# Patient Record
Sex: Male | Born: 1945 | Race: Black or African American | Hispanic: No | Marital: Married | Smoking: Never smoker
Health system: Southern US, Community
[De-identification: ages and names within clinical notes are randomized; demographics above are authoritative.]

## PROBLEM LIST (undated history)

## (undated) DIAGNOSIS — I890 Lymphedema, not elsewhere classified: Secondary | ICD-10-CM

## (undated) DIAGNOSIS — N189 Chronic kidney disease, unspecified: Secondary | ICD-10-CM

## (undated) DIAGNOSIS — R252 Cramp and spasm: Secondary | ICD-10-CM

## (undated) DIAGNOSIS — I82409 Acute embolism and thrombosis of unspecified deep veins of unspecified lower extremity: Secondary | ICD-10-CM

## (undated) DIAGNOSIS — G479 Sleep disorder, unspecified: Secondary | ICD-10-CM

## (undated) DIAGNOSIS — I8390 Asymptomatic varicose veins of unspecified lower extremity: Secondary | ICD-10-CM

## (undated) DIAGNOSIS — M7989 Other specified soft tissue disorders: Secondary | ICD-10-CM

## (undated) DIAGNOSIS — D649 Anemia, unspecified: Secondary | ICD-10-CM

## (undated) DIAGNOSIS — I10 Essential (primary) hypertension: Secondary | ICD-10-CM

## (undated) DIAGNOSIS — Z973 Presence of spectacles and contact lenses: Secondary | ICD-10-CM

## (undated) DIAGNOSIS — C61 Malignant neoplasm of prostate: Secondary | ICD-10-CM

## (undated) DIAGNOSIS — M1990 Unspecified osteoarthritis, unspecified site: Secondary | ICD-10-CM

## (undated) HISTORY — PX: VEIN SURGERY: SHX48

## (undated) HISTORY — PX: NASAL SINUS SURGERY: SHX719

## (undated) HISTORY — PX: PROSTATE BIOPSY: SHX241

---

## 2009-06-06 ENCOUNTER — Ambulatory Visit: Payer: Self-pay | Admitting: Vascular Surgery

## 2009-08-07 ENCOUNTER — Ambulatory Visit: Payer: Self-pay | Admitting: Vascular Surgery

## 2010-01-07 ENCOUNTER — Ambulatory Visit: Payer: Self-pay | Admitting: Vascular Surgery

## 2010-01-07 ENCOUNTER — Encounter (INDEPENDENT_AMBULATORY_CARE_PROVIDER_SITE_OTHER): Payer: Self-pay | Admitting: Emergency Medicine

## 2010-01-07 ENCOUNTER — Emergency Department (HOSPITAL_COMMUNITY): Admission: EM | Admit: 2010-01-07 | Discharge: 2010-01-07 | Payer: Self-pay | Admitting: Emergency Medicine

## 2010-03-14 ENCOUNTER — Ambulatory Visit: Payer: Self-pay | Admitting: Surgery

## 2010-05-19 HISTORY — PX: HERNIA REPAIR: SHX51

## 2010-08-02 LAB — BASIC METABOLIC PANEL
CO2: 29 mEq/L (ref 19–32)
Calcium: 9.6 mg/dL (ref 8.4–10.5)
Creatinine, Ser: 1.31 mg/dL (ref 0.4–1.5)
GFR calc Af Amer: >60 mL/min (ref >60)

## 2010-08-02 LAB — DIFFERENTIAL
Basophils Relative: 0 % (ref 0–1)
Eosinophils Absolute: 0 10*3/uL (ref 0.0–0.7)
Lymphs Abs: 1.6 10*3/uL (ref 0.7–4.0)
Neutrophils Relative %: 72 % (ref 43–77)

## 2010-08-02 LAB — PROTIME-INR
INR: 2.62 — ABNORMAL HIGH (ref 0.00–1.49)
Prothrombin Time: 28.1 seconds — ABNORMAL HIGH (ref 11.6–15.2)

## 2010-08-02 LAB — CBC
MCH: 29.6 pg (ref 26.0–34.0)
Platelets: 138 10*3/uL — ABNORMAL LOW (ref 150–400)
RBC: 4.32 MIL/uL (ref 4.22–5.81)

## 2010-10-01 NOTE — Procedures (Signed)
DUPLEX DEEP VENOUS EXAM - LOWER EXTREMITY   INDICATION:  Right lower extremity pain and swelling with known history  of DVT.   HISTORY:  Edema:  Right lower extremity intermittently.  Trauma/Surgery:  Not recently, history of right varicose vein stripping,  per patient.  Pain:  Right lower extremity aching intermittently.  PE:  No.  Previous DVT:  Right popliteal vein DVT on 06/06/2009.  Anticoagulants:  Coumadin.  Other:   DUPLEX EXAM:                CFV   SFV   PopV  PTV    GSV                R  L  R  L  R  L  R   L  R   L  Thrombosis    o  o  o     +     o      N/A  Spontaneous   +  +  +     +     +      N/A  Phasic        +  +  +     +     +      N/A  Augmentation  +  +  +     p     +      N/A  Compressible  +  +  +     p     +      N/A  Competent     +  +  +     0     +      N/A   Legend:  + - yes  o - no  p - partial  D - decreased   IMPRESSION:  1. 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  2. All other imaged deep veins appear patent.   Called the office of Dr. Bascom Levels; however, was closed for lunch, so  preliminary report was faxed.    _____________________________  Quita Skye Hart [place], M.D.   AS/MEDQ  D:  08/07/2009  T:  08/08/2009  Job:  [id number]

## 2010-10-01 NOTE — Procedures (Signed)
DUPLEX DEEP VENOUS EXAM - LOWER EXTREMITY   INDICATION:  Right lower extremity pain and edema with history of deep  venous thrombosis.   HISTORY:  Edema:  Right lower extremity.  Trauma/Surgery:  Right lower extremity vein stripping 20 years ago.  Pain:  Yes.  PE:  No.  Previous DVT:  Right popliteal vein DVT in January, 2011.  Anticoagulants:  On Coumadin.  Other:   DUPLEX EXAM:                CFV   SFV   PopV  PTV    GSV                R  L  R  L  R  L  R   L  R  L  Thrombosis    o  o  o     P     o  Spontaneous   +  +  +     +     +  Phasic        +  +  +     D     +  Augmentation  +  +  +     D     +  Compressible  +  +  +     D     +  Competent     +  +  +   Legend:  + - yes  o - no  p - partial  D - decreased   IMPRESSION:  Persistent partial obstruction of the right popliteal vein.  Echogenic thrombus within the lumen suggests chronic, rather than acute,  thrombosis.    _____________________________  V. Charlena Cross, MD   RS/MEDQ  D:  03/14/2010  T:  03/14/2010  Job:  [id number]

## 2010-10-01 NOTE — Procedures (Signed)
DUPLEX DEEP VENOUS EXAM - LOWER EXTREMITY   INDICATION:  Right lower extremity pain and swelling.   HISTORY:  Edema:  Yes.  Trauma/Surgery:  Not current.  Pain:  Pain.  PE:  No.  Previous DVT:  Yes.  Anticoagulants:  Yes.  Other:   DUPLEX EXAM:                CFV   SFV   PopV  PTV    GSV                R  L  R  L  R  L  R   L  R   L  Thrombosis    0  0  0     +     0      NA  Spontaneous   +  +  +     0     +      NA  Phasic        +  +  +     P     +      NA  Augmentation  +  +  +     P     +      NA  Compressible  +  +  +     0     +      NA  Competent     +  +  +     0     +      NA   Legend:  + - yes  o - no  p - partial  D - decreased   IMPRESSION:  Distal popliteal remains uncompressible, with partial flow  and augmentation.  All other veins are compressible, patent, spontaneous  with augmentation.    _____________________________  Janetta Hora Fields, MD   CJ/MEDQ  D:  06/06/2009  T:  06/06/2009  Job:  [id number]

## 2011-02-03 ENCOUNTER — Other Ambulatory Visit (HOSPITAL_COMMUNITY): Payer: Self-pay | Admitting: Urology

## 2011-02-03 DIAGNOSIS — R972 Elevated prostate specific antigen [PSA]: Secondary | ICD-10-CM

## 2011-02-11 ENCOUNTER — Ambulatory Visit (HOSPITAL_COMMUNITY)
Admission: RE | Admit: 2011-02-11 | Discharge: 2011-02-11 | Disposition: A | Discharge status: Home / Self Care | Payer: Medicare Other | Source: Ambulatory Visit | Attending: Urology | Admitting: Urology

## 2011-02-11 DIAGNOSIS — R972 Elevated prostate specific antigen [PSA]: Secondary | ICD-10-CM | POA: Insufficient documentation

## 2011-02-11 DIAGNOSIS — N400 Enlarged prostate without lower urinary tract symptoms: Secondary | ICD-10-CM | POA: Insufficient documentation

## 2011-02-11 LAB — CREATININE, SERUM: GFR calc non Af Amer: >60 mL/min (ref >60)

## 2011-02-11 MED ORDER — GADOBENATE DIMEGLUMINE 529 MG/ML IV SOLN
20.0000 mL | Freq: Once | INTRAVENOUS | Status: AC | PRN
  Administered 2011-02-11: 20 mL via INTRAVENOUS

## 2012-11-10 ENCOUNTER — Encounter (INDEPENDENT_AMBULATORY_CARE_PROVIDER_SITE_OTHER): Payer: Medicare Other

## 2012-11-10 DIAGNOSIS — Z86718 Personal history of other venous thrombosis and embolism: Secondary | ICD-10-CM

## 2012-11-10 DIAGNOSIS — I87009 Postthrombotic syndrome without complications of unspecified extremity: Secondary | ICD-10-CM

## 2012-11-10 DIAGNOSIS — M79609 Pain in unspecified limb: Secondary | ICD-10-CM

## 2012-12-31 ENCOUNTER — Institutional Professional Consult (permissible substitution): Payer: BC Managed Care – PPO | Admitting: Internal Medicine

## 2013-03-04 ENCOUNTER — Encounter: Payer: Self-pay | Admitting: Internal Medicine

## 2013-03-07 ENCOUNTER — Encounter: Payer: Medicare Other | Admitting: Internal Medicine

## 2013-03-07 ENCOUNTER — Encounter: Payer: Self-pay | Admitting: Internal Medicine

## 2013-03-07 NOTE — Progress Notes (Deleted)
Error

## 2013-03-18 NOTE — Progress Notes (Signed)
This encounter was created in error - please disregard.

## 2013-09-16 ENCOUNTER — Other Ambulatory Visit (HOSPITAL_COMMUNITY): Payer: Self-pay | Admitting: Urology

## 2013-09-16 DIAGNOSIS — C61 Malignant neoplasm of prostate: Secondary | ICD-10-CM

## 2013-09-27 ENCOUNTER — Encounter (HOSPITAL_COMMUNITY)
Admission: RE | Admit: 2013-09-27 | Discharge: 2013-09-27 | Disposition: A | Discharge status: Home / Self Care | Payer: Medicare Other | Source: Ambulatory Visit | Attending: Urology | Admitting: Urology

## 2013-09-27 DIAGNOSIS — C61 Malignant neoplasm of prostate: Secondary | ICD-10-CM | POA: Insufficient documentation

## 2013-09-27 MED ORDER — TECHNETIUM TC 99M MEDRONATE IV KIT
25.0000 | PACK | Freq: Once | INTRAVENOUS | Status: AC | PRN
  Administered 2013-09-27: 25 via INTRAVENOUS

## 2013-09-27 MED ORDER — FLUDEOXYGLUCOSE F - 18 (FDG) INJECTION: 25.0000 | Freq: Once | INTRAVENOUS | Status: DC | PRN

## 2013-11-29 ENCOUNTER — Other Ambulatory Visit: Payer: Self-pay | Admitting: Urology

## 2013-12-19 ENCOUNTER — Encounter (HOSPITAL_COMMUNITY): Payer: Self-pay | Admitting: Pharmacy Technician

## 2013-12-21 ENCOUNTER — Other Ambulatory Visit (HOSPITAL_COMMUNITY): Payer: Self-pay | Admitting: *Deleted

## 2013-12-21 ENCOUNTER — Encounter (HOSPITAL_COMMUNITY)
Admission: RE | Admit: 2013-12-21 | Discharge: 2013-12-21 | Disposition: A | Discharge status: Home / Self Care | Payer: Medicare Other | Source: Ambulatory Visit | Attending: Urology | Admitting: Urology

## 2013-12-21 ENCOUNTER — Ambulatory Visit (HOSPITAL_COMMUNITY)
Admission: RE | Admit: 2013-12-21 | Discharge: 2013-12-21 | Disposition: A | Discharge status: Home / Self Care | Payer: Medicare Other | Source: Ambulatory Visit | Attending: Anesthesiology | Admitting: Anesthesiology

## 2013-12-21 ENCOUNTER — Encounter (HOSPITAL_COMMUNITY): Payer: Self-pay

## 2013-12-21 ENCOUNTER — Encounter (INDEPENDENT_AMBULATORY_CARE_PROVIDER_SITE_OTHER): Payer: Self-pay

## 2013-12-21 DIAGNOSIS — I10 Essential (primary) hypertension: Secondary | ICD-10-CM | POA: Diagnosis not present

## 2013-12-21 DIAGNOSIS — Z01812 Encounter for preprocedural laboratory examination: Secondary | ICD-10-CM | POA: Insufficient documentation

## 2013-12-21 DIAGNOSIS — Z01818 Encounter for other preprocedural examination: Secondary | ICD-10-CM | POA: Diagnosis not present

## 2013-12-21 DIAGNOSIS — Z8546 Personal history of malignant neoplasm of prostate: Secondary | ICD-10-CM | POA: Insufficient documentation

## 2013-12-21 DIAGNOSIS — J9819 Other pulmonary collapse: Secondary | ICD-10-CM | POA: Insufficient documentation

## 2013-12-21 HISTORY — DX: Malignant neoplasm of prostate: C61

## 2013-12-21 HISTORY — DX: Other specified soft tissue disorders: M79.89

## 2013-12-21 HISTORY — DX: Sleep disorder, unspecified: G47.9

## 2013-12-21 HISTORY — DX: Acute embolism and thrombosis of unspecified deep veins of unspecified lower extremity: I82.409

## 2013-12-21 HISTORY — DX: Essential (primary) hypertension: I10

## 2013-12-21 HISTORY — DX: Cramp and spasm: R25.2

## 2013-12-21 HISTORY — DX: Asymptomatic varicose veins of unspecified lower extremity: I83.90

## 2013-12-21 LAB — APTT: APTT: 35 s (ref 24–37)

## 2013-12-21 LAB — CBC
HCT: 38.2 % — ABNORMAL LOW (ref 39.0–52.0)
Hemoglobin: 12.2 g/dL — ABNORMAL LOW (ref 13.0–17.0)
MCH: 28.4 pg (ref 26.0–34.0)
MCHC: 31.9 g/dL (ref 30.0–36.0)
MCV: 89 fL (ref 78.0–100.0)
PLATELETS: 125 10*3/uL — AB (ref 150–400)
RBC: 4.29 MIL/uL (ref 4.22–5.81)
RDW: 15.2 % (ref 11.5–15.5)
WBC: 5.9 10*3/uL (ref 4.0–10.5)

## 2013-12-21 LAB — BASIC METABOLIC PANEL
ANION GAP: 10 (ref 5–15)
BUN: 23 mg/dL (ref 6–23)
CALCIUM: 9.8 mg/dL (ref 8.4–10.5)
CO2: 26 mEq/L (ref 19–32)
CREATININE: 1.2 mg/dL (ref 0.50–1.35)
Chloride: 103 mEq/L (ref 96–112)
GFR, EST AFRICAN AMERICAN: 69 mL/min — AB (ref >90)
GFR, EST NON AFRICAN AMERICAN: 60 mL/min — AB (ref >90)
Glucose, Bld: 97 mg/dL (ref 70–99)
Potassium: 4.6 mEq/L (ref 3.7–5.3)
SODIUM: 139 meq/L (ref 137–147)

## 2013-12-21 LAB — PROTIME-INR
INR: 2.58 — AB (ref 0.00–1.49)
PROTHROMBIN TIME: 27.7 s — AB (ref 11.6–15.2)

## 2013-12-21 NOTE — Progress Notes (Signed)
12/21/13 1322  OBSTRUCTIVE SLEEP APNEA  Have you ever been diagnosed with sleep apnea through a sleep study? No  Do you snore loudly (loud enough to be heard through closed doors)?  1  Do you often feel tired, fatigued, or sleepy during the daytime? 1  Has anyone observed you stop breathing during your sleep? 0  Do you have, or are you being treated for high blood pressure? 1  BMI more than 35 kg/m2? 0  Age over 70 years old? 1  Neck circumference greater than 40 cm/16 inches? 1  Gender: 1  Obstructive Sleep Apnea Score 6  Score 4 or greater  Results sent to PCP

## 2013-12-21 NOTE — Patient Instructions (Addendum)
Yair Dusza  12/21/2013                           YOUR PROCEDURE IS SCHEDULED ON: 12/30/13               ENTER THRU [facility] ENTRANCE AND                          FOLLOW  SIGNS TO SHORT STAY CENTER                 ARRIVE AT SHORT STAY AT: 5:30 AM               CALL THIS NUMBER IF ANY PROBLEMS THE DAY OF SURGERY :               [phone]                                REMEMBER:   Do not eat food or drink liquids AFTER MIDNIGHT                  Take these medicines the morning of surgery with               A SIPS OF WATER : NONE        Do not wear jewelry, make-up   Do not wear lotions, powders, or perfumes.   Do not shave legs or underarms 12 hrs. before surgery (men may shave face)  Do not bring valuables to the hospital.  Contacts, dentures or bridgework may not be worn into surgery.  Leave suitcase in the car. After surgery it may be brought to your room.  For patients admitted to the hospital more than one night, checkout time is            11:00 AM                                                      ________________________________________________________________________                                                                        [place]  Before surgery, you can play an important role.  Because skin is not sterile, your skin needs to be as free of germs as possible.  You can reduce the number of germs on your skin by washing with CHG (chlorahexidine gluconate) soap before surgery.  CHG is an antiseptic cleaner which kills germs and bonds with the skin to continue killing germs even after washing. Please DO NOT use if you have an allergy to CHG or antibacterial soaps.  If your skin becomes reddened/irritated stop using the CHG and inform your nurse when you arrive at Short Stay. Do not shave (including legs and underarms) for at least 48 hours prior to the first CHG shower.  You may shave your  face. Please follow these instructions  carefully:   1.  Shower with CHG Soap the night before surgery and the  morning of Surgery.   2.  If you choose to wash your hair, wash your hair first as usual with your  normal  Shampoo.   3.  After you shampoo, rinse your hair and body thoroughly to remove the  shampoo.                                         4.  Use CHG as you would any other liquid soap.  You can apply chg directly  to the skin and wash . Gently wash with scrungie or clean wascloth    5.  Apply the CHG Soap to your body ONLY FROM THE NECK DOWN.   Do not use on open                           Wound or open sores. Avoid contact with eyes, ears mouth and genitals (private parts).                        Genitals (private parts) with your normal soap.              6.  Wash thoroughly, paying special attention to the area where your surgery  will be performed.   7.  Thoroughly rinse your body with warm water from the neck down.   8.  DO NOT shower/wash with your normal soap after using and rinsing off  the CHG Soap .                9.  Pat yourself dry with a clean towel.             10.  Wear clean pajamas.             11.  Place clean sheets on your bed the night of your first shower and do not  sleep with pets.  Day of Surgery : Do not apply any lotions/deodorants the morning of surgery.  Please wear clean clothes to the hospital/surgery center.  FAILURE TO FOLLOW THESE INSTRUCTIONS MAY RESULT IN THE CANCELLATION OF YOUR SURGERY    PATIENT SIGNATURE_________________________________  ______________________________________________________________________    WHAT IS A BLOOD TRANSFUSION? Blood Transfusion Information  A transfusion is the replacement of blood or some of its parts. Blood is made up of multiple cells which provide different functions.  Red blood cells carry oxygen and are used for blood loss replacement.  White blood cells fight against  infection.  Platelets control bleeding.  Plasma helps clot blood.  Other blood products are available for specialized needs, such as hemophilia or other clotting disorders. BEFORE THE TRANSFUSION  Who gives blood for transfusions?   Healthy volunteers who are fully evaluated to make sure their blood is safe. This is blood bank blood. Transfusion therapy is the safest it has ever been in the practice of medicine. Before blood is taken from a donor, a complete history is taken to make sure that person has no history of diseases nor engages in risky social behavior (examples are intravenous drug use or sexual activity with multiple partners). The donor's travel history is screened to minimize risk of transmitting infections, such as malaria. The donated blood is tested for signs of infectious diseases, such as HIV  and hepatitis. The blood is then tested to be sure it is compatible with you in order to minimize the chance of a transfusion reaction. If you or a relative donates blood, this is often done in anticipation of surgery and is not appropriate for emergency situations. It takes many days to process the donated blood. RISKS AND COMPLICATIONS Although transfusion therapy is very safe and saves many lives, the main dangers of transfusion include:   Getting an infectious disease.  Developing a transfusion reaction. This is an allergic reaction to something in the blood you were given. Every precaution is taken to prevent this. The decision to have a blood transfusion has been considered carefully by your caregiver before blood is given. Blood is not given unless the benefits outweigh the risks. AFTER THE TRANSFUSION  Right after receiving a blood transfusion, you will usually feel much better and more energetic. This is especially true if your red blood cells have gotten low (anemic). The transfusion raises the level of the red blood cells which carry oxygen, and this usually causes an energy  increase.  The nurse administering the transfusion will monitor you carefully for complications. HOME CARE INSTRUCTIONS  No special instructions are needed after a transfusion. You may find your energy is better. Speak with your caregiver about any limitations on activity for underlying diseases you may have. SEEK MEDICAL CARE IF:   Your condition is not improving after your transfusion.  You develop redness or irritation at the intravenous (IV) site. SEEK IMMEDIATE MEDICAL CARE IF:  Any of the following symptoms occur over the next 12 hours:  Shaking chills.  You have a temperature by mouth above 102 F (38.9 C), not controlled by medicine.  Chest, back, or muscle pain.  People around you feel you are not acting correctly or are confused.  Shortness of breath or difficulty breathing.  Dizziness and fainting.  You get a rash or develop hives.  You have a decrease in urine output.  Your urine turns a dark color or changes to pink, red, or brown. Any of the following symptoms occur over the next 10 days:  You have a temperature by mouth above 102 F (38.9 C), not controlled by medicine.  Shortness of breath.  Weakness after normal activity.  The white part of the eye turns yellow (jaundice).  You have a decrease in the amount of urine or are urinating less often.  Your urine turns a dark color or changes to pink, red, or brown. Document Released: 05/02/2000 Document Revised: 07/28/2011 Document Reviewed: 12/20/2007 [facility] Patient Information 2014 [place], [place].  _______________________________________________________________________

## 2013-12-22 NOTE — Progress Notes (Signed)
CBC faxed to Dr. Tresa Moore

## 2013-12-29 NOTE — Anesthesia Preprocedure Evaluation (Signed)
Anesthesia Evaluation  Patient identified by MRN, date of birth, ID band Patient awake    Reviewed: Allergy & Precautions, H&P , NPO status , Patient's Chart, lab work & pertinent test results  Airway Mallampati: II TM Distance: >3 FB Neck ROM: Full    Dental no notable dental hx.    Pulmonary neg pulmonary ROS,  breath sounds clear to auscultation  Pulmonary exam normal       Cardiovascular hypertension, Pt. on medications Rhythm:Regular Rate:Normal     Neuro/Psych negative neurological ROS  negative psych ROS   GI/Hepatic negative GI ROS, Neg liver ROS,   Endo/Other  negative endocrine ROS  Renal/GU negative Renal ROS     Musculoskeletal negative musculoskeletal ROS (+)   Abdominal   Peds  Hematology negative hematology ROS (+)   Anesthesia Other Findings   Reproductive/Obstetrics                           Anesthesia Physical Anesthesia Plan  ASA: II  Anesthesia Plan: General   Post-op Pain Management:    Induction: Intravenous  Airway Management Planned: Oral ETT  Additional Equipment:   Intra-op Plan:   Post-operative Plan: Extubation in OR  Informed Consent: I have reviewed the patients History and Physical, chart, labs and discussed the procedure including the risks, benefits and alternatives for the proposed anesthesia with the patient or authorized representative who has indicated his/her understanding and acceptance.   Dental advisory given  Plan Discussed with: CRNA  Anesthesia Plan Comments:         Anesthesia Quick Evaluation

## 2013-12-30 ENCOUNTER — Inpatient Hospital Stay (HOSPITAL_COMMUNITY): Payer: Medicare Other | Admitting: Anesthesiology

## 2013-12-30 ENCOUNTER — Encounter (HOSPITAL_COMMUNITY): Payer: Self-pay | Admitting: Anesthesiology

## 2013-12-30 ENCOUNTER — Encounter (HOSPITAL_COMMUNITY)
Admission: RE | Disposition: A | Discharge status: Home / Self Care | Payer: Self-pay | Source: Ambulatory Visit | Attending: Urology

## 2013-12-30 ENCOUNTER — Encounter (HOSPITAL_COMMUNITY): Payer: Medicare Other | Admitting: Anesthesiology

## 2013-12-30 ENCOUNTER — Inpatient Hospital Stay (HOSPITAL_COMMUNITY)
Admission: RE | Admit: 2013-12-30 | Discharge: 2014-01-02 | DRG: 707 | Disposition: A | Discharge status: Home / Self Care | Payer: Medicare Other | Source: Ambulatory Visit | Attending: Urology | Admitting: Urology

## 2013-12-30 DIAGNOSIS — D62 Acute posthemorrhagic anemia: Secondary | ICD-10-CM | POA: Diagnosis not present

## 2013-12-30 DIAGNOSIS — Z86718 Personal history of other venous thrombosis and embolism: Secondary | ICD-10-CM | POA: Diagnosis not present

## 2013-12-30 DIAGNOSIS — D696 Thrombocytopenia, unspecified: Secondary | ICD-10-CM | POA: Diagnosis present

## 2013-12-30 DIAGNOSIS — M62838 Other muscle spasm: Secondary | ICD-10-CM | POA: Diagnosis not present

## 2013-12-30 DIAGNOSIS — R609 Edema, unspecified: Secondary | ICD-10-CM | POA: Diagnosis present

## 2013-12-30 DIAGNOSIS — C61 Malignant neoplasm of prostate: Secondary | ICD-10-CM | POA: Diagnosis present

## 2013-12-30 DIAGNOSIS — K4090 Unilateral inguinal hernia, without obstruction or gangrene, not specified as recurrent: Secondary | ICD-10-CM | POA: Diagnosis present

## 2013-12-30 DIAGNOSIS — E860 Dehydration: Secondary | ICD-10-CM | POA: Diagnosis present

## 2013-12-30 DIAGNOSIS — N179 Acute kidney failure, unspecified: Secondary | ICD-10-CM | POA: Diagnosis not present

## 2013-12-30 DIAGNOSIS — I10 Essential (primary) hypertension: Secondary | ICD-10-CM | POA: Diagnosis present

## 2013-12-30 HISTORY — PX: LYMPHADENECTOMY: SHX5960

## 2013-12-30 HISTORY — PX: ROBOT ASSISTED LAPAROSCOPIC RADICAL PROSTATECTOMY: SHX5141

## 2013-12-30 LAB — HEMOGLOBIN AND HEMATOCRIT, BLOOD
HCT: 35.8 % — ABNORMAL LOW (ref 39.0–52.0)
HCT: 30 % — ABNORMAL LOW (ref 39.0–52.0)
HEMOGLOBIN: 11.3 g/dL — AB (ref 13.0–17.0)
Hemoglobin: 9.7 g/dL — ABNORMAL LOW (ref 13.0–17.0)

## 2013-12-30 LAB — BASIC METABOLIC PANEL
ANION GAP: 11 (ref 5–15)
BUN: 21 mg/dL (ref 6–23)
CALCIUM: 9 mg/dL (ref 8.4–10.5)
CO2: 25 meq/L (ref 19–32)
Chloride: 103 mEq/L (ref 96–112)
Creatinine, Ser: 1.26 mg/dL (ref 0.50–1.35)
GFR calc Af Amer: 65 mL/min — ABNORMAL LOW (ref >90)
GFR, EST NON AFRICAN AMERICAN: 57 mL/min — AB (ref >90)
Glucose, Bld: 145 mg/dL — ABNORMAL HIGH (ref 70–99)
Potassium: 4.2 mEq/L (ref 3.7–5.3)
SODIUM: 139 meq/L (ref 137–147)

## 2013-12-30 LAB — TYPE AND SCREEN
ABO/RH(D): A POS
Antibody Screen: NEGATIVE

## 2013-12-30 LAB — PROTIME-INR
INR: 1.05 (ref 0.00–1.49)
Prothrombin Time: 13.7 seconds (ref 11.6–15.2)

## 2013-12-30 LAB — ABO/RH: ABO/RH(D): A POS

## 2013-12-30 SURGERY — ROBOTIC ASSISTED LAPAROSCOPIC RADICAL PROSTATECTOMY
Anesthesia: General

## 2013-12-30 MED ORDER — CEFAZOLIN SODIUM-DEXTROSE 2-3 GM-% IV SOLR
INTRAVENOUS | Status: AC
  Filled 2013-12-30: qty 50

## 2013-12-30 MED ORDER — NEOSTIGMINE METHYLSULFATE 10 MG/10ML IV SOLN
INTRAVENOUS | Status: AC
  Filled 2013-12-30: qty 1

## 2013-12-30 MED ORDER — DIPHENHYDRAMINE HCL 50 MG/ML IJ SOLN: 12.5000 mg | Freq: Four times a day (QID) | INTRAMUSCULAR | Status: DC | PRN

## 2013-12-30 MED ORDER — SODIUM CHLORIDE 0.9 % IJ SOLN
3.0000 mL | Freq: Two times a day (BID) | INTRAMUSCULAR | Status: DC
  Administered 2013-12-31 – 2014-01-01 (×3): 3 mL via INTRAVENOUS

## 2013-12-30 MED ORDER — PROPOFOL 10 MG/ML IV BOLUS
INTRAVENOUS | Status: AC
  Filled 2013-12-30: qty 20

## 2013-12-30 MED ORDER — MORPHINE SULFATE 2 MG/ML IJ SOLN: 2.0000 mg | INTRAMUSCULAR | Status: DC | PRN

## 2013-12-30 MED ORDER — SUFENTANIL CITRATE 50 MCG/ML IV SOLN
INTRAVENOUS | Status: DC | PRN
  Administered 2013-12-30: 10 ug via INTRAVENOUS
  Administered 2013-12-30: 20 ug via INTRAVENOUS
  Administered 2013-12-30 (×2): 10 ug via INTRAVENOUS

## 2013-12-30 MED ORDER — MEPERIDINE HCL 50 MG/ML IJ SOLN: 6.2500 mg | INTRAMUSCULAR | Status: DC | PRN

## 2013-12-30 MED ORDER — DEXAMETHASONE SODIUM PHOSPHATE 10 MG/ML IJ SOLN
INTRAMUSCULAR | Status: DC | PRN
  Administered 2013-12-30: 10 mg via INTRAVENOUS

## 2013-12-30 MED ORDER — PROPOFOL 10 MG/ML IV BOLUS
INTRAVENOUS | Status: DC | PRN
  Administered 2013-12-30: 200 mg via INTRAVENOUS

## 2013-12-30 MED ORDER — SODIUM CHLORIDE 0.9 % IJ SOLN
INTRAMUSCULAR | Status: AC
  Filled 2013-12-30: qty 10

## 2013-12-30 MED ORDER — OXYBUTYNIN CHLORIDE 5 MG PO TABS
5.0000 mg | ORAL_TABLET | Freq: Three times a day (TID) | ORAL | Status: DC | PRN
  Filled 2013-12-30: qty 1

## 2013-12-30 MED ORDER — ONDANSETRON HCL 4 MG/2ML IJ SOLN
INTRAMUSCULAR | Status: AC
  Filled 2013-12-30: qty 4

## 2013-12-30 MED ORDER — ACETAMINOPHEN 325 MG PO TABS: 650.0000 mg | ORAL_TABLET | ORAL | Status: DC | PRN

## 2013-12-30 MED ORDER — MIDAZOLAM HCL 5 MG/5ML IJ SOLN
INTRAMUSCULAR | Status: DC | PRN
  Administered 2013-12-30: 2 mg via INTRAVENOUS

## 2013-12-30 MED ORDER — SUFENTANIL CITRATE 50 MCG/ML IV SOLN
INTRAVENOUS | Status: AC
  Filled 2013-12-30: qty 1

## 2013-12-30 MED ORDER — HYDROMORPHONE HCL PF 1 MG/ML IJ SOLN
INTRAMUSCULAR | Status: DC | PRN
  Administered 2013-12-30 (×5): .4 mg via INTRAVENOUS

## 2013-12-30 MED ORDER — SODIUM CHLORIDE 0.9 % IJ SOLN: 3.0000 mL | INTRAMUSCULAR | Status: DC | PRN

## 2013-12-30 MED ORDER — GLYCOPYRROLATE 0.2 MG/ML IJ SOLN
INTRAMUSCULAR | Status: DC | PRN
  Administered 2013-12-30: .6 mg via INTRAVENOUS

## 2013-12-30 MED ORDER — CEFAZOLIN SODIUM-DEXTROSE 2-3 GM-% IV SOLR
2.0000 g | INTRAVENOUS | Status: AC
  Administered 2013-12-30: 2 g via INTRAVENOUS

## 2013-12-30 MED ORDER — KETOROLAC TROMETHAMINE 15 MG/ML IJ SOLN
15.0000 mg | Freq: Four times a day (QID) | INTRAMUSCULAR | Status: DC
  Administered 2013-12-30 – 2013-12-31 (×3): 15 mg via INTRAVENOUS
  Filled 2013-12-30 (×5): qty 1

## 2013-12-30 MED ORDER — BUPIVACAINE LIPOSOME 1.3 % IJ SUSP
INTRAMUSCULAR | Status: DC | PRN
  Administered 2013-12-30: 20 mL

## 2013-12-30 MED ORDER — GLYCOPYRROLATE 0.2 MG/ML IJ SOLN
INTRAMUSCULAR | Status: AC
  Filled 2013-12-30: qty 3

## 2013-12-30 MED ORDER — CISATRACURIUM BESYLATE 20 MG/10ML IV SOLN
INTRAVENOUS | Status: AC
  Filled 2013-12-30: qty 10

## 2013-12-30 MED ORDER — HYDROMORPHONE HCL PF 1 MG/ML IJ SOLN: 0.2500 mg | INTRAMUSCULAR | Status: DC | PRN

## 2013-12-30 MED ORDER — CISATRACURIUM BESYLATE (PF) 10 MG/5ML IV SOLN
INTRAVENOUS | Status: DC | PRN
  Administered 2013-12-30: 6 mg via INTRAVENOUS
  Administered 2013-12-30: 2 mg via INTRAVENOUS
  Administered 2013-12-30: 6 mg via INTRAVENOUS
  Administered 2013-12-30: 14 mg via INTRAVENOUS

## 2013-12-30 MED ORDER — DEXTROSE 5 % IV SOLN
2.0000 mg | INTRAVENOUS | Status: DC | PRN
  Administered 2013-12-30: 2 mg via TOPICAL

## 2013-12-30 MED ORDER — ONDANSETRON HCL 4 MG/2ML IJ SOLN
INTRAMUSCULAR | Status: DC | PRN
  Administered 2013-12-30: 4 mg via INTRAVENOUS

## 2013-12-30 MED ORDER — OXYCODONE HCL 5 MG PO TABS
5.0000 mg | ORAL_TABLET | Freq: Once | ORAL | Status: DC | PRN
Start: 2013-12-30 — End: 2013-12-30

## 2013-12-30 MED ORDER — CEFAZOLIN SODIUM-DEXTROSE 2-3 GM-% IV SOLR
2.0000 g | Freq: Three times a day (TID) | INTRAVENOUS | Status: AC
  Administered 2013-12-30 (×2): 2 g via INTRAVENOUS
  Filled 2013-12-30 (×2): qty 50

## 2013-12-30 MED ORDER — KCL IN DEXTROSE-NACL 20-5-0.45 MEQ/L-%-% IV SOLN
INTRAVENOUS | Status: DC
  Administered 2013-12-30: 17:00:00 via INTRAVENOUS
  Administered 2013-12-30: 75 mL/h via INTRAVENOUS
  Administered 2013-12-30 (×2): via INTRAVENOUS
  Filled 2013-12-30 (×4): qty 1000

## 2013-12-30 MED ORDER — SODIUM CHLORIDE 0.9 % IV SOLN: 250.0000 mL | INTRAVENOUS | Status: DC | PRN

## 2013-12-30 MED ORDER — BUPIVACAINE LIPOSOME 1.3 % IJ SUSP
20.0000 mL | Freq: Once | INTRAMUSCULAR | Status: DC
  Filled 2013-12-30: qty 20

## 2013-12-30 MED ORDER — ONDANSETRON HCL 4 MG/2ML IJ SOLN: 4.0000 mg | INTRAMUSCULAR | Status: DC | PRN

## 2013-12-30 MED ORDER — LIDOCAINE HCL (CARDIAC) 20 MG/ML IV SOLN
INTRAVENOUS | Status: AC
  Filled 2013-12-30: qty 5

## 2013-12-30 MED ORDER — DIPHENHYDRAMINE HCL 12.5 MG/5ML PO ELIX: 12.5000 mg | ORAL_SOLUTION | Freq: Four times a day (QID) | ORAL | Status: DC | PRN

## 2013-12-30 MED ORDER — CETYLPYRIDINIUM CHLORIDE 0.05 % MT LIQD
7.0000 mL | Freq: Two times a day (BID) | OROMUCOSAL | Status: DC
  Administered 2013-12-30 – 2014-01-01 (×4): 7 mL via OROMUCOSAL

## 2013-12-30 MED ORDER — NEOSTIGMINE METHYLSULFATE 10 MG/10ML IV SOLN
INTRAVENOUS | Status: DC | PRN
  Administered 2013-12-30: 4 mg via INTRAVENOUS

## 2013-12-30 MED ORDER — SENNOSIDES-DOCUSATE SODIUM 8.6-50 MG PO TABS
2.0000 | ORAL_TABLET | Freq: Every day | ORAL | Status: DC
  Administered 2013-12-30 – 2014-01-01 (×3): 2 via ORAL
  Filled 2013-12-30 (×5): qty 2

## 2013-12-30 MED ORDER — HYDROMORPHONE HCL PF 2 MG/ML IJ SOLN
INTRAMUSCULAR | Status: AC
  Filled 2013-12-30: qty 1

## 2013-12-30 MED ORDER — PROMETHAZINE HCL 25 MG/ML IJ SOLN: 6.2500 mg | INTRAMUSCULAR | Status: DC | PRN

## 2013-12-30 MED ORDER — SODIUM CHLORIDE 0.9 % IJ SOLN
INTRAMUSCULAR | Status: AC
  Filled 2013-12-30: qty 20

## 2013-12-30 MED ORDER — LACTATED RINGERS IV SOLN
INTRAVENOUS | Status: DC | PRN
  Administered 2013-12-30 (×3): via INTRAVENOUS

## 2013-12-30 MED ORDER — OXYCODONE HCL 5 MG/5ML PO SOLN
5.0000 mg | Freq: Once | ORAL | Status: DC | PRN
  Filled 2013-12-30: qty 5

## 2013-12-30 MED ORDER — SUCCINYLCHOLINE CHLORIDE 20 MG/ML IJ SOLN
INTRAMUSCULAR | Status: DC | PRN
  Administered 2013-12-30: 100 mg via INTRAVENOUS

## 2013-12-30 MED ORDER — EPHEDRINE SULFATE 50 MG/ML IJ SOLN
INTRAMUSCULAR | Status: DC | PRN
  Administered 2013-12-30 (×2): 5 mg via INTRAVENOUS
  Administered 2013-12-30: 10 mg via INTRAVENOUS

## 2013-12-30 MED ORDER — SODIUM CHLORIDE 0.9 % IV BOLUS (SEPSIS)
1000.0000 mL | Freq: Once | INTRAVENOUS | Status: AC
  Administered 2013-12-30: 1000 mL via INTRAVENOUS

## 2013-12-30 MED ORDER — HYDROCODONE-ACETAMINOPHEN 5-325 MG PO TABS
1.0000 | ORAL_TABLET | ORAL | Status: DC | PRN
  Administered 2013-12-30 – 2013-12-31 (×2): 2 via ORAL
  Filled 2013-12-30 (×2): qty 2

## 2013-12-30 MED ORDER — LIDOCAINE HCL (CARDIAC) 20 MG/ML IV SOLN
INTRAVENOUS | Status: DC | PRN
  Administered 2013-12-30: 100 mg via INTRAVENOUS

## 2013-12-30 MED ORDER — MIDAZOLAM HCL 2 MG/2ML IJ SOLN
INTRAMUSCULAR | Status: AC
  Filled 2013-12-30: qty 2

## 2013-12-30 MED ORDER — LACTATED RINGERS IR SOLN
Status: DC | PRN
  Administered 2013-12-30: 100 mL

## 2013-12-30 MED ORDER — KCL IN DEXTROSE-NACL 20-5-0.45 MEQ/L-%-% IV SOLN
INTRAVENOUS | Status: AC
  Filled 2013-12-30: qty 1000

## 2013-12-30 SURGICAL SUPPLY — 62 items
BANDAGE ESMARK 6X9 LF (GAUZE/BANDAGES/DRESSINGS) ×2 IMPLANT
BNDG ESMARK 6X9 LF (GAUZE/BANDAGES/DRESSINGS) ×4
CABLE HIGH FREQUENCY MONO STRZ (ELECTRODE) ×4 IMPLANT
CANISTER SUCTION 2500CC (MISCELLANEOUS) IMPLANT
CATH FOLEY 2WAY SLVR 18FR 30CC (CATHETERS) IMPLANT
CATH ROBINSON RED A/P 16FR (CATHETERS) IMPLANT
CATH TIEMANN FOLEY 18FR 5CC (CATHETERS) ×8 IMPLANT
CHLORAPREP W/TINT 26ML (MISCELLANEOUS) ×4 IMPLANT
CLIP LIGATING HEM O LOK PURPLE (MISCELLANEOUS) ×16 IMPLANT
CLIP LIGATING HEMO LOK XL GOLD (MISCELLANEOUS) IMPLANT
CLOTH BEACON ORANGE TIMEOUT ST (SAFETY) ×4 IMPLANT
COVER MAYO STAND STRL (DRAPES) IMPLANT
COVER SURGICAL LIGHT HANDLE (MISCELLANEOUS) ×4 IMPLANT
COVER TIP SHEARS 8 DVNC (MISCELLANEOUS) ×2 IMPLANT
COVER TIP SHEARS 8MM DA VINCI (MISCELLANEOUS) ×2
CUTTER ECHEON FLEX ENDO 45 340 (ENDOMECHANICALS) ×4 IMPLANT
DECANTER SPIKE VIAL GLASS SM (MISCELLANEOUS) ×4 IMPLANT
DERMABOND ADVANCED (GAUZE/BANDAGES/DRESSINGS) ×2
DERMABOND ADVANCED .7 DNX12 (GAUZE/BANDAGES/DRESSINGS) ×2 IMPLANT
DRAPE ARM DVNC X/XI (DISPOSABLE) IMPLANT
DRAPE COLUMN DVNC XI (DISPOSABLE) IMPLANT
DRAPE DA VINCI XI ARM (DISPOSABLE)
DRAPE DA VINCI XI COLUMN (DISPOSABLE)
DRAPE LAPAROSCOPIC ABDOMINAL (DRAPES) IMPLANT
DRSG TEGADERM 4X4.75 (GAUZE/BANDAGES/DRESSINGS) ×4 IMPLANT
DRSG TEGADERM 6X8 (GAUZE/BANDAGES/DRESSINGS) IMPLANT
ELECT REM PT RETURN 9FT ADLT (ELECTROSURGICAL) ×4
ELECTRODE REM PT RTRN 9FT ADLT (ELECTROSURGICAL) ×2 IMPLANT
GAUZE SPONGE 2X2 8PLY STRL LF (GAUZE/BANDAGES/DRESSINGS) IMPLANT
GLOVE BIO SURGEON STRL SZ 6.5 (GLOVE) ×3 IMPLANT
GLOVE BIO SURGEONS STRL SZ 6.5 (GLOVE) ×1
GLOVE BIOGEL M STRL SZ7.5 (GLOVE) ×16 IMPLANT
GLOVE BIOGEL PI IND STRL 7.5 (GLOVE) IMPLANT
GLOVE BIOGEL PI INDICATOR 7.5 (GLOVE)
GOWN STRL REUS W/TWL LRG LVL4 (GOWN DISPOSABLE) ×20 IMPLANT
HOLDER FOLEY CATH W/STRAP (MISCELLANEOUS) ×4 IMPLANT
IV LACTATED RINGERS 1000ML (IV SOLUTION) ×4 IMPLANT
KIT ACCESSORY DA VINCI DISP (KITS) ×2
KIT ACCESSORY DVNC DISP (KITS) ×2 IMPLANT
KIT PROCEDURE DA VINCI SI (MISCELLANEOUS) ×2
KIT PROCEDURE DVNC SI (MISCELLANEOUS) ×2 IMPLANT
MANIFOLD NEPTUNE II (INSTRUMENTS) ×4 IMPLANT
NEEDLE INSUFFLATION 14GA 120MM (NEEDLE) ×4 IMPLANT
NEEDLE SPNL 22GX7 SPINOC (NEEDLE) IMPLANT
PACK ROBOT UROLOGY CUSTOM (CUSTOM PROCEDURE TRAY) ×4 IMPLANT
RELOAD GREEN ECHELON 45 (STAPLE) ×4 IMPLANT
SEAL CANN UNIV 5-8 DVNC XI (MISCELLANEOUS) IMPLANT
SEAL XI 5MM-8MM UNIVERSAL (MISCELLANEOUS)
SET TUBE IRRIG SUCTION NO TIP (IRRIGATION / IRRIGATOR) ×4 IMPLANT
SOLUTION ELECTROLUBE (MISCELLANEOUS) ×4 IMPLANT
SPONGE GAUZE 2X2 STER 10/PKG (GAUZE/BANDAGES/DRESSINGS)
SPONGE LAP 4X18 X RAY DECT (DISPOSABLE) ×4 IMPLANT
SUT ETHILON 3 0 PS 1 (SUTURE) ×4 IMPLANT
SUT MNCRL AB 4-0 PS2 18 (SUTURE) ×8 IMPLANT
SUT PDS AB 1 CT1 27 (SUTURE) ×8 IMPLANT
SUT VLOC BARB 180 ABS3/0GR12 (SUTURE) ×12
SUTURE VLOC BRB 180 ABS3/0GR12 (SUTURE) ×6 IMPLANT
SYRINGE 1CC 27X.5 TB SAFETYGLD (MISCELLANEOUS) ×4 IMPLANT
TOWEL OR 17X26 10 PK STRL BLUE (TOWEL DISPOSABLE) ×4 IMPLANT
TOWEL OR NON WOVEN STRL DISP B (DISPOSABLE) ×4 IMPLANT
TROCAR 12M 150ML BLUNT (TROCAR) ×4 IMPLANT
WATER STERILE IRR 1500ML POUR (IV SOLUTION) ×8 IMPLANT

## 2013-12-30 NOTE — Op Note (Signed)
[name], SMELTZ NO.:  [id number]  MEDICAL RECORD NO.:  [id number]  LOCATION:  4193                         FACILITY:  [facility]  PHYSICIAN:  Alexis Frock, MD     DATE OF BIRTH:  09-Sep-1945  DATE OF PROCEDURE: 12/30/2013 DATE OF DISCHARGE:                              OPERATIVE REPORT   ASSISTANT SURGEON:  Charlton Haws, M.D.  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  PROCEDURE:  Robotic-assisted laparoscopic radical prostatectomy with bilateral pelvic lymphadenectomy.  ESTIMATED BLOOD LOSS:  100 mL.  COMPLICATIONS:  None.  SPECIMENS: 1. Radical prostatectomy. 2. Periprostatic fat. 3. Right external iliac lymph nodes. 4. Right obturator lymph nodes. 5. Left external iliac lymph nodes. 6. Left obturator lymph nodes.  FINDINGS:  Nonvisualization of sentinel drainage with ICG administration.  INDICATION:  [name] is a pleasant 68 year old gentleman who was found on workup of elevated PSA to have high-risk prostate cancer, PSA of 20.  He underwent staging imaging with CT and bone scan that was unrevealing for locally advanced or distant disease.  Options were discussed for definitive management, and he wished to proceed with prostatectomy with minimally invasive assistance.  Informed consent was obtained and placed in the medical record.  PROCEDURE IN DETAIL:  The patient being [name], was verified. Procedure being robotic radical prostatectomy was confirmed.  Procedure was carried out.  Time-out was performed.  Intravenous antibiotics were administered.  General endotracheal anesthesia was introduced.  The patient was placed into steep Trendelenburg position implying approximately 20 degrees incline after he was further fashioned on the operative table using a pink pad on his back to reduce the risk of sliding on the table.  His arms were appropriately padded as well as his lateral knees and sequential compression devices were  verified functioning and sterile field was created prepping and draping the patient's infraxiphoid abdomen using chlorhexidine gluconate x3 in his penis and perineum using Betadine.  A high-flow low-pressure pneumoperitoneum was obtained using Veress technique in the infraumbilical midline having passed the aspiration and drop test.  A 12- mm robotic camera port was placed in the same location.  Laparoscopic examination of the peritoneal cavity revealed no dense adhesions and no visceral injury.  Additional ports were then placed as follows; right paramedian 8-mm robotic port, right far lateral 12-mm assistant port, left paramedian 8-mm robotic port, left far lateral 8-mm robotic port, also a 5-mm paramedian assistant port.  Robot was docked and passed through electronic checks.  Initial attention was directed at lateral dissection.  Incision was made lateral to the left medial umbilical ligament from the midline towards the area of the internal ring and coursing along the iliac vessels and the lateral bladder was carefully swept away from the pelvic sidewall.  The vas deferens was found and ligated, and also uses a bucket-handle to aid in this dissection.  This was performed down at the endopelvic fascia on the left side.  A mirror image dissection was performed on the right side, sweeping the right bladder away from the pelvic sidewall and ligating the right vas deferens.  The area of the endopelvic fascia, anterior chest was then taken down using cautery scissors.  This exposed  the anterior base of the prostate, which was then defatted.  The fatty tissues aside labeled periprostatic fat.  The endopelvic fascia was then carefully swept away from the lateral aspect of the prostate from the base to apex orientation, this exposed the dorsal venous complex bilaterally, which was controlled using endovascular stapler, resultant an excellent hemostatic control, dorsal venous complex without  membranous urethral injury.  Next, using the percutaneously placed spinal needle with robotic direct visualization, 0.2 mL of Indocyanine green dye were instilled into each lobe of the prostate until it was expected to be the peripheral zone.  Great care was taken to avoid spillage and this did not occur.  Attention was then directed to the pelvic lymphadenectomy. First on the left side, all fiber fatty tissue and confines of the left external iliac artery and vein pelvic sidewall were carefully mobilized. Lymphostasis was achieved with cold clips.  This was set aside and labeled left external iliac lymph nodes.  These were none, where I sent similarly fiber fatty tissue and confines of the left external iliac vein, obturator nerve, and pelvic sidewall were carefully mobilized. Lymphostasis was achieved with cold clips.  This was set aside, labeled left obturator lymph nodes.  The left hemipelvis was then again inspected under near infrared fluorescence and no obvious lymphatic channels or sentinel nodes were seen.  Next, a mirror image lymphadenectomy was performed on the right side.  Again, another right external iliac lymph node packet and the right arterial lymph node packets, which were sent separately.  Lymphostasis was achieved with cold clips.  Additionally, there were no evidence of hyperfluorescent lymph nodes or obvious lymphatic channels on the right.  Attention was directed to bladder neck dissection and then bladder neck was identified by moving the Foley catheter back and forth, and dissection was proceeded in the anterior-posterior direction, carefully separating the bladder neck from the base of the prostate keeping what appeared to be a rim of the circular bladder neck fibers with the size of each aspect of the dissection.  Posterior dissection was performed by entering the plane of Denonvilliers, dissecting inferiorly and posteriorly. Bilateral vas deferens were  encountered, dissected for distance approximately 4 cm ligated, and placed on gentle superior traction. Next, the seminal vesicles were dissected towards their tips, also placed on gentle superior traction.  The plane of Denonvilliers was further developed in a base-to-apex orientation.  This exposed the vascular pedicles bilaterally.  Given the patient's high-risk disease, purposeful wide dissection was performed of each pedicle using the sequential clipping technique in a base-to-apex orientation.  Next, final antegrade dissection was performed thus separating the membranous urethra, and final posterior attachments and the radical prostatectomy specimen was placed into an EndoCatch bag for later retrieval, and the pelvis was carefully swept clean of any blood, and distal rectal exam was performed with indicator glove and no evidence of rectal violation was found.  Next, posterior dissection was performed using V-Loc suture, reapproximating the posterior urethral plate to the posterior bladder neck and tension-free apposition.  The ureteral orifices were visualized and well away from the area of an anastomosis.  Next, formal urethral to bladder anastomosis was performed using double-armed V-Loc suture from the 6 o'clock to 12 o'clock position and also performing anterior reconstruction by anchoring the stage to the previous puboprostatic ligaments.  A new Foley catheter was easily placed and irrigated quantitatively.  All sponge and needle counts were correct.  The previous right lateral most assistant port site was closed at  the level of fascia using Carter-Thomason suture passer.  Robot was then undocked. Specimen was retrieved by extending the previous camera port site for total distance of approximately 3 cm removing the radical prostatectomy specimen and setting it aside for permanent pathology.  The site was closed at the level of the fascia using figure-of-eight PDS x3 followed by  reapproximation of Scarpa's with running Vicryl.  All incisions sites were infiltrated with dilute lipolyzed Marcaine and closed at the level of the skin using subcuticular Monocryl followed by Dermabond.  Procedure was then terminated.  The patient tolerated the procedure well.  There were no immediate periprocedural complications.  The patient was taken to the postanesthesia care unit in stable condition.          ______________________________ Alexis Frock, MD     TM/MEDQ  D:  12/30/2013  T:  12/30/2013  Job:  [id number]

## 2013-12-30 NOTE — H&P (Signed)
[name] is an 68 y.o. male.    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 Bone scan negative. CT without locally advanced disease. Recent TRUS 39mL and no median lobe. No baseline LUTS.  Does have baseline ED and preserved sexual function not big priority.   PMH sig for left DVT after vein stripping and remains on coumadin, Rt inguinal hernia repair, HTN.  His PCP is Dixie Dials MD.  Today [name] is seen to proceed with robotic prostatectomy as initial therapy for his high risk prostate cancer.   Past Medical History  Diagnosis Date  . Hypertension   . Leg cramps   . Varicose veins     both legs  . DVT (deep venous thrombosis)     rt leg 3 yrs ago  . Swelling of lower extremity     RT LEG  . Prostate cancer   . Difficulty sleeping     Past Surgical History  Procedure Laterality Date  . Nasal sinus surgery    . Vein surgery      RT LEG  . Hernia repair  2012    INGUINAL    No family history on file. Social History:  reports that he has never smoked. He does not have any smokeless tobacco history on file. He reports that he does not drink alcohol or use illicit drugs.  Allergies: No Known Allergies  No prescriptions prior to admission    No results found for this or any previous visit (from the past 48 hour(s)). No results found.  Review of Systems  Constitutional: Negative.  Negative for fever and chills.  HENT: Negative.   Eyes: Negative.   Respiratory: Negative.   Cardiovascular: Negative.   Gastrointestinal: Negative.  Negative for vomiting.  Genitourinary: Negative.   Musculoskeletal: Negative.   Skin: Negative.   Neurological: Negative.   Endo/Heme/Allergies: Negative.   Psychiatric/Behavioral: Negative.     There were no vitals taken for this visit. Physical Exam  Constitutional: He is oriented to person, place, and time. He  appears well-developed.  HENT:  Head: Normocephalic and atraumatic.  Eyes: EOM are normal. Pupils are equal, round, and reactive to light.  Neck: Normal range of motion. Neck supple.  Cardiovascular: Normal rate and regular rhythm.   Respiratory: Effort normal.  GI: Soft. Bowel sounds are normal.  Genitourinary:  No CVAT  Musculoskeletal: Normal range of motion.  Neurological: He is alert and oriented to person, place, and time.  Skin: Skin is warm and dry.  Psychiatric: He has a normal mood and affect. His behavior is normal. Judgment and thought content normal.     Assessment/Plan  1 - High Risk Prostate Cancer -  We rediscussed prostatectomy and specifically robotic prostatectomy with bilateral pelvic lymphadenectomy being the technique that I most commonly perform. I showed the patient on their abdomen the approximately 6 small incision (trocar) sites as well as presumed extraction sites with robotic approach as well as possible open incision sites should open conversion be necessary. We rediscussed peri-operative risks including bleeding, infection, deep vein thrombosis, pulmonary embolism, compartment syndrome, neuropathy / neuropraxia, heart attack, stroke, death, as well as long-term risks such as non-cure / need for additional therapy. We specifically readdressed that the procedure would compromise urinary control leading to stress incontinence which typically resolves with time and pelvic rehabilitation (Kegel's, etc..), but can sometimes be permanent and  require additional therapy including surgery. We also specifically readdressed sexual sequellae including significant erectile dysfunction which typically partially resolves with time but can also be permanent and require additional therapy including surgery.   We rediscussed the typical hospital course including usual 1-2 night hospitalization, discharge with foley catheter in place usually for 1-2 weeks before voiding trial as well  as usually 2 week recovery until able to perform most non-strenuous activity and 6 weeks until able to return to most jobs and more strenuous activity such as exercise.    [name] 12/30/2013, 5:34 AM

## 2013-12-30 NOTE — Transfer of Care (Signed)
Immediate Anesthesia Transfer of Care Note  Patient: [name]  Procedure(s) Performed: Procedure(s): ROBOTIC ASSISTED LAPAROSCOPIC RADICAL PROSTATECTOMY WITH INDOCYANINE GREEN DYE (N/A) PELVIC LYMPH NODE DISSECTION (Bilateral)  Patient Location: PACU  Anesthesia Type:General  Level of Consciousness: awake  Airway & Oxygen Therapy: Patient Spontanous Breathing and Patient connected to face mask oxygen  Post-op Assessment: Report given to PACU RN and Post -op Vital signs reviewed and stable  Post vital signs: Reviewed and stable  Complications: No apparent anesthesia complications

## 2013-12-30 NOTE — Discharge Instructions (Signed)

## 2013-12-30 NOTE — Progress Notes (Signed)
Hgb. And Hct. And B Met drawn by lab. 

## 2013-12-30 NOTE — Brief Op Note (Signed)
12/30/2013  11:15 AM  PATIENT:  [name]  68 y.o. male  PRE-OPERATIVE DIAGNOSIS:  HIGH RISK PROSTATE CANCER  POST-OPERATIVE DIAGNOSIS:  high risk prostate cancer  PROCEDURE:  Procedure(s): ROBOTIC ASSISTED LAPAROSCOPIC RADICAL PROSTATECTOMY WITH INDOCYANINE GREEN DYE (N/A) PELVIC LYMPH NODE DISSECTION (Bilateral)  SURGEON:  Surgeon(s) and Role:    * Alexis Frock, MD - Primary  PHYSICIAN ASSISTANT:   ASSISTANTS: Charlton Haws MD   ANESTHESIA:   local and general  EBL:  Total I/O In: 2000 [I.V.:2000] Out: 100 [Blood:100]  BLOOD ADMINISTERED:none  DRAINS: 1 - foley to straight drain , 2 - JP to bulb suction  LOCAL MEDICATIONS USED:  MARCAINE     SPECIMEN:  Source of Specimen:  1 - prostate, 2- periprostatic fat, 3- bilateral pelvic lymph nodes  DISPOSITION OF SPECIMEN:  PATHOLOGY  COUNTS:  YES  TOURNIQUET:  * No tourniquets in log *  DICTATION: .Other Dictation: Dictation Number [id number]  PLAN OF CARE: Admit to inpatient   PATIENT DISPOSITION:  PACU - hemodynamically stable.   Delay start of Pharmacological VTE agent (>24hrs) due to surgical blood loss or risk of bleeding: yes

## 2013-12-30 NOTE — Progress Notes (Signed)
Hgb. And Hct. And B Met results noted.

## 2013-12-30 NOTE — Anesthesia Postprocedure Evaluation (Signed)
Anesthesia Post Note  Patient: [name]  Procedure(s) Performed: Procedure(s) (LRB): ROBOTIC ASSISTED LAPAROSCOPIC RADICAL PROSTATECTOMY WITH INDOCYANINE GREEN DYE (N/A) PELVIC LYMPH NODE DISSECTION (Bilateral)  Anesthesia type: General  Patient location: PACU  Post pain: Pain level controlled  Post assessment: Post-op Vital signs reviewed  Last Vitals: BP 140/67  Pulse 82  Temp(Src) 36.4 C (Oral)  Resp 14  Ht 6\' 2"  (1.88 m)  Wt 259 lb (117.482 kg)  BMI 33.24 kg/m2  SpO2 98%  Post vital signs: Reviewed  Level of consciousness: sedated  Complications: No apparent anesthesia complications.

## 2013-12-30 NOTE — Anesthesia Procedure Notes (Signed)
Date/Time: 12/30/2013 7:47 AM Performed by: Danley Danker L Patient Re-evaluated:Patient Re-evaluated prior to inductionOxygen Delivery Method: Circle system utilized Preoxygenation: Pre-oxygenation with 100% oxygen Intubation Type: IV induction Ventilation: Mask ventilation without difficulty and Oral airway inserted - appropriate to patient size Laryngoscope Size: Miller and 3 Grade View: Grade I Tube type: Oral Tube size: 8.0 mm Number of attempts: 1 Airway Equipment and Method: Stylet Placement Confirmation: ETT inserted through vocal cords under direct vision,  positive ETCO2 and breath sounds checked- equal and bilateral Secured at: 22 cm Tube secured with: Tape Dental Injury: Teeth and Oropharynx as per pre-operative assessment

## 2013-12-31 LAB — BASIC METABOLIC PANEL
ANION GAP: 13 (ref 5–15)
ANION GAP: 9 (ref 5–15)
BUN: 26 mg/dL — ABNORMAL HIGH (ref 6–23)
BUN: 29 mg/dL — ABNORMAL HIGH (ref 6–23)
CALCIUM: 8.2 mg/dL — AB (ref 8.4–10.5)
CHLORIDE: 100 meq/L (ref 96–112)
CO2: 21 meq/L (ref 19–32)
CO2: 24 mEq/L (ref 19–32)
CREATININE: 1.8 mg/dL — AB (ref 0.50–1.35)
Calcium: 8.4 mg/dL (ref 8.4–10.5)
Chloride: 101 mEq/L (ref 96–112)
Creatinine, Ser: 2.26 mg/dL — ABNORMAL HIGH (ref 0.50–1.35)
GFR calc Af Amer: 43 mL/min — ABNORMAL LOW (ref >90)
GFR calc Af Amer: 32 mL/min — ABNORMAL LOW (ref >90)
GFR calc non Af Amer: 37 mL/min — ABNORMAL LOW (ref >90)
GFR calc non Af Amer: 28 mL/min — ABNORMAL LOW (ref >90)
GLUCOSE: 118 mg/dL — AB (ref 70–99)
GLUCOSE: 114 mg/dL — AB (ref 70–99)
Potassium: 5.2 mEq/L (ref 3.7–5.3)
Potassium: 5.5 mEq/L — ABNORMAL HIGH (ref 3.7–5.3)
SODIUM: 133 meq/L — AB (ref 137–147)
Sodium: 135 mEq/L — ABNORMAL LOW (ref 137–147)

## 2013-12-31 LAB — HEMOGLOBIN AND HEMATOCRIT, BLOOD
HEMATOCRIT: 26.7 % — AB (ref 39.0–52.0)
Hemoglobin: 8.8 g/dL — ABNORMAL LOW (ref 13.0–17.0)

## 2013-12-31 LAB — CBC
HCT: 27.5 % — ABNORMAL LOW (ref 39.0–52.0)
Hemoglobin: 9.1 g/dL — ABNORMAL LOW (ref 13.0–17.0)
MCH: 28.7 pg (ref 26.0–34.0)
MCHC: 33.1 g/dL (ref 30.0–36.0)
MCV: 86.8 fL (ref 78.0–100.0)
PLATELETS: 97 10*3/uL — AB (ref 150–400)
RBC: 3.17 MIL/uL — AB (ref 4.22–5.81)
RDW: 15.3 % (ref 11.5–15.5)
WBC: 10.1 10*3/uL (ref 4.0–10.5)

## 2013-12-31 LAB — CREATININE, FLUID (PLEURAL, PERITONEAL, JP DRAINAGE): Creat, Fluid: 2.2 mg/dL

## 2013-12-31 MED ORDER — DEXTROSE-NACL 5-0.45 % IV SOLN
INTRAVENOUS | Status: DC
  Administered 2013-12-31: 11:00:00 via INTRAVENOUS

## 2013-12-31 NOTE — Progress Notes (Signed)
RN noticed [name] was draining fast. For 4 hours RN recorded 500 ml of blood through the JP. Patient V/S is stable. RN informed M.D.  He ordered H/H and call him back when the results are ready. RN informed about the results. No new orders. Will continue to monitor.

## 2013-12-31 NOTE — Progress Notes (Signed)
1 Day Post-Op Subjective: Yesterday with R flank pain that he interpreted as a muscle spasm, that improved overnight. Tolerating clears without nausea. Ambulating. Pain well controlled.  JP output high overnight. Some dizziness with ambulation, so hgb checked: 9.7 from 11.3. JP output has improved since MN.  Objective: Vital signs in last 24 hours: Temp:  [97.3 F (36.3 C)-97.8 F (36.6 C)] 97.8 F (36.6 C) (08/15 0553) Pulse Rate:  [76-91] 85 (08/15 0553) Resp:  [12-20] 18 (08/15 0553) BP: (115-163)/(55-78) 125/55 mmHg (08/15 0553) SpO2:  [96 %-100 %] 96 % (08/15 0553)  Intake/Output from previous day: 08/14 0701 - 08/15 0700 In: 5493.8 [P.O.:600; I.V.:3843.8; IV Piggyback:1050] Out: 2145 [Urine:975; Drains:1070; Blood:100] Intake/Output this shift: Total I/O In: -  Out: 40 [Drains:40]  Physical Exam:  General: Alert and oriented CV: RRR Lungs: Clear Abdomen: Soft, ND Incisions: CDI. JP: serosang. Foley: thin dark yellow. Ext: NT, No erythema  Lab Results:  Recent Labs  12/30/13 1140 12/30/13 2246 12/31/13 0456  HGB 11.3* 9.7* 9.1*  HCT 35.8* 30.0* 27.5*   BMET  Recent Labs  12/30/13 1140 12/31/13 0456  NA 139 135*  K 4.2 5.2  CL 103 101  CO2 25 21  GLUCOSE 145* 118*  BUN 21 26*  CREATININE 1.26 1.80*  CALCIUM 9.0 8.2*     Studies/Results: No results found.  Assessment/Plan: 17yM POD#1 from RALP w bPLND for PCa. Now with decreasing hgb, increasing cre, and high but improving JP output. Will check JP for creatinine. PM labs. D/c toradol. Reg diet. MIVF. medlock when taking good PO.  Discussed with [name].   LOS: 1 day   [name] 12/31/2013, 9:00 AM   I have performed a history + physical examination on the patient and agree with [name] assesment and plan. Additional findings as per below:  S: 1 - High Risk Prostate Cancer - s/p robotic radical prostatectomy with bilateral pelvic lymphadenectomy 12/30/13. Path pending.    2- Acute Renal Failure - Baseline Cr <1.5. Noted to have Ct to 1.88 8/15 AM labs. On angiotensin receptor blocker at baseline, did receive peri-operative ketorolac x several.   3 - Acute Blood Loss Anemia - Baseling Hgb >13. Noted to have Hgb 9's 8/15. No orthostaisis.   O: Cr 1.8, Hgb 9's SNTND Port sites c/d/i. JP with dark serosanguinous efflux Stable Rt>Lt Le edema that is chronic and unchanged from pre-op  A/P:  1 - High Risk Prostate Cancer - remain in house.   2- Acute Renal Failure - Likely pre-renal / intrisic renal. Recheck BMP later today, Check JP Cr to verify no urine leak. Change to K free fluids.   3 - Acute Blood Loss Anemia - Likely equilibration peri-operateiv.e Recheck H/H today and daily, consider transfusion blood +plts if symptomatic or hgb drops below 7.

## 2014-01-01 LAB — CBC
HCT: 24.7 % — ABNORMAL LOW (ref 39.0–52.0)
Hemoglobin: 7.9 g/dL — ABNORMAL LOW (ref 13.0–17.0)
MCH: 28.2 pg (ref 26.0–34.0)
MCHC: 32 g/dL (ref 30.0–36.0)
MCV: 88.2 fL (ref 78.0–100.0)
PLATELETS: 88 10*3/uL — AB (ref 150–400)
RBC: 2.8 MIL/uL — ABNORMAL LOW (ref 4.22–5.81)
RDW: 15.5 % (ref 11.5–15.5)
WBC: 8.4 10*3/uL (ref 4.0–10.5)

## 2014-01-01 LAB — BASIC METABOLIC PANEL
ANION GAP: 9 (ref 5–15)
BUN: 33 mg/dL — ABNORMAL HIGH (ref 6–23)
CHLORIDE: 100 meq/L (ref 96–112)
CO2: 24 mEq/L (ref 19–32)
Calcium: 8.3 mg/dL — ABNORMAL LOW (ref 8.4–10.5)
Creatinine, Ser: 1.88 mg/dL — ABNORMAL HIGH (ref 0.50–1.35)
GFR calc Af Amer: 40 mL/min — ABNORMAL LOW (ref >90)
GFR calc non Af Amer: 35 mL/min — ABNORMAL LOW (ref >90)
Glucose, Bld: 117 mg/dL — ABNORMAL HIGH (ref 70–99)
POTASSIUM: 4.8 meq/L (ref 3.7–5.3)
Sodium: 133 mEq/L — ABNORMAL LOW (ref 137–147)

## 2014-01-01 LAB — HEMOGLOBIN AND HEMATOCRIT, BLOOD
HEMATOCRIT: 27.1 % — AB (ref 39.0–52.0)
HEMOGLOBIN: 8.8 g/dL — AB (ref 13.0–17.0)

## 2014-01-01 NOTE — Progress Notes (Signed)
2 Days Post-Op Subjective: Tolerating regular diet without nausea. Ambulating. Pain well controlled (one PRN vicodin /24hrs. Passing flatus. No bowel movement. Tolerating foley.  JP output continues to be high (cre = serum on POD#1). No dizziness/lightheadedness/chest pain/SOB with ambulation.  Objective: Vital signs in last 24 hours: Temp:  [98.3 F (36.8 C)-99.5 F (37.5 C)] 98.9 F (37.2 C) (08/16 0522) Pulse Rate:  [95-107] 95 (08/16 0522) Resp:  [20] 20 (08/16 0522) BP: (116-134)/(54-62) 129/62 mmHg (08/16 0522) SpO2:  [96 %-100 %] 100 % (08/16 0522)  Intake/Output from previous day: 08/15 0701 - 08/16 0700 In: 1330 [P.O.:1320; I.V.:10] Out: 25 [Urine:2770; Drains:950] Intake/Output this shift: Total I/O In: -  Out: 40 [Urine:1000; Drains:20]  Physical Exam:  General: Alert and oriented CV: RRR Lungs: Clear Abdomen: Soft, ND Incisions: CDI. JP: serosang. Foley: clear yellow Ext: NT, No erythema  Lab Results:  Recent Labs  12/31/13 0456 12/31/13 1232 01/01/14 0513  HGB 9.1* 8.8* 7.9*  HCT 27.5* 26.7* 24.7*   BMET  Recent Labs  12/31/13 1232 01/01/14 0513  NA 133* 133*  K 5.5* 4.8  CL 100 100  CO2 24 24  GLUCOSE 114* 117*  BUN 29* 33*  CREATININE 2.26* 1.88*  CALCIUM 8.4 8.3*     Studies/Results: No results found.  Assessment/Plan: 53yM POD#2 from RALP w bPLND for PCa.   Acute blood loss anemia - Hgb 7.9 from 13. Complicated by thrombocytopenia. No overt symptoms, but HR increasing. Will take JP off suction. Recheck hgb at noon. If develops symptoms or hgb <7, will transfuse 2U PRBCs and platelets.  Acute kidney injury - Peak cre 2.2 (baseline 1.2). Now resolving. Likely due to toradol plus pre-operative dehydration. Expect full recovery.  Prophylaxis - ambulating, SCDs. Holding pharmacologic anticoagulation due to bleeding.  Discussed with Dr. Tresa Moore.   LOS: 2 days   Margo Aye 01/01/2014, 8:47 AM   I have performed a  history + physical examination on the patient and agree with Dr. Johnnye Sima assesment and plan. Additional findings as per below:   S:  1 - High Risk Prostate Cancer - s/p robotic radical prostatectomy with bilateral pelvic lymphadenectomy 12/30/13. Path pending.   2- Acute Renal Failure - Baseline Cr <1.5. Noted to have Ct to 1.88 8/15 AM labs. JP Cr same as serum. Cr peak 8/15, now trending down holding ARB and NSAIDS.   3 - Acute Blood Loss Anemia - Baseling Hgb >13. Noted to have Hgb 9's 8/15. No orthostaisis. Recheck this AM lower again (7.9)  O:  Cr 1.8, Hgb 7.9 --> recheck 8.8 (stable)  JP output trending down. SNTND  Port sites c/d/i.  JP with much lighter serosanguinous efflux  Stable Rt>Lt Le edema that is chronic and unchanged from pre-op   A/P:  1 - High Risk Prostate Cancer - remain in house. Ambulating well and pain controlled.   2- Acute Renal Failure - Likely pre-renal / intrisic renal. Cr now improving with holding ARB / NSAIDS.   3 - Acute Blood Loss Anemia - Recheck Hgb today actually stable from yesterday.  4 - Likely DC tomorrow AM after recked Cr and Hgb.

## 2014-01-01 NOTE — Progress Notes (Signed)
JP drain site is leaking & dressing was soaked wet. Reinforced dressing. Will cont to monitor.

## 2014-01-01 NOTE — Progress Notes (Signed)
Dressing to JP site  Very saturated  With noted  Mod amount of sanguinous drainage leaking through uncovered area.JP intact with noted 20 ml,19ml,20 ml output q 1 hr interval. Patient vital signs stable,however very much concern about his total blood loss. Attempted to call on call Dr Louis Meckel but no response ter received at this time.

## 2014-01-02 ENCOUNTER — Encounter (HOSPITAL_COMMUNITY): Payer: Self-pay | Admitting: Urology

## 2014-01-02 LAB — CBC
HCT: 24 % — ABNORMAL LOW (ref 39.0–52.0)
Hemoglobin: 7.9 g/dL — ABNORMAL LOW (ref 13.0–17.0)
MCH: 28.5 pg (ref 26.0–34.0)
MCHC: 32.9 g/dL (ref 30.0–36.0)
MCV: 86.6 fL (ref 78.0–100.0)
PLATELETS: 101 10*3/uL — AB (ref 150–400)
RBC: 2.77 MIL/uL — AB (ref 4.22–5.81)
RDW: 15.5 % (ref 11.5–15.5)
WBC: 9.8 10*3/uL (ref 4.0–10.5)

## 2014-01-02 LAB — BASIC METABOLIC PANEL
ANION GAP: 9 (ref 5–15)
BUN: 29 mg/dL — ABNORMAL HIGH (ref 6–23)
CALCIUM: 8.8 mg/dL (ref 8.4–10.5)
CO2: 24 meq/L (ref 19–32)
Chloride: 102 mEq/L (ref 96–112)
Creatinine, Ser: 1.62 mg/dL — ABNORMAL HIGH (ref 0.50–1.35)
GFR calc Af Amer: 48 mL/min — ABNORMAL LOW (ref >90)
GFR calc non Af Amer: 42 mL/min — ABNORMAL LOW (ref >90)
Glucose, Bld: 142 mg/dL — ABNORMAL HIGH (ref 70–99)
Potassium: 4.6 mEq/L (ref 3.7–5.3)
SODIUM: 135 meq/L — AB (ref 137–147)

## 2014-01-02 MED ORDER — CEPHALEXIN 500 MG PO CAPS: 500.0000 mg | ORAL_CAPSULE | Freq: Two times a day (BID) | ORAL | Status: DC

## 2014-01-02 MED ORDER — OXYCODONE-ACETAMINOPHEN 5-325 MG PO TABS: 1.0000 | ORAL_TABLET | Freq: Four times a day (QID) | ORAL | Status: DC | PRN

## 2014-01-02 MED ORDER — SENNOSIDES-DOCUSATE SODIUM 8.6-50 MG PO TABS: 1.0000 | ORAL_TABLET | Freq: Two times a day (BID) | ORAL | Status: DC

## 2014-01-02 NOTE — Progress Notes (Signed)
3 Days Post-Op  Subjective:  1 - High Risk Prostate Cancer - s/p robotic radical prostatectomy with bilateral pelvic lymphadenectomy 12/30/13. Path pending.   2- Acute Renal Failure - Baseline Cr <1.5. Noted to have Ct to 1.88 8/15 AM labs. JP Cr same as serum. Cr peak 8/15, now trending down holding ARB and NSAIDS.   3 - Acute Blood Loss Anemia - Baseling Hgb >13. Noted to have Hgb 9's 8/15. No orthostaisis. Recheck this AM lower again (7.9)   Objective: Vital signs in last 24 hours: Temp:  [98.1 F (36.7 C)-99.3 F (37.4 C)] 99 F (37.2 C) (08/17 0545) Pulse Rate:  [95-108] 95 (08/17 0545) Resp:  [18] 18 (08/17 0614) BP: (114-117)/(55-56) 116/56 mmHg (08/17 0545) SpO2:  [97 %-100 %] 97 % (08/17 0614) Last BM Date: 12/29/13  Intake/Output from previous day: 08/16 0701 - 08/17 0700 In: 840 [P.O.:840] Out: 4295 [Urine:3925; Drains:370] Intake/Output this shift:    See DC summary  Lab Results:   Recent Labs  01/01/14 0513 01/01/14 1246 01/02/14 0416  WBC 8.4  --  9.8  HGB 7.9* 8.8* 7.9*  HCT 24.7* 27.1* 24.0*  PLT 88*  --  101*   BMET  Recent Labs  01/01/14 0513 01/02/14 0416  NA 133* 135*  K 4.8 4.6  CL 100 102  CO2 24 24  GLUCOSE 117* 142*  BUN 33* 29*  CREATININE 1.88* 1.62*  CALCIUM 8.3* 8.8   PT/INR No results found for this basename: LABPROT, INR,  in the last 72 hours ABG No results found for this basename: PHART, PCO2, PO2, HCO3,  in the last 72 hours  Studies/Results: No results found.  Anti-infectives: Anti-infectives   Start     Dose/Rate Route Frequency Ordered Stop   12/30/13 1500  ceFAZolin (ANCEF) IVPB 2 g/50 mL premix     2 g 100 mL/hr over 30 Minutes Intravenous Every 8 hours 12/30/13 1329 12/30/13 2230   12/30/13 0615  ceFAZolin (ANCEF) IVPB 2 g/50 mL premix     2 g 100 mL/hr over 30 Minutes Intravenous 30 min pre-op 12/30/13 0615 12/30/13 0750      Assessment/Plan:  This note was changed to DC summary.  [facility],  Sheritha Louis 01/02/2014

## 2014-01-02 NOTE — Discharge Summary (Signed)
Physician Discharge Summary  Patient ID: [name] MRN: [id number] DOB/AGE: 06-19-1945 68 y.o.  Admit date: 12/30/2013 Discharge date: 01/02/2014  Admission Diagnoses:High Risk Prostate Cancer  Discharge Diagnoses:  Active Problems:   Prostate cancer   Discharged Condition: good  Hospital Course:    1 - High Risk Prostate Cancer - s/p robotic radical prostatectomy with bilateral pelvic lymphadenectomy 12/30/13. Path pending. JP removed 8/17 as output trending down, and proven not urine by Cr analysis.   2- Acute Renal Failure - Baseline Cr <1.5. Noted to have Ct to 1.88 8/15 AM labs. JP Cr same as serum. Cr peak 8/15 at 2.2, now trending down holding ARB and NSAIDS. Hold ARB at discharge.   3 - Acute Blood Loss Anemia - Baseling Hgb >13. Noted to have Hgb 9's 8/15. No orthostaisis. Recheck x several Hgb stable 8-9 range. Hold anticoagulation at discharge.   By 8/17, the day of discharge, pt ambulatory, pain controlled, tollerating regular diet, and felt to be adequate for discharge.   Consults: None  Significant Diagnostic Studies: labs: Hgb 7.9 (stable), Cr 1.62 (improving), pathology - pending  Treatments: surgery:  robotic radical prostatectomy with bilateral pelvic lymphadenectomy 12/30/13.  Discharge Exam: Blood pressure 116/56, pulse 95, temperature 99 F (37.2 C), temperature source Oral, resp. rate 18, height 6\' 2"  (1.88 m), weight 117.482 kg (259 lb), SpO2 97.00%. General appearance: alert, cooperative, appears stated age and wife at bedside Eyes: PERRL Ears: Nl external exam Nose: Nares normal. Septum midline. Mucosa normal. No drainage or sinus tenderness. Throat: lips, mucosa, and tongue normal; teeth and gums normal Neck: supple, symmetrical, trachea midline Back: symmetric, no curvature. ROM normal. No CVA tenderness. Resp: non-labored on room air Chest wall: no tenderness Cardio: Nl rate GI: soft, non-tender; bowel sounds normal; no masses,  no  organomegaly Male genitalia: normal, foley c/d/i with clear yellow urine Extremities: extremities normal, atraumatic, no cyanosis or edema Pulses: 2+ and symmetric Skin: Skin color, texture, turgor normal. No rashes or lesions Lymph nodes: Cervical, supraclavicular, and axillary nodes normal. Neurologic: Grossly normal Wound: Recnet port sites / extraction sites c/d/i. JP with serous fluid, removed and dry dressing applied.   Disposition: Final discharge disposition not confirmed     Medication List    ASK your doctor about these medications       losartan 100 MG tablet  Commonly known as:  COZAAR  Take 100 mg by mouth every morning.     warfarin 7.5 MG tablet  Commonly known as:  COUMADIN  Take 7.5 mg by mouth every morning.         SignedAlexis Frock 01/02/2014, 7:39 AM

## 2014-01-10 ENCOUNTER — Encounter (HOSPITAL_COMMUNITY): Payer: Self-pay | Admitting: Emergency Medicine

## 2014-01-10 ENCOUNTER — Inpatient Hospital Stay (HOSPITAL_COMMUNITY)
Admission: EM | Admit: 2014-01-10 | Discharge: 2014-01-14 | DRG: 690 | Disposition: A | Discharge status: Home / Self Care | Payer: Medicare Other | Attending: Cardiology | Admitting: Cardiology

## 2014-01-10 ENCOUNTER — Emergency Department (HOSPITAL_COMMUNITY): Payer: Medicare Other

## 2014-01-10 DIAGNOSIS — N12 Tubulo-interstitial nephritis, not specified as acute or chronic: Secondary | ICD-10-CM | POA: Diagnosis present

## 2014-01-10 DIAGNOSIS — D62 Acute posthemorrhagic anemia: Secondary | ICD-10-CM | POA: Diagnosis present

## 2014-01-10 DIAGNOSIS — I129 Hypertensive chronic kidney disease with stage 1 through stage 4 chronic kidney disease, or unspecified chronic kidney disease: Secondary | ICD-10-CM | POA: Diagnosis present

## 2014-01-10 DIAGNOSIS — E440 Moderate protein-calorie malnutrition: Secondary | ICD-10-CM | POA: Insufficient documentation

## 2014-01-10 DIAGNOSIS — I82509 Chronic embolism and thrombosis of unspecified deep veins of unspecified lower extremity: Secondary | ICD-10-CM | POA: Diagnosis present

## 2014-01-10 DIAGNOSIS — Z9079 Acquired absence of other genital organ(s): Secondary | ICD-10-CM

## 2014-01-10 DIAGNOSIS — E8809 Other disorders of plasma-protein metabolism, not elsewhere classified: Secondary | ICD-10-CM | POA: Diagnosis present

## 2014-01-10 DIAGNOSIS — N179 Acute kidney failure, unspecified: Secondary | ICD-10-CM | POA: Diagnosis present

## 2014-01-10 DIAGNOSIS — Z8546 Personal history of malignant neoplasm of prostate: Secondary | ICD-10-CM | POA: Diagnosis not present

## 2014-01-10 DIAGNOSIS — R112 Nausea with vomiting, unspecified: Secondary | ICD-10-CM | POA: Diagnosis present

## 2014-01-10 DIAGNOSIS — N183 Chronic kidney disease, stage 3 unspecified: Secondary | ICD-10-CM | POA: Diagnosis present

## 2014-01-10 DIAGNOSIS — N10 Acute tubulo-interstitial nephritis: Secondary | ICD-10-CM | POA: Diagnosis present

## 2014-01-10 DIAGNOSIS — I959 Hypotension, unspecified: Secondary | ICD-10-CM | POA: Diagnosis present

## 2014-01-10 DIAGNOSIS — E739 Lactose intolerance, unspecified: Secondary | ICD-10-CM | POA: Diagnosis present

## 2014-01-10 DIAGNOSIS — Z79899 Other long term (current) drug therapy: Secondary | ICD-10-CM

## 2014-01-10 DIAGNOSIS — N393 Stress incontinence (female) (male): Secondary | ICD-10-CM | POA: Diagnosis present

## 2014-01-10 DIAGNOSIS — R5381 Other malaise: Secondary | ICD-10-CM | POA: Diagnosis present

## 2014-01-10 DIAGNOSIS — B9689 Other specified bacterial agents as the cause of diseases classified elsewhere: Secondary | ICD-10-CM | POA: Diagnosis present

## 2014-01-10 DIAGNOSIS — R5383 Other fatigue: Secondary | ICD-10-CM | POA: Diagnosis present

## 2014-01-10 DIAGNOSIS — Z7901 Long term (current) use of anticoagulants: Secondary | ICD-10-CM

## 2014-01-10 DIAGNOSIS — N1330 Unspecified hydronephrosis: Secondary | ICD-10-CM | POA: Diagnosis present

## 2014-01-10 DIAGNOSIS — C61 Malignant neoplasm of prostate: Secondary | ICD-10-CM | POA: Diagnosis present

## 2014-01-10 LAB — COMPREHENSIVE METABOLIC PANEL
ALBUMIN: 2.9 g/dL — AB (ref 3.5–5.2)
ALT: 59 U/L — ABNORMAL HIGH (ref 0–53)
AST: 57 U/L — AB (ref 0–37)
Alkaline Phosphatase: 83 U/L (ref 39–117)
Anion gap: 18 — ABNORMAL HIGH (ref 5–15)
BUN: 57 mg/dL — ABNORMAL HIGH (ref 6–23)
CALCIUM: 9.3 mg/dL (ref 8.4–10.5)
CO2: 20 meq/L (ref 19–32)
CREATININE: 2.42 mg/dL — AB (ref 0.50–1.35)
Chloride: 96 mEq/L (ref 96–112)
GFR calc Af Amer: 30 mL/min — ABNORMAL LOW (ref >90)
GFR, EST NON AFRICAN AMERICAN: 26 mL/min — AB (ref >90)
Glucose, Bld: 131 mg/dL — ABNORMAL HIGH (ref 70–99)
Potassium: 4.3 mEq/L (ref 3.7–5.3)
Sodium: 134 mEq/L — ABNORMAL LOW (ref 137–147)
Total Bilirubin: 0.6 mg/dL (ref 0.3–1.2)
Total Protein: 7.1 g/dL (ref 6.0–8.3)

## 2014-01-10 LAB — CBC WITH DIFFERENTIAL/PLATELET
BASOS ABS: 0 10*3/uL (ref 0.0–0.1)
BASOS PCT: 0 % (ref 0–1)
EOS PCT: 1 % (ref 0–5)
Eosinophils Absolute: 0.1 10*3/uL (ref 0.0–0.7)
HEMATOCRIT: 23.6 % — AB (ref 39.0–52.0)
Hemoglobin: 8 g/dL — ABNORMAL LOW (ref 13.0–17.0)
Lymphocytes Relative: 11 % — ABNORMAL LOW (ref 12–46)
Lymphs Abs: 0.8 10*3/uL (ref 0.7–4.0)
MCH: 28.7 pg (ref 26.0–34.0)
MCHC: 33.9 g/dL (ref 30.0–36.0)
MCV: 84.6 fL (ref 78.0–100.0)
MONO ABS: 0.5 10*3/uL (ref 0.1–1.0)
Monocytes Relative: 7 % (ref 3–12)
Neutro Abs: 6.4 10*3/uL (ref 1.7–7.7)
Neutrophils Relative %: 81 % — ABNORMAL HIGH (ref 43–77)
Platelets: 252 10*3/uL (ref 150–400)
RBC: 2.79 MIL/uL — ABNORMAL LOW (ref 4.22–5.81)
RDW: 14.6 % (ref 11.5–15.5)
WBC: 7.9 10*3/uL (ref 4.0–10.5)

## 2014-01-10 LAB — URINALYSIS, ROUTINE W REFLEX MICROSCOPIC
Bilirubin Urine: NEGATIVE
GLUCOSE, UA: NEGATIVE mg/dL
Ketones, ur: NEGATIVE mg/dL
Nitrite: POSITIVE — AB
Protein, ur: 100 mg/dL — AB
Specific Gravity, Urine: 1.019 (ref 1.005–1.030)
Urobilinogen, UA: 0.2 mg/dL (ref 0.0–1.0)
pH: 5.5 (ref 5.0–8.0)

## 2014-01-10 LAB — URINE MICROSCOPIC-ADD ON

## 2014-01-10 LAB — LIPASE, BLOOD: Lipase: 32 U/L (ref 11–59)

## 2014-01-10 LAB — APTT: aPTT: 23 seconds — ABNORMAL LOW (ref 24–37)

## 2014-01-10 LAB — I-STAT CG4 LACTIC ACID, ED: Lactic Acid, Venous: 1.16 mmol/L (ref 0.5–2.2)

## 2014-01-10 LAB — PROTIME-INR
INR: 1.18 (ref 0.00–1.49)
Prothrombin Time: 15 seconds (ref 11.6–15.2)

## 2014-01-10 LAB — TROPONIN I

## 2014-01-10 MED ORDER — ONDANSETRON HCL 4 MG/2ML IJ SOLN
4.0000 mg | Freq: Once | INTRAMUSCULAR | Status: AC
  Administered 2014-01-10: 4 mg via INTRAVENOUS
  Filled 2014-01-10: qty 2

## 2014-01-10 MED ORDER — WARFARIN - PHARMACIST DOSING INPATIENT: Freq: Every day | Status: DC

## 2014-01-10 MED ORDER — PANTOPRAZOLE SODIUM 40 MG PO TBEC
40.0000 mg | DELAYED_RELEASE_TABLET | Freq: Every day | ORAL | Status: DC
  Administered 2014-01-11 – 2014-01-14 (×4): 40 mg via ORAL
  Filled 2014-01-10 (×4): qty 1

## 2014-01-10 MED ORDER — HEPARIN BOLUS VIA INFUSION
2500.0000 [IU] | Freq: Once | INTRAVENOUS | Status: AC
  Administered 2014-01-11: 2500 [IU] via INTRAVENOUS
  Filled 2014-01-10: qty 2500

## 2014-01-10 MED ORDER — SODIUM CHLORIDE 0.9 % IV SOLN
INTRAVENOUS | Status: AC
  Administered 2014-01-10: 16:00:00 via INTRAVENOUS

## 2014-01-10 MED ORDER — DEXTROSE 5 % IV SOLN
1.0000 g | Freq: Once | INTRAVENOUS | Status: AC
  Administered 2014-01-10: 1 g via INTRAVENOUS
  Filled 2014-01-10: qty 10

## 2014-01-10 MED ORDER — ONDANSETRON HCL 4 MG/2ML IJ SOLN: 4.0000 mg | Freq: Three times a day (TID) | INTRAMUSCULAR | Status: AC | PRN

## 2014-01-10 MED ORDER — HEPARIN (PORCINE) IN NACL 100-0.45 UNIT/ML-% IJ SOLN
1650.0000 [IU]/h | INTRAMUSCULAR | Status: DC
  Administered 2014-01-11 (×2): 1650 [IU]/h via INTRAVENOUS
  Filled 2014-01-10 (×2): qty 250

## 2014-01-10 MED ORDER — FERROUS SULFATE 325 (65 FE) MG PO TABS
325.0000 mg | ORAL_TABLET | Freq: Three times a day (TID) | ORAL | Status: DC
  Administered 2014-01-11 – 2014-01-14 (×12): 325 mg via ORAL
  Filled 2014-01-10 (×14): qty 1

## 2014-01-10 MED ORDER — IOHEXOL 300 MG/ML  SOLN
50.0000 mL | Freq: Once | INTRAMUSCULAR | Status: AC | PRN
  Administered 2014-01-10: 50 mL via ORAL

## 2014-01-10 MED ORDER — SENNOSIDES-DOCUSATE SODIUM 8.6-50 MG PO TABS
1.0000 | ORAL_TABLET | Freq: Two times a day (BID) | ORAL | Status: DC
  Administered 2014-01-11 – 2014-01-14 (×5): 1 via ORAL
  Filled 2014-01-10 (×9): qty 1

## 2014-01-10 MED ORDER — DEXTROSE 5 % IV SOLN
1.0000 g | INTRAVENOUS | Status: DC
  Administered 2014-01-11 – 2014-01-13 (×3): 1 g via INTRAVENOUS
  Filled 2014-01-10 (×4): qty 10

## 2014-01-10 MED ORDER — SODIUM CHLORIDE 0.9 % IV SOLN
INTRAVENOUS | Status: DC
  Administered 2014-01-11 – 2014-01-13 (×3): via INTRAVENOUS
  Administered 2014-01-13: 1000 mL via INTRAVENOUS

## 2014-01-10 MED ORDER — SODIUM CHLORIDE 0.9 % IV BOLUS (SEPSIS)
500.0000 mL | Freq: Once | INTRAVENOUS | Status: AC
  Administered 2014-01-10: 500 mL via INTRAVENOUS

## 2014-01-10 MED ORDER — WARFARIN SODIUM 7.5 MG PO TABS
7.5000 mg | ORAL_TABLET | Freq: Once | ORAL | Status: AC
  Administered 2014-01-10: 7.5 mg via ORAL
  Filled 2014-01-10: qty 1

## 2014-01-10 NOTE — Progress Notes (Signed)
ANTICOAGULATION CONSULT NOTE - Initial Consult  Pharmacy Consult for Heparin/Warfarin Indication: VTE prophylaxis  No Known Allergies  Patient Measurements: Height: 6\' 2"  (188 cm) Weight: 244 lb 0.8 oz (110.7 kg) IBW/kg (Calculated) : 82.2 Heparin Dosing Weight: 105 kg  Vital Signs: BP: 131/70 mmHg (08/25 1400) Pulse Rate: 93 (08/25 1400)  Labs:  Recent Labs  01/10/14 0916  HGB 8.0*  HCT 23.6*  PLT 252  CREATININE 2.42*  TROPONINI <0.30    Estimated Creatinine Clearance: 38.1 ml/min (by C-G formula based on Cr of 2.42).   Medical History: Past Medical History  Diagnosis Date  . Hypertension   . Leg cramps   . Varicose veins     both legs  . DVT (deep venous thrombosis)     rt leg 3 yrs ago  . Swelling of lower extremity     RT LEG  . Prostate cancer   . Difficulty sleeping     Medications:  Prescriptions prior to admission  Medication Sig Dispense Refill  . cephALEXin (KEFLEX) 500 MG capsule Take 1 capsule (500 mg total) by mouth 2 (two) times daily. X 3 days. Start day before next urology appointment.  6 capsule  0  . losartan (COZAAR) 100 MG tablet Take 100 mg by mouth daily.      Marland Kitchen oxyCODONE-acetaminophen (ROXICET) 5-325 MG per tablet Take 1-2 tablets by mouth every 6 (six) hours as needed for moderate pain or severe pain. Post-operatively  30 tablet  0  . senna-docusate (SENOKOT-S) 8.6-50 MG per tablet Take 1 tablet by mouth 2 (two) times daily. While taking pain meds to prevent constipation  30 tablet  0  . warfarin (COUMADIN) 7.5 MG tablet Take 7.5 mg by mouth daily.       Scheduled:  . sodium chloride   Intravenous STAT  . [START ON 01/11/2014] cefTRIAXone (ROCEPHIN)  IV  1 g Intravenous Q24H  . ferrous sulfate  325 mg Oral TID WC  . heparin  2,500 Units Intravenous Once  . [START ON 01/11/2014] pantoprazole  40 mg Oral Daily  . senna-docusate  1 tablet Oral BID  . warfarin  7.5 mg Oral Once  . [START ON 01/11/2014] Warfarin - Pharmacist Dosing  Inpatient   Does not apply q1800   Infusions:  . sodium chloride 75 mL/hr at 01/10/14 1845  . heparin     Anti-infectives   Start     Dose/Rate Route Frequency Ordered Stop   01/11/14 1600  cefTRIAXone (ROCEPHIN) 1 g in dextrose 5 % 50 mL IVPB     1 g 100 mL/hr over 30 Minutes Intravenous Every 24 hours 01/10/14 1525     01/10/14 1500  cefTRIAXone (ROCEPHIN) 1 g in dextrose 5 % 50 mL IVPB     1 g 100 mL/hr over 30 Minutes Intravenous  Once 01/10/14 1457 01/10/14 1530      Assessment: 67yoM with T3 prostate cancer, s/p radical prostatectomy w/ bilateral pelvic lymphadenectomy 8/14, admitted today for nausea/emesis and AKI.  On warfarin at home d/t Hx DVT; held prior to procedure but never resumed d/t post-op anemia/blood loss.  Given clearance to resume anticoagulation per urology; requested bridge to warfarin with heparin per cardiology.   No recent INR/aPTT  Plt wnl  Hgb still low, but appears to be stable  Goal of Therapy:  INR 2-3 Heparin level 0.3-0.7 units/ml Monitor platelets by anticoagulation protocol: Yes   Plan:   Will resume home warfarin dosing  Warfarin 7.5 mg po x 1  tonight  Give 2500 units bolus x 1  Start heparin infusion at 1650 units/hr  Check anti-Xa level in 6 hours and daily while on heparin  Daily CBC while on heparin; follow hemoglobin closely  Daily INR  Reuel Boom, PharmD [phone] 01/10/2014 10:11 PM

## 2014-01-10 NOTE — ED Notes (Signed)
Pt alert, arrives from home, c/o nausea, emesis, onset was two days ago, recent prostate sx, presents with in dwelling foley catheter, resp even unlabored, skin pwd

## 2014-01-10 NOTE — H&P (Signed)
[name] is an 68 y.o. male.   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 Patient had CT of the abdomen done in the ED which showed mild left hydronephrosis with questionable evidence of acute pyelonephritis and also was noted to have elevated creatinine to 2.42. Patient had indwelling Foley catheter since his surgery which was removed earlier today. Urine cultures and blood cultures have been drawn and patient was started on IV Rocephin. Patient denies any dysuria denies any fever but complains of chills. Denies any bright red blood per rectum.  Past Medical History  Diagnosis Date  . Hypertension   . Leg cramps   . Varicose veins     both legs  . DVT (deep venous thrombosis)     rt leg 3 yrs ago  . Swelling of lower extremity     RT LEG  . Prostate cancer   . Difficulty sleeping     Past Surgical History  Procedure Laterality Date  . Nasal sinus surgery    . Vein surgery      RT LEG  . Hernia repair  2012    INGUINAL  . Robot assisted laparoscopic radical prostatectomy N/A 12/30/2013    Procedure: ROBOTIC ASSISTED LAPAROSCOPIC RADICAL PROSTATECTOMY WITH INDOCYANINE GREEN DYE;  Surgeon: Alexis Frock, MD;  Location: [facility];  Service: Urology;  Laterality: N/A;  .  Lymphadenectomy Bilateral 12/30/2013    Procedure: PELVIC LYMPH NODE DISSECTION;  Surgeon: Alexis Frock, MD;  Location: [facility];  Service: Urology;  Laterality: Bilateral;    No family history on file. Social History:  reports that he has never smoked. He does not have any smokeless tobacco history on file. He reports that he does not drink alcohol or use illicit drugs.  Allergies: No Known Allergies  Medications Prior to Admission  Medication Sig Dispense Refill  . cephALEXin (KEFLEX) 500 MG capsule Take 1 capsule (500 mg total) by mouth 2 (two) times daily. X 3 days. Start day before next urology appointment.  6 capsule  0  . losartan (COZAAR) 100 MG tablet Take 100 mg by mouth daily.      Marland Kitchen oxyCODONE-acetaminophen (ROXICET) 5-325 MG per tablet Take 1-2 tablets by mouth every 6 (six) hours as needed for moderate pain or severe pain. Post-operatively  30 tablet  0  . senna-docusate (SENOKOT-S) 8.6-50 MG per tablet Take 1 tablet by mouth 2 (two) times daily. While taking pain meds to prevent constipation  30 tablet  0  . warfarin (COUMADIN) 7.5 MG tablet Take 7.5 mg by mouth daily.        Results for orders placed during the hospital encounter of 01/10/14 (from the past 48 hour(s))  CBC WITH DIFFERENTIAL     Status: Abnormal   Collection Time    01/10/14  9:16 AM      Result Value Ref Range   WBC 7.9  4.0 - 10.5 K/uL  RBC 2.79 (*) 4.22 - 5.81 MIL/uL   Hemoglobin 8.0 (*) 13.0 - 17.0 g/dL   HCT 23.6 (*) 39.0 - 52.0 %   MCV 84.6  78.0 - 100.0 fL   MCH 28.7  26.0 - 34.0 pg   MCHC 33.9  30.0 - 36.0 g/dL   RDW 14.6  11.5 - 15.5 %   Platelets 252  150 - 400 K/uL   Neutrophils Relative % 81 (*) 43 - 77 %   Neutro Abs 6.4  1.7 - 7.7 K/uL   Lymphocytes Relative 11 (*) 12 - 46 %   Lymphs Abs 0.8  0.7 - 4.0 K/uL   Monocytes Relative 7  3 - 12 %   Monocytes Absolute 0.5  0.1 - 1.0 K/uL   Eosinophils Relative 1  0 - 5 %   Eosinophils Absolute 0.1  0.0 - 0.7 K/uL   Basophils Relative 0  0 -  1 %   Basophils Absolute 0.0  0.0 - 0.1 K/uL  COMPREHENSIVE METABOLIC PANEL     Status: Abnormal   Collection Time    01/10/14  9:16 AM      Result Value Ref Range   Sodium 134 (*) 137 - 147 mEq/L   Potassium 4.3  3.7 - 5.3 mEq/L   Chloride 96  96 - 112 mEq/L   CO2 20  19 - 32 mEq/L   Glucose, Bld 131 (*) 70 - 99 mg/dL   BUN 57 (*) 6 - 23 mg/dL   Creatinine, Ser 2.42 (*) 0.50 - 1.35 mg/dL   Calcium 9.3  8.4 - 10.5 mg/dL   Total Protein 7.1  6.0 - 8.3 g/dL   Albumin 2.9 (*) 3.5 - 5.2 g/dL   AST 57 (*) 0 - 37 U/L   ALT 59 (*) 0 - 53 U/L   Alkaline Phosphatase 83  39 - 117 U/L   Total Bilirubin 0.6  0.3 - 1.2 mg/dL   GFR calc non Af Amer 26 (*) >90 mL/min   GFR calc Af Amer 30 (*) >90 mL/min   Comment: (NOTE)     The eGFR has been calculated using the CKD EPI equation.     This calculation has not been validated in all clinical situations.     eGFR's persistently <90 mL/min signify possible Chronic Kidney     Disease.   Anion gap 18 (*) 5 - 15  TROPONIN I     Status: None   Collection Time    01/10/14  9:16 AM      Result Value Ref Range   Troponin I <0.30  <0.30 ng/mL   Comment:            Due to the release kinetics of cTnI,     a negative result within the first hours     of the onset of symptoms does not rule out     myocardial infarction with certainty.     If myocardial infarction is still suspected,     repeat the test at appropriate intervals.  LIPASE, BLOOD     Status: None   Collection Time    01/10/14  9:16 AM      Result Value Ref Range   Lipase 32  11 - 59 U/L  I-STAT CG4 LACTIC ACID, ED     Status: None   Collection Time    01/10/14  9:29 AM      Result Value Ref Range   Lactic Acid, Venous 1.16  0.5 -  2.2 mmol/L  URINALYSIS, ROUTINE W REFLEX MICROSCOPIC     Status: Abnormal   Collection Time    01/10/14 10:09 AM      Result Value Ref Range   Color, Urine AMBER (*) YELLOW   Comment: BIOCHEMICALS MAY BE AFFECTED BY COLOR   APPearance CLOUDY (*) CLEAR    Specific Gravity, Urine 1.019  1.005 - 1.030   pH 5.5  5.0 - 8.0   Glucose, UA NEGATIVE  NEGATIVE mg/dL   Hgb urine dipstick LARGE (*) NEGATIVE   Bilirubin Urine NEGATIVE  NEGATIVE   Ketones, ur NEGATIVE  NEGATIVE mg/dL   Protein, ur 100 (*) NEGATIVE mg/dL   Urobilinogen, UA 0.2  0.0 - 1.0 mg/dL   Nitrite POSITIVE (*) NEGATIVE   Leukocytes, UA MODERATE (*) NEGATIVE  URINE MICROSCOPIC-ADD ON     Status: Abnormal   Collection Time    01/10/14 10:09 AM      Result Value Ref Range   Squamous Epithelial / LPF FEW (*) RARE   WBC, UA 21-50  <3 WBC/hpf   RBC / HPF 21-50  <3 RBC/hpf   Bacteria, UA MANY (*) RARE   Urine-Other MUCOUS PRESENT     Ct Abdomen Pelvis Wo Contrast  01/10/2014   CLINICAL DATA:  Abdominal pain and vomiting; recent hernia repair and prostatectomy  EXAM: CT ABDOMEN AND PELVIS WITHOUT CONTRAST  TECHNIQUE: Multidetector CT imaging of the abdomen and pelvis was performed following the standard protocol without IV contrast. Oral contrast was administered.  COMPARISON:  None.  FINDINGS: There is mild bibasilar atelectatic change.  No focal liver lesions are identified on this noncontrast enhanced study. Gallbladder wall is not thickened. There may be sludge in the gallbladder. There is no biliary duct dilatation.  Spleen, pancreas, and adrenals appear normal.  There is a 6 x 4 mm calculus in the periphery of the mid right kidney. There is a tiny nonobstructing calculus in the upper pole the right kidney. There is a 1 mm calculus in the mid to lower pole right kidney. There is no right renal mass or hydronephrosis. There is no ureteral calculus on the right.  On the left, there is a 1 mm nonobstructing calculus in the medial upper to midpole region. There is slight hydronephrosis on the left. There is no left renal mass. There is no ureteral calculus on the left.  In the pelvis, the urinary bladder is decompressed with a Foley catheter. Prostate is absent. There is fat in the left  inguinal ring. There is scarring in the right inguinal ring.  There is soft tissue stranding in the right abdominal wall, most likely due to postoperative change. There is no abdominal wall abscess or well-defined hematoma. There may be some liquefying hematoma in the abdominal wall on the right.  There are a few tiny foci of free intraperitoneal air which may be of postoperative etiology. There is no portal venous air. There is no bowel obstruction.  There is no appreciable ascites. There is no abscess or adenopathy in the abdomen or pelvis. Appendix appears normal.  There is no appreciable abdominal aortic aneurysm. There is slight anterolisthesis of L4 on L5, felt to be due to underlying spondylosis. No blastic or lytic bone lesions are identified.  IMPRESSION: Areas of postoperative change. Minimal free intraperitoneal air may be residua of the recent prostatectomy. If patient has clinical symptoms persist, repeat study to assess for resolution of this minimal free intraperitoneal air would certainly be warranted. This patient should  be watched closely clinically given this finding and clinical symptoms.  Mild hydronephrosis on the left without obstructing lesion seen. This finding could indicate recent calculus passage. This finding also could be indicative of a degree of pyelonephritis on the left.  No bowel obstruction.  No abscess.  Appendix region appears normal.  Possible sludge in gallbladder. Gallbladder wall not thickened. No pericholecystic fluid or obvious gallstones by CT.   Electronically Signed   By: Lowella Grip M.D.   On: 01/10/2014 13:12    Review of Systems  Constitutional: Positive for chills and malaise/fatigue.  Eyes: Negative for blurred vision and double vision.  Respiratory: Negative for sputum production, shortness of breath and wheezing.   Cardiovascular: Positive for leg swelling. Negative for chest pain, palpitations and orthopnea.  Gastrointestinal: Positive for nausea,  vomiting and diarrhea. Negative for blood in stool and melena.  Genitourinary: Negative for dysuria and flank pain.  Neurological: Negative for dizziness and headaches.    Blood pressure 131/70, pulse 93, temperature 98.4 F (36.9 C), temperature source Oral, resp. rate 21, height 6' 2"  (1.88 m), weight 110.7 kg (244 lb 0.8 oz), SpO2 100.00%. Physical Exam  Constitutional: He is oriented to person, place, and time. He appears well-developed and well-nourished.  HENT:  Head: Normocephalic and atraumatic.  Eyes: Conjunctivae are normal. Pupils are equal, round, and reactive to light. Left eye exhibits no discharge. No scleral icterus.  Neck: Normal range of motion. Neck supple. No JVD present. No thyromegaly present.  Cardiovascular: Normal rate and regular rhythm.  Exam reveals no friction rub.   No murmur heard. Respiratory: Effort normal and breath sounds normal. No respiratory distress. He has no wheezes. He has no rales.  GI: Soft. Bowel sounds are normal. He exhibits no distension. There is no tenderness. There is no rebound.  Surgical incisions healing well no evidence of erythema or discharge  Musculoskeletal:  Chronic swelling right leg more than the left leg with dermatosis  Neurological: He is alert and oriented to person, place, and time.     Assessment/Plan Possible left pyelonephritis with mild left hydronephrosis status post recent robotic radical prostatectomy with bilateral pelvic lymphadenectomy Hypertension Acute renal failure probably secondary to prerenal azotemia/ARB use Postop anemia Chronic DVT right leg History of varicose veins stripping in the past Plan Continue IV Rocephin Check urine and blood cultures Restart heparin and Coumadin if okay with urology Continue IV hydration as per orders Check labs in a.m. Rosalva Neary N 01/10/2014, 5:59 PM

## 2014-01-10 NOTE — ED Notes (Signed)
Patient transported to CT 

## 2014-01-10 NOTE — ED Provider Notes (Signed)
CSN: [id number]     Arrival date & time 01/10/14  0804 History   First MD Initiated Contact with Patient 01/10/14 [id number]     Chief Complaint  Patient presents with  . Nausea  . Emesis     (Consider location/radiation/quality/duration/timing/severity/associated sxs/prior Treatment) HPI Comments: Patient presents to the ER for evaluation of nausea, vomiting for the last 2 days. Patient does report he had diarrhea as well, has not had any constipation. Patient denies abdominal pain associated with the symptoms. He has not had fever. Patient underwent radical prostatectomy on August 14 by Doctor Tresa Moore. He was referred to the emergency department by Doctor [facility] for further evaluation of these symptoms.  Patient is a 68 y.o. male presenting with vomiting.  Emesis Associated symptoms: diarrhea     Past Medical History  Diagnosis Date  . Hypertension   . Leg cramps   . Varicose veins     both legs  . DVT (deep venous thrombosis)     rt leg 3 yrs ago  . Swelling of lower extremity     RT LEG  . Prostate cancer   . Difficulty sleeping    Past Surgical History  Procedure Laterality Date  . Nasal sinus surgery    . Vein surgery      RT LEG  . Hernia repair  2012    INGUINAL  . Robot assisted laparoscopic radical prostatectomy N/A 12/30/2013    Procedure: ROBOTIC ASSISTED LAPAROSCOPIC RADICAL PROSTATECTOMY WITH INDOCYANINE GREEN DYE;  Surgeon: Alexis Frock, MD;  Location: [facility];  Service: Urology;  Laterality: N/A;  . Lymphadenectomy Bilateral 12/30/2013    Procedure: PELVIC LYMPH NODE DISSECTION;  Surgeon: Alexis Frock, MD;  Location: [facility];  Service: Urology;  Laterality: Bilateral;   No family history on file. History  Substance Use Topics  . Smoking status: Never Smoker   . Smokeless tobacco: Not on file  . Alcohol Use: No    Review of Systems  Gastrointestinal: Positive for nausea, vomiting and diarrhea.  All other systems reviewed and are negative.     Allergies   Review of patient's allergies indicates no known allergies.  Home Medications   Prior to Admission medications   Medication Sig Start Date End Date Taking? Authorizing Provider  cephALEXin (KEFLEX) 500 MG capsule Take 1 capsule (500 mg total) by mouth 2 (two) times daily. X 3 days. Start day before next urology appointment. 01/02/14  Yes Alexis Frock, MD  losartan (COZAAR) 100 MG tablet Take 100 mg by mouth daily.   Yes Historical Provider, MD  oxyCODONE-acetaminophen (ROXICET) 5-325 MG per tablet Take 1-2 tablets by mouth every 6 (six) hours as needed for moderate pain or severe pain. Post-operatively 01/02/14  Yes Alexis Frock, MD  senna-docusate (SENOKOT-S) 8.6-50 MG per tablet Take 1 tablet by mouth 2 (two) times daily. While taking pain meds to prevent constipation 01/02/14  Yes Alexis Frock, MD  warfarin (COUMADIN) 7.5 MG tablet Take 7.5 mg by mouth daily.   Yes Historical Provider, MD   BP 131/70  Pulse 93  Temp(Src) 98.4 F (36.9 C) (Oral)  Resp 21  Wt 259 lb (117.482 kg)  SpO2 100% Physical Exam  Constitutional: He is oriented to person, place, and time. He appears well-developed and well-nourished. No distress.  HENT:  Head: Normocephalic and atraumatic.  Right Ear: Hearing normal.  Left Ear: Hearing normal.  Nose: Nose normal.  Mouth/Throat: Oropharynx is clear and moist and mucous membranes are normal.  Eyes: Conjunctivae and EOM  are normal. Pupils are equal, round, and reactive to light.  Neck: Normal range of motion. Neck supple.  Cardiovascular: Regular rhythm, S1 normal and S2 normal.  Exam reveals no gallop and no friction rub.   No murmur heard. Pulmonary/Chest: Effort normal and breath sounds normal. No respiratory distress. He exhibits no tenderness.  Abdominal: Normal appearance and bowel sounds are normal. He exhibits distension (Slightly). There is no hepatosplenomegaly. There is no tenderness. There is no rebound, no guarding, no tenderness at McBurney's  point and negative Murphy's sign. No hernia.  Musculoskeletal: Normal range of motion.  Neurological: He is alert and oriented to person, place, and time. He has normal strength. No cranial nerve deficit or sensory deficit. Coordination normal. GCS eye subscore is 4. GCS verbal subscore is 5. GCS motor subscore is 6.  Skin: Skin is warm, dry and intact. No rash noted. No cyanosis.  Psychiatric: He has a normal mood and affect. His speech is normal and behavior is normal. Thought content normal.    ED Course  Procedures (including critical care time) Labs Review Labs Reviewed  CBC WITH DIFFERENTIAL - Abnormal; Notable for the following:    RBC 2.79 (*)    Hemoglobin 8.0 (*)    HCT 23.6 (*)    Neutrophils Relative % 81 (*)    Lymphocytes Relative 11 (*)    All other components within normal limits  COMPREHENSIVE METABOLIC PANEL - Abnormal; Notable for the following:    Sodium 134 (*)    Glucose, Bld 131 (*)    BUN 57 (*)    Creatinine, Ser 2.42 (*)    Albumin 2.9 (*)    AST 57 (*)    ALT 59 (*)    GFR calc non Af Amer 26 (*)    GFR calc Af Amer 30 (*)    Anion gap 18 (*)    All other components within normal limits  URINALYSIS, ROUTINE W REFLEX MICROSCOPIC - Abnormal; Notable for the following:    Color, Urine AMBER (*)    APPearance CLOUDY (*)    Hgb urine dipstick LARGE (*)    Protein, ur 100 (*)    Nitrite POSITIVE (*)    Leukocytes, UA MODERATE (*)    All other components within normal limits  URINE MICROSCOPIC-ADD ON - Abnormal; Notable for the following:    Squamous Epithelial / LPF FEW (*)    Bacteria, UA MANY (*)    All other components within normal limits  URINE CULTURE  CULTURE, BLOOD (ROUTINE X 2)  CULTURE, BLOOD (ROUTINE X 2)  TROPONIN I  LIPASE, BLOOD  I-STAT CG4 LACTIC ACID, ED    Imaging Review Ct Abdomen Pelvis Wo Contrast  01/10/2014   CLINICAL DATA:  Abdominal pain and vomiting; recent hernia repair and prostatectomy  EXAM: CT ABDOMEN AND PELVIS  WITHOUT CONTRAST  TECHNIQUE: Multidetector CT imaging of the abdomen and pelvis was performed following the standard protocol without IV contrast. Oral contrast was administered.  COMPARISON:  None.  FINDINGS: There is mild bibasilar atelectatic change.  No focal liver lesions are identified on this noncontrast enhanced study. Gallbladder wall is not thickened. There may be sludge in the gallbladder. There is no biliary duct dilatation.  Spleen, pancreas, and adrenals appear normal.  There is a 6 x 4 mm calculus in the periphery of the mid right kidney. There is a tiny nonobstructing calculus in the upper pole the right kidney. There is a 1 mm calculus in the mid to  lower pole right kidney. There is no right renal mass or hydronephrosis. There is no ureteral calculus on the right.  On the left, there is a 1 mm nonobstructing calculus in the medial upper to midpole region. There is slight hydronephrosis on the left. There is no left renal mass. There is no ureteral calculus on the left.  In the pelvis, the urinary bladder is decompressed with a Foley catheter. Prostate is absent. There is fat in the left inguinal ring. There is scarring in the right inguinal ring.  There is soft tissue stranding in the right abdominal wall, most likely due to postoperative change. There is no abdominal wall abscess or well-defined hematoma. There may be some liquefying hematoma in the abdominal wall on the right.  There are a few tiny foci of free intraperitoneal air which may be of postoperative etiology. There is no portal venous air. There is no bowel obstruction.  There is no appreciable ascites. There is no abscess or adenopathy in the abdomen or pelvis. Appendix appears normal.  There is no appreciable abdominal aortic aneurysm. There is slight anterolisthesis of L4 on L5, felt to be due to underlying spondylosis. No blastic or lytic bone lesions are identified.  IMPRESSION: Areas of postoperative change. Minimal free  intraperitoneal air may be residua of the recent prostatectomy. If patient has clinical symptoms persist, repeat study to assess for resolution of this minimal free intraperitoneal air would certainly be warranted. This patient should be watched closely clinically given this finding and clinical symptoms.  Mild hydronephrosis on the left without obstructing lesion seen. This finding could indicate recent calculus passage. This finding also could be indicative of a degree of pyelonephritis on the left.  No bowel obstruction.  No abscess.  Appendix region appears normal.  Possible sludge in gallbladder. Gallbladder wall not thickened. No pericholecystic fluid or obvious gallstones by CT.   Electronically Signed   By: Lowella Grip M.D.   On: 01/10/2014 13:12     EKG Interpretation   Date/Time:  Tuesday January 10 2014 09:19:51 EDT Ventricular Rate:  85 PR Interval:  133 QRS Duration: 118 QT Interval:  362 QTC Calculation: 430 R Axis:   15 Text Interpretation:  Sinus rhythm Nonspecific intraventricular conduction  delay Baseline wander in lead(s) V3 No previous tracing Confirmed by  Shakeel Disney  MD, [place] [phone]) on 01/10/2014 9:33:08 AM      MDM   Final diagnoses:  None   acute kidney injury  Pyelonephritis  Patient presents to the ER for evaluation of nausea, vomiting. Symptoms have been present for 2 days. Patient is status post radical prostatectomy on August 14. His abdominal exam was benign upon examination. He is afebrile with stable vital signs. Blood work did not show any significant leukocytosis. He does have elevated BUN and creatinine above his baseline, acute on chronic renal failure. This might be secondary to the nausea and vomiting he has been experiencing for 2 days. No electrolyte imbalance. Urinalysis is grossly abnormal consistent with infection.  CT scan was performed to further evaluate postsurgical complications. The patient has evidence of mild left hydronephrosis  without an obstructing lesion. This is concerning for possible pyelonephritis on his urinalysis. Patient treated with Rocephin, blood and urine cultures pending. Discussed with Doctor Gaynelle Arabian, covering for urology. He will consult on the patient. Patient to be admitted to medicine for further management.    Orpah Greek, MD 01/10/14 [id number]

## 2014-01-10 NOTE — Progress Notes (Signed)
Utilization Review completed.  Ryker Pherigo RN CM  

## 2014-01-10 NOTE — Consult Note (Signed)
Reason for Consult: Malaise, Renal Insufficiency, Nausea after prostatectomy  Referring Physician: M. Harwani MD  [name] is an 68 y.o. male.   HPI:   1 - High Risk Prostate Cancer - s/p robotic radical prostatectomy with bilateral pelvic lymphadenectomy 12/30/13 for pT3N0Mx adenocarcinoma.   2- Acute Renal Failure, Mild Left Hydronephrosis - Baseline Cr <1.5. Had some rise in hospital post-op with JP Cr same as serum, felt to likley represent pre-renal etiology as on recent ARB and 1.6 prior to DC 8/17. ER labs this admission with Cr 2.42 and some mild left hydro. He admits to very poor PO intake past 2 days.   3 - Nausea, Malaise  - Pt with 3 days of progressive nausea with emesis, greatly worsened after taking ABX on empty stomach yesterday. CT from ER 8/25 without obvious bowel obstruction / injury.  Today [name] is seen on consultation for above. He is being admitted to hospitalist service after recent surgery on 8/17. He was due to have office visit today for catheter removal but presnted to ER with progressive malaise and nausea as per above.    Past Medical History  Diagnosis Date  . Hypertension   . Leg cramps   . Varicose veins     both legs  . DVT (deep venous thrombosis)     rt leg 3 yrs ago  . Swelling of lower extremity     RT LEG  . Prostate cancer   . Difficulty sleeping     Past Surgical History  Procedure Laterality Date  . Nasal sinus surgery    . Vein surgery      RT LEG  . Hernia repair  2012    INGUINAL  . Robot assisted laparoscopic radical prostatectomy N/A 12/30/2013    Procedure: ROBOTIC ASSISTED LAPAROSCOPIC RADICAL PROSTATECTOMY WITH INDOCYANINE GREEN DYE;  Surgeon: Alexis Frock, MD;  Location: [facility];  Service: Urology;  Laterality: N/A;  . Lymphadenectomy Bilateral 12/30/2013    Procedure: PELVIC LYMPH NODE DISSECTION;  Surgeon: Alexis Frock, MD;  Location: [facility];  Service: Urology;  Laterality: Bilateral;    No family history on  file.  Social History:  reports that he has never smoked. He does not have any smokeless tobacco history on file. He reports that he does not drink alcohol or use illicit drugs.  Allergies: No Known Allergies  Medications: I have reviewed the patient's current medications.  Results for orders placed during the hospital encounter of 01/10/14 (from the past 48 hour(s))  CBC WITH DIFFERENTIAL     Status: Abnormal   Collection Time    01/10/14  9:16 AM      Result Value Ref Range   WBC 7.9  4.0 - 10.5 K/uL   RBC 2.79 (*) 4.22 - 5.81 MIL/uL   Hemoglobin 8.0 (*) 13.0 - 17.0 g/dL   HCT 23.6 (*) 39.0 - 52.0 %   MCV 84.6  78.0 - 100.0 fL   MCH 28.7  26.0 - 34.0 pg   MCHC 33.9  30.0 - 36.0 g/dL   RDW 14.6  11.5 - 15.5 %   Platelets 252  150 - 400 K/uL   Neutrophils Relative % 81 (*) 43 - 77 %   Neutro Abs 6.4  1.7 - 7.7 K/uL   Lymphocytes Relative 11 (*) 12 - 46 %   Lymphs Abs 0.8  0.7 - 4.0 K/uL   Monocytes Relative 7  3 - 12 %   Monocytes Absolute 0.5  0.1 - 1.0  K/uL   Eosinophils Relative 1  0 - 5 %   Eosinophils Absolute 0.1  0.0 - 0.7 K/uL   Basophils Relative 0  0 - 1 %   Basophils Absolute 0.0  0.0 - 0.1 K/uL  COMPREHENSIVE METABOLIC PANEL     Status: Abnormal   Collection Time    01/10/14  9:16 AM      Result Value Ref Range   Sodium 134 (*) 137 - 147 mEq/L   Potassium 4.3  3.7 - 5.3 mEq/L   Chloride 96  96 - 112 mEq/L   CO2 20  19 - 32 mEq/L   Glucose, Bld 131 (*) 70 - 99 mg/dL   BUN 57 (*) 6 - 23 mg/dL   Creatinine, Ser 2.42 (*) 0.50 - 1.35 mg/dL   Calcium 9.3  8.4 - 10.5 mg/dL   Total Protein 7.1  6.0 - 8.3 g/dL   Albumin 2.9 (*) 3.5 - 5.2 g/dL   AST 57 (*) 0 - 37 U/L   ALT 59 (*) 0 - 53 U/L   Alkaline Phosphatase 83  39 - 117 U/L   Total Bilirubin 0.6  0.3 - 1.2 mg/dL   GFR calc non Af Amer 26 (*) >90 mL/min   GFR calc Af Amer 30 (*) >90 mL/min   Comment: (NOTE)     The eGFR has been calculated using the CKD EPI equation.     This calculation has not been  validated in all clinical situations.     eGFR's persistently <90 mL/min signify possible Chronic Kidney     Disease.   Anion gap 18 (*) 5 - 15  TROPONIN I     Status: None   Collection Time    01/10/14  9:16 AM      Result Value Ref Range   Troponin I <0.30  <0.30 ng/mL   Comment:            Due to the release kinetics of cTnI,     a negative result within the first hours     of the onset of symptoms does not rule out     myocardial infarction with certainty.     If myocardial infarction is still suspected,     repeat the test at appropriate intervals.  LIPASE, BLOOD     Status: None   Collection Time    01/10/14  9:16 AM      Result Value Ref Range   Lipase 32  11 - 59 U/L  I-STAT CG4 LACTIC ACID, ED     Status: None   Collection Time    01/10/14  9:29 AM      Result Value Ref Range   Lactic Acid, Venous 1.16  0.5 - 2.2 mmol/L  URINALYSIS, ROUTINE W REFLEX MICROSCOPIC     Status: Abnormal   Collection Time    01/10/14 10:09 AM      Result Value Ref Range   Color, Urine AMBER (*) YELLOW   Comment: BIOCHEMICALS MAY BE AFFECTED BY COLOR   APPearance CLOUDY (*) CLEAR   Specific Gravity, Urine 1.019  1.005 - 1.030   pH 5.5  5.0 - 8.0   Glucose, UA NEGATIVE  NEGATIVE mg/dL   Hgb urine dipstick LARGE (*) NEGATIVE   Bilirubin Urine NEGATIVE  NEGATIVE   Ketones, ur NEGATIVE  NEGATIVE mg/dL   Protein, ur 100 (*) NEGATIVE mg/dL   Urobilinogen, UA 0.2  0.0 - 1.0 mg/dL   Nitrite POSITIVE (*) NEGATIVE  Leukocytes, UA MODERATE (*) NEGATIVE  URINE MICROSCOPIC-ADD ON     Status: Abnormal   Collection Time    01/10/14 10:09 AM      Result Value Ref Range   Squamous Epithelial / LPF FEW (*) RARE   WBC, UA 21-50  <3 WBC/hpf   RBC / HPF 21-50  <3 RBC/hpf   Bacteria, UA MANY (*) RARE   Urine-Other MUCOUS PRESENT      Ct Abdomen Pelvis Wo Contrast  01/10/2014   CLINICAL DATA:  Abdominal pain and vomiting; recent hernia repair and prostatectomy  EXAM: CT ABDOMEN AND PELVIS WITHOUT  CONTRAST  TECHNIQUE: Multidetector CT imaging of the abdomen and pelvis was performed following the standard protocol without IV contrast. Oral contrast was administered.  COMPARISON:  None.  FINDINGS: There is mild bibasilar atelectatic change.  No focal liver lesions are identified on this noncontrast enhanced study. Gallbladder wall is not thickened. There may be sludge in the gallbladder. There is no biliary duct dilatation.  Spleen, pancreas, and adrenals appear normal.  There is a 6 x 4 mm calculus in the periphery of the mid right kidney. There is a tiny nonobstructing calculus in the upper pole the right kidney. There is a 1 mm calculus in the mid to lower pole right kidney. There is no right renal mass or hydronephrosis. There is no ureteral calculus on the right.  On the left, there is a 1 mm nonobstructing calculus in the medial upper to midpole region. There is slight hydronephrosis on the left. There is no left renal mass. There is no ureteral calculus on the left.  In the pelvis, the urinary bladder is decompressed with a Foley catheter. Prostate is absent. There is fat in the left inguinal ring. There is scarring in the right inguinal ring.  There is soft tissue stranding in the right abdominal wall, most likely due to postoperative change. There is no abdominal wall abscess or well-defined hematoma. There may be some liquefying hematoma in the abdominal wall on the right.  There are a few tiny foci of free intraperitoneal air which may be of postoperative etiology. There is no portal venous air. There is no bowel obstruction.  There is no appreciable ascites. There is no abscess or adenopathy in the abdomen or pelvis. Appendix appears normal.  There is no appreciable abdominal aortic aneurysm. There is slight anterolisthesis of L4 on L5, felt to be due to underlying spondylosis. No blastic or lytic bone lesions are identified.  IMPRESSION: Areas of postoperative change. Minimal free intraperitoneal  air may be residua of the recent prostatectomy. If patient has clinical symptoms persist, repeat study to assess for resolution of this minimal free intraperitoneal air would certainly be warranted. This patient should be watched closely clinically given this finding and clinical symptoms.  Mild hydronephrosis on the left without obstructing lesion seen. This finding could indicate recent calculus passage. This finding also could be indicative of a degree of pyelonephritis on the left.  No bowel obstruction.  No abscess.  Appendix region appears normal.  Possible sludge in gallbladder. Gallbladder wall not thickened. No pericholecystic fluid or obvious gallstones by CT.   Electronically Signed   By: Lowella Grip M.D.   On: 01/10/2014 13:12    Review of Systems  Constitutional: Positive for malaise/fatigue. Negative for fever and chills.  HENT: Negative.   Eyes: Negative.   Respiratory: Negative.   Cardiovascular: Negative.   Gastrointestinal: Positive for nausea and vomiting. Negative for constipation and  blood in stool.  Genitourinary: Negative.  Negative for hematuria and flank pain.  Musculoskeletal: Negative.   Skin: Negative.   Neurological: Negative.   Endo/Heme/Allergies: Negative.   Psychiatric/Behavioral: Negative.    Blood pressure 131/70, pulse 93, temperature 98.4 F (36.9 C), temperature source Oral, resp. rate 21, weight 117.482 kg (259 lb), SpO2 100.00%. Physical Exam  Constitutional: He appears well-developed and well-nourished.  HENT:  Head: Normocephalic.  Eyes: Pupils are equal, round, and reactive to light.  Neck: Normal range of motion. Neck supple.  Cardiovascular: Normal rate.   Respiratory: Effort normal.  GI: Soft. Bowel sounds are normal.  Recent incision sites c/d/i. No focal TTP.  Musculoskeletal: Normal range of motion.  Neurological: He is alert.  Skin: Skin is warm and dry.  Psychiatric: He has a normal mood and affect. His behavior is normal.  Judgment and thought content normal.    Assessment/Plan:  1 - High Risk Prostate Cancer - T3 disease, will likely require additional therapy in 3-12mo after full surgical recovery. No furhter work-up or treatment this admission.  2- Acute Renal Failure, Mild Left Hydronephrosis - DDX pre-renal in setting of poor PO intake plus likely some partial obstruction due to foley balloon v. Ureteral edema near surgical anastamosis. Favor DC foley (balloon appears to be in direct apposition to UO and due for removal today anyway), hydration, and serial lab measures. Should this not improve, we may consider endoscopic evaluation or even neph tube.  3 - Nausea, Malaise  - DDX viral syndrome, azotemia, pyelonephritis. Low suspicion for bowel obstruction / injury given favorable imaging and lack of localizing symptoms and preserved BM's. Certainly agree with empiric pyelo treatment.   4 - Will follow, please call me directly with questions anytime.  [name] 01/10/2014, 3:38 PM

## 2014-01-11 LAB — BASIC METABOLIC PANEL
Anion gap: 14 (ref 5–15)
BUN: 52 mg/dL — AB (ref 6–23)
CHLORIDE: 102 meq/L (ref 96–112)
CO2: 20 mEq/L (ref 19–32)
Calcium: 8.7 mg/dL (ref 8.4–10.5)
Creatinine, Ser: 2.29 mg/dL — ABNORMAL HIGH (ref 0.50–1.35)
GFR calc Af Amer: 32 mL/min — ABNORMAL LOW (ref >90)
GFR calc non Af Amer: 27 mL/min — ABNORMAL LOW (ref >90)
Glucose, Bld: 128 mg/dL — ABNORMAL HIGH (ref 70–99)
POTASSIUM: 4 meq/L (ref 3.7–5.3)
Sodium: 136 mEq/L — ABNORMAL LOW (ref 137–147)

## 2014-01-11 LAB — CBC
HCT: 20.3 % — ABNORMAL LOW (ref 39.0–52.0)
HEMOGLOBIN: 6.8 g/dL — AB (ref 13.0–17.0)
MCH: 28.5 pg (ref 26.0–34.0)
MCHC: 33.5 g/dL (ref 30.0–36.0)
MCV: 84.9 fL (ref 78.0–100.0)
Platelets: 207 10*3/uL (ref 150–400)
RBC: 2.39 MIL/uL — AB (ref 4.22–5.81)
RDW: 14.6 % (ref 11.5–15.5)
WBC: 7.6 10*3/uL (ref 4.0–10.5)

## 2014-01-11 LAB — PROTIME-INR
INR: 1.24 (ref 0.00–1.49)
PROTHROMBIN TIME: 15.6 s — AB (ref 11.6–15.2)

## 2014-01-11 LAB — HEPARIN LEVEL (UNFRACTIONATED)
HEPARIN UNFRACTIONATED: 0.57 [IU]/mL (ref 0.30–0.70)
HEPARIN UNFRACTIONATED: 0.83 [IU]/mL — AB (ref 0.30–0.70)
HEPARIN UNFRACTIONATED: 0.83 [IU]/mL — AB (ref 0.30–0.70)

## 2014-01-11 LAB — OCCULT BLOOD X 1 CARD TO LAB, STOOL: FECAL OCCULT BLD: NEGATIVE

## 2014-01-11 LAB — PREPARE RBC (CROSSMATCH)

## 2014-01-11 MED ORDER — HEPARIN (PORCINE) IN NACL 100-0.45 UNIT/ML-% IJ SOLN
1150.0000 [IU]/h | INTRAMUSCULAR | Status: DC
Start: 2014-01-11 — End: 2014-01-13
  Administered 2014-01-12 – 2014-01-13 (×2): 1150 [IU]/h via INTRAVENOUS
  Filled 2014-01-11 (×4): qty 250

## 2014-01-11 MED ORDER — SODIUM CHLORIDE 0.9 % IV SOLN: Freq: Once | INTRAVENOUS | Status: DC

## 2014-01-11 MED ORDER — WARFARIN SODIUM 7.5 MG PO TABS
7.5000 mg | ORAL_TABLET | Freq: Once | ORAL | Status: AC
  Administered 2014-01-11: 7.5 mg via ORAL
  Filled 2014-01-11: qty 1

## 2014-01-11 MED ORDER — HEPARIN (PORCINE) IN NACL 100-0.45 UNIT/ML-% IJ SOLN
1450.0000 [IU]/h | INTRAMUSCULAR | Status: DC
  Filled 2014-01-11: qty 250

## 2014-01-11 MED ORDER — ENSURE COMPLETE PO LIQD
237.0000 mL | ORAL | Status: DC
  Administered 2014-01-11 – 2014-01-14 (×3): 237 mL via ORAL

## 2014-01-11 NOTE — Progress Notes (Signed)
Pt. Was administered one unit of RBC's. Pt. Tolerated procedure well. Will continue to monitor.

## 2014-01-11 NOTE — Progress Notes (Signed)
ANTICOAGULATION CONSULT NOTE - Follow up  Pharmacy Consult for Heparin/Warfarin Indication: VTE prophylaxis  No Known Allergies  Patient Measurements: Height: 6\' 2"  (188 cm) Weight: 244 lb 0.8 oz (110.7 kg) IBW/kg (Calculated) : 82.2 Heparin Dosing Weight: 105 kg  Vital Signs: Temp: 97.4 F (36.3 C) (08/26 1318) Temp src: Oral (08/26 1318) BP: 136/68 mmHg (08/26 1318) Pulse Rate: 86 (08/26 1318)  Labs:  Recent Labs  01/10/14 0916 01/10/14 2340 01/11/14 0419 01/11/14 0820 01/11/14 1410 01/11/14 2140  HGB 8.0*  --  6.8*  --   --   --   HCT 23.6*  --  20.3*  --   --   --   PLT 252  --  207  --   --   --   APTT  --  23*  --   --   --   --   LABPROT  --  15.0  --  15.6*  --   --   INR  --  1.18  --  1.24  --   --   HEPARINUNFRC  --   --   --  0.57 0.83* 0.83*  CREATININE 2.42*  --  2.29*  --   --   --   TROPONINI <0.30  --   --   --   --   --     Estimated Creatinine Clearance: 40.3 ml/min (by C-G formula based on Cr of 2.29).   Medical History: Past Medical History  Diagnosis Date  . Hypertension   . Leg cramps   . Varicose veins     both legs  . DVT (deep venous thrombosis)     rt leg 3 yrs ago  . Swelling of lower extremity     RT LEG  . Prostate cancer   . Difficulty sleeping     Assessment: 67yoM with T3 prostate cancer, s/p radical prostatectomy w/ bilateral pelvic lymphadenectomy 8/14, admitted 8/25 for nausea/emesis and AKI.  On warfarin at home d/t Hx DVT in 2012; held prior to procedure 8/14 but never resumed d/t post-op anemia/blood loss.  Given clearance to resume anticoagulation per urology on this admission; requested bridge to warfarin with heparin per cardiology.   Plt wnl, slight decrease since admission  Hgb decreased to 6.8 this AM, was 8.0 on admission, patient has received 1 unit of PRBC today without issue  Patient noted some blood-tinged sputum this AM, no other complaints of bleeding since  Per discussion with cardiologist this  AM, ok to continue heparin gtt and warfarin dosing Stool is being tested for occult blood  Stool was tested for occult blood, found to be negative  SCr has improved since admission, remains above baseline, CrCl 40 ml/hr  HL= 0.83 x 2 even after 200 unit/hr decrease earlier today ??  RN confirmed with lab drawn from opposite side as heparin drip  No IV infusion problems or complaints of bleeding reported  Goal of Therapy:  INR 2-3 Heparin level 0.3-0.7 units/ml Monitor platelets by anticoagulation protocol: Yes   Plan:   Decrease heparin drip to 1150 units/hr  Check anti-Xa level in 6 hours and daily while on heparin  Daily CBC while on heparin; follow CBC closely   [facility]: [phone] 01/11/2014 10:29 PM

## 2014-01-11 NOTE — Progress Notes (Signed)
INITIAL NUTRITION ASSESSMENT  DOCUMENTATION CODES Per approved criteria  -Non-severe (moderate) malnutrition in the context of acute illness or injury  Pt meets criteria for moderate MALNUTRITION in the context of acute illness as evidenced by PO intake < 75% for > 7 days, 4% body weight loss in 2 weeks.   INTERVENTION: -Recommend vanilla Ensure Complete once daily -Reviewed nutrition therapy for nausea/vomiting -Will continue to monitor  NUTRITION DIAGNOSIS: Inadequate oral intake related to nausea/early satiety/taste changes as evidenced by PO intake < 75% for two weeks, 10-15 lb weight loss.   Goal: Pt to meet >/= 90% of their estimated nutrition needs    Monitor:  Total protein/energy intake, labs, weights, GI profile  Reason for Assessment: MST  68 y.o. 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 Diet recall indicated pt consuming soups, and bland soft foods (mashed potatoes, rice) as pt with large amount of nausea and vomiting. -Endorsed an unintentional wt loss of 10-15 lbs in past 2 weeks, some of  which pt attributed to fluids (4% body weight loss, significant for time frame) -Reported feelings of abd bloating, loose stools, and some taste changes that have also inhibited PO intake.  -Current PO intake 50%, nausea is  improved with medication -Was willing to trial Ensure Complete once daily until appetite returns to baseline as pt's wife reported pt with excellent appetite prior to procedure  Height: Ht Readings from Last 1 Encounters:  01/10/14 6\' 2"  (1.88 m)    Weight: Wt Readings from Last 1 Encounters:  01/10/14 244 lb 0.8 oz (110.7 kg)    Ideal Body Weight: 190 lbs  % Ideal Body Weight: 128%  Wt Readings from Last 10 Encounters:  01/10/14 244 lb 0.8 oz (110.7 kg)  12/30/13 259 lb (117.482 kg)  12/30/13 259 lb (117.482 kg)  12/21/13 259 lb (117.482 kg)  03/07/13 253 lb (114.76 kg)    Usual Body Weight: 255-260 lbs  % Usual Body Weight: 96%  BMI:  Body mass index is 31.32 kg/(m^2).  Estimated Nutritional Needs: Kcal: 2831-5176 Protein: 100-110 gram Fluid: per MD  Skin: +2 RLE and +2 LLE edemas  Diet Order: Cardiac  EDUCATION NEEDS: -Education needs addressed   Intake/Output Summary (Last 24 hours) at 01/11/14 1344 Last data filed at 01/11/14 1318  Gross per 24 hour  Intake 2091.67 ml  Output    275 ml  Net 1816.67 ml    Last BM: 8/26   Labs:   Recent Labs Lab 01/10/14 0916 01/11/14 0419  NA 134* 136*  K 4.3 4.0  CL 96 102  CO2 20 20  BUN 57* 52*  CREATININE 2.42* 2.29*  CALCIUM 9.3 8.7  GLUCOSE 131* 128*    CBG (last 3)  No results found for this basename: GLUCAP,  in the last 72 hours  Scheduled Meds: . sodium chloride   Intravenous Once  . cefTRIAXone (ROCEPHIN)  IV  1 g  Intravenous Q24H  . feeding supplement (ENSURE COMPLETE)  237 mL Oral Q24H  . ferrous sulfate  325 mg Oral TID WC  . pantoprazole  40 mg Oral Daily  . senna-docusate  1 tablet Oral BID  . warfarin  7.5 mg Oral ONCE-1800  . Warfarin - Pharmacist Dosing Inpatient   Does not apply q1800    Continuous Infusions: . sodium chloride 75 mL/hr at 01/10/14 1845  . heparin 1,650 Units/hr (01/11/14 1309)    Past Medical History  Diagnosis Date  . Hypertension   . Leg cramps   .  Varicose veins     both legs  . DVT (deep venous thrombosis)     rt leg 3 yrs ago  . Swelling of lower extremity     RT LEG  . Prostate cancer   . Difficulty sleeping     Past Surgical History  Procedure Laterality Date  . Nasal sinus surgery    . Vein surgery      RT LEG  . Hernia repair  2012    INGUINAL  . Robot assisted laparoscopic radical prostatectomy N/A 12/30/2013    Procedure: ROBOTIC ASSISTED LAPAROSCOPIC RADICAL PROSTATECTOMY WITH INDOCYANINE GREEN DYE;  Surgeon: Alexis Frock, MD;  Location: [facility];  Service: Urology;  Laterality: N/A;  . Lymphadenectomy Bilateral 12/30/2013    Procedure: PELVIC LYMPH NODE DISSECTION;  Surgeon: Alexis Frock, MD;  Location: [facility];  Service: Urology;  Laterality: Bilateral;    [place] LDN Clinical Dietitian [phone]

## 2014-01-11 NOTE — Progress Notes (Signed)
CRITICAL VALUE ALERT  Critical value received: HGB 6.8  Date of notification:  01/11/14  Time of notification: 0450  Critical value read back:Yes Nurse who received alert:  Azzie Glatter, RN  MD notified (1st page): Harwani  Time of first page:  0450  MD notified (2nd page):n/a  Time of second page:n/a  Responding MD:  Terrence Dupont  Time MD responded:  0500; orders received.

## 2014-01-11 NOTE — Progress Notes (Signed)
Dr. Terrence Dupont asked RN to contact urologist and verify that it was okay to restart heparin and coumadin.  On call urologist was called and MD stated that it was okay to start heparin and coumadin at this time.

## 2014-01-11 NOTE — Progress Notes (Signed)
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 Foley removed 5/25.   2- Acute Renal Failure, Mild Left Hydronephrosis - Baseline Cr <1.5. Had some rise in hospital post-op with JP Cr same as serum, felt to likley represent pre-renal etiology as on recent ARB and 1.6 prior to DC 8/17. ER labs this admission with Cr 2.42 and some mild left hydro. He admits to very poor PO intake past 2 days prior to admission. Denies flank pain.   3 - Nausea, Malaise - Pt with 3 days of progressive nausea with emesis, greatly worsened after taking ABX on empty stomach before admission. CT from ER 8/25 without obvious bowel obstruction / injury. UA with pyuria, bacteruria.  Today [name] is feeling much improved. Keeping PO diet down, no nausea / emesis today. He did receive some blood for slowly progressive anemia and began bridge back to anticoagulation given his h/o DVT. It is his 48th anniversary today.    Objective: Vital signs in last 24 hours: Temp:  [97.4 F (36.3 C)-97.9 F (36.6 C)] 97.4 F (36.3 C) (08/26 1318) Pulse Rate:  [76-95] 86 (08/26 1318) Resp:  [20] 20 (08/26 1318) BP: (117-138)/(52-68) 136/68 mmHg (08/26 1318) SpO2:  [99 %-100 %] 100 % (08/26 1318) Last BM Date: 01/11/14  Intake/Output from previous day: 08/25 0701 - 08/26 0700 In: 1516.7 [I.V.:1516.7] Out: 100 [Urine:100] Intake/Output this shift: Total I/O In: 575 [P.O.:240; Blood:335] Out: 175 [Urine:175]  General appearance: alert, cooperative and appears stated age Nose: Nares normal. Septum midline. Mucosa normal. No drainage or sinus tenderness. Throat: lips, mucosa, and tongue normal; teeth and gums normal Neck: supple, symmetrical, trachea midline Back: symmetric, no curvature. ROM normal. No CVA tenderness. Resp: non-labored on room air Cardio: Nl rate GI: soft, non-tender; bowel sounds normal; no masses,  no  organomegaly Male genitalia: normal, in adult continence briefs.  Extremities: extremities normal, atraumatic, no cyanosis or edema Pulses: 2+ and symmetric Skin: Skin color, texture, turgor normal. No rashes or lesions Lymph nodes: Cervical, supraclavicular, and axillary nodes normal. Neurologic: Grossly normal Incision/Wound: Recent surgical sites c/d/i. No hernias or point tenderness.  Lab Results:   Recent Labs  01/10/14 0916 01/11/14 0419  WBC 7.9 7.6  HGB 8.0* 6.8*  HCT 23.6* 20.3*  PLT 252 207   BMET  Recent Labs  01/10/14 0916 01/11/14 0419  NA 134* 136*  K 4.3 4.0  CL 96 102  CO2 20 20  GLUCOSE 131* 128*  BUN 57* 52*  CREATININE 2.42* 2.29*  CALCIUM 9.3 8.7   PT/INR  Recent Labs  01/10/14 2340 01/11/14 0820  LABPROT 15.0 15.6*  INR 1.18 1.24   ABG No results found for this basename: PHART, PCO2, PO2, HCO3,  in the last 72 hours  Studies/Results: Ct Abdomen Pelvis Wo Contrast  01/10/2014   CLINICAL DATA:  Abdominal pain and vomiting; recent hernia repair and prostatectomy  EXAM: CT ABDOMEN AND PELVIS WITHOUT CONTRAST  TECHNIQUE: Multidetector CT imaging of the abdomen and pelvis was performed following the standard protocol without IV contrast. Oral contrast was administered.  COMPARISON:  None.  FINDINGS: There is mild bibasilar atelectatic change.  No focal liver lesions are identified on this noncontrast enhanced study. Gallbladder wall is not thickened. There may be sludge in the gallbladder. There is no biliary duct dilatation.  Spleen, pancreas, and adrenals appear normal.  There is a 6 x 4 mm calculus in the periphery of  the mid right kidney. There is a tiny nonobstructing calculus in the upper pole the right kidney. There is a 1 mm calculus in the mid to lower pole right kidney. There is no right renal mass or hydronephrosis. There is no ureteral calculus on the right.  On the left, there is a 1 mm nonobstructing calculus in the medial upper to midpole  region. There is slight hydronephrosis on the left. There is no left renal mass. There is no ureteral calculus on the left.  In the pelvis, the urinary bladder is decompressed with a Foley catheter. Prostate is absent. There is fat in the left inguinal ring. There is scarring in the right inguinal ring.  There is soft tissue stranding in the right abdominal wall, most likely due to postoperative change. There is no abdominal wall abscess or well-defined hematoma. There may be some liquefying hematoma in the abdominal wall on the right.  There are a few tiny foci of free intraperitoneal air which may be of postoperative etiology. There is no portal venous air. There is no bowel obstruction.  There is no appreciable ascites. There is no abscess or adenopathy in the abdomen or pelvis. Appendix appears normal.  There is no appreciable abdominal aortic aneurysm. There is slight anterolisthesis of L4 on L5, felt to be due to underlying spondylosis. No blastic or lytic bone lesions are identified.  IMPRESSION: Areas of postoperative change. Minimal free intraperitoneal air may be residua of the recent prostatectomy. If patient has clinical symptoms persist, repeat study to assess for resolution of this minimal free intraperitoneal air would certainly be warranted. This patient should be watched closely clinically given this finding and clinical symptoms.  Mild hydronephrosis on the left without obstructing lesion seen. This finding could indicate recent calculus passage. This finding also could be indicative of a degree of pyelonephritis on the left.  No bowel obstruction.  No abscess.  Appendix region appears normal.  Possible sludge in gallbladder. Gallbladder wall not thickened. No pericholecystic fluid or obvious gallstones by CT.   Electronically Signed   By: Lowella Grip M.D.   On: 01/10/2014 13:12    Anti-infectives: Anti-infectives   Start     Dose/Rate Route Frequency Ordered Stop   01/11/14 1600   cefTRIAXone (ROCEPHIN) 1 g in dextrose 5 % 50 mL IVPB     1 g 100 mL/hr over 30 Minutes Intravenous Every 24 hours 01/10/14 1525     01/10/14 1500  cefTRIAXone (ROCEPHIN) 1 g in dextrose 5 % 50 mL IVPB     1 g 100 mL/hr over 30 Minutes Intravenous  Once 01/10/14 1457 01/10/14 1530      Assessment/Plan:  1 - High Risk Prostate Cancer - no further intervention this admission. Keep foley out. He is experiencing stress incontinence as expected at this timepoint.   2- Acute Renal Failure, Mild Left Hydronephrosis - DDX pre renal and/or left ureteral edema / balloon obstruction. Agree with hydration and serial labs.   3 - Nausea, Malaise - improved with hydration and empiric ABX for possible pyelonephritis.  Greatly appreciate hospitalist input and care.  Will follow.   [facility], Brianda Beitler 01/11/2014

## 2014-01-11 NOTE — Progress Notes (Signed)
Subjective:  Patient denies any abdominal pain nausea vomiting or diarrhea. Denies any urinary complaints states feels better denies any bright red blood per rectum or melena denies hematuria. States noted streaks of blood with coughing. No obvious hemoptysis. Dropped hemoglobin to 6.8 probably secondary to hydration. Will be receiving 1 unit of packed RBC renal function slowly improving we'll continue a slow hydration  Objective:  Vital Signs in the last 24 hours: Temp:  [97.6 F (36.4 C)-97.9 F (36.6 C)] 97.9 F (36.6 C) (08/26 0705) Pulse Rate:  [82-93] 87 (08/26 0705) Resp:  [17-23] 20 (08/26 0705) BP: (123-159)/(52-74) 123/52 mmHg (08/26 0705) SpO2:  [98 %-100 %] 100 % (08/26 0705) Weight:  [110.7 kg (244 lb 0.8 oz)] 110.7 kg (244 lb 0.8 oz) (08/25 1602)  Intake/Output from previous day: 08/25 0701 - 08/26 0700 In: 1516.7 [I.V.:1516.7] Out: 100 [Urine:100] Intake/Output from this shift:    Physical Exam: Neck: no adenopathy, no carotid bruit, no JVD and supple, symmetrical, trachea midline Lungs: clear to auscultation bilaterally Heart: regular rate and rhythm, S1, S2 normal, no murmur, click, rub or gallop Abdomen: soft, non-tender; bowel sounds normal; no masses,  no organomegaly Extremities: No clubbing cyanosis 1+ edema noted right more than left  Lab Results:  Recent Labs  01/10/14 0916 01/11/14 0419  WBC 7.9 7.6  HGB 8.0* 6.8*  PLT 252 207    Recent Labs  01/10/14 0916 01/11/14 0419  NA 134* 136*  K 4.3 4.0  CL 96 102  CO2 20 20  GLUCOSE 131* 128*  BUN 57* 52*  CREATININE 2.42* 2.29*    Recent Labs  01/10/14 0916  TROPONINI <0.30   Hepatic Function Panel  Recent Labs  01/10/14 0916  PROT 7.1  ALBUMIN 2.9*  AST 57*  ALT 59*  ALKPHOS 83  BILITOT 0.6   No results found for this basename: CHOL,  in the last 72 hours No results found for this basename: PROTIME,  in the last 72 hours  Imaging: Imaging results have been reviewed and Ct  Abdomen Pelvis Wo Contrast  01/10/2014   CLINICAL DATA:  Abdominal pain and vomiting; recent hernia repair and prostatectomy  EXAM: CT ABDOMEN AND PELVIS WITHOUT CONTRAST  TECHNIQUE: Multidetector CT imaging of the abdomen and pelvis was performed following the standard protocol without IV contrast. Oral contrast was administered.  COMPARISON:  None.  FINDINGS: There is mild bibasilar atelectatic change.  No focal liver lesions are identified on this noncontrast enhanced study. Gallbladder wall is not thickened. There may be sludge in the gallbladder. There is no biliary duct dilatation.  Spleen, pancreas, and adrenals appear normal.  There is a 6 x 4 mm calculus in the periphery of the mid right kidney. There is a tiny nonobstructing calculus in the upper pole the right kidney. There is a 1 mm calculus in the mid to lower pole right kidney. There is no right renal mass or hydronephrosis. There is no ureteral calculus on the right.  On the left, there is a 1 mm nonobstructing calculus in the medial upper to midpole region. There is slight hydronephrosis on the left. There is no left renal mass. There is no ureteral calculus on the left.  In the pelvis, the urinary bladder is decompressed with a Foley catheter. Prostate is absent. There is fat in the left inguinal ring. There is scarring in the right inguinal ring.  There is soft tissue stranding in the right abdominal wall, most likely due to postoperative change. There  is no abdominal wall abscess or well-defined hematoma. There may be some liquefying hematoma in the abdominal wall on the right.  There are a few tiny foci of free intraperitoneal air which may be of postoperative etiology. There is no portal venous air. There is no bowel obstruction.  There is no appreciable ascites. There is no abscess or adenopathy in the abdomen or pelvis. Appendix appears normal.  There is no appreciable abdominal aortic aneurysm. There is slight anterolisthesis of L4 on L5,  felt to be due to underlying spondylosis. No blastic or lytic bone lesions are identified.  IMPRESSION: Areas of postoperative change. Minimal free intraperitoneal air may be residua of the recent prostatectomy. If patient has clinical symptoms persist, repeat study to assess for resolution of this minimal free intraperitoneal air would certainly be warranted. This patient should be watched closely clinically given this finding and clinical symptoms.  Mild hydronephrosis on the left without obstructing lesion seen. This finding could indicate recent calculus passage. This finding also could be indicative of a degree of pyelonephritis on the left.  No bowel obstruction.  No abscess.  Appendix region appears normal.  Possible sludge in gallbladder. Gallbladder wall not thickened. No pericholecystic fluid or obvious gallstones by CT.   Electronically Signed   By: Lowella Grip M.D.   On: 01/10/2014 13:12    Cardiac Studies:  Assessment/Plan:  Possible left pyelonephritis with mild left hydronephrosis status post recent robotic radical prostatectomy with bilateral pelvic lymphadenectomy  Hypertension  Acute renal failure probably secondary to prerenal azotemia/ARB use  Postop anemia  Chronic DVT right leg  History of varicose veins stripping in the past  Plan Continue present management Transfuse one unit of packed RBCs Check labs in a.m.  LOS: 1 day    Jaquavius Hudler N 01/11/2014, 8:45 AM

## 2014-01-11 NOTE — Progress Notes (Signed)
ANTICOAGULATION CONSULT NOTE - Follow up  Pharmacy Consult for Heparin/Warfarin Indication: VTE prophylaxis  No Known Allergies  Patient Measurements: Height: 6\' 2"  (188 cm) Weight: 244 lb 0.8 oz (110.7 kg) IBW/kg (Calculated) : 82.2 Heparin Dosing Weight: 105 kg  Vital Signs: Temp: 97.4 F (36.3 C) (08/26 1318) Temp src: Oral (08/26 1318) BP: 136/68 mmHg (08/26 1318) Pulse Rate: 86 (08/26 1318)  Labs:  Recent Labs  01/10/14 0916 01/10/14 2340 01/11/14 0419 01/11/14 0820 01/11/14 1410  HGB 8.0*  --  6.8*  --   --   HCT 23.6*  --  20.3*  --   --   PLT 252  --  207  --   --   APTT  --  23*  --   --   --   LABPROT  --  15.0  --  15.6*  --   INR  --  1.18  --  1.24  --   HEPARINUNFRC  --   --   --  0.57 0.83*  CREATININE 2.42*  --  2.29*  --   --   TROPONINI <0.30  --   --   --   --     Estimated Creatinine Clearance: 40.3 ml/min (by C-G formula based on Cr of 2.29).   Medical History: Past Medical History  Diagnosis Date  . Hypertension   . Leg cramps   . Varicose veins     both legs  . DVT (deep venous thrombosis)     rt leg 3 yrs ago  . Swelling of lower extremity     RT LEG  . Prostate cancer   . Difficulty sleeping     Assessment: 67yoM with T3 prostate cancer, s/p radical prostatectomy w/ bilateral pelvic lymphadenectomy 8/14, admitted 8/25 for nausea/emesis and AKI.  On warfarin at home d/t Hx DVT in 2012; held prior to procedure 8/14 but never resumed d/t post-op anemia/blood loss.  Given clearance to resume anticoagulation per urology on this admission; requested bridge to warfarin with heparin per cardiology.   Heparin level recheck above therapeutic range at 0.83 on 1650 units/hr  Plt wnl, slight decrease since admission  Hgb decreased to 6.8 this AM, was 8.0 on admission, patient has received 1 unit of PRBC today without issue  Patient noted some blood-tinged sputum this AM, no other complaints of bleeding since  Per discussion with  cardiologist this AM, ok to continue heparin gtt and warfarin dosing Stool is being tested for occult blood  Stool was tested for occult blood, found to be negative  SCr has improved since admission, remains above baseline, CrCl 40 ml/hr  Goal of Therapy:  INR 2-3 Heparin level 0.3-0.7 units/ml Monitor platelets by anticoagulation protocol: Yes   Plan:   Decrease heparin gtt to 1450 units/hr  Check anti-Xa level in 6 hours and daily while on heparin  Daily CBC while on heparin; follow CBC closely  Thank you for the consult.  Currie Paris, PharmD, BCPS Pager: [phone] Pharmacy: [phone] 01/11/2014 2:41 PM

## 2014-01-11 NOTE — Progress Notes (Signed)
ANTICOAGULATION CONSULT NOTE - Follow up  Pharmacy Consult for Heparin/Warfarin Indication: VTE prophylaxis  No Known Allergies  Patient Measurements: Height: 6\' 2"  (188 cm) Weight: 244 lb 0.8 oz (110.7 kg) IBW/kg (Calculated) : 82.2 Heparin Dosing Weight: 105 kg  Vital Signs: Temp: 97.9 F (36.6 C) (08/26 0705) Temp src: Oral (08/26 0705) BP: 123/52 mmHg (08/26 0705) Pulse Rate: 87 (08/26 0705)  Labs:  Recent Labs  01/10/14 0916 01/10/14 2340 01/11/14 0419  HGB 8.0*  --  6.8*  HCT 23.6*  --  20.3*  PLT 252  --  207  APTT  --  23*  --   LABPROT  --  15.0  --   INR  --  1.18  --   CREATININE 2.42*  --  2.29*  TROPONINI <0.30  --   --     Estimated Creatinine Clearance: 40.3 ml/min (by C-G formula based on Cr of 2.29).   Medical History: Past Medical History  Diagnosis Date  . Hypertension   . Leg cramps   . Varicose veins     both legs  . DVT (deep venous thrombosis)     rt leg 3 yrs ago  . Swelling of lower extremity     RT LEG  . Prostate cancer   . Difficulty sleeping     Assessment: 67yoM with T3 prostate cancer, s/p radical prostatectomy w/ bilateral pelvic lymphadenectomy 8/14, admitted 8/25 for nausea/emesis and AKI.  On warfarin at home d/t Hx DVT in 2012; held prior to procedure 8/14 but never resumed d/t post-op anemia/blood loss.  Given clearance to resume anticoagulation per urology on this admission; requested bridge to warfarin with heparin per cardiology.   Home warfarin dose 7.5mg  daily, last dose 8/9  INR on admission below therapeutic range at 1.18  This is expected as patient had not yet resumed warfarin dosing  INR today 1.24 after 7.5mg  warfarin given last night  1st check of heparin level this AM within therapeutic range at 0.57 on 1650 units/hr  Plt wnl, slight decrease since admission  Hgb decreased to 6.8 this AM, was 8.0 on admission, 1 unit of PRBC has been ordered  Patient noted some blood-tinged sputum this AM  Per  discussion with cardiologist this AM, ok to continue heparin gtt and warfarin dosing  Stool is being tested for occult blood  SCr has improved since admission, remains above baseline, CrCl 40 ml/hr  Goal of Therapy:  INR 2-3 Heparin level 0.3-0.7 units/ml Monitor platelets by anticoagulation protocol: Yes   Plan:   Continue heparin gtt at 1650 units/hr  Warfarin 7.5mg  PO x 1 tonight  Check anti-Xa level in 6 hours and daily while on heparin  Daily CBC while on heparin; follow CBC closely  Daily INR  Thank you for the consult.  Currie Paris, PharmD, BCPS Pager: [phone] Pharmacy: [phone] 01/11/2014 8:52 AM

## 2014-01-12 ENCOUNTER — Inpatient Hospital Stay (HOSPITAL_COMMUNITY): Payer: Medicare Other

## 2014-01-12 DIAGNOSIS — E440 Moderate protein-calorie malnutrition: Secondary | ICD-10-CM | POA: Insufficient documentation

## 2014-01-12 LAB — CBC
HCT: 25.4 % — ABNORMAL LOW (ref 39.0–52.0)
Hemoglobin: 8.3 g/dL — ABNORMAL LOW (ref 13.0–17.0)
MCH: 28.3 pg (ref 26.0–34.0)
MCHC: 32.7 g/dL (ref 30.0–36.0)
MCV: 86.7 fL (ref 78.0–100.0)
PLATELETS: 242 10*3/uL (ref 150–400)
RBC: 2.93 MIL/uL — ABNORMAL LOW (ref 4.22–5.81)
RDW: 14.7 % (ref 11.5–15.5)
WBC: 8 10*3/uL (ref 4.0–10.5)

## 2014-01-12 LAB — BASIC METABOLIC PANEL
Anion gap: 14 (ref 5–15)
BUN: 43 mg/dL — ABNORMAL HIGH (ref 6–23)
CO2: 19 mEq/L (ref 19–32)
Calcium: 8.9 mg/dL (ref 8.4–10.5)
Chloride: 101 mEq/L (ref 96–112)
Creatinine, Ser: 2.22 mg/dL — ABNORMAL HIGH (ref 0.50–1.35)
GFR calc Af Amer: 33 mL/min — ABNORMAL LOW (ref >90)
GFR calc non Af Amer: 28 mL/min — ABNORMAL LOW (ref >90)
Glucose, Bld: 113 mg/dL — ABNORMAL HIGH (ref 70–99)
Potassium: 4.1 mEq/L (ref 3.7–5.3)
Sodium: 134 mEq/L — ABNORMAL LOW (ref 137–147)

## 2014-01-12 LAB — URINE CULTURE: Colony Count: >=100000

## 2014-01-12 LAB — HEPARIN LEVEL (UNFRACTIONATED): Heparin Unfractionated: 0.63 IU/mL (ref 0.30–0.70)

## 2014-01-12 LAB — PROTIME-INR
INR: 1.16 (ref 0.00–1.49)
Prothrombin Time: 14.8 seconds (ref 11.6–15.2)

## 2014-01-12 LAB — TYPE AND SCREEN
ABO/RH(D): A POS
Antibody Screen: NEGATIVE
Unit division: 0

## 2014-01-12 MED ORDER — WARFARIN SODIUM 10 MG PO TABS
10.0000 mg | ORAL_TABLET | Freq: Once | ORAL | Status: AC
  Administered 2014-01-12: 10 mg via ORAL
  Filled 2014-01-12: qty 1

## 2014-01-12 NOTE — Progress Notes (Signed)
Subjective:  Patient denies any chest pain or shortness of breath complaints of incontinence of urine. Also complains of leg swelling. Denies any fever or chills. No further hemoptysis. Urine culture positive for gram-negative rods Final cultures still pending. Blood cultures so far negative  Objective:  Vital Signs in the last 24 hours: Temp:  [97.1 F (36.2 C)-97.9 F (36.6 C)] 97.9 F (36.6 C) (08/27 0545) Pulse Rate:  [76-91] 85 (08/27 0545) Resp:  [20] 20 (08/27 0545) BP: (117-138)/(60-68) 134/66 mmHg (08/27 0545) SpO2:  [100 %] 100 % (08/27 0545) Weight:  [111.086 kg (244 lb 14.4 oz)] 111.086 kg (244 lb 14.4 oz) (08/27 0545)  Intake/Output from previous day: 08/26 0701 - 08/27 0700 In: 2853.8 [P.O.:240; I.V.:2228.8; Blood:335; IV Piggyback:50] Out: 350 [Urine:350] Intake/Output from this shift:    Physical Exam: Neck: no adenopathy, no carotid bruit, no JVD and supple, symmetrical, trachea midline Lungs: clear to auscultation bilaterally Heart: regular rate and rhythm, S1, S2 normal, no murmur, click, rub or gallop Abdomen: soft, non-tender; bowel sounds normal; no masses,  no organomegaly Extremities: no clubbing cyanosis 2+ edema right more than left noted  Lab Results:  Recent Labs  01/11/14 0419 01/12/14 0632  WBC 7.6 8.0  HGB 6.8* 8.3*  PLT 207 242    Recent Labs  01/11/14 0419 01/12/14 0632  NA 136* 134*  K 4.0 4.1  CL 102 101  CO2 20 19  GLUCOSE 128* 113*  BUN 52* 43*  CREATININE 2.29* 2.22*    Recent Labs  01/10/14 0916  TROPONINI <0.30   Hepatic Function Panel  Recent Labs  01/10/14 0916  PROT 7.1  ALBUMIN 2.9*  AST 57*  ALT 59*  ALKPHOS 83  BILITOT 0.6   No results found for this basename: CHOL,  in the last 72 hours No results found for this basename: PROTIME,  in the last 72 hours  Imaging: Imaging results have been reviewed and Ct Abdomen Pelvis Wo Contrast  01/10/2014   CLINICAL DATA:  Abdominal pain and vomiting; recent  hernia repair and prostatectomy  EXAM: CT ABDOMEN AND PELVIS WITHOUT CONTRAST  TECHNIQUE: Multidetector CT imaging of the abdomen and pelvis was performed following the standard protocol without IV contrast. Oral contrast was administered.  COMPARISON:  None.  FINDINGS: There is mild bibasilar atelectatic change.  No focal liver lesions are identified on this noncontrast enhanced study. Gallbladder wall is not thickened. There may be sludge in the gallbladder. There is no biliary duct dilatation.  Spleen, pancreas, and adrenals appear normal.  There is a 6 x 4 mm calculus in the periphery of the mid right kidney. There is a tiny nonobstructing calculus in the upper pole the right kidney. There is a 1 mm calculus in the mid to lower pole right kidney. There is no right renal mass or hydronephrosis. There is no ureteral calculus on the right.  On the left, there is a 1 mm nonobstructing calculus in the medial upper to midpole region. There is slight hydronephrosis on the left. There is no left renal mass. There is no ureteral calculus on the left.  In the pelvis, the urinary bladder is decompressed with a Foley catheter. Prostate is absent. There is fat in the left inguinal ring. There is scarring in the right inguinal ring.  There is soft tissue stranding in the right abdominal wall, most likely due to postoperative change. There is no abdominal wall abscess or well-defined hematoma. There may be some liquefying hematoma in the abdominal  wall on the right.  There are a few tiny foci of free intraperitoneal air which may be of postoperative etiology. There is no portal venous air. There is no bowel obstruction.  There is no appreciable ascites. There is no abscess or adenopathy in the abdomen or pelvis. Appendix appears normal.  There is no appreciable abdominal aortic aneurysm. There is slight anterolisthesis of L4 on L5, felt to be due to underlying spondylosis. No blastic or lytic bone lesions are identified.   IMPRESSION: Areas of postoperative change. Minimal free intraperitoneal air may be residua of the recent prostatectomy. If patient has clinical symptoms persist, repeat study to assess for resolution of this minimal free intraperitoneal air would certainly be warranted. This patient should be watched closely clinically given this finding and clinical symptoms.  Mild hydronephrosis on the left without obstructing lesion seen. This finding could indicate recent calculus passage. This finding also could be indicative of a degree of pyelonephritis on the left.  No bowel obstruction.  No abscess.  Appendix region appears normal.  Possible sludge in gallbladder. Gallbladder wall not thickened. No pericholecystic fluid or obvious gallstones by CT.   Electronically Signed   By: Lowella Grip M.D.   On: 01/10/2014 13:12    Cardiac Studies:  Assessment/Plan:  Possible left pyelonephritis with mild left hydronephrosis status post recent robotic radical prostatectomy with bilateral pelvic lymphadenectomy  Hypertension  Acute renal failure probably secondary to prerenal azotemia/ARB use  Postop anemia  Chronic DVT right leg  History of varicose veins stripping in the past  Plan Reduce her fluids as per orders Elevated legs 30 while in bed TED stockings Check cultures OT PT consult Ambulate as tolerated  LOS: 2 days    Roque Schill N 01/12/2014, 10:15 AM

## 2014-01-12 NOTE — Progress Notes (Addendum)
ANTICOAGULATION CONSULT NOTE - Follow up  Pharmacy Consult for Heparin/Warfarin Indication: chronic Warfarin for hx DVT  No Known Allergies  Patient Measurements: Height: 6\' 2"  (188 cm) Weight: 244 lb 14.4 oz (111.086 kg) IBW/kg (Calculated) : 82.2 Heparin Dosing Weight: 105 kg  Vital Signs: Temp: 97.9 F (36.6 C) (08/27 0545) Temp src: Oral (08/27 0545) BP: 134/66 mmHg (08/27 0545) Pulse Rate: 85 (08/27 0545)  Labs:  Recent Labs  01/10/14 0916 01/10/14 2340 01/11/14 0419  01/11/14 0820 01/11/14 1410 01/11/14 2140 01/12/14 0632  HGB 8.0*  --  6.8*  --   --   --   --  8.3*  HCT 23.6*  --  20.3*  --   --   --   --  25.4*  PLT 252  --  207  --   --   --   --  242  APTT  --  23*  --   --   --   --   --   --   LABPROT  --  15.0  --   --  15.6*  --   --  14.8  INR  --  1.18  --   --  1.24  --   --  1.16  HEPARINUNFRC  --   --   --   < > 0.57 0.83* 0.83* 0.63  CREATININE 2.42*  --  2.29*  --   --   --   --  2.22*  TROPONINI <0.30  --   --   --   --   --   --   --   < > = values in this interval not displayed.  Estimated Creatinine Clearance: 41.7 ml/min (by C-G formula based on Cr of 2.22).  Assessment: 67yoM with T3 prostate cancer, s/p radical prostatectomy w/ bilateral pelvic lymphadenectomy 8/14, admitted 8/25 for nausea/emesis and AKI.  On warfarin 7.5mg  daily at home d/t Hx DVT in 2012; held prior to procedure 8/14 but never resumed d/t post-op anemia/blood loss. Given clearance to resume anticoagulation per urology on this admission; requested bridge to warfarin with heparin per cardiology.  Heparin begun 8/25 with 2500 units bolus, infusion at 1650 units/hr. Subsequent Heparin levels: 0.83 units/ml -> rate decrease to 1450 units/hr, again 0.83-> rate decrease to 1150 units/hr  Heparin level this am = 0.63, within goal range  Warfarin doses from 8/25: 7.5mg , 7.5mg   INR today = 1.16  One unit PRBC 8/26:  CBC today low, stable 8.3/25.4, Plt 242  No signs of  bleed per patient this am  Goal of Therapy:  INR 2-3 Heparin level 0.3-0.7 units/ml Monitor platelets by anticoagulation protocol: Yes   Plan:   Continue heparin drip at 1150 units/hr  Check Heparin level daily while on heparin  Daily CBC while on heparin; follow CBC closely  Warfarin 10mg  today at 1200  Minda Ditto PharmD Pager [phone] 01/12/2014, 8:16 AM   .

## 2014-01-12 NOTE — Progress Notes (Signed)
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 Foley removed 5/25.   2- Acute Renal Failure, Mild Left Hydronephrosis - Baseline Cr <1.5. Had some rise in hospital post-op with JP Cr same as serum, felt to likley represent pre-renal etiology as on recent ARB and 1.6 prior to DC 8/17. ER labs this admission with Cr 2.42 and some mild left hydro. He admits to very poor PO intake past 2 days prior to admission. Denies flank pain.   3 - Nausea, Malaise - Pt with 3 days of progressive nausea with emesis on admit. UCX positive with serratia and now resolved with ABX therapy.   Today [name] is stable. Continues to tollerate reg diet. No flank pain or hematuria. Stress incontinence as expected.   Objective: Vital signs in last 24 hours: Temp:  [97.1 F (36.2 C)-97.9 F (36.6 C)] 97.8 F (36.6 C) (08/27 1400) Pulse Rate:  [85-91] 86 (08/27 1400) Resp:  [20] 20 (08/27 1400) BP: (130-134)/(60-66) 130/60 mmHg (08/27 1400) SpO2:  [100 %] 100 % (08/27 1400) Weight:  [111.086 kg (244 lb 14.4 oz)] 111.086 kg (244 lb 14.4 oz) (08/27 0545) Last BM Date: 01/12/14  Intake/Output from previous day: 08/26 0701 - 08/27 0700 In: 2853.8 [P.O.:240; I.V.:2228.8; Blood:335; IV Piggyback:50] Out: 350 [Urine:350] Intake/Output this shift: Total I/O In: 610.3 [I.V.:610.3] Out: -   General appearance: alert, cooperative, appears stated age and wife also at bedside Eyes: negative Nose: Nares normal. Septum midline. Mucosa normal. No drainage or sinus tenderness. Throat: lips, mucosa, and tongue normal; teeth and gums normal Neck: supple, symmetrical, trachea midline Back: symmetric, no curvature. ROM normal. No CVA tenderness. Resp: non-labored on room air. Cardio: Nl rate. GI: soft, non-tender; bowel sounds normal; no masses,  no organomegaly Male genitalia: normal Extremities: extremities normal,  atraumatic, no cyanosis or edema Pulses: 2+ and symmetric Skin: Skin color, texture, turgor normal. No rashes or lesions Lymph nodes: Cervical, supraclavicular, and axillary nodes normal. Neurologic: Grossly normal Incision/Wound: Recent incision sites c/d/i. NO drainage or hernias.   Lab Results:   Recent Labs  01/11/14 0419 01/12/14 0632  WBC 7.6 8.0  HGB 6.8* 8.3*  HCT 20.3* 25.4*  PLT 207 242   BMET  Recent Labs  01/11/14 0419 01/12/14 0632  NA 136* 134*  K 4.0 4.1  CL 102 101  CO2 20 19  GLUCOSE 128* 113*  BUN 52* 43*  CREATININE 2.29* 2.22*  CALCIUM 8.7 8.9   PT/INR  Recent Labs  01/11/14 0820 01/12/14 0632  LABPROT 15.6* 14.8  INR 1.24 1.16   ABG No results found for this basename: PHART, PCO2, PO2, HCO3,  in the last 72 hours  Studies/Results: No results found.  Anti-infectives: Anti-infectives   Start     Dose/Rate Route Frequency Ordered Stop   01/11/14 1600  cefTRIAXone (ROCEPHIN) 1 g in dextrose 5 % 50 mL IVPB     1 g 100 mL/hr over 30 Minutes Intravenous Every 24 hours 01/10/14 1525     01/10/14 1500  cefTRIAXone (ROCEPHIN) 1 g in dextrose 5 % 50 mL IVPB     1 g 100 mL/hr over 30 Minutes Intravenous  Once 01/10/14 1457 01/10/14 1600      Assessment/Plan:  1 - High Risk Prostate Cancer - no further intervention this admission. Keep foley out. He is experiencing stress incontinence as expected at this timepoint.   2- Acute Renal Failure, Mild Left Hydronephrosis -  DDX pre renal and/or left ureteral edema / balloon obstruction. Agree with hydration and serial labs. Will RX renal US to verify no progressive hydro, as long as only mild and w/o flank pain, favor no drastic intervention as he is being bridged to anticoagulated status as well.   3 - Nausea, Malaise -essentially resolved and likely due to early pyelo. Fortunately CX's pan-sensitive, should be able to continue non-nephrotoxic ABX as outpatient to complete course.  Greatly  appreciate hospitalist input and care.  Will follow.  [facility], Ceaser Ebeling 01/12/2014

## 2014-01-12 NOTE — Progress Notes (Signed)
OT Cancellation Note  Patient Details Name: [name] MRN: [id number] DOB: February 14, 1946   Cancelled Treatment:    Reason Eval/Treat Not Completed: OT screened, no needs identified, will sign off.  Pt is up in room and feels independent with ADLs, bathroom transfers.  Will sign off.   Kinan Safley 01/12/2014, 3:54 PM Lesle Chris, OTR/L [phone] 01/12/2014

## 2014-01-12 NOTE — Evaluation (Signed)
Physical Therapy Evaluation Patient Details Name: [name] MRN: [id number] DOB: Oct 14, 1945 Today's Date: 01/12/2014   History of Present Illness  68 yo male admitted with pyelonephritis, low hemoglobin. s/p radical prostatectomy 12/30/13. Hx of chronic R LE DVT, HTN  Clinical Impression  On eval, pt was Min guard assist for mobility-able to ambulate ~450 feet without an assistive device. Climbed 4 steps with use of one rail. Pt actually reported that he had already walked in hallway earlier. Do not anticipate any PT needs at this time. Therapist does recommend daily ambulation, 2-3x/day (if possible) with nursing supervision. 1x eval. Wil sign off.     Follow Up Recommendations No PT follow up;Supervision for mobility/OOB    Equipment Recommendations  None recommended by PT    Recommendations for Other Services       Precautions / Restrictions Precautions Precautions: Fall Precaution Comments: wears flip flops Restrictions Weight Bearing Restrictions: No      Mobility  Bed Mobility                  Transfers Overall transfer level: Modified independent                  Ambulation/Gait Ambulation/Gait assistance: Min guard Ambulation Distance (Feet): 450 Feet Assistive device: None Gait Pattern/deviations: Step-through pattern;Decreased stride length     General Gait Details: 1 misstep-likely due to flip flop shoe. Otherwise, no LOB. Tolerated activity well.   Stairs Stairs: Yes Stairs assistance: Min guard Stair Management: One rail Right;Step to pattern;Alternating pattern;Forwards Number of Stairs: 4 General stair comments: only able to practice 4 steps due to IV line. However, pt perfomed well. VCs safety.   Wheelchair Mobility    Modified Rankin (Stroke Patients Only)       Balance                                             Pertinent Vitals/Pain Pain Assessment: No/denies pain    Home Living Family/patient  expects to be discharged to:: Private residence Living Arrangements: Spouse/significant other   Type of Home: House Home Access: Stairs to enter   [employer] of Steps: 12 through basement. Level entry through front Home Layout: Two level Home Equipment: Cane - single point      Prior Function Level of Independence: Independent               Hand Dominance        Extremity/Trunk Assessment   Upper Extremity Assessment: Defer to OT evaluation           Lower Extremity Assessment: Generalized weakness      Cervical / Trunk Assessment: Normal  Communication   Communication: No difficulties  Cognition Arousal/Alertness: Awake/alert Behavior During Therapy: WFL for tasks assessed/performed Overall Cognitive Status: Within Functional Limits for tasks assessed                      General Comments      Exercises        Assessment/Plan    PT Assessment Patent does not need any further PT services (recommend daily mobility/ambulation with nursing)  PT Diagnosis     PT Problem List    PT Treatment Interventions     PT Goals (Current goals can be found in the Care Plan section) Acute Rehab PT Goals Patient Stated Goal: home soon  PT Goal Formulation: No goals set, d/c therapy    Frequency     Barriers to discharge        Co-evaluation               End of Session   Activity Tolerance: Patient tolerated treatment well Patient left: in chair;with call bell/phone within reach           Time: 3845-3646 PT Time Calculation (min): 10 min   Charges:   PT Evaluation $Initial PT Evaluation Tier I: 1 Procedure PT Treatments $Gait Training: 8-22 mins   PT G Codes:          Weston Anna, MPT Pager: [phone]

## 2014-01-12 NOTE — Clinical Documentation Improvement (Signed)
Presents with Pyelonephritis, ARF, Anemia - postop anemia documented.   Patient's H&H has dropped from 8.0 / 23.6 to 6.8 / 20.3 since admission  Blood transfusion given  Post transfusion H&H = 8.3 / 25.4  Please further specify the type of anemia you feel the patient has and document findings in next progress note and discharge summary.  Acute Blood Loss Anemia Acute Post Op Blood Loss Anemia - type of anemia needs to be documented Acute on chronic blood loss anemia Other Condition  Thank You, Zoila Shutter ,RN Clinical Documentation Specialist:  [place] [employer]

## 2014-01-13 LAB — COMPREHENSIVE METABOLIC PANEL
ALT: 37 U/L (ref 0–53)
AST: 25 U/L (ref 0–37)
Albumin: 2.7 g/dL — ABNORMAL LOW (ref 3.5–5.2)
Alkaline Phosphatase: 70 U/L (ref 39–117)
Anion gap: 13 (ref 5–15)
BUN: 38 mg/dL — ABNORMAL HIGH (ref 6–23)
CO2: 20 meq/L (ref 19–32)
Calcium: 8.8 mg/dL (ref 8.4–10.5)
Chloride: 104 mEq/L (ref 96–112)
Creatinine, Ser: 2.07 mg/dL — ABNORMAL HIGH (ref 0.50–1.35)
GFR, EST AFRICAN AMERICAN: 36 mL/min — AB (ref >90)
GFR, EST NON AFRICAN AMERICAN: 31 mL/min — AB (ref >90)
Glucose, Bld: 121 mg/dL — ABNORMAL HIGH (ref 70–99)
POTASSIUM: 4.2 meq/L (ref 3.7–5.3)
Sodium: 137 mEq/L (ref 137–147)
Total Bilirubin: 0.2 mg/dL — ABNORMAL LOW (ref 0.3–1.2)
Total Protein: 6.3 g/dL (ref 6.0–8.3)

## 2014-01-13 LAB — CBC
HEMATOCRIT: 24 % — AB (ref 39.0–52.0)
HEMOGLOBIN: 7.9 g/dL — AB (ref 13.0–17.0)
MCH: 28.4 pg (ref 26.0–34.0)
MCHC: 32.9 g/dL (ref 30.0–36.0)
MCV: 86.3 fL (ref 78.0–100.0)
Platelets: 238 10*3/uL (ref 150–400)
RBC: 2.78 MIL/uL — ABNORMAL LOW (ref 4.22–5.81)
RDW: 14.7 % (ref 11.5–15.5)
WBC: 8.1 10*3/uL (ref 4.0–10.5)

## 2014-01-13 LAB — HEPARIN LEVEL (UNFRACTIONATED): Heparin Unfractionated: 0.5 IU/mL (ref 0.30–0.70)

## 2014-01-13 LAB — PROTIME-INR
INR: 1.24 (ref 0.00–1.49)
Prothrombin Time: 15.6 seconds — ABNORMAL HIGH (ref 11.6–15.2)

## 2014-01-13 MED ORDER — WARFARIN SODIUM 10 MG PO TABS
10.0000 mg | ORAL_TABLET | Freq: Once | ORAL | Status: AC
  Administered 2014-01-13: 10 mg via ORAL
  Filled 2014-01-13: qty 1

## 2014-01-13 MED ORDER — FUROSEMIDE 10 MG/ML IJ SOLN
40.0000 mg | Freq: Once | INTRAMUSCULAR | Status: AC
  Administered 2014-01-13: 40 mg via INTRAVENOUS
  Filled 2014-01-13 (×2): qty 4

## 2014-01-13 MED ORDER — ALBUMIN HUMAN 25 % IV SOLN
50.0000 g | Freq: Once | INTRAVENOUS | Status: AC
  Administered 2014-01-13: 50 g via INTRAVENOUS
  Filled 2014-01-13: qty 200

## 2014-01-13 NOTE — Care Management Note (Signed)
CARE MANAGEMENT NOTE 01/13/2014  Patient:  [name],[name]   Account Number:  [id number]  Date Initiated:  01/13/2014  Documentation initiated by:  Leafy Kindle  Subjective/Objective Assessment:   68 yo admitted wit pyelonephritis     Action/Plan:   from home with wife   Anticipated DC Date:  01/16/2014   Anticipated DC Plan:  [place]  CM consult      Choice offered to / List presented to:             Status of service:  In process, will continue to follow Medicare Important Message given?   (If response is "NO", the following Medicare IM given date fields will be blank) Date Medicare IM given:   Medicare IM given by:   Date Additional Medicare IM given:   Additional Medicare IM given by:    Discharge Disposition:    Per UR Regulation:  Reviewed for med. necessity/level of care/duration of stay  If discussed at [place] of Stay Meetings, dates discussed:    Comments:  01/13/14 Marney Doctor RN,[name],NCM Pt with hgb of 7.9, to receive albumin today.  Still on IV heparin.  CM will continue to follow for DC needs.

## 2014-01-13 NOTE — Progress Notes (Signed)
Subjective:  Denies chest pain or shortness of breath still marked swelling over his legs renal function improving slowly. Noted to have hypoalbuminemia. INR remained subtherapeutic  Objective:  Vital Signs in the last 24 hours: Temp:  [97.8 F (36.6 C)-98.3 F (36.8 C)] 98.3 F (36.8 C) (08/28 0509) Pulse Rate:  [78-86] 78 (08/28 0509) Resp:  [18-20] 18 (08/28 0509) BP: (125-130)/(54-68) 126/68 mmHg (08/28 0509) SpO2:  [100 %] 100 % (08/28 0509)  Intake/Output from previous day: 08/27 0701 - 08/28 0700 In: 710.3 [P.O.:100; I.V.:610.3] Out: -  Intake/Output from this shift:    Physical Exam: Neck: no adenopathy, no carotid bruit, no JVD and supple, symmetrical, trachea midline Lungs: clear to auscultation bilaterally Heart: regular rate and rhythm, S1, S2 normal and Soft systolic murmur noted Abdomen: soft, non-tender; bowel sounds normal; no masses,  no organomegaly Extremities: No clubbing cyanosis 2+ edema right more than left noted  Lab Results:  Recent Labs  01/12/14 0632 01/13/14 0407  WBC 8.0 8.1  HGB 8.3* 7.9*  PLT 242 238    Recent Labs  01/12/14 0632 01/13/14 0407  NA 134* 137  K 4.1 4.2  CL 101 104  CO2 19 20  GLUCOSE 113* 121*  BUN 43* 38*  CREATININE 2.22* 2.07*    Recent Labs  01/10/14 0916  TROPONINI <0.30   Hepatic Function Panel  Recent Labs  01/13/14 0407  PROT 6.3  ALBUMIN 2.7*  AST 25  ALT 37  ALKPHOS 70  BILITOT 0.2*   No results found for this basename: CHOL,  in the last 72 hours No results found for this basename: PROTIME,  in the last 72 hours  Imaging: Imaging results have been reviewed and US Renal  01/12/2014   CLINICAL DATA:  Recent hydronephrosis on left  EXAM: RENAL ULTRASOUND  COMPARISON:  January 10, 2014 CT abdomen and pelvis  FINDINGS: Right Kidney:  Length: 11.9 cm. Echogenicity and renal cortical thickness are within normal limits. No perinephric fluid or hydronephrosis visualized. There is a simple cyst in  the lower pole right kidney region measuring 1.7 x 1.5 x 1.4 cm. No other renal mass seen. There is no sonographically demonstrable calculus or ureterectasis.  Left Kidney:  Length: 11.1 cm. Echogenicity and renal cortical thickness are within normal limits. No mass, perinephric fluid, or hydronephrosis visualized. No sonographically demonstrable calculus or ureterectasis.  The urinary bladder is empty and cannot be assessed.  IMPRESSION: There has been resolution of previously noted left-sided hydronephrosis. There is a small renal cyst on the right. Study otherwise within normal limits.   Electronically Signed   By: Lowella Grip M.D.   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

## 2014-01-13 NOTE — Progress Notes (Signed)
Pt's urine is bloody, Dr Terrence Dupont called-ordered to stopped the heparin and hold the coumadin until urine has no blood in it. Give it at 1800 if no blood in urine. Call Dr. Tresa Moore if urine continues to be bloody. There was only the one time bloody urine. Urine has been light yellow the rest of the afternoon.Wife at bedside. Ambulating to the bathroom and tolerating well.

## 2014-01-13 NOTE — Progress Notes (Signed)
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 Foley removed 5/25.   2- Acute Renal Failure, Mild Left Hydronephrosis - Baseline Cr <1.5. Had some rise in hospital post-op with JP Cr same as serum, felt to likley represent pre-renal etiology as on recent ARB and 1.6 prior to DC 8/17. ER labs this admission with Cr 2.42 and some mild left hydro. He admits to very poor PO intake past 2 days prior to admission. Denies flank pain. F/U US 8/27 with complete resolution of hydro and Cr slowly trending down.   3 - Nausea, Malaise - Pt with 3 days of progressive nausea with emesis on admit. UCX positive with serratia and now resolved with ABX therapy.   Today Noel is without complaints. No pain, ambulating.   Objective: Vital signs in last 24 hours: Temp:  [97.8 F (36.6 C)-98.3 F (36.8 C)] 98.3 F (36.8 C) (08/28 0509) Pulse Rate:  [78-86] 78 (08/28 0509) Resp:  [18-20] 18 (08/28 0509) BP: (125-130)/(54-68) 126/68 mmHg (08/28 0509) SpO2:  [100 %] 100 % (08/28 0509) Last BM Date: 01/12/14  Intake/Output from previous day: 08/27 0701 - 08/28 0700 In: 710.3 [P.O.:100; I.V.:610.3] Out: -  Intake/Output this shift: Total I/O In: 100 [P.O.:100] Out: -   General appearance: alert, cooperative and appears stated age Head: Normocephalic, without obvious abnormality, atraumatic Throat: lips, mucosa, and tongue normal; teeth and gums normal Neck: supple, symmetrical, trachea midline Back: symmetric, no curvature. ROM normal. No CVA tenderness. Resp: non-labored on room air Chest wall: no tenderness Cardio: Nl rate GI: soft, non-tender; bowel sounds normal; no masses,  no organomegaly Male genitalia: normal, moderate stress incontinence as expected.  Extremities: stable R>L LE edema Pulses: 2+ and symmetric Skin: Skin color, texture, turgor normal. No rashes or lesions Neurologic:  Grossly normal Incision/Wound: Recent prior incision sites c/d/i.   Lab Results:   Recent Labs  01/12/14 0632 01/13/14 0407  WBC 8.0 8.1  HGB 8.3* 7.9*  HCT 25.4* 24.0*  PLT 242 238   BMET  Recent Labs  01/12/14 0632 01/13/14 0407  NA 134* 137  K 4.1 4.2  CL 101 104  CO2 19 20  GLUCOSE 113* 121*  BUN 43* 38*  CREATININE 2.22* 2.07*  CALCIUM 8.9 8.8   PT/INR  Recent Labs  01/12/14 0632 01/13/14 0407  LABPROT 14.8 15.6*  INR 1.16 1.24   ABG No results found for this basename: PHART, PCO2, PO2, HCO3,  in the last 72 hours  Studies/Results: US Renal  01/12/2014   CLINICAL DATA:  Recent hydronephrosis on left  EXAM: RENAL ULTRASOUND  COMPARISON:  January 10, 2014 CT abdomen and pelvis  FINDINGS: Right Kidney:  Length: 11.9 cm. Echogenicity and renal cortical thickness are within normal limits. No perinephric fluid or hydronephrosis visualized. There is a simple cyst in the lower pole right kidney region measuring 1.7 x 1.5 x 1.4 cm. No other renal mass seen. There is no sonographically demonstrable calculus or ureterectasis.  Left Kidney:  Length: 11.1 cm. Echogenicity and renal cortical thickness are within normal limits. No mass, perinephric fluid, or hydronephrosis visualized. No sonographically demonstrable calculus or ureterectasis.  The urinary bladder is empty and cannot be assessed.  IMPRESSION: There has been resolution of previously noted left-sided hydronephrosis. There is a small renal cyst on the right. Study otherwise within normal limits.   Electronically Signed   By: Lowella Grip M.D.   On: 01/12/2014  21:52    Anti-infectives: Anti-infectives   Start     Dose/Rate Route Frequency Ordered Stop   01/11/14 1600  cefTRIAXone (ROCEPHIN) 1 g in dextrose 5 % 50 mL IVPB     1 g 100 mL/hr over 30 Minutes Intravenous Every 24 hours 01/10/14 1525     01/10/14 1500  cefTRIAXone (ROCEPHIN) 1 g in dextrose 5 % 50 mL IVPB     1 g 100 mL/hr over 30 Minutes  Intravenous  Once 01/10/14 1457 01/10/14 1600      Assessment/Plan:  1 - High Risk Prostate Cancer - no further intervention this admission. Keep foley out. He is experiencing stress incontinence as expected at this timepoint.   2- Acute Renal Failure, Mild Left Hydronephrosis - DDX pre renal and/or left ureteral edema / balloon obstruction. Radiographic hydro now resolved. Observe.   3 - Nausea, Malaise -essentially resolved and likely due to early pyelo. Fortunately CX's pan-sensitive, should be able to continue non-nephrotoxic ABX as outpatient to complete course.  Greatly appreciate hospitalist input and care. OK for DC from GU perspective at any point. Has Urol f/u arranged.   Call with questions.  [facility], Shakiya Mcneary 01/13/2014

## 2014-01-13 NOTE — Progress Notes (Signed)
ANTICOAGULATION CONSULT NOTE - Follow up  Pharmacy Consult for Heparin/Warfarin Indication: chronic Warfarin for hx DVT  No Known Allergies  Patient Measurements: Height: 6\' 2"  (188 cm) Weight: 244 lb 14.4 oz (111.086 kg) IBW/kg (Calculated) : 82.2 Heparin Dosing Weight: 105 kg  Vital Signs: Temp: 98.3 F (36.8 C) (08/28 0509) Temp src: Oral (08/28 0509) BP: 126/68 mmHg (08/28 0509) Pulse Rate: 78 (08/28 0509)  Labs:  Recent Labs  01/10/14 0916  01/10/14 2340 01/11/14 0419 01/11/14 0820  01/11/14 2140 01/12/14 0632 01/13/14 0407  HGB 8.0*  --   --  6.8*  --   --   --  8.3* 7.9*  HCT 23.6*  --   --  20.3*  --   --   --  25.4* 24.0*  PLT 252  --   --  207  --   --   --  242 238  APTT  --   --  23*  --   --   --   --   --   --   LABPROT  --   < > 15.0  --  15.6*  --   --  14.8 15.6*  INR  --   < > 1.18  --  1.24  --   --  1.16 1.24  HEPARINUNFRC  --   --   --   --  0.57  < > 0.83* 0.63 0.50  CREATININE 2.42*  --   --  2.29*  --   --   --  2.22* 2.07*  TROPONINI <0.30  --   --   --   --   --   --   --   --   < > = values in this interval not displayed.  Estimated Creatinine Clearance: 44.7 ml/min (by C-G formula based on Cr of 2.07).  Assessment: 67yoM with T3 prostate cancer, s/p radical prostatectomy w/ bilateral pelvic lymphadenectomy 8/14, admitted 8/25 for nausea/emesis and AKI.  On warfarin 7.5mg  daily at home d/t Hx DVT in 2012; held prior to procedure 8/14 but never resumed d/t post-op anemia/blood loss. Given clearance to resume anticoagulation per urology on this admission; requested bridge to warfarin with heparin per cardiology.  Heparin begun 8/25 with 2500 units bolus, infusion at 1650 units/hr. Subsequent Heparin levels: 0.83 units/ml -> rate decrease to 1450 units/hr, again 0.83-> rate decrease to 1150 units/hr  Heparin level this am = 0.50, within goal range on 1150 units/hr  Warfarin doses 8/25-27: 7.5mg , 7.5mg , 10mg   INR today = 1.24  One unit  PRBC 8/26:  Hgb/Hct today low, decreased again 7.9/24.0, Plt 280 (stable)  FOBT (-) on 8/26  No signs of bleed per patient this am  SCr improved to 2.07, CrCl ~45 ml/min  Goal of Therapy:  INR 2-3 Heparin level 0.3-0.7 units/ml Monitor platelets by anticoagulation protocol: Yes   Plan:   Continue heparin drip at 1150 units/hr  Check Heparin level daily while on heparin  Daily CBC while on heparin; follow CBC closely  Warfarin 10mg  today at 1200  Thank you for the consult.  Currie Paris, PharmD, BCPS Pager: [phone] Pharmacy: [phone] 01/13/2014 8:11 AM

## 2014-01-14 LAB — CBC
HCT: 25.5 % — ABNORMAL LOW (ref 39.0–52.0)
Hemoglobin: 8.2 g/dL — ABNORMAL LOW (ref 13.0–17.0)
MCH: 28.2 pg (ref 26.0–34.0)
MCHC: 32.2 g/dL (ref 30.0–36.0)
MCV: 87.6 fL (ref 78.0–100.0)
PLATELETS: 235 10*3/uL (ref 150–400)
RBC: 2.91 MIL/uL — ABNORMAL LOW (ref 4.22–5.81)
RDW: 14.7 % (ref 11.5–15.5)
WBC: 6.8 10*3/uL (ref 4.0–10.5)

## 2014-01-14 LAB — COMPREHENSIVE METABOLIC PANEL
ALT: 35 U/L (ref 0–53)
AST: 29 U/L (ref 0–37)
Albumin: 3.3 g/dL — ABNORMAL LOW (ref 3.5–5.2)
Alkaline Phosphatase: 64 U/L (ref 39–117)
Anion gap: 14 (ref 5–15)
BUN: 38 mg/dL — ABNORMAL HIGH (ref 6–23)
CALCIUM: 9.5 mg/dL (ref 8.4–10.5)
CO2: 22 mEq/L (ref 19–32)
Chloride: 105 mEq/L (ref 96–112)
Creatinine, Ser: 2.14 mg/dL — ABNORMAL HIGH (ref 0.50–1.35)
GFR calc non Af Amer: 30 mL/min — ABNORMAL LOW (ref >90)
GFR, EST AFRICAN AMERICAN: 35 mL/min — AB (ref >90)
Glucose, Bld: 107 mg/dL — ABNORMAL HIGH (ref 70–99)
Potassium: 4.3 mEq/L (ref 3.7–5.3)
SODIUM: 141 meq/L (ref 137–147)
TOTAL PROTEIN: 6.8 g/dL (ref 6.0–8.3)
Total Bilirubin: 0.2 mg/dL — ABNORMAL LOW (ref 0.3–1.2)

## 2014-01-14 LAB — HEPARIN LEVEL (UNFRACTIONATED)

## 2014-01-14 LAB — PROTIME-INR
INR: 1.18 (ref 0.00–1.49)
PROTHROMBIN TIME: 15 s (ref 11.6–15.2)

## 2014-01-14 MED ORDER — WARFARIN SODIUM 7.5 MG PO TABS
15.0000 mg | ORAL_TABLET | Freq: Once | ORAL | Status: AC
  Administered 2014-01-14: 15 mg via ORAL
  Filled 2014-01-14: qty 2

## 2014-01-14 MED ORDER — FERROUS SULFATE 325 (65 FE) MG PO TABS: 325.0000 mg | ORAL_TABLET | Freq: Three times a day (TID) | ORAL | Status: DC

## 2014-01-14 MED ORDER — CIPROFLOXACIN HCL 250 MG PO TABS: 250.0000 mg | ORAL_TABLET | Freq: Two times a day (BID) | ORAL | Status: DC

## 2014-01-14 NOTE — Discharge Summary (Signed)
  Discharge summary dictated on 01/14/2014 dictation number is [phone]

## 2014-01-14 NOTE — Discharge Summary (Signed)
NAME:  [name], [name]              ACCOUNT NO.:  [id number]  MEDICAL RECORD NO.:  [id number]  LOCATION:  7                         FACILITY:  [facility]  PHYSICIAN:  Tyronne Blann N. Terrence Dupont, M.D. DATE OF BIRTH:  1945-07-09  DATE OF ADMISSION:  01/10/2014 DATE OF DISCHARGE:  01/14/2014                              DISCHARGE SUMMARY   ADMITTING DIAGNOSES: 1. Possible left pyelonephritis with mild left hydronephrosis status     post recent robotic radical prostatectomy with bilateral pelvic     lymphadenectomy. 2. Hypertension. 3. Glucose intolerance. 4. Acute renal failure probably secondary to prerenal     azotemia/angiotensin II receptor blockers use/hypotension. 5. Postop acute blood loss anemia. 6. Chronic deep venous thrombosis, right leg. 7. History of varicose vein stripping in the past.  FINAL DIAGNOSES: 1. Status post left pyelonephritis with mild left hydronephrosis,     resolved status post recent robotic radical prostatectomy with     bilateral pelvic lymphadenectomy. 2. Resolving Serratia marcescens. 3. Urinary tract infection. 4. Hypertension. 5. Glucose intolerance. 6. Acute on chronic renal insufficiency, stage III, improved. 7. Acute blood loss, postop anemia, stable. 8. Chronic deep venous thrombosis, right leg. 9. Status post hematuria, resolved. 10.History of varicose vein stripping right leg in the past.  MEDICATIONS:  Discharge home medications are: 1. Ciprofloxacin 250 mg 1 tablet twice daily. 2. Ferrous sulfate 325 mg 3 times daily. 3. Senokot 1-2 tablets by mouth as before for constipation. 4. Warfarin 7.5 mg 1 tablet daily. 5. The patient has been advised to stop losartan, Keflex, and     oxycodone.  DIET:  Low-salt, low-cholesterol, 1800 calories ADA diet.  The patient has been advised to monitor blood pressure and blood sugar daily.  The patient also advised to hold off 2 blood pressure medications for now as his blood pressure remains in normal  range.  CONDITION ON DISCHARGE:  Stable.  FOLLOWUP:  With Dr. Doylene Canard, early next week.  BRIEF HISTORY AND HOSPITAL COURSE:  Mr. [name] is 68 year old male with past medical history significant for hypertension, history of adenocarcinoma of prostate status post robotic radical prostatectomy with bilateral pelvic lymphadenectomy on December 30, 2013, acute blood loss postop anemia, history of varicose vein stripping in the past, history of chronic DVT of the right leg on chronic Coumadin therapy, chronic kidney disease.  He came to the ER complaining of vague abdominal bloating associated with nausea, vomiting, and diarrhea for last 3 days associated with poor appetite.  The patient had CT of the abdomen done in the ED which showed mild left hydronephrosis with questionable evidence of acute pyelonephritis and was also noted to have elevated creatinine to 2.42.  Patient had indwelling Foley catheter since surgery which has been removed earlier today.  Urine cultures and blood cultures have been drawn.  Patient was started on IV Rocephin. Patient denies any dysuria, denies any fever, but complains of chills. Denies any bright red blood per rectum or in the urine.  PAST MEDICAL HISTORY:  As above.  PHYSICAL EXAMINATION:  GENERAL:  He was alert, awake, oriented x3. VITAL SIGNS:  Blood pressure was 131/70, pulse 93.  He was afebrile. EYES:  Conjunctiva was pink. NECK:  Supple.  No JVD.  No bruit. LUNGS:  Clear to auscultation without rhonchi or rales. CARDIOVASCULAR:  S1, S2 was normal.  There was no murmur or gallop. ABDOMEN:  Soft.  Bowel sounds were present.  Surgical incisions were healing well.  There was no erythema or discharge. EXTREMITIES:  There was chronic swelling of the right leg more than the left leg with dermatosis. NEUROLOGIC:  Grossly intact.  LABORATORY DATA:  His labs, sodium was 134, potassium 4.3, BUN 57, creatinine 2.42, glucose was 131, hemoglobin was 8,  hematocrit 23.6, white count of 7.9.  Repeat hemoglobin was 6.8, hematocrit 20.3, white count of 7.6 with left shift.  His PT was 15, INR 1.18.  His last labs today, sodium is 141, potassium 4.3, BUN 38, creatinine 2.14, which is trending down.  Glucose is 107.  Hemoglobin has been stable 8.2, hematocrit 25.5, white count of 6.8.  Blood cultures have been negative. Urine culture was positive for Serratia marcescens which was sensitive to ciprofloxacin and resistant to cefazolin.  BRIEF HOSPITAL COURSE:  The patient was admitted to regular floor and pan cultures were obtained and was started on IV Rocephin with improvement in his symptoms.  The patient also received 1 unit of packed RBCs during the hospital stay.  The patient was started on IV heparin and Coumadin.  Patient remained afebrile during the hospital stay. Repeat ultrasound of the abdomen showed resolution of left hydronephrosis.  The patient had episode of hematuria yesterday requiring discontinuation of heparin.  The patient is eager to go home. His hemoglobin has been stable.  There is no further evidence of hematuria or bleeding.  His hemoglobin has been stable.  His renal function is slowly improving.  The patient will be discharged home on above medications and will be followed up by Dr. Doylene Canard, early next week.  Discussed with patient regarding restarting the heparin and keeping him in the hospital until his INR is in therapeutic range, but is eager to go home.  The patient will follow up with Dr. Tresa Moore as scheduled and see Dr. Doylene Canard, early next week.     Allegra Lai. Terrence Dupont, M.D.     MNH/MEDQ  D:  01/14/2014  T:  01/14/2014  Job:  [id number]  cc:   [name], M.D. Fax: [phone]

## 2014-01-14 NOTE — Progress Notes (Signed)
Patient discharged home, all discharge medications and instructions reviewed and questions answered. Patient to be assisted to vehicle by wheelchair.  

## 2014-01-14 NOTE — Discharge Instructions (Signed)
Pyelonephritis, Adult °Pyelonephritis is a kidney infection. In general, there are 2 main types of pyelonephritis: °· Infections that come on quickly without any warning (acute pyelonephritis). °· Infections that persist for a long period of time (chronic pyelonephritis). °CAUSES  °Two main causes of pyelonephritis are: °· Bacteria traveling from the bladder to the kidney. This is a problem especially in pregnant women. The urine in the bladder can become filled with bacteria from multiple causes, including: °¨ Inflammation of the prostate gland (prostatitis). °¨ Sexual intercourse in females. °¨ Bladder infection (cystitis). °· Bacteria traveling from the bloodstream to the tissue part of the kidney. °Problems that may increase your risk of getting a kidney infection include: °· Diabetes. °· Kidney stones or bladder stones. °· Cancer. °· Catheters placed in the bladder. °· Other abnormalities of the kidney or ureter. °SYMPTOMS  °· Abdominal pain. °· Pain in the side or flank area. °· Fever. °· Chills. °· Upset stomach. °· Blood in the urine (dark urine). °· Frequent urination. °· Strong or persistent urge to urinate. °· Burning or stinging when urinating. °DIAGNOSIS  °Your caregiver may diagnose your kidney infection based on your symptoms. A urine sample may also be taken. °TREATMENT  °In general, treatment depends on how severe the infection is.  °· If the infection is mild and caught early, your caregiver may treat you with oral antibiotics and send you home. °· If the infection is more severe, the bacteria may have gotten into the bloodstream. This will require intravenous (IV) antibiotics and a hospital stay. Symptoms may include: °¨ High fever. °¨ Severe flank pain. °¨ Shaking chills. °· Even after a hospital stay, your caregiver may require you to be on oral antibiotics for a period of time. °· Other treatments may be required depending upon the cause of the infection. °HOME CARE INSTRUCTIONS  °· Take your  antibiotics as directed. Finish them even if you start to feel better. °· Make an appointment to have your urine checked to make sure the infection is gone. °· Drink enough fluids to keep your urine clear or pale yellow. °· Take medicines for the bladder if you have urgency and frequency of urination as directed by your caregiver. °SEEK IMMEDIATE MEDICAL CARE IF:  °· You have a fever or persistent symptoms for more than 2-3 days. °· You have a fever and your symptoms suddenly get worse. °· You are unable to take your antibiotics or fluids. °· You develop shaking chills. °· You experience extreme weakness or fainting. °· There is no improvement after 2 days of treatment. °MAKE SURE YOU: °· Understand these instructions. °· Will watch your condition. °· Will get help right away if you are not doing well or get worse. °Document Released: 05/05/2005 Document Revised: 11/04/2011 Document Reviewed: 10/09/2010 °ExitCare® Patient Information ©2015 [employer]. This information is not intended to replace advice given to you by your health care provider. Make sure you discuss any questions you have with your health care provider. ° °

## 2014-01-14 NOTE — Progress Notes (Signed)
Subjective: Patient reports feeling well with no specific complaints this morning.  Objective: Vital signs in last 24 hours: Temp:  [98.2 F (36.8 C)] 98.2 F (36.8 C) (08/29 0503) Pulse Rate:  [76-90] 78 (08/29 0503) Resp:  [16-18] 18 (08/29 0503) BP: (128-144)/(52-62) 144/52 mmHg (08/29 0503) SpO2:  [100 %] 100 % (08/29 0503)  Intake/Output from previous day: 08/28 0701 - 08/29 0700 In: 2473.7 [P.O.:1160; I.V.:1013.7; IV Piggyback:300] Out: 1680 [Urine:1680] Intake/Output this shift: Total I/O In: 240 [P.O.:240] Out: 300 [Urine:300] Lab Results:  Recent Labs  01/12/14 0632 01/13/14 0407 01/14/14 0443  HGB 8.3* 7.9* 8.2*  HCT 25.4* 24.0* 25.5*   BMET  Recent Labs  01/13/14 0407 01/14/14 0443  NA 137 141  K 4.2 4.3  CL 104 105  CO2 20 22  GLUCOSE 121* 107*  BUN 38* 38*  CREATININE 2.07* 2.14*  CALCIUM 8.8 9.5    Recent Labs  01/12/14 0632 01/13/14 0407 01/14/14 0443  INR 1.16 1.24 1.18   No results found for this basename: LABURIN,  in the last 72 hours Results for orders placed during the hospital encounter of 01/10/14  URINE CULTURE     Status: None   Collection Time    01/10/14 10:09 AM      Result Value Ref Range Status   Specimen Description URINE, CLEAN CATCH   Final   Special Requests NONE   Final   Culture  Setup Time     Final   Value: 01/10/2014 12:55     Performed at [place]     Final   Value: >=100,000 COLONIES/ML     Performed at [facility]   Culture     Final   Value: SERRATIA MARCESCENS     Performed at [facility]   Report Status 01/12/2014 FINAL   Final   Organism ID, Bacteria SERRATIA MARCESCENS   Final  CULTURE, BLOOD (ROUTINE X 2)     Status: None   Collection Time    01/10/14  3:12 PM      Result Value Ref Range Status   Specimen Description BLOOD RIGHT HAND   Final   Special Requests BOTTLES DRAWN AEROBIC AND ANAEROBIC 3 ML   Final   Culture  Setup Time     Final   Value: 01/10/2014 18:55     Performed at [facility]   Culture     Final   Value:        BLOOD CULTURE RECEIVED NO GROWTH TO DATE CULTURE WILL BE HELD FOR 5 DAYS BEFORE ISSUING A FINAL NEGATIVE REPORT     Performed at [facility]   Report Status PENDING   Incomplete  CULTURE, BLOOD (ROUTINE X 2)     Status: None   Collection Time    01/10/14  3:13 PM      Result Value Ref Range Status   Specimen Description BLOOD RIGHT ANTECUBITAL   Final   Special Requests BOTTLES DRAWN AEROBIC ONLY 4 ML   Final   Culture  Setup Time     Final   Value: 01/10/2014 18:56     Performed at [facility]   Culture     Final   Value:        BLOOD CULTURE RECEIVED NO GROWTH TO DATE CULTURE WILL BE HELD FOR 5 DAYS BEFORE ISSUING A FINAL NEGATIVE REPORT     Performed at [facility]   Report Status  PENDING   Incomplete    Studies/Results: US Renal  01/12/2014   CLINICAL DATA:  Recent hydronephrosis on left  EXAM: RENAL ULTRASOUND  COMPARISON:  January 10, 2014 CT abdomen and pelvis  FINDINGS: Right Kidney:  Length: 11.9 cm. Echogenicity and renal cortical thickness are within normal limits. No perinephric fluid or hydronephrosis visualized. There is a simple cyst in the lower pole right kidney region measuring 1.7 x 1.5 x 1.4 cm. No other renal mass seen. There is no sonographically demonstrable calculus or ureterectasis.  Left Kidney:  Length: 11.1 cm. Echogenicity and renal cortical thickness are within normal limits. No mass, perinephric fluid, or hydronephrosis visualized. No sonographically demonstrable calculus or ureterectasis.  The urinary bladder is empty and cannot be assessed.  IMPRESSION: There has been resolution of previously noted left-sided hydronephrosis. There is a small renal cyst on the right. Study otherwise within normal limits.   Electronically Signed   By: Lowella Grip M.D.   On: 01/12/2014 21:52    Assessment/Plan: He seemed to be doing very well  overall. His white blood cell count has returned to normal and he has no further fever. He does have a low hemoglobin however that has remained stable. It would appear he is ready for discharge from a urologic standpoint with followup as previously arranged.  No new recommendations.   LOS: 4 days   Cezar Misiaszek C 01/14/2014, 6:58 AM

## 2014-01-14 NOTE — Progress Notes (Signed)
ANTICOAGULATION CONSULT NOTE - Follow up  Pharmacy Consult for Heparin/Warfarin Indication: chronic Warfarin for hx DVT  No Known Allergies  Patient Measurements: Height: 6\' 2"  (188 cm) Weight: 244 lb 14.4 oz (111.086 kg) IBW/kg (Calculated) : 82.2 Heparin Dosing Weight: 105 kg  Vital Signs: Temp: 98.2 F (36.8 C) (08/29 0503) Temp src: Oral (08/29 0503) BP: 144/52 mmHg (08/29 0503) Pulse Rate: 78 (08/29 0503)  Labs:  Recent Labs  01/12/14 2800 01/13/14 0407 01/14/14 0443  HGB 8.3* 7.9* 8.2*  HCT 25.4* 24.0* 25.5*  PLT 242 238 235  LABPROT 14.8 15.6* 15.0  INR 1.16 1.24 1.18  HEPARINUNFRC 0.63 0.50 <0.10*  CREATININE 2.22* 2.07* 2.14*    Estimated Creatinine Clearance: 43.2 ml/min (by C-G formula based on Cr of 2.14).  Assessment: 67yoM with T3 prostate cancer, s/p radical prostatectomy w/ bilateral pelvic lymphadenectomy 8/14, admitted 8/25 for nausea/emesis and AKI.  On warfarin 7.5mg  daily at home d/t Hx DVT in 2012; held prior to procedure 8/14 but never resumed d/t post-op anemia/blood loss. Given clearance to resume anticoagulation per urology on this admission; requested bridge to warfarin with heparin per cardiology.  Heparin was turned off last night per verbal order from Dr. Terrence Dupont due to bloody urine reported by RN.  Warfarin doses 8/25-28: 7.5mg , 7.5mg , 10mg , 10mg   INR today = 1.18, decreased surprising despite giving doses higher than reported home regimen  One unit PRBC 8/26:  Hgb, platelets low but stable.  FOBT (-) on 8/26  Per RN, no further blood in urine since last night per RN.  Per RN, plan is to monitor off of heparin today and can resume warfarin at 1800 tonight if no further blood in urine.    SCr remaining elevated, CrCl ~43 ml/min  Goal of Therapy:  INR 2-3 Heparin level 0.3-0.7 units/ml Monitor platelets by anticoagulation protocol: Yes   Plan:   Warfarin 15mg  today at 1800 if no further bleeding in urine.  Plan to resume  heparin bridge while INR subtherapeutic?  Will f/u evaluation and recommendations from MD.  Hershal Coria, PharmD, BCPS Pager: [phone] 01/14/2014 7:15 AM

## 2014-01-16 LAB — CULTURE, BLOOD (ROUTINE X 2)
CULTURE: NO GROWTH
Culture: NO GROWTH

## 2014-10-20 ENCOUNTER — Encounter: Payer: Self-pay | Admitting: Radiation Oncology

## 2014-10-20 NOTE — Progress Notes (Signed)
GU Location of Tumor / Histology: High risk prostate ca s/p robotic prostatectomy done 12/30/2013 with POSITIVE bladder neck and apical margins due to smooth muscle invasion  If Prostate Cancer, Gleason Score is (4 + 3) and PSA is (0.04) on 09/2014  [name] returned May 2016 with a rising rising from 0.01 in January 2016 to 0.04 09/2014   Biopsies of prostate revealed:    Past/Anticipated interventions by urology, if any: prostatectomy and referral to radiation oncology  Past/Anticipated interventions by medical oncology, if any: no  Weight changes, if any: no  Bowel/Bladder complaints, if any: stress urinary incontinence   Nausea/Vomiting, if any: no  Pain issues, if any:  no  SAFETY ISSUES:  Prior radiation? no  Pacemaker/ICD? no  Possible current pregnancy? no  Is the patient on methotrexate? no  Current Complaints / other details:  69 year old male. Initial PSA was 26.96. Bone scan negative. NKDA. FBX:UXYB Ether Griffins, MD. Married with  1 son and 3 daughters. Prostate volume was 21.35 cc.

## 2014-10-23 ENCOUNTER — Ambulatory Visit
Admission: RE | Admit: 2014-10-23 | Discharge: 2014-10-23 | Disposition: A | Discharge status: Home / Self Care | Payer: Medicare Other | Source: Ambulatory Visit | Attending: Radiation Oncology | Admitting: Radiation Oncology

## 2014-10-23 ENCOUNTER — Encounter: Payer: Self-pay | Admitting: Radiation Oncology

## 2014-10-23 VITALS — BP 132/54 | HR 84 | Resp 16 | Ht 74.0 in | Wt 260.6 lb

## 2014-10-23 DIAGNOSIS — C61 Malignant neoplasm of prostate: Secondary | ICD-10-CM | POA: Insufficient documentation

## 2014-10-23 DIAGNOSIS — Z86718 Personal history of other venous thrombosis and embolism: Secondary | ICD-10-CM | POA: Insufficient documentation

## 2014-10-23 NOTE — Progress Notes (Signed)
See progress note under physician encounter. 

## 2014-10-23 NOTE — Progress Notes (Signed)
Radiation Oncology         [phone]) [phone] ________________________________  Initial outpatient Consultation  Name: [name]  MRN: [id number]  Date: 10/23/2014  DOB: July 21, 1945  ER:DEYCXKG,YJEH S, MD  Alexis Frock, MD   REFERRING PHYSICIAN: Alexis Frock, MD  DIAGNOSIS: 69 year old gentleman with detectable rising PSA of 0.04. 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    ICD-9-CM ICD-10-CM   1. Prostate cancer [place] ILLNESS::[name] is a 69 y.o. male with a history of multiple negative prostate biopsies for elevated PSA.  He had an elevated PSA of 9.02 prompting a prostate biopsy done by Dr.Nesi on 12/31/06. Sextant biopsies at the time showed only benign tissue. The patient returned with a marked elevation of his PSA at 29.96 prompting another prostate biopsy on 09/12/13. At that time 8 out of 12 core biopsies were positive showing Gleason's score of 3+3 bilaterally. Patient proceeded to radical prostatectomy with Dr.Manny on 12/30/13. The 5 sampled lymph nodes were all negative. The patient was found to have adenocarcinoma involving both lobes with a Gleason's score of 4+3. Margins were broadly positive at the left and right apex as well as the left and right bladder neck margins. Seminal vesicles were negative. The patient was staged as pT3aN0 disease. His PSA was initially undectectable in October 2015. On 06/08/14 his PSA was found to be 0.01. Follow up PSA on 10/09/14 was somewhat higher at 0.04. The patient has kindly been referred for discussion of possible prostatic fossa radiotherapy.    PREVIOUS RADIATION THERAPY: No  PAST MEDICAL HISTORY:  has a past medical history of Hypertension; Leg cramps; Varicose veins; DVT (deep venous thrombosis); Swelling of lower extremity; Prostate cancer; and Difficulty sleeping.    PAST SURGICAL HISTORY: Past Surgical History   Procedure Laterality Date  . Nasal sinus surgery    . Vein surgery      RT LEG  . Hernia repair  2012    INGUINAL  . Robot assisted laparoscopic radical prostatectomy N/A 12/30/2013    Procedure: ROBOTIC ASSISTED LAPAROSCOPIC RADICAL PROSTATECTOMY WITH INDOCYANINE GREEN DYE;  Surgeon: Alexis Frock, MD;  Location: [facility];  Service: Urology;  Laterality: N/A;  . Lymphadenectomy Bilateral 12/30/2013    Procedure: PELVIC LYMPH NODE DISSECTION;  Surgeon: Alexis Frock, MD;  Location: [facility];  Service: Urology;  Laterality: Bilateral;    FAMILY HISTORY: family history is not on file.  SOCIAL HISTORY:  reports that he has never smoked. He has never used smokeless tobacco. He reports that he does not drink alcohol or use illicit drugs.  ALLERGIES: Review of patient's allergies indicates no known allergies.  MEDICATIONS:  Current Outpatient Prescriptions  Medication Sig Dispense Refill  . amLODipine (NORVASC) 5 MG tablet Take 5 mg by mouth daily.    . ferrous sulfate 325 (65 FE) MG tablet Take 1 tablet (325 mg total) by mouth 3 (three) times daily with meals. 90 tablet 3  . warfarin (COUMADIN) 7.5 MG tablet Take 7.5 mg by mouth daily.    Marland Kitchen ketorolac (ACULAR) 0.5 % ophthalmic solution   0   No current facility-administered medications for this encounter.    REVIEW OF SYSTEMS:  A 15 point review of systems is documented in the electronic medical record. This was obtained by the nursing staff. However, I reviewed this with the patient to discuss relevant findings and make appropriate changes.  Pertinent items are  noted in HPI.   Patient continence is now very good following physical therapy and pelvic floor exercises.  Denies dysuria or hematuria. Denies weight loss or night sweats. Reports stress incontinence occurs less frequently. Denies bone pain. Denies urgency or frequency.   PHYSICAL EXAM:  height is 6\' 2"  (1.88 m) and weight is 260 lb 9.6 oz (118.207 kg). His blood pressure is 132/54  and his pulse is 84. His respiration is 16.   The patient's head is normocephalic and atraumatic. Extremities are free of cyanosis clubbing and edema The patient is neurologically grossly intact. Speech is fluent articulate Gait is normal The patient's mood and affect are appropriate  KPS = 90  100 - Normal; no complaints; no evidence of disease. 90   - Able to carry on normal activity; minor signs or symptoms of disease. 80   - Normal activity with effort; some signs or symptoms of disease. 40   - Cares for self; unable to carry on normal activity or to do active work. 60   - Requires occasional assistance, but is able to care for most of his personal needs. 50   - Requires considerable assistance and frequent medical care. 69   - Disabled; requires special care and assistance. 51   - Severely disabled; hospital admission is indicated although death not imminent. 79   - Very sick; hospital admission necessary; active supportive treatment necessary. 10   - Moribund; fatal processes progressing rapidly. 0     - Dead  Karnofsky DA, Abelmann [place], Craver LS and Burchenal [facility] [phone]) The use of the nitrogen mustards in the palliative treatment of carcinoma: with particular reference to bronchogenic carcinoma Cancer 1 [phone]  LABORATORY DATA:  Lab Results  Component Value Date   WBC 6.8 01/14/2014   HGB 8.2* 01/14/2014   HCT 25.5* 01/14/2014   MCV 87.6 01/14/2014   PLT 235 01/14/2014   Lab Results  Component Value Date   NA 141 01/14/2014   K 4.3 01/14/2014   CL 105 01/14/2014   CO2 22 01/14/2014   Lab Results  Component Value Date   ALT 35 01/14/2014   AST 29 01/14/2014   ALKPHOS 64 01/14/2014   BILITOT 0.2* 01/14/2014     RADIOGRAPHY:  I personally reviewed imaging found in this patient's PACS timeline including staging bone scan, CT abd/pelvis and prostate MRI    IMPRESSION: The patient is a 69 year old gentleman s/p prostatectomy with adverse pathology features and a  detectable rising PSA of 0.04. He would likely benefit from radiotherapy at this time given his high risk pathology features and detectable PSA.  PLAN:Today, I talked to the patient and family about the findings and work-up thus far.  We discussed the natural history of post prostectomy radiotherapy and general treatment, highlighting the role of radiotherapy in the management.  We discussed the available radiation techniques, and focused on the details of logistics and delivery.  We reviewed the anticipated acute and late sequelae associated with radiation in this setting.  The patient was encouraged to ask questions that I answered to the best of my ability.  I filled out a patient counseling form during our discussion including treatment diagrams.  We retained a copy for our records. The patient would like to proceed with radiation and will be scheduled for CT simulation.  CT simulation is scheduled for this Thursday at 10 am.  I spent 60 minutes minutes face to face with the patient and more than 50% of  that time was spent in counseling and/or coordination of care.     ------------------------------------------------  Sheral Apley. [name], M.D.   This document serves as a record of services personally performed by Tyler Pita, MD. It was created on his behalf by Derek Mound, a trained medical scribe. The creation of this record is based on the scribe's personal observations and the provider's statements to them. This document has been checked and approved by the attending provider.

## 2014-10-23 NOTE — Progress Notes (Signed)
Denies dysuria or hematuria. Denies weight loss or night sweats. Reports stress incontinence occurs less frequently. Denies bone pain. Denies urgency or frequency.

## 2014-10-26 ENCOUNTER — Ambulatory Visit
Admission: RE | Admit: 2014-10-26 | Discharge: 2014-10-26 | Disposition: A | Discharge status: Home / Self Care | Payer: Medicare Other | Source: Ambulatory Visit | Attending: Radiation Oncology | Admitting: Radiation Oncology

## 2014-10-26 DIAGNOSIS — C61 Malignant neoplasm of prostate: Secondary | ICD-10-CM | POA: Diagnosis not present

## 2014-10-26 NOTE — Progress Notes (Signed)
  Radiation Oncology         [phone] ________________________________  Name: [name] MRN: [id number]  Date: 10/26/2014  DOB: 24-Dec-1945  SIMULATION AND TREATMENT PLANNING NOTE    ICD-9-CM ICD-10-CM   1. Prostate cancer 185 C61     DIAGNOSIS:  69 year old gentleman with detectable rising PSA of 0.04. 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  NARRATIVE:  The patient was brought to the [facility].  Identity was confirmed.  All relevant records and images related to the planned course of therapy were reviewed.  The patient freely provided informed written consent to proceed with treatment after reviewing the details related to the planned course of therapy. The consent form was witnessed and verified by the simulation staff.  Then, the patient was set-up in a stable reproducible supine position for radiation therapy.  A vacuum lock pillow device was custom fabricated to position his legs in a reproducible immobilized position.  Then, I performed a urethrogram under sterile conditions to identify the prostatic apex.  CT images were obtained.  Surface markings were placed.  The CT images were loaded into the planning software.  Then the prostate target and avoidance structures including the rectum, bladder, bowel and hips were contoured.  Treatment planning then occurred.  The radiation prescription was entered and confirmed.  A total of one complex treatment devices were fabricated. I have requested : Intensity Modulated Radiotherapy (IMRT) is medically necessary for this case for the following reason:  Rectal sparing.Marland Kitchen  PLAN:  The patient will receive 68.4 Gy in 38 fractions.  ________________________________  Sheral Apley [name], M.D.

## 2014-10-26 NOTE — Progress Notes (Signed)
Patient brought medications to be added to list.Confirmed all and added losartan to list.

## 2014-10-26 NOTE — Addendum Note (Signed)
Encounter addended by: Norm Salt, RN on: 10/26/2014 11:17 AM<BR>     Documentation filed: Notes Section, Medications

## 2014-11-02 DIAGNOSIS — C61 Malignant neoplasm of prostate: Secondary | ICD-10-CM | POA: Diagnosis not present

## 2014-11-03 DIAGNOSIS — C61 Malignant neoplasm of prostate: Secondary | ICD-10-CM | POA: Diagnosis not present

## 2014-11-06 ENCOUNTER — Ambulatory Visit: Admission: RE | Admit: 2014-11-06 | Payer: Medicare Other | Source: Ambulatory Visit | Admitting: Radiation Oncology

## 2014-11-06 ENCOUNTER — Ambulatory Visit
Admission: RE | Admit: 2014-11-06 | Discharge: 2014-11-06 | Disposition: A | Discharge status: Home / Self Care | Payer: Medicare Other | Source: Ambulatory Visit | Attending: Radiation Oncology | Admitting: Radiation Oncology

## 2014-11-07 ENCOUNTER — Ambulatory Visit
Admission: RE | Admit: 2014-11-07 | Discharge: 2014-11-07 | Disposition: A | Discharge status: Home / Self Care | Payer: Medicare Other | Source: Ambulatory Visit | Attending: Radiation Oncology | Admitting: Radiation Oncology

## 2014-11-07 DIAGNOSIS — C61 Malignant neoplasm of prostate: Secondary | ICD-10-CM | POA: Diagnosis not present

## 2014-11-08 ENCOUNTER — Ambulatory Visit
Admission: RE | Admit: 2014-11-08 | Discharge: 2014-11-08 | Disposition: A | Discharge status: Home / Self Care | Payer: Medicare Other | Source: Ambulatory Visit | Attending: Radiation Oncology | Admitting: Radiation Oncology

## 2014-11-08 DIAGNOSIS — C61 Malignant neoplasm of prostate: Secondary | ICD-10-CM | POA: Diagnosis not present

## 2014-11-09 ENCOUNTER — Ambulatory Visit
Admission: RE | Admit: 2014-11-09 | Discharge: 2014-11-09 | Disposition: A | Discharge status: Home / Self Care | Payer: Medicare Other | Source: Ambulatory Visit | Attending: Radiation Oncology | Admitting: Radiation Oncology

## 2014-11-09 ENCOUNTER — Encounter: Payer: Self-pay | Admitting: Medical Oncology

## 2014-11-09 ENCOUNTER — Encounter: Payer: Self-pay | Admitting: Radiation Oncology

## 2014-11-09 VITALS — BP 138/59 | HR 79 | Temp 98.1°F | Resp 16 | Wt 260.0 lb

## 2014-11-09 DIAGNOSIS — C61 Malignant neoplasm of prostate: Secondary | ICD-10-CM

## 2014-11-09 NOTE — Progress Notes (Signed)
Oncology Nurse Navigator Documentation  Oncology Nurse Navigator Flowsheets 11/09/2014  Navigator Encounter Type Treatment  Patient Visit Type Radonc-I met Mr. Samad today, introduced myself and explained my role. I gave him my business card. He states he is doing well so far. He has just started his treatments. I asked him to call me with any questions or concerns. He voiced understanding.  Time Spent with Patient 15

## 2014-11-09 NOTE — Progress Notes (Signed)
Weight and vitals stable. Denies pain. Reports he is down to wearing one pad per day. Reports he only leaks when he sits and bends down to tie his shoes. Describes a strong steady urine stream. No difficulty emptying. Nocturia x 1. Denies dysuria or hematuria. Denies diarrhea.   BP 138/59 mmHg  Pulse 79  Temp(Src) 98.1 F (36.7 C) (Oral)  Resp 16  Wt 260 lb (117.935 kg)  SpO2 100% Wt Readings from Last 3 Encounters:  11/09/14 260 lb (117.935 kg)  10/23/14 260 lb 9.6 oz (118.207 kg)  01/12/14 244 lb 14.4 oz (111.086 kg)

## 2014-11-09 NOTE — Progress Notes (Signed)
Oriented patient to staff and routine of the clinic. Provided patient with RADIATION THERAPY AND YOU handbook then, reviewed pertinent information. Educated patient reference potential side effects and management such as, fatigue, skin changes, diarrhea, and urinary bladder changes. Encouraged patient to contact this RN with future needs and provided patient with business card. Patient verbalized understanding of all reviewed.

## 2014-11-09 NOTE — Progress Notes (Signed)
  Radiation Oncology         [phone]   Name: [name] MRN: [id number]   Date: 11/09/2014  DOB: Dec 26, 1945   Weekly Radiation Therapy Management    ICD-9-CM ICD-10-CM   1. Prostate cancer 185 C61     Current Dose: 5.4 Gy  Planned Dose:  68.4 Gy  Narrative The patient presents for routine under treatment assessment. Denies pain. Reports he is down to wearing one pad per day. Reports he only leaks when he sits and bends down to tie his shoes. Describes a strong steady urine stream. No difficulty emptying. Nocturia x 1. Denies dysuria or hematuria. Denies diarrhea.  The patient is without complaint. Set-up films were reviewed. The chart was checked.  Physical Findings  weight is 260 lb (117.935 kg). His oral temperature is 98.1 F (36.7 C). His blood pressure is 138/59 and his pulse is 79. His respiration is 16 and oxygen saturation is 100%. . Weight essentially stable.  No significant changes.  Impression The patient is tolerating radiation.  Plan Continue treatment as planned.    This document serves as a record of services personally performed by Tyler Pita, MD. It was created on his behalf by Darcus Austin, a trained medical scribe. The creation of this record is based on the scribe's personal observations and the provider's statements to them. This document has been checked and approved by the attending provider.      Sheral Apley [name], M.D.

## 2014-11-10 ENCOUNTER — Ambulatory Visit
Admission: RE | Admit: 2014-11-10 | Discharge: 2014-11-10 | Disposition: A | Discharge status: Home / Self Care | Payer: Medicare Other | Source: Ambulatory Visit | Attending: Radiation Oncology | Admitting: Radiation Oncology

## 2014-11-10 DIAGNOSIS — C61 Malignant neoplasm of prostate: Secondary | ICD-10-CM | POA: Diagnosis not present

## 2014-11-13 ENCOUNTER — Ambulatory Visit
Admission: RE | Admit: 2014-11-13 | Discharge: 2014-11-13 | Disposition: A | Discharge status: Home / Self Care | Payer: Medicare Other | Source: Ambulatory Visit | Attending: Radiation Oncology | Admitting: Radiation Oncology

## 2014-11-13 DIAGNOSIS — C61 Malignant neoplasm of prostate: Secondary | ICD-10-CM | POA: Diagnosis not present

## 2014-11-14 ENCOUNTER — Ambulatory Visit
Admission: RE | Admit: 2014-11-14 | Discharge: 2014-11-14 | Disposition: A | Discharge status: Home / Self Care | Payer: Medicare Other | Source: Ambulatory Visit | Attending: Radiation Oncology | Admitting: Radiation Oncology

## 2014-11-14 DIAGNOSIS — C61 Malignant neoplasm of prostate: Secondary | ICD-10-CM | POA: Diagnosis not present

## 2014-11-15 ENCOUNTER — Ambulatory Visit
Admission: RE | Admit: 2014-11-15 | Discharge: 2014-11-15 | Disposition: A | Discharge status: Home / Self Care | Payer: Medicare Other | Source: Ambulatory Visit | Attending: Radiation Oncology | Admitting: Radiation Oncology

## 2014-11-15 DIAGNOSIS — C61 Malignant neoplasm of prostate: Secondary | ICD-10-CM | POA: Diagnosis not present

## 2014-11-16 ENCOUNTER — Ambulatory Visit
Admission: RE | Admit: 2014-11-16 | Discharge: 2014-11-16 | Disposition: A | Discharge status: Home / Self Care | Payer: Medicare Other | Source: Ambulatory Visit | Attending: Radiation Oncology | Admitting: Radiation Oncology

## 2014-11-16 ENCOUNTER — Encounter: Payer: Self-pay | Admitting: Radiation Oncology

## 2014-11-16 VITALS — BP 153/61 | HR 68 | Resp 16 | Wt 260.2 lb

## 2014-11-16 DIAGNOSIS — C61 Malignant neoplasm of prostate: Secondary | ICD-10-CM | POA: Diagnosis not present

## 2014-11-16 NOTE — Progress Notes (Signed)
  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  Current Dose: 14.4 Gy  Planned Dose:  68.4 Gy  Narrative The patient presents for routine under treatment assessment. Oriented patient to staff and routine of the clinic. Provided patient with RADIATION THERAPY AND YOU handbook then, reviewed pertinent information. Educated patient reference potential side effects and management such as, fatigue, skin changes, diarrhea, and urinary bladder changes. Encouraged patient to contact this RN with future needs and provided patient with business card. Patient verbalized understanding of all reviewed.   Physical Findings  weight is 260 lb 3.2 oz (118.026 kg). His blood pressure is 153/61 and his pulse is 68. His respiration is 16. .  Weight is currently essentially stable with no significant changes in overall health noted. The patient is alert and oriented X3.  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 The patient is tolerating radiation.  Plan The patient was advised to continue treatment as planned. The patient was made aware of his appointment with radiation oncology next week.     This document serves as a record of services personally performed by Tyler Pita, MD. It was created on his behalf by Lenn Cal, a trained medical scribe. The creation of this record is based on the scribe's personal observations and the provider's statements to them. This document has been checked and approved by the attending  provider.   ____________________________________________________________________________________________________________________________  Sheral Apley. [name], M.D.

## 2014-11-16 NOTE — Progress Notes (Addendum)
Weight and vitals stable. Denies pain. Denies nocturia. Denies dysuria or hematuria. Denies diarrhea. Denies fatigue. Reports occasional leakage associated with caffeine intake. Describes a strong steady urine stream.  BP 153/61 mmHg  Pulse 68  Resp 16  Wt 260 lb 3.2 oz (118.026 kg) Wt Readings from Last 3 Encounters:  11/16/14 260 lb 3.2 oz (118.026 kg)  11/09/14 260 lb (117.935 kg)  10/23/14 260 lb 9.6 oz (118.207 kg)

## 2014-11-17 ENCOUNTER — Ambulatory Visit
Admission: RE | Admit: 2014-11-17 | Discharge: 2014-11-17 | Disposition: A | Discharge status: Home / Self Care | Payer: Medicare Other | Source: Ambulatory Visit | Attending: Radiation Oncology | Admitting: Radiation Oncology

## 2014-11-17 DIAGNOSIS — C61 Malignant neoplasm of prostate: Secondary | ICD-10-CM | POA: Diagnosis not present

## 2014-11-21 ENCOUNTER — Ambulatory Visit
Admission: RE | Admit: 2014-11-21 | Discharge: 2014-11-21 | Disposition: A | Discharge status: Home / Self Care | Payer: Medicare Other | Source: Ambulatory Visit | Attending: Radiation Oncology | Admitting: Radiation Oncology

## 2014-11-21 DIAGNOSIS — C61 Malignant neoplasm of prostate: Secondary | ICD-10-CM | POA: Diagnosis not present

## 2014-11-22 ENCOUNTER — Ambulatory Visit
Admission: RE | Admit: 2014-11-22 | Discharge: 2014-11-22 | Disposition: A | Discharge status: Home / Self Care | Payer: Medicare Other | Source: Ambulatory Visit | Attending: Radiation Oncology | Admitting: Radiation Oncology

## 2014-11-22 DIAGNOSIS — C61 Malignant neoplasm of prostate: Secondary | ICD-10-CM | POA: Diagnosis not present

## 2014-11-23 ENCOUNTER — Ambulatory Visit
Admission: RE | Admit: 2014-11-23 | Discharge: 2014-11-23 | Disposition: A | Discharge status: Home / Self Care | Payer: Medicare Other | Source: Ambulatory Visit | Attending: Radiation Oncology | Admitting: Radiation Oncology

## 2014-11-23 ENCOUNTER — Encounter: Payer: Self-pay | Admitting: Radiation Oncology

## 2014-11-23 VITALS — BP 153/66 | HR 77 | Resp 16 | Wt 261.5 lb

## 2014-11-23 DIAGNOSIS — C61 Malignant neoplasm of prostate: Secondary | ICD-10-CM

## 2014-11-23 NOTE — Progress Notes (Signed)
  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    ICD-9-CM ICD-10-CM   1. Prostate cancer 185 C61      Current Dose: 21.6 Gy  Planned Dose:  68.4 Gy  Narrative The patient presents for routine under treatment assessment. His weight and vitals are currently stable. He denies pain, nocturia, dysuria, hematuria or diarrhea. "I'm feeling great. I don't even feel like I'm taking it," Leisner responded when asking about how he felt about his treatment. He did mention there is some minor bladder leakage and having to use the bathroom "pretty often".   Physical Findings  weight is 261 lb 8 oz (118.616 kg). His blood pressure is 153/66 and his pulse is 77. His respiration is 16. .  Weight is currently essentially stable with no significant changes in overall health noted. The patient is alert and oriented x3.  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 The patient is tolerating radiation.  Plan The patient was advised to continue treatment as planned. Garverick was also informed that there are several options in medications if his bladder issues continue to be a concern or worsen. The patient was made aware of his appointment with radiation oncology next week.    This document serves as a record of services personally performed by Tyler Pita, MD. It was created on his behalf by Lenn Cal, a trained medical scribe. The creation of this record is based on the scribe's personal observations and the  provider's statements to them. This document has been checked and approved by the attending provider.   ____________________________________________________________________________________________________________________________  Sheral Apley. [name], M.D.

## 2014-11-23 NOTE — Progress Notes (Addendum)
Weight and vitals stable. Denies pain. Denies nocturia. Denies dysuria or hematuria. Denies diarrhea. Reports constipation in the morning but, none during the day. Reports occasional leakage associated with caffeine intake. Describes a strong steady urine stream.   BP 153/66 mmHg  Pulse 77  Resp 16  Wt 261 lb 8 oz (118.616 kg) Wt Readings from Last 3 Encounters:  11/23/14 261 lb 8 oz (118.616 kg)  11/16/14 260 lb 3.2 oz (118.026 kg)  11/09/14 260 lb (117.935 kg)

## 2014-11-24 ENCOUNTER — Ambulatory Visit
Admission: RE | Admit: 2014-11-24 | Discharge: 2014-11-24 | Disposition: A | Discharge status: Home / Self Care | Payer: Medicare Other | Source: Ambulatory Visit | Attending: Radiation Oncology | Admitting: Radiation Oncology

## 2014-11-24 DIAGNOSIS — C61 Malignant neoplasm of prostate: Secondary | ICD-10-CM | POA: Diagnosis not present

## 2014-11-27 ENCOUNTER — Ambulatory Visit
Admission: RE | Admit: 2014-11-27 | Discharge: 2014-11-27 | Disposition: A | Discharge status: Home / Self Care | Payer: Medicare Other | Source: Ambulatory Visit | Attending: Radiation Oncology | Admitting: Radiation Oncology

## 2014-11-27 DIAGNOSIS — C61 Malignant neoplasm of prostate: Secondary | ICD-10-CM | POA: Diagnosis not present

## 2014-11-28 ENCOUNTER — Ambulatory Visit
Admission: RE | Admit: 2014-11-28 | Discharge: 2014-11-28 | Disposition: A | Discharge status: Home / Self Care | Payer: Medicare Other | Source: Ambulatory Visit | Attending: Radiation Oncology | Admitting: Radiation Oncology

## 2014-11-28 DIAGNOSIS — C61 Malignant neoplasm of prostate: Secondary | ICD-10-CM | POA: Diagnosis not present

## 2014-11-29 ENCOUNTER — Ambulatory Visit
Admission: RE | Admit: 2014-11-29 | Discharge: 2014-11-29 | Disposition: A | Discharge status: Home / Self Care | Payer: Medicare Other | Source: Ambulatory Visit | Attending: Radiation Oncology | Admitting: Radiation Oncology

## 2014-11-29 DIAGNOSIS — C61 Malignant neoplasm of prostate: Secondary | ICD-10-CM | POA: Diagnosis not present

## 2014-11-30 ENCOUNTER — Ambulatory Visit
Admission: RE | Admit: 2014-11-30 | Discharge: 2014-11-30 | Disposition: A | Discharge status: Home / Self Care | Payer: Medicare Other | Source: Ambulatory Visit | Attending: Radiation Oncology | Admitting: Radiation Oncology

## 2014-11-30 DIAGNOSIS — C61 Malignant neoplasm of prostate: Secondary | ICD-10-CM | POA: Diagnosis not present

## 2014-12-01 ENCOUNTER — Ambulatory Visit
Admission: RE | Admit: 2014-12-01 | Discharge: 2014-12-01 | Disposition: A | Discharge status: Home / Self Care | Payer: Medicare Other | Source: Ambulatory Visit | Attending: Radiation Oncology | Admitting: Radiation Oncology

## 2014-12-01 VITALS — BP 154/66 | HR 75 | Resp 16 | Wt 263.4 lb

## 2014-12-01 DIAGNOSIS — C61 Malignant neoplasm of prostate: Secondary | ICD-10-CM

## 2014-12-01 NOTE — Progress Notes (Signed)
  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    ICD-9-CM ICD-10-CM   1. Prostate cancer 185 C61      Current Dose: 21.6 Gy  Planned Dose:  68.4 Gy  Narrative The patient presents for routine under treatment assessment. Denies pain. Reports urinary frequency and urgency began this week. Denies dysuria or hematuria. Denies diarrhea. Describes a strong steady urine stream Denies diarrhea. Denies fatigue.   Physical Findings  weight is 263 lb 6.4 oz (119.477 kg). His blood pressure is 154/66 and his pulse is 75. His respiration is 16. .  Weight is currently essentially stable with no significant changes in overall health noted.   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 The patient is tolerating radiation.  Plan The patient was advised to continue treatment as planned. [name] was also informed that there are several options in medications if his bladder issues continue to be a concern or worsen. The patient was made aware of his appointment with radiation oncology next week.    This document serves as a record of services personally performed by Tyler Pita, MD. It was created on his behalf by Lenn Cal, a trained medical scribe. The creation of this record is based on the scribe's personal observations and the provider's statements to them. This document has been checked and approved by the attending provider.    ____________________________________________________________________________________________________________________________  [name]. [name], M.D.

## 2014-12-01 NOTE — Progress Notes (Signed)
Weight and vitals stable. Denies pain. Reports urinary frequency and urgency began this week. Denies dysuria or hematuria. Denies diarrhea. Describes a strong steady urine stream Denies diarrhea. Denies fatigue.  BP 154/66 mmHg  Pulse 75  Resp 16  Wt 263 lb 6.4 oz (119.477 kg) Wt Readings from Last 3 Encounters:  12/01/14 263 lb 6.4 oz (119.477 kg)  11/23/14 261 lb 8 oz (118.616 kg)  11/16/14 260 lb 3.2 oz (118.026 kg)

## 2014-12-04 ENCOUNTER — Ambulatory Visit
Admission: RE | Admit: 2014-12-04 | Discharge: 2014-12-04 | Disposition: A | Discharge status: Home / Self Care | Payer: Medicare Other | Source: Ambulatory Visit | Attending: Radiation Oncology | Admitting: Radiation Oncology

## 2014-12-04 DIAGNOSIS — C61 Malignant neoplasm of prostate: Secondary | ICD-10-CM | POA: Diagnosis not present

## 2014-12-05 ENCOUNTER — Ambulatory Visit
Admission: RE | Admit: 2014-12-05 | Discharge: 2014-12-05 | Disposition: A | Discharge status: Home / Self Care | Payer: Medicare Other | Source: Ambulatory Visit | Attending: Radiation Oncology | Admitting: Radiation Oncology

## 2014-12-05 DIAGNOSIS — C61 Malignant neoplasm of prostate: Secondary | ICD-10-CM | POA: Diagnosis not present

## 2014-12-06 ENCOUNTER — Ambulatory Visit
Admission: RE | Admit: 2014-12-06 | Discharge: 2014-12-06 | Disposition: A | Discharge status: Home / Self Care | Payer: Medicare Other | Source: Ambulatory Visit | Attending: Radiation Oncology | Admitting: Radiation Oncology

## 2014-12-06 DIAGNOSIS — C61 Malignant neoplasm of prostate: Secondary | ICD-10-CM | POA: Diagnosis not present

## 2014-12-07 ENCOUNTER — Ambulatory Visit
Admission: RE | Admit: 2014-12-07 | Discharge: 2014-12-07 | Disposition: A | Discharge status: Home / Self Care | Payer: Medicare Other | Source: Ambulatory Visit | Attending: Radiation Oncology | Admitting: Radiation Oncology

## 2014-12-07 DIAGNOSIS — C61 Malignant neoplasm of prostate: Secondary | ICD-10-CM | POA: Diagnosis not present

## 2014-12-08 ENCOUNTER — Ambulatory Visit
Admission: RE | Admit: 2014-12-08 | Discharge: 2014-12-08 | Disposition: A | Discharge status: Home / Self Care | Payer: Medicare Other | Source: Ambulatory Visit | Attending: Radiation Oncology | Admitting: Radiation Oncology

## 2014-12-08 ENCOUNTER — Encounter: Payer: Self-pay | Admitting: Radiation Oncology

## 2014-12-08 VITALS — BP 142/67 | HR 83 | Temp 98.6°F | Resp 20 | Wt 261.8 lb

## 2014-12-08 DIAGNOSIS — C61 Malignant neoplasm of prostate: Secondary | ICD-10-CM

## 2014-12-08 NOTE — Progress Notes (Signed)
   Weekly Management Note:  Outpatient    ICD-9-CM ICD-10-CM   1. Prostate cancer 185 C61      Current Dose:  41.4 Gy  Projected Dose: 68.4 Gy  Narrative:  The patient presents for routine under treatment assessment.  CBCT/MVCT images/Port film x-rays were reviewed.  The chart was checked. Pt reports having slight dysuria, frequency, and urgency.  Denies nocturia, nausea, and hematuria.  Reports a good appetite, drinks plenty water, and has regular bowel movements. Wears depends (All symptoms stable)  Physical Findings:  Wt Readings from Last 3 Encounters:  12/08/14 261 lb 12.8 oz (118.752 kg)  12/01/14 263 lb 6.4 oz (119.477 kg)  11/23/14 261 lb 8 oz (118.616 kg)    weight is 261 lb 12.8 oz (118.752 kg). His oral temperature is 98.6 F (37 C). His blood pressure is 142/67 and his pulse is 83. His respiration is 20.  Alert and oriented times three with no distress. Pt is ambulatory.  CBC    Component Value Date/Time   WBC 6.8 01/14/2014 0443   RBC 2.91* 01/14/2014 0443   HGB 8.2* 01/14/2014 0443   HCT 25.5* 01/14/2014 0443   PLT 235 01/14/2014 0443   MCV 87.6 01/14/2014 0443   MCH 28.2 01/14/2014 0443   MCHC 32.2 01/14/2014 0443   RDW 14.7 01/14/2014 0443   LYMPHSABS 0.8 01/10/2014 0916   MONOABS 0.5 01/10/2014 0916   EOSABS 0.1 01/10/2014 0916   BASOSABS 0.0 01/10/2014 0916     CMP     Component Value Date/Time   NA 141 01/14/2014 0443   K 4.3 01/14/2014 0443   CL 105 01/14/2014 0443   CO2 22 01/14/2014 0443   GLUCOSE 107* 01/14/2014 0443   BUN 38* 01/14/2014 0443   CREATININE 2.14* 01/14/2014 0443   CALCIUM 9.5 01/14/2014 0443   PROT 6.8 01/14/2014 0443   ALBUMIN 3.3* 01/14/2014 0443   AST 29 01/14/2014 0443   ALT 35 01/14/2014 0443   ALKPHOS 64 01/14/2014 0443   BILITOT 0.2* 01/14/2014 0443   GFRNONAA 30* 01/14/2014 0443   GFRAA 35* 01/14/2014 0443     Impression:  The patient is tolerating radiotherapy.   Plan:  Continue radiotherapy as  planned.  This document serves as a record of services personally performed by Eppie Gibson, MD. It was created on her behalf by Darcus Austin, a trained medical scribe. The creation of this record is based on the scribe's personal observations and the provider's statements to them. This document has been checked and approved by the attending provider.     -----------------------------------  Eppie Gibson, MD

## 2014-12-08 NOTE — Progress Notes (Signed)
Weekly rad tx prostate, has slight dysuria, frequency,urgency, no nocturia, appetite good, drinks plenty water, regular bowel movements, no nausea, no hematuria, wears depends BP 142/67 mmHg  Pulse 83  Temp(Src) 98.6 F (37 C) (Oral)  Resp 20  Wt 261 lb 12.8 oz (118.752 kg)  Wt Readings from Last 3 Encounters:  12/08/14 261 lb 12.8 oz (118.752 kg)  12/01/14 263 lb 6.4 oz (119.477 kg)  11/23/14 261 lb 8 oz (118.616 kg)   9:35 AM

## 2014-12-11 ENCOUNTER — Ambulatory Visit
Admission: RE | Admit: 2014-12-11 | Discharge: 2014-12-11 | Disposition: A | Discharge status: Home / Self Care | Payer: Medicare Other | Source: Ambulatory Visit | Attending: Radiation Oncology | Admitting: Radiation Oncology

## 2014-12-11 DIAGNOSIS — C61 Malignant neoplasm of prostate: Secondary | ICD-10-CM | POA: Diagnosis not present

## 2014-12-12 ENCOUNTER — Ambulatory Visit
Admission: RE | Admit: 2014-12-12 | Discharge: 2014-12-12 | Disposition: A | Discharge status: Home / Self Care | Payer: Medicare Other | Source: Ambulatory Visit | Attending: Radiation Oncology | Admitting: Radiation Oncology

## 2014-12-12 DIAGNOSIS — C61 Malignant neoplasm of prostate: Secondary | ICD-10-CM | POA: Diagnosis not present

## 2014-12-13 ENCOUNTER — Ambulatory Visit
Admission: RE | Admit: 2014-12-13 | Discharge: 2014-12-13 | Disposition: A | Discharge status: Home / Self Care | Payer: Medicare Other | Source: Ambulatory Visit | Attending: Radiation Oncology | Admitting: Radiation Oncology

## 2014-12-13 DIAGNOSIS — C61 Malignant neoplasm of prostate: Secondary | ICD-10-CM | POA: Diagnosis not present

## 2014-12-14 ENCOUNTER — Ambulatory Visit
Admission: RE | Admit: 2014-12-14 | Discharge: 2014-12-14 | Disposition: A | Discharge status: Home / Self Care | Payer: Medicare Other | Source: Ambulatory Visit | Attending: Radiation Oncology | Admitting: Radiation Oncology

## 2014-12-14 ENCOUNTER — Encounter: Payer: Self-pay | Admitting: Radiation Oncology

## 2014-12-14 VITALS — BP 143/75 | HR 82 | Temp 97.5°F | Resp 20 | Wt 263.8 lb

## 2014-12-14 DIAGNOSIS — C61 Malignant neoplasm of prostate: Secondary | ICD-10-CM | POA: Diagnosis not present

## 2014-12-14 NOTE — Progress Notes (Signed)
Weekly rad txs 27/38,    No hematuria,  Good strong stream, no nocturia, has urgency and frequency after rad tx for every 15 min for about  6 times, then bladder is normal, appetite good, regular bowel movements, good energy ,exercises No pain BP 143/75 mmHg  Pulse 82  Temp(Src) 97.5 F (36.4 C) (Oral)  Resp 20  Wt 263 lb 12.8 oz (119.659 kg)  Wt Readings from Last 3 Encounters:  12/14/14 263 lb 12.8 oz (119.659 kg)  12/08/14 261 lb 12.8 oz (118.752 kg)  12/01/14 263 lb 6.4 oz (119.477 kg)

## 2014-12-14 NOTE — Progress Notes (Signed)
  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   Current Dose: 48.6 Gy  Planned Dose:  68.4 Gy  Narrative The patient presents for routine under treatment assessment. He has completed 27 out of 38 treatments to date. He currently denies symptoms including hematuria, nocturia, pain, or change in appetite. He reports a strong stream, good energy, and regular bowel movements. He has began exercising. Symptoms associated with his bladder have improved, however, he has urgency and frequency after rad tx for every 15 min for about  6 times. Otherwise he reports that his bladder is normal. He was not accompanied by anyone at today's visit but projects a healthy mental disposition.  Physical Findings  weight is 263 lb 12.8 oz (119.659 kg). His oral temperature is 97.5 F (36.4 C). His blood pressure is 143/75 and his pulse is 82. His respiration is 20. Marland Kitchen  His weight is currently stable with no significant changes in overall health to be noted.   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 The patient is tolerating radiation well.  Plan The patient was advised to continue treatment as planned. He was also informed that there are several options in medications if his bladder issues continue to be a concern or worsen. Any questions vocalized by the patient were fully addressed. The patient was made aware of his appointment with radiation oncology next week. If he develops any further questions or concerns, he has been  encouraged to contact Dr. [name], MD.    This document serves as a record of services personally performed by Tyler Pita, MD. It was created on his behalf by Lenn Cal, a trained medical scribe. The creation of this record is based on the scribe's personal observations and the provider's statements to them. This document has been checked and approved by the attending provider.   ____________________________________________________________________________________________________________________________  Sheral Apley. [name], M.D.

## 2014-12-15 ENCOUNTER — Ambulatory Visit
Admission: RE | Admit: 2014-12-15 | Discharge: 2014-12-15 | Disposition: A | Discharge status: Home / Self Care | Payer: Medicare Other | Source: Ambulatory Visit | Attending: Radiation Oncology | Admitting: Radiation Oncology

## 2014-12-15 DIAGNOSIS — C61 Malignant neoplasm of prostate: Secondary | ICD-10-CM | POA: Diagnosis not present

## 2014-12-18 ENCOUNTER — Ambulatory Visit
Admission: RE | Admit: 2014-12-18 | Discharge: 2014-12-18 | Disposition: A | Discharge status: Home / Self Care | Payer: Medicare Other | Source: Ambulatory Visit | Attending: Radiation Oncology | Admitting: Radiation Oncology

## 2014-12-18 DIAGNOSIS — C61 Malignant neoplasm of prostate: Secondary | ICD-10-CM | POA: Diagnosis not present

## 2014-12-19 ENCOUNTER — Ambulatory Visit
Admission: RE | Admit: 2014-12-19 | Discharge: 2014-12-19 | Disposition: A | Discharge status: Home / Self Care | Payer: Medicare Other | Source: Ambulatory Visit | Attending: Radiation Oncology | Admitting: Radiation Oncology

## 2014-12-19 DIAGNOSIS — C61 Malignant neoplasm of prostate: Secondary | ICD-10-CM | POA: Diagnosis not present

## 2014-12-20 ENCOUNTER — Ambulatory Visit
Admission: RE | Admit: 2014-12-20 | Discharge: 2014-12-20 | Disposition: A | Discharge status: Home / Self Care | Payer: Medicare Other | Source: Ambulatory Visit | Attending: Radiation Oncology | Admitting: Radiation Oncology

## 2014-12-20 DIAGNOSIS — C61 Malignant neoplasm of prostate: Secondary | ICD-10-CM | POA: Diagnosis not present

## 2014-12-21 ENCOUNTER — Ambulatory Visit
Admission: RE | Admit: 2014-12-21 | Discharge: 2014-12-21 | Disposition: A | Discharge status: Home / Self Care | Payer: Medicare Other | Source: Ambulatory Visit | Attending: Radiation Oncology | Admitting: Radiation Oncology

## 2014-12-21 DIAGNOSIS — C61 Malignant neoplasm of prostate: Secondary | ICD-10-CM | POA: Diagnosis not present

## 2014-12-22 ENCOUNTER — Ambulatory Visit
Admission: RE | Admit: 2014-12-22 | Discharge: 2014-12-22 | Disposition: A | Discharge status: Home / Self Care | Payer: Medicare Other | Source: Ambulatory Visit | Attending: Radiation Oncology | Admitting: Radiation Oncology

## 2014-12-22 ENCOUNTER — Encounter: Payer: Self-pay | Admitting: Radiation Oncology

## 2014-12-22 VITALS — BP 155/67 | HR 77 | Resp 16 | Wt 264.7 lb

## 2014-12-22 DIAGNOSIS — C61 Malignant neoplasm of prostate: Secondary | ICD-10-CM | POA: Diagnosis not present

## 2014-12-22 NOTE — Progress Notes (Signed)
Weight and vitals stable. Denies pain. Denies nocturia, dysuria or hematuria. Denies diarrhea or fatigue.  BP 155/67 mmHg  Pulse 77  Resp 16  Wt 264 lb 11.2 oz (120.067 kg) Wt Readings from Last 3 Encounters:  12/22/14 264 lb 11.2 oz (120.067 kg)  12/14/14 263 lb 12.8 oz (119.659 kg)  12/08/14 261 lb 12.8 oz (118.752 kg)

## 2014-12-22 NOTE — Progress Notes (Signed)
   Department of Radiation Oncology  Phone:  [phone] Fax:        [phone]  Weekly Treatment Note    Name: [name] Date: 12/22/2014 MRN: [id number] DOB: 03/20/1946   Current dose: 59.4 Gy  Current fraction: 33   MEDICATIONS: Current Outpatient Prescriptions  Medication Sig Dispense Refill  . amLODipine (NORVASC) 5 MG tablet Take 5 mg by mouth daily.    . ferrous sulfate 325 (65 FE) MG tablet Take 1 tablet (325 mg total) by mouth 3 (three) times daily with meals. 90 tablet 3  . ketorolac (ACULAR) 0.5 % ophthalmic solution Finished.eyes have cleared up.  0  . losartan (COZAAR) 100 MG tablet Take 100 mg by mouth daily.    Marland Kitchen warfarin (COUMADIN) 7.5 MG tablet Take 7.5 mg by mouth daily.     No current facility-administered medications for this encounter.     ALLERGIES: Review of patient's allergies indicates no known allergies.   LABORATORY DATA:  Lab Results  Component Value Date   WBC 6.8 01/14/2014   HGB 8.2* 01/14/2014   HCT 25.5* 01/14/2014   MCV 87.6 01/14/2014   PLT 235 01/14/2014   Lab Results  Component Value Date   NA 141 01/14/2014   K 4.3 01/14/2014   CL 105 01/14/2014   CO2 22 01/14/2014   Lab Results  Component Value Date   ALT 35 01/14/2014   AST 29 01/14/2014   ALKPHOS 64 01/14/2014   BILITOT 0.2* 01/14/2014     NARRATIVE: [name] was seen today for weekly treatment management. The chart was checked and the patient's films were reviewed.  Weight and vitals stable. Denies pain. Denies nocturia, dysuria or hematuria. Denies diarrhea or fatigue.  Reports frequent urination, especially immediately after treatment.  Reports sleeping well.   PHYSICAL EXAMINATION: weight is 264 lb 11.2 oz (120.067 kg). His blood pressure is 155/67 and his pulse is 77. His respiration is 16.        ASSESSMENT: The patient is doing satisfactorily with treatment.  PLAN: We will continue with the patient's radiation treatment as planned.       ------------------------------------------------  Jodelle Gross, MD, PhD  This document serves as a record of services personally performed by Kyung Rudd, MD. It was created on his behalf by Derek Mound, a trained medical scribe. The creation of this record is based on the scribe's personal observations and the provider's statements to them. This document has been checked and approved by the attending provider.

## 2014-12-25 ENCOUNTER — Ambulatory Visit
Admission: RE | Admit: 2014-12-25 | Discharge: 2014-12-25 | Disposition: A | Discharge status: Home / Self Care | Payer: Medicare Other | Source: Ambulatory Visit | Attending: Radiation Oncology | Admitting: Radiation Oncology

## 2014-12-25 DIAGNOSIS — C61 Malignant neoplasm of prostate: Secondary | ICD-10-CM | POA: Diagnosis not present

## 2014-12-26 ENCOUNTER — Ambulatory Visit
Admission: RE | Admit: 2014-12-26 | Discharge: 2014-12-26 | Disposition: A | Discharge status: Home / Self Care | Payer: Medicare Other | Source: Ambulatory Visit | Attending: Radiation Oncology | Admitting: Radiation Oncology

## 2014-12-26 DIAGNOSIS — C61 Malignant neoplasm of prostate: Secondary | ICD-10-CM | POA: Diagnosis not present

## 2014-12-27 ENCOUNTER — Ambulatory Visit
Admission: RE | Admit: 2014-12-27 | Discharge: 2014-12-27 | Disposition: A | Discharge status: Home / Self Care | Payer: Medicare Other | Source: Ambulatory Visit | Attending: Radiation Oncology | Admitting: Radiation Oncology

## 2014-12-27 DIAGNOSIS — C61 Malignant neoplasm of prostate: Secondary | ICD-10-CM | POA: Diagnosis not present

## 2014-12-28 ENCOUNTER — Encounter: Payer: Self-pay | Admitting: Radiation Oncology

## 2014-12-28 ENCOUNTER — Ambulatory Visit
Admission: RE | Admit: 2014-12-28 | Discharge: 2014-12-28 | Disposition: A | Discharge status: Home / Self Care | Payer: Medicare Other | Source: Ambulatory Visit | Attending: Radiation Oncology | Admitting: Radiation Oncology

## 2014-12-28 ENCOUNTER — Ambulatory Visit: Payer: Medicare Other

## 2014-12-28 VITALS — BP 149/66 | HR 74 | Temp 98.0°F | Ht 74.0 in | Wt 266.3 lb

## 2014-12-28 DIAGNOSIS — C61 Malignant neoplasm of prostate: Secondary | ICD-10-CM

## 2014-12-28 NOTE — Progress Notes (Signed)
  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   Current Dose: 66.6 Gy  Planned Dose:  68.4 Gy  Narrative The patient presents for routine under treatment assessment. Penultimate txt. PAIN:  He is currently in no pain, but intermittent stinging at the end of urine stream.  URINARY:  Pt reports urinary urgency and pain with urination intermittent stinging as stated above. Pt states he urinates 1 - 2 times per night.  BOWEL:  Pt reports a soft bowel movement everyday/everyother day.  OTHER:  Denies any fatigue   Physical Findings  height is 6\' 2"  (1.88 m) and weight is 266 lb 4.8 oz (120.793 kg). His temperature is 98 F (36.7 C). His blood pressure is 149/66 and his pulse is 74. Marland Kitchen  His weight is currently stable with no significant changes in overall health to be noted.   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 The patient is tolerating radiation well.  Plan The patient was advised to continue treatment as planned. If he develops any further questions or concerns, he has been encouraged to contact Dr. [name], MD.   This document serves as a record of services personally performed by Tyler Pita, MD. It was created on his behalf by Darcus Austin, a trained medical scribe. The creation of this record is based on the scribe's personal observations and the provider's statements to them. This document has been checked and approved by the attending provider.      ____________________________________________________________________________________________________________________________  Sheral Apley. [name], M.D.

## 2014-12-28 NOTE — Progress Notes (Signed)
PAIN: He is currently in no pain. intermittent stinging at the end of urine stream over N/A. URINARY: Pt reports urinary urgency and pain with urination intermittent stinging as stated above Pt states they urinate 1 - 2 times per night.  BOWEL: Pt reports None a soft bowel movement everyday/everyother day. OTHER: Pt complains of Denies any fatigue  BP 149/66 mmHg  Pulse 74  Temp(Src) 98 F (36.7 C)  Ht 6\' 2"  (1.88 m)  Wt 266 lb 4.8 oz (120.793 kg)  BMI 34.18 kg/m2 Wt Readings from Last 3 Encounters:  12/28/14 266 lb 4.8 oz (120.793 kg)  12/22/14 264 lb 11.2 oz (120.067 kg)  12/14/14 263 lb 12.8 oz (119.659 kg)

## 2014-12-29 ENCOUNTER — Ambulatory Visit
Admission: RE | Admit: 2014-12-29 | Discharge: 2014-12-29 | Disposition: A | Discharge status: Home / Self Care | Payer: Medicare Other | Source: Ambulatory Visit | Attending: Radiation Oncology | Admitting: Radiation Oncology

## 2014-12-29 ENCOUNTER — Encounter: Payer: Self-pay | Admitting: Radiation Oncology

## 2014-12-29 DIAGNOSIS — C61 Malignant neoplasm of prostate: Secondary | ICD-10-CM | POA: Diagnosis not present

## 2015-01-14 NOTE — Progress Notes (Signed)
  Radiation Oncology         [phone] ________________________________  Name: [name] MRN: [id number]  Date: 12/29/2014  DOB: [birth year]  End of Treatment Note  Diagnosis:   69 year old gentleman with detectable rising PSA of 0.04. 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     Indication for treatment:  Curative, Adjuvant Prostatic Fossa Radiotherapy       Radiation treatment dates:   11/07/2014-12/29/2014  Site/dose:   The prostatic fossa was treated to 68.4 Gy in 38 fractions of 1.8 Gy  Beams/energy:   The prostatic fossa was treated using helical intensity modulated radiotherapy delivering 6 megavolt photons. Image guidance was performed with megavoltage CT studies prior to each fraction. He was immobilized with a body fix lower extremity mold.  Narrative: The patient tolerated radiation treatment relatively well.   He is had o pain, but intermittent stinging at the end of urine stream. Pt reported urinary urgency and pain with urination intermittent stinging as stated above. Pt states he urinates 1 - 2 times per night. Pt reported a soft bowel movement everyday/everyother day. Denied any fatigue   Plan: The patient has completed radiation treatment. He will return to radiation oncology clinic for routine followup in one month. I advised him to call or return sooner if he has any questions or concerns related to his recovery or treatment. ________________________________  Sheral Apley. [name], M.D.

## 2015-02-01 ENCOUNTER — Encounter: Payer: Self-pay | Admitting: Radiation Oncology

## 2015-02-01 ENCOUNTER — Ambulatory Visit
Admission: RE | Admit: 2015-02-01 | Discharge: 2015-02-01 | Disposition: A | Discharge status: Home / Self Care | Payer: Medicare Other | Source: Ambulatory Visit | Attending: Radiation Oncology | Admitting: Radiation Oncology

## 2015-02-01 VITALS — BP 142/62 | HR 76 | Resp 16 | Wt 253.7 lb

## 2015-02-01 DIAGNOSIS — C61 Malignant neoplasm of prostate: Secondary | ICD-10-CM

## 2015-02-01 NOTE — Progress Notes (Signed)
Vitals stable. 13 lb weight loss noted.  Denies pain. Reports dysuria has resolved. Denies hematuria. Reports urinary urgency has resolved. Reports nocturia x 1. Denies diarrhea. Reports his energy level has returned to pretreatment state. 01/04/2015 PSA 0.04.  BP 142/62 mmHg  Pulse 76  Resp 16  Wt 253 lb 11.2 oz (115.078 kg) Wt Readings from Last 3 Encounters:  02/01/15 253 lb 11.2 oz (115.078 kg)  12/28/14 266 lb 4.8 oz (120.793 kg)  12/22/14 264 lb 11.2 oz (120.067 kg)

## 2015-02-01 NOTE — Progress Notes (Signed)
Radiation Oncology         ([phone]) [phone] ________________________________  Name: [name] MRN: [id number]  Date: 02/01/2015  DOB: 1945-06-24  Follow-Up Visit Note  CC: [name], MD  Alexis Frock, MD  Diagnosis: [name] is a 69 year old gentleman presenting to clinic in regards to his detectable rising PSA of 0.04. 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    ICD-9-CM ICD-10-CM   1. Prostate cancer 185 C61     Interval Since Last Radiation:  1  months 3 days [11/07/2014-12/29/2014] at 38 fractions   Narrative:  The patient returns today for routine follow-up appointment with radiation oncology. The patient's vitals stable are currently stable. He has experienced a 13 pound weight loss.  He denies symptoms of pain, diarrhea, or hematuria. His symptom of dysuria, fatigue, urinary urgency has resolved. The patient reports nocturia x 1. His most recent PSA, taken on 01/04/2015 was measured to be 0.04. In regards to rectum or bladder issues he stated,"I don't have any stinging. I sleep good at night. It don't wake me up. It's getting back to how it's supposed to be. I have a little small leakage when I sneeze a little bit, or when I sit down, trying to put my socks on. Everything just been great" The patient speaks with slight dialect. He projected a healthy mental status at today's radiation oncology appointment and was not accompanied by family for today's appointment. He did express concern over his $50,000+ bill for his treatment. The patient also asked why he did not receive seeds? This questions was addressed.               ALLERGIES:  has No Known Allergies.  Meds: Current Outpatient Prescriptions  Medication Sig Dispense Refill  . amLODipine (NORVASC) 5 MG tablet Take 5 mg by mouth daily.    . ferrous sulfate 325 (65 FE) MG tablet Take 1 tablet (325 mg total) by mouth 3 (three) times  daily with meals. 90 tablet 3  . ketorolac (ACULAR) 0.5 % ophthalmic solution Finished.eyes have cleared up.  0  . losartan (COZAAR) 100 MG tablet Take 100 mg by mouth daily.    Marland Kitchen warfarin (COUMADIN) 7.5 MG tablet Take 7.5 mg by mouth daily.     No current facility-administered medications for this encounter.    Physical Findings: The patient is in no acute distress and is alert and oriented x3.  weight is 253 lb 11.2 oz (115.078 kg). His blood pressure is 142/62 and his pulse is 76. His respiration is 16.  The patient has no significant changes in the status of his overall health to be noted at this time. His weight loss of 13 pounds is to be monitored.  Lab Findings: Lab Results  Component Value Date   WBC 6.8 01/14/2014   HGB 8.2* 01/14/2014   HCT 25.5* 01/14/2014   PLT 235 01/14/2014    Lab Results  Component Value Date   NA 141 01/14/2014   K 4.3 01/14/2014   CO2 22 01/14/2014   GLUCOSE 107* 01/14/2014   BUN 38* 01/14/2014   CREATININE 2.14* 01/14/2014   BILITOT 0.2* 01/14/2014   ALKPHOS 64 01/14/2014   AST 29 01/14/2014   ALT 35 01/14/2014   PROT 6.8 01/14/2014   ALBUMIN 3.3* 01/14/2014   CALCIUM 9.5 01/14/2014   ANIONGAP 14 01/14/2014    Radiographic Findings: No results found.  Impression: [name] is a 69  year old gentleman presenting to clinic in regards to his detectable rising PSA of 0.04. 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 The patient is recovering well from the effects of radiation. He understands the results of his most recent PSA lab results. He understands the importance of monitoring his PSA levels in regards to the management of his disease, moving forward. The patient understands that he can access his appointment information via [facility]. The patient understands that he is follow-up with radiation oncology strictly on an as needed basis.   Plan: The  patient has been educated on the purpose and importance of monitoring PSA levels in regards to the management of his disease. He understands the risks of extreme weight loss and has been advised on healthy methods of weight management. The patient is aware of his follow-up appointment with radiation oncology to take place on an as needed basis. All vocalized questions and concerns have been addressed. If the patient develops any further questions or concerns in regards to his treatment and recovery, he has been encouraged to contact Dr. [name], MD.  He will continue to follow-up with urology for ongoing PSA determinations.  I will look forward to following his response through their correspondence, and be happy to participate in care if clinically indicated.  I talked to the patient about what to expect in the future, including his risk for erectile dysfunction and rectal bleeding.  I encouraged him to call or return to the office if he has any question about his previous radiation or possible radiation effects.  He was comfortable with this plan.   This document serves as a record of services personally performed by Tyler Pita, MD. It was created on his behalf by Lenn Cal, a trained medical scribe. The creation of this record is based on the scribe's personal observations and the provider's statements to them. This document has been checked and approved by the attending provider. _____________________________________  Sheral Apley. [name], M.D.

## 2016-06-17 IMAGING — CT CT ABD-PELV W/O CM
1 of 2 series · 14 of 32 positions shown, 18 images · non-contrast
Comparison: None.

CLINICAL DATA: Abdominal pain and vomiting; recent hernia repair
and prostatectomy

EXAM:
CT ABDOMEN AND PELVIS WITHOUT CONTRAST
TECHNIQUE: Multidetector CT imaging of the abdomen and pelvis was performed
following the standard protocol without IV contrast. Oral contrast
was administered.

[Series 2: abd/pel w/o · axial · non-contrast · 0.96mm/px · z∈[-356,+99]mm · 14 of 101 slices shown, 18 images]
[im 5/101  soft-tissue]
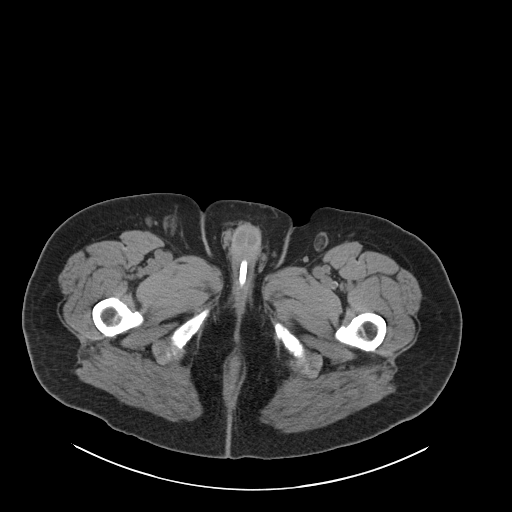
[im 5/101  bone]
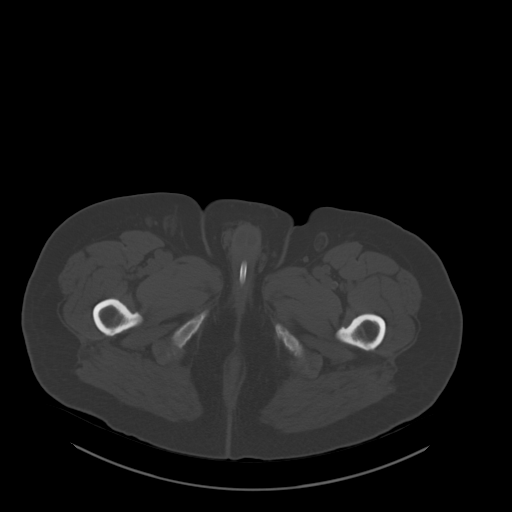
[im 13/101  soft-tissue]
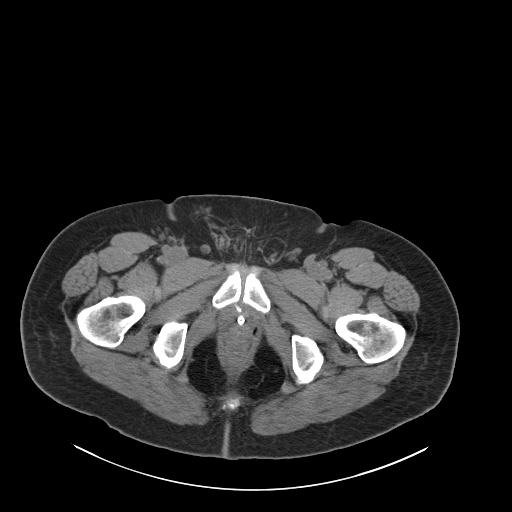
[im 21/101  soft-tissue]
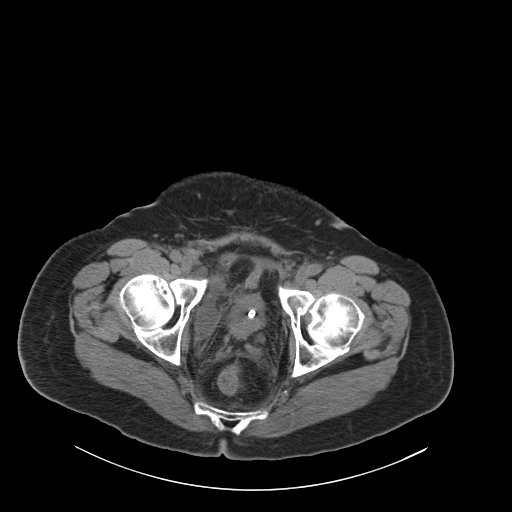
[im 30/101  soft-tissue]
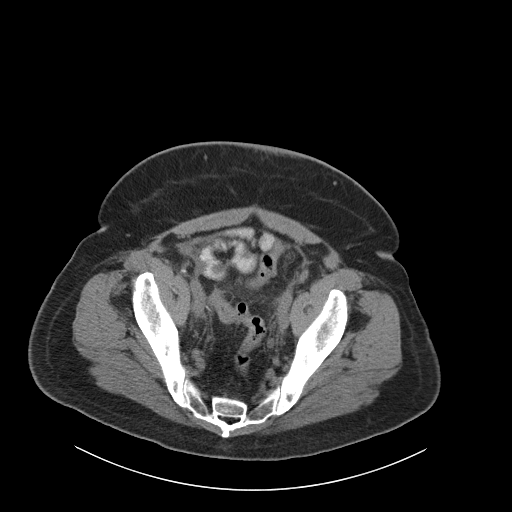
[im 38/101  soft-tissue]
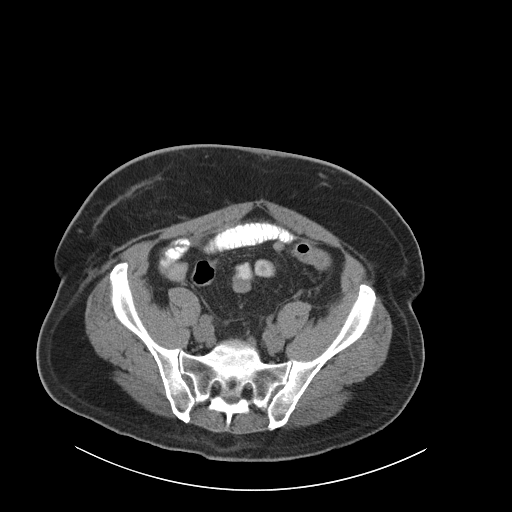
[im 46/101  soft-tissue]
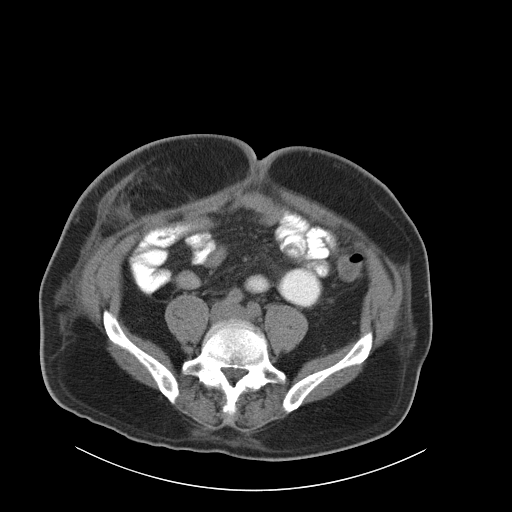
[im 55/101  soft-tissue]
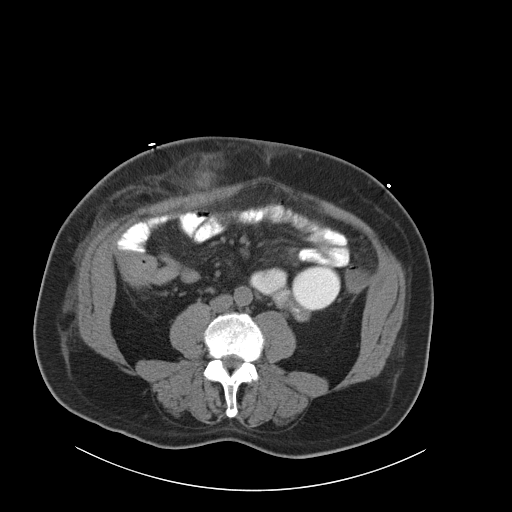
[im 63/101  soft-tissue]
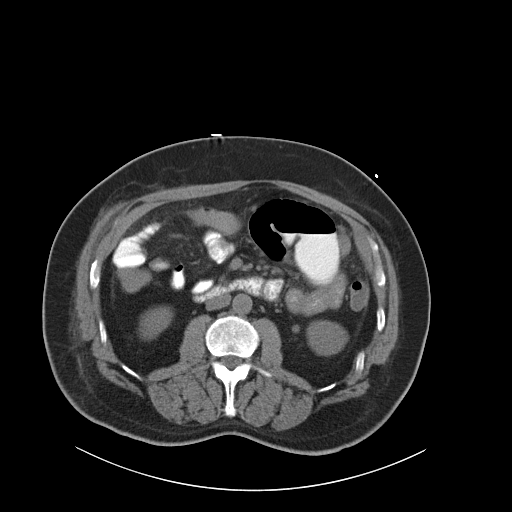
[im 71/101  soft-tissue]
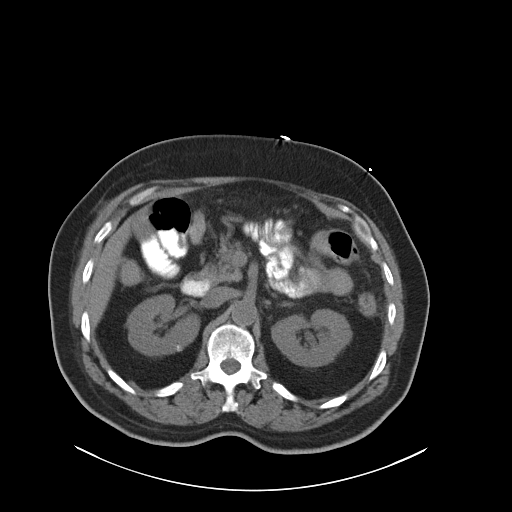
[im 71/101  bone]
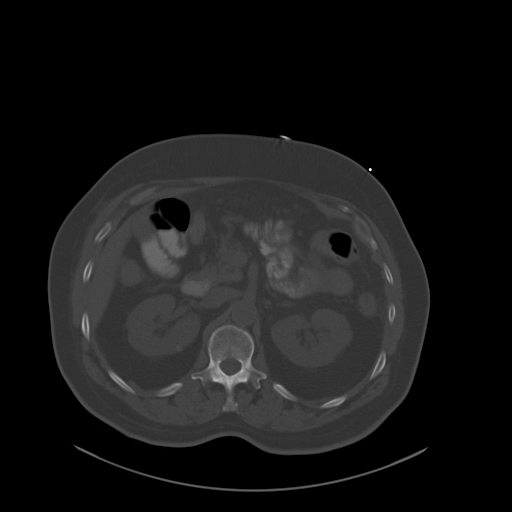
[im 80/101  soft-tissue]
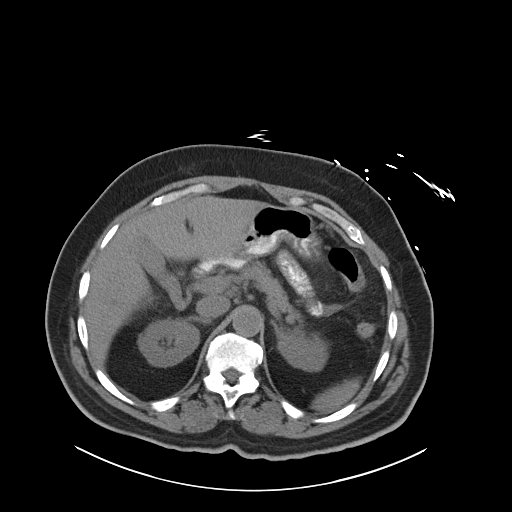
[im 84/101  lung]
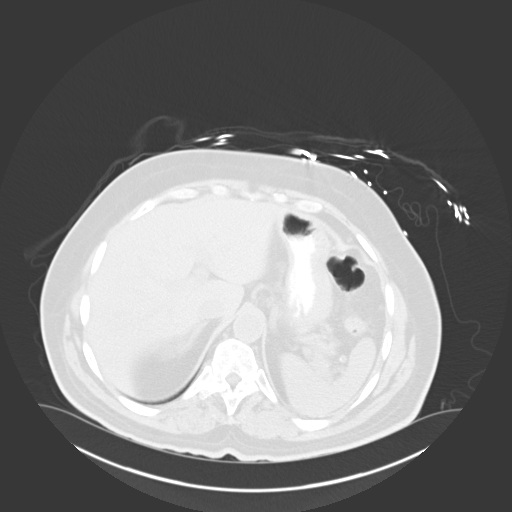
[im 88/101  soft-tissue]
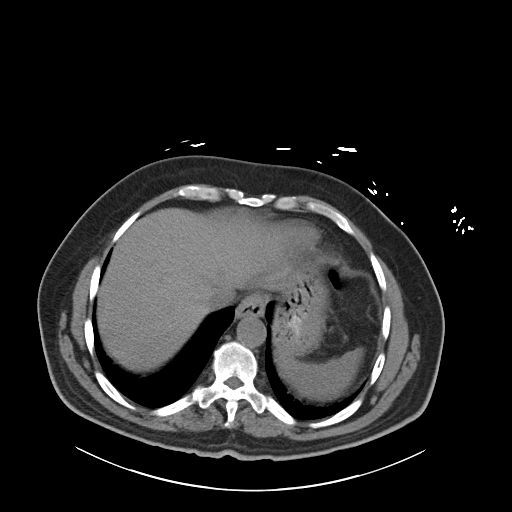
[im 88/101  lung]
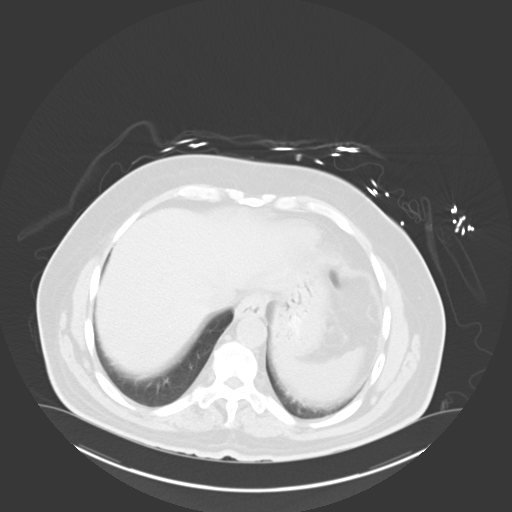
[im 92/101  lung]
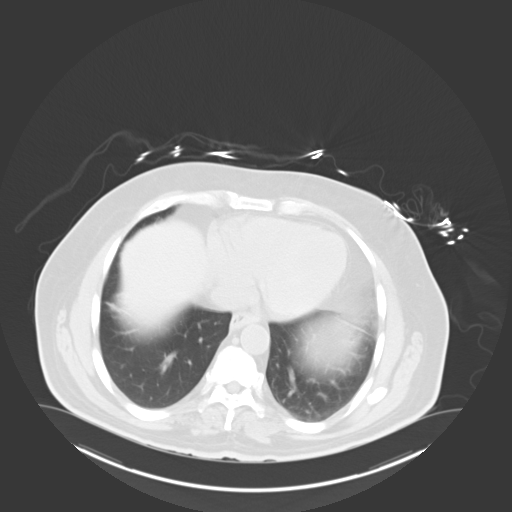
[im 96/101  soft-tissue]
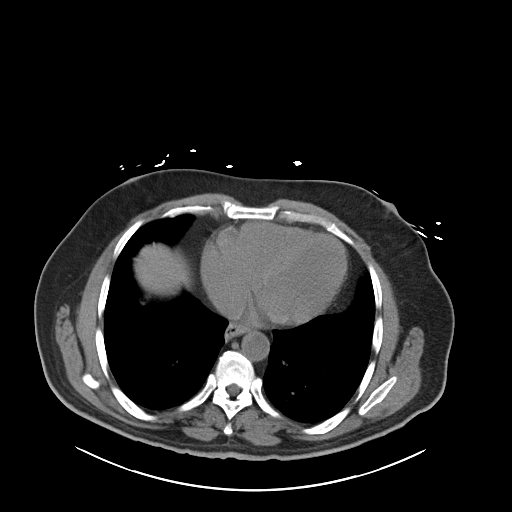
[im 96/101  lung]
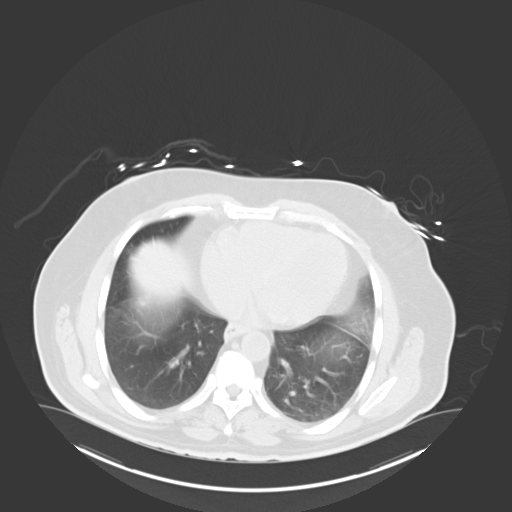

[14 of 32 positions shown; findings below may reference images not displayed]

FINDINGS: There is mild bibasilar atelectatic change.

No focal liver lesions are identified on this noncontrast enhanced
study. Gallbladder wall is not thickened. There may be sludge in the
gallbladder. There is no biliary duct dilatation.

Spleen, pancreas, and adrenals appear normal.

There is a 6 x 4 mm calculus in the periphery of the mid right
kidney. There is a tiny nonobstructing calculus in the upper pole
the right kidney. There is a 1 mm calculus in the mid to lower pole
right kidney. There is no right renal mass or hydronephrosis. There
is no ureteral calculus on the right.

On the left, there is a 1 mm nonobstructing calculus in the medial
upper to midpole region. There is slight hydronephrosis on the left.
There is no left renal mass. There is no ureteral calculus on the
left.

In the pelvis, the urinary bladder is decompressed with a Foley
catheter. Prostate is absent. There is fat in the left inguinal
ring. There is scarring in the right inguinal ring.

There is soft tissue stranding in the right abdominal wall, most
likely due to postoperative change. There is no abdominal wall
abscess or well-defined hematoma. There may be some liquefying
hematoma in the abdominal wall on the right.

There are a few tiny foci of free intraperitoneal air which may be
of postoperative etiology. There is no portal venous air. There is
no bowel obstruction.

There is no appreciable ascites. There is no abscess or adenopathy
in the abdomen or pelvis. Appendix appears normal.

There is no appreciable abdominal aortic aneurysm. There is slight
anterolisthesis of L4 on L5, felt to be due to underlying
spondylosis. No blastic or lytic bone lesions are identified.
IMPRESSION: Areas of postoperative change. Minimal free intraperitoneal air may
be residua of the recent prostatectomy. If patient has clinical
symptoms persist, repeat study to assess for resolution of this
minimal free intraperitoneal air would certainly be warranted. This
patient should be watched closely clinically given this finding and
clinical symptoms.

Mild hydronephrosis on the left without obstructing lesion seen.
This finding could indicate recent calculus passage. This finding
also could be indicative of a degree of pyelonephritis on the left.

No bowel obstruction.  No abscess.  Appendix region appears normal.

Possible sludge in gallbladder. Gallbladder wall not thickened. No
pericholecystic fluid or obvious gallstones by CT.

## 2016-09-03 ENCOUNTER — Telehealth: Payer: Self-pay | Admitting: Surgery

## 2016-09-03 NOTE — Telephone Encounter (Signed)
Patient walked in today requesting an appointment. The patient was seen in 2011 by Dr. Trula Slade. The patient states that he is having circulation issues in his legs and his PCP wants to hospitalize him. The patient is trying not to be hospitalized and would like a second opinion. To help the patient out, I called the patient's PCP, Dr. Doylene Canard, and requested a referral to our office. I told the patient that once their office sends a referral, we'll be happy to see him.

## 2016-09-15 ENCOUNTER — Telehealth (HOSPITAL_COMMUNITY): Payer: Self-pay | Admitting: Surgery

## 2016-09-15 NOTE — Telephone Encounter (Signed)
[name] called to check the status of his referral.  I had not received anything so I called Dr. Doylene Canard for an update.  His office said they are working on the request and will fax this to my attention ASAP.   I called [name] to update him, he said he had an infection in his legs and he is trying to avoid the hospital since he has an outstanding hospital bill.

## 2016-09-26 ENCOUNTER — Other Ambulatory Visit: Payer: Self-pay | Admitting: *Deleted

## 2016-09-26 DIAGNOSIS — R609 Edema, unspecified: Secondary | ICD-10-CM

## 2016-10-08 ENCOUNTER — Encounter: Payer: Self-pay | Admitting: Vascular Surgery

## 2016-10-21 ENCOUNTER — Ambulatory Visit (HOSPITAL_COMMUNITY)
Admission: RE | Admit: 2016-10-21 | Discharge: 2016-10-21 | Disposition: A | Discharge status: Home / Self Care | Payer: Medicare Other | Source: Ambulatory Visit | Attending: Vascular Surgery | Admitting: Vascular Surgery

## 2016-10-21 ENCOUNTER — Encounter: Payer: Self-pay | Admitting: Vascular Surgery

## 2016-10-21 ENCOUNTER — Ambulatory Visit (INDEPENDENT_AMBULATORY_CARE_PROVIDER_SITE_OTHER): Payer: Medicare Other | Admitting: Vascular Surgery

## 2016-10-21 VITALS — BP 143/72 | HR 60 | Temp 96.8°F | Resp 16 | Ht 74.0 in | Wt 252.0 lb

## 2016-10-21 DIAGNOSIS — R609 Edema, unspecified: Secondary | ICD-10-CM

## 2016-10-21 DIAGNOSIS — I83899 Varicose veins of unspecified lower extremities with other complications: Secondary | ICD-10-CM

## 2016-10-21 NOTE — Progress Notes (Signed)
[facility]  Patient name: [name] MRN: [id number] DOB: 1946-05-11 Sex: male  REASON FOR VISIT: lower extremity swelling, requesting physician: Dr. Doylene Canard  HPI: [name] is a 71 y.o. male, who presents with long history of lower extremity swelling. The patient previously underwent saphenous vein stripping on the right leg 40 years ago for a venous stasis ulcer. He has not had reoccurrence of ulceration in that leg, but still has some mild swelling. The patient denies any significant swelling in his left leg until a few years ago after prostate surgery. He states that all the IV fluids he received caused his left leg to swelling. He denies any ulceration to the left leg. He has been wearing bilateral compression stockings for 40 years. He says his legs are much thinner in the morning after he wakes up.  He is on coumadin for history of right leg DVT. He has also been placed on lasix for lower extremity edema. The patient states that this causes his hands to be numb.   The patient is worried about developing infection and wounds of his left leg. His past medical history includes hypertension that is moderately controlled and obesity.   Past Medical History:  Diagnosis Date  . Difficulty sleeping   . DVT (deep venous thrombosis) (HCC)    rt leg 3 yrs ago  . Hypertension   . Leg cramps   . Prostate cancer ([facility])   . Swelling of lower extremity    RT LEG  . Varicose veins    both legs    History reviewed. No pertinent family history.  SOCIAL HISTORY: Social History   Social History  . Marital status: Married    Spouse name: N/A  . Number of children: N/A  . Years of education: N/A   Occupational History  . Not on file.   Social History Main Topics  . Smoking status: Never Smoker  . Smokeless tobacco: Never Used  . Alcohol use No  . Drug use: No  . Sexual activity: No   Other Topics Concern  . Not on file   Social History  Narrative  . No narrative on file    No Known Allergies  MEDICATIONS:  Current Outpatient Prescriptions  Medication Sig Dispense Refill  . amLODipine (NORVASC) 5 MG tablet Take 5 mg by mouth daily.    . ferrous sulfate 325 (65 FE) MG tablet Take 1 tablet (325 mg total) by mouth 3 (three) times daily with meals. 90 tablet 3  . ketorolac (ACULAR) 0.5 % ophthalmic solution Finished.eyes have cleared up.  0  . losartan (COZAAR) 100 MG tablet Take 100 mg by mouth daily.    Marland Kitchen warfarin (COUMADIN) 7.5 MG tablet Take 7.5 mg by mouth daily.     No current facility-administered medications for this visit.     REVIEW OF SYSTEMS:  [X]  denotes positive finding, [ ]  denotes negative finding Cardiac  Comments:  Chest pain or chest pressure:    Shortness of breath upon exertion:    Short of breath when lying flat:    Irregular heart rhythm:        Vascular    Pain in calf, thigh, or hip brought on by ambulation:    Pain in feet at night that wakes you up from your sleep:     Blood clot in your veins:    Leg swelling:  x       Pulmonary    Oxygen at home:  Productive cough:     Wheezing:         Neurologic    Sudden weakness in arms or legs:     Sudden numbness in arms or legs:     Sudden onset of difficulty speaking or slurred speech:    Temporary loss of vision in one eye:     Problems with dizziness:         Gastrointestinal    Blood in stool:     Vomited blood:         Genitourinary    Burning when urinating:     Blood in urine:        Psychiatric    Major depression:         Hematologic    Bleeding problems:    Problems with blood clotting too easily:        Skin    Rashes or ulcers: x       Constitutional    Fever or chills:      PHYSICAL EXAM: Vitals:   10/21/16 1239 10/21/16 1240  BP: (!) 155/77 (!) 143/72  Pulse: 60   Resp: 16   Temp: (!) 96.8 F (36 C)   SpO2: 98%   Weight: 252 lb (114.3 kg)   Height: 6\' 2"  (1.88 m)     GENERAL: The patient is  a well-nourished male, in no acute distress. The vital signs are documented above. HEENT: normocephalic, atraumatic, no abnormalities noted.  CARDIAC: There is a regular rate and rhythm. No carotid bruits.  VASCULAR: 2+ dorsalis pedis pulses bilaterally. 3+ pitting edema left leg. 2+ pitting edema right. Hemosiderin staining bilateral lower extremities. No open wounds seen. Large tributary veins seen on sonosite exam in left thigh.  PULMONARY: There is good air exchange bilaterally without wheezing or rales. MUSCULOSKELETAL: There are no major deformities or cyanosis. NEUROLOGIC: No focal weakness or paresthesias are detected. SKIN: There are no ulcers or rashes noted. PSYCHIATRIC: The patient has a normal affect.  DATA:  Lower extremity venous reflux evaluation 10/21/16  No evidence of DVT of left lower extremity. 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  Reflux noted in left left great saphenous vein at mid and distal thigh. Left GSV is 0.38 cm at knee, 0.58 cm at proximal thigh and 0.67 at [facility]  MEDICAL ISSUES: Varicose veins with complications  The patient has chronic leg swelling of both lower extremities, left worse than right. Has history of right great saphenous vein stripping for right leg ulcer. Has been wearing compression stockings for the past 40 years with minimal improvement in swelling.. Is at high risk for development of venous stasis ulcers of the left leg. Discussed laser ablation of left great saphenous vein to help improve swelling in left leg. Discussed that his swelling will not be completely resolved given deep venous reflux, but may potentially improve some of his swelling. He is agreeable to this. No options for right leg given history of vein stripping. Will need to continue compression stockings on right. He will need to stop his coumadin 4 days prior to his procedure.    Virgina Jock, PA-C [facility]   I  have examined the patient, reviewed and agree with above. Progressive swelling in his left leg. He has warn thigh graduated compression garments for years continues to have difficulty with his usual activities despite this. From explain reasons this is worse since his prostate surgery. I did reimage his left great  saphenous vein with SonoSite ultrasound dystocia dilatation throughout its course with reflux. Discussed options with patient and his wife present. I do feel he can get improvement in his overall left leg venous hypertension with removal of the saphenous vein with laser. Explained the procedure lasting just over an hour as an outpatient under local anesthesia in our office. He wishes to proceed as soon as possible.  Curt Jews, MD 10/21/2016 2:08 PM

## 2016-10-22 ENCOUNTER — Other Ambulatory Visit: Payer: Self-pay | Admitting: *Deleted

## 2016-10-22 DIAGNOSIS — I83812 Varicose veins of left lower extremities with pain: Secondary | ICD-10-CM

## 2016-12-05 ENCOUNTER — Encounter: Payer: Self-pay | Admitting: Vascular Surgery

## 2016-12-08 ENCOUNTER — Encounter: Payer: Self-pay | Admitting: Vascular Surgery

## 2016-12-11 ENCOUNTER — Ambulatory Visit (INDEPENDENT_AMBULATORY_CARE_PROVIDER_SITE_OTHER): Payer: Medicare Other | Admitting: Vascular Surgery

## 2016-12-11 ENCOUNTER — Encounter: Payer: Self-pay | Admitting: Vascular Surgery

## 2016-12-11 VITALS — BP 162/67 | HR 92 | Temp 98.0°F | Resp 16 | Ht 74.0 in | Wt 256.0 lb

## 2016-12-11 DIAGNOSIS — I83899 Varicose veins of unspecified lower extremities with other complications: Secondary | ICD-10-CM | POA: Diagnosis not present

## 2016-12-11 DIAGNOSIS — I868 Varicose veins of other specified sites: Secondary | ICD-10-CM

## 2016-12-11 NOTE — Progress Notes (Signed)
     Laser Ablation Procedure    Date: 12/11/2016   [name] DOB:Dec 04, 1945  Consent signed: Yes    Surgeon:  Dr. Sherren Mocha Ahria Slappey  Procedure: Laser Ablation: left Greater Saphenous Vein  BP (!) 162/67   Pulse 92   Temp 98 F (36.7 C)   Resp 16   Ht 6\' 2"  (1.88 m)   Wt 256 lb (116.1 kg)   SpO2 98%   BMI 32.87 kg/m   Tumescent Anesthesia: 475 cc 0.9% NaCl with 50 cc Lidocaine HCL with 1% Epi and 15 cc 8.4% NaHCO3  Local Anesthesia: 4 cc Lidocaine HCL and NaHCO3 (ratio 2:1)  15 watts continuous mode        Total energy: 3430   Total time: 3:47    Patient tolerated procedure well  Notes:   Description of Procedure:  After marking the course of the secondary varicosities, the patient was placed on the operating table in the supine position, and the left leg was prepped and draped in sterile fashion.   Local anesthetic was administered and under ultrasound guidance the saphenous vein was accessed with a micro needle and guide wire; then the mirco puncture sheath was placed.  A guide wire was inserted saphenofemoral junction , followed by a 5 french sheath.  The position of the sheath and then the laser fiber below the junction was confirmed using the ultrasound.  Tumescent anesthesia was administered along the course of the saphenous vein using ultrasound guidance. The patient was placed in Trendelenburg position and protective laser glasses were placed on patient and staff, and the laser was fired at 15 watts continuous mode advancing 1-43mm/second for a total of 3430 joules.    Steri strips were applied to the stab wounds and ABD pads and thigh high compression stockings were applied.  Ace wrap bandages were applied over the phlebectomy sites and at the top of the saphenofemoral junction. Blood loss was less than 15 cc.  The patient ambulated out of the operating room having tolerated the procedure well.  Uneventful ablation from proximal calf to just below saphenofemoral  junction. Follow-up in one week

## 2016-12-18 ENCOUNTER — Ambulatory Visit (INDEPENDENT_AMBULATORY_CARE_PROVIDER_SITE_OTHER): Payer: Medicare Other | Admitting: Vascular Surgery

## 2016-12-18 ENCOUNTER — Encounter: Payer: Self-pay | Admitting: Vascular Surgery

## 2016-12-18 ENCOUNTER — Ambulatory Visit: Payer: Medicare Other | Admitting: Vascular Surgery

## 2016-12-18 ENCOUNTER — Ambulatory Visit (HOSPITAL_COMMUNITY)
Admission: RE | Admit: 2016-12-18 | Discharge: 2016-12-18 | Disposition: A | Discharge status: Home / Self Care | Payer: Medicare Other | Source: Ambulatory Visit | Attending: Vascular Surgery | Admitting: Vascular Surgery

## 2016-12-18 VITALS — BP 141/68 | HR 68 | Temp 97.1°F | Resp 16 | Ht 74.0 in | Wt 250.0 lb

## 2016-12-18 DIAGNOSIS — I83812 Varicose veins of left lower extremities with pain: Secondary | ICD-10-CM | POA: Diagnosis not present

## 2016-12-18 DIAGNOSIS — I83899 Varicose veins of unspecified lower extremities with other complications: Secondary | ICD-10-CM | POA: Diagnosis not present

## 2016-12-18 NOTE — Progress Notes (Signed)
    Vascular and Vein Specialist of [facility]  Patient name: [name] MRN: [id number] DOB: 1945/10/31 Sex: male  REASON FOR VISIT: One-week follow-up laser ablation left great saphenous vein  HPI: [name] is a 71 y.o. male here today for follow-up. Underwent uneventful laser ablation of his left saphenous vein last week. Has been compliant with his compression initially but now is discontinued this since it was irritating his thigh. He does wear bilateral knee-high compression chronically.  Past Medical History:  Diagnosis Date  . Difficulty sleeping   . DVT (deep venous thrombosis) (HCC)    rt leg 3 yrs ago  . Hypertension   . Leg cramps   . Prostate cancer ([place])   . Swelling of lower extremity    RT LEG  . Varicose veins    both legs    History reviewed. No pertinent family history.  SOCIAL HISTORY: Social History  Substance Use Topics  . Smoking status: Never Smoker  . Smokeless tobacco: Never Used  . Alcohol use No    No Known Allergies  Current Outpatient Prescriptions  Medication Sig Dispense Refill  . amLODipine (NORVASC) 5 MG tablet Take 5 mg by mouth daily.    . ferrous sulfate 325 (65 FE) MG tablet Take 1 tablet (325 mg total) by mouth 3 (three) times daily with meals. 90 tablet 3  . ketorolac (ACULAR) 0.5 % ophthalmic solution Finished.eyes have cleared up.  0  . losartan (COZAAR) 100 MG tablet Take 100 mg by mouth daily.    Marland Kitchen warfarin (COUMADIN) 7.5 MG tablet Take 7.5 mg by mouth daily.     No current facility-administered medications for this visit.     REVIEW OF SYSTEMS:  [X]  denotes positive finding, [ ]  denotes negative finding Cardiac  Comments:  Chest pain or chest pressure:    Shortness of breath upon exertion:    Short of breath when lying flat:    Irregular heart rhythm:        Vascular    Pain in calf, thigh, or hip brought on by ambulation:    Pain in feet at night that wakes you up from  your sleep:     Blood clot in your veins:    Leg swelling:  x         PHYSICAL EXAM: Vitals:   12/18/16 1012  BP: (!) 141/68  Pulse: 68  Resp: 16  Temp: (!) 97.1 F (36.2 C)  SpO2: 100%  Weight: 250 lb (113.4 kg)  Height: 6\' 2"  (1.88 m)    GENERAL: The patient is a well-nourished male, in no acute distress. The vital signs are documented above. CARDIOVASCULAR: Palpable radial pulses. Easily palpable thrombosed left great saphenous vein from knee towards groin. PULMONARY: There is good air exchange  MUSCULOSKELETAL: There are no major deformities or cyanosis. NEUROLOGIC: No focal weakness or paresthesias are detected. SKIN: There are no ulcers or rashes noted. PSYCHIATRIC: The patient has a normal affect.  DATA:  Duplex shows closure of his great saphenous vein from the knee to 3 cm below the saphenofemoral junction and no DVT  MEDICAL ISSUES: Stable postop 1 week from laser ablation great saphenous vein. We'll continue to wear his knee-high compression bilaterally. Does have chronic swelling bilaterally and explained that the superficial component has been treated. Will see [place] on an as-needed basis   [name] Posner, MD [facility] of [facility] Tel [phone] Pager [phone]

## 2017-07-14 ENCOUNTER — Other Ambulatory Visit: Payer: Self-pay

## 2017-07-14 DIAGNOSIS — I872 Venous insufficiency (chronic) (peripheral): Secondary | ICD-10-CM

## 2017-08-17 ENCOUNTER — Ambulatory Visit (HOSPITAL_COMMUNITY)
Admission: RE | Admit: 2017-08-17 | Discharge: 2017-08-17 | Disposition: A | Discharge status: Home / Self Care | Payer: Medicare Other | Source: Ambulatory Visit | Attending: Surgery | Admitting: Surgery

## 2017-08-17 DIAGNOSIS — I872 Venous insufficiency (chronic) (peripheral): Secondary | ICD-10-CM | POA: Diagnosis present

## 2017-08-18 ENCOUNTER — Encounter: Payer: Self-pay | Admitting: Vascular Surgery

## 2017-08-18 ENCOUNTER — Ambulatory Visit (INDEPENDENT_AMBULATORY_CARE_PROVIDER_SITE_OTHER): Payer: Medicare Other | Admitting: Vascular Surgery

## 2017-08-18 VITALS — BP 158/73 | HR 107 | Temp 98.0°F | Resp 18 | Ht 74.0 in | Wt 251.0 lb

## 2017-08-18 DIAGNOSIS — I890 Lymphedema, not elsewhere classified: Secondary | ICD-10-CM | POA: Diagnosis not present

## 2017-08-18 NOTE — Progress Notes (Signed)
HISTORY AND PHYSICAL     CC:  Leg swelling Requesting Provider:  Dixie Dials, MD  HPI: This is a [name].  He has a hx of saphenous vein stripping in the right leg more than 40 years ago.  He also has a hx of DVT in the right leg and is on coumadin.  He has a hx of robot assisted prostatectomy with bilateral pelvic lymphadenectomy in 2015 by Dr. Tresa Moore and his leg swelling worsened after that.  Dr. Donnetta Hutching saw the pt last year and he had laser ablation of the left great saphenous vein in July 2018.  He states that he still has leg swelling on the left but is better.  He does wear compression daily.  He has not worn the compression in a couple of days and has more swelling today.  He states that sometimes he feels bad and it improves in a couple of days.   He states he saw his PCP and was placed on medicine for gout in the right leg, which did not help.  He is on lasix.  He is on an ARB for blood pressure management.   Past Medical History:  Diagnosis Date  . Difficulty sleeping   . DVT (deep venous thrombosis) (HCC)    rt leg 3 yrs ago  . Hypertension   . Leg cramps   . Prostate cancer ([place])   . Swelling of lower extremity    RT LEG  . Varicose veins    both legs    Past Surgical History:  Procedure Laterality Date  . HERNIA REPAIR  2012   INGUINAL  . LYMPHADENECTOMY Bilateral 12/30/2013   Procedure: PELVIC LYMPH NODE DISSECTION;  Surgeon: Alexis Frock, MD;  Location: [facility];  Service: Urology;  Laterality: Bilateral;  . NASAL SINUS SURGERY    . ROBOT ASSISTED LAPAROSCOPIC RADICAL PROSTATECTOMY N/A 12/30/2013   Procedure: ROBOTIC ASSISTED LAPAROSCOPIC RADICAL PROSTATECTOMY WITH INDOCYANINE GREEN DYE;  Surgeon: Alexis Frock, MD;  Location: [facility];  Service: Urology;  Laterality: N/A;  . VEIN SURGERY     RT LEG    No Known Allergies  Current Outpatient Medications  Medication Sig Dispense Refill  .  allopurinol (ZYLOPRIM) 100 MG tablet Take 100 mg by mouth daily.    . colchicine 0.6 MG tablet Take 0.6 mg by mouth daily.    . furosemide (LASIX) 40 MG tablet Take 40 mg by mouth.    . losartan (COZAAR) 100 MG tablet Take 100 mg by mouth daily.    Marland Kitchen warfarin (COUMADIN) 7.5 MG tablet Take 7.5 mg by mouth daily.    Marland Kitchen amLODipine (NORVASC) 5 MG tablet Take 5 mg by mouth daily.    . ferrous sulfate 325 (65 FE) MG tablet Take 1 tablet (325 mg total) by mouth 3 (three) times daily with meals. (Patient not taking: Reported on 08/18/2017) 90 tablet 3  . ketorolac (ACULAR) 0.5 % ophthalmic solution Finished.eyes have cleared up.  0   No current facility-administered medications for this visit.     Family Hx:  No cardiac hx or AAA hx in parents prior to the age of 50  Social History   Socioeconomic History  . Marital status: Married    Spouse name: Not on file  . Number of children: Not on file  . Years of education: Not on file  . Highest education level: Not on file  Occupational History  . Not on file  Social Needs  . Financial resource strain: Not on file  . Food insecurity:    Worry: Not on file    Inability: Not on file  . Transportation needs:    Medical: Not on file    Non-medical: Not on file  Tobacco Use  . Smoking status: Never Smoker  . Smokeless tobacco: Never Used  Substance and Sexual Activity  . Alcohol use: No  . Drug use: No  . Sexual activity: Never  Lifestyle  . Physical activity:    Days per week: Not on file    Minutes per session: Not on file  . Stress: Not on file  Relationships  . Social connections:    Talks on phone: Not on file    Gets together: Not on file    Attends religious service: Not on file    Active member of club or organization: Not on file    Attends meetings of clubs or organizations: Not on file    Relationship status: Not on file  . Intimate partner violence:    Fear of current or ex partner: Not on file    Emotionally abused: Not  on file    Physically abused: Not on file    Forced sexual activity: Not on file  Other Topics Concern  . Not on file  Social History Narrative  . Not on file     REVIEW OF SYSTEMS:   [X]  denotes positive finding, [ ]  denotes negative finding Cardiac  Comments:  Chest pain or chest pressure:    Shortness of breath upon exertion:    Short of breath when lying flat:    Irregular heart rhythm:        Vascular    Pain in calf, thigh, or hip brought on by ambulation: x   Pain in feet at night that wakes you up from your sleep:     Blood clot in your veins:    Leg swelling:  x       Pulmonary    Oxygen at home:    Productive cough:     Wheezing:         Neurologic    Sudden weakness in arms or legs:     Sudden numbness in arms or legs:     Sudden onset of difficulty speaking or slurred speech:    Temporary loss of vision in one eye:     Problems with dizziness:         Gastrointestinal    Blood in stool:     Vomited blood:         Genitourinary    Burning when urinating:     Blood in urine:        Psychiatric    Major depression:         Hematologic    Bleeding problems:    Problems with blood clotting too easily:        Skin    Rashes or ulcers: x       Constitutional    Fever or chills: x     PHYSICAL EXAMINATION:  Vitals:   08/18/17 1507 08/18/17 1512  BP: (!) 169/79 (!) 158/73  Pulse: (!) 107   Resp: 18   Temp: 98 F (36.7 C)   SpO2: 99%    Vitals:   08/18/17 1507  Weight: 251 lb (113.9 kg)  Height: 6\' 2"  (1.88 m)   Body mass index is 32.23  kg/m.  General:  WDWN in NAD; vital signs documented above Gait: Not observed HENT: WNL, normocephalic Pulmonary: normal non-labored breathing , without Rales, rhonchi,  wheezing Cardiac: regular HR, without  Murmurs without carotid bruits Skin: without rashes Vascular Exam/Pulses:  Right Left  Radial 2+ (normal) 2+ (normal)  Ulnar 2+ (normal) 2+ (normal)   Extremities:  Bilateral feet are  warm. Right leg   Left leg    Musculoskeletal: no muscle wasting or atrophy  Neurologic: A&O X 3;  No focal weakness or paresthesias are detected Psychiatric:  The pt has Normal affect.   Non-Invasive Vascular Imaging:   Venous duplex 08/17/17 Reviewed by Dr. Donnetta Hutching  Pt meds includes: Statin:  No. Beta Blocker:  No. Aspirin:  No. ACEI:  No. ARB:  Yes.   CCB use:  No Other Antiplatelet/Anticoagulant:  Yes Coumadin   ASSESSMENT/PLAN:: 72 y.o. male with chronic leg swelling with hx of robotic assisted prostatectomy and lymphadnectomy in 2015   -pt continues to have BLE leg swelling that is consistent with lymphedema given his hx of lymph node dissection with his prostatectomy.  He is at risk for lymphangitis, which Dr. Donnetta Hutching discussed with the pt and his wife.  Should he start to feel ill and his leg starts to become red, he will need to be evaluated for this.  He has hx of right GSV stripping > 40 years ago and ablation of his left GSV last year.  He will need to continue compression and elevation when possible.    Leontine Locket, PA-C [facility] [phone]  Clinic MD:  Pt seen and examined with Dr. Donnetta Hutching  I have examined the patient, reviewed and agree with above.  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  Does not have any evidence of venous hypertension that is correctable.  He is having swelling into his ankles foot and also onto his toes with classic appearance of lymphedema.  I discussed this at length with the patient and his wife present.  Explained the only treatment options are elevation and compression.  Suspect that this is related to his lymphadenectomy required from his radical prostatectomy in 2015. Curt Jews, MD 08/18/2017 4:28 PM

## 2018-10-21 ENCOUNTER — Other Ambulatory Visit: Payer: Self-pay | Admitting: Cardiovascular Disease

## 2018-10-21 ENCOUNTER — Ambulatory Visit
Admission: RE | Admit: 2018-10-21 | Discharge: 2018-10-21 | Disposition: A | Discharge status: Home / Self Care | Payer: Medicare Other | Source: Ambulatory Visit | Attending: Cardiovascular Disease | Admitting: Cardiovascular Disease

## 2018-10-21 DIAGNOSIS — R079 Chest pain, unspecified: Secondary | ICD-10-CM

## 2018-10-21 DIAGNOSIS — M549 Dorsalgia, unspecified: Secondary | ICD-10-CM

## 2018-12-27 ENCOUNTER — Other Ambulatory Visit: Payer: Self-pay | Admitting: Cardiovascular Disease

## 2019-03-08 ENCOUNTER — Ambulatory Visit (INDEPENDENT_AMBULATORY_CARE_PROVIDER_SITE_OTHER): Payer: Medicare Other | Admitting: Podiatry

## 2019-03-08 ENCOUNTER — Other Ambulatory Visit: Payer: Self-pay

## 2019-03-08 ENCOUNTER — Encounter: Payer: Self-pay | Admitting: Podiatry

## 2019-03-08 VITALS — BP 181/89 | HR 81 | Resp 16

## 2019-03-08 DIAGNOSIS — I872 Venous insufficiency (chronic) (peripheral): Secondary | ICD-10-CM | POA: Diagnosis not present

## 2019-03-08 DIAGNOSIS — Z7901 Long term (current) use of anticoagulants: Secondary | ICD-10-CM

## 2019-03-08 DIAGNOSIS — L84 Corns and callosities: Secondary | ICD-10-CM | POA: Diagnosis not present

## 2019-03-08 DIAGNOSIS — B351 Tinea unguium: Secondary | ICD-10-CM

## 2019-03-08 DIAGNOSIS — M7989 Other specified soft tissue disorders: Secondary | ICD-10-CM

## 2019-03-08 MED ORDER — AMMONIUM LACTATE 12 % EX CREA: TOPICAL_CREAM | CUTANEOUS | 0 refills | Status: DC | PRN

## 2019-03-08 MED ORDER — TRIAMCINOLONE ACETONIDE 0.1 % EX CREA: 1.0000 "application " | TOPICAL_CREAM | Freq: Two times a day (BID) | CUTANEOUS | 0 refills | Status: DC

## 2019-03-08 NOTE — Progress Notes (Signed)
Subjective:   Patient ID: [name], male   DOB: 73 y.o.   MRN: [id number]   HPI 73 year old male presents the office today for concerns of calluses to his right foot as well as her chronic swelling to his legs.  He does wear compression socks these new ones.  He states he will occasionally ulcer on both legs on the right side as well as the left.  Have an elevator accident 49s.  Compression socks to help.  He has been using AMD ointment on his toes.  He used to use hydrocortisone cream to his legs which was helpful.  He is asking for steroid medication use for his legs.  Also asking for his nails be trimmed nails are thick and elongated.   Review of Systems  All other systems reviewed and are negative.  Past Medical History:  Diagnosis Date  . Difficulty sleeping   . DVT (deep venous thrombosis) (HCC)    rt leg 3 yrs ago  . Hypertension   . Leg cramps   . Prostate cancer ([place])   . Swelling of lower extremity    RT LEG  . Varicose veins    both legs    Past Surgical History:  Procedure Laterality Date  . HERNIA REPAIR  2012   INGUINAL  . LYMPHADENECTOMY Bilateral 12/30/2013   Procedure: PELVIC LYMPH NODE DISSECTION;  Surgeon: Alexis Frock, MD;  Location: [facility];  Service: Urology;  Laterality: Bilateral;  . NASAL SINUS SURGERY    . ROBOT ASSISTED LAPAROSCOPIC RADICAL PROSTATECTOMY N/A 12/30/2013   Procedure: ROBOTIC ASSISTED LAPAROSCOPIC RADICAL PROSTATECTOMY WITH INDOCYANINE GREEN DYE;  Surgeon: Alexis Frock, MD;  Location: [facility];  Service: Urology;  Laterality: N/A;  . VEIN SURGERY     RT LEG     Current Outpatient Medications:  .  metoprolol tartrate (LOPRESSOR) 25 MG tablet, Take by mouth., Disp: , Rfl:  .  OLMESARTAN MEDOXOMIL PO, Take by mouth., Disp: , Rfl:  .  allopurinol (ZYLOPRIM) 100 MG tablet, Take 100 mg by mouth daily., Disp: , Rfl:  .  amLODipine (NORVASC) 5 MG tablet, Take 5 mg by mouth daily., Disp: , Rfl:  .  ammonium lactate (AMLACTIN) 12 %  cream, Apply topically as needed for dry skin., Disp: 385 g, Rfl: 0 .  colchicine 0.6 MG tablet, Take 0.6 mg by mouth daily., Disp: , Rfl:  .  ferrous sulfate 325 (65 FE) MG tablet, Take 1 tablet (325 mg total) by mouth 3 (three) times daily with meals. (Patient not taking: Reported on 08/18/2017), Disp: 90 tablet, Rfl: 3 .  furosemide (LASIX) 40 MG tablet, Take 40 mg by mouth., Disp: , Rfl:  .  hydrALAZINE (APRESOLINE) 25 MG tablet, TAKE 1 TABLET BY MOUTH TWICE A DAY FOR HEART, Disp: , Rfl:  .  losartan (COZAAR) 100 MG tablet, Take 100 mg by mouth daily., Disp: , Rfl:  .  torsemide (DEMADEX) 20 MG tablet, Take 20 mg by mouth daily., Disp: , Rfl:  .  traMADol (ULTRAM) 50 MG tablet, TAKE 1 TABLET BY MOUTH TWICE A DAY FOR PAIN, Disp: , Rfl:  .  triamcinolone cream (KENALOG) 0.1 %, Apply 1 application topically 2 (two) times daily., Disp: 30 g, Rfl: 0 .  warfarin (COUMADIN) 7.5 MG tablet, Take 7.5 mg by mouth daily., Disp: , Rfl:   Allergies  Allergen Reactions  . Cephalexin     patient only took one pill and stated that it made him vomit  . Mometasone  patient states the cream made sores on his leg         Objective:  Physical Exam  General: AAO x3, NAD  Dermatological: Nails are hypertrophic, dystrophic, discolored with yellow-brown discoloration.  Hyperkeratotic lesion right distal third toe, right medial first MPJ.  No underlying ulceration or any signs of infection noted.  Small wound present on the medial aspect of the left lower leg.  Identifies no probing, undermining.  Stasis dermatitis present of the right leg worse than left  Vascular: DP pulses 2/4, PT pulses decreased with there is chronic swelling.  Lymphedema changes present to bilateral lower extremities.  Neruologic: Grossly intact via light touch bilateral.  Musculoskeletal: Hammertoes present.  Gait: Nonantalgic.      Assessment:   Preulcerative calluses, onychomycosis currently on Coumadin; bilateral lower  extremity swelling with lymphedema changes     Plan:   -Treatment options discussed including all alternatives, risks, and complications -Etiology of symptoms were discussed -Debride the nails x10 as well as hyperkeratotic lesions x2 without any complications or bleeding -Prescribed triamcinolone cream for the stasis dermatitis -Prescribed AmLactin for dry skin.  Do not apply interdigitally.  Use antibiotic ointment on the wound as well as compression.  Information was given for new compression socks.  Encouraged elevation.  Return in about 9 weeks (around 05/10/2019).  Trula Slade DPM

## 2019-05-10 ENCOUNTER — Other Ambulatory Visit: Payer: Self-pay

## 2019-05-10 ENCOUNTER — Ambulatory Visit (INDEPENDENT_AMBULATORY_CARE_PROVIDER_SITE_OTHER): Payer: Medicare Other | Admitting: Podiatry

## 2019-05-10 DIAGNOSIS — Z7901 Long term (current) use of anticoagulants: Secondary | ICD-10-CM

## 2019-05-10 DIAGNOSIS — B351 Tinea unguium: Secondary | ICD-10-CM

## 2019-05-10 DIAGNOSIS — L84 Corns and callosities: Secondary | ICD-10-CM | POA: Diagnosis not present

## 2019-05-10 MED ORDER — CICLOPIROX 8 % EX SOLN: Freq: Every day | CUTANEOUS | 4 refills | Status: DC

## 2019-05-11 ENCOUNTER — Telehealth: Payer: Self-pay | Admitting: *Deleted

## 2019-05-11 DIAGNOSIS — M7989 Other specified soft tissue disorders: Secondary | ICD-10-CM

## 2019-05-11 DIAGNOSIS — I872 Venous insufficiency (chronic) (peripheral): Secondary | ICD-10-CM

## 2019-05-11 NOTE — Telephone Encounter (Signed)
Faxed referral to [facility] - Lymphedema with available clinicals and demographics.

## 2019-05-11 NOTE — Telephone Encounter (Signed)
-----   Message from Trula Slade, DPM sent at 05/10/2019  5:35 PM EST ----- Can you please put in a referral for lymphedema clinic? Thanks.

## 2019-05-19 NOTE — Progress Notes (Signed)
Subjective: 73 y.o. returns the office today for painful, elongated, thickened toenails which he cannot trim himself. Denies any redness or drainage around the nails.  He also has calluses to his feet a lot to have trimmed.  He also gets still quite a bit of swelling to both of his legs he is asking if there is any further treatment that we can do.  Denies any acute changes since last appointment and no new complaints today. Denies any systemic complaints such as fevers, chills, nausea, vomiting.   PCP: Dixie Dials, MD  Objective: AAO 3, NAD DP/PT pulses palpable, CRT less than 3 seconds Nails hypertrophic, dystrophic, elongated, brittle, discolored 10. There is tenderness overlying the nails 1-5 bilaterally. There is no surrounding erythema or drainage along the nail sites. Hyperkeratotic lesions present to the distal aspects of the third toes as well as the right first MPJ medially.  No underlying ulceration drainage or any signs of infection. Chronic swelling, lymphedema changes present with skin changes consistent with this.  No skin breakdown.  No erythema or warmth or any signs of infection. No open lesions or pre-ulcerative lesions are identified. No other areas of tenderness bilateral lower extremities. No overlying edema, erythema, increased warmth. No pain with calf compression, swelling, warmth, erythema.  Assessment: Patient presents with symptomatic onychomycosis, hyperkeratotic lesions, lymphedema  Plan: -Treatment options including alternatives, risks, complications were discussed -Nails sharply debrided 10 without complication/bleeding. -Hyperkeratotic lesion sharp debrided any complications or bleeding. -He was asked for other treatment options for the swelling to his legs.  Will consider order a consult for lymphedema clinic to see if there is any therapy that they can help [place] with. -Discussed daily foot inspection. If there are any changes, to call the office  immediately.  -Follow-up in 3 months or sooner if any problems are to arise. In the meantime, encouraged to call the office with any questions, concerns, changes symptoms.  Celesta Gentile, DPM

## 2019-05-30 ENCOUNTER — Telehealth (HOSPITAL_COMMUNITY): Payer: Self-pay | Admitting: Physical Therapy

## 2019-05-30 NOTE — Telephone Encounter (Signed)
S/w with caregiver- she advised to call back and l/m to cx 05/31/2019 and give new scheduled date of 06/14/2019 to start Lymphedema eval and tx.

## 2019-05-31 ENCOUNTER — Ambulatory Visit (HOSPITAL_COMMUNITY): Payer: Medicare Other | Admitting: Physical Therapy

## 2019-06-14 ENCOUNTER — Ambulatory Visit (HOSPITAL_COMMUNITY): Payer: Medicare Other | Admitting: Physical Therapy

## 2019-06-21 ENCOUNTER — Other Ambulatory Visit: Payer: Self-pay

## 2019-06-21 ENCOUNTER — Encounter (HOSPITAL_COMMUNITY): Payer: Self-pay | Admitting: Physical Therapy

## 2019-06-21 ENCOUNTER — Ambulatory Visit (HOSPITAL_COMMUNITY): Payer: Medicare Other | Attending: Podiatry | Admitting: Physical Therapy

## 2019-06-21 DIAGNOSIS — S81801A Unspecified open wound, right lower leg, initial encounter: Secondary | ICD-10-CM | POA: Insufficient documentation

## 2019-06-21 DIAGNOSIS — I890 Lymphedema, not elsewhere classified: Secondary | ICD-10-CM

## 2019-06-21 DIAGNOSIS — R262 Difficulty in walking, not elsewhere classified: Secondary | ICD-10-CM | POA: Diagnosis present

## 2019-06-21 NOTE — Therapy (Signed)
[place], [name], [address] Phone: [phone]   Fax:  [phone]  Physical Therapy Evaluation  Patient Details  Name: [name] MRN: [id number] Date of Birth: Oct 05, 1945 Referring Provider (PT): Celesta Gentile   Encounter Date: 06/21/2019  PT End of Session - 06/21/19 1315    Visit Number  1    Number of Visits  18    Date for PT Re-Evaluation  08/02/19    Authorization Type  BC[employer] medicate cert good until 0000000    Authorization - Visit Number  1    Authorization - Number of Visits  18    PT Start Time  0850    PT Stop Time  1000    PT Time Calculation (min)  70 min    Activity Tolerance  Patient tolerated treatment well    Behavior During Therapy  [facility] for tasks assessed/performed       Past Medical History:  Diagnosis Date  . Difficulty sleeping   . DVT (deep venous thrombosis) (HCC)    rt leg 3 yrs ago  . Hypertension   . Leg cramps   . Prostate cancer ([place])   . Swelling of lower extremity    RT LEG  . Varicose veins    both legs    Past Surgical History:  Procedure Laterality Date  . HERNIA REPAIR  2012   INGUINAL  . LYMPHADENECTOMY Bilateral 12/30/2013   Procedure: PELVIC LYMPH NODE DISSECTION;  Surgeon: Alexis Frock, MD;  Location: [facility];  Service: Urology;  Laterality: Bilateral;  . NASAL SINUS SURGERY    . ROBOT ASSISTED LAPAROSCOPIC RADICAL PROSTATECTOMY N/A 12/30/2013   Procedure: ROBOTIC ASSISTED LAPAROSCOPIC RADICAL PROSTATECTOMY WITH INDOCYANINE GREEN DYE;  Surgeon: Alexis Frock, MD;  Location: [facility];  Service: Urology;  Laterality: N/A;  . VEIN SURGERY     RT LEG    There were no vitals filed for this visit.   Subjective Assessment - 06/21/19 0850    Subjective  Mr. Dyck states that her has had chronic venous insufficiency for years.  About 20 years ago he noted increased swelling in both of his legs.  He has had to be on antibiotics approximately 4 times in the  past two years.  He currently  has a wound  on his RT LE.  He has been placed on diuretics for the swelling in his legs  but it does not help.  He states that it is difficult for him to walk because his legs are so heavy.  He has finally been referred to skilled physical therapy for his lymphedema.    Pertinent History  bulge disc in his back,  hx of DVT in RT LE, HTN, prostate cancer, CVI, lymphedema    Limitations  Walking;House hold activities    How long can you sit comfortably?  no problem    How long can you walk comfortably?  5 min    Patient Stated Goals  To get the fluid out of his legs    Currently in Pain?  No/denies         [facility] PT Assessment - 06/21/19 0001      Assessment   Medical Diagnosis  B LE lymphedema    Referring Provider (PT)  Celesta Gentile    Onset Date/Surgical Date  05/20/09    Next MD Visit  08/01/2019    Prior Therapy  none      Precautions   Precautions  --  cellulitis      Restrictions   Weight Bearing Restrictions  No      Balance Screen   Has the patient fallen in the past 6 months  No    Has the patient had a decrease in activity level because of a fear of falling?   Yes    Is the patient reluctant to leave their home because of a fear of falling?   No      Home Film/video editor residence    Home Access  Stairs to enter    Entrance Stairs-Number of Steps  3    [place]  Two level    Alternate Level Stairs-Number of Steps  10    Additional Comments  holds onto bannister and goes slowly       Cognition   Overall Cognitive Status  Within Functional Limits for tasks assessed      Observation/Other Assessments   Other Surveys   --   Life impact 52       LYMPHEDEMA/ONCOLOGY QUESTIONNAIRE - 06/21/19 0907      What other symptoms do you have   Are you Having Heaviness or Tightness  Yes    Are you having Pain  No    Are you having pitting edema  Yes    Is it Hard or Difficult finding clothes that fit  Yes     Do you have infections  Yes    Stemmer Sign  Yes      Lymphedema Stage   Stage  STAGE 2 SPONTANEOUSLY IRREVERSIBLE      Lymphedema Assessments   Lymphedema Assessments  Lower extremities      Right Lower Extremity Lymphedema   20 cm Proximal to Suprapatella  69.8 cm    10 cm Proximal to Suprapatella  67 cm    At Midpatella/Popliteal Crease  58 cm    30 cm Proximal to Floor at Lateral Plantar Foot  51.8 cm    20 cm Proximal to Floor at Lateral Plantar Foot  45.5 1    10  cm Proximal to Floor at Lateral Malleoli  38.8 cm    Circumference of ankle/heel  41.8 cm.    5 cm Proximal to 1st MTP Joint  28.3 cm    Across MTP Joint  28.3 cm    Around Proximal Great Toe  13.7 cm      Left Lower Extremity Lymphedema   20 cm Proximal to Suprapatella  64 cm    10 cm Proximal to Suprapatella  58.5 cm    At Midpatella/Popliteal Crease  52.7 cm    30 cm Proximal to Floor at Lateral Plantar Foot  50.4 cm    20 cm Proximal to Floor at Lateral Plantar Foot  46.3 cm    10 cm Proximal to Floor at Lateral Malleoli  39 cm    Circumference of ankle/heel  41.3 cm.    5 cm Proximal to 1st MTP Joint  27.8 cm    Across MTP Joint  29.3 cm    Around Proximal Great Toe  11.1 cm             Objective measurements completed on examination: See above findings.      [place] Adult PT Treatment/Exercise - 06/21/19 0001      Exercises   Exercises  --   ankle pump, LAQ, ab/adduction, marching + diaphragmic breath            PT Education -  06/21/19 1308    Education Details  LEexercises to promote lymphatic circulation, What lymphedema is, what treatment consists of and how it is controlled.    Person(s) Educated  Patient    Methods  Explanation    Comprehension  Verbalized understanding       PT Short Term Goals - 06/21/19 1330      PT SHORT TERM GOAL #1   Title  Pt to be I in HEP to improve lymphatic circulation to assist in decreasing volumes.    Time  3    Period  Weeks    Status   New    Target Date  07/12/19      PT SHORT TERM GOAL #2   Title  Pt to have lost 5 cm volume from RT LE , 3 from Lt to allow pt to be able to fit in more of his shoes.    Time  3    Period  Weeks        PT Long Term Goals - 06/21/19 1332      PT LONG TERM GOAL #1   Title  Pt Wound on Rt LE to be healed    Time  6    Period  Weeks    Status  New    Target Date  08/02/19      PT LONG TERM GOAL #2   Title  PT to have lost 8 cm volume from RT LE, 4 from Lt to allow pt  to be able to walk for 20 minutes at a time without difficulty.    Time  6    Period  Weeks    Status  New      PT LONG TERM GOAL #3   Title  PT to have and be using a compression pump; order faxed to MD    Time  6    Period  Weeks    Status  New             Plan - 06/21/19 1318    Clinical Impression Statement  Mr. Mcclymont is a 74 yo male who has a hx of chronic venous statsis, he states that he has noticed that his legs have been having increased swelling for the past ten years.  He has had multiple wounds, approximately 4 in the past two years requiring antibiotics and currently has one now.  He has been placed on diuretics for his swelling with no improvement. He currently has been referred to skilled physical therapy for lymphedema treatment.  Evaluation demonstrates significant induration of the Lt LE and marked induration of the Rt LE.   The Rt LE also has a wound on the medial aspect.  Mr. Carrino does wear knee high compression on a daily basis but does not have a compression pump.  Mr Vardeman will benefit from skilled physical therapy to decrease the volume in his Legs to improve his mobility and allow him to be able to don his shoes, educate him on the maintenance phase of lymphedema and improve his skin integrity.    Personal Factors and Comorbidities  Time since onset of injury/illness/exacerbation    Examination-Activity Limitations  Bathing;Carry;Dressing;Lift;Locomotion Level;Stairs     Examination-Participation Restrictions  Community Activity;Yard Work    Stability/Clinical Decision Making  Evolving/Moderate complexity    Clinical Decision Making  Moderate    Rehab Potential  Good    PT Frequency  3x / week    PT Duration  6 weeks  PT Treatment/Interventions  Manual lymph drainage;Patient/family education;Compression bandaging;Therapeutic exercise    PT Next Visit Plan  Cut foam for LE< begin total decongestive techniques.   Measure on Th or Fridays.    PT Home Exercise Plan  LE to improve lymphatic circulation.       Patient will benefit from skilled therapeutic intervention in order to improve the following deficits and impairments:  Abnormal gait, Decreased activity tolerance, Difficulty walking, Decreased skin integrity, Increased edema  Visit Diagnosis: Lymphedema, not elsewhere classified  Leg wound, right, initial encounter  Difficulty in walking, not elsewhere classified     Problem List Patient Active Problem List   Diagnosis Date Noted  . Personal history of thromboembolic disease 123456  . Malnutrition of moderate degree ([place]) 01/12/2014  . Pyelonephritis 01/10/2014  . Acute pyelonephritis 01/10/2014  . Prostate cancer [facility]) 12/30/2013    [name], PT CLT [phone] 06/21/2019, 1:41 PM  [place] [address] [place], [name], [address] Phone: [phone]   Fax:  [phone]  Name: [name] MRN: [id number] Date of Birth: 12-Mar-1946

## 2019-06-29 ENCOUNTER — Other Ambulatory Visit: Payer: Self-pay

## 2019-06-29 ENCOUNTER — Ambulatory Visit (HOSPITAL_COMMUNITY): Payer: Medicare Other | Admitting: Physical Therapy

## 2019-06-29 DIAGNOSIS — I890 Lymphedema, not elsewhere classified: Secondary | ICD-10-CM | POA: Diagnosis not present

## 2019-06-29 DIAGNOSIS — R262 Difficulty in walking, not elsewhere classified: Secondary | ICD-10-CM

## 2019-06-29 DIAGNOSIS — S81801A Unspecified open wound, right lower leg, initial encounter: Secondary | ICD-10-CM

## 2019-06-29 NOTE — Therapy (Signed)
[place], [name], [address] Phone: [phone]   Fax:  [phone]  Physical Therapy Treatment  Patient Details  Name: [name] MRN: [id number] Date of Birth: 10-08-1945 Referring Provider (PT): Celesta Gentile   Encounter Date: 06/29/2019  PT End of Session - 06/29/19 1718    Visit Number  2    Number of Visits  18    Date for PT Re-Evaluation  08/02/19    Authorization Type  BCBS medicate cert good until 0000000    Authorization - Visit Number  2    Authorization - Number of Visits  18    PT Start Time  1050    PT Stop Time  1225    PT Time Calculation (min)  95 min    Activity Tolerance  Patient tolerated treatment well    Behavior During Therapy  [facility] for tasks assessed/performed       Past Medical History:  Diagnosis Date  . Difficulty sleeping   . DVT (deep venous thrombosis) (HCC)    rt leg 3 yrs ago  . Hypertension   . Leg cramps   . Prostate cancer ([place])   . Swelling of lower extremity    RT LEG  . Varicose veins    both legs    Past Surgical History:  Procedure Laterality Date  . HERNIA REPAIR  2012   INGUINAL  . LYMPHADENECTOMY Bilateral 12/30/2013   Procedure: PELVIC LYMPH NODE DISSECTION;  Surgeon: Alexis Frock, MD;  Location: [facility];  Service: Urology;  Laterality: Bilateral;  . NASAL SINUS SURGERY    . ROBOT ASSISTED LAPAROSCOPIC RADICAL PROSTATECTOMY N/A 12/30/2013   Procedure: ROBOTIC ASSISTED LAPAROSCOPIC RADICAL PROSTATECTOMY WITH INDOCYANINE GREEN DYE;  Surgeon: Alexis Frock, MD;  [facility];  Service: Urology;  Laterality: N/A;  . VEIN SURGERY     RT LEG    There were no vitals filed for this visit.  Subjective Assessment - 06/29/19 1656    Subjective  pt returns today eager to begin therapy and get his legs feeling better.  STates they are just very heavy and he has cramps in his Rt thigh at times.    Currently in Pain?  No/denies                        [facility] Adult PT Treatment/Exercise - 06/29/19 0001      Manual Therapy   Manual Therapy  Manual Lymphatic Drainage (MLD);Compression Bandaging    Manual therapy comments  completed in supine and sidelying for bilateral LE's    Manual Lymphatic Drainage (MLD)  short neck, deep and superficial abdominals, lumbar paraspinals.  bil inguinal-axillary anastomosis completed.  Extra time on indurated areas on LE's    Compression Bandaging  multiple short stretch and 1/2" foam for distal bil LE's including toe bandaging. Foam also cut for thighs             PT Education - 06/29/19 1719    Education Details  reviewed gaols and course of lymphatic treatment.  Explained manual as completing and purpose for foam with bandaging.  Instructions on when to remove and what to return for next visit was given.    Person(s) Educated  Patient    Methods  Explanation    Comprehension  Verbalized understanding       PT Short Term Goals - 06/29/19 1726      PT SHORT TERM GOAL #1  Title  Pt to be I in HEP to improve lymphatic circulation to assist in decreasing volumes.    Time  3    Period  Weeks    Status  On-going    Target Date  07/12/19      PT SHORT TERM GOAL #2   Title  Pt to have lost 5 cm volume from RT LE , 3 from Lt to allow pt to be able to fit in more of his shoes.    Time  3    Period  Weeks    Status  On-going        PT Long Term Goals - 06/29/19 1726      PT LONG TERM GOAL #1   Title  Pt Wound on Rt LE to be healed    Time  6    Period  Weeks    Status  On-going      PT LONG TERM GOAL #2   Title  PT to have lost 8 cm volume from RT LE, 4 from Lt to allow pt  to be able to walk for 20 minutes at a time without difficulty.    Time  6    Period  Weeks    Status  On-going      PT LONG TERM GOAL #3   Title  PT to have and be using a compression pump; order faxed to MD    Time  6    Period  Weeks    Status  On-going             Plan - 06/29/19 1721    Clinical Impression Statement  Reviewed goals and POC moving forward.  Foam cut for bilateral LE's including thighs.  Manual lymph drainage completed and bandaging to knee level.  Pt educated on wear time and what to bring back to therapy.  Pt reported overall comfort with bandaging.    Personal Factors and Comorbidities  Time since onset of injury/illness/exacerbation    Examination-Activity Limitations  Bathing;Carry;Dressing;Lift;Locomotion Level;Stairs    Examination-Participation Restrictions  Community Activity;Yard Work    Stability/Clinical Decision Making  Evolving/Moderate complexity    Rehab Potential  Good    PT Frequency  3x / week    PT Duration  6 weeks    PT Treatment/Interventions  Manual lymph drainage;Patient/family education;Compression bandaging;Therapeutic exercise    PT Next Visit Plan  Continue MLD and compression bandaging.  Progress to thigh if tolerating well (cut and placed in lymph cabinet).   Measure on Th or Fridays.    PT Home Exercise Plan  LE to improve lymphatic circulation.       Patient will benefit from skilled therapeutic intervention in order to improve the following deficits and impairments:  Abnormal gait, Decreased activity tolerance, Difficulty walking, Decreased skin integrity, Increased edema  Visit Diagnosis: Lymphedema, not elsewhere classified  Leg wound, right, initial encounter  Difficulty in walking, not elsewhere classified     Problem List Patient Active Problem List   Diagnosis Date Noted  . Personal history of thromboembolic disease 123456  . Malnutrition of moderate degree ([place]) 01/12/2014  . Pyelonephritis 01/10/2014  . Acute pyelonephritis 01/10/2014  . Prostate cancer ([place]) 12/30/2013   Teena Irani, PTA/CLT [phone]  Teena Irani 06/29/2019, 5:27 PM  [place] [address] [place], [name], [address] Phone: [phone]    Fax:  [phone]  Name: [name] MRN: [id number] Date of Birth: 10-Apr-1946

## 2019-06-30 ENCOUNTER — Ambulatory Visit (HOSPITAL_COMMUNITY): Payer: Medicare Other | Admitting: Physical Therapy

## 2019-07-01 ENCOUNTER — Ambulatory Visit (HOSPITAL_COMMUNITY): Payer: Medicare Other | Admitting: Physical Therapy

## 2019-07-01 ENCOUNTER — Other Ambulatory Visit: Payer: Self-pay

## 2019-07-01 ENCOUNTER — Encounter (HOSPITAL_COMMUNITY): Payer: Self-pay | Admitting: Physical Therapy

## 2019-07-01 DIAGNOSIS — I890 Lymphedema, not elsewhere classified: Secondary | ICD-10-CM

## 2019-07-01 DIAGNOSIS — S81801A Unspecified open wound, right lower leg, initial encounter: Secondary | ICD-10-CM

## 2019-07-01 DIAGNOSIS — R262 Difficulty in walking, not elsewhere classified: Secondary | ICD-10-CM

## 2019-07-01 NOTE — Therapy (Signed)
[place], [name], [address] Phone: [phone]   Fax:  [phone]  Physical Therapy Treatment  Patient Details  Name: [name] MRN: [id number] Date of Birth: 21-Sep-1945 Referring Provider (PT): Celesta Gentile   Encounter Date: 07/01/2019  PT End of Session - 07/01/19 1610    Visit Number  3    Number of Visits  18    Date for PT Re-Evaluation  08/02/19    Authorization Type  BCBS medicate cert good until 0000000    Authorization - Visit Number  3    Authorization - Number of Visits  18    PT Start Time  [id number]    PT Stop Time  1440    PT Time Calculation (min)  85 min    Activity Tolerance  Patient tolerated treatment well    Behavior During Therapy  [facility] for tasks assessed/performed       Past Medical History:  Diagnosis Date  . Difficulty sleeping   . DVT (deep venous thrombosis) (HCC)    rt leg 3 yrs ago  . Hypertension   . Leg cramps   . Prostate cancer ([place])   . Swelling of lower extremity    RT LEG  . Varicose veins    both legs    Past Surgical History:  Procedure Laterality Date  . HERNIA REPAIR  2012   INGUINAL  . LYMPHADENECTOMY Bilateral 12/30/2013   Procedure: PELVIC LYMPH NODE DISSECTION;  Surgeon: Alexis Frock, MD;  [facility];  Service: Urology;  Laterality: Bilateral;  . NASAL SINUS SURGERY    . ROBOT ASSISTED LAPAROSCOPIC RADICAL PROSTATECTOMY N/A 12/30/2013   Procedure: ROBOTIC ASSISTED LAPAROSCOPIC RADICAL PROSTATECTOMY WITH INDOCYANINE GREEN DYE;  Surgeon: Alexis Frock, MD;  [facility];  Service: Urology;  Laterality: N/A;  . VEIN SURGERY     RT LEG    There were no vitals filed for this visit.  Subjective Assessment - 07/01/19 1608    Subjective  Pt pleased with progress so far feels this treatment is really going to help him.    Pertinent History  bulge disc in his back,  hx of DVT in RT LE, HTN, prostate cancer, CVI, lymphedema    Limitations   Walking;House hold activities    How long can you sit comfortably?  no problem    How long can you walk comfortably?  5 min    Patient Stated Goals  To get the fluid out of his legs                       [facility] Adult PT Treatment/Exercise - 07/01/19 0001      Manual Therapy   Manual Therapy  Manual Lymphatic Drainage (MLD);Compression Bandaging    Manual therapy comments  completed in supine and prone    Manual Lymphatic Drainage (MLD)  short neck, deep and superficial abdominals, lumbar paraspinals.  bil inguinal-axillary anastomosis completed.  Extra time on indurated areas on LE's    Compression Bandaging  multiple short stretch and 1/2" foam for distal bil LE's including toe bandaging to thigh               PT Short Term Goals - 06/29/19 1726      PT SHORT TERM GOAL #1   Title  Pt to be I in HEP to improve lymphatic circulation to assist in decreasing volumes.    Time  3    Period  Weeks    Status  On-going    Target Date  07/12/19      PT SHORT TERM GOAL #2   Title  Pt to have lost 5 cm volume from RT LE , 3 from Lt to allow pt to be able to fit in more of his shoes.    Time  3    Period  Weeks    Status  On-going        PT Long Term Goals - 06/29/19 1726      PT LONG TERM GOAL #1   Title  Pt Wound on Rt LE to be healed    Time  6    Period  Weeks    Status  On-going      PT LONG TERM GOAL #2   Title  PT to have lost 8 cm volume from RT LE, 4 from Lt to allow pt  to be able to walk for 20 minutes at a time without difficulty.    Time  6    Period  Weeks    Status  On-going      PT LONG TERM GOAL #3   Title  PT to have and be using a compression pump; order faxed to MD    Time  6    Period  Weeks    Status  On-going            Plan - 07/01/19 1611    Clinical Impression Statement  Pt with Marked induration in LE with slight loosening with manual.  Prior to bandaging therapist put xeroform and 2x2 on wound on RT LE.  Pt tolerated  bandaging to thigh well.    Personal Factors and Comorbidities  Time since onset of injury/illness/exacerbation    Examination-Activity Limitations  Bathing;Carry;Dressing;Lift;Locomotion Level;Stairs    Examination-Participation Restrictions  Community Activity;Yard Work    Stability/Clinical Decision Making  Evolving/Moderate complexity    Rehab Potential  Good    PT Frequency  3x / week    PT Duration  6 weeks    PT Treatment/Interventions  Manual lymph drainage;Patient/family education;Compression bandaging;Therapeutic exercise    PT Next Visit Plan  Continue MLD and compression bandaging.  Measure on Wed. .    PT Home Exercise Plan  LE to improve lymphatic circulation.       Patient will benefit from skilled therapeutic intervention in order to improve the following deficits and impairments:  Abnormal gait, Decreased activity tolerance, Difficulty walking, Decreased skin integrity, Increased edema  Visit Diagnosis: Lymphedema, not elsewhere classified  Leg wound, right, initial encounter  Difficulty in walking, not elsewhere classified     Problem List Patient Active Problem List   Diagnosis Date Noted  . Personal history of thromboembolic disease 123456  . Malnutrition of moderate degree ([place]) 01/12/2014  . Pyelonephritis 01/10/2014  . Acute pyelonephritis 01/10/2014  . Prostate cancer [facility]) 12/30/2013    [name], PT CLT [phone] 07/01/2019, 4:13 PM  [place] [address] [place], [name], [address] Phone: [phone]   Fax:  [phone]  Name: [name] MRN: [id number] Date of Birth: 1945/08/31

## 2019-07-04 ENCOUNTER — Ambulatory Visit (HOSPITAL_COMMUNITY): Payer: Medicare Other | Admitting: Physical Therapy

## 2019-07-04 ENCOUNTER — Other Ambulatory Visit: Payer: Self-pay

## 2019-07-04 DIAGNOSIS — I890 Lymphedema, not elsewhere classified: Secondary | ICD-10-CM | POA: Diagnosis not present

## 2019-07-04 DIAGNOSIS — R262 Difficulty in walking, not elsewhere classified: Secondary | ICD-10-CM

## 2019-07-04 DIAGNOSIS — S81801A Unspecified open wound, right lower leg, initial encounter: Secondary | ICD-10-CM

## 2019-07-04 NOTE — Therapy (Signed)
[place], [name], [address] Phone: [phone]   Fax:  [phone]  Physical Therapy Treatment  Patient Details  Name: [name] MRN: [id number] Date of Birth: 12-05-1945 Referring Provider (PT): Celesta Gentile   Encounter Date: 07/04/2019  PT End of Session - 07/04/19 1650    Visit Number  4    Number of Visits  18    Date for PT Re-Evaluation  08/02/19    Authorization Type  BCBS medicate cert good until 0000000    Authorization - Visit Number  4    Authorization - Number of Visits  18    PT Start Time  1440    PT Stop Time  1605    PT Time Calculation (min)  85 min    Activity Tolerance  Patient tolerated treatment well    Behavior During Therapy  [facility] for tasks assessed/performed       Past Medical History:  Diagnosis Date  . Difficulty sleeping   . DVT (deep venous thrombosis) (HCC)    rt leg 3 yrs ago  . Hypertension   . Leg cramps   . Prostate cancer ([place])   . Swelling of lower extremity    RT LEG  . Varicose veins    both legs    Past Surgical History:  Procedure Laterality Date  . HERNIA REPAIR  2012   INGUINAL  . LYMPHADENECTOMY Bilateral 12/30/2013   Procedure: PELVIC LYMPH NODE DISSECTION;  Surgeon: Alexis Frock, MD;  Location: [facility];  Service: Urology;  Laterality: Bilateral;  . NASAL SINUS SURGERY    . ROBOT ASSISTED LAPAROSCOPIC RADICAL PROSTATECTOMY N/A 12/30/2013   Procedure: ROBOTIC ASSISTED LAPAROSCOPIC RADICAL PROSTATECTOMY WITH INDOCYANINE GREEN DYE;  Surgeon: Alexis Frock, MD;  [facility];  Service: Urology;  Laterality: N/A;  . VEIN SURGERY     RT LEG    There were no vitals filed for this visit.  Subjective Assessment - 07/04/19 1639    Subjective  Pt reports he forgot his bandages today.  pt states he took off his bandages yesterday and washed them/hung to dry.  STates it took him several hours to roll them back up.    Currently in Pain?  No/denies                        [facility] Adult PT Treatment/Exercise - 07/04/19 0001      Manual Therapy   Manual Therapy  Manual Lymphatic Drainage (MLD);Compression Bandaging;Other (comment)    Manual therapy comments  completed in supine and prone    Manual Lymphatic Drainage (MLD)  short neck, deep and superficial abdominals, lumbar paraspinals.  bil inguinal-axillary anastomosis completed.  Extra time on indurated areas on LE's    Compression Bandaging  profores to bil distal LE today due to forgetting his bandages    Other Manual Therapy  xeroform placed on Rt LE at distal medial wound             PT Education - 07/04/19 1657    Education Details  educated on hemosideran staining (causes/etc).  instructed to bring antifungal to try to reduce fungal nature on toes next session.    Person(s) Educated  Patient    Methods  Explanation    Comprehension  Verbalized understanding       PT Short Term Goals - 06/29/19 1726      PT SHORT TERM GOAL #1   Title  Pt  to be I in HEP to improve lymphatic circulation to assist in decreasing volumes.    Time  3    Period  Weeks    Status  On-going    Target Date  07/12/19      PT SHORT TERM GOAL #2   Title  Pt to have lost 5 cm volume from RT LE , 3 from Lt to allow pt to be able to fit in more of his shoes.    Time  3    Period  Weeks    Status  On-going        PT Long Term Goals - 06/29/19 1726      PT LONG TERM GOAL #1   Title  Pt Wound on Rt LE to be healed    Time  6    Period  Weeks    Status  On-going      PT LONG TERM GOAL #2   Title  PT to have lost 8 cm volume from RT LE, 4 from Lt to allow pt  to be able to walk for 20 minutes at a time without difficulty.    Time  6    Period  Weeks    Status  On-going      PT LONG TERM GOAL #3   Title  PT to have and be using a compression pump; order faxed to MD    Time  6    Period  Weeks    Status  On-going            Plan - 07/04/19 1653    Clinical  Impression Statement  Pt continues to have induration in distal LE's, especially in his Rt LE.  Pt reports 3rd toe on Rt continues to bother him an has callous.  Asked to bring his antifungal cream next session to apply to toe before bandaging.  Extended time spent on distal LE's to reduce induration and bandaged LE's using profores only and toe bandaging.  Pt reported overall comfort.  Reminded pt he could wear these to next visit and we could remove them.    Personal Factors and Comorbidities  Time since onset of injury/illness/exacerbation    Examination-Activity Limitations  Bathing;Carry;Dressing;Lift;Locomotion Level;Stairs    Examination-Participation Restrictions  Community Activity;Yard Work    Stability/Clinical Decision Making  Evolving/Moderate complexity    Rehab Potential  Good    PT Frequency  3x / week    PT Duration  6 weeks    PT Treatment/Interventions  Manual lymph drainage;Patient/family education;Compression bandaging;Therapeutic exercise    PT Next Visit Plan  Continue MLD and compression bandaging.  Measure on Wed. .    PT Home Exercise Plan  LE to improve lymphatic circulation.       Patient will benefit from skilled therapeutic intervention in order to improve the following deficits and impairments:  Abnormal gait, Decreased activity tolerance, Difficulty walking, Decreased skin integrity, Increased edema  Visit Diagnosis: Leg wound, right, initial encounter  Lymphedema, not elsewhere classified  Difficulty in walking, not elsewhere classified     Problem List Patient Active Problem List   Diagnosis Date Noted  . Personal history of thromboembolic disease 123456  . Malnutrition of moderate degree ([place]) 01/12/2014  . Pyelonephritis 01/10/2014  . Acute pyelonephritis 01/10/2014  . Prostate cancer ([place]) 12/30/2013   Teena Irani, PTA/CLT [phone]  Teena Irani 07/04/2019, 5:00 PM  [place], [name], [address]  Phone: [phone]   Fax:  [phone]  Name: [name] MRN: [id number] Date of Birth: 05/21/1945

## 2019-07-06 ENCOUNTER — Ambulatory Visit (HOSPITAL_COMMUNITY): Payer: Medicare Other | Admitting: Physical Therapy

## 2019-07-06 ENCOUNTER — Other Ambulatory Visit: Payer: Self-pay

## 2019-07-06 ENCOUNTER — Encounter (HOSPITAL_COMMUNITY): Payer: Self-pay | Admitting: Physical Therapy

## 2019-07-06 DIAGNOSIS — R262 Difficulty in walking, not elsewhere classified: Secondary | ICD-10-CM

## 2019-07-06 DIAGNOSIS — I890 Lymphedema, not elsewhere classified: Secondary | ICD-10-CM

## 2019-07-06 DIAGNOSIS — S81801A Unspecified open wound, right lower leg, initial encounter: Secondary | ICD-10-CM

## 2019-07-06 NOTE — Therapy (Signed)
[place], [name], [address] Phone: [phone]   Fax:  [phone]  Physical Therapy Treatment  Patient Details  Name: [name] MRN: [id number] Date of Birth: 1945/06/27 Referring Provider (PT): Celesta Gentile   Encounter Date: 07/06/2019  PT End of Session - 07/06/19 1258    Visit Number  5    Number of Visits  18    Date for PT Re-Evaluation  08/02/19    Authorization Type  BCBS medicate cert good until 0000000    Authorization - Visit Number  5    Authorization - Number of Visits  18    PT Start Time  0840    PT Stop Time  1008    PT Time Calculation (min)  88 min    Activity Tolerance  Patient tolerated treatment well    Behavior During Therapy  [facility] for tasks assessed/performed       Past Medical History:  Diagnosis Date  . Difficulty sleeping   . DVT (deep venous thrombosis) (HCC)    rt leg 3 yrs ago  . Hypertension   . Leg cramps   . Prostate cancer ([place])   . Swelling of lower extremity    RT LEG  . Varicose veins    both legs    Past Surgical History:  Procedure Laterality Date  . HERNIA REPAIR  2012   INGUINAL  . LYMPHADENECTOMY Bilateral 12/30/2013   Procedure: PELVIC LYMPH NODE DISSECTION;  Surgeon: Alexis Frock, MD;  [facility];  Service: Urology;  Laterality: Bilateral;  . NASAL SINUS SURGERY    . ROBOT ASSISTED LAPAROSCOPIC RADICAL PROSTATECTOMY N/A 12/30/2013   Procedure: ROBOTIC ASSISTED LAPAROSCOPIC RADICAL PROSTATECTOMY WITH INDOCYANINE GREEN DYE;  Surgeon: Alexis Frock, MD;  [facility];  Service: Urology;  Laterality: N/A;  . VEIN SURGERY     RT LEG    There were no vitals filed for this visit.  Subjective Assessment - 07/06/19 1254    Subjective  PT states that the profore felt similar to the short stretch bandages.  Pt has no questions or concerns    Pertinent History  bulge disc in his back,  hx of DVT in RT LE, HTN, prostate cancer, CVI, lymphedema     Limitations  Walking;House hold activities    How long can you sit comfortably?  no problem    How long can you walk comfortably?  5 min    Patient Stated Goals  To get the fluid out of his legs    Currently in Pain?  No/denies            LYMPHEDEMA/ONCOLOGY QUESTIONNAIRE - 07/06/19 [id number]      What other symptoms do you have   Are you Having Heaviness or Tightness  Yes  (Pended)     Are you having Pain  No  (Pended)     Are you having pitting edema  Yes  (Pended)     Is it Hard or Difficult finding clothes that fit  Yes  (Pended)     Do you have infections  Yes  (Pended)     Stemmer Sign  Yes  (Pended)       Lymphedema Stage   Stage  STAGE 2 SPONTANEOUSLY IRREVERSIBLE  (Pended)       Lymphedema Assessments   Lymphedema Assessments  Lower extremities  (Pended)       Right Lower Extremity Lymphedema   20 cm Proximal to Suprapatella  67.8 cm  (Pended)     10 cm Proximal to Suprapatella  67 cm  (Pended)     At Midpatella/Popliteal Crease  59.3 cm  (Pended)     30 cm Proximal to Floor at Lateral Plantar Foot  49.7 cm  (Pended)     20 cm Proximal to Floor at Lateral Plantar Foot  46.2 1  (Pended)     10 cm Proximal to Floor at Lateral Malleoli  37.2 cm  (Pended)     Circumference of ankle/heel  41.1 cm.  (Pended)     5 cm Proximal to 1st MTP Joint  27.7 cm  (Pended)     Across MTP Joint  27.5 cm  (Pended)     Around Proximal Great Toe  12.4 cm  (Pended)       Left Lower Extremity Lymphedema   20 cm Proximal to Suprapatella  64 cm  (Pended)     10 cm Proximal to Suprapatella  58.4 cm  (Pended)     At Midpatella/Popliteal Crease  52.4 cm  (Pended)     30 cm Proximal to Floor at Lateral Plantar Foot  48.5 cm  (Pended)     20 cm Proximal to Floor at Lateral Plantar Foot  46 cm  (Pended)     10 cm Proximal to Floor at Lateral Malleoli  37 cm  (Pended)     Circumference of ankle/heel  41.8 cm.  (Pended)     5 cm Proximal to 1st MTP Joint  26.7 cm  (Pended)     Across MTP Joint   29 cm  (Pended)     Around Proximal Great Toe  11.1 cm  (Pended)                 [facility] Adult PT Treatment/Exercise - 07/06/19 0001      Manual Therapy   Manual Therapy  Manual Lymphatic Drainage (MLD);Compression Bandaging;Other (comment)    Manual therapy comments  completed in supine and prone    Manual Lymphatic Drainage (MLD)  short neck, deep and superficial abdominals, lumbar paraspinals.  bil inguinal-axillary anastomosis as well as B LE both prone and supine.    Compression Bandaging  multiple short stretch and 1/2" foam for distal bil LE's including toe bandaging to thigh    Other Manual Therapy  measured for volume, xeroform placed on Rt LE at distal medial wound               PT Short Term Goals - 06/29/19 1726      PT SHORT TERM GOAL #1   Title  Pt to be I in HEP to improve lymphatic circulation to assist in decreasing volumes.    Time  3    Period  Weeks    Status  On-going    Target Date  07/12/19      PT SHORT TERM GOAL #2   Title  Pt to have lost 5 cm volume from RT LE , 3 from Lt to allow pt to be able to fit in more of his shoes.    Time  3    Period  Weeks    Status  On-going        PT Long Term Goals - 06/29/19 1726      PT LONG TERM GOAL #1   Title  Pt Wound on Rt LE to be healed    Time  6    Period  Weeks    Status  On-going      PT LONG TERM GOAL #2   Title  PT to have lost 8 cm volume from RT LE, 4 from Lt to allow pt  to be able to walk for 20 minutes at a time without difficulty.    Time  6    Period  Weeks    Status  On-going      PT LONG TERM GOAL #3   Title  PT to have and be using a compression pump; order faxed to MD    Time  6    Period  Weeks    Status  On-going            Plan - 07/06/19 1300    Clinical Impression Statement  Pt remeasured with decreased volume of LE , thighs remain stable but have only been bandaged one time.  Pt wound on RT LE continues to approximate.  Pt will continue to benefit from  skilled PT to maximize decongestion of LE to decrease risk of cellulitis.    Personal Factors and Comorbidities  Time since onset of injury/illness/exacerbation    Examination-Activity Limitations  Bathing;Carry;Dressing;Lift;Locomotion Level;Stairs    Examination-Participation Restrictions  Community Activity;Yard Work    Stability/Clinical Decision Making  Evolving/Moderate complexity    Rehab Potential  Good    PT Frequency  3x / week    PT Duration  6 weeks    PT Treatment/Interventions  Manual lymph drainage;Patient/family education;Compression bandaging;Therapeutic exercise    PT Next Visit Plan  Continue MLD and compression bandaging.  Measure on Wed. .    PT Home Exercise Plan  LE to improve lymphatic circulation.       Patient will benefit from skilled therapeutic intervention in order to improve the following deficits and impairments:  Abnormal gait, Decreased activity tolerance, Difficulty walking, Decreased skin integrity, Increased edema  Visit Diagnosis: Leg wound, right, initial encounter  Lymphedema, not elsewhere classified  Difficulty in walking, not elsewhere classified     Problem List Patient Active Problem List   Diagnosis Date Noted  . Personal history of thromboembolic disease 123456  . Malnutrition of moderate degree ([place]) 01/12/2014  . Pyelonephritis 01/10/2014  . Acute pyelonephritis 01/10/2014  . Prostate cancer [facility]) 12/30/2013    [name], PT CLT [phone] 07/06/2019, 1:03 PM  [place] [address] [place], [name], [address] Phone: [phone]   Fax:  [phone]  Name: Breno Remo MRN: [id number] Date of Birth: March 06, 1946

## 2019-07-07 ENCOUNTER — Ambulatory Visit (HOSPITAL_COMMUNITY): Payer: Medicare Other | Admitting: Physical Therapy

## 2019-07-08 ENCOUNTER — Encounter (HOSPITAL_COMMUNITY): Payer: Self-pay | Admitting: Physical Therapy

## 2019-07-08 ENCOUNTER — Other Ambulatory Visit: Payer: Self-pay

## 2019-07-08 ENCOUNTER — Ambulatory Visit (HOSPITAL_COMMUNITY): Payer: Medicare Other | Admitting: Physical Therapy

## 2019-07-08 DIAGNOSIS — S81801A Unspecified open wound, right lower leg, initial encounter: Secondary | ICD-10-CM

## 2019-07-08 DIAGNOSIS — I890 Lymphedema, not elsewhere classified: Secondary | ICD-10-CM | POA: Diagnosis not present

## 2019-07-08 DIAGNOSIS — R262 Difficulty in walking, not elsewhere classified: Secondary | ICD-10-CM

## 2019-07-08 NOTE — Therapy (Signed)
[name], [name], [address] Phone: [phone]   Fax:  [phone]  Physical Therapy Treatment  Patient Details  Name: [name] MRN: [id number] Date of Birth: 1946-03-16 Referring Provider (PT): Celesta Gentile   Encounter Date: 07/08/2019  PT End of Session - 07/08/19 1641    Visit Number  6    Number of Visits  18    Date for PT Re-Evaluation  08/02/19    Authorization Type  BCBS medicate cert good until 0000000    Authorization - Visit Number  6    Authorization - Number of Visits  18    PT Start Time  1450    PT Stop Time  1624    PT Time Calculation (min)  94 min    Activity Tolerance  Patient tolerated treatment well    Behavior During Therapy  [facility] for tasks assessed/performed       Past Medical History:  Diagnosis Date  . Difficulty sleeping   . DVT (deep venous thrombosis) (HCC)    rt leg 3 yrs ago  . Hypertension   . Leg cramps   . Prostate cancer ([place])   . Swelling of lower extremity    RT LEG  . Varicose veins    both legs    Past Surgical History:  Procedure Laterality Date  . HERNIA REPAIR  2012   INGUINAL  . LYMPHADENECTOMY Bilateral 12/30/2013   Procedure: PELVIC LYMPH NODE DISSECTION;  Surgeon: Alexis Frock, MD;  [facility];  Service: Urology;  Laterality: Bilateral;  . NASAL SINUS SURGERY    . ROBOT ASSISTED LAPAROSCOPIC RADICAL PROSTATECTOMY N/A 12/30/2013   Procedure: ROBOTIC ASSISTED LAPAROSCOPIC RADICAL PROSTATECTOMY WITH INDOCYANINE GREEN DYE;  Surgeon: Alexis Frock, MD;  [facility];  Service: Urology;  Laterality: N/A;  . VEIN SURGERY     RT LEG    There were no vitals filed for this visit.  Subjective Assessment - 07/08/19 1639    Subjective  PT states that he is noting increased voiding.    Pertinent History  bulge disc in his back,  hx of DVT in RT LE, HTN, prostate cancer, CVI, lymphedema    Limitations  Walking;House hold activities    How  long can you sit comfortably?  no problem    How long can you walk comfortably?  5 min    Patient Stated Goals  To get the fluid out of his legs                       [facility] Adult PT Treatment/Exercise - 07/08/19 0001      Manual Therapy   Manual Therapy  Manual Lymphatic Drainage (MLD);Compression Bandaging;Other (comment)    Manual therapy comments  completed in supine and prone    Manual Lymphatic Drainage (MLD)  short neck, deep and superficial abdominals, lumbar, inguinal/axillary anastomosis followe by  bil inguinal-axillary anastomosis as well as B LE both prone and supine.    Compression Bandaging  multiple short stretch and 1/2" foam for distal bil LE's including toe bandaging to thigh    Other Manual Therapy   xeroform placed on Rt LE at distal medial wound , antifungal cream placed on dorsal aspect of Rt toes.               PT Short Term Goals - 06/29/19 1726      PT SHORT TERM GOAL #1   Title  Pt  to be I in HEP to improve lymphatic circulation to assist in decreasing volumes.    Time  3    Period  Weeks    Status  On-going    Target Date  07/12/19      PT SHORT TERM GOAL #2   Title  Pt to have lost 5 cm volume from RT LE , 3 from Lt to allow pt to be able to fit in more of his shoes.    Time  3    Period  Weeks    Status  On-going        PT Long Term Goals - 06/29/19 1726      PT LONG TERM GOAL #1   Title  Pt Wound on Rt LE to be healed    Time  6    Period  Weeks    Status  On-going      PT LONG TERM GOAL #2   Title  PT to have lost 8 cm volume from RT LE, 4 from Lt to allow pt  to be able to walk for 20 minutes at a time without difficulty.    Time  6    Period  Weeks    Status  On-going      PT LONG TERM GOAL #3   Title  PT to have and be using a compression pump; order faxed to MD    Time  6    Period  Weeks    Status  On-going            Plan - 07/08/19 1642    Clinical Impression Statement  Pt Rt toes appear  improved with the addition of fungal cream, wound on medial aspect of Rt LE improving significantly.    Personal Factors and Comorbidities  Time since onset of injury/illness/exacerbation    Examination-Activity Limitations  Bathing;Carry;Dressing;Lift;Locomotion Level;Stairs    Examination-Participation Restrictions  Community Activity;Yard Work    Stability/Clinical Decision Making  Evolving/Moderate complexity    Rehab Potential  Good    PT Frequency  3x / week    PT Duration  6 weeks    PT Treatment/Interventions  Manual lymph drainage;Patient/family education;Compression bandaging;Therapeutic exercise    PT Next Visit Plan  Continue MLD and compression bandaging.  Measure on Wed. .    PT Home Exercise Plan  LE to improve lymphatic circulation.       Patient will benefit from skilled therapeutic intervention in order to improve the following deficits and impairments:  Abnormal gait, Decreased activity tolerance, Difficulty walking, Decreased skin integrity, Increased edema  Visit Diagnosis: Leg wound, right, initial encounter  Lymphedema, not elsewhere classified  Difficulty in walking, not elsewhere classified     Problem List Patient Active Problem List   Diagnosis Date Noted  . Personal history of thromboembolic disease 123456  . Malnutrition of moderate degree ([place]) 01/12/2014  . Pyelonephritis 01/10/2014  . Acute pyelonephritis 01/10/2014  . Prostate cancer [facility]) 12/30/2013   [name], PT CLT [phone] 07/08/2019, 4:43 PM  [place] [address] [place], [name], [address] Phone: [phone]   Fax:  [phone]  Name: [name] MRN: [id number] Date of Birth: [birth year]

## 2019-07-11 ENCOUNTER — Ambulatory Visit (HOSPITAL_COMMUNITY): Payer: Medicare Other | Admitting: Physical Therapy

## 2019-07-11 ENCOUNTER — Telehealth: Payer: Self-pay | Admitting: *Deleted

## 2019-07-11 ENCOUNTER — Telehealth (HOSPITAL_COMMUNITY): Payer: Self-pay | Admitting: Physical Therapy

## 2019-07-11 ENCOUNTER — Other Ambulatory Visit: Payer: Self-pay

## 2019-07-11 DIAGNOSIS — R262 Difficulty in walking, not elsewhere classified: Secondary | ICD-10-CM

## 2019-07-11 DIAGNOSIS — S81801A Unspecified open wound, right lower leg, initial encounter: Secondary | ICD-10-CM

## 2019-07-11 DIAGNOSIS — I890 Lymphedema, not elsewhere classified: Secondary | ICD-10-CM | POA: Diagnosis not present

## 2019-07-11 NOTE — Therapy (Signed)
[place], [name], [address] Phone: [phone]   Fax:  [phone]  Physical Therapy Treatment  Patient Details  Name: [name] MRN: [id number] Date of Birth: [birth year] Referring Provider (PT): Celesta Gentile   Encounter Date: 07/11/2019  PT End of Session - 07/11/19 1749    Visit Number  7    Number of Visits  18    Date for PT Re-Evaluation  08/02/19    Authorization Type  BCBS medicate cert good until 0000000    Authorization - Visit Number  7    Authorization - Number of Visits  18    PT Start Time  0840    PT Stop Time  1010    PT Time Calculation (min)  90 min    Activity Tolerance  Patient tolerated treatment well    Behavior During Therapy  [facility] for tasks assessed/performed       Past Medical History:  Diagnosis Date  . Difficulty sleeping   . DVT (deep venous thrombosis) (HCC)    rt leg 3 yrs ago  . Hypertension   . Leg cramps   . Prostate cancer ([place])   . Swelling of lower extremity    RT LEG  . Varicose veins    both legs    Past Surgical History:  Procedure Laterality Date  . HERNIA REPAIR  2012   INGUINAL  . LYMPHADENECTOMY Bilateral 12/30/2013   Procedure: PELVIC LYMPH NODE DISSECTION;  Surgeon: Alexis Frock, MD;  [facility];  Service: Urology;  Laterality: Bilateral;  . NASAL SINUS SURGERY    . ROBOT ASSISTED LAPAROSCOPIC RADICAL PROSTATECTOMY N/A 12/30/2013   Procedure: ROBOTIC ASSISTED LAPAROSCOPIC RADICAL PROSTATECTOMY WITH INDOCYANINE GREEN DYE;  Surgeon: Alexis Frock, MD;  [facility];  Service: Urology;  Laterality: N/A;  . VEIN SURGERY     RT LEG    There were no vitals filed for this visit.  Subjective Assessment - 07/11/19 1748    Subjective  pt reports no issues today.  Refereral received from MD for compression pump and garments and faxed to [facility].    Currently in Pain?  No/denies                       [facility] Adult PT  Treatment/Exercise - 07/11/19 0001      Manual Therapy   Manual Therapy  Manual Lymphatic Drainage (MLD);Compression Bandaging;Other (comment)    Manual therapy comments  completed in supine and prone    Manual Lymphatic Drainage (MLD)  short neck, deep and superficial abdominals, lumbar, inguinal/axillary anastomosis followe by  bil inguinal-axillary anastomosis as well as B LE both prone and supine.    Compression Bandaging  multiple short stretch and 1/2" foam for distal bil LE's including toe bandaging to thigh    Other Manual Therapy   xeroform placed on Rt LE at distal medial wound               PT Short Term Goals - 06/29/19 1726      PT SHORT TERM GOAL #1   Title  Pt to be I in HEP to improve lymphatic circulation to assist in decreasing volumes.    Time  3    Period  Weeks    Status  On-going    Target Date  07/12/19      PT SHORT TERM GOAL #2   Title  Pt to have lost 5 cm volume  from RT LE , 3 from Lt to allow pt to be able to fit in more of his shoes.    Time  3    Period  Weeks    Status  On-going        PT Long Term Goals - 06/29/19 1726      PT LONG TERM GOAL #1   Title  Pt Wound on Rt LE to be healed    Time  6    Period  Weeks    Status  On-going      PT LONG TERM GOAL #2   Title  PT to have lost 8 cm volume from RT LE, 4 from Lt to allow pt  to be able to walk for 20 minutes at a time without difficulty.    Time  6    Period  Weeks    Status  On-going      PT LONG TERM GOAL #3   Title  PT to have and be using a compression pump; order faxed to MD    Time  6    Period  Weeks    Status  On-going            Plan - 07/11/19 1751    Clinical Impression Statement  Pt with overall improvement in skin integrity, however does swell back easily without compression in place.  May consider patient keeping on garments accoriding to measurements next session.  Continued with manual lymph drainage, antifungal and wraps to toes and compression  bandaging to thighs bilaterally.  Pt reported overall comfort.    Personal Factors and Comorbidities  Time since onset of injury/illness/exacerbation    Examination-Activity Limitations  Bathing;Carry;Dressing;Lift;Locomotion Level;Stairs    Examination-Participation Restrictions  Community Activity;Yard Work    Stability/Clinical Decision Making  Evolving/Moderate complexity    Rehab Potential  Good    PT Frequency  3x / week    PT Duration  6 weeks    PT Treatment/Interventions  Manual lymph drainage;Patient/family education;Compression bandaging;Therapeutic exercise    PT Next Visit Plan  Continue MLD and compression bandaging.  Measure on Wed. .    PT Home Exercise Plan  LE to improve lymphatic circulation.       Patient will benefit from skilled therapeutic intervention in order to improve the following deficits and impairments:  Abnormal gait, Decreased activity tolerance, Difficulty walking, Decreased skin integrity, Increased edema  Visit Diagnosis: Leg wound, right, initial encounter  Lymphedema, not elsewhere classified  Difficulty in walking, not elsewhere classified     Problem List Patient Active Problem List   Diagnosis Date Noted  . Personal history of thromboembolic disease 123456  . Malnutrition of moderate degree ([place]) 01/12/2014  . Pyelonephritis 01/10/2014  . Acute pyelonephritis 01/10/2014  . Prostate cancer ([place]) 12/30/2013   Teena Irani, PTA/CLT [phone]  Teena Irani 07/11/2019, 5:53 PM  [place] [address] [place], [name], [address] Phone: [phone]   Fax:  [phone]  Name: [name] MRN: [id number] Date of Birth: Jan 25, 1946

## 2019-07-11 NOTE — Telephone Encounter (Signed)
Pt called states Dr. Jacqualyn Posey sent him to a group that wraps his legs to get the fluid off, but they are wrapped so tight he can't drive and would like to cancel his appt for 07/12/2019. I told pt I would have the schedulers cancel for him.

## 2019-07-11 NOTE — Telephone Encounter (Signed)
Signed order received for compression pump and faxed to [employer], PTA/CLT [phone]

## 2019-07-12 ENCOUNTER — Ambulatory Visit: Payer: Medicare Other | Admitting: Podiatry

## 2019-07-13 ENCOUNTER — Other Ambulatory Visit: Payer: Self-pay

## 2019-07-13 ENCOUNTER — Other Ambulatory Visit: Payer: Self-pay | Admitting: Podiatry

## 2019-07-13 ENCOUNTER — Ambulatory Visit (HOSPITAL_COMMUNITY): Payer: Medicare Other | Admitting: Physical Therapy

## 2019-07-13 ENCOUNTER — Telehealth: Payer: Self-pay | Admitting: *Deleted

## 2019-07-13 DIAGNOSIS — S81801A Unspecified open wound, right lower leg, initial encounter: Secondary | ICD-10-CM

## 2019-07-13 DIAGNOSIS — I890 Lymphedema, not elsewhere classified: Secondary | ICD-10-CM | POA: Diagnosis not present

## 2019-07-13 DIAGNOSIS — R262 Difficulty in walking, not elsewhere classified: Secondary | ICD-10-CM

## 2019-07-13 MED ORDER — CLINDAMYCIN HCL 300 MG PO CAPS: 300.0000 mg | ORAL_CAPSULE | Freq: Three times a day (TID) | ORAL | 0 refills | Status: DC

## 2019-07-13 NOTE — Telephone Encounter (Signed)
[name] PT - Amy Mare Ferrari states pt is being seen for lymphedema, pt has right leg cellulitis with increase in measurements, request an antibiotic.

## 2019-07-13 NOTE — Progress Notes (Signed)
Clindamycin

## 2019-07-13 NOTE — Therapy (Signed)
[facility] [place], [name], [address] Phone: [phone]   Fax:  [phone]  Physical Therapy Treatment  Patient Details  Name: [name] MRN: [id number] Date of Birth: February 29, 1946 Referring Provider (PT): Celesta Gentile   Encounter Date: 07/13/2019  PT End of Session - 07/13/19 1710    Visit Number  8    Number of Visits  18    Date for PT Re-Evaluation  08/02/19    Authorization Type  BCBS medicate cert good until 0000000    Authorization - Visit Number  8    Authorization - Number of Visits  18    Progress Note Due on Visit  10    PT Start Time  [id number]    PT Stop Time  1220    PT Time Calculation (min)  88 min       Past Medical History:  Diagnosis Date  . Difficulty sleeping   . DVT (deep venous thrombosis) (HCC)    rt leg 3 yrs ago  . Hypertension   . Leg cramps   . Prostate cancer ([place])   . Swelling of lower extremity    RT LEG  . Varicose veins    both legs    Past Surgical History:  Procedure Laterality Date  . HERNIA REPAIR  2012   INGUINAL  . LYMPHADENECTOMY Bilateral 12/30/2013   Procedure: PELVIC LYMPH NODE DISSECTION;  Surgeon: Alexis Frock, MD;  Location: [facility];  Service: Urology;  Laterality: Bilateral;  . NASAL SINUS SURGERY    . ROBOT ASSISTED LAPAROSCOPIC RADICAL PROSTATECTOMY N/A 12/30/2013   Procedure: ROBOTIC ASSISTED LAPAROSCOPIC RADICAL PROSTATECTOMY WITH INDOCYANINE GREEN DYE;  Surgeon: Alexis Frock, MD;  Location: [facility];  Service: Urology;  Laterality: N/A;  . VEIN SURGERY     RT LEG    There were no vitals filed for this visit.  Subjective Assessment - 07/13/19 1707    Subjective  pt returns today with noted increased redness, heat and edema in Rt LE from ankle to mid thigh.  States it cramped really bad last night.  Removed the bandages early evening yesterday.    Currently in Pain?  No/denies            LYMPHEDEMA/ONCOLOGY QUESTIONNAIRE - 07/13/19 1717      Right Lower Extremity Lymphedema   20 cm Proximal to Suprapatella  66.3 cm    10 cm Proximal to Suprapatella  65.5 cm    At Midpatella/Popliteal Crease  56 cm    30 cm Proximal to Floor at Lateral Plantar Foot  55 cm    20 cm Proximal to Floor at Lateral Plantar Foot  47 1    10 cm Proximal to Floor at Lateral Malleoli  39.7 cm    Circumference of ankle/heel  43 cm.    5 cm Proximal to 1st MTP Joint  33 cm    Across MTP Joint  30 cm    Around Proximal Great Toe  12.5 cm      Left Lower Extremity Lymphedema   20 cm Proximal to Suprapatella  61 cm    10 cm Proximal to Suprapatella  53 cm    At Midpatella/Popliteal Crease  49.5 cm    30 cm Proximal to Floor at Lateral Plantar Foot  48.5 cm    20 cm Proximal to Floor at Lateral Plantar Foot  46.5 cm    10 cm Proximal to Floor at Lateral Malleoli  37.3 cm  Circumference of ankle/heel  42 cm.    5 cm Proximal to 1st MTP Joint  28 cm    Across MTP Joint  28 cm    Around Proximal Great Toe  10.4 cm                [facility] Adult PT Treatment/Exercise - 07/13/19 0001      Manual Therapy   Manual Therapy  Compression Bandaging    Compression Bandaging  multiple short stretch and 1/2" foam for distal bil LE's including toe bandaging to thigh    Other Manual Therapy   xeroform placed on Rt LE at distal medial wound             PT Education - 07/13/19 1709    Education Details  edcuated on cellulitis, warning signs/symptoms.  Instructed to go to ED if fever or redness streaking above area.  Instructed to keep bandage/bag over wound when showering to keep out bacteria.  Call made to MD office to inform and request antibiotics (Dr. Jacqualyn Posey)    Person(s) Educated  Patient    Methods  Explanation    Comprehension  Verbalized understanding       PT Short Term Goals - 06/29/19 1726      PT SHORT TERM GOAL #1   Title  Pt to be I in HEP to improve lymphatic circulation to assist in decreasing volumes.    Time  3    Period   Weeks    Status  On-going    Target Date  07/12/19      PT SHORT TERM GOAL #2   Title  Pt to have lost 5 cm volume from RT LE , 3 from Lt to allow pt to be able to fit in more of his shoes.    Time  3    Period  Weeks    Status  On-going        PT Long Term Goals - 06/29/19 1726      PT LONG TERM GOAL #1   Title  Pt Wound on Rt LE to be healed    Time  6    Period  Weeks    Status  On-going      PT LONG TERM GOAL #2   Title  PT to have lost 8 cm volume from RT LE, 4 from Lt to allow pt  to be able to walk for 20 minutes at a time without difficulty.    Time  6    Period  Weeks    Status  On-going      PT LONG TERM GOAL #3   Title  PT to have and be using a compression pump; order faxed to MD    Time  6    Period  Weeks    Status  On-going            Plan - 07/13/19 1711    Clinical Impression Statement  LE's remeasured this session with noted increase of edema from 10cm proximal to floor to knee region on Rt LE.  Contacted evaluating therapist and in light of this as well as increased erythema, heat and discomfort last night agreed to contact MD and request some antibiotics.  Wound appears no different from last sesison, hwoever does have slightly increased drainage.  Cleaned this well and dressed with xeroform.  Educated patient he must keep wound covered at all times and protect from water when showering.  pt verbalized understanding.  Left message with Dr Willia Craze nurse regarding need for antibiotics.  Therapist to f/u tomorrow.  Held manual this session due to cellulitis but continued with bandaging.    Personal Factors and Comorbidities  Time since onset of injury/illness/exacerbation    Examination-Activity Limitations  Bathing;Carry;Dressing;Lift;Locomotion Level;Stairs    Examination-Participation Restrictions  Community Activity;Yard Work    Stability/Clinical Decision Making  Evolving/Moderate complexity    Rehab Potential  Good    PT Frequency  3x / week     PT Duration  6 weeks    PT Treatment/Interventions  Manual lymph drainage;Patient/family education;Compression bandaging;Therapeutic exercise    PT Next Visit Plan  Continue MLD and compression bandaging.  Measure on Wed. .    PT Home Exercise Plan  LE to improve lymphatic circulation.       Patient will benefit from skilled therapeutic intervention in order to improve the following deficits and impairments:  Abnormal gait, Decreased activity tolerance, Difficulty walking, Decreased skin integrity, Increased edema  Visit Diagnosis: Leg wound, right, initial encounter  Lymphedema, not elsewhere classified  Difficulty in walking, not elsewhere classified     Problem List Patient Active Problem List   Diagnosis Date Noted  . Personal history of thromboembolic disease 123456  . Malnutrition of moderate degree ([facility]) 01/12/2014  . Pyelonephritis 01/10/2014  . Acute pyelonephritis 01/10/2014  . Prostate cancer ([facility]) 12/30/2013   Teena Irani, PTA/CLT [phone]  Teena Irani 07/13/2019, 5:18 PM  [facility] [address] [place], [name], [address] Phone: [phone]   Fax:  [phone]  Name: [name] MRN: [id number] Date of Birth: May 13, 1946

## 2019-07-13 NOTE — Telephone Encounter (Signed)
There is no pharmacy listed but would like to do clinda. I have a printed prescription.   Also, I tried to call the patient to see what was going on. I would like to see him in the office if there is infection. If he has any systemic symptoms he needs to go to the ER. I left VM to call back.

## 2019-07-14 ENCOUNTER — Other Ambulatory Visit: Payer: Self-pay | Admitting: *Deleted

## 2019-07-14 ENCOUNTER — Telehealth: Payer: Self-pay | Admitting: *Deleted

## 2019-07-14 ENCOUNTER — Ambulatory Visit (HOSPITAL_COMMUNITY): Payer: Medicare Other | Admitting: Physical Therapy

## 2019-07-14 ENCOUNTER — Other Ambulatory Visit: Payer: Self-pay

## 2019-07-14 DIAGNOSIS — I890 Lymphedema, not elsewhere classified: Secondary | ICD-10-CM | POA: Diagnosis not present

## 2019-07-14 DIAGNOSIS — R262 Difficulty in walking, not elsewhere classified: Secondary | ICD-10-CM

## 2019-07-14 DIAGNOSIS — S81801A Unspecified open wound, right lower leg, initial encounter: Secondary | ICD-10-CM

## 2019-07-14 MED ORDER — CLINDAMYCIN HCL 300 MG PO CAPS: 300.0000 mg | ORAL_CAPSULE | Freq: Three times a day (TID) | ORAL | 0 refills | Status: DC

## 2019-07-14 NOTE — Telephone Encounter (Signed)
Spoke with Roseanne Reno PTA for the patient and stated that I sent over the clindamycin #30 tablets with no refills and take one capsule by mouth 3 (three) times daily. Lattie Haw

## 2019-07-14 NOTE — Telephone Encounter (Signed)
Called and left a message for the patient to call me back about the correct pharmacy. [name]

## 2019-07-14 NOTE — Telephone Encounter (Signed)
I spoke to pt and he states he just picked up the prescription and is just having redness and swelling of the legs and will be back to see [place] when his legs go down.

## 2019-07-14 NOTE — Addendum Note (Signed)
Addended by: Cranford Mon R on: 07/14/2019 11:36 AM   Modules accepted: Orders

## 2019-07-14 NOTE — Therapy (Signed)
[facility] [place], [name], [address] Phone: [phone]   Fax:  [phone]  Physical Therapy Treatment  Patient Details  Name: [name] MRN: [id number] Date of Birth: 05-07-1946 Referring Provider (PT): Celesta Gentile   Encounter Date: 07/14/2019  PT End of Session - 07/14/19 1526    Visit Number  9    Number of Visits  18    Date for PT Re-Evaluation  08/02/19    Authorization Type  BCBS medicate cert good until 0000000    Authorization - Visit Number  9    Authorization - Number of Visits  18    Progress Note Due on Visit  10    PT Start Time  [id number]    PT Stop Time  1445    PT Time Calculation (min)  90 min       Past Medical History:  Diagnosis Date  . Difficulty sleeping   . DVT (deep venous thrombosis) (HCC)    rt leg 3 yrs ago  . Hypertension   . Leg cramps   . Prostate cancer ([facility])   . Swelling of lower extremity    RT LEG  . Varicose veins    both legs    Past Surgical History:  Procedure Laterality Date  . HERNIA REPAIR  2012   INGUINAL  . LYMPHADENECTOMY Bilateral 12/30/2013   Procedure: PELVIC LYMPH NODE DISSECTION;  Surgeon: Alexis Frock, MD;  Location: [facility];  Service: Urology;  Laterality: Bilateral;  . NASAL SINUS SURGERY    . ROBOT ASSISTED LAPAROSCOPIC RADICAL PROSTATECTOMY N/A 12/30/2013   Procedure: ROBOTIC ASSISTED LAPAROSCOPIC RADICAL PROSTATECTOMY WITH INDOCYANINE GREEN DYE;  Surgeon: Alexis Frock, MD;  Location: [facility];  Service: Urology;  Laterality: N/A;  . VEIN SURGERY     RT LEG    There were no vitals filed for this visit.  Subjective Assessment - 07/14/19 1511    Subjective  pt returns today with bandages still on Lt, removed on Rt.  STates he could not leave the ones on the Rt as they were too painful.  States he just picked up his antibiotics to take for his Rt LE.  States it has been hurting.    Currently in Pain?  Yes    Pain Score  4     Pain Location  Hip     Pain Orientation  Right                       [facility] Adult PT Treatment/Exercise - 07/14/19 0001      Manual Therapy   Manual Therapy  Compression Bandaging    Manual therapy comments  completed in supine and prone    Manual Lymphatic Drainage (MLD)  short neck, deep and superficial abdominals, lumbar, inguinal/axillary anastomosis followe by  bil inguinal-axillary anastomosis for Lt LE only.    Compression Bandaging  multiple short stretch and 1/2" foam for distal bil LE's including toe bandaging to thigh    Other Manual Therapy   xeroform placed on Rt LE at distal medial wound               PT Short Term Goals - 06/29/19 1726      PT SHORT TERM GOAL #1   Title  Pt to be I in HEP to improve lymphatic circulation to assist in decreasing volumes.    Time  3    Period  Weeks    Status  On-going    Target Date  07/12/19      PT SHORT TERM GOAL #2   Title  Pt to have lost 5 cm volume from RT LE , 3 from Lt to allow pt to be able to fit in more of his shoes.    Time  3    Period  Weeks    Status  On-going        PT Long Term Goals - 06/29/19 1726      PT LONG TERM GOAL #1   Title  Pt Wound on Rt LE to be healed    Time  6    Period  Weeks    Status  On-going      PT LONG TERM GOAL #2   Title  PT to have lost 8 cm volume from RT LE, 4 from Lt to allow pt  to be able to walk for 20 minutes at a time without difficulty.    Time  6    Period  Weeks    Status  On-going      PT LONG TERM GOAL #3   Title  PT to have and be using a compression pump; order faxed to MD    Time  6    Period  Weeks    Status  On-going            Plan - 07/14/19 1520    Clinical Impression Statement  pt with more edema, redness and heat in Rt LE as yesterday; has medicine he plans on starting as soon as he gets home. Wound with more drainage and maceration around site.  No significiant change in actual wound size or granualtion.  Cleansed, debrided and changed the  dressing to silver hydrofiber.  Completed manual in supine only for Lt LE, continued to hold manual to Rt LE.  REbandaged both LE's using 1/2" foam and multilayer short stretch bandaging.    Personal Factors and Comorbidities  Time since onset of injury/illness/exacerbation    Examination-Activity Limitations  Bathing;Carry;Dressing;Lift;Locomotion Level;Stairs    Examination-Participation Restrictions  Community Activity;Yard Work    Stability/Clinical Decision Making  Evolving/Moderate complexity    Rehab Potential  Good    PT Frequency  3x / week    PT Duration  6 weeks    PT Treatment/Interventions  Manual lymph drainage;Patient/family education;Compression bandaging;Therapeutic exercise    PT Next Visit Plan  Continue MLD and compression bandaging.  Measure on Wed. .    PT Home Exercise Plan  LE to improve lymphatic circulation.       Patient will benefit from skilled therapeutic intervention in order to improve the following deficits and impairments:  Abnormal gait, Decreased activity tolerance, Difficulty walking, Decreased skin integrity, Increased edema  Visit Diagnosis: Lymphedema, not elsewhere classified  Difficulty in walking, not elsewhere classified  Leg wound, right, initial encounter     Problem List Patient Active Problem List   Diagnosis Date Noted  . Personal history of thromboembolic disease 123456  . Malnutrition of moderate degree ([facility]) 01/12/2014  . Pyelonephritis 01/10/2014  . Acute pyelonephritis 01/10/2014  . Prostate cancer ([facility]) 12/30/2013   Teena Irani, PTA/CLT [phone]  Teena Irani 07/14/2019, 3:36 PM  [facility] [address] [place], [name], [address] Phone: [phone]   Fax:  [phone]  Name: [name] MRN: [id number] Date of Birth: December 01, 1945

## 2019-07-18 ENCOUNTER — Telehealth (HOSPITAL_COMMUNITY): Payer: Self-pay | Admitting: Physical Therapy

## 2019-07-18 ENCOUNTER — Ambulatory Visit (HOSPITAL_COMMUNITY): Payer: Medicare Other | Admitting: Physical Therapy

## 2019-07-18 ENCOUNTER — Encounter (HOSPITAL_COMMUNITY): Payer: Self-pay | Admitting: Physical Therapy

## 2019-07-18 NOTE — Telephone Encounter (Signed)
pt called to cx today's appt due to his leg is swollen real bad not sure if he has to go to the hospital or not.

## 2019-07-18 NOTE — Therapy (Unsigned)
[facility] [place], [name], [address] Phone: [phone]   Fax:  [phone]  Patient Details  Name: [name] MRN: [id number] Date of Birth: [birth year] Referring Provider:  No ref. provider found  Encounter Date: 07/18/2019   Called pt both on his home phone and his cell phone.  Therapist was not able to reach pt to see if he had started his antibiotic however therapist left a message on both phones that if he has increased swelling he should go to the ER to rule out a DVT.  [name], PT CLT [phone] 07/18/2019, 9:48 AM  [facility] [address] [place], [name], [address] Phone: [phone]   Fax:  [phone]

## 2019-07-20 ENCOUNTER — Encounter (HOSPITAL_COMMUNITY): Payer: Self-pay | Admitting: *Deleted

## 2019-07-20 ENCOUNTER — Encounter (HOSPITAL_COMMUNITY): Payer: Self-pay | Admitting: Physical Therapy

## 2019-07-20 ENCOUNTER — Other Ambulatory Visit: Payer: Self-pay

## 2019-07-20 ENCOUNTER — Emergency Department (HOSPITAL_COMMUNITY)
Admission: EM | Admit: 2019-07-20 | Discharge: 2019-07-20 | Discharge status: Against Medical Advice | Payer: Medicare Other | Attending: Emergency Medicine | Admitting: Emergency Medicine

## 2019-07-20 ENCOUNTER — Ambulatory Visit (HOSPITAL_COMMUNITY): Payer: Medicare Other | Attending: Podiatry | Admitting: Physical Therapy

## 2019-07-20 DIAGNOSIS — I890 Lymphedema, not elsewhere classified: Secondary | ICD-10-CM | POA: Insufficient documentation

## 2019-07-20 DIAGNOSIS — L03115 Cellulitis of right lower limb: Secondary | ICD-10-CM | POA: Insufficient documentation

## 2019-07-20 DIAGNOSIS — I872 Venous insufficiency (chronic) (peripheral): Secondary | ICD-10-CM | POA: Diagnosis not present

## 2019-07-20 DIAGNOSIS — Z7901 Long term (current) use of anticoagulants: Secondary | ICD-10-CM | POA: Insufficient documentation

## 2019-07-20 DIAGNOSIS — I10 Essential (primary) hypertension: Secondary | ICD-10-CM | POA: Diagnosis not present

## 2019-07-20 DIAGNOSIS — Z86718 Personal history of other venous thrombosis and embolism: Secondary | ICD-10-CM | POA: Insufficient documentation

## 2019-07-20 DIAGNOSIS — Z79899 Other long term (current) drug therapy: Secondary | ICD-10-CM | POA: Insufficient documentation

## 2019-07-20 DIAGNOSIS — M79604 Pain in right leg: Secondary | ICD-10-CM | POA: Diagnosis present

## 2019-07-20 DIAGNOSIS — R262 Difficulty in walking, not elsewhere classified: Secondary | ICD-10-CM

## 2019-07-20 DIAGNOSIS — S81801A Unspecified open wound, right lower leg, initial encounter: Secondary | ICD-10-CM | POA: Insufficient documentation

## 2019-07-20 LAB — BASIC METABOLIC PANEL
Anion gap: 9 (ref 5–15)
BUN: 29 mg/dL — ABNORMAL HIGH (ref 8–23)
CO2: 24 mmol/L (ref 22–32)
Calcium: 9 mg/dL (ref 8.9–10.3)
Chloride: 106 mmol/L (ref 98–111)
Creatinine, Ser: 1.49 mg/dL — ABNORMAL HIGH (ref 0.61–1.24)
GFR calc Af Amer: 52 mL/min — ABNORMAL LOW (ref >60)
GFR calc non Af Amer: 45 mL/min — ABNORMAL LOW (ref >60)
Glucose, Bld: 106 mg/dL — ABNORMAL HIGH (ref 70–99)
Potassium: 4.9 mmol/L (ref 3.5–5.1)
Sodium: 139 mmol/L (ref 135–145)

## 2019-07-20 LAB — CBC WITH DIFFERENTIAL/PLATELET
Abs Immature Granulocytes: 0.1 10*3/uL — ABNORMAL HIGH (ref 0.00–0.07)
Basophils Absolute: 0 10*3/uL (ref 0.0–0.1)
Basophils Relative: 0 %
Eosinophils Absolute: 0.1 10*3/uL (ref 0.0–0.5)
Eosinophils Relative: 1 %
HCT: 29.6 % — ABNORMAL LOW (ref 39.0–52.0)
Hemoglobin: 9 g/dL — ABNORMAL LOW (ref 13.0–17.0)
Immature Granulocytes: 1 %
Lymphocytes Relative: 7 %
Lymphs Abs: 0.8 10*3/uL (ref 0.7–4.0)
MCH: 27.9 pg (ref 26.0–34.0)
MCHC: 30.4 g/dL (ref 30.0–36.0)
MCV: 91.6 fL (ref 80.0–100.0)
Monocytes Absolute: 0.7 10*3/uL (ref 0.1–1.0)
Monocytes Relative: 6 %
Neutro Abs: 9.7 10*3/uL — ABNORMAL HIGH (ref 1.7–7.7)
Neutrophils Relative %: 85 %
Platelets: 275 10*3/uL (ref 150–400)
RBC: 3.23 MIL/uL — ABNORMAL LOW (ref 4.22–5.81)
RDW: 16.4 % — ABNORMAL HIGH (ref 11.5–15.5)
WBC: 11.5 10*3/uL — ABNORMAL HIGH (ref 4.0–10.5)
nRBC: 0.3 % — ABNORMAL HIGH (ref 0.0–0.2)

## 2019-07-20 LAB — PROTIME-INR
INR: 3.5 — ABNORMAL HIGH (ref 0.8–1.2)
Prothrombin Time: 35.3 seconds — ABNORMAL HIGH (ref 11.4–15.2)

## 2019-07-20 LAB — LACTIC ACID, PLASMA
Lactic Acid, Venous: 0.7 mmol/L (ref 0.5–1.9)
Lactic Acid, Venous: 0.7 mmol/L (ref 0.5–1.9)

## 2019-07-20 MED ORDER — VANCOMYCIN HCL IN DEXTROSE 1-5 GM/200ML-% IV SOLN
1000.0000 mg | Freq: Once | INTRAVENOUS | Status: AC
  Administered 2019-07-20: 21:00:00 1000 mg via INTRAVENOUS
  Filled 2019-07-20: qty 200

## 2019-07-20 NOTE — ED Triage Notes (Signed)
Pt c/o increased pain and swelling to right leg, pt states that the Physical therapist sent pt to er for further evaluation,

## 2019-07-20 NOTE — ED Provider Notes (Signed)
[facility] Provider Note   CSN: [id number] Arrival date & time: 07/20/19  1424     History Chief Complaint  Patient presents with  . Leg Pain    [name] is a 74 y.o. male with a history as outlined below, most significant for bilateral varicose veins with varicose vein stripping years ago, with resultant right lower extremity lymphedema.  He has recently been seen for increased pain and swelling in his right leg and was placed on clindamycin on 2/25  but the antibiotic has not improved his symptoms.  He was seen by his physical therapist at his outpatient rehab appointment today who promptly sent him here due to concerns of expanding cellulitis.  He denies fevers or chills, he does endorse warmth and swelling which is tense to his right upper thigh, somewhat worse than his baseline swelling.  He also has a history of DVT, on Coumadin and reports has been stable and therapeutic, gets his INR checked routinely.    The history is provided by the patient and medical records.       Past Medical History:  Diagnosis Date  . Difficulty sleeping   . DVT (deep venous thrombosis) (HCC)    rt leg 3 yrs ago  . Hypertension   . Leg cramps   . Prostate cancer ([facility])   . Swelling of lower extremity    RT LEG  . Varicose veins    both legs    Patient Active Problem List   Diagnosis Date Noted  . Personal history of thromboembolic disease 123456  . Malnutrition of moderate degree ([facility]) 01/12/2014  . Pyelonephritis 01/10/2014  . Acute pyelonephritis 01/10/2014  . Prostate cancer ([facility]) 12/30/2013    Past Surgical History:  Procedure Laterality Date  . HERNIA REPAIR  2012   INGUINAL  . LYMPHADENECTOMY Bilateral 12/30/2013   Procedure: PELVIC LYMPH NODE DISSECTION;  Surgeon: Alexis Frock, MD;  Location: [facility];  Service: Urology;  Laterality: Bilateral;  . NASAL SINUS SURGERY    . ROBOT ASSISTED LAPAROSCOPIC RADICAL PROSTATECTOMY N/A 12/30/2013   Procedure: ROBOTIC ASSISTED LAPAROSCOPIC RADICAL PROSTATECTOMY WITH INDOCYANINE GREEN DYE;  Surgeon: Alexis Frock, MD;  Location: [facility];  Service: Urology;  Laterality: N/A;  . VEIN SURGERY     RT LEG       No family history on file.  Social History   Tobacco Use  . Smoking status: Never Smoker  . Smokeless tobacco: Never Used  Substance Use Topics  . Alcohol use: No  . Drug use: No    Home Medications Prior to Admission medications   Medication Sig Start Date End Date Taking? Authorizing Provider  allopurinol (ZYLOPRIM) 100 MG tablet Take 100 mg by mouth daily.    [provider]  amLODipine (NORVASC) 5 MG tablet Take 5 mg by mouth daily.    [provider]  ammonium lactate (AMLACTIN) 12 % cream Apply topically as needed for dry skin. 03/08/19   Trula Slade, DPM  ciclopirox (PENLAC) 8 % solution Apply topically at bedtime. Apply over nail and surrounding skin. Apply daily over previous coat. After seven (7) days, may remove with alcohol and continue cycle. 05/10/19   Trula Slade, DPM  clindamycin (CLEOCIN) 300 MG capsule Take 1 capsule (300 mg total) by mouth 3 (three) times daily. 07/14/19   Trula Slade, DPM  colchicine 0.6 MG tablet Take 0.6 mg by mouth daily.    [provider]  ferrous sulfate 325 (65 FE)  MG tablet Take 1 tablet (325 mg total) by mouth 3 (three) times daily with meals. 01/14/14   Charolette Forward, MD  furosemide (LASIX) 40 MG tablet Take 40 mg by mouth.    [provider]  hydrALAZINE (APRESOLINE) 25 MG tablet TAKE 1 TABLET BY MOUTH TWICE A DAY FOR HEART 02/28/19   [provider]  losartan (COZAAR) 100 MG tablet Take 100 mg by mouth daily.    [provider]  metoprolol tartrate (LOPRESSOR) 25 MG tablet Take by mouth. 02/18/17   [provider]  OLMESARTAN MEDOXOMIL PO Take by mouth. 08/09/18   [provider]  torsemide (DEMADEX) 20 MG tablet Take 20 mg by mouth daily.  02/03/19   [provider]  traMADol (ULTRAM) 50 MG tablet TAKE 1 TABLET BY MOUTH TWICE A DAY FOR PAIN 11/08/18   [provider]  triamcinolone cream (KENALOG) 0.1 % Apply 1 application topically 2 (two) times daily. 03/08/19   Trula Slade, DPM  valACYclovir (VALTREX) 500 MG tablet Take 500 mg by mouth daily. 06/10/19   [provider]  warfarin (COUMADIN) 7.5 MG tablet Take 7.5 mg by mouth daily.    [provider]    Allergies    Cephalexin and Mometasone  Review of Systems   Review of Systems  Constitutional: Negative for chills and fever.  HENT: Negative for congestion and sore throat.   Eyes: Negative.   Respiratory: Negative for chest tightness and shortness of breath.   Cardiovascular: Positive for leg swelling. Negative for chest pain.  Gastrointestinal: Negative for abdominal pain and nausea.  Genitourinary: Negative.   Musculoskeletal: Negative for arthralgias, joint swelling and neck pain.  Skin: Positive for color change. Negative for rash and wound.  Neurological: Negative for dizziness, weakness, light-headedness, numbness and headaches.  Psychiatric/Behavioral: Negative.     Physical Exam Updated Vital Signs BP (!) 166/75   Pulse 88   Temp 97.7 F (36.5 C) (Oral)   Resp 18   Ht 6\' 2"  (1.88 m)   Wt 122.5 kg   SpO2 100%   BMI 34.67 kg/m   Physical Exam Constitutional:      Appearance: He is well-developed.  HENT:     Head: Atraumatic.  Cardiovascular:     Comments: Pulses equal bilaterally Musculoskeletal:        General: Swelling and tenderness present.     Cervical back: Normal range of motion.     Comments: Bilateral lower extremity edema with the right leg being profoundly more edematous than the left.  There is brawny induration to his upper thigh associated with increased warmth and erythema.  His skin is dry, there are no wounds, no weeping.  Dorsalis pedis pulse is palpable.  Skin:    General: Skin is warm  and dry.  Neurological:     Mental Status: He is alert.     Sensory: No sensory deficit.     Deep Tendon Reflexes: Reflexes normal.     ED Results / Procedures / Treatments   Labs (all labs ordered are listed, but only abnormal results are displayed) Labs Reviewed  CBC WITH DIFFERENTIAL/PLATELET - Abnormal; Notable for the following components:      Result Value   WBC 11.5 (*)    RBC 3.23 (*)    Hemoglobin 9.0 (*)    HCT 29.6 (*)    RDW 16.4 (*)    nRBC 0.3 (*)    Neutro Abs 9.7 (*)    Abs Immature Granulocytes  0.10 (*)    All other components within normal limits  BASIC METABOLIC PANEL - Abnormal; Notable for the following components:   Glucose, Bld 106 (*)    BUN 29 (*)    Creatinine, Ser 1.49 (*)    GFR calc non Af Amer 45 (*)    GFR calc Af Amer 52 (*)    All other components within normal limits  PROTIME-INR - Abnormal; Notable for the following components:   Prothrombin Time 35.3 (*)    INR 3.5 (*)    All other components within normal limits  LACTIC ACID, PLASMA  LACTIC ACID, PLASMA    EKG None  Radiology No results found.  Procedures Procedures (including critical care time)  Medications Ordered in ED Medications  vancomycin (VANCOCIN) IVPB 1000 mg/200 mL premix (0 mg Intravenous Stopped 07/20/19 2230)    ED Course  I have reviewed the triage vital signs and the nursing notes.  Pertinent labs & imaging results that were available during my care of the patient were reviewed by me and considered in my medical decision making (see chart for details).    MDM Rules/Calculators/A&P                      Pt with chronic venous insufficiency bilateral lower extremities with severe right leg lymphadema, now with increased redness and warmth suggesting cellulitis. He has taken a 5 day course of clindamycin prescribed by his podiatrist which has not helped his sx.  He was strongly encouraged that he requires admission for IV abx for this infection. Also  discussed dvt study, although he is slightly supratherapeutic with his coumadin, INR 3.5 so dvt is less likely.    Pt refuses admission.  He was willing to receive IV dose of abx here, but prefers to see his pcp tomorrow in [place] for his opinion prior to getting admitted. Discussed possible worsening sx, worse infection, sepsis, loss of limb or life.  He understands the importance of f/u tomorrow.  He was signed out ama.  Pt was seen by Dr Roderic Palau during this visit.  Final Clinical Impression(s) / ED Diagnoses Final diagnoses:  Cellulitis of right lower extremity    Rx / DC Orders ED Discharge Orders    None       Landis Martins 07/21/19 [id number]    Milton Ferguson, MD 07/22/19 1156

## 2019-07-20 NOTE — Discharge Instructions (Addendum)
As discussed you are leaving against our medical advice as we feel you should be admitted to the hospital for additional IV antibiotics.  Return here if you change your mind about being admitted.  Do make sure you contact Dr. Doylene Canard tomorrow to discuss this problem and our recommendation that you be admitted to the hospital.

## 2019-07-20 NOTE — Therapy (Signed)
[place], [name], [address] Phone: [phone]   Fax:  [phone]  Physical Therapy Treatment  Patient Details  Name: [name] MRN: [id number] Date of Birth: 15-Apr-1946 Referring Provider (PT): Celesta Gentile   Encounter Date: 07/20/2019  PT End of Session - 07/20/19 1413    Visit Number  10    Number of Visits  18    Date for PT Re-Evaluation  08/02/19    Authorization Type  [employer] medicate cert good until 0000000    Authorization - Visit Number  10    Authorization - Number of Visits  18    Progress Note Due on Visit  11    PT Start Time  1330    PT Stop Time  1345    PT Time Calculation (min)  15 min       Past Medical History:  Diagnosis Date  . Difficulty sleeping   . DVT (deep venous thrombosis) (HCC)    rt leg 3 yrs ago  . Hypertension   . Leg cramps   . Prostate cancer ([place])   . Swelling of lower extremity    RT LEG  . Varicose veins    both legs    Past Surgical History:  Procedure Laterality Date  . HERNIA REPAIR  2012   INGUINAL  . LYMPHADENECTOMY Bilateral 12/30/2013   Procedure: PELVIC LYMPH NODE DISSECTION;  Surgeon: Alexis Frock, MD;  Location: [facility];  Service: Urology;  Laterality: Bilateral;  . NASAL SINUS SURGERY    . ROBOT ASSISTED LAPAROSCOPIC RADICAL PROSTATECTOMY N/A 12/30/2013   Procedure: ROBOTIC ASSISTED LAPAROSCOPIC RADICAL PROSTATECTOMY WITH INDOCYANINE GREEN DYE;  Surgeon: Alexis Frock, MD;  Location: [facility];  Service: Urology;  Laterality: N/A;  . VEIN SURGERY     RT LEG    There were no vitals filed for this visit.  Subjective Assessment - 07/20/19 1411    Subjective  PT states that he tried to go to the ER in [place] and they said to make a MD appointment.  PT comes to department today stating, "My right leg is messed up".  Pt started oral antibiotics but they have not done anything.  Pt states that he is not eating or drinking much.    Currently in  Pain?  Yes    Pain Score  6     Pain Location  Leg    Pain Orientation  Left    Pain Descriptors / Indicators  Aching;Throbbing    Pain Type  Acute pain    Pain Onset  In the past 7 days    Pain Frequency  Constant    Aggravating Factors   walking    Pain Relieving Factors  elevating            LYMPHEDEMA/ONCOLOGY QUESTIONNAIRE - 07/20/19 1335      Right Lower Extremity Lymphedema   20 cm Proximal to Suprapatella  71 cm   was 66.3   10 cm Proximal to Suprapatella  67.4 cm   was 65.5   At Midpatella/Popliteal Crease  60.5 cm   was 56   30 cm Proximal to Floor at Lateral Plantar Foot  54 cm   was 55   20 cm Proximal to Floor at Lateral Plantar Foot  48.3 1   was at smallest on2/2 45.5 ; 2/24 47   10 cm Proximal to Floor at Lateral Malleoli  39.3 cm   on 2/17 was 37.2; on 2/24  was 39.7   Circumference of ankle/heel  45.3 cm.   on 2/17 41.1; on 2/24 was 43   5 cm Proximal to 1st MTP Joint  33 cm   on 2/17 was 27.7; 2/24 was 33   Across MTP Joint  31.3 cm   2/17 was 27.5; 2/24 was 30      Left Lower Extremity Lymphedema   20 cm Proximal to Suprapatella  61.9 cm   was 61   10 cm Proximal to Suprapatella  56.8 cm   was 53   At Midpatella/Popliteal Crease  48.8 cm   was 49.5   30 cm Proximal to Floor at Lateral Plantar Foot  48.8 cm   was 48.5   20 cm Proximal to Floor at Lateral Plantar Foot  47 cm   was 46.5   10 cm Proximal to Floor at Lateral Malleoli  39.4 cm   was 37.3   Circumference of ankle/heel  44 cm.   was 42   5 cm Proximal to 1st MTP Joint  28.8 cm   was 28   Across MTP Joint  28 cm   was 28                         PT Short Term Goals - 06/29/19 1726      PT SHORT TERM GOAL #1   Title  Pt to be I in HEP to improve lymphatic circulation to assist in decreasing volumes.    Time  3    Period  Weeks    Status  On-going    Target Date  07/12/19      PT SHORT TERM GOAL #2   Title  Pt to have lost 5 cm volume from RT LE , 3  from Lt to allow pt to be able to fit in more of his shoes.    Time  3    Period  Weeks    Status  On-going        PT Long Term Goals - 06/29/19 1726      PT LONG TERM GOAL #1   Title  Pt Wound on Rt LE to be healed    Time  6    Period  Weeks    Status  On-going      PT LONG TERM GOAL #2   Title  PT to have lost 8 cm volume from RT LE, 4 from Lt to allow pt  to be able to walk for 20 minutes at a time without difficulty.    Time  6    Period  Weeks    Status  On-going      PT LONG TERM GOAL #3   Title  PT to have and be using a compression pump; order faxed to MD    Time  6    Period  Weeks    Status  On-going            Plan - 07/20/19 1415    Clinical Impression Statement  Therapist emphasized the importance of the pt going to the Emergency room.  Therapist explained that his leg is swollen hot and red all signs of cellulitis which is a serious condition.  Pt agrees to go to [facility] ER.    Personal Factors and Comorbidities  Time since onset of injury/illness/exacerbation    Examination-Activity Limitations  Bathing;Carry;Dressing;Lift;Locomotion Level;Stairs    Examination-Participation Restrictions  Community Activity;Yard Work    Stability/Clinical  Decision Making  Evolving/Moderate complexity    Rehab Potential  Good    PT Frequency  3x / week    PT Duration  6 weeks    PT Treatment/Interventions  Manual lymph drainage;Patient/family education;Compression bandaging;Therapeutic exercise    PT Next Visit Plan  Today was suppose to be 10th visit reassessment however pt has not had bandages on due to pain and most likely has cellulitis.  We have sent the pt to the ER and we will formally reassess next visit.    PT Home Exercise Plan  LE to improve lymphatic circulation.       Patient will benefit from skilled therapeutic intervention in order to improve the following deficits and impairments:  Abnormal gait, Decreased activity tolerance, Difficulty walking, Decreased  skin integrity, Increased edema  Visit Diagnosis: Lymphedema, not elsewhere classified  Difficulty in walking, not elsewhere classified  Leg wound, right, initial encounter     Problem List Patient Active Problem List   Diagnosis Date Noted  . Personal history of thromboembolic disease 123456  . Malnutrition of moderate degree ([place]) 01/12/2014  . Pyelonephritis 01/10/2014  . Acute pyelonephritis 01/10/2014  . Prostate cancer [facility]) 12/30/2013    [name], PT CLT [phone] 07/20/2019, 2:19 PM  [facility] [address] [place], [name], [address] Phone: [phone]   Fax:  [phone]  Name: Shirley Dudoit MRN: [id number] Date of Birth: [birth year]

## 2019-07-21 ENCOUNTER — Ambulatory Visit (HOSPITAL_COMMUNITY): Payer: Medicare Other | Admitting: Physical Therapy

## 2019-07-25 ENCOUNTER — Telehealth (HOSPITAL_COMMUNITY): Payer: Self-pay | Admitting: Physical Therapy

## 2019-07-25 ENCOUNTER — Ambulatory Visit (HOSPITAL_COMMUNITY): Payer: Medicare Other | Admitting: Physical Therapy

## 2019-07-25 NOTE — Telephone Encounter (Signed)
Pt did not show for appointment today.  Called and spoke to patient regarding severity of cellulitis and need for antibiotics to improve.  States his leg is getting better following the IV antibiotic he received in the ED last week and the use of the pills and elevation.  Recommended pt to follow up with MD to ensure it is OKay to return to therapy.    Teena Irani, PTA/CLT [phone]

## 2019-07-26 ENCOUNTER — Telehealth (HOSPITAL_COMMUNITY): Payer: Self-pay | Admitting: Physical Therapy

## 2019-07-26 NOTE — Telephone Encounter (Signed)
pt cancelled appt for tomorrow stating he needs to go to the ER first

## 2019-07-27 ENCOUNTER — Encounter (HOSPITAL_COMMUNITY): Payer: Self-pay

## 2019-07-27 ENCOUNTER — Ambulatory Visit (HOSPITAL_COMMUNITY): Payer: Medicare Other | Admitting: Physical Therapy

## 2019-07-27 ENCOUNTER — Emergency Department (HOSPITAL_COMMUNITY)
Admission: EM | Admit: 2019-07-27 | Discharge: 2019-07-27 | Disposition: A | Discharge status: Home / Self Care | Payer: Medicare Other | Attending: Emergency Medicine | Admitting: Emergency Medicine

## 2019-07-27 ENCOUNTER — Other Ambulatory Visit: Payer: Self-pay

## 2019-07-27 DIAGNOSIS — M7989 Other specified soft tissue disorders: Secondary | ICD-10-CM

## 2019-07-27 DIAGNOSIS — R791 Abnormal coagulation profile: Secondary | ICD-10-CM | POA: Diagnosis not present

## 2019-07-27 DIAGNOSIS — Z79899 Other long term (current) drug therapy: Secondary | ICD-10-CM | POA: Diagnosis not present

## 2019-07-27 DIAGNOSIS — L03115 Cellulitis of right lower limb: Secondary | ICD-10-CM | POA: Insufficient documentation

## 2019-07-27 DIAGNOSIS — I10 Essential (primary) hypertension: Secondary | ICD-10-CM | POA: Diagnosis not present

## 2019-07-27 DIAGNOSIS — R2241 Localized swelling, mass and lump, right lower limb: Secondary | ICD-10-CM | POA: Diagnosis present

## 2019-07-27 LAB — BASIC METABOLIC PANEL
Anion gap: 7 (ref 5–15)
BUN: 29 mg/dL — ABNORMAL HIGH (ref 8–23)
CO2: 23 mmol/L (ref 22–32)
Calcium: 8.9 mg/dL (ref 8.9–10.3)
Chloride: 110 mmol/L (ref 98–111)
Creatinine, Ser: 1.45 mg/dL — ABNORMAL HIGH (ref 0.61–1.24)
GFR calc Af Amer: 54 mL/min — ABNORMAL LOW (ref >60)
GFR calc non Af Amer: 47 mL/min — ABNORMAL LOW (ref >60)
Glucose, Bld: 94 mg/dL (ref 70–99)
Potassium: 4.7 mmol/L (ref 3.5–5.1)
Sodium: 140 mmol/L (ref 135–145)

## 2019-07-27 LAB — PROTIME-INR
INR: 4.2 (ref 0.8–1.2)
Prothrombin Time: 40.5 seconds — ABNORMAL HIGH (ref 11.4–15.2)

## 2019-07-27 LAB — CBC
HCT: 29.4 % — ABNORMAL LOW (ref 39.0–52.0)
Hemoglobin: 8.8 g/dL — ABNORMAL LOW (ref 13.0–17.0)
MCH: 28.2 pg (ref 26.0–34.0)
MCHC: 29.9 g/dL — ABNORMAL LOW (ref 30.0–36.0)
MCV: 94.2 fL (ref 80.0–100.0)
Platelets: 361 10*3/uL (ref 150–400)
RBC: 3.12 MIL/uL — ABNORMAL LOW (ref 4.22–5.81)
RDW: 16.1 % — ABNORMAL HIGH (ref 11.5–15.5)
WBC: 6.1 10*3/uL (ref 4.0–10.5)
nRBC: 0 % (ref 0.0–0.2)

## 2019-07-27 LAB — BRAIN NATRIURETIC PEPTIDE: B Natriuretic Peptide: 42 pg/mL (ref 0.0–100.0)

## 2019-07-27 NOTE — ED Triage Notes (Signed)
Pt presents to ED with increased pain and swelling to right leg.

## 2019-07-27 NOTE — Discharge Instructions (Signed)
Please call your primary care doctor to get a follow-up appointment regarding your leg swelling, redness as well as your elevated Coumadin level.  Please discuss further doses of your Coumadin level with them.  You should call them first thing tomorrow morning to discuss.  If you are not able to easily get a hold of them in a timely fashion, then I would recommend taking 1/2 tablet tomorrow and resuming regular doses afterwards.  You should have your legs rechecked as well as your Coumadin levels rechecked closely with your primary care doctor.  Ideally should be seen on Friday or on Monday.  You may return to physical therapy.  Return to ER if you feel your swelling, redness worsens or you develop a fever.

## 2019-07-27 NOTE — ED Notes (Signed)
Date and time results received: 07/27/19 1913   Test: INR Critical Value: 4.2  Name of Provider Notified: Roslynn Amble, MD

## 2019-07-27 NOTE — ED Notes (Signed)
ED Provider at bedside. 

## 2019-07-28 NOTE — ED Provider Notes (Signed)
[facility] EMERGENCY DEPARTMENT Provider Note   CSN: [id number] Arrival date & time: 07/27/19  [id number]     History Chief Complaint  Patient presents with  . Leg Swelling    [name] is a 74 y.o. male.  Presents to ER with chief complaint leg infection.  Patient is chronic leg swelling in his right leg.  States that he has had no change in his right leg swelling.  Diagnosed with a skin infection and was started on clindamycin.  Patient reports that he feels the area of redness on his leg has significantly improved.  There is no worsening of the swelling, he has no associated pain.  He reports that his physical therapist told him he may need IV antibiotics and needed his infection treated prior to resuming therapy.  Past medical history of DVT on Coumadin.  Completed chart review ER visit on March 3, concern for cellulitis started on IV clindamycin and recommended admission but patient wanted to pursue outpatient therapy, given clindamycin p.o.  Physical therapy assistant telephone note from March 8 where recommended patient follow-up with his primary doctor to ensure he is okay to return to clinic.  HPI     Past Medical History:  Diagnosis Date  . Difficulty sleeping   . DVT (deep venous thrombosis) (HCC)    rt leg 3 yrs ago  . Hypertension   . Leg cramps   . Prostate cancer ([place])   . Swelling of lower extremity    RT LEG  . Varicose veins    both legs    Patient Active Problem List   Diagnosis Date Noted  . Personal history of thromboembolic disease 123456  . Malnutrition of moderate degree ([place]) 01/12/2014  . Pyelonephritis 01/10/2014  . Acute pyelonephritis 01/10/2014  . Prostate cancer ([place]) 12/30/2013    Past Surgical History:  Procedure Laterality Date  . HERNIA REPAIR  2012   INGUINAL  . LYMPHADENECTOMY Bilateral 12/30/2013   Procedure: PELVIC LYMPH NODE DISSECTION;  Surgeon: Alexis Frock, MD;  Location: [facility];  Service: Urology;  Laterality:  Bilateral;  . NASAL SINUS SURGERY    . ROBOT ASSISTED LAPAROSCOPIC RADICAL PROSTATECTOMY N/A 12/30/2013   Procedure: ROBOTIC ASSISTED LAPAROSCOPIC RADICAL PROSTATECTOMY WITH INDOCYANINE GREEN DYE;  Surgeon: Alexis Frock, MD;  Location: [facility];  Service: Urology;  Laterality: N/A;  . VEIN SURGERY     RT LEG       No family history on file.  Social History   Tobacco Use  . Smoking status: Never Smoker  . Smokeless tobacco: Never Used  Substance Use Topics  . Alcohol use: No  . Drug use: No    Home Medications Prior to Admission medications   Medication Sig Start Date End Date Taking? Authorizing Provider  allopurinol (ZYLOPRIM) 100 MG tablet Take 100 mg by mouth daily.    [provider]  amLODipine (NORVASC) 5 MG tablet Take 5 mg by mouth daily.    [provider]  ammonium lactate (AMLACTIN) 12 % cream Apply topically as needed for dry skin. 03/08/19   Trula Slade, DPM  ciclopirox (PENLAC) 8 % solution Apply topically at bedtime. Apply over nail and surrounding skin. Apply daily over previous coat. After seven (7) days, may remove with alcohol and continue cycle. 05/10/19   Trula Slade, DPM  clindamycin (CLEOCIN) 300 MG capsule Take 1 capsule (300 mg total) by mouth 3 (three) times daily. 07/14/19   Trula Slade, DPM  colchicine 0.6 MG tablet  Take 0.6 mg by mouth daily.    [provider]  ferrous sulfate 325 (65 FE) MG tablet Take 1 tablet (325 mg total) by mouth 3 (three) times daily with meals. 01/14/14   Charolette Forward, MD  furosemide (LASIX) 40 MG tablet Take 40 mg by mouth.    [provider]  hydrALAZINE (APRESOLINE) 25 MG tablet TAKE 1 TABLET BY MOUTH TWICE A DAY FOR HEART 02/28/19   [provider]  losartan (COZAAR) 100 MG tablet Take 100 mg by mouth daily.    [provider]  metoprolol tartrate (LOPRESSOR) 25 MG tablet Take by mouth. 02/18/17   [provider]  OLMESARTAN MEDOXOMIL PO Take  by mouth. 08/09/18   [provider]  torsemide (DEMADEX) 20 MG tablet Take 20 mg by mouth daily. 02/03/19   [provider]  traMADol (ULTRAM) 50 MG tablet TAKE 1 TABLET BY MOUTH TWICE A DAY FOR PAIN 11/08/18   [provider]  triamcinolone cream (KENALOG) 0.1 % Apply 1 application topically 2 (two) times daily. 03/08/19   Trula Slade, DPM  valACYclovir (VALTREX) 500 MG tablet Take 500 mg by mouth daily. 06/10/19   [provider]  warfarin (COUMADIN) 7.5 MG tablet Take 7.5 mg by mouth daily.    [provider]    Allergies    Cephalexin and Mometasone  Review of Systems   Review of Systems  Constitutional: Negative for chills and fever.  HENT: Negative for ear pain and sore throat.   Eyes: Negative for pain and visual disturbance.  Respiratory: Negative for cough and shortness of breath.   Cardiovascular: Negative for chest pain and palpitations.  Gastrointestinal: Negative for abdominal pain and vomiting.  Genitourinary: Negative for dysuria and hematuria.  Musculoskeletal: Negative for arthralgias and back pain.  Skin: Negative for color change and rash.  Neurological: Negative for seizures and syncope.  All other systems reviewed and are negative.   Physical Exam Updated Vital Signs BP (!) 175/84 (BP Location: Left Arm)   Pulse 94   Temp (!) 97.5 F (36.4 C) (Oral)   Resp 16   Ht 6\' 2"  (1.88 m)   Wt 113.4 kg   SpO2 100%   BMI 32.10 kg/m   Physical Exam Vitals and nursing note reviewed.  Constitutional:      Appearance: He is well-developed.  HENT:     Head: Normocephalic and atraumatic.  Eyes:     Conjunctiva/sclera: Conjunctivae normal.  Cardiovascular:     Rate and Rhythm: Normal rate and regular rhythm.     Heart sounds: No murmur.  Pulmonary:     Effort: Pulmonary effort is normal. No respiratory distress.     Breath sounds: Normal breath sounds.  Abdominal:     Palpations: Abdomen is soft.      Tenderness: There is no abdominal tenderness.  Musculoskeletal:     Cervical back: Neck supple.     Comments: RLE: profound chronic lower extremity edema that extends from foot to upper leg, no significant overlying erythema appreciated, no warmth to touch, no pain with ROM  Skin:    General: Skin is warm and dry.     Capillary Refill: Capillary refill takes less than 2 seconds.  Neurological:     General: No focal deficit present.     Mental Status: He is alert and oriented to person, place, and time.     ED Results / Procedures / Treatments   Labs (all labs ordered are listed, but  only abnormal results are displayed) Labs Reviewed  PROTIME-INR - Abnormal; Notable for the following components:      Result Value   Prothrombin Time 40.5 (*)    INR 4.2 (*)    All other components within normal limits  CBC - Abnormal; Notable for the following components:   RBC 3.12 (*)    Hemoglobin 8.8 (*)    HCT 29.4 (*)    MCHC 29.9 (*)    RDW 16.1 (*)    All other components within normal limits  BASIC METABOLIC PANEL - Abnormal; Notable for the following components:   BUN 29 (*)    Creatinine, Ser 1.45 (*)    GFR calc non Af Amer 47 (*)    GFR calc Af Amer 54 (*)    All other components within normal limits  BRAIN NATRIURETIC PEPTIDE    EKG None  Radiology No results found.  Procedures Procedures (including critical care time)  Medications Ordered in ED Medications - No data to display  ED Course  I have reviewed the triage vital signs and the nursing notes.  Pertinent labs & imaging results that were available during my care of the patient were reviewed by me and considered in my medical decision making (see chart for details).    MDM Rules/Calculators/A&P                      74 year old male who presented to ER for reevaluation of his right lower leg cellulitis.  Per patient the area of redness has significantly improved since starting the clindamycin.  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  Check INR level as it was mildly elevated last time and it continues to be elevated today.  He is afebrile with normal vital signs and no leukocytosis.  Recommended the patient continue his course of clindamycin as previously prescribed.  Regarding his elevated INR, instructed patient to call his primary doctor or Coumadin clinic tomorrow morning to discuss further dosing, if he is unable to reach them in a prompt fashion then recommended patient to take half tablet tomorrow.  Reviewed return precautions with patient should he have any worsening of swelling or erythema or fever. Given clinical improvement, believe patient can continue out pt therapy. Believe patient is appropriate to continue his regular PT/OT services.     After the discussed management above, the patient was determined to be safe for discharge.  The patient was in agreement with this plan and all questions regarding their care were answered.  ED return precautions were discussed and the patient will return to the ED with any significant worsening of condition.   Final Clinical Impression(s) / ED Diagnoses Final diagnoses:  Leg swelling  Cellulitis of right lower extremity  Supratherapeutic INR    Rx / DC Orders ED Discharge Orders    None       Lucrezia Starch, MD 07/28/19 1524

## 2019-07-29 ENCOUNTER — Encounter (HOSPITAL_COMMUNITY): Payer: Self-pay | Admitting: Physical Therapy

## 2019-07-29 ENCOUNTER — Ambulatory Visit (HOSPITAL_COMMUNITY): Payer: Medicare Other | Admitting: Physical Therapy

## 2019-07-29 ENCOUNTER — Other Ambulatory Visit: Payer: Self-pay

## 2019-07-29 DIAGNOSIS — R262 Difficulty in walking, not elsewhere classified: Secondary | ICD-10-CM | POA: Diagnosis present

## 2019-07-29 DIAGNOSIS — I890 Lymphedema, not elsewhere classified: Secondary | ICD-10-CM

## 2019-07-29 DIAGNOSIS — S81801A Unspecified open wound, right lower leg, initial encounter: Secondary | ICD-10-CM

## 2019-07-29 NOTE — Therapy (Signed)
[facility] [address] [place], [name], [address] Phone: [phone]   Fax:  [phone]  Physical Therapy Treatment  Patient Details  Name: [name] MRN: [id number] Date of Birth: October 25, 1945 Referring Provider (PT): Celesta Gentile  Progress Note Reporting Period 06/21/2019  to 08/29/2019  See note below for Objective Data and Assessment of Progress/Goals.      Encounter Date: 07/29/2019  PT End of Session - 07/29/19 1616    Visit Number  11    Number of Visits  18    Date for PT Re-Evaluation  08/02/19    Authorization Type  [employer] medicate cert good until 0000000    Progress Note Due on Visit  18    PT Start Time  1315    PT Stop Time  1445    PT Time Calculation (min)  90 min    Activity Tolerance  Patient tolerated treatment well    Behavior During Therapy  W[facility] for tasks assessed/performed       Past Medical History:  Diagnosis Date  . Difficulty sleeping   . DVT (deep venous thrombosis) (HCC)    rt leg 3 yrs ago  . Hypertension   . Leg cramps   . Prostate cancer ([place])   . Swelling of lower extremity    RT LEG  . Varicose veins    both legs    Past Surgical History:  Procedure Laterality Date  . HERNIA REPAIR  2012   INGUINAL  . LYMPHADENECTOMY Bilateral 12/30/2013   Procedure: PELVIC LYMPH NODE DISSECTION;  Surgeon: Alexis Frock, MD;  Location: [facility];  Service: Urology;  Laterality: Bilateral;  . NASAL SINUS SURGERY    . ROBOT ASSISTED LAPAROSCOPIC RADICAL PROSTATECTOMY N/A 12/30/2013   Procedure: ROBOTIC ASSISTED LAPAROSCOPIC RADICAL PROSTATECTOMY WITH INDOCYANINE GREEN DYE;  Surgeon: Alexis Frock, MD;  Location: [facility];  Service: Urology;  Laterality: N/A;  . VEIN SURGERY     RT LEG    There were no vitals filed for this visit.  Subjective Assessment - 07/29/19 1611    Subjective  Pt states that he went to the ER, they wanted to keep him but he did not want his wife to worry and drive in the dark  therefore he just had one bout of IV antibiotic and went home.  He went back for a recheck and was prepared to be admittied, even brought his toothbrush but they said that he did not have to be admitted to just keep taking the prescription antibiotics that he had been given.  Pt states that MD stated that he could resume lymphedema treatment.    Pertinent History  bulge disc in his back,  hx of DVT in RT LE, HTN, prostate cancer, CVI, lymphedema    Limitations  Walking;House hold activities    How long can you sit comfortably?  no problem    How long can you walk comfortably?  5 min    Patient Stated Goals  To get the fluid out of his legs    Pain Onset  In the past 7 days            LYMPHEDEMA/ONCOLOGY QUESTIONNAIRE - 07/29/19 1614      Right Lower Extremity Lymphedema   30 cm Proximal to Suprapatella  67.5 cm    20 cm Proximal to Suprapatella  70.8 cm   was 69.8   10 cm Proximal to Suprapatella  63.7 cm   was67   At Midpatella/Popliteal  Crease  56.7 cm   58   30 cm Proximal to Floor at Lateral Plantar Foot  53.4 cm   51.8   20 cm Proximal to Floor at Lateral Plantar Foot  45.4 1   45.5   10 cm Proximal to Floor at Lateral Malleoli  37 cm   38.8   Circumference of ankle/heel  43 cm.   41.8   5 cm Proximal to 1st MTP Joint  29.8 cm   28.3   Across MTP Joint  28.3 cm   28.3   Around Proximal Great Toe  13 cm   13.7     Left Lower Extremity Lymphedema   20 cm Proximal to Suprapatella  61 cm   was 64   10 cm Proximal to Suprapatella  55 cm   58.5   At Midpatella/Popliteal Crease  48.6 cm   52.7   30 cm Proximal to Floor at Lateral Plantar Foot  46.8 cm   50.4   20 cm Proximal to Floor at Lateral Plantar Foot  42.8 cm   46.3   10 cm Proximal to Floor at Lateral Malleoli  38.8 cm   39   Circumference of ankle/heel  42 cm.   41.3   5 cm Proximal to 1st MTP Joint  26.7 cm   27.8   Across MTP Joint  28 cm   29.3               [facility] Adult PT  Treatment/Exercise - 07/29/19 0001      Manual Therapy   Manual Therapy  Compression Bandaging    Manual therapy comments  completed in supine and prone    Manual Lymphatic Drainage (MLD)  short neck, deep and superficial abdominals, lumbar, inguinal/axillary anastomosis followe by  bil inguinal-axillary anastomosis for Lt LE only.    Compression Bandaging  multiple short stretch and 1/2" foam for distal bil LE's including toe bandaging to thigh on Lt, to knee only on RT due to thigh on RT still being uncomfortable.     Other Manual Therapy  silverhydrofiber placed on wound on medial aspect of Rt LE 1x1 cm prior to bandaging                PT Short Term Goals - 07/29/19 1625      PT SHORT TERM GOAL #1   Title  Pt to be I in HEP to improve lymphatic circulation to assist in decreasing volumes.    Time  3    Period  Weeks    Status  Achieved    Target Date  07/12/19      PT SHORT TERM GOAL #2   Title  Pt to have lost 5 cm volume from RT LE , 3 from Lt to allow pt to be able to fit in more of his shoes.    Time  3    Period  Weeks    Status  On-going        PT Long Term Goals - 07/29/19 1626      PT LONG TERM GOAL #1   Title  Pt Wound on Rt LE to be healed    Time  6    Period  Weeks    Status  On-going      PT LONG TERM GOAL #2   Title  PT to have lost 8 cm volume from RT LE, 4 from Lt to allow pt  to be able to walk for 20  minutes at a time without difficulty.    Time  6    Period  Weeks    Status  On-going      PT LONG TERM GOAL #3   Title  PT to have and be using a compression pump; order faxed to MD    Time  6    Period  Weeks    Status  On-going            Plan - 07/29/19 1617    Clinical Impression Statement  Pt returns he has not had formal therapy since 07/14/2019 due to having cellulitis in his RT LE.  Pt has missed a week of therapy.  He was remeasured, his measurements overall are lower than when he first started, however they are above there  lowest recorded measurements.  Pt will continut to benefit from skilled Physical therapy services.  Pt comes to department stating that his leg is still swollen but feels much better since antibiotics, no heat, or pain and redness has decreased significantly.   Pt Edema is less than on 07/20/2019 with therapist measured LE's and then sent pt to ER.    Personal Factors and Comorbidities  Time since onset of injury/illness/exacerbation    Examination-Activity Limitations  Bathing;Carry;Dressing;Lift;Locomotion Level;Stairs    Examination-Participation Restrictions  Community Activity;Yard Work    Stability/Clinical Decision Making  Evolving/Moderate complexity    Rehab Potential  Good    PT Frequency  3x / week    PT Duration  8 weeks    PT Treatment/Interventions  Manual lymph drainage;Patient/family education;Compression bandaging;Therapeutic exercise    PT Next Visit Plan  continue with total decongestive technique.    PT Home Exercise Plan  LE to improve lymphatic circulation.       Patient will benefit from skilled therapeutic intervention in order to improve the following deficits and impairments:  Abnormal gait, Decreased activity tolerance, Difficulty walking, Decreased skin integrity, Increased edema  Visit Diagnosis: Difficulty in walking, not elsewhere classified  Leg wound, right, initial encounter  Lymphedema, not elsewhere classified     Problem List Patient Active Problem List   Diagnosis Date Noted  . Personal history of thromboembolic disease 123456  . Malnutrition of moderate degree ([place]) 01/12/2014  . Pyelonephritis 01/10/2014  . Acute pyelonephritis 01/10/2014  . Prostate cancer [facility]) 12/30/2013  [name], PT CLT [phone]  07/29/2019, 4:28 PM  [facility] [address] [place], [name], [address] Phone: [phone]   Fax:  [phone]  Name: [name] MRN: [id number] Date of Birth:  Mar 21, 1946

## 2019-08-01 ENCOUNTER — Ambulatory Visit (HOSPITAL_COMMUNITY): Payer: Medicare Other | Admitting: Physical Therapy

## 2019-08-01 ENCOUNTER — Other Ambulatory Visit: Payer: Self-pay

## 2019-08-01 DIAGNOSIS — R262 Difficulty in walking, not elsewhere classified: Secondary | ICD-10-CM

## 2019-08-01 DIAGNOSIS — S81801A Unspecified open wound, right lower leg, initial encounter: Secondary | ICD-10-CM

## 2019-08-01 DIAGNOSIS — I890 Lymphedema, not elsewhere classified: Secondary | ICD-10-CM

## 2019-08-01 NOTE — Therapy (Signed)
[facility] [place], [name], [address] Phone: [phone]   Fax:  [phone]  Physical Therapy Treatment  Patient Details  Name: [name] MRN: [id number] Date of Birth: 1945-10-23 Referring Provider (PT): Celesta Gentile   Encounter Date: 08/01/2019  PT End of Session - 08/01/19 1613    Visit Number  12    Number of Visits  18    Date for PT Re-Evaluation  08/02/19    Authorization Type  [employer] medicate cert good until 0000000    Progress Note Due on Visit  18    PT Start Time  1315    PT Stop Time  1445    PT Time Calculation (min)  90 min    Activity Tolerance  Patient tolerated treatment well    Behavior During Therapy  [facility] for tasks assessed/performed       Past Medical History:  Diagnosis Date  . Difficulty sleeping   . DVT (deep venous thrombosis) (HCC)    rt leg 3 yrs ago  . Hypertension   . Leg cramps   . Prostate cancer ([place])   . Swelling of lower extremity    RT LEG  . Varicose veins    both legs    Past Surgical History:  Procedure Laterality Date  . HERNIA REPAIR  2012   INGUINAL  . LYMPHADENECTOMY Bilateral 12/30/2013   Procedure: PELVIC LYMPH NODE DISSECTION;  Surgeon: Alexis Frock, MD;  [facility];  Service: Urology;  Laterality: Bilateral;  . NASAL SINUS SURGERY    . ROBOT ASSISTED LAPAROSCOPIC RADICAL PROSTATECTOMY N/A 12/30/2013   Procedure: ROBOTIC ASSISTED LAPAROSCOPIC RADICAL PROSTATECTOMY WITH INDOCYANINE GREEN DYE;  Surgeon: Alexis Frock, MD;  [facility];  Service: Urology;  Laterality: N/A;  . VEIN SURGERY     RT LEG    There were no vitals filed for this visit.  Subjective Assessment - 08/01/19 1610    Subjective  PT states that he has a couple of days left of his antibiotic and his Rt leg is still bothering him.  He is going to see if his primary MD will give him another refill to make sure that all of the infection is gone.    Pertinent History  bulge disc in  his back,  hx of DVT in RT LE, HTN, prostate cancer, CVI, lymphedema    Limitations  Walking;House hold activities    How long can you sit comfortably?  no problem    How long can you walk comfortably?  5 min    Patient Stated Goals  To get the fluid out of his legs    Pain Onset  In the past 7 days                       [facility] Adult PT Treatment/Exercise - 08/01/19 0001      Manual Therapy   Manual Therapy  Manual Lymphatic Drainage (MLD);Compression Bandaging    Manual therapy comments  completed in supine and prone    Manual Lymphatic Drainage (MLD)  short neck, deep and superficial abdominals, lumbar, inguinal/axillary anastomosis followe by  bil inguinal-axillary anastomosis for Lt LE only.    Compression Bandaging  multiple short stretch and 1/2" foam for distal bil LE's including toe bandaging to thigh on Lt, Rt to thigh but only isoband and two short stretch used on thigh to see pt tolerance  PT Short Term Goals - 07/29/19 1625      PT SHORT TERM GOAL #1   Title  Pt to be I in HEP to improve lymphatic circulation to assist in decreasing volumes.    Time  3    Period  Weeks    Status  Achieved    Target Date  07/12/19      PT SHORT TERM GOAL #2   Title  Pt to have lost 5 cm volume from RT LE , 3 from Lt to allow pt to be able to fit in more of his shoes.    Time  3    Period  Weeks    Status  On-going        PT Long Term Goals - 07/29/19 1626      PT LONG TERM GOAL #1   Title  Pt Wound on Rt LE to be healed    Time  6    Period  Weeks    Status  On-going      PT LONG TERM GOAL #2   Title  PT to have lost 8 cm volume from RT LE, 4 from Lt to allow pt  to be able to walk for 20 minutes at a time without difficulty.    Time  6    Period  Weeks    Status  On-going      PT LONG TERM GOAL #3   Title  PT to have and be using a compression pump; order faxed to MD    Time  6    Period  Weeks    Status  On-going             Plan - 08/01/19 1614    Clinical Impression Statement  PT Lt LE has decreased induration this session.  Rt LE remains indurated and still has slight redness, no pain and improved since prior to the antibiotic.  Therapist only used isoband, knee and one thigh short stretch this session to see how pt tolerates compression on his thigh.    Personal Factors and Comorbidities  Time since onset of injury/illness/exacerbation    Examination-Activity Limitations  Bathing;Carry;Dressing;Lift;Locomotion Level;Stairs    Examination-Participation Restrictions  Community Activity;Yard Work    Stability/Clinical Decision Making  Evolving/Moderate complexity    Rehab Potential  Good    PT Frequency  3x / week    PT Duration  8 weeks    PT Treatment/Interventions  Manual lymph drainage;Patient/family education;Compression bandaging;Therapeutic exercise    PT Next Visit Plan  Recert, if pt still has redness emphasis the importance of obtaining more antibiotics.    PT Home Exercise Plan  LE to improve lymphatic circulation.       Patient will benefit from skilled therapeutic intervention in order to improve the following deficits and impairments:  Abnormal gait, Decreased activity tolerance, Difficulty walking, Decreased skin integrity, Increased edema  Visit Diagnosis: Difficulty in walking, not elsewhere classified  Leg wound, right, initial encounter  Lymphedema, not elsewhere classified     Problem List Patient Active Problem List   Diagnosis Date Noted  . Personal history of thromboembolic disease 123456  . Malnutrition of moderate degree ([place]) 01/12/2014  . Pyelonephritis 01/10/2014  . Acute pyelonephritis 01/10/2014  . Prostate cancer [facility]) 12/30/2013    [name], PT CLT [phone] 08/01/2019, 4:19 PM  [facility] [address] [place], [name], [address] Phone: [phone]   Fax:  [phone]  Name: [name]  [name] MRN: [id number] Date of Birth: February 19, 1946

## 2019-08-03 ENCOUNTER — Ambulatory Visit (HOSPITAL_COMMUNITY): Payer: Medicare Other | Admitting: Physical Therapy

## 2019-08-03 ENCOUNTER — Encounter (HOSPITAL_COMMUNITY): Payer: Self-pay | Admitting: Physical Therapy

## 2019-08-03 ENCOUNTER — Other Ambulatory Visit: Payer: Self-pay

## 2019-08-03 DIAGNOSIS — I890 Lymphedema, not elsewhere classified: Secondary | ICD-10-CM

## 2019-08-03 NOTE — Therapy (Signed)
[name], [name], [address] Phone: [phone]   Fax:  [phone]  Physical Therapy Treatment  Patient Details  Name: [name] MRN: [id number] Date of Birth: Oct 20, 1945 Referring Provider (PT): Celesta Gentile   Encounter Date: 08/03/2019  PT End of Session - 08/03/19 1510    Visit Number  13    Number of Visits  25    Date for PT Re-Evaluation  08/02/19    Authorization Type  [employer] medicate cert good until 0000000    Authorization - Visit Number  12    Authorization - Number of Visits  12    Progress Note Due on Visit  20    PT Start Time  1320    PT Stop Time  1445    PT Time Calculation (min)  85 min    Activity Tolerance  Patient tolerated treatment well    Behavior During Therapy  [facility] for tasks assessed/performed       Past Medical History:  Diagnosis Date  . Difficulty sleeping   . DVT (deep venous thrombosis) (HCC)    rt leg 3 yrs ago  . Hypertension   . Leg cramps   . Prostate cancer ([place])   . Swelling of lower extremity    RT LEG  . Varicose veins    both legs    Past Surgical History:  Procedure Laterality Date  . HERNIA REPAIR  2012   INGUINAL  . LYMPHADENECTOMY Bilateral 12/30/2013   Procedure: PELVIC LYMPH NODE DISSECTION;  Surgeon: Alexis Frock, MD;  [facility]: [facility];  Service: Urology;  Laterality: Bilateral;  . NASAL SINUS SURGERY    . ROBOT ASSISTED LAPAROSCOPIC RADICAL PROSTATECTOMY N/A 12/30/2013   Procedure: ROBOTIC ASSISTED LAPAROSCOPIC RADICAL PROSTATECTOMY WITH INDOCYANINE GREEN DYE;  Surgeon: Alexis Frock, MD;  [facility];  Service: Urology;  Laterality: N/A;  . VEIN SURGERY     RT LEG    There were no vitals filed for this visit.  Subjective Assessment - 08/03/19 1508    Subjective  PT request not to bandage Rt thigh states that it bothers him too much.  Pt has not made MD appointment yet, therapist encouraged pt to do so.    Pertinent History  bulge  disc in his back,  hx of DVT in RT LE, HTN, prostate cancer, CVI, lymphedema    Limitations  Walking;House hold activities    How long can you sit comfortably?  no problem    How long can you walk comfortably?  5 min    Patient Stated Goals  To get the fluid out of his legs    Pain Onset  In the past 7 days         [facility] PT Assessment - 08/03/19 0001      Assessment   Medical Diagnosis  B LE lymphedema    Referring Provider (PT)  Celesta Gentile    Onset Date/Surgical Date  05/20/09    Next MD Visit  08/01/2019    Prior Therapy  none      Precautions   Precautions  --   cellulitis      Restrictions   Weight Bearing Restrictions  No      [place] residence    Home Access  Stairs to enter    Entrance Stairs-Number of Steps  3    [place]  Two level    Alternate Level Stairs-Number of  Steps  10    Additional Comments  holds onto bannister and goes slowly       Cognition   Overall Cognitive Status  Within Functional Limits for tasks assessed      Observation/Other Assessments   Other Surveys   --   Life impact 47                  [facility] Adult PT Treatment/Exercise - 08/03/19 0001      Manual Therapy   Manual Therapy  Compression Bandaging    Manual therapy comments  completed in supine and prone    Manual Lymphatic Drainage (MLD)  short neck, deep and superficial abdominals, lumbar, inguinal/axillary anastomosis followe by  bil inguinal-axillary anastomosis B    Compression Bandaging  multiple short stretch and 1/2" foam for distal bil LE's including toe bandaging to thigh on Lt, to knee only on RT due to thigh on RT still being uncomfortable.     Other Manual Therapy  silverhydrofiber placed on wound on medial aspect of Rt LE 1x1 cm prior to bandaging                PT Short Term Goals - 07/29/19 1625      PT SHORT TERM GOAL #1   Title  Pt to be I in HEP to improve lymphatic circulation to assist in  decreasing volumes.    Time  3    Period  Weeks    Status  Achieved    Target Date  07/12/19      PT SHORT TERM GOAL #2   Title  Pt to have lost 5 cm volume from RT LE , 3 from Lt to allow pt to be able to fit in more of his shoes.    Time  3    Period  Weeks    Status  On-going        PT Long Term Goals - 07/29/19 1626      PT LONG TERM GOAL #1   Title  Pt Wound on Rt LE to be healed    Time  6    Period  Weeks    Status  On-going      PT LONG TERM GOAL #2   Title  PT to have lost 8 cm volume from RT LE, 4 from Lt to allow pt  to be able to walk for 20 minutes at a time without difficulty.    Time  6    Period  Weeks    Status  On-going      PT LONG TERM GOAL #3   Title  PT to have and be using a compression pump; order faxed to MD    Time  6    Period  Weeks    Status  On-going            Plan - 08/03/19 1515    Clinical Impression Statement  This is the patient's 12 visit and last approved by insurance.  Pt treatment was disrupted by a bout of cellulitis in his Rt LE.  PT is on oral antibiotics and the cellulitis is slowly clearing up, however this disrupted the pt's lymphedema treatments.  He was measured Friday as this was the first ime he had returned since his cellulitis, both LE were down in respect to initial eval but were up compared to the smallest volumes recorded.  PT has not maximized decongestion and will continue to benefit from skilled PT to  maximize decreased in volume load.  Pt has recieved his compression pump and therapist measured him today for thigh high compression garments as pt only has knee high.    Personal Factors and Comorbidities  Time since onset of injury/illness/exacerbation    Examination-Activity Limitations  Bathing;Carry;Dressing;Lift;Locomotion Level;Stairs    Examination-Participation Restrictions  Community Activity;Yard Work    Stability/Clinical Decision Making  Evolving/Moderate complexity    Rehab Potential  Good    PT  Frequency  3x / week    PT Duration  12 weeks    PT Treatment/Interventions  Manual lymph drainage;Patient/family education;Compression bandaging;Therapeutic exercise    PT Next Visit Plan  continue decongestive therapy for four more weeks.    PT Home Exercise Plan  LE to improve lymphatic circulation.    Consulted and Agree with Plan of Care  Patient       Patient will benefit from skilled therapeutic intervention in order to improve the following deficits and impairments:  Abnormal gait, Decreased activity tolerance, Difficulty walking, Decreased skin integrity, Increased edema  Visit Diagnosis: Lymphedema, not elsewhere classified - Plan: PT plan of care cert/re-cert     Problem List Patient Active Problem List   Diagnosis Date Noted  . Personal history of thromboembolic disease 123456  . Malnutrition of moderate degree ([place]) 01/12/2014  . Pyelonephritis 01/10/2014  . Acute pyelonephritis 01/10/2014  . Prostate cancer [facility]) 12/30/2013  [name], PT CLT [phone] 08/03/2019, 3:24 PM  [place] [address] [place], [name], [address] Phone: [phone]   Fax:  [phone]  Name: [name] MRN: [id number] Date of Birth: 1945-06-18

## 2019-08-05 ENCOUNTER — Other Ambulatory Visit: Payer: Self-pay

## 2019-08-05 ENCOUNTER — Ambulatory Visit (HOSPITAL_COMMUNITY): Payer: Medicare Other | Admitting: Physical Therapy

## 2019-08-05 DIAGNOSIS — I890 Lymphedema, not elsewhere classified: Secondary | ICD-10-CM | POA: Diagnosis not present

## 2019-08-05 DIAGNOSIS — R262 Difficulty in walking, not elsewhere classified: Secondary | ICD-10-CM

## 2019-08-05 DIAGNOSIS — S81801A Unspecified open wound, right lower leg, initial encounter: Secondary | ICD-10-CM

## 2019-08-05 NOTE — Therapy (Signed)
[place], [name], [address] Phone: [phone]   Fax:  [phone]  Physical Therapy Treatment  Patient Details  Name: [name] MRN: [id number] Date of Birth: [birth year] Referring Provider (PT): Celesta Gentile   Encounter Date: 08/05/2019  PT End of Session - 08/05/19 1307    Visit Number  14    Number of Visits  25    Date for PT Re-Evaluation  08/02/19    Authorization Type  BC[employer] medicate cert good until 0000000    Authorization - Visit Number  13   authorization put in already   Authorization - Number of Visits  12    Progress Note Due on Visit  20    PT Start Time  1050    PT Stop Time  1220    PT Time Calculation (min)  90 min    Activity Tolerance  Patient tolerated treatment well    Behavior During Therapy  [facility] for tasks assessed/performed       Past Medical History:  Diagnosis Date  . Difficulty sleeping   . DVT (deep venous thrombosis) (HCC)    rt leg 3 yrs ago  . Hypertension   . Leg cramps   . Prostate cancer ([place])   . Swelling of lower extremity    RT LEG  . Varicose veins    both legs    Past Surgical History:  Procedure Laterality Date  . HERNIA REPAIR  2012   INGUINAL  . LYMPHADENECTOMY Bilateral 12/30/2013   Procedure: PELVIC LYMPH NODE DISSECTION;  Surgeon: Alexis Frock, MD;  Location: [facility];  Service: Urology;  Laterality: Bilateral;  . NASAL SINUS SURGERY    . ROBOT ASSISTED LAPAROSCOPIC RADICAL PROSTATECTOMY N/A 12/30/2013   Procedure: ROBOTIC ASSISTED LAPAROSCOPIC RADICAL PROSTATECTOMY WITH INDOCYANINE GREEN DYE;  Surgeon: Alexis Frock, MD;  Location: [facility];  Service: Urology;  Laterality: N/A;  . VEIN SURGERY     RT LEG    There were no vitals filed for this visit.  Subjective Assessment - 08/05/19 1258    Subjective  PT states that this is his last visit states that he needs to go to other MD's about his leg as well and can not afford all of them. Therapist  explained that volumes are continuing to decrease as he should continue treatment until he is no longer reducing    Pertinent History  bulge disc in his back,  hx of DVT in RT LE, HTN, prostate cancer, CVI, lymphedema    Limitations  Walking;House hold activities    How long can you sit comfortably?  no problem    How long can you walk comfortably?  5 min    Patient Stated Goals  To get the fluid out of his legs    Currently in Pain?  No/denies            LYMPHEDEMA/ONCOLOGY QUESTIONNAIRE - 08/05/19 1054      Right Lower Extremity Lymphedema   30 cm Proximal to Suprapatella  66 cm  (Pended)     20 cm Proximal to Suprapatella  67.8 cm  (Pended)     10 cm Proximal to Suprapatella  63 cm  (Pended)     At Midpatella/Popliteal Crease  56.5 cm  (Pended)     30 cm Proximal to Floor at Lateral Plantar Foot  52.8 cm  (Pended)     20 cm Proximal to Floor at Lateral Plantar Foot  44.9 1  (  Pended)     10 cm Proximal to Floor at Lateral Malleoli  37 cm  (Pended)     Circumference of ankle/heel  43.3 cm.  (Pended)     5 cm Proximal to 1st MTP Joint  29 cm  (Pended)     Across MTP Joint  28.3 cm  (Pended)     Around Proximal Great Toe  12.7 cm  (Pended)       Left Lower Extremity Lymphedema   20 cm Proximal to Suprapatella  61 cm  (Pended)     10 cm Proximal to Suprapatella  54.4 cm  (Pended)     At Midpatella/Popliteal Crease  47 cm  (Pended)     30 cm Proximal to Floor at Lateral Plantar Foot  45.3 cm  (Pended)     20 cm Proximal to Floor at Lateral Plantar Foot  42.5 cm  (Pended)     10 cm Proximal to Floor at Lateral Malleoli  38.7 cm  (Pended)     Circumference of ankle/heel  42 cm.  (Pended)     5 cm Proximal to 1st MTP Joint  26.8 cm  (Pended)     Across MTP Joint  27.8 cm  (Pended)                 [facility] Adult PT Treatment/Exercise - 08/05/19 0001      Manual Therapy   Manual Therapy  Compression Bandaging    Manual therapy comments  completed in supine and prone     Manual Lymphatic Drainage (MLD)  short neck, deep and superficial abdominals, lumbar, inguinal/axillary anastomosis followe by  bil inguinal-axillary anastomosis B    Compression Bandaging  multiple short stretch and 1/2" foam for distal bil LE's including toe bandaging to thigh on Lt, to knee only on RT due to thigh on RT still being uncomfortable.     Other Manual Therapy  silverhydrofiber placed on wound on medial aspect of Rt LE 1x1 cm prior to bandaging                PT Short Term Goals - 07/29/19 1625      PT SHORT TERM GOAL #1   Title  Pt to be I in HEP to improve lymphatic circulation to assist in decreasing volumes.    Time  3    Period  Weeks    Status  Achieved    Target Date  07/12/19      PT SHORT TERM GOAL #2   Title  Pt to have lost 5 cm volume from RT LE , 3 from Lt to allow pt to be able to fit in more of his shoes.    Time  3    Period  Weeks    Status  On-going        PT Long Term Goals - 07/29/19 1626      PT LONG TERM GOAL #1   Title  Pt Wound on Rt LE to be healed    Time  6    Period  Weeks    Status  On-going      PT LONG TERM GOAL #2   Title  PT to have lost 8 cm volume from RT LE, 4 from Lt to allow pt  to be able to walk for 20 minutes at a time without difficulty.    Time  6    Period  Weeks    Status  On-going  PT LONG TERM GOAL #3   Title  PT to have and be using a compression pump; order faxed to MD    Time  6    Period  Weeks    Status  On-going            Plan - 08/05/19 1302    Clinical Impression Statement  PT remeasured all volumes have continued to decrease from last week.  PT will continue to benefit from decongestive therapy techniques.  Chip bag placed along posterior aspect of Rt LE along with foam to attempt to decrease decongestion.    Personal Factors and Comorbidities  Time since onset of injury/illness/exacerbation    Examination-Activity Limitations  Bathing;Carry;Dressing;Lift;Locomotion Level;Stairs     Examination-Participation Restrictions  Community Activity;Yard Work    Stability/Clinical Decision Making  Evolving/Moderate complexity    Rehab Potential  Good    PT Frequency  3x / week    PT Duration  12 weeks    PT Treatment/Interventions  Manual lymph drainage;Patient/family education;Compression bandaging;Therapeutic exercise    PT Next Visit Plan  continue decongestive therapy for four more weeks.    PT Home Exercise Plan  LE to improve lymphatic circulation.    Consulted and Agree with Plan of Care  Patient       Patient will benefit from skilled therapeutic intervention in order to improve the following deficits and impairments:  Abnormal gait, Decreased activity tolerance, Difficulty walking, Decreased skin integrity, Increased edema  Visit Diagnosis: Lymphedema, not elsewhere classified  Difficulty in walking, not elsewhere classified  Leg wound, right, initial encounter     Problem List Patient Active Problem List   Diagnosis Date Noted  . Personal history of thromboembolic disease 123456  . Malnutrition of moderate degree ([place]) 01/12/2014  . Pyelonephritis 01/10/2014  . Acute pyelonephritis 01/10/2014  . Prostate cancer [facility]) 12/30/2013    [name], PT CLT [phone] 08/05/2019, 1:08 PM  [place] [address] [place], [name], [address] Phone: [phone]   Fax:  [phone]  Name: [name] MRN: [id number] Date of Birth: 1946/02/18

## 2019-08-10 ENCOUNTER — Ambulatory Visit (HOSPITAL_COMMUNITY): Payer: Medicare Other

## 2019-08-10 ENCOUNTER — Telehealth (HOSPITAL_COMMUNITY): Payer: Self-pay

## 2019-08-10 NOTE — Telephone Encounter (Signed)
pt called to cx today's visit due to he has a cold

## 2019-08-12 ENCOUNTER — Ambulatory Visit (HOSPITAL_COMMUNITY): Payer: Medicare Other | Admitting: Physical Therapy

## 2019-08-12 ENCOUNTER — Telehealth (HOSPITAL_COMMUNITY): Payer: Self-pay | Admitting: Physical Therapy

## 2019-08-12 NOTE — Telephone Encounter (Signed)
pt called to cx today's appt due to he is not feeling well 

## 2019-08-15 ENCOUNTER — Telehealth (HOSPITAL_COMMUNITY): Payer: Self-pay

## 2019-08-15 ENCOUNTER — Telehealth (HOSPITAL_COMMUNITY): Payer: Self-pay | Admitting: Physical Therapy

## 2019-08-15 ENCOUNTER — Ambulatory Visit (HOSPITAL_COMMUNITY): Payer: Medicare Other | Admitting: Physical Therapy

## 2019-08-15 NOTE — Telephone Encounter (Signed)
called to cancel both appts due to he is still sick

## 2019-08-17 ENCOUNTER — Ambulatory Visit (HOSPITAL_COMMUNITY): Payer: Medicare Other

## 2019-08-23 ENCOUNTER — Other Ambulatory Visit: Payer: Self-pay

## 2019-08-23 ENCOUNTER — Ambulatory Visit (HOSPITAL_COMMUNITY): Payer: Medicare Other | Attending: Podiatry | Admitting: Physical Therapy

## 2019-08-23 DIAGNOSIS — S81801A Unspecified open wound, right lower leg, initial encounter: Secondary | ICD-10-CM | POA: Insufficient documentation

## 2019-08-23 DIAGNOSIS — I890 Lymphedema, not elsewhere classified: Secondary | ICD-10-CM | POA: Diagnosis not present

## 2019-08-23 DIAGNOSIS — R262 Difficulty in walking, not elsewhere classified: Secondary | ICD-10-CM | POA: Diagnosis present

## 2019-08-23 NOTE — Therapy (Addendum)
[place], [name], [address] Phone: [phone]   Fax:  [phone]  Physical Therapy Treatment  Patient Details  Name: [name] MRN: [id number] Date of Birth: January 03, 1946 Referring Provider (PT): Celesta Gentile   Encounter Date: 08/23/2019  PT End of Session - 08/23/19 1350    Visit Number  15    Number of Visits  25    Date for PT Re-Evaluation  08/02/19    Authorization Type  [employer] medicate cert good until [id number]    Authorization - Visit Number  14   authorization put in already   Authorization - Number of Visits  24    Progress Note Due on Visit  20    PT Start Time  1007    PT Stop Time  1133    PT Time Calculation (min)  86 min    Activity Tolerance  Patient tolerated treatment well    Behavior During Therapy  [facility] for tasks assessed/performed       Past Medical History:  Diagnosis Date  . Difficulty sleeping   . DVT (deep venous thrombosis) (HCC)    rt leg 3 yrs ago  . Hypertension   . Leg cramps   . Prostate cancer ([place])   . Swelling of lower extremity    RT LEG  . Varicose veins    both legs    Past Surgical History:  Procedure Laterality Date  . HERNIA REPAIR  2012   INGUINAL  . LYMPHADENECTOMY Bilateral 12/30/2013   Procedure: PELVIC LYMPH NODE DISSECTION;  Surgeon: Alexis Frock, MD;  Location: [facility];  Service: Urology;  Laterality: Bilateral;  . NASAL SINUS SURGERY    . ROBOT ASSISTED LAPAROSCOPIC RADICAL PROSTATECTOMY N/A 12/30/2013   Procedure: ROBOTIC ASSISTED LAPAROSCOPIC RADICAL PROSTATECTOMY WITH INDOCYANINE GREEN DYE;  Surgeon: Alexis Frock, MD;  [facility];  Service: Urology;  Laterality: N/A;  . VEIN SURGERY     RT LEG    There were no vitals filed for this visit.  Subjective Assessment - 08/23/19 1342    Subjective  Pt states that he has been ill with a sinus infection.  He has not been to therapy since 3/19 .  Pt has not used his pump he states that he  thought the therapist told him not to use his pump until after therapy.  Therapist reinstructed that his is to use the pump after he has taken compression bandaging off.    Pertinent History  bulge disc in his back,  hx of DVT in RT LE, HTN, prostate cancer, CVI, lymphedema    Limitations  Walking;House hold activities    How long can you sit comfortably?  no problem    How long can you walk comfortably?  5 min    Patient Stated Goals  To get the fluid out of his legs         [facility] PT Assessment - 08/23/19 0001      Assessment   Medical Diagnosis  B LE lymphedema    Referring Provider (PT)  Celesta Gentile    Onset Date/Surgical Date  05/20/09    Next MD Visit  08/01/2019    Prior Therapy  none      Precautions   Precautions  --   cellulitis      Restrictions   Weight Bearing Restrictions  No      Home Environment   Living Environment  Private residence    Home Access  Stairs to enter    [employer] of Steps  3    Home Layout  Two level    Alternate Level Stairs-Number of Steps  10    Additional Comments  holds onto bannister and goes slowly       Cognition   Overall Cognitive Status  Within Functional Limits for tasks assessed      Observation/Other Assessments   Other Surveys   --   Life impact 52       LYMPHEDEMA/ONCOLOGY QUESTIONNAIRE - 08/23/19 1008      Right Lower Extremity Lymphedema   30 cm Proximal to Suprapatella  64.5 cm   was 69.8   20 cm Proximal to Suprapatella  65 cm   was 67   10 cm Proximal to Suprapatella  62.8 cm   was 58   At Midpatella/Popliteal Crease  56 cm   was 51.8   30 cm Proximal to Floor at Lateral Plantar Foot  52.4 cm   was 45.5   20 cm Proximal to Floor at Lateral Plantar Foot  44.7 1   38.8   10 cm Proximal to Floor at Lateral Malleoli  39 cm   41.8   Circumference of ankle/heel  44.6 cm.   38.8   5 cm Proximal to 1st MTP Joint  30 cm   41.8   Across MTP Joint  27.8 cm   28.3   Around Proximal Great Toe  13  cm   was 13.7     Left Lower Extremity Lymphedema   20 cm Proximal to Suprapatella  60 cm   was 64   10 cm Proximal to Suprapatella  53.8 cm   58.5   At Midpatella/Popliteal Crease  47.5 cm   52.7   30 cm Proximal to Floor at Lateral Plantar Foot  47 cm   50.4   20 cm Proximal to Floor at Lateral Plantar Foot  45.5 cm   46.3   10 cm Proximal to Floor at Lateral Malleoli  37 cm   39   Circumference of ankle/heel  41.5 cm.   41.3   5 cm Proximal to 1st MTP Joint  26.5 cm   27.8   Across MTP Joint  27.7 cm   29.3               [facility] Adult PT Treatment/Exercise - 08/23/19 0001      Manual Therapy   Manual Therapy  Compression Bandaging    Manual therapy comments  completed in supine and prone    Manual Lymphatic Drainage (MLD)  short neck, deep and superficial abdominals, lumbar, inguinal/axillary anastomosis followe by  bil inguinal-axillary anastomosis B    Compression Bandaging  multiple short stretch and 1/2" foam for distal bil LE's including toe bandaging to thigh on Bilateral     Other Manual Therapy  silverhydrofiber placed on wound on medial aspect of Rt LE 1x1 cm prior to bandaging                PT Short Term Goals - 07/29/19 1625      PT SHORT TERM GOAL #1   Title  Pt to be I in HEP to improve lymphatic circulation to assist in decreasing volumes.    Time  3    Period  Weeks    Status  Achieved    Target Date  07/12/19      PT SHORT TERM GOAL #2   Title  Pt to have lost  5 cm volume from RT LE , 3 from Lt to allow pt to be able to fit in more of his shoes.    Time  3    Period  Weeks    Status  On-going        PT Long Term Goals - 08/23/19 1353      PT LONG TERM GOAL #1   Title  Pt Wound on Rt LE to be healed    Time  10    Period  Weeks    Status  On-going      PT LONG TERM GOAL #2   Title  PT to have lost 8 cm volume from RT LE, 4 from Lt to allow pt  to be able to walk for 20 minutes at a time without difficulty.    Time  10     Period  Weeks    Status  On-going      PT LONG TERM GOAL #3   Title  PT to have and be using a compression pump; order faxed to MD    Time  6    Period  Weeks    Status  On-going            Plan - 08/23/19 1351    Clinical Impression Statement  Pt has been ill with a sinus infection.  Pt remeasured today with noted induration and increased wound size on his RT LE, also noted increased redness overall.  Lt LE is continuing to improve.  PT will continue to benefit from total decongestive techniques to achieve maximimal decongestion.    Personal Factors and Comorbidities  Time since onset of injury/illness/exacerbation    Examination-Activity Limitations  Bathing;Carry;Dressing;Lift;Locomotion Level;Stairs    Examination-Participation Restrictions  Community Activity;Yard Work    Stability/Clinical Decision Making  Evolving/Moderate complexity    Rehab Potential  Good    PT Frequency  3x / week    PT Duration  12 weeks    PT Treatment/Interventions  Manual lymph drainage;Patient/family education;Compression bandaging;Therapeutic exercise    PT Next Visit Plan  continue decongestive therapy emphasize to pt to use pump when he takes his bandages off.    PT Home Exercise Plan  LE to improve lymphatic circulation.    Consulted and Agree with Plan of Care  Patient       Patient will benefit from skilled therapeutic intervention in order to improve the following deficits and impairments:  Abnormal gait, Decreased activity tolerance, Difficulty walking, Decreased skin integrity, Increased edema  Visit Diagnosis: Lymphedema, not elsewhere classified  Difficulty in walking, not elsewhere classified  Leg wound, right, initial encounter     Problem List Patient Active Problem List   Diagnosis Date Noted  . Personal history of thromboembolic disease 123456  . Malnutrition of moderate degree ([place]) 01/12/2014  . Pyelonephritis 01/10/2014  . Acute pyelonephritis 01/10/2014  .  Prostate cancer ([place]) 12/30/2013    Daneil Beem,CINDY 08/23/2019, 1:57 PM  [facility] [address] [place], [name], [address] Phone: [phone]   Fax:  [phone]  Name: Joaquin Linares MRN: [id number] Date of Birth: 02-28-1946

## 2019-08-25 ENCOUNTER — Other Ambulatory Visit: Payer: Self-pay

## 2019-08-25 ENCOUNTER — Ambulatory Visit (HOSPITAL_COMMUNITY): Payer: Medicare Other | Admitting: Physical Therapy

## 2019-08-25 DIAGNOSIS — S81801A Unspecified open wound, right lower leg, initial encounter: Secondary | ICD-10-CM

## 2019-08-25 DIAGNOSIS — I890 Lymphedema, not elsewhere classified: Secondary | ICD-10-CM

## 2019-08-25 DIAGNOSIS — R262 Difficulty in walking, not elsewhere classified: Secondary | ICD-10-CM

## 2019-08-25 NOTE — Therapy (Signed)
[place], [name], [address] Phone: [phone]   Fax:  [phone]  Physical Therapy Treatment  Patient Details  Name: [name] MRN: [id number] Date of Birth: 12/23/1945 Referring Provider (PT): Celesta Gentile   Encounter Date: 08/25/2019  PT End of Session - 08/25/19 1228    Visit Number  16    Number of Visits  25    Date for PT Re-Evaluation  08/02/19    Authorization Type  [employer]BS medicate cert good until [id number]    Authorization - Visit Number  15   authorization put in already   Authorization - Number of Visits  24    Progress Note Due on Visit  20    PT Start Time  1000    PT Stop Time  1130    PT Time Calculation (min)  90 min    Activity Tolerance  Patient tolerated treatment well    Behavior During Therapy  [facility] for tasks assessed/performed       Past Medical History:  Diagnosis Date  . Difficulty sleeping   . DVT (deep venous thrombosis) (HCC)    rt leg 3 yrs ago  . Hypertension   . Leg cramps   . Prostate cancer ([place])   . Swelling of lower extremity    RT LEG  . Varicose veins    both legs    Past Surgical History:  Procedure Laterality Date  . HERNIA REPAIR  2012   INGUINAL  . LYMPHADENECTOMY Bilateral 12/30/2013   Procedure: PELVIC LYMPH NODE DISSECTION;  Surgeon: Alexis Frock, MD;  [facility];  Service: Urology;  Laterality: Bilateral;  . NASAL SINUS SURGERY    . ROBOT ASSISTED LAPAROSCOPIC RADICAL PROSTATECTOMY N/A 12/30/2013   Procedure: ROBOTIC ASSISTED LAPAROSCOPIC RADICAL PROSTATECTOMY WITH INDOCYANINE GREEN DYE;  Surgeon: Alexis Frock, MD;  [facility];  Service: Urology;  Laterality: N/A;  . VEIN SURGERY     RT LEG    There were no vitals filed for this visit.  Subjective Assessment - 08/25/19 1227    Subjective  PT states that he had to take the compression off of his RT thigh due to increased pain.    Pertinent History  bulge disc in his back,  hx of  DVT in RT LE, HTN, prostate cancer, CVI, lymphedema    Limitations  Walking;House hold activities    How long can you sit comfortably?  no problem    How long can you walk comfortably?  5 min    Patient Stated Goals  To get the fluid out of his legs                       [facility] Adult PT Treatment/Exercise - 08/25/19 0001      Manual Therapy   Manual Therapy  Compression Bandaging    Manual therapy comments  completed in supine and prone    Manual Lymphatic Drainage (MLD)  short neck, deep and superficial abdominals, lumbar, inguinal/axillary anastomosis followe by  bil inguinal-axillary anastomosis B    Compression Bandaging  multiple short stretch and 1/2" foam for distal bil LE's including toe bandaging to thigh on Lt, to knee only on RT due to thigh on RT still being uncomfortable.     Other Manual Therapy  silverhydrofiber placed on wound on medial aspect of Rt LE 1x1 cm prior to bandaging  PT Short Term Goals - 07/29/19 1625      PT SHORT TERM GOAL #1   Title  Pt to be I in HEP to improve lymphatic circulation to assist in decreasing volumes.    Time  3    Period  Weeks    Status  Achieved    Target Date  07/12/19      PT SHORT TERM GOAL #2   Title  Pt to have lost 5 cm volume from RT LE , 3 from Lt to allow pt to be able to fit in more of his shoes.    Time  3    Period  Weeks    Status  On-going        PT Long Term Goals - 08/23/19 1353      PT LONG TERM GOAL #1   Title  Pt Wound on Rt LE to be healed    Time  10    Period  Weeks    Status  On-going      PT LONG TERM GOAL #2   Title  PT to have lost 8 cm volume from RT LE, 4 from Lt to allow pt  to be able to walk for 20 minutes at a time without difficulty.    Time  10    Period  Weeks    Status  On-going      PT LONG TERM GOAL #3   Title  PT to have and be using a compression pump; order faxed to MD    Time  6    Period  Weeks    Status  On-going             Plan - 08/25/19 1229    Clinical Impression Statement  PT Rt LE continues to be red, hot and indurated, Heat and redness decrease with elevation but does not fully go away.  PT does not have a fever.  Therapist urged pt to get in touch with his cardiologist to see if they feel this is cellulitis.  Pt has ordered his thigh high garment once this comes in pt will don on LT and therapist will focus on Rt LE.    Personal Factors and Comorbidities  Time since onset of injury/illness/exacerbation    Examination-Activity Limitations  Bathing;Carry;Dressing;Lift;Locomotion Level;Stairs    Examination-Participation Restrictions  Community Activity;Yard Work    Stability/Clinical Decision Making  Evolving/Moderate complexity    Rehab Potential  Good    PT Frequency  3x / week    PT Duration  12 weeks    PT Treatment/Interventions  Manual lymph drainage;Patient/family education;Compression bandaging;Therapeutic exercise    PT Next Visit Plan  continue decongestive therapy    PT Home Exercise Plan  LE to improve lymphatic circulation.    Consulted and Agree with Plan of Care  Patient       Patient will benefit from skilled therapeutic intervention in order to improve the following deficits and impairments:  Abnormal gait, Decreased activity tolerance, Difficulty walking, Decreased skin integrity, Increased edema  Visit Diagnosis: Lymphedema, not elsewhere classified  Difficulty in walking, not elsewhere classified  Leg wound, right, initial encounter     Problem List Patient Active Problem List   Diagnosis Date Noted  . Personal history of thromboembolic disease 123456  . Malnutrition of moderate degree ([place]) 01/12/2014  . Pyelonephritis 01/10/2014  . Acute pyelonephritis 01/10/2014  . Prostate cancer [facility]) 12/30/2013    [name], [place] CLT [phone] 08/25/2019, 12:33 PM  [place], [name],  [address] Phone: [phone]   Fax:  [phone]  Name: [name] MRN: [id number] Date of Birth: 08/02/1945

## 2019-08-29 ENCOUNTER — Ambulatory Visit (HOSPITAL_COMMUNITY): Payer: Medicare Other | Admitting: Physical Therapy

## 2019-08-29 ENCOUNTER — Other Ambulatory Visit: Payer: Self-pay

## 2019-08-29 DIAGNOSIS — I890 Lymphedema, not elsewhere classified: Secondary | ICD-10-CM | POA: Diagnosis not present

## 2019-08-29 DIAGNOSIS — R262 Difficulty in walking, not elsewhere classified: Secondary | ICD-10-CM

## 2019-08-29 DIAGNOSIS — S81801A Unspecified open wound, right lower leg, initial encounter: Secondary | ICD-10-CM

## 2019-08-29 NOTE — Therapy (Signed)
[name], [name], [address] Phone: [phone]   Fax:  [phone]  Physical Therapy Treatment  Patient Details  Name: [name] MRN: [id number] Date of Birth: 05-09-1946 Referring Provider (PT): Celesta Gentile   Encounter Date: 08/29/2019  PT End of Session - 08/29/19 1231    Visit Number  17    Number of Visits  25    Date for PT Re-Evaluation  08/02/19    Authorization Type  [employer] medicate cert good until [id number]    Authorization - Visit Number  16   authorization put in already   Authorization - Number of Visits  24    Progress Note Due on Visit  20    PT Start Time  1000    PT Stop Time  1134    PT Time Calculation (min)  94 min    Activity Tolerance  Patient tolerated treatment well    Behavior During Therapy  [facility] for tasks assessed/performed       Past Medical History:  Diagnosis Date  . Difficulty sleeping   . DVT (deep venous thrombosis) (HCC)    rt leg 3 yrs ago  . Hypertension   . Leg cramps   . Prostate cancer ([place])   . Swelling of lower extremity    RT LEG  . Varicose veins    both legs    Past Surgical History:  Procedure Laterality Date  . HERNIA REPAIR  2012   INGUINAL  . LYMPHADENECTOMY Bilateral 12/30/2013   Procedure: PELVIC LYMPH NODE DISSECTION;  Surgeon: Alexis Frock, MD;  Location: [facility];  Service: Urology;  Laterality: Bilateral;  . NASAL SINUS SURGERY    . ROBOT ASSISTED LAPAROSCOPIC RADICAL PROSTATECTOMY N/A 12/30/2013   Procedure: ROBOTIC ASSISTED LAPAROSCOPIC RADICAL PROSTATECTOMY WITH INDOCYANINE GREEN DYE;  Surgeon: Alexis Frock, MD;  Location: [facility];  Service: Urology;  Laterality: N/A;  . VEIN SURGERY     RT LEG    There were no vitals filed for this visit.         Subjective:  Pt states that he is not having any pain;  State that he can tell that the volume is coming down.            [facility] Adult PT Treatment/Exercise - 08/29/19 0001       Manual Therapy   Manual Therapy  Compression Bandaging    Manual therapy comments  completed in supine and prone    Manual Lymphatic Drainage (MLD)  short neck, deep and superficial abdominals, lumbar, inguinal/axillary anastomosis followe by  bil inguinal-axillary anastomosis B    Compression Bandaging  multiple short stretch and 1/2" foam for distal bil LE's including toe bandaging to thigh on Lt, to knee only on RT due to thigh on RT still being uncomfortable.     Other Manual Therapy  silverhydrofiber placed on wound on medial aspect of Rt LE 1x1 cm prior to bandaging                PT Short Term Goals - 07/29/19 1625      PT SHORT TERM GOAL #1   Title  Pt to be I in HEP to improve lymphatic circulation to assist in decreasing volumes.    Time  3    Period  Weeks    Status  Achieved    Target Date  07/12/19      PT SHORT TERM GOAL #2   Title  Pt  to have lost 5 cm volume from RT LE , 3 from Lt to allow pt to be able to fit in more of his shoes.    Time  3    Period  Weeks    Status  On-going        PT Long Term Goals - 08/23/19 1353      PT LONG TERM GOAL #1   Title  Pt Wound on Rt LE to be healed    Time  10    Period  Weeks    Status  On-going      PT LONG TERM GOAL #2   Title  PT to have lost 8 cm volume from RT LE, 4 from Lt to allow pt  to be able to walk for 20 minutes at a time without difficulty.    Time  10    Period  Weeks    Status  On-going      PT LONG TERM GOAL #3   Title  PT to have and be using a compression pump; order faxed to MD    Time  6    Period  Weeks    Status  On-going            Plan - 08/29/19 1231    Clinical Impression Statement  Rt LE much improved today with significant decreased redness and no heat.  Manual 80% on RT vs LT  due to significant increased induration on RT vs. Lt.  Some, but minimal changes noted with induration on Rt LE    Personal Factors and Comorbidities  Time since onset of  injury/illness/exacerbation    Examination-Activity Limitations  Bathing;Carry;Dressing;Lift;Locomotion Level;Stairs    Examination-Participation Restrictions  Community Activity;Yard Work    Stability/Clinical Decision Making  Evolving/Moderate complexity    Rehab Potential  Good    PT Frequency  3x / week    PT Duration  12 weeks    PT Treatment/Interventions  Manual lymph drainage;Patient/family education;Compression bandaging;Therapeutic exercise    PT Next Visit Plan Measure volume.   continue decongestive therapy; monitor Rt LE for signs of cellulitis.  Continue to spend the majority of the time on pt Rt LE>    PT Home Exercise Plan  LE to improve lymphatic circulation.    Consulted and Agree with Plan of Care  Patient       Patient will benefit from skilled therapeutic intervention in order to improve the following deficits and impairments:  Abnormal gait, Decreased activity tolerance, Difficulty walking, Decreased skin integrity, Increased edema  Visit Diagnosis: Lymphedema, not elsewhere classified  Difficulty in walking, not elsewhere classified  Leg wound, right, initial encounter     Problem List Patient Active Problem List   Diagnosis Date Noted  . Personal history of thromboembolic disease 123456  . Malnutrition of moderate degree ([place]) 01/12/2014  . Pyelonephritis 01/10/2014  . Acute pyelonephritis 01/10/2014  . Prostate cancer [facility]) 12/30/2013   [name], PT CLT [phone] 08/29/2019, 12:34 PM  [facility] [address] [place], [name], [address] Phone: [phone]   Fax:  [phone]  Name: [name] MRN: [id number] Date of Birth: 1946/03/18

## 2019-08-31 ENCOUNTER — Other Ambulatory Visit: Payer: Self-pay

## 2019-08-31 ENCOUNTER — Ambulatory Visit (HOSPITAL_COMMUNITY): Payer: Medicare Other | Admitting: Physical Therapy

## 2019-08-31 DIAGNOSIS — I890 Lymphedema, not elsewhere classified: Secondary | ICD-10-CM | POA: Diagnosis not present

## 2019-08-31 DIAGNOSIS — S81801A Unspecified open wound, right lower leg, initial encounter: Secondary | ICD-10-CM

## 2019-08-31 NOTE — Therapy (Signed)
[name], [name], [address] Phone: [phone]   Fax:  [phone]  Physical Therapy Treatment  Patient Details  Name: [name] MRN: [id number] Date of Birth: 1945-11-19 Referring Provider (PT): Celesta Gentile   Encounter Date: 08/31/2019  PT End of Session - 08/31/19 1228    Visit Number  18    Number of Visits  25    Date for PT Re-Evaluation  08/02/19    Authorization Type  [employer] medicate cert good until [id number]    Authorization - Visit Number  18   authorization put in already   Authorization - Number of Visits  24    Progress Note Due on Visit  20    PT Start Time  1040    PT Stop Time  1220    PT Time Calculation (min)  100 min    Activity Tolerance  Patient tolerated treatment well    Behavior During Therapy  [facility] for tasks assessed/performed       Past Medical History:  Diagnosis Date  . Difficulty sleeping   . DVT (deep venous thrombosis) (HCC)    rt leg 3 yrs ago  . Hypertension   . Leg cramps   . Prostate cancer ([name])   . Swelling of lower extremity    RT LEG  . Varicose veins    both legs    Past Surgical History:  Procedure Laterality Date  . HERNIA REPAIR  2012   INGUINAL  . LYMPHADENECTOMY Bilateral 12/30/2013   Procedure: PELVIC LYMPH NODE DISSECTION;  Surgeon: Alexis Frock, MD;  Location: [facility];  Service: Urology;  Laterality: Bilateral;  . NASAL SINUS SURGERY    . ROBOT ASSISTED LAPAROSCOPIC RADICAL PROSTATECTOMY N/A 12/30/2013   Procedure: ROBOTIC ASSISTED LAPAROSCOPIC RADICAL PROSTATECTOMY WITH INDOCYANINE GREEN DYE;  Surgeon: Alexis Frock, MD;  Location: [facility];  Service: Urology;  Laterality: N/A;  . VEIN SURGERY     RT LEG    There were no vitals filed for this visit.  Subjective Assessment - 08/31/19 1227    Subjective  Pt still has not used his pump.  PT states that he really needs to use the bathroom after treatments.    Pertinent History  bulge disc in his  back,  hx of DVT in RT LE, HTN, prostate cancer, CVI, lymphedema    Limitations  Walking;House hold activities    How long can you sit comfortably?  no problem    How long can you walk comfortably?  5 min    Patient Stated Goals  To get the fluid out of his legs    Currently in Pain?  No/denies            LYMPHEDEMA/ONCOLOGY QUESTIONNAIRE - 08/31/19 1047      Right Lower Extremity Lymphedema   30 cm Proximal to Suprapatella  65.5 cm  (Pended)     20 cm Proximal to Suprapatella  65.5 cm  (Pended)     10 cm Proximal to Suprapatella  64.8 cm  (Pended)     At Midpatella/Popliteal Crease  56.5 cm  (Pended)     30 cm Proximal to Floor at Lateral Plantar Foot  51.9 cm  (Pended)     20 cm Proximal to Floor at Lateral Plantar Foot  44.7 1  (Pended)     10 cm Proximal to Floor at Lateral Malleoli  37.5 cm  (Pended)     Circumference of ankle/heel  43.5 cm.  (  Pended)     5 cm Proximal to 1st MTP Joint  30.7 cm  (Pended)     Across MTP Joint  27 cm  (Pended)     Around Proximal Great Toe  12.7 cm  (Pended)       Left Lower Extremity Lymphedema   20 cm Proximal to Suprapatella  60 cm  (Pended)     10 cm Proximal to Suprapatella  54 cm  (Pended)     At Midpatella/Popliteal Crease  48 cm  (Pended)     30 cm Proximal to Floor at Lateral Plantar Foot  48 cm  (Pended)     20 cm Proximal to Floor at Lateral Plantar Foot  45 cm  (Pended)     10 cm Proximal to Floor at Lateral Malleoli  37.4 cm  (Pended)     Circumference of ankle/heel  40.7 cm.  (Pended)     5 cm Proximal to 1st MTP Joint  26.5 cm  (Pended)     Across MTP Joint  27.2 cm  (Pended)     Around Proximal Great Toe  10.2 cm  (Pended)                 [facility] Adult PT Treatment/Exercise - 08/31/19 0001      Manual Therapy   Manual Therapy  Compression Bandaging    Manual therapy comments  completed in supine and prone    Manual Lymphatic Drainage (MLD)  short neck, deep and superficial abdominals, lumbar, inguinal/axillary  anastomosis followe by  bil inguinal-axillary anastomosis B    Compression Bandaging  multiple short stretch and 1/2" foam for distal bil LE's including toe bandaging to thigh on Lt, to knee only on RT due to thigh on RT still being uncomfortable.     Other Manual Therapy  silverhydrofiber placed on wound on medial aspect of Rt LE 1x1 cm prior to bandaging                PT Short Term Goals - 07/29/19 1625      PT SHORT TERM GOAL #1   Title  Pt to be I in HEP to improve lymphatic circulation to assist in decreasing volumes.    Time  3    Period  Weeks    Status  Achieved    Target Date  07/12/19      PT SHORT TERM GOAL #2   Title  Pt to have lost 5 cm volume from RT LE , 3 from Lt to allow pt to be able to fit in more of his shoes.    Time  3    Period  Weeks    Status  On-going        PT Long Term Goals - 08/23/19 1353      PT LONG TERM GOAL #1   Title  Pt Wound on Rt LE to be healed    Time  10    Period  Weeks    Status  On-going      PT LONG TERM GOAL #2   Title  PT to have lost 8 cm volume from RT LE, 4 from Lt to allow pt  to be able to walk for 20 minutes at a time without difficulty.    Time  10    Period  Weeks    Status  On-going      PT LONG TERM GOAL #3   Title  PT to have and be using  a compression pump; order faxed to MD    Time  6    Period  Weeks    Status  On-going            Plan - 08/31/19 1229    Clinical Impression Statement  Pt measured today with Lt LE staying stable; Rt LE slightly up in thigh but lower in LE.  Therapist stressed to pt that he needs to use the compression pump when he is rolling the bandages.  Pt states he doesn't have the time, therapist pointed out that if he uses the pump while rolling he is not losing any time.    Personal Factors and Comorbidities  Time since onset of injury/illness/exacerbation    Examination-Activity Limitations  Bathing;Carry;Dressing;Lift;Locomotion Level;Stairs     Examination-Participation Restrictions  Community Activity;Yard Work    Stability/Clinical Decision Making  Evolving/Moderate complexity    Rehab Potential  Good    PT Frequency  3x / week    PT Duration  12 weeks    PT Treatment/Interventions  Manual lymph drainage;Patient/family education;Compression bandaging;Therapeutic exercise    PT Next Visit Plan  continue decongestive therapy; monitor Rt LE for signs of cellulitis.  Continue to spend the majority of the time on pt Rt LE>    PT Home Exercise Plan  LE to improve lymphatic circulation.    Consulted and Agree with Plan of Care  Patient       Patient will benefit from skilled therapeutic intervention in order to improve the following deficits and impairments:  Abnormal gait, Decreased activity tolerance, Difficulty walking, Decreased skin integrity, Increased edema  Visit Diagnosis: Lymphedema, not elsewhere classified  Leg wound, right, initial encounter     Problem List Patient Active Problem List   Diagnosis Date Noted  . Personal history of thromboembolic disease 123456  . Malnutrition of moderate degree ([place]) 01/12/2014  . Pyelonephritis 01/10/2014  . Acute pyelonephritis 01/10/2014  . Prostate cancer [facility]) 12/30/2013    [name], PT CLT [phone] 08/31/2019, 12:31 PM  [facility] [address] [place], [name], [address] Phone: [phone]   Fax:  [phone]  Name: Anirudh Givins MRN: [id number] Date of Birth: 1946/05/05

## 2019-09-02 ENCOUNTER — Telehealth (HOSPITAL_COMMUNITY): Payer: Self-pay | Admitting: Physical Therapy

## 2019-09-02 ENCOUNTER — Ambulatory Visit (HOSPITAL_COMMUNITY): Payer: Medicare Other | Admitting: Physical Therapy

## 2019-09-02 NOTE — Telephone Encounter (Signed)
Pt called he will not be here today -his back has locked up on him

## 2019-09-05 ENCOUNTER — Telehealth (HOSPITAL_COMMUNITY): Payer: Self-pay | Admitting: Physical Therapy

## 2019-09-05 ENCOUNTER — Ambulatory Visit (HOSPITAL_COMMUNITY): Payer: Medicare Other | Admitting: Physical Therapy

## 2019-09-05 NOTE — Telephone Encounter (Signed)
Pt cancelled appt for today because he is having problems with his back

## 2019-09-07 ENCOUNTER — Ambulatory Visit (HOSPITAL_COMMUNITY): Payer: Medicare Other | Admitting: Physical Therapy

## 2019-09-07 ENCOUNTER — Telehealth (HOSPITAL_COMMUNITY): Payer: Self-pay | Admitting: Physical Therapy

## 2019-09-07 NOTE — Telephone Encounter (Signed)
pt called to cx today's appt due to he is having back problems.

## 2019-09-08 ENCOUNTER — Telehealth (HOSPITAL_COMMUNITY): Payer: Self-pay

## 2019-09-08 NOTE — Telephone Encounter (Signed)
Pt called stated he has high back pain and wishes to change lymphedema treatments to work on his back.  Pt educated need to call MD and see if he will get order to be treated for LBP.  Pt educated with lymphedema treatment POC and need for compression garment for edema control.    Ihor Austin, LPTA/CLT; Delana Meyer [phone]

## 2019-09-08 NOTE — Telephone Encounter (Signed)
Received message earlier today to call pt. back.  Called with no answer, left message with contact information.  Ihor Austin, LPTA/CLT; Delana Meyer [phone]

## 2019-09-08 NOTE — Telephone Encounter (Signed)
pt called to cancel the appts due to his back.

## 2019-09-09 ENCOUNTER — Ambulatory Visit (HOSPITAL_COMMUNITY): Payer: Medicare Other

## 2019-09-12 ENCOUNTER — Ambulatory Visit (HOSPITAL_COMMUNITY): Payer: Medicare Other | Admitting: Physical Therapy

## 2019-09-12 ENCOUNTER — Telehealth (HOSPITAL_COMMUNITY): Payer: Self-pay | Admitting: Physical Therapy

## 2019-09-12 NOTE — Telephone Encounter (Signed)
PT did not show for appt.  Spoke with pateint on the phone who states his back is just hurting too bad for him to continue his lymphedema therapy.  States he is using his pump daily and wearing his garment.  Reiterated the importance of the garments wearing these daily and getting new garments every 6 months.  Pt states he would like to be discharged at this time.  Teena Irani, PTA/CLT [phone]

## 2019-09-12 NOTE — Telephone Encounter (Signed)
Amy F said to cx all future apptments patient will be d/c today

## 2019-09-14 ENCOUNTER — Ambulatory Visit (HOSPITAL_COMMUNITY): Payer: Medicare Other

## 2019-09-16 ENCOUNTER — Ambulatory Visit (HOSPITAL_COMMUNITY): Payer: Medicare Other | Admitting: Physical Therapy

## 2019-09-19 ENCOUNTER — Ambulatory Visit (HOSPITAL_COMMUNITY): Payer: Medicare Other | Admitting: Physical Therapy

## 2019-09-20 ENCOUNTER — Ambulatory Visit (HOSPITAL_COMMUNITY): Payer: Medicare Other | Attending: Cardiovascular Disease | Admitting: Physical Therapy

## 2019-09-20 ENCOUNTER — Other Ambulatory Visit: Payer: Self-pay

## 2019-09-20 ENCOUNTER — Encounter (HOSPITAL_COMMUNITY): Payer: Self-pay | Admitting: Physical Therapy

## 2019-09-20 DIAGNOSIS — M545 Low back pain, unspecified: Secondary | ICD-10-CM

## 2019-09-20 DIAGNOSIS — R2689 Other abnormalities of gait and mobility: Secondary | ICD-10-CM | POA: Insufficient documentation

## 2019-09-20 DIAGNOSIS — M6281 Muscle weakness (generalized): Secondary | ICD-10-CM | POA: Diagnosis present

## 2019-09-20 NOTE — Patient Instructions (Signed)
Access Code: [email or web address] URL: [email or web address] Date: 09/20/2019 Prepared by: Josue Hector  Exercises Prone on Elbows Stretch - 3 x daily - 7 x weekly - 1 sets - 1 reps - 2-3 minutes hold

## 2019-09-20 NOTE — Therapy (Signed)
[place], [name], [address] Phone: [phone]   Fax:  [phone]  Physical Therapy Evaluation  Patient Details  Name: [name] MRN: [id number] Date of Birth: 01-14-1946 Referring Provider (PT): Dixie Dials MD   Encounter Date: 09/20/2019  PT End of Session - 09/20/19 1341    Visit Number  1    Number of Visits  16    Date for PT Re-Evaluation  11/18/19    Authorization Type  BCBS Medicare No VL    Progress Note Due on Visit  10    PT Start Time  1300    PT Stop Time  1400    PT Time Calculation (min)  60 min    Activity Tolerance  Patient tolerated treatment well    Behavior During Therapy  [facility] for tasks assessed/performed       Past Medical History:  Diagnosis Date  . Difficulty sleeping   . DVT (deep venous thrombosis) (HCC)    rt leg 3 yrs ago  . Hypertension   . Leg cramps   . Prostate cancer ([place])   . Swelling of lower extremity    RT LEG  . Varicose veins    both legs    Past Surgical History:  Procedure Laterality Date  . HERNIA REPAIR  2012   INGUINAL  . LYMPHADENECTOMY Bilateral 12/30/2013   Procedure: PELVIC LYMPH NODE DISSECTION;  Surgeon: Alexis Frock, MD;  Location: [facility];  Service: Urology;  Laterality: Bilateral;  . NASAL SINUS SURGERY    . ROBOT ASSISTED LAPAROSCOPIC RADICAL PROSTATECTOMY N/A 12/30/2013   Procedure: ROBOTIC ASSISTED LAPAROSCOPIC RADICAL PROSTATECTOMY WITH INDOCYANINE GREEN DYE;  Surgeon: Alexis Frock, MD;  Location: [facility];  Service: Urology;  Laterality: N/A;  . VEIN SURGERY     RT LEG    There were no vitals filed for this visit.   Subjective Assessment - 09/20/19 1320    Subjective  Patient presents to physical therapy with complaint of low back pain. Patient was previously being seen in this clinic for lymphedema a in his legs. Says his legs have been doing much better, but a few weeks ago he had to pick up his daughter who went into a  diabetic shock. Says after that he has had trouble standing up straight. Says he went to ortho MD and had MRI which showed disc bulge. Says this has made it very difficult for him to walk and perform ADLs. Also notes this has made things sore in his saddle region. Says he has been prescribed muscle relaxers which don't help much, denies talking pain medication.    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  no problem    How long can you stand comfortably?  no problem    How long can you walk comfortably?  5 min    Patient Stated Goals  work on my back    Currently in Pain?  No/denies   No pain unless standing/ walking; 8/10 discomfort at worst "spasm"   Pain Type  Acute pain    Pain Onset  1 to 4 weeks ago    Pain Frequency  Intermittent    Aggravating Factors   standing, walking    Pain Relieving Factors  sitting  [facility] PT Assessment - 09/20/19 0001      Assessment   Medical Diagnosis  Low back pain     Referring Provider (PT)  Dixie Dials MD    Onset Date/Surgical Date  03/02/20    Next MD Visit  10/06/19    Prior Therapy  yes      Precautions   Precautions  --   RLE lymphedema     Restrictions   Weight Bearing Restrictions  No      Balance Screen   Has the patient fallen in the past 6 months  No    Has the patient had a decrease in activity level because of a fear of falling?   No    Is the patient reluctant to leave their home because of a fear of falling?   No      Home Film/video editor residence    Living Arrangements  Spouse/significant other      Prior Function   Level of Independence  Independent      Cognition   Overall Cognitive Status  Within Functional Limits for tasks assessed      Observation/Other Assessments   Observations  Noted RLE lymphedema    Focus on  Therapeutic Outcomes (FOTO)   54% limited       Sensation   Light Touch  Appears Intact      Posture/Postural Control   Posture/Postural Control  Postural limitations    Postural Limitations  Rounded Shoulders;Forward head;Flexed trunk      ROM / Strength   AROM / PROM / Strength  AROM;Strength      AROM   AROM Assessment Site  Lumbar    Lumbar Flexion  50% limitation     Lumbar Extension  50% limitation     Lumbar - Right Side Bend  25% limitation     Lumbar - Left Side Bend  25% limitation    Lumbar - Right Rotation  25% limitation     Lumbar - Left Rotation  25% limitation       Strength   Strength Assessment Site  Hip;Knee;Ankle    Right/Left Hip  Right;Left    Right Hip Flexion  5/5    Right Hip Extension  4-/5    Right Hip ABduction  3-/5    Left Hip Flexion  5/5    Left Hip Extension  4/5    Left Hip ABduction  3+/5    Right/Left Knee  Right;Left    Right Knee Flexion  4+/5    Right Knee Extension  4+/5    Left Knee Flexion  5/5    Left Knee Extension  5/5    Right/Left Ankle  Right;Left    Right Ankle Dorsiflexion  5/5    Left Ankle Dorsiflexion  5/5      Palpation   Palpation comment  No noted tenderness to palpation about lumbar paraspinals, posterior hip musculature      Transfers   Five time sit to stand comments   18.5 sec with no UEs      Ambulation/Gait   Ambulation/Gait  Yes    Ambulation/Gait Assistance  6: Modified independent (Device/Increase time)    Ambulation Distance (Feet)  280 Feet    Assistive device  None    Gait Pattern  Decreased step length - right;Decreased step length - left;Decreased stance time - right;Decreased stance time - left;Decreased stride length;Trendelenburg    Ambulation Surface  Level;Indoor  Gait Comments  2MWT      Balance   Balance Assessed  Yes      Static Standing Balance   Static Standing Balance -  Activities   Tandam Stance - Right Leg;Tandam Stance - Left Leg    Static Standing - Comment/# of Minutes   25 sec; 25 sec                Objective measurements completed on examination: See above findings.      [facility] Adult PT Treatment/Exercise - 09/20/19 0001      Exercises   Exercises  Lumbar      Lumbar Exercises: Stretches   Prone on Elbows Stretch  1 rep   2 min            PT Education - 09/20/19 1321    Education Details  On evaluation findings, POC and HEP    Person(s) Educated  Patient    Methods  Explanation    Comprehension  Verbalized understanding       PT Short Term Goals - 09/20/19 1422      PT SHORT TERM GOAL #1   Title  Patient will be independent with initial HEP and self-management strategies to improve functional outcomes    Time  4    Period  Weeks    Status  New    Target Date  10/21/19      PT SHORT TERM GOAL #2   Title  Patient will improve FOTO score by 5% to indicate improvement in functional outcomes    Time  4    Period  Weeks    Status  New    Target Date  10/21/19        PT Long Term Goals - 09/20/19 1422      PT LONG TERM GOAL #1   Title  Patient will improve FOTO score by10% to indicate improvement in functional outcomes    Time  8    Period  Weeks    Status  New    Target Date  11/18/19      PT LONG TERM GOAL #2   Title  Patient will report at least 75% overall improvement in subjective complaint to indicate improvement in ability to perform ADLs.    Time  8    Period  Weeks    Status  New    Target Date  11/18/19      PT LONG TERM GOAL #3   Title  Patient will be able to ambulate at least 350 feet during 2MWT with LRAD to demonstrate improved ability to perform functional mobility and associated tasks.    Time  8    Period  Weeks    Status  New    Target Date  11/18/19      PT LONG TERM GOAL #4   Title  Patient will have equal to or > 4+/5 MMT throughout BLE to improve ability to perform functional mobility, stair ambulation and ADLs.    Time  8    Period  Weeks    Status  New    Target Date  11/18/19       PT LONG TERM GOAL #5   Title  Patient will be able to perform stand x 5 in < 15 seconds to demonstrate improvement in functional mobility and reduced risk for falls.    Time  8    Period  Weeks    Status  New    Target  Date  11/18/19             Plan - 09/20/19 1344    Clinical Impression Statement  Patient is a 74 y.o. male who presents to physical therapy with complaint of low back pain. Patient demonstrates decreased strength, ROM restriction, balance deficits and gait abnormalities which are likely contributing to symptoms of pain and are negatively impacting patient ability to perform ADLs and functional mobility tasks. Patient will benefit from skilled physical therapy services to address these deficits to reduce pain, improve level of function with ADLs, functional mobility tasks, and reduce risk for falls.    Personal Factors and Comorbidities  Age;Comorbidity 2    Comorbidities  lymphedema, high blood pressure    Examination-Activity Limitations  Bathing;Carry;Dressing;Lift;Locomotion Level;Stairs;Squat;Stand;Transfers;Caring for Others;Bend    Examination-Participation Restrictions  Community Activity;Yard Work    Stability/Clinical Decision Making  Stable/Uncomplicated    Clinical Decision Making  Low    Rehab Potential  Good    PT Frequency  2x / week    PT Duration  8 weeks    PT Treatment/Interventions  Manual lymph drainage;Patient/family education;Compression bandaging;Therapeutic exercise;ADLs/Self Care Home Management;Aquatic Therapy;Biofeedback;Cryotherapy;Electrical Stimulation;Iontophoresis 4mg /ml Dexamethasone;Moist Heat;Traction;Balance training;Manual techniques;Vestibular;Vasopneumatic Device;Taping;Splinting;Joint Manipulations;Spinal Manipulations;Passive range of motion;Scar mobilization;Neuromuscular re-education;Ultrasound;Cognitive remediation;Parrafin;Fluidtherapy;Contrast Bath;DME Instruction;Gait training;Dry needling;Stair training;Energy  conservation;Prosthetic Training;Functional mobility training;Wheelchair mobility training;Therapeutic activities;Orthotic Fit/Training    PT Next Visit Plan  Review goals, and HEP. Progress core and hip strengthing as tolerated. Start with ab set, ab march, bridging. Progress to standing LE strengthing anf postural stabilization as able.    PT Home Exercise Plan  09/20/19: POE    Consulted and Agree with Plan of Care  Patient       Patient will benefit from skilled therapeutic intervention in order to improve the following deficits and impairments:  Abnormal gait, Decreased activity tolerance, Difficulty walking, Decreased skin integrity, Increased edema, Hypomobility, Decreased endurance, Decreased strength, Pain, Decreased balance, Decreased mobility, Decreased range of motion, Improper body mechanics, Postural dysfunction, Impaired flexibility  Visit Diagnosis: Low back pain without sciatica, unspecified back pain laterality, unspecified chronicity  Muscle weakness (generalized)  Other abnormalities of gait and mobility     Problem List Patient Active Problem List   Diagnosis Date Noted  . Personal history of thromboembolic disease 123456  . Malnutrition of moderate degree ([place]) 01/12/2014  . Pyelonephritis 01/10/2014  . Acute pyelonephritis 01/10/2014  . Prostate cancer ([place]) 12/30/2013    2:28 PM, 09/20/19 Josue Hector PT DPT  Physical Therapist with [facility]  ([phone] [place], [name], [address] Phone: [phone]   Fax:  [phone]  Name: [name] MRN: [id number] Date of Birth: 1946/04/29

## 2019-09-21 ENCOUNTER — Encounter (HOSPITAL_COMMUNITY): Payer: Medicare Other | Admitting: Physical Therapy

## 2019-09-22 ENCOUNTER — Encounter (HOSPITAL_COMMUNITY): Payer: Medicare Other | Admitting: Physical Therapy

## 2019-09-22 ENCOUNTER — Telehealth (HOSPITAL_COMMUNITY): Payer: Self-pay | Admitting: Physical Therapy

## 2019-09-22 NOTE — Telephone Encounter (Signed)
PTA called out and will not be in the office - offered Friday-patient wants to wait to be seen next on Tuesday. Friday did not work for him

## 2019-09-23 ENCOUNTER — Encounter (HOSPITAL_COMMUNITY): Payer: Medicare Other | Admitting: Physical Therapy

## 2019-09-27 ENCOUNTER — Other Ambulatory Visit: Payer: Self-pay

## 2019-09-27 ENCOUNTER — Ambulatory Visit (HOSPITAL_COMMUNITY): Payer: Medicare Other | Admitting: Physical Therapy

## 2019-09-27 DIAGNOSIS — M545 Low back pain, unspecified: Secondary | ICD-10-CM

## 2019-09-27 DIAGNOSIS — M6281 Muscle weakness (generalized): Secondary | ICD-10-CM

## 2019-09-27 DIAGNOSIS — R2689 Other abnormalities of gait and mobility: Secondary | ICD-10-CM

## 2019-09-27 NOTE — Therapy (Signed)
[place], [name], [address] Phone: [phone]   Fax:  [phone]  Physical Therapy Treatment  Patient Details  Name: [name] MRN: [id number] Date of Birth: 03/03/1946 Referring Provider (PT): Dixie Dials MD   Encounter Date: 09/27/2019  PT End of Session - 09/27/19 1047    Visit Number  2    Number of Visits  16    Date for PT Re-Evaluation  11/18/19    Authorization Type  BCBS Medicare No VL    Progress Note Due on Visit  10    PT Start Time  1007    PT Stop Time  1047    PT Time Calculation (min)  40 min    Activity Tolerance  Patient tolerated treatment well    Behavior During Therapy  [facility] for tasks assessed/performed       Past Medical History:  Diagnosis Date  . Difficulty sleeping   . DVT (deep venous thrombosis) (HCC)    rt leg 3 yrs ago  . Hypertension   . Leg cramps   . Prostate cancer ([place])   . Swelling of lower extremity    RT LEG  . Varicose veins    both legs    Past Surgical History:  Procedure Laterality Date  . HERNIA REPAIR  2012   INGUINAL  . LYMPHADENECTOMY Bilateral 12/30/2013   Procedure: PELVIC LYMPH NODE DISSECTION;  Surgeon: Alexis Frock, MD;  Location: [facility];  Service: Urology;  Laterality: Bilateral;  . NASAL SINUS SURGERY    . ROBOT ASSISTED LAPAROSCOPIC RADICAL PROSTATECTOMY N/A 12/30/2013   Procedure: ROBOTIC ASSISTED LAPAROSCOPIC RADICAL PROSTATECTOMY WITH INDOCYANINE GREEN DYE;  Surgeon: Alexis Frock, MD;  Location: [facility];  Service: Urology;  Laterality: N/A;  . VEIN SURGERY     RT LEG    There were no vitals filed for this visit.  Subjective Assessment - 09/27/19 1014    Subjective  pt states he tends to think his pain is from a hernia.  States he has no back pain today but it is all in his lower abdomen/groin.  STates he is using his pump and wearing his compression stockings.    Currently in Pain?  Yes    Pain Score  5     Pain Location   Groin    Pain Orientation  Right;Left    Pain Descriptors / Indicators  Aching                       [facility] Adult PT Treatment/Exercise - 09/27/19 0001      Lumbar Exercises: Stretches   Lower Trunk Rotation  10 seconds;5 reps    Standing Extension  10 reps    Prone on Elbows Stretch  60 seconds    Other Lumbar Stretch Exercise  prone lying 2 minutes      Lumbar Exercises: Supine   Clam  10 reps    Clam Limitations  cues for stability    Bridge  10 reps      Lumbar Exercises: Prone   Other Prone Lumbar Exercises  heelsqueezes 10X5"              PT Education - 09/27/19 1045    Education Details  importance of correct compression hose; pumping alone will not keep lymphedema contained.  Urged to purchase at least 20-29mmHG and thigh highs (wearing 15 to knee)    Person(s) Educated  Patient    Methods  Explanation    Comprehension  Verbalized understanding;Need further instruction       PT Short Term Goals - 09/20/19 1422      PT SHORT TERM GOAL #1   Title  Patient will be independent with initial HEP and self-management strategies to improve functional outcomes    Time  4    Period  Weeks    Status  New    Target Date  10/21/19      PT SHORT TERM GOAL #2   Title  Patient will improve FOTO score by 5% to indicate improvement in functional outcomes    Time  4    Period  Weeks    Status  New    Target Date  10/21/19        PT Long Term Goals - 09/20/19 1422      PT LONG TERM GOAL #1   Title  Patient will improve FOTO score by10% to indicate improvement in functional outcomes    Time  8    Period  Weeks    Status  New    Target Date  11/18/19      PT LONG TERM GOAL #2   Title  Patient will report at least 75% overall improvement in subjective complaint to indicate improvement in ability to perform ADLs.    Time  8    Period  Weeks    Status  New    Target Date  11/18/19      PT LONG TERM GOAL #3   Title  Patient will be able to ambulate  at least 350 feet during 2MWT with LRAD to demonstrate improved ability to perform functional mobility and associated tasks.    Time  8    Period  Weeks    Status  New    Target Date  11/18/19      PT LONG TERM GOAL #4   Title  Patient will have equal to or > 4+/5 MMT throughout BLE to improve ability to perform functional mobility, stair ambulation and ADLs.    Time  8    Period  Weeks    Status  New    Target Date  11/18/19      PT LONG TERM GOAL #5   Title  Patient will be able to perform stand x 5 in < 15 seconds to demonstrate improvement in functional mobility and reduced risk for falls.    Time  8    Period  Weeks    Status  New    Target Date  11/18/19            Plan - 09/27/19 1047    Clinical Impression Statement  Began session with prone lying, POE and heelsqueezes then progressed to supine stabilization and lumbar extension.  Urged pt to return to MD if feels he has a hernia and also educated regarding compression stockings.  Pt wearing 24mmHG knee highs and LE's heavily indurated/swollen.  Encougaged higher compression at least 20-30 as pumping alone will not keep contained.  pt verbalized understanding, however will need reinforcing of this.  Pt with extreme core weakness and forward bent posturing contributing to ongoing dysfunction.  Improvement noted with standing and prone extension.    Personal Factors and Comorbidities  Age;Comorbidity 2    Comorbidities  lymphedema, high blood pressure    Examination-Activity Limitations  Bathing;Carry;Dressing;Lift;Locomotion Level;Stairs;Squat;Stand;Transfers;Caring for Others;Bend    Examination-Participation Restrictions  Community Activity;Yard Work    Stability/Clinical Decision Making  Stable/Uncomplicated  Rehab Potential  Good    PT Frequency  2x / week    PT Duration  8 weeks    PT Treatment/Interventions  Manual lymph drainage;Patient/family education;Compression bandaging;Therapeutic exercise;ADLs/Self Care  Home Management;Aquatic Therapy;Biofeedback;Cryotherapy;Electrical Stimulation;Iontophoresis 4mg /ml Dexamethasone;Moist Heat;Traction;Balance training;Manual techniques;Vestibular;Vasopneumatic Device;Taping;Splinting;Joint Manipulations;Spinal Manipulations;Passive range of motion;Scar mobilization;Neuromuscular re-education;Ultrasound;Cognitive remediation;Parrafin;Fluidtherapy;Contrast Bath;DME Instruction;Gait training;Dry needling;Stair training;Energy conservation;Prosthetic Training;Functional mobility training;Wheelchair mobility training;Therapeutic activities;Orthotic Fit/Training    PT Next Visit Plan  Continue to progress core and hip strengthing as tolerated. Next session add ab set and  ab march. Progress to standing LE strengthing anf postural stabilization as able.    PT Home Exercise Plan  09/20/19: POE    Consulted and Agree with Plan of Care  Patient       Patient will benefit from skilled therapeutic intervention in order to improve the following deficits and impairments:  Abnormal gait, Decreased activity tolerance, Difficulty walking, Decreased skin integrity, Increased edema, Hypomobility, Decreased endurance, Decreased strength, Pain, Decreased balance, Decreased mobility, Decreased range of motion, Improper body mechanics, Postural dysfunction, Impaired flexibility  Visit Diagnosis: Muscle weakness (generalized)  Low back pain without sciatica, unspecified back pain laterality, unspecified chronicity  Other abnormalities of gait and mobility     Problem List Patient Active Problem List   Diagnosis Date Noted  . Personal history of thromboembolic disease 123456  . Malnutrition of moderate degree ([place]) 01/12/2014  . Pyelonephritis 01/10/2014  . Acute pyelonephritis 01/10/2014  . Prostate cancer ([place]) 12/30/2013   Teena Irani, PTA/CLT [phone]  Teena Irani 09/27/2019, 11:33 AM  [place], [name], [address] Phone: [phone]   Fax:  [phone]  Name: Randee Coody MRN: [id number] Date of Birth: 09-Apr-1946

## 2019-09-29 ENCOUNTER — Telehealth (HOSPITAL_COMMUNITY): Payer: Self-pay

## 2019-09-29 NOTE — Telephone Encounter (Signed)
Pt can not find any gas to buy in [place] to get here- Lack of transportation

## 2019-09-30 ENCOUNTER — Encounter (HOSPITAL_COMMUNITY): Payer: Medicare Other

## 2019-10-04 ENCOUNTER — Telehealth (HOSPITAL_COMMUNITY): Payer: Self-pay | Admitting: Physical Therapy

## 2019-10-04 ENCOUNTER — Ambulatory Visit (HOSPITAL_COMMUNITY): Payer: Medicare Other | Admitting: Physical Therapy

## 2019-10-04 NOTE — Telephone Encounter (Signed)
Cx by phone tree - called pt to confirm no answer l/m. NF

## 2019-10-06 ENCOUNTER — Ambulatory Visit (HOSPITAL_COMMUNITY): Payer: Medicare Other

## 2019-10-06 ENCOUNTER — Telehealth (HOSPITAL_COMMUNITY): Payer: Self-pay

## 2019-10-06 NOTE — Telephone Encounter (Signed)
No show, called and spoke to pt who stated he continues to have stomach pain and has apt set up with [facility] on 10/11/19.  Stated he has been doing belly exercises at home and reports back is feeling better.  Reports some difficulty getting gas for car as well though reports it is gettting better.  Pt educated on no show policy details and reminded next apt date and time with contact information given.  Pt stated he thought he had cancelled apt for today via the reminder call.  Ihor Austin, LPTA/CLT; Delana Meyer [phone]

## 2019-10-10 ENCOUNTER — Ambulatory Visit (HOSPITAL_COMMUNITY): Payer: Medicare Other | Admitting: Physical Therapy

## 2019-10-10 ENCOUNTER — Other Ambulatory Visit: Payer: Self-pay

## 2019-10-10 ENCOUNTER — Encounter (HOSPITAL_COMMUNITY): Payer: Self-pay | Admitting: Physical Therapy

## 2019-10-10 DIAGNOSIS — M545 Low back pain, unspecified: Secondary | ICD-10-CM

## 2019-10-10 DIAGNOSIS — R2689 Other abnormalities of gait and mobility: Secondary | ICD-10-CM

## 2019-10-10 DIAGNOSIS — M6281 Muscle weakness (generalized): Secondary | ICD-10-CM

## 2019-10-10 NOTE — Therapy (Signed)
[place], [name], [address] Phone: [phone]   Fax:  [phone]  Physical Therapy Treatment  Patient Details  Name: Cloy Verhagen MRN: [id number] Date of Birth: 1946-02-24 Referring Provider (PT): Dixie Dials MD   Encounter Date: 10/10/2019  PT End of Session - 10/10/19 1049    Visit Number  3    Number of Visits  16    Date for PT Re-Evaluation  11/18/19    Authorization Type  BCBS Medicare No VL    Progress Note Due on Visit  10    PT Start Time  1045   arrived late   PT Stop Time  1115    PT Time Calculation (min)  30 min    Activity Tolerance  Patient tolerated treatment well    Behavior During Therapy  [facility] for tasks assessed/performed       Past Medical History:  Diagnosis Date  . Difficulty sleeping   . DVT (deep venous thrombosis) (HCC)    rt leg 3 yrs ago  . Hypertension   . Leg cramps   . Prostate cancer ([place])   . Swelling of lower extremity    RT LEG  . Varicose veins    both legs    Past Surgical History:  Procedure Laterality Date  . HERNIA REPAIR  2012   INGUINAL  . LYMPHADENECTOMY Bilateral 12/30/2013   Procedure: PELVIC LYMPH NODE DISSECTION;  Surgeon: Alexis Frock, MD;  Location: [facility];  Service: Urology;  Laterality: Bilateral;  . NASAL SINUS SURGERY    . ROBOT ASSISTED LAPAROSCOPIC RADICAL PROSTATECTOMY N/A 12/30/2013   Procedure: ROBOTIC ASSISTED LAPAROSCOPIC RADICAL PROSTATECTOMY WITH INDOCYANINE GREEN DYE;  Surgeon: Alexis Frock, MD;  Location: [facility];  Service: Urology;  Laterality: N/A;  . VEIN SURGERY     RT LEG    There were no vitals filed for this visit.  Subjective Assessment - 10/10/19 1048    Subjective  Patient says his back has not been bothering him. Says HEP is going alright. Says he has been having pain in his groin and lower stomach area.    Currently in Pain?  Yes    Pain Score  7     Pain Location  Groin    Pain Orientation  Anterior;Lower     Pain Descriptors / Indicators  Spasm;Sore    Pain Type  Acute pain    Pain Onset  More than a month ago    Pain Frequency  Intermittent    Aggravating Factors   bending, lifting, lifting legs    Pain Relieving Factors  resting, sitting, laying down    Effect of Pain on Daily Activities  limits                        [facility] Adult PT Treatment/Exercise - 10/10/19 0001      Lumbar Exercises: Supine   Ab Set  10 reps;5 seconds    Clam  10 reps    Bridge  10 reps    Bridge Limitations  5 sec hold             PT Education - 10/10/19 1112    Education Details  On activity modification, f/u with PCP and updated HEP    Person(s) Educated  Patient    Methods  Explanation;Handout    Comprehension  Verbalized understanding       PT Short Term Goals - 09/20/19 1422  PT SHORT TERM GOAL #1   Title  Patient will be independent with initial HEP and self-management strategies to improve functional outcomes    Time  4    Period  Weeks    Status  New    Target Date  10/21/19      PT SHORT TERM GOAL #2   Title  Patient will improve FOTO score by 5% to indicate improvement in functional outcomes    Time  4    Period  Weeks    Status  New    Target Date  10/21/19        PT Long Term Goals - 09/20/19 1422      PT LONG TERM GOAL #1   Title  Patient will improve FOTO score by10% to indicate improvement in functional outcomes    Time  8    Period  Weeks    Status  New    Target Date  11/18/19      PT LONG TERM GOAL #2   Title  Patient will report at least 75% overall improvement in subjective complaint to indicate improvement in ability to perform ADLs.    Time  8    Period  Weeks    Status  New    Target Date  11/18/19      PT LONG TERM GOAL #3   Title  Patient will be able to ambulate at least 350 feet during 2MWT with LRAD to demonstrate improved ability to perform functional mobility and associated tasks.    Time  8    Period  Weeks    Status   New    Target Date  11/18/19      PT LONG TERM GOAL #4   Title  Patient will have equal to or > 4+/5 MMT throughout BLE to improve ability to perform functional mobility, stair ambulation and ADLs.    Time  8    Period  Weeks    Status  New    Target Date  11/18/19      PT LONG TERM GOAL #5   Title  Patient will be able to perform stand x 5 in < 15 seconds to demonstrate improvement in functional mobility and reduced risk for falls.    Time  8    Period  Weeks    Status  New    Target Date  11/18/19            Plan - 10/10/19 1114    Clinical Impression Statement  Patient says he thinks he may have a hernia. Says his back is not really bothering him anymore, but has been having significant pain in lower abdomen/ pubic area. Says this is worse with lifting from floor, or when lifting leg to get in car. Also notes increased pain with coughing sneezing. Educated patient on activity modification to avoid excess strain to area and to follow up with primary care MD about this issue immediately. Will place patient on hold until further notice. Activity graded today per patient tolerance, avoiding and unsupported lower limb movement. patient also instructed to hold on prone and prone on elbow stretching until further notice.    Personal Factors and Comorbidities  Age;Comorbidity 2    Comorbidities  lymphedema, high blood pressure    Examination-Activity Limitations  Bathing;Carry;Dressing;Lift;Locomotion Level;Stairs;Squat;Stand;Transfers;Caring for Others;Bend    Examination-Participation Restrictions  Community Activity;Yard Work    Stability/Clinical Decision Making  Stable/Uncomplicated    Rehab Potential  Good  PT Frequency  2x / week    PT Duration  8 weeks    PT Treatment/Interventions  Manual lymph drainage;Patient/family education;Compression bandaging;Therapeutic exercise;ADLs/Self Care Home Management;Aquatic Therapy;Biofeedback;Cryotherapy;Electrical Stimulation;Iontophoresis  4mg /ml Dexamethasone;Moist Heat;Traction;Balance training;Manual techniques;Vestibular;Vasopneumatic Device;Taping;Splinting;Joint Manipulations;Spinal Manipulations;Passive range of motion;Scar mobilization;Neuromuscular re-education;Ultrasound;Cognitive remediation;Parrafin;Fluidtherapy;Contrast Bath;DME Instruction;Gait training;Dry needling;Stair training;Energy conservation;Prosthetic Training;Functional mobility training;Wheelchair mobility training;Therapeutic activities;Orthotic Fit/Training    PT Next Visit Plan  Pateint on hold until f.u with MD about possible hernia, will resume as indicated    PT Home Exercise Plan  09/20/19: POE; 10/10/19: ab set, supine clam, bridge    Consulted and Agree with Plan of Care  Patient       Patient will benefit from skilled therapeutic intervention in order to improve the following deficits and impairments:  Abnormal gait, Decreased activity tolerance, Difficulty walking, Decreased skin integrity, Increased edema, Hypomobility, Decreased endurance, Decreased strength, Pain, Decreased balance, Decreased mobility, Decreased range of motion, Improper body mechanics, Postural dysfunction, Impaired flexibility  Visit Diagnosis: Muscle weakness (generalized)  Low back pain without sciatica, unspecified back pain laterality, unspecified chronicity  Other abnormalities of gait and mobility     Problem List Patient Active Problem List   Diagnosis Date Noted  . Personal history of thromboembolic disease 123456  . Malnutrition of moderate degree ([place]) 01/12/2014  . Pyelonephritis 01/10/2014  . Acute pyelonephritis 01/10/2014  . Prostate cancer ([place]) 12/30/2013    11:18 AM, 10/10/19 Josue Hector PT DPT  Physical Therapist with [facility]  [phone] [place] [address] [place], [name], [address] Phone: [phone]   Fax:  [phone]  Name: Jaaziel Tellado MRN:  [id number] Date of Birth: [birth year]

## 2019-10-10 NOTE — Patient Instructions (Signed)
Access Code: [email or web address] URL: [email or web address] Date: 10/10/2019 Prepared by: Josue Hector  Exercises Supine Transversus Abdominis Bracing - Hands on Stomach - 2 x daily - 7 x weekly - 2 sets - 10 reps - 5 hold Bent Knee Fallouts - 2 x daily - 7 x weekly - 2 sets - 10 reps Supine Bridge - 2 x daily - 7 x weekly - 2 sets - 10 reps - 5 hold

## 2019-10-12 ENCOUNTER — Encounter (HOSPITAL_COMMUNITY): Payer: Medicare Other | Admitting: Physical Therapy

## 2019-10-18 ENCOUNTER — Encounter (HOSPITAL_COMMUNITY): Payer: Medicare Other | Admitting: Physical Therapy

## 2019-10-20 ENCOUNTER — Encounter (HOSPITAL_COMMUNITY): Payer: Medicare Other | Admitting: Physical Therapy

## 2019-10-24 ENCOUNTER — Encounter (HOSPITAL_COMMUNITY): Payer: Medicare Other | Admitting: Physical Therapy

## 2019-10-25 ENCOUNTER — Telehealth (HOSPITAL_COMMUNITY): Payer: Self-pay | Admitting: Physical Therapy

## 2019-10-25 NOTE — Telephone Encounter (Signed)
Pt will call his md and see him before he continues PT - pt will call MD for apptment next week and r/s for PT on 11/08/19.

## 2019-10-26 ENCOUNTER — Ambulatory Visit (HOSPITAL_COMMUNITY): Payer: Medicare Other | Admitting: Physical Therapy

## 2019-10-26 ENCOUNTER — Encounter (HOSPITAL_COMMUNITY): Payer: Medicare Other | Admitting: Physical Therapy

## 2019-10-28 ENCOUNTER — Telehealth (HOSPITAL_COMMUNITY): Payer: Self-pay

## 2019-10-28 NOTE — Telephone Encounter (Signed)
pt cancelled appt on 6/22 because he has to go to MD for hernia

## 2019-11-08 ENCOUNTER — Encounter (HOSPITAL_COMMUNITY): Payer: Medicare Other

## 2020-01-12 DIAGNOSIS — I890 Lymphedema, not elsewhere classified: Secondary | ICD-10-CM | POA: Insufficient documentation

## 2020-01-12 DIAGNOSIS — I872 Venous insufficiency (chronic) (peripheral): Secondary | ICD-10-CM | POA: Diagnosis present

## 2020-01-12 DIAGNOSIS — D509 Iron deficiency anemia, unspecified: Secondary | ICD-10-CM | POA: Insufficient documentation

## 2020-02-23 ENCOUNTER — Encounter (HOSPITAL_BASED_OUTPATIENT_CLINIC_OR_DEPARTMENT_OTHER): Payer: Medicare Other | Attending: Physician Assistant | Admitting: Internal Medicine

## 2020-02-23 DIAGNOSIS — L97812 Non-pressure chronic ulcer of other part of right lower leg with fat layer exposed: Secondary | ICD-10-CM | POA: Insufficient documentation

## 2020-02-23 DIAGNOSIS — I890 Lymphedema, not elsewhere classified: Secondary | ICD-10-CM | POA: Diagnosis not present

## 2020-02-23 DIAGNOSIS — I10 Essential (primary) hypertension: Secondary | ICD-10-CM | POA: Insufficient documentation

## 2020-02-23 DIAGNOSIS — L97822 Non-pressure chronic ulcer of other part of left lower leg with fat layer exposed: Secondary | ICD-10-CM | POA: Insufficient documentation

## 2020-02-23 NOTE — Progress Notes (Signed)
[name], [name] ([id number]) Visit Report for 02/23/2020 Abuse/Suicide Risk Screen Details Patient Name: Date of Service: [name], [name] 02/23/2020 10:30 A M Medical Record Number: [id number] Patient Account Number: [id number] Date of Birth/Sex: Treating RN: Aug 18, 1945 (74 y.o. [name] Primary Care [name]: [name] Other Clinician: Referring [name]: Treating [name]/Extender: [name] Weeks in Treatment: 0 Abuse/Suicide Risk Screen Items Answer ABUSE RISK SCREEN: Has anyone close to you tried to hurt or harm you recentlyo No Do you feel uncomfortable with anyone in your familyo No Has anyone forced you do things that you didnt want to doo No Electronic Signature(s) Signed: 02/23/2020 5:54:48 PM By: [name] Entered By: [name] on 02/23/2020 11:37:53 -------------------------------------------------------------------------------- Activities of Daily Living Details Patient Name: Date of Service: [name], [name] 02/23/2020 10:30 A M Medical Record Number: [id number] Patient Account Number: [id number] Date of Birth/Sex: Treating RN: 1945-06-24 (74 y.o. [name] Primary Care [name]: [name] Other Clinician: Referring [name]: Treating [name]/Extender: [name] Weeks in Treatment: 0 Activities of Daily Living Items Answer Activities of Daily Living (Please select one for each item) Drive Automobile Completely Able T Medications ake Completely Able Use T elephone Completely Able Care for Appearance Completely Able Use T oilet Completely Able Bath / Shower Completely Able Dress Self Completely Able Feed Self Completely Able Walk Completely Able Get In / Out Bed Completely Able Housework Need Assistance Prepare Meals Need Assistance Handle Money Completely Able Shop for Self Need Assistance Electronic Signature(s) Signed: 02/23/2020 5:54:48 PM By: [name] Entered By: [name] on 02/23/2020 11:38:16 -------------------------------------------------------------------------------- Education Screening Details Patient Name: Date of Service: [name], [name] 02/23/2020 10:30 A M Medical Record Number: [id number] Patient Account Number: [id number] Date of Birth/Sex: Treating RN: 1945-10-13 (74 y.o. [name] Primary Care [name]: [name] Other Clinician: Referring [name]: Treating [name]/Extender: [name] in Treatment: 0 Primary Learner Assessed: Patient Learning Preferences/Education Level/Primary Language Learning Preference: Explanation Preferred Language: English Cognitive Barrier Language Barrier: No Translator Needed: No Memory Deficit: No Emotional Barrier: No Cultural/Religious Beliefs Affecting Medical Care: No Physical Barrier Impaired Vision: No Impaired Hearing: No Decreased Hand dexterity: No Knowledge/Comprehension Knowledge Level: Medium Comprehension Level: Medium Ability to understand written instructions: Medium Ability to understand verbal instructions: Medium Motivation Anxiety Level: Calm Cooperation: Cooperative Education Importance: Acknowledges Need Interest in Health Problems: Asks Questions Perception: Coherent Willingness to Engage in Self-Management High Activities: Readiness to Engage in Self-Management High Activities: Electronic Signature(s) Signed: 02/23/2020 5:54:48 PM By: [name] Entered By: [name] on 02/23/2020 11:39:22 -------------------------------------------------------------------------------- Fall Risk Assessment Details Patient Name: Date of Service: [name], [name] 02/23/2020 10:30 A M Medical Record Number: [id number] Patient Account Number: [id number] Date of Birth/Sex: Treating RN: 13-Jan-1946 (74 y.o. [name] Primary Care [name]: [name] Other  Clinician: Referring [name]: Treating [name]/Extender: [name] Weeks in Treatment: 0 Fall Risk Assessment Items Have you had 2 or more falls in the last 12 monthso 0 No Have you had any fall that resulted in injury in the last 12 monthso 0 No FALLS RISK SCREEN History of falling - immediate or within 3 months 0 No Secondary diagnosis (Do you have 2 or more medical diagnoseso) 0 No Ambulatory aid None/bed rest/wheelchair/nurse 0 Yes Crutches/cane/walker 0 No Furniture 0 No Intravenous therapy Access/Saline/Heparin Lock 0 No Gait/Transferring Normal/ bed rest/ wheelchair 0 Yes Weak (short steps with or without shuffle, stooped but able to  lift head while walking, may seek 0 No support from furniture) Impaired (short steps with shuffle, may have difficulty arising from chair, head down, impaired 0 No balance) Mental Status Oriented to own ability 0 Yes Electronic Signature(s) Signed: 02/23/2020 5:54:48 PM By: [name] Entered By: [name] on 02/23/2020 11:39:36 -------------------------------------------------------------------------------- Foot Assessment Details Patient Name: Date of Service: [name], [name] 02/23/2020 10:30 A M Medical Record Number: [id number] Patient Account Number: [id number] Date of Birth/Sex: Treating RN: November 18, 1945 (74 y.o. [name] Primary Care [name]: [name] Other Clinician: Referring [name]: Treating [name]/Extender: [name] Weeks in Treatment: 0 Foot Assessment Items Site Locations + = Sensation present, - = Sensation absent, C = Callus, U = Ulcer R = Redness, W = Warmth, M = Maceration, PU = Pre-ulcerative lesion F = Fissure, S = Swelling, D = Dryness Assessment Right: Left: Other Deformity: No No Prior Foot Ulcer: No No Prior Amputation: No No Charcot Joint: No No Ambulatory Status: Ambulatory Without Help Gait: Steady Electronic  Signature(s) Signed: 02/23/2020 5:54:48 PM By: [name] Entered By: [name] on 02/23/2020 11:39:59 -------------------------------------------------------------------------------- Nutrition Risk Screening Details Patient Name: Date of Service: [name], [name] 02/23/2020 10:30 A M Medical Record Number: [id number] Patient Account Number: [id number] Date of Birth/Sex: Treating RN: [birth year] (74 y.o. [name] Primary Care Kendy Haston: [name] Other Clinician: Referring Eiliyah Reh: Treating Lynette Noah/Extender: [name] Weeks in Treatment: 0 Height (in): Weight (lbs): Body Mass Index (BMI): Nutrition Risk Screening Items Score Screening NUTRITION RISK SCREEN: I have an illness or condition that made me change the kind and/or amount of food I eat 0 No I eat fewer than two meals per day 0 No I eat few fruits and vegetables, or milk products 0 No I have three or more drinks of beer, liquor or wine almost every day 0 No I have tooth or mouth problems that make it hard for me to eat 0 No I don't always have enough money to buy the food I need 0 No I eat alone most of the time 0 No I take three or more different prescribed or over-the-counter drugs a day 1 Yes Without wanting to, I have lost or gained 10 pounds in the last six months 0 No I am not always physically able to shop, cook and/or feed myself 0 No Nutrition Protocols Good Risk Protocol 0 No interventions needed Moderate Risk Protocol High Risk Proctocol Risk Level: Good Risk Score: 1 Electronic Signature(s) Signed: 02/23/2020 5:54:48 PM By: [name] Entered By: [name] on 02/23/2020 11:39:50

## 2020-02-24 NOTE — Progress Notes (Signed)
Geoffroy, RAYLEN ([id number]) Visit Report for 02/23/2020 Allergy List Details Patient Name: Date of Service: Bouwman, [name] 02/23/2020 10:30 A M Medical Record Number: [id number] Patient Account Number: [id number] Date of Birth/Sex: Treating RN: Feb 09, 1946 (74 y.o. Marvis Repress Primary Care Provider: [name] Other Clinician: Referring Provider: Treating Provider/Extender: Salvadore Oxford, [name] Weeks in Treatment: 0 Allergies Active Allergies cephalexin Severity: Moderate mometasone furoate Severity: Moderate Allergy Notes Electronic Signature(s) Signed: 02/23/2020 5:54:48 PM By: Kela Millin Entered By: Kela Millin on 02/23/2020 11:33:01 -------------------------------------------------------------------------------- Arrival Information Details Patient Name: Date of Service: Recchia, [name] 02/23/2020 10:30 A M Medical Record Number: [id number] Patient Account Number: [id number] Date of Birth/Sex: Treating RN: April 05, 1946 (74 y.o. Marvis Repress Primary Care Provider: [name] Other Clinician: Referring Provider: Treating Provider/Extender: Alycia Rossetti in Treatment: 0 Visit Information Patient Arrived: Ambulatory Arrival Time: 11:15 Accompanied By: wife Transfer Assistance: None Patient Identification Verified: Yes Secondary Verification Process Completed: Yes Patient Has Alerts: Yes Patient Alerts: Patient on Blood Thinner on warfarin Bilateral ABI: [place] Electronic Signature(s) Signed: 02/23/2020 5:54:48 PM By: Kela Millin Entered By: Kela Millin on 02/23/2020 12:03:46 -------------------------------------------------------------------------------- Clinic Level of Care Assessment Details Patient Name: Date of Service: Mand, [name] 02/23/2020 10:30 A M Medical Record Number: [id number] Patient Account Number: [id number] Date of Birth/Sex: Treating RN: 1946-04-11 (74 y.o. [name] Primary Care Provider: [name] Other Clinician: Referring Provider: Treating Provider/Extender: Drucilla Schmidt Weeks in Treatment: 0 Clinic Level of Care Assessment Items TOOL 1 Quantity Score X- 1 0 Use when EandM and Procedure is performed on INITIAL visit ASSESSMENTS - Nursing Assessment / Reassessment X- 1 20 General Physical Exam (combine w/ comprehensive assessment (listed just below) when performed on new pt. evals) X- 1 25 Comprehensive Assessment (HX, ROS, Risk Assessments, Wounds Hx, etc.) ASSESSMENTS - Wound and Skin Assessment / Reassessment X- 1 10 Dermatologic / Skin Assessment (not related to wound area) ASSESSMENTS - Ostomy and/or Continence Assessment and Care [] - 0 Incontinence Assessment and Management [] - 0 Ostomy Care Assessment and Management (repouching, etc.) PROCESS - Coordination of Care [] - 0 Simple Patient / Family Education for ongoing care X- 1 20 Complex (extensive) Patient / Family Education for ongoing care X- 1 10 Staff obtains Programmer, systems, Records, T Results / Process Orders est [] - 0 Staff telephones HHA, Nursing Homes / Clarify orders / etc [] - 0 Routine Transfer to another Facility (non-emergent condition) [] - 0 Routine Hospital Admission (non-emergent condition) X- 1 15 New Admissions / Biomedical engineer / Ordering NPWT Apligraf, etc. , [] - 0 Emergency Hospital Admission (emergent condition) PROCESS - Special Needs [] - 0 Pediatric / Minor Patient Management [] - 0 Isolation Patient Management [] - 0 Hearing / Language / Visual special needs [] - 0 Assessment of Community assistance (transportation, D/C planning, etc.) [] - 0 Additional assistance / Altered mentation [] - 0 Support Surface(s) Assessment (bed, cushion, seat, etc.) INTERVENTIONS - Miscellaneous [] - 0 External ear exam [] - 0 Patient Transfer (multiple staff / Civil Service fast streamer / Similar devices) [] - 0 Simple Staple /  Suture removal (25 or less) [] - 0 Complex Staple / Suture removal (26 or more) [] - 0 Hypo/Hyperglycemic Management (do not check if billed separately) X- 1 15 Ankle / Brachial Index (ABI) - do not check if billed separately Has the patient been seen at the hospital within the last three years:  Yes Total Score: 115 Level Of Care: New/Established - Level 3 Electronic Signature(s) Signed: 02/23/2020 6:22:31 PM By: Deon Pilling Signed: 02/23/2020 6:22:31 PM By: Deon Pilling Entered By: Deon Pilling on 02/23/2020 12:35:08 -------------------------------------------------------------------------------- Compression Therapy Details Patient Name: Date of Service: [place], [name] 02/23/2020 10:30 A M Medical Record Number: [id number] Patient Account Number: [id number] Date of Birth/Sex: Treating RN: [birth year] (74 y.o. [name] Primary Care Provider: [name] Other Clinician: Referring Provider: Treating Provider/Extender: Drucilla Schmidt Weeks in Treatment: 0 Compression Therapy Performed for Wound Assessment: Wound #1 Right,Medial Lower Leg Performed By: Clinician Levan Hurst, RN Compression Type: Four Layer Post Procedure Diagnosis Same as Pre-procedure Electronic Signature(s) Signed: 02/23/2020 6:22:31 PM By: Deon Pilling Entered By: Deon Pilling on 02/23/2020 12:41:35 -------------------------------------------------------------------------------- Compression Therapy Details Patient Name: Date of Service: Phung, [name] 02/23/2020 10:30 A M Medical Record Number: [id number] Patient Account Number: [id number] Date of Birth/Sex: Treating RN: [birth year] (74 y.o. [name] Primary Care Provider: [name] Other Clinician: Referring Provider: Treating Provider/Extender: Drucilla Schmidt Weeks in Treatment: 0 Compression Therapy Performed for Wound Assessment: Wound #2 Left,Lateral Lower Leg Performed By:  Clinician Levan Hurst, RN Compression Type: Four Layer Post Procedure Diagnosis Same as Pre-procedure Electronic Signature(s) Signed: 02/23/2020 6:22:31 PM By: Deon Pilling Entered By: Deon Pilling on 02/23/2020 12:41:36 -------------------------------------------------------------------------------- Compression Therapy Details Patient Name: Date of Service: [place], [name] 02/23/2020 10:30 A M Medical Record Number: [id number] Patient Account Number: [id number] Date of Birth/Sex: Treating RN: [birth year] (74 y.o. [name] Primary Care Provider: [name] Other Clinician: Referring Provider: Treating Provider/Extender: Drucilla Schmidt Weeks in Treatment: 0 Compression Therapy Performed for Wound Assessment: Wound #3 Left,Anterior Lower Leg Performed By: Clinician Levan Hurst, RN Compression Type: Four Layer Post Procedure Diagnosis Same as Pre-procedure Electronic Signature(s) Signed: 02/23/2020 6:22:31 PM By: Deon Pilling Entered By: Deon Pilling on 02/23/2020 12:41:36 -------------------------------------------------------------------------------- Compression Therapy Details Patient Name: Date of Service: Briseno, [name] 02/23/2020 10:30 A M Medical Record Number: [id number] Patient Account Number: [id number] Date of Birth/Sex: Treating RN: September 13, 1945 (74 y.o. [name] Primary Care Provider: [name] Other Clinician: Referring Provider: Treating Provider/Extender: Drucilla Schmidt Weeks in Treatment: 0 Compression Therapy Performed for Wound Assessment: Wound #4 Left,Proximal,Medial Lower Leg Performed By: Clinician Levan Hurst, RN Compression Type: Four Layer Post Procedure Diagnosis Same as Pre-procedure Electronic Signature(s) Signed: 02/23/2020 6:22:31 PM By: Deon Pilling Entered By: Deon Pilling on 02/23/2020  12:41:36 -------------------------------------------------------------------------------- Compression Therapy Details Patient Name: Date of Service: Piatkowski, [name] 02/23/2020 10:30 A M Medical Record Number: [id number] Patient Account Number: [id number] Date of Birth/Sex: Treating RN: 14-Jul-1945 (74 y.o. [name] Primary Care Provider: [name] Other Clinician: Referring Provider: Treating Provider/Extender: Drucilla Schmidt Weeks in Treatment: 0 Compression Therapy Performed for Wound Assessment: Wound #5 Left,Distal,Medial Lower Leg Performed By: Clinician Levan Hurst, RN Compression Type: Four Layer Post Procedure Diagnosis Same as Pre-procedure Electronic Signature(s) Signed: 02/23/2020 6:22:31 PM By: Deon Pilling Entered By: Deon Pilling on 02/23/2020 12:41:36 -------------------------------------------------------------------------------- Encounter Discharge Information Details Patient Name: Date of Service: [place], [name] 02/23/2020 10:30 A M Medical Record Number: [id number] Patient Account Number: [id number] Date of Birth/Sex: Treating RN: [birth year] (74 y.o. Janyth Contes Primary Care Provider: [name] Other Clinician: Referring Provider: Treating Provider/Extender: Drucilla Schmidt Weeks in Treatment: 0 Encounter Discharge Information Items Post Procedure Vitals Discharge Condition: Stable Temperature (F):  98 Ambulatory Status: Ambulatory Pulse (bpm): 84 Discharge Destination: Home Respiratory Rate (breaths/min): 19 Transportation: Private Auto Blood Pressure (mmHg): 195/80 Accompanied By: wife Schedule Follow-up Appointment: Yes Clinical Summary of Care: Patient Declined Electronic Signature(s) Signed: 02/24/2020 5:41:32 PM By: Levan Hurst RN, [name] Entered By: Levan Hurst on 02/23/2020 14:52:42 -------------------------------------------------------------------------------- Lower  Extremity Assessment Details Patient Name: Date of Service: Kiner, [name] 02/23/2020 10:30 A M Medical Record Number: [id number] Patient Account Number: [id number] Date of Birth/Sex: Treating RN: 05-Nov-1945 (74 y.o. Marvis Repress Primary Care Nou Chard: [name] Other Clinician: Referring Morning [name]: Treating Laytoya Ion/Extender: Drucilla Schmidt Weeks in Treatment: 0 Edema Assessment Assessed: [name]: No] [Right: No] E[Left: dema] [Right: :] Calf Left: Right: Point of Measurement: 46 cm From Medial Instep 49 cm 58 cm Ankle Left: Right: Point of Measurement: 15 cm From Medial Instep 39 cm 39 cm Vascular Assessment Pulses: Dorsalis Pedis Palpable: [Left:No] [Right:No] Electronic Signature(s) Signed: 02/23/2020 5:54:48 PM By: Kela Millin Entered By: Kela Millin on 02/23/2020 11:41:28 -------------------------------------------------------------------------------- Multi Wound Chart Details Patient Name: Date of Service: [place], [name] 02/23/2020 10:30 A M Medical Record Number: [id number] Patient Account Number: [id number] Date of Birth/Sex: Treating RN: [birth year] (74 y.o. [name] Primary Care Aela Bohan: [name] Other Clinician: Referring Lesbia Ottaway: Treating Olita Takeshita/Extender: Drucilla Schmidt Weeks in Treatment: 0 Vital Signs Height(in): Pulse(bpm): 56 Weight(lbs): Blood Pressure(mmHg): 195/80 Body Mass Index(BMI): Temperature(F): 98 Respiratory Rate(breaths/min): 19 Photos: [1:No Photos Right, Medial Lower Leg] [2:No Photos Left, Lateral Lower Leg] [3:No Photos Left, Anterior Lower Leg] Wound Location: [1:Blister] [2:Blister] [3:Blister] Wounding Event: [1:Lymphedema] [2:Lymphedema] [3:Lymphedema] Primary Etiology: [1:Lymphedema, Deep Vein Thrombosis, Lymphedema, Deep Vein Thrombosis, Lymphedema, Deep Vein Thrombosis,] Comorbid History: [1:Hypertension, Received Radiation 01/15/2020]  [2:Hypertension, Received Radiation 01/15/2020] [3:Hypertension, Received Radiation 01/15/2020] Date Acquired: [1:0] [2:0] [3:0] Weeks of Treatment: [1:Open] [2:Open] [3:Open] Wound Status: [1:0.5x2x0.1] [2:2x2.2x1.5] [3:0.9x1.9x1.2] Measurements L x W x D (cm) [1:0.785] [2:3.456] [3:1.343] A (cm) : rea [1:0.079] [2:5.184] [3:1.612] Volume (cm) : [1:Full Thickness Without Exposed] [2:Full Thickness Without Exposed] [3:Full Thickness Without Exposed] Classification: [1:Support Structures Medium] [2:Support Structures Large] [3:Support Structures Large] Exudate A mount: [1:Serous] [2:Serous] [3:Serous] Exudate Type: [1:amber] [2:amber] [3:amber] Exudate Color: [1:Distinct, outline attached] [2:Well defined, not attached] [3:Well defined, not attached] Wound Margin: [1:Small (1-33%)] [2:Medium (34-66%)] [3:Small (1-33%)] Granulation A mount: [1:Pink] [2:Pink] [3:Pink] Granulation Quality: [1:Large (67-100%)] [2:Medium (34-66%)] [3:Large (67-100%)] Necrotic A mount: [1:Fat Layer (Subcutaneous Tissue): Yes Fat Layer (Subcutaneous Tissue): Yes Fat Layer (Subcutaneous Tissue): Yes] Exposed Structures: [1:Fascia: No Tendon: No Muscle: No Joint: No Bone: No None] [2:Fascia: No Tendon: No Muscle: No Joint: No Bone: No None] [3:Fascia: No Tendon: No Muscle: No Joint: No Bone: No None] Epithelialization: [1:Debridement - Excisional] [2:Debridement - Excisional] [3:Debridement - Excisional] Debridement: Pre-procedure Verification/Time Out 12:30 [2:12:30] [3:12:30] Taken: [1:Lidocaine 4% Topical Solution] [2:Lidocaine 4% Topical Solution] [3:Lidocaine 4% Topical Solution] Pain Control: [1:Subcutaneous, Slough] [2:Subcutaneous, Slough] [3:Subcutaneous, Slough] Tissue Debrided: [1:Skin/Subcutaneous Tissue] [2:Skin/Subcutaneous Tissue] [3:Skin/Subcutaneous Tissue] Level: [1:1] [2:4.4] [3:1.71] Debridement A (sq cm): [1:rea Curette] [2:Curette] [3:Curette] Instrument: [1:Minimum] [2:Minimum]  [3:Minimum] Bleeding: [1:Pressure] [2:Pressure] [3:Pressure] Hemostasis A chieved: [1:0] [2:0] [3:0] Procedural Pain: [1:0] [2:0] [3:0] Post Procedural Pain: [1:Procedure was tolerated well] [2:Procedure was tolerated well] [3:Procedure was tolerated well] Debridement Treatment Response: [1:0.5x2x0.1] [2:2x2.2x1.5] [3:0.9x1.9x1.2] Post Debridement Measurements L x W x D (cm) [1:0.079] [2:5.184] [3:1.612] Post Debridement Volume: (cm) [1:Compression Therapy] [2:Compression Therapy] [3:Compression Therapy] Procedures Performed: [1:Debridement] [2:Debridement 4] [3:Debridement 5 N/A] Photos: [1:No Photos Left, Proximal, Medial Lower  Leg] [2:No Photos Left, Distal, Medial Lower Leg] [3:N/A N/A] Wound Location: [1:Blister] [2:Blister] [3:N/A] Wounding Event: [1:Lymphedema] [2:Lymphedema] [3:N/A] Primary Etiology: [1:Lymphedema, Deep Vein Thrombosis,] [2:Lymphedema, Deep Vein Thrombosis,] [3:N/A] Comorbid History: [1:Hypertension, Received Radiation 01/15/2020] [2:Hypertension, Received Radiation 01/15/2020] [3:N/A] Date Acquired: [1:0] [2:0] [3:N/A] Weeks of Treatment: [1:Open] [2:Open] [3:N/A] Wound Status: [1:2x1.6x0.9] [2:0.5x0.5x0.5] [3:N/A] Measurements L x W x D (cm) [1:2.513] [2:0.196] [3:N/A] A (cm) : rea [1:2.262] [2:0.098] [3:N/A] Volume (cm) : [1:Full Thickness Without Exposed] [2:Full Thickness Without Exposed] [3:N/A] Classification: [1:Support Structures Large] [2:Support Structures Small] [3:N/A] Exudate Amount: [1:Serous] [2:Serous] [3:N/A] Exudate Type: [1:amber] [2:amber] [3:N/A] Exudate Color: [1:Well defined, not attached] [2:Distinct, outline attached] [3:N/A] Wound Margin: [1:Medium (34-66%)] [2:Medium (34-66%)] [3:N/A] Granulation Amount: [1:Pink] [2:Pink] [3:N/A] Granulation Quality: [1:Medium (34-66%)] [2:Medium (34-66%)] [3:N/A] Necrotic Amount: [1:Fat Layer (Subcutaneous Tissue): Yes Fat Layer (Subcutaneous Tissue): Yes N/A] Exposed Structures: [1:Fascia: No  Tendon: No Muscle: No Joint: No Bone: No None] [2:Fascia: No Tendon: No Muscle: No Joint: No Bone: No None] [3:N/A] Epithelialization: [1:Debridement - Excisional] [2:N/A] [3:N/A] Debridement: Pre-procedure Verification/Time Out 12:30 [2:N/A] [3:N/A] Taken: [1:Lidocaine 4% Topical Solution] [2:N/A] [3:N/A] Pain Control: [1:Subcutaneous, Slough] [2:N/A] [3:N/A] Tissue Debrided: [1:Skin/Subcutaneous Tissue] [2:N/A] [3:N/A] Level: [1:3.2] [2:N/A] [3:N/A] Debridement A (sq cm): [1:rea Curette] [2:N/A] [3:N/A] Instrument: [1:Minimum] [2:N/A] [3:N/A] Bleeding: [1:Pressure] [2:N/A] [3:N/A] Hemostasis A chieved: [1:0] [2:N/A] [3:N/A] Procedural Pain: [1:0] [2:N/A] [3:N/A] Post Procedural Pain: [1:Procedure was tolerated well] [2:N/A] [3:N/A] Debridement Treatment Response: [1:2x1.6x0.9] [2:N/A] [3:N/A] Post Debridement Measurements L x W x D (cm) [1:2.262] [2:N/A] [3:N/A] Post Debridement Volume: (cm) [1:Compression Therapy] [2:Compression Therapy] [3:N/A] Procedures Performed: [1:Debridement] Treatment Notes Electronic Signature(s) Signed: 02/23/2020 6:22:31 PM By: Deon Pilling Signed: 02/24/2020 8:12:45 AM By: Linton Ham MD Entered By: Linton Ham on 02/23/2020 13:03:16 -------------------------------------------------------------------------------- Multi-Disciplinary Care Plan Details Patient Name: Date of Service: Troiano, [name] 02/23/2020 10:30 A M Medical Record Number: [id number] Patient Account Number: [id number] Date of Birth/Sex: Treating RN: 31-May-1945 (74 y.o. [name], [name] Primary Care Provider: [name] Other Clinician: Referring Provider: Treating Provider/Extender: Drucilla Schmidt Weeks in Treatment: 0 Active Inactive Nutrition Nursing Diagnoses: Potential for alteratiion in Nutrition/Potential for imbalanced nutrition Goals: Patient/caregiver agrees to and verbalizes understanding of need to obtain nutritional consultation Date  Initiated: 02/23/2020 Target Resolution Date: 03/23/2020 Goal Status: Active Interventions: Provide education on nutrition Treatment Activities: Patient referred to Primary Care Physician for further nutritional evaluation : 02/23/2020 Notes: Orientation to the Wound Care Program Nursing Diagnoses: Knowledge deficit related to the wound healing center program Goals: Patient/caregiver will verbalize understanding of the [facility] Program Date Initiated: 02/23/2020 Target Resolution Date: 03/23/2020 Goal Status: Active Interventions: Provide education on orientation to the wound center Notes: Pain, Acute or Chronic Nursing Diagnoses: Pain, acute or chronic: actual or potential Potential alteration in comfort, pain Goals: Patient will verbalize adequate pain control and receive pain control interventions during procedures as needed Date Initiated: 02/23/2020 Target Resolution Date: 03/23/2020 Goal Status: Active Patient/caregiver will verbalize comfort level met Date Initiated: 02/23/2020 Target Resolution Date: 03/23/2020 Goal Status: Active Interventions: Provide education on pain management Reposition patient for comfort Notes: Wound/Skin Impairment Nursing Diagnoses: Knowledge deficit related to ulceration/compromised skin integrity Goals: Ulcer/skin breakdown will heal within 14 weeks Date Initiated: 02/23/2020 Target Resolution Date: 06/01/2020 Goal Status: Active Interventions: Assess patient/caregiver ability to obtain necessary supplies Assess patient/caregiver ability to perform ulcer/skin care regimen upon admission and as needed Provide education on ulcer and skin care Treatment Activities: Skin care regimen initiated : 02/23/2020 Topical  wound management initiated : 02/23/2020 Notes: Electronic Signature(s) Signed: 02/23/2020 6:22:31 PM By: Deon Pilling Entered By: Deon Pilling on 02/23/2020  12:33:42 -------------------------------------------------------------------------------- Patient/Caregiver Education Details Patient Name: Date of Service: Swaminathan, [name] 10/7/2021andnbsp10:30 A M Medical Record Number: [id number] Patient Account Number: [id number] Date of Birth/Gender: Treating RN: September 10, 1945 (74 y.o. [name] Primary Care Physician: [name] Other Clinician: Referring Physician: Treating Physician/Extender: Alycia Rossetti in Treatment: 0 Education Assessment Education Provided To: Patient and Caregiver Education Topics Provided Welcome T [facility]: o Handouts: Welcome T [facility] o Methods: Explain/Verbal, Printed Responses: Reinforcements needed Electronic Signature(s) Signed: 02/23/2020 6:22:31 PM By: Deon Pilling Entered By: Deon Pilling on 02/23/2020 12:34:01 -------------------------------------------------------------------------------- Wound Assessment Details Patient Name: Date of Service: [place], [name] 02/23/2020 10:30 A M Medical Record Number: [id number] Patient Account Number: [id number] Date of Birth/Sex: Treating RN: 08-Oct-1945 (74 y.o. Marvis Repress Primary Care Provider: [name] Other Clinician: Referring Provider: Treating Provider/Extender: Drucilla Schmidt Weeks in Treatment: 0 Wound Status Wound Number: 1 Primary Lymphedema Etiology: Wound Location: Right, Medial Lower Leg Wound Status: Open Wounding Event: Blister Comorbid Lymphedema, Deep Vein Thrombosis, Hypertension, Date Acquired: 01/15/2020 History: Received Radiation Weeks Of Treatment: 0 Clustered Wound: No Wound Measurements Length: (cm) 0.5 Width: (cm) 2 Depth: (cm) 0.1 Area: (cm) 0.785 Volume: (cm) 0.079 % Reduction in Area: % Reduction in Volume: Epithelialization: None Tunneling: No Undermining: No Wound Description Classification: Full Thickness Without  Exposed Support Structures Wound Margin: Distinct, outline attached Exudate Amount: Medium Exudate Type: Serous Exudate Color: amber Foul Odor After Cleansing: No Slough/Fibrino Yes Wound Bed Granulation Amount: Small (1-33%) Exposed Structure Granulation Quality: Pink Fascia Exposed: No Necrotic Amount: Large (67-100%) Fat Layer (Subcutaneous Tissue) Exposed: Yes Necrotic Quality: Adherent Slough Tendon Exposed: No Muscle Exposed: No Joint Exposed: No Bone Exposed: No Treatment Notes Wound #1 (Right, Medial Lower Leg) 1. Cleanse With Soap and water 2. Periwound Care Moisturizing lotion 3. Primary Dressing Applied Calcium Alginate Ag 4. Secondary Dressing ABD Pad Dry Gauze 6. Support Layer Applied 4 layer compression wrap Electronic Signature(s) Signed: 02/23/2020 5:54:48 PM By: Kela Millin Entered By: Kela Millin on 02/23/2020 11:51:35 -------------------------------------------------------------------------------- Wound Assessment Details Patient Name: Date of Service: Si, [name] 02/23/2020 10:30 A M Medical Record Number: [id number] Patient Account Number: [id number] Date of Birth/Sex: Treating RN: 06-21-1945 (74 y.o. Marvis Repress Primary Care Provider: [name] Other Clinician: Referring Provider: Treating Provider/Extender: Drucilla Schmidt Weeks in Treatment: 0 Wound Status Wound Number: 2 Primary Lymphedema Etiology: Wound Location: Left, Lateral Lower Leg Wound Status: Open Wounding Event: Blister Comorbid Lymphedema, Deep Vein Thrombosis, Hypertension, Date Acquired: 01/15/2020 History: Received Radiation Weeks Of Treatment: 0 Clustered Wound: No Wound Measurements Length: (cm) 2 Width: (cm) 2.2 Depth: (cm) 1.5 Area: (cm) 3.456 Volume: (cm) 5.184 % Reduction in Area: % Reduction in Volume: Epithelialization: None Tunneling: No Undermining: No Wound Description Classification: Full Thickness  Without Exposed Support Structures Wound Margin: Well defined, not attached Exudate Amount: Large Exudate Type: Serous Exudate Color: amber Foul Odor After Cleansing: No Slough/Fibrino Yes Wound Bed Granulation Amount: Medium (34-66%) Exposed Structure Granulation Quality: Pink Fascia Exposed: No Necrotic Amount: Medium (34-66%) Fat Layer (Subcutaneous Tissue) Exposed: Yes Necrotic Quality: Adherent Slough Tendon Exposed: No Muscle Exposed: No Joint Exposed: No Bone Exposed: No Treatment Notes Wound #2 (Left, Lateral Lower Leg) 1. Cleanse With Soap and water 2. Periwound Care Moisturizing lotion 3. Primary Dressing  Applied Calcium Alginate Ag 4. Secondary Dressing ABD Pad Dry Gauze 6. Support Layer Applied 4 layer compression wrap Electronic Signature(s) Signed: 02/23/2020 5:54:48 PM By: Kela Millin Entered By: Kela Millin on 02/23/2020 11:52:48 -------------------------------------------------------------------------------- Wound Assessment Details Patient Name: Date of Service: [place], [name] 02/23/2020 10:30 A M Medical Record Number: [id number] Patient Account Number: [id number] Date of Birth/Sex: Treating RN: May 04, 1946 (74 y.o. Marvis Repress Primary Care Ulla Mckiernan: [name] Other Clinician: Referring Keily Lepp: Treating Thaddeus Evitts/Extender: Drucilla Schmidt Weeks in Treatment: 0 Wound Status Wound Number: 3 Primary Lymphedema Etiology: Wound Location: Left, Anterior Lower Leg Wound Status: Open Wounding Event: Blister Comorbid Lymphedema, Deep Vein Thrombosis, Hypertension, Date Acquired: 01/15/2020 History: Received Radiation Weeks Of Treatment: 0 Clustered Wound: No Wound Measurements Length: (cm) 0.9 Width: (cm) 1.9 Depth: (cm) 1.2 Area: (cm) 1.343 Volume: (cm) 1.612 % Reduction in Area: % Reduction in Volume: Epithelialization: None Tunneling: No Undermining: No Wound Description Classification:  Full Thickness Without Exposed Support Structures Wound Margin: Well defined, not attached Exudate Amount: Large Exudate Type: Serous Exudate Color: amber Foul Odor After Cleansing: No Slough/Fibrino Yes Wound Bed Granulation Amount: Small (1-33%) Exposed Structure Granulation Quality: Pink Fascia Exposed: No Necrotic Amount: Large (67-100%) Fat Layer (Subcutaneous Tissue) Exposed: Yes Necrotic Quality: Adherent Slough Tendon Exposed: No Muscle Exposed: No Joint Exposed: No Bone Exposed: No Treatment Notes Wound #3 (Left, Anterior Lower Leg) 1. Cleanse With Soap and water 2. Periwound Care Moisturizing lotion 3. Primary Dressing Applied Calcium Alginate Ag 4. Secondary Dressing ABD Pad Dry Gauze 6. Support Layer Applied 4 layer compression wrap Electronic Signature(s) Signed: 02/23/2020 5:54:48 PM By: Kela Millin Entered By: Kela Millin on 02/23/2020 11:54:12 -------------------------------------------------------------------------------- Wound Assessment Details Patient Name: Date of Service: [place], [name] 02/23/2020 10:30 A M Medical Record Number: [id number] Patient Account Number: [id number] Date of Birth/Sex: Treating RN: [birth year] (74 y.o. Marvis Repress Primary Care Stephen Turnbaugh: [name] Other Clinician: Referring Gregory Dowe: Treating Issis Lindseth/Extender: Drucilla Schmidt Weeks in Treatment: 0 Wound Status Wound Number: 4 Primary Lymphedema Etiology: Wound Location: Left, Proximal, Medial Lower Leg Wound Status: Open Wounding Event: Blister Comorbid Lymphedema, Deep Vein Thrombosis, Hypertension, Date Acquired: 01/15/2020 History: Received Radiation Weeks Of Treatment: 0 Clustered Wound: No Wound Measurements Length: (cm) 2 Width: (cm) 1.6 Depth: (cm) 0.9 Area: (cm) 2.513 Volume: (cm) 2.262 % Reduction in Area: % Reduction in Volume: Epithelialization: None Tunneling: No Undermining: No Wound  Description Classification: Full Thickness Without Exposed Support Structures Wound Margin: Well defined, not attached Exudate Amount: Large Exudate Type: Serous Exudate Color: amber Foul Odor After Cleansing: No Slough/Fibrino Yes Wound Bed Granulation Amount: Medium (34-66%) Exposed Structure Granulation Quality: Pink Fascia Exposed: No Necrotic Amount: Medium (34-66%) Fat Layer (Subcutaneous Tissue) Exposed: Yes Necrotic Quality: Adherent Slough Tendon Exposed: No Muscle Exposed: No Joint Exposed: No Bone Exposed: No Treatment Notes Wound #4 (Left, Proximal, Medial Lower Leg) 1. Cleanse With Soap and water 2. Periwound Care Moisturizing lotion 3. Primary Dressing Applied Calcium Alginate Ag 4. Secondary Dressing ABD Pad Dry Gauze 6. Support Layer Applied 4 layer compression wrap Electronic Signature(s) Signed: 02/23/2020 5:54:48 PM By: Kela Millin Entered By: Kela Millin on 02/23/2020 11:55:31 -------------------------------------------------------------------------------- Wound Assessment Details Patient Name: Date of Service: Zerbe, [name] 02/23/2020 10:30 A M Medical Record Number: [id number] Patient Account Number: [id number] Date of Birth/Sex: Treating RN: 1945/10/14 (74 y.o. Marvis Repress Primary Care Carnel Stegman: [name] Other Clinician: Referring Jennah Satchell: Treating Luv Mish/Extender: Salvadore Oxford, Benjaman Kindler  in Treatment: 0 Wound Status Wound Number: 5 Primary Lymphedema Etiology: Wound Location: Left, Distal, Medial Lower Leg Wound Status: Open Wounding Event: Blister Comorbid Lymphedema, Deep Vein Thrombosis, Hypertension, Date Acquired: 01/15/2020 History: Received Radiation Weeks Of Treatment: 0 Clustered Wound: No Wound Measurements Length: (cm) 0.5 Width: (cm) 0.5 Depth: (cm) 0.5 Area: (cm) 0.196 Volume: (cm) 0.098 % Reduction in Area: % Reduction in Volume: Epithelialization: None Tunneling:  No Undermining: No Wound Description Classification: Full Thickness Without Exposed Support Structures Wound Margin: Distinct, outline attached Exudate Amount: Small Exudate Type: Serous Exudate Color: amber Foul Odor After Cleansing: No Slough/Fibrino Yes Wound Bed Granulation Amount: Medium (34-66%) Exposed Structure Granulation Quality: Pink Fascia Exposed: No Necrotic Amount: Medium (34-66%) Fat Layer (Subcutaneous Tissue) Exposed: Yes Necrotic Quality: Adherent Slough Tendon Exposed: No Muscle Exposed: No Joint Exposed: No Bone Exposed: No Treatment Notes Wound #5 (Left, Distal, Medial Lower Leg) 1. Cleanse With Soap and water 2. Periwound Care Moisturizing lotion 3. Primary Dressing Applied Calcium Alginate Ag 4. Secondary Dressing ABD Pad Dry Gauze 6. Support Layer Applied 4 layer compression wrap Electronic Signature(s) Signed: 02/23/2020 5:54:48 PM By: Kela Millin Entered By: Kela Millin on 02/23/2020 11:56:35 -------------------------------------------------------------------------------- Vitals Details Patient Name: Date of Service: [place], [name] 02/23/2020 10:30 A M Medical Record Number: [id number] Patient Account Number: [id number] Date of Birth/Sex: Treating RN: January 05, 1946 (74 y.o. Marvis Repress Primary Care Provider: [name] Other Clinician: Referring Provider: Treating Provider/Extender: Drucilla Schmidt Weeks in Treatment: 0 Vital Signs Time Taken: 11:15 Temperature (F): 98 Pulse (bpm): 84 Respiratory Rate (breaths/min): 19 Blood Pressure (mmHg): 195/80 Reference Range: 80 - 120 mg / dl Electronic Signature(s) Signed: 02/23/2020 5:54:48 PM By: Kela Millin Entered By: Kela Millin on 02/23/2020 11:31:57

## 2020-02-24 NOTE — Progress Notes (Signed)
[name] ([id number]) Visit Report for 02/23/2020 Chief Complaint Document Details Patient Name: Date of Service: [name] 02/23/2020 10:30 A M Medical Record Number: [id number] Patient Account Number: [id number] Date of Birth/Sex: Treating RN: [birth year] (74 y.o. [name] Primary Care Provider: [name] Other Clinician: Referring Provider: Treating Provider/Extender: Drucilla Schmidt Weeks in Treatment: 0 Information Obtained from: Patient Chief Complaint 02/23/2020; patient is here for wounds on his bilateral lower legs in the setting of severe lymphedema Electronic Signature(s) Signed: 02/24/2020 8:12:45 AM By: Linton Ham MD Entered By: Linton Ham on 02/23/2020 13:06:28 -------------------------------------------------------------------------------- Debridement Details Patient Name: Date of Service: [name] 02/23/2020 10:30 A M Medical Record Number: [id number] Patient Account Number: [id number] Date of Birth/Sex: Treating RN: 1946-05-15 (74 y.o. [name], [name] Primary Care Provider: [name] Other Clinician: Referring Provider: Treating Provider/Extender: Drucilla Schmidt Weeks in Treatment: 0 Debridement Performed for Assessment: Wound #1 Right,Medial Lower Leg Performed By: Physician Ricard Dillon., MD Debridement Type: Debridement Level of Consciousness (Pre-procedure): Awake and Alert Pre-procedure Verification/Time Out Yes - 12:30 Taken: Start Time: 12:31 Pain Control: Lidocaine 4% T opical Solution T Area Debrided (L x W): otal 0.5 (cm) x 2 (cm) = 1 (cm) Tissue and other material debrided: Viable, Non-Viable, Slough, Subcutaneous, Skin: Dermis , Fibrin/Exudate, Slough Level: Skin/Subcutaneous Tissue Debridement Description: Excisional Instrument: Curette Bleeding: Minimum Hemostasis Achieved: Pressure End Time: 12:39 Procedural Pain: 0 Post Procedural Pain: 0 Response to  Treatment: Procedure was tolerated well Level of Consciousness (Post- Awake and Alert procedure): Post Debridement Measurements of Total Wound Length: (cm) 0.5 Width: (cm) 2 Depth: (cm) 0.1 Volume: (cm) 0.079 Character of Wound/Ulcer Post Debridement: Improved Post Procedure Diagnosis Same as Pre-procedure Electronic Signature(s) Signed: 02/23/2020 6:22:31 PM By: Deon Pilling Signed: 02/24/2020 8:12:45 AM By: Linton Ham MD Entered By: Linton Ham on 02/23/2020 13:03:31 -------------------------------------------------------------------------------- Debridement Details Patient Name: Date of Service: [name] 02/23/2020 10:30 A M Medical Record Number: [id number] Patient Account Number: [id number] Date of Birth/Sex: Treating RN: January 24, 1946 (74 y.o. [name], [name] Primary Care Provider: [name] Other Clinician: Referring Provider: Treating Provider/Extender: Drucilla Schmidt Weeks in Treatment: 0 Debridement Performed for Assessment: Wound #2 Left,Lateral Lower Leg Performed By: Physician Ricard Dillon., MD Debridement Type: Debridement Level of Consciousness (Pre-procedure): Awake and Alert Pre-procedure Verification/Time Out Yes - 12:30 Taken: Start Time: 12:31 Pain Control: Lidocaine 4% T opical Solution T Area Debrided (L x W): otal 2 (cm) x 2.2 (cm) = 4.4 (cm) Tissue and other material debrided: Viable, Non-Viable, Slough, Subcutaneous, Skin: Dermis , Fibrin/Exudate, Slough Level: Skin/Subcutaneous Tissue Debridement Description: Excisional Instrument: Curette Bleeding: Minimum Hemostasis Achieved: Pressure End Time: 12:39 Procedural Pain: 0 Post Procedural Pain: 0 Response to Treatment: Procedure was tolerated well Level of Consciousness (Post- Awake and Alert procedure): Post Debridement Measurements of Total Wound Length: (cm) 2 Width: (cm) 2.2 Depth: (cm) 1.5 Volume: (cm) 5.184 Character of Wound/Ulcer Post  Debridement: Improved Post Procedure Diagnosis Same as Pre-procedure Electronic Signature(s) Signed: 02/23/2020 6:22:31 PM By: Deon Pilling Signed: 02/24/2020 8:12:45 AM By: Linton Ham MD Entered By: Linton Ham on 02/23/2020 13:03:57 -------------------------------------------------------------------------------- Debridement Details Patient Name: Date of Service: [name] 02/23/2020 10:30 A M Medical Record Number: [id number] Patient Account Number: [id number] Date of Birth/Sex: Treating RN: 01/05/1946 (74 y.o. [name] Primary Care Provider: [name] Other Clinician: Referring Provider: Treating Provider/Extender: Drucilla Schmidt Weeks in Treatment: 0 Debridement Performed  for Assessment: Wound #3 Left,Anterior Lower Leg Performed By: Physician Ricard Dillon., MD Debridement Type: Debridement Level of Consciousness (Pre-procedure): Awake and Alert Pre-procedure Verification/Time Out Yes - 12:30 Taken: Start Time: 12:31 Pain Control: Lidocaine 4% T opical Solution T Area Debrided (L x W): otal 0.9 (cm) x 1.9 (cm) = 1.71 (cm) Tissue and other material debrided: Viable, Non-Viable, Slough, Subcutaneous, Skin: Dermis , Fibrin/Exudate, Slough Level: Skin/Subcutaneous Tissue Debridement Description: Excisional Instrument: Curette Bleeding: Minimum Hemostasis Achieved: Pressure End Time: 12:39 Procedural Pain: 0 Post Procedural Pain: 0 Response to Treatment: Procedure was tolerated well Level of Consciousness (Post- Awake and Alert procedure): Post Debridement Measurements of Total Wound Length: (cm) 0.9 Width: (cm) 1.9 Depth: (cm) 1.2 Volume: (cm) 1.612 Character of Wound/Ulcer Post Debridement: Improved Post Procedure Diagnosis Same as Pre-procedure Electronic Signature(s) Signed: 02/23/2020 6:22:31 PM By: Deon Pilling Signed: 02/24/2020 8:12:45 AM By: Linton Ham MD Entered By: Linton Ham on 02/23/2020  13:04:09 -------------------------------------------------------------------------------- Debridement Details Patient Name: Date of Service: [name] 02/23/2020 10:30 A M Medical Record Number: [id number] Patient Account Number: [id number] Date of Birth/Sex: Treating RN: [birth year] (74 y.o. [name], [name] Primary Care Provider: [name] Other Clinician: Referring Provider: Treating Provider/Extender: Drucilla Schmidt Weeks in Treatment: 0 Debridement Performed for Assessment: Wound #4 Left,Proximal,Medial Lower Leg Performed By: Physician Ricard Dillon., MD Debridement Type: Debridement Level of Consciousness (Pre-procedure): Awake and Alert Pre-procedure Verification/Time Out Yes - 12:30 Taken: Start Time: 12:31 Pain Control: Lidocaine 4% T opical Solution T Area Debrided (L x W): otal 2 (cm) x 1.6 (cm) = 3.2 (cm) Tissue and other material debrided: Viable, Non-Viable, Slough, Subcutaneous, Skin: Dermis , Fibrin/Exudate, Slough Level: Skin/Subcutaneous Tissue Debridement Description: Excisional Instrument: Curette Bleeding: Minimum Hemostasis Achieved: Pressure End Time: 12:39 Procedural Pain: 0 Post Procedural Pain: 0 Response to Treatment: Procedure was tolerated well Level of Consciousness (Post- Level of Consciousness (Post- Awake and Alert procedure): Post Debridement Measurements of Total Wound Length: (cm) 2 Width: (cm) 1.6 Depth: (cm) 0.9 Volume: (cm) 2.262 Character of Wound/Ulcer Post Debridement: Improved Post Procedure Diagnosis Same as Pre-procedure Electronic Signature(s) Signed: 02/23/2020 6:22:31 PM By: Deon Pilling Signed: 02/24/2020 8:12:45 AM By: Linton Ham MD Entered By: Linton Ham on 02/23/2020 13:04:25 -------------------------------------------------------------------------------- HPI Details Patient Name: Date of Service: [place]ang[place], [name] 02/23/2020 10:30 A M Medical Record Number:  [id number] Patient Account Number: [id number] Date of Birth/Sex: Treating RN: Dec 14, 1945 (74 y.o. [name] Primary Care Provider: [name] Other Clinician: Referring Provider: Treating Provider/Extender: Drucilla Schmidt Weeks in Treatment: 0 History of Present Illness HPI Description: ADMISSION 02/23/2020 Patient is a 74 year old man who lives in [place] who arrives accompanied by his wife. He has a history of chronic lymphedema and venous insufficiency in his bilateral lower legs which may have something to do that with having a history of DVT as well as being treated for prostate cancer. In any case he recently got compression pumps at home but compliance has been an issue here. He has compression stockings however they are probably not sufficient enough to control swelling. They tell us that things deteriorated for him in late August he was admitted to [facility] for 7 days. This was with cellulitis I think of his bilateral lower legs. Discharge he was noted to have wounds on his bilateral lower legs. He was discharged on Bactrim. They tried to get him home health through [facility] of course they declined him. His  wife is been wrapping these applying some form of silver foam dressing. He has a history of wounds before although nothing that would not heal with basic home topical dressings. He has 2 areas on the left medial, left anterior and left lateral and a smaller area on the right medial. All of these have considerable depth. Past medical history includes iron deficiency anemia, lymphedema followed by the rehab center at [facility] with lymphedema wraps I believe, DVT on chronic anticoagulation, prostate cancer, chronic venous insufficiency, hypertension. As mentioned he has compression pumps but does not use them. ABIs in our clinic were noncompressible bilaterally Electronic Signature(s) Signed: 02/24/2020 8:12:45 AM By: Linton Ham MD Entered By: Linton Ham on 02/23/2020 13:09:24 -------------------------------------------------------------------------------- Physical Exam Details Patient Name: Date of Service: [place], [name] 02/23/2020 10:30 A M Medical Record Number: [id number] Patient Account Number: [id number] Date of Birth/Sex: Treating RN: 1946/01/12 (74 y.o. [name] Primary Care Provider: [name] Other Clinician: Referring Provider: Treating Provider/Extender: Drucilla Schmidt Weeks in Treatment: 0 Constitutional Patient is hypertensive.. Pulse regular and within target range for patient.Marland Kitchen Respirations regular, non-labored and within target range.. Temperature is normal and within the target range for the patient.Marland Kitchen Appears in no distress. Respiratory work of breathing is normal. Bilateral breath sounds are clear and equal in all lobes with no wheezes, rales or rhonchi.. Cardiovascular No convincing evidence of CHF. Dorsalis pedis pulses were palpable bilaterally his feet are warm. Severe lymphedema right greater than left leg skin changes of chronic lymphedema with thickened fissured hyperkeratotic skin right greater than left.. Integumentary (Hair, Skin) Severe chronic lymphedema with skin changes related to this right greater than left. Notes Wound exam; paradoxically the worst areas here were on the left. These are deep punched out wounds with necrotic material on the top of them I remove this gently with a #5 curette. Some of these have considerable depth. On the right sided medially he has a superficial linear wound this was also debrided. The only wound that was not debrided was the distal left medial in fact I am not even sure this is open. I did not see any evidence of active infection Electronic Signature(s) Signed: 02/24/2020 8:12:45 AM By: Linton Ham MD Entered By: Linton Ham on 02/23/2020  13:14:27 -------------------------------------------------------------------------------- Physician Orders Details Patient Name: Date of Service: V[place], [name] 02/23/2020 10:30 A M Medical Record Number: [id number] Patient Account Number: [id number] Date of Birth/Sex: Treating RN: 1946/04/21 (74 y.o. [name] Primary Care Provider: [name] Other Clinician: Referring Provider: Treating Provider/Extender: Drucilla Schmidt Weeks in Treatment: 0 Verbal / Phone Orders: No Diagnosis Coding ICD-10 Coding Code Description I89.0 Lymphedema, not elsewhere classified Follow-up Appointments ppointment in 1 week. - thursday Return A Dressing Change Frequency Other: - dressing changes twice a week. home health once and wound center once a week. Skin Barriers/Peri-Wound Care TCA Cream or Ointment - mixed with lotion. Wound Cleansing May shower with protection. - use a cast protector, Primary Wound Dressing Wound #1 Right,Medial Lower Leg Calcium Alginate with Silver Wound #2 Left,Lateral Lower Leg Calcium Alginate with Silver Wound #3 Left,Anterior Lower Leg Calcium Alginate with Silver Wound #4 Left,Proximal,Medial Lower Leg Calcium Alginate with Silver Wound #5 Left,Distal,Medial Lower Leg Calcium Alginate with Silver Secondary Dressing Dry Gauze ABD pad Edema Control 4 layer compression - Bilateral - first layer of unna boot to upper portion of lower leg to aid in securing the compression wraps. Avoid standing for long  periods of time Elevate legs to the level of the heart or above for 30 minutes daily and/or when sitting, a frequency of: - 3-4 times a throughout the day. Exercise regularly Segmental Compressive Device. - Patient to use lymphedema pumps twice a day for an hour each time daily. Home Health dmit to [facility] - Dressing changes twice a week. home health once and wound center once a week. A Electronic  Signature(s) Signed: 02/23/2020 6:22:31 PM By: Deon Pilling Signed: 02/24/2020 8:12:45 AM By: Linton Ham MD Entered By: Deon Pilling on 02/23/2020 12:45:40 -------------------------------------------------------------------------------- Problem List Details Patient Name: Date of Service: [place], [name] 02/23/2020 10:30 A M Medical Record Number: [id number] Patient Account Number: [id number] Date of Birth/Sex: Treating RN: 1946/04/24 (74 y.o. 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 [name] Primary Care Provider: Other Clinician: [name] Referring Provider: Treating Provider/Extender: Salvadore Oxford, [name] Weeks in Treatment: 0 Subjective Chief Complaint Information obtained from Patient 02/23/2020; patient is here for wounds on his bilateral lower legs in the setting of severe lymphedema History of Present Illness (HPI) ADMISSION 02/23/2020 Patient is a 74 year old man who lives in [place] who arrives  accompanied by his wife. He has a history of chronic lymphedema and venous insufficiency in his bilateral lower legs which may have something to do that with having a history of DVT as well as being treated for prostate cancer. In any case he recently got compression pumps at home but compliance has been an issue here. He has compression stockings however they are probably not sufficient enough to control swelling. They tell us that things deteriorated for him in late August he was admitted to [facility] for 7 days. This was with cellulitis I think of his bilateral lower legs. Discharge he was noted to have wounds on his bilateral lower legs. He was discharged on Bactrim. They tried to get him home health through [facility] of course they declined him. His wife is been wrapping these applying some form of silver foam dressing. He has a history of wounds before although nothing that would not heal with basic home topical dressings. He has 2 areas on the left medial, left anterior and left lateral and a smaller area on the right medial. All of these have considerable depth. Past medical history includes iron deficiency anemia, lymphedema followed by the rehab center at [facility] with lymphedema wraps I believe, DVT on chronic anticoagulation, prostate cancer, chronic venous insufficiency, hypertension. As mentioned he has compression pumps but does not use them. ABIs in our clinic were noncompressible bilaterally Patient History Allergies cephalexin (Severity: Moderate), mometasone furoate (Severity: Moderate) Family History Diabetes - Child, No family history of Cancer, Heart Disease, Hereditary Spherocytosis, Hypertension, Kidney  Disease, Lung Disease, Seizures, Stroke, Thyroid Problems, Tuberculosis. Social History Never smoker, Marital Status - Married, Alcohol Use - Rarely, Drug Use - No History, Caffeine Use - Daily - coffee. Medical History Eyes Denies history of  Cataracts, Glaucoma, Optic Neuritis Hematologic/Lymphatic Patient has history of Lymphedema Cardiovascular Patient has history of Deep Vein Thrombosis, Hypertension Oncologic Patient has history of Received Radiation Medical A Surgical History Notes nd Cardiovascular varicose veins Oncologic prostate cancer Review of Systems (ROS) Constitutional Symptoms (General Health) Denies complaints or symptoms of Fatigue, Fever, Chills, Marked Weight Change. Eyes Denies complaints or symptoms of Dry Eyes, Vision Changes, Glasses / Contacts. Ear/Nose/Mouth/Throat Denies complaints or symptoms of Chronic sinus problems or rhinitis. Respiratory Denies complaints or symptoms of Chronic or frequent coughs, Shortness of Breath. Gastrointestinal Denies complaints or symptoms of Frequent diarrhea, Nausea, Vomiting. Endocrine Denies complaints or symptoms of Heat/cold intolerance. Genitourinary Denies complaints or symptoms of Frequent urination. Integumentary (Skin) Complains or has symptoms of Wounds - both legs. Musculoskeletal Denies complaints or symptoms of Muscle Pain, Muscle Weakness. Neurologic Denies complaints or symptoms of Numbness/parasthesias. Psychiatric Denies complaints or symptoms of Claustrophobia, Suicidal. Objective Constitutional Patient is hypertensive.. Pulse regular and within target range for patient.Marland Kitchen Respirations regular, non-labored and within target range.. Temperature is normal and within the target range for the patient.Marland Kitchen Appears in no distress. Vitals Time Taken: 11:15 AM, Temperature: 98 F, Pulse: 84 bpm, Respiratory Rate: 19 breaths/min, Blood Pressure: 195/80 mmHg. Cardiovascular Dorsalis pedis pulses were palpable bilaterally his feet are warm. Severe lymphedema right greater than left leg skin changes of chronic lymphedema with thickened fissured hyperkeratotic skin right greater than left.. General Notes: Wound exam; paradoxically the worst areas  here were on the left. These are deep punched out wounds with necrotic material on the top of them I remove this gently with a #5 curette. Some of these have considerable depth. On the right sided medially he has a superficial linear wound this was also debrided. The only wound that was not debrided was the distal left medial in fact I am not even sure this is open. I did not see any evidence of active infection Integumentary (Hair, Skin) Wound #1 status is Open. Original cause of wound was Blister. The wound is located on the Right,Medial Lower Leg. The wound measures 0.5cm length x 2cm width x 0.1cm depth; 0.785cm^2 area and 0.079cm^3 volume. There is Fat Layer (Subcutaneous Tissue) exposed. There is no tunneling or undermining noted. There is a medium amount of serous drainage noted. The wound margin is distinct with the outline attached to the wound base. There is small (1-33%) pink granulation within the wound bed. There is a large (67-100%) amount of necrotic tissue within the wound bed including Adherent Slough. Wound #2 status is Open. Original cause of wound was Blister. The wound is located on the Left,Lateral Lower Leg. The wound measures 2cm length x 2.2cm width x 1.5cm depth; 3.456cm^2 area and 5.184cm^3 volume. There is Fat Layer (Subcutaneous Tissue) exposed. There is no tunneling or undermining noted. There is a large amount of serous drainage noted. The wound margin is well defined and not attached to the wound base. There is medium (34-66%) pink granulation within the wound bed. There is a medium (34-66%) amount of necrotic tissue within the wound bed including Adherent Slough. Wound #3 status is Open. Original cause of wound was Blister. The wound is located on the Left,Anterior Lower Leg. The wound measures 0.9cm length x 1.9cm width x 1.2cm depth; 1.343cm^2 area  and 1.612cm^3 volume. There is Fat Layer (Subcutaneous Tissue) exposed. There is no tunneling or undermining noted. There  is a large amount of serous drainage noted. The wound margin is well defined and not attached to the wound base. There is small (1-33%) pink granulation within the wound bed. There is a large (67-100%) amount of necrotic tissue within the wound bed including Adherent Slough. Wound #4 status is Open. Original cause of wound was Blister. The wound is located on the Left,Proximal,Medial Lower Leg. The wound measures 2cm length x 1.6cm width x 0.9cm depth; 2.513cm^2 area and 2.262cm^3 volume. There is Fat Layer (Subcutaneous Tissue) exposed. There is no tunneling or undermining noted. There is a large amount of serous drainage noted. The wound margin is well defined and not attached to the wound base. There is medium (34-66%) pink granulation within the wound bed. There is a medium (34-66%) amount of necrotic tissue within the wound bed including Adherent Slough. Wound #5 status is Open. Original cause of wound was Blister. The wound is located on the Left,Distal,Medial Lower Leg. The wound measures 0.5cm length x 0.5cm width x 0.5cm depth; 0.196cm^2 area and 0.098cm^3 volume. There is Fat Layer (Subcutaneous Tissue) exposed. There is no tunneling or undermining noted. There is a small amount of serous drainage noted. The wound margin is distinct with the outline attached to the wound base. There is medium (34-66%) pink granulation within the wound bed. There is a medium (34-66%) amount of necrotic tissue within the wound bed including Adherent Slough. Assessment Active Problems ICD-10 Lymphedema, not elsewhere classified Non-pressure chronic ulcer of other part of left lower leg with fat layer exposed Non-pressure chronic ulcer of other part of right lower leg with fat layer exposed Procedures Wound #1 Pre-procedure diagnosis of Wound #1 is a Lymphedema located on the Right,Medial Lower Leg . There was a Excisional Skin/Subcutaneous Tissue Debridement with a total area of 1 sq cm performed by Ricard Dillon., MD. With the following instrument(s): Curette to remove Viable and Non-Viable tissue/material. Material removed includes Subcutaneous Tissue, Slough, Skin: Dermis, and Fibrin/Exudate after achieving pain control using Lidocaine 4% T opical Solution. A time out was conducted at 12:30, prior to the start of the procedure. A Minimum amount of bleeding was controlled with Pressure. The procedure was tolerated well with a pain level of 0 throughout and a pain level of 0 following the procedure. Post Debridement Measurements: 0.5cm length x 2cm width x 0.1cm depth; 0.079cm^3 volume. Character of Wound/Ulcer Post Debridement is improved. Post procedure Diagnosis Wound #1: Same as Pre-Procedure Pre-procedure diagnosis of Wound #1 is a Lymphedema located on the Right,Medial Lower Leg . There was a Four Layer Compression Therapy Procedure by Levan Hurst, RN. Post procedure Diagnosis Wound #1: Same as Pre-Procedure Wound #2 Pre-procedure diagnosis of Wound #2 is a Lymphedema located on the Left,Lateral Lower Leg . There was a Excisional Skin/Subcutaneous Tissue Debridement with a total area of 4.4 sq cm performed by Ricard Dillon., MD. With the following instrument(s): Curette to remove Viable and Non-Viable tissue/material. Material removed includes Subcutaneous Tissue, Slough, Skin: Dermis, and Fibrin/Exudate after achieving pain control using Lidocaine 4% T opical Solution. A time out was conducted at 12:30, prior to the start of the procedure. A Minimum amount of bleeding was controlled with Pressure. The procedure was tolerated well with a pain level of 0 throughout and a pain level of 0 following the procedure. Post Debridement Measurements: 2cm length x 2.2cm width x 1.5cm depth;  5.184cm^3 volume. Character of Wound/Ulcer Post Debridement is improved. Post procedure Diagnosis Wound #2: Same as Pre-Procedure Pre-procedure diagnosis of Wound #2 is a Lymphedema located on the  Left,Lateral Lower Leg . There was a Four Layer Compression Therapy Procedure by Levan Hurst, RN. Post procedure Diagnosis Wound #2: Same as Pre-Procedure Wound #3 Pre-procedure diagnosis of Wound #3 is a Lymphedema located on the Left,Anterior Lower Leg . There was a Excisional Skin/Subcutaneous Tissue Debridement with a total area of 1.71 sq cm performed by Ricard Dillon., MD. With the following instrument(s): Curette to remove Viable and Non-Viable tissue/material. Material removed includes Subcutaneous Tissue, Slough, Skin: Dermis, and Fibrin/Exudate after achieving pain control using Lidocaine 4% T opical Solution. A time out was conducted at 12:30, prior to the start of the procedure. A Minimum amount of bleeding was controlled with Pressure. The procedure was tolerated well with a pain level of 0 throughout and a pain level of 0 following the procedure. Post Debridement Measurements: 0.9cm length x 1.9cm width x 1.2cm depth; 1.612cm^3 volume. Character of Wound/Ulcer Post Debridement is improved. Post procedure Diagnosis Wound #3: Same as Pre-Procedure Pre-procedure diagnosis of Wound #3 is a Lymphedema located on the Left,Anterior Lower Leg . There was a Four Layer Compression Therapy Procedure by Levan Hurst, RN. Post procedure Diagnosis Wound #3: Same as Pre-Procedure Wound #4 Pre-procedure diagnosis of Wound #4 is a Lymphedema located on the Left,Proximal,Medial Lower Leg . There was a Excisional Skin/Subcutaneous Tissue Debridement with a total area of 3.2 sq cm performed by Ricard Dillon., MD. With the following instrument(s): Curette to remove Viable and Non-Viable tissue/material. Material removed includes Subcutaneous Tissue, Slough, Skin: Dermis, and Fibrin/Exudate after achieving pain control using Lidocaine 4% T opical Solution. A time out was conducted at 12:30, prior to the start of the procedure. A Minimum amount of bleeding was controlled with Pressure.  The procedure was tolerated well with a pain level of 0 throughout and a pain level of 0 following the procedure. Post Debridement Measurements: 2cm length x 1.6cm width x 0.9cm depth; 2.262cm^3 volume. Character of Wound/Ulcer Post Debridement is improved. Post procedure Diagnosis Wound #4: Same as Pre-Procedure Pre-procedure diagnosis of Wound #4 is a Lymphedema located on the Left,Proximal,Medial Lower Leg . There was a Four Layer Compression Therapy Procedure by Levan Hurst, RN. Post procedure Diagnosis Wound #4: Same as Pre-Procedure Wound #5 Pre-procedure diagnosis of Wound #5 is a Lymphedema located on the Left,Distal,Medial Lower Leg . There was a Four Layer Compression Therapy Procedure by Levan Hurst, RN. Post procedure Diagnosis Wound #5: Same as Pre-Procedure Plan Follow-up Appointments: Return Appointment in 1 week. - thursday Dressing Change Frequency: Other: - dressing changes twice a week. home health once and wound center once a week. Skin Barriers/Peri-Wound Care: TCA Cream or Ointment - mixed with lotion. Wound Cleansing: May shower with protection. - use a cast protector, Primary Wound Dressing: Wound #1 Right,Medial Lower Leg: Calcium Alginate with Silver Wound #2 Left,Lateral Lower Leg: Calcium Alginate with Silver Wound #3 Left,Anterior Lower Leg: Calcium Alginate with Silver Wound #4 Left,Proximal,Medial Lower Leg: Calcium Alginate with Silver Wound #5 Left,Distal,Medial Lower Leg: Calcium Alginate with Silver Secondary Dressing: Dry Gauze ABD pad Edema Control: 4 layer compression - Bilateral - first layer of unna boot to upper portion of lower leg to aid in securing the compression wraps. Avoid standing for long periods of time Elevate legs to the level of the heart or above for 30 minutes daily and/or when sitting, a frequency  of: - 3-4 times a throughout the day. Exercise regularly Segmental Compressive Device. - Patient to use lymphedema  pumps twice a day for an hour each time daily. Home Health: Admit to [facility] for [facility] changes twice a week. home health once and wound center once a week. 1. Wounds on the left greater than right lower extremity in the setting of severe chronic lymphedema. The deterioration on August secondary to infection may have caused this. He is noncompliant with stockings and external compression pump usage. 2. I found no evidence of congestive heart failure. He is followed by cardiology. He has torsemide that he is supposed to be using I think on a as needed basis. I told him it would be reasonable to use this to see if we can bring down the swelling 3. We will use silver alginate secondary absorptive dressings ABD pads under 4-layer compression bilaterally. I told him to use his compression pumps twice a day over the top of this. When he sitting he will need to elevate his legs. Otherwise he will have no chance of healing these and perhaps further deterioration. 4. Although his ABIs in our clinic were noncompressible. I felt his peripheral pulses dorsalis pedis pulses were vibrant his feet are warm I do not think there is a primary arterial issue here I spent 35 minutes in review of this patient's past medical history, face-to-face evaluation and preparation of this record Electronic Signature(s) Signed: 02/24/2020 8:12:45 AM By: Linton Ham MD Entered By: Linton Ham on 02/23/2020 13:13:51 -------------------------------------------------------------------------------- HxROS Details Patient Name: Date of Service: Darr, [name] 02/23/2020 10:30 A M Medical Record Number: [id number] Patient Account Number: [id number] Date of Birth/Sex: Treating RN: [birth year] (74 y.o. Marvis Repress Primary Care Provider: [name] Other Clinician: Referring Provider: Treating Provider/Extender: Drucilla Schmidt Weeks in Treatment: 0 Constitutional  Symptoms (General Health) Complaints and Symptoms: Negative for: Fatigue; Fever; Chills; Marked Weight Change Eyes Complaints and Symptoms: Negative for: Dry Eyes; Vision Changes; Glasses / Contacts Medical History: Negative for: Cataracts; Glaucoma; Optic Neuritis Ear/Nose/Mouth/Throat Complaints and Symptoms: Negative for: Chronic sinus problems or rhinitis Respiratory Complaints and Symptoms: Negative for: Chronic or frequent coughs; Shortness of Breath Gastrointestinal Complaints and Symptoms: Negative for: Frequent diarrhea; Nausea; Vomiting Endocrine Complaints and Symptoms: Negative for: Heat/cold intolerance Genitourinary Complaints and Symptoms: Negative for: Frequent urination Integumentary (Skin) Complaints and Symptoms: Positive for: Wounds - both legs Musculoskeletal Complaints and Symptoms: Negative for: Muscle Pain; Muscle Weakness Neurologic Complaints and Symptoms: Negative for: Numbness/parasthesias Psychiatric Complaints and Symptoms: Negative for: Claustrophobia; Suicidal Hematologic/Lymphatic Medical History: Positive for: Lymphedema Cardiovascular Medical History: Positive for: Deep Vein Thrombosis; Hypertension Past Medical History Notes: varicose veins Immunological Oncologic Medical History: Positive for: Received Radiation Past Medical History Notes: prostate cancer Immunizations Pneumococcal Vaccine: Received Pneumococcal Vaccination: Yes Implantable Devices No devices added Family and Social History Cancer: No; 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; Heart Disease: No; Hereditary Spherocytosis: No; Hypertension: No; Kidney Disease: No; Lung Disease: No; Seizures: No; Stroke: No; Thyroid Problems: No; Tuberculosis: No; Never smoker; Marital Status - Married; Alcohol Use: Rarely; Drug Use: No History; Caffeine Use: Daily - coffee; Financial Concerns: No; Food, Clothing or Shelter Needs: No; Support System Lacking: No; Transportation Concerns:  No Electronic Signature(s) Signed: 02/23/2020 5:54:48 PM By: Kela Millin Signed: 02/24/2020 8:12:45 AM By: Linton Ham MD Entered By: Kela Millin on 02/23/2020 11:37:46 -------------------------------------------------------------------------------- SuperBill Details Patient Name: Date of Service: Dower, [name] 02/23/2020 Medical Record Number: [id number] Patient Account  Number: [id number] Date of Birth/Sex: Treating RN: 1945/11/25 (74 y.o. [name], [name] Primary Care Provider: [name] Other Clinician: Referring Provider: Treating Provider/Extender: Drucilla Schmidt Weeks in Treatment: 0 Diagnosis Coding ICD-10 Codes Code Description I89.0 Lymphedema, not elsewhere classified L97.822 Non-pressure chronic ulcer of other part of left lower leg with fat layer exposed L97.812 Non-pressure chronic ulcer of other part of right lower leg with fat layer exposed Facility Procedures CPT4 Code: [id number] Description: [place] VISIT-LEV 5 EST PT Modifier: Quantity: 1 CPT4 Code: [id number] Description: 50932 - DEB SUBQ TISSUE 20 SQ CM/< ICD-10 Diagnosis Description I89.0 Lymphedema, not elsewhere classified L97.822 Non-pressure chronic ulcer of other part of left lower leg with fat layer expos L97.812 Non-pressure chronic ulcer of other  part of right lower leg with fat layer expo Modifier: ed sed Quantity: 1 Physician Procedures : CPT4 Code Description Modifier [id number] [place] PHYS LEVEL 3 NEW PT 25 ICD-10 Diagnosis Description I89.0 Lymphedema, not elsewhere classified L97.822 Non-pressure chronic ulcer of other part of left lower leg with fat layer exposed L97.812 Non-pressure  chronic ulcer of other part of right lower leg with fat layer exposed Quantity: 1 : [id number] 11042 - WC PHYS SUBQ TISS 20 SQ CM ICD-10 Diagnosis Description I89.0 Lymphedema, not elsewhere classified L97.822 Non-pressure chronic ulcer of other part of left lower leg with  fat layer exposed L97.812 Non-pressure chronic ulcer of other  part of right lower leg with fat layer exposed Quantity: 1 Electronic Signature(s) Signed: 02/24/2020 8:12:45 AM By: Linton Ham MD Entered By: Linton Ham on 02/23/2020 13:15:08

## 2020-03-01 ENCOUNTER — Encounter (HOSPITAL_BASED_OUTPATIENT_CLINIC_OR_DEPARTMENT_OTHER): Payer: Medicare Other | Admitting: Internal Medicine

## 2020-03-01 ENCOUNTER — Other Ambulatory Visit: Payer: Self-pay

## 2020-03-01 DIAGNOSIS — L97822 Non-pressure chronic ulcer of other part of left lower leg with fat layer exposed: Secondary | ICD-10-CM | POA: Diagnosis not present

## 2020-03-01 NOTE — Progress Notes (Signed)
[name] ([id number]) Visit Report for 03/01/2020 Arrival Information Details Patient Name: Date of Service: [name] 03/01/2020 12:45 PM Medical Record Number: [id number] Patient Account Number: [id number] Date of Birth/Sex: Treating RN: 15-Apr-1946 (74 y.o. Jerilynn Mages) Carlene Coria Primary Care Jerlene Rockers: [name] Other Clinician: Referring Allante Beane: Treating Jaquise Faux/Extender: Drucilla Schmidt Weeks in Treatment: 1 Visit Information History Since Last Visit All ordered tests and consults were completed: No Patient Arrived: Ambulatory Added or deleted any medications: No Arrival Time: 12:58 Any new allergies or adverse reactions: No Accompanied By: wife Had a fall or experienced change in No Transfer Assistance: None activities of daily living that may affect Patient Identification Verified: Yes risk of falls: Secondary Verification Process Completed: Yes Signs or symptoms of abuse/neglect since last visito No Patient Has Alerts: Yes Hospitalized since last visit: No Patient Alerts: Patient on Blood Thinner Implantable device outside of the clinic excluding No on warfarin cellular tissue based products placed in the center Bilateral ABI: [place] since last visit: Has Dressing in Place as Prescribed: Yes Pain Present Now: No Electronic Signature(s) Signed: 03/01/2020 5:51:40 PM By: Carlene Coria RN Entered By: Carlene Coria on 03/01/2020 12:59:23 -------------------------------------------------------------------------------- Compression Therapy Details Patient Name: Date of Service: [name] 03/01/2020 12:45 PM Medical Record Number: [id number] Patient Account Number: [id number] Date of Birth/Sex: Treating RN: January 18, 1946 (74 y.o.o. [name] Primary Care Carless Slatten: [name] Other Clinician: Referring Jessey Huyett: Treating Numan Zylstra/Extender: Drucilla Schmidt Weeks in Treatment: 1 Compression Therapy Performed for  Wound Assessment: Wound #1 Right,Medial Lower Leg Performed By: Clinician Baruch Gouty, RN Compression Type: Four Layer Post Procedure Diagnosis Same as Pre-procedure Electronic Signature(s) Signed: 03/01/2020 5:51:00 PM By: Deon Pilling Entered By: Deon Pilling on 03/01/2020 13:34:24 -------------------------------------------------------------------------------- Compression Therapy Details Patient Name: Date of Service: [name] 03/01/2020 12:45 PM Medical Record Number: [id number] Patient Account Number: [id number] Date of Birth/Sex: Treating RN: 07-22-1945 (74 y.o. [name] Primary Care Oswald Pott: [name] Other Clinician: Referring Billyjack Trompeter: Treating Penda Venturi/Extender: Drucilla Schmidt Weeks in Treatment: 1 Compression Therapy Performed for Wound Assessment: Wound #3 Left,Anterior Lower Leg Performed By: Clinician Baruch Gouty, RN Compression Type: Four Layer Post Procedure Diagnosis Same as Pre-procedure Electronic Signature(s) Signed: 03/01/2020 5:51:00 PM By: Deon Pilling Entered By: Deon Pilling on 03/01/2020 13:34:24 -------------------------------------------------------------------------------- Compression Therapy Details Patient Name: Date of Service: [name] 03/01/2020 12:45 PM Medical Record Number: [id number] Patient Account Number: [id number] Date of Birth/Sex: Treating RN: 1945-08-04 (74 y.o. [name] Primary Care Chaunce Winkels: [name] Other Clinician: Referring Val Farnam: Treating Krystal Teachey/Extender: Drucilla Schmidt Weeks in Treatment: 1 Compression Therapy Performed for Wound Assessment: Wound #2 Left,Lateral Lower Leg Performed By: Clinician Baruch Gouty, RN Compression Type: Four Layer Post Procedure Diagnosis Same as Pre-procedure Electronic Signature(s) Signed: 03/01/2020 5:51:00 PM By: Deon Pilling Entered By: Deon Pilling on 03/01/2020  13:34:24 -------------------------------------------------------------------------------- Compression Therapy Details Patient Name: Date of Service: [name] 03/01/2020 12:45 PM Medical Record Number: [id number] Patient Account Number: [id number] Date of Birth/Sex: Treating RN: 03/03/1946 (74 y.o. [name] Primary Care Terissa Haffey: [name] Other Clinician: Referring Pegah Segel: Treating Alvar Malinoski/Extender: Drucilla Schmidt Weeks in Treatment: 1 Compression Therapy Performed for Wound Assessment: Wound #4 Left,Proximal,Medial Lower Leg Performed By: Clinician Baruch Gouty, RN Compression Type: Four Layer Post Procedure Diagnosis Same as Pre-procedure Electronic Signature(s) Signed: 03/01/2020 5:51:00 PM By: Deon Pilling Entered By: Deon Pilling on 03/01/2020 13:34:25 -------------------------------------------------------------------------------- Encounter Discharge  Information Details Patient Name: Date of Service: [name] 03/01/2020 12:45 PM Medical Record Number: [id number] Patient Account Number: [id number] Date of Birth/Sex: Treating RN: 18-Sep-1945 (74 y.o. [name] Primary Care Cailan General: [name] Other Clinician: Referring Rita Vialpando: Treating Geri Hepler/Extender: Alycia Rossetti in Treatment: 1 Encounter Discharge Information Items Discharge Condition: Stable Ambulatory Status: Ambulatory Discharge Destination: Home Transportation: Private Auto Accompanied By: wife Schedule Follow-up Appointment: Yes Clinical Summary of Care: Electronic Signature(s) Signed: 03/01/2020 5:51:00 PM By: Deon Pilling Entered By: Deon Pilling on 03/01/2020 14:06:24 -------------------------------------------------------------------------------- Lower Extremity Assessment Details Patient Name: Date of Service: [name] 03/01/2020 12:45 PM Medical Record Number: [id number] Patient Account  Number: [id number] Date of Birth/Sex: Treating RN: [birth year] (74 y.o. Oval Linsey Primary Care Jeannie Mallinger: [name] Other Clinician: Referring Deedee Lybarger: Treating Kanai Berrios/Extender: Drucilla Schmidt Weeks in Treatment: 1 Edema Assessment Assessed: [Left: No] [Right: No] E[Left: dema] [Right: :] Calf Left: Right: Point of Measurement: 46 cm From Medial Instep 43 cm 49 cm Ankle Left: Right: Point of Measurement: 15 cm From Medial Instep 32 cm 33 cm Electronic Signature(s) Signed: 03/01/2020 5:51:40 PM By: Carlene Coria RN Entered By: Carlene Coria on 03/01/2020 13:19:50 -------------------------------------------------------------------------------- Multi Wound Chart Details Patient Name: Date of Service: Narayan, DA [name] 03/01/2020 12:45 PM Medical Record Number: [id number] Patient Account Number: [id number] Date of Birth/Sex: Treating RN: Nov 08, 1945 (74 y.o. [name] Primary Care Monik Lins: [name] Other Clinician: Referring Gergory Biello: Treating [name] Schillaci/Extender: Drucilla Schmidt Weeks in Treatment: 1 Vital Signs Height(in): Pulse(bpm): 43 Weight(lbs): Blood Pressure(mmHg): 190/74 Body Mass Index(BMI): Temperature(F): 97.9 Respiratory Rate(breaths/min): 18 Photos: [1:No Photos Right, Medial Lower Leg] [2:No Photos Left, Lateral Lower Leg] [3:No Photos Left, Anterior Lower Leg] Wound Location: [1:Blister] [2:Blister] [3:Blister] Wounding Event: [1:Lymphedema] [2:Lymphedema] [3:Lymphedema] Primary Etiology: [1:Lymphedema, Deep Vein Thrombosis, Lymphedema, Deep Vein Thrombosis, Lymphedema, Deep Vein Thrombosis,] Comorbid History: [1:Hypertension, Received Radiation 01/15/2020] [2:Hypertension, Received Radiation 01/15/2020] [3:Hypertension, Received Radiation 01/15/2020] Date Acquired: [1:1] [2:1] [3:1] Weeks of Treatment: [1:Open] [2:Open] [3:Open] Wound Status: [1:0.7x1.7x0.1] [2:2.2x1.8x1.5]  [3:0.9x1.1x0.8] Measurements L x W x D (cm) [1:0.935] [2:3.11] [3:0.778] A (cm) : rea [1:0.093] [2:4.665] [3:0.622] Volume (cm) : [1:-19.10%] [2:10.00%] [3:42.10%] % Reduction in Area: [1:-17.70%] [2:10.00%] [3:61.40%] % Reduction in Volume: [1:Full Thickness Without Exposed] [2:Full Thickness Without Exposed] [3:Full Thickness Without Exposed] Classification: [1:Support Structures Medium] [2:Support Structures Large] [3:Support Structures Large] Exudate Amount: [1:Serous] [2:Serous] [3:Serous] Exudate Type: [1:amber] [2:amber] [3:amber] Exudate Color: [1:Distinct, outline attached] [2:Well defined, not attached] [3:Well defined, not attached] Wound Margin: [1:Small (1-33%)] [2:Medium (34-66%)] [3:Small (1-33%)] Granulation Amount: [1:Pink] [2:Pink] [3:Pink] Granulation Quality: [1:Large (67-100%)] [2:Medium (34-66%)] [3:Large (67-100%)] Necrotic Amount: [1:Fat Layer (Subcutaneous Tissue): Yes Fat Layer (Subcutaneous Tissue): Yes Fat Layer (Subcutaneous Tissue): Yes] Exposed Structures: [1:Fascia: No Tendon: No Muscle: No Joint: No Bone: No None] [2:Fascia: No Tendon: No Muscle: No Joint: No Bone: No None] [3:Fascia: No Tendon: No Muscle: No Joint: No Bone: No None] Epithelialization: [1:Compression Therapy] [2:Compression Therapy] [3:Compression [place] Wound Number: 4 5 N/A Photos: No Photos No Photos N/A Left, Proximal, Medial Lower Leg Left, Distal, Medial Lower Leg N/A Wound Location: Blister Blister N/A Wounding Event: Lymphedema Lymphedema N/A Primary Etiology: Lymphedema, Deep Vein Thrombosis, Lymphedema, Deep Vein Thrombosis, N/A Comorbid History: Hypertension, Received Radiation Hypertension, Received Radiation 01/15/2020 01/15/2020 N/A Date Acquired: 1 1 N/A Weeks of Treatment: Open Open N/A Wound Status: 2.5x1x0.9 0x0x0 N/A Measurements L x W x D (cm) 1.963 0 N/A A (cm) :  rea 1.767 0 N/A Volume (cm) : 21.90% 100.00% N/A % Reduction in Area: 21.90% 100.00%  N/A % Reduction in Volume: Full Thickness Without Exposed Full Thickness Without Exposed N/A Classification: Support Structures Support Structures Medium Small N/A Exudate Amount: Serous Serous N/A Exudate Type: Physiological scientist N/A Exudate Color: Well defined, not attached Distinct, outline attached N/A Wound Margin: Medium (34-66%) Medium (34-66%) N/A Granulation Amount: Pink Pink N/A Granulation Quality: Medium (34-66%) Medium (34-66%) N/A Necrotic Amount: Fat Layer (Subcutaneous Tissue): Yes Fat Layer (Subcutaneous Tissue): Yes N/A Exposed Structures: Fascia: No Fascia: No Tendon: No Tendon: No Muscle: No Muscle: No Joint: No Joint: No Bone: No Bone: No None Large (67-100%) N/A Epithelialization: Compression Therapy N/A N/A Procedures Performed: Treatment Notes Electronic Signature(s) Signed: 03/01/2020 5:33:07 PM By: Linton Ham MD Signed: 03/01/2020 5:51:00 PM By: Deon Pilling Entered By: Linton Ham on 03/01/2020 13:39:50 -------------------------------------------------------------------------------- Multi-Disciplinary Care Plan Details Patient Name: Date of Service: [place], DA [name] 03/01/2020 12:45 PM Medical Record Number: [id number] Patient Account Number: [id number] Date of Birth/Sex: Treating RN: 1945/12/19 (74 y.o. [name], [name] Primary Care Cale Bethard: [name] Other Clinician: Referring Shatonia Hoots: Treating June Rode/Extender: Drucilla Schmidt Weeks in Treatment: 1 Active Inactive Nutrition Nursing Diagnoses: Potential for alteratiion in Nutrition/Potential for imbalanced nutrition Goals: Patient/caregiver agrees to and verbalizes understanding of need to obtain nutritional consultation Date Initiated: 02/23/2020 Target Resolution Date: 03/23/2020 Goal Status: Active Interventions: Provide education on nutrition Treatment Activities: Patient referred to Primary Care Physician for further nutritional evaluation :  02/23/2020 Notes: Pain, Acute or Chronic Nursing Diagnoses: Pain, acute or chronic: actual or potential Potential alteration in comfort, pain Goals: Patient will verbalize adequate pain control and receive pain control interventions during procedures as needed Date Initiated: 02/23/2020 Target Resolution Date: 03/23/2020 Goal Status: Active Patient/caregiver will verbalize comfort level met Date Initiated: 02/23/2020 Target Resolution Date: 03/23/2020 Goal Status: Active Interventions: Provide education on pain management Reposition patient for comfort Notes: Wound/Skin Impairment Nursing Diagnoses: Knowledge deficit related to ulceration/compromised skin integrity Goals: Ulcer/skin breakdown will heal within 14 weeks Date Initiated: 02/23/2020 Target Resolution Date: 06/01/2020 Goal Status: Active Interventions: Assess patient/caregiver ability to obtain necessary supplies Assess patient/caregiver ability to perform ulcer/skin care regimen upon admission and as needed Provide education on ulcer and skin care Treatment Activities: Skin care regimen initiated : 02/23/2020 Topical wound management initiated : 02/23/2020 Notes: Electronic Signature(s) Signed: 03/01/2020 5:51:00 PM By: Deon Pilling Entered By: Deon Pilling on 03/01/2020 13:18:33 -------------------------------------------------------------------------------- Pain Assessment Details Patient Name: Date of Service: [place], [place] 03/01/2020 12:45 PM Medical Record Number: [id number] Patient Account Number: [id number] Date of Birth/Sex: Treating RN: 04-08-1946 (73 y.o. Oval Linsey Primary Care Karmel Patricelli: [name] Other Clinician: Referring Jac [name]: Treating Mariangela Heldt/Extender: Drucilla Schmidt Weeks in Treatment: 1 Active Problems Location of Pain Severity and Description of Pain Patient Has Paino No Site Locations Pain Management and Medication Current Pain Management: Electronic  Signature(s) Signed: 03/01/2020 5:51:40 PM By: Carlene Coria RN Entered By: Carlene Coria on 03/01/2020 12:59:51 -------------------------------------------------------------------------------- Patient/Caregiver Education Details Patient Name: Date of Service: [place], DA [name] 10/14/2021andnbsp12:45 PM Medical Record Number: [id number] Patient Account Number: [id number] Date of Birth/Gender: Treating RN: 09/28/1945 (74 y.o. [name] Primary Care Physician: [name] Other Clinician: Referring Physician: Treating Physician/Extender: Alycia Rossetti in Treatment: 1 Education Assessment Education Provided To: Patient Education Topics Provided Nutrition: Handouts: Nutrition Methods: Explain/Verbal Responses: Reinforcements needed Electronic Signature(s) Signed: 03/01/2020 5:51:00 PM By: Deon Pilling Entered By:  Deon Pilling on 03/01/2020 13:18:44 -------------------------------------------------------------------------------- Wound Assessment Details Patient Name: Date of Service: [name] 03/01/2020 12:45 PM Medical Record Number: [id number] Patient Account Number: [id number] Date of Birth/Sex: Treating RN: 31-Aug-1945 (73 y.o. Jerilynn Mages) Carlene Coria Primary Care Marranda Arakelian: [name] Other Clinician: Referring Miko Sirico: Treating Devun Anna/Extender: Drucilla Schmidt Weeks in Treatment: 1 Wound Status Wound Number: 1 Primary Lymphedema Etiology: Wound Location: Right, Medial Lower Leg Wound Status: Open Wounding Event: Blister Comorbid Lymphedema, Deep Vein Thrombosis, Hypertension, Date Acquired: 01/15/2020 History: Received Radiation Weeks Of Treatment: 1 Clustered Wound: No Wound Measurements Length: (cm) 0.7 Width: (cm) 1.7 Depth: (cm) 0.1 Area: (cm) 0.935 Volume: (cm) 0.093 % Reduction in Area: -19.1% % Reduction in Volume: -17.7% Epithelialization: None Tunneling: No Undermining: No Wound  Description Classification: Full Thickness Without Exposed Support Structures Wound Margin: Distinct, outline attached Exudate Amount: Medium Exudate Type: Serous Exudate Color: amber Foul Odor After Cleansing: No Slough/Fibrino Yes Wound Bed Granulation Amount: Small (1-33%) Exposed Structure Granulation Quality: Pink Fascia Exposed: No Necrotic Amount: Large (67-100%) Fat Layer (Subcutaneous Tissue) Exposed: Yes Necrotic Quality: Adherent Slough Tendon Exposed: No Muscle Exposed: No Joint Exposed: No Bone Exposed: No Treatment Notes Wound #1 (Right, Medial Lower Leg) 1. Cleanse With Wound Cleanser Soap and water 2. Periwound Care Moisturizing lotion TCA Cream 3. Primary Dressing Applied Calcium Alginate Ag 4. Secondary Dressing ABD Pad Dry Gauze 6. Support Layer Applied 4 layer compression wrap Notes netting. Electronic Signature(s) Signed: 03/01/2020 5:51:40 PM By: Carlene Coria RN Entered By: Carlene Coria on 03/01/2020 13:22:13 -------------------------------------------------------------------------------- Wound Assessment Details Patient Name: Date of Service: [place], [place] 03/01/2020 12:45 PM Medical Record Number: [id number] Patient Account Number: [id number] Date of Birth/Sex: Treating RN: 12-03-1945 (73 y.o. Jerilynn Mages) Carlene Coria Primary Care Gearline Spilman: [name] Other Clinician: Referring Rael Yo: Treating Kajsa Butrum/Extender: Drucilla Schmidt Weeks in Treatment: 1 Wound Status Wound Number: 2 Primary Lymphedema Etiology: Wound Location: Left, Lateral Lower Leg Wound Status: Open Wounding Event: Blister Comorbid Lymphedema, Deep Vein Thrombosis, Hypertension, Date Acquired: 01/15/2020 History: Received Radiation Weeks Of Treatment: 1 Clustered Wound: No Wound Measurements Length: (cm) 2.2 Width: (cm) 1.8 Depth: (cm) 1.5 Area: (cm) 3.11 Volume: (cm) 4.665 % Reduction in Area: 10% % Reduction in Volume:  10% Epithelialization: None Tunneling: No Undermining: No Wound Description Classification: Full Thickness Without Exposed Support Structures Wound Margin: Well defined, not attached Exudate Amount: Large Exudate Type: Serous Exudate Color: amber Foul Odor After Cleansing: No Slough/Fibrino Yes Wound Bed Granulation Amount: Medium (34-66%) Exposed Structure Granulation Quality: Pink Fascia Exposed: No Necrotic Amount: Medium (34-66%) Fat Layer (Subcutaneous Tissue) Exposed: Yes Necrotic Quality: Adherent Slough Tendon Exposed: No Muscle Exposed: No Joint Exposed: No Bone Exposed: No Treatment Notes Wound #2 (Left, Lateral Lower Leg) 1. Cleanse With Wound Cleanser Soap and water 2. Periwound Care Moisturizing lotion TCA Cream 3. Primary Dressing Applied Calcium Alginate Ag 4. Secondary Dressing ABD Pad Dry Gauze 6. Support Layer Applied 4 layer compression wrap Notes netting. Electronic Signature(s) Signed: 03/01/2020 5:51:40 PM By: Carlene Coria RN Entered By: Carlene Coria on 03/01/2020 13:22:23 -------------------------------------------------------------------------------- Wound Assessment Details Patient Name: Date of Service: [place], [place] 03/01/2020 12:45 PM Medical Record Number: [id number] Patient Account Number: [id number] Date of Birth/Sex: Treating RN: Jun 30, 1945 (73 y.o. Oval Linsey Primary Care Jenalyn Girdner: [name] Other Clinician: Referring Aalaiyah Yassin: Treating Tytiana Coles/Extender: Drucilla Schmidt Weeks in Treatment: 1 Wound Status Wound Number: 3 Primary Lymphedema Etiology: Wound Location: Left, Anterior Lower Leg Wound  Status: Open Wounding Event: Blister Comorbid Lymphedema, Deep Vein Thrombosis, Hypertension, Date Acquired: 01/15/2020 History: Received Radiation Weeks Of Treatment: 1 Clustered Wound: No Wound Measurements Length: (cm) 0.9 Width: (cm) 1.1 Depth: (cm) 0.8 Area: (cm) 0.778 Volume: (cm)  0.622 % Reduction in Area: 42.1% % Reduction in Volume: 61.4% Epithelialization: None Tunneling: No Undermining: No Wound Description Classification: Full Thickness Without Exposed Support Structures Wound Margin: Well defined, not attached Exudate Amount: Large Exudate Type: Serous Exudate Color: amber Foul Odor After Cleansing: No Slough/Fibrino Yes Wound Bed Granulation Amount: Small (1-33%) Exposed Structure Granulation Quality: Pink Fascia Exposed: No Necrotic Amount: Large (67-100%) Fat Layer (Subcutaneous Tissue) Exposed: Yes Necrotic Quality: Adherent Slough Tendon Exposed: No Muscle Exposed: No Joint Exposed: No Bone Exposed: No Treatment Notes Wound #3 (Left, Anterior Lower Leg) 1. Cleanse With Wound Cleanser Soap and water 2. Periwound Care Moisturizing lotion TCA Cream 3. Primary Dressing Applied Calcium Alginate Ag 4. Secondary Dressing ABD Pad Dry Gauze 6. Support Layer Applied 4 layer compression wrap Notes netting. Electronic Signature(s) Signed: 03/01/2020 5:51:40 PM By: Carlene Coria RN Entered By: Carlene Coria on 03/01/2020 13:22:35 -------------------------------------------------------------------------------- Wound Assessment Details Patient Name: Date of Service: [place], [place] 03/01/2020 12:45 PM Medical Record Number: [id number] Patient Account Number: [id number] Date of Birth/Sex: Treating RN: 08-04-1945 (73 y.o. Jerilynn Mages) Carlene Coria Primary Care Faten Frieson: [name] Other Clinician: Referring Keilin Gamboa: Treating Leonilda Cozby/Extender: Drucilla Schmidt Weeks in Treatment: 1 Wound Status Wound Number: 4 Primary Lymphedema Etiology: Wound Location: Left, Proximal, Medial Lower Leg Wound Status: Open Wounding Event: Blister Comorbid Lymphedema, Deep Vein Thrombosis, Hypertension, Date Acquired: 01/15/2020 History: Received Radiation Weeks Of Treatment: 1 Clustered Wound: No Wound Measurements Length: (cm) 2.5 Width:  (cm) 1 Depth: (cm) 0.9 Area: (cm) 1.963 Volume: (cm) 1.767 % Reduction in Area: 21.9% % Reduction in Volume: 21.9% Epithelialization: None Tunneling: No Undermining: No Wound Description Classification: Full Thickness Without Exposed Support Structures Wound Margin: Well defined, not attached Exudate Amount: Medium Exudate Type: Serous Exudate Color: amber Foul Odor After Cleansing: No Slough/Fibrino Yes Wound Bed Granulation Amount: Medium (34-66%) Exposed Structure Granulation Quality: Pink Fascia Exposed: No Necrotic Amount: Medium (34-66%) Fat Layer (Subcutaneous Tissue) Exposed: Yes Necrotic Quality: Adherent Slough Tendon Exposed: No Muscle Exposed: No Joint Exposed: No Bone Exposed: No Treatment Notes Wound #4 (Left, Proximal, Medial Lower Leg) 1. Cleanse With Wound Cleanser Soap and water 2. Periwound Care Moisturizing lotion TCA Cream 3. Primary Dressing Applied Calcium Alginate Ag 4. Secondary Dressing ABD Pad Dry Gauze 6. Support Layer Applied 4 layer compression wrap Notes netting. Electronic Signature(s) Signed: 03/01/2020 5:51:40 PM By: Carlene Coria RN Entered By: Carlene Coria on 03/01/2020 13:22:52 -------------------------------------------------------------------------------- Wound Assessment Details Patient Name: Date of Service: [place], [place] 03/01/2020 12:45 PM Medical Record Number: [id number] Patient Account Number: [id number] Date of Birth/Sex: Treating RN: [birth year] (73 y.o. Jerilynn Mages) Carlene Coria Primary Care Kennadie Brenner: [name] Other Clinician: Referring Kimba Lottes: Treating Akansha Wyche/Extender: Drucilla Schmidt Weeks in Treatment: 1 Wound Status Wound Number: 5 Primary Lymphedema Etiology: Wound Location: Left, Distal, Medial Lower Leg Wound Status: Open Wounding Event: Blister Comorbid Lymphedema, Deep Vein Thrombosis, Hypertension, Date Acquired: 01/15/2020 History: Received Radiation Weeks Of Treatment:  1 Clustered Wound: No Wound Measurements Length: (cm) Width: (cm) Depth: (cm) Area: (cm) Volume: (cm) 0 % Reduction in Area: 100% 0 % Reduction in Volume: 100% 0 Epithelialization: Large (67-100%) 0 Tunneling: No 0 Undermining: No Wound Description Classification: Full Thickness Without Exposed Support Structures Wound Margin: Distinct,  outline attached Exudate Amount: Small Exudate Type: Serous Exudate Color: amber Foul Odor After Cleansing: No Slough/Fibrino Yes Wound Bed Granulation Amount: Medium (34-66%) Exposed Structure Granulation Quality: Pink Fascia Exposed: No Necrotic Amount: Medium (34-66%) Fat Layer (Subcutaneous Tissue) Exposed: Yes Necrotic Quality: Adherent Slough Tendon Exposed: No Muscle Exposed: No Joint Exposed: No Bone Exposed: No Electronic Signature(s) Signed: 03/01/2020 5:51:40 PM By: Carlene Coria RN Entered By: Carlene Coria on 03/01/2020 13:23:18 -------------------------------------------------------------------------------- Vitals Details Patient Name: Date of Service: [place], DA [name] 03/01/2020 12:45 PM Medical Record Number: [id number] Patient Account Number: [id number] Date of Birth/Sex: Treating RN: 12-Feb-1946 (73 y.o. Oval Linsey Primary Care Xeng Kucher: [name] Other Clinician: Referring Makayleigh Poliquin: Treating Mikaya Bunner/Extender: Drucilla Schmidt Weeks in Treatment: 1 Vital Signs Time Taken: 12:59 Temperature (F): 97.9 Pulse (bpm): 81 Respiratory Rate (breaths/min): 18 Blood Pressure (mmHg): 190/74 Reference Range: 80 - 120 mg / dl Electronic Signature(s) Signed: 03/01/2020 5:51:40 PM By: Carlene Coria RN Entered By: Carlene Coria on 03/01/2020 12:59:45

## 2020-03-01 NOTE — Progress Notes (Signed)
[name], [name] ([id number]) Visit Report for 03/01/2020 HPI Details Patient Name: Date of Service: [name], [name] 03/01/2020 12:45 PM Medical Record Number: [id number] Patient Account Number: [id number] Date of Birth/Sex: Treating RN: [birth year] (74 y.o. [name] Primary Care Provider: [name] Other Clinician: Referring Provider: Treating Provider/Extender: Drucilla Schmidt Weeks in Treatment: 1 History of Present Illness HPI Description: ADMISSION 02/23/2020 Patient is a 74 year old man who lives in [place] who arrives accompanied by his wife. He has a history of chronic lymphedema and venous insufficiency in his bilateral lower legs which may have something to do that with having a history of DVT as well as being treated for prostate cancer. In any case he recently got compression pumps at home but compliance has been an issue here. He has compression stockings however they are probably not sufficient enough to control swelling. They tell us that things deteriorated for him in late August he was admitted to [facility] for 7 days. This was with cellulitis I think of his bilateral lower legs. Discharge he was noted to have wounds on his bilateral lower legs. He was discharged on Bactrim. They tried to get him home health through [facility] of course they declined him. His wife is been wrapping these applying some form of silver foam dressing. He has a history of wounds before although nothing that would not heal with basic home topical dressings. He has 2 areas on the left medial, left anterior and left lateral and a smaller area on the right medial. All of these have considerable depth. Past medical history includes iron deficiency anemia, lymphedema followed by the rehab center at [facility] with lymphedema wraps I believe, DVT on chronic anticoagulation, prostate cancer, chronic venous insufficiency, hypertension. As mentioned he  has compression pumps but does not use them. ABIs in our clinic were noncompressible bilaterally 10/14; patient with severe bilateral lymphedema right greater than left. He came in with bilateral lower extremity wounds left greater than right. Even though the right side has more of the edema most of the wounds here almost closed on the right medial. He has 3 remaining wounds on the left We have been using silver alginate under 4-layer compression I have been trying to get him to be compliant with his external compression pumps Electronic Signature(s) Signed: 03/01/2020 5:33:07 PM By: Linton Ham MD Entered By: Linton Ham on 03/01/2020 13:40:45 -------------------------------------------------------------------------------- Physical Exam Details Patient Name: Date of Service: [name], [name] 03/01/2020 12:45 PM Medical Record Number: [id number] Patient Account Number: [id number] Date of Birth/Sex: Treating RN: 1945-09-11 (74 y.o. [name] Primary Care Provider: [name] Other Clinician: Referring Provider: Treating Provider/Extender: Drucilla Schmidt Weeks in Treatment: 1 Constitutional Patient is hypertensive.. Pulse regular and within target range for patient.Marland Kitchen Respirations regular, non-labored and within target range.. Temperature is normal and within the target range for the patient.Marland Kitchen Appears in no distress. Notes Wound exam; paradoxically the wounds are worse on the left 3 punched-out areas. The distal medial wound has actually closed however. The area on the left medial is just about closed he has 3 deep wounds still on the medial anterior and lateral part of the left mid lower leg. No evidence of infection. As mentioned he has much more edema in the right greater than the left leg Electronic Signature(s) Signed: 03/01/2020 5:33:07 PM By: Linton Ham MD Entered By: Linton Ham on 03/01/2020  13:41:39 -------------------------------------------------------------------------------- Physician Orders Details Patient Name:  Date of Service: [name], [name] 03/01/2020 12:45 PM Medical Record Number: [id number] Patient Account Number: [id number] Date of Birth/Sex: Treating RN: 21-Jan-1946 (74 y.o. 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 - thursday Return A Dressing Change Frequency Do not change entire dressing for one week. Skin Barriers/Peri-Wound Care TCA Cream or Ointment - mixed with lotion both legs. Wound Cleansing May shower with protection. - use a cast protector, Primary Wound Dressing Wound #1 Right,Medial Lower Leg Calcium Alginate with Silver Wound #2 Left,Lateral Lower Leg Calcium Alginate with Silver Wound #3 Left,Anterior Lower Leg Calcium Alginate with Silver Wound #4 Left,Proximal,Medial Lower Leg Calcium Alginate with Silver Secondary Dressing Dry Gauze ABD pad Edema Control 4 layer compression - Bilateral - first layer of unna boot to upper portion of lower leg to aid in securing the compression wraps. Avoid standing for long periods of time Elevate legs to the level of the heart or above for 30 minutes daily and/or when sitting, a frequency of: - 3-4 times a throughout the day. Exercise regularly Segmental Compressive Device. - Patient to use lymphedema pumps twice a day for an hour each time daily. Electronic Signature(s) Signed: 03/01/2020 5:33:07 PM By: Linton Ham  MD Signed: 03/01/2020 5:51:00 PM By: Deon Pilling Entered By: Deon Pilling on 03/01/2020 13:36:38 -------------------------------------------------------------------------------- Problem List Details Patient Name: Date of Service: [name], [name] 03/01/2020 12:45 PM Medical Record Number: [id number] Patient Account Number: [id number] Date of Birth/Sex: Treating RN: 08-26-1945 (74 y.o. [name], [name] Primary Care Provider: [name] Other Clinician: Referring Provider: Treating Provider/Extender: Drucilla Schmidt Weeks in Treatment: 1 Active Problems ICD-10 Encounter Code Description Active Date MDM Diagnosis I89.0 Lymphedema, not elsewhere classified 02/23/2020 No Yes L97.822 Non-pressure chronic ulcer of other part of left lower leg with fat layer exposed10/11/2019 No Yes L97.812 Non-pressure chronic ulcer of other part of right lower leg with fat layer 02/23/2020 No Yes exposed Inactive Problems Resolved Problems Electronic Signature(s) Signed: 03/01/2020 5:33:07 PM By: Linton Ham MD Entered By: Linton Ham on 03/01/2020 13:38:25 -------------------------------------------------------------------------------- Progress Note Details Patient Name: Date of Service: [name], [name] 03/01/2020 12:45 PM Medical Record Number: [id number] Patient Account Number: [id number] Date of Birth/Sex: Treating RN: 1945-05-31 (74 y.o. [name] Primary Care Provider: [name] Other Clinician: Referring Provider: Treating Provider/Extender: Drucilla Schmidt Weeks in Treatment: 1 Subjective History of Present Illness (HPI) ADMISSION 02/23/2020 Patient is a 74 year old man who lives in [place] who arrives accompanied by his wife. He has a history of chronic lymphedema and venous insufficiency in his bilateral lower legs which may have something to do that with having a history of DVT as well as being treated for prostate cancer.  In any case he recently got compression pumps at home but compliance has been an issue here. He has compression stockings however they are probably not sufficient enough to control swelling. They tell us that things deteriorated for him in late August he was admitted to [facility] for 7 days. This was with cellulitis I think of his bilateral lower legs. Discharge he was noted to  have wounds on his bilateral lower legs. He was discharged on Bactrim. They tried to get him home health through [facility] part [facility] of course they declined him. His wife is been wrapping these applying some form of silver foam dressing. He has a history of wounds before although nothing that would not heal with basic home topical dressings. He has 2 areas on the left medial, left anterior and left lateral and a smaller area on the right medial. All of these have considerable depth. Past medical history includes iron deficiency anemia, lymphedema followed by the rehab center at [facility] with lymphedema wraps I believe, DVT on chronic anticoagulation, prostate cancer, chronic venous insufficiency, hypertension. As mentioned he has compression pumps but does not use them. ABIs in our clinic were noncompressible bilaterally 10/14; patient with severe bilateral lymphedema right greater than left. He came in with bilateral lower extremity wounds left greater than right. Even though the right side has more of the edema most of the wounds here almost closed on the right medial. He has 3 remaining wounds on the left We have been using silver alginate under 4-layer compression I have been trying to get him to be compliant with his external compression pumps Objective Constitutional Patient is hypertensive.. Pulse regular and within target range for patient.Marland Kitchen Respirations regular, non-labored and within target range.. Temperature is normal and within the target range for the patient.Marland Kitchen Appears in no distress. Vitals  Time Taken: 12:59 PM, Temperature: 97.9 F, Pulse: 81 bpm, Respiratory Rate: 18 breaths/min, Blood Pressure: 190/74 mmHg. General Notes: Wound exam; paradoxically the wounds are worse on the left 3 punched-out areas. The distal medial wound has actually closed however. The area on the left medial is just about closed he has 3 deep wounds still on the medial anterior and lateral part of the left mid lower leg. No evidence of infection. As mentioned he has much more edema in the right greater than the left leg Integumentary (Hair, Skin) Wound #1 status is Open. Original cause of wound was Blister. The wound is located on the Right,Medial Lower Leg. The wound measures 0.7cm length x 1.7cm width x 0.1cm depth; 0.935cm^2 area and 0.093cm^3 volume. There is Fat Layer (Subcutaneous Tissue) exposed. There is no tunneling or undermining noted. There is a medium amount of serous drainage noted. The wound margin is distinct with the outline attached to the wound [name]. There is small (1-33%) pink granulation within the wound bed. There is a large (67-100%) amount of necrotic tissue within the wound bed including Adherent Slough. Wound #2 status is Open. Original cause of wound was Blister. The wound is located on the Left,Lateral Lower Leg. The wound measures 2.2cm length x 1.8cm width x 1.5cm depth; 3.11cm^2 area and 4.665cm^3 volume. There is Fat Layer (Subcutaneous Tissue) exposed. There is no tunneling or undermining noted. There is a large amount of serous drainage noted. The wound margin is well defined and not attached to the wound [name]. There is medium (34-66%) pink granulation within the wound bed. There is a medium (34-66%) amount of necrotic tissue within the wound bed including Adherent Slough. Wound #3 status is Open. Original cause of wound was Blister. The wound is located on the Left,Anterior Lower Leg. The wound measures 0.9cm length x 1.1cm width x 0.8cm depth; 0.778cm^2 area and 0.622cm^3  volume. There is Fat Layer (Subcutaneous Tissue) exposed. There is no tunneling or undermining noted. There is a large amount of serous drainage noted. The  wound margin is well defined and not attached to the wound [name]. There is small (1-33%) pink granulation within the wound bed. There is a large (67-100%) amount of necrotic tissue within the wound bed including Adherent Slough. Wound #4 status is Open. Original cause of wound was Blister. The wound is located on the Left,Proximal,Medial Lower Leg. The wound measures 2.5cm length x 1cm width x 0.9cm depth; 1.963cm^2 area and 1.767cm^3 volume. There is Fat Layer (Subcutaneous Tissue) exposed. There is no tunneling or undermining noted. There is a medium amount of serous drainage noted. The wound margin is well defined and not attached to the wound [name]. There is medium (34-66%) pink granulation within the wound bed. There is a medium (34-66%) amount of necrotic tissue within the wound bed including Adherent Slough. Wound #5 status is Open. Original cause of wound was Blister. The wound is located on the Left,Distal,Medial Lower Leg. The wound measures 0cm length x 0cm width x 0cm depth; 0cm^2 area and 0cm^3 volume. There is Fat Layer (Subcutaneous Tissue) exposed. There is no tunneling or undermining noted. There is a small amount of serous drainage noted. The wound margin is distinct with the outline attached to the wound [name]. There is medium (34-66%) pink granulation within the wound bed. There is a medium (34-66%) amount of necrotic tissue within the wound bed including Adherent Slough. Assessment Active Problems ICD-10 Lymphedema, not elsewhere classified Non-pressure chronic ulcer of other part of left lower leg with fat layer exposed Non-pressure chronic ulcer of other part of right lower leg with fat layer exposed Procedures Wound #1 Pre-procedure diagnosis of Wound #1 is a Lymphedema located on the Right,Medial Lower Leg . There was a  Four Layer Compression Therapy Procedure by Baruch Gouty, RN. Post procedure Diagnosis Wound #1: Same as Pre-Procedure Wound #2 Pre-procedure diagnosis of Wound #2 is a Lymphedema located on the Left,Lateral Lower Leg . There was a Four Layer Compression Therapy Procedure by Baruch Gouty, RN. Post procedure Diagnosis Wound #2: Same as Pre-Procedure Wound #3 Pre-procedure diagnosis of Wound #3 is a Lymphedema located on the Left,Anterior Lower Leg . There was a Four Layer Compression Therapy Procedure by Baruch Gouty, RN. Post procedure Diagnosis Wound #3: Same as Pre-Procedure Wound #4 Pre-procedure diagnosis of Wound #4 is a Lymphedema located on the Left,Proximal,Medial Lower Leg . There was a Four Layer Compression Therapy Procedure by Baruch Gouty, RN. Post procedure Diagnosis Wound #4: Same as Pre-Procedure Plan Follow-up Appointments: Return Appointment in 1 week. - thursday Dressing Change Frequency: Do not change entire dressing for one week. Skin Barriers/Peri-Wound Care: TCA Cream or Ointment - mixed with lotion both legs. Wound Cleansing: May shower with protection. - use a cast protector, Primary Wound Dressing: Wound #1 Right,Medial Lower Leg: Calcium Alginate with Silver Wound #2 Left,Lateral Lower Leg: Calcium Alginate with Silver Wound #3 Left,Anterior Lower Leg: Calcium Alginate with Silver Wound #4 Left,Proximal,Medial Lower Leg: Calcium Alginate with Silver Secondary Dressing: Dry Gauze ABD pad Edema Control: 4 layer compression - Bilateral - first layer of unna boot to upper portion of lower leg to aid in securing the compression wraps. Avoid standing for long periods of time Elevate legs to the level of the heart or above for 30 minutes daily and/or when sitting, a frequency of: - 3-4 times a throughout the day. Exercise regularly Segmental Compressive Device. - Patient to use lymphedema pumps twice a day for an hour each time daily. 1. I did  not try and change the primary  dressing which is silver alginate 2. Continued 4-layer compression 3. I tried to get him degree to twice a day external compression pump usage although he complains about cramps in the medial right upper thigh. 4. As mentioned paradoxically the right leg is much worse in terms of edema than the left Electronic Signature(s) Signed: 03/01/2020 5:33:07 PM By: Linton Ham MD Entered By: Linton Ham on 03/01/2020 13:42:22 -------------------------------------------------------------------------------- SuperBill Details Patient Name: Date of Service: [name], [name] 03/01/2020 Medical Record Number: [id number] Patient Account Number: [id number] Date of Birth/Sex: Treating RN: 1945/09/15 (74 y.o. [name], [name] Primary Care Provider: [name] Other Clinician: Referring Provider: Treating Provider/Extender: Drucilla Schmidt Weeks in Treatment: 1 Diagnosis Coding ICD-10 Codes Code Description I89.0 Lymphedema, not elsewhere classified L97.822 Non-pressure chronic ulcer of other part of left lower leg with fat layer exposed L97.812 Non-pressure chronic ulcer of other part of right lower leg with fat layer exposed Facility Procedures CPT4: Code [id number] Description: 81 BILATERAL: Application of multi-layer venous compression system; leg (below knee), including ankle and t. Modifier: Quantity: 1 Physician Procedures : CPT4 Code Description Modifier [id number] - WC PHYS LEVEL 3 - EST PT ICD-10 Diagnosis Description I89.0 Lymphedema, not elsewhere classified L97.822 Non-pressure chronic ulcer of other part of left lower leg with fat layer exposed L97.812  Non-pressure chronic ulcer of other part of right lower leg with fat layer exposed Quantity: 1 Electronic Signature(s) Signed: 03/01/2020 5:33:07 PM By: Linton Ham MD Entered By: Linton Ham on 03/01/2020 13:42:40

## 2020-03-07 ENCOUNTER — Encounter (HOSPITAL_BASED_OUTPATIENT_CLINIC_OR_DEPARTMENT_OTHER): Payer: Medicare Other | Admitting: Physician Assistant

## 2020-03-08 ENCOUNTER — Encounter (HOSPITAL_BASED_OUTPATIENT_CLINIC_OR_DEPARTMENT_OTHER): Payer: Medicare Other | Admitting: Internal Medicine

## 2020-03-08 ENCOUNTER — Other Ambulatory Visit: Payer: Self-pay

## 2020-03-08 DIAGNOSIS — L97822 Non-pressure chronic ulcer of other part of left lower leg with fat layer exposed: Secondary | ICD-10-CM | POA: Diagnosis not present

## 2020-03-08 NOTE — Progress Notes (Signed)
Yasui, BRENSON ([id number]) Visit Report for 03/08/2020 Debridement Details Patient Name: Date of Service: Noack, [name] 03/08/2020 8:45 A M Medical Record Number: [id number] Patient Account Number: [id number] Date of Birth/Sex: Treating RN: [birth year] (74 y.o. [name] Primary Care Provider: [name] Other Clinician: Referring Provider: Treating Provider/Extender: Drucilla Schmidt Weeks in Treatment: 2 Debridement Performed for Assessment: Wound #1 Right,Medial Lower Leg Performed By: Physician Ricard Dillon., MD Debridement Type: Debridement Level of Consciousness (Pre-procedure): Awake and Alert Pre-procedure Verification/Time Out Yes - 10:00 Taken: Start Time: 10:01 Pain Control: Lidocaine 4% T opical Solution T Area Debrided (L x W): otal 0.5 (cm) x 1.8 (cm) = 0.9 (cm) Tissue and other material debrided: Viable, Non-Viable, Slough, Subcutaneous, Skin: Dermis , Skin: Epidermis, Fibrin/Exudate, Slough Level: Skin/Subcutaneous Tissue Debridement Description: Excisional Instrument: Curette Bleeding: Moderate Hemostasis Achieved: Pressure End Time: 10:09 Procedural Pain: 0 Post Procedural Pain: 0 Response to Treatment: Procedure was tolerated well Level of Consciousness (Post- Awake and Alert procedure): Post Debridement Measurements of Total Wound Length: (cm) 0.5 Width: (cm) 1.8 Depth: (cm) 0.2 Volume: (cm) 0.141 Character of Wound/Ulcer Post Debridement: Improved Post Procedure Diagnosis Same as Pre-procedure Electronic Signature(s) Signed: 03/08/2020 5:31:58 PM By: Linton Ham MD Signed: 03/08/2020 5:47:32 PM By: Deon Pilling Entered By: Linton Ham on 03/08/2020 10:30:43 -------------------------------------------------------------------------------- Debridement Details Patient Name: Date of Service: Loyal, [name] 03/08/2020 8:45 A M Medical Record Number: [id number] Patient Account Number: [id number] Date of  Birth/Sex: Treating RN: [birth year] (74 y.o. [name] Primary Care Provider: [name] Other Clinician: Referring Provider: Treating Provider/Extender: Drucilla Schmidt Weeks in Treatment: 2 Debridement Performed for Assessment: Wound #2 Left,Lateral Lower Leg Performed By: Physician Ricard Dillon., MD Debridement Type: Debridement Level of Consciousness (Pre-procedure): Awake and Alert Pre-procedure Verification/Time Out Yes - 10:00 Taken: Start Time: 10:01 Pain Control: Lidocaine 4% T opical Solution T Area Debrided (L x W): otal 2 (cm) x 1.6 (cm) = 3.2 (cm) Tissue and other material debrided: Viable, Non-Viable, Slough, Subcutaneous, Skin: Dermis , Skin: Epidermis, Fibrin/Exudate, Slough Level: Skin/Subcutaneous Tissue Debridement Description: Excisional Instrument: Curette Bleeding: Moderate Hemostasis Achieved: Pressure End Time: 10:09 Procedural Pain: 0 Post Procedural Pain: 0 Response to Treatment: Procedure was tolerated well Level of Consciousness (Post- Awake and Alert procedure): Post Debridement Measurements of Total Wound Length: (cm) 2 Width: (cm) 1.6 Depth: (cm) 1.1 Volume: (cm) 2.765 Character of Wound/Ulcer Post Debridement: Improved Post Procedure Diagnosis Same as Pre-procedure Electronic Signature(s) Signed: 03/08/2020 5:31:58 PM By: Linton Ham MD Signed: 03/08/2020 5:47:32 PM By: Deon Pilling Entered By: Linton Ham on 03/08/2020 10:30:51 -------------------------------------------------------------------------------- Debridement Details Patient Name: Date of Service: Domenico, [name] 03/08/2020 8:45 A M Medical Record Number: [id number] Patient Account Number: [id number] Date of Birth/Sex: Treating RN: Mar 24, 1946 (74 y.o. [name] Primary Care Provider: [name] Other Clinician: Referring Provider: Treating Provider/Extender: Drucilla Schmidt Weeks in Treatment:  2 Debridement Performed for Assessment: Wound #3 Left,Anterior Lower Leg Performed By: Physician Ricard Dillon., MD Debridement Type: Debridement Level of Consciousness (Pre-procedure): Awake and Alert Pre-procedure Verification/Time Out Yes - 10:00 Taken: Start Time: 10:01 Pain Control: Lidocaine 4% T opical Solution T Area Debrided (L x W): otal 0.9 (cm) x 1.5 (cm) = 1.35 (cm) Tissue and other material debrided: Viable, Non-Viable, Slough, Subcutaneous, Skin: Dermis , Skin: Epidermis, Fibrin/Exudate, Slough Level: Skin/Subcutaneous Tissue Debridement Description: Excisional Instrument: Curette Bleeding: Moderate Hemostasis Achieved: Pressure End Time: 10:09 Procedural Pain: 0 Post Procedural  Pain: 0 Response to Treatment: Procedure was tolerated well Level of Consciousness (Post- Awake and Alert procedure): Post Debridement Measurements of Total Wound Length: (cm) 0.9 Width: (cm) 1.5 Depth: (cm) 1.1 Volume: (cm) 1.166 Character of Wound/Ulcer Post Debridement: Improved Post Procedure Diagnosis Same as Pre-procedure Electronic Signature(s) Signed: 03/08/2020 5:31:58 PM By: Linton Ham MD Signed: 03/08/2020 5:47:32 PM By: Deon Pilling Entered By: Linton Ham on 03/08/2020 10:31:00 -------------------------------------------------------------------------------- Debridement Details Patient Name: Date of Service: Grothe, [name] 03/08/2020 8:45 A M Medical Record Number: [id number] Patient Account Number: [id number] Date of Birth/Sex: Treating RN: 1945-08-18 (74 y.o. [name] Primary Care Provider: [name] Other Clinician: Referring Provider: Treating Provider/Extender: Drucilla Schmidt Weeks in Treatment: 2 Debridement Performed for Assessment: Wound #4 Left,Proximal,Medial Lower Leg Performed By: Physician Ricard Dillon., MD Debridement Type: Debridement Level of Consciousness (Pre-procedure): Awake and  Alert Pre-procedure Verification/Time Out Yes - 10:00 Taken: Start Time: 10:01 Pain Control: Lidocaine 4% T opical Solution T Area Debrided (L x W): otal 2.4 (cm) x 1 (cm) = 2.4 (cm) Tissue and other material debrided: Viable, Non-Viable, Slough, Subcutaneous, Skin: Dermis , Skin: Epidermis, Fibrin/Exudate, Slough Level: Skin/Subcutaneous Tissue Debridement Description: Excisional Instrument: Curette Bleeding: Moderate Hemostasis Achieved: Pressure End Time: 10:09 Procedural Pain: 0 Post Procedural Pain: 0 Response to Treatment: Procedure was tolerated well Level of Consciousness (Post- Awake and Alert procedure): Post Debridement Measurements of Total Wound Length: (cm) 2.4 Width: (cm) 1 Depth: (cm) 0.7 Volume: (cm) 1.319 Character of Wound/Ulcer Post Debridement: Improved Post Procedure Diagnosis Same as Pre-procedure Electronic Signature(s) Signed: 03/08/2020 5:31:58 PM By: Linton Ham MD Signed: 03/08/2020 5:47:32 PM By: Deon Pilling Entered By: Linton Ham on 03/08/2020 10:31:09 -------------------------------------------------------------------------------- HPI Details Patient Name: Date of Service: Givler, [name] 03/08/2020 8:45 A M Medical Record Number: [id number] Patient Account Number: [id number] Date of Birth/Sex: Treating RN: Apr 22, 1946 (74 y.o. [name] Primary Care Provider: [name] Other Clinician: Referring Provider: Treating Provider/Extender: Drucilla Schmidt Weeks in Treatment: 2 History of Present Illness HPI Description: ADMISSION 02/23/2020 Patient is a 74 year old man who lives in [place] who arrives accompanied by his wife. He has a history of chronic lymphedema and venous insufficiency in his bilateral lower legs which may have something to do that with having a history of DVT as well as being treated for prostate cancer. In any case he recently got compression pumps at home but compliance has been  an issue here. He has compression stockings however they are probably not sufficient enough to control swelling. They tell us that things deteriorated for him in late August he was admitted to [facility] for 7 days. This was with cellulitis I think of his bilateral lower legs. Discharge he was noted to have wounds on his bilateral lower legs. He was discharged on Bactrim. They tried to get him home health through [facility] of course they declined him. His wife is been wrapping these applying some form of silver foam dressing. He has a history of wounds before although nothing that would not heal with basic home topical dressings. He has 2 areas on the left medial, left anterior and left lateral and a smaller area on the right medial. All of these have considerable depth. Past medical history includes iron deficiency anemia, lymphedema followed by the rehab center at [facility] with lymphedema wraps I believe, DVT on chronic anticoagulation, prostate cancer, chronic venous insufficiency, hypertension. As mentioned he has compression  pumps but does not use them. ABIs in our clinic were noncompressible bilaterally 10/14; patient with severe bilateral lymphedema right greater than left. He came in with bilateral lower extremity wounds left greater than right. Even though the right side has more of the edema most of the wounds here almost closed on the right medial. He has 3 remaining wounds on the left We have been using silver alginate under 4-layer compression I have been trying to get him to be compliant with his external compression pumps 10/21; patient with 3 small wounds on the left leg and 1 on the right medial in the setting of severe lymphedema and chronic venous insufficiency. We have been using silver alginate under 4-layer compression he is using his external compression pumps twice a day Electronic Signature(s) Signed: 03/08/2020 5:31:58 PM By: Linton Ham  MD Entered By: Linton Ham on 03/08/2020 10:32:16 -------------------------------------------------------------------------------- Physical Exam Details Patient Name: Date of Service: Cheslock, [name] 03/08/2020 8:45 A M Medical Record Number: [id number] Patient Account Number: [id number] Date of Birth/Sex: Treating RN: 1945/09/14 (74 y.o. [name] Primary Care Provider: [name] Other Clinician: Referring Provider: Treating Provider/Extender: Drucilla Schmidt Weeks in Treatment: 2 Constitutional Patient is hypertensive.. Pulse regular and within target range for patient.Marland Kitchen Respirations regular, non-labored and within target range.. Temperature is normal and within the target range for the patient.Marland Kitchen Appears in no distress. Notes Wound exam; 3 punched-out areas on the left and one on the right medial. He has severe lymphedema and chronic venous insufficiency likely these are related. However his wounds do not necessarily look reminiscent of chronic venous wounds. I debrided the edges of these areas although they bleed freely also debris on all the wound surfaces which is fibrinous. His edema in the right leg is still not very well controlled he had some form of compression garment on the upper part of his wound its created an indent I have asked him not to do this. Electronic Signature(s) Signed: 03/08/2020 5:31:58 PM By: Linton Ham MD Entered By: Linton Ham on 03/08/2020 10:33:36 -------------------------------------------------------------------------------- Physician Orders Details Patient Name: Date of Service: Laban, [name] 03/08/2020 8:45 A M Medical Record Number: [id number] Patient Account Number: [id number] Date of Birth/Sex: Treating RN: May 19, 1946 (74 y.o. 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 - thursday Return A Dressing Change Frequency Do not change entire dressing for one week. Skin Barriers/Peri-Wound Care TCA Cream or Ointment - mixed with lotion both legs. Wound Cleansing May shower with protection. - use a cast protector, Primary Wound Dressing Wound #1 Right,Medial Lower Leg Silver Collagen - moisten with hydrogel. Wound #2 Left,Lateral Lower Leg Silver Collagen - moisten with hydrogel. Wound #3 Left,Anterior Lower Leg Silver Collagen - moisten with hydrogel. Wound #4 Left,Proximal,Medial Lower Leg Silver Collagen - moisten with hydrogel. Secondary Dressing Dry Gauze ABD pad Edema Control 4 layer compression - Bilateral - first layer of unna boot to upper portion of lower leg to aid in securing the compression wraps. Avoid standing for long periods of time Elevate legs to the level of the heart or above  for 30 minutes daily and/or when sitting, a frequency of: - 3-4 times a throughout the day. Exercise regularly Segmental Compressive Device. - Patient to use lymphedema pumps twice a day for an hour each time daily. Services and Therapies rterial Studies- Bilateral - Vein and Vascular Arterial dopplers with ABIs and TBIs bilateral lower legs related to non-healing ulcers. - (ICD10 I89.0 - A Lymphedema, not elsewhere classified) Electronic Signature(s) Signed: 03/08/2020 5:31:58 PM By: Linton Ham MD Signed: 03/08/2020 5:47:32 PM By: Deon Pilling Entered By: Deon Pilling on 03/08/2020 10:09:46 Prescription  03/08/2020 -------------------------------------------------------------------------------- Battey, Donivan Scull MD Patient Name: Provider: Oct 31, 1945 [id number] Date of Birth: NPI#: Jerilynn Mages [id number] Sex: DEA #: [phone] [id number] Phone #: License #: [place] Patient Address: [place] [address] [place], [place] [address] [phone] Allergies cephalexin; mometasone furoate Provider's Orders rterial Studies- Bilateral - ICD10: I89.0 - Vein and Vascular Arterial dopplers with ABIs and TBIs bilateral lower legs related to non-healing ulcers. A Hand Signature: Date(s): Electronic Signature(s) Signed: 03/08/2020 5:31:58 PM By: Linton Ham MD Signed: 03/08/2020 5:47:32 PM By: Deon Pilling Entered By: Deon Pilling on 03/08/2020 10:09:47 -------------------------------------------------------------------------------- Problem List Details Patient Name: Date of Service: Raney, [name] 03/08/2020 8:45 A M Medical Record Number: [id number] Patient Account Number: [id number] Date of Birth/Sex: Treating RN: 21-Oct-1945 (74 y.o. [name], [name] Primary Care Provider: [name] Other Clinician: Referring Provider: Treating Provider/Extender: Drucilla Schmidt Weeks in Treatment: 2 Active Problems ICD-10 Encounter Code Description Active Date MDM Diagnosis I89.0 Lymphedema, not elsewhere classified 02/23/2020 No Yes L97.822 Non-pressure chronic ulcer of other part of left lower leg with fat layer exposed10/11/2019 No Yes L97.812 Non-pressure chronic ulcer of other part of right lower leg with fat layer 02/23/2020 No Yes exposed Inactive Problems Resolved Problems Electronic Signature(s) Signed: 03/08/2020 5:31:58 PM By: Linton Ham MD Entered By: Linton Ham on 03/08/2020  10:30:26 -------------------------------------------------------------------------------- Progress Note Details Patient Name: Date of Service: Kopischke, [name] 03/08/2020 8:45 A M Medical Record Number: [id number] Patient Account Number: [id number] Date of Birth/Sex: Treating RN: 1946/01/21 (74 y.o. [name] Primary Care Provider: [name] Other Clinician: Referring Provider: Treating Provider/Extender: Drucilla Schmidt Weeks in Treatment: 2 Subjective History of Present Illness (HPI) ADMISSION 02/23/2020 Patient is a 74 year old man who lives in [place] who arrives accompanied by his wife. He has a history of chronic lymphedema and venous insufficiency in his bilateral lower legs which may have something to do that with having a history of DVT as well as being treated for prostate cancer. In any case he recently got compression pumps at home but compliance has been an issue here. He has compression stockings however they are probably not sufficient enough to control swelling. They tell us that things deteriorated for him in late August he was admitted to [facility] for 7 days. This was with cellulitis I think of his bilateral lower legs. Discharge he was noted to have wounds on his bilateral lower legs. He was discharged on Bactrim. They tried to get him home health through [facility] part [facility] of course they declined him. His wife is been wrapping these applying some form of silver foam dressing. He has a history of wounds before although nothing that would not heal with basic home topical dressings. He has 2 areas on the left medial, left anterior and left lateral and a smaller area on the  right medial. All of these have considerable depth. Past medical history includes iron deficiency anemia, lymphedema followed by the rehab center at [facility] with lymphedema wraps I believe, DVT on chronic anticoagulation, prostate cancer, chronic venous  insufficiency, hypertension. As mentioned he has compression pumps but does not use them. ABIs in our clinic were noncompressible bilaterally 10/14; patient with severe bilateral lymphedema right greater than left. He came in with bilateral lower extremity wounds left greater than right. Even though the right side has more of the edema most of the wounds here almost closed on the right medial. He has 3 remaining wounds on the left We have been using silver alginate under 4-layer compression I have been trying to get him to be compliant with his external compression pumps 10/21; patient with 3 small wounds on the left leg and 1 on the right medial in the setting of severe lymphedema and chronic venous insufficiency. We have been using silver alginate under 4-layer compression he is using his external compression pumps twice a day Objective Constitutional Patient is hypertensive.. Pulse regular and within target range for patient.Marland Kitchen Respirations regular, non-labored and within target range.. Temperature is normal and within the target range for the patient.Marland Kitchen Appears in no distress. Vitals Time Taken: 9:10 AM, Temperature: 97.5 F, Pulse: 89 bpm, Respiratory Rate: 20 breaths/min, Blood Pressure: 188/80 mmHg. General Notes: Wound exam; 3 punched-out areas on the left and one on the right medial. He has severe lymphedema and chronic venous insufficiency likely these are related. However his wounds do not necessarily look reminiscent of chronic venous wounds. I debrided the edges of these areas although they bleed freely also debris on all the wound surfaces which is fibrinous. ooHis edema in the right leg is still not very well controlled he had some form of compression garment on the upper part of his wound its created an indent I have asked him not to do this. Integumentary (Hair, Skin) Wound #1 status is Open. Original cause of wound was Blister. The wound is located on the Right,Medial Lower Leg.  The wound measures 0.5cm length x 1.8cm width x 0.2cm depth; 0.707cm^2 area and 0.141cm^3 volume. There is Fat Layer (Subcutaneous Tissue) exposed. There is no tunneling or undermining noted. There is a medium amount of serous drainage noted. The wound margin is well defined and not attached to the wound base. There is small (1-33%) pink granulation within the wound bed. There is a large (67-100%) amount of necrotic tissue within the wound bed including Adherent Slough. Wound #2 status is Open. Original cause of wound was Blister. The wound is located on the Left,Lateral Lower Leg. The wound measures 2cm length x 1.6cm width x 1.1cm depth; 2.513cm^2 area and 2.765cm^3 volume. There is Fat Layer (Subcutaneous Tissue) exposed. There is no tunneling or undermining noted. There is a large amount of serous drainage noted. The wound margin is well defined and not attached to the wound base. There is medium (34-66%) pink granulation within the wound bed. There is a medium (34-66%) amount of necrotic tissue within the wound bed including Adherent Slough. Wound #3 status is Open. Original cause of wound was Blister. The wound is located on the Left,Anterior Lower Leg. The wound measures 0.9cm length x 1.5cm width x 1.1cm depth; 1.06cm^2 area and 1.166cm^3 volume. There is Fat Layer (Subcutaneous Tissue) exposed. There is no tunneling or undermining noted. There is a large amount of serous drainage noted. The wound margin is well defined and not attached to  the wound base. There is medium (34-66%) pink granulation within the wound bed. There is a medium (34-66%) amount of necrotic tissue within the wound bed including Adherent Slough. Wound #4 status is Open. Original cause of wound was Blister. The wound is located on the Left,Proximal,Medial Lower Leg. The wound measures 2.4cm length x 1cm width x 0.7cm depth; 1.885cm^2 area and 1.319cm^3 volume. There is Fat Layer (Subcutaneous Tissue) exposed. There is no  tunneling or undermining noted. There is a medium amount of serous drainage noted. The wound margin is well defined and not attached to the wound base. There is medium (34-66%) pink granulation within the wound bed. There is a medium (34-66%) amount of necrotic tissue within the wound bed including Adherent Slough. Assessment Active Problems ICD-10 Lymphedema, not elsewhere classified Non-pressure chronic ulcer of other part of left lower leg with fat layer exposed Non-pressure chronic ulcer of other part of right lower leg with fat layer exposed Procedures Wound #1 Pre-procedure diagnosis of Wound #1 is a Lymphedema located on the Right,Medial Lower Leg . There was a Excisional Skin/Subcutaneous Tissue Debridement with a total area of 0.9 sq cm performed by Ricard Dillon., MD. With the following instrument(s): Curette to remove Viable and Non-Viable tissue/material. Material removed includes Subcutaneous Tissue, Slough, Skin: Dermis, Skin: Epidermis, and Fibrin/Exudate after achieving pain control using Lidocaine 4% T opical Solution. A time out was conducted at 10:00, prior to the start of the procedure. A Moderate amount of bleeding was controlled with Pressure. The procedure was tolerated well with a pain level of 0 throughout and a pain level of 0 following the procedure. Post Debridement Measurements: 0.5cm length x 1.8cm width x 0.2cm depth; 0.141cm^3 volume. Character of Wound/Ulcer Post Debridement is improved. Post procedure Diagnosis Wound #1: Same as Pre-Procedure Pre-procedure diagnosis of Wound #1 is a Lymphedema located on the Right,Medial Lower Leg . There was a Four Layer Compression Therapy Procedure by Baruch Gouty, RN. Post procedure Diagnosis Wound #1: Same as Pre-Procedure Wound #2 Pre-procedure diagnosis of Wound #2 is a Lymphedema located on the Left,Lateral Lower Leg . There was a Excisional Skin/Subcutaneous Tissue Debridement with a total area of 3.2 sq cm  performed by Ricard Dillon., MD. With the following instrument(s): Curette to remove Viable and Non-Viable tissue/material. Material removed includes Subcutaneous Tissue, Slough, Skin: Dermis, Skin: Epidermis, and Fibrin/Exudate after achieving pain control using Lidocaine 4% T opical Solution. A time out was conducted at 10:00, prior to the start of the procedure. A Moderate amount of bleeding was controlled with Pressure. The procedure was tolerated well with a pain level of 0 throughout and a pain level of 0 following the procedure. Post Debridement Measurements: 2cm length x 1.6cm width x 1.1cm depth; 2.765cm^3 volume. Character of Wound/Ulcer Post Debridement is improved. Post procedure Diagnosis Wound #2: Same as Pre-Procedure Pre-procedure diagnosis of Wound #2 is a Lymphedema located on the Left,Lateral Lower Leg . There was a Four Layer Compression Therapy Procedure by Baruch Gouty, RN. Post procedure Diagnosis Wound #2: Same as Pre-Procedure Wound #3 Pre-procedure diagnosis of Wound #3 is a Lymphedema located on the Left,Anterior Lower Leg . There was a Excisional Skin/Subcutaneous Tissue Debridement with a total area of 1.35 sq cm performed by Ricard Dillon., MD. With the following instrument(s): Curette to remove Viable and Non-Viable tissue/material. Material removed includes Subcutaneous Tissue, Slough, Skin: Dermis, Skin: Epidermis, and Fibrin/Exudate after achieving pain control using Lidocaine 4% T opical Solution. A time out was conducted at 10:00,  prior to the start of the procedure. A Moderate amount of bleeding was controlled with Pressure. The procedure was tolerated well with a pain level of 0 throughout and a pain level of 0 following the procedure. Post Debridement Measurements: 0.9cm length x 1.5cm width x 1.1cm depth; 1.166cm^3 volume. Character of Wound/Ulcer Post Debridement is improved. Post procedure Diagnosis Wound #3: Same as Pre-Procedure Pre-procedure  diagnosis of Wound #3 is a Lymphedema located on the Left,Anterior Lower Leg . There was a Four Layer Compression Therapy Procedure by Baruch Gouty, RN. Post procedure Diagnosis Wound #3: Same as Pre-Procedure Wound #4 Pre-procedure diagnosis of Wound #4 is a Lymphedema located on the Left,Proximal,Medial Lower Leg . There was a Excisional Skin/Subcutaneous Tissue Debridement with a total area of 2.4 sq cm performed by Ricard Dillon., MD. With the following instrument(s): Curette to remove Viable and Non-Viable tissue/material. Material removed includes Subcutaneous Tissue, Slough, Skin: Dermis, Skin: Epidermis, and Fibrin/Exudate after achieving pain control using Lidocaine 4% T opical Solution. A time out was conducted at 10:00, prior to the start of the procedure. A Moderate amount of bleeding was controlled with Pressure. The procedure was tolerated well with a pain level of 0 throughout and a pain level of 0 following the procedure. Post Debridement Measurements: 2.4cm length x 1cm width x 0.7cm depth; 1.319cm^3 volume. Character of Wound/Ulcer Post Debridement is improved. Post procedure Diagnosis Wound #4: Same as Pre-Procedure Pre-procedure diagnosis of Wound #4 is a Lymphedema located on the Left,Proximal,Medial Lower Leg . There was a Four Layer Compression Therapy Procedure by Baruch Gouty, RN. Post procedure Diagnosis Wound #4: Same as Pre-Procedure Plan Follow-up Appointments: Return Appointment in 1 week. - thursday Dressing Change Frequency: Do not change entire dressing for one week. Skin Barriers/Peri-Wound Care: TCA Cream or Ointment - mixed with lotion both legs. Wound Cleansing: May shower with protection. - use a cast protector, Primary Wound Dressing: Wound #1 Right,Medial Lower Leg: Silver Collagen - moisten with hydrogel. Wound #2 Left,Lateral Lower Leg: Silver Collagen - moisten with hydrogel. Wound #3 Left,Anterior Lower Leg: Silver Collagen - moisten  with hydrogel. Wound #4 Left,Proximal,Medial Lower Leg: Silver Collagen - moisten with hydrogel. Secondary Dressing: Dry Gauze ABD pad Edema Control: 4 layer compression - Bilateral - first layer of unna boot to upper portion of lower leg to aid in securing the compression wraps. Avoid standing for long periods of time Elevate legs to the level of the heart or above for 30 minutes daily and/or when sitting, a frequency of: - 3-4 times a throughout the day. Exercise regularly Segmental Compressive Device. - Patient to use lymphedema pumps twice a day for an hour each time daily. Services and Therapies ordered were: Arterial Studies- Bilateral - Vein and Vascular Arterial dopplers with ABIs and TBIs bilateral lower legs related to non-healing ulcers. 1. I change the primary dressing to silver collagen moistened with hydrogel still under 4-layer compression and the pumps twice a day. The 4-layer compression is enough to get his edema down otherwise these are never going to heal 2. The appearance of the wounds makes me wonder about coexistent arterial insufficiency although I cannot feel his pulses at both the dorsalis pedis and posterior tibial. I am going to send him for formal arterial studies however predominantly due to the appearance of the wounds. 3. All wounds debrided, change the dressing to silver collagen Electronic Signature(s) Signed: 03/08/2020 5:31:58 PM By: Linton Ham MD Entered By: Linton Ham on 03/08/2020 10:34:48 -------------------------------------------------------------------------------- SuperBill Details Patient Name:  Date of Service: Bissonnette, [name] 03/08/2020 Medical Record Number: [id number] Patient Account Number: [id number] Date of Birth/Sex: Treating RN: [birth year] (74 y.o. 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

## 2020-03-08 NOTE — Progress Notes (Signed)
[name], [name] ([id number]) Visit Report for 03/08/2020 Arrival Information Details Patient Name: Date of Service: [name] 03/08/2020 8:45 A M Medical Record Number: [id number] Patient Account Number: [id number] Date of Birth/Sex: Treating RN: 12/31/1945 (74 y.o. Marvis Repress Primary Care Xaivier Malay: [name] Other Clinician: Referring Yates Weisgerber: Treating Christien Berthelot/Extender: Drucilla Schmidt Weeks in Treatment: 2 Visit Information History Since Last Visit Added or deleted any medications: No Patient Arrived: Ambulatory Any new allergies or adverse reactions: No Arrival Time: 09:01 Had a fall or experienced change in No Accompanied By: wife activities of daily living that may affect Transfer Assistance: None risk of falls: Patient Identification Verified: Yes Signs or symptoms of abuse/neglect since last visito No Secondary Verification Process Completed: Yes Hospitalized since last visit: No Patient Has Alerts: Yes Implantable device outside of the clinic excluding No Patient Alerts: Patient on Blood Thinner cellular tissue based products placed in the center on warfarin since last visit: Bilateral ABI: [place] Has Dressing in Place as Prescribed: Yes Has Compression in Place as Prescribed: Yes Pain Present Now: No Electronic Signature(s) Signed: 03/08/2020 5:18:28 PM By: Kela Millin Entered By: Kela Millin on 03/08/2020 09:10:45 -------------------------------------------------------------------------------- Compression Therapy Details Patient Name: Date of Service: [name], DA [name] 03/08/2020 8:45 A M Medical Record Number: [id number] Patient Account Number: [id number] Date of Birth/Sex: Treating RN: [birth year] (74 y.o. [name] Primary Care Danetta Prom: [name] Other Clinician: Referring Ennifer Harston: Treating Eluzer Howdeshell/Extender: Drucilla Schmidt Weeks in Treatment: 2 Compression Therapy Performed  for Wound Assessment: Wound #1 Right,Medial Lower Leg Performed By: Clinician Baruch Gouty, RN Compression Type: Four Layer Post Procedure Diagnosis Same as Pre-procedure Electronic Signature(s) Signed: 03/08/2020 5:47:32 PM By: Deon Pilling Entered By: Deon Pilling on 03/08/2020 10:05:01 -------------------------------------------------------------------------------- Compression Therapy Details Patient Name: Date of Service: [name], DA [name] 03/08/2020 8:45 A M Medical Record Number: [id number] Patient Account Number: [id number] Date of Birth/Sex: Treating RN: 1946-02-14 (74 y.o. [name] Primary Care Ardith [name]: [name] Other Clinician: Referring Deroy Noah: Treating Tyrhonda Georgiades/Extender: Drucilla Schmidt Weeks in Treatment: 2 Compression Therapy Performed for Wound Assessment: Wound #2 Left,Lateral Lower Leg Performed By: Clinician Baruch Gouty, RN Compression Type: Four Layer Post Procedure Diagnosis Same as Pre-procedure Electronic Signature(s) Signed: 03/08/2020 5:47:32 PM By: Deon Pilling Entered By: Deon Pilling on 03/08/2020 10:05:01 -------------------------------------------------------------------------------- Compression Therapy Details Patient Name: Date of Service: [name], DA [name] 03/08/2020 8:45 A M Medical Record Number: [id number] Patient Account Number: [id number] Date of Birth/Sex: Treating RN: 05-13-1946 (74 y.o. [name] Primary Care Rania Prothero: [name] Other Clinician: Referring Catrinia Racicot: Treating Aster Screws/Extender: Drucilla Schmidt Weeks in Treatment: 2 Compression Therapy Performed for Wound Assessment: Wound #3 Left,Anterior Lower Leg Performed By: Clinician Baruch Gouty, RN Compression Type: Four Layer Post Procedure Diagnosis Same as Pre-procedure Electronic Signature(s) Signed: 03/08/2020 5:47:32 PM By: Deon Pilling Entered By: Deon Pilling on 03/08/2020  10:05:02 -------------------------------------------------------------------------------- Compression Therapy Details Patient Name: Date of Service: [name], DA [name] 03/08/2020 8:45 A M Medical Record Number: [id number] Patient Account Number: [id number] Date of Birth/Sex: Treating RN: 06/26/1945 (74 y.o. [name] Primary Care Kodi Steil: [name] Other Clinician: Referring Diogenes Whirley: Treating Bryauna Byrum/Extender: Drucilla Schmidt Weeks in Treatment: 2 Compression Therapy Performed for Wound Assessment: Wound #4 Left,Proximal,Medial Lower Leg Performed By: Clinician Baruch Gouty, RN Compression Type: Four Layer Post Procedure Diagnosis Same as Pre-procedure Electronic Signature(s) Signed: 03/08/2020 5:47:32 PM By: Deon Pilling Entered By: Deon Pilling on 03/08/2020 10:05:02 --------------------------------------------------------------------------------  Encounter Discharge Information Details Patient Name: Date of Service: [name] 03/08/2020 8:45 A M Medical Record Number: [id number] Patient Account Number: [id number] Date of Birth/Sex: Treating RN: 15-Oct-1945 (74 y.o. [name] Primary Care Boston Catarino: [name] Other Clinician: Referring Jahzion Brogden: Treating Lycia Sachdeva/Extender: Drucilla Schmidt Weeks in Treatment: 2 Encounter Discharge Information Items Post Procedure Vitals Discharge Condition: Stable Temperature (F): 97.5 Ambulatory Status: Ambulatory Pulse (bpm): 89 Discharge Destination: Home Respiratory Rate (breaths/min): 18 Transportation: Private Auto Blood Pressure (mmHg): 188/80 Accompanied By: spouse Schedule Follow-up Appointment: Yes Clinical Summary of Care: Patient Declined Electronic Signature(s) Signed: 03/08/2020 5:49:14 PM By: Baruch Gouty RN, [name] Entered By: Baruch Gouty on 03/08/2020  10:47:30 -------------------------------------------------------------------------------- Lower Extremity Assessment Details Patient Name: Date of Service: [name] 03/08/2020 8:45 A M Medical Record Number: [id number] Patient Account Number: [id number] Date of Birth/Sex: Treating RN: 1946/01/07 (74 y.o. Marvis Repress Primary Care Nasya Vincent: [name] Other Clinician: Referring Falon Huesca: Treating Amritha Yorke/Extender: Drucilla Schmidt Weeks in Treatment: 2 Edema Assessment Assessed: [Left: No] [Right: No] E[Left: dema] [Right: :] Calf Left: Right: Point of Measurement: 46 cm From Medial Instep 41 cm 46 cm Ankle Left: Right: Point of Measurement: 15 cm From Medial Instep 35 cm 36 cm Vascular Assessment Pulses: Dorsalis Pedis Palpable: [Left:No] [Right:No] Electronic Signature(s) Signed: 03/08/2020 5:18:28 PM By: Kela Millin Entered By: Kela Millin on 03/08/2020 09:25:37 -------------------------------------------------------------------------------- Multi Wound Chart Details Patient Name: Date of Service: Baldy[place], DA [name] 03/08/2020 8:45 A M Medical Record Number: [id number] Patient Account Number: [id number] Date of Birth/Sex: Treating RN: Dec 25, 1945 (74 y.o. [name] Primary Care Aliena Ghrist: [name] Other Clinician: Referring Terisha Losasso: Treating Shamir Tuzzolino/Extender: Drucilla Schmidt Weeks in Treatment: 2 Vital Signs Height(in): Pulse(bpm): 9 Weight(lbs): Blood Pressure(mmHg): 188/80 Body Mass Index(BMI): Temperature(F): 97.5 Respiratory Rate(breaths/min): 20 Photos: [1:No Photos Right, Medial Lower Leg] [2:No Photos Left, Lateral Lower Leg] [3:No Photos Left, Anterior Lower Leg] Wound Location: [1:Blister] [2:Blister] [3:Blister] Wounding Event: [1:Lymphedema] [2:Lymphedema] [3:Lymphedema] Primary Etiology: [1:Lymphedema, Deep Vein Thrombosis, Lymphedema, Deep Vein Thrombosis, Lymphedema,  Deep Vein Thrombosis,] Comorbid History: [1:Hypertension, Received Radiation 01/15/2020] [2:Hypertension, Received Radiation 01/15/2020] [3:Hypertension, Received Radiation 01/15/2020] Date Acquired: [1:2] [2:2] [3:2] Weeks of Treatment: [1:Open] [2:Open] [3:Open] Wound Status: [1:0.5x1.8x0.2] [2:2x1.6x1.1] [3:0.9x1.5x1.1] Measurements L x W x D (cm) [1:0.707] [2:2.513] [3:1.06] A (cm) : rea [1:0.141] [2:2.765] [3:1.166] Volume (cm) : [1:9.90%] [2:27.30%] [3:21.10%] % Reduction in A [1:rea: -78.50%] [2:46.70%] [3:27.70%] % Reduction in Volume: [1:Full Thickness Without Exposed] [2:Full Thickness Without Exposed] [3:Full Thickness Without Exposed] Classification: [1:Support Structures Medium] [2:Support Structures Large] [3:Support Structures Large] Exudate A mount: [1:Serous] [2:Serous] [3:Serous] Exudate Type: [1:amber] [2:amber] [3:amber] Exudate Color: [1:Well defined, not attached] [2:Well defined, not attached] [3:Well defined, not attached] Wound Margin: [1:Small (1-33%)] [2:Medium (34-66%)] [3:Medium (34-66%)] Granulation A mount: [1:Pink] [2:Pink] [3:Pink] Granulation Quality: [1:Large (67-100%)] [2:Medium (34-66%)] [3:Medium (34-66%)] Necrotic A mount: [1:Fat Layer (Subcutaneous Tissue): Yes Fat Layer (Subcutaneous Tissue): Yes Fat Layer (Subcutaneous Tissue): Yes] Exposed Structures: [1:Fascia: No Tendon: No Muscle: No Joint: No Bone: No None] [2:Fascia: No Tendon: No Muscle: No Joint: No Bone: No None] [3:Fascia: No Tendon: No Muscle: No Joint: No Bone: No None] Epithelialization: [1:Debridement - Excisional] [2:Debridement - Excisional] [3:Debridement - Excisional] Debridement: Pre-procedure Verification/Time Out 10:00 [2:10:00] [3:10:00] Taken: [1:Lidocaine 4% Topical Solution] [2:Lidocaine 4% Topical Solution] [3:Lidocaine 4% Topical Solution] Pain Control: [1:Subcutaneous, Slough] [2:Subcutaneous, Slough] [3:Subcutaneous, Slough] Tissue Debrided: [1:Skin/Subcutaneous Tissue]  [2:Skin/Subcutaneous Tissue] [3:Skin/Subcutaneous Tissue] Level: [1:0.9] [2:3.2] [3:1.35] Debridement  A (sq cm): [1:rea Curette] [2:Curette] [3:Curette] Instrument: [1:Moderate] [2:Moderate] [3:Moderate] Bleeding: [1:Pressure] [2:Pressure] [3:Pressure] Hemostasis A chieved: [1:0] [2:0] [3:0] Procedural Pain: [1:0] [2:0] [3:0] Post Procedural Pain: [1:Procedure was tolerated well] [2:Procedure was tolerated well] [3:Procedure was tolerated well] Debridement Treatment Response: [1:0.5x1.8x0.2] [2:2x1.6x1.1] [3:0.9x1.5x1.1] Post Debridement Measurements L x W x D (cm) [1:0.141] [2:2.765] [3:1.166] Post Debridement Volume: (cm) [1:Compression Therapy] [2:Compression Therapy] [3:Compression Therapy] Procedures Performed: [1:Debridement] [2:Debridement 4 N/A] [3:Debridement N/A] Photos: [1:No Photos Left, Proximal, Medial Lower Leg] [2:N/A N/A] [3:N/A N/A] Wound Location: [1:Blister] [2:N/A] [3:N/A] Wounding Event: [1:Lymphedema] [2:N/A] [3:N/A] Primary Etiology: [1:Lymphedema, Deep Vein Thrombosis, N/A] [3:N/A] Comorbid History: [1:Hypertension, Received Radiation 01/15/2020] [2:N/A] [3:N/A] Date Acquired: [1:2] [2:N/A] [3:N/A] Weeks of Treatment: [1:Open] [2:N/A] [3:N/A] Wound Status: [1:2.4x1x0.7] [2:N/A] [3:N/A] Measurements L x W x D (cm) [1:1.885] [2:N/A] [3:N/A] A (cm) : rea [1:1.319] [2:N/A] [3:N/A] Volume (cm) : [1:25.00%] [2:N/A] [3:N/A] % Reduction in A [1:rea: 41.70%] [2:N/A] [3:N/A] % Reduction in Volume: [1:Full Thickness Without Exposed] [2:N/A] [3:N/A] Classification: [1:Support Structures Medium] [2:N/A] [3:N/A] Exudate A mount: [1:Serous] [2:N/A] [3:N/A] Exudate Type: [1:amber] [2:N/A] [3:N/A] Exudate Color: [1:Well defined, not attached] [2:N/A] [3:N/A] Wound Margin: [1:Medium (34-66%)] [2:N/A] [3:N/A] Granulation A mount: [1:Pink] [2:N/A] [3:N/A] Granulation Quality: [1:Medium (34-66%)] [2:N/A] [3:N/A] Necrotic A mount: [1:Fat Layer (Subcutaneous Tissue): Yes N/A]  [3:N/A] Exposed Structures: [1:Fascia: No Tendon: No Muscle: No Joint: No Bone: No None] [2:N/A] [3:N/A] Epithelialization: [1:Debridement - Excisional] [2:N/A] [3:N/A] Debridement: Pre-procedure Verification/Time Out 10:00 [2:N/A] [3:N/A] Taken: [1:Lidocaine 4% Topical Solution] [2:N/A] [3:N/A] Pain Control: [1:Subcutaneous, Slough] [2:N/A] [3:N/A] Tissue Debrided: [1:Skin/Subcutaneous Tissue] [2:N/A] [3:N/A] Level: [1:2.4] [2:N/A] [3:N/A] Debridement A (sq cm): [1:rea Curette] [2:N/A] [3:N/A] Instrument: [1:Moderate] [2:N/A] [3:N/A] Bleeding: [1:Pressure] [2:N/A] [3:N/A] Hemostasis A chieved: [1:0] [2:N/A] [3:N/A] Procedural Pain: [1:0] [2:N/A] [3:N/A] Post Procedural Pain: [1:Procedure was tolerated well] [2:N/A] [3:N/A] Debridement Treatment Response: [1:2.4x1x0.7] [2:N/A] [3:N/A] Post Debridement Measurements L x W x D (cm) [1:1.319] [2:N/A] [3:N/A] Post Debridement Volume: (cm) [1:Compression Therapy] [2:N/A] [3:N/A] Procedures Performed: [1:Debridement] Treatment Notes Electronic Signature(s) Signed: 03/08/2020 5:31:58 PM By: Linton Ham MD Signed: 03/08/2020 5:47:32 PM By: Deon Pilling Entered By: Linton Ham on 03/08/2020 10:30:33 -------------------------------------------------------------------------------- Multi-Disciplinary Care Plan Details Patient Name: Date of Service: [place], DA [name] 03/08/2020 8:45 A M Medical Record Number: [id number] Patient Account Number: [id number] Date of Birth/Sex: Treating RN: 1945-07-07 (74 y.o. [name], [name] Primary Care Janequa Kipnis: [name] Other Clinician: Referring Logyn Dedominicis: Treating Ashlley Booher/Extender: Drucilla Schmidt Weeks in Treatment: 2 Active Inactive Nutrition Nursing Diagnoses: Potential for alteratiion in Nutrition/Potential for imbalanced nutrition Goals: Patient/caregiver agrees to and verbalizes understanding of need to obtain nutritional consultation Date Initiated:  02/23/2020 Target Resolution Date: 03/23/2020 Goal Status: Active Interventions: Provide education on nutrition Treatment Activities: Education provided on Nutrition : 03/01/2020 Patient referred to Primary Care Physician for further nutritional evaluation : 02/23/2020 Notes: Pain, Acute or Chronic Nursing Diagnoses: Pain, acute or chronic: actual or potential Potential alteration in comfort, pain Goals: Patient will verbalize adequate pain control and receive pain control interventions during procedures as needed Date Initiated: 02/23/2020 Target Resolution Date: 03/23/2020 Goal Status: Active Patient/caregiver will verbalize comfort level met Date Initiated: 02/23/2020 Target Resolution Date: 03/23/2020 Goal Status: Active Interventions: Provide education on pain management Reposition patient for comfort Notes: Wound/Skin Impairment Nursing Diagnoses: Knowledge deficit related to ulceration/compromised skin integrity Goals: Ulcer/skin breakdown will heal within 14 weeks Date Initiated: 02/23/2020 Target Resolution Date: 06/01/2020 Goal Status: Active Interventions: Assess patient/caregiver ability to obtain necessary supplies Assess patient/caregiver ability to  perform ulcer/skin care regimen upon admission and as needed Provide education on ulcer and skin care Treatment Activities: Skin care regimen initiated : 02/23/2020 Topical wound management initiated : 02/23/2020 Notes: Electronic Signature(s) Signed: 03/08/2020 5:47:32 PM By: Deon Pilling Entered By: Deon Pilling on 03/08/2020 09:26:30 -------------------------------------------------------------------------------- Pain Assessment Details Patient Name: Date of Service: [place], DA [name] 03/08/2020 8:45 A M Medical Record Number: [id number] Patient Account Number: [id number] Date of Birth/Sex: Treating RN: [birth year] (74 y.o. Marvis Repress Primary Care Janaa Acero: [name] Other Clinician: Referring  Rhett Mutschler: Treating Janesha Brissette/Extender: Drucilla Schmidt Weeks in Treatment: 2 Active Problems Location of Pain Severity and Description of Pain Patient Has Paino No Site Locations Pain Management and Medication Current Pain Management: Electronic Signature(s) Signed: 03/08/2020 5:18:28 PM By: Kela Millin Entered By: Kela Millin on 03/08/2020 09:11:12 -------------------------------------------------------------------------------- Patient/Caregiver Education Details Patient Name: Date of Service: [place], DA [name] 10/21/2021andnbsp8:45 A M Medical Record Number: [id number] Patient Account Number: [id number] Date of Birth/Gender: Treating RN: 1946-04-12 (74 y.o. [name] Primary Care Physician: [name] Other Clinician: Referring Physician: Treating Physician/Extender: Alycia Rossetti in Treatment: 2 Education Assessment Education Provided To: Patient Education Topics Provided Nutrition: Handouts: Nutrition Methods: Explain/Verbal Responses: Reinforcements needed Electronic Signature(s) Signed: 03/08/2020 5:47:32 PM By: Deon Pilling Entered By: Deon Pilling on 03/08/2020 09:26:41 -------------------------------------------------------------------------------- Wound Assessment Details Patient Name: Date of Service: [place], DA [name] 03/08/2020 8:45 A M Medical Record Number: [id number] Patient Account Number: [id number] Date of Birth/Sex: Treating RN: 1946-01-28 (74 y.o. Marvis Repress Primary Care Kearstin Learn: [name] Other Clinician: Referring Alfonso Carden: Treating Ralf Konopka/Extender: Drucilla Schmidt Weeks in Treatment: 2 Wound Status Wound Number: 1 Primary Lymphedema Etiology: Wound Location: Right, Medial Lower Leg Wound Status: Open Wounding Event: Blister Comorbid Lymphedema, Deep Vein Thrombosis, Hypertension, Date Acquired: 01/15/2020 History: Received  Radiation Weeks Of Treatment: 2 Clustered Wound: No Wound Measurements Length: (cm) 0.5 Width: (cm) 1.8 Depth: (cm) 0.2 Area: (cm) 0.707 Volume: (cm) 0.141 % Reduction in Area: 9.9% % Reduction in Volume: -78.5% Epithelialization: None Tunneling: No Undermining: No Wound Description Classification: Full Thickness Without Exposed Support Structures Wound Margin: Well defined, not attached Exudate Amount: Medium Exudate Type: Serous Exudate Color: amber Foul Odor After Cleansing: No Slough/Fibrino Yes Wound Bed Granulation Amount: Small (1-33%) Exposed Structure Granulation Quality: Pink Fascia Exposed: No Necrotic Amount: Large (67-100%) Fat Layer (Subcutaneous Tissue) Exposed: Yes Necrotic Quality: Adherent Slough Tendon Exposed: No Muscle Exposed: No Joint Exposed: No Bone Exposed: No Treatment Notes Wound #1 (Right, Medial Lower Leg) 2. Periwound Care Moisturizing lotion TCA Cream 3. Primary Dressing Applied Collegen AG 4. Secondary Dressing ABD Pad Dry Gauze Drawtex 6. Support Layer Applied 4 layer compression wrap Electronic Signature(s) Signed: 03/08/2020 5:18:28 PM By: Kela Millin Entered By: Kela Millin on 03/08/2020 09:26:30 -------------------------------------------------------------------------------- Wound Assessment Details Patient Name: Date of Service: [place], DA [name] 03/08/2020 8:45 A M Medical Record Number: [id number] Patient Account Number: [id number] Date of Birth/Sex: Treating RN: [birth year] (74 y.o. Marvis Repress Primary Care Arletha Marschke: [name] Other Clinician: Referring Kannan Proia: Treating Gwendy Boeder/Extender: Drucilla Schmidt Weeks in Treatment: 2 Wound Status Wound Number: 2 Primary Lymphedema Etiology: Wound Location: Left, Lateral Lower Leg Wound Status: Open Wounding Event: Blister Comorbid Lymphedema, Deep Vein Thrombosis, Hypertension, Date Acquired: 01/15/2020 History:  Received Radiation Weeks Of Treatment: 2 Clustered Wound: No Wound Measurements Length: (cm) 2 Width: (cm) 1.6 Depth: (cm) 1.1 Area: (cm) 2.513 Volume: (cm) 2.765 % Reduction in  Area: 27.3% % Reduction in Volume: 46.7% Epithelialization: None Tunneling: No Undermining: No Wound Description Classification: Full Thickness Without Exposed Support Structures Wound Margin: Well defined, not attached Exudate Amount: Large Exudate Type: Serous Exudate Color: amber Foul Odor After Cleansing: No Slough/Fibrino Yes Wound Bed Granulation Amount: Medium (34-66%) Exposed Structure Granulation Quality: Pink Fascia Exposed: No Necrotic Amount: Medium (34-66%) Fat Layer (Subcutaneous Tissue) Exposed: Yes Necrotic Quality: Adherent Slough Tendon Exposed: No Muscle Exposed: No Joint Exposed: No Bone Exposed: No Treatment Notes Wound #2 (Left, Lateral Lower Leg) 2. Periwound Care Moisturizing lotion TCA Cream 3. Primary Dressing Applied Collegen AG 4. Secondary Dressing ABD Pad Dry Gauze Drawtex 6. Support Layer Applied 4 layer compression wrap Electronic Signature(s) Signed: 03/08/2020 5:18:28 PM By: Kela Millin Entered By: Kela Millin on 03/08/2020 09:26:52 -------------------------------------------------------------------------------- Wound Assessment Details Patient Name: Date of Service: [place]czak, DA [name] 03/08/2020 8:45 A M Medical Record Number: [id number] Patient Account Number: [id number] Date of Birth/Sex: Treating RN: 1946-04-20 (74 y.o. Marvis Repress Primary Care Coralie Stanke: [name] Other Clinician: Referring Dashun Borre: Treating Chirstina Haan/Extender: Drucilla Schmidt Weeks in Treatment: 2 Wound Status Wound Number: 3 Primary Lymphedema Etiology: Wound Location: Left, Anterior Lower Leg Wound Status: Open Wounding Event: Blister Comorbid Lymphedema, Deep Vein Thrombosis, Hypertension, Date Acquired:  01/15/2020 History: Received Radiation Weeks Of Treatment: 2 Clustered Wound: No Wound Measurements Length: (cm) 0.9 Width: (cm) 1.5 Depth: (cm) 1.1 Area: (cm) 1.06 Volume: (cm) 1.166 % Reduction in Area: 21.1% % Reduction in Volume: 27.7% Epithelialization: None Tunneling: No Undermining: No Wound Description Classification: Full Thickness Without Exposed Support Structures Wound Margin: Well defined, not attached Exudate Amount: Large Exudate Type: Serous Exudate Color: amber Foul Odor After Cleansing: No Slough/Fibrino Yes Wound Bed Granulation Amount: Medium (34-66%) Exposed Structure Granulation Quality: Pink Fascia Exposed: No Necrotic Amount: Medium (34-66%) Fat Layer (Subcutaneous Tissue) Exposed: Yes Necrotic Quality: Adherent Slough Tendon Exposed: No Muscle Exposed: No Joint Exposed: No Bone Exposed: No Treatment Notes Wound #3 (Left, Anterior Lower Leg) 2. Periwound Care Moisturizing lotion TCA Cream 3. Primary Dressing Applied Collegen AG 4. Secondary Dressing ABD Pad Dry Gauze Drawtex 6. Support Layer Applied 4 layer compression wrap Electronic Signature(s) Signed: 03/08/2020 5:18:28 PM By: Kela Millin Entered By: Kela Millin on 03/08/2020 09:27:25 -------------------------------------------------------------------------------- Wound Assessment Details Patient Name: Date of Service: Pavlov, DA [name] 03/08/2020 8:45 A M Medical Record Number: [id number] Patient Account Number: [id number] Date of Birth/Sex: Treating RN: Jun 07, 1945 (74 y.o. Marvis Repress Primary Care Arshi Duarte: [name] Other Clinician: Referring Markasia Carrol: Treating Alyx Gee/Extender: Drucilla Schmidt Weeks in Treatment: 2 Wound Status Wound Number: 4 Primary Lymphedema Etiology: Wound Location: Left, Proximal, Medial Lower Leg Wound Status: Open Wounding Event: Blister Comorbid Lymphedema, Deep Vein Thrombosis,  Hypertension, Date Acquired: 01/15/2020 History: Received Radiation Weeks Of Treatment: 2 Clustered Wound: No Wound Measurements Length: (cm) 2.4 Width: (cm) 1 Depth: (cm) 0.7 Area: (cm) 1.885 Volume: (cm) 1.319 % Reduction in Area: 25% % Reduction in Volume: 41.7% Epithelialization: None Tunneling: No Undermining: No Wound Description Classification: Full Thickness Without Exposed Support Structures Wound Margin: Well defined, not attached Exudate Amount: Medium Exudate Type: Serous Exudate Color: amber Foul Odor After Cleansing: No Slough/Fibrino Yes Wound Bed Granulation Amount: Medium (34-66%) Exposed Structure Granulation Quality: Pink Fascia Exposed: No Necrotic Amount: Medium (34-66%) Fat Layer (Subcutaneous Tissue) Exposed: Yes Necrotic Quality: Adherent Slough Tendon Exposed: No Muscle Exposed: No Joint Exposed: No Bone Exposed: No Treatment Notes Wound #4 (Left, Proximal, Medial Lower Leg)  2. Periwound Care Moisturizing lotion TCA Cream 3. Primary Dressing Applied Collegen AG 4. Secondary Dressing ABD Pad Dry Gauze Drawtex 6. Support Layer Applied 4 layer compression wrap Electronic Signature(s) Signed: 03/08/2020 5:18:28 PM By: Kela Millin Entered By: Kela Millin on 03/08/2020 09:27:50 -------------------------------------------------------------------------------- Vitals Details Patient Name: Date of Service: [place], DA [name] 03/08/2020 8:45 A M Medical Record Number: [id number] Patient Account Number: [id number] Date of Birth/Sex: Treating RN: 08-27-1945 (74 y.o. Marvis Repress Primary Care Agapito [place]: [name] Other Clinician: Referring Prince Couey: Treating Hadli Vandemark/Extender: Drucilla Schmidt Weeks in Treatment: 2 Vital Signs Time Taken: 09:10 Temperature (F): 97.5 Pulse (bpm): 89 Respiratory Rate (breaths/min): 20 Blood Pressure (mmHg): 188/80 Reference Range: 80 - 120 mg / dl Electronic  Signature(s) Signed: 03/08/2020 5:18:28 PM By: Kela Millin Entered By: Kela Millin on 03/08/2020 09:11:07

## 2020-03-15 ENCOUNTER — Other Ambulatory Visit: Payer: Self-pay

## 2020-03-15 ENCOUNTER — Encounter (HOSPITAL_BASED_OUTPATIENT_CLINIC_OR_DEPARTMENT_OTHER): Payer: Medicare Other | Admitting: Internal Medicine

## 2020-03-15 DIAGNOSIS — L97822 Non-pressure chronic ulcer of other part of left lower leg with fat layer exposed: Secondary | ICD-10-CM | POA: Diagnosis not present

## 2020-03-15 NOTE — Progress Notes (Signed)
[name] ([id number]) Visit Report for 03/15/2020 Arrival Information Details Patient Name: Date of Service: [name], [name] 03/15/2020 12:45 PM Medical Record Number: [id number] Patient Account Number: [id number] Date of Birth/Sex: Treating RN: [birth year] (74 y.o. [name] Primary Care Jeanne Diefendorf: [name] Other Clinician: Referring Isais Klipfel: Treating Shan Padgett/Extender: Drucilla Schmidt Weeks in Treatment: 3 Visit Information History Since Last Visit Added or deleted any medications: No Patient Arrived: Ambulatory Any new allergies or adverse reactions: No Arrival Time: 12:56 Had a fall or experienced change in No Accompanied By: wife activities of daily living that may affect Transfer Assistance: None risk of falls: Patient Identification Verified: Yes Signs or symptoms of abuse/neglect since last visito No Secondary Verification Process Completed: Yes Hospitalized since last visit: No Patient Requires Transmission-Based Precautions: No Implantable device outside of the clinic excluding No Patient Has Alerts: Yes cellular tissue based products placed in the center Patient Alerts: Patient on Blood Thinner since last visit: on warfarin Has Dressing in Place as Prescribed: Yes Bilateral ABI: [place] Has Compression in Place as Prescribed: Yes Pain Present Now: No Electronic Signature(s) Signed: 03/15/2020 5:24:43 PM By: Deon Pilling Entered By: Deon Pilling on 03/15/2020 13:09:01 -------------------------------------------------------------------------------- Compression Therapy Details Patient Name: Date of Service: [name] 03/15/2020 12:45 PM Medical Record Number: [id number] Patient Account Number: [id number] Date of Birth/Sex: Treating RN: Feb 03, 1946 (74 y.o. [name] Primary Care Berit Raczkowski: [name] Other Clinician: Referring Cuauhtemoc Huegel: Treating Clever Geraldo/Extender: Drucilla Schmidt Weeks in  Treatment: 3 Compression Therapy Performed for Wound Assessment: Wound #1 Right,Medial Lower Leg Performed By: Clinician Baruch Gouty, RN Compression Type: Four Layer Post Procedure Diagnosis Same as Pre-procedure Electronic Signature(s) Signed: 03/15/2020 5:24:43 PM By: Deon Pilling Entered By: Deon Pilling on 03/15/2020 13:38:19 -------------------------------------------------------------------------------- Compression Therapy Details Patient Name: Date of Service: [name] 03/15/2020 12:45 PM Medical Record Number: [id number] Patient Account Number: [id number] Date of Birth/Sex: Treating RN: 19-Apr-1946 (74 y.o. [name] Primary Care Danni Shima: [name] Other Clinician: Referring Renaldo Gornick: Treating Summerlyn Fickel/Extender: Drucilla Schmidt Weeks in Treatment: 3 Compression Therapy Performed for Wound Assessment: Wound #4 Left,Proximal,Medial Lower Leg Performed By: Clinician Baruch Gouty, RN Compression Type: Four Layer Post Procedure Diagnosis Same as Pre-procedure Electronic Signature(s) Signed: 03/15/2020 5:24:43 PM By: Deon Pilling Entered By: Deon Pilling on 03/15/2020 13:38:19 -------------------------------------------------------------------------------- Compression Therapy Details Patient Name: Date of Service: [name] 03/15/2020 12:45 PM Medical Record Number: [id number] Patient Account Number: [id number] Date of Birth/Sex: Treating RN: 11-Aug-1945 (74 y.o. [name] Primary Care Janiene Aarons: [name] Other Clinician: Referring Tino Ronan: Treating Shirla Hodgkiss/Extender: Drucilla Schmidt Weeks in Treatment: 3 Compression Therapy Performed for Wound Assessment: Wound #3 Left,Anterior Lower Leg Performed By: Clinician Baruch Gouty, RN Compression Type: Four Layer Post Procedure Diagnosis Same as Pre-procedure Electronic Signature(s) Signed: 03/15/2020 5:24:43 PM By: Deon Pilling Entered By: Deon Pilling on 03/15/2020 13:38:19 -------------------------------------------------------------------------------- Compression Therapy Details Patient Name: Date of Service: [name] 03/15/2020 12:45 PM Medical Record Number: [id number] Patient Account Number: [id number] Date of Birth/Sex: Treating RN: 1945/09/30 (74 y.o. [name] Primary Care Shatima Zalar: [name] Other Clinician: Referring Eldena Dede: Treating Marketia Stallsmith/Extender: Drucilla Schmidt Weeks in Treatment: 3 Compression Therapy Performed for Wound Assessment: Wound #2 Left,Lateral Lower Leg Performed By: Clinician Baruch Gouty, RN Compression Type: Four Layer Post Procedure Diagnosis Same as Pre-procedure Electronic Signature(s) Signed: 03/15/2020 5:24:43 PM By: Deon Pilling Entered By: Deon Pilling on 03/15/2020 13:38:19 --------------------------------------------------------------------------------  Encounter Discharge Information Details Patient Name: Date of Service: [name] 03/15/2020 12:45 PM Medical Record Number: [id number] Patient Account Number: [id number] Date of Birth/Sex: Treating RN: Nov 14, 1945 (74 y.o. [name] Primary Care Omolola Mittman: [name] Other Clinician: Referring Davion Meara: Treating Eren Ryser/Extender: Alycia Rossetti in Treatment: 3 Encounter Discharge Information Items Discharge Condition: Stable Ambulatory Status: Ambulatory Discharge Destination: Home Transportation: Private Auto Accompanied By: spouse Schedule Follow-up Appointment: Yes Clinical Summary of Care: Patient Declined Electronic Signature(s) Signed: 03/15/2020 5:19:05 PM By: Baruch Gouty RN, [name] Entered By: Baruch Gouty on 03/15/2020 14:14:09 -------------------------------------------------------------------------------- Lower Extremity Assessment Details Patient Name: Date of Service: [name]  03/15/2020 12:45 PM Medical Record Number: [id number] Patient Account Number: [id number] Date of Birth/Sex: Treating RN: Aug 14, 1945 (73 y.o. [name] Primary Care Ramona Ruark: [name] Other Clinician: Referring Keyarra Rendall: Treating Oshae Simmering/Extender: Drucilla Schmidt Weeks in Treatment: 3 Edema Assessment Assessed: [name]: Yes] [name]: Yes] Edema: [Left: Yes] [Right: Yes] Calf Left: Right: Point of Measurement: 46 cm From Medial Instep 39 cm 48 cm Ankle Left: Right: Point of Measurement: 15 cm From Medial Instep 34.5 cm 35.8 cm Electronic Signature(s) Signed: 03/15/2020 5:24:43 PM By: Deon Pilling Entered By: Deon Pilling on 03/15/2020 13:09:40 -------------------------------------------------------------------------------- [place] Details Patient Name: Date of Service: [place], [place] 03/15/2020 12:45 PM Medical Record Number: [id number] Patient Account Number: [id number] Date of Birth/Sex: Treating RN: 27-Sep-1945 (74 y.o. [name], [name] Primary Care Kashira Behunin: [name] Other Clinician: Referring Ranya Fiddler: Treating Cassity Christian/Extender: Drucilla Schmidt Weeks in Treatment: 3 Active Inactive Nutrition Nursing Diagnoses: Potential for alteratiion in Nutrition/Potential for imbalanced nutrition Goals: Patient/caregiver agrees to and verbalizes understanding of need to obtain nutritional consultation Date Initiated: 02/23/2020 T arget Resolution Date: 03/23/2020 Goal Status: Active Interventions: Provide education on nutrition Treatment Activities: Education provided on Nutrition : 03/08/2020 Patient referred to Primary Care Physician for further nutritional evaluation : 02/23/2020 Notes: Pain, Acute or Chronic Nursing Diagnoses: Pain, acute or chronic: actual or potential Potential alteration in comfort, pain Goals: Patient will verbalize adequate pain control and receive pain control interventions  during procedures as needed Date Initiated: 02/23/2020 Target Resolution Date: 03/23/2020 Goal Status: Active Patient/caregiver will verbalize comfort level met Date Initiated: 02/23/2020 Target Resolution Date: 03/23/2020 Goal Status: Active Interventions: Provide education on pain management Reposition patient for comfort Notes: Wound/Skin Impairment Nursing Diagnoses: Knowledge deficit related to ulceration/compromised skin integrity Goals: Ulcer/skin breakdown will heal within 14 weeks Date Initiated: 02/23/2020 Target Resolution Date: 06/01/2020 Goal Status: Active Interventions: Assess patient/caregiver ability to obtain necessary supplies Assess patient/caregiver ability to perform ulcer/skin care regimen upon admission and as needed Provide education on ulcer and skin care Treatment Activities: Skin care regimen initiated : 02/23/2020 Topical wound management initiated : 02/23/2020 Notes: Electronic Signature(s) Signed: 03/15/2020 5:24:43 PM By: Deon Pilling Entered By: Deon Pilling on 03/15/2020 13:22:10 -------------------------------------------------------------------------------- Pain Assessment Details Patient Name: Date of Service: [place], [place] 03/15/2020 12:45 PM Medical Record Number: [id number] Patient Account Number: [id number] Date of Birth/Sex: Treating RN: January 26, 1946 (74 y.o. [name] Primary Care Samyah Bilbo: [name] Other Clinician: Referring Mervin Ramires: Treating Tomekia Helton/Extender: Drucilla Schmidt Weeks in Treatment: 3 Active Problems Location of Pain Severity and Description of Pain Patient Has Paino No Site Locations Rate the pain. Current Pain Level: 0 Pain Management and Medication Current Pain Management: Medication: No Cold Application: No Rest: No Massage: No Activity: No T.E.N.S.: No Heat Application: No Leg drop or  elevation: No Is the Current Pain Management Adequate: Adequate How does your wound  impact your activities of daily livingo Sleep: No Bathing: No Appetite: No Relationship With Others: No Bladder Continence: No Emotions: No Bowel Continence: No Work: No Toileting: No Drive: No Dressing: No Hobbies: No Electronic Signature(s) Signed: 03/15/2020 5:24:43 PM By: Deon Pilling Entered By: Deon Pilling on 03/15/2020 13:09:22 -------------------------------------------------------------------------------- Patient/Caregiver Education Details Patient Name: Date of Service: Cavazos, DA [name] 10/28/2021andnbsp12:45 PM Medical Record Number: [id number] Patient Account Number: [id number] Date of Birth/Gender: Treating RN: 06/22/1945 (74 y.o. [name] Primary Care Physician: [name] Other Clinician: Referring Physician: Treating Physician/Extender: Alycia Rossetti in Treatment: 3 Education Assessment Education Provided To: Patient Education Topics Provided Nutrition: Handouts: Nutrition Methods: Explain/Verbal Responses: Reinforcements needed Electronic Signature(s) Signed: 03/15/2020 5:24:43 PM By: Deon Pilling Entered By: Deon Pilling on 03/15/2020 13:22:21 -------------------------------------------------------------------------------- Wound Assessment Details Patient Name: Date of Service: [place], DA [name] 03/15/2020 12:45 PM Medical Record Number: [id number] Patient Account Number: [id number] Date of Birth/Sex: Treating RN: June 02, 1945 (74 y.o. [name], [name] Primary Care Saxon Barich: [name] Other Clinician: Referring Zaylie Gisler: Treating Merle Cirelli/Extender: Drucilla Schmidt Weeks in Treatment: 3 Wound Status Wound Number: 1 Primary Lymphedema Etiology: Wound Location: Right, Medial Lower Leg Wound Status: Open Wounding Event: Blister Comorbid Lymphedema, Deep Vein Thrombosis, Hypertension, Date Acquired: 01/15/2020 History: Received Radiation Weeks Of Treatment: 3 Clustered Wound:  No Wound Measurements Length: (cm) 0.4 Width: (cm) 0.7 Depth: (cm) 0.2 Area: (cm) 0.22 Volume: (cm) 0.044 % Reduction in Area: 72% % Reduction in Volume: 44.3% Epithelialization: Medium (34-66%) Tunneling: No Undermining: No Wound Description Classification: Full Thickness Without Exposed Support Structures Wound Margin: Well defined, not attached Exudate Amount: Medium Exudate Type: Serous Exudate Color: amber Foul Odor After Cleansing: No Slough/Fibrino Yes Wound Bed Granulation Amount: Small (1-33%) Exposed Structure Granulation Quality: Pink Fascia Exposed: No Necrotic Amount: Large (67-100%) Fat Layer (Subcutaneous Tissue) Exposed: Yes Necrotic Quality: Adherent Slough Tendon Exposed: No Muscle Exposed: No Joint Exposed: No Bone Exposed: No Treatment Notes Wound #1 (Right, Medial Lower Leg) 2. Periwound Care Moisturizing lotion 3. Primary Dressing Applied Collegen AG Hydrogel or K-Y Jelly 4. Secondary Dressing ABD Pad Dry Gauze Drawtex 6. Support Layer Applied 4 layer compression wrap Electronic Signature(s) Signed: 03/15/2020 5:24:43 PM By: Deon Pilling Entered By: Deon Pilling on 03/15/2020 13:10:06 -------------------------------------------------------------------------------- Wound Assessment Details Patient Name: Date of Service: Mcmillen, DA [name] 03/15/2020 12:45 PM Medical Record Number: [id number] Patient Account Number: [id number] Date of Birth/Sex: Treating RN: 1946-03-18 (74 y.o. [name], [name] Primary Care Sameena Artus: [name] Other Clinician: Referring Terese Heier: Treating Jobe Mutch/Extender: Drucilla Schmidt Weeks in Treatment: 3 Wound Status Wound Number: 2 Primary Lymphedema Etiology: Wound Location: Left, Lateral Lower Leg Wound Status: Open Wounding Event: Blister Comorbid Lymphedema, Deep Vein Thrombosis, Hypertension, Date Acquired: 01/15/2020 History: Received Radiation Weeks Of Treatment:  3 Clustered Wound: No Wound Measurements Length: (cm) 2.2 Width: (cm) 1 Depth: (cm) 0.5 Area: (cm) 1.728 Volume: (cm) 0.864 % Reduction in Area: 50% % Reduction in Volume: 83.3% Epithelialization: Small (1-33%) Tunneling: No Undermining: No Wound Description Classification: Full Thickness Without Exposed Support Structures Wound Margin: Well defined, not attached Exudate Amount: Large Exudate Type: Serous Exudate Color: amber Foul Odor After Cleansing: No Slough/Fibrino Yes Wound Bed Granulation Amount: Large (67-100%) Exposed Structure Granulation Quality: Pink Fascia Exposed: No Necrotic Amount: Small (1-33%) Fat Layer (Subcutaneous Tissue) Exposed: Yes Necrotic Quality: Adherent Slough Tendon Exposed: No Muscle Exposed: No  Joint Exposed: No Bone Exposed: No Treatment Notes Wound #2 (Left, Lateral Lower Leg) 2. Periwound Care Moisturizing lotion 3. Primary Dressing Applied Collegen AG Hydrogel or K-Y Jelly 4. Secondary Dressing ABD Pad Dry Gauze Drawtex 6. Support Layer Applied 4 layer compression wrap Electronic Signature(s) Signed: 03/15/2020 5:24:43 PM By: Deon Pilling Entered By: Deon Pilling on 03/15/2020 13:10:30 -------------------------------------------------------------------------------- Wound Assessment Details Patient Name: Date of Service: Behne, DA [name] 03/15/2020 12:45 PM Medical Record Number: [id number] Patient Account Number: [id number] Date of Birth/Sex: Treating RN: [birth year] (74 y.o. [name], [name] Primary Care Beverley Sherrard: [name] Other Clinician: Referring Tanishka Drolet: Treating Audreyanna Butkiewicz/Extender: Drucilla Schmidt Weeks in Treatment: 3 Wound Status Wound Number: 3 Primary Lymphedema Etiology: Wound Location: Left, Anterior Lower Leg Wound Status: Open Wounding Event: Blister Comorbid Lymphedema, Deep Vein Thrombosis, Hypertension, Date Acquired: 01/15/2020 History: Received Radiation Weeks Of  Treatment: 3 Clustered Wound: No Wound Measurements Length: (cm) 0.5 Width: (cm) 1.3 Depth: (cm) 0.4 Area: (cm) 0.511 Volume: (cm) 0.204 % Reduction in Area: 62% % Reduction in Volume: 87.3% Epithelialization: Medium (34-66%) Tunneling: No Undermining: No Wound Description Classification: Full Thickness Without Exposed Support Structures Wound Margin: Well defined, not attached Exudate Amount: Large Exudate Type: Serous Exudate Color: amber Foul Odor After Cleansing: No Slough/Fibrino Yes Wound Bed Granulation Amount: Large (67-100%) Exposed Structure Granulation Quality: Pink Fascia Exposed: No Necrotic Amount: Small (1-33%) Fat Layer (Subcutaneous Tissue) Exposed: Yes Necrotic Quality: Adherent Slough Tendon Exposed: No Muscle Exposed: No Joint Exposed: No Bone Exposed: No Treatment Notes Wound #3 (Left, Anterior Lower Leg) 2. Periwound Care Moisturizing lotion 3. Primary Dressing Applied Collegen AG Hydrogel or K-Y Jelly 4. Secondary Dressing ABD Pad Dry Gauze Drawtex 6. Support Layer Applied 4 layer compression wrap Electronic Signature(s) Signed: 03/15/2020 5:24:43 PM By: Deon Pilling Entered By: Deon Pilling on 03/15/2020 13:11:01 -------------------------------------------------------------------------------- Wound Assessment Details Patient Name: Date of Service: Pink, DA [name] 03/15/2020 12:45 PM Medical Record Number: [id number] Patient Account Number: [id number] Date of Birth/Sex: Treating RN: 08-05-1945 (74 y.o. [name], [name] Primary Care Inza Mikrut: [name] Other Clinician: Referring Lahari Suttles: Treating Shelley Pooley/Extender: Drucilla Schmidt Weeks in Treatment: 3 Wound Status Wound Number: 4 Primary Lymphedema Etiology: Wound Location: Left, Proximal, Medial Lower Leg Wound Status: Open Wounding Event: Blister Comorbid Lymphedema, Deep Vein Thrombosis, Hypertension, Date Acquired: 01/15/2020 History: Received  Radiation Weeks Of Treatment: 3 Clustered Wound: No Wound Measurements Length: (cm) 1.5 Width: (cm) 1.3 Depth: (cm) 0.3 Area: (cm) 1.532 Volume: (cm) 0.459 % Reduction in Area: 39% % Reduction in Volume: 79.7% Epithelialization: Medium (34-66%) Tunneling: No Undermining: No Wound Description Classification: Full Thickness Without Exposed Support Structures Wound Margin: Well defined, not attached Exudate Amount: Medium Exudate Type: Serous Exudate Color: amber Foul Odor After Cleansing: No Slough/Fibrino Yes Wound Bed Granulation Amount: Large (67-100%) Exposed Structure Granulation Quality: Pink Fascia Exposed: No Necrotic Amount: Small (1-33%) Fat Layer (Subcutaneous Tissue) Exposed: Yes Necrotic Quality: Adherent Slough Tendon Exposed: No Muscle Exposed: No Joint Exposed: No Bone Exposed: No Treatment Notes Wound #4 (Left, Proximal, Medial Lower Leg) 2. Periwound Care Moisturizing lotion 3. Primary Dressing Applied Collegen AG Hydrogel or K-Y Jelly 4. Secondary Dressing ABD Pad Dry Gauze Drawtex 6. Support Layer Applied 4 layer compression wrap Electronic Signature(s) Signed: 03/15/2020 5:24:43 PM By: Deon Pilling Entered By: Deon Pilling on 03/15/2020 13:11:26 -------------------------------------------------------------------------------- Vitals Details Patient Name: Date of Service: [place], [place] 03/15/2020 12:45 PM Medical Record Number: [id number] Patient Account Number: [id number] Date of Birth/Sex: Treating RN:  [birth year] (74 y.o. [name] Primary Care Caryssa Elzey: [name] Other Clinician: Referring Nastacia Raybuck: Treating Areg Bialas/Extender: Drucilla Schmidt Weeks in Treatment: 3 Vital Signs Time Taken: 12:58 Temperature (F): 98.3 Pulse (bpm): 84 Respiratory Rate (breaths/min): 20 Blood Pressure (mmHg): 170/63 Reference Range: 80 - 120 mg / dl Electronic Signature(s) Signed: 03/15/2020 5:24:43 PM By:  Deon Pilling Entered By: Deon Pilling on 03/15/2020 13:09:14

## 2020-03-15 NOTE — Progress Notes (Signed)
Mas, OCIE ([id number]) Visit Report for 03/15/2020 Physician Orders Details Patient Name: Date of Service: [name], [name] 03/15/2020 12:45 PM Medical Record Number: [id number] Patient Account Number: [id number] Date of Birth/Sex: Treating RN: 04/05/1946 (74 y.o. [name], [name] Primary Care Provider: [name] Other Clinician: Referring Provider: Treating Provider/Extender: Drucilla Schmidt Weeks in Treatment: 3 Verbal / Phone Orders: No Diagnosis Coding ICD-10 Coding Code Description I89.0 Lymphedema, not elsewhere classified L97.822 Non-pressure chronic ulcer of other part of left lower leg with fat layer exposed L97.812 Non-pressure chronic ulcer of other part of right lower leg with fat layer exposed Follow-up Appointments ppointment in 1 week. - thursday Return A Dressing Change Frequency Do not change entire dressing for one week. Skin Barriers/Peri-Wound Care TCA Cream or Ointment - mixed with lotion both legs. Wound Cleansing May shower with protection. - use a cast protector, Primary Wound Dressing Wound #1 Right,Medial Lower Leg Silver Collagen - moisten with hydrogel. Wound #2 Left,Lateral Lower Leg Silver Collagen - moisten with hydrogel. Wound #3 Left,Anterior Lower Leg Silver Collagen - moisten with hydrogel. Wound #4 Left,Proximal,Medial Lower Leg Silver Collagen - moisten with hydrogel. Secondary Dressing Dry Gauze ABD pad Drawtex - may use to feel in spaces of the wound. Edema Control 4 layer compression - Bilateral - first layer of unna boot to upper portion of lower leg to aid in securing the compression wraps. Avoid standing for long periods of time Elevate legs to the level of the heart or above for 30 minutes daily and/or when sitting, a frequency of: - 3-4 times a throughout the day. Exercise regularly Segmental Compressive Device. - Patient to use lymphedema pumps twice a day for an hour each time daily. Electronic  Signature(s) Signed: 03/15/2020 5:01:15 PM By: Linton Ham MD Signed: 03/15/2020 5:24:43 PM By: Deon Pilling Entered By: Deon Pilling on 03/15/2020 13:40:26 -------------------------------------------------------------------------------- Problem List Details Patient Name: Date of Service: [name], [name] 03/15/2020 12:45 PM Medical Record Number: [id number] Patient Account Number: [id number] Date of Birth/Sex: Treating RN: 1946/02/22 (74 y.o. 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 chronic ulcer of other part of left lower leg with fat layer exposed10/11/2019 No Yes L97.812 Non-pressure chronic ulcer of other part of right lower leg with fat layer 02/23/2020 No Yes exposed Inactive Problems Resolved Problems Electronic Signature(s) Signed: 03/15/2020 5:01:15 PM By: Linton Ham MD Signed: 03/15/2020 5:24:43 PM By: Deon Pilling Entered By: Deon Pilling on 03/15/2020 13:21:55 -------------------------------------------------------------------------------- SuperBill Details Patient Name: Date of Service: [name], [name] 03/15/2020 Medical Record Number: [id number] Patient Account Number: [id number] Date of Birth/Sex: Treating RN: 25-Jun-1945 (74 y.o. 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

## 2020-03-21 ENCOUNTER — Other Ambulatory Visit (HOSPITAL_COMMUNITY): Payer: Self-pay | Admitting: Internal Medicine

## 2020-03-21 ENCOUNTER — Encounter (HOSPITAL_COMMUNITY): Payer: Medicare Other

## 2020-03-21 DIAGNOSIS — L97909 Non-pressure chronic ulcer of unspecified part of unspecified lower leg with unspecified severity: Secondary | ICD-10-CM

## 2020-03-22 ENCOUNTER — Other Ambulatory Visit: Payer: Self-pay

## 2020-03-22 ENCOUNTER — Ambulatory Visit (HOSPITAL_COMMUNITY)
Admission: RE | Admit: 2020-03-22 | Discharge: 2020-03-22 | Disposition: A | Discharge status: Home / Self Care | Payer: Medicare Other | Source: Ambulatory Visit | Attending: Internal Medicine | Admitting: Internal Medicine

## 2020-03-22 ENCOUNTER — Encounter (HOSPITAL_BASED_OUTPATIENT_CLINIC_OR_DEPARTMENT_OTHER): Payer: Medicare Other | Attending: Internal Medicine | Admitting: Internal Medicine

## 2020-03-22 DIAGNOSIS — I890 Lymphedema, not elsewhere classified: Secondary | ICD-10-CM | POA: Diagnosis not present

## 2020-03-22 DIAGNOSIS — I10 Essential (primary) hypertension: Secondary | ICD-10-CM | POA: Insufficient documentation

## 2020-03-22 DIAGNOSIS — L97909 Non-pressure chronic ulcer of unspecified part of unspecified lower leg with unspecified severity: Secondary | ICD-10-CM | POA: Diagnosis present

## 2020-03-22 DIAGNOSIS — L97822 Non-pressure chronic ulcer of other part of left lower leg with fat layer exposed: Secondary | ICD-10-CM | POA: Diagnosis present

## 2020-03-22 DIAGNOSIS — L97812 Non-pressure chronic ulcer of other part of right lower leg with fat layer exposed: Secondary | ICD-10-CM | POA: Diagnosis not present

## 2020-03-22 NOTE — Progress Notes (Signed)
Berke, NOSSON ([id number]) Visit Report for 03/22/2020 HPI Details Patient Name: Date of Service: Witherow, [name] 03/22/2020 12:45 PM Medical Record Number: [id number] Patient Account Number: [id number] Date of Birth/Sex: Treating RN: 10-30-1945 (74 y.o. [name] Primary Care Provider: [name] Other Clinician: Referring Provider: Treating Provider/Extender: Drucilla Schmidt Weeks in Treatment: 4 History of Present Illness HPI Description: ADMISSION 02/23/2020 Patient is a 74 year old man who lives in [place] who arrives accompanied by his wife. He has a history of chronic lymphedema and venous insufficiency in his bilateral lower legs which may have something to do that with having a history of DVT as well as being treated for prostate cancer. In any case he recently got compression pumps at home but compliance has been an issue here. He has compression stockings however they are probably not sufficient enough to control swelling. They tell us that things deteriorated for him in late August he was admitted to [facility] for 7 days. This was with cellulitis I think of his bilateral lower legs. Discharge he was noted to have wounds on his bilateral lower legs. He was discharged on Bactrim. They tried to get him home health through [facility] of course they declined him. His wife is been wrapping these applying some form of silver foam dressing. He has a history of wounds before although nothing that would not heal with basic home topical dressings. He has 2 areas on the left medial, left anterior and left lateral and a smaller area on the right medial. All of these have considerable depth. Past medical history includes iron deficiency anemia, lymphedema followed by the rehab center at [facility] with lymphedema wraps I believe, DVT on chronic anticoagulation, prostate cancer, chronic venous insufficiency, hypertension. As mentioned he has  compression pumps but does not use them. ABIs in our clinic were noncompressible bilaterally 10/14; patient with severe bilateral lymphedema right greater than left. He came in with bilateral lower extremity wounds left greater than right. Even though the right side has more of the edema most of the wounds here almost closed on the right medial. He has 3 remaining wounds on the left We have been using silver alginate under 4-layer compression I have been trying to get him to be compliant with his external compression pumps 10/21; patient with 3 small wounds on the left leg and 1 on the right medial in the setting of severe lymphedema and chronic venous insufficiency. We have been using silver alginate under 4-layer compression he is using his external compression pumps twice a day 11/4; ARTERIAL STUDIES on the right show an ABI of 1.02 TBI of 0.858 with biphasic waveforms on the left 0.98 with a TBI of 0.55 and biphasic waveforms. Does not look like he has significant arterial disease. We are treating him for lymphedema he has compression pumps. He has punched-out areas on the left anterior left lateral and right medial lower extremities Electronic Signature(s) Signed: 03/22/2020 5:37:49 PM By: Linton Ham MD Entered By: Linton Ham on 03/22/2020 15:03:43 -------------------------------------------------------------------------------- Physical Exam Details Patient Name: Date of Service: Colton, Carey 03/22/2020 12:45 PM Medical Record Number: [id number] Patient Account Number: [id number] Date of Birth/Sex: Treating RN: [birth year] (74 y.o. [name] Primary Care Provider: [name] Other Clinician: Referring Provider: Treating Provider/Extender: Drucilla Schmidt Weeks in Treatment: 4 Constitutional Patient is hypertensive.. Pulse regular and within target range for patient.Marland Kitchen Respirations regular, non-labored and within target range.. Temperature  is normal  and within the target range for the patient.Marland Kitchen Appears in no distress. Cardiovascular Swelling in the both legs is improved. Notes Wound exam; he has 3 punched-out areas 1 on the left anterior 1 on the left lateral and the other on the right medial. The area on the right medial I think is slightly better. Surfaces of all of these wounds not the best but not too bad. No mechanical debridement was done. Electronic Signature(s) Signed: 03/22/2020 5:37:49 PM By: Linton Ham MD Entered By: Linton Ham on 03/22/2020 15:04:40 -------------------------------------------------------------------------------- Physician Orders Details Patient Name: Date of Service: Wille, Omega 03/22/2020 12:45 PM Medical Record Number: [id number] Patient Account Number: [id number] Date of Birth/Sex: Treating RN: 04/17/1946 (74 y.o. [name], [name] Primary Care Provider: [name] Other Clinician: Referring Provider: Treating Provider/Extender: Drucilla Schmidt Weeks in Treatment: 4 Verbal / Phone Orders: No Diagnosis Coding ICD-10 Coding Code Description I89.0 Lymphedema, not elsewhere classified L97.822 Non-pressure chronic ulcer of other part of left lower leg with fat layer exposed L97.812 Non-pressure chronic ulcer of other part of right lower leg with fat layer exposed Follow-up Appointments ppointment in 1 week. - thursday Return A Dressing Change Frequency Do not change entire dressing for one week. Skin Barriers/Peri-Wound Care TCA Cream or Ointment - mixed with lotion both legs. Wound Cleansing May shower with protection. - use a cast protector, Primary Wound Dressing Wound #1 Right,Medial Lower Leg Silver Collagen - moisten with hydrogel. Wound #2 Left,Lateral Lower Leg Silver Collagen - moisten with hydrogel. Wound #3 Left,Anterior Lower Leg Silver Collagen - moisten with hydrogel. Wound #4 Left,Proximal,Medial Lower Leg Silver Collagen - moisten with  hydrogel. Secondary Dressing Dry Gauze ABD pad Drawtex - may use to feel in spaces of the wound. Edema Control 4 layer compression - Bilateral - first layer of unna boot to upper portion of lower leg to aid in securing the compression wraps. Avoid standing for long periods of time Elevate legs to the level of the heart or above for 30 minutes daily and/or when sitting, a frequency of: - 3-4 times a throughout the day. Exercise regularly Segmental Compressive Device. - ***Patient to use lymphedema pumps twice a day for an hour each time daily.*** Electronic Signature(s) Signed: 03/22/2020 5:37:49 PM By: Linton Ham MD Signed: 03/22/2020 6:08:00 PM By: Deon Pilling Signed: 03/22/2020 6:08:00 PM By: Deon Pilling Entered By: Deon Pilling on 03/22/2020 13:54:12 -------------------------------------------------------------------------------- Problem List Details Patient Name: Date of Service: [place], [place] 03/22/2020 12:45 PM Medical Record Number: [id number] Patient Account Number: [id number] Date of Birth/Sex: Treating RN: 08-28-1945 (74 y.o. [name], [name] Primary Care Provider: [name] Other Clinician: Referring Provider: Treating Provider/Extender: Drucilla Schmidt Weeks in Treatment: 4 Active Problems ICD-10 Encounter Code Description Active Date MDM Diagnosis I89.0 Lymphedema, not elsewhere classified 02/23/2020 No Yes L97.822 Non-pressure chronic ulcer of other part of left lower leg with fat layer exposed10/11/2019 No Yes L97.812 Non-pressure chronic ulcer of other part of right lower leg with fat layer 02/23/2020 No Yes exposed Inactive Problems Resolved Problems Electronic Signature(s) Signed: 03/22/2020 5:37:49 PM By: Linton Ham MD Entered By: Linton Ham on 03/22/2020 15:02:17 -------------------------------------------------------------------------------- Progress Note Details Patient Name: Date of Service: [place]ord, [place]  03/22/2020 12:45 PM Medical Record Number: [id number] Patient Account Number: [id number] Date of Birth/Sex: Treating RN: 04-Sep-1945 (74 y.o. [name] Primary Care Provider: [name] Other Clinician: Referring Provider: Treating Provider/Extender: Drucilla Schmidt Weeks in Treatment:  4 Subjective History of Present Illness (HPI) ADMISSION 02/23/2020 Patient is a 74 year old man who lives in [place] who arrives accompanied by his wife. He has a history of chronic lymphedema and venous insufficiency in his bilateral lower legs which may have something to do that with having a history of DVT as well as being treated for prostate cancer. In any case he recently got compression pumps at home but compliance has been an issue here. He has compression stockings however they are probably not sufficient enough to control swelling. They tell us that things deteriorated for him in late August he was admitted to [facility] for 7 days. This was with cellulitis I think of his bilateral lower legs. Discharge he was noted to have wounds on his bilateral lower legs. He was discharged on Bactrim. They tried to get him home health through [facility] of course they declined him. His wife is been wrapping these applying some form of silver foam dressing. He has a history of wounds before although nothing that would not heal with basic home topical dressings. He has 2 areas on the left medial, left anterior and left lateral and a smaller area on the right medial. All of these have considerable depth. Past medical history includes iron deficiency anemia, lymphedema followed by the rehab center at [facility] with lymphedema wraps I believe, DVT on chronic anticoagulation, prostate cancer, chronic venous insufficiency, hypertension. As mentioned he has compression pumps but does not use them. ABIs in our clinic were noncompressible bilaterally 10/14; patient with  severe bilateral lymphedema right greater than left. He came in with bilateral lower extremity wounds left greater than right. Even though the right side has more of the edema most of the wounds here almost closed on the right medial. He has 3 remaining wounds on the left We have been using silver alginate under 4-layer compression I have been trying to get him to be compliant with his external compression pumps 10/21; patient with 3 small wounds on the left leg and 1 on the right medial in the setting of severe lymphedema and chronic venous insufficiency. We have been using silver alginate under 4-layer compression he is using his external compression pumps twice a day 11/4; ARTERIAL STUDIES on the right show an ABI of 1.02 TBI of 0.858 with biphasic waveforms on the left 0.98 with a TBI of 0.55 and biphasic waveforms. Does not look like he has significant arterial disease. We are treating him for lymphedema he has compression pumps. He has punched-out areas on the left anterior left lateral and right medial lower extremities Objective Constitutional Patient is hypertensive.. Pulse regular and within target range for patient.Marland Kitchen Respirations regular, non-labored and within target range.. Temperature is normal and within the target range for the patient.Marland Kitchen Appears in no distress. Vitals Time Taken: 1:10 PM, Temperature: 98.3 F, Pulse: 106 bpm, Respiratory Rate: 18 breaths/min, Blood Pressure: 189/72 mmHg. Cardiovascular Swelling in the both legs is improved. General Notes: Wound exam; he has 3 punched-out areas 1 on the left anterior 1 on the left lateral and the other on the right medial. The area on the right medial I think is slightly better. Surfaces of all of these wounds not the best but not too bad. No mechanical debridement was done. Integumentary (Hair, Skin) Wound #1 status is Open. Original cause of wound was Blister. The wound is located on the Right,Medial Lower Leg. The wound measures  0.5cm length x 0.7cm width x 0.8cm  depth; 0.275cm^2 area and 0.22cm^3 volume. There is Fat Layer (Subcutaneous Tissue) exposed. There is no tunneling or undermining noted. There is a medium amount of serous drainage noted. The wound margin is well defined and not attached to the wound base. There is medium (34-66%) pink granulation within the wound bed. There is a medium (34-66%) amount of necrotic tissue within the wound bed including Adherent Slough. Wound #2 status is Open. Original cause of wound was Blister. The wound is located on the Left,Lateral Lower Leg. The wound measures 2cm length x 1.5cm width x 1.3cm depth; 2.356cm^2 area and 3.063cm^3 volume. There is Fat Layer (Subcutaneous Tissue) exposed. There is no tunneling or undermining noted. There is a large amount of serosanguineous drainage noted. The wound margin is well defined and not attached to the wound base. There is medium (34-66%) red granulation within the wound bed. There is a medium (34-66%) amount of necrotic tissue within the wound bed including Adherent Slough. Wound #3 status is Open. Original cause of wound was Blister. The wound is located on the Left,Anterior Lower Leg. The wound measures 0.5cm length x 0.7cm width x 0.4cm depth; 0.275cm^2 area and 0.11cm^3 volume. There is Fat Layer (Subcutaneous Tissue) exposed. There is no tunneling or undermining noted. There is a medium amount of serosanguineous drainage noted. The wound margin is well defined and not attached to the wound base. There is large (67- 100%) red granulation within the wound bed. There is a small (1-33%) amount of necrotic tissue within the wound bed including Adherent Slough. Wound #4 status is Open. Original cause of wound was Blister. The wound is located on the Left,Proximal,Medial Lower Leg. The wound measures 1.8cm length x 1cm width x 0.3cm depth; 1.414cm^2 area and 0.424cm^3 volume. There is Fat Layer (Subcutaneous Tissue) exposed. There is no  tunneling or undermining noted. There is a medium amount of serous drainage noted. The wound margin is well defined and not attached to the wound base. There is medium (34-66%) pink granulation within the wound bed. There is a medium (34-66%) amount of necrotic tissue within the wound bed including Adherent Slough. Assessment Active Problems ICD-10 Lymphedema, not elsewhere classified Non-pressure chronic ulcer of other part of left lower leg with fat layer exposed Non-pressure chronic ulcer of other part of right lower leg with fat layer exposed Procedures Wound #1 Pre-procedure diagnosis of Wound #1 is a Lymphedema located on the Right,Medial Lower Leg . There was a Four Layer Compression Therapy Procedure by Levan Hurst, RN. Post procedure Diagnosis Wound #1: Same as Pre-Procedure Wound #2 Pre-procedure diagnosis of Wound #2 is a Lymphedema located on the Left,Lateral Lower Leg . There was a Four Layer Compression Therapy Procedure by Levan Hurst, RN. Post procedure Diagnosis Wound #2: Same as Pre-Procedure Wound #3 Pre-procedure diagnosis of Wound #3 is a Lymphedema located on the Left,Anterior Lower Leg . There was a Four Layer Compression Therapy Procedure by Levan Hurst, RN. Post procedure Diagnosis Wound #3: Same as Pre-Procedure Wound #4 Pre-procedure diagnosis of Wound #4 is a Lymphedema located on the Left,Proximal,Medial Lower Leg . There was a Four Layer Compression Therapy Procedure by Levan Hurst, RN. Post procedure Diagnosis Wound #4: Same as Pre-Procedure Plan Follow-up Appointments: Return Appointment in 1 week. - thursday Dressing Change Frequency: Do not change entire dressing for one week. Skin Barriers/Peri-Wound Care: TCA Cream or Ointment - mixed with lotion both legs. Wound Cleansing: May shower with protection. - use a cast protector, Primary Wound Dressing: Wound #1 Right,Medial  Lower Leg: Silver Collagen - moisten with hydrogel. Wound #2  Left,Lateral Lower Leg: Silver Collagen - moisten with hydrogel. Wound #3 Left,Anterior Lower Leg: Silver Collagen - moisten with hydrogel. Wound #4 Left,Proximal,Medial Lower Leg: Silver Collagen - moisten with hydrogel. Secondary Dressing: Dry Gauze ABD pad Drawtex - may use to feel in spaces of the wound. Edema Control: 4 layer compression - Bilateral - first layer of unna boot to upper portion of lower leg to aid in securing the compression wraps. Avoid standing for long periods of time Elevate legs to the level of the heart or above for 30 minutes daily and/or when sitting, a frequency of: - 3-4 times a throughout the day. Exercise regularly Segmental Compressive Device. - ***Patient to use lymphedema pumps twice a day for an hour each time daily.*** #1 Silver collagen to all wounds to continue. Continue with 4 layer compression. I have asked him to use this in compression pumps on top of this. If we can control the swelling then options for additional dressings are certainly present. Electronic Signature(s) Signed: 03/22/2020 5:37:49 PM By: Linton Ham MD Entered By: Linton Ham on 03/22/2020 15:05:52 -------------------------------------------------------------------------------- SuperBill Details Patient Name: Date of Service: Viney, [name] 03/22/2020 Medical Record Number: [id number] Patient Account Number: [id number] Date of Birth/Sex: Treating RN: 1945-12-15 (74 y.o. [name], [name] Primary Care Provider: [name] Other Clinician: Referring Provider: Treating Provider/Extender: Drucilla Schmidt Weeks in Treatment: 4 Diagnosis Coding ICD-10 Codes Code Description I89.0 Lymphedema, not elsewhere classified L97.822 Non-pressure chronic ulcer of other part of left lower leg with fat layer exposed L97.812 Non-pressure chronic ulcer of other part of right lower leg with fat layer exposed Facility Procedures CPT4: Code [id number]  fo Description: 165 BILATERAL: Application of multi-layer venous compression system; leg (below knee), including ankle and ot. Modifier: Quantity: 1 Electronic Signature(s) Signed: 03/22/2020 5:37:49 PM By: Linton Ham MD Signed: 03/22/2020 6:08:00 PM By: Deon Pilling Entered By: Deon Pilling on 03/22/2020 13:54:37

## 2020-03-22 NOTE — Progress Notes (Signed)
Takacs, TRUITT ([id number]) Visit Report for 03/22/2020 Arrival Information Details Patient Name: Date of Service: [name], [name] 03/22/2020 12:45 PM Medical Record Number: [id number] Patient Account Number: [id number] Date of Birth/Sex: Treating RN: 1946/02/01 (74 y.o. Janyth Contes Primary Care Suhaas Agena: [name] Other Clinician: Referring [name] Weisbecker: Treating Shaneika Rossa/Extender: Drucilla Schmidt Weeks in Treatment: 4 Visit Information History Since Last Visit Added or deleted any medications: No Patient Arrived: Ambulatory Any new allergies or adverse reactions: No Arrival Time: 13:09 Had a fall or experienced change in No Accompanied By: spouse activities of daily living that may affect Transfer Assistance: None risk of falls: Patient Identification Verified: Yes Signs or symptoms of abuse/neglect since last visito No Secondary Verification Process Completed: Yes Hospitalized since last visit: No Patient Requires Transmission-Based Precautions: No Implantable device outside of the clinic excluding No Patient Has Alerts: Yes cellular tissue based products placed in the center Patient Alerts: Patient on Blood Thinner since last visit: on warfarin Has Dressing in Place as Prescribed: No Bilateral ABI: [place] Has Compression in Place as Prescribed: No Pain Present Now: No Electronic Signature(s) Signed: 03/22/2020 5:54:11 PM By: Levan Hurst RN, [name] Entered By: Levan Hurst on 03/22/2020 13:10:25 -------------------------------------------------------------------------------- Compression Therapy Details Patient Name: Date of Service: [name], [name] 03/22/2020 12:45 PM Medical Record Number: [id number] Patient Account Number: [id number] Date of Birth/Sex: Treating RN: 1945-10-23 (74 y.o. [name] Primary Care Leisa Gault: [name] Other Clinician: Referring Anasha Perfecto: Treating Chara Marquard/Extender: Drucilla Schmidt Weeks in  Treatment: 4 Compression Therapy Performed for Wound Assessment: Wound #1 Right,Medial Lower Leg Performed By: Clinician Levan Hurst, RN Compression Type: Four Layer Post Procedure Diagnosis Same as Pre-procedure Electronic Signature(s) Signed: 03/22/2020 6:08:00 PM By: Deon Pilling Entered By: Deon Pilling on 03/22/2020 13:52:59 -------------------------------------------------------------------------------- Compression Therapy Details Patient Name: Date of Service: [name], [name] 03/22/2020 12:45 PM Medical Record Number: [id number] Patient Account Number: [id number] Date of Birth/Sex: Treating RN: 1946-01-23 (74 y.o. [name] Primary Care Vernida Mcnicholas: [name] Other Clinician: Referring Kelsee Preslar: Treating Matelyn Antonelli/Extender: Drucilla Schmidt Weeks in Treatment: 4 Compression Therapy Performed for Wound Assessment: Wound #2 Left,Lateral Lower Leg Performed By: Clinician Levan Hurst, RN Compression Type: Four Layer Post Procedure Diagnosis Same as Pre-procedure Electronic Signature(s) Signed: 03/22/2020 6:08:00 PM By: Deon Pilling Entered By: Deon Pilling on 03/22/2020 13:52:59 -------------------------------------------------------------------------------- Compression Therapy Details Patient Name: Date of Service: [name], [name] 03/22/2020 12:45 PM Medical Record Number: [id number] Patient Account Number: [id number] Date of Birth/Sex: Treating RN: February 21, 1946 (74 y.o. [name] Primary Care Jimie Kuwahara: [name] Other Clinician: Referring Dash Cardarelli: Treating Tyreque Finken/Extender: Drucilla Schmidt Weeks in Treatment: 4 Compression Therapy Performed for Wound Assessment: Wound #3 Left,Anterior Lower Leg Performed By: Clinician Levan Hurst, RN Compression Type: Four Layer Post Procedure Diagnosis Same as Pre-procedure Electronic Signature(s) Signed: 03/22/2020 6:08:00 PM By: Deon Pilling Entered By: Deon Pilling on 03/22/2020 13:53:00 -------------------------------------------------------------------------------- Compression Therapy Details Patient Name: Date of Service: [name], [name] 03/22/2020 12:45 PM Medical Record Number: [id number] Patient Account Number: [id number] Date of Birth/Sex: Treating RN: [birth year] (74 y.o. [name] Primary Care Rochester Serpe: [name] Other Clinician: Referring Meadow Abramo: Treating Emelie Newsom/Extender: Drucilla Schmidt Weeks in Treatment: 4 Compression Therapy Performed for Wound Assessment: Wound #4 Left,Proximal,Medial Lower Leg Performed By: Clinician Levan Hurst, RN Compression Type: Four Layer Post Procedure Diagnosis Same as Pre-procedure Electronic Signature(s) Signed: 03/22/2020 6:08:00 PM By: Deon Pilling Entered By: Deon Pilling on  03/22/2020 13:53:00 -------------------------------------------------------------------------------- Encounter Discharge Information Details Patient Name: Date of Service: [name], [name] 03/22/2020 12:45 PM Medical Record Number: [id number] Patient Account Number: [id number] Date of Birth/Sex: Treating RN: 15-Apr-1946 (74 y.o. Janyth Contes Primary Care Lenell Mcconnell: [name] Other Clinician: Referring Kalysta Kneisley: Treating Dex Blakely/Extender: Alycia Rossetti in Treatment: 4 Encounter Discharge Information Items Discharge Condition: Stable Ambulatory Status: Ambulatory Discharge Destination: Home Transportation: Private Auto Accompanied By: wife Schedule Follow-up Appointment: Yes Clinical Summary of Care: Patient Declined Electronic Signature(s) Signed: 03/22/2020 5:54:11 PM By: Levan Hurst RN, [name] Entered By: Levan Hurst on 03/22/2020 17:43:56 -------------------------------------------------------------------------------- Lower Extremity Assessment Details Patient Name: Date of Service: [name], [name] 03/22/2020 12:45 PM Medical  Record Number: [id number] Patient Account Number: [id number] Date of Birth/Sex: Treating RN: 17-Jul-1945 (74 y.o. Janyth Contes Primary Care Ixel Boehning: [name] Other Clinician: Referring Howie Rufus: Treating Damir Leung/Extender: Drucilla Schmidt Weeks in Treatment: 4 Edema Assessment Assessed: [name]: No] [name]: No] Edema: [Left: Yes] [Right: Yes] Calf Left: Right: Point of Measurement: 46 cm From Medial Instep 43.5 cm 50 cm Ankle Left: Right: Point of Measurement: 15 cm From Medial Instep 35.2 cm 36.4 cm Vascular Assessment Pulses: Dorsalis Pedis Palpable: [Left:Yes] [Right:Yes] Electronic Signature(s) Signed: 03/22/2020 5:54:11 PM By: Levan Hurst RN, [name] Entered By: Levan Hurst on 03/22/2020 13:25:44 -------------------------------------------------------------------------------- Multi Wound Chart Details Patient Name: Date of Service: [name], [name] 03/22/2020 12:45 PM Medical Record Number: [id number] Patient Account Number: [id number] Date of Birth/Sex: Treating RN: 13-Oct-1945 (74 y.o. [name], [name] Primary Care Mikolaj Woolstenhulme: [name] Other Clinician: Referring Hilja Kintzel: Treating Fusako Tanabe/Extender: Drucilla Schmidt Weeks in Treatment: 4 Vital Signs Height(in): Pulse(bpm): 106 Weight(lbs): Blood Pressure(mmHg): 189/72 Body Mass Index(BMI): Temperature(F): 98.3 Respiratory Rate(breaths/min): 18 Photos: [1:No Photos Right, Medial Lower Leg] [2:No Photos Left, Lateral Lower Leg] [3:No Photos Left, Anterior Lower Leg] Wound Location: [1:Blister] [2:Blister] [3:Blister] Wounding Event: [1:Lymphedema] [2:Lymphedema] [3:Lymphedema] Primary Etiology: [1:Lymphedema, Deep Vein Thrombosis, Lymphedema, Deep Vein Thrombosis, Lymphedema, Deep Vein Thrombosis,] Comorbid History: [1:Hypertension, Received Radiation 01/15/2020] [2:Hypertension, Received Radiation 01/15/2020] [3:Hypertension, Received Radiation 01/15/2020] Date  Acquired: [1:4] [2:4] [3:4] Weeks of Treatment: [1:Open] [2:Open] [3:Open] Wound Status: [1:0.5x0.7x0.8] [2:2x1.5x1.3] [3:0.5x0.7x0.4] Measurements L x W x D (cm) [1:0.275] [2:2.356] [3:0.275] A (cm) : rea [1:0.22] [2:3.063] [3:0.11] Volume (cm) : [1:65.00%] [2:31.80%] [3:79.50%] % Reduction in Area: [1:-178.50%] [2:40.90%] [3:93.20%] % Reduction in Volume: [1:Full Thickness Without Exposed] [2:Full Thickness Without Exposed] [3:Full Thickness Without Exposed] Classification: [1:Support Structures Medium] [2:Support Structures Large] [3:Support Structures Medium] Exudate Amount: [1:Serous] [2:Serosanguineous] [3:Serosanguineous] Exudate Type: [1:amber] [2:red, brown] [3:red, brown] Exudate Color: [1:Well defined, not attached] [2:Well defined, not attached] [3:Well defined, not attached] Wound Margin: [1:Medium (34-66%)] [2:Medium (34-66%)] [3:Large (67-100%)] Granulation Amount: [1:Pink] [2:Red] [3:Red] Granulation Quality: [1:Medium (34-66%)] [2:Medium (34-66%)] [3:Small (1-33%)] Necrotic Amount: [1:Fat Layer (Subcutaneous Tissue): Yes Fat Layer (Subcutaneous Tissue): Yes Fat Layer (Subcutaneous Tissue): Yes] Exposed Structures: [1:Fascia: No Tendon: No Muscle: No Joint: No Bone: No Medium (34-66%)] [2:Fascia: No Tendon: No Muscle: No Joint: No Bone: No Small (1-33%)] [3:Fascia: No Tendon: No Muscle: No Joint: No Bone: No Medium (34-66%)] Epithelialization: [1:Compression Therapy] [2:Compression Therapy] [3:Compression [place] Wound Number: 4 N/A N/A Photos: No Photos N/A N/A Left, Proximal, Medial Lower Leg N/A N/A Wound Location: Blister N/A N/A Wounding Event: Lymphedema N/A N/A Primary Etiology: Lymphedema, Deep Vein Thrombosis, N/A N/A Comorbid History: Hypertension, Received Radiation 01/15/2020 N/A N/A Date Acquired: 4 N/A N/A Weeks of Treatment: Open N/A N/A Wound Status: 1.8x1x0.3 N/A  N/A Measurements L x W x D (cm) 1.414 N/A N/A A (cm) : rea 0.424 N/A  N/A Volume (cm) : 43.70% N/A N/A % Reduction in Area: 81.30% N/A N/A % Reduction in Volume: Full Thickness Without Exposed N/A N/A Classification: Support Structures Medium N/A N/A Exudate Amount: Serous N/A N/A Exudate Type: amber N/A N/A Exudate Color: Well defined, not attached N/A N/A Wound Margin: Medium (34-66%) N/A N/A Granulation Amount: Pink N/A N/A Granulation Quality: Medium (34-66%) N/A N/A Necrotic Amount: Fat Layer (Subcutaneous Tissue): Yes N/A N/A Exposed Structures: Fascia: No Tendon: No Muscle: No Joint: No Bone: No Medium (34-66%) N/A N/A Epithelialization: Compression Therapy N/A N/A Procedures Performed: Treatment Notes Electronic Signature(s) Signed: 03/22/2020 5:37:49 PM By: Linton Ham MD Signed: 03/22/2020 6:08:00 PM By: Deon Pilling Entered By: Linton Ham on 03/22/2020 15:02:28 -------------------------------------------------------------------------------- Multi-Disciplinary Care Plan Details Patient Name: Date of Service: [name], [name] 03/22/2020 12:45 PM Medical Record Number: [id number] Patient Account Number: [id number] Date of Birth/Sex: Treating RN: [birth year] (74 y.o. [name], [name] Primary Care Cherlyn Syring: [name] Other Clinician: Referring Osker Ayoub: Treating Keyleigh Manninen/Extender: Drucilla Schmidt Weeks in Treatment: 4 Active Inactive Nutrition Nursing Diagnoses: Potential for alteratiion in Nutrition/Potential for imbalanced nutrition Goals: Patient/caregiver agrees to and verbalizes understanding of need to obtain nutritional consultation Date Initiated: 02/23/2020 Target Resolution Date: 04/20/2020 Goal Status: Active Interventions: Provide education on nutrition Treatment Activities: Education provided on Nutrition : 03/15/2020 Patient referred to Primary Care Physician for further nutritional evaluation : 02/23/2020 Notes: Pain, Acute or Chronic Nursing Diagnoses: Pain, acute or  chronic: actual or potential Potential alteration in comfort, pain Goals: Patient will verbalize adequate pain control and receive pain control interventions during procedures as needed Date Initiated: 02/23/2020 Target Resolution Date: 04/20/2020 Goal Status: Active Patient/caregiver will verbalize comfort level met Date Initiated: 02/23/2020 Date Inactivated: 03/22/2020 Target Resolution Date: 03/23/2020 Goal Status: Met Interventions: Provide education on pain management Reposition patient for comfort Notes: Wound/Skin Impairment Nursing Diagnoses: Knowledge deficit related to ulceration/compromised skin integrity Goals: Ulcer/skin breakdown will heal within 14 weeks Date Initiated: 02/23/2020 Target Resolution Date: 06/01/2020 Goal Status: Active Interventions: Assess patient/caregiver ability to obtain necessary supplies Assess patient/caregiver ability to perform ulcer/skin care regimen upon admission and as needed Provide education on ulcer and skin care Treatment Activities: Skin care regimen initiated : 02/23/2020 Topical wound management initiated : 02/23/2020 Notes: Electronic Signature(s) Signed: 03/22/2020 6:08:00 PM By: Deon Pilling Entered By: Deon Pilling on 03/22/2020 13:16:34 -------------------------------------------------------------------------------- Pain Assessment Details Patient Name: Date of Service: [name], [name] 03/22/2020 12:45 PM Medical Record Number: [id number] Patient Account Number: [id number] Date of Birth/Sex: Treating RN: 1945-09-06 (74 y.o. Janyth Contes Primary Care Delva Derden: [name] Other Clinician: Referring Markasia Carrol: Treating Koury Roddy/Extender: Drucilla Schmidt Weeks in Treatment: 4 Active Problems Location of Pain Severity and Description of Pain Patient Has Paino No Site Locations Pain Management and Medication Current Pain Management: Electronic Signature(s) Signed: 03/22/2020 5:54:11 PM By:  Levan Hurst RN, [name] Entered By: Levan Hurst on 03/22/2020 13:10:46 -------------------------------------------------------------------------------- Patient/Caregiver Education Details Patient Name: Date of Service: [name], [name] 11/4/2021andnbsp12:45 PM Medical Record Number: [id number] Patient Account Number: [id number] Date of Birth/Gender: Treating RN: 1946-03-24 (74 y.o. [name] Primary Care Physician: [name] Other Clinician: Referring Physician: Treating Physician/Extender: Alycia Rossetti in Treatment: 4 Education Assessment Education Provided To: Patient Education Topics Provided Pain: Handouts: A Guide to Pain Control Methods: Explain/Verbal Responses: Reinforcements needed Electronic Signature(s) Signed: 03/22/2020 6:08:00 PM  By: Deon Pilling Entered By: Deon Pilling on 03/22/2020 13:16:44 -------------------------------------------------------------------------------- Wound Assessment Details Patient Name: Date of Service: [name], [name] 03/22/2020 12:45 PM Medical Record Number: [id number] Patient Account Number: [id number] Date of Birth/Sex: Treating RN: [birth year] (74 y.o. Janyth Contes Primary Care Isadore Palecek: [name] Other Clinician: Referring Skylynne Schlechter: Treating Teria Khachatryan/Extender: Drucilla Schmidt Weeks in Treatment: 4 Wound Status Wound Number: 1 Primary Lymphedema Etiology: Wound Location: Right, Medial Lower Leg Wound Status: Open Wounding Event: Blister Comorbid Lymphedema, Deep Vein Thrombosis, Hypertension, Date Acquired: 01/15/2020 History: Received Radiation Weeks Of Treatment: 4 Clustered Wound: No Wound Measurements Length: (cm) 0.5 Width: (cm) 0.7 Depth: (cm) 0.8 Area: (cm) 0.275 Volume: (cm) 0.22 % Reduction in Area: 65% % Reduction in Volume: -178.5% Epithelialization: Medium (34-66%) Tunneling: No Undermining: No Wound Description Classification:  Full Thickness Without Exposed Support Structures Wound Margin: Well defined, not attached Exudate Amount: Medium Exudate Type: Serous Exudate Color: amber Foul Odor After Cleansing: No Slough/Fibrino Yes Wound Bed Granulation Amount: Medium (34-66%) Exposed Structure Granulation Quality: Pink Fascia Exposed: No Necrotic Amount: Medium (34-66%) Fat Layer (Subcutaneous Tissue) Exposed: Yes Necrotic Quality: Adherent Slough Tendon Exposed: No Muscle Exposed: No Joint Exposed: No Bone Exposed: No Treatment Notes Wound #1 (Right, Medial Lower Leg) 1. Cleanse With Soap and water 2. Periwound Care Moisturizing lotion TCA Cream 3. Primary Dressing Applied Collegen AG 4. Secondary Dressing ABD Pad Dry Gauze Drawtex 6. Support Layer Applied 4 layer compression Water quality scientist) Signed: 03/22/2020 5:54:11 PM By: Levan Hurst RN, [name] Entered By: Levan Hurst on 03/22/2020 13:23:48 -------------------------------------------------------------------------------- Wound Assessment Details Patient Name: Date of Service: [name], [name] 03/22/2020 12:45 PM Medical Record Number: [id number] Patient Account Number: [id number] Date of Birth/Sex: Treating RN: [birth year] (74 y.o. Janyth Contes Primary Care Dezeray Puccio: [name] Other Clinician: Referring Gregoria Selvy: Treating Aleane Wesenberg/Extender: Drucilla Schmidt Weeks in Treatment: 4 Wound Status Wound Number: 2 Primary Lymphedema Etiology: Wound Location: Left, Lateral Lower Leg Wound Status: Open Wounding Event: Blister Comorbid Lymphedema, Deep Vein Thrombosis, Hypertension, Date Acquired: 01/15/2020 History: Received Radiation Weeks Of Treatment: 4 Clustered Wound: No Wound Measurements Length: (cm) 2 Width: (cm) 1.5 Depth: (cm) 1.3 Area: (cm) 2.356 Volume: (cm) 3.063 % Reduction in Area: 31.8% % Reduction in Volume: 40.9% Epithelialization: Small (1-33%) Tunneling:  No Undermining: No Wound Description Classification: Full Thickness Without Exposed Support Structures Wound Margin: Well defined, not attached Exudate Amount: Large Exudate Type: Serosanguineous Exudate Color: red, brown Wound Bed Granulation Amount: Medium (34-66%) Granulation Quality: Red Necrotic Amount: Medium (34-66%) Necrotic Quality: Adherent Slough Foul Odor After Cleansing: No Slough/Fibrino Yes Exposed Structure Fascia Exposed: No Fat Layer (Subcutaneous Tissue) Exposed: Yes Tendon Exposed: No Muscle Exposed: No Joint Exposed: No Bone Exposed: No Treatment Notes Wound #2 (Left, Lateral Lower Leg) 1. Cleanse With Soap and water 2. Periwound Care Moisturizing lotion TCA Cream 3. Primary Dressing Applied Collegen [employer] 4. Secondary Dressing ABD Pad Dry Gauze Drawtex 6. Support Layer Applied 4 layer compression Water quality scientist) Signed: 03/22/2020 5:54:11 PM By: Levan Hurst RN, [name] Entered By: Levan Hurst on 03/22/2020 13:24:07 -------------------------------------------------------------------------------- Wound Assessment Details Patient Name: Date of Service: [name], [name] [place] 03/22/2020 12:45 PM Medical Record Number: [id number] Patient Account Number: [id number] Date of Birth/Sex: Treating RN: [birth year] (74 y.o. Janyth Contes Primary Care Joleena Weisenburger: [name] Other Clinician: Referring Tona Qualley: Treating Vannie Hilgert/Extender: Drucilla Schmidt Weeks in Treatment: 4 Wound Status Wound Number: 3 Primary Lymphedema Etiology: Wound Location: Left, Anterior  Lower Leg Wound Status: Open Wounding Event: Blister Comorbid Lymphedema, Deep Vein Thrombosis, Hypertension, Date Acquired: 01/15/2020 History: Received Radiation Weeks Of Treatment: 4 Clustered Wound: No Wound Measurements Length: (cm) 0.5 Width: (cm) 0.7 Depth: (cm) 0.4 Area: (cm) 0.275 Volume: (cm) 0.11 % Reduction in Area: 79.5% % Reduction in  Volume: 93.2% Epithelialization: Medium (34-66%) Tunneling: No Undermining: No Wound Description Classification: Full Thickness Without Exposed Support Structures Wound Margin: Well defined, not attached Exudate Amount: Medium Exudate Type: Serosanguineous Exudate Color: red, brown Foul Odor After Cleansing: No Slough/Fibrino Yes Wound Bed Granulation Amount: Large (67-100%) Exposed Structure Granulation Quality: Red Fascia Exposed: No Necrotic Amount: Small (1-33%) Fat Layer (Subcutaneous Tissue) Exposed: Yes Necrotic Quality: Adherent Slough Tendon Exposed: No Muscle Exposed: No Joint Exposed: No Bone Exposed: No Treatment Notes Wound #3 (Left, Anterior Lower Leg) 1. Cleanse With Soap and water 2. Periwound Care Moisturizing lotion TCA Cream 3. Primary Dressing Applied Collegen [employer] 4. Secondary Dressing ABD Pad Dry Gauze Drawtex 6. Support Layer Applied 4 layer compression Water quality scientist) Signed: 03/22/2020 5:54:11 PM By: Levan Hurst RN, [name] Entered By: Levan Hurst on 03/22/2020 13:24:31 -------------------------------------------------------------------------------- Wound Assessment Details Patient Name: Date of Service: [place], [place] 03/22/2020 12:45 PM Medical Record Number: [id number] Patient Account Number: [id number] Date of Birth/Sex: Treating RN: 01-Nov-1945 (74 y.o. Janyth Contes Primary Care Alpa Salvo: [name] Other Clinician: Referring Vihan Santagata: Treating Dereona Kolodny/Extender: Drucilla Schmidt Weeks in Treatment: 4 Wound Status Wound Number: 4 Primary Lymphedema Etiology: Wound Location: Left, Proximal, Medial Lower Leg Wound Status: Open Wounding Event: Blister Comorbid Lymphedema, Deep Vein Thrombosis, Hypertension, Date Acquired: 01/15/2020 History: Received Radiation Weeks Of Treatment: 4 Clustered Wound: No Wound Measurements Length: (cm) 1.8 Width: (cm) 1 Depth: (cm) 0.3 Area: (cm)  1.414 Volume: (cm) 0.424 % Reduction in Area: 43.7% % Reduction in Volume: 81.3% Epithelialization: Medium (34-66%) Tunneling: No Undermining: No Wound Description Classification: Full Thickness Without Exposed Support Structures Wound Margin: Well defined, not attached Exudate Amount: Medium Exudate Type: Serous Exudate Color: amber Foul Odor After Cleansing: No Slough/Fibrino Yes Wound Bed Granulation Amount: Medium (34-66%) Exposed Structure Granulation Quality: Pink Fascia Exposed: No Necrotic Amount: Medium (34-66%) Fat Layer (Subcutaneous Tissue) Exposed: Yes Necrotic Quality: Adherent Slough Tendon Exposed: No Muscle Exposed: No Joint Exposed: No Bone Exposed: No Treatment Notes Wound #4 (Left, Proximal, Medial Lower Leg) 1. Cleanse With Soap and water 2. Periwound Care Moisturizing lotion TCA Cream 3. Primary Dressing Applied Collegen [employer] 4. Secondary Dressing ABD Pad Dry Gauze Drawtex 6. Support Layer Applied 4 layer compression Water quality scientist) Signed: 03/22/2020 5:54:11 PM By: Levan Hurst RN, [name] Entered By: Levan Hurst on 03/22/2020 13:24:48 -------------------------------------------------------------------------------- Home Details Patient Name: Date of Service: [place], [place] 03/22/2020 12:45 PM Medical Record Number: [id number] Patient Account Number: [id number] Date of Birth/Sex: Treating RN: 12/06/1945 (74 y.o. Janyth Contes Primary Care Eriona Kinchen: [name] Other Clinician: Referring Janasia Coverdale: Treating Khaleah Duer/Extender: Drucilla Schmidt Weeks in Treatment: 4 Vital Signs Time Taken: 13:10 Temperature (F): 98.3 Pulse (bpm): 106 Respiratory Rate (breaths/min): 18 Blood Pressure (mmHg): 189/72 Reference Range: 80 - 120 mg / dl Electronic Signature(s) Signed: 03/22/2020 5:54:11 PM By: Levan Hurst RN, [name] Entered By: Levan Hurst on 03/22/2020 13:10:40

## 2020-03-29 ENCOUNTER — Other Ambulatory Visit: Payer: Self-pay

## 2020-03-29 ENCOUNTER — Encounter (HOSPITAL_BASED_OUTPATIENT_CLINIC_OR_DEPARTMENT_OTHER): Payer: Medicare Other | Admitting: Internal Medicine

## 2020-03-29 DIAGNOSIS — L97822 Non-pressure chronic ulcer of other part of left lower leg with fat layer exposed: Secondary | ICD-10-CM | POA: Diagnosis not present

## 2020-03-29 NOTE — Progress Notes (Signed)
[name], [name] ([id number]) Visit Report for 03/29/2020 HPI Details Patient Name: Date of Service: [name], [name] 03/29/2020 1:45 PM Medical Record Number: [id number] Patient Account Number: [id number] Date of Birth/Sex: Treating RN: [name], [name] (74 y.o. [name] Primary Care Provider: Birdie [name] Other Clinician: Referring Provider: Treating Provider/Extender: [name] Weeks in Treatment: 5 History of Present Illness HPI Description: ADMISSION 02/23/2020 Patient is a 74 year old man who lives in [place] who arrives accompanied by his wife. He has a history of chronic lymphedema and venous insufficiency in his bilateral lower legs which may have something to do that with having a history of DVT as well as being treated for prostate cancer. In any case he recently got compression pumps at home but compliance has been an issue here. He has compression stockings however they are probably not sufficient enough to control swelling. They tell us that things deteriorated for him in late August he was admitted to [name] for 7 days. This was with cellulitis I think of his bilateral lower legs. Discharge he was noted to have wounds on his bilateral lower legs. He was discharged on Bactrim. They tried to get him home health through [name] of course they declined him. His wife is been wrapping these applying some form of silver foam dressing. He has a history of wounds before although nothing that would not heal with basic home topical dressings. He has 2 areas on the left medial, left anterior and left lateral and a smaller area on the right medial. All of these have considerable depth. Past medical history includes iron deficiency anemia, lymphedema followed by the rehab center at [name] with lymphedema wraps I believe, DVT on chronic anticoagulation, prostate cancer, chronic venous insufficiency, hypertension. As mentioned he has  compression pumps but does not use them. ABIs in our clinic were noncompressible bilaterally 10/14; patient with severe bilateral lymphedema right greater than left. He came in with bilateral lower extremity wounds left greater than right. Even though the right side has more of the edema most of the wounds here almost closed on the right medial. He has 3 remaining wounds on the left We have been using silver alginate under 4-layer compression I have been trying to get him to be compliant with his external compression pumps 10/21; patient with 3 small wounds on the left leg and 1 on the right medial in the setting of severe lymphedema and chronic venous insufficiency. We have been using silver alginate under 4-layer compression he is using his external compression pumps twice a day 11/4; ARTERIAL STUDIES on the right show an ABI of 1.02 TBI of 0.858 with biphasic waveforms on the left 0.98 with a TBI of 0.55 and biphasic waveforms. Does not look like he has significant arterial disease. We are treating him for lymphedema he has compression pumps. He has punched-out areas on the left anterior left lateral and right medial lower extremities 11/11; after we obtained his arterial studies I put him in 4 layer compression. He is using his compression pumps probably once a day although I have asked him to do twice. Primary dressing to the wound is silver collagen he has severe lymphedema likely secondary to chronic venous insufficiency. Wounds on the left lateral, left medial and left anterior and a small area on the right medial Electronic Signature(s) Signed: 03/29/2020 5:00:15 PM By: [name] Ham MD Entered By: [name] on 03/29/2020 15:03:09 -------------------------------------------------------------------------------- Physical Exam Details Patient Name: Date  of Service: [name], [name] 03/29/2020 1:45 PM Medical Record Number: [id number] Patient Account Number: [id number] Date of  Birth/Sex: Treating RN: [birth year][name] (74 y.o. [name] Primary Care Provider: Birdie [name] Other Clinician: Referring Provider: Treating Provider/Extender: [name] Weeks in Treatment: 5 Constitutional Patient is hypertensive.. Pulse regular and within target range for patient.Marland Kitchen Respirations regular, non-labored and within target range.. Temperature is normal and within the target range for the patient.Marland Kitchen Appears in no distress. Cardiovascular Pedal pulses are palpable. Edema present in both extremities. The control is better this week but I think he can still be better if we could get the compression pump usage up. Notes Wound exam; he has 3 punched-out areas on the left one on the right medial. These all look better except for may be the area on the left lateral which still has some depth. Much better edema control. I have asked him to use the compression pumps twice a day. Electronic Signature(s) Signed: 03/29/2020 5:00:15 PM By: [name] Ham MD Entered By: [name] on 03/29/2020 15:04:18 -------------------------------------------------------------------------------- Physician Orders Details Patient Name: Date of Service: [name], [name] 03/29/2020 1:45 PM Medical Record Number: [id number] Patient Account Number: [id number] Date of Birth/Sex: Treating RN: [name] (74 y.o. [name] Primary Care Provider: Birdie [name] Other Clinician: Referring Provider: Treating Provider/Extender: [name] Weeks in Treatment: 5 Verbal / Phone Orders: No Diagnosis Coding ICD-10 Coding Code Description I89.0 Lymphedema, not elsewhere classified L97.822 Non-pressure chronic ulcer of other part of left lower leg with fat layer exposed L97.812 Non-pressure chronic ulcer of other part of right lower leg with fat layer exposed Follow-up Appointments Return appointment in 3 weeks. - Thursday 04/19/2020 Nurse Visit: -  Thursday 04/05/2020 Tuesday 04/10/2020 Dressing Change Frequency Do not change entire dressing for one week. Skin Barriers/Peri-Wound Care TCA Cream or Ointment - mixed with lotion both legs. Wound Cleansing May shower with protection. - use a cast protector, Primary Wound Dressing Wound #1 Right,Medial Lower Leg Silver Collagen - moisten with hydrogel. Wound #2 Left,Lateral Lower Leg Silver Collagen - moisten with hydrogel. Wound #3 Left,Anterior Lower Leg Silver Collagen - moisten with hydrogel. Wound #4 Left,Proximal,Medial Lower Leg Silver Collagen - moisten with hydrogel. Secondary Dressing Dry Gauze Drawtex - may use to fill in spaces of the wound. Edema Control 4 layer compression - Bilateral - first layer of unna boot to upper portion of lower leg to aid in securing the compression wraps. Avoid standing for long periods of time Elevate legs to the level of the heart or above for 30 minutes daily and/or when sitting, a frequency of: - 3-4 times a throughout the day. Exercise regularly Segmental Compressive Device. - ***Patient to use lymphedema pumps twice a day for an hour each time daily.*** Other: - the week of 04/19/2020 bring in compression wraps for MD to evaluate and if any wounds are healed. Electronic Signature(s) Signed: 03/29/2020 5:00:15 PM By: [name] Ham MD Signed: 03/29/2020 5:09:44 PM By: Deon Pilling Entered By: Deon Pilling on 03/29/2020 14:47:31 -------------------------------------------------------------------------------- Problem List Details Patient Name: Date of Service: Hedman, [name] 03/29/2020 1:45 PM Medical Record Number: [id number] Patient Account Number: [id number] Date of Birth/Sex: Treating RN: [birth year], [name] (74 y.o. [name] Primary Care Provider: Birdie [name] Other Clinician: Referring Provider: Treating Provider/Extender: [name] Weeks in Treatment: 5 Active Problems ICD-10 Encounter Code  Description Active Date MDM Diagnosis I89.0 Lymphedema, not elsewhere classified 02/23/2020 No Yes L97.822 Non-pressure  chronic ulcer of other part of left lower leg with fat layer exposed10/11/2019 No Yes L97.812 Non-pressure chronic ulcer of other part of right lower leg with fat layer 02/23/2020 No Yes exposed Inactive Problems Resolved Problems Electronic Signature(s) Signed: 03/29/2020 5:00:15 PM By: [name] Ham MD Entered By: [name] on 03/29/2020 14:59:[name] -------------------------------------------------------------------------------- Progress Note Details Patient Name: Date of Service: Stokke, [name] 03/29/2020 1:45 PM Medical Record Number: [id number] Patient Account Number: [id number] Date of Birth/Sex: Treating RN: [birth year][name] (74 y.o. [name] Primary Care Provider: Birdie [name] Other Clinician: Referring Provider: Treating Provider/Extender: [name] Weeks in Treatment: 5 Subjective History of Present Illness (HPI) ADMISSION 02/23/2020 Patient is a 74 year old man who lives in [place] who arrives accompanied by his wife. He has a history of chronic lymphedema and venous insufficiency in his bilateral lower legs which may have something to do that with having a history of DVT as well as being treated for prostate cancer. In any case he recently got compression pumps at home but compliance has been an issue here. He has compression stockings however they are probably not sufficient enough to control swelling. They tell us that things deteriorated for him in late August he was admitted to [facility] for 7 days. This was with cellulitis I think of his bilateral lower legs. Discharge he was noted to have wounds on his bilateral lower legs. He was discharged on Bactrim. They tried to get him home health through [facility] of course they declined him. His wife is been wrapping these applying some form of  silver foam dressing. He has a history of wounds before although nothing that would not heal with basic home topical dressings. He has 2 areas on the left medial, left anterior and left lateral and a smaller area on the right medial. All of these have considerable depth. Past medical history includes iron deficiency anemia, lymphedema followed by the rehab center at [facility] with lymphedema wraps I believe, DVT on chronic anticoagulation, prostate cancer, chronic venous insufficiency, hypertension. As mentioned he has compression pumps but does not use them. ABIs in our clinic were noncompressible bilaterally 10/14; patient with severe bilateral lymphedema right greater than left. He came in with bilateral lower extremity wounds left greater than right. Even though the right side has more of the edema most of the wounds here almost closed on the right medial. He has 3 remaining wounds on the left We have been using silver alginate under 4-layer compression I have been trying to get him to be compliant with his external compression pumps 10/21; patient with 3 small wounds on the left leg and 1 on the right medial in the setting of severe lymphedema and chronic venous insufficiency. We have been using silver alginate under 4-layer compression he is using his external compression pumps twice a day 11/4; ARTERIAL STUDIES on the right show an ABI of 1.02 TBI of 0.858 with biphasic waveforms on the left 0.98 with a TBI of 0.55 and biphasic waveforms. Does not look like he has significant arterial disease. We are treating him for lymphedema he has compression pumps. He has punched-out areas on the left anterior left lateral and right medial lower extremities 11/11; after we obtained his arterial studies I put him in 4 layer compression. He is using his compression pumps probably once a day although I have asked him to do twice. Primary dressing to the wound is silver collagen he has severe  lymphedema  likely secondary to chronic venous insufficiency. Wounds on the left lateral, left medial and left anterior and a small area on the right medial Objective Constitutional Patient is hypertensive.. Pulse regular and within target range for patient.Marland Kitchen Respirations regular, non-labored and within target range.. Temperature is normal and within the target range for the patient.Marland Kitchen Appears in no distress. Vitals Time Taken: 1:49 PM, Temperature: 98.4 F, Pulse: 94 bpm, Respiratory Rate: 18 breaths/min, Blood Pressure: 170/72 mmHg. Cardiovascular Pedal pulses are palpable. Edema present in both extremities. The control is better this week but I think he can still be better if we could get the compression pump usage up. General Notes: Wound exam; he has 3 punched-out areas on the left one on the right medial. These all look better except for may be the area on the left lateral which still has some depth. Much better edema control. I have asked him to use the compression pumps twice a day. Integumentary (Hair, Skin) Wound #1 status is Open. Original cause of wound was Blister. The wound is located on the Right,Medial Lower Leg. The wound measures 0.3cm length x 0.6cm width x 0.7cm depth; 0.141cm^2 area and 0.099cm^3 volume. There is Fat Layer (Subcutaneous Tissue) exposed. There is no tunneling or undermining noted. There is a medium amount of serous drainage noted. The wound margin is well defined and not attached to the wound base. There is medium (34-66%) pink granulation within the wound bed. There is a medium (34-66%) amount of necrotic tissue within the wound bed including Adherent Slough. Wound #2 status is Open. Original cause of wound was Blister. The wound is located on the Left,Lateral Lower Leg. The wound measures 1.7cm length x 0.7cm width x 1cm depth; 0.935cm^2 area and 0.935cm^3 volume. There is Fat Layer (Subcutaneous Tissue) exposed. There is no tunneling or undermining noted. There is a  large amount of serosanguineous drainage noted. The wound margin is well defined and not attached to the wound base. There is medium (34-66%) red granulation within the wound bed. There is a medium (34-66%) amount of necrotic tissue within the wound bed including Adherent Slough. Wound #3 status is Open. Original cause of wound was Blister. The wound is located on the Left,Anterior Lower Leg. The wound measures 0.3cm length x 0.3cm width x 0.2cm depth; 0.071cm^2 area and 0.014cm^3 volume. There is Fat Layer (Subcutaneous Tissue) exposed. There is no tunneling or undermining noted. There is a medium amount of serosanguineous drainage noted. The wound margin is well defined and not attached to the wound base. There is large (67- 100%) red granulation within the wound bed. There is a small (1-33%) amount of necrotic tissue within the wound bed including Adherent Slough. Wound #4 status is Open. Original cause of wound was Blister. The wound is located on the Left,Proximal,Medial Lower Leg. The wound measures 0.6cm length x 0.5cm width x 0.1cm depth; 0.236cm^2 area and 0.024cm^3 volume. There is Fat Layer (Subcutaneous Tissue) exposed. There is no tunneling or undermining noted. There is a medium amount of serous drainage noted. The wound margin is well defined and not attached to the wound base. There is medium (34-66%) pink granulation within the wound bed. There is a medium (34-66%) amount of necrotic tissue within the wound bed including Adherent Slough. Assessment Active Problems ICD-10 Lymphedema, not elsewhere classified Non-pressure chronic ulcer of other part of left lower leg with fat layer exposed Non-pressure chronic ulcer of other part of right lower leg with fat layer exposed Procedures Wound #  1 Pre-procedure diagnosis of Wound #1 is a Lymphedema located on the Right,Medial Lower Leg . There was a Four Layer Compression Therapy Procedure by Baruch Gouty, RN. Post procedure Diagnosis  Wound #1: Same as Pre-Procedure Wound #2 Pre-procedure diagnosis of Wound #2 is a Lymphedema located on the Left,Lateral Lower Leg . There was a Four Layer Compression Therapy Procedure by Baruch Gouty, RN. Post procedure Diagnosis Wound #2: Same as Pre-Procedure Wound #3 Pre-procedure diagnosis of Wound #3 is a Lymphedema located on the Left,Anterior Lower Leg . There was a Four Layer Compression Therapy Procedure by Baruch Gouty, RN. Post procedure Diagnosis Wound #3: Same as Pre-Procedure Wound #4 Pre-procedure diagnosis of Wound #4 is a Lymphedema located on the Left,Proximal,Medial Lower Leg . There was a Four Layer Compression Therapy Procedure by Baruch Gouty, RN. Post procedure Diagnosis Wound #4: Same as Pre-Procedure Plan Follow-up Appointments: Return appointment in 3 weeks. - Thursday 04/19/2020 Nurse Visit: - Thursday 04/05/2020 Tuesday 04/10/2020 Dressing Change Frequency: Do not change entire dressing for one week. Skin Barriers/Peri-Wound Care: TCA Cream or Ointment - mixed with lotion both legs. Wound Cleansing: May shower with protection. - use a cast protector, Primary Wound Dressing: Wound #1 Right,Medial Lower Leg: Silver Collagen - moisten with hydrogel. Wound #2 Left,Lateral Lower Leg: Silver Collagen - moisten with hydrogel. Wound #3 Left,Anterior Lower Leg: Silver Collagen - moisten with hydrogel. Wound #4 Left,Proximal,Medial Lower Leg: Silver Collagen - moisten with hydrogel. Secondary Dressing: Dry Gauze Drawtex - may use to fill in spaces of the wound. Edema Control: 4 layer compression - Bilateral - first layer of unna boot to upper portion of lower leg to aid in securing the compression wraps. Avoid standing for long periods of time Elevate legs to the level of the heart or above for 30 minutes daily and/or when sitting, a frequency of: - 3-4 times a throughout the day. Exercise regularly Segmental Compressive Device. - ***Patient to use  lymphedema pumps twice a day for an hour each time daily.*** Other: - the week of 04/19/2020 bring in compression wraps for MD to evaluate and if any wounds are healed. 1. Still using silver collagen under 4-layer compression 2. I have asked him to increase his compression pump usage to twice a day 3. He has lymphedema wraps I think a hold over from the lymphedema specialist we had in town. He is applying this himself I cannot imagine that is an easy thing to do. Maybe these are some form of compression garment all need to have them bring this in so we can better understand this Electronic Signature(s) Signed: 03/29/2020 5:00:15 PM By: [name] Ham MD Entered By: [name] on 03/29/2020 15:05:24 -------------------------------------------------------------------------------- SuperBill Details Patient Name: Date of Service: [place], [place] 03/29/2020 Medical Record Number: [id number] Patient Account Number: [id number] Date of Birth/Sex: Treating RN: [birth year], [name] (74 y.o. [name], [name] Primary Care Provider: Birdie [name] Other Clinician: Referring Provider: Treating Provider/Extender: [name] Weeks in Treatment: 5 Diagnosis Coding ICD-10 Codes Code Description I89.0 Lymphedema, not elsewhere classified L97.822 Non-pressure chronic ulcer of other part of left lower leg with fat layer exposed L97.812 Non-pressure chronic ulcer of other part of right lower leg with fat layer exposed Facility Procedures CPT4: Code [id number] Description: 81 BILATERAL: Application of multi-layer venous compression system; leg (below knee), including ankle and t. Modifier: Quantity: 1 Physician Procedures : CPT4 Code Description Modifier [id number] - WC PHYS LEVEL 3 - EST PT ICD-10 Diagnosis Description I89.0 Lymphedema,  not elsewhere classified L97.822 Non-pressure chronic ulcer of other part of left lower leg with fat layer exposed L97.812  Non-pressure  chronic ulcer of other part of right lower leg with fat layer exposed Quantity: 1 Electronic Signature(s) Signed: 03/29/2020 5:00:15 PM By: [name] Ham MD Entered By: [name] on 03/29/2020 15:05:48

## 2020-04-03 NOTE — Progress Notes (Signed)
[name], [name] ([id number]) Visit Report for 03/29/2020 Arrival Information Details Patient Name: Date of Service: [name], [name] 03/29/2020 1:45 PM Medical Record Number: [id number] Patient Account Number: [id number] Date of Birth/Sex: Treating RN: Aug 10, 1945 (74 y.o. [name] Primary Care Praise Stennett: [name] Other Clinician: Referring Haydyn Liddell: Treating Hadleigh Felber/Extender: Drucilla Schmidt Weeks in Treatment: 5 Visit Information History Since Last Visit Added or deleted any medications: No Patient Arrived: Ambulatory Any new allergies or adverse reactions: No Arrival Time: 13:48 Had a fall or experienced change in No Accompanied By: wife activities of daily living that may affect Transfer Assistance: None risk of falls: Patient Identification Verified: Yes Signs or symptoms of abuse/neglect since last visito No Secondary Verification Process Completed: Yes Hospitalized since last visit: No Patient Requires Transmission-Based Precautions: No Implantable device outside of the clinic excluding No Patient Has Alerts: Yes cellular tissue based products placed in the center Patient Alerts: Patient on Blood Thinner since last visit: on warfarin Has Dressing in Place as Prescribed: Yes Bilateral ABI: [place] Pain Present Now: Yes Electronic Signature(s) Signed: 03/30/2020 10:09:56 AM By: Sandre Kitty Entered By: Sandre Kitty on 03/29/2020 13:49:04 -------------------------------------------------------------------------------- Compression Therapy Details Patient Name: Date of Service: [name], [name] 03/29/2020 1:45 PM Medical Record Number: [id number] Patient Account Number: [id number] Date of Birth/Sex: Treating RN: 07-21-1945 (74 y.o. [name] Primary Care Izel Hochberg: [name] Other Clinician: Referring Dakota Vanwart: Treating Cerys Winget/Extender: Drucilla Schmidt Weeks in Treatment: 5 Compression Therapy Performed  for Wound Assessment: Wound #1 Right,Medial Lower Leg Performed By: Clinician Baruch Gouty, RN Compression Type: Four Layer Post Procedure Diagnosis Same as Pre-procedure Electronic Signature(s) Signed: 03/29/2020 5:09:44 PM By: Deon Pilling Entered By: Deon Pilling on 03/29/2020 14:36:36 -------------------------------------------------------------------------------- Compression Therapy Details Patient Name: Date of Service: [place], [name] 03/29/2020 1:45 PM Medical Record Number: [id number] Patient Account Number: [id number] Date of Birth/Sex: Treating RN: 1945/10/21 (74 y.o. [name] Primary Care Deziyah Arvin: [name] Other Clinician: Referring Betsaida [place]: Treating Devone Bonilla/Extender: Drucilla Schmidt Weeks in Treatment: 5 Compression Therapy Performed for Wound Assessment: Wound #3 Left,Anterior Lower Leg Performed By: Clinician Baruch Gouty, RN Compression Type: Four Layer Post Procedure Diagnosis Same as Pre-procedure Electronic Signature(s) Signed: 03/29/2020 5:09:44 PM By: Deon Pilling Entered By: Deon Pilling on 03/29/2020 14:36:36 -------------------------------------------------------------------------------- Compression Therapy Details Patient Name: Date of Service: [name], [name] 03/29/2020 1:45 PM Medical Record Number: [id number] Patient Account Number: [id number] Date of Birth/Sex: Treating RN: 1946/01/25 (74 y.o. [name] Primary Care [place] Sarinana: [name] Other Clinician: Referring Roselynn Whitacre: Treating Niyla Marone/Extender: Drucilla Schmidt Weeks in Treatment: 5 Compression Therapy Performed for Wound Assessment: Wound #4 Left,Proximal,Medial Lower Leg Performed By: Clinician Baruch Gouty, RN Compression Type: Four Layer Post Procedure Diagnosis Same as Pre-procedure Electronic Signature(s) Signed: 03/29/2020 5:09:44 PM By: Deon Pilling Entered By: Deon Pilling on 03/29/2020  14:36:36 -------------------------------------------------------------------------------- Compression Therapy Details Patient Name: Date of Service: [name], [name] 03/29/2020 1:45 PM Medical Record Number: [id number] Patient Account Number: [id number] Date of Birth/Sex: Treating RN: 1946-03-25 (74 y.o. [name] Primary Care Zerenity Bowron: [name] Other Clinician: Referring Lindzee Gouge: Treating Jourdin Connors/Extender: Drucilla Schmidt Weeks in Treatment: 5 Compression Therapy Performed for Wound Assessment: Wound #2 Left,Lateral Lower Leg Performed By: Clinician Baruch Gouty, RN Compression Type: Four Layer Post Procedure Diagnosis Same as Pre-procedure Electronic Signature(s) Signed: 03/29/2020 5:09:44 PM By: Deon Pilling Signed: 03/29/2020 5:09:44 PM By: Deon Pilling Entered By: Deon Pilling on 03/29/2020 14:36:37 --------------------------------------------------------------------------------  Encounter Discharge Information Details Patient Name: Date of Service: [name], [name] 03/29/2020 1:45 PM Medical Record Number: [id number] Patient Account Number: [id number] Date of Birth/Sex: Treating RN: 02/03/1946 (74 y.o. [name] Primary Care Ebbie Sorenson: [name] Other Clinician: Referring Karianna Gusman: Treating Nalea Salce/Extender: Alycia Rossetti in Treatment: 5 Encounter Discharge Information Items Discharge Condition: Stable Ambulatory Status: Ambulatory Discharge Destination: Home Transportation: Private Auto Accompanied By: spouse Schedule Follow-up Appointment: Yes Clinical Summary of Care: Patient Declined Electronic Signature(s) Signed: 03/29/2020 5:21:57 PM By: Baruch Gouty RN, [name] Entered By: Baruch Gouty on 03/29/2020 17:09:34 -------------------------------------------------------------------------------- Lower Extremity Assessment Details Patient Name: Date of Service: [name], [name]  03/29/2020 1:45 PM Medical Record Number: [id number] Patient Account Number: [id number] Date of Birth/Sex: Treating RN: Apr 04, 1946 (74 y.o. Janyth Contes Primary Care Phyillis Dascoli: [name] Other Clinician: Referring Evlyn Amason: Treating Raeden Schippers/Extender: Drucilla Schmidt Weeks in Treatment: 5 Edema Assessment Assessed: [name]: No] [Right: No] Edema: [Left: Yes] [Right: Yes] Calf Left: Right: Point of Measurement: 46 cm From Medial Instep 47 cm 47 cm Ankle Left: Right: Point of Measurement: 15 cm From Medial Instep 33 cm 35 cm Vascular Assessment Pulses: Dorsalis Pedis Palpable: [Left:Yes] Electronic Signature(s) Signed: 04/02/2020 5:51:53 PM By: Levan Hurst RN, [name] Entered By: Levan Hurst on 03/29/2020 14:17:44 -------------------------------------------------------------------------------- Multi Wound Chart Details Patient Name: Date of Service: [name], [name] 03/29/2020 1:45 PM Medical Record Number: [id number] Patient Account Number: [id number] Date of Birth/Sex: Treating RN: [birth year] (74 y.o. [name], [name] Primary Care Arthurine Oleary: [name] Other Clinician: Referring Zaakirah Kistner: Treating Dario Yono/Extender: Drucilla Schmidt Weeks in Treatment: 5 Vital Signs Height(in): Pulse(bpm): 67 Weight(lbs): Blood Pressure(mmHg): 170/72 Body Mass Index(BMI): Temperature(F): 98.4 Respiratory Rate(breaths/min): 18 Photos: [1:No Photos Right, Medial Lower Leg] [2:No Photos Left, Lateral Lower Leg] [3:No Photos Left, Anterior Lower Leg] Wound Location: [1:Blister] [2:Blister] [3:Blister] Wounding Event: [1:Lymphedema] [2:Lymphedema] [3:Lymphedema] Primary Etiology: [1:Lymphedema, Deep Vein Thrombosis, Lymphedema, Deep Vein Thrombosis, Lymphedema, Deep Vein Thrombosis,] Comorbid History: [1:Hypertension, Received Radiation 01/15/2020] [2:Hypertension, Received Radiation 01/15/2020] [3:Hypertension, Received Radiation  01/15/2020] Date Acquired: [1:5] [2:5] [3:5] Weeks of Treatment: [1:Open] [2:Open] [3:Open] Wound Status: [1:0.3x0.6x0.7] [2:1.7x0.7x1] [3:0.3x0.3x0.2] Measurements L x W x D (cm) [1:0.141] [2:0.935] [3:0.071] A (cm) : rea [1:0.099] [2:0.935] [3:0.014] Volume (cm) : [1:82.00%] [2:72.90%] [3:94.70%] % Reduction in Area: [1:-25.30%] [2:82.00%] [3:99.10%] % Reduction in Volume: [1:Full Thickness Without Exposed] [2:Full Thickness Without Exposed] [3:Full Thickness Without Exposed] Classification: [1:Support Structures Medium] [2:Support Structures Large] [3:Support Structures Medium] Exudate Amount: [1:Serous] [2:Serosanguineous] [3:Serosanguineous] Exudate Type: [1:amber] [2:red, brown] [3:red, brown] Exudate Color: [1:Well defined, not attached] [2:Well defined, not attached] [3:Well defined, not attached] Wound Margin: [1:Medium (34-66%)] [2:Medium (34-66%)] [3:Large (67-100%)] Granulation Amount: [1:Pink] [2:Red] [3:Red] Granulation Quality: [1:Medium (34-66%)] [2:Medium (34-66%)] [3:Small (1-33%)] Necrotic Amount: [1:Fat Layer (Subcutaneous Tissue): Yes Fat Layer (Subcutaneous Tissue): Yes Fat Layer (Subcutaneous Tissue): Yes] Exposed Structures: [1:Fascia: No Tendon: No Muscle: No Joint: No Bone: No Medium (34-66%)] [2:Fascia: No Tendon: No Muscle: No Joint: No Bone: No Small (1-33%)] [3:Fascia: No Tendon: No Muscle: No Joint: No Bone: No Medium (34-66%)] Epithelialization: [1:Compression Therapy] [2:Compression Therapy] [3:Compression [place] Wound Number: 4 N/A N/A Photos: No Photos N/A N/A Left, Proximal, Medial Lower Leg N/A N/A Wound Location: Blister N/A N/A Wounding Event: Lymphedema N/A N/A Primary Etiology: Lymphedema, Deep Vein Thrombosis, N/A N/A Comorbid History: Hypertension, Received Radiation 01/15/2020 N/A N/A Date Acquired: 5 N/A N/A Weeks of Treatment: Open N/A N/A Wound Status: 0.6x0.5x0.1 N/A N/A Measurements L x  W x D (cm) 0.236 N/A N/A A (cm)  : rea 0.024 N/A N/A Volume (cm) : 90.60% N/A N/A % Reduction in Area: 98.90% N/A N/A % Reduction in Volume: Full Thickness Without Exposed N/A N/A Classification: Support Structures Medium N/A N/A Exudate Amount: Serous N/A N/A Exudate Type: amber N/A N/A Exudate Color: Well defined, not attached N/A N/A Wound Margin: Medium (34-66%) N/A N/A Granulation Amount: Pink N/A N/A Granulation Quality: Medium (34-66%) N/A N/A Necrotic Amount: Fat Layer (Subcutaneous Tissue): Yes N/A N/A Exposed Structures: Fascia: No Tendon: No Muscle: No Joint: No Bone: No Medium (34-66%) N/A N/A Epithelialization: Compression Therapy N/A N/A Procedures Performed: Treatment Notes Electronic Signature(s) Signed: 03/29/2020 5:00:15 PM By: Linton Ham MD Signed: 03/29/2020 5:09:44 PM By: Deon Pilling Entered By: Linton Ham on 03/29/2020 15:02:15 -------------------------------------------------------------------------------- Multi-Disciplinary Care Plan Details Patient Name: Date of Service: [name], [name] 03/29/2020 1:45 PM Medical Record Number: [id number] Patient Account Number: [id number] Date of Birth/Sex: Treating RN: [birth year] (74 y.o. [name], [name] Primary Care Malene Blaydes: [name] Other Clinician: Referring Caty Tessler: Treating Caidan Hubbert/Extender: Drucilla Schmidt Weeks in Treatment: 5 Active Inactive Nutrition Nursing Diagnoses: Potential for alteratiion in Nutrition/Potential for imbalanced nutrition Goals: Patient/caregiver agrees to and verbalizes understanding of need to obtain nutritional consultation Date Initiated: 02/23/2020 Target Resolution Date: 04/20/2020 Goal Status: Active Interventions: Provide education on nutrition Treatment Activities: Education provided on Nutrition : 03/08/2020 Patient referred to Primary Care Physician for further nutritional evaluation : 02/23/2020 Notes: Pain, Acute or Chronic Nursing  Diagnoses: Pain, acute or chronic: actual or potential Potential alteration in comfort, pain Goals: Patient will verbalize adequate pain control and receive pain control interventions during procedures as needed Date Initiated: 02/23/2020 Target Resolution Date: 04/20/2020 Goal Status: Active Patient/caregiver will verbalize comfort level met Date Initiated: 02/23/2020 Date Inactivated: 03/22/2020 Target Resolution Date: 03/23/2020 Goal Status: Met Interventions: Provide education on pain management Reposition patient for comfort Notes: Wound/Skin Impairment Nursing Diagnoses: Knowledge deficit related to ulceration/compromised skin integrity Goals: Ulcer/skin breakdown will heal within 14 weeks Date Initiated: 02/23/2020 Target Resolution Date: 06/01/2020 Goal Status: Active Interventions: Assess patient/caregiver ability to obtain necessary supplies Assess patient/caregiver ability to perform ulcer/skin care regimen upon admission and as needed Provide education on ulcer and skin care Treatment Activities: Skin care regimen initiated : 02/23/2020 Topical wound management initiated : 02/23/2020 Notes: Electronic Signature(s) Signed: 03/29/2020 5:09:44 PM By: Deon Pilling Entered By: Deon Pilling on 03/29/2020 14:09:30 -------------------------------------------------------------------------------- Pain Assessment Details Patient Name: Date of Service: [name], [name] 03/29/2020 1:45 PM Medical Record Number: [id number] Patient Account Number: [id number] Date of Birth/Sex: Treating RN: Sep 08, 1945 (74 y.o. [name] Primary Care Wenzel Backlund: [name] Other Clinician: Referring Sairah Knobloch: Treating Zenaya Ulatowski/Extender: Drucilla Schmidt Weeks in Treatment: 5 Active Problems Location of Pain Severity and Description of Pain Patient Has Paino Yes Site Locations Rate the pain. Current Pain Level: 4 Pain Management and Medication Current Pain  Management: Electronic Signature(s) Signed: 03/29/2020 5:09:44 PM By: Deon Pilling Signed: 03/30/2020 10:09:56 AM By: Sandre Kitty Entered By: Sandre Kitty on 03/29/2020 13:49:31 -------------------------------------------------------------------------------- Patient/Caregiver Education Details Patient Name: Date of Service: [name], [name] 11/11/2021andnbsp1:45 PM Medical Record Number: [id number] Patient Account Number: [id number] Date of Birth/Gender: Treating RN: 03/16/1946 (74 y.o. [name] Primary Care Physician: [name] Other Clinician: Referring Physician: Treating Physician/Extender: Alycia Rossetti in Treatment: 5 Education Assessment Education Provided To: Patient Education Topics Provided Nutrition: Handouts: Elevated Blood Sugars: How Do They Affect Wound  Healing Methods: Explain/Verbal Responses: Reinforcements needed Electronic Signature(s) Signed: 03/29/2020 5:09:44 PM By: Deon Pilling Entered By: Deon Pilling on 03/29/2020 14:09:41 -------------------------------------------------------------------------------- Wound Assessment Details Patient Name: Date of Service: [name], [name] 03/29/2020 1:45 PM Medical Record Number: [id number] Patient Account Number: [id number] Date of Birth/Sex: Treating RN: Sep 22, 1945 (74 y.o. [name], [name] Primary Care Tanis Hensarling: [name] Other Clinician: Referring Henri Baumler: Treating Tiffiny Worthy/Extender: Drucilla Schmidt Weeks in Treatment: 5 Wound Status Wound Number: 1 Primary Lymphedema Etiology: Wound Location: Right, Medial Lower Leg Wound Status: Open Wounding Event: Blister Comorbid Lymphedema, Deep Vein Thrombosis, Hypertension, Date Acquired: 01/15/2020 History: Received Radiation Weeks Of Treatment: 5 Clustered Wound: No Photos Photo Uploaded By: Mikeal Hawthorne on 04/02/2020 12:30:36 Wound Measurements Length: (cm) 0.3 Width: (cm)  0.6 Depth: (cm) 0.7 Area: (cm) 0.141 Volume: (cm) 0.099 % Reduction in Area: 82% % Reduction in Volume: -25.3% Epithelialization: Medium (34-66%) Tunneling: No Undermining: No Wound Description Classification: Full Thickness Without Exposed Support Structures Wound Margin: Well defined, not attached Exudate Amount: Medium Exudate Type: Serous Exudate Color: amber Foul Odor After Cleansing: No Slough/Fibrino Yes Wound Bed Granulation Amount: Medium (34-66%) Exposed Structure Granulation Quality: Pink Fascia Exposed: No Necrotic Amount: Medium (34-66%) Fat Layer (Subcutaneous Tissue) Exposed: Yes Necrotic Quality: Adherent Slough Tendon Exposed: No Muscle Exposed: No Joint Exposed: No Bone Exposed: No Treatment Notes Wound #1 (Right, Medial Lower Leg) 2. Periwound Care Moisturizing lotion TCA Cream 3. Primary Dressing Applied Collegen AG 4. Secondary Dressing Dry Gauze Drawtex 6. Support Layer Applied 4 layer compression wrap Electronic Signature(s) Signed: 03/29/2020 5:09:44 PM By: Deon Pilling Signed: 04/02/2020 5:51:53 PM By: Levan Hurst RN, [name] Entered By: Levan Hurst on 03/29/2020 14:17:56 -------------------------------------------------------------------------------- Wound Assessment Details Patient Name: Date of Service: [name], [name] 03/29/2020 1:45 PM Medical Record Number: [id number] Patient Account Number: [id number] Date of Birth/Sex: Treating RN: 1945-05-25 (74 y.o. [name], [name] Primary Care Jeryl Umholtz: [name] Other Clinician: Referring Xavi Tomasik: Treating Joua Bake/Extender: Drucilla Schmidt Weeks in Treatment: 5 Wound Status Wound Number: 2 Primary Lymphedema Etiology: Wound Location: Left, Lateral Lower Leg Wound Status: Open Wounding Event: Blister Comorbid Lymphedema, Deep Vein Thrombosis, Hypertension, Date Acquired: 01/15/2020 History: Received Radiation Weeks Of Treatment: 5 Clustered Wound:  No Photos Photo Uploaded By: Mikeal Hawthorne on 04/02/2020 12:32:13 Wound Measurements Length: (cm) 1.7 Width: (cm) 0.7 Depth: (cm) 1 Area: (cm) 0.935 Volume: (cm) 0.935 % Reduction in Area: 72.9% % Reduction in Volume: 82% Epithelialization: Small (1-33%) Tunneling: No Undermining: No Wound Description Classification: Full Thickness Without Exposed Support Structures Wound Margin: Well defined, not attached Exudate Amount: Large Exudate Type: Serosanguineous Exudate Color: red, brown Foul Odor After Cleansing: No Slough/Fibrino Yes Wound Bed Granulation Amount: Medium (34-66%) Exposed Structure Granulation Quality: Red Fascia Exposed: No Necrotic Amount: Medium (34-66%) Fat Layer (Subcutaneous Tissue) Exposed: Yes Necrotic Quality: Adherent Slough Tendon Exposed: No Muscle Exposed: No Joint Exposed: No Bone Exposed: No Treatment Notes Wound #2 (Left, Lateral Lower Leg) 2. Periwound Care Moisturizing lotion TCA Cream 3. Primary Dressing Applied Collegen AG 4. Secondary Dressing Dry Gauze Drawtex 6. Support Layer Applied 4 layer compression wrap Electronic Signature(s) Signed: 03/29/2020 5:09:44 PM By: Deon Pilling Signed: 04/02/2020 5:51:53 PM By: Levan Hurst RN, [name] Entered By: Levan Hurst on 03/29/2020 14:18:07 -------------------------------------------------------------------------------- Wound Assessment Details Patient Name: Date of Service: [name], [name] 03/29/2020 1:45 PM Medical Record Number: [id number] Patient Account Number: [id number] Date of Birth/Sex: Treating RN: [birth year] (74 y.o. [name] Primary Care Salar Molden: Dixie Dials  S Other Clinician: Referring Burnette Valenti: Treating Kehinde Bowdish/Extender: Salvadore Oxford, [name] Weeks in Treatment: 5 Wound Status Wound Number: 3 Primary Lymphedema Etiology: Wound Location: Left, Anterior Lower Leg Wound Status: Open Wounding Event: Blister Comorbid Lymphedema, Deep  Vein Thrombosis, Hypertension, Date Acquired: 01/15/2020 History: Received Radiation Weeks Of Treatment: 5 Clustered Wound: No Photos Photo Uploaded By: Mikeal Hawthorne on 04/02/2020 12:31:18 Wound Measurements Length: (cm) 0.3 Width: (cm) 0.3 Depth: (cm) 0.2 Area: (cm) 0.071 Volume: (cm) 0.014 % Reduction in Area: 94.7% % Reduction in Volume: 99.1% Epithelialization: Medium (34-66%) Tunneling: No Undermining: No Wound Description Classification: Full Thickness Without Exposed Support Structures Wound Margin: Well defined, not attached Exudate Amount: Medium Exudate Type: Serosanguineous Exudate Color: red, brown Foul Odor After Cleansing: No Slough/Fibrino Yes Wound Bed Granulation Amount: Large (67-100%) Exposed Structure Granulation Quality: Red Fascia Exposed: No Necrotic Amount: Small (1-33%) Fat Layer (Subcutaneous Tissue) Exposed: Yes Necrotic Quality: Adherent Slough Tendon Exposed: No Muscle Exposed: No Joint Exposed: No Bone Exposed: No Treatment Notes Wound #3 (Left, Anterior Lower Leg) 2. Periwound Care Moisturizing lotion TCA Cream 3. Primary Dressing Applied Collegen AG 4. Secondary Dressing Dry Gauze Drawtex 6. Support Layer Applied 4 layer compression wrap Electronic Signature(s) Signed: 03/29/2020 5:09:44 PM By: Deon Pilling Signed: 04/02/2020 5:51:53 PM By: Levan Hurst RN, [name] Entered By: Levan Hurst on 03/29/2020 14:18:18 -------------------------------------------------------------------------------- Wound Assessment Details Patient Name: Date of Service: An, [name] 03/29/2020 1:45 PM Medical Record Number: [id number] Patient Account Number: [id number] Date of Birth/Sex: Treating RN: Oct 08, 1945 (74 y.o. [name], [name] Primary Care Mat Stuard: [name] Other Clinician: Referring Alecxander Mainwaring: Treating Diedre Maclellan/Extender: Drucilla Schmidt Weeks in Treatment: 5 Wound Status Wound Number: 4 Primary  Lymphedema Etiology: Wound Location: Left, Proximal, Medial Lower Leg Wound Status: Open Wounding Event: Blister Comorbid Lymphedema, Deep Vein Thrombosis, Hypertension, Date Acquired: 01/15/2020 History: Received Radiation Weeks Of Treatment: 5 Clustered Wound: No Photos Photo Uploaded By: Mikeal Hawthorne on 04/02/2020 12:31:19 Wound Measurements Length: (cm) 0.6 Width: (cm) 0.5 Depth: (cm) 0.1 Area: (cm) 0.236 Volume: (cm) 0.024 % Reduction in Area: 90.6% % Reduction in Volume: 98.9% Epithelialization: Medium (34-66%) Tunneling: No Undermining: No Wound Description Classification: Full Thickness Without Exposed Support Structu Wound Margin: Well defined, not attached Exudate Amount: Medium Exudate Type: Serous Exudate Color: amber res Foul Odor After Cleansing: No Slough/Fibrino Yes Wound Bed Granulation Amount: Medium (34-66%) Exposed Structure Granulation Quality: Pink Fascia Exposed: No Necrotic Amount: Medium (34-66%) Fat Layer (Subcutaneous Tissue) Exposed: Yes Necrotic Quality: Adherent Slough Tendon Exposed: No Muscle Exposed: No Joint Exposed: No Bone Exposed: No Treatment Notes Wound #4 (Left, Proximal, Medial Lower Leg) 2. Periwound Care Moisturizing lotion TCA Cream 3. Primary Dressing Applied Collegen AG 4. Secondary Dressing Dry Gauze Drawtex 6. Support Layer Applied 4 layer compression wrap Electronic Signature(s) Signed: 03/29/2020 5:09:44 PM By: Deon Pilling Signed: 04/02/2020 5:51:53 PM By: Levan Hurst RN, [name] Entered By: Levan Hurst on 03/29/2020 14:18:28 -------------------------------------------------------------------------------- [place] Details Patient Name: Date of Service: [place], [name] 03/29/2020 1:45 PM Medical Record Number: [id number] Patient Account Number: [id number] Date of Birth/Sex: Treating RN: [birth year] (74 y.o. [name] Primary Care Teancum Brule: [name] Other Clinician: Referring  Skylene Deremer: Treating Catlyn Shipton/Extender: Drucilla Schmidt Weeks in Treatment: 5 Vital Signs Time Taken: 13:49 Temperature (F): 98.4 Pulse (bpm): 94 Respiratory Rate (breaths/min): 18 Blood Pressure (mmHg): 170/72 Reference Range: 80 - 120 mg / dl Electronic Signature(s) Signed: 03/30/2020 10:09:56 AM By: Sandre Kitty Entered By: Sandre Kitty on 03/29/2020  13:49:23

## 2020-04-05 ENCOUNTER — Other Ambulatory Visit: Payer: Self-pay

## 2020-04-05 ENCOUNTER — Encounter (HOSPITAL_BASED_OUTPATIENT_CLINIC_OR_DEPARTMENT_OTHER): Payer: Medicare Other | Admitting: Internal Medicine

## 2020-04-05 DIAGNOSIS — L97822 Non-pressure chronic ulcer of other part of left lower leg with fat layer exposed: Secondary | ICD-10-CM | POA: Diagnosis not present

## 2020-04-05 NOTE — Progress Notes (Signed)
Ryer, DENNISE ([id number]) Visit Report for 04/05/2020 SuperBill Details Patient Name: Date of Service: Goodpasture, [name] 04/05/2020 Medical Record Number: [id number] Patient Account Number: [id number] Date of Birth/Sex: Treating RN: [birth year] (74 y.o. Janyth Contes Primary Care Provider: [name] Other Clinician: Referring Provider: Treating Provider/Extender: Drucilla Schmidt Weeks in Treatment: 6 Diagnosis Coding ICD-10 Codes Code Description I89.0 Lymphedema, not elsewhere classified L97.822 Non-pressure chronic ulcer of other part of left lower leg with fat layer exposed L97.812 Non-pressure chronic ulcer of other part of right lower leg with fat layer exposed Facility Procedures CPT4 Description Modifier Quantity Code [id number] BILATERAL: Application of multi-layer venous compression system; leg (below knee), including ankle and 1 foot. Electronic Signature(s) Signed: 04/05/2020 5:14:03 PM By: Linton Ham MD Signed: 04/05/2020 5:57:23 PM By: Levan Hurst RN, [name] Entered By: Levan Hurst on 04/05/2020 12:27:45

## 2020-04-05 NOTE — Progress Notes (Signed)
[name], [name] ([id number]) Visit Report for 04/05/2020 Arrival Information Details Patient Name: Date of Service: [name], [name] 04/05/2020 11:15 A M Medical Record Number: [id number] Patient Account Number: [id number] Date of Birth/Sex: Treating RN: 08-Aug-1945 (74 y.o. Janyth Contes Primary Care Philomene Haff: [name] Other Clinician: Referring Bob Daversa: Treating Craigory Toste/Extender: Drucilla Schmidt Weeks in Treatment: 6 Visit Information History Since Last Visit Added or deleted any medications: No Patient Arrived: Ambulatory Any new allergies or adverse reactions: No Arrival Time: 11:35 Had a fall or experienced change in No Accompanied By: alone activities of daily living that may affect Transfer Assistance: None risk of falls: Patient Identification Verified: Yes Signs or symptoms of abuse/neglect since last visito No Secondary Verification Process Completed: Yes Hospitalized since last visit: No Patient Requires Transmission-Based Precautions: No Implantable device outside of the clinic excluding No Patient Has Alerts: Yes cellular tissue based products placed in the center Patient Alerts: Patient on Blood Thinner since last visit: on warfarin Has Dressing in Place as Prescribed: Yes Bilateral ABI: [place] Has Compression in Place as Prescribed: Yes Pain Present Now: No Electronic Signature(s) Signed: 04/05/2020 5:57:23 PM By: Levan Hurst RN, [name] Entered By: Levan Hurst on 04/05/2020 12:24:42 -------------------------------------------------------------------------------- Compression Therapy Details Patient Name: Date of Service: [name], [name] 04/05/2020 11:15 A M Medical Record Number: [id number] Patient Account Number: [id number] Date of Birth/Sex: Treating RN: 07-09-1945 (74 y.o. Janyth Contes Primary Care Nekita Pita: [name] Other Clinician: Referring Airen Stiehl: Treating Khiyan Crace/Extender: Drucilla Schmidt Weeks in Treatment: 6 Compression Therapy Performed for Wound Assessment: Wound #1 Right,Medial Lower Leg Performed By: Clinician Levan Hurst, RN Compression Type: Four Layer Electronic Signature(s) Signed: 04/05/2020 5:57:23 PM By: Levan Hurst RN, [name] Entered By: Levan Hurst on 04/05/2020 12:26:03 -------------------------------------------------------------------------------- Compression Therapy Details Patient Name: Date of Service: [name], [name] 04/05/2020 11:15 A M Medical Record Number: [id number] Patient Account Number: [id number] Date of Birth/Sex: Treating RN: November 04, 1945 (74 y.o. Janyth Contes Primary Care Jevin [name]: [name] Other Clinician: Referring Kaleb Linquist: Treating Junious Ragone/Extender: Drucilla Schmidt Weeks in Treatment: 6 Compression Therapy Performed for Wound Assessment: Wound #2 Left,Lateral Lower Leg Performed By: Clinician Levan Hurst, RN Compression Type: Four Layer Electronic Signature(s) Signed: 04/05/2020 5:57:23 PM By: Levan Hurst RN, [name] Entered By: Levan Hurst on 04/05/2020 12:26:03 -------------------------------------------------------------------------------- Compression Therapy Details Patient Name: Date of Service: [name], [name] 04/05/2020 11:15 A M Medical Record Number: [id number] Patient Account Number: [id number] Date of Birth/Sex: Treating RN: [birth year] (74 y.o. Janyth Contes Primary Care Jennamarie Goings: [name] Other Clinician: Referring Colbie Danner: Treating Rifka Ramey/Extender: Drucilla Schmidt Weeks in Treatment: 6 Compression Therapy Performed for Wound Assessment: Wound #3 Left,Anterior Lower Leg Performed By: Clinician Levan Hurst, RN Compression Type: Four Layer Electronic Signature(s) Signed: 04/05/2020 5:57:23 PM By: Levan Hurst RN, [name] Entered By: Levan Hurst on 04/05/2020  12:26:03 -------------------------------------------------------------------------------- Compression Therapy Details Patient Name: Date of Service: [name], [name] 04/05/2020 11:15 A M Medical Record Number: [id number] Patient Account Number: [id number] Date of Birth/Sex: Treating RN: 1946-04-15 (74 y.o. Janyth Contes Primary Care Yovanny Coats: [name] Other Clinician: Referring Keatyn Jawad: Treating Keyonte Cookston/Extender: Drucilla Schmidt Weeks in Treatment: 6 Compression Therapy Performed for Wound Assessment: Wound #4 Left,Proximal,Medial Lower Leg Performed By: Clinician Levan Hurst, RN Compression Type: Four Layer Electronic Signature(s) Signed: 04/05/2020 5:57:23 PM By: Levan Hurst RN, [name] Entered By: Levan Hurst on 04/05/2020 12:26:04 -------------------------------------------------------------------------------- Encounter Discharge Information Details Patient Name: Date of  Service: [name], [name] 04/05/2020 11:15 A M Medical Record Number: [id number] Patient Account Number: [id number] Date of Birth/Sex: Treating RN: 03/11/1946 (74 y.o. Janyth Contes Primary Care Jaretzy Lhommedieu: Other Clinician: [name] Referring Melitta Tigue: Treating Mahesh Sizemore/Extender: Alycia Rossetti in Treatment: 6 Encounter Discharge Information Items Discharge Condition: Stable Ambulatory Status: Ambulatory Discharge Destination: Home Transportation: Private Auto Accompanied By: alone Schedule Follow-up Appointment: Yes Clinical Summary of Care: Patient Declined Electronic Signature(s) Signed: 04/05/2020 5:57:23 PM By: Levan Hurst RN, [name] Entered By: Levan Hurst on 04/05/2020 12:27:32 -------------------------------------------------------------------------------- Wound Assessment Details Patient Name: Date of Service: [name], [name] 04/05/2020 11:15 A M Medical Record Number: [id number] Patient Account Number:  [id number] Date of Birth/Sex: Treating RN: [birth year] (74 y.o. Janyth Contes Primary Care Attila Mccarthy: [name] Other Clinician: Referring Semiah Konczal: Treating Dorena Dorfman/Extender: Drucilla Schmidt Weeks in Treatment: 6 Wound Status Wound Number: 1 Primary Lymphedema Etiology: Wound Location: Right, Medial Lower Leg Wound Status: Open Wounding Event: Blister Comorbid Lymphedema, Deep Vein Thrombosis, Hypertension, Date Acquired: 01/15/2020 History: Received Radiation Weeks Of Treatment: 6 Clustered Wound: No Wound Measurements Length: (cm) 0.3 Width: (cm) 0.6 Depth: (cm) 0.7 Area: (cm) 0.141 Volume: (cm) 0.099 % Reduction in Area: 82% % Reduction in Volume: -25.3% Epithelialization: Medium (34-66%) Tunneling: No Undermining: No Wound Description Classification: Full Thickness Without Exposed Support Structures Wound Margin: Well defined, not attached Exudate Amount: Medium Exudate Type: Serous Exudate Color: amber Foul Odor After Cleansing: No Slough/Fibrino Yes Wound Bed Granulation Amount: Medium (34-66%) Exposed Structure Granulation Quality: Pink Fascia Exposed: No Necrotic Amount: Medium (34-66%) Fat Layer (Subcutaneous Tissue) Exposed: Yes Necrotic Quality: Adherent Slough Tendon Exposed: No Muscle Exposed: No Joint Exposed: No Bone Exposed: No Treatment Notes Wound #1 (Right, Medial Lower Leg) 1. Cleanse With Soap and water 2. Periwound Care Moisturizing lotion TCA Cream 3. Primary Dressing Applied Collegen AG 4. Secondary Dressing Dry Gauze Drawtex 6. Support Layer Applied 4 layer compression Water quality scientist) Signed: 04/05/2020 5:57:23 PM By: Levan Hurst RN, [name] Entered By: Levan Hurst on 04/05/2020 12:25:31 -------------------------------------------------------------------------------- Wound Assessment Details Patient Name: Date of Service: [name], [name] 04/05/2020 11:15 A M Medical Record  Number: [id number] Patient Account Number: [id number] Date of Birth/Sex: Treating RN: [birth year] (74 y.o. Janyth Contes Primary Care Ahmar Pickrell: [name] Other Clinician: Referring Kebron Pulse: Treating Rohnan Bartleson/Extender: Drucilla Schmidt Weeks in Treatment: 6 Wound Status Wound Number: 2 Primary Lymphedema Etiology: Wound Location: Left, Lateral Lower Leg Wound Status: Open Wounding Event: Blister Comorbid Lymphedema, Deep Vein Thrombosis, Hypertension, Date Acquired: 01/15/2020 History: Received Radiation Weeks Of Treatment: 6 Clustered Wound: No Wound Measurements Length: (cm) 1.7 Width: (cm) 0.7 Depth: (cm) 1 Area: (cm) 0.935 Volume: (cm) 0.935 % Reduction in Area: 72.9% % Reduction in Volume: 82% Epithelialization: Small (1-33%) Tunneling: No Undermining: No Wound Description Classification: Full Thickness Without Exposed Support Structures Wound Margin: Well defined, not attached Exudate Amount: Large Exudate Type: Serosanguineous Exudate Color: red, brown Foul Odor After Cleansing: No Slough/Fibrino Yes Wound Bed Granulation Amount: Medium (34-66%) Exposed Structure Granulation Quality: Red Fascia Exposed: No Necrotic Amount: Medium (34-66%) Fat Layer (Subcutaneous Tissue) Exposed: Yes Necrotic Quality: Adherent Slough Tendon Exposed: No Muscle Exposed: No Joint Exposed: No Bone Exposed: No Treatment Notes Wound #2 (Left, Lateral Lower Leg) 1. Cleanse With Soap and water 2. Periwound Care Moisturizing lotion TCA Cream 3. Primary Dressing Applied Collegen AG 4. Secondary Dressing Dry Gauze Drawtex 6. Support Layer Applied 4 layer compression Water quality scientist)  Signed: 04/05/2020 5:57:23 PM By: Levan Hurst RN, [name] Entered By: Levan Hurst on 04/05/2020 12:25:37 -------------------------------------------------------------------------------- Wound Assessment Details Patient Name: Date of Service: [name],  [name] 04/05/2020 11:15 A M Medical Record Number: [id number] Patient Account Number: [id number] Date of Birth/Sex: Treating RN: [birth year] (74 y.o. Janyth Contes Primary Care Rudy Luhmann: [name] Other Clinician: Referring Trea Latner: Treating [place] Lapaglia/Extender: Drucilla Schmidt Weeks in Treatment: 6 Wound Status Wound Number: 3 Primary Lymphedema Etiology: Wound Location: Left, Anterior Lower Leg Wound Status: Open Wounding Event: Blister Comorbid Lymphedema, Deep Vein Thrombosis, Hypertension, Date Acquired: 01/15/2020 History: Received Radiation Weeks Of Treatment: 6 Clustered Wound: No Wound Measurements Length: (cm) 0.3 Width: (cm) 0.3 Depth: (cm) 0.2 Area: (cm) 0.071 Volume: (cm) 0.014 % Reduction in Area: 94.7% % Reduction in Volume: 99.1% Epithelialization: Medium (34-66%) Tunneling: No Undermining: No Wound Description Classification: Full Thickness Without Exposed Support Structures Wound Margin: Well defined, not attached Exudate Amount: Medium Exudate Type: Serosanguineous Exudate Color: red, brown Foul Odor After Cleansing: No Slough/Fibrino Yes Wound Bed Granulation Amount: Large (67-100%) Exposed Structure Granulation Quality: Red Fascia Exposed: No Necrotic Amount: Small (1-33%) Fat Layer (Subcutaneous Tissue) Exposed: Yes Necrotic Quality: Adherent Slough Tendon Exposed: No Muscle Exposed: No Joint Exposed: No Bone Exposed: No Treatment Notes Wound #3 (Left, Anterior Lower Leg) 1. Cleanse With Soap and water 2. Periwound Care Moisturizing lotion TCA Cream 3. Primary Dressing Applied Collegen AG 4. Secondary Dressing Dry Gauze Drawtex 6. Support Layer Applied 4 layer compression Water quality scientist) Signed: 04/05/2020 5:57:23 PM By: Levan Hurst RN, [name] Entered By: Levan Hurst on 04/05/2020 12:25:43 -------------------------------------------------------------------------------- Wound Assessment  Details Patient Name: Date of Service: [name], [name] 04/05/2020 11:15 A M Medical Record Number: [id number] Patient Account Number: [id number] Date of Birth/Sex: Treating RN: 1946-03-07 (74 y.o. Janyth Contes Primary Care Javarius Tsosie: [name] Other Clinician: Referring Keyera Hattabaugh: Treating Rishard Delange/Extender: Drucilla Schmidt Weeks in Treatment: 6 Wound Status Wound Number: 4 Primary Lymphedema Etiology: Wound Location: Left, Proximal, Medial Lower Leg Wound Status: Open Wounding Event: Blister Comorbid Lymphedema, Deep Vein Thrombosis, Hypertension, Date Acquired: 01/15/2020 History: Received Radiation Weeks Of Treatment: 6 Clustered Wound: No Wound Measurements Length: (cm) 0.6 Width: (cm) 0.5 Depth: (cm) 0.1 Area: (cm) 0.236 Volume: (cm) 0.024 % Reduction in Area: 90.6% % Reduction in Volume: 98.9% Epithelialization: Medium (34-66%) Tunneling: No Undermining: No Wound Description Classification: Full Thickness Without Exposed Support Structures Wound Margin: Well defined, not attached Exudate Amount: Medium Exudate Type: Serous Exudate Color: amber Foul Odor After Cleansing: No Slough/Fibrino Yes Wound Bed Granulation Amount: Medium (34-66%) Exposed Structure Granulation Quality: Pink Fascia Exposed: No Necrotic Amount: Medium (34-66%) Fat Layer (Subcutaneous Tissue) Exposed: Yes Necrotic Quality: Adherent Slough Tendon Exposed: No Muscle Exposed: No Joint Exposed: No Bone Exposed: No Treatment Notes Wound #4 (Left, Proximal, Medial Lower Leg) 1. Cleanse With Soap and water 2. Periwound Care Moisturizing lotion TCA Cream 3. Primary Dressing Applied Collegen AG 4. Secondary Dressing Dry Gauze Drawtex 6. Support Layer Applied 4 layer compression Water quality scientist) Signed: 04/05/2020 5:57:23 PM By: Levan Hurst RN, [name] Entered By: Levan Hurst on 04/05/2020  12:25:50 -------------------------------------------------------------------------------- Petros Details Patient Name: Date of Service: [name], [name] 04/05/2020 11:15 A M Medical Record Number: [id number] Patient Account Number: [id number] Date of Birth/Sex: Treating RN: Dec 13, 1945 (74 y.o. Janyth Contes Primary Care Lizzette Carbonell: [name] Other Clinician: Referring Francys Bolin: Treating Amorita Vanrossum/Extender: Drucilla Schmidt Weeks in Treatment: 6 Vital Signs Time Taken: 11:35  Temperature (F): 98.1 Pulse (bpm): 96 Respiratory Rate (breaths/min): 18 Blood Pressure (mmHg): 180/73 Reference Range: 80 - 120 mg / dl Electronic Signature(s) Signed: 04/05/2020 5:57:23 PM By: Levan Hurst RN, [name] Entered By: Levan Hurst on 04/05/2020 12:25:03

## 2020-04-10 ENCOUNTER — Other Ambulatory Visit: Payer: Self-pay

## 2020-04-10 ENCOUNTER — Encounter (HOSPITAL_BASED_OUTPATIENT_CLINIC_OR_DEPARTMENT_OTHER): Payer: Medicare Other | Admitting: Internal Medicine

## 2020-04-10 DIAGNOSIS — L97822 Non-pressure chronic ulcer of other part of left lower leg with fat layer exposed: Secondary | ICD-10-CM | POA: Diagnosis not present

## 2020-04-10 NOTE — Progress Notes (Addendum)
[name], [name] ([id number]) Visit Report for 04/10/2020 Arrival Information Details Patient Name: Date of Service: [name], [name] 04/10/2020 11:00 A M Medical Record Number: [id number] Patient Account Number: [id number] Date of Birth/Sex: Treating RN: [birth year] (74 y.o. M) Primary Care [name]: [name] [name] Clinician: Referring [name]: Treating [name]/Extender: [name] Weeks in Treatment: 6 Visit Information History Since Last Visit Added or deleted any medications: No Patient Arrived: Ambulatory Any new allergies or adverse reactions: No Arrival Time: 11:15 Had a fall or experienced change in No Accompanied By: self activities of daily living that may affect Transfer Assistance: None risk of falls: Patient Identification Verified: Yes Signs or symptoms of abuse/neglect since last visito No Secondary Verification Process Completed: Yes Hospitalized since last visit: No Patient Requires Transmission-Based Precautions: No Implantable device outside of the clinic excluding No Patient Has Alerts: Yes cellular tissue based products placed in the center Patient Alerts: Patient on Blood Thinner since last visit: on warfarin Has Dressing in Place as Prescribed: Yes Bilateral ABI: [name] Pain Present Now: No Electronic Signature(s) Signed: 04/10/2020 11:17:13 AM By: [name] Entered By: [name] on 04/10/2020 11:16:10 -------------------------------------------------------------------------------- Compression Therapy Details Patient Name: Date of Service: [name], [name] 04/10/2020 11:00 A M Medical Record Number: [id number] Patient Account Number: [id number] Date of Birth/Sex: Treating RN: 1945/09/11 (74 y.o. [name], [name] Primary Care Janalee Grobe: [name] [name] Clinician: Referring [name]: Treating [name]/Extender: [name] Weeks in Treatment: 6 Compression Therapy Performed for Wound  Assessment: Wound #1 Right,Medial Lower Leg Performed By: Clinician [name] Hammock, RN Compression Type: Four Layer Electronic Signature(s) Signed: 04/10/2020 12:25:15 PM By: [name] Hammock RN Entered By: [name] on 04/10/2020 12:25:14 -------------------------------------------------------------------------------- Compression Therapy Details Patient Name: Date of Service: [name], [name] 04/10/2020 11:00 A M Medical Record Number: [id number] Patient Account Number: [id number] Date of Birth/Sex: Treating RN: 16-Jul-1945 (74 y.o. [name] Primary Care [name]: [name] Clinician: Birdie [name] Referring [name]: Treating [name]/Extender: [name] Weeks in Treatment: 6 Compression Therapy Performed for Wound Assessment: Wound #2 Left,Lateral Lower Leg Performed By: Clinician [name] Hammock, RN Compression Type: Four Layer Electronic Signature(s) Signed: 04/10/2020 12:25:15 PM By: [name] Hammock RN Entered By: [name] on 04/10/2020 12:25:15 -------------------------------------------------------------------------------- Compression Therapy Details Patient Name: Date of Service: [name], [name] 04/10/2020 11:00 A M Medical Record Number: [id number] Patient Account Number: [id number] Date of Birth/Sex: Treating RN: November 17, 1945 (74 y.o. [name], [name] Primary Care Skyler Carel: [name] [name] Clinician: Referring [name]: Treating [name]/Extender: [name] Weeks in Treatment: 6 Compression Therapy Performed for Wound Assessment: Wound #3 Left,Anterior Lower Leg Performed By: Clinician [name] Hammock, RN Compression Type: Four Layer Electronic Signature(s) Signed: 04/10/2020 12:25:16 PM By: [name] Hammock RN Entered By: [name] on 04/10/2020 12:25:15 -------------------------------------------------------------------------------- Compression Therapy  Details Patient Name: Date of Service: [name], [name] 04/10/2020 11:00 A M Medical Record Number: [id number] Patient Account Number: [id number] Date of Birth/Sex: Treating RN: Aug 24, 1945 (74 y.o. [name], [name] Primary Care [name]: [name] [name] Clinician: Referring [name]: Treating [name]/Extender: [name] Weeks in Treatment: 6 Compression Therapy Performed for Wound Assessment: Wound #4 Left,Proximal,Medial Lower Leg Performed By: Clinician [name] Hammock, RN Compression Type: Four Layer Electronic Signature(s) Signed: 04/10/2020 12:25:16 PM By: [name] Hammock RN Entered By: [name] on 04/10/2020 12:25:16 -------------------------------------------------------------------------------- Encounter Discharge Information Details Patient Name: Date of Service: [name], [name] 04/10/2020 11:00 A M Medical Record Number: [id number] Patient Account Number:  [id number] Date of Birth/Sex: Treating RN: 06-22-1945 (74 y.o. [name], [name] Primary Care Demarrio Menges: [name] [name] Clinician: Referring [name]: Treating [name]/Extender: [name] in Treatment: 6 Encounter Discharge Information Items Discharge Condition: Stable Ambulatory Status: Ambulatory Discharge Destination: Home Transportation: Private Auto Accompanied By: self Schedule Follow-up Appointment: Yes Clinical Summary of Care: Patient Declined Electronic Signature(s) Signed: 04/10/2020 12:26:34 PM By: [name] Hammock RN Entered By: [name] on 04/10/2020 12:26:34 -------------------------------------------------------------------------------- Patient/Caregiver Education Details Patient Name: Date of Service: [name], [name] 11/23/2021andnbsp11:00 A M Medical Record Number: [id number] Patient Account Number: [id number] Date of Birth/Gender: Treating RN: 1945/11/14 (75 y.o. [name] Primary  Care Physician: [name] [name] Clinician: Referring Physician: Treating Physician/Extender: [name] in Treatment: 6 Education Assessment Education Provided To: Patient Education Topics Provided Nutrition: Handouts: Nutrition Methods: Explain/Verbal Responses: Reinforcements needed Wound/Skin Impairment: Electronic Signature(s) Signed: 04/16/2020 5:13:46 PM By: [name] Hammock RN Entered By: [name] on 04/10/2020 12:26:04 -------------------------------------------------------------------------------- Wound Assessment Details Patient Name: Date of Service: [name], [name] 04/10/2020 11:00 A M Medical Record Number: [id number] Patient Account Number: [id number] Date of Birth/Sex: Treating RN: 11-07-1945 (74 y.o. M) Primary Care Everli Rother: [name] [name] Clinician: Referring Lindy Pennisi: Treating Nola Botkins/Extender: [name] Weeks in Treatment: 6 Wound Status Wound Number: 1 Primary Etiology: Lymphedema Wound Location: Right, Medial Lower Leg Wound Status: Open Wounding Event: Blister Date Acquired: 01/15/2020 Weeks Of Treatment: 6 Clustered Wound: No Wound Measurements Length: (cm) 0.3 Width: (cm) 0.6 Depth: (cm) 0.7 Area: (cm) 0.141 Volume: (cm) 0.099 % Reduction in Area: 82% % Reduction in Volume: -25.3% Wound Description Classification: Full Thickness Without Exposed Support Structur es Treatment Notes Wound #1 (Right, Medial Lower Leg) 1. Cleanse With Soap and water 2. Periwound Care Moisturizing lotion TCA Cream 3. Primary Dressing Applied Collegen AG 4. Secondary Dressing Dry Gauze Drawtex 6. Support Layer Applied 4 layer compression Water quality scientist) Signed: 04/10/2020 11:17:13 AM By: [name] Entered By: [name] on 04/10/2020 11:16:54 -------------------------------------------------------------------------------- Wound Assessment  Details Patient Name: Date of Service: [name], [name] 04/10/2020 11:00 A M Medical Record Number: [id number] Patient Account Number: [id number] Date of Birth/Sex: Treating RN: 06/03/1945 (74 y.o. M) Primary Care Jameika Kinn: [name] [name] Clinician: Referring Perris Tripathi: Treating Azie Mcconahy/Extender: [name] Weeks in Treatment: 6 Wound Status Wound Number: 2 Primary Etiology: Lymphedema Wound Location: Left, Lateral Lower Leg Wound Status: Open Wounding Event: Blister Date Acquired: 01/15/2020 Weeks Of Treatment: 6 Clustered Wound: No Wound Measurements Length: (cm) 1.7 Width: (cm) 0.7 Depth: (cm) 1 Area: (cm) 0.935 Volume: (cm) 0.935 % Reduction in Area: 72.9% % Reduction in Volume: 82% Wound Description Classification: Full Thickness Without Exposed Support Structur es Treatment Notes Wound #2 (Left, Lateral Lower Leg) 1. Cleanse With Soap and water 2. Periwound Care Moisturizing lotion TCA Cream 3. Primary Dressing Applied Collegen AG 4. Secondary Dressing Dry Gauze Drawtex 6. Support Layer Applied 4 layer compression Water quality scientist) Signed: 04/10/2020 11:17:13 AM By: [name] Entered By: [name] on 04/10/2020 11:16:55 -------------------------------------------------------------------------------- Wound Assessment Details Patient Name: Date of Service: [name], [name] 04/10/2020 11:00 A M Medical Record Number: [id number] Patient Account Number: [id number] Date of Birth/Sex: Treating RN: 01-Jan-1946 (74 y.o. M) Primary Care Shenice Dolder: [name] [name] Clinician: Referring Marquisa Salih: Treating Navarro Nine/Extender: [name] Weeks in Treatment: 6 Wound Status Wound Number: 3 Primary Etiology: Lymphedema Wound Location: Left, Anterior Lower Leg Wound Status: Open Wounding  Event: Blister Date Acquired: 01/15/2020 Weeks Of Treatment: 6 Clustered Wound: No Wound  Measurements Length: (cm) 0.3 Width: (cm) 0.3 Depth: (cm) 0.2 Area: (cm) 0.071 Volume: (cm) 0.014 % Reduction in Area: 94.7% % Reduction in Volume: 99.1% Wound Description Classification: Full Thickness Without Exposed Support Structur es Treatment Notes Wound #3 (Left, Anterior Lower Leg) 1. Cleanse With Soap and water 2. Periwound Care Moisturizing lotion TCA Cream 3. Primary Dressing Applied Collegen AG 4. Secondary Dressing Dry Gauze Drawtex 6. Support Layer Applied 4 layer compression Water quality scientist) Signed: 04/10/2020 11:17:13 AM By: [name] Entered By: [name] on 04/10/2020 11:16:55 -------------------------------------------------------------------------------- Wound Assessment Details Patient Name: Date of Service: [name], [name] 04/10/2020 11:00 A M Medical Record Number: [id number] Patient Account Number: [id number] Date of Birth/Sex: Treating RN: 03/24/1946 (74 y.o. M) Primary Care Valerie Cones: [name] [name] Clinician: Referring Janaisha Tolsma: Treating Rosa Wyly/Extender: [name] Weeks in Treatment: 6 Wound Status Wound Number: 4 Primary Etiology: Lymphedema Wound Location: Left, Proximal, Medial Lower Leg Wound Status: Open Wounding Event: Blister Date Acquired: 01/15/2020 Weeks Of Treatment: 6 Clustered Wound: No Wound Measurements Length: (cm) 0.6 Width: (cm) 0.5 Depth: (cm) 0.1 Area: (cm) 0.236 Volume: (cm) 0.024 % Reduction in Area: 90.6% % Reduction in Volume: 98.9% Wound Description Classification: Full Thickness Without Exposed Support Structur es Treatment Notes Wound #4 (Left, Proximal, Medial Lower Leg) 1. Cleanse With Soap and water 2. Periwound Care Moisturizing lotion TCA Cream 3. Primary Dressing Applied Collegen AG 4. Secondary Dressing Dry Gauze Drawtex 6. Support Layer Applied 4 layer compression Water quality scientist) Signed: 04/10/2020 11:17:13  AM By: [name] Entered By: [name] on 04/10/2020 11:16:55 -------------------------------------------------------------------------------- Vitals Details Patient Name: Date of Service: [name], [name] 04/10/2020 11:00 A M Medical Record Number: [id number] Patient Account Number: [id number] Date of Birth/Sex: Treating RN: August 26, 1945 (74 y.o. M) Primary Care Dorea Duff: [name] [name] Clinician: Referring Morissa Obeirne: Treating Jaskiran Pata/Extender: [name] Weeks in Treatment: 6 Vital Signs Time Taken: 11:16 Temperature (F): 98.3 Pulse (bpm): 101 Respiratory Rate (breaths/min): 18 Blood Pressure (mmHg): 146/59 Reference Range: 80 - 120 mg / dl Electronic Signature(s) Signed: 04/10/2020 11:17:13 AM By: [name] Entered By: [name] on 04/10/2020 11:16:33

## 2020-04-16 NOTE — Progress Notes (Signed)
Longfield, GABE ([id number]) Visit Report for 04/10/2020 SuperBill Details Patient Name: Date of Service: [name], [name] 04/10/2020 Medical Record Number: [id number] Patient Account Number: [id number] Date of Birth/Sex: Treating RN: 01-29-1946 (74 y.o. [name], [name] Primary Care Provider: [name] Other Clinician: Referring Provider: Treating Provider/Extender: Drucilla Schmidt Weeks in Treatment: 6 Diagnosis Coding ICD-10 Codes Code Description I89.0 Lymphedema, not elsewhere classified L97.822 Non-pressure chronic ulcer of other part of left lower leg with fat layer exposed L97.812 Non-pressure chronic ulcer of other part of right lower leg with fat layer exposed Facility Procedures CPT4 Description Modifier Quantity Code 70110034 [id number] BILATERAL: Application of multi-layer venous compression system; leg (below knee), including ankle and 1 foot. Electronic Signature(s) Signed: 04/10/2020 12:27:15 PM By: Rhae Hammock RN Signed: 04/16/2020 4:47:57 PM By: Linton Ham MD Entered By: Rhae Hammock on 04/10/2020 12:27:15

## 2020-04-19 ENCOUNTER — Other Ambulatory Visit: Payer: Self-pay

## 2020-04-19 ENCOUNTER — Encounter (HOSPITAL_BASED_OUTPATIENT_CLINIC_OR_DEPARTMENT_OTHER): Payer: Medicare Other | Attending: Internal Medicine | Admitting: Internal Medicine

## 2020-04-19 DIAGNOSIS — I890 Lymphedema, not elsewhere classified: Secondary | ICD-10-CM | POA: Insufficient documentation

## 2020-04-19 DIAGNOSIS — L97812 Non-pressure chronic ulcer of other part of right lower leg with fat layer exposed: Secondary | ICD-10-CM | POA: Diagnosis not present

## 2020-04-19 DIAGNOSIS — L97822 Non-pressure chronic ulcer of other part of left lower leg with fat layer exposed: Secondary | ICD-10-CM | POA: Insufficient documentation

## 2020-04-20 NOTE — Progress Notes (Addendum)
[name], [name] ([id number]) Visit Report for 04/19/2020 Arrival Information Details Patient Name: Date of Service: [name], [name] 04/19/2020 1:45 PM Medical Record Number: [id number] Patient Account Number: [id number] Date of Birth/Sex: Treating RN: [birth year] (74 y.o. [name] Primary Care [name]: [name] Other Clinician: Referring [name]: Treating [name]/Extender: Drucilla Schmidt Weeks in Treatment: 8 Visit Information History Since Last Visit Added or deleted any medications: No Patient Arrived: Ambulatory Any new allergies or adverse reactions: No Arrival Time: 14:00 Had a fall or experienced change in No Accompanied By: self activities of daily living that may affect Transfer Assistance: None risk of falls: Patient Identification Verified: Yes Signs or symptoms of abuse/neglect since last visito No Secondary Verification Process Completed: Yes Hospitalized since last visit: No Patient Requires Transmission-Based Precautions: No Implantable device outside of the clinic excluding No Patient Has Alerts: Yes cellular tissue based products placed in the center Patient Alerts: Patient on Blood Thinner since last visit: on warfarin Has Dressing in Place as Prescribed: Yes Bilateral ABI: [place] Pain Present Now: No Electronic Signature(s) Signed: 04/20/2020 1:37:58 PM By: [name] Entered By: [name] on 04/19/2020 14:02:34 -------------------------------------------------------------------------------- Compression Therapy Details Patient Name: Date of Service: [name], [name] 04/19/2020 1:45 PM Medical Record Number: [id number] Patient Account Number: [id number] Date of Birth/Sex: Treating RN: 1946/04/15 (74 y.o. [name] Primary Care Yareliz Thorstenson: [name] Other Clinician: Referring Broly Hatfield: Treating Jamieson Lisa/Extender: Drucilla Schmidt Weeks in Treatment: 8 Compression Therapy Performed for  Wound Assessment: Wound #2 Left,Lateral Lower Leg Performed By: Clinician [name] Gouty, RN Compression Type: Four Layer Post Procedure Diagnosis Same as Pre-procedure Electronic Signature(s) Signed: 04/19/2020 5:54:37 PM By: [name] Entered By: [name] on 04/19/2020 17:13:29 -------------------------------------------------------------------------------- Compression Therapy Details Patient Name: Date of Service: [name], [name] 04/19/2020 1:45 PM Medical Record Number: [id number] Patient Account Number: [id number] Date of Birth/Sex: Treating RN: 1945/10/29 (74 y.o. [name] Primary Care Kyllie Pettijohn: [name] Other Clinician: Referring Chadd Tollison: Treating Salayah Meares/Extender: Drucilla Schmidt Weeks in Treatment: 8 Compression Therapy Performed for Wound Assessment: Wound #1 Right,Medial Lower Leg Performed By: Clinician [name] Gouty, RN Compression Type: Four Layer Post Procedure Diagnosis Same as Pre-procedure Electronic Signature(s) Signed: 04/19/2020 5:54:37 PM By: [name] Entered By: [name] on 04/19/2020 17:13:30 -------------------------------------------------------------------------------- Encounter Discharge Information Details Patient Name: Date of Service: [name], [name] 04/19/2020 1:45 PM Medical Record Number: [id number] Patient Account Number: [id number] Date of Birth/Sex: Treating RN: 07-03-1945 (74 y.o. [name] Primary Care Alasia Enge: [name] Other Clinician: Referring Saint Hank: Treating Auda Finfrock/Extender: [name] in Treatment: 8 Encounter Discharge Information Items Discharge Condition: Stable Ambulatory Status: Ambulatory Discharge Destination: Home Transportation: Private Auto Accompanied By: self Schedule Follow-up Appointment: Yes Clinical Summary of Care: Patient Declined Electronic Signature(s) Signed: 04/19/2020 5:52:01 PM By: [name] Gouty  RN, [name] Entered By: [name] on 04/19/2020 17:51:18 -------------------------------------------------------------------------------- Lower Extremity Assessment Details Patient Name: Date of Service: [name], [name] 04/19/2020 1:45 PM Medical Record Number: [id number] Patient Account Number: [id number] Date of Birth/Sex: Treating RN: 10-11-1945 (73 y.o. [name] Primary Care Tamryn Popko: [name] Other Clinician: Referring [name]: Treating [name]/Extender: Drucilla Schmidt Weeks in Treatment: 8 Edema Assessment Assessed: [Left: No] [Right: No] Edema: [Left: Yes] [Right: Yes] Calf Left: Right: Point of Measurement: 46 cm From Medial Instep 47 cm 47 cm Ankle Left: Right: Point of Measurement: 15 cm From Medial Instep 33 cm 35 cm Knee To  Floor Left: Right: From Medial Instep 53 cm 53 cm Electronic Signature(s) Signed: 04/19/2020 4:55:20 PM By: [name] Coria RN Signed: 04/19/2020 5:54:37 PM By: [name] Entered By: [name] on 04/19/2020 14:47:05 -------------------------------------------------------------------------------- Multi Wound Chart Details Patient Name: Date of Service: [name], [name] 04/19/2020 1:45 PM Medical Record Number: [id number] Patient Account Number: [id number] Date of Birth/Sex: Treating RN: 1946/05/13 (74 y.o. [name], [name] Primary Care Broly Hatfield: [name] Other Clinician: Referring Cassidey Barrales: Treating Ralynn San/Extender: Drucilla Schmidt Weeks in Treatment: 8 Vital Signs Height(in): Pulse(bpm): 96 Weight(lbs): Blood Pressure(mmHg): 190/78 Body Mass Index(BMI): Temperature(F): 98.4 Respiratory Rate(breaths/min): 18 Photos: [1:No Photos Right, Medial Lower Leg] [2:No Photos Left, Lateral Lower Leg] [3:No Photos Left, Anterior Lower Leg] Wound Location: [1:Blister] [2:Blister] [3:Blister] Wounding Event: [1:Lymphedema] [2:Lymphedema] [3:Lymphedema] Primary Etiology:  [1:Lymphedema, Deep Vein Thrombosis,] [2:N/A] [3:N/A] Comorbid History: [1:Hypertension, Received Radiation 01/15/2020] [2:01/15/2020] [3:01/15/2020] Date Acquired: [1:8] [2:8] [3:8] Weeks of Treatment: [1:Open] [2:Open] [3:Open] Wound Status: [1:0x0x0] [2:0.5x0.5x1] [3:0x0x0] Measurements L x W x D (cm) [1:0] [2:0.196] [3:0] A (cm) : rea [1:0] [1:9.509] [3:0] Volume (cm) : [1:100.00%] [2:94.30%] [3:100.00%] % Reduction in Area: [1:100.00%] [2:96.20%] [3:100.00%] % Reduction in Volume: [1:Full Thickness Without Exposed] [2:Full Thickness Without Exposed] [3:Full Thickness Without Exposed] Classification: [1:Support Structures None Present] [2:Support Structures N/A] [3:Support Structures N/A] Exudate Amount: [1:None Present (0%)] [2:N/A] [3:N/A] Granulation Amount: [1:None Present (0%)] [2:N/A] [3:N/A] Necrotic Amount: [1:Fascia: No] [2:N/A] [3:N/A] Exposed Structures: [1:Fat Layer (Subcutaneous Tissue): No Tendon: No Muscle: No Joint: No Bone: No Large (67-100%)] [2:N/A] [3:N/A] Wound Number: 4 N/A N/A Photos: No Photos N/A N/A Left, Proximal, Medial Lower Leg N/A N/A Wound Location: Blister N/A N/A Wounding Event: Lymphedema N/A N/A Primary Etiology: N/A N/A N/A Comorbid History: 01/15/2020 N/A N/A Date Acquired: 8 N/A N/A Weeks of Treatment: Open N/A N/A Wound Status: 0x0x0 N/A N/A Measurements L x W x D (cm) 0 N/A N/A A (cm) : rea 0 N/A N/A Volume (cm) : 100.00% N/A N/A % Reduction in Area: 100.00% N/A N/A % Reduction in Volume: Full Thickness Without Exposed N/A N/A Classification: Support Structures N/A N/A N/A Exudate A mount: N/A N/A N/A Granulation A mount: N/A N/A N/A Necrotic A mount: N/A N/A N/A Exposed Structures: N/A N/A N/A Epithelialization: Treatment Notes Electronic Signature(s) Signed: 04/19/2020 5:09:23 PM By: Linton Ham MD Signed: 04/19/2020 5:54:37 PM By: [name] Entered By: Linton Ham on 04/19/2020  15:18:50 -------------------------------------------------------------------------------- Multi-Disciplinary Care Plan Details Patient Name: Date of Service: [name], [name] 04/19/2020 1:45 PM Medical Record Number: [id number] Patient Account Number: [id number] Date of Birth/Sex: Treating RN: 03-Sep-1945 (74 y.o. [name] Primary Care Takyah Ciaramitaro: [name] Other Clinician: Referring Kilea Mccarey: Treating Diavian Furgason/Extender: Drucilla Schmidt Weeks in Treatment: 8 Active Inactive Pain, Acute or Chronic Nursing Diagnoses: Pain, acute or chronic: actual or potential Potential alteration in comfort, pain Goals: Patient will verbalize adequate pain control and receive pain control interventions during procedures as needed Date Initiated: 02/23/2020 Target Resolution Date: 05/18/2020 Goal Status: Active Patient/caregiver will verbalize comfort level met Date Initiated: 02/23/2020 Date Inactivated: 03/22/2020 Target Resolution Date: 03/23/2020 Goal Status: Met Interventions: Provide education on pain management Reposition patient for comfort Notes: Wound/Skin Impairment Nursing Diagnoses: Knowledge deficit related to ulceration/compromised skin integrity Goals: Ulcer/skin breakdown will heal within 14 weeks Date Initiated: 02/23/2020 Target Resolution Date: 06/01/2020 Goal Status: Active Interventions: Assess patient/caregiver ability to obtain necessary supplies Assess patient/caregiver ability to perform ulcer/skin care regimen upon admission and as needed Provide education on  ulcer and skin care Treatment Activities: Skin care regimen initiated : 02/23/2020 Topical wound management initiated : 02/23/2020 Notes: Electronic Signature(s) Signed: 04/19/2020 5:54:37 PM By: [name] Entered By: [name] on 04/19/2020 17:17:48 -------------------------------------------------------------------------------- Non-Wound Condition Assessment  Details Patient Name: Date of Service: [name], [name] 04/19/2020 1:45 PM Medical Record Number: [id number] Patient Account Number: [id number] Date of Birth/Sex: Treating RN: [birth year] (74 y.o. [name] Primary Care Aadi Bordner: [name] Other Clinician: Referring Destanie Tibbetts: Treating [name] Mcclain/Extender: Drucilla Schmidt Weeks in Treatment: 8 Non-Wound Condition: Condition: Lymphedema Location: Leg Side: Right Notes cobblestone appearance. Electronic Signature(s) Signed: 04/19/2020 5:54:37 PM By: [name] Entered By: [name] on 04/19/2020 14:54:35 -------------------------------------------------------------------------------- Pain Assessment Details Patient Name: Date of Service: [name], [name] 04/19/2020 1:45 PM Medical Record Number: [id number] Patient Account Number: [id number] Date of Birth/Sex: Treating RN: July 06, 1945 (74 y.o. [name] Primary Care Sharonlee Nine: [name] Other Clinician: Referring Odaliz Mcqueary: Treating Aston Lawhorn/Extender: Drucilla Schmidt Weeks in Treatment: 8 Active Problems Location of Pain Severity and Description of Pain Patient Has Paino No Site Locations Pain Management and Medication Current Pain Management: Electronic Signature(s) Signed: 04/19/2020 5:54:37 PM By: [name] Signed: 04/20/2020 1:37:58 PM By: [name] Entered By: [name] on 04/19/2020 14:02:59 -------------------------------------------------------------------------------- Patient/Caregiver Education Details Patient Name: Date of Service: [name], [name] 12/2/2021andnbsp1:45 PM Medical Record Number: [id number] Patient Account Number: [id number] Date of Birth/Gender: Treating RN: [birth year] (74 y.o. [name] Primary Care Physician: [name] Other Clinician: Referring Physician: Treating Physician/Extender: [name] in Treatment: 8 Education  Assessment Education Provided To: Patient Education Topics Provided Wound/Skin Impairment: Handouts: Skin Care Do's and Dont's Methods: Explain/Verbal Responses: Reinforcements needed Electronic Signature(s) Signed: 04/19/2020 5:54:37 PM By: [name] Entered By: [name] on 04/19/2020 17:17:59 -------------------------------------------------------------------------------- Wound Assessment Details Patient Name: Date of Service: [name], [name] 04/19/2020 1:45 PM Medical Record Number: [id number] Patient Account Number: [id number] Date of Birth/Sex: Treating RN: 06/08/1945 (74 y.o. [name] Primary Care Lennis Korb: [name] Other Clinician: Referring Sahir Tolson: Treating Akshith Moncus/Extender: Drucilla Schmidt Weeks in Treatment: 8 Wound Status Wound Number: 1 Primary Lymphedema Etiology: Wound Location: Right, Medial Lower Leg Wound Status: Open Wounding Event: Blister Comorbid Lymphedema, Deep Vein Thrombosis, Hypertension, Date Acquired: 01/15/2020 History: Received Radiation Weeks Of Treatment: 8 Clustered Wound: No Photos Wound Measurements Length: (cm) 0.4 Width: (cm) 2.3 Depth: (cm) 0.8 Area: (cm) 0.723 Volume: (cm) 0.578 % Reduction in Area: 7.9% % Reduction in Volume: -631.6% Epithelialization: None Tunneling: No Undermining: No Wound Description Classification: Full Thickness Without Exposed Support Structures Wound Margin: Distinct, outline attached Exudate Amount: Medium Exudate Type: Purulent Exudate Color: yellow, brown, green Foul Odor After Cleansing: No Slough/Fibrino No Wound Bed Granulation Amount: Large (67-100%) Exposed Structure Granulation Quality: Red Fascia Exposed: No Necrotic Amount: None Present (0%) Fat Layer (Subcutaneous Tissue) Exposed: Yes Tendon Exposed: No Muscle Exposed: No Joint Exposed: No Bone Exposed: No Treatment Notes Wound #1 (Lower Leg) Wound Laterality: Right,  Medial Cleanser Peri-Wound Care Triamcinolone 15 (g) Discharge Instruction: Use triamcinolone 15 (g) as directed. MIXED WITH LOTION. Topical Primary Dressing Promogran Prisma Matrix, 4.34 (sq in) (silver collagen) Discharge Instruction: Apply silver collagen to wound bed as instructed. MOISTEN WITH HDYROGEL. Secondary Dressing Woven Gauze Sponge, Non-Sterile 4x4 in Discharge Instruction: Apply over primary dressing as directed. Secured With Compression Wrap FourPress (4 layer compression wrap) Discharge Instruction: Apply four layer compression as directed. APPLY UNNA BOOT FIRST LAYER  TO UPPER PORTION OF LOWER LEG. Compression Stockings Add-Ons Electronic Signature(s) Signed: 04/23/2020 4:25:10 PM By: Mikeal Hawthorne EMT/HBOT/SD Signed: 04/24/2020 5:33:51 PM By: [name] Gouty RN, [name] Previous Signature: 04/19/2020 5:52:01 PM Version By: [name] Gouty RN, [name] Entered By: Mikeal Hawthorne on 04/23/2020 11:56:27 -------------------------------------------------------------------------------- Wound Assessment Details Patient Name: Date of Service: [name], [name] 04/19/2020 1:45 PM Medical Record Number: [id number] Patient Account Number: [id number] Date of Birth/Sex: Treating RN: [birth year] (74 y.o. [name], [name] Primary Care Bobi Daudelin: [name] Other Clinician: Referring Alben Jepsen: Treating Johonna Binette/Extender: Drucilla Schmidt Weeks in Treatment: 8 Wound Status Wound Number: 2 Primary Etiology: Lymphedema Wound Location: Left, Lateral Lower Leg Wound Status: Open Wounding Event: Blister Date Acquired: 01/15/2020 Weeks Of Treatment: 8 Clustered Wound: No Photos Photo Uploaded By: Mikeal Hawthorne on 04/23/2020 09:18:49 Wound Measurements Length: (cm) 0.5 Width: (cm) 0.5 Depth: (cm) 1 Area: (cm) 0.196 Volume: (cm) 0.196 % Reduction in Area: 94.3% % Reduction in Volume: 96.2% Wound Description Classification: Full Thickness Without Exposed  Support Structur es Treatment Notes Wound #2 (Lower Leg) Wound Laterality: Left, Lateral Cleanser Peri-Wound Care Triamcinolone 15 (g) Discharge Instruction: Use triamcinolone 15 (g) as directed. MIXED WITH LOTION. Topical Primary Dressing Promogran Prisma Matrix, 4.34 (sq in) (silver collagen) Discharge Instruction: Apply silver collagen to wound bed as instructed. MOISTEN WITH HDYROGEL. Secondary Dressing Woven Gauze Sponge, Non-Sterile 4x4 in Discharge Instruction: Apply over primary dressing as directed. Secured With Compression Wrap FourPress (4 layer compression wrap) Discharge Instruction: Apply four layer compression as directed. APPLY UNNA BOOT FIRST LAYER TO UPPER PORTION OF LOWER LEG. Compression Stockings Add-Ons Electronic Signature(s) Signed: 04/19/2020 5:54:37 PM By: [name] Signed: 04/20/2020 1:37:58 PM By: [name] Entered By: [name] on 04/19/2020 14:21:59 -------------------------------------------------------------------------------- Wound Assessment Details Patient Name: Date of Service: [name], [name] 04/19/2020 1:45 PM Medical Record Number: [id number] Patient Account Number: [id number] Date of Birth/Sex: Treating RN: April 28, 1946 (74 y.o. [name], [name] Primary Care Zurich Carreno: [name] Other Clinician: Referring Nioma Mccubbins: Treating Marguerite Barba/Extender: Drucilla Schmidt Weeks in Treatment: 8 Wound Status Wound Number: 3 Primary Etiology: Lymphedema Wound Location: Left, Anterior Lower Leg Wound Status: Open Wounding Event: Blister Date Acquired: 01/15/2020 Weeks Of Treatment: 8 Clustered Wound: No Photos Photo Uploaded By: Mikeal Hawthorne on 04/23/2020 09:18:30 Wound Measurements Length: (cm) Width: (cm) Depth: (cm) Area: (cm) Volume: (cm) 0 % Reduction in Area: 100% 0 % Reduction in Volume: 100% 0 0 0 Wound Description Classification: Full Thickness Without Exposed Support  Structur es Electronic Signature(s) Signed: 04/19/2020 5:54:37 PM By: [name] Signed: 04/20/2020 1:37:58 PM By: [name] Entered By: [name] on 04/19/2020 14:21:59 -------------------------------------------------------------------------------- Wound Assessment Details Patient Name: Date of Service: [name], [name] 04/19/2020 1:45 PM Medical Record Number: [id number] Patient Account Number: [id number] Date of Birth/Sex: Treating RN: 04/21/1946 (74 y.o. [name], [name] Primary Care Marteze Vecchio: [name] Other Clinician: Referring Sandra Brents: Treating Knox Holdman/Extender: Drucilla Schmidt Weeks in Treatment: 8 Wound Status Wound Number: 4 Primary Etiology: Lymphedema Wound Location: Left, Proximal, Medial Lower Leg Wound Status: Open Wounding Event: Blister Date Acquired: 01/15/2020 Weeks Of Treatment: 8 Clustered Wound: No Photos Photo Uploaded By: Mikeal Hawthorne on 04/23/2020 09:18:30 Wound Measurements Length: (cm) Width: (cm) Depth: (cm) Area: (cm) Volume: (cm) 0 % Reduction in Area: 100% 0 % Reduction in Volume: 100% 0 0 0 Wound Description Classification: Full Thickness Without Exposed Support Structur es Electronic Signature(s) Signed: 04/19/2020 5:54:37 PM By: [name] Signed: 04/20/2020 1:37:58 PM By:  Dawkins, Destiny Entered By: [name] on 04/19/2020 14:21:59 -------------------------------------------------------------------------------- Vitals Details Patient Name: Date of Service: Witts, [name] 04/19/2020 1:45 PM Medical Record Number: [id number] Patient Account Number: [id number] Date of Birth/Sex: Treating RN: Apr 22, 1946 (74 y.o. [name] Primary Care Roda Lauture: [name] Other Clinician: Referring Reshawn Ostlund: Treating Diedra Sinor/Extender: Drucilla Schmidt Weeks in Treatment: 8 Vital Signs Time Taken: 14:02 Temperature (F): 98.4 Pulse (bpm): 96 Respiratory Rate  (breaths/min): 18 Blood Pressure (mmHg): 190/78 Reference Range: 80 - 120 mg / dl Electronic Signature(s) Signed: 04/20/2020 1:37:58 PM By: [name] Entered By: [name] on 04/19/2020 14:02:52

## 2020-04-20 NOTE — Progress Notes (Signed)
Wailes, MERVIN ([id number]) Visit Report for 04/19/2020 HPI Details Patient Name: Date of Service: Teall, [name] 04/19/2020 1:45 PM Medical Record Number: [id number] Patient Account Number: [id number] Date of Birth/Sex: Treating RN: 1945-10-13 (74 y.o. [name] Primary Care Provider: [name] Other Clinician: Referring Provider: Treating Provider/Extender: Drucilla Schmidt Weeks in Treatment: 8 History of Present Illness HPI Description: ADMISSION 02/23/2020 Patient is a 74 year old man who lives in [place] who arrives accompanied by his wife. He has a history of chronic lymphedema and venous insufficiency in his bilateral lower legs which may have something to do that with having a history of DVT as well as being treated for prostate cancer. In any case he recently got compression pumps at home but compliance has been an issue here. He has compression stockings however they are probably not sufficient enough to control swelling. They tell us that things deteriorated for him in late August he was admitted to [facility] for 7 days. This was with cellulitis I think of his bilateral lower legs. Discharge he was noted to have wounds on his bilateral lower legs. He was discharged on Bactrim. They tried to get him home health through [facility] part C of course they declined him. His wife is been wrapping these applying some form of silver foam dressing. He has a history of wounds before although nothing that would not heal with basic home topical dressings. He has 2 areas on the left medial, left anterior and left lateral and a smaller area on the right medial. All of these have considerable depth. Past medical history includes iron deficiency anemia, lymphedema followed by the rehab center at [facility] with lymphedema wraps I believe, DVT on chronic anticoagulation, prostate cancer, chronic venous insufficiency, hypertension. As mentioned he has  compression pumps but does not use them. ABIs in our clinic were noncompressible bilaterally 10/14; patient with severe bilateral lymphedema right greater than left. He came in with bilateral lower extremity wounds left greater than right. Even though the right side has more of the edema most of the wounds here almost closed on the right medial. He has 3 remaining wounds on the left We have been using silver alginate under 4-layer compression I have been trying to get him to be compliant with his external compression pumps 10/21; patient with 3 small wounds on the left leg and 1 on the right medial in the setting of severe lymphedema and chronic venous insufficiency. We have been using silver alginate under 4-layer compression he is using his external compression pumps twice a day 11/4; ARTERIAL STUDIES on the right show an ABI of 1.02 TBI of 0.858 with biphasic waveforms on the left 0.98 with a TBI of 0.55 and biphasic waveforms. Does not look like he has significant arterial disease. We are treating him for lymphedema he has compression pumps. He has punched-out areas on the left anterior left lateral and right medial lower extremities 11/11; after we obtained his arterial studies I put him in 4 layer compression. He is using his compression pumps probably once a day although I have asked him to do twice. Primary dressing to the wound is silver collagen he has severe lymphedema likely secondary to chronic venous insufficiency. Wounds on the left lateral, left medial and left anterior and a small area on the right medial 12/2; the area on the right anterior lower leg has healed. We initially thought that the area medially had healed as well however when  her discharge nurse came in she detected fluid in the wound simply opened up. This is actually worse than I remember this pain. The area on the left lateral potentially slightly smaller He is also complaining about pain in his left hand he says that  this is actually been getting some better he has been using topical creams on this. She asked that I look at this Electronic Signature(s) Signed: 04/19/2020 5:09:23 PM By: Linton Ham MD Entered By: Linton Ham on 04/19/2020 15:34:41 -------------------------------------------------------------------------------- Physical Exam Details Patient Name: Date of Service: Currey, [name] 04/19/2020 1:45 PM Medical Record Number: [id number] Patient Account Number: [id number] Date of Birth/Sex: Treating RN: [birth year] (74 y.o. [name] Primary Care Provider: [name] Other Clinician: Referring Provider: Treating Provider/Extender: Drucilla Schmidt Weeks in Treatment: 8 Constitutional Patient is hypertensive.. Pulse regular and within target range for patient.Marland Kitchen Respirations regular, non-labored and within target range.. Temperature is normal and within the target range for the patient.Marland Kitchen Appears in no distress. Musculoskeletal The patient has a monoarthritis of the left wrist. Fluid in the joint and erythema over the top of this this is not cellulitis. Notes Wound exam; we thought we had the right medial area healed however apparently fluid was detected by her discharge nurse this is actually wide open and worse than what I remember. It does not appear to be infected. The area on the left lateral is smaller but still a punched-out area with some depth. Electronic Signature(s) Signed: 04/19/2020 5:09:23 PM By: Linton Ham MD Entered By: Linton Ham on 04/19/2020 15:35:15 -------------------------------------------------------------------------------- Physician Orders Details Patient Name: Date of Service: Steck, [name] 04/19/2020 1:45 PM Medical Record Number: [id number] Patient Account Number: [id number] Date of Birth/Sex: Treating RN: [birth year] (74 y.o. [name] Primary Care Provider: [name] Other Clinician: Referring  Provider: Treating Provider/Extender: Drucilla Schmidt Weeks in Treatment: 8 Verbal / Phone Orders: No Diagnosis Coding ICD-10 Coding Code Description I89.0 Lymphedema, not elsewhere classified L97.822 Non-pressure chronic ulcer of other part of left lower leg with fat layer exposed L97.812 Non-pressure chronic ulcer of other part of right lower leg with fat layer exposed Follow-up Appointments Return Appointment in 1 week. Bathing/ Shower/ Hygiene May shower with protection but do not get wound dressing(s) wet. - use a cast protector. Edema Control - Lymphedema / SCD / Other Bilateral Lower Extremities Lymphedema Pumps. Use Lymphedema pumps on leg(s) 2-3 times a day for 45-60 minutes. If wearing any wraps or hose, do not remove them. Continue exercising as instructed. Elevate legs to the level of the heart or above for 30 minutes daily and/or when sitting, a frequency of: - 3-4 times Avoid standing for long periods of time. Exercise regularly Other Edema Control Orders/Instructions: - *** Patient to order compression garments and bring in next week.*** Wound Treatment Wound #1 - Lower Leg Wound Laterality: Right, Medial Peri-Wound Care: Triamcinolone 15 (g) Other:CHANGE WEEKLY AT WOUND CENTER./7 Days Discharge Instructions: Use triamcinolone 15 (g) as directed. MIXED WITH LOTION. Prim Dressing: Promogran Prisma Matrix, 4.34 (sq in) (silver collagen) Other:CHANGE WEEKLY AT WOUND CENTER./7 Days ary Discharge Instructions: Apply silver collagen to wound bed as instructed. MOISTEN WITH HDYROGEL. Secondary Dressing: Woven Gauze Sponge, Non-Sterile 4x4 in Other:CHANGE WEEKLY AT WOUND CENTER./7 Days Discharge Instructions: Apply over primary dressing as directed. Compression Wrap: FourPress (4 layer compression wrap) Other:CHANGE WEEKLY AT WOUND CENTER./7 Days Discharge Instructions: Apply four layer compression as directed. APPLY UNNA BOOT FIRST LAYER TO UPPER PORTION  OF  LOWER LEG. Electronic Signature(s) Signed: 04/19/2020 5:54:37 PM By: Deon Pilling Signed: 04/20/2020 12:44:20 PM By: Linton Ham MD Entered By: Deon Pilling on 04/19/2020 17:17:20 -------------------------------------------------------------------------------- Problem List Details Patient Name: Date of Service: Lattanzio, [name] 04/19/2020 1:45 PM Medical Record Number: [id number] Patient Account Number: [id number] Date of Birth/Sex: Treating RN: 09/27/1945 (74 y.o. [name], [name] Primary Care Provider: [name] Other Clinician: Referring Provider: Treating Provider/Extender: Drucilla Schmidt Weeks in Treatment: 8 Active Problems ICD-10 Encounter Code Description Active Date MDM Diagnosis I89.0 Lymphedema, not elsewhere classified 02/23/2020 No Yes L97.822 Non-pressure chronic ulcer of other part of left lower leg with fat layer exposed10/11/2019 No Yes L97.812 Non-pressure chronic ulcer of other part of right lower leg with fat layer 02/23/2020 No Yes exposed Inactive Problems Resolved Problems Electronic Signature(s) Signed: 04/19/2020 5:09:23 PM By: Linton Ham MD Entered By: Linton Ham on 04/19/2020 15:18:27 -------------------------------------------------------------------------------- Progress Note Details Patient Name: Date of Service: Lorge, [name] 04/19/2020 1:45 PM Medical Record Number: [id number] Patient Account Number: [id number] Date of Birth/Sex: Treating RN: [birth year] (74 y.o. [name] Primary Care Provider: [name] Other Clinician: Referring Provider: Treating Provider/Extender: Drucilla Schmidt Weeks in Treatment: 8 Subjective History of Present Illness (HPI) ADMISSION 02/23/2020 Patient is a 74 year old man who lives in [place] who arrives accompanied by his wife. He has a history of chronic lymphedema and venous insufficiency in his bilateral lower legs which may have something  to do that with having a history of DVT as well as being treated for prostate cancer. In any case he recently got compression pumps at home but compliance has been an issue here. He has compression stockings however they are probably not sufficient enough to control swelling. They tell us that things deteriorated for him in late August he was admitted to [facility] for 7 days. This was with cellulitis I think of his bilateral lower legs. Discharge he was noted to have wounds on his bilateral lower legs. He was discharged on Bactrim. They tried to get him home health through [facility] part [facility] of course they declined him. His wife is been wrapping these applying some form of silver foam dressing. He has a history of wounds before although nothing that would not heal with basic home topical dressings. He has 2 areas on the left medial, left anterior and left lateral and a smaller area on the right medial. All of these have considerable depth. Past medical history includes iron deficiency anemia, lymphedema followed by the rehab center at [facility] with lymphedema wraps I believe, DVT on chronic anticoagulation, prostate cancer, chronic venous insufficiency, hypertension. As mentioned he has compression pumps but does not use them. ABIs in our clinic were noncompressible bilaterally 10/14; patient with severe bilateral lymphedema right greater than left. He came in with bilateral lower extremity wounds left greater than right. Even though the right side has more of the edema most of the wounds here almost closed on the right medial. He has 3 remaining wounds on the left We have been using silver alginate under 4-layer compression I have been trying to get him to be compliant with his external compression pumps 10/21; patient with 3 small wounds on the left leg and 1 on the right medial in the setting of severe lymphedema and chronic venous insufficiency. We have been using silver alginate  under 4-layer compression he is using his external compression pumps twice a day 11/4; ARTERIAL  STUDIES on the right show an ABI of 1.02 TBI of 0.858 with biphasic waveforms on the left 0.98 with a TBI of 0.55 and biphasic waveforms. Does not look like he has significant arterial disease. We are treating him for lymphedema he has compression pumps. He has punched-out areas on the left anterior left lateral and right medial lower extremities 11/11; after we obtained his arterial studies I put him in 4 layer compression. He is using his compression pumps probably once a day although I have asked him to do twice. Primary dressing to the wound is silver collagen he has severe lymphedema likely secondary to chronic venous insufficiency. Wounds on the left lateral, left medial and left anterior and a small area on the right medial 12/2; the area on the right anterior lower leg has healed. We initially thought that the area medially had healed as well however when her discharge nurse came in she detected fluid in the wound simply opened up. This is actually worse than I remember this pain. The area on the left lateral potentially slightly smaller He is also complaining about pain in his left hand he says that this is actually been getting some better he has been using topical creams on this. She asked that I look at this Objective Constitutional Patient is hypertensive.. Pulse regular and within target range for patient.Marland Kitchen Respirations regular, non-labored and within target range.. Temperature is normal and within the target range for the patient.Marland Kitchen Appears in no distress. Vitals Time Taken: 2:02 PM, Temperature: 98.4 F, Pulse: 96 bpm, Respiratory Rate: 18 breaths/min, Blood Pressure: 190/78 mmHg. Musculoskeletal The patient has a monoarthritis of the left wrist. Fluid in the joint and erythema over the top of this this is not cellulitis. General Notes: Wound exam; we thought we had the right medial area  healed however apparently fluid was detected by her discharge nurse this is actually wide open and worse than what I remember. It does not appear to be infected. ooThe area on the left lateral is smaller but still a punched-out area with some depth. Integumentary (Hair, Skin) Wound #1 status is Open. Original cause of wound was Blister. The wound is located on the Right,Medial Lower Leg. The wound measures 0.4cm length x 2.3cm width x 0.8cm depth; 0.723cm^2 area and 0.578cm^3 volume. There is Fat Layer (Subcutaneous Tissue) exposed. There is no tunneling or undermining noted. There is a medium amount of purulent drainage noted. The wound margin is distinct with the outline attached to the wound base. There is large (67-100%) red granulation within the wound bed. There is no necrotic tissue within the wound bed. Wound #2 status is Open. Original cause of wound was Blister. The wound is located on the Left,Lateral Lower Leg. The wound measures 0.5cm length x 0.5cm width x 1cm depth; 0.196cm^2 area and 0.196cm^3 volume. Wound #3 status is Open. Original cause of wound was Blister. The wound is located on the Left,Anterior Lower Leg. The wound measures 0cm length x 0cm width x 0cm depth; 0cm^2 area and 0cm^3 volume. Wound #4 status is Open. Original cause of wound was Blister. The wound is located on the Left,Proximal,Medial Lower Leg. The wound measures 0cm length x 0cm width x 0cm depth; 0cm^2 area and 0cm^3 volume. Assessment Active Problems ICD-10 Lymphedema, not elsewhere classified Non-pressure chronic ulcer of other part of left lower leg with fat layer exposed Non-pressure chronic ulcer of other part of right lower leg with fat layer exposed Procedures Wound #1 Pre-procedure diagnosis of  Wound #1 is a Lymphedema located on the Right,Medial Lower Leg . There was a Four Layer Compression Therapy Procedure by Baruch Gouty, RN. Post procedure Diagnosis Wound #1: Same as  Pre-Procedure Wound #2 Pre-procedure diagnosis of Wound #2 is a Lymphedema located on the Left,Lateral Lower Leg . There was a Four Layer Compression Therapy Procedure by Baruch Gouty, RN. Post procedure Diagnosis Wound #2: Same as Pre-Procedure Plan Follow-up Appointments: Return Appointment in 1 week. Bathing/ Shower/ Hygiene: May shower with protection but do not get wound dressing(s) wet. - use a cast protector. Edema Control - Lymphedema / SCD / Other: Lymphedema Pumps. Use Lymphedema pumps on leg(s) 2-3 times a day for 45-60 minutes. If wearing any wraps or hose, do not remove them. Continue exercising as instructed. Elevate legs to the level of the heart or above for 30 minutes daily and/or when sitting, a frequency of: - 3-4 times Avoid standing for long periods of time. Exercise regularly Other Edema Control Orders/Instructions: - *** Patient to order compression garments and bring in next week.*** WOUND #1: - Lower Leg Wound Laterality: Right, Medial Peri-Wound Care: Triamcinolone 15 (g) Other:CHANGE WEEKLY AT WOUND CENTER./7 Days Discharge Instructions: Use triamcinolone 15 (g) as directed. MIXED WITH LOTION. Prim Dressing: Promogran Prisma Matrix, 4.34 (sq in) (silver collagen) Other:CHANGE WEEKLY AT WOUND CENTER./7 Days ary Discharge Instructions: Apply silver collagen to wound bed as instructed. MOISTEN WITH HDYROGEL. Secondary Dressing: Woven Gauze Sponge, Non-Sterile 4x4 in Other:CHANGE WEEKLY AT WOUND CENTER./7 Days Discharge Instructions: Apply over primary dressing as directed. Com pression Wrap: FourPress (4 layer compression wrap) Other:CHANGE WEEKLY AT WOUND CENTER./7 Days Discharge Instructions: Apply four layer compression as directed. APPLY UNNA BOOT FIRST LAYER TO UPPER PORTION OF LOWER LEG. 1. We use silver collagen to both wound areas. 2. 4 layer compression 3. Using his external compression pumps. 4. He does not have the best edema control. 5. No  evidence of infection 6. 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 Furthermore he says this is getting better perhaps a intra-articular steroid shot would help although that on Coumadin this would be dangerous for. I am not sure what the etiology of this is either Electronic Signature(s) Signed: 04/19/2020 5:54:37 PM By: Deon Pilling Signed: 04/20/2020 12:44:20 PM By: Linton Ham MD Previous Signature: 04/19/2020 5:09:23 PM Version By: Linton Ham MD Entered By: Deon Pilling on 04/19/2020 17:18:13 -------------------------------------------------------------------------------- SuperBill Details Patient Name: Date of Service: Roark, [name] 04/19/2020 Medical Record Number: [id number] Patient Account Number: [id number] Date of Birth/Sex: Treating RN: 03-07-1946 (74 y.o. [name], [name] Primary Care Provider: [name] Other Clinician: Referring Provider: Treating Provider/Extender: Drucilla Schmidt Weeks in Treatment: 8 Diagnosis Coding ICD-10 Codes Code Description I89.0 Lymphedema, not elsewhere classified L97.822 Non-pressure chronic ulcer of other part of left lower leg with fat layer exposed L97.812 Non-pressure chronic ulcer of other part of right lower leg with fat layer exposed Facility Procedures CPT4: Code [id number] Description: 81 BILATERAL: Application of multi-layer venous compression system; leg (below knee), including ankle and t. Modifier: Quantity: 1 Physician Procedures : CPT4 Code Description Modifier [id number] - WC PHYS LEVEL 2 - EST PT ICD-10 Diagnosis Description L97.822 Non-pressure chronic ulcer of other part of left lower leg with fat layer exposed L97.812 Non-pressure chronic ulcer of other part of right  lower leg with fat layer exposed I89.0  Lymphedema, not elsewhere classified Quantity: 1 Electronic  Signature(s) Signed: 04/19/2020 5:54:37 PM By: Deon Pilling Signed: 04/20/2020 12:44:20 PM By: Linton Ham MD Previous Signature: 04/19/2020 5:09:23 PM Version By: Linton Ham MD Entered By: Deon Pilling on 04/19/2020 17:18:25

## 2020-04-26 ENCOUNTER — Other Ambulatory Visit: Payer: Self-pay

## 2020-04-26 ENCOUNTER — Encounter (HOSPITAL_BASED_OUTPATIENT_CLINIC_OR_DEPARTMENT_OTHER): Payer: Medicare Other | Admitting: Internal Medicine

## 2020-04-26 DIAGNOSIS — L97822 Non-pressure chronic ulcer of other part of left lower leg with fat layer exposed: Secondary | ICD-10-CM | POA: Diagnosis not present

## 2020-04-26 NOTE — Progress Notes (Signed)
[name] ([id number]) Visit Report for 04/26/2020 Debridement Details Patient Name: Date of Service: [name], [name] 04/26/2020 12:45 PM Medical Record Number: [id number] Patient Account Number: [id number] Date of Birth/Sex: Treating RN: 18-Aug-1945 (74 y.o. [name] Primary Care Provider: [name] Other Clinician: Referring Provider: Treating Provider/Extender: Drucilla Schmidt Weeks in Treatment: 9 Debridement Performed for Assessment: Wound #1 Right,Medial Lower Leg Performed By: Physician Ricard Dillon., MD Debridement Type: Debridement Level of Consciousness (Pre-procedure): Awake and Alert Pre-procedure Verification/Time Out Yes - 13:15 Taken: Start Time: 13:15 Pain Control: Lidocaine 4% T opical Solution T Area Debrided (L x W): otal 0.7 (cm) x 2 (cm) = 1.4 (cm) Tissue and other material debrided: Viable, Non-Viable, Slough, Subcutaneous, Skin: Dermis , Skin: Epidermis, Fibrin/Exudate, Slough Level: Skin/Subcutaneous Tissue Debridement Description: Excisional Instrument: Curette Bleeding: Moderate Hemostasis Achieved: Pressure End Time: 13:17 Procedural Pain: 0 Post Procedural Pain: 0 Response to Treatment: Procedure was tolerated well Level of Consciousness (Post- Awake and Alert procedure): Post Debridement Measurements of Total Wound Length: (cm) 0.7 Width: (cm) 2 Depth: (cm) 0.9 Volume: (cm) 0.99 Character of Wound/Ulcer Post Debridement: Improved Post Procedure Diagnosis Same as Pre-procedure Electronic Signature(s) Signed: 04/26/2020 3:37:20 PM By: Linton Ham MD Signed: 04/26/2020 4:11:46 PM By: Deon Pilling Entered By: Linton Ham on 04/26/2020 13:21:08 -------------------------------------------------------------------------------- HPI Details Patient Name: Date of Service: [name], [name] 04/26/2020 12:45 PM Medical Record Number: [id number] Patient Account Number: [id number] Date of Birth/Sex: Treating  RN: Jul 05, 1945 (74 y.o. [name] Primary Care Provider: [name] Other Clinician: Referring Provider: Treating Provider/Extender: Drucilla Schmidt Weeks in Treatment: 9 History of Present Illness HPI Description: ADMISSION 02/23/2020 Patient is a 74 year old man who lives in [place] who arrives accompanied by his wife. He has a history of chronic lymphedema and venous insufficiency in his bilateral lower legs which may have something to do that with having a history of DVT as well as being treated for prostate cancer. In any case he recently got compression pumps at home but compliance has been an issue here. He has compression stockings however they are probably not sufficient enough to control swelling. They tell us that things deteriorated for him in late August he was admitted to [facility] for 7 days. This was with cellulitis I think of his bilateral lower legs. Discharge he was noted to have wounds on his bilateral lower legs. He was discharged on Bactrim. They tried to get him home health through [facility] of course they declined him. His wife is been wrapping these applying some form of silver foam dressing. He has a history of wounds before although nothing that would not heal with basic home topical dressings. He has 2 areas on the left medial, left anterior and left lateral and a smaller area on the right medial. All of these have considerable depth. Past medical history includes iron deficiency anemia, lymphedema followed by the rehab center at [facility] with lymphedema wraps I believe, DVT on chronic anticoagulation, prostate cancer, chronic venous insufficiency, hypertension. As mentioned he has compression pumps but does not use them. ABIs in our clinic were noncompressible bilaterally 10/14; patient with severe bilateral lymphedema right greater than left. He came in with bilateral lower extremity wounds left greater than  right. Even though the right side has more of the edema most of the wounds here almost closed on the right medial. He has 3 remaining wounds on the left We have been  using silver alginate under 4-layer compression I have been trying to get him to be compliant with his external compression pumps 10/21; patient with 3 small wounds on the left leg and 1 on the right medial in the setting of severe lymphedema and chronic venous insufficiency. We have been using silver alginate under 4-layer compression he is using his external compression pumps twice a day 11/4; ARTERIAL STUDIES on the right show an ABI of 1.02 TBI of 0.858 with biphasic waveforms on the left 0.98 with a TBI of 0.55 and biphasic waveforms. Does not look like he has significant arterial disease. We are treating him for lymphedema he has compression pumps. He has punched-out areas on the left anterior left lateral and right medial lower extremities 11/11; after we obtained his arterial studies I put him in 4 layer compression. He is using his compression pumps probably once a day although I have asked him to do twice. Primary dressing to the wound is silver collagen he has severe lymphedema likely secondary to chronic venous insufficiency. Wounds on the left lateral, left medial and left anterior and a small area on the right medial 12/2; the area on the right anterior lower leg has healed. We initially thought that the area medially had healed as well however when her discharge nurse came in she detected fluid in the wound simply opened up. This is actually worse than I remember this pain. The area on the left lateral potentially slightly smaller He is also complaining about pain in his left hand he says that this is actually been getting some better he has been using topical creams on this. She asked that I look at this 12/9 after last weeks issues we have 2 wounds one on the right medial lower leg and 1 on the left lateral. Both of  these are in the same condition. I think because of thickened skin secondary to chronic lymphedema these wounds actually have depth of almost 0.8 cm. Electronic Signature(s) Signed: 04/26/2020 3:37:20 PM By: Linton Ham MD Entered By: Linton Ham on 04/26/2020 13:22:08 -------------------------------------------------------------------------------- Physical Exam Details Patient Name: Date of Service: [name], [name] 04/26/2020 12:45 PM Medical Record Number: [id number] Patient Account Number: [id number] Date of Birth/Sex: Treating RN: 1945/11/17 (74 y.o. [name] Primary Care Provider: [name] Other Clinician: Referring Provider: Treating Provider/Extender: Drucilla Schmidt Weeks in Treatment: 9 Constitutional Patient is hypertensive.. Pulse regular and within target range for patient.Marland Kitchen Respirations regular, non-labored and within target range.. Temperature is normal and within the target range for the patient.Marland Kitchen Appears in no distress. Notes Wound exam; both the wounds are in the same condition. They are generally small but with significant depth. On the right we had debris on the wound surface which I removed with a #3 curette hemostasis with direct pressure. Pedal pulses are palpable. His edema control is marginal Electronic Signature(s) Signed: 04/26/2020 3:37:20 PM By: Linton Ham MD Entered By: Linton Ham on 04/26/2020 13:24:27 -------------------------------------------------------------------------------- Physician Orders Details Patient Name: Date of Service: [name], [name] 04/26/2020 12:45 PM Medical Record Number: [id number] Patient Account Number: [id number] Date of Birth/Sex: Treating RN: 1946/01/31 (74 y.o. [name] Primary Care Provider: [name] Other Clinician: Referring Provider: Treating Provider/Extender: Drucilla Schmidt Weeks in Treatment: 9 Verbal / Phone Orders: No Diagnosis  Coding Follow-up Appointments Return Appointment in 1 week. Bathing/ Shower/ Hygiene May shower with protection but do not get wound dressing(s) wet. - use a cast protector. Edema  Control - Lymphedema / SCD / Other Bilateral Lower Extremities Lymphedema Pumps. Use Lymphedema pumps on leg(s) 2-3 times a day for 45-60 minutes. If wearing any wraps or hose, do not remove them. Continue exercising as instructed. Elevate legs to the level of the heart or above for 30 minutes daily and/or when sitting, a frequency of: - 3-4 times Avoid standing for long periods of time. Exercise regularly Other Edema Control Orders/Instructions: - *** Patient to order compression garments and bring in next week.*** Wound Treatment Wound #1 - Lower Leg Wound Laterality: Right, Medial Peri-Wound Care: Triamcinolone 15 (g) Other:CHANGE WEEKLY AT WOUND CENTER./7 Days Discharge Instructions: Use triamcinolone 15 (g) as directed. MIXED WITH LOTION. Prim Dressing: Promogran Prisma Matrix, 4.34 (sq in) (silver collagen) Other:CHANGE WEEKLY AT WOUND CENTER./7 Days ary Discharge Instructions: Apply silver collagen to wound bed as instructed. MOISTEN WITH HDYROGEL. Secondary Dressing: Woven Gauze Sponge, Non-Sterile 4x4 in Other:CHANGE WEEKLY AT WOUND CENTER./7 Days Discharge Instructions: Apply over primary dressing as directed. Secondary Dressing: Drawtex 4x4 in [place] [facility]./7 Days Discharge Instructions: Back prisma with the drawtex. Apply over primary dressing as directed. Compression Wrap: FourPress (4 layer compression wrap) Other:CHANGE WEEKLY AT WOUND CENTER./7 Days Discharge Instructions: Apply four layer compression as directed. APPLY UNNA BOOT FIRST LAYER TO UPPER PORTION OF LOWER LEG. Wound #2 - Lower Leg Wound Laterality: Left, Lateral Peri-Wound Care: Triamcinolone 15 (g) Other:CHANGE WEEKLY AT WOUND CENTER./7 Days Discharge Instructions: Use triamcinolone 15 (g) as directed. MIXED  WITH LOTION. Prim Dressing: Promogran Prisma Matrix, 4.34 (sq in) (silver collagen) Other:CHANGE WEEKLY AT WOUND CENTER./7 Days ary Discharge Instructions: Apply silver collagen to wound bed as instructed. MOISTEN WITH HDYROGEL. Secondary Dressing: Woven Gauze Sponge, Non-Sterile 4x4 in Other:CHANGE WEEKLY AT WOUND CENTER./7 Days Discharge Instructions: Apply over primary dressing as directed. Secondary Dressing: Drawtex 4x4 in [place] [facility]./7 Days Discharge Instructions: Back prisma with the drawtex. Apply over primary dressing as directed. Compression Wrap: FourPress (4 layer compression wrap) Other:CHANGE WEEKLY AT WOUND CENTER./7 Days Discharge Instructions: Apply four layer compression as directed. APPLY UNNA BOOT FIRST LAYER TO UPPER PORTION OF LOWER LEG. Electronic Signature(s) Signed: 04/26/2020 1:29:24 PM By: Rhae Hammock RN Signed: 04/26/2020 3:37:20 PM By: Linton Ham MD Entered By: Rhae Hammock on 04/26/2020 13:29:23 -------------------------------------------------------------------------------- Problem List Details Patient Name: Date of Service: [name], [name] 04/26/2020 12:45 PM Medical Record Number: [id number] Patient Account Number: [id number] Date of Birth/Sex: Treating RN: 02/25/1946 (74 y.o. 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 [name] Primary Care Provider: [name] Other Clinician: Referring Provider: Treating Provider/Extender: Drucilla Schmidt Weeks in Treatment: 9 Subjective History of Present Illness (HPI) ADMISSION 02/23/2020 Patient is a 74 year old man who lives in [place] who arrives accompanied by his wife.  He has a history of chronic lymphedema and venous insufficiency in his bilateral lower legs which may have something to do that with having a history of DVT as well as being treated for prostate cancer. In any case he recently got compression pumps at home but compliance has been an issue here. He has compression stockings however they are probably not sufficient enough to control swelling. They tell us that things deteriorated for him in late August he was admitted to [facility] for 7 days. This was with cellulitis I think of his bilateral lower legs. Discharge he was noted to have wounds on his bilateral lower legs. He was discharged on Bactrim. They tried to get him home health through [facility] part C of course they declined him. His wife is been wrapping these applying some form of silver foam dressing. He has a history of wounds before although nothing that would not heal with basic home topical dressings. He has 2 areas on the left medial, left anterior and left lateral and a smaller area on the right medial. All of these have considerable depth. Past medical history includes iron deficiency anemia, lymphedema followed by the rehab center at [facility] with lymphedema wraps I believe, DVT on chronic  anticoagulation, prostate cancer, chronic venous insufficiency, hypertension. As mentioned he has compression pumps but does not use them. ABIs in our clinic were noncompressible bilaterally 10/14; patient with severe bilateral lymphedema right greater than left. He came in with bilateral lower extremity wounds left greater than right. Even though the right side has more of the edema most of the wounds here almost closed on the right medial. He has 3 remaining wounds on the left We have been using silver alginate under 4-layer compression I have been trying to get him to be compliant with his external compression pumps 10/21; patient with 3 small wounds on the left leg and 1 on the right medial in the setting of severe lymphedema and chronic venous insufficiency. We have been using silver alginate under 4-layer compression he is using his external compression pumps twice a day 11/4; ARTERIAL STUDIES on the right show an ABI of 1.02 TBI of 0.858 with biphasic waveforms on the left 0.98 with a TBI of 0.55 and biphasic waveforms. Does not look like he has significant arterial disease. We are treating him for lymphedema he has compression pumps. He has punched-out areas on the left anterior left lateral and right medial lower extremities 11/11; after we obtained his arterial studies I put him in 4 layer compression. He is using his compression pumps probably once a day although I have asked him to do twice. Primary dressing to the wound is silver collagen he has severe lymphedema likely secondary to chronic venous insufficiency. Wounds on the left lateral, left medial and left anterior and a small area on the right medial 12/2; the area on the right anterior lower leg has healed. We initially thought that the area medially had healed as well however when her discharge nurse came in she detected fluid in the wound simply opened up. This is actually worse than I remember this pain. The area on the left  lateral potentially slightly smaller He is also complaining about pain in his left hand he says that this is actually been getting some better he has been using topical creams on this. She asked that I look at this 12/9 after last weeks issues we have 2 wounds one on the right medial lower  leg and 1 on the left lateral. Both of these are in the same condition. I think because of thickened skin secondary to chronic lymphedema these wounds actually have depth of almost 0.8 cm. Objective Constitutional Patient is hypertensive.. Pulse regular and within target range for patient.Marland Kitchen Respirations regular, non-labored and within target range.. Temperature is normal and within the target range for the patient.Marland Kitchen Appears in no distress. Vitals Time Taken: 12:54 PM, Temperature: 98.3 F, Pulse: 90 bpm, Respiratory Rate: 18 breaths/min, Blood Pressure: 177/80 mmHg. General Notes: Wound exam; both the wounds are in the same condition. They are generally small but with significant depth. On the right we had debris on the wound surface which I removed with a #3 curette hemostasis with direct pressure. Pedal pulses are palpable. His edema control is marginal Integumentary (Hair, Skin) Wound #1 status is Open. Original cause of wound was Blister. The wound is located on the Right,Medial Lower Leg. The wound measures 0.7cm length x 2cm width x 0.9cm depth; 1.1cm^2 area and 0.99cm^3 volume. There is Fat Layer (Subcutaneous Tissue) exposed. There is no tunneling or undermining noted. There is a medium amount of serosanguineous drainage noted. The wound margin is distinct with the outline attached to the wound base. There is medium (34- 66%) red, pink granulation within the wound bed. There is a medium (34-66%) amount of necrotic tissue within the wound bed including Adherent Slough. Wound #2 status is Open. Original cause of wound was Blister. The wound is located on the Left,Lateral Lower Leg. The wound measures 0.2cm  length x 0.3cm width x 0.8cm depth; 0.047cm^2 area and 0.038cm^3 volume. There is Fat Layer (Subcutaneous Tissue) exposed. There is no tunneling or undermining noted. There is a small amount of serous drainage noted. The wound margin is flat and intact. There is small (1-33%) pink, pale granulation within the wound bed. There is a large (67-100%) amount of necrotic tissue within the wound bed including Adherent Slough. Assessment Active Problems ICD-10 Lymphedema, not elsewhere classified Non-pressure chronic ulcer of other part of left lower leg with fat layer exposed Non-pressure chronic ulcer of other part of right lower leg with fat layer exposed Procedures Wound #1 Pre-procedure diagnosis of Wound #1 is a Lymphedema located on the Right,Medial Lower Leg . There was a Excisional Skin/Subcutaneous Tissue Debridement with a total area of 1.4 sq cm performed by Ricard Dillon., MD. With the following instrument(s): Curette to remove Viable and Non-Viable tissue/material. Material removed includes Subcutaneous Tissue, Slough, Skin: Dermis, Skin: Epidermis, and Fibrin/Exudate after achieving pain control using Lidocaine 4% T opical Solution. No specimens were taken. A time out was conducted at 13:15, prior to the start of the procedure. A Moderate amount of bleeding was controlled with Pressure. The procedure was tolerated well with a pain level of 0 throughout and a pain level of 0 following the procedure. Post Debridement Measurements: 0.7cm length x 2cm width x 0.9cm depth; 0.99cm^3 volume. Character of Wound/Ulcer Post Debridement is improved. Post procedure Diagnosis Wound #1: Same as Pre-Procedure Pre-procedure diagnosis of Wound #1 is a Lymphedema located on the Right,Medial Lower Leg . There was a Four Layer Compression Therapy Procedure by Rhae Hammock, RN. Post procedure Diagnosis Wound #1: Same as Pre-Procedure Wound #2 Pre-procedure diagnosis of Wound #2 is a Lymphedema  located on the Left,Lateral Lower Leg . There was a Four Layer Compression Therapy Procedure by Rhae Hammock, RN. Post procedure Diagnosis Wound #2: Same as Pre-Procedure Plan Follow-up Appointments: Return Appointment in 1 week.  Bathing/ Shower/ Hygiene: May shower with protection but do not get wound dressing(s) wet. - use a cast protector. Edema Control - Lymphedema / SCD / Other: Lymphedema Pumps. Use Lymphedema pumps on leg(s) 2-3 times a day for 45-60 minutes. If wearing any wraps or hose, do not remove them. Continue exercising as instructed. Elevate legs to the level of the heart or above for 30 minutes daily and/or when sitting, a frequency of: - 3-4 times Avoid standing for long periods of time. Exercise regularly Other Edema Control Orders/Instructions: - *** Patient to order compression garments and bring in next week.*** WOUND #1: - Lower Leg Wound Laterality: Right, Medial Peri-Wound Care: Triamcinolone 15 (g) Other:CHANGE WEEKLY AT WOUND CENTER./7 Days Discharge Instructions: Use triamcinolone 15 (g) as directed. MIXED WITH LOTION. Prim Dressing: Promogran Prisma Matrix, 4.34 (sq in) (silver collagen) Other:CHANGE WEEKLY AT WOUND CENTER./7 Days ary Discharge Instructions: Apply silver collagen to wound bed as instructed. MOISTEN WITH HDYROGEL. Secondary Dressing: Woven Gauze Sponge, Non-Sterile 4x4 in Other:CHANGE WEEKLY AT WOUND CENTER./7 Days Discharge Instructions: Apply over primary dressing as directed. Secondary Dressing: Drawtex 4x4 in [place] [facility]./7 Days Discharge Instructions: Back prisma with the drawtex. Apply over primary dressing as directed. Com pression Wrap: FourPress (4 layer compression wrap) Other:CHANGE WEEKLY AT WOUND CENTER./7 Days Discharge Instructions: Apply four layer compression as directed. APPLY UNNA BOOT FIRST LAYER TO UPPER PORTION OF LOWER LEG. 1. I am going to continue to use silver collagen backing drawtex still  under 4-layer compression 2. If this does not work consider Iodoflex or possibly [facility]) Signed: 04/26/2020 3:37:20 PM By: Linton Ham MD Entered By: Linton Ham on 04/26/2020 13:25:19 -------------------------------------------------------------------------------- SuperBill Details Patient Name: Date of Service: [name], [name] 04/26/2020 Medical Record Number: [id number] Patient Account Number: [id number] Date of Birth/Sex: Treating RN: 1946/01/09 (74 y.o. [name], Amelia Primary Care Provider: [name] Other Clinician: Referring Provider: Treating Provider/Extender: Drucilla Schmidt Weeks in Treatment: 9 Diagnosis Coding ICD-10 Codes Code Description I89.0 Lymphedema, not elsewhere classified L97.822 Non-pressure chronic ulcer of other part of left lower leg with fat layer exposed L97.812 Non-pressure chronic ulcer of other part of right lower leg with fat layer exposed Facility Procedures CPT4 Code: [id number] Description: [place] - DEB SUBQ TISSUE 20 SQ CM/< ICD-10 Diagnosis Description Y77.412 Non-pressure chronic ulcer of other part of right lower leg with fat layer expose Modifier: d Quantity: 1 CPT4 Code: [id number] ( Description: Facility Use Only) [id number] - [place] LWR LT LEG Modifier: 24 Quantity: 1 Physician Procedures : CPT4 Code Description Modifier [id number] - WC PHYS SUBQ TISS 20 SQ CM ICD-10 Diagnosis Description Q94.765 Non-pressure chronic ulcer of other part of right lower leg with fat layer exposed Quantity: 1 Electronic Signature(s) Signed: 04/26/2020 1:46:24 PM By: Rhae Hammock RN Signed: 04/26/2020 3:37:20 PM By: Linton Ham MD Entered By: Rhae Hammock on 04/26/2020 13:46:24

## 2020-04-26 NOTE — Progress Notes (Signed)
[name], [name] ([id number]) Visit Report for 04/26/2020 Arrival Information Details Patient Name: Date of Service: [name] [name] [name] 04/26/2020 12:45 PM Medical Record Number: [id number] Patient Account Number: [id number] Date of Birth/Sex: Treating RN: 09-12-1945 (74 y.o. [name] [name] Primary Care Chenise Mulvihill: [name] Other Clinician: Referring Keen Ewalt: Treating Chaka Boyson/Extender: Drucilla Schmidt Weeks in Treatment: 9 Visit Information History Since Last Visit Added or deleted any medications: No Patient Arrived: Ambulatory Any new allergies or adverse reactions: No Arrival Time: 12:54 Had a fall or experienced change in No Accompanied By: wife activities of daily living that may affect Transfer Assistance: None risk of falls: Patient Identification Verified: Yes Signs or symptoms of abuse/neglect since last visito No Secondary Verification Process Completed: Yes Hospitalized since last visit: No Patient Requires Transmission-Based Precautions: No Implantable device outside of the clinic excluding No Patient Has Alerts: Yes cellular tissue based products placed in the center Patient Alerts: Patient on Blood Thinner since last visit: on warfarin Has Dressing in Place as Prescribed: Yes Bilateral ABI: [place] Has Compression in Place as Prescribed: Yes Pain Present Now: No Electronic Signature(s) Signed: 04/26/2020 4:05:55 PM By: Levan Hurst RN, [name] Entered By: Levan Hurst on 04/26/2020 12:54:25 -------------------------------------------------------------------------------- Compression Therapy Details Patient Name: Date of Service: [name] [name] [name] 04/26/2020 12:45 PM Medical Record Number: [id number] Patient Account Number: [id number] Date of Birth/Sex: Treating RN: 05/02/1946 (74 y.o. [name] [name], [name] Primary Care Zoe Nordin: [name] Other Clinician: Referring Detrich Rakestraw: Treating Marshay Slates/Extender: Drucilla Schmidt Weeks  in Treatment: 9 Compression Therapy Performed for Wound Assessment: Wound #1 Right,Medial Lower Leg Performed By: Clinician Rhae Hammock, RN Compression Type: Four Layer Post Procedure Diagnosis Same as Pre-procedure Electronic Signature(s) Signed: 04/26/2020 3:27:39 PM By: Rhae Hammock RN Entered By: Rhae Hammock on 04/26/2020 13:17:38 -------------------------------------------------------------------------------- Compression Therapy Details Patient Name: Date of Service: [name] [name] [name] 04/26/2020 12:45 PM Medical Record Number: [id number] Patient Account Number: [id number] Date of Birth/Sex: Treating RN: [birth year] (74 y.o. [name] [name], [name] Primary Care Kiwana Deblasi: [name] Other Clinician: Referring Kehinde Totzke: Treating Masato Pettie/Extender: Drucilla Schmidt Weeks in Treatment: 9 Compression Therapy Performed for Wound Assessment: Wound #2 Left,Lateral Lower Leg Performed By: Clinician Rhae Hammock, RN Compression Type: Four Layer Post Procedure Diagnosis Same as Pre-procedure Electronic Signature(s) Signed: 04/26/2020 3:27:39 PM By: Rhae Hammock RN Entered By: Rhae Hammock on 04/26/2020 13:17:38 -------------------------------------------------------------------------------- Encounter Discharge Information Details Patient Name: Date of Service: [name] [name] [name] 04/26/2020 12:45 PM Medical Record Number: [id number] Patient Account Number: [id number] Date of Birth/Sex: Treating RN: Apr 26, 1946 (74 y.o. [name] [name] Primary Care Cincere Zorn: [name] Other Clinician: Referring Alecxander Mainwaring: Treating Tracina Beaumont/Extender: Drucilla Schmidt Weeks in Treatment: 9 Encounter Discharge Information Items Post Procedure Vitals Discharge Condition: Stable Temperature (F): 98.3 Ambulatory Status: Ambulatory Pulse (bpm): 90 Discharge Destination: Home Respiratory Rate (breaths/min): 18 Transportation: Private  Auto Blood Pressure (mmHg): 177/80 Accompanied By: spouse Schedule Follow-up Appointment: Yes Clinical Summary of Care: Patient Declined Electronic Signature(s) Signed: 04/26/2020 4:07:56 PM By: Baruch Gouty RN, [name] Entered By: Baruch Gouty on 04/26/2020 13:50:56 -------------------------------------------------------------------------------- Lower Extremity Assessment Details Patient Name: Date of Service: [name] [name] [name] 04/26/2020 12:45 PM Medical Record Number: [id number] Patient Account Number: [id number] Date of Birth/Sex: Treating RN: 1945-10-05 (74 y.o. [name] [name] Primary Care Erin Uecker: [name] Other Clinician: Referring Yomar Mejorado: Treating Paraskevi Funez/Extender: Drucilla Schmidt Weeks in Treatment: 9 Edema Assessment Assessed: [name] [name]: No] [name] [name]: No] Edema: [Left: Yes] [Right: Yes] Calf Left: Right: Point  of Measurement: 46 cm From Medial Instep 36.5 cm 43.5 cm Ankle Left: Right: Point of Measurement: 15 cm From Medial Instep 31 cm 34 cm Vascular Assessment Pulses: Dorsalis Pedis Palpable: [Left:Yes] [Right:Yes] Electronic Signature(s) Signed: 04/26/2020 4:05:55 PM By: Levan Hurst RN, [name] Entered By: Levan Hurst on 04/26/2020 13:08:25 -------------------------------------------------------------------------------- Multi Wound Chart Details Patient Name: Date of Service: [name] [name] [name] 04/26/2020 12:45 PM Medical Record Number: [id number] Patient Account Number: [id number] Date of Birth/Sex: Treating RN: [birth year] (74 y.o. [name] [name] Primary Care Kylo Gavin: [name] Other Clinician: Referring Keiji Melland: Treating Eshan Trupiano/Extender: Drucilla Schmidt Weeks in Treatment: 9 Vital Signs Height(in): Pulse(bpm): 62 Weight(lbs): Blood Pressure(mmHg): 177/80 Body Mass Index(BMI): Temperature(F): 98.3 Respiratory Rate(breaths/min): 18 Photos: [1:No Photos Right, Medial Lower Leg] [2:No Photos  Left, Lateral Lower Leg] [N/A:N/A N/A] Wound Location: [1:Blister] [2:Blister] [N/A:N/A] Wounding Event: [1:Lymphedema] [2:Lymphedema] [N/A:N/A] Primary Etiology: [1:Lymphedema, Deep Vein Thrombosis, Lymphedema, Deep Vein Thrombosis, N/A] Comorbid History: [1:Hypertension, Received Radiation 01/15/2020] [2:Hypertension, Received Radiation 01/15/2020] [N/A:N/A] Date Acquired: [1:9] [2:9] [N/A:N/A] Weeks of Treatment: [1:Open] [2:Open] [N/A:N/A] Wound Status: [1:0.7x2x0.9] [2:0.2x0.3x0.8] [N/A:N/A] Measurements L x W x D (cm) [1:1.1] [2:0.047] [N/A:N/A] A (cm) : rea [1:0.99] [2:0.038] [N/A:N/A] Volume (cm) : [1:-40.10%] [2:98.60%] [N/A:N/A] % Reduction in Area: [1:-1153.20%] [2:99.30%] [N/A:N/A] % Reduction in Volume: [1:Full Thickness Without Exposed] [2:Full Thickness Without Exposed] [N/A:N/A] Classification: [1:Support Structures Medium] [2:Support Structures Small] [N/A:N/A] Exudate Amount: [1:Serosanguineous] [2:Serous] [N/A:N/A] Exudate Type: [1:red, brown] [2:amber] [N/A:N/A] Exudate Color: [1:Distinct, outline attached] [2:Flat and Intact] [N/A:N/A] Wound Margin: [1:Medium (34-66%)] [2:Small (1-33%)] [N/A:N/A] Granulation Amount: [1:Red, Pink] [2:Pink, Pale] [N/A:N/A] Granulation Quality: [1:Medium (34-66%)] [2:Large (67-100%)] [N/A:N/A] Necrotic Amount: [1:Fat Layer (Subcutaneous Tissue): Yes Fat Layer (Subcutaneous Tissue): Yes N/A] Exposed Structures: [1:Fascia: No Tendon: No Muscle: No Joint: No Bone: No None] [2:Fascia: No Tendon: No Muscle: No Joint: No Bone: No Medium (34-66%)] [N/A:N/A] Epithelialization: [1:Debridement - Excisional] [2:N/A] [N/A:N/A] Debridement: [1:13:15] [2:N/A] [N/A:N/A] Pre-procedure Verification/Time Out Taken: [1:Lidocaine 4% Topical Solution] [2:N/A] [N/A:N/A] Pain Control: [1:Subcutaneous, Slough] [2:N/A] [N/A:N/A] Tissue Debrided: [1:Skin/Subcutaneous Tissue] [2:N/A] [N/A:N/A] Level: [1:1.4] [2:N/A] [N/A:N/A] Debridement A (sq cm): [1:rea  Curette] [2:N/A] [N/A:N/A] Instrument: [1:Moderate] [2:N/A] [N/A:N/A] Bleeding: [1:Pressure] [2:N/A] [N/A:N/A] Hemostasis A chieved: [1:0] [2:N/A] [N/A:N/A] Procedural Pain: [1:0] [2:N/A] [N/A:N/A] Post Procedural Pain: [1:Procedure was tolerated well] [2:N/A] [N/A:N/A] Debridement Treatment Response: [1:0.7x2x0.9] [2:N/A] [N/A:N/A] Post Debridement Measurements L x W x D (cm) [1:0.99] [2:N/A] [N/A:N/A] Post Debridement Volume: (cm) [1:Compression Therapy] [2:Compression Therapy] [N/A:N/A] Procedures Performed: [1:Debridement] Treatment Notes Electronic Signature(s) Signed: 04/26/2020 3:37:20 PM By: Linton Ham MD Signed: 04/26/2020 4:11:46 PM By: Deon Pilling Entered By: Linton Ham on 04/26/2020 13:20:54 -------------------------------------------------------------------------------- Multi-Disciplinary Care Plan Details Patient Name: Date of Service: [name] [name] [name] 04/26/2020 12:45 PM Medical Record Number: [id number] Patient Account Number: [id number] Date of Birth/Sex: Treating RN: [birth year] (74 y.o. [name] [name], [place] Primary Care Regino Fournet: [name] Other Clinician: Referring Frans Valente: Treating Damonica Chopra/Extender: Drucilla Schmidt Weeks in Treatment: 9 Active Inactive Pain, Acute or Chronic Nursing Diagnoses: Pain, acute or chronic: actual or potential Potential alteration in comfort, pain Goals: Patient will verbalize adequate pain control and receive pain control interventions during procedures as needed Date Initiated: 02/23/2020 Target Resolution Date: 06/14/2020 Goal Status: Active Patient/caregiver will verbalize comfort level met Date Initiated: 02/23/2020 Date Inactivated: 03/22/2020 Target Resolution Date: 03/23/2020 Goal Status: Met Interventions: Provide education on pain management Reposition patient for comfort Notes: Wound/Skin Impairment Nursing Diagnoses: Knowledge deficit related to ulceration/compromised skin  integrity Goals: Ulcer/skin breakdown will heal within 14  weeks Date Initiated: 02/23/2020 Target Resolution Date: 06/01/2020 Goal Status: Active Interventions: Assess patient/caregiver ability to obtain necessary supplies Assess patient/caregiver ability to perform ulcer/skin care regimen upon admission and as needed Provide education on ulcer and skin care Treatment Activities: Skin care regimen initiated : 02/23/2020 Topical wound management initiated : 02/23/2020 Notes: Electronic Signature(s) Signed: 04/26/2020 12:57:25 PM By: Rhae Hammock RN Entered By: Rhae Hammock on 04/26/2020 12:57:23 -------------------------------------------------------------------------------- Pain Assessment Details Patient Name: Date of Service: [name] [name] [name] 04/26/2020 12:45 PM Medical Record Number: [id number] Patient Account Number: [id number] Date of Birth/Sex: Treating RN: 10-05-1945 (74 y.o. [name] [name] Primary Care Latavius Capizzi: [name] Other Clinician: Referring Avrohom Mckelvin: Treating Oksana Deberry/Extender: Drucilla Schmidt Weeks in Treatment: 9 Active Problems Location of Pain Severity and Description of Pain Patient Has Paino No Site Locations Pain Management and Medication Current Pain Management: Electronic Signature(s) Signed: 04/26/2020 4:05:55 PM By: Levan Hurst RN, [name] Entered By: Levan Hurst on 04/26/2020 12:54:50 -------------------------------------------------------------------------------- Patient/Caregiver Education Details Patient Name: Date of Service: [name] [name] [name] 12/9/2021andnbsp12:45 PM Medical Record Number: [id number] Patient Account Number: [id number] Date of Birth/Gender: Treating RN: [birth year] (74 y.o. [name] Primary Care Physician: [name] Other Clinician: Referring Physician: Treating Physician/Extender: Alycia Rossetti in Treatment: 9 Education Assessment Education  Provided To: Patient Education Topics Provided Pain: Methods: Explain/Verbal Responses: State content correctly Wound/Skin Impairment: Methods: Explain/Verbal Responses: State content correctly Electronic Signature(s) Signed: 04/26/2020 3:27:39 PM By: Rhae Hammock RN Entered By: Rhae Hammock on 04/26/2020 12:57:49 -------------------------------------------------------------------------------- Wound Assessment Details Patient Name: Date of Service: [name] [name] [name] 04/26/2020 12:45 PM Medical Record Number: [id number] Patient Account Number: [id number] Date of Birth/Sex: Treating RN: 04-17-1946 (74 y.o. [name] [name] Primary Care Czarina Gingras: [name] Other Clinician: Referring Koralynn Greenspan: Treating Walta [place]/Extender: Drucilla Schmidt Weeks in Treatment: 9 Wound Status Wound Number: 1 Primary Lymphedema Etiology: Wound Location: Right, Medial Lower Leg Wound Status: Open Wounding Event: Blister Comorbid Lymphedema, Deep Vein Thrombosis, Hypertension, Date Acquired: 01/15/2020 History: Received Radiation Weeks Of Treatment: 9 Clustered Wound: No Wound Measurements Length: (cm) 0.7 Width: (cm) 2 Depth: (cm) 0.9 Area: (cm) 1.1 Volume: (cm) 0.99 % Reduction in Area: -40.1% % Reduction in Volume: -1153.2% Epithelialization: None Tunneling: No Undermining: No Wound Description Classification: Full Thickness Without Exposed Support Structures Wound Margin: Distinct, outline attached Exudate Amount: Medium Exudate Type: Serosanguineous Exudate Color: red, brown Foul Odor After Cleansing: No Slough/Fibrino Yes Wound Bed Granulation Amount: Medium (34-66%) Exposed Structure Granulation Quality: Red, Pink Fascia Exposed: No Necrotic Amount: Medium (34-66%) Fat Layer (Subcutaneous Tissue) Exposed: Yes Necrotic Quality: Adherent Slough Tendon Exposed: No Muscle Exposed: No Joint Exposed: No Bone Exposed: No Treatment Notes Wound #1  (Lower Leg) Wound Laterality: Right, Medial Cleanser Peri-Wound Care Triamcinolone 15 (g) Discharge Instruction: Use triamcinolone 15 (g) as directed. MIXED WITH LOTION. Topical Primary Dressing Promogran Prisma Matrix, 4.34 (sq in) (silver collagen) Discharge Instruction: Apply silver collagen to wound bed as instructed. MOISTEN WITH HDYROGEL. Secondary Dressing Woven Gauze Sponge, Non-Sterile 4x4 in Discharge Instruction: Apply over primary dressing as directed. Drawtex 4x4 in Discharge Instruction: Back prisma with the drawtex. Apply over primary dressing as directed. Secured With Compression Wrap FourPress (4 layer compression wrap) Discharge Instruction: Apply four layer compression as directed. APPLY UNNA BOOT FIRST LAYER TO UPPER PORTION OF LOWER LEG. Compression Stockings Add-Ons Electronic Signature(s) Signed: 04/26/2020 4:05:55 PM By: Levan Hurst RN, [name] Entered By: Levan Hurst on 04/26/2020 13:07:17 -------------------------------------------------------------------------------- Wound Assessment Details Patient  Name: Date of Service: [name] [name] [name] 04/26/2020 12:45 PM Medical Record Number: [id number] Patient Account Number: [id number] Date of Birth/Sex: Treating RN: Oct 29, 1945 (74 y.o. [name] [name] Primary Care Matteson Blue: [name] Other Clinician: Referring Estha Few: Treating Rapheal Masso/Extender: Drucilla Schmidt Weeks in Treatment: 9 Wound Status Wound Number: 2 Primary Lymphedema Etiology: Wound Location: Left, Lateral Lower Leg Wound Status: Open Wounding Event: Blister Comorbid Lymphedema, Deep Vein Thrombosis, Hypertension, Date Acquired: 01/15/2020 History: Received Radiation Weeks Of Treatment: 9 Clustered Wound: No Wound Measurements Length: (cm) 0.2 Width: (cm) 0.3 Depth: (cm) 0.8 Area: (cm) 0.047 Volume: (cm) 0.038 % Reduction in Area: 98.6% % Reduction in Volume: 99.3% Epithelialization: Medium  (34-66%) Tunneling: No Undermining: No Wound Description Classification: Full Thickness Without Exposed Support Structures Wound Margin: Flat and Intact Exudate Amount: Small Exudate Type: Serous Exudate Color: amber Foul Odor After Cleansing: No Slough/Fibrino Yes Wound Bed Granulation Amount: Small (1-33%) Exposed Structure Granulation Quality: Pink, Pale Fascia Exposed: No Necrotic Amount: Large (67-100%) Fat Layer (Subcutaneous Tissue) Exposed: Yes Necrotic Quality: Adherent Slough Tendon Exposed: No Muscle Exposed: No Joint Exposed: No Bone Exposed: No Treatment Notes Wound #2 (Lower Leg) Wound Laterality: Left, Lateral Cleanser Peri-Wound Care Triamcinolone 15 (g) Discharge Instruction: Use triamcinolone 15 (g) as directed. MIXED WITH LOTION. Topical Primary Dressing Promogran Prisma Matrix, 4.34 (sq in) (silver collagen) Discharge Instruction: Apply silver collagen to wound bed as instructed. MOISTEN WITH HDYROGEL. Secondary Dressing Woven Gauze Sponge, Non-Sterile 4x4 in Discharge Instruction: Apply over primary dressing as directed. Drawtex 4x4 in Discharge Instruction: Back prisma with the drawtex. Apply over primary dressing as directed. Secured With Compression Wrap FourPress (4 layer compression wrap) Discharge Instruction: Apply four layer compression as directed. APPLY UNNA BOOT FIRST LAYER TO UPPER PORTION OF LOWER LEG. Compression Stockings Add-Ons Electronic Signature(s) Signed: 04/26/2020 4:05:55 PM By: Levan Hurst RN, [name] Entered By: Levan Hurst on 04/26/2020 13:07:48 -------------------------------------------------------------------------------- [place] Details Patient Name: Date of Service: [name] [name] [name] 04/26/2020 12:45 PM Medical Record Number: [id number] Patient Account Number: [id number] Date of Birth/Sex: Treating RN: 1945-06-04 (74 y.o. [name] [name] Primary Care Bellany Elbaum: [name] Other Clinician: Referring  Raychell Holcomb: Treating Leonell Lobdell/Extender: Drucilla Schmidt Weeks in Treatment: 9 Vital Signs Time Taken: 12:54 Temperature (F): 98.3 Pulse (bpm): 90 Respiratory Rate (breaths/min): 18 Blood Pressure (mmHg): 177/80 Reference Range: 80 - 120 mg / dl Electronic Signature(s) Signed: 04/26/2020 4:05:55 PM By: Levan Hurst RN, [name] Entered By: Levan Hurst on 04/26/2020 12:54:45

## 2020-05-03 ENCOUNTER — Other Ambulatory Visit: Payer: Self-pay

## 2020-05-03 ENCOUNTER — Encounter (HOSPITAL_BASED_OUTPATIENT_CLINIC_OR_DEPARTMENT_OTHER): Payer: Medicare Other | Admitting: Internal Medicine

## 2020-05-03 DIAGNOSIS — L97822 Non-pressure chronic ulcer of other part of left lower leg with fat layer exposed: Secondary | ICD-10-CM | POA: Diagnosis not present

## 2020-05-04 NOTE — Progress Notes (Signed)
Matar, BESNIK ([id number]) Visit Report for 05/03/2020 Arrival Information Details Patient Name: Date of Service: Rumsey, [name] 05/03/2020 3:00 PM Medical Record Number: [id number] Patient Account Number: [id number] Date of Birth/Sex: Treating RN: 05/14/1946 (74 y.o. Jerilynn Mages) Carlene Coria Primary Care Provider: [name] Other Clinician: Referring Provider: Treating Provider/Extender: Drucilla Schmidt Weeks in Treatment: 10 Visit Information History Since Last Visit All ordered tests and consults were completed: No Patient Arrived: Ambulatory Added or deleted any medications: No Arrival Time: 15:28 Any new allergies or adverse reactions: No Accompanied By: self Had a fall or experienced change in No Transfer Assistance: None activities of daily living that may affect Patient Identification Verified: Yes risk of falls: Secondary Verification Process Completed: Yes Signs or symptoms of abuse/neglect since last visito No Patient Requires Transmission-Based Precautions: No Hospitalized since last visit: No Patient Has Alerts: Yes Implantable device outside of the clinic excluding No Patient Alerts: Patient on Blood Thinner cellular tissue based products placed in the center on warfarin since last visit: Bilateral ABI: [place] Has Dressing in Place as Prescribed: Yes Has Compression in Place as Prescribed: Yes Pain Present Now: No Electronic Signature(s) Signed: 05/04/2020 5:23:39 PM By: Carlene Coria RN Entered By: Carlene Coria on 05/03/2020 15:28:47 -------------------------------------------------------------------------------- Compression Therapy Details Patient Name: Date of Service: [place], [place] 05/03/2020 3:00 PM Medical Record Number: [id number] Patient Account Number: [id number] Date of Birth/Sex: Treating RN: [birth year] (74 y.o. [name] Primary Care Provider: [name] Other Clinician: Referring Provider: Treating Provider/Extender:  Drucilla Schmidt Weeks in Treatment: 10 Compression Therapy Performed for Wound Assessment: Wound #1 Right,Medial Lower Leg Performed By: Clinician Rhae Hammock, RN Compression Type: Four Layer Post Procedure Diagnosis Same as Pre-procedure Electronic Signature(s) Signed: 05/03/2020 5:23:27 PM By: Deon Pilling Entered By: Deon Pilling on 05/03/2020 15:58:15 -------------------------------------------------------------------------------- Compression Therapy Details Patient Name: Date of Service: Frei, [name] 05/03/2020 3:00 PM Medical Record Number: [id number] Patient Account Number: [id number] Date of Birth/Sex: Treating RN: [birth year] (74 y.o. [name] Primary Care Provider: [name] Other Clinician: Referring Provider: Treating Provider/Extender: Drucilla Schmidt Weeks in Treatment: 10 Compression Therapy Performed for Wound Assessment: Wound #2 Left,Lateral Lower Leg Performed By: Clinician Rhae Hammock, RN Compression Type: Four Layer Post Procedure Diagnosis Same as Pre-procedure Electronic Signature(s) Signed: 05/03/2020 5:23:27 PM By: Deon Pilling Entered By: Deon Pilling on 05/03/2020 15:58:16 -------------------------------------------------------------------------------- Encounter Discharge Information Details Patient Name: Date of Service: [place], [name] 05/03/2020 3:00 PM Medical Record Number: [id number] Patient Account Number: [id number] Date of Birth/Sex: Treating RN: 23-May-1945 (74 y.o. [name], [name] Primary Care Provider: [name] Other Clinician: Referring Provider: Treating Provider/Extender: Drucilla Schmidt Weeks in Treatment: 10 Encounter Discharge Information Items Post Procedure Vitals Discharge Condition: Stable Temperature (F): 97.7 Ambulatory Status: Ambulatory Pulse (bpm): 105 Discharge Destination: Home Respiratory Rate (breaths/min):  18 Transportation: Private Auto Blood Pressure (mmHg): 175/65 Accompanied By: self Schedule Follow-up Appointment: Yes Clinical Summary of Care: Patient Declined Electronic Signature(s) Signed: 05/03/2020 5:06:25 PM By: Rhae Hammock RN Entered By: Rhae Hammock on 05/03/2020 16:30:14 -------------------------------------------------------------------------------- Lower Extremity Assessment Details Patient Name: Date of Service: [place], [name] 05/03/2020 3:00 PM Medical Record Number: [id number] Patient Account Number: [id number] Date of Birth/Sex: Treating RN: [birth year] (74 y.o. Oval Linsey Primary Care Provider: [name] Other Clinician: Referring Provider: Treating Provider/Extender: Salvadore Oxford, [name] Weeks in Treatment: 10 Edema Assessment Assessed: [name]: No] [Right: No] Edema: [Left: Yes] [Right: Yes]  Calf Left: Right: Point of Measurement: 46 cm From Medial Instep 49 cm 58 cm Ankle Left: Right: Point of Measurement: 15 cm From Medial Instep 32 cm 35 cm Electronic Signature(s) Signed: 05/04/2020 5:23:39 PM By: Carlene Coria RN Entered By: Carlene Coria on 05/03/2020 15:33:07 -------------------------------------------------------------------------------- Multi Wound Chart Details Patient Name: Date of Service: [place], [name] 05/03/2020 3:00 PM Medical Record Number: [id number] Patient Account Number: [id number] Date of Birth/Sex: Treating RN: 08-03-1945 (74 y.o. [name] Primary Care Jahmiyah Dullea: [name] Other Clinician: Referring Deen Deguia: Treating Bayne Fosnaugh/Extender: Drucilla Schmidt Weeks in Treatment: 10 Vital Signs Height(in): Pulse(bpm): 105 Weight(lbs): Blood Pressure(mmHg): 175/65 Body Mass Index(BMI): Temperature(F): 97.7 Respiratory Rate(breaths/min): 18 Photos: [1:No Photos Right, Medial Lower Leg] [2:No Photos Left, Lateral Lower Leg] [N/A:N/A N/A] Wound Location: [1:Blister]  [2:Blister] [N/A:N/A] Wounding Event: [1:Lymphedema] [2:Lymphedema] [N/A:N/A] Primary Etiology: [1:Lymphedema, Deep Vein Thrombosis, Lymphedema, Deep Vein Thrombosis, N/A] Comorbid History: [1:Hypertension, Received Radiation 01/15/2020] [2:Hypertension, Received Radiation 01/15/2020] [N/A:N/A] Date Acquired: [1:10] [2:10] [N/A:N/A] Weeks of Treatment: [1:Open] [2:Open] [N/A:N/A] Wound Status: [1:0.4x1.7x0.6] [2:0.3x0.6x0.2] [N/A:N/A] Measurements L x W x D (cm) [1:0.534] [2:0.141] [N/A:N/A] A (cm) : rea [1:0.32] [2:0.028] [N/A:N/A] Volume (cm) : [1:32.00%] [2:95.90%] [N/A:N/A] % Reduction in A [1:rea: -305.10%] [2:99.50%] [N/A:N/A] % Reduction in Volume: [1:Full Thickness Without Exposed] [2:Full Thickness Without Exposed] [N/A:N/A] Classification: [1:Support Structures Medium] [2:Support Structures Small] [N/A:N/A] Exudate A mount: [1:Serosanguineous] [2:Serous] [N/A:N/A] Exudate Type: [1:red, brown] [2:amber] [N/A:N/A] Exudate Color: [1:Distinct, outline attached] [2:Flat and Intact] [N/A:N/A] Wound Margin: [1:Medium (34-66%)] [2:Large (67-100%)] [N/A:N/A] Granulation A mount: [1:Red, Pink] [2:Pink, Pale] [N/A:N/A] Granulation Quality: [1:Medium (34-66%)] [2:Small (1-33%)] [N/A:N/A] Necrotic A mount: [1:Fat Layer (Subcutaneous Tissue): Yes Fat Layer (Subcutaneous Tissue): Yes N/A] Exposed Structures: [1:Fascia: No Tendon: No Muscle: No Joint: No Bone: No None] [2:Fascia: No Tendon: No Muscle: No Joint: No Bone: No Medium (34-66%)] [N/A:N/A] Epithelialization: [1:Debridement - Excisional] [2:Debridement - Excisional] [N/A:N/A] Debridement: Pre-procedure Verification/Time Out 15:55 [2:15:55] [N/A:N/A] Taken: [1:Lidocaine 4% Topical Solution] [2:Lidocaine 4% Topical Solution] [N/A:N/A] Pain Control: [1:Subcutaneous, Slough] [2:Subcutaneous, Slough] [N/A:N/A] Tissue Debrided: [1:Skin/Subcutaneous Tissue] [2:Skin/Subcutaneous Tissue] [N/A:N/A] Level: [1:0.28] [2:0.18]  [N/A:N/A] Debridement A (sq cm): [1:rea Curette] [2:Curette] [N/A:N/A] Instrument: [1:Moderate] [2:Moderate] [N/A:N/A] Bleeding: [1:Pressure] [2:Pressure] [N/A:N/A] Hemostasis Achieved: [1:0] [2:0] [N/A:N/A] Procedural Pain: [1:0] [2:0] [N/A:N/A] Post Procedural Pain: [1:Procedure was tolerated well] [2:Procedure was tolerated well] [N/A:N/A] Debridement Treatment Response: [1:0.4x0.7x0.6] [2:0.3x0.6x0.2] [N/A:N/A] Post Debridement Measurements L x W x D (cm) [1:0.132] [2:0.028] [N/A:N/A] Post Debridement Volume: (cm) [1:Compression Therapy] [2:Compression Therapy] [N/A:N/A] Procedures Performed: [1:Debridement] [2:Debridement] Treatment Notes Wound #1 (Lower Leg) Wound Laterality: Right, Medial Cleanser Peri-Wound Care Triamcinolone 15 (g) Discharge Instruction: Use triamcinolone 15 (g) as directed. MIXED WITH LOTION. Topical Primary Dressing Endoform 2x2 in Discharge Instruction: Moisten with saline Secondary Dressing Woven Gauze Sponge, Non-Sterile 4x4 in Discharge Instruction: Apply over primary dressing as directed. Drawtex 4x4 in Discharge Instruction: Back prisma with the drawtex. Apply over primary dressing as directed. Secured With Compression Wrap FourPress (4 layer compression wrap) Discharge Instruction: Apply four layer compression as directed. APPLY UNNA BOOT FIRST LAYER TO UPPER PORTION OF LOWER LEG. Compression Stockings Add-Ons Wound #2 (Lower Leg) Wound Laterality: Left, Lateral Cleanser Peri-Wound Care Triamcinolone 15 (g) Discharge Instruction: Use triamcinolone 15 (g) as directed. MIXED WITH LOTION. Topical Primary Dressing Endoform 2x2 in Discharge Instruction: Moisten with saline Secondary Dressing Woven Gauze Sponge, Non-Sterile 4x4 in Discharge Instruction: Apply over primary dressing as directed. Drawtex 4x4 in Discharge Instruction: Back prisma with the drawtex. Apply over primary dressing as  directed. Secured With Compression  Wrap FourPress (4 layer compression wrap) Discharge Instruction: Apply four layer compression as directed. APPLY UNNA BOOT FIRST LAYER TO UPPER PORTION OF LOWER LEG. Compression Stockings Add-Ons Electronic Signature(s) Signed: 05/04/2020 8:01:39 AM By: Linton Ham MD Signed: 05/04/2020 5:32:58 PM By: Deon Pilling Entered By: Linton Ham on 05/04/2020 07:54:02 -------------------------------------------------------------------------------- Multi-Disciplinary Care Plan Details Patient Name: Date of Service: [place], [name] 05/03/2020 3:00 PM Medical Record Number: [id number] Patient Account Number: [id number] Date of Birth/Sex: Treating RN: 08-23-1945 (74 y.o. [name] Primary Care Provider: [name] Other Clinician: Referring Provider: Treating Provider/Extender: Drucilla Schmidt Weeks in Treatment: 10 Active Inactive Pain, Acute or Chronic Nursing Diagnoses: Pain, acute or chronic: actual or potential Potential alteration in comfort, pain Goals: Patient will verbalize adequate pain control and receive pain control interventions during procedures as needed Date Initiated: 02/23/2020 Target Resolution Date: 06/14/2020 Goal Status: Active Patient/caregiver will verbalize comfort level met Date Initiated: 02/23/2020 Date Inactivated: 03/22/2020 Target Resolution Date: 03/23/2020 Goal Status: Met Interventions: Provide education on pain management Reposition patient for comfort Notes: Wound/Skin Impairment Nursing Diagnoses: Knowledge deficit related to ulceration/compromised skin integrity Goals: Ulcer/skin breakdown will heal within 14 weeks Date Initiated: 02/23/2020 Target Resolution Date: 06/15/2020 Goal Status: Active Interventions: Assess patient/caregiver ability to obtain necessary supplies Assess patient/caregiver ability to perform ulcer/skin care regimen upon admission and as needed Provide education on ulcer and skin  care Treatment Activities: Skin care regimen initiated : 02/23/2020 Topical wound management initiated : 02/23/2020 Notes: Electronic Signature(s) Signed: 05/03/2020 5:23:27 PM By: Deon Pilling Entered By: Deon Pilling on 05/03/2020 17:00:37 -------------------------------------------------------------------------------- Pain Assessment Details Patient Name: Date of Service: [place]asci, [place] 05/03/2020 3:00 PM Medical Record Number: [id number] Patient Account Number: [id number] Date of Birth/Sex: Treating RN: 22-Nov-1945 (74 y.o. Oval Linsey Primary Care Provider: [name] Other Clinician: Referring Provider: Treating Provider/Extender: Drucilla Schmidt Weeks in Treatment: 10 Active Problems Location of Pain Severity and Description of Pain Patient Has Paino No Site Locations Pain Management and Medication Current Pain Management: Electronic Signature(s) Signed: 05/04/2020 5:23:39 PM By: Carlene Coria RN Entered By: Carlene Coria on 05/03/2020 15:29:57 -------------------------------------------------------------------------------- Patient/Caregiver Education Details Patient Name: Date of Service: [place], [name] 12/16/2021andnbsp3:00 PM Medical Record Number: [id number] Patient Account Number: [id number] Date of Birth/Gender: Treating RN: 04/13/1946 (74 y.o. [name] Primary Care Physician: [name] Other Clinician: Referring Physician: Treating Physician/Extender: Alycia Rossetti in Treatment: 10 Education Assessment Education Provided To: Patient Education Topics Provided Pain: Handouts: A Guide to Pain Control Methods: Explain/Verbal Responses: Reinforcements needed Electronic Signature(s) Signed: 05/03/2020 5:23:27 PM By: Deon Pilling Entered By: Deon Pilling on 05/03/2020 17:00:47 -------------------------------------------------------------------------------- Wound Assessment  Details Patient Name: Date of Service: [place], [name] 05/03/2020 3:00 PM Medical Record Number: [id number] Patient Account Number: [id number] Date of Birth/Sex: Treating RN: 1945/10/03 (74 y.o. Oval Linsey Primary Care Provider: [name] Other Clinician: Referring Provider: Treating Provider/Extender: Drucilla Schmidt Weeks in Treatment: 10 Wound Status Wound Number: 1 Primary Lymphedema Etiology: Wound Location: Right, Medial Lower Leg Wound Status: Open Wounding Event: Blister Comorbid Lymphedema, Deep Vein Thrombosis, Hypertension, Date Acquired: 01/15/2020 History: Received Radiation Weeks Of Treatment: 10 Clustered Wound: No Photos Photo Uploaded By: Mikeal Hawthorne on 05/04/2020 14:24:00 Wound Measurements Length: (cm) 0.4 Width: (cm) 1.7 Depth: (cm) 0.6 Area: (cm) 0.534 Volume: (cm) 0.32 % Reduction in Area: 32% % Reduction in Volume: -305.1% Epithelialization: None Tunneling: No Undermining:  No Wound Description Classification: Full Thickness Without Exposed Support Structures Wound Margin: Distinct, outline attached Exudate Amount: Medium Exudate Type: Serosanguineous Exudate Color: red, brown Foul Odor After Cleansing: No Slough/Fibrino Yes Wound Bed Granulation Amount: Medium (34-66%) Exposed Structure Granulation Quality: Red, Pink Fascia Exposed: No Necrotic Amount: Medium (34-66%) Fat Layer (Subcutaneous Tissue) Exposed: Yes Necrotic Quality: Adherent Slough Tendon Exposed: No Muscle Exposed: No Joint Exposed: No Bone Exposed: No Treatment Notes Wound #1 (Lower Leg) Wound Laterality: Right, Medial Cleanser Peri-Wound Care Triamcinolone 15 (g) Discharge Instruction: Use triamcinolone 15 (g) as directed. MIXED WITH LOTION. Topical Primary Dressing Endoform 2x2 in Discharge Instruction: Moisten with saline Secondary Dressing Woven Gauze Sponge, Non-Sterile 4x4 in Discharge Instruction: Apply over primary  dressing as directed. Drawtex 4x4 in Discharge Instruction: Back prisma with the drawtex. Apply over primary dressing as directed. Secured With Compression Wrap FourPress (4 layer compression wrap) Discharge Instruction: Apply four layer compression as directed. APPLY UNNA BOOT FIRST LAYER TO UPPER PORTION OF LOWER LEG. Compression Stockings Add-Ons Electronic Signature(s) Signed: 05/04/2020 5:23:39 PM By: Carlene Coria RN Entered By: Carlene Coria on 05/03/2020 15:36:17 -------------------------------------------------------------------------------- Wound Assessment Details Patient Name: Date of Service: [place], [place] 05/03/2020 3:00 PM Medical Record Number: [id number] Patient Account Number: [id number] Date of Birth/Sex: Treating RN: [birth year] (74 y.o. Jerilynn Mages) Carlene Coria Primary Care Provider: [name] Other Clinician: Referring Provider: Treating Provider/Extender: Drucilla Schmidt Weeks in Treatment: 10 Wound Status Wound Number: 2 Primary Lymphedema Etiology: Wound Location: Left, Lateral Lower Leg Wound Status: Open Wounding Event: Blister Comorbid Lymphedema, Deep Vein Thrombosis, Hypertension, Date Acquired: 01/15/2020 History: Received Radiation Weeks Of Treatment: 10 Clustered Wound: No Photos Photo Uploaded By: Mikeal Hawthorne on 05/04/2020 14:23:46 Wound Measurements Length: (cm) 0.3 Width: (cm) 0.6 Depth: (cm) 0.2 Area: (cm) 0.141 Volume: (cm) 0.028 % Reduction in Area: 95.9% % Reduction in Volume: 99.5% Epithelialization: Medium (34-66%) Tunneling: No Undermining: No Wound Description Classification: Full Thickness Without Exposed Support Structures Wound Margin: Flat and Intact Exudate Amount: Small Exudate Type: Serous Exudate Color: amber Foul Odor After Cleansing: No Slough/Fibrino Yes Wound Bed Granulation Amount: Large (67-100%) Exposed Structure Granulation Quality: Pink, Pale Fascia Exposed: No Necrotic Amount:  Small (1-33%) Fat Layer (Subcutaneous Tissue) Exposed: Yes Necrotic Quality: Adherent Slough Tendon Exposed: No Muscle Exposed: No Joint Exposed: No Bone Exposed: No Treatment Notes Wound #2 (Lower Leg) Wound Laterality: Left, Lateral Cleanser Peri-Wound Care Triamcinolone 15 (g) Discharge Instruction: Use triamcinolone 15 (g) as directed. MIXED WITH LOTION. Topical Primary Dressing Endoform 2x2 in Discharge Instruction: Moisten with saline Secondary Dressing Woven Gauze Sponge, Non-Sterile 4x4 in Discharge Instruction: Apply over primary dressing as directed. Drawtex 4x4 in Discharge Instruction: Back prisma with the drawtex. Apply over primary dressing as directed. Secured With Compression Wrap FourPress (4 layer compression wrap) Discharge Instruction: Apply four layer compression as directed. APPLY UNNA BOOT FIRST LAYER TO UPPER PORTION OF LOWER LEG. Compression Stockings Add-Ons Electronic Signature(s) Signed: 05/04/2020 5:23:39 PM By: Carlene Coria RN Entered By: Carlene Coria on 05/03/2020 15:36:34 -------------------------------------------------------------------------------- Vitals Details Patient Name: Date of Service: [place], [name] 05/03/2020 3:00 PM Medical Record Number: [id number] Patient Account Number: [id number] Date of Birth/Sex: Treating RN: 18-Dec-1945 (74 y.o. Oval Linsey Primary Care Provider: [name] Other Clinician: Referring Provider: Treating Provider/Extender: Drucilla Schmidt Weeks in Treatment: 10 Vital Signs Time Taken: 15:28 Temperature (F): 97.7 Pulse (bpm): 105 Respiratory Rate (breaths/min): 18 Blood Pressure (mmHg): 175/65 Reference Range: 80 - 120 mg / dl  Electronic Signature(s) Signed: 05/04/2020 5:23:39 PM By: Carlene Coria RN Entered By: Carlene Coria on 05/03/2020 15:29:17

## 2020-05-04 NOTE — Progress Notes (Signed)
Haglund, AGAM ([id number]) Visit Report for 05/03/2020 Debridement Details Patient Name: Date of Service: [name], [name] 05/03/2020 3:00 PM Medical Record Number: [id number] Patient Account Number: [id number] Date of Birth/Sex: Treating RN: 1945-10-25 (74 y.o. [name] Primary Care Provider: [name] Other Clinician: Referring Provider: Treating Provider/Extender: Drucilla Schmidt Weeks in Treatment: 10 Debridement Performed for Assessment: Wound #1 Right,Medial Lower Leg Performed By: Physician Ricard Dillon., MD Debridement Type: Debridement Level of Consciousness (Pre-procedure): Awake and Alert Pre-procedure Verification/Time Out Yes - 15:55 Taken: Start Time: 15:56 Pain Control: Lidocaine 4% T opical Solution T Area Debrided (L x W): otal 0.4 (cm) x 0.7 (cm) = 0.28 (cm) Tissue and other material debrided: Viable, Non-Viable, Slough, Subcutaneous, Skin: Dermis , Skin: Epidermis, Fibrin/Exudate, Slough Level: Skin/Subcutaneous Tissue Debridement Description: Excisional Instrument: Curette Bleeding: Moderate Hemostasis Achieved: Pressure End Time: 15:58 Procedural Pain: 0 Post Procedural Pain: 0 Response to Treatment: Procedure was tolerated well Level of Consciousness (Post- Awake and Alert procedure): Post Debridement Measurements of Total Wound Length: (cm) 0.4 Width: (cm) 0.7 Depth: (cm) 0.6 Volume: (cm) 0.132 Character of Wound/Ulcer Post Debridement: Improved Post Procedure Diagnosis Same as Pre-procedure Electronic Signature(s) Signed: 05/04/2020 8:01:39 AM By: Linton Ham MD Signed: 05/04/2020 5:32:58 PM By: Deon Pilling Previous Signature: 05/03/2020 5:23:27 PM Version By: Deon Pilling Entered By: Linton Ham on 05/04/2020 07:54:11 -------------------------------------------------------------------------------- Debridement Details Patient Name: Date of Service: [name], [name] 05/03/2020 3:00 PM Medical  Record Number: [id number] Patient Account Number: [id number] Date of Birth/Sex: Treating RN: June 04, 1945 (74 y.o. [name] Primary Care Provider: [name] Other Clinician: Referring Provider: Treating Provider/Extender: Drucilla Schmidt Weeks in Treatment: 10 Debridement Performed for Assessment: Wound #2 Left,Lateral Lower Leg Performed By: Physician Ricard Dillon., MD Debridement Type: Debridement Level of Consciousness (Pre-procedure): Awake and Alert Pre-procedure Verification/Time Out Yes - 15:55 Taken: Start Time: 15:56 Pain Control: Lidocaine 4% T opical Solution T Area Debrided (L x W): otal 0.3 (cm) x 0.6 (cm) = 0.18 (cm) Tissue and other material debrided: Viable, Non-Viable, Slough, Subcutaneous, Skin: Dermis , Skin: Epidermis, Fibrin/Exudate, Slough Level: Skin/Subcutaneous Tissue Debridement Description: Excisional Instrument: Curette Bleeding: Moderate Hemostasis Achieved: Pressure End Time: 15:58 Procedural Pain: 0 Post Procedural Pain: 0 Response to Treatment: Procedure was tolerated well Level of Consciousness (Post- Awake and Alert procedure): Post Debridement Measurements of Total Wound Length: (cm) 0.3 Width: (cm) 0.6 Depth: (cm) 0.2 Volume: (cm) 0.028 Character of Wound/Ulcer Post Debridement: Improved Post Procedure Diagnosis Same as Pre-procedure Electronic Signature(s) Signed: 05/04/2020 8:01:39 AM By: Linton Ham MD Signed: 05/04/2020 5:32:58 PM By: Deon Pilling Previous Signature: 05/03/2020 5:23:27 PM Version By: Deon Pilling Entered By: Linton Ham on 05/04/2020 07:54:20 -------------------------------------------------------------------------------- HPI Details Patient Name: Date of Service: [name], [name] 05/03/2020 3:00 PM Medical Record Number: [id number] Patient Account Number: [id number] Date of Birth/Sex: Treating RN: 09-10-1945 (74 y.o. [name] Primary Care Provider: [name] Other Clinician: Referring Provider: Treating Provider/Extender: Drucilla Schmidt Weeks in Treatment: 10 History of Present Illness HPI Description: ADMISSION 02/23/2020 Patient is a 74 year old man who lives in [place] who arrives accompanied by his wife. He has a history of chronic lymphedema and venous insufficiency in his bilateral lower legs which may have something to do that with having a history of DVT as well as being treated for prostate cancer. In any case he recently got compression pumps at home but compliance has been an issue here. He has compression stockings  however they are probably not sufficient enough to control swelling. They tell us that things deteriorated for him in late August he was admitted to [facility] for 7 days. This was with cellulitis I think of his bilateral lower legs. Discharge he was noted to have wounds on his bilateral lower legs. He was discharged on Bactrim. They tried to get him home health through [facility] of course they declined him. His wife is been wrapping these applying some form of silver foam dressing. He has a history of wounds before although nothing that would not heal with basic home topical dressings. He has 2 areas on the left medial, left anterior and left lateral and a smaller area on the right medial. All of these have considerable depth. Past medical history includes iron deficiency anemia, lymphedema followed by the rehab center at [facility] with lymphedema wraps I believe, DVT on chronic anticoagulation, prostate cancer, chronic venous insufficiency, hypertension. As mentioned he has compression pumps but does not use them. ABIs in our clinic were noncompressible bilaterally 10/14; patient with severe bilateral lymphedema right greater than left. He came in with bilateral lower extremity wounds left greater than right. Even though the right side has more of the edema most of the wounds  here almost closed on the right medial. He has 3 remaining wounds on the left We have been using silver alginate under 4-layer compression I have been trying to get him to be compliant with his external compression pumps 10/21; patient with 3 small wounds on the left leg and 1 on the right medial in the setting of severe lymphedema and chronic venous insufficiency. We have been using silver alginate under 4-layer compression he is using his external compression pumps twice a day 11/4; ARTERIAL STUDIES on the right show an ABI of 1.02 TBI of 0.858 with biphasic waveforms on the left 0.98 with a TBI of 0.55 and biphasic waveforms. Does not look like he has significant arterial disease. We are treating him for lymphedema he has compression pumps. He has punched-out areas on the left anterior left lateral and right medial lower extremities 11/11; after we obtained his arterial studies I put him in 4 layer compression. He is using his compression pumps probably once a day although I have asked him to do twice. Primary dressing to the wound is silver collagen he has severe lymphedema likely secondary to chronic venous insufficiency. Wounds on the left lateral, left medial and left anterior and a small area on the right medial 12/2; the area on the right anterior lower leg has healed. We initially thought that the area medially had healed as well however when her discharge nurse came in she detected fluid in the wound simply opened up. This is actually worse than I remember this pain. The area on the left lateral potentially slightly smaller He is also complaining about pain in his left hand he says that this is actually been getting some better he has been using topical creams on this. She asked that I look at this 12/9 after last weeks issues we have 2 wounds one on the right medial lower leg and 1 on the left lateral. Both of these are in the same condition. I think because of thickened skin secondary to  chronic lymphedema these wounds actually have depth of almost 0.8 cm. 12/16; the patient has 2 small but deep wounds one on the right medial and one on the left lateral. The right medial  is actually the worst of these. He arrives in clinic today with absolutely terrible edema in the right leg apparently his 4-layer wrap fell down to just above his ankle he did not think about this he is apparently been continuing to use his compression pump twice a day. The left leg looks a lot better. Electronic Signature(s) Signed: 05/04/2020 8:01:39 AM By: Linton Ham MD Entered By: Linton Ham on 05/04/2020 07:55:16 -------------------------------------------------------------------------------- Physical Exam Details Patient Name: Date of Service: [name], [name] 05/03/2020 3:00 PM Medical Record Number: [id number] Patient Account Number: [id number] Date of Birth/Sex: Treating RN: [birth year] (74 y.o. [name] Primary Care Provider: [name] Other Clinician: Referring Provider: Treating Provider/Extender: Drucilla Schmidt Weeks in Treatment: 10 Constitutional Patient is hypertensive.. Pulse regular and within target range for patient.Marland Kitchen Respirations regular, non-labored and within target range.. Temperature is normal and within the target range for the patient.Marland Kitchen Appears in no distress. Notes Wound exam; both wounds have necrotic debris on the bottom although they are small. I used a #3 curette to remove necrotic subcutaneous debris. The area on the right has the same depth as last week. I see no improvement in this but fortunately no undermining and no obvious infection On the left the wound is much more shallow and appears to be making progress. His edema on the right leg is absolutely terrible nonpitting but no evidence of DVT acutely or cellulitis. The patient is not in any pain. Electronic Signature(s) Signed: 05/04/2020 8:01:39 AM By: Linton Ham  MD Entered By: Linton Ham on 05/04/2020 07:56:31 -------------------------------------------------------------------------------- Physician Orders Details Patient Name: Date of Service: [name], [name] 05/03/2020 3:00 PM Medical Record Number: [id number] Patient Account Number: [id number] Date of Birth/Sex: Treating RN: 12/24/1945 (74 y.o. [name], [name] Primary Care Provider: [name] Other Clinician: Referring Provider: Treating Provider/Extender: Drucilla Schmidt Weeks in Treatment: 10 Verbal / Phone Orders: No Diagnosis Coding ICD-10 Coding Code Description I89.0 Lymphedema, not elsewhere classified L97.822 Non-pressure chronic ulcer of other part of left lower leg with fat layer exposed L97.812 Non-pressure chronic ulcer of other part of right lower leg with fat layer exposed Follow-up Appointments ppointment in 1 week. - Wednesday Return A Bathing/ Shower/ Hygiene May shower with protection but do not get wound dressing(s) wet. - use a cast protector. Edema Control - Lymphedema / SCD / Other Bilateral Lower Extremities Lymphedema Pumps. Use Lymphedema pumps on leg(s) 2-3 times a day for 45-60 minutes. If wearing any wraps or hose, do not remove them. Continue exercising as instructed. - 3 times a day. Elevate legs to the level of the heart or above for 30 minutes daily and/or when sitting, a frequency of: - 3-4 times Avoid standing for long periods of time. Exercise regularly Wound Treatment Wound #1 - Lower Leg Wound Laterality: Right, Medial Peri-Wound Care: Triamcinolone 15 (g) Other:CHANGE WEEKLY AT WOUND CENTER./7 Days Discharge Instructions: Use triamcinolone 15 (g) as directed. MIXED WITH LOTION. Prim Dressing: Endoform 2x2 in Other:CHANGE WEEKLY AT WOUND CENTER./7 Days ary Discharge Instructions: Moisten with saline Secondary Dressing: Woven Gauze Sponge, Non-Sterile 4x4 in Other:CHANGE WEEKLY AT WOUND CENTER./7 Days Discharge  Instructions: Apply over primary dressing as directed. Secondary Dressing: Drawtex 4x4 in [place] [facility]./7 Days Discharge Instructions: Back prisma with the drawtex. Apply over primary dressing as directed. Compression Wrap: FourPress (4 layer compression wrap) Other:CHANGE WEEKLY AT WOUND CENTER./7 Days Discharge Instructions: Apply four layer compression as directed. APPLY UNNA BOOT FIRST LAYER  TO UPPER PORTION OF LOWER LEG. Wound #2 - Lower Leg Wound Laterality: Left, Lateral Peri-Wound Care: Triamcinolone 15 (g) Other:CHANGE WEEKLY AT WOUND CENTER./7 Days Discharge Instructions: Use triamcinolone 15 (g) as directed. MIXED WITH LOTION. Prim Dressing: Endoform 2x2 in Other:CHANGE WEEKLY AT WOUND CENTER./7 Days ary Discharge Instructions: Moisten with saline Secondary Dressing: Woven Gauze Sponge, Non-Sterile 4x4 in Other:CHANGE WEEKLY AT WOUND CENTER./7 Days Discharge Instructions: Apply over primary dressing as directed. Secondary Dressing: Drawtex 4x4 in [place] [facility]./7 Days Discharge Instructions: Back prisma with the drawtex. Apply over primary dressing as directed. Compression Wrap: FourPress (4 layer compression wrap) Other:CHANGE WEEKLY AT WOUND CENTER./7 Days Discharge Instructions: Apply four layer compression as directed. APPLY UNNA BOOT FIRST LAYER TO UPPER PORTION OF LOWER LEG. Electronic Signature(s) Signed: 05/03/2020 5:23:27 PM By: Deon Pilling Signed: 05/04/2020 8:01:39 AM By: Linton Ham MD Entered By: Deon Pilling on 05/03/2020 16:01:25 -------------------------------------------------------------------------------- Problem List Details Patient Name: Date of Service: [name], [name] 05/03/2020 3:00 PM Medical Record Number: [id number] Patient Account Number: [id number] Date of Birth/Sex: Treating RN: [birth year] (74 y.o. [name], [name] Primary Care Provider: [name] Other Clinician: Referring  Provider: Treating Provider/Extender: Drucilla Schmidt Weeks in Treatment: 10 Active Problems ICD-10 Encounter Code Description Active Date MDM Diagnosis I89.0 Lymphedema, not elsewhere classified 02/23/2020 No Yes L97.822 Non-pressure chronic ulcer of other part of left lower leg with fat layer exposed10/11/2019 No Yes L97.812 Non-pressure chronic ulcer of other part of right lower leg with fat layer 02/23/2020 No Yes exposed Inactive Problems Resolved Problems Electronic Signature(s) Signed: 05/04/2020 8:01:39 AM By: Linton Ham MD Previous Signature: 05/03/2020 5:23:27 PM Version By: Deon Pilling Entered By: Linton Ham on 05/04/2020 07:53:53 -------------------------------------------------------------------------------- Progress Note Details Patient Name: Date of Service: [name], [name] 05/03/2020 3:00 PM Medical Record Number: [id number] Patient Account Number: [id number] Date of Birth/Sex: Treating RN: 07/25/1945 (74 y.o. [name] Primary Care Provider: [name] Other Clinician: Referring Provider: Treating Provider/Extender: Drucilla Schmidt Weeks in Treatment: 10 Subjective History of Present Illness (HPI) ADMISSION 02/23/2020 Patient is a 74 year old man who lives in [place] who arrives accompanied by his wife. He has a history of chronic lymphedema and venous insufficiency in his bilateral lower legs which may have something to do that with having a history of DVT as well as being treated for prostate cancer. In any case he recently got compression pumps at home but compliance has been an issue here. He has compression stockings however they are probably not sufficient enough to control swelling. They tell us that things deteriorated for him in late August he was admitted to [facility] for 7 days. This was with cellulitis I think of his bilateral lower legs. Discharge he was noted to have wounds on his bilateral lower  legs. He was discharged on Bactrim. They tried to get him home health through [facility] part [facility] of course they declined him. His wife is been wrapping these applying some form of silver foam dressing. He has a history of wounds before although nothing that would not heal with basic home topical dressings. He has 2 areas on the left medial, left anterior and left lateral and a smaller area on the right medial. All of these have considerable depth. Past medical history includes iron deficiency anemia, lymphedema followed by the rehab center at [facility] with lymphedema wraps I believe, DVT on chronic anticoagulation, prostate cancer, chronic venous insufficiency, hypertension. As mentioned he  has compression pumps but does not use them. ABIs in our clinic were noncompressible bilaterally 10/14; patient with severe bilateral lymphedema right greater than left. He came in with bilateral lower extremity wounds left greater than right. Even though the right side has more of the edema most of the wounds here almost closed on the right medial. He has 3 remaining wounds on the left We have been using silver alginate under 4-layer compression I have been trying to get him to be compliant with his external compression pumps 10/21; patient with 3 small wounds on the left leg and 1 on the right medial in the setting of severe lymphedema and chronic venous insufficiency. We have been using silver alginate under 4-layer compression he is using his external compression pumps twice a day 11/4; ARTERIAL STUDIES on the right show an ABI of 1.02 TBI of 0.858 with biphasic waveforms on the left 0.98 with a TBI of 0.55 and biphasic waveforms. Does not look like he has significant arterial disease. We are treating him for lymphedema he has compression pumps. He has punched-out areas on the left anterior left lateral and right medial lower extremities 11/11; after we obtained his arterial studies I put  him in 4 layer compression. He is using his compression pumps probably once a day although I have asked him to do twice. Primary dressing to the wound is silver collagen he has severe lymphedema likely secondary to chronic venous insufficiency. Wounds on the left lateral, left medial and left anterior and a small area on the right medial 12/2; the area on the right anterior lower leg has healed. We initially thought that the area medially had healed as well however when her discharge nurse came in she detected fluid in the wound simply opened up. This is actually worse than I remember this pain. The area on the left lateral potentially slightly smaller He is also complaining about pain in his left hand he says that this is actually been getting some better he has been using topical creams on this. She asked that I look at this 12/9 after last weeks issues we have 2 wounds one on the right medial lower leg and 1 on the left lateral. Both of these are in the same condition. I think because of thickened skin secondary to chronic lymphedema these wounds actually have depth of almost 0.8 cm. 12/16; the patient has 2 small but deep wounds one on the right medial and one on the left lateral. The right medial is actually the worst of these. He arrives in clinic today with absolutely terrible edema in the right leg apparently his 4-layer wrap fell down to just above his ankle he did not think about this he is apparently been continuing to use his compression pump twice a day. The left leg looks a lot better. Objective Constitutional Patient is hypertensive.. Pulse regular and within target range for patient.Marland Kitchen Respirations regular, non-labored and within target range.. Temperature is normal and within the target range for the patient.Marland Kitchen Appears in no distress. Vitals Time Taken: 3:28 PM, Temperature: 97.7 F, Pulse: 105 bpm, Respiratory Rate: 18 breaths/min, Blood Pressure: 175/65 mmHg. General Notes: Wound  exam; both wounds have necrotic debris on the bottom although they are small. I used a #3 curette to remove necrotic subcutaneous debris. The area on the right has the same depth as last week. I see no improvement in this but fortunately no undermining and no obvious infection ooOn the left the wound is much  more shallow and appears to be making progress. ooHis edema on the right leg is absolutely terrible nonpitting but no evidence of DVT acutely or cellulitis. The patient is not in any pain. Integumentary (Hair, Skin) Wound #1 status is Open. Original cause of wound was Blister. The wound is located on the Right,Medial Lower Leg. The wound measures 0.4cm length x 1.7cm width x 0.6cm depth; 0.534cm^2 area and 0.32cm^3 volume. There is Fat Layer (Subcutaneous Tissue) exposed. There is no tunneling or undermining noted. There is a medium amount of serosanguineous drainage noted. The wound margin is distinct with the outline attached to the wound base. There is medium (34-66%) red, pink granulation within the wound bed. There is a medium (34-66%) amount of necrotic tissue within the wound bed including Adherent Slough. Wound #2 status is Open. Original cause of wound was Blister. The wound is located on the Left,Lateral Lower Leg. The wound measures 0.3cm length x 0.6cm width x 0.2cm depth; 0.141cm^2 area and 0.028cm^3 volume. There is Fat Layer (Subcutaneous Tissue) exposed. There is no tunneling or undermining noted. There is a small amount of serous drainage noted. The wound margin is flat and intact. There is large (67-100%) pink, pale granulation within the wound bed. There is a small (1-33%) amount of necrotic tissue within the wound bed including Adherent Slough. Assessment Active Problems ICD-10 Lymphedema, not elsewhere classified Non-pressure chronic ulcer of other part of left lower leg with fat layer exposed Non-pressure chronic ulcer of other part of right lower leg with fat layer  exposed Procedures Wound #1 Pre-procedure diagnosis of Wound #1 is a Lymphedema located on the Right,Medial Lower Leg . There was a Excisional Skin/Subcutaneous Tissue Debridement with a total area of 0.28 sq cm performed by Ricard Dillon., MD. With the following instrument(s): Curette to remove Viable and Non-Viable tissue/material. Material removed includes Subcutaneous Tissue, Slough, Skin: Dermis, Skin: Epidermis, and Fibrin/Exudate after achieving pain control using Lidocaine 4% T opical Solution. A time out was conducted at 15:55, prior to the start of the procedure. A Moderate amount of bleeding was controlled with Pressure. The procedure was tolerated well with a pain level of 0 throughout and a pain level of 0 following the procedure. Post Debridement Measurements: 0.4cm length x 0.7cm width x 0.6cm depth; 0.132cm^3 volume. Character of Wound/Ulcer Post Debridement is improved. Post procedure Diagnosis Wound #1: Same as Pre-Procedure Pre-procedure diagnosis of Wound #1 is a Lymphedema located on the Right,Medial Lower Leg . There was a Four Layer Compression Therapy Procedure by Rhae Hammock, RN. Post procedure Diagnosis Wound #1: Same as Pre-Procedure Wound #2 Pre-procedure diagnosis of Wound #2 is a Lymphedema located on the Left,Lateral Lower Leg . There was a Excisional Skin/Subcutaneous Tissue Debridement with a total area of 0.18 sq cm performed by Ricard Dillon., MD. With the following instrument(s): Curette to remove Viable and Non-Viable tissue/material. Material removed includes Subcutaneous Tissue, Slough, Skin: Dermis, Skin: Epidermis, and Fibrin/Exudate after achieving pain control using Lidocaine 4% T opical Solution. A time out was conducted at 15:55, prior to the start of the procedure. A Moderate amount of bleeding was controlled with Pressure. The procedure was tolerated well with a pain level of 0 throughout and a pain level of 0 following the procedure.  Post Debridement Measurements: 0.3cm length x 0.6cm width x 0.2cm depth; 0.028cm^3 volume. Character of Wound/Ulcer Post Debridement is improved. Post procedure Diagnosis Wound #2: Same as Pre-Procedure Pre-procedure diagnosis of Wound #2 is a Lymphedema located on the Left,Lateral  Lower Leg . There was a Four Layer Compression Therapy Procedure by Rhae Hammock, RN. Post procedure Diagnosis Wound #2: Same as Pre-Procedure Plan Follow-up Appointments: Return Appointment in 1 week. - Wednesday Bathing/ Shower/ Hygiene: May shower with protection but do not get wound dressing(s) wet. - use a cast protector. Edema Control - Lymphedema / SCD / Other: Lymphedema Pumps. Use Lymphedema pumps on leg(s) 2-3 times a day for 45-60 minutes. If wearing any wraps or hose, do not remove them. Continue exercising as instructed. - 3 times a day. Elevate legs to the level of the heart or above for 30 minutes daily and/or when sitting, a frequency of: - 3-4 times Avoid standing for long periods of time. Exercise regularly WOUND #1: - Lower Leg Wound Laterality: Right, Medial Peri-Wound Care: Triamcinolone 15 (g) Other:CHANGE WEEKLY AT WOUND CENTER./7 Days Discharge Instructions: Use triamcinolone 15 (g) as directed. MIXED WITH LOTION. Prim Dressing: Endoform 2x2 in Other:CHANGE WEEKLY AT WOUND CENTER./7 Days ary Discharge Instructions: Moisten with saline Secondary Dressing: Woven Gauze Sponge, Non-Sterile 4x4 in Other:CHANGE WEEKLY AT WOUND CENTER./7 Days Discharge Instructions: Apply over primary dressing as directed. Secondary Dressing: Drawtex 4x4 in [place] [facility]ENTER./7 Days Discharge Instructions: Back prisma with the drawtex. Apply over primary dressing as directed. Com pression Wrap: FourPress (4 layer compression wrap) Other:CHANGE WEEKLY AT WOUND CENTER./7 Days Discharge Instructions: Apply four layer compression as directed. APPLY UNNA BOOT FIRST LAYER TO UPPER PORTION OF  LOWER LEG. WOUND #2: - Lower Leg Wound Laterality: Left, Lateral Peri-Wound Care: Triamcinolone 15 (g) Other:CHANGE WEEKLY AT WOUND CENTER./7 Days Discharge Instructions: Use triamcinolone 15 (g) as directed. MIXED WITH LOTION. Prim Dressing: Endoform 2x2 in Other:CHANGE WEEKLY AT WOUND CENTER./7 Days ary Discharge Instructions: Moisten with saline Secondary Dressing: Woven Gauze Sponge, Non-Sterile 4x4 in Other:CHANGE WEEKLY AT WOUND CENTER./7 Days Discharge Instructions: Apply over primary dressing as directed. Secondary Dressing: Drawtex 4x4 in [place] [facility]./7 Days Discharge Instructions: Back prisma with the drawtex. Apply over primary dressing as directed. Com pression Wrap: FourPress (4 layer compression wrap) Other:CHANGE WEEKLY AT WOUND CENTER./7 Days Discharge Instructions: Apply four layer compression as directed. APPLY UNNA BOOT FIRST LAYER TO UPPER PORTION OF LOWER LEG. 1. I am going to try to stimulate some granulation using endoform 2. 4 layer compression bilaterally 3. I think the tremendous edema in the right leg today was a result of the wrap coming down. But I have told him that he may need to increase his pump usage to 3 times a day. 4. We will anchor the 4-layer compression with the wound at the top Electronic Signature(s) Signed: 05/04/2020 8:01:39 AM By: Linton Ham MD Entered By: Linton Ham on 05/04/2020 07:57:58 -------------------------------------------------------------------------------- SuperBill Details Patient Name: Date of Service: [name], [name] 05/03/2020 Medical Record Number: [id number] Patient Account Number: [id number] Date of Birth/Sex: Treating RN: 05-07-1946 (74 y.o. [name] Primary Care Provider: [name] Other Clinician: Referring Provider: Treating Provider/Extender: Drucilla Schmidt Weeks in Treatment: 10 Diagnosis Coding ICD-10 Codes Code Description I89.0 Lymphedema,  not elsewhere classified L97.822 Non-pressure chronic ulcer of other part of left lower leg with fat layer exposed L97.812 Non-pressure chronic ulcer of other part of right lower leg with fat layer exposed Facility Procedures CPT4 Code: 31540086 Description: 11042 - DEB SUBQ TISSUE 20 SQ CM/< ICD-10 Diagnosis Description L97.822 Non-pressure chronic ulcer of other part of left lower leg with fat layer expo L97.812 Non-pressure chronic ulcer of other part of right  lower leg with fat layer exp Modifier: sed osed Quantity: 1 Physician Procedures : CPT4 Code Description Modifier [id number] 11042 - WC PHYS SUBQ TISS 20 SQ CM ICD-10 Diagnosis Description 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

## 2020-05-08 ENCOUNTER — Encounter (HOSPITAL_COMMUNITY): Payer: Self-pay | Admitting: Physical Therapy

## 2020-05-08 NOTE — Therapy (Signed)
[facility] [place], [name], [address] Phone: [phone]   Fax:  [phone]  Patient Details  Name: [name] MRN: [id number] Date of Birth: 1945/06/05 Referring Provider:  No ref. provider found  Encounter Date: 05/08/2020  PHYSICAL THERAPY DISCHARGE SUMMARY  Visits from Start of Care: 3  Current functional level related to goals / functional outcomes: NA   Remaining deficits: NA   Education / Equipment: Patient DC per other medical reasons  Plan: Patient agrees to discharge.  Patient goals were partially met. Patient is being discharged due to a change in medical status.  ?????        4:05 PM, 05/08/20 Josue Hector PT DPT  Physical Therapist with [facility]  [phone] [place] [address] [place], [name], [address] Phone: [phone]   Fax:  [phone]

## 2020-05-09 ENCOUNTER — Other Ambulatory Visit: Payer: Self-pay

## 2020-05-09 ENCOUNTER — Encounter (HOSPITAL_BASED_OUTPATIENT_CLINIC_OR_DEPARTMENT_OTHER): Payer: Medicare Other | Admitting: Physician Assistant

## 2020-05-09 DIAGNOSIS — L97822 Non-pressure chronic ulcer of other part of left lower leg with fat layer exposed: Secondary | ICD-10-CM | POA: Diagnosis not present

## 2020-05-09 NOTE — Progress Notes (Signed)
Winslett, TERRY ([id number]) Visit Report for 05/09/2020 Arrival Information Details Patient Name: Date of Service: Grayer, [name] 05/09/2020 2:15 PM Medical Record Number: [id number] Patient Account Number: [id number] Date of Birth/Sex: Treating RN: 01/24/1946 (74 y.o. [name], [name] Primary Care Arlene Genova: [name] Other Clinician: Referring Riggs Dineen: Treating Kingjames Coury/Extender: [name] in Treatment: 10 Visit Information History Since Last Visit Added or deleted any medications: No Patient Arrived: Ambulatory Any new allergies or adverse reactions: No Arrival Time: 14:34 Had a fall or experienced change in No Accompanied By: self activities of daily living that may affect Transfer Assistance: None risk of falls: Patient Identification Verified: Yes Signs or symptoms of abuse/neglect since last visito No Secondary Verification Process Completed: Yes Hospitalized since last visit: No Patient Requires Transmission-Based Precautions: No Implantable device outside of the clinic excluding No Patient Has Alerts: Yes cellular tissue based products placed in the center Patient Alerts: Patient on Blood Thinner since last visit: on warfarin Has Dressing in Place as Prescribed: Yes Bilateral ABI: [place] Has Compression in Place as Prescribed: Yes Pain Present Now: No Electronic Signature(s) Signed: 05/09/2020 5:18:14 PM By: Deon Pilling Entered By: Deon Pilling on 05/09/2020 14:35:42 -------------------------------------------------------------------------------- Compression Therapy Details Patient Name: Date of Service: [place], [name] 05/09/2020 2:15 PM Medical Record Number: [id number] Patient Account Number: [id number] Date of Birth/Sex: Treating RN: 02/26/1946 (74 y.o. Janyth Contes Primary Care Aniela Caniglia: [name] Other Clinician: Referring Jianni Batten: Treating Kolden Dupee/Extender: [name] Weeks in  Treatment: 10 Compression Therapy Performed for Wound Assessment: Wound #1 Right,Medial Lower Leg Performed By: Clinician Levan Hurst, RN Compression Type: Four Layer Post Procedure Diagnosis Same as Pre-procedure Electronic Signature(s) Signed: 05/09/2020 7:00:57 PM By: Levan Hurst RN, [name] Entered By: Levan Hurst on 05/09/2020 15:37:42 -------------------------------------------------------------------------------- Compression Therapy Details Patient Name: Date of Service: Cagley, [name] 05/09/2020 2:15 PM Medical Record Number: [id number] Patient Account Number: [id number] Date of Birth/Sex: Treating RN: August 25, 1945 (74 y.o. Janyth Contes Primary Care Labella Zahradnik: [name] Other Clinician: Referring Alaze Garverick: Treating Bellatrix Devonshire/Extender: [name] Weeks in Treatment: 10 Compression Therapy Performed for Wound Assessment: Wound #2 Left,Lateral Lower Leg Performed By: Clinician Levan Hurst, RN Compression Type: Four Layer Post Procedure Diagnosis Same as Pre-procedure Electronic Signature(s) Signed: 05/09/2020 7:00:57 PM By: Levan Hurst RN, [name] Entered By: Levan Hurst on 05/09/2020 15:37:42 -------------------------------------------------------------------------------- Encounter Discharge Information Details Patient Name: Date of Service: Brecheen, [name] 05/09/2020 2:15 PM Medical Record Number: [id number] Patient Account Number: [id number] Date of Birth/Sex: Treating RN: Mar 18, 1946 (74 y.o. Oval Linsey Primary Care Andreanna Mikolajczak: [name] Other Clinician: Referring Aimy Sweeting: Treating Sargun Rummell/Extender: [name] in Treatment: 10 Encounter Discharge Information Items Discharge Condition: Stable Ambulatory Status: Ambulatory Discharge Destination: Home Transportation: Private Auto Accompanied By: self Schedule Follow-up Appointment: Yes Clinical Summary of Care: Patient  Declined Electronic Signature(s) Signed: 05/09/2020 5:14:06 PM By: Carlene Coria RN Entered By: Carlene Coria on 05/09/2020 16:10:45 -------------------------------------------------------------------------------- Lower Extremity Assessment Details Patient Name: Date of Service: Goetze, [name] 05/09/2020 2:15 PM Medical Record Number: [id number] Patient Account Number: [id number] Date of Birth/Sex: Treating RN: 04/13/1946 (74 y.o. [name] Primary Care Maleeka Sabatino: [name] Other Clinician: Referring Mohit Zirbes: Treating Daphane Odekirk/Extender: [name], [name] Weeks in Treatment: 10 Edema Assessment Assessed: [Left: Yes] [Right: Yes] Edema: [Left: Yes] [Right: Yes] Calf Left: Right: Point of Measurement: 46 cm From Medial Instep 41.5 cm 53 cm Ankle Left:  Right: Point of Measurement: 15 cm From Medial Instep 32 cm 35.5 cm Electronic Signature(s) Signed: 05/09/2020 5:18:14 PM By: Deon Pilling Entered By: Deon Pilling on 05/09/2020 14:42:16 -------------------------------------------------------------------------------- [place] Details Patient Name: Date of Service: [place], [name] 05/09/2020 2:15 PM Medical Record Number: [id number] Patient Account Number: [id number] Date of Birth/Sex: Treating RN: 28-Aug-1945 (74 y.o. Janyth Contes Primary Care Joab Carden: [name] Other Clinician: Referring Gavriela Cashin: Treating Brynja Marker/Extender: [name] Weeks in Treatment: 10 Active Inactive Pain, Acute or Chronic Nursing Diagnoses: Pain, acute or chronic: actual or potential Potential alteration in comfort, pain Goals: Patient will verbalize adequate pain control and receive pain control interventions during procedures as needed Date Initiated: 02/23/2020 Target Resolution Date: 06/14/2020 Goal Status: Active Patient/caregiver will verbalize comfort level met Date Initiated: 02/23/2020 Date Inactivated:  03/22/2020 Target Resolution Date: 03/23/2020 Goal Status: Met Interventions: Provide education on pain management Reposition patient for comfort Notes: Wound/Skin Impairment Nursing Diagnoses: Knowledge deficit related to ulceration/compromised skin integrity Goals: Ulcer/skin breakdown will heal within 14 weeks Date Initiated: 02/23/2020 Target Resolution Date: 06/15/2020 Goal Status: Active Interventions: Assess patient/caregiver ability to obtain necessary supplies Assess patient/caregiver ability to perform ulcer/skin care regimen upon admission and as needed Provide education on ulcer and skin care Treatment Activities: Skin care regimen initiated : 02/23/2020 Topical wound management initiated : 02/23/2020 Notes: Electronic Signature(s) Signed: 05/09/2020 7:00:57 PM By: Levan Hurst RN, [name] Entered By: Levan Hurst on 05/09/2020 17:54:20 -------------------------------------------------------------------------------- Pain Assessment Details Patient Name: Date of Service: Hestand, Marion 05/09/2020 2:15 PM Medical Record Number: [id number] Patient Account Number: [id number] Date of Birth/Sex: Treating RN: 1945-12-20 (74 y.o. [name] Primary Care Sherron Mummert: [name] Other Clinician: Referring Ryley Teater: Treating Micha Erck/Extender: [name] Weeks in Treatment: 10 Active Problems Location of Pain Severity and Description of Pain Patient Has Paino No Site Locations Rate the pain. Current Pain Level: 0 Pain Management and Medication Current Pain Management: Medication: No Cold Application: No Rest: No Massage: No Activity: No T.E.N.S.: No Heat Application: No Leg drop or elevation: No Is the Current Pain Management Adequate: Adequate How does your wound impact your activities of daily livingo Sleep: No Bathing: No Appetite: No Relationship With Others: No Bladder Continence: No Emotions: No Bowel Continence:  No Work: No Toileting: No Drive: No Dressing: No Hobbies: No Electronic Signature(s) Signed: 05/09/2020 5:18:14 PM By: Deon Pilling Entered By: Deon Pilling on 05/09/2020 14:36:16 -------------------------------------------------------------------------------- Patient/Caregiver Education Details Patient Name: Date of Service: Mcguirk, [name] 12/22/2021andnbsp2:15 PM Medical Record Number: [id number] Patient Account Number: [id number] Date of Birth/Gender: Treating RN: 22-Mar-1946 (74 y.o. Janyth Contes Primary Care Physician: [name] Other Clinician: Referring Physician: Treating Physician/Extender: [name] in Treatment: 10 Education Assessment Education Provided To: Patient Education Topics Provided Wound/Skin Impairment: Methods: Explain/Verbal Responses: State content correctly Electronic Signature(s) Signed: 05/09/2020 7:00:57 PM By: Levan Hurst RN, [name] Entered By: Levan Hurst on 05/09/2020 17:54:29 -------------------------------------------------------------------------------- Wound Assessment Details Patient Name: Date of Service: [place], [name] 05/09/2020 2:15 PM Medical Record Number: [id number] Patient Account Number: [id number] Date of Birth/Sex: Treating RN: [birth year] (74 y.o. [name] Primary Care Jearl Soto: [name] Other Clinician: Referring Asharia Lotter: Treating Juan Olthoff/Extender: [name] Weeks in Treatment: 10 Wound Status Wound Number: 1 Primary Lymphedema Etiology: Wound Location: Right, Medial Lower Leg Wound Status: Open Wounding Event: Blister Comorbid Lymphedema, Deep Vein Thrombosis, Hypertension, Date Acquired: 01/15/2020 History: Received Radiation  Weeks Of Treatment: 10 Clustered Wound: No Wound Measurements Length: (cm) 0.3 Width: (cm) 1.5 Depth: (cm) 0.6 Area: (cm) 0.353 Volume: (cm) 0.212 % Reduction in Area: 55% % Reduction in Volume:  -168.4% Epithelialization: Small (1-33%) Tunneling: No Undermining: No Wound Description Classification: Full Thickness Without Exposed Support Structures Wound Margin: Distinct, outline attached Exudate Amount: Medium Exudate Type: Serosanguineous Exudate Color: red, brown Foul Odor After Cleansing: No Slough/Fibrino Yes Wound Bed Granulation Amount: Large (67-100%) Exposed Structure Granulation Quality: Red, Pink Fascia Exposed: No Necrotic Amount: Small (1-33%) Fat Layer (Subcutaneous Tissue) Exposed: Yes Necrotic Quality: Adherent Slough Tendon Exposed: No Muscle Exposed: No Joint Exposed: No Bone Exposed: No Treatment Notes Wound #1 (Lower Leg) Wound Laterality: Right, Medial Cleanser Peri-Wound Care Triamcinolone 15 (g) Discharge Instruction: Use triamcinolone 15 (g) as directed. MIXED WITH LOTION. Sween Lotion (Moisturizing lotion) Discharge Instruction: Apply moisturizing lotion as directed Topical Primary Dressing Promogran Prisma Matrix, 4.34 (sq in) (silver collagen) Discharge Instruction: Moisten collagen with saline or hydrogel Secondary Dressing Woven Gauze Sponge, Non-Sterile 4x4 in Discharge Instruction: Apply over primary dressing as directed. Drawtex 4x4 in Discharge Instruction: Back prisma with the drawtex. Apply over primary dressing as directed. Secured With Compression Wrap FourPress (4 layer compression wrap) Discharge Instruction: Apply four layer compression as directed. APPLY UNNA BOOT FIRST LAYER TO UPPER PORTION OF LOWER LEG. Compression Stockings Add-Ons Electronic Signature(s) Signed: 05/09/2020 5:18:14 PM By: Deon Pilling Entered By: Deon Pilling on 05/09/2020 14:42:39 -------------------------------------------------------------------------------- Wound Assessment Details Patient Name: Date of Service: [place], [name] 05/09/2020 2:15 PM Medical Record Number: [id number] Patient Account Number: [id number] Date of  Birth/Sex: Treating RN: 1945/06/17 (74 y.o. [name], [name] Primary Care Eboney Claybrook: [name] Other Clinician: Referring Elesa Garman: Treating Earla Charlie/Extender: [name] Weeks in Treatment: 10 Wound Status Wound Number: 2 Primary Lymphedema Etiology: Wound Location: Left, Lateral Lower Leg Wound Status: Open Wounding Event: Blister Comorbid Lymphedema, Deep Vein Thrombosis, Hypertension, Date Acquired: 01/15/2020 History: Received Radiation Weeks Of Treatment: 10 Clustered Wound: No Wound Measurements Length: (cm) 0.3 Width: (cm) 0.2 Depth: (cm) 0.4 Area: (cm) 0.047 Volume: (cm) 0.019 % Reduction in Area: 98.6% % Reduction in Volume: 99.6% Epithelialization: Large (67-100%) Tunneling: No Undermining: No Wound Description Classification: Full Thickness Without Exposed Support Struct Wound Margin: Flat and Intact Exudate Amount: Small Exudate Type: Serous Exudate Color: amber ures Foul Odor After Cleansing: No Slough/Fibrino No Wound Bed Granulation Amount: Large (67-100%) Exposed Structure Granulation Quality: Pink, Pale Fascia Exposed: No Necrotic Amount: None Present (0%) Fat Layer (Subcutaneous Tissue) Exposed: Yes Tendon Exposed: No Muscle Exposed: No Joint Exposed: No Bone Exposed: No Treatment Notes Wound #2 (Lower Leg) Wound Laterality: Left, Lateral Cleanser Peri-Wound Care Triamcinolone 15 (g) Discharge Instruction: Use triamcinolone 15 (g) as directed. MIXED WITH LOTION. Sween Lotion (Moisturizing lotion) Discharge Instruction: Apply moisturizing lotion as directed Topical Primary Dressing Promogran Prisma Matrix, 4.34 (sq in) (silver collagen) Discharge Instruction: Moisten collagen with saline or hydrogel Secondary Dressing Woven Gauze Sponge, Non-Sterile 4x4 in Discharge Instruction: Apply over primary dressing as directed. Secured With Compression Wrap FourPress (4 layer compression wrap) Discharge  Instruction: Apply four layer compression as directed. APPLY UNNA BOOT FIRST LAYER TO UPPER PORTION OF LOWER LEG. Compression Stockings Add-Ons Electronic Signature(s) Signed: 05/09/2020 5:18:14 PM By: Deon Pilling Entered By: Deon Pilling on 05/09/2020 14:43:00 -------------------------------------------------------------------------------- Vitals Details Patient Name: Date of Service: Ahner, [name] 05/09/2020 2:15 PM Medical Record Number: [id number] Patient Account Number: [id number] Date of Birth/Sex: Treating RN: September 03, 1945 (74 y.o.  [name], [name] Primary Care Nethan Caudillo: [name] Other Clinician: Referring Analuisa Tudor: Treating Ramyah Pankowski/Extender: [name] Weeks in Treatment: 10 Vital Signs Time Taken: 14:30 Temperature (F): 97.8 Pulse (bpm): 110 Respiratory Rate (breaths/min): 20 Blood Pressure (mmHg): 159/64 Reference Range: 80 - 120 mg / dl Electronic Signature(s) Signed: 05/09/2020 5:18:14 PM By: Deon Pilling Entered By: Deon Pilling on 05/09/2020 14:36:01

## 2020-05-09 NOTE — Progress Notes (Signed)
[name], [name] ([id number]) Visit Report for 05/09/2020 Chief Complaint Document Details Patient Name: Date of Service: [name], [name] 05/09/2020 2:15 PM Medical Record Number: [id number] Patient Account Number: [id number] Date of Birth/Sex: Treating RN: Apr 24, 1946 (74 y.o. Janyth Contes Primary Care Provider: [name] Other Clinician: Referring Provider: Treating Provider/Extender: [name] Weeks in Treatment: 10 Information Obtained from: Patient Chief Complaint 02/23/2020; patient is here for wounds on his bilateral lower legs in the setting of severe lymphedema Electronic Signature(s) Signed: 05/09/2020 2:29:46 PM By: Worthy Keeler PA-C Entered By: Worthy Keeler on 05/09/2020 14:29:46 -------------------------------------------------------------------------------- HPI Details Patient Name: Date of Service: [name], [name] 05/09/2020 2:15 PM Medical Record Number: [id number] Patient Account Number: [id number] Date of Birth/Sex: Treating RN: 1945-10-17 (74 y.o. Janyth Contes Primary Care Provider: [name] Other Clinician: Referring Provider: Treating Provider/Extender: [name] Weeks in Treatment: 10 History of Present Illness HPI Description: ADMISSION 02/23/2020 Patient is a 74 year old man who lives in [place] who arrives accompanied by his wife. He has a history of chronic lymphedema and venous insufficiency in his bilateral lower legs which may have something to do that with having a history of DVT as well as being treated for prostate cancer. In any case he recently got compression pumps at home but compliance has been an issue here. He has compression stockings however they are probably not sufficient enough to control swelling. They tell us that things deteriorated for him in late August he was admitted to [facility] for 7 days. This was with cellulitis I think of his bilateral lower legs. Discharge  he was noted to have wounds on his bilateral lower legs. He was discharged on Bactrim. They tried to get him home health through [facility] part [facility] of course they declined him. His wife is been wrapping these applying some form of silver foam dressing. He has a history of wounds before although nothing that would not heal with basic home topical dressings. He has 2 areas on the left medial, left anterior and left lateral and a smaller area on the right medial. All of these have considerable depth. Past medical history includes iron deficiency anemia, lymphedema followed by the rehab center at [facility] with lymphedema wraps I believe, DVT on chronic anticoagulation, prostate cancer, chronic venous insufficiency, hypertension. As mentioned he has compression pumps but does not use them. ABIs in our clinic were noncompressible bilaterally 10/14; patient with severe bilateral lymphedema right greater than left. He came in with bilateral lower extremity wounds left greater than right. Even though the right side has more of the edema most of the wounds here almost closed on the right medial. He has 3 remaining wounds on the left We have been using silver alginate under 4-layer compression I have been trying to get him to be compliant with his external compression pumps 10/21; patient with 3 small wounds on the left leg and 1 on the right medial in the setting of severe lymphedema and chronic venous insufficiency. We have been using silver alginate under 4-layer compression he is using his external compression pumps twice a day 11/4; ARTERIAL STUDIES on the right show an ABI of 1.02 TBI of 0.858 with biphasic waveforms on the left 0.98 with a TBI of 0.55 and biphasic waveforms. Does not look like he has significant arterial disease. We are treating him for lymphedema he has compression pumps. He has punched-out areas on the left  anterior left lateral and right medial lower  extremities 11/11; after we obtained his arterial studies I put him in 4 layer compression. He is using his compression pumps probably once a day although I have asked him to do twice. Primary dressing to the wound is silver collagen he has severe lymphedema likely secondary to chronic venous insufficiency. Wounds on the left lateral, left medial and left anterior and a small area on the right medial 12/2; the area on the right anterior lower leg has healed. We initially thought that the area medially had healed as well however when her discharge nurse came in she detected fluid in the wound simply opened up. This is actually worse than I remember this pain. The area on the left lateral potentially slightly smaller He is also complaining about pain in his left hand he says that this is actually been getting some better he has been using topical creams on this. She asked that I look at this 12/9 after last weeks issues we have 2 wounds one on the right medial lower leg and 1 on the left lateral. Both of these are in the same condition. I think because of thickened skin secondary to chronic lymphedema these wounds actually have depth of almost 0.8 cm. 12/16; the patient has 2 small but deep wounds one on the right medial and one on the left lateral. The right medial is actually the worst of these. He arrives in clinic today with absolutely terrible edema in the right leg apparently his 4-layer wrap fell down to just above his ankle he did not think about this he is apparently been continuing to use his compression pump twice a day. The left leg looks a lot better. 05/09/2020 upon evaluation today patient appears to be doing decently well in regard to his wounds. Everything is measuring smaller the right leg still has a little bit deeper wound in the left seems to be almost completely healed in my opinion I am very pleased in general with how things are progressing. He has a 4- layer compression wrap we  have been using endoform today we will probably have to use collagen just based on the fact that we do not have endoform it is on order. Electronic Signature(s) Signed: 05/09/2020 3:37:55 PM By: Worthy Keeler PA-C Entered By: Worthy Keeler on 05/09/2020 15:37:54 -------------------------------------------------------------------------------- Physical Exam Details Patient Name: Date of Service: [name], [name] 05/09/2020 2:15 PM Medical Record Number: [id number] Patient Account Number: [id number] Date of Birth/Sex: Treating RN: Feb 25, 1946 (74 y.o. Janyth Contes Primary Care Provider: [name] Other Clinician: Referring Provider: Treating Provider/Extender: [name] Weeks in Treatment: 10 Constitutional Obese and well-hydrated in no acute distress. Respiratory normal breathing without difficulty. Psychiatric this patient is able to make decisions and demonstrates good insight into disease process. Alert and Oriented x 3. pleasant and cooperative. Notes Upon inspection patient's wound bed actually showed signs of good granulation there was no need for significant sharp debridement at this point which is great news and overall very pleased in that regard. No fevers, chills, nausea, vomiting, or diarrhea. Electronic Signature(s) Signed: 05/09/2020 3:38:24 PM By: Worthy Keeler PA-C Entered By: Worthy Keeler on 05/09/2020 15:38:24 -------------------------------------------------------------------------------- Physician Orders Details Patient Name: Date of Service: [name], [name] 05/09/2020 2:15 PM Medical Record Number: [id number] Patient Account Number: [id number] Date of Birth/Sex: Treating RN: 29-Dec-1945 (74 y.o. Janyth Contes Primary Care Provider: [name] Other Clinician: Referring Provider:  Treating Provider/Extender: [name] Weeks in Treatment: 10 Verbal / Phone Orders: No Diagnosis  Coding ICD-10 Coding Code Description I89.0 Lymphedema, not elsewhere classified L97.822 Non-pressure chronic ulcer of other part of left lower leg with fat layer exposed L97.812 Non-pressure chronic ulcer of other part of right lower leg with fat layer exposed Follow-up Appointments ppointment in 2 weeks. - Thursday 1/6 Return A Nurse Visit: - 1 week for rewrap Bathing/ Shower/ Hygiene May shower with protection but do not get wound dressing(s) wet. - use a cast protector. Edema Control - Lymphedema / SCD / Other Bilateral Lower Extremities Lymphedema Pumps. Use Lymphedema pumps on leg(s) 2-3 times a day for 45-60 minutes. If wearing any wraps or hose, do not remove them. Continue exercising as instructed. - 3 times a day. Elevate legs to the level of the heart or above for 30 minutes daily and/or when sitting, a frequency of: - 3-4 times Avoid standing for long periods of time. Exercise regularly Wound Treatment Wound #1 - Lower Leg Wound Laterality: Right, Medial Peri-Wound Care: Triamcinolone 15 (g) Other:CHANGE WEEKLY AT WOUND CENTER./7 Days Discharge Instructions: Use triamcinolone 15 (g) as directed. MIXED WITH LOTION. Peri-Wound Care: Sween Lotion (Moisturizing lotion) Other:CHANGE WEEKLY AT WOUND CENTER./7 Days Discharge Instructions: Apply moisturizing lotion as directed Prim Dressing: Promogran Prisma Matrix, 4.34 (sq in) (silver collagen) Other:CHANGE WEEKLY AT WOUND CENTER./7 Days ary Discharge Instructions: Moisten collagen with saline or hydrogel Secondary Dressing: Woven Gauze Sponge, Non-Sterile 4x4 in Other:CHANGE WEEKLY AT WOUND CENTER./7 Days Discharge Instructions: Apply over primary dressing as directed. Secondary Dressing: Drawtex 4x4 in [place] [facility]./7 Days Discharge Instructions: Back prisma with the drawtex. Apply over primary dressing as directed. Compression Wrap: FourPress (4 layer compression wrap) Other:CHANGE WEEKLY AT WOUND  CENTER./7 Days Discharge Instructions: Apply four layer compression as directed. APPLY UNNA BOOT FIRST LAYER TO UPPER PORTION OF LOWER LEG. Wound #2 - Lower Leg Wound Laterality: Left, Lateral Peri-Wound Care: Triamcinolone 15 (g) Other:CHANGE WEEKLY AT WOUND CENTER./7 Days Discharge Instructions: Use triamcinolone 15 (g) as directed. MIXED WITH LOTION. Peri-Wound Care: Sween Lotion (Moisturizing lotion) Other:CHANGE WEEKLY AT WOUND CENTER./7 Days Discharge Instructions: Apply moisturizing lotion as directed Prim Dressing: Promogran Prisma Matrix, 4.34 (sq in) (silver collagen) Other:CHANGE WEEKLY AT WOUND CENTER./7 Days ary Discharge Instructions: Moisten collagen with saline or hydrogel Secondary Dressing: Woven Gauze Sponge, Non-Sterile 4x4 in Other:CHANGE WEEKLY AT WOUND CENTER./7 Days Discharge Instructions: Apply over primary dressing as directed. Compression Wrap: FourPress (4 layer compression wrap) Other:CHANGE WEEKLY AT WOUND CENTER./7 Days Discharge Instructions: Apply four layer compression as directed. APPLY UNNA BOOT FIRST LAYER TO UPPER PORTION OF LOWER LEG. Electronic Signature(s) Signed: 05/09/2020 5:13:59 PM By: Worthy Keeler PA-C Signed: 05/09/2020 7:00:57 PM By: Levan Hurst RN, [name] Entered By: Levan Hurst on 05/09/2020 15:39:27 -------------------------------------------------------------------------------- Problem List Details Patient Name: Date of Service: [name], [name] 05/09/2020 2:15 PM Medical Record Number: [id number] Patient Account Number: [id number] Date of Birth/Sex: Treating RN: July 17, 1945 (74 y.o. Janyth Contes Primary Care Provider: [name] Other Clinician: Referring Provider: Treating Provider/Extender: [name] Weeks in Treatment: 10 Active Problems ICD-10 Encounter Code Description Active Date MDM Diagnosis I89.0 Lymphedema, not elsewhere classified 02/23/2020 No Yes L97.822 Non-pressure chronic  ulcer of other part of left lower leg with fat layer exposed10/11/2019 No Yes L97.812 Non-pressure chronic ulcer of other part of right lower leg with fat layer 02/23/2020 No Yes exposed Inactive Problems Resolved Problems Electronic Signature(s)  Signed: 05/09/2020 2:29:30 PM By: Worthy Keeler PA-C Entered By: Worthy Keeler on 05/09/2020 14:29:30 -------------------------------------------------------------------------------- Progress Note Details Patient Name: Date of Service: [name], [name] 05/09/2020 2:15 PM Medical Record Number: [id number] Patient Account Number: [id number] Date of Birth/Sex: Treating RN: 13-Jun-1945 (74 y.o. Janyth Contes Primary Care Provider: [name] Other Clinician: Referring Provider: Treating Provider/Extender: [name] Weeks in Treatment: 10 Subjective Chief Complaint Information obtained from Patient 02/23/2020; patient is here for wounds on his bilateral lower legs in the setting of severe lymphedema History of Present Illness (HPI) ADMISSION 02/23/2020 Patient is a 74 year old man who lives in [place] who arrives accompanied by his wife. He has a history of chronic lymphedema and venous insufficiency in his bilateral lower legs which may have something to do that with having a history of DVT as well as being treated for prostate cancer. In any case he recently got compression pumps at home but compliance has been an issue here. He has compression stockings however they are probably not sufficient enough to control swelling. They tell us that things deteriorated for him in late August he was admitted to [facility] for 7 days. This was with cellulitis I think of his bilateral lower legs. Discharge he was noted to have wounds on his bilateral lower legs. He was discharged on Bactrim. They tried to get him home health through [facility] part [facility] of course they declined him. His wife is been wrapping these  applying some form of silver foam dressing. He has a history of wounds before although nothing that would not heal with basic home topical dressings. He has 2 areas on the left medial, left anterior and left lateral and a smaller area on the right medial. All of these have considerable depth. Past medical history includes iron deficiency anemia, lymphedema followed by the rehab center at [facility] with lymphedema wraps I believe, DVT on chronic anticoagulation, prostate cancer, chronic venous insufficiency, hypertension. As mentioned he has compression pumps but does not use them. ABIs in our clinic were noncompressible bilaterally 10/14; patient with severe bilateral lymphedema right greater than left. He came in with bilateral lower extremity wounds left greater than right. Even though the right side has more of the edema most of the wounds here almost closed on the right medial. He has 3 remaining wounds on the left We have been using silver alginate under 4-layer compression I have been trying to get him to be compliant with his external compression pumps 10/21; patient with 3 small wounds on the left leg and 1 on the right medial in the setting of severe lymphedema and chronic venous insufficiency. We have been using silver alginate under 4-layer compression he is using his external compression pumps twice a day 11/4; ARTERIAL STUDIES on the right show an ABI of 1.02 TBI of 0.858 with biphasic waveforms on the left 0.98 with a TBI of 0.55 and biphasic waveforms. Does not look like he has significant arterial disease. We are treating him for lymphedema he has compression pumps. He has punched-out areas on the left anterior left lateral and right medial lower extremities 11/11; after we obtained his arterial studies I put him in 4 layer compression. He is using his compression pumps probably once a day although I have asked him to do twice. Primary dressing to the wound is silver collagen he has  severe lymphedema likely secondary to chronic venous insufficiency. Wounds on the left lateral,  left medial and left anterior and a small area on the right medial 12/2; the area on the right anterior lower leg has healed. We initially thought that the area medially had healed as well however when her discharge nurse came in she detected fluid in the wound simply opened up. This is actually worse than I remember this pain. The area on the left lateral potentially slightly smaller He is also complaining about pain in his left hand he says that this is actually been getting some better he has been using topical creams on this. She asked that I look at this 12/9 after last weeks issues we have 2 wounds one on the right medial lower leg and 1 on the left lateral. Both of these are in the same condition. I think because of thickened skin secondary to chronic lymphedema these wounds actually have depth of almost 0.8 cm. 12/16; the patient has 2 small but deep wounds one on the right medial and one on the left lateral. The right medial is actually the worst of these. He arrives in clinic today with absolutely terrible edema in the right leg apparently his 4-layer wrap fell down to just above his ankle he did not think about this he is apparently been continuing to use his compression pump twice a day. The left leg looks a lot better. 05/09/2020 upon evaluation today patient appears to be doing decently well in regard to his wounds. Everything is measuring smaller the right leg still has a little bit deeper wound in the left seems to be almost completely healed in my opinion I am very pleased in general with how things are progressing. He has a 4- layer compression wrap we have been using endoform today we will probably have to use collagen just based on the fact that we do not have endoform it is on order. Objective Constitutional Obese and well-hydrated in no acute distress. Vitals Time Taken: 2:30 PM,  Temperature: 97.8 F, Pulse: 110 bpm, Respiratory Rate: 20 breaths/min, Blood Pressure: 159/64 mmHg. Respiratory normal breathing without difficulty. Psychiatric this patient is able to make decisions and demonstrates good insight into disease process. Alert and Oriented x 3. pleasant and cooperative. General Notes: Upon inspection patient's wound bed actually showed signs of good granulation there was no need for significant sharp debridement at this point which is great news and overall very pleased in that regard. No fevers, chills, nausea, vomiting, or diarrhea. Integumentary (Hair, Skin) Wound #1 status is Open. Original cause of wound was Blister. The wound is located on the Right,Medial Lower Leg. The wound measures 0.3cm length x 1.5cm width x 0.6cm depth; 0.353cm^2 area and 0.212cm^3 volume. There is Fat Layer (Subcutaneous Tissue) exposed. There is no tunneling or undermining noted. There is a medium amount of serosanguineous drainage noted. The wound margin is distinct with the outline attached to the wound base. There is large (67-100%) red, pink granulation within the wound bed. There is a small (1-33%) amount of necrotic tissue within the wound bed including Adherent Slough. Wound #2 status is Open. Original cause of wound was Blister. The wound is located on the Left,Lateral Lower Leg. The wound measures 0.3cm length x 0.2cm width x 0.4cm depth; 0.047cm^2 area and 0.019cm^3 volume. There is Fat Layer (Subcutaneous Tissue) exposed. There is no tunneling or undermining noted. There is a small amount of serous drainage noted. The wound margin is flat and intact. There is large (67-100%) pink, pale granulation within the wound bed. There is  no necrotic tissue within the wound bed. Assessment Active Problems ICD-10 Lymphedema, not elsewhere classified Non-pressure chronic ulcer of other part of left lower leg with fat layer exposed Non-pressure chronic ulcer of other part of right  lower leg with fat layer exposed Procedures Wound #1 Pre-procedure diagnosis of Wound #1 is a Lymphedema located on the Right,Medial Lower Leg . There was a Four Layer Compression Therapy Procedure by Levan Hurst, RN. Post procedure Diagnosis Wound #1: Same as Pre-Procedure Wound #2 Pre-procedure diagnosis of Wound #2 is a Lymphedema located on the Left,Lateral Lower Leg . There was a Four Layer Compression Therapy Procedure by Levan Hurst, RN. Post procedure Diagnosis Wound #2: Same as Pre-Procedure Plan Follow-up Appointments: Return Appointment in 2 weeks. - Thursday 1/6 Nurse Visit: - 1 week for rewrap Bathing/ Shower/ Hygiene: May shower with protection but do not get wound dressing(s) wet. - use a cast protector. Edema Control - Lymphedema / SCD / Other: Lymphedema Pumps. Use Lymphedema pumps on leg(s) 2-3 times a day for 45-60 minutes. If wearing any wraps or hose, do not remove them. Continue exercising as instructed. - 3 times a day. Elevate legs to the level of the heart or above for 30 minutes daily and/or when sitting, a frequency of: - 3-4 times Avoid standing for long periods of time. Exercise regularly WOUND #1: - Lower Leg Wound Laterality: Right, Medial Peri-Wound Care: Triamcinolone 15 (g) Other:CHANGE WEEKLY AT WOUND CENTER./7 Days Discharge Instructions: Use triamcinolone 15 (g) as directed. MIXED WITH LOTION. Peri-Wound Care: Sween Lotion (Moisturizing lotion) Other:CHANGE WEEKLY AT WOUND CENTER./7 Days Discharge Instructions: Apply moisturizing lotion as directed Prim Dressing: Promogran Prisma Matrix, 4.34 (sq in) (silver collagen) Other:CHANGE WEEKLY AT WOUND CENTER./7 Days ary Discharge Instructions: Moisten collagen with saline or hydrogel Secondary Dressing: Woven Gauze Sponge, Non-Sterile 4x4 in Other:CHANGE WEEKLY AT WOUND CENTER./7 Days Discharge Instructions: Apply over primary dressing as directed. Secondary Dressing: Drawtex 4x4 in [place]./7 Days Discharge Instructions: Back prisma with the drawtex. Apply over primary dressing as directed. Com pression Wrap: FourPress (4 layer compression wrap) Other:CHANGE WEEKLY AT WOUND CENTER./7 Days Discharge Instructions: Apply four layer compression as directed. APPLY UNNA BOOT FIRST LAYER TO UPPER PORTION OF LOWER LEG. WOUND #2: - Lower Leg Wound Laterality: Left, Lateral Peri-Wound Care: Triamcinolone 15 (g) Other:CHANGE WEEKLY AT WOUND CENTER./7 Days Discharge Instructions: Use triamcinolone 15 (g) as directed. MIXED WITH LOTION. Peri-Wound Care: Sween Lotion (Moisturizing lotion) Other:CHANGE WEEKLY AT WOUND CENTER./7 Days Discharge Instructions: Apply moisturizing lotion as directed Prim Dressing: Promogran Prisma Matrix, 4.34 (sq in) (silver collagen) Other:CHANGE WEEKLY AT WOUND CENTER./7 Days ary Discharge Instructions: Moisten collagen with saline or hydrogel Secondary Dressing: Woven Gauze Sponge, Non-Sterile 4x4 in Other:CHANGE WEEKLY AT WOUND CENTER./7 Days Discharge Instructions: Apply over primary dressing as directed. Com pression Wrap: FourPress (4 layer compression wrap) Other:CHANGE WEEKLY AT WOUND CENTER./7 Days Discharge Instructions: Apply four layer compression as directed. APPLY UNNA BOOT FIRST LAYER TO UPPER PORTION OF LOWER LEG. 1. Would recommend currently that we actually go ahead and continue with the collagen-based products in place of endoform will use silver collagen today obviously I think that the patient has responded well to the endoform and we may go back to that next week that is just on order currently. 2. I am also can recommend we continue with a 4-layer compression wrap that is doing a great job for the patient. 3. He should be using his pumps 3 times a day I am  not really certain how often he is really doing this but nonetheless I think that is of utmost importance. We will see patient back for reevaluation in 1 week here in  the clinic. If anything worsens or changes patient will contact our office for additional recommendations. Electronic Signature(s) Signed: 05/09/2020 3:38:49 PM By: Worthy Keeler PA-C Entered By: Worthy Keeler on 05/09/2020 15:38:48 -------------------------------------------------------------------------------- SuperBill Details Patient Name: Date of Service: [name], [name] 05/09/2020 Medical Record Number: [id number] Patient Account Number: [id number] Date of Birth/Sex: Treating RN: 21-Feb-1946 (74 y.o. Janyth Contes Primary Care Provider: [name] Other Clinician: Referring Provider: Treating Provider/Extender: [name] Weeks in Treatment: 10 Diagnosis Coding ICD-10 Codes Code Description I89.0 Lymphedema, not elsewhere classified L97.822 Non-pressure chronic ulcer of other part of left lower leg with fat layer exposed L97.812 Non-pressure chronic ulcer of other part of right lower leg with fat layer exposed Facility Procedures CPT4: Code [id number] Description: 81 BILATERAL: Application of multi-layer venous compression system; leg (below knee), including ankle and t. Modifier: Quantity: 1 Physician Procedures : CPT4 Code Description Modifier [id number] - WC PHYS LEVEL 3 - EST PT ICD-10 Diagnosis Description I89.0 Lymphedema, not elsewhere classified L97.822 Non-pressure chronic ulcer of other part of left lower leg with fat layer exposed L97.812  Non-pressure chronic ulcer of other part of right lower leg with fat layer exposed Quantity: 1 Electronic Signature(s) Signed: 05/09/2020 3:40:03 PM By: Worthy Keeler PA-C Entered By: Worthy Keeler on 05/09/2020 15:40:02

## 2020-05-16 ENCOUNTER — Encounter (HOSPITAL_BASED_OUTPATIENT_CLINIC_OR_DEPARTMENT_OTHER): Payer: Medicare Other | Admitting: Physician Assistant

## 2020-05-16 ENCOUNTER — Other Ambulatory Visit: Payer: Self-pay

## 2020-05-16 DIAGNOSIS — L97822 Non-pressure chronic ulcer of other part of left lower leg with fat layer exposed: Secondary | ICD-10-CM | POA: Diagnosis not present

## 2020-05-22 NOTE — Progress Notes (Signed)
Brandi, KEYGAN ([id number]) Visit Report for 05/16/2020 Arrival Information Details Patient Name: Date of Service: [name], [name] 05/16/2020 12:30 PM Medical Record Number: [id number] Patient Account Number: [id number] Date of Birth/Sex: Treating RN: 08-17-1945 (75 y.o. Damaris Schooner Primary Care Clarnce Homan: [name] Other Clinician: Referring Tava Peery: Treating Marycatherine Maniscalco/Extender: [name] in Treatment: 11 Visit Information History Since Last Visit Added or deleted any medications: No Patient Arrived: Ambulatory Any new allergies or adverse reactions: No Arrival Time: 12:55 Had a fall or experienced change in No Accompanied By: slef activities of daily living that may affect Transfer Assistance: None risk of falls: Patient Identification Verified: Yes Signs or symptoms of abuse/neglect since last visito No Secondary Verification Process Completed: Yes Hospitalized since last visit: No Patient Requires Transmission-Based Precautions: No Implantable device outside of the clinic excluding No Patient Has Alerts: Yes cellular tissue based products placed in the center Patient Alerts: Patient on Blood Thinner since last visit: on warfarin Has Dressing in Place as Prescribed: Yes Bilateral ABI: Horatio Pain Present Now: No Electronic Signature(s) Signed: 05/22/2020 11:46:23 AM By: Karl Ito Entered By: Karl Ito on 05/16/2020 12:55:29 -------------------------------------------------------------------------------- Compression Therapy Details Patient Name: Date of Service: [place]ke, [name] 05/16/2020 12:30 PM Medical Record Number: [id number] Patient Account Number: [id number] Date of Birth/Sex: Treating RN: Mar 07, 1946 (74 y.o. Damaris Schooner Primary Care Brad Lieurance: [name] Other Clinician: Referring Khaniya Tenaglia: Treating Lajuan Godbee/Extender: [name] Weeks in Treatment: 11 Compression Therapy  Performed for Wound Assessment: Wound #2 Left,Lateral Lower Leg Performed By: Michael Litter, Compression Type: Four Layer Electronic Signature(s) Signed: 05/16/2020 1:46:21 PM By: Benjaman Kindler EMT/HBOT/SD Entered By: Benjaman Kindler on 05/16/2020 13:43:38 -------------------------------------------------------------------------------- Compression Therapy Details Patient Name: Date of Service: [place], [name] 05/16/2020 12:30 PM Medical Record Number: [id number] Patient Account Number: [id number] Date of Birth/Sex: Treating RN: Dec 22, 1945 (74 y.o. Damaris Schooner Primary Care Mackenzye Mackel: Other Clinician: [name] Referring Elisabel Hanover: Treating Chase Arnall/Extender: Leveda Anna, [name] Weeks in Treatment: 11 Compression Therapy Performed for Wound Assessment: Wound #1 Right,Medial Lower Leg Performed By: Michael Litter, Compression Type: Four Layer Electronic Signature(s) Signed: 05/16/2020 1:46:21 PM By: Benjaman Kindler EMT/HBOT/SD Entered By: Benjaman Kindler on 05/16/2020 13:43:57 -------------------------------------------------------------------------------- Encounter Discharge Information Details Patient Name: Date of Service: [place], [name] 05/16/2020 12:30 PM Medical Record Number: [id number] Patient Account Number: [id number] Date of Birth/Sex: Treating RN: 02-13-1946 (75 y.o. Damaris Schooner Primary Care Aleighna Wojtas: [name] Other Clinician: Referring Tasman Zapata: Treating Tajuana Kniskern/Extender: [name] in Treatment: 11 Encounter Discharge Information Items Discharge Condition: Stable Ambulatory Status: Ambulatory Discharge Destination: Home Transportation: Private Auto Accompanied By: self Schedule Follow-up Appointment: Yes Clinical Summary of Care: Patient Declined Electronic Signature(s) Signed: 05/16/2020 1:46:21 PM By: Benjaman Kindler EMT/HBOT/SD Entered By: Benjaman Kindler on 05/16/2020  13:44:38 -------------------------------------------------------------------------------- Patient/Caregiver Education Details Patient Name: Date of Service: [place], [name] 12/29/2021andnbsp12:30 PM Medical Record Number: [id number] Patient Account Number: [id number] Date of Birth/Gender: Treating RN: 27-Mar-1946 (75 y.o. Damaris Schooner Primary Care Physician: [name] Other Clinician: Referring Physician: Treating Physician/Extender: [name] in Treatment: 11 Education Assessment Education Provided To: Patient Education Topics Provided Electronic Signature(s) Signed: 05/16/2020 1:46:21 PM By: Benjaman Kindler EMT/HBOT/SD Entered By: Benjaman Kindler on 05/16/2020 13:44:23 -------------------------------------------------------------------------------- Wound Assessment Details Patient Name: Date of Service: [place], [name] 05/16/2020 12:30 PM Medical Record Number: [id number] Patient Account Number: [id number] Date of Birth/Sex: Treating RN:  July 29, 1945 (75 y.o. [name] Primary Care Arling Cerone: [name] Other Clinician: Referring Tramon Crescenzo: Treating Payten Beaumier/Extender: [name] Weeks in Treatment: 11 Wound Status Wound Number: 1 Primary Etiology: Lymphedema Wound Location: Right, Medial Lower Leg Wound Status: Open Wounding Event: Blister Date Acquired: 01/15/2020 Weeks Of Treatment: 11 Clustered Wound: No Wound Measurements Length: (cm) 0.3 Width: (cm) 1.5 Depth: (cm) 0.6 Area: (cm) 0.353 Volume: (cm) 0.212 % Reduction in Area: 55% % Reduction in Volume: -168.4% Wound Description Classification: Full Thickness Without Exposed Support Structur es Treatment Notes Wound #1 (Lower Leg) Wound Laterality: Right, Medial Cleanser Peri-Wound Care Triamcinolone 15 (g) Discharge Instruction: Use triamcinolone 15 (g) as directed. MIXED WITH LOTION. Sween Lotion (Moisturizing lotion) Discharge  Instruction: Apply moisturizing lotion as directed Topical Primary Dressing Promogran Prisma Matrix, 4.34 (sq in) (silver collagen) Discharge Instruction: Moisten collagen with saline or hydrogel Secondary Dressing Woven Gauze Sponge, Non-Sterile 4x4 in Discharge Instruction: Apply over primary dressing as directed. Drawtex 4x4 in Discharge Instruction: Back prisma with the drawtex. Apply over primary dressing as directed. Secured With Compression Wrap FourPress (4 layer compression wrap) Discharge Instruction: Apply four layer compression as directed. APPLY UNNA BOOT FIRST LAYER TO UPPER PORTION OF LOWER LEG. Compression Stockings Add-Ons Electronic Signature(s) Signed: 05/21/2020 2:31:34 PM By: Baruch Gouty RN, [name] Signed: 05/22/2020 11:46:23 AM By: Sandre Kitty Entered By: Sandre Kitty on 05/16/2020 12:56:11 -------------------------------------------------------------------------------- Wound Assessment Details Patient Name: Date of Service: Guarnieri, [name] 05/16/2020 12:30 PM Medical Record Number: [id number] Patient Account Number: [id number] Date of Birth/Sex: Treating RN: 1945/07/12 (75 y.o. [name] Primary Care Aiva Miskell: [name] Other Clinician: Referring Florenda Watt: Treating Seanpaul Preece/Extender: [name] Weeks in Treatment: 11 Wound Status Wound Number: 2 Primary Etiology: Lymphedema Wound Location: Left, Lateral Lower Leg Wound Status: Open Wounding Event: Blister Date Acquired: 01/15/2020 Weeks Of Treatment: 11 Clustered Wound: No Wound Measurements Length: (cm) 0.3 Width: (cm) 0.2 Depth: (cm) 0.4 Area: (cm) 0.047 Volume: (cm) 0.019 % Reduction in Area: 98.6% % Reduction in Volume: 99.6% Wound Description Classification: Full Thickness Without Exposed Support Structur es Treatment Notes Wound #2 (Lower Leg) Wound Laterality: Left, Lateral Cleanser Peri-Wound Care Triamcinolone 15 (g) Discharge  Instruction: Use triamcinolone 15 (g) as directed. MIXED WITH LOTION. Sween Lotion (Moisturizing lotion) Discharge Instruction: Apply moisturizing lotion as directed Topical Primary Dressing Promogran Prisma Matrix, 4.34 (sq in) (silver collagen) Discharge Instruction: Moisten collagen with saline or hydrogel Secondary Dressing Woven Gauze Sponge, Non-Sterile 4x4 in Discharge Instruction: Apply over primary dressing as directed. Secured With Compression Wrap FourPress (4 layer compression wrap) Discharge Instruction: Apply four layer compression as directed. APPLY UNNA BOOT FIRST LAYER TO UPPER PORTION OF LOWER LEG. Compression Stockings Add-Ons Electronic Signature(s) Signed: 05/21/2020 2:31:34 PM By: Baruch Gouty RN, [name] Signed: 05/22/2020 11:46:23 AM By: Sandre Kitty Entered By: Sandre Kitty on 05/16/2020 12:56:11 -------------------------------------------------------------------------------- Vitals Details Patient Name: Date of Service: Yan, [name] 05/16/2020 12:30 PM Medical Record Number: [id number] Patient Account Number: [id number] Date of Birth/Sex: Treating RN: [birth year] (75 y.o. [name] Primary Care Jovahn Breit: [name] Other Clinician: Referring Suzy Kugel: Treating Natale Barba/Extender: [name] Weeks in Treatment: 11 Vital Signs Time Taken: 12:55 Temperature (F): 97.8 Pulse (bpm): 80 Respiratory Rate (breaths/min): 20 Blood Pressure (mmHg): 160/64 Reference Range: 80 - 120 mg / dl Electronic Signature(s) Signed: 05/22/2020 11:46:23 AM By: Sandre Kitty Entered By: Sandre Kitty on 05/16/2020 12:55:55

## 2020-05-22 NOTE — Progress Notes (Signed)
Leite, TAMARA ([id number]) Visit Report for 05/16/2020 SuperBill Details Patient Name: Date of Service: Kroeger, [name] 05/16/2020 Medical Record Number: [id number] Patient Account Number: [id number] Date of Birth/Sex: Treating RN: 1945/11/11 (75 y.o. Damaris Schooner Primary Care Provider: [name] Other Clinician: Referring Provider: Treating Provider/Extender: [name] Weeks in Treatment: 11 Diagnosis Coding ICD-10 Codes Code Description I89.0 Lymphedema, not elsewhere classified L97.822 Non-pressure chronic ulcer of other part of left lower leg with fat layer exposed L97.812 Non-pressure chronic ulcer of other part of right lower leg with fat layer exposed Facility Procedures CPT4 Description Modifier Quantity Code [id number] BILATERAL: Application of multi-layer venous compression system; leg (below knee), including ankle and 1 foot. Electronic Signature(s) Signed: 05/16/2020 1:46:21 PM By: Benjaman Kindler EMT/HBOT/SD Signed: 05/22/2020 4:34:46 PM By: [name] PA-C Entered By: Benjaman Kindler on 05/16/2020 13:46:00

## 2020-05-24 ENCOUNTER — Encounter (HOSPITAL_BASED_OUTPATIENT_CLINIC_OR_DEPARTMENT_OTHER): Payer: Medicare Other | Attending: Internal Medicine | Admitting: Internal Medicine

## 2020-05-24 ENCOUNTER — Other Ambulatory Visit: Payer: Self-pay

## 2020-05-24 DIAGNOSIS — I890 Lymphedema, not elsewhere classified: Secondary | ICD-10-CM | POA: Insufficient documentation

## 2020-05-24 DIAGNOSIS — I87331 Chronic venous hypertension (idiopathic) with ulcer and inflammation of right lower extremity: Secondary | ICD-10-CM | POA: Insufficient documentation

## 2020-05-24 DIAGNOSIS — L97812 Non-pressure chronic ulcer of other part of right lower leg with fat layer exposed: Secondary | ICD-10-CM | POA: Insufficient documentation

## 2020-05-24 NOTE — Progress Notes (Signed)
[name], [name] ([id number]) Visit Report for 05/24/2020 HPI Details Patient Name: Date of Service: [name] 05/24/2020 1:30 PM Medical Record Number: [id number] Patient Account Number: [id number] Date of Birth/Sex: Treating RN: 03-22-1946 (75 y.o. [name] Primary Care Provider: Ricki [name] Other Clinician: Referring Provider: Treating Provider/Extender: [name] Weeks in Treatment: 13 History of Present Illness HPI Description: ADMISSION 02/23/2020 Patient is a 75 year old man who lives in [place] who arrives accompanied by his wife. He has a history of chronic lymphedema and venous insufficiency in his bilateral lower legs which may have something to do that with having a history of DVT as well as being treated for prostate cancer. In any case he recently got compression pumps at home but compliance has been an issue here. He has compression stockings however they are probably not sufficient enough to control swelling. They tell us that things deteriorated for him in late August he was admitted to [facility] for 7 days. This was with cellulitis I think of his bilateral lower legs. Discharge he was noted to have wounds on his bilateral lower legs. He was discharged on Bactrim. They tried to get him home health through [facility] of course they declined him. His wife is been wrapping these applying some form of silver foam dressing. He has a history of wounds before although nothing that would not heal with basic home topical dressings. He has 2 areas on the left medial, left anterior and left lateral and a smaller area on the right medial. All of these have considerable depth. Past medical history includes iron deficiency anemia, lymphedema followed by the rehab center at [facility] with lymphedema wraps I believe, DVT on chronic anticoagulation, prostate cancer, chronic venous insufficiency, hypertension. As mentioned he has  compression pumps but does not use them. ABIs in our clinic were noncompressible bilaterally 10/14; patient with severe bilateral lymphedema right greater than left. He came in with bilateral lower extremity wounds left greater than right. Even though the right side has more of the edema most of the wounds here almost closed on the right medial. He has 3 remaining wounds on the left We have been using silver alginate under 4-layer compression I have been trying to get him to be compliant with his external compression pumps 10/21; patient with 3 small wounds on the left leg and 1 on the right medial in the setting of severe lymphedema and chronic venous insufficiency. We have been using silver alginate under 4-layer compression he is using his external compression pumps twice a day 11/4; ARTERIAL STUDIES on the right show an ABI of 1.02 TBI of 0.858 with biphasic waveforms on the left 0.98 with a TBI of 0.55 and biphasic waveforms. Does not look like he has significant arterial disease. We are treating him for lymphedema he has compression pumps. He has punched-out areas on the left anterior left lateral and right medial lower extremities 11/11; after we obtained his arterial studies I put him in 4 layer compression. He is using his compression pumps probably once a day although I have asked him to do twice. Primary dressing to the wound is silver collagen he has severe lymphedema likely secondary to chronic venous insufficiency. Wounds on the left lateral, left medial and left anterior and a small area on the right medial 12/2; the area on the right anterior lower leg has healed. We initially thought that the area medially had healed as well however when  her discharge nurse came in she detected fluid in the wound simply opened up. This is actually worse than I remember this pain. The area on the left lateral potentially slightly smaller He is also complaining about pain in his left hand he says that  this is actually been getting some better he has been using topical creams on this. She asked that I look at this 12/9 after last weeks issues we have 2 wounds one on the right medial lower leg and 1 on the left lateral. Both of these are in the same condition. I think because of thickened skin secondary to chronic lymphedema these wounds actually have depth of almost 0.8 cm. 12/16; the patient has 2 small but deep wounds one on the right medial and one on the left lateral. The right medial is actually the worst of these. He arrives in clinic today with absolutely terrible edema in the right leg apparently his 4-layer wrap fell down to just above his ankle he did not think about this he is apparently been continuing to use his compression pump twice a day. The left leg looks a lot better. 05/09/2020 upon evaluation today patient appears to be doing decently well in regard to his wounds. Everything is measuring smaller the right leg still has a little bit deeper wound in the left seems to be almost completely healed in my opinion I am very pleased in general with how things are progressing. He has a 4- layer compression wrap we have been using endoform today we will probably have to use collagen just based on the fact that we do not have endoform it is on order. 1/6; the patient's wound on the left lateral lower leg has healed. Still has 1 on the right medial. 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 Using compression pumps at home twice a day. Electronic Signature(s) Signed: 05/24/2020 5:18:36 PM By: [name] Ham MD Entered By: [name] on 05/24/2020 15:23:32 -------------------------------------------------------------------------------- Physical Exam Details Patient Name: Date of Service: [name], [name] 05/24/2020 1:30 PM Medical Record Number: [id number] Patient Account Number: [id number] Date of Birth/Sex: Treating RN: 1946-05-08 (75 y.o. [name] Primary  Care Provider: Birdie [name] Other Clinician: Referring Provider: Treating Provider/Extender: [name] Weeks in Treatment: 74 Constitutional Patient is hypertensive.. Pulse regular and within target range for patient.Marland Kitchen Respirations regular, non-labored and within target range.. Temperature is normal and within the target range for the patient.Marland Kitchen Appears in no distress. Cardiovascular Edema present in both extremities. Nonpitting. Notes Wound exam; the patient's area on the left lateral lower leg is just about closed. 0.1 x 0.1 x 0.1. Still a vulnerable area present. On the right is smaller but still with some depth. Is very thick skin from the lymphedema. There is no evidence of infection. Swelling on the right is quite a bit more than the left Electronic Signature(s) Signed: 05/24/2020 5:18:36 PM By: [name] Ham MD Entered By: [name] on 05/24/2020 15:26:56 -------------------------------------------------------------------------------- Physician Orders Details Patient Name: Date of Service: [name], [name] 05/24/2020 1:30 PM Medical Record Number: [id number] Patient Account Number: [id number] Date of Birth/Sex: Treating RN: 1946/04/04 (75 y.o. [name] Primary Care Provider: Birdie [name] Other Clinician: Referring Provider: Treating Provider/Extender: [name] Weeks in Treatment: 22 Verbal / Phone Orders: No Diagnosis Coding ICD-10 Coding Code Description I89.0 Lymphedema, not elsewhere classified L97.822 Non-pressure chronic ulcer of other part of left lower leg with fat layer exposed L97.812 Non-pressure  chronic ulcer of other part of right lower leg with fat layer exposed Follow-up Appointments Return Appointment in 1 week. Bathing/ Shower/ Hygiene May shower with protection but do not get wound dressing(s) wet. - use a cast protector. Edema Control - Lymphedema / SCD / Other Bilateral Lower  Extremities Lymphedema Pumps. Use Lymphedema pumps on leg(s) 2-3 times a day for 45-60 minutes. If wearing any wraps or hose, do not remove them. Continue exercising as instructed. - 3 times a day. Elevate legs to the level of the heart or above for 30 minutes daily and/or when sitting, a frequency of: - 3-4 times Avoid standing for long periods of time. Patient to wear own compression stockings every day. - ***Patient to apply current compression stocking to left leg in the morning and remove at night. lotion daily. Exercise regularly Compression stocking or Garment 30-40 mm/Hg pressure to: - Wound Center to order Extremit-ease for bilateral lower legs. Patient to bring in next week. Wound Treatment Wound #1 - Lower Leg Wound Laterality: Right, Medial Peri-Wound Care: Triamcinolone 15 (g) Other:CHANGE WEEKLY AT WOUND CENTER./7 Days Discharge Instructions: Use triamcinolone 15 (g) as directed. MIXED WITH LOTION. Peri-Wound Care: Sween Lotion (Moisturizing lotion) Other:CHANGE WEEKLY AT WOUND CENTER./7 Days Discharge Instructions: Apply moisturizing lotion as directed Prim Dressing: Promogran Prisma Matrix, 4.34 (sq in) (silver collagen) Other:CHANGE WEEKLY AT WOUND CENTER./7 Days ary Discharge Instructions: Moisten collagen with saline or hydrogel Secondary Dressing: Woven Gauze Sponge, Non-Sterile 4x4 in Other:CHANGE WEEKLY AT WOUND CENTER./7 Days Discharge Instructions: Apply over primary dressing as directed. Secondary Dressing: Drawtex 4x4 in [place] [facility]./7 Days Discharge Instructions: Back prisma with the drawtex. Apply over primary dressing as directed. Compression Wrap: FourPress (4 layer compression wrap) Other:CHANGE WEEKLY AT WOUND CENTER./7 Days Discharge Instructions: Apply four layer compression as directed. APPLY UNNA BOOT FIRST LAYER TO UPPER PORTION OF LOWER LEG. Wound #2 - Lower Leg Wound Laterality: Left, Lateral Peri-Wound Care: Triamcinolone 15  (g) 1 x Per Week/30 Days Discharge Instructions: Use triamcinolone 15 (g) as directed. MIXED WITH LOTION. Peri-Wound Care: Sween Lotion (Moisturizing lotion) 1 x Per Week/30 Days Discharge Instructions: Apply moisturizing lotion as directed Secondary Dressing: ComfortFoam Border, 3x3 in (silicone border) 1 x Per Week/30 Days Discharge Instructions: Apply over primary dressing as directed. Electronic Signature(s) Signed: 05/24/2020 5:18:36 PM By: [name] Ham MD Signed: 05/24/2020 6:28:52 PM By: Deon Pilling Entered By: Deon Pilling on 05/24/2020 15:29:02 -------------------------------------------------------------------------------- Problem List Details Patient Name: Date of Service: [name], [name] 05/24/2020 1:30 PM Medical Record Number: [id number] Patient Account Number: [id number] Date of Birth/Sex: Treating RN: 27-Aug-1945 (75 y.o. [name], [name] Primary Care Provider: Birdie [name] Other Clinician: Referring Provider: Treating Provider/Extender: [name] Weeks in Treatment: 13 Active Problems ICD-10 Encounter Code Description Active Date MDM Diagnosis I89.0 Lymphedema, not elsewhere classified 02/23/2020 No Yes L97.822 Non-pressure chronic ulcer of other part of left lower leg with fat layer exposed10/11/2019 No Yes L97.812 Non-pressure chronic ulcer of other part of right lower leg with fat layer 02/23/2020 No Yes exposed Inactive Problems Resolved Problems Electronic Signature(s) Signed: 05/24/2020 5:18:36 PM By: [name] Ham MD Entered By: [name] on 05/24/2020 15:19:32 -------------------------------------------------------------------------------- Progress Note Details Patient Name: Date of Service: [name], [name] 05/24/2020 1:30 PM Medical Record Number: [id number] Patient Account Number: [id number] Date of Birth/Sex: Treating RN: 1945-09-06 (75 y.o. [name] Primary Care Provider: Birdie [name] Other  Clinician: Referring Provider: Treating Provider/Extender: [name] Weeks in Treatment:  13 Subjective History of Present Illness (HPI) ADMISSION 02/23/2020 Patient is a 76 year old man who lives in [place] who arrives accompanied by his wife. He has a history of chronic lymphedema and venous insufficiency in his bilateral lower legs which may have something to do that with having a history of DVT as well as being treated for prostate cancer. In any case he recently got compression pumps at home but compliance has been an issue here. He has compression stockings however they are probably not sufficient enough to control swelling. They tell us that things deteriorated for him in late August he was admitted to [facility] for 7 days. This was with cellulitis I think of his bilateral lower legs. Discharge he was noted to have wounds on his bilateral lower legs. He was discharged on Bactrim. They tried to get him home health through [facility] of course they declined him. His wife is been wrapping these applying some form of silver foam dressing. He has a history of wounds before although nothing that would not heal with basic home topical dressings. He has 2 areas on the left medial, left anterior and left lateral and a smaller area on the right medial. All of these have considerable depth. Past medical history includes iron deficiency anemia, lymphedema followed by the rehab center at [facility] with lymphedema wraps I believe, DVT on chronic anticoagulation, prostate cancer, chronic venous insufficiency, hypertension. As mentioned he has compression pumps but does not use them. ABIs in our clinic were noncompressible bilaterally 10/14; patient with severe bilateral lymphedema right greater than left. He came in with bilateral lower extremity wounds left greater than right. Even though the right side has more of the edema most of the wounds here almost  closed on the right medial. He has 3 remaining wounds on the left We have been using silver alginate under 4-layer compression I have been trying to get him to be compliant with his external compression pumps 10/21; patient with 3 small wounds on the left leg and 1 on the right medial in the setting of severe lymphedema and chronic venous insufficiency. We have been using silver alginate under 4-layer compression he is using his external compression pumps twice a day 11/4; ARTERIAL STUDIES on the right show an ABI of 1.02 TBI of 0.858 with biphasic waveforms on the left 0.98 with a TBI of 0.55 and biphasic waveforms. Does not look like he has significant arterial disease. We are treating him for lymphedema he has compression pumps. He has punched-out areas on the left anterior left lateral and right medial lower extremities 11/11; after we obtained his arterial studies I put him in 4 layer compression. He is using his compression pumps probably once a day although I have asked him to do twice. Primary dressing to the wound is silver collagen he has severe lymphedema likely secondary to chronic venous insufficiency. Wounds on the left lateral, left medial and left anterior and a small area on the right medial 12/2; the area on the right anterior lower leg has healed. We initially thought that the area medially had healed as well however when her discharge nurse came in she detected fluid in the wound simply opened up. This is actually worse than I remember this pain. The area on the left lateral potentially slightly smaller He is also complaining about pain in his left hand he says that this is actually been getting some better he has been using topical creams on  this. She asked that I look at this 12/9 after last weeks issues we have 2 wounds one on the right medial lower leg and 1 on the left lateral. Both of these are in the same condition. I think because of thickened skin secondary to chronic  lymphedema these wounds actually have depth of almost 0.8 cm. 12/16; the patient has 2 small but deep wounds one on the right medial and one on the left lateral. The right medial is actually the worst of these. He arrives in clinic today with absolutely terrible edema in the right leg apparently his 4-layer wrap fell down to just above his ankle he did not think about this he is apparently been continuing to use his compression pump twice a day. The left leg looks a lot better. 05/09/2020 upon evaluation today patient appears to be doing decently well in regard to his wounds. Everything is measuring smaller the right leg still has a little bit deeper wound in the left seems to be almost completely healed in my opinion I am very pleased in general with how things are progressing. He has a 4- layer compression wrap we have been using endoform today we will probably have to use collagen just based on the fact that we do not have endoform it is on order. 1/6; the patient's wound on the left lateral lower leg has healed. Still has 1 on the right medial. 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 Using compression pumps at home twice a day. Objective Constitutional Patient is hypertensive.. Pulse regular and within target range for patient.Marland Kitchen Respirations regular, non-labored and within target range.. Temperature is normal and within the target range for the patient.Marland Kitchen Appears in no distress. Vitals Time Taken: 2:31 PM, Temperature: 97.6 F, Pulse: 105 bpm, Respiratory Rate: 20 breaths/min, Blood Pressure: 192/73 mmHg. Cardiovascular Edema present in both extremities. Nonpitting. General Notes: Wound exam; the patient's area on the left lateral lower leg is closed. On the right is smaller but still with some depth. Is very thick skin from the lymphedema. There is no evidence of infection. Swelling on the right is quite a bit more than the left Integumentary (Hair, Skin) Wound #1 status  is Open. Original cause of wound was Blister. The wound is located on the Right,Medial Lower Leg. The wound measures 0.4cm length x 1.5cm width x 0.7cm depth; 0.471cm^2 area and 0.33cm^3 volume. There is Fat Layer (Subcutaneous Tissue) exposed. There is no tunneling or undermining noted. There is a medium amount of serosanguineous drainage noted. The wound margin is distinct with the outline attached to the wound base. There is large (67-100%) pink, pale granulation within the wound bed. There is no necrotic tissue within the wound bed. Wound #2 status is Open. Original cause of wound was Blister. The wound is located on the Left,Lateral Lower Leg. The wound measures 0.1cm length x 0.1cm width x 0.1cm depth; 0cm^2 area and 0cm^3 volume. There is no tunneling or undermining noted. There is a none present amount of drainage noted. The wound margin is indistinct and nonvisible. There is large (67-100%) red granulation within the wound bed. There is no necrotic tissue within the wound bed. Assessment Active Problems ICD-10 Lymphedema, not elsewhere classified Non-pressure chronic ulcer of other part of left lower leg with fat layer exposed Non-pressure chronic ulcer of other part of right lower leg with fat layer exposed Procedures Wound #1 Pre-procedure diagnosis of Wound #1 is a Lymphedema located on the Right,Medial Lower Leg .  There was a Four Layer Compression Therapy Procedure by Baruch Gouty, RN. Post procedure Diagnosis Wound #1: Same as Pre-Procedure Plan 1. I am still continue with silver collagen with 4-layer compression on the right he continues to use his compression pumps twice a day 2. I am transitioning him into his own stocking on the left leg still using the compression pumps twice a day. 3. In terms of ongoing stocking use I think he would be best served by using extremitease stockings. There is only a small open area remaining on the left which should heal out by the next  time he is here Electronic Signature(s) Signed: 05/24/2020 5:18:36 PM By: [name] Ham MD Entered By: [name] on 05/24/2020 15:28:19 -------------------------------------------------------------------------------- SuperBill Details Patient Name: Date of Service: [name], [name] 05/24/2020 Medical Record Number: [id number] Patient Account Number: [id number] Date of Birth/Sex: Treating RN: [birth year] (75 y.o. [name] Primary Care Provider: Birdie [name] Other Clinician: Referring Provider: Treating Provider/Extender: [name] Weeks in Treatment: 13 Diagnosis Coding ICD-10 Codes Code Description I89.0 Lymphedema, not elsewhere classified L97.822 Non-pressure chronic ulcer of other part of left lower leg with fat layer exposed L97.812 Non-pressure chronic ulcer of other part of right lower leg with fat layer exposed Facility Procedures CPT4 Code: [id number] Description: (Facility Use Only) [phone] - [facility] LWR RT LEG Modifier: Quantity: 1 Physician Procedures : CPT4 Code Description Modifier [id number] - WC PHYS LEVEL 3 - EST PT ICD-10 Diagnosis Description I89.0 Lymphedema, not elsewhere classified L97.822 Non-pressure chronic ulcer of other part of left lower leg with fat layer exposed L97.812  Non-pressure chronic ulcer of other part of right lower leg with fat layer exposed Quantity: 1 Electronic Signature(s) Signed: 05/24/2020 5:18:36 PM By: [name] Ham MD Signed: 05/24/2020 6:28:52 PM By: Deon Pilling Entered By: Deon Pilling on 05/24/2020 16:57:39

## 2020-05-25 NOTE — Progress Notes (Addendum)
[place], RESHARD ([id number]) Visit Report for 05/24/2020 Arrival Information Details Patient Name: Date of Service: Odonovan, [name] 05/24/2020 1:30 PM Medical Record Number: [id number] Patient Account Number: [id number] Date of Birth/Sex: Treating RN: 30-Apr-1946 (75 y.o. [name] Primary Care Berklie Dethlefs: [name] Other Clinician: Referring Emmerson Taddei: Treating Mykai Wendorf/Extender: Alycia Rossetti in Treatment: 22 Visit Information History Since Last Visit Added or deleted any medications: No Patient Arrived: Ambulatory Any new allergies or adverse reactions: No Arrival Time: 14:31 Had a fall or experienced change in No Accompanied By: self activities of daily living that may affect Transfer Assistance: None risk of falls: Patient Identification Verified: Yes Signs or symptoms of abuse/neglect since last visito No Secondary Verification Process Completed: Yes Hospitalized since last visit: No Patient Requires Transmission-Based Precautions: No Implantable device outside of the clinic excluding No Patient Has Alerts: Yes cellular tissue based products placed in the center Patient Alerts: Patient on Blood Thinner since last visit: on warfarin Has Dressing in Place as Prescribed: Yes Bilateral ABI: [place] Pain Present Now: No Electronic Signature(s) Signed: 05/25/2020 11:05:27 AM By: Sandre Kitty Entered By: Sandre Kitty on 05/24/2020 14:31:27 -------------------------------------------------------------------------------- Compression Therapy Details Patient Name: Date of Service: Jue, [name] 05/24/2020 1:30 PM Medical Record Number: [id number] Patient Account Number: [id number] Date of Birth/Sex: Treating RN: 06-14-1945 (74 y.o. [name] Primary Care Ehab Humber: [name] Other Clinician: Referring Nicolemarie Wooley: Treating Donesha Wallander/Extender: Drucilla Schmidt Weeks in Treatment: 13 Compression Therapy Performed for Wound  Assessment: Wound #1 Right,Medial Lower Leg Performed By: Clinician Baruch Gouty, RN Compression Type: Four Layer Post Procedure Diagnosis Same as Pre-procedure Electronic Signature(s) Signed: 05/24/2020 6:28:52 PM By: Deon Pilling Entered By: Deon Pilling on 05/24/2020 15:22:15 -------------------------------------------------------------------------------- Encounter Discharge Information Details Patient Name: Date of Service: Mateja, [name] 05/24/2020 1:30 PM Medical Record Number: [id number] Patient Account Number: [id number] Date of Birth/Sex: Treating RN: 1946/05/01 (75 y.o. [name] Primary Care Frederico Gerling: [name] Other Clinician: Referring Mylinh Cragg: Treating Akeela Busk/Extender: Alycia Rossetti in Treatment: 13 Encounter Discharge Information Items Discharge Condition: Stable Ambulatory Status: Ambulatory Discharge Destination: Home Transportation: Private Auto Accompanied By: self Schedule Follow-up Appointment: Yes Clinical Summary of Care: Patient Declined Electronic Signature(s) Signed: 05/24/2020 6:02:34 PM By: Baruch Gouty RN, [name] Entered By: Baruch Gouty on 05/24/2020 15:49:26 -------------------------------------------------------------------------------- Lower Extremity Assessment Details Patient Name: Date of Service: Fouse, [name] 05/24/2020 1:30 PM Medical Record Number: [id number] Patient Account Number: [id number] Date of Birth/Sex: Treating RN: [birth year] (74 y.o. Ulyses Amor, Vaughan Basta Primary Care Avin Gibbons: [name] Other Clinician: Referring Lareta Bruneau: Treating Waldon Sheerin/Extender: Drucilla Schmidt Weeks in Treatment: 13 Edema Assessment Assessed: [name]: No] [name]: No] Edema: [Left: Yes] [Right: Yes] Calf Left: Right: Point of Measurement: 46 cm From Medial Instep 56.4 cm 53 cm Ankle Left: Right: Point of Measurement: 15 cm From Medial Instep 35 cm 35.5 cm Knee To Floor Left:  Right: From Medial Instep 53 cm 51 cm Vascular Assessment Pulses: Dorsalis Pedis Palpable: [Left:Yes] [Right:Yes] Electronic Signature(s) Signed: 05/24/2020 6:02:34 PM By: Baruch Gouty RN, [name] Signed: 05/24/2020 6:28:52 PM By: Deon Pilling Entered By: Deon Pilling on 05/24/2020 15:23:04 -------------------------------------------------------------------------------- [place] Details Patient Name: Date of Service: Hagerty, [name] 05/24/2020 1:30 PM Medical Record Number: [id number] Patient Account Number: [id number] Date of Birth/Sex: Treating RN: 1945-09-07 (75 y.o. [name] Primary Care Suzane Vanderweide: [name] Other Clinician: Referring Treazure Nery: Treating Jaja Switalski/Extender: Drucilla Schmidt Weeks in Treatment:  13 Active Inactive Pain, Acute or Chronic Nursing Diagnoses: Pain, acute or chronic: actual or potential Potential alteration in comfort, pain Goals: Patient will verbalize adequate pain control and receive pain control interventions during procedures as needed Date Initiated: 02/23/2020 Target Resolution Date: 06/14/2020 Goal Status: Active Patient/caregiver will verbalize comfort level met Date Initiated: 02/23/2020 Date Inactivated: 03/22/2020 Target Resolution Date: 03/23/2020 Goal Status: Met Interventions: Provide education on pain management Reposition patient for comfort Notes: Wound/Skin Impairment Nursing Diagnoses: Knowledge deficit related to ulceration/compromised skin integrity Goals: Ulcer/skin breakdown will heal within 14 weeks Date Initiated: 02/23/2020 Target Resolution Date: 06/15/2020 Goal Status: Active Interventions: Assess patient/caregiver ability to obtain necessary supplies Assess patient/caregiver ability to perform ulcer/skin care regimen upon admission and as needed Provide education on ulcer and skin care Treatment Activities: Skin care regimen initiated : 02/23/2020 Topical wound  management initiated : 02/23/2020 Notes: Electronic Signature(s) Signed: 05/24/2020 6:28:52 PM By: Deon Pilling Entered By: Deon Pilling on 05/24/2020 15:04:03 -------------------------------------------------------------------------------- Pain Assessment Details Patient Name: Date of Service: Kisiel, Yreka 05/24/2020 1:30 PM Medical Record Number: [id number] Patient Account Number: [id number] Date of Birth/Sex: Treating RN: 1945-07-26 (75 y.o. [name] Primary Care Eartha Vonbehren: Other Clinician: [name] Referring Mavis Gravelle: Treating Magdelene Ruark/Extender: Drucilla Schmidt Weeks in Treatment: 13 Active Problems Location of Pain Severity and Description of Pain Patient Has Paino No Site Locations Pain Management and Medication Current Pain Management: Electronic Signature(s) Signed: 05/24/2020 6:28:52 PM By: Deon Pilling Signed: 05/25/2020 11:05:27 AM By: Sandre Kitty Entered By: Sandre Kitty on 05/24/2020 14:31:50 -------------------------------------------------------------------------------- Patient/Caregiver Education Details Patient Name: Date of Service: Seivert, [name] 1/6/2022andnbsp1:30 PM Medical Record Number: [id number] Patient Account Number: [id number] Date of Birth/Gender: Treating RN: [birth year] (75 y.o. [name] Primary Care Physician: [name] Other Clinician: Referring Physician: Treating Physician/Extender: Alycia Rossetti in Treatment: 13 Education Assessment Education Provided To: Patient Education Topics Provided Wound/Skin Impairment: Handouts: Skin Care Do's and Dont's Methods: Explain/Verbal Responses: Reinforcements needed Electronic Signature(s) Signed: 05/24/2020 6:28:52 PM By: Deon Pilling Entered By: Deon Pilling on 05/24/2020 15:04:15 -------------------------------------------------------------------------------- Wound Assessment Details Patient Name: Date of  Service: [place], [name] 05/24/2020 1:30 PM Medical Record Number: [id number] Patient Account Number: [id number] Date of Birth/Sex: Treating RN: Jul 03, 1945 (75 y.o. [name] Primary Care Chesley Valls: [name] Other Clinician: Referring Webster Patrone: Treating Jaye Polidori/Extender: Drucilla Schmidt Weeks in Treatment: 13 Wound Status Wound Number: 1 Primary Lymphedema Etiology: Wound Location: Right, Medial Lower Leg Wound Status: Open Wounding Event: Blister Comorbid Lymphedema, Deep Vein Thrombosis, Hypertension, Date Acquired: 01/15/2020 History: Received Radiation Weeks Of Treatment: 13 Clustered Wound: No Photos Wound Measurements Length: (cm) 0.4 Width: (cm) 1.5 Depth: (cm) 0.7 Area: (cm) 0.471 Volume: (cm) 0.33 % Reduction in Area: 40% % Reduction in Volume: -317.7% Epithelialization: None Tunneling: No Undermining: No Wound Description Classification: Full Thickness Without Exposed Support Structures Wound Margin: Distinct, outline attached Exudate Amount: Medium Exudate Type: Serosanguineous Exudate Color: red, brown Foul Odor After Cleansing: No Slough/Fibrino No Wound Bed Granulation Amount: Large (67-100%) Exposed Structure Granulation Quality: Pink, Pale Fascia Exposed: No Necrotic Amount: None Present (0%) Fat Layer (Subcutaneous Tissue) Exposed: Yes Tendon Exposed: No Muscle Exposed: No Joint Exposed: No Bone Exposed: No Treatment Notes Wound #1 (Lower Leg) Wound Laterality: Right, Medial Cleanser Peri-Wound Care Triamcinolone 15 (g) Discharge Instruction: Use triamcinolone 15 (g) as directed. MIXED WITH LOTION. Sween Lotion (Moisturizing lotion) Discharge Instruction: Apply moisturizing lotion as directed Topical Primary Dressing Promogran Prisma  Matrix, 4.34 (sq in) (silver collagen) Discharge Instruction: Moisten collagen with saline or hydrogel Secondary Dressing Woven Gauze Sponge, Non-Sterile 4x4 in Discharge  Instruction: Apply over primary dressing as directed. Drawtex 4x4 in Discharge Instruction: Back prisma with the drawtex. Apply over primary dressing as directed. Secured With Compression Wrap FourPress (4 layer compression wrap) Discharge Instruction: Apply four layer compression as directed. APPLY UNNA BOOT FIRST LAYER TO UPPER PORTION OF LOWER LEG. Compression Stockings Add-Ons Electronic Signature(s) Signed: 05/29/2020 4:33:09 PM By: Mikeal Hawthorne EMT/HBOT/SD Signed: 05/29/2020 6:04:09 PM By: Baruch Gouty RN, [name] Previous Signature: 05/24/2020 6:02:34 PM Version By: Baruch Gouty RN, [name] Entered By: Mikeal Hawthorne on 05/29/2020 16:06:21 -------------------------------------------------------------------------------- Wound Assessment Details Patient Name: Date of Service: Sarris, [name] 05/24/2020 1:30 PM Medical Record Number: [id number] Patient Account Number: [id number] Date of Birth/Sex: Treating RN: Feb 05, 1946 (75 y.o. [name], [name] Primary Care Keyon Liller: [name] Other Clinician: Referring Jesi Jurgens: Treating Azim Gillingham/Extender: Drucilla Schmidt Weeks in Treatment: 13 Wound Status Wound Number: 2 Primary Lymphedema Etiology: Wound Location: Left, Lateral Lower Leg Wound Status: Open Wounding Event: Blister Comorbid Lymphedema, Deep Vein Thrombosis, Hypertension, Date Acquired: 01/15/2020 History: Received Radiation Weeks Of Treatment: 13 Clustered Wound: No Photos Wound Measurements Length: (cm) 0.1 Width: (cm) 0.1 Depth: (cm) 0.1 Area: (cm) 0 Volume: (cm) 0 % Reduction in Area: 100% % Reduction in Volume: 100% Epithelialization: Large (67-100%) Tunneling: No Undermining: No Wound Description Classification: Full Thickness Without Exposed Support Structures Wound Margin: Indistinct, nonvisible Exudate Amount: None Present Foul Odor After Cleansing: No Slough/Fibrino No Wound Bed Granulation Amount: Large (67-100%) Exposed  Structure Granulation Quality: Red Fascia Exposed: No Necrotic Amount: None Present (0%) Fat Layer (Subcutaneous Tissue) Exposed: No Tendon Exposed: No Muscle Exposed: No Joint Exposed: No Bone Exposed: No Treatment Notes Wound #2 (Lower Leg) Wound Laterality: Left, Lateral Cleanser Peri-Wound Care Triamcinolone 15 (g) Discharge Instruction: Use triamcinolone 15 (g) as directed. MIXED WITH LOTION. Sween Lotion (Moisturizing lotion) Discharge Instruction: Apply moisturizing lotion as directed Topical Primary Dressing Secondary Dressing ComfortFoam Border, 3x3 in (silicone border) Discharge Instruction: Apply over primary dressing as directed. Secured With Compression Wrap Compression Stockings Add-Ons Electronic Signature(s) Signed: 05/29/2020 4:33:09 PM By: Mikeal Hawthorne EMT/HBOT/SD Signed: 05/30/2020 4:35:23 PM By: Deon Pilling Previous Signature: 05/24/2020 6:28:52 PM Version By: Deon Pilling Entered By: Mikeal Hawthorne on 05/29/2020 16:06:43 -------------------------------------------------------------------------------- Vitals Details Patient Name: Date of Service: Wirt, [name] 05/24/2020 1:30 PM Medical Record Number: [id number] Patient Account Number: [id number] Date of Birth/Sex: Treating RN: 01/19/1946 (75 y.o. [name] Primary Care Dervin Vore: [name] Other Clinician: Referring Makylee Sanborn: Treating Paolo Okane/Extender: Drucilla Schmidt Weeks in Treatment: 13 Vital Signs Time Taken: 14:31 Temperature (F): 97.6 Pulse (bpm): 105 Respiratory Rate (breaths/min): 20 Blood Pressure (mmHg): 192/73 Reference Range: 80 - 120 mg / dl Electronic Signature(s) Signed: 05/25/2020 11:05:27 AM By: Sandre Kitty Entered By: Sandre Kitty on 05/24/2020 14:31:45

## 2020-05-31 ENCOUNTER — Other Ambulatory Visit: Payer: Self-pay

## 2020-05-31 ENCOUNTER — Encounter (HOSPITAL_BASED_OUTPATIENT_CLINIC_OR_DEPARTMENT_OTHER): Payer: Medicare Other | Admitting: Internal Medicine

## 2020-05-31 DIAGNOSIS — I87331 Chronic venous hypertension (idiopathic) with ulcer and inflammation of right lower extremity: Secondary | ICD-10-CM | POA: Diagnosis not present

## 2020-05-31 NOTE — Progress Notes (Signed)
[place], [place] ([id number]) Visit Report for 05/31/2020 Arrival Information Details Patient Name: Date of Service: [name] 05/31/2020 2:45 PM Medical Record Number: [id number] Patient Account Number: [id number] Date of Birth/Sex: Treating RN: [birth year] (75 y.o. Janyth Contes Primary Care Zayda Angell: [name] Other Clinician: Referring Zhavia Cunanan: Treating [employer]/Extender: Drucilla Schmidt Weeks in Treatment: 14 Visit Information History Since Last Visit Added or deleted any medications: No Patient Arrived: Ambulatory Any new allergies or adverse reactions: No Arrival Time: 14:57 Had a fall or experienced change in No Accompanied By: alone activities of daily living that may affect Transfer Assistance: None risk of falls: Patient Identification Verified: Yes Signs or symptoms of abuse/neglect since last visito No Secondary Verification Process Completed: Yes Hospitalized since last visit: No Patient Requires Transmission-Based Precautions: No Implantable device outside of the clinic excluding No Patient Has Alerts: Yes cellular tissue based products placed in the center Patient Alerts: Patient on Blood Thinner since last visit: on warfarin Has Dressing in Place as Prescribed: Yes Bilateral ABI: Roy Has Compression in Place as Prescribed: Yes Pain Present Now: No Electronic Signature(s) Signed: 05/31/2020 5:28:50 PM By: Levan Hurst RN, [name] Entered By: Levan Hurst on 05/31/2020 14:58:20 -------------------------------------------------------------------------------- Compression Therapy Details Patient Name: Date of Service: [name] 05/31/2020 2:45 PM Medical Record Number: [id number] Patient Account Number: [id number] Date of Birth/Sex: Treating RN: [birth year] (74 y.o. [name] Primary Care Abou Sterkel: [name] Other Clinician: Referring Azjah Pardo: Treating Ainsley Sanguinetti/Extender: Drucilla Schmidt Weeks in  Treatment: 14 Compression Therapy Performed for Wound Assessment: Wound #1 Right,Medial Lower Leg Performed By: Clinician Baruch Gouty, RN Compression Type: Four Layer Post Procedure Diagnosis Same as Pre-procedure Electronic Signature(s) Signed: 05/31/2020 5:39:22 PM By: Deon Pilling Entered By: Deon Pilling on 05/31/2020 15:46:19 -------------------------------------------------------------------------------- Encounter Discharge Information Details Patient Name: Date of Service: [name] 05/31/2020 2:45 PM Medical Record Number: [id number] Patient Account Number: [id number] Date of Birth/Sex: Treating RN: 08/02/1945 (75 y.o. [name] Primary Care Ridley Dileo: [name] Other Clinician: Referring Carlisia Geno: Treating Kataryna Mcquilkin/Extender: Drucilla Schmidt Weeks in Treatment: 14 Encounter Discharge Information Items Post Procedure Vitals Discharge Condition: Stable Temperature (F): 98.2 Ambulatory Status: Ambulatory Pulse (bpm): 99 Discharge Destination: Home Respiratory Rate (breaths/min): 18 Transportation: Private Auto Blood Pressure (mmHg): 188/73 Accompanied By: self Schedule Follow-up Appointment: Yes Clinical Summary of Care: Patient Declined Electronic Signature(s) Signed: 05/31/2020 5:40:31 PM By: Baruch Gouty RN, [name] Entered By: Baruch Gouty on 05/31/2020 16:46:24 -------------------------------------------------------------------------------- Lower Extremity Assessment Details Patient Name: Date of Service: [name] 05/31/2020 2:45 PM Medical Record Number: [id number] Patient Account Number: [id number] Date of Birth/Sex: Treating RN: 08-29-1945 (74 y.o. Janyth Contes Primary Care Ludger Bones: [name] Other Clinician: Referring Diamante Truszkowski: Treating Camelle Henkels/Extender: Drucilla Schmidt Weeks in Treatment: 14 Edema Assessment Assessed: [name]: No] [name]: No] Edema: [Left: Yes] [Right:  Yes] Calf Left: Right: Point of Measurement: 46 cm From Medial Instep 44 cm 44 cm Ankle Left: Right: Point of Measurement: 15 cm From Medial Instep 34.8 cm 33.5 cm Vascular Assessment Pulses: Dorsalis Pedis Palpable: [Left:Yes] [Right:Yes] Electronic Signature(s) Signed: 05/31/2020 5:28:50 PM By: Levan Hurst RN, [name] Entered By: Levan Hurst on 05/31/2020 15:13:01 -------------------------------------------------------------------------------- Multi Wound Chart Details Patient Name: Date of Service: [name] 05/31/2020 2:45 PM Medical Record Number: [id number] Patient Account Number: [id number] Date of Birth/Sex: Treating RN: [birth year] (74 y.o. [name] Primary Care Shelie Lansing: [name] Other Clinician: Referring Yarden Hillis: Treating Toneisha Savary/Extender: Dellia Nims  Kathryne Eriksson, [name] Weeks in Treatment: 14 Vital Signs Height(in): Pulse(bpm): 99 Weight(lbs): Blood Pressure(mmHg): 188/73 Body Mass Index(BMI): Temperature(F): 98.2 Respiratory Rate(breaths/min): 18 Photos: [1:No Photos Right, Medial Lower Leg] [2:No Photos Left, Lateral Lower Leg] [N/A:N/A N/A] Wound Location: [1:Blister] [2:Blister] [N/A:N/A] Wounding Event: [1:Lymphedema] [2:Lymphedema] [N/A:N/A] Primary Etiology: [1:Lymphedema, Deep Vein Thrombosis, Lymphedema, Deep Vein Thrombosis,] [N/A:N/A] Comorbid History: [1:Hypertension, Received Radiation 01/15/2020] [2:Hypertension, Received Radiation 01/15/2020] [N/A:N/A] Date Acquired: [1:14] [2:14] [N/A:N/A] Weeks of Treatment: [1:Open] [2:Healed - Epithelialized] [N/A:N/A] Wound Status: [1:0.4x1.7x0.6] [2:0x0x0] [N/A:N/A] Measurements L x W x D (cm) [1:0.534] [2:0] [N/A:N/A] A (cm) : rea [1:0.32] [2:0] [N/A:N/A] Volume (cm) : [1:32.00%] [2:100.00%] [N/A:N/A] % Reduction in A [1:rea: -305.10%] [2:100.00%] [N/A:N/A] % Reduction in Volume: [1:Full Thickness Without Exposed] [2:Full Thickness Without Exposed] [N/A:N/A] Classification:  [1:Support Structures Medium] [2:Support Structures None Present] [N/A:N/A] Exudate A mount: [1:Serosanguineous] [2:N/A] [N/A:N/A] Exudate Type: [1:red, brown] [2:N/A] [N/A:N/A] Exudate Color: [1:Distinct, outline attached] [2:Indistinct, nonvisible] [N/A:N/A] Wound Margin: [1:Large (67-100%)] [2:None Present (0%)] [N/A:N/A] Granulation A mount: [1:Red, Pink] [2:N/A] [N/A:N/A] Granulation Quality: [1:None Present (0%)] [2:None Present (0%)] [N/A:N/A] Necrotic A mount: [1:Fat Layer (Subcutaneous Tissue): Yes Fascia: No] [N/A:N/A] Exposed Structures: [1:Fascia: No Tendon: No Muscle: No Joint: No Bone: No None] [2:Fat Layer (Subcutaneous Tissue): No Tendon: No Muscle: No Joint: No Bone: No Large (67-100%)] [N/A:N/A] Epithelialization: [1:Debridement - Excisional] [2:N/A] [N/A:N/A] Debridement: Pre-procedure Verification/Time Out 15:45 [2:N/A] [N/A:N/A] Taken: [1:Lidocaine 4% T opical Solution] [2:N/A] [N/A:N/A] Pain Control: [1:Subcutaneous, Slough] [2:N/A] [N/A:N/A] Tissue Debrided: [1:Skin/Subcutaneous Tissue] [2:N/A] [N/A:N/A] Level: [1:2] [2:N/A] [N/A:N/A] Debridement A (sq cm): [1:rea Curette] [2:N/A] [N/A:N/A] Instrument: [1:Moderate] [2:N/A] [N/A:N/A] Bleeding: [1:Silver Nitrate] [2:N/A] [N/A:N/A] Hemostasis A chieved: [1:0] [2:N/A] [N/A:N/A] Procedural Pain: [1:0] [2:N/A] [N/A:N/A] Post Procedural Pain: [1:Procedure was tolerated well] [2:N/A] [N/A:N/A] Debridement Treatment Response: [1:0.5x0.7x0.6] [2:N/A] [N/A:N/A] Post Debridement Measurements L x W x D (cm) [1:0.165] [2:N/A] [N/A:N/A] Post Debridement Volume: (cm) [1:Compression Therapy] [2:N/A] [N/A:N/A] Procedures Performed: [1:Debridement] Treatment Notes Electronic Signature(s) Signed: 05/31/2020 5:01:02 PM By: Linton Ham MD Signed: 05/31/2020 5:39:22 PM By: Deon Pilling Entered By: Linton Ham on 05/31/2020  16:35:00 -------------------------------------------------------------------------------- Multi-Disciplinary Care Plan Details Patient Name: Date of Service: Mijangos, DA [name] 05/31/2020 2:45 PM Medical Record Number: [id number] Patient Account Number: [id number] Date of Birth/Sex: Treating RN: 11-11-1945 (74 y.o. [name] Primary Care Efrem Pitstick: [name] Other Clinician: Referring Aerianna Losey: Treating Miquela Costabile/Extender: Drucilla Schmidt Weeks in Treatment: 14 Active Inactive Pain, Acute or Chronic Nursing Diagnoses: Pain, acute or chronic: actual or potential Potential alteration in comfort, pain Goals: Patient will verbalize adequate pain control and receive pain control interventions during procedures as needed Date Initiated: 02/23/2020 Target Resolution Date: 06/14/2020 Goal Status: Active Patient/caregiver will verbalize comfort level met Date Initiated: 02/23/2020 Date Inactivated: 03/22/2020 Target Resolution Date: 03/23/2020 Goal Status: Met Interventions: Provide education on pain management Reposition patient for comfort Notes: Wound/Skin Impairment Nursing Diagnoses: Knowledge deficit related to ulceration/compromised skin integrity Goals: Ulcer/skin breakdown will heal within 14 weeks Date Initiated: 02/23/2020 Target Resolution Date: 06/15/2020 Goal Status: Active Interventions: Assess patient/caregiver ability to obtain necessary supplies Assess patient/caregiver ability to perform ulcer/skin care regimen upon admission and as needed Provide education on ulcer and skin care Treatment Activities: Skin care regimen initiated : 02/23/2020 Topical wound management initiated : 02/23/2020 Notes: Electronic Signature(s) Signed: 05/31/2020 5:39:22 PM By: Deon Pilling Entered By: Deon Pilling on 05/31/2020 15:41:59 -------------------------------------------------------------------------------- Pain Assessment Details Patient Name: Date  of Service: Viviani, [place] 05/31/2020 2:45 PM Medical Record Number: [id number] Patient Account  Number: [id number] Date of Birth/Sex: Treating RN: 1945-10-21 (75 y.o. Janyth Contes Primary Care Dayna Geurts: [name] Other Clinician: Referring Grettell Ransdell: Treating Johnothan Bascomb/Extender: Drucilla Schmidt Weeks in Treatment: 14 Active Problems Location of Pain Severity and Description of Pain Patient Has Paino No Site Locations Pain Management and Medication Current Pain Management: Electronic Signature(s) Signed: 05/31/2020 5:28:50 PM By: Levan Hurst RN, [name] Entered By: Levan Hurst on 05/31/2020 14:58:36 -------------------------------------------------------------------------------- Patient/Caregiver Education Details Patient Name: Date of Service: Grandinetti, DA [name] 1/13/2022andnbsp2:45 PM Medical Record Number: [id number] Patient Account Number: [id number] Date of Birth/Gender: Treating RN: 1946-02-06 (75 y.o. [name] Primary Care Physician: [name] Other Clinician: Referring Physician: Treating Physician/Extender: Alycia Rossetti in Treatment: 14 Education Assessment Education Provided To: Patient Education Topics Provided Pain: Handouts: A Guide to Pain Control Methods: Explain/Verbal Responses: Reinforcements needed Electronic Signature(s) Signed: 05/31/2020 5:39:22 PM By: Deon Pilling Entered By: Deon Pilling on 05/31/2020 15:42:08 -------------------------------------------------------------------------------- Wound Assessment Details Patient Name: Date of Service: Baird, DA [name] 05/31/2020 2:45 PM Medical Record Number: [id number] Patient Account Number: [id number] Date of Birth/Sex: Treating RN: 03-Feb-1946 (74 y.o. Janyth Contes Primary Care Billee Balcerzak: [name] Other Clinician: Referring Atanacio Melnyk: Treating Phaedra Colgate/Extender: Drucilla Schmidt Weeks in Treatment:  14 Wound Status Wound Number: 1 Primary Lymphedema Etiology: Wound Location: Right, Medial Lower Leg Wound Status: Open Wounding Event: Blister Comorbid Lymphedema, Deep Vein Thrombosis, Hypertension, Date Acquired: 01/15/2020 History: Received Radiation Weeks Of Treatment: 14 Clustered Wound: No Wound Measurements Length: (cm) 0.4 Width: (cm) 1.7 Depth: (cm) 0.6 Area: (cm) 0.534 Volume: (cm) 0.32 % Reduction in Area: 32% % Reduction in Volume: -305.1% Epithelialization: None Tunneling: No Undermining: No Wound Description Classification: Full Thickness Without Exposed Support Structures Wound Margin: Distinct, outline attached Exudate Amount: Medium Exudate Type: Serosanguineous Exudate Color: red, brown Foul Odor After Cleansing: No Slough/Fibrino No Wound Bed Granulation Amount: Large (67-100%) Exposed Structure Granulation Quality: Red, Pink Fascia Exposed: No Necrotic Amount: None Present (0%) Fat Layer (Subcutaneous Tissue) Exposed: Yes Tendon Exposed: No Muscle Exposed: No Joint Exposed: No Bone Exposed: No Treatment Notes Wound #1 (Lower Leg) Wound Laterality: Right, Medial Cleanser Peri-Wound Care Triamcinolone 15 (g) Discharge Instruction: Use triamcinolone 15 (g) as directed. MIXED WITH LOTION. Sween Lotion (Moisturizing lotion) Discharge Instruction: Apply moisturizing lotion as directed Topical Primary Dressing Hydrofera Blue Classic Foam, 2x2 in Discharge Instruction: Moisten with saline prior to applying to wound bed Secondary Dressing Woven Gauze Sponge, Non-Sterile 4x4 in Discharge Instruction: Apply over primary dressing as directed. Secured With Compression Wrap FourPress (4 layer compression wrap) Discharge Instruction: Apply four layer compression as directed. APPLY UNNA BOOT FIRST LAYER TO UPPER PORTION OF LOWER LEG. Compression Stockings Add-Ons Electronic Signature(s) Signed: 05/31/2020 5:28:50 PM By: Levan Hurst RN,  [name] Entered By: Levan Hurst on 05/31/2020 15:13:41 -------------------------------------------------------------------------------- Wound Assessment Details Patient Name: Date of Service: Jelinek, DA [name] 05/31/2020 2:45 PM Medical Record Number: [id number] Patient Account Number: [id number] Date of Birth/Sex: Treating RN: [birth year] (75 y.o. Janyth Contes Primary Care Ollen Rao: [name] Other Clinician: Referring Driana Dazey: Treating Abdulahad Mederos/Extender: Drucilla Schmidt Weeks in Treatment: 14 Wound Status Wound Number: 2 Primary Lymphedema Etiology: Wound Location: Left, Lateral Lower Leg Wound Status: Healed - Epithelialized Wounding Event: Blister Comorbid Lymphedema, Deep Vein Thrombosis, Hypertension, Date Acquired: 01/15/2020 History: Received Radiation Weeks Of Treatment: 14 Clustered Wound: No Wound Measurements Length: (cm) Width: (cm) Depth: (cm) Area: (cm) Volume: (cm) 0 % Reduction in Area:  100% 0 % Reduction in Volume: 100% 0 Epithelialization: Large (67-100%) 0 Tunneling: No 0 Undermining: No Wound Description Classification: Full Thickness Without Exposed Support Structures Wound Margin: Indistinct, nonvisible Exudate Amount: None Present Foul Odor After Cleansing: No Slough/Fibrino No Wound Bed Granulation Amount: None Present (0%) Exposed Structure Necrotic Amount: None Present (0%) Fascia Exposed: No Fat Layer (Subcutaneous Tissue) Exposed: No Tendon Exposed: No Muscle Exposed: No Joint Exposed: No Bone Exposed: No Electronic Signature(s) Signed: 05/31/2020 5:28:50 PM By: Levan Hurst RN, [name] Entered By: Levan Hurst on 05/31/2020 15:13:53 -------------------------------------------------------------------------------- [place] Details Patient Name: Date of Service: Igou, DA [name] 05/31/2020 2:45 PM Medical Record Number: [id number] Patient Account Number: [id number] Date of Birth/Sex: Treating RN: [birth year]  (75 y.o. Janyth Contes Primary Care Amando Ishikawa: [name] Other Clinician: Referring Hamsa Laurich: Treating Candon Caras/Extender: Drucilla Schmidt Weeks in Treatment: 14 Vital Signs Time Taken: 15:00 Temperature (F): 98.2 Pulse (bpm): 99 Respiratory Rate (breaths/min): 18 Blood Pressure (mmHg): 188/73 Reference Range: 80 - 120 mg / dl Electronic Signature(s) Signed: 05/31/2020 5:28:50 PM By: Levan Hurst RN, [name] Entered By: Levan Hurst on 05/31/2020 15:00:48

## 2020-05-31 NOTE — Progress Notes (Signed)
Mynhier, YOUSSEF ([id number]) Visit Report for 05/31/2020 Debridement Details Patient Name: Date of Service: [name] 05/31/2020 2:45 PM Medical Record Number: [id number] Patient Account Number: [id number] Date of Birth/Sex: Treating RN: 01/09/1946 (75 y.o. [name] Primary Care Provider: [name] Other Clinician: Referring Provider: Treating Provider/Extender: Drucilla Schmidt Weeks in Treatment: 14 Debridement Performed for Assessment: Wound #1 Right,Medial Lower Leg Performed By: Physician Ricard Dillon., MD Debridement Type: Debridement Level of Consciousness (Pre-procedure): Awake and Alert Pre-procedure Verification/Time Out Yes - 15:45 Taken: Start Time: 15:46 Pain Control: Lidocaine 4% T opical Solution T Area Debrided (L x W): otal 1 (cm) x 2 (cm) = 2 (cm) Tissue and other material debrided: Viable, Non-Viable, Slough, Subcutaneous, Skin: Dermis , Skin: Epidermis, Fibrin/Exudate, Slough Level: Skin/Subcutaneous Tissue Debridement Description: Excisional Instrument: Curette Bleeding: Moderate Hemostasis Achieved: Silver Nitrate End Time: 15:51 Procedural Pain: 0 Post Procedural Pain: 0 Response to Treatment: Procedure was tolerated well Level of Consciousness (Post- Awake and Alert procedure): Post Debridement Measurements of Total Wound Length: (cm) 0.5 Width: (cm) 0.7 Depth: (cm) 0.6 Volume: (cm) 0.165 Character of Wound/Ulcer Post Debridement: Improved Post Procedure Diagnosis Same as Pre-procedure Electronic Signature(s) Signed: 05/31/2020 5:01:02 PM By: Linton Ham MD Signed: 05/31/2020 5:39:22 PM By: Deon Pilling Entered By: Linton Ham on 05/31/2020 16:36:30 -------------------------------------------------------------------------------- HPI Details Patient Name: Date of Service: [name] 05/31/2020 2:45 PM Medical Record Number: [id number] Patient Account Number: [id number] Date of Birth/Sex:  Treating RN: 1945-10-01 (75 y.o. [name] Primary Care Provider: [name] Other Clinician: Referring Provider: Treating Provider/Extender: Drucilla Schmidt Weeks in Treatment: 14 History of Present Illness HPI Description: ADMISSION 02/23/2020 Patient is a 75 year old man who lives in [place] who arrives accompanied by his wife. He has a history of chronic lymphedema and venous insufficiency in his bilateral lower legs which may have something to do that with having a history of DVT as well as being treated for prostate cancer. In any case he recently got compression pumps at home but compliance has been an issue here. He has compression stockings however they are probably not sufficient enough to control swelling. They tell us that things deteriorated for him in late August he was admitted to [facility] for 7 days. This was with cellulitis I think of his bilateral lower legs. Discharge he was noted to have wounds on his bilateral lower legs. He was discharged on Bactrim. They tried to get him home health through [facility] part [facility] of course they declined him. His wife is been wrapping these applying some form of silver foam dressing. He has a history of wounds before although nothing that would not heal with basic home topical dressings. He has 2 areas on the left medial, left anterior and left lateral and a smaller area on the right medial. All of these have considerable depth. Past medical history includes iron deficiency anemia, lymphedema followed by the rehab center at [facility] with lymphedema wraps I believe, DVT on chronic anticoagulation, prostate cancer, chronic venous insufficiency, hypertension. As mentioned he has compression pumps but does not use them. ABIs in our clinic were noncompressible bilaterally 10/14; patient with severe bilateral lymphedema right greater than left. He came in with bilateral lower extremity wounds left  greater than right. Even though the right side has more of the edema most of the wounds here almost closed on the right medial. He has 3 remaining wounds on the left We have  been using silver alginate under 4-layer compression I have been trying to get him to be compliant with his external compression pumps 10/21; patient with 3 small wounds on the left leg and 1 on the right medial in the setting of severe lymphedema and chronic venous insufficiency. We have been using silver alginate under 4-layer compression he is using his external compression pumps twice a day 11/4; ARTERIAL STUDIES on the right show an ABI of 1.02 TBI of 0.858 with biphasic waveforms on the left 0.98 with a TBI of 0.55 and biphasic waveforms. Does not look like he has significant arterial disease. We are treating him for lymphedema he has compression pumps. He has punched-out areas on the left anterior left lateral and right medial lower extremities 11/11; after we obtained his arterial studies I put him in 4 layer compression. He is using his compression pumps probably once a day although I have asked him to do twice. Primary dressing to the wound is silver collagen he has severe lymphedema likely secondary to chronic venous insufficiency. Wounds on the left lateral, left medial and left anterior and a small area on the right medial 12/2; the area on the right anterior lower leg has healed. We initially thought that the area medially had healed as well however when her discharge nurse came in she detected fluid in the wound simply opened up. This is actually worse than I remember this pain. The area on the left lateral potentially slightly smaller He is also complaining about pain in his left hand he says that this is actually been getting some better he has been using topical creams on this. She asked that I look at this 12/9 after last weeks issues we have 2 wounds one on the right medial lower leg and 1 on the left lateral.  Both of these are in the same condition. I think because of thickened skin secondary to chronic lymphedema these wounds actually have depth of almost 0.8 cm. 12/16; the patient has 2 small but deep wounds one on the right medial and one on the left lateral. The right medial is actually the worst of these. He arrives in clinic today with absolutely terrible edema in the right leg apparently his 4-layer wrap fell down to just above his ankle he did not think about this he is apparently been continuing to use his compression pump twice a day. The left leg looks a lot better. 05/09/2020 upon evaluation today patient appears to be doing decently well in regard to his wounds. Everything is measuring smaller the right leg still has a little bit deeper wound in the left seems to be almost completely healed in my opinion I am very pleased in general with how things are progressing. He has a 4- layer compression wrap we have been using endoform today we will probably have to use collagen just based on the fact that we do not have endoform it is on order. 1/6; the patient's wound on the left lateral lower leg has healed. Still has 1 on the right medial. 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 Using compression pumps at home twice a day. 1/13; left lateral lower leg is still healed. He has a deep punched out rectangular shaped wound on the right medial calf. Looking down at this it appears that he is attempting to epithelialize around the edges of the wound and on the base as well. His edema is reasonably well controlled we have been using collagen with  absolutely no effect Electronic Signature(s) Signed: 05/31/2020 5:01:02 PM By: Linton Ham MD Entered By: Linton Ham on 05/31/2020 16:37:33 -------------------------------------------------------------------------------- Physical Exam Details Patient Name: Date of Service: Bebeau, DA [name] 05/31/2020 2:45 PM Medical Record Number:  [id number] Patient Account Number: [id number] Date of Birth/Sex: Treating RN: March 06, 1946 (75 y.o. [name] Primary Care Provider: [name] Other Clinician: Referring Provider: Treating Provider/Extender: Drucilla Schmidt Weeks in Treatment: 14 Notes Wound exam; odd rectangular shaped wound with considerable depth. The patient has very slight thick surrounding skin here. There appeared to be epithelialization on the sides of the wound. No evidence of surrounding infection I used a #3 curette to knock down the wound margins clean up the surface of the wound. Hemostasis with direct pressure Electronic Signature(s) Signed: 05/31/2020 5:01:02 PM By: Linton Ham MD Entered By: Linton Ham on 05/31/2020 16:42:05 -------------------------------------------------------------------------------- Physician Orders Details Patient Name: Date of Service: Robben, DA [name] 05/31/2020 2:45 PM Medical Record Number: [id number] Patient Account Number: [id number] Date of Birth/Sex: Treating RN: [birth year] (75 y.o. [name] Primary Care Provider: [name] Other Clinician: Referring Provider: Treating Provider/Extender: Drucilla Schmidt Weeks in Treatment: 46 Verbal / Phone Orders: No Diagnosis Coding ICD-10 Coding Code Description I89.0 Lymphedema, not elsewhere classified L97.822 Non-pressure chronic ulcer of other part of left lower leg with fat layer exposed L97.812 Non-pressure chronic ulcer of other part of right lower leg with fat layer exposed Follow-up Appointments Return Appointment in 1 week. Cellular or Tissue Based Products Wound #1 Right,Medial Lower Leg Cellular or Tissue Based Product Type: - wound center to run insurance for approval for single layer oasis. Bathing/ Shower/ Hygiene May shower with protection but do not get wound dressing(s) wet. - use a cast protector. Edema Control - Lymphedema / SCD /  Other Bilateral Lower Extremities Lymphedema Pumps. Use Lymphedema pumps on leg(s) 2-3 times a day for 45-60 minutes. If wearing any wraps or hose, do not remove them. Continue exercising as instructed. - 3 times a day. Elevate legs to the level of the heart or above for 30 minutes daily and/or when sitting, a frequency of: - 3-4 times Avoid standing for long periods of time. Exercise regularly Compression stocking or Garment 30-40 mm/Hg pressure to: - patient to apply extremit-ease to left leg in the morning and remove at night. patient to apply lotion to left leg every night after removing the compression garment. Wound Treatment Wound #1 - Lower Leg Wound Laterality: Right, Medial Peri-Wound Care: Triamcinolone 15 (g) 1 x Per Week Discharge Instructions: Use triamcinolone 15 (g) as directed. MIXED WITH LOTION. Peri-Wound Care: Sween Lotion (Moisturizing lotion) 1 x Per Week Discharge Instructions: Apply moisturizing lotion as directed Prim Dressing: Hydrofera Blue Classic Foam, 2x2 in 1 x Per Week ary Discharge Instructions: Moisten with saline prior to applying to wound bed Secondary Dressing: Woven Gauze Sponge, Non-Sterile 4x4 in 1 x Per Week Discharge Instructions: Apply over primary dressing as directed. Compression Wrap: FourPress (4 layer compression wrap) 1 x Per Week Discharge Instructions: Apply four layer compression as directed. APPLY UNNA BOOT FIRST LAYER TO UPPER PORTION OF LOWER LEG. Electronic Signature(s) Signed: 05/31/2020 5:01:02 PM By: Linton Ham MD Signed: 05/31/2020 5:39:22 PM By: Deon Pilling Entered By: Deon Pilling on 05/31/2020 15:50:20 -------------------------------------------------------------------------------- Problem List Details Patient Name: Date of Service: Curto, DA [name] 05/31/2020 2:45 PM Medical Record Number: [id number] Patient Account Number: [id number] Date of Birth/Sex: Treating RN: 06-04-1945 (75 y.o. [name], [name] Primary  Care Provider: [name] Other Clinician: Referring Provider: Treating Provider/Extender: Drucilla Schmidt Weeks in Treatment: 14 Active Problems ICD-10 Encounter Code Description Active Date MDM Diagnosis I89.0 Lymphedema, not elsewhere classified 02/23/2020 No Yes L97.812 Non-pressure chronic ulcer of other part of right lower leg with fat layer 02/23/2020 No Yes exposed Inactive Problems ICD-10 Code Description Active Date Inactive Date L97.822 Non-pressure chronic ulcer of other part of left lower leg with fat layer exposed 02/23/2020 02/23/2020 Resolved Problems Electronic Signature(s) Signed: 05/31/2020 5:01:02 PM By: Linton Ham MD Entered By: Linton Ham on 05/31/2020 16:34:39 -------------------------------------------------------------------------------- Progress Note Details Patient Name: Date of Service: Bourque, DA [name] 05/31/2020 2:45 PM Medical Record Number: [id number] Patient Account Number: [id number] Date of Birth/Sex: Treating RN: March 04, 1946 (75 y.o. [name] Primary Care Provider: [name] Other Clinician: Referring Provider: Treating Provider/Extender: Drucilla Schmidt Weeks in Treatment: 14 Subjective History of Present Illness (HPI) ADMISSION 02/23/2020 Patient is a 75 year old man who lives in [place] who arrives accompanied by his wife. He has a history of chronic lymphedema and venous insufficiency in his bilateral lower legs which may have something to do that with having a history of DVT as well as being treated for prostate cancer. In any case he recently got compression pumps at home but compliance has been an issue here. He has compression stockings however they are probably not sufficient enough to control swelling. They tell us that things deteriorated for him in late August he was admitted to [facility] for 7 days. This was with cellulitis I think of his bilateral lower legs. Discharge he  was noted to have wounds on his bilateral lower legs. He was discharged on Bactrim. They tried to get him home health through [facility] part C of course they declined him. His wife is been wrapping these applying some form of silver foam dressing. He has a history of wounds before although nothing that would not heal with basic home topical dressings. He has 2 areas on the left medial, left anterior and left lateral and a smaller area on the right medial. All of these have considerable depth. Past medical history includes iron deficiency anemia, lymphedema followed by the rehab center at [facility] with lymphedema wraps I believe, DVT on chronic anticoagulation, prostate cancer, chronic venous insufficiency, hypertension. As mentioned he has compression pumps but does not use them. ABIs in our clinic were noncompressible bilaterally 10/14; patient with severe bilateral lymphedema right greater than left. He came in with bilateral lower extremity wounds left greater than right. Even though the right side has more of the edema most of the wounds here almost closed on the right medial. He has 3 remaining wounds on the left We have been using silver alginate under 4-layer compression I have been trying to get him to be compliant with his external compression pumps 10/21; patient with 3 small wounds on the left leg and 1 on the right medial in the setting of severe lymphedema and chronic venous insufficiency. We have been using silver alginate under 4-layer compression he is using his external compression pumps twice a day 11/4; ARTERIAL STUDIES on the right show an ABI of 1.02 TBI of 0.858 with biphasic waveforms on the left 0.98 with a TBI of 0.55 and biphasic waveforms. Does not look like he has significant arterial disease. We are treating him for lymphedema he has compression pumps. He has punched-out areas on the left anterior left lateral and  right medial lower  extremities 11/11; after we obtained his arterial studies I put him in 4 layer compression. He is using his compression pumps probably once a day although I have asked him to do twice. Primary dressing to the wound is silver collagen he has severe lymphedema likely secondary to chronic venous insufficiency. Wounds on the left lateral, left medial and left anterior and a small area on the right medial 12/2; the area on the right anterior lower leg has healed. We initially thought that the area medially had healed as well however when her discharge nurse came in she detected fluid in the wound simply opened up. This is actually worse than I remember this pain. The area on the left lateral potentially slightly smaller He is also complaining about pain in his left hand he says that this is actually been getting some better he has been using topical creams on this. She asked that I look at this 12/9 after last weeks issues we have 2 wounds one on the right medial lower leg and 1 on the left lateral. Both of these are in the same condition. I think because of thickened skin secondary to chronic lymphedema these wounds actually have depth of almost 0.8 cm. 12/16; the patient has 2 small but deep wounds one on the right medial and one on the left lateral. The right medial is actually the worst of these. He arrives in clinic today with absolutely terrible edema in the right leg apparently his 4-layer wrap fell down to just above his ankle he did not think about this he is apparently been continuing to use his compression pump twice a day. The left leg looks a lot better. 05/09/2020 upon evaluation today patient appears to be doing decently well in regard to his wounds. Everything is measuring smaller the right leg still has a little bit deeper wound in the left seems to be almost completely healed in my opinion I am very pleased in general with how things are progressing. He has a 4- layer compression wrap we  have been using endoform today we will probably have to use collagen just based on the fact that we do not have endoform it is on order. 1/6; the patient's wound on the left lateral lower leg has healed. Still has 1 on the right medial. 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 Using compression pumps at home twice a day. 1/13; left lateral lower leg is still healed. He has a deep punched out rectangular shaped wound on the right medial calf. Looking down at this it appears that he is attempting to epithelialize around the edges of the wound and on the base as well. His edema is reasonably well controlled we have been using collagen with absolutely no effect Objective Constitutional Vitals Time Taken: 3:00 PM, Temperature: 98.2 F, Pulse: 99 bpm, Respiratory Rate: 18 breaths/min, Blood Pressure: 188/73 mmHg. Integumentary (Hair, Skin) Wound #1 status is Open. Original cause of wound was Blister. The wound is located on the Right,Medial Lower Leg. The wound measures 0.4cm length x 1.7cm width x 0.6cm depth; 0.534cm^2 area and 0.32cm^3 volume. There is Fat Layer (Subcutaneous Tissue) exposed. There is no tunneling or undermining noted. There is a medium amount of serosanguineous drainage noted. The wound margin is distinct with the outline attached to the wound base. There is large (67-100%) red, pink granulation within the wound bed. There is no necrotic tissue within the wound bed. Wound #2 status is Healed -  Epithelialized. Original cause of wound was Blister. The wound is located on the Left,Lateral Lower Leg. The wound measures 0cm length x 0cm width x 0cm depth; 0cm^2 area and 0cm^3 volume. There is no tunneling or undermining noted. There is a none present amount of drainage noted. The wound margin is indistinct and nonvisible. There is no granulation within the wound bed. There is no necrotic tissue within the wound bed. Assessment Active Problems ICD-10 Lymphedema, not  elsewhere classified Non-pressure chronic ulcer of other part of right lower leg with fat layer exposed Procedures Wound #1 Pre-procedure diagnosis of Wound #1 is a Lymphedema located on the Right,Medial Lower Leg . There was a Excisional Skin/Subcutaneous Tissue Debridement with a total area of 2 sq cm performed by Ricard Dillon., MD. With the following instrument(s): Curette to remove Viable and Non-Viable tissue/material. Material removed includes Subcutaneous Tissue, Slough, Skin: Dermis, Skin: Epidermis, and Fibrin/Exudate after achieving pain control using Lidocaine 4% T opical Solution. A time out was conducted at 15:45, prior to the start of the procedure. A Moderate amount of bleeding was controlled with Silver Nitrate. The procedure was tolerated well with a pain level of 0 throughout and a pain level of 0 following the procedure. Post Debridement Measurements: 0.5cm length x 0.7cm width x 0.6cm depth; 0.165cm^3 volume. Character of Wound/Ulcer Post Debridement is improved. Post procedure Diagnosis Wound #1: Same as Pre-Procedure Pre-procedure diagnosis of Wound #1 is a Lymphedema located on the Right,Medial Lower Leg . There was a Four Layer Compression Therapy Procedure by Baruch Gouty, RN. Post procedure Diagnosis Wound #1: Same as Pre-Procedure Plan Follow-up Appointments: Return Appointment in 1 week. Cellular or Tissue Based Products: Wound #1 Right,Medial Lower Leg: Cellular or Tissue Based Product Type: - wound center to run insurance for approval for single layer oasis. Bathing/ Shower/ Hygiene: May shower with protection but do not get wound dressing(s) wet. - use a cast protector. Edema Control - Lymphedema / SCD / Other: Lymphedema Pumps. Use Lymphedema pumps on leg(s) 2-3 times a day for 45-60 minutes. If wearing any wraps or hose, do not remove them. Continue exercising as instructed. - 3 times a day. Elevate legs to the level of the heart or above for 30  minutes daily and/or when sitting, a frequency of: - 3-4 times Avoid standing for long periods of time. Exercise regularly Compression stocking or Garment 30-40 mm/Hg pressure to: - patient to apply extremit-ease to left leg in the morning and remove at night. patient to apply lotion to left leg every night after removing the compression garment. WOUND #1: - Lower Leg Wound Laterality: Right, Medial Peri-Wound Care: Triamcinolone 15 (g) 1 x Per Week/ Discharge Instructions: Use triamcinolone 15 (g) as directed. MIXED WITH LOTION. Peri-Wound Care: Sween Lotion (Moisturizing lotion) 1 x Per Week/ Discharge Instructions: Apply moisturizing lotion as directed Prim Dressing: Hydrofera Blue Classic Foam, 2x2 in 1 x Per Week/ ary Discharge Instructions: Moisten with saline prior to applying to wound bed Secondary Dressing: Woven Gauze Sponge, Non-Sterile 4x4 in 1 x Per Week/ Discharge Instructions: Apply over primary dressing as directed. Com pression Wrap: FourPress (4 layer compression wrap) 1 x Per Week/ Discharge Instructions: Apply four layer compression as directed. APPLY UNNA BOOT FIRST LAYER TO UPPER PORTION OF LOWER LEG. 1. The patient has bilateral extremitease stockings and we put one on the left leg today there was no open wound here 2. After some thought I elected to go ahead and try to aggressively close the area  on the right medial. Towards the end and an aggressive debridement around of skin grounding thick skin and subcutaneous tissue. I have also put through an order for Oasis 3. Hydrofera Blue as the primary dressing for now Electronic Signature(s) Signed: 05/31/2020 5:01:02 PM By: Linton Ham MD Entered By: Linton Ham on 05/31/2020 16:43:16 -------------------------------------------------------------------------------- SuperBill Details Patient Name: Date of Service: Clevinger, DA [name] 05/31/2020 Medical Record Number: [id number] Patient Account Number: [id number] Date  of Birth/Sex: Treating RN: 09-26-1945 (75 y.o. [name] Primary Care Provider: [name] Other Clinician: Referring Provider: Treating Provider/Extender: Drucilla Schmidt Weeks in Treatment: 14 Diagnosis Coding ICD-10 Codes Code Description I89.0 Lymphedema, not elsewhere classified [id number] Non-pressure chronic ulcer of other part of right lower leg with fat layer exposed Facility Procedures CPT4 Code: [id number] Description: [place] TISSUE 20 SQ CM/< ICD-10 Diagnosis Description L97.812 Non-pressure chronic ulcer of other part of right lower leg with fat layer exp Modifier: osed Quantity: 1 Physician Procedures : CPT4 Code Description Modifier [id number] - WC PHYS SUBQ TISS 20 SQ CM ICD-10 Diagnosis Description [id number] Non-pressure chronic ulcer of other part of right lower leg with fat layer exposed Quantity: 1 Electronic Signature(s) Signed: 05/31/2020 5:01:02 PM By: Linton Ham MD Entered By: Linton Ham on 05/31/2020 16:43:33

## 2020-06-07 ENCOUNTER — Encounter (HOSPITAL_BASED_OUTPATIENT_CLINIC_OR_DEPARTMENT_OTHER): Payer: Medicare Other | Admitting: Internal Medicine

## 2020-06-07 ENCOUNTER — Other Ambulatory Visit: Payer: Self-pay

## 2020-06-07 DIAGNOSIS — I87331 Chronic venous hypertension (idiopathic) with ulcer and inflammation of right lower extremity: Secondary | ICD-10-CM | POA: Diagnosis not present

## 2020-06-07 NOTE — Progress Notes (Signed)
Kozel, DAYLN ([id number]) Visit Report for 06/07/2020 Arrival Information Details Patient Name: Date of Service: [name] 06/07/2020 12:30 PM Medical Record Number: [id number] Patient Account Number: [id number] Date of Birth/Sex: Treating RN: 08-Oct-1945 (75 y.o. [name] Primary Care Athziri Freundlich: [name] Other Clinician: Referring Toney Difatta: Treating Marlaina Coburn/Extender: Drucilla Schmidt Weeks in Treatment: 15 Visit Information History Since Last Visit Added or deleted any medications: No Patient Arrived: Ambulatory Any new allergies or adverse reactions: No Arrival Time: 12:37 Had a fall or experienced change in No Accompanied By: self activities of daily living that may affect Transfer Assistance: None risk of falls: Patient Identification Verified: Yes Signs or symptoms of abuse/neglect since last visito No Secondary Verification Process Completed: Yes Hospitalized since last visit: No Patient Requires Transmission-Based Precautions: No Implantable device outside of the clinic excluding No Patient Has Alerts: Yes cellular tissue based products placed in the center Patient Alerts: Patient on Blood Thinner since last visit: on warfarin Has Dressing in Place as Prescribed: Yes Bilateral ABI:  Pain Present Now: No Electronic Signature(s) Signed: 06/07/2020 2:45:22 PM By: Sandre Kitty Entered By: Sandre Kitty on 06/07/2020 12:37:28 -------------------------------------------------------------------------------- Compression Therapy Details Patient Name: Date of Service: [name] 06/07/2020 12:30 PM Medical Record Number: [id number] Patient Account Number: [id number] Date of Birth/Sex: Treating RN: [birth year] (74 y.o. [name] Primary Care Hughey Rittenberry: [name] Other Clinician: Referring Eilyn Polack: Treating Ravynn Hogate/Extender: Drucilla Schmidt Weeks in Treatment: 15 Compression Therapy Performed for  Wound Assessment: Wound #1 Right,Medial Lower Leg Performed By: Clinician Baruch Gouty, RN Compression Type: Four Layer Post Procedure Diagnosis Same as Pre-procedure Electronic Signature(s) Signed: 06/07/2020 4:56:08 PM By: Deon Pilling Entered By: Deon Pilling on 06/07/2020 13:25:03 -------------------------------------------------------------------------------- Encounter Discharge Information Details Patient Name: Date of Service: [name] 06/07/2020 12:30 PM Medical Record Number: [id number] Patient Account Number: [id number] Date of Birth/Sex: Treating RN: [birth year] (75 y.o. [name] Primary Care Lister Brizzi: [name] Other Clinician: Referring Ellsworth Waldschmidt: Treating Lawrence Roldan/Extender: Alycia Rossetti in Treatment: 15 Encounter Discharge Information Items Discharge Condition: Stable Ambulatory Status: Ambulatory Discharge Destination: Home Transportation: Private Auto Accompanied By: self Schedule Follow-up Appointment: Yes Clinical Summary of Care: Patient Declined Electronic Signature(s) Signed: 06/07/2020 4:58:08 PM By: Baruch Gouty RN, [name] Entered By: Baruch Gouty on 06/07/2020 13:47:30 -------------------------------------------------------------------------------- Lower Extremity Assessment Details Patient Name: Date of Service: [name] 06/07/2020 12:30 PM Medical Record Number: [id number] Patient Account Number: [id number] Date of Birth/Sex: Treating RN: [birth year] (75 y.o. [name] Primary Care Judithe Keetch: [name] Other Clinician: Referring Bereket Gernert: Treating Raylin Diguglielmo/Extender: Drucilla Schmidt Weeks in Treatment: 15 Edema Assessment Assessed: [name]: No] [name]: No] Edema: [Left: Ye] [Right: s] Calf Left: Right: Point of Measurement: 46 cm From Medial Instep 41.5 cm Ankle Left: Right: Point of Measurement: 15 cm From Medial Instep 33.5 cm Vascular  Assessment Pulses: Dorsalis Pedis Palpable: [Right:Yes] Electronic Signature(s) Signed: 06/07/2020 4:58:08 PM By: Baruch Gouty RN, [name] Entered By: Baruch Gouty on 06/07/2020 13:11:04 -------------------------------------------------------------------------------- Multi Wound Chart Details Patient Name: Date of Service: [name] 06/07/2020 12:30 PM Medical Record Number: [id number] Patient Account Number: [id number] Date of Birth/Sex: Treating RN: 10/23/1945 (74 y.o. [name] Primary Care Laketra Bowdish: [name] Other Clinician: Referring Regan Mcbryar: Treating Humza Tallerico/Extender: Drucilla Schmidt Weeks in Treatment: 15 Vital Signs Height(in): Pulse(bpm): 102 Weight(lbs): Blood Pressure(mmHg): 177/70 Body Mass Index(BMI): Temperature(F): 98.1 Respiratory Rate(breaths/min): 18 Photos: [1:No Photos Right, Medial Lower  Leg] [N/A:N/A N/A] Wound Location: [1:Blister] [N/A:N/A] Wounding Event: [1:Lymphedema] [N/A:N/A] Primary Etiology: [1:Lymphedema, Deep Vein Thrombosis, N/A] Comorbid History: [1:Hypertension, Received Radiation 01/15/2020] [N/A:N/A] Date Acquired: [1:15] [N/A:N/A] Weeks of Treatment: [1:Open] [N/A:N/A] Wound Status: [1:1.2x1.5x0.5] [N/A:N/A] Measurements L x W x D (cm) [1:1.414] [N/A:N/A] A (cm) : rea [1:0.707] [N/A:N/A] Volume (cm) : [1:-80.10%] [N/A:N/A] % Reduction in Area: [1:-794.90%] [N/A:N/A] % Reduction in Volume: [1:Full Thickness Without Exposed] [N/A:N/A] Classification: [1:Support Structures Medium] [N/A:N/A] Exudate Amount: [1:Serosanguineous] [N/A:N/A] Exudate Type: [1:red, brown] [N/A:N/A] Exudate Color: [1:Distinct, outline attached] [N/A:N/A] Wound Margin: [1:Large (67-100%)] [N/A:N/A] Granulation Amount: [1:Red, Pink] [N/A:N/A] Granulation Quality: [1:Small (1-33%)] [N/A:N/A] Necrotic Amount: [1:Fat Layer (Subcutaneous Tissue): Yes N/A] Exposed Structures: [1:Fascia: No Tendon: No Muscle: No Joint:  No Bone: No Small (1-33%)] [N/A:N/A] Epithelialization: [1:Compression Therapy] [N/A:N/A] Treatment Notes Electronic Signature(s) Signed: 06/07/2020 4:36:20 PM By: Linton Ham MD Signed: 06/07/2020 4:56:08 PM By: Deon Pilling Entered By: Linton Ham on 06/07/2020 13:26:58 -------------------------------------------------------------------------------- Multi-Disciplinary Care Plan Details Patient Name: Date of Service: Adler, DA [name] 06/07/2020 12:30 PM Medical Record Number: [id number] Patient Account Number: [id number] Date of Birth/Sex: Treating RN: 01-11-1946 (75 y.o. [name] Primary Care Jarris Kortz: [name] Other Clinician: Referring Zionah Criswell: Treating Sanaai Doane/Extender: Drucilla Schmidt Weeks in Treatment: 15 Active Inactive Pain, Acute or Chronic Nursing Diagnoses: Pain, acute or chronic: actual or potential Potential alteration in comfort, pain Goals: Patient will verbalize adequate pain control and receive pain control interventions during procedures as needed Date Initiated: 02/23/2020 Target Resolution Date: 07/13/2020 Goal Status: Active Patient/caregiver will verbalize comfort level met Date Initiated: 02/23/2020 Date Inactivated: 03/22/2020 Target Resolution Date: 03/23/2020 Goal Status: Met Interventions: Provide education on pain management Reposition patient for comfort Notes: Wound/Skin Impairment Nursing Diagnoses: Knowledge deficit related to ulceration/compromised skin integrity Goals: Ulcer/skin breakdown will heal within 14 weeks Date Initiated: 02/23/2020 Target Resolution Date: 07/12/2020 Goal Status: Active Interventions: Assess patient/caregiver ability to obtain necessary supplies Assess patient/caregiver ability to perform ulcer/skin care regimen upon admission and as needed Provide education on ulcer and skin care Treatment Activities: Skin care regimen initiated : 02/23/2020 Topical wound management  initiated : 02/23/2020 Notes: Electronic Signature(s) Signed: 06/07/2020 4:56:08 PM By: Deon Pilling Entered By: Deon Pilling on 06/07/2020 13:25:39 -------------------------------------------------------------------------------- Pain Assessment Details Patient Name: Date of Service: [place]o, DA [name] 06/07/2020 12:30 PM Medical Record Number: [id number] Patient Account Number: [id number] Date of Birth/Sex: Treating RN: Jan 02, 1946 (75 y.o. [name] Primary Care Bradie Sangiovanni: [name] Other Clinician: Referring Louanna Vanliew: Treating Estephan Gallardo/Extender: Drucilla Schmidt Weeks in Treatment: 15 Active Problems Location of Pain Severity and Description of Pain Patient Has Paino No Site Locations Pain Management and Medication Current Pain Management: Electronic Signature(s) Signed: 06/07/2020 2:45:22 PM By: Sandre Kitty Signed: 06/07/2020 4:56:08 PM By: Deon Pilling Entered By: Sandre Kitty on 06/07/2020 12:43:12 -------------------------------------------------------------------------------- Patient/Caregiver Education Details Patient Name: Date of Service: Ress, DA [name] 1/20/2022andnbsp12:30 PM Medical Record Number: [id number] Patient Account Number: [id number] Date of Birth/Gender: Treating RN: 01/17/1946 (75 y.o. [name] Primary Care Physician: [name] Other Clinician: Referring Physician: Treating Physician/Extender: Alycia Rossetti in Treatment: 15 Education Assessment Education Provided To: Patient Education Topics Provided Pain: Handouts: A Guide to Pain Control Methods: Explain/Verbal Responses: Reinforcements needed Electronic Signature(s) Signed: 06/07/2020 4:56:08 PM By: Deon Pilling Entered By: Deon Pilling on 06/07/2020 13:25:51 -------------------------------------------------------------------------------- Wound Assessment Details Patient Name: Date of Service: [place]bach, DA [name]  06/07/2020 12:30 PM Medical Record Number: [id number] Patient Account Number: [id number] Date  of Birth/Sex: Treating RN: 02/01/1946 (75 y.o. [name], [name] Primary Care Provider: [name] Other Clinician: Referring Provider: Treating Provider/Extender: Drucilla Schmidt Weeks in Treatment: 15 Wound Status Wound Number: 1 Primary Lymphedema Etiology: Wound Location: Right, Medial Lower Leg Wound Status: Open Wounding Event: Blister Comorbid Lymphedema, Deep Vein Thrombosis, Hypertension, Date Acquired: 01/15/2020 History: Received Radiation Weeks Of Treatment: 15 Clustered Wound: No Wound Measurements Length: (cm) 1.2 Width: (cm) 1.5 Depth: (cm) 0.5 Area: (cm) 1.414 Volume: (cm) 0.707 % Reduction in Area: -80.1% % Reduction in Volume: -794.9% Epithelialization: Small (1-33%) Tunneling: No Undermining: No Wound Description Classification: Full Thickness Without Exposed Support Structures Wound Margin: Distinct, outline attached Exudate Amount: Medium Exudate Type: Serosanguineous Exudate Color: red, brown Foul Odor After Cleansing: No Slough/Fibrino No Wound Bed Granulation Amount: Large (67-100%) Exposed Structure Granulation Quality: Red, Pink Fascia Exposed: No Necrotic Amount: Small (1-33%) Fat Layer (Subcutaneous Tissue) Exposed: Yes Necrotic Quality: Adherent Slough Tendon Exposed: No Muscle Exposed: No Joint Exposed: No Bone Exposed: No Treatment Notes Wound #1 (Lower Leg) Wound Laterality: Right, Medial Cleanser Peri-Wound Care Triamcinolone 15 (g) Discharge Instruction: Use triamcinolone 15 (g) as directed. MIXED WITH LOTION. Sween Lotion (Moisturizing lotion) Discharge Instruction: Apply moisturizing lotion as directed Topical Primary Dressing Hydrofera Blue Classic Foam, 2x2 in Discharge Instruction: Moisten with saline prior to applying to wound bed Secondary Dressing Woven Gauze Sponge, Non-Sterile 4x4 in Discharge  Instruction: Apply over primary dressing as directed. Secured With Compression Wrap FourPress (4 layer compression wrap) Discharge Instruction: Apply four layer compression as directed. APPLY UNNA BOOT FIRST LAYER TO UPPER PORTION OF LOWER LEG. Compression Stockings Add-Ons Electronic Signature(s) Signed: 06/07/2020 4:56:08 PM By: Deon Pilling Signed: 06/07/2020 4:58:08 PM By: Baruch Gouty RN, [name] Entered By: Baruch Gouty on 06/07/2020 13:11:31 -------------------------------------------------------------------------------- Vitals Details Patient Name: Date of Service: Sunde, DA [name] 06/07/2020 12:30 PM Medical Record Number: [id number] Patient Account Number: [id number] Date of Birth/Sex: Treating RN: 1945/06/07 (75 y.o. [name] Primary Care Provider: [name] Other Clinician: Referring Provider: Treating Provider/Extender: Drucilla Schmidt Weeks in Treatment: 15 Vital Signs Time Taken: 12:37 Temperature (F): 98.1 Pulse (bpm): 102 Respiratory Rate (breaths/min): 18 Blood Pressure (mmHg): 177/70 Reference Range: 80 - 120 mg / dl Electronic Signature(s) Signed: 06/07/2020 2:45:22 PM By: Sandre Kitty Entered By: Sandre Kitty on 06/07/2020 12:43:07

## 2020-06-07 NOTE — Progress Notes (Signed)
[name], [name] ([id number]) Visit Report for 06/07/2020 HPI Details Patient Name: Date of Service: [name], [name] 06/07/2020 12:30 PM Medical Record Number: [id number] Patient Account Number: [id number] Date of Birth/Sex: Treating [name]: 04/18/1946 (75 y.o. [name] Primary Care Provider: [name] Other Clinician: Referring Provider: Treating Provider/Extender: Drucilla Schmidt Weeks in Treatment: 15 History of Present Illness HPI Description: ADMISSION 02/23/2020 Patient is a 75 year old man who lives in [place] who arrives accompanied by his wife. He has a history of chronic lymphedema and venous insufficiency in his bilateral lower legs which may have something to do that with having a history of DVT as well as being treated for prostate cancer. In any case he recently got compression pumps at home but compliance has been an issue here. He has compression stockings however they are probably not sufficient enough to control swelling. They tell us that things deteriorated for him in late August he was admitted to [facility] for 7 days. This was with cellulitis I think of his bilateral lower legs. Discharge he was noted to have wounds on his bilateral lower legs. He was discharged on Bactrim. They tried to get him home health through [facility] of course they declined him. His wife is been wrapping these applying some form of silver foam dressing. He has a history of wounds before although nothing that would not heal with basic home topical dressings. He has 2 areas on the left medial, left anterior and left lateral and a smaller area on the right medial. All of these have considerable depth. Past medical history includes iron deficiency anemia, lymphedema followed by the rehab center at [facility] with lymphedema wraps I believe, DVT on chronic anticoagulation, prostate cancer, chronic venous insufficiency, hypertension. As mentioned he has  compression pumps but does not use them. ABIs in our clinic were noncompressible bilaterally 10/14; patient with severe bilateral lymphedema right greater than left. He came in with bilateral lower extremity wounds left greater than right. Even though the right side has more of the edema most of the wounds here almost closed on the right medial. He has 3 remaining wounds on the left We have been using silver alginate under 4-layer compression I have been trying to get him to be compliant with his external compression pumps 10/21; patient with 3 small wounds on the left leg and 1 on the right medial in the setting of severe lymphedema and chronic venous insufficiency. We have been using silver alginate under 4-layer compression he is using his external compression pumps twice a day 11/4; ARTERIAL STUDIES on the right show an ABI of 1.02 TBI of 0.858 with biphasic waveforms on the left 0.98 with a TBI of 0.55 and biphasic waveforms. Does not look like he has significant arterial disease. We are treating him for lymphedema he has compression pumps. He has punched-out areas on the left anterior left lateral and right medial lower extremities 11/11; after we obtained his arterial studies I put him in 4 layer compression. He is using his compression pumps probably once a day although I have asked him to do twice. Primary dressing to the wound is silver collagen he has severe lymphedema likely secondary to chronic venous insufficiency. Wounds on the left lateral, left medial and left anterior and a small area on the right medial 12/2; the area on the right anterior lower leg has healed. We initially thought that the area medially had healed as well however when  her discharge nurse came in she detected fluid in the wound simply opened up. This is actually worse than I remember this pain. The area on the left lateral potentially slightly smaller He is also complaining about pain in his left hand he says that  this is actually been getting some better he has been using topical creams on this. She asked that I look at this 12/9 after last weeks issues we have 2 wounds one on the right medial lower leg and 1 on the left lateral. Both of these are in the same condition. I think because of thickened skin secondary to chronic lymphedema these wounds actually have depth of almost 0.8 cm. 12/16; the patient has 2 small but deep wounds one on the right medial and one on the left lateral. The right medial is actually the worst of these. He arrives in clinic today with absolutely terrible edema in the right leg apparently his 4-layer wrap fell down to just above his ankle he did not think about this he is apparently been continuing to use his compression pump twice a day. The left leg looks a lot better. 05/09/2020 upon evaluation today patient appears to be doing decently well in regard to his wounds. Everything is measuring smaller the right leg still has a little bit deeper wound in the left seems to be almost completely healed in my opinion I am very pleased in general with how things are progressing. He has a 4- layer compression wrap we have been using endoform today we will probably have to use collagen just based on the fact that we do not have endoform it is on order. 1/6; the patient's wound on the left lateral lower leg has healed. Still has 1 on the right medial. 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 Using compression pumps at home twice a day. 1/13; left lateral lower leg is still healed. He has a deep punched out rectangular shaped wound on the right medial calf. Looking down at this it appears that he is attempting to epithelialize around the edges of the wound and on the base as well. His edema is reasonably well controlled we have been using collagen with absolutely no effect 1/20; left lateral lower leg remains closed he has extremitease stockings. The area on the right medial  calf I aggressively debrided last week measures larger but the surface looks better. We have been using Hydrofera Blue. We ran Oasis through his insurance but we have not seen the results of this Electronic Signature(s) Signed: 06/07/2020 4:36:20 PM By: Linton Ham MD Entered By: Linton Ham on 06/07/2020 13:27:44 -------------------------------------------------------------------------------- Physical Exam Details Patient Name: Date of Service: [name], [name] 06/07/2020 12:30 PM Medical Record Number: [id number] Patient Account Number: [id number] Date of Birth/Sex: Treating [name]: 1946/02/02 (75 y.o. [name] Primary Care Provider: [name] Other Clinician: Referring Provider: Treating Provider/Extender: Drucilla Schmidt Weeks in Treatment: 15 Constitutional Patient is hypertensive.. Pulse regular and within target range for patient.Marland Kitchen Respirations regular, non-labored and within target range.. Temperature is normal and within the target range for the patient.Marland Kitchen Appears in no distress. Cardiovascular Pedal pulses palpable. We have good edema control.. Notes Wound exam; area is a cone-shaped area now. I aggressively debrided this last week surface granulation looks healthy. No evidence of surrounding infection Electronic Signature(s) Signed: 06/07/2020 4:36:20 PM By: Linton Ham MD Entered By: Linton Ham on 06/07/2020 13:28:33 -------------------------------------------------------------------------------- Physician Orders Details Patient Name: Date of Service: [name], [name] 06/07/2020 12:30 PM Medical Record Number: [id number] Patient Account Number: [id number] Date of Birth/Sex: Treating [name]: [birth year] (75 y.o. [name] Primary Care Provider: [name] Other Clinician: Referring Provider: Treating Provider/Extender: Drucilla Schmidt Weeks in Treatment: 15 Verbal / Phone Orders: No Diagnosis Coding ICD-10  Coding Code Description I89.0 Lymphedema, not elsewhere classified L97.812 Non-pressure chronic ulcer of other part of right lower leg with fat layer exposed Follow-up Appointments Return Appointment in 1 week. Bathing/ Shower/ Hygiene May shower with protection but do not get wound dressing(s) wet. - use a cast protector. Edema Control - Lymphedema / SCD / Other Bilateral Lower Extremities Lymphedema Pumps. Use Lymphedema pumps on leg(s) 2-3 times a day for 45-60 minutes. If wearing any wraps or hose, do not remove them. Continue exercising as instructed. - 3 times a day. Elevate legs to the level of the heart or above for 30 minutes daily and/or when sitting, a frequency of: - 3-4 times Avoid standing for long periods of time. Exercise regularly Compression stocking or Garment 30-40 mm/Hg pressure to: - patient to apply extremit-ease to left leg in the morning and remove at night. patient to apply lotion to left leg every night after removing the compression garment. Wound Treatment Wound #1 - Lower Leg Wound Laterality: Right, Medial Peri-Wound Care: Triamcinolone 15 (g) 1 x Per Week Discharge Instructions: Use triamcinolone 15 (g) as directed. MIXED WITH LOTION. Peri-Wound Care: Sween Lotion (Moisturizing lotion) 1 x Per Week Discharge Instructions: Apply moisturizing lotion as directed Prim Dressing: Hydrofera Blue Classic Foam, 2x2 in 1 x Per Week ary Discharge Instructions: Moisten with saline prior to applying to wound bed Secondary Dressing: Woven Gauze Sponge, Non-Sterile 4x4 in 1 x Per Week Discharge Instructions: Apply over primary dressing as directed. Compression Wrap: FourPress (4 layer compression wrap) 1 x Per Week Discharge Instructions: Apply four layer compression as directed. APPLY UNNA BOOT FIRST LAYER TO UPPER PORTION OF LOWER LEG. Electronic Signature(s) Signed: 06/07/2020 4:36:20 PM By: Linton Ham MD Signed: 06/07/2020 4:56:08 PM By: Deon Pilling Entered  By: Deon Pilling on 06/07/2020 13:25:29 -------------------------------------------------------------------------------- Problem List Details Patient Name: Date of Service: [name], [name] 06/07/2020 12:30 PM Medical Record Number: [id number] Patient Account Number: [id number] Date of Birth/Sex: Treating [name]: 1946/05/05 (75 y.o. [name] Primary Care Provider: [name] Other Clinician: Referring Provider: Treating Provider/Extender: Drucilla Schmidt Weeks in Treatment: 15 Active Problems ICD-10 Encounter Code Description Active Date MDM Diagnosis I89.0 Lymphedema, not elsewhere classified 02/23/2020 No Yes L97.812 Non-pressure chronic ulcer of other part of right lower leg with fat layer 02/23/2020 No Yes exposed Inactive Problems ICD-10 Code Description Active Date Inactive Date L97.822 Non-pressure chronic ulcer of other part of left lower leg with fat layer exposed 02/23/2020 02/23/2020 Resolved Problems Electronic Signature(s) Signed: 06/07/2020 4:36:20 PM By: Linton Ham MD Entered By: Linton Ham on 06/07/2020 13:26:45 -------------------------------------------------------------------------------- Progress Note Details Patient Name: Date of Service: [name], [name] 06/07/2020 12:30 PM Medical Record Number: [id number] Patient Account Number: [id number] Date of Birth/Sex: Treating [name]: May 10, 1946 (75 y.o. [name] Primary Care Provider: [name] Other Clinician: Referring Provider: Treating Provider/Extender: Drucilla Schmidt Weeks in Treatment: 15 Subjective History of Present Illness (HPI) ADMISSION 02/23/2020 Patient is a 75 year old man who lives in [place] who arrives accompanied by his wife. He has a history of chronic lymphedema and venous insufficiency in his bilateral lower legs which may have something to do that with having  a history of DVT as well as being treated for prostate cancer. In  any case he recently got compression pumps at home but compliance has been an issue here. He has compression stockings however they are probably not sufficient enough to control swelling. They tell us that things deteriorated for him in late August he was admitted to [facility] for 7 days. This was with cellulitis I think of his bilateral lower legs. Discharge he was noted to have wounds on his bilateral lower legs. He was discharged on Bactrim. They tried to get him home health through [facility] of course they declined him. His wife is been wrapping these applying some form of silver foam dressing. He has a history of wounds before although nothing that would not heal with basic home topical dressings. He has 2 areas on the left medial, left anterior and left lateral and a smaller area on the right medial. All of these have considerable depth. Past medical history includes iron deficiency anemia, lymphedema followed by the rehab center at [facility] with lymphedema wraps I believe, DVT on chronic anticoagulation, prostate cancer, chronic venous insufficiency, hypertension. As mentioned he has compression pumps but does not use them. ABIs in our clinic were noncompressible bilaterally 10/14; patient with severe bilateral lymphedema right greater than left. He came in with bilateral lower extremity wounds left greater than right. Even though the right side has more of the edema most of the wounds here almost closed on the right medial. He has 3 remaining wounds on the left We have been using silver alginate under 4-layer compression I have been trying to get him to be compliant with his external compression pumps 10/21; patient with 3 small wounds on the left leg and 1 on the right medial in the setting of severe lymphedema and chronic venous insufficiency. We have been using silver alginate under 4-layer compression he is using his external compression pumps twice a  day 11/4; ARTERIAL STUDIES on the right show an ABI of 1.02 TBI of 0.858 with biphasic waveforms on the left 0.98 with a TBI of 0.55 and biphasic waveforms. Does not look like he has significant arterial disease. We are treating him for lymphedema he has compression pumps. He has punched-out areas on the left anterior left lateral and right medial lower extremities 11/11; after we obtained his arterial studies I put him in 4 layer compression. He is using his compression pumps probably once a day although I have asked him to do twice. Primary dressing to the wound is silver collagen he has severe lymphedema likely secondary to chronic venous insufficiency. Wounds on the left lateral, left medial and left anterior and a small area on the right medial 12/2; the area on the right anterior lower leg has healed. We initially thought that the area medially had healed as well however when her discharge nurse came in she detected fluid in the wound simply opened up. This is actually worse than I remember this pain. The area on the left lateral potentially slightly smaller He is also complaining about pain in his left hand he says that this is actually been getting some better he has been using topical creams on this. She asked that I look at this 12/9 after last weeks issues we have 2 wounds one on the right medial lower leg and 1 on the left lateral. Both of these are in the same condition. I think because of thickened skin secondary to chronic  lymphedema these wounds actually have depth of almost 0.8 cm. 12/16; the patient has 2 small but deep wounds one on the right medial and one on the left lateral. The right medial is actually the worst of these. He arrives in clinic today with absolutely terrible edema in the right leg apparently his 4-layer wrap fell down to just above his ankle he did not think about this he is apparently been continuing to use his compression pump twice a day. The left leg looks a  lot better. 05/09/2020 upon evaluation today patient appears to be doing decently well in regard to his wounds. Everything is measuring smaller the right leg still has a little bit deeper wound in the left seems to be almost completely healed in my opinion I am very pleased in general with how things are progressing. He has a 4- layer compression wrap we have been using endoform today we will probably have to use collagen just based on the fact that we do not have endoform it is on order. 1/6; the patient's wound on the left lateral lower leg has healed. Still has 1 on the right medial. 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 Using compression pumps at home twice a day. 1/13; left lateral lower leg is still healed. He has a deep punched out rectangular shaped wound on the right medial calf. Looking down at this it appears that he is attempting to epithelialize around the edges of the wound and on the base as well. His edema is reasonably well controlled we have been using collagen with absolutely no effect 1/20; left lateral lower leg remains closed he has extremitease stockings. The area on the right medial calf I aggressively debrided last week measures larger but the surface looks better. We have been using Hydrofera Blue. We ran Oasis through his insurance but we have not seen the results of this Objective Constitutional Patient is hypertensive.. Pulse regular and within target range for patient.Marland Kitchen Respirations regular, non-labored and within target range.. Temperature is normal and within the target range for the patient.Marland Kitchen Appears in no distress. Vitals Time Taken: 12:37 PM, Temperature: 98.1 F, Pulse: 102 bpm, Respiratory Rate: 18 breaths/min, Blood Pressure: 177/70 mmHg. Cardiovascular Pedal pulses palpable. We have good edema control.. General Notes: Wound exam; area is a cone-shaped area now. I aggressively debrided this last week surface granulation looks healthy. No  evidence of surrounding infection Integumentary (Hair, Skin) Wound #1 status is Open. Original cause of wound was Blister. The wound is located on the Right,Medial Lower Leg. The wound measures 1.2cm length x 1.5cm width x 0.5cm depth; 1.414cm^2 area and 0.707cm^3 volume. There is Fat Layer (Subcutaneous Tissue) exposed. There is no tunneling or undermining noted. There is a medium amount of serosanguineous drainage noted. The wound margin is distinct with the outline attached to the wound base. There is large (67-100%) red, pink granulation within the wound bed. There is a small (1-33%) amount of necrotic tissue within the wound bed including Adherent Slough. Assessment Active Problems ICD-10 Lymphedema, not elsewhere classified Non-pressure chronic ulcer of other part of right lower leg with fat layer exposed Procedures Wound #1 Pre-procedure diagnosis of Wound #1 is a Lymphedema located on the Right,Medial Lower Leg . There was a Four Layer Compression Therapy Procedure by [name] Gouty, [name]. Post procedure Diagnosis Wound #1: Same as Pre-Procedure Plan Follow-up Appointments: Return Appointment in 1 week. Bathing/ Shower/ Hygiene: May shower with protection but do not get wound dressing(s) wet. -  use a cast protector. Edema Control - Lymphedema / SCD / Other: Lymphedema Pumps. Use Lymphedema pumps on leg(s) 2-3 times a day for 45-60 minutes. If wearing any wraps or hose, do not remove them. Continue exercising as instructed. - 3 times a day. Elevate legs to the level of the heart or above for 30 minutes daily and/or when sitting, a frequency of: - 3-4 times Avoid standing for long periods of time. Exercise regularly Compression stocking or Garment 30-40 mm/Hg pressure to: - patient to apply extremit-ease to left leg in the morning and remove at night. patient to apply lotion to left leg every night after removing the compression garment. WOUND #1: - Lower Leg Wound Laterality:  Right, Medial Peri-Wound Care: Triamcinolone 15 (g) 1 x Per Week/ Discharge Instructions: Use triamcinolone 15 (g) as directed. MIXED WITH LOTION. Peri-Wound Care: Sween Lotion (Moisturizing lotion) 1 x Per Week/ Discharge Instructions: Apply moisturizing lotion as directed Prim Dressing: Hydrofera Blue Classic Foam, 2x2 in 1 x Per Week/ ary Discharge Instructions: Moisten with saline prior to applying to wound bed Secondary Dressing: Woven Gauze Sponge, Non-Sterile 4x4 in 1 x Per Week/ Discharge Instructions: Apply over primary dressing as directed. Com pression Wrap: FourPress (4 layer compression wrap) 1 x Per Week/ Discharge Instructions: Apply four layer compression as directed. APPLY UNNA BOOT FIRST LAYER TO UPPER PORTION OF LOWER LEG. 1. Continue with Hydrofera Blue under 4-layer compression 2. Looking forward to response about Oasis to try and get this small area to fill-in Electronic Signature(s) Signed: 06/07/2020 4:36:20 PM By: Linton Ham MD Entered By: Linton Ham on 06/07/2020 13:29:18 -------------------------------------------------------------------------------- SuperBill Details Patient Name: Date of Service: Karman, [name] 06/07/2020 Medical Record Number: [id number] Patient Account Number: [id number] Date of Birth/Sex: Treating [name]: 11-14-1945 (75 y.o. [name] Primary Care Provider: [name] Other Clinician: Referring Provider: Treating Provider/Extender: Drucilla Schmidt Weeks in Treatment: 15 Diagnosis Coding ICD-10 Codes Code Description I89.0 Lymphedema, not elsewhere classified B44.967 Non-pressure chronic ulcer of other part of right lower leg with fat layer exposed Facility Procedures CPT4 Code: [id number] Description: (Facility Use Only) [phone] - [place] [facility] RT LEG Modifier: Quantity: 1 Physician Procedures : CPT4 Code Description Modifier [id number] - WC PHYS LEVEL 3 - EST PT ICD-10 Diagnosis  Description I89.0 Lymphedema, not elsewhere classified Q33.007 Non-pressure chronic ulcer of other part of right lower leg with fat layer exposed Quantity: 1 Electronic Signature(s) Signed: 06/07/2020 4:36:20 PM By: Linton Ham MD Entered By: Linton Ham on 06/07/2020 13:29:37

## 2020-06-14 ENCOUNTER — Other Ambulatory Visit: Payer: Self-pay

## 2020-06-14 ENCOUNTER — Encounter (HOSPITAL_BASED_OUTPATIENT_CLINIC_OR_DEPARTMENT_OTHER): Payer: Medicare Other | Admitting: Internal Medicine

## 2020-06-14 DIAGNOSIS — I87331 Chronic venous hypertension (idiopathic) with ulcer and inflammation of right lower extremity: Secondary | ICD-10-CM | POA: Diagnosis not present

## 2020-06-14 NOTE — Progress Notes (Signed)
Grosso, CHANDAN ([id number]) Visit Report for 06/14/2020 Arrival Information Details Patient Name: Date of Service: [name], [name] 06/14/2020 2:15 PM Medical Record Number: [id number] Patient Account Number: [id number] Date of Birth/Sex: Treating RN: 06-14-1945 (75 y.o. [name] Primary Care Provider: [name] Other Clinician: Referring Provider: Treating Provider/Extender: Drucilla Schmidt Weeks in Treatment: 16 Visit Information History Since Last Visit Added or deleted any medications: No Patient Arrived: Ambulatory Any new allergies or adverse reactions: No Arrival Time: 14:59 Had a fall or experienced change in No Accompanied By: self activities of daily living that may affect Transfer Assistance: None risk of falls: Patient Identification Verified: Yes Signs or symptoms of abuse/neglect since last visito No Secondary Verification Process Completed: Yes Hospitalized since last visit: No Patient Requires Transmission-Based Precautions: No Implantable device outside of the clinic excluding No Patient Has Alerts: Yes cellular tissue based products placed in the center Patient Alerts: Patient on Blood Thinner since last visit: on warfarin Has Dressing in Place as Prescribed: Yes Bilateral ABI:  Has Compression in Place as Prescribed: Yes Pain Present Now: No Electronic Signature(s) Signed: 06/14/2020 5:45:53 PM By: Baruch Gouty RN, [name] Entered By: Baruch Gouty on 06/14/2020 14:59:39 -------------------------------------------------------------------------------- Compression Therapy Details Patient Name: Date of Service: [name], [name] 06/14/2020 2:15 PM Medical Record Number: [id number] Patient Account Number: [id number] Date of Birth/Sex: Treating RN: [birth year] (74 y.o. [name] Primary Care Provider: [name] Other Clinician: Referring Provider: Treating Provider/Extender: Drucilla Schmidt Weeks  in Treatment: 16 Compression Therapy Performed for Wound Assessment: Wound #1 Right,Medial Lower Leg Performed By: Clinician Baruch Gouty, RN Compression Type: Four Layer Post Procedure Diagnosis Same as Pre-procedure Electronic Signature(s) Signed: 06/14/2020 5:42:01 PM By: Deon Pilling Entered By: Deon Pilling on 06/14/2020 15:40:20 -------------------------------------------------------------------------------- Encounter Discharge Information Details Patient Name: Date of Service: [name], [name] 06/14/2020 2:15 PM Medical Record Number: [id number] Patient Account Number: [id number] Date of Birth/Sex: Treating RN: 1946-02-21 (75 y.o. [name] Primary Care Provider: [name] Other Clinician: Referring Provider: Treating Provider/Extender: Alycia Rossetti in Treatment: 16 Encounter Discharge Information Items Discharge Condition: Stable Ambulatory Status: Ambulatory Discharge Destination: Home Transportation: Private Auto Accompanied By: self Schedule Follow-up Appointment: Yes Clinical Summary of Care: Patient Declined Electronic Signature(s) Signed: 06/14/2020 5:45:53 PM By: Baruch Gouty RN, [name] Entered By: Baruch Gouty on 06/14/2020 16:50:02 -------------------------------------------------------------------------------- Lower Extremity Assessment Details Patient Name: Date of Service: [name], [name] 06/14/2020 2:15 PM Medical Record Number: [id number] Patient Account Number: [id number] Date of Birth/Sex: Treating RN: [birth year] (74 y.o. [name] Primary Care Provider: [name] Other Clinician: Referring Provider: Treating Provider/Extender: Drucilla Schmidt Weeks in Treatment: 16 Edema Assessment Assessed: [name]: No] [name]: No] Edema: [Left: Ye] [Right: s] Calf Left: Right: Point of Measurement: 46 cm From Medial Instep 41.1 cm Ankle Left: Right: Point of Measurement: 15 cm  From Medial Instep 33.5 cm Vascular Assessment Pulses: Dorsalis Pedis Palpable: [Right:Yes] Electronic Signature(s) Signed: 06/14/2020 5:45:53 PM By: Baruch Gouty RN, [name] Entered By: Baruch Gouty on 06/14/2020 15:04:03 -------------------------------------------------------------------------------- Multi Wound Chart Details Patient Name: Date of Service: [name], [name] 06/14/2020 2:15 PM Medical Record Number: [id number] Patient Account Number: [id number] Date of Birth/Sex: Treating RN: 10/22/1945 (74 y.o. [name] Primary Care Provider: [name] Other Clinician: Referring Provider: Treating Provider/Extender: Drucilla Schmidt Weeks in Treatment: 16 Vital Signs Height(in): 74 Pulse(bpm): 108 Weight(lbs): 240 Blood Pressure(mmHg): 194/76 Body Mass Index(BMI):  31 Temperature(F): 98.1 Respiratory Rate(breaths/min): 18 Photos: [1:No Photos Right, Medial Lower Leg] [N/A:N/A N/A] Wound Location: [1:Blister] [N/A:N/A] Wounding Event: [1:Lymphedema] [N/A:N/A] Primary Etiology: [1:Lymphedema, Deep Vein Thrombosis, N/A] Comorbid History: [1:Hypertension, Received Radiation 01/15/2020] [N/A:N/A] Date Acquired: [1:16] [N/A:N/A] Weeks of Treatment: [1:Open] [N/A:N/A] Wound Status: [1:0.7x0.8x0.5] [N/A:N/A] Measurements L x W x D (cm) [1:0.44] [N/A:N/A] A (cm) : rea [1:0.22] [N/A:N/A] Volume (cm) : [1:43.90%] [N/A:N/A] % Reduction in Area: [1:-178.50%] [N/A:N/A] % Reduction in Volume: [1:Full Thickness Without Exposed] [N/A:N/A] Classification: [1:Support Structures Medium] [N/A:N/A] Exudate Amount: [1:Serosanguineous] [N/A:N/A] Exudate Type: [1:red, brown] [N/A:N/A] Exudate Color: [1:Distinct, outline attached] [N/A:N/A] Wound Margin: [1:Large (67-100%)] [N/A:N/A] Granulation Amount: [1:Red, Pink] [N/A:N/A] Granulation Quality: [1:Small (1-33%)] [N/A:N/A] Necrotic Amount: [1:Fat Layer (Subcutaneous Tissue): Yes N/A] Exposed  Structures: [1:Fascia: No Tendon: No Muscle: No Joint: No Bone: No Small (1-33%)] [N/A:N/A] Epithelialization: [1:Compression Therapy] [N/A:N/A] Treatment Notes Electronic Signature(s) Signed: 06/14/2020 4:56:03 PM By: Linton Ham MD Signed: 06/14/2020 5:42:01 PM By: Deon Pilling Entered By: Linton Ham on 06/14/2020 15:56:36 -------------------------------------------------------------------------------- Multi-Disciplinary Care Plan Details Patient Name: Date of Service: [name], [name] 06/14/2020 2:15 PM Medical Record Number: [id number] Patient Account Number: [id number] Date of Birth/Sex: Treating RN: 21-Oct-1945 (75 y.o. [name] Primary Care Leolia Vinzant: [name] Other Clinician: Referring Taysen Bushart: Treating Laiden Milles/Extender: Drucilla Schmidt Weeks in Treatment: 16 Active Inactive Pain, Acute or Chronic Nursing Diagnoses: Pain, acute or chronic: actual or potential Potential alteration in comfort, pain Goals: Patient will verbalize adequate pain control and receive pain control interventions during procedures as needed Date Initiated: 02/23/2020 Target Resolution Date: 07/13/2020 Goal Status: Active Patient/caregiver will verbalize comfort level met Date Initiated: 02/23/2020 Date Inactivated: 03/22/2020 Target Resolution Date: 03/23/2020 Goal Status: Met Interventions: Provide education on pain management Reposition patient for comfort Notes: Wound/Skin Impairment Nursing Diagnoses: Knowledge deficit related to ulceration/compromised skin integrity Goals: Ulcer/skin breakdown will heal within 14 weeks Date Initiated: 02/23/2020 Target Resolution Date: 07/12/2020 Goal Status: Active Interventions: Assess patient/caregiver ability to obtain necessary supplies Assess patient/caregiver ability to perform ulcer/skin care regimen upon admission and as needed Provide education on ulcer and skin care Treatment Activities: Skin care  regimen initiated : 02/23/2020 Topical wound management initiated : 02/23/2020 Notes: Electronic Signature(s) Signed: 06/14/2020 5:42:01 PM By: Deon Pilling Entered By: Deon Pilling on 06/14/2020 15:39:58 -------------------------------------------------------------------------------- Pain Assessment Details Patient Name: Date of Service: [name], [name] 06/14/2020 2:15 PM Medical Record Number: [id number] Patient Account Number: [id number] Date of Birth/Sex: Treating RN: 08-Sep-1945 (75 y.o. [name] Primary Care Javier Mamone: [name] Other Clinician: Referring Kariel Skillman: Treating Staisha Winiarski/Extender: Drucilla Schmidt Weeks in Treatment: 16 Active Problems Location of Pain Severity and Description of Pain Patient Has Paino No Site Locations Rate the pain. Rate the pain. Current Pain Level: 0 Pain Management and Medication Current Pain Management: Electronic Signature(s) Signed: 06/14/2020 5:45:53 PM By: Baruch Gouty RN, [name] Entered By: Baruch Gouty on 06/14/2020 15:01:05 -------------------------------------------------------------------------------- Patient/Caregiver Education Details Patient Name: Date of Service: Braid, [name] 1/27/2022andnbsp2:15 PM Medical Record Number: [id number] Patient Account Number: [id number] Date of Birth/Gender: Treating RN: 18-Feb-1946 (75 y.o. [name] Primary Care Physician: [name] Other Clinician: Referring Physician: Treating Physician/Extender: Alycia Rossetti in Treatment: 16 Education Assessment Education Provided To: Patient Education Topics Provided Pain: Handouts: A Guide to Pain Control Methods: Explain/Verbal Responses: Reinforcements needed Electronic Signature(s) Signed: 06/14/2020 5:42:01 PM By: Deon Pilling Entered By: Deon Pilling on 06/14/2020 15:40:50 -------------------------------------------------------------------------------- Wound  Assessment Details Patient Name: Date  of Service: [name], [name] 06/14/2020 2:15 PM Medical Record Number: [id number] Patient Account Number: [id number] Date of Birth/Sex: Treating RN: August 12, 1945 (75 y.o. [name] Primary Care Provider: [name] Other Clinician: Referring Provider: Treating Provider/Extender: Drucilla Schmidt Weeks in Treatment: 16 Wound Status Wound Number: 1 Primary Lymphedema Etiology: Wound Location: Right, Medial Lower Leg Wound Status: Open Wounding Event: Blister Comorbid Lymphedema, Deep Vein Thrombosis, Hypertension, Date Acquired: 01/15/2020 History: Received Radiation Weeks Of Treatment: 16 Clustered Wound: No Wound Measurements Length: (cm) 0.7 Width: (cm) 0.8 Depth: (cm) 0.5 Area: (cm) 0.44 Volume: (cm) 0.22 % Reduction in Area: 43.9% % Reduction in Volume: -178.5% Epithelialization: Small (1-33%) Tunneling: No Undermining: No Wound Description Classification: Full Thickness Without Exposed Support Structures Wound Margin: Distinct, outline attached Exudate Amount: Medium Exudate Type: Serosanguineous Exudate Color: red, brown Foul Odor After Cleansing: No Slough/Fibrino No Wound Bed Granulation Amount: Large (67-100%) Exposed Structure Granulation Quality: Red, Pink Fascia Exposed: No Necrotic Amount: Small (1-33%) Fat Layer (Subcutaneous Tissue) Exposed: Yes Necrotic Quality: Adherent Slough Tendon Exposed: No Muscle Exposed: No Joint Exposed: No Bone Exposed: No Treatment Notes Wound #1 (Lower Leg) Wound Laterality: Right, Medial Cleanser Peri-Wound Care Triamcinolone 15 (g) Discharge Instruction: Use triamcinolone 15 (g) as directed. MIXED WITH LOTION. Sween Lotion (Moisturizing lotion) Discharge Instruction: Apply moisturizing lotion as directed Topical Primary Dressing Hydrofera Blue Classic Foam, 2x2 in Discharge Instruction: Moisten with saline prior to applying to wound  bed Secondary Dressing Woven Gauze Sponge, Non-Sterile 4x4 in Discharge Instruction: Apply over primary dressing as directed. Secured With Compression Wrap FourPress (4 layer compression wrap) Discharge Instruction: Apply four layer compression as directed. APPLY UNNA BOOT FIRST LAYER TO UPPER PORTION OF LOWER LEG. Compression Stockings Add-Ons Electronic Signature(s) Signed: 06/14/2020 5:45:53 PM By: Baruch Gouty RN, [name] Entered By: Baruch Gouty on 06/14/2020 15:05:34 -------------------------------------------------------------------------------- [place] Details Patient Name: Date of Service: [name], [name] 06/14/2020 2:15 PM Medical Record Number: [id number] Patient Account Number: [id number] Date of Birth/Sex: Treating RN: 20-Feb-1946 (75 y.o. [name] Primary Care Provider: [name] Other Clinician: Referring Provider: Treating Provider/Extender: Drucilla Schmidt Weeks in Treatment: 16 Vital Signs Time Taken: 14:59 Temperature (F): 98.1 Height (in): 74 Pulse (bpm): 108 Source: Stated Respiratory Rate (breaths/min): 18 Weight (lbs): 240 Blood Pressure (mmHg): 194/76 Source: Stated Reference Range: 80 - 120 mg / dl Body Mass Index (BMI): 30.8 Electronic Signature(s) Signed: 06/14/2020 5:45:53 PM By: Baruch Gouty RN, [name] Entered By: Baruch Gouty on 06/14/2020 15:00:55

## 2020-06-14 NOTE — Progress Notes (Signed)
[name], [name] ([id number]) Visit Report for 06/14/2020 HPI Details Patient Name: Date of Service: [name], [name] 06/14/2020 2:15 PM Medical Record Number: [id number] Patient Account Number: [id number] Date of Birth/Sex: Treating RN: 01-04-1946 (75 y.o. [name] Primary Care Provider: Birdie [name] Other Clinician: Referring Provider: Treating Provider/Extender: Drucilla Schmidt Weeks in Treatment: 16 History of Present Illness HPI Description: ADMISSION 02/23/2020 Patient is a 75 year old man who lives in [place] who arrives accompanied by his wife. He has a history of chronic lymphedema and venous insufficiency in his bilateral lower legs which may have something to do that with having a history of DVT as well as being treated for prostate cancer. In any case he recently got compression pumps at home but compliance has been an issue here. He has compression stockings however they are probably not sufficient enough to control swelling. They tell us that things deteriorated for him in late August he was admitted to [facility] for 7 days. This was with cellulitis I think of his bilateral lower legs. Discharge he was noted to have wounds on his bilateral lower legs. He was discharged on Bactrim. They tried to get him home health through [facility] part [facility] of course they declined him. His wife is been wrapping these applying some form of silver foam dressing. He has a history of wounds before although nothing that would not heal with basic home topical dressings. He has 2 areas on the left medial, left anterior and left lateral and a smaller area on the right medial. All of these have considerable depth. Past medical history includes iron deficiency anemia, lymphedema followed by the rehab center at [facility] with lymphedema wraps I believe, DVT on chronic anticoagulation, prostate cancer, chronic venous insufficiency, hypertension. As mentioned he has  compression pumps but does not use them. ABIs in our clinic were noncompressible bilaterally 10/14; patient with severe bilateral lymphedema right greater than left. He came in with bilateral lower extremity wounds left greater than right. Even though the right side has more of the edema most of the wounds here almost closed on the right medial. He has 3 remaining wounds on the left We have been using silver alginate under 4-layer compression I have been trying to get him to be compliant with his external compression pumps 10/21; patient with 3 small wounds on the left leg and 1 on the right medial in the setting of severe lymphedema and chronic venous insufficiency. We have been using silver alginate under 4-layer compression he is using his external compression pumps twice a day 11/4; ARTERIAL STUDIES on the right show an ABI of 1.02 TBI of 0.858 with biphasic waveforms on the left 0.98 with a TBI of 0.55 and biphasic waveforms. Does not look like he has significant arterial disease. We are treating him for lymphedema he has compression pumps. He has punched-out areas on the left anterior left lateral and right medial lower extremities 11/11; after we obtained his arterial studies I put him in 4 layer compression. He is using his compression pumps probably once a day although I have asked him to do twice. Primary dressing to the wound is silver collagen he has severe lymphedema likely secondary to chronic venous insufficiency. Wounds on the left lateral, left medial and left anterior and a small area on the right medial 12/2; the area on the right anterior lower leg has healed. We initially thought that the area medially had healed as well however when  her discharge nurse came in she detected fluid in the wound simply opened up. This is actually worse than I remember this pain. The area on the left lateral potentially slightly smaller He is also complaining about pain in his left hand he says that  this is actually been getting some better he has been using topical creams on this. She asked that I look at this 12/9 after last weeks issues we have 2 wounds one on the right medial lower leg and 1 on the left lateral. Both of these are in the same condition. I think because of thickened skin secondary to chronic lymphedema these wounds actually have depth of almost 0.8 cm. 12/16; the patient has 2 small but deep wounds one on the right medial and one on the left lateral. The right medial is actually the worst of these. He arrives in clinic today with absolutely terrible edema in the right leg apparently his 4-layer wrap fell down to just above his ankle he did not think about this he is apparently been continuing to use his compression pump twice a day. The left leg looks a lot better. 05/09/2020 upon evaluation today patient appears to be doing decently well in regard to his wounds. Everything is measuring smaller the right leg still has a little bit deeper wound in the left seems to be almost completely healed in my opinion I am very pleased in general with how things are progressing. He has a 4- layer compression wrap we have been using endoform today we will probably have to use collagen just based on the fact that we do not have endoform it is on order. 1/6; the patient's wound on the left lateral lower leg has healed. Still has 1 on the right medial. 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 Using compression pumps at home twice a day. 1/13; left lateral lower leg is still healed. He has a deep punched out rectangular shaped wound on the right medial calf. Looking down at this it appears that he is attempting to epithelialize around the edges of the wound and on the base as well. His edema is reasonably well controlled we have been using collagen with absolutely no effect 1/20; left lateral lower leg remains closed he has extremitease stockings. The area on the right medial  calf I aggressively debrided last week measures larger but the surface looks better. We have been using Hydrofera Blue. We ran Oasis through his insurance but we have not seen the results of this 1/27; left lower leg wound with chronic venous insufficiency and secondary lymphedema. I did aggressive debridement on this last week the wound seems to have come in healthy looking surface using Hydrofera Blue. He was denied for [employer]) Signed: 06/14/2020 4:56:03 PM By: Linton Ham MD Entered By: Linton Ham on 06/14/2020 15:57:17 -------------------------------------------------------------------------------- Physical Exam Details Patient Name: Date of Service: [name], [name] 06/14/2020 2:15 PM Medical Record Number: [id number] Patient Account Number: [id number] Date of Birth/Sex: Treating RN: 31-Aug-1945 (75 y.o. [name] Primary Care Provider: Birdie [name] Other Clinician: Referring Provider: Treating Provider/Extender: Drucilla Schmidt Weeks in Treatment: 36 Constitutional Patient is hypertensive.. Pulse regular and within target range for patient.Marland Kitchen Respirations regular, non-labored and within target range.. Temperature is normal and within the target range for the patient.Marland Kitchen Appears in no distress. Cardiovascular Needle pulses are palpable. As usual mediocre edema on the right lower leg. Notes Wound exam; area appears smaller. Base of the  wound looks healthy. No debridement was necessary. Electronic Signature(s) Signed: 06/14/2020 4:56:03 PM By: Linton Ham MD Entered By: Linton Ham on 06/14/2020 15:58:17 -------------------------------------------------------------------------------- Physician Orders Details Patient Name: Date of Service: [place], [place] 06/14/2020 2:15 PM Medical Record Number: [id number] Patient Account Number: [id number] Date of Birth/Sex: Treating RN: [birth year] (75 y.o. [name] Primary Care  Provider: Birdie [name] Other Clinician: Referring Provider: Treating Provider/Extender: Drucilla Schmidt Weeks in Treatment: 86 Verbal / Phone Orders: No Diagnosis Coding Follow-up Appointments Return Appointment in 1 week. Bathing/ Shower/ Hygiene May shower with protection but do not get wound dressing(s) wet. - use a cast protector. Edema Control - Lymphedema / SCD / Other Bilateral Lower Extremities Lymphedema Pumps. Use Lymphedema pumps on leg(s) 2-3 times a day for 45-60 minutes. If wearing any wraps or hose, do not remove them. Continue exercising as instructed. - 3 times a day. Elevate legs to the level of the heart or above for 30 minutes daily and/or when sitting, a frequency of: - 3-4 times Avoid standing for long periods of time. Exercise regularly Compression stocking or Garment 30-40 mm/Hg pressure to: - patient to apply extremit-ease to left leg in the morning and remove at night. patient to apply lotion to left leg every night after removing the compression garment. Wound Treatment Wound #1 - Lower Leg Wound Laterality: Right, Medial Peri-Wound Care: Triamcinolone 15 (g) 1 x Per Week Discharge Instructions: Use triamcinolone 15 (g) as directed. MIXED WITH LOTION. Peri-Wound Care: Sween Lotion (Moisturizing lotion) 1 x Per Week Discharge Instructions: Apply moisturizing lotion as directed Prim Dressing: Hydrofera Blue Classic Foam, 2x2 in 1 x Per Week ary Discharge Instructions: Moisten with saline prior to applying to wound bed Secondary Dressing: Woven Gauze Sponge, Non-Sterile 4x4 in 1 x Per Week Discharge Instructions: Apply over primary dressing as directed. Compression Wrap: FourPress (4 layer compression wrap) 1 x Per Week Discharge Instructions: Apply four layer compression as directed. APPLY UNNA BOOT FIRST LAYER TO UPPER PORTION OF LOWER LEG. Electronic Signature(s) Signed: 06/14/2020 4:56:03 PM By: Linton Ham MD Signed: 06/14/2020  5:42:01 PM By: Deon Pilling Entered By: Deon Pilling on 06/14/2020 15:40:38 -------------------------------------------------------------------------------- Problem List Details Patient Name: Date of Service: [place], [place] 06/14/2020 2:15 PM Medical Record Number: [id number] Patient Account Number: [id number] Date of Birth/Sex: Treating RN: 1946/01/20 (75 y.o. [name] Primary Care Provider: Birdie [name] Other Clinician: Referring Provider: Treating Provider/Extender: Drucilla Schmidt Weeks in Treatment: 16 Active Problems ICD-10 Encounter Code Description Active Date MDM Diagnosis I89.0 Lymphedema, not elsewhere classified 02/23/2020 No Yes L97.812 Non-pressure chronic ulcer of other part of right lower leg with fat layer 02/23/2020 No Yes exposed I87.331 Chronic venous hypertension (idiopathic) with ulcer and inflammation of right 06/14/2020 No Yes lower extremity Inactive Problems ICD-10 Code Description Active Date Inactive Date L97.822 Non-pressure chronic ulcer of other part of left lower leg with fat layer exposed 02/23/2020 02/23/2020 Resolved Problems Electronic Signature(s) Signed: 06/14/2020 4:56:03 PM By: Linton Ham MD Entered By: Linton Ham on 06/14/2020 15:55:46 -------------------------------------------------------------------------------- Progress Note Details Patient Name: Date of Service: [place], [place] 06/14/2020 2:15 PM Medical Record Number: [id number] Patient Account Number: [id number] Date of Birth/Sex: Treating RN: [birth year] (75 y.o. [name] Primary Care Provider: Birdie [name] Other Clinician: Referring Provider: Treating Provider/Extender: Drucilla Schmidt Weeks in Treatment: 16 Subjective History of Present Illness (HPI) ADMISSION 02/23/2020 Patient is a 75 year old man who lives in [place] who arrives accompanied  by his wife. He has a history of chronic lymphedema and venous  insufficiency in his bilateral lower legs which may have something to do that with having a history of DVT as well as being treated for prostate cancer. In any case he recently got compression pumps at home but compliance has been an issue here. He has compression stockings however they are probably not sufficient enough to control swelling. They tell us that things deteriorated for him in late August he was admitted to [facility] for 7 days. This was with cellulitis I think of his bilateral lower legs. Discharge he was noted to have wounds on his bilateral lower legs. He was discharged on Bactrim. They tried to get him home health through [facility] part [facility] of course they declined him. His wife is been wrapping these applying some form of silver foam dressing. He has a history of wounds before although nothing that would not heal with basic home topical dressings. He has 2 areas on the left medial, left anterior and left lateral and a smaller area on the right medial. All of these have considerable depth. Past medical history includes iron deficiency anemia, lymphedema followed by the rehab center at [facility] with lymphedema wraps I believe, DVT on chronic anticoagulation, prostate cancer, chronic venous insufficiency, hypertension. As mentioned he has compression pumps but does not use them. ABIs in our clinic were noncompressible bilaterally 10/14; patient with severe bilateral lymphedema right greater than left. He came in with bilateral lower extremity wounds left greater than right. Even though the right side has more of the edema most of the wounds here almost closed on the right medial. He has 3 remaining wounds on the left We have been using silver alginate under 4-layer compression I have been trying to get him to be compliant with his external compression pumps 10/21; patient with 3 small wounds on the left leg and 1 on the right medial in the setting of severe lymphedema  and chronic venous insufficiency. We have been using silver alginate under 4-layer compression he is using his external compression pumps twice a day 11/4; ARTERIAL STUDIES on the right show an ABI of 1.02 TBI of 0.858 with biphasic waveforms on the left 0.98 with a TBI of 0.55 and biphasic waveforms. Does not look like he has significant arterial disease. We are treating him for lymphedema he has compression pumps. He has punched-out areas on the left anterior left lateral and right medial lower extremities 11/11; after we obtained his arterial studies I put him in 4 layer compression. He is using his compression pumps probably once a day although I have asked him to do twice. Primary dressing to the wound is silver collagen he has severe lymphedema likely secondary to chronic venous insufficiency. Wounds on the left lateral, left medial and left anterior and a small area on the right medial 12/2; the area on the right anterior lower leg has healed. We initially thought that the area medially had healed as well however when her discharge nurse came in she detected fluid in the wound simply opened up. This is actually worse than I remember this pain. The area on the left lateral potentially slightly smaller He is also complaining about pain in his left hand he says that this is actually been getting some better he has been using topical creams on this. She asked that I look at this 12/9 after last weeks issues we have 2 wounds one on the  right medial lower leg and 1 on the left lateral. Both of these are in the same condition. I think because of thickened skin secondary to chronic lymphedema these wounds actually have depth of almost 0.8 cm. 12/16; the patient has 2 small but deep wounds one on the right medial and one on the left lateral. The right medial is actually the worst of these. He arrives in clinic today with absolutely terrible edema in the right leg apparently his 4-layer wrap fell down  to just above his ankle he did not think about this he is apparently been continuing to use his compression pump twice a day. The left leg looks a lot better. 05/09/2020 upon evaluation today patient appears to be doing decently well in regard to his wounds. Everything is measuring smaller the right leg still has a little bit deeper wound in the left seems to be almost completely healed in my opinion I am very pleased in general with how things are progressing. He has a 4- layer compression wrap we have been using endoform today we will probably have to use collagen just based on the fact that we do not have endoform it is on order. 1/6; the patient's wound on the left lateral lower leg has healed. Still has 1 on the right medial. 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 Using compression pumps at home twice a day. 1/13; left lateral lower leg is still healed. He has a deep punched out rectangular shaped wound on the right medial calf. Looking down at this it appears that he is attempting to epithelialize around the edges of the wound and on the base as well. His edema is reasonably well controlled we have been using collagen with absolutely no effect 1/20; left lateral lower leg remains closed he has extremitease stockings. The area on the right medial calf I aggressively debrided last week measures larger but the surface looks better. We have been using Hydrofera Blue. We ran Oasis through his insurance but we have not seen the results of this 1/27; left lower leg wound with chronic venous insufficiency and secondary lymphedema. I did aggressive debridement on this last week the wound seems to have come in healthy looking surface using Hydrofera Blue. He was denied for Oasis Objective Constitutional Patient is hypertensive.. Pulse regular and within target range for patient.Marland Kitchen Respirations regular, non-labored and within target range.. Temperature is normal and within the  target range for the patient.Marland Kitchen Appears in no distress. Vitals Time Taken: 2:59 PM, Height: 74 in, Source: Stated, Weight: 240 lbs, Source: Stated, BMI: 30.8, Temperature: 98.1 F, Pulse: 108 bpm, Respiratory Rate: 18 breaths/min, Blood Pressure: 194/76 mmHg. Cardiovascular Needle pulses are palpable. As usual mediocre edema on the right lower leg. General Notes: Wound exam; area appears smaller. Base of the wound looks healthy. No debridement was necessary. Integumentary (Hair, Skin) Wound #1 status is Open. Original cause of wound was Blister. The wound is located on the Right,Medial Lower Leg. The wound measures 0.7cm length x 0.8cm width x 0.5cm depth; 0.44cm^2 area and 0.22cm^3 volume. There is Fat Layer (Subcutaneous Tissue) exposed. There is no tunneling or undermining noted. There is a medium amount of serosanguineous drainage noted. The wound margin is distinct with the outline attached to the wound base. There is large (67-100%) red, pink granulation within the wound bed. There is a small (1-33%) amount of necrotic tissue within the wound bed including Adherent Slough. Assessment Active Problems ICD-10 Lymphedema, not elsewhere classified Non-pressure  chronic ulcer of other part of right lower leg with fat layer exposed Chronic venous hypertension (idiopathic) with ulcer and inflammation of right lower extremity Procedures Wound #1 Pre-procedure diagnosis of Wound #1 is a Lymphedema located on the Right,Medial Lower Leg . There was a Four Layer Compression Therapy Procedure by Baruch Gouty, RN. Post procedure Diagnosis Wound #1: Same as Pre-Procedure Plan Follow-up Appointments: Return Appointment in 1 week. Bathing/ Shower/ Hygiene: May shower with protection but do not get wound dressing(s) wet. - use a cast protector. Edema Control - Lymphedema / SCD / Other: Lymphedema Pumps. Use Lymphedema pumps on leg(s) 2-3 times a day for 45-60 minutes. If wearing any wraps or hose,  do not remove them. Continue exercising as instructed. - 3 times a day. Elevate legs to the level of the heart or above for 30 minutes daily and/or when sitting, a frequency of: - 3-4 times Avoid standing for long periods of time. Exercise regularly Compression stocking or Garment 30-40 mm/Hg pressure to: - patient to apply extremit-ease to left leg in the morning and remove at night. patient to apply lotion to left leg every night after removing the compression garment. WOUND #1: - Lower Leg Wound Laterality: Right, Medial Peri-Wound Care: Triamcinolone 15 (g) 1 x Per Week/ Discharge Instructions: Use triamcinolone 15 (g) as directed. MIXED WITH LOTION. Peri-Wound Care: Sween Lotion (Moisturizing lotion) 1 x Per Week/ Discharge Instructions: Apply moisturizing lotion as directed Prim Dressing: Hydrofera Blue Classic Foam, 2x2 in 1 x Per Week/ ary Discharge Instructions: Moisten with saline prior to applying to wound bed Secondary Dressing: Woven Gauze Sponge, Non-Sterile 4x4 in 1 x Per Week/ Discharge Instructions: Apply over primary dressing as directed. Com pression Wrap: FourPress (4 layer compression wrap) 1 x Per Week/ Discharge Instructions: Apply four layer compression as directed. APPLY UNNA BOOT FIRST LAYER TO UPPER PORTION OF LOWER LEG. 1. I am going to continue with Hydrofera Blue under the same compression 2. Given the improvement today I did not try another advanced treatment product. Electronic Signature(s) Signed: 06/14/2020 4:56:03 PM By: Linton Ham MD Entered By: Linton Ham on 06/14/2020 15:59:15 -------------------------------------------------------------------------------- SuperBill Details Patient Name: Date of Service: [place], [name] 06/14/2020 Medical Record Number: [id number] Patient Account Number: [id number] Date of Birth/Sex: Treating RN: 12/23/1945 (75 y.o. [name], [name] Primary Care Provider: Birdie [name] Other Clinician: Referring  Provider: Treating Provider/Extender: Drucilla Schmidt Weeks in Treatment: 16 Diagnosis Coding ICD-10 Codes Code Description I89.0 Lymphedema, not elsewhere classified [id number] Non-pressure chronic ulcer of other part of right lower leg with fat layer exposed I87.331 Chronic venous hypertension (idiopathic) with ulcer and inflammation of right lower extremity Facility Procedures CPT4 Code: [id number] Description: (Facility Use Only) [phone] - [facility] LWR RT LEG Modifier: Quantity: 1 Physician Procedures : CPT4 Code Description Modifier [id number] - WC PHYS LEVEL 3 - EST PT ICD-10 Diagnosis Description I89.0 Lymphedema, not elsewhere classified [id number] Non-pressure chronic ulcer of other part of right lower leg with fat layer exposed I87.331 Chronic  venous hypertension (idiopathic) with ulcer and inflammation of right lower extremity Quantity: 1 Electronic Signature(s) Signed: 06/14/2020 4:56:03 PM By: Linton Ham MD Entered By: Linton Ham on 06/14/2020 15:59:42

## 2020-06-21 ENCOUNTER — Other Ambulatory Visit: Payer: Self-pay

## 2020-06-21 ENCOUNTER — Encounter (HOSPITAL_BASED_OUTPATIENT_CLINIC_OR_DEPARTMENT_OTHER): Payer: Medicare Other | Attending: Internal Medicine | Admitting: Internal Medicine

## 2020-06-21 DIAGNOSIS — L97812 Non-pressure chronic ulcer of other part of right lower leg with fat layer exposed: Secondary | ICD-10-CM | POA: Insufficient documentation

## 2020-06-21 DIAGNOSIS — I872 Venous insufficiency (chronic) (peripheral): Secondary | ICD-10-CM | POA: Diagnosis not present

## 2020-06-21 DIAGNOSIS — Z86718 Personal history of other venous thrombosis and embolism: Secondary | ICD-10-CM | POA: Insufficient documentation

## 2020-06-21 DIAGNOSIS — Z8546 Personal history of malignant neoplasm of prostate: Secondary | ICD-10-CM | POA: Diagnosis not present

## 2020-06-21 DIAGNOSIS — Z7901 Long term (current) use of anticoagulants: Secondary | ICD-10-CM | POA: Insufficient documentation

## 2020-06-21 DIAGNOSIS — I890 Lymphedema, not elsewhere classified: Secondary | ICD-10-CM | POA: Insufficient documentation

## 2020-06-21 NOTE — Progress Notes (Signed)
[name], [name] ([id number]) Visit Report for 06/21/2020 Arrival Information Details Patient Name: Date of Service: [name], [name] 06/21/2020 2:45 PM Medical Record Number: [id number] Patient Account Number: [id number] Date of Birth/Sex: Treating RN: 10-Jan-1946 (75 y.o. [name] Primary Care Yarely Bebee: [name] Other Clinician: Referring Jozlyn Schatz: Treating Ainsley Sanguinetti/Extender: Alycia Rossetti in Treatment: 17 Visit Information History Since Last Visit Added or deleted any medications: No Patient Arrived: Ambulatory Any new allergies or adverse reactions: No Arrival Time: 14:50 Had a fall or experienced change in No Accompanied By: self activities of daily living that may affect Transfer Assistance: None risk of falls: Patient Identification Verified: Yes Signs or symptoms of abuse/neglect since last visito No Secondary Verification Process Completed: Yes Hospitalized since last visit: No Patient Requires Transmission-Based Precautions: No Implantable device outside of the clinic excluding No Patient Has Alerts: Yes cellular tissue based products placed in the center Patient Alerts: Patient on Blood Thinner since last visit: on warfarin Pain Present Now: No Bilateral ABI:  Electronic Signature(s) Signed: 06/21/2020 3:40:44 PM By: Mikeal Hawthorne EMT/HBOT/SD Entered By: Mikeal Hawthorne on 06/21/2020 14:56:31 -------------------------------------------------------------------------------- Compression Therapy Details Patient Name: Date of Service: [name], [name] 06/21/2020 2:45 PM Medical Record Number: [id number] Patient Account Number: [id number] Date of Birth/Sex: Treating RN: Mar 25, 1946 (74 y.o. [name] Primary Care Germani Gavilanes: [name] Other Clinician: Referring Reagyn Facemire: Treating Mckaylie Vasey/Extender: Drucilla Schmidt Weeks in Treatment: 17 Compression Therapy Performed for Wound Assessment: Wound #1 Right,Medial  Lower Leg Performed By: Clinician Baruch Gouty, RN Compression Type: Four Layer Post Procedure Diagnosis Same as Pre-procedure Electronic Signature(s) Signed: 06/21/2020 5:30:16 PM By: Deon Pilling Entered By: Deon Pilling on 06/21/2020 15:30:53 -------------------------------------------------------------------------------- Encounter Discharge Information Details Patient Name: Date of Service: [name], [name] 06/21/2020 2:45 PM Medical Record Number: [id number] Patient Account Number: [id number] Date of Birth/Sex: Treating RN: [birth year] (74 y.o. [name] Primary Care Kerianne Gurr: [name] Other Clinician: Referring Cap Massi: Treating Veleda Mun/Extender: Drucilla Schmidt Weeks in Treatment: 17 Encounter Discharge Information Items Post Procedure Vitals Discharge Condition: Stable Temperature (F): 98.4 Ambulatory Status: Ambulatory Pulse (bpm): 86 Discharge Destination: Home Respiratory Rate (breaths/min): 18 Transportation: Private Auto Blood Pressure (mmHg): 109/70 Accompanied By: self Schedule Follow-up Appointment: Yes Clinical Summary of Care: Patient Declined Electronic Signature(s) Signed: 06/21/2020 5:53:40 PM By: Baruch Gouty RN, [name] Entered By: Baruch Gouty on 06/21/2020 15:59:43 -------------------------------------------------------------------------------- Lower Extremity Assessment Details Patient Name: Date of Service: [name], [name] 06/21/2020 2:45 PM Medical Record Number: [id number] Patient Account Number: [id number] Date of Birth/Sex: Treating RN: 1946/03/12 (75 y.o. [name] Primary Care [name]: [name] Other Clinician: Referring [name]: Treating [name]/Extender: Drucilla Schmidt Weeks in Treatment: 17 Edema Assessment Assessed: [name]: No] [name]: No] Edema: [Left: Ye] [Right: s] Calf Left: Right: Point of Measurement: 46 cm From Medial Instep 47.5 cm Ankle Left:  Right: Point of Measurement: 15 cm From Medial Instep 35 cm Vascular Assessment Pulses: Dorsalis Pedis Palpable: [Right:Yes] Electronic Signature(s) Signed: 06/21/2020 3:40:44 PM By: Mikeal Hawthorne EMT/HBOT/SD Signed: 06/21/2020 5:30:16 PM By: Deon Pilling Entered By: Mikeal Hawthorne on 06/21/2020 14:58:14 -------------------------------------------------------------------------------- Multi Wound Chart Details Patient Name: Date of Service: [name], [name] 06/21/2020 2:45 PM Medical Record Number: [id number] Patient Account Number: [id number] Date of Birth/Sex: Treating RN: [birth year] (76 y.o. [name] Primary Care [name]: [name] Other Clinician: Referring [name]: Treating [name]/Extender: Drucilla Schmidt Weeks in Treatment: 17 Vital Signs Height(in): 74 Pulse(bpm): 86  Weight(lbs): 240 Blood Pressure(mmHg): 109/70 Body Mass Index(BMI): 31 Temperature(F): 98.4 Respiratory Rate(breaths/min): 15 Photos: [1:No Photos Right, Medial Lower Leg] [N/A:N/A N/A] Wound Location: [1:Blister] [N/A:N/A] Wounding Event: [1:Lymphedema] [N/A:N/A] Primary Etiology: [1:Lymphedema, Deep Vein Thrombosis, N/A] Comorbid History: [1:Hypertension, Received Radiation 01/15/2020] [N/A:N/A] Date Acquired: [1:17] [N/A:N/A] Weeks of Treatment: [1:Open] [N/A:N/A] Wound Status: [1:0.8x1x0.9] [N/A:N/A] Measurements L x W x D (cm) [1:0.628] [N/A:N/A] A (cm) : rea [1:0.565] [N/A:N/A] Volume (cm) : [1:20.00%] [N/A:N/A] % Reduction in A [1:rea: -615.20%] [N/A:N/A] % Reduction in Volume: [1:Full Thickness Without Exposed] [N/A:N/A] Classification: [1:Support Structures Small] [N/A:N/A] Exudate A mount: [1:Serous] [N/A:N/A] Exudate Type: [1:amber] [N/A:N/A] Exudate Color: [1:Distinct, outline attached] [N/A:N/A] Wound Margin: [1:Large (67-100%)] [N/A:N/A] Granulation A mount: [1:Red, Pink] [N/A:N/A] Granulation Quality: [1:Small (1-33%)] [N/A:N/A] Necrotic A  mount: [1:Fat Layer (Subcutaneous Tissue): Yes N/A] Exposed Structures: [1:Fascia: No Tendon: No Muscle: No Joint: No Bone: No Medium (34-66%)] [N/A:N/A] Epithelialization: [1:Debridement - Excisional] [N/A:N/A] Debridement: Pre-procedure Verification/Time Out 15:25 [N/A:N/A] Taken: [1:Lidocaine 4% Topical Solution] [N/A:N/A] Pain Control: [1:Subcutaneous, Slough] [N/A:N/A] Tissue Debrided: [1:Skin/Subcutaneous Tissue] [N/A:N/A] Level: [1:0.09] [N/A:N/A] Debridement A (sq cm): [1:rea Curette] [N/A:N/A] Instrument: [1:Minimum] [N/A:N/A] Bleeding: [1:Pressure] [N/A:N/A] Hemostasis A chieved: [1:0] [N/A:N/A] Procedural Pain: [1:0] [N/A:N/A] Post Procedural Pain: [1:Procedure was tolerated well] [N/A:N/A] Debridement Treatment Response: [1:0.8x1x0.4] [N/A:N/A] Post Debridement Measurements L x W x D (cm) [1:0.251] [N/A:N/A] Post Debridement Volume: (cm) [1:Compression Therapy] [N/A:N/A] Procedures Performed: [1:Debridement] Treatment Notes Electronic Signature(s) Signed: 06/21/2020 4:57:15 PM By: Linton Ham MD Signed: 06/21/2020 5:30:16 PM By: Deon Pilling Entered By: Linton Ham on 06/21/2020 15:36:57 -------------------------------------------------------------------------------- Multi-Disciplinary Care Plan Details Patient Name: Date of Service: [name], [name] 06/21/2020 2:45 PM Medical Record Number: [id number] Patient Account Number: [id number] Date of Birth/Sex: Treating RN: 02-01-1946 (75 y.o. [name] Primary Care Chanae Gemma: [name] Other Clinician: Referring Kendyl Festa: Treating Nataly Pacifico/Extender: Drucilla Schmidt Weeks in Treatment: 17 Active Inactive Pain, Acute or Chronic Nursing Diagnoses: Pain, acute or chronic: actual or potential Potential alteration in comfort, pain Goals: Patient will verbalize adequate pain control and receive pain control interventions during procedures as needed Date Initiated: 02/23/2020 Target  Resolution Date: 07/13/2020 Goal Status: Active Patient/caregiver will verbalize comfort level met Date Initiated: 02/23/2020 Date Inactivated: 03/22/2020 Target Resolution Date: 03/23/2020 Goal Status: Met Interventions: Provide education on pain management Reposition patient for comfort Notes: Wound/Skin Impairment Nursing Diagnoses: Knowledge deficit related to ulceration/compromised skin integrity Goals: Ulcer/skin breakdown will heal within 14 weeks Date Initiated: 02/23/2020 Target Resolution Date: 07/12/2020 Goal Status: Active Interventions: Assess patient/caregiver ability to obtain necessary supplies Assess patient/caregiver ability to perform ulcer/skin care regimen upon admission and as needed Provide education on ulcer and skin care Treatment Activities: Skin care regimen initiated : 02/23/2020 Topical wound management initiated : 02/23/2020 Notes: Electronic Signature(s) Signed: 06/21/2020 5:30:16 PM By: Deon Pilling Entered By: Deon Pilling on 06/21/2020 15:33:39 -------------------------------------------------------------------------------- Pain Assessment Details Patient Name: Date of Service: [name], [name] 06/21/2020 2:45 PM Medical Record Number: [id number] Patient Account Number: [id number] Date of Birth/Sex: Treating RN: 10-16-1945 (75 y.o. [name] Primary Care Tykisha Areola: [name] Other Clinician: Referring Kayla Weekes: Treating Keeya Dyckman/Extender: Drucilla Schmidt Weeks in Treatment: 17 Active Problems Location of Pain Severity and Description of Pain Patient Has Paino No Site Locations With Dressing Change: No Pain Management and Medication Current Pain Management: Electronic Signature(s) Signed: 06/21/2020 3:40:44 PM By: Mikeal Hawthorne EMT/HBOT/SD Signed: 06/21/2020 5:30:16 PM By: Deon Pilling Entered By: Mikeal Hawthorne on 06/21/2020  14:57:03 -------------------------------------------------------------------------------- Patient/Caregiver Education Details Patient  Name: Date of Service: [name], [name] 2/3/2022andnbsp2:45 PM Medical Record Number: [id number] Patient Account Number: [id number] Date of Birth/Gender: Treating RN: 16-Dec-1945 (75 y.o. [name] Primary Care Physician: [name] Other Clinician: Referring Physician: Treating Physician/Extender: Alycia Rossetti in Treatment: 17 Education Assessment Education Provided To: Patient Education Topics Provided Pain: Handouts: A Guide to Pain Control Methods: Explain/Verbal Responses: Reinforcements needed Electronic Signature(s) Signed: 06/21/2020 5:30:16 PM By: Deon Pilling Entered By: Deon Pilling on 06/21/2020 15:33:50 -------------------------------------------------------------------------------- Wound Assessment Details Patient Name: Date of Service: [name], [name] 06/21/2020 2:45 PM Medical Record Number: [id number] Patient Account Number: [id number] Date of Birth/Sex: Treating RN: 1945-12-26 (75 y.o. [name], [name] Primary Care Auda Finfrock: [name] Other Clinician: Referring Amear Strojny: Treating Rihaan Barrack/Extender: Drucilla Schmidt Weeks in Treatment: 17 Wound Status Wound Number: 1 Primary Lymphedema Etiology: Wound Location: Right, Medial Lower Leg Wound Status: Open Wounding Event: Blister Comorbid Lymphedema, Deep Vein Thrombosis, Hypertension, Date Acquired: 01/15/2020 History: Received Radiation Weeks Of Treatment: 17 Clustered Wound: No Wound Measurements Length: (cm) 0.8 Width: (cm) 1 Depth: (cm) 0.9 Area: (cm) 0.628 Volume: (cm) 0.565 % Reduction in Area: 20% % Reduction in Volume: -615.2% Epithelialization: Medium (34-66%) Tunneling: No Undermining: No Wound Description Classification: Full Thickness Without Exposed Support Structures Wound Margin:  Distinct, outline attached Exudate Amount: Small Exudate Type: Serous Exudate Color: amber Foul Odor After Cleansing: No Slough/Fibrino No Wound Bed Granulation Amount: Large (67-100%) Exposed Structure Granulation Quality: Red, Pink Fascia Exposed: No Necrotic Amount: Small (1-33%) Fat Layer (Subcutaneous Tissue) Exposed: Yes Necrotic Quality: Adherent Slough Tendon Exposed: No Muscle Exposed: No Joint Exposed: No Bone Exposed: No Treatment Notes Wound #1 (Lower Leg) Wound Laterality: Right, Medial Cleanser Peri-Wound Care Triamcinolone 15 (g) Discharge Instruction: Use triamcinolone 15 (g) as directed. MIXED WITH LOTION. Sween Lotion (Moisturizing lotion) Discharge Instruction: Apply moisturizing lotion as directed Topical Primary Dressing Hydrofera Blue Classic Foam, 2x2 in Discharge Instruction: Moisten with saline prior to applying to wound bed Secondary Dressing Woven Gauze Sponge, Non-Sterile 4x4 in Discharge Instruction: Apply over primary dressing as directed. Secured With Compression Wrap Compression Stockings Environmental education officer) Signed: 06/21/2020 3:40:44 PM By: Mikeal Hawthorne EMT/HBOT/SD Signed: 06/21/2020 5:30:16 PM By: Deon Pilling Entered By: Mikeal Hawthorne on 06/21/2020 15:01:01 -------------------------------------------------------------------------------- Vitals Details Patient Name: Date of Service: [name], [name] 06/21/2020 2:45 PM Medical Record Number: [id number] Patient Account Number: [id number] Date of Birth/Sex: Treating RN: 1946-04-09 (75 y.o. [name], [name] Primary Care Sutton Hirsch: [name] Other Clinician: Referring Jonny Dearden: Treating Maayan Jenning/Extender: Drucilla Schmidt Weeks in Treatment: 17 Vital Signs Time Taken: 14:56 Temperature (F): 98.4 Height (in): 74 Pulse (bpm): 86 Weight (lbs): 240 Respiratory Rate (breaths/min): 15 Body Mass Index (BMI): 30.8 Blood Pressure (mmHg):  109/70 Reference Range: 80 - 120 mg / dl Electronic Signature(s) Signed: 06/21/2020 3:40:44 PM By: Mikeal Hawthorne EMT/HBOT/SD Entered By: Mikeal Hawthorne on 06/21/2020 14:56:56

## 2020-06-21 NOTE — Progress Notes (Signed)
Vaccaro, SHEP ([id number]) Visit Report for 06/21/2020 Debridement Details Patient Name: Date of Service: [name], [name] 06/21/2020 2:45 PM Medical Record Number: [id number] Patient Account Number: [id number] Date of Birth/Sex: Treating RN: 05/21/1945 (75 y.o. [name], [name] Primary Care Provider: [name] Other Clinician: Referring Provider: Treating Provider/Extender: Drucilla Schmidt Weeks in Treatment: 17 Debridement Performed for Assessment: Wound #1 Right,Medial Lower Leg Performed By: Physician [name]., MD Debridement Type: Debridement Level of Consciousness (Pre-procedure): Awake and Alert Pre-procedure Verification/Time Out Yes - 15:25 Taken: Start Time: 15:26 Pain Control: Lidocaine 4% T opical Solution T Area Debrided (L x W): otal 0.3 (cm) x 0.3 (cm) = 0.09 (cm) Tissue and other material debrided: Viable, Non-Viable, Slough, Subcutaneous, Skin: Dermis , Fibrin/Exudate, Slough Level: Skin/Subcutaneous Tissue Debridement Description: Excisional Instrument: Curette Bleeding: Minimum Hemostasis Achieved: Pressure End Time: 15:31 Procedural Pain: 0 Post Procedural Pain: 0 Response to Treatment: Procedure was tolerated well Level of Consciousness (Post- Awake and Alert procedure): Post Debridement Measurements of Total Wound Length: (cm) 0.8 Width: (cm) 1 Depth: (cm) 0.4 Volume: (cm) 0.251 Character of Wound/Ulcer Post Debridement: Improved Post Procedure Diagnosis Same as Pre-procedure Electronic Signature(s) Signed: 06/21/2020 4:57:15 PM By: Linton Ham MD Signed: 06/21/2020 5:30:16 PM By: Deon Pilling Entered By: Linton Ham on 06/21/2020 15:37:08 -------------------------------------------------------------------------------- HPI Details Patient Name: Date of Service: [name], [name] 06/21/2020 2:45 PM Medical Record Number: [id number] Patient Account Number: [id number] Date of Birth/Sex: Treating RN: 02/08/1946 (75 y.o. [name] Primary Care Provider: [name] Other Clinician: Referring Provider: Treating Provider/Extender: Drucilla Schmidt Weeks in Treatment: 17 History of Present Illness HPI Description: ADMISSION 02/23/2020 Patient is a 75 year old man who lives in [place] who arrives accompanied by his wife. He has a history of chronic lymphedema and venous insufficiency in his bilateral lower legs which may have something to do that with having a history of DVT as well as being treated for prostate cancer. In any case he recently got compression pumps at home but compliance has been an issue here. He has compression stockings however they are probably not sufficient enough to control swelling. They tell us that things deteriorated for him in late August he was admitted to [facility] for 7 days. This was with cellulitis I think of his bilateral lower legs. Discharge he was noted to have wounds on his bilateral lower legs. He was discharged on Bactrim. They tried to get him home health through [facility] of course they declined him. His wife is been wrapping these applying some form of silver foam dressing. He has a history of wounds before although nothing that would not heal with basic home topical dressings. He has 2 areas on the left medial, left anterior and left lateral and a smaller area on the right medial. All of these have considerable depth. Past medical history includes iron deficiency anemia, lymphedema followed by the rehab center at [facility] with lymphedema wraps I believe, DVT on chronic anticoagulation, prostate cancer, chronic venous insufficiency, hypertension. As mentioned he has compression pumps but does not use them. ABIs in our clinic were noncompressible bilaterally 10/14; patient with severe bilateral lymphedema right greater than left. He came in with bilateral lower extremity wounds left greater than right. Even though  the right side has more of the edema most of the wounds here almost closed on the right medial. He has 3 remaining wounds on the left We have been using silver  alginate under 4-layer compression I have been trying to get him to be compliant with his external compression pumps 10/21; patient with 3 small wounds on the left leg and 1 on the right medial in the setting of severe lymphedema and chronic venous insufficiency. We have been using silver alginate under 4-layer compression he is using his external compression pumps twice a day 11/4; ARTERIAL STUDIES on the right show an ABI of 1.02 TBI of 0.858 with biphasic waveforms on the left 0.98 with a TBI of 0.55 and biphasic waveforms. Does not look like he has significant arterial disease. We are treating him for lymphedema he has compression pumps. He has punched-out areas on the left anterior left lateral and right medial lower extremities 11/11; after we obtained his arterial studies I put him in 4 layer compression. He is using his compression pumps probably once a day although I have asked him to do twice. Primary dressing to the wound is silver collagen he has severe lymphedema likely secondary to chronic venous insufficiency. Wounds on the left lateral, left medial and left anterior and a small area on the right medial 12/2; the area on the right anterior lower leg has healed. We initially thought that the area medially had healed as well however when her discharge nurse came in she detected fluid in the wound simply opened up. This is actually worse than I remember this pain. The area on the left lateral potentially slightly smaller He is also complaining about pain in his left hand he says that this is actually been getting some better he has been using topical creams on this. She asked that I look at this 12/9 after last weeks issues we have 2 wounds one on the right medial lower leg and 1 on the left lateral. Both of these are in the same  condition. I think because of thickened skin secondary to chronic lymphedema these wounds actually have depth of almost 0.8 cm. 12/16; the patient has 2 small but deep wounds one on the right medial and one on the left lateral. The right medial is actually the worst of these. He arrives in clinic today with absolutely terrible edema in the right leg apparently his 4-layer wrap fell down to just above his ankle he did not think about this he is apparently been continuing to use his compression pump twice a day. The left leg looks a lot better. 05/09/2020 upon evaluation today patient appears to be doing decently well in regard to his wounds. Everything is measuring smaller the right leg still has a little bit deeper wound in the left seems to be almost completely healed in my opinion I am very pleased in general with how things are progressing. He has a 4- layer compression wrap we have been using endoform today we will probably have to use collagen just based on the fact that we do not have endoform it is on order. 1/6; the patient's wound on the left lateral lower leg has healed. Still has 1 on the right medial. 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 Using compression pumps at home twice a day. 1/13; left lateral lower leg is still healed. He has a deep punched out rectangular shaped wound on the right medial calf. Looking down at this it appears that he is attempting to epithelialize around the edges of the wound and on the base as well. His edema is reasonably well controlled we have been using collagen with absolutely no effect  1/20; left lateral lower leg remains closed he has extremitease stockings. The area on the right medial calf I aggressively debrided last week measures larger but the surface looks better. We have been using Hydrofera Blue. We ran Oasis through his insurance but we have not seen the results of this 1/27; left lower leg wound with chronic venous  insufficiency and secondary lymphedema. I did aggressive debridement on this last week the wound seems to have come in healthy looking surface using Hydrofera Blue. He was denied for Oasis 2/3; small divot in the right medial lower leg. Under illumination the walls of this divot are epithelialized however the base has slough which I removed with a curette we have been using Hydrofera Blue Electronic Signature(s) Signed: 06/21/2020 4:57:15 PM By: Linton Ham MD Entered By: Linton Ham on 06/21/2020 15:38:10 -------------------------------------------------------------------------------- Physical Exam Details Patient Name: Date of Service: [name], [name] 06/21/2020 2:45 PM Medical Record Number: [id number] Patient Account Number: [id number] Date of Birth/Sex: Treating RN: 1945/10/18 (76 y.o. [name] Primary Care Provider: [name] Other Clinician: Referring Provider: Treating Provider/Extender: Drucilla Schmidt Weeks in Treatment: 17 Cardiovascular Pedal pulses are palpable. Good edema control.. Notes Wound exam; really looks the same as last time a however under illumination I think the walls of this divot/cone-shaped wound are actually epithelialized. The base of it has slough I remove this with a #3 curette. Underneath the surface looks really healthy. Electronic Signature(s) Signed: 06/21/2020 4:57:15 PM By: Linton Ham MD Entered By: Linton Ham on 06/21/2020 15:39:23 -------------------------------------------------------------------------------- Physician Orders Details Patient Name: Date of Service: [name], [name] 06/21/2020 2:45 PM Medical Record Number: [id number] Patient Account Number: [id number] Date of Birth/Sex: Treating RN: Apr 13, 1946 (75 y.o. [name] Primary Care Provider: [name] Other Clinician: Referring Provider: Treating Provider/Extender: Drucilla Schmidt Weeks in Treatment:  41 Verbal / Phone Orders: No Diagnosis Coding Follow-up Appointments Return Appointment in 1 week. Bathing/ Shower/ Hygiene May shower with protection but do not get wound dressing(s) wet. - use a cast protector. Edema Control - Lymphedema / SCD / Other Bilateral Lower Extremities Lymphedema Pumps. Use Lymphedema pumps on leg(s) 2-3 times a day for 45-60 minutes. If wearing any wraps or hose, do not remove them. Continue exercising as instructed. - 3 times a day. Elevate legs to the level of the heart or above for 30 minutes daily and/or when sitting, a frequency of: - 3-4 times Avoid standing for long periods of time. Exercise regularly Compression stocking or Garment 30-40 mm/Hg pressure to: - patient to apply extremit-ease to left leg in the morning and remove at night. patient to apply lotion to left leg every night after removing the compression garment. Wound Treatment Wound #1 - Lower Leg Wound Laterality: Right, Medial Peri-Wound Care: Triamcinolone 15 (g) 1 x Per Week Discharge Instructions: Use triamcinolone 15 (g) as directed. MIXED WITH LOTION. Peri-Wound Care: Sween Lotion (Moisturizing lotion) 1 x Per Week Discharge Instructions: Apply moisturizing lotion as directed Prim Dressing: Hydrofera Blue Classic Foam, 2x2 in 1 x Per Week ary Discharge Instructions: Moisten with saline prior to applying to wound bed Secondary Dressing: Woven Gauze Sponge, Non-Sterile 4x4 in 1 x Per Week Discharge Instructions: Apply over primary dressing as directed. Compression Wrap: FourPress (4 layer compression wrap) 1 x Per Week Discharge Instructions: Apply four layer compression as directed. APPLY UNNA BOOT FIRST LAYER TO UPPER PORTION OF LOWER LEG. Electronic Signature(s) Signed: 06/21/2020 4:57:15 PM By: Linton Ham MD  Signed: 06/21/2020 5:30:16 PM By: Deon Pilling Entered By: Deon Pilling on 06/21/2020  15:33:31 -------------------------------------------------------------------------------- Problem List Details Patient Name: Date of Service: [name], [name] 06/21/2020 2:45 PM Medical Record Number: [id number] Patient Account Number: [id number] Date of Birth/Sex: Treating RN: 1946/04/02 (75 y.o. [name] Primary Care Provider: [name] Other Clinician: Referring Provider: Treating Provider/Extender: Drucilla Schmidt Weeks in Treatment: 17 Active Problems ICD-10 Encounter Code Description Active Date MDM Diagnosis I89.0 Lymphedema, not elsewhere classified 02/23/2020 No Yes L97.812 Non-pressure chronic ulcer of other part of right lower leg with fat layer 02/23/2020 No Yes exposed I87.331 Chronic venous hypertension (idiopathic) with ulcer and inflammation of right 06/14/2020 No Yes lower extremity Inactive Problems ICD-10 Code Description Active Date Inactive Date L97.822 Non-pressure chronic ulcer of other part of left lower leg with fat layer exposed 02/23/2020 02/23/2020 Resolved Problems Electronic Signature(s) Signed: 06/21/2020 4:57:15 PM By: Linton Ham MD Entered By: Linton Ham on 06/21/2020 15:34:55 -------------------------------------------------------------------------------- Progress Note Details Patient Name: Date of Service: [name], [name] 06/21/2020 2:45 PM Medical Record Number: [id number] Patient Account Number: [id number] Date of Birth/Sex: Treating RN: Jul 18, 1945 (74 y.o. [name] Primary Care Provider: [name] Other Clinician: Referring Provider: Treating Provider/Extender: Drucilla Schmidt Weeks in Treatment: 17 Subjective History of Present Illness (HPI) ADMISSION 02/23/2020 Patient is a 75 year old man who lives in [place] who arrives accompanied by his wife. He has a history of chronic lymphedema and venous insufficiency in his bilateral lower legs which may have something to  do that with having a history of DVT as well as being treated for prostate cancer. In any case he recently got compression pumps at home but compliance has been an issue here. He has compression stockings however they are probably not sufficient enough to control swelling. They tell us that things deteriorated for him in late August he was admitted to [facility] for 7 days. This was with cellulitis I think of his bilateral lower legs. Discharge he was noted to have wounds on his bilateral lower legs. He was discharged on Bactrim. They tried to get him home health through [facility] of course they declined him. His wife is been wrapping these applying some form of silver foam dressing. He has a history of wounds before although nothing that would not heal with basic home topical dressings. He has 2 areas on the left medial, left anterior and left lateral and a smaller area on the right medial. All of these have considerable depth. Past medical history includes iron deficiency anemia, lymphedema followed by the rehab center at [facility] with lymphedema wraps I believe, DVT on chronic anticoagulation, prostate cancer, chronic venous insufficiency, hypertension. As mentioned he has compression pumps but does not use them. ABIs in our clinic were noncompressible bilaterally 10/14; patient with severe bilateral lymphedema right greater than left. He came in with bilateral lower extremity wounds left greater than right. Even though the right side has more of the edema most of the wounds here almost closed on the right medial. He has 3 remaining wounds on the left We have been using silver alginate under 4-layer compression I have been trying to get him to be compliant with his external compression pumps 10/21; patient with 3 small wounds on the left leg and 1 on the right medial in the setting of severe lymphedema and chronic venous insufficiency. We have been using silver alginate  under 4-layer compression he is  using his external compression pumps twice a day 11/4; ARTERIAL STUDIES on the right show an ABI of 1.02 TBI of 0.858 with biphasic waveforms on the left 0.98 with a TBI of 0.55 and biphasic waveforms. Does not look like he has significant arterial disease. We are treating him for lymphedema he has compression pumps. He has punched-out areas on the left anterior left lateral and right medial lower extremities 11/11; after we obtained his arterial studies I put him in 4 layer compression. He is using his compression pumps probably once a day although I have asked him to do twice. Primary dressing to the wound is silver collagen he has severe lymphedema likely secondary to chronic venous insufficiency. Wounds on the left lateral, left medial and left anterior and a small area on the right medial 12/2; the area on the right anterior lower leg has healed. We initially thought that the area medially had healed as well however when her discharge nurse came in she detected fluid in the wound simply opened up. This is actually worse than I remember this pain. The area on the left lateral potentially slightly smaller He is also complaining about pain in his left hand he says that this is actually been getting some better he has been using topical creams on this. She asked that I look at this 12/9 after last weeks issues we have 2 wounds one on the right medial lower leg and 1 on the left lateral. Both of these are in the same condition. I think because of thickened skin secondary to chronic lymphedema these wounds actually have depth of almost 0.8 cm. 12/16; the patient has 2 small but deep wounds one on the right medial and one on the left lateral. The right medial is actually the worst of these. He arrives in clinic today with absolutely terrible edema in the right leg apparently his 4-layer wrap fell down to just above his ankle he did not think about this he is apparently  been continuing to use his compression pump twice a day. The left leg looks a lot better. 05/09/2020 upon evaluation today patient appears to be doing decently well in regard to his wounds. Everything is measuring smaller the right leg still has a little bit deeper wound in the left seems to be almost completely healed in my opinion I am very pleased in general with how things are progressing. He has a 4- layer compression wrap we have been using endoform today we will probably have to use collagen just based on the fact that we do not have endoform it is on order. 1/6; the patient's wound on the left lateral lower leg has healed. Still has 1 on the right medial. 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 Using compression pumps at home twice a day. 1/13; left lateral lower leg is still healed. He has a deep punched out rectangular shaped wound on the right medial calf. Looking down at this it appears that he is attempting to epithelialize around the edges of the wound and on the base as well. His edema is reasonably well controlled we have been using collagen with absolutely no effect 1/20; left lateral lower leg remains closed he has extremitease stockings. The area on the right medial calf I aggressively debrided last week measures larger but the surface looks better. We have been using Hydrofera Blue. We ran Oasis through his insurance but we have not seen the results of this 1/27; left lower leg wound  with chronic venous insufficiency and secondary lymphedema. I did aggressive debridement on this last week the wound seems to have come in healthy looking surface using Hydrofera Blue. He was denied for Oasis 2/3; small divot in the right medial lower leg. Under illumination the walls of this divot are epithelialized however the base has slough which I removed with a curette we have been using Hydrofera Blue Objective Constitutional Vitals Time Taken: 2:56 PM, Height: 74 in,  Weight: 240 lbs, BMI: 30.8, Temperature: 98.4 F, Pulse: 86 bpm, Respiratory Rate: 15 breaths/min, Blood Pressure: 109/70 mmHg. Cardiovascular Pedal pulses are palpable. Good edema control.. General Notes: Wound exam; really looks the same as last time a however under illumination I think the walls of this divot/cone-shaped wound are actually epithelialized. The base of it has slough I remove this with a #3 curette. Underneath the surface looks really healthy. Integumentary (Hair, Skin) Wound #1 status is Open. Original cause of wound was Blister. The wound is located on the Right,Medial Lower Leg. The wound measures 0.8cm length x 1cm width x 0.9cm depth; 0.628cm^2 area and 0.565cm^3 volume. There is Fat Layer (Subcutaneous Tissue) exposed. There is no tunneling or undermining noted. There is a small amount of serous drainage noted. The wound margin is distinct with the outline attached to the wound base. There is large (67-100%) red, pink granulation within the wound bed. There is a small (1-33%) amount of necrotic tissue within the wound bed including Adherent Slough. Assessment Active Problems ICD-10 Lymphedema, not elsewhere classified Non-pressure chronic ulcer of other part of right lower leg with fat layer exposed Chronic venous hypertension (idiopathic) with ulcer and inflammation of right lower extremity Procedures Wound #1 Pre-procedure diagnosis of Wound #1 is a Lymphedema located on the Right,Medial Lower Leg . There was a Excisional Skin/Subcutaneous Tissue Debridement with a total area of 0.09 sq cm performed by [name]., MD. With the following instrument(s): Curette to remove Viable and Non-Viable tissue/material. Material removed includes Subcutaneous Tissue, Slough, Skin: Dermis, and Fibrin/Exudate after achieving pain control using Lidocaine 4% T opical Solution. A time out was conducted at 15:25, prior to the start of the procedure. A Minimum amount of bleeding  was controlled with Pressure. The procedure was tolerated well with a pain level of 0 throughout and a pain level of 0 following the procedure. Post Debridement Measurements: 0.8cm length x 1cm width x 0.4cm depth; 0.251cm^3 volume. Character of Wound/Ulcer Post Debridement is improved. Post procedure Diagnosis Wound #1: Same as Pre-Procedure Pre-procedure diagnosis of Wound #1 is a Lymphedema located on the Right,Medial Lower Leg . There was a Four Layer Compression Therapy Procedure by Baruch Gouty, RN. Post procedure Diagnosis Wound #1: Same as Pre-Procedure Plan Follow-up Appointments: Return Appointment in 1 week. Bathing/ Shower/ Hygiene: May shower with protection but do not get wound dressing(s) wet. - use a cast protector. Edema Control - Lymphedema / SCD / Other: Lymphedema Pumps. Use Lymphedema pumps on leg(s) 2-3 times a day for 45-60 minutes. If wearing any wraps or hose, do not remove them. Continue exercising as instructed. - 3 times a day. Elevate legs to the level of the heart or above for 30 minutes daily and/or when sitting, a frequency of: - 3-4 times Avoid standing for long periods of time. Exercise regularly Compression stocking or Garment 30-40 mm/Hg pressure to: - patient to apply extremit-ease to left leg in the morning and remove at night. patient to apply lotion to left leg every night after removing the  compression garment. WOUND #1: - Lower Leg Wound Laterality: Right, Medial Peri-Wound Care: Triamcinolone 15 (g) 1 x Per Week/ Discharge Instructions: Use triamcinolone 15 (g) as directed. MIXED WITH LOTION. Peri-Wound Care: Sween Lotion (Moisturizing lotion) 1 x Per Week/ Discharge Instructions: Apply moisturizing lotion as directed Prim Dressing: Hydrofera Blue Classic Foam, 2x2 in 1 x Per Week/ ary Discharge Instructions: Moisten with saline prior to applying to wound bed Secondary Dressing: Woven Gauze Sponge, Non-Sterile 4x4 in 1 x Per Week/ Discharge  Instructions: Apply over primary dressing as directed. Com pression Wrap: FourPress (4 layer compression wrap) 1 x Per Week/ Discharge Instructions: Apply four layer compression as directed. APPLY UNNA BOOT FIRST LAYER TO UPPER PORTION OF LOWER LEG. 1. Continue with Hydrofera Blue. Only the small open area on the depth of this cone-shaped wound is still open however even after debridement this appears healthy. 2. Continue with compression pumps and wraps the edema control is good Electronic Signature(s) Signed: 06/21/2020 4:57:15 PM By: Linton Ham MD Entered By: Linton Ham on 06/21/2020 15:41:16 -------------------------------------------------------------------------------- SuperBill Details Patient Name: Date of Service: Brazzle, [name] 06/21/2020 Medical Record Number: [id number] Patient Account Number: [id number] Date of Birth/Sex: Treating RN: 05/06/1946 (74 y.o. 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

## 2020-06-28 ENCOUNTER — Other Ambulatory Visit: Payer: Self-pay

## 2020-06-28 ENCOUNTER — Encounter (HOSPITAL_BASED_OUTPATIENT_CLINIC_OR_DEPARTMENT_OTHER): Payer: Medicare Other | Admitting: Internal Medicine

## 2020-06-28 DIAGNOSIS — L97812 Non-pressure chronic ulcer of other part of right lower leg with fat layer exposed: Secondary | ICD-10-CM | POA: Diagnosis not present

## 2020-06-28 NOTE — Progress Notes (Signed)
[name], [name] ([id number]) Visit Report for 06/28/2020 HPI Details Patient Name: Date of Service: [name], [name] 06/28/2020 12:30 PM Medical Record Number: [id number] Patient Account Number: [id number] Date of Birth/Sex: Treating RN: 1945/07/12 (75 y.o. [name] Primary Care Provider: [name] Other Clinician: Referring Provider: Treating Provider/Extender: Drucilla Schmidt Weeks in Treatment: 18 History of Present Illness HPI Description: ADMISSION 02/23/2020 Patient is a 75 year old man who lives in [place] who arrives accompanied by his wife. He has a history of chronic lymphedema and venous insufficiency in his bilateral lower legs which may have something to do that with having a history of DVT as well as being treated for prostate cancer. In any case he recently got compression pumps at home but compliance has been an issue here. He has compression stockings however they are probably not sufficient enough to control swelling. They tell us that things deteriorated for him in late August he was admitted to [facility] for 7 days. This was with cellulitis I think of his bilateral lower legs. Discharge he was noted to have wounds on his bilateral lower legs. He was discharged on Bactrim. They tried to get him home health through [facility] of course they declined him. His wife is been wrapping these applying some form of silver foam dressing. He has a history of wounds before although nothing that would not heal with basic home topical dressings. He has 2 areas on the left medial, left anterior and left lateral and a smaller area on the right medial. All of these have considerable depth. Past medical history includes iron deficiency anemia, lymphedema followed by the rehab center at [facility] with lymphedema wraps I believe, DVT on chronic anticoagulation, prostate cancer, chronic venous insufficiency, hypertension. As mentioned he has  compression pumps but does not use them. ABIs in our clinic were noncompressible bilaterally 10/14; patient with severe bilateral lymphedema right greater than left. He came in with bilateral lower extremity wounds left greater than right. Even though the right side has more of the edema most of the wounds here almost closed on the right medial. He has 3 remaining wounds on the left We have been using silver alginate under 4-layer compression I have been trying to get him to be compliant with his external compression pumps 10/21; patient with 3 small wounds on the left leg and 1 on the right medial in the setting of severe lymphedema and chronic venous insufficiency. We have been using silver alginate under 4-layer compression he is using his external compression pumps twice a day 11/4; ARTERIAL STUDIES on the right show an ABI of 1.02 TBI of 0.858 with biphasic waveforms on the left 0.98 with a TBI of 0.55 and biphasic waveforms. Does not look like he has significant arterial disease. We are treating him for lymphedema he has compression pumps. He has punched-out areas on the left anterior left lateral and right medial lower extremities 11/11; after we obtained his arterial studies I put him in 4 layer compression. He is using his compression pumps probably once a day although I have asked him to do twice. Primary dressing to the wound is silver collagen he has severe lymphedema likely secondary to chronic venous insufficiency. Wounds on the left lateral, left medial and left anterior and a small area on the right medial 12/2; the area on the right anterior lower leg has healed. We initially thought that the area medially had healed as well however when  her discharge nurse came in she detected fluid in the wound simply opened up. This is actually worse than I remember this pain. The area on the left lateral potentially slightly smaller He is also complaining about pain in his left hand he says that  this is actually been getting some better he has been using topical creams on this. She asked that I look at this 12/9 after last weeks issues we have 2 wounds one on the right medial lower leg and 1 on the left lateral. Both of these are in the same condition. I think because of thickened skin secondary to chronic lymphedema these wounds actually have depth of almost 0.8 cm. 12/16; the patient has 2 small but deep wounds one on the right medial and one on the left lateral. The right medial is actually the worst of these. He arrives in clinic today with absolutely terrible edema in the right leg apparently his 4-layer wrap fell down to just above his ankle he did not think about this he is apparently been continuing to use his compression pump twice a day. The left leg looks a lot better. 05/09/2020 upon evaluation today patient appears to be doing decently well in regard to his wounds. Everything is measuring smaller the right leg still has a little bit deeper wound in the left seems to be almost completely healed in my opinion I am very pleased in general with how things are progressing. He has a 4- layer compression wrap we have been using endoform today we will probably have to use collagen just based on the fact that we do not have endoform it is on order. 1/6; the patient's wound on the left lateral lower leg has healed. Still has 1 on the right medial. 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 Using compression pumps at home twice a day. 1/13; left lateral lower leg is still healed. He has a deep punched out rectangular shaped wound on the right medial calf. Looking down at this it appears that he is attempting to epithelialize around the edges of the wound and on the base as well. His edema is reasonably well controlled we have been using collagen with absolutely no effect 1/20; left lateral lower leg remains closed he has extremitease stockings. The area on the right medial  calf I aggressively debrided last week measures larger but the surface looks better. We have been using Hydrofera Blue. We ran Oasis through his insurance but we have not seen the results of this 1/27; left lower leg wound with chronic venous insufficiency and secondary lymphedema. I did aggressive debridement on this last week the wound seems to have come in healthy looking surface using Hydrofera Blue. He was denied for Oasis 2/3; small divot in the right medial lower leg. Under illumination the walls of this divot are epithelialized however the base has slough which I removed with a curette we have been using Hydrofera Blue 2/10 small divot on the right medial lower leg pinpoint illumination at the base of this cone-shaped wound. We have been using Hydrofera Blue but I will switch to calcium alginate this week Electronic Signature(s) Signed: 06/28/2020 5:13:40 PM By: Linton Ham MD Entered By: Linton Ham on 06/28/2020 13:31:38 -------------------------------------------------------------------------------- Physical Exam Details Patient Name: Date of Service: [name], [name] 06/28/2020 12:30 PM Medical Record Number: [id number] Patient Account Number: [id number] Date of Birth/Sex: Treating RN: 28-Feb-1946 (75 y.o. [name] Primary Care Provider: [name] Other Clinician: Referring Provider:  Treating Provider/Extender: Drucilla Schmidt Weeks in Treatment: 18 Constitutional Patient is hypertensive.. Pulse regular and within target range for patient.Marland Kitchen Respirations regular, non-labored and within target range.. Temperature is normal and within the target range for the patient.Marland Kitchen Appears in no distress. Cardiovascular Pedal pulses are palpable. We have good edema control. Notes Wound exam; a pinpoint area at the bottom of this cone-shaped wound. I think that is all that is left that is open the rest of this is epithelialized. Electronic  Signature(s) Signed: 06/28/2020 5:13:40 PM By: Linton Ham MD Entered By: Linton Ham on 06/28/2020 13:32:34 -------------------------------------------------------------------------------- Physician Orders Details Patient Name: Date of Service: [name], [name] 06/28/2020 12:30 PM Medical Record Number: [id number] Patient Account Number: [id number] Date of Birth/Sex: Treating RN: May 05, 1946 (75 y.o. [name] Primary Care Provider: [name] Other Clinician: Referring Provider: Treating Provider/Extender: Drucilla Schmidt Weeks in Treatment: 30 Verbal / Phone Orders: No Diagnosis Coding ICD-10 Coding Code Description I89.0 Lymphedema, not elsewhere classified L97.812 Non-pressure chronic ulcer of other part of right lower leg with fat layer exposed I87.331 Chronic venous hypertension (idiopathic) with ulcer and inflammation of right lower extremity Follow-up Appointments Return Appointment in 1 week. Bathing/ Shower/ Hygiene May shower with protection but do not get wound dressing(s) wet. - use a cast protector. Edema Control - Lymphedema / SCD / Other Bilateral Lower Extremities Lymphedema Pumps. Use Lymphedema pumps on leg(s) 2-3 times a day for 45-60 minutes. If wearing any wraps or hose, do not remove them. Continue exercising as instructed. - 3 times a day. Elevate legs to the level of the heart or above for 30 minutes daily and/or when sitting, a frequency of: - 3-4 times Avoid standing for long periods of time. Exercise regularly Compression stocking or Garment 30-40 mm/Hg pressure to: - patient to apply extremit-ease to left leg in the morning and remove at night. patient to apply lotion to left leg every night after removing the compression garment. Wound Treatment Wound #1 - Lower Leg Wound Laterality: Right, Medial Peri-Wound Care: Sween Lotion (Moisturizing lotion) 1 x Per Week Discharge Instructions: Apply moisturizing lotion as  directed Peri-Wound Care: Triamcinolone 15 (g) 1 x Per Week Discharge Instructions: Use triamcinolone 15 (g) as directed. MIXED WITH LOTION. Prim Dressing: Maxorb Extra Calcium Alginate 2x2 in 1 x Per Week ary Discharge Instructions: Apply calcium alginate to wound bed as instructed Secondary Dressing: Woven Gauze Sponge, Non-Sterile 4x4 in 1 x Per Week Discharge Instructions: Apply over primary dressing as directed. Compression Wrap: FourPress (4 layer compression wrap) 1 x Per Week Discharge Instructions: Apply four layer compression as directed. APPLY UNNA BOOT FIRST LAYER TO UPPER PORTION OF LOWER LEG. Electronic Signature(s) Signed: 06/28/2020 5:13:40 PM By: Linton Ham MD Signed: 06/28/2020 6:08:01 PM By: Deon Pilling Entered By: Deon Pilling on 06/28/2020 13:23:06 -------------------------------------------------------------------------------- Problem List Details Patient Name: Date of Service: [name], [name] 06/28/2020 12:30 PM Medical Record Number: [id number] Patient Account Number: [id number] Date of Birth/Sex: Treating RN: [birth year] (75 y.o. [name] Primary Care Provider: [name] Other Clinician: Referring Provider: Treating Provider/Extender: Drucilla Schmidt Weeks in Treatment: 18 Active Problems ICD-10 Encounter Code Description Active Date MDM Diagnosis I89.0 Lymphedema, not elsewhere classified 02/23/2020 No Yes L97.812 Non-pressure chronic ulcer of other part of right lower leg with fat layer 02/23/2020 No Yes exposed I87.331 Chronic venous hypertension (idiopathic) with ulcer and inflammation of right 06/14/2020 No Yes lower extremity Inactive Problems ICD-10 Code Description Active  Date Inactive Date L97.822 Non-pressure chronic ulcer of other part of left lower leg with fat layer exposed 02/23/2020 02/23/2020 Resolved Problems Electronic Signature(s) Signed: 06/28/2020 5:13:40 PM By: Linton Ham MD Signed:  06/28/2020 6:08:01 PM By: Deon Pilling Entered By: Deon Pilling on 06/28/2020 13:21:54 -------------------------------------------------------------------------------- Progress Note Details Patient Name: Date of Service: [name], [name] 06/28/2020 12:30 PM Medical Record Number: [id number] Patient Account Number: [id number] Date of Birth/Sex: Treating RN: 09/15/1945 (75 y.o. [name] Primary Care Provider: [name] Other Clinician: Referring Provider: Treating Provider/Extender: Drucilla Schmidt Weeks in Treatment: 18 Subjective History of Present Illness (HPI) ADMISSION 02/23/2020 Patient is a 75 year old man who lives in [place] who arrives accompanied by his wife. He has a history of chronic lymphedema and venous insufficiency in his bilateral lower legs which may have something to do that with having a history of DVT as well as being treated for prostate cancer. In any case he recently got compression pumps at home but compliance has been an issue here. He has compression stockings however they are probably not sufficient enough to control swelling. They tell us that things deteriorated for him in late August he was admitted to [facility] for 7 days. This was with cellulitis I think of his bilateral lower legs. Discharge he was noted to have wounds on his bilateral lower legs. He was discharged on Bactrim. They tried to get him home health through [facility] of course they declined him. His wife is been wrapping these applying some form of silver foam dressing. He has a history of wounds before although nothing that would not heal with basic home topical dressings. He has 2 areas on the left medial, left anterior and left lateral and a smaller area on the right medial. All of these have considerable depth. Past medical history includes iron deficiency anemia, lymphedema followed by the rehab center at [facility] with lymphedema  wraps I believe, DVT on chronic anticoagulation, prostate cancer, chronic venous insufficiency, hypertension. As mentioned he has compression pumps but does not use them. ABIs in our clinic were noncompressible bilaterally 10/14; patient with severe bilateral lymphedema right greater than left. He came in with bilateral lower extremity wounds left greater than right. Even though the right side has more of the edema most of the wounds here almost closed on the right medial. He has 3 remaining wounds on the left We have been using silver alginate under 4-layer compression I have been trying to get him to be compliant with his external compression pumps 10/21; patient with 3 small wounds on the left leg and 1 on the right medial in the setting of severe lymphedema and chronic venous insufficiency. We have been using silver alginate under 4-layer compression he is using his external compression pumps twice a day 11/4; ARTERIAL STUDIES on the right show an ABI of 1.02 TBI of 0.858 with biphasic waveforms on the left 0.98 with a TBI of 0.55 and biphasic waveforms. Does not look like he has significant arterial disease. We are treating him for lymphedema he has compression pumps. He has punched-out areas on the left anterior left lateral and right medial lower extremities 11/11; after we obtained his arterial studies I put him in 4 layer compression. He is using his compression pumps probably once a day although I have asked him to do twice. Primary dressing to the wound is silver collagen he has severe lymphedema likely secondary to chronic venous insufficiency.  Wounds on the left lateral, left medial and left anterior and a small area on the right medial 12/2; the area on the right anterior lower leg has healed. We initially thought that the area medially had healed as well however when her discharge nurse came in she detected fluid in the wound simply opened up. This is actually worse than I remember  this pain. The area on the left lateral potentially slightly smaller He is also complaining about pain in his left hand he says that this is actually been getting some better he has been using topical creams on this. She asked that I look at this 12/9 after last weeks issues we have 2 wounds one on the right medial lower leg and 1 on the left lateral. Both of these are in the same condition. I think because of thickened skin secondary to chronic lymphedema these wounds actually have depth of almost 0.8 cm. 12/16; the patient has 2 small but deep wounds one on the right medial and one on the left lateral. The right medial is actually the worst of these. He arrives in clinic today with absolutely terrible edema in the right leg apparently his 4-layer wrap fell down to just above his ankle he did not think about this he is apparently been continuing to use his compression pump twice a day. The left leg looks a lot better. 05/09/2020 upon evaluation today patient appears to be doing decently well in regard to his wounds. Everything is measuring smaller the right leg still has a little bit deeper wound in the left seems to be almost completely healed in my opinion I am very pleased in general with how things are progressing. He has a 4- layer compression wrap we have been using endoform today we will probably have to use collagen just based on the fact that we do not have endoform it is on order. 1/6; the patient's wound on the left lateral lower leg has healed. Still has 1 on the right medial. 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 Using compression pumps at home twice a day. 1/13; left lateral lower leg is still healed. He has a deep punched out rectangular shaped wound on the right medial calf. Looking down at this it appears that he is attempting to epithelialize around the edges of the wound and on the base as well. His edema is reasonably well controlled we have been using  collagen with absolutely no effect 1/20; left lateral lower leg remains closed he has extremitease stockings. The area on the right medial calf I aggressively debrided last week measures larger but the surface looks better. We have been using Hydrofera Blue. We ran Oasis through his insurance but we have not seen the results of this 1/27; left lower leg wound with chronic venous insufficiency and secondary lymphedema. I did aggressive debridement on this last week the wound seems to have come in healthy looking surface using Hydrofera Blue. He was denied for Oasis 2/3; small divot in the right medial lower leg. Under illumination the walls of this divot are epithelialized however the base has slough which I removed with a curette we have been using Hydrofera Blue 2/10 small divot on the right medial lower leg pinpoint illumination at the base of this cone-shaped wound. We have been using Hydrofera Blue but I will switch to calcium alginate this week Objective Constitutional Patient is hypertensive.. Pulse regular and within target range for patient.Marland Kitchen Respirations regular, non-labored and within target  range.. Temperature is normal and within the target range for the patient.Marland Kitchen Appears in no distress. Vitals Time Taken: 1:02 PM, Height: 74 in, Weight: 240 lbs, BMI: 30.8, Temperature: 97.8 F, Pulse: 101 bpm, Respiratory Rate: 18 breaths/min, Blood Pressure: 179/77 mmHg. Cardiovascular Pedal pulses are palpable. We have good edema control. General Notes: Wound exam; a pinpoint area at the bottom of this cone-shaped wound. I think that is all that is left that is open the rest of this is epithelialized. Integumentary (Hair, Skin) Wound #1 status is Open. Original cause of wound was Blister. The wound is located on the Right,Medial Lower Leg. The wound measures 0.3cm length x 0.3cm width x 0.4cm depth; 0.071cm^2 area and 0.028cm^3 volume. There is Fat Layer (Subcutaneous Tissue) exposed. There is  no tunneling or undermining noted. There is a small amount of serous drainage noted. The wound margin is distinct with the outline attached to the wound base. There is large (67-100%) pink, pale granulation within the wound bed. There is no necrotic tissue within the wound bed. Assessment Active Problems ICD-10 Lymphedema, not elsewhere classified Non-pressure chronic ulcer of other part of right lower leg with fat layer exposed Chronic venous hypertension (idiopathic) with ulcer and inflammation of right lower extremity Procedures Wound #1 Pre-procedure diagnosis of Wound #1 is a Lymphedema located on the Right,Medial Lower Leg . There was a Four Layer Compression Therapy Procedure by Baruch Gouty, RN. Post procedure Diagnosis Wound #1: Same as Pre-Procedure Plan Follow-up Appointments: Return Appointment in 1 week. Bathing/ Shower/ Hygiene: May shower with protection but do not get wound dressing(s) wet. - use a cast protector. Edema Control - Lymphedema / SCD / Other: Lymphedema Pumps. Use Lymphedema pumps on leg(s) 2-3 times a day for 45-60 minutes. If wearing any wraps or hose, do not remove them. Continue exercising as instructed. - 3 times a day. Elevate legs to the level of the heart or above for 30 minutes daily and/or when sitting, a frequency of: - 3-4 times Avoid standing for long periods of time. Exercise regularly Compression stocking or Garment 30-40 mm/Hg pressure to: - patient to apply extremit-ease to left leg in the morning and remove at night. patient to apply lotion to left leg every night after removing the compression garment. WOUND #1: - Lower Leg Wound Laterality: Right, Medial Peri-Wound Care: Sween Lotion (Moisturizing lotion) 1 x Per Week/ Discharge Instructions: Apply moisturizing lotion as directed Peri-Wound Care: Triamcinolone 15 (g) 1 x Per Week/ Discharge Instructions: Use triamcinolone 15 (g) as directed. MIXED WITH LOTION. Prim Dressing: Maxorb  Extra Calcium Alginate 2x2 in 1 x Per Week/ ary Discharge Instructions: Apply calcium alginate to wound bed as instructed Secondary Dressing: Woven Gauze Sponge, Non-Sterile 4x4 in 1 x Per Week/ Discharge Instructions: Apply over primary dressing as directed. Com pression Wrap: FourPress (4 layer compression wrap) 1 x Per Week/ Discharge Instructions: Apply four layer compression as directed. APPLY UNNA BOOT FIRST LAYER TO UPPER PORTION OF LOWER LEG. 1. I change the primary dressing to calcium alginate still under 4-layer compression 2. Is to bring his extremitease stockings next week under the eventuality that this is closed Electronic Signature(s) Signed: 06/28/2020 5:13:40 PM By: Linton Ham MD Entered By: Linton Ham on 06/28/2020 13:33:10 -------------------------------------------------------------------------------- SuperBill Details Patient Name: Date of Service: [name], [name] 06/28/2020 Medical Record Number: [id number] Patient Account Number: [id number] Date of Birth/Sex: Treating RN: [birth year] (75 y.o. [name] Primary Care Provider: [name] Other Clinician: Referring Provider: Treating Provider/Extender: Dellia Nims  Kathryne Eriksson, [name] Weeks in Treatment: 18 Diagnosis Coding ICD-10 Codes Code Description I89.0 Lymphedema, not elsewhere classified Y71.580 Non-pressure chronic ulcer of other part of right lower leg with fat layer exposed I87.331 Chronic venous hypertension (idiopathic) with ulcer and inflammation of right lower extremity Facility Procedures CPT4 Code: [id number] Description: (Facility Use Only) [phone] - [place] COMPRS LWR RT LEG Modifier: Quantity: 1 Physician Procedures : CPT4 Code Description Modifier [id number] - WC PHYS LEVEL 3 - EST PT ICD-10 Diagnosis Description I89.0 Lymphedema, not elsewhere classified G87.199 Non-pressure chronic ulcer of other part of right lower leg with fat layer exposed I87.331 Chronic   venous hypertension (idiopathic) with ulcer and inflammation of right lower extremity Quantity: 1 Electronic Signature(s) Signed: 06/28/2020 5:13:40 PM By: Linton Ham MD Entered By: Linton Ham on 06/28/2020 13:33:28

## 2020-06-28 NOTE — Progress Notes (Signed)
Atchley, JOSUE ([id number]) Visit Report for 06/28/2020 Arrival Information Details Patient Name: Date of Service: [place], [name] 06/28/2020 12:30 PM Medical Record Number: [id number] Patient Account Number: [id number] Date of Birth/Sex: Treating RN: 10-30-1945 (75 y.o. Janyth Contes Primary Care Provider: [name] Other Clinician: Referring Provider: Treating Provider/Extender: Drucilla Schmidt Weeks in Treatment: 18 Visit Information History Since Last Visit Added or deleted any medications: No Patient Arrived: Ambulatory Any new allergies or adverse reactions: No Arrival Time: 13:02 Had a fall or experienced change in No Accompanied By: alone activities of daily living that may affect Transfer Assistance: None risk of falls: Patient Identification Verified: Yes Signs or symptoms of abuse/neglect since last visito No Secondary Verification Process Completed: Yes Hospitalized since last visit: No Patient Requires Transmission-Based Precautions: No Implantable device outside of the clinic excluding No Patient Has Alerts: Yes cellular tissue based products placed in the center Patient Alerts: Patient on Blood Thinner since last visit: on warfarin Has Dressing in Place as Prescribed: Yes Bilateral ABI: [place] Has Compression in Place as Prescribed: Yes Pain Present Now: No Electronic Signature(s) Signed: 06/28/2020 5:58:12 PM By: Levan Hurst RN, [name] Entered By: Levan Hurst on 06/28/2020 13:02:47 -------------------------------------------------------------------------------- Compression Therapy Details Patient Name: Date of Service: Ros[place], [name] 06/28/2020 12:30 PM Medical Record Number: [id number] Patient Account Number: [id number] Date of Birth/Sex: Treating RN: 09-Oct-1945 (74 y.o. [name] Primary Care Provider: [name] Other Clinician: Referring Provider: Treating Provider/Extender: Drucilla Schmidt Weeks  in Treatment: 18 Compression Therapy Performed for Wound Assessment: Wound #1 Right,Medial Lower Leg Performed By: Clinician Baruch Gouty, RN Compression Type: Four Layer Post Procedure Diagnosis Same as Pre-procedure Electronic Signature(s) Signed: 06/28/2020 6:08:01 PM By: Deon Pilling Entered By: Deon Pilling on 06/28/2020 13:22:09 -------------------------------------------------------------------------------- Encounter Discharge Information Details Patient Name: Date of Service: [place], [name] 06/28/2020 12:30 PM Medical Record Number: [id number] Patient Account Number: [id number] Date of Birth/Sex: Treating RN: 12-28-1945 (75 y.o. [name] Primary Care Provider: [name] Other Clinician: Referring Provider: Treating Provider/Extender: Alycia Rossetti in Treatment: 18 Encounter Discharge Information Items Discharge Condition: Stable Ambulatory Status: Ambulatory Discharge Destination: Home Transportation: Private Auto Accompanied By: self Schedule Follow-up Appointment: Yes Clinical Summary of Care: Patient Declined Electronic Signature(s) Signed: 06/28/2020 5:55:42 PM By: Baruch Gouty RN, [name] Entered By: Baruch Gouty on 06/28/2020 16:08:41 -------------------------------------------------------------------------------- Lower Extremity Assessment Details Patient Name: Date of Service: [place], [name] 06/28/2020 12:30 PM Medical Record Number: [id number] Patient Account Number: [id number] Date of Birth/Sex: Treating RN: Nov 20, 1945 (75 y.o. Janyth Contes Primary Care Provider: [name] Other Clinician: Referring Provider: Treating Provider/Extender: Drucilla Schmidt Weeks in Treatment: 18 Edema Assessment Assessed: [name]: No] [name]: No] Edema: [Left: Ye] [Right: s] Calf Left: Right: Point of Measurement: 46 cm From Medial Instep 41 cm Ankle Left: Right: Point of Measurement: 15 cm  From Medial Instep 33 cm Vascular Assessment Pulses: Dorsalis Pedis Palpable: [Right:Yes] Electronic Signature(s) Signed: 06/28/2020 5:58:12 PM By: Levan Hurst RN, [name] Entered By: Levan Hurst on 06/28/2020 13:15:02 -------------------------------------------------------------------------------- [place] Details Patient Name: Date of Service: Krupinski, [name] 06/28/2020 12:30 PM Medical Record Number: [id number] Patient Account Number: [id number] Date of Birth/Sex: Treating RN: 1945-09-02 (74 y.o. [name] Primary Care Provider: [name] Other Clinician: Referring Provider: Treating Provider/Extender: Drucilla Schmidt Weeks in Treatment: 18 Active Inactive Pain, Acute or Chronic Nursing Diagnoses: Pain, acute or chronic: actual or  potential Potential alteration in comfort, pain Goals: Patient will verbalize adequate pain control and receive pain control interventions during procedures as needed Date Initiated: 02/23/2020 Target Resolution Date: 07/13/2020 Goal Status: Active Patient/caregiver will verbalize comfort level met Date Initiated: 02/23/2020 Date Inactivated: 03/22/2020 Target Resolution Date: 03/23/2020 Goal Status: Met Interventions: Provide education on pain management Reposition patient for comfort Notes: Wound/Skin Impairment Nursing Diagnoses: Knowledge deficit related to ulceration/compromised skin integrity Goals: Ulcer/skin breakdown will heal within 14 weeks Date Initiated: 02/23/2020 Target Resolution Date: 07/12/2020 Goal Status: Active Interventions: Assess patient/caregiver ability to obtain necessary supplies Assess patient/caregiver ability to perform ulcer/skin care regimen upon admission and as needed Provide education on ulcer and skin care Treatment Activities: Skin care regimen initiated : 02/23/2020 Topical wound management initiated : 02/23/2020 Notes: Electronic Signature(s) Signed:  06/28/2020 6:08:01 PM By: Deon Pilling Entered By: Deon Pilling on 06/28/2020 13:23:12 -------------------------------------------------------------------------------- Pain Assessment Details Patient Name: Date of Service: [place], [name] 06/28/2020 12:30 PM Medical Record Number: [id number] Patient Account Number: [id number] Date of Birth/Sex: Treating RN: 07-22-1945 (75 y.o. Janyth Contes Primary Care Provider: Other Clinician: [name] Referring Provider: Treating Provider/Extender: Drucilla Schmidt Weeks in Treatment: 18 Active Problems Location of Pain Severity and Description of Pain Patient Has Paino No Site Locations Pain Management and Medication Current Pain Management: Electronic Signature(s) Signed: 06/28/2020 5:58:12 PM By: Levan Hurst RN, [name] Entered By: Levan Hurst on 06/28/2020 13:03:16 -------------------------------------------------------------------------------- Patient/Caregiver Education Details Patient Name: Date of Service: [place], [name] 2/10/2022andnbsp12:30 PM Medical Record Number: [id number] Patient Account Number: [id number] Date of Birth/Gender: Treating RN: Feb 23, 1946 (75 y.o. [name] Primary Care Physician: [name] Other Clinician: Referring Physician: Treating Physician/Extender: Alycia Rossetti in Treatment: 39 Education Assessment Education Provided To: Patient Education Topics Provided Pain: Handouts: A Guide to Pain Control Methods: Explain/Verbal Responses: Reinforcements needed Electronic Signature(s) Signed: 06/28/2020 6:08:01 PM By: Deon Pilling Entered By: Deon Pilling on 06/28/2020 13:23:24 -------------------------------------------------------------------------------- Wound Assessment Details Patient Name: Date of Service: [place], [name] 06/28/2020 12:30 PM Medical Record Number: [id number] Patient Account Number: [id number] Date of  Birth/Sex: Treating RN: 22-Jun-1945 (75 y.o. Janyth Contes Primary Care Provider: [name] Other Clinician: Referring Provider: Treating Provider/Extender: Drucilla Schmidt Weeks in Treatment: 18 Wound Status Wound Number: 1 Primary Lymphedema Etiology: Wound Location: Right, Medial Lower Leg Wound Status: Open Wounding Event: Blister Comorbid Lymphedema, Deep Vein Thrombosis, Hypertension, Date Acquired: 01/15/2020 History: Received Radiation Weeks Of Treatment: 18 Clustered Wound: No Wound Measurements Length: (cm) 0.3 Width: (cm) 0.3 Depth: (cm) 0.4 Area: (cm) 0.071 Volume: (cm) 0.028 % Reduction in Area: 91% % Reduction in Volume: 64.6% Epithelialization: Large (67-100%) Tunneling: No Undermining: No Wound Description Classification: Full Thickness Without Exposed Support Structures Wound Margin: Distinct, outline attached Exudate Amount: Small Exudate Type: Serous Exudate Color: amber Foul Odor After Cleansing: No Slough/Fibrino No Wound Bed Granulation Amount: Large (67-100%) Exposed Structure Granulation Quality: Pink, Pale Fascia Exposed: No Necrotic Amount: None Present (0%) Fat Layer (Subcutaneous Tissue) Exposed: Yes Tendon Exposed: No Muscle Exposed: No Joint Exposed: No Bone Exposed: No Treatment Notes Wound #1 (Lower Leg) Wound Laterality: Right, Medial Cleanser Peri-Wound Care Sween Lotion (Moisturizing lotion) Discharge Instruction: Apply moisturizing lotion as directed Triamcinolone 15 (g) Discharge Instruction: Use triamcinolone 15 (g) as directed. MIXED WITH LOTION. Topical Primary Dressing Maxorb Extra Calcium Alginate 2x2 in Discharge Instruction: Apply calcium alginate to wound bed as instructed Secondary Dressing Woven Gauze Sponge, Non-Sterile 4x4 in  Discharge Instruction: Apply over primary dressing as directed. Secured With Compression Wrap FourPress (4 layer compression wrap) Discharge Instruction:  Apply four layer compression as directed. APPLY UNNA BOOT FIRST LAYER TO UPPER PORTION OF LOWER LEG. Compression Stockings Add-Ons Electronic Signature(s) Signed: 06/28/2020 5:58:12 PM By: Levan Hurst RN, [name] Entered By: Levan Hurst on 06/28/2020 13:14:29 -------------------------------------------------------------------------------- [place] Details Patient Name: Date of Service: Mcclellan, [name] 06/28/2020 12:30 PM Medical Record Number: [id number] Patient Account Number: [id number] Date of Birth/Sex: Treating RN: 10-14-1945 (75 y.o. Janyth Contes Primary Care Rowena Moilanen: [name] Other Clinician: Referring Vaanya Shambaugh: Treating Fletcher Ostermiller/Extender: Drucilla Schmidt Weeks in Treatment: 18 Vital Signs Time Taken: 13:02 Temperature (F): 97.8 Height (in): 74 Pulse (bpm): 101 Weight (lbs): 240 Respiratory Rate (breaths/min): 18 Body Mass Index (BMI): 30.8 Blood Pressure (mmHg): 179/77 Reference Range: 80 - 120 mg / dl Electronic Signature(s) Signed: 06/28/2020 5:58:12 PM By: Levan Hurst RN, [name] Entered By: Levan Hurst on 06/28/2020 13:03:11

## 2020-07-05 ENCOUNTER — Other Ambulatory Visit: Payer: Self-pay

## 2020-07-05 ENCOUNTER — Encounter (HOSPITAL_BASED_OUTPATIENT_CLINIC_OR_DEPARTMENT_OTHER): Payer: Medicare Other | Admitting: Internal Medicine

## 2020-07-05 DIAGNOSIS — L97812 Non-pressure chronic ulcer of other part of right lower leg with fat layer exposed: Secondary | ICD-10-CM | POA: Diagnosis not present

## 2020-07-05 NOTE — Progress Notes (Signed)
Hoen, FELIBERTO ([id number]) Visit Report for 07/05/2020 Arrival Information Details Patient Name: Date of Service: Janus, [name] 07/05/2020 2:00 PM Medical Record Number: [id number] Patient Account Number: [id number] Date of Birth/Sex: Treating RN: 1945-09-24 (75 y.o. [name] Primary Care Lonna Rabold: [name] Other Clinician: Referring Adair Lauderback: Treating Bairon Klemann/Extender: Alycia Rossetti in Treatment: 19 Visit Information History Since Last Visit Added or deleted any medications: No Patient Arrived: Ambulatory Any new allergies or adverse reactions: No Arrival Time: 14:14 Had a fall or experienced change in No Accompanied By: self activities of daily living that may affect Transfer Assistance: None risk of falls: Patient Identification Verified: Yes Signs or symptoms of abuse/neglect since last visito No Secondary Verification Process Completed: Yes Hospitalized since last visit: No Patient Requires Transmission-Based Precautions: No Implantable device outside of the clinic excluding No Patient Has Alerts: Yes cellular tissue based products placed in the center Patient Alerts: Patient on Blood Thinner since last visit: on warfarin Has Dressing in Place as Prescribed: Yes Bilateral ABI: [place] Has Compression in Place as Prescribed: Yes Pain Present Now: No Electronic Signature(s) Signed: 07/05/2020 6:31:13 PM By: Baruch Gouty RN, [name] Entered By: Baruch Gouty on 07/05/2020 14:16:29 -------------------------------------------------------------------------------- Clinic Level of Care Assessment Details Patient Name: Date of Service: Larsson, [place] 07/05/2020 2:00 PM Medical Record Number: [id number] Patient Account Number: [id number] Date of Birth/Sex: Treating RN: [birth year] (75 y.o. [name], [name] Primary Care Shatiqua Heroux: [name] Other Clinician: Referring Stepheny Canal: Treating Toni Demo/Extender: Drucilla Schmidt Weeks in Treatment: 19 Clinic Level of Care Assessment Items TOOL 4 Quantity Score X- 1 0 Use when only an EandM is performed on FOLLOW-UP visit ASSESSMENTS - Nursing Assessment / Reassessment X- 1 10 Reassessment of Co-morbidities (includes updates in patient status) X- 1 5 Reassessment of Adherence to Treatment Plan ASSESSMENTS - Wound and Skin A ssessment / Reassessment X - Simple Wound Assessment / Reassessment - one wound 1 5 []  - 0 Complex Wound Assessment / Reassessment - multiple wounds X- 1 10 Dermatologic / Skin Assessment (not related to wound area) ASSESSMENTS - Focused Assessment X- 1 5 Circumferential Edema Measurements - multi extremities X- 1 10 Nutritional Assessment / Counseling / Intervention []  - 0 Lower Extremity Assessment (monofilament, tuning fork, pulses) []  - 0 Peripheral Arterial Disease Assessment (using hand held doppler) ASSESSMENTS - Ostomy and/or Continence Assessment and Care []  - 0 Incontinence Assessment and Management []  - 0 Ostomy Care Assessment and Management (repouching, etc.) PROCESS - Coordination of Care X - Simple Patient / Family Education for ongoing care 1 15 []  - 0 Complex (extensive) Patient / Family Education for ongoing care X- 1 10 Staff obtains Programmer, systems, Records, T Results / Process Orders est []  - 0 Staff telephones HHA, Nursing Homes / Clarify orders / etc []  - 0 Routine Transfer to another Facility (non-emergent condition) []  - 0 Routine Hospital Admission (non-emergent condition) []  - 0 New Admissions / Biomedical engineer / Ordering NPWT Apligraf, etc. , []  - 0 Emergency Hospital Admission (emergent condition) X- 1 10 Simple Discharge Coordination []  - 0 Complex (extensive) Discharge Coordination PROCESS - Special Needs []  - 0 Pediatric / Minor Patient Management []  - 0 Isolation Patient Management []  - 0 Hearing / Language / Visual special needs []  - 0 Assessment of Community assistance  (transportation, D/C planning, etc.) []  - 0 Additional assistance / Altered mentation []  - 0 Support Surface(s) Assessment (bed, cushion, seat, etc.) INTERVENTIONS - Wound Cleansing / Measurement  X - Simple Wound Cleansing - one wound 1 5 []  - 0 Complex Wound Cleansing - multiple wounds X- 1 5 Wound Imaging (photographs - any number of wounds) []  - 0 Wound Tracing (instead of photographs) X- 1 5 Simple Wound Measurement - one wound []  - 0 Complex Wound Measurement - multiple wounds INTERVENTIONS - Wound Dressings []  - 0 Small Wound Dressing one or multiple wounds []  - 0 Medium Wound Dressing one or multiple wounds []  - 0 Large Wound Dressing one or multiple wounds []  - 0 Application of Medications - topical []  - 0 Application of Medications - injection INTERVENTIONS - Miscellaneous []  - 0 External ear exam []  - 0 Specimen Collection (cultures, biopsies, blood, body fluids, etc.) []  - 0 Specimen(s) / Culture(s) sent or taken to Lab for analysis []  - 0 Patient Transfer (multiple staff / Civil Service fast streamer / Similar devices) []  - 0 Simple Staple / Suture removal (25 or less) []  - 0 Complex Staple / Suture removal (26 or more) []  - 0 Hypo / Hyperglycemic Management (close monitor of Blood Glucose) []  - 0 Ankle / Brachial Index (ABI) - do not check if billed separately X- 1 5 Vital Signs Has the patient been seen at the hospital within the last three years: Yes Total Score: 100 Level Of Care: New/Established - Level 3 Electronic Signature(s) Signed: 07/05/2020 5:51:10 PM By: Deon Pilling Entered By: Deon Pilling on 07/05/2020 14:57:44 -------------------------------------------------------------------------------- Encounter Discharge Information Details Patient Name: Date of Service: [name], [name] 07/05/2020 2:00 PM Medical Record Number: [id number] Patient Account Number: [id number] Date of Birth/Sex: Treating RN: 05-17-1946 (75 y.o. [name] Primary Care  Khadija Thier: [name] Other Clinician: Referring Emberlynn Riggan: Treating Cesia Orf/Extender: Drucilla Schmidt Weeks in Treatment: 19 Encounter Discharge Information Items Discharge Condition: Stable Ambulatory Status: Ambulatory Discharge Destination: Home Transportation: Private Auto Accompanied By: self Schedule Follow-up Appointment: Yes Clinical Summary of Care: Patient Declined Electronic Signature(s) Signed: 07/05/2020 6:31:13 PM By: Baruch Gouty RN, [name] Entered By: Baruch Gouty on 07/05/2020 15:12:24 -------------------------------------------------------------------------------- Lower Extremity Assessment Details Patient Name: Date of Service: [place], [name] 07/05/2020 2:00 PM Medical Record Number: [id number] Patient Account Number: [id number] Date of Birth/Sex: Treating RN: Sep 20, 1945 (75 y.o. [name] Primary Care Aalia Greulich: [name] Other Clinician: Referring Notnamed Scholz: Treating Shanice Poznanski/Extender: Drucilla Schmidt Weeks in Treatment: 19 Edema Assessment Assessed: [name]: No] [name]: No] Edema: [Left: Ye] [Right: s] Calf Left: Right: Point of Measurement: 46 cm From Medial Instep 42 cm Ankle Left: Right: Point of Measurement: 15 cm From Medial Instep 33 cm Vascular Assessment Pulses: Dorsalis Pedis Palpable: [Right:Yes] Electronic Signature(s) Signed: 07/05/2020 6:31:13 PM By: Baruch Gouty RN, [name] Entered By: Baruch Gouty on 07/05/2020 14:24:19 -------------------------------------------------------------------------------- Multi Wound Chart Details Patient Name: Date of Service: Hartel, [name] 07/05/2020 2:00 PM Medical Record Number: [id number] Patient Account Number: [id number] Date of Birth/Sex: Treating RN: 1945-09-15 (74 y.o. [name] Primary Care Zehava Turski: [name] Other Clinician: Referring Rilei Kravitz: Treating Damichael Hofman/Extender: Drucilla Schmidt Weeks in  Treatment: 19 Vital Signs Height(in): 74 Pulse(bpm): 44 Weight(lbs): 240 Blood Pressure(mmHg): 169/72 Body Mass Index(BMI): 31 Temperature(F): 98.1 Respiratory Rate(breaths/min): 18 Photos: [1:No Photos Right, Medial Lower Leg] [N/A:N/A N/A] Wound Location: [1:Blister] [N/A:N/A] Wounding Event: [1:Lymphedema] [N/A:N/A] Primary Etiology: [1:Lymphedema, Deep Vein Thrombosis,] [N/A:N/A] Comorbid History: [1:Hypertension, Received Radiation 01/15/2020] [N/A:N/A] Date Acquired: [1:19] [N/A:N/A] Weeks of Treatment: [1:Open] [N/A:N/A] Wound Status: [1:0x0x0] [N/A:N/A] Measurements L x W x D (cm) [1:0] [N/A:N/A] A (  cm) : rea [1:0] [N/A:N/A] Volume (cm) : [1:100.00%] [N/A:N/A] % Reduction in Area: [1:100.00%] [N/A:N/A] % Reduction in Volume: [1:Full Thickness Without Exposed] [N/A:N/A] Classification: [1:Support Structures Small] [N/A:N/A] Exudate Amount: [1:Serous] [N/A:N/A] Exudate Type: [1:amber] [N/A:N/A] Exudate Color: [1:Distinct, outline attached] [N/A:N/A] Wound Margin: [1:None Present (0%)] [N/A:N/A] Granulation Amount: [1:None Present (0%)] [N/A:N/A] Necrotic Amount: [1:Fascia: No] [N/A:N/A] Exposed Structures: [1:Fat Layer (Subcutaneous Tissue): No Tendon: No Muscle: No Joint: No Bone: No Large (67-100%)] [N/A:N/A] Treatment Notes Wound #1 (Lower Leg) Wound Laterality: Right, Medial Cleanser Peri-Wound Care Topical Primary Dressing Secondary Dressing Secured With Compression Wrap Compression Stockings Add-Ons Notes extremit-ease applied Electronic Signature(s) Signed: 07/05/2020 5:29:55 PM By: Linton Ham MD Signed: 07/05/2020 5:51:10 PM By: Deon Pilling Entered By: Linton Ham on 07/05/2020 15:25:38 -------------------------------------------------------------------------------- Multi-Disciplinary Care Plan Details Patient Name: Date of Service: Despain, [name] 07/05/2020 2:00 PM Medical Record Number: [id number] Patient Account Number:  [id number] Date of Birth/Sex: Treating RN: 24-Oct-1945 (75 y.o. [name] Primary Care Jaquelyn Sakamoto: [name] Other Clinician: Referring Endi Lagman: Treating Shawnese Magner/Extender: Drucilla Schmidt Weeks in Treatment: 66 Active Inactive Electronic Signature(s) Signed: 07/05/2020 5:51:10 PM By: Deon Pilling Entered By: Deon Pilling on 07/05/2020 14:56:55 -------------------------------------------------------------------------------- Pain Assessment Details Patient Name: Date of Service: Pryde, [name] 07/05/2020 2:00 PM Medical Record Number: [id number] Patient Account Number: [id number] Date of Birth/Sex: Treating RN: 01/28/1946 (75 y.o. [name] Primary Care Lynzee Lindquist: [name] Other Clinician: Referring Dessire Grimes: Treating Merary Garguilo/Extender: Drucilla Schmidt Weeks in Treatment: 19 Active Problems Location of Pain Severity and Description of Pain Patient Has Paino No Site Locations Rate the pain. Rate the pain. Current Pain Level: 0 Pain Management and Medication Current Pain Management: Electronic Signature(s) Signed: 07/05/2020 6:31:13 PM By: Baruch Gouty RN, [name] Entered By: Baruch Gouty on 07/05/2020 14:17:20 -------------------------------------------------------------------------------- Patient/Caregiver Education Details Patient Name: Date of Service: Henningsen, [name] 2/17/2022andnbsp2:00 PM Medical Record Number: [id number] Patient Account Number: [id number] Date of Birth/Gender: Treating RN: 11-21-1945 (75 y.o. [name] Primary Care Physician: [name] Other Clinician: Referring Physician: Treating Physician/Extender: Alycia Rossetti in Treatment: 82 Education Assessment Education Provided To: Patient Education Topics Provided Wound/Skin Impairment: Handouts: Skin Care Do's and Dont's Methods: Explain/Verbal Responses: Reinforcements needed Electronic  Signature(s) Signed: 07/05/2020 5:51:10 PM By: Deon Pilling Entered By: Deon Pilling on 07/05/2020 14:57:09 -------------------------------------------------------------------------------- Wound Assessment Details Patient Name: Date of Service: [place], [name] 07/05/2020 2:00 PM Medical Record Number: [id number] Patient Account Number: [id number] Date of Birth/Sex: Treating RN: 03-29-1946 (75 y.o. [name] Primary Care Jianni Batten: [name] Other Clinician: Referring Vianny Schraeder: Treating Delford Wingert/Extender: Drucilla Schmidt Weeks in Treatment: 19 Wound Status Wound Number: 1 Primary Lymphedema Etiology: Wound Location: Right, Medial Lower Leg Wound Status: Open Wounding Event: Blister Comorbid Lymphedema, Deep Vein Thrombosis, Hypertension, Date Acquired: 01/15/2020 History: Received Radiation Weeks Of Treatment: 19 Clustered Wound: No Wound Measurements Length: (cm) Width: (cm) Depth: (cm) Area: (cm) Volume: (cm) 0 % Reduction in Area: 100% 0 % Reduction in Volume: 100% 0 Epithelialization: Large (67-100%) 0 Tunneling: No 0 Undermining: No Wound Description Classification: Full Thickness Without Exposed Support Structures Wound Margin: Distinct, outline attached Exudate Amount: Small Exudate Type: Serous Exudate Color: amber Foul Odor After Cleansing: No Slough/Fibrino No Wound Bed Granulation Amount: None Present (0%) Exposed Structure Necrotic Amount: None Present (0%) Fascia Exposed: No Fat Layer (Subcutaneous Tissue) Exposed: No Tendon Exposed: No Muscle Exposed: No Joint Exposed: No Bone Exposed: No Electronic Signature(s)  Signed: 07/05/2020 6:31:13 PM By: Baruch Gouty RN, [name] Entered By: Baruch Gouty on 07/05/2020 14:26:07 -------------------------------------------------------------------------------- [place] Details Patient Name: Date of Service: Kreps, [name] 07/05/2020 2:00 PM Medical Record Number:  [id number] Patient Account Number: [id number] Date of Birth/Sex: Treating RN: 1946/02/08 (75 y.o. [name] Primary Care Zamani Crocker: [name] Other Clinician: Referring Matilynn Dacey: Treating Glenola Wheat/Extender: Drucilla Schmidt Weeks in Treatment: 19 Vital Signs Time Taken: 14:15 Temperature (F): 98.1 Height (in): 74 Pulse (bpm): 89 Source: Stated Respiratory Rate (breaths/min): 18 Weight (lbs): 240 Blood Pressure (mmHg): 169/72 Source: Stated Reference Range: 80 - 120 mg / dl Body Mass Index (BMI): 30.8 Electronic Signature(s) Signed: 07/05/2020 6:31:13 PM By: Baruch Gouty RN, [name] Entered By: Baruch Gouty on 07/05/2020 14:16:58

## 2020-07-05 NOTE — Progress Notes (Signed)
[name], [name] ([id number]) Visit Report for 07/05/2020 HPI Details Patient Name: Date of Service: [name], [name] 07/05/2020 2:00 PM Medical Record Number: [id number] Patient Account Number: [id number] Date of Birth/Sex: Treating RN: 11-07-1945 (75 y.o. [name] Primary Care Provider: [name] Other Clinician: Referring Provider: Treating Provider/Extender: Drucilla Schmidt Weeks in Treatment: 60 History of Present Illness HPI Description: ADMISSION 02/23/2020 Patient is a 75 year old man who lives in [place] who arrives accompanied by his wife. He has a history of chronic lymphedema and venous insufficiency in his bilateral lower legs which may have something to do that with having a history of DVT as well as being treated for prostate cancer. In any case he recently got compression pumps at home but compliance has been an issue here. He has compression stockings however they are probably not sufficient enough to control swelling. They tell us that things deteriorated for him in late August he was admitted to [facility] for 7 days. This was with cellulitis I think of his bilateral lower legs. Discharge he was noted to have wounds on his bilateral lower legs. He was discharged on Bactrim. They tried to get him home health through [facility] part [facility] of course they declined him. His wife is been wrapping these applying some form of silver foam dressing. He has a history of wounds before although nothing that would not heal with basic home topical dressings. He has 2 areas on the left medial, left anterior and left lateral and a smaller area on the right medial. All of these have considerable depth. Past medical history includes iron deficiency anemia, lymphedema followed by the rehab center at [facility] with lymphedema wraps I believe, DVT on chronic anticoagulation, prostate cancer, chronic venous insufficiency, hypertension. As mentioned he has  compression pumps but does not use them. ABIs in our clinic were noncompressible bilaterally 10/14; patient with 77 severe bilateral lymphedema right greater than left. He came in with bilateral lower extremity wounds left greater than right. Even though the right side has more of the edema most of the wounds here almost closed on the right medial. He has 3 remaining wounds on the left We have been using silver alginate under 4-layer compression I have been trying to get him to be compliant with his external compression pumps 10/21; patient with 77 small wounds on the left leg and 1 on the right medial in the setting of severe lymphedema and chronic venous insufficiency. We have been using silver alginate under 4-layer compression he is using his external compression pumps twice a day 11/4; ARTERIAL STUDIES on the right show 77 an ABI of 1.02 TBI of 0.858 with biphasic waveforms on the left 0.98 with a TBI of 0.55 and biphasic waveforms. Does not look like he has significant arterial disease. We are treating him for lymphedema he has compression pumps. He has punched-out areas on the left anterior left lateral and right medial lower extremities 11/11; after we obtained his arterial studies I put him in 4 layer compression. He is using his compression pumps probably once a day although I have asked him to do twice. Primary dressing to the wound is silver collagen he has severe lymphedema likely secondary to chronic venous insufficiency. Wounds on the left lateral, left medial and left anterior and a small area on the right medial 12/2; the area on the right anterior lower leg has healed. We initially thought that the area medially had healed as well however when  her discharge nurse came in she detected fluid in the wound simply opened up. This is actually worse than I remember this pain. The area on the left lateral potentially slightly smaller He is also complaining about pain in his left hand he says that  this is actually been getting some better he has been using topical creams on this. She asked that I look at this 12/9 after last weeks issues we have 2 wounds one on the right medial lower leg and 1 on the left lateral. Both of these are in the same condition. I think because of thickened skin secondary to chronic lymphedema these wounds actually have depth of almost 0.8 cm. 12/16; the patient has 2 small but deep wounds one on the right medial and one on the left lateral. The right medial is actually the worst of these. He arrives in clinic today with absolutely terrible edema in the right leg apparently his 4-layer wrap fell down to just above his ankle he did not think about this he is apparently been continuing to use his compression pump twice a day. The left leg looks a lot better. 05/09/2020 upon evaluation today patient appears to be doing decently well in regard to his wounds. Everything is measuring smaller the right leg still has a little bit deeper wound in the left seems to be almost completely healed in my opinion I am very pleased in general with how things are progressing. He has a 4- layer compression wrap we have been using endoform today we will probably have to use collagen just based on the fact that we do not have endoform it is on order. 1/6; the patient's wound on the left lateral lower leg has healed. Still has 1 on the right medial. 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 Using compression pumps at home twice a day. 1/13; left lateral lower leg is still healed. He has a deep punched out rectangular shaped wound on the right medial calf. Looking down at this it appears that he is attempting to epithelialize around the edges of the wound and on the base as well. His edema is reasonably well controlled we have been using collagen with absolutely no effect 1/20; left lateral lower leg remains closed he has extremitease stockings. The area on the right medial  calf I aggressively debrided last week measures larger but the surface looks better. We have been using Hydrofera Blue. We ran Oasis through his insurance but we have not seen the results of this 1/27; left lower leg wound with chronic venous insufficiency and secondary lymphedema. I did aggressive debridement on this last week the wound seems to have come in healthy looking surface using Hydrofera Blue. He was denied for Oasis 2/3; small divot in the right medial lower leg. Under illumination the walls of this divot are epithelialized however the base has slough which I removed with a curette we have been using Hydrofera Blue 2/10 small divot on the right medial lower leg pinpoint illumination at the base of this cone-shaped wound. We have been using Hydrofera Blue but I will switch to calcium alginate this week 2/17; the small divot on the right medial lower leg is fully epithelialized. There is no visible open area under illumination. He has his own stocking for the right leg similar to the one he has been wearing on the left. Electronic Signature(s) Signed: 07/05/2020 5:29:55 PM By: Linton Ham MD Entered By: Linton Ham on 07/05/2020 15:26:20 -------------------------------------------------------------------------------- Physical Exam  Details Patient Name: Date of Service: [name], [name] 07/05/2020 2:00 PM Medical Record Number: [id number] Patient Account Number: [id number] Date of Birth/Sex: Treating RN: November 30, 1945 (75 y.o. [name] Primary Care Provider: [name] Other Clinician: Referring Provider: Treating Provider/Extender: Drucilla Schmidt Weeks in Treatment: 24 Constitutional Patient is hypertensive.. Pulse regular and within target range for patient.Marland Kitchen Respirations regular, non-labored and within target range.. Temperature is normal and within the target range for the patient.Marland Kitchen Appears in no distress. Notes Wound exam; pinpoint area at  the bottom of the cone-shaped divot in the right medial lower leg is totally closed there is no open area here. He has excellent edema control Electronic Signature(s) Signed: 07/05/2020 5:29:55 PM By: Linton Ham MD Entered By: Linton Ham on 07/05/2020 15:27:05 -------------------------------------------------------------------------------- Physician Orders Details Patient Name: Date of Service: [name], [name] 07/05/2020 2:00 PM Medical Record Number: [id number] Patient Account Number: [id number] Date of Birth/Sex: Treating RN: Jun 28, 1945 (75 y.o. [name] Primary Care Provider: [name] Other Clinician: Referring Provider: Treating Provider/Extender: Alycia Rossetti in Treatment: 63 Verbal / Phone Orders: No Diagnosis Coding Discharge From [facility] Services Discharge from [place] - Call if any future wound care needs. Edema Control - Lymphedema / SCD / Other Lymphedema Pumps. Use Lymphedema pumps on leg(s) 2-3 times a day for 45-60 minutes. If wearing any wraps or hose, do not remove them. Continue exercising as instructed. Elevate legs to the level of the heart or above for 30 minutes daily and/or when sitting, a frequency of: Avoid standing for long periods of time. Patient to wear own compression stockings every day. - Extreme Ease to both legs 30-83mmHg. Apply first thing in the morning and remove at night. Exercise regularly Moisturize legs daily. - every night before bed to both legs. Wound Treatment Electronic Signature(s) Signed: 07/05/2020 5:29:55 PM By: Linton Ham MD Signed: 07/05/2020 5:51:10 PM By: Deon Pilling Entered By: Deon Pilling on 07/05/2020 14:56:44 -------------------------------------------------------------------------------- Problem List Details Patient Name: Date of Service: [name], [name] 07/05/2020 2:00 PM Medical Record Number: [id number] Patient Account Number: [id number] Date of  Birth/Sex: Treating RN: 06-25-1945 (75 y.o. [name] Primary Care Provider: [name] Other Clinician: Referring Provider: Treating Provider/Extender: Drucilla Schmidt Weeks in Treatment: 19 Active Problems ICD-10 Encounter Code Description Active Date MDM Diagnosis I89.0 Lymphedema, not elsewhere classified 02/23/2020 No Yes L97.812 Non-pressure chronic ulcer of other part of right lower leg with fat layer 02/23/2020 No Yes exposed I87.331 Chronic venous hypertension (idiopathic) with ulcer and inflammation of right 06/14/2020 No Yes lower extremity Inactive Problems ICD-10 Code Description Active Date Inactive Date L97.822 Non-pressure chronic ulcer of other part of left lower leg with fat layer exposed 02/23/2020 02/23/2020 Resolved Problems Electronic Signature(s) Signed: 07/05/2020 5:29:55 PM By: Linton Ham MD Entered By: Linton Ham on 07/05/2020 15:25:33 -------------------------------------------------------------------------------- Progress Note Details Patient Name: Date of Service: [name], [name] 07/05/2020 2:00 PM Medical Record Number: [id number] Patient Account Number: [id number] Date of Birth/Sex: Treating RN: [birth year] (74 y.o. [name] Primary Care Provider: [name] Other Clinician: Referring Provider: Treating Provider/Extender: Drucilla Schmidt Weeks in Treatment: 19 Subjective History of Present Illness (HPI) ADMISSION 02/23/2020 Patient is a 75 year old man who lives in [place] who arrives accompanied by his wife. He has a history of chronic lymphedema and venous insufficiency in his bilateral lower legs which may have something to do that with having a history of  DVT as well as being treated for prostate cancer. In any case he recently got compression pumps at home but compliance has been an issue here. He has compression stockings however they are probably not sufficient enough to  control swelling. They tell us that things deteriorated for him in late August he was admitted to [facility] for 7 days. This was with cellulitis I think of his bilateral lower legs. Discharge he was noted to have wounds on his bilateral lower legs. He was discharged on Bactrim. They tried to get him home health through [facility] part [facility] of course they declined him. His wife is been wrapping these applying some form of silver foam dressing. He has a history of wounds before although nothing that would not heal with basic home topical dressings. He has 2 areas on the left medial, left anterior and left lateral and a smaller area on the right medial. All of these have considerable depth. Past medical history includes iron deficiency anemia, lymphedema followed by the rehab center at [facility] with lymphedema wraps I believe, DVT on chronic anticoagulation, prostate cancer, chronic venous insufficiency, hypertension. As mentioned he has compression pumps but does not use them. ABIs in our clinic were noncompressible bilaterally 10/14; patient with 75 severe bilateral lymphedema right greater than left. He came in with bilateral lower extremity wounds left greater than right. Even though the right side has more of the edema most of the wounds here almost closed on the right medial. He has 3 remaining wounds on the left We have been using silver alginate under 4-layer compression I have been trying to get him to be compliant with his external compression pumps 10/21; patient with 77 small wounds on the left leg and 1 on the right medial in the setting of severe lymphedema and chronic venous insufficiency. We have been using silver alginate under 4-layer compression he is using his external compression pumps twice a day 11/4; ARTERIAL STUDIES on the right show 75 an ABI of 1.02 TBI of 0.858 with biphasic waveforms on the left 0.98 with a TBI of 0.55 and biphasic waveforms. Does not look like he  has significant arterial disease. We are treating him for lymphedema he has compression pumps. He has punched-out areas on the left anterior left lateral and right medial lower extremities 11/11; after we obtained his arterial studies I put him in 4 layer compression. He is using his compression pumps probably once a day although I have asked him to do twice. Primary dressing to the wound is silver collagen he has severe lymphedema likely secondary to chronic venous insufficiency. Wounds on the left lateral, left medial and left anterior and a small area on the right medial 12/2; the area on the right anterior lower leg has healed. We initially thought that the area medially had healed as well however when her discharge nurse came in she detected fluid in the wound simply opened up. This is actually worse than I remember this pain. The area on the left lateral potentially slightly smaller He is also complaining about pain in his left hand he says that this is actually been getting some better he has been using topical creams on this. She asked that I look at this 12/9 after last weeks issues we have 2 wounds one on the right medial lower leg and 1 on the left lateral. Both of these are in the same condition. I think because of thickened skin secondary to chronic lymphedema these wounds  actually have depth of almost 0.8 cm. 12/16; the patient has 2 small but deep wounds one on the right medial and one on the left lateral. The right medial is actually the worst of these. He arrives in clinic today with absolutely terrible edema in the right leg apparently his 4-layer wrap fell down to just above his ankle he did not think about this he is apparently been continuing to use his compression pump twice a day. The left leg looks a lot better. 05/09/2020 upon evaluation today patient appears to be doing decently well in regard to his wounds. Everything is measuring smaller the right leg still has a little bit  deeper wound in the left seems to be almost completely healed in my opinion I am very pleased in general with how things are progressing. He has a 4- layer compression wrap we have been using endoform today we will probably have to use collagen just based on the fact that we do not have endoform it is on order. 1/6; the patient's wound on the left lateral lower leg has healed. Still has 1 on the right medial. 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 Using compression pumps at home twice a day. 1/13; left lateral lower leg is still healed. He has a deep punched out rectangular shaped wound on the right medial calf. Looking down at this it appears that he is attempting to epithelialize around the edges of the wound and on the base as well. His edema is reasonably well controlled we have been using collagen with absolutely no effect 1/20; left lateral lower leg remains closed he has extremitease stockings. The area on the right medial calf I aggressively debrided last week measures larger but the surface looks better. We have been using Hydrofera Blue. We ran Oasis through his insurance but we have not seen the results of this 1/27; left lower leg wound with chronic venous insufficiency and secondary lymphedema. I did aggressive debridement on this last week the wound seems to have come in healthy looking surface using Hydrofera Blue. He was denied for Oasis 2/3; small divot in the right medial lower leg. Under illumination the walls of this divot are epithelialized however the base has slough which I removed with a curette we have been using Hydrofera Blue 2/10 small divot on the right medial lower leg pinpoint illumination at the base of this cone-shaped wound. We have been using Hydrofera Blue but I will switch to calcium alginate this week 2/17; the small divot on the right medial lower leg is fully epithelialized. There is no visible open area under illumination. He has his own  stocking for the right leg similar to the one he has been wearing on the left. Objective Constitutional Patient is hypertensive.. Pulse regular and within target range for patient.Marland Kitchen Respirations regular, non-labored and within target range.. Temperature is normal and within the target range for the patient.Marland Kitchen Appears in no distress. Vitals Time Taken: 2:15 PM, Height: 74 in, Source: Stated, Weight: 240 lbs, Source: Stated, BMI: 30.8, Temperature: 98.1 F, Pulse: 89 bpm, Respiratory Rate: 18 breaths/min, Blood Pressure: 169/72 mmHg. General Notes: Wound exam; pinpoint area at the bottom of the cone-shaped divot in the right medial lower leg is totally closed there is no open area here. He has excellent edema control Integumentary (Hair, Skin) Wound #1 status is Open. Original cause of wound was Blister. The wound is located on the Right,Medial Lower Leg. The wound measures 0cm length x 0cm  width x 0cm depth; 0cm^2 area and 0cm^3 volume. There is no tunneling or undermining noted. There is a small amount of serous drainage noted. The wound margin is distinct with the outline attached to the wound base. There is no granulation within the wound bed. There is no necrotic tissue within the wound bed. Assessment Active Problems ICD-10 Lymphedema, not elsewhere classified Non-pressure chronic ulcer of other part of right lower leg with fat layer exposed Chronic venous hypertension (idiopathic) with ulcer and inflammation of right lower extremity Plan Discharge From [facility] Services: Discharge from [place] - Call if any future wound care needs. Edema Control - Lymphedema / SCD / Other: Lymphedema Pumps. Use Lymphedema pumps on leg(s) 2-3 times a day for 45-60 minutes. If wearing any wraps or hose, do not remove them. Continue exercising as instructed. Elevate legs to the level of the heart or above for 30 minutes daily and/or when sitting, a frequency of: Avoid standing for long periods of  time. Patient to wear own compression stockings every day. - Extreme Ease to both legs 30-73mmHg. Apply first thing in the morning and remove at night. Exercise regularly Moisturize legs daily. - every night before bed to both legs. 1. The patient will be discharged to his own lymphedema pumps. 2. He has extremitease stockings 30 to 40 mm he has been wearing it on the left leg will apply it to the right. 3. Went over issues like skin lubrication walking. 4. He has stage III lymphedema he will need to be compliant with all of the above to maintain skin integrity Electronic Signature(s) Signed: 07/05/2020 5:29:55 PM By: Linton Ham MD Entered By: Linton Ham on 07/05/2020 15:28:22 -------------------------------------------------------------------------------- SuperBill Details Patient Name: Date of Service: [name], [name] 07/05/2020 Medical Record Number: [id number] Patient Account Number: [id number] Date of Birth/Sex: Treating RN: [birth year] (75 y.o. [name] Primary Care Provider: [name] Other Clinician: Referring Provider: Treating Provider/Extender: Drucilla Schmidt Weeks in Treatment: 19 Diagnosis Coding ICD-10 Codes Code Description I89.0 Lymphedema, not elsewhere classified G83.662 Non-pressure chronic ulcer of other part of right lower leg with fat layer exposed I87.331 Chronic venous hypertension (idiopathic) with ulcer and inflammation of right lower extremity Facility Procedures CPT4 Code: [id number] Description: 03546 - WOUND CARE VISIT-LEV 3 EST PT Modifier: Quantity: 1 Electronic Signature(s) Signed: 07/05/2020 5:29:55 PM By: Linton Ham MD Signed: 07/05/2020 5:51:10 PM By: Deon Pilling Entered By: Deon Pilling on 07/05/2020 14:57:49

## 2021-01-25 ENCOUNTER — Ambulatory Visit: Payer: Self-pay | Admitting: Surgery

## 2021-01-25 ENCOUNTER — Encounter (HOSPITAL_BASED_OUTPATIENT_CLINIC_OR_DEPARTMENT_OTHER): Payer: Self-pay | Admitting: Surgery

## 2021-01-25 ENCOUNTER — Other Ambulatory Visit: Payer: Self-pay

## 2021-01-25 NOTE — Progress Notes (Addendum)
Spoke w/ via phone for pre-op interview---pt  Lab needs dos----PT, Istat ,ekg               Lab results------ COVID test -----patient states asymptomatic no test needed Arrive at -------0915 on 01/29/2021 NPO after MN NO Solid Food.  Clear liquids from MN until---0815 on 01/29/2021 Med rec completed Medications to take morning of surgery -----hydralazine 25 mg  Diabetic medication -----N/a  Patient instructed no nail polish to be worn day of surgery Patient instructed to bring photo id and insurance card day of surgery Patient aware to have Driver (ride ) Azucena Fallen  caregiver    for 24 hours after surgery  Patient Special Instructions -----Hold Lasix the morning of surgery.   Pre-Op special Istructions -----Per pt from PCP pt to hold warfarin for 5 days last dose 01/24/2021 Patient verbalized understanding of instructions that were given at this phone interview. Patient denies shortness of breath, chest pain, fever, cough at this phone interview.    Spoke with Raven at [facility] [place]   triage office at 1206 on 01/25/2021, about pt taking warfarin  Will talk with nurse and call pt and call surgery center PAT

## 2021-01-28 NOTE — Anesthesia Preprocedure Evaluation (Addendum)
Anesthesia Evaluation  Patient identified by MRN, date of birth, ID band Patient awake    Reviewed: Allergy & Precautions, NPO status , Patient's Chart, lab work & pertinent test results, reviewed documented beta blocker date and time   Airway Mallampati: II  TM Distance: >3 FB Neck ROM: Full    Dental  (+) Missing, Poor Dentition, Dental Advisory Given,    Pulmonary neg pulmonary ROS,    Pulmonary exam normal breath sounds clear to auscultation       Cardiovascular hypertension, Pt. on medications and Pt. on home beta blockers + DVT (coumadin)  Normal cardiovascular exam Rhythm:Regular Rate:Normal  Severe LE edema- per pt stopped taking diuretic because he was urinating too frequently. Has been using compression stockings and SCDs at home which he says has helped  Last dose coumadin 5d ago (thurs 9/8). Has been following w/ cardiology for INR checks, normally in the low 2s per pt   Neuro/Psych negative neurological ROS  negative psych ROS   GI/Hepatic negative GI ROS, Neg liver ROS,   Endo/Other  negative endocrine ROS  Renal/GU CRFRenal diseaseCKD 3, cr 1.6  negative genitourinary   Musculoskeletal negative musculoskeletal ROS (+)   Abdominal (+) + obese,   Peds  Hematology  (+) Blood dyscrasia, anemia , hct 32 Coumadin- INR    Anesthesia Other Findings Left inguinal hernia   Reproductive/Obstetrics negative OB ROS                            Anesthesia Physical Anesthesia Plan  ASA: 3  Anesthesia Plan: General   Post-op Pain Management:    Induction: Intravenous  PONV Risk Score and Plan: 3 and Ondansetron, Dexamethasone, Midazolam and Treatment may vary due to age or medical condition  Airway Management Planned: Oral ETT  Additional Equipment: None  Intra-op Plan:   Post-operative Plan: Extubation in OR  Informed Consent: I have reviewed the patients History and Physical,  chart, labs and discussed the procedure including the risks, benefits and alternatives for the proposed anesthesia with the patient or authorized representative who has indicated his/her understanding and acceptance.     Dental advisory given  Plan Discussed with: CRNA  Anesthesia Plan Comments:        Anesthesia Quick Evaluation

## 2021-01-29 ENCOUNTER — Encounter (HOSPITAL_BASED_OUTPATIENT_CLINIC_OR_DEPARTMENT_OTHER)
Admission: RE | Disposition: A | Discharge status: Home / Self Care | Payer: Self-pay | Source: Ambulatory Visit | Attending: Surgery

## 2021-01-29 ENCOUNTER — Encounter (HOSPITAL_BASED_OUTPATIENT_CLINIC_OR_DEPARTMENT_OTHER): Payer: Self-pay | Admitting: Surgery

## 2021-01-29 ENCOUNTER — Other Ambulatory Visit: Payer: Self-pay

## 2021-01-29 ENCOUNTER — Ambulatory Visit (HOSPITAL_BASED_OUTPATIENT_CLINIC_OR_DEPARTMENT_OTHER): Payer: Medicare Other | Admitting: Anesthesiology

## 2021-01-29 ENCOUNTER — Ambulatory Visit (HOSPITAL_BASED_OUTPATIENT_CLINIC_OR_DEPARTMENT_OTHER)
Admission: RE | Admit: 2021-01-29 | Discharge: 2021-01-29 | Disposition: A | Discharge status: Home / Self Care | Payer: Medicare Other | Source: Ambulatory Visit | Attending: Surgery | Admitting: Surgery

## 2021-01-29 DIAGNOSIS — K4090 Unilateral inguinal hernia, without obstruction or gangrene, not specified as recurrent: Secondary | ICD-10-CM | POA: Insufficient documentation

## 2021-01-29 DIAGNOSIS — Z79899 Other long term (current) drug therapy: Secondary | ICD-10-CM | POA: Insufficient documentation

## 2021-01-29 DIAGNOSIS — N1831 Chronic kidney disease, stage 3a: Secondary | ICD-10-CM | POA: Insufficient documentation

## 2021-01-29 DIAGNOSIS — Z888 Allergy status to other drugs, medicaments and biological substances status: Secondary | ICD-10-CM | POA: Diagnosis not present

## 2021-01-29 DIAGNOSIS — Z8546 Personal history of malignant neoplasm of prostate: Secondary | ICD-10-CM | POA: Diagnosis not present

## 2021-01-29 DIAGNOSIS — Z86718 Personal history of other venous thrombosis and embolism: Secondary | ICD-10-CM | POA: Diagnosis not present

## 2021-01-29 DIAGNOSIS — Z881 Allergy status to other antibiotic agents status: Secondary | ICD-10-CM | POA: Insufficient documentation

## 2021-01-29 DIAGNOSIS — I1310 Hypertensive heart and chronic kidney disease without heart failure, with stage 1 through stage 4 chronic kidney disease, or unspecified chronic kidney disease: Secondary | ICD-10-CM | POA: Diagnosis not present

## 2021-01-29 DIAGNOSIS — Z7901 Long term (current) use of anticoagulants: Secondary | ICD-10-CM | POA: Diagnosis not present

## 2021-01-29 HISTORY — DX: Presence of spectacles and contact lenses: Z97.3

## 2021-01-29 HISTORY — DX: Chronic kidney disease, unspecified: N18.9

## 2021-01-29 HISTORY — PX: INGUINAL HERNIA REPAIR: SHX194

## 2021-01-29 LAB — POCT I-STAT, CHEM 8
BUN: 34 mg/dL — ABNORMAL HIGH (ref 8–23)
Calcium, Ion: 1.25 mmol/L (ref 1.15–1.40)
Chloride: 106 mmol/L (ref 98–111)
Creatinine, Ser: 1.6 mg/dL — ABNORMAL HIGH (ref 0.61–1.24)
Glucose, Bld: 94 mg/dL (ref 70–99)
HCT: 32 % — ABNORMAL LOW (ref 39.0–52.0)
Hemoglobin: 10.9 g/dL — ABNORMAL LOW (ref 13.0–17.0)
Potassium: 4 mmol/L (ref 3.5–5.1)
Sodium: 142 mmol/L (ref 135–145)
TCO2: 26 mmol/L (ref 22–32)

## 2021-01-29 LAB — PROTIME-INR
INR: 1.2 (ref 0.8–1.2)
Prothrombin Time: 14.8 seconds (ref 11.4–15.2)

## 2021-01-29 SURGERY — REPAIR, HERNIA, INGUINAL, LAPAROSCOPIC
Anesthesia: General | Site: Inguinal | Laterality: Left

## 2021-01-29 MED ORDER — SODIUM CHLORIDE 0.9 % IV SOLN: INTRAVENOUS | Status: DC

## 2021-01-29 MED ORDER — DEXAMETHASONE SODIUM PHOSPHATE 10 MG/ML IJ SOLN
INTRAMUSCULAR | Status: AC
  Filled 2021-01-29: qty 1

## 2021-01-29 MED ORDER — ONDANSETRON HCL 4 MG/2ML IJ SOLN: 4.0000 mg | Freq: Once | INTRAMUSCULAR | Status: DC | PRN

## 2021-01-29 MED ORDER — PHENYLEPHRINE HCL (PRESSORS) 10 MG/ML IV SOLN
INTRAVENOUS | Status: DC | PRN
  Administered 2021-01-29: 80 ug via INTRAVENOUS
  Administered 2021-01-29: 40 ug via INTRAVENOUS
  Administered 2021-01-29: 160 ug via INTRAVENOUS
  Administered 2021-01-29: 40 ug via INTRAVENOUS
  Administered 2021-01-29: 80 ug via INTRAVENOUS

## 2021-01-29 MED ORDER — BUPIVACAINE HCL 0.5 % IJ SOLN
INTRAMUSCULAR | Status: DC | PRN
  Administered 2021-01-29: 30 mL

## 2021-01-29 MED ORDER — ONDANSETRON HCL 4 MG/2ML IJ SOLN
INTRAMUSCULAR | Status: DC | PRN
  Administered 2021-01-29: 4 mg via INTRAVENOUS

## 2021-01-29 MED ORDER — OXYCODONE-ACETAMINOPHEN 5-325 MG PO TABS: 1.0000 | ORAL_TABLET | ORAL | 0 refills | Status: DC | PRN

## 2021-01-29 MED ORDER — FENTANYL CITRATE (PF) 100 MCG/2ML IJ SOLN
INTRAMUSCULAR | Status: DC | PRN
  Administered 2021-01-29 (×2): 25 ug via INTRAVENOUS

## 2021-01-29 MED ORDER — PROPOFOL 10 MG/ML IV BOLUS
INTRAVENOUS | Status: DC | PRN
  Administered 2021-01-29: 100 mg via INTRAVENOUS

## 2021-01-29 MED ORDER — LIDOCAINE HCL (CARDIAC) PF 100 MG/5ML IV SOSY
PREFILLED_SYRINGE | INTRAVENOUS | Status: DC | PRN
  Administered 2021-01-29: 60 mg via INTRAVENOUS

## 2021-01-29 MED ORDER — PHENYLEPHRINE HCL-NACL 20-0.9 MG/250ML-% IV SOLN
INTRAVENOUS | Status: DC | PRN
  Administered 2021-01-29: 60 ug/min via INTRAVENOUS

## 2021-01-29 MED ORDER — PROPOFOL 10 MG/ML IV BOLUS
INTRAVENOUS | Status: AC
  Filled 2021-01-29: qty 20

## 2021-01-29 MED ORDER — BUPIVACAINE LIPOSOME 1.3 % IJ SUSP
INTRAMUSCULAR | Status: DC | PRN
  Administered 2021-01-29: 20 mL

## 2021-01-29 MED ORDER — OXYCODONE HCL 5 MG/5ML PO SOLN: 5.0000 mg | Freq: Once | ORAL | Status: DC | PRN

## 2021-01-29 MED ORDER — GABAPENTIN 300 MG PO CAPS
300.0000 mg | ORAL_CAPSULE | ORAL | Status: AC
  Administered 2021-01-29: 300 mg via ORAL

## 2021-01-29 MED ORDER — CELECOXIB 200 MG PO CAPS
ORAL_CAPSULE | ORAL | Status: AC
  Filled 2021-01-29: qty 2

## 2021-01-29 MED ORDER — ACETAMINOPHEN 500 MG PO TABS
ORAL_TABLET | ORAL | Status: AC
  Filled 2021-01-29: qty 2

## 2021-01-29 MED ORDER — CHLORHEXIDINE GLUCONATE CLOTH 2 % EX PADS: 6.0000 | MEDICATED_PAD | Freq: Once | CUTANEOUS | Status: DC

## 2021-01-29 MED ORDER — LIDOCAINE 2% (20 MG/ML) 5 ML SYRINGE
INTRAMUSCULAR | Status: AC
  Filled 2021-01-29: qty 5

## 2021-01-29 MED ORDER — ONDANSETRON HCL 4 MG/2ML IJ SOLN
INTRAMUSCULAR | Status: AC
  Filled 2021-01-29: qty 2

## 2021-01-29 MED ORDER — SUGAMMADEX SODIUM 200 MG/2ML IV SOLN
INTRAVENOUS | Status: DC | PRN
  Administered 2021-01-29: 200 mg via INTRAVENOUS

## 2021-01-29 MED ORDER — HYDROMORPHONE HCL 1 MG/ML IJ SOLN
INTRAMUSCULAR | Status: AC
  Filled 2021-01-29: qty 1

## 2021-01-29 MED ORDER — GABAPENTIN 300 MG PO CAPS
ORAL_CAPSULE | ORAL | Status: AC
  Filled 2021-01-29: qty 1

## 2021-01-29 MED ORDER — CLINDAMYCIN PHOSPHATE 900 MG/50ML IV SOLN
900.0000 mg | INTRAVENOUS | Status: AC
  Administered 2021-01-29: 900 mg via INTRAVENOUS

## 2021-01-29 MED ORDER — OXYCODONE HCL 5 MG PO TABS
5.0000 mg | ORAL_TABLET | Freq: Once | ORAL | Status: DC | PRN
Start: 2021-01-29 — End: 2021-01-29

## 2021-01-29 MED ORDER — HYDROMORPHONE HCL 1 MG/ML IJ SOLN
0.2500 mg | INTRAMUSCULAR | Status: DC | PRN
  Administered 2021-01-29: 0.5 mg via INTRAVENOUS

## 2021-01-29 MED ORDER — CLINDAMYCIN PHOSPHATE 900 MG/50ML IV SOLN
INTRAVENOUS | Status: AC
  Filled 2021-01-29: qty 50

## 2021-01-29 MED ORDER — FENTANYL CITRATE (PF) 100 MCG/2ML IJ SOLN
INTRAMUSCULAR | Status: AC
  Filled 2021-01-29: qty 2

## 2021-01-29 MED ORDER — DEXAMETHASONE SODIUM PHOSPHATE 4 MG/ML IJ SOLN
INTRAMUSCULAR | Status: DC | PRN
  Administered 2021-01-29: 10 mg via INTRAVENOUS

## 2021-01-29 MED ORDER — CELECOXIB 200 MG PO CAPS
400.0000 mg | ORAL_CAPSULE | ORAL | Status: AC
  Administered 2021-01-29: 400 mg via ORAL

## 2021-01-29 MED ORDER — ROCURONIUM BROMIDE 100 MG/10ML IV SOLN
INTRAVENOUS | Status: DC | PRN
  Administered 2021-01-29: 20 mg via INTRAVENOUS
  Administered 2021-01-29: 80 mg via INTRAVENOUS

## 2021-01-29 MED ORDER — ACETAMINOPHEN 500 MG PO TABS
1000.0000 mg | ORAL_TABLET | ORAL | Status: AC
  Administered 2021-01-29: 1000 mg via ORAL

## 2021-01-29 MED ORDER — PHENYLEPHRINE HCL (PRESSORS) 10 MG/ML IV SOLN
INTRAVENOUS | Status: AC
  Filled 2021-01-29: qty 2

## 2021-01-29 SURGICAL SUPPLY — 37 items
ADH SKN CLS APL DERMABOND .7 (GAUZE/BANDAGES/DRESSINGS) ×1
APL PRP STRL LF DISP 70% ISPRP (MISCELLANEOUS) ×1
BLADE CLIPPER SENSICLIP SURGIC (BLADE) IMPLANT
CABLE HIGH FREQUENCY MONO STRZ (ELECTRODE) ×2 IMPLANT
CHLORAPREP W/TINT 26 (MISCELLANEOUS) ×2 IMPLANT
DERMABOND ADVANCED (GAUZE/BANDAGES/DRESSINGS) ×1
DERMABOND ADVANCED .7 DNX12 (GAUZE/BANDAGES/DRESSINGS) ×1 IMPLANT
ELECT REM PT RETURN 9FT ADLT (ELECTROSURGICAL) ×2
ELECTRODE REM PT RTRN 9FT ADLT (ELECTROSURGICAL) ×1 IMPLANT
GAUZE 4X4 16PLY ~~LOC~~+RFID DBL (SPONGE) ×2 IMPLANT
GLOVE SURG ENC MOIS LTX SZ7 (GLOVE) ×2 IMPLANT
GLOVE SURG UNDER POLY LF SZ7.5 (GLOVE) ×2 IMPLANT
GOWN STRL REUS W/ TWL XL LVL3 (GOWN DISPOSABLE) ×1 IMPLANT
GOWN STRL REUS W/TWL LRG LVL3 (GOWN DISPOSABLE) ×4 IMPLANT
GOWN STRL REUS W/TWL XL LVL3 (GOWN DISPOSABLE) ×2
GRASPER SUT TROCAR 14GX15 (MISCELLANEOUS) ×2 IMPLANT
IRRIG SUCT STRYKERFLOW 2 WTIP (MISCELLANEOUS)
IRRIGATION SUCT STRKRFLW 2 WTP (MISCELLANEOUS) IMPLANT
KIT TURNOVER CYSTO (KITS) ×2 IMPLANT
MESH 3DMAX 5X7 LT XLRG (Mesh General) ×2 IMPLANT
NEEDLE INSUFFLATION 120MM (ENDOMECHANICALS) ×2 IMPLANT
PACK BASIN DAY SURGERY FS (CUSTOM PROCEDURE TRAY) ×2 IMPLANT
PAD POSITIONING PINK XL (MISCELLANEOUS) ×2 IMPLANT
RELOAD STAPLE HERNIA 4.0 BLUE (INSTRUMENTS) ×2 IMPLANT
SCISSORS LAP 5X35 DISP (ENDOMECHANICALS) ×2 IMPLANT
SET TUBE SMOKE EVAC HIGH FLOW (TUBING) ×2 IMPLANT
SPONGE T-LAP 18X18 ~~LOC~~+RFID (SPONGE) ×2 IMPLANT
STAPLER HERNIA 12 8.5 360D (INSTRUMENTS) ×2 IMPLANT
SUT MNCRL AB 4-0 PS2 18 (SUTURE) ×2 IMPLANT
SUT VICRYL 0 UR6 27IN ABS (SUTURE) IMPLANT
SUT VLOC 180 2-0 6IN GS21 (SUTURE) ×2 IMPLANT
TOWEL OR 17X26 10 PK STRL BLUE (TOWEL DISPOSABLE) ×2 IMPLANT
TRAY FOL W/BAG SLVR 16FR STRL (SET/KITS/TRAYS/PACK) IMPLANT
TRAY FOLEY W/BAG SLVR 16FR LF (SET/KITS/TRAYS/PACK)
TRAY LAPAROSCOPIC (CUSTOM PROCEDURE TRAY) ×2 IMPLANT
TROCAR BLADELESS OPT 12M 100M (ENDOMECHANICALS) ×2 IMPLANT
TROCAR BLADELESS OPT 5 100 (ENDOMECHANICALS) ×4 IMPLANT

## 2021-01-29 NOTE — Anesthesia Procedure Notes (Signed)
Procedure Name: Intubation Date/Time: 01/29/2021 11:27 AM Performed by: Bufford Spikes, CRNA Pre-anesthesia Checklist: Patient identified, Emergency Drugs available, Suction available and Patient being monitored Patient Re-evaluated:Patient Re-evaluated prior to induction Oxygen Delivery Method: Circle system utilized Preoxygenation: Pre-oxygenation with 100% oxygen Induction Type: IV induction Ventilation: Mask ventilation without difficulty Laryngoscope Size: Miller and 2 Grade View: Grade II Tube type: Oral Number of attempts: 1 Airway Equipment and Method: Stylet and Oral airway Placement Confirmation: ETT inserted through vocal cords under direct vision, positive ETCO2 and breath sounds checked- equal and bilateral Secured at: 23 cm Tube secured with: Tape Dental Injury: Teeth and Oropharynx as per pre-operative assessment

## 2021-01-29 NOTE — Transfer of Care (Signed)
Immediate Anesthesia Transfer of Care Note  Patient: Grantland Munns  Procedure(s) Performed: LAPAROSCOPIC LEFT INGUINAL HERNIA REPAIR WITH MESH (Left: Inguinal)  Patient Location: PACU  Anesthesia Type:General  Level of Consciousness: awake, alert  and oriented  Airway & Oxygen Therapy: Patient Spontanous Breathing and Patient connected to nasal cannula oxygen  Post-op Assessment: Report given to RN and Post -op Vital signs reviewed and stable  Post vital signs: Reviewed and stable  Last Vitals:  Vitals Value Taken Time  BP 162/70 01/29/21 1330  Temp 36.4 C 01/29/21 1330  Pulse 84 01/29/21 1331  Resp 15 01/29/21 1331  SpO2 100 % 01/29/21 1331  Vitals shown include unvalidated device data.  Last Pain:  Vitals:   01/29/21 1022  TempSrc: Oral  PainSc: 0-No pain      Patients Stated Pain Goal: 6 ([id number])  Complications: No notable events documented.

## 2021-01-29 NOTE — Discharge Instructions (Addendum)
Post Anesthesia Home Care Instructions  Activity: Get plenty of rest for the remainder of the day. A responsible individual must stay with you for 24 hours following the procedure.  For the next 24 hours, DO NOT: -Drive a car -Paediatric nurse -Drink alcoholic beverages -Take any medication unless instructed by your physician -Make any legal decisions or sign important papers.  Meals: Start with liquid foods such as gelatin or soup. Progress to regular foods as tolerated. Avoid greasy, spicy, heavy foods. If nausea and/or vomiting occur, drink only clear liquids until the nausea and/or vomiting subsides. Call your physician if vomiting continues.  Special Instructions/Symptoms: Your throat may feel dry or sore from the anesthesia or the breathing tube placed in your throat during surgery. If this causes discomfort, gargle with warm salt water. The discomfort should disappear within 24 hours.  **No Tylenol or acetaminophen; No ibuprofen, Advil, Aleve, Motrin, ketorolac, meloxicam, or naproxen until after 4:30pm today if needed.       GROIN HERNIA REPAIR POST OPERATIVE INSTRUCTIONS  Thinking Clearly  The anesthesia may cause you to feel different for 1 or 2 days. Do not drive, drink alcohol, or make any big decisions for at least 2 days.  Nutrition When you wake up, you will be able to drink small amounts of liquid. If you do not feel sick, you can slowly advance your diet to regular foods. Continue to drink lots of fluids, usually about 8 to 10 glasses per day. Eat a high-fiber diet so you don't strain during bowel movements. High-Fiber Foods Foods high in fiber include beans, bran cereals and whole-grain breads, peas, dried fruit (figs, apricots, and dates), raspberries, blackberries, strawberries, sweet corn, broccoli, baked potatoes with skin, plums, pears, apples, greens, and nuts. Activity Slowly increase your activity. Be sure to get up and walk every hour or so to prevent  blood clots. No heavy lifting or strenuous activity for 4 weeks following surgery to prevent hernias at your incision sites or recurrence of your hernia. It is normal to feel tired. You may need more sleep than usual.  Get your rest but make sure to get up and move around frequently to prevent blood clots and pneumonia.  Work and Return to [employer] can go back to work when you feel well enough. Discuss the timing with your surgeon. You can usually go back to school or work 1 week or less after an laparoscopic or an open repair. If your work requires heavy lifting or strenuous activity you need to be placed on light duty for 4 weeks following surgery. You can return to gym class, sports or other physical activities 4 weeks after surgery.  Wound Care You may experience significant bruising in the groin including into the scrotum in males.  Rest, elevating the groin and scrotum above the level of the heart, ice and compression with tight fitting underwear can help.  Always wash your hands before and after touching near your incision site. Do not soak in a bathtub until cleared at your follow up appointment. You may take a shower 24 hours after surgery. A small amount of drainage from the incision is normal. If the drainage is thick and yellow or the site is red, you may have an infection, so call your surgeon. If you have a drain in one of your incisions, it will be taken out in office when the drainage stops. Steri-Strips will fall off in 7 to 10 days or they will be removed during your first  office visit. If you have dermabond glue covering over the incision, allow the glue to flake off on its own. Protect the new skin, especially from the sun. The sun can burn and cause darker scarring. Your scar will heal in about 4 to 6 weeks and will become softer and continue to fade over the next year.  The cosmetic appearance of the incisions will improve over the course of the first year after  surgery. Sensation around your incision will return in a few weeks or months.  Bowel Movements After intestinal surgery, you may have loose watery stools for several days. If watery diarrhea lasts longer than 3 days, contact your surgeon. Pain medication (narcotics) can cause constipation. Increase the fiber in your diet with high-fiber foods if you are constipated. You can take an over the counter stool softener like Colace to avoid constipation.  Additional over the counter medications can also be used if Colace isn't sufficient (for example, Milk of Magnesia or Miralax).  Pain The amount of pain is different for each person. Some people need only 1 to 3 doses of pain control medication, while others need more. Take alternating doses of tylenol and ibuprofen around the clock for the first five days following surgery.  This will provide a baseline of pain control and help with inflammation.  Take the narcotic pain medication in addition if needed for severe pain.  Contact Your Surgeon at [phone], if you have: Pain that will not go away Pain that gets worse A fever of more than 101F (38.3C) Repeated vomiting Swelling, redness, bleeding, or bad-smelling drainage from your wound site Strong abdominal pain No bowel movement or unable to pass gas for 3 days Watery diarrhea lasting longer than 3 days  Pain Control The goal of pain control is to minimize pain, keep you moving and help you heal. Your surgical team will work with you on your pain plan. Most often a combination of therapies and medications are used to control your pain. You may also be given medication (local anesthetic) at the surgical site. This may help control your pain for several days. Extreme pain puts extra stress on your body at a time when your body needs to focus on healing. Do not wait until your pain has reached a level "10" or is unbearable before telling your doctor or nurse. It is much easier to control pain  before it becomes severe. Following a laparoscopic procedure, pain is sometimes felt in the shoulder. This is due to the gas inserted into your abdomen during the procedure. Moving and walking helps to decrease the gas and the right shoulder pain.  Use the guide below for ways to manage your post-operative pain. Learn more by going to facs.org/safepaincontrol.  How Intense Is My Pain Common Therapies to Feel Better       I hardly notice my pain, and it does not interfere with my activities.  I notice my pain and it distracts me, but I can still do activities (sitting up, walking, standing).  Non-Medication Therapies  Ice (in a bag, applied over clothing at the surgical site), elevation, rest, meditation, massage, distraction (music, TV, play) walking and mild exercise Splinting the abdomen with pillows +  Non-Opioid Medications Acetaminophen (Tylenol) Non-steroidal anti-inflammatory drugs (NSAIDS) Aspirin, Ibuprofen (Motrin, Advil) Naproxen (Aleve) Take these as needed, when you feel pain. Both acetaminophen and NSAIDs help to decrease pain and swelling (inflammation).      My pain is hard to ignore and is more noticeable  even when I rest.  My pain interferes with my usual activities.  Non-Medication Therapies  +  Non-Opioid medications  Take on a regular schedule (around-the-clock) instead of as needed. (For example, Tylenol every 6 hours at 9:00 am, 3:00 pm, 9:00 pm, 3:00 am and Motrin every 6 hours at 12:00 am, 6:00 am, 12:00 pm, 6:00 pm)         I am focused on my pain, and I am not doing my daily activities.  I am groaning in pain, and I cannot sleep. I am unable to do anything.  My pain is as bad as it could be, and nothing else matters.  Non-Medication Therapies  +  Around-the-Clock Non-Opioid Medications  +  Short-acting opioids  Opioids should be used with other medications to manage severe pain. Opioids block pain and give a feeling of  euphoria (feel high). Addiction, a serious side effect of opioids, is rare with short-term (a few days) use.  Examples of short-acting opioids include: Tramadol (Ultram), Hydrocodone (Norco, Vicodin), Hydromorphone (Dilaudid), Oxycodone (Oxycontin)     The above directions have been adapted from the [employer] Surgical Patient Education Program.  Please refer to the ACS website if needed: PreferredVet.ca.ashx   Louanna Raw, MD [facility], [place], [place], [place], [place]  [address] ?  [address], [place],    [address] [phone] ? [phone] ? FAX [phone] Web site: [email or web address]    Information for Discharge Teaching: EXPAREL (bupivacaine liposome injectable suspension)   Your surgeon or anesthesiologist gave you EXPAREL(bupivacaine) to help control your pain after surgery.  EXPAREL is a local anesthetic that provides pain relief by numbing the tissue around the surgical site. EXPAREL is designed to release pain medication over time and can control pain for up to 72 hours. Depending on how you respond to EXPAREL, you may require less pain medication during your recovery.  Possible side effects: Temporary loss of sensation or ability to move in the area where bupivacaine was injected. Nausea, vomiting, constipation Rarely, numbness and tingling in your mouth or lips, lightheadedness, or anxiety may occur. Call your doctor right away if you think you may be experiencing any of these sensations, or if you have other questions regarding possible side effects.  Follow all other discharge instructions given to you by your surgeon or nurse. Eat a healthy diet and drink plenty of water or other fluids.  If you return to the hospital for any reason within 96 hours following the administration of EXPAREL, it is important for health care  providers to know that you have received this anesthetic. A teal colored band has been placed on your arm with the date, time and amount of EXPAREL you have received in order to alert and inform your health care providers. Please leave this armband in place for the full 96 hours following administration, and then you may remove the band.

## 2021-01-29 NOTE — Op Note (Signed)
   Patient: [name] (Jul 24, 1945, [id number])  Date of Surgery: 01/29/2021   Preoperative Diagnosis: LEFT INGUINAL HERNIA   Postoperative Diagnosis: LEFT INGUINAL HERNIA   Surgical Procedure: LAPAROSCOPIC LEFT INGUINAL HERNIA REPAIR WITH MESH: [id number]   Operative Team Members:  Surgeon(s) and Role:    * Sarahjane Matherly, Nickola Major, MD - Primary   Anesthesiologist: [name], DO CRNA: Bufford Spikes, CRNA; Rogers Blocker, CRNA   Anesthesia: General   Fluids:  Total I/O In: 800 [id number] Out: -   Complications: None  Drains:  None  Specimen: None  Disposition:  PACU - hemodynamically stable.  Plan of Care: Discharge to home after PACU  Indications for Procedure: [name] is a 75 y.o. male who presented with a left inguinal hernia.  I recommended laparoscopic left inguinal hernia repair with mesh. The procedure itself as well as the risks, benefits and alternatives were discussed and the patient granted consent to proceed.  Findings:  Technique: Transabdominal preperitoneal (TAPP) Hernia Location: left indirect inguinal hernia Mesh Size &Type:  Bard 3D Max Extra-large Mesh Fixation: Endo-Universal hernia stapler  Infection status: Patient: Private Patient Elective Case Case: Elective Infection Present At Time Of Surgery (PATOS): None   Description of Procedure:  The patient was positioned supine, padded and secured to the bed, with both arms tucked.  The abdomen was widely prepped and draped.  A time out procedure was performed.  A 1 cm infraumbilical incision was made.  The abdomen was entered without trauma to the underlying viscera.  The abdomen was insufflated to 15 mm of Hg.  A 12 mm trocar was inserted at the periumbilical incision.  Additional 5 mm trocars were placed in the left and right abdomen.  There was no trauma to the underlying viscera.  There was an indirect hernia on the LEFT.  Utilizing a transabdominal pre peritoneal technique  (TAPP), a horizontal incision was made in the peritoneum, immediately below the umbilicus.  Dissection was carried out in the pre peritoneal space down to the level of the hernia sac which was reduced into the peritoneal cavity completely.  The cord contents were parietalized and preserved.  A large pre peritoneal dissection was performed to uncover the direct, indirect, femoral and obturator spaces.  Cooper's ligament was uncovered medially and the psoas muscle uncovered laterally.  The dissection was difficult due to previous prostatectomy scar tissue.  The mesh, as documented above, was opened and advanced into the pre peritoneal position so that it more than adequately covered the indirect, direct, femoral and obturator spaces.  The mesh laid flat, with no inferior folds and covered the entire myopectineal orifice.  The mesh was fixated with the endo-universal hernia stapler to Cooper's ligament and the posterior aspect of the rectus muscle.  The peritoneal flap was closed with the same device.  There was a peritoneal defect that was closed using a 2-0 v-loc at the base of the hernia sac.  With that closed, there were no peritoneal defects or exposed mesh at the conclusion.  The umbilical trocar was removed and the fascial defect was closed with a 0 Vicryl suture in a figure of 8 fashion.  The peritoneal cavity was completely desufflated, the trocars removed and the skin closed with 4-0 Monocryl subcuticular suture and skin glue.  All sponge and needle counts were correct at the end of the case.  Louanna Raw, MD General, Bariatric, & Minimally Invasive Surgery [facility], [place]

## 2021-01-29 NOTE — Anesthesia Postprocedure Evaluation (Signed)
Anesthesia Post Note  Patient: [name]  Procedure(s) Performed: LAPAROSCOPIC LEFT INGUINAL HERNIA REPAIR WITH MESH (Left: Inguinal)     Patient location during evaluation: PACU Anesthesia Type: General Level of consciousness: awake and alert, oriented and patient cooperative Pain management: pain level controlled Vital Signs Assessment: post-procedure vital signs reviewed and stable Respiratory status: spontaneous breathing, nonlabored ventilation and respiratory function stable Cardiovascular status: blood pressure returned to baseline and stable Postop Assessment: no apparent nausea or vomiting Anesthetic complications: no   No notable events documented.  Last Vitals:  Vitals:   01/29/21 1345 01/29/21 1400  BP: (!) 161/64 (!) 158/59  Pulse: 77 78  Resp: 16 19  Temp:    SpO2: 100% 96%    Last Pain:  asleep               [name]

## 2021-01-29 NOTE — H&P (Signed)
Admitting Physician: Nickola Major Itay Mella  Service: General Surgery  CC: Left inguinal hernia  Subjective   HPI: [name] is an 75 y.o. male who is here for elective laparoscopic left inguinal hernia repair  Past Medical History:  Diagnosis Date   CKD STAGE 3A    PER DR. Vikki Ports CONNOR ON 01/10/2021 OFFICE VISIT   Difficulty sleeping    DVT (deep venous thrombosis) (HCC)    rt leg 3 yrs ago   Hypertension    Leg cramps    Prostate cancer (HCC)    Swelling of lower extremity    RT LEG   Varicose veins    both legs when he was 75 yrs old. 01/25/2021   Wears glasses    wears glasses for driving.  01/25/2021    Past Surgical History:  Procedure Laterality Date   HERNIA REPAIR  05/19/2010   INGUINAL   LYMPHADENECTOMY Bilateral 12/30/2013   Procedure: PELVIC LYMPH NODE DISSECTION;  Surgeon: Alexis Frock, MD;  Location: [facility];  Service: Urology;  Laterality: Bilateral;   NASAL SINUS SURGERY     ROBOT ASSISTED LAPAROSCOPIC RADICAL PROSTATECTOMY N/A 12/30/2013   Procedure: ROBOTIC ASSISTED LAPAROSCOPIC RADICAL PROSTATECTOMY WITH INDOCYANINE GREEN DYE;  Surgeon: Alexis Frock, MD;  Location: [facility];  Service: Urology;  Laterality: N/A;   VEIN SURGERY     RT LEG  had surgery 36 yrs ago. 01/25/2021    History reviewed. No pertinent family history.  Social:  reports that he has never smoked. He has never used smokeless tobacco. He reports that he does not drink alcohol and does not use drugs.  Allergies:  Allergies  Allergen Reactions   Cephalexin     patient only took one pill and stated that it made him vomit   Mometasone     patient states the cream made sores on his leg    Medications: Current Outpatient Medications  Medication Instructions   allopurinol (ZYLOPRIM) 100 mg, Oral, Daily   amLODipine (NORVASC) 5 mg, Oral, Daily   ammonium lactate (AMLACTIN) 12 % cream Topical, As needed   ciclopirox (PENLAC) 8 % solution Topical, Daily at bedtime, Apply  over nail and surrounding skin. Apply daily over previous coat. After seven (7) days, may remove with alcohol and continue cycle.   clindamycin (CLEOCIN) 300 mg, Oral, 3 times daily   colchicine 0.6 mg, Oral, Daily   ferrous sulfate 325 mg, Oral, 3 times daily with meals   furosemide (LASIX) 40 mg, Oral   hydrALAZINE (APRESOLINE) 25 MG tablet TAKE 1 TABLET BY MOUTH TWICE A DAY FOR HEART   losartan (COZAAR) 100 mg, Oral, Daily   metoprolol tartrate (LOPRESSOR) 25 MG tablet Oral   OLMESARTAN MEDOXOMIL PO Oral   torsemide (DEMADEX) 20 mg, Oral, Daily   traMADol (ULTRAM) 50 MG tablet Takes PRN dor pain   triamcinolone cream (KENALOG) 0.1 % 1 application, Topical, 2 times daily   valACYclovir (VALTREX) 500 mg, Oral, Daily   warfarin (COUMADIN) 7.5 mg, Oral, Daily, Hold for 5 days before surgery by primary care dr.last dose on 01/24/21    ROS - all of the below systems have been reviewed with the patient and positives are indicated with bold text General: chills, fever or night sweats Eyes: blurry vision or double vision ENT: epistaxis or sore throat Allergy/Immunology: itchy/watery eyes or nasal congestion Hematologic/Lymphatic: bleeding problems, blood clots or swollen lymph nodes Endocrine: temperature intolerance or unexpected weight changes Breast: new or changing breast lumps or nipple discharge Resp:  cough, shortness of breath, or wheezing CV: chest pain or dyspnea on exertion GI: as per HPI GU: dysuria, trouble voiding, or hematuria MSK: joint pain or joint stiffness Neuro: TIA or stroke symptoms Derm: pruritus and skin lesion changes Psych: anxiety and depression  Objective   PE Height '6\' 2"'$  (1.88 m), weight 79.4 kg. Constitutional: NAD; conversant; no deformities Eyes: Moist conjunctiva; no lid lag; anicteric; PERRL Neck: Trachea midline; no thyromegaly Lungs: Normal respiratory effort; no tactile fremitus CV: RRR; no palpable thrills; no pitting edema GI: Abd large left  inguinoscrotal hernia; no palpable hepatosplenomegaly MSK: Normal range of motion of extremities; no clubbing/cyanosis Psychiatric: Appropriate affect; alert and oriented x3 Lymphatic: No palpable cervical or axillary lymphadenopathy  No results found for this or any previous visit (from the past 24 hour(s)).  Imaging Orders  No imaging studies ordered today     Assessment and Plan   [name] is an 75 y.o. male with a large left inguinoscrotal hernia.  I recommended laparoscopic repair.  The procedure itself as well as its risks, benefits and alternatives were discussed.  After a full discussion and all questions answered, the patient granted consent to proceed.  We will proceed to the OR as scheduled.  Felicie Morn, MD  [facility] Use [email or web address] to contact on call provider

## 2021-01-30 ENCOUNTER — Encounter (HOSPITAL_BASED_OUTPATIENT_CLINIC_OR_DEPARTMENT_OTHER): Payer: Self-pay | Admitting: Surgery

## 2021-06-18 ENCOUNTER — Other Ambulatory Visit: Payer: Self-pay | Admitting: Surgery

## 2021-06-18 DIAGNOSIS — G8918 Other acute postprocedural pain: Secondary | ICD-10-CM

## 2021-07-12 ENCOUNTER — Ambulatory Visit
Admission: RE | Admit: 2021-07-12 | Discharge: 2021-07-12 | Disposition: A | Discharge status: Home / Self Care | Payer: Medicare Other | Source: Ambulatory Visit | Attending: Surgery | Admitting: Surgery

## 2021-07-12 DIAGNOSIS — R109 Unspecified abdominal pain: Secondary | ICD-10-CM

## 2021-08-21 ENCOUNTER — Other Ambulatory Visit: Payer: Self-pay | Admitting: Urology

## 2021-09-11 NOTE — Progress Notes (Addendum)
COVID Vaccine Completed:  Yes x2 ?Date COVID Vaccine completed: ?Has received booster:  Yes x1 ?COVID vaccine manufacturer:   Moderna    ? ?Date of COVID positive in last 90 days:  No ? ?PCP -  No PCP ?Cardiologist -  Dixie Dials, MD ? ?Clearance on chart dated 06-12-21 by Dr. Doylene Canard ? ?Chest x-ray - N/A ?EKG - 01-29-21 Epic ?Stress Test - greater than 2 years for physicial ?ECHO - N/A ?Cardiac Cath - N/A ?Pacemaker/ICD device last checked: ?Spinal Cord Stimulator: ? ?Bowel Prep - N/A ? ?Sleep Study - N/A ?CPAP -  ? ?Fasting Blood Sugar - N/A ?Checks Blood Sugar _____ times a day ? ?Blood Thinner Instructions:  Coumadin.  Okay to hold x5 days.  Patient is aware   ?Aspirin Instructions: ?Last Dose: ? ?Activity level:   Can go up a flight of stairs and perform activities of daily living without stopping and without symptoms of chest pain or shortness of breath. ?   ?Anesthesia review:   Lymphedema (swelling in bilateral legs, right is worse than left), currently no open sores.  HTN, CKD and poor circulation ? ?Patient denies shortness of breath, fever, cough and chest pain at PAT appointment (completed over the phone) ? ?Patient verbalized understanding of instructions that were given to them at the PAT appointment. Patient was also instructed that they will need to review over the PAT instructions again at home before surgery.  ?

## 2021-09-11 NOTE — Patient Instructions (Addendum)
DUE TO COVID-19 ONLY TWO VISITORS  (aged 78 and older)  IS ALLOWED TO COME WITH YOU AND STAY IN THE WAITING ROOM ONLY DURING PRE OP AND PROCEDURE.   ?**NO VISITORS ARE ALLOWED IN THE SHORT STAY AREA OR RECOVERY ROOM!!** ? ?You are not required to quarantine ?Hand Hygiene often ?Do NOT share personal items ?Notify your provider if you are in close contact with someone who has COVID or you develop fever 100.4 or greater, new onset of sneezing, cough, sore throat, shortness of breath or body aches. ? ?     ? Your procedure is scheduled on:  09-18-21 ? ? Report to [facility] Main Entrance ? ?  Report to admitting at 1:15 PM ? ? Call this number if you have problems the morning of surgery [phone] ? ? Do not eat food :After Midnight. ? ? After Midnight you may have the following liquids until 12:30 PM DAY OF SURGERY ? ?Water ?Black Coffee (sugar ok, NO MILK/CREAM OR CREAMERS)  ?Tea (sugar ok, NO MILK/CREAM OR CREAMERS) regular and decaf                             ?Plain Jell-O (NO RED)                                           ?Fruit ices (not with fruit pulp, NO RED)                                     ?Popsicles (NO RED)                                                                  ?Juice: apple, WHITE grape, WHITE cranberry ?Sports drinks like Gatorade (NO RED) ?Clear broth(vegetable,chicken,beef) ? ?         ?FOLLOW ANY ADDITIONAL PRE OP INSTRUCTIONS YOU RECEIVED FROM YOUR SURGEON'S OFFICE!!! ?  ?  ?Oral Hygiene is also important to reduce your risk of infection.                                    ?Remember - BRUSH YOUR TEETH THE MORNING OF SURGERY WITH YOUR REGULAR TOOTHPASTE ? ? Do NOT smoke after Midnight ? ? Take these medicines the morning of surgery with A SIP OF WATER:  Hydralazine, Oxycodone if needed ? ? Coumadin - hold 5 days prior to surgery  ?                 ?           You may not have any metal on your body including  jewelry, and body piercing ? ?           Do not wear  lotions,  powders, cologne, or deodorant ? ?            Men may shave face and neck. ? ? Do not bring valuables to the hospital. [facility] IS NOT RESPONSIBLE   FOR VALUABLES. ? ? Contacts, dentures or bridgework may not be worn into surgery. ? ?Patients discharged on the day of surgery will not be allowed to drive home.  Someone NEEDS to stay with you for the first 24 hours after anesthesia. ? ?Please read over the following fact sheets you were given: IF [place] [phone] ? ?[place] - Preparing for Surgery ?Before surgery, you can play an important role.  Because skin is not sterile, your skin needs to be as free of germs as possible.  You can reduce the number of germs on your skin by washing with CHG (chlorahexidine gluconate) soap before surgery.  CHG is an antiseptic cleaner which kills germs and bonds with the skin to continue killing germs even after washing. ?Please DO NOT use if you have an allergy to CHG or antibacterial soaps.  If your skin becomes reddened/irritated stop using the CHG and inform your nurse when you arrive at Short Stay. ?Do not shave (including legs and underarms) for at least 48 hours prior to the first CHG shower.  You may shave your face/neck. ? ?Please follow these instructions carefully: ? 1.  Shower with CHG Soap the night before surgery and the  morning of surgery. ? 2.  If you choose to wash your hair, wash your hair first as usual with your normal  shampoo. ? 3.  After you shampoo, rinse your hair and body thoroughly to remove the shampoo.                            ? 4.  Use CHG as you would any other liquid soap.  You can apply chg directly to the skin and wash.  Gently with a scrungie or clean washcloth. ? 5.  Apply the CHG Soap to your body ONLY FROM THE NECK DOWN.   Do   not use on face/ open      ?                     Wound or open sores. Avoid contact with eyes, ears mouth and   genitals (private parts).  ?                      Production manager,  Genitals (private parts) with your normal soap. ?            6.  Wash thoroughly, paying special attention to the area where your    surgery  will be performed. ? 7.  Thoroughly rinse your body with warm water from the neck down. ? 8.  DO NOT shower/wash with your normal soap after using and rinsing off the CHG Soap. ?               9.  Pat yourself dry with a clean towel. ?           10.  Wear clean pajamas. ?           11.  Place clean sheets on your bed the night of your first shower and do not  sleep with pets. ?Day of Surgery : ?Do not apply any lotions/deodorants the morning of surgery.  Please wear clean clothes to the hospital/surgery center. ? ?FAILURE TO FOLLOW THESE INSTRUCTIONS MAY RESULT IN THE CANCELLATION OF YOUR SURGERY ? ?PATIENT SIGNATURE_________________________________ ? ?NURSE SIGNATURE__________________________________ ?  ?

## 2021-09-12 ENCOUNTER — Encounter (HOSPITAL_COMMUNITY)
Admission: RE | Admit: 2021-09-12 | Discharge: 2021-09-12 | Disposition: A | Discharge status: Home / Self Care | Payer: Medicare Other | Source: Ambulatory Visit | Attending: Urology | Admitting: Urology

## 2021-09-12 ENCOUNTER — Encounter (HOSPITAL_COMMUNITY): Payer: Self-pay

## 2021-09-12 ENCOUNTER — Other Ambulatory Visit: Payer: Self-pay

## 2021-09-12 VITALS — Ht 74.0 in | Wt 260.0 lb

## 2021-09-12 DIAGNOSIS — N289 Disorder of kidney and ureter, unspecified: Secondary | ICD-10-CM

## 2021-09-12 DIAGNOSIS — Z7901 Long term (current) use of anticoagulants: Secondary | ICD-10-CM

## 2021-09-12 DIAGNOSIS — Z01818 Encounter for other preprocedural examination: Secondary | ICD-10-CM

## 2021-09-12 DIAGNOSIS — I2510 Atherosclerotic heart disease of native coronary artery without angina pectoris: Secondary | ICD-10-CM

## 2021-09-12 HISTORY — DX: Unspecified osteoarthritis, unspecified site: M19.90

## 2021-09-12 HISTORY — DX: Lymphedema, not elsewhere classified: I89.0

## 2021-09-12 HISTORY — DX: Anemia, unspecified: D64.9

## 2021-09-12 NOTE — Progress Notes (Signed)
Patient was a no show for presurgery appointment.  Able to speak to patient over the phone, he thought appointment was canceled.  History and instructions completed over the phone, he will get labs day of surgery. ?

## 2021-09-18 ENCOUNTER — Ambulatory Visit (HOSPITAL_COMMUNITY)
Admission: RE | Admit: 2021-09-18 | Discharge: 2021-09-18 | Disposition: A | Discharge status: Home / Self Care | Payer: Medicare Other | Source: Ambulatory Visit | Attending: Urology | Admitting: Urology

## 2021-09-18 ENCOUNTER — Encounter (HOSPITAL_COMMUNITY): Payer: Self-pay | Admitting: Urology

## 2021-09-18 ENCOUNTER — Ambulatory Visit (HOSPITAL_COMMUNITY): Payer: Medicare Other | Admitting: Physician Assistant

## 2021-09-18 ENCOUNTER — Ambulatory Visit (HOSPITAL_BASED_OUTPATIENT_CLINIC_OR_DEPARTMENT_OTHER): Payer: Medicare Other | Admitting: Anesthesiology

## 2021-09-18 ENCOUNTER — Encounter (HOSPITAL_COMMUNITY)
Admission: RE | Disposition: A | Discharge status: Home / Self Care | Payer: Self-pay | Source: Ambulatory Visit | Attending: Urology

## 2021-09-18 ENCOUNTER — Other Ambulatory Visit: Payer: Self-pay

## 2021-09-18 DIAGNOSIS — Z8546 Personal history of malignant neoplasm of prostate: Secondary | ICD-10-CM | POA: Diagnosis not present

## 2021-09-18 DIAGNOSIS — Z79899 Other long term (current) drug therapy: Secondary | ICD-10-CM | POA: Insufficient documentation

## 2021-09-18 DIAGNOSIS — N1831 Chronic kidney disease, stage 3a: Secondary | ICD-10-CM | POA: Diagnosis not present

## 2021-09-18 DIAGNOSIS — N433 Hydrocele, unspecified: Secondary | ICD-10-CM | POA: Insufficient documentation

## 2021-09-18 DIAGNOSIS — I130 Hypertensive heart and chronic kidney disease with heart failure and stage 1 through stage 4 chronic kidney disease, or unspecified chronic kidney disease: Secondary | ICD-10-CM | POA: Insufficient documentation

## 2021-09-18 DIAGNOSIS — I129 Hypertensive chronic kidney disease with stage 1 through stage 4 chronic kidney disease, or unspecified chronic kidney disease: Secondary | ICD-10-CM

## 2021-09-18 DIAGNOSIS — D631 Anemia in chronic kidney disease: Secondary | ICD-10-CM

## 2021-09-18 DIAGNOSIS — Z01818 Encounter for other preprocedural examination: Secondary | ICD-10-CM

## 2021-09-18 DIAGNOSIS — Z86718 Personal history of other venous thrombosis and embolism: Secondary | ICD-10-CM | POA: Insufficient documentation

## 2021-09-18 DIAGNOSIS — N289 Disorder of kidney and ureter, unspecified: Secondary | ICD-10-CM

## 2021-09-18 DIAGNOSIS — N189 Chronic kidney disease, unspecified: Secondary | ICD-10-CM | POA: Diagnosis not present

## 2021-09-18 DIAGNOSIS — Z7901 Long term (current) use of anticoagulants: Secondary | ICD-10-CM

## 2021-09-18 HISTORY — PX: ORCHIOPEXY: SHX479

## 2021-09-18 HISTORY — PX: HYDROCELE EXCISION: SHX482

## 2021-09-18 LAB — BASIC METABOLIC PANEL
Anion gap: 8 (ref 5–15)
BUN: 24 mg/dL — ABNORMAL HIGH (ref 8–23)
CO2: 23 mmol/L (ref 22–32)
Calcium: 9.2 mg/dL (ref 8.9–10.3)
Chloride: 108 mmol/L (ref 98–111)
Creatinine, Ser: 1.66 mg/dL — ABNORMAL HIGH (ref 0.61–1.24)
GFR, Estimated: 42 mL/min — ABNORMAL LOW (ref >60)
Glucose, Bld: 92 mg/dL (ref 70–99)
Potassium: 3.9 mmol/L (ref 3.5–5.1)
Sodium: 139 mmol/L (ref 135–145)

## 2021-09-18 LAB — CBC
HCT: 32.5 % — ABNORMAL LOW (ref 39.0–52.0)
Hemoglobin: 9.8 g/dL — ABNORMAL LOW (ref 13.0–17.0)
MCH: 26.2 pg (ref 26.0–34.0)
MCHC: 30.2 g/dL (ref 30.0–36.0)
MCV: 86.9 fL (ref 80.0–100.0)
Platelets: 132 10*3/uL — ABNORMAL LOW (ref 150–400)
RBC: 3.74 MIL/uL — ABNORMAL LOW (ref 4.22–5.81)
RDW: 17 % — ABNORMAL HIGH (ref 11.5–15.5)
WBC: 4.7 10*3/uL (ref 4.0–10.5)
nRBC: 0 % (ref 0.0–0.2)

## 2021-09-18 LAB — APTT: aPTT: 24 seconds (ref 24–36)

## 2021-09-18 LAB — PROTIME-INR
INR: 1.1 (ref 0.8–1.2)
Prothrombin Time: 13.8 seconds (ref 11.4–15.2)

## 2021-09-18 SURGERY — HYDROCELECTOMY
Anesthesia: General | Laterality: Left

## 2021-09-18 MED ORDER — LIDOCAINE HCL (PF) 2 % IJ SOLN
INTRAMUSCULAR | Status: AC
  Filled 2021-09-18: qty 5

## 2021-09-18 MED ORDER — PROPOFOL 10 MG/ML IV BOLUS
INTRAVENOUS | Status: DC | PRN
  Administered 2021-09-18: 180 mg via INTRAVENOUS

## 2021-09-18 MED ORDER — PHENYLEPHRINE HCL-NACL 20-0.9 MG/250ML-% IV SOLN
INTRAVENOUS | Status: DC | PRN
  Administered 2021-09-18: 25 ug/min via INTRAVENOUS

## 2021-09-18 MED ORDER — OXYCODONE HCL 5 MG PO TABS
ORAL_TABLET | ORAL | Status: AC
  Filled 2021-09-18: qty 1

## 2021-09-18 MED ORDER — ONDANSETRON HCL 4 MG/2ML IJ SOLN
INTRAMUSCULAR | Status: AC
  Filled 2021-09-18: qty 2

## 2021-09-18 MED ORDER — BUPIVACAINE HCL (PF) 0.25 % IJ SOLN
INTRAMUSCULAR | Status: DC | PRN
  Administered 2021-09-18: 20 mL

## 2021-09-18 MED ORDER — PROPOFOL 10 MG/ML IV BOLUS
INTRAVENOUS | Status: AC
Start: 2021-09-18 — End: ?
  Filled 2021-09-18: qty 20

## 2021-09-18 MED ORDER — FENTANYL CITRATE PF 50 MCG/ML IJ SOSY
PREFILLED_SYRINGE | INTRAMUSCULAR | Status: AC
  Administered 2021-09-18: 50 ug via INTRAVENOUS
  Filled 2021-09-18: qty 2

## 2021-09-18 MED ORDER — DEXAMETHASONE SODIUM PHOSPHATE 10 MG/ML IJ SOLN
INTRAMUSCULAR | Status: DC | PRN
  Administered 2021-09-18: 5 mg via INTRAVENOUS

## 2021-09-18 MED ORDER — OXYCODONE HCL 5 MG/5ML PO SOLN: 5.0000 mg | Freq: Once | ORAL | Status: AC | PRN

## 2021-09-18 MED ORDER — ONDANSETRON HCL 4 MG/2ML IJ SOLN: 4.0000 mg | Freq: Once | INTRAMUSCULAR | Status: DC | PRN

## 2021-09-18 MED ORDER — LACTATED RINGERS IV SOLN: INTRAVENOUS | Status: DC

## 2021-09-18 MED ORDER — LIDOCAINE 2% (20 MG/ML) 5 ML SYRINGE
INTRAMUSCULAR | Status: DC | PRN
  Administered 2021-09-18: 80 mg via INTRAVENOUS

## 2021-09-18 MED ORDER — FENTANYL CITRATE (PF) 250 MCG/5ML IJ SOLN
INTRAMUSCULAR | Status: DC | PRN
  Administered 2021-09-18 (×2): 50 ug via INTRAVENOUS

## 2021-09-18 MED ORDER — FENTANYL CITRATE PF 50 MCG/ML IJ SOSY
25.0000 ug | PREFILLED_SYRINGE | INTRAMUSCULAR | Status: DC | PRN
  Administered 2021-09-18: 50 ug via INTRAVENOUS

## 2021-09-18 MED ORDER — FENTANYL CITRATE (PF) 100 MCG/2ML IJ SOLN
INTRAMUSCULAR | Status: AC
  Filled 2021-09-18: qty 2

## 2021-09-18 MED ORDER — FENTANYL CITRATE (PF) 250 MCG/5ML IJ SOLN
INTRAMUSCULAR | Status: AC
  Filled 2021-09-18: qty 5

## 2021-09-18 MED ORDER — DEXAMETHASONE SODIUM PHOSPHATE 10 MG/ML IJ SOLN
INTRAMUSCULAR | Status: AC
  Filled 2021-09-18: qty 1

## 2021-09-18 MED ORDER — MIDAZOLAM HCL 2 MG/2ML IJ SOLN
INTRAMUSCULAR | Status: AC
  Filled 2021-09-18: qty 2

## 2021-09-18 MED ORDER — PHENYLEPHRINE HCL (PRESSORS) 10 MG/ML IV SOLN
INTRAVENOUS | Status: AC
  Filled 2021-09-18: qty 1

## 2021-09-18 MED ORDER — ORAL CARE MOUTH RINSE: 15.0000 mL | Freq: Once | OROMUCOSAL | Status: AC

## 2021-09-18 MED ORDER — CHLORHEXIDINE GLUCONATE 0.12 % MT SOLN
15.0000 mL | Freq: Once | OROMUCOSAL | Status: AC
  Administered 2021-09-18: 15 mL via OROMUCOSAL

## 2021-09-18 MED ORDER — BUPIVACAINE HCL (PF) 0.25 % IJ SOLN
INTRAMUSCULAR | Status: AC
  Filled 2021-09-18: qty 30

## 2021-09-18 MED ORDER — AMISULPRIDE (ANTIEMETIC) 5 MG/2ML IV SOLN: 10.0000 mg | Freq: Once | INTRAVENOUS | Status: DC | PRN

## 2021-09-18 MED ORDER — CLINDAMYCIN PHOSPHATE 600 MG/50ML IV SOLN
600.0000 mg | INTRAVENOUS | Status: AC
  Administered 2021-09-18: 600 mg via INTRAVENOUS
  Filled 2021-09-18: qty 50

## 2021-09-18 MED ORDER — ONDANSETRON HCL 4 MG/2ML IJ SOLN
INTRAMUSCULAR | Status: DC | PRN
  Administered 2021-09-18: 4 mg via INTRAVENOUS

## 2021-09-18 MED ORDER — MIDAZOLAM HCL 2 MG/2ML IJ SOLN
INTRAMUSCULAR | Status: DC | PRN
Start: 2021-09-18 — End: 2021-09-18
  Administered 2021-09-18: .5 mg via INTRAVENOUS

## 2021-09-18 MED ORDER — ACETAMINOPHEN 500 MG PO TABS
1000.0000 mg | ORAL_TABLET | Freq: Once | ORAL | Status: AC
  Administered 2021-09-18: 1000 mg via ORAL
  Filled 2021-09-18: qty 2

## 2021-09-18 MED ORDER — FENTANYL CITRATE (PF) 100 MCG/2ML IJ SOLN
INTRAMUSCULAR | Status: DC | PRN
  Administered 2021-09-18 (×2): 50 ug via INTRAVENOUS

## 2021-09-18 MED ORDER — OXYCODONE-ACETAMINOPHEN 5-325 MG PO TABS: 1.0000 | ORAL_TABLET | Freq: Four times a day (QID) | ORAL | 0 refills | Status: DC | PRN

## 2021-09-18 MED ORDER — OXYCODONE HCL 5 MG PO TABS
5.0000 mg | ORAL_TABLET | Freq: Once | ORAL | Status: AC | PRN
  Administered 2021-09-18: 5 mg via ORAL

## 2021-09-18 SURGICAL SUPPLY — 56 items
BAG COUNTER SPONGE SURGICOUNT (BAG) ×2 IMPLANT
BENZOIN TINCTURE PRP APPL 2/3 (GAUZE/BANDAGES/DRESSINGS) ×1 IMPLANT
BLADE CLIPPER SENSICLIP SURGIC (BLADE) ×1 IMPLANT
BLADE HEX COATED 2.75 (ELECTRODE) ×2 IMPLANT
BLADE SURG 15 STRL LF DISP TIS (BLADE) ×1 IMPLANT
BLADE SURG 15 STRL SS (BLADE)
BNDG GAUZE ELAST 4 BULKY (GAUZE/BANDAGES/DRESSINGS) ×1 IMPLANT
COVER BACK TABLE 60X90IN (DRAPES) ×1 IMPLANT
COVER MAYO STAND STRL (DRAPES) ×1 IMPLANT
DERMABOND ADVANCED (GAUZE/BANDAGES/DRESSINGS)
DERMABOND ADVANCED .7 DNX12 (GAUZE/BANDAGES/DRESSINGS) ×1 IMPLANT
DISSECTOR ROUND CHERRY 3/8 STR (MISCELLANEOUS) IMPLANT
DRAIN PENROSE 0.25X18 (DRAIN) ×2 IMPLANT
DRAIN PENROSE 0.5X18 (DRAIN) ×1 IMPLANT
DRAPE LAPAROTOMY T 102X78X121 (DRAPES) ×1 IMPLANT
DRAPE LAPAROTOMY T 98X78 PEDS (DRAPES) ×1 IMPLANT
DRSG TEGADERM 4X4.75 (GAUZE/BANDAGES/DRESSINGS) IMPLANT
ELECT REM PT RETURN 15FT ADLT (MISCELLANEOUS) ×2 IMPLANT
GAUZE SPONGE 4X4 12PLY STRL (GAUZE/BANDAGES/DRESSINGS) ×1 IMPLANT
GLOVE BIO SURGEON STRL SZ7.5 (GLOVE) ×7 IMPLANT
GLOVE SURG LX 7.5 STRW (GLOVE) ×10
GLOVE SURG LX STRL 7.5 STRW (GLOVE) ×1 IMPLANT
KIT BASIN OR (CUSTOM PROCEDURE TRAY) ×1 IMPLANT
KIT TURNOVER KIT A (KITS) ×1 IMPLANT
NDL HYPO 25X1 1.5 SAFETY (NEEDLE) ×1 IMPLANT
NEEDLE HYPO 22GX1.5 SAFETY (NEEDLE) ×1 IMPLANT
NEEDLE HYPO 25X1 1.5 SAFETY (NEEDLE) ×2 IMPLANT
NS IRRIG 1000ML POUR BTL (IV SOLUTION) ×1 IMPLANT
PACK BASIC VI WITH GOWN DISP (CUSTOM PROCEDURE TRAY) ×1 IMPLANT
PACK GENERAL/GYN (CUSTOM PROCEDURE TRAY) ×1 IMPLANT
PANTS MESH DISP LRG (UNDERPADS AND DIAPERS) ×1 IMPLANT
PANTS MESH DISPOSABLE L (UNDERPADS AND DIAPERS) ×1
PENCIL SMOKE EVACUATOR (MISCELLANEOUS) IMPLANT
SPONGE T-LAP 4X18 ~~LOC~~+RFID (SPONGE) ×3 IMPLANT
SUPPORT SCROTAL LG STRP (MISCELLANEOUS) ×1 IMPLANT
SUT ETHILON 3 0 PS 1 (SUTURE) ×1 IMPLANT
SUT ETHILON 4 0 PS 2 18 (SUTURE) ×2 IMPLANT
SUT MNCRL AB 4-0 PS2 18 (SUTURE) ×1 IMPLANT
SUT PROLENE 4 0 PS 2 18 (SUTURE) ×1 IMPLANT
SUT SILK 0 (SUTURE)
SUT SILK 0 30XBRD TIE 6 (SUTURE) ×1 IMPLANT
SUT VIC AB 0 CT1 36 (SUTURE) ×1 IMPLANT
SUT VIC AB 2-0 SH 27 (SUTURE)
SUT VIC AB 2-0 SH 27XBRD (SUTURE) IMPLANT
SUT VIC AB 3-0 SH 27 (SUTURE) ×2
SUT VIC AB 3-0 SH 27X BRD (SUTURE) IMPLANT
SUT VIC AB 3-0 SH 27XBRD (SUTURE) ×1 IMPLANT
SUT VIC AB 4-0 SH 27 (SUTURE)
SUT VIC AB 4-0 SH 27XANBCTRL (SUTURE) ×1 IMPLANT
SYR 20ML LL LF (SYRINGE) ×1 IMPLANT
SYR CONTROL 10ML LL (SYRINGE) ×2 IMPLANT
TOWEL OR 17X26 10 PK STRL BLUE (TOWEL DISPOSABLE) ×3 IMPLANT
TUBING CONNECTING 10 (TUBING) ×1 IMPLANT
WATER STERILE IRR 1000ML POUR (IV SOLUTION) IMPLANT
WATER STERILE IRR 500ML POUR (IV SOLUTION) IMPLANT
YANKAUER SUCT BULB TIP NO VENT (SUCTIONS) ×1 IMPLANT

## 2021-09-18 NOTE — Transfer of Care (Signed)
Immediate Anesthesia Transfer of Care Note ? ?Patient: [name] ? ?Procedure(s) Performed: HYDROCELECTOMY ADULT (Left) ?ORCHIOPEXY ADULT (Left) ? ?Patient Location: PACU ? ?Anesthesia Type:General ? ?Level of Consciousness: sedated ? ?Airway & Oxygen Therapy: Patient Spontanous Breathing and Patient connected to face mask oxygen ? ?Post-op Assessment: Report given to RN and Post -op Vital signs reviewed and stable ? ?Post vital signs: Reviewed and stable ? ?Last Vitals:  ?Vitals Value Taken Time  ?BP 170/85 09/18/21 1721  ?Temp    ?Pulse 68 09/18/21 1723  ?Resp 13 09/18/21 1723  ?SpO2 100 % 09/18/21 1723  ?Vitals shown include unvalidated device data. ? ?Last Pain:  ?Vitals:  ? 09/18/21 1358  ?TempSrc:   ?PainSc: 0-No pain  ?   ? ?Patients Stated Pain Goal: 3 (09/18/21 1358) ? ?Complications: No notable events documented. ?

## 2021-09-18 NOTE — Discharge Instructions (Signed)
1- Drain Sites - You may have some mild persistent drainage from old drain site for several days, this is normal. This can be covered with cotton gauze for convenience.  2 - Stiches - Your stitches are all dissolvable. You may notice a "loose thread" at your incisions, these are normal and require no intervention. You may cut them flush to the skin with fingernail clippers if needed for comfort.  3 - Diet - No restrictions  4 - Activity - No heavy lifting / straining (any activities that require valsalva or "bearing down") x 4 weeks. Otherwise, no restrictions.  5 - Bathing - You may shower immediately. Do not take a bath or get into swimming pool where incision sites are submersed in water x 4 weeks.   6 -  When to Call the Doctor - Call MD for any fever >102, any acute wound problems, or any severe nausea / vomiting. You can call the [facility] ([phone]) 24 hours a day 365 days a year. It will roll-over to the answering service and on-call physician after hours.  

## 2021-09-18 NOTE — H&P (Signed)
[name] is an 76 y.o. male.   ? ?Chief Complaint: Pre-Op LEFT Hydrocelectomy / orchidopexy ? ?HPI:  ? ?1 - High Risk Prostate Cancer - s/p robotic prostatectomy 12/30/13 for pT3aN0Mx Gleason 7 adenocarcinoma with POSITIVE bladder neck and apical margins due to direct microscopic smooth muscle invasion of prostate cancer at these levels (making it T3). Initial PSA 26.96. Staging Bone scan and CT negative for metastatic disease. ? ? ?2 - Left Hydrocele - Pt with increasing bother from scrotal swelling after succesful left inguinal hernia repair 2022 by gen surg. CR 05/2021 with large left hydrocele, no hernia recurrence. He also has severe LE edema due to CHF / vein stripping.  ? ?Today "Ad" is seen to proceed with LEFT hydrocelectomy for bothersome hydrocele. No interval fevers. He has held coumadin as directed by Dr. Raliegh Ip.  ? ?Past Medical History:  ?Diagnosis Date  ? Anemia   ? Arthritis   ? CKD STAGE 3A   ? PER DR. Vikki Ports [facility] ON 01/10/2021 OFFICE VISIT  ? Difficulty sleeping   ? DVT (deep venous thrombosis) ([place])   ? rt leg 3 yrs ago  ? Hypertension   ? Leg cramps   ? Lymphedema   ? Prostate cancer ([place])   ? Swelling of lower extremity   ? RT LEG  ? Varicose veins   ? both legs when he was 76 yrs old. 01/25/2021  ? Wears glasses   ? wears glasses for driving.  01/25/2021  ? ? ?Past Surgical History:  ?Procedure Laterality Date  ? HERNIA REPAIR  05/19/2010  ? INGUINAL  ? INGUINAL HERNIA REPAIR Left 01/29/2021  ? Procedure: LAPAROSCOPIC LEFT INGUINAL HERNIA REPAIR WITH MESH;  Surgeon: Stechschulte, Nickola Major, MD;  Location: [facility];  Service: General;  Laterality: Left;  ? LYMPHADENECTOMY Bilateral 12/30/2013  ? Procedure: PELVIC LYMPH NODE DISSECTION;  Surgeon: Alexis Frock, MD;  Location: [facility];  Service: Urology;  Laterality: Bilateral;  ? NASAL SINUS SURGERY    ? PROSTATE BIOPSY    ? ROBOT ASSISTED LAPAROSCOPIC RADICAL PROSTATECTOMY N/A 12/30/2013  ? Procedure: ROBOTIC ASSISTED  LAPAROSCOPIC RADICAL PROSTATECTOMY WITH INDOCYANINE GREEN DYE;  Surgeon: Alexis Frock, MD;  Location: [facility];  Service: Urology;  Laterality: N/A;  ? VEIN SURGERY    ? RT LEG  had surgery 36 yrs ago. 01/25/2021  ? ? ?No family history on file. ?Social History:  reports that he has never smoked. He has never used smokeless tobacco. He reports that he does not drink alcohol and does not use drugs. ? ?Allergies:  ?Allergies  ?Allergen Reactions  ? Cephalexin Nausea And Vomiting  ?  patient only took one pill and stated that it made him vomit  ? Mometasone Other (See Comments)  ?  patient states the cream made sores on his leg  ? ? ?No medications prior to admission.  ? ? ?No results found for this or any previous visit (from the past 48 hour(s)). ?No results found. ? ?Review of Systems  ?Constitutional:  Negative for chills and fever.  ?Genitourinary:  Positive for scrotal swelling.  ?All other systems reviewed and are negative. ? ?There were no vitals taken for this visit. ?Physical Exam ?Vitals reviewed.  ?HENT:  ?   Head: Normocephalic.  ?   Nose: Nose normal.  ?Eyes:  ?   Pupils: Pupils are equal, round, and reactive to light.  ?Cardiovascular:  ?   Rate and Rhythm: Normal rate.  ?Abdominal:  ?   General:  Abdomen is flat.  ?   Comments: Stable truncal obesity. Prior scars w/o hernias.   ?Genitourinary: ?   Comments: Stable large left hydrocele.  ?Musculoskeletal:  ?   Cervical back: Normal range of motion.  ?Neurological:  ?   General: No focal deficit present.  ?   Mental Status: He is alert.  ?  ? ?Assessment/Plan ?\ ?Proceed as planned with LEFT hydrocelectomy / orchidopexy for large, simple hydrocele. Risks, benefits, alternatives, expected peri-op course including temporary post-op drain discussed previously and reiterated today.  ? ?Alexis Frock, MD ?09/18/2021, 5:43 AM ? ? ? ?

## 2021-09-18 NOTE — Anesthesia Procedure Notes (Signed)
Procedure Name: LMA Insertion ?Date/Time: 09/18/2021 4:24 PM ?Performed by: Cynda Familia, CRNA ?Pre-anesthesia Checklist: Patient identified, Emergency Drugs available, Suction available and Patient being monitored ?Patient Re-evaluated:Patient Re-evaluated prior to induction ?Oxygen Delivery Method: Circle System Utilized ?Preoxygenation: Pre-oxygenation with 100% oxygen ?Induction Type: IV induction ?Ventilation: Mask ventilation without difficulty ?LMA: LMA inserted and LMA with gastric port inserted ?LMA Size: 5.0 ?Number of attempts: 1 ?Airway Equipment and Method: Bite block ?Placement Confirmation: positive ETCO2 ?Tube secured with: Tape ?Dental Injury: Teeth and Oropharynx as per pre-operative assessment  ?Comments: Bass induction- LMA inserted AM CRNA atraumatic-- teeth and mouth as preop-- malformed right front tooth as preop- bilat BS ? ? ? ? ?

## 2021-09-18 NOTE — Brief Op Note (Signed)
09/18/2021 ? ?5:10 PM ? ?PATIENT:  [name]  76 y.o. male ? ?PRE-OPERATIVE DIAGNOSIS:  LEFT HYDROCELE ? ?POST-OPERATIVE DIAGNOSIS:  left hydrocele ? ?PROCEDURE:  Procedure(s) with comments: ?HYDROCELECTOMY ADULT (Left) - 1 HR ?ORCHIOPEXY ADULT (Left) ? ?SURGEON:  Surgeon(s) and Role: ?   Alexis Frock, MD - Primary ? ?PHYSICIAN ASSISTANT:  ? ?ASSISTANTS: none  ? ?ANESTHESIA:   local and general ? ?EBL:  20 mL  ? ?BLOOD ADMINISTERED:none ? ?DRAINS: Penrose drain in the dependant scrotum   ? ?LOCAL MEDICATIONS USED:  MARCAINE    ? ?SPECIMEN:  Source of Specimen:  hydrocele sac and excess scrotal skin ? ?DISPOSITION OF SPECIMEN:   discard ? ?COUNTS:  YES ? ?TOURNIQUET:  * No tourniquets in log * ? ?DICTATION: .[id number] ? ?PLAN OF CARE: Discharge to home after PACU ? ?PATIENT DISPOSITION:  PACU - hemodynamically stable. ?  ?Delay start of Pharmacological VTE agent (>24hrs) due to surgical blood loss or risk of bleeding: yes ? ?

## 2021-09-18 NOTE — Anesthesia Postprocedure Evaluation (Signed)
Anesthesia Post Note ? ?Patient: [name] ? ?Procedure(s) Performed: HYDROCELECTOMY ADULT (Left) ?ORCHIOPEXY ADULT (Left) ? ?  ? ?Patient location during evaluation: PACU ?Anesthesia Type: General ?Level of consciousness: awake and alert ?Pain management: pain level controlled ?Vital Signs Assessment: post-procedure vital signs reviewed and stable ?Respiratory status: spontaneous breathing, nonlabored ventilation, respiratory function stable and patient connected to nasal cannula oxygen ?Cardiovascular status: blood pressure returned to baseline and stable ?Postop Assessment: no apparent nausea or vomiting ?Anesthetic complications: no ? ? ?No notable events documented. ? ?Last Vitals:  ?Vitals:  ? 09/18/21 1815 09/18/21 1820  ?BP: (!) 195/93 (!) 190/90  ?Pulse: 74 74  ?Resp: 16 12  ?Temp: 36.5 ?C 36.4 ?C  ?SpO2: 96% 98%  ?  ?Last Pain:  ?Vitals:  ? 09/18/21 1820  ?TempSrc: Oral  ?PainSc: 3   ? ? ?  ?  ?  ?  ?  ?  ? ?Muscab Brenneman ? ? ? ? ?

## 2021-09-18 NOTE — Anesthesia Procedure Notes (Signed)
Date/Time: 09/18/2021 5:12 PM ?Performed by: Cynda Familia, CRNA ?Oxygen Delivery Method: Simple face mask ?Placement Confirmation: positive ETCO2 and breath sounds checked- equal and bilateral ?Dental Injury: Teeth and Oropharynx as per pre-operative assessment  ? ? ? ? ?

## 2021-09-18 NOTE — Anesthesia Preprocedure Evaluation (Addendum)
Anesthesia Evaluation  ?Patient identified by MRN, date of birth, ID band ?Patient awake ? ? ? ?Reviewed: ?Allergy & Precautions, NPO status , Patient's Chart, lab work & pertinent test results ? ?Airway ?Mallampati: II ? ?TM Distance: >3 FB ?Neck ROM: Full ? ? ? Dental ?no notable dental hx. ? ?  ?Pulmonary ?neg pulmonary ROS,  ?  ?Pulmonary exam normal ?breath sounds clear to auscultation ? ? ? ? ? ? Cardiovascular ?hypertension, Pt. on medications ?+ DVT (history of...)  ?Normal cardiovascular exam ?Rhythm:Regular Rate:Normal ? ? ?  ?Neuro/Psych ?negative neurological ROS ? negative psych ROS  ? GI/Hepatic ?negative GI ROS, Neg liver ROS,   ?Endo/Other  ?negative endocrine ROSObesity 33 ? Renal/GU ?Renal Insufficiency and CRFRenal disease  ?negative genitourinary ?  ?Musculoskeletal ? ?(+) Arthritis ,  ? Abdominal ?  ?Peds ?negative pediatric ROS ?(+)  Hematology ? ?(+) Blood dyscrasia, anemia ,   ?Anesthesia Other Findings ? ? Reproductive/Obstetrics ?negative OB ROS ? ?  ? ? ? ? ? ? ? ? ? ? ? ? ? ?  ?  ? ? ? ? ? ? ?Anesthesia Physical ?Anesthesia Plan ? ?ASA: 3 ? ?Anesthesia Plan: General  ? ?Post-op Pain Management: Tylenol PO (pre-op)* and Minimal or no pain anticipated  ? ?Induction: Intravenous ? ?PONV Risk Score and Plan: 2 and Treatment may vary due to age or medical condition, Midazolam, Ondansetron and Dexamethasone ? ?Airway Management Planned: LMA ? ?Additional Equipment: None ? ?Intra-op Plan:  ? ?Post-operative Plan: Extubation in OR ? ?Informed Consent: I have reviewed the patients History and Physical, chart, labs and discussed the procedure including the risks, benefits and alternatives for the proposed anesthesia with the patient or authorized representative who has indicated his/her understanding and acceptance.  ? ? ? ?Dental advisory given ? ?Plan Discussed with: CRNA, Anesthesiologist and Surgeon ? ?Anesthesia Plan Comments:   ? ? ? ? ? ? ?Anesthesia Quick  Evaluation ? ?

## 2021-09-19 ENCOUNTER — Encounter (HOSPITAL_COMMUNITY): Payer: Self-pay | Admitting: Urology

## 2021-09-19 NOTE — Op Note (Signed)
NAME: [name], [name] ?MEDICAL RECORD NO: [id number] ?ACCOUNT NO: [id number] ?DATE OF BIRTH: Sep 20, 1945 ?FACILITY: [facility] ?LOCATION: [facility] ?PHYSICIAN: Alexis Frock, MD ? ?Operative Report  ? ?DATE OF PROCEDURE: 09/18/2021 ? ?PREOPERATIVE DIAGNOSIS:  Large left hydrocele. ? ?PROCEDURE:   ?1.  Left hydrocelectomy. ?2.  Left orchiopexy. ? ?ESTIMATED BLOOD LOSS:  20 mL ? ?COMPLICATIONS:  None. ? ?SPECIMEN:  Left hydrocele sac and excess scrotal skin for discard. ? ?FINDINGS:   ?1.  Large, non-complex left hydrocele, 220 mL volume. ?2.  Excess testicular mobility following hydrocelectomy as expected necessitating orchiopexy. ? ?DRAIN:  Penrose drain dependent open drainage. ? ?INDICATIONS:  The patient is a very pleasant 76 year old man well known to me with history of prostate cancer that has been under good control.  He also has significant lower extremity massive edema from prior vein stripping.  He was found on workup of  ?scrotal swelling to have significant hydrocele following a successful elective inguinal hernia repair.  This hydrocele was quite bothersome. Options discussed including observation versus aspiration and sclerotherapy versus more definitive management  ?with hydrocelectomy and he wished to proceed with the latter. Given the large size of the hydrocele, it was clearly felt that orchiopexy would be warranted too given the large amount of mobility inherently created.  Informed consent was obtained and  ?placed in medical record. ? ?DESCRIPTION OF PROCEDURE:  The patient being himself verified, procedure being left hydrocelectomy orchiopexy was confirmed.  Procedure timeout was performed.  Intravenous antibiotics were administered.  General anesthesia was induced.  The patient was  ?placed into a supine position.  Sterile field was created, prepped and draped the patient's penis, perineum, and proximal thighs using iodine.  Next, a purposeful elliptical incision was made in the left hemiscrotum  approximately 14 cm in length x 4 cm  ?wide at its midpoint. In this elliptical area, scrotal skin was excised and discarded and dissection carried down through subcutaneous tissues to the level of the large hydrocele sac, which was delivered into the operative field.  Incision was made on  ?the anti-testicular border.  This was drained of 220 mL of simple straw-colored fluid. Hydrocelectomy was then further incised along its axis on the anti-testicular border, then everted so its mucosal surface lay exterior.  This was trimmed to favorable  ?reapproximation size and then reapproximated in the everted position using 0 Vicryl in a running fashion, taking exquisite care to avoid excess tension on the cord structures and as expected, this inherently generated a significant amount of testicular  ?mobility; however, orchiopexy was clearly warranted.  The left testicle was lying into anatomic position with the lateral sulcus lateral and a single 4-0 Prolene stitch was placed from the lateral mesorchium testis into the inner aspect of the lateral  ?left scrotal leaflet taking exquisite care to avoid skin perforation.  This resulted in excellent pexy of the left testicle without any twisting whatsoever. A dependent counterincision was made approximately 4 mm in length, approximately 4 cm away from  ?the skin edge and a 1/4-inch Penrose drain was brought through this and anchored in place using nylon.  The dartos tissue was reapproximated using running Vicryl and the skin reapproximated using running Vicryl, which resulted in excellent skin  ?apposition, hemostasis and cosmesis.  Next, 10 mL of 0.25% plain Marcaine was instilled along the area of incision with additional 10 mL in a cord block type fashion just at the level of the external ring.  Hemostasis was excellent.  Sponge and needle  ?  counts were correct.  We achieved the goals of the surgery today.  A dressing of mesh underwear and fluff dressing was applied.  The  procedure was terminated.  The patient tolerated the procedure well, no immediate perioperative complications.  The  ?patient was taken to postanesthesia care unit in stable condition with plan for discharge home. ? ? ?PAA ?D: 09/18/2021 5:15:55 pm T: 09/19/2021 12:28:00 am  ?JOB: [id number]/ [id number]  ?

## 2022-03-11 ENCOUNTER — Inpatient Hospital Stay (HOSPITAL_COMMUNITY)
Admission: EM | Admit: 2022-03-11 | Discharge: 2022-03-14 | DRG: 291 | Disposition: A | Discharge status: Home / Self Care | Payer: Medicare Other | Attending: Family Medicine | Admitting: Family Medicine

## 2022-03-11 ENCOUNTER — Encounter (HOSPITAL_COMMUNITY): Payer: Self-pay

## 2022-03-11 ENCOUNTER — Emergency Department (HOSPITAL_COMMUNITY): Payer: Medicare Other

## 2022-03-11 ENCOUNTER — Other Ambulatory Visit: Payer: Self-pay

## 2022-03-11 DIAGNOSIS — Z6832 Body mass index (BMI) 32.0-32.9, adult: Secondary | ICD-10-CM | POA: Diagnosis not present

## 2022-03-11 DIAGNOSIS — I081 Rheumatic disorders of both mitral and tricuspid valves: Secondary | ICD-10-CM | POA: Diagnosis present

## 2022-03-11 DIAGNOSIS — I951 Orthostatic hypotension: Secondary | ICD-10-CM | POA: Diagnosis not present

## 2022-03-11 DIAGNOSIS — Z8546 Personal history of malignant neoplasm of prostate: Secondary | ICD-10-CM

## 2022-03-11 DIAGNOSIS — Z7901 Long term (current) use of anticoagulants: Secondary | ICD-10-CM | POA: Diagnosis not present

## 2022-03-11 DIAGNOSIS — Z9079 Acquired absence of other genital organ(s): Secondary | ICD-10-CM | POA: Diagnosis not present

## 2022-03-11 DIAGNOSIS — T502X5A Adverse effect of carbonic-anhydrase inhibitors, benzothiadiazides and other diuretics, initial encounter: Secondary | ICD-10-CM | POA: Diagnosis present

## 2022-03-11 DIAGNOSIS — M25561 Pain in right knee: Secondary | ICD-10-CM | POA: Diagnosis present

## 2022-03-11 DIAGNOSIS — N433 Hydrocele, unspecified: Secondary | ICD-10-CM | POA: Diagnosis present

## 2022-03-11 DIAGNOSIS — Z23 Encounter for immunization: Secondary | ICD-10-CM

## 2022-03-11 DIAGNOSIS — I130 Hypertensive heart and chronic kidney disease with heart failure and stage 1 through stage 4 chronic kidney disease, or unspecified chronic kidney disease: Secondary | ICD-10-CM | POA: Diagnosis present

## 2022-03-11 DIAGNOSIS — Z881 Allergy status to other antibiotic agents status: Secondary | ICD-10-CM | POA: Diagnosis not present

## 2022-03-11 DIAGNOSIS — I5033 Acute on chronic diastolic (congestive) heart failure: Secondary | ICD-10-CM | POA: Diagnosis present

## 2022-03-11 DIAGNOSIS — E669 Obesity, unspecified: Secondary | ICD-10-CM | POA: Diagnosis present

## 2022-03-11 DIAGNOSIS — M1990 Unspecified osteoarthritis, unspecified site: Secondary | ICD-10-CM | POA: Diagnosis present

## 2022-03-11 DIAGNOSIS — I071 Rheumatic tricuspid insufficiency: Secondary | ICD-10-CM

## 2022-03-11 DIAGNOSIS — I2489 Other forms of acute ischemic heart disease: Secondary | ICD-10-CM | POA: Diagnosis present

## 2022-03-11 DIAGNOSIS — I872 Venous insufficiency (chronic) (peripheral): Secondary | ICD-10-CM | POA: Diagnosis present

## 2022-03-11 DIAGNOSIS — E8770 Fluid overload, unspecified: Secondary | ICD-10-CM | POA: Diagnosis present

## 2022-03-11 DIAGNOSIS — N1832 Chronic kidney disease, stage 3b: Secondary | ICD-10-CM | POA: Diagnosis present

## 2022-03-11 DIAGNOSIS — I340 Nonrheumatic mitral (valve) insufficiency: Secondary | ICD-10-CM

## 2022-03-11 DIAGNOSIS — Z888 Allergy status to other drugs, medicaments and biological substances status: Secondary | ICD-10-CM

## 2022-03-11 DIAGNOSIS — R300 Dysuria: Secondary | ICD-10-CM | POA: Diagnosis present

## 2022-03-11 DIAGNOSIS — Z86718 Personal history of other venous thrombosis and embolism: Secondary | ICD-10-CM | POA: Diagnosis not present

## 2022-03-11 DIAGNOSIS — I890 Lymphedema, not elsewhere classified: Secondary | ICD-10-CM | POA: Diagnosis present

## 2022-03-11 DIAGNOSIS — I10 Essential (primary) hypertension: Secondary | ICD-10-CM | POA: Diagnosis not present

## 2022-03-11 DIAGNOSIS — Z79899 Other long term (current) drug therapy: Secondary | ICD-10-CM

## 2022-03-11 LAB — CBC
HCT: 32.8 % — ABNORMAL LOW (ref 39.0–52.0)
Hemoglobin: 10.3 g/dL — ABNORMAL LOW (ref 13.0–17.0)
MCH: 27.5 pg (ref 26.0–34.0)
MCHC: 31.4 g/dL (ref 30.0–36.0)
MCV: 87.7 fL (ref 80.0–100.0)
Platelets: 154 10*3/uL (ref 150–400)
RBC: 3.74 MIL/uL — ABNORMAL LOW (ref 4.22–5.81)
RDW: 16.1 % — ABNORMAL HIGH (ref 11.5–15.5)
WBC: 6.3 10*3/uL (ref 4.0–10.5)
nRBC: 0 % (ref 0.0–0.2)

## 2022-03-11 LAB — BASIC METABOLIC PANEL
Anion gap: 7 (ref 5–15)
BUN: 33 mg/dL — ABNORMAL HIGH (ref 8–23)
CO2: 22 mmol/L (ref 22–32)
Calcium: 9.2 mg/dL (ref 8.9–10.3)
Chloride: 110 mmol/L (ref 98–111)
Creatinine, Ser: 1.67 mg/dL — ABNORMAL HIGH (ref 0.61–1.24)
GFR, Estimated: 42 mL/min — ABNORMAL LOW (ref >60)
Glucose, Bld: 127 mg/dL — ABNORMAL HIGH (ref 70–99)
Potassium: 4.2 mmol/L (ref 3.5–5.1)
Sodium: 139 mmol/L (ref 135–145)

## 2022-03-11 LAB — TROPONIN I (HIGH SENSITIVITY)
Troponin I (High Sensitivity): 19 ng/L — ABNORMAL HIGH (ref <18)
Troponin I (High Sensitivity): 20 ng/L — ABNORMAL HIGH (ref <18)

## 2022-03-11 LAB — BRAIN NATRIURETIC PEPTIDE: B Natriuretic Peptide: 129.2 pg/mL — ABNORMAL HIGH (ref 0.0–100.0)

## 2022-03-11 MED ORDER — ENOXAPARIN SODIUM 60 MG/0.6ML IJ SOSY
60.0000 mg | PREFILLED_SYRINGE | INTRAMUSCULAR | Status: DC
  Administered 2022-03-12: 60 mg via SUBCUTANEOUS
  Filled 2022-03-11 (×2): qty 0.6

## 2022-03-11 MED ORDER — ACETAMINOPHEN 325 MG PO TABS
650.0000 mg | ORAL_TABLET | Freq: Four times a day (QID) | ORAL | Status: DC | PRN
  Administered 2022-03-14 (×2): 650 mg via ORAL
  Filled 2022-03-11 (×2): qty 2

## 2022-03-11 MED ORDER — PROCHLORPERAZINE EDISYLATE 10 MG/2ML IJ SOLN: 5.0000 mg | Freq: Four times a day (QID) | INTRAMUSCULAR | Status: DC | PRN

## 2022-03-11 MED ORDER — FUROSEMIDE 10 MG/ML IJ SOLN
80.0000 mg | Freq: Once | INTRAMUSCULAR | Status: AC
  Administered 2022-03-11: 80 mg via INTRAVENOUS
  Filled 2022-03-11: qty 8

## 2022-03-11 MED ORDER — POLYETHYLENE GLYCOL 3350 17 G PO PACK
17.0000 g | PACK | Freq: Every day | ORAL | Status: DC | PRN
  Administered 2022-03-13: 17 g via ORAL
  Filled 2022-03-11: qty 1

## 2022-03-11 MED ORDER — MELATONIN 5 MG PO TABS: 5.0000 mg | ORAL_TABLET | Freq: Every evening | ORAL | Status: DC | PRN

## 2022-03-11 MED ORDER — IOHEXOL 350 MG/ML SOLN
75.0000 mL | Freq: Once | INTRAVENOUS | Status: AC | PRN
  Administered 2022-03-11: 75 mL via INTRAVENOUS

## 2022-03-11 MED ORDER — IOHEXOL 350 MG/ML SOLN: 80.0000 mL | Freq: Once | INTRAVENOUS | Status: DC | PRN

## 2022-03-11 NOTE — ED Triage Notes (Signed)
Patient has bilateral lower extremity in his lower extremities x 1 week. R>L. Patient also reports increased SOB and chest pain. Patient's daughter reports that his PCP stated that he had approx 75 lbs of excess fluid.

## 2022-03-11 NOTE — H&P (Incomplete)
History and Physical  Kingsley Farace ERD:[id number] DOB: 02/04/1946 DOA: 03/11/2022  Referring physician: Dr. Darl Householder  PCP: Dixie Dials, MD  Outpatient Specialists: Cardiology, wound care specialist, urology. Patient coming from: Home.  Chief Complaint: Legs swelling.  HPI: [name] is a 76 y.o. male with medical history significant for hydrocele, followed by urology, left inguinal hernia status post laparoscopic left inguinal hernia repair, right lower extremity DVT on Coumadin, hypertension, CKD 3, prostate cancer, lymphedema, who presented to [facility] ED with complaints of worsening bilateral lower extremity edema and significant weight gain 25 to 50 pounds in the past month.  Associated with exertional dyspnea.  Can hardly walk as a result of his weight gain.  He presented to the ED for further evaluation.  Work-up in the ED revealed volume overload for which she was started on IV diuresis.  Bilateral JVD noted on exam..  Due to concern for acute CHF, EDP requested admission for further work-up and management of his symptomatology.  The patient was admitted by [facility], hospitalist service.  Also reports pain in his right knee.  No trauma.  X-ray ordered and pending.   Addendum:  Later, the patient informed the nurse of dysuria.  UA and urine culture are pending.  ED Course: Tmax 98.2.  BP 183/77, pulse 96, respiratory 26, O2 saturation 100% on room air.  Lab studies remarkable for serum glucose 127, BUN 33, creatinine 1.67, GFR 42, BNP 129, high-sensitivity troponin 19, repeat 20.  Hemoglobin 10.3, MCV 87, platelet count 154.  WBC 6.3.  Review of Systems: Review of systems as noted in the HPI. All other systems reviewed and are negative.   Past Medical History:  Diagnosis Date   Anemia    Arthritis    CKD STAGE 3A    PER DR. Vikki Ports CONNOR ON 01/10/2021 OFFICE VISIT   Difficulty sleeping    DVT (deep venous thrombosis) (HCC)    rt leg 3 yrs ago   Hypertension    Leg cramps     Lymphedema    Prostate cancer (HCC)    Swelling of lower extremity    RT LEG   Varicose veins    both legs when he was 76 yrs old. 01/25/2021   Wears glasses    wears glasses for driving.  01/25/2021   Past Surgical History:  Procedure Laterality Date   HERNIA REPAIR  05/19/2010   INGUINAL   HYDROCELE EXCISION Left 09/18/2021   Procedure: HYDROCELECTOMY ADULT;  Surgeon: Alexis Frock, MD;  Location: [facility];  Service: Urology;  Laterality: Left;  1 HR   INGUINAL HERNIA REPAIR Left 01/29/2021   Procedure: LAPAROSCOPIC LEFT INGUINAL HERNIA REPAIR WITH MESH;  Surgeon: Stechschulte, Nickola Major, MD;  Location: [place];  Service: General;  Laterality: Left;   LYMPHADENECTOMY Bilateral 12/30/2013   Procedure: PELVIC LYMPH NODE DISSECTION;  Surgeon: Alexis Frock, MD;  Location: [facility];  Service: Urology;  Laterality: Bilateral;   NASAL SINUS SURGERY     ORCHIOPEXY Left 09/18/2021   Procedure: ORCHIOPEXY ADULT;  Surgeon: Alexis Frock, MD;  Location: [facility];  Service: Urology;  Laterality: Left;   PROSTATE BIOPSY     ROBOT ASSISTED LAPAROSCOPIC RADICAL PROSTATECTOMY N/A 12/30/2013   Procedure: ROBOTIC ASSISTED LAPAROSCOPIC RADICAL PROSTATECTOMY WITH INDOCYANINE GREEN DYE;  Surgeon: Alexis Frock, MD;  Location: [facility];  Service: Urology;  Laterality: N/A;   VEIN SURGERY     RT LEG  had surgery 36 yrs ago. 01/25/2021    Social History:  reports  that he has never smoked. He has never used smokeless tobacco. He reports that he does not drink alcohol and does not use drugs.   Allergies  Allergen Reactions   Cephalexin Nausea And Vomiting    patient only took one pill and stated that it made him vomit   Mometasone Other (See Comments)    patient states the cream made sores on his leg   Family history: No reported family history.  Prior to Admission medications   Medication Sig Start Date End Date Taking? Authorizing Provider  acetaminophen (TYLENOL) 500 MG tablet Take  500 mg by mouth daily as needed (pain).    [provider]  hydrALAZINE (APRESOLINE) 50 MG tablet Take 50 mg by mouth 2 (two) times daily. 08/22/21   [provider]  ibuprofen (ADVIL) 200 MG tablet Take 200 mg by mouth daily as needed (pain).    [provider]  Multiple Vitamin (MULTIVITAMIN WITH MINERALS) TABS tablet Take 1 tablet by mouth every morning. Men's Centrum    [provider]  oxyCODONE-acetaminophen (PERCOCET) 5-325 MG tablet Take 1 tablet by mouth every 6 (six) hours as needed for severe pain or moderate pain (post-operativley). 09/18/21 09/18/22  Alexis Frock, MD    Physical Exam: BP (!) 166/66   Pulse 84   Temp 98.2 F (36.8 C) (Oral)   Resp (!) 28   Ht '6\' 2"'$  (1.88 m)   Wt 120.2 kg   SpO2 100%   BMI 34.02 kg/m   General: 76 y.o. year-old male well developed well nourished in no acute distress.  Alert and oriented x3. Cardiovascular: Regular rate and rhythm with no rubs or gallops.  No thyromegaly or JVD noted.  4+ pitting edema lower extremities bilaterally.  Lymphedema. Respiratory: Clear to auscultation with no wheezes or rales. Good inspiratory effort. Abdomen: Soft nontender nondistended with normal bowel sounds x4 quadrants. Muskuloskeletal: No cyanosis or clubbing. Neuro: CN II-XII intact, strength, sensation, reflexes Skin: Chronic venous insufficiency with dermatitis in lower extremities bilaterally. Psychiatry: Judgement and insight appear normal. Mood is appropriate for condition and setting          Labs on Admission:  Basic Metabolic Panel: Recent Labs  Lab 03/11/22 1939  NA 139  K 4.2  CL 110  CO2 22  GLUCOSE 127*  BUN 33*  CREATININE 1.67*  CALCIUM 9.2   Liver Function Tests: No results for input(s): "AST", "ALT", "ALKPHOS", "BILITOT", "PROT", "ALBUMIN" in the last 168 hours. No results for input(s): "LIPASE", "AMYLASE" in the last 168 hours. No results for input(s): "AMMONIA" in the last 168  hours. CBC: Recent Labs  Lab 03/11/22 1939  WBC 6.3  HGB 10.3*  HCT 32.8*  MCV 87.7  PLT 154   Cardiac Enzymes: No results for input(s): "CKTOTAL", "CKMB", "CKMBINDEX", "TROPONINI" in the last 168 hours.  BNP (last 3 results) Recent Labs    03/11/22 1938  BNP 129.2*    ProBNP (last 3 results) No results for input(s): "PROBNP" in the last 8760 hours.  CBG: No results for input(s): "GLUCAP" in the last 168 hours.  Radiological Exams on Admission: DG Chest 2 View  Result Date: 03/11/2022 CLINICAL DATA:  Bilateral lower extremity swelling, chest pain and shortness of breath EXAM: CHEST - 2 VIEW COMPARISON:  10/21/2018 FINDINGS: Frontal and lateral views of the chest demonstrates mild enlargement of the cardiac silhouette, which may be projectional. No acute airspace disease, effusion, or pneumothorax. There are no acute bony abnormalities. IMPRESSION: 1. No acute intrathoracic  process. 2. Mild enlargement of the cardiac silhouette. Electronically Signed   By: Randa Ngo M.D.   On: 03/11/2022 19:34    EKG: I independently viewed the EKG done and my findings are as followed: Sinus rhythm rate of 89.  Nonspecific ST-T changes.  QTc 462.  Assessment/Plan Present on Admission:  Volume overload  Principal Problem:   Volume overload  Volume overload, concern for acute diastolic CHF Bilateral JVD, severe bilateral lower extremity edema. Elevated BNP Follow 2D echo IV diuresing, received a dose of IV Lasix in the ED 80 mg x 1, repeat dose. IV Lasix 40 mg twice daily x2 days.  Replace electrolytes as indicated.  Closely monitor renal function. Start strict I's and O's and daily weight Follows with cardiology Dr. Debbora Dus Consult cardiology in the morning.  Elevated troponin, suspect demand ischemia Denies any anginal symptoms Follow 2D echo Monitor on telemetry Consult cardiology in the morning  Essential hypertension, BP is not at goal, elevated IV diuresis Resume  home oral antihypertensives Closely monitor vital signs  Dysuria Follow UA and urine culture Rocephin empirically.  DC abx if UA and urine culture are not consistent with UTI.  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  CKD 3B Appears to be at his baseline creatinine 1.6 with GFR 42 Avoid nephrotoxic agents, and hypotension Monitor urine output with strict I's and Os Repeat renal function test in the morning.     DVT prophylaxis: Home Coumadin.  Code Status: Full code  Family Communication: Daughter and granddaughter at bedside.  Disposition Plan: Admitted to telemetry unit  Consults called: None  Admission status: Inpatient status.   Status is: Inpatient The patient requires at least 2 midnights for further evaluation and treatment of present condition.   Kayleen Memos MD [facility] Pager [phone]  If 7PM-7AM, please contact night-coverage [email or web address] Password [email or web address]  03/11/2022, 9:22 PM

## 2022-03-11 NOTE — ED Notes (Signed)
Save blue tube in main lab °

## 2022-03-11 NOTE — ED Provider Notes (Signed)
[place] DEPT Provider Note   CSN: [id number] Arrival date & time: 03/11/22  1753     History  Chief Complaint  Patient presents with   Chest Pain   Shortness of Breath   Leg Swelling    [name] is a 76 y.o. male hx of lymphedema, here presenting with chest pain and shortness of breath and leg swelling.  Patient has been having worsening leg swelling for the last month or so.  Patient also has gained about 50 pounds.  Patient started getting short of breath with minimal exertion.  Per the family, she he required 2 people assist just to walk around.  Patient has no known history of heart failure.  The history is provided by the patient.       Home Medications Prior to Admission medications   Medication Sig Start Date End Date Taking? Authorizing Provider  acetaminophen (TYLENOL) 500 MG tablet Take 500 mg by mouth daily as needed (pain).   Yes [provider]  ferrous sulfate 325 (65 FE) MG tablet Take 325 mg by mouth daily with breakfast.   Yes [provider]  hydrALAZINE (APRESOLINE) 50 MG tablet Take 50 mg by mouth 2 (two) times daily. 08/22/21  Yes [provider]  torsemide (DEMADEX) 20 MG tablet Take 20 mg by mouth daily.   Yes [provider]  warfarin (COUMADIN) 7.5 MG tablet Take 7.5 mg by mouth daily. 01/28/22  Yes [provider]  oxyCODONE-acetaminophen (PERCOCET) 5-325 MG tablet Take 1 tablet by mouth every 6 (six) hours as needed for severe pain or moderate pain (post-operativley). Patient not taking: Reported on 03/11/2022 09/18/21 09/18/22  Alexis Frock, MD      Allergies    Cephalexin and Mometasone    Review of Systems   Review of Systems  Respiratory:  Positive for shortness of breath.   Cardiovascular:  Positive for chest pain.  All other systems reviewed and are negative.   Physical Exam Updated Vital Signs BP (!) 166/66   Pulse 84   Temp 98.2 F (36.8 C) (Oral)    Resp (!) 28   Ht '6\' 2"'$  (1.88 m)   Wt 120.2 kg   SpO2 100%   BMI 34.02 kg/m  Physical Exam Vitals and nursing note reviewed.  Constitutional:      Comments: Chronically ill  HENT:     Head: Normocephalic.  Eyes:     Extraocular Movements: Extraocular movements intact.     Pupils: Pupils are equal, round, and reactive to light.  Cardiovascular:     Rate and Rhythm: Normal rate and regular rhythm.     Heart sounds: Normal heart sounds.  Pulmonary:     Effort: Pulmonary effort is normal.     Breath sounds: Normal breath sounds.     Comments: Crackles lung bases. Abdominal:     General: Bowel sounds are normal.     Palpations: Abdomen is soft.  Musculoskeletal:     Cervical back: Normal range of motion and neck supple.     Comments: Lymphedema bilaterally.  Patient has 3+ edema and some venous stasis changes.  Neurological:     General: No focal deficit present.     Mental Status: He is alert.  Psychiatric:        Mood and Affect: Mood normal.        Behavior: Behavior normal.     ED Results / Procedures / Treatments   Labs (all labs ordered are listed, but  only abnormal results are displayed) Labs Reviewed  BASIC METABOLIC PANEL - Abnormal; Notable for the following components:      Result Value   Glucose, Bld 127 (*)    BUN 33 (*)    Creatinine, Ser 1.67 (*)    GFR, Estimated 42 (*)    All other components within normal limits  CBC - Abnormal; Notable for the following components:   RBC 3.74 (*)    Hemoglobin 10.3 (*)    HCT 32.8 (*)    RDW 16.1 (*)    All other components within normal limits  BRAIN NATRIURETIC PEPTIDE - Abnormal; Notable for the following components:   B Natriuretic Peptide 129.2 (*)    All other components within normal limits  TROPONIN I (HIGH SENSITIVITY) - Abnormal; Notable for the following components:   Troponin I (High Sensitivity) 19 (*)    All other components within normal limits  CBC  COMPREHENSIVE METABOLIC PANEL  MAGNESIUM   PHOSPHORUS  TROPONIN I (HIGH SENSITIVITY)    EKG EKG Interpretation  Date/Time:  Tuesday March 11 2022 18:23:40 EDT Ventricular Rate:  89 PR Interval:  128 QRS Duration: 111 QT Interval:  379 QTC Calculation: 462 R Axis:   -39 Text Interpretation: Sinus rhythm Consider right atrial enlargement Abnormal R-wave progression, early transition Left ventricular hypertrophy No significant change since last tracing Confirmed by Wandra Arthurs [phone]) on 03/11/2022 9:03:46 PM  Radiology DG Chest 2 View  Result Date: 03/11/2022 CLINICAL DATA:  Bilateral lower extremity swelling, chest pain and shortness of breath EXAM: CHEST - 2 VIEW COMPARISON:  10/21/2018 FINDINGS: Frontal and lateral views of the chest demonstrates mild enlargement of the cardiac silhouette, which may be projectional. No acute airspace disease, effusion, or pneumothorax. There are no acute bony abnormalities. IMPRESSION: 1. No acute intrathoracic process. 2. Mild enlargement of the cardiac silhouette. Electronically Signed   By: Randa Ngo M.D.   On: 03/11/2022 19:34    Procedures Procedures    Medications Ordered in ED Medications  iohexol (OMNIPAQUE) 350 MG/ML injection 75 mL (has no administration in time range)  enoxaparin (LOVENOX) injection 60 mg (has no administration in time range)  acetaminophen (TYLENOL) tablet 650 mg (has no administration in time range)  prochlorperazine (COMPAZINE) injection 5 mg (has no administration in time range)  polyethylene glycol (MIRALAX / GLYCOLAX) packet 17 g (has no administration in time range)  melatonin tablet 5 mg (has no administration in time range)  furosemide (LASIX) injection 80 mg (80 mg Intravenous Given 03/11/22 2134)    ED Course/ Medical Decision Making/ A&P                           Medical Decision Making [name] is a 76 y.o. male here with shortness of breath and leg swelling.  Patient has worsening shortness of breath and had about 15 pound  weight gain.  Patient has history of lymphedema and appears very grossly volume overloaded.  Plan to get CBC and BMP and BNP and troponin.  Patient will need diuresis  10:17 PM Creatinine is 1.6.  Chest x-ray is clear.  Patient will need to get admitted for diuresis and echo.  Hospitalist to admit   Problems Addressed: Lymphedema: acute illness or injury  Amount and/or Complexity of Data Reviewed Labs: ordered. Decision-making details documented in ED Course. Radiology: ordered and independent interpretation performed. Decision-making details documented in ED Course. ECG/medicine tests: ordered and independent interpretation performed. Decision-making  details documented in ED Course.  Risk Prescription drug management. Decision regarding hospitalization.    Final Clinical Impression(s) / ED Diagnoses Final diagnoses:  None    Rx / DC Orders ED Discharge Orders     None         Drenda Freeze, MD 03/11/22 2218

## 2022-03-11 NOTE — ED Provider Triage Note (Signed)
Emergency Medicine Provider Triage Evaluation Note  [name] , a 76 y.o. male  was evaluated in triage.  Pt complains of 25 pounds weight gain, bilateral leg swelling, right slightly greater than left, chest pain, shortness of breath with exertion.  Patient endorses chest pain, shortness of breath with exertion has been going on for days, but is present at this time, feels like pressure/spasm, it is worse with exertion, denies nausea, vomiting, radiation to the neck or shoulder.  History of lymphedema, no documented history of heart failure.  Review of Systems  Positive: Chest pain, shortness of breath, leg swelling Negative: Fever, chills, NV  Physical Exam  BP (!) 154/67 (BP Location: Right Arm)   Pulse 95   Temp 98.2 F (36.8 C) (Oral)   Resp 16   Ht '6\' 2"'$  (1.88 m)   Wt 120.2 kg   SpO2 100%   BMI 34.02 kg/m  Gen:   Awake, no distress   Resp:  Shallow effort, no significant crackles, ronchi, rales, focal consolidation MSK:   Moves extremities without difficulty  Other:  3+ hard edema of BIL lower extremities, scattered ulcerations, R>L in size.  Medical Decision Making  Medically screening exam initiated at 7:38 PM.  Appropriate orders placed.  [name] was informed that the remainder of the evaluation will be completed by another provider, this initial triage assessment does not replace that evaluation, and the importance of remaining in the ED until their evaluation is complete.  Workup initiated   Anselmo Pickler, [place] 03/11/22 1941

## 2022-03-11 NOTE — ED Notes (Signed)
Pt taken to xray 

## 2022-03-12 ENCOUNTER — Inpatient Hospital Stay (HOSPITAL_COMMUNITY): Payer: Medicare Other

## 2022-03-12 DIAGNOSIS — I10 Essential (primary) hypertension: Secondary | ICD-10-CM

## 2022-03-12 DIAGNOSIS — E669 Obesity, unspecified: Secondary | ICD-10-CM

## 2022-03-12 DIAGNOSIS — I5033 Acute on chronic diastolic (congestive) heart failure: Secondary | ICD-10-CM | POA: Diagnosis not present

## 2022-03-12 LAB — COMPREHENSIVE METABOLIC PANEL
ALT: 15 U/L (ref 0–44)
AST: 30 U/L (ref 15–41)
Albumin: 3.2 g/dL — ABNORMAL LOW (ref 3.5–5.0)
Alkaline Phosphatase: 50 U/L (ref 38–126)
Anion gap: 8 (ref 5–15)
BUN: 36 mg/dL — ABNORMAL HIGH (ref 8–23)
CO2: 25 mmol/L (ref 22–32)
Calcium: 8.8 mg/dL — ABNORMAL LOW (ref 8.9–10.3)
Chloride: 105 mmol/L (ref 98–111)
Creatinine, Ser: 1.68 mg/dL — ABNORMAL HIGH (ref 0.61–1.24)
GFR, Estimated: 42 mL/min — ABNORMAL LOW (ref >60)
Glucose, Bld: 104 mg/dL — ABNORMAL HIGH (ref 70–99)
Potassium: 4.1 mmol/L (ref 3.5–5.1)
Sodium: 138 mmol/L (ref 135–145)
Total Bilirubin: 0.5 mg/dL (ref 0.3–1.2)
Total Protein: 7.1 g/dL (ref 6.5–8.1)

## 2022-03-12 LAB — ECHOCARDIOGRAM COMPLETE
AR max vel: 2.28 cm2
AV Area VTI: 2.48 cm2
AV Area mean vel: 2.08 cm2
AV Mean grad: 9 mmHg
AV Peak grad: 15.4 mmHg
Ao pk vel: 1.96 m/s
Area-P 1/2: 2.69 cm2
Height: 74 in
MV VTI: 2.44 cm2
S' Lateral: 4.1 cm
Weight: 4240 oz

## 2022-03-12 LAB — URINALYSIS, ROUTINE W REFLEX MICROSCOPIC
Bilirubin Urine: NEGATIVE
Glucose, UA: NEGATIVE mg/dL
Hgb urine dipstick: NEGATIVE
Ketones, ur: NEGATIVE mg/dL
Leukocytes,Ua: NEGATIVE
Nitrite: NEGATIVE
Protein, ur: NEGATIVE mg/dL
Specific Gravity, Urine: 1.005 (ref 1.005–1.030)
pH: 7 (ref 5.0–8.0)

## 2022-03-12 LAB — CBC
HCT: 29.5 % — ABNORMAL LOW (ref 39.0–52.0)
Hemoglobin: 9.4 g/dL — ABNORMAL LOW (ref 13.0–17.0)
MCH: 27.3 pg (ref 26.0–34.0)
MCHC: 31.9 g/dL (ref 30.0–36.0)
MCV: 85.8 fL (ref 80.0–100.0)
Platelets: 153 10*3/uL (ref 150–400)
RBC: 3.44 MIL/uL — ABNORMAL LOW (ref 4.22–5.81)
RDW: 16.3 % — ABNORMAL HIGH (ref 11.5–15.5)
WBC: 6.3 10*3/uL (ref 4.0–10.5)
nRBC: 0 % (ref 0.0–0.2)

## 2022-03-12 LAB — PROTIME-INR
INR: 2.7 — ABNORMAL HIGH (ref 0.8–1.2)
Prothrombin Time: 28.6 seconds — ABNORMAL HIGH (ref 11.4–15.2)

## 2022-03-12 LAB — PHOSPHORUS: Phosphorus: 3.7 mg/dL (ref 2.5–4.6)

## 2022-03-12 LAB — MAGNESIUM: Magnesium: 1.9 mg/dL (ref 1.7–2.4)

## 2022-03-12 MED ORDER — FUROSEMIDE 10 MG/ML IJ SOLN
80.0000 mg | Freq: Once | INTRAMUSCULAR | Status: AC
  Administered 2022-03-12: 80 mg via INTRAVENOUS
  Filled 2022-03-12: qty 8

## 2022-03-12 MED ORDER — METHOCARBAMOL 500 MG PO TABS
500.0000 mg | ORAL_TABLET | Freq: Once | ORAL | Status: AC
  Administered 2022-03-12: 500 mg via ORAL
  Filled 2022-03-12: qty 1

## 2022-03-12 MED ORDER — FUROSEMIDE 10 MG/ML IJ SOLN
40.0000 mg | Freq: Two times a day (BID) | INTRAMUSCULAR | Status: DC
  Administered 2022-03-12: 40 mg via INTRAVENOUS
  Filled 2022-03-12: qty 4

## 2022-03-12 MED ORDER — INFLUENZA VAC A&B SA ADJ QUAD 0.5 ML IM PRSY
0.5000 mL | PREFILLED_SYRINGE | INTRAMUSCULAR | Status: AC
  Administered 2022-03-13: 0.5 mL via INTRAMUSCULAR
  Filled 2022-03-12: qty 0.5

## 2022-03-12 MED ORDER — HYDROMORPHONE HCL 1 MG/ML IJ SOLN
0.5000 mg | INTRAMUSCULAR | Status: DC | PRN
  Administered 2022-03-13: 0.5 mg via INTRAVENOUS
  Filled 2022-03-12: qty 0.5

## 2022-03-12 MED ORDER — HYDRALAZINE HCL 25 MG PO TABS
25.0000 mg | ORAL_TABLET | Freq: Two times a day (BID) | ORAL | Status: DC
  Administered 2022-03-12 – 2022-03-14 (×5): 25 mg via ORAL
  Filled 2022-03-12 (×5): qty 1

## 2022-03-12 MED ORDER — FERROUS SULFATE 325 (65 FE) MG PO TABS
325.0000 mg | ORAL_TABLET | Freq: Every day | ORAL | Status: DC
  Administered 2022-03-12 – 2022-03-14 (×3): 325 mg via ORAL
  Filled 2022-03-12 (×3): qty 1

## 2022-03-12 MED ORDER — SENNOSIDES-DOCUSATE SODIUM 8.6-50 MG PO TABS
1.0000 | ORAL_TABLET | Freq: Every day | ORAL | Status: DC
  Filled 2022-03-12 (×2): qty 1

## 2022-03-12 MED ORDER — OXYCODONE HCL 5 MG PO TABS
5.0000 mg | ORAL_TABLET | Freq: Four times a day (QID) | ORAL | Status: DC | PRN
  Administered 2022-03-12 – 2022-03-14 (×2): 5 mg via ORAL
  Filled 2022-03-12 (×2): qty 1

## 2022-03-12 MED ORDER — WARFARIN - PHARMACIST DOSING INPATIENT: Freq: Every day | Status: DC

## 2022-03-12 MED ORDER — WARFARIN SODIUM 5 MG PO TABS
7.5000 mg | ORAL_TABLET | Freq: Once | ORAL | Status: AC
  Administered 2022-03-12: 7.5 mg via ORAL
  Filled 2022-03-12 (×2): qty 1

## 2022-03-12 MED ORDER — SODIUM CHLORIDE 0.9 % IV SOLN
2.0000 g | INTRAVENOUS | Status: DC
  Administered 2022-03-12 – 2022-03-14 (×3): 2 g via INTRAVENOUS
  Filled 2022-03-12 (×3): qty 20

## 2022-03-12 NOTE — Evaluation (Signed)
Occupational Therapy Evaluation Patient Details Name: [name] MRN: [id number] DOB: 01-14-1946 Today's Date: 03/12/2022   History of Present Illness [name] is a 76 y.o. male with medical history significant for hydrocele, followed by urology, left inguinal hernia status post laparoscopic left inguinal hernia repair, right lower extremity DVT on Coumadin, hypertension, CKD 3, prostate cancer, lymphedema, who presented to [facility] ED with complaints of worsening bilateral lower extremity edema and significant weight gain 25 to 50 pounds in the past month.  Associated with exertional dyspnea. Work-up in the ED revealed volume overload, Bilateral JVD, concern for acute CHF.   Clinical Impression   Patient is a 76 year old male who was admitted for above. Patient was noted to have increased edema in BLE impacting participation in ADLs.  Patient needed increased A for bed mobility for BLE movement and LB ADLs. Patient would benefit from continued skilled OT services to work on functional activity tolerance, AE use for LB Adls, and standing balance during Acute stay. Anticipate patient will be able to progress not not need skilled OT services in next level of care during hospitalization. Patient would continue to benefit from skilled OT services at this time while admitted to address noted deficits in order to improve overall safety and independence in ADLs.       Recommendations for follow up therapy are one component of a multi-disciplinary discharge planning process, led by the attending physician.  Recommendations may be updated based on patient status, additional functional criteria and insurance authorization.   Follow Up Recommendations  No OT follow up    Assistance Recommended at Discharge Frequent or constant Supervision/Assistance  Patient can return home with the following A little help with bathing/dressing/bathroom;Assistance with cooking/housework;Assist for transportation;Help  with stairs or ramp for entrance    Functional Status Assessment  Patient has had a recent decline in their functional status and demonstrates the ability to make significant improvements in function in a reasonable and predictable amount of time.  Equipment Recommendations       Recommendations for Other Services       Precautions / Restrictions Precautions Precaution Comments: lymphedema BLE      Mobility Bed Mobility Overal bed mobility: Needs Assistance Bed Mobility: Supine to Sit, Sit to Supine     Supine to sit: Supervision Sit to supine: Min assist   General bed mobility comments: assist legs back onto bed.    Transfers Overall transfer level: Needs assistance Equipment used: Rolling walker (2 wheels) Transfers: Sit to/from Stand Sit to Stand: Supervision, From elevated surface                  Balance Overall balance assessment: Mild deficits observed, not formally tested                                         ADL either performed or assessed with clinical judgement   ADL Overall ADL's : Needs assistance/impaired Eating/Feeding: Set up;Sitting Eating/Feeding Details (indicate cue type and reason): in bed Grooming: Set up;Sitting   Upper Body Bathing: Set up;Sitting   Lower Body Bathing: Maximal assistance;Sitting/lateral leans   Upper Body Dressing : Set up;Sitting   Lower Body Dressing: Maximal assistance;Bed level   Toilet Transfer: Minimal assistance;Ambulation;Rolling walker (2 wheels);Independent   Toileting- Clothing Manipulation and Hygiene: Moderate assistance;Sit to/from stand  Vision         Perception     Praxis      Pertinent Vitals/Pain Pain Assessment Pain Assessment: No/denies pain     Hand Dominance     Extremity/Trunk Assessment Upper Extremity Assessment Upper Extremity Assessment: Overall WFL for tasks assessed   Lower Extremity Assessment Lower Extremity Assessment:  Defer to PT evaluation RLE Deficits / Details: lymphedema noted bilaterally LLE Deficits / Details: similar to right   Cervical / Trunk Assessment Cervical / Trunk Assessment: Normal   Communication Communication Communication: No difficulties   Cognition                                             General Comments       Exercises     Shoulder Instructions      Home Living Family/patient expects to be discharged to:: Private residence Living Arrangements: Spouse/significant other Available Help at Discharge: Family Type of Home: House Home Access: Stairs to enter [employer] of Steps: 12 through basement. 1 step for deck   Home Layout: Two level     Bathroom Shower/Tub: Walk-in shower         Home Equipment: [place] - single point   Additional Comments: hurry cane      Prior Functioning/Environment Prior Level of Function : Independent/Modified Independent             Mobility Comments: drives, shops          OT Problem List: Increased edema;Decreased activity tolerance;Decreased knowledge of use of DME or AE;Decreased safety awareness;Decreased knowledge of precautions      OT Treatment/Interventions: Self-care/ADL training;DME and/or AE instruction;Therapeutic activities;Balance training;Energy conservation;Patient/family education    OT Goals(Current goals can be found in the care plan section) Acute Rehab OT Goals Patient Stated Goal: to get back home OT Goal Formulation: With patient Time For Goal Achievement: 03/26/22 Potential to Achieve Goals: Fair  OT Frequency: Min 2X/week    Co-evaluation PT/OT/SLP Co-Evaluation/Treatment: Yes Reason for Co-Treatment: For patient/therapist safety;To address functional/ADL transfers PT goals addressed during session: Mobility/safety with mobility OT goals addressed during session: ADL's and self-care      AM-PAC OT "6 Clicks" Daily Activity     Outcome Measure Help from  another person eating meals?: None Help from another person taking care of personal grooming?: A Little Help from another person toileting, which includes using toliet, bedpan, or urinal?: A Little Help from another person bathing (including washing, rinsing, drying)?: A Lot Help from another person to put on and taking off regular upper body clothing?: A Little Help from another person to put on and taking off regular lower body clothing?: A Lot 6 Click Score: 17   End of Session Equipment Utilized During Treatment: Gait belt;Rolling walker (2 wheels)  Activity Tolerance: Patient tolerated treatment well Patient left: in bed;with call bell/phone within reach  OT Visit Diagnosis: Unsteadiness on feet (R26.81);Other abnormalities of gait and mobility (R26.89)                Time: 3762-8315 OT Time Calculation (min): 14 min Charges:  OT General Charges $OT Visit: 1 Visit OT Evaluation $OT Eval Low Complexity: 1 Low  Chaska Hagger OTR/L, MS Acute Rehabilitation Department Office# [phone]   Feliz Beam Kynan Peasley 03/12/2022, 1:15 PM

## 2022-03-12 NOTE — ED Notes (Signed)
Patient complaining of muscle spasms in his legs, hospitalist made aware. Awaiting orders.

## 2022-03-12 NOTE — ED Notes (Signed)
Patient complaining of burning with urination, condom cath replaced and pt given partial bed bath. Hospitalist made aware.

## 2022-03-12 NOTE — Consult Note (Signed)
WOC Nurse Consult Note: Lymphedema, fluid volume overload. History vein stripping.  Wears removable compression at home which he can don/doff he indicates.  Does not have these here with him today.  Difficulty noted ambulating with walker in hall back from bathroom.  Reason for Consult: Chronic edema lower legs with acute fluid volume overload.  Wound type: Edema lower legs Pressure Injury POA: NA Measurement: Left anterior and medial aspects legs with intact scarring from previous lesions.  Lichenification present to bilateral lower legs near malleolus.  Wound bed: intact scarring Drainage (amount, consistency, odor) none noted Periwound: generalized edema in lower legs, starting at knees.  Dressing procedure/placement/frequency: Wll implement twice weekly Unna boots with no additional topical care needed.  Ortho tech will apply per orders.  Will not follow at this time.  Please re-consult if needed.  [name] Moras MSN, RN, FNP-BC CWON Wound, Ostomy, Continence Nurse Pager [phone]

## 2022-03-12 NOTE — Evaluation (Signed)
Physical Therapy Evaluation Patient Details Name: [name] MRN: [id number] DOB: 1946-04-03 Today's Date: 03/12/2022  History of Present Illness  [name] is a 76 y.o. male with medical history significant for hydrocele, followed by urology, left inguinal hernia status post laparoscopic left inguinal hernia repair, right lower extremity DVT on Coumadin, hypertension, CKD 3, prostate cancer, lymphedema, who presented to [facility] ED with complaints of worsening bilateral lower extremity edema and significant weight gain 25 to 50 pounds in the past month.  Associated with exertional dyspnea. Work-up in the ED revealed volume overload, Bilateral JVD, concern for acute CHF.  Clinical Impression  The patient admitted for above medical problems.  Patient  eager to get out of bed and  feels relief to have opportunity to ambulate.  Patient  should progress to return home when medically ready.  Pt admitted with above diagnosis.  Pt currently with functional limitations due to the deficits listed below (see PT Problem List). Pt will benefit from skilled PT to increase their independence and safety with mobility to allow discharge to the venue listed below.     Patient reports having  been followed by OP for lymphedema      Recommendations for follow up therapy are one component of a multi-disciplinary discharge planning process, led by the attending physician.  Recommendations may be updated based on patient status, additional functional criteria and insurance authorization.  Follow Up Recommendations No PT follow up (lymphedema clinic)      Assistance Recommended at Discharge Set up Supervision/Assistance  Patient can return home with the following  Assist for transportation;Help with stairs or ramp for entrance;Assistance with cooking/housework    Equipment Recommendations Rolling walker (2 wheels)  Recommendations for Other Services       Functional Status Assessment Patient has had a  recent decline in their functional status and demonstrates the ability to make significant improvements in function in a reasonable and predictable amount of time.     Precautions / Restrictions Precautions Precaution Comments: lymphedema      Mobility  Bed Mobility Overal bed mobility: Needs Assistance Bed Mobility: Supine to Sit, Sit to Supine     Supine to sit: Supervision Sit to supine: Min assist   General bed mobility comments: assist legs back onto bed.    Transfers Overall transfer level: Needs assistance Equipment used: Rolling walker (2 wheels) Transfers: Sit to/from Stand Sit to Stand: Supervision, From elevated surface                Ambulation/Gait Ambulation/Gait assistance: Supervision Gait Distance (Feet): 20 Feet Assistive device: Rolling walker (2 wheels) Gait Pattern/deviations: Step-to pattern, Step-through pattern Gait velocity: decr     General Gait Details: gait steady and slow.  Stairs            Wheelchair Mobility    Modified Rankin (Stroke Patients Only)       Balance Overall balance assessment: Mild deficits observed, not formally tested                                           Pertinent Vitals/Pain Pain Assessment Pain Assessment: No/denies pain    Home Living Family/patient expects to be discharged to:: Private residence Living Arrangements: Spouse/significant other Available Help at Discharge: Family Type of Home: House Home Access: Stairs to enter   [employer] of Steps: 12 through basement. 1 step for deck  Home Layout: Two level Home Equipment: Cane - single point Additional Comments: hurry cane    Prior Function Prior Level of Function : Independent/Modified Independent             Mobility Comments: drives, shops       Hand Dominance        Extremity/Trunk Assessment        Lower Extremity Assessment Lower Extremity Assessment: RLE deficits/detail;LLE  deficits/detail RLE Deficits / Details: lymphedema noted, needs some support to lift from bed and back onto bed LLE Deficits / Details: similar to right    Cervical / Trunk Assessment Cervical / Trunk Assessment: Normal  Communication   Communication: No difficulties  Cognition Arousal/Alertness: Awake/alert Behavior During Therapy: WFL for tasks assessed/performed Overall Cognitive Status: Within Functional Limits for tasks assessed                                          General Comments      Exercises     Assessment/Plan    PT Assessment Patient needs continued PT services  PT Problem List Decreased strength;Decreased activity tolerance;Decreased mobility;Decreased skin integrity       PT Treatment Interventions DME instruction;Gait training;Functional mobility training;Therapeutic activities;Therapeutic exercise;Patient/family education    PT Goals (Current goals can be found in the Care Plan section)  Acute Rehab PT Goals Patient Stated Goal: to get up and walk more PT Goal Formulation: With patient Time For Goal Achievement: 03/26/22 Potential to Achieve Goals: Good    Frequency Min 3X/week     Co-evaluation               AM-PAC PT "6 Clicks" Mobility  Outcome Measure Help needed turning from your back to your side while in a flat bed without using bedrails?: A Little Help needed moving from lying on your back to sitting on the side of a flat bed without using bedrails?: A Little Help needed moving to and from a bed to a chair (including a wheelchair)?: A Little Help needed standing up from a chair using your arms (e.g., wheelchair or bedside chair)?: A Little Help needed to walk in hospital room?: A Little Help needed climbing 3-5 steps with a railing? : A Lot 6 Click Score: 17    End of Session   Activity Tolerance: Patient tolerated treatment well Patient left: in bed;with call bell/phone within reach Nurse Communication:  Mobility status PT Visit Diagnosis: Unsteadiness on feet (R26.81);Difficulty in walking, not elsewhere classified (R26.2)    Time: 4196-2229 PT Time Calculation (min) (ACUTE ONLY): 24 min   Charges:   PT Evaluation $PT Eval Low Complexity: 1 Low          [place] Office [phone] Weekend [phone]   Claretha Cooper 03/12/2022, 10:08 AM

## 2022-03-12 NOTE — Progress Notes (Signed)
*  PRELIMINARY RESULTS* Echocardiogram 2D Echocardiogram has been performed.  Elpidio Anis 03/12/2022, 11:26 AM

## 2022-03-12 NOTE — Progress Notes (Signed)
PROGRESS NOTE    [name]  WRU:[id number] DOB: June 13, 1945 DOA: 03/11/2022 PCP: Dixie Dials, MD   Brief Narrative: No notes on file   Assessment and Plan:  Acute on chronic diastolic heart failure JVD, bilateral edema and elevated BNP. Patient started on Lasix 80 mg IV with significant diuresis. -Decrease to Lasix 40 mg IV BID -Daily weights and strict in/out -Daily BMP  Elevated troponin Presumed secondary to demand ischemia. No chest pain or dyspnea.  Primary hypertension -Continue Hydralazine  Dysuria Urinalysis does not suggest infection  CKD stage IIIb Stable.  History of DVT -Continue Coumadin  Chronic venous stasis dermatitis Chronic lymphedema Wound care consulted  Right knee pain Right knee x-ray unremarkable for fracture.  Obesity Estimated body mass index is 34.02 kg/m as calculated from the following:   Height as of this encounter: '6\' 2"'$  (1.88 m).   Weight as of this encounter: 120.2 kg.  DVT prophylaxis: Coumadin Code Status:   Code Status: Full Code Family Communication: None at bedside Disposition Plan: Discharge home likely in 1-3 days once transitioned to oral lasix and pending PT/OT recommendations   Consultants:  None  Procedures:  Transthoracic Echocardiogram  Antimicrobials: None    Subjective: Patient is happy about improvement in leg swelling. No other concerns from overnight.  Objective: BP (!) 143/67 (BP Location: Right Arm)   Pulse 60   Temp (!) 97.5 F (36.4 C) (Oral)   Resp 16   Ht '6\' 2"'$  (1.88 m)   Wt 120.2 kg   SpO2 96%   BMI 34.02 kg/m   Examination:  General exam: Appears calm and comfortable Respiratory system: Diffuse rales/rhonchi. Respiratory effort normal. Cardiovascular system: S1 & S2 heard, RRR. No murmurs, rubs, gallops or clicks. Gastrointestinal system: Abdomen is nondistended, soft and nontender. No organomegaly or masses felt. Normal bowel sounds heard. Central nervous system:  Alert and oriented. No focal neurological deficits. Musculoskeletal: Massive bilateral lower extremity edema with mild pitting. No calf tenderness Psychiatry: Judgement and insight appear normal. Mood & affect appropriate.    Data Reviewed: I have personally reviewed following labs and imaging studies  CBC Lab Results  Component Value Date   WBC 6.3 03/12/2022   RBC 3.44 (L) 03/12/2022   HGB 9.4 (L) 03/12/2022   HCT 29.5 (L) 03/12/2022   MCV 85.8 03/12/2022   MCH 27.3 03/12/2022   PLT 153 03/12/2022   MCHC 31.9 03/12/2022   RDW 16.3 (H) 03/12/2022   LYMPHSABS 0.8 07/20/2019   MONOABS 0.7 07/20/2019   EOSABS 0.1 07/20/2019   BASOSABS 0.0 91/47/8295     Last metabolic panel Lab Results  Component Value Date   NA 138 03/12/2022   K 4.1 03/12/2022   CL 105 03/12/2022   CO2 25 03/12/2022   BUN 36 (H) 03/12/2022   CREATININE 1.68 (H) 03/12/2022   GLUCOSE 104 (H) 03/12/2022   GFRNONAA 42 (L) 03/12/2022   GFRAA 54 (L) 07/27/2019   CALCIUM 8.8 (L) 03/12/2022   PHOS 3.7 03/12/2022   PROT 7.1 03/12/2022   ALBUMIN 3.2 (L) 03/12/2022   BILITOT 0.5 03/12/2022   ALKPHOS 50 03/12/2022   AST 30 03/12/2022   ALT 15 03/12/2022   ANIONGAP 8 03/12/2022    GFR: Estimated Creatinine Clearance: 50.7 mL/min (A) (by C-G formula based on SCr of 1.68 mg/dL (H)).  No results found for this or any previous visit (from the past 240 hour(s)).    Radiology Studies: DG Knee Right Port  Result Date: 03/12/2022 CLINICAL DATA:  Knee pain, no known injury, initial encounter EXAM: PORTABLE RIGHT KNEE - 4 VIEW COMPARISON:  None Available. FINDINGS: No acute fracture or dislocation is noted. Generalized soft tissue swelling and edema is noted. Mild degenerative changes of the medial joint space are seen. IMPRESSION: No acute fracture is noted.  Generalized soft tissue edema is seen. Electronically Signed   By: Inez Catalina M.D.   On: 03/12/2022 01:01   CT Angio Chest PE W and/or Wo  Contrast  Result Date: 03/11/2022 CLINICAL DATA:  Pulmonary embolus suspected with high probability. Increased shortness of breath and chest pain. Excess fluid. EXAM: CT ANGIOGRAPHY CHEST WITH CONTRAST TECHNIQUE: Multidetector CT imaging of the chest was performed using the standard protocol during bolus administration of intravenous contrast. Multiplanar CT image reconstructions and MIPs were obtained to evaluate the vascular anatomy. RADIATION DOSE REDUCTION: This exam was performed according to the departmental dose-optimization program which includes automated exposure control, adjustment of the mA and/or kV according to patient size and/or use of iterative reconstruction technique. CONTRAST:  81m OMNIPAQUE IOHEXOL 350 MG/ML SOLN COMPARISON:  Chest radiograph 03/11/2022 FINDINGS: Cardiovascular: Suboptimal contrast bolus and motion artifact limit evaluation of the pulmonary arteries. As visualized, no large central filling defects are demonstrated suggesting no evidence of central pulmonary embolus. Mild cardiac enlargement. No pericardial effusions. Normal caliber thoracic aorta. No aortic dissection. Calcification of the aorta and coronary arteries. Mediastinum/Nodes: No enlarged mediastinal, hilar, or axillary lymph nodes. Thyroid gland, trachea, and esophagus demonstrate no significant findings. Lungs/Pleura: Motion artifact limits evaluation. Lungs appear clear as visualized. No pleural effusions. No pneumothorax. Upper Abdomen: No acute abnormality. Musculoskeletal: Degenerative changes.  No acute bony abnormalities. Review of the MIP images confirms the above findings. IMPRESSION: 1. The examination is technically limited due to suboptimal contrast bolus and motion artifact. As visualized. No large central pulmonary emboli are apparent. 2. Mild cardiac enlargement. 3. Aortic atherosclerosis. 4. Lungs are grossly clear. Electronically Signed   By: WLucienne CapersM.D.   On: 03/11/2022 22:11   DG  Chest 2 View  Result Date: 03/11/2022 CLINICAL DATA:  Bilateral lower extremity swelling, chest pain and shortness of breath EXAM: CHEST - 2 VIEW COMPARISON:  10/21/2018 FINDINGS: Frontal and lateral views of the chest demonstrates mild enlargement of the cardiac silhouette, which may be projectional. No acute airspace disease, effusion, or pneumothorax. There are no acute bony abnormalities. IMPRESSION: 1. No acute intrathoracic process. 2. Mild enlargement of the cardiac silhouette. Electronically Signed   By: MRanda NgoM.D.   On: 03/11/2022 19:34      LOS: 1 day    RCordelia Poche MD [facility] 03/12/2022, 9:02 AM   If 7PM-7AM, please contact night-coverage [email or web address]

## 2022-03-12 NOTE — Progress Notes (Addendum)
ANTICOAGULATION CONSULT NOTE - Initial Consult  Pharmacy Consult for Warfarin Indication: hx DVT  Allergies  Allergen Reactions   Cephalexin Nausea And Vomiting    patient only took one pill and stated that it made him vomit   Mometasone Other (See Comments)    patient states the cream made sores on his leg    Patient Measurements: Height: '6\' 2"'$  (188 cm) Weight: 120.2 kg (265 lb) IBW/kg (Calculated) : 82.2  Vital Signs: Temp: 98 F (36.7 C) (10/25 0100) Temp Source: Oral (10/24 1820) BP: 177/70 (10/25 0045) Pulse Rate: 89 (10/25 0045)  Labs: Recent Labs    03/11/22 1939 03/11/22 2143  HGB 10.3*  --   HCT 32.8*  --   PLT 154  --   CREATININE 1.67*  --   TROPONINIHS 19* 20*    Estimated Creatinine Clearance: 51 mL/min (A) (by C-G formula based on SCr of 1.67 mg/dL (H)).   Medical History: Past Medical History:  Diagnosis Date   Anemia    Arthritis    CKD STAGE 3A    PER DR. Vikki Ports CONNOR ON 01/10/2021 OFFICE VISIT   Difficulty sleeping    DVT (deep venous thrombosis) (HCC)    rt leg 3 yrs ago   Hypertension    Leg cramps    Lymphedema    Prostate cancer (HCC)    Swelling of lower extremity    RT LEG   Varicose veins    both legs when he was 76 yrs old. 01/25/2021   Wears glasses    wears glasses for driving.  01/25/2021    Medications:  Warfarin 7.'5mg'$  po daily- last dose 10/24 @ 1500  Assessment: 76 yo M on chronic warfarin for hx DVT.  INR 2.7- therapeutic on admission.  Admit with HF exacerbation/volume overload. Also on broad spectrum antibiotics (Rocephin) for possible UTI which could interact to increase INR.   CBC: Hg 10.3- low, pltc 154-borderline low, however these appear to be patient's baseline.  No bleeding noted.   Goal of Therapy:  INR 2-3   Plan:  D/C Lovenox since INR therapeutic Coumadin 7.'5mg'$  po x1 today Daily INR  Netta Cedars PharmD 03/12/2022,1:58 AM

## 2022-03-13 DIAGNOSIS — N1832 Chronic kidney disease, stage 3b: Secondary | ICD-10-CM

## 2022-03-13 DIAGNOSIS — I10 Essential (primary) hypertension: Secondary | ICD-10-CM

## 2022-03-13 DIAGNOSIS — I5033 Acute on chronic diastolic (congestive) heart failure: Secondary | ICD-10-CM

## 2022-03-13 DIAGNOSIS — I340 Nonrheumatic mitral (valve) insufficiency: Secondary | ICD-10-CM

## 2022-03-13 DIAGNOSIS — Z86718 Personal history of other venous thrombosis and embolism: Secondary | ICD-10-CM

## 2022-03-13 DIAGNOSIS — R300 Dysuria: Secondary | ICD-10-CM

## 2022-03-13 DIAGNOSIS — I071 Rheumatic tricuspid insufficiency: Secondary | ICD-10-CM

## 2022-03-13 DIAGNOSIS — I2489 Other forms of acute ischemic heart disease: Secondary | ICD-10-CM

## 2022-03-13 LAB — URINE CULTURE: Culture: <10000 — AB

## 2022-03-13 LAB — BASIC METABOLIC PANEL
Anion gap: 6 (ref 5–15)
BUN: 40 mg/dL — ABNORMAL HIGH (ref 8–23)
CO2: 27 mmol/L (ref 22–32)
Calcium: 8.6 mg/dL — ABNORMAL LOW (ref 8.9–10.3)
Chloride: 106 mmol/L (ref 98–111)
Creatinine, Ser: 1.79 mg/dL — ABNORMAL HIGH (ref 0.61–1.24)
GFR, Estimated: 39 mL/min — ABNORMAL LOW (ref >60)
Glucose, Bld: 133 mg/dL — ABNORMAL HIGH (ref 70–99)
Potassium: 3.9 mmol/L (ref 3.5–5.1)
Sodium: 139 mmol/L (ref 135–145)

## 2022-03-13 LAB — PROTIME-INR
INR: 3.1 — ABNORMAL HIGH (ref 0.8–1.2)
Prothrombin Time: 31.3 seconds — ABNORMAL HIGH (ref 11.4–15.2)

## 2022-03-13 MED ORDER — WARFARIN SODIUM 6 MG PO TABS
6.0000 mg | ORAL_TABLET | Freq: Once | ORAL | Status: AC
  Administered 2022-03-13: 6 mg via ORAL
  Filled 2022-03-13: qty 1

## 2022-03-13 MED ORDER — ORAL CARE MOUTH RINSE: 15.0000 mL | OROMUCOSAL | Status: DC | PRN

## 2022-03-13 MED ORDER — TORSEMIDE 20 MG PO TABS
20.0000 mg | ORAL_TABLET | Freq: Every day | ORAL | Status: DC
  Administered 2022-03-14: 20 mg via ORAL
  Filled 2022-03-13: qty 1

## 2022-03-13 NOTE — Discharge Summary (Addendum)
Physician Discharge Summary   Patient: [name] MRN: [id number] DOB: Nov 10, 1945  Admit date:     03/11/2022  Discharge date: 03/14/22  Discharge Physician: Cordelia Poche, MD   PCP: Dixie Dials, MD   Recommendations at discharge:  PCP/Cardiology follow-up BMP recheck in 3-5 days Continue torsemide as prescribed Wound clinic for lymphedema management  Discharge Diagnoses: Principal Problem:   Acute on chronic diastolic CHF (congestive heart failure) ([place]) Active Problems:   Personal history of thromboembolic disease   Volume overload   Demand ischemia   Primary hypertension   Dysuria   Chronic kidney disease, stage 3b (HCC)   Tricuspid valve regurgitation   Mitral valve regurgitation  Resolved Problems:   * No resolved hospital problems. *  Hospital Course: [name] is a 76 y.o. male with a history of hydrocele, prostate cancer status post prostatectomy, inguinal hernia s/p repair, right lower extremity DVT on Coumadin, hypertension, CKD stage IIIb, lymphedema.  Patient presented secondary to bilateral leg swelling with concern for fluid overload/heart failure.  Patient started on Lasix IV diuresis with good output.  Significant reduction in edema with treatment.  Echo cardiogram without left ventricular EF reduction and persistent grade 1 diastolic heart failure.  Weight on discharge is 114.7 kg  Assessment and Plan:  Acute on chronic diastolic heart failure JVD, bilateral edema and elevated BNP. Patient started on Lasix 80 mg IV with significant diuresis. Weight of 120.2 kg on admission, down to 115.3 kg on discharge. Total documented net -11 L by day of discharge. Transthoracic Echocardiogram with preserved LVEF of 55-60% with grade 1 diastolic dysfunction. Patient with associated mild elevation in creatinine. Patient to transition back to home torsemide on day after discharge.   Demand ischemia Minimally elevated troponin of 19 with negative delta of 20. No  chest pain or dyspnea. Transthoracic Echocardiogram without wall motion abnormalities.   Primary hypertension Continue Hydralazine.  Orthostatic hypotension Likely secondary to over-diuresis. Resolved prior to discharge.   Dysuria Urinalysis does not suggest infection   CKD stage IIIb Baseline of 1.6. Mild increase to 1.79 on day before discharge secondary to IV Lasix diuresis. Creatinine down to 1.59 on discharge and is at baseline.   History of DVT Continue Coumadin.   Chronic venous stasis dermatitis Chronic lymphedema Wound care consulted. Patient to follow-up as an outpatient.   Right knee pain Right knee x-ray unremarkable for fracture. No effusion noted.  Tricuspid valve regurgitation Mitral valve regurgitation Follow-up with cardiology.  Obesity Estimated body mass index is 32.64 kg/m as calculated from the following:   Height as of this encounter: '6\' 2"'$  (1.88 m).   Weight as of this encounter: 115.3 kg.   Consultants: None Procedures performed: Transthoracic Echocardiogram  Disposition: Home Diet recommendation: Heart healthy   DISCHARGE MEDICATION: Allergies as of 03/13/2022       Reactions   Cephalexin Nausea And Vomiting   patient only took one pill and stated that it made him vomit   Mometasone Other (See Comments)   patient states the cream made sores on his leg        Medication List     STOP taking these medications    oxyCODONE-acetaminophen 5-325 MG tablet Commonly known as: Percocet       TAKE these medications    acetaminophen 500 MG tablet Commonly known as: TYLENOL Take 500 mg by mouth daily as needed (pain).   ferrous sulfate 325 (65 FE) MG tablet Take 325 mg by mouth daily with breakfast.  hydrALAZINE 50 MG tablet Commonly known as: APRESOLINE Take 50 mg by mouth 2 (two) times daily.   torsemide 20 MG tablet Commonly known as: DEMADEX Take 20 mg by mouth daily.   warfarin 7.5 MG tablet Commonly known as:  COUMADIN Take 7.5 mg by mouth daily.        Follow-up Information     Dixie Dials, MD. Schedule an appointment as soon as possible for a visit in 1 week(s).   Specialty: Cardiology Why: For hospital follow-up Contact information: [place] [address] [phone]                Discharge Exam: BP (!) 163/61 (BP Location: Right Arm)   Pulse 88   Temp 98.7 F (37.1 C) (Oral)   Resp 18   Ht '6\' 2"'$  (1.88 m)   Wt 115.3 kg   SpO2 99%   BMI 32.64 kg/m   General exam: Appears calm and comfortable Respiratory system: Clear to auscultation. Respiratory effort normal. Cardiovascular system: S1 & S2 heard, RRR. Gastrointestinal system: Abdomen is nondistended, soft and nontender. No organomegaly or masses felt. Normal bowel sounds heard. Central nervous system: Alert and oriented. No focal neurological deficits. Musculoskeletal: Massive bilateral lower extremity pitting edema. No calf tenderness Psychiatry: Judgement and insight appear normal. Mood & affect appropriate.   Condition at discharge: stable  The results of significant diagnostics from this hospitalization (including imaging, microbiology, ancillary and laboratory) are listed below for reference.   Imaging Studies: ECHOCARDIOGRAM COMPLETE  Result Date: 03/12/2022    ECHOCARDIOGRAM REPORT   Patient Name:   [name] Date of Exam: 03/12/2022 Medical Rec #:  [id number]      Height:       74.0 in Accession #:    [id number]     Weight:       265.0 lb Date of Birth:  August 30, 1945     BSA:          2.450 m Patient Age:    57 years       BP:           150/59 mmHg Patient Gender: M              HR:           80 bpm. Exam Location:  Inpatient Procedure: 2D Echo, Cardiac Doppler and Color Doppler Indications:    CHF  History:        Patient has no prior history of Echocardiogram examinations.                 CHF. Volume overload.  Sonographer:    Wenda Low Referring Phys: [id number] [place]  1. Left ventricular ejection fraction, by estimation, is 55 to 60%. The left ventricle has normal function. The left ventricle has no regional wall motion abnormalities. There is moderate concentric left ventricular hypertrophy. Left ventricular diastolic parameters are consistent with Grade I diastolic dysfunction (impaired relaxation). Elevated left atrial pressure.  2. Right ventricular systolic function is normal. The right ventricular size is mildly enlarged. There is mildly elevated pulmonary artery systolic pressure. The estimated right ventricular systolic pressure is 54.5 mmHg.  3. Left atrial size was moderately dilated.  4. Right atrial size was moderately dilated.  5. The mitral valve is normal in structure. Trivial mitral valve regurgitation.  6. Tricuspid valve regurgitation is mild to moderate.  7. The aortic valve is tricuspid. There is mild calcification of the aortic valve. Aortic  valve regurgitation is not visualized. Aortic valve sclerosis/calcification is present, without any evidence of aortic stenosis. FINDINGS  Left Ventricle: Left ventricular ejection fraction, by estimation, is 55 to 60%. The left ventricle has normal function. The left ventricle has no regional wall motion abnormalities. The left ventricular internal cavity size was normal in size. There is  moderate concentric left ventricular hypertrophy. Left ventricular diastolic parameters are consistent with Grade I diastolic dysfunction (impaired relaxation). Elevated left atrial pressure. Right Ventricle: The right ventricular size is mildly enlarged. No increase in right ventricular wall thickness. Right ventricular systolic function is normal. There is mildly elevated pulmonary artery systolic pressure. The tricuspid regurgitant velocity is 2.85 m/s, and with an assumed right atrial pressure of 8 mmHg, the estimated right ventricular systolic pressure is 03.5 mmHg. Left Atrium: Left atrial size was moderately  dilated. Right Atrium: Right atrial size was moderately dilated. Pericardium: There is no evidence of pericardial effusion. Mitral Valve: The mitral valve is normal in structure. Trivial mitral valve regurgitation, with centrally-directed jet. MV peak gradient, 8.4 mmHg. The mean mitral valve gradient is 2.0 mmHg. Tricuspid Valve: The tricuspid valve is normal in structure. Tricuspid valve regurgitation is mild to moderate. Aortic Valve: The aortic valve is tricuspid. There is mild calcification of the aortic valve. Aortic valve regurgitation is not visualized. Aortic valve sclerosis/calcification is present, without any evidence of aortic stenosis. Aortic valve mean gradient measures 9.0 mmHg. Aortic valve peak gradient measures 15.4 mmHg. Aortic valve area, by VTI measures 2.48 cm. Pulmonic Valve: The pulmonic valve was grossly normal. Pulmonic valve regurgitation is not visualized. Aorta: The aortic root and ascending aorta are structurally normal, with no evidence of dilitation. IAS/Shunts: No atrial level shunt detected by color flow Doppler.  LEFT VENTRICLE PLAX 2D LVIDd:         5.20 cm   Diastology LVIDs:         4.10 cm   LV e' medial:    6.09 cm/s LV PW:         1.50 cm   LV E/e' medial:  14.6 LV IVS:        1.60 cm   LV e' lateral:   7.72 cm/s LVOT diam:     2.10 cm   LV E/e' lateral: 11.5 LV SV:         104 LV SV Index:   43 LVOT Area:     3.46 cm  RIGHT VENTRICLE RV Basal diam:  4.15 cm RV Mid diam:    3.90 cm RV S prime:     14.90 cm/s TAPSE (M-mode): 3.2 cm LEFT ATRIUM            Index        RIGHT ATRIUM           Index LA diam:      4.50 cm  1.84 cm/m   RA Area:     26.90 cm LA Vol (A2C): 98.1 ml  40.05 ml/m  RA Volume:   97.00 ml  39.60 ml/m LA Vol (A4C): 117.0 ml 47.76 ml/m  AORTIC VALVE                     PULMONIC VALVE AV Area (Vmax):    2.28 cm      PV Vmax:       1.36 m/s AV Area (Vmean):   2.08 cm      PV Peak grad:  7.4 mmHg AV Area (VTI):  2.48 cm AV Vmax:           196.00  cm/s AV Vmean:          146.000 cm/s AV VTI:            0.420 m AV Peak Grad:      15.4 mmHg AV Mean Grad:      9.0 mmHg LVOT Vmax:         129.00 cm/s LVOT Vmean:        87.600 cm/s LVOT VTI:          0.301 m LVOT/AV VTI ratio: 0.72  AORTA Ao Root diam: 3.30 cm MITRAL VALVE                TRICUSPID VALVE MV Area (PHT): 2.69 cm     TR Peak grad:   32.5 mmHg MV Area VTI:   2.44 cm     TR Vmax:        285.00 cm/s MV Peak grad:  8.4 mmHg MV Mean grad:  2.0 mmHg     SHUNTS MV Vmax:       1.45 m/s     Systemic VTI:  0.30 m MV Vmean:      70.0 cm/s    Systemic Diam: 2.10 cm MV Decel Time: 282 msec MV E velocity: 89.10 cm/s MV A velocity: 127.00 cm/s MV E/A ratio:  0.70 Mihai Croitoru MD Electronically signed by Sanda Klein MD Signature Date/Time: 03/12/2022/12:35:37 PM    Final    DG Knee Right Port  Result Date: 03/12/2022 CLINICAL DATA:  Knee pain, no known injury, initial encounter EXAM: PORTABLE RIGHT KNEE - 4 VIEW COMPARISON:  None Available. FINDINGS: No acute fracture or dislocation is noted. Generalized soft tissue swelling and edema is noted. Mild degenerative changes of the medial joint space are seen. IMPRESSION: No acute fracture is noted.  Generalized soft tissue edema is seen. Electronically Signed   By: Inez Catalina M.D.   On: 03/12/2022 01:01   CT Angio Chest PE W and/or Wo Contrast  Result Date: 03/11/2022 CLINICAL DATA:  Pulmonary embolus suspected with high probability. Increased shortness of breath and chest pain. Excess fluid. EXAM: CT ANGIOGRAPHY CHEST WITH CONTRAST TECHNIQUE: Multidetector CT imaging of the chest was performed using the standard protocol during bolus administration of intravenous contrast. Multiplanar CT image reconstructions and MIPs were obtained to evaluate the vascular anatomy. RADIATION DOSE REDUCTION: This exam was performed according to the departmental dose-optimization program which includes automated exposure control, adjustment of the mA and/or kV according  to patient size and/or use of iterative reconstruction technique. CONTRAST:  29m OMNIPAQUE IOHEXOL 350 MG/ML SOLN COMPARISON:  Chest radiograph 03/11/2022 FINDINGS: Cardiovascular: Suboptimal contrast bolus and motion artifact limit evaluation of the pulmonary arteries. As visualized, no large central filling defects are demonstrated suggesting no evidence of central pulmonary embolus. Mild cardiac enlargement. No pericardial effusions. Normal caliber thoracic aorta. No aortic dissection. Calcification of the aorta and coronary arteries. Mediastinum/Nodes: No enlarged mediastinal, hilar, or axillary lymph nodes. Thyroid gland, trachea, and esophagus demonstrate no significant findings. Lungs/Pleura: Motion artifact limits evaluation. Lungs appear clear as visualized. No pleural effusions. No pneumothorax. Upper Abdomen: No acute abnormality. Musculoskeletal: Degenerative changes.  No acute bony abnormalities. Review of the MIP images confirms the above findings. IMPRESSION: 1. The examination is technically limited due to suboptimal contrast bolus and motion artifact. As visualized. No large central pulmonary emboli are apparent. 2. Mild cardiac enlargement. 3. Aortic atherosclerosis. 4. Lungs are  grossly clear. Electronically Signed   By: Lucienne Capers M.D.   On: 03/11/2022 22:11   DG Chest 2 View  Result Date: 03/11/2022 CLINICAL DATA:  Bilateral lower extremity swelling, chest pain and shortness of breath EXAM: CHEST - 2 VIEW COMPARISON:  10/21/2018 FINDINGS: Frontal and lateral views of the chest demonstrates mild enlargement of the cardiac silhouette, which may be projectional. No acute airspace disease, effusion, or pneumothorax. There are no acute bony abnormalities. IMPRESSION: 1. No acute intrathoracic process. 2. Mild enlargement of the cardiac silhouette. Electronically Signed   By: Randa Ngo M.D.   On: 03/11/2022 19:34    Microbiology: Results for orders placed or performed during the  hospital encounter of 03/11/22  Urine Culture     Status: Abnormal   Collection Time: 03/12/22  2:44 AM   Specimen: Urine, Clean Catch  Result Value Ref Range Status   Specimen Description   Final    URINE, CLEAN CATCH Performed at [facility], [place] [address] [place], [place] [address]    Special Requests   Final    NONE Performed at [facility], [place] [address] [place], [place] [address]    Culture (A)  Final    <10,000 COLONIES/mL INSIGNIFICANT GROWTH Performed at [facility] [address], [place], [place] [address]    Report Status 03/13/2022 FINAL  Final    Labs: CBC: Recent Labs  Lab 03/11/22 1939 03/12/22 0443  WBC 6.3 6.3  HGB 10.3* 9.4*  HCT 32.8* 29.5*  MCV 87.7 85.8  PLT 154 254   Basic Metabolic Panel: Recent Labs  Lab 03/11/22 1939 03/12/22 0443 03/13/22 0432  NA 139 138 139  K 4.2 4.1 3.9  CL 110 105 106  CO2 '22 25 27  '$ GLUCOSE 127* 104* 133*  BUN 33* 36* 40*  CREATININE 1.67* 1.68* 1.79*  CALCIUM 9.2 8.8* 8.6*  MG  --  1.9  --   PHOS  --  3.7  --    Liver Function Tests: Recent Labs  Lab 03/12/22 0443  AST 30  ALT 15  ALKPHOS 50  BILITOT 0.5  PROT 7.1  ALBUMIN 3.2*    Discharge time spent: 35 minutes.  Signed: Cordelia Poche, MD [facility] 03/13/2022

## 2022-03-13 NOTE — Plan of Care (Signed)

## 2022-03-13 NOTE — TOC Transition Note (Signed)
Transition of Care [facility]) - CM/SW Discharge Note   Patient Details  Name: [name] MRN: [id number] Date of Birth: 1945/10/12  Transition of Care [facility]) CM/SW Contact:  Leeroy Cha, RN Phone Number: 03/13/2022, 12:07 PM   Clinical Narrative:    [id number]/patient discharged to return home.  Chart reviewed for TOC needs.  None found.  Patient self care.   Final next level of care: Home/Self Care Barriers to Discharge: Barriers Resolved   Patient Goals and CMS Choice Patient states their goals for this hospitalization and ongoing recovery are:: to go home CMS Medicare.gov Compare Post Acute Care list provided to:: Patient    Discharge Placement                       Discharge Plan and Services   Discharge Planning Services: CM Consult                                 Social Determinants of Health (SDOH) Interventions     Readmission Risk Interventions   No data to display

## 2022-03-13 NOTE — Plan of Care (Signed)
  Problem: Clinical Measurements: Goal: Cardiovascular complication will be avoided Outcome: Progressing   Problem: Clinical Measurements: Goal: Ability to maintain clinical measurements within normal limits will improve Outcome: Progressing Goal: Cardiovascular complication will be avoided Outcome: Progressing

## 2022-03-13 NOTE — TOC Initial Note (Signed)
Transition of Care [facility]) - Initial/Assessment Note    Patient Details  Name: [name] MRN: [id number] Date of Birth: 1945-07-24  Transition of [facility]) CM/SW Contact:    Leeroy Cha, RN Phone Number: 03/13/2022, 7:50 AM  Clinical Narrative:                  Transition of Care [facility]) Screening Note   Patient Details  Name: [name] Date of Birth: 03-23-1946   Transition of Care [facility]) CM/SW Contact:    Leeroy Cha, RN Phone Number: 03/13/2022, 7:50 AM    Transition of Care [facility]) has reviewed patient and no TOC needs have been identified at this time. We will continue to monitor patient advancement through interdisciplinary progression rounds. If new patient transition needs arise, please place a TOC consult.    Expected Discharge Plan: Home/Self Care Barriers to Discharge: Continued Medical Work up   Patient Goals and CMS Choice Patient states their goals for this hospitalization and ongoing recovery are:: to go home CMS Medicare.gov Compare Post Acute Care list provided to:: Patient    Expected Discharge Plan and Services Expected Discharge Plan: Home/Self Care   Discharge Planning Services: CM Consult   Living arrangements for the past 2 months: Single Family Home                                      Prior Living Arrangements/Services Living arrangements for the past 2 months: Single Family Home Lives with:: Spouse Patient language and need for interpreter reviewed:: Yes Do you feel safe going back to the place where you live?: Yes            Criminal Activity/Legal Involvement Pertinent to Current Situation/Hospitalization: No - Comment as needed  Activities of Daily Living Home Assistive Devices/Equipment: Cane (specify quad or straight) ADL Screening (condition at time of admission) Patient's cognitive ability adequate to safely complete daily activities?: Yes Is the patient deaf or have difficulty  hearing?: No Does the patient have difficulty seeing, even when wearing glasses/contacts?: No Does the patient have difficulty concentrating, remembering, or making decisions?: No Patient able to express need for assistance with ADLs?: Yes Does the patient have difficulty dressing or bathing?: No Independently performs ADLs?: Yes (appropriate for developmental age) (independent with a cane) Does the patient have difficulty walking or climbing stairs?: No Weakness of Legs: None Weakness of Arms/Hands: None  Permission Sought/Granted                  Emotional Assessment Appearance:: Appears stated age Attitude/Demeanor/Rapport: Engaged Affect (typically observed): Calm Orientation: : Oriented to Place, Oriented to Self, Oriented to  Time, Oriented to Situation Alcohol / Substance Use: Never Used Psych Involvement: No (comment)  Admission diagnosis:  Lymphedema [I89.0] Volume overload [E87.70] Patient Active Problem List   Diagnosis Date Noted   Volume overload 03/11/2022   Personal history of thromboembolic disease 52/84/1324   Malnutrition of moderate degree ([place]) 01/12/2014   Pyelonephritis 01/10/2014   Acute pyelonephritis 01/10/2014   Prostate cancer ([place]) 12/30/2013   PCP:  Dixie Dials, MD Pharmacy:   [facility] [address] [place] [address] Phone: [phone] Fax: [phone]     Social Determinants of Health (SDOH) Interventions    Readmission Risk Interventions   No data to display

## 2022-03-13 NOTE — Progress Notes (Signed)
[place] for Warfarin Indication: hx DVT  Allergies  Allergen Reactions   Cephalexin Nausea And Vomiting    patient only took one pill and stated that it made him vomit   Mometasone Other (See Comments)    patient states the cream made sores on his leg    Patient Measurements: Height: '6\' 2"'$  (188 cm) Weight: 115.3 kg (254 lb 3.1 oz) IBW/kg (Calculated) : 82.2  Vital Signs: Temp: 98.1 F (36.7 C) (10/26 0517) Temp Source: Oral (10/26 0517) BP: 153/64 (10/26 0517) Pulse Rate: 83 (10/26 0517)  Labs: Recent Labs    03/11/22 1939 03/11/22 2143 03/12/22 0443 03/12/22 0521 03/13/22 0432  HGB 10.3*  --  9.4*  --   --   HCT 32.8*  --  29.5*  --   --   PLT 154  --  153  --   --   LABPROT  --   --   --  28.6* 31.3*  INR  --   --   --  2.7* 3.1*  CREATININE 1.67*  --  1.68*  --  1.79*  TROPONINIHS 19* 20*  --   --   --      Estimated Creatinine Clearance: 46.6 mL/min (A) (by C-G formula based on SCr of 1.79 mg/dL (H)).   Medications:  Warfarin 7.'5mg'$  po daily- last dose 10/24 @ 1500  Assessment: 75 yo M on chronic warfarin for hx DVT.  INR 2.7- therapeutic on admission.  Admit with HF exacerbation/volume overload. Also on broad spectrum antibiotics (Rocephin) for possible UTI which could interact to increase INR.   CBC: Hg 10.3- low, pltc 154-borderline low, however these appear to be patient's baseline.  No bleeding noted.  03/13/2022 INR 3.1 - at high end of goal No bleeding reported  Goal of Therapy:  INR 2-3   Plan:  Coumadin 6 mg po x1 today Daily INR  Eudelia Bunch, Pharm.D 03/13/2022 8:49 AM

## 2022-03-13 NOTE — Discharge Instructions (Signed)
[name],  You were in the hospital with fluid overload, likely from heart failure. You have improved with Lasix through your IV. Please resume your torsemide on 10/27 and follow-up with Dr. Doylene Canard on 10/30 or 10/31 for repeat labs. If you notice any significant decrease in urination, please call your PCP or return to the hospital.

## 2022-03-13 NOTE — Hospital Course (Addendum)
[name] is a 76 y.o. male with a history of hydrocele, prostate cancer status post prostatectomy, inguinal hernia s/p repair, right lower extremity DVT on Coumadin, hypertension, CKD stage IIIb, lymphedema.  Patient presented secondary to bilateral leg swelling with concern for fluid overload/heart failure.  Patient started on Lasix IV diuresis with good output.  Significant reduction in edema with treatment.  Echo cardiogram without left ventricular EF reduction and persistent grade 1 diastolic heart failure.  Weight on discharge is 114.7 kg

## 2022-03-14 LAB — BASIC METABOLIC PANEL
Anion gap: 6 (ref 5–15)
BUN: 36 mg/dL — ABNORMAL HIGH (ref 8–23)
CO2: 25 mmol/L (ref 22–32)
Calcium: 8.7 mg/dL — ABNORMAL LOW (ref 8.9–10.3)
Chloride: 108 mmol/L (ref 98–111)
Creatinine, Ser: 1.59 mg/dL — ABNORMAL HIGH (ref 0.61–1.24)
GFR, Estimated: 44 mL/min — ABNORMAL LOW (ref >60)
Glucose, Bld: 114 mg/dL — ABNORMAL HIGH (ref 70–99)
Potassium: 3.9 mmol/L (ref 3.5–5.1)
Sodium: 139 mmol/L (ref 135–145)

## 2022-03-14 LAB — PROTIME-INR
INR: 3 — ABNORMAL HIGH (ref 0.8–1.2)
Prothrombin Time: 31.2 seconds — ABNORMAL HIGH (ref 11.4–15.2)

## 2022-03-14 MED ORDER — METHOCARBAMOL 500 MG PO TABS
250.0000 mg | ORAL_TABLET | Freq: Once | ORAL | Status: AC
  Administered 2022-03-14: 250 mg via ORAL
  Filled 2022-03-14: qty 1

## 2022-03-14 NOTE — Plan of Care (Signed)

## 2022-03-14 NOTE — Progress Notes (Signed)
Physical Therapy Treatment Patient Details Name: [name] MRN: [id number] DOB: [birth year] Today's Date: 03/14/2022   History of Present Illness [name] is a 76 y.o. 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 ED with complaints of worsening bilateral lower extremity edema and significant weight gain 25 to 50 pounds in the past month.  Associated with exertional dyspnea. Work-up in the ED revealed volume overload, Bilateral JVD, concern for acute CHF.    PT Comments    Pt making good progress today.  He does require min A to stand from recliner but when elevated 1-2" on bed can stand with supervision.  Min cues for RW use.  Continue plan of care.   Recommendations for follow up therapy are one component of a multi-disciplinary discharge planning process, led by the attending physician.  Recommendations may be updated based on patient status, additional functional criteria and insurance authorization.  Follow Up Recommendations  No PT follow up (lymphedema clinic)     Assistance Recommended at Discharge Set up Supervision/Assistance  Patient can return home with the following Assist for transportation;Help with stairs or ramp for entrance;Assistance with cooking/housework   Equipment Recommendations  None recommended by PT (reports has RW)    Recommendations for Other Services       Precautions / Restrictions Precautions Precautions: Fall     Mobility  Bed Mobility               General bed mobility comments: in chair    Transfers Overall transfer level: Needs assistance Equipment used: Rolling walker (2 wheels) Transfers: Sit to/from Stand Sit to Stand: Min assist, Supervision           General transfer comment: min A from low  surface/recliner and supervision from slighlty elevated surface/bed    Ambulation/Gait Ambulation/Gait assistance: Min guard, Supervision Gait Distance (Feet): 180 Feet Assistive device: Rolling walker (2 wheels) Gait Pattern/deviations: Step-to pattern, Step-through pattern       General Gait Details: Step to gait progressing to step through with improved foot clearance.  Repeated cues to keep RW close and stay inside with turns.  Advised use of RW at d/c   Stairs             Wheelchair Mobility    Modified Rankin (Stroke Patients Only)       Balance Overall balance assessment: Needs assistance Sitting-balance support: No upper extremity supported Sitting balance-Leahy Scale: Good     Standing balance support: Bilateral upper extremity supported, Reliant on assistive device for balance Standing balance-Leahy Scale: Poor Standing balance comment: steady but with RW                            Cognition Arousal/Alertness: Awake/alert Behavior During Therapy: WFL for tasks assessed/performed Overall Cognitive Status: Within Functional Limits for tasks assessed                                          Exercises Other Exercises Other Exercises: STS x 5    General Comments        Pertinent Vitals/Pain Pain Assessment Pain Assessment: Faces Faces Pain Scale: Hurts a little bit Pain Location: chest and back/shoulders: feels like  it is from being in bed, states feels tight/congested Pain Descriptors / Indicators: Sore Pain Intervention(s): Limited activity within patient's tolerance, Monitored during session    Home Living                          Prior Function            PT Goals (current goals can now be found in the care plan section) Progress towards PT goals: Progressing toward goals    Frequency    Min 3X/week      PT Plan Current plan remains appropriate    Co-evaluation               AM-PAC PT "6 Clicks" Mobility   Outcome Measure  Help needed turning from your back to your side while in a flat bed without using bedrails?: A Little Help needed moving from lying on your back to sitting on the side of a flat bed without using bedrails?: A Little Help needed moving to and from a bed to a chair (including a wheelchair)?: A Little Help needed standing up from a chair using your arms (e.g., wheelchair or bedside chair)?: A Little Help needed to walk in hospital room?: A Little Help needed climbing 3-5 steps with a railing? : A Little 6 Click Score: 18    End of Session Equipment Utilized During Treatment: Gait belt Activity Tolerance: Patient tolerated treatment well Patient left: in bed;with call bell/phone within reach (sitting EOB) Nurse Communication: Mobility status PT Visit Diagnosis: Unsteadiness on feet (R26.81);Difficulty in walking, not elsewhere classified (R26.2)     Time: 2778-2423 PT Time Calculation (min) (ACUTE ONLY): 23 min  Charges:  $Gait Training: 8-22 mins $Therapeutic Activity: 8-22 mins                     Abran Richard, PT Acute Rehab [facility] 03/14/2022, 3:21 PM

## 2022-03-14 NOTE — Progress Notes (Signed)
Patient stayed overnight secondary to symptomatic orthostatic hypotension, likely from over-diuresis. Symptoms improved today. No longer orthostatic. Patient is stable for discharge home.  Cordelia Poche, MD [facility] 03/14/2022, 10:30 AM

## 2022-03-14 NOTE — Progress Notes (Signed)
Orthopedic Tech Progress Note Patient Details:  [name] 22-Dec-1945 [id number]  Ortho Devices Type of Ortho Device: Haematologist Ortho Device/Splint Location: Bi LE Ortho Device/Splint Interventions: Application   Post Interventions Patient Tolerated: Well  [name] [name] 03/14/2022, 12:09 PM

## 2022-03-14 NOTE — Progress Notes (Signed)
Occupational Therapy Treatment Patient Details Name: [name] MRN: [id number] DOB: 1945-09-12 Today's Date: 03/14/2022   History of present illness [name] is a 76 y.o. male with medical history significant for hydrocele, followed by urology, left inguinal hernia status post laparoscopic left inguinal hernia repair, right lower extremity DVT on Coumadin, hypertension, CKD 3, prostate cancer, lymphedema, who presented to [facility] ED with complaints of worsening bilateral lower extremity edema and significant weight gain 25 to 50 pounds in the past month.  Associated with exertional dyspnea. Work-up in the ED revealed volume overload, Bilateral JVD, concern for acute CHF.   OT comments  Pt making good progress toward all goals in way of mobility.  Pt continues to struggle with LE adls due to not being able to bend forward to donn socks and underwear.  Pt shown techniques to do this sitting on EOB with bed assisting as well as talked about a reacher.  Pt does not want reacher and states he can use his cane to assist and wife will assist as well.  Chart states pt to have una boot and did not find one in room. Nursing aware and following up. Pt safe to go home but will need assist from wife for some LE adls but is ambulating better. Pt is sore in chest and back from being in bed.  Stretching and mobilizing exercises performed and pt states this feels much better.    Recommendations for follow up therapy are one component of a multi-disciplinary discharge planning process, led by the attending physician.  Recommendations may be updated based on patient status, additional functional criteria and insurance authorization.    Follow Up Recommendations  No OT follow up    Assistance Recommended at Discharge Frequent or constant Supervision/Assistance  Patient can return home with the following  A little help with bathing/dressing/bathroom;Assistance with cooking/housework;Assist for  transportation;Help with stairs or ramp for entrance   Equipment Recommendations  None recommended by OT    Recommendations for Other Services      Precautions / Restrictions Precautions Precautions: None Precaution Comments: lymphedema BLE Restrictions Weight Bearing Restrictions: No       Mobility Bed Mobility               General bed mobility comments: up on EOB on arrival    Transfers Overall transfer level: Needs assistance Equipment used: Rolling walker (2 wheels) Transfers: Sit to/from Stand Sit to Stand: Supervision, From elevated surface           General transfer comment: mod assist from lower surface; supervision from elevated surface     Balance Overall balance assessment: Mild deficits observed, not formally tested                                         ADL either performed or assessed with clinical judgement   ADL Overall ADL's : Needs assistance/impaired Eating/Feeding: Independent;Sitting   Grooming: Wash/dry hands;Wash/dry face;Supervision/safety;Standing Grooming Details (indicate cue type and reason): Pt stood at sink with supervision             Lower Body Dressing: Moderate assistance;Sit to/from stand;Cueing for compensatory techniques Lower Body Dressing Details (indicate cue type and reason): Pt sat EOB to dress LE. Pt would benefit from reacher for dressing at home. Pt states sometimes he uses his cane to assist in dressing and does not want a Investment banker, operational Transfer: Minimal  assistance;Ambulation;Comfort height toilet;Grab bars;Rolling walker (2 wheels) Toilet Transfer Details (indicate cue type and reason): Pt walked to bathroom with min guard Toileting- Clothing Manipulation and Hygiene: Minimal assistance;Sit to/from stand;Cueing for compensatory techniques Toileting - Clothing Manipulation Details (indicate cue type and reason): Pt uses depends/pad at all times at home due to constant leaking      Functional mobility during ADLs: Min guard;Rolling walker (2 wheels) General ADL Comments: Pt doing well with all adls. Pt still struggles with LE dressing. Talked about reacher but pt is not interested as sometimes he will use cane as a reacher to put underwear on.    Extremity/Trunk Assessment Upper Extremity Assessment Upper Extremity Assessment: Generalized weakness   Lower Extremity Assessment Lower Extremity Assessment: Defer to PT evaluation RLE Deficits / Details: lymphedema noted bilaterally        Vision   Vision Assessment?: No apparent visual deficits   Perception Perception Perception: Within Functional Limits   Praxis Praxis Praxis: Intact    Cognition Arousal/Alertness: Awake/alert Behavior During Therapy: WFL for tasks assessed/performed Overall Cognitive Status: Within Functional Limits for tasks assessed                                          Exercises      Shoulder Instructions       General Comments Pt has made good improvements. Chart states pt to have [employer] None found in room. Nursing notified.    Pertinent Vitals/ Pain       Pain Assessment Pain Assessment: Faces Faces Pain Scale: Hurts little more Pain Location: chest and back/shoulders. Feels it is from spending so much time in bed Pain Descriptors / Indicators: Sore Pain Intervention(s): Monitored during session, Repositioned  Home Living                                          Prior Functioning/Environment              Frequency  Min 2X/week        Progress Toward Goals  OT Goals(current goals can now be found in the care plan section)  Progress towards OT goals: Progressing toward goals  Acute Rehab OT Goals Patient Stated Goal: to go home today OT Goal Formulation: With patient Time For Goal Achievement: 03/26/22 Potential to Achieve Goals: [place] Discharge plan remains appropriate    Co-evaluation                  AM-PAC OT "6 Clicks" Daily Activity     Outcome Measure   Help from another person eating meals?: None Help from another person taking care of personal grooming?: None Help from another person toileting, which includes using toliet, bedpan, or urinal?: A Little Help from another person bathing (including washing, rinsing, drying)?: A Little Help from another person to put on and taking off regular upper body clothing?: A Little Help from another person to put on and taking off regular lower body clothing?: A Lot 6 Click Score: 19    End of Session Equipment Utilized During Treatment: Gait belt;Rolling walker (2 wheels)  OT Visit Diagnosis: Unsteadiness on feet (R26.81);Other abnormalities of gait and mobility (R26.89)   Activity Tolerance Patient tolerated treatment well   Patient Left in chair;with call bell/phone within  reach   Nurse Communication Mobility status        Time: 1100-1135 OT Time Calculation (min): 35 min  Charges: OT General Charges $OT Visit: 1 Visit OT Treatments $Self Care/Home Management : 23-37 mins    Glenford Peers 03/14/2022, 11:44 AM

## 2022-03-14 NOTE — Care Management Important Message (Signed)
Important Message  Patient Details IM Letter given Name: [name] MRN: [id number] Date of Birth: 03/04/1946   Medicare Important Message Given:  Yes     Kerin Salen 03/14/2022, 11:29 AM

## 2022-03-26 ENCOUNTER — Encounter (HOSPITAL_BASED_OUTPATIENT_CLINIC_OR_DEPARTMENT_OTHER): Payer: Medicare Other | Attending: Physician Assistant | Admitting: Internal Medicine

## 2022-03-26 DIAGNOSIS — L97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 other part of right lower leg with other specified severity: Secondary | ICD-10-CM | POA: Diagnosis not present

## 2022-03-26 DIAGNOSIS — L97828 Non-pressure chronic ulcer of other part of left lower leg with other specified severity: Secondary | ICD-10-CM | POA: Insufficient documentation

## 2022-03-26 DIAGNOSIS — I890 Lymphedema, not elsewhere classified: Secondary | ICD-10-CM | POA: Insufficient documentation

## 2022-03-26 NOTE — Progress Notes (Signed)
Schafer, [name] ([id number]) [id number] Page 1 of 13 Visit Report for 03/26/2022 Allergy List Details Patient Name: Date of Service: [name], [name] 03/26/2022 9:45 A M Medical Record Number: [id number] Patient Account Number: [id number] Date of Birth/Sex: Treating RN: 11/22/1945 (76 y.o. [name], [name] Primary Care [name]: [name] Other Clinician: Referring [name]: Treating [name]/Extender: [name], [name] in Treatment: 0 Allergies Active Allergies cephalexin Severity: Moderate mometasone furoate Severity: Moderate Allergy Notes Electronic Signature(s) Signed: 03/26/2022 3:55:23 PM By: [name] Hammock RN Entered By: [name] on 03/25/2022 11:31:49 -------------------------------------------------------------------------------- Arrival Information Details Patient Name: Date of Service: [name], [name] 03/26/2022 9:45 A M Medical Record Number: [id number] Patient Account Number: [id number] Date of Birth/Sex: Treating RN: [birth year] (76 y.o. [name], [name] Primary Care Averill Winters: [name] Other Clinician: Referring Nicholes Hibler: Treating [name]/Extender: [name] [name] in Treatment: 0 Visit Information Patient Arrived: [name] Time: 09:59 Accompanied By: daughter Transfer Assistance: None Patient Identification Verified: Yes Secondary Verification Process Completed: Yes Patient Requires Transmission-Based Precautions: No Patient Has Alerts: Yes Patient Alerts: Patient on Blood Thinner Right ABI in clinic [place] History Since Last Visit Added or deleted any medications: No Any new allergies or adverse reactions: No Had a fall or experienced change in activities of daily living that may affect risk of falls: No Signs or symptoms of abuse/neglect since last visito No Hospitalized since last visit: Yes Implantable device outside of the clinic excluding cellular  tissue based products placed in the center since last visit: No Electronic Signature(s) Signed: 03/26/2022 4:37:04 PM By: [name] Pilling RN, [name] Entered By: [name] on 03/26/2022 10:36:54 [name], [name] ([id number]) [id number] Page 2 of 13 -------------------------------------------------------------------------------- Clinic Level of Care Assessment Details Patient Name: Date of Service: [name], [name] 03/26/2022 9:45 A M Medical Record Number: [id number] Patient Account Number: [id number] Date of Birth/Sex: Treating RN: 06/26/1945 (76 y.o. [name], [name] Primary Care Temara Lanum: [name] Other Clinician: Referring [name]: Treating [name]/Extender: [name] [name] in Treatment: 0 Clinic Level of Care Assessment Items TOOL 3 Quantity Score X- 1 0 Use when EandM and Procedure is performed on FOLLOW-UP visit ASSESSMENTS - Nursing Assessment / Reassessment X- 1 10 Reassessment of Co-morbidities (includes updates in patient status) X- 1 5 Reassessment of Adherence to Treatment Plan ASSESSMENTS - Wound and Skin Assessment / Reassessment '[]'$  - Points for Wound Assessment can only be taken for a new wound of unknown or different etiology and a procedure is 0 NOT performed to that wound '[]'$  - 0 Simple Wound Assessment / Reassessment - one wound X- 2 5 Complex Wound Assessment / Reassessment - multiple wounds '[]'$  - 0 Dermatologic / Skin Assessment (not related to wound area) ASSESSMENTS - Focused Assessment X- 2 5 Circumferential Edema Measurements - multi extremities '[]'$  - 0 Nutritional Assessment / Counseling / Intervention '[]'$  - 0 Lower Extremity Assessment (monofilament, tuning fork, pulses) '[]'$  - 0 Peripheral Arterial Disease Assessment (using hand held doppler) ASSESSMENTS - Ostomy and/or Continence Assessment and Care '[]'$  - 0 Incontinence Assessment and Management '[]'$  - 0 Ostomy Care Assessment and Management  (repouching, etc.) PROCESS - Coordination of Care '[]'$  - Points for Discharge Coordination can only be taken for a new wound of unknown or different etiology and a procedure 0 is NOT performed to that wound '[]'$  - 0 Simple Patient / Family Education for ongoing care X- 1 20 Complex (extensive) Patient / Family Education for ongoing care X- 1 10 Staff  obtains Consents, Records, T Results / Process Orders est '[]'$  - 0 Staff telephones HHA, Nursing Homes / Clarify orders / etc '[]'$  - 0 Routine Transfer to another Facility (non-emergent condition) '[]'$  - 0 Routine Hospital Admission (non-emergent condition) X- 1 15 New Admissions / Biomedical engineer / Ordering NPWT Apligraf, etc. , '[]'$  - 0 Emergency Hospital Admission (emergent condition) '[]'$  - 0 Simple Discharge Coordination X- 1 15 Complex (extensive) Discharge Coordination PROCESS - Special Needs '[]'$  - 0 Pediatric / Minor Patient Management '[]'$  - 0 Isolation Patient Management '[]'$  - 0 Hearing / Language / Visual special needs '[]'$  - 0 Assessment of Community assistance (transportation, D/C planning, etc.) '[]'$  - 0 Additional assistance / Altered mentation [name] ([id number]) [id number] Page 3 of 13 '[]'$  - 0 Support Surface(s) Assessment (bed, cushion, seat, etc.) INTERVENTIONS - Wound Cleansing / Measurement '[]'$  - Points for Wound Cleaning / Measurement, Wound Dressing, Specimen Collection and Specimen taken to lab can only 0 be taken for a new wound of unknown or different etiology and a procedure is NOT performed to that wound '[]'$  - 0 Simple Wound Cleansing - one wound X- 2 5 Complex Wound Cleansing - multiple wounds X- 1 5 Wound Imaging (photographs - any number of wounds) '[]'$  - 0 Wound Tracing (instead of photographs) '[]'$  - 0 Simple Wound Measurement - one wound X- 2 5 Complex Wound Measurement - multiple wounds INTERVENTIONS - Wound Dressings '[]'$  - 0 Small Wound Dressing one or multiple  wounds '[]'$  - 0 Medium Wound Dressing one or multiple wounds X- 2 20 Large Wound Dressing one or multiple wounds INTERVENTIONS - Miscellaneous '[]'$  - 0 External ear exam '[]'$  - 0 Specimen Collection (cultures, biopsies, blood, body fluids, etc.) '[]'$  - 0 Specimen(s) / Culture(s) sent or taken to Lab for analysis '[]'$  - 0 Patient Transfer (multiple staff / Civil Service fast streamer / Similar devices) '[]'$  - 0 Simple Staple / Suture removal (25 or less) '[]'$  - 0 Complex Staple / Suture removal (26 or more) '[]'$  - 0 Hypo / Hyperglycemic Management (close monitor of Blood Glucose) X- 1 15 Ankle / Brachial Index (ABI) - do not check if billed separately X- 1 5 Vital Signs Has the patient been seen at the hospital within the last three years: Yes Total Score: 180 Level Of Care: New/Established - Level 5 Electronic Signature(s) Signed: 03/26/2022 3:55:23 PM By: [name] Hammock RN Entered By: [name] on 03/26/2022 11:02:52 -------------------------------------------------------------------------------- Compression Therapy Details Patient Name: Date of Service: [name], [name] 03/26/2022 9:45 A M Medical Record Number: [id number] Patient Account Number: [id number] Date of Birth/Sex: Treating RN: [birth year] (76 y.o. [name] Primary Care Nadiyah Zeis: [name] Other Clinician: Referring Mavrick Mcquigg: Treating Jessaca Philippi/Extender: [name] [name] in Treatment: 0 Compression Therapy Performed for Wound Assessment: Wound #6 Right,Medial Lower Leg Performed By: Clinician [name] Hammock, RN Compression Type: Four Layer Post Procedure Diagnosis Same as Pre-procedure Electronic Signature(s) Signed: 03/26/2022 3:55:23 PM By: [name] Hammock RN Entered By: [name] on 03/26/2022 11:02:01 Evans, [name] ([id number]) [id number] Page 4 of 13 -------------------------------------------------------------------------------- Compression  Therapy Details Patient Name: Date of Service: [name], [name] 03/26/2022 9:45 A M Medical Record Number: [id number] Patient Account Number: [id number] Date of Birth/Sex: Treating RN: 1945/12/13 (75 y.o. [name] Primary Care Ezekeil Bethel: [name] Other Clinician: Referring Braxden Lovering: Treating Vinayak Bobier/Extender: [name] [name] in Treatment: 0 Compression Therapy Performed for Wound Assessment: Wound #7 Left,Medial Lower Leg Performed By: Clinician [name] Hammock, RN Compression  Type: Four Layer Post Procedure Diagnosis Same as Pre-procedure Electronic Signature(s) Signed: 03/26/2022 3:55:23 PM By: [name] Hammock RN Entered By: [name] on 03/26/2022 11:02:01 -------------------------------------------------------------------------------- Encounter Discharge Information Details Patient Name: Date of Service: [name], [name] 03/26/2022 9:45 A M Medical Record Number: [id number] Patient Account Number: [id number] Date of Birth/Sex: Treating RN: Feb 21, 1946 (76 y.o. [name], [name] Primary Care Charron Coultas: [name] Other Clinician: Referring Gjon Letarte: Treating Deicy Rusk/Extender: Alycia Rossetti in Treatment: 0 Encounter Discharge Information Items Discharge Condition: Stable Ambulatory Status: Ambulatory Discharge Destination: Home Transportation: Private Auto Accompanied By: Daughter Schedule Follow-up Appointment: Yes Clinical Summary of Care: Patient Declined Electronic Signature(s) Signed: 03/26/2022 3:55:23 PM By: [name] Hammock RN Entered By: [name] on 03/26/2022 11:03:37 -------------------------------------------------------------------------------- Lower Extremity Assessment Details Patient Name: Date of Service: [name], [name] 03/26/2022 9:45 A M Medical Record Number: [id number] Patient Account Number: [id number] Date of Birth/Sex: Treating RN: 13-Sep-1945 (76 y.o. [name] Primary Care Jeslie Lowe: [name] Other Clinician: Referring Tavarious Freel: Treating Nicklos Gaxiola/Extender: [name] [name] in Treatment: 0 [name], [name] ([id number]) [id number] Page 5 of 13 Edema Assessment Assessed: [Left: Yes] [Right: Yes] Edema: [Left: Yes] [Right: Yes] Calf Left: Right: Point of Measurement: 40 cm From Medial Instep 55 cm 64 cm Ankle Left: Right: Point of Measurement: 13 cm From Medial Instep 42 cm 42 cm Knee To Floor Left: Right: From Medial Instep 51 cm 51 cm Vascular Assessment Pulses: Dorsalis Pedis Palpable: [Left:Yes] [Right:Yes] Doppler Audible: [Left:Yes] [Right:Yes] Posterior Tibial Palpable: [Left:No] [Right:No] Doppler Audible: [Left:Inaudible] [Right:Inaudible] Blood Pressure: Brachial: [Left:188] Ankle: [Left:Dorsalis Pedis: 210 1.12] Notes right ABI >218mHg when attempted. Electronic Signature(s) Signed: 03/26/2022 4:37:04 PM By: DDeon PillingRN, [name] Entered By: DDeon Pillingon 03/26/2022 10:36:02 -------------------------------------------------------------------------------- Multi Wound Chart Details Patient Name: Date of Service: [name], [place]11/12/2021 9:45 A M Medical Record Number: [id number] Account Number: [id number] of Birth/Sex: Treating RN: [birth year]((76y.o. M) Primary Care Jerelyn Trimarco: KBirdie RiddleOther Clinician: Referring Berania Peedin: Treating Elva Breaker/Extender: RDrucilla SchmidtWeeks in Treatment: 0 Vital Signs Height(in): 74 Pulse(bpm): 78 Weight(lbs): 250 Blood Pressure(mmHg): 188/76 Body Mass Index(BMI): 32.1 Temperature(F): 97.9 Respiratory Rate(breaths/min): 20 [6:Photos:] [N/A:N/A [id number] Page 6 of 13] Right, Medial Lower Leg Left, Medial Lower Leg N/A Wound Location: Gradually Appeared Gradually Appeared N/A Wounding Event: Lymphedema Lymphedema N/A Primary Etiology: Anemia,  Lymphedema, Deep Vein Anemia, Lymphedema, Deep Vein N/A Comorbid History: Thrombosis, Hypertension, Received Thrombosis, Hypertension, Received Radiation Radiation 03/05/2022 03/05/2022 N/A Date Acquired: 0 0 N/A [name] of Treatment: Open Open N/A Wound Status: No No N/A Wound Recurrence: 14x13x0.1 6x10x0.1 N/A Measurements L x W x D (cm) 142.942 47.124 N/A A (cm) : rea 14.294 4.712 N/A Volume (cm) : Full Thickness Without Exposed Full Thickness Without Exposed N/A Classification: Support Structures Support Structures Large Large N/A Exudate Amount: Purulent Serosanguineous N/A Exudate Type: yellow, brown, green red, brown N/A Exudate Color: Yes No N/A Foul Odor A Cleansing: fter No N/A N/A Odor Anticipated Due to Product Use: Distinct, outline attached Distinct, outline attached N/A Wound Margin: Medium (34-66%) Medium (34-66%) N/A Granulation A mount: Red, Pink Pink, Pale N/A Granulation Quality: Medium (34-66%) Medium (34-66%) N/A Necrotic Amount: Fat Layer (Subcutaneous Tissue): Yes Fat Layer (Subcutaneous Tissue): Yes N/A Exposed Structures: Fascia: No Fascia: No Tendon: No Tendon: No Muscle: No Muscle: No Joint: No Joint: No Bone: No Bone: No None None N/A Epithelialization: Excoriation: No Excoriation: No N/A Periwound Skin Texture: Induration: No Induration:  No Callus: No Callus: No Crepitus: No Crepitus: No Rash: No Rash: No Scarring: No Scarring: No Maceration: Yes Maceration: Yes N/A Periwound Skin Moisture: Dry/Scaly: No Dry/Scaly: No Hemosiderin Staining: Yes Hemosiderin Staining: Yes N/A Periwound Skin Color: Atrophie Blanche: No Atrophie Blanche: No Cyanosis: No Cyanosis: No Ecchymosis: No Ecchymosis: No Erythema: No Erythema: No Mottled: No Mottled: No Pallor: No Pallor: No Rubor: No Rubor: No Compression Therapy Compression Therapy N/A Procedures Performed: Treatment Notes Wound #6 (Lower Leg) Wound  Laterality: Right, Medial Cleanser Soap and Water Discharge Instruction: May shower and wash wound with dial antibacterial soap and water prior to dressing change. Peri-Wound Care Topical Primary Dressing KerraCel Ag Gelling Fiber Dressing, 4x5 in (silver alginate) Discharge Instruction: Apply silver alginate to wound bed as instructed Secondary Dressing ABD Pad, 5x9 Discharge Instruction: Apply over primary dressing as directed. Woven Gauze Sponge, Non-Sterile 4x4 in Discharge Instruction: Apply over primary dressing as directed. Secured With Compression Wrap FourPress (4 layer compression wrap) Discharge Instruction: Apply four layer compression as directed. May also use Miliken CoFlex 2 layer compression system as alternative. Compression Stockings Add-Ons Iannone, [name] ([id number]) [id number] Page 7 of 13 Wound #7 (Lower Leg) Wound Laterality: Left, Medial Cleanser Soap and Water Discharge Instruction: May shower and wash wound with dial antibacterial soap and water prior to dressing change. Peri-Wound Care Topical Primary Dressing KerraCel Ag Gelling Fiber Dressing, 4x5 in (silver alginate) Discharge Instruction: Apply silver alginate to wound bed as instructed Secondary Dressing ABD Pad, 5x9 Discharge Instruction: Apply over primary dressing as directed. Woven Gauze Sponge, Non-Sterile 4x4 in Discharge Instruction: Apply over primary dressing as directed. Secured With Compression Wrap FourPress (4 layer compression wrap) Discharge Instruction: Apply four layer compression as directed. May also use Miliken CoFlex 2 layer compression system as alternative. Compression Stockings Add-Ons Electronic Signature(s) Signed: 03/26/2022 3:30:35 PM By: Linton Ham MD Entered By: Linton Ham on 03/26/2022 11:15:09 -------------------------------------------------------------------------------- Multi-Disciplinary Care Plan Details Patient Name:  Date of Service: [name], [name] 03/26/2022 9:45 A M Medical Record Number: [id number] Patient Account Number: [id number] Date of Birth/Sex: Treating RN: 29-Jul-1945 (76 y.o. [name], [name] Primary Care Illias Pantano: [name] Other Clinician: Referring Pinki Rottman: Treating Kiala Faraj/Extender: [name] [name] in Treatment: 0 Active Inactive Orientation to the Wound Care Program Nursing Diagnoses: Knowledge deficit related to the wound healing center program Goals: Patient/caregiver will verbalize understanding of the [facility] Program Date Initiated: 03/26/2022 Target Resolution Date: 04/26/2022 Goal Status: Active Interventions: Provide education on orientation to the wound center Notes: Wound/Skin Impairment Nursing Diagnoses: Impaired tissue integrity Knowledge deficit related to ulceration/compromised skin integrity Schrecengost, Rodert ([id number]) [id number] Page 8 of 13 Goals: Patient will have a decrease in wound volume by X% from date: (specify in notes) Date Initiated: 03/26/2022 Target Resolution Date: 04/25/2022 Goal Status: Active Patient/caregiver will verbalize understanding of skin care regimen Date Initiated: 03/26/2022 Target Resolution Date: 04/25/2022 Goal Status: Active Ulcer/skin breakdown will have a volume reduction of 30% by week 4 Date Initiated: 03/26/2022 Target Resolution Date: 04/25/2022 Goal Status: Active Ulcer/skin breakdown will have a volume reduction of 50% by week 8 Date Initiated: 03/26/2022 Target Resolution Date: 04/24/2022 Goal Status: Active Interventions: Assess patient/caregiver ability to obtain necessary supplies Assess patient/caregiver ability to perform ulcer/skin care regimen upon admission and as needed Assess ulceration(s) every visit Notes: Electronic Signature(s) Signed: 03/26/2022 3:55:23 PM By: [name] Hammock RN Entered By: [name] on 03/26/2022  10:20:53 -------------------------------------------------------------------------------- Pain Assessment Details Patient Name: Date of Service: [name], [name]  03/26/2022 9:45 A M Medical Record Number: [id number] Patient Account Number: [id number] Date of Birth/Sex: Treating RN: [birth year] (76 y.o. [name] Primary Care Kwesi Sangha: [name] Other Clinician: Referring Stacyann Mcconaughy: Treating Tytiana Coles/Extender: [name] [name] in Treatment: 0 Active Problems Location of Pain Severity and Description of Pain Patient Has Paino No Site Locations Rate the pain. Current Pain Level: 0 Pain Management and Medication Current Pain Management: Medication: No Cold Application: No Rest: No Massage: No Activity: No T.E.N.S.: No Heat Application: No Leg drop or elevation: No Is the Current Pain Management Adequate: Adequate How does your wound impact your activities of daily livingo Vondrasek, Rebekah ([id number]) [id number] Page 9 of 13 Sleep: No Bathing: No Appetite: No Relationship With Others: No Bladder Continence: No Emotions: No Bowel Continence: No Work: No Toileting: No Drive: No Dressing: No Hobbies: No Engineer, maintenance) Signed: 03/26/2022 4:37:04 PM By: [name] Pilling RN, [name] Entered By: [name] on 03/26/2022 10:10:32 -------------------------------------------------------------------------------- Patient/Caregiver Education Details Patient Name: Date of Service: [name], [name] 11/8/2023andnbsp9:45 A M Medical Record Number: [id number] Patient Account Number: [id number] Date of Birth/Gender: Treating RN: [birth year] (76 y.o. [name] Primary Care Physician: [name] Other Clinician: Referring Physician: Treating Physician/Extender: Alycia Rossetti in Treatment: 0 Education Assessment Education Provided To: Patient Education Topics Provided Welcome T The  [place]: o Methods: Explain/Verbal Responses: Reinforcements needed, State content correctly Electronic Signature(s) Signed: 03/26/2022 3:55:23 PM By: [name] Hammock RN Entered By: [name] on 03/26/2022 10:21:01 -------------------------------------------------------------------------------- Wound Assessment Details Patient Name: Date of Service: Scatena, [name] 03/26/2022 9:45 A M Medical Record Number: [id number] Patient Account Number: [id number] Date of Birth/Sex: Treating RN: 08/09/1945 (76 y.o. [name] Primary Care Sharion Grieves: [name] Other Clinician: Referring Antwanette Wesche: Treating Suheily Birks/Extender: [name] [name] in Treatment: 0 Wound Status Wound Number: 6 Primary Lymphedema Etiology: Wound Location: Right, Medial Lower Leg Wound Open Wounding Event: Gradually Appeared Status: Date Acquired: 03/05/2022 Comorbid Anemia, Lymphedema, Deep Vein Thrombosis, Hypertension, [name] Of Treatment: 0 History: Received Radiation Clustered Wound: No Photos Browder, [name] ([id number]) [id number] Page 10 of 13 Wound Measurements Length: (cm) 14 Width: (cm) 13 Depth: (cm) 0.1 Area: (cm) 142.942 Volume: (cm) 14.294 % Reduction in Area: % Reduction in Volume: Epithelialization: None Tunneling: No Undermining: No Wound Description Classification: Full Thickness Without Exposed Suppor Wound Margin: Distinct, outline attached Exudate Amount: Large Exudate Type: Purulent Exudate Color: yellow, brown, green t Structures Foul Odor After Cleansing: Yes Due to Product Use: No Slough/Fibrino Yes Wound Bed Granulation Amount: Medium (34-66%) Exposed Structure Granulation Quality: Red, Pink Fascia Exposed: No Necrotic Amount: Medium (34-66%) Fat Layer (Subcutaneous Tissue) Exposed: Yes Necrotic Quality: Adherent Slough Tendon Exposed: No Muscle Exposed: No Joint Exposed: No Bone Exposed:  No Periwound Skin Texture Texture Color No Abnormalities Noted: No No Abnormalities Noted: No Callus: No Atrophie Blanche: No Crepitus: No Cyanosis: No Excoriation: No Ecchymosis: No Induration: No Erythema: No Rash: No Hemosiderin Staining: Yes Scarring: No Mottled: No Pallor: No Moisture Rubor: No No Abnormalities Noted: No Dry / Scaly: No Maceration: Yes Treatment Notes Wound #6 (Lower Leg) Wound Laterality: Right, Medial Cleanser Soap and Water Discharge Instruction: May shower and wash wound with dial antibacterial soap and water prior to dressing change. Peri-Wound Care Topical Primary Dressing KerraCel Ag Gelling Fiber Dressing, 4x5 in (silver alginate) Discharge Instruction: Apply silver alginate to wound bed as instructed Secondary Dressing ABD Pad, 5x9 Discharge Instruction: Apply over primary dressing  as directed. Woven Gauze Sponge, Non-Sterile 4x4 in Discharge Instruction: Apply over primary dressing as directed. Secured With Fix, [name] ([id number]) [id number] Page 11 of 13 Compression Wrap FourPress (4 layer compression wrap) Discharge Instruction: Apply four layer compression as directed. May also use Miliken CoFlex 2 layer compression system as alternative. Compression Stockings Add-Ons Electronic Signature(s) Signed: 03/26/2022 4:37:04 PM By: [name] Pilling RN, [name] Entered By: [name] on 03/26/2022 10:25:45 -------------------------------------------------------------------------------- Wound Assessment Details Patient Name: Date of Service: Gerstel, [name] 03/26/2022 9:45 A M Medical Record Number: [id number] Patient Account Number: [id number] Date of Birth/Sex: Treating RN: [birth year] (76 y.o. [name], [name] Primary Care Adream Parzych: [name] Other Clinician: Referring Mortimer Bair: Treating Isami Mehra/Extender: [name] [name] in Treatment: 0 Wound Status Wound Number: 7 Primary  Lymphedema Etiology: Wound Location: Left, Medial Lower Leg Wound Open Wounding Event: Gradually Appeared Status: Date Acquired: 03/05/2022 Comorbid Anemia, Lymphedema, Deep Vein Thrombosis, Hypertension, [name] Of Treatment: 0 History: Received Radiation Clustered Wound: No Photos Wound Measurements Length: (cm) 6 Width: (cm) 10 Depth: (cm) 0.1 Area: (cm) 47.124 Volume: (cm) 4.712 % Reduction in Area: % Reduction in Volume: Epithelialization: None Tunneling: No Undermining: No Wound Description Classification: Full Thickness Without Exposed Suppor Wound Margin: Distinct, outline attached Exudate Amount: Large Exudate Type: Serosanguineous Exudate Color: red, brown t Structures Foul Odor After Cleansing: No Slough/Fibrino Yes Wound Bed Granulation Amount: Medium (34-66%) Exposed Structure Granulation Quality: Pink, Pale Fascia Exposed: No Necrotic Amount: Medium (34-66%) Fat Layer (Subcutaneous Tissue) Exposed: Yes Necrotic Quality: Adherent Slough Tendon Exposed: No Muscle Exposed: No Joint Exposed: No Bone Exposed: No Brockman, Teryl ([id number]) [id number] Page 12 of 13 Periwound Skin Texture Texture Color No Abnormalities Noted: No No Abnormalities Noted: No Callus: No Atrophie Blanche: No Crepitus: No Cyanosis: No Excoriation: No Ecchymosis: No Induration: No Erythema: No Rash: No Hemosiderin Staining: Yes Scarring: No Mottled: No Pallor: No Moisture Rubor: No No Abnormalities Noted: No Dry / Scaly: No Maceration: Yes Treatment Notes Wound #7 (Lower Leg) Wound Laterality: Left, Medial Cleanser Soap and Water Discharge Instruction: May shower and wash wound with dial antibacterial soap and water prior to dressing change. Peri-Wound Care Topical Primary Dressing KerraCel Ag Gelling Fiber Dressing, 4x5 in (silver alginate) Discharge Instruction: Apply silver alginate to wound bed as instructed Secondary  Dressing ABD Pad, 5x9 Discharge Instruction: Apply over primary dressing as directed. Woven Gauze Sponge, Non-Sterile 4x4 in Discharge Instruction: Apply over primary dressing as directed. Secured With Compression Wrap FourPress (4 layer compression wrap) Discharge Instruction: Apply four layer compression as directed. May also use Miliken CoFlex 2 layer compression system as alternative. Compression Stockings Add-Ons Electronic Signature(s) Signed: 03/26/2022 4:37:04 PM By: [name] Pilling RN, [name] Entered By: [name] on 03/26/2022 10:26:08 -------------------------------------------------------------------------------- Vitals Details Patient Name: Date of Service: Ybanez, [place] 03/26/2022 9:45 A M Medical Record Number: [id number] Patient Account Number: [id number] Date of Birth/Sex: Treating RN: 11/09/1945 (76 y.o. [name] Primary Care Cambry Spampinato: [name] Other Clinician: Referring Ted Leonhart: Treating Genevive Printup/Extender: [name] [name] in Treatment: 0 Vital Signs Time Taken: 10:00 Temperature (F): 97.9 Height (in): 74 Pulse (bpm): 78 Source: Stated Respiratory Rate (breaths/min): 20 Weight (lbs): 250 Blood Pressure (mmHg): 188/76 Source: Stated Reference Range: 80 - 120 mg / dl Body Mass Index (BMI): 32.1 Holshouser, Phillips ([id number]) [id number] Page 13 of 13 Notes per patient has not taken his diuretic or BP medication. Electronic Signature(s) Signed: 03/26/2022 4:37:04 PM By: [name] Pilling RN, [name] Entered By: Rolin Barry,  Bobbi on 03/26/2022 10:06:21

## 2022-03-26 NOTE — Progress Notes (Signed)
Cavell, [name] ([id number]) [phone] [id number] Page 1 of 4 Visit Report for 03/26/2022 Abuse Risk Screen Details Patient Name: Date of Service: Meller, [name] 03/26/2022 9:45 A M Medical Record Number: [id number] Patient Account Number: [id number] Date of Birth/Sex: Treating RN: [birth year] (76 y.o. [name] Primary Care Emil Klassen: [name] Other Clinician: Referring Ammie Warrick: Treating Cheyne Bungert/Extender: Drucilla Schmidt Weeks in Treatment: 0 Abuse Risk Screen Items Answer ABUSE RISK SCREEN: Has anyone close to you tried to hurt or harm you recentlyo No Do you feel uncomfortable with anyone in your familyo No Has anyone forced you do things that you didnt want to doo No Electronic Signature(s) Signed: 03/26/2022 4:37:04 PM By: [name], [name] Entered By: Deon Pilling on 03/26/2022 10:09:01 -------------------------------------------------------------------------------- Activities of Daily Living Details Patient Name: Date of Service: Millea, [place] 03/26/2022 9:45 A M Medical Record Number: [id number] Patient Account Number: [id number] Date of Birth/Sex: Treating RN: Mar 01, 1946 (76 y.o. [name] Primary Care Dawnn Nam: [name] Other Clinician: Referring Trenell Concannon: Treating Falon Flinchum/Extender: Drucilla Schmidt Weeks in Treatment: 0 Activities of Daily Living Items Answer Activities of Daily Living (Please select one for each item) Drive Automobile Completely Able T Medications ake Completely Able Use T elephone Completely Able Care for Appearance Completely Able Use T oilet Completely Able Bath / Shower Completely Able Dress Self Completely Able Feed Self Completely Able Walk Need Assistance Get In / Out Bed Completely Able Housework Need Assistance Prepare Meals Need Assistance Handle Money Completely Able Shop for Self Need Assistance Electronic Signature(s) Signed: 03/26/2022  4:37:04 PM By: [name], [name] Entered By: Deon Pilling on 03/26/2022 10:09:23 Bessent, [name] ([id number]) [id number] Page 2 of 4 -------------------------------------------------------------------------------- Education Screening Details Patient Name: Date of Service: Narciso, [place] 03/26/2022 9:45 A M Medical Record Number: [id number] Patient Account Number: [id number] Date of Birth/Sex: Treating RN: 1945-10-21 (76 y.o. [name] Primary Care Starlene Consuegra: [name] Other Clinician: Referring Eathel Pajak: Treating Javonne Dorko/Extender: Alycia Rossetti in Treatment: 0 Primary Learner Assessed: Patient Learning Preferences/Education Level/Primary Language Learning Preference: Explanation, Demonstration, Printed Material Highest Education Level: High School Preferred Language: English Cognitive Barrier Language Barrier: No Translator Needed: No Memory Deficit: No Emotional Barrier: No Cultural/Religious Beliefs Affecting Medical Care: No Physical Barrier Impaired Vision: No Impaired Hearing: No Decreased Hand dexterity: No Knowledge/Comprehension Knowledge Level: High Comprehension Level: High Ability to understand written instructions: High Ability to understand verbal instructions: High Motivation Anxiety Level: Calm Cooperation: Cooperative Education Importance: Acknowledges Need Interest in Health Problems: Asks Questions Perception: Coherent Willingness to Engage in Self-Management High Activities: Readiness to Engage in Self-Management High Activities: Electronic Signature(s) Signed: 03/26/2022 4:37:04 PM By: [name], [name] Entered By: Deon Pilling on 03/26/2022 10:09:51 -------------------------------------------------------------------------------- Fall Risk Assessment Details Patient Name: Date of Service: Spilman, [place] 03/26/2022 9:45 A M Medical Record Number: [id number] Patient  Account Number: [id number] Date of Birth/Sex: Treating RN: 08/30/1945 (76 y.o. [name] Primary Care Elizabella Nolet: [name] Other Clinician: Referring Teghan Philbin: Treating Melesio Madara/Extender: Drucilla Schmidt Weeks in Treatment: 0 Fall Risk Assessment Items Have you had 2 or more falls in the last 12 monthso 0 Yes Grabski, [name] ([id number]) [id number] Page 3 of 4 Have you had any fall that resulted in injury in the last 12 monthso 0 No FALLS RISK SCREEN History of falling - immediate or within 3 months 25 Yes Secondary diagnosis (Do you have 2 or more medical diagnoseso)  0 No Ambulatory aid None/bed rest/wheelchair/nurse 0 No Crutches/cane/walker 0 No Furniture 0 No Intravenous therapy Access/Saline/Heparin Lock 0 No Gait/Transferring Normal/ bed rest/ wheelchair 0 No Weak (short steps with or without shuffle, stooped but able to lift head while walking, may seek 10 Yes support from furniture) Impaired (short steps with shuffle, may have difficulty arising from chair, head down, impaired 0 No balance) Mental Status Oriented to own ability 0 Yes Electronic Signature(s) Signed: 03/26/2022 4:37:04 PM By: [name], [name] Entered By: Deon Pilling on 03/26/2022 10:10:11 -------------------------------------------------------------------------------- Foot Assessment Details Patient Name: Date of Service: Hailes, [place] 03/26/2022 9:45 A M Medical Record Number: [id number] Patient Account Number: [id number] Date of Birth/Sex: Treating RN: [birth year] (76 y.o. [name] Primary Care Henri Guedes: [name] Other Clinician: Referring Zamoria Boss: Treating Zlaty Alexa/Extender: Drucilla Schmidt Weeks in Treatment: 0 Foot Assessment Items Site Locations + = Sensation present, - = Sensation absent, C = Callus, U = Ulcer R = Redness, W = Warmth, M = Maceration, PU = Pre-ulcerative lesion F = Fissure, S =  Swelling, D = Dryness Assessment Right: Left: Other Deformity: No No Prior Foot Ulcer: No No Prior Amputation: No No Charcot Joint: No No Ambulatory Status: Ambulatory With Help Assistance Device: Khayden Herzberg, [name] ([id number]) [id number] Page 4 of 4 Gait: Steady Electronic Signature(s) Signed: 03/26/2022 4:37:04 PM By: [name], [name] Entered By: Deon Pilling on 03/26/2022 10:36:32 -------------------------------------------------------------------------------- Nutrition Risk Screening Details Patient Name: Date of Service: Cast, [place] 03/26/2022 9:45 A M Medical Record Number: [id number] Patient Account Number: [id number] Date of Birth/Sex: Treating RN: 1945-12-23 (76 y.o. [name], [name] Primary Care Lateasha Breuer: [name] Other Clinician: Referring Cleona Doubleday: Treating Wilford Merryfield/Extender: Drucilla Schmidt Weeks in Treatment: 0 Height (in): 74 Weight (lbs): 250 Body Mass Index (BMI): 32.1 Nutrition Risk Screening Items Score Screening NUTRITION RISK SCREEN: I have an illness or condition that made me change the kind and/or amount of food I eat 2 Yes I eat fewer than two meals per day 0 No I eat few fruits and vegetables, or milk products 0 No I have three or more drinks of beer, liquor or wine almost every day 0 No I have tooth or mouth problems that make it hard for me to eat 0 No I don't always have enough money to buy the food I need 0 No I eat alone most of the time 0 No I take three or more different prescribed or over-the-counter drugs a day 1 Yes Without wanting to, I have lost or gained 10 pounds in the last six months 0 No I am not always physically able to shop, cook and/or feed myself 0 No Nutrition Protocols Good Risk Protocol Moderate Risk Protocol 0 Provide education on nutrition High Risk Proctocol Risk Level: Moderate Risk Score: 3 Electronic Signature(s) Signed: 03/26/2022 4:37:04 PM By:  [name], [name] Entered By: Deon Pilling on 03/26/2022 10:10:21

## 2022-03-26 NOTE — Progress Notes (Signed)
[name], [name] ([id number]) [id number] Page 1 of 10 Visit Report for 03/26/2022 Chief Complaint Document Details Patient Name: Date of Service: [name] 03/26/2022 9:45 A M Medical Record Number: [id number] Patient Account Number: [id number] Date of Birth/Sex: Treating RN: May 22, 1945 (76 y.o. M) Primary Care Provider: [name] Other Clinician: Referring Provider: Treating Provider/Extender: Salvadore Oxford, [name] Weeks in Treatment: 0 Information Obtained from: Patient Chief Complaint 02/23/2020; patient is here for wounds on his bilateral lower legs in the setting of severe lymphedema 03/26/2022; patient is here for wounds on his bilateral lower legs medial aspect Electronic Signature(s) Signed: 03/26/2022 3:30:35 PM By: Linton Ham MD Entered By: Linton Ham on 03/26/2022 11:15:53 -------------------------------------------------------------------------------- HPI Details Patient Name: Date of Service: [name], [name] 03/26/2022 9:45 A M Medical Record Number: [id number] Patient Account Number: [id number] Date of Birth/Sex: Treating RN: [birth year] (76 y.o. M) Primary Care Provider: [name] Other Clinician: Referring Provider: Treating Provider/Extender: Drucilla Schmidt Weeks in Treatment: 0 History of Present Illness HPI Description: ADMISSION 02/23/2020 Patient is a 76 year old man who lives in [place] who arrives accompanied by his wife. He has a history of chronic lymphedema and venous insufficiency in his bilateral lower legs which may have something to do that with having a history of DVT as well as being treated for prostate cancer. In any case he recently got compression pumps at home but compliance has been an issue here. He has compression stockings however they are probably not sufficient enough to control swelling. They tell us that things deteriorated for him in late August he was  admitted to [facility] for 7 days. This was with cellulitis I think of his bilateral lower legs. Discharge he was noted to have wounds on his bilateral lower legs. He was discharged on Bactrim. They tried to get him home health through [facility] of course they declined him. His wife is been wrapping these applying some form of silver foam dressing. He has a history of wounds before although nothing that would not heal with basic home topical dressings. He has 2 areas on the left medial, left anterior and left lateral and a smaller area on the right medial. All of these have considerable depth. Past medical history includes iron deficiency anemia, lymphedema followed by the rehab center at [facility] with lymphedema wraps I believe, DVT on chronic anticoagulation, prostate cancer, chronic venous insufficiency, hypertension. As mentioned he has compression pumps but does not use them. ABIs in our clinic were noncompressible bilaterally 10/14; patient with severe bilateral lymphedema right greater than left. He came in with bilateral lower extremity wounds left greater than right. Even though the right side has more of the edema most of the wounds here almost closed on the right medial. He has 3 remaining wounds on the left We have been using silver alginate under 4-layer compression I have been trying to get him to be compliant with his external compression pumps 10/21; patient with 3 small wounds on the left leg and 1 on the right medial in the setting of severe lymphedema and chronic venous insufficiency. We have been using silver alginate under 4-layer compression he is using his external compression pumps twice a day 11/4; ARTERIAL STUDIES on the right show an ABI of 1.02 TBI of 0.858 with biphasic waveforms on the left 0.98 with a TBI of 0.55 and biphasic waveforms. Does not look like he has significant arterial disease. We are treating him for  lymphedema he has compression  pumps. He has punched-out areas on the left anterior left lateral and right medial lower extremities 11/11; after we obtained his arterial studies I put him in 4 layer compression. He is using his compression pumps probably once a day although I have asked [name], [name] ([id number]) [id number] Page 2 of 10 him to do twice. Primary dressing to the wound is silver collagen he has severe lymphedema likely secondary to chronic venous insufficiency. Wounds on the left lateral, left medial and left anterior and a small area on the right medial 12/2; the area on the right anterior lower leg has healed. We initially thought that the area medially had healed as well however when her discharge nurse came in she detected fluid in the wound simply opened up. This is actually worse than I remember this pain. The area on the left lateral potentially slightly smaller He is also complaining about pain in his left hand he says that this is actually been getting some better he has been using topical creams on this. She asked that I look at this 12/9 after last weeks issues we have 2 wounds one on the right medial lower leg and 1 on the left lateral. Both of these are in the same condition. I think because of thickened skin secondary to chronic lymphedema these wounds actually have depth of almost 0.8 cm. 12/16; the patient has 2 small but deep wounds one on the right medial and one on the left lateral. The right medial is actually the worst of these. He arrives in clinic today with absolutely terrible edema in the right leg apparently his 4-layer wrap fell down to just above his ankle he did not think about this he is apparently been continuing to use his compression pump twice a day. The left leg looks a lot better. 05/09/2020 upon evaluation today patient appears to be doing decently well in regard to his wounds. Everything is measuring smaller the right leg still has a little bit  deeper wound in the left seems to be almost completely healed in my opinion I am very pleased in general with how things are progressing. He has a 4- layer compression wrap we have been using endoform today we will probably have to use collagen just based on the fact that we do not have endoform it is on order. 1/6; the patient's wound on the left lateral lower leg has healed. Still has 1 on the right medial. 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 Using compression pumps at home twice a day. 1/13; left lateral lower leg is still healed. He has a deep punched out rectangular shaped wound on the right medial calf. Looking down at this it appears that he is attempting to epithelialize around the edges of the wound and on the base as well. His edema is reasonably well controlled we have been using collagen with absolutely no effect 1/20; left lateral lower leg remains closed he has extremitease stockings. The area on the right medial calf I aggressively debrided last week measures larger but the surface looks better. We have been using Hydrofera Blue. We ran Oasis through his insurance but we have not seen the results of this 1/27; left lower leg wound with chronic venous insufficiency and secondary lymphedema. I did aggressive debridement on this last week the wound seems to have come in healthy looking surface using Hydrofera Blue. He was denied for Oasis 2/3; small divot in the right medial lower leg.  Under illumination the walls of this divot are epithelialized however the base has slough which I removed with a curette we have been using Hydrofera Blue 2/10 small divot on the right medial lower leg pinpoint illumination at the base of this cone-shaped wound. We have been using Hydrofera Blue but I will switch to calcium alginate this week 2/17; the small divot on the right medial lower leg is fully epithelialized. There is no visible open area under illumination. He has his own  stocking for the right leg similar to the one he has been wearing on the left. 03/26/2022; READMISSION This is a now 76 year old man that we had in the clinic from 02/23/2020 through 07/05/2020. At that point he had bilateral lower extremity wounds left greater than right in the setting of severe lymphedema. He had already obtained compression pumps ordered for him I think from the wound care clinic in [facility] so I do not really have record of what he has been using. He claims to be using them once a day but there is a problem with the sleeve on the left leg. About 2 weeks ago he was hospitalized from 03/11/2022 through 59/56/3875 with diastolic congestive heart failure. 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 He was diuresed. Developed some prerenal azotemia and he has not been taking any diuretics currently. He has not been putting stockings on his legs since he got out of hospital and still has his legs dependent for long periods. Past medical history history of prostate cancer treated with prostatectomy and radiation this was apparently about 8 years ago, history of DVT on chronic Coumadin, history of lymphedema was managed for a while at the clinic in [place]. History of inguinal hernia repair in September 22, hypertension, stage IIIb chronic renal failure ABIs today were noncompressible on the right 1.12 on the left Electronic Signature(s) Signed: 03/26/2022 3:30:35 PM By: Linton Ham MD Entered By: Linton Ham on 03/26/2022 11:19:18 -------------------------------------------------------------------------------- Physical Exam Details Patient Name: Date of Service: [name], [name] 03/26/2022 9:45 A M Medical Record Number: [id number] Patient Account Number: [id number] Date of Birth/Sex: Treating RN: Oct 24, 1945 (76 y.o. M) Primary Care Provider: [name] Other Clinician: Referring Provider: Treating  Provider/Extender: Drucilla Schmidt Weeks in Treatment: 0 Constitutional Patient is hypertensive.. Pulse regular and within target range for patient.Marland Kitchen Respirations regular, non-labored and within target range.. Temperature is normal and within the target range for the patient.Marland Kitchen Appears in no distress. Respiratory work of breathing is normal. Bilateral breath sounds are clear and equal in all lobes with no wheezes, rales or rhonchi.. Cardiovascular JVP is not elevated. 3 out of 6 pansystolic murmur at the apex also well heard at the lower left sternal border.. In spite of the edema in his feet I believe his pulses are appreciable.. Edema present in both extremities.This is nonpitting and severe extending up into his thighs. [name], [name] ([id number]) [id number] Page 3 of 10 Notes Wound exam; bilateral medial lower legs just above the ankles weeping edema fluid. There are tremendous skin changes of chronic lymphedema although nothing here looks particularly ominous. Electronic Signature(s) Signed: 03/26/2022 3:30:35 PM By: Linton Ham MD Entered By: Linton Ham on 03/26/2022 11:21:32 -------------------------------------------------------------------------------- Physician Orders Details Patient Name: Date of Service: [name], [name] 03/26/2022 9:45 A M Medical Record Number: [id number] Patient Account Number: [id number] Date of Birth/Sex: Treating RN: Jul 12, 1945 (76 y.o. [name], [place] Primary Care Provider: [name] Other  Clinician: Referring Provider: Treating Provider/Extender: Salvadore Oxford, [name] Weeks in Treatment: 0 Verbal / Phone Orders: No Diagnosis Coding Follow-up Appointments ppointment in 1 week. Burman Blacksmith on Wednesday's Return A Bathing/ Shower/ Hygiene May shower with protection but do not get wound dressing(s) wet. Edema Control - Lymphedema / SCD / Other Lymphedema Pumps. Use Lymphedema  pumps on leg(s) 2-3 times a day for 45-60 minutes. If wearing any wraps or hose, do not remove them. Continue exercising as instructed. Elevate legs to the level of the heart or above for 30 minutes daily and/or when sitting, a frequency of: Avoid standing for long periods of time. Wound Treatment Wound #6 - Lower Leg Wound Laterality: Right, Medial Cleanser: Soap and Water 1 x Per Week/7 Days Discharge Instructions: May shower and wash wound with dial antibacterial soap and water prior to dressing change. Prim Dressing: KerraCel Ag Gelling Fiber Dressing, 4x5 in (silver alginate) 1 x Per Week/7 Days ary Discharge Instructions: Apply silver alginate to wound bed as instructed Secondary Dressing: ABD Pad, 5x9 1 x Per Week/7 Days Discharge Instructions: Apply over primary dressing as directed. Secondary Dressing: Woven Gauze Sponge, Non-Sterile 4x4 in 1 x Per Week/7 Days Discharge Instructions: Apply over primary dressing as directed. Compression Wrap: FourPress (4 layer compression wrap) 1 x Per Week/7 Days Discharge Instructions: Apply four layer compression as directed. May also use Miliken CoFlex 2 layer compression system as alternative. Wound #7 - Lower Leg Wound Laterality: Left, Medial Cleanser: Soap and Water 1 x Per Week/7 Days Discharge Instructions: May shower and wash wound with dial antibacterial soap and water prior to dressing change. Prim Dressing: KerraCel Ag Gelling Fiber Dressing, 4x5 in (silver alginate) 1 x Per Week/7 Days ary Discharge Instructions: Apply silver alginate to wound bed as instructed Secondary Dressing: ABD Pad, 5x9 1 x Per Week/7 Days Discharge Instructions: Apply over primary dressing as directed. Secondary Dressing: Woven Gauze Sponge, Non-Sterile 4x4 in 1 x Per Week/7 Days Discharge Instructions: Apply over primary dressing as directed. Compression Wrap: FourPress (4 layer compression wrap) 1 x Per Week/7 Days Discharge Instructions: Apply four  layer compression as directed. May also use Miliken CoFlex 2 layer compression system as alternative. Pflaum, [name] ([id number]) [id number] Page 4 of 10 Electronic Signature(s) Signed: 03/26/2022 3:30:35 PM By: Linton Ham MD Signed: 03/26/2022 3:55:23 PM By: Rhae Hammock RN Entered By: Rhae Hammock on 03/26/2022 11:01:24 -------------------------------------------------------------------------------- Problem List Details Patient Name: Date of Service: [place], [place] 03/26/2022 9:45 A M Medical Record Number: [id number] Patient Account Number: [id number] Date of Birth/Sex: Treating RN: 03-30-1946 (76 y.o. M) Primary Care Provider: [name] Other Clinician: Referring Provider: Treating Provider/Extender: Drucilla Schmidt Weeks in Treatment: 0 Active Problems ICD-10 Encounter Code Description Active Date MDM Diagnosis L97.828 Non-pressure chronic ulcer of other part of left lower leg with other specified 03/26/2022 No Yes severity 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 other part of right lower leg with other specified 03/26/2022 No Yes severity I89.0 Lymphedema, not elsewhere classified 03/26/2022 No Yes Inactive Problems Resolved Problems Electronic Signature(s) Signed: 03/26/2022 3:30:35 PM By: Linton Ham MD Entered By: Linton Ham on 03/26/2022 11:15:01 -------------------------------------------------------------------------------- Progress Note Details Patient Name: Date of Service: [place]kin, [place] 03/26/2022 9:45 A M Medical Record Number: [id number] Patient Account Number: [id number] Date of Birth/Sex: Treating RN: 20-Oct-1945 (76 y.o. M) Primary Care Provider: [name] Other Clinician: Referring Provider: Treating Provider/Extender: Alycia Rossetti in Treatment: 0 Subjective Chief Complaint Information obtained  from Patient 02/23/2020; patient is here for wounds on his  bilateral lower legs in the setting of severe lymphedema 03/26/2022; patient is here for wounds on his bilateral lower [name], [name] ([id number]) [id number] Page 5 of 10 legs medial aspect History of Present Illness (HPI) ADMISSION 02/23/2020 Patient is a 76 year old man who lives in [place] who arrives accompanied by his wife. He has a history of chronic lymphedema and venous insufficiency in his bilateral lower legs which may have something to do that with having a history of DVT as well as being treated for prostate cancer. In any case he recently got compression pumps at home but compliance has been an issue here. He has compression stockings however they are probably not sufficient enough to control swelling. They tell us that things deteriorated for him in late August he was admitted to [facility] for 7 days. This was with cellulitis I think of his bilateral lower legs. Discharge he was noted to have wounds on his bilateral lower legs. He was discharged on Bactrim. They tried to get him home health through [facility] part C of course they declined him. His wife is been wrapping these applying some form of silver foam dressing. He has a history of wounds before although nothing that would not heal with basic home topical dressings. He has 2 areas on the left medial, left anterior and left lateral and a smaller area on the right medial. All of these have considerable depth. Past medical history includes iron deficiency anemia, lymphedema followed by the rehab center at [facility] with lymphedema wraps I believe, DVT on chronic anticoagulation, prostate cancer, chronic venous insufficiency, hypertension. As mentioned he has compression pumps but does not use them. ABIs in our clinic were noncompressible bilaterally 10/14; patient with severe bilateral lymphedema right greater than left. He came in with bilateral lower extremity wounds left greater  than right. Even though the right side has more of the edema most of the wounds here almost closed on the right medial. He has 3 remaining wounds on the left We have been using silver alginate under 4-layer compression I have been trying to get him to be compliant with his external compression pumps 10/21; patient with 3 small wounds on the left leg and 1 on the right medial in the setting of severe lymphedema and chronic venous insufficiency. We have been using silver alginate under 4-layer compression he is using his external compression pumps twice a day 11/4; ARTERIAL STUDIES on the right show an ABI of 1.02 TBI of 0.858 with biphasic waveforms on the left 0.98 with a TBI of 0.55 and biphasic waveforms. Does not look like he has significant arterial disease. We are treating him for lymphedema he has compression pumps. He has punched-out areas on the left anterior left lateral and right medial lower extremities 11/11; after we obtained his arterial studies I put him in 4 layer compression. He is using his compression pumps probably once a day although I have asked him to do twice. Primary dressing to the wound is silver collagen he has severe lymphedema likely secondary to chronic venous insufficiency. Wounds on the left lateral, left medial and left anterior and a small area on the right medial 12/2; the area on the right anterior lower leg has healed. We initially thought that the area medially had healed as well however when her discharge nurse came in she detected fluid in the wound simply opened up. This is actually worse than I  remember this pain. The area on the left lateral potentially slightly smaller He is also complaining about pain in his left hand he says that this is actually been getting some better he has been using topical creams on this. She asked that I look at this 12/9 after last weeks issues we have 2 wounds one on the right medial lower leg and 1 on the left lateral. Both of  these are in the same condition. I think because of thickened skin secondary to chronic lymphedema these wounds actually have depth of almost 0.8 cm. 12/16; the patient has 2 small but deep wounds one on the right medial and one on the left lateral. The right medial is actually the worst of these. He arrives in clinic today with absolutely terrible edema in the right leg apparently his 4-layer wrap fell down to just above his ankle he did not think about this he is apparently been continuing to use his compression pump twice a day. The left leg looks a lot better. 05/09/2020 upon evaluation today patient appears to be doing decently well in regard to his wounds. Everything is measuring smaller the right leg still has a little bit deeper wound in the left seems to be almost completely healed in my opinion I am very pleased in general with how things are progressing. He has a 4- layer compression wrap we have been using endoform today we will probably have to use collagen just based on the fact that we do not have endoform it is on order. 1/6; the patient's wound on the left lateral lower leg has healed. Still has 1 on the right medial. 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 Using compression pumps at home twice a day. 1/13; left lateral lower leg is still healed. He has a deep punched out rectangular shaped wound on the right medial calf. Looking down at this it appears that he is attempting to epithelialize around the edges of the wound and on the base as well. His edema is reasonably well controlled we have been using collagen with absolutely no effect 1/20; left lateral lower leg remains closed he has extremitease stockings. The area on the right medial calf I aggressively debrided last week measures larger but the surface looks better. We have been using Hydrofera Blue. We ran Oasis through his insurance but we have not seen the results of this 1/27; left lower leg wound with  chronic venous insufficiency and secondary lymphedema. I did aggressive debridement on this last week the wound seems to have come in healthy looking surface using Hydrofera Blue. He was denied for Oasis 2/3; small divot in the right medial lower leg. Under illumination the walls of this divot are epithelialized however the base has slough which I removed with a curette we have been using Hydrofera Blue 2/10 small divot on the right medial lower leg pinpoint illumination at the base of this cone-shaped wound. We have been using Hydrofera Blue but I will switch to calcium alginate this week 2/17; the small divot on the right medial lower leg is fully epithelialized. There is no visible open area under illumination. He has his own stocking for the right leg similar to the one he has been wearing on the left. 03/26/2022; READMISSION This is a now 76 year old man that we had in the clinic from 02/23/2020 through 07/05/2020. At that point he had bilateral lower extremity wounds left greater than right in the setting of severe lymphedema. He had already  obtained compression pumps ordered for him I think from the wound care clinic in [facility] so I do not really have record of what he has been using. He claims to be using them once a day but there is a problem with the sleeve on the left leg. About 2 weeks ago he was hospitalized from 03/11/2022 through 70/78/6754 with diastolic congestive heart failure. 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 He was diuresed. Developed some prerenal azotemia and he has not been taking any diuretics currently. He has not been putting stockings on his legs since he got out of hospital and still has his legs dependent for long periods. Past medical history history of prostate cancer treated with prostatectomy and radiation this was apparently about 8 years ago, history of DVT on chronic Coumadin, history of lymphedema  was managed for a while at the clinic in [place]. History of inguinal hernia repair in September 22, hypertension, stage IIIb chronic renal failure ABIs today were noncompressible on the right 1.12 on the left Patient History Allergies cephalexin (Severity: Moderate), mometasone furoate (Severity: Moderate) [name], [name] ([id number]) [id number] Page 6 of 10 Family History Diabetes - Child, No family history of Cancer, Heart Disease, Hereditary Spherocytosis, Hypertension, Kidney Disease, Lung Disease, Seizures, Stroke, Thyroid Problems, Tuberculosis. Social History Never smoker, Marital Status - Married, Alcohol Use - Rarely, Drug Use - No History, Caffeine Use - Daily - coffee. Medical History Eyes Denies history of Cataracts, Glaucoma, Optic Neuritis Hematologic/Lymphatic Patient has history of Anemia - iron, Lymphedema Cardiovascular Patient has history of Deep Vein Thrombosis, Hypertension Oncologic Patient has history of Received Radiation Hospitalization/Surgery History - hydrocele excision left 09/18/2021. - inguinal hernia repair 01/29/2021. Medical A Surgical History Notes nd Cardiovascular varicose veins Genitourinary stageIIIA CKD Oncologic prostate cancer Review of Systems (ROS) Gastrointestinal Denies complaints or symptoms of Frequent diarrhea, Nausea, Vomiting. Endocrine Denies complaints or symptoms of Heat/cold intolerance. Genitourinary Denies complaints or symptoms of Frequent urination. Integumentary (Skin) Denies complaints or symptoms of Wounds. Musculoskeletal Denies complaints or symptoms of Muscle Pain, Muscle Weakness. Neurologic Denies complaints or symptoms of Numbness/parasthesias. Psychiatric Denies complaints or symptoms of Claustrophobia. Objective Constitutional Patient is hypertensive.. Pulse regular and within target range for patient.Marland Kitchen Respirations regular, non-labored and within target range..  Temperature is normal and within the target range for the patient.Marland Kitchen Appears in no distress. Vitals Time Taken: 10:00 AM, Height: 74 in, Source: Stated, Weight: 250 lbs, Source: Stated, BMI: 32.1, Temperature: 97.9 F, Pulse: 78 bpm, Respiratory Rate: 20 breaths/min, Blood Pressure: 188/76 mmHg. General Notes: per patient has not taken his diuretic or BP medication. Respiratory work of breathing is normal. Bilateral breath sounds are clear and equal in all lobes with no wheezes, rales or rhonchi.. Cardiovascular JVP is not elevated. 3 out of 6 pansystolic murmur at the apex also well heard at the lower left sternal border.. In spite of the edema in his feet I believe his pulses are appreciable.. Edema present in both extremities.This is nonpitting and severe extending up into his thighs. General Notes: Wound exam; bilateral medial lower legs just above the ankles weeping edema fluid. There are tremendous skin changes of chronic lymphedema although nothing here looks particularly ominous. Integumentary (Hair, Skin) Wound #6 status is Open. Original cause of wound was Gradually Appeared. The date acquired was: 03/05/2022. The wound is located on the Right,Medial Lower Leg. The wound measures 14cm length x 13cm width x 0.1cm depth; 142.942cm^2 area and 14.294cm^3  volume. There is Fat Layer (Subcutaneous Tissue) exposed. There is no tunneling or undermining noted. There is a large amount of purulent drainage noted. Foul odor after cleansing was noted. The wound margin is distinct with the outline attached to the wound base. There is medium (34-66%) red, pink granulation within the wound bed. There is a medium (34-66%) amount of necrotic tissue within the wound bed including Adherent Slough. The periwound skin appearance exhibited: Maceration, Hemosiderin Staining. The periwound skin appearance did not exhibit: Callus, Crepitus, Excoriation, Induration, Rash, Scarring, Dry/Scaly, Atrophie Blanche,  Cyanosis, Ecchymosis, Mottled, Pallor, Rubor, Erythema. Wound #7 status is Open. Original cause of wound was Gradually Appeared. The date acquired was: 03/05/2022. The wound is located on the Left,Medial Lower Leg. The wound measures 6cm length x 10cm width x 0.1cm depth; 47.124cm^2 area and 4.712cm^3 volume. There is Fat Layer (Subcutaneous Tissue) exposed. There is no tunneling or undermining noted. There is a large amount of serosanguineous drainage noted. The wound margin is distinct with the outline attached to the wound base. There is medium (34-66%) pink, pale granulation within the wound bed. There is a medium (34-66%) amount of necrotic tissue within the wound bed including Adherent Slough. The periwound skin appearance exhibited: Maceration, Hemosiderin Staining. The periwound skin appearance did not exhibit: Callus, Crepitus, Excoriation, Induration, Rash, Scarring, Dry/Scaly, Atrophie Blanche, Cyanosis, Ecchymosis, Mottled, Pallor, Rubor, Erythema. 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 There was a Four Layer Compression Therapy Procedure by Rhae Hammock, RN. Post procedure Diagnosis Wound #6: Same as Pre-Procedure Wound #7 Pre-procedure diagnosis of Wound #7 is a Lymphedema located on the Left,Medial Lower Leg . There was a Four Layer Compression Therapy Procedure by Rhae Hammock, RN. Post procedure Diagnosis Wound #7: Same as Pre-Procedure Plan Follow-up Appointments: Return Appointment in 1 week. [facility] on Wednesday's Bathing/ Shower/ Hygiene: May shower with  protection but do not get wound dressing(s) wet. Edema Control - Lymphedema / SCD / Other: Lymphedema Pumps. Use Lymphedema pumps on leg(s) 2-3 times a day for 45-60 minutes. If wearing any wraps or hose, do not remove them. Continue exercising as instructed. Elevate legs to the level of the heart or above for 30 minutes daily and/or when sitting, a frequency of: Avoid standing for long periods of time. WOUND #6: - Lower Leg Wound Laterality: Right, Medial Cleanser: Soap and Water 1 x Per Week/7 Days Discharge Instructions: May shower and wash wound with dial antibacterial soap and water prior to dressing change. Prim Dressing: KerraCel Ag Gelling Fiber Dressing, 4x5 in (silver alginate) 1 x Per Week/7 Days ary Discharge Instructions: Apply silver alginate to wound bed as instructed Secondary Dressing: ABD Pad, 5x9 1 x Per Week/7 Days Discharge Instructions: Apply over primary dressing as directed. Secondary Dressing: Woven Gauze Sponge, Non-Sterile 4x4 in 1 x Per Week/7 Days Discharge Instructions: Apply over primary dressing as directed. Com pression Wrap: FourPress (4 layer compression wrap) 1 x Per Week/7 Days Discharge Instructions: Apply four layer compression as directed. May also use Miliken CoFlex 2 layer compression system as alternative. WOUND #7: - Lower Leg Wound Laterality: Left, Medial Cleanser: Soap and Water 1 x Per Week/7 Days Discharge Instructions: May  shower and wash wound with dial antibacterial soap and water prior to dressing change. Prim Dressing: KerraCel Ag Gelling Fiber Dressing, 4x5 in (silver alginate) 1 x Per Week/7 Days ary Discharge Instructions: Apply silver alginate to wound bed as instructed Secondary Dressing: ABD Pad, 5x9 1 x Per Week/7 Days Discharge Instructions: Apply over primary dressing as directed. Secondary Dressing: Woven Gauze Sponge, Non-Sterile 4x4 in 1 x Per Week/7 Days Discharge Instructions: Apply over primary dressing as directed. Com  pression Wrap: FourPress (4 layer compression wrap) 1 x Per Week/7 Days Discharge Instructions: Apply four layer compression as directed. May also use Miliken CoFlex 2 layer compression system as alternative. 1. 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 2. The patient has a compression pump at home but we have no record of it. If this is damaged I have asked the patient to call the company after looking for identification on the sleeve of the pumps. They may be able to repair the once he has and on the chance that these are more than 78 years old he may be eligible for replacements through insurance. 3. The lymphedema itself has been present for many years. The patient states it got a lot worse surrounding his prostate cancer treatment and radiation. That is possible as a contributing factor. He also has a history of DVT on chronic anticoagulation 4. We applied silver alginate sit to vet ABDs and put him in 4-layer compression. I have no real concerns about his significant arterial insufficiency. Even though our ABIs were noncompressible on the right I think he has adequate flow to tolerate this. I have also asked him to use his compression pumps for an hour twice a day 5. He will need compression stockings ordered however I would like to see how much fluid we can mobilize of the lower legs before we do this 6. There is no real evidence of heart failure at the bedside Electronic Signature(s) Signed: 03/26/2022 3:30:35 PM By: Linton Ham MD [facility], [facility] ([id number]) PM By: Linton Ham MD [phone].pdf Page 8 of 10 Signed: 03/26/2022 3:30:35 Entered By: Linton Ham on 03/26/2022 11:24:09 -------------------------------------------------------------------------------- HxROS Details Patient Name: Date of Service: [name], [place] 03/26/2022 9:45 A M Medical Record Number: [id number] Patient Account Number: [id number] Date of Birth/Sex: Treating  RN: 05/04/1946 (76 y.o. [name], [place] Primary Care Provider: [name] Other Clinician: Referring Provider: Treating Provider/Extender: Drucilla Schmidt Weeks in Treatment: 0 Gastrointestinal Complaints and Symptoms: Negative for: Frequent diarrhea; Nausea; Vomiting Endocrine Complaints and Symptoms: Negative for: Heat/cold intolerance Genitourinary Complaints and Symptoms: Negative for: Frequent urination Medical History: Past Medical History Notes: stageIIIA CKD Integumentary (Skin) Complaints and Symptoms: Negative for: Wounds Musculoskeletal Complaints and Symptoms: Negative for: Muscle Pain; Muscle Weakness Neurologic Complaints and Symptoms: Negative for: Numbness/parasthesias Psychiatric Complaints and Symptoms: Negative for: Claustrophobia Eyes Medical History: Negative for: Cataracts; Glaucoma; Optic Neuritis Hematologic/Lymphatic Medical History: Positive for: Anemia - iron; Lymphedema Cardiovascular Medical History: Positive for: Deep Vein Thrombosis; Hypertension Past Medical History Notes: varicose veins Immunological Karapetyan, [name] ([id number]) [id number] Page 9 of 10 Oncologic Medical History: Positive for: Received Radiation Past Medical History Notes: prostate cancer Immunizations Pneumococcal Vaccine: Received Pneumococcal Vaccination: Yes Received Pneumococcal Vaccination On or After 60th Birthday: Yes Implantable Devices No devices added Hospitalization / Surgery History Type of Hospitalization/Surgery hydrocele excision left 09/18/2021 inguinal hernia repair 01/29/2021 Family and Social History Cancer: No; 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; Heart Disease: No; Hereditary Spherocytosis: No; Hypertension: No; Kidney Disease: No; Lung Disease: No;  Seizures: No; Stroke: No; Thyroid Problems: No; Tuberculosis: No; Never smoker; Marital Status - Married; Alcohol Use: Rarely; Drug Use: No History;  Caffeine Use: Daily - coffee; Financial Concerns: No; Food, Clothing or Shelter Needs: No; Support System Lacking: No; Transportation Concerns: No Engineer, maintenance) Signed: 03/26/2022 3:30:35 PM By: Linton Ham MD Signed: 03/26/2022 3:55:23 PM By: Rhae Hammock RN Signed: 03/26/2022 4:37:04 PM By: [name], [name] Entered By: Deon Pilling on 03/26/2022 10:08:52 -------------------------------------------------------------------------------- SuperBill Details Patient Name: Date of Service: [name], [name] 03/26/2022 Medical Record Number: [id number] Patient Account Number: [id number] Date of Birth/Sex: Treating RN: 11-30-1945 (76 y.o. [name], [place] Primary Care Provider: [name] Other Clinician: Referring Provider: Treating Provider/Extender: Drucilla Schmidt Weeks in Treatment: 0 Diagnosis Coding ICD-10 Codes Code Description [phone] Non-pressure chronic ulcer of other part of left lower leg with other specified severity 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 other part of right lower leg with other specified severity I89.0 Lymphedema, not elsewhere classified Facility Procedures : CPT4 Code: [id number] Description: 09233 - WOUND CARE VISIT-LEV 5 EST PT Modifier: Quantity: 1 Physician Procedures : CPT4 Code Description Modifier [id number] - WC PHYS LEVEL 4 - EST PT ICD-10 Diagnosis Description 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 I89.0 Lymphedema, not elsewhere classified Quantity: 1 Electronic Signature(s) Rathman, [name] ([id number]) [id number] Page 10 of 10 Signed: 03/26/2022 3:30:35 PM By: Linton Ham MD Entered By: Linton Ham on 03/26/2022 11:24:33

## 2022-04-02 ENCOUNTER — Encounter (HOSPITAL_BASED_OUTPATIENT_CLINIC_OR_DEPARTMENT_OTHER): Payer: Medicare Other | Admitting: Physician Assistant

## 2022-04-02 DIAGNOSIS — L97828 Non-pressure chronic ulcer of other part of left lower leg with other specified severity: Secondary | ICD-10-CM | POA: Diagnosis not present

## 2022-04-02 NOTE — Progress Notes (Signed)
[name] [name] ([id number]) [id number] Page 1 of 8 Visit Report for 04/02/2022 Chief Complaint Document Details Patient Name: Date of Service: [name] [name] [name] 04/02/2022 2:00 PM Medical Record Number: [id number] Patient Account Number: [id number] Date of Birth/Sex: Treating RN: 06-28-1945 (76 y.o. M) Primary Care Provider: Birdie [name] Other Clinician: Referring Provider: Treating Provider/Extender: [name] [name], [name] [name] in Treatment: 1 Information Obtained from: Patient Chief Complaint 02/23/2020; patient is here for wounds on his bilateral lower legs in the setting of severe lymphedema 03/26/2022; patient is here for wounds on his bilateral lower legs medial aspect Electronic Signature(s) Signed: 04/02/2022 2:19:29 PM By: [name] Keeler PA-C Entered By: [name] [name] on 04/02/2022 14:19:28 -------------------------------------------------------------------------------- HPI Details Patient Name: Date of Service: [name] [name] 04/02/2022 2:00 PM Medical Record Number: [id number] Patient Account Number: [id number] Date of Birth/Sex: Treating RN: [birth year] (76 y.o. M) Primary Care Provider: Birdie [name] Other Clinician: Referring Provider: Treating Provider/Extender: [name] [name] [name] in Treatment: 1 History of Present Illness HPI Description: ADMISSION 02/23/2020 Patient is a 76 year old man who lives in [place] who arrives accompanied by his wife. He has a history of chronic lymphedema and venous insufficiency in his bilateral lower legs which may have something to do that with having a history of DVT as well as being treated for prostate cancer. In any case he recently got compression pumps at home but compliance has been an issue here. He has compression stockings however they are probably not sufficient enough to control swelling. They tell us that things deteriorated for him in late August he was  admitted to [facility] for 7 days. This was with cellulitis I think of his bilateral lower legs. Discharge he was noted to have wounds on his bilateral lower legs. He was discharged on Bactrim. They tried to get him home health through [facility] part C of course they declined him. His wife is been wrapping these applying some form of silver foam dressing. He has a history of wounds before although nothing that would not heal with basic home topical dressings. He has 2 areas on the left medial, left anterior and left lateral and a smaller area on the right medial. All of these have considerable depth. Past medical history includes iron deficiency anemia, lymphedema followed by the rehab center at [facility] with lymphedema wraps I believe, DVT on chronic anticoagulation, prostate cancer, chronic venous insufficiency, hypertension. As mentioned he has compression pumps but does not use them. ABIs in our clinic were noncompressible bilaterally 10/14; patient with severe bilateral lymphedema right greater than left. He came in with bilateral lower extremity wounds left greater than right. Even though the right side has more of the edema most of the wounds here almost closed on the right medial. He has 3 remaining wounds on the left We have been using silver alginate under 4-layer compression I have been trying to get him to be compliant with his external compression pumps 10/21; patient with 3 small wounds on the left leg and 1 on the right medial in the setting of severe lymphedema and chronic venous insufficiency. We have been using silver alginate under 4-layer compression he is using his external compression pumps twice a day 11/4; ARTERIAL STUDIES on the right show an ABI of 1.02 TBI of 0.858 with biphasic waveforms on the left 0.98 with a TBI of 0.55 and biphasic waveforms. Does not look like he has significant arterial disease. We are treating  him for lymphedema he has compression  pumps. He has punched-out areas on the left anterior left lateral and right medial lower extremities 11/11; after we obtained his arterial studies I put him in 4 layer compression. He is using his compression pumps probably once a day although I have asked [name], [name] [name] ([id number]) [id number] Page 2 of 8 him to do twice. Primary dressing to the wound is silver collagen he has severe lymphedema likely secondary to chronic venous insufficiency. Wounds on the left lateral, left medial and left anterior and a small area on the right medial 12/2; the area on the right anterior lower leg has healed. We initially thought that the area medially had healed as well however when her discharge nurse came in she detected fluid in the wound simply opened up. This is actually worse than I remember this pain. The area on the left lateral potentially slightly smaller He is also complaining about pain in his left hand he says that this is actually been getting some better he has been using topical creams on this. She asked that I look at this 12/9 after last [name] issues we have 2 wounds one on the right medial lower leg and 1 on the left lateral. Both of these are in the same condition. I think because of thickened skin secondary to chronic lymphedema these wounds actually have depth of almost 0.8 cm. 12/16; the patient has 2 small but deep wounds one on the right medial and one on the left lateral. The right medial is actually the worst of these. He arrives in clinic today with absolutely terrible edema in the right leg apparently his 4-layer wrap fell down to just above his ankle he did not think about this he is apparently been continuing to use his compression pump twice a day. The left leg looks a lot better. 05/09/2020 upon evaluation today patient appears to be doing decently well in regard to his wounds. Everything is measuring smaller the right leg still has a little bit deeper  wound in the left seems to be almost completely healed in my opinion I am very pleased in general with how things are progressing. He has a 4- layer compression wrap we have been using endoform today we will probably have to use collagen just based on the fact that we do not have endoform it is on order. 1/6; the patient's wound on the left lateral lower leg has healed. Still has 1 on the right medial. 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 Using compression pumps at home twice a day. 1/13; left lateral lower leg is still healed. He has a deep punched out rectangular shaped wound on the right medial calf. Looking down at this it appears that he is attempting to epithelialize around the edges of the wound and on the base as well. His edema is reasonably well controlled we have been using collagen with absolutely no effect 1/20; left lateral lower leg remains closed he has extremitease stockings. The area on the right medial calf I aggressively debrided last week measures larger but the surface looks better. We have been using Hydrofera Blue. We ran Oasis through his insurance but we have not seen the results of this 1/27; left lower leg wound with chronic venous insufficiency and secondary lymphedema. I did aggressive debridement on this last week the wound seems to have come in healthy looking surface using Hydrofera Blue. He was denied for Oasis 2/3; small divot in the right medial  lower leg. Under illumination the walls of this divot are epithelialized however the base has slough which I removed with a curette we have been using Hydrofera Blue 2/10 small divot on the right medial lower leg pinpoint illumination at the base of this cone-shaped wound. We have been using Hydrofera Blue but I will switch to calcium alginate this week 2/17; the small divot on the right medial lower leg is fully epithelialized. There is no visible open area under illumination. He has his own  stocking for the right leg similar to the one he has been wearing on the left. 03/26/2022; READMISSION This is a now 76 year old man that we had in the clinic from 02/23/2020 through 07/05/2020. At that point he had bilateral lower extremity wounds left greater than right in the setting of severe lymphedema. He had already obtained compression pumps ordered for him I think from the wound care clinic in [facility] so I do not really have record of what he has been using. He claims to be using them once a day but there is a problem with the sleeve on the left leg. About 2 [name] ago he was hospitalized from 03/11/2022 through 26/83/4196 with diastolic congestive heart failure. 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 He was diuresed. Developed some prerenal azotemia and he has not been taking any diuretics currently. He has not been putting stockings on his legs since he got out of hospital and still has his legs dependent for long periods. 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 History of inguinal hernia repair in September 22, hypertension, stage IIIb chronic renal failure ABIs today were noncompressible on the right 1.12 on the left 04-02-2022 upon evaluation today patient appears to be doing well currently in regard to his legs I do feel like both areas that are draining are actually much drier than they were in the picture last week although the left is drier than the right. He is tolerating the 4-layer compression wraps at this point he did contact the pump company and they are actually working on getting him a new compression sleeve for one of his legs which have previously popped and was not functioning properly. Electronic  Signature(s) Signed: 04/02/2022 3:28:33 PM By: [name] Keeler PA-C Entered By: [name] [name] on 04/02/2022 15:28:33 -------------------------------------------------------------------------------- Physical Exam Details Patient Name: Date of Service: [name] [name] [name] 04/02/2022 2:00 PM Medical Record Number: [id number] Patient Account Number: [id number] Date of Birth/Sex: Treating RN: 11-25-1945 (76 y.o. M) Primary Care Provider: Birdie [name] Other Clinician: Referring Provider: Treating Provider/Extender: [name] [name], [name] [name] in Treatment: 1 Constitutional Obese and well-hydrated in no acute distress. Respiratory normal breathing without difficulty. Sobecki, [name] [name] ([id number]) [id number] Page 3 of 8 Psychiatric this patient is able to make decisions and demonstrates good insight into disease process. Alert and Oriented x 3. pleasant and cooperative. Notes Upon inspection patient's wound bed actually showed signs of good granulation and epithelization he actually is drying up especially on the left the right is also doing so but is just much slower to do it. Fortunately I do not see any evidence of infection or worsening which is great news. Electronic Signature(s) Signed: 04/02/2022 3:29:03 PM By: [name] Keeler PA-C Previous Signature: 04/02/2022 3:28:49  PM Version By: [name] Keeler PA-C Entered By: [name] [name] on 04/02/2022 15:29:03 -------------------------------------------------------------------------------- Physician Orders Details Patient Name: Date of Service: [name], Shamrock 04/02/2022 2:00 PM Medical Record Number: [id number] Patient Account Number: [id number] Date of Birth/Sex: Treating RN: Nov 06, 1945 (76 y.o. [name], [name] Primary Care Provider: Birdie [name] Other Clinician: Referring Provider: Treating Provider/Extender: [name] [name] [name] in Treatment: 1 Verbal / Phone Orders:  No Diagnosis Coding ICD-10 Coding Code Description 7858248280 Non-pressure chronic ulcer of other part of left lower leg with other specified severity 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 other part of right lower leg with other specified severity I89.0 Lymphedema, not elsewhere classified Follow-up Appointments ppointment in 2 [name]. Burman Blacksmith on Wednesday's Return A Nurse Visit: - Next Wednesday 04/09/2022- On nurse visit measure legs and provide patient with those leg measurements and Elastic therapy info. Other: - Ensure to use your lymphedema pumps once your new sleeve comes in. Bathing/ Shower/ Hygiene May shower with protection but do not get wound dressing(s) wet. Edema Control - Lymphedema / SCD / Other Lymphedema Pumps. Use Lymphedema pumps on leg(s) 2-3 times a day for 45-60 minutes. If wearing any wraps or hose, do not remove them. Continue exercising as instructed. Elevate legs to the level of the heart or above for 30 minutes daily and/or when sitting, a frequency of: Avoid standing for long periods of time. Wound Treatment Wound #6 - Lower Leg Wound Laterality: Right, Medial Cleanser: Soap and Water 1 x Per Week/7 Days Discharge Instructions: May shower and wash wound with dial antibacterial soap and water prior to dressing change. Peri-Wound Care: Sween Lotion (Moisturizing lotion) 1 x Per Week/7 Days Discharge Instructions: Apply moisturizing lotion as directed Prim Dressing: KerraCel Ag Gelling Fiber Dressing, 4x5 in (silver alginate) 1 x Per Week/7 Days ary Discharge Instructions: Apply silver alginate to wound bed as instructed Secondary Dressing: ABD Pad, 5x9 1 x Per Week/7 Days Discharge Instructions: Apply over primary dressing as directed. Secondary Dressing: Woven Gauze Sponge, Non-Sterile 4x4 in 1 x Per Week/7 Days Discharge Instructions: Apply over primary dressing as directed. Compression Wrap: FourPress (4 layer compression wrap) 1 x Per Week/7  Days Discharge Instructions: Apply four layer compression as directed. May also use Miliken CoFlex 2 layer compression system as alternative. Wound #7 - Lower Leg Wound Laterality: Left, Medial Cleanser: Soap and Water 1 x Per Week/7 Days Jha, [name] [name] ([id number]) [id number] Page 4 of 8 Discharge Instructions: May shower and wash wound with dial antibacterial soap and water prior to dressing change. Peri-Wound Care: Sween Lotion (Moisturizing lotion) 1 x Per Week/7 Days Discharge Instructions: Apply moisturizing lotion as directed Prim Dressing: KerraCel Ag Gelling Fiber Dressing, 4x5 in (silver alginate) 1 x Per Week/7 Days ary Discharge Instructions: Apply silver alginate to wound bed as instructed Secondary Dressing: ABD Pad, 5x9 1 x Per Week/7 Days Discharge Instructions: Apply over primary dressing as directed. Secondary Dressing: Woven Gauze Sponge, Non-Sterile 4x4 in 1 x Per Week/7 Days Discharge Instructions: Apply over primary dressing as directed. Compression Wrap: FourPress (4 layer compression wrap) 1 x Per Week/7 Days Discharge Instructions: Apply four layer compression as directed. May also use Miliken CoFlex 2 layer compression system as alternative. Patient Medications llergies: cephalexin, mometasone furoate A Notifications Medication Indication Start End 04/02/2022 lidocaine DOSE topical 5 % cream - cream topical applied only in clinic for debridements. Electronic Signature(s) Unsigned Entered By: Deon Pilling on 04/02/2022 15:25:52 -------------------------------------------------------------------------------- Problem List Details Patient Name: Date  of Service: Kuehnle, [name] [name] 04/02/2022 2:00 PM Medical Record Number: [id number] Patient Account Number: [id number] Date of Birth/Sex: Treating RN: 1946/02/01 (76 y.o. M) Primary Care Provider: Birdie [name] Other Clinician: Referring Provider: Treating Provider/Extender: [name] [name] [name] in Treatment: 1 Active Problems ICD-10 Encounter Code Description Active Date MDM Diagnosis L97.828 Non-pressure chronic ulcer of other part of left lower leg with other specified 03/26/2022 No Yes severity 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 other part of right lower leg with other specified 03/26/2022 No Yes severity I89.0 Lymphedema, not elsewhere classified 03/26/2022 No Yes Inactive Problems Resolved Problems [name], Lorrin ([id number]) [id number] Page 5 of 8 Electronic Signature(s) Signed: 04/02/2022 2:19:19 PM By: [name] Keeler PA-C Entered By: [name] [name] on 04/02/2022 14:19:19 -------------------------------------------------------------------------------- Progress Note Details Patient Name: Date of Service: [name] [name] [name] 04/02/2022 2:00 PM Medical Record Number: [id number] Patient Account Number: [id number] Date of Birth/Sex: Treating RN: 09-27-1945 (76 y.o. M) Primary Care Provider: Birdie [name] Other Clinician: Referring Provider: Treating Provider/Extender: [name] [name] [name] in Treatment: 1 Subjective Chief Complaint Information obtained from Patient 02/23/2020; patient is here for wounds on his bilateral lower legs in the setting of severe lymphedema 03/26/2022; patient is here for wounds on his bilateral lower legs medial aspect History of Present Illness (HPI) ADMISSION 02/23/2020 Patient is a 76 year old man who lives in [place] who arrives accompanied by his wife. He has a history of chronic lymphedema and venous insufficiency in his bilateral lower legs which may have something to do that with having a history of DVT as well as being treated for prostate cancer. In any case he recently got compression pumps at home but compliance has been an issue here. He has compression stockings however they are probably not sufficient enough to control swelling. They tell us  that things deteriorated for him in late August he was admitted to [facility] for 7 days. This was with cellulitis I think of his bilateral lower legs. Discharge he was noted to have wounds on his bilateral lower legs. He was discharged on Bactrim. They tried to get him home health through [facility] part C of course they declined him. His wife is been wrapping these applying some form of silver foam dressing. He has a history of wounds before although nothing that would not heal with basic home topical dressings. He has 2 areas on the left medial, left anterior and left lateral and a smaller area on the right medial. All of these have considerable depth. Past medical history includes iron deficiency anemia, lymphedema followed by the rehab center at [facility] with lymphedema wraps I believe, DVT on chronic anticoagulation, prostate cancer, chronic venous insufficiency, hypertension. As mentioned he has compression pumps but does not use them. ABIs in our clinic were noncompressible bilaterally 10/14; patient with severe bilateral lymphedema right greater than left. He came in with bilateral lower extremity wounds left greater than right. Even though the right side has more of the edema most of the wounds here almost closed on the right medial. He has 3 remaining wounds on the left We have been using silver alginate under 4-layer compression I have been trying to get him to be compliant with his external compression pumps 10/21; patient with 3 small wounds on the left leg and 1 on the right medial in the setting of severe lymphedema and chronic venous insufficiency. We have been using silver alginate under 4-layer compression he is  using his external compression pumps twice a day 11/4; ARTERIAL STUDIES on the right show an ABI of 1.02 TBI of 0.858 with biphasic waveforms on the left 0.98 with a TBI of 0.55 and biphasic waveforms. Does not look like he has significant arterial disease.  We are treating him for lymphedema he has compression pumps. He has punched-out areas on the left anterior left lateral and right medial lower extremities 11/11; after we obtained his arterial studies I put him in 4 layer compression. He is using his compression pumps probably once a day although I have asked him to do twice. Primary dressing to the wound is silver collagen he has severe lymphedema likely secondary to chronic venous insufficiency. Wounds on the left lateral, left medial and left anterior and a small area on the right medial 12/2; the area on the right anterior lower leg has healed. We initially thought that the area medially had healed as well however when her discharge nurse came in she detected fluid in the wound simply opened up. This is actually worse than I remember this pain. The area on the left lateral potentially slightly smaller He is also complaining about pain in his left hand he says that this is actually been getting some better he has been using topical creams on this. She asked that I look at this 12/9 after last [name] issues we have 2 wounds one on the right medial lower leg and 1 on the left lateral. Both of these are in the same condition. I think because of thickened skin secondary to chronic lymphedema these wounds actually have depth of almost 0.8 cm. 12/16; the patient has 2 small but deep wounds one on the right medial and one on the left lateral. The right medial is actually the worst of these. He arrives in clinic today with absolutely terrible edema in the right leg apparently his 4-layer wrap fell down to just above his ankle he did not think about this he is apparently been continuing to use his compression pump twice a day. The left leg looks a lot better. 05/09/2020 upon evaluation today patient appears to be doing decently well in regard to his wounds. Everything is measuring smaller the right leg still has a little bit deeper wound in the left seems to  be almost completely healed in my opinion I am very pleased in general with how things are progressing. He has a 4- layer compression wrap we have been using endoform today we will probably have to use collagen just based on the fact that we do not have endoform it is on order. 1/6; the patient's wound on the left lateral lower leg has healed. Still has 1 on the right medial. 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 Using compression pumps at home twice a day. 1/13; left lateral lower leg is still healed. He has a deep punched out rectangular shaped wound on the right medial calf. Looking down at this it appears that he is attempting to epithelialize around the edges of the wound and on the base as well. His edema is reasonably well controlled we have been using collagen with absolutely no effect 1/20; left lateral lower leg remains closed he has extremitease stockings. The area on the right medial calf I aggressively debrided last week measures larger but the surface looks better. We have been using Hydrofera Blue. We ran Oasis through his insurance but we have not seen the results of this 1/27; left lower leg  wound with chronic venous insufficiency and secondary lymphedema. I did aggressive debridement on this last week the wound seems to have come in healthy looking surface using Hydrofera Blue. [name] [name] [name] ([id number]) [id number] Page 6 of 8 He was denied for Oasis 2/3; small divot in the right medial lower leg. Under illumination the walls of this divot are epithelialized however the base has slough which I removed with a curette we have been using Hydrofera Blue 2/10 small divot on the right medial lower leg pinpoint illumination at the base of this cone-shaped wound. We have been using Hydrofera Blue but I will switch to calcium alginate this week 2/17; the small divot on the right medial lower leg is fully epithelialized. There is no  visible open area under illumination. He has his own stocking for the right leg similar to the one he has been wearing on the left. 03/26/2022; READMISSION This is a now 76 year old man that we had in the clinic from 02/23/2020 through 07/05/2020. At that point he had bilateral lower extremity wounds left greater than right in the setting of severe lymphedema. He had already obtained compression pumps ordered for him I think from the wound care clinic in [facility] so I do not really have record of what he has been using. He claims to be using them once a day but there is a problem with the sleeve on the left leg. About 2 [name] ago he was hospitalized from 03/11/2022 through 01/75/1025 with diastolic congestive heart failure. 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 He was diuresed. Developed some prerenal azotemia and he has not been taking any diuretics currently. He has not been putting stockings on his legs since he got out of hospital and still has his legs dependent for long periods. Past medical history history of prostate cancer treated with prostatectomy and radiation this was apparently about 8 years ago, history of DVT on chronic Coumadin, history of lymphedema was managed for a while at the clinic in [place]. History of inguinal hernia repair in September 22, hypertension, stage IIIb chronic renal failure ABIs today were noncompressible on the right 1.12 on the left 04-02-2022 upon evaluation today patient appears to be doing well currently in regard to his legs I do feel like both areas that are draining are actually much drier than they were in the picture last week although the left is drier than the right. He is tolerating the 4-layer compression wraps at this point he did contact the pump company and they are actually working on getting him a new compression sleeve for one of his legs which have previously popped and was  not functioning properly. Objective Constitutional Obese and well-hydrated in no acute distress. Vitals Time Taken: 2:30 PM, Height: 74 in, Weight: 250 lbs, BMI: 32.1, Temperature: 97.7 F, Pulse: 98 bpm, Respiratory Rate: 18 breaths/min, Blood Pressure: 178/76 mmHg. Respiratory normal breathing without difficulty. Psychiatric this patient is able to make decisions and demonstrates good insight into disease process. Alert and Oriented x 3. pleasant and cooperative. General Notes: Upon inspection patient's wound bed actually showed signs of good granulation and epithelization he actually is drying up especially on the left the right is also doing so but is just much slower to do it. Fortunately I do not see any evidence of infection or worsening which is great news. Integumentary (Hair, Skin) Wound #6 status is Open. Original cause of wound was Gradually Appeared. The date acquired  was: 03/05/2022. The wound has been in treatment 1 [name]. The wound is located on the Right,Medial Lower Leg. The wound measures 8cm length x 5.5cm width x 0.1cm depth; 34.558cm^2 area and 3.456cm^3 volume. There is Fat Layer (Subcutaneous Tissue) exposed. There is no tunneling or undermining noted. There is a large amount of purulent drainage noted. Foul odor after cleansing was noted. The wound margin is distinct with the outline attached to the wound base. There is no granulation within the wound bed. There is a large (67-100%) amount of necrotic tissue within the wound bed including Adherent Slough. The periwound skin appearance exhibited: Hemosiderin Staining. The periwound skin appearance did not exhibit: Callus, Crepitus, Excoriation, Induration, Rash, Scarring, Dry/Scaly, Maceration, Atrophie Blanche, Cyanosis, Ecchymosis, Mottled, Pallor, Rubor, Erythema. Wound #7 status is Open. Original cause of wound was Gradually Appeared. The date acquired was: 03/05/2022. The wound has been in treatment 1 [name]. The  wound is located on the Left,Medial Lower Leg. The wound measures 2.5cm length x 1.5cm width x 0.1cm depth; 2.945cm^2 area and 0.295cm^3 volume. There is Fat Layer (Subcutaneous Tissue) exposed. There is no tunneling or undermining noted. There is a large amount of serosanguineous drainage noted. The wound margin is distinct with the outline attached to the wound base. There is medium (34-66%) pink, pale granulation within the wound bed. There is a medium (34-66%) amount of necrotic tissue within the wound bed including Adherent Slough. The periwound skin appearance exhibited: Hemosiderin Staining. The periwound skin appearance did not exhibit: Callus, Crepitus, Excoriation, Induration, Rash, Scarring, Dry/Scaly, Maceration, Atrophie Blanche, Cyanosis, Ecchymosis, Mottled, Pallor, Rubor, Erythema. 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 There was a Four Layer Compression Therapy Procedure by Deon Pilling, RN. Post procedure Diagnosis Wound #6: Same as Pre-Procedure Wound #7 Pre-procedure diagnosis of Wound #7 is a Lymphedema located on the Left,Medial Lower Leg . There was a Four Layer Compression Therapy Procedure by Deon Pilling, RN. Post procedure Diagnosis Wound #7: Same as Pre-Procedure Plan Follow-up Appointments: Return Appointment in 2 [name]. Burman Blacksmith on Wednesday's Nurse Visit: - Next Wednesday 04/09/2022- On nurse visit measure legs and provide patient with those leg measurements and Elastic therapy info. Other: - Ensure to use your lymphedema pumps once your new sleeve comes  in. Bathing/ Shower/ Hygiene: May shower with protection but do not get wound dressing(s) wet. Edema Control - Lymphedema / SCD / Other: Lymphedema Pumps. Use Lymphedema pumps on leg(s) 2-3 times a day for 45-60 minutes. If wearing any wraps or hose, do not remove them. Continue exercising as instructed. Elevate legs to the level of the heart or above for 30 minutes daily and/or when sitting, a frequency of: Avoid standing for long periods of time. The following medication(s) was prescribed: lidocaine topical 5 % cream cream topical applied only in clinic for debridements. was prescribed at facility WOUND #6: - Lower Leg Wound Laterality: Right, Medial Cleanser: Soap and Water 1 x Per Week/7 Days Discharge Instructions: May shower and wash wound with dial antibacterial soap and water prior to dressing change. Peri-Wound Care: Sween Lotion (Moisturizing lotion) 1 x Per Week/7 Days Discharge Instructions: Apply moisturizing lotion as directed Prim Dressing: KerraCel Ag  Gelling Fiber Dressing, 4x5 in (silver alginate) 1 x Per Week/7 Days ary Discharge Instructions: Apply silver alginate to wound bed as instructed Secondary Dressing: ABD Pad, 5x9 1 x Per Week/7 Days Discharge Instructions: Apply over primary dressing as directed. Secondary Dressing: Woven Gauze Sponge, Non-Sterile 4x4 in 1 x Per Week/7 Days Discharge Instructions: Apply over primary dressing as directed. Com pression Wrap: FourPress (4 layer compression wrap) 1 x Per Week/7 Days Discharge Instructions: Apply four layer compression as directed. May also use Miliken CoFlex 2 layer compression system as alternative. WOUND #7: - Lower Leg Wound Laterality: Left, Medial Cleanser: Soap and Water 1 x Per Week/7 Days Discharge Instructions: May shower and wash wound with dial antibacterial soap and water prior to dressing change. Peri-Wound Care: Sween Lotion (Moisturizing lotion) 1 x Per Week/7 Days Discharge Instructions: Apply  moisturizing lotion as directed Prim Dressing: KerraCel Ag Gelling Fiber Dressing, 4x5 in (silver alginate) 1 x Per Week/7 Days ary Discharge Instructions: Apply silver alginate to wound bed as instructed Secondary Dressing: ABD Pad, 5x9 1 x Per Week/7 Days Discharge Instructions: Apply over primary dressing as directed. Secondary Dressing: Woven Gauze Sponge, Non-Sterile 4x4 in 1 x Per Week/7 Days Discharge Instructions: Apply over primary dressing as directed. Com pression Wrap: FourPress (4 layer compression wrap) 1 x Per Week/7 Days Discharge Instructions: Apply four layer compression as directed. May also use Miliken CoFlex 2 layer compression system as alternative. 1. I would recommend that we going continue to monitor for any signs of worsening or infection. Obviously if anything changes he knows contact the office let me know otherwise working to see about getting with the 4-layer compression wraps today. 2. Also to see about giving him the information for elastic therapy to get some new compression socks following. 3. I am also can recommend he should continue to elevate his legs, take his fluid pills, and use his lymphedema pumps. He said he gets his new sleeping get back onto a normal regimen with this. wrap we will see patient back for reevaluation in 1 week here in the clinic. If anything worsens or changes patient will contact our office for additional recommendations. Electronic Signature(s) Signed: 04/02/2022 3:29:32 PM By: [name] Keeler PA-C Entered By: [name] [name] on 04/02/2022 15:29:32 Plocher, [name] [name] ([id number]) [id number] Page 8 of 8 -------------------------------------------------------------------------------- SuperBill Details Patient Name: Date of Service: [name] [name] [name] 04/02/2022 Medical Record Number: [id number] Patient Account Number: [id number] Date of Birth/Sex: Treating RN: 18-May-1946 (76 y.o. [name] Primary  Care Provider: Birdie [name] Other Clinician: Referring Provider: Treating Provider/Extender: [name] [name] [name] in Treatment: 1 Diagnosis Coding ICD-10 Codes Code Description [phone] Non-pressure chronic ulcer of other part of left lower leg with other specified severity 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 other part of right lower leg with other specified severity I89.0 Lymphedema, not elsewhere classified Facility Procedures : CPT4: Code [id number] Description: 81 BILATERAL: Application of multi-layer venous compression system; leg (below knee), including ankle and t. Modifier: Quantity: 1 Physician Procedures : CPT4 Code Description Modifier [id number] - WC PHYS LEVEL 3 - EST PT ICD-10 Diagnosis Description 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 I89.0 Lymphedema, not elsewhere classified Quantity: 1 Electronic Signature(s) Signed: 04/02/2022 3:29:53 PM By: [name] Keeler PA-C Entered By: [name] [name] on 04/02/2022 15:29:52

## 2022-04-03 NOTE — Progress Notes (Signed)
Burtch, [name] ([id number]) [id number] Page 1 of 8 Visit Report for 04/02/2022 Arrival Information Details Patient Name: Date of Service: Pinn, [name] 04/02/2022 2:00 PM Medical Record Number: [id number] Patient Account Number: [id number] Date of Birth/Sex: Treating RN: 12/22/1945 (76 y.o. M) Primary Care Hollin Crewe: [name] Other Clinician: Referring Jacayla Nordell: Treating Damacio Weisgerber/Extender: [name] Weeks in Treatment: 1 Visit Information History Since Last Visit Added or deleted any medications: No Patient Arrived: Cane Any new allergies or adverse reactions: No Arrival Time: 14:24 Pain Present Now: No Accompanied By: self Transfer Assistance: None Patient Identification Verified: Yes Secondary Verification Process Completed: Yes Patient Requires Transmission-Based Precautions: No Patient Has Alerts: Yes Patient Alerts: Patient on Blood Thinner Right ABI in clinic [place] Electronic Signature(s) Signed: 04/02/2022 4:33:40 PM By: Erenest Blank Entered By: Erenest Blank on 04/02/2022 14:28:17 -------------------------------------------------------------------------------- Compression Therapy Details Patient Name: Date of Service: [name], [name] 04/02/2022 2:00 PM Medical Record Number: [id number] Patient Account Number: [id number] Date of Birth/Sex: Treating RN: Nov 27, 1945 (76 y.o. [name] Primary Care Raguel Kosloski: [name] Other Clinician: Referring Stan Cantave: Treating Azaria Bartell/Extender: [name] Weeks in Treatment: 1 Compression Therapy Performed for Wound Assessment: Wound #6 Right,Medial Lower Leg Performed By: Clinician Deon Pilling, RN Compression Type: Four Layer Post Procedure Diagnosis Same as Pre-procedure Electronic Signature(s) Signed: 04/02/2022 5:03:28 PM By: [name], [name] Entered By: Deon Pilling on 04/02/2022  15:22:28 -------------------------------------------------------------------------------- Compression Therapy Details Patient Name: Date of Service: Carswell, [name] 04/02/2022 2:00 PM Medical Record Number: [id number] Patient Account Number: [id number] Hustead, [name] ([id number]) [id number] Page 2 of 8 Date of Birth/Sex: Treating RN: [birth year] (76 y.o. [name] Primary Care Teigen Bellin: Other Clinician: [name] Referring Rya Rausch: Treating Kwame Ryland/Extender: [name] Weeks in Treatment: 1 Compression Therapy Performed for Wound Assessment: Wound #7 Left,Medial Lower Leg Performed By: Clinician Deon Pilling, RN Compression Type: Four Layer Post Procedure Diagnosis Same as Pre-procedure Electronic Signature(s) Signed: 04/02/2022 5:03:28 PM By: [name], [name] Entered By: Deon Pilling on 04/02/2022 15:22:28 -------------------------------------------------------------------------------- Encounter Discharge Information Details Patient Name: Date of Service: [name], [name] 04/02/2022 2:00 PM Medical Record Number: [id number] Patient Account Number: [id number] Date of Birth/Sex: Treating RN: Jun 29, 1945 (75 y.o. [name] Primary Care Kalimah Capurro: [name] Other Clinician: Referring Zanyla Klebba: Treating Sheresa Cullop/Extender: [name] in Treatment: 1 Encounter Discharge Information Items Discharge Condition: Stable Ambulatory Status: Cane Discharge Destination: Home Transportation: Private Auto Accompanied By: self Schedule Follow-up Appointment: Yes Clinical Summary of Care: Electronic Signature(s) Signed: 04/02/2022 5:03:28 PM By: [name], [name] Entered By: Deon Pilling on 04/02/2022 15:26:44 -------------------------------------------------------------------------------- Lower Extremity Assessment Details Patient Name: Date of Service: [name], [name]  04/02/2022 2:00 PM Medical Record Number: [id number] Patient Account Number: [id number] Date of Birth/Sex: Treating RN: Jan 31, 1946 (76 y.o. M) Primary Care Darleen Moffitt: [name] Other Clinician: Referring Davison Ohms: Treating Estefan Pattison/Extender: [name], [name] Weeks in Treatment: 1 Edema Assessment Assessed: [Left: No] [Right: No] Edema: [Left: Yes] [Right: Yes] Calf Left: Right: Point of Measurement: 40 cm From Medial Instep 49.5 cm 59.1 cm Ankle Harts, Fredi ([id number]) [id number] Page 3 of 8 Left: Right: Point of Measurement: 13 cm From Medial Instep 41.5 cm 39.5 cm Electronic Signature(s) Signed: 04/02/2022 4:33:40 PM By: Erenest Blank Entered By: Erenest Blank on 04/02/2022 15:06:06 -------------------------------------------------------------------------------- Multi-Disciplinary Care Plan Details Patient Name: Date of Service: [name], [name] 04/02/2022 2:00 PM Medical Record Number: [id number]  Patient Account Number: [id number] Date of Birth/Sex: Treating RN: 1946/05/04 (76 y.o. [name] Primary Care Safire Gordin: [name] Other Clinician: Referring Deontrae Drinkard: Treating Fredna Stricker/Extender: [name] Weeks in Treatment: 1 Active Inactive Wound/Skin Impairment Nursing Diagnoses: Impaired tissue integrity Knowledge deficit related to ulceration/compromised skin integrity Goals: Patient will have a decrease in wound volume by X% from date: (specify in notes) Date Initiated: 03/26/2022 Target Resolution Date: 04/25/2022 Goal Status: Active Patient/caregiver will verbalize understanding of skin care regimen Date Initiated: 03/26/2022 Target Resolution Date: 04/25/2022 Goal Status: Active Ulcer/skin breakdown will have a volume reduction of 30% by week 4 Date Initiated: 03/26/2022 Target Resolution Date: 04/25/2022 Goal Status: Active Ulcer/skin breakdown will have a volume reduction of 50%  by week 8 Date Initiated: 03/26/2022 Target Resolution Date: 04/24/2022 Goal Status: Active Interventions: Assess patient/caregiver ability to obtain necessary supplies Assess patient/caregiver ability to perform ulcer/skin care regimen upon admission and as needed Assess ulceration(s) every visit Notes: Electronic Signature(s) Signed: 04/02/2022 5:03:28 PM By: [name], [name] Entered By: Deon Pilling on 04/02/2022 15:15:34 -------------------------------------------------------------------------------- Pain Assessment Details Patient Name: Date of Service: Kaufhold, Butterfield 04/02/2022 2:00 PM Medical Record Number: [id number] Patient Account Number: [id number] Date of Birth/Sex: Treating RN: 08-Aug-1945 (76 y.o. M) Primary Care Mekiyah Gladwell: [name] Other Clinician: Referring Everlena Mackley: Treating Najat Olazabal/Extender: [name], [name] Alemany, [name] ([id number]) [id number] Page 4 of 8 Weeks in Treatment: 1 Active Problems Location of Pain Severity and Description of Pain Patient Has Paino No Site Locations Pain Management and Medication Current Pain Management: Electronic Signature(s) Signed: 04/02/2022 4:33:40 PM By: Erenest Blank Entered By: Erenest Blank on 04/02/2022 14:31:05 -------------------------------------------------------------------------------- Patient/Caregiver Education Details Patient Name: Date of Service: Menard, [name] 11/15/2023andnbsp2:00 PM Medical Record Number: [id number] Patient Account Number: [id number] Date of Birth/Gender: Treating RN: 1945-10-25 (76 y.o. [name] Primary Care Physician: [name] Other Clinician: Referring Physician: Treating Physician/Extender: [name] in Treatment: 1 Education Assessment Education Provided To: Patient Education Topics Provided Wound/Skin Impairment: Handouts: Skin Care Do's and Dont's Methods:  Explain/Verbal Responses: Reinforcements needed Electronic Signature(s) Signed: 04/02/2022 5:03:28 PM By: [name], [name] Entered By: Deon Pilling on 04/02/2022 15:15:48 Delval, [name] ([id number]) [id number] Page 5 of 8 -------------------------------------------------------------------------------- Wound Assessment Details Patient Name: Date of Service: Ertle, [name] 04/02/2022 2:00 PM Medical Record Number: [id number] Patient Account Number: [id number] Date of Birth/Sex: Treating RN: 1945/08/10 (76 y.o. M) Primary Care Dorianne Perret: [name] Other Clinician: Referring Braiden Rodman: Treating Aissa Lisowski/Extender: [name], [name] Weeks in Treatment: 1 Wound Status Wound Number: 6 Primary Lymphedema Etiology: Wound Location: Right, Medial Lower Leg Wound Open Wounding Event: Gradually Appeared Status: Date Acquired: 03/05/2022 Comorbid Anemia, Lymphedema, Deep Vein Thrombosis, Hypertension, Weeks Of Treatment: 1 History: Received Radiation Clustered Wound: No Photos Wound Measurements Length: (cm) 8 Width: (cm) 5.5 Depth: (cm) 0.1 Area: (cm) 34.558 Volume: (cm) 3.456 % Reduction in Area: 75.8% % Reduction in Volume: 75.8% Epithelialization: None Tunneling: No Undermining: No Wound Description Classification: Full Thickness Without Exposed Support Wound Margin: Distinct, outline attached Exudate Amount: Large Exudate Type: Purulent Exudate Color: yellow, brown, green Structures Foul Odor After Cleansing: Yes Due to Product Use: No Slough/Fibrino Yes Wound Bed Granulation Amount: None Present (0%) Exposed Structure Necrotic Amount: Large (67-100%) Fascia Exposed: No Necrotic Quality: Adherent Slough Fat Layer (Subcutaneous Tissue) Exposed: Yes Tendon Exposed: No Muscle Exposed: No Joint Exposed: No Bone Exposed: No Periwound Skin Texture Texture Color  No Abnormalities Noted: No No Abnormalities Noted:  No Callus: No Atrophie Blanche: No Crepitus: No Cyanosis: No Excoriation: No Ecchymosis: No Induration: No Erythema: No Rash: No Hemosiderin Staining: Yes Scarring: No Mottled: No Pallor: No Moisture Rubor: No No Abnormalities Noted: No Dry / Scaly: No Maceration: No Beaumier, Daxtyn ([id number]) [id number] Page 6 of 8 Treatment Notes Wound #6 (Lower Leg) Wound Laterality: Right, Medial Cleanser Soap and Water Discharge Instruction: May shower and wash wound with dial antibacterial soap and water prior to dressing change. Peri-Wound Care Sween Lotion (Moisturizing lotion) Discharge Instruction: Apply moisturizing lotion as directed Topical Primary Dressing KerraCel Ag Gelling Fiber Dressing, 4x5 in (silver alginate) Discharge Instruction: Apply silver alginate to wound bed as instructed Secondary Dressing ABD Pad, 5x9 Discharge Instruction: Apply over primary dressing as directed. Woven Gauze Sponge, Non-Sterile 4x4 in Discharge Instruction: Apply over primary dressing as directed. Secured With Compression Wrap FourPress (4 layer compression wrap) Discharge Instruction: Apply four layer compression as directed. May also use Miliken CoFlex 2 layer compression system as alternative. Compression Stockings Add-Ons Electronic Signature(s) Signed: 04/02/2022 4:33:40 PM By: Erenest Blank Entered By: Erenest Blank on 04/02/2022 15:00:12 -------------------------------------------------------------------------------- Wound Assessment Details Patient Name: Date of Service: Vukelich, [name] 04/02/2022 2:00 PM Medical Record Number: [id number] Patient Account Number: [id number] Date of Birth/Sex: Treating RN: 02-14-1946 (76 y.o. M) Primary Care Zanayah Shadowens: [name] Other Clinician: Referring Erasmus Bistline: Treating Sanari Offner/Extender: [name], [name] Weeks in Treatment: 1 Wound Status Wound Number: 7 Primary  Lymphedema Etiology: Wound Location: Left, Medial Lower Leg Wound Open Wounding Event: Gradually Appeared Status: Date Acquired: 03/05/2022 Comorbid Anemia, Lymphedema, Deep Vein Thrombosis, Hypertension, Weeks Of Treatment: 1 History: Received Radiation Clustered Wound: No Photos Mcquaig, [name] ([id number]) [id number] Page 7 of 8 Wound Measurements Length: (cm) 2.5 Width: (cm) 1.5 Depth: (cm) 0.1 Area: (cm) 2.945 Volume: (cm) 0.295 % Reduction in Area: 93.8% % Reduction in Volume: 93.7% Epithelialization: None Tunneling: No Undermining: No Wound Description Classification: Full Thickness Without Exposed Suppor Wound Margin: Distinct, outline attached Exudate Amount: Large Exudate Type: Serosanguineous Exudate Color: red, brown t Structures Foul Odor After Cleansing: No Slough/Fibrino Yes Wound Bed Granulation Amount: Medium (34-66%) Exposed Structure Granulation Quality: Pink, Pale Fascia Exposed: No Necrotic Amount: Medium (34-66%) Fat Layer (Subcutaneous Tissue) Exposed: Yes Necrotic Quality: Adherent Slough Tendon Exposed: No Muscle Exposed: No Joint Exposed: No Bone Exposed: No Periwound Skin Texture Texture Color No Abnormalities Noted: No No Abnormalities Noted: No Callus: No Atrophie Blanche: No Crepitus: No Cyanosis: No Excoriation: No Ecchymosis: No Induration: No Erythema: No Rash: No Hemosiderin Staining: Yes Scarring: No Mottled: No Pallor: No Moisture Rubor: No No Abnormalities Noted: No Dry / Scaly: No Maceration: No Treatment Notes Wound #7 (Lower Leg) Wound Laterality: Left, Medial Cleanser Soap and Water Discharge Instruction: May shower and wash wound with dial antibacterial soap and water prior to dressing change. Peri-Wound Care Sween Lotion (Moisturizing lotion) Discharge Instruction: Apply moisturizing lotion as directed Topical Primary Dressing KerraCel Ag Gelling Fiber Dressing, 4x5 in  (silver alginate) Discharge Instruction: Apply silver alginate to wound bed as instructed Secondary Dressing ABD Pad, 5x9 Discharge Instruction: Apply over primary dressing as directed. Woven Gauze Sponge, Non-Sterile 4x4 in Discharge Instruction: Apply over primary dressing as directed. Donath, [name] ([id number]) [id number] Page 8 of 8 Secured With Compression Wrap FourPress (4 layer compression wrap) Discharge Instruction: Apply four layer compression as directed. May also use Miliken CoFlex 2 layer compression system as alternative. Compression Stockings Add-Ons Electronic Signature(s) Signed: 04/02/2022  4:33:40 PM By: Erenest Blank Entered By: Erenest Blank on 04/02/2022 15:01:09 -------------------------------------------------------------------------------- Vitals Details Patient Name: Date of Service: Riveron, [name] 04/02/2022 2:00 PM Medical Record Number: [id number] Patient Account Number: [id number] Date of Birth/Sex: Treating RN: 1945-08-18 (76 y.o. M) Primary Care Milissa Fesperman: [name] Other Clinician: Referring Jianni Batten: Treating Miracle Criado/Extender: [name], [name] Weeks in Treatment: 1 Vital Signs Time Taken: 14:30 Temperature (F): 97.7 Height (in): 74 Pulse (bpm): 98 Weight (lbs): 250 Respiratory Rate (breaths/min): 18 Body Mass Index (BMI): 32.1 Blood Pressure (mmHg): 178/76 Reference Range: 80 - 120 mg / dl Electronic Signature(s) Signed: 04/02/2022 4:33:40 PM By: Erenest Blank Entered By: Erenest Blank on 04/02/2022 14:30:57

## 2022-04-09 ENCOUNTER — Encounter (HOSPITAL_BASED_OUTPATIENT_CLINIC_OR_DEPARTMENT_OTHER): Payer: Medicare Other | Admitting: Physician Assistant

## 2022-04-09 DIAGNOSIS — L97828 Non-pressure chronic ulcer of other part of left lower leg with other specified severity: Secondary | ICD-10-CM | POA: Diagnosis not present

## 2022-04-10 NOTE — Progress Notes (Signed)
Ruperto, [name] ([id number]) [id number] Page 1 of 5 Visit Report for 04/09/2022 Arrival Information Details Patient Name: Date of Service: [name], [name] 04/09/2022 11:30 A M Medical Record Number: [id number] Patient Account Number: [id number] Date of Birth/Sex: Treating RN: 10/21/1945 (76 y.o. M) Primary Care Alta Shober: [name] Other Clinician: Referring Shacora Zynda: Treating Kaitrin Seybold/Extender: [name] in Treatment: 2 Visit Information History Since Last Visit Added or deleted any medications: No Patient Arrived: Cane Any new allergies or adverse reactions: No Arrival Time: 11:37 Had a fall or experienced change in No Accompanied By: self activities of daily living that may affect Transfer Assistance: None risk of falls: Patient Identification Verified: Yes Signs or symptoms of abuse/neglect since last visito No Secondary Verification Process Completed: Yes Hospitalized since last visit: No Patient Requires Transmission-Based Precautions: No Implantable device outside of the clinic excluding No Patient Has Alerts: Yes cellular tissue based products placed in the center Patient Alerts: Patient on Blood Thinner since last visit: Right ABI in clinic [place] Has Compression in Place as Prescribed: Yes Pain Present Now: No Electronic Signature(s) Signed: 04/09/2022 4:51:51 PM By: Erenest Blank Entered By: Erenest Blank on 04/09/2022 11:38:23 -------------------------------------------------------------------------------- Compression Therapy Details Patient Name: Date of Service: [name], [name] 04/09/2022 11:30 A M Medical Record Number: [id number] Patient Account Number: [id number] Date of Birth/Sex: Treating RN: 22-Feb-1946 (76 y.o. M) Primary Care Muriel Wilber: [name] Other Clinician: Referring Jujhar Everett: Treating Mery Guadalupe/Extender: [name], [name] Weeks in Treatment: 2 Compression Therapy  Performed for Wound Assessment: Wound #6 Right,Medial Lower Leg Performed By: Clinician Erenest Blank, Compression Type: Four Layer Electronic Signature(s) Signed: 04/09/2022 4:51:51 PM By: Erenest Blank Entered By: Erenest Blank on 04/09/2022 12:42:25 -------------------------------------------------------------------------------- Compression Therapy Details Patient Name: Date of Service: [name], [name] 04/09/2022 11:30 A M Medical Record Number: [id number] Patient Account Number: [id number] Date of Birth/Sex: Treating RN: 10-06-1945 (76 y.o. M) Rubens, [name] ([id number]) [id number] Page 2 of 5 Primary Care Kameisha Malicki: [name] Other Clinician: Referring Tammra Pressman: Treating Jencarlo Bonadonna/Extender: [name], [name] Weeks in Treatment: 2 Compression Therapy Performed for Wound Assessment: Wound #7 Left,Medial Lower Leg Performed By: Clinician Erenest Blank, Compression Type: Four Layer Electronic Signature(s) Signed: 04/09/2022 4:51:51 PM By: Erenest Blank Entered By: Erenest Blank on 04/09/2022 12:42:25 -------------------------------------------------------------------------------- Encounter Discharge Information Details Patient Name: Date of Service: [name], [name] 04/09/2022 11:30 A M Medical Record Number: [id number] Patient Account Number: [id number] Date of Birth/Sex: Treating RN: [birth year] (76 y.o. M) Primary Care Janete Quilling: [name] Other Clinician: Referring Znya Albino: Treating Miya Luviano/Extender: [name], Benjaman Kindler in Treatment: 2 Encounter Discharge Information Items Discharge Condition: Stable Ambulatory Status: Cane Discharge Destination: Home Transportation: Private Auto Accompanied By: self Schedule Follow-up Appointment: Yes Clinical Summary of Care: Electronic Signature(s) Signed: 04/09/2022 4:51:51 PM By: Erenest Blank Entered By: Erenest Blank on 04/09/2022  12:44:22 -------------------------------------------------------------------------------- Patient/Caregiver Education Details Patient Name: Date of Service: [name], [name] 11/22/2023andnbsp11:30 A M Medical Record Number: [id number] Patient Account Number: [id number] Date of Birth/Gender: Treating RN: Apr 11, 1946 (76 y.o. M) Primary Care Physician: [name] Other Clinician: Erenest Blank Referring Physician: Treating Physician/Extender: [name] in Treatment: 2 Education Assessment Education Provided To: Patient Education Topics Provided Electronic Signature(s) Signed: 04/09/2022 4:51:51 PM By: Erenest Blank Entered By: Erenest Blank on 04/09/2022 12:44:01 [name], [name] ([id number]) [id number] Page 3 of 5 -------------------------------------------------------------------------------- Wound Assessment Details Patient Name: Date of Service: [name], [name] 04/09/2022 11:30 A M  Medical Record Number: [id number] Patient Account Number: [id number] Date of Birth/Sex: Treating RN: [birth year] (76 y.o. M) Primary Care Naoki Migliaccio: [name] Other Clinician: Referring Kian Gamarra: Treating Elford Evilsizer/Extender: [name], [name] Weeks in Treatment: 2 Wound Status Wound Number: 6 Primary Etiology: Lymphedema Wound Location: Right, Medial Lower Leg Wound Status: Open Wounding Event: Gradually Appeared Date Acquired: 03/05/2022 Weeks Of Treatment: 2 Clustered Wound: No Wound Measurements Length: (cm) 8 Width: (cm) 5.5 Depth: (cm) 0.1 Area: (cm) 34.558 Volume: (cm) 3.456 % Reduction in Area: 75.8% % Reduction in Volume: 75.8% Wound Description Classification: Full Thickness Without Exposed Suppor Exudate Amount: Large Exudate Type: Purulent Exudate Color: yellow, brown, green t Structures Periwound Skin Texture Texture Color No Abnormalities Noted: No No Abnormalities Noted: No Moisture No  Abnormalities Noted: No Treatment Notes Wound #6 (Lower Leg) Wound Laterality: Right, Medial Cleanser Soap and Water Discharge Instruction: May shower and wash wound with dial antibacterial soap and water prior to dressing change. Peri-Wound Care Sween Lotion (Moisturizing lotion) Discharge Instruction: Apply moisturizing lotion as directed Topical Primary Dressing KerraCel Ag Gelling Fiber Dressing, 4x5 in (silver alginate) Discharge Instruction: Apply silver alginate to wound bed as instructed Secondary Dressing ABD Pad, 5x9 Discharge Instruction: Apply over primary dressing as directed. Woven Gauze Sponge, Non-Sterile 4x4 in Discharge Instruction: Apply over primary dressing as directed. Secured With Compression Wrap FourPress (4 layer compression wrap) Discharge Instruction: Apply four layer compression as directed. May also use Miliken CoFlex 2 layer compression system as alternative. Compression Stockings [name], [name] ([id number]) [id number] Page 4 of 5 Add-Ons Electronic Signature(s) Signed: 04/09/2022 4:51:51 PM By: Erenest Blank Entered By: Erenest Blank on 04/09/2022 11:59:27 -------------------------------------------------------------------------------- Wound Assessment Details Patient Name: Date of Service: [name], [name] 04/09/2022 11:30 A M Medical Record Number: [id number] Patient Account Number: [id number] Date of Birth/Sex: Treating RN: [birth year] (76 y.o. M) Primary Care Woody Kronberg: [name] Other Clinician: Referring Camira Geidel: Treating Aldahir Litaker/Extender: [name], [name] Weeks in Treatment: 2 Wound Status Wound Number: 7 Primary Etiology: Lymphedema Wound Location: Left, Medial Lower Leg Wound Status: Open Wounding Event: Gradually Appeared Date Acquired: 03/05/2022 Weeks Of Treatment: 2 Clustered Wound: No Wound Measurements Length: (cm) 2.5 Width: (cm) 1.5 Depth: (cm) 0.1 Area: (cm)  2.945 Volume: (cm) 0.295 % Reduction in Area: 93.8% % Reduction in Volume: 93.7% Wound Description Classification: Full Thickness Without Exposed Suppor Exudate Amount: Large Exudate Type: Serosanguineous Exudate Color: red, brown t Structures Periwound Skin Texture Texture Color No Abnormalities Noted: No No Abnormalities Noted: No Moisture No Abnormalities Noted: No Treatment Notes Wound #7 (Lower Leg) Wound Laterality: Left, Medial Cleanser Soap and Water Discharge Instruction: May shower and wash wound with dial antibacterial soap and water prior to dressing change. Peri-Wound Care Sween Lotion (Moisturizing lotion) Discharge Instruction: Apply moisturizing lotion as directed Topical Primary Dressing KerraCel Ag Gelling Fiber Dressing, 4x5 in (silver alginate) Discharge Instruction: Apply silver alginate to wound bed as instructed Secondary Dressing ABD Pad, 5x9 Discharge Instruction: Apply over primary dressing as directed. Woven Gauze Sponge, Non-Sterile 4x4 in Discharge Instruction: Apply over primary dressing as directed. [name], [name] ([id number]) [id number] Page 5 of 5 Secured With Compression Wrap FourPress (4 layer compression wrap) Discharge Instruction: Apply four layer compression as directed. May also use Miliken CoFlex 2 layer compression system as alternative. Compression Stockings Add-Ons Electronic Signature(s) Signed: 04/09/2022 4:51:51 PM By: Erenest Blank Entered By: Erenest Blank on 04/09/2022 11:59:27 -------------------------------------------------------------------------------- Vitals Details Patient Name: Date of Service: [name], [name] 04/09/2022 11:30 A M  Medical Record Number: [id number] Patient Account Number: [id number] Date of Birth/Sex: Treating RN: 06-28-1945 (76 y.o. M) Primary Care Ashaya Raftery: [name] Other Clinician: Referring Draeden Kellman: Treating Erikson Danzy/Extender: [name], [name] Weeks in Treatment: 2 Vital Signs Time Taken: 11:58 Temperature (F): 97.6 Height (in): 74 Pulse (bpm): 92 Weight (lbs): 250 Respiratory Rate (breaths/min): 18 Body Mass Index (BMI): 32.1 Blood Pressure (mmHg): 170/55 Reference Range: 80 - 120 mg / dl Electronic Signature(s) Signed: 04/09/2022 4:51:51 PM By: Erenest Blank Entered By: Erenest Blank on 04/09/2022 11:58:45

## 2022-04-10 NOTE — Progress Notes (Signed)
Hallstrom, [name] ([id number]) [id number] Page 1 of 1 Visit Report for 04/09/2022 SuperBill Details Patient Name: Date of Service: [name], [name] 04/09/2022 Medical Record Number: [id number] Patient Account Number: [id number] Date of Birth/Sex: Treating RN: [birth year] (76 y.o. 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 Electronic Signature(s) Signed: 04/09/2022 2:30:20 PM By: Worthy Keeler PA-C Signed: 04/09/2022 4:51:51 PM By: Erenest Blank Entered By: Erenest Blank on 04/09/2022 12:45:16

## 2022-04-16 ENCOUNTER — Encounter (HOSPITAL_BASED_OUTPATIENT_CLINIC_OR_DEPARTMENT_OTHER): Payer: Medicare Other | Admitting: General Surgery

## 2022-04-16 DIAGNOSIS — L97828 Non-pressure chronic ulcer of other part of left lower leg with other specified severity: Secondary | ICD-10-CM | POA: Diagnosis not present

## 2022-04-16 NOTE — Progress Notes (Signed)
[name], [name] ([id number]) [id number] Page 1 of 1 Visit Report for 04/16/2022 SuperBill Details Patient Name: Date of Service: [name], [name] 04/16/2022 Medical Record Number: [id number] Patient Account Number: [id number] Date of Birth/Sex: Treating RN: 1946/02/23 (76 y.o. 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 Electronic Signature(s) Signed: 04/16/2022 4:23:54 PM By: Rhae Hammock RN Signed: 04/16/2022 4:27:34 PM By: Fredirick Maudlin MD FACS Entered By: Rhae Hammock on 04/16/2022 16:21:54

## 2022-04-16 NOTE — Progress Notes (Signed)
Stcharles, [name] ([id number]) [id number] Page 1 of 5 Visit Report for 04/16/2022 Arrival Information Details Patient Name: Date of Service: [name], [name] 04/16/2022 2:15 PM Medical Record Number: [id number] Patient Account Number: [id number] Date of Birth/Sex: Treating RN: December 09, 1945 (76 y.o. [name], [name] Primary Care November Sypher: [name] Other Clinician: Referring Luka Stohr: Treating Zennie Ayars/Extender: Barnett Applebaum Weeks in Treatment: 3 Visit Information History Since Last Visit Added or deleted any medications: No Patient Arrived: Ambulatory Any new allergies or adverse reactions: No Arrival Time: 16:20 Had a fall or experienced change in No Accompanied By: wife activities of daily living that may affect Transfer Assistance: None risk of falls: Patient Identification Verified: Yes Signs or symptoms of abuse/neglect since last visito No Secondary Verification Process Completed: Yes Hospitalized since last visit: No Patient Requires Transmission-Based Precautions: No Implantable device outside of the clinic excluding No Patient Has Alerts: Yes cellular tissue based products placed in the center Patient Alerts: Patient on Blood Thinner since last visit: Right ABI in clinic [place] Has Dressing in Place as Prescribed: Yes Has Compression in Place as Prescribed: Yes Pain Present Now: No Electronic Signature(s) Signed: 04/16/2022 4:23:54 PM By: Rhae Hammock RN Entered By: Rhae Hammock on 04/16/2022 16:20:15 -------------------------------------------------------------------------------- Compression Therapy Details Patient Name: Date of Service: [name], [name] 04/16/2022 2:15 PM Medical Record Number: [id number] Patient Account Number: [id number] Date of Birth/Sex: Treating RN: Oct 14, 1945 (76 y.o. [name], [name] Primary Care Bailey Faiella: [name] Other Clinician: Referring Keegen Heffern: Treating  Sonny Anthes/Extender: Barnett Applebaum Weeks in Treatment: 3 Compression Therapy Performed for Wound Assessment: Wound #6 Right,Medial Lower Leg Performed By: Clinician Rhae Hammock, RN Compression Type: Four Layer Electronic Signature(s) Signed: 04/16/2022 4:23:54 PM By: Rhae Hammock RN Entered By: Rhae Hammock on 04/16/2022 16:21:10 -------------------------------------------------------------------------------- Compression Therapy Details Patient Name: Date of Service: [name], [name] 04/16/2022 2:15 PM Medical Record Number: [id number] Patient Account Number: [id number] [name], [name] ([id number]) [id number] Page 2 of 5 Date of Birth/Sex: Treating RN: [birth year] (76 y.o. [name] Primary Care Merisa Julio: Other Clinician: [name] Referring Jovon Winterhalter: Treating Shawnte Demarest/Extender: Barnett Applebaum Weeks in Treatment: 3 Compression Therapy Performed for Wound Assessment: Wound #7 Left,Medial Lower Leg Performed By: Clinician Rhae Hammock, RN Compression Type: Four Layer Electronic Signature(s) Signed: 04/16/2022 4:23:54 PM By: Rhae Hammock RN Entered By: Rhae Hammock on 04/16/2022 16:21:10 -------------------------------------------------------------------------------- Encounter Discharge Information Details Patient Name: Date of Service: [name], [name] 04/16/2022 2:15 PM Medical Record Number: [id number] Patient Account Number: [id number] Date of Birth/Sex: Treating RN: Jan 19, 1946 (76 y.o. [name], [name] Primary Care Tavia Stave: [name] Other Clinician: Referring Antrone Walla: Treating Medford Staheli/Extender: Particia Nearing in Treatment: 3 Encounter Discharge Information Items Discharge Condition: Stable Ambulatory Status: Ambulatory Discharge Destination: Home Transportation: Private Auto Accompanied By: wife Schedule Follow-up Appointment:  Yes Clinical Summary of Care: Patient Declined Electronic Signature(s) Signed: 04/16/2022 4:23:54 PM By: Rhae Hammock RN Entered By: Rhae Hammock on 04/16/2022 16:21:46 -------------------------------------------------------------------------------- Patient/Caregiver Education Details Patient Name: Date of Service: [name], [name] 11/29/2023andnbsp2:15 PM Medical Record Number: [id number] Patient Account Number: [id number] Date of Birth/Gender: Treating RN: [birth year] (76 y.o. [name] Primary Care Physician: [name] Other Clinician: Referring Physician: Treating Physician/Extender: Particia Nearing in Treatment: 3 Education Assessment Education Provided To: Patient Education Topics Provided Basic Hygiene: Methods: Explain/Verbal Responses: Reinforcements needed, State content correctly Wound Debridement: Methods: Explain/Verbal Responses: Reinforcements needed, State content correctly [name], [name] ([id number]) [id number] Page 3 of  5 Wound/Skin Impairment: Methods: Explain/Verbal Responses: Reinforcements needed, State content correctly Electronic Signature(s) Signed: 04/16/2022 4:23:54 PM By: Rhae Hammock RN Entered By: Rhae Hammock on 04/16/2022 16:21:36 -------------------------------------------------------------------------------- Wound Assessment Details Patient Name: Date of Service: [name], [name] 04/16/2022 2:15 PM Medical Record Number: [id number] Patient Account Number: [id number] Date of Birth/Sex: Treating RN: [birth year] (76 y.o. [name], [name] Primary Care Zakayla Martinec: [name] Other Clinician: Referring Javaris Wigington: Treating Shandy Vi/Extender: Barnett Applebaum Weeks in Treatment: 3 Wound Status Wound Number: 6 Primary Etiology: Lymphedema Wound Location: Right, Medial Lower Leg Wound Status: Open Wounding Event: Gradually Appeared Date  Acquired: 03/05/2022 Weeks Of Treatment: 3 Clustered Wound: No Wound Measurements Length: (cm) 8 Width: (cm) 5.5 Depth: (cm) 0.1 Area: (cm) 34.558 Volume: (cm) 3.456 % Reduction in Area: 75.8% % Reduction in Volume: 75.8% Wound Description Classification: Full Thickness Without Exposed Suppor Exudate Amount: Large Exudate Type: Purulent Exudate Color: yellow, brown, green t Structures Periwound Skin Texture Texture Color No Abnormalities Noted: No No Abnormalities Noted: No Moisture No Abnormalities Noted: No Treatment Notes Wound #6 (Lower Leg) Wound Laterality: Right, Medial Cleanser Soap and Water Discharge Instruction: May shower and wash wound with dial antibacterial soap and water prior to dressing change. Peri-Wound Care Sween Lotion (Moisturizing lotion) Discharge Instruction: Apply moisturizing lotion as directed Topical Primary Dressing KerraCel Ag Gelling Fiber Dressing, 4x5 in (silver alginate) Discharge Instruction: Apply silver alginate to wound bed as instructed Secondary Dressing Harms, [name] ([id number]) [id number] Page 4 of 5 ABD Pad, 5x9 Discharge Instruction: Apply over primary dressing as directed. Woven Gauze Sponge, Non-Sterile 4x4 in Discharge Instruction: Apply over primary dressing as directed. Secured With Compression Wrap FourPress (4 layer compression wrap) Discharge Instruction: Apply four layer compression as directed. May also use Miliken CoFlex 2 layer compression system as alternative. Compression Stockings Add-Ons Electronic Signature(s) Signed: 04/16/2022 4:23:54 PM By: Rhae Hammock RN Entered By: Rhae Hammock on 04/16/2022 16:20:57 -------------------------------------------------------------------------------- Wound Assessment Details Patient Name: Date of Service: [name], [name] 04/16/2022 2:15 PM Medical Record Number: [id number] Patient Account Number: [id number] Date of  Birth/Sex: Treating RN: 08/15/1945 (76 y.o. [name], [name] Primary Care Jemery Stacey: [name] Other Clinician: Referring Juel Ripley: Treating Josephine Wooldridge/Extender: Barnett Applebaum Weeks in Treatment: 3 Wound Status Wound Number: 7 Primary Etiology: Lymphedema Wound Location: Left, Medial Lower Leg Wound Status: Open Wounding Event: Gradually Appeared Date Acquired: 03/05/2022 Weeks Of Treatment: 3 Clustered Wound: No Wound Measurements Length: (cm) 2.5 Width: (cm) 1.5 Depth: (cm) 0.1 Area: (cm) 2.945 Volume: (cm) 0.295 % Reduction in Area: 93.8% % Reduction in Volume: 93.7% Wound Description Classification: Full Thickness Without Exposed Suppor Exudate Amount: Large Exudate Type: Serosanguineous Exudate Color: [name], brown t Structures Periwound Skin Texture Texture Color No Abnormalities Noted: No No Abnormalities Noted: No Moisture No Abnormalities Noted: No Treatment Notes Wound #7 (Lower Leg) Wound Laterality: Left, Medial Cleanser Soap and Water Discharge Instruction: May shower and wash wound with dial antibacterial soap and water prior to dressing change. Peri-Wound Care Sween Lotion (Moisturizing lotion) Discharge Instruction: Apply moisturizing lotion as directed Ceesay, [name] ([id number]) [id number] Page 5 of 5 Topical Primary Dressing KerraCel Ag Gelling Fiber Dressing, 4x5 in (silver alginate) Discharge Instruction: Apply silver alginate to wound bed as instructed Secondary Dressing ABD Pad, 5x9 Discharge Instruction: Apply over primary dressing as directed. Woven Gauze Sponge, Non-Sterile 4x4 in Discharge Instruction: Apply over primary dressing as directed. Secured With Compression Wrap FourPress (4 layer compression wrap) Discharge Instruction: Apply four layer compression as directed.  May also use Miliken CoFlex 2 layer compression system as alternative. Compression  Stockings Add-Ons Electronic Signature(s) Signed: 04/16/2022 4:23:54 PM By: Rhae Hammock RN Entered By: Rhae Hammock on 04/16/2022 16:20:57 -------------------------------------------------------------------------------- Vitals Details Patient Name: Date of Service: [place], [place] 04/16/2022 2:15 PM Medical Record Number: [id number] Patient Account Number: [id number] Date of Birth/Sex: Treating RN: 06/16/1945 (76 y.o. [name], [name] Primary Care Delfina Schreurs: [name] Other Clinician: Referring Louann Hopson: Treating Verl Kitson/Extender: Barnett Applebaum Weeks in Treatment: 3 Vital Signs Time Taken: 16:20 Temperature (F): 98.7 Height (in): 74 Pulse (bpm): 90 Weight (lbs): 250 Respiratory Rate (breaths/min): 17 Body Mass Index (BMI): 32.1 Blood Pressure (mmHg): 120/70 Reference Range: 80 - 120 mg / dl Electronic Signature(s) Signed: 04/16/2022 4:23:54 PM By: Rhae Hammock RN Entered By: Rhae Hammock on 04/16/2022 16:20:44

## 2022-04-23 ENCOUNTER — Encounter (HOSPITAL_BASED_OUTPATIENT_CLINIC_OR_DEPARTMENT_OTHER): Payer: Medicare Other | Attending: Physician Assistant | Admitting: Physician Assistant

## 2022-04-23 DIAGNOSIS — Z8546 Personal history of malignant neoplasm of prostate: Secondary | ICD-10-CM | POA: Insufficient documentation

## 2022-04-23 DIAGNOSIS — L97828 Non-pressure chronic ulcer of other part of left lower leg with other specified severity: Secondary | ICD-10-CM | POA: Diagnosis present

## 2022-04-23 DIAGNOSIS — Z923 Personal history of irradiation: Secondary | ICD-10-CM | POA: Diagnosis not present

## 2022-04-23 DIAGNOSIS — I129 Hypertensive chronic kidney disease with stage 1 through stage 4 chronic kidney disease, or unspecified chronic kidney disease: Secondary | ICD-10-CM | POA: Diagnosis not present

## 2022-04-23 DIAGNOSIS — L97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 other part of right lower leg with other specified severity: Secondary | ICD-10-CM | POA: Insufficient documentation

## 2022-04-23 DIAGNOSIS — N1832 Chronic kidney disease, stage 3b: Secondary | ICD-10-CM | POA: Insufficient documentation

## 2022-04-23 DIAGNOSIS — I890 Lymphedema, not elsewhere classified: Secondary | ICD-10-CM | POA: Diagnosis not present

## 2022-04-23 NOTE — Progress Notes (Addendum)
Dentinger, [name] ([id number]) [id number] Page 1 of 8 Visit Report for 04/23/2022 Chief Complaint Document Details Patient Name: Date of Service: [name], [name] 04/23/2022 11:30 A M Medical Record Number: [id number] Patient Account Number: [id number] Date of Birth/Sex: Treating RN: 1946/01/13 (76 y.o. M) Primary Care Provider: [name] Other Clinician: Referring Provider: Treating Provider/Extender: [name] Weeks in Treatment: 4 Information Obtained from: Patient Chief Complaint 02/23/2020; patient is here for wounds on his bilateral lower legs in the setting of severe lymphedema 03/26/2022; patient is here for wounds on his bilateral lower legs medial aspect Electronic Signature(s) Signed: 04/23/2022 11:36:44 AM By: Worthy Keeler PA-C Entered By: Worthy Keeler on 04/23/2022 11:36:44 -------------------------------------------------------------------------------- HPI Details Patient Name: Date of Service: [name], [name] 04/23/2022 11:30 A M Medical Record Number: [id number] Patient Account Number: [id number] Date of Birth/Sex: Treating RN: [birth year] (76 y.o. M) Primary Care Provider: [name] Other Clinician: Referring Provider: Treating Provider/Extender: [name] Weeks in Treatment: 4 History of Present Illness HPI Description: ADMISSION 02/23/2020 Patient is a 76 year old man who lives in [place] who arrives accompanied by his wife. He has a history of chronic lymphedema and venous insufficiency in his bilateral lower legs which may have something to do that with having a history of DVT as well as being treated for prostate cancer. In any case he recently got compression pumps at home but compliance has been an issue here. He has compression stockings however they are probably not sufficient enough to control swelling. They tell us that things deteriorated for him in late August he was  admitted to [facility] for 7 days. This was with cellulitis I think of his bilateral lower legs. Discharge he was noted to have wounds on his bilateral lower legs. He was discharged on Bactrim. They tried to get him home health through [facility] of course they declined him. His wife is been wrapping these applying some form of silver foam dressing. He has a history of wounds before although nothing that would not heal with basic home topical dressings. He has 2 areas on the left medial, left anterior and left lateral and a smaller area on the right medial. All of these have considerable depth. Past medical history includes iron deficiency anemia, lymphedema followed by the rehab center at [facility] with lymphedema wraps I believe, DVT on chronic anticoagulation, prostate cancer, chronic venous insufficiency, hypertension. As mentioned he has compression pumps but does not use them. ABIs in our clinic were noncompressible bilaterally 10/14; patient with severe bilateral lymphedema right greater than left. He came in with bilateral lower extremity wounds left greater than right. Even though the right side has more of the edema most of the wounds here almost closed on the right medial. He has 3 remaining wounds on the left We have been using silver alginate under 4-layer compression I have been trying to get him to be compliant with his external compression pumps 10/21; patient with 3 small wounds on the left leg and 1 on the right medial in the setting of severe lymphedema and chronic venous insufficiency. We have been using silver alginate under 4-layer compression he is using his external compression pumps twice a day 11/4; ARTERIAL STUDIES on the right show an ABI of 1.02 TBI of 0.858 with biphasic waveforms on the left 0.98 with a TBI of 0.55 and biphasic waveforms. Does not look like he has significant arterial disease. We  are treating him for lymphedema he has compression  pumps. He has punched-out areas on the left anterior left lateral and right medial lower extremities 11/11; after we obtained his arterial studies I put him in 4 layer compression. He is using his compression pumps probably once a day although I have asked [place], [name] ([id number]) [id number] Page 2 of 8 him to do twice. Primary dressing to the wound is silver collagen he has severe lymphedema likely secondary to chronic venous insufficiency. Wounds on the left lateral, left medial and left anterior and a small area on the right medial 12/2; the area on the right anterior lower leg has healed. We initially thought that the area medially had healed as well however when her discharge nurse came in she detected fluid in the wound simply opened up. This is actually worse than I remember this pain. The area on the left lateral potentially slightly smaller He is also complaining about pain in his left hand he says that this is actually been getting some better he has been using topical creams on this. She asked that I look at this 12/9 after last weeks issues we have 2 wounds one on the right medial lower leg and 1 on the left lateral. Both of these are in the same condition. I think because of thickened skin secondary to chronic lymphedema these wounds actually have depth of almost 0.8 cm. 12/16; the patient has 2 small but deep wounds one on the right medial and one on the left lateral. The right medial is actually the worst of these. He arrives in clinic today with absolutely terrible edema in the right leg apparently his 4-layer wrap fell down to just above his ankle he did not think about this he is apparently been continuing to use his compression pump twice a day. The left leg looks a lot better. 05/09/2020 upon evaluation today patient appears to be doing decently well in regard to his wounds. Everything is measuring smaller the right leg still has a little bit deeper  wound in the left seems to be almost completely healed in my opinion I am very pleased in general with how things are progressing. He has a 4- layer compression wrap we have been using endoform today we will probably have to use collagen just based on the fact that we do not have endoform it is on order. 1/6; the patient's wound on the left lateral lower leg has healed. Still has 1 on the right medial. 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 Using compression pumps at home twice a day. 1/13; left lateral lower leg is still healed. He has a deep punched out rectangular shaped wound on the right medial calf. Looking down at this it appears that he is attempting to epithelialize around the edges of the wound and on the base as well. His edema is reasonably well controlled we have been using collagen with absolutely no effect 1/20; left lateral lower leg remains closed he has extremitease stockings. The area on the right medial calf I aggressively debrided last week measures larger but the surface looks better. We have been using Hydrofera Blue. We ran Oasis through his insurance but we have not seen the results of this 1/27; left lower leg wound with chronic venous insufficiency and secondary lymphedema. I did aggressive debridement on this last week the wound seems to have come in healthy looking surface using Hydrofera Blue. He was denied for Oasis 2/3; small divot in the  right medial lower leg. Under illumination the walls of this divot are epithelialized however the base has slough which I removed with a curette we have been using Hydrofera Blue 2/10 small divot on the right medial lower leg pinpoint illumination at the base of this cone-shaped wound. We have been using Hydrofera Blue but I will switch to calcium alginate this week 2/17; the small divot on the right medial lower leg is fully epithelialized. There is no visible open area under illumination. He has his own  stocking for the right leg similar to the one he has been wearing on the left. 03/26/2022; READMISSION This is a now 76 year old man that we had in the clinic from 02/23/2020 through 07/05/2020. At that point he had bilateral lower extremity wounds left greater than right in the setting of severe lymphedema. He had already obtained compression pumps ordered for him I think from the wound care clinic in [facility] so I do not really have record of what he has been using. He claims to be using them once a day but there is a problem with the sleeve on the left leg. About 2 weeks ago he was hospitalized from 03/11/2022 through 78/93/8101 with diastolic congestive heart failure. 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 He was diuresed. Developed some prerenal azotemia and he has not been taking any diuretics currently. He has not been putting stockings on his legs since he got out of hospital and still has his legs dependent for long periods. Past medical history history of prostate cancer treated with prostatectomy and radiation this was apparently about 8 years ago, history of DVT on chronic Coumadin, history of lymphedema was managed for a while at the clinic in [place]. History of inguinal hernia repair in September 22, hypertension, stage IIIb chronic renal failure ABIs today were noncompressible on the right 1.12 on the left 04-02-2022 upon evaluation today patient appears to be doing well currently in regard to his legs I do feel like both areas that are draining are actually much drier than they were in the picture last week although the left is drier than the right. He is tolerating the 4-layer compression wraps at this point he did contact the pump company and they are actually working on getting him a new compression sleeve for one of his legs which have previously popped and was not functioning properly. 04-23-2022 upon evaluation today  patient appears to be doing well currently in regard to his wounds on the legs. I am actually very pleased with where things stand and I do feel like that we are headed in the right direction. Fortunately there is no sign of active infection locally or systemically at this time. Electronic Signature(s) Signed: 04/23/2022 12:47:54 PM By: Worthy Keeler PA-C Entered By: Worthy Keeler on 04/23/2022 12:47:54 -------------------------------------------------------------------------------- Physical Exam Details Patient Name: Date of Service: [name], [name] 04/23/2022 11:30 A M Medical Record Number: [id number] Patient Account Number: [id number] Date of Birth/Sex: Treating RN: December 13, 1945 (76 y.o. M) Primary Care Provider: [name] Other Clinician: Referring Provider: Treating Provider/Extender: [name], [name] Weeks in Treatment: 4 Constitutional Well-nourished and well-hydrated in no acute distress. Blankenship, [name] ([id number]) [id number] Page 3 of 8 Respiratory normal breathing without difficulty. Psychiatric this patient is able to make decisions and demonstrates good insight into disease process. Alert and Oriented x 3. pleasant and cooperative. Notes Upon inspection patient's wound bed actually showed signs  of good granulation and epithelization at this point. Fortunately I do not see any signs of active infection locally nor systemically which is great news and overall I am extremely pleased with where things stand currently. Electronic Signature(s) Signed: 04/23/2022 12:48:19 PM By: Worthy Keeler PA-C Entered By: Worthy Keeler on 04/23/2022 12:48:19 -------------------------------------------------------------------------------- Physician Orders Details Patient Name: Date of Service: [name], [name] 04/23/2022 11:30 A M Medical Record Number: [id number] Patient Account Number: [id number] Date of Birth/Sex: Treating  RN: [birth year] (76 y.o. [name], [name] Primary Care Provider: [name] Other Clinician: Referring Provider: Treating Provider/Extender: [name] Weeks in Treatment: 4 Verbal / Phone Orders: No Diagnosis Coding ICD-10 Coding Code Description [phone] Non-pressure chronic ulcer of other part of left lower leg with other specified severity 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 other part of right lower leg with other specified severity I89.0 Lymphedema, not elsewhere classified Follow-up Appointments ppointment in 2 weeks. Burman Blacksmith on Wednesday's Return A Nurse Visit: - Next Wednesday 04/30/22 Other: - Ensure to use your lymphedema pumps once your new sleeve comes in. Bathing/ Shower/ Hygiene May shower with protection but do not get wound dressing(s) wet. Edema Control - Lymphedema / SCD / Other Lymphedema Pumps. Use Lymphedema pumps on leg(s) 2-3 times a day for 45-60 minutes. If wearing any wraps or hose, do not remove them. Continue exercising as instructed. Elevate legs to the level of the heart or above for 30 minutes daily and/or when sitting, a frequency of: Avoid standing for long periods of time. Wound Treatment Wound #6 - Lower Leg Wound Laterality: Right, Medial Cleanser: Soap and Water 1 x Per Week/7 Days Discharge Instructions: May shower and wash wound with dial antibacterial soap and water prior to dressing change. Peri-Wound Care: Sween Lotion (Moisturizing lotion) 1 x Per Week/7 Days Discharge Instructions: Apply moisturizing lotion as directed Prim Dressing: KerraCel Ag Gelling Fiber Dressing, 4x5 in (silver alginate) 1 x Per Week/7 Days ary Discharge Instructions: Apply silver alginate to wound bed as instructed Secondary Dressing: ABD Pad, 5x9 1 x Per Week/7 Days Discharge Instructions: Apply over primary dressing as directed. Secondary Dressing: Woven Gauze Sponge, Non-Sterile 4x4 in 1 x Per Week/7 Days Discharge  Instructions: Apply over primary dressing as directed. Compression Wrap: FourPress (4 layer compression wrap) 1 x Per Week/7 Days Discharge Instructions: Apply four layer compression as directed. May also use Miliken CoFlex 2 layer compression system as alternative. Hinckley, [name] ([id number]) [id number] Page 4 of 8 Wound #7 - Lower Leg Wound Laterality: Left, Medial Cleanser: Soap and Water 1 x Per Week/7 Days Discharge Instructions: May shower and wash wound with dial antibacterial soap and water prior to dressing change. Peri-Wound Care: Sween Lotion (Moisturizing lotion) 1 x Per Week/7 Days Discharge Instructions: Apply moisturizing lotion as directed Prim Dressing: KerraCel Ag Gelling Fiber Dressing, 4x5 in (silver alginate) 1 x Per Week/7 Days ary Discharge Instructions: Apply silver alginate to wound bed as instructed Secondary Dressing: ABD Pad, 5x9 1 x Per Week/7 Days Discharge Instructions: Apply over primary dressing as directed. Secondary Dressing: Woven Gauze Sponge, Non-Sterile 4x4 in 1 x Per Week/7 Days Discharge Instructions: Apply over primary dressing as directed. Compression Wrap: FourPress (4 layer compression wrap) 1 x Per Week/7 Days Discharge Instructions: Apply four layer compression as directed. May also use Miliken CoFlex 2 layer compression system as alternative. Electronic Signature(s) Signed: 04/23/2022 4:11:00 PM By: Worthy Keeler PA-C Signed: 05/21/2022 5:35:40 PM By: Rhae Hammock RN Entered  By: Rhae Hammock on 04/23/2022 11:54:13 -------------------------------------------------------------------------------- Problem List Details Patient Name: Date of Service: [name], [name] 04/23/2022 11:30 A M Medical Record Number: [id number] Patient Account Number: [id number] Date of Birth/Sex: Treating RN: December 14, 1945 (76 y.o. M) Primary Care Provider: [name] Other Clinician: Referring Provider: Treating  Provider/Extender: [name] Weeks in Treatment: 4 Active Problems ICD-10 Encounter Code Description Active Date MDM Diagnosis L97.828 Non-pressure chronic ulcer of other part of left lower leg with other specified 03/26/2022 No Yes severity 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 other part of right lower leg with other specified 03/26/2022 No Yes severity I89.0 Lymphedema, not elsewhere classified 03/26/2022 No Yes Inactive Problems Resolved Problems Electronic Signature(s) Signed: 04/23/2022 11:36:39 AM By: Worthy Keeler PA-C Entered By: Worthy Keeler on 04/23/2022 11:36:38 [name], [name] ([id number]) [id number] Page 5 of 8 -------------------------------------------------------------------------------- Progress Note Details Patient Name: Date of Service: [name], [name] 04/23/2022 11:30 A M Medical Record Number: [id number] Patient Account Number: [id number] Date of Birth/Sex: Treating RN: [birth year] (76 y.o. M) Primary Care Provider: [name] Other Clinician: Referring Provider: Treating Provider/Extender: [name] Weeks in Treatment: 4 Subjective Chief Complaint Information obtained from Patient 02/23/2020; patient is here for wounds on his bilateral lower legs in the setting of severe lymphedema 03/26/2022; patient is here for wounds on his bilateral lower legs medial aspect History of Present Illness (HPI) ADMISSION 02/23/2020 Patient is a 76 year old man who lives in [place] who arrives accompanied by his wife. He has a history of chronic lymphedema and venous insufficiency in his bilateral lower legs which may have something to do that with having a history of DVT as well as being treated for prostate cancer. In any case he recently got compression pumps at home but compliance has been an issue here. He has compression stockings however they are probably not sufficient enough to control  swelling. They tell us that things deteriorated for him in late August he was admitted to [facility] for 7 days. This was with cellulitis I think of his bilateral lower legs. Discharge he was noted to have wounds on his bilateral lower legs. He was discharged on Bactrim. They tried to get him home health through [facility] of course they declined him. His wife is been wrapping these applying some form of silver foam dressing. He has a history of wounds before although nothing that would not heal with basic home topical dressings. He has 2 areas on the left medial, left anterior and left lateral and a smaller area on the right medial. All of these have considerable depth. Past medical history includes iron deficiency anemia, lymphedema followed by the rehab center at [facility] with lymphedema wraps I believe, DVT on chronic anticoagulation, prostate cancer, chronic venous insufficiency, hypertension. As mentioned he has compression pumps but does not use them. ABIs in our clinic were noncompressible bilaterally 10/14; patient with severe bilateral lymphedema right greater than left. He came in with bilateral lower extremity wounds left greater than right. Even though the right side has more of the edema most of the wounds here almost closed on the right medial. He has 3 remaining wounds on the left We have been using silver alginate under 4-layer compression I have been trying to get him to be compliant with his external compression pumps 10/21; patient with 3 small wounds on the left leg and 1 on the right medial in the setting of severe lymphedema  and chronic venous insufficiency. We have been using silver alginate under 4-layer compression he is using his external compression pumps twice a day 11/4; ARTERIAL STUDIES on the right show an ABI of 1.02 TBI of 0.858 with biphasic waveforms on the left 0.98 with a TBI of 0.55 and biphasic waveforms. Does not look like he has  significant arterial disease. We are treating him for lymphedema he has compression pumps. He has punched-out areas on the left anterior left lateral and right medial lower extremities 11/11; after we obtained his arterial studies I put him in 4 layer compression. He is using his compression pumps probably once a day although I have asked him to do twice. Primary dressing to the wound is silver collagen he has severe lymphedema likely secondary to chronic venous insufficiency. Wounds on the left lateral, left medial and left anterior and a small area on the right medial 12/2; the area on the right anterior lower leg has healed. We initially thought that the area medially had healed as well however when her discharge nurse came in she detected fluid in the wound simply opened up. This is actually worse than I remember this pain. The area on the left lateral potentially slightly smaller He is also complaining about pain in his left hand he says that this is actually been getting some better he has been using topical creams on this. She asked that I look at this 12/9 after last weeks issues we have 2 wounds one on the right medial lower leg and 1 on the left lateral. Both of these are in the same condition. I think because of thickened skin secondary to chronic lymphedema these wounds actually have depth of almost 0.8 cm. 12/16; the patient has 2 small but deep wounds one on the right medial and one on the left lateral. The right medial is actually the worst of these. He arrives in clinic today with absolutely terrible edema in the right leg apparently his 4-layer wrap fell down to just above his ankle he did not think about this he is apparently been continuing to use his compression pump twice a day. The left leg looks a lot better. 05/09/2020 upon evaluation today patient appears to be doing decently well in regard to his wounds. Everything is measuring smaller the right leg still has a little bit  deeper wound in the left seems to be almost completely healed in my opinion I am very pleased in general with how things are progressing. He has a 4- layer compression wrap we have been using endoform today we will probably have to use collagen just based on the fact that we do not have endoform it is on order. 1/6; the patient's wound on the left lateral lower leg has healed. Still has 1 on the right medial. 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 Using compression pumps at home twice a day. 1/13; left lateral lower leg is still healed. He has a deep punched out rectangular shaped wound on the right medial calf. Looking down at this it appears that he is attempting to epithelialize around the edges of the wound and on the base as well. His edema is reasonably well controlled we have been using collagen with absolutely no effect 1/20; left lateral lower leg remains closed he has extremitease stockings. The area on the right medial calf I aggressively debrided last week measures larger but the surface looks better. We have been using Hydrofera Blue. We ran Oasis through  his insurance but we have not seen the results of this 1/27; left lower leg wound with chronic venous insufficiency and secondary lymphedema. I did aggressive debridement on this last week the wound seems to have come in healthy looking surface using Hydrofera Blue. He was denied for Oasis 2/3; small divot in the right medial lower leg. Under illumination the walls of this divot are epithelialized however the base has slough which I removed with a curette we have been using Hydrofera Blue 2/10 small divot on the right medial lower leg pinpoint illumination at the base of this cone-shaped wound. We have been using Hydrofera Blue but I will switch to calcium alginate this week 2/17; the small divot on the right medial lower leg is fully epithelialized. There is no visible open area under illumination. He has his own  stocking for the right leg similar to the one he has been wearing on the left. [name], [name] ([id number]) [id number] Page 6 of 8 03/26/2022; READMISSION This is a now 76 year old man that we had in the clinic from 02/23/2020 through 07/05/2020. At that point he had bilateral lower extremity wounds left greater than right in the setting of severe lymphedema. He had already obtained compression pumps ordered for him I think from the wound care clinic in [facility] so I do not really have record of what he has been using. He claims to be using them once a day but there is a problem with the sleeve on the left leg. About 2 weeks ago he was hospitalized from 03/11/2022 through 04/59/9774 with diastolic congestive heart failure. 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 He was diuresed. Developed some prerenal azotemia and he has not been taking any diuretics currently. He has not been putting stockings on his legs since he got out of hospital and still has his legs dependent for long periods. Past medical history history of prostate cancer treated with prostatectomy and radiation this was apparently about 8 years ago, history of DVT on chronic Coumadin, history of lymphedema was managed for a while at the clinic in [place]. History of inguinal hernia repair in September 22, hypertension, stage IIIb chronic renal failure ABIs today were noncompressible on the right 1.12 on the left 04-02-2022 upon evaluation today patient appears to be doing well currently in regard to his legs I do feel like both areas that are draining are actually much drier than they were in the picture last week although the left is drier than the right. He is tolerating the 4-layer compression wraps at this point he did contact the pump company and they are actually working on getting him a new compression sleeve for one of his legs which have  previously popped and was not functioning properly. 04-23-2022 upon evaluation today patient appears to be doing well currently in regard to his wounds on the legs. I am actually very pleased with where things stand and I do feel like that we are headed in the right direction. Fortunately there is no sign of active infection locally or systemically at this time. Objective Constitutional Well-nourished and well-hydrated in no acute distress. Vitals Time Taken: 11:41 AM, Height: 74 in, Weight: 250 lbs, BMI: 32.1, Pulse: 96 bpm, Respiratory Rate: 18 breaths/min, Blood Pressure: 183/70 mmHg. Respiratory normal breathing without difficulty. Psychiatric this patient is able to make decisions and demonstrates good insight into disease process. Alert and Oriented x 3. pleasant and cooperative. General Notes: Upon inspection  patient's wound bed actually showed signs of good granulation and epithelization at this point. Fortunately I do not see any signs of active infection locally nor systemically which is great news and overall I am extremely pleased with where things stand currently. Integumentary (Hair, Skin) Wound #6 status is Open. Original cause of wound was Gradually Appeared. The date acquired was: 03/05/2022. The wound has been in treatment 4 weeks. The wound is located on the Right,Medial Lower Leg. The wound measures 8cm length x 4cm width x 0.1cm depth; 25.133cm^2 area and 2.513cm^3 volume. There is Fat Layer (Subcutaneous Tissue) exposed. There is no tunneling or undermining noted. There is a medium amount of purulent drainage noted. The wound margin is distinct with the outline attached to the wound base. There is small (1-33%) red, pink granulation within the wound bed. There is a large (67- 100%) amount of necrotic tissue within the wound bed including Adherent Slough. The periwound skin appearance did not exhibit: Callus, Crepitus, Excoriation, Induration, Rash, Scarring, Dry/Scaly,  Maceration, Atrophie Blanche, Cyanosis, Ecchymosis, Hemosiderin Staining, Mottled, Pallor, Rubor, Erythema. Wound #7 status is Open. Original cause of wound was Gradually Appeared. The date acquired was: 03/05/2022. The wound has been in treatment 4 weeks. The wound is located on the Left,Medial Lower Leg. The wound measures 0.2cm length x 0.2cm width x 0.1cm depth; 0.031cm^2 area and 0.003cm^3 volume. There is Fat Layer (Subcutaneous Tissue) exposed. There is no tunneling or undermining noted. There is a small amount of serosanguineous drainage noted. The wound margin is distinct with the outline attached to the wound base. There is medium (34-66%) red, pink granulation within the wound bed. There is a medium (34-66%) amount of necrotic tissue within the wound bed including Adherent Slough. The periwound skin appearance did not exhibit: Callus, Crepitus, Excoriation, Induration, Rash, Scarring, Dry/Scaly, Maceration, Atrophie Blanche, Cyanosis, Ecchymosis, Hemosiderin Staining, Mottled, Pallor, Rubor, Erythema. 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 Burman Blacksmith on Wednesday's Nurse Visit: - Next Wednesday 04/30/22 Other: - Ensure to use your lymphedema pumps once your new sleeve comes in. Bathing/ Shower/ Hygiene: May shower with protection but do not get wound dressing(s) wet. Edema Control - Lymphedema / SCD / Other: Lymphedema Pumps. Use Lymphedema pumps on leg(s) 2-3 times a day for 45-60 minutes. If wearing any wraps or hose, do not remove them. Continue exercising as instructed. Elevate legs to the level of the heart or above for 30 minutes  daily and/or when sitting, a frequency of: Avoid standing for long periods of time. WOUND #6: - Lower Leg Wound Laterality: Right, Medial Cleanser: Soap and Water 1 x Per Week/7 Days Discharge Instructions: May shower and wash wound with dial antibacterial soap and water prior to dressing change. Peri-Wound Care: Sween Lotion (Moisturizing lotion) 1 x Per Week/7 Days Discharge Instructions: Apply moisturizing lotion as directed Prim Dressing: KerraCel Ag Gelling Fiber Dressing, 4x5 in (silver alginate) 1 x Per Week/7 Days ary Discharge Instructions: Apply silver alginate to wound bed as instructed Secondary Dressing: ABD Pad, 5x9 1 x Per Week/7 Days Discharge Instructions: Apply over primary dressing as directed. Secondary Dressing: Woven Gauze Sponge, Non-Sterile 4x4 in 1 x Per Week/7 Days Discharge Instructions: Apply over primary dressing as directed. Com pression Wrap:  FourPress (4 layer compression wrap) 1 x Per Week/7 Days Discharge Instructions: Apply four layer compression as directed. May also use Miliken CoFlex 2 layer compression system as alternative. WOUND #7: - Lower Leg Wound Laterality: Left, Medial Cleanser: Soap and Water 1 x Per Week/7 Days Discharge Instructions: May shower and wash wound with dial antibacterial soap and water prior to dressing change. Peri-Wound Care: Sween Lotion (Moisturizing lotion) 1 x Per Week/7 Days Discharge Instructions: Apply moisturizing lotion as directed Prim Dressing: KerraCel Ag Gelling Fiber Dressing, 4x5 in (silver alginate) 1 x Per Week/7 Days ary Discharge Instructions: Apply silver alginate to wound bed as instructed Secondary Dressing: ABD Pad, 5x9 1 x Per Week/7 Days Discharge Instructions: Apply over primary dressing as directed. Secondary Dressing: Woven Gauze Sponge, Non-Sterile 4x4 in 1 x Per Week/7 Days Discharge Instructions: Apply over primary dressing as directed. Com pression Wrap: FourPress (4 layer compression wrap) 1 x  Per Week/7 Days Discharge Instructions: Apply four layer compression as directed. May also use Miliken CoFlex 2 layer compression system as alternative. 1. I am going to recommend that we have the patient continue to monitor for any signs of infection or worsening. Obviously if anything changes he can contact the office and let me know. 2. I am also can recommend that we continue with the 4-layer compression wrap which I think is doing a good job. 3. We will get a stick with the silver alginate dressing followed by the ABD pads which I feel like are doing a good job here. We will see patient back for reevaluation in 1 week here in the clinic. If anything worsens or changes patient will contact our office for additional recommendations. Electronic Signature(s) Signed: 04/23/2022 12:49:08 PM By: Worthy Keeler PA-C Entered By: Worthy Keeler on 04/23/2022 12:49:08 -------------------------------------------------------------------------------- SuperBill Details Patient Name: Date of Service: [name], [name] 04/23/2022 Medical Record Number: [id number] Patient Account Number: [id number] Date of Birth/Sex: Treating RN: [birth year] (76 y.o. M) Primary Care Provider: [name] Other Clinician: Referring Provider: Treating Provider/Extender: [name] Weeks in Treatment: 4 Diagnosis Coding ICD-10 Codes Code Description [phone] Non-pressure chronic ulcer of other part of left lower leg with other specified severity 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 other part of right lower leg with other specified severity I89.0 Lymphedema, not elsewhere classified Facility Procedures : [place], 3 CPT4: Code [name] ([id number] foot. Description: Han.Grana) [id number] BILATERAL: Application of multi-layer venous compression system; leg (below knee), including ankle and Modifier: [id number] Pa 1 Quantity: ge 8 of 8 Physician Procedures : CPT4 Code  Description Modifier [id number] - WC PHYS LEVEL 3 - EST PT ICD-10 Diagnosis Description 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 I89.0 Lymphedema, not elsewhere classified Quantity: 1 Electronic Signature(s) Signed: 05/21/2022 4:16:52 PM By: Worthy Keeler PA-C Signed: 05/21/2022 5:35:40 PM By: Rhae Hammock RN Previous Signature: 04/23/2022 12:51:16 PM Version By: Worthy Keeler PA-C Entered By: Rhae Hammock on 05/07/2022 08:06:41

## 2022-05-01 ENCOUNTER — Encounter (HOSPITAL_BASED_OUTPATIENT_CLINIC_OR_DEPARTMENT_OTHER): Payer: Medicare Other | Admitting: Internal Medicine

## 2022-05-01 DIAGNOSIS — L97828 Non-pressure chronic ulcer of other part of left lower leg with other specified severity: Secondary | ICD-10-CM | POA: Diagnosis not present

## 2022-05-01 NOTE — Progress Notes (Signed)
Senseney, [name] ([id number]) [id number] Page 1 of 5 Visit Report for 05/01/2022 Arrival Information Details Patient Name: Date of Service: [name], [name] 05/01/2022 11:30 A M Medical Record Number: [id number] Patient Account Number: [id number] Date of Birth/Sex: Treating RN: [birth year] (76 y.o. M) Primary Care Sean Macwilliams: [name] Other Clinician: Referring Ajax Schroll: Treating Adan Beal/Extender: [name]: 5 Visit Information History Since Last Visit Added or deleted any medications: No Patient Arrived: Cane Any new allergies or adverse reactions: No Arrival Time: 11:30 Had a fall or experienced change in No Accompanied By: wife activities of daily living that may affect Transfer Assistance: None risk of falls: Patient Identification Verified: Yes Signs or symptoms of abuse/neglect since last visito No Secondary Verification Process Completed: Yes Hospitalized since last visit: No Patient Requires Transmission-Based Precautions: No Implantable device outside of the clinic excluding No Patient Has Alerts: Yes cellular tissue based products placed in the center Patient Alerts: Patient on Blood Thinner since last visit: Right ABI in clinic [place] Has Compression in Place as Prescribed: Yes Pain Present Now: No Electronic Signature(s) Signed: 05/01/2022 4:17:22 PM By: Erenest Blank Entered By: Erenest Blank on 05/01/2022 16:14:45 -------------------------------------------------------------------------------- Compression Therapy Details Patient Name: Date of Service: [name], [name] 05/01/2022 11:30 A M Medical Record Number: [id number] Patient Account Number: [id number] Date of Birth/Sex: Treating RN: June 21, 1945 (76 y.o. M) Primary Care Ladavion Savitz: [name] Other Clinician: Referring Willaim Mode: Treating Jobany Montellano/Extender: [name]: 5 Compression Therapy  Performed for Wound Assessment: Wound #6 Right,Medial Lower Leg Performed By: Clinician Erenest Blank, Compression Type: Four Layer Electronic Signature(s) Signed: 05/01/2022 4:17:22 PM By: Erenest Blank Entered By: Erenest Blank on 05/01/2022 16:15:52 -------------------------------------------------------------------------------- Compression Therapy Details Patient Name: Date of Service: [name], [name] 05/01/2022 11:30 A M Medical Record Number: [id number] Patient Account Number: [id number] Date of Birth/Sex: Treating RN: [birth year] (76 y.o. M) [place], [name] ([id number]) [id number] Page 2 of 5 Primary Care Legrande Hao: [name] Other Clinician: Referring Rolinda Impson: Treating Melodie Ashworth/Extender: [name]: 5 Compression Therapy Performed for Wound Assessment: Wound #7 Left,Medial Lower Leg Performed By: Clinician Erenest Blank, Compression Type: Four Layer Electronic Signature(s) Signed: 05/01/2022 4:17:22 PM By: Erenest Blank Entered By: Erenest Blank on 05/01/2022 16:16:01 -------------------------------------------------------------------------------- Encounter Discharge Information Details Patient Name: Date of Service: [name], [name] 05/01/2022 11:30 A M Medical Record Number: [id number] Patient Account Number: [id number] Date of Birth/Sex: Treating RN: 1946/01/24 (76 y.o. M) Primary Care Ashlynne [name]: [name] Other Clinician: Erenest Blank Referring Sashia Campas: Treating Ustin Cruickshank/Extender: [name]: 5 Encounter Discharge Information Items Discharge Condition: Stable Ambulatory Status: Cane Discharge Destination: Home Transportation: Private Auto Accompanied By: wife Schedule Follow-up Appointment: Yes Clinical Summary of Care: Electronic Signature(s) Signed: 05/01/2022 4:17:22 PM By: Erenest Blank Entered By: Erenest Blank on 05/01/2022  16:16:38 -------------------------------------------------------------------------------- Patient/Caregiver Education Details Patient Name: Date of Service: [name], [name] 12/14/2023andnbsp11:30 A M Medical Record Number: [id number] Patient Account Number: [id number] Date of Birth/Gender: Treating RN: [birth year] (76 y.o. M) Primary Care Physician: [name] Other Clinician: Erenest Blank Referring Physician: Treating Physician/Extender: [name] in Treatment: 5 Education Assessment Education Provided To: Patient Education Topics Provided Electronic Signature(s) Signed: 05/01/2022 4:17:22 PM By: Erenest Blank Entered By: Erenest Blank on 05/01/2022 16:16:21 [name] ([id number]) [id number] Page 3 of 5 -------------------------------------------------------------------------------- Wound Assessment Details Patient Name: Date of Service: [name] 05/01/2022 11:30 A M Medical Record Number:  [id number] Patient Account Number: [id number] Date of Birth/Sex: Treating RN: 22-Sep-1945 (76 y.o. M) Primary Care Thayne Cindric: [name] Other Clinician: Referring Haniah Penny: Treating Dellamae Rosamilia/Extender: [name]: 5 Wound Status Wound Number: 6 Primary Etiology: Lymphedema Wound Location: Right, Medial Lower Leg Wound Status: Open Wounding Event: Gradually Appeared Date Acquired: 03/05/2022 Weeks Of Treatment: 5 Clustered Wound: No Wound Measurements Length: (cm) 8 Width: (cm) 4 Depth: (cm) 0.1 Area: (cm) 25.133 Volume: (cm) 2.513 % Reduction in Area: 82.4% % Reduction in Volume: 82.4% Wound Description Classification: Full Thickness Without Exposed Suppor Exudate Amount: Medium Exudate Type: Purulent Exudate Color: yellow, brown, green t Structures Periwound Skin Texture Texture Color No Abnormalities Noted: No No Abnormalities Noted:  No Moisture No Abnormalities Noted: No Treatment Notes Wound #6 (Lower Leg) Wound Laterality: Right, Medial Cleanser Soap and Water Discharge Instruction: May shower and wash wound with dial antibacterial soap and water prior to dressing change. Peri-Wound Care Sween Lotion (Moisturizing lotion) Discharge Instruction: Apply moisturizing lotion as directed Topical Primary Dressing KerraCel Ag Gelling Fiber Dressing, 4x5 in (silver alginate) Discharge Instruction: Apply silver alginate to wound bed as instructed Secondary Dressing ABD Pad, 5x9 Discharge Instruction: Apply over primary dressing as directed. Woven Gauze Sponge, Non-Sterile 4x4 in Discharge Instruction: Apply over primary dressing as directed. Secured With Compression Wrap FourPress (4 layer compression wrap) Discharge Instruction: Apply four layer compression as directed. May also use Miliken CoFlex 2 layer compression system as alternative. Compression Stockings [place], [name] ([id number]) [id number] Page 4 of 5 Add-Ons Electronic Signature(s) Signed: 05/01/2022 4:17:22 PM By: Erenest Blank Entered By: Erenest Blank on 05/01/2022 16:15:06 -------------------------------------------------------------------------------- Wound Assessment Details Patient Name: Date of Service: [place], [name] 05/01/2022 11:30 A M Medical Record Number: [id number] Patient Account Number: [id number] Date of Birth/Sex: Treating RN: Apr 08, 1946 (76 y.o. M) Primary Care Cheyanne Lamison: [name] Other Clinician: Referring Dashel Goines: Treating Sundee Garland/Extender: [name]: 5 Wound Status Wound Number: 7 Primary Etiology: Lymphedema Wound Location: Left, Medial Lower Leg Wound Status: Open Wounding Event: Gradually Appeared Date Acquired: 03/05/2022 Weeks Of Treatment: 5 Clustered Wound: No Wound Measurements Length: (cm) 0.2 Width: (cm) 0.2 Depth: (cm)  0.1 Area: (cm) 0.031 Volume: (cm) 0.003 % Reduction in Area: 99.9% % Reduction in Volume: 99.9% Wound Description Classification: Full Thickness Without Exposed Suppor Exudate Amount: Small Exudate Type: Serosanguineous Exudate Color: red, brown t Structures Periwound Skin Texture Texture Color No Abnormalities Noted: No No Abnormalities Noted: No Moisture No Abnormalities Noted: No Treatment Notes Wound #7 (Lower Leg) Wound Laterality: Left, Medial Cleanser Soap and Water Discharge Instruction: May shower and wash wound with dial antibacterial soap and water prior to dressing change. Peri-Wound Care Sween Lotion (Moisturizing lotion) Discharge Instruction: Apply moisturizing lotion as directed Topical Primary Dressing KerraCel Ag Gelling Fiber Dressing, 4x5 in (silver alginate) Discharge Instruction: Apply silver alginate to wound bed as instructed Secondary Dressing ABD Pad, 5x9 Discharge Instruction: Apply over primary dressing as directed. Woven Gauze Sponge, Non-Sterile 4x4 in Discharge Instruction: Apply over primary dressing as directed. [place], [name] ([id number]) [id number] Page 5 of 5 Secured With Compression Wrap FourPress (4 layer compression wrap) Discharge Instruction: Apply four layer compression as directed. May also use Miliken CoFlex 2 layer compression system as alternative. Compression Stockings Add-Ons Electronic Signature(s) Signed: 05/01/2022 4:17:22 PM By: Erenest Blank Entered By: Erenest Blank on 05/01/2022 16:15:07 -------------------------------------------------------------------------------- Vitals Details Patient Name: Date of Service: Stave, [name] 05/01/2022 11:30 A M Medical Record Number: [id number] Patient  Account Number: [id number] Date of Birth/Sex: Treating RN: [birth year] (76 y.o. M) Primary Care Cheveyo [place]: [name] Other Clinician: Referring Cyruss Arata: Treating Neithan Day/Extender:  [name]: 5 Vital Signs Time Taken: 11:35 Reference Range: 80 - 120 mg / dl Height (in): 74 Weight (lbs): 250 Body Mass Index (BMI): 32.1 Electronic Signature(s) Signed: 05/01/2022 4:17:22 PM By: Erenest Blank Entered By: Erenest Blank on 05/01/2022 16:14:52

## 2022-05-01 NOTE — Progress Notes (Signed)
Dingley, [name] ([id number]) [id number] Page 1 of 1 Visit Report for 05/01/2022 SuperBill Details Patient Name: Date of Service: [name], [name] 05/01/2022 Medical Record Number: [id number] Patient Account Number: [id number] Date of Birth/Sex: Treating RN: [birth year] (76 y.o. M) Primary Care Provider: [name] Other Clinician: Erenest Blank Referring Provider: Treating Provider/Extender: [name]: 5 Diagnosis Coding ICD-10 Codes Code Description [phone] Non-pressure chronic ulcer of other part of left lower leg with other specified severity 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 other part of right lower leg with other specified severity I89.0 Lymphedema, not elsewhere classified Facility Procedures CPT4 Description Modifier Quantity Code [id number] BILATERAL: Application of multi-layer venous compression system; leg (below knee), including ankle and 1 foot. Electronic Signature(s) Signed: 05/01/2022 4:17:22 PM By: Erenest Blank Signed: 05/01/2022 5:16:42 PM By: Kalman Shan DO Entered By: Erenest Blank on 05/01/2022 16:16:52

## 2022-05-07 ENCOUNTER — Encounter (HOSPITAL_BASED_OUTPATIENT_CLINIC_OR_DEPARTMENT_OTHER): Payer: Medicare Other | Admitting: Physician Assistant

## 2022-05-07 DIAGNOSIS — L97828 Non-pressure chronic ulcer of other part of left lower leg with other specified severity: Secondary | ICD-10-CM | POA: Diagnosis not present

## 2022-05-07 NOTE — Progress Notes (Signed)
[name] ([id number]) [id number] Page 1 of 8 Visit Report for 05/07/2022 Chief Complaint Document Details Patient Name: Date of Service: [name], [name] 05/07/2022 2:45 PM Medical Record Number: [id number] Patient Account Number: [id number] Date of Birth/Sex: Treating RN: 13-Jul-1945 (76 y.o. M) Primary Care Provider: [name] Other Clinician: Referring Provider: Treating Provider/Extender: [name] Weeks in Treatment: 6 Information Obtained from: Patient Chief Complaint 02/23/2020; patient is here for wounds on his bilateral lower legs in the setting of severe lymphedema 03/26/2022; patient is here for wounds on his bilateral lower legs medial aspect Electronic Signature(s) Signed: 05/07/2022 3:50:29 PM By: Worthy Keeler PA-C Entered By: Worthy Keeler on 05/07/2022 15:50:28 -------------------------------------------------------------------------------- HPI Details Patient Name: Date of Service: [name], [name] Vista 05/07/2022 2:45 PM Medical Record Number: [id number] Patient Account Number: [id number] Date of Birth/Sex: Treating RN: May 21, 1945 (76 y.o. M) Primary Care Provider: [name] Other Clinician: Referring Provider: Treating Provider/Extender: [name] Weeks in Treatment: 6 History of Present Illness HPI Description: ADMISSION 02/23/2020 Patient is a 76 year old man who lives in [place] who arrives accompanied by his wife. He has a history of chronic lymphedema and venous insufficiency in his bilateral lower legs which may have something to do that with having a history of DVT as well as being treated for prostate cancer. In any case he recently got compression pumps at home but compliance has been an issue here. He has compression stockings however they are probably not sufficient enough to control swelling. They tell us that things deteriorated for him in late August he was  admitted to [facility] for 7 days. This was with cellulitis I think of his bilateral lower legs. Discharge he was noted to have wounds on his bilateral lower legs. He was discharged on Bactrim. They tried to get him home health through [facility] of course they declined him. His wife is been wrapping these applying some form of silver foam dressing. He has a history of wounds before although nothing that would not heal with basic home topical dressings. He has 2 areas on the left medial, left anterior and left lateral and a smaller area on the right medial. All of these have considerable depth. Past medical history includes iron deficiency anemia, lymphedema followed by the rehab center at [facility] with lymphedema wraps I believe, DVT on chronic anticoagulation, prostate cancer, chronic venous insufficiency, hypertension. As mentioned he has compression pumps but does not use them. ABIs in our clinic were noncompressible bilaterally 10/14; patient with severe bilateral lymphedema right greater than left. He came in with bilateral lower extremity wounds left greater than right. Even though the right side has more of the edema most of the wounds here almost closed on the right medial. He has 3 remaining wounds on the left We have been using silver alginate under 4-layer compression I have been trying to get him to be compliant with his external compression pumps 10/21; patient with 3 small wounds on the left leg and 1 on the right medial in the setting of severe lymphedema and chronic venous insufficiency. We have been using silver alginate under 4-layer compression he is using his external compression pumps twice a day 11/4; ARTERIAL STUDIES on the right show an ABI of 1.02 TBI of 0.858 with biphasic waveforms on the left 0.98 with a TBI of 0.55 and biphasic waveforms. Does not look like he has significant arterial disease. We are treating  him for lymphedema he has compression  pumps. He has punched-out areas on the left anterior left lateral and right medial lower extremities 11/11; after we obtained his arterial studies I put him in 4 layer compression. He is using his compression pumps probably once a day although I have asked [name], Kasandra [name] ([id number]) [id number] Page 2 of 8 him to do twice. Primary dressing to the wound is silver collagen he has severe lymphedema likely secondary to chronic venous insufficiency. Wounds on the left lateral, left medial and left anterior and a small area on the right medial 12/2; the area on the right anterior lower leg has healed. We initially thought that the area medially had healed as well however when her discharge nurse came in she detected fluid in the wound simply opened up. This is actually worse than I remember this pain. The area on the left lateral potentially slightly smaller He is also complaining about pain in his left hand he says that this is actually been getting some better he has been using topical creams on this. She asked that I look at this 12/9 after last weeks issues we have 2 wounds one on the right medial lower leg and 1 on the left lateral. Both of these are in the same condition. I think because of thickened skin secondary to chronic lymphedema these wounds actually have depth of almost 0.8 cm. 12/16; the patient has 2 small but deep wounds one on the right medial and one on the left lateral. The right medial is actually the worst of these. He arrives in clinic today with absolutely terrible edema in the right leg apparently his 4-layer wrap fell down to just above his ankle he did not think about this he is apparently been continuing to use his compression pump twice a day. The left leg looks a lot better. 05/09/2020 upon evaluation today patient appears to be doing decently well in regard to his wounds. Everything is measuring smaller the right leg still has a little bit deeper  wound in the left seems to be almost completely healed in my opinion I am very pleased in general with how things are progressing. He has a 4- layer compression wrap we have been using endoform today we will probably have to use collagen just based on the fact that we do not have endoform it is on order. 1/6; the patient's wound on the left lateral lower leg has healed. Still has 1 on the right medial. 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 Using compression pumps at home twice a day. 1/13; left lateral lower leg is still healed. He has a deep punched out rectangular shaped wound on the right medial calf. Looking down at this it appears that he is attempting to epithelialize around the edges of the wound and on the base as well. His edema is reasonably well controlled we have been using collagen with absolutely no effect 1/20; left lateral lower leg remains closed he has extremitease stockings. The area on the right medial calf I aggressively debrided last week measures larger but the surface looks better. We have been using Hydrofera Blue. We ran Oasis through his insurance but we have not seen the results of this 1/27; left lower leg wound with chronic venous insufficiency and secondary lymphedema. I did aggressive debridement on this last week the wound seems to have come in healthy looking surface using Hydrofera Blue. He was denied for Oasis 2/3; small divot in the right medial  lower leg. Under illumination the walls of this divot are epithelialized however the base has slough which I removed with a curette we have been using Hydrofera Blue 2/10 small divot on the right medial lower leg pinpoint illumination at the base of this cone-shaped wound. We have been using Hydrofera Blue but I will switch to calcium alginate this week 2/17; the small divot on the right medial lower leg is fully epithelialized. There is no visible open area under illumination. He has his own  stocking for the right leg similar to the one he has been wearing on the left. 03/26/2022; READMISSION This is a now 76 year old man that we had in the clinic from 02/23/2020 through 07/05/2020. At that point he had bilateral lower extremity wounds left greater than right in the setting of severe lymphedema. He had already obtained compression pumps ordered for him I think from the wound care clinic in [facility] so I do not really have record of what he has been using. He claims to be using them once a day but there is a problem with the sleeve on the left leg. About 2 weeks ago he was hospitalized from 03/11/2022 through 96/08/5407 with diastolic congestive heart failure. 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 He was diuresed. Developed some prerenal azotemia and he has not been taking any diuretics currently. He has not been putting stockings on his legs since he got out of hospital and still has his legs dependent for long periods. 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 History of inguinal hernia repair in September 22, hypertension, stage IIIb chronic renal failure ABIs today were noncompressible on the right 1.12 on the left 04-02-2022 upon evaluation today patient appears to be doing well currently in regard to his legs I do feel like both areas that are draining are actually much drier than they were in the picture last week although the left is drier than the right. He is tolerating the 4-layer compression wraps at this point he did contact the pump company and they are actually working on getting him a new compression sleeve for one of his legs which have previously popped and was not functioning properly. 04-23-2022 upon evaluation today  patient appears to be doing well currently in regard to his wounds on the legs. I am actually very pleased with where things stand and I do feel like that we are headed in the right direction. Fortunately there is no sign of active infection locally or systemically at this time. 05-07-2022 upon evaluation today patient appears to be doing well currently in regard to his wounds in fact things are showing signs of improvement which is good news I do not see too much that actually appears to be open and I am very pleased in that regard. No fevers, chills, nausea, vomiting, or diarrhea. Electronic Signature(s) Signed: 05/07/2022 5:56:17 PM By: Worthy Keeler PA-C Entered By: Worthy Keeler on 05/07/2022 17:56:17 -------------------------------------------------------------------------------- Physical Exam Details Patient Name: Date of Service: Crossen, [place] 05/07/2022 2:45 PM Medical Record Number: [id number] Patient Account Number: [id number] Date of Birth/Sex: Treating RN: [birth year] (76 y.o. M) Primary Care Provider: [name] Other Clinician: Referring Provider: Treating Provider/Extender: [name], [name] Weeks in Treatment: 6 Kassa,  Kasandra [name] ([id number]) [id number] Page 3 of 8 Constitutional Chronically ill appearing but in no apparent acute distress. Respiratory normal breathing without difficulty. Psychiatric this patient is able to make decisions and demonstrates good insight into disease process. Alert and Oriented x 3. pleasant and cooperative. Notes Upon inspection patient's wound bed actually showed signs of good granulation and epithelization at this point. Fortunately I see no signs of anything worsening which I am extremely pleased with. Overall I think that we are headed in the right direction is just a matter of time as far as getting this completely closed. The patient voiced understanding. Electronic  Signature(s) Signed: 05/07/2022 5:57:03 PM By: Worthy Keeler PA-C Entered By: Worthy Keeler on 05/07/2022 17:57:03 -------------------------------------------------------------------------------- Physician Orders Details Patient Name: Date of Service: Popko, [name] 05/07/2022 2:45 PM Medical Record Number: [id number] Patient Account Number: [id number] Date of Birth/Sex: Treating RN: [birth year] (76 y.o. [name], [name] Primary Care Provider: [name] Other Clinician: Referring Provider: Treating Provider/Extender: [name] Weeks in Treatment: 6 Verbal / Phone Orders: No Diagnosis Coding ICD-10 Coding Code Description [phone] Non-pressure chronic ulcer of other part of left lower leg with other specified severity 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 other part of right lower leg with other specified severity I89.0 Lymphedema, not elsewhere classified Follow-up Appointments ppointment in 2 weeks. Burman Blacksmith on Wednesday's Return A Nurse Visit: - Next Wednesday 05/14/22 Other: - Ensure to use your lymphedema pumps once your new sleeve comes in. Bathing/ Shower/ Hygiene May shower with protection but do not get wound dressing(s) wet. Edema Control - Lymphedema / SCD / Other Lymphedema Pumps. Use Lymphedema pumps on leg(s) 2-3 times a day for 45-60 minutes. If wearing any wraps or hose, do not remove them. Continue exercising as instructed. Elevate legs to the level of the heart or above for 30 minutes daily and/or when sitting, a frequency of: Avoid standing for long periods of time. Wound Treatment Wound #6 - Lower Leg Wound Laterality: Right, Medial Cleanser: Soap and Water 1 x Per Week/7 Days Discharge Instructions: May shower and wash wound with dial antibacterial soap and water prior to dressing change. Peri-Wound Care: Sween Lotion (Moisturizing lotion) 1 x Per Week/7 Days Discharge Instructions: Apply moisturizing lotion as  directed Prim Dressing: KerraCel Ag Gelling Fiber Dressing, 4x5 in (silver alginate) 1 x Per Week/7 Days ary Discharge Instructions: Apply silver alginate to wound bed as instructed Secondary Dressing: ABD Pad, 5x9 1 x Per Week/7 Days Discharge Instructions: Apply over primary dressing as directed. Secondary Dressing: Woven Gauze Sponge, Non-Sterile 4x4 in 1 x Per Week/7 Days Discharge Instructions: Apply over primary dressing as directed. Compression Wrap: FourPress (4 layer compression wrap) 1 x Per Week/7 Days Elicker, Kasandra [name] ([id number]) [id number] Page 4 of 8 Discharge Instructions: Apply four layer compression as directed. May also use Miliken CoFlex 2 layer compression system as alternative. Wound #7 - Lower Leg Wound Laterality: Left, Medial Cleanser: Soap and Water 1 x Per Week/7 Days Discharge Instructions: May shower and wash wound with dial antibacterial soap and water prior to dressing change. Peri-Wound Care: Sween Lotion (Moisturizing lotion) 1 x Per Week/7 Days Discharge Instructions: Apply moisturizing lotion as directed Prim Dressing: KerraCel Ag Gelling Fiber Dressing, 4x5 in (silver alginate) 1 x Per Week/7 Days ary Discharge Instructions: Apply silver alginate to wound bed as instructed Secondary Dressing: ABD Pad, 5x9 1 x Per Week/7 Days Discharge Instructions: Apply over primary dressing as directed. Secondary Dressing: Woven Gauze Sponge,  Non-Sterile 4x4 in 1 x Per Week/7 Days Discharge Instructions: Apply over primary dressing as directed. Compression Wrap: FourPress (4 layer compression wrap) 1 x Per Week/7 Days Discharge Instructions: Apply four layer compression as directed. May also use Miliken CoFlex 2 layer compression system as alternative. Electronic Signature(s) Signed: 05/07/2022 5:39:23 PM By: Rhae Hammock RN Signed: 05/07/2022 6:40:18 PM By: Worthy Keeler PA-C Entered By: Rhae Hammock on 05/07/2022  16:14:40 -------------------------------------------------------------------------------- Problem List Details Patient Name: Date of Service: Corniel, [place] 05/07/2022 2:45 PM Medical Record Number: [id number] Patient Account Number: [id number] Date of Birth/Sex: Treating RN: [birth year] (76 y.o. M) Primary Care Provider: [name] Other Clinician: Referring Provider: Treating Provider/Extender: [name] Weeks in Treatment: 6 Active Problems ICD-10 Encounter Code Description Active Date MDM Diagnosis L97.828 Non-pressure chronic ulcer of other part of left lower leg with other specified 03/26/2022 No Yes severity 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 other part of right lower leg with other specified 03/26/2022 No Yes severity I89.0 Lymphedema, not elsewhere classified 03/26/2022 No Yes Inactive Problems Resolved Problems Electronic Signature(s) Signed: 05/07/2022 3:50:18 PM By: Worthy Keeler PA-C Entered By: Worthy Keeler on 05/07/2022 15:50:18 Cuccaro, Kasandra [name] ([id number]) [id number] Page 5 of 8 -------------------------------------------------------------------------------- Progress Note Details Patient Name: Date of Service: Ipock, [name] 05/07/2022 2:45 PM Medical Record Number: [id number] Patient Account Number: [id number] Date of Birth/Sex: Treating RN: 1946/04/13 (76 y.o. M) Primary Care Provider: [name] Other Clinician: Referring Provider: Treating Provider/Extender: [name] Weeks in Treatment: 6 Subjective Chief Complaint Information obtained from Patient 02/23/2020; patient is here for wounds on his bilateral lower legs in the setting of severe lymphedema 03/26/2022; patient is here for wounds on his bilateral lower legs medial aspect History of Present Illness (HPI) ADMISSION 02/23/2020 Patient is a 76 year old man who lives in [place] who arrives accompanied by his  wife. He has a history of chronic lymphedema and venous insufficiency in his bilateral lower legs which may have something to do that with having a history of DVT as well as being treated for prostate cancer. In any case he recently got compression pumps at home but compliance has been an issue here. He has compression stockings however they are probably not sufficient enough to control swelling. They tell us that things deteriorated for him in late August he was admitted to [facility] for 7 days. This was with cellulitis I think of his bilateral lower legs. Discharge he was noted to have wounds on his bilateral lower legs. He was discharged on Bactrim. They tried to get him home health through [facility] of course they declined him. His wife is been wrapping these applying some form of silver foam dressing. He has a history of wounds before although nothing that would not heal with basic home topical dressings. He has 2 areas on the left medial, left anterior and left lateral and a smaller area on the right medial. All of these have considerable depth. Past medical history includes iron deficiency anemia, lymphedema followed by the rehab center at [facility] with lymphedema wraps I believe, DVT on chronic anticoagulation, prostate cancer, chronic venous insufficiency, hypertension. As mentioned he has compression pumps but does not use them. ABIs in our clinic were noncompressible bilaterally 10/14; patient with severe bilateral lymphedema right greater than left. He came in with bilateral lower extremity wounds left greater than right. Even though the right side has more of the edema  most of the wounds here almost closed on the right medial. He has 3 remaining wounds on the left We have been using silver alginate under 4-layer compression I have been trying to get him to be compliant with his external compression pumps 10/21; patient with 3 small wounds on the left leg and 1 on  the right medial in the setting of severe lymphedema and chronic venous insufficiency. We have been using silver alginate under 4-layer compression he is using his external compression pumps twice a day 11/4; ARTERIAL STUDIES on the right show an ABI of 1.02 TBI of 0.858 with biphasic waveforms on the left 0.98 with a TBI of 0.55 and biphasic waveforms. Does not look like he has significant arterial disease. We are treating him for lymphedema he has compression pumps. He has punched-out areas on the left anterior left lateral and right medial lower extremities 11/11; after we obtained his arterial studies I put him in 4 layer compression. He is using his compression pumps probably once a day although I have asked him to do twice. Primary dressing to the wound is silver collagen he has severe lymphedema likely secondary to chronic venous insufficiency. Wounds on the left lateral, left medial and left anterior and a small area on the right medial 12/2; the area on the right anterior lower leg has healed. We initially thought that the area medially had healed as well however when her discharge nurse came in she detected fluid in the wound simply opened up. This is actually worse than I remember this pain. The area on the left lateral potentially slightly smaller He is also complaining about pain in his left hand he says that this is actually been getting some better he has been using topical creams on this. She asked that I look at this 12/9 after last weeks issues we have 2 wounds one on the right medial lower leg and 1 on the left lateral. Both of these are in the same condition. I think because of thickened skin secondary to chronic lymphedema these wounds actually have depth of almost 0.8 cm. 12/16; the patient has 2 small but deep wounds one on the right medial and one on the left lateral. The right medial is actually the worst of these. He arrives in clinic today with absolutely terrible edema in  the right leg apparently his 4-layer wrap fell down to just above his ankle he did not think about this he is apparently been continuing to use his compression pump twice a day. The left leg looks a lot better. 05/09/2020 upon evaluation today patient appears to be doing decently well in regard to his wounds. Everything is measuring smaller the right leg still has a little bit deeper wound in the left seems to be almost completely healed in my opinion I am very pleased in general with how things are progressing. He has a 4- layer compression wrap we have been using endoform today we will probably have to use collagen just based on the fact that we do not have endoform it is on order. 1/6; the patient's wound on the left lateral lower leg has healed. Still has 1 on the right medial. 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 Using compression pumps at home twice a day. 1/13; left lateral lower leg is still healed. He has a deep punched out rectangular shaped wound on the right medial calf. Looking down at this it appears that he is attempting to epithelialize around the  edges of the wound and on the base as well. His edema is reasonably well controlled we have been using collagen with absolutely no effect 1/20; left lateral lower leg remains closed he has extremitease stockings. The area on the right medial calf I aggressively debrided last week measures larger but the surface looks better. We have been using Hydrofera Blue. We ran Oasis through his insurance but we have not seen the results of this 1/27; left lower leg wound with chronic venous insufficiency and secondary lymphedema. I did aggressive debridement on this last week the wound seems to have come in healthy looking surface using Hydrofera Blue. He was denied for Oasis 2/3; small divot in the right medial lower leg. Under illumination the walls of this divot are epithelialized however the base has slough which I removed  with a curette we have been using Hydrofera Blue 2/10 small divot on the right medial lower leg pinpoint illumination at the base of this cone-shaped wound. We have been using [facility] but I will switch Pillars, Duval ([id number]) [id number] Page 6 of 8 to calcium alginate this week 2/17; the small divot on the right medial lower leg is fully epithelialized. There is no visible open area under illumination. He has his own stocking for the right leg similar to the one he has been wearing on the left. 03/26/2022; READMISSION This is a now 76 year old man that we had in the clinic from 02/23/2020 through 07/05/2020. At that point he had bilateral lower extremity wounds left greater than right in the setting of severe lymphedema. He had already obtained compression pumps ordered for him I think from the wound care clinic in [facility] so I do not really have record of what he has been using. He claims to be using them once a day but there is a problem with the sleeve on the left leg. About 2 weeks ago he was hospitalized from 03/11/2022 through 49/20/1007 with diastolic congestive heart failure. 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 He was diuresed. Developed some prerenal azotemia and he has not been taking any diuretics currently. He has not been putting stockings on his legs since he got out of hospital and still has his legs dependent for long periods. Past medical history history of prostate cancer treated with prostatectomy and radiation this was apparently about 8 years ago, history of DVT on chronic Coumadin, history of lymphedema was managed for a while at the clinic in [place]. History of inguinal hernia repair in September 22, hypertension, stage IIIb chronic renal failure ABIs today were noncompressible on the right 1.12 on the left 04-02-2022 upon evaluation today patient appears to be doing  well currently in regard to his legs I do feel like both areas that are draining are actually much drier than they were in the picture last week although the left is drier than the right. He is tolerating the 4-layer compression wraps at this point he did contact the pump company and they are actually working on getting him a new compression sleeve for one of his legs which have previously popped and was not functioning properly. 04-23-2022 upon evaluation today patient appears to be doing well currently in regard to his wounds on the legs. I am actually very pleased with where things stand and I do feel like that we are headed in the right direction. Fortunately there is no sign of active infection locally or systemically at this time.  05-07-2022 upon evaluation today patient appears to be doing well currently in regard to his wounds in fact things are showing signs of improvement which is good news I do not see too much that actually appears to be open and I am very pleased in that regard. No fevers, chills, nausea, vomiting, or diarrhea. Objective Constitutional Chronically ill appearing but in no apparent acute distress. Vitals Time Taken: 3:38 AM, Height: 74 in, Weight: 250 lbs, BMI: 32.1, Temperature: 98.0 F, Pulse: 102 bpm, Respiratory Rate: 20 breaths/min, Blood Pressure: 199/80 mmHg. Respiratory normal breathing without difficulty. Psychiatric this patient is able to make decisions and demonstrates good insight into disease process. Alert and Oriented x 3. pleasant and cooperative. General Notes: Upon inspection patient's wound bed actually showed signs of good granulation and epithelization at this point. Fortunately I see no signs of anything worsening which I am extremely pleased with. Overall I think that we are headed in the right direction is just a matter of time as far as getting this completely closed. The patient voiced understanding. Integumentary (Hair, Skin) Wound #6  status is Open. Original cause of wound was Gradually Appeared. The date acquired was: 03/05/2022. The wound has been in treatment 6 weeks. The wound is located on the Right,Medial Lower Leg. The wound measures 8cm length x 4cm width x 0.1cm depth; 25.133cm^2 area and 2.513cm^3 volume. There is Fat Layer (Subcutaneous Tissue) exposed. There is no tunneling or undermining noted. There is a medium amount of purulent drainage noted. The wound margin is distinct with the outline attached to the wound base. There is medium (34-66%) red, pink granulation within the wound bed. There is a medium (34-66%) amount of necrotic tissue within the wound bed including Adherent Slough. The periwound skin appearance did not exhibit: Callus, Crepitus, Excoriation, Induration, Rash, Scarring, Dry/Scaly, Maceration, Atrophie Blanche, Cyanosis, Ecchymosis, Hemosiderin Staining, Mottled, Pallor, Rubor, Erythema. Periwound temperature was noted as No Abnormality. The periwound has tenderness on palpation. Wound #7 status is Open. Original cause of wound was Gradually Appeared. The date acquired was: 03/05/2022. The wound has been in treatment 6 weeks. The wound is located on the Left,Medial Lower Leg. The wound measures 0.2cm length x 0.2cm width x 0.1cm depth; 0.031cm^2 area and 0.003cm^3 volume. There is no tunneling or undermining noted. There is a medium amount of serosanguineous drainage noted. The wound margin is distinct with the outline attached to the wound base. There is medium (34-66%) red, pink granulation within the wound bed. There is a medium (34-66%) amount of necrotic tissue within the wound bed including Adherent Slough. Periwound temperature was noted as No Abnormality. The periwound has tenderness on palpation. 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 There was a Four Layer Compression Therapy Procedure by Rhae Hammock, RN. Post procedure Diagnosis Wound #6: Same as Pre-Procedure Wound #7 Pre-procedure diagnosis of Wound #7 is a Lymphedema located on the Left,Medial Lower Leg . There was a Four Layer Compression Therapy Procedure by Rhae Hammock, RN. Post procedure Diagnosis Wound #7: Same as Pre-Procedure Plan Follow-up Appointments: Return Appointment in 2 weeks. Burman Blacksmith on Wednesday's Nurse Visit: - Next Wednesday 05/14/22 Other: - Ensure to use your  lymphedema pumps once your new sleeve comes in. Bathing/ Shower/ Hygiene: May shower with protection but do not get wound dressing(s) wet. Edema Control - Lymphedema / SCD / Other: Lymphedema Pumps. Use Lymphedema pumps on leg(s) 2-3 times a day for 45-60 minutes. If wearing any wraps or hose, do not remove them. Continue exercising as instructed. Elevate legs to the level of the heart or above for 30 minutes daily and/or when sitting, a frequency of: Avoid standing for long periods of time. WOUND #6: - Lower Leg Wound Laterality: Right, Medial Cleanser: Soap and Water 1 x Per Week/7 Days Discharge Instructions: May shower and wash wound with dial antibacterial soap and water prior to dressing change. Peri-Wound Care: Sween Lotion (Moisturizing lotion) 1 x Per Week/7 Days Discharge Instructions: Apply moisturizing lotion as directed Prim Dressing: KerraCel Ag Gelling Fiber Dressing, 4x5 in (silver alginate) 1 x Per Week/7 Days ary Discharge Instructions: Apply silver alginate to wound bed as instructed Secondary Dressing: ABD Pad, 5x9 1 x Per Week/7 Days Discharge Instructions: Apply over primary dressing as  directed. Secondary Dressing: Woven Gauze Sponge, Non-Sterile 4x4 in 1 x Per Week/7 Days Discharge Instructions: Apply over primary dressing as directed. Com pression Wrap: FourPress (4 layer compression wrap) 1 x Per Week/7 Days Discharge Instructions: Apply four layer compression as directed. May also use Miliken CoFlex 2 layer compression system as alternative. WOUND #7: - Lower Leg Wound Laterality: Left, Medial Cleanser: Soap and Water 1 x Per Week/7 Days Discharge Instructions: May shower and wash wound with dial antibacterial soap and water prior to dressing change. Peri-Wound Care: Sween Lotion (Moisturizing lotion) 1 x Per Week/7 Days Discharge Instructions: Apply moisturizing lotion as directed Prim Dressing: KerraCel Ag Gelling Fiber Dressing, 4x5 in (silver alginate) 1 x Per Week/7 Days ary Discharge Instructions: Apply silver alginate to wound bed as instructed Secondary Dressing: ABD Pad, 5x9 1 x Per Week/7 Days Discharge Instructions: Apply over primary dressing as directed. Secondary Dressing: Woven Gauze Sponge, Non-Sterile 4x4 in 1 x Per Week/7 Days Discharge Instructions: Apply over primary dressing as directed. Com pression Wrap: FourPress (4 layer compression wrap) 1 x Per Week/7 Days Discharge Instructions: Apply four layer compression as directed. May also use Miliken CoFlex 2 layer compression system as alternative. 1. I am going to recommend based on what I am seeing that we go ahead and continue with the wound care measures as before and the patient is in agreement with that plan. This includes the use of the silver alginate dressing to the open areas that are weeping. 2. Also can recommend that we have the patient continue with the ABD pads to cover followed by the 4-layer compression wrap. We will see patient back for reevaluation in 1 week here in the clinic. If anything worsens or changes patient will contact our office for additional recommendations. This will be  a nurse visit and then we will see him back in 2 weeks for a provider visit. Electronic Signature(s) Signed: 05/07/2022 5:57:35 PM By: Worthy Keeler PA-C Entered By: Worthy Keeler on 05/07/2022 17:57:34 SuperBill Details -------------------------------------------------------------------------------- Aslin, Kasandra [name] ([id number]) [id number] Page 8 of 8 Patient Name: Date of Service: Hester, [name] 05/07/2022 Medical Record Number: [id number] Patient Account Number: [id number] Date of Birth/Sex: Treating RN: [birth year] (76 y.o. [name] Primary Care Provider: [name] Other Clinician: Referring Provider: Treating Provider/Extender: [name] Weeks in Treatment: 6 Diagnosis Coding ICD-10 Codes Code Description [phone] Non-pressure chronic ulcer of other  part of left lower leg with other specified severity 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 other part of right lower leg with other specified severity I89.0 Lymphedema, not elsewhere classified Facility Procedures CPT4 Description Modifier Quantity Code [id number] BILATERAL: Application of multi-layer venous compression system; leg (below knee), including ankle and 1 foot. Physician Procedures Quantity CPT4 Code Description Modifier [id number] - WC PHYS LEVEL 3 - EST PT 1 ICD-10 Diagnosis Description 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 other part of right lower leg with other specified severity I89.0 Lymphedema, not elsewhere classified Electronic Signature(s) Signed: 05/07/2022 5:57:47 PM By: Worthy Keeler PA-C Previous Signature: 05/07/2022 5:39:23 PM Version By: Rhae Hammock RN Entered By: Worthy Keeler on 05/07/2022 17:57:47

## 2022-05-08 NOTE — Progress Notes (Signed)
[name] [name] ([id number]) [id number] Page 1 of 8 Visit Report for 05/07/2022 Arrival Information Details Patient Name: Date of Service: [name] [name] 05/07/2022 2:45 PM Medical Record Number: [id number] Patient Account Number: [id number] Date of Birth/Sex: Treating RN: [birth year] (76 y.o. M) Primary Care Lillee Mooneyhan: [name] Other Clinician: Referring Amar Sippel: Treating Ladarrius Bogdanski/Extender: [name] in Treatment: 6 Visit Information History Since Last Visit All ordered tests and consults were completed: No Patient Arrived: [name] [name] Added or deleted any medications: No Arrival Time: 15:38 Any new allergies or adverse reactions: No Accompanied By: self Had a fall or experienced change in No Transfer Assistance: None activities of daily living that may affect Patient Identification Verified: Yes risk of falls: Secondary Verification Process Completed: Yes Signs or symptoms of abuse/neglect since last visito No Patient Requires Transmission-Based Precautions: No Hospitalized since last visit: No Patient Has Alerts: Yes Implantable device outside of the clinic excluding No Patient Alerts: Patient on Blood Thinner cellular tissue based products placed in the center Right ABI in clinic [place] since last visit: Pain Present Now: No Electronic Signature(s) Signed: 05/07/2022 4:01:13 PM By: Worthy Rancher Entered By: Worthy Rancher on 05/07/2022 15:38:44 -------------------------------------------------------------------------------- Compression Therapy Details Patient Name: Date of Service: [name] [name] 05/07/2022 2:45 PM Medical Record Number: [id number] Patient Account Number: [id number] Date of Birth/Sex: Treating RN: Mar 25, 1946 (76 y.o. [name] Primary Care Lando Alcalde: [name] Other Clinician: Referring Carmalita Wakefield: Treating Lydiana Milley/Extender: [name] Weeks in Treatment:  6 Compression Therapy Performed for Wound Assessment: Wound #6 Right,Medial Lower Leg Performed By: Clinician Rhae Hammock, RN Compression Type: Four Layer Post Procedure Diagnosis Same as Pre-procedure Electronic Signature(s) Signed: 05/07/2022 5:39:23 PM By: Rhae Hammock RN Entered By: Rhae Hammock on 05/07/2022 16:37:20 Harvie, [name] [name] ([id number]) [id number] Page 2 of 8 -------------------------------------------------------------------------------- Compression Therapy Details Patient Name: Date of Service: [name] [name] 05/07/2022 2:45 PM Medical Record Number: [id number] Patient Account Number: [id number] Date of Birth/Sex: Treating RN: [birth year] (76 y.o. [name], [name] Primary Care Jasminemarie Sherrard: [name] Other Clinician: Referring Adaria Hole: Treating Yetunde Leis/Extender: [name] Weeks in Treatment: 6 Compression Therapy Performed for Wound Assessment: Wound #7 Left,Medial Lower Leg Performed By: Clinician Rhae Hammock, RN Compression Type: Four Layer Post Procedure Diagnosis Same as Pre-procedure Electronic Signature(s) Signed: 05/07/2022 5:39:23 PM By: Rhae Hammock RN Entered By: Rhae Hammock on 05/07/2022 16:37:20 -------------------------------------------------------------------------------- Encounter Discharge Information Details Patient Name: Date of Service: [name] [name] 05/07/2022 2:45 PM Medical Record Number: [id number] Patient Account Number: [id number] Date of Birth/Sex: Treating RN: November 25, 1945 (76 y.o. [name], [name] Primary Care Saba Gomm: [name] Other Clinician: Referring Reeves Musick: Treating Keia Rask/Extender: [name] in Treatment: 6 Encounter Discharge Information Items Discharge Condition: Stable Ambulatory Status: Ambulatory Discharge Destination: Home Transportation: Private Auto Accompanied By: self Schedule  Follow-up Appointment: Yes Clinical Summary of Care: Patient Declined Electronic Signature(s) Signed: 05/07/2022 5:39:23 PM By: Rhae Hammock RN Entered By: Rhae Hammock on 05/07/2022 16:37:58 -------------------------------------------------------------------------------- Lower Extremity Assessment Details Patient Name: Date of Service: [name] [name] [name] 05/07/2022 2:45 PM Medical Record Number: [id number] Patient Account Number: [id number] Date of Birth/Sex: Treating RN: 1945/11/20 (76 y.o. [name] Primary Care Rise Traeger: [name] Other Clinician: Referring Mariabelen Pressly: Treating Jahad Old/Extender: [name], [name] Weeks in Treatment: 6 Edema Assessment Assessed: [Left: Yes] [Right: Yes] Edema: [Left: Yes] [Right: Yes] [name] [name] ([id number]) [id number] Page 3 of 8 Left: Right: Point of Measurement:  40 cm From Medial Instep 56.5 cm 58 cm Ankle Left: Right: Point of Measurement: 13 cm From Medial Instep 39.8 cm 39 cm Vascular Assessment Pulses: Dorsalis Pedis Palpable: [Left:Yes] [Right:Yes] Posterior Tibial Palpable: [Left:Yes] [Right:Yes] Electronic Signature(s) Signed: 05/07/2022 5:39:23 PM By: Rhae Hammock RN Entered By: Rhae Hammock on 05/07/2022 16:11:15 -------------------------------------------------------------------------------- Multi-Disciplinary Care Plan Details Patient Name: Date of Service: [name] [name] 05/07/2022 2:45 PM Medical Record Number: [id number] Patient Account Number: [id number] Date of Birth/Sex: Treating RN: 1946-02-08 (76 y.o. [name], [name] Primary Care Javed Cotto: [name] Other Clinician: Referring Semiyah Newgent: Treating Erica Osuna/Extender: [name] Weeks in Treatment: 6 Active Inactive Wound/Skin Impairment Nursing Diagnoses: Impaired tissue integrity Knowledge deficit related to ulceration/compromised skin  integrity Goals: Patient will have a decrease in wound volume by X% from date: (specify in notes) Date Initiated: 03/26/2022 Target Resolution Date: 05/17/2022 Goal Status: Active Patient/caregiver will verbalize understanding of skin care regimen Date Initiated: 03/26/2022 Target Resolution Date: 05/17/2022 Goal Status: Active Ulcer/skin breakdown will have a volume reduction of 30% by week 4 Date Initiated: 03/26/2022 Target Resolution Date: 05/17/2022 Goal Status: Active Ulcer/skin breakdown will have a volume reduction of 50% by week 8 Date Initiated: 03/26/2022 Target Resolution Date: 05/17/2022 Goal Status: Active Interventions: Assess patient/caregiver ability to obtain necessary supplies Assess patient/caregiver ability to perform ulcer/skin care regimen upon admission and as needed Assess ulceration(s) every visit Notes: Electronic Signature(s) Signed: 05/07/2022 5:39:23 PM By: Rhae Hammock RN Entered By: Rhae Hammock on 05/07/2022 16:13:40 Rinks, [name] [name] ([id number]) [id number] Page 4 of 8 -------------------------------------------------------------------------------- Pain Assessment Details Patient Name: Date of Service: [name] [name] 05/07/2022 2:45 PM Medical Record Number: [id number] Patient Account Number: [id number] Date of Birth/Sex: Treating RN: 03/02/1946 (76 y.o. M) Primary Care Marabelle Cushman: [name] Other Clinician: Referring Teller Wakefield: Treating Roda Lauture/Extender: [name] Weeks in Treatment: 6 Active Problems Location of Pain Severity and Description of Pain Patient Has Paino No Site Locations Pain Management and Medication Current Pain Management: Electronic Signature(s) Signed: 05/07/2022 4:01:13 PM By: Worthy Rancher Entered By: Worthy Rancher on 05/07/2022 15:39:17 -------------------------------------------------------------------------------- Patient/Caregiver Education  Details Patient Name: Date of Service: [name] [name] [name] 12/20/2023andnbsp2:45 PM Medical Record Number: [id number] Patient Account Number: [id number] Date of Birth/Gender: Treating RN: 06/10/1945 (76 y.o. [name] Primary Care Physician: [name] Other Clinician: Referring Physician: Treating Physician/Extender: [name] in Treatment: 6 Education Assessment Education Provided To: Patient Education Topics Provided Wound/Skin Impairment: Methods: Explain/Verbal Responses: Reinforcements needed, State content correctly [name] [name] ([id number]) [id number] Page 5 of 8 Electronic Signature(s) Signed: 05/07/2022 5:39:23 PM By: Rhae Hammock RN Entered By: Rhae Hammock on 05/07/2022 16:14:01 -------------------------------------------------------------------------------- Wound Assessment Details Patient Name: Date of Service: [name] [name] [name] 05/07/2022 2:45 PM Medical Record Number: [id number] Patient Account Number: [id number] Date of Birth/Sex: Treating RN: [birth year] (76 y.o. [name], [name] Primary Care Zoeie Ritter: [name] Other Clinician: Referring Alexsandria Kivett: Treating Brielle Moro/Extender: [name] Weeks in Treatment: 6 Wound Status Wound Number: 6 Primary Lymphedema Etiology: Wound Location: Right, Medial Lower Leg Wound Open Wounding Event: Gradually Appeared Status: Date Acquired: 03/05/2022 Comorbid Anemia, Lymphedema, Deep Vein Thrombosis, Hypertension, Weeks Of Treatment: 6 History: Received Radiation Clustered Wound: No Wound Measurements Length: (cm) 8 Width: (cm) 4 Depth: (cm) 0.1 Area: (cm) 25.133 Volume: (cm) 2.513 % Reduction in Area: 82.4% % Reduction in Volume: 82.4% Epithelialization: None Tunneling: No Undermining: No Wound Description Classification: Full Thickness Without Exposed Suppor Wound Margin:  Distinct, outline  attached Exudate Amount: Medium Exudate Type: Purulent Exudate Color: yellow, brown, green t Structures Foul Odor After Cleansing: No Slough/Fibrino Yes Wound Bed Granulation Amount: Medium (34-66%) Exposed Structure Granulation Quality: Red, [name] Fascia Exposed: No Necrotic Amount: Medium (34-66%) Fat Layer (Subcutaneous Tissue) Exposed: Yes Necrotic Quality: Adherent Slough Tendon Exposed: No Muscle Exposed: No Joint Exposed: No Bone Exposed: No Periwound Skin Texture Texture Color No Abnormalities Noted: No No Abnormalities Noted: No Callus: No Atrophie Blanche: No Crepitus: No Cyanosis: No Excoriation: No Ecchymosis: No Induration: No Erythema: No Rash: No Hemosiderin Staining: No Scarring: No Mottled: No Pallor: No Moisture Rubor: No No Abnormalities Noted: No Dry / Scaly: No Temperature / Pain Maceration: No Temperature: No Abnormality Tenderness on Palpation: Yes Treatment Notes Wound #6 (Lower Leg) Wound Laterality: Right, Medial Cleanser Crum, Chozen ([id number]) [id number] Page 6 of 8 Soap and Water Discharge Instruction: May shower and wash wound with dial antibacterial soap and water prior to dressing change. Peri-Wound Care Sween Lotion (Moisturizing lotion) Discharge Instruction: Apply moisturizing lotion as directed Topical Primary Dressing KerraCel Ag Gelling Fiber Dressing, 4x5 in (silver alginate) Discharge Instruction: Apply silver alginate to wound bed as instructed Secondary Dressing ABD Pad, 5x9 Discharge Instruction: Apply over primary dressing as directed. Woven Gauze Sponge, Non-Sterile 4x4 in Discharge Instruction: Apply over primary dressing as directed. Secured With Compression Wrap FourPress (4 layer compression wrap) Discharge Instruction: Apply four layer compression as directed. May also use Miliken CoFlex 2 layer compression system as alternative. Compression Stockings Add-Ons Electronic  Signature(s) Signed: 05/07/2022 5:39:23 PM By: Rhae Hammock RN Entered By: Rhae Hammock on 05/07/2022 16:12:34 -------------------------------------------------------------------------------- Wound Assessment Details Patient Name: Date of Service: [name] [name] 05/07/2022 2:45 PM Medical Record Number: [id number] Patient Account Number: [id number] Date of Birth/Sex: Treating RN: 05-Apr-1946 (76 y.o. [name], [name] Primary Care Alonia Dibuono: [name] Other Clinician: Referring Jazilyn Siegenthaler: Treating Tyshan Enderle/Extender: [name] Weeks in Treatment: 6 Wound Status Wound Number: 7 Primary Lymphedema Etiology: Wound Location: Left, Medial Lower Leg Wound Open Wounding Event: Gradually Appeared Status: Date Acquired: 03/05/2022 Comorbid Anemia, Lymphedema, Deep Vein Thrombosis, Hypertension, Weeks Of Treatment: 6 History: Received Radiation Clustered Wound: No Wound Measurements Length: (cm) 0.2 Width: (cm) 0.2 Depth: (cm) 0.1 Area: (cm) 0.031 Volume: (cm) 0.003 % Reduction in Area: 99.9% % Reduction in Volume: 99.9% Epithelialization: Small (1-33%) Tunneling: No Undermining: No Wound Description Classification: Full Thickness Without Exposed Support Structures Wound Margin: Distinct, outline attached Exudate Amount: Medium Exudate Type: Serosanguineous Exudate Color: red, brown Foul Odor After Cleansing: No Slough/Fibrino Yes Wound Bed Granulation Amount: Medium (34-66%) Exposed Structure [name] [name] ([id number]) [id number] Page 7 of 8 Granulation Quality: Red, [name] Fascia Exposed: No Necrotic Amount: Medium (34-66%) Fat Layer (Subcutaneous Tissue) Exposed: No Necrotic Quality: Adherent Slough Tendon Exposed: No Muscle Exposed: No Joint Exposed: No Bone Exposed: No Periwound Skin Texture Texture Color No Abnormalities Noted: No No Abnormalities Noted: No Moisture Temperature / Pain No  Abnormalities Noted: No Temperature: No Abnormality Tenderness on Palpation: Yes Treatment Notes Wound #7 (Lower Leg) Wound Laterality: Left, Medial Cleanser Soap and Water Discharge Instruction: May shower and wash wound with dial antibacterial soap and water prior to dressing change. Peri-Wound Care Sween Lotion (Moisturizing lotion) Discharge Instruction: Apply moisturizing lotion as directed Topical Primary Dressing KerraCel Ag Gelling Fiber Dressing, 4x5 in (silver alginate) Discharge Instruction: Apply silver alginate to wound bed as instructed Secondary Dressing ABD Pad, 5x9 Discharge Instruction: Apply over primary dressing as directed. Woven Gauze  Sponge, Non-Sterile 4x4 in Discharge Instruction: Apply over primary dressing as directed. Secured With Compression Wrap FourPress (4 layer compression wrap) Discharge Instruction: Apply four layer compression as directed. May also use Miliken CoFlex 2 layer compression system as alternative. Compression Stockings Add-Ons Electronic Signature(s) Signed: 05/07/2022 5:39:23 PM By: Rhae Hammock RN Entered By: Rhae Hammock on 05/07/2022 16:13:15 -------------------------------------------------------------------------------- Vitals Details Patient Name: Date of Service: [name] [name] 05/07/2022 2:45 PM Medical Record Number: [id number] Patient Account Number: [id number] Date of Birth/Sex: Treating RN: 16-May-1946 (76 y.o. M) Primary Care Hebah Bogosian: [name] Other Clinician: Referring Margaretta Chittum: Treating Bryley Kovacevic/Extender: [name] Weeks in Treatment: 6 Vital Signs Time Taken: 03:38 Temperature (F): 98.0 Height (in): 74 Pulse (bpm): 102 Weight (lbs): 250 Respiratory Rate (breaths/min): 20 Body Mass Index (BMI): 32.1 Blood Pressure (mmHg): 199/80 Torregrossa, [name] [name] ([id number]) [id number] Page 8 of 8 Reference Range: 80 - 120 mg / dl Electronic  Signature(s) Signed: 05/07/2022 4:01:13 PM By: Worthy Rancher Entered By: Worthy Rancher on 05/07/2022 15:39:06

## 2022-05-14 ENCOUNTER — Encounter (HOSPITAL_BASED_OUTPATIENT_CLINIC_OR_DEPARTMENT_OTHER): Payer: Medicare Other | Admitting: General Surgery

## 2022-05-14 DIAGNOSIS — L97828 Non-pressure chronic ulcer of other part of left lower leg with other specified severity: Secondary | ICD-10-CM | POA: Diagnosis not present

## 2022-05-14 NOTE — Progress Notes (Signed)
[name], [name] ([id number]) [id number] Page 1 of 1 Visit Report for 05/14/2022 SuperBill Details Patient Name: Date of Service: [name], [name] 05/14/2022 Medical Record Number: [id number] Patient Account Number: [id number] Date of Birth/Sex: Treating RN: 1945-10-28 (76 y.o. 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 Electronic Signature(s) Signed: 05/14/2022 12:22:10 PM By: Sharyn Creamer RN, [name] Signed: 05/14/2022 12:29:27 PM By: Fredirick Maudlin MD FACS Entered By: Sharyn Creamer on 05/14/2022 12:21:07

## 2022-05-14 NOTE — Progress Notes (Signed)
[place], [name] ([id number]) [id number] Page 1 of 5 Visit Report for 05/14/2022 Arrival Information Details Patient Name: Date of Service: Skyles, [name] 05/14/2022 11:00 A M Medical Record Number: [id number] Patient Account Number: [id number] Date of Birth/Sex: Treating RN: 18-Mar-1946 (76 y.o. Mare Ferrari Primary Care Wendy Mikles: [name] Other Clinician: Referring Billal Rollo: Treating Stefan Karen/Extender: Barnett Applebaum Weeks in Treatment: 7 Visit Information History Since Last Visit Added or deleted any medications: No Patient Arrived: [name] Any new allergies or adverse reactions: No Arrival Time: 12:18 Had a fall or experienced change in No Accompanied By: daughter activities of daily living that may affect Transfer Assistance: None risk of falls: Patient Identification Verified: Yes Signs or symptoms of abuse/neglect since last visito No Secondary Verification Process Completed: Yes Hospitalized since last visit: No Patient Requires Transmission-Based Precautions: No Implantable device outside of the clinic excluding No Patient Has Alerts: Yes cellular tissue based products placed in the center Patient Alerts: Patient on Blood Thinner since last visit: Right ABI in clinic [place] Has Dressing in Place as Prescribed: Yes Has Compression in Place as Prescribed: Yes Pain Present Now: No Electronic Signature(s) Signed: 05/14/2022 12:22:10 PM By: Sharyn Creamer RN, [name] Entered By: Sharyn Creamer on 05/14/2022 12:19:26 -------------------------------------------------------------------------------- Compression Therapy Details Patient Name: Date of Service: Bozarth, [name] 05/14/2022 11:00 A M Medical Record Number: [id number] Patient Account Number: [id number] Date of Birth/Sex: Treating RN: 1946-01-04 (76 y.o. Mare Ferrari Primary Care Judeen Geralds: [name] Other Clinician: Referring Madylyn Insco: Treating  Judea Riches/Extender: Barnett Applebaum Weeks in Treatment: 7 Compression Therapy Performed for Wound Assessment: Wound #6 Right,Medial Lower Leg Performed By: Clinician Sharyn Creamer, RN Compression Type: Four Layer Electronic Signature(s) Signed: 05/14/2022 12:22:10 PM By: Sharyn Creamer RN, [name] Entered By: Sharyn Creamer on 05/14/2022 12:20:01 -------------------------------------------------------------------------------- Compression Therapy Details Patient Name: Date of Service: Pardee, [name] 05/14/2022 11:00 A M Medical Record Number: [id number] Patient Account Number: [id number] Leffel, [name] ([id number]) [id number] Page 2 of 5 Date of Birth/Sex: Treating RN: Dec 02, 1945 (76 y.o. Mare Ferrari Primary Care Makinzee Durley: Other Clinician: [name] Referring [name]: Treating Iain Sawchuk/Extender: Barnett Applebaum Weeks in Treatment: 7 Compression Therapy Performed for Wound Assessment: Wound #7 Left,Medial Lower Leg Performed By: Clinician Sharyn Creamer, RN Compression Type: Four Layer Electronic Signature(s) Signed: 05/14/2022 12:22:10 PM By: Sharyn Creamer RN, [name] Entered By: Sharyn Creamer on 05/14/2022 12:20:01 -------------------------------------------------------------------------------- Encounter Discharge Information Details Patient Name: Date of Service: Schuld, [name] 05/14/2022 11:00 A M Medical Record Number: [id number] Patient Account Number: [id number] Date of Birth/Sex: Treating RN: Feb 06, 1946 (76 y.o. Mare Ferrari Primary Care Polk Minor: [name] Other Clinician: Referring Amar Keenum: Treating Jolyne Laye/Extender: Particia Nearing in Treatment: 7 Encounter Discharge Information Items Discharge Condition: Stable Ambulatory Status: Cane Discharge Destination: Home Transportation: Private Auto Accompanied By: daughter Schedule Follow-up Appointment:  Yes Clinical Summary of Care: Patient Declined Electronic Signature(s) Signed: 05/14/2022 12:22:10 PM By: Sharyn Creamer RN, [name] Entered By: Sharyn Creamer on 05/14/2022 12:20:45 -------------------------------------------------------------------------------- Patient/Caregiver Education Details Patient Name: Date of Service: Zurita, [name] 12/27/2023andnbsp11:00 A M Medical Record Number: [id number] Patient Account Number: [id number] Date of Birth/Gender: Treating RN: [birth year] (76 y.o. Mare Ferrari Primary Care Physician: [name] Other Clinician: Referring Physician: Treating Physician/Extender: Particia Nearing in Treatment: 7 Education Assessment Education Provided To: Patient Education Topics Provided Wound/Skin Impairment: Methods: Explain/Verbal Responses: State content correctly Electronic Signature(s) Shinn, [name] ([id number]) [phone].pdf Page 3 of 5  Signed: 05/14/2022 12:22:10 PM By: Sharyn Creamer RN, [name] Entered By: Sharyn Creamer on 05/14/2022 12:20:25 -------------------------------------------------------------------------------- Wound Assessment Details Patient Name: Date of Service: [name], [name] 05/14/2022 11:00 A M Medical Record Number: [id number] Patient Account Number: [id number] Date of Birth/Sex: Treating RN: July 31, 1945 (76 y.o. Mare Ferrari Primary Care Kenyana Husak: [name] Other Clinician: Referring Kasey Hansell: Treating Masaji Billups/Extender: Barnett Applebaum Weeks in Treatment: 7 Wound Status Wound Number: 6 Primary Etiology: Lymphedema Wound Location: Right, Medial Lower Leg Wound Status: Open Wounding Event: Gradually Appeared Date Acquired: 03/05/2022 Weeks Of Treatment: 7 Clustered Wound: No Wound Measurements Length: (cm) 8 Width: (cm) 4.4 Depth: (cm) 0.1 Area: (cm) 27.646 Volume: (cm) 2.765 % Reduction in Area: 80.7% % Reduction in Volume:  80.7% Wound Description Classification: Full Thickness Without Exposed Suppor Exudate Amount: Medium Exudate Type: Purulent Exudate Color: yellow, brown, green t Structures Periwound Skin Texture Texture Color No Abnormalities Noted: No No Abnormalities Noted: No Moisture No Abnormalities Noted: No Treatment Notes Wound #6 (Lower Leg) Wound Laterality: Right, Medial Cleanser Soap and Water Discharge Instruction: May shower and wash wound with dial antibacterial soap and water prior to dressing change. Peri-Wound Care Sween Lotion (Moisturizing lotion) Discharge Instruction: Apply moisturizing lotion as directed Topical Primary Dressing KerraCel Ag Gelling Fiber Dressing, 4x5 in (silver alginate) Discharge Instruction: Apply silver alginate to wound bed as instructed Secondary Dressing ABD Pad, 5x9 Discharge Instruction: Apply over primary dressing as directed. Woven Gauze Sponge, Non-Sterile 4x4 in Discharge Instruction: Apply over primary dressing as directed. Secured With [employer] Stepney, [name] ([id number]) [phone].pdf Page 4 of 5 FourPress (4 layer compression wrap) Discharge Instruction: Apply four layer compression as directed. May also use Miliken CoFlex 2 layer compression system as alternative. Compression Stockings Add-Ons Electronic Signature(s) Signed: 05/14/2022 12:22:10 PM By: Sharyn Creamer RN, [name] Entered By: Sharyn Creamer on 05/14/2022 12:19:43 -------------------------------------------------------------------------------- Wound Assessment Details Patient Name: Date of Service: Gethers, [name] 05/14/2022 11:00 A M Medical Record Number: [id number] Patient Account Number: [id number] Date of Birth/Sex: Treating RN: 1945/06/02 (76 y.o. Mare Ferrari Primary Care Marquez Ceesay: [name] Other Clinician: Referring Mashayla Lavin: Treating Sullivan Jacuinde/Extender: Barnett Applebaum Weeks in Treatment: 7 Wound  Status Wound Number: 7 Primary Etiology: Lymphedema Wound Location: Left, Medial Lower Leg Wound Status: Open Wounding Event: Gradually Appeared Date Acquired: 03/05/2022 Weeks Of Treatment: 7 Clustered Wound: No Wound Measurements Length: (cm) 0.2 Width: (cm) 0.2 Depth: (cm) 0.1 Area: (cm) 0.031 Volume: (cm) 0.003 % Reduction in Area: 99.9% % Reduction in Volume: 99.9% Wound Description Classification: Full Thickness Without Exposed Suppor Exudate Amount: Medium Exudate Type: Serosanguineous Exudate Color: red, brown t Structures Periwound Skin Texture Texture Color No Abnormalities Noted: No No Abnormalities Noted: No Moisture No Abnormalities Noted: No Treatment Notes Wound #7 (Lower Leg) Wound Laterality: Left, Medial Cleanser Soap and Water Discharge Instruction: May shower and wash wound with dial antibacterial soap and water prior to dressing change. Peri-Wound Care Sween Lotion (Moisturizing lotion) Discharge Instruction: Apply moisturizing lotion as directed Topical Primary Dressing KerraCel Ag Gelling Fiber Dressing, 4x5 in (silver alginate) Discharge Instruction: Apply silver alginate to wound bed as instructed Secondary Dressing [place], [name] ([id number]) [id number] Page 5 of 5 ABD Pad, 5x9 Discharge Instruction: Apply over primary dressing as directed. Woven Gauze Sponge, Non-Sterile 4x4 in Discharge Instruction: Apply over primary dressing as directed. Secured With Compression Wrap FourPress (4 layer compression wrap) Discharge Instruction: Apply four layer compression as directed. May also use Miliken CoFlex 2 layer compression system  as alternative. Compression Stockings Add-Ons Electronic Signature(s) Signed: 05/14/2022 12:22:10 PM By: Sharyn Creamer RN, [name] Entered By: Sharyn Creamer on 05/14/2022 12:19:43

## 2022-05-21 ENCOUNTER — Encounter (HOSPITAL_BASED_OUTPATIENT_CLINIC_OR_DEPARTMENT_OTHER): Payer: Medicare Other | Attending: Physician Assistant | Admitting: Physician Assistant

## 2022-05-21 DIAGNOSIS — L97828 Non-pressure chronic ulcer of other part of left lower leg with other specified severity: Secondary | ICD-10-CM | POA: Insufficient documentation

## 2022-05-21 DIAGNOSIS — I890 Lymphedema, not elsewhere classified: Secondary | ICD-10-CM | POA: Diagnosis present

## 2022-05-21 DIAGNOSIS — L97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 other part of right lower leg with other specified severity: Secondary | ICD-10-CM | POA: Insufficient documentation

## 2022-05-21 DIAGNOSIS — I87333 Chronic venous hypertension (idiopathic) with ulcer and inflammation of bilateral lower extremity: Secondary | ICD-10-CM | POA: Diagnosis not present

## 2022-05-21 NOTE — Progress Notes (Signed)
[name], [name] ([id number]) [id number] Page 1 of 8 Visit Report for 05/21/2022 Chief Complaint Document Details Patient Name: Date of Service: [name] 05/21/2022 1:00 PM Medical Record Number: [id number] Patient Account Number: [id number] Date of Birth/Sex: Treating RN: 04-May-1946 (77 y.o. M) Primary Care Provider: Birdie [name] Other Clinician: Referring Provider: Treating Provider/Extender: [name] in Treatment: 8 Information Obtained from: Patient Chief Complaint 02/23/2020; patient is here for wounds on his bilateral lower legs in the setting of severe lymphedema 03/26/2022; patient is here for wounds on his bilateral lower legs medial aspect Electronic Signature(s) Signed: 05/21/2022 2:00:47 PM By: Worthy Keeler PA-C Entered By: Worthy Keeler on 05/21/2022 14:00:46 -------------------------------------------------------------------------------- HPI Details Patient Name: Date of Service: [name] 05/21/2022 1:00 PM Medical Record Number: [id number] Patient Account Number: [id number] Date of Birth/Sex: Treating RN: 08-29-1945 (77 y.o. M) Primary Care Provider: Birdie [name] Other Clinician: Referring Provider: Treating Provider/Extender: [name] in Treatment: 8 History of Present Illness HPI Description: ADMISSION 02/23/2020 Patient is a 77 year old man who lives in [place] who arrives accompanied by his wife. He has a history of chronic lymphedema and venous insufficiency in his bilateral lower legs which may have something to do that with having a history of DVT as well as being treated for prostate cancer. In any case he recently got compression pumps at home but compliance has been an issue here. He has compression stockings however they are probably not sufficient enough to control swelling. They tell us that things deteriorated for him in late August he was admitted  to [facility] for 7 days. This was with cellulitis I think of his bilateral lower legs. Discharge he was noted to have wounds on his bilateral lower legs. He was discharged on Bactrim. They tried to get him home health through [facility] of course they declined him. His wife is been wrapping these applying some form of silver foam dressing. He has a history of wounds before although nothing that would not heal with basic home topical dressings. He has 2 areas on the left medial, left anterior and left lateral and a smaller area on the right medial. All of these have considerable depth. Past medical history includes iron deficiency anemia, lymphedema followed by the rehab center at [facility] with lymphedema wraps I believe, DVT on chronic anticoagulation, prostate cancer, chronic venous insufficiency, hypertension. As mentioned he has compression pumps but does not use them. ABIs in our clinic were noncompressible bilaterally 10/14; patient with severe bilateral lymphedema right greater than left. He came in with bilateral lower extremity wounds left greater than right. Even though the right side has more of the edema most of the wounds here almost closed on the right medial. He has 3 remaining wounds on the left We have been using silver alginate under 4-layer compression I have been trying to get him to be compliant with his external compression pumps 10/21; patient with 3 small wounds on the left leg and 1 on the right medial in the setting of severe lymphedema and chronic venous insufficiency. We have been using silver alginate under 4-layer compression he is using his external compression pumps twice a day 11/4; ARTERIAL STUDIES on the right show an ABI of 1.02 TBI of 0.858 with biphasic waveforms on the left 0.98 with a TBI of 0.55 and biphasic waveforms. Does not look like he has significant arterial disease. We are treating  him for lymphedema he has compression pumps. He  has punched-out areas on the left anterior left lateral and right medial lower extremities 11/11; after we obtained his arterial studies I put him in 4 layer compression. He is using his compression pumps probably once a day although I have asked [name], [name] ([id number]) [phone].pdf Page 2 of 8 him to do twice. Primary dressing to the wound is silver collagen he has severe lymphedema likely secondary to chronic venous insufficiency. Wounds on the left lateral, left medial and left anterior and a small area on the right medial 12/2; the area on the right anterior lower leg has healed. We initially thought that the area medially had healed as well however when her discharge nurse came in she detected fluid in the wound simply opened up. This is actually worse than I remember this pain. The area on the left lateral potentially slightly smaller He is also complaining about pain in his left hand he says that this is actually been getting some better he has been using topical creams on this. She asked that I look at this 12/9 after last [name] issues we have 2 wounds one on the right medial lower leg and 1 on the left lateral. Both of these are in the same condition. I think because of thickened skin secondary to chronic lymphedema these wounds actually have depth of almost 0.8 cm. 12/16; the patient has 2 small but deep wounds one on the right medial and one on the left lateral. The right medial is actually the worst of these. He arrives in clinic today with absolutely terrible edema in the right leg apparently his 4-layer wrap fell down to just above his ankle he did not think about this he is apparently been continuing to use his compression pump twice a day. The left leg looks a lot better. 05/09/2020 upon evaluation today patient appears to be doing decently well in regard to his wounds. Everything is measuring smaller the right leg still has a little bit deeper wound in  the left seems to be almost completely healed in my opinion I am very pleased in general with how things are progressing. He has a 4- layer compression wrap we have been using endoform today we will probably have to use collagen just based on the fact that we do not have endoform it is on order. 1/6; the patient's wound on the left lateral lower leg has healed. Still has 1 on the right medial. 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 Using compression pumps at home twice a day. 1/13; left lateral lower leg is still healed. He has a deep punched out rectangular shaped wound on the right medial calf. Looking down at this it appears that he is attempting to epithelialize around the edges of the wound and on the base as well. His edema is reasonably well controlled we have been using collagen with absolutely no effect 1/20; left lateral lower leg remains closed he has extremitease stockings. The area on the right medial calf I aggressively debrided last week measures larger but the surface looks better. We have been using Hydrofera Blue. We ran Oasis through his insurance but we have not seen the results of this 1/27; left lower leg wound with chronic venous insufficiency and secondary lymphedema. I did aggressive debridement on this last week the wound seems to have come in healthy looking surface using Hydrofera Blue. He was denied for Oasis 2/3; small divot in the right medial  lower leg. Under illumination the walls of this divot are epithelialized however the base has slough which I removed with a curette we have been using Hydrofera Blue 2/10 small divot on the right medial lower leg pinpoint illumination at the base of this cone-shaped wound. We have been using Hydrofera Blue but I will switch to calcium alginate this week 2/17; the small divot on the right medial lower leg is fully epithelialized. There is no visible open area under illumination. He has his own stocking for the  right leg similar to the one he has been wearing on the left. 03/26/2022; READMISSION This is a now 77 year old man that we had in the clinic from 02/23/2020 through 07/05/2020. At that point he had bilateral lower extremity wounds left greater than right in the setting of severe lymphedema. He had already obtained compression pumps ordered for him I think from the wound care clinic in [facility] so I do not really have record of what he has been using. He claims to be using them once a day but there is a problem with the sleeve on the left leg. About 2 [name] ago he was hospitalized from 03/11/2022 through 09/62/8366 with diastolic congestive heart failure. 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 He was diuresed. Developed some prerenal azotemia and he has not been taking any diuretics currently. He has not been putting stockings on his legs since he got out of hospital and still has his legs dependent for long periods. Past medical history history of prostate cancer treated with prostatectomy and radiation this was apparently about 8 years ago, history of DVT on chronic Coumadin, history of lymphedema was managed for a while at the clinic in [place]. History of inguinal hernia repair in September 22, hypertension, stage IIIb chronic renal failure ABIs today were noncompressible on the right 1.12 on the left 04-02-2022 upon evaluation today patient appears to be doing well currently in regard to his legs I do feel like both areas that are draining are actually much drier than they were in the picture last week although the left is drier than the right. He is tolerating the 4-layer compression wraps at this point he did contact the pump company and they are actually working on getting him a new compression sleeve for one of his legs which have previously popped and was not functioning properly. 04-23-2022 upon evaluation today patient appears  to be doing well currently in regard to his wounds on the legs. I am actually very pleased with where things stand and I do feel like that we are headed in the right direction. Fortunately there is no sign of active infection locally or systemically at this time. 05-07-2022 upon evaluation today patient appears to be doing well currently in regard to his wounds in fact things are showing signs of improvement which is good news I do not see too much that actually appears to be open and I am very pleased in that regard. No fevers, chills, nausea, vomiting, or diarrhea. 05-21-2022 upon evaluation today patient appears to be doing somewhat poorly in regard to drainage of his lower extremities bilaterally. The right is greater than left as far as the weeping area. Nonetheless it seems to be getting worse not better. He actually has pitting edema which is at least 2+ to the thighs and I am concerned about the fact that he is may be fluid overloaded in general and that is the  reason why we cannot get this under control. I know he is not using his pumps all the time because he actually told the nurse that he was either going to pump or he was going to use his fluid pills but not do both. For that reason I do think that he needs to be really doing both in order to get the fluid out as effectively as possible obviously with the 4-layer compression wraps were doing as much as we can from a compression standpoint but it is really not enough. He tells me that he elevates his leg is much as he can in between pumping and other activity throughout the day. Electronic Signature(s) Signed: 05/21/2022 2:40:06 PM By: Worthy Keeler PA-C Entered By: Worthy Keeler on 05/21/2022 14:40:05 -------------------------------------------------------------------------------- Physical Exam Details Patient Name: Date of Service: Poarch, DA [name] 05/21/2022 1:00 PM Medical Record Number: [id number] Patient Account Number:  [id number] Vought, [name] ([id number]) [phone].pdf Page 3 of 8 Date of Birth/Sex: Treating RN: 01/03/1946 (77 y.o. M) Primary Care Provider: Other Clinician: Birdie [name] Referring Provider: Treating Provider/Extender: [name], [name] [name] in Treatment: 8 Constitutional Obese and well-hydrated in no acute distress. Respiratory normal breathing without difficulty. Psychiatric this patient is able to make decisions and demonstrates good insight into disease process. Alert and Oriented x 3. pleasant and cooperative. Notes Upon inspection patient's wounds again are more areas of weeping but nonetheless seem to be larger than they were last time I saw him 2 [name] ago. I feel like things are getting worse not better and I feel like that he needs to make some drastic changes. I do believe that he is supposed to be seeing his cardiologist soon and I am concerned about the possibility of congestive heart failure he denies this but again his edema and in general swelling just seems to be much more significant than what I would expect just from a simple chronic venous stasis. Electronic Signature(s) Signed: 05/21/2022 2:40:38 PM By: Worthy Keeler PA-C Entered By: Worthy Keeler on 05/21/2022 14:40:38 -------------------------------------------------------------------------------- Physician Orders Details Patient Name: Date of Service: Klosowski, DA [name] 05/21/2022 1:00 PM Medical Record Number: [id number] Patient Account Number: [id number] Date of Birth/Sex: Treating RN: March 28, 1946 (77 y.o. [name], [name] Primary Care Provider: Birdie [name] Other Clinician: Referring Provider: Treating Provider/Extender: [name] in Treatment: 8 Verbal / Phone Orders: No Diagnosis Coding ICD-10 Coding Code Description [phone] Non-pressure chronic ulcer of other part of left lower leg with other specified severity L97.818  Non-pressure chronic ulcer of other part of right lower leg with other specified severity I89.0 Lymphedema, not elsewhere classified Follow-up Appointments ppointment in 1 week. Margarita Grizzle on Wednesdays. Return A ppointment in 2 [name]. Burman Blacksmith on Wednesday's Return A Other: - ****ask Cardiology about fluid control in both legs- thighs and lower legs. Ensure to ask how much to drink fluids. Bathing/ Shower/ Hygiene May shower with protection but do not get wound dressing(s) wet. Protect dressing(s) with water repellant cover (for example, large plastic bag) or a cast cover and may then take shower. Edema Control - Lymphedema / SCD / Other Lymphedema Pumps. Use Lymphedema pumps on leg(s) 2-3 times a day for 45-60 minutes. If wearing any wraps or hose, do not remove them. Continue exercising as instructed. Elevate legs to the level of the heart or above for 30 minutes daily and/or when sitting for 3-4 times a day throughout the day. Avoid standing  for long periods of time. Exercise regularly Wound Treatment Wound #6 - Lower Leg Wound Laterality: Right, Medial Cleanser: Soap and Water 1 x Per Week/7 Days Discharge Instructions: May shower and wash wound with dial antibacterial soap and water prior to dressing change. Peri-Wound Care: Sween Lotion (Moisturizing lotion) 1 x Per Week/7 Days Discharge Instructions: Apply moisturizing lotion as directed Prim Dressing: Hydrofera Blue Ready Transfer Foam, 8x8 (in/in) ary 1 x Per Week/7 Days Bejarano, Donavin ([id number]) [id number] Page 4 of 8 Discharge Instructions: Apply to wound bed as instructed Secondary Dressing: ABD Pad, 5x9 1 x Per Week/7 Days Discharge Instructions: Apply over primary dressing as directed. Secondary Dressing: Woven Gauze Sponge, Non-Sterile 4x4 in 1 x Per Week/7 Days Discharge Instructions: Apply over primary dressing as directed. Compression Wrap: FourPress (4 layer compression wrap) 1 x Per  Week/7 Days Discharge Instructions: Apply four layer compression as directed. May also use Miliken CoFlex 2 layer compression system as alternative. Wound #7 - Lower Leg Wound Laterality: Left, Medial Cleanser: Soap and Water 1 x Per Week/7 Days Discharge Instructions: May shower and wash wound with dial antibacterial soap and water prior to dressing change. Peri-Wound Care: Sween Lotion (Moisturizing lotion) 1 x Per Week/7 Days Discharge Instructions: Apply moisturizing lotion as directed Prim Dressing: Hydrofera Blue Ready Transfer Foam, 8x8 (in/in) 1 x Per Week/7 Days ary Discharge Instructions: Apply to wound bed as instructed Secondary Dressing: ABD Pad, 5x9 1 x Per Week/7 Days Discharge Instructions: Apply over primary dressing as directed. Secondary Dressing: Woven Gauze Sponge, Non-Sterile 4x4 in 1 x Per Week/7 Days Discharge Instructions: Apply over primary dressing as directed. Compression Wrap: FourPress (4 layer compression wrap) 1 x Per Week/7 Days Discharge Instructions: Apply four layer compression as directed. May also use Miliken CoFlex 2 layer compression system as alternative. Electronic Signature(s) Unsigned Entered By: Deon Pilling on 05/21/2022 14:19:26 -------------------------------------------------------------------------------- Problem List Details Patient Name: Date of Service: Magaw, DA [name] 05/21/2022 1:00 PM Medical Record Number: [id number] Patient Account Number: [id number] Date of Birth/Sex: Treating RN: [birth year] (77 y.o. M) Primary Care Provider: Birdie [name] Other Clinician: Referring Provider: Treating Provider/Extender: [name] in Treatment: 8 Active Problems ICD-10 Encounter Code Description Active Date MDM Diagnosis L97.828 Non-pressure chronic ulcer of other part of left lower leg with other specified 03/26/2022 No Yes severity 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 other part of right lower leg with  other specified 03/26/2022 No Yes severity I89.0 Lymphedema, not elsewhere classified 03/26/2022 No Yes Tilmon, Czar ([id number]) [id number] Page 5 of 8 Inactive Problems Resolved Problems Electronic Signature(s) Signed: 05/21/2022 2:00:41 PM By: Worthy Keeler PA-C Entered By: Worthy Keeler on 05/21/2022 14:00:40 -------------------------------------------------------------------------------- Progress Note Details Patient Name: Date of Service: Hinchman, DA [name] 05/21/2022 1:00 PM Medical Record Number: [id number] Patient Account Number: [id number] Date of Birth/Sex: Treating RN: [birth year] (77 y.o. M) Primary Care Provider: Birdie [name] Other Clinician: Referring Provider: Treating Provider/Extender: [name] in Treatment: 8 Subjective Chief Complaint Information obtained from Patient 02/23/2020; patient is here for wounds on his bilateral lower legs in the setting of severe lymphedema 03/26/2022; patient is here for wounds on his bilateral lower legs medial aspect History of Present Illness (HPI) ADMISSION 02/23/2020 Patient is a 77 year old man who lives in [place] who arrives accompanied by his wife. He has a history of chronic lymphedema and venous insufficiency in his bilateral lower legs which may have something to do that with having a history of  DVT as well as being treated for prostate cancer. In any case he recently got compression pumps at home but compliance has been an issue here. He has compression stockings however they are probably not sufficient enough to control swelling. They tell us that things deteriorated for him in late August he was admitted to [facility] for 7 days. This was with cellulitis I think of his bilateral lower legs. Discharge he was noted to have wounds on his bilateral lower legs. He was discharged on Bactrim. They tried to get him home health through [facility] of  course they declined him. His wife is been wrapping these applying some form of silver foam dressing. He has a history of wounds before although nothing that would not heal with basic home topical dressings. He has 2 areas on the left medial, left anterior and left lateral and a smaller area on the right medial. All of these have considerable depth. Past medical history includes iron deficiency anemia, lymphedema followed by the rehab center at [facility] with lymphedema wraps I believe, DVT on chronic anticoagulation, prostate cancer, chronic venous insufficiency, hypertension. As mentioned he has compression pumps but does not use them. ABIs in our clinic were noncompressible bilaterally 10/14; patient with severe bilateral lymphedema right greater than left. He came in with bilateral lower extremity wounds left greater than right. Even though the right side has more of the edema most of the wounds here almost closed on the right medial. He has 3 remaining wounds on the left We have been using silver alginate under 4-layer compression I have been trying to get him to be compliant with his external compression pumps 10/21; patient with 3 small wounds on the left leg and 1 on the right medial in the setting of severe lymphedema and chronic venous insufficiency. We have been using silver alginate under 4-layer compression he is using his external compression pumps twice a day 11/4; ARTERIAL STUDIES on the right show an ABI of 1.02 TBI of 0.858 with biphasic waveforms on the left 0.98 with a TBI of 0.55 and biphasic waveforms. Does not look like he has significant arterial disease. We are treating him for lymphedema he has compression pumps. He has punched-out areas on the left anterior left lateral and right medial lower extremities 11/11; after we obtained his arterial studies I put him in 4 layer compression. He is using his compression pumps probably once a day although I have asked him to do  twice. Primary dressing to the wound is silver collagen he has severe lymphedema likely secondary to chronic venous insufficiency. Wounds on the left lateral, left medial and left anterior and a small area on the right medial 12/2; the area on the right anterior lower leg has healed. We initially thought that the area medially had healed as well however when her discharge nurse came in she detected fluid in the wound simply opened up. This is actually worse than I remember this pain. The area on the left lateral potentially slightly smaller He is also complaining about pain in his left hand he says that this is actually been getting some better he has been using topical creams on this. She asked that I look at this 12/9 after last [name] issues we have 2 wounds one on the right medial lower leg and 1 on the left lateral. Both of these are in the same condition. I think because of thickened skin secondary to chronic lymphedema these wounds  actually have depth of almost 0.8 cm. 12/16; the patient has 2 small but deep wounds one on the right medial and one on the left lateral. The right medial is actually the worst of these. He arrives in clinic today with absolutely terrible edema in the right leg apparently his 4-layer wrap fell down to just above his ankle he did not think about this he is apparently been continuing to use his compression pump twice a day. The left leg looks a lot better. 05/09/2020 upon evaluation today patient appears to be doing decently well in regard to his wounds. Everything is measuring smaller the right leg still has a little bit deeper wound in the left seems to be almost completely healed in my opinion I am very pleased in general with how things are progressing. He has a 4- layer compression wrap we have been using endoform today we will probably have to use collagen just based on the fact that we do not have endoform it is on order. 1/6; the patient's wound on the left  lateral lower leg has healed. Still has 1 on the right medial. 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 Using compression pumps at home twice a day. Stotler, [name] ([id number]) [id number] Page 6 of 8 1/13; left lateral lower leg is still healed. He has a deep punched out rectangular shaped wound on the right medial calf. Looking down at this it appears that he is attempting to epithelialize around the edges of the wound and on the base as well. His edema is reasonably well controlled we have been using collagen with absolutely no effect 1/20; left lateral lower leg remains closed he has extremitease stockings. The area on the right medial calf I aggressively debrided last week measures larger but the surface looks better. We have been using Hydrofera Blue. We ran Oasis through his insurance but we have not seen the results of this 1/27; left lower leg wound with chronic venous insufficiency and secondary lymphedema. I did aggressive debridement on this last week the wound seems to have come in healthy looking surface using Hydrofera Blue. He was denied for Oasis 2/3; small divot in the right medial lower leg. Under illumination the walls of this divot are epithelialized however the base has slough which I removed with a curette we have been using Hydrofera Blue 2/10 small divot on the right medial lower leg pinpoint illumination at the base of this cone-shaped wound. We have been using Hydrofera Blue but I will switch to calcium alginate this week 2/17; the small divot on the right medial lower leg is fully epithelialized. There is no visible open area under illumination. He has his own stocking for the right leg similar to the one he has been wearing on the left. 03/26/2022; READMISSION This is a now 77 year old man that we had in the clinic from 02/23/2020 through 07/05/2020. At that point he had bilateral lower extremity wounds left greater  than right in the setting of severe lymphedema. He had already obtained compression pumps ordered for him I think from the wound care clinic in [facility] so I do not really have record of what he has been using. He claims to be using them once a day but there is a problem with the sleeve on the left leg. About 2 [name] ago he was hospitalized from 03/11/2022 through 78/67/6720 with diastolic congestive heart failure. His echocardiogram showed a normal EF but with grade 1 diastolic dysfunction MR and  TR. He was diuresed. Developed some prerenal azotemia and he has not been taking any diuretics currently. He has not been putting stockings on his legs since he got out of hospital and still has his legs dependent for long periods. Past medical history history of prostate cancer treated with prostatectomy and radiation this was apparently about 8 years ago, history of DVT on chronic Coumadin, history of lymphedema was managed for a while at the clinic in [place]. History of inguinal hernia repair in September 22, hypertension, stage IIIb chronic renal failure ABIs today were noncompressible on the right 1.12 on the left 04-02-2022 upon evaluation today patient appears to be doing well currently in regard to his legs I do feel like both areas that are draining are actually much drier than they were in the picture last week although the left is drier than the right. He is tolerating the 4-layer compression wraps at this point he did contact the pump company and they are actually working on getting him a new compression sleeve for one of his legs which have previously popped and was not functioning properly. 04-23-2022 upon evaluation today patient appears to be doing well currently in regard to his wounds on the legs. I am actually very pleased with where things stand and I do feel like that we are headed in the right direction. Fortunately there is no sign of active infection locally or  systemically at this time. 05-07-2022 upon evaluation today patient appears to be doing well currently in regard to his wounds in fact things are showing signs of improvement which is good news I do not see too much that actually appears to be open and I am very pleased in that regard. No fevers, chills, nausea, vomiting, or diarrhea. 05-21-2022 upon evaluation today patient appears to be doing somewhat poorly in regard to drainage of his lower extremities bilaterally. The right is greater than left as far as the weeping area. Nonetheless it seems to be getting worse not better. He actually has pitting edema which is at least 2+ to the thighs and I am concerned about the fact that he is may be fluid overloaded in general and that is the reason why we cannot get this under control. I know he is not using his pumps all the time because he actually told the nurse that he was either going to pump or he was going to use his fluid pills but not do both. For that reason I do think that he needs to be really doing both in order to get the fluid out as effectively as possible obviously with the 4-layer compression wraps were doing as much as we can from a compression standpoint but it is really not enough. He tells me that he elevates his leg is much as he can in between pumping and other activity throughout the day. Objective Constitutional Obese and well-hydrated in no acute distress. Vitals Time Taken: 2:00 PM, Height: 74 in, Weight: 250 lbs, BMI: 32.1, Temperature: 97.7 F, Pulse: 92 bpm, Respiratory Rate: 20 breaths/min, Blood Pressure: 187/87 mmHg. Respiratory normal breathing without difficulty. Psychiatric this patient is able to make decisions and demonstrates good insight into disease process. Alert and Oriented x 3. pleasant and cooperative. General Notes: Upon inspection patient's wounds again are more areas of weeping but nonetheless seem to be larger than they were last time I saw him 2 [name]  ago. I feel like things are getting worse not better and I feel like that he  needs to make some drastic changes. I do believe that he is supposed to be seeing his cardiologist soon and I am concerned about the possibility of congestive heart failure he denies this but again his edema and in general swelling just seems to be much more significant than what I would expect just from a simple chronic venous stasis. Integumentary (Hair, Skin) Wound #6 status is Open. Original cause of wound was Gradually Appeared. The date acquired was: 03/05/2022. The wound has been in treatment 8 [name]. The wound is located on the Right,Medial Lower Leg. The wound measures 18cm length x 5.5cm width x 0.1cm depth; 77.754cm^2 area and 7.775cm^3 volume. There is Fat Layer (Subcutaneous Tissue) exposed. There is no tunneling or undermining noted. There is a large amount of purulent drainage noted. The wound margin is distinct with the outline attached to the wound base. There is no granulation within the wound bed. There is a large (67-100%) amount of necrotic tissue within the wound bed including Adherent Slough. The periwound skin appearance did not exhibit: Callus, Crepitus, Excoriation, Induration, Rash, Scarring, Dry/Scaly, Maceration, Atrophie Blanche, Cyanosis, Ecchymosis, Hemosiderin Staining, Mottled, Pallor, Rubor, Erythema. Wound #7 status is Open. Original cause of wound was Gradually Appeared. The date acquired was: 03/05/2022. The wound has been in treatment 8 [name]. Mccarrick, [name] ([id number]) [id number] Page 7 of 8 The wound is located on the Left,Medial Lower Leg. The wound measures 3cm length x 2cm width x 0.1cm depth; 4.712cm^2 area and 0.471cm^3 volume. There is Fat Layer (Subcutaneous Tissue) exposed. There is no tunneling or undermining noted. There is a medium amount of serosanguineous drainage noted. The wound margin is distinct with the outline attached to the wound base.  There is medium (34-66%) red, pink granulation within the wound bed. There is a medium (34-66%) amount of necrotic tissue within the wound bed including Adherent Slough. The periwound skin appearance did not exhibit: Callus, Crepitus, Excoriation, Induration, Rash, Scarring, Dry/Scaly, Maceration, Atrophie Blanche, Cyanosis, Ecchymosis, Hemosiderin Staining, Mottled, Pallor, Rubor, Erythema. 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 There was a Four Layer Compression Therapy Procedure by Deon Pilling, RN. Post procedure Diagnosis Wound #6: Same as Pre-Procedure Wound #7 Pre-procedure diagnosis of Wound #7 is a Lymphedema located on the Left,Medial Lower Leg . There was a Four Layer Compression Therapy Procedure by Deon Pilling, RN. Post procedure Diagnosis Wound #7: Same as Pre-Procedure Plan Follow-up Appointments: Return Appointment in 1 week. Margarita Grizzle on Wednesdays. Return Appointment in 2 [name]. Burman Blacksmith on Wednesday's Other: - ****ask Cardiology about fluid control in both legs- thighs and lower legs. Ensure to ask how much to drink fluids. Bathing/ Shower/ Hygiene: May shower with protection but do not get wound dressing(s) wet. Protect dressing(s) with water repellant cover (for example, large plastic bag) or a cast cover and may then take shower. Edema Control - Lymphedema / SCD / Other: Lymphedema Pumps. Use Lymphedema pumps on leg(s) 2-3 times a day for 45-60 minutes. If wearing any wraps or hose, do not remove them. Continue exercising as instructed. Elevate legs to the level of the heart or above for 30 minutes daily and/or when sitting for 3-4 times a day  throughout the day. Avoid standing for long  periods of time. Exercise regularly WOUND #6: - Lower Leg Wound Laterality: Right, Medial Cleanser: Soap and Water 1 x Per Week/7 Days Discharge Instructions: May shower and wash wound with dial antibacterial soap and water prior to dressing change. Peri-Wound Care: Sween Lotion (Moisturizing lotion) 1 x Per Week/7 Days Discharge Instructions: Apply moisturizing lotion as directed Prim Dressing: Hydrofera Blue Ready Transfer Foam, 8x8 (in/in) 1 x Per Week/7 Days ary Discharge Instructions: Apply to wound bed as instructed Secondary Dressing: ABD Pad, 5x9 1 x Per Week/7 Days Discharge Instructions: Apply over primary dressing as directed. Secondary Dressing: Woven Gauze Sponge, Non-Sterile 4x4 in 1 x Per Week/7 Days Discharge Instructions: Apply over primary dressing as directed. Com pression Wrap: FourPress (4 layer compression wrap) 1 x Per Week/7 Days Discharge Instructions: Apply four layer compression as directed. May also use Miliken CoFlex 2 layer compression system as alternative. WOUND #7: - Lower Leg Wound Laterality: Left, Medial Cleanser: Soap and Water 1 x Per Week/7 Days Discharge Instructions: May shower and wash wound with dial antibacterial soap and water prior to dressing change. Peri-Wound Care: Sween Lotion (Moisturizing lotion) 1 x Per Week/7 Days Discharge Instructions: Apply moisturizing lotion as directed Prim Dressing: Hydrofera Blue Ready Transfer Foam, 8x8 (in/in) 1 x Per Week/7 Days ary Discharge Instructions: Apply to wound bed as instructed Secondary Dressing: ABD Pad, 5x9 1 x Per Week/7 Days Discharge Instructions: Apply over primary dressing as directed. Secondary Dressing: Woven Gauze Sponge, Non-Sterile 4x4 in 1 x Per Week/7 Days Discharge Instructions: Apply over primary dressing as directed. Com pression Wrap: FourPress (4 layer compression wrap) 1 x Per Week/7 Days Discharge Instructions: Apply four layer  compression as directed. May also use Miliken CoFlex 2 layer compression system as alternative. 1. I am going to recommend that we have the patient continue to monitor for any signs of infection or worsening. Obviously based on what I am seeing right Hibner, [facility] ([id number]) [phone].pdf Page 8 of 8 now I think that we are headed in the right direction. 2. I am also can recommend that we have the patient continue with the dressings working switch to [facility] followed by ABD pad and roll gauze to secure in place followed by 4-layer compression wrap. 3. I am also going to suggest that the patient should continue to monitor for any signs of worsening overall obviously if anything changes he knows to contact the office and let me know but he should be taking his fluid pills as directed every day as well as using his pumps twice a day every day. If he does this in combination with everything else that we are doing and still sees no improvement including elevating his legs then I am very concerned about the possibility of a overall fluid overload or even possibly congestive heart failure exacerbation due to the pitting edema into his thighs. We will see patient back for reevaluation in 1 week here in the clinic. If anything worsens or changes patient will contact our office for additional recommendations. Electronic Signature(s) Signed: 05/21/2022 2:41:38 PM By: Worthy Keeler PA-C Entered By: Worthy Keeler on 05/21/2022 14:41:38 -------------------------------------------------------------------------------- SuperBill Details Patient Name: Date of Service: Christoffersen, DA [name] 05/21/2022 Medical Record Number: [id number] Patient Account Number: [id number] Date of Birth/Sex: Treating RN: June 19, 1945 (77 y.o. [name] Primary Care Provider: Birdie [name] Other Clinician: Referring Provider: Treating Provider/Extender: [name] in  Treatment: 8 Diagnosis Coding ICD-10 Codes Code Description [phone] Non-pressure chronic  ulcer of other part of left lower leg with other specified severity 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 other part of right lower leg with other specified severity I89.0 Lymphedema, not elsewhere classified Facility Procedures : CPT4: Code [id number] foot Description: 1 BILATERAL: Application of multi-layer venous compression system; leg (below knee), including ankle and . Modifier: Quantity: 1 Physician Procedures : CPT4 Code Description Modifier [id number] - WC PHYS LEVEL 3 - EST PT ICD-10 Diagnosis Description 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 I89.0 Lymphedema, not elsewhere classified Quantity: 1 Electronic Signature(s) Signed: 05/21/2022 2:41:50 PM By: Worthy Keeler PA-C Entered By: Worthy Keeler on 05/21/2022 14:41:50

## 2022-05-22 NOTE — Progress Notes (Signed)
Kupper, [name] ([id number]) [id number] Page 1 of 7 Visit Report for 04/23/2022 Arrival Information Details Patient Name: Date of Service: Pint, [name] 04/23/2022 11:30 A M Medical Record Number: [id number] Patient Account Number: [id number] Date of Birth/Sex: Treating RN: 05-18-1946 (77 y.o. M) Primary Care Charlee Squibb: [name] Other Clinician: Referring Jovanka Westgate: Treating Yuvia Plant/Extender: [name] in Treatment: 4 Visit Information History Since Last Visit Added or deleted any medications: No Patient Arrived: Cane Any new allergies or adverse reactions: No Arrival Time: 11:36 Had a fall or experienced change in No Accompanied By: self activities of daily living that may affect Transfer Assistance: None risk of falls: Patient Identification Verified: Yes Signs or symptoms of abuse/neglect since last visito No Secondary Verification Process Completed: Yes Hospitalized since last visit: No Patient Requires Transmission-Based Precautions: No Implantable device outside of the clinic excluding No Patient Has Alerts: Yes cellular tissue based products placed in the center Patient Alerts: Patient on Blood Thinner since last visit: Right ABI in clinic [place] Has Compression in Place as Prescribed: Yes Pain Present Now: No Electronic Signature(s) Signed: 04/23/2022 4:44:51 PM By: Erenest Blank Entered By: Erenest Blank on 04/23/2022 11:37:10 -------------------------------------------------------------------------------- Compression Therapy Details Patient Name: Date of Service: Cygan, [name] 04/23/2022 11:30 A M Medical Record Number: [id number] Patient Account Number: [id number] Date of Birth/Sex: Treating RN: December 02, 1945 (76 y.o. [name] Primary Care Eudell Julian: [name] Other Clinician: Referring Aubrianne Molyneux: Treating Ashante Snelling/Extender: [name] Weeks in Treatment:  4 Compression Therapy Performed for Wound Assessment: Wound #7 Left,Medial Lower Leg Performed By: Clinician Rhae Hammock, RN Compression Type: Four Layer Post Procedure Diagnosis Same as Pre-procedure Electronic Signature(s) Signed: 05/21/2022 5:35:40 PM By: Rhae Hammock RN Entered By: Rhae Hammock on 05/06/2022 13:28:49 Tanabe, [name] ([id number]) [id number] Page 2 of 7 -------------------------------------------------------------------------------- Compression Therapy Details Patient Name: Date of Service: Slimp, [name] 04/23/2022 11:30 A M Medical Record Number: [id number] Patient Account Number: [id number] Date of Birth/Sex: Treating RN: 11/14/1945 (77 y.o. [name], [name] Primary Care Yara Tomkinson: [name] Other Clinician: Referring Purva Vessell: Treating Danarius Mcconathy/Extender: [name] Weeks in Treatment: 4 Compression Therapy Performed for Wound Assessment: Wound #6 Right,Medial Lower Leg Performed By: Clinician Rhae Hammock, RN Compression Type: Four Layer Post Procedure Diagnosis Same as Pre-procedure Electronic Signature(s) Signed: 05/21/2022 5:35:40 PM By: Rhae Hammock RN Entered By: Rhae Hammock on 05/06/2022 13:28:49 -------------------------------------------------------------------------------- Encounter Discharge Information Details Patient Name: Date of Service: Brittle, [name] 04/23/2022 11:30 A M Medical Record Number: [id number] Patient Account Number: [id number] Date of Birth/Sex: Treating RN: 1946-05-02 (77 y.o. [name], [name] Primary Care [name]: [name] Other Clinician: Referring [name]: Treating Argie Applegate/Extender: [name] in Treatment: 4 Encounter Discharge Information Items Discharge Condition: Stable Ambulatory Status: Ambulatory Discharge Destination: Home Transportation: Private Auto Accompanied By: self Schedule  Follow-up Appointment: Yes Clinical Summary of Care: Patient Declined Electronic Signature(s) Signed: 05/21/2022 5:35:40 PM By: Rhae Hammock RN Entered By: Rhae Hammock on 05/06/2022 13:29:21 -------------------------------------------------------------------------------- Lower Extremity Assessment Details Patient Name: Date of Service: [name], [name] 04/23/2022 11:30 A M Medical Record Number: [id number] Patient Account Number: [id number] Date of Birth/Sex: Treating RN: [birth year] (77 y.o. M) Primary Care Malyia Moro: [name] Other Clinician: Referring Venissa Nappi: Treating Fortino Haag/Extender: [name], [name] Weeks in Treatment: 4 Edema Assessment Assessed: [Left: Yes] [Right: Yes] Edema: [Left: Yes] [Right: Yes] Calf Ure, Franklin ([id number]) [id number] Page 3 of 7 Left: Right: Point  of Measurement: 40 cm From Medial Instep 56.5 cm 58 cm Ankle Left: Right: Point of Measurement: 13 cm From Medial Instep 39.8 cm 39 cm Vascular Assessment Pulses: Dorsalis Pedis Palpable: [Left:Yes] [Right:Yes] Electronic Signature(s) Signed: 04/23/2022 4:44:51 PM By: Erenest Blank Entered By: Erenest Blank on 04/23/2022 11:48:30 -------------------------------------------------------------------------------- Multi-Disciplinary Care Plan Details Patient Name: Date of Service: [name], [name] 04/23/2022 11:30 A M Medical Record Number: [id number] Patient Account Number: [id number] Date of Birth/Sex: Treating RN: [birth year] (77 y.o. [name], [name] Primary Care [name]: [name] Other Clinician: Referring Leauna Sharber: Treating Dhanya Bogle/Extender: [name] Weeks in Treatment: 4 Active Inactive Wound/Skin Impairment Nursing Diagnoses: Impaired tissue integrity Knowledge deficit related to ulceration/compromised skin integrity Goals: Patient will have a decrease in wound volume by X% from date: (specify  in notes) Date Initiated: 03/26/2022 Target Resolution Date: 04/25/2022 Goal Status: Active Patient/caregiver will verbalize understanding of skin care regimen Date Initiated: 03/26/2022 Target Resolution Date: 04/25/2022 Goal Status: Active Ulcer/skin breakdown will have a volume reduction of 30% by week 4 Date Initiated: 03/26/2022 Target Resolution Date: 04/25/2022 Goal Status: Active Ulcer/skin breakdown will have a volume reduction of 50% by week 8 Date Initiated: 03/26/2022 Target Resolution Date: 04/24/2022 Goal Status: Active Interventions: Assess patient/caregiver ability to obtain necessary supplies Assess patient/caregiver ability to perform ulcer/skin care regimen upon admission and as needed Assess ulceration(s) every visit Notes: Electronic Signature(s) Signed: 05/21/2022 5:35:40 PM By: Rhae Hammock RN Entered By: Rhae Hammock on 04/23/2022 11:54:19 Monda, [name] ([id number]) [id number] Page 4 of 7 -------------------------------------------------------------------------------- Pain Assessment Details Patient Name: Date of Service: Alberty, [name] 04/23/2022 11:30 A M Medical Record Number: [id number] Patient Account Number: [id number] Date of Birth/Sex: Treating RN: 09/28/1945 (77 y.o. M) Primary Care Billee Balcerzak: [name] Other Clinician: Referring Milliana Reddoch: Treating Josiyah Tozzi/Extender: [name] Weeks in Treatment: 4 Active Problems Location of Pain Severity and Description of Pain Patient Has Paino No Site Locations Rate the pain. Current Pain Level: 0 Pain Management and Medication Current Pain Management: Medication: No Cold Application: No Rest: No Massage: No Activity: No T.E.N.S.: No Heat Application: No Leg drop or elevation: No Is the Current Pain Management Adequate: Adequate How does your wound impact your activities of daily livingo Sleep: No Bathing: No Appetite: No Relationship  With Others: No Bladder Continence: No Emotions: No Bowel Continence: No Work: No Toileting: No Drive: No Dressing: No Hobbies: No Electronic Signature(s) Signed: 04/23/2022 4:44:51 PM By: Erenest Blank Entered By: Erenest Blank on 04/23/2022 11:48:10 -------------------------------------------------------------------------------- Patient/Caregiver Education Details Patient Name: Date of Service: Weis, [name] 12/6/2023andnbsp11:30 A M Medical Record Number: [id number] Patient Account Number: [id number] Date of Birth/Gender: Treating RN: 1946-03-24 (77 y.o. [name] Primary Care Physician: [name] Other Clinician: Referring Physician: Treating Physician/Extender: [name] Weeks in Treatment: 4 Gieger, [name] ([id number]) [id number] Page 5 of 7 Education Assessment Education Provided To: Patient Education Topics Provided Wound/Skin Impairment: Methods: Explain/Verbal Responses: Reinforcements needed, State content correctly Electronic Signature(s) Signed: 05/21/2022 5:35:40 PM By: Rhae Hammock RN Entered By: Rhae Hammock on 04/23/2022 11:44:04 -------------------------------------------------------------------------------- Wound Assessment Details Patient Name: Date of Service: Thew, [name] 04/23/2022 11:30 A M Medical Record Number: [id number] Patient Account Number: [id number] Date of Birth/Sex: Treating RN: 1945/08/25 (77 y.o. M) Primary Care Deysy Schabel: [name] Other Clinician: Referring Alexius Ellington: Treating Jaycey Gens/Extender: [name], [name] Weeks in Treatment: 4 Wound Status Wound Number: 6 Primary Lymphedema Etiology: Wound Location: Right, Medial Lower  Leg Wound Open Wounding Event: Gradually Appeared Status: Date Acquired: 03/05/2022 Comorbid Anemia, Lymphedema, Deep Vein Thrombosis, Hypertension, Weeks Of Treatment: 4 History: Received  Radiation Clustered Wound: No Photos Wound Measurements Length: (cm) 8 Width: (cm) 4 Depth: (cm) 0.1 Area: (cm) 25.133 Volume: (cm) 2.513 % Reduction in Area: 82.4% % Reduction in Volume: 82.4% Epithelialization: Small (1-33%) Tunneling: No Undermining: No Wound Description Classification: Full Thickness Without Exposed Suppor Wound Margin: Distinct, outline attached Exudate Amount: Medium Exudate Type: Purulent Exudate Color: yellow, brown, green t Structures Foul Odor After Cleansing: No Slough/Fibrino Yes Wound Bed Granulation Amount: Small (1-33%) Exposed Structure Granulation Quality: Red, Pink Fascia Exposed: No Fulmore, Ason ([id number]) [id number] Page 6 of 7 Necrotic Amount: Large (67-100%) Fat Layer (Subcutaneous Tissue) Exposed: Yes Necrotic Quality: Adherent Slough Tendon Exposed: No Muscle Exposed: No Joint Exposed: No Bone Exposed: No Periwound Skin Texture Texture Color No Abnormalities Noted: No No Abnormalities Noted: No Callus: No Atrophie Blanche: No Crepitus: No Cyanosis: No Excoriation: No Ecchymosis: No Induration: No Erythema: No Rash: No Hemosiderin Staining: No Scarring: No Mottled: No Pallor: No Moisture Rubor: No No Abnormalities Noted: No Dry / Scaly: No Maceration: No Electronic Signature(s) Signed: 04/23/2022 4:44:51 PM By: Erenest Blank Entered By: Erenest Blank on 04/23/2022 11:50:09 -------------------------------------------------------------------------------- Wound Assessment Details Patient Name: Date of Service: Locey, [name] 04/23/2022 11:30 A M Medical Record Number: [id number] Patient Account Number: [id number] Date of Birth/Sex: Treating RN: April 25, 1946 (77 y.o. M) Primary Care Mallorey Odonell: [name] Other Clinician: Referring Jewett Mcgann: Treating Akira Perusse/Extender: [name], [name] Weeks in Treatment: 4 Wound Status Wound Number: 7 Primary  Lymphedema Etiology: Wound Location: Left, Medial Lower Leg Wound Open Wounding Event: Gradually Appeared Status: Date Acquired: 03/05/2022 Comorbid Anemia, Lymphedema, Deep Vein Thrombosis, Hypertension, Weeks Of Treatment: 4 History: Received Radiation Clustered Wound: No Photos Wound Measurements Length: (cm) 0 Width: (cm) 0 Depth: (cm) 0 Area: (cm) Volume: (cm) .2 % Reduction in Area: 99.9% .2 % Reduction in Volume: 99.9% .1 Epithelialization: Small (1-33%) 0.031 Tunneling: No 0.003 Undermining: No Wound Description Classification: Full Thickness Without Exposed Support Structures Wound Margin: Distinct, outline attached Tullo, Vlad ([id number]) Exudate Amount: Small Exudate Type: Serosanguineous Exudate Color: red, brown Foul Odor After Cleansing: No Slough/Fibrino Yes [id number] Page 7 of 7 Wound Bed Granulation Amount: Medium (34-66%) Exposed Structure Granulation Quality: Red, Pink Fascia Exposed: No Necrotic Amount: Medium (34-66%) Fat Layer (Subcutaneous Tissue) Exposed: Yes Necrotic Quality: Adherent Slough Tendon Exposed: No Muscle Exposed: No Joint Exposed: No Bone Exposed: No Periwound Skin Texture Texture Color No Abnormalities Noted: No No Abnormalities Noted: No Callus: No Atrophie Blanche: No Crepitus: No Cyanosis: No Excoriation: No Ecchymosis: No Induration: No Erythema: No Rash: No Hemosiderin Staining: No Scarring: No Mottled: No Pallor: No Moisture Rubor: No No Abnormalities Noted: No Dry / Scaly: No Maceration: No Electronic Signature(s) Signed: 04/23/2022 4:44:51 PM By: Erenest Blank Entered By: Erenest Blank on 04/23/2022 11:50:38 -------------------------------------------------------------------------------- Vitals Details Patient Name: Date of Service: Buchan, [name] 04/23/2022 11:30 A M Medical Record Number: [id number] Patient Account Number: [id number] Date of Birth/Sex: Treating  RN: [birth year] (77 y.o. M) Primary Care Lyncoln Maskell: [name] Other Clinician: Referring Chalmer Zheng: Treating Deari Sessler/Extender: [name], [name] Weeks in Treatment: 4 Vital Signs Time Taken: 11:41 Pulse (bpm): 96 Height (in): 74 Respiratory Rate (breaths/min): 18 Weight (lbs): 250 Blood Pressure (mmHg): 183/70 Body Mass Index (BMI): 32.1 Reference Range: 80 - 120 mg / dl Electronic Signature(s) Signed: 04/23/2022 4:44:51 PM By: Erenest Blank  Entered By: Erenest Blank on 04/23/2022 11:41:23

## 2022-05-22 NOTE — Progress Notes (Signed)
Bounds, [name] ([id number]) [id number] Page 1 of 8 Visit Report for 05/21/2022 Arrival Information Details Patient Name: Date of Service: [name], [name] 05/21/2022 1:00 PM Medical Record Number: [id number] Patient Account Number: [id number] Date of Birth/Sex: Treating RN: 11/22/1945 (77 y.o. [name], [name] Primary Care Talayia Hjort: [name] Other Clinician: Referring Lorean Ekstrand: Treating Kimmarie Pascale/Extender: [name] in Treatment: 8 Visit Information History Since Last Visit Added or deleted any medications: No Patient Arrived: [name] Any new allergies or adverse reactions: No Arrival Time: 14:50 Had a fall or experienced change in No Accompanied By: self activities of daily living that may affect Transfer Assistance: None risk of falls: Patient Identification Verified: Yes Signs or symptoms of abuse/neglect since last visito No Secondary Verification Process Completed: Yes Hospitalized since last visit: No Patient Requires Transmission-Based Precautions: No Implantable device outside of the clinic excluding No Patient Has Alerts: Yes cellular tissue based products placed in the center Patient Alerts: Patient on Blood Thinner since last visit: Right ABI in clinic [place] Has Dressing in Place as Prescribed: Yes Has Compression in Place as Prescribed: Yes Pain Present Now: No Notes patient 42 minutes late to appt. Explained to be on time for appointments as it is important not to be rescheduled. patient in agreement. Electronic Signature([name]) Signed: 05/21/2022 5:47:40 PM By: [name] Pilling RN, [name] Entered By: [name] on 05/21/2022 14:01:42 -------------------------------------------------------------------------------- Compression Therapy Details Patient Name: Date of Service: [name] 05/21/2022 1:00 PM Medical Record Number: [id number] Patient Account Number: [id number] Date of Birth/Sex: Treating RN: [birth year] (77  y.o. [name] Primary Care Chaniece Barbato: [name] Other Clinician: Referring Nathanyal Ashmead: Treating Almena Hokenson/Extender: [name] Weeks in Treatment: 8 Compression Therapy Performed for Wound Assessment: Wound #6 Right,Medial Lower Leg Performed By: Clinician [name] Pilling, RN Compression Type: Four Layer Post Procedure Diagnosis Same as Pre-procedure Electronic Signature([name]) Signed: 05/21/2022 5:47:40 PM By: [name] Pilling RN, [name] Entered By: [name] on 05/21/2022 14:19:34 Lenart, [name] ([id number]) [id number] Page 2 of 8 -------------------------------------------------------------------------------- Compression Therapy Details Patient Name: Date of Service: [name] 05/21/2022 1:00 PM Medical Record Number: [id number] Patient Account Number: [id number] Date of Birth/Sex: Treating RN: 03/18/1946 (77 y.o. [name] Primary Care Ghada Abbett: [name] Other Clinician: Referring Aalani Aikens: Treating Chaitra Mast/Extender: [name] Weeks in Treatment: 8 Compression Therapy Performed for Wound Assessment: Wound #7 Left,Medial Lower Leg Performed By: Clinician [name] Pilling, RN Compression Type: Four Layer Post Procedure Diagnosis Same as Pre-procedure Electronic Signature([name]) Signed: 05/21/2022 5:47:40 PM By: [name] Pilling RN, [name] Entered By: [name] on 05/21/2022 14:19:41 -------------------------------------------------------------------------------- Encounter Discharge Information Details Patient Name: Date of Service: [name] 05/21/2022 1:00 PM Medical Record Number: [id number] Patient Account Number: [id number] Date of Birth/Sex: Treating RN: [birth year] (77 y.o. [name] Primary Care Gao Mitnick: [name] Other Clinician: Referring Marsia Cino: Treating [name]/Extender: [name] in Treatment: 8 Encounter Discharge Information  Items Discharge Condition: Stable Ambulatory Status: Cane Discharge Destination: Home Transportation: Private Auto Accompanied By: self Schedule Follow-up Appointment: Yes Clinical Summary of Care: Electronic Signature([name]) Signed: 05/21/2022 5:47:40 PM By: [name] Pilling RN, [name] Entered By: [name] on 05/21/2022 14:20:15 -------------------------------------------------------------------------------- Lower Extremity Assessment Details Patient Name: Date of Service: [name] 05/21/2022 1:00 PM Medical Record Number: [id number] Patient Account Number: [id number] Date of Birth/Sex: Treating RN: 11-10-1945 (77 y.o. [name] Primary Care Keshanna Riso: [name] Other Clinician: Referring Sabrine Patchen: Treating [name]/Extender:  [name] Weeks in Treatment: 8 Edema Assessment Left: [Left: Right] [Right: :] Assessed: [Left: Yes] [Right: Yes] Edema: [Left: Yes] [Right: Yes] Calf Left: Right: Point of Measurement: 40 cm From Medial Instep 59 cm 63 cm Ankle Left: Right: Point of Measurement: 13 cm From Medial Instep 38 cm 42 cm Electronic Signature([name]) Signed: 05/21/2022 5:47:40 PM By: [name] Pilling RN, [name] Entered By: [name] on 05/21/2022 14:03:01 -------------------------------------------------------------------------------- [place] Details Patient Name: Date of Service: [name] 05/21/2022 1:00 PM Medical Record Number: [id number] Patient Account Number: [id number] Date of Birth/Sex: Treating RN: 1946/01/27 (77 y.o. [name] Primary Care Morgon [name]: [name] Other Clinician: Referring Raianna Slight: Treating [name]/Extender: [name] Weeks in Treatment: 8 Active Inactive Wound/Skin Impairment Nursing Diagnoses: Impaired tissue integrity Knowledge deficit related to ulceration/compromised skin integrity Goals: Patient will have a decrease in wound volume by X% from date:  (specify in notes) Date Initiated: 03/26/2022 Target Resolution Date: 06/20/2022 Goal Status: Active Patient/caregiver will verbalize understanding of skin care regimen Date Initiated: 03/26/2022 Target Resolution Date: 06/20/2022 Goal Status: Active Ulcer/skin breakdown will have a volume reduction of 30% by week 4 Date Initiated: 03/26/2022 Date Inactivated: 05/21/2022 Target Resolution Date: 05/17/2022 Unmet Reason: see wound Goal Status: Unmet measurement. Ulcer/skin breakdown will have a volume reduction of 50% by week 8 Date Initiated: 03/26/2022 Date Inactivated: 05/21/2022 Target Resolution Date: 05/17/2022 Unmet Reason: see wound Goal Status: Unmet measurement. Interventions: Assess patient/caregiver ability to obtain necessary supplies Assess patient/caregiver ability to perform ulcer/skin care regimen upon admission and as needed Assess ulceration([name]) every visit Notes: Patient stated today, "I will take my fluid pill or pump not do both." Kerri Asche made aware. Electronic Signature([name]) Signed: 05/21/2022 5:47:40 PM By: [name] Pilling RN, [name] Entered By: [name] on 05/21/2022 14:09:50 Grandpre, [name] ([id number]) [id number] Page 4 of 8 -------------------------------------------------------------------------------- Pain Assessment Details Patient Name: Date of Service: Stallworth, DA [name] 05/21/2022 1:00 PM Medical Record Number: [id number] Patient Account Number: [id number] Date of Birth/Sex: Treating RN: [birth year] (77 y.o. [name] Primary Care Samaiyah Howes: [name] Other Clinician: Referring Haeley Fordham: Treating Awais Cobarrubias/Extender: [name] Weeks in Treatment: 8 Active Problems Location of Pain Severity and Description of Pain Patient Has Paino No Site Locations Rate the pain. Current Pain Level: 0 Pain Management and Medication Current Pain Management: Medication: No Cold Application: No Rest: No Massage:  No Activity: No T.E.N.[name].: No Heat Application: No Leg drop or elevation: No Is the Current Pain Management Adequate: Adequate How does your wound impact your activities of daily livingo Sleep: No Bathing: No Appetite: No Relationship With Others: No Bladder Continence: No Emotions: No Bowel Continence: No Work: No Toileting: No Drive: No Dressing: No Hobbies: No Engineer, maintenance) Signed: 05/21/2022 5:47:40 PM By: [name] Pilling RN, [name] Entered By: [name] on 05/21/2022 14:02:04 -------------------------------------------------------------------------------- Patient/Caregiver Education Details Patient Name: Date of Service: Kamps, DA [name] 1/3/2024andnbsp1:00 PM Medical Record Number: [id number] Patient Account Number: [id number] Date of Birth/Gender: Treating RN: 01-06-1946 (77 y.o. [name] Primary Care Physician: [name] Other Clinician: Referring Physician: Treating Physician/Extender: [name] Weeks in Treatment: 8 Linquist, [name] ([id number]) [id number] Page 5 of 8 Education Assessment Education Provided To: Patient Education Topics Provided Wound/Skin Impairment: Handouts: Caring for Your Ulcer Methods: Explain/Verbal Responses: Reinforcements needed Electronic Signature([name]) Signed: 05/21/2022 5:47:40 PM By: [name] Pilling RN, [name] Entered By: [name] on 05/21/2022 14:10:15 -------------------------------------------------------------------------------- Wound Assessment Details Patient  Name: Date of Service: Mcgreal, [name] 05/21/2022 1:00 PM Medical Record Number: [id number] Patient Account Number: [id number] Date of Birth/Sex: Treating RN: 04/15/1946 (77 y.o. [name], [name] Primary Care Lilo Wallington: [name] Other Clinician: Referring Tevion Laforge: Treating Bernardo Brayman/Extender: [name] Weeks in Treatment: 8 Wound Status Wound Number: 6 Primary  Lymphedema Etiology: Wound Location: Right, Medial Lower Leg Wound Open Wounding Event: Gradually Appeared Status: Date Acquired: 03/05/2022 Comorbid Anemia, Lymphedema, Deep Vein Thrombosis, Hypertension, Weeks Of Treatment: 8 History: Received Radiation Clustered Wound: No Photos Wound Measurements Length: (cm) 18 Width: (cm) 5.5 Depth: (cm) 0.1 Area: (cm) 77.754 Volume: (cm) 7.775 % Reduction in Area: 45.6% % Reduction in Volume: 45.6% Epithelialization: None Tunneling: No Undermining: No Wound Description Classification: Full Thickness Without Exposed Support Wound Margin: Distinct, outline attached Exudate Amount: Large Exudate Type: Purulent Exudate Color: yellow, brown, green Structures Foul Odor After Cleansing: No Slough/Fibrino Yes Wound Bed Granulation Amount: None Present (0%) Exposed Structure Aguilar, Rosalie ([id number]) [id number] Page 6 of 8 Necrotic Amount: Large (67-100%) Fascia Exposed: No Necrotic Quality: Adherent Slough Fat Layer (Subcutaneous Tissue) Exposed: Yes Tendon Exposed: No Muscle Exposed: No Joint Exposed: No Bone Exposed: No Periwound Skin Texture Texture Color No Abnormalities Noted: No No Abnormalities Noted: No Callus: No Atrophie Blanche: No Crepitus: No Cyanosis: No Excoriation: No Ecchymosis: No Induration: No Erythema: No Rash: No Hemosiderin Staining: No Scarring: No Mottled: No Pallor: No Moisture Rubor: No No Abnormalities Noted: No Dry / Scaly: No Maceration: No Treatment Notes Wound #6 (Lower Leg) Wound Laterality: Right, Medial Cleanser Soap and Water Discharge Instruction: May shower and wash wound with dial antibacterial soap and water prior to dressing change. Peri-Wound Care Sween Lotion (Moisturizing lotion) Discharge Instruction: Apply moisturizing lotion as directed Topical Primary Dressing Hydrofera Blue Ready Transfer Foam, 8x8 (in/in) Discharge Instruction:  Apply to wound bed as instructed Secondary Dressing ABD Pad, 5x9 Discharge Instruction: Apply over primary dressing as directed. Woven Gauze Sponge, Non-Sterile 4x4 in Discharge Instruction: Apply over primary dressing as directed. Secured With Compression Wrap FourPress (4 layer compression wrap) Discharge Instruction: Apply four layer compression as directed. May also use Miliken CoFlex 2 layer compression system as alternative. Compression Stockings Add-Ons Electronic Signature([name]) Signed: 05/21/2022 4:38:21 PM By: Erenest Blank Signed: 05/21/2022 5:47:40 PM By: [name] Pilling RN, [name] Entered By: Erenest Blank on 05/21/2022 14:05:41 -------------------------------------------------------------------------------- Wound Assessment Details Patient Name: Date of Service: Pogue, DA [name] 05/21/2022 1:00 PM Medical Record Number: [id number] Patient Account Number: [id number] Date of Birth/Sex: Treating RN: 03/08/1946 (77 y.o. [name] Primary Care Risha Barretta: [name] Other Clinician: Referring Promyse Ardito: Treating Delisa Finck/Extender: [name] Ricciardelli, [name] ([id number]) [id number] Page 7 of 8 Weeks in Treatment: 8 Wound Status Wound Number: 7 Primary Lymphedema Etiology: Wound Location: Left, Medial Lower Leg Wound Open Wounding Event: Gradually Appeared Status: Date Acquired: 03/05/2022 Comorbid Anemia, Lymphedema, Deep Vein Thrombosis, Hypertension, Weeks Of Treatment: 8 History: Received Radiation Clustered Wound: No Photos Wound Measurements Length: (cm) 3 Width: (cm) 2 Depth: (cm) 0.1 Area: (cm) 4.712 Volume: (cm) 0.471 % Reduction in Area: 90% % Reduction in Volume: 90% Epithelialization: Small (1-33%) Tunneling: No Undermining: No Wound Description Classification: Full Thickness Without Exposed Suppor Wound Margin: Distinct, outline attached Exudate Amount: Medium Exudate Type:  Serosanguineous Exudate Color: red, brown t Structures Foul Odor After Cleansing: No Slough/Fibrino Yes Wound Bed Granulation Amount: Medium (34-66%) Exposed Structure Granulation Quality: Red, Pink Fascia Exposed: No Necrotic Amount: Medium (34-66%) Fat Layer (Subcutaneous  Tissue) Exposed: Yes Necrotic Quality: Adherent Slough Tendon Exposed: No Muscle Exposed: No Joint Exposed: No Bone Exposed: No Periwound Skin Texture Texture Color No Abnormalities Noted: No No Abnormalities Noted: No Callus: No Atrophie Blanche: No Crepitus: No Cyanosis: No Excoriation: No Ecchymosis: No Induration: No Erythema: No Rash: No Hemosiderin Staining: No Scarring: No Mottled: No Pallor: No Moisture Rubor: No No Abnormalities Noted: No Dry / Scaly: No Maceration: No Treatment Notes Wound #7 (Lower Leg) Wound Laterality: Left, Medial Cleanser Soap and Water Discharge Instruction: May shower and wash wound with dial antibacterial soap and water prior to dressing change. Peri-Wound Care Sween Lotion (Moisturizing lotion) Discharge Instruction: Apply moisturizing lotion as directed Goytia, Josephanthony ([id number]) [id number] Page 8 of 8 Topical Primary Dressing Hydrofera Blue Ready Transfer Foam, 8x8 (in/in) Discharge Instruction: Apply to wound bed as instructed Secondary Dressing ABD Pad, 5x9 Discharge Instruction: Apply over primary dressing as directed. Woven Gauze Sponge, Non-Sterile 4x4 in Discharge Instruction: Apply over primary dressing as directed. Secured With Compression Wrap FourPress (4 layer compression wrap) Discharge Instruction: Apply four layer compression as directed. May also use Miliken CoFlex 2 layer compression system as alternative. Compression Stockings Add-Ons Electronic Signature([name]) Signed: 05/21/2022 4:38:21 PM By: Erenest Blank Signed: 05/21/2022 5:47:40 PM By: [name] Pilling RN, [name] Entered By: Erenest Blank on 05/21/2022  14:06:41 -------------------------------------------------------------------------------- Vitals Details Patient Name: Date of Service: Jacuinde, DA [name] 05/21/2022 1:00 PM Medical Record Number: [id number] Patient Account Number: [id number] Date of Birth/Sex: Treating RN: 05-Mar-1946 (77 y.o. [name] Primary Care Slyvia Lartigue: [name] Other Clinician: Referring Tyquan Carmickle: Treating Mahdi Frye/Extender: [name] Weeks in Treatment: 8 Vital Signs Time Taken: 14:00 Temperature (F): 97.7 Height (in): 74 Pulse (bpm): 92 Weight (lbs): 250 Respiratory Rate (breaths/min): 20 Body Mass Index (BMI): 32.1 Blood Pressure (mmHg): 187/87 Reference Range: 80 - 120 mg / dl Electronic Signature([name]) Signed: 05/21/2022 5:47:40 PM By: [name] Pilling RN, [name] Entered By: [name] on 05/21/2022 14:01:57

## 2022-05-28 ENCOUNTER — Encounter (HOSPITAL_BASED_OUTPATIENT_CLINIC_OR_DEPARTMENT_OTHER): Payer: Medicare Other | Admitting: Physician Assistant

## 2022-05-28 DIAGNOSIS — I87333 Chronic venous hypertension (idiopathic) with ulcer and inflammation of bilateral lower extremity: Secondary | ICD-10-CM | POA: Diagnosis not present

## 2022-06-04 ENCOUNTER — Encounter (HOSPITAL_BASED_OUTPATIENT_CLINIC_OR_DEPARTMENT_OTHER): Payer: Medicare Other | Admitting: Physician Assistant

## 2022-06-04 DIAGNOSIS — I87333 Chronic venous hypertension (idiopathic) with ulcer and inflammation of bilateral lower extremity: Secondary | ICD-10-CM | POA: Diagnosis not present

## 2022-06-04 NOTE — Progress Notes (Addendum)
Bischof, [name] ([id number]) [id number].pdf Page 1 of 8 Visit Report for 06/04/2022 Chief Complaint Document Details Patient Name: Date of Service: [name], [name] 06/04/2022 1:30 PM Medical Record Number: [id number] Patient Account Number: [id number] Date of Birth/Sex: Treating RN: 1945-07-20 (77 y.o. M) Primary Care Provider: [name] Other Clinician: Referring Provider: Treating Provider/Extender: [name] Weeks in Treatment: 10 Information Obtained from: Patient Chief Complaint 02/23/2020; patient is here for wounds on his bilateral lower legs in the setting of severe lymphedema 03/26/2022; patient is here for wounds on his bilateral lower legs medial aspect Electronic Signature(s) Signed: 06/04/2022 2:20:06 PM By: Worthy Keeler PA-C Entered By: Worthy Keeler on 06/04/2022 14:20:06 -------------------------------------------------------------------------------- HPI Details Patient Name: Date of Service: Shimizu, [name] 06/04/2022 1:30 PM Medical Record Number: [id number] Patient Account Number: [id number] Date of Birth/Sex: Treating RN: May 14, 1946 (77 y.o. M) Primary Care Provider: [name] Other Clinician: Referring Provider: Treating Provider/Extender: [name] Weeks in Treatment: 10 History of Present Illness HPI Description: ADMISSION 02/23/2020 Patient is a 77 year old man who lives in [place] who arrives accompanied by his wife. He has a history of chronic lymphedema and venous insufficiency in his bilateral lower legs which may have something to do that with having a history of DVT as well as being treated for prostate cancer. In any case he recently got compression pumps at home but compliance has been an issue here. He has compression stockings however they are probably not sufficient enough to control swelling. They tell us that things deteriorated for him in late August he was  admitted to [facility] for 7 days. This was with cellulitis I think of his bilateral lower legs. Discharge he was noted to have wounds on his bilateral lower legs. He was discharged on Bactrim. They tried to get him home health through [facility] of course they declined him. His wife is been wrapping these applying some form of silver foam dressing. He has a history of wounds before although nothing that would not heal with basic home topical dressings. He has 2 areas on the left medial, left anterior and left lateral and a smaller area on the right medial. All of these have considerable depth. Past medical history includes iron deficiency anemia, lymphedema followed by the rehab center at [facility] with lymphedema wraps I believe, DVT on chronic anticoagulation, prostate cancer, chronic venous insufficiency, hypertension. As mentioned he has compression pumps but does not use them. ABIs in our clinic were noncompressible bilaterally 10/14; patient with severe bilateral lymphedema right greater than left. He came in with bilateral lower extremity wounds left greater than right. Even though the right side has more of the edema most of the wounds here almost closed on the right medial. He has 3 remaining wounds on the left We have been using silver alginate under 4-layer compression I have been trying to get him to be compliant with his external compression pumps 10/21; patient with 3 small wounds on the left leg and 1 on the right medial in the setting of severe lymphedema and chronic venous insufficiency. We have been using silver alginate under 4-layer compression he is using his external compression pumps twice a day 11/4; ARTERIAL STUDIES on the right show an ABI of 1.02 TBI of 0.858 with biphasic waveforms on the left 0.98 with a TBI of 0.55 and biphasic waveforms. Does not look like he has significant arterial disease. We are treating  him for lymphedema he has compression  pumps. He has punched-out areas on the left anterior left lateral and right medial lower extremities 11/11; after we obtained his arterial studies I put him in 4 layer compression. He is using his compression pumps probably once a day although I have asked Geiler, [name] ([id number]) [phone].pdf Page 2 of 8 him to do twice. Primary dressing to the wound is silver collagen he has severe lymphedema likely secondary to chronic venous insufficiency. Wounds on the left lateral, left medial and left anterior and a small area on the right medial 12/2; the area on the right anterior lower leg has healed. We initially thought that the area medially had healed as well however when her discharge nurse came in she detected fluid in the wound simply opened up. This is actually worse than I remember this pain. The area on the left lateral potentially slightly smaller He is also complaining about pain in his left hand he says that this is actually been getting some better he has been using topical creams on this. She asked that I look at this 12/9 after last weeks issues we have 2 wounds one on the right medial lower leg and 1 on the left lateral. Both of these are in the same condition. I think because of thickened skin secondary to chronic lymphedema these wounds actually have depth of almost 0.8 cm. 12/16; the patient has 2 small but deep wounds one on the right medial and one on the left lateral. The right medial is actually the worst of these. He arrives in clinic today with absolutely terrible edema in the right leg apparently his 4-layer wrap fell down to just above his ankle he did not think about this he is apparently been continuing to use his compression pump twice a day. The left leg looks a lot better. 05/09/2020 upon evaluation today patient appears to be doing decently well in regard to his wounds. Everything is measuring smaller the right leg still has a little bit deeper  wound in the left seems to be almost completely healed in my opinion I am very pleased in general with how things are progressing. He has a 4- layer compression wrap we have been using endoform today we will probably have to use collagen just based on the fact that we do not have endoform it is on order. 1/6; the patient's wound on the left lateral lower leg has healed. Still has 1 on the right medial. 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 Using compression pumps at home twice a day. 1/13; left lateral lower leg is still healed. He has a deep punched out rectangular shaped wound on the right medial calf. Looking down at this it appears that he is attempting to epithelialize around the edges of the wound and on the base as well. His edema is reasonably well controlled we have been using collagen with absolutely no effect 1/20; left lateral lower leg remains closed he has extremitease stockings. The area on the right medial calf I aggressively debrided last week measures larger but the surface looks better. We have been using Hydrofera Blue. We ran Oasis through his insurance but we have not seen the results of this 1/27; left lower leg wound with chronic venous insufficiency and secondary lymphedema. I did aggressive debridement on this last week the wound seems to have come in healthy looking surface using Hydrofera Blue. He was denied for Oasis 2/3; small divot in the right medial  lower leg. Under illumination the walls of this divot are epithelialized however the base has slough which I removed with a curette we have been using Hydrofera Blue 2/10 small divot on the right medial lower leg pinpoint illumination at the base of this cone-shaped wound. We have been using Hydrofera Blue but I will switch to calcium alginate this week 2/17; the small divot on the right medial lower leg is fully epithelialized. There is no visible open area under illumination. He has his own  stocking for the right leg similar to the one he has been wearing on the left. 03/26/2022; READMISSION This is a now 77 year old man that we had in the clinic from 02/23/2020 through 07/05/2020. At that point he had bilateral lower extremity wounds left greater than right in the setting of severe lymphedema. He had already obtained compression pumps ordered for him I think from the wound care clinic in [facility] so I do not really have record of what he has been using. He claims to be using them once a day but there is a problem with the sleeve on the left leg. About 2 weeks ago he was hospitalized from 03/11/2022 through [id number] with diastolic congestive heart failure. 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 He was diuresed. Developed some prerenal azotemia and he has not been taking any diuretics currently. He has not been putting stockings on his legs since he got out of hospital and still has his legs dependent for long periods. Past medical history history of prostate cancer treated with prostatectomy and radiation this was apparently about 8 years ago, history of DVT on chronic Coumadin, history of lymphedema was managed for a while at the clinic in [place]. History of inguinal hernia repair in September 22, hypertension, stage IIIb chronic renal failure ABIs today were noncompressible on the right 1.12 on the left 04-02-2022 upon evaluation today patient appears to be doing well currently in regard to his legs I do feel like both areas that are draining are actually much drier than they were in the picture last week although the left is drier than the right. He is tolerating the 4-layer compression wraps at this point he did contact the pump company and they are actually working on getting him a new compression sleeve for one of his legs which have previously popped and was not functioning properly. 04-23-2022 upon evaluation today  patient appears to be doing well currently in regard to his wounds on the legs. I am actually very pleased with where things stand and I do feel like that we are headed in the right direction. Fortunately there is no sign of active infection locally or systemically at this time. 05-07-2022 upon evaluation today patient appears to be doing well currently in regard to his wounds in fact things are showing signs of improvement which is good news I do not see too much that actually appears to be open and I am very pleased in that regard. No fevers, chills, nausea, vomiting, or diarrhea. 05-21-2022 upon evaluation today patient appears to be doing somewhat poorly in regard to drainage of his lower extremities bilaterally. The right is greater than left as far as the weeping area. Nonetheless it seems to be getting worse not better. He actually has pitting edema which is at least 2+ to the thighs and I am concerned about the fact that he is may be fluid overloaded in general and that is the  reason why we cannot get this under control. I know he is not using his pumps all the time because he actually told the nurse that he was either going to pump or he was going to use his fluid pills but not do both. For that reason I do think that he needs to be really doing both in order to get the fluid out as effectively as possible obviously with the 4-layer compression wraps were doing as much as we can from a compression standpoint but it is really not enough. He tells me that he elevates his leg is much as he can in between pumping and other activity throughout the day. 05-28-2022 upon evaluation today patient appears to be doing better in regard to his wounds although the measurements may be a little bit larger this is a very difficult wound to heal it is very indistinct in a lot of areas. Nonetheless there is can be some need for sharp debridement in regard to both medial and lateral legs. Fortunately I see no signs  of active infection locally nor systemically at this time. No fevers, chills, nausea, vomiting, or diarrhea. 06-04-2022 upon evaluation today patient appears to be doing poorly in general in regard to the wounds on his legs. He still continues to have a tremendous amount of fluid not just in the lower portion of his leg but to be honest his thigh where he has 2-3+ pitting edema in the thigh as well. Unfortunately I do not know that we will be able to get this healed effectively and keep it healed on the lower extremities unless he gets the overall fluid situation taking and under control. Fortunately I do not see any signs of infection locally nor systemically which is great news. He just seems to be very fluid overloaded. Electronic Signature(s) Signed: 06/04/2022 3:50:01 PM By: Worthy Keeler PA-C Entered By: Worthy Keeler on 06/04/2022 15:50:01 Pasqual, [name] ([id number] Page 3 of 8 -------------------------------------------------------------------------------- Physical Exam Details Patient Name: Date of Service: Busic, [name] 06/04/2022 1:30 PM Medical Record Number: [id number] Patient Account Number: [id number] Date of Birth/Sex: Treating RN: Apr 10, 1946 (77 y.o. M) Primary Care Provider: [name] Other Clinician: Referring Provider: Treating Provider/Extender: [name] Weeks in Treatment: 10 Constitutional Obese and well-hydrated in no acute distress. Respiratory normal breathing without difficulty. Psychiatric this patient is able to make decisions and demonstrates good insight into disease process. Alert and Oriented x 3. pleasant and cooperative. Notes Patient's legs appear to be weeping there does not appear to be any signs of infection in general I think the biggest issue here is just his overall fluid overload status. Electronic Signature(s) Signed: 06/04/2022 3:52:02 PM By: Worthy Keeler  PA-C Entered By: Worthy Keeler on 06/04/2022 15:52:02 -------------------------------------------------------------------------------- Physician Orders Details Patient Name: Date of Service: Lierman, [name] 06/04/2022 1:30 PM Medical Record Number: [id number] Patient Account Number: [id number] Date of Birth/Sex: Treating RN: 08/30/1945 (77 y.o. [name], [name] Primary Care Provider: [name] Other Clinician: Referring Provider: Treating Provider/Extender: [name] Weeks in Treatment: 10 Verbal / Phone Orders: No Diagnosis Coding ICD-10 Coding Code Description [phone] Non-pressure chronic ulcer of other part of left lower leg with other specified severity 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 other part of right lower leg with other specified severity I89.0 Lymphedema, not elsewhere classified Follow-up Appointments ppointment in 1 week. Margarita Grizzle on Wednesdays 06/11/22 @ 12:45 Rm # 8 w/ [name] (already  has appt.) Return A ppointment in 2 weeks. - w/ Sheridan Gettel ST on WEdnesday's (go ahead and schedule appt.) one Return A Other: - ****ask Cardiology about fluid control in both legs- thighs and lower legs. Ensure to ask how much to drink fluids. Bathing/ Shower/ Hygiene May shower with protection but do not get wound dressing(s) wet. Protect dressing(s) with water repellant cover (for example, large plastic bag) or a cast cover and may then take shower. Edema Control - Lymphedema / SCD / Other Lymphedema Pumps. Use Lymphedema pumps on leg(s) 2-3 times a day for 45-60 minutes. If wearing any wraps or hose, do not remove them. Continue exercising as instructed. Elevate legs to the level of the heart or above for 30 minutes daily and/or when sitting for 3-4 times a day throughout the day. Avoid standing for long periods of time. Exercise regularly Wound Treatment Stokke, [name] ([id number].pdf Page 4 of 8 Wound #6 - Lower  Leg Wound Laterality: Right, Medial Cleanser: Soap and Water 1 x Per Week/7 Days Discharge Instructions: May shower and wash wound with dial antibacterial soap and water prior to dressing change. Peri-Wound Care: Sween Lotion (Moisturizing lotion) 1 x Per Week/7 Days Discharge Instructions: Apply moisturizing lotion as directed Prim Dressing: Sorbalgon AG Dressing, 4x4 (in/in) 1 x Per Week/7 Days ary Discharge Instructions: Apply to wound bed as instructed Secondary Dressing: ABD Pad, 5x9 1 x Per Week/7 Days Discharge Instructions: Apply over primary dressing as directed. Secondary Dressing: Woven Gauze Sponge, Non-Sterile 4x4 in 1 x Per Week/7 Days Discharge Instructions: Apply over primary dressing as directed. Compression Wrap: FourPress (4 layer compression wrap) 1 x Per Week/7 Days Discharge Instructions: Apply four layer compression as directed. May also use Miliken CoFlex 2 layer compression system as alternative. Wound #7 - Lower Leg Wound Laterality: Left, Medial Cleanser: Soap and Water 1 x Per Week/7 Days Discharge Instructions: May shower and wash wound with dial antibacterial soap and water prior to dressing change. Peri-Wound Care: Sween Lotion (Moisturizing lotion) 1 x Per Week/7 Days Discharge Instructions: Apply moisturizing lotion as directed Prim Dressing: Sorbalgon AG Dressing, 4x4 (in/in) 1 x Per Week/7 Days ary Discharge Instructions: Apply to wound bed as instructed Secondary Dressing: ABD Pad, 5x9 1 x Per Week/7 Days Discharge Instructions: Apply over primary dressing as directed. Secondary Dressing: Woven Gauze Sponge, Non-Sterile 4x4 in 1 x Per Week/7 Days Discharge Instructions: Apply over primary dressing as directed. Compression Wrap: FourPress (4 layer compression wrap) 1 x Per Week/7 Days Discharge Instructions: Apply four layer compression as directed. May also use Miliken CoFlex 2 layer compression system as alternative. Electronic Signature(s) Signed:  06/04/2022 3:53:33 PM By: Worthy Keeler PA-C Signed: 07/07/2022 10:40:58 AM By: Rhae Hammock RN Entered By: Rhae Hammock on 06/04/2022 14:39:51 -------------------------------------------------------------------------------- Problem List Details Patient Name: Date of Service: Tumminello, [name] 06/04/2022 1:30 PM Medical Record Number: [id number] Patient Account Number: [id number] Date of Birth/Sex: Treating RN: 1945-12-17 (77 y.o. M) Primary Care Provider: [name] Other Clinician: Referring Provider: Treating Provider/Extender: [name] Weeks in Treatment: 10 Active Problems ICD-10 Encounter Code Description Active Date MDM Diagnosis L97.828 Non-pressure chronic ulcer of other part of left lower leg with other specified 03/26/2022 No Yes severity 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 other part of right lower leg with other specified 03/26/2022 No Yes severity Gonce, Torben ([id number]) [phone].pdf Page 5 of 8 I89.0 Lymphedema, not elsewhere classified 03/26/2022 No Yes Inactive Problems Resolved Problems Electronic Signature(s) Signed: 06/04/2022 2:19:58 PM  By: Worthy Keeler PA-C Entered By: Worthy Keeler on 06/04/2022 14:19:57 -------------------------------------------------------------------------------- Progress Note Details Patient Name: Date of Service: Finklea, [name] 06/04/2022 1:30 PM Medical Record Number: [id number] Patient Account Number: [id number] Date of Birth/Sex: Treating RN: May 22, 1945 (77 y.o. M) Primary Care Provider: [name] Other Clinician: Referring Provider: Treating Provider/Extender: [name] Weeks in Treatment: 10 Subjective Chief Complaint Information obtained from Patient 02/23/2020; patient is here for wounds on his bilateral lower legs in the setting of severe lymphedema 03/26/2022; patient is here for wounds on his bilateral lower legs medial  aspect History of Present Illness (HPI) ADMISSION 02/23/2020 Patient is a 77 year old man who lives in [place] who arrives accompanied by his wife. He has a history of chronic lymphedema and venous insufficiency in his bilateral lower legs which may have something to do that with having a history of DVT as well as being treated for prostate cancer. In any case he recently got compression pumps at home but compliance has been an issue here. He has compression stockings however they are probably not sufficient enough to control swelling. They tell us that things deteriorated for him in late August he was admitted to [facility] for 7 days. This was with cellulitis I think of his bilateral lower legs. Discharge he was noted to have wounds on his bilateral lower legs. He was discharged on Bactrim. They tried to get him home health through [facility] of course they declined him. His wife is been wrapping these applying some form of silver foam dressing. He has a history of wounds before although nothing that would not heal with basic home topical dressings. He has 2 areas on the left medial, left anterior and left lateral and a smaller area on the right medial. All of these have considerable depth. Past medical history includes iron deficiency anemia, lymphedema followed by the rehab center at [facility] with lymphedema wraps I believe, DVT on chronic anticoagulation, prostate cancer, chronic venous insufficiency, hypertension. As mentioned he has compression pumps but does not use them. ABIs in our clinic were noncompressible bilaterally 10/14; patient with severe bilateral lymphedema right greater than left. He came in with bilateral lower extremity wounds left greater than right. Even though the right side has more of the edema most of the wounds here almost closed on the right medial. He has 3 remaining wounds on the left We have been using silver alginate under 4-layer  compression I have been trying to get him to be compliant with his external compression pumps 10/21; patient with 3 small wounds on the left leg and 1 on the right medial in the setting of severe lymphedema and chronic venous insufficiency. We have been using silver alginate under 4-layer compression he is using his external compression pumps twice a day 11/4; ARTERIAL STUDIES on the right show an ABI of 1.02 TBI of 0.858 with biphasic waveforms on the left 0.98 with a TBI of 0.55 and biphasic waveforms. Does not look like he has significant arterial disease. We are treating him for lymphedema he has compression pumps. He has punched-out areas on the left anterior left lateral and right medial lower extremities 11/11; after we obtained his arterial studies I put him in 4 layer compression. He is using his compression pumps probably once a day although I have asked him to do twice. Primary dressing to the wound is silver collagen he has severe lymphedema likely secondary to chronic  venous insufficiency. Wounds on the left lateral, left medial and left anterior and a small area on the right medial 12/2; the area on the right anterior lower leg has healed. We initially thought that the area medially had healed as well however when her discharge nurse came in she detected fluid in the wound simply opened up. This is actually worse than I remember this pain. The area on the left lateral potentially slightly smaller He is also complaining about pain in his left hand he says that this is actually been getting some better he has been using topical creams on this. She asked that I look at this 12/9 after last weeks issues we have 2 wounds one on the right medial lower leg and 1 on the left lateral. Both of these are in the same condition. I think because of thickened skin secondary to chronic lymphedema these wounds actually have depth of almost 0.8 cm. 12/16; the patient has 2 small but deep wounds one on  the right medial and one on the left lateral. The right medial is actually the worst of these. He arrives in clinic today with absolutely terrible edema in the right leg apparently his 4-layer wrap fell down to just above his ankle he did not think about this he is Searcy, [name] ([id number]) [phone].pdf Page 6 of 8 apparently been continuing to use his compression pump twice a day. The left leg looks a lot better. 05/09/2020 upon evaluation today patient appears to be doing decently well in regard to his wounds. Everything is measuring smaller the right leg still has a little bit deeper wound in the left seems to be almost completely healed in my opinion I am very pleased in general with how things are progressing. He has a 4- layer compression wrap we have been using endoform today we will probably have to use collagen just based on the fact that we do not have endoform it is on order. 1/6; the patient's wound on the left lateral lower leg has healed. Still has 1 on the right medial. 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 Using compression pumps at home twice a day. 1/13; left lateral lower leg is still healed. He has a deep punched out rectangular shaped wound on the right medial calf. Looking down at this it appears that he is attempting to epithelialize around the edges of the wound and on the base as well. His edema is reasonably well controlled we have been using collagen with absolutely no effect 1/20; left lateral lower leg remains closed he has extremitease stockings. The area on the right medial calf I aggressively debrided last week measures larger but the surface looks better. We have been using Hydrofera Blue. We ran Oasis through his insurance but we have not seen the results of this 1/27; left lower leg wound with chronic venous insufficiency and secondary lymphedema. I did aggressive debridement on this last week the wound seems  to have come in healthy looking surface using Hydrofera Blue. He was denied for Oasis 2/3; small divot in the right medial lower leg. Under illumination the walls of this divot are epithelialized however the base has slough which I removed with a curette we have been using Hydrofera Blue 2/10 small divot on the right medial lower leg pinpoint illumination at the base of this cone-shaped wound. We have been using Hydrofera Blue but I will switch to calcium alginate this week 2/17; the small divot on the right medial lower  leg is fully epithelialized. There is no visible open area under illumination. He has his own stocking for the right leg similar to the one he has been wearing on the left. 03/26/2022; READMISSION This is a now 77 year old man that we had in the clinic from 02/23/2020 through 07/05/2020. At that point he had bilateral lower extremity wounds left greater than right in the setting of severe lymphedema. He had already obtained compression pumps ordered for him I think from the wound care clinic in [facility] so I do not really have record of what he has been using. He claims to be using them once a day but there is a problem with the sleeve on the left leg. About 2 weeks ago he was hospitalized from 03/11/2022 through [id number] with diastolic congestive heart failure. 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 He was diuresed. Developed some prerenal azotemia and he has not been taking any diuretics currently. He has not been putting stockings on his legs since he got out of hospital and still has his legs dependent for long periods. Past medical history history of prostate cancer treated with prostatectomy and radiation this was apparently about 8 years ago, history of DVT on chronic Coumadin, history of lymphedema was managed for a while at the clinic in [place]. History of inguinal hernia repair in September 22, hypertension,  stage IIIb chronic renal failure ABIs today were noncompressible on the right 1.12 on the left 04-02-2022 upon evaluation today patient appears to be doing well currently in regard to his legs I do feel like both areas that are draining are actually much drier than they were in the picture last week although the left is drier than the right. He is tolerating the 4-layer compression wraps at this point he did contact the pump company and they are actually working on getting him a new compression sleeve for one of his legs which have previously popped and was not functioning properly. 04-23-2022 upon evaluation today patient appears to be doing well currently in regard to his wounds on the legs. I am actually very pleased with where things stand and I do feel like that we are headed in the right direction. Fortunately there is no sign of active infection locally or systemically at this time. 05-07-2022 upon evaluation today patient appears to be doing well currently in regard to his wounds in fact things are showing signs of improvement which is good news I do not see too much that actually appears to be open and I am very pleased in that regard. No fevers, chills, nausea, vomiting, or diarrhea. 05-21-2022 upon evaluation today patient appears to be doing somewhat poorly in regard to drainage of his lower extremities bilaterally. The right is greater than left as far as the weeping area. Nonetheless it seems to be getting worse not better. He actually has pitting edema which is at least 2+ to the thighs and I am concerned about the fact that he is may be fluid overloaded in general and that is the reason why we cannot get this under control. I know he is not using his pumps all the time because he actually told the nurse that he was either going to pump or he was going to use his fluid pills but not do both. For that reason I do think that he needs to be really doing both in order to get the fluid out as  effectively as possible  obviously with the 4-layer compression wraps were doing as much as we can from a compression standpoint but it is really not enough. He tells me that he elevates his leg is much as he can in between pumping and other activity throughout the day. 05-28-2022 upon evaluation today patient appears to be doing better in regard to his wounds although the measurements may be a little bit larger this is a very difficult wound to heal it is very indistinct in a lot of areas. Nonetheless there is can be some need for sharp debridement in regard to both medial and lateral legs. Fortunately I see no signs of active infection locally nor systemically at this time. No fevers, chills, nausea, vomiting, or diarrhea. 06-04-2022 upon evaluation today patient appears to be doing poorly in general in regard to the wounds on his legs. He still continues to have a tremendous amount of fluid not just in the lower portion of his leg but to be honest his thigh where he has 2-3+ pitting edema in the thigh as well. Unfortunately I do not know that we will be able to get this healed effectively and keep it healed on the lower extremities unless he gets the overall fluid situation taking and under control. Fortunately I do not see any signs of infection locally nor systemically which is great news. He just seems to be very fluid overloaded. Objective Constitutional Obese and well-hydrated in no acute distress. Vitals Time Taken: 1:52 PM, Height: 74 in, Weight: 250 lbs, BMI: 32.1, Temperature: 97.9 F, Pulse: 108 bpm, Respiratory Rate: 18 breaths/min, Blood Pressure: 196/76 mmHg. Respiratory normal breathing without difficulty. Psychiatric Cradle, [name] ([id number]) [phone].pdf Page 7 of 8 this patient is able to make decisions and demonstrates good insight into disease process. Alert and Oriented x 3. pleasant and cooperative. General Notes: Patient's legs appear to be  weeping there does not appear to be any signs of infection in general I think the biggest issue here is just his overall fluid overload status. Integumentary (Hair, Skin) Wound #6 status is Open. Original cause of wound was Gradually Appeared. The date acquired was: 03/05/2022. The wound has been in treatment 10 weeks. The wound is located on the Right,Medial Lower Leg. The wound measures 15cm length x 14.5cm width x 0.1cm depth; 170.824cm^2 area and 17.082cm^3 volume. There is Fat Layer (Subcutaneous Tissue) exposed. There is no tunneling or undermining noted. There is a large amount of purulent drainage noted. The wound margin is distinct with the outline attached to the wound base. There is no granulation within the wound bed. There is a large (67-100%) amount of necrotic tissue within the wound bed including Adherent Slough. The periwound skin appearance did not exhibit: Callus, Crepitus, Excoriation, Induration, Rash, Scarring, Dry/Scaly, Maceration, Atrophie Blanche, Cyanosis, Ecchymosis, Hemosiderin Staining, Mottled, Pallor, Rubor, Erythema. Wound #7 status is Open. Original cause of wound was Gradually Appeared. The date acquired was: 03/05/2022. The wound has been in treatment 10 weeks. The wound is located on the Left,Medial Lower Leg. The wound measures 4cm length x 3cm width x 0.1cm depth; 9.425cm^2 area and 0.942cm^3 volume. There is Fat Layer (Subcutaneous Tissue) exposed. There is no tunneling or undermining noted. There is a medium amount of serosanguineous drainage noted. The wound margin is distinct with the outline attached to the wound base. There is medium (34-66%) red, pink granulation within the wound bed. There is a medium (34-66%) amount of necrotic tissue within the wound bed including Adherent Slough. The periwound skin appearance  did not exhibit: Callus, Crepitus, Excoriation, Induration, Rash, Scarring, Dry/Scaly, Maceration, Atrophie Blanche, Cyanosis, Ecchymosis,  Hemosiderin Staining, Mottled, Pallor, Rubor, Erythema. 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 There was a Four Layer Compression Therapy Procedure by Rhae Hammock, RN. Post procedure Diagnosis Wound #6: Same as Pre-Procedure Wound #7 Pre-procedure diagnosis of Wound #7 is a Lymphedema located on the Left,Medial Lower Leg . There was a Four Layer Compression Therapy Procedure by Rhae Hammock, RN. Post procedure Diagnosis Wound #7: Same as Pre-Procedure Plan Follow-up Appointments: Return Appointment in 1 week. Margarita Grizzle on Wednesdays 06/11/22 @ 12:45 Rm # 8 w/ [name] (already has appt.) Return Appointment in 2 weeks. - w/ Shaurya Rawdon ST on WEdnesday's (go ahead and schedule appt.) one Other: - ****ask Cardiology about fluid control in both legs- thighs and lower legs. Ensure to ask how much to drink fluids. Bathing/ Shower/ Hygiene: May shower with protection but do not get wound dressing(s) wet. Protect dressing(s) with water repellant cover (for example, large plastic bag) or a cast cover and may then take shower. Edema Control - Lymphedema / SCD / Other: Lymphedema Pumps. Use Lymphedema pumps on leg(s) 2-3 times a day for 45-60 minutes. If wearing any wraps or hose, do not remove them. Continue exercising as instructed. Elevate legs to the level of the heart or above for 30 minutes daily and/or when sitting for 3-4 times a day throughout the day. Avoid standing for long periods of time. Exercise regularly WOUND #6: - Lower Leg Wound Laterality: Right, Medial Cleanser: Soap and Water 1 x Per Week/7 Days Discharge Instructions: May shower and wash wound with dial  antibacterial soap and water prior to dressing change. Peri-Wound Care: Sween Lotion (Moisturizing lotion) 1 x Per Week/7 Days Discharge Instructions: Apply moisturizing lotion as directed Prim Dressing: Sorbalgon AG Dressing, 4x4 (in/in) 1 x Per Week/7 Days ary Discharge Instructions: Apply to wound bed as instructed Secondary Dressing: ABD Pad, 5x9 1 x Per Week/7 Days Discharge Instructions: Apply over primary dressing as directed. Secondary Dressing: Woven Gauze Sponge, Non-Sterile 4x4 in 1 x Per Week/7 Days Discharge Instructions: Apply over primary dressing as directed. Com pression Wrap: FourPress (4 layer compression wrap) 1 x Per Week/7 Days Discharge Instructions: Apply four layer compression as directed. May also use Miliken CoFlex 2 layer compression system as alternative. WOUND #7: - Lower Leg Wound Laterality: Left, Medial Cleanser: Soap and Water 1 x Per Week/7 Days Discharge Instructions: May shower and wash wound with dial antibacterial soap and water prior to dressing change. Chaney, [name] ([id number]) [phone].pdf Page 8 of 8 Peri-Wound Care: Sween Lotion (Moisturizing lotion) 1 x Per Week/7 Days Discharge Instructions: Apply moisturizing lotion as directed Prim Dressing: Sorbalgon AG Dressing, 4x4 (in/in) 1 x Per Week/7 Days ary Discharge Instructions: Apply to wound bed as instructed Secondary Dressing: ABD Pad, 5x9 1 x Per Week/7 Days Discharge Instructions: Apply over primary dressing as directed. Secondary Dressing: Woven Gauze Sponge, Non-Sterile 4x4 in 1 x Per Week/7 Days Discharge Instructions: Apply over primary dressing as directed. Com pression Wrap: FourPress (4 layer compression wrap) 1 x Per Week/7 Days Discharge Instructions: Apply four layer compression as directed. May also use Miliken CoFlex 2 layer compression system as alternative. 1. Would  recommend currently based on what we are seeing that we go ahead and have the patient  continue with the compression wrapping. Unfortunately he is continuing to have significant lower extremity edema. With that being said I do think that this is a much more global issue for him and that it is not just his lower portion of his legs but his thighs where he has pitting edema as well I feel like he may be fluid overloaded in general. 2. I am going to recommend that the patient should continue to elevate his legs much as possible as well as using the compression pumps 2 times a day tells me he is doing that I am not certain how frequent that is happening however. 3. I am also going to suggest the patient continue again with the 4-layer compression wrap or using a silver alginate dressing at least this will get a switch to as of today. We will see patient back for reevaluation in 1 week here in the clinic. If anything worsens or changes patient will contact our office for additional recommendations. Electronic Signature(s) Signed: 06/04/2022 3:52:19 PM By: Worthy Keeler PA-C Entered By: Worthy Keeler on 06/04/2022 15:52:18 -------------------------------------------------------------------------------- SuperBill Details Patient Name: Date of Service: Stracener, [name] 06/04/2022 Medical Record Number: [id number] Patient Account Number: [id number] Date of Birth/Sex: Treating RN: 30-Mar-1946 (77 y.o. [name], [name] Primary Care Provider: [name] Other Clinician: Referring Provider: Treating Provider/Extender: [name] Weeks in Treatment: 10 Diagnosis Coding ICD-10 Codes Code Description [phone] Non-pressure chronic ulcer of other part of left lower leg with other specified severity 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 other part of right lower leg with other specified severity I89.0 Lymphedema, not elsewhere classified Facility Procedures : CPT4: Code [id number] foot Description: 1 BILATERAL: Application of multi-layer venous compression  system; leg (below knee), including ankle and . Modifier: Quantity: 1 Physician Procedures : CPT4 Code Description Modifier [id number] - WC PHYS LEVEL 3 - EST PT ICD-10 Diagnosis Description 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 I89.0 Lymphedema, not elsewhere classified Quantity: 1 Electronic Signature(s) Signed: 06/04/2022 3:53:08 PM By: Worthy Keeler PA-C Entered By: Worthy Keeler on 06/04/2022 15:53:08

## 2022-06-11 ENCOUNTER — Encounter (HOSPITAL_BASED_OUTPATIENT_CLINIC_OR_DEPARTMENT_OTHER): Payer: Medicare Other | Admitting: Physician Assistant

## 2022-06-11 DIAGNOSIS — I87333 Chronic venous hypertension (idiopathic) with ulcer and inflammation of bilateral lower extremity: Secondary | ICD-10-CM | POA: Diagnosis not present

## 2022-06-11 NOTE — Progress Notes (Signed)
[name] ([id number]) [phone].pdf Page 1 of 9 Visit Report for 06/11/2022 Chief Complaint Document Details Patient Name: Date of Service: [name] 06/11/2022 12:45 PM Medical Record Number: [id number] Patient Account Number: [id number] Date of Birth/Sex: Treating RN: 08/17/1945 (77 y.o. M) Primary Care Provider: [name] Other Clinician: Referring Provider: Treating Provider/Extender: [name] Weeks in Treatment: 11 Information Obtained from: Patient Chief Complaint 02/23/2020; patient is here for wounds on his bilateral lower legs in the setting of severe lymphedema 03/26/2022; patient is here for wounds on his bilateral lower legs medial aspect Electronic Signature(s) Signed: 06/11/2022 3:41:44 PM By: Worthy Keeler PA-C Entered By: Worthy Keeler on 06/11/2022 15:41:44 -------------------------------------------------------------------------------- HPI Details Patient Name: Date of Service: [name] 06/11/2022 12:45 PM Medical Record Number: [id number] Patient Account Number: [id number] Date of Birth/Sex: Treating RN: [birth year] (77 y.o. M) Primary Care Provider: [name] Other Clinician: Referring Provider: Treating Provider/Extender: [name] Weeks in Treatment: 11 History of Present Illness HPI Description: ADMISSION 02/23/2020 Patient is a 77 year old man who lives in [place] who arrives accompanied by his wife. He has a history of chronic lymphedema and venous insufficiency in his bilateral lower legs which may have something to do that with having a history of DVT as well as being treated for prostate cancer. In any case he recently got compression pumps at home but compliance has been an issue here. He has compression stockings however they are probably not sufficient enough to control swelling. They tell us that things deteriorated for him in late August he was  admitted to [facility] for 7 days. This was with cellulitis I think of his bilateral lower legs. Discharge he was noted to have wounds on his bilateral lower legs. He was discharged on Bactrim. They tried to get him home health through [facility] part C of course they declined him. His wife is been wrapping these applying some form of silver foam dressing. He has a history of wounds before although nothing that would not heal with basic home topical dressings. He has 2 areas on the left medial, left anterior and left lateral and a smaller area on the right medial. All of these have considerable depth. Past medical history includes iron deficiency anemia, lymphedema followed by the rehab center at [facility] with lymphedema wraps I believe, DVT on chronic anticoagulation, prostate cancer, chronic venous insufficiency, hypertension. As mentioned he has compression pumps but does not use them. ABIs in our clinic were noncompressible bilaterally 10/14; patient with severe bilateral lymphedema right greater than left. He came in with bilateral lower extremity wounds left greater than right. Even though the right side has more of the edema most of the wounds here almost closed on the right medial. He has 3 remaining wounds on the left We have been using silver alginate under 4-layer compression I have been trying to get him to be compliant with his external compression pumps 10/21; patient with 3 small wounds on the left leg and 1 on the right medial in the setting of severe lymphedema and chronic venous insufficiency. We have been using silver alginate under 4-layer compression he is using his external compression pumps twice a day 11/4; ARTERIAL STUDIES on the right show an ABI of 1.02 TBI of 0.858 with biphasic waveforms on the left 0.98 with a TBI of 0.55 and biphasic waveforms. Does not look like he has significant arterial disease. We are treating  him for lymphedema he has compression  pumps. He has punched-out areas on the left anterior left lateral and right medial lower extremities 11/11; after we obtained his arterial studies I put him in 4 layer compression. He is using his compression pumps probably once a day although I have asked Brocker, Kasandra [name] ([id number]) [phone].pdf Page 2 of 9 him to do twice. Primary dressing to the wound is silver collagen he has severe lymphedema likely secondary to chronic venous insufficiency. Wounds on the left lateral, left medial and left anterior and a small area on the right medial 12/2; the area on the right anterior lower leg has healed. We initially thought that the area medially had healed as well however when her discharge nurse came in she detected fluid in the wound simply opened up. This is actually worse than I remember this pain. The area on the left lateral potentially slightly smaller He is also complaining about pain in his left hand he says that this is actually been getting some better he has been using topical creams on this. She asked that I look at this 12/9 after last weeks issues we have 2 wounds one on the right medial lower leg and 1 on the left lateral. Both of these are in the same condition. I think because of thickened skin secondary to chronic lymphedema these wounds actually have depth of almost 0.8 cm. 12/16; the patient has 2 small but deep wounds one on the right medial and one on the left lateral. The right medial is actually the worst of these. He arrives in clinic today with absolutely terrible edema in the right leg apparently his 4-layer wrap fell down to just above his ankle he did not think about this he is apparently been continuing to use his compression pump twice a day. The left leg looks a lot better. 05/09/2020 upon evaluation today patient appears to be doing decently well in regard to his wounds. Everything is measuring smaller the right leg still has a little bit deeper  wound in the left seems to be almost completely healed in my opinion I am very pleased in general with how things are progressing. He has a 4- layer compression wrap we have been using endoform today we will probably have to use collagen just based on the fact that we do not have endoform it is on order. 1/6; the patient's wound on the left lateral lower leg has healed. Still has 1 on the right medial. 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 Using compression pumps at home twice a day. 1/13; left lateral lower leg is still healed. He has a deep punched out rectangular shaped wound on the right medial calf. Looking down at this it appears that he is attempting to epithelialize around the edges of the wound and on the base as well. His edema is reasonably well controlled we have been using collagen with absolutely no effect 1/20; left lateral lower leg remains closed he has extremitease stockings. The area on the right medial calf I aggressively debrided last week measures larger but the surface looks better. We have been using Hydrofera Blue. We ran Oasis through his insurance but we have not seen the results of this 1/27; left lower leg wound with chronic venous insufficiency and secondary lymphedema. I did aggressive debridement on this last week the wound seems to have come in healthy looking surface using Hydrofera Blue. He was denied for Oasis 2/3; small divot in the right medial  lower leg. Under illumination the walls of this divot are epithelialized however the base has slough which I removed with a curette we have been using Hydrofera Blue 2/10 small divot on the right medial lower leg pinpoint illumination at the base of this cone-shaped wound. We have been using Hydrofera Blue but I will switch to calcium alginate this week 2/17; the small divot on the right medial lower leg is fully epithelialized. There is no visible open area under illumination. He has his own  stocking for the right leg similar to the one he has been wearing on the left. 03/26/2022; READMISSION This is a now 77 year old man that we had in the clinic from 02/23/2020 through 07/05/2020. At that point he had bilateral lower extremity wounds left greater than right in the setting of severe lymphedema. He had already obtained compression pumps ordered for him I think from the wound care clinic in [facility] so I do not really have record of what he has been using. He claims to be using them once a day but there is a problem with the sleeve on the left leg. About 2 weeks ago he was hospitalized from 03/11/2022 through [id number] with diastolic congestive heart failure. 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 He was diuresed. Developed some prerenal azotemia and he has not been taking any diuretics currently. He has not been putting stockings on his legs since he got out of hospital and still has his legs dependent for long periods. Past medical history history of prostate cancer treated with prostatectomy and radiation this was apparently about 8 years ago, history of DVT on chronic Coumadin, history of lymphedema was managed for a while at the clinic in [place]. History of inguinal hernia repair in September 22, hypertension, stage IIIb chronic renal failure ABIs today were noncompressible on the right 1.12 on the left 04-02-2022 upon evaluation today patient appears to be doing well currently in regard to his legs I do feel like both areas that are draining are actually much drier than they were in the picture last week although the left is drier than the right. He is tolerating the 4-layer compression wraps at this point he did contact the pump company and they are actually working on getting him a new compression sleeve for one of his legs which have previously popped and was not functioning properly. 04-23-2022 upon evaluation today  patient appears to be doing well currently in regard to his wounds on the legs. I am actually very pleased with where things stand and I do feel like that we are headed in the right direction. Fortunately there is no sign of active infection locally or systemically at this time. 05-07-2022 upon evaluation today patient appears to be doing well currently in regard to his wounds in fact things are showing signs of improvement which is good news I do not see too much that actually appears to be open and I am very pleased in that regard. No fevers, chills, nausea, vomiting, or diarrhea. 05-21-2022 upon evaluation today patient appears to be doing somewhat poorly in regard to drainage of his lower extremities bilaterally. The right is greater than left as far as the weeping area. Nonetheless it seems to be getting worse not better. He actually has pitting edema which is at least 2+ to the thighs and I am concerned about the fact that he is may be fluid overloaded in general and that is the  reason why we cannot get this under control. I know he is not using his pumps all the time because he actually told the nurse that he was either going to pump or he was going to use his fluid pills but not do both. For that reason I do think that he needs to be really doing both in order to get the fluid out as effectively as possible obviously with the 4-layer compression wraps were doing as much as we can from a compression standpoint but it is really not enough. He tells me that he elevates his leg is much as he can in between pumping and other activity throughout the day. 05-28-2022 upon evaluation today patient appears to be doing better in regard to his wounds although the measurements may be a little bit larger this is a very difficult wound to heal it is very indistinct in a lot of areas. Nonetheless there is can be some need for sharp debridement in regard to both medial and lateral legs. Fortunately I see no signs  of active infection locally nor systemically at this time. No fevers, chills, nausea, vomiting, or diarrhea. 06-04-2022 upon evaluation today patient appears to be doing poorly in general in regard to the wounds on his legs. He still continues to have a tremendous amount of fluid not just in the lower portion of his leg but to be honest his thigh where he has 2-3+ pitting edema in the thigh as well. Unfortunately I do not know that we will be able to get this healed effectively and keep it healed on the lower extremities unless he gets the overall fluid situation taking and under control. Fortunately I do not see any signs of infection locally nor systemically which is great news. He just seems to be very fluid overloaded. 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 He has been tolerating the dressing changes with the compression wraps without complication. Fortunately I do not see any evidence of infection at this time which is great news. No fevers, chills, nausea, vomiting, or diarrhea. Electronic Signature(s) Signed: 06/11/2022 4:53:05 PM By: Worthy Keeler PA-C Entered By: Worthy Keeler on 06/11/2022 16:53:05 Belton, Kasandra [name] ([id number]) [id number] Page 3 of 9 -------------------------------------------------------------------------------- Physical Exam Details Patient Name: Date of Service: Gish, DA [name] 06/11/2022 12:45 PM Medical Record Number: [id number] Patient Account Number: [id number] Date of Birth/Sex: Treating RN: 01-Jan-1946 (77 y.o. M) Primary Care Provider: [name] Other Clinician: Referring Provider: Treating Provider/Extender: [name] Weeks in Treatment: 25 Constitutional Well-nourished and well-hydrated in no acute distress. Respiratory normal breathing without difficulty. Psychiatric this  patient is able to make decisions and demonstrates good insight into disease process. Alert and Oriented x 3. pleasant and cooperative. Notes Upon inspection patient's wound bed actually showed signs of good granulation epithelization at this point. Fortunately I do not see any evidence of active infection locally nor systemically at this point which is great news. Overall I do believe that he is making progress this is slow but nonetheless we are seeing some good improvement from last week to this. Electronic Signature(s) Signed: 06/11/2022 4:53:33 PM By: Worthy Keeler PA-C Entered By: Worthy Keeler on 06/11/2022 16:53:33 -------------------------------------------------------------------------------- Physician Orders Details Patient Name: Date of Service: Milillo, DA [name] 06/11/2022 12:45 PM Medical Record Number: [id number] Patient Account Number: [id number] Date of Birth/Sex: Treating RN: 01-Apr-1946 (77 y.o. [name] Primary Care Provider:  [name] Other Clinician: Referring Provider: Treating Provider/Extender: [name] Weeks in Treatment: 64 Verbal / Phone Orders: No Diagnosis Coding ICD-10 Coding Code Description [phone] Non-pressure chronic ulcer of other part of left lower leg with other specified severity 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 other part of right lower leg with other specified severity I89.0 Lymphedema, not elsewhere classified Follow-up Appointments ppointment in 1 week. Margarita Grizzle on Wednesdays 06/18/2022 1245 Return A ppointment in 2 weeks. - Lanae Crumbly Wednesdays 1330pm 06/25/2022 Return A Other: - Bring in compression stockings. Placing an order from [facility] for compression stockings. Bathing/ Shower/ Hygiene May shower with protection but do not get wound dressing(s) wet. Protect dressing(s) with water repellant cover (for example, large plastic bag) or a cast cover and may then take shower. Edema Control -  Lymphedema / SCD / Other Lymphedema Pumps. Use Lymphedema pumps on leg(s) 2-3 times a day for 45-60 minutes. If wearing any wraps or hose, do not remove them. Continue exercising as instructed. Elevate legs to the level of the heart or above for 30 minutes daily and/or when sitting for 3-4 times a day throughout the day. Olexa, Kasandra [name] ([id number]) [phone].pdf Page 4 of 9 Avoid standing for long periods of time. Exercise regularly Compression stocking or Garment 20-30 mm/Hg pressure to: - will place an order assure medi compression stockings from [place]. Assure medical compression stockings open toe beige thigh with topband 20-30 size XL for both legs. Wound Treatment Wound #6 - Lower Leg Wound Laterality: Right, Medial Cleanser: Soap and Water 1 x Per Week/7 Days Discharge Instructions: May shower and wash wound with dial antibacterial soap and water prior to dressing change. Peri-Wound Care: Sween Lotion (Moisturizing lotion) 1 x Per Week/7 Days Discharge Instructions: Apply moisturizing lotion as directed Prim Dressing: Sorbalgon AG Dressing, 4x4 (in/in) 1 x Per Week/7 Days ary Discharge Instructions: Apply to wound bed as instructed Secondary Dressing: ABD Pad, 5x9 1 x Per Week/7 Days Discharge Instructions: Apply over primary dressing as directed. Secondary Dressing: Woven Gauze Sponge, Non-Sterile 4x4 in 1 x Per Week/7 Days Discharge Instructions: Apply over primary dressing as directed. Compression Wrap: Unnaboot w/Calamine, 4x10 (in/yd) 1 x Per Week/7 Days Discharge Instructions: Apply Unnaboot as directed. Wound #7 - Lower Leg Wound Laterality: Left, Medial Cleanser: Soap and Water 1 x Per Week/7 Days Discharge Instructions: May shower and wash wound with dial antibacterial soap and water prior to dressing change. Peri-Wound Care: Sween Lotion (Moisturizing lotion) 1 x Per Week/7 Days Discharge Instructions: Apply moisturizing lotion as directed Prim  Dressing: Sorbalgon AG Dressing, 4x4 (in/in) 1 x Per Week/7 Days ary Discharge Instructions: Apply to wound bed as instructed Secondary Dressing: ABD Pad, 5x9 1 x Per Week/7 Days Discharge Instructions: Apply over primary dressing as directed. Secondary Dressing: Woven Gauze Sponge, Non-Sterile 4x4 in 1 x Per Week/7 Days Discharge Instructions: Apply over primary dressing as directed. Compression Wrap: Unnaboot w/Calamine, 4x10 (in/yd) 1 x Per Week/7 Days Discharge Instructions: Apply Unnaboot as directed. Wound #8 - Lower Leg Wound Laterality: Left, Anterior Cleanser: Soap and Water 1 x Per Week/7 Days Discharge Instructions: May shower and wash wound with dial antibacterial soap and water prior to dressing change. Peri-Wound Care: Sween Lotion (Moisturizing lotion) 1 x Per Week/7 Days Discharge Instructions: Apply moisturizing lotion as directed Prim Dressing: Sorbalgon AG Dressing, 4x4 (in/in) 1 x Per Week/7 Days ary Discharge Instructions: Apply to wound bed as instructed Secondary Dressing: ABD Pad, 5x9 1 x Per Week/7  Days Discharge Instructions: Apply over primary dressing as directed. Secondary Dressing: Woven Gauze Sponge, Non-Sterile 4x4 in 1 x Per Week/7 Days Discharge Instructions: Apply over primary dressing as directed. Compression Wrap: Unnaboot w/Calamine, 4x10 (in/yd) 1 x Per Week/7 Days Discharge Instructions: Apply Unnaboot as directed. Electronic Signature(s) Unsigned Entered By: Deon Pilling on 06/11/2022 16:15:05 Signature(s): Dubose, Kasandra [name] ([id number]) [id number] Date(s): [id number] Page 5 of 9 -------------------------------------------------------------------------------- Problem List Details Patient Name: Date of Service: Hippe, [name] 06/11/2022 12:45 PM Medical Record Number: [id number] Patient Account Number: [id number] Date of Birth/Sex: Treating RN: 1945/12/09 (77 y.o. M) Primary Care Provider: [name] Other  Clinician: Referring Provider: Treating Provider/Extender: [name] in Treatment: 11 Active Problems ICD-10 Encounter Code Description Active Date MDM Diagnosis I87.333 Chronic venous hypertension (idiopathic) with ulcer and inflammation of 06/11/2022 No Yes bilateral lower extremity I89.0 Lymphedema, not elsewhere classified 03/26/2022 No Yes L97.828 Non-pressure chronic ulcer of other part of left lower leg with other specified 03/26/2022 No Yes severity 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 other part of right lower leg with other specified 03/26/2022 No Yes severity Inactive Problems Resolved Problems Electronic Signature(s) Signed: 06/11/2022 4:50:30 PM By: Worthy Keeler PA-C Previous Signature: 06/11/2022 3:41:39 PM Version By: Worthy Keeler PA-C Entered By: Worthy Keeler on 06/11/2022 16:50:30 -------------------------------------------------------------------------------- Progress Note Details Patient Name: Date of Service: Reim, DA [name] 06/11/2022 12:45 PM Medical Record Number: [id number] Patient Account Number: [id number] Date of Birth/Sex: Treating RN: 20-Sep-1945 (77 y.o. M) Primary Care Provider: [name] Other Clinician: Referring Provider: Treating Provider/Extender: [name] Weeks in Treatment: 11 Subjective Chief Complaint Information obtained from Patient 02/23/2020; patient is here for wounds on his bilateral lower legs in the setting of severe lymphedema 03/26/2022; patient is here for wounds on his bilateral lower legs medial aspect History of Present Illness (HPI) ADMISSION Qin, Skylur ([id number]) [id number] Page 6 of 9 02/23/2020 Patient is a 77 year old man who lives in [place] who arrives accompanied by his wife. He has a history of chronic lymphedema and venous insufficiency in his bilateral lower legs which may have something to do that with having a  history of DVT as well as being treated for prostate cancer. In any case he recently got compression pumps at home but compliance has been an issue here. He has compression stockings however they are probably not sufficient enough to control swelling. They tell us that things deteriorated for him in late August he was admitted to [facility] for 7 days. This was with cellulitis I think of his bilateral lower legs. Discharge he was noted to have wounds on his bilateral lower legs. He was discharged on Bactrim. They tried to get him home health through [facility] part C of course they declined him. His wife is been wrapping these applying some form of silver foam dressing. He has a history of wounds before although nothing that would not heal with basic home topical dressings. He has 2 areas on the left medial, left anterior and left lateral and a smaller area on the right medial. All of these have considerable depth. Past medical history includes iron deficiency anemia, lymphedema followed by the rehab center at [facility] with lymphedema wraps I believe, DVT on chronic anticoagulation, prostate cancer, chronic venous insufficiency, hypertension. As mentioned he has compression pumps but does not use them. ABIs in our clinic were noncompressible bilaterally 10/14; patient with severe bilateral lymphedema right greater than left.  He came in with bilateral lower extremity wounds left greater than right. Even though the right side has more of the edema most of the wounds here almost closed on the right medial. He has 3 remaining wounds on the left We have been using silver alginate under 4-layer compression I have been trying to get him to be compliant with his external compression pumps 10/21; patient with 3 small wounds on the left leg and 1 on the right medial in the setting of severe lymphedema and chronic venous insufficiency. We have been using silver alginate under 4-layer compression  he is using his external compression pumps twice a day 11/4; ARTERIAL STUDIES on the right show an ABI of 1.02 TBI of 0.858 with biphasic waveforms on the left 0.98 with a TBI of 0.55 and biphasic waveforms. Does not look like he has significant arterial disease. We are treating him for lymphedema he has compression pumps. He has punched-out areas on the left anterior left lateral and right medial lower extremities 11/11; after we obtained his arterial studies I put him in 4 layer compression. He is using his compression pumps probably once a day although I have asked him to do twice. Primary dressing to the wound is silver collagen he has severe lymphedema likely secondary to chronic venous insufficiency. Wounds on the left lateral, left medial and left anterior and a small area on the right medial 12/2; the area on the right anterior lower leg has healed. We initially thought that the area medially had healed as well however when her discharge nurse came in she detected fluid in the wound simply opened up. This is actually worse than I remember this pain. The area on the left lateral potentially slightly smaller He is also complaining about pain in his left hand he says that this is actually been getting some better he has been using topical creams on this. She asked that I look at this 12/9 after last weeks issues we have 2 wounds one on the right medial lower leg and 1 on the left lateral. Both of these are in the same condition. I think because of thickened skin secondary to chronic lymphedema these wounds actually have depth of almost 0.8 cm. 12/16; the patient has 2 small but deep wounds one on the right medial and one on the left lateral. The right medial is actually the worst of these. He arrives in clinic today with absolutely terrible edema in the right leg apparently his 4-layer wrap fell down to just above his ankle he did not think about this he is apparently been continuing to use his  compression pump twice a day. The left leg looks a lot better. 05/09/2020 upon evaluation today patient appears to be doing decently well in regard to his wounds. Everything is measuring smaller the right leg still has a little bit deeper wound in the left seems to be almost completely healed in my opinion I am very pleased in general with how things are progressing. He has a 4- layer compression wrap we have been using endoform today we will probably have to use collagen just based on the fact that we do not have endoform it is on order. 1/6; the patient's wound on the left lateral lower leg has healed. Still has 1 on the right medial. 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 Using compression pumps at home twice a day. 1/13; left lateral lower leg is still healed. He has a deep punched out  rectangular shaped wound on the right medial calf. Looking down at this it appears that he is attempting to epithelialize around the edges of the wound and on the base as well. His edema is reasonably well controlled we have been using collagen with absolutely no effect 1/20; left lateral lower leg remains closed he has extremitease stockings. The area on the right medial calf I aggressively debrided last week measures larger but the surface looks better. We have been using Hydrofera Blue. We ran Oasis through his insurance but we have not seen the results of this 1/27; left lower leg wound with chronic venous insufficiency and secondary lymphedema. I did aggressive debridement on this last week the wound seems to have come in healthy looking surface using Hydrofera Blue. He was denied for Oasis 2/3; small divot in the right medial lower leg. Under illumination the walls of this divot are epithelialized however the base has slough which I removed with a curette we have been using Hydrofera Blue 2/10 small divot on the right medial lower leg pinpoint illumination at the base of this cone-shaped  wound. We have been using Hydrofera Blue but I will switch to calcium alginate this week 2/17; the small divot on the right medial lower leg is fully epithelialized. There is no visible open area under illumination. He has his own stocking for the right leg similar to the one he has been wearing on the left. 03/26/2022; READMISSION This is a now 77 year old man that we had in the clinic from 02/23/2020 through 07/05/2020. At that point he had bilateral lower extremity wounds left greater than right in the setting of severe lymphedema. He had already obtained compression pumps ordered for him I think from the wound care clinic in [facility] so I do not really have record of what he has been using. He claims to be using them once a day but there is a problem with the sleeve on the left leg. About 2 weeks ago he was hospitalized from 03/11/2022 through 74/12/1446 with diastolic congestive heart failure. 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 He was diuresed. Developed some prerenal azotemia and he has not been taking any diuretics currently. He has not been putting stockings on his legs since he got out of hospital and still has his legs dependent for long periods. Past medical history history of prostate cancer treated with prostatectomy and radiation this was apparently about 8 years ago, history of DVT on chronic Coumadin, history of lymphedema was managed for a while at the clinic in [place]. History of inguinal hernia repair in September 22, hypertension, stage IIIb chronic renal failure ABIs today were noncompressible on the right 1.12 on the left 04-02-2022 upon evaluation today patient appears to be doing well currently in regard to his legs I do feel like both areas that are draining are actually much drier than they were in the picture last week although the left is drier than the right. He is tolerating the 4-layer compression wraps  at this point he did contact the pump company and they are actually working on getting him a new compression sleeve for one of his legs which have previously popped and was not functioning properly. 04-23-2022 upon evaluation today patient appears to be doing well currently in regard to his wounds on the legs. I am actually very pleased with where things stand and I do feel like that we are headed in the right direction.  Fortunately there is no sign of active infection locally or systemically at this time. 05-07-2022 upon evaluation today patient appears to be doing well currently in regard to his wounds in fact things are showing signs of improvement which is good news I do not see too much that actually appears to be open and I am very pleased in that regard. No fevers, chills, nausea, vomiting, or diarrhea. Cristiano, Kasandra [name] ([id number]) [phone].pdf Page 7 of 9 05-21-2022 upon evaluation today patient appears to be doing somewhat poorly in regard to drainage of his lower extremities bilaterally. The right is greater than left as far as the weeping area. Nonetheless it seems to be getting worse not better. He actually has pitting edema which is at least 2+ to the thighs and I am concerned about the fact that he is may be fluid overloaded in general and that is the reason why we cannot get this under control. I know he is not using his pumps all the time because he actually told the nurse that he was either going to pump or he was going to use his fluid pills but not do both. For that reason I do think that he needs to be really doing both in order to get the fluid out as effectively as possible obviously with the 4-layer compression wraps were doing as much as we can from a compression standpoint but it is really not enough. He tells me that he elevates his leg is much as he can in between pumping and other activity throughout the day. 05-28-2022 upon evaluation today patient  appears to be doing better in regard to his wounds although the measurements may be a little bit larger this is a very difficult wound to heal it is very indistinct in a lot of areas. Nonetheless there is can be some need for sharp debridement in regard to both medial and lateral legs. Fortunately I see no signs of active infection locally nor systemically at this time. No fevers, chills, nausea, vomiting, or diarrhea. 06-04-2022 upon evaluation today patient appears to be doing poorly in general in regard to the wounds on his legs. He still continues to have a tremendous amount of fluid not just in the lower portion of his leg but to be honest his thigh where he has 2-3+ pitting edema in the thigh as well. Unfortunately I do not know that we will be able to get this healed effectively and keep it healed on the lower extremities unless he gets the overall fluid situation taking and under control. Fortunately I do not see any signs of infection locally nor systemically which is great news. He just seems to be very fluid overloaded. 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 He has been tolerating the dressing changes with the compression wraps without complication. Fortunately I do not see any evidence of infection at this time which is great news. No fevers, chills, nausea, vomiting, or diarrhea. Objective Constitutional Well-nourished and well-hydrated in no acute distress. Vitals Time Taken: 4:00 PM, Height: 74 in, Weight: 250 lbs, BMI: 32.1, Temperature: 98 F, Pulse: 84 bpm, Respiratory Rate: 20 breaths/min, Blood Pressure: 190/92 mmHg. Respiratory normal breathing without difficulty. Psychiatric this patient is able to make decisions and demonstrates good insight into disease process. Alert and Oriented x 3. pleasant and cooperative. General Notes: Upon inspection patient's wound  bed actually showed signs of good granulation epithelization at this point. Fortunately I  do not see any evidence of active infection locally nor systemically at this point which is great news. Overall I do believe that he is making progress this is slow but nonetheless we are seeing some good improvement from last week to this. Integumentary (Hair, Skin) Wound #6 status is Open. Original cause of wound was Gradually Appeared. The date acquired was: 03/05/2022. The wound has been in treatment 11 weeks. The wound is located on the Right,Medial Lower Leg. The wound measures 6.5cm length x 5cm width x 0.2cm depth; 25.525cm^2 area and 5.105cm^3 volume. There is Fat Layer (Subcutaneous Tissue) exposed. There is no tunneling or undermining noted. There is a medium amount of serosanguineous drainage noted. The wound margin is distinct with the outline attached to the wound base. There is no granulation within the wound bed. There is a large (67-100%) amount of necrotic tissue within the wound bed including Adherent Slough. The periwound skin appearance exhibited: Maceration. The periwound skin appearance did not exhibit: Callus, Crepitus, Excoriation, Induration, Rash, Scarring, Dry/Scaly, Atrophie Blanche, Cyanosis, Ecchymosis, Hemosiderin Staining, Mottled, Pallor, Rubor, Erythema. Wound #7 status is Open. Original cause of wound was Gradually Appeared. The date acquired was: 03/05/2022. The wound has been in treatment 11 weeks. The wound is located on the Left,Medial Lower Leg. The wound measures 5cm length x 6cm width x 0.1cm depth; 23.562cm^2 area and 2.356cm^3 volume. There is Fat Layer (Subcutaneous Tissue) exposed. There is no tunneling or undermining noted. There is a medium amount of serosanguineous drainage noted. The wound margin is distinct with the outline attached to the wound base. There is medium (34-66%) red, pink granulation within the wound bed. There is a medium (34-66%) amount of  necrotic tissue within the wound bed including Adherent Slough. The periwound skin appearance exhibited: Maceration. The periwound skin appearance did not exhibit: Callus, Crepitus, Excoriation, Induration, Rash, Scarring, Dry/Scaly, Atrophie Blanche, Cyanosis, Ecchymosis, Hemosiderin Staining, Mottled, Pallor, Rubor, Erythema. Wound #8 status is Open. Original cause of wound was Gradually Appeared. The date acquired was: 06/11/2022. The wound is located on the Left,Anterior Lower Leg. The wound measures 3cm length x 5cm width x 0.1cm depth; 11.781cm^2 area and 1.178cm^3 volume. There is Fat Layer (Subcutaneous Tissue) exposed. There is no tunneling or undermining noted. There is a medium amount of serosanguineous drainage noted. The wound margin is distinct with the outline attached to the wound base. There is medium (34-66%) red, pink granulation within the wound bed. There is a medium (34-66%) amount of necrotic tissue within the wound bed including Adherent Slough. The periwound skin appearance exhibited: Maceration. The periwound skin appearance did not exhibit: Callus, Crepitus, Excoriation, Induration, Rash, Scarring, Dry/Scaly, Atrophie Blanche, Cyanosis, Ecchymosis, Hemosiderin Staining, Mottled, Pallor, Rubor, Erythema. 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 There was a Haematologist Compression Therapy Procedure by Deon Pilling, RN. Post procedure Diagnosis Wound #6: Same as Pre-Procedure Wound  #7 Pre-procedure diagnosis of Wound #7 is a Lymphedema located on the Left,Medial Lower Leg . There was a Haematologist Compression Therapy Procedure by Deon Pilling, RN. Post procedure Diagnosis Wound #7: Same as Pre-Procedure Wound #8 Pre-procedure  diagnosis of Wound #8 is a Lymphedema located on the Left,Anterior Lower Leg . There was a Haematologist Compression Therapy Procedure by Deon Pilling, RN. Post procedure Diagnosis Wound #8: Same as Pre-Procedure Plan Follow-up Appointments: Return Appointment in 1 week. Margarita Grizzle on Wednesdays 06/18/2022 1245 Return Appointment in 2 weeks. - w/ Jeri Cos Wednesdays 1330pm 06/25/2022 Other: - Bring in compression stockings. Placing an order from [facility] for compression stockings. Bathing/ Shower/ Hygiene: May shower with protection but do not get wound dressing(s) wet. Protect dressing(s) with water repellant cover (for example, large plastic bag) or a cast cover and may then take shower. Edema Control - Lymphedema / SCD / Other: Lymphedema Pumps. Use Lymphedema pumps on leg(s) 2-3 times a day for 45-60 minutes. If wearing any wraps or hose, do not remove them. Continue exercising as instructed. Elevate legs to the level of the heart or above for 30 minutes daily and/or when sitting for 3-4 times a day throughout the day. Avoid standing for long periods of time. Exercise regularly Compression stocking or Garment 20-30 mm/Hg pressure to: - will place an order assure medi compression stockings from [employer]. [employer] medical compression stockings open toe beige thigh with topband 20-30 size XL for both legs. WOUND #6: - Lower Leg Wound Laterality: Right, Medial Cleanser: Soap and Water 1 x Per Week/7 Days Discharge Instructions: May shower and wash wound with dial antibacterial soap and water prior to dressing change. Peri-Wound Care: Sween Lotion (Moisturizing lotion) 1 x Per Week/7 Days Discharge Instructions: Apply moisturizing lotion as directed Prim  Dressing: Sorbalgon AG Dressing, 4x4 (in/in) 1 x Per Week/7 Days ary Discharge Instructions: Apply to wound bed as instructed Secondary Dressing: ABD Pad, 5x9 1 x Per Week/7 Days Discharge Instructions: Apply over primary dressing as directed. Secondary Dressing: Woven Gauze Sponge, Non-Sterile 4x4 in 1 x Per Week/7 Days Discharge Instructions: Apply over primary dressing as directed. Com pression Wrap: Unnaboot w/Calamine, 4x10 (in/yd) 1 x Per Week/7 Days Discharge Instructions: Apply Unnaboot as directed. WOUND #7: - Lower Leg Wound Laterality: Left, Medial Cleanser: Soap and Water 1 x Per Week/7 Days Discharge Instructions: May shower and wash wound with dial antibacterial soap and water prior to dressing change. Peri-Wound Care: Sween Lotion (Moisturizing lotion) 1 x Per Week/7 Days Discharge Instructions: Apply moisturizing lotion as directed Prim Dressing: Sorbalgon AG Dressing, 4x4 (in/in) 1 x Per Week/7 Days ary Discharge Instructions: Apply to wound bed as instructed Secondary Dressing: ABD Pad, 5x9 1 x Per Week/7 Days Discharge Instructions: Apply over primary dressing as directed. Secondary Dressing: Woven Gauze Sponge, Non-Sterile 4x4 in 1 x Per Week/7 Days Discharge Instructions: Apply over primary dressing as directed. Com pression Wrap: Unnaboot w/Calamine, 4x10 (in/yd) 1 x Per Week/7 Days Discharge Instructions: Apply Unnaboot as directed. WOUND #8: - Lower Leg Wound Laterality: Left, Anterior Cleanser: Soap and Water 1 x Per Week/7 Days Discharge Instructions: May shower and wash wound with dial antibacterial soap and water prior to dressing change. Peri-Wound Care: Sween Lotion (Moisturizing lotion) 1 x Per Week/7 Days Discharge Instructions: Apply moisturizing lotion as directed Prim Dressing: Sorbalgon AG Dressing, 4x4 (in/in) 1 x Per Week/7 Days ary Discharge Instructions: Apply to wound bed as instructed Secondary Dressing: ABD Pad, 5x9 1 x Per Week/7  Days Discharge Instructions: Apply over primary dressing as directed. Secondary Dressing: Woven Gauze Sponge, Non-Sterile 4x4 in 1 x Per Week/7 Days Discharge Instructions: Apply over primary dressing as directed. Com pression Wrap: Unnaboot w/Calamine, 4x10 (in/yd) 1 x Per  Week/7 Days Discharge Instructions: Apply Unnaboot as directed. 1. I am good recommend currently based on what I am seeing we continue with the silver alginate were also going to continue with the compression wrapping. Right now out of the 4-layer compression wraps and will then use the Coflex 2 layer to hopefully equal about the same. 2. I am also can recommend that we have the patient continue with the silver alginate dressing followed by the ABD pads to cover. Yearby, Kasandra [name] ([id number]) [phone].pdf Page 9 of 9 3. I am also going to suggest the patient should continue to monitor for any signs of infection or worsening obviously if anything changes he should contact the office and let me know. We will see patient back for reevaluation in 1 week here in the clinic. If anything worsens or changes patient will contact our office for additional recommendations. Electronic Signature(s) Signed: 06/11/2022 4:54:09 PM By: Worthy Keeler PA-C Entered By: Worthy Keeler on 06/11/2022 16:54:09 -------------------------------------------------------------------------------- SuperBill Details Patient Name: Date of Service: Dowse, DA [name] 06/11/2022 Medical Record Number: [id number] Patient Account Number: [id number] Date of Birth/Sex: Treating RN: 02/06/1946 (77 y.o. [name] Primary Care Provider: [name] Other Clinician: Referring Provider: Treating Provider/Extender: [name] Weeks in Treatment: 11 Diagnosis Coding ICD-10 Codes Code Description [phone] Non-pressure chronic ulcer of other part of left lower leg with other specified severity L97.818  Non-pressure chronic ulcer of other part of right lower leg with other specified severity I89.0 Lymphedema, not elsewhere classified Facility Procedures : CPT4: Code [id number] foot Description: 1 BILATERAL: Application of multi-layer venous compression system; leg (below knee), including ankle and . Modifier: Quantity: 1 Physician Procedures : CPT4 Code Description Modifier [id number] - WC PHYS LEVEL 3 - EST PT ICD-10 Diagnosis Description 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 I89.0 Lymphedema, not elsewhere classified Quantity: 1 Electronic Signature(s) Signed: 06/11/2022 4:54:28 PM By: Worthy Keeler PA-C Entered By: Worthy Keeler on 06/11/2022 16:54:27

## 2022-06-12 NOTE — Progress Notes (Signed)
[name], [name] ([id number]) [id number] Page 1 of 10 Visit Report for 06/11/2022 Arrival Information Details Patient Name: Date of Service: Alcoser, [name] 06/11/2022 12:45 PM Medical Record Number: [id number] Patient Account Number: [id number] Date of Birth/Sex: Treating RN: 01-24-1946 (77 y.o. [name] Primary Care Min Collymore: [name] Other Clinician: Referring Djibril Glogowski: Treating Benn Tarver/Extender: [name] in Treatment: 11 Visit Information History Since Last Visit Added or deleted any medications: Yes Patient Arrived: Cane Any new allergies or adverse reactions: No Arrival Time: 15:44 Had a fall or experienced change in No Accompanied By: self activities of daily living that may affect Transfer Assistance: None risk of falls: Patient Identification Verified: Yes Signs or symptoms of abuse/neglect since last visito No Secondary Verification Process Completed: Yes Hospitalized since last visit: No Patient Requires Transmission-Based Precautions: No Implantable device outside of the clinic excluding No Patient Has Alerts: Yes cellular tissue based products placed in the center Patient Alerts: Patient on Blood Thinner since last visit: Right ABI in clinic [place] Has Dressing in Place as Prescribed: Yes Has Compression in Place as Prescribed: Yes Pain Present Now: No Electronic Signature(s) Signed: 06/11/2022 6:16:47 PM By: [name], [name] Entered By: Deon Pilling on 06/11/2022 15:44:53 -------------------------------------------------------------------------------- Compression Therapy Details Patient Name: Date of Service: Mcilvain, [name] 06/11/2022 12:45 PM Medical Record Number: [id number] Patient Account Number: [id number] Date of Birth/Sex: Treating RN: 17-Aug-1945 (76 y.o. [name] Primary Care Joselyn Edling: [name] Other Clinician: Referring Hagen Tidd: Treating Farida Mcreynolds/Extender: [name] Weeks in Treatment: 11 Compression Therapy Performed for Wound Assessment: Wound #6 Right,Medial Lower Leg Performed By: Clinician Deon Pilling, RN Compression Type: Rolena Infante Post Procedure Diagnosis Same as Pre-procedure Electronic Signature(s) Signed: 06/11/2022 6:16:47 PM By: [name], [name] Entered By: Deon Pilling on 06/11/2022 16:10:02 Giusto, [name] ([id number]) [id number] Page 2 of 10 -------------------------------------------------------------------------------- Compression Therapy Details Patient Name: Date of Service: Reicher, [name] 06/11/2022 12:45 PM Medical Record Number: [id number] Patient Account Number: [id number] Date of Birth/Sex: Treating RN: Apr 07, 1946 (77 y.o. [name] Primary Care Guinevere Stephenson: [name] Other Clinician: Referring Atwell Mcdanel: Treating Wilhelmena [name]/Extender: [name] Weeks in Treatment: 11 Compression Therapy Performed for Wound Assessment: Wound #8 Left,Anterior Lower Leg Performed By: Clinician Deon Pilling, RN Compression Type: Rolena Infante Post Procedure Diagnosis Same as Pre-procedure Electronic Signature(s) Signed: 06/11/2022 6:16:47 PM By: [name], [name] Entered By: Deon Pilling on 06/11/2022 16:10:02 -------------------------------------------------------------------------------- Compression Therapy Details Patient Name: Date of Service: Creger, [name] 06/11/2022 12:45 PM Medical Record Number: [id number] Patient Account Number: [id number] Date of Birth/Sex: Treating RN: 1945-11-19 (76 y.o. [name] Primary Care Norvil Martensen: [name] Other Clinician: Referring Binnie Droessler: Treating Mary-Anne Polizzi/Extender: [name] Weeks in Treatment: 11 Compression Therapy Performed for Wound Assessment: Wound #7 Left,Medial Lower Leg Performed By: Clinician Deon Pilling, RN Compression Type: Rolena Infante Post Procedure  Diagnosis Same as Pre-procedure Electronic Signature(s) Signed: 06/11/2022 6:16:47 PM By: [name], [name] Entered By: Deon Pilling on 06/11/2022 16:10:02 -------------------------------------------------------------------------------- Encounter Discharge Information Details Patient Name: Date of Service: Geisler, [name] 06/11/2022 12:45 PM Medical Record Number: [id number] Patient Account Number: [id number] Date of Birth/Sex: Treating RN: [birth year] (77 y.o. [name] Primary Care Esmond Hinch: [name] Other Clinician: Referring Xander Jutras: Treating Jurline Folger/Extender: [name] in Treatment: 11 Encounter Discharge Information Items Discharge Condition: Stable Ambulatory Status: Cane Discharge Destination: Home Transportation: Private Auto  Accompanied By: self Schedule Follow-up Appointment: Yes Clinical Summary of Care: Pennino, [name] ([id number]) [id number] Page 3 of 10 Electronic Signature(s) Signed: 06/11/2022 6:16:47 PM By: [name], [name] Entered By: Deon Pilling on 06/11/2022 16:16:15 -------------------------------------------------------------------------------- Lower Extremity Assessment Details Patient Name: Date of Service: [name], [name] 06/11/2022 12:45 PM Medical Record Number: [id number] Patient Account Number: [id number] Date of Birth/Sex: Treating RN: Jul 01, 1945 (77 y.o. [name] Primary Care Quincey Quesinberry: [name] Other Clinician: Referring Giulliana Mcroberts: Treating Junko Ohagan/Extender: [name], [name] Weeks in Treatment: 11 Edema Assessment Assessed: [Left: Yes] [Right: No] Edema: [Left: Yes] [Right: Yes] Calf Left: Right: Point of Measurement: 40 cm From Medial Instep 61 cm 61 cm Ankle Left: Right: Point of Measurement: 13 cm From Medial Instep 51 cm 51 cm Electronic Signature(s) Signed: 06/11/2022 6:16:47 PM By: [name], [name] Entered By: Deon Pilling on 06/11/2022 15:54:03 -------------------------------------------------------------------------------- Multi-Disciplinary Care Plan Details Patient Name: Date of Service: Guyton, [name] 06/11/2022 12:45 PM Medical Record Number: [id number] Patient Account Number: [id number] Date of Birth/Sex: Treating RN: 01-Mar-1946 (76 y.o. [name] Primary Care Ciearra Rufo: [name] Other Clinician: Referring Dominick Zertuche: Treating Margery Szostak/Extender: [name] Weeks in Treatment: 11 Active Inactive Wound/Skin Impairment Nursing Diagnoses: Impaired tissue integrity Knowledge deficit related to ulceration/compromised skin integrity Goals: Patient will have a decrease in wound volume by X% from date: (specify in notes) Date Initiated: 03/26/2022 Target Resolution Date: 06/20/2022 Goal Status: Active Patient/caregiver will verbalize understanding of skin care regimen Date Initiated: 03/26/2022 Target Resolution Date: 06/20/2022 Goal Status: Active Ulcer/skin breakdown will have a volume reduction of 30% by week 4 Cardosa, Filippo ([id number]) [id number] Page 4 of 10 Date Initiated: 03/26/2022 Date Inactivated: 05/21/2022 Target Resolution Date: 05/17/2022 Unmet Reason: see wound Goal Status: Unmet measurement. Ulcer/skin breakdown will have a volume reduction of 50% by week 8 Date Initiated: 03/26/2022 Date Inactivated: 05/21/2022 Target Resolution Date: 05/17/2022 Unmet Reason: see wound Goal Status: Unmet measurement. Interventions: Assess patient/caregiver ability to obtain necessary supplies Assess patient/caregiver ability to perform ulcer/skin care regimen upon admission and as needed Assess ulceration(s) every visit Notes: Patient stated today, "I will take my fluid pill or pump not do both." Muntaha Vermette made aware. Electronic Signature(s) Signed: 06/11/2022 6:16:47 PM By: [name], [name] Entered By: Deon Pilling on 06/11/2022  16:15:17 -------------------------------------------------------------------------------- Pain Assessment Details Patient Name: Date of Service: Decelle, [name] 06/11/2022 12:45 PM Medical Record Number: [id number] Patient Account Number: [id number] Date of Birth/Sex: Treating RN: October 31, 1945 (77 y.o. [name] Primary Care Marc Leichter: [name] Other Clinician: Referring Eliya Bubar: Treating Nonna Renninger/Extender: [name] Weeks in Treatment: 11 Active Problems Location of Pain Severity and Description of Pain Patient Has Paino No Site Locations Pain Management and Medication Current Pain Management: Electronic Signature(s) Signed: 06/11/2022 6:16:47 PM By: [name], [name] Entered By: Deon Pilling on 06/11/2022 15:45:03 Kemmerer, [name] ([id number]) [id number] Page 5 of 10 -------------------------------------------------------------------------------- Patient/Caregiver Education Details Patient Name: Date of Service: [place], [place] 1/24/2024andnbsp12:45 PM Medical Record Number: [id number] Patient Account Number: [id number] Date of Birth/Gender: Treating RN: 05/15/1946 (77 y.o. [name] Primary Care Physician: [name] Other Clinician: Referring Physician: Treating Physician/Extender: [name] in Treatment: 11 Education Assessment Education Provided To: Patient Education Topics Provided Venous: Handouts: Controlling Swelling with Multilayered Compression Wraps Methods: Explain/Verbal Responses: Reinforcements needed Electronic Signature(s) Signed: 06/11/2022 6:16:47 PM By: [name], [name] Entered By: Deon Pilling on  06/11/2022 16:15:35 -------------------------------------------------------------------------------- Wound Assessment Details Patient Name: Date of Service: Gibbins, [name] 06/11/2022 12:45 PM Medical Record Number: [id number] Patient Account  Number: [id number] Date of Birth/Sex: Treating RN: 1945-10-18 (77 y.o. [name] Primary Care Kyzer Blowe: [name] Other Clinician: Referring Chibuike Fleek: Treating Briel Gallicchio/Extender: [name] Weeks in Treatment: 11 Wound Status Wound Number: 6 Primary Lymphedema Etiology: Wound Location: Right, Medial Lower Leg Wound Open Wounding Event: Gradually Appeared Status: Date Acquired: 03/05/2022 Comorbid Anemia, Lymphedema, Deep Vein Thrombosis, Hypertension, Weeks Of Treatment: 11 History: Received Radiation Clustered Wound: Yes Photos Wound Measurements Length: (cm) 6. Streng, Antonyo ([id number]) Width: (cm) Depth: (cm) Clustered Quantity: Area: (cm) Volume: (cm) 5 % Reduction in Area: 82.1% [phone].pdf Page 6 of 10 5 % Reduction in Volume: 64.3% 0.2 Epithelialization: Large (67-100%) 1 Tunneling: No 25.525 Undermining: No 5.105 Wound Description Classification: Full Thickness Without Exposed Sup Wound Margin: Distinct, outline attached Exudate Amount: Medium Exudate Type: Serosanguineous Exudate Color: red, brown port Structures Foul Odor After Cleansing: No Slough/Fibrino Yes Wound Bed Granulation Amount: None Present (0%) Exposed Structure Necrotic Amount: Large (67-100%) Fascia Exposed: No Necrotic Quality: Adherent Slough Fat Layer (Subcutaneous Tissue) Exposed: Yes Tendon Exposed: No Muscle Exposed: No Joint Exposed: No Bone Exposed: No Periwound Skin Texture Texture Color No Abnormalities Noted: No No Abnormalities Noted: No Callus: No Atrophie Blanche: No Crepitus: No Cyanosis: No Excoriation: No Ecchymosis: No Induration: No Erythema: No Rash: No Hemosiderin Staining: No Scarring: No Mottled: No Pallor: No Moisture Rubor: No No Abnormalities Noted: No Dry / Scaly: No Maceration: Yes Treatment Notes Wound #6 (Lower Leg) Wound Laterality: Right, Medial Cleanser Soap and  Water Discharge Instruction: May shower and wash wound with dial antibacterial soap and water prior to dressing change. Peri-Wound Care Sween Lotion (Moisturizing lotion) Discharge Instruction: Apply moisturizing lotion as directed Topical Primary Dressing Sorbalgon AG Dressing, 4x4 (in/in) Discharge Instruction: Apply to wound bed as instructed Secondary Dressing ABD Pad, 5x9 Discharge Instruction: Apply over primary dressing as directed. Woven Gauze Sponge, Non-Sterile 4x4 in Discharge Instruction: Apply over primary dressing as directed. Secured With Compression Wrap Unnaboot w/Calamine, 4x10 (in/yd) Discharge Instruction: Apply Unnaboot as directed. Compression Stockings Add-Ons Electronic Signature(s) Signed: 06/11/2022 6:16:47 PM By: [name], [name] Entered By: Deon Pilling on 06/11/2022 16:05:11 Fetterly, [name] ([id number]) [id number] Page 7 of 10 -------------------------------------------------------------------------------- Wound Assessment Details Patient Name: Date of Service: Eppard, [name] 06/11/2022 12:45 PM Medical Record Number: [id number] Patient Account Number: [id number] Date of Birth/Sex: Treating RN: 1945-11-22 (77 y.o. [name] Primary Care [place] Kuang: [name] Other Clinician: Referring Azarius Lambson: Treating Jaynell Castagnola/Extender: [name] Weeks in Treatment: 11 Wound Status Wound Number: 7 Primary Lymphedema Etiology: Wound Location: Left, Medial Lower Leg Wound Open Wounding Event: Gradually Appeared Status: Date Acquired: 03/05/2022 Comorbid Anemia, Lymphedema, Deep Vein Thrombosis, Hypertension, Weeks Of Treatment: 11 History: Received Radiation Clustered Wound: No Photos Wound Measurements Length: (cm) 5 Width: (cm) 6 Depth: (cm) 0.1 Area: (cm) 23.562 Volume: (cm) 2.356 % Reduction in Area: 50% % Reduction in Volume: 50% Epithelialization: Small (1-33%) Tunneling:  No Undermining: No Wound Description Classification: Full Thickness Without Exposed Suppor Wound Margin: Distinct, outline attached Exudate Amount: Medium Exudate Type: Serosanguineous Exudate Color: red, brown t Structures Foul Odor After Cleansing: No Slough/Fibrino Yes Wound Bed Granulation Amount: Medium (34-66%) Exposed Structure Granulation Quality: Red, Pink Fascia Exposed: No Necrotic Amount: Medium (34-66%) Fat Layer (Subcutaneous Tissue) Exposed: Yes Necrotic Quality: Adherent Slough Tendon Exposed: No Muscle  Exposed: No Joint Exposed: No Bone Exposed: No Periwound Skin Texture Texture Color No Abnormalities Noted: No No Abnormalities Noted: No Callus: No Atrophie Blanche: No Crepitus: No Cyanosis: No Excoriation: No Ecchymosis: No Induration: No Erythema: No Rash: No Hemosiderin Staining: No Scarring: No Mottled: No Pallor: No Moisture Rubor: No No Abnormalities Noted: No Dry / Scaly: No Billey, Tadeo ([id number]) [id number] Page 8 of 10 Maceration: Yes Treatment Notes Wound #7 (Lower Leg) Wound Laterality: Left, Medial Cleanser Soap and Water Discharge Instruction: May shower and wash wound with dial antibacterial soap and water prior to dressing change. Peri-Wound Care Sween Lotion (Moisturizing lotion) Discharge Instruction: Apply moisturizing lotion as directed Topical Primary Dressing Sorbalgon AG Dressing, 4x4 (in/in) Discharge Instruction: Apply to wound bed as instructed Secondary Dressing ABD Pad, 5x9 Discharge Instruction: Apply over primary dressing as directed. Woven Gauze Sponge, Non-Sterile 4x4 in Discharge Instruction: Apply over primary dressing as directed. Secured With Compression Wrap Unnaboot w/Calamine, 4x10 (in/yd) Discharge Instruction: Apply Unnaboot as directed. Compression Stockings Add-Ons Electronic Signature(s) Signed: 06/11/2022 6:16:47 PM By: [name], [name] Entered By:  Deon Pilling on 06/11/2022 16:03:56 -------------------------------------------------------------------------------- Wound Assessment Details Patient Name: Date of Service: Yau, [name] 06/11/2022 12:45 PM Medical Record Number: [id number] Patient Account Number: [id number] Date of Birth/Sex: Treating RN: 09-16-1945 (77 y.o. [name], [name] Primary Care [name] Bensinger: [name] Other Clinician: Referring Whitt Auletta: Treating Jailyn Leeson/Extender: [name] Weeks in Treatment: 11 Wound Status Wound Number: 8 Primary Lymphedema Etiology: Wound Location: Left, Anterior Lower Leg Wound Open Wounding Event: Gradually Appeared Status: Date Acquired: 06/11/2022 Comorbid Anemia, Lymphedema, Deep Vein Thrombosis, Hypertension, Weeks Of Treatment: 0 History: Received Radiation Clustered Wound: No Wound Measurements Length: (cm) 3 Width: (cm) 5 Depth: (cm) 0.1 Area: (cm) 11.781 Volume: (cm) 1.178 % Reduction in Area: % Reduction in Volume: Epithelialization: None Tunneling: No Undermining: No Wound Description Classification: Full Thickness Without Exposed Support Structures Bouknight, [place] ([id number]) Wound Margin: Distinct, outline attached Exudate Amount: Medium Exudate Type: Serosanguineous Exudate Color: red, brown Foul Odor After Cleansing: No [phone].pdf Page 9 of 10 Slough/Fibrino Yes Wound Bed Granulation Amount: Medium (34-66%) Exposed Structure Granulation Quality: Red, Pink Fascia Exposed: No Necrotic Amount: Medium (34-66%) Fat Layer (Subcutaneous Tissue) Exposed: Yes Necrotic Quality: Adherent Slough Tendon Exposed: No Muscle Exposed: No Joint Exposed: No Bone Exposed: No Periwound Skin Texture Texture Color No Abnormalities Noted: No No Abnormalities Noted: No Callus: No Atrophie Blanche: No Crepitus: No Cyanosis: No Excoriation: No Ecchymosis: No Induration: No Erythema: No Rash: No Hemosiderin  Staining: No Scarring: No Mottled: No Pallor: No Moisture Rubor: No No Abnormalities Noted: No Dry / Scaly: No Maceration: Yes Treatment Notes Wound #8 (Lower Leg) Wound Laterality: Left, Anterior Cleanser Soap and Water Discharge Instruction: May shower and wash wound with dial antibacterial soap and water prior to dressing change. Peri-Wound Care Sween Lotion (Moisturizing lotion) Discharge Instruction: Apply moisturizing lotion as directed Topical Primary Dressing Sorbalgon AG Dressing, 4x4 (in/in) Discharge Instruction: Apply to wound bed as instructed Secondary Dressing ABD Pad, 5x9 Discharge Instruction: Apply over primary dressing as directed. Woven Gauze Sponge, Non-Sterile 4x4 in Discharge Instruction: Apply over primary dressing as directed. Secured With Compression Wrap Unnaboot w/Calamine, 4x10 (in/yd) Discharge Instruction: Apply Unnaboot as directed. Compression Stockings Add-Ons Electronic Signature(s) Signed: 06/11/2022 6:16:47 PM By: [name], [name] Entered By: Deon Pilling on 06/11/2022 16:05:02 Mash, [name] ([id number]) [id number] Page 10 of 10 -------------------------------------------------------------------------------- Vitals Details Patient Name: Date of Service: Ruddy, [name] [facility] 06/11/2022 12:45 PM  Medical Record Number: [id number] Patient Account Number: [id number] Date of Birth/Sex: Treating RN: 02/27/1946 (77 y.o. [name] Primary Care Kenlee Maler: [name] Other Clinician: Referring Shynia Daleo: Treating Danielle Lento/Extender: [name] Weeks in Treatment: 11 Vital Signs Time Taken: 16:00 Temperature (F): 98 Height (in): 74 Pulse (bpm): 84 Weight (lbs): 250 Respiratory Rate (breaths/min): 20 Body Mass Index (BMI): 32.1 Blood Pressure (mmHg): 190/92 Reference Range: 80 - 120 mg / dl Electronic Signature(s) Signed: 06/11/2022 6:16:47 PM By: [name], [name] Entered  By: Deon Pilling on 06/11/2022 16:01:23

## 2022-06-18 ENCOUNTER — Encounter (HOSPITAL_BASED_OUTPATIENT_CLINIC_OR_DEPARTMENT_OTHER): Payer: Medicare Other | Admitting: Physician Assistant

## 2022-06-18 DIAGNOSIS — I87333 Chronic venous hypertension (idiopathic) with ulcer and inflammation of bilateral lower extremity: Secondary | ICD-10-CM | POA: Diagnosis not present

## 2022-06-18 NOTE — Progress Notes (Addendum)
Parma, [name] ([id number]) [id number] Page 1 of 9 Visit Report for 06/18/2022 Chief Complaint Document Details Patient Name: Date of Service: [name] 06/18/2022 12:45 PM Medical Record Number: [id number] Patient Account Number: [id number] Date of Birth/Sex: Treating RN: 04/28/1946 (77 y.o. M) Primary Care Provider: [name] Other Clinician: Referring Provider: Treating Provider/Extender: [name] Weeks in Treatment: 12 Information Obtained from: Patient Chief Complaint 02/23/2020; patient is here for wounds on his bilateral lower legs in the setting of severe lymphedema 03/26/2022; patient is here for wounds on his bilateral lower legs medial aspect Electronic Signature(s) Signed: 06/18/2022 12:56:13 PM By: Worthy Keeler PA-C Entered By: Worthy Keeler on 06/18/2022 12:56:12 -------------------------------------------------------------------------------- HPI Details Patient Name: Date of Service: Riggio, [name] 06/18/2022 12:45 PM Medical Record Number: [id number] Patient Account Number: [id number] Date of Birth/Sex: Treating RN: [birth year] (77 y.o. M) Primary Care Provider: [name] Other Clinician: Referring Provider: Treating Provider/Extender: [name] Weeks in Treatment: 12 History of Present Illness HPI Description: ADMISSION 02/23/2020 Patient is a 77 year old man who lives in [place] who arrives accompanied by his wife. He has a history of chronic lymphedema and venous insufficiency in his bilateral lower legs which may have something to do that with having a history of DVT as well as being treated for prostate cancer. In any case he recently got compression pumps at home but compliance has been an issue here. He has compression stockings however they are probably not sufficient enough to control swelling. They tell us that things deteriorated for him in late August he was  admitted to [facility] for 7 days. This was with cellulitis I think of his bilateral lower legs. Discharge he was noted to have wounds on his bilateral lower legs. He was discharged on Bactrim. They tried to get him home health through [facility] of course they declined him. His wife is been wrapping these applying some form of silver foam dressing. He has a history of wounds before although nothing that would not heal with basic home topical dressings. He has 2 areas on the left medial, left anterior and left lateral and a smaller area on the right medial. All of these have considerable depth. Past medical history includes iron deficiency anemia, lymphedema followed by the rehab center at [facility] with lymphedema wraps I believe, DVT on chronic anticoagulation, prostate cancer, chronic venous insufficiency, hypertension. As mentioned he has compression pumps but does not use them. ABIs in our clinic were noncompressible bilaterally 10/14; patient with severe bilateral lymphedema right greater than left. He came in with bilateral lower extremity wounds left greater than right. Even though the right side has more of the edema most of the wounds here almost closed on the right medial. He has 3 remaining wounds on the left We have been using silver alginate under 4-layer compression I have been trying to get him to be compliant with his external compression pumps 10/21; patient with 3 small wounds on the left leg and 1 on the right medial in the setting of severe lymphedema and chronic venous insufficiency. We have been using silver alginate under 4-layer compression he is using his external compression pumps twice a day 11/4; ARTERIAL STUDIES on the right show an ABI of 1.02 TBI of 0.858 with biphasic waveforms on the left 0.98 with a TBI of 0.55 and biphasic waveforms. Does not look like he has significant arterial disease. We are treating  him for lymphedema he has compression  pumps. He has punched-out areas on the left anterior left lateral and right medial lower extremities 11/11; after we obtained his arterial studies I put him in 4 layer compression. He is using his compression pumps probably once a day although I have asked Brower, [name] ([id number]) [phone].pdf Page 2 of 9 him to do twice. Primary dressing to the wound is silver collagen he has severe lymphedema likely secondary to chronic venous insufficiency. Wounds on the left lateral, left medial and left anterior and a small area on the right medial 12/2; the area on the right anterior lower leg has healed. We initially thought that the area medially had healed as well however when her discharge nurse came in she detected fluid in the wound simply opened up. This is actually worse than I remember this pain. The area on the left lateral potentially slightly smaller He is also complaining about pain in his left hand he says that this is actually been getting some better he has been using topical creams on this. She asked that I look at this 12/9 after last weeks issues we have 2 wounds one on the right medial lower leg and 1 on the left lateral. Both of these are in the same condition. I think because of thickened skin secondary to chronic lymphedema these wounds actually have depth of almost 0.8 cm. 12/16; the patient has 2 small but deep wounds one on the right medial and one on the left lateral. The right medial is actually the worst of these. He arrives in clinic today with absolutely terrible edema in the right leg apparently his 4-layer wrap fell down to just above his ankle he did not think about this he is apparently been continuing to use his compression pump twice a day. The left leg looks a lot better. 05/09/2020 upon evaluation today patient appears to be doing decently well in regard to his wounds. Everything is measuring smaller the right leg still has a little bit deeper  wound in the left seems to be almost completely healed in my opinion I am very pleased in general with how things are progressing. He has a 4- layer compression wrap we have been using endoform today we will probably have to use collagen just based on the fact that we do not have endoform it is on order. 1/6; the patient's wound on the left lateral lower leg has healed. Still has 1 on the right medial. 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 Using compression pumps at home twice a day. 1/13; left lateral lower leg is still healed. He has a deep punched out rectangular shaped wound on the right medial calf. Looking down at this it appears that he is attempting to epithelialize around the edges of the wound and on the base as well. His edema is reasonably well controlled we have been using collagen with absolutely no effect 1/20; left lateral lower leg remains closed he has extremitease stockings. The area on the right medial calf I aggressively debrided last week measures larger but the surface looks better. We have been using Hydrofera Blue. We ran Oasis through his insurance but we have not seen the results of this 1/27; left lower leg wound with chronic venous insufficiency and secondary lymphedema. I did aggressive debridement on this last week the wound seems to have come in healthy looking surface using Hydrofera Blue. He was denied for Oasis 2/3; small divot in the right medial  lower leg. Under illumination the walls of this divot are epithelialized however the base has slough which I removed with a curette we have been using Hydrofera Blue 2/10 small divot on the right medial lower leg pinpoint illumination at the base of this cone-shaped wound. We have been using Hydrofera Blue but I will switch to calcium alginate this week 2/17; the small divot on the right medial lower leg is fully epithelialized. There is no visible open area under illumination. He has his own  stocking for the right leg similar to the one he has been wearing on the left. 03/26/2022; READMISSION This is a now 77 year old man that we had in the clinic from 02/23/2020 through 07/05/2020. At that point he had bilateral lower extremity wounds left greater than right in the setting of severe lymphedema. He had already obtained compression pumps ordered for him I think from the wound care clinic in [facility] so I do not really have record of what he has been using. He claims to be using them once a day but there is a problem with the sleeve on the left leg. About 2 weeks ago he was hospitalized from 03/11/2022 through [id number] with diastolic congestive heart failure. 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 He was diuresed. Developed some prerenal azotemia and he has not been taking any diuretics currently. He has not been putting stockings on his legs since he got out of hospital and still has his legs dependent for long periods. Past medical history history of prostate cancer treated with prostatectomy and radiation this was apparently about 8 years ago, history of DVT on chronic Coumadin, history of lymphedema was managed for a while at the clinic in [place]. History of inguinal hernia repair in September 22, hypertension, stage IIIb chronic renal failure ABIs today were noncompressible on the right 1.12 on the left 04-02-2022 upon evaluation today patient appears to be doing well currently in regard to his legs I do feel like both areas that are draining are actually much drier than they were in the picture last week although the left is drier than the right. He is tolerating the 4-layer compression wraps at this point he did contact the pump company and they are actually working on getting him a new compression sleeve for one of his legs which have previously popped and was not functioning properly. 04-23-2022 upon evaluation today  patient appears to be doing well currently in regard to his wounds on the legs. I am actually very pleased with where things stand and I do feel like that we are headed in the right direction. Fortunately there is no sign of active infection locally or systemically at this time. 05-07-2022 upon evaluation today patient appears to be doing well currently in regard to his wounds in fact things are showing signs of improvement which is good news I do not see too much that actually appears to be open and I am very pleased in that regard. No fevers, chills, nausea, vomiting, or diarrhea. 05-21-2022 upon evaluation today patient appears to be doing somewhat poorly in regard to drainage of his lower extremities bilaterally. The right is greater than left as far as the weeping area. Nonetheless it seems to be getting worse not better. He actually has pitting edema which is at least 2+ to the thighs and I am concerned about the fact that he is may be fluid overloaded in general and that is the  reason why we cannot get this under control. I know he is not using his pumps all the time because he actually told the nurse that he was either going to pump or he was going to use his fluid pills but not do both. For that reason I do think that he needs to be really doing both in order to get the fluid out as effectively as possible obviously with the 4-layer compression wraps were doing as much as we can from a compression standpoint but it is really not enough. He tells me that he elevates his leg is much as he can in between pumping and other activity throughout the day. 05-28-2022 upon evaluation today patient appears to be doing better in regard to his wounds although the measurements may be a little bit larger this is a very difficult wound to heal it is very indistinct in a lot of areas. Nonetheless there is can be some need for sharp debridement in regard to both medial and lateral legs. Fortunately I see no signs  of active infection locally nor systemically at this time. No fevers, chills, nausea, vomiting, or diarrhea. 06-04-2022 upon evaluation today patient appears to be doing poorly in general in regard to the wounds on his legs. He still continues to have a tremendous amount of fluid not just in the lower portion of his leg but to be honest his thigh where he has 2-3+ pitting edema in the thigh as well. Unfortunately I do not know that we will be able to get this healed effectively and keep it healed on the lower extremities unless he gets the overall fluid situation taking and under control. Fortunately I do not see any signs of infection locally nor systemically which is great news. He just seems to be very fluid overloaded. 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 He has been tolerating the dressing changes with the compression wraps without complication. Fortunately I do not see any evidence of infection at this time which is great news. No fevers, chills, nausea, vomiting, or diarrhea. 06-18-2022 upon evaluation today patient appears to be doing well currently in regard to his wounds as far as not looking like they are terribly infected but nonetheless I am concerned about a subacute infection secondary to the fact that he continues to have spreading despite the compression therapy. We actually did do an Unna boot on him last week this is actually the first wrap that actually stayed up everything else has been sliding down quite significantly. Fortunately there does not appear to be any signs of infection systemically at this time. With that being said I do believe that locally there seems to be an issue going on here and again I Georgina Peer do a PCR culture to see what that shows also think that I am going to put him on a broad-spectrum antibiotic, doxycycline to see how that will help as well.  He does tell me that coming into the clinic today that he was feeling short of breath like "he was about to have a heart attack" because he was having such a hard time breathing. He says that he told this to Dr. Debbora Dus his cardiologist as well when he was evaluated in the [place], [facility] ([id number]) [phone].pdf Page 3 of 9 past 1 to 2 weeks. Electronic Signature(s) Signed: 06/18/2022 2:02:57 PM By: Worthy Keeler PA-C Entered By: Worthy Keeler on 06/18/2022 14:02:56 -------------------------------------------------------------------------------- Physical  Exam Details Patient Name: Date of Service: Hausler, [name] 06/18/2022 12:45 PM Medical Record Number: [id number] Patient Account Number: [id number] Date of Birth/Sex: Treating RN: 14-Jan-1946 (77 y.o. M) Primary Care Provider: [name] Other Clinician: Referring Provider: Treating Provider/Extender: [name] Weeks in Treatment: 108 Constitutional Well-nourished and well-hydrated in no acute distress. Respiratory normal breathing without difficulty. Psychiatric this patient is able to make decisions and demonstrates good insight into disease process. Alert and Oriented x 3. pleasant and cooperative. Notes Upon inspection patient's wound bed actually showed signs of not really being an open wound terribly per se it is more skin breakdown in general but unfortunately both the left and the right are getting larger not smaller. We tried a 4-layer wrap does seem to be getting worse because it was constantly sliding down therefore we have gone to the [employer] wraps which seem to stay up better but again no matter what we do it does not seem to be improving like we want to see it do. For that reason I do want to see about getting a antibiotic started for him to see if this can make a difference. Electronic Signature(s) Signed: 06/18/2022 2:03:40 PM By: Worthy Keeler PA-C Entered By:  Worthy Keeler on 06/18/2022 14:03:40 -------------------------------------------------------------------------------- Physician Orders Details Patient Name: Date of Service: Porchia, [name] 06/18/2022 12:45 PM Medical Record Number: [id number] Patient Account Number: [id number] Date of Birth/Sex: Treating RN: September 16, 1945 (77 y.o. [name], [name] Primary Care Provider: [name] Other Clinician: Referring Provider: Treating Provider/Extender: [name] in Treatment: 68 Verbal / Phone Orders: No Diagnosis Coding ICD-10 Coding Code Description [phone] Chronic venous hypertension (idiopathic) with ulcer and inflammation of bilateral lower extremity I89.0 Lymphedema, not elsewhere classified 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 other part of right lower leg with other specified severity Follow-up Appointments ppointment in 1 week. - w/ Jeri Cos (Dr. Dellia Nims covering) Wednesdays 130pm 06/25/2022 Return A ppointment in 2 weeks. - w/ Jeri Cos Wednesdays 07/02/2022 130pm Return A Other: - Will call you cardiologist to make aware of your shortness of breath with exertion, fluid overload in both legs, and elevated blood pressure. Moss, [name] ([id number]) [id number] Page 4 of 9 ****Go pick up oral antibiotic from your pharmacy.**** ***Will call you with culture if positive. If positive will order topical compounding antibiotics from [facility]. They will call you to set up shipment and payment of topical antibiotics.**** Bathing/ Shower/ Hygiene May shower with protection but do not get wound dressing(s) wet. Protect dressing(s) with water repellant cover (for example, large plastic bag) or a cast cover and may then take shower. Edema Control - Lymphedema / SCD / Other Lymphedema Pumps. Use Lymphedema pumps on leg(s) 2-3  times a day for 45-60 minutes. If wearing any wraps or hose, do not remove them. Continue exercising as instructed. Elevate legs to the level of the heart or above for 30 minutes daily and/or when sitting for 3-4 times a day throughout the day. Avoid standing for long periods of time. Exercise regularly Compression stocking or Garment 20-30 mm/Hg pressure to: - will place an order assure medi compression stockings from [place]. Assure medical compression stockings open toe beige thigh with topband 20-30 size XL for both legs. Wound Treatment Wound #6 - Lower Leg Wound Laterality: Right, Medial Cleanser: Soap and Water 1 x Per Week/7 Days  Discharge Instructions: May shower and wash wound with dial antibacterial soap and water prior to dressing change. Peri-Wound Care: Sween Lotion (Moisturizing lotion) 1 x Per Week/7 Days Discharge Instructions: Apply moisturizing lotion as directed Prim Dressing: Sorbalgon AG Dressing, 4x4 (in/in) 1 x Per Week/7 Days ary Discharge Instructions: Apply to wound bed as instructed Secondary Dressing: ABD Pad, 5x9 1 x Per Week/7 Days Discharge Instructions: Apply over primary dressing as directed. Secondary Dressing: Woven Gauze Sponge, Non-Sterile 4x4 in 1 x Per Week/7 Days Discharge Instructions: Apply over primary dressing as directed. Compression Wrap: Unnaboot w/Calamine, 4x10 (in/yd) 1 x Per Week/7 Days Discharge Instructions: Apply Unnaboot as directed. Wound #7 - Lower Leg Wound Laterality: Left, Circumferential Cleanser: Soap and Water 1 x Per Week/7 Days Discharge Instructions: May shower and wash wound with dial antibacterial soap and water prior to dressing change. Peri-Wound Care: Sween Lotion (Moisturizing lotion) 1 x Per Week/7 Days Discharge Instructions: Apply moisturizing lotion as directed Prim Dressing: Sorbalgon AG Dressing, 4x4 (in/in) 1 x Per Week/7 Days ary Discharge Instructions: Apply to wound bed as instructed Secondary Dressing: ABD  Pad, 5x9 1 x Per Week/7 Days Discharge Instructions: Apply over primary dressing as directed. Secondary Dressing: Woven Gauze Sponge, Non-Sterile 4x4 in 1 x Per Week/7 Days Discharge Instructions: Apply over primary dressing as directed. Compression Wrap: Unnaboot w/Calamine, 4x10 (in/yd) 1 x Per Week/7 Days Discharge Instructions: Apply Unnaboot as directed. Patient Medications llergies: cephalexin, mometasone furoate A Notifications Medication Indication Start End 06/18/2022 doxycycline hyclate DOSE 1 - oral 100 mg capsule - 1 capsule oral twice a day x 14 days. do not take iron while on this medication Electronic Signature(s) Signed: 06/18/2022 2:04:56 PM By: Worthy Keeler PA-C Entered By: Worthy Keeler on 06/18/2022 14:04:56 Kommer, [name] ([id number]) [id number] Page 5 of 9 -------------------------------------------------------------------------------- Problem List Details Patient Name: Date of Service: Stokes, [name] 06/18/2022 12:45 PM Medical Record Number: [id number] Patient Account Number: [id number] Date of Birth/Sex: Treating RN: 1946/03/08 (77 y.o. M) Primary Care Provider: [name] Other Clinician: Referring Provider: Treating Provider/Extender: [name] in Treatment: 12 Active Problems ICD-10 Encounter Code Description Active Date MDM Diagnosis I87.333 Chronic venous hypertension (idiopathic) with ulcer and inflammation of 06/11/2022 No Yes bilateral lower extremity I89.0 Lymphedema, not elsewhere classified 03/26/2022 No Yes L97.828 Non-pressure chronic ulcer of other part of left lower leg with other specified 03/26/2022 No Yes severity 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 other part of right lower leg with other specified 03/26/2022 No Yes severity Inactive Problems Resolved Problems Electronic Signature(s) Signed: 06/18/2022 12:55:57 PM By: Worthy Keeler PA-C Entered By: Worthy Keeler  on 06/18/2022 12:55:57 -------------------------------------------------------------------------------- Progress Note Details Patient Name: Date of Service: Abernathy, [name] 06/18/2022 12:45 PM Medical Record Number: [id number] Patient Account Number: [id number] Date of Birth/Sex: Treating RN: 1945-12-15 (77 y.o. M) Primary Care Provider: [name] Other Clinician: Referring Provider: Treating Provider/Extender: [name] Weeks in Treatment: 12 Subjective Chief Complaint Information obtained from Patient 02/23/2020; patient is here for wounds on his bilateral lower legs in the setting of severe lymphedema 03/26/2022; patient is here for wounds on his bilateral lower legs medial aspect History of Present Illness (HPI) ADMISSION Round, Umberto ([id number]) [id number] Page 6 of 9 02/23/2020 Patient is a 77 year old man who lives in [place] who arrives accompanied by his wife. He has a history of chronic lymphedema and venous insufficiency in his bilateral lower legs which may have something to do  that with having a history of DVT as well as being treated for prostate cancer. In any case he recently got compression pumps at home but compliance has been an issue here. He has compression stockings however they are probably not sufficient enough to control swelling. They tell us that things deteriorated for him in late August he was admitted to [facility] for 7 days. This was with cellulitis I think of his bilateral lower legs. Discharge he was noted to have wounds on his bilateral lower legs. He was discharged on Bactrim. They tried to get him home health through [facility] of course they declined him. His wife is been wrapping these applying some form of silver foam dressing. He has a history of wounds before although nothing that would not heal with basic home topical dressings. He has 2 areas on the left medial,  left anterior and left lateral and a smaller area on the right medial. All of these have considerable depth. Past medical history includes iron deficiency anemia, lymphedema followed by the rehab center at [facility] with lymphedema wraps I believe, DVT on chronic anticoagulation, prostate cancer, chronic venous insufficiency, hypertension. As mentioned he has compression pumps but does not use them. ABIs in our clinic were noncompressible bilaterally 10/14; patient with severe bilateral lymphedema right greater than left. He came in with bilateral lower extremity wounds left greater than right. Even though the right side has more of the edema most of the wounds here almost closed on the right medial. He has 3 remaining wounds on the left We have been using silver alginate under 4-layer compression I have been trying to get him to be compliant with his external compression pumps 10/21; patient with 3 small wounds on the left leg and 1 on the right medial in the setting of severe lymphedema and chronic venous insufficiency. We have been using silver alginate under 4-layer compression he is using his external compression pumps twice a day 11/4; ARTERIAL STUDIES on the right show an ABI of 1.02 TBI of 0.858 with biphasic waveforms on the left 0.98 with a TBI of 0.55 and biphasic waveforms. Does not look like he has significant arterial disease. We are treating him for lymphedema he has compression pumps. He has punched-out areas on the left anterior left lateral and right medial lower extremities 11/11; after we obtained his arterial studies I put him in 4 layer compression. He is using his compression pumps probably once a day although I have asked him to do twice. Primary dressing to the wound is silver collagen he has severe lymphedema likely secondary to chronic venous insufficiency. Wounds on the left lateral, left medial and left anterior and a small area on the right medial 12/2; the area on the  right anterior lower leg has healed. We initially thought that the area medially had healed as well however when her discharge nurse came in she detected fluid in the wound simply opened up. This is actually worse than I remember this pain. The area on the left lateral potentially slightly smaller He is also complaining about pain in his left hand he says that this is actually been getting some better he has been using topical creams on this. She asked that I look at this 12/9 after last weeks issues we have 2 wounds one on the right medial lower leg and 1 on the left lateral. Both of these are in the same condition. I think because of thickened skin  secondary to chronic lymphedema these wounds actually have depth of almost 0.8 cm. 12/16; the patient has 2 small but deep wounds one on the right medial and one on the left lateral. The right medial is actually the worst of these. He arrives in clinic today with absolutely terrible edema in the right leg apparently his 4-layer wrap fell down to just above his ankle he did not think about this he is apparently been continuing to use his compression pump twice a day. The left leg looks a lot better. 05/09/2020 upon evaluation today patient appears to be doing decently well in regard to his wounds. Everything is measuring smaller the right leg still has a little bit deeper wound in the left seems to be almost completely healed in my opinion I am very pleased in general with how things are progressing. He has a 4- layer compression wrap we have been using endoform today we will probably have to use collagen just based on the fact that we do not have endoform it is on order. 1/6; the patient's wound on the left lateral lower leg has healed. Still has 1 on the right medial. 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 Using compression pumps at home twice a day. 1/13; left lateral lower leg is still healed. He has a deep punched out rectangular  shaped wound on the right medial calf. Looking down at this it appears that he is attempting to epithelialize around the edges of the wound and on the base as well. His edema is reasonably well controlled we have been using collagen with absolutely no effect 1/20; left lateral lower leg remains closed he has extremitease stockings. The area on the right medial calf I aggressively debrided last week measures larger but the surface looks better. We have been using Hydrofera Blue. We ran Oasis through his insurance but we have not seen the results of this 1/27; left lower leg wound with chronic venous insufficiency and secondary lymphedema. I did aggressive debridement on this last week the wound seems to have come in healthy looking surface using Hydrofera Blue. He was denied for Oasis 2/3; small divot in the right medial lower leg. Under illumination the walls of this divot are epithelialized however the base has slough which I removed with a curette we have been using Hydrofera Blue 2/10 small divot on the right medial lower leg pinpoint illumination at the base of this cone-shaped wound. We have been using Hydrofera Blue but I will switch to calcium alginate this week 2/17; the small divot on the right medial lower leg is fully epithelialized. There is no visible open area under illumination. He has his own stocking for the right leg similar to the one he has been wearing on the left. 03/26/2022; READMISSION This is a now 77 year old man that we had in the clinic from 02/23/2020 through 07/05/2020. At that point he had bilateral lower extremity wounds left greater than right in the setting of severe lymphedema. He had already obtained compression pumps ordered for him I think from the wound care clinic in [facility] so I do not really have record of what he has been using. He claims to be using them once a day but there is a problem with the sleeve on the left leg. About 2 weeks ago he was  hospitalized from 03/11/2022 through 31/51/7616 with diastolic congestive heart failure. 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 He  was diuresed. Developed some prerenal azotemia and he has not been taking any diuretics currently. He has not been putting stockings on his legs since he got out of hospital and still has his legs dependent for long periods. Past medical history history of prostate cancer treated with prostatectomy and radiation this was apparently about 8 years ago, history of DVT on chronic Coumadin, history of lymphedema was managed for a while at the clinic in [place]. History of inguinal hernia repair in September 22, hypertension, stage IIIb chronic renal failure ABIs today were noncompressible on the right 1.12 on the left 04-02-2022 upon evaluation today patient appears to be doing well currently in regard to his legs I do feel like both areas that are draining are actually much drier than they were in the picture last week although the left is drier than the right. He is tolerating the 4-layer compression wraps at this point he did contact the pump company and they are actually working on getting him a new compression sleeve for one of his legs which have previously popped and was not functioning properly. 04-23-2022 upon evaluation today patient appears to be doing well currently in regard to his wounds on the legs. I am actually very pleased with where things stand and I do feel like that we are headed in the right direction. Fortunately there is no sign of active infection locally or systemically at this time. 05-07-2022 upon evaluation today patient appears to be doing well currently in regard to his wounds in fact things are showing signs of improvement which is good news I do not see too much that actually appears to be open and I am very pleased in that regard. No fevers, chills, nausea, vomiting, or diarrhea. Hartwig,  [name] ([id number]) [id number] Page 7 of 9 05-21-2022 upon evaluation today patient appears to be doing somewhat poorly in regard to drainage of his lower extremities bilaterally. The right is greater than left as far as the weeping area. Nonetheless it seems to be getting worse not better. He actually has pitting edema which is at least 2+ to the thighs and I am concerned about the fact that he is may be fluid overloaded in general and that is the reason why we cannot get this under control. I know he is not using his pumps all the time because he actually told the nurse that he was either going to pump or he was going to use his fluid pills but not do both. For that reason I do think that he needs to be really doing both in order to get the fluid out as effectively as possible obviously with the 4-layer compression wraps were doing as much as we can from a compression standpoint but it is really not enough. He tells me that he elevates his leg is much as he can in between pumping and other activity throughout the day. 05-28-2022 upon evaluation today patient appears to be doing better in regard to his wounds although the measurements may be a little bit larger this is a very difficult wound to heal it is very indistinct in a lot of areas. Nonetheless there is can be some need for sharp debridement in regard to both medial and lateral legs. Fortunately I see no signs of active infection locally nor systemically at this time. No fevers, chills, nausea, vomiting, or diarrhea. 06-04-2022 upon evaluation today patient appears to be doing poorly in general in regard to the wounds on his legs. He still continues  to have a tremendous amount of fluid not just in the lower portion of his leg but to be honest his thigh where he has 2-3+ pitting edema in the thigh as well. Unfortunately I do not know that we will be able to get this healed effectively and keep it healed on the lower  extremities unless he gets the overall fluid situation taking and under control. Fortunately I do not see any signs of infection locally nor systemically which is great news. He just seems to be very fluid overloaded. 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 He has been tolerating the dressing changes with the compression wraps without complication. Fortunately I do not see any evidence of infection at this time which is great news. No fevers, chills, nausea, vomiting, or diarrhea. 06-18-2022 upon evaluation today patient appears to be doing well currently in regard to his wounds as far as not looking like they are terribly infected but nonetheless I am concerned about a subacute infection secondary to the fact that he continues to have spreading despite the compression therapy. We actually did do an Unna boot on him last week this is actually the first wrap that actually stayed up everything else has been sliding down quite significantly. Fortunately there does not appear to be any signs of infection systemically at this time. With that being said I do believe that locally there seems to be an issue going on here and again I Georgina Peer do a PCR culture to see what that shows also think that I am going to put him on a broad-spectrum antibiotic, doxycycline to see how that will help as well. He does tell me that coming into the clinic today that he was feeling short of breath like "he was about to have a heart attack" because he was having such a hard time breathing. He says that he told this to Dr. Debbora Dus his cardiologist as well when he was evaluated in the past 1 to 2 weeks. Objective Constitutional Well-nourished and well-hydrated in no acute distress. Vitals Time Taken: 12:55 PM, Height: 74 in, Weight: 250 lbs, BMI: 32.1, Temperature: 98.9 F, Pulse: 102 bpm, Respiratory Rate: 18  breaths/min, Blood Pressure: 186/71 mmHg. Respiratory normal breathing without difficulty. Psychiatric this patient is able to make decisions and demonstrates good insight into disease process. Alert and Oriented x 3. pleasant and cooperative. General Notes: Upon inspection patient's wound bed actually showed signs of not really being an open wound terribly per se it is more skin breakdown in general but unfortunately both the left and the right are getting larger not smaller. We tried a 4-layer wrap does seem to be getting worse because it was constantly sliding down therefore we have gone to the [employer] wraps which seem to stay up better but again no matter what we do it does not seem to be improving like we want to see it do. For that reason I do want to see about getting a antibiotic started for him to see if this can make a difference. Integumentary (Hair, Skin) Wound #6 status is Open. Original cause of wound was Gradually Appeared. The date acquired was: 03/05/2022. The wound has been in treatment 12 weeks. The wound is located on the Right,Medial Lower Leg. The wound measures 12.5cm length x 21cm width x 0.1cm depth; 206.167cm^2 area and 20.617cm^3 volume. There is Fat Layer (Subcutaneous Tissue) exposed. There is no tunneling or undermining noted.  There is a medium amount of serosanguineous drainage noted. The wound margin is distinct with the outline attached to the wound base. There is small (1-33%) red, pink granulation within the wound bed. There is a large (67-100%) amount of necrotic tissue within the wound bed including Adherent Slough. The periwound skin appearance exhibited: Maceration. The periwound skin appearance did not exhibit: Callus, Crepitus, Excoriation, Induration, Rash, Scarring, Dry/Scaly, Atrophie Blanche, Cyanosis, Ecchymosis, Hemosiderin Staining, Mottled, Pallor, Rubor, Erythema. Wound #7 status is Open. Original cause of wound was Gradually Appeared. The date  acquired was: 03/05/2022. The wound has been in treatment 12 weeks. The wound is located on the Left,Circumferential Lower Leg. The wound measures 10cm length x 40cm width x 0.1cm depth; 314.159cm^2 area and 31.416cm^3 volume. There is Fat Layer (Subcutaneous Tissue) exposed. There is no tunneling or undermining noted. There is a medium amount of serosanguineous drainage noted. The wound margin is distinct with the outline attached to the wound base. There is medium (34-66%) red, pink granulation within the wound bed. There is a medium (34-66%) amount of necrotic tissue within the wound bed including Adherent Slough. The periwound skin appearance exhibited: Maceration. The periwound skin appearance did not exhibit: Callus, Crepitus, Excoriation, Induration, Rash, Scarring, Dry/Scaly, Atrophie Blanche, Cyanosis, Ecchymosis, Hemosiderin Staining, Mottled, Pallor, Rubor, Erythema. Wound #8 status is Converted. Original cause of wound was Gradually Appeared. The date acquired was: 06/11/2022. The wound has been in treatment 1 weeks. The wound is located on the Left,Anterior Lower Leg. The wound measures 10cm length x 40cm width x 0.1cm depth; 314.159cm^2 area and 31.416cm^3 volume. There is a medium amount of serosanguineous drainage noted. General Notes: converted left anterior lower leg #8 to and #7 to circ wounds. 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 There was a Haematologist Compression Therapy Procedure  by Deon Pilling, RN. Post procedure Diagnosis Wound #6: Same as Pre-Procedure Wound #7 Pre-procedure diagnosis of Wound #7 is a Lymphedema located on the Left,Circumferential Lower Leg . There was a Haematologist Compression Therapy Procedure by Deon Pilling, RN. Post procedure Diagnosis Wound #7: Same as Pre-Procedure Plan Follow-up Appointments: Return Appointment in 1 week. - w/ Jeri Cos (Dr. Dellia Nims covering) Wednesdays 130pm 06/25/2022 Return Appointment in 2 weeks. - w/ Jeri Cos Wednesdays 07/02/2022 130pm Other: - Will call you cardiologist to make aware of your shortness of breath with exertion, fluid overload in both legs, and elevated blood pressure. ****Go pick up oral antibiotic from your pharmacy.**** ***Will call you with culture if positive. If positive will order topical compounding antibiotics from [facility] They will call you to set up shipment and payment of topical antibiotics.**** Bathing/ Shower/ Hygiene: May shower with protection but do not get wound dressing(s) wet. Protect dressing(s) with water repellant cover (for example, large plastic bag) or a cast cover and may then take shower. Edema Control - Lymphedema / SCD / Other: Lymphedema Pumps. Use Lymphedema pumps on leg(s) 2-3 times a day for 45-60 minutes. If wearing any wraps or hose, do not remove them. Continue exercising as instructed. Elevate legs to the level of the heart or  above for 30 minutes daily and/or when sitting for 3-4 times a day throughout the day. Avoid standing for long periods of time. Exercise regularly Compression stocking or Garment 20-30 mm/Hg pressure to: - will place an order assure medi compression stockings from [facility]. Assure medical compression stockings open toe beige thigh with topband 20-30 size XL for both legs. The following medication(s) was prescribed: doxycycline hyclate oral 100 mg capsule 1 1 capsule oral twice a day x 14 days. do not take iron while on  this medication starting 06/18/2022 WOUND #6: - Lower Leg Wound Laterality: Right, Medial Cleanser: Soap and Water 1 x Per Week/7 Days Discharge Instructions: May shower and wash wound with dial antibacterial soap and water prior to dressing change. Peri-Wound Care: Sween Lotion (Moisturizing lotion) 1 x Per Week/7 Days Discharge Instructions: Apply moisturizing lotion as directed Prim Dressing: Sorbalgon AG Dressing, 4x4 (in/in) 1 x Per Week/7 Days ary Discharge Instructions: Apply to wound bed as instructed Secondary Dressing: ABD Pad, 5x9 1 x Per Week/7 Days Discharge Instructions: Apply over primary dressing as directed. Secondary Dressing: Woven Gauze Sponge, Non-Sterile 4x4 in 1 x Per Week/7 Days Discharge Instructions: Apply over primary dressing as directed. Com pression Wrap: Unnaboot w/Calamine, 4x10 (in/yd) 1 x Per Week/7 Days Discharge Instructions: Apply Unnaboot as directed. WOUND #7: - Lower Leg Wound Laterality: Left, Circumferential Cleanser: Soap and Water 1 x Per Week/7 Days Discharge Instructions: May shower and wash wound with dial antibacterial soap and water prior to dressing change. Peri-Wound Care: Sween Lotion (Moisturizing lotion) 1 x Per Week/7 Days Discharge Instructions: Apply moisturizing lotion as directed Prim Dressing: Sorbalgon AG Dressing, 4x4 (in/in) 1 x Per Week/7 Days ary Discharge Instructions: Apply to wound bed as instructed Secondary Dressing: ABD Pad, 5x9 1 x Per Week/7 Days Discharge Instructions: Apply over primary dressing as directed. Secondary Dressing: Woven Gauze Sponge, Non-Sterile 4x4 in 1 x Per Week/7 Days Discharge Instructions: Apply over primary dressing as directed. Com pression Wrap: Unnaboot w/Calamine, 4x10 (in/yd) 1 x Per Week/7 Days Discharge Instructions: Apply Unnaboot as directed. 1. I am good recommend currently that we have the patient going continue to monitor for any signs of infection or worsening. Based on what I am  seeing right now I do believe that he could have an infection of the wound on the right leg especially. Medical and put him on doxycycline and see if this could be beneficial. He is in agreement with that plan I will do it for 2 weeks time. 2. I am also going to recommend that we continue with the Unna boot wraps as they have stated better than anything that we have tried previous. 3. I would also suggest that the patient should continue to utilize the silver alginate dressing that we will get a kind of try different version of this to see if that would do better for him. 4. I am also can recommend depend on the results of the culture we may want to see about getting a Keystone topical antibiotic to see if this could be of benefit the patient is in agreement with the plan. Will see with the results of the PCR culture show. We will see patient back for reevaluation in 1 week here in the clinic. If anything worsens or changes patient will contact our office for additional Ging, Kelven ([id number]) [id number].pdf Page 9 of 9 recommendations. Electronic Signature(s) Signed: 06/18/2022 2:05:40 PM By: Worthy Keeler PA-C Entered By: Worthy Keeler on 06/18/2022 14:05:40 -------------------------------------------------------------------------------- SuperBill  Details Patient Name: Date of Service: Zepeda, [name] 06/18/2022 Medical Record Number: [id number] Patient Account Number: [id number] Date of Birth/Sex: Treating RN: 06/30/1945 (77 y.o. [name], [name] Primary Care Provider: [name] Other Clinician: Referring Provider: Treating Provider/Extender: [name] Weeks in Treatment: 12 Diagnosis Coding ICD-10 Codes Code Description [phone] Chronic venous hypertension (idiopathic) with ulcer and inflammation of bilateral lower extremity I89.0 Lymphedema, not elsewhere classified L97.828 Non-pressure chronic ulcer of other part of left  lower leg with other specified severity 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 other part of right lower leg with other specified severity Facility Procedures : CPT4: Code [id number] Description: 81 BILATERAL: Application of multi-layer venous compression system; leg (below knee), including ankle and t. Modifier: Quantity: 1 Physician Procedures : CPT4 Code Description Modifier [id number] - WC PHYS LEVEL 4 - EST PT ICD-10 Diagnosis Description I87.333 Chronic venous hypertension (idiopathic) with ulcer and inflammation of bilateral lower extremity I89.0 Lymphedema, not elsewhere classified  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 other part of right lower leg with other specified severity Quantity: 1 Electronic Signature(s) Signed: 06/18/2022 2:06:02 PM By: Worthy Keeler PA-C Entered By: Worthy Keeler on 06/18/2022 14:06:02

## 2022-06-20 NOTE — Progress Notes (Signed)
Maugeri, [name] [name] ([id number]) [id number] Page 1 of 9 Visit Report for 06/18/2022 Arrival Information Details Patient Name: Date of Service: [name], [name] 06/18/2022 12:45 PM Medical Record Number: [id number] Patient Account Number: [id number] Date of Birth/Sex: Treating RN: February 12, 1946 (77 y.o. M) Primary Care [name] [name]: [name] [name] Other Clinician: Referring [name] [name]: Treating [name] [name]/Extender: [name] [name] in Treatment: 12 Visit Information History Since Last Visit Added or deleted any medications: No Patient Arrived: Cane Any new allergies or adverse reactions: No Arrival Time: 12:52 Had a fall or experienced change in No Accompanied By: wife activities of daily living that may affect Transfer Assistance: None risk of falls: Patient Identification Verified: Yes Signs or symptoms of abuse/neglect since last visito No Secondary Verification Process Completed: Yes Hospitalized since last visit: No Patient Requires Transmission-Based Precautions: No Implantable device outside of the clinic excluding No Patient Has Alerts: Yes cellular tissue based products placed in the center Patient Alerts: Patient on Blood Thinner since last visit: Right ABI in clinic [place] Has Compression in Place as Prescribed: Yes Pain Present Now: No Electronic Signature(s) Signed: 06/20/2022 12:35:45 PM By: [name] [name] Entered By: [name] [name] on 06/18/2022 12:54:56 -------------------------------------------------------------------------------- Clinic Level of Care Assessment Details Patient Name: Date of Service: [name], [name] [name] 06/18/2022 12:45 PM Medical Record Number: [id number] Patient Account Number: [id number] Date of Birth/Sex: Treating RN: January 04, 1946 (77 y.o. [name] [name], [name] [name] Primary Care Sameerah Nachtigal: [name] [name] Other Clinician: Referring Shereta Crothers: Treating Emir Nack/Extender: [name] [name] in Treatment:  12 Clinic Level of Care Assessment Items TOOL 3 Quantity Score '[]'$  - 0 Use when EandM and Procedure is performed on FOLLOW-UP visit ASSESSMENTS - Nursing Assessment / Reassessment '[]'$  - 0 Reassessment of Co-morbidities (includes updates in patient status) '[]'$  - 0 Reassessment of Adherence to Treatment Plan ASSESSMENTS - Wound and Skin Assessment / Reassessment '[]'$  - Points for Wound Assessment can only be taken for a new wound of unknown or different etiology and a procedure is 0 NOT performed to that wound '[]'$  - 0 Simple Wound Assessment / Reassessment - one wound '[]'$  - 0 Complex Wound Assessment / Reassessment - multiple wounds '[]'$  - 0 Dermatologic / Skin Assessment (not related to wound area) ASSESSMENTS - Focused Assessment '[]'$  - 0 Circumferential Edema Measurements - multi extremities [name] [name], [name] [name] ([id number]) [id number] Page 2 of 9 '[]'$  - 0 Nutritional Assessment / Counseling / Intervention '[]'$  - 0 Lower Extremity Assessment (monofilament, tuning fork, pulses) '[]'$  - 0 Peripheral Arterial Disease Assessment (using hand held doppler) ASSESSMENTS - Ostomy and/or Continence Assessment and Care '[]'$  - 0 Incontinence Assessment and Management '[]'$  - 0 Ostomy Care Assessment and Management (repouching, etc.) PROCESS - Coordination of Care '[]'$  - Points for Discharge Coordination can only be taken for a new wound of unknown or different etiology and a 0 procedure is NOT performed to that wound '[]'$  - 0 Simple Patient / Family Education for ongoing care '[]'$  - 0 Complex (extensive) Patient / Family Education for ongoing care '[]'$  - 0 Staff obtains Programmer, systems, Records, T Results / Process Orders est '[]'$  - 0 Staff telephones HHA, Nursing Homes / Clarify orders / etc '[]'$  - 0 Routine Transfer to another Facility (non-emergent condition) '[]'$  - 0 Routine Hospital Admission (non-emergent condition) '[]'$  - 0 New Admissions / Biomedical engineer / Ordering NPWT Apligraf,  etc. , '[]'$  - 0 Emergency Hospital Admission (emergent condition) '[]'$  - 0 Simple Discharge Coordination '[]'$  - 0 Complex (extensive) Discharge Coordination PROCESS - Special Needs '[]'$  -  0 Pediatric / Minor Patient Management '[]'$  - 0 Isolation Patient Management '[]'$  - 0 Hearing / Language / Visual special needs '[]'$  - 0 Assessment of Community assistance (transportation, D/C planning, etc.) '[]'$  - 0 Additional assistance / Altered mentation '[]'$  - 0 Support Surface(s) Assessment (bed, cushion, seat, etc.) INTERVENTIONS - Wound Cleansing / Measurement '[]'$  - Points for Wound Cleaning / Measurement, Wound Dressing, Specimen Collection and Specimen taken to lab can 0 only be taken for a new wound of unknown or different etiology and a procedure is NOT performed to that wound '[]'$  - 0 Simple Wound Cleansing - one wound '[]'$  - 0 Complex Wound Cleansing - multiple wounds '[]'$  - 0 Wound Imaging (photographs - any number of wounds) '[]'$  - 0 Wound Tracing (instead of photographs) '[]'$  - 0 Simple Wound Measurement - one wound '[]'$  - 0 Complex Wound Measurement - multiple wounds INTERVENTIONS - Wound Dressings '[]'$  - 0 Small Wound Dressing one or multiple wounds '[]'$  - 0 Medium Wound Dressing one or multiple wounds '[]'$  - 0 Large Wound Dressing one or multiple wounds INTERVENTIONS - Miscellaneous '[]'$  - 0 External ear exam X- 1 5 Specimen Collection (cultures, biopsies, blood, body fluids, etc.) '[]'$  - 0 Specimen(s) / Culture(s) sent or taken to Lab for analysis '[]'$  - 0 Patient Transfer (multiple staff / [name] [name] Lift / Similar devices) '[]'$  - 0 Simple Staple / Suture removal (25 or less) [name], [name] [name] ([id number]) [id number] Page 3 of 9 '[]'$  - 0 Complex Staple / Suture removal (26 or more) '[]'$  - 0 Hypo / Hyperglycemic Management (close monitor of Blood Glucose) '[]'$  - 0 Ankle / Brachial Index (ABI) - do not check if billed separately '[]'$  - 0 Vital Signs Has the patient been seen at the  hospital within the last three years: Yes Total Score: 5 Level Of Care: New/Established - Level 1 Electronic Signature(s) Signed: 06/19/2022 5:30:52 PM By: [name], [name] Entered By: Deon Pilling on 06/18/2022 14:01:06 -------------------------------------------------------------------------------- Compression Therapy Details Patient Name: Date of Service: [name], [name] 06/18/2022 12:45 PM Medical Record Number: [id number] Patient Account Number: [id number] Date of Birth/Sex: Treating RN: 09-24-1945 (77 y.o. [name] Primary Care Chrisy Hillebrand: [name] [name] Other Clinician: Referring Alicha Raspberry: Treating Elle Vezina/Extender: [name] Weeks in Treatment: 12 Compression Therapy Performed for Wound Assessment: Wound #6 Right,Medial Lower Leg Performed By: Clinician Deon Pilling, RN Compression Type: Rolena Infante Post Procedure Diagnosis Same as Pre-procedure Electronic Signature(s) Signed: 06/19/2022 5:30:52 PM By: [name], [name] Entered By: Deon Pilling on 06/18/2022 13:51:27 -------------------------------------------------------------------------------- Compression Therapy Details Patient Name: Date of Service: [name], [name] 06/18/2022 12:45 PM Medical Record Number: [id number] Patient Account Number: [id number] Date of Birth/Sex: Treating RN: [birth year] (76 y.o. [name] Primary Care Savonna Birchmeier: [name] [name] Other Clinician: Referring Artice Bergerson: Treating Jaelah Hauth/Extender: [name] Weeks in Treatment: 12 Compression Therapy Performed for Wound Assessment: Wound #7 Left,Circumferential Lower Leg Performed By: Clinician Deon Pilling, RN Compression Type: Rolena Infante Post Procedure Diagnosis Same as Pre-procedure Electronic Signature(s) Signed: 06/19/2022 5:30:52 PM By: [name], [name] Entered By: Deon Pilling on 06/18/2022 13:51:27 Quadros, [name] [name] ([id number]) [id number] Page  4 of 9 -------------------------------------------------------------------------------- Lower Extremity Assessment Details Patient Name: Date of Service: [name], [name] 06/18/2022 12:45 PM Medical Record Number: [id number] Patient Account Number: [id number] Date of Birth/Sex: Treating RN: 03/26/1946 (77 y.o. M) Primary Care Emylia Latella: [name] [name] Other Clinician: Referring Dawson Albers: Treating Avacyn Kloosterman/Extender: [name], Benjaman Kindler  in Treatment: 12 Edema Assessment Assessed: [Left: No] [Right: No] Edema: [Left: Yes] [Right: Yes] Calf Left: Right: Point of Measurement: 40 cm From Medial Instep 59 cm 65 cm Ankle Left: Right: Point of Measurement: 13 cm From Medial Instep 44 cm 55 cm Electronic Signature(s) Signed: 06/20/2022 12:35:45 PM By: [name] [name] Entered By: [name] [name] on 06/18/2022 13:14:48 -------------------------------------------------------------------------------- Multi-Disciplinary Care Plan Details Patient Name: Date of Service: [name], [name] 06/18/2022 12:45 PM Medical Record Number: [id number] Patient Account Number: [id number] Date of Birth/Sex: Treating RN: 1946/02/18 (77 y.o. [name] Primary Care Bartlomiej Jenkinson: [name] [name] Other Clinician: Referring Azelia Reiger: Treating Berthold Glace/Extender: [name] Weeks in Treatment: 12 Active Inactive Wound/Skin Impairment Nursing Diagnoses: Impaired tissue integrity Knowledge deficit related to ulceration/compromised skin integrity Goals: Patient will have a decrease in wound volume by X% from date: (specify in notes) Date Initiated: 03/26/2022 Target Resolution Date: 07/18/2022 Goal Status: Active Patient/caregiver will verbalize understanding of skin care regimen Date Initiated: 03/26/2022 Target Resolution Date: 07/18/2022 Goal Status: Active Ulcer/skin breakdown will have a volume reduction of 30% by week 4 Date Initiated: 03/26/2022 Date Inactivated:  05/21/2022 Target Resolution Date: 05/17/2022 Unmet Reason: see wound Goal Status: Unmet measurement. Ulcer/skin breakdown will have a volume reduction of 50% by week 8 Date Initiated: 03/26/2022 Date Inactivated: 05/21/2022 Target Resolution Date: 05/17/2022 Unmet Reason: see wound Goal Status: Unmet measurement. Plude, [name] [name] ([id number]) [id number] Page 5 of 9 Interventions: Assess patient/caregiver ability to obtain necessary supplies Assess patient/caregiver ability to perform ulcer/skin care regimen upon admission and as needed Assess ulceration(s) every visit Notes: Patient stated today, "I will take my fluid pill or pump not do both." Hunter Bachar made aware. Electronic Signature(s) Signed: 06/19/2022 5:30:52 PM By: [name], [name] Entered By: Deon Pilling on 06/18/2022 13:38:10 -------------------------------------------------------------------------------- Pain Assessment Details Patient Name: Date of Service: [name], [name] 06/18/2022 12:45 PM Medical Record Number: [id number] Patient Account Number: [id number] Date of Birth/Sex: Treating RN: [birth year] (77 y.o. M) Primary Care Veeda Virgo: [name] [name] Other Clinician: Referring Karis Emig: Treating Ashlan Dignan/Extender: [name] Weeks in Treatment: 12 Active Problems Location of Pain Severity and Description of Pain Patient Has Paino No Site Locations Pain Management and Medication Current Pain Management: Electronic Signature(s) Signed: 06/20/2022 12:35:45 PM By: [name] [name] Entered By: [name] [name] on 06/18/2022 12:55:55 -------------------------------------------------------------------------------- Patient/Caregiver Education Details Patient Name: Date of Service: Breunig, [name] 1/31/2024andnbsp12:45 PM Medical Record Number: [id number] Patient Account Number: [id number] Date of Birth/Gender: Treating RN: 1945-11-30 (77 y.o. [name] Primary Care  Physician: [name] [name] Other Clinician: Gosa, [name] [name] ([id number]) [id number] Page 6 of 9 Referring Physician: Treating Physician/Extender: [name] [name] in Treatment: 12 Education Assessment Education Provided To: Patient Education Topics Provided Wound/Skin Impairment: Handouts: Caring for Your Ulcer Methods: Explain/Verbal Responses: State content correctly Electronic Signature(s) Signed: 06/19/2022 5:30:52 PM By: [name], [name] Entered By: Deon Pilling on 06/18/2022 13:38:21 -------------------------------------------------------------------------------- Wound Assessment Details Patient Name: Date of Service: [name], [name] 06/18/2022 12:45 PM Medical Record Number: [id number] Patient Account Number: [id number] Date of Birth/Sex: Treating RN: [birth year] (77 y.o. M) Primary Care Clarke Peretz: [name] [name] Other Clinician: Referring Elih Mooney: Treating Anelisse Jacobson/Extender: [name] Weeks in Treatment: 12 Wound Status Wound Number: 6 Primary Lymphedema Etiology: Wound Location: Right, Medial Lower Leg Wound Open Wounding Event: Gradually Appeared Status: Date Acquired: 03/05/2022 Comorbid Anemia, Lymphedema, Deep Vein Thrombosis, Hypertension, Weeks Of Treatment: 12 History: Received Radiation Clustered Wound: Yes  Photos Wound Measurements Length: (cm) Width: (cm) Depth: (cm) Clustered Quantity: Area: (cm) Volume: (cm) 12.5 % Reduction in Area: -44.2% 21 % Reduction in Volume: -44.2% 0.1 Epithelialization: Medium (34-66%) 1 Tunneling: No 206.167 Undermining: No 20.617 Wound Description Classification: Full Thickness Without Exposed Support Structures Wound Margin: Distinct, outline attached Exudate Amount: Medium Exudate Type: Serosanguineous Exudate Color: red, brown Shamblin, Ekansh ([id number]) Foul Odor After Cleansing: No Slough/Fibrino  Yes [phone] Page 7 of 9 Wound Bed Granulation Amount: Small (1-33%) Exposed Structure Granulation Quality: Red, Pink Fascia Exposed: No Necrotic Amount: Large (67-100%) Fat Layer (Subcutaneous Tissue) Exposed: Yes Necrotic Quality: Adherent Slough Tendon Exposed: No Muscle Exposed: No Joint Exposed: No Bone Exposed: No Periwound Skin Texture Texture Color No Abnormalities Noted: No No Abnormalities Noted: No Callus: No Atrophie Blanche: No Crepitus: No Cyanosis: No Excoriation: No Ecchymosis: No Induration: No Erythema: No Rash: No Hemosiderin Staining: No Scarring: No Mottled: No Pallor: No Moisture Rubor: No No Abnormalities Noted: No Dry / Scaly: No Maceration: Yes Electronic Signature(s) Signed: 06/20/2022 12:35:45 PM By: [name] [name] Entered By: [name] [name] on 06/18/2022 13:36:58 -------------------------------------------------------------------------------- Wound Assessment Details Patient Name: Date of Service: [name], [name] 06/18/2022 12:45 PM Medical Record Number: [id number] Patient Account Number: [id number] Date of Birth/Sex: Treating RN: 1945-08-06 (77 y.o. [name] [name], [name] [name] Primary Care Azrael Maddix: [name] [name] Other Clinician: Referring Lashane Whelpley: Treating Tadhg Eskew/Extender: [name] Weeks in Treatment: 12 Wound Status Wound Number: 7 Primary Lymphedema Etiology: Wound Location: Left, Circumferential Lower Leg Wound Open Wounding Event: Gradually Appeared Status: Date Acquired: 03/05/2022 Comorbid Anemia, Lymphedema, Deep Vein Thrombosis, Hypertension, Weeks Of Treatment: 12 History: Received Radiation Clustered Wound: Yes Wound Measurements Length: (cm) Width: (cm) Depth: (cm) Clustered Quantity: Area: (cm) Volume: (cm) 10 % Reduction in Area: -566.7% 40 % Reduction in Volume: -566.7% 0.1 Epithelialization: Medium (34-66%) 6 Tunneling: No 314.159 Undermining:  No 31.416 Wound Description Classification: Full Thickness Without Exposed Sup Wound Margin: Distinct, outline attached Exudate Amount: Medium Exudate Type: Serosanguineous Exudate Color: red, brown port Structures Foul Odor After Cleansing: No Slough/Fibrino Yes Wound Bed Granulation Amount: Medium (34-66%) Exposed Structure Granulation Quality: Red, Pink Fascia Exposed: No Necrotic Amount: Medium (34-66%) Fat Layer (Subcutaneous Tissue) Exposed: Yes Necrotic Quality: Adherent Slough Tendon Exposed: No Killam, Thamas ([id number]) [id number] Page 8 of 9 Muscle Exposed: No Joint Exposed: No Bone Exposed: No Periwound Skin Texture Texture Color No Abnormalities Noted: No No Abnormalities Noted: No Callus: No Atrophie Blanche: No Crepitus: No Cyanosis: No Excoriation: No Ecchymosis: No Induration: No Erythema: No Rash: No Hemosiderin Staining: No Scarring: No Mottled: No Pallor: No Moisture Rubor: No No Abnormalities Noted: No Dry / Scaly: No Maceration: Yes Electronic Signature(s) Signed: 06/19/2022 5:30:52 PM By: [name], [name] Entered By: Deon Pilling on 06/18/2022 13:51:10 -------------------------------------------------------------------------------- Wound Assessment Details Patient Name: Date of Service: [name], [name] 06/18/2022 12:45 PM Medical Record Number: [id number] Patient Account Number: [id number] Date of Birth/Sex: Treating RN: 1946/05/06 (77 y.o. M) Primary Care Girl Schissler: [name] [name] Other Clinician: Referring Deegan Valentino: Treating Marcelle Bebout/Extender: [name], [name] Weeks in Treatment: 12 Wound Status Wound Number: 8 Primary Lymphedema Etiology: Wound Location: Left, Anterior Lower Leg Wound Converted Wounding Event: Gradually Appeared Status: Date Acquired: 06/11/2022 Comorbid Anemia, Lymphedema, Deep Vein Thrombosis, Hypertension, Weeks Of Treatment: 1 History: Received  Radiation Clustered Wound: No Wound Measurements Length: (cm) 10 Width: (cm) 40 Depth: (cm) 0.1 Area: (cm) 314.159 Volume: (cm) 31.416 % Reduction in Area: -2566.7% % Reduction in Volume: -2566.9%  Wound Description Classification: Full Thickness Without Exposed Suppo Exudate Amount: Medium Exudate Type: Serosanguineous Exudate Color: red, brown rt Structures Periwound Skin Texture Texture Color No Abnormalities Noted: No No Abnormalities Noted: No Moisture No Abnormalities Noted: No Assessment Notes converted left anterior lower leg #8 to and #7 to circ wounds. Electronic Signature(s) Signed: 06/20/2022 12:35:45 PM By: [name] [name] Entered By: [name] [name] on 06/18/2022 13:36:45 Shedrick, [name] [name] ([id number]) [id number] Page 9 of 9 -------------------------------------------------------------------------------- Vitals Details Patient Name: Date of Service: [name], [name] 06/18/2022 12:45 PM Medical Record Number: [id number] Patient Account Number: [id number] Date of Birth/Sex: Treating RN: [birth year] (77 y.o. M) Primary Care Elisia Stepp: [name] [name] Other Clinician: Referring Troi Florendo: Treating Daphney Hopke/Extender: [name] Weeks in Treatment: 12 Vital Signs Time Taken: 12:55 Temperature (F): 98.9 Height (in): 74 Pulse (bpm): 102 Weight (lbs): 250 Respiratory Rate (breaths/min): 18 Body Mass Index (BMI): 32.1 Blood Pressure (mmHg): 186/71 Reference Range: 80 - 120 mg / dl Electronic Signature(s) Signed: 06/20/2022 12:35:45 PM By: [name] [name] Entered By: [name] [name] on 06/18/2022 12:55:47

## 2022-06-25 ENCOUNTER — Ambulatory Visit (HOSPITAL_BASED_OUTPATIENT_CLINIC_OR_DEPARTMENT_OTHER): Payer: Medicare Other | Admitting: Internal Medicine

## 2022-07-02 ENCOUNTER — Encounter (HOSPITAL_BASED_OUTPATIENT_CLINIC_OR_DEPARTMENT_OTHER): Payer: Medicare Other | Attending: Internal Medicine | Admitting: Physician Assistant

## 2022-07-02 DIAGNOSIS — I890 Lymphedema, not elsewhere classified: Secondary | ICD-10-CM | POA: Insufficient documentation

## 2022-07-02 DIAGNOSIS — I5032 Chronic diastolic (congestive) heart failure: Secondary | ICD-10-CM | POA: Diagnosis not present

## 2022-07-02 DIAGNOSIS — L97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 other part of right lower leg with other specified severity: Secondary | ICD-10-CM | POA: Insufficient documentation

## 2022-07-02 DIAGNOSIS — I87333 Chronic venous hypertension (idiopathic) with ulcer and inflammation of bilateral lower extremity: Secondary | ICD-10-CM | POA: Diagnosis present

## 2022-07-02 DIAGNOSIS — L97828 Non-pressure chronic ulcer of other part of left lower leg with other specified severity: Secondary | ICD-10-CM | POA: Diagnosis not present

## 2022-07-02 DIAGNOSIS — I872 Venous insufficiency (chronic) (peripheral): Secondary | ICD-10-CM | POA: Insufficient documentation

## 2022-07-02 DIAGNOSIS — N189 Chronic kidney disease, unspecified: Secondary | ICD-10-CM | POA: Insufficient documentation

## 2022-07-02 DIAGNOSIS — I130 Hypertensive heart and chronic kidney disease with heart failure and stage 1 through stage 4 chronic kidney disease, or unspecified chronic kidney disease: Secondary | ICD-10-CM | POA: Diagnosis not present

## 2022-07-02 DIAGNOSIS — Z8546 Personal history of malignant neoplasm of prostate: Secondary | ICD-10-CM | POA: Insufficient documentation

## 2022-07-02 DIAGNOSIS — Z7901 Long term (current) use of anticoagulants: Secondary | ICD-10-CM | POA: Diagnosis not present

## 2022-07-02 DIAGNOSIS — Z86718 Personal history of other venous thrombosis and embolism: Secondary | ICD-10-CM | POA: Diagnosis not present

## 2022-07-03 ENCOUNTER — Encounter (HOSPITAL_BASED_OUTPATIENT_CLINIC_OR_DEPARTMENT_OTHER): Payer: Medicare Other | Admitting: Internal Medicine

## 2022-07-03 DIAGNOSIS — I87333 Chronic venous hypertension (idiopathic) with ulcer and inflammation of bilateral lower extremity: Secondary | ICD-10-CM | POA: Diagnosis not present

## 2022-07-03 NOTE — Progress Notes (Addendum)
[name] ([id number] Page 1 of 10 Visit Report for 07/02/2022 Chief Complaint Document Details Patient Name: Date of Service: [name] 07/02/2022 1:30 PM Medical Record Number: [id number] Patient Account Number: [id number] Date of Birth/Sex: Treating RN: May 29, 1945 (77 y.o. M) Primary Care Provider: [name] Other Clinician: Referring Provider: Treating Provider/Extender: [name] Weeks in Treatment: 14 Information Obtained from: Patient Chief Complaint 02/23/2020; patient is here for wounds on his bilateral lower legs in the setting of severe lymphedema 03/26/2022; patient is here for wounds on his bilateral lower legs medial aspect Electronic Signature(s) Signed: 07/02/2022 2:06:14 PM By: Worthy Keeler PA-C Entered By: Worthy Keeler on 07/02/2022 14:06:13 -------------------------------------------------------------------------------- HPI Details Patient Name: Date of Service: [name] 07/02/2022 1:30 PM Medical Record Number: [id number] Patient Account Number: [id number] Date of Birth/Sex: Treating RN: 12-Mar-1946 (77 y.o. M) Primary Care Provider: [name] Other Clinician: Referring Provider: Treating Provider/Extender: [name] Weeks in Treatment: 14 History of Present Illness HPI Description: ADMISSION 02/23/2020 Patient is a 77 year old man who lives in [place] who arrives accompanied by his wife. He has a history of chronic lymphedema and venous insufficiency in his bilateral lower legs which may have something to do that with having a history of DVT as well as being treated for prostate cancer. In any case he recently got compression pumps at home but compliance has been an issue here. He has compression stockings however they are probably not sufficient enough to control swelling. They tell us that things deteriorated for him in late August he was  admitted to [facility] for 7 days. This was with cellulitis I think of his bilateral lower legs. Discharge he was noted to have wounds on his bilateral lower legs. He was discharged on Bactrim. They tried to get him home health through [facility] of course they declined him. His wife is been wrapping these applying some form of silver foam dressing. He has a history of wounds before although nothing that would not heal with basic home topical dressings. He has 2 areas on the left medial, left anterior and left lateral and a smaller area on the right medial. All of these have considerable depth. Past medical history includes iron deficiency anemia, lymphedema followed by the rehab center at [facility] with lymphedema wraps I believe, DVT on chronic anticoagulation, prostate cancer, chronic venous insufficiency, hypertension. As mentioned he has compression pumps but does not use them. ABIs in our clinic were noncompressible bilaterally 10/14; patient with severe bilateral lymphedema right greater than left. He came in with bilateral lower extremity wounds left greater than right. Even though the right side has more of the edema most of the wounds here almost closed on the right medial. He has 3 remaining wounds on the left We have been using silver alginate under 4-layer compression I have been trying to get him to be compliant with his external compression pumps 10/21; patient with 3 small wounds on the left leg and 1 on the right medial in the setting of severe lymphedema and chronic venous insufficiency. We have been using silver alginate under 4-layer compression he is using his external compression pumps twice a day 11/4; ARTERIAL STUDIES on the right show an ABI of 1.02 TBI of 0.858 with biphasic waveforms on the left 0.98 with a TBI of 0.55 and biphasic waveforms. Does not look like he has significant arterial disease. We are treating  him for lymphedema he has compression  pumps. He has punched-out areas on the left anterior left lateral and right medial lower extremities 11/11; after we obtained his arterial studies I put him in 4 layer compression. He is using his compression pumps probably once a day although I have asked [place], Kasandra [name] ([id number]) [id number].pdf Page 2 of 10 him to do twice. Primary dressing to the wound is silver collagen he has severe lymphedema likely secondary to chronic venous insufficiency. Wounds on the left lateral, left medial and left anterior and a small area on the right medial 12/2; the area on the right anterior lower leg has healed. We initially thought that the area medially had healed as well however when her discharge nurse came in she detected fluid in the wound simply opened up. This is actually worse than I remember this pain. The area on the left lateral potentially slightly smaller He is also complaining about pain in his left hand he says that this is actually been getting some better he has been using topical creams on this. She asked that I look at this 12/9 after last weeks issues we have 2 wounds one on the right medial lower leg and 1 on the left lateral. Both of these are in the same condition. I think because of thickened skin secondary to chronic lymphedema these wounds actually have depth of almost 0.8 cm. 12/16; the patient has 2 small but deep wounds one on the right medial and one on the left lateral. The right medial is actually the worst of these. He arrives in clinic today with absolutely terrible edema in the right leg apparently his 4-layer wrap fell down to just above his ankle he did not think about this he is apparently been continuing to use his compression pump twice a day. The left leg looks a lot better. 05/09/2020 upon evaluation today patient appears to be doing decently well in regard to his wounds. Everything is measuring smaller the right leg still has a little bit  deeper wound in the left seems to be almost completely healed in my opinion I am very pleased in general with how things are progressing. He has a 4- layer compression wrap we have been using endoform today we will probably have to use collagen just based on the fact that we do not have endoform it is on order. 1/6; the patient's wound on the left lateral lower leg has healed. Still has 1 on the right medial. 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 Using compression pumps at home twice a day. 1/13; left lateral lower leg is still healed. He has a deep punched out rectangular shaped wound on the right medial calf. Looking down at this it appears that he is attempting to epithelialize around the edges of the wound and on the base as well. His edema is reasonably well controlled we have been using collagen with absolutely no effect 1/20; left lateral lower leg remains closed he has extremitease stockings. The area on the right medial calf I aggressively debrided last week measures larger but the surface looks better. We have been using Hydrofera Blue. We ran Oasis through his insurance but we have not seen the results of this 1/27; left lower leg wound with chronic venous insufficiency and secondary lymphedema. I did aggressive debridement on this last week the wound seems to have come in healthy looking surface using Hydrofera Blue. He was denied for Oasis 2/3; small divot in the right medial  lower leg. Under illumination the walls of this divot are epithelialized however the base has slough which I removed with a curette we have been using Hydrofera Blue 2/10 small divot on the right medial lower leg pinpoint illumination at the base of this cone-shaped wound. We have been using Hydrofera Blue but I will switch to calcium alginate this week 2/17; the small divot on the right medial lower leg is fully epithelialized. There is no visible open area under illumination. He has his own  stocking for the right leg similar to the one he has been wearing on the left. 03/26/2022; READMISSION This is a now 77 year old man that we had in the clinic from 02/23/2020 through 07/05/2020. At that point he had bilateral lower extremity wounds left greater than right in the setting of severe lymphedema. He had already obtained compression pumps ordered for him I think from the wound care clinic in [facility] so I do not really have record of what he has been using. He claims to be using them once a day but there is a problem with the sleeve on the left leg. About 2 weeks ago he was hospitalized from 03/11/2022 through [id number] with diastolic congestive heart failure. 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 He was diuresed. Developed some prerenal azotemia and he has not been taking any diuretics currently. He has not been putting stockings on his legs since he got out of hospital and still has his legs dependent for long periods. Past medical history history of prostate cancer treated with prostatectomy and radiation this was apparently about 8 years ago, history of DVT on chronic Coumadin, history of lymphedema was managed for a while at the clinic in [place]. History of inguinal hernia repair in September 22, hypertension, stage IIIb chronic renal failure ABIs today were noncompressible on the right 1.12 on the left 04-02-2022 upon evaluation today patient appears to be doing well currently in regard to his legs I do feel like both areas that are draining are actually much drier than they were in the picture last week although the left is drier than the right. He is tolerating the 4-layer compression wraps at this point he did contact the pump company and they are actually working on getting him a new compression sleeve for one of his legs which have previously popped and was not functioning properly. 04-23-2022 upon evaluation today  patient appears to be doing well currently in regard to his wounds on the legs. I am actually very pleased with where things stand and I do feel like that we are headed in the right direction. Fortunately there is no sign of active infection locally or systemically at this time. 05-07-2022 upon evaluation today patient appears to be doing well currently in regard to his wounds in fact things are showing signs of improvement which is good news I do not see too much that actually appears to be open and I am very pleased in that regard. No fevers, chills, nausea, vomiting, or diarrhea. 05-21-2022 upon evaluation today patient appears to be doing somewhat poorly in regard to drainage of his lower extremities bilaterally. The right is greater than left as far as the weeping area. Nonetheless it seems to be getting worse not better. He actually has pitting edema which is at least 2+ to the thighs and I am concerned about the fact that he is may be fluid overloaded in general and that is the  reason why we cannot get this under control. I know he is not using his pumps all the time because he actually told the nurse that he was either going to pump or he was going to use his fluid pills but not do both. For that reason I do think that he needs to be really doing both in order to get the fluid out as effectively as possible obviously with the 4-layer compression wraps were doing as much as we can from a compression standpoint but it is really not enough. He tells me that he elevates his leg is much as he can in between pumping and other activity throughout the day. 05-28-2022 upon evaluation today patient appears to be doing better in regard to his wounds although the measurements may be a little bit larger this is a very difficult wound to heal it is very indistinct in a lot of areas. Nonetheless there is can be some need for sharp debridement in regard to both medial and lateral legs. Fortunately I see no signs  of active infection locally nor systemically at this time. No fevers, chills, nausea, vomiting, or diarrhea. 06-04-2022 upon evaluation today patient appears to be doing poorly in general in regard to the wounds on his legs. He still continues to have a tremendous amount of fluid not just in the lower portion of his leg but to be honest his thigh where he has 2-3+ pitting edema in the thigh as well. Unfortunately I do not know that we will be able to get this healed effectively and keep it healed on the lower extremities unless he gets the overall fluid situation taking and under control. Fortunately I do not see any signs of infection locally nor systemically which is great news. He just seems to be very fluid overloaded. 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 He has been tolerating the dressing changes with the compression wraps without complication. Fortunately I do not see any evidence of infection at this time which is great news. No fevers, chills, nausea, vomiting, or diarrhea. 06-18-2022 upon evaluation today patient appears to be doing well currently in regard to his wounds as far as not looking like they are terribly infected but nonetheless I am concerned about a subacute infection secondary to the fact that he continues to have spreading despite the compression therapy. We actually did do an Unna boot on him last week this is actually the first wrap that actually stayed up everything else has been sliding down quite significantly. Fortunately there does not appear to be any signs of infection systemically at this time. With that being said I do believe that locally there seems to be an issue going on here and again I Georgina Peer do a PCR culture to see what that shows also think that I am going to put him on a broad-spectrum antibiotic, doxycycline to see how that will help as well.  He does tell me that coming into the clinic today that he was feeling short of breath like "he was about to have a heart attack" because he was having such a hard time breathing. He says that he told this to Dr. Debbora Dus his cardiologist as well when he was evaluated in the [place] ([id number]) [phone].pdf Page 3 of 10 past 1 to 2 weeks. 07-02-2022 upon evaluation today patient appears to be doing poorly currently in regard to his wound. He has been tolerating the  dressing changes. Unfortunately he has not had any compression wraps on for the past week because he was unable to make it in for his appointment last week. With that being said he has a significant amount of drainage he tells me has been using his pumps but despite this in the pumps he still has been draining quite a bit. The drainage is also somewhat purulent unfortunately. We did attempt to get in touch with his cardiologist last week unfortunately we were unable to get up with him I did advise that the patient needs to get in touch with him upon leaving today in order to make sure they know he is on the new antibiotics I am going to send him this will be Levaquin and Augmentin. Electronic Signature(s) Signed: 07/02/2022 2:46:20 PM By: Worthy Keeler PA-C Entered By: Worthy Keeler on 07/02/2022 14:46:20 -------------------------------------------------------------------------------- Physical Exam Details Patient Name: Date of Service: Smallman, DA [name] 07/02/2022 1:30 PM Medical Record Number: [id number] Patient Account Number: [id number] Date of Birth/Sex: Treating RN: [birth year] (77 y.o. M) Primary Care Provider: [name] Other Clinician: Referring Provider: Treating Provider/Extender: [name] Weeks in Treatment: 66 Constitutional Well-nourished and well-hydrated in no acute distress. Respiratory normal breathing without difficulty. Psychiatric this patient is  able to make decisions and demonstrates good insight into disease process. Alert and Oriented x 3. pleasant and cooperative. Notes Upon inspection patient's wound bed actually showed signs of again being significantly worse. This is more of a venous issue and unfortunately he has a tremendous amount of drainage noted which is somewhat purulent in nature. I think we need to get the infection under control he did not have his Keystone antibiotics with him today it has been delivered yesterday but they gave it to his son who has apparently the same name did not realize that it was he has he tells me. Nonetheless I think he needs to get that we probably need to put it on tomorrow. Electronic Signature(s) Signed: 07/02/2022 2:46:51 PM By: Worthy Keeler PA-C Entered By: Worthy Keeler on 07/02/2022 14:46:50 -------------------------------------------------------------------------------- Physician Orders Details Patient Name: Date of Service: Teng, DA [name] 07/02/2022 1:30 PM Medical Record Number: [id number] Patient Account Number: [id number] Date of Birth/Sex: Treating RN: 07-07-1945 (77 y.o. [name] Primary Care Provider: [name] Other Clinician: Referring Provider: Treating Provider/Extender: [name] in Treatment: 69 Verbal / Phone Orders: No Diagnosis Coding ICD-10 Coding Code Description [phone] Chronic venous hypertension (idiopathic) with ulcer and inflammation of bilateral lower extremity I89.0 Lymphedema, not elsewhere classified L97.828 Non-pressure chronic ulcer of other part of left lower leg with other specified severity Mcconaghy, Jery ([id number] Page 4 of 10 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 other part of right lower leg with other specified severity Follow-up Appointments ppointment in 1 week. - Lanae Crumbly Wednesdays 07/09/2022 1245pm Return A ppointment in 2 weeks. - Lanae Crumbly Wednesdays 07/16/2022 215pm Return A Nurse Visit: - 245pm Dr. Heber Walbridge side *****Margretta [name] IN TOPICAL ANTIBIOTICS.***** Other: - Will call you cardiologist to make aware of your shortness of breath with exertion, fluid overload in both legs, and elevated blood pressure. ****Go pick up oral antibiotic from your pharmacy.**** New antibiotics levaquin and Augmentin start that today.***** ****Please bring the topical antibiotics at each visit.**** Bathing/ Shower/ Hygiene May shower with protection but do not get wound dressing(s) wet. Protect dressing(s) with water repellant cover (for example, large plastic bag) or a  cast cover and may then take shower. Edema Control - Lymphedema / SCD / Other Lymphedema Pumps. Use Lymphedema pumps on leg(s) 2-3 times a day for 45-60 minutes. If wearing any wraps or hose, do not remove them. Continue exercising as instructed. Elevate legs to the level of the heart or above for 30 minutes daily and/or when sitting for 3-4 times a day throughout the day. Avoid standing for long periods of time. Exercise regularly Compression stocking or Garment 20-30 mm/Hg pressure to: - will place an order assure medi compression stockings from [employer]. [employer]ure medical compression stockings open toe beige thigh with topband 20-30 size XL for both legs. Wound Treatment Wound #6 - Lower Leg Wound Laterality: Right, Medial Cleanser: Soap and Water 1 x Per Week/7 Days Discharge Instructions: May shower and wash wound with dial antibacterial soap and water prior to dressing change. Peri-Wound Care: Sween Lotion (Moisturizing lotion) 1 x Per Week/7 Days Discharge Instructions: Apply moisturizing lotion as directed Prim Dressing: Sorbalgon AG Dressing, 4x4 (in/in) 1 x Per Week/7 Days ary Discharge Instructions: Apply to wound bed as instructed Prim Dressing: Compounding topical antibiotics- [place] 1 x Per Week/7 Days ary Discharge Instructions: apply under alignate Ag when  patient brings with appts. Secondary Dressing: ABD Pad, 5x9 1 x Per Week/7 Days Discharge Instructions: Apply over primary dressing as directed. Secondary Dressing: Woven Gauze Sponge, Non-Sterile 4x4 in 1 x Per Week/7 Days Discharge Instructions: Apply over primary dressing as directed. Compression Wrap: Urgo K2, two layer compression system, regular 1 x Per Week/7 Days Discharge Instructions: Apply Urgo K2 as directed (alternative to 4 layer compression). Wound #7 - Lower Leg Wound Laterality: Left, Circumferential Cleanser: Soap and Water 1 x Per Week/7 Days Discharge Instructions: May shower and wash wound with dial antibacterial soap and water prior to dressing change. Peri-Wound Care: Sween Lotion (Moisturizing lotion) 1 x Per Week/7 Days Discharge Instructions: Apply moisturizing lotion as directed Prim Dressing: Sorbalgon AG Dressing, 4x4 (in/in) 1 x Per Week/7 Days ary Discharge Instructions: Apply to wound bed as instructed Prim Dressing: Compounding topical antibiotics- [place] 1 x Per Week/7 Days ary Discharge Instructions: apply under alignate Ag when patient brings with appts. Secondary Dressing: ABD Pad, 5x9 1 x Per Week/7 Days Discharge Instructions: Apply over primary dressing as directed. Secondary Dressing: Woven Gauze Sponge, Non-Sterile 4x4 in 1 x Per Week/7 Days Discharge Instructions: Apply over primary dressing as directed. Compression Wrap: Urgo K2, two layer compression system, regular 1 x Per Week/7 Days Discharge Instructions: Apply Urgo K2 as directed (alternative to 4 layer compression). [employer] compression stockings - 30-82mg Hg bilateral leg, lifetime need due to chronic lymphedema. - (ICD10 I89.0 - Lymphedema, not elsewhere classified) Patient Medications llergies: cephalexin, mometasone furoate, doxycycline monohydrate A Gilpatrick, Shaquile ([id number] Page 5 of 10 Notifications Medication  Indication Start End 07/02/2022 amoxicillin-pot clavulanate DOSE 1 - oral 875 mg-125 mg tablet - 1 tablet oral twice a day x 14 days. 07/02/2022 levofloxacin DOSE 1 - oral 500 mg tablet - 1 tablet oral once daily x 14 days Electronic Signature(s) Signed: 07/02/2022 5:47:19 PM By: SWorthy KeelerPA-C Signed: 07/02/2022 5:48:24 PM By: DDeon PillingRN, [name] Previous Signature: 07/02/2022 2:49:40 PM Version By: SWorthy KeelerPA-C Entered By: DDeon Pillingon 07/02/2022 16:38:01 Prescription 07/02/2022 -------------------------------------------------------------------------------- Molter, DTerrill MohrPA Patient Name: Provider: 11945/10/[id number]:[id number] of Birth: NPI#:[id number]: DEA #: [phone] #: [place] #: [place]  Patient Address: [place] [address] [place],  [address] [phone] Allergies cephalexin; mometasone furoate; doxycycline monohydrate Provider's Orders compression stockings - ICD10: I89.0 - 30-72mg Hg bilateral leg, lifetime need due to chronic lymphedema. Hand Signature: Date(s): Electronic Signature(s) Signed: 07/02/2022 5:47:19 PM By: SWorthy KeelerPA-C Signed: 07/02/2022 5:48:24 PM By: DDeon PillingRN, [name] Entered By: DDeon Pillingon 07/02/2022 16:38:01 -------------------------------------------------------------------------------- Problem List Details Patient Name: Date of Service: Erman, DA [name] 07/02/2022 1:30 PM Medical Record Number: [id number] Account Number: [id number] of Birth/Sex: Treating RN: 1Dec 29, 1947(77y.o. M) Primary Care Provider: KBirdie RiddleOther Clinician: Referring Provider: Treating Provider/Extender: SCarlynn HeraldWeeks in Treatment: 14 Active Problems ICD-10 Encounter Code Description Active Date MDM Diagnosis I87.333 Chronic venous hypertension (idiopathic)  with ulcer and inflammation of 06/11/2022 No Yes Battiste, Kemauri ([id number] [phone] Page 6 of 10 bilateral lower extremity I89.0 Lymphedema, not elsewhere classified 03/26/2022 No Yes L97.828 Non-pressure chronic ulcer of other part of left lower leg with other specified 03/26/2022 No Yes severity 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 other part of right lower leg with other specified 03/26/2022 No Yes severity Inactive Problems Resolved Problems Electronic Signature(s) Signed: 07/02/2022 2:06:00 PM By: SWorthy KeelerPA-C Entered By: SWorthy Keeleron 07/02/2022 14:06:00 -------------------------------------------------------------------------------- Progress Note Details Patient Name: Date of Service: Ware, DA [name] 07/02/2022 1:30 PM Medical Record Number: [id number] Account Number: [id number] of Birth/Sex: Treating RN: 1November 01, 1947(77y.o. M) Primary Care Provider: KBirdie RiddleOther Clinician: Referring Provider: Treating Provider/Extender: SCarlynn HeraldWeeks in Treatment: 14 Subjective Chief Complaint Information obtained from Patient 02/23/2020; patient is here for wounds on his bilateral lower legs in the setting of severe lymphedema 03/26/2022; patient is here for wounds on his bilateral lower legs medial aspect History of Present Illness (HPI) ADMISSION 02/23/2020 Patient is a 77[place]. He has a history of chronic lymphedema and venous insufficiency in his bilateral lower legs which may have something to do that with having a history of DVT as well as being treated for prostate cancer. In any case he recently got compression pumps at home but compliance has been an issue here. He has compression stockings however they are probably not sufficient enough to control swelling. They tell uKoreathat things deteriorated for him in late August [facility] with cellulitis I think of his bilateral lower legs. Discharge he was noted to have wounds on his bilateral lower legs. He was discharged on Bactrim. [facility]. His wife is been wrapping these applying some form of silver foam dressing. He has a history of wounds before although nothing that would not heal with basic home topical dressings. He has 2 areas on the left medial, left anterior and left lateral and a smaller area on the right medial. All of these have considerable depth. Past medical history includes iron deficiency anemia, [facility], DVT on chronic anticoagulation, prostate cancer, chronic venous insufficiency, hypertension. As mentioned he has compression pumps but does not use them. ABIs in our clinic were noncompressible bilaterally 10/14; patient with severe bilateral lymphedema right greater than left. He came in with bilateral  lower extremity wounds left greater than right. Even though the right side has more of the edema most of the wounds here almost closed on the right medial. He has 3 remaining wounds on the left We have been using silver alginate under 4-layer compression I have been trying to get him to be compliant with his external compression pumps 10/21; patient with 3 small wounds on the left leg and 1 on the right medial in the setting of severe lymphedema and chronic venous insufficiency. We have been using silver alginate under 4-layer compression he is using his external compression pumps twice a day 11/4; ARTERIAL STUDIES on the right show an ABI of 1.02 TBI of 0.858 with biphasic waveforms on the left 0.98 with a TBI of 0.55 and biphasic waveforms. Does not look like he has significant arterial disease. We are treating him for lymphedema he has compression  pumps. He has punched-out areas on the left anterior Peak, Celeste ([id number]) [phone] Page 7 of 10 left lateral and right medial lower extremities 11/11; after we obtained his arterial studies I put him in 4 layer compression. He is using his compression pumps probably once a day although I have asked him to do twice. Primary dressing to the wound is silver collagen he has severe lymphedema likely secondary to chronic venous insufficiency. Wounds on the left lateral, left medial and left anterior and a small area on the right medial 12/2; the area on the right anterior lower leg has healed. We initially thought that the area medially had healed as well however when her discharge nurse came in she detected fluid in the wound simply opened up. This is actually worse than I remember this pain. The area on the left lateral potentially slightly smaller He is also complaining about pain in his left hand he says that this is actually been getting some better he has been using topical creams on this. She asked that I look at this 12/9 after last weeks issues we have 2 wounds one on the right medial lower leg and 1 on the left lateral. Both of these are in the same condition. I think because of thickened skin secondary to chronic lymphedema these wounds actually have depth of almost 0.8 cm. 12/16; the patient has 2 small but deep wounds one on the right medial and one on the left lateral. The right medial is actually the worst of these. He arrives in clinic today with absolutely terrible edema in the right leg apparently his 4-layer wrap fell down to just above his ankle he did not think about this he is apparently been continuing to use his compression pump twice a day. The left leg looks a lot better. 05/09/2020 upon evaluation today patient appears to be doing decently well in regard to his wounds. Everything is measuring smaller the right leg still has a little bit  deeper wound in the left seems to be almost completely healed in my opinion I am very pleased in general with how things are progressing. He has a 4- layer compression wrap we have been using endoform today we will probably have to use collagen just based on the fact that we do not have endoform it is on order. 1/6; the patient's wound on the left lateral lower leg has healed. Still has 1 on the right medial. 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 Using compression pumps at home twice a day. 1/13; left lateral lower leg is still healed. He has a  deep punched out rectangular shaped wound on the right medial calf. Looking down at this it appears that he is attempting to epithelialize around the edges of the wound and on the base as well. His edema is reasonably well controlled we have been using collagen with absolutely no effect 1/20; left lateral lower leg remains closed he has extremitease stockings. The area on the right medial calf I aggressively debrided last week measures larger but the surface looks better. We have been using Hydrofera Blue. We ran Oasis through his insurance but we have not seen the results of this 1/27; left lower leg wound with chronic venous insufficiency and secondary lymphedema. I did aggressive debridement on this last week the wound seems to have come in healthy looking surface using Hydrofera Blue. He was denied for Oasis 2/3; small divot in the right medial lower leg. Under illumination the walls of this divot are epithelialized however the base has slough which I removed with a curette we have been using Hydrofera Blue 2/10 small divot on the right medial lower leg pinpoint illumination at the base of this cone-shaped wound. We have been using Hydrofera Blue but I will switch to calcium alginate this week 2/17; the small divot on the right medial lower leg is fully epithelialized. There is no visible open area under illumination. He has his own  stocking for the right leg similar to the one he has been wearing on the left. 03/26/2022; READMISSION This is a now 77 year old man that we had in the clinic from 02/23/2020 through 07/05/2020. At that point he had bilateral lower extremity wounds left greater than right in the setting of severe lymphedema. He had already obtained compression pumps ordered for him I think from the [facility] care [facility] so I do not really have record of what he has been using. He claims to be using them once a day but there is a problem with the sleeve on the left leg. About 2 weeks ago he was hospitalized from 03/11/2022 through [id number] with diastolic congestive heart failure. 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 He was diuresed. Developed some prerenal azotemia and he has not been taking any diuretics currently. He has not been putting stockings on his legs since he got out of hospital and still has his legs dependent for long periods. Past medical history history of prostate cancer treated with prostatectomy and radiation this was apparently about 8 years ago, history of DVT on chronic Coumadin, history of lymphedema was managed for a while at the clinic in [place]. History of inguinal hernia repair in September 22, hypertension, stage IIIb chronic renal failure ABIs today were noncompressible on the right 1.12 on the left 04-02-2022 upon evaluation today patient appears to be doing well currently in regard to his legs I do feel like both areas that are draining are actually much drier than they were in the picture last week although the left is drier than the right. He is tolerating the 4-layer compression wraps at this point he did contact the pump company and they are actually working on getting him a new compression sleeve for one of his legs which have previously popped and was not functioning properly. 04-23-2022 upon evaluation today  patient appears to be doing well currently in regard to his wounds on the legs. I am actually very pleased with where things stand and I do feel like that we are headed in  the right direction. Fortunately there is no sign of active infection locally or systemically at this time. 05-07-2022 upon evaluation today patient appears to be doing well currently in regard to his wounds in fact things are showing signs of improvement which is good news I do not see too much that actually appears to be open and I am very pleased in that regard. No fevers, chills, nausea, vomiting, or diarrhea. 05-21-2022 upon evaluation today patient appears to be doing somewhat poorly in regard to drainage of his lower extremities bilaterally. The right is greater than left as far as the weeping area. Nonetheless it seems to be getting worse not better. He actually has pitting edema which is at least 2+ to the thighs and I am concerned about the fact that he is may be fluid overloaded in general and that is the reason why we cannot get this under control. I know he is not using his pumps all the time because he actually told the nurse that he was either going to pump or he was going to use his fluid pills but not do both. For that reason I do think that he needs to be really doing both in order to get the fluid out as effectively as possible obviously with the 4-layer compression wraps were doing as much as we can from a compression standpoint but it is really not enough. He tells me that he elevates his leg is much as he can in between pumping and other activity throughout the day. 05-28-2022 upon evaluation today patient appears to be doing better in regard to his wounds although the measurements may be a little bit larger this is a very difficult wound to heal it is very indistinct in a lot of areas. Nonetheless there is can be some need for sharp debridement in regard to both medial and lateral legs. Fortunately I see no signs  of active infection locally nor systemically at this time. No fevers, chills, nausea, vomiting, or diarrhea. 06-04-2022 upon evaluation today patient appears to be doing poorly in general in regard to the wounds on his legs. He still continues to have a tremendous amount of fluid not just in the lower portion of his leg but to be honest his thigh where he has 2-3+ pitting edema in the thigh as well. Unfortunately I do not know that we will be able to get this healed effectively and keep it healed on the lower extremities unless he gets the overall fluid situation taking and under control. Fortunately I do not see any signs of infection locally nor systemically which is great news. He just seems to be very fluid overloaded. 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 He has been tolerating the dressing changes with the compression wraps without complication. Fortunately I do not see any evidence of infection at this time which is great news. No fevers, chills, nausea, vomiting, or diarrhea. 06-18-2022 upon evaluation today patient appears to be doing well currently in regard to his wounds as far as not looking like they are terribly infected but nonetheless I am concerned about a subacute infection secondary to the fact that he continues to have spreading despite the compression therapy. We actually did do an Unna boot on him last week this is actually the first wrap that actually stayed up everything else has been sliding down quite significantly. Fortunately there does not appear to be any signs of infection  systemically at this time. With that being said I do believe that locally there seems to be an issue going on here and again I Georgina Peer do a PCR culture to see what that shows also think that I am going to put him on a broad-spectrum antibiotic, Zawislak, Robbert ([id number])  [phone].pdf Page 8 of 10 doxycycline to see how that will help as well. He does tell me that coming into the clinic today that he was feeling short of breath like "he was about to have a heart attack" because he was having such a hard time breathing. He says that he told this to Dr. Debbora Dus his cardiologist as well when he was evaluated in the past 1 to 2 weeks. 07-02-2022 upon evaluation today patient appears to be doing poorly currently in regard to his wound. He has been tolerating the dressing changes. Unfortunately he has not had any compression wraps on for the past week because he was unable to make it in for his appointment last week. With that being said he has a significant amount of drainage he tells me has been using his pumps but despite this in the pumps he still has been draining quite a bit. The drainage is also somewhat purulent unfortunately. We did attempt to get in touch with his cardiologist last week unfortunately we were unable to get up with him I did advise that the patient needs to get in touch with him upon leaving today in order to make sure they know he is on the new antibiotics I am going to send him this will be Levaquin and Augmentin. Allergies cephalexin (Severity: Moderate), mometasone furoate (Severity: Moderate), doxycycline monohydrate (Reaction: patient said rash to right shoulder.) Objective Constitutional Well-nourished and well-hydrated in no acute distress. Vitals Time Taken: 1:56 PM, Height: 74 in, Weight: 250 lbs, BMI: 32.1, Temperature: 97.7 F, Pulse: 99 bpm, Respiratory Rate: 18 breaths/min, Blood Pressure: 165/62 mmHg. Respiratory normal breathing without difficulty. Psychiatric this patient is able to make decisions and demonstrates good insight into disease process. Alert and Oriented x 3. pleasant and cooperative. General Notes: Upon inspection patient's wound bed actually showed signs of again being significantly  worse. This is more of a venous issue and unfortunately he has a tremendous amount of drainage noted which is somewhat purulent in nature. I think we need to get the infection under control he did not have his Keystone antibiotics with him today it has been delivered yesterday but they gave it to his son who has apparently the same name did not realize that it was he has he tells me. Nonetheless I think he needs to get that we probably need to put it on tomorrow. Integumentary (Hair, Skin) Wound #6 status is Open. Original cause of wound was Gradually Appeared. The date acquired was: 03/05/2022. The wound has been in treatment 14 weeks. The wound is located on the Right,Medial Lower Leg. The wound measures 15.5cm length x 22cm width x 0.1cm depth; 267.821cm^2 area and 26.782cm^3 volume. There is Fat Layer (Subcutaneous Tissue) exposed. There is no tunneling or undermining noted. There is a large amount of purulent drainage noted. The wound margin is distinct with the outline attached to the wound base. There is no granulation within the wound bed. There is a large (67-100%) amount of necrotic tissue within the wound bed including Adherent Slough. The periwound skin appearance exhibited: Maceration. The periwound skin appearance did not exhibit: Callus, Crepitus, Excoriation, Induration, Rash, Scarring, Dry/Scaly, Atrophie Blanche, Cyanosis, Ecchymosis, Hemosiderin Staining,  Mottled, Pallor, Rubor, Erythema. Wound #7 status is Open. Original cause of wound was Gradually Appeared. The date acquired was: 03/05/2022. The wound has been in treatment 14 weeks. The wound is located on the Left,Circumferential Lower Leg. The wound measures 8.5cm length x 50cm width x 0.1cm depth; 333.794cm^2 area and 33.379cm^3 volume. There is Fat Layer (Subcutaneous Tissue) exposed. There is no tunneling or undermining noted. There is a large amount of purulent drainage noted. The wound margin is distinct with the outline  attached to the wound base. There is medium (34-66%) red, pink granulation within the wound bed. There is a medium (34-66%) amount of necrotic tissue within the wound bed including Adherent Slough. The periwound skin appearance exhibited: Maceration. The periwound skin appearance did not exhibit: Callus, Crepitus, Excoriation, Induration, Rash, Scarring, Dry/Scaly, Atrophie Blanche, Cyanosis, Ecchymosis, Hemosiderin Staining, Mottled, Pallor, Rubor, Erythema. 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 There was a Double Layer Compression Therapy Procedure by Deon Pilling, RN. Post procedure Diagnosis Wound #6: Same as Pre-Procedure Wound #7 Stolar, Martavious ([id number] Page 9 of 10 Pre-procedure diagnosis of Wound #7 is a Lymphedema located on the Left,Circumferential Lower Leg . There was a Double Layer Compression Therapy Procedure by Deon Pilling, RN. Post procedure Diagnosis Wound #7: Same as Pre-Procedure Plan Follow-up Appointments: Return Appointment in 1 week. - Lanae Crumbly Wednesdays 07/09/2022 1245pm Return Appointment in 2 weeks. - Lanae Crumbly Wednesdays 07/16/2022 215pm Nurse Visit: - 245pm Dr. Heber Mullin side *****Margretta [name] IN TOPICAL ANTIBIOTICS.***** Other: - Will call you cardiologist to make aware of your shortness of breath with exertion, fluid overload in both legs, and elevated blood pressure. ****Go pick up oral antibiotic from your pharmacy.**** New antibiotics levaquin and Augmentin start that today.***** ****Please bring  the topical antibiotics at each visit.**** Bathing/ Shower/ Hygiene: May shower with protection but do not get wound dressing(s) wet. Protect dressing(s) with water repellant cover (for example, large plastic bag) or a cast cover and may then take shower. Edema Control - Lymphedema / SCD / Other: Lymphedema Pumps. Use Lymphedema pumps on leg(s) 2-3 times a day for 45-60 minutes. If wearing any wraps or hose, do not remove them. Continue exercising as instructed. Elevate legs to the level of the heart or above for 30 minutes daily and/or when sitting for 3-4 times a day throughout the day. Avoid standing for long periods of time. Exercise regularly Compression stocking or Garment 20-30 mm/Hg pressure to: - will place an order assure medi compression stockings from [employer]. Assure medical compression stockings open toe beige thigh with topband 20-30 size XL for both legs. The following medication(s) was prescribed: amoxicillin-pot clavulanate oral 875 mg-125 mg tablet 1 1 tablet oral twice a day x 14 days. starting 07/02/2022 levofloxacin oral 500 mg tablet 1 1 tablet oral once daily x 14 days starting 07/02/2022 WOUND #6: - Lower Leg Wound Laterality: Right, Medial Cleanser: Soap and Water 1 x Per Week/7 Days Discharge Instructions: May shower and wash wound with dial antibacterial soap and water prior to dressing change. Peri-Wound Care: Sween Lotion (Moisturizing lotion) 1 x Per Week/7 Days Discharge Instructions: Apply moisturizing lotion as directed Prim  Dressing: Sorbalgon AG Dressing, 4x4 (in/in) 1 x Per Week/7 Days ary Discharge Instructions: Apply to wound bed as instructed Prim Dressing: Compounding topical antibiotics- [place] 1 x Per Week/7 Days ary Discharge Instructions: apply under alignate Ag when patient brings with appts. Secondary Dressing: ABD Pad, 5x9 1 x Per Week/7 Days Discharge Instructions: Apply over primary dressing as directed. Secondary Dressing: Woven  Gauze Sponge, Non-Sterile 4x4 in 1 x Per Week/7 Days Discharge Instructions: Apply over primary dressing as directed. Com pression Wrap: Urgo K2, two layer compression system, regular 1 x Per Week/7 Days Discharge Instructions: Apply Urgo K2 as directed (alternative to 4 layer compression). WOUND #7: - Lower Leg Wound Laterality: Left, Circumferential Cleanser: Soap and Water 1 x Per Week/7 Days Discharge Instructions: May shower and wash wound with dial antibacterial soap and water prior to dressing change. Peri-Wound Care: Sween Lotion (Moisturizing lotion) 1 x Per Week/7 Days Discharge Instructions: Apply moisturizing lotion as directed Prim Dressing: Sorbalgon AG Dressing, 4x4 (in/in) 1 x Per Week/7 Days ary Discharge Instructions: Apply to wound bed as instructed Prim Dressing: Compounding topical antibiotics- [place] 1 x Per Week/7 Days ary Discharge Instructions: apply under alignate Ag when patient brings with appts. Secondary Dressing: ABD Pad, 5x9 1 x Per Week/7 Days Discharge Instructions: Apply over primary dressing as directed. Secondary Dressing: Woven Gauze Sponge, Non-Sterile 4x4 in 1 x Per Week/7 Days Discharge Instructions: Apply over primary dressing as directed. Com pression Wrap: Urgo K2, two layer compression system, regular 1 x Per Week/7 Days Discharge Instructions: Apply Urgo K2 as directed (alternative to 4 layer compression). 1. Based on what I am seeing I do believe that the patient would benefit from topical use of the [facility] antibiotics which we have gotten for him is actually at his house or at least that his son's house they thought it was his but it is actually the antibiotics that we need. Nonetheless on having come in tomorrow to rewrap him and bring the topical antibiotics with him. 2 also can recommend that we have the patient go ahead and pick up the new oral medications. The doxycycline he states may have caused a rash on his shoulder. For  that reason we will going to avoid the Doxy for now, to send in a prescription for Augmentin and Levaquin and we will see how things go with that. He is in agreement with the plan. 3. I am also going to recommend that the patient should continue to monitor for any signs of infection or worsening. This would indicate a need to potentially go to the ER for further evaluation and treatment I discussed that with him today. Subsequently we will see how things stand at follow-up in 1 week's time. We will see patient back for reevaluation in 1 week here in the clinic. If anything worsens or changes patient will contact our office for additional recommendations. Electronic Signature(s) Signed: 07/02/2022 2:50:11 PM By: Worthy Keeler PA-C Entered By: Worthy Keeler on 07/02/2022 14:50:11 Soper, Kasandra [name] ([id number] Page 10 of 10 -------------------------------------------------------------------------------- SuperBill Details Patient Name: Date of Service: Hoskinson, [name] 07/02/2022 Medical Record Number: [id number] Patient Account Number: [id number] Date of Birth/Sex: Treating RN: 03-08-1946 (77 y.o. [name] Primary Care Provider: [name] Other Clinician: Referring Provider: Treating Provider/Extender: [name] Weeks in Treatment: 14 Diagnosis Coding ICD-10 Codes Code Description [id number] Chronic venous hypertension (idiopathic) with ulcer and inflammation of bilateral lower extremity I89.0 Lymphedema, not elsewhere classified  [id number] Non-pressure chronic ulcer of other part of left lower leg with other specified severity 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 other part of right lower leg with other specified severity Facility Procedures : CPT4: Code [id number] Description: 81 BILATERAL: Application of multi-layer venous compression system; leg (below knee), including ankle and t. Modifier: Quantity:  1 Physician Procedures : CPT4 Code Description Modifier [id number] - WC PHYS LEVEL 4 - EST PT ICD-10 Diagnosis Description I87.333 Chronic venous hypertension (idiopathic) with ulcer and inflammation of bilateral lower extremity I89.0 Lymphedema, not elsewhere classified  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 other part of right lower leg with other specified severity Quantity: 1 Electronic Signature(s) Signed: 07/02/2022 2:50:30 PM By: Worthy Keeler PA-C Entered By: Worthy Keeler on 07/02/2022 14:50:30

## 2022-07-05 NOTE — Progress Notes (Signed)
[name], [name] ([id number].pdf Page 1 of 9 Visit Report for 07/02/2022 Allergy List Details Patient Name: Date of Service: [name] 07/02/2022 1:30 PM Medical Record Number: [id number] Patient Account Number: [id number] Date of Birth/Sex: Treating RN: 1945/06/05 (77 y.o. [name] Primary Care Leigh Blas: [name] Other Clinician: Referring Amorina Doerr: Treating Jaelen Soth/Extender: [name] Weeks in Treatment: 14 Allergies Active Allergies cephalexin Severity: Moderate mometasone furoate Severity: Moderate doxycycline monohydrate Reaction: patient said rash to right shoulder. Allergy Notes Electronic Signature(s) Signed: 07/02/2022 5:48:24 PM By: [name], [name] Entered By: Deon Pilling on 07/02/2022 14:35:21 -------------------------------------------------------------------------------- Arrival Information Details Patient Name: Date of Service: [name] 07/02/2022 1:30 PM Medical Record Number: [id number] Patient Account Number: [id number] Date of Birth/Sex: Treating RN: 17-May-1946 (77 y.o. M) Primary Care Harsh Trulock: [name] Other Clinician: Referring Ermelinda Eckert: Treating Victoriya Pol/Extender: [name] in Treatment: 14 Visit Information Patient Arrived: Cane Arrival Time: 13:55 Accompanied By: wife Transfer Assistance: None Patient Identification Verified: Yes Secondary Verification Process Completed: Yes Patient Requires Transmission-Based Precautions: No Patient Has Alerts: Yes Patient Alerts: Patient on Blood Thinner Right ABI in clinic [place] History Since Last Visit Added or deleted any medications: No Any new allergies or adverse reactions: No Had a fall or experienced change in activities of daily living that may affect risk of falls: No Signs or symptoms of abuse/neglect since last visito No Hospitalized since last visit: No Implantable device  outside of the clinic excluding cellular tissue based products placed in the center since last visit: No Notes patient notes he has not received his topical compounding antibiotics from [facility]. Per patient did not call [facility] to follow up. Per wife a small box arrived at home and gave it to their son. Phoned [facility] and spoke with pharmacy staff and medication was delivered yesterday. Electronic Signature(s) Signed: 07/02/2022 5:48:24 PM By: [name], [name] Carta, [name] ([id number]) PM By: [name], [name] [phone].pdf Page 2 of 9 Signed: 07/02/2022 5:48:24 Entered By: Deon Pilling on 07/02/2022 14:05:10 -------------------------------------------------------------------------------- Compression Therapy Details Patient Name: Date of Service: [name] 07/02/2022 1:30 PM Medical Record Number: [id number] Patient Account Number: [id number] Date of Birth/Sex: Treating RN: Dec 13, 1945 (77 y.o. [name] Primary Care Jullia Mulligan: [name] Other Clinician: Referring Shalissa Easterwood: Treating Jozi Malachi/Extender: [name] Weeks in Treatment: 14 Compression Therapy Performed for Wound Assessment: Wound #6 Right,Medial Lower Leg Performed By: Clinician Deon Pilling, RN Compression Type: Double Layer Post Procedure Diagnosis Same as Pre-procedure Electronic Signature(s) Signed: 07/02/2022 5:48:24 PM By: [name], [name] Entered By: Deon Pilling on 07/02/2022 14:41:24 -------------------------------------------------------------------------------- Compression Therapy Details Patient Name: Date of Service: [name] 07/02/2022 1:30 PM Medical Record Number: [id number] Patient Account Number: [id number] Date of Birth/Sex: Treating RN: [birth year] (77 y.o. [name] Primary Care Desi Carby: [name] Other Clinician: Referring Thaddus Mcdowell: Treating Lenna Hagarty/Extender: [name] Weeks in Treatment: 14 Compression Therapy Performed for Wound Assessment: Wound #7 Left,Circumferential Lower Leg Performed By: Clinician Deon Pilling, RN Compression Type: Double Layer Post Procedure Diagnosis Same as Pre-procedure Electronic Signature(s) Signed: 07/02/2022 5:48:24 PM By: [name], [name] Entered By: Deon Pilling on 07/02/2022 14:41:24 -------------------------------------------------------------------------------- Encounter Discharge Information Details Patient Name: Date of Service: [name] 07/02/2022 1:30 PM Medical Record Number: [id number] Patient Account Number: [id number] Date of Birth/Sex: Treating RN: 1946-03-28 (77 y.o. [name] Primary Care Keelyn Fjelstad: [name]  Other Clinician: Referring Presleigh Feldstein: Treating Caylan Schifano/Extender: [name], Benjaman Kindler in Treatment: 14 Keatts, [name] ([id number]) [id number] Page 3 of 9 Encounter Discharge Information Items Discharge Condition: Stable Ambulatory Status: Cane Discharge Destination: Home Transportation: Private Auto Accompanied By: wife Schedule Follow-up Appointment: Yes Clinical Summary of Care: Electronic Signature(s) Signed: 07/02/2022 5:48:24 PM By: [name], [name] Entered By: Deon Pilling on 07/02/2022 14:41:57 -------------------------------------------------------------------------------- Lower Extremity Assessment Details Patient Name: Date of Service: [name] 07/02/2022 1:30 PM Medical Record Number: [id number] Patient Account Number: [id number] Date of Birth/Sex: Treating RN: [birth year] (77 y.o. M) Primary Care Tiphany Fayson: [name] Other Clinician: Referring Tashiya [name]: Treating Adler Chartrand/Extender: [name], [name] Weeks in Treatment: 14 Edema Assessment Assessed: [Left: Yes] [Right: Yes] Edema: [Left: Yes] [Right: Yes] Calf Left: Right: Point of Measurement: 40 cm From Medial  Instep 59.5 cm 67 cm Ankle Left: Right: Point of Measurement: 13 cm From Medial Instep 44 cm 45.5 cm Vascular Assessment Pulses: Dorsalis Pedis Palpable: [Left:Yes] [Right:Yes] Electronic Signature(s) Signed: 07/04/2022 12:03:25 PM By: Erenest Blank Entered By: Erenest Blank on 07/02/2022 14:09:19 -------------------------------------------------------------------------------- Multi-Disciplinary Care Plan Details Patient Name: Date of Service: Merriwether, DA [name] 07/02/2022 1:30 PM Medical Record Number: [id number] Patient Account Number: [id number] Date of Birth/Sex: Treating RN: Oct 25, 1945 (77 y.o. [name] Primary Care Teisha Trowbridge: [name] Other Clinician: Referring Ifeanyichukwu [name]: Treating Emmalynn Pinkham/Extender: [name] in Treatment: 14 Soland, [name] ([id number]) [id number] Page 4 of 9 Active Inactive Wound/Skin Impairment Nursing Diagnoses: Impaired tissue integrity Knowledge deficit related to ulceration/compromised skin integrity Goals: Patient will have a decrease in wound volume by X% from date: (specify in notes) Date Initiated: 03/26/2022 Target Resolution Date: 07/18/2022 Goal Status: Active Patient/caregiver will verbalize understanding of skin care regimen Date Initiated: 03/26/2022 Target Resolution Date: 07/18/2022 Goal Status: Active Ulcer/skin breakdown will have a volume reduction of 30% by week 4 Date Initiated: 03/26/2022 Date Inactivated: 05/21/2022 Target Resolution Date: 05/17/2022 Unmet Reason: see wound Goal Status: Unmet measurement. Ulcer/skin breakdown will have a volume reduction of 50% by week 8 Date Initiated: 03/26/2022 Date Inactivated: 05/21/2022 Target Resolution Date: 05/17/2022 Unmet Reason: see wound Goal Status: Unmet measurement. Interventions: Assess patient/caregiver ability to obtain necessary supplies Assess patient/caregiver ability to perform ulcer/skin care regimen upon  admission and as needed Assess ulceration(s) every visit Notes: Patient stated today, "I will take my fluid pill or pump not do both." Ashea Winiarski made aware. Electronic Signature(s) Signed: 07/02/2022 5:48:24 PM By: [name], [name] Entered By: Deon Pilling on 07/02/2022 14:40:44 -------------------------------------------------------------------------------- Pain Assessment Details Patient Name: Date of Service: Shovlin, DA [name] 07/02/2022 1:30 PM Medical Record Number: [id number] Patient Account Number: [id number] Date of Birth/Sex: Treating RN: 1946-01-01 (77 y.o. M) Primary Care Edell Mesenbrink: [name] Other Clinician: Referring Shrihan Putt: Treating Daune Colgate/Extender: [name] Weeks in Treatment: 14 Active Problems Location of Pain Severity and Description of Pain Patient Has Paino No Site Locations [place], [facility] ([id number]) [id number] Page 5 of 9 Pain Management and Medication Current Pain Management: Electronic Signature(s) Signed: 07/04/2022 12:03:25 PM By: Erenest Blank Entered By: Erenest Blank on 07/02/2022 14:00:28 -------------------------------------------------------------------------------- Patient/Caregiver Education Details Patient Name: Date of Service: Scarpelli, DA [name] 2/14/2024andnbsp1:30 PM Medical Record Number: [id number] Patient Account Number: [id number] Date of Birth/Gender: Treating RN: 1945/07/17 (77 y.o. [name] Primary Care Physician: [name] Other Clinician: Referring Physician: Treating Physician/Extender: [name] in Treatment: 14 Education Assessment Education Provided To: Patient  and Caregiver Education Topics Provided Venous: Handouts: Controlling Swelling with Multilayered Compression Wraps, Managing Venous Insufficiency Methods: Explain/Verbal Responses: Reinforcements needed Electronic Signature(s) Signed: 07/02/2022 5:48:24 PM By:  [name], [name] Entered By: Deon Pilling on 07/02/2022 14:41:01 -------------------------------------------------------------------------------- Wound Assessment Details Patient Name: Date of Service: Ventura, DA [name] 07/02/2022 1:30 PM Medical Record Number: [id number] Patient Account Number: [id number] Date of Birth/Sex: Treating RN: January 30, 1946 (77 y.o. M) Primary Care Laelah Siravo: [name] Other Clinician: Referring Alessia Gonsalez: Treating Marice Angelino/Extender: [name] Weeks in Treatment: 14 Wound Status Wound Number: 6 Primary Lymphedema Etiology: Wound Location: Right, Medial Lower Leg Wound Open Wounding Event: Gradually Appeared Status: Date Acquired: 03/05/2022 Comorbid Anemia, Lymphedema, Deep Vein Thrombosis, Hypertension, Weeks Of Treatment: 14 History: Received Radiation Clustered Wound: Yes Photos Janco, [name] ([id number] Page 6 of 9 Wound Measurements Length: (cm) 15.5 Width: (cm) 22 Depth: (cm) 0.1 Clustered Quantity: 1 Area: (cm) 267.821 Volume: (cm) 26.782 % Reduction in Area: -87.4% % Reduction in Volume: -87.4% Epithelialization: Small (1-33%) Tunneling: No Undermining: No Wound Description Classification: Full Thickness Without Exposed Sup Wound Margin: Distinct, outline attached Exudate Amount: Large Exudate Type: Purulent Exudate Color: yellow, brown, green port Structures Foul Odor After Cleansing: No Slough/Fibrino Yes Wound Bed Granulation Amount: None Present (0%) Exposed Structure Necrotic Amount: Large (67-100%) Fascia Exposed: No Necrotic Quality: Adherent Slough Fat Layer (Subcutaneous Tissue) Exposed: Yes Tendon Exposed: No Muscle Exposed: No Joint Exposed: No Bone Exposed: No Periwound Skin Texture Texture Color No Abnormalities Noted: No No Abnormalities Noted: No Callus: No Atrophie Blanche: No Crepitus: No Cyanosis: No Excoriation: No Ecchymosis:  No Induration: No Erythema: No Rash: No Hemosiderin Staining: No Scarring: No Mottled: No Pallor: No Moisture Rubor: No No Abnormalities Noted: No Dry / Scaly: No Maceration: Yes Treatment Notes Wound #6 (Lower Leg) Wound Laterality: Right, Medial Cleanser Soap and Water Discharge Instruction: May shower and wash wound with dial antibacterial soap and water prior to dressing change. Peri-Wound Care Sween Lotion (Moisturizing lotion) Discharge Instruction: Apply moisturizing lotion as directed Topical Primary Dressing Sorbalgon AG Dressing, 4x4 (in/in) Discharge Instruction: Apply to wound bed as instructed Compounding topical antibiotics- [facility] Discharge Instruction: apply under alignate Ag when patient brings with appts. Secondary Dressing ABD Pad, 5x9 Discharge Instruction: Apply over primary dressing as directed. Osterberg, [name] ([id number] Page 7 of 9 Woven Gauze Sponge, Non-Sterile 4x4 in Discharge Instruction: Apply over primary dressing as directed. Secured With Compression Wrap Urgo K2, two layer compression system, regular Discharge Instruction: Apply Urgo K2 as directed (alternative to 4 layer compression). Compression Stockings Add-Ons Electronic Signature(s) Signed: 07/02/2022 5:48:24 PM By: [name], [name] Entered By: Deon Pilling on 07/02/2022 14:10:45 -------------------------------------------------------------------------------- Wound Assessment Details Patient Name: Date of Service: Wolfson, DA [name] 07/02/2022 1:30 PM Medical Record Number: [id number] Patient Account Number: [id number] Date of Birth/Sex: Treating RN: [birth year] (77 y.o. M) Primary Care Bryley Chrisman: [name] Other Clinician: Referring Solan Vosler: Treating Stevenson Windmiller/Extender: [name], [name] Weeks in Treatment: 14 Wound Status Wound Number: 7 Primary Lymphedema Etiology: Wound Location: Left, Circumferential  Lower Leg Wound Open Wounding Event: Gradually Appeared Status: Date Acquired: 03/05/2022 Comorbid Anemia, Lymphedema, Deep Vein Thrombosis, Hypertension, Weeks Of Treatment: 14 History: Received Radiation Clustered Wound: Yes Photos Wound Measurements Length: (cm) 8 Width: (cm) 5 Depth: (cm) 0 Clustered Quantity: 6 Area: (cm) Volume: (cm) .5 % Reduction in Area: -608.3% 0 % Reduction in Volume: -608.4% .1 Epithelialization: Medium (34-66%) Tunneling: No 333.794 Undermining: No 33.379 Wound  Description Classification: Full Thickness Without Exposed Sup Wound Margin: Distinct, outline attached Exudate Amount: Large Exudate Type: Purulent Exudate Color: yellow, brown, green port Structures Foul Odor After Cleansing: No Slough/Fibrino Yes Wound Bed Granulation Amount: Medium (34-66%) Exposed Structure Granulation Quality: Red, Pink Fascia Exposed: No Slomski, Kadarrius ([id number] Page 8 of 9 Necrotic Amount: Medium (34-66%) Fat Layer (Subcutaneous Tissue) Exposed: Yes Necrotic Quality: Adherent Slough Tendon Exposed: No Muscle Exposed: No Joint Exposed: No Bone Exposed: No Periwound Skin Texture Texture Color No Abnormalities Noted: No No Abnormalities Noted: No Callus: No Atrophie Blanche: No Crepitus: No Cyanosis: No Excoriation: No Ecchymosis: No Induration: No Erythema: No Rash: No Hemosiderin Staining: No Scarring: No Mottled: No Pallor: No Moisture Rubor: No No Abnormalities Noted: No Dry / Scaly: No Maceration: Yes Treatment Notes Wound #7 (Lower Leg) Wound Laterality: Left, Circumferential Cleanser Soap and Water Discharge Instruction: May shower and wash wound with dial antibacterial soap and water prior to dressing change. Peri-Wound Care Sween Lotion (Moisturizing lotion) Discharge Instruction: Apply moisturizing lotion as directed Topical Primary Dressing Sorbalgon AG Dressing, 4x4  (in/in) Discharge Instruction: Apply to wound bed as instructed Compounding topical antibiotics- [facility] Discharge Instruction: apply under alignate Ag when patient brings with appts. Secondary Dressing ABD Pad, 5x9 Discharge Instruction: Apply over primary dressing as directed. Woven Gauze Sponge, Non-Sterile 4x4 in Discharge Instruction: Apply over primary dressing as directed. Secured With Compression Wrap Urgo K2, two layer compression system, regular Discharge Instruction: Apply Urgo K2 as directed (alternative to 4 layer compression). Compression Stockings Add-Ons Electronic Signature(s) Signed: 07/02/2022 5:48:24 PM By: [name], [name] Entered By: Deon Pilling on 07/02/2022 14:11:57 -------------------------------------------------------------------------------- Vitals Details Patient Name: Date of Service: Hinks, DA [name] 07/02/2022 1:30 PM Medical Record Number: [id number] Patient Account Number: [id number] Date of Birth/Sex: Treating RN: [birth year] (77 y.o. M) Primary Care Miracle Criado: [name] Other Clinician: Referring Aliyyah Riese: Treating Deke Tilghman/Extender: [name], [name] Bazile, [name] ([id number]) [id number] Page 9 of 9 Weeks in Treatment: 14 Vital Signs Time Taken: 13:56 Temperature (F): 97.7 Height (in): 74 Pulse (bpm): 99 Weight (lbs): 250 Respiratory Rate (breaths/min): 18 Body Mass Index (BMI): 32.1 Blood Pressure (mmHg): 165/62 Reference Range: 80 - 120 mg / dl Electronic Signature(s) Signed: 07/04/2022 12:03:25 PM By: Erenest Blank Entered By: Erenest Blank on 07/02/2022 14:00:19

## 2022-07-05 NOTE — Progress Notes (Signed)
[name] ([id number].pdf Page 1 of 5 Visit Report for 07/03/2022 Arrival Information Details Patient Name: Date of Service: [name], [name] 07/03/2022 2:45 PM Medical Record Number: [id number] Patient Account Number: [id number] Date of Birth/Sex: Treating RN: 21-Jul-1945 (77 y.o. [name] Primary Care [name]: [name] Other Clinician: Referring [name]: Treating [name]/Extender: [name]: 14 Visit Information History Since Last Visit Added or deleted any medications: No Patient Arrived: Ambulatory Any new allergies or adverse reactions: No Arrival Time: 15:21 Had a fall or experienced change in No Accompanied By: self activities of daily living that may affect Transfer Assistance: Manual risk of falls: Patient Identification Verified: Yes Signs or symptoms of abuse/neglect since last visito No Secondary Verification Process Completed: Yes Hospitalized since last visit: No Patient Requires Transmission-Based Precautions: No Implantable device outside of the clinic excluding No Patient Has Alerts: Yes cellular tissue based products placed in the center Patient Alerts: Patient on Blood Thinner since last visit: Right ABI in clinic [place] Has Dressing in Place as Prescribed: Yes Has Compression in Place as Prescribed: Yes Pain Present Now: No Electronic Signature(s) Signed: 07/04/2022 12:08:46 PM By: Rhae Hammock RN Entered By: Rhae Hammock on 07/03/2022 15:21:16 -------------------------------------------------------------------------------- Compression Therapy Details Patient Name: Date of Service: [name] 07/03/2022 2:45 PM Medical Record Number: [id number] Patient Account Number: [id number] Date of Birth/Sex: Treating RN: 1946/03/27 (77 y.o. [name] Primary Care [name]: [name] Other Clinician: Referring Adyn Hoes: Treating  Nashley Cordoba/Extender: [name]: 14 Compression Therapy Performed for Wound Assessment: Wound #6 Right,Medial Lower Leg Performed By: Clinician Rhae Hammock, RN Compression Type: Four Layer Electronic Signature(s) Signed: 07/04/2022 12:08:46 PM By: Rhae Hammock RN Entered By: Rhae Hammock on 07/03/2022 14:39:03 -------------------------------------------------------------------------------- Compression Therapy Details Patient Name: Date of Service: [name], [name] 07/03/2022 2:45 PM Medical Record Number: [id number] Patient Account Number: [id number] [name], [name] ([id number]) [phone].pdf Page 2 of 5 Date of Birth/Sex: Treating RN: December 22, 1945 (77 y.o. [name] Primary Care [name]: Other Clinician: [name] Referring [name]: Treating Cecilio Ohlrich/Extender: [name]: 14 Compression Therapy Performed for Wound Assessment: Wound #7 Left,Circumferential Lower Leg Performed By: Clinician Rhae Hammock, RN Compression Type: Four Layer Electronic Signature(s) Signed: 07/04/2022 12:08:46 PM By: Rhae Hammock RN Entered By: Rhae Hammock on 07/03/2022 14:39:03 -------------------------------------------------------------------------------- Encounter Discharge Information Details Patient Name: Date of Service: [name], [name] 07/03/2022 2:45 PM Medical Record Number: [id number] Patient Account Number: [id number] Date of Birth/Sex: Treating RN: 09-Jun-1945 (77 y.o. [name] Primary Care [name]: [name] Other Clinician: Referring Tamia Dial: Treating [name]: 108 Encounter Discharge Information Items Discharge Condition: Stable Ambulatory Status: Ambulatory Discharge Destination: Home Transportation: Private Auto Accompanied By: self Schedule Follow-up  Appointment: Yes Clinical Summary of Care: Patient Declined Electronic Signature(s) Signed: 07/04/2022 12:08:46 PM By: Rhae Hammock RN Entered By: Rhae Hammock on 07/03/2022 15:21:51 -------------------------------------------------------------------------------- Patient/Caregiver Education Details Patient Name: Date of Service: [name] 2/15/2024andnbsp2:45 PM Medical Record Number: [id number] Patient Account Number: [id number] Date of Birth/Gender: Treating RN: 09-21-1945 (77 y.o. [name] Primary Care Physician: [name] Other Clinician: Referring Physician: Treating Physician/Extender: [name] in Treatment: 14 Education Assessment Education Provided To: Patient Education Topics Provided Wound/Skin Impairment: Methods: Explain/Verbal Responses: Reinforcements needed, State content correctly [employer]) [place], [name] ([id number]) [id number] Page 3 of 5 Signed: 07/04/2022 12:08:46 PM By: Rhae Hammock  RN Entered By: Rhae Hammock on 07/03/2022 15:21:40 -------------------------------------------------------------------------------- Wound Assessment Details Patient Name: Date of Service: [name], [name] 07/03/2022 2:45 PM Medical Record Number: [id number] Patient Account Number: [id number] Date of Birth/Sex: Treating RN: [birth year] (77 y.o. [name] Primary Care Lilybeth Vien: [name] Other Clinician: Referring Shakeitha Umbaugh: Treating Vaughn Frieze/Extender: [name]: 14 Wound Status Wound Number: 6 Primary Etiology: Lymphedema Wound Location: Right, Medial Lower Leg Wound Status: Open Wounding Event: Gradually Appeared Date Acquired: 03/05/2022 Weeks Of Treatment: 14 Clustered Wound: Yes Wound Measurements Length: (cm) 15.5 Width: (cm) 22 Depth: (cm) 0.1 Area: (cm) 267.821 Volume: (cm) 26.782 %  Reduction in Area: -87.4% % Reduction in Volume: -87.4% Wound Description Classification: Full Thickness Without Exposed Suppor Exudate Amount: Large Exudate Type: Purulent Exudate Color: yellow, brown, green t Structures Periwound Skin Texture Texture Color No Abnormalities Noted: No No Abnormalities Noted: No Moisture No Abnormalities Noted: No Treatment Notes Wound #6 (Lower Leg) Wound Laterality: Right, Medial Cleanser Soap and Water Discharge Instruction: May shower and wash wound with dial antibacterial soap and water prior to dressing change. Peri-Wound Care Sween Lotion (Moisturizing lotion) Discharge Instruction: Apply moisturizing lotion as directed Topical Primary Dressing Sorbalgon AG Dressing, 4x4 (in/in) Discharge Instruction: Apply to wound bed as instructed Compounding topical antibiotics- [facility] Discharge Instruction: apply under alignate Ag when patient brings with appts. Secondary Dressing ABD Pad, 5x9 Discharge Instruction: Apply over primary dressing as directed. Woven Gauze Sponge, Non-Sterile 4x4 in Discharge Instruction: Apply over primary dressing as directed. Fabro, [name] ([id number].pdf Page 4 of 5 Secured With Compression Wrap Urgo K2, two layer compression system, regular Discharge Instruction: Apply Urgo K2 as directed (alternative to 4 layer compression). Compression Stockings Add-Ons Electronic Signature(s) Signed: 07/04/2022 12:08:46 PM By: Rhae Hammock RN Entered By: Rhae Hammock on 07/03/2022 14:38:50 -------------------------------------------------------------------------------- Wound Assessment Details Patient Name: Date of Service: [name], [name] 07/03/2022 2:45 PM Medical Record Number: [id number] Patient Account Number: [id number] Date of Birth/Sex: Treating RN: 02-18-1946 (77 y.o. [name] Primary Care Kennedie Pardoe: [name] Other Clinician: Referring  Burton Gahan: Treating Deni Berti/Extender: [name]: 14 Wound Status Wound Number: 7 Primary Etiology: Lymphedema Wound Location: Left, Circumferential Lower Leg Wound Status: Open Wounding Event: Gradually Appeared Date Acquired: 03/05/2022 Weeks Of Treatment: 14 Clustered Wound: Yes Wound Measurements Length: (cm) 8.5 Width: (cm) 50 Depth: (cm) 0.1 Area: (cm) 333.794 Volume: (cm) 33.379 % Reduction in Area: -608.3% % Reduction in Volume: -608.4% Wound Description Classification: Full Thickness Without Exposed Suppor Exudate Amount: Large Exudate Type: Purulent Exudate Color: yellow, brown, green t Structures Periwound Skin Texture Texture Color No Abnormalities Noted: No No Abnormalities Noted: No Moisture No Abnormalities Noted: No Treatment Notes Wound #7 (Lower Leg) Wound Laterality: Left, Circumferential Cleanser Soap and Water Discharge Instruction: May shower and wash wound with dial antibacterial soap and water prior to dressing change. Peri-Wound Care Sween Lotion (Moisturizing lotion) Discharge Instruction: Apply moisturizing lotion as directed Topical Primary Dressing [place], 4x4 (in/in) [place], Dwyane ([id number]) [id number].pdf Page 5 of 5 Discharge Instruction: Apply to wound bed as instructed Compounding topical antibiotics- [facility] Discharge Instruction: apply under alignate Ag when patient brings with appts. Secondary Dressing ABD Pad, 5x9 Discharge Instruction: Apply over primary dressing as directed. Woven Gauze Sponge, Non-Sterile 4x4 in Discharge Instruction: Apply over primary dressing as directed. Secured With Compression Wrap Urgo K2, two layer compression system, regular Discharge Instruction: Apply Urgo K2 as directed (alternative to 4  layer compression). Compression Stockings Add-Ons Electronic Signature(s) Signed: 07/04/2022 12:08:46 PM By:  Rhae Hammock RN Entered By: Rhae Hammock on 07/03/2022 14:38:50 -------------------------------------------------------------------------------- Vitals Details Patient Name: Date of Service: [name], [name] 07/03/2022 2:45 PM Medical Record Number: [id number] Patient Account Number: [id number] Date of Birth/Sex: Treating RN: 1945-12-28 (77 y.o. [name] Primary Care Sharanya Templin: [name] Other Clinician: Referring Isauro Skelley: Treating Armonii Sieh/Extender: [name]: 14 Vital Signs Time Taken: 15:21 Reference Range: 80 - 120 mg / dl Height (in): 74 Weight (lbs): 250 Body Mass Index (BMI): 32.1 Electronic Signature(s) Signed: 07/04/2022 12:08:46 PM By: Rhae Hammock RN Entered By: Rhae Hammock on 07/03/2022 15:21:23

## 2022-07-05 NOTE — Progress Notes (Signed)
Reddix, [name] ([id number].pdf Page 1 of 1 Visit Report for 07/03/2022 SuperBill Details Patient Name: Date of Service: Hehn, [name] 07/03/2022 Medical Record Number: [id number] Patient Account Number: [id number] Date of Birth/Sex: Treating RN: 02/26/1946 (77 y.o. 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 Electronic Signature(s) Signed: 07/04/2022 10:13:15 AM By: Kalman Shan DO Signed: 07/04/2022 12:08:46 PM By: Rhae Hammock RN Entered By: Rhae Hammock on 07/03/2022 15:21:59

## 2022-07-07 NOTE — Progress Notes (Signed)
Clingenpeel, [name] ([id number]) [phone].pdf Page 1 of 7 Visit Report for 06/04/2022 Arrival Information Details Patient Name: Date of Service: [name], [name] [name] 06/04/2022 1:30 PM Medical Record Number: [id number] Patient Account Number: [id number] Date of Birth/Sex: Treating RN: 1945-05-25 (77 y.o. M) Primary Care Lillyann Ahart: [name] Other Clinician: Referring Raynette Arras: Treating Aquarius Latouche/Extender: [name] in Treatment: 10 Visit Information History Since Last Visit Added or deleted any medications: No Patient Arrived: Cane Any new allergies or adverse reactions: No Arrival Time: 13:51 Had a fall or experienced change in No Accompanied By: self activities of daily living that may affect Transfer Assistance: None risk of falls: Patient Identification Verified: Yes Signs or symptoms of abuse/neglect since last visito No Secondary Verification Process Completed: Yes Hospitalized since last visit: No Patient Requires Transmission-Based Precautions: No Implantable device outside of the clinic excluding No Patient Has Alerts: Yes cellular tissue based products placed in the center Patient Alerts: Patient on Blood Thinner since last visit: Right ABI in clinic [place] Has Compression in Place as Prescribed: Yes Pain Present Now: No Electronic Signature(s) Signed: 06/04/2022 4:25:20 PM By: Erenest Blank Entered By: Erenest Blank on 06/04/2022 13:51:43 -------------------------------------------------------------------------------- Compression Therapy Details Patient Name: Date of Service: [name] [name], [name] 06/04/2022 1:30 PM Medical Record Number: [id number] Patient Account Number: [id number] Date of Birth/Sex: Treating RN: February 05, 1946 (77 y.o. [name] Primary Care Angalena Cousineau: [name] Other Clinician: Referring Anirudh Baiz: Treating Zay Yeargan/Extender: [name] Weeks in Treatment: 10 Compression  Therapy Performed for Wound Assessment: Wound #6 Right,Medial Lower Leg Performed By: Clinician Rhae Hammock, RN Compression Type: Four Layer Post Procedure Diagnosis Same as Pre-procedure Electronic Signature(s) Signed: 07/07/2022 10:40:58 AM By: Rhae Hammock RN Entered By: Rhae Hammock on 06/04/2022 14:36:17 Cancio, [name] ([id number]) [id number] Page 2 of 7 -------------------------------------------------------------------------------- Compression Therapy Details Patient Name: Date of Service: [name], [name] 06/04/2022 1:30 PM Medical Record Number: [id number] Patient Account Number: [id number] Date of Birth/Sex: Treating RN: Jul 24, 1945 (77 y.o. [name], [name] Primary Care Shelva Hetzer: [name] Other Clinician: Referring Kiven Vangilder: Treating Makenna Macaluso/Extender: [name] Weeks in Treatment: 10 Compression Therapy Performed for Wound Assessment: Wound #7 Left,Medial Lower Leg Performed By: Clinician Rhae Hammock, RN Compression Type: Four Layer Post Procedure Diagnosis Same as Pre-procedure Electronic Signature(s) Signed: 07/07/2022 10:40:58 AM By: Rhae Hammock RN Entered By: Rhae Hammock on 06/04/2022 14:36:17 -------------------------------------------------------------------------------- Encounter Discharge Information Details Patient Name: Date of Service: [name], [name] 06/04/2022 1:30 PM Medical Record Number: [id number] Patient Account Number: [id number] Date of Birth/Sex: Treating RN: 1946/03/24 (77 y.o. [name], [name] Primary Care Janayah Zavada: [name] Other Clinician: Referring Connie Hilgert: Treating Averie Hornbaker/Extender: [name] in Treatment: 10 Encounter Discharge Information Items Discharge Condition: Stable Ambulatory Status: Ambulatory Discharge Destination: Home Transportation: Private Auto Accompanied By: self Schedule Follow-up  Appointment: Yes Clinical Summary of Care: Patient Declined Electronic Signature(s) Signed: 07/07/2022 10:40:58 AM By: Rhae Hammock RN Entered By: Rhae Hammock on 06/04/2022 15:52:03 -------------------------------------------------------------------------------- Lower Extremity Assessment Details Patient Name: Date of Service: [name], [name] 06/04/2022 1:30 PM Medical Record Number: [id number] Patient Account Number: [id number] Date of Birth/Sex: Treating RN: May 01, 1946 (77 y.o. M) Primary Care Irys Nigh: [name] Other Clinician: Referring Narya Beavin: Treating Teegan Guinther/Extender: [name], [name] Weeks in Treatment: 10 Edema Assessment Assessed: [Left: No] [Right: No] Edema: [Left: Yes] [Right: Yes] Calf Labarbera, Artie ([id number]) [phone].pdf Page 3 of 7 Left: Right: Point of Measurement: 40 cm From  Medial Instep 61 cm 68 cm Ankle Left: Right: Point of Measurement: 13 cm From Medial Instep 42 cm 43 cm Electronic Signature(s) Signed: 06/04/2022 4:25:20 PM By: Erenest Blank Entered By: Erenest Blank on 06/04/2022 14:18:28 -------------------------------------------------------------------------------- Multi-Disciplinary Care Plan Details Patient Name: Date of Service: [name], [name] 06/04/2022 1:30 PM Medical Record Number: [id number] Patient Account Number: [id number] Date of Birth/Sex: Treating RN: 01/04/1946 (77 y.o. [name], [name] Primary Care Senia Even: [name] Other Clinician: Referring Keahi Mccarney: Treating Zayla Agar/Extender: [name] Weeks in Treatment: 10 Active Inactive Wound/Skin Impairment Nursing Diagnoses: Impaired tissue integrity Knowledge deficit related to ulceration/compromised skin integrity Goals: Patient will have a decrease in wound volume by X% from date: (specify in notes) Date Initiated: 03/26/2022 Target Resolution Date: 06/20/2022 Goal Status:  Active Patient/caregiver will verbalize understanding of skin care regimen Date Initiated: 03/26/2022 Target Resolution Date: 06/20/2022 Goal Status: Active Ulcer/skin breakdown will have a volume reduction of 30% by week 4 Date Initiated: 03/26/2022 Date Inactivated: 05/21/2022 Target Resolution Date: 05/17/2022 Unmet Reason: see wound Goal Status: Unmet measurement. Ulcer/skin breakdown will have a volume reduction of 50% by week 8 Date Initiated: 03/26/2022 Date Inactivated: 05/21/2022 Target Resolution Date: 05/17/2022 Unmet Reason: see wound Goal Status: Unmet measurement. Interventions: Assess patient/caregiver ability to obtain necessary supplies Assess patient/caregiver ability to perform ulcer/skin care regimen upon admission and as needed Assess ulceration(s) every visit Notes: Patient stated today, "I will take my fluid pill or pump not do both." Treavor Blomquist made aware. Electronic Signature(s) Signed: 07/07/2022 10:40:58 AM By: Rhae Hammock RN Entered By: Rhae Hammock on 06/04/2022 14:29:48 Omary, [name] ([id number].pdf Page 4 of 7 -------------------------------------------------------------------------------- Pain Assessment Details Patient Name: Date of Service: [name], [name] 06/04/2022 1:30 PM Medical Record Number: [id number] Patient Account Number: [id number] Date of Birth/Sex: Treating RN: 1945/12/19 (77 y.o. M) Primary Care Riaz Onorato: [name] Other Clinician: Referring Fidela Cieslak: Treating Azad Calame/Extender: [name], [name] Weeks in Treatment: 10 Active Problems Location of Pain Severity and Description of Pain Patient Has Paino No Site Locations Pain Management and Medication Current Pain Management: Electronic Signature(s) Signed: 06/04/2022 4:25:20 PM By: Erenest Blank Entered By: Erenest Blank on 06/04/2022  13:54:26 -------------------------------------------------------------------------------- Patient/Caregiver Education Details Patient Name: Date of Service: [name], [name] 1/17/2024andnbsp1:30 PM Medical Record Number: [id number] Patient Account Number: [id number] Date of Birth/Gender: Treating RN: 19-Apr-1946 (77 y.o. [name] Primary Care Physician: [name] Other Clinician: Referring Physician: Treating Physician/Extender: [name] in Treatment: 10 Education Assessment Education Provided To: Patient Education Topics Provided Wound/Skin Impairment: Methods: Explain/Verbal Responses: Reinforcements needed, State content correctly Electronic Signature(s) Signed: 07/07/2022 10:40:58 AM By: Rhae Hammock RN Entered By: Rhae Hammock on 06/04/2022 14:30:02 Athanas, [name] ([id number]) [id number] Page 5 of 7 -------------------------------------------------------------------------------- Wound Assessment Details Patient Name: Date of Service: [name], [name] 06/04/2022 1:30 PM Medical Record Number: [id number] Patient Account Number: [id number] Date of Birth/Sex: Treating RN: Aug 18, 1945 (77 y.o. M) Primary Care Fotios Amos: [name] Other Clinician: Referring Taje Littler: Treating Eliska Hamil/Extender: [name], [name] Weeks in Treatment: 10 Wound Status Wound Number: 6 Primary Lymphedema Etiology: Wound Location: Right, Medial Lower Leg Wound Open Wounding Event: Gradually Appeared Status: Date Acquired: 03/05/2022 Comorbid Anemia, Lymphedema, Deep Vein Thrombosis, Hypertension, Weeks Of Treatment: 10 History: Received Radiation Clustered Wound: Yes Photos Wound Measurements Length: (cm) 1 Width: (cm) 1 Depth: (cm) 0 Clustered Quantity: 3 Area: (cm) Volume: (cm) 5 % Reduction in Area: -19.5% 4.5 % Reduction in  Volume: -19.5% .1 Epithelialization: None Tunneling:  No 170.824 Undermining: No 17.082 Wound Description Classification: Full Thickness Without Exposed Sup Wound Margin: Distinct, outline attached Exudate Amount: Large Exudate Type: Purulent Exudate Color: yellow, brown, green port Structures Foul Odor After Cleansing: No Slough/Fibrino Yes Wound Bed Granulation Amount: None Present (0%) Exposed Structure Necrotic Amount: Large (67-100%) Fascia Exposed: No Necrotic Quality: Adherent Slough Fat Layer (Subcutaneous Tissue) Exposed: Yes Tendon Exposed: No Muscle Exposed: No Joint Exposed: No Bone Exposed: No Periwound Skin Texture Texture Color No Abnormalities Noted: No No Abnormalities Noted: No Callus: No Atrophie Blanche: No Crepitus: No Cyanosis: No Excoriation: No Ecchymosis: No Induration: No Erythema: No Rash: No Hemosiderin Staining: No Scarring: No Mottled: No Pallor: No Moisture Rubor: No No Abnormalities Noted: No [name], [name] ([id number].pdf Page 6 of 7 Dry / Scaly: No Maceration: No Electronic Signature(s) Signed: 06/04/2022 4:25:20 PM By: Erenest Blank Entered By: Erenest Blank on 06/04/2022 14:20:30 -------------------------------------------------------------------------------- Wound Assessment Details Patient Name: Date of Service: [name], [name] 06/04/2022 1:30 PM Medical Record Number: [id number] Patient Account Number: [id number] Date of Birth/Sex: Treating RN: [birth year] (77 y.o. M) Primary Care Iley Deignan: [name] Other Clinician: Referring Annsley Akkerman: Treating Paylin Hailu/Extender: [name], [name] Weeks in Treatment: 10 Wound Status Wound Number: 7 Primary Lymphedema Etiology: Wound Location: Left, Medial Lower Leg Wound Open Wounding Event: Gradually Appeared Status: Date Acquired: 03/05/2022 Comorbid Anemia, Lymphedema, Deep Vein Thrombosis, Hypertension, Weeks Of Treatment: 10 History: Received Radiation Clustered Wound:  No Photos Wound Measurements Length: (cm) 4 Width: (cm) 3 Depth: (cm) 0.1 Area: (cm) 9.425 Volume: (cm) 0.942 % Reduction in Area: 80% % Reduction in Volume: 80% Epithelialization: Small (1-33%) Tunneling: No Undermining: No Wound Description Classification: Full Thickness Without Exposed Suppor Wound Margin: Distinct, outline attached Exudate Amount: Medium Exudate Type: Serosanguineous Exudate Color: red, brown t Structures Foul Odor After Cleansing: No Slough/Fibrino Yes Wound Bed Granulation Amount: Medium (34-66%) Exposed Structure Granulation Quality: Red, Pink Fascia Exposed: No Necrotic Amount: Medium (34-66%) Fat Layer (Subcutaneous Tissue) Exposed: Yes Necrotic Quality: Adherent Slough Tendon Exposed: No Muscle Exposed: No Joint Exposed: No Bone Exposed: No Periwound Skin Texture Texture Color No Abnormalities Noted: No No Abnormalities Noted: No Callus: No Atrophie Blanche: No [name], [name] ([id number].pdf Page 7 of 7 Crepitus: No Cyanosis: No Excoriation: No Ecchymosis: No Induration: No Erythema: No Rash: No Hemosiderin Staining: No Scarring: No Mottled: No Pallor: No Moisture Rubor: No No Abnormalities Noted: No Dry / Scaly: No Maceration: No Electronic Signature(s) Signed: 06/04/2022 4:25:20 PM By: Erenest Blank Entered By: Erenest Blank on 06/04/2022 14:21:03 -------------------------------------------------------------------------------- Vitals Details Patient Name: Date of Service: [name], [name] 06/04/2022 1:30 PM Medical Record Number: [id number] Patient Account Number: [id number] Date of Birth/Sex: Treating RN: September 14, 1945 (77 y.o. M) Primary Care Zayda Angell: [name] Other Clinician: Referring Juanell Saffo: Treating Tahj Njoku/Extender: [name], [name] Weeks in Treatment: 10 Vital Signs Time Taken: 13:52 Temperature (F): 97.9 Height (in): 74 Pulse (bpm): 108 Weight  (lbs): 250 Respiratory Rate (breaths/min): 18 Body Mass Index (BMI): 32.1 Blood Pressure (mmHg): 196/76 Reference Range: 80 - 120 mg / dl Electronic Signature(s) Signed: 06/04/2022 4:25:20 PM By: Erenest Blank Entered By: Erenest Blank on 06/04/2022 13:54:19

## 2022-07-07 NOTE — Progress Notes (Signed)
Bargar, [name] ([id number] Page 1 of 10 Visit Report for 05/28/2022 Chief Complaint Document Details Patient Name: Date of Service: [name], [name] 05/28/2022 11:15 A M Medical Record Number: [id number] Patient Account Number: [id number] Date of Birth/Sex: Treating RN: June 21, 1945 (77 y.o. M) Primary Care Provider: Birdie [name] Other Clinician: Referring Provider: Treating Provider/Extender: [name] Weeks in Treatment: 9 Information Obtained from: Patient Chief Complaint 02/23/2020; patient is here for wounds on his bilateral lower legs in the setting of severe lymphedema 03/26/2022; patient is here for wounds on his bilateral lower legs medial aspect Electronic Signature(s) Signed: 05/28/2022 11:48:45 AM By: [name] Keeler PA-C Entered By: [name] on 05/28/2022 11:48:44 -------------------------------------------------------------------------------- Debridement Details Patient Name: Date of Service: [name], [name] 05/28/2022 11:15 A M Medical Record Number: [id number] Patient Account Number: [id number] Date of Birth/Sex: Treating RN: 05/05/1946 (77 y.o. [name], [name] Primary Care Provider: Birdie [name] Other Clinician: Referring Provider: Treating Provider/Extender: [name] Weeks in Treatment: 9 Debridement Performed for Assessment: Wound #7 Left,Medial Lower Leg Performed By: Physician [name] Keeler, PA Debridement Type: Debridement Level of Consciousness (Pre-procedure): Awake and Alert Pre-procedure Verification/Time Out Yes - 11:55 Taken: Start Time: 11:55 Pain Control: Lidocaine T Area Debrided (L x W): otal 1 (cm) x 1 (cm) = 1 (cm) Tissue and other material debrided: Viable, Non-Viable, Slough, Subcutaneous, Skin: Epidermis, Slough Level: Skin/Subcutaneous Tissue Debridement Description: Excisional Instrument: Curette Bleeding: Minimum Hemostasis Achieved:  Pressure End Time: 11:55 Procedural Pain: 0 Post Procedural Pain: 0 Response to Treatment: Procedure was tolerated well Level of Consciousness (Post- Awake and Alert procedure): Post Debridement Measurements of Total Wound Length: (cm) 4 Width: (cm) 3.5 Depth: (cm) 0.1 Volume: (cm) 1.1 Character of Wound/Ulcer Post Debridement: Improved [name] ([id number] Page 2 of 10 Post Procedure Diagnosis Same as Pre-procedure Electronic Signature(s) Signed: 05/28/2022 4:42:12 PM By: [name] Keeler PA-C Signed: 07/07/2022 10:40:58 AM By: [name] Hammock RN Entered By: [name] on 05/28/2022 11:56:23 -------------------------------------------------------------------------------- Debridement Details Patient Name: Date of Service: [name], [name] 05/28/2022 11:15 A M Medical Record Number: [id number] Patient Account Number: [id number] Date of Birth/Sex: Treating RN: [birth year] (77 y.o. [name], [name] Primary Care Provider: Birdie [name] Other Clinician: Referring Provider: Treating Provider/Extender: [name] Weeks in Treatment: 9 Debridement Performed for Assessment: Wound #6 Right,Medial Lower Leg Performed By: Physician [name] Keeler, PA Debridement Type: Debridement Level of Consciousness (Pre-procedure): Awake and Alert Pre-procedure Verification/Time Out Yes - 11:55 Taken: Start Time: 11:55 Pain Control: Lidocaine T Area Debrided (L x W): otal 13 (cm) x 1.5 (cm) = 19.5 (cm) Viable, Non-Viable, Slough, Subcutaneous, Skin: Epidermis, Slough, Other: dressing material (hydrofera blue stuck Tissue and other material debrided: to wounds) Level: Skin/Subcutaneous Tissue Debridement Description: Excisional Instrument: Curette Bleeding: Minimum Hemostasis Achieved: Pressure End Time: 11:55 Procedural Pain: 0 Post Procedural Pain: 0 Response to Treatment: Procedure was tolerated well Level  of Consciousness (Post- Awake and Alert procedure): Post Debridement Measurements of Total Wound Length: (cm) 12.5 Width: (cm) 14 Depth: (cm) 0.1 Volume: (cm) 13.744 Character of Wound/Ulcer Post Debridement: Improved Post Procedure Diagnosis Same as Pre-procedure Electronic Signature(s) Signed: 05/28/2022 4:42:12 PM By: [name] Keeler PA-C Signed: 07/07/2022 10:40:58 AM By: [name] Hammock RN Entered By: [name] on 05/28/2022 11:59:31 -------------------------------------------------------------------------------- HPI Details Patient Name: Date of Service: [name], [name] 05/28/2022 11:15 A M Medical Record Number: [id number] Patient Account Number: [id number] [name] ([id number]) [phone].pdf Page  3 of 10 Date of Birth/Sex: Treating RN: 21-Aug-1945 (77 y.o. M) Primary Care Provider: Other Clinician: Birdie [name] Referring Provider: Treating Provider/Extender: [name] Weeks in Treatment: 9 History of Present Illness HPI Description: ADMISSION 02/23/2020 Patient is a 77 year old man who lives in [place] who arrives accompanied by his wife. He has a history of chronic lymphedema and venous insufficiency in his bilateral lower legs which may have something to do that with having a history of DVT as well as being treated for prostate cancer. In any case he recently got compression pumps at home but compliance has been an issue here. He has compression stockings however they are probably not sufficient enough to control swelling. They tell us that things deteriorated for him in late August he was admitted to [facility] for 7 days. This was with cellulitis I think of his bilateral lower legs. Discharge he was noted to have wounds on his bilateral lower legs. He was discharged on Bactrim. They tried to get him home health through [facility] part [facility] of course they declined him. His wife is been  wrapping these applying some form of silver foam dressing. He has a history of wounds before although nothing that would not heal with basic home topical dressings. He has 2 areas on the left medial, left anterior and left lateral and a smaller area on the right medial. All of these have considerable depth. Past medical history includes iron deficiency anemia, lymphedema followed by the rehab center at [facility] with lymphedema wraps I believe, DVT on chronic anticoagulation, prostate cancer, chronic venous insufficiency, hypertension. As mentioned he has compression pumps but does not use them. ABIs in our clinic were noncompressible bilaterally 10/14; patient with severe bilateral lymphedema right greater than left. He came in with bilateral lower extremity wounds left greater than right. Even though the right side has more of the edema most of the wounds here almost closed on the right medial. He has 3 remaining wounds on the left We have been using silver alginate under 4-layer compression I have been trying to get him to be compliant with his external compression pumps 10/21; patient with 3 small wounds on the left leg and 1 on the right medial in the setting of severe lymphedema and chronic venous insufficiency. We have been using silver alginate under 4-layer compression he is using his external compression pumps twice a day 11/4; ARTERIAL STUDIES on the right show an ABI of 1.02 TBI of 0.858 with biphasic waveforms on the left 0.98 with a TBI of 0.55 and biphasic waveforms. Does not look like he has significant arterial disease. We are treating him for lymphedema he has compression pumps. He has punched-out areas on the left anterior left lateral and right medial lower extremities 11/11; after we obtained his arterial studies I put him in 4 layer compression. He is using his compression pumps probably once a day although I have asked him to do twice. Primary dressing to the wound is silver  collagen he has severe lymphedema likely secondary to chronic venous insufficiency. Wounds on the left lateral, left medial and left anterior and a small area on the right medial 12/2; the area on the right anterior lower leg has healed. We initially thought that the area medially had healed as well however when her discharge nurse came in she detected fluid in the wound simply opened up. This is actually worse than I remember this pain. The area  on the left lateral potentially slightly smaller He is also complaining about pain in his left hand he says that this is actually been getting some better he has been using topical creams on this. She asked that I look at this 12/9 after last weeks issues we have 2 wounds one on the right medial lower leg and 1 on the left lateral. Both of these are in the same condition. I think because of thickened skin secondary to chronic lymphedema these wounds actually have depth of almost 0.8 cm. 12/16; the patient has 2 small but deep wounds one on the right medial and one on the left lateral. The right medial is actually the worst of these. He arrives in clinic today with absolutely terrible edema in the right leg apparently his 4-layer wrap fell down to just above his ankle he did not think about this he is apparently been continuing to use his compression pump twice a day. The left leg looks a lot better. 05/09/2020 upon evaluation today patient appears to be doing decently well in regard to his wounds. Everything is measuring smaller the right leg still has a little bit deeper wound in the left seems to be almost completely healed in my opinion I am very pleased in general with how things are progressing. He has a 4- layer compression wrap we have been using endoform today we will probably have to use collagen just based on the fact that we do not have endoform it is on order. 1/6; the patient's wound on the left lateral lower leg has healed. Still has 1 on the  right medial. 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 Using compression pumps at home twice a day. 1/13; left lateral lower leg is still healed. He has a deep punched out rectangular shaped wound on the right medial calf. Looking down at this it appears that he is attempting to epithelialize around the edges of the wound and on the base as well. His edema is reasonably well controlled we have been using collagen with absolutely no effect 1/20; left lateral lower leg remains closed he has extremitease stockings. The area on the right medial calf I aggressively debrided last week measures larger but the surface looks better. We have been using Hydrofera Blue. We ran Oasis through his insurance but we have not seen the results of this 1/27; left lower leg wound with chronic venous insufficiency and secondary lymphedema. I did aggressive debridement on this last week the wound seems to have come in healthy looking surface using Hydrofera Blue. He was denied for Oasis 2/3; small divot in the right medial lower leg. Under illumination the walls of this divot are epithelialized however the base has slough which I removed with a curette we have been using Hydrofera Blue 2/10 small divot on the right medial lower leg pinpoint illumination at the base of this cone-shaped wound. We have been using Hydrofera Blue but I will switch to calcium alginate this week 2/17; the small divot on the right medial lower leg is fully epithelialized. There is no visible open area under illumination. He has his own stocking for the right leg similar to the one he has been wearing on the left. 03/26/2022; READMISSION This is a now 77 year old man that we had in the clinic from 02/23/2020 through 07/05/2020. At that point he had bilateral lower extremity wounds left greater than right in the setting of severe lymphedema. He had already obtained compression pumps ordered for him  I think from the wound care  clinic in [facility] so I do not really have record of what he has been using. He claims to be using them once a day but there is a problem with the sleeve on the left leg. About 2 weeks ago he was hospitalized from 03/11/2022 through [id number] with diastolic congestive heart failure. 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 He was diuresed. Developed some prerenal azotemia and he has not been taking any diuretics currently. He has not been putting stockings on his legs since he got out of hospital and still has his legs dependent for long periods. Past medical history history of prostate cancer treated with prostatectomy and radiation this was apparently about 8 years ago, history of DVT on chronic Coumadin, history of lymphedema was managed for a while at the clinic in [place]. History of inguinal hernia repair in September 22, hypertension, stage IIIb chronic renal failure ABIs today were noncompressible on the right 1.12 on the left [name], [name] ([id number]) [phone].pdf Page 4 of 10 04-02-2022 upon evaluation today patient appears to be doing well currently in regard to his legs I do feel like both areas that are draining are actually much drier than they were in the picture last week although the left is drier than the right. He is tolerating the 4-layer compression wraps at this point he did contact the pump company and they are actually working on getting him a new compression sleeve for one of his legs which have previously popped and was not functioning properly. 04-23-2022 upon evaluation today patient appears to be doing well currently in regard to his wounds on the legs. I am actually very pleased with where things stand and I do feel like that we are headed in the right direction. Fortunately there is no sign of active infection locally or systemically at this time. 05-07-2022 upon evaluation today  patient appears to be doing well currently in regard to his wounds in fact things are showing signs of improvement which is good news I do not see too much that actually appears to be open and I am very pleased in that regard. No fevers, chills, nausea, vomiting, or diarrhea. 05-21-2022 upon evaluation today patient appears to be doing somewhat poorly in regard to drainage of his lower extremities bilaterally. The right is greater than left as far as the weeping area. Nonetheless it seems to be getting worse not better. He actually has pitting edema which is at least 2+ to the thighs and I am concerned about the fact that he is may be fluid overloaded in general and that is the reason why we cannot get this under control. I know he is not using his pumps all the time because he actually told the nurse that he was either going to pump or he was going to use his fluid pills but not do both. For that reason I do think that he needs to be really doing both in order to get the fluid out as effectively as possible obviously with the 4-layer compression wraps were doing as much as we can from a compression standpoint but it is really not enough. He tells me that he elevates his leg is much as he can in between pumping and other activity throughout the day. 05-28-2022 upon evaluation today patient appears to be doing better in regard to his wounds although the measurements may be a little bit larger this  is a very difficult wound to heal it is very indistinct in a lot of areas. Nonetheless there is can be some need for sharp debridement in regard to both medial and lateral legs. Fortunately I see no signs of active infection locally nor systemically at this time. No fevers, chills, nausea, vomiting, or diarrhea. Electronic Signature(s) Signed: 05/28/2022 2:08:27 PM By: [name] Keeler PA-C Entered By: [name] on 05/28/2022  14:08:26 -------------------------------------------------------------------------------- Physical Exam Details Patient Name: Date of Service: [name], [name] 05/28/2022 11:15 A M Medical Record Number: [id number] Patient Account Number: [id number] Date of Birth/Sex: Treating RN: August 09, 1945 (77 y.o. M) Primary Care Provider: Birdie [name] Other Clinician: Referring Provider: Treating Provider/Extender: [name] Weeks in Treatment: 9 Constitutional Well-nourished and well-hydrated in no acute distress. Respiratory normal breathing without difficulty. Psychiatric this patient is able to make decisions and demonstrates good insight into disease process. Alert and Oriented x 3. pleasant and cooperative. Notes Upon inspection patient's wound again did require sharp debridement clearway some of the necrotic debris including dressing material which was stuck to the wound. He tolerated that today without complication and postdebridement is significantly improved which is great news. the wound bed Electronic Signature(s) Signed: 05/28/2022 2:08:43 PM By: [name] Keeler PA-C Entered By: [name] on 05/28/2022 14:08:43 -------------------------------------------------------------------------------- Physician Orders Details Patient Name: Date of Service: [name], [name] 05/28/2022 11:15 A M Medical Record Number: [id number] Patient Account Number: [id number] Date of Birth/Sex: Treating RN: 1945-06-25 (77 y.o. [name], [name] Primary Care Provider: Birdie [name] Other Clinician: Referring Provider: Treating Provider/Extender: [name] in Treatment: 9 Hansmann, [name] ([id number]) [id number] Page 5 of 10 Verbal / Phone Orders: No Diagnosis Coding Follow-up Appointments ppointment in 1 week. Margarita Grizzle on Wednesdays 06/04/22 @ 1:30 Rm # 8 Return A ppointment in 2 weeks. Burman Blacksmith on  Wednesday's Return A Other: - ****ask Cardiology about fluid control in both legs- thighs and lower legs. Ensure to ask how much to drink fluids. Bathing/ Shower/ Hygiene May shower with protection but do not get wound dressing(s) wet. Protect dressing(s) with water repellant cover (for example, large plastic bag) or a cast cover and may then take shower. Edema Control - Lymphedema / SCD / Other Lymphedema Pumps. Use Lymphedema pumps on leg(s) 2-3 times a day for 45-60 minutes. If wearing any wraps or hose, do not remove them. Continue exercising as instructed. Elevate legs to the level of the heart or above for 30 minutes daily and/or when sitting for 3-4 times a day throughout the day. Avoid standing for long periods of time. Exercise regularly Wound Treatment Wound #6 - Lower Leg Wound Laterality: Right, Medial Cleanser: Soap and Water 1 x Per Week/7 Days Discharge Instructions: May shower and wash wound with dial antibacterial soap and water prior to dressing change. Peri-Wound Care: Sween Lotion (Moisturizing lotion) 1 x Per Week/7 Days Discharge Instructions: Apply moisturizing lotion as directed Prim Dressing: Hydrofera Blue Classic Foam, 4x4 in 1 x Per Week/7 Days ary Discharge Instructions: Moisten with saline prior to applying to wound bed Secondary Dressing: ABD Pad, 5x9 1 x Per Week/7 Days Discharge Instructions: Apply over primary dressing as directed. Secondary Dressing: Woven Gauze Sponge, Non-Sterile 4x4 in 1 x Per Week/7 Days Discharge Instructions: Apply over primary dressing as directed. Compression Wrap: FourPress (4 layer compression wrap) 1 x Per Week/7 Days Discharge Instructions: Apply four layer compression as directed. May also use Miliken CoFlex  2 layer compression system as alternative. Wound #7 - Lower Leg Wound Laterality: Left, Medial Cleanser: Soap and Water 1 x Per Week/7 Days Discharge Instructions: May shower and wash wound with dial antibacterial soap  and water prior to dressing change. Peri-Wound Care: Sween Lotion (Moisturizing lotion) 1 x Per Week/7 Days Discharge Instructions: Apply moisturizing lotion as directed Prim Dressing: Hydrofera Blue Classic Foam, 4x4 in 1 x Per Week/7 Days ary Discharge Instructions: Moisten with saline prior to applying to wound bed Secondary Dressing: ABD Pad, 5x9 1 x Per Week/7 Days Discharge Instructions: Apply over primary dressing as directed. Secondary Dressing: Woven Gauze Sponge, Non-Sterile 4x4 in 1 x Per Week/7 Days Discharge Instructions: Apply over primary dressing as directed. Compression Wrap: FourPress (4 layer compression wrap) 1 x Per Week/7 Days Discharge Instructions: Apply four layer compression as directed. May also use Miliken CoFlex 2 layer compression system as alternative. Electronic Signature(s) Signed: 05/28/2022 4:42:12 PM By: [name] Keeler PA-C Signed: 07/07/2022 10:40:58 AM By: [name] Hammock RN Entered By: [name] on 05/28/2022 11:54:44 Problem List Details -------------------------------------------------------------------------------- [name], [name] ([id number] Page 6 of 10 Patient Name: Date of Service: [name], [name] 05/28/2022 11:15 A M Medical Record Number: [id number] Patient Account Number: [id number] Date of Birth/Sex: Treating RN: 1945-09-12 (77 y.o. M) Primary Care Provider: Birdie [name] Other Clinician: Referring Provider: Treating Provider/Extender: [name] Weeks in Treatment: 9 Active Problems ICD-10 Encounter Code Description Active Date MDM Diagnosis L97.828 Non-pressure chronic ulcer of other part of left lower leg with other specified 03/26/2022 No Yes severity 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 other part of right lower leg with other specified 03/26/2022 No Yes severity I89.0 Lymphedema, not elsewhere classified 03/26/2022 No Yes Inactive Problems Resolved  Problems Electronic Signature(s) Signed: 05/28/2022 11:48:27 AM By: [name] Keeler PA-C Entered By: [name] on 05/28/2022 11:48:27 -------------------------------------------------------------------------------- Progress Note Details Patient Name: Date of Service: [name], [name] 05/28/2022 11:15 A M Medical Record Number: [id number] Patient Account Number: [id number] Date of Birth/Sex: Treating RN: 1946/05/16 (77 y.o. M) Primary Care Provider: Birdie [name] Other Clinician: Referring Provider: Treating Provider/Extender: [name] Weeks in Treatment: 9 Subjective Chief Complaint Information obtained from Patient 02/23/2020; patient is here for wounds on his bilateral lower legs in the setting of severe lymphedema 03/26/2022; patient is here for wounds on his bilateral lower legs medial aspect History of Present Illness (HPI) ADMISSION 02/23/2020 Patient is a 77 year old man who lives in [place] who arrives accompanied by his wife. He has a history of chronic lymphedema and venous insufficiency in his bilateral lower legs which may have something to do that with having a history of DVT as well as being treated for prostate cancer. In any case he recently got compression pumps at home but compliance has been an issue here. He has compression stockings however they are probably not sufficient enough to control swelling. They tell us that things deteriorated for him in late August he was admitted to [facility] for 7 days. This was with cellulitis I think of his bilateral lower legs. Discharge he was noted to have wounds on his bilateral lower legs. He was discharged on Bactrim. They tried to get him home health through [name] part C of course they declined him. His wife is been wrapping these applying some form of silver foam dressing. He has a history of wounds before although nothing that would not heal with basic home topical  dressings.  He has 2 areas on the left medial, left anterior and left lateral and a smaller area on the right medial. All of these have considerable depth. Past medical history includes iron deficiency anemia, lymphedema followed by the rehab center at [facility] with lymphedema wraps I believe, DVT on chronic anticoagulation, prostate cancer, chronic venous insufficiency, hypertension. As mentioned he has compression pumps but does not use them. Thaw, [name] ([id number] Page 7 of 10 ABIs in our clinic were noncompressible bilaterally 10/14; patient with severe bilateral lymphedema right greater than left. He came in with bilateral lower extremity wounds left greater than right. Even though the right side has more of the edema most of the wounds here almost closed on the right medial. He has 3 remaining wounds on the left We have been using silver alginate under 4-layer compression I have been trying to get him to be compliant with his external compression pumps 10/21; patient with 3 small wounds on the left leg and 1 on the right medial in the setting of severe lymphedema and chronic venous insufficiency. We have been using silver alginate under 4-layer compression he is using his external compression pumps twice a day 11/4; ARTERIAL STUDIES on the right show an ABI of 1.02 TBI of 0.858 with biphasic waveforms on the left 0.98 with a TBI of 0.55 and biphasic waveforms. Does not look like he has significant arterial disease. We are treating him for lymphedema he has compression pumps. He has punched-out areas on the left anterior left lateral and right medial lower extremities 11/11; after we obtained his arterial studies I put him in 4 layer compression. He is using his compression pumps probably once a day although I have asked him to do twice. Primary dressing to the wound is silver collagen he has severe lymphedema likely secondary to chronic venous insufficiency.  Wounds on the left lateral, left medial and left anterior and a small area on the right medial 12/2; the area on the right anterior lower leg has healed. We initially thought that the area medially had healed as well however when her discharge nurse came in she detected fluid in the wound simply opened up. This is actually worse than I remember this pain. The area on the left lateral potentially slightly smaller He is also complaining about pain in his left hand he says that this is actually been getting some better he has been using topical creams on this. She asked that I look at this 12/9 after last weeks issues we have 2 wounds one on the right medial lower leg and 1 on the left lateral. Both of these are in the same condition. I think because of thickened skin secondary to chronic lymphedema these wounds actually have depth of almost 0.8 cm. 12/16; the patient has 2 small but deep wounds one on the right medial and one on the left lateral. The right medial is actually the worst of these. He arrives in clinic today with absolutely terrible edema in the right leg apparently his 4-layer wrap fell down to just above his ankle he did not think about this he is apparently been continuing to use his compression pump twice a day. The left leg looks a lot better. 05/09/2020 upon evaluation today patient appears to be doing decently well in regard to his wounds. Everything is measuring smaller the right leg still has a little bit deeper wound in the left seems to be almost completely healed in my opinion I  am very pleased in general with how things are progressing. He has a 4- layer compression wrap we have been using endoform today we will probably have to use collagen just based on the fact that we do not have endoform it is on order. 1/6; the patient's wound on the left lateral lower leg has healed. Still has 1 on the right medial. 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  Using compression pumps at home twice a day. 1/13; left lateral lower leg is still healed. He has a deep punched out rectangular shaped wound on the right medial calf. Looking down at this it appears that he is attempting to epithelialize around the edges of the wound and on the base as well. His edema is reasonably well controlled we have been using collagen with absolutely no effect 1/20; left lateral lower leg remains closed he has extremitease stockings. The area on the right medial calf I aggressively debrided last week measures larger but the surface looks better. We have been using Hydrofera Blue. We ran Oasis through his insurance but we have not seen the results of this 1/27; left lower leg wound with chronic venous insufficiency and secondary lymphedema. I did aggressive debridement on this last week the wound seems to have come in healthy looking surface using Hydrofera Blue. He was denied for Oasis 2/3; small divot in the right medial lower leg. Under illumination the walls of this divot are epithelialized however the base has slough which I removed with a curette we have been using Hydrofera Blue 2/10 small divot on the right medial lower leg pinpoint illumination at the base of this cone-shaped wound. We have been using Hydrofera Blue but I will switch to calcium alginate this week 2/17; the small divot on the right medial lower leg is fully epithelialized. There is no visible open area under illumination. He has his own stocking for the right leg similar to the one he has been wearing on the left. 03/26/2022; READMISSION This is a now 77 year old man that we had in the clinic from 02/23/2020 through 07/05/2020. At that point he had bilateral lower extremity wounds left greater than right in the setting of severe lymphedema. He had already obtained compression pumps ordered for him I think from the wound care clinic in [facility] so I do not really have record of what he has  been using. He claims to be using them once a day but there is a problem with the sleeve on the left leg. About 2 weeks ago he was hospitalized from 03/11/2022 through [id number] with diastolic congestive heart failure. 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 He was diuresed. Developed some prerenal azotemia and he has not been taking any diuretics currently. He has not been putting stockings on his legs since he got out of hospital and still has his legs dependent for long periods. Past medical history history of prostate cancer treated with prostatectomy and radiation this was apparently about 8 years ago, history of DVT on chronic Coumadin, history of lymphedema was managed for a while at the clinic in [place]. History of inguinal hernia repair in September 22, hypertension, stage IIIb chronic renal failure ABIs today were noncompressible on the right 1.12 on the left 04-02-2022 upon evaluation today patient appears to be doing well currently in regard to his legs I do feel like both areas that are draining are actually much drier than they were in  the picture last week although the left is drier than the right. He is tolerating the 4-layer compression wraps at this point he did contact the pump company and they are actually working on getting him a new compression sleeve for one of his legs which have previously popped and was not functioning properly. 04-23-2022 upon evaluation today patient appears to be doing well currently in regard to his wounds on the legs. I am actually very pleased with where things stand and I do feel like that we are headed in the right direction. Fortunately there is no sign of active infection locally or systemically at this time. 05-07-2022 upon evaluation today patient appears to be doing well currently in regard to his wounds in fact things are showing signs of improvement which is good news I do not see too much  that actually appears to be open and I am very pleased in that regard. No fevers, chills, nausea, vomiting, or diarrhea. 05-21-2022 upon evaluation today patient appears to be doing somewhat poorly in regard to drainage of his lower extremities bilaterally. The right is greater than left as far as the weeping area. Nonetheless it seems to be getting worse not better. He actually has pitting edema which is at least 2+ to the thighs and I am concerned about the fact that he is may be fluid overloaded in general and that is the reason why we cannot get this under control. I know he is not using his pumps all the time because he actually told the nurse that he was either going to pump or he was going to use his fluid pills but not do both. For that reason I do think that he needs to be really doing both in order to get the fluid out as effectively as possible obviously with the 4-layer compression wraps were doing as much as we can from a compression standpoint but it is really not enough. He tells me that he elevates his leg is much as he can in between pumping and other activity throughout the day. 05-28-2022 upon evaluation today patient appears to be doing better in regard to his wounds although the measurements may be a little bit larger this is a very difficult wound to heal it is very indistinct in a lot of areas. Nonetheless there is can be some need for sharp debridement in regard to both medial and lateral legs. Fortunately I see no signs of active infection locally nor systemically at this time. No fevers, chills, nausea, vomiting, or diarrhea. [name], [name] ([id number].pdf Page 8 of 10 Objective Constitutional Well-nourished and well-hydrated in no acute distress. Vitals Time Taken: 11:29 AM, Height: 74 in, Weight: 250 lbs, BMI: 32.1, Temperature: 97.9 F, Pulse: 84 bpm, Respiratory Rate: 18 breaths/min, Blood Pressure: 171/77 mmHg. Respiratory normal  breathing without difficulty. Psychiatric this patient is able to make decisions and demonstrates good insight into disease process. Alert and Oriented x 3. pleasant and cooperative. General Notes: Upon inspection patient's wound again did require sharp debridement clearway some of the necrotic debris including dressing material which was stuck to the wound. He tolerated that today without complication and postdebridement is significantly improved which is great news. the wound bed Integumentary (Hair, Skin) Wound #6 status is Open. Original cause of wound was Gradually Appeared. The date acquired was: 03/05/2022. The wound has been in treatment 9 weeks. The wound is located on the Right,Medial Lower Leg. The wound measures 12.5cm length x 14cm width x 0.1cm depth; 137.445cm^2  area and 13.744cm^3 volume. There is Fat Layer (Subcutaneous Tissue) exposed. There is no tunneling or undermining noted. There is a large amount of purulent drainage noted. The wound margin is distinct with the outline attached to the wound base. There is no granulation within the wound bed. There is a large (67-100%) amount of necrotic tissue within the wound bed including Adherent Slough. The periwound skin appearance did not exhibit: Callus, Crepitus, Excoriation, Induration, Rash, Scarring, Dry/Scaly, Maceration, Atrophie Blanche, Cyanosis, Ecchymosis, Hemosiderin Staining, Mottled, Pallor, Rubor, Erythema. Wound #7 status is Open. Original cause of wound was Gradually Appeared. The date acquired was: 03/05/2022. The wound has been in treatment 9 weeks. The wound is located on the Left,Medial Lower Leg. The wound measures 4cm length x 3.5cm width x 0.1cm depth; 10.996cm^2 area and 1.1cm^3 volume. There is Fat Layer (Subcutaneous Tissue) exposed. There is no tunneling or undermining noted. There is a medium amount of serosanguineous drainage noted. The wound margin is distinct with the outline attached to the wound base.  There is medium (34-66%) red, pink granulation within the wound bed. There is a medium (34-66%) amount of necrotic tissue within the wound bed including Adherent Slough. The periwound skin appearance did not exhibit: Callus, Crepitus, Excoriation, Induration, Rash, Scarring, Dry/Scaly, Maceration, Atrophie Blanche, Cyanosis, Ecchymosis, Hemosiderin Staining, Mottled, Pallor, Rubor, Erythema. 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 There was a Excisional Skin/Subcutaneous Tissue Debridement with a total area of 19.5 sq cm performed by [name] Keeler, PA. With the following instrument(s): Curette to remove Viable and Non-Viable tissue/material. Material removed includes Subcutaneous Tissue, Slough, Skin: Epidermis, and Other: dressing material (hydrofera blue stuck to wounds) after achieving pain control using Lidocaine. No specimens were taken. A time out was conducted at 11:55, prior to the start of the procedure. A Minimum amount of bleeding was controlled with Pressure. The procedure was tolerated well with a pain level of 0 throughout and a pain level of 0 following the procedure. Post Debridement Measurements: 12.5cm length x 14cm width x 0.1cm depth; 13.744cm^3 volume. Character of Wound/Ulcer Post Debridement is improved. Post procedure Diagnosis Wound #6: Same as Pre-Procedure Pre-procedure diagnosis of Wound #6 is a Lymphedema located on the Right,Medial Lower Leg . There was a Four Layer Compression Therapy Procedure by [name] Hammock, RN. Post procedure Diagnosis Wound #6: Same as Pre-Procedure Wound #7 Pre-procedure diagnosis of Wound #7 is a Lymphedema located  on the Left,Medial Lower Leg . There was a Excisional Skin/Subcutaneous Tissue Debridement with a total area of 1 sq cm performed by [name] Keeler, PA. With the following instrument(s): Curette to remove Viable and Non-Viable tissue/material. Material removed includes Subcutaneous Tissue, Slough, and Skin: Epidermis after achieving pain control using Lidocaine. No specimens were taken. A time out was conducted at 11:55, prior to the start of the procedure. A Minimum amount of bleeding was controlled with Pressure. The procedure was tolerated well with a pain level of 0 throughout and a pain level of 0 following the procedure. Post Debridement Measurements: 4cm length x 3.5cm width x 0.1cm depth; 1.1cm^3 volume. Character of Wound/Ulcer Post Debridement is improved. Post procedure Diagnosis Wound #7: Same as Pre-Procedure Pre-procedure diagnosis of Wound #7 is a Lymphedema located on the  Left,Medial Lower Leg . There was a Four Layer Compression Therapy Procedure by [name] Hammock, RN. [name], [name] ([id number]) [id number] Page 9 of 10 Post procedure Diagnosis Wound #7: Same as Pre-Procedure Plan Follow-up Appointments: Return Appointment in 1 week. Margarita Grizzle on Wednesdays 06/04/22 @ 1:30 Rm # 8 Return Appointment in 2 weeks. Burman Blacksmith on Wednesday's Other: - ****ask Cardiology about fluid control in both legs- thighs and lower legs. Ensure to ask how much to drink fluids. Bathing/ Shower/ Hygiene: May shower with protection but do not get wound dressing(s) wet. Protect dressing(s) with water repellant cover (for example, large plastic bag) or a cast cover and may then take shower. Edema Control - Lymphedema / SCD / Other: Lymphedema Pumps. Use Lymphedema pumps on leg(s) 2-3 times a day for 45-60 minutes. If wearing any wraps or hose, do not remove them. Continue exercising as instructed. Elevate legs to the level of the heart or above for 30 minutes daily and/or  when sitting for 3-4 times a day throughout the day. Avoid standing for long periods of time. Exercise regularly WOUND #6: - Lower Leg Wound Laterality: Right, Medial Cleanser: Soap and Water 1 x Per Week/7 Days Discharge Instructions: May shower and wash wound with dial antibacterial soap and water prior to dressing change. Peri-Wound Care: Sween Lotion (Moisturizing lotion) 1 x Per Week/7 Days Discharge Instructions: Apply moisturizing lotion as directed Prim Dressing: Hydrofera Blue Classic Foam, 4x4 in 1 x Per Week/7 Days ary Discharge Instructions: Moisten with saline prior to applying to wound bed Secondary Dressing: ABD Pad, 5x9 1 x Per Week/7 Days Discharge Instructions: Apply over primary dressing as directed. Secondary Dressing: Woven Gauze Sponge, Non-Sterile 4x4 in 1 x Per Week/7 Days Discharge Instructions: Apply over primary dressing as directed. Com pression Wrap: FourPress (4 layer compression wrap) 1 x Per Week/7 Days Discharge Instructions: Apply four layer compression as directed. May also use Miliken CoFlex 2 layer compression system as alternative. WOUND #7: - Lower Leg Wound Laterality: Left, Medial Cleanser: Soap and Water 1 x Per Week/7 Days Discharge Instructions: May shower and wash wound with dial antibacterial soap and water prior to dressing change. Peri-Wound Care: Sween Lotion (Moisturizing lotion) 1 x Per Week/7 Days Discharge Instructions: Apply moisturizing lotion as directed Prim Dressing: Hydrofera Blue Classic Foam, 4x4 in 1 x Per Week/7 Days ary Discharge Instructions: Moisten with saline prior to applying to wound bed Secondary Dressing: ABD Pad, 5x9 1 x Per Week/7 Days Discharge Instructions: Apply over primary dressing as directed. Secondary Dressing: Woven Gauze Sponge, Non-Sterile 4x4 in 1 x Per Week/7 Days Discharge Instructions: Apply over primary dressing as directed. Com pression Wrap: FourPress (4 layer compression wrap) 1 x Per Week/7  Days Discharge Instructions: Apply four layer compression as directed. May also use Miliken CoFlex 2 layer compression system as alternative. 1. I am going to recommend that we have the patient continue to monitor for any signs of worsening or infection. Obviously based on exam right now think the compression wrapping is still the best way to go also believe that it would be optimal for the patient to continue currently with [facility] although we can use classic is that already has a ready was getting stuff to him quite significantly. 2. I am also can recommend the patient should continue with the 4-layer compression wrap specifically. 3. I would also recommend ABD pad to cover over top of the [facility]. We will see patient back for reevaluation in 1 week here  in the clinic. If anything worsens or changes patient will contact our office for additional recommendations. Electronic Signature(s) Signed: 05/28/2022 2:09:24 PM By: [name] Keeler PA-C Entered By: [name] on 05/28/2022 14:09:24 -------------------------------------------------------------------------------- SuperBill Details Patient Name: Date of Service: [name], [name] 05/28/2022 Medical Record Number: [id number] Patient Account Number: [id number] Date of Birth/Sex: Treating RN: [birth year] (77 y.o. M) Primary Care Provider: Birdie [name] Other Clinician: Referring Provider: Treating Provider/Extender: [name] Weeks in Treatment: 9 Diagnosis Coding Defoor, [name] ([id number]) [id number] Page 10 of 10 ICD-10 Codes Code Description [phone] Non-pressure chronic ulcer of other part of left lower leg with other specified severity 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 other part of right lower leg with other specified severity I89.0 Lymphedema, not elsewhere classified Facility Procedures : CPT4 Code: [id number] Description: [id number] - DEB SUBQ TISSUE 20 SQ CM/<  ICD-10 Diagnosis Description L97.828 Non-pressure chronic ulcer of other part of left lower leg with other specified s 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 other part of right lower leg with other  specified Modifier: everity severity Quantity: 1 : CPT4 Code: [id number] Description: [id number] - DEB SUBQ TISS EA ADDL 20CM ICD-10 Diagnosis Description L97.828 Non-pressure chronic ulcer of other part of left lower leg with other specified s 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 other part of right lower leg with other  specified Modifier: everity severity Quantity: 1 Physician Procedures : CPT4 Code Description Modifier [id number] - WC PHYS SUBQ TISS 20 SQ CM ICD-10 Diagnosis Description 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 Quantity: 1 : [id number] - WC PHYS SUBQ TISS EA ADDL 20 CM ICD-10 Diagnosis Description 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 other part of right lower leg with other  specified severity Quantity: 1 Electronic Signature(s) Signed: 05/28/2022 2:09:47 PM By: [name] Keeler PA-C Entered By: [name] on 05/28/2022 14:09:47

## 2022-07-09 ENCOUNTER — Encounter (HOSPITAL_BASED_OUTPATIENT_CLINIC_OR_DEPARTMENT_OTHER): Payer: Medicare Other | Admitting: Physician Assistant

## 2022-07-09 DIAGNOSIS — I87333 Chronic venous hypertension (idiopathic) with ulcer and inflammation of bilateral lower extremity: Secondary | ICD-10-CM | POA: Diagnosis not present

## 2022-07-10 NOTE — Progress Notes (Addendum)
[name], [name] ([id number]) [id number] Page 1 of 9 Visit Report for 07/09/2022 Chief Complaint Document Details Patient Name: Date of Service: [name], [name] 07/09/2022 12:45 PM Medical Record Number: [id number] Patient Account Number: [id number] Date of Birth/Sex: Treating RN: [birth year] (77 y.o. M) Primary Care Provider: [name] Other Clinician: Referring Provider: Treating Provider/Extender: [name] Weeks in Treatment: 15 Information Obtained from: Patient Chief Complaint 02/23/2020; patient is here for wounds on his bilateral lower legs in the setting of severe lymphedema 03/26/2022; patient is here for wounds on his bilateral lower legs medial aspect Electronic Signature(s) Signed: 07/09/2022 1:06:31 PM By: Worthy Keeler PA-C Entered By: Worthy Keeler on 07/09/2022 13:06:31 -------------------------------------------------------------------------------- HPI Details Patient Name: Date of Service: [name] 07/09/2022 12:45 PM Medical Record Number: [id number] Patient Account Number: [id number] Date of Birth/Sex: Treating RN: 05-Jul-1945 (77 y.o. M) Primary Care Provider: [name] Other Clinician: Referring Provider: Treating Provider/Extender: [name] Weeks in Treatment: 15 History of Present Illness HPI Description: ADMISSION 02/23/2020 Patient is a 77 year old man who lives in [place] who arrives accompanied by his wife. He has a history of chronic lymphedema and venous insufficiency in his bilateral lower legs which may have something to do that with having a history of DVT as well as being treated for prostate cancer. In any case he recently got compression pumps at home but compliance has been an issue here. He has compression stockings however they are probably not sufficient enough to control swelling. They tell us that things deteriorated for him in late August he was  admitted to [facility] for 7 days. This was with cellulitis I think of his bilateral lower legs. Discharge he was noted to have wounds on his bilateral lower legs. He was discharged on Bactrim. They tried to get him home health through [facility] of course they declined him. His wife is been wrapping these applying some form of silver foam dressing. He has a history of wounds before although nothing that would not heal with basic home topical dressings. He has 2 areas on the left medial, left anterior and left lateral and a smaller area on the right medial. All of these have considerable depth. Past medical history includes iron deficiency anemia, lymphedema followed by the rehab center at [facility] with lymphedema wraps I believe, DVT on chronic anticoagulation, prostate cancer, chronic venous insufficiency, hypertension. As mentioned he has compression pumps but does not use them. ABIs in our clinic were noncompressible bilaterally 10/14; patient with severe bilateral lymphedema right greater than left. He came in with bilateral lower extremity wounds left greater than right. Even though the right side has more of the edema most of the wounds here almost closed on the right medial. He has 3 remaining wounds on the left We have been using silver alginate under 4-layer compression I have been trying to get him to be compliant with his external compression pumps 10/21; patient with 3 small wounds on the left leg and 1 on the right medial in the setting of severe lymphedema and chronic venous insufficiency. We have been using silver alginate under 4-layer compression he is using his external compression pumps twice a day 11/4; ARTERIAL STUDIES on the right show an ABI of 1.02 TBI of 0.858 with biphasic waveforms on the left 0.98 with a TBI of 0.55 and biphasic waveforms. Does not look like he has significant arterial disease. We are treating  him for lymphedema he has compression  pumps. He has punched-out areas on the left anterior left lateral and right medial lower extremities 11/11; after we obtained his arterial studies I put him in 4 layer compression. He is using his compression pumps probably once a day although I have asked [name] ([id number]) [phone].pdf Page 2 of 9 him to do twice. Primary dressing to the wound is silver collagen he has severe lymphedema likely secondary to chronic venous insufficiency. Wounds on the left lateral, left medial and left anterior and a small area on the right medial 12/2; the area on the right anterior lower leg has healed. We initially thought that the area medially had healed as well however when her discharge nurse came in she detected fluid in the wound simply opened up. This is actually worse than I remember this pain. The area on the left lateral potentially slightly smaller He is also complaining about pain in his left hand he says that this is actually been getting some better he has been using topical creams on this. She asked that I look at this 12/9 after last weeks issues we have 2 wounds one on the right medial lower leg and 1 on the left lateral. Both of these are in the same condition. I think because of thickened skin secondary to chronic lymphedema these wounds actually have depth of almost 0.8 cm. 12/16; the patient has 2 small but deep wounds one on the right medial and one on the left lateral. The right medial is actually the worst of these. He arrives in clinic today with absolutely terrible edema in the right leg apparently his 4-layer wrap fell down to just above his ankle he did not think about this he is apparently been continuing to use his compression pump twice a day. The left leg looks a lot better. 05/09/2020 upon evaluation today patient appears to be doing decently well in regard to his wounds. Everything is measuring smaller the right leg still has a little bit deeper  wound in the left seems to be almost completely healed in my opinion I am very pleased in general with how things are progressing. He has a 4- layer compression wrap we have been using endoform today we will probably have to use collagen just based on the fact that we do not have endoform it is on order. 1/6; the patient's wound on the left lateral lower leg has healed. Still has 1 on the right medial. 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 Using compression pumps at home twice a day. 1/13; left lateral lower leg is still healed. He has a deep punched out rectangular shaped wound on the right medial calf. Looking down at this it appears that he is attempting to epithelialize around the edges of the wound and on the base as well. His edema is reasonably well controlled we have been using collagen with absolutely no effect 1/20; left lateral lower leg remains closed he has extremitease stockings. The area on the right medial calf I aggressively debrided last week measures larger but the surface looks better. We have been using Hydrofera Blue. We ran Oasis through his insurance but we have not seen the results of this 1/27; left lower leg wound with chronic venous insufficiency and secondary lymphedema. I did aggressive debridement on this last week the wound seems to have come in healthy looking surface using Hydrofera Blue. He was denied for Oasis 2/3; small divot in the right medial  lower leg. Under illumination the walls of this divot are epithelialized however the base has slough which I removed with a curette we have been using Hydrofera Blue 2/10 small divot on the right medial lower leg pinpoint illumination at the base of this cone-shaped wound. We have been using Hydrofera Blue but I will switch to calcium alginate this week 2/17; the small divot on the right medial lower leg is fully epithelialized. There is no visible open area under illumination. He has his own  stocking for the right leg similar to the one he has been wearing on the left. 03/26/2022; READMISSION This is a now 77 year old man that we had in the clinic from 02/23/2020 through 07/05/2020. At that point he had bilateral lower extremity wounds left greater than right in the setting of severe lymphedema. He had already obtained compression pumps ordered for him I think from the wound care clinic in [facility] so I do not really have record of what he has been using. He claims to be using them once a day but there is a problem with the sleeve on the left leg. About 2 weeks ago he was hospitalized from 03/11/2022 through [id number] with diastolic congestive heart failure. 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 He was diuresed. Developed some prerenal azotemia and he has not been taking any diuretics currently. He has not been putting stockings on his legs since he got out of hospital and still has his legs dependent for long periods. Past medical history history of prostate cancer treated with prostatectomy and radiation this was apparently about 8 years ago, history of DVT on chronic Coumadin, history of lymphedema was managed for a while at the clinic in [place]. History of inguinal hernia repair in September 22, hypertension, stage IIIb chronic renal failure ABIs today were noncompressible on the right 1.12 on the left 04-02-2022 upon evaluation today patient appears to be doing well currently in regard to his legs I do feel like both areas that are draining are actually much drier than they were in the picture last week although the left is drier than the right. He is tolerating the 4-layer compression wraps at this point he did contact the pump company and they are actually working on getting him a new compression sleeve for one of his legs which have previously popped and was not functioning properly. 04-23-2022 upon evaluation today  patient appears to be doing well currently in regard to his wounds on the legs. I am actually very pleased with where things stand and I do feel like that we are headed in the right direction. Fortunately there is no sign of active infection locally or systemically at this time. 05-07-2022 upon evaluation today patient appears to be doing well currently in regard to his wounds in fact things are showing signs of improvement which is good news I do not see too much that actually appears to be open and I am very pleased in that regard. No fevers, chills, nausea, vomiting, or diarrhea. 05-21-2022 upon evaluation today patient appears to be doing somewhat poorly in regard to drainage of his lower extremities bilaterally. The right is greater than left as far as the weeping area. Nonetheless it seems to be getting worse not better. He actually has pitting edema which is at least 2+ to the thighs and I am concerned about the fact that he is may be fluid overloaded in general and that is the  reason why we cannot get this under control. I know he is not using his pumps all the time because he actually told the nurse that he was either going to pump or he was going to use his fluid pills but not do both. For that reason I do think that he needs to be really doing both in order to get the fluid out as effectively as possible obviously with the 4-layer compression wraps were doing as much as we can from a compression standpoint but it is really not enough. He tells me that he elevates his leg is much as he can in between pumping and other activity throughout the day. 05-28-2022 upon evaluation today patient appears to be doing better in regard to his wounds although the measurements may be a little bit larger this is a very difficult wound to heal it is very indistinct in a lot of areas. Nonetheless there is can be some need for sharp debridement in regard to both medial and lateral legs. Fortunately I see no signs  of active infection locally nor systemically at this time. No fevers, chills, nausea, vomiting, or diarrhea. 06-04-2022 upon evaluation today patient appears to be doing poorly in general in regard to the wounds on his legs. He still continues to have a tremendous amount of fluid not just in the lower portion of his leg but to be honest his thigh where he has 2-3+ pitting edema in the thigh as well. Unfortunately I do not know that we will be able to get this healed effectively and keep it healed on the lower extremities unless he gets the overall fluid situation taking and under control. Fortunately I do not see any signs of infection locally nor systemically which is great news. He just seems to be very fluid overloaded. 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 He has been tolerating the dressing changes with the compression wraps without complication. Fortunately I do not see any evidence of infection at this time which is great news. No fevers, chills, nausea, vomiting, or diarrhea. 06-18-2022 upon evaluation today patient appears to be doing well currently in regard to his wounds as far as not looking like they are terribly infected but nonetheless I am concerned about a subacute infection secondary to the fact that he continues to have spreading despite the compression therapy. We actually did do an Unna boot on him last week this is actually the first wrap that actually stayed up everything else has been sliding down quite significantly. Fortunately there does not appear to be any signs of infection systemically at this time. With that being said I do believe that locally there seems to be an issue going on here and again I Georgina Peer do a PCR culture to see what that shows also think that I am going to put him on a broad-spectrum antibiotic, doxycycline to see how that will help as well.  He does tell me that coming into the clinic today that he was feeling short of breath like "he was about to have a heart attack" because he was having such a hard time breathing. He says that he told this to Dr. Debbora Dus his cardiologist as well when he was evaluated in the [place], [facility] ([id number]) [phone].pdf Page 3 of 9 past 1 to 2 weeks. 07-02-2022 upon evaluation today patient appears to be doing poorly currently in regard to his wound. He has been tolerating the  dressing changes. Unfortunately he has not had any compression wraps on for the past week because he was unable to make it in for his appointment last week. With that being said he has a significant amount of drainage he tells me has been using his pumps but despite this in the pumps he still has been draining quite a bit. The drainage is also somewhat purulent unfortunately. We did attempt to get in touch with his cardiologist last week unfortunately we were unable to get up with him I did advise that the patient needs to get in touch with him upon leaving today in order to make sure they know he is on the new antibiotics I am going to send him this will be Levaquin and Augmentin. 07-09-2022 upon evaluation today patient appears to be doing about the same in regard to his legs he may have just a slight amount of improvement with regard to the drainage probably [place] topical antibiotics are helping in this regard to some degree. Fortunately there does not appear to be any signs of active infection systemically which is great news. No fevers, chills, nausea, vomiting, or diarrhea. Electronic Signature(s) Signed: 07/09/2022 2:22:06 PM By: Worthy Keeler PA-C Entered By: Worthy Keeler on 07/09/2022 14:22:06 -------------------------------------------------------------------------------- Physical Exam Details Patient Name: Date of Service: [name] 07/09/2022 12:45 PM Medical Record Number:  [id number] Patient Account Number: [id number] Date of Birth/Sex: Treating RN: Mar 24, 1946 (77 y.o. M) Primary Care Provider: [name] Other Clinician: Referring Provider: Treating Provider/Extender: [name] Weeks in Treatment: 15 Constitutional Obese and well-hydrated in no acute distress. Respiratory normal breathing without difficulty. Psychiatric this patient is able to make decisions and demonstrates good insight into disease process. Alert and Oriented x 3. pleasant and cooperative. Notes Upon inspection patient's wound bed actually showed signs of good granulation epithelization at this point. I do think that this is more of a skin breakdown from drainage and weeping than anything else. With I discussed this with him and his wife as well. Fortunately I do not see any signs that things are worsening I do believe he should take the Levaquin we did discuss the small risk of there being a concern for Achilles tendon rupture but again concerning the infection I believe the risk Of leaving infection going control Treated is greater than the risk of the tendon rupture to be perfectly honest. Nonetheless in the end it is definitely up to him. Electronic Signature(s) Signed: 07/09/2022 2:23:05 PM By: Worthy Keeler PA-C Entered By: Worthy Keeler on 07/09/2022 14:23:05 -------------------------------------------------------------------------------- Physician Orders Details Patient Name: Date of Service: [name] 07/09/2022 12:45 PM Medical Record Number: [id number] Patient Account Number: [id number] Date of Birth/Sex: Treating RN: Feb 17, 1946 (77 y.o. [name] Primary Care Provider: [name] Other Clinician: Referring Provider: Treating Provider/Extender: [name] Weeks in Treatment: 15 Verbal / Phone Orders: No Diagnosis Coding ICD-10 Coding Code Description Stogner, [name] ([id number])  [phone].pdf Page 4 of 9 [phone] Chronic venous hypertension (idiopathic) with ulcer and inflammation of bilateral lower extremity I89.0 Lymphedema, not elsewhere classified 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 other part of right lower leg with other specified severity Follow-up Appointments ppointment in 1 week. - Lanae Crumbly Wednesdays 07/16/2022 215pm Return A ppointment in 2 weeks. Margarita Grizzle Wednesday 07/23/2022 130pm Return A Other: - Will call you cardiologist/Primary Care  Provider to make aware of your shortness of breath with exertion, fluid overload in both legs, and elevated blood pressure. ****Please bring the topical antibiotics at each visit.**** ***Advise you to take the oral antibiotics.***** Call [employer] company about your compression garments. Brochure provided and given. Bathing/ Shower/ Hygiene May shower with protection but do not get wound dressing(s) wet. Protect dressing(s) with water repellant cover (for example, large plastic bag) or a cast cover and may then take shower. Edema Control - Lymphedema / SCD / Other Lymphedema Pumps. Use Lymphedema pumps on leg(s) 2-3 times a day for 45-60 minutes. If wearing any wraps or hose, do not remove them. Continue exercising as instructed. Elevate legs to the level of the heart or above for 30 minutes daily and/or when sitting for 3-4 times a day throughout the day. A void standing for long periods of time. Exercise regularly If compression wraps slide down please call wound center and speak with a nurse. Wound Treatment Wound #6 - Lower Leg Wound Laterality: Right, Medial Cleanser: Soap and Water 1 x Per Week/7 Days Discharge Instructions: May shower and wash wound with dial antibacterial soap and water prior to dressing change. Peri-Wound Care: Sween Lotion (Moisturizing lotion) 1 x Per Week/7  Days Discharge Instructions: Apply moisturizing lotion as directed Prim Dressing: Cutimed Sorbact Swab 1 x Per Week/7 Days ary Discharge Instructions: Apply to wound bed as instructed Prim Dressing: Sorbalgon Dressing 4x4 (in/in) 1 x Per Week/7 Days ary Discharge Instructions: Apply to wound bed as instructed Prim Dressing: Compounding topical antibiotics- [place] 1 x Per Week/7 Days ary Discharge Instructions: apply under alignate Ag when patient brings with appts. Secondary Dressing: ABD Pad, 5x9 1 x Per Week/7 Days Discharge Instructions: Apply over primary dressing as directed. Secondary Dressing: Woven Gauze Sponge, Non-Sterile 4x4 in 1 x Per Week/7 Days Discharge Instructions: Apply over primary dressing as directed. Compression Wrap: Urgo K2, two layer compression system, regular 1 x Per Week/7 Days Discharge Instructions: Apply Urgo K2 as directed (alternative to 4 layer compression). Wound #7 - Lower Leg Wound Laterality: Left, Circumferential Cleanser: Soap and Water 1 x Per Week/7 Days Discharge Instructions: May shower and wash wound with dial antibacterial soap and water prior to dressing change. Peri-Wound Care: Sween Lotion (Moisturizing lotion) 1 x Per Week/7 Days Discharge Instructions: Apply moisturizing lotion as directed Prim Dressing: Cutimed Sorbact Swab 1 x Per Week/7 Days ary Discharge Instructions: Apply to wound bed as instructed Prim Dressing: Sorbalgon Dressing 4x4 (in/in) 1 x Per Week/7 Days ary Discharge Instructions: Apply to wound bed as instructed Prim Dressing: Compounding topical antibiotics- [place] 1 x Per Week/7 Days ary Discharge Instructions: apply under alignate Ag when patient brings with appts. Secondary Dressing: ABD Pad, 5x9 1 x Per Week/7 Days Discharge Instructions: Apply over primary dressing as directed. Secondary Dressing: Woven Gauze Sponge, Non-Sterile 4x4 in 1 x Per Week/7 Days Discharge Instructions: Apply  over primary dressing as directed. Compression Wrap: Urgo K2, two layer compression system, regular 1 x Per Week/7 Days Discharge Instructions: Apply Urgo K2 as directed (alternative to 4 layer compression). [name] ([id number]) [id number] Page 5 of 9 Electronic Signature(s) Signed: 07/09/2022 5:20:51 PM By: [name], [name] Signed: 07/09/2022 5:24:38 PM By: Worthy Keeler PA-C Entered By: Deon Pilling on 07/09/2022 13:56:49 -------------------------------------------------------------------------------- Problem List Details Patient Name: Date of Service: [name] 07/09/2022 12:45 PM Medical Record Number: [id number] Patient Account Number: [id number] Date of Birth/Sex: Treating RN: June 24, 1945 (77 y.o. M) Primary  Care Provider: [name] Other Clinician: Referring Provider: Treating Provider/Extender: [name] in Treatment: 15 Active Problems ICD-10 Encounter Code Description Active Date MDM Diagnosis I87.333 Chronic venous hypertension (idiopathic) with ulcer and inflammation of 06/11/2022 No Yes bilateral lower extremity I89.0 Lymphedema, not elsewhere classified 03/26/2022 No Yes L97.828 Non-pressure chronic ulcer of other part of left lower leg with other specified 03/26/2022 No Yes severity 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 other part of right lower leg with other specified 03/26/2022 No Yes severity Inactive Problems Resolved Problems Electronic Signature(s) Signed: 07/09/2022 1:06:24 PM By: Worthy Keeler PA-C Entered By: Worthy Keeler on 07/09/2022 13:06:24 -------------------------------------------------------------------------------- Progress Note Details Patient Name: Date of Service: [name], [name] 07/09/2022 12:45 PM Medical Record Number: [id number] Patient Account Number: [id number] Date of Birth/Sex: Treating RN: December 22, 1945 (77 y.o. M) Primary Care Provider: [name]  Other Clinician: Referring Provider: Treating Provider/Extender: [name] Weeks in Treatment: 15 Subjective [name] ([id number]) [id number] Page 6 of 9 Chief Complaint Information obtained from Patient 02/23/2020; patient is here for wounds on his bilateral lower legs in the setting of severe lymphedema 03/26/2022; patient is here for wounds on his bilateral lower legs medial aspect History of Present Illness (HPI) ADMISSION 02/23/2020 Patient is a 77 year old man who lives in [place] who arrives accompanied by his wife. He has a history of chronic lymphedema and venous insufficiency in his bilateral lower legs which may have something to do that with having a history of DVT as well as being treated for prostate cancer. In any case he recently got compression pumps at home but compliance has been an issue here. He has compression stockings however they are probably not sufficient enough to control swelling. They tell us that things deteriorated for him in late August he was admitted to [facility] for 7 days. This was with cellulitis I think of his bilateral lower legs. Discharge he was noted to have wounds on his bilateral lower legs. He was discharged on Bactrim. They tried to get him home health through [facility] of course they declined him. His wife is been wrapping these applying some form of silver foam dressing. He has a history of wounds before although nothing that would not heal with basic home topical dressings. He has 2 areas on the left medial, left anterior and left lateral and a smaller area on the right medial. All of these have considerable depth. Past medical history includes iron deficiency anemia, lymphedema followed by the rehab center at [facility] with lymphedema wraps I believe, DVT on chronic anticoagulation, prostate cancer, chronic venous insufficiency, hypertension. As mentioned he has  compression pumps but does not use them. ABIs in our clinic were noncompressible bilaterally 10/14; patient with severe bilateral lymphedema right greater than left. He came in with bilateral lower extremity wounds left greater than right. Even though the right side has more of the edema most of the wounds here almost closed on the right medial. He has 3 remaining wounds on the left We have been using silver alginate under 4-layer compression I have been trying to get him to be compliant with his external compression pumps 10/21; patient with 3 small wounds on the left leg and 1 on the right medial in the setting of severe lymphedema and chronic venous insufficiency. We have been using silver alginate under 4-layer compression he is using his external compression pumps twice a day 11/4; ARTERIAL  STUDIES on the right show an ABI of 1.02 TBI of 0.858 with biphasic waveforms on the left 0.98 with a TBI of 0.55 and biphasic waveforms. Does not look like he has significant arterial disease. We are treating him for lymphedema he has compression pumps. He has punched-out areas on the left anterior left lateral and right medial lower extremities 11/11; after we obtained his arterial studies I put him in 4 layer compression. He is using his compression pumps probably once a day although I have asked him to do twice. Primary dressing to the wound is silver collagen he has severe lymphedema likely secondary to chronic venous insufficiency. Wounds on the left lateral, left medial and left anterior and a small area on the right medial 12/2; the area on the right anterior lower leg has healed. We initially thought that the area medially had healed as well however when her discharge nurse came in she detected fluid in the wound simply opened up. This is actually worse than I remember this pain. The area on the left lateral potentially slightly smaller He is also complaining about pain in his left hand he says that  this is actually been getting some better he has been using topical creams on this. She asked that I look at this 12/9 after last weeks issues we have 2 wounds one on the right medial lower leg and 1 on the left lateral. Both of these are in the same condition. I think because of thickened skin secondary to chronic lymphedema these wounds actually have depth of almost 0.8 cm. 12/16; the patient has 2 small but deep wounds one on the right medial and one on the left lateral. The right medial is actually the worst of these. He arrives in clinic today with absolutely terrible edema in the right leg apparently his 4-layer wrap fell down to just above his ankle he did not think about this he is apparently been continuing to use his compression pump twice a day. The left leg looks a lot better. 05/09/2020 upon evaluation today patient appears to be doing decently well in regard to his wounds. Everything is measuring smaller the right leg still has a little bit deeper wound in the left seems to be almost completely healed in my opinion I am very pleased in general with how things are progressing. He has a 4- layer compression wrap we have been using endoform today we will probably have to use collagen just based on the fact that we do not have endoform it is on order. 1/6; the patient's wound on the left lateral lower leg has healed. Still has 1 on the right medial. 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 Using compression pumps at home twice a day. 1/13; left lateral lower leg is still healed. He has a deep punched out rectangular shaped wound on the right medial calf. Looking down at this it appears that he is attempting to epithelialize around the edges of the wound and on the base as well. His edema is reasonably well controlled we have been using collagen with absolutely no effect 1/20; left lateral lower leg remains closed he has extremitease stockings. The area on the right medial  calf I aggressively debrided last week measures larger but the surface looks better. We have been using Hydrofera Blue. We ran Oasis through his insurance but we have not seen the results of this 1/27; left lower leg wound with chronic venous insufficiency and secondary lymphedema. I did  aggressive debridement on this last week the wound seems to have come in healthy looking surface using Hydrofera Blue. He was denied for Oasis 2/3; small divot in the right medial lower leg. Under illumination the walls of this divot are epithelialized however the base has slough which I removed with a curette we have been using Hydrofera Blue 2/10 small divot on the right medial lower leg pinpoint illumination at the base of this cone-shaped wound. We have been using Hydrofera Blue but I will switch to calcium alginate this week 2/17; the small divot on the right medial lower leg is fully epithelialized. There is no visible open area under illumination. He has his own stocking for the right leg similar to the one he has been wearing on the left. 03/26/2022; READMISSION This is a now 77 year old man that we had in the clinic from 02/23/2020 through 07/05/2020. At that point he had bilateral lower extremity wounds left greater than right in the setting of severe lymphedema. He had already obtained compression pumps ordered for him I think from the wound care clinic in [facility] so I do not really have record of what he has been using. He claims to be using them once a day but there is a problem with the sleeve on the left leg. About 2 weeks ago he was hospitalized from 03/11/2022 through [id number] with diastolic congestive heart failure. 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 He was diuresed. Developed some prerenal azotemia and he has not been taking any diuretics currently. He has not been putting stockings on his legs since he got out of hospital and still  has his legs dependent for long periods. Past medical history history of prostate cancer treated with prostatectomy and radiation this was apparently about 8 years ago, history of DVT on chronic Coumadin, history of lymphedema was managed for a while at the clinic in [place]. History of inguinal hernia repair in September 22, hypertension, stage IIIb chronic renal failure ABIs today were noncompressible on the right 1.12 on the left 04-02-2022 upon evaluation today patient appears to be doing well currently in regard to his legs I do feel like both areas that are draining are actually much [name], [name] ([id number]) [phone].pdf Page 7 of 9 drier than they were in the picture last week although the left is drier than the right. He is tolerating the 4-layer compression wraps at this point he did contact the pump company and they are actually working on getting him a new compression sleeve for one of his legs which have previously popped and was not functioning properly. 04-23-2022 upon evaluation today patient appears to be doing well currently in regard to his wounds on the legs. I am actually very pleased with where things stand and I do feel like that we are headed in the right direction. Fortunately there is no sign of active infection locally or systemically at this time. 05-07-2022 upon evaluation today patient appears to be doing well currently in regard to his wounds in fact things are showing signs of improvement which is good news I do not see too much that actually appears to be open and I am very pleased in that regard. No fevers, chills, nausea, vomiting, or diarrhea. 05-21-2022 upon evaluation today patient appears to be doing somewhat poorly in regard to drainage of his lower extremities bilaterally. The right is greater than left as far as the weeping area. Nonetheless it seems to  be getting worse not better. He actually has pitting edema which is at least 2+  to the thighs and I am concerned about the fact that he is may be fluid overloaded in general and that is the reason why we cannot get this under control. I know he is not using his pumps all the time because he actually told the nurse that he was either going to pump or he was going to use his fluid pills but not do both. For that reason I do think that he needs to be really doing both in order to get the fluid out as effectively as possible obviously with the 4-layer compression wraps were doing as much as we can from a compression standpoint but it is really not enough. He tells me that he elevates his leg is much as he can in between pumping and other activity throughout the day. 05-28-2022 upon evaluation today patient appears to be doing better in regard to his wounds although the measurements may be a little bit larger this is a very difficult wound to heal it is very indistinct in a lot of areas. Nonetheless there is can be some need for sharp debridement in regard to both medial and lateral legs. Fortunately I see no signs of active infection locally nor systemically at this time. No fevers, chills, nausea, vomiting, or diarrhea. 06-04-2022 upon evaluation today patient appears to be doing poorly in general in regard to the wounds on his legs. He still continues to have a tremendous amount of fluid not just in the lower portion of his leg but to be honest his thigh where he has 2-3+ pitting edema in the thigh as well. Unfortunately I do not know that we will be able to get this healed effectively and keep it healed on the lower extremities unless he gets the overall fluid situation taking and under control. Fortunately I do not see any signs of infection locally nor systemically which is great news. He just seems to be very fluid overloaded. 06-11-2022 upon evaluation today patient presents for follow-up concerning his bilateral lower extremity lymphedema secondary to chronic  venous insufficiency. He has been tolerating the dressing changes with the compression wraps without complication. Fortunately I do not see any evidence of infection at this time which is great news. No fevers, chills, nausea, vomiting, or diarrhea. 06-18-2022 upon evaluation today patient appears to be doing well currently in regard to his wounds as far as not looking like they are terribly infected but nonetheless I am concerned about a subacute infection secondary to the fact that he continues to have spreading despite the compression therapy. We actually did do an Unna boot on him last week this is actually the first wrap that actually stayed up everything else has been sliding down quite significantly. Fortunately there does not appear to be any signs of infection systemically at this time. With that being said I do believe that locally there seems to be an issue going on here and again I Georgina Peer do a PCR culture to see what that shows also think that I am going to put him on a broad-spectrum antibiotic, doxycycline to see how that will help as well. He does tell me that coming into the clinic today that he was feeling short of breath like "he was about to have a heart attack" because he was having such a hard time breathing. He says that he told this to Dr. Debbora Dus his cardiologist as well when he  was evaluated in the past 1 to 2 weeks. 07-02-2022 upon evaluation today patient appears to be doing poorly currently in regard to his wound. He has been tolerating the dressing changes. Unfortunately he has not had any compression wraps on for the past week because he was unable to make it in for his appointment last week. With that being said he has a significant amount of drainage he tells me has been using his pumps but despite this in the pumps he still has been draining quite a bit. The drainage is also somewhat purulent unfortunately. We did attempt to get in touch with his cardiologist last week  unfortunately we were unable to get up with him I did advise that the patient needs to get in touch with him upon leaving today in order to make sure they know he is on the new antibiotics I am going to send him this will be Levaquin and Augmentin. 07-09-2022 upon evaluation today patient appears to be doing about the same in regard to his legs he may have just a slight amount of improvement with regard to the drainage probably [place] topical antibiotics are helping in this regard to some degree. Fortunately there does not appear to be any signs of active infection systemically which is great news. No fevers, chills, nausea, vomiting, or diarrhea. Objective Constitutional Obese and well-hydrated in no acute distress. Vitals Time Taken: 12:50 PM, Height: 74 in, Weight: 250 lbs, BMI: 32.1, Temperature: 97.8 F, Pulse: 92 bpm, Respiratory Rate: 20 breaths/min, Blood Pressure: 163/79 mmHg. Respiratory normal breathing without difficulty. Psychiatric this patient is able to make decisions and demonstrates good insight into disease process. Alert and Oriented x 3. pleasant and cooperative. General Notes: Upon inspection patient's wound bed actually showed signs of good granulation epithelization at this point. I do think that this is more of a skin breakdown from drainage and weeping than anything else. With I discussed this with him and his wife as well. Fortunately I do not see any signs that things are worsening I do believe he should take the Levaquin we did discuss the small risk of there being a concern for Achilles tendon rupture but again concerning the infection I believe the risk Of leaving infection going control Treated is greater than the risk of the tendon rupture to be perfectly honest. Nonetheless in the end it is definitely up to him. Integumentary (Hair, Skin) Wound #6 status is Open. Original cause of wound was Gradually Appeared. The date acquired was: 03/05/2022. The wound has  been in treatment 15 weeks. The wound is located on the Right,Medial Lower Leg. The wound measures 17cm length x 49cm width x 0.1cm depth; 654.237cm^2 area and 65.424cm^3 volume. There is Fat Layer (Subcutaneous Tissue) exposed. There is no tunneling or undermining noted. There is a large amount of serosanguineous drainage noted. The wound margin is distinct with the outline attached to the wound base. There is small (1-33%) pink granulation within the wound bed. There is a large (67-100%) amount of necrotic tissue within the wound bed including Adherent Slough. The periwound skin appearance exhibited: Maceration. The periwound skin appearance did not exhibit: Callus, Crepitus, Excoriation, Induration, Rash, Scarring, Dry/Scaly, Atrophie Blanche, Cyanosis, Ecchymosis, Hemosiderin Staining, Mottled, Pallor, Rubor, Erythema. Wound #7 status is Open. Original cause of wound was Gradually Appeared. The date acquired was: 03/05/2022. The wound has been in treatment 15 weeks. [name] ([id number]) [id number] Page 8 of 9 The wound is located on the Left,Circumferential Lower Leg. The wound  measures 7cm length x 47cm width x 0.1cm depth; 258.396cm^2 area and 25.84cm^3 volume. There is Fat Layer (Subcutaneous Tissue) exposed. There is no tunneling or undermining noted. There is a medium amount of serosanguineous drainage noted. The wound margin is distinct with the outline attached to the wound base. There is large (67-100%) pink, pale granulation within the wound bed. There is a small (1-33%) amount of necrotic tissue within the wound bed including Adherent Slough. The periwound skin appearance exhibited: Hemosiderin Staining. The periwound skin appearance did not exhibit: Callus, Crepitus, Excoriation, Induration, Rash, Scarring, Dry/Scaly, Maceration, Atrophie Blanche, Cyanosis, Ecchymosis, Mottled, Pallor, Rubor, Erythema. 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 There was a Four Layer Compression Therapy Procedure by Deon Pilling, RN. Post procedure Diagnosis Wound #6: Same as Pre-Procedure Wound #7 Pre-procedure diagnosis of Wound #7 is a Lymphedema located on the Left,Circumferential Lower Leg . There was a Four Layer Compression Therapy Procedure by Deon Pilling, RN. Post procedure Diagnosis Wound #7: Same as Pre-Procedure Plan Follow-up Appointments: Return Appointment in 1 week. - Lanae Crumbly Wednesdays 07/16/2022 215pm Return Appointment in 2 weeks. Margarita Grizzle Wednesday 07/23/2022 130pm Other: - Will call you cardiologist/Primary Care Provider to make aware of your shortness of breath with exertion, fluid overload in both legs, and elevated blood pressure. ****Please bring the topical antibiotics at each visit.**** ***Advise you to take the oral antibiotics.***** Call [employer] company about your compression garments. Brochure provided and given. Bathing/ Shower/ Hygiene: May shower with protection but do not get wound dressing(s) wet. Protect dressing(s) with water repellant cover (for example, large plastic bag) or a cast cover and may then take shower. Edema Control - Lymphedema / SCD / Other: Lymphedema Pumps. Use Lymphedema pumps on leg(s) 2-3 times a day for 45-60 minutes. If wearing any wraps or hose, do not remove them. Continue exercising as instructed. Elevate legs to the level of the heart or above for 30 minutes daily and/or when sitting for 3-4 times a day throughout the day. Avoid standing for long periods of  time. Exercise regularly If compression wraps slide down please call wound center and speak with a nurse. WOUND #6: - Lower Leg Wound Laterality: Right, Medial Cleanser: Soap and Water 1 x Per Week/7 Days Discharge Instructions: May shower and wash wound with dial antibacterial soap and water prior to dressing change. Peri-Wound Care: Sween Lotion (Moisturizing lotion) 1 x Per Week/7 Days Discharge Instructions: Apply moisturizing lotion as directed Prim Dressing: Cutimed Sorbact Swab 1 x Per Week/7 Days ary Discharge Instructions: Apply to wound bed as instructed Prim Dressing: Sorbalgon Dressing 4x4 (in/in) 1 x Per Week/7 Days ary Discharge Instructions: Apply to wound bed as instructed Prim Dressing: Compounding topical antibiotics- [place] 1 x Per Week/7 Days ary Discharge Instructions: apply under alignate Ag when patient brings with appts. Secondary Dressing: ABD Pad, 5x9 1 x Per Week/7 Days Discharge Instructions: Apply over primary dressing as directed. Secondary Dressing: Woven Gauze Sponge, Non-Sterile 4x4 in 1 x Per Week/7 Days Discharge Instructions: Apply over primary dressing as directed. Com pression Wrap: Urgo K2, two layer compression system, regular 1 x Per Week/7 Days Discharge  Instructions: Apply Urgo K2 as directed (alternative to 4 layer compression). WOUND #7: - Lower Leg Wound Laterality: Left, Circumferential Cleanser: Soap and Water 1 x Per Week/7 Days Discharge Instructions: May shower and wash wound with dial antibacterial soap and water prior to dressing change. Peri-Wound Care: Sween Lotion (Moisturizing lotion) 1 x Per Week/7 Days Discharge Instructions: Apply moisturizing lotion as directed Prim Dressing: Cutimed Sorbact Swab 1 x Per Week/7 Days ary Discharge Instructions: Apply to wound bed as instructed Prim Dressing: Sorbalgon Dressing 4x4 (in/in) 1 x Per Week/7 Days ary Discharge Instructions: Apply to wound bed as instructed Prim  Dressing: Compounding topical antibiotics- [place] 1 x Per [employer] ary Doubrava, [name] ([id number]) [phone].pdf Page 9 of 9 Discharge Instructions: apply under alignate Ag when patient brings with appts. Secondary Dressing: ABD Pad, 5x9 1 x Per Week/7 Days Discharge Instructions: Apply over primary dressing as directed. Secondary Dressing: Woven Gauze Sponge, Non-Sterile 4x4 in 1 x Per Week/7 Days Discharge Instructions: Apply over primary dressing as directed. Compression Wrap: Urgo K2, two layer compression system, regular 1 x Per Week/7 Days Discharge Instructions: Apply Urgo K2 as directed (alternative to 4 layer compression). 1. I am going to recommend currently that we have the patient continue to monitor for any signs of infection or worsening. Based on what I am seeing I do believe that he should still use the [facility] topical antibiotics and I think also utilizing the oral medication, Levaquin, would be ideal. 2. I am good recommend as well that we have the patient continue with ABD pads to cover regularly using Sorbact followed by silver alginate and then ABD pads currently. 3. I really do think he still needs the 4-layer compression wrap and I would recommend that we continue with that currently anything lesser I think this can be not enough to keep his edema under control. We will see patient back for reevaluation in 1 week here in the clinic. If anything worsens or changes patient will contact our office for additional recommendations. Electronic Signature(s) Signed: 07/09/2022 2:26:01 PM By: Worthy Keeler PA-C Entered By: Worthy Keeler on 07/09/2022 14:26:00 -------------------------------------------------------------------------------- SuperBill Details Patient Name: Date of Service: [name] 07/09/2022 Medical Record Number: [id number] Patient Account Number: [id number] Date of Birth/Sex: Treating RN: [birth year] (77 y.o. [name], [name] Primary Care Provider: [name] Other Clinician: Referring Provider: Treating Provider/Extender: [name] Weeks in Treatment: 15 Diagnosis Coding ICD-10 Codes Code Description [id number] Chronic venous hypertension (idiopathic) with ulcer and inflammation of bilateral lower extremity I89.0 Lymphedema, not elsewhere classified 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 other part of right lower leg with other specified severity Facility Procedures : CPT4: Code [id number] foot Description: 1 BILATERAL: Application of multi-layer venous compression system; leg (below knee), including ankle and . Modifier: Quantity: 1 Physician Procedures : CPT4 Code Description Modifier [id number] - WC PHYS LEVEL 4 - EST PT ICD-10 Diagnosis Description I87.333 Chronic venous hypertension (idiopathic) with ulcer and inflammation of bilateral lower extremity I89.0 Lymphedema, not elsewhere classified  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 other part of right lower leg with other specified severity Quantity: 1 Electronic Signature(s) Signed: 07/09/2022 2:26:21 PM By: Worthy Keeler PA-C Entered By: Worthy Keeler on 07/09/2022 14:26:21

## 2022-07-10 NOTE — Progress Notes (Signed)
[name], [name] ([id number]) [phone].pdf Page 1 of 8 Visit Report for 07/09/2022 Arrival Information Details Patient Name: Date of Service: [name], [name] 07/09/2022 12:45 PM Medical Record Number: [id number] Patient Account Number: [id number] Date of Birth/Sex: Treating RN: 26-May-1945 (77 y.o. [name], [name] Primary Care [name] Varricchio: [name] Other Clinician: Referring Verona Hartshorn: Treating Maylin Freeburg/Extender: [name] in Treatment: 15 Visit Information History Since Last Visit Added or deleted any medications: No Patient Arrived: [name] Any new allergies or adverse reactions: No Arrival Time: 12:45 Had a fall or experienced change in No Accompanied By: wife activities of daily living that may affect Transfer Assistance: None risk of falls: Patient Identification Verified: Yes Signs or symptoms of abuse/neglect since last visito No Secondary Verification Process Completed: Yes Hospitalized since last visit: No Patient Requires Transmission-Based Precautions: No Implantable device outside of the clinic excluding No Patient Has Alerts: Yes cellular tissue based products placed in the center Patient Alerts: Patient on Blood Thinner since last visit: Right ABI in clinic [place] Has Dressing in Place as Prescribed: No Has Compression in Place as Prescribed: No Pain Present Now: No Notes per patient removed wrap yesterday due to pain and dumbness. Per patient did not follow up with PCP about fluid overload or Prism about compression stockings. Electronic Signature(s) Signed: 07/09/2022 5:20:51 PM By: [name], [name] Entered By: Deon Pilling on 07/09/2022 12:53:15 -------------------------------------------------------------------------------- Compression Therapy Details Patient Name: Date of Service: [name], [name] 07/09/2022 12:45 PM Medical Record Number: [id number] Patient Account Number: [id number] Date of Birth/Sex:  Treating RN: 01/06/1946 (77 y.o. [name] Primary Care Brogen Duell: [name] Other Clinician: Referring [name]: Treating [name]/Extender: [name] Weeks in Treatment: 15 Compression Therapy Performed for Wound Assessment: Wound #6 Right,Medial Lower Leg Performed By: Clinician Deon Pilling, RN Compression Type: Four Layer Post Procedure Diagnosis Same as Pre-procedure Electronic Signature(s) Signed: 07/09/2022 5:20:51 PM By: [name], [name] Entered By: Deon Pilling on 07/09/2022 13:58:22 Downum, [name] ([id number].pdf Page 2 of 8 -------------------------------------------------------------------------------- Compression Therapy Details Patient Name: Date of Service: [name], [name] 07/09/2022 12:45 PM Medical Record Number: [id number] Patient Account Number: [id number] Date of Birth/Sex: Treating RN: 11/26/1945 (77 y.o. [name] Primary Care [name]: [name] Other Clinician: Referring [name]: Treating [name]/Extender: [name] Weeks in Treatment: 15 Compression Therapy Performed for Wound Assessment: Wound #7 Left,Circumferential Lower Leg Performed By: Clinician Deon Pilling, RN Compression Type: Four Layer Post Procedure Diagnosis Same as Pre-procedure Electronic Signature(s) Signed: 07/09/2022 5:20:51 PM By: [name], [name] Entered By: Deon Pilling on 07/09/2022 13:58:22 -------------------------------------------------------------------------------- Encounter Discharge Information Details Patient Name: Date of Service: [name], [name] 07/09/2022 12:45 PM Medical Record Number: [id number] Patient Account Number: [id number] Date of Birth/Sex: Treating RN: 11-Nov-1945 (76 y.o. [name] Primary Care [name]: [name] Other Clinician: Referring [name]: Treating [name]/Extender: [name] in  Treatment: 15 Encounter Discharge Information Items Discharge Condition: Stable Ambulatory Status: Cane Discharge Destination: Home Transportation: Private Auto Accompanied By: spouse Schedule Follow-up Appointment: Yes Clinical Summary of Care: Electronic Signature(s) Signed: 07/09/2022 5:20:51 PM By: [name], [name] Entered By: Deon Pilling on 07/09/2022 13:59:20 -------------------------------------------------------------------------------- Lower Extremity Assessment Details Patient Name: Date of Service: [name], [name] 07/09/2022 12:45 PM Medical Record Number: [id number] Patient Account Number: [id number] Date of Birth/Sex: Treating RN: [birth year] (77 y.o. [name] Primary Care [name]: [name] Other Clinician: Referring [name]:  Treating [name]/Extender: [name], [name] Weeks in Treatment: 15 Edema Assessment Left: [Left: Right] [Right: :] Assessed: [Left: Yes] [Right: Yes] Edema: [Left: Yes] [Right: Yes] Calf Left: Right: Point of Measurement: 40 cm From Medial Instep 64 cm 63.5 cm Ankle Left: Right: Point of Measurement: 13 cm From Medial Instep 44 cm 42 cm Vascular Assessment Pulses: Dorsalis Pedis Palpable: [Left:Yes] Electronic Signature(s) Signed: 07/09/2022 5:20:51 PM By: [name], [name] Entered By: Deon Pilling on 07/09/2022 13:07:37 -------------------------------------------------------------------------------- Multi-Disciplinary Care Plan Details Patient Name: Date of Service: [name], [name] 07/09/2022 12:45 PM Medical Record Number: [id number] Patient Account Number: [id number] Date of Birth/Sex: Treating RN: 1945/07/10 (77 y.o. [name] Primary Care Mikaele Stecher: [name] Other Clinician: Referring [name]: Treating [name]/Extender: [name] Weeks in Treatment: 15 Active Inactive Wound/Skin Impairment Nursing Diagnoses: Impaired tissue integrity Knowledge  deficit related to ulceration/compromised skin integrity Goals: Patient will have a decrease in wound volume by X% from date: (specify in notes) Date Initiated: 03/26/2022 Target Resolution Date: 07/18/2022 Goal Status: Active Patient/caregiver will verbalize understanding of skin care regimen Date Initiated: 03/26/2022 Target Resolution Date: 07/18/2022 Goal Status: Active Ulcer/skin breakdown will have a volume reduction of 30% by week 4 Date Initiated: 03/26/2022 Date Inactivated: 05/21/2022 Target Resolution Date: 05/17/2022 Unmet Reason: see wound Goal Status: Unmet measurement. Ulcer/skin breakdown will have a volume reduction of 50% by week 8 Date Initiated: 03/26/2022 Date Inactivated: 05/21/2022 Target Resolution Date: 05/17/2022 Unmet Reason: see wound Goal Status: Unmet measurement. Interventions: Assess patient/caregiver ability to obtain necessary supplies Assess patient/caregiver ability to perform ulcer/skin care regimen upon admission and as needed Assess ulceration(s) every visit Notes: Patient stated today, "I will take my fluid pill or pump not do both." Genieve Ramaswamy made aware. Electronic Signature(s) Wetsel, [name] ([id number]) [id number] Page 4 of 8 Signed: 07/09/2022 5:20:51 PM By: [name], [name] Entered By: Deon Pilling on 07/09/2022 13:11:08 -------------------------------------------------------------------------------- Pain Assessment Details Patient Name: Date of Service: [name], [name] 07/09/2022 12:45 PM Medical Record Number: [id number] Patient Account Number: [id number] Date of Birth/Sex: Treating RN: [birth year] (77 y.o. [name] Primary Care Ermalinda Joubert: [name] Other Clinician: Referring Aspen Lawrance: Treating Sajjad Honea/Extender: [name] Weeks in Treatment: 15 Active Problems Location of Pain Severity and Description of Pain Patient Has Paino No Site Locations Pain Management and  Medication Current Pain Management: Notes per patient no pain at this time. Electronic Signature(s) Signed: 07/09/2022 5:20:51 PM By: [name], [name] Entered By: Deon Pilling on 07/09/2022 12:54:55 -------------------------------------------------------------------------------- Patient/Caregiver Education Details Patient Name: Date of Service: [place], [name] 2/21/2024andnbsp12:45 PM Medical Record Number: [id number] Patient Account Number: [id number] Date of Birth/Gender: Treating RN: [birth year] (77 y.o. [name] Primary Care Physician: [name] Other Clinician: Referring Physician: Treating Physician/Extender: [name] Weeks in Treatment: 15 Education Assessment [place], [name] ([id number]) [id number] Page 5 of 8 Education Provided To: Patient Education Topics Provided Venous: Handouts: Controlling Swelling with Compression Stockings, Controlling Swelling with Multilayered Compression Wraps, Managing Venous Insufficiency Methods: Explain/Verbal Responses: Reinforcements needed Wound/Skin Impairment: Handouts: Caring for Your Ulcer Methods: Explain/Verbal Responses: Reinforcements needed Electronic Signature(s) Signed: 07/09/2022 5:20:51 PM By: [name], [name] Entered By: Deon Pilling on 07/09/2022 13:11:43 -------------------------------------------------------------------------------- Wound Assessment Details Patient Name: Date of Service: Coval, [name] 07/09/2022 12:45 PM Medical Record Number: [id number] Patient Account Number: [id number] Date of Birth/Sex: Treating RN: 1946-04-12 (77 y.o. [name] Primary Care Nathalia Wismer: [name]  Other Clinician: Referring Dequann Vandervelden: Treating Cedric Mcclaine/Extender: [name], [name] Weeks in Treatment: 15 Wound Status Wound Number: 6 Primary Lymphedema Etiology: Wound Location: Right, Medial Lower Leg Wound Open Wounding Event:  Gradually Appeared Status: Date Acquired: 03/05/2022 Comorbid Anemia, Lymphedema, Deep Vein Thrombosis, Hypertension, Weeks Of Treatment: 15 History: Received Radiation Clustered Wound: Yes Photos Wound Measurements Length: (cm) 1 Width: (cm) 4 Depth: (cm) 0 Clustered Quantity: 2 Area: (cm) Volume: (cm) 7 % Reduction in Area: -357.7% 9 % Reduction in Volume: -357.7% .1 Epithelialization: Small (1-33%) Tunneling: No 654.237 Undermining: No 65.424 Wound Description Classification: Full Thickness Without Exposed Support Structures Wound Margin: Distinct, outline attached Exudate Amount: Large Exudate Type: Serosanguineous Exudate Color: red, brown Novakovich, Gianmarco ([id number]) Wound Bed Granulation Amount: Small (1-33%) Granulation Quality: Pink Necrotic Amount: Large (67-100%) Necrotic Quality: Adherent Slough Foul Odor After Cleansing: No Slough/Fibrino Yes [phone].pdf Page 6 of 8 Exposed Structure Fascia Exposed: No Fat Layer (Subcutaneous Tissue) Exposed: Yes Tendon Exposed: No Muscle Exposed: No Joint Exposed: No Bone Exposed: No Periwound Skin Texture Texture Color No Abnormalities Noted: No No Abnormalities Noted: No Callus: No Atrophie Blanche: No Crepitus: No Cyanosis: No Excoriation: No Ecchymosis: No Induration: No Erythema: No Rash: No Hemosiderin Staining: No Scarring: No Mottled: No Pallor: No Moisture Rubor: No No Abnormalities Noted: No Dry / Scaly: No Maceration: Yes Treatment Notes Wound #6 (Lower Leg) Wound Laterality: Right, Medial Cleanser Soap and Water Discharge Instruction: May shower and wash wound with dial antibacterial soap and water prior to dressing change. Peri-Wound Care Sween Lotion (Moisturizing lotion) Discharge Instruction: Apply moisturizing lotion as directed Topical Primary Dressing Cutimed Sorbact Swab Discharge Instruction: Apply to wound bed as instructed Sorbalgon Dressing  4x4 (in/in) Discharge Instruction: Apply to wound bed as instructed Compounding topical antibiotics- [facility] Discharge Instruction: apply under alignate Ag when patient brings with appts. Secondary Dressing ABD Pad, 5x9 Discharge Instruction: Apply over primary dressing as directed. Woven Gauze Sponge, Non-Sterile 4x4 in Discharge Instruction: Apply over primary dressing as directed. Secured With Compression Wrap Urgo K2, two layer compression system, regular Discharge Instruction: Apply Urgo K2 as directed (alternative to 4 layer compression). Compression Stockings Add-Ons Electronic Signature(s) Signed: 07/09/2022 5:20:51 PM By: [name], [name] Entered By: Deon Pilling on 07/09/2022 13:09:11 Popiel, [name] ([id number] Page 7 of 8 -------------------------------------------------------------------------------- Wound Assessment Details Patient Name: Date of Service: Richter, [name] 07/09/2022 12:45 PM Medical Record Number: [id number] Patient Account Number: [id number] Date of Birth/Sex: Treating RN: [birth year] (77 y.o. [name], [name] Primary Care Malan Werk: [name] Other Clinician: Referring Amaurie Schreckengost: Treating Kaeleigh Westendorf/Extender: [name] Weeks in Treatment: 15 Wound Status Wound Number: 7 Primary Lymphedema Etiology: Wound Location: Left, Circumferential Lower Leg Wound Open Wounding Event: Gradually Appeared Status: Date Acquired: 03/05/2022 Comorbid Anemia, Lymphedema, Deep Vein Thrombosis, Hypertension, Weeks Of Treatment: 15 History: Received Radiation Clustered Wound: Yes Photos Wound Measurements Length: (cm) Width: (cm) Depth: (cm) Clustered Quantity: Area: (cm) Volume: (cm) 7 % Reduction in Area: -448.3% 47 % Reduction in Volume: -448.4% 0.1 Epithelialization: Small (1-33%) 5 Tunneling: No 258.396 Undermining: No 25.84 Wound Description Classification: Full  Thickness Without Exposed Sup Wound Margin: Distinct, outline attached Exudate Amount: Medium Exudate Type: Serosanguineous Exudate Color: red, brown port Structures Foul Odor After Cleansing: No Slough/Fibrino Yes Wound Bed Granulation Amount: Large (67-100%) Exposed Structure Granulation Quality: Pink, Pale Fascia Exposed: No Necrotic Amount: Small (1-33%) Fat Layer (Subcutaneous Tissue) Exposed: Yes Necrotic Quality: Adherent Slough Tendon Exposed: No Muscle Exposed:  No Joint Exposed: No Bone Exposed: No Periwound Skin Texture Texture Color No Abnormalities Noted: No No Abnormalities Noted: No Callus: No Atrophie Blanche: No Crepitus: No Cyanosis: No Excoriation: No Ecchymosis: No Induration: No Erythema: No Rash: No Hemosiderin Staining: Yes Scarring: No Mottled: No Pallor: No Moisture Rubor: No No Abnormalities Noted: No Dry / Scaly: No Maceration: No Treatment Notes Wound #7 (Lower Leg) Wound Laterality: Left, Circumferential Hefter, Dearius ([id number] Page 8 of 8 Cleanser Soap and Water Discharge Instruction: May shower and wash wound with dial antibacterial soap and water prior to dressing change. Peri-Wound Care Sween Lotion (Moisturizing lotion) Discharge Instruction: Apply moisturizing lotion as directed Topical Primary Dressing Cutimed Sorbact Swab Discharge Instruction: Apply to wound bed as instructed Sorbalgon Dressing 4x4 (in/in) Discharge Instruction: Apply to wound bed as instructed Compounding topical antibiotics- [facility] Discharge Instruction: apply under alignate Ag when patient brings with appts. Secondary Dressing ABD Pad, 5x9 Discharge Instruction: Apply over primary dressing as directed. Woven Gauze Sponge, Non-Sterile 4x4 in Discharge Instruction: Apply over primary dressing as directed. Secured With Compression Wrap Urgo K2, two layer compression system, regular Discharge  Instruction: Apply Urgo K2 as directed (alternative to 4 layer compression). Compression Stockings Add-Ons Electronic Signature(s) Signed: 07/09/2022 5:20:51 PM By: [name], [name] Entered By: Deon Pilling on 07/09/2022 13:09:38 -------------------------------------------------------------------------------- Vitals Details Patient Name: Date of Service: [place], [place] 07/09/2022 12:45 PM Medical Record Number: [id number] Patient Account Number: [id number] Date of Birth/Sex: Treating RN: 1945-12-29 (77 y.o. [name] Primary Care Xara Paulding: [name] Other Clinician: Referring Viktoria Gruetzmacher: Treating Jakwon Gayton/Extender: [name] Weeks in Treatment: 15 Vital Signs Time Taken: 12:50 Temperature (F): 97.8 Height (in): 74 Pulse (bpm): 92 Weight (lbs): 250 Respiratory Rate (breaths/min): 20 Body Mass Index (BMI): 32.1 Blood Pressure (mmHg): 163/79 Reference Range: 80 - 120 mg / dl Electronic Signature(s) Signed: 07/09/2022 5:20:51 PM By: [name], [name] Entered By: Deon Pilling on 07/09/2022 12:54:44

## 2022-07-16 ENCOUNTER — Encounter (HOSPITAL_BASED_OUTPATIENT_CLINIC_OR_DEPARTMENT_OTHER): Payer: Medicare Other | Admitting: Physician Assistant

## 2022-07-16 DIAGNOSIS — I87333 Chronic venous hypertension (idiopathic) with ulcer and inflammation of bilateral lower extremity: Secondary | ICD-10-CM | POA: Diagnosis not present

## 2022-07-17 NOTE — Progress Notes (Addendum)
[name], [name] ([id number]) [id number] Page 1 of 9 Visit Report for 07/16/2022 Chief Complaint Document Details Patient Name: Date of Service: [name] 07/16/2022 2:15 PM Medical Record Number: [id number] Patient Account Number: [id number] Date of Birth/Sex: Treating RN: [birth year] (77 y.o. M) Primary Care Provider: [name] Other Clinician: Referring Provider: Treating Provider/Extender: [name] Weeks in Treatment: 16 Information Obtained from: Patient Chief Complaint 02/23/2020; patient is here for wounds on his bilateral lower legs in the setting of severe lymphedema 03/26/2022; patient is here for wounds on his bilateral lower legs medial aspect Electronic Signature(s) Signed: 07/16/2022 2:57:02 PM By: Worthy Keeler PA-C Entered By: Worthy Keeler on 07/16/2022 14:57:02 -------------------------------------------------------------------------------- HPI Details Patient Name: Date of Service: [name] 07/16/2022 2:15 PM Medical Record Number: [id number] Patient Account Number: [id number] Date of Birth/Sex: Treating RN: [birth year] (77 y.o. M) Primary Care Provider: [name] Other Clinician: Referring Provider: Treating Provider/Extender: [name] Weeks in Treatment: 16 History of Present Illness HPI Description: ADMISSION 02/23/2020 Patient is a 77 year old man who lives in [place] who arrives accompanied by his wife. He has a history of chronic lymphedema and venous insufficiency in his bilateral lower legs which may have something to do that with having a history of DVT as well as being treated for prostate cancer. In any case he recently got compression pumps at home but compliance has been an issue here. He has compression stockings however they are probably not sufficient enough to control swelling. They tell us that things deteriorated for him in late August he was  admitted to [facility] for 7 days. This was with cellulitis I think of his bilateral lower legs. Discharge he was noted to have wounds on his bilateral lower legs. He was discharged on Bactrim. They tried to get him home health through [facility] of course they declined him. His wife is been wrapping these applying some form of silver foam dressing. He has a history of wounds before although nothing that would not heal with basic home topical dressings. He has 2 areas on the left medial, left anterior and left lateral and a smaller area on the right medial. All of these have considerable depth. Past medical history includes iron deficiency anemia, lymphedema followed by the rehab center at [facility] with lymphedema wraps I believe, DVT on chronic anticoagulation, prostate cancer, chronic venous insufficiency, hypertension. As mentioned he has compression pumps but does not use them. ABIs in our clinic were noncompressible bilaterally 10/14; patient with severe bilateral lymphedema right greater than left. He came in with bilateral lower extremity wounds left greater than right. Even though the right side has more of the edema most of the wounds here almost closed on the right medial. He has 3 remaining wounds on the left We have been using silver alginate under 4-layer compression I have been trying to get him to be compliant with his external compression pumps 10/21; patient with 3 small wounds on the left leg and 1 on the right medial in the setting of severe lymphedema and chronic venous insufficiency. We have been using silver alginate under 4-layer compression he is using his external compression pumps twice a day 11/4; ARTERIAL STUDIES on the right show an ABI of 1.02 TBI of 0.858 with biphasic waveforms on the left 0.98 with a TBI of 0.55 and biphasic waveforms. Does not look like he has significant arterial disease. We are treating  him for lymphedema he has compression  pumps. He has punched-out areas on the left anterior left lateral and right medial lower extremities 11/11; after we obtained his arterial studies I put him in 4 layer compression. He is using his compression pumps probably once a day although I have asked him to do twice. Primary dressing to the wound is silver collagen he has severe lymphedema likely secondary to chronic venous insufficiency. Wounds on the left lateral, left medial and left anterior and a small area on the right medial 12/2; the area on the right anterior lower leg has healed. We initially thought that the area medially had healed as well however when her discharge nurse came in she detected fluid in the wound simply opened up. This is actually worse than I remember this pain. The area on the left lateral potentially slightly smaller He is also complaining about pain in his left hand he says that this is actually been getting some better he has been using topical creams on this. She asked that I look at this 12/9 after last weeks issues we have 2 wounds one on the right medial lower leg and 1 on the left lateral. Both of these are in the same condition. I think because of thickened skin secondary to chronic lymphedema these wounds actually have depth of almost 0.8 cm. 12/16; the patient has 2 small but deep wounds one on the right medial and one on the left lateral. The right medial is actually the worst of these. He arrives in clinic today with absolutely terrible edema in the right leg apparently his 4-layer wrap fell down to just above his ankle he did not think about this he is [name], [name] ([id number]) [id number] Page 2 of 9 apparently been continuing to use his compression pump twice a day. The left leg looks a lot better. 05/09/2020 upon evaluation today patient appears to be doing decently well in regard to his wounds. Everything is measuring smaller the right leg still has a little bit deeper  wound in the left seems to be almost completely healed in my opinion I am very pleased in general with how things are progressing. He has a 4- layer compression wrap we have been using endoform today we will probably have to use collagen just based on the fact that we do not have endoform it is on order. 1/6; the patient's wound on the left lateral lower leg has healed. Still has 1 on the right medial. 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 Using compression pumps at home twice a day. 1/13; left lateral lower leg is still healed. He has a deep punched out rectangular shaped wound on the right medial calf. Looking down at this it appears that he is attempting to epithelialize around the edges of the wound and on the base as well. His edema is reasonably well controlled we have been using collagen with absolutely no effect 1/20; left lateral lower leg remains closed he has extremitease stockings. The area on the right medial calf I aggressively debrided last week measures larger but the surface looks better. We have been using Hydrofera Blue. We ran Oasis through his insurance but we have not seen the results of this 1/27; left lower leg wound with chronic venous insufficiency and secondary lymphedema. I did aggressive debridement on this last week the wound seems to have come in healthy looking surface using Hydrofera Blue. He was denied for Oasis 2/3; small divot in the right medial  lower leg. Under illumination the walls of this divot are epithelialized however the base has slough which I removed with a curette we have been using Hydrofera Blue 2/10 small divot on the right medial lower leg pinpoint illumination at the base of this cone-shaped wound. We have been using Hydrofera Blue but I will switch to calcium alginate this week 2/17; the small divot on the right medial lower leg is fully epithelialized. There is no visible open area under illumination. He has his own  stocking for the right leg similar to the one he has been wearing on the left. 03/26/2022; READMISSION This is a now 77 year old man that we had in the clinic from 02/23/2020 through 07/05/2020. At that point he had bilateral lower extremity wounds left greater than right in the setting of severe lymphedema. He had already obtained compression pumps ordered for him I think from the wound care clinic in [facility] so I do not really have record of what he has been using. He claims to be using them once a day but there is a problem with the sleeve on the left leg. About 2 weeks ago he was hospitalized from 03/11/2022 through [id number] with diastolic congestive heart failure. 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 He was diuresed. Developed some prerenal azotemia and he has not been taking any diuretics currently. He has not been putting stockings on his legs since he got out of hospital and still has his legs dependent for long periods. Past medical history history of prostate cancer treated with prostatectomy and radiation this was apparently about 8 years ago, history of DVT on chronic Coumadin, history of lymphedema was managed for a while at the clinic in [place]. History of inguinal hernia repair in September 22, hypertension, stage IIIb chronic renal failure ABIs today were noncompressible on the right 1.12 on the left 04-02-2022 upon evaluation today patient appears to be doing well currently in regard to his legs I do feel like both areas that are draining are actually much drier than they were in the picture last week although the left is drier than the right. He is tolerating the 4-layer compression wraps at this point he did contact the pump company and they are actually working on getting him a new compression sleeve for one of his legs which have previously popped and was not functioning properly. 04-23-2022 upon evaluation today  patient appears to be doing well currently in regard to his wounds on the legs. I am actually very pleased with where things stand and I do feel like that we are headed in the right direction. Fortunately there is no sign of active infection locally or systemically at this time. 05-07-2022 upon evaluation today patient appears to be doing well currently in regard to his wounds in fact things are showing signs of improvement which is good news I do not see too much that actually appears to be open and I am very pleased in that regard. No fevers, chills, nausea, vomiting, or diarrhea. 05-21-2022 upon evaluation today patient appears to be doing somewhat poorly in regard to drainage of his lower extremities bilaterally. The right is greater than left as far as the weeping area. Nonetheless it seems to be getting worse not better. He actually has pitting edema which is at least 2+ to the thighs and I am concerned about the fact that he is may be fluid overloaded in general and that is the  reason why we cannot get this under control. I know he is not using his pumps all the time because he actually told the nurse that he was either going to pump or he was going to use his fluid pills but not do both. For that reason I do think that he needs to be really doing both in order to get the fluid out as effectively as possible obviously with the 4-layer compression wraps were doing as much as we can from a compression standpoint but it is really not enough. He tells me that he elevates his leg is much as he can in between pumping and other activity throughout the day. 05-28-2022 upon evaluation today patient appears to be doing better in regard to his wounds although the measurements may be a little bit larger this is a very difficult wound to heal it is very indistinct in a lot of areas. Nonetheless there is can be some need for sharp debridement in regard to both medial and lateral legs. Fortunately I see no signs  of active infection locally nor systemically at this time. No fevers, chills, nausea, vomiting, or diarrhea. 06-04-2022 upon evaluation today patient appears to be doing poorly in general in regard to the wounds on his legs. He still continues to have a tremendous amount of fluid not just in the lower portion of his leg but to be honest his thigh where he has 2-3+ pitting edema in the thigh as well. Unfortunately I do not know that we will be able to get this healed effectively and keep it healed on the lower extremities unless he gets the overall fluid situation taking and under control. Fortunately I do not see any signs of infection locally nor systemically which is great news. He just seems to be very fluid overloaded. 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 He has been tolerating the dressing changes with the compression wraps without complication. Fortunately I do not see any evidence of infection at this time which is great news. No fevers, chills, nausea, vomiting, or diarrhea. 06-18-2022 upon evaluation today patient appears to be doing well currently in regard to his wounds as far as not looking like they are terribly infected but nonetheless I am concerned about a subacute infection secondary to the fact that he continues to have spreading despite the compression therapy. We actually did do an Unna boot on him last week this is actually the first wrap that actually stayed up everything else has been sliding down quite significantly. Fortunately there does not appear to be any signs of infection systemically at this time. With that being said I do believe that locally there seems to be an issue going on here and again I [name] Peer do a PCR culture to see what that shows also think that I am going to put him on a broad-spectrum antibiotic, doxycycline to see how that will help as well.  He does tell me that coming into the clinic today that he was feeling short of breath like "he was about to have a heart attack" because he was having such a hard time breathing. He says that he told this to Dr. Debbora Dus his cardiologist as well when he was evaluated in the past 1 to 2 weeks. 07-02-2022 upon evaluation today patient appears to be doing poorly currently in regard to his wound. He has been tolerating the dressing changes. Unfortunately he has not had any  compression wraps on for the past week because he was unable to make it in for his appointment last week. With that being said he has a significant amount of drainage he tells me has been using his pumps but despite this in the pumps he still has been draining quite a bit. The drainage is also somewhat purulent unfortunately. We did attempt to get in touch with his cardiologist last week unfortunately we were unable to get up with him I did advise that the patient needs to get in touch with him upon leaving today in order to make sure they know he is on the new antibiotics I am going to send him this will be Levaquin and Augmentin. 07-09-2022 upon evaluation today patient appears to be doing about the same in regard to his legs he may have just a slight amount of improvement with regard to the drainage probably [place] topical antibiotics are helping in this regard to some degree. Fortunately there does not appear to be any signs of active Borthwick, Arth ([id number]) [id number] Page 3 of 9 infection systemically which is great news. No fevers, chills, nausea, vomiting, or diarrhea. 07-16-2022 upon evaluation today patient appears to be doing well currently in regard to his wound. He has been tolerating the dressing changes without complication. Fortunately there does not appear to be any signs of active infection locally nor systemically at this time. With that being said he cannot keep the wraps up he tells me  on the left side he had to cut this down because it got too tight. He has been using his pumps but he is on the right side the wrap actually straight down causing some pushing around the central part of his leg just below the calf I think this is a bigger risk for him that help at this point. I think that we may need to try something different he should be getting his compression socks shortly he tells me they were ordered last Thursday. Electronic Signature(s) Signed: 07/16/2022 3:25:36 PM By: Worthy Keeler PA-C Entered By: Worthy Keeler on 07/16/2022 15:25:36 -------------------------------------------------------------------------------- Physical Exam Details Patient Name: Date of Service: [name], [name] 07/16/2022 2:15 PM Medical Record Number: [id number] Patient Account Number: [id number] Date of Birth/Sex: Treating RN: 1946-04-05 (77 y.o. M) Primary Care Provider: [name] Other Clinician: Referring Provider: Treating Provider/Extender: [name] Weeks in Treatment: 45 Constitutional Well-nourished and well-hydrated in no acute distress. Respiratory normal breathing without difficulty. Psychiatric this patient is able to make decisions and demonstrates good insight into disease process. Alert and Oriented x 3. pleasant and cooperative. Notes Upon inspection patient's wound bed actually showed signs of again still having weeping on his legs there is no specific open wounds but again we need to try to keep the wrap from sliding down and keep him from removing them. I think that this is one of the biggest concerns at this point in regard to his treatment course and plan. With that being said he is in agreement with trying the Ace wrap's over the next week and see how things go. Electronic Signature(s) Signed: 07/16/2022 3:26:18 PM By: Worthy Keeler PA-C Entered By: Worthy Keeler on 07/16/2022  15:26:18 -------------------------------------------------------------------------------- Physician Orders Details Patient Name: Date of Service: [name], [name] 07/16/2022 2:15 PM Medical Record Number: [id number] Patient Account Number: [id number] Date of Birth/Sex: Treating RN: 12-15-1945 (77 y.o. [name] Primary Care Provider: [name] Other Clinician: Referring Provider: Treating  Provider/Extender: [name] Weeks in Treatment: 16 Verbal / Phone Orders: No Diagnosis Coding ICD-10 Coding Code Description [phone] Chronic venous hypertension (idiopathic) with ulcer and inflammation of bilateral lower extremity I89.0 Lymphedema, not elsewhere classified 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 other part of right lower leg with other specified severity Follow-up Appointments ppointment in 1 week. - Dr. Deborah Chalk Wednesday 07/23/2022 130pm Return A Other: - Will call you cardiologist/Primary Care Provider to make aware of your shortness of breath with exertion, fluid overload in both legs, and elevated blood pressure. ****Please bring the topical antibiotics at each visit.**** See your primary care provider about oral antibiotics. Call [employer] company about your compression garments-where they are in the mail. ***Change daily. [employer] will send you medical supplies in the mail.**** Anesthetic Hausner, [name] ([id number]) [id number] Page 4 of 9 (In clinic) Topical Lidocaine 5% applied to wound bed Bathing/ Shower/ Hygiene May shower with protection but do not get wound dressing(s) wet. Protect dressing(s) with water repellant cover (for example, large plastic bag) or a cast cover and may then take shower. Edema Control - Lymphedema / SCD / Other Lymphedema Pumps. Use Lymphedema pumps on leg(s) 2-3 times a day for 45-60  minutes. If wearing any wraps or hose, do not remove them. Continue exercising as instructed. Elevate legs to the level of the heart or above for 30 minutes daily and/or when sitting for 3-4 times a day throughout the day. Avoid standing for long periods of time. Exercise regularly Compression stocking or Garment 20-30 mm/Hg pressure to: - once they arrive remove ace wraps; apply in the morning and remove at night. Wound Treatment Wound #6 - Lower Leg Wound Laterality: Right, Medial Cleanser: Soap and Water 1 x Per Day/30 Days Discharge Instructions: May shower and wash wound with dial antibacterial soap and water prior to dressing change. Peri-Wound Care: Sween Lotion (Moisturizing lotion) 1 x Per Day/30 Days Discharge Instructions: Apply moisturizing lotion as directed Prim Dressing: Maxorb Extra Ag+ Alginate Dressing, 4x4.75 (in/in) 1 x Per Day/30 Days ary Discharge Instructions: Apply to wound bed as instructed Prim Dressing: Compounding topical antibiotics- [place] 1 x Per Day/30 Days ary Discharge Instructions: apply under alignate Ag when patient brings with appts. Secondary Dressing: ABD Pad, 5x9 1 x Per Day/30 Days Discharge Instructions: Apply over primary dressing as directed. Secondary Dressing: Woven Gauze Sponge, Non-Sterile 4x4 in 1 x Per Day/30 Days Discharge Instructions: Apply over primary dressing as directed. Secured With: [employer], 4.5x3.1 (in/yd) 1 x Per Day/30 Days Discharge Instructions: Secure with Kerlix as directed. Secured With: 39M Medipore H Soft Cloth Surgical T ape, 4 x 10 (in/yd) 1 x Per Day/30 Days Discharge Instructions: Secure with tape as directed. Secured With: ace wrap 1 x Per Day/30 Days Discharge Instructions: apply to legs until compression garments arrive Wound #7 - Lower Leg Wound Laterality: Left, Circumferential Cleanser: Soap and Water 1 x Per Day/30 Days Discharge Instructions: May shower and wash wound with dial  antibacterial soap and water prior to dressing change. Peri-Wound Care: Sween Lotion (Moisturizing lotion) 1 x Per Day/30 Days Discharge Instructions: Apply moisturizing lotion as directed Prim Dressing: Maxorb Extra Ag+ Alginate Dressing, 4x4.75 (in/in) 1 x Per Day/30 Days ary Discharge Instructions: Apply to wound bed as instructed Prim Dressing: Compounding topical antibiotics- [place] 1 x Per Day/30 Days ary Discharge Instructions: apply under alignate Ag when patient  brings with appts. Secondary Dressing: ABD Pad, 5x9 1 x Per Day/30 Days Discharge Instructions: Apply over primary dressing as directed. Secondary Dressing: Woven Gauze Sponge, Non-Sterile 4x4 in 1 x Per Day/30 Days Discharge Instructions: Apply over primary dressing as directed. Secured With: [employer], 4.5x3.1 (in/yd) 1 x Per Day/30 Days Discharge Instructions: Secure with Kerlix as directed. Secured With: 60M Medipore H Soft Cloth Surgical T ape, 4 x 10 (in/yd) 1 x Per Day/30 Days Discharge Instructions: Secure with tape as directed. Compression Wrap: Ace wrap 1 x Per Day/30 Days Discharge Instructions: apply to legs until compression garments arrive. Electronic Signature(s) Signed: 07/16/2022 3:56:54 PM By: Worthy Keeler PA-C Signed: 07/16/2022 6:07:49 PM By: [name], [name] Entered By: Deon Pilling on 07/16/2022 15:09:14 Simi, [name] ([id number]) [id number] Page 5 of 9 -------------------------------------------------------------------------------- Problem List Details Patient Name: Date of Service: [name] 07/16/2022 2:15 PM Medical Record Number: [id number] Patient Account Number: [id number] Date of Birth/Sex: Treating RN: Sep 21, 1945 (77 y.o. M) Primary Care Provider: [name] Other Clinician: Referring Provider: Treating Provider/Extender: [name] in Treatment: 16 Active Problems ICD-10 Encounter Code  Description Active Date MDM Diagnosis I87.333 Chronic venous hypertension (idiopathic) with ulcer and inflammation of 06/11/2022 No Yes bilateral lower extremity I89.0 Lymphedema, not elsewhere classified 03/26/2022 No Yes L97.828 Non-pressure chronic ulcer of other part of left lower leg with other specified 03/26/2022 No Yes severity 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 other part of right lower leg with other specified 03/26/2022 No Yes severity Inactive Problems Resolved Problems Electronic Signature(s) Signed: 07/16/2022 2:56:52 PM By: Worthy Keeler PA-C Entered By: Worthy Keeler on 07/16/2022 14:56:52 -------------------------------------------------------------------------------- Progress Note Details Patient Name: Date of Service: [name], [name] 07/16/2022 2:15 PM Medical Record Number: [id number] Patient Account Number: [id number] Date of Birth/Sex: Treating RN: 16-Sep-1945 (77 y.o. M) Primary Care Provider: [name] Other Clinician: Referring Provider: Treating Provider/Extender: [name] Weeks in Treatment: 16 Subjective Chief Complaint Information obtained from Patient 02/23/2020; patient is here for wounds on his bilateral lower legs in the setting of severe lymphedema 03/26/2022; patient is here for wounds on his bilateral lower legs medial aspect History of Present Illness (HPI) ADMISSION 02/23/2020 Patient is a 77 year old man who lives in [place] who arrives accompanied by his wife. He has a history of chronic lymphedema and venous insufficiency in his bilateral lower legs which may have something to do that with having a history of DVT as well as being treated for prostate cancer. In any case he recently got compression pumps at home but compliance has been an issue here. He has compression stockings however they are probably not sufficient enough to control swelling. They tell us that things deteriorated for him in late August he  was admitted to [facility] for 7 days. This was with cellulitis I think of his bilateral lower legs. Discharge he was noted to have wounds on his bilateral lower legs. He was discharged on Bactrim. They tried to get him home health through [facility] of course they declined him. His wife is been wrapping these applying some form of silver foam dressing. He has a history of wounds [name], [name] ([id number]) [id number] Page 6 of 9 before although nothing that would not heal with basic home topical dressings. He has 2 areas on the left medial, left anterior and left lateral and a smaller area on the right medial. All of these have  considerable depth. Past medical history includes iron deficiency anemia, lymphedema followed by the rehab center at [facility] with lymphedema wraps I believe, DVT on chronic anticoagulation, prostate cancer, chronic venous insufficiency, hypertension. As mentioned he has compression pumps but does not use them. ABIs in our clinic were noncompressible bilaterally 10/14; patient with severe bilateral lymphedema right greater than left. He came in with bilateral lower extremity wounds left greater than right. Even though the right side has more of the edema most of the wounds here almost closed on the right medial. He has 3 remaining wounds on the left We have been using silver alginate under 4-layer compression I have been trying to get him to be compliant with his external compression pumps 10/21; patient with 3 small wounds on the left leg and 1 on the right medial in the setting of severe lymphedema and chronic venous insufficiency. We have been using silver alginate under 4-layer compression he is using his external compression pumps twice a day 11/4; ARTERIAL STUDIES on the right show an ABI of 1.02 TBI of 0.858 with biphasic waveforms on the left 0.98 with a TBI of 0.55 and biphasic waveforms. Does not look like he has  significant arterial disease. We are treating him for lymphedema he has compression pumps. He has punched-out areas on the left anterior left lateral and right medial lower extremities 11/11; after we obtained his arterial studies I put him in 4 layer compression. He is using his compression pumps probably once a day although I have asked him to do twice. Primary dressing to the wound is silver collagen he has severe lymphedema likely secondary to chronic venous insufficiency. Wounds on the left lateral, left medial and left anterior and a small area on the right medial 12/2; the area on the right anterior lower leg has healed. We initially thought that the area medially had healed as well however when her discharge nurse came in she detected fluid in the wound simply opened up. This is actually worse than I remember this pain. The area on the left lateral potentially slightly smaller He is also complaining about pain in his left hand he says that this is actually been getting some better he has been using topical creams on this. She asked that I look at this 12/9 after last weeks issues we have 2 wounds one on the right medial lower leg and 1 on the left lateral. Both of these are in the same condition. I think because of thickened skin secondary to chronic lymphedema these wounds actually have depth of almost 0.8 cm. 12/16; the patient has 2 small but deep wounds one on the right medial and one on the left lateral. The right medial is actually the worst of these. He arrives in clinic today with absolutely terrible edema in the right leg apparently his 4-layer wrap fell down to just above his ankle he did not think about this he is apparently been continuing to use his compression pump twice a day. The left leg looks a lot better. 05/09/2020 upon evaluation today patient appears to be doing decently well in regard to his wounds. Everything is measuring smaller the right leg still has a little bit  deeper wound in the left seems to be almost completely healed in my opinion I am very pleased in general with how things are progressing. He has a 4- layer compression wrap we have been using endoform today we will probably have to use collagen just based on the  fact that we do not have endoform it is on order. 1/6; the patient's wound on the left lateral lower leg has healed. Still has 1 on the right medial. 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 Using compression pumps at home twice a day. 1/13; left lateral lower leg is still healed. He has a deep punched out rectangular shaped wound on the right medial calf. Looking down at this it appears that he is attempting to epithelialize around the edges of the wound and on the base as well. His edema is reasonably well controlled we have been using collagen with absolutely no effect 1/20; left lateral lower leg remains closed he has extremitease stockings. The area on the right medial calf I aggressively debrided last week measures larger but the surface looks better. We have been using Hydrofera Blue. We ran Oasis through his insurance but we have not seen the results of this 1/27; left lower leg wound with chronic venous insufficiency and secondary lymphedema. I did aggressive debridement on this last week the wound seems to have come in healthy looking surface using Hydrofera Blue. He was denied for Oasis 2/3; small divot in the right medial lower leg. Under illumination the walls of this divot are epithelialized however the base has slough which I removed with a curette we have been using Hydrofera Blue 2/10 small divot on the right medial lower leg pinpoint illumination at the base of this cone-shaped wound. We have been using Hydrofera Blue but I will switch to calcium alginate this week 2/17; the small divot on the right medial lower leg is fully epithelialized. There is no visible open area under illumination. He has his own  stocking for the right leg similar to the one he has been wearing on the left. 03/26/2022; READMISSION This is a now 77 year old man that we had in the clinic from 02/23/2020 through 07/05/2020. At that point he had bilateral lower extremity wounds left greater than right in the setting of severe lymphedema. He had already obtained compression pumps ordered for him I think from the wound care clinic in [facility] so I do not really have record of what he has been using. He claims to be using them once a day but there is a problem with the sleeve on the left leg. About 2 weeks ago he was hospitalized from 03/11/2022 through [id number] with diastolic congestive heart failure. 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 He was diuresed. Developed some prerenal azotemia and he has not been taking any diuretics currently. He has not been putting stockings on his legs since he got out of hospital and still has his legs dependent for long periods. Past medical history history of prostate cancer treated with prostatectomy and radiation this was apparently about 8 years ago, history of DVT on chronic Coumadin, history of lymphedema was managed for a while at the clinic in [place]. History of inguinal hernia repair in September 22, hypertension, stage IIIb chronic renal failure ABIs today were noncompressible on the right 1.12 on the left 04-02-2022 upon evaluation today patient appears to be doing well currently in regard to his legs I do feel like both areas that are draining are actually much drier than they were in the picture last week although the left is drier than the right. He is tolerating the 4-layer compression wraps at this point he did contact the pump company and they are actually working on getting  him a new compression sleeve for one of his legs which have previously popped and was not functioning properly. 04-23-2022 upon evaluation today  patient appears to be doing well currently in regard to his wounds on the legs. I am actually very pleased with where things stand and I do feel like that we are headed in the right direction. Fortunately there is no sign of active infection locally or systemically at this time. 05-07-2022 upon evaluation today patient appears to be doing well currently in regard to his wounds in fact things are showing signs of improvement which is good news I do not see too much that actually appears to be open and I am very pleased in that regard. No fevers, chills, nausea, vomiting, or diarrhea. 05-21-2022 upon evaluation today patient appears to be doing somewhat poorly in regard to drainage of his lower extremities bilaterally. The right is greater than left as far as the weeping area. Nonetheless it seems to be getting worse not better. He actually has pitting edema which is at least 2+ to the thighs and I am concerned about the fact that he is may be fluid overloaded in general and that is the reason why we cannot get this under control. I know he is not using his pumps all the time because he actually told the nurse that he was either going to pump or he was going to use his fluid pills but not do both. For that reason I do think that he needs to be really doing both in order to get the fluid out as effectively as possible obviously with the 4-layer compression wraps were doing as much as we can from a compression standpoint but it is really not enough. He tells me that he elevates his leg is much as he can in between pumping and other activity throughout the day. 05-28-2022 upon evaluation today patient appears to be doing better in regard to his wounds although the measurements may be a little bit larger this is a very [name], [name] ([id number]) [id number] Page 7 of 9 difficult wound to heal it is very indistinct in a lot of areas. Nonetheless there is can be some need for sharp  debridement in regard to both medial and lateral legs. Fortunately I see no signs of active infection locally nor systemically at this time. No fevers, chills, nausea, vomiting, or diarrhea. 06-04-2022 upon evaluation today patient appears to be doing poorly in general in regard to the wounds on his legs. He still continues to have a tremendous amount of fluid not just in the lower portion of his leg but to be honest his thigh where he has 2-3+ pitting edema in the thigh as well. Unfortunately I do not know that we will be able to get this healed effectively and keep it healed on the lower extremities unless he gets the overall fluid situation taking and under control. Fortunately I do not see any signs of infection locally nor systemically which is great news. He just seems to be very fluid overloaded. 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 He has been tolerating the dressing changes with the compression wraps without complication. Fortunately I do not see any evidence of infection at this time which is great news. No fevers, chills, nausea, vomiting, or diarrhea. 06-18-2022 upon evaluation today patient appears to be doing well currently in regard to his wounds as far as not looking like they  are terribly infected but nonetheless I am concerned about a subacute infection secondary to the fact that he continues to have spreading despite the compression therapy. We actually did do an Unna boot on him last week this is actually the first wrap that actually stayed up everything else has been sliding down quite significantly. Fortunately there does not appear to be any signs of infection systemically at this time. With that being said I do believe that locally there seems to be an issue going on here and again I [name] Peer do a PCR culture to see what that shows also think that I am going to put  him on a broad-spectrum antibiotic, doxycycline to see how that will help as well. He does tell me that coming into the clinic today that he was feeling short of breath like "he was about to have a heart attack" because he was having such a hard time breathing. He says that he told this to Dr. Debbora Dus his cardiologist as well when he was evaluated in the past 1 to 2 weeks. 07-02-2022 upon evaluation today patient appears to be doing poorly currently in regard to his wound. He has been tolerating the dressing changes. Unfortunately he has not had any compression wraps on for the past week because he was unable to make it in for his appointment last week. With that being said he has a significant amount of drainage he tells me has been using his pumps but despite this in the pumps he still has been draining quite a bit. The drainage is also somewhat purulent unfortunately. We did attempt to get in touch with his cardiologist last week unfortunately we were unable to get up with him I did advise that the patient needs to get in touch with him upon leaving today in order to make sure they know he is on the new antibiotics I am going to send him this will be Levaquin and Augmentin. 07-09-2022 upon evaluation today patient appears to be doing about the same in regard to his legs he may have just a slight amount of improvement with regard to the drainage probably [place]stone topical antibiotics are helping in this regard to some degree. Fortunately there does not appear to be any signs of active infection systemically which is great news. No fevers, chills, nausea, vomiting, or diarrhea. 07-16-2022 upon evaluation today patient appears to be doing well currently in regard to his wound. He has been tolerating the dressing changes without complication. Fortunately there does not appear to be any signs of active infection locally nor systemically at this time. With that being said he cannot keep the wraps up he tells  me on the left side he had to cut this down because it got too tight. He has been using his pumps but he is on the right side the wrap actually straight down causing some pushing around the central part of his leg just below the calf I think this is a bigger risk for him that help at this point. I think that we may need to try something different he should be getting his compression socks shortly he tells me they were ordered last Thursday. Objective Constitutional Well-nourished and well-hydrated in no acute distress. Vitals Time Taken: 2:26 PM, Height: 74 in, Weight: 250 lbs, BMI: 32.1, Temperature: 98.2 F, Pulse: 101 bpm, Respiratory Rate: 18 breaths/min, Blood Pressure: 185/74 mmHg. Respiratory normal breathing without difficulty. Psychiatric this patient is able to make decisions and demonstrates good insight into disease  process. Alert and Oriented x 3. pleasant and cooperative. General Notes: Upon inspection patient's wound bed actually showed signs of again still having weeping on his legs there is no specific open wounds but again we need to try to keep the wrap from sliding down and keep him from removing them. I think that this is one of the biggest concerns at this point in regard to his treatment course and plan. With that being said he is in agreement with trying the Ace wrap's over the next week and see how things go. Integumentary (Hair, Skin) Wound #6 status is Open. Original cause of wound was Gradually Appeared. The date acquired was: 03/05/2022. The wound has been in treatment 16 weeks. The wound is located on the Right,Medial Lower Leg. The wound measures 10cm length x 21cm width x 0.1cm depth; 164.934cm^2 area and 16.493cm^3 volume. There is Fat Layer (Subcutaneous Tissue) exposed. There is a large amount of serosanguineous drainage noted. The wound margin is distinct with the outline attached to the wound base. There is small (1-33%) pink granulation within the wound bed.  There is a large (67-100%) amount of necrotic tissue within the wound bed including Adherent Slough. The periwound skin appearance exhibited: Maceration. The periwound skin appearance did not exhibit: Callus, Crepitus, Excoriation, Induration, Rash, Scarring, Dry/Scaly, Atrophie Blanche, Cyanosis, Ecchymosis, Hemosiderin Staining, Mottled, Pallor, Rubor, Erythema. Wound #7 status is Open. Original cause of wound was Gradually Appeared. The date acquired was: 03/05/2022. The wound has been in treatment 16 weeks. The wound is located on the Left,Circumferential Lower Leg. The wound measures 14cm length x 21.5cm width x 0.1cm depth; 236.405cm^2 area and 23.64cm^3 volume. There is Fat Layer (Subcutaneous Tissue) exposed. There is a medium amount of serosanguineous drainage noted. The wound margin is distinct with the outline attached to the wound base. There is large (67-100%) pink, pale granulation within the wound bed. There is a small (1-33%) amount of necrotic tissue within the wound bed including Adherent Slough. The periwound skin appearance exhibited: Hemosiderin Staining. The periwound skin appearance did not exhibit: Callus, Crepitus, Excoriation, Induration, Rash, Scarring, Dry/Scaly, Maceration, Atrophie Blanche, Cyanosis, Ecchymosis, Mottled, Pallor, Rubor, Erythema. 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 - Dr. Deborah Chalk Wednesday 07/23/2022 130pm Other: - Will call you cardiologist/Primary Care Provider to make aware of your shortness of breath  with exertion, fluid overload in both legs, and elevated blood pressure. ****Please bring the topical antibiotics at each visit.**** See your primary care provider about oral antibiotics. Call [employer] company about your compression garments-where they are in the mail. ***Change daily. [employer] will send you medical supplies in the mail.**** Anesthetic: (In clinic) Topical Lidocaine 5% applied to wound bed Bathing/ Shower/ Hygiene: May shower with protection but do not get wound dressing(s) wet. Protect dressing(s) with water repellant cover (for example, large plastic bag) or a cast cover and may then take shower. Edema Control - Lymphedema / SCD / Other: Lymphedema Pumps. Use Lymphedema pumps on leg(s) 2-3 times a day for 45-60 minutes. If wearing any wraps or hose, do not remove them. Continue exercising as instructed. Elevate legs to the level of the heart or above for  30 minutes daily and/or when sitting for 3-4 times a day throughout the day. Avoid standing for long periods of time. Exercise regularly Compression stocking or Garment 20-30 mm/Hg pressure to: - once they arrive remove ace wraps; apply in the morning and remove at night. WOUND #6: - Lower Leg Wound Laterality: Right, Medial Cleanser: Soap and Water 1 x Per Day/30 Days Discharge Instructions: May shower and wash wound with dial antibacterial soap and water prior to dressing change. Peri-Wound Care: Sween Lotion (Moisturizing lotion) 1 x Per Day/30 Days Discharge Instructions: Apply moisturizing lotion as directed Prim Dressing: Maxorb Extra Ag+ Alginate Dressing, 4x4.75 (in/in) 1 x Per Day/30 Days ary Discharge Instructions: Apply to wound bed as instructed Prim Dressing: Compounding topical antibiotics- Lavon 1 x Per Day/30 Days ary Discharge Instructions: apply under alignate Ag when patient brings with appts. Secondary Dressing: ABD Pad, 5x9 1 x Per Day/30 Days Discharge Instructions: Apply over primary  dressing as directed. Secondary Dressing: Woven Gauze Sponge, Non-Sterile 4x4 in 1 x Per Day/30 Days Discharge Instructions: Apply over primary dressing as directed. Secured With: [employer], 4.5x3.1 (in/yd) 1 x Per Day/30 Days Discharge Instructions: Secure with Kerlix as directed. Secured With: 48M Medipore H Soft Cloth Surgical T ape, 4 x 10 (in/yd) 1 x Per Day/30 Days Discharge Instructions: Secure with tape as directed. Secured With: ace wrap 1 x Per Day/30 Days Discharge Instructions: apply to legs until compression garments arrive WOUND #7: - Lower Leg Wound Laterality: Left, Circumferential Cleanser: Soap and Water 1 x Per Day/30 Days Discharge Instructions: May shower and wash wound with dial antibacterial soap and water prior to dressing change. Peri-Wound Care: Sween Lotion (Moisturizing lotion) 1 x Per Day/30 Days Discharge Instructions: Apply moisturizing lotion as directed Prim Dressing: Maxorb Extra Ag+ Alginate Dressing, 4x4.75 (in/in) 1 x Per Day/30 Days ary Discharge Instructions: Apply to wound bed as instructed Prim Dressing: Compounding topical antibiotics- [place] 1 x Per Day/30 Days ary Discharge Instructions: apply under alignate Ag when patient brings with appts. Secondary Dressing: ABD Pad, 5x9 1 x Per Day/30 Days Discharge Instructions: Apply over primary dressing as directed. Secondary Dressing: Woven Gauze Sponge, Non-Sterile 4x4 in 1 x Per Day/30 Days Discharge Instructions: Apply over primary dressing as directed. Secured With: [employer], 4.5x3.1 (in/yd) 1 x Per Day/30 Days Discharge Instructions: Secure with Kerlix as directed. Secured With: 48M Medipore H Soft Cloth Surgical T ape, 4 x 10 (in/yd) 1 x Per Day/30 Days Discharge Instructions: Secure with tape as directed. Com pression Wrap: Ace wrap 1 x Per Day/30 Days Discharge Instructions: apply to legs until compression garments arrive. 1. I would recommend currently that we  have the patient continue to monitor for any signs of infection or worsening. Based on what I am seeing I do feel like that he is doing about the same. With that being said his compression wraps are really not staying up unfortunately. For that reason we need to see about trying something different for now since he is having such an issue with the compression wraps my suggestion would be that we actually have him use an Ace wrap over top of the alginate and ABD pads. 2. I would recommend that the swelling that is since he gets the compression socks and getting this over top of the dressings as opposed to the Ace wrap. 3. I am also going to suggest he should continue using his lymphedema pumps and elevate his legs much as possible. We will  see patient back for reevaluation in 1 week here in the clinic. If anything worsens or changes patient will contact our office for additional recommendations. Electronic Signature(s) Signed: 07/16/2022 3:27:15 PM By: Worthy Keeler PA-C Entered By: Worthy Keeler on 07/16/2022 15:27:14 Lindeman, [name] ([id number]) [id number] Page 9 of 9 -------------------------------------------------------------------------------- SuperBill Details Patient Name: Date of Service: Oquendo, [name] 07/16/2022 Medical Record Number: [id number] Patient Account Number: [id number] Date of Birth/Sex: Treating RN: 07-Aug-1945 (77 y.o. [name], [name] Primary Care Provider: [name] Other Clinician: Referring Provider: Treating Provider/Extender: [name] Weeks in Treatment: 16 Diagnosis Coding ICD-10 Codes Code Description [id number] Chronic venous hypertension (idiopathic) with ulcer and inflammation of bilateral lower extremity I89.0 Lymphedema, not elsewhere classified 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 Facility Procedures : CPT4 Code: [id number] Description: 99214 - WOUND CARE VISIT-LEV 4 EST PT Modifier: Quantity: 1 Physician Procedures : CPT4 Code Description Modifier [id number] - WC PHYS LEVEL 3 - EST PT ICD-10 Diagnosis Description 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

## 2022-07-17 NOTE — Progress Notes (Signed)
Sardo, [name] ([id number]) [id number] Page 1 of 8 Visit Report for 07/16/2022 Arrival Information Details Patient Name: Date of Service: [name], [name] 07/16/2022 2:15 PM Medical Record Number: [id number] Patient Account Number: [id number] Date of Birth/Sex: Treating RN: 1945/09/08 (77 y.o. M) Primary Care [name]: [name] Other Clinician: Referring [name]: Treating [name]/Extender: [name] in Treatment: 16 Visit Information History Since Last Visit Added or deleted any medications: No Patient Arrived: Cane Any new allergies or adverse reactions: No Arrival Time: 14:25 Had a fall or experienced change in No Accompanied By: wife activities of daily living that may affect Transfer Assistance: None risk of falls: Patient Identification Verified: Yes Signs or symptoms of abuse/neglect since last visito No Secondary Verification Process Completed: Yes Hospitalized since last visit: No Patient Requires Transmission-Based Precautions: No Implantable device outside of the clinic excluding No Patient Has Alerts: Yes cellular tissue based products placed in the center Patient Alerts: Patient on Blood Thinner since last visit: Right ABI in clinic [name] Has Dressing in Place as Prescribed: Yes Pain Present Now: No Electronic Signature(s) Signed: 07/16/2022 3:49:00 PM By: [name] Entered By: [name] on 07/16/2022 14:26:45 -------------------------------------------------------------------------------- Clinic Level of Care Assessment Details Patient Name: Date of Service: [name] 07/16/2022 2:15 PM Medical Record Number: [id number] Patient Account Number: [id number] Date of Birth/Sex: Treating RN: 08-09-1945 (77 y.o. [name], [name] Primary Care [name]: [name] Other Clinician: Referring Latecia Miler: Treating Rosanna Bickle/Extender: [name] in Treatment: 16 Clinic  Level of Care Assessment Items TOOL 4 Quantity Score X- 1 0 Use when only an EandM is performed on FOLLOW-UP visit ASSESSMENTS - Nursing Assessment / Reassessment X- 1 10 Reassessment of Co-morbidities (includes updates in patient status) X- 1 5 Reassessment of Adherence to Treatment Plan ASSESSMENTS - Wound and Skin A ssessment / Reassessment '[]'$  - 0 Simple Wound Assessment / Reassessment - one wound X- 2 5 Complex Wound Assessment / Reassessment - multiple wounds X- 1 10 Dermatologic / Skin Assessment (not related to wound area) ASSESSMENTS - Focused Assessment '[]'$  - 0 Circumferential Edema Measurements - multi extremities '[]'$  - 0 Nutritional Assessment / Counseling / Intervention '[]'$  - 0 Lower Extremity Assessment (monofilament, tuning fork, pulses) '[]'$  - 0 Peripheral Arterial Disease Assessment (using hand held doppler) ASSESSMENTS - Ostomy and/or Continence Assessment and Care '[]'$  - 0 Incontinence Assessment and Management '[]'$  - 0 Ostomy Care Assessment and Management (repouching, etc.) PROCESS - Coordination of Care '[]'$  - 0 Simple Patient / Family Education for ongoing care Daughtrey, [name] ([id number]) [id number].pdf Page 2 of 8 X- 1 20 Complex (extensive) Patient / Family Education for ongoing care X- 1 10 Staff obtains Programmer, systems, Records, T Results / Process Orders est '[]'$  - 0 Staff telephones HHA, Nursing Homes / Clarify orders / etc '[]'$  - 0 Routine Transfer to another Facility (non-emergent condition) '[]'$  - 0 Routine Hospital Admission (non-emergent condition) '[]'$  - 0 New Admissions / Biomedical engineer / Ordering NPWT Apligraf, etc. , '[]'$  - 0 Emergency Hospital Admission (emergent condition) '[]'$  - 0 Simple Discharge Coordination X- 1 15 Complex (extensive) Discharge Coordination PROCESS - Special Needs '[]'$  - 0 Pediatric / Minor Patient Management '[]'$  - 0 Isolation Patient Management '[]'$  - 0 Hearing / Language / Visual special needs '[]'$   - 0 Assessment of Community assistance (transportation, D/C planning, etc.) '[]'$  - 0 Additional assistance / Altered mentation '[]'$  - 0 Support Surface(s) Assessment (bed, cushion, seat, etc.) INTERVENTIONS - Wound Cleansing /  Measurement '[]'$  - 0 Simple Wound Cleansing - one wound X- 2 5 Complex Wound Cleansing - multiple wounds X- 1 5 Wound Imaging (photographs - any number of wounds) '[]'$  - 0 Wound Tracing (instead of photographs) '[]'$  - 0 Simple Wound Measurement - one wound X- 2 5 Complex Wound Measurement - multiple wounds INTERVENTIONS - Wound Dressings '[]'$  - 0 Small Wound Dressing one or multiple wounds '[]'$  - 0 Medium Wound Dressing one or multiple wounds X- 2 20 Large Wound Dressing one or multiple wounds '[]'$  - 0 Application of Medications - topical '[]'$  - 0 Application of Medications - injection INTERVENTIONS - Miscellaneous '[]'$  - 0 External ear exam '[]'$  - 0 Specimen Collection (cultures, biopsies, blood, body fluids, etc.) '[]'$  - 0 Specimen(s) / Culture(s) sent or taken to Lab for analysis '[]'$  - 0 Patient Transfer (multiple staff / Civil Service fast streamer / Similar devices) '[]'$  - 0 Simple Staple / Suture removal (25 or less) '[]'$  - 0 Complex Staple / Suture removal (26 or more) '[]'$  - 0 Hypo / Hyperglycemic Management (close monitor of Blood Glucose) '[]'$  - 0 Ankle / Brachial Index (ABI) - do not check if billed separately X- 1 5 Vital Signs Has the patient been seen at the hospital within the last three years: Yes Total Score: 150 Level Of Care: New/Established - Level 4 Electronic Signature(s) Signed: 07/16/2022 6:07:49 PM By: [name], [name] Entered By: Deon Pilling on 07/16/2022 15:08:10 [name], [name] ([id number]) [id number] Page 3 of 8 -------------------------------------------------------------------------------- Encounter Discharge Information Details Patient Name: Date of Service: [name], [name] 07/16/2022 2:15 PM Medical Record Number:  [id number] Patient Account Number: [id number] Date of Birth/Sex: Treating RN: [birth year] (77 y.o. [name] Primary Care Icelyn Navarrete: [name] Other Clinician: Referring Dahmir Epperly: Treating Dalonda Simoni/Extender: [name] in Treatment: 16 Encounter Discharge Information Items Discharge Condition: Stable Ambulatory Status: Cane Discharge Destination: Home Transportation: Private Auto Accompanied By: wife Schedule Follow-up Appointment: Yes Clinical Summary of Care: Electronic Signature(s) Signed: 07/16/2022 6:07:49 PM By: [name], [name] Entered By: Deon Pilling on 07/16/2022 15:10:03 -------------------------------------------------------------------------------- [place] Details Patient Name: Date of Service: [name], [name] 07/16/2022 2:15 PM Medical Record Number: [id number] Patient Account Number: [id number] Date of Birth/Sex: Treating RN: 10/16/1945 (76 y.o. [name] Primary Care Eben Choinski: [name] Other Clinician: Referring Echo Allsbrook: Treating Khylie Larmore/Extender: [name] Weeks in Treatment: 16 Active Inactive Wound/Skin Impairment Nursing Diagnoses: Impaired tissue integrity Knowledge deficit related to ulceration/compromised skin integrity Goals: Patient will have a decrease in wound volume by X% from date: (specify in notes) Date Initiated: 03/26/2022 Target Resolution Date: 07/18/2022 Goal Status: Active Patient/caregiver will verbalize understanding of skin care regimen Date Initiated: 03/26/2022 Target Resolution Date: 07/18/2022 Goal Status: Active Ulcer/skin breakdown will have a volume reduction of 30% by week 4 Date Initiated: 03/26/2022 Date Inactivated: 05/21/2022 Target Resolution Date: 05/17/2022 Unmet Reason: see wound Goal Status: Unmet measurement. Ulcer/skin breakdown will have a volume reduction of 50% by week 8 Date Initiated: 03/26/2022 Date  Inactivated: 05/21/2022 Target Resolution Date: 05/17/2022 Unmet Reason: see wound Goal Status: Unmet measurement. Interventions: Assess patient/caregiver ability to obtain necessary supplies Assess patient/caregiver ability to perform ulcer/skin care regimen upon admission and as needed Assess ulceration(s) every visit Notes: Patient stated today, "I will take my fluid pill or pump not do both." Christos Mixson made aware. Electronic Signature(s) Signed: 07/16/2022 6:07:49 PM By: [name], [name] [name], [name] ([id number]) [id number] Page 4 of 8 Entered  By: Deon Pilling on 07/16/2022 14:59:33 -------------------------------------------------------------------------------- Pain Assessment Details Patient Name: Date of Service: [place], [name] 07/16/2022 2:15 PM Medical Record Number: [id number] Patient Account Number: [id number] Date of Birth/Sex: Treating RN: 12/19/1945 (77 y.o. M) Primary Care Bettylou Frew: [name] Other Clinician: Referring Terecia Plaut: Treating Zamia Tyminski/Extender: [name] in Treatment: 16 Active Problems Location of Pain Severity and Description of Pain Patient Has Paino Patient Unable to Respond Site Locations Rate the pain. Current Pain Level: 7 Pain Management and Medication Current Pain Management: Electronic Signature(s) Signed: 07/16/2022 3:49:00 PM By: [name] Entered By: [name] on 07/16/2022 14:27:41 -------------------------------------------------------------------------------- Patient/Caregiver Education Details Patient Name: Date of Service: [place], [name] 2/28/2024andnbsp2:15 PM Medical Record Number: [id number] Patient Account Number: [id number] Date of Birth/Gender: Treating RN: 1945-10-26 (77 y.o. [name] Primary Care Physician: [name] Other Clinician: Referring Physician: Treating Physician/Extender: [name] in  Treatment: 16 Education Assessment Education Provided To: Patient Education Topics Provided Wound/Skin Impairment: Handouts: Caring for Your Ulcer Methods: Explain/Verbal Responses: Reinforcements needed Electronic Signature(s) Signed: 07/16/2022 6:07:49 PM By: [name], [name] Entered By: Deon Pilling on 07/16/2022 14:59:44 [name], [name] ([id number]) [id number] Page 5 of 8 -------------------------------------------------------------------------------- Wound Assessment Details Patient Name: Date of Service: [place], [name] 07/16/2022 2:15 PM Medical Record Number: [id number] Patient Account Number: [id number] Date of Birth/Sex: Treating RN: 17-Jun-1945 (77 y.o. [name], [name] Primary Care Olivya Sobol: [name] Other Clinician: Referring Gorge Almanza: Treating Jaelynne Hockley/Extender: [name] Weeks in Treatment: 16 Wound Status Wound Number: 6 Primary Lymphedema Etiology: Wound Location: Right, Medial Lower Leg Wound Open Wounding Event: Gradually Appeared Status: Date Acquired: 03/05/2022 Comorbid Anemia, Lymphedema, Deep Vein Thrombosis, Hypertension, Weeks Of Treatment: 16 History: Received Radiation Clustered Wound: Yes Photos Wound Measurements Length: (cm) 10 Width: (cm) 21 Depth: (cm) 0.1 Clustered Quantity: 2 Area: (cm) 164.934 Volume: (cm) 16.493 % Reduction in Area: -15.4% % Reduction in Volume: -15.4% Epithelialization: Small (1-33%) Wound Description Classification: Full Thickness Without Exposed Sup Wound Margin: Distinct, outline attached Exudate Amount: Large Exudate Type: Serosanguineous Exudate Color: red, brown port Structures Foul Odor After Cleansing: No Slough/Fibrino Yes Wound Bed Granulation Amount: Small (1-33%) Exposed Structure Granulation Quality: Pink Fascia Exposed: No Necrotic Amount: Large (67-100%) Fat Layer (Subcutaneous Tissue) Exposed: Yes Necrotic Quality: Adherent  Slough Tendon Exposed: No Muscle Exposed: No Joint Exposed: No Bone Exposed: No Periwound Skin Texture Texture Color No Abnormalities Noted: No No Abnormalities Noted: No Callus: No Atrophie Blanche: No Crepitus: No Cyanosis: No Excoriation: No Ecchymosis: No Induration: No Erythema: No Rash: No Hemosiderin Staining: No Scarring: No Mottled: No Pallor: No Moisture Rubor: No No Abnormalities Noted: No Dry / Scaly: No Maceration: Yes [name], [name] ([id number]) [phone].pdf Page 6 of 8 Treatment Notes Wound #6 (Lower Leg) Wound Laterality: Right, Medial Cleanser Soap and Water Discharge Instruction: May shower and wash wound with dial antibacterial soap and water prior to dressing change. Peri-Wound Care Sween Lotion (Moisturizing lotion) Discharge Instruction: Apply moisturizing lotion as directed Topical Primary Dressing Maxorb Extra Ag+ Alginate Dressing, 4x4.75 (in/in) Discharge Instruction: Apply to wound bed as instructed Compounding topical antibiotics- [facility] Discharge Instruction: apply under alignate Ag when patient brings with appts. Secondary Dressing ABD Pad, 5x9 Discharge Instruction: Apply over primary dressing as directed. Woven Gauze Sponge, Non-Sterile 4x4 in Discharge Instruction: Apply over primary dressing as directed. Secured With [employer], 4.5x3.1 (in/yd) Discharge Instruction: Secure with Kerlix as directed. 65M Medipore H  Soft Cloth Surgical T ape, 4 x 10 (in/yd) Discharge Instruction: Secure with tape as directed. ace wrap Discharge Instruction: apply to legs until compression garments arrive Compression Wrap Compression Stockings Add-Ons Electronic Signature(s) Signed: 07/16/2022 6:07:49 PM By: [name], [name] Entered By: Deon Pilling on 07/16/2022 14:42:39 -------------------------------------------------------------------------------- Wound Assessment Details Patient Name: Date  of Service: [name], [name] 07/16/2022 2:15 PM Medical Record Number: [id number] Patient Account Number: [id number] Date of Birth/Sex: Treating RN: 11-17-1945 (77 y.o. [name] Primary Care Jailen Lung: [name] Other Clinician: Referring Peola Joynt: Treating Loryn Haacke/Extender: [name] Weeks in Treatment: 16 Wound Status Wound Number: 7 Primary Lymphedema Etiology: Wound Location: Left, Circumferential Lower Leg Wound Open Wounding Event: Gradually Appeared Status: Date Acquired: 03/05/2022 Comorbid Anemia, Lymphedema, Deep Vein Thrombosis, Hypertension, Weeks Of Treatment: 16 History: Received Radiation Clustered Wound: Yes Photos [name], [name] ([id number] Page 7 of 8 Wound Measurements Length: (cm) 14 Width: (cm) 21.5 Depth: (cm) 0.1 Clustered Quantity: 5 Area: (cm) 236.405 Volume: (cm) 23.64 % Reduction in Area: -401.7% % Reduction in Volume: -401.7% Epithelialization: Small (1-33%) Wound Description Classification: Full Thickness Without Exposed Sup Wound Margin: Distinct, outline attached Exudate Amount: Medium Exudate Type: Serosanguineous Exudate Color: red, brown port Structures Foul Odor After Cleansing: No Slough/Fibrino Yes Wound Bed Granulation Amount: Large (67-100%) Exposed Structure Granulation Quality: Pink, Pale Fascia Exposed: No Necrotic Amount: Small (1-33%) Fat Layer (Subcutaneous Tissue) Exposed: Yes Necrotic Quality: Adherent Slough Tendon Exposed: No Muscle Exposed: No Joint Exposed: No Bone Exposed: No Periwound Skin Texture Texture Color No Abnormalities Noted: No No Abnormalities Noted: No Callus: No Atrophie Blanche: No Crepitus: No Cyanosis: No Excoriation: No Ecchymosis: No Induration: No Erythema: No Rash: No Hemosiderin Staining: Yes Scarring: No Mottled: No Pallor: No Moisture Rubor: No No Abnormalities Noted: No Dry / Scaly:  No Maceration: No Treatment Notes Wound #7 (Lower Leg) Wound Laterality: Left, Circumferential Cleanser Soap and Water Discharge Instruction: May shower and wash wound with dial antibacterial soap and water prior to dressing change. Peri-Wound Care Sween Lotion (Moisturizing lotion) Discharge Instruction: Apply moisturizing lotion as directed Topical Primary Dressing Maxorb Extra Ag+ Alginate Dressing, 4x4.75 (in/in) Discharge Instruction: Apply to wound bed as instructed Compounding topical antibiotics- [facility] Discharge Instruction: apply under alignate Ag when patient brings with appts. Secondary Dressing ABD Pad, 5x9 Discharge Instruction: Apply over primary dressing as directed. [name], [name] ([id number]) [id number] Page 8 of 8 Woven Gauze Sponge, Non-Sterile 4x4 in Discharge Instruction: Apply over primary dressing as directed. Secured With [employer], 4.5x3.1 (in/yd) Discharge Instruction: Secure with Kerlix as directed. 52M Medipore H Soft Cloth Surgical T ape, 4 x 10 (in/yd) Discharge Instruction: Secure with tape as directed. Compression Wrap Ace wrap Discharge Instruction: apply to legs until compression garments arrive. Compression Stockings Add-Ons Electronic Signature(s) Signed: 07/16/2022 6:07:49 PM By: [name], [name] Entered By: Deon Pilling on 07/16/2022 14:43:19 -------------------------------------------------------------------------------- Vitals Details Patient Name: Date of Service: [name], [name] 07/16/2022 2:15 PM Medical Record Number: [id number] Patient Account Number: [id number] Date of Birth/Sex: Treating RN: 1945-06-28 (77 y.o. M) Primary Care Leighana Neyman: [name] Other Clinician: Referring Albena Comes: Treating Keyvin Rison/Extender: [name] Weeks in Treatment: 16 Vital Signs Time Taken: 14:26 Temperature (F): 98.2 Height (in): 74 Pulse (bpm): 101 Weight  (lbs): 250 Respiratory Rate (breaths/min): 18 Body Mass Index (BMI): 32.1 Blood Pressure (mmHg): 185/74 Reference Range: 80 - 120 mg / dl Electronic Signature(s) Signed: 07/16/2022 3:49:00 PM By: [name] Entered  By: [name] on 07/16/2022 14:27:23

## 2022-07-23 ENCOUNTER — Encounter (HOSPITAL_BASED_OUTPATIENT_CLINIC_OR_DEPARTMENT_OTHER): Payer: Medicare Other | Attending: Physician Assistant | Admitting: Internal Medicine

## 2022-07-23 DIAGNOSIS — L97828 Non-pressure chronic ulcer of other part of left lower leg with other specified severity: Secondary | ICD-10-CM | POA: Insufficient documentation

## 2022-07-23 DIAGNOSIS — I87333 Chronic venous hypertension (idiopathic) with ulcer and inflammation of bilateral lower extremity: Secondary | ICD-10-CM | POA: Diagnosis present

## 2022-07-23 DIAGNOSIS — N1832 Chronic kidney disease, stage 3b: Secondary | ICD-10-CM | POA: Diagnosis not present

## 2022-07-23 DIAGNOSIS — I130 Hypertensive heart and chronic kidney disease with heart failure and stage 1 through stage 4 chronic kidney disease, or unspecified chronic kidney disease: Secondary | ICD-10-CM | POA: Insufficient documentation

## 2022-07-23 DIAGNOSIS — L97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 other part of right lower leg with other specified severity: Secondary | ICD-10-CM | POA: Diagnosis not present

## 2022-07-23 DIAGNOSIS — Z8546 Personal history of malignant neoplasm of prostate: Secondary | ICD-10-CM | POA: Diagnosis not present

## 2022-07-23 DIAGNOSIS — I890 Lymphedema, not elsewhere classified: Secondary | ICD-10-CM | POA: Diagnosis not present

## 2022-07-23 DIAGNOSIS — Z86718 Personal history of other venous thrombosis and embolism: Secondary | ICD-10-CM | POA: Insufficient documentation

## 2022-07-26 NOTE — Progress Notes (Signed)
[name], [name] ([id number] Page 1 of 10 Visit Report for 07/23/2022 Debridement Details Patient Name: Date of Service: [name], [name] 07/23/2022 1:30 PM Medical Record Number: [id number] Patient Account Number: [id number] Date of Birth/Sex: Treating RN: 1946/02/27 (77 y.o. [name] Primary Care Provider: [name] Other Clinician: Referring Provider: Treating Provider/Extender: Drucilla Schmidt Weeks in Treatment: 17 Debridement Performed for Assessment: Wound #6 Right,Medial Lower Leg Performed By: Physician Ricard Dillon., MD Debridement Type: Debridement Level of Consciousness (Pre-procedure): Awake and Alert Pre-procedure Verification/Time Out Yes - 14:25 Taken: Start Time: 14:31 T Area Debrided (L x W): otal 12 (cm) x 16 (cm) = 192 (cm) Tissue and other material debrided: Viable, Non-Viable, Slough, Subcutaneous, Skin: Dermis , Skin: Epidermis, Slough Level: Skin/Subcutaneous Tissue Debridement Description: Excisional Instrument: Curette Bleeding: Minimum Hemostasis Achieved: Pressure End Time: 14:35 Procedural Pain: 0 Post Procedural Pain: 0 Response to Treatment: Procedure was tolerated well Level of Consciousness (Post- Awake and Alert procedure): Post Debridement Measurements of Total Wound Length: (cm) 12 Width: (cm) 16 Depth: (cm) 0.1 Volume: (cm) 15.08 Character of Wound/Ulcer Post Debridement: Improved Post Procedure Diagnosis Same as Pre-procedure Electronic Signature(s) Signed: 07/23/2022 4:27:04 PM By: Linton Ham MD Signed: 07/23/2022 5:05:26 PM By: [name], [name] Entered By: Deon Pilling on 07/23/2022 14:36:41 -------------------------------------------------------------------------------- Debridement Details Patient Name: Date of Service: [name], [name] 07/23/2022 1:30 PM Medical Record Number: [id number] Patient Account Number: [id number] Date of Birth/Sex: Treating  RN: 1946/02/04 (77 y.o. [name] Primary Care Provider: [name] Other Clinician: Referring Provider: Treating Provider/Extender: Drucilla Schmidt Weeks in Treatment: 17 Debridement Performed for Assessment: Wound #7 Left,Circumferential Lower Leg Performed By: Physician Ricard Dillon., MD Debridement Type: Debridement Level of Consciousness (Pre-procedure): Awake and Alert Pre-procedure Verification/Time Out Yes - 14:25 Taken: Start Time: 14:31 T Area Debrided (L x W): otal 3 (cm) x 3 (cm) = 9 (cm) Tissue and other material debrided: Viable, Non-Viable, Slough, Subcutaneous, Skin: Dermis , Skin: Epidermis, Slough Level: Skin/Subcutaneous Tissue Debridement Description: Excisional Instrument: Curette Bleeding: Minimum Hemostasis Achieved: Pressure End Time: 14:35 [name], [name] ([id number] Page 2 of 10 Procedural Pain: 0 Post Procedural Pain: 0 Response to Treatment: Procedure was tolerated well Level of Consciousness (Post- Awake and Alert procedure): Post Debridement Measurements of Total Wound Length: (cm) 12 Width: (cm) 11 Depth: (cm) 0.1 Volume: (cm) 10.367 Character of Wound/Ulcer Post Debridement: Improved Post Procedure Diagnosis Same as Pre-procedure Electronic Signature(s) Signed: 07/23/2022 4:27:04 PM By: Linton Ham MD Signed: 07/23/2022 5:05:26 PM By: [name], [name] Entered By: Deon Pilling on 07/23/2022 14:37:03 -------------------------------------------------------------------------------- HPI Details Patient Name: Date of Service: [name], [name] 07/23/2022 1:30 PM Medical Record Number: [id number] Patient Account Number: [id number] Date of Birth/Sex: Treating RN: 1945-08-23 (77 y.o. M) Primary Care Provider: [name] Other Clinician: Referring Provider: Treating Provider/Extender: Drucilla Schmidt Weeks in Treatment: 17 History of Present  Illness HPI Description: ADMISSION 02/23/2020 Patient is a 77 year old man who lives in [place] who arrives accompanied by his wife. He has a history of chronic lymphedema and venous insufficiency in his bilateral lower legs which may have something to do that with having a history of DVT as well as being treated for prostate cancer. In any case he recently got compression pumps at home but compliance has been an issue here. He has compression stockings however they are probably not sufficient enough to control swelling. They tell us that things deteriorated for him in  late August he was admitted to [facility] for 7 days. This was with cellulitis I think of his bilateral lower legs. Discharge he was noted to have wounds on his bilateral lower legs. He was discharged on Bactrim. They tried to get him home health through [facility] of course they declined him. His wife is been wrapping these applying some form of silver foam dressing. He has a history of wounds before although nothing that would not heal with basic home topical dressings. He has 2 areas on the left medial, left anterior and left lateral and a smaller area on the right medial. All of these have considerable depth. Past medical history includes iron deficiency anemia, lymphedema followed by the rehab center at [facility] with lymphedema wraps I believe, DVT on chronic anticoagulation, prostate cancer, chronic venous insufficiency, hypertension. As mentioned he has compression pumps but does not use them. ABIs in our clinic were noncompressible bilaterally 10/14; patient with severe bilateral lymphedema right greater than left. He came in with bilateral lower extremity wounds left greater than right. Even though the right side has more of the edema most of the wounds here almost closed on the right medial. He has 3 remaining wounds on the left We have been using silver alginate under 4-layer compression I have  been trying to get him to be compliant with his external compression pumps 10/21; patient with 3 small wounds on the left leg and 1 on the right medial in the setting of severe lymphedema and chronic venous insufficiency. We have been using silver alginate under 4-layer compression he is using his external compression pumps twice a day 11/4; ARTERIAL STUDIES on the right show an ABI of 1.02 TBI of 0.858 with biphasic waveforms on the left 0.98 with a TBI of 0.55 and biphasic waveforms. Does not look like he has significant arterial disease. We are treating him for lymphedema he has compression pumps. He has punched-out areas on the left anterior left lateral and right medial lower extremities 11/11; after we obtained his arterial studies I put him in 4 layer compression. He is using his compression pumps probably once a day although I have asked him to do twice. Primary dressing to the wound is silver collagen he has severe lymphedema likely secondary to chronic venous insufficiency. Wounds on the left lateral, left medial and left anterior and a small area on the right medial 12/2; the area on the right anterior lower leg has healed. We initially thought that the area medially had healed as well however when her discharge nurse came in she detected fluid in the wound simply opened up. This is actually worse than I remember this pain. The area on the left lateral potentially slightly smaller He is also complaining about pain in his left hand he says that this is actually been getting some better he has been using topical creams on this. She asked that I look at this 12/9 after last weeks issues we have 2 wounds one on the right medial lower leg and 1 on the left lateral. Both of these are in the same condition. I think because of thickened skin secondary to chronic lymphedema these wounds actually have depth of almost 0.8 cm. 12/16; the patient has 2 small but deep wounds one on the right medial and  one on the left lateral. The right medial is actually the worst of these. He arrives in clinic today with absolutely terrible edema in the right leg  apparently his 4-layer wrap fell down to just above his ankle he did not think about this he is apparently been continuing to use his compression pump twice a day. The left leg looks a lot better. 05/09/2020 upon evaluation today patient appears to be doing decently well in regard to his wounds. Everything is measuring smaller the right leg still has a little bit deeper wound in the left seems to be almost completely healed in my opinion I am very pleased in general with how things are progressing. He has a 4- layer compression wrap we have been using endoform today we will probably have to use collagen just based on the fact that we do not have endoform it is on Kaps, Aithan ([id number]) [phone].pdf Page 3 of 10 order. 1/6; the patient's wound on the left lateral lower leg has healed. Still has 1 on the right medial. 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 Using compression pumps at home twice a day. 1/13; left lateral lower leg is still healed. He has a deep punched out rectangular shaped wound on the right medial calf. Looking down at this it appears that he is attempting to epithelialize around the edges of the wound and on the base as well. His edema is reasonably well controlled we have been using collagen with absolutely no effect 1/20; left lateral lower leg remains closed he has extremitease stockings. The area on the right medial calf I aggressively debrided last week measures larger but the surface looks better. We have been using Hydrofera Blue. We ran Oasis through his insurance but we have not seen the results of this 1/27; left lower leg wound with chronic venous insufficiency and secondary lymphedema. I did aggressive debridement on this last week the wound seems to have come in healthy  looking surface using Hydrofera Blue. He was denied for Oasis 2/3; small divot in the right medial lower leg. Under illumination the walls of this divot are epithelialized however the base has slough which I removed with a curette we have been using Hydrofera Blue 2/10 small divot on the right medial lower leg pinpoint illumination at the base of this cone-shaped wound. We have been using Hydrofera Blue but I will switch to calcium alginate this week 2/17; the small divot on the right medial lower leg is fully epithelialized. There is no visible open area under illumination. He has his own stocking for the right leg similar to the one he has been wearing on the left. 03/26/2022; READMISSION This is a now 77 year old man that we had in the clinic from 02/23/2020 through 07/05/2020. At that point he had bilateral lower extremity wounds left greater than right in the setting of severe lymphedema. He had already obtained compression pumps ordered for him I think from the wound care clinic in [name] so I do not really have record of what he has been using. He claims to be using them once a day but there is a problem with the sleeve on the left leg. About 2 weeks ago he was hospitalized from 03/11/2022 through [id number] with diastolic congestive heart failure. 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 He was diuresed. Developed some prerenal azotemia and he has not been taking any diuretics currently. He has not been putting stockings on his legs since he got out of hospital and still has his legs dependent for long periods. Past medical history history of prostate cancer treated with  prostatectomy and radiation this was apparently about 8 years ago, history of DVT on chronic Coumadin, history of lymphedema was managed for a while at the clinic in [place]. History of inguinal hernia repair in September 22, hypertension, stage IIIb chronic renal  failure ABIs today were noncompressible on the right 1.12 on the left 04-02-2022 upon evaluation today patient appears to be doing well currently in regard to his legs I do feel like both areas that are draining are actually much drier than they were in the picture last week although the left is drier than the right. He is tolerating the 4-layer compression wraps at this point he did contact the pump company and they are actually working on getting him a new compression sleeve for one of his legs which have previously popped and was not functioning properly. 04-23-2022 upon evaluation today patient appears to be doing well currently in regard to his wounds on the legs. I am actually very pleased with where things stand and I do feel like that we are headed in the right direction. Fortunately there is no sign of active infection locally or systemically at this time. 05-07-2022 upon evaluation today patient appears to be doing well currently in regard to his wounds in fact things are showing signs of improvement which is good news I do not see too much that actually appears to be open and I am very pleased in that regard. No fevers, chills, nausea, vomiting, or diarrhea. 05-21-2022 upon evaluation today patient appears to be doing somewhat poorly in regard to drainage of his lower extremities bilaterally. The right is greater than left as far as the weeping area. Nonetheless it seems to be getting worse not better. He actually has pitting edema which is at least 2+ to the thighs and I am concerned about the fact that he is may be fluid overloaded in general and that is the reason why we cannot get this under control. I know he is not using his pumps all the time because he actually told the nurse that he was either going to pump or he was going to use his fluid pills but not do both. For that reason I do think that he needs to be really doing both in order to get the fluid out as effectively as possible  obviously with the 4-layer compression wraps were doing as much as we can from a compression standpoint but it is really not enough. He tells me that he elevates his leg is much as he can in between pumping and other activity throughout the day. 05-28-2022 upon evaluation today patient appears to be doing better in regard to his wounds although the measurements may be a little bit larger this is a very difficult wound to heal it is very indistinct in a lot of areas. Nonetheless there is can be some need for sharp debridement in regard to both medial and lateral legs. Fortunately I see no signs of active infection locally nor systemically at this time. No fevers, chills, nausea, vomiting, or diarrhea. 06-04-2022 upon evaluation today patient appears to be doing poorly in general in regard to the wounds on his legs. He still continues to have a tremendous amount of fluid not just in the lower portion of his leg but to be honest his thigh where he has 2-3+ pitting edema in the thigh as well. Unfortunately I do not know that we will be able to get this healed effectively and keep it healed on the lower  extremities unless he gets the overall fluid situation taking and under control. Fortunately I do not see any signs of infection locally nor systemically which is great news. He just seems to be very fluid overloaded. 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 He has been tolerating the dressing changes with the compression wraps without complication. Fortunately I do not see any evidence of infection at this time which is great news. No fevers, chills, nausea, vomiting, or diarrhea. 06-18-2022 upon evaluation today patient appears to be doing well currently in regard to his wounds as far as not looking like they are terribly infected but nonetheless I am concerned about a subacute infection  secondary to the fact that he continues to have spreading despite the compression therapy. We actually did do an Unna boot on him last week this is actually the first wrap that actually stayed up everything else has been sliding down quite significantly. Fortunately there does not appear to be any signs of infection systemically at this time. With that being said I do believe that locally there seems to be an issue going on here and again I Georgina Peer do a PCR culture to see what that shows also think that I am going to put him on a broad-spectrum antibiotic, doxycycline to see how that will help as well. He does tell me that coming into the clinic today that he was feeling short of breath like "he was about to have a heart attack" because he was having such a hard time breathing. He says that he told this to Dr. Debbora Dus his cardiologist as well when he was evaluated in the past 1 to 2 weeks. 07-02-2022 upon evaluation today patient appears to be doing poorly currently in regard to his wound. He has been tolerating the dressing changes. Unfortunately he has not had any compression wraps on for the past week because he was unable to make it in for his appointment last week. With that being said he has a significant amount of drainage he tells me has been using his pumps but despite this in the pumps he still has been draining quite a bit. The drainage is also somewhat purulent unfortunately. We did attempt to get in touch with his cardiologist last week unfortunately we were unable to get up with him I did advise that the patient needs to get in touch with him upon leaving today in order to make sure they know he is on the new antibiotics I am going to send him this will be Levaquin and Augmentin. 07-09-2022 upon evaluation today patient appears to be doing about the same in regard to his legs he may have just a slight amount of improvement with regard to the drainage probably Keystone topical antibiotics are  helping in this regard to some degree. Fortunately there does not appear to be any signs of active infection systemically which is great news. No fevers, chills, nausea, vomiting, or diarrhea. 07-16-2022 upon evaluation today patient appears to be doing well currently in regard to his wound. He has been tolerating the dressing changes without complication. Fortunately there does not appear to be any signs of active infection locally nor systemically at this time. With that being said he cannot keep the wraps up he tells me on the left side he had to cut this down because it got too tight. He has been using his pumps but he is on the right side  the wrap [name], [name] ([id number]) [phone].pdf Page 4 of 10 actually straight down causing some pushing around the central part of his leg just below the calf I think this is a bigger risk for him that help at this point. I think that we may need to try something different he should be getting his compression socks shortly he tells me they were ordered last Thursday. 3/6; ; this is a patient who lives in [place]. He has severe bilateral lymphedema. He has compression pumps, we have been using kerlix Ace wrap Keystone. He is changing the dressing. We do not have home health. Electronic Signature(s) Signed: 07/23/2022 4:27:04 PM By: Linton Ham MD Entered By: Linton Ham on 07/23/2022 14:42:15 -------------------------------------------------------------------------------- Physical Exam Details Patient Name: Date of Service: [name], [name] 07/23/2022 1:30 PM Medical Record Number: [id number] Patient Account Number: [id number] Date of Birth/Sex: Treating RN: November 28, 1945 (77 y.o. M) Primary Care Provider: [name] Other Clinician: Referring Provider: Treating Provider/Extender: Drucilla Schmidt Weeks in Treatment: 54 Constitutional Patient is hypertensive.. Pulse regular and within target range for  patient.Marland Kitchen Respirations regular, non-labored and within target range.. Temperature is normal and within the target range for the patient.Marland Kitchen Appears in no distress. Notes Wound exam; massive bilateral lymphedema. Wound is worse on the right than the left. On the right there is a large slough covered wound anteriorly and a small area on the left medial. There is no evidence of concurrent infection. As mentioned he is on [place]. Electronic Signature(s) Signed: 07/23/2022 4:27:04 PM By: Linton Ham MD Entered By: Linton Ham on 07/23/2022 14:44:26 -------------------------------------------------------------------------------- Physician Orders Details Patient Name: Date of Service: [name], [name] 07/23/2022 1:30 PM Medical Record Number: [id number] Patient Account Number: [id number] Date of Birth/Sex: Treating RN: 06/22/1945 (77 y.o. [name], [name] Primary Care Provider: [name] Other Clinician: Referring Provider: Treating Provider/Extender: Drucilla Schmidt Weeks in Treatment: 17 Verbal / Phone Orders: No Diagnosis Coding ICD-10 Coding Code Description [phone] Chronic venous hypertension (idiopathic) with ulcer and inflammation of bilateral lower extremity I89.0 Lymphedema, not elsewhere classified 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 other part of right lower leg with other specified severity Follow-up Appointments ppointment in 2 weeks. Margarita Grizzle Wednesday 1245 08/06/2022 overflow room 7 Return A Nurse Visit: - [id number] 07/30/2022 for dressing change. Other: - ***Change daily. ***** Anesthetic (In clinic) Topical Lidocaine 5% applied to wound bed Bathing/ Shower/ Hygiene May shower with protection but do not get wound dressing(s) wet. Protect dressing(s) with water repellant cover (for example, large plastic bag) or a cast cover and may then take shower. Edema  Control - Lymphedema / SCD / Other Lymphedema Pumps. Use Lymphedema pumps on leg(s) 2-3 times a day for 45-60 minutes. If wearing any wraps or hose, do not remove them. Continue exercising as instructed. - *****pump 3 times a day for an hour each time.***** Elevate legs to the level of the heart or above for 30 minutes daily and/or when sitting for 3-4 times a day throughout the day. - important Avoid standing for long periods of time. Exercise regularly Nulty, [name] ([id number]) [phone].pdf Page 5 of 10 Compression stocking or Garment 30-40 mm/Hg pressure to: - juxtafit Essentials Long for bilateral lower legs; quantity three juxtafit essentials per limb. Wound Treatment Wound #6 - Lower Leg Wound Laterality: Right, Medial Cleanser: Soap and Water 1 x Per Day/30 Days Discharge Instructions: May shower and  wash wound with dial antibacterial soap and water prior to dressing change. Peri-Wound Care: Sween Lotion (Moisturizing lotion) 1 x Per Day/30 Days Discharge Instructions: Apply moisturizing lotion as directed Prim Dressing: Maxorb Extra Ag+ Alginate Dressing, 4x4.75 (in/in) 1 x Per Day/30 Days ary Discharge Instructions: Apply to wound bed as instructed Prim Dressing: Compounding topical antibiotics- [place] 1 x Per Day/30 Days ary Discharge Instructions: apply under alignate Ag when patient brings with appts. Secondary Dressing: ABD Pad, 5x9 1 x Per Day/30 Days Discharge Instructions: Apply over primary dressing as directed. Secondary Dressing: Woven Gauze Sponge, Non-Sterile 4x4 in 1 x Per Day/30 Days Discharge Instructions: Apply over primary dressing as directed. Secured With: [employer], 4.5x3.1 (in/yd) 1 x Per Day/30 Days Discharge Instructions: Secure with Kerlix as directed. Secured With: 21M Medipore H Soft Cloth Surgical T ape, 4 x 10 (in/yd) 1 x Per Day/30 Days Discharge Instructions: Secure with tape as directed. Secured  With: ace wrap 1 x Per Day/30 Days Discharge Instructions: apply to legs until compression garments arrive Wound #7 - Lower Leg Wound Laterality: Left, Circumferential Cleanser: Soap and Water 1 x Per Day/30 Days Discharge Instructions: May shower and wash wound with dial antibacterial soap and water prior to dressing change. Peri-Wound Care: Sween Lotion (Moisturizing lotion) 1 x Per Day/30 Days Discharge Instructions: Apply moisturizing lotion as directed Prim Dressing: Maxorb Extra Ag+ Alginate Dressing, 4x4.75 (in/in) 1 x Per Day/30 Days ary Discharge Instructions: Apply to wound bed as instructed Prim Dressing: Compounding topical antibiotics- [place] 1 x Per Day/30 Days ary Discharge Instructions: apply under alignate Ag when patient brings with appts. Secondary Dressing: ABD Pad, 5x9 1 x Per Day/30 Days Discharge Instructions: Apply over primary dressing as directed. Secondary Dressing: Woven Gauze Sponge, Non-Sterile 4x4 in 1 x Per Day/30 Days Discharge Instructions: Apply over primary dressing as directed. Secured With: [employer], 4.5x3.1 (in/yd) 1 x Per Day/30 Days Discharge Instructions: Secure with Kerlix as directed. Secured With: 21M Medipore H Soft Cloth Surgical T ape, 4 x 10 (in/yd) 1 x Per Day/30 Days Discharge Instructions: Secure with tape as directed. Compression Wrap: Ace wrap 1 x Per Day/30 Days Discharge Instructions: apply to legs until compression garments arrive. Electronic Signature(s) Signed: 07/24/2022 4:49:59 PM By: [name], [name] Signed: 07/25/2022 2:38:35 AM By: Linton Ham MD Previous Signature: 07/23/2022 4:27:04 PM Version By: Linton Ham MD Previous Signature: 07/23/2022 5:05:26 PM Version By: [name], [name] Entered By: Deon Pilling on 07/24/2022 16:47:23 -------------------------------------------------------------------------------- Problem List Details Patient Name: Date of Service: [name], [name] 07/23/2022  1:30 PM Medical Record Number: [id number] Patient Account Number: [id number] Date of Birth/Sex: Treating RN: 1945-08-18 (77 y.o. [name], [name], [name] ([id number]) [id number] Page 6 of 10 Primary Care Provider: [name] Other Clinician: Referring Provider: Treating Provider/Extender: Drucilla Schmidt Weeks in Treatment: 17 Active Problems ICD-10 Encounter Code Description Active Date MDM Diagnosis I87.333 Chronic venous hypertension (idiopathic) with ulcer and inflammation of 06/11/2022 No Yes bilateral lower extremity I89.0 Lymphedema, not elsewhere classified 03/26/2022 No Yes L97.828 Non-pressure chronic ulcer of other part of left lower leg with other specified 03/26/2022 No Yes severity 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 other part of right lower leg with other specified 03/26/2022 No Yes severity Inactive Problems Resolved Problems Electronic Signature(s) Signed: 07/23/2022 4:27:04 PM By: Linton Ham MD Entered By: Linton Ham on 07/23/2022 14:39:37 -------------------------------------------------------------------------------- Progress Note Details Patient Name: Date of Service: [name], [name] 07/23/2022 1:30 PM Medical  Record Number: [id number] Patient Account Number: [id number] Date of Birth/Sex: Treating RN: [birth year] (77 y.o. M) Primary Care Provider: [name] Other Clinician: Referring Provider: Treating Provider/Extender: Drucilla Schmidt Weeks in Treatment: 17 Subjective History of Present Illness (HPI) ADMISSION 02/23/2020 Patient is a 77 year old man who lives in [place] who arrives accompanied by his wife. He has a history of chronic lymphedema and venous insufficiency in his bilateral lower legs which may have something to do that with having a history of DVT as well as being treated for prostate cancer. In any case he recently got compression pumps at home but  compliance has been an issue here. He has compression stockings however they are probably not sufficient enough to control swelling. They tell us that things deteriorated for him in late August he was admitted to [facility] for 7 days. This was with cellulitis I think of his bilateral lower legs. Discharge he was noted to have wounds on his bilateral lower legs. He was discharged on Bactrim. They tried to get him home health through [facility] of course they declined him. His wife is been wrapping these applying some form of silver foam dressing. He has a history of wounds before although nothing that would not heal with basic home topical dressings. He has 2 areas on the left medial, left anterior and left lateral and a smaller area on the right medial. All of these have considerable depth. Past medical history includes iron deficiency anemia, lymphedema followed by the rehab center at [facility] with lymphedema wraps I believe, DVT on chronic anticoagulation, prostate cancer, chronic venous insufficiency, hypertension. As mentioned he has compression pumps but does not use them. ABIs in our clinic were noncompressible bilaterally 10/14; patient with severe bilateral lymphedema right greater than left. He came in with bilateral lower extremity wounds left greater than right. Even though the right side has more of the edema most of the wounds here almost closed on the right medial. He has 3 remaining wounds on the left We have been using silver alginate under 4-layer compression I have been trying to get him to be compliant with his external compression pumps 10/21; patient with 3 small wounds on the left leg and 1 on the right medial in the setting of severe lymphedema and chronic venous insufficiency. We have been using silver alginate under 4-layer compression he is using his external compression pumps twice a day 11/4; Felicetti, Cephas ([id number])  [id number].pdf Page 7 of 10 ARTERIAL STUDIES on the right show an ABI of 1.02 TBI of 0.858 with biphasic waveforms on the left 0.98 with a TBI of 0.55 and biphasic waveforms. Does not look like he has significant arterial disease. We are treating him for lymphedema he has compression pumps. He has punched-out areas on the left anterior left lateral and right medial lower extremities 11/11; after we obtained his arterial studies I put him in 4 layer compression. He is using his compression pumps probably once a day although I have asked him to do twice. Primary dressing to the wound is silver collagen he has severe lymphedema likely secondary to chronic venous insufficiency. Wounds on the left lateral, left medial and left anterior and a small area on the right medial 12/2; the area on the right anterior lower leg has healed. We initially thought that the area medially had healed as well however when her discharge nurse came in she detected fluid in the wound simply opened up.  This is actually worse than I remember this pain. The area on the left lateral potentially slightly smaller He is also complaining about pain in his left hand he says that this is actually been getting some better he has been using topical creams on this. She asked that I look at this 12/9 after last weeks issues we have 2 wounds one on the right medial lower leg and 1 on the left lateral. Both of these are in the same condition. I think because of thickened skin secondary to chronic lymphedema these wounds actually have depth of almost 0.8 cm. 12/16; the patient has 2 small but deep wounds one on the right medial and one on the left lateral. The right medial is actually the worst of these. He arrives in clinic today with absolutely terrible edema in the right leg apparently his 4-layer wrap fell down to just above his ankle he did not think about this he is apparently been continuing to use his  compression pump twice a day. The left leg looks a lot better. 05/09/2020 upon evaluation today patient appears to be doing decently well in regard to his wounds. Everything is measuring smaller the right leg still has a little bit deeper wound in the left seems to be almost completely healed in my opinion I am very pleased in general with how things are progressing. He has a 4- layer compression wrap we have been using endoform today we will probably have to use collagen just based on the fact that we do not have endoform it is on order. 1/6; the patient's wound on the left lateral lower leg has healed. Still has 1 on the right medial. 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 Using compression pumps at home twice a day. 1/13; left lateral lower leg is still healed. He has a deep punched out rectangular shaped wound on the right medial calf. Looking down at this it appears that he is attempting to epithelialize around the edges of the wound and on the base as well. His edema is reasonably well controlled we have been using collagen with absolutely no effect 1/20; left lateral lower leg remains closed he has extremitease stockings. The area on the right medial calf I aggressively debrided last week measures larger but the surface looks better. We have been using Hydrofera Blue. We ran Oasis through his insurance but we have not seen the results of this 1/27; left lower leg wound with chronic venous insufficiency and secondary lymphedema. I did aggressive debridement on this last week the wound seems to have come in healthy looking surface using Hydrofera Blue. He was denied for Oasis 2/3; small divot in the right medial lower leg. Under illumination the walls of this divot are epithelialized however the base has slough which I removed with a curette we have been using Hydrofera Blue 2/10 small divot on the right medial lower leg pinpoint illumination at the base of this cone-shaped  wound. We have been using Hydrofera Blue but I will switch to calcium alginate this week 2/17; the small divot on the right medial lower leg is fully epithelialized. There is no visible open area under illumination. He has his own stocking for the right leg similar to the one he has been wearing on the left. 03/26/2022; READMISSION This is a now 77 year old man that we had in the clinic from 02/23/2020 through 07/05/2020. At that point he had bilateral lower extremity wounds left greater than right in the setting  of severe lymphedema. He had already obtained compression pumps ordered for him I think from the wound care clinic in [facility] so I do not really have record of what he has been using. He claims to be using them once a day but there is a problem with the sleeve on the left leg. About 2 weeks ago he was hospitalized from 03/11/2022 through [id number] with diastolic congestive heart failure. 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 He was diuresed. Developed some prerenal azotemia and he has not been taking any diuretics currently. He has not been putting stockings on his legs since he got out of hospital and still has his legs dependent for long periods. Past medical history history of prostate cancer treated with prostatectomy and radiation this was apparently about 8 years ago, history of DVT on chronic Coumadin, history of lymphedema was managed for a while at the clinic in [place]. History of inguinal hernia repair in September 22, hypertension, stage IIIb chronic renal failure ABIs today were noncompressible on the right 1.12 on the left 04-02-2022 upon evaluation today patient appears to be doing well currently in regard to his legs I do feel like both areas that are draining are actually much drier than they were in the picture last week although the left is drier than the right. He is tolerating the 4-layer compression wraps  at this point he did contact the pump company and they are actually working on getting him a new compression sleeve for one of his legs which have previously popped and was not functioning properly. 04-23-2022 upon evaluation today patient appears to be doing well currently in regard to his wounds on the legs. I am actually very pleased with where things stand and I do feel like that we are headed in the right direction. Fortunately there is no sign of active infection locally or systemically at this time. 05-07-2022 upon evaluation today patient appears to be doing well currently in regard to his wounds in fact things are showing signs of improvement which is good news I do not see too much that actually appears to be open and I am very pleased in that regard. No fevers, chills, nausea, vomiting, or diarrhea. 05-21-2022 upon evaluation today patient appears to be doing somewhat poorly in regard to drainage of his lower extremities bilaterally. The right is greater than left as far as the weeping area. Nonetheless it seems to be getting worse not better. He actually has pitting edema which is at least 2+ to the thighs and I am concerned about the fact that he is may be fluid overloaded in general and that is the reason why we cannot get this under control. I know he is not using his pumps all the time because he actually told the nurse that he was either going to pump or he was going to use his fluid pills but not do both. For that reason I do think that he needs to be really doing both in order to get the fluid out as effectively as possible obviously with the 4-layer compression wraps were doing as much as we can from a compression standpoint but it is really not enough. He tells me that he elevates his leg is much as he can in between pumping and other activity throughout the day. 05-28-2022 upon evaluation today patient appears to be doing better in regard to his wounds although the measurements may be  a  little bit larger this is a very difficult wound to heal it is very indistinct in a lot of areas. Nonetheless there is can be some need for sharp debridement in regard to both medial and lateral legs. Fortunately I see no signs of active infection locally nor systemically at this time. No fevers, chills, nausea, vomiting, or diarrhea. 06-04-2022 upon evaluation today patient appears to be doing poorly in general in regard to the wounds on his legs. He still continues to have a tremendous amount of fluid not just in the lower portion of his leg but to be honest his thigh where he has 2-3+ pitting edema in the thigh as well. Unfortunately I do not know that we will be able to get this healed effectively and keep it healed on the lower extremities unless he gets the overall fluid situation taking and under control. Fortunately I do not see any signs of infection locally nor systemically which is great news. He just seems to be very fluid overloaded. 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 He has been tolerating the dressing changes with the compression wraps without complication. Fortunately I do not see any evidence of infection at this time which is great news. No fevers, chills, nausea, vomiting, or diarrhea. 06-18-2022 upon evaluation today patient appears to be doing well currently in regard to his wounds as far as not looking like they are terribly infected but nonetheless I am concerned about a subacute infection secondary to the fact that he continues to have spreading despite the compression therapy. We actually did do an Unna boot on him last week this is actually the first wrap that actually stayed up everything else has been sliding down quite significantly. Kotecki, [name] ([id number] Page 8 of 10 Fortunately there does not appear to be  any signs of infection systemically at this time. With that being said I do believe that locally there seems to be an issue going on here and again I Georgina Peer do a PCR culture to see what that shows also think that I am going to put him on a broad-spectrum antibiotic, doxycycline to see how that will help as well. He does tell me that coming into the clinic today that he was feeling short of breath like "he was about to have a heart attack" because he was having such a hard time breathing. He says that he told this to Dr. Debbora Dus his cardiologist as well when he was evaluated in the past 1 to 2 weeks. 07-02-2022 upon evaluation today patient appears to be doing poorly currently in regard to his wound. He has been tolerating the dressing changes. Unfortunately he has not had any compression wraps on for the past week because he was unable to make it in for his appointment last week. With that being said he has a significant amount of drainage he tells me has been using his pumps but despite this in the pumps he still has been draining quite a bit. The drainage is also somewhat purulent unfortunately. We did attempt to get in touch with his cardiologist last week unfortunately we were unable to get up with him I did advise that the patient needs to get in touch with him upon leaving today in order to make sure they know he is on the new antibiotics I am going to send him this will be Levaquin and Augmentin. 07-09-2022 upon evaluation today patient appears to  be doing about the same in regard to his legs he may have just a slight amount of improvement with regard to the drainage probably Keystone topical antibiotics are helping in this regard to some degree. Fortunately there does not appear to be any signs of active infection systemically which is great news. No fevers, chills, nausea, vomiting, or diarrhea. 07-16-2022 upon evaluation today patient appears to be doing well currently in regard to his wound. He  has been tolerating the dressing changes without complication. Fortunately there does not appear to be any signs of active infection locally nor systemically at this time. With that being said he cannot keep the wraps up he tells me on the left side he had to cut this down because it got too tight. He has been using his pumps but he is on the right side the wrap actually straight down causing some pushing around the central part of his leg just below the calf I think this is a bigger risk for him that help at this point. I think that we may need to try something different he should be getting his compression socks shortly he tells me they were ordered last Thursday. 3/6; ; this is a patient who lives in [place]. He has severe bilateral lymphedema. He has compression pumps, we have been using kerlix Ace wrap Keystone. He is changing the dressing. We do not have home health. Objective Constitutional Patient is hypertensive.. Pulse regular and within target range for patient.Marland Kitchen Respirations regular, non-labored and within target range.. Temperature is normal and within the target range for the patient.Marland Kitchen Appears in no distress. Vitals Time Taken: 1:43 PM, Height: 74 in, Weight: 250 lbs, BMI: 32.1, Temperature: 97.9 F, Pulse: 94 bpm, Respiratory Rate: 18 breaths/min, Blood Pressure: 181/78 mmHg. General Notes: Wound exam; massive bilateral lymphedema. Wound is worse on the right than the left. On the right there is a large slough covered wound anteriorly and a small area on the left medial. There is no evidence of concurrent infection. As mentioned he is on [place]. Integumentary (Hair, Skin) Wound #6 status is Open. Original cause of wound was Gradually Appeared. The date acquired was: 03/05/2022. The wound has been in treatment 17 weeks. The wound is located on the Right,Medial Lower Leg. The wound measures 12cm length x 16cm width x 0.1cm depth; 150.796cm^2 area and 15.08cm^3 volume. There is Fat  Layer (Subcutaneous Tissue) exposed. There is no tunneling or undermining noted. There is a large amount of serosanguineous drainage noted. The wound margin is distinct with the outline attached to the wound base. There is small (1-33%) pink granulation within the wound bed. There is a large (67- 100%) amount of necrotic tissue within the wound bed including Adherent Slough. The periwound skin appearance exhibited: Maceration. The periwound skin appearance did not exhibit: Callus, Crepitus, Excoriation, Induration, Rash, Scarring, Dry/Scaly, Atrophie Blanche, Cyanosis, Ecchymosis, Hemosiderin Staining, Mottled, Pallor, Rubor, Erythema. Wound #7 status is Open. Original cause of wound was Gradually Appeared. The date acquired was: 03/05/2022. The wound has been in treatment 17 weeks. The wound is located on the Left,Circumferential Lower Leg. The wound measures 12cm length x 11cm width x 0.1cm depth; 103.673cm^2 area and 10.367cm^3 volume. There is Fat Layer (Subcutaneous Tissue) exposed. There is no tunneling or undermining noted. There is a medium amount of serosanguineous drainage noted. The wound margin is distinct with the outline attached to the wound base. There is large (67-100%) pink, pale granulation within the wound bed. There is a small (  1-33%) amount of necrotic tissue within the wound bed including Adherent Slough. The periwound skin appearance exhibited: Hemosiderin Staining. The periwound skin appearance did not exhibit: Callus, Crepitus, Excoriation, Induration, Rash, Scarring, Dry/Scaly, Maceration, Atrophie Blanche, Cyanosis, Ecchymosis, Mottled, Pallor, Rubor, Erythema. Assessment Active Problems ICD-10 Chronic venous hypertension (idiopathic) with ulcer and inflammation of bilateral lower extremity Lymphedema, not elsewhere classified Non-pressure chronic ulcer of other part of left lower leg with other specified severity Non-pressure chronic ulcer of other part of right lower  leg with other specified severity Procedures Wound #6 Pre-procedure diagnosis of Wound #6 is a Lymphedema located on the Right,Medial Lower Leg . There was a Excisional Skin/Subcutaneous Tissue Debridement with a total area of 192 sq cm performed by Ricard Dillon., MD. With the following instrument(s): Curette to remove Viable and Non-Viable tissue/material. Material removed includes Subcutaneous Tissue, Slough, Skin: Dermis, and Skin: Epidermis. A time out was conducted at 14:25, prior to the [place], [facility] ([id number]) [phone].pdf Page 9 of 10 start of the procedure. A Minimum amount of bleeding was controlled with Pressure. The procedure was tolerated well with a pain level of 0 throughout and a pain level of 0 following the procedure. Post Debridement Measurements: 12cm length x 16cm width x 0.1cm depth; 15.08cm^3 volume. Character of Wound/Ulcer Post Debridement is improved. Post procedure Diagnosis Wound #6: Same as Pre-Procedure Wound #7 Pre-procedure diagnosis of Wound #7 is a Lymphedema located on the Left,Circumferential Lower Leg . There was a Excisional Skin/Subcutaneous Tissue Debridement with a total area of 9 sq cm performed by Ricard Dillon., MD. With the following instrument(s): Curette to remove Viable and Non-Viable tissue/material. Material removed includes Subcutaneous Tissue, Slough, Skin: Dermis, and Skin: Epidermis. A time out was conducted at 14:25, prior to the start of the procedure. A Minimum amount of bleeding was controlled with Pressure. The procedure was tolerated well with a pain level of 0 throughout and a pain level of 0 following the procedure. Post Debridement Measurements: 12cm length x 11cm width x 0.1cm depth; 10.367cm^3 volume. Character of Wound/Ulcer Post Debridement is improved. Post procedure Diagnosis Wound #7: Same as Pre-Procedure Plan Follow-up Appointments: Return Appointment in 2 weeks. Margarita Grizzle Wednesday 1245  08/06/2022 overflow room 7 Nurse Visit: - [phone] 07/30/2022 for dressing change. Other: - ***Change daily. ***** Anesthetic: (In clinic) Topical Lidocaine 5% applied to wound bed Bathing/ Shower/ Hygiene: May shower with protection but do not get wound dressing(s) wet. Protect dressing(s) with water repellant cover (for example, large plastic bag) or a cast cover and may then take shower. Edema Control - Lymphedema / SCD / Other: Lymphedema Pumps. Use Lymphedema pumps on leg(s) 2-3 times a day for 45-60 minutes. If wearing any wraps or hose, do not remove them. Continue exercising as instructed. - *****pump 3 times a day for an hour each time.***** Elevate legs to the level of the heart or above for 30 minutes daily and/or when sitting for 3-4 times a day throughout the day. - important Avoid standing for long periods of time. Exercise regularly Compression stocking or Garment 30-40 mm/Hg pressure to: - juxtafit Essentials Long for bilateral lower legs; quantity three juxtafit essentials per limb. WOUND #6: - Lower Leg Wound Laterality: Right, Medial Cleanser: Soap and Water 1 x Per Day/30 Days Discharge Instructions: May shower and wash wound with dial antibacterial soap and water prior to dressing change. Peri-Wound Care: Sween Lotion (Moisturizing lotion) 1 x Per Day/30 Days Discharge Instructions: Apply moisturizing lotion as directed Prim Dressing: Maxorb Extra  Ag+ Alginate Dressing, 4x4.75 (in/in) 1 x Per Day/30 Days ary Discharge Instructions: Apply to wound bed as instructed Prim Dressing: Compounding topical antibiotics- [place] 1 x Per Day/30 Days ary Discharge Instructions: apply under alignate Ag when patient brings with appts. Secondary Dressing: ABD Pad, 5x9 1 x Per Day/30 Days Discharge Instructions: Apply over primary dressing as directed. Secondary Dressing: Woven Gauze Sponge, Non-Sterile 4x4 in 1 x Per Day/30 Days Discharge Instructions: Apply over primary  dressing as directed. Secured With: [employer], 4.5x3.1 (in/yd) 1 x Per Day/30 Days Discharge Instructions: Secure with Kerlix as directed. Secured With: 547M Medipore H Soft Cloth Surgical T ape, 4 x 10 (in/yd) 1 x Per Day/30 Days Discharge Instructions: Secure with tape as directed. Secured With: ace wrap 1 x Per Day/30 Days Discharge Instructions: apply to legs until compression garments arrive WOUND #7: - Lower Leg Wound Laterality: Left, Circumferential Cleanser: Soap and Water 1 x Per Day/30 Days Discharge Instructions: May shower and wash wound with dial antibacterial soap and water prior to dressing change. Peri-Wound Care: Sween Lotion (Moisturizing lotion) 1 x Per Day/30 Days Discharge Instructions: Apply moisturizing lotion as directed Prim Dressing: Maxorb Extra Ag+ Alginate Dressing, 4x4.75 (in/in) 1 x Per Day/30 Days ary Discharge Instructions: Apply to wound bed as instructed Prim Dressing: Compounding topical antibiotics- [place] 1 x Per Day/30 Days ary Discharge Instructions: apply under alignate Ag when patient brings with appts. Secondary Dressing: ABD Pad, 5x9 1 x Per Day/30 Days Discharge Instructions: Apply over primary dressing as directed. Secondary Dressing: Woven Gauze Sponge, Non-Sterile 4x4 in 1 x Per Day/30 Days Discharge Instructions: Apply over primary dressing as directed. Secured With: [employer], 4.5x3.1 (in/yd) 1 x Per Day/30 Days Discharge Instructions: Secure with Kerlix as directed. Secured With: 547M Medipore H Soft Cloth Surgical T ape, 4 x 10 (in/yd) 1 x Per Day/30 Days Discharge Instructions: Secure with tape as directed. Com pression Wrap: Ace wrap 1 x Per Day/30 Days Discharge Instructions: apply to legs until compression garments arrive. 1. Massive stage III lymphedema. 2. I reviewed his medication list he is not on any diuretics 3. I explained to him that he needs to use his external compression pumps 3 times a day  for 1 hour, keep his legs elevated for all time where he is not up walking. 3. I think he needs diuretics which would make some difference. 4. It would be nice to have home health but I am not sure we will be able to get him. 5 we are going to order juxta lite essentials, inelastic long, from [facility] Electronic Signature(s) Wentz, [name] ([id number]) [phone].pdf Page 10 of 10 Signed: 07/24/2022 4:49:59 PM By: [name], [name] Signed: 07/25/2022 2:38:35 AM By: Linton Ham MD Previous Signature: 07/23/2022 4:27:04 PM Version By: Linton Ham MD Entered By: Deon Pilling on 07/24/2022 16:47:36 -------------------------------------------------------------------------------- SuperBill Details Patient Name: Date of Service: [name], [name] 07/23/2022 Medical Record Number: [id number] Patient Account Number: [id number] Date of Birth/Sex: Treating RN: 1946/01/01 (77 y.o. 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 other part of right lower leg with other specified L97.828 Non-pressure chronic ulcer of other part of left lower leg with other specified  s Modifier: severity everity Quantity: 10 Physician Procedures : CPT4 Code Description Modifier [id number] 11042 - WC PHYS SUBQ TISS 20 SQ CM ICD-10 Diagnosis Description I87.333 Chronic venous hypertension (idiopathic) with ulcer and inflammation of bilateral lower extremity I89.0 Lymphedema, not elsewhere classified  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

## 2022-07-26 NOTE — Progress Notes (Signed)
[name], [name] ([id number].pdf Page 1 of 8 Visit Report for 07/23/2022 Arrival Information Details Patient Name: Date of Service: [name] 07/23/2022 1:30 PM Medical Record Number: [id number] Patient Account Number: [id number] Date of Birth/Sex: Treating RN: 1945-08-07 (77 y.o. M) Primary Care Wandy Bossler: [name] Other Clinician: Referring Normagene Harvie: Treating Destina Mantei/Extender: Alycia Rossetti in Treatment: 17 Visit Information History Since Last Visit Added or deleted any medications: No Patient Arrived: Cane Any new allergies or adverse reactions: No Arrival Time: 13:33 Had a fall or experienced change in No Accompanied By: wife activities of daily living that may affect Transfer Assistance: None risk of falls: Patient Identification Verified: Yes Signs or symptoms of abuse/neglect since last visito No Secondary Verification Process Completed: Yes Hospitalized since last visit: No Patient Requires Transmission-Based Precautions: No Implantable device outside of the clinic excluding No Patient Has Alerts: Yes cellular tissue based products placed in the center Patient Alerts: Patient on Blood Thinner since last visit: Right ABI in clinic [place] Has Dressing in Place as Prescribed: Yes Pain Present Now: No Electronic Signature(s) Signed: 07/24/2022 3:53:55 PM By: Erenest Blank Entered By: Erenest Blank on 07/23/2022 13:42:54 -------------------------------------------------------------------------------- Encounter Discharge Information Details Patient Name: Date of Service: [name] 07/23/2022 1:30 PM Medical Record Number: [id number] Patient Account Number: [id number] Date of Birth/Sex: Treating RN: April 11, 1946 (77 y.o. [name] Primary Care Elenie Coven: [name] Other Clinician: Referring Kimila Papaleo: Treating Goebel Hellums/Extender: Drucilla Schmidt Weeks in Treatment: 17 Encounter  Discharge Information Items Post Procedure Vitals Discharge Condition: Stable Temperature (F): 97.9 Ambulatory Status: Cane Pulse (bpm): 94 Discharge Destination: Home Respiratory Rate (breaths/min): 20 Transportation: Private Auto Blood Pressure (mmHg): 181/78 Accompanied By: wife Schedule Follow-up Appointment: Yes Clinical Summary of Care: Electronic Signature(s) Signed: 07/23/2022 5:05:26 PM By: [name], [name] Entered By: Deon Pilling on 07/23/2022 16:10:17 -------------------------------------------------------------------------------- Lower Extremity Assessment Details Patient Name: Date of Service: [name] 07/23/2022 1:30 PM Medical Record Number: [id number] Patient Account Number: [id number] Date of Birth/Sex: Treating RN: 06-17-1945 (77 y.o. M) Primary Care Lavanna Rog: [name] Other Clinician: Referring Bunny Lowdermilk: Treating Graysen Woodyard/Extender: Salvadore Oxford, [name] Weeks in Treatment: 17 Edema Assessment Assessed: [name]: Yes] [name]: Yes] J[Left: [name], [name] [id number] Patrice [id number].pdf Page 2 of 8] Edema: [Left: Yes] [Right: Yes] Calf Left: Right: Point of Measurement: 40 cm From Medial Instep 57 cm 64.4 cm Ankle Left: Right: Point of Measurement: 13 cm From Medial Instep 47.2 cm 43 cm Electronic Signature(s) Signed: 07/24/2022 3:53:55 PM By: Erenest Blank Entered By: Erenest Blank on 07/23/2022 14:03:06 -------------------------------------------------------------------------------- Multi Wound Chart Details Patient Name: Date of Service: [name] 07/23/2022 1:30 PM Medical Record Number: [id number] Patient Account Number: [id number] Date of Birth/Sex: Treating RN: [birth year] (77 y.o. M) Primary Care Garmon Dehn: [name] Other Clinician: Referring Versie Soave: Treating Saahil Herbster/Extender: Drucilla Schmidt Weeks in Treatment: 17 Vital Signs Height(in): 74 Pulse(bpm):  55 Weight(lbs): 250 Blood Pressure(mmHg): 181/78 Body Mass Index(BMI): 32.1 Temperature(F): 97.9 Respiratory Rate(breaths/min): 18 [6:Photos:] [N/A:N/A] Right, Medial Lower Leg Left, Circumferential Lower Leg N/A Wound Location: Gradually Appeared Gradually Appeared N/A Wounding Event: Lymphedema Lymphedema N/A Primary Etiology: Anemia, Lymphedema, Deep Vein Anemia, Lymphedema, Deep Vein N/A Comorbid History: Thrombosis, Hypertension, Received Thrombosis, Hypertension, Received Radiation Radiation 03/05/2022 03/05/2022 N/A Date Acquired: 17 17 N/A Weeks of Treatment: Open Open N/A Wound Status: No No N/A Wound Recurrence: Yes Yes N/A Clustered Wound: 2 2 N/A Clustered Quantity: 12x16x0.1 12x11x0.1 N/A Measurements L x W  x D (cm) 150.796 103.673 N/A A (cm) : rea 15.08 10.367 N/A Volume (cm) : -5.50% -120.00% N/A % Reduction in Area: -5.50% -120.00% N/A % Reduction in Volume: Full Thickness Without Exposed Full Thickness Without Exposed N/A Classification: Support Structures Support Structures Large Medium N/A Exudate Amount: Serosanguineous Serosanguineous N/A Exudate Type: red, brown red, brown N/A Exudate Color: Distinct, outline attached Distinct, outline attached N/A Wound Margin: Small (1-33%) Large (67-100%) N/A Granulation Amount: Pink Pink, Pale N/A Granulation Quality: Large (67-100%) Small (1-33%) N/A Necrotic Amount: Fat Layer (Subcutaneous Tissue): Yes Fat Layer (Subcutaneous Tissue): Yes N/A Exposed Structures: Fascia: No Fascia: No Tendon: No Tendon: No Muscle: No Muscle: No Joint: No Joint: No Bone: No Bone: No Small (1-33%) Medium (34-66%) N/A Epithelialization: Frieling, [place] ([id number].pdf Page 3 of 8 Debridement - Excisional Debridement - Excisional N/A Debridement: 14:25 14:25 N/A Pre-procedure Verification/Time Out Taken: Subcutaneous, Slough Subcutaneous, Slough N/A Tissue  Debrided: Skin/Subcutaneous Tissue Skin/Subcutaneous Tissue N/A Level: 192 9 N/A Debridement A (sq cm): rea Curette Curette N/A Instrument: Minimum Minimum N/A Bleeding: Pressure Pressure N/A Hemostasis A chieved: 0 0 N/A Procedural Pain: 0 0 N/A Post Procedural Pain: Procedure was tolerated well Procedure was tolerated well N/A Debridement Treatment Response: 12x16x0.1 12x11x0.1 N/A Post Debridement Measurements L x W x D (cm) 15.08 10.367 N/A Post Debridement Volume: (cm) Excoriation: No Excoriation: No N/A Periwound Skin Texture: Induration: No Induration: No Callus: No Callus: No Crepitus: No Crepitus: No Rash: No Rash: No Scarring: No Scarring: No Maceration: Yes Maceration: No N/A Periwound Skin Moisture: Dry/Scaly: No Dry/Scaly: No Atrophie Blanche: No Hemosiderin Staining: Yes N/A Periwound Skin Color: Cyanosis: No Atrophie Blanche: No Ecchymosis: No Cyanosis: No Erythema: No Ecchymosis: No Hemosiderin Staining: No Erythema: No Mottled: No Mottled: No Pallor: No Pallor: No Rubor: No Rubor: No Debridement Debridement N/A Procedures Performed: Treatment Notes Electronic Signature(s) Signed: 07/23/2022 4:27:04 PM By: Linton Ham MD Entered By: Linton Ham on 07/23/2022 14:39:45 -------------------------------------------------------------------------------- Multi-Disciplinary Care Plan Details Patient Name: Date of Service: Bartosiewicz, DA [name] 07/23/2022 1:30 PM Medical Record Number: [id number] Patient Account Number: [id number] Date of Birth/Sex: Treating RN: 1946/01/26 (78 y.o. [name] Primary Care Paulla Mcclaskey: [name] Other Clinician: Referring Brandonn Capelli: Treating Kwamaine Cuppett/Extender: Drucilla Schmidt Weeks in Treatment: 17 Active Inactive Wound/Skin Impairment Nursing Diagnoses: Impaired tissue integrity Knowledge deficit related to ulceration/compromised skin integrity Goals: Patient will have a  decrease in wound volume by X% from date: (specify in notes) Date Initiated: 03/26/2022 Target Resolution Date: 08/15/2022 Goal Status: Active Patient/caregiver will verbalize understanding of skin care regimen Date Initiated: 03/26/2022 Target Resolution Date: 08/15/2022 Goal Status: Active Ulcer/skin breakdown will have a volume reduction of 30% by week 4 Date Initiated: 03/26/2022 Date Inactivated: 05/21/2022 Target Resolution Date: 05/17/2022 Unmet Reason: see wound Goal Status: Unmet measurement. Ulcer/skin breakdown will have a volume reduction of 50% by week 8 Date Initiated: 03/26/2022 Date Inactivated: 05/21/2022 Target Resolution Date: 05/17/2022 Unmet Reason: see wound Goal Status: Unmet measurement. Interventions: Assess patient/caregiver ability to obtain necessary supplies [place], [facility] ([id number].pdf Page 4 of 8 Assess patient/caregiver ability to perform ulcer/skin care regimen upon admission and as needed Assess ulceration(s) every visit Notes: Patient stated today, "I will take my fluid pill or pump not do both." Karon Heckendorn made aware. Electronic Signature(s) Signed: 07/23/2022 5:05:26 PM By: [name], [name] Entered By: Deon Pilling on 07/23/2022 14:16:35 -------------------------------------------------------------------------------- Pain Assessment Details Patient Name: Date of Service: Jaime, DA [name] 07/23/2022 1:30 PM Medical Record Number: [id number] Patient  Account Number: [id number] Date of Birth/Sex: Treating RN: [birth year] (77 y.o. M) Primary Care Kegan Shepardson: [name] Other Clinician: Referring Ashantia Amaral: Treating Ardit Danh/Extender: Drucilla Schmidt Weeks in Treatment: 17 Active Problems Location of Pain Severity and Description of Pain Patient Has Paino No Site Locations Pain Management and Medication Current Pain Management: Electronic Signature(s) Signed: 07/24/2022 3:53:55 PM By: Erenest Blank Entered By: Erenest Blank on 07/23/2022 13:43:47 -------------------------------------------------------------------------------- Patient/Caregiver Education Details Patient Name: Date of Service: Cuccaro, DA [name] 3/6/2024andnbsp1:30 PM Medical Record Number: [id number] Patient Account Number: [id number] Date of Birth/Gender: Treating RN: 24-Apr-1946 (77 y.o. [name] Primary Care Physician: [name] Other Clinician: Referring Physician: Treating Physician/Extender: Alycia Rossetti in Treatment: 17 Education Assessment Education Provided To: Patient Education Topics Provided Venous: [place], [name] ([id number]) [id number] Page 5 of 8 Handouts: Controlling Swelling with Compression Stockings Methods: Explain/Verbal Responses: Reinforcements needed Electronic Signature(s) Signed: 07/23/2022 5:05:26 PM By: [name], [name] Entered By: Deon Pilling on 07/23/2022 14:16:51 -------------------------------------------------------------------------------- Wound Assessment Details Patient Name: Date of Service: Wiebelhaus, DA [name] 07/23/2022 1:30 PM Medical Record Number: [id number] Patient Account Number: [id number] Date of Birth/Sex: Treating RN: Feb 04, 1946 (77 y.o. M) Primary Care Randal Yepiz: [name] Other Clinician: Referring Genelda Roark: Treating Tahirah Sara/Extender: Drucilla Schmidt Weeks in Treatment: 17 Wound Status Wound Number: 6 Primary Lymphedema Etiology: Wound Location: Right, Medial Lower Leg Wound Open Wounding Event: Gradually Appeared Status: Date Acquired: 03/05/2022 Comorbid Anemia, Lymphedema, Deep Vein Thrombosis, Hypertension, Weeks Of Treatment: 17 History: Received Radiation Clustered Wound: Yes Photos Wound Measurements Length: (cm) 12 Width: (cm) 16 Depth: (cm) 0.1 Clustered Quantity: 2 Area: (cm) 150.796 Volume: (cm) 15.08 % Reduction in Area: -5.5% %  Reduction in Volume: -5.5% Epithelialization: Small (1-33%) Tunneling: No Undermining: No Wound Description Classification: Full Thickness Without Exposed Sup Wound Margin: Distinct, outline attached Exudate Amount: Large Exudate Type: Serosanguineous Exudate Color: red, brown port Structures Foul Odor After Cleansing: No Slough/Fibrino Yes Wound Bed Granulation Amount: Small (1-33%) Exposed Structure Granulation Quality: Pink Fascia Exposed: No Necrotic Amount: Large (67-100%) Fat Layer (Subcutaneous Tissue) Exposed: Yes Necrotic Quality: Adherent Slough Tendon Exposed: No Muscle Exposed: No Joint Exposed: No Bone Exposed: No Periwound Skin Texture Texture Color No Abnormalities Noted: No No Abnormalities Noted: No Callus: No Atrophie Blanche: No Crepitus: No Cyanosis: No Excoriation: No Ecchymosis: No Thrall, Eryk ([id number] Page 6 of 8 Induration: No Erythema: No Rash: No Hemosiderin Staining: No Scarring: No Mottled: No Pallor: No Moisture Rubor: No No Abnormalities Noted: No Dry / Scaly: No Maceration: Yes Treatment Notes Wound #6 (Lower Leg) Wound Laterality: Right, Medial Cleanser Soap and Water Discharge Instruction: May shower and wash wound with dial antibacterial soap and water prior to dressing change. Peri-Wound Care Sween Lotion (Moisturizing lotion) Discharge Instruction: Apply moisturizing lotion as directed Topical Primary Dressing Maxorb Extra Ag+ Alginate Dressing, 4x4.75 (in/in) Discharge Instruction: Apply to wound bed as instructed Compounding topical antibiotics- [facility] Discharge Instruction: apply under alignate Ag when patient brings with appts. Secondary Dressing ABD Pad, 5x9 Discharge Instruction: Apply over primary dressing as directed. Woven Gauze Sponge, Non-Sterile 4x4 in Discharge Instruction: Apply over primary dressing as directed. Secured With [employer],  4.5x3.1 (in/yd) Discharge Instruction: Secure with Kerlix as directed. 4M Medipore H Soft Cloth Surgical T ape, 4 x 10 (in/yd) Discharge Instruction: Secure with tape as directed. ace wrap Discharge Instruction: apply to legs until compression garments arrive Compression Wrap Compression Stockings Add-Ons Electronic Signature(s) Signed: 07/24/2022  3:53:55 PM By: Erenest Blank Entered By: Erenest Blank on 07/23/2022 14:05:59 -------------------------------------------------------------------------------- Wound Assessment Details Patient Name: Date of Service: Mcbee, DA [name] 07/23/2022 1:30 PM Medical Record Number: [id number] Patient Account Number: [id number] Date of Birth/Sex: Treating RN: Dec 27, 1945 (77 y.o. M) Primary Care Jaritza Duignan: [name] Other Clinician: Referring Shaye Elling: Treating Caty Tessler/Extender: Drucilla Schmidt Weeks in Treatment: 17 Wound Status Wound Number: 7 Primary Lymphedema Etiology: Wound Location: Left, Circumferential Lower Leg Wound Open Wounding Event: Gradually Appeared Status: Date Acquired: 03/05/2022 Comorbid Anemia, Lymphedema, Deep Vein Thrombosis, Hypertension, Weeks Of Treatment: 17 History: Received Radiation Clustered Wound: Yes Photos Manske, [name] ([id number] Page 7 of 8 Wound Measurements Length: (cm) Width: (cm) Depth: (cm) Clustered Quantity: Area: (cm) Volume: (cm) 12 % Reduction in Area: -120% 11 % Reduction in Volume: -120% 0.1 Epithelialization: Medium (34-66%) 2 Tunneling: No 103.673 Undermining: No 10.367 Wound Description Classification: Full Thickness Without Exposed Sup Wound Margin: Distinct, outline attached Exudate Amount: Medium Exudate Type: Serosanguineous Exudate Color: red, brown port Structures Foul Odor After Cleansing: No Slough/Fibrino Yes Wound Bed Granulation Amount: Large (67-100%) Exposed Structure Granulation Quality: Pink,  Pale Fascia Exposed: No Necrotic Amount: Small (1-33%) Fat Layer (Subcutaneous Tissue) Exposed: Yes Necrotic Quality: Adherent Slough Tendon Exposed: No Muscle Exposed: No Joint Exposed: No Bone Exposed: No Periwound Skin Texture Texture Color No Abnormalities Noted: No No Abnormalities Noted: No Callus: No Atrophie Blanche: No Crepitus: No Cyanosis: No Excoriation: No Ecchymosis: No Induration: No Erythema: No Rash: No Hemosiderin Staining: Yes Scarring: No Mottled: No Pallor: No Moisture Rubor: No No Abnormalities Noted: No Dry / Scaly: No Maceration: No Treatment Notes Wound #7 (Lower Leg) Wound Laterality: Left, Circumferential Cleanser Soap and Water Discharge Instruction: May shower and wash wound with dial antibacterial soap and water prior to dressing change. Peri-Wound Care Sween Lotion (Moisturizing lotion) Discharge Instruction: Apply moisturizing lotion as directed Topical Primary Dressing Maxorb Extra Ag+ Alginate Dressing, 4x4.75 (in/in) Discharge Instruction: Apply to wound bed as instructed Compounding topical antibiotics- [facility] Discharge Instruction: apply under alignate Ag when patient brings with appts. Secondary Dressing ABD Pad, 5x9 Discharge Instruction: Apply over primary dressing as directed. Belitz, [name] ([id number] Page 8 of 8 Woven Gauze Sponge, Non-Sterile 4x4 in Discharge Instruction: Apply over primary dressing as directed. Secured With [employer], 4.5x3.1 (in/yd) Discharge Instruction: Secure with Kerlix as directed. 39M Medipore H Soft Cloth Surgical T ape, 4 x 10 (in/yd) Discharge Instruction: Secure with tape as directed. Compression Wrap Ace wrap Discharge Instruction: apply to legs until compression garments arrive. Compression Stockings Add-Ons Electronic Signature(s) Signed: 07/24/2022 3:53:55 PM By: Erenest Blank Entered By: Erenest Blank on 07/23/2022  14:05:34 -------------------------------------------------------------------------------- Vitals Details Patient Name: Date of Service: Cudney, DA [name] 07/23/2022 1:30 PM Medical Record Number: [id number] Patient Account Number: [id number] Date of Birth/Sex: Treating RN: 08-30-1945 (77 y.o. M) Primary Care Naevia Unterreiner: [name] Other Clinician: Referring Stormie Ventola: Treating Alyn Riedinger/Extender: Drucilla Schmidt Weeks in Treatment: 17 Vital Signs Time Taken: 13:43 Temperature (F): 97.9 Height (in): 74 Pulse (bpm): 94 Weight (lbs): 250 Respiratory Rate (breaths/min): 18 Body Mass Index (BMI): 32.1 Blood Pressure (mmHg): 181/78 Reference Range: 80 - 120 mg / dl Electronic Signature(s) Signed: 07/24/2022 3:53:55 PM By: Erenest Blank Entered By: Erenest Blank on 07/23/2022 13:54:11

## 2022-07-30 ENCOUNTER — Encounter (HOSPITAL_BASED_OUTPATIENT_CLINIC_OR_DEPARTMENT_OTHER): Payer: Medicare Other | Admitting: Internal Medicine

## 2022-07-30 DIAGNOSIS — I87333 Chronic venous hypertension (idiopathic) with ulcer and inflammation of bilateral lower extremity: Secondary | ICD-10-CM | POA: Diagnosis not present

## 2022-07-31 NOTE — Progress Notes (Signed)
Mittleman, [name] ([id number].pdf Page 1 of 5 Visit Report for 07/30/2022 Arrival Information Details Patient Name: Date of Service: [name], [name] 07/30/2022 12:30 PM Medical Record Number: [id number] Patient Account Number: [id number] Date of Birth/Sex: Treating RN: 10-28-1945 (77 y.o. [name] Primary Care Katira Dumais: [name] Other Clinician: Referring [name]: Treating [name]/Extender: [name] Weeks in Treatment: 40 Visit Information History Since Last Visit Added or deleted any medications: No Patient Arrived: [name] Any new allergies or adverse reactions: No Arrival Time: 12:35 Had a fall or experienced change in No Accompanied By: wife activities of daily living that may affect Transfer Assistance: None risk of falls: Patient Identification Verified: Yes Signs or symptoms of abuse/neglect since last visito No Secondary Verification Process Completed: Yes Hospitalized since last visit: No Patient Requires Transmission-Based Precautions: No Implantable device outside of the clinic excluding No Patient Has Alerts: Yes cellular tissue based products placed in the center Patient Alerts: Patient on Blood Thinner since last visit: Right ABI in clinic [name] Has Dressing in Place as Prescribed: Yes Pain Present Now: No Notes per patient received his compression garments. Electronic Signature(s) Signed: 07/30/2022 5:26:11 PM By: [name], [name] Entered By: Deon Pilling on 07/30/2022 12:35:37 -------------------------------------------------------------------------------- Clinic Level of Care Assessment Details Patient Name: Date of Service: [name], [name] 07/30/2022 12:30 PM Medical Record Number: [id number] Patient Account Number: [id number] Date of Birth/Sex: Treating RN: 04/06/1946 (77 y.o. [name], [name] Primary Care [name]: [name] Other Clinician: Referring [name]: Treating  [name]/Extender: [name] Weeks in Treatment: 18 Clinic Level of Care Assessment Items TOOL 4 Quantity Score X- 1 0 Use when only an EandM is performed on FOLLOW-UP visit ASSESSMENTS - Nursing Assessment / Reassessment X- 1 10 Reassessment of Co-morbidities (includes updates in patient status) X- 1 5 Reassessment of Adherence to Treatment Plan ASSESSMENTS - Wound and Skin A ssessment / Reassessment '[]'$  - 0 Simple Wound Assessment / Reassessment - one wound X- 2 5 Complex Wound Assessment / Reassessment - multiple wounds '[]'$  - 0 Dermatologic / Skin Assessment (not related to wound area) ASSESSMENTS - Focused Assessment '[]'$  - 0 Circumferential Edema Measurements - multi extremities '[]'$  - 0 Nutritional Assessment / Counseling / Intervention '[]'$  - 0 Lower Extremity Assessment (monofilament, tuning fork, pulses) '[]'$  - 0 Peripheral Arterial Disease Assessment (using hand held doppler) ASSESSMENTS - Ostomy and/or Continence Assessment and Care '[]'$  - 0 Incontinence Assessment and Management Hegna, [name] ([id number].pdf Page 2 of 5 '[]'$  - 0 Ostomy Care Assessment and Management (repouching, etc.) PROCESS - Coordination of Care '[]'$  - 0 Simple Patient / Family Education for ongoing care X- 1 20 Complex (extensive) Patient / Family Education for ongoing care X- 1 10 Staff obtains Programmer, systems, Records, T Results / Process Orders est '[]'$  - 0 Staff telephones HHA, Nursing Homes / Clarify orders / etc '[]'$  - 0 Routine Transfer to another Facility (non-emergent condition) '[]'$  - 0 Routine Hospital Admission (non-emergent condition) '[]'$  - 0 New Admissions / Biomedical engineer / Ordering NPWT Apligraf, etc. , '[]'$  - 0 Emergency Hospital Admission (emergent condition) '[]'$  - 0 Simple Discharge Coordination X- 1 15 Complex (extensive) Discharge Coordination PROCESS - Special Needs '[]'$  - 0 Pediatric / Minor Patient Management '[]'$  -  0 Isolation Patient Management '[]'$  - 0 Hearing / Language / Visual special needs '[]'$  - 0 Assessment of Community assistance (transportation, D/C planning, etc.) '[]'$  - 0 Additional assistance / Altered mentation '[]'$  - 0 Support Surface(s)  Assessment (bed, cushion, seat, etc.) INTERVENTIONS - Wound Cleansing / Measurement '[]'$  - 0 Simple Wound Cleansing - one wound X- 2 5 Complex Wound Cleansing - multiple wounds '[]'$  - 0 Wound Imaging (photographs - any number of wounds) '[]'$  - 0 Wound Tracing (instead of photographs) '[]'$  - 0 Simple Wound Measurement - one wound '[]'$  - 0 Complex Wound Measurement - multiple wounds INTERVENTIONS - Wound Dressings '[]'$  - 0 Small Wound Dressing one or multiple wounds X- 2 15 Medium Wound Dressing one or multiple wounds '[]'$  - 0 Large Wound Dressing one or multiple wounds '[]'$  - 0 Application of Medications - topical '[]'$  - 0 Application of Medications - injection INTERVENTIONS - Miscellaneous '[]'$  - 0 External ear exam '[]'$  - 0 Specimen Collection (cultures, biopsies, blood, body fluids, etc.) '[]'$  - 0 Specimen(s) / Culture(s) sent or taken to Lab for analysis '[]'$  - 0 Patient Transfer (multiple staff / Civil Service fast streamer / Similar devices) '[]'$  - 0 Simple Staple / Suture removal (25 or less) '[]'$  - 0 Complex Staple / Suture removal (26 or more) '[]'$  - 0 Hypo / Hyperglycemic Management (close monitor of Blood Glucose) '[]'$  - 0 Ankle / Brachial Index (ABI) - do not check if billed separately X- 1 5 Vital Signs Has the patient been seen at the hospital within the last three years: Yes Total Score: 115 Level Of Care: New/Established - Level 3 [name], [name] ([id number].pdf Page 3 of 5 Electronic Signature(s) Signed: 07/30/2022 5:26:11 PM By: [name], [name] Entered By: Deon Pilling on 07/30/2022 14:49:02 -------------------------------------------------------------------------------- Encounter Discharge Information Details Patient  Name: Date of Service: [name], [name] 07/30/2022 12:30 PM Medical Record Number: [id number] Patient Account Number: [id number] Date of Birth/Sex: Treating RN: 1946/05/12 (77 y.o. [name] Primary Care Jeniffer Culliver: [name] Other Clinician: Referring Sueellen Kayes: Treating Freeman Borba/Extender: [name] Weeks in Treatment: 25 Encounter Discharge Information Items Discharge Condition: Stable Ambulatory Status: Cane Discharge Destination: Home Transportation: Private Auto Accompanied By: wife Schedule Follow-up Appointment: Yes Clinical Summary of Care: Electronic Signature(s) Signed: 07/30/2022 5:26:11 PM By: [name], [name] Entered By: Deon Pilling on 07/30/2022 14:48:23 -------------------------------------------------------------------------------- Wound Assessment Details Patient Name: Date of Service: [name], [name] 07/30/2022 12:30 PM Medical Record Number: [id number] Patient Account Number: [id number] Date of Birth/Sex: Treating RN: [birth year] (76 y.o. [name], [name] Primary Care Channah Godeaux: [name] Other Clinician: Referring Chrishana Spargur: Treating Ashleynicole Mcclees/Extender: [name] Weeks in Treatment: 18 Wound Status Wound Number: 6 Primary Etiology: Lymphedema Wound Location: Right, Medial Lower Leg Wound Status: Open Wounding Event: Gradually Appeared Date Acquired: 03/05/2022 Weeks Of Treatment: 18 Clustered Wound: Yes Wound Measurements Length: (cm) 12 Width: (cm) 16 Depth: (cm) 0.1 Area: (cm) 150.796 Volume: (cm) 15.08 % Reduction in Area: -5.5% % Reduction in Volume: -5.5% Wound Description Classification: Full Thickness Without Exposed Support Exudate Amount: Large Exudate Type: Serosanguineous Exudate Color: red, brown Structures Periwound Skin Texture Texture Color No Abnormalities Noted: No No Abnormalities Noted: No Moisture No Abnormalities Noted: No Treatment Notes Wound #6  (Lower Leg) Wound Laterality: Right, Medial [name], [name] ([id number].pdf Page 4 of 5 Cleanser Soap and Water Discharge Instruction: May shower and wash wound with dial antibacterial soap and water prior to dressing change. Peri-Wound Care Sween Lotion (Moisturizing lotion) Discharge Instruction: Apply moisturizing lotion as directed Topical Primary Dressing Maxorb Extra Ag+ Alginate Dressing, 4x4.75 (in/in) Discharge Instruction: Apply to wound bed as instructed Compounding topical antibiotics- [facility] Discharge Instruction: apply under alignate Ag  when patient brings with appts. Secondary Dressing ABD Pad, 5x9 Discharge Instruction: Apply over primary dressing as directed. Woven Gauze Sponge, Non-Sterile 4x4 in Discharge Instruction: Apply over primary dressing as directed. Secured With [employer], 4.5x3.1 (in/yd) Discharge Instruction: Secure with Kerlix as directed. 9M Medipore H Soft Cloth Surgical T ape, 4 x 10 (in/yd) Discharge Instruction: Secure with tape as directed. Compression Wrap Compression Stockings Add-Ons Notes educated patient on juxtafit to right leg. Patient left other juxtafit at home. Instructed to apply juxtafite to left leg when he goes home. Electronic Signature(s) Signed: 07/30/2022 5:26:11 PM By: [name], [name] Entered By: Deon Pilling on 07/30/2022 12:35:47 -------------------------------------------------------------------------------- Wound Assessment Details Patient Name: Date of Service: Dado, [name] 07/30/2022 12:30 PM Medical Record Number: [id number] Patient Account Number: [id number] Date of Birth/Sex: Treating RN: [birth year] (77 y.o. [name] Primary Care Izan Miron: [name] Other Clinician: Referring Jarel Cuadra: Treating Nalina Yeatman/Extender: [name] Weeks in Treatment: 18 Wound Status Wound Number: 7 Primary Etiology: Lymphedema Wound  Location: Left, Circumferential Lower Leg Wound Status: Open Wounding Event: Gradually Appeared Date Acquired: 03/05/2022 Weeks Of Treatment: 18 Clustered Wound: Yes Wound Measurements Length: (cm) 12 Width: (cm) 11 Depth: (cm) 0.1 Area: (cm) 103.673 Volume: (cm) 10.367 % Reduction in Area: -120% % Reduction in Volume: -120% Wound Description Classification: Full Thickness Without Exposed Support Structures Exudate Amount: Medium Exudate Type: Serosanguineous Exudate Color: red, brown [name], [name] ([id number].pdf Page 5 of 5 Periwound Skin Texture Texture Color No Abnormalities Noted: No No Abnormalities Noted: No Moisture No Abnormalities Noted: No Treatment Notes Wound #7 (Lower Leg) Wound Laterality: Left, Circumferential Cleanser Soap and Water Discharge Instruction: May shower and wash wound with dial antibacterial soap and water prior to dressing change. Peri-Wound Care Sween Lotion (Moisturizing lotion) Discharge Instruction: Apply moisturizing lotion as directed Topical Primary Dressing Maxorb Extra Ag+ Alginate Dressing, 4x4.75 (in/in) Discharge Instruction: Apply to wound bed as instructed Compounding topical antibiotics- [facility] Discharge Instruction: apply under alignate Ag when patient brings with appts. Secondary Dressing ABD Pad, 5x9 Discharge Instruction: Apply over primary dressing as directed. Woven Gauze Sponge, Non-Sterile 4x4 in Discharge Instruction: Apply over primary dressing as directed. Secured With [employer], 4.5x3.1 (in/yd) Discharge Instruction: Secure with Kerlix as directed. 9M Medipore H Soft Cloth Surgical T ape, 4 x 10 (in/yd) Discharge Instruction: Secure with tape as directed. Compression Wrap Compression Stockings Add-Ons Notes educated patient on juxtafit to right leg. Patient left other juxtafit at home. Instructed to apply juxtafite to left leg when he goes  home. Electronic Signature(s) Signed: 07/30/2022 5:26:11 PM By: [name], [name] Entered By: Deon Pilling on 07/30/2022 12:35:47

## 2022-08-01 NOTE — Progress Notes (Signed)
Lippman, [name] ([id number].pdf Page 1 of 1 Visit Report for 07/30/2022 SuperBill Details Patient Name: Date of Service: [name], [name] 07/30/2022 Medical Record Number: [id number] Patient Account Number: [id number] Date of Birth/Sex: Treating RN: [birth year] (77 y.o. 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

## 2022-08-06 ENCOUNTER — Other Ambulatory Visit: Payer: Self-pay

## 2022-08-06 ENCOUNTER — Encounter (HOSPITAL_COMMUNITY): Payer: Self-pay

## 2022-08-06 ENCOUNTER — Emergency Department (HOSPITAL_COMMUNITY): Payer: Medicare Other

## 2022-08-06 ENCOUNTER — Encounter (HOSPITAL_BASED_OUTPATIENT_CLINIC_OR_DEPARTMENT_OTHER): Payer: Medicare Other | Admitting: Physician Assistant

## 2022-08-06 ENCOUNTER — Inpatient Hospital Stay (HOSPITAL_COMMUNITY)
Admission: EM | Admit: 2022-08-06 | Discharge: 2022-08-10 | DRG: 176 | Disposition: A | Discharge status: Home / Self Care | Payer: Medicare Other | Source: Ambulatory Visit | Attending: Internal Medicine | Admitting: Internal Medicine

## 2022-08-06 DIAGNOSIS — I071 Rheumatic tricuspid insufficiency: Secondary | ICD-10-CM | POA: Diagnosis present

## 2022-08-06 DIAGNOSIS — D6959 Other secondary thrombocytopenia: Secondary | ICD-10-CM | POA: Diagnosis present

## 2022-08-06 DIAGNOSIS — R0609 Other forms of dyspnea: Secondary | ICD-10-CM | POA: Diagnosis not present

## 2022-08-06 DIAGNOSIS — I130 Hypertensive heart and chronic kidney disease with heart failure and stage 1 through stage 4 chronic kidney disease, or unspecified chronic kidney disease: Secondary | ICD-10-CM | POA: Diagnosis not present

## 2022-08-06 DIAGNOSIS — I160 Hypertensive urgency: Secondary | ICD-10-CM | POA: Diagnosis present

## 2022-08-06 DIAGNOSIS — R600 Localized edema: Secondary | ICD-10-CM

## 2022-08-06 DIAGNOSIS — L97828 Non-pressure chronic ulcer of other part of left lower leg with other specified severity: Secondary | ICD-10-CM | POA: Diagnosis not present

## 2022-08-06 DIAGNOSIS — M545 Low back pain, unspecified: Secondary | ICD-10-CM | POA: Diagnosis present

## 2022-08-06 DIAGNOSIS — I2510 Atherosclerotic heart disease of native coronary artery without angina pectoris: Secondary | ICD-10-CM | POA: Diagnosis present

## 2022-08-06 DIAGNOSIS — Z8546 Personal history of malignant neoplasm of prostate: Secondary | ICD-10-CM

## 2022-08-06 DIAGNOSIS — M542 Cervicalgia: Secondary | ICD-10-CM | POA: Diagnosis present

## 2022-08-06 DIAGNOSIS — N1832 Chronic kidney disease, stage 3b: Secondary | ICD-10-CM | POA: Diagnosis not present

## 2022-08-06 DIAGNOSIS — I872 Venous insufficiency (chronic) (peripheral): Secondary | ICD-10-CM | POA: Diagnosis present

## 2022-08-06 DIAGNOSIS — D696 Thrombocytopenia, unspecified: Secondary | ICD-10-CM | POA: Diagnosis present

## 2022-08-06 DIAGNOSIS — I2699 Other pulmonary embolism without acute cor pulmonale: Secondary | ICD-10-CM | POA: Diagnosis not present

## 2022-08-06 DIAGNOSIS — D509 Iron deficiency anemia, unspecified: Secondary | ICD-10-CM | POA: Diagnosis present

## 2022-08-06 DIAGNOSIS — R791 Abnormal coagulation profile: Secondary | ICD-10-CM

## 2022-08-06 DIAGNOSIS — I10 Essential (primary) hypertension: Secondary | ICD-10-CM

## 2022-08-06 DIAGNOSIS — Z881 Allergy status to other antibiotic agents status: Secondary | ICD-10-CM

## 2022-08-06 DIAGNOSIS — Z888 Allergy status to other drugs, medicaments and biological substances status: Secondary | ICD-10-CM

## 2022-08-06 DIAGNOSIS — M1612 Unilateral primary osteoarthritis, left hip: Secondary | ICD-10-CM | POA: Diagnosis present

## 2022-08-06 DIAGNOSIS — I5032 Chronic diastolic (congestive) heart failure: Secondary | ICD-10-CM | POA: Diagnosis present

## 2022-08-06 DIAGNOSIS — I081 Rheumatic disorders of both mitral and tricuspid valves: Secondary | ICD-10-CM | POA: Diagnosis present

## 2022-08-06 DIAGNOSIS — I340 Nonrheumatic mitral (valve) insufficiency: Secondary | ICD-10-CM | POA: Diagnosis present

## 2022-08-06 DIAGNOSIS — Z79899 Other long term (current) drug therapy: Secondary | ICD-10-CM

## 2022-08-06 DIAGNOSIS — Z86718 Personal history of other venous thrombosis and embolism: Secondary | ICD-10-CM | POA: Diagnosis not present

## 2022-08-06 DIAGNOSIS — I87333 Chronic venous hypertension (idiopathic) with ulcer and inflammation of bilateral lower extremity: Secondary | ICD-10-CM | POA: Diagnosis not present

## 2022-08-06 DIAGNOSIS — Z6835 Body mass index (BMI) 35.0-35.9, adult: Secondary | ICD-10-CM

## 2022-08-06 DIAGNOSIS — I890 Lymphedema, not elsewhere classified: Secondary | ICD-10-CM | POA: Diagnosis not present

## 2022-08-06 DIAGNOSIS — E785 Hyperlipidemia, unspecified: Secondary | ICD-10-CM | POA: Diagnosis present

## 2022-08-06 DIAGNOSIS — L97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 other part of right lower leg with other specified severity: Secondary | ICD-10-CM | POA: Diagnosis not present

## 2022-08-06 DIAGNOSIS — I429 Cardiomyopathy, unspecified: Secondary | ICD-10-CM | POA: Diagnosis present

## 2022-08-06 DIAGNOSIS — Z7901 Long term (current) use of anticoagulants: Secondary | ICD-10-CM

## 2022-08-06 DIAGNOSIS — G8929 Other chronic pain: Secondary | ICD-10-CM | POA: Diagnosis present

## 2022-08-06 LAB — BASIC METABOLIC PANEL
Anion gap: 5 (ref 5–15)
BUN: 37 mg/dL — ABNORMAL HIGH (ref 8–23)
CO2: 22 mmol/L (ref 22–32)
Calcium: 8.8 mg/dL — ABNORMAL LOW (ref 8.9–10.3)
Chloride: 110 mmol/L (ref 98–111)
Creatinine, Ser: 1.56 mg/dL — ABNORMAL HIGH (ref 0.61–1.24)
GFR, Estimated: 45 mL/min — ABNORMAL LOW (ref >60)
Glucose, Bld: 102 mg/dL — ABNORMAL HIGH (ref 70–99)
Potassium: 4.5 mmol/L (ref 3.5–5.1)
Sodium: 137 mmol/L (ref 135–145)

## 2022-08-06 LAB — D-DIMER, QUANTITATIVE: D-Dimer, Quant: 1.15 ug/mL-FEU — ABNORMAL HIGH (ref 0.00–0.50)

## 2022-08-06 LAB — BRAIN NATRIURETIC PEPTIDE: B Natriuretic Peptide: 184.4 pg/mL — ABNORMAL HIGH (ref 0.0–100.0)

## 2022-08-06 MED ORDER — HYDRALAZINE HCL 25 MG PO TABS
50.0000 mg | ORAL_TABLET | Freq: Once | ORAL | Status: AC
  Administered 2022-08-06: 50 mg via ORAL
  Filled 2022-08-06: qty 2

## 2022-08-06 NOTE — ED Triage Notes (Signed)
Pt was sent over here from the wound center after noticing systolic bp was over [id number]. Pt c/o right hip pain and knee swelling as well.   Pt on warfarin

## 2022-08-06 NOTE — Discharge Instructions (Signed)
Please return in the morning to [facility] for an ultrasound of your right leg.
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
## 2022-08-06 NOTE — ED Notes (Signed)
Blue top sent down. 

## 2022-08-07 ENCOUNTER — Encounter (HOSPITAL_COMMUNITY): Payer: Self-pay

## 2022-08-07 ENCOUNTER — Inpatient Hospital Stay (HOSPITAL_COMMUNITY): Payer: Medicare Other

## 2022-08-07 ENCOUNTER — Other Ambulatory Visit (HOSPITAL_COMMUNITY): Payer: Self-pay

## 2022-08-07 ENCOUNTER — Emergency Department (HOSPITAL_COMMUNITY): Payer: Medicare Other

## 2022-08-07 DIAGNOSIS — G8929 Other chronic pain: Secondary | ICD-10-CM | POA: Diagnosis present

## 2022-08-07 DIAGNOSIS — D6959 Other secondary thrombocytopenia: Secondary | ICD-10-CM | POA: Diagnosis present

## 2022-08-07 DIAGNOSIS — I081 Rheumatic disorders of both mitral and tricuspid valves: Secondary | ICD-10-CM | POA: Diagnosis present

## 2022-08-07 DIAGNOSIS — I2699 Other pulmonary embolism without acute cor pulmonale: Secondary | ICD-10-CM | POA: Diagnosis present

## 2022-08-07 DIAGNOSIS — D509 Iron deficiency anemia, unspecified: Secondary | ICD-10-CM | POA: Diagnosis present

## 2022-08-07 DIAGNOSIS — I2510 Atherosclerotic heart disease of native coronary artery without angina pectoris: Secondary | ICD-10-CM | POA: Diagnosis present

## 2022-08-07 DIAGNOSIS — I429 Cardiomyopathy, unspecified: Secondary | ICD-10-CM | POA: Diagnosis present

## 2022-08-07 DIAGNOSIS — R0609 Other forms of dyspnea: Secondary | ICD-10-CM | POA: Diagnosis present

## 2022-08-07 DIAGNOSIS — I10 Essential (primary) hypertension: Secondary | ICD-10-CM

## 2022-08-07 DIAGNOSIS — Z8546 Personal history of malignant neoplasm of prostate: Secondary | ICD-10-CM | POA: Diagnosis not present

## 2022-08-07 DIAGNOSIS — Z79899 Other long term (current) drug therapy: Secondary | ICD-10-CM | POA: Diagnosis not present

## 2022-08-07 DIAGNOSIS — M542 Cervicalgia: Secondary | ICD-10-CM | POA: Diagnosis present

## 2022-08-07 DIAGNOSIS — Z888 Allergy status to other drugs, medicaments and biological substances status: Secondary | ICD-10-CM | POA: Diagnosis not present

## 2022-08-07 DIAGNOSIS — I5032 Chronic diastolic (congestive) heart failure: Secondary | ICD-10-CM | POA: Diagnosis present

## 2022-08-07 DIAGNOSIS — I130 Hypertensive heart and chronic kidney disease with heart failure and stage 1 through stage 4 chronic kidney disease, or unspecified chronic kidney disease: Secondary | ICD-10-CM | POA: Diagnosis present

## 2022-08-07 DIAGNOSIS — M7989 Other specified soft tissue disorders: Secondary | ICD-10-CM | POA: Diagnosis not present

## 2022-08-07 DIAGNOSIS — Z881 Allergy status to other antibiotic agents status: Secondary | ICD-10-CM | POA: Diagnosis not present

## 2022-08-07 DIAGNOSIS — Z6835 Body mass index (BMI) 35.0-35.9, adult: Secondary | ICD-10-CM | POA: Diagnosis not present

## 2022-08-07 DIAGNOSIS — Z7901 Long term (current) use of anticoagulants: Secondary | ICD-10-CM | POA: Diagnosis not present

## 2022-08-07 DIAGNOSIS — D696 Thrombocytopenia, unspecified: Secondary | ICD-10-CM | POA: Diagnosis present

## 2022-08-07 DIAGNOSIS — I160 Hypertensive urgency: Secondary | ICD-10-CM | POA: Diagnosis present

## 2022-08-07 DIAGNOSIS — Z86718 Personal history of other venous thrombosis and embolism: Secondary | ICD-10-CM | POA: Diagnosis not present

## 2022-08-07 DIAGNOSIS — N1832 Chronic kidney disease, stage 3b: Secondary | ICD-10-CM | POA: Diagnosis present

## 2022-08-07 DIAGNOSIS — M545 Low back pain, unspecified: Secondary | ICD-10-CM | POA: Diagnosis present

## 2022-08-07 DIAGNOSIS — M1612 Unilateral primary osteoarthritis, left hip: Secondary | ICD-10-CM | POA: Diagnosis present

## 2022-08-07 DIAGNOSIS — E785 Hyperlipidemia, unspecified: Secondary | ICD-10-CM | POA: Diagnosis present

## 2022-08-07 LAB — PROTIME-INR
INR: 1.5 — ABNORMAL HIGH (ref 0.8–1.2)
Prothrombin Time: 18.2 seconds — ABNORMAL HIGH (ref 11.4–15.2)

## 2022-08-07 LAB — CBC WITH DIFFERENTIAL/PLATELET
Abs Immature Granulocytes: 0.01 10*3/uL (ref 0.00–0.07)
Abs Immature Granulocytes: 0.05 10*3/uL (ref 0.00–0.07)
Basophils Absolute: 0 10*3/uL (ref 0.0–0.1)
Basophils Absolute: 0.1 10*3/uL (ref 0.0–0.1)
Basophils Relative: 1 %
Basophils Relative: 1 %
Eosinophils Absolute: 0.3 10*3/uL (ref 0.0–0.5)
Eosinophils Absolute: 0.4 10*3/uL (ref 0.0–0.5)
Eosinophils Relative: 6 %
Eosinophils Relative: 8 %
HCT: 25.8 % — ABNORMAL LOW (ref 39.0–52.0)
HCT: 28.2 % — ABNORMAL LOW (ref 39.0–52.0)
Hemoglobin: 8 g/dL — ABNORMAL LOW (ref 13.0–17.0)
Hemoglobin: 8.6 g/dL — ABNORMAL LOW (ref 13.0–17.0)
Immature Granulocytes: 0 %
Immature Granulocytes: 1 %
Lymphocytes Relative: 23 %
Lymphocytes Relative: 27 %
Lymphs Abs: 1.1 10*3/uL (ref 0.7–4.0)
Lymphs Abs: 1.5 10*3/uL (ref 0.7–4.0)
MCH: 25.9 pg — ABNORMAL LOW (ref 26.0–34.0)
MCH: 26.2 pg (ref 26.0–34.0)
MCHC: 30.5 g/dL (ref 30.0–36.0)
MCHC: 31 g/dL (ref 30.0–36.0)
MCV: 84.6 fL (ref 80.0–100.0)
MCV: 84.9 fL (ref 80.0–100.0)
Monocytes Absolute: 0.7 10*3/uL (ref 0.1–1.0)
Monocytes Absolute: 0.7 10*3/uL (ref 0.1–1.0)
Monocytes Relative: 12 %
Monocytes Relative: 13 %
Neutro Abs: 2.7 10*3/uL (ref 1.7–7.7)
Neutro Abs: 2.9 10*3/uL (ref 1.7–7.7)
Neutrophils Relative %: 52 %
Neutrophils Relative %: 56 %
Platelets: 141 10*3/uL — ABNORMAL LOW (ref 150–400)
Platelets: 143 10*3/uL — ABNORMAL LOW (ref 150–400)
RBC: 3.05 MIL/uL — ABNORMAL LOW (ref 4.22–5.81)
RBC: 3.32 MIL/uL — ABNORMAL LOW (ref 4.22–5.81)
RDW: 16.7 % — ABNORMAL HIGH (ref 11.5–15.5)
RDW: 16.7 % — ABNORMAL HIGH (ref 11.5–15.5)
WBC: 4.9 10*3/uL (ref 4.0–10.5)
WBC: 5.5 10*3/uL (ref 4.0–10.5)
nRBC: 0 % (ref 0.0–0.2)
nRBC: 0 % (ref 0.0–0.2)

## 2022-08-07 LAB — ECHOCARDIOGRAM COMPLETE
Area-P 1/2: 2.74 cm2
Calc EF: 46 %
Height: 74 in
MV M vel: 4.89 m/s
MV Peak grad: 95.6 mmHg
MV VTI: 1.84 cm2
S' Lateral: 4.4 cm
Single Plane A2C EF: 51.7 %
Single Plane A4C EF: 46.7 %
Weight: 4396.8 oz

## 2022-08-07 LAB — CBC
HCT: 28.1 % — ABNORMAL LOW (ref 39.0–52.0)
Hemoglobin: 8.6 g/dL — ABNORMAL LOW (ref 13.0–17.0)
MCH: 26.1 pg (ref 26.0–34.0)
MCHC: 30.6 g/dL (ref 30.0–36.0)
MCV: 85.2 fL (ref 80.0–100.0)
Platelets: 143 10*3/uL — ABNORMAL LOW (ref 150–400)
RBC: 3.3 MIL/uL — ABNORMAL LOW (ref 4.22–5.81)
RDW: 16.6 % — ABNORMAL HIGH (ref 11.5–15.5)
WBC: 5 10*3/uL (ref 4.0–10.5)
nRBC: 0 % (ref 0.0–0.2)

## 2022-08-07 MED ORDER — WARFARIN SODIUM 10 MG PO TABS
11.0000 mg | ORAL_TABLET | Freq: Once | ORAL | Status: DC
  Filled 2022-08-07: qty 1

## 2022-08-07 MED ORDER — ACETAMINOPHEN 325 MG PO TABS: 650.0000 mg | ORAL_TABLET | Freq: Four times a day (QID) | ORAL | Status: DC | PRN

## 2022-08-07 MED ORDER — WARFARIN - PHARMACIST DOSING INPATIENT: Freq: Every day | Status: DC

## 2022-08-07 MED ORDER — ACETAMINOPHEN 650 MG RE SUPP: 650.0000 mg | Freq: Four times a day (QID) | RECTAL | Status: DC | PRN

## 2022-08-07 MED ORDER — LABETALOL HCL 5 MG/ML IV SOLN
20.0000 mg | Freq: Once | INTRAVENOUS | Status: AC
  Administered 2022-08-07: 20 mg via INTRAVENOUS
  Filled 2022-08-07: qty 4

## 2022-08-07 MED ORDER — IOHEXOL 350 MG/ML SOLN
80.0000 mL | Freq: Once | INTRAVENOUS | Status: AC | PRN
  Administered 2022-08-07: 80 mL via INTRAVENOUS

## 2022-08-07 MED ORDER — ENOXAPARIN SODIUM 120 MG/0.8ML IJ SOSY
120.0000 mg | PREFILLED_SYRINGE | Freq: Two times a day (BID) | INTRAMUSCULAR | Status: DC
  Administered 2022-08-07 – 2022-08-08 (×3): 120 mg via SUBCUTANEOUS
  Filled 2022-08-07 (×3): qty 0.8

## 2022-08-07 MED ORDER — SODIUM CHLORIDE (PF) 0.9 % IJ SOLN
INTRAMUSCULAR | Status: AC
  Filled 2022-08-07: qty 50

## 2022-08-07 MED ORDER — ONDANSETRON HCL 4 MG PO TABS: 4.0000 mg | ORAL_TABLET | Freq: Four times a day (QID) | ORAL | Status: DC | PRN

## 2022-08-07 MED ORDER — LABETALOL HCL 5 MG/ML IV SOLN: 20.0000 mg | INTRAVENOUS | Status: DC | PRN

## 2022-08-07 MED ORDER — ONDANSETRON HCL 4 MG/2ML IJ SOLN: 4.0000 mg | Freq: Four times a day (QID) | INTRAMUSCULAR | Status: DC | PRN

## 2022-08-07 MED ORDER — HYDRALAZINE HCL 50 MG PO TABS
50.0000 mg | ORAL_TABLET | Freq: Three times a day (TID) | ORAL | Status: DC
  Administered 2022-08-07 – 2022-08-10 (×10): 50 mg via ORAL
  Filled 2022-08-07 (×9): qty 1

## 2022-08-07 MED ORDER — HEPARIN (PORCINE) 25000 UT/250ML-% IV SOLN
1800.0000 [IU]/h | INTRAVENOUS | Status: DC
  Administered 2022-08-07: 1800 [IU]/h via INTRAVENOUS
  Filled 2022-08-07: qty 250

## 2022-08-07 MED ORDER — HEPARIN BOLUS VIA INFUSION
3000.0000 [IU] | Freq: Once | INTRAVENOUS | Status: AC
  Administered 2022-08-07: 3000 [IU] via INTRAVENOUS
  Filled 2022-08-07: qty 3000

## 2022-08-07 NOTE — Progress Notes (Signed)
This is a 77 year old male with past medical history of high blood pressure, chronic venous stasis dermatitis L LE, HTN, CKD stage III, history of prostate cancer and history of DVT.  He is maintained on chronic anticoagulation therapy.  A few days ago he developed hip pain and dyspnea exertion.  He came to the ER.  In the ER CTA chest showed new pulm embolism B/L lung fields.  Patient's INR subtherapeutic at 1.5.  Heparin drip initiated.  Hospitalist admission requested

## 2022-08-07 NOTE — TOC Benefit Eligibility Note (Signed)
Patient Teacher, English as a foreign language completed.    The patient is currently admitted and upon discharge could be taking Eliquis 5 mg.  The current 30 day co-pay is $30.00.   The patient is currently admitted and upon discharge could be taking Xarelto 20 mg.  The current 30 day co-pay is $30.00.   The patient is insured through [employer] Part D   This test claim was processed through [place] amounts may vary at other pharmacies due to pharmacy/plan contracts, or as the patient moves through the different stages of their insurance plan.  Lyndel Safe, [place] Patient Advocate Specialist [place] Patient Advocate Team Direct Number: [phone]  Fax: [phone]

## 2022-08-07 NOTE — Progress Notes (Signed)
  Echocardiogram 2D Echocardiogram has been performed.  [name] 08/07/2022, 11:44 AM

## 2022-08-07 NOTE — ED Notes (Signed)
ED TO INPATIENT HANDOFF REPORT  ED Nurse Name and Phone #: Ronalee Belts [phone]  S Name/Age/Gender [name] 77 y.o. male Room/Bed: WA02/WA02  Code Status   Code Status: Full Code  Home/SNF/Other Home Patient oriented to: self, place, time, and situation Is this baseline? Yes   Triage Complete: Triage complete  Chief Complaint Pulmonary emboli [facility]) [I26.99]  Triage Note Pt was sent over here from the wound center after noticing systolic bp was over [id number]. Pt c/o right hip pain and knee swelling as well.   Pt on warfarin   Allergies Allergies  Allergen Reactions   Cephalexin Nausea And Vomiting    patient only took one pill and stated that it made him vomit   Mometasone Other (See Comments)    patient states the cream made sores on his leg    Level of Care/Admitting Diagnosis ED Disposition     ED Disposition  Admit   Condition  --   [place]: [place] [[id number]]  Level of Care: Progressive [102]  Admit to Progressive based on following criteria: RESPIRATORY PROBLEMS hypoxemic/hypercapnic respiratory failure that is responsive to NIPPV (BiPAP) or High Flow Nasal Cannula (6-80 lpm). Frequent assessment/intervention, no > Q2 hrs < Q4 hrs, to maintain oxygenation and pulmonary hygiene.  Admit to Progressive based on following criteria: CARDIOVASCULAR & THORACIC of moderate stability with acute coronary syndrome symptoms/low risk myocardial infarction/hypertensive urgency/arrhythmias/heart failure potentially compromising stability and stable post cardiovascular intervention patients.  May admit patient to [facility] or [name] if equivalent level of care is available:: No  Covid Evaluation: Asymptomatic - no recent exposure (last 10 days) testing not required  Diagnosis: Pulmonary emboli [facility]) [id number]  Admitting Physician: Reubin Milan [id number]  Attending Physician: Reubin Milan [id number]  Certification:: I certify  this patient will need inpatient services for at least 2 midnights  Estimated Length of Stay: 2          B Medical/Surgery History Past Medical History:  Diagnosis Date   Anemia    Arthritis    CKD STAGE 3A    PER DR. Vikki Ports CONNOR ON 01/10/2021 OFFICE VISIT   Difficulty sleeping    DVT (deep venous thrombosis) (HCC)    rt leg 3 yrs ago   Hypertension    Leg cramps    Lymphedema    Prostate cancer ([place])    Swelling of lower extremity    RT LEG   Varicose veins    both legs when he was 77 yrs old. 01/25/2021   Wears glasses    wears glasses for driving.  01/25/2021   Past Surgical History:  Procedure Laterality Date   HERNIA REPAIR  05/19/2010   INGUINAL   HYDROCELE EXCISION Left 09/18/2021   Procedure: HYDROCELECTOMY ADULT;  Surgeon: Alexis Frock, MD;  Location: [facility];  Service: Urology;  Laterality: Left;  1 HR   INGUINAL HERNIA REPAIR Left 01/29/2021   Procedure: LAPAROSCOPIC LEFT INGUINAL HERNIA REPAIR WITH MESH;  Surgeon: Stechschulte, Nickola Major, MD;  Location: [place];  Service: General;  Laterality: Left;   LYMPHADENECTOMY Bilateral 12/30/2013   Procedure: PELVIC LYMPH NODE DISSECTION;  Surgeon: Alexis Frock, MD;  Location: [facility];  Service: Urology;  Laterality: Bilateral;   NASAL SINUS SURGERY     ORCHIOPEXY Left 09/18/2021   Procedure: ORCHIOPEXY ADULT;  Surgeon: Alexis Frock, MD;  Location: [facility];  Service: Urology;  Laterality: Left;   PROSTATE BIOPSY     ROBOT ASSISTED  LAPAROSCOPIC RADICAL PROSTATECTOMY N/A 12/30/2013   Procedure: ROBOTIC ASSISTED LAPAROSCOPIC RADICAL PROSTATECTOMY WITH INDOCYANINE GREEN DYE;  Surgeon: Alexis Frock, MD;  Location: [facility];  Service: Urology;  Laterality: N/A;   VEIN SURGERY     RT LEG  had surgery 36 yrs ago. 01/25/2021     A IV Location/Drains/Wounds Patient Lines/Drains/Airways Status     Active Line/Drains/Airways     Name Placement date Placement time Site Days   Peripheral IV 08/07/22  20 G 1" Anterior;Distal;Left;Upper Arm 08/07/22  0012  Arm  less than 1   Peripheral IV 08/07/22 22 G Right Antecubital 08/07/22  0600  Antecubital  less than 1   Open Drain 1 Left Other (Comment)  09/18/21  1702  Other (Comment)  323   Incision (Closed) 01/29/21 Abdomen 01/29/21  1159  -- 555   Incision (Closed) 09/18/21 Penis Other (Comment) 09/18/21  1641  -- 323   Incision - 3 Ports Abdomen Umbilicus Left;Mid Right;Mid 01/29/21  1152  -- 555            Intake/Output Last 24 hours No intake or output data in the 24 hours ending 08/07/22 0806  Labs/Imaging Results for orders placed or performed during the hospital encounter of 08/06/22 (from the past 48 hour(s))  Basic metabolic panel     Status: Abnormal   Collection Time: 08/06/22  8:20 PM  Result Value Ref Range   Sodium 137 135 - 145 mmol/L   Potassium 4.5 3.5 - 5.1 mmol/L   Chloride 110 98 - 111 mmol/L   CO2 22 22 - 32 mmol/L   Glucose, Bld 102 (H) 70 - 99 mg/dL    Comment: Glucose reference range applies only to samples taken after fasting for at least 8 hours.   BUN 37 (H) 8 - 23 mg/dL   Creatinine, Ser 1.56 (H) 0.61 - 1.24 mg/dL   Calcium 8.8 (L) 8.9 - 10.3 mg/dL   GFR, Estimated 45 (L) >60 mL/min    Comment: (NOTE) Calculated using the CKD-EPI Creatinine Equation (2021)    Anion gap 5 5 - 15    Comment: Performed at [facility], [place] [address], [place], [place] [address]  Brain natriuretic peptide     Status: Abnormal   Collection Time: 08/06/22  8:20 PM  Result Value Ref Range   B Natriuretic Peptide 184.4 (H) 0.0 - 100.0 pg/mL    Comment: Performed at [facility] [place] [address] [place], [place] [address]  D-dimer, quantitative     Status: Abnormal   Collection Time: 08/06/22  8:20 PM  Result Value Ref Range   D-Dimer, Quant 1.15 (H) 0.00 - 0.50 ug/mL-FEU    Comment: (NOTE) At the manufacturer cut-off value of 0.5 g/mL FEU, this assay has a negative predictive value  of 95-100%.This assay is intended for use in conjunction with a clinical pretest probability (PTP) assessment model to exclude pulmonary embolism (PE) and deep venous thrombosis (DVT) in outpatients suspected of PE or DVT. Results should be correlated with clinical presentation. Performed at [facility], [place] [address] [place], [place] [address]   CBC with Differential     Status: Abnormal   Collection Time: 08/07/22 12:19 AM  Result Value Ref Range   WBC 4.9 4.0 - 10.5 K/uL   RBC 3.05 (L) 4.22 - 5.81 MIL/uL   Hemoglobin 8.0 (L) 13.0 - 17.0 g/dL   HCT 25.8 (L) 39.0 - 52.0 %   MCV 84.6 80.0 -  100.0 fL   MCH 26.2 26.0 - 34.0 pg   MCHC 31.0 30.0 - 36.0 g/dL   RDW 16.7 (H) 11.5 - 15.5 %   Platelets 141 (L) 150 - 400 K/uL   nRBC 0.0 0.0 - 0.2 %   Neutrophils Relative % 56 %   Neutro Abs 2.7 1.7 - 7.7 K/uL   Lymphocytes Relative 23 %   Lymphs Abs 1.1 0.7 - 4.0 K/uL   Monocytes Relative 13 %   Monocytes Absolute 0.7 0.1 - 1.0 K/uL   Eosinophils Relative 6 %   Eosinophils Absolute 0.3 0.0 - 0.5 K/uL   Basophils Relative 1 %   Basophils Absolute 0.1 0.0 - 0.1 K/uL   Immature Granulocytes 1 %   Abs Immature Granulocytes 0.05 0.00 - 0.07 K/uL    Comment: Performed at [facility], [place] [address], [place], [place] [address]  Protime-INR     Status: Abnormal   Collection Time: 08/07/22 12:19 AM  Result Value Ref Range   Prothrombin Time 18.2 (H) 11.4 - 15.2 seconds   INR 1.5 (H) 0.8 - 1.2    Comment: (NOTE) INR goal varies based on device and disease states. Performed at [facility], [place] [address] [place], [place] [address]   CBC     Status: Abnormal   Collection Time: 08/07/22  6:05 AM  Result Value Ref Range   WBC 5.0 4.0 - 10.5 K/uL   RBC 3.30 (L) 4.22 - 5.81 MIL/uL   Hemoglobin 8.6 (L) 13.0 - 17.0 g/dL   HCT 28.1 (L) 39.0 - 52.0 %   MCV 85.2 80.0 - 100.0 fL   MCH 26.1 26.0 - 34.0 pg   MCHC 30.6 30.0 - 36.0 g/dL    RDW 16.6 (H) 11.5 - 15.5 %   Platelets 143 (L) 150 - 400 K/uL   nRBC 0.0 0.0 - 0.2 %    Comment: Performed at [facility], [place] [address] [place], [place] [address]   CT Angio Chest PE W and/or Wo Contrast  Result Date: 08/07/2022 CLINICAL DATA:  Shortness of breath, hypertension and elevated D-dimer. EXAM: CT ANGIOGRAPHY CHEST WITH CONTRAST TECHNIQUE: Multidetector CT imaging of the chest was performed using the standard protocol during bolus administration of intravenous contrast. Multiplanar CT image reconstructions and MIPs were obtained to evaluate the vascular anatomy. RADIATION DOSE REDUCTION: This exam was performed according to the departmental dose-optimization program which includes automated exposure control, adjustment of the mA and/or kV according to patient size and/or use of iterative reconstruction technique. CONTRAST:  86mL OMNIPAQUE IOHEXOL 350 MG/ML SOLN COMPARISON:  CTA chest 03/11/2022, CT abdomen and pelvis without contrast 07/12/2021. Also AP Lat chest today and 03/11/2022. FINDINGS: Cardiovascular: Stable mild cardiomegaly with left chamber predominance. There are three-vessel coronary calcific plaques. No IVC reflux or other findings of acute right heart strain. Pulmonary arteries are normal in caliber. There is some degree of respiratory motion on exam, limiting evaluation of the subsegmental arterial bed in the lower lung fields. In the right upper lobe, thrombus is seen in a vertical first order segmental division distally on 7:45 and is suspected in at least 2 subsegmental arteries to the downstream apical distribution. On the axial images this is best seen on 4: 24-29. In the left upper lobe, subsegmental nonocclusive emboli are noted on 7: 50-52 and suspected in at least 3 downstream apical small arteries. On the axial images this is best seen on 4: 25-33. There are no other  visible emboli although subsegmental emboli could be missed in the lower zones.  The pulmonary veins are normal caliber. There are moderate aortic calcific plaques without aneurysm, stenosis or dissection. The great vessels are clear. There is no pericardial effusion. Mediastinum/Nodes: No enlarged mediastinal, hilar, or axillary lymph nodes. Thyroid gland, trachea, and esophagus demonstrate no significant findings. Lungs/Pleura: No pleural effusion, thickening or pneumothorax. The lungs clear accounting for breathing motion, with again noted mild elevation of the right diaphragm. Upper Abdomen: No acute abnormality is seen through the breathing motion. Small stable cyst medial right kidney. Musculoskeletal: Moderate bilateral gynecomastia, but appears to be of the diffuse fibroglandular form and is symmetric. There are degenerative changes of the spine and osteopenia. No acute or other significant osseous findings. Review of the MIP images confirms the above findings. IMPRESSION: 1. Small volume of pulmonary emboli in the right upper lobe and left upper lobe as described above. 2. Cardiomegaly without evidence of acute right heart strain. 3. Aortic and coronary artery atherosclerosis. 4. Moderate bilateral gynecomastia. 5. Osteopenia and degenerative change. Aortic Atherosclerosis (ICD10-I70.0). Electronically Signed   By: Telford Nab M.D.   On: 08/07/2022 01:08   DG Knee 2 Views Right  Result Date: 08/06/2022 CLINICAL DATA:  Concern for femoral neck fracture. Injury 3 weeks ago still painful. EXAM: RIGHT KNEE - 1-2 VIEW; PELVIS - 1-2 VIEW COMPARISON:  CT abdomen and pelvis and reconstructions 07/12/2021. Right knee series 03/12/2022. FINDINGS: AP pelvis, 2 films obtained: No pelvic or proximal femoral fractures are seen AP. Near bone-on-bone axial joint space loss at the right hip is noted with subcortical degenerative cystic changes, with less advanced left hip nonerosive DJD. The SI joints are symmetric. Widening of the pubic symphysis up to 1.5 cm is similar to the previous exam  particularly when the scout image is compared. This could be due to remote pelvic trauma as there does not appear to be erosive disease in the pubic bone. There is pelvic enthesopathy.  There is normal bone mineralization. No other focal abnormality is seen. Vasectomy clips in the superior scrotal areas are again noted. Tubular calcifications in the femoral arteries are present with the appearance suggesting diabetic related atherosclerosis. AP and lateral right knee: There is generalized edema in the soft tissues which was seen previously. There are patchy arterial calcific plaques in the distal thigh and foreleg. There is normal bone mineralization without evidence of fractures. There are mild features of degenerative arthrosis, nonerosive, with meniscal chondrocalcinosis and small enthesopathic spurs of the anterior patella. No loose body is evident. No primary pathologic bone lesion is seen. IMPRESSION: 1. No evidence of fractures. 2. Generalized edema in the soft tissues. 3. Chronic widening of the pubic symphysis, could be due to remote pelvic trauma. 4. Right-greater-than-left hip DJD. 5. Arterial calcific plaques in the distal thigh and foreleg. 6. Vascular calcifications in the femoral arteries suggesting diabetic related atherosclerosis. Electronically Signed   By: Telford Nab M.D.   On: 08/06/2022 23:39   DG Pelvis 1-2 Views  Result Date: 08/06/2022 CLINICAL DATA:  Concern for femoral neck fracture. Injury 3 weeks ago still painful. EXAM: RIGHT KNEE - 1-2 VIEW; PELVIS - 1-2 VIEW COMPARISON:  CT abdomen and pelvis and reconstructions 07/12/2021. Right knee series 03/12/2022. FINDINGS: AP pelvis, 2 films obtained: No pelvic or proximal femoral fractures are seen AP. Near bone-on-bone axial joint space loss at the right hip is noted with subcortical degenerative cystic changes, with less advanced left hip nonerosive DJD. The SI joints  are symmetric. Widening of the pubic symphysis up to 1.5 cm is  similar to the previous exam particularly when the scout image is compared. This could be due to remote pelvic trauma as there does not appear to be erosive disease in the pubic bone. There is pelvic enthesopathy.  There is normal bone mineralization. No other focal abnormality is seen. Vasectomy clips in the superior scrotal areas are again noted. Tubular calcifications in the femoral arteries are present with the appearance suggesting diabetic related atherosclerosis. AP and lateral right knee: There is generalized edema in the soft tissues which was seen previously. There are patchy arterial calcific plaques in the distal thigh and foreleg. There is normal bone mineralization without evidence of fractures. There are mild features of degenerative arthrosis, nonerosive, with meniscal chondrocalcinosis and small enthesopathic spurs of the anterior patella. No loose body is evident. No primary pathologic bone lesion is seen. IMPRESSION: 1. No evidence of fractures. 2. Generalized edema in the soft tissues. 3. Chronic widening of the pubic symphysis, could be due to remote pelvic trauma. 4. Right-greater-than-left hip DJD. 5. Arterial calcific plaques in the distal thigh and foreleg. 6. Vascular calcifications in the femoral arteries suggesting diabetic related atherosclerosis. Electronically Signed   By: Telford Nab M.D.   On: 08/06/2022 23:39   DG Chest 2 View  Result Date: 08/06/2022 CLINICAL DATA:  Shortness of breath EXAM: CHEST - 2 VIEW COMPARISON:  03/11/2022 FINDINGS: Borderline to mild cardiomegaly. Both lungs are clear. The visualized skeletal structures are unremarkable. IMPRESSION: No active cardiopulmonary disease. Borderline to mild cardiomegaly. Electronically Signed   By: Donavan Foil M.D.   On: 08/06/2022 20:21    Pending Labs Unresulted Labs (From admission, onward)     Start     Ordered   08/08/22 0500  Comprehensive metabolic panel  Tomorrow morning,   R        08/07/22 0752    08/07/22 1000  Heparin level (unfractionated)  Once-Timed,   URGENT        08/07/22 0255   08/07/22 0700  CBC with Differential/Platelet  Once,   STAT        08/07/22 0218   08/07/22 0500  CBC  Daily,   R      08/07/22 0148            Vitals/Pain Today's Vitals   08/07/22 0320 08/07/22 0530 08/07/22 0624 08/07/22 0700  BP: (!) 185/75 (!) 165/75  (!) 158/78  Pulse: 95 69  69  Resp: (!) 23 (!) 21  20  Temp:   98.1 F (36.7 C)   TempSrc:   Oral   SpO2: 98% 100%  99%  Weight:      PainSc:        Isolation Precautions No active isolations  Medications Medications  heparin ADULT infusion 100 units/mL (25000 units/263mL) (1,800 Units/hr Intravenous New Bag/Given 08/07/22 0200)  acetaminophen (TYLENOL) tablet 650 mg (has no administration in time range)    Or  acetaminophen (TYLENOL) suppository 650 mg (has no administration in time range)  ondansetron (ZOFRAN) tablet 4 mg (has no administration in time range)    Or  ondansetron (ZOFRAN) injection 4 mg (has no administration in time range)  hydrALAZINE (APRESOLINE) tablet 50 mg (50 mg Oral Given 08/06/22 2325)  iohexol (OMNIPAQUE) 350 MG/ML injection 80 mL (80 mLs Intravenous Contrast Given 08/07/22 0027)  sodium chloride (PF) 0.9 % injection (  Given by Other 08/07/22 0032)  heparin bolus via infusion 3,000 Units (  3,000 Units Intravenous Bolus from Bag 08/07/22 0200)    Mobility walks     Focused Assessments Pulmonary Assessment Handoff:  Lung sounds:   O2 Device: Room Air      R Recommendations: See Admitting Provider Note  Report given to:   Additional Notes: .

## 2022-08-07 NOTE — Progress Notes (Addendum)
[name] [name] ([id number].pdf Page 1 of 11 Visit Report for 08/06/2022 Chief Complaint Document Details Patient Name: Date of Service: [name] [name] 08/06/2022 12:45 PM Medical Record Number: [id number] Patient Account Number: [id number] Date of Birth/Sex: Treating RN: 1945/10/09 (77 y.o. M) Primary Care Provider: Birdie [name] Other Clinician: Referring Provider: Treating Provider/Extender: [name] [name] in Treatment: 24 Information Obtained from: Patient Chief Complaint 02/23/2020; patient is here for wounds on his bilateral lower legs in the setting of severe lymphedema 03/26/2022; patient is here for wounds on his bilateral lower legs medial aspect Electronic Signature(s) Signed: 08/06/2022 1:42:15 PM By: [name] Keeler PA-C Entered By: [name] [name] on 08/06/2022 13:42:15 -------------------------------------------------------------------------------- Debridement Details Patient Name: Date of Service: [name] [name] 08/06/2022 12:45 PM Medical Record Number: [id number] Patient Account Number: [id number] Date of Birth/Sex: Treating RN: 20-Apr-1946 (77 y.o. [name] [name] Primary Care Provider: Birdie [name] Other Clinician: Referring Provider: Treating Provider/Extender: [name] [name] in Treatment: 19 Debridement Performed for Assessment: Wound #6 Right,Medial Lower Leg Performed By: Physician [name] Keeler, PA Debridement Type: Debridement Level of Consciousness (Pre-procedure): Awake and Alert Pre-procedure Verification/Time Out Yes - 01:50 Taken: Start Time: 01:50 Pain Control: Lidocaine 4% T opical Solution T Area Debrided (L x W): otal 12 (cm) x 6 (cm) = 72 (cm) Tissue and other material debrided: Viable, Non-Viable, Slough, Subcutaneous, Biofilm, Slough Level: Skin/Subcutaneous Tissue Debridement Description: Excisional Instrument: Curette Bleeding: Large Hemostasis  Achieved: Pressure End Time: 13:59 Procedural Pain: 0 Response to Treatment: Procedure was tolerated well Level of Consciousness (Post- Awake and Alert procedure): Post Debridement Measurements of Total Wound Length: (cm) 14 Width: (cm) 19 Depth: (cm) 0.1 Volume: (cm) 20.892 Character of Wound/Ulcer Post Debridement: Improved Post Procedure Diagnosis Same as Pre-procedure Electronic Signature(s) Signed: 08/06/2022 5:04:23 PM By: [name] Pilling RN, [name] Signed: 08/06/2022 6:05:31 PM By: [name] Keeler PA-C Entered By: [name] [name] on 08/06/2022 13:59:49 [name] [name] [name] ([id number].pdf Page 2 of 11 -------------------------------------------------------------------------------- Debridement Details Patient Name: Date of Service: [name] [name] 08/06/2022 12:45 PM Medical Record Number: [id number] Patient Account Number: [id number] Date of Birth/Sex: Treating RN: [birth year] (77 y.o. [name] [name] Primary Care Provider: Birdie [name] Other Clinician: Referring Provider: Treating Provider/Extender: [name] [name] in Treatment: 19 Debridement Performed for Assessment: Wound #7 Left,Circumferential Lower Leg Performed By: Physician [name] Keeler, PA Debridement Type: Debridement Level of Consciousness (Pre-procedure): Awake and Alert Pre-procedure Verification/Time Out Yes - 01:50 Taken: Start Time: 01:50 Pain Control: Lidocaine 4% T opical Solution T Area Debrided (L x W): otal 5 (cm) x 5 (cm) = 25 (cm) Tissue and other material debrided: Viable, Non-Viable, Slough, Subcutaneous, Biofilm, Slough Level: Skin/Subcutaneous Tissue Debridement Description: Excisional Instrument: Curette Bleeding: Large Hemostasis Achieved: Pressure End Time: 13:59 Procedural Pain: 0 Response to Treatment: Procedure was tolerated well Level of Consciousness (Post- Awake and Alert procedure): Post Debridement Measurements of Total  Wound Length: (cm) 14 Width: (cm) 39 Depth: (cm) 0.1 Volume: (cm) 42.883 Character of Wound/Ulcer Post Debridement: Improved Post Procedure Diagnosis Same as Pre-procedure Electronic Signature(s) Signed: 08/06/2022 5:04:23 PM By: [name] Pilling RN, [name] Signed: 08/06/2022 6:05:31 PM By: [name] Keeler PA-C Entered By: [name] [name] on 08/06/2022 14:00:12 -------------------------------------------------------------------------------- HPI Details Patient Name: Date of Service: [name] [name] 08/06/2022 12:45 PM Medical Record Number: [id number] Patient Account Number: [id number] Date of Birth/Sex: Treating RN: 1945/10/30 (77 y.o. M) Primary Care Provider: Birdie [name] Other Clinician:  Referring Provider: Treating Provider/Extender: [name] [name] in Treatment: 19 History of Present Illness HPI Description: ADMISSION 02/23/2020 Patient is a 77 year old man who lives in [place] who arrives accompanied by his wife. He has a history of chronic lymphedema and venous insufficiency in his bilateral lower legs which may have something to do that with having a history of DVT as well as being treated for prostate cancer. In any case he recently got compression pumps at home but compliance has been an issue here. He has compression stockings however they are probably not sufficient enough to control swelling. They tell us that things deteriorated for him in late August he was admitted to [facility] for 7 days. This was with cellulitis I think of his bilateral lower legs. Discharge he was noted to have wounds on his bilateral lower legs. He was discharged on Bactrim. They tried to get him home health through [facility] of course they declined him. His wife is been wrapping these applying some form of silver foam dressing. He has a history of wounds before although nothing that would not heal with basic home topical dressings. He has 2 areas on the  left medial, left anterior and left lateral and a smaller area on the right medial. All of these have considerable depth. Past medical history includes iron deficiency anemia, lymphedema followed by the rehab center at [facility] with lymphedema wraps I believe, DVT on chronic anticoagulation, prostate cancer, chronic venous insufficiency, hypertension. As mentioned he has compression pumps but does not use them. Palin, [name] [name] ([id number] Page 3 of 11 ABIs in our clinic were noncompressible bilaterally 10/14; patient with severe bilateral lymphedema right greater than left. He came in with bilateral lower extremity wounds left greater than right. Even though the right side has more of the edema most of the wounds here almost closed on the right medial. He has 3 remaining wounds on the left We have been using silver alginate under 4-layer compression I have been trying to get him to be compliant with his external compression pumps 10/21; patient with 3 small wounds on the left leg and 1 on the right medial in the setting of severe lymphedema and chronic venous insufficiency. We have been using silver alginate under 4-layer compression he is using his external compression pumps twice a day 11/4; ARTERIAL STUDIES on the right show an ABI of 1.02 TBI of 0.858 with biphasic waveforms on the left 0.98 with a TBI of 0.55 and biphasic waveforms. Does not look like he has significant arterial disease. We are treating him for lymphedema he has compression pumps. He has punched-out areas on the left anterior left lateral and right medial lower extremities 11/11; after we obtained his arterial studies I put him in 4 layer compression. He is using his compression pumps probably once a day although I have asked him to do twice. Primary dressing to the wound is silver collagen he has severe lymphedema likely secondary to chronic venous insufficiency. Wounds on the left  lateral, left medial and left anterior and a small area on the right medial 12/2; the area on the right anterior lower leg has healed. We initially thought that the area medially had healed as well however when her discharge nurse came in she detected fluid in the wound simply opened up. This is actually worse than I remember this pain. The area on the left lateral potentially slightly smaller He is also complaining about  pain in his left hand he says that this is actually been getting some better he has been using topical creams on this. She asked that I look at this 12/9 after last [name] issues we have 2 wounds one on the right medial lower leg and 1 on the left lateral. Both of these are in the same condition. I think because of thickened skin secondary to chronic lymphedema these wounds actually have depth of almost 0.8 cm. 12/16; the patient has 2 small but deep wounds one on the right medial and one on the left lateral. The right medial is actually the worst of these. He arrives in clinic today with absolutely terrible edema in the right leg apparently his 4-layer wrap fell down to just above his ankle he did not think about this he is apparently been continuing to use his compression pump twice a day. The left leg looks a lot better. 05/09/2020 upon evaluation today patient appears to be doing decently well in regard to his wounds. Everything is measuring smaller the right leg still has a little bit deeper wound in the left seems to be almost completely healed in my opinion I am very pleased in general with how things are progressing. He has a 4- layer compression wrap we have been using endoform today we will probably have to use collagen just based on the fact that we do not have endoform it is on order. 1/6; the patient's wound on the left lateral lower leg has healed. Still has 1 on the right medial. 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 Using compression pumps at home  twice a day. 1/13; left lateral lower leg is still healed. He has a deep punched out rectangular shaped wound on the right medial calf. Looking down at this it appears that he is attempting to epithelialize around the edges of the wound and on the base as well. His edema is reasonably well controlled we have been using collagen with absolutely no effect 1/20; left lateral lower leg remains closed he has extremitease stockings. The area on the right medial calf I aggressively debrided last week measures larger but the surface looks better. We have been using Hydrofera Blue. We ran Oasis through his insurance but we have not seen the results of this 1/27; left lower leg wound with chronic venous insufficiency and secondary lymphedema. I did aggressive debridement on this last week the wound seems to have come in healthy looking surface using Hydrofera Blue. He was denied for Oasis 2/3; small divot in the right medial lower leg. Under illumination the walls of this divot are epithelialized however the base has slough which I removed with a curette we have been using Hydrofera Blue 2/10 small divot on the right medial lower leg pinpoint illumination at the base of this cone-shaped wound. We have been using Hydrofera Blue but I will switch to calcium alginate this week 2/17; the small divot on the right medial lower leg is fully epithelialized. There is no visible open area under illumination. He has his own stocking for the right leg similar to the one he has been wearing on the left. 03/26/2022; READMISSION This is a now 77 year old man that we had in the clinic from 02/23/2020 through 07/05/2020. At that point he had bilateral lower extremity wounds left greater than right in the setting of severe lymphedema. He had already obtained compression pumps ordered for him I think from the wound care clinic in [facility] so  I do not really have record of what he has been using. He claims to be using  them once a day but there is a problem with the sleeve on the left leg. About 2 [name] ago he was hospitalized from 03/11/2022 through [id number] with diastolic congestive heart failure. 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 He was diuresed. Developed some prerenal azotemia and he has not been taking any diuretics currently. He has not been putting stockings on his legs since he got out of hospital and still has his legs dependent for long periods. Past medical history history of prostate cancer treated with prostatectomy and radiation this was apparently about 8 years ago, history of DVT on chronic Coumadin, history of lymphedema was managed for a while at the clinic in [place]. History of inguinal hernia repair in September 22, hypertension, stage IIIb chronic renal failure ABIs today were noncompressible on the right 1.12 on the left 04-02-2022 upon evaluation today patient appears to be doing well currently in regard to his legs I do feel like both areas that are draining are actually much drier than they were in the picture last week although the left is drier than the right. He is tolerating the 4-layer compression wraps at this point he did contact the pump company and they are actually working on getting him a new compression sleeve for one of his legs which have previously popped and was not functioning properly. 04-23-2022 upon evaluation today patient appears to be doing well currently in regard to his wounds on the legs. I am actually very pleased with where things stand and I do feel like that we are headed in the right direction. Fortunately there is no sign of active infection locally or systemically at this time. 05-07-2022 upon evaluation today patient appears to be doing well currently in regard to his wounds in fact things are showing signs of improvement which is good news I do not see too much that actually appears to be open and  I am very pleased in that regard. No fevers, chills, nausea, vomiting, or diarrhea. 05-21-2022 upon evaluation today patient appears to be doing somewhat poorly in regard to drainage of his lower extremities bilaterally. The right is greater than left as far as the weeping area. Nonetheless it seems to be getting worse not better. He actually has pitting edema which is at least 2+ to the thighs and I am concerned about the fact that he is may be fluid overloaded in general and that is the reason why we cannot get this under control. I know he is not using his pumps all the time because he actually told the nurse that he was either going to pump or he was going to use his fluid pills but not do both. For that reason I do think that he needs to be really doing both in order to get the fluid out as effectively as possible obviously with the 4-layer compression wraps were doing as much as we can from a compression standpoint but it is really not enough. He tells me that he elevates his leg is much as he can in between pumping and other activity throughout the day. 05-28-2022 upon evaluation today patient appears to be doing better in regard to his wounds although the measurements may be a little bit larger this is a very difficult wound to heal it is very indistinct in a lot of areas. Nonetheless there is can  be some need for sharp debridement in regard to both medial and lateral legs. Fortunately I see no signs of active infection locally nor systemically at this time. No fevers, chills, nausea, vomiting, or diarrhea. 06-04-2022 upon evaluation today patient appears to be doing poorly in general in regard to the wounds on his legs. He still continues to have a tremendous amount of fluid not just in the lower portion of his leg but to be honest his thigh where he has 2-3+ pitting edema in the thigh as well. Unfortunately I do not know that we will be able to get this healed effectively and keep it healed on the  lower extremities unless he gets the overall fluid situation taking and under [place], Kyreese ([id number]) [phone].pdf Page 4 of 11 control. Fortunately I do not see any signs of infection locally nor systemically which is great news. He just seems to be very fluid overloaded. 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 He has been tolerating the dressing changes with the compression wraps without complication. Fortunately I do not see any evidence of infection at this time which is great news. No fevers, chills, nausea, vomiting, or diarrhea. 06-18-2022 upon evaluation today patient appears to be doing well currently in regard to his wounds as far as not looking like they are terribly infected but nonetheless I am concerned about a subacute infection secondary to the fact that he continues to have spreading despite the compression therapy. We actually did do an Unna boot on him last week this is actually the first wrap that actually stayed up everything else has been sliding down quite significantly. Fortunately there does not appear to be any signs of infection systemically at this time. With that being said I do believe that locally there seems to be an issue going on here and again I Georgina Peer do a PCR culture to see what that shows also think that I am going to put him on a broad-spectrum antibiotic, doxycycline to see how that will help as well. He does tell me that coming into the clinic today that he was feeling short of breath like "he was about to have a heart attack" because he was having such a hard time breathing. He says that he told this to Dr. Debbora Dus his cardiologist as well when he was evaluated in the past 1 to 2 [name]. 07-02-2022 upon evaluation today patient appears to be doing poorly currently in regard to his wound. He has been tolerating the  dressing changes. Unfortunately he has not had any compression wraps on for the past week because he was unable to make it in for his appointment last week. With that being said he has a significant amount of drainage he tells me has been using his pumps but despite this in the pumps he still has been draining quite a bit. The drainage is also somewhat purulent unfortunately. We did attempt to get in touch with his cardiologist last week unfortunately we were unable to get up with him I did advise that the patient needs to get in touch with him upon leaving today in order to make sure they know he is on the new antibiotics I am going to send him this will be Levaquin and Augmentin. 07-09-2022 upon evaluation today patient appears to be doing about the same in regard to his legs he may have just a slight amount of improvement with regard to the  drainage probably Keystone topical antibiotics are helping in this regard to some degree. Fortunately there does not appear to be any signs of active infection systemically which is great news. No fevers, chills, nausea, vomiting, or diarrhea. 07-16-2022 upon evaluation today patient appears to be doing well currently in regard to his wound. He has been tolerating the dressing changes without complication. Fortunately there does not appear to be any signs of active infection locally nor systemically at this time. With that being said he cannot keep the wraps up he tells me on the left side he had to cut this down because it got too tight. He has been using his pumps but he is on the right side the wrap actually straight down causing some pushing around the central part of his leg just below the calf I think this is a bigger risk for him that help at this point. I think that we may need to try something different he should be getting his compression socks shortly he tells me they were ordered last Thursday. 3/6; ; this is a patient who lives in [place]. He has  severe bilateral lymphedema. He has compression pumps, we have been using kerlix Ace wrap Keystone. He is changing the dressing. We do not have home health. 08-06-2022 upon evaluation today patient appears to be doing a little better in regard to his wounds in general at this point. Fortunately there does not appear to be any signs of active infection locally nor systemically at this time which is great news and overall I am extremely pleased with where we stand today. Electronic Signature(s) Signed: 08/06/2022 5:56:19 PM By: [name] Keeler PA-C Entered By: [name] [name] on 08/06/2022 17:56:18 -------------------------------------------------------------------------------- Physical Exam Details Patient Name: Date of Service: [name] [name] 08/06/2022 12:45 PM Medical Record Number: [id number] Patient Account Number: [id number] Date of Birth/Sex: Treating RN: [birth year] (77 y.o. M) Primary Care Provider: Birdie [name] Other Clinician: Referring Provider: Treating Provider/Extender: [name] [name] in Treatment: 34 Constitutional Well-nourished and well-hydrated in no acute distress. Respiratory normal breathing without difficulty. Psychiatric this patient is able to make decisions and demonstrates good insight into disease process. Alert and Oriented x 3. pleasant and cooperative. Notes Upon inspection patient's wounds did require sharp debridement I did perform debridement today to clearway the necrotic debris he tolerated that without complication postdebridement the wound bed is significantly improved which is great news overall I am extremely pleased with where we stand today. Electronic Signature(s) Signed: 08/06/2022 5:57:12 PM By: [name] Keeler PA-C Entered By: [name] [name] on 08/06/2022 17:57:12 -------------------------------------------------------------------------------- Physician Orders Details Patient Name: Date of Service: [name] [name]  08/06/2022 12:45 PM Medical Record Number: [id number] Patient Account Number: [id number] Date of Birth/Sex: Treating RN: [birth year] (77 y.o. [name] Primary Care Provider: Birdie [name] Other Clinician: Savoca, [name] [name] ([id number]) [id number] Page 5 of 11 Referring Provider: Treating Provider/Extender: [name] [name] in Treatment: 12 Verbal / Phone Orders: No Diagnosis Coding ICD-10 Coding Code Description I87.333 Chronic venous hypertension (idiopathic) with ulcer and inflammation of bilateral lower extremity I89.0 Lymphedema, not elsewhere classified 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 other part of right lower leg with other specified severity Follow-up Appointments ppointment in 1 week. Margarita Grizzle Wednesday 08/13/2022 1245 Return A ppointment in 2 [name]. Margarita Grizzle Wednesday 08/20/2022 1230 Return A Other: - ***Change daily. *****  Anesthetic (In clinic) Topical Lidocaine 5% applied to wound bed Bathing/ Shower/ Hygiene May shower with protection but do not get wound dressing(s) wet. Protect dressing(s) with water repellant cover (for example, large plastic bag) or a cast cover and may then take shower. Edema Control - Lymphedema / SCD / Other Lymphedema Pumps. Use Lymphedema pumps on leg(s) 2-3 times a day for 45-60 minutes. If wearing any wraps or hose, do not remove them. Continue exercising as instructed. - *****pump 3 times a day for an hour each time.***** Elevate legs to the level of the heart or above for 30 minutes daily and/or when sitting for 3-4 times a day throughout the day. - important Avoid standing for long periods of time. Exercise regularly Compression stocking or Garment 30-40 mm/Hg pressure to: - juxtafit Essentials Long for bilateral lower legs; quantity three juxtafit essentials per limb. Wound  Treatment Wound #6 - Lower Leg Wound Laterality: Right, Medial Cleanser: Soap and Water 1 x Per Day/30 Days Discharge Instructions: May shower and wash wound with dial antibacterial soap and water prior to dressing change. Peri-Wound Care: Sween Lotion (Moisturizing lotion) 1 x Per Day/30 Days Discharge Instructions: Apply moisturizing lotion as directed Prim Dressing: Maxorb Extra Ag+ Alginate Dressing, 4x4.75 (in/in) 1 x Per Day/30 Days ary Discharge Instructions: Apply to wound bed as instructed Prim Dressing: Compounding topical antibiotics- Alpha 1 x Per Day/30 Days ary Discharge Instructions: apply under alignate Ag when patient brings with appts. Secondary Dressing: ABD Pad, 5x9 1 x Per Day/30 Days Discharge Instructions: Apply over primary dressing as directed. Secondary Dressing: Woven Gauze Sponge, Non-Sterile 4x4 in 1 x Per Day/30 Days Discharge Instructions: Apply over primary dressing as directed. Secured With: The [employer], 4.5x3.1 (in/yd) 1 x Per Day/30 Days Discharge Instructions: Secure with Kerlix as directed. Secured With: 44M Medipore H Soft Cloth Surgical T ape, 4 x 10 (in/yd) 1 x Per Day/30 Days Discharge Instructions: Secure with tape as directed. Secured With: ace wrap 1 x Per Day/30 Days Discharge Instructions: apply to legs until compression garments arrive Wound #7 - Lower Leg Wound Laterality: Left, Circumferential Cleanser: Soap and Water 1 x Per Day/30 Days Discharge Instructions: May shower and wash wound with dial antibacterial soap and water prior to dressing change. Peri-Wound Care: Sween Lotion (Moisturizing lotion) 1 x Per Day/30 Days Discharge Instructions: Apply moisturizing lotion as directed Prim Dressing: Maxorb Extra Ag+ Alginate Dressing, 4x4.75 (in/in) 1 x Per Day/30 Days ary Discharge Instructions: Apply to wound bed as instructed Prim Dressing: Compounding topical antibiotics- [place] 1 x Per Day/30  Days ary Discharge Instructions: apply under alignate Ag when patient brings with appts. Secondary Dressing: ABD Pad, 5x9 1 x Per Day/30 Days [place]ford, [name] [name] ([id number] Page 6 of 11 Discharge Instructions: Apply over primary dressing as directed. Secondary Dressing: Woven Gauze Sponge, Non-Sterile 4x4 in 1 x Per Day/30 Days Discharge Instructions: Apply over primary dressing as directed. Secured With: [employer], 4.5x3.1 (in/yd) 1 x Per Day/30 Days Discharge Instructions: Secure with Kerlix as directed. Secured With: 44M Medipore H Soft Cloth Surgical T ape, 4 x 10 (in/yd) 1 x Per Day/30 Days Discharge Instructions: Secure with tape as directed. Compression Wrap: Ace wrap 1 x Per Day/30 Days Discharge Instructions: apply to legs until compression garments arrive. Electronic Signature(s) Signed: 08/06/2022 5:04:23 PM By: [name] Pilling RN, [name] Signed: 08/06/2022 6:05:31 PM By: [name] Keeler PA-C Entered By: [name] [name] on 08/06/2022 14:02:27 -------------------------------------------------------------------------------- Problem List Details Patient Name: Date of  Service: [name] [name] 08/06/2022 12:45 PM Medical Record Number: [id number] Patient Account Number: [id number] Date of Birth/Sex: Treating RN: 04-17-1946 (77 y.o. M) Primary Care Provider: Birdie [name] Other Clinician: Referring Provider: Treating Provider/Extender: [name] [name] in Treatment: 19 Active Problems ICD-10 Encounter Code Description Active Date MDM Diagnosis I87.333 Chronic venous hypertension (idiopathic) with ulcer and inflammation of 06/11/2022 No Yes bilateral lower extremity I89.0 Lymphedema, not elsewhere classified 03/26/2022 No Yes L97.828 Non-pressure chronic ulcer of other part of left lower leg with other specified 03/26/2022 No Yes severity 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 other part of right lower leg with other  specified 03/26/2022 No Yes severity Inactive Problems Resolved Problems Electronic Signature(s) Signed: 08/06/2022 1:41:02 PM By: [name] Keeler PA-C Entered By: [name] [name] on 08/06/2022 13:41:01 -------------------------------------------------------------------------------- Progress Note Details Patient Name: Date of Service: [name] [name] 08/06/2022 12:45 PM Medical Record Number: [id number] Patient Account Number: [id number] Date of Birth/Sex: Treating RN: 1946/03/18 (77 y.o. M) Gathers, [name] [name] ([id number] Page 7 of 11 Primary Care Provider: Birdie [name] Other Clinician: Referring Provider: Treating Provider/Extender: [name] [name] in Treatment: 93 Subjective Chief Complaint Information obtained from Patient 02/23/2020; patient is here for wounds on his bilateral lower legs in the setting of severe lymphedema 03/26/2022; patient is here for wounds on his bilateral lower legs medial aspect History of Present Illness (HPI) ADMISSION 02/23/2020 Patient is a 77 year old man who lives in [place] who arrives accompanied by his wife. He has a history of chronic lymphedema and venous insufficiency in his bilateral lower legs which may have something to do that with having a history of DVT as well as being treated for prostate cancer. In any case he recently got compression pumps at home but compliance has been an issue here. He has compression stockings however they are probably not sufficient enough to control swelling. They tell us that things deteriorated for him in late August he was admitted to [facility] for 7 days. This was with cellulitis I think of his bilateral lower legs. Discharge he was noted to have wounds on his bilateral lower legs. He was discharged on Bactrim. They tried to get him home health through [facility] of course they declined him. His wife is been wrapping these  applying some form of silver foam dressing. He has a history of wounds before although nothing that would not heal with basic home topical dressings. He has 2 areas on the left medial, left anterior and left lateral and a smaller area on the right medial. All of these have considerable depth. Past medical history includes iron deficiency anemia, lymphedema followed by the rehab center at [facility] with lymphedema wraps I believe, DVT on chronic anticoagulation, prostate cancer, chronic venous insufficiency, hypertension. As mentioned he has compression pumps but does not use them. ABIs in our clinic were noncompressible bilaterally 10/14; patient with severe bilateral lymphedema right greater than left. He came in with bilateral lower extremity wounds left greater than right. Even though the right side has more of the edema most of the wounds here almost closed on the right medial. He has 3 remaining wounds on the left We have been using silver alginate under 4-layer compression I have been trying to get him to be compliant with his external compression pumps 10/21; patient with 3 small wounds on the left leg and 1 on the right medial in the setting of severe lymphedema  and chronic venous insufficiency. We have been using silver alginate under 4-layer compression he is using his external compression pumps twice a day 11/4; ARTERIAL STUDIES on the right show an ABI of 1.02 TBI of 0.858 with biphasic waveforms on the left 0.98 with a TBI of 0.55 and biphasic waveforms. Does not look like he has significant arterial disease. We are treating him for lymphedema he has compression pumps. He has punched-out areas on the left anterior left lateral and right medial lower extremities 11/11; after we obtained his arterial studies I put him in 4 layer compression. He is using his compression pumps probably once a day although I have asked him to do twice. Primary dressing to the wound is silver collagen he has  severe lymphedema likely secondary to chronic venous insufficiency. Wounds on the left lateral, left medial and left anterior and a small area on the right medial 12/2; the area on the right anterior lower leg has healed. We initially thought that the area medially had healed as well however when her discharge nurse came in she detected fluid in the wound simply opened up. This is actually worse than I remember this pain. The area on the left lateral potentially slightly smaller He is also complaining about pain in his left hand he says that this is actually been getting some better he has been using topical creams on this. She asked that I look at this 12/9 after last [name] issues we have 2 wounds one on the right medial lower leg and 1 on the left lateral. Both of these are in the same condition. I think because of thickened skin secondary to chronic lymphedema these wounds actually have depth of almost 0.8 cm. 12/16; the patient has 2 small but deep wounds one on the right medial and one on the left lateral. The right medial is actually the worst of these. He arrives in clinic today with absolutely terrible edema in the right leg apparently his 4-layer wrap fell down to just above his ankle he did not think about this he is apparently been continuing to use his compression pump twice a day. The left leg looks a lot better. 05/09/2020 upon evaluation today patient appears to be doing decently well in regard to his wounds. Everything is measuring smaller the right leg still has a little bit deeper wound in the left seems to be almost completely healed in my opinion I am very pleased in general with how things are progressing. He has a 4- layer compression wrap we have been using endoform today we will probably have to use collagen just based on the fact that we do not have endoform it is on order. 1/6; the patient's wound on the left lateral lower leg has healed. Still has 1 on the right medial. He  has severe bilateral lymphedema right greater than left. Using compression pumps at home twice a day. 1/13; left lateral lower leg is still healed. He has a deep punched out rectangular shaped wound on the right medial calf. Looking down at this it appears that he is attempting to epithelialize around the edges of the wound and on the base as well. His edema is reasonably well controlled we have been using collagen with absolutely no effect 1/20; left lateral lower leg remains closed he has extremitease stockings. The area on the right medial calf I aggressively debrided last week measures larger but the surface looks better. We have been using Hydrofera Blue. We ran Oasis through  his insurance but we have not seen the results of this 1/27; left lower leg wound with chronic venous insufficiency and secondary lymphedema. I did aggressive debridement on this last week the wound seems to have come in healthy looking surface using Hydrofera Blue. He was denied for Oasis 2/3; small divot in the right medial lower leg. Under illumination the walls of this divot are epithelialized however the base has slough which I removed with a curette we have been using Hydrofera Blue 2/10 small divot on the right medial lower leg pinpoint illumination at the base of this cone-shaped wound. We have been using Hydrofera Blue but I will switch to calcium alginate this week 2/17; the small divot on the right medial lower leg is fully epithelialized. There is no visible open area under illumination. He has his own stocking for the right leg similar to the one he has been wearing on the left. 03/26/2022; READMISSION This is a now 77 year old man that we had in the clinic from 02/23/2020 through 07/05/2020. At that point he had bilateral lower extremity wounds left greater than right in the setting of severe lymphedema. He had already obtained compression pumps ordered for him I think from the wound care clinic in [facility] so I do not really have record of what he has been using. He claims to be using them once a day but there is a problem with the sleeve on the left leg. About 2 [name] ago he was hospitalized from 03/11/2022 through [id number] with diastolic congestive heart failure. 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 He was diuresed. Developed some prerenal azotemia and he has not been taking any diuretics currently. He has not been putting stockings on his legs since he got out of hospital and still has his legs dependent for long periods. Loomer, [name] [name] ([id number] Page 8 of 11 Past medical history history of prostate cancer treated with prostatectomy and radiation this was apparently about 8 years ago, history of DVT on chronic Coumadin, history of lymphedema was managed for a while at the clinic in [place]. History of inguinal hernia repair in September 22, hypertension, stage IIIb chronic renal failure ABIs today were noncompressible on the right 1.12 on the left 04-02-2022 upon evaluation today patient appears to be doing well currently in regard to his legs I do feel like both areas that are draining are actually much drier than they were in the picture last week although the left is drier than the right. He is tolerating the 4-layer compression wraps at this point he did contact the pump company and they are actually working on getting him a new compression sleeve for one of his legs which have previously popped and was not functioning properly. 04-23-2022 upon evaluation today patient appears to be doing well currently in regard to his wounds on the legs. I am actually very pleased with where things stand and I do feel like that we are headed in the right direction. Fortunately there is no sign of active infection locally or systemically at this time. 05-07-2022 upon evaluation today patient appears  to be doing well currently in regard to his wounds in fact things are showing signs of improvement which is good news I do not see too much that actually appears to be open and I am very pleased in that regard. No fevers, chills, nausea, vomiting, or diarrhea. 05-21-2022 upon evaluation today patient appears to be doing somewhat  poorly in regard to drainage of his lower extremities bilaterally. The right is greater than left as far as the weeping area. Nonetheless it seems to be getting worse not better. He actually has pitting edema which is at least 2+ to the thighs and I am concerned about the fact that he is may be fluid overloaded in general and that is the reason why we cannot get this under control. I know he is not using his pumps all the time because he actually told the nurse that he was either going to pump or he was going to use his fluid pills but not do both. For that reason I do think that he needs to be really doing both in order to get the fluid out as effectively as possible obviously with the 4-layer compression wraps were doing as much as we can from a compression standpoint but it is really not enough. He tells me that he elevates his leg is much as he can in between pumping and other activity throughout the day. 05-28-2022 upon evaluation today patient appears to be doing better in regard to his wounds although the measurements may be a little bit larger this is a very difficult wound to heal it is very indistinct in a lot of areas. Nonetheless there is can be some need for sharp debridement in regard to both medial and lateral legs. Fortunately I see no signs of active infection locally nor systemically at this time. No fevers, chills, nausea, vomiting, or diarrhea. 06-04-2022 upon evaluation today patient appears to be doing poorly in general in regard to the wounds on his legs. He still continues to have a tremendous amount of fluid not just in the lower portion of his leg but to be  honest his thigh where he has 2-3+ pitting edema in the thigh as well. Unfortunately I do not know that we will be able to get this healed effectively and keep it healed on the lower extremities unless he gets the overall fluid situation taking and under control. Fortunately I do not see any signs of infection locally nor systemically which is great news. He just seems to be very fluid overloaded. 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 He has been tolerating the dressing changes with the compression wraps without complication. Fortunately I do not see any evidence of infection at this time which is great news. No fevers, chills, nausea, vomiting, or diarrhea. 06-18-2022 upon evaluation today patient appears to be doing well currently in regard to his wounds as far as not looking like they are terribly infected but nonetheless I am concerned about a subacute infection secondary to the fact that he continues to have spreading despite the compression therapy. We actually did do an Unna boot on him last week this is actually the first wrap that actually stayed up everything else has been sliding down quite significantly. Fortunately there does not appear to be any signs of infection systemically at this time. With that being said I do believe that locally there seems to be an issue going on here and again I Georgina Peer do a PCR culture to see what that shows also think that I am going to put him on a broad-spectrum antibiotic, doxycycline to see how that will help as well. He does tell me that coming into the clinic today that he was feeling short of breath like "he was about to have a heart  attack" because he was having such a hard time breathing. He says that he told this to Dr. Debbora Dus his cardiologist as well when he was evaluated in the past 1 to 2 [name]. 07-02-2022 upon evaluation today  patient appears to be doing poorly currently in regard to his wound. He has been tolerating the dressing changes. Unfortunately he has not had any compression wraps on for the past week because he was unable to make it in for his appointment last week. With that being said he has a significant amount of drainage he tells me has been using his pumps but despite this in the pumps he still has been draining quite a bit. The drainage is also somewhat purulent unfortunately. We did attempt to get in touch with his cardiologist last week unfortunately we were unable to get up with him I did advise that the patient needs to get in touch with him upon leaving today in order to make sure they know he is on the new antibiotics I am going to send him this will be Levaquin and Augmentin. 07-09-2022 upon evaluation today patient appears to be doing about the same in regard to his legs he may have just a slight amount of improvement with regard to the drainage probably Keystone topical antibiotics are helping in this regard to some degree. Fortunately there does not appear to be any signs of active infection systemically which is great news. No fevers, chills, nausea, vomiting, or diarrhea. 07-16-2022 upon evaluation today patient appears to be doing well currently in regard to his wound. He has been tolerating the dressing changes without complication. Fortunately there does not appear to be any signs of active infection locally nor systemically at this time. With that being said he cannot keep the wraps up he tells me on the left side he had to cut this down because it got too tight. He has been using his pumps but he is on the right side the wrap actually straight down causing some pushing around the central part of his leg just below the calf I think this is a bigger risk for him that help at this point. I think that we may need to try something different he should be getting his compression socks shortly he tells  me they were ordered last Thursday. 3/6; ; this is a patient who lives in [place]. He has severe bilateral lymphedema. He has compression pumps, we have been using kerlix Ace wrap Keystone. He is changing the dressing. We do not have home health. 08-06-2022 upon evaluation today patient appears to be doing a little better in regard to his wounds in general at this point. Fortunately there does not appear to be any signs of active infection locally nor systemically at this time which is great news and overall I am extremely pleased with where we stand today. Objective Constitutional Well-nourished and well-hydrated in no acute distress. Vitals Time Taken: 1:40 PM, Height: 74 in, Weight: 250 lbs, BMI: 32.1, Temperature: 97.9 F, Pulse: 97 bpm, Respiratory Rate: 20 breaths/min, Blood Pressure: 192/85 mmHg. Respiratory normal breathing without difficulty. Psychiatric Herrada, [name] [name] ([id number]) [phone].pdf Page 9 of 11 this patient is able to make decisions and demonstrates good insight into disease process. Alert and Oriented x 3. pleasant and cooperative. General Notes: Upon inspection patient's wounds did require sharp debridement I did perform debridement today to clearway the necrotic debris he tolerated that without complication postdebridement the wound bed is significantly improved which is great news overall I  am extremely pleased with where we stand today. Integumentary (Hair, Skin) Wound #6 status is Open. Original cause of wound was Gradually Appeared. The date acquired was: 03/05/2022. The wound has been in treatment 19 [name]. The wound is located on the Right,Medial Lower Leg. The wound measures 14cm length x 19cm width x 0.1cm depth; 208.916cm^2 area and 20.892cm^3 volume. There is Fat Layer (Subcutaneous Tissue) exposed. There is no tunneling or undermining noted. There is a large amount of serosanguineous drainage noted. The wound margin is distinct  with the outline attached to the wound base. There is no granulation within the wound bed. There is a large (67-100%) amount of necrotic tissue within the wound bed including Adherent Slough. The periwound skin appearance exhibited: Scarring, Hemosiderin Staining. The periwound skin appearance did not exhibit: Callus, Crepitus, Excoriation, Induration, Rash, Dry/Scaly, Maceration, Atrophie Blanche, Cyanosis, Ecchymosis, Mottled, Pallor, Rubor, Erythema. Wound #7 status is Open. Original cause of wound was Gradually Appeared. The date acquired was: 03/05/2022. The wound has been in treatment 19 [name]. The wound is located on the Left,Circumferential Lower Leg. The wound measures 14cm length x 39cm width x 0.1cm depth; 428.827cm^2 area and 42.883cm^3 volume. There is Fat Layer (Subcutaneous Tissue) exposed. There is no tunneling or undermining noted. There is a medium amount of serosanguineous drainage noted. The wound margin is distinct with the outline attached to the wound base. There is a large (67-100%) amount of necrotic tissue within the wound bed including Adherent Slough. The periwound skin appearance exhibited: Scarring, Maceration, Hemosiderin Staining. The periwound skin appearance did not exhibit: Callus, Crepitus, Excoriation, Induration, Rash, Dry/Scaly, Atrophie Blanche, Cyanosis, Ecchymosis, Mottled, Pallor, Rubor, Erythema. 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 There was a Excisional Skin/Subcutaneous Tissue Debridement with a total area of 72 sq cm performed by  [name] Keeler, PA. With the following instrument(s): Curette to remove Viable and Non-Viable tissue/material. Material removed includes Subcutaneous Tissue, Slough, and Biofilm after achieving pain control using Lidocaine 4% T opical Solution. A time out was conducted at 01:50, prior to the start of the procedure. A Large amount of bleeding was controlled with Pressure. The procedure was tolerated well with a pain level of 0 throughout. Post Debridement Measurements: 14cm length x 19cm width x 0.1cm depth; 20.892cm^3 volume. Character of Wound/Ulcer Post Debridement is improved. Post procedure Diagnosis Wound #6: Same as Pre-Procedure Wound #7 Pre-procedure diagnosis of Wound #7 is a Lymphedema located on the Left,Circumferential Lower Leg . There was a Excisional Skin/Subcutaneous Tissue Debridement with a total area of 25 sq cm performed by [name] Keeler, PA. With the following instrument(s): Curette to remove Viable and Non-Viable tissue/material. Material removed includes Subcutaneous Tissue, Slough, and Biofilm after achieving pain control using Lidocaine 4% T opical Solution. A time out was conducted at 01:50, prior to the start of the procedure. A Large amount of bleeding was controlled with Pressure. The procedure was tolerated well with a pain level of 0 throughout. Post Debridement Measurements: 14cm length x 39cm width x 0.1cm depth; 42.883cm^3 volume. Character of Wound/Ulcer Post Debridement is improved. Post procedure Diagnosis Wound #7: Same as Pre-Procedure Plan Follow-up Appointments: Return Appointment in  1 week. Margarita Grizzle Wednesday 08/13/2022 1245 Return Appointment in 2 [name]. Margarita Grizzle Wednesday 08/20/2022 1230 Other: - ***Change daily. ***** Anesthetic: (In clinic) Topical Lidocaine 5% applied to wound bed Bathing/ Shower/ Hygiene: May shower with protection but do not get wound dressing(s) wet. Protect dressing(s) with water repellant cover (for example, large plastic bag) or  a cast cover and may then take shower. Edema Control - Lymphedema / SCD / Other: Lymphedema Pumps. Use Lymphedema pumps on leg(s) 2-3 times a day for 45-60 minutes. If wearing any wraps or hose, do not remove them. Continue exercising as instructed. - *****pump 3 times a day for an hour each time.***** Elevate legs to the level of the heart or above for 30 minutes daily and/or when sitting for 3-4 times a day throughout the day. - important Avoid standing for long periods of time. Exercise regularly Compression stocking or Garment 30-40 mm/Hg pressure to: - juxtafit Essentials Long for bilateral lower legs; quantity three juxtafit essentials per limb. WOUND #6: - Lower Leg Wound Laterality: Right, Medial Cleanser: Soap and Water 1 x Per Day/30 Days Discharge Instructions: May shower and wash wound with dial antibacterial soap and water prior to dressing change. Soto, [name] [name] ([id number].pdf Page 10 of 11 Peri-Wound Care: Sween Lotion (Moisturizing lotion) 1 x Per Day/30 Days Discharge Instructions: Apply moisturizing lotion as directed Prim Dressing: Maxorb Extra Ag+ Alginate Dressing, 4x4.75 (in/in) 1 x Per Day/30 Days ary Discharge Instructions: Apply to wound bed as instructed Prim Dressing: Compounding topical antibiotics- [place] 1 x Per Day/30 Days ary Discharge Instructions: apply under alignate Ag when patient brings with appts. Secondary Dressing: ABD Pad, 5x9 1 x Per Day/30 Days Discharge Instructions: Apply over primary dressing as directed. Secondary Dressing: Woven Gauze Sponge, Non-Sterile 4x4 in 1 x Per Day/30 Days Discharge Instructions: Apply over primary dressing as directed. Secured With: [employer], 4.5x3.1 (in/yd) 1 x Per Day/30 Days Discharge Instructions: Secure with Kerlix as directed. Secured With: 80M Medipore H Soft Cloth Surgical T ape, 4 x 10 (in/yd) 1 x Per Day/30 Days Discharge Instructions: Secure with  tape as directed. Secured With: ace wrap 1 x Per Day/30 Days Discharge Instructions: apply to legs until compression garments arrive WOUND #7: - Lower Leg Wound Laterality: Left, Circumferential Cleanser: Soap and Water 1 x Per Day/30 Days Discharge Instructions: May shower and wash wound with dial antibacterial soap and water prior to dressing change. Peri-Wound Care: Sween Lotion (Moisturizing lotion) 1 x Per Day/30 Days Discharge Instructions: Apply moisturizing lotion as directed Prim Dressing: Maxorb Extra Ag+ Alginate Dressing, 4x4.75 (in/in) 1 x Per Day/30 Days ary Discharge Instructions: Apply to wound bed as instructed Prim Dressing: Compounding topical antibiotics- [place] 1 x Per Day/30 Days ary Discharge Instructions: apply under alignate Ag when patient brings with appts. Secondary Dressing: ABD Pad, 5x9 1 x Per Day/30 Days Discharge Instructions: Apply over primary dressing as directed. Secondary Dressing: Woven Gauze Sponge, Non-Sterile 4x4 in 1 x Per Day/30 Days Discharge Instructions: Apply over primary dressing as directed. Secured With: [employer], 4.5x3.1 (in/yd) 1 x Per Day/30 Days Discharge Instructions: Secure with Kerlix as directed. Secured With: 80M Medipore H Soft Cloth Surgical T ape, 4 x 10 (in/yd) 1 x Per Day/30 Days Discharge Instructions: Secure with tape as directed. Com pression Wrap: Ace wrap 1 x Per Day/30 Days Discharge Instructions: apply to legs until compression garments arrive. 1. I am good recommend currently that we have the patient continue to monitor  for any signs of infection or worsening. Based on what I am seeing at this point I do believe that he is making some pretty good progress here. 2. I am also can recommend the patient should continue to elevate his legs much as possible he should also be using his juxta fit compression wraps which I think will also be beneficial for him. We will see patient back for reevaluation in  1 week here in the clinic. If anything worsens or changes patient will contact our office for additional recommendations. Electronic Signature(s) Signed: 08/06/2022 5:57:33 PM By: [name] Keeler PA-C Entered By: [name] [name] on 08/06/2022 17:57:33 -------------------------------------------------------------------------------- SuperBill Details Patient Name: Date of Service: [name] [name] 08/06/2022 Medical Record Number: [id number] Patient Account Number: [id number] Date of Birth/Sex: Treating RN: Dec 30, 1945 (77 y.o. 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 Facility Procedures : CPT4 Code: [id number] Description: [id number] - DEB SUBQ TISSUE 20 SQ CM/< ICD-10 Diagnosis Description L97.828 Non-pressure chronic ulcer of other part of left lower leg with other specified 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 other part of right lower leg with other specified Modifier: severity severity Quantity: 1 : [place] CPT4 Code: [id number] , Jontae ([id number] Description: [id number] - DEB SUBQ TISS EA ADDL 20CM [id number].pdf Pag Quantity: 4 e 11 of 11 : CPT4 Code: I Description: CD-10 Diagnosis Description 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 other part of right lower leg with other specified seve L97.828 Non-pressure chronic ulcer of other part of left lower leg with other specified sever Modifier: rity ity Quantity: Physician Procedures :  CPT4 Code Description Modifier [id number] 11042 - WC PHYS SUBQ TISS 20 SQ CM ICD-10 Diagnosis Description 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 Quantity: 1 : [id number] 11045 - WC PHYS SUBQ TISS EA ADDL 20 CM ICD-10 Diagnosis Description 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 other part of right lower leg with other specified severity L97.828 Non-pressure chronic ulcer of other part of left lower leg with other  specified severity Quantity: 4 Electronic Signature(s) Signed: 08/06/2022 5:57:41 PM By: [name] Keeler PA-C Previous Signature: 08/06/2022 5:04:23 PM Version By: [name] Pilling RN, [name] Entered By: [name] [name] on 08/06/2022 17:57:41

## 2022-08-07 NOTE — Progress Notes (Signed)
Schermerhorn, [name] [name] ([id number].pdf Page 1 of 7 Visit Report for 08/06/2022 Arrival Information Details Patient Name: Date of Service: [name] [name] 08/06/2022 12:45 PM Medical Record Number: [id number] Patient Account Number: [id number] Date of Birth/Sex: Treating RN: [birth year] (77 y.o. [name] [name] Primary Care [name] [name]: [name] [name] Other Clinician: Referring [name] [name]: Treating [name] [name]/Extender: [name] [name] in Treatment: 31 Visit Information History Since Last Visit Added or deleted any medications: No Patient Arrived: Cane Any new allergies or adverse reactions: No Arrival Time: 13:31 Had a fall or experienced change in No Accompanied By: self activities of daily living that may affect Transfer Assistance: None risk of falls: Patient Identification Verified: Yes Signs or symptoms of abuse/neglect since last visito No Secondary Verification Process Completed: Yes Hospitalized since last visit: No Patient Requires Transmission-Based Precautions: No Implantable device outside of the clinic excluding No Patient Has Alerts: Yes cellular tissue based products placed in the center Patient Alerts: Patient on Blood Thinner since last visit: Right ABI in clinic [place] Has Dressing in Place as Prescribed: Yes Has Compression in Place as Prescribed: Yes Pain Present Now: No Electronic Signature(s) Signed: 08/06/2022 5:04:23 PM By: [name] Pilling RN, [name] Entered By: [name] [name] on 08/06/2022 13:31:50 -------------------------------------------------------------------------------- Encounter Discharge Information Details Patient Name: Date of Service: [name] [name] 08/06/2022 12:45 PM Medical Record Number: [id number] Patient Account Number: [id number] Date of Birth/Sex: Treating RN: [name] (76 y.o. [name] [name] Primary Care [name] [name]: [name] [name] Other Clinician: Referring [name] [name]: Treating  [name] [name]/Extender: [name] [name] in Treatment: 32 Encounter Discharge Information Items Post Procedure Vitals Discharge Condition: Stable Temperature (F): 97.9 Ambulatory Status: Cane Pulse (bpm): 97 Discharge Destination: Home Respiratory Rate (breaths/min): 20 Transportation: Private Auto Blood Pressure (mmHg): 192/85 Accompanied By: self Schedule Follow-up Appointment: Yes Clinical Summary of Care: Electronic Signature(s) Signed: 08/06/2022 5:04:23 PM By: [name] Pilling RN, [name] Entered By: [name] [name] on 08/06/2022 16:21:41 -------------------------------------------------------------------------------- Lower Extremity Assessment Details Patient Name: Date of Service: [name] [name] [name] 08/06/2022 12:45 PM Medical Record Number: [id number] Patient Account Number: [id number] Date of Birth/Sex: Treating RN: [name] (77 y.o. [name] [name] Primary Care Marcea Rojek: [name] [name] Other Clinician: Referring [name] [name]: Treating [name] [name]/Extender: [name] [name], [name] [name] in Treatment: 19 Edema Assessment J[Left: [name], [name] [name] [id number] Patrice [id number].pdf Page 2 of 7] Assessed: [Left: No] [Right: No] Edema: [Left: Yes] [Right: Yes] Calf Left: Right: Point of Measurement: 40 cm From Medial Instep 55.3 cm 62 cm Ankle Left: Right: Point of Measurement: 13 cm From Medial Instep 48 cm 41 cm Electronic Signature(s) Signed: 08/06/2022 5:04:23 PM By: [name] Pilling RN, [name] Entered By: [name] [name] on 08/06/2022 13:35:01 -------------------------------------------------------------------------------- [place] Details Patient Name: Date of Service: [name] [name] 08/06/2022 12:45 PM Medical Record Number: [id number] Patient Account Number: [id number] Date of Birth/Sex: Treating RN: [name] (77 y.o. [name] [name] Primary Care Gizella [place]: [name] [name] Other Clinician: Referring  [name] [name]: Treating [name]/Extender: [name] [name] [name] in Treatment: 19 Active Inactive Wound/Skin Impairment Nursing Diagnoses: Impaired tissue integrity Knowledge deficit related to ulceration/compromised skin integrity Goals: Patient will have a decrease in wound volume by X% from date: (specify in notes) Date Initiated: 03/26/2022 Target Resolution Date: 09/19/2022 Goal Status: Active Patient/caregiver will verbalize understanding of skin care regimen Date Initiated: 03/26/2022 Target Resolution Date: 09/20/2022 Goal Status: Active Ulcer/skin breakdown will have a volume reduction of 30% by week 4 Date Initiated: 03/26/2022 Date Inactivated: 05/21/2022 Target Resolution Date:  05/17/2022 Unmet Reason: see wound Goal Status: Unmet measurement. Ulcer/skin breakdown will have a volume reduction of 50% by week 8 Date Initiated: 03/26/2022 Date Inactivated: 05/21/2022 Target Resolution Date: 05/17/2022 Unmet Reason: see wound Goal Status: Unmet measurement. Interventions: Assess patient/caregiver ability to obtain necessary supplies Assess patient/caregiver ability to perform ulcer/skin care regimen upon admission and as needed Assess ulceration(s) every visit Notes: Patient stated today, "I will take my fluid pill or pump not do both." [name] [name] made aware. Electronic Signature(s) Signed: 08/06/2022 5:04:23 PM By: [name] Pilling RN, [name] Entered By: [name] [name] on 08/06/2022 13:41:35 -------------------------------------------------------------------------------- Pain Assessment Details Patient Name: Date of Service: [name] [name] [name] 08/06/2022 12:45 PM Medical Record Number: [id number] Patient Account Number: [id number] Date of Birth/Sex: Treating RN: [name] [name] (77 y.o. [name] [name], [name] [name], [name] [name] ([id number]) [id number].pdf Page 3 of 7 Primary Care Lydiah Pong: [name] [name] Other Clinician: Referring Clelia Trabucco: Treating  Adrienna Karis/Extender: [name] [name] in Treatment: 19 Active Problems Location of Pain Severity and Description of Pain Patient Has Paino No Site Locations Pain Management and Medication Current Pain Management: Medication: No Cold Application: No Rest: No Massage: No Activity: No T.E.N.S.: No Heat Application: No Leg drop or elevation: No Is the Current Pain Management Adequate: Adequate How does your wound impact your activities of daily livingo Sleep: No Bathing: No Appetite: No Relationship With Others: No Bladder Continence: No Emotions: No Bowel Continence: No Work: No Toileting: No Drive: No Dressing: No Hobbies: No Electronic Signature(s) Signed: 08/06/2022 5:04:23 PM By: [name] Pilling RN, [name] Entered By: [name] [name] on 08/06/2022 13:32:01 -------------------------------------------------------------------------------- Patient/Caregiver Education Details Patient Name: Date of Service: [name] [name] [name] 3/20/2024andnbsp12:45 PM Medical Record Number: [id number] Patient Account Number: [id number] Date of Birth/Gender: Treating RN: [name] (77 y.o. [name] [name] Primary Care Physician: [name] [name] Other Clinician: Referring Physician: Treating Physician/Extender: [name] [name] in Treatment: 53 Education Assessment Education Provided To: Patient Education Topics Provided Wound/Skin Impairment: Handouts: Caring for Your Ulcer Methods: Explain/Verbal Responses: State content correctly Madonna, [name] [name] ([id number]) [phone].pdf Page 4 of 7 Electronic Signature(s) Signed: 08/06/2022 5:04:23 PM By: [name] Pilling RN, [name] Entered By: [name] [name] on 08/06/2022 13:41:51 -------------------------------------------------------------------------------- Wound Assessment Details Patient Name: Date of Service: [name] [name] [name] 08/06/2022 12:45 PM Medical Record Number: [id number] Patient  Account Number: [id number] Date of Birth/Sex: Treating RN: [name] (77 y.o. [name] [name] Primary Care Leodan Bolyard: [name] [name] Other Clinician: Referring Kristopher Attwood: Treating Carolyna Yerian/Extender: [name] [name] [name] in Treatment: 19 Wound Status Wound Number: 6 Primary Lymphedema Etiology: Wound Location: Right, Medial Lower Leg Wound Open Wounding Event: Gradually Appeared Status: Date Acquired: 03/05/2022 Comorbid Anemia, Lymphedema, Deep Vein Thrombosis, Hypertension, [name] Of Treatment: 19 History: Received Radiation Clustered Wound: Yes Photos Wound Measurements Length: (cm) 14 Width: (cm) 19 Depth: (cm) 0.1 Clustered Quantity: 2 Area: (cm) 208.916 Volume: (cm) 20.892 % Reduction in Area: -46.2% % Reduction in Volume: -46.2% Epithelialization: Small (1-33%) Tunneling: No Undermining: No Wound Description Classification: Full Thickness Without Exposed Sup Wound Margin: Distinct, outline attached Exudate Amount: Large Exudate Type: Serosanguineous Exudate Color: red, brown port Structures Foul Odor After Cleansing: No Slough/Fibrino Yes Wound Bed Granulation Amount: None Present (0%) Exposed Structure Necrotic Amount: Large (67-100%) Fascia Exposed: No Necrotic Quality: Adherent Slough Fat Layer (Subcutaneous Tissue) Exposed: Yes Tendon Exposed: No Muscle Exposed: No Joint Exposed: No Bone Exposed: No Periwound Skin Texture Texture Color No Abnormalities Noted: No No Abnormalities Noted: No Callus: No Atrophie Blanche: No Crepitus:  No Cyanosis: No Excoriation: No Ecchymosis: No Induration: No Erythema: No Rash: No Hemosiderin Staining: Yes Scarring: Yes Mottled: No Pallor: No Moisture Rubor: No No Abnormalities Noted: No Robson, Shannen ([id number].pdf Page 5 of 7 Dry / Scaly: No Maceration: No Treatment Notes Wound #6 (Lower Leg) Wound Laterality: Right, Medial Cleanser Soap and  Water Discharge Instruction: May shower and wash wound with dial antibacterial soap and water prior to dressing change. Peri-Wound Care Sween Lotion (Moisturizing lotion) Discharge Instruction: Apply moisturizing lotion as directed Topical Primary Dressing Maxorb Extra Ag+ Alginate Dressing, 4x4.75 (in/in) Discharge Instruction: Apply to wound bed as instructed Compounding topical antibiotics- [facility] Discharge Instruction: apply under alignate Ag when patient brings with appts. Secondary Dressing ABD Pad, 5x9 Discharge Instruction: Apply over primary dressing as directed. Woven Gauze Sponge, Non-Sterile 4x4 in Discharge Instruction: Apply over primary dressing as directed. Secured With [employer], 4.5x3.1 (in/yd) Discharge Instruction: Secure with Kerlix as directed. 4M Medipore H Soft Cloth Surgical T ape, 4 x 10 (in/yd) Discharge Instruction: Secure with tape as directed. ace wrap Discharge Instruction: apply to legs until compression garments arrive Compression Wrap Compression Stockings Add-Ons Electronic Signature(s) Signed: 08/06/2022 5:04:23 PM By: [name] Pilling RN, [name] Entered By: [name] [name] on 08/06/2022 13:40:35 -------------------------------------------------------------------------------- Wound Assessment Details Patient Name: Date of Service: [name] [name] [name] 08/06/2022 12:45 PM Medical Record Number: [id number] Patient Account Number: [id number] Date of Birth/Sex: Treating RN: [birth year] (77 y.o. [name] [name] Primary Care Lara Palinkas: [name] [name] Other Clinician: Referring Chelisa Hennen: Treating Sparrow [place]/Extender: [name] [name] [name] in Treatment: 19 Wound Status Wound Number: 7 Primary Lymphedema Etiology: Wound Location: Left, Circumferential Lower Leg Wound Open Wounding Event: Gradually Appeared Status: Date Acquired: 03/05/2022 Comorbid Anemia, Lymphedema, Deep Vein Thrombosis, Hypertension, [name] Of  Treatment: 19 History: Received Radiation Clustered Wound: Yes Photos [name] [name] [name] ([id number].pdf Page 6 of 7 Wound Measurements Length: (cm) 14 Width: (cm) 39 Depth: (cm) 0.1 Clustered Quantity: 4 Area: (cm) 428.827 Volume: (cm) 42.883 % Reduction in Area: -810% % Reduction in Volume: -810.1% Epithelialization: None Tunneling: No Undermining: No Wound Description Classification: Full Thickness Without Exposed Sup Wound Margin: Distinct, outline attached Exudate Amount: Medium Exudate Type: Serosanguineous Exudate Color: red, brown port Structures Foul Odor After Cleansing: No Slough/Fibrino Yes Wound Bed Necrotic Amount: Large (67-100%) Exposed Structure Necrotic Quality: Adherent Slough Fascia Exposed: No Fat Layer (Subcutaneous Tissue) Exposed: Yes Tendon Exposed: No Muscle Exposed: No Joint Exposed: No Bone Exposed: No Periwound Skin Texture Texture Color No Abnormalities Noted: No No Abnormalities Noted: No Callus: No Atrophie Blanche: No Crepitus: No Cyanosis: No Excoriation: No Ecchymosis: No Induration: No Erythema: No Rash: No Hemosiderin Staining: Yes Scarring: Yes Mottled: No Pallor: No Moisture Rubor: No No Abnormalities Noted: No Dry / Scaly: No Maceration: Yes Treatment Notes Wound #7 (Lower Leg) Wound Laterality: Left, Circumferential Cleanser Soap and Water Discharge Instruction: May shower and wash wound with dial antibacterial soap and water prior to dressing change. Peri-Wound Care Sween Lotion (Moisturizing lotion) Discharge Instruction: Apply moisturizing lotion as directed Topical Primary Dressing Maxorb Extra Ag+ Alginate Dressing, 4x4.75 (in/in) Discharge Instruction: Apply to wound bed as instructed Compounding topical antibiotics- [facility] Discharge Instruction: apply under alignate Ag when patient brings with appts. Secondary Dressing ABD Pad, 5x9 Discharge Instruction:  Apply over primary dressing as directed. [name] [name] [name] ([id number].pdf Page 7 of 7 Woven Gauze Sponge, Non-Sterile 4x4 in Discharge Instruction: Apply over primary dressing as directed. Secured With [employer], 4.5x3.1 (in/yd) Discharge  Instruction: Secure with Kerlix as directed. 65M Medipore H Soft Cloth Surgical T ape, 4 x 10 (in/yd) Discharge Instruction: Secure with tape as directed. Compression Wrap Ace wrap Discharge Instruction: apply to legs until compression garments arrive. Compression Stockings Add-Ons Electronic Signature(s) Signed: 08/06/2022 5:04:23 PM By: [name] Pilling RN, [name] Entered By: [name] [name] on 08/06/2022 13:41:38 -------------------------------------------------------------------------------- Vitals Details Patient Name: Date of Service: [name] [name] [name] 08/06/2022 12:45 PM Medical Record Number: [id number] Patient Account Number: [id number] Date of Birth/Sex: Treating RN: [name] (77 y.o. [name] [name] Primary Care Samariah Hokenson: [name] [name] Other Clinician: Referring Kania Regnier: Treating Garrell Flagg/Extender: [name] [name] [name] in Treatment: 19 Vital Signs Time Taken: 13:40 Temperature (F): 97.9 Height (in): 74 Pulse (bpm): 97 Weight (lbs): 250 Respiratory Rate (breaths/min): 20 Body Mass Index (BMI): 32.1 Blood Pressure (mmHg): 192/85 Reference Range: 80 - 120 mg / dl Electronic Signature(s) Signed: 08/06/2022 5:04:23 PM By: [name] Pilling RN, [name] Entered By: [name] [name] on 08/06/2022 13:40:12

## 2022-08-07 NOTE — ED Notes (Signed)
Pt transport to CT  

## 2022-08-07 NOTE — Progress Notes (Signed)
  Transition of Care [facility]) Screening Note   Patient Details  Name: Sevastian Zahra Date of Birth: February 09, 1946   Transition of Care [facility]) CM/SW Contact:    Henrietta Dine, RN Phone Number: 08/07/2022, 3:57 PM    Transition of Care Department [facility]) has reviewed patient and no TOC needs have been identified at this time. We will continue to monitor patient advancement through interdisciplinary progression rounds. If new patient transition needs arise, please place a TOC consult.
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
## 2022-08-07 NOTE — Progress Notes (Signed)
RLE venous duplex has been completed.   Results can be found under chart review under CV PROC. 08/07/2022 10:58 AM Hildreth Orsak RVT, RDMS

## 2022-08-07 NOTE — Progress Notes (Signed)
ANTICOAGULATION CONSULT NOTE - Initial Consult  Pharmacy Consult for heparin Indication: pulmonary embolus  Allergies  Allergen Reactions   Cephalexin Nausea And Vomiting    patient only took one pill and stated that it made him vomit   Mometasone Other (See Comments)    patient states the cream made sores on his leg    Patient Measurements: Weight: 124.7 kg (275 lb) Heparin Dosing Weight: 99kg  Vital Signs: Temp: 97.9 F (36.6 C) (03/21 0013) Temp Source: Oral (03/21 0013) BP: 195/72 (03/20 2356) Pulse Rate: 91 (03/20 2356)  Labs: Recent Labs    08/06/22 2020 08/07/22 0019  HGB  --  8.0*  HCT  --  25.8*  PLT  --  141*  LABPROT  --  18.2*  INR  --  1.5*  CREATININE 1.56*  --     Estimated Creatinine Clearance: 54.8 mL/min (A) (by C-G formula based on SCr of 1.56 mg/dL (H)).   Medical History: Past Medical History:  Diagnosis Date   Anemia    Arthritis    CKD STAGE 3A    PER DR. Vikki Ports CONNOR ON 01/10/2021 OFFICE VISIT   Difficulty sleeping    DVT (deep venous thrombosis) (HCC)    rt leg 3 yrs ago   Hypertension    Leg cramps    Lymphedema    Prostate cancer (HCC)    Swelling of lower extremity    RT LEG   Varicose veins    both legs when he was 77 yrs old. 01/25/2021   Wears glasses    wears glasses for driving.  01/25/2021     Assessment: 77 y.o. male with lymphedema, h/o prostate cancer, HTN, CKD stage 3, h/o blood clot on warfarin, presents with HTN, hip pain.  CT shows multiple small PE's with no heart strain.  Pharmacy to dose heparin drip  Home dose of warfarin 7.5mg  daily, LD unknown  INR 1.5, Hgb 8, plts 141, CrCl 1.56  Goal of Therapy:  Heparin level 0.3-0.7 units/ml Monitor platelets by anticoagulation protocol: Yes   Plan:  Using [place] Heparin bolus 3000 units x 1 then start Heparin drip at 1800 units/hr Heparin level in 8 hours Daily CBC   Dolly Rias RPh 08/07/2022, 1:42 AM

## 2022-08-07 NOTE — ED Provider Notes (Signed)
I assumed care of this patient.  Please see previous provider note for further details of Hx, PE.  Briefly patient is a 77 y.o. male who presented with leg pain and swelling with DOE. Pending labs and CTA to assess for PE.  INR subtherapeutic. CTA + for multipel small PEs. No heart strain. Hep gtt started.  Will admit for [facility] bridge.       Fatima Blank, MD 08/07/22 [phone]

## 2022-08-08 DIAGNOSIS — R0609 Other forms of dyspnea: Secondary | ICD-10-CM

## 2022-08-08 LAB — COMPREHENSIVE METABOLIC PANEL
ALT: 12 U/L (ref 0–44)
AST: 15 U/L (ref 15–41)
Albumin: 3 g/dL — ABNORMAL LOW (ref 3.5–5.0)
Alkaline Phosphatase: 46 U/L (ref 38–126)
Anion gap: 5 (ref 5–15)
BUN: 38 mg/dL — ABNORMAL HIGH (ref 8–23)
CO2: 22 mmol/L (ref 22–32)
Calcium: 8.5 mg/dL — ABNORMAL LOW (ref 8.9–10.3)
Chloride: 110 mmol/L (ref 98–111)
Creatinine, Ser: 1.67 mg/dL — ABNORMAL HIGH (ref 0.61–1.24)
GFR, Estimated: 42 mL/min — ABNORMAL LOW (ref >60)
Glucose, Bld: 90 mg/dL (ref 70–99)
Potassium: 4 mmol/L (ref 3.5–5.1)
Sodium: 137 mmol/L (ref 135–145)
Total Bilirubin: 0.3 mg/dL (ref 0.3–1.2)
Total Protein: 6.2 g/dL — ABNORMAL LOW (ref 6.5–8.1)

## 2022-08-08 LAB — IRON AND TIBC
Iron: 18 ug/dL — ABNORMAL LOW (ref 45–182)
Saturation Ratios: 8 % — ABNORMAL LOW (ref 17.9–39.5)
TIBC: 225 ug/dL — ABNORMAL LOW (ref 250–450)
UIBC: 207 ug/dL

## 2022-08-08 LAB — VITAMIN B12: Vitamin B-12: 353 pg/mL (ref 180–914)

## 2022-08-08 LAB — PREPARE RBC (CROSSMATCH)

## 2022-08-08 LAB — RETICULOCYTES
Immature Retic Fract: 9.5 % (ref 2.3–15.9)
RBC.: 2.84 MIL/uL — ABNORMAL LOW (ref 4.22–5.81)
Retic Count, Absolute: 27 10*3/uL (ref 19.0–186.0)
Retic Ct Pct: 1 % (ref 0.4–3.1)

## 2022-08-08 LAB — CBC
HCT: 24 % — ABNORMAL LOW (ref 39.0–52.0)
Hemoglobin: 7.4 g/dL — ABNORMAL LOW (ref 13.0–17.0)
MCH: 26.1 pg (ref 26.0–34.0)
MCHC: 30.8 g/dL (ref 30.0–36.0)
MCV: 84.8 fL (ref 80.0–100.0)
Platelets: 114 10*3/uL — ABNORMAL LOW (ref 150–400)
RBC: 2.83 MIL/uL — ABNORMAL LOW (ref 4.22–5.81)
RDW: 16.7 % — ABNORMAL HIGH (ref 11.5–15.5)
WBC: 3.7 10*3/uL — ABNORMAL LOW (ref 4.0–10.5)
nRBC: 0 % (ref 0.0–0.2)

## 2022-08-08 LAB — FOLATE: Folate: 8.7 ng/mL (ref >5.9)

## 2022-08-08 LAB — FERRITIN: Ferritin: 79 ng/mL (ref 24–336)

## 2022-08-08 MED ORDER — AMLODIPINE BESYLATE 10 MG PO TABS
5.0000 mg | ORAL_TABLET | Freq: Every morning | ORAL | Status: DC
  Administered 2022-08-08 – 2022-08-10 (×3): 5 mg via ORAL
  Filled 2022-08-08 (×3): qty 1

## 2022-08-08 MED ORDER — FUROSEMIDE 10 MG/ML IJ SOLN
40.0000 mg | Freq: Once | INTRAMUSCULAR | Status: AC
  Administered 2022-08-08: 40 mg via INTRAVENOUS
  Filled 2022-08-08: qty 4

## 2022-08-08 MED ORDER — APIXABAN 5 MG PO TABS
10.0000 mg | ORAL_TABLET | Freq: Two times a day (BID) | ORAL | Status: DC
  Administered 2022-08-08 – 2022-08-10 (×4): 10 mg via ORAL
  Filled 2022-08-08 (×4): qty 2

## 2022-08-08 MED ORDER — SODIUM CHLORIDE 0.9% IV SOLUTION: Freq: Once | INTRAVENOUS | Status: AC

## 2022-08-08 MED ORDER — APIXABAN 5 MG PO TABS: 5.0000 mg | ORAL_TABLET | Freq: Two times a day (BID) | ORAL | Status: DC

## 2022-08-08 MED ORDER — TORSEMIDE 20 MG PO TABS
20.0000 mg | ORAL_TABLET | Freq: Every day | ORAL | Status: DC
  Administered 2022-08-08: 20 mg via ORAL
  Filled 2022-08-08 (×2): qty 1

## 2022-08-08 NOTE — Consult Note (Signed)
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   On: 08/07/2022 01:08   DG Knee 2 Views Right  Result Date: 08/06/2022 CLINICAL DATA:  Concern for femoral neck fracture. Injury 3 weeks ago still painful. EXAM: RIGHT KNEE - 1-2 VIEW; PELVIS - 1-2 VIEW COMPARISON:  CT abdomen and pelvis and reconstructions 07/12/2021. Right knee series 03/12/2022. FINDINGS: AP pelvis, 2 films obtained: No pelvic or proximal femoral fractures are seen AP. Near bone-on-bone axial joint space loss at the right hip is noted with subcortical degenerative cystic changes, with less advanced left hip nonerosive DJD. The SI  joints are symmetric. Widening of the pubic symphysis up to 1.5 cm is similar to the previous exam particularly when the scout image is compared. This could be due to remote pelvic trauma as there does not appear to be erosive disease in the pubic bone. There is pelvic enthesopathy.  There is normal bone mineralization. No other focal abnormality is seen. Vasectomy clips in the superior scrotal areas are again noted. Tubular calcifications in the femoral arteries are present with the appearance suggesting diabetic related atherosclerosis. AP and lateral right knee: There is generalized edema in the soft tissues which was seen previously. There are patchy arterial calcific plaques in the distal thigh and foreleg. There is normal bone mineralization without evidence of fractures. There are mild features of degenerative arthrosis, nonerosive, with meniscal chondrocalcinosis and small enthesopathic spurs of the anterior patella. No loose body is evident. No primary pathologic bone lesion is seen. IMPRESSION: 1. No evidence of fractures. 2. Generalized edema in the soft tissues. 3. Chronic widening of the pubic symphysis, could be  due to remote pelvic trauma. 4. Right-greater-than-left hip DJD. 5. Arterial calcific plaques in the distal thigh and foreleg. 6. Vascular calcifications in the femoral arteries suggesting diabetic related atherosclerosis. Electronically Signed   By: Telford Nab M.D.   On: 08/06/2022 23:39   DG Pelvis 1-2 Views  Result Date: 08/06/2022 CLINICAL DATA:  Concern for femoral neck fracture. Injury 3 weeks ago still painful. EXAM: RIGHT KNEE - 1-2 VIEW; PELVIS - 1-2 VIEW COMPARISON:  CT abdomen and pelvis and reconstructions 07/12/2021. Right knee series 03/12/2022. FINDINGS: AP pelvis, 2 films obtained: No pelvic or proximal femoral fractures are seen AP. Near bone-on-bone axial joint space loss at the right hip is noted with subcortical degenerative cystic changes, with less advanced left  hip nonerosive DJD. The SI joints are symmetric. Widening of the pubic symphysis up to 1.5 cm is similar to the previous exam particularly when the scout image is compared. This could be due to remote pelvic trauma as there does not appear to be erosive disease in the pubic bone. There is pelvic enthesopathy.  There is normal bone mineralization. No other focal abnormality is seen. Vasectomy clips in the superior scrotal areas are again noted. Tubular calcifications in the femoral arteries are present with the appearance suggesting diabetic related atherosclerosis. AP and lateral right knee: There is generalized edema in the soft tissues which was seen previously. There are patchy arterial calcific plaques in the distal thigh and foreleg. There is normal bone mineralization without evidence of fractures. There are mild features of degenerative arthrosis, nonerosive, with meniscal chondrocalcinosis and small enthesopathic spurs of the anterior patella. No loose body is evident. No primary pathologic bone lesion is seen. IMPRESSION: 1. No evidence of fractures. 2. Generalized edema in the soft tissues. 3. Chronic widening of the pubic symphysis, could be due to remote pelvic trauma. 4. Right-greater-than-left hip DJD. 5. Arterial calcific plaques in the distal thigh and foreleg. 6. Vascular calcifications in the femoral arteries suggesting diabetic related atherosclerosis. Electronically Signed   By: Telford Nab M.D.   On: 08/06/2022 23:39   DG Chest 2 View  Result Date: 08/06/2022 CLINICAL DATA:  Shortness of breath EXAM: CHEST - 2 VIEW COMPARISON:  03/11/2022 FINDINGS: Borderline to mild cardiomegaly. Both lungs are clear. The visualized skeletal structures are unremarkable. IMPRESSION: No active cardiopulmonary disease. Borderline to mild cardiomegaly. Electronically Signed   By: Donavan Foil M.D.   On: 08/06/2022 20:21    Review Of Systems Constitutional: No fever, chills, chronic weight gain. Eyes: No  vision change, wears glasses. No discharge or pain. Ears: No hearing loss, No tinnitus. Respiratory: No asthma, COPD, pneumonias. Positive shortness of breath. No hemoptysis. Cardiovascular: No chest pain, palpitation, positive leg edema. Gastrointestinal: No nausea, vomiting, diarrhea, constipation. No GI bleed. No hepatitis. Genitourinary: No dysuria, hematuria, kidney stone. No incontinance. Neurological: No headache, stroke, seizures.  Psychiatry: No psych facility admission for anxiety, depression, suicide. No detox. Skin: No rash. Musculoskeletal: Positive joint pain, No fibromyalgia. Positive neck pain, back pain. Lymphadenopathy: No lymphadenopathy. Hematology: Positive anemia, no easy bruising.   Blood pressure (!) 154/66, pulse 84, temperature 98.2 F (36.8 C), temperature source Oral, resp. rate 17, height 6\' 2"  (1.88 m), weight 124.6 kg, SpO2 100 %. Body mass index is 35.28 kg/m. General appearance: alert, cooperative, appears stated age and no distress Head: Normocephalic, atraumatic. Eyes: Brown eyes, pink conjunctiva, corneas clear. PERRL, EOM's intact. Neck: No adenopathy, no carotid bruit, no JVD, supple, symmetrical, trachea midline and thyroid not enlarged.  Resp: Clear to auscultation bilaterally. Cardio: Regular rate and rhythm, S1, S2 normal, II/VI systolic murmur, no click, rub or gallop GI: Soft, non-tender; bowel sounds normal; no organomegaly. Extremities: Massive edema of both leg, cyanosis or clubbing. Skin: Warm and dry.  Neurologic: Alert and oriented X 3, normal strength. Normal coordination and slow gait.  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 Morbid obesity Chronic osteoarthritis Anemia of iron deficiency Chronic neck pain Chronic lower back  pain  Plan: Agree with Eliquis use/Will switch to warfarin if not covered by insurance. Agree with IV lasix. Continue wound care.  Time spent: Review of old records, Lab, x-rays, EKG, other cardiac tests, examination, discussion with patient/Nurse/Doctor over 70 minutes.  [name], MD  08/08/2022, 2:05 PM

## 2022-08-08 NOTE — Consult Note (Addendum)
[facility] Nurse Consult Note: Reason for Consult: Consult requested for bilat legs. Pt is followed by the outpatient wound care center and was recently seen on 3/20 for serial debridement. They have ordered Silver alginate dressings to be changed daily.  Pt wears velcro compression wraps and uses lymphedema pumps at home. Bilat legs with generalized chronic lymphedema and erythremia. Left lower calf with chronic full thickness wound; .8X.8X.2cm, red and moist. Mod amt tan drainage and white macerated skin surrounding Right lower inner calf with chronic full thickness wound; 6X6X.2cm, red and moist. Mod amt tan drainage and white macerated skin surrounding.  Pt has a blue foam pad in place which he states is to protect his skin, he does not want it removed and states there is no wound underneath. Dressing procedure/placement/frequency: Continue present plan of care as previously ordered. Topical treatment orders provided for bedside nurses to perform as follows: Bedside nurse; change dressings to bilat legs as follows Q day:  (Pt will assist you with how to remove and reapply velcro compression wraps) You will need 2 rolls of kerlex, 4 ABD pads, 3 Aquacel sheets Kellie Simmering # [phone])  1. Remove velcro wraps and old dressings, wash legs with soap and water and pat dry 2. Apply 1 Aquacel over left leg wound, then 2 ABD pads and kerlex, then velcro wraps and a sock over the top 3. Leave blue pad in place over right upper foot/anterior leg, it is for padding and not a wound. 4. Apply 2 Aquacel over right leg wound, then 2 ABD pads and kerlex, then velcro wraps and a sock over the top. Pt should follow-up with the outpatient wound care center after discharge.  Please re-consult if further assistance is needed.  Thank-you,  Julien Girt MSN, [place], [place], [place], [place]
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
## 2022-08-09 DIAGNOSIS — R0609 Other forms of dyspnea: Secondary | ICD-10-CM | POA: Diagnosis not present

## 2022-08-09 LAB — BASIC METABOLIC PANEL
Anion gap: 10 (ref 5–15)
BUN: 40 mg/dL — ABNORMAL HIGH (ref 8–23)
CO2: 21 mmol/L — ABNORMAL LOW (ref 22–32)
Calcium: 8.9 mg/dL (ref 8.9–10.3)
Chloride: 105 mmol/L (ref 98–111)
Creatinine, Ser: 1.68 mg/dL — ABNORMAL HIGH (ref 0.61–1.24)
GFR, Estimated: 41 mL/min — ABNORMAL LOW (ref >60)
Glucose, Bld: 124 mg/dL — ABNORMAL HIGH (ref 70–99)
Potassium: 4 mmol/L (ref 3.5–5.1)
Sodium: 136 mmol/L (ref 135–145)

## 2022-08-09 LAB — TYPE AND SCREEN
ABO/RH(D): A POS
Antibody Screen: NEGATIVE
Unit division: 0

## 2022-08-09 LAB — CBC
HCT: 28.2 % — ABNORMAL LOW (ref 39.0–52.0)
Hemoglobin: 9 g/dL — ABNORMAL LOW (ref 13.0–17.0)
MCH: 26.6 pg (ref 26.0–34.0)
MCHC: 31.9 g/dL (ref 30.0–36.0)
MCV: 83.4 fL (ref 80.0–100.0)
Platelets: 145 10*3/uL — ABNORMAL LOW (ref 150–400)
RBC: 3.38 MIL/uL — ABNORMAL LOW (ref 4.22–5.81)
RDW: 16.5 % — ABNORMAL HIGH (ref 11.5–15.5)
WBC: 5.2 10*3/uL (ref 4.0–10.5)
nRBC: 0 % (ref 0.0–0.2)

## 2022-08-09 LAB — BPAM RBC
Blood Product Expiration Date: 202404182359
ISSUE DATE / TIME: 202403221504
Unit Type and Rh: 6200

## 2022-08-09 MED ORDER — VITAMIN B-12 100 MCG PO TABS
500.0000 ug | ORAL_TABLET | Freq: Every day | ORAL | Status: DC
  Administered 2022-08-09 – 2022-08-10 (×2): 500 ug via ORAL
  Filled 2022-08-09 (×3): qty 5

## 2022-08-09 MED ORDER — FUROSEMIDE 10 MG/ML IJ SOLN
40.0000 mg | Freq: Once | INTRAMUSCULAR | Status: AC
  Administered 2022-08-09: 40 mg via INTRAVENOUS
  Filled 2022-08-09: qty 4

## 2022-08-09 MED ORDER — SODIUM CHLORIDE 0.9 % IV SOLN
250.0000 mg | Freq: Once | INTRAVENOUS | Status: AC
  Administered 2022-08-09: 250 mg via INTRAVENOUS
  Filled 2022-08-09: qty 20
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
## 2022-08-09 NOTE — Consult Note (Signed)
Ref: Dixie Dials, MD  Subjective:  Feeling better. Good diuresis with IV lasix and improved Hgb level.to 9 gm.  Objective:  Vital Signs in the last 24 hours: Temp:  [97.4 F (36.3 C)-98.3 F (36.8 C)] 97.8 F (36.6 C) (03/23 1213) Pulse Rate:  [74-87] 84 (03/23 1213) Cardiac Rhythm: Normal sinus rhythm (03/23 0700) Resp:  [18-23] 18 (03/23 1213) BP: (132-164)/(57-86) 150/62 (03/23 1213) SpO2:  [97 %-100 %] 97 % (03/23 1213)  Physical Exam: BP Readings from Last 1 Encounters:  08/09/22 (!) 150/62     Wt Readings from Last 1 Encounters:  08/07/22 124.6 kg    Weight change:  Body mass index is 35.28 kg/m. HEENT: [place]/AT, Eyes-Brown, Conjunctiva-Pale pink, Sclera-Non-icteric Neck: No JVD, No bruit, Trachea midline. Lungs:  Clear, Bilateral. Cardiac:  Regular rhythm, normal S1 and S2, no S3. II/VI systolic murmur. Abdomen:  Soft, non-tender. BS present. Extremities:  2 + edema present. No cyanosis. No clubbing. CNS: AxOx3, Cranial nerves grossly intact, moves all 4 extremities.  Skin: Warm and dry.   Intake/Output from previous day: 03/22 0701 - 03/23 0700 In: 1666 [P.O.:1260; I.V.:20; Blood:386] Out: 3875 [Urine:3875]    Lab Results: BMET    Component Value Date/Time   NA 136 08/09/2022 0807   NA 137 08/08/2022 0433   NA 137 08/06/2022 2020   K 4.0 08/09/2022 0807   K 4.0 08/08/2022 0433   K 4.5 08/06/2022 2020   CL 105 08/09/2022 0807   CL 110 08/08/2022 0433   CL 110 08/06/2022 2020   CO2 21 (L) 08/09/2022 0807   CO2 22 08/08/2022 0433   CO2 22 08/06/2022 2020   GLUCOSE 124 (H) 08/09/2022 0807   GLUCOSE 90 08/08/2022 0433   GLUCOSE 102 (H) 08/06/2022 2020   BUN 40 (H) 08/09/2022 0807   BUN 38 (H) 08/08/2022 0433   BUN 37 (H) 08/06/2022 2020   CREATININE 1.68 (H) 08/09/2022 0807   CREATININE 1.67 (H) 08/08/2022 0433   CREATININE 1.56 (H) 08/06/2022 2020   CALCIUM 8.9 08/09/2022 0807   CALCIUM 8.5 (L) 08/08/2022 0433   CALCIUM 8.8 (L) 08/06/2022 2020    GFRNONAA 41 (L) 08/09/2022 0807   GFRNONAA 42 (L) 08/08/2022 0433   GFRNONAA 45 (L) 08/06/2022 2020   GFRAA 54 (L) 07/27/2019 1759   GFRAA 52 (L) 07/20/2019 1540   GFRAA 35 (L) 01/14/2014 0443   CBC    Component Value Date/Time   WBC 5.2 08/09/2022 0413   RBC 3.38 (L) 08/09/2022 0413   HGB 9.0 (L) 08/09/2022 0413   HCT 28.2 (L) 08/09/2022 0413   PLT 145 (L) 08/09/2022 0413   MCV 83.4 08/09/2022 0413   MCH 26.6 08/09/2022 0413   MCHC 31.9 08/09/2022 0413   RDW 16.5 (H) 08/09/2022 0413   LYMPHSABS 1.5 08/07/2022 0931   MONOABS 0.7 08/07/2022 0931   EOSABS 0.4 08/07/2022 0931   BASOSABS 0.0 08/07/2022 0931   HEPATIC Function Panel Recent Labs    03/12/22 0443 08/08/22 0433  PROT 7.1 6.2*  ALBUMIN 3.2* 3.0*  AST 30 15  ALT 15 12  ALKPHOS 50 46   HEMOGLOBIN A1C No results found for: "MPG" CARDIAC ENZYMES Lab Results  Component Value Date   TROPONINI <0.30 01/10/2014   BNP No results for input(s): "PROBNP" in the last 8760 hours. TSH No results for input(s): "TSH" in the last 8760 hours. CHOLESTEROL No results for input(s): "CHOL" in the last 8760 hours.  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 Morbid obesity Chronic osteoarthritis Anemia of iron deficiency Chronic neck pain Chronic lower back pain  Continue diuresis. Increase activity. Home in AM if stable.   LOS: 2 days   Time spent including chart  review, lab review, examination, discussion with patient/Nurse/Doctor : 30 min   Dixie Dials  MD  08/09/2022, 1:06 PM

## 2022-08-10 DIAGNOSIS — I2699 Other pulmonary embolism without acute cor pulmonale: Secondary | ICD-10-CM | POA: Diagnosis not present

## 2022-08-10 LAB — CBC
HCT: 27.6 % — ABNORMAL LOW (ref 39.0–52.0)
Hemoglobin: 8.6 g/dL — ABNORMAL LOW (ref 13.0–17.0)
MCH: 26 pg (ref 26.0–34.0)
MCHC: 31.2 g/dL (ref 30.0–36.0)
MCV: 83.4 fL (ref 80.0–100.0)
Platelets: 143 10*3/uL — ABNORMAL LOW (ref 150–400)
RBC: 3.31 MIL/uL — ABNORMAL LOW (ref 4.22–5.81)
RDW: 16.3 % — ABNORMAL HIGH (ref 11.5–15.5)
WBC: 5 10*3/uL (ref 4.0–10.5)
nRBC: 0 % (ref 0.0–0.2)

## 2022-08-10 LAB — BASIC METABOLIC PANEL
Anion gap: 9 (ref 5–15)
BUN: 46 mg/dL — ABNORMAL HIGH (ref 8–23)
CO2: 22 mmol/L (ref 22–32)
Calcium: 8.7 mg/dL — ABNORMAL LOW (ref 8.9–10.3)
Chloride: 105 mmol/L (ref 98–111)
Creatinine, Ser: 1.75 mg/dL — ABNORMAL HIGH (ref 0.61–1.24)
GFR, Estimated: 39 mL/min — ABNORMAL LOW (ref >60)
Glucose, Bld: 98 mg/dL (ref 70–99)
Potassium: 3.8 mmol/L (ref 3.5–5.1)
Sodium: 136 mmol/L (ref 135–145)

## 2022-08-10 MED ORDER — FOLIC ACID 1 MG PO TABS: 1.0000 mg | ORAL_TABLET | Freq: Every day | ORAL | 0 refills | Status: AC

## 2022-08-10 MED ORDER — APIXABAN 5 MG PO TABS: 10.0000 mg | ORAL_TABLET | Freq: Two times a day (BID) | ORAL | 0 refills | Status: AC

## 2022-08-10 MED ORDER — FERROUS SULFATE 325 (65 FE) MG PO TABS
325.0000 mg | ORAL_TABLET | Freq: Every day | ORAL | Status: DC
  Administered 2022-08-10: 325 mg via ORAL
  Filled 2022-08-10: qty 1

## 2022-08-10 MED ORDER — HYDRALAZINE HCL 50 MG PO TABS: 50.0000 mg | ORAL_TABLET | Freq: Three times a day (TID) | ORAL | 1 refills | Status: AC

## 2022-08-10 MED ORDER — CYANOCOBALAMIN 500 MCG PO TABS: 500.0000 ug | ORAL_TABLET | Freq: Every day | ORAL | 0 refills | Status: AC

## 2022-08-10 MED ORDER — FOLIC ACID 1 MG PO TABS
1.0000 mg | ORAL_TABLET | Freq: Every day | ORAL | Status: DC
  Administered 2022-08-10: 1 mg via ORAL
  Filled 2022-08-10: qty 1

## 2022-08-10 MED ORDER — APIXABAN 5 MG PO TABS: 5.0000 mg | ORAL_TABLET | Freq: Two times a day (BID) | ORAL | 3 refills | Status: AC

## 2022-08-10 MED ORDER — TORSEMIDE 20 MG PO TABS: 20.0000 mg | ORAL_TABLET | Freq: Every day | ORAL | 1 refills | Status: AC

## 2022-08-10 MED ORDER — TORSEMIDE 20 MG PO TABS
20.0000 mg | ORAL_TABLET | Freq: Every day | ORAL | Status: DC
  Filled 2022-08-10: qty 1
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
## 2022-08-10 NOTE — Progress Notes (Signed)
Patient discharged home, discharge instructions given and explained to patient, he verbalized understanding, patient denies any pain/distress. Dressing change done this AM by last shift, dressing clean/dry/intact. Accompanied home by friend.

## 2022-08-13 ENCOUNTER — Encounter (HOSPITAL_BASED_OUTPATIENT_CLINIC_OR_DEPARTMENT_OTHER): Payer: Medicare Other | Admitting: Physician Assistant

## 2022-08-13 DIAGNOSIS — L97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 other part of right lower leg with other specified severity: Secondary | ICD-10-CM | POA: Diagnosis not present

## 2022-08-13 DIAGNOSIS — Z8546 Personal history of malignant neoplasm of prostate: Secondary | ICD-10-CM | POA: Diagnosis not present

## 2022-08-13 DIAGNOSIS — I890 Lymphedema, not elsewhere classified: Secondary | ICD-10-CM | POA: Diagnosis not present

## 2022-08-13 DIAGNOSIS — I87333 Chronic venous hypertension (idiopathic) with ulcer and inflammation of bilateral lower extremity: Secondary | ICD-10-CM | POA: Diagnosis not present

## 2022-08-13 DIAGNOSIS — I130 Hypertensive heart and chronic kidney disease with heart failure and stage 1 through stage 4 chronic kidney disease, or unspecified chronic kidney disease: Secondary | ICD-10-CM | POA: Diagnosis not present

## 2022-08-13 DIAGNOSIS — N1832 Chronic kidney disease, stage 3b: Secondary | ICD-10-CM | POA: Diagnosis not present

## 2022-08-13 DIAGNOSIS — L97828 Non-pressure chronic ulcer of other part of left lower leg with other specified severity: Secondary | ICD-10-CM | POA: Diagnosis not present

## 2022-08-13 DIAGNOSIS — Z86718 Personal history of other venous thrombosis and embolism: Secondary | ICD-10-CM | POA: Diagnosis not present

## 2022-08-13 NOTE — Progress Notes (Signed)
Tippin, [name] ([id number]) [id number] Page 1 of 12 Visit Report for 08/13/2022 Chief Complaint Document Details Patient Name: Date of Service: [name], [name] 08/13/2022 12:45 PM Medical Record Number: [id number] Patient Account Number: [id number] Date of Birth/Sex: Treating RN: September 11, 1945 (77 y.o. M) Primary Care Provider: Birdie [name] Other Clinician: Referring Provider: Treating Provider/Extender: [name] in Treatment: 20 Information Obtained from: Patient Chief Complaint 02/23/2020; patient is here for wounds on his bilateral lower legs in the setting of severe lymphedema 03/26/2022; patient is here for wounds on his bilateral lower legs medial aspect Electronic Signature(s) Signed: 08/13/2022 12:53:06 PM By: Worthy Keeler [name] Entered By: Worthy [name] on 08/13/2022 12:53:06 -------------------------------------------------------------------------------- Debridement Details Patient Name: Date of Service: [name], [name] 08/13/2022 12:45 PM Medical Record Number: [id number] Patient Account Number: [id number] Date of Birth/Sex: Treating RN: 1945/06/25 (77 y.o. [name], [name] Primary Care Provider: Birdie [name] Other Clinician: Referring Provider: Treating Provider/Extender: [name] in Treatment: 20 Debridement Performed for Assessment: Wound #7 Left,Circumferential Lower Leg Performed By: Physician Worthy Keeler, PA Debridement Type: Debridement Level of Consciousness (Pre-procedure): Awake and Alert Pre-procedure Verification/Time Out Yes - 13:25 Taken: Start Time: 13:26 Pain Control: Lidocaine 4% T opical Solution T Area Debrided (L x W): otal 4 (cm) x 4 (cm) = 16 (cm) Tissue and other material debrided: Viable, Non-Viable, Slough, Subcutaneous, Biofilm, Slough Level: Skin/Subcutaneous Tissue Debridement Description: Excisional Instrument: Curette Bleeding:  Minimum Hemostasis Achieved: Pressure End Time: 13:30 Procedural Pain: 0 Post Procedural Pain: 0 Response to Treatment: Procedure was tolerated well Level of Consciousness (Post- Awake and Alert procedure): Post Debridement Measurements of Total Wound Length: (cm) 8.8 Width: (cm) 26.5 Depth: (cm) 0.1 Volume: (cm) 18.315 Character of Wound/Ulcer Post Debridement: Improved Post Procedure Diagnosis Same as Pre-procedure Electronic Signature(s) Unsigned Entered By: [name] on 08/13/2022 13:30:32 Signature(s): [name], [name] ([id number]) [id number] Date(s): [id number] Page 2 of 12 -------------------------------------------------------------------------------- Debridement Details Patient Name: Date of Service: [name], [name] 08/13/2022 12:45 PM Medical Record Number: [id number] Patient Account Number: [id number] Date of Birth/Sex: Treating RN: 05-04-1946 (77 y.o. [name], [name] Primary Care Provider: Birdie [name] Other Clinician: Referring Provider: Treating Provider/Extender: [name] in Treatment: 20 Debridement Performed for Assessment: Wound #6 Right,Medial Lower Leg Performed By: Physician Worthy Keeler, PA Debridement Type: Debridement Level of Consciousness (Pre-procedure): Awake and Alert Pre-procedure Verification/Time Out Yes - 13:25 Taken: Start Time: 13:26 Pain Control: Lidocaine 4% T opical Solution T Area Debrided (L x W): otal 9 (cm) x 3 (cm) = 27 (cm) Tissue and other material debrided: Viable, Non-Viable, Slough, Subcutaneous, Biofilm, Slough Level: Skin/Subcutaneous Tissue Debridement Description: Excisional Instrument: Curette Bleeding: Minimum Hemostasis Achieved: Pressure End Time: 13:30 Procedural Pain: 0 Post Procedural Pain: 0 Response to Treatment: Procedure was tolerated well Level of Consciousness (Post- Awake and Alert procedure): Post Debridement Measurements of Total  Wound Length: (cm) 9 Width: (cm) 4.2 Depth: (cm) 0.1 Volume: (cm) 2.969 Character of Wound/Ulcer Post Debridement: Improved Post Procedure Diagnosis Same as Pre-procedure Electronic Signature(s) Unsigned Entered By: [name] on 08/13/2022 13:32:09 -------------------------------------------------------------------------------- HPI Details Patient Name: Date of Service: [name], [name] 08/13/2022 12:45 PM Medical Record Number: [id number] Patient Account Number: [id number] Date of Birth/Sex: Treating RN: 23-Apr-1946 (77 y.o. M) Primary Care Provider: Birdie [name] Other Clinician: Referring Provider: Treating Provider/Extender: [name] in Treatment: 20 History of Present Illness HPI Description: ADMISSION 02/23/2020 Patient  is a 77 year old man who lives in [place] who arrives accompanied by his wife. He has a history of chronic lymphedema and venous insufficiency in his bilateral lower legs which may have something to do that with having a history of DVT as well as being treated for prostate cancer. In any case he recently got compression pumps at home but compliance has been an issue here. He has compression stockings however they are probably not sufficient enough to control swelling. They tell us that things deteriorated for him in late August he was admitted to [facility] for 7 days. This was with cellulitis I think of his bilateral lower legs. Discharge he was noted to have wounds on his bilateral lower legs. He was discharged on Bactrim. They tried to get him home health through [facility] of course they declined him. His wife is been wrapping these applying some form of silver foam dressing. He has a history of wounds before although nothing that would not heal with basic home topical dressings. He has 2 areas on the left medial, left anterior and left lateral and a smaller area [name] ([id number])  [id number] Page 3 of 12 on the right medial. All of these have considerable depth. Past medical history includes iron deficiency anemia, lymphedema followed by the rehab center at [facility] with lymphedema wraps I believe, DVT on chronic anticoagulation, prostate cancer, chronic venous insufficiency, hypertension. As mentioned he has compression pumps but does not use them. ABIs in our clinic were noncompressible bilaterally 10/14; patient with severe bilateral lymphedema right greater than left. He came in with bilateral lower extremity wounds left greater than right. Even though the right side has more of the edema most of the wounds here almost closed on the right medial. He has 3 remaining wounds on the left We have been using silver alginate under 4-layer compression I have been trying to get him to be compliant with his external compression pumps 10/21; patient with 3 small wounds on the left leg and 1 on the right medial in the setting of severe lymphedema and chronic venous insufficiency. We have been using silver alginate under 4-layer compression he is using his external compression pumps twice a day 11/4; ARTERIAL STUDIES on the right show an ABI of 1.02 TBI of 0.858 with biphasic waveforms on the left 0.98 with a TBI of 0.55 and biphasic waveforms. Does not look like he has significant arterial disease. We are treating him for lymphedema he has compression pumps. He has punched-out areas on the left anterior left lateral and right medial lower extremities 11/11; after we obtained his arterial studies I put him in 4 layer compression. He is using his compression pumps probably once a day although I have asked him to do twice. Primary dressing to the wound is silver collagen he has severe lymphedema likely secondary to chronic venous insufficiency. Wounds on the left lateral, left medial and left anterior and a small area on the right medial 12/2; the area on  the right anterior lower leg has healed. We initially thought that the area medially had healed as well however when her discharge nurse came in she detected fluid in the wound simply opened up. This is actually worse than I remember this pain. The area on the left lateral potentially slightly smaller He is also complaining about pain in his left hand he says that this is actually been getting some better he has been using topical creams on this.  She asked that I look at this 12/9 after last [name] issues we have 2 wounds one on the right medial lower leg and 1 on the left lateral. Both of these are in the same condition. I think because of thickened skin secondary to chronic lymphedema these wounds actually have depth of almost 0.8 cm. 12/16; the patient has 2 small but deep wounds one on the right medial and one on the left lateral. The right medial is actually the worst of these. He arrives in clinic today with absolutely terrible edema in the right leg apparently his 4-layer wrap fell down to just above his ankle he did not think about this he is apparently been continuing to use his compression pump twice a day. The left leg looks a lot better. 05/09/2020 upon evaluation today patient appears to be doing decently well in regard to his wounds. Everything is measuring smaller the right leg still has a little bit deeper wound in the left seems to be almost completely healed in my opinion I am very pleased in general with how things are progressing. He has a 4- layer compression wrap we have been using endoform today we will probably have to use collagen just based on the fact that we do not have endoform it is on order. 1/6; the patient's wound on the left lateral lower leg has healed. Still has 1 on the right medial. 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 Using compression pumps at home twice a day. 1/13; left lateral lower leg is still healed. He has a deep punched out  rectangular shaped wound on the right medial calf. Looking down at this it appears that he is attempting to epithelialize around the edges of the wound and on the base as well. His edema is reasonably well controlled we have been using collagen with absolutely no effect 1/20; left lateral lower leg remains closed he has extremitease stockings. The area on the right medial calf I aggressively debrided last week measures larger but the surface looks better. We have been using Hydrofera Blue. We ran Oasis through his insurance but we have not seen the results of this 1/27; left lower leg wound with chronic venous insufficiency and secondary lymphedema. I did aggressive debridement on this last week the wound seems to have come in healthy looking surface using Hydrofera Blue. He was denied for Oasis 2/3; small divot in the right medial lower leg. Under illumination the walls of this divot are epithelialized however the base has slough which I removed with a curette we have been using Hydrofera Blue 2/10 small divot on the right medial lower leg pinpoint illumination at the base of this cone-shaped wound. We have been using Hydrofera Blue but I will switch to calcium alginate this week 2/17; the small divot on the right medial lower leg is fully epithelialized. There is no visible open area under illumination. He has his own stocking for the right leg similar to the one he has been wearing on the left. 03/26/2022; READMISSION This is a now 77 year old man that we had in the clinic from 02/23/2020 through 07/05/2020. At that point he had bilateral lower extremity wounds left greater than right in the setting of severe lymphedema. He had already obtained compression pumps ordered for him I think from the wound care clinic in [facility] so I do not really have record of what he has been using. He claims to be using them once a day but there  is a problem with the sleeve on the left leg. About 2  [name] ago he was hospitalized from 03/11/2022 through [id number] with diastolic congestive heart failure. 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 He was diuresed. Developed some prerenal azotemia and he has not been taking any diuretics currently. He has not been putting stockings on his legs since he got out of hospital and still has his legs dependent for long periods. Past medical history history of prostate cancer treated with prostatectomy and radiation this was apparently about 8 years ago, history of DVT on chronic Coumadin, history of lymphedema was managed for a while at the clinic in [place]. History of inguinal hernia repair in September 22, hypertension, stage IIIb chronic renal failure ABIs today were noncompressible on the right 1.12 on the left 04-02-2022 upon evaluation today patient appears to be doing well currently in regard to his legs I do feel like both areas that are draining are actually much drier than they were in the picture last week although the left is drier than the right. He is tolerating the 4-layer compression wraps at this point he did contact the pump company and they are actually working on getting him a new compression sleeve for one of his legs which have previously popped and was not functioning properly. 04-23-2022 upon evaluation today patient appears to be doing well currently in regard to his wounds on the legs. I am actually very pleased with where things stand and I do feel like that we are headed in the right direction. Fortunately there is no sign of active infection locally or systemically at this time. 05-07-2022 upon evaluation today patient appears to be doing well currently in regard to his wounds in fact things are showing signs of improvement which is good news I do not see too much that actually appears to be open and I am very pleased in that regard. No fevers, chills, nausea, vomiting, or  diarrhea. 05-21-2022 upon evaluation today patient appears to be doing somewhat poorly in regard to drainage of his lower extremities bilaterally. The right is greater than left as far as the weeping area. Nonetheless it seems to be getting worse not better. He actually has pitting edema which is at least 2+ to the thighs and I am concerned about the fact that he is may be fluid overloaded in general and that is the reason why we cannot get this under control. I know he is not using his pumps all the time because he actually told the nurse that he was either going to pump or he was going to use his fluid pills but not do both. For that reason I do think that he needs to be really doing both in order to get the fluid out as effectively as possible obviously with the 4-layer compression wraps were doing as much as we can from a compression standpoint but it is really not enough. He tells me that he elevates his leg is much as he can in between pumping and other activity throughout the day. 05-28-2022 upon evaluation today patient appears to be doing better in regard to his wounds although the measurements may be a little bit larger this is a very difficult wound to heal it is very indistinct in a lot of areas. Nonetheless there is can be some need for sharp debridement in regard to both medial and lateral [name], [name] ([id number]) [id number] Page 4 of 12 legs. Fortunately  I see no signs of active infection locally nor systemically at this time. No fevers, chills, nausea, vomiting, or diarrhea. 06-04-2022 upon evaluation today patient appears to be doing poorly in general in regard to the wounds on his legs. He still continues to have a tremendous amount of fluid not just in the lower portion of his leg but to be honest his thigh where he has 2-3+ pitting edema in the thigh as well. Unfortunately I do not know that we will be able to get this healed effectively and keep it  healed on the lower extremities unless he gets the overall fluid situation taking and under control. Fortunately I do not see any signs of infection locally nor systemically which is great news. He just seems to be very fluid overloaded. 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 He has been tolerating the dressing changes with the compression wraps without complication. Fortunately I do not see any evidence of infection at this time which is great news. No fevers, chills, nausea, vomiting, or diarrhea. 06-18-2022 upon evaluation today patient appears to be doing well currently in regard to his wounds as far as not looking like they are terribly infected but nonetheless I am concerned about a subacute infection secondary to the fact that he continues to have spreading despite the compression therapy. We actually did do an Unna boot on him last week this is actually the first wrap that actually stayed up everything else has been sliding down quite significantly. Fortunately there does not appear to be any signs of infection systemically at this time. With that being said I do believe that locally there seems to be an issue going on here and again I Georgina Peer do a PCR culture to see what that shows also think that I am going to put him on a broad-spectrum antibiotic, doxycycline to see how that will help as well. He does tell me that coming into the clinic today that he was feeling short of breath like "he was about to have a heart attack" because he was having such a hard time breathing. He says that he told this to Dr. Debbora Dus his cardiologist as well when he was evaluated in the past 1 to 2 [name]. 07-02-2022 upon evaluation today patient appears to be doing poorly currently in regard to his wound. He has been tolerating the dressing changes. Unfortunately he has not had any compression wraps on  for the past week because he was unable to make it in for his appointment last week. With that being said he has a significant amount of drainage he tells me has been using his pumps but despite this in the pumps he still has been draining quite a bit. The drainage is also somewhat purulent unfortunately. We did attempt to get in touch with his cardiologist last week unfortunately we were unable to get up with him I did advise that the patient needs to get in touch with him upon leaving today in order to make sure they know he is on the new antibiotics I am going to send him this will be Levaquin and Augmentin. 07-09-2022 upon evaluation today patient appears to be doing about the same in regard to his legs he may have just a slight amount of improvement with regard to the drainage probably Keystone topical antibiotics are helping in this regard to some degree. Fortunately there does not appear to be any signs of  active infection systemically which is great news. No fevers, chills, nausea, vomiting, or diarrhea. 07-16-2022 upon evaluation today patient appears to be doing well currently in regard to his wound. He has been tolerating the dressing changes without complication. Fortunately there does not appear to be any signs of active infection locally nor systemically at this time. With that being said he cannot keep the wraps up he tells me on the left side he had to cut this down because it got too tight. He has been using his pumps but he is on the right side the wrap actually straight down causing some pushing around the central part of his leg just below the calf I think this is a bigger risk for him that help at this point. I think that we may need to try something different he should be getting his compression socks shortly he tells me they were ordered last Thursday. 3/6; ; this is a patient who lives in [place]. He has severe bilateral lymphedema. He has compression pumps, we have been using  kerlix Ace wrap Keystone. He is changing the dressing. We do not have home health. 08-06-2022 upon evaluation today patient appears to be doing a little better in regard to his wounds in general at this point. Fortunately there does not appear to be any signs of active infection locally nor systemically at this time which is great news and overall I am extremely pleased with where we stand today. 08-13-2022 upon evaluation today patient appears to actually be doing significantly better compared to last week. He actually did go to the hospital I told him that he needed to when he left here and he actually did go. With that being said they actually ended up admitting him he was having shortness of breath and I thought it might be related to congestive heart failure turns out he actually had a pulmonary embolism. Subsequently they were able to get him off of the Coumadin switching over to Eliquis to get things stabilized in that regard they also had them wrapped and got his swelling under control on his legs he actually looks much better pretty much across the board at this point. I am very pleased in that regard. With that being said I am very happy that he finally went that could have been a very dangerous situation. Electronic Signature(s) Signed: 08/13/2022 1:37:33 PM By: Worthy Keeler [name] Entered By: Worthy [name] on 08/13/2022 13:37:33 -------------------------------------------------------------------------------- Physical Exam Details Patient Name: Date of Service: [name], [name] 08/13/2022 12:45 PM Medical Record Number: [id number] Patient Account Number: [id number] Date of Birth/Sex: Treating RN: 07-Feb-1946 (77 y.o. M) Primary Care Provider: Birdie [name] Other Clinician: Referring Provider: Treating Provider/Extender: [name] in Treatment: 20 Constitutional Chronically ill appearing but in no apparent acute distress. Respiratory normal breathing  without difficulty. Psychiatric this patient is able to make decisions and demonstrates good insight into disease process. Alert and Oriented x 3. pleasant and cooperative. Notes Upon inspection patient's wound bed actually showed signs of good granulation and epithelization in fact he is doing significantly better compared to last time I saw him I am extremely pleased again he did have quite an adventure it sounds like with 5 days in the hospital but it was absolutely necessary I am glad that he went. Electronic Signature(s) Signed: 08/13/2022 1:38:09 PM By: Irean Hong Perdew, [name] ([id number]) [id number] Page 5 of 12 Entered By: Worthy [name] on 08/13/2022 13:38:09 --------------------------------------------------------------------------------  Physician Orders Details Patient Name: Date of Service: [name], [name] 08/13/2022 12:45 PM Medical Record Number: [id number] Patient Account Number: [id number] Date of Birth/Sex: Treating RN: [birth year] (77 y.o. [name], [name] Primary Care Provider: Birdie [name] Other Clinician: Referring Provider: Treating Provider/Extender: [name] in Treatment: 20 Verbal / Phone Orders: No Diagnosis Coding ICD-10 Coding Code Description [phone] Chronic venous hypertension (idiopathic) with ulcer and inflammation of bilateral lower extremity I89.0 Lymphedema, not elsewhere classified 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 other part of right lower leg with other specified severity Follow-up Appointments ppointment in 1 week. Margarita Grizzle Wednesday 08/20/2022 1230 Return A ppointment in 2 [name]. Margarita Grizzle Wednesday 08/27/2022 1245 Return A Other: - ***Change daily. ***** Anesthetic (In clinic) Topical Lidocaine 5% applied to wound bed Bathing/ Shower/ Hygiene May shower with protection but do  not get wound dressing(s) wet. Protect dressing(s) with water repellant cover (for example, large plastic bag) or a cast cover and may then take shower. Edema Control - Lymphedema / SCD / Other Lymphedema Pumps. Use Lymphedema pumps on leg(s) 2-3 times a day for 45-60 minutes. If wearing any wraps or hose, do not remove them. Continue exercising as instructed. - *****pump 3 times a day for an hour each time.***** Elevate legs to the level of the heart or above for 30 minutes daily and/or when sitting for 3-4 times a day throughout the day. - important Avoid standing for long periods of time. Exercise regularly Compression stocking or Garment 30-40 mm/Hg pressure to: - juxtafit Essentials Long for bilateral lower legs; quantity three juxtafit essentials per limb. Wound Treatment Wound #6 - Lower Leg Wound Laterality: Right, Medial Cleanser: Soap and Water 1 x Per Day/30 Days Discharge Instructions: May shower and wash wound with dial antibacterial soap and water prior to dressing change. Peri-Wound Care: Sween Lotion (Moisturizing lotion) 1 x Per Day/30 Days Discharge Instructions: Apply moisturizing lotion as directed Prim Dressing: Maxorb Extra Ag+ Alginate Dressing, 4x4.75 (in/in) 1 x Per Day/30 Days ary Discharge Instructions: Apply to wound bed as instructed Prim Dressing: Compounding topical antibiotics- [place] 1 x Per Day/30 Days ary Discharge Instructions: apply under alignate Ag when patient brings with appts. Secondary Dressing: ABD Pad, 5x9 1 x Per Day/30 Days Discharge Instructions: Apply over primary dressing as directed. Secondary Dressing: Woven Gauze Sponge, Non-Sterile 4x4 in 1 x Per Day/30 Days Discharge Instructions: Apply over primary dressing as directed. Secured With: [employer], 4.5x3.1 (in/yd) 1 x Per Day/30 Days Discharge Instructions: Secure with Kerlix as directed. Secured With: 2M Medipore H Soft Cloth Surgical T ape, 4 x 10 (in/yd) 1 x Per  Day/30 Days Discharge Instructions: Secure with tape as directed. Compression Wrap: Juxtafit 30-55mmHg 1 x Per Day/30 Days Discharge Instructions: apply in the morning and remove at night. Wound #7 - Lower Leg Wound Laterality: Left, Circumferential Cleanser: Soap and Water 1 x Per Day/30 Days Discharge Instructions: May shower and wash wound with dial antibacterial soap and water prior to dressing change. Sundby, [name] ([id number]) [id number] Page 6 of 12 Peri-Wound Care: Sween Lotion (Moisturizing lotion) 1 x Per Day/30 Days Discharge Instructions: Apply moisturizing lotion as directed Prim Dressing: Maxorb Extra Ag+ Alginate Dressing, 4x4.75 (in/in) 1 x Per Day/30 Days ary Discharge Instructions: Apply to wound bed as instructed Prim Dressing: Compounding topical antibiotics- [place] 1 x Per Day/30 Days ary Discharge Instructions: apply under  alignate Ag when patient brings with appts. Secondary Dressing: ABD Pad, 5x9 1 x Per Day/30 Days Discharge Instructions: Apply over primary dressing as directed. Secondary Dressing: Woven Gauze Sponge, Non-Sterile 4x4 in 1 x Per Day/30 Days Discharge Instructions: Apply over primary dressing as directed. Secured With: [employer], 4.5x3.1 (in/yd) 1 x Per Day/30 Days Discharge Instructions: Secure with Kerlix as directed. Secured With: 35M Medipore H Soft Cloth Surgical T ape, 4 x 10 (in/yd) 1 x Per Day/30 Days Discharge Instructions: Secure with tape as directed. Compression Wrap: Juxtafit 30-42mmHg 1 x Per Day/30 Days Discharge Instructions: apply in the morning and remove at night. Electronic Signature(s) Unsigned Entered By: [name] on 08/13/2022 13:31:38 -------------------------------------------------------------------------------- Problem List Details Patient Name: Date of Service: Muns, [place] 08/13/2022 12:45 PM Medical Record Number: [id number] Patient Account Number:  [id number] Date of Birth/Sex: Treating RN: 27-May-1945 (77 y.o. M) Primary Care Provider: Birdie [name] Other Clinician: Referring Provider: Treating Provider/Extender: [name] Active Problems ICD-10 Encounter Code Description Active Date MDM Diagnosis I87.333 Chronic venous hypertension (idiopathic) with ulcer and inflammation of 06/11/2022 No Yes bilateral lower extremity I89.0 Lymphedema, not elsewhere classified 03/26/2022 No Yes L97.828 Non-pressure chronic ulcer of other part of left lower leg with other specified 03/26/2022 No Yes severity 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 other part of right lower leg with other specified 03/26/2022 No Yes severity Inactive Problems Resolved Problems Electronic Signature(s) [name], [name] ([id number]) [id number] Page 7 of 12 Signed: 08/13/2022 12:52:57 PM By: Worthy Keeler [name] Entered By: Worthy [name] on 08/13/2022 12:52:57 -------------------------------------------------------------------------------- Progress Note Details Patient Name: Date of Service: [name], [name] 08/13/2022 12:45 PM Medical Record Number: [id number] Patient Account Number: [id number] Date of Birth/Sex: Treating RN: 1945/07/02 (77 y.o. M) Primary Care Provider: Birdie [name] Other Clinician: Referring Provider: Treating Provider/Extender: [name] in Treatment: 20 Subjective Chief Complaint Information obtained from Patient 02/23/2020; patient is here for wounds on his bilateral lower legs in the setting of severe lymphedema 03/26/2022; patient is here for wounds on his bilateral lower legs medial aspect History of Present Illness (HPI) ADMISSION 02/23/2020 Patient is a 77 year old man who lives in [place] who arrives accompanied by his wife. He has a history of chronic lymphedema and venous insufficiency in his bilateral lower legs which may  have something to do that with having a history of DVT as well as being treated for prostate cancer. In any case he recently got compression pumps at home but compliance has been an issue here. He has compression stockings however they are probably not sufficient enough to control swelling. They tell us that things deteriorated for him in late August he was admitted to [facility] for 7 days. This was with cellulitis I think of his bilateral lower legs. Discharge he was noted to have wounds on his bilateral lower legs. He was discharged on Bactrim. They tried to get him home health through [facility] of course they declined him. His wife is been wrapping these applying some form of silver foam dressing. He has a history of wounds before although nothing that would not heal with basic home topical dressings. He has 2 areas on the left medial, left anterior and left lateral and a smaller area on the right medial. All of these have considerable depth. Past medical history includes iron deficiency anemia, lymphedema followed by the rehab center at [facility] with lymphedema  wraps I believe, DVT on chronic anticoagulation, prostate cancer, chronic venous insufficiency, hypertension. As mentioned he has compression pumps but does not use them. ABIs in our clinic were noncompressible bilaterally 10/14; patient with severe bilateral lymphedema right greater than left. He came in with bilateral lower extremity wounds left greater than right. Even though the right side has more of the edema most of the wounds here almost closed on the right medial. He has 3 remaining wounds on the left We have been using silver alginate under 4-layer compression I have been trying to get him to be compliant with his external compression pumps 10/21; patient with 3 small wounds on the left leg and 1 on the right medial in the setting of severe lymphedema and chronic venous insufficiency. We have been using  silver alginate under 4-layer compression he is using his external compression pumps twice a day 11/4; ARTERIAL STUDIES on the right show an ABI of 1.02 TBI of 0.858 with biphasic waveforms on the left 0.98 with a TBI of 0.55 and biphasic waveforms. Does not look like he has significant arterial disease. We are treating him for lymphedema he has compression pumps. He has punched-out areas on the left anterior left lateral and right medial lower extremities 11/11; after we obtained his arterial studies I put him in 4 layer compression. He is using his compression pumps probably once a day although I have asked him to do twice. Primary dressing to the wound is silver collagen he has severe lymphedema likely secondary to chronic venous insufficiency. Wounds on the left lateral, left medial and left anterior and a small area on the right medial 12/2; the area on the right anterior lower leg has healed. We initially thought that the area medially had healed as well however when her discharge nurse came in she detected fluid in the wound simply opened up. This is actually worse than I remember this pain. The area on the left lateral potentially slightly smaller He is also complaining about pain in his left hand he says that this is actually been getting some better he has been using topical creams on this. She asked that I look at this 12/9 after last [name] issues we have 2 wounds one on the right medial lower leg and 1 on the left lateral. Both of these are in the same condition. I think because of thickened skin secondary to chronic lymphedema these wounds actually have depth of almost 0.8 cm. 12/16; the patient has 2 small but deep wounds one on the right medial and one on the left lateral. The right medial is actually the worst of these. He arrives in clinic today with absolutely terrible edema in the right leg apparently his 4-layer wrap fell down to just above his ankle he did not think about this he  is apparently been continuing to use his compression pump twice a day. The left leg looks a lot better. 05/09/2020 upon evaluation today patient appears to be doing decently well in regard to his wounds. Everything is measuring smaller the right leg still has a little bit deeper wound in the left seems to be almost completely healed in my opinion I am very pleased in general with how things are progressing. He has a 4- layer compression wrap we have been using endoform today we will probably have to use collagen just based on the fact that we do not have endoform it is on order. 1/6; the patient's wound on the left lateral lower  leg has healed. Still has 1 on the right medial. 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 Using compression pumps at home twice a day. 1/13; left lateral lower leg is still healed. He has a deep punched out rectangular shaped wound on the right medial calf. Looking down at this it appears that he is attempting to epithelialize around the edges of the wound and on the base as well. His edema is reasonably well controlled we have been using collagen with absolutely no effect 1/20; left lateral lower leg remains closed he has extremitease stockings. The area on the right medial calf I aggressively debrided last week measures larger but the surface looks better. We have been using Hydrofera Blue. We ran Oasis through his insurance but we have not seen the results of this 1/27; left lower leg wound with chronic venous insufficiency and secondary lymphedema. I did aggressive debridement on this last week the wound seems to have come in healthy looking surface using Hydrofera Blue. He was denied for Oasis 2/3; small divot in the right medial lower leg. Under illumination the walls of this divot are epithelialized however the base has slough which I removed with a curette we have been using Hydrofera Blue 2/10 small divot on the right medial lower leg pinpoint  illumination at the base of this cone-shaped wound. We have been using Hydrofera Blue but I will switch to calcium alginate this week 2/17; the small divot on the right medial lower leg is fully epithelialized. There is no visible open area under illumination. He has his own stocking for the right leg [name], [name] ([id number]) [id number] Page 8 of 12 similar to the one he has been wearing on the left. 03/26/2022; READMISSION This is a now 77 year old man that we had in the clinic from 02/23/2020 through 07/05/2020. At that point he had bilateral lower extremity wounds left greater than right in the setting of severe lymphedema. He had already obtained compression pumps ordered for him I think from the wound care clinic in [facility] so I do not really have record of what he has been using. He claims to be using them once a day but there is a problem with the sleeve on the left leg. About 2 [name] ago he was hospitalized from 03/11/2022 through [id number] with diastolic congestive heart failure. 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 He was diuresed. Developed some prerenal azotemia and he has not been taking any diuretics currently. He has not been putting stockings on his legs since he got out of hospital and still has his legs dependent for long periods. Past medical history history of prostate cancer treated with prostatectomy and radiation this was apparently about 8 years ago, history of DVT on chronic Coumadin, history of lymphedema was managed for a while at the clinic in [place]. History of inguinal hernia repair in September 22, hypertension, stage IIIb chronic renal failure ABIs today were noncompressible on the right 1.12 on the left 04-02-2022 upon evaluation today patient appears to be doing well currently in regard to his legs I do feel like both areas that are draining are actually much drier  than they were in the picture last week although the left is drier than the right. He is tolerating the 4-layer compression wraps at this point he did contact the pump company and they are actually working on getting him a new compression sleeve for one of his legs which  have previously popped and was not functioning properly. 04-23-2022 upon evaluation today patient appears to be doing well currently in regard to his wounds on the legs. I am actually very pleased with where things stand and I do feel like that we are headed in the right direction. Fortunately there is no sign of active infection locally or systemically at this time. 05-07-2022 upon evaluation today patient appears to be doing well currently in regard to his wounds in fact things are showing signs of improvement which is good news I do not see too much that actually appears to be open and I am very pleased in that regard. No fevers, chills, nausea, vomiting, or diarrhea. 05-21-2022 upon evaluation today patient appears to be doing somewhat poorly in regard to drainage of his lower extremities bilaterally. The right is greater than left as far as the weeping area. Nonetheless it seems to be getting worse not better. He actually has pitting edema which is at least 2+ to the thighs and I am concerned about the fact that he is may be fluid overloaded in general and that is the reason why we cannot get this under control. I know he is not using his pumps all the time because he actually told the nurse that he was either going to pump or he was going to use his fluid pills but not do both. For that reason I do think that he needs to be really doing both in order to get the fluid out as effectively as possible obviously with the 4-layer compression wraps were doing as much as we can from a compression standpoint but it is really not enough. He tells me that he elevates his leg is much as he can in between pumping and other activity throughout the  day. 05-28-2022 upon evaluation today patient appears to be doing better in regard to his wounds although the measurements may be a little bit larger this is a very difficult wound to heal it is very indistinct in a lot of areas. Nonetheless there is can be some need for sharp debridement in regard to both medial and lateral legs. Fortunately I see no signs of active infection locally nor systemically at this time. No fevers, chills, nausea, vomiting, or diarrhea. 06-04-2022 upon evaluation today patient appears to be doing poorly in general in regard to the wounds on his legs. He still continues to have a tremendous amount of fluid not just in the lower portion of his leg but to be honest his thigh where he has 2-3+ pitting edema in the thigh as well. Unfortunately I do not know that we will be able to get this healed effectively and keep it healed on the lower extremities unless he gets the overall fluid situation taking and under control. Fortunately I do not see any signs of infection locally nor systemically which is great news. He just seems to be very fluid overloaded. 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 He has been tolerating the dressing changes with the compression wraps without complication. Fortunately I do not see any evidence of infection at this time which is great news. No fevers, chills, nausea, vomiting, or diarrhea. 06-18-2022 upon evaluation today patient appears to be doing well currently in regard to his wounds as far as not looking like they are terribly infected but nonetheless I am concerned about a subacute infection secondary to the fact that he continues to  have spreading despite the compression therapy. We actually did do an Unna boot on him last week this is actually the first wrap that actually stayed up everything else has been sliding down quite  significantly. Fortunately there does not appear to be any signs of infection systemically at this time. With that being said I do believe that locally there seems to be an issue going on here and again I Georgina Peer do a PCR culture to see what that shows also think that I am going to put him on a broad-spectrum antibiotic, doxycycline to see how that will help as well. He does tell me that coming into the clinic today that he was feeling short of breath like "he was about to have a heart attack" because he was having such a hard time breathing. He says that he told this to Dr. Debbora Dus his cardiologist as well when he was evaluated in the past 1 to 2 [name]. 07-02-2022 upon evaluation today patient appears to be doing poorly currently in regard to his wound. He has been tolerating the dressing changes. Unfortunately he has not had any compression wraps on for the past week because he was unable to make it in for his appointment last week. With that being said he has a significant amount of drainage he tells me has been using his pumps but despite this in the pumps he still has been draining quite a bit. The drainage is also somewhat purulent unfortunately. We did attempt to get in touch with his cardiologist last week unfortunately we were unable to get up with him I did advise that the patient needs to get in touch with him upon leaving today in order to make sure they know he is on the new antibiotics I am going to send him this will be Levaquin and Augmentin. 07-09-2022 upon evaluation today patient appears to be doing about the same in regard to his legs he may have just a slight amount of improvement with regard to the drainage probably Keystone topical antibiotics are helping in this regard to some degree. Fortunately there does not appear to be any signs of active infection systemically which is great news. No fevers, chills, nausea, vomiting, or diarrhea. 07-16-2022 upon evaluation today patient appears  to be doing well currently in regard to his wound. He has been tolerating the dressing changes without complication. Fortunately there does not appear to be any signs of active infection locally nor systemically at this time. With that being said he cannot keep the wraps up he tells me on the left side he had to cut this down because it got too tight. He has been using his pumps but he is on the right side the wrap actually straight down causing some pushing around the central part of his leg just below the calf I think this is a bigger risk for him that help at this point. I think that we may need to try something different he should be getting his compression socks shortly he tells me they were ordered last Thursday. 3/6; ; this is a patient who lives in [place]. He has severe bilateral lymphedema. He has compression pumps, we have been using kerlix Ace wrap Keystone. He is changing the dressing. We do not have home health. 08-06-2022 upon evaluation today patient appears to be doing a little better in regard to his wounds in general at this point. Fortunately there does not appear to be any signs of active infection locally nor systemically  at this time which is great news and overall I am extremely pleased with where we stand today. 08-13-2022 upon evaluation today patient appears to actually be doing significantly better compared to last week. He actually did go to the hospital I told him that he needed to when he left here and he actually did go. With that being said they actually ended up admitting him he was having shortness of breath and I thought it might be related to congestive heart failure turns out he actually had a pulmonary embolism. Subsequently they were able to get him off of the Coumadin switching over to Eliquis to get things stabilized in that regard they also had them wrapped and got his swelling under control on his legs he actually looks much better pretty much across the board  at this point. I am very pleased in that regard. With that being said I am very happy that he finally went that could have been a very dangerous situation. [name], [name] ([id number]) [id number] Page 9 of 12 Patient History Family History Diabetes - Child, No family history of Cancer, Heart Disease, Hereditary Spherocytosis, Hypertension, Kidney Disease, Lung Disease, Seizures, Stroke, Thyroid Problems, Tuberculosis. Social History Never smoker, Marital Status - Married, Alcohol Use - Rarely, Drug Use - No History, Caffeine Use - Daily - coffee. Medical History Eyes Denies history of Cataracts, Glaucoma, Optic Neuritis Hematologic/Lymphatic Patient has history of Anemia - iron, Lymphedema Cardiovascular Patient has history of Deep Vein Thrombosis, Hypertension Oncologic Patient has history of Received Radiation Hospitalization/Surgery History - hydrocele excision left 09/18/2021. - inguinal hernia repair 01/29/2021. - 08/06/2022 inpatient x5 days CHF, HTN, PE. Medical A Surgical History Notes nd Cardiovascular varicose veins Genitourinary stageIIIA CKD Oncologic prostate cancer Objective Constitutional Chronically ill appearing but in no apparent acute distress. Vitals Time Taken: 12:55 PM, Height: 74 in, Weight: 250 lbs, BMI: 32.1, Temperature: 97.7 F, Pulse: 94 bpm, Respiratory Rate: 18 breaths/min, Blood Pressure: 179/55 mmHg. Respiratory normal breathing without difficulty. Psychiatric this patient is able to make decisions and demonstrates good insight into disease process. Alert and Oriented x 3. pleasant and cooperative. General Notes: Upon inspection patient's wound bed actually showed signs of good granulation and epithelization in fact he is doing significantly better compared to last time I saw him I am extremely pleased again he did have quite an adventure it sounds like with 5 days in the hospital but it was absolutely necessary I am glad  that he went. Integumentary (Hair, Skin) Wound #6 status is Open. Original cause of wound was Gradually Appeared. The date acquired was: 03/05/2022. The wound has been in treatment 20 [name]. The wound is located on the Right,Medial Lower Leg. The wound measures 9cm length x 4.2cm width x 0.1cm depth; 29.688cm^2 area and 2.969cm^3 volume. There is Fat Layer (Subcutaneous Tissue) exposed. There is no tunneling or undermining noted. There is a large amount of serosanguineous drainage noted. The wound margin is distinct with the outline attached to the wound base. There is no granulation within the wound bed. There is a large (67-100%) amount of necrotic tissue within the wound bed including Adherent Slough. The periwound skin appearance exhibited: Scarring, Hemosiderin Staining. The periwound skin appearance did not exhibit: Callus, Crepitus, Excoriation, Induration, Rash, Dry/Scaly, Maceration, Atrophie Blanche, Cyanosis, Ecchymosis, Mottled, Pallor, Rubor, Erythema. Wound #7 status is Open. Original cause of wound was Gradually Appeared. The date acquired was: 03/05/2022. The wound has been in treatment 20 [name]. The wound is located on the Left,Circumferential Lower Leg.  The wound measures 8.8cm length x 26.5cm width x 0.1cm depth; 183.155cm^2 area and 18.315cm^3 volume. There is Fat Layer (Subcutaneous Tissue) exposed. There is a medium amount of serosanguineous drainage noted. The wound margin is distinct with the outline attached to the wound base. There is a large (67-100%) amount of necrotic tissue within the wound bed including Adherent Slough. The periwound skin appearance exhibited: Scarring, Maceration, Hemosiderin Staining. The periwound skin appearance did not exhibit: Callus, Crepitus, Excoriation, Induration, Rash, Dry/Scaly, Atrophie Blanche, Cyanosis, Ecchymosis, Mottled, Pallor, Rubor, Erythema. 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 There was a Excisional Skin/Subcutaneous Tissue Debridement with a total area of 27 sq cm performed by Worthy Keeler, PA. With the following instrument(s): Curette to remove Viable and Non-Viable tissue/material. Material removed includes Subcutaneous Tissue, Slough, and Biofilm after achieving pain control using Lidocaine 4% Topical Solution. A time out was conducted at 13:25, prior to the start of the procedure. A Minimum amount of bleeding was controlled with Pressure. The procedure was tolerated well with a pain level of 0 throughout and a pain level of 0 following the procedure. Post Debridement Measurements: 9cm length x 4.2cm width x 0.1cm depth; 2.969cm^3 volume. Character of Wound/Ulcer Post Debridement is improved. Post procedure Diagnosis Wound #6: Same as Pre-Procedure Wound #7 Pre-procedure diagnosis of Wound #7 is a Lymphedema located on the Left,Circumferential Lower Leg . There was a Excisional Skin/Subcutaneous Tissue Debridement with a total area of 16 sq cm performed by Worthy Keeler, PA. With the following instrument(s): Curette to remove Viable and Non-Viable tissue/material. Material removed includes Subcutaneous Tissue, Slough, and Biofilm after achieving pain control using Lidocaine 4% Topical Solution. A time out was conducted at 13:25, prior to the start of the procedure. A Minimum amount of bleeding was controlled with Pressure. The procedure was tolerated well with a pain level of 0 throughout and a pain level of 0  following the procedure. Post Debridement Measurements: 8.8cm length x 26.5cm width x 0.1cm depth; 18.315cm^3 volume. Character of Wound/Ulcer Post Debridement is improved. Post procedure Diagnosis Wound #7: Same as Pre-Procedure Plan Follow-up Appointments: Return Appointment in 1 week. Margarita Grizzle Wednesday 08/20/2022 1230 Return Appointment in 2 [name]. Margarita Grizzle Wednesday 08/27/2022 1245 Other: - ***Change daily. ***** Anesthetic: (In clinic) Topical Lidocaine 5% applied to wound bed Bathing/ Shower/ Hygiene: May shower with protection but do not get wound dressing(s) wet. Protect dressing(s) with water repellant cover (for example, large plastic bag) or a cast cover and may then take shower. Edema Control - Lymphedema / SCD / Other: Lymphedema Pumps. Use Lymphedema pumps on leg(s) 2-3 times a day for 45-60 minutes. If wearing any wraps or hose, do not remove them. Continue exercising as instructed. - *****pump 3 times a day for an hour each time.***** Elevate legs to the level of the heart or above for 30 minutes daily and/or when sitting for 3-4 times a day throughout the day. - important Avoid standing for long periods of time. Exercise regularly Compression stocking or Garment 30-40 mm/Hg pressure to: - juxtafit Essentials Long for bilateral lower legs; quantity three juxtafit  essentials per limb. WOUND #6: - Lower Leg Wound Laterality: Right, Medial Cleanser: Soap and Water 1 x Per Day/30 Days Discharge Instructions: May shower and wash wound with dial antibacterial soap and water prior to dressing change. Peri-Wound Care: Sween Lotion (Moisturizing lotion) 1 x Per Day/30 Days Discharge Instructions: Apply moisturizing lotion as directed Prim Dressing: Maxorb Extra Ag+ Alginate Dressing, 4x4.75 (in/in) 1 x Per Day/30 Days ary Discharge Instructions: Apply to wound bed as instructed Prim Dressing: Compounding topical antibiotics- [place] 1 x Per Day/30 Days ary Discharge  Instructions: apply under alignate Ag when patient brings with appts. Secondary Dressing: ABD Pad, 5x9 1 x Per Day/30 Days Discharge Instructions: Apply over primary dressing as directed. Secondary Dressing: Woven Gauze Sponge, Non-Sterile 4x4 in 1 x Per Day/30 Days Discharge Instructions: Apply over primary dressing as directed. Secured With: [employer], 4.5x3.1 (in/yd) 1 x Per Day/30 Days Discharge Instructions: Secure with Kerlix as directed. Secured With: 5M Medipore H Soft Cloth Surgical T ape, 4 x 10 (in/yd) 1 x Per Day/30 Days Discharge Instructions: Secure with tape as directed. Com pression Wrap: Juxtafit 30-39mmHg 1 x Per Day/30 Days Discharge Instructions: apply in the morning and remove at night. WOUND #7: - Lower Leg Wound Laterality: Left, Circumferential Cleanser: Soap and Water 1 x Per Day/30 Days Discharge Instructions: May shower and wash wound with dial antibacterial soap and water prior to dressing change. Peri-Wound Care: Sween Lotion (Moisturizing lotion) 1 x Per Day/30 Days Discharge Instructions: Apply moisturizing lotion as directed Prim Dressing: Maxorb Extra Ag+ Alginate Dressing, 4x4.75 (in/in) 1 x Per Day/30 Days ary Discharge Instructions: Apply to wound bed as instructed Prim Dressing: Compounding topical antibiotics- [place] 1 x Per Day/30 Days ary Discharge Instructions: apply under alignate Ag when patient brings with appts. Secondary Dressing: ABD Pad, 5x9 1 x Per Day/30 Days Discharge Instructions: Apply over primary dressing as directed. Secondary Dressing: Woven Gauze Sponge, Non-Sterile 4x4 in 1 x Per Day/30 Days Discharge Instructions: Apply over primary dressing as directed. Secured With: [employer], 4.5x3.1 (in/yd) 1 x Per Day/30 Days Discharge Instructions: Secure with Kerlix as directed. Secured With: 5M Medipore H Soft Cloth Surgical T ape, 4 x 10 (in/yd) 1 x Per Day/30 Days Discharge Instructions: Secure with tape  as directed. Com pression Wrap: Juxtafit 30-77mmHg 1 x Per Day/30 Days Discharge Instructions: apply in the morning and remove at night. Antonini, [name] ([id number]) [id number] Page 11 of 12 1. I would recommend currently that we have the patient continue to utilize his lymphedema pumps I think that is still appropriate as can help keep the swelling under control. 2 also can recommend that he should continue to monitor for any signs of infection or worsening. If anything changes he knows contact the office let me know but otherwise I think at this point he is in a much better place and hopefully we can get his wounds completely healed they already look at least 50 to 70% better today. We will see patient back for reevaluation in 1 week here in the clinic. If anything worsens or changes patient will contact our office for additional recommendations. Electronic Signature(s) Signed: 08/13/2022 1:38:37 PM By: Worthy Keeler [name] Entered By: Worthy [name] on 08/13/2022 13:38:37 -------------------------------------------------------------------------------- HxROS Details Patient Name: Date of Service: [name], [name] 08/13/2022 12:45 PM Medical Record Number: [id number] Patient Account Number: [id number] Date of Birth/Sex: Treating RN: Jan 16, 1946 (77 y.o. [name] Primary Care Provider: Birdie [name] Other Clinician: Referring  Provider: Treating Provider/Extender: [name], [name] [name] in Treatment: 20 Eyes Medical History: Negative for: Cataracts; Glaucoma; Optic Neuritis Hematologic/Lymphatic Medical History: Positive for: Anemia - iron; Lymphedema Cardiovascular Medical History: Positive for: Deep Vein Thrombosis; Hypertension Past Medical History Notes: varicose veins Genitourinary Medical History: Past Medical History Notes: stageIIIA CKD Oncologic Medical History: Positive for: Received Radiation Past Medical History  Notes: prostate cancer Immunizations Pneumococcal Vaccine: Received Pneumococcal Vaccination: Yes Received Pneumococcal Vaccination On or After 60th Birthday: Yes Implantable Devices No devices added Hospitalization / Surgery History Type of Hospitalization/Surgery hydrocele excision left 09/18/2021 inguinal hernia repair 01/29/2021 08/06/2022 inpatient x5 days CHF, HTN, PE Family and Social History Cancer: No; 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; Heart Disease: No; Hereditary Spherocytosis: No; Hypertension: No; Kidney Disease: No; Lung Disease: No; Seizures: No; Stroke: No; Thyroid Problems: No; Tuberculosis: No; Never smoker; Marital Status - Married; Alcohol Use: Rarely; Drug Use: No History; Caffeine Use: Daily - coffee; Financial Concerns: No; Food, Clothing or Shelter Needs: No; Support System Lacking: No; Transportation Concerns: No Ortwein, [name] ([id number]) [id number] Page 12 of 12 Electronic Signature(s) Unsigned Entered By: [name] on 08/13/2022 13:25:07 -------------------------------------------------------------------------------- SuperBill Details Patient Name: Date of Service: Bora, [name] 08/13/2022 Medical Record Number: [id number] Patient Account Number: [id number] Date of Birth/Sex: Treating RN: 22-Oct-1945 (77 y.o. [name], [name] Primary Care Provider: Birdie [name] Other Clinician: Referring Provider: Treating Provider/Extender: [name] in Treatment: 20 Diagnosis Coding ICD-10 Codes Code Description [phone] Chronic venous hypertension (idiopathic) with ulcer and inflammation of bilateral lower extremity I89.0 Lymphedema, not elsewhere classified 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 other part of right lower leg with other specified severity Facility Procedures : CPT4 Code: [id number] Description: [id number] -  DEB SUBQ TISSUE 20 SQ CM/< ICD-10 Diagnosis Description L97.828 Non-pressure chronic ulcer of other part of left lower leg with other specified 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 other part of right lower leg with other specified Modifier: severity severity Quantity: 1 : CPT4 Code: [id number] Description: [id number] - DEB SUBQ TISS EA ADDL 20CM ICD-10 Diagnosis Description 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 other part of right lower leg with other specified L97.828 Non-pressure chronic ulcer of other part of left lower leg with other specified Modifier: severity severity Quantity: 2 Physician Procedures : CPT4 Code Description Modifier [id number] - WC PHYS SUBQ TISS 20 SQ CM ICD-10 Diagnosis Description 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 Quantity: 1 : [id number] - WC PHYS SUBQ TISS EA ADDL 20 CM ICD-10 Diagnosis Description 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 other part of right lower leg with other specified severity L97.828 Non-pressure chronic ulcer of other part of left lower leg with other  specified severity Quantity: 2 Electronic Signature(s) Signed: 08/13/2022 1:38:57 PM By: Worthy Keeler [name] Entered By: Worthy [name] on 08/13/2022 13:38:56

## 2022-08-19 NOTE — Progress Notes (Signed)
[name] [name] [name] ([id number] Page 1 of 7 Visit Report for 08/13/2022 Arrival Information Details Patient Name: Date of Service: [name] [name] 08/13/2022 12:45 PM Medical Record Number: [id number] Patient Account Number: [id number] Date of Birth/Sex: Treating RN: 01-07-1946 (77 y.o. M) Primary Care [name] [name]: [name] [name] Other Clinician: Donavan [name] Referring [name] [name]: Treating [name] [name]/Extender: [name] [name] in Treatment: 20 Visit Information History Since Last Visit All ordered tests and consults were completed: Yes Patient Arrived: Cane Added or deleted any medications: Yes Arrival Time: 12:32 Any new allergies or adverse reactions: No Accompanied By: self Had a fall or experienced change in No Transfer Assistance: None activities of daily living that may affect Patient Identification Verified: Yes risk of falls: Secondary Verification Process Completed: Yes Signs or symptoms of abuse/neglect since last visito No Patient Requires Transmission-Based Precautions: No Hospitalized since last visit: Yes Patient Has Alerts: Yes Implantable device outside of the clinic excluding No Patient Alerts: Patient on Blood Thinner cellular tissue based products placed in the center Right ABI in clinic [place] since last visit: Pain Present Now: No Electronic Signature(s) Signed: 08/18/2022 8:57:55 PM By: [name] [name] CHT EMT BS , , Entered By: [name] [name] on 08/13/2022 12:57:17 -------------------------------------------------------------------------------- Encounter Discharge Information Details Patient Name: Date of Service: [name] [name] 08/13/2022 12:45 PM Medical Record Number: [id number] Patient Account Number: [id number] Date of Birth/Sex: Treating RN: 1946/02/16 (77 y.o. [name] [name] Primary Care Dekayla Prestridge: [name] [name] Other Clinician: Referring [name] [name]: Treating [name] [name]/Extender: [name] [name] Weeks in Treatment: 20 Encounter Discharge Information Items Post Procedure Vitals Discharge Condition: Stable Temperature (F): 97.7 Ambulatory Status: Cane Pulse (bpm): 94 Discharge Destination: Home Respiratory Rate (breaths/min): 20 Transportation: Private Auto Blood Pressure (mmHg): 179/55 Accompanied By: self Schedule Follow-up Appointment: Yes Clinical Summary of Care: Electronic Signature(s) Signed: 08/13/2022 5:29:18 PM By: [name] Pilling RN, [name] Entered By: [name] [name] on 08/13/2022 13:33:39 -------------------------------------------------------------------------------- Lower Extremity Assessment Details Patient Name: Date of Service: [name] [name] 08/13/2022 12:45 PM Medical Record Number: [id number] Patient Account Number: [id number] Date of Birth/Sex: Treating RN: [birth year] (77 y.o. M) Primary Care Aldena Worm: [name] [name] Other Clinician: Referring Geneen Dieter: Treating Najia Hurlbutt/Extender: [name] [name] Weeks in Treatment: 20 Edema Assessment Assessed: [Left: No] [Right: No] J[Left: [name] [name] [id number] [[id number] Page 2 of 7] Edema: [Left: Yes] [Right: Yes] Calf Left: Right: Point of Measurement: 40 cm From Medial Instep 54 cm 67.5 cm Ankle Left: Right: Point of Measurement: 13 cm From Medial Instep 43.5 cm 43.3 cm Electronic Signature(s) Signed: 08/18/2022 8:57:55 PM By: [name] [name] CHT EMT BS , , Entered By: [name] [name] on 08/13/2022 13:05:31 -------------------------------------------------------------------------------- Multi-Disciplinary Care Plan Details Patient Name: Date of Service: [name] [name] 08/13/2022 12:45 PM Medical Record Number: [id number] Patient Account Number: [id number] Date of Birth/Sex: Treating RN: 05/26/1945 (77 y.o. [name] [name] Primary Care Junelle Hashemi: [name] [name] Other Clinician: Referring Carmellia Kreisler: Treating  Tenea Sens/Extender: [name] [name] Weeks in Treatment: 20 Active Inactive Wound/Skin Impairment Nursing Diagnoses: Impaired tissue integrity Knowledge deficit related to ulceration/compromised skin integrity Goals: Patient will have a decrease in wound volume by X% from date: (specify in notes) Date Initiated: 03/26/2022 Target Resolution Date: 09/19/2022 Goal Status: Active Patient/caregiver will verbalize understanding of skin care regimen Date Initiated: 03/26/2022 Target Resolution Date: 09/20/2022 Goal Status: Active Ulcer/skin breakdown will have a volume reduction of 30% by week 4 Date Initiated: 03/26/2022 Date Inactivated: 05/21/2022 Target Resolution Date: 05/17/2022 Unmet  Reason: see wound Goal Status: Unmet measurement. Ulcer/skin breakdown will have a volume reduction of 50% by week 8 Date Initiated: 03/26/2022 Date Inactivated: 05/21/2022 Target Resolution Date: 05/17/2022 Unmet Reason: see wound Goal Status: Unmet measurement. Interventions: Assess patient/caregiver ability to obtain necessary supplies Assess patient/caregiver ability to perform ulcer/skin care regimen upon admission and as needed Assess ulceration(s) every visit Notes: Patient stated today, "I will take my fluid pill or pump not do both." [name] [name] made aware. Electronic Signature(s) Signed: 08/13/2022 5:29:18 PM By: [name] Pilling RN, [name] Entered By: [name] [name] on 08/13/2022 13:14:56 -------------------------------------------------------------------------------- Pain Assessment Details Patient Name: Date of Service: [name] [name] 08/13/2022 12:45 PM Medical Record Number: [id number] Patient Account Number: [id number] Date of Birth/Sex: Treating RN: 24-Jun-1945 (77 y.o. [name] [name] Primary Care Samira Acero: [name] [name] Other Clinician: [place], [name] [name] ([id number]) [id number] Page 3 of 7 Referring Everette Mall: Treating [name] [name]/Extender: [name] [name] Weeks in Treatment: 20 Active Problems Location of Pain Severity and Description of Pain Patient Has Paino No Site Locations Pain Management and Medication Current Pain Management: Electronic Signature(s) Signed: 08/13/2022 5:29:18 PM By: [name] Pilling RN, [name] Entered By: [name] [name] on 08/13/2022 13:14:06 -------------------------------------------------------------------------------- Patient/Caregiver Education Details Patient Name: Date of Service: [name] [name] 3/27/2024andnbsp12:45 PM Medical Record Number: [id number] Patient Account Number: [id number] Date of Birth/Gender: Treating RN: 1945-12-09 (77 y.o. [name] [name] Primary Care Physician: [name] [name] Other Clinician: Referring Physician: Treating Physician/Extender: [name] [name] in Treatment: 20 Education Assessment Education Provided To: Patient Education Topics Provided Wound/Skin Impairment: Handouts: Caring for Your Ulcer Methods: Explain/Verbal Responses: Reinforcements needed Electronic Signature(s) Signed: 08/13/2022 5:29:18 PM By: [name] Pilling RN, [name] Entered By: [name] [name] on 08/13/2022 13:15:33 -------------------------------------------------------------------------------- Wound Assessment Details Patient Name: Date of Service: [name] [name] 08/13/2022 12:45 PM Medical Record Number: [id number] Patient Account Number: [id number] Date of Birth/Sex: Treating RN: 02-24-1946 (77 y.o. M) Primary Care Telsa Dillavou: [name] [name] Other Clinician: Referring Jurni Cesaro: Treating Korbyn Chopin/Extender: [name] [name] [place], [name] [name] ([id number]) [id number] Page 4 of 7 Weeks in Treatment: 20 Wound Status Wound Number: 6 Primary Lymphedema Etiology: Wound Location: Right, Medial Lower Leg Wound Open Wounding Event: Gradually Appeared Status: Date Acquired: 03/05/2022 Comorbid Anemia, Lymphedema, Deep Vein  Thrombosis, Hypertension, Weeks Of Treatment: 20 History: Received Radiation Clustered Wound: Yes Photos Wound Measurements Length: (cm) 9 Width: (cm) 4.2 Depth: (cm) 0.1 Clustered Quantity: 2 Area: (cm) 29.688 Volume: (cm) 2.969 % Reduction in Area: 79.2% % Reduction in Volume: 79.2% Epithelialization: Small (1-33%) Tunneling: No Undermining: No Wound Description Classification: Full Thickness Without Exposed Sup Wound Margin: Distinct, outline attached Exudate Amount: Large Exudate Type: Serosanguineous Exudate Color: red, brown port Structures Foul Odor After Cleansing: No Slough/Fibrino Yes Wound Bed Granulation Amount: None Present (0%) Exposed Structure Necrotic Amount: Large (67-100%) Fascia Exposed: No Necrotic Quality: Adherent Slough Fat Layer (Subcutaneous Tissue) Exposed: Yes Tendon Exposed: No Muscle Exposed: No Joint Exposed: No Bone Exposed: No Periwound Skin Texture Texture Color No Abnormalities Noted: No No Abnormalities Noted: No Callus: No Atrophie Blanche: No Crepitus: No Cyanosis: No Excoriation: No Ecchymosis: No Induration: No Erythema: No Rash: No Hemosiderin Staining: Yes Scarring: Yes Mottled: No Pallor: No Moisture Rubor: No No Abnormalities Noted: No Dry / Scaly: No Maceration: No Treatment Notes Wound #6 (Lower Leg) Wound Laterality: Right, Medial Cleanser Soap and Water Discharge Instruction: May shower and wash wound with dial antibacterial soap and water prior to dressing change. Peri-Wound Care Sween Lotion (Moisturizing  lotion) Discharge Instruction: Apply moisturizing lotion as directed [place], [name] [name] ([id number]) [id number].pdf Page 5 of 7 Topical Primary Dressing Maxorb Extra Ag+ Alginate Dressing, 4x4.75 (in/in) Discharge Instruction: Apply to wound bed as instructed Compounding topical antibiotics- [facility] Discharge Instruction: apply under alignate Ag when patient brings  with appts. Secondary Dressing ABD Pad, 5x9 Discharge Instruction: Apply over primary dressing as directed. Woven Gauze Sponge, Non-Sterile 4x4 in Discharge Instruction: Apply over primary dressing as directed. Secured With [employer], 4.5x3.1 (in/yd) Discharge Instruction: Secure with Kerlix as directed. 87M Medipore H Soft Cloth Surgical T ape, 4 x 10 (in/yd) Discharge Instruction: Secure with tape as directed. Compression Wrap Juxtafit 30-84mmHg Discharge Instruction: apply in the morning and remove at night. Compression Stockings Add-Ons Electronic Signature(s) Signed: 08/18/2022 8:57:55 PM By: [name] [name] CHT EMT BS , , Entered By: [name] [name] on 08/13/2022 13:21:56 -------------------------------------------------------------------------------- Wound Assessment Details Patient Name: Date of Service: [name] [name] 08/13/2022 12:45 PM Medical Record Number: [id number] Patient Account Number: [id number] Date of Birth/Sex: Treating RN: Nov 13, 1945 (77 y.o. M) Primary Care Moorea Boissonneault: [name] [name] Other Clinician: Referring Lynnelle Mesmer: Treating Mashell Sieben/Extender: [name] [name] Weeks in Treatment: 20 Wound Status Wound Number: 7 Primary Lymphedema Etiology: Wound Location: Left, Circumferential Lower Leg Wound Open Wounding Event: Gradually Appeared Status: Date Acquired: 03/05/2022 Comorbid Anemia, Lymphedema, Deep Vein Thrombosis, Hypertension, Weeks Of Treatment: 20 History: Received Radiation Clustered Wound: Yes Photos Wound Measurements Length: (cm) 8.8 Width: (cm) 26.5 Depth: (cm) 0.1 Clustered Quantity: 4 Area: (cm) 183.155 [name], Elvie ([id number]) Volume: (cm) 18.315 % Reduction in Area: -288.7% % Reduction in Volume: -288.7% Epithelialization: None [id number] Page 6 of 7 Wound Description Classification: Full Thickness Without Exposed Suppo Wound Margin: Distinct, outline  attached Exudate Amount: Medium Exudate Type: Serosanguineous Exudate Color: red, brown rt Structures Foul Odor After Cleansing: No Slough/Fibrino Yes Wound Bed Necrotic Amount: Large (67-100%) Exposed Structure Necrotic Quality: Adherent Slough Fascia Exposed: No Fat Layer (Subcutaneous Tissue) Exposed: Yes Tendon Exposed: No Muscle Exposed: No Joint Exposed: No Bone Exposed: No Periwound Skin Texture Texture Color No Abnormalities Noted: No No Abnormalities Noted: No Callus: No Atrophie Blanche: No Crepitus: No Cyanosis: No Excoriation: No Ecchymosis: No Induration: No Erythema: No Rash: No Hemosiderin Staining: Yes Scarring: Yes Mottled: No Pallor: No Moisture Rubor: No No Abnormalities Noted: No Dry / Scaly: No Maceration: Yes Treatment Notes Wound #7 (Lower Leg) Wound Laterality: Left, Circumferential Cleanser Soap and Water Discharge Instruction: May shower and wash wound with dial antibacterial soap and water prior to dressing change. Peri-Wound Care Sween Lotion (Moisturizing lotion) Discharge Instruction: Apply moisturizing lotion as directed Topical Primary Dressing Maxorb Extra Ag+ Alginate Dressing, 4x4.75 (in/in) Discharge Instruction: Apply to wound bed as instructed Compounding topical antibiotics- [facility] Discharge Instruction: apply under alignate Ag when patient brings with appts. Secondary Dressing ABD Pad, 5x9 Discharge Instruction: Apply over primary dressing as directed. Woven Gauze Sponge, Non-Sterile 4x4 in Discharge Instruction: Apply over primary dressing as directed. Secured With [employer], 4.5x3.1 (in/yd) Discharge Instruction: Secure with Kerlix as directed. 87M Medipore H Soft Cloth Surgical T ape, 4 x 10 (in/yd) Discharge Instruction: Secure with tape as directed. Compression Wrap Juxtafit 30-79mmHg Discharge Instruction: apply in the morning and remove at night. Compression  Stockings Add-Ons Electronic Signature(s) Corker, [name] [name] ([id number]) [id number] Page 7 of 7 Signed: 08/18/2022 8:57:55 PM By: [name] [name] CHT EMT BS , , Entered By: [name] [name] on 08/13/2022 13:22:17 -------------------------------------------------------------------------------- Vitals Details Patient Name: Date of Service: [name] [name]  [name] 08/13/2022 12:45 PM Medical Record Number: [id number] Patient Account Number: [id number] Date of Birth/Sex: Treating RN: 10-02-1945 (77 y.o. M) Primary Care Nashly Olsson: [name] [name] Other Clinician: Referring Mykel Mohl: Treating Bryson Palen/Extender: [name] [name] Weeks in Treatment: 20 Vital Signs Time Taken: 12:55 Temperature (F): 97.7 Height (in): 74 Pulse (bpm): 94 Weight (lbs): 250 Respiratory Rate (breaths/min): 18 Body Mass Index (BMI): 32.1 Blood Pressure (mmHg): 179/55 Reference Range: 80 - 120 mg / dl Electronic Signature(s) Signed: 08/18/2022 8:57:55 PM By: [name] [name] CHT EMT BS , , Entered By: [name] [name] on 08/13/2022 12:57:45

## 2022-08-20 ENCOUNTER — Encounter (HOSPITAL_BASED_OUTPATIENT_CLINIC_OR_DEPARTMENT_OTHER): Payer: Medicare Other | Attending: Physician Assistant | Admitting: Physician Assistant

## 2022-08-20 DIAGNOSIS — Z923 Personal history of irradiation: Secondary | ICD-10-CM | POA: Insufficient documentation

## 2022-08-20 DIAGNOSIS — I890 Lymphedema, not elsewhere classified: Secondary | ICD-10-CM | POA: Insufficient documentation

## 2022-08-20 DIAGNOSIS — L97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 other part of right lower leg with other specified severity: Secondary | ICD-10-CM | POA: Insufficient documentation

## 2022-08-20 DIAGNOSIS — I5032 Chronic diastolic (congestive) heart failure: Secondary | ICD-10-CM | POA: Insufficient documentation

## 2022-08-20 DIAGNOSIS — Z7901 Long term (current) use of anticoagulants: Secondary | ICD-10-CM | POA: Diagnosis not present

## 2022-08-20 DIAGNOSIS — N189 Chronic kidney disease, unspecified: Secondary | ICD-10-CM | POA: Diagnosis not present

## 2022-08-20 DIAGNOSIS — Z8546 Personal history of malignant neoplasm of prostate: Secondary | ICD-10-CM | POA: Diagnosis not present

## 2022-08-20 DIAGNOSIS — I130 Hypertensive heart and chronic kidney disease with heart failure and stage 1 through stage 4 chronic kidney disease, or unspecified chronic kidney disease: Secondary | ICD-10-CM | POA: Diagnosis not present

## 2022-08-20 DIAGNOSIS — Z86718 Personal history of other venous thrombosis and embolism: Secondary | ICD-10-CM | POA: Diagnosis not present

## 2022-08-20 DIAGNOSIS — I87333 Chronic venous hypertension (idiopathic) with ulcer and inflammation of bilateral lower extremity: Secondary | ICD-10-CM | POA: Insufficient documentation

## 2022-08-20 DIAGNOSIS — I872 Venous insufficiency (chronic) (peripheral): Secondary | ICD-10-CM | POA: Insufficient documentation

## 2022-08-20 DIAGNOSIS — L97828 Non-pressure chronic ulcer of other part of left lower leg with other specified severity: Secondary | ICD-10-CM | POA: Diagnosis not present

## 2022-08-20 NOTE — Progress Notes (Signed)
[name] [name], [name] [name] ([id number]) [phone].pdf Page 1 of 9 Visit Report for 08/20/2022 Arrival Information Details Patient Name: Date of Service: [name] [name] 08/20/2022 12:30 PM Medical Record Number: [id number] Patient Account Number: [id number] Date of Birth/Sex: Treating RN: 1946/04/16 (77 y.o. [name] [name] Primary Care Cochise Dinneen: [name] Other Clinician: Referring Yentl Verge: Treating Penny Frisbie/Extender: [name] [name] in Treatment: 21 Visit Information History Since Last Visit Added or deleted any medications: No Patient Arrived: [name] [name] Any new allergies or adverse reactions: No Arrival Time: 12:40 Had a fall or experienced change in No Accompanied By: wife activities of daily living that may affect Transfer Assistance: None risk of falls: Patient Identification Verified: Yes Signs or symptoms of abuse/neglect since last visito No Secondary Verification Process Completed: Yes Hospitalized since last visit: No Patient Requires Transmission-Based Precautions: No Implantable device outside of the clinic excluding No Patient Has Alerts: Yes cellular tissue based products placed in the center Patient Alerts: Patient on Blood Thinner since last visit: Right ABI in clinic [place] Has Dressing in Place as Prescribed: Yes Has Compression in Place as Prescribed: Yes Pain Present Now: No Electronic Signature([name]) Signed: 08/20/2022 4:55:19 PM By: [name], [name] Entered By: Deon Pilling on 08/20/2022 12:50:28 -------------------------------------------------------------------------------- Encounter Discharge Information Details Patient Name: Date of Service: [name] [name] 08/20/2022 12:30 PM Medical Record Number: [id number] Patient Account Number: [id number] Date of Birth/Sex: Treating RN: 01-Jul-1945 (76 y.o. [name] [name] Primary Care Eldene Plocher: [name] Other Clinician: Referring Billie Intriago: Treating Bera Pinela/Extender:  [name] Weeks in Treatment: 21 Encounter Discharge Information Items Post Procedure Vitals Discharge Condition: Stable Temperature (F): 98.2 Ambulatory Status: Cane Pulse (bpm): 94 Discharge Destination: Home Respiratory Rate (breaths/min): 20 Transportation: Private Auto Blood Pressure (mmHg): 182/68 Accompanied By: wife Schedule Follow-up Appointment: Yes Clinical Summary of Care: Electronic Signature([name]) Signed: 08/20/2022 4:55:19 PM By: [name], [name] Entered By: Deon Pilling on 08/20/2022 13:04:47 -------------------------------------------------------------------------------- Lower Extremity Assessment Details Patient Name: Date of Service: [name] [name] 08/20/2022 12:30 PM Medical Record Number: [id number] Patient Account Number: [id number] Date of Birth/Sex: Treating RN: 09/05/1945 (76 y.o. [name] [name] Primary Care Matsue Strom: [name] Other Clinician: Referring Keshon Markovitz: Treating Eleanor Gatliff/Extender: [name], [name] Weeks in Treatment: 21 Edema Assessment J[Left: [name], [name] [name] [id number] Patrice [id number].pdf Page 2 of 9] Assessed: [Left: Yes] [Right: Yes] Edema: [Left: Yes] [Right: Yes] Calf Left: Right: Point of Measurement: 40 cm From Medial Instep 52 cm 59 cm Ankle Left: Right: Point of Measurement: 13 cm From Medial Instep 42 cm 42 cm Vascular Assessment Pulses: Dorsalis Pedis Palpable: [Left:Yes] [Right:Yes] Electronic Signature([name]) Signed: 08/20/2022 4:55:19 PM By: [name], [name] Entered By: Deon Pilling on 08/20/2022 12:51:35 -------------------------------------------------------------------------------- Multi-Disciplinary Care Plan Details Patient Name: Date of Service: [name] [name] 08/20/2022 12:30 PM Medical Record Number: [id number] Patient Account Number: [id number] Date of Birth/Sex: Treating RN: Mar 19, 1946 (77 y.o. [name] [name] Primary Care Mireyah Chervenak:  [name] Other Clinician: Referring Khali Albanese: Treating Izaiyah Kleinman/Extender: [name] Weeks in Treatment: 21 Active Inactive Wound/Skin Impairment Nursing Diagnoses: Impaired tissue integrity Knowledge deficit related to ulceration/compromised skin integrity Goals: Patient will have a decrease in wound volume by X% from date: (specify in notes) Date Initiated: 03/26/2022 Target Resolution Date: 09/19/2022 Goal Status: Active Patient/caregiver will verbalize understanding of skin care regimen Date Initiated: 03/26/2022 Target Resolution Date: 09/20/2022 Goal Status: Active Ulcer/skin breakdown will have a volume reduction of 30% by week 4 Date  Initiated: 03/26/2022 Date Inactivated: 05/21/2022 Target Resolution Date: 05/17/2022 Unmet Reason: see wound Goal Status: Unmet measurement. Ulcer/skin breakdown will have a volume reduction of 50% by week 8 Date Initiated: 03/26/2022 Date Inactivated: 05/21/2022 Target Resolution Date: 05/17/2022 Unmet Reason: see wound Goal Status: Unmet measurement. Interventions: Assess patient/caregiver ability to obtain necessary supplies Assess patient/caregiver ability to perform ulcer/skin care regimen upon admission and as needed Assess ulceration([name]) every visit Notes: Patient stated today, "I will take my fluid pill or pump not do both." Lametria Klunk made aware. Electronic Signature([name]) Signed: 08/20/2022 4:55:19 PM By: [name], [name] Entered By: Deon Pilling on 08/20/2022 12:55:31 [name] [name] ([id number].pdf Page 3 of 9 -------------------------------------------------------------------------------- Pain Assessment Details Patient Name: Date of Service: [name] [name] 08/20/2022 12:30 PM Medical Record Number: [id number] Patient Account Number: [id number] Date of Birth/Sex: Treating RN: 1946-02-01 (77 y.o. [name], [name] Primary Care Karysa Heft: [name] Other  Clinician: Referring Nasya Vincent: Treating Lynnwood Beckford/Extender: [name] Weeks in Treatment: 21 Active Problems Location of Pain Severity and Description of Pain Patient Has Paino No Site Locations Pain Management and Medication Current Pain Management: Medication: No Cold Application: No Rest: No Massage: No Activity: No T.E.N.[name].: No Heat Application: No Leg drop or elevation: No Is the Current Pain Management Adequate: Adequate How does your wound impact your activities of daily livingo Sleep: No Bathing: No Appetite: No Relationship With Others: No Bladder Continence: No Emotions: No Bowel Continence: No Work: No Toileting: No Drive: No Dressing: No Hobbies: No Electronic Signature([name]) Signed: 08/20/2022 4:55:19 PM By: [name], [name] Entered By: Deon Pilling on 08/20/2022 12:50:37 -------------------------------------------------------------------------------- Patient/Caregiver Education Details Patient Name: Date of Service: Morandi, [name] [name] 4/3/2024andnbsp12:30 PM Medical Record Number: [id number] Patient Account Number: [id number] Date of Birth/Gender: Treating RN: [birth year] (77 y.o. [name] [name] Primary Care Physician: [name] Other Clinician: Referring Physician: Treating Physician/Extender: [name] [name] in Treatment: 21 Education Assessment Education Provided To: Patient Education Topics Provided Wound/Skin Impairment: Koren, [name] [name] ([id number]) [id number] Page 4 of 9 Handouts: Caring for Your Ulcer Methods: Explain/Verbal Responses: Reinforcements needed Electronic Signature([name]) Signed: 08/20/2022 4:55:19 PM By: [name], [name] Entered By: Deon Pilling on 08/20/2022 12:55:44 -------------------------------------------------------------------------------- Wound Assessment Details Patient Name: Date of Service: [place], [name] [name] 08/20/2022 12:30 PM Medical  Record Number: [id number] Patient Account Number: [id number] Date of Birth/Sex: Treating RN: 10-06-1945 (77 y.o. [name], [name] Primary Care Madalyne Husk: [name] Other Clinician: Referring Earla Charlie: Treating Venesha Petraitis/Extender: [name] Weeks in Treatment: 21 Wound Status Wound Number: 6 Primary Lymphedema Etiology: Wound Location: Right, Medial Lower Leg Wound Open Wounding Event: Gradually Appeared Status: Date Acquired: 03/05/2022 Comorbid Anemia, Lymphedema, Deep Vein Thrombosis, Hypertension, Weeks Of Treatment: 21 History: Received Radiation Clustered Wound: Yes Photos Wound Measurements Length: (cm) Width: (cm) Depth: (cm) Clustered Quantity: Area: (cm) Volume: (cm) 8.6 % Reduction in Area: 74% 5.5 % Reduction in Volume: 74% 0.1 Epithelialization: Small (1-33%) 1 Tunneling: No 37.149 Undermining: No 3.715 Wound Description Classification: Full Thickness Without Exposed Sup Wound Margin: Distinct, outline attached Exudate Amount: Medium Exudate Type: Serosanguineous Exudate Color: red, brown port Structures Foul Odor After Cleansing: No Slough/Fibrino Yes Wound Bed Granulation Amount: None Present (0%) Exposed Structure Necrotic Amount: Large (67-100%) Fascia Exposed: No Necrotic Quality: Adherent Slough Fat Layer (Subcutaneous Tissue) Exposed: Yes Tendon Exposed: No Muscle Exposed: No Joint Exposed: No Bone Exposed: No Periwound Skin Texture Texture Color No Abnormalities Noted: No No Abnormalities Noted:  No Callus: No Atrophie Blanche: No Crepitus: No Cyanosis: No Excoriation: No Ecchymosis: No Yuhasz, Kyl ([id number]) [phone].pdf Page 5 of 9 Induration: No Erythema: No Rash: No Hemosiderin Staining: Yes Scarring: Yes Mottled: No Pallor: No Moisture Rubor: No No Abnormalities Noted: No Dry / Scaly: No Maceration: No Treatment Notes Wound #6 (Lower Leg) Wound Laterality:  Right, Medial Cleanser Soap and Water Discharge Instruction: May shower and wash wound with dial antibacterial soap and water prior to dressing change. Peri-Wound Care Sween Lotion (Moisturizing lotion) Discharge Instruction: Apply moisturizing lotion as directed Topical Primary Dressing Maxorb Extra Ag+ Alginate Dressing, 4x4.75 (in/in) Discharge Instruction: Apply to wound bed as instructed Compounding topical antibiotics- [facility] Discharge Instruction: apply under alignate Ag when patient brings with appts. Secondary Dressing ABD Pad, 5x9 Discharge Instruction: Apply over primary dressing as directed. Woven Gauze Sponge, Non-Sterile 4x4 in Discharge Instruction: Apply over primary dressing as directed. Secured With [employer], 4.5x3.1 (in/yd) Discharge Instruction: Secure with Kerlix as directed. 98M Medipore H Soft Cloth Surgical T ape, 4 x 10 (in/yd) Discharge Instruction: Secure with tape as directed. Compression Wrap Juxtafit 30-32mmHg Discharge Instruction: apply in the morning and remove at night. Compression Stockings Add-Ons Electronic Signature([name]) Signed: 08/20/2022 4:50:23 PM By: Erenest Blank Signed: 08/20/2022 4:55:19 PM By: [name], [name] Entered By: Erenest Blank on 08/20/2022 12:56:39 -------------------------------------------------------------------------------- Wound Assessment Details Patient Name: Date of Service: [place], [name] [name] 08/20/2022 12:30 PM Medical Record Number: [id number] Patient Account Number: [id number] Date of Birth/Sex: Treating RN: 1945-11-22 (77 y.o. [name] [name] Primary Care Natalyia Innes: [name] Other Clinician: Referring Rodriques Badie: Treating Keven Soucy/Extender: [name] Weeks in Treatment: 21 Wound Status Wound Number: 7 Primary Lymphedema Etiology: Wound Location: Left, Medial Lower Leg Wound Open Wounding Event: Gradually Appeared Status: Date Acquired:  03/05/2022 Comorbid Anemia, Lymphedema, Deep Vein Thrombosis, Hypertension, Weeks Of Treatment: 21 History: Received Radiation Clustered Wound: Yes Howerter, Jalin ([id number].pdf Page 6 of 9 Photos Wound Measurements Length: (cm) Width: (cm) Depth: (cm) Clustered Quantity: Area: (cm) Volume: (cm) 3.8 % Reduction in Area: 84.2% 2.5 % Reduction in Volume: 68.3% 0.2 Epithelialization: Large (67-100%) 1 Tunneling: No 7.461 Undermining: No 1.492 Wound Description Classification: Full Thickness Without Exposed Sup Wound Margin: Distinct, outline attached Exudate Amount: Medium Exudate Type: Serosanguineous Exudate Color: red, brown port Structures Foul Odor After Cleansing: No Slough/Fibrino Yes Wound Bed Granulation Amount: Medium (34-66%) Exposed Structure Granulation Quality: Red, Pink Fascia Exposed: No Necrotic Amount: Medium (34-66%) Fat Layer (Subcutaneous Tissue) Exposed: Yes Necrotic Quality: Adherent Slough Tendon Exposed: No Muscle Exposed: No Joint Exposed: No Bone Exposed: No Periwound Skin Texture Texture Color No Abnormalities Noted: No No Abnormalities Noted: No Callus: No Atrophie Blanche: No Crepitus: No Cyanosis: No Excoriation: No Ecchymosis: No Induration: No Erythema: No Rash: No Hemosiderin Staining: Yes Scarring: Yes Mottled: No Pallor: No Moisture Rubor: No No Abnormalities Noted: No Dry / Scaly: No Maceration: Yes Treatment Notes Wound #7 (Lower Leg) Wound Laterality: Left, Medial Cleanser Soap and Water Discharge Instruction: May shower and wash wound with dial antibacterial soap and water prior to dressing change. Peri-Wound Care Sween Lotion (Moisturizing lotion) Discharge Instruction: Apply moisturizing lotion as directed Topical Primary Dressing Maxorb Extra Ag+ Alginate Dressing, 4x4.75 (in/in) Discharge Instruction: Apply to wound bed as instructed Compounding topical antibiotics-  [facility] Discharge Instruction: apply under alignate Ag when patient brings with appts. Secondary Dressing Varnadore, [name] [name] ([id number]) [phone].pdf Page 7 of 9 ABD Pad, 5x9 Discharge Instruction: Apply over primary dressing  as directed. Woven Gauze Sponge, Non-Sterile 4x4 in Discharge Instruction: Apply over primary dressing as directed. Secured With [employer], 4.5x3.1 (in/yd) Discharge Instruction: Secure with Kerlix as directed. 266M Medipore H Soft Cloth Surgical T ape, 4 x 10 (in/yd) Discharge Instruction: Secure with tape as directed. Compression Wrap Juxtafit 30-53mmHg Discharge Instruction: apply in the morning and remove at night. Compression Stockings Add-Ons Electronic Signature([name]) Signed: 08/20/2022 4:50:23 PM By: Erenest Blank Signed: 08/20/2022 4:55:19 PM By: [name], [name] Entered By: Erenest Blank on 08/20/2022 12:57:08 -------------------------------------------------------------------------------- Wound Assessment Details Patient Name: Date of Service: [place], [name] [name] 08/20/2022 12:30 PM Medical Record Number: [id number] Patient Account Number: [id number] Date of Birth/Sex: Treating RN: [birth year] (77 y.o. [name] [name] Primary Care Syrena Burges: [name] Other Clinician: Referring Jacayla Nordell: Treating Troi Florendo/Extender: [name] Weeks in Treatment: 21 Wound Status Wound Number: 9 Primary Lymphedema Etiology: Wound Location: Left, Lateral Lower Leg Wound Open Wounding Event: Gradually Appeared Status: Date Acquired: 08/20/2022 Notes: separate the left circ LE wound into two. Weeks Of Treatment: 0 Comorbid Anemia, Lymphedema, Deep Vein Thrombosis, Hypertension, Clustered Wound: No History: Received Radiation Photos Wound Measurements Length: (cm) 0.8 Width: (cm) 1.5 Depth: (cm) 0.2 Area: (cm) 0.942 Volume: (cm) 0.188 % Reduction in Area: % Reduction in  Volume: Epithelialization: Small (1-33%) Tunneling: No Undermining: No Wound Description Classification: Full Thickness Without Exposed Support Wound Margin: Distinct, outline attached Exudate Amount: Medium Exudate Type: Serosanguineous Exudate Color: red, brown Structures Foul Odor After Cleansing: No Slough/Fibrino Yes Wound Bed Bolon, [name] [name] ([id number].pdf Page 8 of 9 Granulation Amount: Medium (34-66%) Exposed Structure Granulation Quality: Red, Pink Fascia Exposed: No Necrotic Amount: Medium (34-66%) Fat Layer (Subcutaneous Tissue) Exposed: Yes Necrotic Quality: Adherent Slough Tendon Exposed: No Muscle Exposed: No Joint Exposed: No Bone Exposed: No Periwound Skin Texture Texture Color No Abnormalities Noted: No No Abnormalities Noted: No Callus: No Atrophie Blanche: No Crepitus: No Cyanosis: No Excoriation: No Ecchymosis: No Induration: No Erythema: No Rash: No Hemosiderin Staining: No Scarring: No Mottled: No Pallor: No Moisture Rubor: No No Abnormalities Noted: No Dry / Scaly: No Maceration: Yes Treatment Notes Wound #9 (Lower Leg) Wound Laterality: Left, Lateral Cleanser Soap and Water Discharge Instruction: May shower and wash wound with dial antibacterial soap and water prior to dressing change. Peri-Wound Care Sween Lotion (Moisturizing lotion) Discharge Instruction: Apply moisturizing lotion as directed Topical Primary Dressing Maxorb Extra Ag+ Alginate Dressing, 4x4.75 (in/in) Discharge Instruction: Apply to wound bed as instructed Compounding topical antibiotics- [facility] Discharge Instruction: apply under alignate Ag when patient brings with appts. Secondary Dressing ABD Pad, 5x9 Discharge Instruction: Apply over primary dressing as directed. Woven Gauze Sponge, Non-Sterile 4x4 in Discharge Instruction: Apply over primary dressing as directed. Secured With [employer], 4.5x3.1  (in/yd) Discharge Instruction: Secure with Kerlix as directed. 266M Medipore H Soft Cloth Surgical T ape, 4 x 10 (in/yd) Discharge Instruction: Secure with tape as directed. Compression Wrap Juxtafit 30-72mmHg Discharge Instruction: apply in the morning and remove at night. Compression Stockings Add-Ons Electronic Signature([name]) Signed: 08/20/2022 4:50:23 PM By: Erenest Blank Signed: 08/20/2022 4:55:19 PM By: [name], [name] Entered By: Erenest Blank on 08/20/2022 12:57:34 -------------------------------------------------------------------------------- Vitals Details Patient Name: Date of Service: [place], [name] [name] 08/20/2022 12:30 PM [place]on, [name] [name] ([id number].pdf Page 9 of 9 Medical Record Number: [id number] Patient Account Number: [id number] Date of Birth/Sex: Treating RN: June 23, 1945 (77 y.o. [name] [name] Primary Care Derrion Tritz: [name] Other Clinician: Referring Ameira Alessandrini: Treating Natayah Warmack/Extender: Joaquim Lai  [name], [name] [name], [name] [name] Weeks in Treatment: 21 Vital Signs Time Taken: 12:50 Temperature (F): 98.2 Height (in): 74 Pulse (bpm): 94 Weight (lbs): 250 Respiratory Rate (breaths/min): 20 Body Mass Index (BMI): 32.1 Blood Pressure (mmHg): 182/68 Reference Range: 80 - 120 mg / dl Electronic Signature([name]) Signed: 08/20/2022 4:55:19 PM By: [name], [name] Entered By: Deon Pilling on 08/20/2022 12:52:03

## 2022-08-20 NOTE — Progress Notes (Addendum)
Lafauci, [name] [id number] Page 1 of 11 Visit Report for 08/20/2022 Chief Complaint Document Details Patient Name: Date of Service: Cu, [name] 08/20/2022 12:30 PM Medical Record Number: [id number] Patient Account Number: [id number] Date of Birth/Sex: Treating RN: 1946/04/07 (77 y.o. M) Primary Care Provider: [name] Other Clinician: Referring Provider: Treating Provider/Extender: [name] Weeks in Treatment: 21 Information Obtained from: Patient Chief Complaint 02/23/2020; patient is here for wounds on his bilateral lower legs in the setting of severe lymphedema 03/26/2022; patient is here for wounds on his bilateral lower legs medial aspect Electronic Signature(s) Signed: 08/20/2022 12:58:37 PM By: [name] PA-C Entered By: [name] on 08/20/2022 12:58:36 -------------------------------------------------------------------------------- Debridement Details Patient Name: Date of Service: Dozier, [name] 08/20/2022 12:30 PM Medical Record Number: [id number] Patient Account Number: [id number] Date of Birth/Sex: Treating RN: [birth year] (77 y.o. [name], [name] Primary Care Provider: [name] Other Clinician: Referring Provider: Treating Provider/Extender: [name] Weeks in Treatment: 21 Debridement Performed for Assessment: Wound #6 Right,Medial Lower Leg Performed By: Physician [name], PA Debridement Type: Debridement Level of Consciousness (Pre-procedure): Awake and Alert Pre-procedure Verification/Time Out Yes - 12:55 Taken: Start Time: 12:56 Pain Control: Lidocaine 4% T opical Solution T Area Debrided (L x W): otal 8 (cm) x 5 (cm) = 40 (cm) Tissue and other material debrided: Non-Viable, Slough, Biofilm, Slough Level: Non-Viable Tissue Debridement Description: Selective/Open Wound Instrument: Curette Bleeding: Minimum Hemostasis Achieved: Pressure End  Time: 13:03 Procedural Pain: 0 Post Procedural Pain: 0 Response to Treatment: Procedure was tolerated well Level of Consciousness (Post- Awake and Alert procedure): Post Debridement Measurements of Total Wound Length: (cm) 8.6 Width: (cm) 5.5 Depth: (cm) 0.2 Volume: (cm) 7.43 Character of Wound/Ulcer Post Debridement: Improved Genna, Press ([id number]) [id number] Page 2 of 11 Post Procedure Diagnosis Same as Pre-procedure Electronic Signature(s) Signed: 08/20/2022 4:14:50 PM By: [name] PA-C Signed: 08/20/2022 4:55:19 PM By: [name] RN, [name] Entered By: [name] on 08/20/2022 13:03:18 -------------------------------------------------------------------------------- HPI Details Patient Name: Date of Service: Victorian, [name] 08/20/2022 12:30 PM Medical Record Number: [id number] Patient Account Number: [id number] Date of Birth/Sex: Treating RN: [birth year] (77 y.o. M) Primary Care Provider: [name] Other Clinician: Referring Provider: Treating Provider/Extender: [name] Weeks in Treatment: 21 History of Present Illness HPI Description: ADMISSION 02/23/2020 Patient is a 77 year old man who lives in [place] who arrives accompanied by his wife. He has a history of chronic lymphedema and venous insufficiency in his bilateral lower legs which may have something to do that with having a history of DVT as well as being treated for prostate cancer. In any case he recently got compression pumps at home but compliance has been an issue here. He has compression stockings however they are probably not sufficient enough to control swelling. They tell us that things deteriorated for him in late August he was admitted to [facility] for 7 days. This was with cellulitis I think of his bilateral lower legs. Discharge he was noted to have wounds on his bilateral lower legs. He was discharged on Bactrim. They tried to get him home  health through [facility] of course they declined him. His wife is been wrapping these applying some form of silver foam dressing. He has a history of wounds before although nothing that would not heal with basic home topical dressings. He has 2 areas on the left medial, left anterior  and left lateral and a smaller area on the right medial. All of these have considerable depth. Past medical history includes iron deficiency anemia, lymphedema followed by the rehab center at [facility] with lymphedema wraps I believe, DVT on chronic anticoagulation, prostate cancer, chronic venous insufficiency, hypertension. As mentioned he has compression pumps but does not use them. ABIs in our clinic were noncompressible bilaterally 10/14; patient with severe bilateral lymphedema right greater than left. He came in with bilateral lower extremity wounds left greater than right. Even though the right side has more of the edema most of the wounds here almost closed on the right medial. He has 3 remaining wounds on the left We have been using silver alginate under 4-layer compression I have been trying to get him to be compliant with his external compression pumps 10/21; patient with 3 small wounds on the left leg and 1 on the right medial in the setting of severe lymphedema and chronic venous insufficiency. We have been using silver alginate under 4-layer compression he is using his external compression pumps twice a day 11/4; ARTERIAL STUDIES on the right show an ABI of 1.02 TBI of 0.858 with biphasic waveforms on the left 0.98 with a TBI of 0.55 and biphasic waveforms. Does not look like he has significant arterial disease. We are treating him for lymphedema he has compression pumps. He has punched-out areas on the left anterior left lateral and right medial lower extremities 11/11; after we obtained his arterial studies I put him in 4 layer compression. He is using his compression pumps  probably once a day although I have asked him to do twice. Primary dressing to the wound is silver collagen he has severe lymphedema likely secondary to chronic venous insufficiency. Wounds on the left lateral, left medial and left anterior and a small area on the right medial 12/2; the area on the right anterior lower leg has healed. We initially thought that the area medially had healed as well however when her discharge nurse came in she detected fluid in the wound simply opened up. This is actually worse than I remember this pain. The area on the left lateral potentially slightly smaller He is also complaining about pain in his left hand he says that this is actually been getting some better he has been using topical creams on this. She asked that I look at this 12/9 after last weeks issues we have 2 wounds one on the right medial lower leg and 1 on the left lateral. Both of these are in the same condition. I think because of thickened skin secondary to chronic lymphedema these wounds actually have depth of almost 0.8 cm. 12/16; the patient has 2 small but deep wounds one on the right medial and one on the left lateral. The right medial is actually the worst of these. He arrives in clinic today with absolutely terrible edema in the right leg apparently his 4-layer wrap fell down to just above his ankle he did not think about this he is apparently been continuing to use his compression pump twice a day. The left leg looks a lot better. 05/09/2020 upon evaluation today patient appears to be doing decently well in regard to his wounds. Everything is measuring smaller the right leg still has a little bit deeper wound in the left seems to be almost completely healed in my opinion I am very pleased in general with how things are progressing. He has a 4- layer compression wrap we have been  using endoform today we will probably have to use collagen just based on the fact that we do not have endoform it is  on order. 1/6; the patient's wound on the left lateral lower leg has healed. Still has 1 on the right medial. 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 Using compression pumps at home twice a day. 1/13; left lateral lower leg is still healed. He has a deep punched out rectangular shaped wound on the right medial calf. Looking down at this it appears that he is attempting to epithelialize around the edges of the wound and on the base as well. His edema is reasonably well controlled we have been using collagen with absolutely no effect 1/20; left lateral lower leg remains closed he has extremitease stockings. The area on the right medial calf I aggressively debrided last week measures larger but the surface looks better. We have been using Hydrofera Blue. We ran Oasis through his insurance but we have not seen the results of this 1/27; left lower leg wound with chronic venous insufficiency and secondary lymphedema. I did aggressive debridement on this last week the wound seems to have come in healthy looking surface using Hydrofera Blue. Zappone, [name] ([id number]) [id number] Page 3 of 11 He was denied for Oasis 2/3; small divot in the right medial lower leg. Under illumination the walls of this divot are epithelialized however the base has slough which I removed with a curette we have been using Hydrofera Blue 2/10 small divot on the right medial lower leg pinpoint illumination at the base of this cone-shaped wound. We have been using Hydrofera Blue but I will switch to calcium alginate this week 2/17; the small divot on the right medial lower leg is fully epithelialized. There is no visible open area under illumination. He has his own stocking for the right leg similar to the one he has been wearing on the left. 03/26/2022; READMISSION This is a now 77 year old man that we had in the clinic from 02/23/2020 through 07/05/2020. At that point he had  bilateral lower extremity wounds left greater than right in the setting of severe lymphedema. He had already obtained compression pumps ordered for him I think from the wound care clinic in [facility] so I do not really have record of what he has been using. He claims to be using them once a day but there is a problem with the sleeve on the left leg. About 2 weeks ago he was hospitalized from 03/11/2022 through 03/14/2022 with diastolic congestive heart failure. 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 He was diuresed. Developed some prerenal azotemia and he has not been taking any diuretics currently. He has not been putting stockings on his legs since he got out of hospital and still has his legs dependent for long periods. Past medical history history of prostate cancer treated with prostatectomy and radiation this was apparently about 8 years ago, history of DVT on chronic Coumadin, history of lymphedema was managed for a while at the clinic in [place]. History of inguinal hernia repair in September 22, hypertension, stage IIIb chronic renal failure ABIs today were noncompressible on the right 1.12 on the left 04-02-2022 upon evaluation today patient appears to be doing well currently in regard to his legs I do feel like both areas that are draining are actually much drier than they were in the picture last week although the left is drier than the right.  He is tolerating the 4-layer compression wraps at this point he did contact the pump company and they are actually working on getting him a new compression sleeve for one of his legs which have previously popped and was not functioning properly. 04-23-2022 upon evaluation today patient appears to be doing well currently in regard to his wounds on the legs. I am actually very pleased with where things stand and I do feel like that we are headed in the right direction. Fortunately there is  no sign of active infection locally or systemically at this time. 05-07-2022 upon evaluation today patient appears to be doing well currently in regard to his wounds in fact things are showing signs of improvement which is good news I do not see too much that actually appears to be open and I am very pleased in that regard. No fevers, chills, nausea, vomiting, or diarrhea. 05-21-2022 upon evaluation today patient appears to be doing somewhat poorly in regard to drainage of his lower extremities bilaterally. The right is greater than left as far as the weeping area. Nonetheless it seems to be getting worse not better. He actually has pitting edema which is at least 2+ to the thighs and I am concerned about the fact that he is may be fluid overloaded in general and that is the reason why we cannot get this under control. I know he is not using his pumps all the time because he actually told the nurse that he was either going to pump or he was going to use his fluid pills but not do both. For that reason I do think that he needs to be really doing both in order to get the fluid out as effectively as possible obviously with the 4-layer compression wraps were doing as much as we can from a compression standpoint but it is really not enough. He tells me that he elevates his leg is much as he can in between pumping and other activity throughout the day. 05-28-2022 upon evaluation today patient appears to be doing better in regard to his wounds although the measurements may be a little bit larger this is a very difficult wound to heal it is very indistinct in a lot of areas. Nonetheless there is can be some need for sharp debridement in regard to both medial and lateral legs. Fortunately I see no signs of active infection locally nor systemically at this time. No fevers, chills, nausea, vomiting, or diarrhea. 06-04-2022 upon evaluation today patient appears to be doing poorly in general in regard to the wounds on  his legs. He still continues to have a tremendous amount of fluid not just in the lower portion of his leg but to be honest his thigh where he has 2-3+ pitting edema in the thigh as well. Unfortunately I do not know that we will be able to get this healed effectively and keep it healed on the lower extremities unless he gets the overall fluid situation taking and under control. Fortunately I do not see any signs of infection locally nor systemically which is great news. He just seems to be very fluid overloaded. 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 He has been tolerating the dressing changes with the compression wraps without complication. Fortunately I do not see any evidence of infection at this time which is great news. No fevers, chills, nausea, vomiting, or diarrhea. 06-18-2022 upon evaluation today patient appears to be  doing well currently in regard to his wounds as far as not looking like they are terribly infected but nonetheless I am concerned about a subacute infection secondary to the fact that he continues to have spreading despite the compression therapy. We actually did do an Unna boot on him last week this is actually the first wrap that actually stayed up everything else has been sliding down quite significantly. Fortunately there does not appear to be any signs of infection systemically at this time. With that being said I do believe that locally there seems to be an issue going on here and again I Ernie Hew do a PCR culture to see what that shows also think that I am going to put him on a broad-spectrum antibiotic, doxycycline to see how that will help as well. He does tell me that coming into the clinic today that he was feeling short of breath like "he was about to have a heart attack" because he was having such a hard time breathing. He says that he told this to Dr.  Jodelle Green his cardiologist as well when he was evaluated in the past 1 to 2 weeks. 07-02-2022 upon evaluation today patient appears to be doing poorly currently in regard to his wound. He has been tolerating the dressing changes. Unfortunately he has not had any compression wraps on for the past week because he was unable to make it in for his appointment last week. With that being said he has a significant amount of drainage he tells me has been using his pumps but despite this in the pumps he still has been draining quite a bit. The drainage is also somewhat purulent unfortunately. We did attempt to get in touch with his cardiologist last week unfortunately we were unable to get up with him I did advise that the patient needs to get in touch with him upon leaving today in order to make sure they know he is on the new antibiotics I am going to send him this will be Levaquin and Augmentin. 07-09-2022 upon evaluation today patient appears to be doing about the same in regard to his legs he may have just a slight amount of improvement with regard to the drainage probably [facility] topical antibiotics are helping in this regard to some degree. Fortunately there does not appear to be any signs of active infection systemically which is great news. No fevers, chills, nausea, vomiting, or diarrhea. 07-16-2022 upon evaluation today patient appears to be doing well currently in regard to his wound. He has been tolerating the dressing changes without complication. Fortunately there does not appear to be any signs of active infection locally nor systemically at this time. With that being said he cannot keep the wraps up he tells me on the left side he had to cut this down because it got too tight. He has been using his pumps but he is on the right side the wrap actually straight down causing some pushing around the central part of his leg just below the calf I think this is a bigger risk for him that help at this point.  I think that we may need to try something different he should be getting his compression socks shortly he tells me they were ordered last Thursday. 3/6; ; this is a patient who lives in [place]. He has severe bilateral lymphedema. He has compression pumps, we have been using kerlix Ace wrap [facility]. He is changing the dressing. We do not have home health. 08-06-2022 upon  evaluation today patient appears to be doing a little better in regard to his wounds in general at this point. Fortunately there does not appear to be any signs of active infection locally nor systemically at this time which is great news and overall I am extremely pleased with where we stand today. 08-13-2022 upon evaluation today patient appears to actually be doing significantly better compared to last week. He actually did go to the hospital I told him Milazzo, [name] ([id number]) [id number] Page 4 of 11 that he needed to when he left here and he actually did go. With that being said they actually ended up admitting him he was having shortness of breath and I thought it might be related to congestive heart failure turns out he actually had a pulmonary embolism. Subsequently they were able to get him off of the Coumadin switching over to Eliquis to get things stabilized in that regard they also had them wrapped and got his swelling under control on his legs he actually looks much better pretty much across the board at this point. I am very pleased in that regard. With that being said I am very happy that he finally went that could have been a very dangerous situation. 08-20-2022 upon evaluation today patient appears to be doing well currently in regard to his wound. Has been tolerating the dressing changes without complication. Fortunately there does not appear to be any signs of active infection locally nor systemically at this time. I think his legs are doing better there is some need for sharp  debridement today. Electronic Signature(s) Signed: 08/20/2022 1:14:26 PM By: [name] PA-C Entered By: [name] on 08/20/2022 13:14:25 -------------------------------------------------------------------------------- Physical Exam Details Patient Name: Date of Service: Can, [name] 08/20/2022 12:30 PM Medical Record Number: [id number] Patient Account Number: [id number] Date of Birth/Sex: Treating RN: 1946/04/03 (77 y.o. M) Primary Care Provider: [name] Other Clinician: Referring Provider: Treating Provider/Extender: [name] Weeks in Treatment: 21 Constitutional Chronically ill appearing but in no apparent acute distress. Respiratory normal breathing without difficulty. Psychiatric this patient is able to make decisions and demonstrates good insight into disease process. Alert and Oriented x 3. pleasant and cooperative. Notes Upon inspection patient's wound bed showed signs of good granulation epithelization at this point. Fortunately I do not see any evidence of active infection locally nor systemically which is great news and overall I do believe that we are headed in the right direction. He is very pleased with how things look I think is doing well with the juxta lite compression wraps as well. Electronic Signature(s) Signed: 08/20/2022 1:14:58 PM By: [name] PA-C Entered By: [name] on 08/20/2022 13:14:58 -------------------------------------------------------------------------------- Physician Orders Details Patient Name: Date of Service: Bran, [name] 08/20/2022 12:30 PM Medical Record Number: [id number] Patient Account Number: [id number] Date of Birth/Sex: Treating RN: [birth year] (77 y.o. [name] Primary Care Provider: [name] Other Clinician: Referring Provider: Treating Provider/Extender: [name] in Treatment: 27 Verbal / Phone Orders: No Diagnosis Coding ICD-10  Coding Code Description [id number] Chronic venous hypertension (idiopathic) with ulcer and inflammation of bilateral lower extremity I89.0 Lymphedema, not elsewhere classified L97.828 Non-pressure chronic ulcer of other part of left lower leg with other specified severity Petrella, Shneur ([id number]) [id number] Page 5 of 11 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 other part of right lower leg with other specified severity Follow-up Appointments ppointment in 1 week. Leonard Schwartz Wednesday 08/27/2022 1245  room 8 Return A ppointment in 2 weeks. Leonard Schwartz Wednesday 09/03/2022 130pm room 8 Return A Other: - ***Change daily. ***** Anesthetic (In clinic) Topical Lidocaine 5% applied to wound bed Bathing/ Shower/ Hygiene May shower with protection but do not get wound dressing(s) wet. Protect dressing(s) with water repellant cover (for example, large plastic bag) or a cast cover and may then take shower. Edema Control - Lymphedema / SCD / Other Lymphedema Pumps. Use Lymphedema pumps on leg(s) 2-3 times a day for 45-60 minutes. If wearing any wraps or hose, do not remove them. Continue exercising as instructed. - *****pump 3 times a day for an hour each time.***** Elevate legs to the level of the heart or above for 30 minutes daily and/or when sitting for 3-4 times a day throughout the day. - important Avoid standing for long periods of time. Exercise regularly Compression stocking or Garment 30-40 mm/Hg pressure to: - juxtafit Essentials Long for bilateral lower legs; quantity three juxtafit essentials per limb. Wound Treatment Wound #6 - Lower Leg Wound Laterality: Right, Medial Cleanser: Soap and Water 1 x Per Day/30 Days Discharge Instructions: May shower and wash wound with dial antibacterial soap and water prior to dressing change. Peri-Wound Care: Sween Lotion (Moisturizing lotion) 1 x Per Day/30 Days Discharge Instructions: Apply moisturizing lotion as directed Prim  Dressing: Maxorb Extra Ag+ Alginate Dressing, 4x4.75 (in/in) 1 x Per Day/30 Days ary Discharge Instructions: Apply to wound bed as instructed Prim Dressing: Compounding topical antibiotics- [facility] 1 x Per Day/30 Days ary Discharge Instructions: apply under alignate Ag when patient brings with appts. Secondary Dressing: ABD Pad, 5x9 1 x Per Day/30 Days Discharge Instructions: Apply over primary dressing as directed. Secondary Dressing: Woven Gauze Sponge, Non-Sterile 4x4 in 1 x Per Day/30 Days Discharge Instructions: Apply over primary dressing as directed. Secured With: [employer], 4.5x3.1 (in/yd) 1 x Per Day/30 Days Discharge Instructions: Secure with Kerlix as directed. Secured With: 56M Medipore H Soft Cloth Surgical T ape, 4 x 10 (in/yd) 1 x Per Day/30 Days Discharge Instructions: Secure with tape as directed. Compression Wrap: Juxtafit 30-92mmHg 1 x Per Day/30 Days Discharge Instructions: apply in the morning and remove at night. Wound #7 - Lower Leg Wound Laterality: Left, Medial Cleanser: Soap and Water 1 x Per Day/30 Days Discharge Instructions: May shower and wash wound with dial antibacterial soap and water prior to dressing change. Peri-Wound Care: Sween Lotion (Moisturizing lotion) 1 x Per Day/30 Days Discharge Instructions: Apply moisturizing lotion as directed Prim Dressing: Maxorb Extra Ag+ Alginate Dressing, 4x4.75 (in/in) 1 x Per Day/30 Days ary Discharge Instructions: Apply to wound bed as instructed Prim Dressing: Compounding topical antibiotics- [facility] 1 x Per Day/30 Days ary Discharge Instructions: apply under alignate Ag when patient brings with appts. Secondary Dressing: ABD Pad, 5x9 1 x Per Day/30 Days Discharge Instructions: Apply over primary dressing as directed. Secondary Dressing: Woven Gauze Sponge, Non-Sterile 4x4 in 1 x Per Day/30 Days Discharge Instructions: Apply over primary dressing as directed. Secured With: [employer], 4.5x3.1 (in/yd) 1 x Per Day/30 Days Discharge Instructions: Secure with Kerlix as directed. Secured With: 56M Medipore H Soft Cloth Surgical T ape, 4 x 10 (in/yd) 1 x Per Day/30 Days Discharge Instructions: Secure with tape as directed. Compression Wrap: Juxtafit 30-44mmHg 1 x Per Day/30 Days Warburton, [name] [id number] Page 6 of 11 Discharge Instructions: apply in the morning and remove at night. Wound #9 - Lower Leg Wound Laterality: Left, Lateral Cleanser: Soap  and Water 1 x Per Day/30 Days Discharge Instructions: May shower and wash wound with dial antibacterial soap and water prior to dressing change. Peri-Wound Care: Sween Lotion (Moisturizing lotion) 1 x Per Day/30 Days Discharge Instructions: Apply moisturizing lotion as directed Prim Dressing: Maxorb Extra Ag+ Alginate Dressing, 4x4.75 (in/in) 1 x Per Day/30 Days ary Discharge Instructions: Apply to wound bed as instructed Prim Dressing: Compounding topical antibiotics- [facility] 1 x Per Day/30 Days ary Discharge Instructions: apply under alignate Ag when patient brings with appts. Secondary Dressing: ABD Pad, 5x9 1 x Per Day/30 Days Discharge Instructions: Apply over primary dressing as directed. Secondary Dressing: Woven Gauze Sponge, Non-Sterile 4x4 in 1 x Per Day/30 Days Discharge Instructions: Apply over primary dressing as directed. Secured With: [employer], 4.5x3.1 (in/yd) 1 x Per Day/30 Days Discharge Instructions: Secure with Kerlix as directed. Secured With: 32M Medipore H Soft Cloth Surgical T ape, 4 x 10 (in/yd) 1 x Per Day/30 Days Discharge Instructions: Secure with tape as directed. Compression Wrap: Juxtafit 30-89mmHg 1 x Per Day/30 Days Discharge Instructions: apply in the morning and remove at night. Electronic Signature(s) Signed: 08/20/2022 4:14:50 PM By: [name] PA-C Signed: 08/20/2022 4:55:19 PM By: [name] RN, [name] Entered By:  [name] on 08/20/2022 13:22:19 -------------------------------------------------------------------------------- Problem List Details Patient Name: Date of Service: Rampey, [name] 08/20/2022 12:30 PM Medical Record Number: [id number] Patient Account Number: [id number] Date of Birth/Sex: Treating RN: 03-Sep-1945 (77 y.o. [name] Primary Care Provider: [name] Other Clinician: Referring Provider: Treating Provider/Extender: [name] Weeks in Treatment: 21 Active Problems ICD-10 Encounter Code Description Active Date MDM Diagnosis I87.333 Chronic venous hypertension (idiopathic) with ulcer and inflammation of 06/11/2022 No Yes bilateral lower extremity I89.0 Lymphedema, not elsewhere classified 03/26/2022 No Yes L97.828 Non-pressure chronic ulcer of other part of left lower leg with other specified 03/26/2022 No Yes severity 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 other part of right lower leg with other specified 03/26/2022 No Yes severity Birenbaum, Jamarea ([id number]) [id number] Page 7 of 11 Inactive Problems Resolved Problems Electronic Signature(s) Signed: 08/20/2022 12:58:25 PM By: [name] PA-C Entered By: [name] on 08/20/2022 12:58:25 -------------------------------------------------------------------------------- Progress Note Details Patient Name: Date of Service: Parisien, [name] 08/20/2022 12:30 PM Medical Record Number: [id number] Patient Account Number: [id number] Date of Birth/Sex: Treating RN: 1946-01-18 (77 y.o. M) Primary Care Provider: [name] Other Clinician: Referring Provider: Treating Provider/Extender: [name] Weeks in Treatment: 21 Subjective Chief Complaint Information obtained from Patient 02/23/2020; patient is here for wounds on his bilateral lower legs in the setting of severe lymphedema 03/26/2022; patient is here for wounds on his bilateral  lower legs medial aspect History of Present Illness (HPI) ADMISSION 02/23/2020 Patient is a 77 year old man who lives in [place] who arrives accompanied by his wife. He has a history of chronic lymphedema and venous insufficiency in his bilateral lower legs which may have something to do that with having a history of DVT as well as being treated for prostate cancer. In any case he recently got compression pumps at home but compliance has been an issue here. He has compression stockings however they are probably not sufficient enough to control swelling. They tell us that things deteriorated for him in late August he was admitted to [facility] for 7 days. This was with cellulitis I think of his bilateral lower legs. Discharge he was noted to have wounds on his bilateral lower legs.  He was discharged on Bactrim. They tried to get him home health through [facility] of course they declined him. His wife is been wrapping these applying some form of silver foam dressing. He has a history of wounds before although nothing that would not heal with basic home topical dressings. He has 2 areas on the left medial, left anterior and left lateral and a smaller area on the right medial. All of these have considerable depth. Past medical history includes iron deficiency anemia, lymphedema followed by the rehab center at [facility] with lymphedema wraps I believe, DVT on chronic anticoagulation, prostate cancer, chronic venous insufficiency, hypertension. As mentioned he has compression pumps but does not use them. ABIs in our clinic were noncompressible bilaterally 10/14; patient with severe bilateral lymphedema right greater than left. He came in with bilateral lower extremity wounds left greater than right. Even though the right side has more of the edema most of the wounds here almost closed on the right medial. He has 3 remaining wounds on the left We have been using silver alginate  under 4-layer compression I have been trying to get him to be compliant with his external compression pumps 10/21; patient with 3 small wounds on the left leg and 1 on the right medial in the setting of severe lymphedema and chronic venous insufficiency. We have been using silver alginate under 4-layer compression he is using his external compression pumps twice a day 11/4; ARTERIAL STUDIES on the right show an ABI of 1.02 TBI of 0.858 with biphasic waveforms on the left 0.98 with a TBI of 0.55 and biphasic waveforms. Does not look like he has significant arterial disease. We are treating him for lymphedema he has compression pumps. He has punched-out areas on the left anterior left lateral and right medial lower extremities 11/11; after we obtained his arterial studies I put him in 4 layer compression. He is using his compression pumps probably once a day although I have asked him to do twice. Primary dressing to the wound is silver collagen he has severe lymphedema likely secondary to chronic venous insufficiency. Wounds on the left lateral, left medial and left anterior and a small area on the right medial 12/2; the area on the right anterior lower leg has healed. We initially thought that the area medially had healed as well however when her discharge nurse came in she detected fluid in the wound simply opened up. This is actually worse than I remember this pain. The area on the left lateral potentially slightly smaller He is also complaining about pain in his left hand he says that this is actually been getting some better he has been using topical creams on this. She asked that I look at this 12/9 after last weeks issues we have 2 wounds one on the right medial lower leg and 1 on the left lateral. Both of these are in the same condition. I think because of thickened skin secondary to chronic lymphedema these wounds actually have depth of almost 0.8 cm. 12/16; the patient has 2 small but deep  wounds one on the right medial and one on the left lateral. The right medial is actually the worst of these. He arrives in clinic today with absolutely terrible edema in the right leg apparently his 4-layer wrap fell down to just above his ankle he did not think about this he is apparently been continuing to use his compression pump twice a day. The left leg  looks a lot better. 05/09/2020 upon evaluation today patient appears to be doing decently well in regard to his wounds. Everything is measuring smaller the right leg still has a little bit deeper wound in the left seems to be almost completely healed in my opinion I am very pleased in general with how things are progressing. He has a 4- layer compression wrap we have been using endoform today we will probably have to use collagen just based on the fact that we do not have endoform it is on order. Fannin, [name] [id number] Page 8 of 11 1/6; the patient's wound on the left lateral lower leg has healed. Still has 1 on the right medial. 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 Using compression pumps at home twice a day. 1/13; left lateral lower leg is still healed. He has a deep punched out rectangular shaped wound on the right medial calf. Looking down at this it appears that he is attempting to epithelialize around the edges of the wound and on the base as well. His edema is reasonably well controlled we have been using collagen with absolutely no effect 1/20; left lateral lower leg remains closed he has extremitease stockings. The area on the right medial calf I aggressively debrided last week measures larger but the surface looks better. We have been using Hydrofera Blue. We ran Oasis through his insurance but we have not seen the results of this 1/27; left lower leg wound with chronic venous insufficiency and secondary lymphedema. I did aggressive debridement on this last week the  wound seems to have come in healthy looking surface using Hydrofera Blue. He was denied for Oasis 2/3; small divot in the right medial lower leg. Under illumination the walls of this divot are epithelialized however the base has slough which I removed with a curette we have been using Hydrofera Blue 2/10 small divot on the right medial lower leg pinpoint illumination at the base of this cone-shaped wound. We have been using Hydrofera Blue but I will switch to calcium alginate this week 2/17; the small divot on the right medial lower leg is fully epithelialized. There is no visible open area under illumination. He has his own stocking for the right leg similar to the one he has been wearing on the left. 03/26/2022; READMISSION This is a now 77 year old man that we had in the clinic from 02/23/2020 through 07/05/2020. At that point he had bilateral lower extremity wounds left greater than right in the setting of severe lymphedema. He had already obtained compression pumps ordered for him I think from the wound care clinic in [facility] so I do not really have record of what he has been using. He claims to be using them once a day but there is a problem with the sleeve on the left leg. About 2 weeks ago he was hospitalized from 03/11/2022 through 03/14/2022 with diastolic congestive heart failure. 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 He was diuresed. Developed some prerenal azotemia and he has not been taking any diuretics currently. He has not been putting stockings on his legs since he got out of hospital and still has his legs dependent for long periods. Past medical history history of prostate cancer treated with prostatectomy and radiation this was apparently about 8 years ago, history of DVT on chronic Coumadin, history of lymphedema was managed for a while at the clinic in [place]. History of inguinal hernia  repair in September 22,  hypertension, stage IIIb chronic renal failure ABIs today were noncompressible on the right 1.12 on the left 04-02-2022 upon evaluation today patient appears to be doing well currently in regard to his legs I do feel like both areas that are draining are actually much drier than they were in the picture last week although the left is drier than the right. He is tolerating the 4-layer compression wraps at this point he did contact the pump company and they are actually working on getting him a new compression sleeve for one of his legs which have previously popped and was not functioning properly. 04-23-2022 upon evaluation today patient appears to be doing well currently in regard to his wounds on the legs. I am actually very pleased with where things stand and I do feel like that we are headed in the right direction. Fortunately there is no sign of active infection locally or systemically at this time. 05-07-2022 upon evaluation today patient appears to be doing well currently in regard to his wounds in fact things are showing signs of improvement which is good news I do not see too much that actually appears to be open and I am very pleased in that regard. No fevers, chills, nausea, vomiting, or diarrhea. 05-21-2022 upon evaluation today patient appears to be doing somewhat poorly in regard to drainage of his lower extremities bilaterally. The right is greater than left as far as the weeping area. Nonetheless it seems to be getting worse not better. He actually has pitting edema which is at least 2+ to the thighs and I am concerned about the fact that he is may be fluid overloaded in general and that is the reason why we cannot get this under control. I know he is not using his pumps all the time because he actually told the nurse that he was either going to pump or he was going to use his fluid pills but not do both. For that reason I do think that he needs to be really doing both in order to get the  fluid out as effectively as possible obviously with the 4-layer compression wraps were doing as much as we can from a compression standpoint but it is really not enough. He tells me that he elevates his leg is much as he can in between pumping and other activity throughout the day. 05-28-2022 upon evaluation today patient appears to be doing better in regard to his wounds although the measurements may be a little bit larger this is a very difficult wound to heal it is very indistinct in a lot of areas. Nonetheless there is can be some need for sharp debridement in regard to both medial and lateral legs. Fortunately I see no signs of active infection locally nor systemically at this time. No fevers, chills, nausea, vomiting, or diarrhea. 06-04-2022 upon evaluation today patient appears to be doing poorly in general in regard to the wounds on his legs. He still continues to have a tremendous amount of fluid not just in the lower portion of his leg but to be honest his thigh where he has 2-3+ pitting edema in the thigh as well. Unfortunately I do not know that we will be able to get this healed effectively and keep it healed on the lower extremities unless he gets the overall fluid situation taking and under control. Fortunately I do not see any signs of infection locally nor systemically which is great news. He just seems to be  very fluid overloaded. 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 He has been tolerating the dressing changes with the compression wraps without complication. Fortunately I do not see any evidence of infection at this time which is great news. No fevers, chills, nausea, vomiting, or diarrhea. 06-18-2022 upon evaluation today patient appears to be doing well currently in regard to his wounds as far as not looking like they are terribly infected but nonetheless I am  concerned about a subacute infection secondary to the fact that he continues to have spreading despite the compression therapy. We actually did do an Unna boot on him last week this is actually the first wrap that actually stayed up everything else has been sliding down quite significantly. Fortunately there does not appear to be any signs of infection systemically at this time. With that being said I do believe that locally there seems to be an issue going on here and again I Ernie Hew do a PCR culture to see what that shows also think that I am going to put him on a broad-spectrum antibiotic, doxycycline to see how that will help as well. He does tell me that coming into the clinic today that he was feeling short of breath like "he was about to have a heart attack" because he was having such a hard time breathing. He says that he told this to Dr. Jodelle Green his cardiologist as well when he was evaluated in the past 1 to 2 weeks. 07-02-2022 upon evaluation today patient appears to be doing poorly currently in regard to his wound. He has been tolerating the dressing changes. Unfortunately he has not had any compression wraps on for the past week because he was unable to make it in for his appointment last week. With that being said he has a significant amount of drainage he tells me has been using his pumps but despite this in the pumps he still has been draining quite a bit. The drainage is also somewhat purulent unfortunately. We did attempt to get in touch with his cardiologist last week unfortunately we were unable to get up with him I did advise that the patient needs to get in touch with him upon leaving today in order to make sure they know he is on the new antibiotics I am going to send him this will be Levaquin and Augmentin. 07-09-2022 upon evaluation today patient appears to be doing about the same in regard to his legs he may have just a slight amount of improvement with regard to the drainage  probably [facility] topical antibiotics are helping in this regard to some degree. Fortunately there does not appear to be any signs of active infection systemically which is great news. No fevers, chills, nausea, vomiting, or diarrhea. 07-16-2022 upon evaluation today patient appears to be doing well currently in regard to his wound. He has been tolerating the dressing changes without complication. Fortunately there does not appear to be any signs of active infection locally nor systemically at this time. With that being said he cannot keep the wraps up he tells me on the left side he had to cut this down because it got too tight. He has been using his pumps but he is on the right side the wrap actually straight down causing some pushing around the central part of his leg just below the calf I think this is a bigger risk for him that help at this point.  I Krul, [name] [id number] Page 9 of 11 think that we may need to try something different he should be getting his compression socks shortly he tells me they were ordered last Thursday. 3/6; ; this is a patient who lives in [place]. He has severe bilateral lymphedema. He has compression pumps, we have been using kerlix Ace wrap [facility]. He is changing the dressing. We do not have home health. 08-06-2022 upon evaluation today patient appears to be doing a little better in regard to his wounds in general at this point. Fortunately there does not appear to be any signs of active infection locally nor systemically at this time which is great news and overall I am extremely pleased with where we stand today. 08-13-2022 upon evaluation today patient appears to actually be doing significantly better compared to last week. He actually did go to the hospital I told him that he needed to when he left here and he actually did go. With that being said they actually ended up admitting him he was having shortness of breath and  I thought it might be related to congestive heart failure turns out he actually had a pulmonary embolism. Subsequently they were able to get him off of the Coumadin switching over to Eliquis to get things stabilized in that regard they also had them wrapped and got his swelling under control on his legs he actually looks much better pretty much across the board at this point. I am very pleased in that regard. With that being said I am very happy that he finally went that could have been a very dangerous situation. 08-20-2022 upon evaluation today patient appears to be doing well currently in regard to his wound. Has been tolerating the dressing changes without complication. Fortunately there does not appear to be any signs of active infection locally nor systemically at this time. I think his legs are doing better there is some need for sharp debridement today. Objective Constitutional Chronically ill appearing but in no apparent acute distress. Vitals Time Taken: 12:50 PM, Height: 74 in, Weight: 250 lbs, BMI: 32.1, Temperature: 98.2 F, Pulse: 94 bpm, Respiratory Rate: 20 breaths/min, Blood Pressure: 182/68 mmHg. Respiratory normal breathing without difficulty. Psychiatric this patient is able to make decisions and demonstrates good insight into disease process. Alert and Oriented x 3. pleasant and cooperative. General Notes: Upon inspection patient's wound bed showed signs of good granulation epithelization at this point. Fortunately I do not see any evidence of active infection locally nor systemically which is great news and overall I do believe that we are headed in the right direction. He is very pleased with how things look I think is doing well with the juxta lite compression wraps as well. Integumentary (Hair, Skin) Wound #6 status is Open. Original cause of wound was Gradually Appeared. The date acquired was: 03/05/2022. The wound has been in treatment 21 weeks. The wound is located on  the Right,Medial Lower Leg. The wound measures 8.6cm length x 5.5cm width x 0.1cm depth; 37.149cm^2 area and 3.715cm^3 volume. There is Fat Layer (Subcutaneous Tissue) exposed. There is no tunneling or undermining noted. There is a medium amount of serosanguineous drainage noted. The wound margin is distinct with the outline attached to the wound base. There is no granulation within the wound bed. There is a large (67-100%) amount of necrotic tissue within the wound bed including Adherent Slough. The periwound skin appearance exhibited: Scarring, Hemosiderin Staining. The periwound skin appearance did not exhibit: Callus, Crepitus, Excoriation,  Induration, Rash, Dry/Scaly, Maceration, Atrophie Blanche, Cyanosis, Ecchymosis, Mottled, Pallor, Rubor, Erythema. Wound #7 status is Open. Original cause of wound was Gradually Appeared. The date acquired was: 03/05/2022. The wound has been in treatment 21 weeks. The wound is located on the Left,Medial Lower Leg. The wound measures 3.8cm length x 2.5cm width x 0.2cm depth; 7.461cm^2 area and 1.492cm^3 volume. There is Fat Layer (Subcutaneous Tissue) exposed. There is no tunneling or undermining noted. There is a medium amount of serosanguineous drainage noted. The wound margin is distinct with the outline attached to the wound base. There is medium (34-66%) red, pink granulation within the wound bed. There is a medium (34-66%) amount of necrotic tissue within the wound bed including Adherent Slough. The periwound skin appearance exhibited: Scarring, Maceration, Hemosiderin Staining. The periwound skin appearance did not exhibit: Callus, Crepitus, Excoriation, Induration, Rash, Dry/Scaly, Atrophie Blanche, Cyanosis, Ecchymosis, Mottled, Pallor, Rubor, Erythema. Wound #9 status is Open. Original cause of wound was Gradually Appeared. The date acquired was: 08/20/2022. The wound is located on the Left,Lateral Lower Leg. The wound measures 0.8cm length x 1.5cm width  x 0.2cm depth; 0.942cm^2 area and 0.188cm^3 volume. There is Fat Layer (Subcutaneous Tissue) exposed. There is no tunneling or undermining noted. There is a medium amount of serosanguineous drainage noted. The wound margin is distinct with the outline attached to the wound base. There is medium (34-66%) red, pink granulation within the wound bed. There is a medium (34-66%) amount of necrotic tissue within the wound bed including Adherent Slough. The periwound skin appearance exhibited: Maceration. The periwound skin appearance did not exhibit: Callus, Crepitus, Excoriation, Induration, Rash, Scarring, Dry/Scaly, Atrophie Blanche, Cyanosis, Ecchymosis, Hemosiderin Staining, Mottled, Pallor, Rubor, Erythema. 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 There was a Selective/Open Wound Non-Viable Tissue Debridement with a total area of 40 sq cm performed by [name], PA. With the following instrument(s): Curette to remove Non-Viable tissue/material. Material removed includes Slough and Biofilm and after achieving pain control using Lidocaine 4% Topical Solution. A time out was conducted at 12:55, prior to the start of the procedure. A Minimum amount of bleeding was controlled with Pressure. The procedure was tolerated well with a pain level of 0 throughout and a pain level of 0 following the procedure. Post Debridement Measurements: 8.6cm length x 5.5cm width x 0.2cm depth; 7.43cm^3 volume. Character of  Wound/Ulcer Post Debridement is improved. Post procedure Diagnosis Wound #6: Same as Pre-Procedure Plan Follow-up Appointments: Return Appointment in 1 week. Leonard Schwartz Wednesday 08/27/2022 1245 room 8 Return Appointment in 2 weeks. Leonard Schwartz Wednesday 09/03/2022 130pm room 8 Other: - ***Change daily. ***** Anesthetic: (In clinic) Topical Lidocaine 5% applied to wound bed Bathing/ Shower/ Hygiene: May shower with protection but do not get wound dressing(s) wet. Protect dressing(s) with water repellant cover (for example, large plastic bag) or a cast cover and may then take shower. Edema Control - Lymphedema / SCD / Other: Lymphedema Pumps. Use Lymphedema pumps on leg(s) 2-3 times a day for 45-60 minutes. If wearing any wraps or hose, do not remove them. Continue exercising as instructed. - *****pump 3  times a day for an hour each time.***** Elevate legs to the level of the heart or above for 30 minutes daily and/or when sitting for 3-4 times a day throughout the day. - important Avoid standing for long periods of time. Exercise regularly Compression stocking or Garment 30-40 mm/Hg pressure to: - juxtafit Essentials Long for bilateral lower legs; quantity three juxtafit essentials per limb. WOUND #6: - Lower Leg Wound Laterality: Right, Medial Cleanser: Soap and Water 1 x Per Day/30 Days Discharge Instructions: May shower and wash wound with dial antibacterial soap and water prior to dressing change. Peri-Wound Care: Sween Lotion (Moisturizing lotion) 1 x Per Day/30 Days Discharge Instructions: Apply moisturizing lotion as directed Prim Dressing: Maxorb Extra Ag+ Alginate Dressing, 4x4.75 (in/in) 1 x Per Day/30 Days ary Discharge Instructions: Apply to wound bed as instructed Prim Dressing: Compounding topical antibiotics- [facility] 1 x Per Day/30 Days ary Discharge Instructions: apply under alignate Ag when patient brings with appts. Secondary Dressing: ABD Pad, 5x9 1 x Per Day/30  Days Discharge Instructions: Apply over primary dressing as directed. Secondary Dressing: Woven Gauze Sponge, Non-Sterile 4x4 in 1 x Per Day/30 Days Discharge Instructions: Apply over primary dressing as directed. Secured With: [employer], 4.5x3.1 (in/yd) 1 x Per Day/30 Days Discharge Instructions: Secure with Kerlix as directed. Secured With: 6M Medipore H Soft Cloth Surgical T ape, 4 x 10 (in/yd) 1 x Per Day/30 Days Discharge Instructions: Secure with tape as directed. Com pression Wrap: Juxtafit 30-62mmHg 1 x Per Day/30 Days Discharge Instructions: apply in the morning and remove at night. WOUND #7: - Lower Leg Wound Laterality: Left, Medial Cleanser: Soap and Water 1 x Per Day/30 Days Discharge Instructions: May shower and wash wound with dial antibacterial soap and water prior to dressing change. Peri-Wound Care: Sween Lotion (Moisturizing lotion) 1 x Per Day/30 Days Discharge Instructions: Apply moisturizing lotion as directed Prim Dressing: Maxorb Extra Ag+ Alginate Dressing, 4x4.75 (in/in) 1 x Per Day/30 Days ary Discharge Instructions: Apply to wound bed as instructed Prim Dressing: Compounding topical antibiotics- [facility] 1 x Per Day/30 Days ary Discharge Instructions: apply under alignate Ag when patient brings with appts. Secondary Dressing: ABD Pad, 5x9 1 x Per Day/30 Days Discharge Instructions: Apply over primary dressing as directed. Secondary Dressing: Woven Gauze Sponge, Non-Sterile 4x4 in 1 x Per Day/30 Days Discharge Instructions: Apply over primary dressing as directed. Secured With: [employer], 4.5x3.1 (in/yd) 1 x Per Day/30 Days Discharge Instructions: Secure with Kerlix as directed. Secured With: 6M Medipore H Soft Cloth Surgical T ape, 4 x 10 (in/yd) 1 x Per Day/30 Days Discharge Instructions: Secure with tape as directed. Com pression Wrap: Juxtafit 30-53mmHg 1 x Per Day/30 Days Discharge Instructions: apply in the morning and remove  at night. WOUND #9: - Lower Leg Wound Laterality: Left, Lateral Cleanser: Soap and Water 1 x Per Day/30 Days Discharge Instructions: May shower and wash wound with dial antibacterial soap and water prior to dressing change. Peri-Wound Care: Sween Lotion (Moisturizing lotion) 1 x Per Day/30 Days Discharge Instructions: Apply moisturizing lotion as directed Prim Dressing: Maxorb Extra Ag+ Alginate Dressing, 4x4.75 (in/in) 1 x Per Day/30 Days ary Discharge Instructions: Apply to wound bed as instructed Prim Dressing: Compounding topical antibiotics- [facility] 1 x Per Day/30 Days ary Discharge Instructions: apply under alignate Ag when patient brings with appts. Secondary Dressing: ABD Pad, 5x9 1 x Per Day/30 Days Discharge Instructions: Apply over primary dressing as directed. Secondary Dressing: Woven Gauze Sponge, Non-Sterile  4x4 in 1 x Per Day/30 Days Discharge Instructions: Apply over primary dressing as directed. Secured With: [employer], 4.5x3.1 (in/yd) 1 x Per Day/30 Days Discharge Instructions: Secure with Kerlix as directed. Secured With: 53M Medipore H Soft Cloth Surgical T ape, 4 x 10 (in/yd) 1 x Per Day/30 Days Discharge Instructions: Secure with tape as directed. Com pression Wrap: Juxtafit 30-98mmHg 1 x Per Day/30 Days [name], [name] ([id number]) [id number] Page 11 of 11 Discharge Instructions: apply in the morning and remove at night. 1. I am going to recommend that we have the patient continue to monitor for any signs of infection or worsening. I do believe that he is still making excellent progress and I am happy with the juxta fit compression wraps and how they are doing at this point. 2. I am also can recommend that the patient should continue to use the ABD pads to cover over top of the [facility] topical antibiotics and then again we are using silver alginate dressing as well. I am very pleased in this regard. We will see patient  back for reevaluation in 1 week here in the clinic. If anything worsens or changes patient will contact our office for additional recommendations. Electronic Signature(s) Signed: 08/22/2022 1:00:20 PM By: [name] RN, [name] Signed: 11/17/2022 3:13:10 PM By: [name] PA-C Previous Signature: 08/20/2022 1:15:31 PM Version By: [name] PA-C Entered By: [name] on 08/21/2022 16:26:19 -------------------------------------------------------------------------------- SuperBill Details Patient Name: Date of Service: Spindel, [name] 08/20/2022 Medical Record Number: [id number] Patient Account Number: [id number] Date of Birth/Sex: Treating RN: 03/07/1946 (77 y.o. [name], [name] Primary Care Provider: [name] Other Clinician: Referring Provider: Treating Provider/Extender: [name] Weeks in Treatment: 21 Diagnosis Coding ICD-10 Codes Code Description [phone] Chronic venous hypertension (idiopathic) with ulcer and inflammation of bilateral lower extremity I89.0 Lymphedema, not elsewhere classified 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 other part of right lower leg with other specified severity Facility Procedures : CPT4 Code: [id number] Description: 97597 - DEBRIDE WOUND 1ST 20 SQ CM OR < ICD-10 Diagnosis Description 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 other part of right lower leg with other specified se Modifier: verity Quantity: 1 : CPT4 Code: [id number] Description: 97598 - DEBRIDE WOUND EA ADDL 20 SQ CM ICD-10 Diagnosis Description 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 other part of right lower leg with other specified se Modifier: verity Quantity: 1 Physician Procedures : CPT4 Code Description Modifier [id number] 97597 - WC PHYS DEBR WO ANESTH 20 SQ CM ICD-10 Diagnosis Description 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 other part of right lower leg with  other specified severity Quantity: 1 : [id number] 97598 - WC PHYS DEBR WO ANESTH EA ADD 20 CM ICD-10 Diagnosis Description 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 other part of right lower leg with other specified severity Quantity: 1 Electronic Signature(s) Signed: 08/20/2022 1:16:28 PM By: [name] PA-C Entered By: [name] on 08/20/2022 13:16:28

## 2022-08-27 ENCOUNTER — Encounter (HOSPITAL_BASED_OUTPATIENT_CLINIC_OR_DEPARTMENT_OTHER): Payer: Medicare Other | Admitting: Physician Assistant

## 2022-08-27 DIAGNOSIS — I87333 Chronic venous hypertension (idiopathic) with ulcer and inflammation of bilateral lower extremity: Secondary | ICD-10-CM | POA: Diagnosis not present

## 2022-08-27 NOTE — Progress Notes (Signed)
Meissner, [name] ([id number]) [id number] Page 1 of 8 Visit Report for 08/27/2022 Arrival Information Details Patient Name: Date of Service: [name], [name] 08/27/2022 12:45 PM Medical Record Number: [id number] Patient Account Number: [id number] Date of Birth/Sex: Treating RN: 1945-09-19 (77 y.o. [name] Primary Care Kharis Lapenna: [name] Other Clinician: Referring Taeshawn Helfman: Treating Zebulen Simonis/Extender: [name] in Treatment: 22 Visit Information History Since Last Visit Added or deleted any medications: No Patient Arrived: Cane Any new allergies or adverse reactions: No Arrival Time: 12:43 Had a fall or experienced change in No Accompanied By: wife activities of daily living that may affect Transfer Assistance: None risk of falls: Patient Identification Verified: Yes Signs or symptoms of abuse/neglect since last visito No Secondary Verification Process Completed: Yes Hospitalized since last visit: No Patient Requires Transmission-Based Precautions: No Implantable device outside of the clinic excluding No Patient Has Alerts: Yes cellular tissue based products placed in the center Patient Alerts: Patient on Blood Thinner since last visit: Right ABI in clinic [place] Has Dressing in Place as Prescribed: Yes Has Compression in Place as Prescribed: Yes Pain Present Now: No Electronic Signature(s) Signed: 08/27/2022 4:16:30 PM By: [name] RN, [name] Entered By: [name] on 08/27/2022 12:45:29 -------------------------------------------------------------------------------- Encounter Discharge Information Details Patient Name: Date of Service: [name], [name] 08/27/2022 12:45 PM Medical Record Number: [id number] Patient Account Number: [id number] Date of Birth/Sex: Treating RN: 03-Oct-1945 (76 y.o. [name] Primary Care Shey Bartmess: [name] Other Clinician: Referring Jameisha Stofko: Treating  Johannah Rozas/Extender: [name] Weeks in Treatment: 22 Encounter Discharge Information Items Post Procedure Vitals Discharge Condition: Stable Temperature (F): 97.9 Ambulatory Status: Cane Pulse (bpm): 79 Discharge Destination: Home Respiratory Rate (breaths/min): 20 Transportation: Private Auto Blood Pressure (mmHg): 179/75 Accompanied By: self Schedule Follow-up Appointment: Yes Clinical Summary of Care: Electronic Signature(s) Signed: 08/27/2022 4:16:30 PM By: [name] RN, [name] Entered By: [name] on 08/27/2022 13:41:08 -------------------------------------------------------------------------------- Lower Extremity Assessment Details Patient Name: Date of Service: Hopes, [name] 08/27/2022 12:45 PM Medical Record Number: [id number] Patient Account Number: [id number] Date of Birth/Sex: Treating RN: 1946/03/26 (77 y.o. [name] Primary Care Jennea Rager: [name] Other Clinician: Referring Vegas Fritze: Treating Dollie Mayse/Extender: Leveda Anna, [name] Weeks in Treatment: 22 Edema Assessment J[Left: [name], [name] ([id number])] Franne Forts: [id number] Page 2 of 8] Assessed: [Left: Yes] [Right: Yes] Edema: [Left: Yes] [Right: Yes] Calf Left: Right: Point of Measurement: 40 cm From Medial Instep 50 cm 58 cm Ankle Left: Right: Point of Measurement: 13 cm From Medial Instep 40 cm 40 cm Vascular Assessment Pulses: Dorsalis Pedis Palpable: [Left:Yes] [Right:Yes] Electronic Signature(s) Signed: 08/27/2022 4:16:30 PM By: [name] RN, [name] Entered By: [name] on 08/27/2022 12:54:25 -------------------------------------------------------------------------------- Multi-Disciplinary Care Plan Details Patient Name: Date of Service: [name], [name] 08/27/2022 12:45 PM Medical Record Number: [id number] Patient Account Number: [id number] Date of Birth/Sex: Treating RN: 1945-12-31 (77 y.o. [name] Primary Care Blake Vetrano: [name] Other Clinician: Referring Georgeann Brinkman: Treating Shuntae Herzig/Extender: [name] Weeks in Treatment: 22 Active Inactive Wound/Skin Impairment Nursing Diagnoses: Impaired tissue integrity Knowledge deficit related to ulceration/compromised skin integrity Goals: Patient will have a decrease in wound volume by X% from date: (specify in notes) Date Initiated: 03/26/2022 Target Resolution Date: 09/19/2022 Goal Status: Active Patient/caregiver will verbalize understanding of skin care regimen Date Initiated: 03/26/2022 Target Resolution Date: 09/20/2022 Goal Status: Active Ulcer/skin breakdown will have a volume reduction of 30% by week 4 Date  Initiated: 03/26/2022 Date Inactivated: 05/21/2022 Target Resolution Date: 05/17/2022 Unmet Reason: see wound Goal Status: Unmet measurement. Ulcer/skin breakdown will have a volume reduction of 50% by week 8 Date Initiated: 03/26/2022 Date Inactivated: 05/21/2022 Target Resolution Date: 05/17/2022 Unmet Reason: see wound Goal Status: Unmet measurement. Interventions: Assess patient/caregiver ability to obtain necessary supplies Assess patient/caregiver ability to perform ulcer/skin care regimen upon admission and as needed Assess ulceration(s) every visit Notes: Patient stated today, "I will take my fluid pill or pump not do both." Oneika Simonian made aware. Electronic Signature(s) Signed: 08/27/2022 4:16:30 PM By: [name] RN, [name] Entered By: [name] on 08/27/2022 12:57:35 Wyble, [name] ([id number]) [id number] Page 3 of 8 -------------------------------------------------------------------------------- Pain Assessment Details Patient Name: Date of Service: [name], [name] 08/27/2022 12:45 PM Medical Record Number: [id number] Patient Account Number: [id number] Date of Birth/Sex: Treating RN: 1945-06-16 (77 y.o. [name] Primary Care Carrell Rahmani:  [name] Other Clinician: Referring Blayze Haen: Treating Tkai Large/Extender: [name] Weeks in Treatment: 22 Active Problems Location of Pain Severity and Description of Pain Patient Has Paino No Site Locations Pain Management and Medication Current Pain Management: Electronic Signature(s) Signed: 08/27/2022 4:16:30 PM By: [name] RN, [name] Entered By: [name] on 08/27/2022 12:45:38 -------------------------------------------------------------------------------- Patient/Caregiver Education Details Patient Name: Date of Service: Sigmon, [name] 4/10/2024andnbsp12:45 PM Medical Record Number: [id number] Patient Account Number: [id number] Date of Birth/Gender: Treating RN: 01-10-1946 (77 y.o. [name] Primary Care Physician: [name] Other Clinician: Referring Physician: Treating Physician/Extender: [name] in Treatment: 22 Education Assessment Education Provided To: Patient Education Topics Provided Wound/Skin Impairment: Handouts: Caring for Your Ulcer Methods: Explain/Verbal Responses: Reinforcements needed Electronic Signature(s) Signed: 08/27/2022 4:16:30 PM By: [name] RN, [name] Entered By: [name] on 08/27/2022 12:57:49 Walrond, [name] ([id number]) [id number] Page 4 of 8 -------------------------------------------------------------------------------- Wound Assessment Details Patient Name: Date of Service: Kingsberry, [name] 08/27/2022 12:45 PM Medical Record Number: [id number] Patient Account Number: [id number] Date of Birth/Sex: Treating RN: 12/01/1945 (77 y.o. [name], [name] Primary Care Victorious Kundinger: [name] Other Clinician: Referring Elianis Fischbach: Treating Miana Politte/Extender: [name] Weeks in Treatment: 22 Wound Status Wound Number: 6 Primary Lymphedema Etiology: Wound Location: Right, Medial Lower Leg Wound  Open Wounding Event: Gradually Appeared Status: Date Acquired: 03/05/2022 Comorbid Anemia, Lymphedema, Deep Vein Thrombosis, Hypertension, Weeks Of Treatment: 22 History: Received Radiation Clustered Wound: Yes Photos Wound Measurements Length: (cm) Width: (cm) Depth: (cm) Clustered Quantity: Area: (cm) Volume: (cm) 8.5 % Reduction in Area: 81.3% 4 % Reduction in Volume: 62.6% 0.2 Epithelialization: Medium (34-66%) 1 Tunneling: No 26.704 Undermining: No 5.341 Wound Description Classification: Full Thickness Without Exposed Sup Wound Margin: Distinct, outline attached Exudate Amount: Medium Exudate Type: Serosanguineous Exudate Color: red, brown port Structures Foul Odor After Cleansing: No Slough/Fibrino Yes Wound Bed Granulation Amount: None Present (0%) Exposed Structure Necrotic Amount: Large (67-100%) Fascia Exposed: No Necrotic Quality: Adherent Slough Fat Layer (Subcutaneous Tissue) Exposed: Yes Tendon Exposed: No Muscle Exposed: No Joint Exposed: No Bone Exposed: No Periwound Skin Texture Texture Color No Abnormalities Noted: No No Abnormalities Noted: No Callus: No Atrophie Blanche: No Crepitus: No Cyanosis: No Excoriation: No Ecchymosis: No Induration: No Erythema: No Rash: No Hemosiderin Staining: Yes Scarring: Yes Mottled: No Pallor: No Moisture Rubor: No No Abnormalities Noted: No Dry / Scaly: No Maceration: No Treatment Notes Wound #6 (Lower Leg) Wound Laterality: Right, Medial Atteberry, Gaylan ([id number]) [id number] Page 5 of 8 Cleanser Soap and Water Discharge Instruction:  May shower and wash wound with dial antibacterial soap and water prior to dressing change. Peri-Wound Care Sween Lotion (Moisturizing lotion) Discharge Instruction: Apply moisturizing lotion as directed Topical Primary Dressing Maxorb Extra Ag+ Alginate Dressing, 4x4.75 (in/in) Discharge Instruction: Apply to wound bed as  instructed Compounding topical antibiotics- [facility] Discharge Instruction: apply under alignate Ag when patient brings with appts. Secondary Dressing ABD Pad, 5x9 Discharge Instruction: Apply over primary dressing as directed. Woven Gauze Sponge, Non-Sterile 4x4 in Discharge Instruction: Apply over primary dressing as directed. Secured With [employer], 4.5x3.1 (in/yd) Discharge Instruction: Secure with Kerlix as directed. 10M Medipore H Soft Cloth Surgical T ape, 4 x 10 (in/yd) Discharge Instruction: Secure with tape as directed. Compression Wrap Juxtafit 30-17mmHg Discharge Instruction: apply in the morning and remove at night. Compression Stockings Add-Ons Electronic Signature(s) Signed: 08/27/2022 4:16:30 PM By: [name] RN, [name] Entered By: [name] on 08/27/2022 12:56:48 -------------------------------------------------------------------------------- Wound Assessment Details Patient Name: Date of Service: Moe, [name] 08/27/2022 12:45 PM Medical Record Number: [id number] Patient Account Number: [id number] Date of Birth/Sex: Treating RN: [birth year] (77 y.o. [name] Primary Care Awais Cobarrubias: [name] Other Clinician: Referring Roberto Romanoski: Treating Zalman Hull/Extender: [name] Weeks in Treatment: 22 Wound Status Wound Number: 7 Primary Lymphedema Etiology: Wound Location: Left, Medial Lower Leg Wound Open Wounding Event: Gradually Appeared Status: Date Acquired: 03/05/2022 Comorbid Anemia, Lymphedema, Deep Vein Thrombosis, Hypertension, Weeks Of Treatment: 22 History: Received Radiation Clustered Wound: Yes Photos Camero, [name] ([id number]) [id number] Page 6 of 8 Wound Measurements Length: (cm) Width: (cm) Depth: (cm) Clustered Quantity: Area: (cm) Volume: (cm) 2.5 % Reduction in Area: 85.8% 3.4 % Reduction in Volume: 71.7% 0.2 Epithelialization: Large (67-100%) 1  Tunneling: No 6.676 Undermining: No 1.335 Wound Description Classification: Full Thickness Without Exposed Sup Wound Margin: Distinct, outline attached Exudate Amount: Medium Exudate Type: Serosanguineous Exudate Color: red, brown port Structures Foul Odor After Cleansing: No Slough/Fibrino Yes Wound Bed Granulation Amount: Large (67-100%) Exposed Structure Granulation Quality: Red, Pink Fascia Exposed: No Necrotic Amount: Small (1-33%) Fat Layer (Subcutaneous Tissue) Exposed: Yes Necrotic Quality: Adherent Slough Tendon Exposed: No Muscle Exposed: No Joint Exposed: No Bone Exposed: No Periwound Skin Texture Texture Color No Abnormalities Noted: No No Abnormalities Noted: No Callus: No Atrophie Blanche: No Crepitus: No Cyanosis: No Excoriation: No Ecchymosis: No Induration: No Erythema: No Rash: No Hemosiderin Staining: Yes Scarring: Yes Mottled: No Pallor: No Moisture Rubor: No No Abnormalities Noted: No Dry / Scaly: No Maceration: Yes Treatment Notes Wound #7 (Lower Leg) Wound Laterality: Left, Medial Cleanser Soap and Water Discharge Instruction: May shower and wash wound with dial antibacterial soap and water prior to dressing change. Peri-Wound Care Sween Lotion (Moisturizing lotion) Discharge Instruction: Apply moisturizing lotion as directed Topical Primary Dressing Maxorb Extra Ag+ Alginate Dressing, 4x4.75 (in/in) Discharge Instruction: Apply to wound bed as instructed Compounding topical antibiotics- [facility] Discharge Instruction: apply under alignate Ag when patient brings with appts. Secondary Dressing ABD Pad, 5x9 Discharge Instruction: Apply over primary dressing as directed. Woven Gauze Sponge, Non-Sterile 4x4 in Discharge Instruction: Apply over primary dressing as directed. Secured With [employer], 4.5x3.1 (in/yd) Discharge Instruction: Secure with Kerlix as directed. 10M Medipore H Soft Cloth Surgical T ape, 4 x 10  (in/yd) Discharge Instruction: Secure with tape as directed. Compression Wrap Juxtafit 30-42mmHg Shuck, [name] ([id number]) [phone] Page 7 of 8 Discharge Instruction: apply in the morning and remove at night. Compression Stockings Add-Ons Electronic Signature(s) Signed: 08/27/2022 4:16:30 PM By:  Shawn Stalleaton, Bobbi RN, [name] Entered By: Shawn Stalleaton, Bobbi on 08/27/2022 12:57:28 -------------------------------------------------------------------------------- Wound Assessment Details Patient Name: Date of Service: Nickolas, [name] GroomsDA [name] 08/27/2022 12:45 PM Medical Record Number: [id number] Patient Account Number: [id number] Date of Birth/Sex: Treating RN: [birth year] ([age] y.o. [name] SoursM) Deaton, Bobbi Primary Care Leniyah Martell: Ricki RodriguezKadakia, Ajay S Other Clinician: Referring Joandy Burget: Treating Dayrin Stallone/Extender: Sydell AxonStone III, Hoyt Kadakia, Ajay S Weeks in Treatment: 22 Wound Status Wound Number: 9 Primary Lymphedema Etiology: Wound Location: Left, Lateral Lower Leg Wound Open Wounding Event: Gradually Appeared Status: Date Acquired: 08/20/2022 Notes: separate the left circ LE wound into two. Weeks Of Treatment: 1 Comorbid Anemia, Lymphedema, Deep Vein Thrombosis, Hypertension, Clustered Wound: No History: Received Radiation Photos Wound Measurements Length: (cm) Width: (cm) Depth: (cm) Area: (cm) Volume: (cm) 0 % Reduction in Area: 100% 0 % Reduction in Volume: 100% 0 Epithelialization: Large (67-100%) 0 Tunneling: No 0 Undermining: No Wound Description Classification: Full Thickness Without Exposed Support Structures Wound Margin: Distinct, outline attached Exudate Amount: None Present Foul Odor After Cleansing: No Slough/Fibrino No Wound Bed Granulation Amount: None Present (0%) Exposed Structure Necrotic Amount: Medium (34-66%) Fascia Exposed: No Necrotic Quality: Adherent Slough Fat Layer (Subcutaneous Tissue) Exposed: No Tendon Exposed: No Muscle Exposed: No Joint  Exposed: No Bone Exposed: No Periwound Skin Texture Texture Color No Abnormalities Noted: No No Abnormalities Noted: No Callus: No Atrophie Blanche: No Crepitus: No Cyanosis: No Excoriation: No Ecchymosis: No Induration: No Erythema: No Rash: No Hemosiderin Staining: No [place], [place] ([id number]) [id number] Page 8 of 8 Scarring: No Mottled: No Pallor: No Moisture Rubor: No No Abnormalities Noted: No Dry / Scaly: No Maceration: Yes Electronic Signature(s) Signed: 08/27/2022 4:16:30 PM By: Shawn Stalleaton, Bobbi RN, [name] Entered By: Shawn Stalleaton, Bobbi on 08/27/2022 12:58:16 -------------------------------------------------------------------------------- Vitals Details Patient Name: Date of Service: Weng, [name] 08/27/2022 12:45 PM Medical Record Number: [id number] Patient Account Number: [id number] Date of Birth/Sex: Treating RN: [birth year](76 y.o. [name] SoursM) Deaton, Bobbi Primary Care Raylyn Speckman: Ricki RodriguezKadakia, Ajay S Other Clinician: Referring Shamarion [name]: Treating Jolon Degante/Extender: Sydell AxonStone III, Hoyt Kadakia, Ajay S Weeks in Treatment: 22 Vital Signs Time Taken: 12:45 Temperature (F): 97.9 Height (in): 74 Pulse (bpm): 79 Weight (lbs): 250 Respiratory Rate (breaths/min): 20 Body Mass Index (BMI): 32.1 Blood Pressure (mmHg): 179/75 Reference Range: 80 - 120 mg / dl Electronic Signature(s) Signed: 08/27/2022 4:16:30 PM By: Shawn Stalleaton, Bobbi RN, [name] Entered By: Shawn Stalleaton, Bobbi on 08/27/2022 12:56:09

## 2022-08-27 NOTE — Progress Notes (Signed)
[name] ([id number]) [id number] Page 1 of 5 Visit Report for 08/27/2022 Chief Complaint Document Details Patient Name: Date of Service: [name] 08/27/2022 12:45 PM Medical Record Number: [id number] Patient Account Number: [id number] Date of Birth/Sex: Treating RN: 11-09-1945 (77 y.o. M) Primary Care Provider: [name] Other Clinician: Referring Provider: Treating Provider/Extender: [name] Weeks in Treatment: 22 Information Obtained from: Patient Chief Complaint 02/23/2020; patient is here for wounds on his bilateral lower legs in the setting of severe lymphedema 03/26/2022; patient is here for wounds on his bilateral lower legs medial aspect Electronic Signature(s) Signed: 08/27/2022 1:08:42 PM By: [name] PA-C Entered By: [name] on 08/27/2022 13:08:41 -------------------------------------------------------------------------------- Debridement Details Patient Name: Date of Service: [name] 08/27/2022 12:45 PM Medical Record Number: [id number] Patient Account Number: [id number] Date of Birth/Sex: Treating RN: Jun 16, 1945 (77 y.o. [name] Primary Care Provider: [name] Other Clinician: Referring Provider: Treating Provider/Extender: [name] Weeks in Treatment: 22 Debridement Performed for Assessment: Wound #7 Left,Medial Lower Leg Performed By: Physician [name], PA Debridement Type: Debridement Level of Consciousness (Pre-procedure): Awake and Alert Pre-procedure Verification/Time Out Yes - 13:18 Taken: Start Time: 13:19 Pain Control: Lidocaine 5% topical ointment T Area Debrided (L x W): otal 2.5 (cm) x 3.4 (cm) = 8.5 (cm) Tissue and other material debrided: Non-Viable, Slough, Biofilm, Slough Level: Non-Viable Tissue Debridement Description: Selective/Open Wound Instrument: Curette Bleeding: Minimum Hemostasis Achieved: Pressure End  Time: 13:35 Procedural Pain: 0 Post Procedural Pain: 0 Response to Treatment: Procedure was tolerated well Level of Consciousness (Post- Awake and Alert procedure): Post Debridement Measurements of Total Wound Length: (cm) 2.5 Width: (cm) 3.4 Depth: (cm) 0.2 Volume: (cm) 1.335 Character of Wound/Ulcer Post Debridement: Requires Further Debridement Post Procedure Diagnosis Same as Pre-procedure Electronic Signature(s) Unsigned Entered By: [name] on 08/27/2022 13:35:37 Signature(s): [name] ([id number]) [id number] Date(s): [id number] Page 2 of 5 -------------------------------------------------------------------------------- Debridement Details Patient Name: Date of Service: [name] 08/27/2022 12:45 PM Medical Record Number: [id number] Patient Account Number: [id number] Date of Birth/Sex: Treating RN: 1945-09-16 (77 y.o. [name] Primary Care Provider: [name] Other Clinician: Referring Provider: Treating Provider/Extender: [name] Weeks in Treatment: 22 Debridement Performed for Assessment: Wound #6 Right,Medial Lower Leg Performed By: Physician [name], PA Debridement Type: Debridement Level of Consciousness (Pre-procedure): Awake and Alert Pre-procedure Verification/Time Out Yes - 13:18 Taken: Start Time: 13:19 Pain Control: Lidocaine 5% topical ointment T Area Debrided (L x W): otal 8.5 (cm) x 4 (cm) = 34 (cm) Tissue and other material debrided: Non-Viable, Slough, Biofilm, Slough Level: Non-Viable Tissue Debridement Description: Selective/Open Wound Instrument: Curette Bleeding: Minimum Hemostasis Achieved: Pressure End Time: 13:35 Procedural Pain: 0 Post Procedural Pain: 0 Response to Treatment: Procedure was tolerated well Level of Consciousness (Post- Awake and Alert procedure): Post Debridement Measurements of Total Wound Length: (cm) 8.5 Width: (cm) 4 Depth: (cm)  0.2 Volume: (cm) 5.341 Character of Wound/Ulcer Post Debridement: Requires Further Debridement Post Procedure Diagnosis Same as Pre-procedure Electronic Signature(s) Unsigned Entered By: [name] on 08/27/2022 13:40:00 -------------------------------------------------------------------------------- Physician Orders Details Patient Name: Date of Service: [name] [name] 08/27/2022 12:45 PM Medical Record Number: [id number] Patient Account Number: [id number] Date of Birth/Sex: Treating RN: [birth year] (77 y.o. 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 Leonard Schwartz Wednesday 09/03/2022 130pm room 8 Return A [name] ([id number]) [phone].pdf Page 3 of 5 ppointment in 2 weeks. Leonard Schwartz Wednesday 09/10/2022 130pm room 8 Return A Other: - ***Change daily. ***** Anesthetic (In clinic) Topical Lidocaine 5% applied to wound bed Bathing/ Shower/ Hygiene May shower with protection but do not get wound dressing(s) wet. Protect dressing(s) with water repellant cover (for example, large plastic bag) or a cast cover and may then take shower. Edema Control - Lymphedema / SCD / Other Lymphedema Pumps. Use Lymphedema pumps on leg(s) 2-3 times a day for 45-60 minutes. If wearing any wraps or hose, do not remove them. Continue exercising as instructed. - *****pump 3 times a day for  an hour each time.***** Elevate legs to the level of the heart or above for 30 minutes daily and/or when sitting for 3-4 times a day throughout the day. - important Avoid standing for long periods of time. Exercise regularly Compression stocking or Garment 30-40 mm/Hg pressure to: - juxtafit Essentials Long for bilateral lower legs; quantity three juxtafit essentials per limb. Wound Treatment Wound #6 - Lower Leg Wound Laterality: Right, Medial Cleanser: Soap and Water 1 x Per Day/30 Days Discharge Instructions: May shower and wash wound with dial antibacterial soap and water prior to dressing change. Peri-Wound Care: Sween Lotion (Moisturizing lotion) 1 x Per Day/30 Days Discharge Instructions: Apply moisturizing lotion as directed Prim Dressing: Maxorb Extra Ag+ Alginate Dressing, 4x4.75 (in/in) 1 x Per Day/30 Days ary Discharge Instructions: Apply to wound bed as instructed Prim Dressing: Compounding topical antibiotics- [facility] 1 x Per Day/30 Days ary Discharge Instructions: apply under alignate Ag when patient brings with appts. Secondary Dressing: ABD Pad, 5x9 1 x Per Day/30 Days Discharge Instructions: Apply over primary dressing as directed. Secondary Dressing: Woven Gauze Sponge, Non-Sterile 4x4 in 1 x Per Day/30 Days Discharge Instructions: Apply over primary dressing as directed. Secured With: [employer], 4.5x3.1 (in/yd) 1 x Per Day/30 Days Discharge Instructions: Secure with Kerlix as directed. Secured With: 57M Medipore H Soft Cloth Surgical T ape, 4 x 10 (in/yd) 1 x Per Day/30 Days Discharge Instructions: Secure with tape as directed. Compression Wrap: Juxtafit 30-25mmHg 1 x Per Day/30 Days Discharge Instructions: apply in the morning and remove at night. Wound #7 - Lower Leg Wound Laterality: Left, Medial Cleanser: Soap and Water 1 x Per Day/30 Days Discharge Instructions: May shower and wash wound with dial antibacterial soap and water prior to dressing  change. Peri-Wound Care: Sween Lotion (Moisturizing lotion) 1 x Per Day/30 Days Discharge Instructions: Apply moisturizing lotion as directed Prim Dressing: Maxorb Extra Ag+ Alginate Dressing, 4x4.75 (in/in) 1 x Per Day/30 Days ary Discharge Instructions: Apply to wound bed as instructed Prim Dressing: Compounding topical antibiotics- [facility] 1 x Per Day/30 Days ary Discharge Instructions: apply under alignate Ag when patient brings with appts. Secondary Dressing: ABD Pad, 5x9 1 x Per Day/30 Days Discharge Instructions: Apply over primary dressing as directed. Secondary Dressing: Woven Gauze Sponge, Non-Sterile 4x4 in 1 x Per Day/30 Days Discharge Instructions: Apply over primary dressing as directed. Secured With: [employer], 4.5x3.1 (in/yd) 1 x Per Day/30 Days Discharge  Instructions: Secure with Kerlix as directed. Secured With: 82M Medipore H Soft Cloth Surgical T ape, 4 x 10 (in/yd) 1 x Per Day/30 Days Discharge Instructions: Secure with tape as directed. Compression Wrap: Juxtafit 30-3540mmHg 1 x Per Day/30 Days Discharge Instructions: apply in the morning and remove at night. Morss, Dannielle HuhANNY ([id number]) [id number] Page 4 of 5 Electronic Signature(s) Unsigned Entered By: Shawn Stalleaton, Bobbi on 08/27/2022 13:40:08 -------------------------------------------------------------------------------- Problem List Details Patient Name: Date of Service: [name], [name] GroomsDA [name] 08/27/2022 12:45 PM Medical Record Number: [id number] Patient Account Number: [id number] Date of Birth/Sex: Treating RN: 1946/04/02 (77 y.o. M) Primary Care Provider: Ricki RodriguezKadakia, Ajay S Other Clinician: Referring Provider: Treating Provider/Extender: Arva ChafeStone III, Octavious Zidek Kadakia, Ajay S Weeks in Treatment: 22 Active Problems ICD-10 Encounter Code Description Active Date MDM Diagnosis I87.333 Chronic venous hypertension (idiopathic) with ulcer and inflammation of 06/11/2022 No  Yes bilateral lower extremity I89.0 Lymphedema, not elsewhere classified 03/26/2022 No Yes L97.828 Non-pressure chronic ulcer of other part of left lower leg with other specified 03/26/2022 No Yes severity 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 other part of right lower leg with other specified 03/26/2022 No Yes severity Inactive Problems Resolved Problems Electronic Signature(s) Signed: 08/27/2022 12:51:28 PM By: Allen DerryStone, Roiza Wiedel PA-C Entered By: Allen DerryStone, Aragorn Recker on 08/27/2022 12:51:27 -------------------------------------------------------------------------------- SuperBill Details Patient Name: Date of Service: Degrazia, DA [name] 08/27/2022 Medical Record Number: [id number] Patient Account Number: [id number] Date of Birth/Sex: Treating RN: 1946/04/02 69(76 y.o. 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 Tiano, Christohper ([id number]) [id number] Page 5 of 5 Facility Procedures : CPT4 Code: [id number] Description: [phone] - DEBRIDE WOUND 1ST 20 SQ CM OR < ICD-10 Diagnosis Description L97.828 Non-pressure chronic ulcer of other part of left lower leg with other specified se 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 other part of right lower leg with other  specified s Modifier: verity everity Quantity: 1 : CPT4 Code: [id number] Description: [id number] - DEBRIDE WOUND EA ADDL 20 SQ CM ICD-10 Diagnosis Description 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 other part of right  lower leg with other specified s L97.828 Non-pressure chronic ulcer of other part of left lower leg with other  specified se Modifier: everity verity Quantity: 2 Physician Procedures : CPT4 Code Description Modifier [id number] - WC PHYS DEBR WO ANESTH 20 SQ CM ICD-10 Diagnosis Description 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 other  part of right lower leg with other specified severity Quantity: 1 : [id number] - WC PHYS DEBR WO ANESTH EA ADD 20 CM ICD-10 Diagnosis Description 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 other part of right lower leg with other specified severity L97.828 Non-pressure chronic ulcer of other part of left lower leg with  other specified severity Quantity: 2 Electronic Signature(s) Unsigned Entered By: Shawn Stalleaton, Bobbi on 08/27/2022 13:40:29 Signature(s): Date(s):

## 2022-09-03 ENCOUNTER — Encounter (HOSPITAL_BASED_OUTPATIENT_CLINIC_OR_DEPARTMENT_OTHER): Payer: Medicare Other | Admitting: Physician Assistant

## 2022-09-03 DIAGNOSIS — I87333 Chronic venous hypertension (idiopathic) with ulcer and inflammation of bilateral lower extremity: Secondary | ICD-10-CM | POA: Diagnosis not present

## 2022-09-03 NOTE — Progress Notes (Signed)
[name] [name] [name] ([id number]) [id number] Page 1 of 10 Visit Report for 09/03/2022 Chief Complaint Document Details Patient Name: Date of Service: [name] [name] [name] 09/03/2022 1:30 PM Medical Record Number: [id number] Patient Account Number: [id number] Date of Birth/Sex: Treating RN: 04-Jun-1945 (77 y.o. M) Primary Care Provider: [name] Other Clinician: Referring Provider: Treating Provider/Extender: [name] Weeks in Treatment: 23 Information Obtained from: Patient Chief Complaint 02/23/2020; patient is here for wounds on his bilateral lower legs in the setting of severe lymphedema 03/26/2022; patient is here for wounds on his bilateral lower legs medial aspect Electronic Signature(s) Signed: 09/03/2022 1:58:51 PM By: [name] PA-C Entered By: [name] on 09/03/2022 13:58:51 -------------------------------------------------------------------------------- Debridement Details Patient Name: Date of Service: [name] [name] 09/03/2022 1:30 PM Medical Record Number: [id number] Patient Account Number: [id number] Date of Birth/Sex: Treating RN: 1945/06/20 (77 y.o. [name] [name] Primary Care Provider: [name] Other Clinician: Referring Provider: Treating Provider/Extender: [name] Weeks in Treatment: 23 Debridement Performed for Assessment: Wound #6 Right,Medial Lower Leg Performed By: Physician [name], PA Debridement Type: Debridement Level of Consciousness (Pre-procedure): Awake and Alert Pre-procedure Verification/Time Out Yes - 14:10 Taken: Start Time: 14:11 Pain Control: Lidocaine 4% T opical Solution T Area Debrided (L x W): otal 5 (cm) x 3 (cm) = 15 (cm) Tissue and other material debrided: Viable, Non-Viable, Slough, Subcutaneous, Biofilm, Slough Level: Skin/Subcutaneous Tissue Debridement Description: Excisional Instrument: Curette Bleeding: Minimum Hemostasis Achieved:  Pressure End Time: 14:19 Procedural Pain: 0 Post Procedural Pain: 0 Response to Treatment: Procedure was tolerated well Level of Consciousness (Post- Awake and Alert procedure): Post Debridement Measurements of Total Wound Length: (cm) 8.4 Width: (cm) 3.9 Depth: (cm) 0.2 Volume: (cm) 5.146 Character of Wound/Ulcer Post Debridement: Improved Post Procedure Diagnosis Same as Pre-procedure Electronic Signature(s) Unsigned Entered By: [name] on 09/03/2022 14:20:37 Signature(s): [name] [name] ([id number]) 12 Date(s): [id number] Page 2 of 10 -------------------------------------------------------------------------------- HPI Details Patient Name: Date of Service: [name] [name] 09/03/2022 1:30 PM Medical Record Number: [id number] Patient Account Number: [id number] Date of Birth/Sex: Treating RN: [birth year] (77 y.o. M) Primary Care Provider: [name] Other Clinician: Referring Provider: Treating Provider/Extender: [name] Weeks in Treatment: 23 History of Present Illness HPI Description: ADMISSION 02/23/2020 Patient is a 77 year old man who lives in [place] who arrives accompanied by his wife. He has a history of chronic lymphedema and venous insufficiency in his bilateral lower legs which may have something to do that with having a history of DVT as well as being treated for prostate cancer. In any case he recently got compression pumps at home but compliance has been an issue here. He has compression stockings however they are probably not sufficient enough to control swelling. They tell us that things deteriorated for him in late August he was admitted to [facility] for 7 days. This was with cellulitis I think of his bilateral lower legs. Discharge he was noted to have wounds on his bilateral lower legs. He was discharged on Bactrim. They tried to get him home health through [facility] of  course they declined him. His wife is been wrapping these applying some form of silver foam dressing. He has a history of wounds before although nothing that would not heal with basic home topical dressings. He has 2 areas on the left medial, left anterior and left lateral and a smaller area on the right medial. All of these have  considerable depth. Past medical history includes iron deficiency anemia, lymphedema followed by the rehab center at [facility] with lymphedema wraps I believe, DVT on chronic anticoagulation, prostate cancer, chronic venous insufficiency, hypertension. As mentioned he has compression pumps but does not use them. ABIs in our clinic were noncompressible bilaterally 10/14; patient with severe bilateral lymphedema right greater than left. He came in with bilateral lower extremity wounds left greater than right. Even though the right side has more of the edema most of the wounds here almost closed on the right medial. He has 3 remaining wounds on the left We have been using silver alginate under 4-layer compression I have been trying to get him to be compliant with his external compression pumps 10/21; patient with 3 small wounds on the left leg and 1 on the right medial in the setting of severe lymphedema and chronic venous insufficiency. We have been using silver alginate under 4-layer compression he is using his external compression pumps twice a day 11/4; ARTERIAL STUDIES on the right show an ABI of 1.02 TBI of 0.858 with biphasic waveforms on the left 0.98 with a TBI of 0.55 and biphasic waveforms. Does not look like he has significant arterial disease. We are treating him for lymphedema he has compression pumps. He has punched-out areas on the left anterior left lateral and right medial lower extremities 11/11; after we obtained his arterial studies I put him in 4 layer compression. He is using his compression pumps probably once a day although I have asked him to do  twice. Primary dressing to the wound is silver collagen he has severe lymphedema likely secondary to chronic venous insufficiency. Wounds on the left lateral, left medial and left anterior and a small area on the right medial 12/2; the area on the right anterior lower leg has healed. We initially thought that the area medially had healed as well however when her discharge nurse came in she detected fluid in the wound simply opened up. This is actually worse than I remember this pain. The area on the left lateral potentially slightly smaller He is also complaining about pain in his left hand he says that this is actually been getting some better he has been using topical creams on this. She asked that I look at this 12/9 after last weeks issues we have 2 wounds one on the right medial lower leg and 1 on the left lateral. Both of these are in the same condition. I think because of thickened skin secondary to chronic lymphedema these wounds actually have depth of almost 0.8 cm. 12/16; the patient has 2 small but deep wounds one on the right medial and one on the left lateral. The right medial is actually the worst of these. He arrives in clinic today with absolutely terrible edema in the right leg apparently his 4-layer wrap fell down to just above his ankle he did not think about this he is apparently been continuing to use his compression pump twice a day. The left leg looks a lot better. 05/09/2020 upon evaluation today patient appears to be doing decently well in regard to his wounds. Everything is measuring smaller the right leg still has a little bit deeper wound in the left seems to be almost completely healed in my opinion I am very pleased in general with how things are progressing. He has a 4- layer compression wrap we have been using endoform today we will probably have to use collagen just based on the fact  that we do not have endoform it is on order. 1/6; the patient's wound on the left  lateral lower leg has healed. Still has 1 on the right medial. 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 Using compression pumps at home twice a day. 1/13; left lateral lower leg is still healed. He has a deep punched out rectangular shaped wound on the right medial calf. Looking down at this it appears that he is attempting to epithelialize around the edges of the wound and on the base as well. His edema is reasonably well controlled we have been using collagen with absolutely no effect 1/20; left lateral lower leg remains closed he has extremitease stockings. The area on the right medial calf I aggressively debrided last week measures larger but the surface looks better. We have been using Hydrofera Blue. We ran Oasis through his insurance but we have not seen the results of this 1/27; left lower leg wound with chronic venous insufficiency and secondary lymphedema. I did aggressive debridement on this last week the wound seems to have come in healthy looking surface using Hydrofera Blue. He was denied for Oasis 2/3; small divot in the right medial lower leg. Under illumination the walls of this divot are epithelialized however the base has slough which I removed with a curette we have been using Hydrofera Blue 2/10 small divot on the right medial lower leg pinpoint illumination at the base of this cone-shaped wound. We have been using Hydrofera Blue but I will switch to calcium alginate this week 2/17; the small divot on the right medial lower leg is fully epithelialized. There is no visible open area under illumination. He has his own stocking for the right leg similar to the one he has been wearing on the left. 03/26/2022; READMISSION This is a now 77 year old man that we had in the clinic from 02/23/2020 through 07/05/2020. At that point he had bilateral lower extremity wounds left greater than right in the setting of severe lymphedema. He had already obtained compression pumps  ordered for him I think from the wound care clinic in [facility] so I do not really have record of what he has been using. He claims to be using them once a day but there is a problem with the sleeve on the left leg. [name] [name] [name] ([id number]) [id number] Page 3 of 10 About 2 weeks ago he was hospitalized from 03/11/2022 through 03/14/2022 with diastolic congestive heart failure. 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 He was diuresed. Developed some prerenal azotemia and he has not been taking any diuretics currently. He has not been putting stockings on his legs since he got out of hospital and still has his legs dependent for long periods. Past medical history history of prostate cancer treated with prostatectomy and radiation this was apparently about 8 years ago, history of DVT on chronic Coumadin, history of lymphedema was managed for a while at the clinic in [place]. History of inguinal hernia repair in September 22, hypertension, stage IIIb chronic renal failure ABIs today were noncompressible on the right 1.12 on the left 04-02-2022 upon evaluation today patient appears to be doing well currently in regard to his legs I do feel like both areas that are draining are actually much drier than they were in the picture last week although the left is drier than the right. He is tolerating the 4-layer compression wraps at this point he did contact the pump  company and they are actually working on getting him a new compression sleeve for one of his legs which have previously popped and was not functioning properly. 04-23-2022 upon evaluation today patient appears to be doing well currently in regard to his wounds on the legs. I am actually very pleased with where things stand and I do feel like that we are headed in the right direction. Fortunately there is no sign of active infection locally or systemically at  this time. 05-07-2022 upon evaluation today patient appears to be doing well currently in regard to his wounds in fact things are showing signs of improvement which is good news I do not see too much that actually appears to be open and I am very pleased in that regard. No fevers, chills, nausea, vomiting, or diarrhea. 05-21-2022 upon evaluation today patient appears to be doing somewhat poorly in regard to drainage of his lower extremities bilaterally. The right is greater than left as far as the weeping area. Nonetheless it seems to be getting worse not better. He actually has pitting edema which is at least 2+ to the thighs and I am concerned about the fact that he is may be fluid overloaded in general and that is the reason why we cannot get this under control. I know he is not using his pumps all the time because he actually told the nurse that he was either going to pump or he was going to use his fluid pills but not do both. For that reason I do think that he needs to be really doing both in order to get the fluid out as effectively as possible obviously with the 4-layer compression wraps were doing as much as we can from a compression standpoint but it is really not enough. He tells me that he elevates his leg is much as he can in between pumping and other activity throughout the day. 05-28-2022 upon evaluation today patient appears to be doing better in regard to his wounds although the measurements may be a little bit larger this is a very difficult wound to heal it is very indistinct in a lot of areas. Nonetheless there is can be some need for sharp debridement in regard to both medial and lateral legs. Fortunately I see no signs of active infection locally nor systemically at this time. No fevers, chills, nausea, vomiting, or diarrhea. 06-04-2022 upon evaluation today patient appears to be doing poorly in general in regard to the wounds on his legs. He still continues to have a  tremendous amount of fluid not just in the lower portion of his leg but to be honest his thigh where he has 2-3+ pitting edema in the thigh as well. Unfortunately I do not know that we will be able to get this healed effectively and keep it healed on the lower extremities unless he gets the overall fluid situation taking and under control. Fortunately I do not see any signs of infection locally nor systemically which is great news. He just seems to be very fluid overloaded. 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 He has been tolerating the dressing changes with the compression wraps without complication. Fortunately I do not see any evidence of infection at this time which is great news. No fevers, chills, nausea, vomiting, or diarrhea. 06-18-2022 upon evaluation today patient appears to be doing well currently in regard to his wounds as far as not looking like they  are terribly infected but nonetheless I am concerned about a subacute infection secondary to the fact that he continues to have spreading despite the compression therapy. We actually did do an Unna boot on him last week this is actually the first wrap that actually stayed up everything else has been sliding down quite significantly. Fortunately there does not appear to be any signs of infection systemically at this time. With that being said I do believe that locally there seems to be an issue going on here and again I Ernie Hew do a PCR culture to see what that shows also think that I am going to put him on a broad-spectrum antibiotic, doxycycline to see how that will help as well. He does tell me that coming into the clinic today that he was feeling short of breath like "he was about to have a heart attack" because he was having such a hard time breathing. He says that he told this to Dr. Jodelle Green his cardiologist as well when he  was evaluated in the past 1 to 2 weeks. 07-02-2022 upon evaluation today patient appears to be doing poorly currently in regard to his wound. He has been tolerating the dressing changes. Unfortunately he has not had any compression wraps on for the past week because he was unable to make it in for his appointment last week. With that being said he has a significant amount of drainage he tells me has been using his pumps but despite this in the pumps he still has been draining quite a bit. The drainage is also somewhat purulent unfortunately. We did attempt to get in touch with his cardiologist last week unfortunately we were unable to get up with him I did advise that the patient needs to get in touch with him upon leaving today in order to make sure they know he is on the new antibiotics I am going to send him this will be Levaquin and Augmentin. 07-09-2022 upon evaluation today patient appears to be doing about the same in regard to his legs he may have just a slight amount of improvement with regard to the drainage probably [facility] topical antibiotics are helping in this regard to some degree. Fortunately there does not appear to be any signs of active infection systemically which is great news. No fevers, chills, nausea, vomiting, or diarrhea. 07-16-2022 upon evaluation today patient appears to be doing well currently in regard to his wound. He has been tolerating the dressing changes without complication. Fortunately there does not appear to be any signs of active infection locally nor systemically at this time. With that being said he cannot keep the wraps up he tells me on the left side he had to cut this down because it got too tight. He has been using his pumps but he is on the right side the wrap actually straight down causing some pushing around the central part of his leg just below the calf I think this is a bigger risk for him that help at this point. I think that we may need to try  something different he should be getting his compression socks shortly he tells me they were ordered last Thursday. 3/6; ; this is a patient who lives in [place]. He has severe bilateral lymphedema. He has compression pumps, we have been using kerlix Ace wrap [facility]stone. He is changing the dressing. We do not have home health. 08-06-2022 upon evaluation today patient appears to be doing a little better in regard to his wounds  in general at this point. Fortunately there does not appear to be any signs of active infection locally nor systemically at this time which is great news and overall I am extremely pleased with where we stand today. 08-13-2022 upon evaluation today patient appears to actually be doing significantly better compared to last week. He actually did go to the hospital I told him that he needed to when he left here and he actually did go. With that being said they actually ended up admitting him he was having shortness of breath and I thought it might be related to congestive heart failure turns out he actually had a pulmonary embolism. Subsequently they were able to get him off of the Coumadin switching over to Eliquis to get things stabilized in that regard they also had them wrapped and got his swelling under control on his legs he actually looks much better pretty much across the board at this point. I am very pleased in that regard. With that being said I am very happy that he finally went that could have been a very dangerous situation. 08-20-2022 upon evaluation today patient appears to be doing well currently in regard to his wound. Has been tolerating the dressing changes without complication. Fortunately there does not appear to be any signs of active infection locally nor systemically at this time. I think his legs are doing better there is some need for sharp debridement today. 08-27-2022 upon evaluation patient is actually making excellent progress. I am actually very pleased with  where he stands and I think that he is moving in the right direction. In general I think that we are looking pretty good at the moment. 09-03-2022 upon evaluation today patient appears to be doing well currently in regard to his wound. He is actually tolerating dressing changes on the left and right leg without complication. Fortunately I do not see any need for debridement of the left leg the right leg I think we probably do some need to perform some debridement here. Filla, [name] [name] ([id number]) [id number] Page 4 of 10 Electronic Signature(s) Signed: 09/03/2022 2:32:28 PM By: [name] PA-C Entered By: [name] on 09/03/2022 14:32:28 -------------------------------------------------------------------------------- Physical Exam Details Patient Name: Date of Service: Irigoyen, DA [name] 09/03/2022 1:30 PM Medical Record Number: [id number] Patient Account Number: [id number] Date of Birth/Sex: Treating RN: 1945-11-28 (77 y.o. M) Primary Care Provider: [name] Other Clinician: Referring Provider: Treating Provider/Extender: [name] Weeks in Treatment: 23 Constitutional Chronically ill appearing but in no apparent acute distress. Respiratory normal breathing without difficulty. Psychiatric this patient is able to make decisions and demonstrates good insight into disease process. Alert and Oriented x 3. pleasant and cooperative. Notes Upon inspection patient's wound bed actually showed signs of good granulation epithelization at this point. Fortunately I do not see any signs of active infection which is great news. No fevers, chills, nausea, vomiting, or diarrhea. Electronic Signature(s) Signed: 09/03/2022 2:32:52 PM By: [name] PA-C Entered By: [name] on 09/03/2022 14:32:52 -------------------------------------------------------------------------------- Physician Orders Details Patient Name: Date of Service: Hu,  DA [name] 09/03/2022 1:30 PM Medical Record Number: [id number] Patient Account Number: [id number] Date of Birth/Sex: Treating RN: 01-Jan-1946 (77 y.o. [name] Primary Care Provider: [name] Other Clinician: Referring Provider: Treating Provider/Extender: [name] Weeks in Treatment: 10 Verbal / Phone Orders: No Diagnosis Coding ICD-10 Coding Code Description [phone] Chronic venous hypertension (idiopathic) with ulcer and inflammation of bilateral lower extremity I89.0 Lymphedema,  not elsewhere classified 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 other part of right lower leg with other specified severity Follow-up Appointments ppointment in 1 week. Leonard Schwartz Wednesday 09/10/2022 130pm room 8 Return A ppointment in 2 weeks. Leonard Schwartz Wednesday 09/17/2022 215pm room 8 Return A Other: - ***Change daily. ***** Anesthetic (In clinic) Topical Lidocaine 5% applied to wound bed Bathing/ Shower/ Hygiene May shower with protection but do not get wound dressing(s) wet. Protect dressing(s) with water repellant cover (for example, large plastic bag) or a cast cover and may then take shower. Edema Control - Lymphedema / SCD / Other Lymphedema Pumps. Use Lymphedema pumps on leg(s) 2-3 times a day for 45-60 minutes. If wearing any wraps or hose, do not remove them. Continue exercising as instructed. - *****pump 3 times a day for an hour each time.***** Elevate legs to the level of the heart or above for 30 minutes daily and/or when sitting for 3-4 times a day throughout the day. - important Avoid standing for long periods of time. Exercise regularly Compression stocking or Garment 30-40 mm/Hg pressure to: - juxtafit Essentials Long for bilateral lower legs; quantity three juxtafit essentials [name] [name] ([id number]) [id number] Page 5 of 10 per  limb. Wound Treatment Wound #6 - Lower Leg Wound Laterality: Right, Medial Cleanser: Soap and Water 1 x Per Day/30 Days Discharge Instructions: May shower and wash wound with dial antibacterial soap and water prior to dressing change. Peri-Wound Care: Sween Lotion (Moisturizing lotion) 1 x Per Day/30 Days Discharge Instructions: Apply moisturizing lotion as directed Prim Dressing: Maxorb Extra Ag+ Alginate Dressing, 4x4.75 (in/in) 1 x Per Day/30 Days ary Discharge Instructions: Apply to wound bed as instructed Prim Dressing: Compounding topical antibiotics- [facility] 1 x Per Day/30 Days ary Discharge Instructions: apply under alignate Ag when patient brings with appts. Secondary Dressing: ABD Pad, 5x9 1 x Per Day/30 Days Discharge Instructions: Apply over primary dressing as directed. Secondary Dressing: Woven Gauze Sponge, Non-Sterile 4x4 in 1 x Per Day/30 Days Discharge Instructions: Apply over primary dressing as directed. Secured With: [employer], 4.5x3.1 (in/yd) 1 x Per Day/30 Days Discharge Instructions: Secure with Kerlix as directed. Secured With: 24M Medipore H Soft Cloth Surgical T ape, 4 x 10 (in/yd) 1 x Per Day/30 Days Discharge Instructions: Secure with tape as directed. Compression Wrap: Juxtafit 30-79mmHg 1 x Per Day/30 Days Discharge Instructions: apply in the morning and remove at night. Wound #7 - Lower Leg Wound Laterality: Left, Medial Cleanser: Soap and Water 1 x Per Day/30 Days Discharge Instructions: May shower and wash wound with dial antibacterial soap and water prior to dressing change. Peri-Wound Care: Sween Lotion (Moisturizing lotion) 1 x Per Day/30 Days Discharge Instructions: Apply moisturizing lotion as directed Prim Dressing: Maxorb Extra Ag+ Alginate Dressing, 4x4.75 (in/in) 1 x Per Day/30 Days ary Discharge Instructions: Apply to wound bed as instructed Prim Dressing: Compounding topical antibiotics- [facility] 1 x Per Day/30  Days ary Discharge Instructions: apply under alignate Ag when patient brings with appts. Secondary Dressing: ABD Pad, 5x9 1 x Per Day/30 Days Discharge Instructions: Apply over primary dressing as directed. Secondary Dressing: Woven Gauze Sponge, Non-Sterile 4x4 in 1 x Per Day/30 Days Discharge Instructions: Apply over primary dressing as directed. Secured With: [employer], 4.5x3.1 (in/yd) 1 x Per Day/30 Days Discharge Instructions: Secure with Kerlix as directed. Secured With: 24M Medipore H Soft Cloth Surgical T ape, 4 x  10 (in/yd) 1 x Per Day/30 Days Discharge Instructions: Secure with tape as directed. Compression Wrap: Juxtafit 30-48mmHg 1 x Per Day/30 Days Discharge Instructions: apply in the morning and remove at night. Electronic Signature(s) Unsigned Entered By: [name] on 09/03/2022 14:16:38 -------------------------------------------------------------------------------- Problem List Details Patient Name: Date of Service: Gleed, DA [name] 09/03/2022 1:30 PM Medical Record Number: [id number] Patient Account Number: [id number] Date of Birth/Sex: Treating RN: 1945/06/22 (77 y.o. M) Primary Care Provider: [name] Other Clinician: Wieman, [name] [name] ([id number]) [id number] Page 6 of 10 Referring Provider: Treating Provider/Extender: [name] in Treatment: 23 Active Problems ICD-10 Encounter Code Description Active Date MDM Diagnosis I87.333 Chronic venous hypertension (idiopathic) with ulcer and inflammation of 06/11/2022 No Yes bilateral lower extremity I89.0 Lymphedema, not elsewhere classified 03/26/2022 No Yes L97.828 Non-pressure chronic ulcer of other part of left lower leg with other specified 03/26/2022 No Yes severity 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 other part of right lower leg with other specified 03/26/2022 No Yes severity Inactive Problems Resolved Problems Electronic  Signature(s) Signed: 09/03/2022 1:58:40 PM By: [name] PA-C Entered By: [name] on 09/03/2022 13:58:39 -------------------------------------------------------------------------------- Progress Note Details Patient Name: Date of Service: Manfredi, DA [name] 09/03/2022 1:30 PM Medical Record Number: [id number] Patient Account Number: [id number] Date of Birth/Sex: Treating RN: April 13, 1946 (77 y.o. M) Primary Care Provider: [name] Other Clinician: Referring Provider: Treating Provider/Extender: [name] Weeks in Treatment: 23 Subjective Chief Complaint Information obtained from Patient 02/23/2020; patient is here for wounds on his bilateral lower legs in the setting of severe lymphedema 03/26/2022; patient is here for wounds on his bilateral lower legs medial aspect History of Present Illness (HPI) ADMISSION 02/23/2020 Patient is a 76 year old man who lives in [place] who arrives accompanied by his wife. He has a history of chronic lymphedema and venous insufficiency in his bilateral lower legs which may have something to do that with having a history of DVT as well as being treated for prostate cancer. In any case he recently got compression pumps at home but compliance has been an issue here. He has compression stockings however they are probably not sufficient enough to control swelling. They tell us that things deteriorated for him in late August he was admitted to [facility] for 7 days. This was with cellulitis I think of his bilateral lower legs. Discharge he was noted to have wounds on his bilateral lower legs. He was discharged on Bactrim. They tried to get him home health through [facility] of course they declined him. His wife is been wrapping these applying some form of silver foam dressing. He has a history of wounds before although nothing that would not heal with basic home topical dressings. He has 2 areas on the left  medial, left anterior and left lateral and a smaller area on the right medial. All of these have considerable depth. Past medical history includes iron deficiency anemia, lymphedema followed by the rehab center at [facility] with lymphedema wraps I believe, DVT on chronic anticoagulation, prostate cancer, chronic venous insufficiency, hypertension. As mentioned he has compression pumps but does not use them. ABIs in our clinic were noncompressible bilaterally 10/14; patient with severe bilateral lymphedema right greater than left. He came in with bilateral lower extremity wounds left greater than right. Even though the right side has more of the edema most of the wounds here almost closed on the right medial. He has 3 remaining wounds  on the left Edmister, Jahred ([id number]) [id number] Page 7 of 10 We have been using silver alginate under 4-layer compression I have been trying to get him to be compliant with his external compression pumps 10/21; patient with 3 small wounds on the left leg and 1 on the right medial in the setting of severe lymphedema and chronic venous insufficiency. We have been using silver alginate under 4-layer compression he is using his external compression pumps twice a day 11/4; ARTERIAL STUDIES on the right show an ABI of 1.02 TBI of 0.858 with biphasic waveforms on the left 0.98 with a TBI of 0.55 and biphasic waveforms. Does not look like he has significant arterial disease. We are treating him for lymphedema he has compression pumps. He has punched-out areas on the left anterior left lateral and right medial lower extremities 11/11; after we obtained his arterial studies I put him in 4 layer compression. He is using his compression pumps probably once a day although I have asked him to do twice. Primary dressing to the wound is silver collagen he has severe lymphedema likely secondary to chronic venous insufficiency. Wounds on the left lateral,  left medial and left anterior and a small area on the right medial 12/2; the area on the right anterior lower leg has healed. We initially thought that the area medially had healed as well however when her discharge nurse came in she detected fluid in the wound simply opened up. This is actually worse than I remember this pain. The area on the left lateral potentially slightly smaller He is also complaining about pain in his left hand he says that this is actually been getting some better he has been using topical creams on this. She asked that I look at this 12/9 after last weeks issues we have 2 wounds one on the right medial lower leg and 1 on the left lateral. Both of these are in the same condition. I think because of thickened skin secondary to chronic lymphedema these wounds actually have depth of almost 0.8 cm. 12/16; the patient has 2 small but deep wounds one on the right medial and one on the left lateral. The right medial is actually the worst of these. He arrives in clinic today with absolutely terrible edema in the right leg apparently his 4-layer wrap fell down to just above his ankle he did not think about this he is apparently been continuing to use his compression pump twice a day. The left leg looks a lot better. 05/09/2020 upon evaluation today patient appears to be doing decently well in regard to his wounds. Everything is measuring smaller the right leg still has a little bit deeper wound in the left seems to be almost completely healed in my opinion I am very pleased in general with how things are progressing. He has a 4- layer compression wrap we have been using endoform today we will probably have to use collagen just based on the fact that we do not have endoform it is on order. 1/6; the patient's wound on the left lateral lower leg has healed. Still has 1 on the right medial. 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 Using compression pumps at home twice a  day. 1/13; left lateral lower leg is still healed. He has a deep punched out rectangular shaped wound on the right medial calf. Looking down at this it appears that he is attempting to epithelialize around the edges of the wound and on the base  as well. His edema is reasonably well controlled we have been using collagen with absolutely no effect 1/20; left lateral lower leg remains closed he has extremitease stockings. The area on the right medial calf I aggressively debrided last week measures larger but the surface looks better. We have been using Hydrofera Blue. We ran Oasis through his insurance but we have not seen the results of this 1/27; left lower leg wound with chronic venous insufficiency and secondary lymphedema. I did aggressive debridement on this last week the wound seems to have come in healthy looking surface using Hydrofera Blue. He was denied for Oasis 2/3; small divot in the right medial lower leg. Under illumination the walls of this divot are epithelialized however the base has slough which I removed with a curette we have been using Hydrofera Blue 2/10 small divot on the right medial lower leg pinpoint illumination at the base of this cone-shaped wound. We have been using Hydrofera Blue but I will switch to calcium alginate this week 2/17; the small divot on the right medial lower leg is fully epithelialized. There is no visible open area under illumination. He has his own stocking for the right leg similar to the one he has been wearing on the left. 03/26/2022; READMISSION This is a now 77 year old man that we had in the clinic from 02/23/2020 through 07/05/2020. At that point he had bilateral lower extremity wounds left greater than right in the setting of severe lymphedema. He had already obtained compression pumps ordered for him I think from the wound care clinic in [facility] so I do not really have record of what he has been using. He claims to be using them  once a day but there is a problem with the sleeve on the left leg. About 2 weeks ago he was hospitalized from 03/11/2022 through 03/14/2022 with diastolic congestive heart failure. 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 He was diuresed. Developed some prerenal azotemia and he has not been taking any diuretics currently. He has not been putting stockings on his legs since he got out of hospital and still has his legs dependent for long periods. Past medical history history of prostate cancer treated with prostatectomy and radiation this was apparently about 8 years ago, history of DVT on chronic Coumadin, history of lymphedema was managed for a while at the clinic in [place]. History of inguinal hernia repair in September 22, hypertension, stage IIIb chronic renal failure ABIs today were noncompressible on the right 1.12 on the left 04-02-2022 upon evaluation today patient appears to be doing well currently in regard to his legs I do feel like both areas that are draining are actually much drier than they were in the picture last week although the left is drier than the right. He is tolerating the 4-layer compression wraps at this point he did contact the pump company and they are actually working on getting him a new compression sleeve for one of his legs which have previously popped and was not functioning properly. 04-23-2022 upon evaluation today patient appears to be doing well currently in regard to his wounds on the legs. I am actually very pleased with where things stand and I do feel like that we are headed in the right direction. Fortunately there is no sign of active infection locally or systemically at this time. 05-07-2022 upon evaluation today patient appears to be doing well currently in regard to his wounds in  fact things are showing signs of improvement which is good news I do not see too much that actually appears to be open and I am  very pleased in that regard. No fevers, chills, nausea, vomiting, or diarrhea. 05-21-2022 upon evaluation today patient appears to be doing somewhat poorly in regard to drainage of his lower extremities bilaterally. The right is greater than left as far as the weeping area. Nonetheless it seems to be getting worse not better. He actually has pitting edema which is at least 2+ to the thighs and I am concerned about the fact that he is may be fluid overloaded in general and that is the reason why we cannot get this under control. I know he is not using his pumps all the time because he actually told the nurse that he was either going to pump or he was going to use his fluid pills but not do both. For that reason I do think that he needs to be really doing both in order to get the fluid out as effectively as possible obviously with the 4-layer compression wraps were doing as much as we can from a compression standpoint but it is really not enough. He tells me that he elevates his leg is much as he can in between pumping and other activity throughout the day. 05-28-2022 upon evaluation today patient appears to be doing better in regard to his wounds although the measurements may be a little bit larger this is a very difficult wound to heal it is very indistinct in a lot of areas. Nonetheless there is can be some need for sharp debridement in regard to both medial and lateral legs. Fortunately I see no signs of active infection locally nor systemically at this time. No fevers, chills, nausea, vomiting, or diarrhea. 06-04-2022 upon evaluation today patient appears to be doing poorly in general in regard to the wounds on his legs. He still continues to have a tremendous amount of fluid not just in the lower portion of his leg but to be honest his thigh where he has 2-3+ pitting edema in the thigh as well. Unfortunately I do not know that we will be able to get this healed effectively and keep it healed on the  lower extremities unless he gets the overall fluid situation taking and under control. Fortunately I do not see any signs of infection locally nor systemically which is great news. He just seems to be very fluid overloaded. 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 He has been tolerating the dressing changes with the compression wraps without complication. Fortunately I do not see any evidence of infection at this time which is great news. No fevers, chills, nausea, vomiting, or diarrhea. Gosney, [name] [name] ([id number]) [id number] Page 8 of 10 06-18-2022 upon evaluation today patient appears to be doing well currently in regard to his wounds as far as not looking like they are terribly infected but nonetheless I am concerned about a subacute infection secondary to the fact that he continues to have spreading despite the compression therapy. We actually did do an Unna boot on him last week this is actually the first wrap that actually stayed up everything else has been sliding down quite significantly. Fortunately there does not appear to be any signs of infection systemically at this time. With that being said I do believe that locally there seems to be an issue going on here and again I  Ernie Hew do a PCR culture to see what that shows also think that I am going to put him on a broad-spectrum antibiotic, doxycycline to see how that will help as well. He does tell me that coming into the clinic today that he was feeling short of breath like "he was about to have a heart attack" because he was having such a hard time breathing. He says that he told this to Dr. Jodelle Green his cardiologist as well when he was evaluated in the past 1 to 2 weeks. 07-02-2022 upon evaluation today patient appears to be doing poorly currently in regard to his wound. He has been tolerating the  dressing changes. Unfortunately he has not had any compression wraps on for the past week because he was unable to make it in for his appointment last week. With that being said he has a significant amount of drainage he tells me has been using his pumps but despite this in the pumps he still has been draining quite a bit. The drainage is also somewhat purulent unfortunately. We did attempt to get in touch with his cardiologist last week unfortunately we were unable to get up with him I did advise that the patient needs to get in touch with him upon leaving today in order to make sure they know he is on the new antibiotics I am going to send him this will be Levaquin and Augmentin. 07-09-2022 upon evaluation today patient appears to be doing about the same in regard to his legs he may have just a slight amount of improvement with regard to the drainage probably [facility] topical antibiotics are helping in this regard to some degree. Fortunately there does not appear to be any signs of active infection systemically which is great news. No fevers, chills, nausea, vomiting, or diarrhea. 07-16-2022 upon evaluation today patient appears to be doing well currently in regard to his wound. He has been tolerating the dressing changes without complication. Fortunately there does not appear to be any signs of active infection locally nor systemically at this time. With that being said he cannot keep the wraps up he tells me on the left side he had to cut this down because it got too tight. He has been using his pumps but he is on the right side the wrap actually straight down causing some pushing around the central part of his leg just below the calf I think this is a bigger risk for him that help at this point. I think that we may need to try something different he should be getting his compression socks shortly he tells me they were ordered last Thursday. 3/6; ; this is a patient who lives in [place]. He has  severe bilateral lymphedema. He has compression pumps, we have been using kerlix Ace wrap [facility]. He is changing the dressing. We do not have home health. 08-06-2022 upon evaluation today patient appears to be doing a little better in regard to his wounds in general at this point. Fortunately there does not appear to be any signs of active infection locally nor systemically at this time which is great news and overall I am extremely pleased with where we stand today. 08-13-2022 upon evaluation today patient appears to actually be doing significantly better compared to last week. He actually did go to the hospital I told him that he needed to when he left here and he actually did go. With that being said they actually ended up admitting him he was having shortness  of breath and I thought it might be related to congestive heart failure turns out he actually had a pulmonary embolism. Subsequently they were able to get him off of the Coumadin switching over to Eliquis to get things stabilized in that regard they also had them wrapped and got his swelling under control on his legs he actually looks much better pretty much across the board at this point. I am very pleased in that regard. With that being said I am very happy that he finally went that could have been a very dangerous situation. 08-20-2022 upon evaluation today patient appears to be doing well currently in regard to his wound. Has been tolerating the dressing changes without complication. Fortunately there does not appear to be any signs of active infection locally nor systemically at this time. I think his legs are doing better there is some need for sharp debridement today. 08-27-2022 upon evaluation patient is actually making excellent progress. I am actually very pleased with where he stands and I think that he is moving in the right direction. In general I think that we are looking pretty good at the moment. 09-03-2022 upon evaluation today  patient appears to be doing well currently in regard to his wound. He is actually tolerating dressing changes on the left and right leg without complication. Fortunately I do not see any need for debridement of the left leg the right leg I think we probably do some need to perform some debridement here. Objective Constitutional Chronically ill appearing but in no apparent acute distress. Vitals Time Taken: 2:06 PM, Height: 74 in, Weight: 250 lbs, BMI: 32.1, Temperature: 97.9 F, Pulse: 101 bpm, Respiratory Rate: 20 breaths/min, Blood Pressure: 182/81 mmHg. Respiratory normal breathing without difficulty. Psychiatric this patient is able to make decisions and demonstrates good insight into disease process. Alert and Oriented x 3. pleasant and cooperative. General Notes: Upon inspection patient's wound bed actually showed signs of good granulation epithelization at this point. Fortunately I do not see any signs of active infection which is great news. No fevers, chills, nausea, vomiting, or diarrhea. Integumentary (Hair, Skin) Wound #6 status is Open. Original cause of wound was Gradually Appeared. The date acquired was: 03/05/2022. The wound has been in treatment 23 weeks. The wound is located on the Right,Medial Lower Leg. The wound measures 8.4cm length x 3.9cm width x 0.1cm depth; 25.73cm^2 area and 2.573cm^3 volume. There is Fat Layer (Subcutaneous Tissue) exposed. There is a medium amount of serosanguineous drainage noted. The wound margin is distinct with the outline attached to the wound base. There is no granulation within the wound bed. There is a large (67-100%) amount of necrotic tissue within the wound bed including Adherent Slough. The periwound skin appearance exhibited: Scarring, Hemosiderin Staining. The periwound skin appearance did not exhibit: Callus, Crepitus, Excoriation, Induration, Rash, Dry/Scaly, Maceration, Atrophie Blanche, Cyanosis, Ecchymosis, Mottled, Pallor, Rubor,  Erythema. Wound #7 status is Open. Original cause of wound was Gradually Appeared. The date acquired was: 03/05/2022. The wound has been in treatment 23 weeks. The wound is located on the Left,Medial Lower Leg. The wound measures 2.4cm length x 1.3cm width x 0.1cm depth; 2.45cm^2 area and 0.245cm^3 volume. There is Fat Layer (Subcutaneous Tissue) exposed. There is a medium amount of serosanguineous drainage noted. The wound margin is distinct with the outline attached to the wound base. There is large (67-100%) red, pink granulation within the wound bed. There is a small (1-33%) amount of necrotic tissue within the wound bed including  Adherent Slough. The periwound skin appearance exhibited: Scarring, Maceration, Hemosiderin Staining. The periwound skin appearance did [name] [name] ([id number]) [id number] Page 9 of 10 not exhibit: Callus, Crepitus, Excoriation, Induration, Rash, Dry/Scaly, Atrophie Blanche, Cyanosis, Ecchymosis, Mottled, Pallor, Rubor, Erythema. 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 There was a Excisional Skin/Subcutaneous Tissue Debridement with a total area of 15 sq cm performed by [name], PA. With the following instrument(s): Curette to remove Viable and Non-Viable tissue/material. Material removed includes Subcutaneous Tissue, Slough, and Biofilm after achieving pain control using Lidocaine 4% Topical Solution. A time out was conducted at 14:10, prior to the start of the procedure. A Minimum amount of bleeding was  controlled with Pressure. The procedure was tolerated well with a pain level of 0 throughout and a pain level of 0 following the procedure. Post Debridement Measurements: 8.4cm length x 3.9cm width x 0.2cm depth; 5.146cm^3 volume. Character of Wound/Ulcer Post Debridement is improved. Post procedure Diagnosis Wound #6: Same as Pre-Procedure Plan Follow-up Appointments: Return Appointment in 1 week. Leonard Schwartz Wednesday 09/10/2022 130pm room 8 Return Appointment in 2 weeks. Leonard Schwartz Wednesday 09/17/2022 215pm room 8 Other: - ***Change daily. ***** Anesthetic: (In clinic) Topical Lidocaine 5% applied to wound bed Bathing/ Shower/ Hygiene: May shower with protection but do not get wound dressing(s) wet. Protect dressing(s) with water repellant cover (for example, large plastic bag) or a cast cover and may then take shower. Edema Control - Lymphedema / SCD / Other: Lymphedema Pumps. Use Lymphedema pumps on leg(s) 2-3 times a day for 45-60 minutes. If wearing any wraps or hose, do not remove them. Continue exercising as instructed. - *****pump 3 times a day for an hour each time.***** Elevate legs to the level of the heart or above for 30 minutes daily and/or when sitting for 3-4 times a day throughout the day. - important Avoid standing for long periods of time. Exercise regularly Compression stocking or Garment 30-40 mm/Hg pressure to: - juxtafit Essentials Long for bilateral lower legs; quantity three juxtafit essentials per limb. WOUND #6: - Lower Leg Wound Laterality: Right, Medial Cleanser: Soap and Water 1 x Per Day/30 Days Discharge Instructions: May shower and wash wound with dial antibacterial soap and water prior to dressing change. Peri-Wound Care: Sween Lotion (Moisturizing lotion) 1 x Per Day/30 Days Discharge Instructions: Apply moisturizing lotion as directed Prim Dressing: Maxorb Extra Ag+ Alginate Dressing, 4x4.75 (in/in) 1 x Per Day/30 Days ary Discharge Instructions: Apply to wound  bed as instructed Prim Dressing: Compounding topical antibiotics- [facility] 1 x Per Day/30 Days ary Discharge Instructions: apply under alignate Ag when patient brings with appts. Secondary Dressing: ABD Pad, 5x9 1 x Per Day/30 Days Discharge Instructions: Apply over primary dressing as directed. Secondary Dressing: Woven Gauze Sponge, Non-Sterile 4x4 in 1 x Per Day/30 Days Discharge Instructions: Apply over primary dressing as directed. Secured With: [employer], 4.5x3.1 (in/yd) 1 x Per Day/30 Days Discharge Instructions: Secure with Kerlix as directed. Secured With: 17M Medipore H Soft Cloth Surgical T ape, 4 x 10 (in/yd) 1 x Per Day/30 Days Discharge Instructions: Secure with tape as directed. Com pression Wrap: Juxtafit 30-50mmHg 1  x Per Day/30 Days Discharge Instructions: apply in the morning and remove at night. WOUND #7: - Lower Leg Wound Laterality: Left, Medial Cleanser: Soap and Water 1 x Per Day/30 Days Discharge Instructions: May shower and wash wound with dial antibacterial soap and water prior to dressing change. Peri-Wound Care: Sween Lotion (Moisturizing lotion) 1 x Per Day/30 Days Discharge Instructions: Apply moisturizing lotion as directed Prim Dressing: Maxorb Extra Ag+ Alginate Dressing, 4x4.75 (in/in) 1 x Per Day/30 Days ary Discharge Instructions: Apply to wound bed as instructed Prim Dressing: Compounding topical antibiotics- [facility] 1 x Per Day/30 Days ary Discharge Instructions: apply under alignate Ag when patient brings with appts. Secondary Dressing: ABD Pad, 5x9 1 x Per Day/30 Days Discharge Instructions: Apply over primary dressing as directed. Secondary Dressing: Woven Gauze Sponge, Non-Sterile 4x4 in 1 x Per Day/30 Days Discharge Instructions: Apply over primary dressing as directed. Secured With: [employer], 4.5x3.1 (in/yd) 1 x Per Day/30 Days [place], [name] [name] ([id number]) [id number] Page 10  of 10 Discharge Instructions: Secure with Kerlix as directed. Secured With: 62M Medipore H Soft Cloth Surgical T ape, 4 x 10 (in/yd) 1 x Per Day/30 Days Discharge Instructions: Secure with tape as directed. Compression Wrap: Juxtafit 30-72mmHg 1 x Per Day/30 Days Discharge Instructions: apply in the morning and remove at night. 1. Based on what I am seeing I do believe that the patient would benefit from a continuation of therapy currently wound care measures as before he is in agreement with this plan this is going to include the use of the with the juxta lite compression wraps which I think are doing a really good job here for him. 2. I am also going to recommend that we have him continue with the silver alginate dressing which I think is doing a really good job. 3. Will also can have him continue to elevate his legs is much as he can also the more this he can do the better. We will see patient back for reevaluation in 1 week here in the clinic. If anything worsens or changes patient will contact our office for additional recommendations. Electronic Signature(s) Signed: 09/03/2022 2:33:27 PM By: [name] PA-C Entered By: [name] on 09/03/2022 14:33:27 -------------------------------------------------------------------------------- SuperBill Details Patient Name: Date of Service: Carmicheal, DA [name] 09/03/2022 Medical Record Number: [id number] Patient Account Number: [id number] Date of Birth/Sex: Treating RN: [birth year] (77 y.o. 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

## 2022-09-05 NOTE — Progress Notes (Signed)
[name], [name] [name] ([id number]) [id number] Page 1 of 7 Visit Report for 09/03/2022 Arrival Information Details Patient Name: Date of Service: [name], [name] 09/03/2022 1:30 PM Medical Record Number: [id number] Patient Account Number: [id number] Date of Birth/Sex: Treating RN: [birth year] (77 y.o. M) Primary Care [name]: [name] Other Clinician: Referring [name]: Treating [name]/Extender: [name] in Treatment: 23 Visit Information History Since Last Visit Added or deleted any medications: No Patient Arrived: Cane Any new allergies or adverse reactions: No Arrival Time: 14:05 Had a fall or experienced change in No Accompanied By: self activities of daily living that may affect Transfer Assistance: None risk of falls: Patient Identification Verified: Yes Signs or symptoms of abuse/neglect since last visito No Secondary Verification Process Completed: Yes Hospitalized since last visit: No Patient Requires Transmission-Based Precautions: No Implantable device outside of the clinic excluding No Patient Has Alerts: Yes cellular tissue based products placed in the center Patient Alerts: Patient on Blood Thinner since last visit: Right ABI in clinic [place] Has Dressing in Place as Prescribed: Yes Has Compression in Place as Prescribed: Yes Pain Present Now: No Electronic Signature(s) Signed: 09/05/2022 11:38:03 AM By: [name] Entered By: [name] on 09/03/2022 14:06:03 -------------------------------------------------------------------------------- Encounter Discharge Information Details Patient Name: Date of Service: [name], [name] 09/03/2022 1:30 PM Medical Record Number: [id number] Patient Account Number: [id number] Date of Birth/Sex: Treating RN: 1945-09-04 (77 y.o. Tammy [name] Primary Care Layani Foronda: [name] Other Clinician: Referring Holdan Stucke: Treating Filipe Greathouse/Extender: [name] Weeks in Treatment: 23 Encounter Discharge Information Items Post Procedure Vitals Discharge Condition: Stable Temperature (F): 97.9 Ambulatory Status: Cane Pulse (bpm): 101 Discharge Destination: Home Respiratory Rate (breaths/min): 20 Transportation: Private Auto Blood Pressure (mmHg): 182/81 Accompanied By: self Schedule Follow-up Appointment: Yes Clinical Summary of Care: Electronic Signature(s) Signed: 09/04/2022 2:41:21 PM By: [name] Stall RN, [name] Entered By: [name] on 09/03/2022 14:21:29 -------------------------------------------------------------------------------- Lower Extremity Assessment Details Patient Name: Date of Service: [name], [name] 09/03/2022 1:30 PM Medical Record Number: [id number] Patient Account Number: [id number] Date of Birth/Sex: Treating RN: 07-Aug-1945 (77 y.o. M) Primary Care Sulema Braid: [name] Other Clinician: Referring Barett Whidbee: Treating [name]/Extender: [name] Weeks in Treatment: 23 Edema Assessment J[Left: [name], [name] [name] ([id number])] Franne Forts: [id number] Page 2 of 7] Assessed: [Left: No] [Right: No] Edema: [Left: Yes] [Right: Yes] Calf Left: Right: Point of Measurement: 40 cm From Medial Instep 52.2 cm 61 cm Ankle Left: Right: Point of Measurement: 13 cm From Medial Instep 39.5 cm 43 cm Vascular Assessment Pulses: Dorsalis Pedis Palpable: [Left:Yes] [Right:Yes] Electronic Signature(s) Signed: 09/05/2022 11:38:03 AM By: [name] Entered By: [name] on 09/03/2022 14:06:35 -------------------------------------------------------------------------------- Multi-Disciplinary Care Plan Details Patient Name: Date of Service: [name], [name] 09/03/2022 1:30 PM Medical Record Number: [id number] Patient Account Number: [id number] Date of Birth/Sex: Treating RN: [birth year] (77 y.o. Tammy [name] Primary Care Wesly Whisenant: [name] Other  Clinician: Referring Kalyiah Saintil: Treating Seairra Otani/Extender: [name] Weeks in Treatment: 23 Active Inactive Wound/Skin Impairment Nursing Diagnoses: Impaired tissue integrity Knowledge deficit related to ulceration/compromised skin integrity Goals: Patient will have a decrease in wound volume by X% from date: (specify in notes) Date Initiated: 03/26/2022 Target Resolution Date: 09/19/2022 Goal Status: Active Patient/caregiver will verbalize understanding of skin care regimen Date Initiated: 03/26/2022 Target Resolution Date: 09/20/2022 Goal Status: Active Ulcer/skin breakdown will have a volume reduction of 30% by week 4 Date Initiated: 03/26/2022 Date Inactivated: 05/21/2022 Target Resolution Date:  05/17/2022 Unmet Reason: see wound Goal Status: Unmet measurement. Ulcer/skin breakdown will have a volume reduction of 50% by week 8 Date Initiated: 03/26/2022 Date Inactivated: 05/21/2022 Target Resolution Date: 05/17/2022 Unmet Reason: see wound Goal Status: Unmet measurement. Interventions: Assess patient/caregiver ability to obtain necessary supplies Assess patient/caregiver ability to perform ulcer/skin care regimen upon admission and as needed Assess ulceration(s) every visit Notes: Patient stated today, "I will take my fluid pill or pump not do both." [name] made aware. Electronic Signature(s) Signed: 09/04/2022 2:41:21 PM By: [name] Stall RN, [name] Entered By: [name] on 09/03/2022 14:12:48 [name], [name] [name] ([id number]) [id number] Page 3 of 7 -------------------------------------------------------------------------------- Pain Assessment Details Patient Name: Date of Service: [name], [name] 09/03/2022 1:30 PM Medical Record Number: [id number] Patient Account Number: [id number] Date of Birth/Sex: Treating RN: 01-Nov-1945 (77 y.o. M) Primary Care [name]: [name] Other Clinician: Referring Agron Swiney: Treating  [name]/Extender: [name] Weeks in Treatment: 23 Active Problems Location of Pain Severity and Description of Pain Patient Has Paino No Site Locations Pain Management and Medication Current Pain Management: Electronic Signature(s) Signed: 09/05/2022 11:38:03 AM By: [name] Entered By: [name] on 09/03/2022 14:06:49 -------------------------------------------------------------------------------- Patient/Caregiver Education Details Patient Name: Date of Service: [name], [name] 4/17/2024andnbsp1:30 PM Medical Record Number: [id number] Patient Account Number: [id number] Date of Birth/Gender: Treating RN: Jul 11, 1945 (77 y.o. Tammy [name] Primary Care Physician: [name] Other Clinician: Referring Physician: Treating Physician/Extender: [name] in Treatment: 86 Education Assessment Education Provided To: Patient Education Topics Provided Wound/Skin Impairment: Handouts: Caring for Your Ulcer Methods: Explain/Verbal Responses: Reinforcements needed Electronic Signature(s) Signed: 09/04/2022 2:41:21 PM By: [name] Stall RN, [name] Entered By: [name] on 09/03/2022 14:13:05 Mast, [name] [name] ([id number]) [id number] Page 4 of 7 -------------------------------------------------------------------------------- Wound Assessment Details Patient Name: Date of Service: [name], [name] 09/03/2022 1:30 PM Medical Record Number: [id number] Patient Account Number: [id number] Date of Birth/Sex: Treating RN: 11/14/1945 (77 y.o. M) Primary Care Shalan Neault: [name] Other Clinician: Referring Beena Catano: Treating Frantz Quattrone/Extender: [name] Weeks in Treatment: 23 Wound Status Wound Number: 6 Primary Lymphedema Etiology: Wound Location: Right, Medial Lower Leg Wound Open Wounding Event: Gradually Appeared Status: Date Acquired: 03/05/2022 Comorbid Anemia,  Lymphedema, Deep Vein Thrombosis, Hypertension, Weeks Of Treatment: 23 History: Received Radiation Clustered Wound: Yes Wound Measurements Length: (cm) Width: (cm) Depth: (cm) Clustered Quantity: Area: (cm) Volume: (cm) 8.4 % Reduction in Area: 82% 3.9 % Reduction in Volume: 82% 0.1 Epithelialization: Medium (34-66%) 1 25.73 2.573 Wound Description Classification: Full Thickness Without Exposed Sup Wound Margin: Distinct, outline attached Exudate Amount: Medium Exudate Type: Serosanguineous Exudate Color: red, brown port Structures Foul Odor After Cleansing: No Slough/Fibrino Yes Wound Bed Granulation Amount: None Present (0%) Exposed Structure Necrotic Amount: Large (67-100%) Fascia Exposed: No Necrotic Quality: Adherent Slough Fat Layer (Subcutaneous Tissue) Exposed: Yes Tendon Exposed: No Muscle Exposed: No Joint Exposed: No Bone Exposed: No Periwound Skin Texture Texture Color No Abnormalities Noted: No No Abnormalities Noted: No Callus: No Atrophie Blanche: No Crepitus: No Cyanosis: No Excoriation: No Ecchymosis: No Induration: No Erythema: No Rash: No Hemosiderin Staining: Yes Scarring: Yes Mottled: No Pallor: No Moisture Rubor: No No Abnormalities Noted: No Dry / Scaly: No Maceration: No Treatment Notes Wound #6 (Lower Leg) Wound Laterality: Right, Medial Cleanser Soap and Water Discharge Instruction: May shower and wash wound with dial antibacterial soap and water prior to dressing change. Peri-Wound Care Sween Lotion (Moisturizing lotion) Discharge Instruction: Apply moisturizing lotion as  directed Topical Primary Dressing Maxorb Extra Ag+ Alginate Dressing, 4x4.75 (in/in) Discharge Instruction: Apply to wound bed as instructed Compounding topical antibiotics- [facility] Hussar, [name] [name] ([id number]) [id number] Page 5 of 7 Discharge Instruction: apply under alignate Ag when patient brings with  appts. Secondary Dressing ABD Pad, 5x9 Discharge Instruction: Apply over primary dressing as directed. Woven Gauze Sponge, Non-Sterile 4x4 in Discharge Instruction: Apply over primary dressing as directed. Secured With [employer], 4.5x3.1 (in/yd) Discharge Instruction: Secure with Kerlix as directed. 44M Medipore H Soft Cloth Surgical T ape, 4 x 10 (in/yd) Discharge Instruction: Secure with tape as directed. Compression Wrap Juxtafit 30-24mmHg Discharge Instruction: apply in the morning and remove at night. Compression Stockings Add-Ons Electronic Signature(s) Signed: 09/04/2022 9:19:51 AM By: Karl Ito Entered By: Karl Ito on 09/03/2022 14:05:44 -------------------------------------------------------------------------------- Wound Assessment Details Patient Name: Date of Service: [name], [name] 09/03/2022 1:30 PM Medical Record Number: [id number] Patient Account Number: [id number] Date of Birth/Sex: Treating RN: Apr 08, 1946 (77 y.o. M) Primary Care Dorsie Burich: [name] Other Clinician: Referring Otha Rickles: Treating Taylinn Brabant/Extender: [name] Weeks in Treatment: 23 Wound Status Wound Number: 7 Primary Lymphedema Etiology: Wound Location: Left, Medial Lower Leg Wound Open Wounding Event: Gradually Appeared Status: Date Acquired: 03/05/2022 Comorbid Anemia, Lymphedema, Deep Vein Thrombosis, Hypertension, Weeks Of Treatment: 23 History: Received Radiation Clustered Wound: Yes Photos Wound Measurements Length: (cm) Width: (cm) Depth: (cm) Clustered Quantity: Area: (cm) Volume: (cm) 2.4 % Reduction in Area: 94.8% 1.3 % Reduction in Volume: 94.8% 0.1 Epithelialization: Large (67-100%) 1 2.45 0.245 Wound Description Classification: Full Thickness Without Exposed Support Structures Wound Margin: Distinct, outline attached Exudate Amount: Medium Exudate Type: Serosanguineous [name], [name] ([id number]) Exudate  Color: red, brown Foul Odor After Cleansing: No Slough/Fibrino Yes [phone].pdf Page 6 of 7 Wound Bed Granulation Amount: Large (67-100%) Exposed Structure Granulation Quality: Red, Pink Fascia Exposed: No Necrotic Amount: Small (1-33%) Fat Layer (Subcutaneous Tissue) Exposed: Yes Necrotic Quality: Adherent Slough Tendon Exposed: No Muscle Exposed: No Joint Exposed: No Bone Exposed: No Periwound Skin Texture Texture Color No Abnormalities Noted: No No Abnormalities Noted: No Callus: No Atrophie Blanche: No Crepitus: No Cyanosis: No Excoriation: No Ecchymosis: No Induration: No Erythema: No Rash: No Hemosiderin Staining: Yes Scarring: Yes Mottled: No Pallor: No Moisture Rubor: No No Abnormalities Noted: No Dry / Scaly: No Maceration: Yes Treatment Notes Wound #7 (Lower Leg) Wound Laterality: Left, Medial Cleanser Soap and Water Discharge Instruction: May shower and wash wound with dial antibacterial soap and water prior to dressing change. Peri-Wound Care Sween Lotion (Moisturizing lotion) Discharge Instruction: Apply moisturizing lotion as directed Topical Primary Dressing Maxorb Extra Ag+ Alginate Dressing, 4x4.75 (in/in) Discharge Instruction: Apply to wound bed as instructed Compounding topical antibiotics- [facility] Discharge Instruction: apply under alignate Ag when patient brings with appts. Secondary Dressing ABD Pad, 5x9 Discharge Instruction: Apply over primary dressing as directed. Woven Gauze Sponge, Non-Sterile 4x4 in Discharge Instruction: Apply over primary dressing as directed. Secured With [employer], 4.5x3.1 (in/yd) Discharge Instruction: Secure with Kerlix as directed. 44M Medipore H Soft Cloth Surgical T ape, 4 x 10 (in/yd) Discharge Instruction: Secure with tape as directed. Compression Wrap Juxtafit 30-66mmHg Discharge Instruction: apply in the morning and remove at night. Compression  Stockings Add-Ons Electronic Signature(s) Signed: 09/04/2022 9:19:51 AM By: Karl Ito Entered By: Karl Ito on 09/03/2022 14:06:13 [name], [name] [name] ([id number]) [id number] Page 7 of 7 -------------------------------------------------------------------------------- Vitals Details Patient Name: Date of Service: [name], [name] 09/03/2022 1:30 PM Medical Record Number: [id number] Patient  Account Number: [id number] Date of Birth/Sex: Treating RN: [birth year] (77 y.o. M) Primary Care Pravin Perezperez: [name] Other Clinician: Referring Jearlene Bridwell: Treating Doniel Maiello/Extender: [name] Weeks in Treatment: 23 Vital Signs Time Taken: 14:06 Temperature (F): 97.9 Height (in): 74 Pulse (bpm): 101 Weight (lbs): 250 Respiratory Rate (breaths/min): 20 Body Mass Index (BMI): 32.1 Blood Pressure (mmHg): 182/81 Reference Range: 80 - 120 mg / dl Electronic Signature(s) Signed: 09/04/2022 9:19:51 AM By: Karl Ito Entered By: Karl Ito on 09/03/2022 14:07:41

## 2022-09-10 ENCOUNTER — Encounter (HOSPITAL_BASED_OUTPATIENT_CLINIC_OR_DEPARTMENT_OTHER): Payer: Medicare Other | Admitting: Physician Assistant

## 2022-09-10 DIAGNOSIS — I87333 Chronic venous hypertension (idiopathic) with ulcer and inflammation of bilateral lower extremity: Secondary | ICD-10-CM | POA: Diagnosis not present

## 2022-09-11 NOTE — Progress Notes (Addendum)
Havens, [name] ([id number]) [id number] Page 1 of 11 Visit Report for 09/10/2022 Chief Complaint Document Details Patient Name: Date of Service: [name], [name] 09/10/2022 1:30 PM Medical Record Number: [id number] Patient Account Number: [id number] Date of Birth/Sex: Treating RN: [birth year] (77 y.o. M) Primary Care Provider: [name] Other Clinician: Referring Provider: Treating Provider/Extender: [name] Weeks in Treatment: 24 Information Obtained from: Patient Chief Complaint 02/23/2020; patient is here for wounds on his bilateral lower legs in the setting of severe lymphedema 03/26/2022; patient is here for wounds on his bilateral lower legs medial aspect Electronic Signature(s) Signed: 09/10/2022 1:29:21 PM By: [name] PA-C Entered By: [name] on 09/10/2022 13:29:20 -------------------------------------------------------------------------------- Debridement Details Patient Name: Date of Service: [name], [name] 09/10/2022 1:30 PM Medical Record Number: [id number] Patient Account Number: [id number] Date of Birth/Sex: Treating RN: 11/30/1945 (77 y.o. [name] Primary Care Provider: [name] Other Clinician: Referring Provider: Treating Provider/Extender: [name] Weeks in Treatment: 24 Debridement Performed for Assessment: Wound #6 Right,Medial Lower Leg Performed By: Physician [name], PA Debridement Type: Debridement Level of Consciousness (Pre-procedure): Awake and Alert Pre-procedure Verification/Time Out Yes - 14:25 Taken: Start Time: 14:26 Pain Control: Lidocaine 4% T opical Solution Percent of Wound Bed Debrided: 100% T Area Debrided (cm): otal 13.19 Tissue and other material debrided: Viable, Non-Viable, Slough, Skin: Dermis , Skin: Epidermis, Slough Level: Skin/Epidermis Debridement Description: Selective/Open Wound Instrument: Curette Bleeding:  None End Time: 14:29 Procedural Pain: 0 Post Procedural Pain: 0 Response to Treatment: Procedure was tolerated well Level of Consciousness (Post- Awake and Alert procedure): Post Debridement Measurements of Total Wound Length: (cm) 4.2 Width: (cm) 4 Depth: (cm) 0.1 Volume: (cm) 1.319 Character of Wound/Ulcer Post Debridement: Improved Post Procedure Diagnosis Same as Pre-procedure Electronic Signature(s) Signed: 09/10/2022 5:22:52 PM By: [name] PA-C Signed: 09/11/2022 4:51:53 PM By: [name] RN, [name] Entered By: [name] on 09/10/2022 14:30:11 [name], [name] ([id number]) [id number] Page 2 of 11 -------------------------------------------------------------------------------- Debridement Details Patient Name: Date of Service: [name], [name] 09/10/2022 1:30 PM Medical Record Number: [id number] Patient Account Number: [id number] Date of Birth/Sex: Treating RN: [birth year] (77 y.o. [name] Primary Care Provider: [name] Other Clinician: Referring Provider: Treating Provider/Extender: [name] Weeks in Treatment: 24 Debridement Performed for Assessment: Wound #7 Left,Medial Lower Leg Performed By: Physician [name], PA Debridement Type: Debridement Level of Consciousness (Pre-procedure): Awake and Alert Pre-procedure Verification/Time Out Yes - 14:25 Taken: Start Time: 14:26 Pain Control: Lidocaine 4% T opical Solution Percent of Wound Bed Debrided: 100% T Area Debrided (cm): otal 2.36 Tissue and other material debrided: Viable, Non-Viable, Slough, Skin: Dermis , Skin: Epidermis, Slough Level: Skin/Epidermis Debridement Description: Selective/Open Wound Instrument: Curette Bleeding: None End Time: 14:29 Procedural Pain: 0 Post Procedural Pain: 0 Response to Treatment: Procedure was tolerated well Level of Consciousness (Post- Awake and Alert procedure): Post Debridement  Measurements of Total Wound Length: (cm) 2.5 Width: (cm) 1.2 Depth: (cm) 0.1 Volume: (cm) 0.236 Character of Wound/Ulcer Post Debridement: Improved Post Procedure Diagnosis Same as Pre-procedure Electronic Signature(s) Signed: 09/10/2022 5:22:52 PM By: [name] PA-C Signed: 09/11/2022 4:51:53 PM By: [name] RN, [name] Entered By: [name] on 09/10/2022 14:30:29 -------------------------------------------------------------------------------- HPI Details Patient Name: Date of Service: [name], [name] 09/10/2022 1:30 PM Medical Record Number: [id number] Patient Account Number: [id number] Date of Birth/Sex: Treating RN: 01-06-1946 (77 y.o. M) Primary Care Provider: [name] Other Clinician: Referring Provider:  Treating Provider/Extender: [name] Weeks in Treatment: 24 History of Present Illness HPI Description: ADMISSION 02/23/2020 Patient is a 77 year old man who lives in [place] who arrives accompanied by his wife. He has a history of chronic lymphedema and venous insufficiency in his bilateral lower legs which may have something to do that with having a history of DVT as well as being treated for prostate cancer. In any case he recently got compression pumps at home but compliance has been an issue here. He has compression stockings however they are probably not sufficient enough to control swelling. They tell us that things deteriorated for him in late August he was admitted to [facility] for 7 days. This was with cellulitis I think of his bilateral lower legs. Discharge he was noted to have wounds on his bilateral lower legs. He was discharged on Bactrim. They tried to get him home health through [facility] of course they declined him. His wife is been wrapping these applying some form of silver foam dressing. He has a history of wounds before although nothing that would not heal with basic home topical dressings. He  has 2 areas on the left medial, left anterior and left lateral and a smaller area on the right medial. All of these have considerable depth. [name], [name] ([id number]) [id number] Page 3 of 11 Past medical history includes iron deficiency anemia, lymphedema followed by the rehab center at [facility] with lymphedema wraps I believe, DVT on chronic anticoagulation, prostate cancer, chronic venous insufficiency, hypertension. As mentioned he has compression pumps but does not use them. ABIs in our clinic were noncompressible bilaterally 10/14; patient with severe bilateral lymphedema right greater than left. He came in with bilateral lower extremity wounds left greater than right. Even though the right side has more of the edema most of the wounds here almost closed on the right medial. He has 3 remaining wounds on the left We have been using silver alginate under 4-layer compression I have been trying to get him to be compliant with his external compression pumps 10/21; patient with 3 small wounds on the left leg and 1 on the right medial in the setting of severe lymphedema and chronic venous insufficiency. We have been using silver alginate under 4-layer compression he is using his external compression pumps twice a day 11/4; ARTERIAL STUDIES on the right show an ABI of 1.02 TBI of 0.858 with biphasic waveforms on the left 0.98 with a TBI of 0.55 and biphasic waveforms. Does not look like he has significant arterial disease. We are treating him for lymphedema he has compression pumps. He has punched-out areas on the left anterior left lateral and right medial lower extremities 11/11; after we obtained his arterial studies I put him in 4 layer compression. He is using his compression pumps probably once a day although I have asked him to do twice. Primary dressing to the wound is silver collagen he has severe lymphedema likely secondary to chronic venous insufficiency.  Wounds on the left lateral, left medial and left anterior and a small area on the right medial 12/2; the area on the right anterior lower leg has healed. We initially thought that the area medially had healed as well however when her discharge nurse came in she detected fluid in the wound simply opened up. This is actually worse than I remember this pain. The area on the left lateral potentially slightly smaller He is also complaining about pain in  his left hand he says that this is actually been getting some better he has been using topical creams on this. She asked that I look at this 12/9 after last weeks issues we have 2 wounds one on the right medial lower leg and 1 on the left lateral. Both of these are in the same condition. I think because of thickened skin secondary to chronic lymphedema these wounds actually have depth of almost 0.8 cm. 12/16; the patient has 2 small but deep wounds one on the right medial and one on the left lateral. The right medial is actually the worst of these. He arrives in clinic today with absolutely terrible edema in the right leg apparently his 4-layer wrap fell down to just above his ankle he did not think about this he is apparently been continuing to use his compression pump twice a day. The left leg looks a lot better. 05/09/2020 upon evaluation today patient appears to be doing decently well in regard to his wounds. Everything is measuring smaller the right leg still has a little bit deeper wound in the left seems to be almost completely healed in my opinion I am very pleased in general with how things are progressing. He has a 4- layer compression wrap we have been using endoform today we will probably have to use collagen just based on the fact that we do not have endoform it is on order. 1/6; the patient's wound on the left lateral lower leg has healed. Still has 1 on the right medial. 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  Using compression pumps at home twice a day. 1/13; left lateral lower leg is still healed. He has a deep punched out rectangular shaped wound on the right medial calf. Looking down at this it appears that he is attempting to epithelialize around the edges of the wound and on the base as well. His edema is reasonably well controlled we have been using collagen with absolutely no effect 1/20; left lateral lower leg remains closed he has extremitease stockings. The area on the right medial calf I aggressively debrided last week measures larger but the surface looks better. We have been using Hydrofera Blue. We ran Oasis through his insurance but we have not seen the results of this 1/27; left lower leg wound with chronic venous insufficiency and secondary lymphedema. I did aggressive debridement on this last week the wound seems to have come in healthy looking surface using Hydrofera Blue. He was denied for Oasis 2/3; small divot in the right medial lower leg. Under illumination the walls of this divot are epithelialized however the base has slough which I removed with a curette we have been using Hydrofera Blue 2/10 small divot on the right medial lower leg pinpoint illumination at the base of this cone-shaped wound. We have been using Hydrofera Blue but I will switch to calcium alginate this week 2/17; the small divot on the right medial lower leg is fully epithelialized. There is no visible open area under illumination. He has his own stocking for the right leg similar to the one he has been wearing on the left. 03/26/2022; READMISSION This is a now 77 year old man that we had in the clinic from 02/23/2020 through 07/05/2020. At that point he had bilateral lower extremity wounds left greater than right in the setting of severe lymphedema. He had already obtained compression pumps ordered for him I think from the wound care clinic in [facility] so I do  not really have record of what he has  been using. He claims to be using them once a day but there is a problem with the sleeve on the left leg. About 2 weeks ago he was hospitalized from 03/11/2022 through 03/14/2022 with diastolic congestive heart failure. 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 He was diuresed. Developed some prerenal azotemia and he has not been taking any diuretics currently. He has not been putting stockings on his legs since he got out of hospital and still has his legs dependent for long periods. Past medical history history of prostate cancer treated with prostatectomy and radiation this was apparently about 8 years ago, history of DVT on chronic Coumadin, history of lymphedema was managed for a while at the clinic in [place]. History of inguinal hernia repair in September 22, hypertension, stage IIIb chronic renal failure ABIs today were noncompressible on the right 1.12 on the left 04-02-2022 upon evaluation today patient appears to be doing well currently in regard to his legs I do feel like both areas that are draining are actually much drier than they were in the picture last week although the left is drier than the right. He is tolerating the 4-layer compression wraps at this point he did contact the pump company and they are actually working on getting him a new compression sleeve for one of his legs which have previously popped and was not functioning properly. 04-23-2022 upon evaluation today patient appears to be doing well currently in regard to his wounds on the legs. I am actually very pleased with where things stand and I do feel like that we are headed in the right direction. Fortunately there is no sign of active infection locally or systemically at this time. 05-07-2022 upon evaluation today patient appears to be doing well currently in regard to his wounds in fact things are showing signs of improvement which is good news I do not see too much  that actually appears to be open and I am very pleased in that regard. No fevers, chills, nausea, vomiting, or diarrhea. 05-21-2022 upon evaluation today patient appears to be doing somewhat poorly in regard to drainage of his lower extremities bilaterally. The right is greater than left as far as the weeping area. Nonetheless it seems to be getting worse not better. He actually has pitting edema which is at least 2+ to the thighs and I am concerned about the fact that he is may be fluid overloaded in general and that is the reason why we cannot get this under control. I know he is not using his pumps all the time because he actually told the nurse that he was either going to pump or he was going to use his fluid pills but not do both. For that reason I do think that he needs to be really doing both in order to get the fluid out as effectively as possible obviously with the 4-layer compression wraps were doing as much as we can from a compression standpoint but it is really not enough. He tells me that he elevates his leg is much as he can in between pumping and other activity throughout the day. 05-28-2022 upon evaluation today patient appears to be doing better in regard to his wounds although the measurements may be a little bit larger this is a very difficult wound to heal it is very indistinct in a lot of areas. Nonetheless there is can be some  need for sharp debridement in regard to both medial and lateral legs. Fortunately I see no signs of active infection locally nor systemically at this time. No fevers, chills, nausea, vomiting, or diarrhea. [name], [name] ([id number]) [id number] Page 4 of 11 06-04-2022 upon evaluation today patient appears to be doing poorly in general in regard to the wounds on his legs. He still continues to have a tremendous amount of fluid not just in the lower portion of his leg but to be honest his thigh where he has 2-3+ pitting edema in the  thigh as well. Unfortunately I do not know that we will be able to get this healed effectively and keep it healed on the lower extremities unless he gets the overall fluid situation taking and under control. Fortunately I do not see any signs of infection locally nor systemically which is great news. He just seems to be very fluid overloaded. 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 He has been tolerating the dressing changes with the compression wraps without complication. Fortunately I do not see any evidence of infection at this time which is great news. No fevers, chills, nausea, vomiting, or diarrhea. 06-18-2022 upon evaluation today patient appears to be doing well currently in regard to his wounds as far as not looking like they are terribly infected but nonetheless I am concerned about a subacute infection secondary to the fact that he continues to have spreading despite the compression therapy. We actually did do an Unna boot on him last week this is actually the first wrap that actually stayed up everything else has been sliding down quite significantly. Fortunately there does not appear to be any signs of infection systemically at this time. With that being said I do believe that locally there seems to be an issue going on here and again I Ernie Hew do a PCR culture to see what that shows also think that I am going to put him on a broad-spectrum antibiotic, doxycycline to see how that will help as well. He does tell me that coming into the clinic today that he was feeling short of breath like "he was about to have a heart attack" because he was having such a hard time breathing. He says that he told this to Dr. Jodelle Green his cardiologist as well when he was evaluated in the past 1 to 2 weeks. 07-02-2022 upon evaluation today patient appears to be doing poorly currently in regard to his  wound. He has been tolerating the dressing changes. Unfortunately he has not had any compression wraps on for the past week because he was unable to make it in for his appointment last week. With that being said he has a significant amount of drainage he tells me has been using his pumps but despite this in the pumps he still has been draining quite a bit. The drainage is also somewhat purulent unfortunately. We did attempt to get in touch with his cardiologist last week unfortunately we were unable to get up with him I did advise that the patient needs to get in touch with him upon leaving today in order to make sure they know he is on the new antibiotics I am going to send him this will be Levaquin and Augmentin. 07-09-2022 upon evaluation today patient appears to be doing about the same in regard to his legs he may have just a slight amount of improvement with regard to the drainage probably  Keystone topical antibiotics are helping in this regard to some degree. Fortunately there does not appear to be any signs of active infection systemically which is great news. No fevers, chills, nausea, vomiting, or diarrhea. 07-16-2022 upon evaluation today patient appears to be doing well currently in regard to his wound. He has been tolerating the dressing changes without complication. Fortunately there does not appear to be any signs of active infection locally nor systemically at this time. With that being said he cannot keep the wraps up he tells me on the left side he had to cut this down because it got too tight. He has been using his pumps but he is on the right side the wrap actually straight down causing some pushing around the central part of his leg just below the calf I think this is a bigger risk for him that help at this point. I think that we may need to try something different he should be getting his compression socks shortly he tells me they were ordered last Thursday. 3/6; ; this is a patient  who lives in [place]. He has severe bilateral lymphedema. He has compression pumps, we have been using kerlix Ace wrap Keystone. He is changing the dressing. We do not have home health. 08-06-2022 upon evaluation today patient appears to be doing a little better in regard to his wounds in general at this point. Fortunately there does not appear to be any signs of active infection locally nor systemically at this time which is great news and overall I am extremely pleased with where we stand today. 08-13-2022 upon evaluation today patient appears to actually be doing significantly better compared to last week. He actually did go to the hospital I told him that he needed to when he left here and he actually did go. With that being said they actually ended up admitting him he was having shortness of breath and I thought it might be related to congestive heart failure turns out he actually had a pulmonary embolism. Subsequently they were able to get him off of the Coumadin switching over to Eliquis to get things stabilized in that regard they also had them wrapped and got his swelling under control on his legs he actually looks much better pretty much across the board at this point. I am very pleased in that regard. With that being said I am very happy that he finally went that could have been a very dangerous situation. 08-20-2022 upon evaluation today patient appears to be doing well currently in regard to his wound. Has been tolerating the dressing changes without complication. Fortunately there does not appear to be any signs of active infection locally nor systemically at this time. I think his legs are doing better there is some need for sharp debridement today. 08-27-2022 upon evaluation patient is actually making excellent progress. I am actually very pleased with where he stands and I think that he is moving in the right direction. In general I think that we are looking pretty good at the  moment. 09-03-2022 upon evaluation today patient appears to be doing well currently in regard to his wound. He is actually tolerating dressing changes on the left and right leg without complication. Fortunately I do not see any need for debridement of the left leg the right leg I think we probably do some need to perform some debridement here. 09-10-2022 upon evaluation today patient's wounds actually showed signs of improvement in both legs I do not see much is  going require debridement today which was great news. Fortunately I do not see any evidence of infection which I think is also excellent he seems to be using his pumps and doing everything right I am happy about how this is progressing at this point. Electronic Signature(s) Signed: 09/10/2022 4:54:42 PM By: [name] PA-C Entered By: [name] on 09/10/2022 16:54:42 -------------------------------------------------------------------------------- Physical Exam Details Patient Name: Date of Service: [name], [name] 09/10/2022 1:30 PM Medical Record Number: [id number] Patient Account Number: [id number] Date of Birth/Sex: Treating RN: 03/21/1946 (77 y.o. M) Primary Care Provider: [name] Other Clinician: Referring Provider: Treating Provider/Extender: [name] Weeks in Treatment: 24 Constitutional Well-nourished and well-hydrated in no acute distress. Respiratory normal breathing without difficulty. Couts, [name] ([id number]) [id number] Page 5 of 11 Psychiatric this patient is able to make decisions and demonstrates good insight into disease process. Alert and Oriented x 3. pleasant and cooperative. Notes Upon inspection patient's wound bed showed signs of good granulation and epithelization at this point. I do believe that he is making progress and I am very pleased in that regard I do not see any signs of overall worsening also this is great news. Electronic  Signature(s) Signed: 09/10/2022 4:55:06 PM By: [name] PA-C Entered By: [name] on 09/10/2022 16:55:06 -------------------------------------------------------------------------------- Physician Orders Details Patient Name: Date of Service: [name], [name] 09/10/2022 1:30 PM Medical Record Number: [id number] Patient Account Number: [id number] Date of Birth/Sex: Treating RN: [birth year] (77 y.o. 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 Leonard Schwartz Wednesday 09/17/2022 215pm room 8 Return A ppointment in 2 weeks. Leonard Schwartz Wednesday 09/24/2022 215pm room 8 Return A Other: - ***Change daily. ***** Anesthetic (In clinic) Topical Lidocaine 5% applied to wound bed Bathing/ Shower/ Hygiene May shower with protection but do not get wound dressing(s) wet. Protect dressing(s) with water repellant cover (for example, large plastic bag) or a cast cover and may then take shower. Edema Control - Lymphedema / SCD / Other Lymphedema Pumps. Use Lymphedema pumps on leg(s) 2-3 times a day for 45-60 minutes. If wearing any wraps or hose, do not remove them. Continue exercising as instructed. - *****pump 3 times a day for an hour each time.***** Elevate legs to the level of the heart or above for 30 minutes daily and/or when sitting for 3-4 times a day throughout the day. - important Avoid standing for long periods of  time. Exercise regularly Compression stocking or Garment 30-40 mm/Hg pressure to: - juxtafit Essentials Long for bilateral lower legs; quantity three juxtafit essentials per limb. Wound Treatment Wound #6 - Lower Leg Wound Laterality: Right, Medial Cleanser: Soap and Water 1 x Per Day/30 Days Discharge Instructions: May shower and wash wound with dial antibacterial soap and water prior to dressing change. Peri-Wound Care: Sween Lotion (Moisturizing lotion) 1 x Per Day/30 Days Discharge Instructions: Apply moisturizing lotion as directed Prim Dressing: Maxorb Extra Ag+ Alginate Dressing, 4x4.75 (in/in) 1 x Per Day/30 Days ary Discharge Instructions: Apply to wound bed as instructed  Prim Dressing: Compounding topical antibiotics- [facility] 1 x Per Day/30 Days ary Discharge Instructions: apply under alignate Ag when patient brings with appts. Secondary Dressing: ABD Pad, 5x9 1 x Per Day/30 Days Discharge Instructions: Apply over primary dressing as directed. Secondary Dressing: Woven Gauze Sponge, Non-Sterile 4x4 in 1 x Per Day/30 Days Discharge Instructions: Apply over primary dressing as directed. Secured With: [employer], 4.5x3.1 (in/yd) 1 x Per Day/30 Days Discharge Instructions: Secure with Kerlix as directed. [name], [name] ([id number]) [id number] Page 6 of 11 Secured With: 928M Medipore H Soft Cloth Surgical T ape, 4 x 10 (in/yd) 1 x Per Day/30 Days Discharge Instructions: Secure with tape as directed. Compression Wrap: Juxtafit 30-70mmHg 1 x Per Day/30 Days Discharge Instructions: apply in the morning and remove at night. Wound #7 - Lower Leg Wound Laterality: Left, Medial Cleanser: Soap and Water 1 x Per Day/30 Days Discharge Instructions: May shower and wash wound with dial antibacterial soap and water prior to dressing change. Peri-Wound Care: Sween Lotion (Moisturizing lotion) 1 x Per Day/30 Days Discharge Instructions: Apply  moisturizing lotion as directed Prim Dressing: Maxorb Extra Ag+ Alginate Dressing, 4x4.75 (in/in) 1 x Per Day/30 Days ary Discharge Instructions: Apply to wound bed as instructed Prim Dressing: Compounding topical antibiotics- [facility] 1 x Per Day/30 Days ary Discharge Instructions: apply under alignate Ag when patient brings with appts. Secondary Dressing: ABD Pad, 5x9 1 x Per Day/30 Days Discharge Instructions: Apply over primary dressing as directed. Secondary Dressing: Woven Gauze Sponge, Non-Sterile 4x4 in 1 x Per Day/30 Days Discharge Instructions: Apply over primary dressing as directed. Secured With: [employer], 4.5x3.1 (in/yd) 1 x Per Day/30 Days Discharge Instructions: Secure with Kerlix as directed. Secured With: 928M Medipore H Soft Cloth Surgical T ape, 4 x 10 (in/yd) 1 x Per Day/30 Days Discharge Instructions: Secure with tape as directed. Compression Wrap: Juxtafit 30-18mmHg 1 x Per Day/30 Days Discharge Instructions: apply in the morning and remove at night. Electronic Signature(s) Signed: 09/10/2022 5:22:52 PM By: [name] PA-C Signed: 09/11/2022 4:51:53 PM By: [name] RN, [name] Entered By: [name] on 09/10/2022 14:37:34 -------------------------------------------------------------------------------- Problem List Details Patient Name: Date of Service: [name], [name] 09/10/2022 1:30 PM Medical Record Number: [id number] Patient Account Number: [id number] Date of Birth/Sex: Treating RN: Apr 19, 1946 (77 y.o. M) Primary Care Provider: [name] Other Clinician: Referring Provider: Treating Provider/Extender: [name] in Treatment: 24 Active Problems ICD-10 Encounter Code Description Active Date MDM Diagnosis I87.333 Chronic venous hypertension (idiopathic) with ulcer and inflammation of 06/11/2022 No Yes bilateral lower extremity I89.0 Lymphedema, not elsewhere classified 03/26/2022 No Yes L97.828  Non-pressure chronic ulcer of other part of left lower leg with other specified 03/26/2022 No Yes severity 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 other part of right lower leg with other specified 03/26/2022 No Yes severity Behnke, Erian ([id number]) [id number] Page 7 of 11 Inactive Problems Resolved Problems Electronic Signature(s) Signed: 09/10/2022 1:29:13 PM By: [name] PA-C Entered By: [name] on 09/10/2022 13:29:13 -------------------------------------------------------------------------------- Progress Note Details Patient Name: Date of Service: [name], [name] 09/10/2022 1:30 PM Medical Record Number: [id number] Patient Account Number: [id number] Date of Birth/Sex: Treating RN: 1945/09/12 (77 y.o. M) Primary Care Provider: [name] Other Clinician: Referring Provider: Treating Provider/Extender: [name] Weeks in Treatment: 24 Subjective Chief Complaint Information obtained from Patient 02/23/2020; patient is here for wounds on his bilateral lower legs in the setting of severe lymphedema 03/26/2022; patient is  here for wounds on his bilateral lower legs medial aspect History of Present Illness (HPI) ADMISSION 02/23/2020 Patient is a 77 year old man who lives in [place] who arrives accompanied by his wife. He has a history of chronic lymphedema and venous insufficiency in his bilateral lower legs which may have something to do that with having a history of DVT as well as being treated for prostate cancer. In any case he recently got compression pumps at home but compliance has been an issue here. He has compression stockings however they are probably not sufficient enough to control swelling. They tell us that things deteriorated for him in late August he was admitted to [facility] for 7 days. This was with cellulitis I think of his bilateral lower legs. Discharge he was noted to have wounds on his bilateral lower  legs. He was discharged on Bactrim. They tried to get him home health through [facility] of course they declined him. His wife is been wrapping these applying some form of silver foam dressing. He has a history of wounds before although nothing that would not heal with basic home topical dressings. He has 2 areas on the left medial, left anterior and left lateral and a smaller area on the right medial. All of these have considerable depth. Past medical history includes iron deficiency anemia, lymphedema followed by the rehab center at [facility] with lymphedema wraps I believe, DVT on chronic anticoagulation, prostate cancer, chronic venous insufficiency, hypertension. As mentioned he has compression pumps but does not use them. ABIs in our clinic were noncompressible bilaterally 10/14; patient with severe bilateral lymphedema right greater than left. He came in with bilateral lower extremity wounds left greater than right. Even though the right side has more of the edema most of the wounds here almost closed on the right medial. He has 3 remaining wounds on the left We have been using silver alginate under 4-layer compression I have been trying to get him to be compliant with his external compression pumps 10/21; patient with 3 small wounds on the left leg and 1 on the right medial in the setting of severe lymphedema and chronic venous insufficiency. We have been using silver alginate under 4-layer compression he is using his external compression pumps twice a day 11/4; ARTERIAL STUDIES on the right show an ABI of 1.02 TBI of 0.858 with biphasic waveforms on the left 0.98 with a TBI of 0.55 and biphasic waveforms. Does not look like he has significant arterial disease. We are treating him for lymphedema he has compression pumps. He has punched-out areas on the left anterior left lateral and right medial lower extremities 11/11; after we obtained his arterial studies I put  him in 4 layer compression. He is using his compression pumps probably once a day although I have asked him to do twice. Primary dressing to the wound is silver collagen he has severe lymphedema likely secondary to chronic venous insufficiency. Wounds on the left lateral, left medial and left anterior and a small area on the right medial 12/2; the area on the right anterior lower leg has healed. We initially thought that the area medially had healed as well however when her discharge nurse came in she detected fluid in the wound simply opened up. This is actually worse than I remember this pain. The area on the left lateral potentially slightly smaller He is also complaining about pain in his left hand he says that this is actually been getting  some better he has been using topical creams on this. She asked that I look at this 12/9 after last weeks issues we have 2 wounds one on the right medial lower leg and 1 on the left lateral. Both of these are in the same condition. I think because of thickened skin secondary to chronic lymphedema these wounds actually have depth of almost 0.8 cm. 12/16; the patient has 2 small but deep wounds one on the right medial and one on the left lateral. The right medial is actually the worst of these. He arrives in clinic today with absolutely terrible edema in the right leg apparently his 4-layer wrap fell down to just above his ankle he did not think about this he is apparently been continuing to use his compression pump twice a day. The left leg looks a lot better. 05/09/2020 upon evaluation today patient appears to be doing decently well in regard to his wounds. Everything is measuring smaller the right leg still has a little bit deeper wound in the left seems to be almost completely healed in my opinion I am very pleased in general with how things are progressing. He has a 4- layer compression wrap we have been using endoform today we will probably have to use  collagen just based on the fact that we do not have endoform it is on order. 1/6; the patient's wound on the left lateral lower leg has healed. Still has 1 on the right medial. 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 Using compression pumps at home twice a day. 1/13; left lateral lower leg is still healed. He has a deep punched out rectangular shaped wound on the right medial calf. Looking down at this it appears that he is attempting to epithelialize around the edges of the wound and on the base as well. His edema is reasonably well controlled we have been using collagen with absolutely no effect [name], Ramal ([id number]) [id number] Page 8 of 11 1/20; left lateral lower leg remains closed he has extremitease stockings. The area on the right medial calf I aggressively debrided last week measures larger but the surface looks better. We have been using Hydrofera Blue. We ran Oasis through his insurance but we have not seen the results of this 1/27; left lower leg wound with chronic venous insufficiency and secondary lymphedema. I did aggressive debridement on this last week the wound seems to have come in healthy looking surface using Hydrofera Blue. He was denied for Oasis 2/3; small divot in the right medial lower leg. Under illumination the walls of this divot are epithelialized however the base has slough which I removed with a curette we have been using Hydrofera Blue 2/10 small divot on the right medial lower leg pinpoint illumination at the base of this cone-shaped wound. We have been using Hydrofera Blue but I will switch to calcium alginate this week 2/17; the small divot on the right medial lower leg is fully epithelialized. There is no visible open area under illumination. He has his own stocking for the right leg similar to the one he has been wearing on the left. 03/26/2022; READMISSION This is a now 77 year old man that we had in the  clinic from 02/23/2020 through 07/05/2020. At that point he had bilateral lower extremity wounds left greater than right in the setting of severe lymphedema. He had already obtained compression pumps ordered for him I think from the wound care clinic in [facility] so I do not really  have record of what he has been using. He claims to be using them once a day but there is a problem with the sleeve on the left leg. About 2 weeks ago he was hospitalized from 03/11/2022 through 03/14/2022 with diastolic congestive heart failure. 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 He was diuresed. Developed some prerenal azotemia and he has not been taking any diuretics currently. He has not been putting stockings on his legs since he got out of hospital and still has his legs dependent for long periods. Past medical history history of prostate cancer treated with prostatectomy and radiation this was apparently about 8 years ago, history of DVT on chronic Coumadin, history of lymphedema was managed for a while at the clinic in [place]. History of inguinal hernia repair in September 22, hypertension, stage IIIb chronic renal failure ABIs today were noncompressible on the right 1.12 on the left 04-02-2022 upon evaluation today patient appears to be doing well currently in regard to his legs I do feel like both areas that are draining are actually much drier than they were in the picture last week although the left is drier than the right. He is tolerating the 4-layer compression wraps at this point he did contact the pump company and they are actually working on getting him a new compression sleeve for one of his legs which have previously popped and was not functioning properly. 04-23-2022 upon evaluation today patient appears to be doing well currently in regard to his wounds on the legs. I am actually very pleased with where things stand and I do feel like  that we are headed in the right direction. Fortunately there is no sign of active infection locally or systemically at this time. 05-07-2022 upon evaluation today patient appears to be doing well currently in regard to his wounds in fact things are showing signs of improvement which is good news I do not see too much that actually appears to be open and I am very pleased in that regard. No fevers, chills, nausea, vomiting, or diarrhea. 05-21-2022 upon evaluation today patient appears to be doing somewhat poorly in regard to drainage of his lower extremities bilaterally. The right is greater than left as far as the weeping area. Nonetheless it seems to be getting worse not better. He actually has pitting edema which is at least 2+ to the thighs and I am concerned about the fact that he is may be fluid overloaded in general and that is the reason why we cannot get this under control. I know he is not using his pumps all the time because he actually told the nurse that he was either going to pump or he was going to use his fluid pills but not do both. For that reason I do think that he needs to be really doing both in order to get the fluid out as effectively as possible obviously with the 4-layer compression wraps were doing as much as we can from a compression standpoint but it is really not enough. He tells me that he elevates his leg is much as he can in between pumping and other activity throughout the day. 05-28-2022 upon evaluation today patient appears to be doing better in regard to his wounds although the measurements may be a little bit larger this is a very difficult wound to heal it is very indistinct in a lot of areas. Nonetheless there is can be some need for sharp  debridement in regard to both medial and lateral legs. Fortunately I see no signs of active infection locally nor systemically at this time. No fevers, chills, nausea, vomiting, or diarrhea. 06-04-2022 upon evaluation today patient  appears to be doing poorly in general in regard to the wounds on his legs. He still continues to have a tremendous amount of fluid not just in the lower portion of his leg but to be honest his thigh where he has 2-3+ pitting edema in the thigh as well. Unfortunately I do not know that we will be able to get this healed effectively and keep it healed on the lower extremities unless he gets the overall fluid situation taking and under control. Fortunately I do not see any signs of infection locally nor systemically which is great news. He just seems to be very fluid overloaded. 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 He has been tolerating the dressing changes with the compression wraps without complication. Fortunately I do not see any evidence of infection at this time which is great news. No fevers, chills, nausea, vomiting, or diarrhea. 06-18-2022 upon evaluation today patient appears to be doing well currently in regard to his wounds as far as not looking like they are terribly infected but nonetheless I am concerned about a subacute infection secondary to the fact that he continues to have spreading despite the compression therapy. We actually did do an Unna boot on him last week this is actually the first wrap that actually stayed up everything else has been sliding down quite significantly. Fortunately there does not appear to be any signs of infection systemically at this time. With that being said I do believe that locally there seems to be an issue going on here and again I Ernie Hew do a PCR culture to see what that shows also think that I am going to put him on a broad-spectrum antibiotic, doxycycline to see how that will help as well. He does tell me that coming into the clinic today that he was feeling short of breath like "he was about to have a heart attack" because he was  having such a hard time breathing. He says that he told this to Dr. Jodelle Green his cardiologist as well when he was evaluated in the past 1 to 2 weeks. 07-02-2022 upon evaluation today patient appears to be doing poorly currently in regard to his wound. He has been tolerating the dressing changes. Unfortunately he has not had any compression wraps on for the past week because he was unable to make it in for his appointment last week. With that being said he has a significant amount of drainage he tells me has been using his pumps but despite this in the pumps he still has been draining quite a bit. The drainage is also somewhat purulent unfortunately. We did attempt to get in touch with his cardiologist last week unfortunately we were unable to get up with him I did advise that the patient needs to get in touch with him upon leaving today in order to make sure they know he is on the new antibiotics I am going to send him this will be Levaquin and Augmentin. 07-09-2022 upon evaluation today patient appears to be doing about the same in regard to his legs he may have just a slight amount of improvement with regard to the drainage probably [facility] topical antibiotics are helping in this regard to some degree.  Fortunately there does not appear to be any signs of active infection systemically which is great news. No fevers, chills, nausea, vomiting, or diarrhea. 07-16-2022 upon evaluation today patient appears to be doing well currently in regard to his wound. He has been tolerating the dressing changes without complication. Fortunately there does not appear to be any signs of active infection locally nor systemically at this time. With that being said he cannot keep the wraps up he tells me on the left side he had to cut this down because it got too tight. He has been using his pumps but he is on the right side the wrap actually straight down causing some pushing around the central part of his leg just below the  calf I think this is a bigger risk for him that help at this point. I think that we may need to try something different he should be getting his compression socks shortly he tells me they were ordered last Thursday. 3/6; ; this is a patient who lives in [place]. He has severe bilateral lymphedema. He has compression pumps, we have been using kerlix Ace wrap [facility]. He is changing the dressing. We do not have home health. [name], [name] ([id number]) [id number] Page 9 of 11 08-06-2022 upon evaluation today patient appears to be doing a little better in regard to his wounds in general at this point. Fortunately there does not appear to be any signs of active infection locally nor systemically at this time which is great news and overall I am extremely pleased with where we stand today. 08-13-2022 upon evaluation today patient appears to actually be doing significantly better compared to last week. He actually did go to the hospital I told him that he needed to when he left here and he actually did go. With that being said they actually ended up admitting him he was having shortness of breath and I thought it might be related to congestive heart failure turns out he actually had a pulmonary embolism. Subsequently they were able to get him off of the Coumadin switching over to Eliquis to get things stabilized in that regard they also had them wrapped and got his swelling under control on his legs he actually looks much better pretty much across the board at this point. I am very pleased in that regard. With that being said I am very happy that he finally went that could have been a very dangerous situation. 08-20-2022 upon evaluation today patient appears to be doing well currently in regard to his wound. Has been tolerating the dressing changes without complication. Fortunately there does not appear to be any signs of active infection locally nor systemically at this time. I  think his legs are doing better there is some need for sharp debridement today. 08-27-2022 upon evaluation patient is actually making excellent progress. I am actually very pleased with where he stands and I think that he is moving in the right direction. In general I think that we are looking pretty good at the moment. 09-03-2022 upon evaluation today patient appears to be doing well currently in regard to his wound. He is actually tolerating dressing changes on the left and right leg without complication. Fortunately I do not see any need for debridement of the left leg the right leg I think we probably do some need to perform some debridement here. 09-10-2022 upon evaluation today patient's wounds actually showed signs of improvement in both legs I do not see much is going require debridement  today which was great news. Fortunately I do not see any evidence of infection which I think is also excellent he seems to be using his pumps and doing everything right I am happy about how this is progressing at this point. Objective Constitutional Well-nourished and well-hydrated in no acute distress. Vitals Time Taken: 1:35 PM, Height: 74 in, Weight: 250 lbs, BMI: 32.1, Temperature: 97.9 F, Pulse: 89 bpm, Respiratory Rate: 18 breaths/min, Blood Pressure: 162/63 mmHg. Respiratory normal breathing without difficulty. Psychiatric this patient is able to make decisions and demonstrates good insight into disease process. Alert and Oriented x 3. pleasant and cooperative. General Notes: Upon inspection patient's wound bed showed signs of good granulation and epithelization at this point. I do believe that he is making progress and I am very pleased in that regard I do not see any signs of overall worsening also this is great news. Integumentary (Hair, Skin) Wound #6 status is Open. Original cause of wound was Gradually Appeared. The date acquired was: 03/05/2022. The wound has been in treatment 24  weeks. The wound is located on the Right,Medial Lower Leg. The wound measures 4.2cm length x 4cm width x 0.1cm depth; 13.195cm^2 area and 1.319cm^3 volume. There is Fat Layer (Subcutaneous Tissue) exposed. There is a medium amount of serosanguineous drainage noted. The wound margin is distinct with the outline attached to the wound base. There is no granulation within the wound bed. There is a large (67-100%) amount of necrotic tissue within the wound bed including Adherent Slough. The periwound skin appearance exhibited: Scarring, Hemosiderin Staining. The periwound skin appearance did not exhibit: Callus, Crepitus, Excoriation, Induration, Rash, Dry/Scaly, Maceration, Atrophie Blanche, Cyanosis, Ecchymosis, Mottled, Pallor, Rubor, Erythema. Wound #7 status is Open. Original cause of wound was Gradually Appeared. The date acquired was: 03/05/2022. The wound has been in treatment 24 weeks. The wound is located on the Left,Medial Lower Leg. The wound measures 2.5cm length x 1.2cm width x 0.1cm depth; 2.356cm^2 area and 0.236cm^3 volume. There is Fat Layer (Subcutaneous Tissue) exposed. There is a medium amount of serosanguineous drainage noted. The wound margin is distinct with the outline attached to the wound base. There is large (67-100%) red, pink granulation within the wound bed. There is a small (1-33%) amount of necrotic tissue within the wound bed including Adherent Slough. The periwound skin appearance exhibited: Scarring, Maceration, Hemosiderin Staining. The periwound skin appearance did not exhibit: Callus, Crepitus, Excoriation, Induration, Rash, Dry/Scaly, Atrophie Blanche, Cyanosis, Ecchymosis, Mottled, Pallor, Rubor, Erythema. 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 There was a Selective/Open Wound Skin/Epidermis Debridement with a total area of 13.19 sq cm performed by [name], PA. With the following instrument(s): Curette to remove Viable and Non-Viable tissue/material. Material removed includes Slough, Skin: Dermis, and Skin: Epidermis after achieving pain control using Lidocaine 4% T opical Solution. A time out was conducted at 14:25, prior to the start of the procedure. There was no bleeding. The procedure was tolerated well with a pain level of 0 throughout and a pain level of 0 following the procedure. Post Debridement Measurements: 4.2cm length x 4cm width x  0.1cm depth; 1.319cm^3 volume. Character of Wound/Ulcer Post Debridement is improved. Post procedure Diagnosis Wound #6: Same as Pre-Procedure Wound #7 Pre-procedure diagnosis of Wound #7 is a Lymphedema located on the Left,Medial Lower Leg . There was a Selective/Open Wound Skin/Epidermis Debridement with a total area of 2.36 sq cm performed by [name], PA. With the following instrument(s): Curette to remove Viable and Non-Viable tissue/material. Material removed includes Slough, Skin: Dermis, and Skin: Epidermis after achieving pain control using Lidocaine 4% T opical Solution. A time out was conducted at 14:25, prior to the start of the procedure. There was no bleeding. The procedure was tolerated well with a pain level of 0 throughout and a pain level of 0 following the procedure. Post Debridement Measurements: 2.5cm length x 1.2cm width x 0.1cm depth; 0.236cm^3 volume. Character of Wound/Ulcer Post Debridement is improved. Post procedure Diagnosis Wound #7: Same as  Pre-Procedure Plan Follow-up Appointments: Return Appointment in 1 week. Leonard Schwartz Wednesday 09/17/2022 215pm room 8 Return Appointment in 2 weeks. Leonard Schwartz Wednesday 09/24/2022 215pm room 8 Other: - ***Change daily. ***** Anesthetic: (In clinic) Topical Lidocaine 5% applied to wound bed Bathing/ Shower/ Hygiene: May shower with protection but do not get wound dressing(s) wet. Protect dressing(s) with water repellant cover (for example, large plastic bag) or a cast cover and may then take shower. Edema Control - Lymphedema / SCD / Other: Lymphedema Pumps. Use Lymphedema pumps on leg(s) 2-3 times a day for 45-60 minutes. If wearing any wraps or hose, do not remove them. Continue exercising as instructed. - *****pump 3 times a day for an hour each time.***** Elevate legs to the level of the heart or above for 30 minutes daily and/or when sitting for 3-4 times a day throughout the day. - important Avoid standing for long periods of time. Exercise regularly Compression stocking or Garment 30-40 mm/Hg pressure to: - juxtafit Essentials Long for bilateral lower legs; quantity three juxtafit essentials per limb. WOUND #6: - Lower Leg Wound Laterality: Right, Medial Cleanser: Soap and Water 1 x Per Day/30 Days Discharge Instructions: May shower and wash wound with dial antibacterial soap and water prior to dressing change. Peri-Wound Care: Sween Lotion (Moisturizing lotion) 1 x Per Day/30 Days Discharge Instructions: Apply moisturizing lotion as directed Prim Dressing: Maxorb Extra Ag+ Alginate Dressing, 4x4.75 (in/in) 1 x Per Day/30 Days ary Discharge Instructions: Apply to wound bed as instructed Prim Dressing: Compounding topical antibiotics- [facility] 1 x Per Day/30 Days ary Discharge Instructions: apply under alignate Ag when patient brings with appts. Secondary Dressing: ABD Pad, 5x9 1 x Per Day/30 Days Discharge Instructions: Apply over primary dressing as directed. Secondary Dressing:  Woven Gauze Sponge, Non-Sterile 4x4 in 1 x Per Day/30 Days Discharge Instructions: Apply over primary dressing as directed. Secured With: [employer], 4.5x3.1 (in/yd) 1 x Per Day/30 Days Discharge Instructions: Secure with Kerlix as directed. Secured With: 55M Medipore H Soft Cloth Surgical T ape, 4 x 10 (in/yd) 1 x Per Day/30 Days Discharge Instructions: Secure with tape as directed. Com pression Wrap: Juxtafit 30-75mmHg 1 x Per Day/30 Days Discharge Instructions: apply in the morning and remove at night. WOUND #7: - Lower Leg Wound Laterality: Left, Medial Cleanser: Soap and Water 1 x Per Day/30 Days Discharge Instructions: May shower and wash wound with dial antibacterial soap and water prior to dressing change. Peri-Wound Care: Sween Lotion (Moisturizing lotion) 1 x Per Day/30 Days Discharge Instructions: Apply moisturizing lotion as directed Prim Dressing: Maxorb Extra Ag+ Alginate  Dressing, 4x4.75 (in/in) 1 x Per Day/30 Days ary Discharge Instructions: Apply to wound bed as instructed Prim Dressing: Compounding topical antibiotics- [facility] 1 x Per Day/30 Days ary Discharge Instructions: apply under alignate Ag when patient brings with appts. Secondary Dressing: ABD Pad, 5x9 1 x Per Day/30 Days Discharge Instructions: Apply over primary dressing as directed. Secondary Dressing: Woven Gauze Sponge, Non-Sterile 4x4 in 1 x Per Day/30 Days Discharge Instructions: Apply over primary dressing as directed. Secured With: [employer], 4.5x3.1 (in/yd) 1 x Per Day/30 Days Discharge Instructions: Secure with Kerlix as directed. Secured With: 74M Medipore H Soft Cloth Surgical T ape, 4 x 10 (in/yd) 1 x Per Day/30 Days Discharge Instructions: Secure with tape as directed. Com pression Wrap: Juxtafit 30-18mmHg 1 x Per Day/30 Days Discharge Instructions: apply in the morning and remove at night. 1. I would recommend that we have the patient continue to monitor for any  evidence of infection or worsening. Based on what I am seeing and I do believe that we are making good progress here. 2. I am going to suggest he continue with lymphedema pumps, elevating his legs, and subsequently using the juxta fit compression wraps which I feel like you are doing a good job. We will see patient back for reevaluation in 1 week here in the clinic. If anything worsens or changes patient will contact our office for additional recommendations. [name], [name] ([id number]) [id number] Page 11 of 11 Electronic Signature(s) Signed: 09/10/2022 4:55:33 PM By: [name] PA-C Entered By: [name] on 09/10/2022 16:55:33 -------------------------------------------------------------------------------- SuperBill Details Patient Name: Date of Service: Ardizzone, [name] 09/10/2022 Medical Record Number: [id number] Patient Account Number: [id number] Date of Birth/Sex: Treating RN: 11/02/1945 (77 y.o. [name], [name] Primary Care Provider: [name] Other Clinician: Referring Provider: Treating Provider/Extender: [name] Weeks in Treatment: 24 Diagnosis Coding ICD-10 Codes Code Description [phone] Chronic venous hypertension (idiopathic) with ulcer and inflammation of bilateral lower extremity I89.0 Lymphedema, not elsewhere classified 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 other part of right lower leg with other specified severity Facility Procedures : CPT4 Code: [id number] Description: 97597 - DEBRIDE WOUND 1ST 20 SQ CM OR < ICD-10 Diagnosis Description L97.828 Non-pressure chronic ulcer of other part of left lower leg with other specified se 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 other part of right lower leg with other  specified s Modifier: verity everity Quantity: 1 Physician Procedures : CPT4 Code Description Modifier  [id number] - WC PHYS DEBR WO ANESTH 20 SQ CM ICD-10 Diagnosis Description 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 other  part of right lower leg with other specified severity Quantity: 1 Electronic Signature(s) Signed: 09/10/2022 4:55:52 PM By: [name] PA-C Entered By: [name] on 09/10/2022 16:55:52

## 2022-09-15 NOTE — Progress Notes (Signed)
Slone, [name] ([id number]) [id number] Page 1 of 7 Visit Report for 09/10/2022 Arrival Information Details Patient Name: Date of Service: [name], [name] 09/10/2022 1:30 PM Medical Record Number: [id number] Patient Account Number: [id number] Date of Birth/Sex: Treating RN: Dec 21, 1945 (77 y.o. M) Primary Care Bernis Stecher: [name] Other Clinician: Referring Quientin Jent: Treating Jewelz Ricklefs/Extender: [name] in Treatment: 24 Visit Information History Since Last Visit Added or deleted any medications: No Patient Arrived: Ambulatory Any new allergies or adverse reactions: No Arrival Time: 13:35 Had a fall or experienced change in No Accompanied By: self activities of daily living that may affect Transfer Assistance: None risk of falls: Patient Identification Verified: Yes Signs or symptoms of abuse/neglect since last visito No Secondary Verification Process Completed: Yes Hospitalized since last visit: No Patient Requires Transmission-Based Precautions: No Implantable device outside of the clinic excluding No Patient Has Alerts: Yes cellular tissue based products placed in the center Patient Alerts: Patient on Blood Thinner since last visit: Right ABI in clinic [place] Has Dressing in Place as Prescribed: Yes Pain Present Now: No Electronic Signature(s) Signed: 09/10/2022 4:04:07 PM By: Thayer Dallas Entered By: Thayer Dallas on 09/10/2022 13:35:28 -------------------------------------------------------------------------------- Encounter Discharge Information Details Patient Name: Date of Service: [name], [name] 09/10/2022 1:30 PM Medical Record Number: [id number] Patient Account Number: [id number] Date of Birth/Sex: Treating RN: 06-Apr-1946 (77 y.o. [name] Primary Care Annalis Kaczmarczyk: [name] Other Clinician: Referring Malory Spurr: Treating Breeze Angell/Extender: [name] Weeks in Treatment:  24 Encounter Discharge Information Items Post Procedure Vitals Discharge Condition: Stable Temperature (F): 97.9 Ambulatory Status: Cane Pulse (bpm): 89 Discharge Destination: Home Respiratory Rate (breaths/min): 18 Transportation: Private Auto Blood Pressure (mmHg): 162/63 Accompanied By: self Schedule Follow-up Appointment: Yes Clinical Summary of Care: Electronic Signature(s) Signed: 09/11/2022 4:51:53 PM By: [name] RN, [name] Entered By: [name] on 09/10/2022 14:38:35 -------------------------------------------------------------------------------- Lower Extremity Assessment Details Patient Name: Date of Service: [name], [name] 09/10/2022 1:30 PM Medical Record Number: [id number] Patient Account Number: [id number] Date of Birth/Sex: Treating RN: 20-Dec-1945 (77 y.o. M) Primary Care Mertie Haslem: [name] Other Clinician: Referring Simrat Kendrick: Treating Branna Cortina/Extender: Leveda Anna, [name] Weeks in Treatment: 24 Edema Assessment Assessed: [Left: No] [Right: No] J[Left: [name], [name] ([id number])] [Right: [id number] Page 2 of 7] Edema: [Left: Yes] [Right: Yes] Calf Left: Right: Point of Measurement: 40 cm From Medial Instep 52 cm 62.2 cm Ankle Left: Right: Point of Measurement: 13 cm From Medial Instep 43 cm 42 cm Electronic Signature(s) Signed: 09/10/2022 4:04:07 PM By: Thayer Dallas Entered By: Thayer Dallas on 09/10/2022 13:41:14 -------------------------------------------------------------------------------- Multi-Disciplinary Care Plan Details Patient Name: Date of Service: [name], [name] 09/10/2022 1:30 PM Medical Record Number: [id number] Patient Account Number: [id number] Date of Birth/Sex: Treating RN: [birth year] (77 y.o. [name] Primary Care Sajjad Honea: [name] Other Clinician: Referring Johnsie Moscoso: Treating Zeenat Jeanbaptiste/Extender: [name] Weeks in Treatment: 24 Active  Inactive Wound/Skin Impairment Nursing Diagnoses: Impaired tissue integrity Knowledge deficit related to ulceration/compromised skin integrity Goals: Patient will have a decrease in wound volume by X% from date: (specify in notes) Date Initiated: 03/26/2022 Target Resolution Date: 09/19/2022 Goal Status: Active Patient/caregiver will verbalize understanding of skin care regimen Date Initiated: 03/26/2022 Target Resolution Date: 09/20/2022 Goal Status: Active Ulcer/skin breakdown will have a volume reduction of 30% by week 4 Date Initiated: 03/26/2022 Date Inactivated: 05/21/2022 Target Resolution Date: 05/17/2022 Unmet Reason: see wound Goal Status: Unmet measurement. Ulcer/skin breakdown will have a volume  reduction of 50% by week 8 Date Initiated: 03/26/2022 Date Inactivated: 05/21/2022 Target Resolution Date: 05/17/2022 Unmet Reason: see wound Goal Status: Unmet measurement. Interventions: Assess patient/caregiver ability to obtain necessary supplies Assess patient/caregiver ability to perform ulcer/skin care regimen upon admission and as needed Assess ulceration(s) every visit Notes: Patient stated today, "I will take my fluid pill or pump not do both." Redell Nazir made aware. Electronic Signature(s) Signed: 09/11/2022 4:51:53 PM By: [name] RN, [name] Entered By: [name] on 09/10/2022 14:02:13 -------------------------------------------------------------------------------- Pain Assessment Details Patient Name: Date of Service: [name], [name] 09/10/2022 1:30 PM Medical Record Number: [id number] Patient Account Number: [id number] Date of Birth/Sex: Treating RN: 03/19/1946 (77 y.o. M) Primary Care Mikel Pyon: [name] Other Clinician: Faas, [name] ([id number]) [id number] Page 3 of 7 Referring Kay Shippy: Treating Chieko Neises/Extender: Leveda Anna, [name] Weeks in Treatment: 24 Active Problems Location of Pain Severity and  Description of Pain Patient Has Paino No Site Locations Pain Management and Medication Current Pain Management: Electronic Signature(s) Signed: 09/10/2022 4:04:07 PM By: Thayer Dallas Entered By: Thayer Dallas on 09/10/2022 13:37:47 -------------------------------------------------------------------------------- Patient/Caregiver Education Details Patient Name: Date of Service: [name], [name] 4/24/2024andnbsp1:30 PM Medical Record Number: [id number] Patient Account Number: [id number] Date of Birth/Gender: Treating RN: 1945-07-15 (77 y.o. [name] Primary Care Physician: [name] Other Clinician: Referring Physician: Treating Physician/Extender: [name] in Treatment: 24 Education Assessment Education Provided To: Patient Education Topics Provided Wound/Skin Impairment: Handouts: Caring for Your Ulcer Methods: Explain/Verbal Responses: Reinforcements needed Electronic Signature(s) Signed: 09/11/2022 4:51:53 PM By: [name] RN, [name] Entered By: [name] on 09/10/2022 14:02:37 -------------------------------------------------------------------------------- Wound Assessment Details Patient Name: Date of Service: [name], [name] 09/10/2022 1:30 PM Medical Record Number: [id number] Patient Account Number: [id number] Date of Birth/Sex: Treating RN: 03/25/1946 (77 y.o. M) Primary Care Chia Rock: [name] Other Clinician: Referring Gerldine Suleiman: Treating Sintia Mckissic/Extender: [name] Spina, [name] ([id number]) [id number] Page 4 of 7 Weeks in Treatment: 24 Wound Status Wound Number: 6 Primary Lymphedema Etiology: Wound Location: Right, Medial Lower Leg Wound Open Wounding Event: Gradually Appeared Status: Date Acquired: 03/05/2022 Comorbid Anemia, Lymphedema, Deep Vein Thrombosis, Hypertension, Weeks Of Treatment: 24 History: Received Radiation Clustered Wound:  Yes Photos Wound Measurements Length: (cm) 4.2 Width: (cm) 4 Depth: (cm) 0.1 Clustered Quantity: 1 Area: (cm) 13.195 Volume: (cm) 1.319 % Reduction in Area: 90.8% % Reduction in Volume: 90.8% Epithelialization: Medium (34-66%) Wound Description Classification: Full Thickness Without Exposed Sup Wound Margin: Distinct, outline attached Exudate Amount: Medium Exudate Type: Serosanguineous Exudate Color: red, brown port Structures Foul Odor After Cleansing: No Slough/Fibrino Yes Wound Bed Granulation Amount: None Present (0%) Exposed Structure Necrotic Amount: Large (67-100%) Fascia Exposed: No Necrotic Quality: Adherent Slough Fat Layer (Subcutaneous Tissue) Exposed: Yes Tendon Exposed: No Muscle Exposed: No Joint Exposed: No Bone Exposed: No Periwound Skin Texture Texture Color No Abnormalities Noted: No No Abnormalities Noted: No Callus: No Atrophie Blanche: No Crepitus: No Cyanosis: No Excoriation: No Ecchymosis: No Induration: No Erythema: No Rash: No Hemosiderin Staining: Yes Scarring: Yes Mottled: No Pallor: No Moisture Rubor: No No Abnormalities Noted: No Dry / Scaly: No Maceration: No Treatment Notes Wound #6 (Lower Leg) Wound Laterality: Right, Medial Cleanser Soap and Water Discharge Instruction: May shower and wash wound with dial antibacterial soap and water prior to dressing change. Peri-Wound Care Sween Lotion (Moisturizing lotion) Discharge Instruction: Apply moisturizing lotion as directed Glotfelty, [name] ([id number]) [id number] Page 5 of 7 Topical Primary Dressing Maxorb Extra  Ag+ Alginate Dressing, 4x4.75 (in/in) Discharge Instruction: Apply to wound bed as instructed Compounding topical antibiotics- [facility] Discharge Instruction: apply under alignate Ag when patient brings with appts. Secondary Dressing ABD Pad, 5x9 Discharge Instruction: Apply over primary dressing as directed. Woven Gauze  Sponge, Non-Sterile 4x4 in Discharge Instruction: Apply over primary dressing as directed. Secured With [employer], 4.5x3.1 (in/yd) Discharge Instruction: Secure with Kerlix as directed. 92M Medipore H Soft Cloth Surgical T ape, 4 x 10 (in/yd) Discharge Instruction: Secure with tape as directed. Compression Wrap Juxtafit 30-74mmHg Discharge Instruction: apply in the morning and remove at night. Compression Stockings Add-Ons Electronic Signature(s) Signed: 09/15/2022 10:05:50 AM By: Karl Ito Entered By: Karl Ito on 09/10/2022 13:36:27 -------------------------------------------------------------------------------- Wound Assessment Details Patient Name: Date of Service: [name], [name] 09/10/2022 1:30 PM Medical Record Number: [id number] Patient Account Number: [id number] Date of Birth/Sex: Treating RN: Jul 09, 1945 (77 y.o. M) Primary Care Billijo Dilling: [name] Other Clinician: Referring Taylan Mayhan: Treating Senta Kantor/Extender: Leveda Anna, [name] Weeks in Treatment: 24 Wound Status Wound Number: 7 Primary Lymphedema Etiology: Wound Location: Left, Medial Lower Leg Wound Open Wounding Event: Gradually Appeared Status: Date Acquired: 03/05/2022 Comorbid Anemia, Lymphedema, Deep Vein Thrombosis, Hypertension, Weeks Of Treatment: 24 History: Received Radiation Clustered Wound: Yes Photos Wound Measurements Length: (cm) 2.5 Width: (cm) 1.2 Depth: (cm) 0.1 Clustered Quantity: 1 Area: (cm) 2.356 [name], [name] ([id number]) Volume: (cm) 0.236 % Reduction in Area: 95% % Reduction in Volume: 95% Epithelialization: Large (67-100%) [id number] Page 6 of 7 Wound Description Classification: Full Thickness Without Exposed Suppor Wound Margin: Distinct, outline attached Exudate Amount: Medium Exudate Type: Serosanguineous Exudate Color: red, brown t Structures Foul Odor After Cleansing: No Slough/Fibrino  Yes Wound Bed Granulation Amount: Large (67-100%) Exposed Structure Granulation Quality: Red, Pink Fascia Exposed: No Necrotic Amount: Small (1-33%) Fat Layer (Subcutaneous Tissue) Exposed: Yes Necrotic Quality: Adherent Slough Tendon Exposed: No Muscle Exposed: No Joint Exposed: No Bone Exposed: No Periwound Skin Texture Texture Color No Abnormalities Noted: No No Abnormalities Noted: No Callus: No Atrophie Blanche: No Crepitus: No Cyanosis: No Excoriation: No Ecchymosis: No Induration: No Erythema: No Rash: No Hemosiderin Staining: Yes Scarring: Yes Mottled: No Pallor: No Moisture Rubor: No No Abnormalities Noted: No Dry / Scaly: No Maceration: Yes Treatment Notes Wound #7 (Lower Leg) Wound Laterality: Left, Medial Cleanser Soap and Water Discharge Instruction: May shower and wash wound with dial antibacterial soap and water prior to dressing change. Peri-Wound Care Sween Lotion (Moisturizing lotion) Discharge Instruction: Apply moisturizing lotion as directed Topical Primary Dressing Maxorb Extra Ag+ Alginate Dressing, 4x4.75 (in/in) Discharge Instruction: Apply to wound bed as instructed Compounding topical antibiotics- [facility] Discharge Instruction: apply under alignate Ag when patient brings with appts. Secondary Dressing ABD Pad, 5x9 Discharge Instruction: Apply over primary dressing as directed. Woven Gauze Sponge, Non-Sterile 4x4 in Discharge Instruction: Apply over primary dressing as directed. Secured With [employer], 4.5x3.1 (in/yd) Discharge Instruction: Secure with Kerlix as directed. 92M Medipore H Soft Cloth Surgical T ape, 4 x 10 (in/yd) Discharge Instruction: Secure with tape as directed. Compression Wrap Juxtafit 30-64mmHg Discharge Instruction: apply in the morning and remove at night. Compression Stockings Add-Ons Electronic Signature(s) [name], [name] ([id number]) [id number] Page 7 of  7 Signed: 09/15/2022 10:05:50 AM By: Karl Ito Entered By: Karl Ito on 09/10/2022 13:37:26 -------------------------------------------------------------------------------- Vitals Details Patient Name: Date of Service: Kahrs, [name] 09/10/2022 1:30 PM Medical Record Number: [id number] Patient Account Number: [id number] Date of Birth/Sex: Treating RN: Aug 14, 1945 (77 y.o. M) Primary  Care Robecca Fulgham: [name] Other Clinician: Referring Lakina Mcintire: Treating Galit Urich/Extender: [name] Weeks in Treatment: 24 Vital Signs Time Taken: 13:35 Temperature (F): 97.9 Height (in): 74 Pulse (bpm): 89 Weight (lbs): 250 Respiratory Rate (breaths/min): 18 Body Mass Index (BMI): 32.1 Blood Pressure (mmHg): 162/63 Reference Range: 80 - 120 mg / dl Electronic Signature(s) Signed: 09/10/2022 4:04:07 PM By: Thayer Dallas Entered By: Thayer Dallas on 09/10/2022 13:43:31

## 2022-09-17 ENCOUNTER — Encounter (HOSPITAL_BASED_OUTPATIENT_CLINIC_OR_DEPARTMENT_OTHER): Payer: Medicare Other | Attending: Physician Assistant | Admitting: Physician Assistant

## 2022-09-17 DIAGNOSIS — I5032 Chronic diastolic (congestive) heart failure: Secondary | ICD-10-CM | POA: Diagnosis not present

## 2022-09-17 DIAGNOSIS — L97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 other part of right lower leg with other specified severity: Secondary | ICD-10-CM | POA: Insufficient documentation

## 2022-09-17 DIAGNOSIS — I87333 Chronic venous hypertension (idiopathic) with ulcer and inflammation of bilateral lower extremity: Secondary | ICD-10-CM | POA: Diagnosis not present

## 2022-09-17 DIAGNOSIS — I130 Hypertensive heart and chronic kidney disease with heart failure and stage 1 through stage 4 chronic kidney disease, or unspecified chronic kidney disease: Secondary | ICD-10-CM | POA: Diagnosis not present

## 2022-09-17 DIAGNOSIS — I890 Lymphedema, not elsewhere classified: Secondary | ICD-10-CM | POA: Diagnosis present

## 2022-09-17 DIAGNOSIS — L84 Corns and callosities: Secondary | ICD-10-CM | POA: Insufficient documentation

## 2022-09-17 DIAGNOSIS — L97828 Non-pressure chronic ulcer of other part of left lower leg with other specified severity: Secondary | ICD-10-CM | POA: Diagnosis not present

## 2022-09-18 NOTE — Progress Notes (Addendum)
[name] [name] ([id number]) [id number] Page 1 of 11 Visit Report for 09/17/2022 Chief Complaint Document Details Patient Name: Date of Service: [name] [name] 09/17/2022 2:15 PM Medical Record Number: [id number] Patient Account Number: [id number] Date of Birth/Sex: Treating RN: Oct 26, 1945 (77 y.o. M) Primary Care Provider: [name] Other Clinician: Referring Provider: Treating Provider/Extender: [name] Weeks in Treatment: 25 Information Obtained from: Patient Chief Complaint 02/23/2020; patient is here for wounds on his bilateral lower legs in the setting of severe lymphedema 03/26/2022; patient is here for wounds on his bilateral lower legs medial aspect Electronic Signature(s) Signed: 09/17/2022 1:42:16 PM By: [name] PA-C Entered By: [name] on 09/17/2022 13:42:16 -------------------------------------------------------------------------------- Debridement Details Patient Name: Date of Service: [name] [name] [name] 09/17/2022 2:15 PM Medical Record Number: [id number] Patient Account Number: [id number] Date of Birth/Sex: Treating RN: [birth year] (77 y.o. [name] [name] [name] [name] Primary Care Provider: [name] Other Clinician: Referring Provider: Treating Provider/Extender: [name] Weeks in Treatment: 25 Debridement Performed for Assessment: Wound #6 Right,Medial Lower Leg Performed By: Physician [name], PA Debridement Type: Debridement Level of Consciousness (Pre-procedure): Awake and Alert Pre-procedure Verification/Time Out Yes - 15:00 Taken: Start Time: 15:01 Pain Control: Lidocaine 4% T opical Solution Percent of Wound Bed Debrided: 100% T Area Debrided (cm): otal 14.13 Tissue and other material debrided: Viable, Non-Viable, Slough, Subcutaneous, Skin: Dermis , Skin: Epidermis, Biofilm, Slough Level: Skin/Subcutaneous Tissue Debridement Description: Excisional Instrument:  Curette Bleeding: Minimum Hemostasis Achieved: Pressure End Time: 15:05 Procedural Pain: 0 Post Procedural Pain: 0 Response to Treatment: Procedure was tolerated well Level of Consciousness (Post- Awake and Alert procedure): Post Debridement Measurements of Total Wound Length: (cm) 6 Width: (cm) 3 Depth: (cm) 0.2 Volume: (cm) 2.827 Character of Wound/Ulcer Post Debridement: Improved Post Procedure Diagnosis Same as Pre-procedure Electronic Signature(s) Signed: 09/17/2022 4:44:49 PM By: [name] RN, [name] Signed: 09/17/2022 5:01:35 PM By: [name] PA-C Entered By: [name] on 09/17/2022 15:04:35 [name] [name] ([id number]) [id number] Page 2 of 11 -------------------------------------------------------------------------------- Debridement Details Patient Name: Date of Service: [name] [name] [name] 09/17/2022 2:15 PM Medical Record Number: [id number] Patient Account Number: [id number] Date of Birth/Sex: Treating RN: [birth year] (77 y.o. [name] [name] [name] [name] Primary Care Provider: [name] Other Clinician: Referring Provider: Treating Provider/Extender: [name] Weeks in Treatment: 25 Debridement Performed for Assessment: Wound #7 Left,Medial Lower Leg Performed By: Physician [name], PA Debridement Type: Debridement Level of Consciousness (Pre-procedure): Awake and Alert Pre-procedure Verification/Time Out Yes - 15:00 Taken: Start Time: 15:01 Pain Control: Lidocaine 4% T opical Solution Percent of Wound Bed Debrided: 100% T Area Debrided (cm): otal 2.45 Tissue and other material debrided: Viable, Non-Viable, Slough, Subcutaneous, Skin: Dermis , Skin: Epidermis, Biofilm, Slough Level: Skin/Subcutaneous Tissue Debridement Description: Excisional Instrument: Curette Bleeding: Minimum Hemostasis Achieved: Pressure End Time: 15:05 Procedural Pain: 0 Post Procedural Pain: 0 Response to Treatment: Procedure  was tolerated well Level of Consciousness (Post- Awake and Alert procedure): Post Debridement Measurements of Total Wound Length: (cm) 2.4 Width: (cm) 1.3 Depth: (cm) 0.2 Volume: (cm) 0.49 Character of Wound/Ulcer Post Debridement: Improved Post Procedure Diagnosis Same as Pre-procedure Electronic Signature(s) Signed: 09/17/2022 4:44:49 PM By: [name] RN, [name] Signed: 09/17/2022 5:01:35 PM By: [name] PA-C Entered By: [name] on 09/17/2022 15:04:56 -------------------------------------------------------------------------------- HPI Details Patient Name: Date of Service: [name] [name] [name] 09/17/2022 2:15 PM Medical Record Number: [id number] Patient Account Number: [id number] Date of Birth/Sex: Treating RN: 1945-05-28 (77 y.o. M)  Primary Care Provider: [name] Other Clinician: Referring Provider: Treating Provider/Extender: [name] Weeks in Treatment: 25 History of Present Illness HPI Description: ADMISSION 02/23/2020 Patient is a 77 year old man who lives in [place] who arrives accompanied by his wife. He has a history of chronic lymphedema and venous insufficiency in his bilateral lower legs which may have something to do that with having a history of DVT as well as being treated for prostate cancer. In any case he recently got compression pumps at home but compliance has been an issue here. He has compression stockings however they are probably not sufficient enough to control swelling. They tell us that things deteriorated for him in late August he was admitted to [facility] for 7 days. This was with cellulitis I think of his bilateral lower legs. Discharge he was noted to have wounds on his bilateral lower legs. He was discharged on Bactrim. They tried to get him home health through [facility] of course they declined him. His wife is been wrapping these applying some form of silver foam dressing. He has a history  of wounds before although nothing that would not heal with basic home topical dressings. He has 2 areas on the left medial, left anterior and left lateral and a smaller area [name] [name] ([id number]) [phone].pdf Page 3 of 11 on the right medial. All of these have considerable depth. Past medical history includes iron deficiency anemia, lymphedema followed by the rehab center at [facility] with lymphedema wraps I believe, DVT on chronic anticoagulation, prostate cancer, chronic venous insufficiency, hypertension. As mentioned he has compression pumps but does not use them. ABIs in our clinic were noncompressible bilaterally 10/14; patient with severe bilateral lymphedema right greater than left. He came in with bilateral lower extremity wounds left greater than right. Even though the right side has more of the edema most of the wounds here almost closed on the right medial. He has 3 remaining wounds on the left We have been using silver alginate under 4-layer compression I have been trying to get him to be compliant with his external compression pumps 10/21; patient with 3 small wounds on the left leg and 1 on the right medial in the setting of severe lymphedema and chronic venous insufficiency. We have been using silver alginate under 4-layer compression he is using his external compression pumps twice a day 11/4; ARTERIAL STUDIES on the right show an ABI of 1.02 TBI of 0.858 with biphasic waveforms on the left 0.98 with a TBI of 0.55 and biphasic waveforms. Does not look like he has significant arterial disease. We are treating him for lymphedema he has compression pumps. He has punched-out areas on the left anterior left lateral and right medial lower extremities 11/11; after we obtained his arterial studies I put him in 4 layer compression. He is using his compression pumps probably once a day although I have asked him to do twice. Primary dressing to the wound is  silver collagen he has severe lymphedema likely secondary to chronic venous insufficiency. Wounds on the left lateral, left medial and left anterior and a small area on the right medial 12/2; the area on the right anterior lower leg has healed. We initially thought that the area medially had healed as well however when her discharge nurse came in she detected fluid in the wound simply opened up. This is actually worse than I remember this pain. The area on the left lateral  potentially slightly smaller He is also complaining about pain in his left hand he says that this is actually been getting some better he has been using topical creams on this. She asked that I look at this 12/9 after last weeks issues we have 2 wounds one on the right medial lower leg and 1 on the left lateral. Both of these are in the same condition. I think because of thickened skin secondary to chronic lymphedema these wounds actually have depth of almost 0.8 cm. 12/16; the patient has 2 small but deep wounds one on the right medial and one on the left lateral. The right medial is actually the worst of these. He arrives in clinic today with absolutely terrible edema in the right leg apparently his 4-layer wrap fell down to just above his ankle he did not think about this he is apparently been continuing to use his compression pump twice a day. The left leg looks a lot better. 05/09/2020 upon evaluation today patient appears to be doing decently well in regard to his wounds. Everything is measuring smaller the right leg still has a little bit deeper wound in the left seems to be almost completely healed in my opinion I am very pleased in general with how things are progressing. He has a 4- layer compression wrap we have been using endoform today we will probably have to use collagen just based on the fact that we do not have endoform it is on order. 1/6; the patient's wound on the left lateral lower leg has healed. Still has 1 on  the right medial. 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 Using compression pumps at home twice a day. 1/13; left lateral lower leg is still healed. He has a deep punched out rectangular shaped wound on the right medial calf. Looking down at this it appears that he is attempting to epithelialize around the edges of the wound and on the base as well. His edema is reasonably well controlled we have been using collagen with absolutely no effect 1/20; left lateral lower leg remains closed he has extremitease stockings. The area on the right medial calf I aggressively debrided last week measures larger but the surface looks better. We have been using Hydrofera Blue. We ran Oasis through his insurance but we have not seen the results of this 1/27; left lower leg wound with chronic venous insufficiency and secondary lymphedema. I did aggressive debridement on this last week the wound seems to have come in healthy looking surface using Hydrofera Blue. He was denied for Oasis 2/3; small divot in the right medial lower leg. Under illumination the walls of this divot are epithelialized however the base has slough which I removed with a curette we have been using Hydrofera Blue 2/10 small divot on the right medial lower leg pinpoint illumination at the base of this cone-shaped wound. We have been using Hydrofera Blue but I will switch to calcium alginate this week 2/17; the small divot on the right medial lower leg is fully epithelialized. There is no visible open area under illumination. He has his own stocking for the right leg similar to the one he has been wearing on the left. 03/26/2022; READMISSION This is a now 77 year old man that we had in the clinic from 02/23/2020 through 07/05/2020. At that point he had bilateral lower extremity wounds left greater than right in the setting of severe lymphedema. He had already obtained compression pumps ordered for him I think from the  wound care  clinic in [facility] so I do not really have record of what he has been using. He claims to be using them once a day but there is a problem with the sleeve on the left leg. About 2 weeks ago he was hospitalized from 03/11/2022 through 03/14/2022 with diastolic congestive heart failure. 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 He was diuresed. Developed some prerenal azotemia and he has not been taking any diuretics currently. He has not been putting stockings on his legs since he got out of hospital and still has his legs dependent for long periods. Past medical history history of prostate cancer treated with prostatectomy and radiation this was apparently about 8 years ago, history of DVT on chronic Coumadin, history of lymphedema was managed for a while at the clinic in [place]. History of inguinal hernia repair in September 22, hypertension, stage IIIb chronic renal failure ABIs today were noncompressible on the right 1.12 on the left 04-02-2022 upon evaluation today patient appears to be doing well currently in regard to his legs I do feel like both areas that are draining are actually much drier than they were in the picture last week although the left is drier than the right. He is tolerating the 4-layer compression wraps at this point he did contact the pump company and they are actually working on getting him a new compression sleeve for one of his legs which have previously popped and was not functioning properly. 04-23-2022 upon evaluation today patient appears to be doing well currently in regard to his wounds on the legs. I am actually very pleased with where things stand and I do feel like that we are headed in the right direction. Fortunately there is no sign of active infection locally or systemically at this time. 05-07-2022 upon evaluation today patient appears to be doing well currently in regard to his wounds in fact things  are showing signs of improvement which is good news I do not see too much that actually appears to be open and I am very pleased in that regard. No fevers, chills, nausea, vomiting, or diarrhea. 05-21-2022 upon evaluation today patient appears to be doing somewhat poorly in regard to drainage of his lower extremities bilaterally. The right is greater than left as far as the weeping area. Nonetheless it seems to be getting worse not better. He actually has pitting edema which is at least 2+ to the thighs and I am concerned about the fact that he is may be fluid overloaded in general and that is the reason why we cannot get this under control. I know he is not using his pumps all the time because he actually told the nurse that he was either going to pump or he was going to use his fluid pills but not do both. For that reason I do think that he needs to be really doing both in order to get the fluid out as effectively as possible obviously with the 4-layer compression wraps were doing as much as we can from a compression standpoint but it is really not enough. He tells me that he elevates his leg is much as he can in between pumping and other activity throughout the day. 05-28-2022 upon evaluation today patient appears to be doing better in regard to his wounds although the measurements may be a little bit larger this is a very difficult wound to heal it is very indistinct in  a lot of areas. Nonetheless there is can be some need for sharp debridement in regard to both medial and lateral [name] [name] [name] [name] ([id number]) [phone].pdf Page 4 of 11 legs. Fortunately I see no signs of active infection locally nor systemically at this time. No fevers, chills, nausea, vomiting, or diarrhea. 06-04-2022 upon evaluation today patient appears to be doing poorly in general in regard to the wounds on his legs. He still continues to have a tremendous amount of fluid not just in the lower portion of  his leg but to be honest his thigh where he has 2-3+ pitting edema in the thigh as well. Unfortunately I do not know that we will be able to get this healed effectively and keep it healed on the lower extremities unless he gets the overall fluid situation taking and under control. Fortunately I do not see any signs of infection locally nor systemically which is great news. He just seems to be very fluid overloaded. 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 He has been tolerating the dressing changes with the compression wraps without complication. Fortunately I do not see any evidence of infection at this time which is great news. No fevers, chills, nausea, vomiting, or diarrhea. 06-18-2022 upon evaluation today patient appears to be doing well currently in regard to his wounds as far as not looking like they are terribly infected but nonetheless I am concerned about a subacute infection secondary to the fact that he continues to have spreading despite the compression therapy. We actually did do an Unna boot on him last week this is actually the first wrap that actually stayed up everything else has been sliding down quite significantly. Fortunately there does not appear to be any signs of infection systemically at this time. With that being said I do believe that locally there seems to be an issue going on here and again I Ernie Hew do a PCR culture to see what that shows also think that I am going to put him on a broad-spectrum antibiotic, doxycycline to see how that will help as well. He does tell me that coming into the clinic today that he was feeling short of breath like "he was about to have a heart attack" because he was having such a hard time breathing. He says that he told this to Dr. Jodelle Green his cardiologist as well when he was evaluated in the past 1 to 2 weeks. 07-02-2022 upon  evaluation today patient appears to be doing poorly currently in regard to his wound. He has been tolerating the dressing changes. Unfortunately he has not had any compression wraps on for the past week because he was unable to make it in for his appointment last week. With that being said he has a significant amount of drainage he tells me has been using his pumps but despite this in the pumps he still has been draining quite a bit. The drainage is also somewhat purulent unfortunately. We did attempt to get in touch with his cardiologist last week unfortunately we were unable to get up with him I did advise that the patient needs to get in touch with him upon leaving today in order to make sure they know he is on the new antibiotics I am going to send him this will be Levaquin and Augmentin. 07-09-2022 upon evaluation today patient appears to be doing about the same in regard to his legs he may have just a  slight amount of improvement with regard to the drainage probably Keystone topical antibiotics are helping in this regard to some degree. Fortunately there does not appear to be any signs of active infection systemically which is great news. No fevers, chills, nausea, vomiting, or diarrhea. 07-16-2022 upon evaluation today patient appears to be doing well currently in regard to his wound. He has been tolerating the dressing changes without complication. Fortunately there does not appear to be any signs of active infection locally nor systemically at this time. With that being said he cannot keep the wraps up he tells me on the left side he had to cut this down because it got too tight. He has been using his pumps but he is on the right side the wrap actually straight down causing some pushing around the central part of his leg just below the calf I think this is a bigger risk for him that help at this point. I think that we may need to try something different he should be getting his compression socks  shortly he tells me they were ordered last Thursday. 3/6; ; this is a patient who lives in [place]. He has severe bilateral lymphedema. He has compression pumps, we have been using kerlix Ace wrap Keystone. He is changing the dressing. We do not have home health. 08-06-2022 upon evaluation today patient appears to be doing a little better in regard to his wounds in general at this point. Fortunately there does not appear to be any signs of active infection locally nor systemically at this time which is great news and overall I am extremely pleased with where we stand today. 08-13-2022 upon evaluation today patient appears to actually be doing significantly better compared to last week. He actually did go to the hospital I told him that he needed to when he left here and he actually did go. With that being said they actually ended up admitting him he was having shortness of breath and I thought it might be related to congestive heart failure turns out he actually had a pulmonary embolism. Subsequently they were able to get him off of the Coumadin switching over to Eliquis to get things stabilized in that regard they also had them wrapped and got his swelling under control on his legs he actually looks much better pretty much across the board at this point. I am very pleased in that regard. With that being said I am very happy that he finally went that could have been a very dangerous situation. 08-20-2022 upon evaluation today patient appears to be doing well currently in regard to his wound. Has been tolerating the dressing changes without complication. Fortunately there does not appear to be any signs of active infection locally nor systemically at this time. I think his legs are doing better there is some need for sharp debridement today. 08-27-2022 upon evaluation patient is actually making excellent progress. I am actually very pleased with where he stands and I think that he is moving in the right  direction. In general I think that we are looking pretty good at the moment. 09-03-2022 upon evaluation today patient appears to be doing well currently in regard to his wound. He is actually tolerating dressing changes on the left and right leg without complication. Fortunately I do not see any need for debridement of the left leg the right leg I think we probably do some need to perform some debridement here. 09-10-2022 upon evaluation today patient's wounds actually showed signs of  improvement in both legs I do not see much is going require debridement today which was great news. Fortunately I do not see any evidence of infection which I think is also excellent he seems to be using his pumps and doing everything right I am happy about how this is progressing at this point. 09-17-2022 upon evaluation today patient appears to be doing decently well in regard to his wounds. He has been tolerating the dressing changes without complication. Fortunately there does not appear to be any signs of active infection at this time which is good news. Electronic Signature(s) Signed: 09/17/2022 3:06:03 PM By: [name] PA-C Entered By: [name] on 09/17/2022 15:06:02 -------------------------------------------------------------------------------- Physical Exam Details Patient Name: Date of Service: Edman, [name] [name] 09/17/2022 2:15 PM Medical Record Number: [id number] Patient Account Number: [id number] Date of Birth/Sex: Treating RN: 04-Aug-1945 (77 y.o. M) Primary Care Provider: [name] Other Clinician: Referring Provider: Treating Provider/Extender: [name] Weeks in Treatment: 25 Constitutional Well-nourished and well-hydrated in no acute distress. Knieriem, [name] [name] ([id number]) [id number] Page 5 of 11 Respiratory normal breathing without difficulty. Psychiatric this patient is able to make decisions and demonstrates good insight into disease  process. Alert and Oriented x 3. pleasant and cooperative. Notes Upon inspection patient's wounds did require sharp debridement clearway some necrotic debris he tolerated this debridement today without complication no pain and postdebridement this appears to be doing much better. Electronic Signature(s) Signed: 09/17/2022 3:18:36 PM By: [name] PA-C Entered By: [name] on 09/17/2022 15:18:36 -------------------------------------------------------------------------------- Physician Orders Details Patient Name: Date of Service: Bar, [name] [name] 09/17/2022 2:15 PM Medical Record Number: [id number] Patient Account Number: [id number] Date of Birth/Sex: Treating RN: April 16, 1946 (77 y.o. [name] [name] [name] [name] Primary Care Provider: [name] Other Clinician: Referring Provider: Treating Provider/Extender: [name] in Treatment: 25 Verbal / Phone Orders: No Diagnosis Coding ICD-10 Coding Code Description [phone] Chronic venous hypertension (idiopathic) with ulcer and inflammation of bilateral lower extremity I89.0 Lymphedema, not elsewhere classified 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 other part of right lower leg with other specified severity Follow-up Appointments ppointment in 1 week. Leonard Schwartz Wednesday 09/24/2022 215pm room 8 Return A ppointment in 2 weeks. Leonard Schwartz Wednesday [id number] 215pm room 8 Return A Other: - ***Change daily. ***** Anesthetic (In clinic) Topical Lidocaine 5% applied to wound bed Bathing/ Shower/ Hygiene May shower with protection but do not get wound dressing(s) wet. Protect dressing(s) with water repellant cover (for example, large plastic bag) or a cast cover and may then take shower. Edema Control - Lymphedema / SCD / Other Lymphedema Pumps. Use Lymphedema pumps on leg(s) 2-3 times a day for 45-60 minutes. If wearing any wraps or  hose, do not remove them. Continue exercising as instructed. - *****pump 3 times a day for an hour each time.***** Elevate legs to the level of the heart or above for 30 minutes daily and/or when sitting for 3-4 times a day throughout the day. - important Avoid standing for long periods of time. Exercise regularly Compression stocking or Garment 30-40 mm/Hg pressure to: - juxtafit Essentials Long for bilateral lower legs; quantity three juxtafit essentials per limb. Wound Treatment Wound #6 - Lower Leg Wound Laterality: Right, Medial Cleanser: Soap and Water 1 x Per Day/30 Days Discharge Instructions: May shower and wash wound with dial antibacterial soap and water prior to dressing change. Peri-Wound Care:  Sween Lotion (Moisturizing lotion) 1 x Per Day/30 Days Discharge Instructions: Apply moisturizing lotion as directed Prim Dressing: Maxorb Extra Ag+ Alginate Dressing, 4x4.75 (in/in) 1 x Per Day/30 Days ary Discharge Instructions: Apply to wound bed as instructed Prim Dressing: Compounding topical antibiotics- [facility] 1 x Per Day/30 Days ary Discharge Instructions: apply under alignate Ag when patient brings with appts. Secondary Dressing: ABD Pad, 5x9 1 x Per Day/30 Days Discharge Instructions: Apply over primary dressing as directed. Secondary Dressing: Woven Gauze Sponge, Non-Sterile 4x4 in 1 x Per Day/30 Days Dormer, [name] [name] ([id number]) [id number] Page 6 of 11 Discharge Instructions: Apply over primary dressing as directed. Secured With: [employer], 4.5x3.1 (in/yd) 1 x Per Day/30 Days Discharge Instructions: Secure with Kerlix as directed. Secured With: 12M Medipore H Soft Cloth Surgical T ape, 4 x 10 (in/yd) 1 x Per Day/30 Days Discharge Instructions: Secure with tape as directed. Compression Wrap: Juxtafit 30-43mmHg 1 x Per Day/30 Days Discharge Instructions: apply in the morning and remove at night. Wound #7 - Lower Leg Wound  Laterality: Left, Medial Cleanser: Soap and Water 1 x Per Day/30 Days Discharge Instructions: May shower and wash wound with dial antibacterial soap and water prior to dressing change. Peri-Wound Care: Sween Lotion (Moisturizing lotion) 1 x Per Day/30 Days Discharge Instructions: Apply moisturizing lotion as directed Prim Dressing: Maxorb Extra Ag+ Alginate Dressing, 4x4.75 (in/in) 1 x Per Day/30 Days ary Discharge Instructions: Apply to wound bed as instructed Prim Dressing: Compounding topical antibiotics- [facility] 1 x Per Day/30 Days ary Discharge Instructions: apply under alignate Ag when patient brings with appts. Secondary Dressing: ABD Pad, 5x9 1 x Per Day/30 Days Discharge Instructions: Apply over primary dressing as directed. Secondary Dressing: Woven Gauze Sponge, Non-Sterile 4x4 in 1 x Per Day/30 Days Discharge Instructions: Apply over primary dressing as directed. Secured With: [employer], 4.5x3.1 (in/yd) 1 x Per Day/30 Days Discharge Instructions: Secure with Kerlix as directed. Secured With: 12M Medipore H Soft Cloth Surgical T ape, 4 x 10 (in/yd) 1 x Per Day/30 Days Discharge Instructions: Secure with tape as directed. Compression Wrap: Juxtafit 30-55mmHg 1 x Per Day/30 Days Discharge Instructions: apply in the morning and remove at night. Electronic Signature(s) Signed: 09/17/2022 4:44:49 PM By: [name] RN, [name] Signed: 09/17/2022 5:01:35 PM By: [name] PA-C Entered By: [name] on 09/17/2022 15:02:51 -------------------------------------------------------------------------------- Problem List Details Patient Name: Date of Service: Pinder, [name] [name] 09/17/2022 2:15 PM Medical Record Number: [id number] Patient Account Number: [id number] Date of Birth/Sex: Treating RN: 02/18/1946 (77 y.o. M) Primary Care Provider: [name] Other Clinician: Referring Provider: Treating Provider/Extender: [name] in Treatment:  25 Active Problems ICD-10 Encounter Code Description Active Date MDM Diagnosis I87.333 Chronic venous hypertension (idiopathic) with ulcer and inflammation of 06/11/2022 No Yes bilateral lower extremity I89.0 Lymphedema, not elsewhere classified 03/26/2022 No Yes L97.828 Non-pressure chronic ulcer of other part of left lower leg with other specified 03/26/2022 No Yes severity [name] [name] ([id number]) [id number] Page 7 of 11 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 other part of right lower leg with other specified 03/26/2022 No Yes severity Inactive Problems Resolved Problems Electronic Signature(s) Signed: 09/17/2022 1:41:57 PM By: [name] PA-C Entered By: [name] on 09/17/2022 13:41:57 -------------------------------------------------------------------------------- Progress Note Details Patient Name: Date of Service: [name] [name] [name] [name] 09/17/2022 2:15 PM Medical Record Number: [id number] Patient Account Number: [id number] Date of Birth/Sex: Treating RN: January 04, 1946 (77 y.o. M) Primary Care Provider: [name] Other Clinician: Referring Provider:  Treating Provider/Extender: Leveda Anna, [name] Weeks in Treatment: 25 Subjective Chief Complaint Information obtained from Patient 02/23/2020; patient is here for wounds on his bilateral lower legs in the setting of severe lymphedema 03/26/2022; patient is here for wounds on his bilateral lower legs medial aspect History of Present Illness (HPI) ADMISSION 02/23/2020 Patient is a 77 year old man who lives in [place] who arrives accompanied by his wife. He has a history of chronic lymphedema and venous insufficiency in his bilateral lower legs which may have something to do that with having a history of DVT as well as being treated for prostate cancer. In any case he recently got compression pumps at home but compliance has been an issue here. He has compression stockings however they are  probably not sufficient enough to control swelling. They tell us that things deteriorated for him in late August he was admitted to [facility] for 7 days. This was with cellulitis I think of his bilateral lower legs. Discharge he was noted to have wounds on his bilateral lower legs. He was discharged on Bactrim. They tried to get him home health through [facility] of course they declined him. His wife is been wrapping these applying some form of silver foam dressing. He has a history of wounds before although nothing that would not heal with basic home topical dressings. He has 2 areas on the left medial, left anterior and left lateral and a smaller area on the right medial. All of these have considerable depth. Past medical history includes iron deficiency anemia, lymphedema followed by the rehab center at [facility] with lymphedema wraps I believe, DVT on chronic anticoagulation, prostate cancer, chronic venous insufficiency, hypertension. As mentioned he has compression pumps but does not use them. ABIs in our clinic were noncompressible bilaterally 10/14; patient with severe bilateral lymphedema right greater than left. He came in with bilateral lower extremity wounds left greater than right. Even though the right side has more of the edema most of the wounds here almost closed on the right medial. He has 3 remaining wounds on the left We have been using silver alginate under 4-layer compression I have been trying to get him to be compliant with his external compression pumps 10/21; patient with 3 small wounds on the left leg and 1 on the right medial in the setting of severe lymphedema and chronic venous insufficiency. We have been using silver alginate under 4-layer compression he is using his external compression pumps twice a day 11/4; ARTERIAL STUDIES on the right show an ABI of 1.02 TBI of 0.858 with biphasic waveforms on the left 0.98 with a TBI of 0.55 and biphasic  waveforms. Does not look like he has significant arterial disease. We are treating him for lymphedema he has compression pumps. He has punched-out areas on the left anterior left lateral and right medial lower extremities 11/11; after we obtained his arterial studies I put him in 4 layer compression. He is using his compression pumps probably once a day although I have asked him to do twice. Primary dressing to the wound is silver collagen he has severe lymphedema likely secondary to chronic venous insufficiency. Wounds on the left lateral, left medial and left anterior and a small area on the right medial 12/2; the area on the right anterior lower leg has healed. We initially thought that the area medially had healed as well however when her discharge nurse came in she detected fluid in the wound  simply opened up. This is actually worse than I remember this pain. The area on the left lateral potentially slightly smaller He is also complaining about pain in his left hand he says that this is actually been getting some better he has been using topical creams on this. She asked that I look at this 12/9 after last weeks issues we have 2 wounds one on the right medial lower leg and 1 on the left lateral. Both of these are in the same condition. I think because of thickened skin secondary to chronic lymphedema these wounds actually have depth of almost 0.8 cm. 12/16; the patient has 2 small but deep wounds one on the right medial and one on the left lateral. The right medial is actually the worst of these. He arrives in clinic today with absolutely terrible edema in the right leg apparently his 4-layer wrap fell down to just above his ankle he did not think about this he is apparently been continuing to use his compression pump twice a day. The left leg looks a lot better. 05/09/2020 upon evaluation today patient appears to be doing decently well in regard to his wounds. Everything is measuring smaller the  right leg still has a little bit deeper wound in the left seems to be almost completely healed in my opinion I am very pleased in general with how things are progressing. He has a 4- layer compression wrap we have been using endoform today we will probably have to use collagen just based on the fact that we do not have endoform it is on order. [name] [name] [name] [name] ([id number]) [id number] Page 8 of 11 1/6; the patient's wound on the left lateral lower leg has healed. Still has 1 on the right medial. 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 Using compression pumps at home twice a day. 1/13; left lateral lower leg is still healed. He has a deep punched out rectangular shaped wound on the right medial calf. Looking down at this it appears that he is attempting to epithelialize around the edges of the wound and on the base as well. His edema is reasonably well controlled we have been using collagen with absolutely no effect 1/20; left lateral lower leg remains closed he has extremitease stockings. The area on the right medial calf I aggressively debrided last week measures larger but the surface looks better. We have been using Hydrofera Blue. We ran Oasis through his insurance but we have not seen the results of this 1/27; left lower leg wound with chronic venous insufficiency and secondary lymphedema. I did aggressive debridement on this last week the wound seems to have come in healthy looking surface using Hydrofera Blue. He was denied for Oasis 2/3; small divot in the right medial lower leg. Under illumination the walls of this divot are epithelialized however the base has slough which I removed with a curette we have been using Hydrofera Blue 2/10 small divot on the right medial lower leg pinpoint illumination at the base of this cone-shaped wound. We have been using Hydrofera Blue but I will switch to calcium alginate this week 2/17; the small divot on  the right medial lower leg is fully epithelialized. There is no visible open area under illumination. He has his own stocking for the right leg similar to the one he has been wearing on the left. 03/26/2022; READMISSION This is a now 77 year old man that we had in the clinic from 02/23/2020 through 07/05/2020. At that point he had  bilateral lower extremity wounds left greater than right in the setting of severe lymphedema. He had already obtained compression pumps ordered for him I think from the wound care clinic in [facility] so I do not really have record of what he has been using. He claims to be using them once a day but there is a problem with the sleeve on the left leg. About 2 weeks ago he was hospitalized from 03/11/2022 through 03/14/2022 with diastolic congestive heart failure. 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 He was diuresed. Developed some prerenal azotemia and he has not been taking any diuretics currently. He has not been putting stockings on his legs since he got out of hospital and still has his legs dependent for long periods. Past medical history history of prostate cancer treated with prostatectomy and radiation this was apparently about 8 years ago, history of DVT on chronic Coumadin, history of lymphedema was managed for a while at the clinic in [place]. History of inguinal hernia repair in September 22, hypertension, stage IIIb chronic renal failure ABIs today were noncompressible on the right 1.12 on the left 04-02-2022 upon evaluation today patient appears to be doing well currently in regard to his legs I do feel like both areas that are draining are actually much drier than they were in the picture last week although the left is drier than the right. He is tolerating the 4-layer compression wraps at this point he did contact the pump company and they are actually working on getting him a new compression sleeve  for one of his legs which have previously popped and was not functioning properly. 04-23-2022 upon evaluation today patient appears to be doing well currently in regard to his wounds on the legs. I am actually very pleased with where things stand and I do feel like that we are headed in the right direction. Fortunately there is no sign of active infection locally or systemically at this time. 05-07-2022 upon evaluation today patient appears to be doing well currently in regard to his wounds in fact things are showing signs of improvement which is good news I do not see too much that actually appears to be open and I am very pleased in that regard. No fevers, chills, nausea, vomiting, or diarrhea. 05-21-2022 upon evaluation today patient appears to be doing somewhat poorly in regard to drainage of his lower extremities bilaterally. The right is greater than left as far as the weeping area. Nonetheless it seems to be getting worse not better. He actually has pitting edema which is at least 2+ to the thighs and I am concerned about the fact that he is may be fluid overloaded in general and that is the reason why we cannot get this under control. I know he is not using his pumps all the time because he actually told the nurse that he was either going to pump or he was going to use his fluid pills but not do both. For that reason I do think that he needs to be really doing both in order to get the fluid out as effectively as possible obviously with the 4-layer compression wraps were doing as much as we can from a compression standpoint but it is really not enough. He tells me that he elevates his leg is much as he can in between pumping and other activity throughout the day. 05-28-2022 upon evaluation today patient appears to be doing better in  regard to his wounds although the measurements may be a little bit larger this is a very difficult wound to heal it is very indistinct in a lot of areas. Nonetheless  there is can be some need for sharp debridement in regard to both medial and lateral legs. Fortunately I see no signs of active infection locally nor systemically at this time. No fevers, chills, nausea, vomiting, or diarrhea. 06-04-2022 upon evaluation today patient appears to be doing poorly in general in regard to the wounds on his legs. He still continues to have a tremendous amount of fluid not just in the lower portion of his leg but to be honest his thigh where he has 2-3+ pitting edema in the thigh as well. Unfortunately I do not know that we will be able to get this healed effectively and keep it healed on the lower extremities unless he gets the overall fluid situation taking and under control. Fortunately I do not see any signs of infection locally nor systemically which is great news. He just seems to be very fluid overloaded. 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 He has been tolerating the dressing changes with the compression wraps without complication. Fortunately I do not see any evidence of infection at this time which is great news. No fevers, chills, nausea, vomiting, or diarrhea. 06-18-2022 upon evaluation today patient appears to be doing well currently in regard to his wounds as far as not looking like they are terribly infected but nonetheless I am concerned about a subacute infection secondary to the fact that he continues to have spreading despite the compression therapy. We actually did do an Unna boot on him last week this is actually the first wrap that actually stayed up everything else has been sliding down quite significantly. Fortunately there does not appear to be any signs of infection systemically at this time. With that being said I do believe that locally there seems to be an issue going on here and again I Ernie Hew do a PCR culture to see what that  shows also think that I am going to put him on a broad-spectrum antibiotic, doxycycline to see how that will help as well. He does tell me that coming into the clinic today that he was feeling short of breath like "he was about to have a heart attack" because he was having such a hard time breathing. He says that he told this to Dr. Jodelle Green his cardiologist as well when he was evaluated in the past 1 to 2 weeks. 07-02-2022 upon evaluation today patient appears to be doing poorly currently in regard to his wound. He has been tolerating the dressing changes. Unfortunately he has not had any compression wraps on for the past week because he was unable to make it in for his appointment last week. With that being said he has a significant amount of drainage he tells me has been using his pumps but despite this in the pumps he still has been draining quite a bit. The drainage is also somewhat purulent unfortunately. We did attempt to get in touch with his cardiologist last week unfortunately we were unable to get up with him I did advise that the patient needs to get in touch with him upon leaving today in order to make sure they know he is on the new antibiotics I am going to send him this will be Levaquin and Augmentin. 07-09-2022 upon evaluation today  patient appears to be doing about the same in regard to his legs he may have just a slight amount of improvement with regard to the drainage probably [facility] topical antibiotics are helping in this regard to some degree. Fortunately there does not appear to be any signs of active infection systemically which is great news. No fevers, chills, nausea, vomiting, or diarrhea. 07-16-2022 upon evaluation today patient appears to be doing well currently in regard to his wound. He has been tolerating the dressing changes without complication. Fortunately there does not appear to be any signs of active infection locally nor systemically at this time. With that being  said he cannot keep the wraps up he tells me on the left side he had to cut this down because it got too tight. He has been using his pumps but he is on the right side the wrap actually straight down causing some pushing around the central part of his leg just below the calf I think this is a bigger risk for him that help at this point. I Pryor, [name] [name] ([id number]) [id number] Page 9 of 11 think that we may need to try something different he should be getting his compression socks shortly he tells me they were ordered last Thursday. 3/6; ; this is a patient who lives in [place]. He has severe bilateral lymphedema. He has compression pumps, we have been using kerlix Ace wrap [facility]. He is changing the dressing. We do not have home health. 08-06-2022 upon evaluation today patient appears to be doing a little better in regard to his wounds in general at this point. Fortunately there does not appear to be any signs of active infection locally nor systemically at this time which is great news and overall I am extremely pleased with where we stand today. 08-13-2022 upon evaluation today patient appears to actually be doing significantly better compared to last week. He actually did go to the hospital I told him that he needed to when he left here and he actually did go. With that being said they actually ended up admitting him he was having shortness of breath and I thought it might be related to congestive heart failure turns out he actually had a pulmonary embolism. Subsequently they were able to get him off of the Coumadin switching over to Eliquis to get things stabilized in that regard they also had them wrapped and got his swelling under control on his legs he actually looks much better pretty much across the board at this point. I am very pleased in that regard. With that being said I am very happy that he finally went that could have been a very dangerous  situation. 08-20-2022 upon evaluation today patient appears to be doing well currently in regard to his wound. Has been tolerating the dressing changes without complication. Fortunately there does not appear to be any signs of active infection locally nor systemically at this time. I think his legs are doing better there is some need for sharp debridement today. 08-27-2022 upon evaluation patient is actually making excellent progress. I am actually very pleased with where he stands and I think that he is moving in the right direction. In general I think that we are looking pretty good at the moment. 09-03-2022 upon evaluation today patient appears to be doing well currently in regard to his wound. He is actually tolerating dressing changes on the left and right leg without complication. Fortunately I do not see any need for debridement of the left  leg the right leg I think we probably do some need to perform some debridement here. 09-10-2022 upon evaluation today patient's wounds actually showed signs of improvement in both legs I do not see much is going require debridement today which was great news. Fortunately I do not see any evidence of infection which I think is also excellent he seems to be using his pumps and doing everything right I am happy about how this is progressing at this point. 09-17-2022 upon evaluation today patient appears to be doing decently well in regard to his wounds. He has been tolerating the dressing changes without complication. Fortunately there does not appear to be any signs of active infection at this time which is good news. Objective Constitutional Well-nourished and well-hydrated in no acute distress. Vitals Time Taken: 2:35 PM, Height: 74 in, Weight: 250 lbs, BMI: 32.1, Temperature: 98.2 F, Pulse: 91 bpm, Respiratory Rate: 18 breaths/min, Blood Pressure: 184/67 mmHg. Respiratory normal breathing without difficulty. Psychiatric this patient is able to make  decisions and demonstrates good insight into disease process. Alert and Oriented x 3. pleasant and cooperative. General Notes: Upon inspection patient's wounds did require sharp debridement clearway some necrotic debris he tolerated this debridement today without complication no pain and postdebridement this appears to be doing much better. Integumentary (Hair, Skin) Wound #10 status is Open. Original cause of wound was Gradually Appeared. The date acquired was: 09/16/2022. The wound is located on the Left,Lateral Lower Leg. The wound measures 0.5cm length x 1.5cm width x 0.1cm depth; 0.589cm^2 area and 0.059cm^3 volume. There is Fat Layer (Subcutaneous Tissue) exposed. There is no tunneling or undermining noted. There is a medium amount of serosanguineous drainage noted. The wound margin is distinct with the outline attached to the wound base. There is large (67-100%) pink, pale granulation within the wound bed. There is no necrotic tissue within the wound bed. The periwound skin appearance did not exhibit: Callus, Crepitus, Excoriation, Induration, Rash, Scarring, Dry/Scaly, Maceration, Atrophie Blanche, Cyanosis, Ecchymosis, Hemosiderin Staining, Mottled, Pallor, Rubor, Erythema. Wound #6 status is Open. Original cause of wound was Gradually Appeared. The date acquired was: 03/05/2022. The wound has been in treatment 25 weeks. The wound is located on the Right,Medial Lower Leg. The wound measures 8.5cm length x 4.3cm width x 0.1cm depth; 28.706cm^2 area and 2.871cm^3 volume. There is Fat Layer (Subcutaneous Tissue) exposed. There is no tunneling or undermining noted. There is a medium amount of serosanguineous drainage noted. The wound margin is distinct with the outline attached to the wound base. There is small (1-33%) pink granulation within the wound bed. There is a large (67- 100%) amount of necrotic tissue within the wound bed including Adherent Slough. The periwound skin appearance exhibited:  Scarring, Hemosiderin Staining. The periwound skin appearance did not exhibit: Callus, Crepitus, Excoriation, Induration, Rash, Dry/Scaly, Maceration, Atrophie Blanche, Cyanosis, Ecchymosis, Mottled, Pallor, Rubor, Erythema. Wound #7 status is Open. Original cause of wound was Gradually Appeared. The date acquired was: 03/05/2022. The wound has been in treatment 25 weeks. The wound is located on the Left,Medial Lower Leg. The wound measures 2.4cm length x 1.3cm width x 0.1cm depth; 2.45cm^2 area and 0.245cm^3 volume. There is Fat Layer (Subcutaneous Tissue) exposed. There is no tunneling or undermining noted. There is a medium amount of serosanguineous drainage noted. The wound margin is distinct with the outline attached to the wound base. There is large (67-100%) red, pink granulation within the wound bed. There is a small (1-33%) amount of necrotic tissue within the  wound bed including Adherent Slough. The periwound skin appearance exhibited: Scarring, Maceration, Hemosiderin Staining. The periwound skin appearance did not exhibit: Callus, Crepitus, Excoriation, Induration, Rash, Dry/Scaly, Atrophie Blanche, Cyanosis, Ecchymosis, Mottled, Pallor, Rubor, Erythema. 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 There was a Excisional Skin/Subcutaneous Tissue Debridement with a total area of 14.13 sq cm performed by [name], PA. With the following instrument(s): Curette to remove Viable and  Non-Viable tissue/material. Material removed includes Subcutaneous Tissue, Slough, Skin: Dermis, Skin: Epidermis, and Biofilm after achieving pain control using Lidocaine 4% T opical Solution. A time out was conducted at 15:00, prior to the start of the procedure. A Minimum amount of bleeding was controlled with Pressure. The procedure was tolerated well with a pain level of 0 throughout and a pain level of 0 following the procedure. Post Debridement Measurements: 6cm length x 3cm width x 0.2cm depth; 2.827cm^3 volume. Character of Wound/Ulcer Post Debridement is improved. Post procedure Diagnosis Wound #6: Same as Pre-Procedure Wound #7 Pre-procedure diagnosis of Wound #7 is a Lymphedema located on the Left,Medial Lower Leg . There was a Excisional Skin/Subcutaneous Tissue Debridement with a total area of 2.45 sq cm performed by [name], PA. With the following instrument(s): Curette to remove Viable and Non-Viable tissue/material. Material removed includes Subcutaneous Tissue, Slough, Skin: Dermis, Skin: Epidermis, and Biofilm after achieving pain control using Lidocaine 4% T opical Solution. A time out was conducted at 15:00, prior to the start of the procedure. A Minimum amount of bleeding was controlled with Pressure. The procedure was tolerated well with a pain level of 0 throughout and a pain level of 0 following the procedure. Post Debridement Measurements: 2.4cm length x 1.3cm width x 0.2cm depth; 0.49cm^3 volume. Character of Wound/Ulcer Post Debridement is improved. Post procedure Diagnosis Wound #7: Same as Pre-Procedure Plan Follow-up Appointments: Return Appointment in 1 week. Leonard Schwartz Wednesday 09/24/2022 215pm room 8 Return Appointment in 2 weeks. Leonard Schwartz Wednesday [id number] 215pm room 8 Other: - ***Change daily. ***** Anesthetic: (In clinic) Topical Lidocaine 5% applied to wound bed Bathing/ Shower/ Hygiene: May shower with protection but do not get wound dressing(s) wet.  Protect dressing(s) with water repellant cover (for example, large plastic bag) or a cast cover and may then take shower. Edema Control - Lymphedema / SCD / Other: Lymphedema Pumps. Use Lymphedema pumps on leg(s) 2-3 times a day for 45-60 minutes. If wearing any wraps or hose, do not remove them. Continue exercising as instructed. - *****pump 3 times a day for an hour each time.***** Elevate legs to the level of the heart or above for 30 minutes daily and/or when sitting for 3-4 times a day throughout the day. - important Avoid standing for long periods of time. Exercise regularly Compression stocking or Garment 30-40 mm/Hg pressure to: - juxtafit Essentials Long for bilateral lower legs; quantity three juxtafit essentials per limb. WOUND #6: - Lower Leg Wound Laterality: Right, Medial Cleanser: Soap and Water 1 x Per Day/30 Days Discharge Instructions: May shower and wash wound with dial antibacterial soap and water prior to dressing change. Peri-Wound Care: [name] [name] (  Moisturizing lotion) 1 x Per Day/30 Days Discharge Instructions: Apply moisturizing lotion as directed Prim Dressing: Maxorb Extra Ag+ Alginate Dressing, 4x4.75 (in/in) 1 x Per Day/30 Days ary Discharge Instructions: Apply to wound bed as instructed Prim Dressing: Compounding topical antibiotics- [facility] 1 x Per Day/30 Days ary Discharge Instructions: apply under alignate Ag when patient brings with appts. Secondary Dressing: ABD Pad, 5x9 1 x Per Day/30 Days Discharge Instructions: Apply over primary dressing as directed. Secondary Dressing: Woven Gauze Sponge, Non-Sterile 4x4 in 1 x Per Day/30 Days Discharge Instructions: Apply over primary dressing as directed. Secured With: [employer], 4.5x3.1 (in/yd) 1 x Per Day/30 Days Discharge Instructions: Secure with Kerlix as directed. Secured With: 611M Medipore H Soft Cloth Surgical T ape, 4 x 10 (in/yd) 1 x Per Day/30 Days Discharge Instructions: Secure with  tape as directed. Com pression Wrap: Juxtafit 30-14mmHg 1 x Per Day/30 Days Discharge Instructions: apply in the morning and remove at night. WOUND #7: - Lower Leg Wound Laterality: Left, Medial Cleanser: Soap and Water 1 x Per Day/30 Days Discharge Instructions: May shower and wash wound with dial antibacterial soap and water prior to dressing change. Peri-Wound Care: Sween Lotion (Moisturizing lotion) 1 x Per Day/30 Days Discharge Instructions: Apply moisturizing lotion as directed Prim Dressing: Maxorb Extra Ag+ Alginate Dressing, 4x4.75 (in/in) 1 x Per Day/30 Days ary Discharge Instructions: Apply to wound bed as instructed Prim Dressing: Compounding topical antibiotics- [facility] 1 x Per Day/30 Days ary Discharge Instructions: apply under alignate Ag when patient brings with appts. Secondary Dressing: ABD Pad, 5x9 1 x Per Day/30 Days Discharge Instructions: Apply over primary dressing as directed. Secondary Dressing: Woven Gauze Sponge, Non-Sterile 4x4 in 1 x Per Day/30 Days Discharge Instructions: Apply over primary dressing as directed. Steeber, [name] [name] ([id number]) [id number] Page 11 of 11 Secured With: [employer], 4.5x3.1 (in/yd) 1 x Per Day/30 Days Discharge Instructions: Secure with Kerlix as directed. Secured With: 611M Medipore H Soft Cloth Surgical T ape, 4 x 10 (in/yd) 1 x Per Day/30 Days Discharge Instructions: Secure with tape as directed. Compression Wrap: Juxtafit 30-38mmHg 1 x Per Day/30 Days Discharge Instructions: apply in the morning and remove at night. 1. Recommend currently the patient should continue to monitor for any signs of infection or worsening. Based on what I am seeing I do believe that we are making good progress here. 2. I am also can recommend that the patient should continue to utilize the silver alginate dressing followed by ABD pad and again were using the juxta lite compression wraps which are doing really  well. 3. I am also can recommend the patient should continue with lymphedema pumps. We will see patient back for reevaluation in 1 week here in the clinic. If anything worsens or changes patient will contact our office for additional recommendations. Electronic Signature(s) Signed: 09/17/2022 3:19:23 PM By: [name] PA-C Entered By: [name] on 09/17/2022 15:19:23 -------------------------------------------------------------------------------- SuperBill Details Patient Name: Date of Service: Briseno, [name] [name] 09/17/2022 Medical Record Number: [id number] Patient Account Number: [id number] Date of Birth/Sex: Treating RN: [birth year] (77 y.o. [name] [name], Millard.Loa Primary Care Provider: [name] Other Clinician: Referring Provider: Treating Provider/Extender: [name] Weeks in Treatment: 25 Diagnosis Coding ICD-10 Codes Code Description [phone] Chronic venous hypertension (idiopathic) with ulcer and inflammation of bilateral lower extremity I89.0 Lymphedema, not elsewhere classified 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 other  part of right lower leg with other specified severity Facility Procedures : CPT4 Code: [id number] Description: 11042 - DEB SUBQ TISSUE 20 SQ CM/< ICD-10 Diagnosis Description L97.828 Non-pressure chronic ulcer of other part of left lower leg with other specified 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 other part of right lower leg with other specified Modifier: severity severity Quantity: 1 Physician Procedures : CPT4 Code Description Modifier [id number] 11042 - WC PHYS SUBQ TISS 20 SQ CM ICD-10 Diagnosis Description 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 Quantity: 1 Electronic  Signature(s) Signed: 09/17/2022 3:19:36 PM By: [name] PA-C Entered By: [name] on 09/17/2022 15:19:36

## 2022-09-19 NOTE — Progress Notes (Signed)
[name] ([id number]) [phone].pdf Page 1 of 8 Visit Report for 09/17/2022 Arrival Information Details Patient Name: Date of Service: [name], [name] 09/17/2022 2:15 PM Medical Record Number: [id number] Patient Account Number: [id number] Date of Birth/Sex: Treating RN: July 15, 1945 (77 y.o. M) Primary Care [name]: [name] Other Clinician: Referring [name]: Treating [name]/Extender: [name] in Treatment: 25 Visit Information History Since Last Visit Added or deleted any medications: No Patient Arrived: Cane Any new allergies or adverse reactions: No Arrival Time: 14:35 Had a fall or experienced change in No Accompanied By: wife activities of daily living that may affect Transfer Assistance: None risk of falls: Patient Identification Verified: Yes Signs or symptoms of abuse/neglect since last visito No Secondary Verification Process Completed: Yes Hospitalized since last visit: No Patient Requires Transmission-Based Precautions: No Implantable device outside of the clinic excluding No Patient Has Alerts: Yes cellular tissue based products placed in the center Patient Alerts: Patient on Blood Thinner since last visit: Right ABI in clinic [place] Has Dressing in Place as Prescribed: Yes Pain Present Now: No Electronic Signature(s) Signed: 09/18/2022 2:39:11 PM By: [name] Entered By: [name] on 09/17/2022 14:35:17 -------------------------------------------------------------------------------- Encounter Discharge Information Details Patient Name: Date of Service: [name], [name] 09/17/2022 2:15 PM Medical Record Number: [id number] Patient Account Number: [id number] Date of Birth/Sex: Treating RN: Apr 05, 1946 (77 y.o. [name] Primary Care Sharmarke Cicio: [name] Other Clinician: Referring Teeghan Hammer: Treating Jermario Kalmar/Extender: [name] in Treatment:  25 Encounter Discharge Information Items Discharge Condition: Stable Ambulatory Status: Cane Discharge Destination: Home Transportation: Private Auto Accompanied By: spouse Schedule Follow-up Appointment: Yes Clinical Summary of Care: Electronic Signature(s) Signed: 09/17/2022 4:44:49 PM By: [name] Stall RN, [name] Entered By: [name] on 09/17/2022 15:04:23 -------------------------------------------------------------------------------- Lower Extremity Assessment Details Patient Name: Date of Service: [name], [name] 09/17/2022 2:15 PM Medical Record Number: [id number] Patient Account Number: [id number] Date of Birth/Sex: Treating RN: 1946/01/13 (77 y.o. [name] Primary Care Juletta Berhe: [name] Other Clinician: Referring Royer Cristobal: Treating Ritter Helsley/Extender: Leveda Anna, [name] Weeks in Treatment: 25 Edema Assessment Assessed: [Left: No] [Right: No] J[Left: [name], Dannielle [name] ([id number])] [Right: [id number] Page 2 of 8] Edema: [Left: Yes] [Right: Yes] Calf Left: Right: Point of Measurement: 40 cm From Medial Instep 52 cm 62.2 cm Ankle Left: Right: Point of Measurement: 13 cm From Medial Instep 43 cm 42 cm Electronic Signature(s) Signed: 09/17/2022 4:44:49 PM By: [name] Stall RN, [name] Entered By: [name] on 09/17/2022 14:59:50 -------------------------------------------------------------------------------- Multi-Disciplinary Care Plan Details Patient Name: Date of Service: [name], [name] 09/17/2022 2:15 PM Medical Record Number: [id number] Patient Account Number: [id number] Date of Birth/Sex: Treating RN: 12/14/1945 (77 y.o. [name] Primary Care Raymont Andreoni: [name] Other Clinician: Referring Debbera Wolken: Treating Laurier Jasperson/Extender: [name] Weeks in Treatment: 25 Active Inactive Wound/Skin Impairment Nursing Diagnoses: Impaired tissue integrity Knowledge deficit related to  ulceration/compromised skin integrity Goals: Patient will have a decrease in wound volume by X% from date: (specify in notes) Date Initiated: 03/26/2022 Target Resolution Date: 11/14/2022 Goal Status: Active Patient/caregiver will verbalize understanding of skin care regimen Date Initiated: 03/26/2022 Target Resolution Date: 11/14/2022 Goal Status: Active Ulcer/skin breakdown will have a volume reduction of 30% by week 4 Date Initiated: 03/26/2022 Date Inactivated: 05/21/2022 Target Resolution Date: 05/17/2022 Unmet Reason: see wound Goal Status: Unmet measurement. Ulcer/skin breakdown will have a volume reduction of 50% by week 8 Date Initiated: 03/26/2022 Date Inactivated: 05/21/2022 Target  Resolution Date: 05/17/2022 Unmet Reason: see wound Goal Status: Unmet measurement. Interventions: Assess patient/caregiver ability to obtain necessary supplies Assess patient/caregiver ability to perform ulcer/skin care regimen upon admission and as needed Assess ulceration(s) every visit Notes: Patient stated today, "I will take my fluid pill or pump not do both." Bralynn Donado made aware. Electronic Signature(s) Signed: 09/17/2022 4:44:49 PM By: [name] Stall RN, [name] Entered By: [name] on 09/17/2022 15:03:31 -------------------------------------------------------------------------------- Pain Assessment Details Patient Name: Date of Service: [name], [name] 09/17/2022 2:15 PM Medical Record Number: [id number] Patient Account Number: [id number] Date of Birth/Sex: Treating RN: [birth year] (77 y.o. M) Primary Care Sherria Riemann: [name] Other Clinician: Borg, Dannielle [name] ([id number]) [id number] Page 3 of 8 Referring Ruben Mahler: Treating Jonell Brumbaugh/Extender: Leveda Anna, [name] Weeks in Treatment: 25 Active Problems Location of Pain Severity and Description of Pain Patient Has Paino No Site Locations Pain Management and Medication Current Pain  Management: Electronic Signature(s) Signed: 09/18/2022 2:39:11 PM By: [name] Entered By: [name] on 09/17/2022 14:35:56 -------------------------------------------------------------------------------- Patient/Caregiver Education Details Patient Name: Date of Service: [name], [name] 5/1/2024andnbsp2:15 PM Medical Record Number: [id number] Patient Account Number: [id number] Date of Birth/Gender: Treating RN: 10/31/1945 (77 y.o. [name] Primary Care Physician: [name] Other Clinician: Referring Physician: Treating Physician/Extender: [name] in Treatment: 25 Education Assessment Education Provided To: Patient Education Topics Provided Wound/Skin Impairment: Handouts: Caring for Your Ulcer Methods: Explain/Verbal Responses: Reinforcements needed Electronic Signature(s) Signed: 09/17/2022 4:44:49 PM By: [name] Stall RN, [name] Entered By: [name] on 09/17/2022 15:03:43 -------------------------------------------------------------------------------- Wound Assessment Details Patient Name: Date of Service: Stjames, [name] 09/17/2022 2:15 PM Medical Record Number: [id number] Patient Account Number: [id number] Date of Birth/Sex: Treating RN: 1946-05-10 (76 y.o. M) Primary Care Taleia Sadowski: [name] Other Clinician: Referring [name]: Treating Seferino Oscar/Extender: [name] Stonerock, Dannielle [name] ([id number]) [id number] Page 4 of 8 Weeks in Treatment: 25 Wound Status Wound Number: 10 Primary Lymphedema Etiology: Wound Location: Left, Lateral Lower Leg Wound Open Wounding Event: Gradually Appeared Status: Date Acquired: 09/16/2022 Comorbid Anemia, Lymphedema, Deep Vein Thrombosis, Hypertension, Weeks Of Treatment: 0 History: Received Radiation Clustered Wound: No Photos Wound Measurements Length: (cm) 0.5 Width: (cm) 1.5 Depth: (cm) 0.1 Area: (cm) 0.589 Volume:  (cm) 0.059 % Reduction in Area: % Reduction in Volume: Epithelialization: Medium (34-66%) Tunneling: No Undermining: No Wound Description Classification: Full Thickness Without Exposed Suppor Wound Margin: Distinct, outline attached Exudate Amount: Medium Exudate Type: Serosanguineous Exudate Color: red, brown t Structures Foul Odor After Cleansing: No Slough/Fibrino Yes Wound Bed Granulation Amount: Large (67-100%) Exposed Structure Granulation Quality: Pink, Pale Fascia Exposed: No Necrotic Amount: None Present (0%) Fat Layer (Subcutaneous Tissue) Exposed: Yes Tendon Exposed: No Muscle Exposed: No Joint Exposed: No Bone Exposed: No Periwound Skin Texture Texture Color No Abnormalities Noted: No No Abnormalities Noted: No Callus: No Atrophie Blanche: No Crepitus: No Cyanosis: No Excoriation: No Ecchymosis: No Induration: No Erythema: No Rash: No Hemosiderin Staining: No Scarring: No Mottled: No Pallor: No Moisture Rubor: No No Abnormalities Noted: No Dry / Scaly: No Maceration: No Treatment Notes Wound #10 (Lower Leg) Wound Laterality: Left, Lateral Cleanser Peri-Wound Care Topical Primary Dressing Secondary Dressing Grothaus, Bryam ([id number]) [id number] Page 5 of 8 Secured With Compression Wrap Compression Stockings Facilities manager) Signed: 09/17/2022 4:44:49 PM By: [name] Stall RN, [name] Entered By: [name] on 09/17/2022 15:00:45 -------------------------------------------------------------------------------- Wound Assessment Details Patient Name: Date of Service: Allocca, [name] 09/17/2022 2:15 PM Medical Record Number: [id number] Patient  Account Number: [id number] Date of Birth/Sex: Treating RN: 1945/12/03 (77 y.o. M) Primary Care Arrion Burruel: [name] Other Clinician: Referring Jeanett Antonopoulos: Treating Lexani Corona/Extender: Leveda Anna, [name] Weeks in Treatment: 25 Wound Status Wound  Number: 6 Primary Lymphedema Etiology: Wound Location: Right, Medial Lower Leg Wound Open Wounding Event: Gradually Appeared Status: Date Acquired: 03/05/2022 Comorbid Anemia, Lymphedema, Deep Vein Thrombosis, Hypertension, Weeks Of Treatment: 25 History: Received Radiation Clustered Wound: Yes Photos Wound Measurements Length: (cm) Width: (cm) Depth: (cm) Clustered Quantity: Area: (cm) Volume: (cm) 8.5 % Reduction in Area: 79.9% 4.3 % Reduction in Volume: 79.9% 0.1 Epithelialization: Medium (34-66%) 1 Tunneling: No 28.706 Undermining: No 2.871 Wound Description Classification: Full Thickness Without Exposed Sup Wound Margin: Distinct, outline attached Exudate Amount: Medium Exudate Type: Serosanguineous Exudate Color: red, brown port Structures Foul Odor After Cleansing: No Slough/Fibrino Yes Wound Bed Granulation Amount: Small (1-33%) Exposed Structure Granulation Quality: Pink Fascia Exposed: No Necrotic Amount: Large (67-100%) Fat Layer (Subcutaneous Tissue) Exposed: Yes Necrotic Quality: Adherent Slough Tendon Exposed: No Muscle Exposed: No Joint Exposed: No Bone Exposed: No Periwound Skin Texture Texture Color No Abnormalities Noted: No No Abnormalities Noted: No Callus: No Atrophie Blanche: No Issa, Doran ([id number]) [id number] Page 6 of 8 Crepitus: No Cyanosis: No Excoriation: No Ecchymosis: No Induration: No Erythema: No Rash: No Hemosiderin Staining: Yes Scarring: Yes Mottled: No Pallor: No Moisture Rubor: No No Abnormalities Noted: No Dry / Scaly: No Maceration: No Treatment Notes Wound #6 (Lower Leg) Wound Laterality: Right, Medial Cleanser Soap and Water Discharge Instruction: May shower and wash wound with dial antibacterial soap and water prior to dressing change. Peri-Wound Care Sween Lotion (Moisturizing lotion) Discharge Instruction: Apply moisturizing lotion as directed Topical Primary  Dressing Maxorb Extra Ag+ Alginate Dressing, 4x4.75 (in/in) Discharge Instruction: Apply to wound bed as instructed Compounding topical antibiotics- [facility] Discharge Instruction: apply under alignate Ag when patient brings with appts. Secondary Dressing ABD Pad, 5x9 Discharge Instruction: Apply over primary dressing as directed. Woven Gauze Sponge, Non-Sterile 4x4 in Discharge Instruction: Apply over primary dressing as directed. Secured With [employer], 4.5x3.1 (in/yd) Discharge Instruction: Secure with Kerlix as directed. 32M Medipore H Soft Cloth Surgical T ape, 4 x 10 (in/yd) Discharge Instruction: Secure with tape as directed. Compression Wrap Juxtafit 30-73mmHg Discharge Instruction: apply in the morning and remove at night. Compression Stockings Add-Ons Electronic Signature(s) Signed: 09/17/2022 4:44:49 PM By: [name] Stall RN, [name] Entered By: [name] on 09/17/2022 15:01:00 -------------------------------------------------------------------------------- Wound Assessment Details Patient Name: Date of Service: Ossa, [name] 09/17/2022 2:15 PM Medical Record Number: [id number] Patient Account Number: [id number] Date of Birth/Sex: Treating RN: 1945/11/22 (77 y.o. M) Primary Care Delyla Sandeen: [name] Other Clinician: Referring Freya Zobrist: Treating Avian Konigsberg/Extender: Leveda Anna, [name] Weeks in Treatment: 25 Wound Status Wound Number: 7 Primary Lymphedema Etiology: Wound Location: Left, Medial Lower Leg Wound Open Wounding Event: Gradually Appeared Status: Date Acquired: 03/05/2022 Comorbid Anemia, Lymphedema, Deep Vein Thrombosis, Hypertension, Weeks Of Treatment: 25 History: Received Radiation Clustered Wound: Yes Zmuda, Skip ([id number]) [id number] Page 7 of 8 Photos Wound Measurements Length: (cm) Width: (cm) Depth: (cm) Clustered Quantity: Area: (cm) Volume: (cm) 2.4 % Reduction in  Area: 94.8% 1.3 % Reduction in Volume: 94.8% 0.1 Epithelialization: Large (67-100%) 1 Tunneling: No 2.45 Undermining: No 0.245 Wound Description Classification: Full Thickness Without Exposed Sup Wound Margin: Distinct, outline attached Exudate Amount: Medium Exudate Type: Serosanguineous Exudate Color: red, brown port Structures Foul Odor After Cleansing: No Slough/Fibrino Yes Wound Bed Granulation Amount:  Large (67-100%) Exposed Structure Granulation Quality: Red, Pink Fascia Exposed: No Necrotic Amount: Small (1-33%) Fat Layer (Subcutaneous Tissue) Exposed: Yes Necrotic Quality: Adherent Slough Tendon Exposed: No Muscle Exposed: No Joint Exposed: No Bone Exposed: No Periwound Skin Texture Texture Color No Abnormalities Noted: No No Abnormalities Noted: No Callus: No Atrophie Blanche: No Crepitus: No Cyanosis: No Excoriation: No Ecchymosis: No Induration: No Erythema: No Rash: No Hemosiderin Staining: Yes Scarring: Yes Mottled: No Pallor: No Moisture Rubor: No No Abnormalities Noted: No Dry / Scaly: No Maceration: Yes Treatment Notes Wound #7 (Lower Leg) Wound Laterality: Left, Medial Cleanser Soap and Water Discharge Instruction: May shower and wash wound with dial antibacterial soap and water prior to dressing change. Peri-Wound Care Sween Lotion (Moisturizing lotion) Discharge Instruction: Apply moisturizing lotion as directed Topical Primary Dressing Maxorb Extra Ag+ Alginate Dressing, 4x4.75 (in/in) Discharge Instruction: Apply to wound bed as instructed Compounding topical antibiotics- [facility] Discharge Instruction: apply under alignate Ag when patient brings with appts. Secondary Dressing Gleghorn, Dannielle [name] ([id number]) [phone].pdf Page 8 of 8 ABD Pad, 5x9 Discharge Instruction: Apply over primary dressing as directed. Woven Gauze Sponge, Non-Sterile 4x4 in Discharge Instruction: Apply over primary dressing as  directed. Secured With [employer], 4.5x3.1 (in/yd) Discharge Instruction: Secure with Kerlix as directed. 50M Medipore H Soft Cloth Surgical T ape, 4 x 10 (in/yd) Discharge Instruction: Secure with tape as directed. Compression Wrap Juxtafit 30-68mmHg Discharge Instruction: apply in the morning and remove at night. Compression Stockings Add-Ons Electronic Signature(s) Signed: 09/17/2022 4:44:49 PM By: [name] Stall RN, [name] Entered By: [name] on 09/17/2022 15:01:12 -------------------------------------------------------------------------------- Vitals Details Patient Name: Date of Service: Heggie, [name] 09/17/2022 2:15 PM Medical Record Number: [id number] Patient Account Number: [id number] Date of Birth/Sex: Treating RN: [birth year] (77 y.o. M) Primary Care Mithran Strike: [name] Other Clinician: Referring Nicholaus Steinke: Treating Bradey Luzier/Extender: [name] Weeks in Treatment: 25 Vital Signs Time Taken: 14:35 Temperature (F): 98.2 Height (in): 74 Pulse (bpm): 91 Weight (lbs): 250 Respiratory Rate (breaths/min): 18 Body Mass Index (BMI): 32.1 Blood Pressure (mmHg): 184/67 Reference Range: 80 - 120 mg / dl Electronic Signature(s) Signed: 09/18/2022 2:39:11 PM By: [name] Entered By: [name] on 09/17/2022 14:35:49

## 2022-09-24 ENCOUNTER — Encounter (HOSPITAL_BASED_OUTPATIENT_CLINIC_OR_DEPARTMENT_OTHER): Payer: Medicare Other | Admitting: Physician Assistant

## 2022-09-24 DIAGNOSIS — I890 Lymphedema, not elsewhere classified: Secondary | ICD-10-CM | POA: Diagnosis not present

## 2022-09-24 NOTE — Progress Notes (Signed)
Akers, [name] ([id number]) [id number] Page 1 of 13 Visit Report for 09/24/2022 Chief Complaint Document Details Patient Name: Date of Service: [name], [name] 09/24/2022 2:15 PM Medical Record Number: [id number] Patient Account Number: [id number] Date of Birth/Sex: Treating RN: 1946-04-14 (77 y.o. M) Primary Care Provider: [name] Other Clinician: Referring Provider: Treating Provider/Extender: [name] Weeks in Treatment: 26 Information Obtained from: Patient Chief Complaint 02/23/2020; patient is here for wounds on his bilateral lower legs in the setting of severe lymphedema 03/26/2022; patient is here for wounds on his bilateral lower legs medial aspect Electronic Signature(s) Signed: 09/24/2022 3:26:48 PM By: [name] PA-C Entered By: [name] on 09/24/2022 15:26:48 -------------------------------------------------------------------------------- Debridement Details Patient Name: Date of Service: Rueth, [name] 09/24/2022 2:15 PM Medical Record Number: [id number] Patient Account Number: [id number] Date of Birth/Sex: Treating RN: 1945/06/19 (76 y.o. [name], Millard.Loa Primary Care Provider: [name] Other Clinician: Referring Provider: Treating Provider/Extender: [name] Weeks in Treatment: 26 Debridement Performed for Assessment: Wound #7 Left,Medial Lower Leg Performed By: Physician [name], PA Debridement Type: Debridement Level of Consciousness (Pre-procedure): Awake and Alert Pre-procedure Verification/Time Out Yes - 14:45 Taken: Start Time: 14:46 Pain Control: Lidocaine 4% T opical Solution Percent of Wound Bed Debrided: 100% T Area Debrided (cm): otal 7.97 Tissue and other material debrided: Viable, Non-Viable, Slough, Subcutaneous, Biofilm, Slough Level: Skin/Subcutaneous Tissue Debridement Description: Excisional Instrument: Curette Bleeding: Minimum Hemostasis  Achieved: Pressure End Time: 14:48 Procedural Pain: 0 Post Procedural Pain: 0 Response to Treatment: Procedure was tolerated well Level of Consciousness (Post- Awake and Alert procedure): Post Debridement Measurements of Total Wound Length: (cm) 2.9 Width: (cm) 3.5 Depth: (cm) 0.1 Volume: (cm) 0.797 Character of Wound/Ulcer Post Debridement: Improved Post Procedure Diagnosis Same as Pre-procedure Electronic Signature(s) Signed: 09/24/2022 4:34:38 PM By: [name] PA-C Signed: 09/24/2022 4:57:58 PM By: [name] RN, [name] Entered By: [name] on 09/24/2022 14:48:47 Enyeart, [name] ([id number]) [id number] Page 2 of 13 -------------------------------------------------------------------------------- Debridement Details Patient Name: Date of Service: Belles, [name] 09/24/2022 2:15 PM Medical Record Number: [id number] Patient Account Number: [id number] Date of Birth/Sex: Treating RN: August 02, 1945 (77 y.o. [name], Millard.Loa Primary Care Provider: [name] Other Clinician: Referring Provider: Treating Provider/Extender: [name] Weeks in Treatment: 26 Debridement Performed for Assessment: Wound #6 Right,Medial Lower Leg Performed By: Physician [name], PA Debridement Type: Debridement Level of Consciousness (Pre-procedure): Awake and Alert Pre-procedure Verification/Time Out Yes - 14:45 Taken: Start Time: 14:46 Pain Control: Lidocaine 4% T opical Solution Percent of Wound Bed Debrided: 100% T Area Debrided (cm): otal 25.12 Tissue and other material debrided: Viable, Non-Viable, Slough, Subcutaneous, Biofilm, Slough Level: Skin/Subcutaneous Tissue Debridement Description: Excisional Instrument: Curette Bleeding: Minimum Hemostasis Achieved: Pressure End Time: 14:48 Procedural Pain: 0 Post Procedural Pain: 0 Response to Treatment: Procedure was tolerated well Level of Consciousness (Post- Awake and  Alert procedure): Post Debridement Measurements of Total Wound Length: (cm) 8 Width: (cm) 4 Depth: (cm) 0.4 Volume: (cm) 10.053 Character of Wound/Ulcer Post Debridement: Improved Post Procedure Diagnosis Same as Pre-procedure Electronic Signature(s) Signed: 09/24/2022 4:34:38 PM By: [name] PA-C Signed: 09/24/2022 4:57:58 PM By: [name] RN, [name] Entered By: [name] on 09/24/2022 14:49:19 -------------------------------------------------------------------------------- HPI Details Patient Name: Date of Service: [name], [name] 09/24/2022 2:15 PM Medical Record Number: [id number] Patient Account Number: [id number] Date of Birth/Sex: Treating RN: Jul 15, 1945 (77 y.o. M) Primary Care Provider: [name] Other Clinician: Referring Provider:  Treating Provider/Extender: [name] Weeks in Treatment: 26 History of Present Illness HPI Description: ADMISSION 02/23/2020 Patient is a 77 year old man who lives in [place] who arrives accompanied by his wife. He has a history of chronic lymphedema and venous insufficiency in his bilateral lower legs which may have something to do that with having a history of DVT as well as being treated for prostate cancer. In any case he recently got compression pumps at home but compliance has been an issue here. He has compression stockings however they are probably not sufficient enough to control swelling. They tell us that things deteriorated for him in late August he was admitted to [facility] for 7 days. This was with cellulitis I think of his bilateral lower legs. Discharge he was noted to have wounds on his bilateral lower legs. He was discharged on Bactrim. They tried to get him home health through [facility] of course they declined him. His wife is been wrapping these applying some form of silver foam dressing. He has a history of wounds before although nothing that would not heal with  basic home topical dressings. He has 2 areas on the left medial, left anterior and left lateral and a smaller area [place], Emonte ([id number]) [id number] Page 3 of 13 on the right medial. All of these have considerable depth. Past medical history includes iron deficiency anemia, lymphedema followed by the rehab center at [facility] with lymphedema wraps I believe, DVT on chronic anticoagulation, prostate cancer, chronic venous insufficiency, hypertension. As mentioned he has compression pumps but does not use them. ABIs in our clinic were noncompressible bilaterally 10/14; patient with severe bilateral lymphedema right greater than left. He came in with bilateral lower extremity wounds left greater than right. Even though the right side has more of the edema most of the wounds here almost closed on the right medial. He has 3 remaining wounds on the left We have been using silver alginate under 4-layer compression I have been trying to get him to be compliant with his external compression pumps 10/21; patient with 3 small wounds on the left leg and 1 on the right medial in the setting of severe lymphedema and chronic venous insufficiency. We have been using silver alginate under 4-layer compression he is using his external compression pumps twice a day 11/4; ARTERIAL STUDIES on the right show an ABI of 1.02 TBI of 0.858 with biphasic waveforms on the left 0.98 with a TBI of 0.55 and biphasic waveforms. Does not look like he has significant arterial disease. We are treating him for lymphedema he has compression pumps. He has punched-out areas on the left anterior left lateral and right medial lower extremities 11/11; after we obtained his arterial studies I put him in 4 layer compression. He is using his compression pumps probably once a day although I have asked him to do twice. Primary dressing to the wound is silver collagen he has severe lymphedema likely secondary to  chronic venous insufficiency. Wounds on the left lateral, left medial and left anterior and a small area on the right medial 12/2; the area on the right anterior lower leg has healed. We initially thought that the area medially had healed as well however when her discharge nurse came in she detected fluid in the wound simply opened up. This is actually worse than I remember this pain. The area on the left lateral potentially slightly smaller He is also complaining about pain in  his left hand he says that this is actually been getting some better he has been using topical creams on this. She asked that I look at this 12/9 after last weeks issues we have 2 wounds one on the right medial lower leg and 1 on the left lateral. Both of these are in the same condition. I think because of thickened skin secondary to chronic lymphedema these wounds actually have depth of almost 0.8 cm. 12/16; the patient has 2 small but deep wounds one on the right medial and one on the left lateral. The right medial is actually the worst of these. He arrives in clinic today with absolutely terrible edema in the right leg apparently his 4-layer wrap fell down to just above his ankle he did not think about this he is apparently been continuing to use his compression pump twice a day. The left leg looks a lot better. 05/09/2020 upon evaluation today patient appears to be doing decently well in regard to his wounds. Everything is measuring smaller the right leg still has a little bit deeper wound in the left seems to be almost completely healed in my opinion I am very pleased in general with how things are progressing. He has a 4- layer compression wrap we have been using endoform today we will probably have to use collagen just based on the fact that we do not have endoform it is on order. 1/6; the patient's wound on the left lateral lower leg has healed. Still has 1 on the right medial. He has severe bilateral lymphedema right  greater than left. Using compression pumps at home twice a day. 1/13; left lateral lower leg is still healed. He has a deep punched out rectangular shaped wound on the right medial calf. Looking down at this it appears that he is attempting to epithelialize around the edges of the wound and on the base as well. His edema is reasonably well controlled we have been using collagen with absolutely no effect 1/20; left lateral lower leg remains closed he has extremitease stockings. The area on the right medial calf I aggressively debrided last week measures larger but the surface looks better. We have been using Hydrofera Blue. We ran Oasis through his insurance but we have not seen the results of this 1/27; left lower leg wound with chronic venous insufficiency and secondary lymphedema. I did aggressive debridement on this last week the wound seems to have come in healthy looking surface using Hydrofera Blue. He was denied for Oasis 2/3; small divot in the right medial lower leg. Under illumination the walls of this divot are epithelialized however the base has slough which I removed with a curette we have been using Hydrofera Blue 2/10 small divot on the right medial lower leg pinpoint illumination at the base of this cone-shaped wound. We have been using Hydrofera Blue but I will switch to calcium alginate this week 2/17; the small divot on the right medial lower leg is fully epithelialized. There is no visible open area under illumination. He has his own stocking for the right leg similar to the one he has been wearing on the left. 03/26/2022; READMISSION This is a now 77 year old man that we had in the clinic from 02/23/2020 through 07/05/2020. At that point he had bilateral lower extremity wounds left greater than right in the setting of severe lymphedema. He had already obtained compression pumps ordered for him I think from the wound care clinic in [facility] so I do  not really have  record of what he has been using. He claims to be using them once a day but there is a problem with the sleeve on the left leg. About 2 weeks ago he was hospitalized from 03/11/2022 through 03/14/2022 with diastolic congestive heart failure. 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 He was diuresed. Developed some prerenal azotemia and he has not been taking any diuretics currently. He has not been putting stockings on his legs since he got out of hospital and still has his legs dependent for long periods. Past medical history history of prostate cancer treated with prostatectomy and radiation this was apparently about 8 years ago, history of DVT on chronic Coumadin, history of lymphedema was managed for a while at the clinic in [place]. History of inguinal hernia repair in September 22, hypertension, stage IIIb chronic renal failure ABIs today were noncompressible on the right 1.12 on the left 04-02-2022 upon evaluation today patient appears to be doing well currently in regard to his legs I do feel like both areas that are draining are actually much drier than they were in the picture last week although the left is drier than the right. He is tolerating the 4-layer compression wraps at this point he did contact the pump company and they are actually working on getting him a new compression sleeve for one of his legs which have previously popped and was not functioning properly. 04-23-2022 upon evaluation today patient appears to be doing well currently in regard to his wounds on the legs. I am actually very pleased with where things stand and I do feel like that we are headed in the right direction. Fortunately there is no sign of active infection locally or systemically at this time. 05-07-2022 upon evaluation today patient appears to be doing well currently in regard to his wounds in fact things are showing signs of improvement which is good news I  do not see too much that actually appears to be open and I am very pleased in that regard. No fevers, chills, nausea, vomiting, or diarrhea. 05-21-2022 upon evaluation today patient appears to be doing somewhat poorly in regard to drainage of his lower extremities bilaterally. The right is greater than left as far as the weeping area. Nonetheless it seems to be getting worse not better. He actually has pitting edema which is at least 2+ to the thighs and I am concerned about the fact that he is may be fluid overloaded in general and that is the reason why we cannot get this under control. I know he is not using his pumps all the time because he actually told the nurse that he was either going to pump or he was going to use his fluid pills but not do both. For that reason I do think that he needs to be really doing both in order to get the fluid out as effectively as possible obviously with the 4-layer compression wraps were doing as much as we can from a compression standpoint but it is really not enough. He tells me that he elevates his leg is much as he can in between pumping and other activity throughout the day. 05-28-2022 upon evaluation today patient appears to be doing better in regard to his wounds although the measurements may be a little bit larger this is a very difficult wound to heal it is very indistinct in a lot of areas. Nonetheless there is can be some  need for sharp debridement in regard to both medial and lateral Polidori, Dejean ([id number]) [id number] Page 4 of 13 legs. Fortunately I see no signs of active infection locally nor systemically at this time. No fevers, chills, nausea, vomiting, or diarrhea. 06-04-2022 upon evaluation today patient appears to be doing poorly in general in regard to the wounds on his legs. He still continues to have a tremendous amount of fluid not just in the lower portion of his leg but to be honest his thigh where he has 2-3+  pitting edema in the thigh as well. Unfortunately I do not know that we will be able to get this healed effectively and keep it healed on the lower extremities unless he gets the overall fluid situation taking and under control. Fortunately I do not see any signs of infection locally nor systemically which is great news. He just seems to be very fluid overloaded. 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 He has been tolerating the dressing changes with the compression wraps without complication. Fortunately I do not see any evidence of infection at this time which is great news. No fevers, chills, nausea, vomiting, or diarrhea. 06-18-2022 upon evaluation today patient appears to be doing well currently in regard to his wounds as far as not looking like they are terribly infected but nonetheless I am concerned about a subacute infection secondary to the fact that he continues to have spreading despite the compression therapy. We actually did do an Unna boot on him last week this is actually the first wrap that actually stayed up everything else has been sliding down quite significantly. Fortunately there does not appear to be any signs of infection systemically at this time. With that being said I do believe that locally there seems to be an issue going on here and again I Ernie Hew do a PCR culture to see what that shows also think that I am going to put him on a broad-spectrum antibiotic, doxycycline to see how that will help as well. He does tell me that coming into the clinic today that he was feeling short of breath like "he was about to have a heart attack" because he was having such a hard time breathing. He says that he told this to Dr. Jodelle Green his cardiologist as well when he was evaluated in the past 1 to 2 weeks. 07-02-2022 upon evaluation today patient appears to be doing poorly  currently in regard to his wound. He has been tolerating the dressing changes. Unfortunately he has not had any compression wraps on for the past week because he was unable to make it in for his appointment last week. With that being said he has a significant amount of drainage he tells me has been using his pumps but despite this in the pumps he still has been draining quite a bit. The drainage is also somewhat purulent unfortunately. We did attempt to get in touch with his cardiologist last week unfortunately we were unable to get up with him I did advise that the patient needs to get in touch with him upon leaving today in order to make sure they know he is on the new antibiotics I am going to send him this will be Levaquin and Augmentin. 07-09-2022 upon evaluation today patient appears to be doing about the same in regard to his legs he may have just a slight amount of improvement with regard to the drainage probably  Keystone topical antibiotics are helping in this regard to some degree. Fortunately there does not appear to be any signs of active infection systemically which is great news. No fevers, chills, nausea, vomiting, or diarrhea. 07-16-2022 upon evaluation today patient appears to be doing well currently in regard to his wound. He has been tolerating the dressing changes without complication. Fortunately there does not appear to be any signs of active infection locally nor systemically at this time. With that being said he cannot keep the wraps up he tells me on the left side he had to cut this down because it got too tight. He has been using his pumps but he is on the right side the wrap actually straight down causing some pushing around the central part of his leg just below the calf I think this is a bigger risk for him that help at this point. I think that we may need to try something different he should be getting his compression socks shortly he tells me they were ordered last  Thursday. 3/6; ; this is a patient who lives in [place]. He has severe bilateral lymphedema. He has compression pumps, we have been using kerlix Ace wrap Keystone. He is changing the dressing. We do not have home health. 08-06-2022 upon evaluation today patient appears to be doing a little better in regard to his wounds in general at this point. Fortunately there does not appear to be any signs of active infection locally nor systemically at this time which is great news and overall I am extremely pleased with where we stand today. 08-13-2022 upon evaluation today patient appears to actually be doing significantly better compared to last week. He actually did go to the hospital I told him that he needed to when he left here and he actually did go. With that being said they actually ended up admitting him he was having shortness of breath and I thought it might be related to congestive heart failure turns out he actually had a pulmonary embolism. Subsequently they were able to get him off of the Coumadin switching over to Eliquis to get things stabilized in that regard they also had them wrapped and got his swelling under control on his legs he actually looks much better pretty much across the board at this point. I am very pleased in that regard. With that being said I am very happy that he finally went that could have been a very dangerous situation. 08-20-2022 upon evaluation today patient appears to be doing well currently in regard to his wound. Has been tolerating the dressing changes without complication. Fortunately there does not appear to be any signs of active infection locally nor systemically at this time. I think his legs are doing better there is some need for sharp debridement today. 08-27-2022 upon evaluation patient is actually making excellent progress. I am actually very pleased with where he stands and I think that he is moving in the right direction. In general I think that we are  looking pretty good at the moment. 09-03-2022 upon evaluation today patient appears to be doing well currently in regard to his wound. He is actually tolerating dressing changes on the left and right leg without complication. Fortunately I do not see any need for debridement of the left leg the right leg I think we probably do some need to perform some debridement here. 09-10-2022 upon evaluation today patient's wounds actually showed signs of improvement in both legs I do not see much is  going require debridement today which was great news. Fortunately I do not see any evidence of infection which I think is also excellent he seems to be using his pumps and doing everything right I am happy about how this is progressing at this point. 09-17-2022 upon evaluation today patient appears to be doing decently well in regard to his wounds. He has been tolerating the dressing changes without complication. Fortunately there does not appear to be any signs of active infection at this time which is good news. 09-24-2022 upon evaluation today patient appears to be doing well currently in regard to his wounds. He has been tolerating the dressing changes without complication. Fortunately there does not appear to be any signs of active infection locally nor systemically which is great news. No fevers, chills, nausea, vomiting, or diarrhea. Electronic Signature(s) Signed: 09/24/2022 3:26:56 PM By: [name] PA-C Entered By: [name] on 09/24/2022 15:26:56 -------------------------------------------------------------------------------- Paring/cutting of benign hyperkeratotic lesion 2-4 Details Patient Name: Date of Service: Pontius, [name] 09/24/2022 2:15 PM Medical Record Number: [id number] Patient Account Number: [id number] Date of Birth/Sex: Treating RN: [birth year] (77 y.o. [name] Primary Care Provider: [name] Other Clinician: Referring Provider: Treating Provider/Extender: [name] in Treatment: 26 Yerian, [name] ([id number]) [id number] Page 5 of 13 Procedure Performed for: Non-Wound Location Performed By: Physician [name], PA Post Procedure Diagnosis Same as Pre-procedure Notes curette size 3 used to remove x2 corns and callouses from right heel. Electronic Signature(s) Signed: 09/24/2022 4:34:38 PM By: [name] PA-C Signed: 09/24/2022 4:57:58 PM By: [name] RN, [name] Entered By: [name] on 09/24/2022 15:07:24 -------------------------------------------------------------------------------- Physical Exam Details Patient Name: Date of Service: Inda, [name] 09/24/2022 2:15 PM Medical Record Number: [id number] Patient Account Number: [id number] Date of Birth/Sex: Treating RN: January 25, 1946 (77 y.o. M) Primary Care Provider: [name] Other Clinician: Referring Provider: Treating Provider/Extender: [name] Weeks in Treatment: 29 Constitutional Well-nourished and well-hydrated in no acute distress. Respiratory normal breathing without difficulty. Psychiatric this patient is able to make decisions and demonstrates good insight into disease process. Alert and Oriented x 3. pleasant and cooperative. Notes Upon inspection patient's wound bed actually showed signs of good granulation and epithelization at this point. Fortunately I do not see any signs of active infection locally nor systemically which is great news and overall I am extremely pleased with where we stand. I do think that he is making good leaps and bounds towards closure I think we can probably discontinue the use of the [facility] topical antibiotics at this point. I think it may just be keeping things too moist which we do not want. Electronic Signature(s) Signed: 09/24/2022 3:27:27 PM By: [name] PA-C Entered By: [name] on 09/24/2022  15:27:27 -------------------------------------------------------------------------------- Physician Orders Details Patient Name: Date of Service: Manley, [name] 09/24/2022 2:15 PM Medical Record Number: [id number] Patient Account Number: [id number] Date of Birth/Sex: Treating RN: July 20, 1945 (77 y.o. [name], [name] Primary Care Provider: [name] Other Clinician: Referring Provider: Treating Provider/Extender: [name] in Treatment: 8 Verbal / Phone Orders: No Diagnosis Coding ICD-10 Coding Code Description [phone] Chronic venous hypertension (idiopathic) with ulcer and inflammation of bilateral lower extremity I89.0 Lymphedema, not elsewhere classified 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 other part of right lower leg with other specified severity Follow-up Appointments ppointment in 1  week. Leonard Schwartz Wednesday 10/01/2022 215pm room 8 Return A Other: - ***Change daily. ***** Hold the topical antibiotics- do not apply. Arvidson, [name] [id number] Page 6 of 13 Anesthetic (In clinic) Topical Lidocaine 5% applied to wound bed Bathing/ Shower/ Hygiene May shower with protection but do not get wound dressing(s) wet. Protect dressing(s) with water repellant cover (for example, large plastic bag) or a cast cover and may then take shower. Edema Control - Lymphedema / SCD / Other Lymphedema Pumps. Use Lymphedema pumps on leg(s) 2-3 times a day for 45-60 minutes. If wearing any wraps or hose, do not remove them. Continue exercising as instructed. - *****pump 3 times a day for an hour each time.***** Elevate legs to the level of the heart or above for 30 minutes daily and/or when sitting for 3-4 times a day throughout the day. - important Avoid standing for long periods of time. Exercise regularly Compression stocking or  Garment 30-40 mm/Hg pressure to: - juxtafit Essentials Long for bilateral lower legs; quantity three juxtafit essentials per limb. Wound Treatment Wound #10 - Lower Leg Wound Laterality: Left, Lateral Cleanser: Soap and Water 1 x Per Day/30 Days Discharge Instructions: May shower and wash wound with dial antibacterial soap and water prior to dressing change. Peri-Wound Care: Sween Lotion (Moisturizing lotion) 1 x Per Day/30 Days Discharge Instructions: Apply moisturizing lotion as directed Prim Dressing: Maxorb Extra Ag+ Alginate Dressing, 4x4.75 (in/in) 1 x Per Day/30 Days ary Discharge Instructions: Apply to wound bed as instructed Secondary Dressing: ABD Pad, 5x9 1 x Per Day/30 Days Discharge Instructions: Apply over primary dressing as directed. Secondary Dressing: Woven Gauze Sponge, Non-Sterile 4x4 in 1 x Per Day/30 Days Discharge Instructions: Apply over primary dressing as directed. Secured With: [employer], 4.5x3.1 (in/yd) 1 x Per Day/30 Days Discharge Instructions: Secure with Kerlix as directed. Secured With: 75M Medipore H Soft Cloth Surgical T ape, 4 x 10 (in/yd) 1 x Per Day/30 Days Discharge Instructions: Secure with tape as directed. Compression Wrap: Juxtafit 30-7mmHg 1 x Per Day/30 Days Discharge Instructions: apply in the morning and remove at night. Wound #6 - Lower Leg Wound Laterality: Right, Medial Cleanser: Soap and Water 1 x Per Day/30 Days Discharge Instructions: May shower and wash wound with dial antibacterial soap and water prior to dressing change. Peri-Wound Care: Sween Lotion (Moisturizing lotion) 1 x Per Day/30 Days Discharge Instructions: Apply moisturizing lotion as directed Prim Dressing: Maxorb Extra Ag+ Alginate Dressing, 4x4.75 (in/in) 1 x Per Day/30 Days ary Discharge Instructions: Apply to wound bed as instructed Secondary Dressing: ABD Pad, 5x9 1 x Per Day/30 Days Discharge Instructions: Apply over primary dressing as  directed. Secondary Dressing: Woven Gauze Sponge, Non-Sterile 4x4 in 1 x Per Day/30 Days Discharge Instructions: Apply over primary dressing as directed. Secured With: [employer], 4.5x3.1 (in/yd) 1 x Per Day/30 Days Discharge Instructions: Secure with Kerlix as directed. Secured With: 75M Medipore H Soft Cloth Surgical T ape, 4 x 10 (in/yd) 1 x Per Day/30 Days Discharge Instructions: Secure with tape as directed. Compression Wrap: Juxtafit 30-57mmHg 1 x Per Day/30 Days Discharge Instructions: apply in the morning and remove at night. Wound #7 - Lower Leg Wound Laterality: Left, Medial Cleanser: Soap and Water 1 x Per Day/30 Days Discharge Instructions: May shower and wash wound with dial antibacterial soap and water prior to dressing change. Peri-Wound Care: Sween Lotion (Moisturizing lotion) 1 x Per Day/30 Days Discharge Instructions: Apply moisturizing lotion as directed Prim Dressing: Maxorb Extra Ag+ Alginate  Dressing, 4x4.75 (in/in) 1 x Per Day/30 Days ary Discharge Instructions: Apply to wound bed as instructed [place], Chaynce ([id number]) [id number] Page 7 of 13 Secondary Dressing: ABD Pad, 5x9 1 x Per Day/30 Days Discharge Instructions: Apply over primary dressing as directed. Secondary Dressing: Woven Gauze Sponge, Non-Sterile 4x4 in 1 x Per Day/30 Days Discharge Instructions: Apply over primary dressing as directed. Secured With: [employer], 4.5x3.1 (in/yd) 1 x Per Day/30 Days Discharge Instructions: Secure with Kerlix as directed. Secured With: 58M Medipore H Soft Cloth Surgical T ape, 4 x 10 (in/yd) 1 x Per Day/30 Days Discharge Instructions: Secure with tape as directed. Compression Wrap: Juxtafit 30-67mmHg 1 x Per Day/30 Days Discharge Instructions: apply in the morning and remove at night. Electronic Signature(s) Signed: 09/24/2022 4:34:38 PM By: [name] PA-C Signed: 09/24/2022 4:57:58 PM By: [name] RN, [name] Entered By:  [name] on 09/24/2022 14:50:50 -------------------------------------------------------------------------------- Problem List Details Patient Name: Date of Service: [name], [name] 09/24/2022 2:15 PM Medical Record Number: [id number] Patient Account Number: [id number] Date of Birth/Sex: Treating RN: 02-24-1946 (77 y.o. M) Primary Care Provider: [name] Other Clinician: Referring Provider: Treating Provider/Extender: [name] in Treatment: 26 Active Problems ICD-10 Encounter Code Description Active Date MDM Diagnosis I87.333 Chronic venous hypertension (idiopathic) with ulcer and inflammation of 06/11/2022 No Yes bilateral lower extremity I89.0 Lymphedema, not elsewhere classified 03/26/2022 No Yes L97.828 Non-pressure chronic ulcer of other part of left lower leg with other specified 03/26/2022 No Yes severity 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 other part of right lower leg with other specified 03/26/2022 No Yes severity Inactive Problems Resolved Problems Electronic Signature(s) Signed: 09/24/2022 2:19:23 PM By: [name] PA-C Entered By: [name] on 09/24/2022 14:19:23 -------------------------------------------------------------------------------- Progress Note Details Patient Name: Date of Service: [name], [name] 09/24/2022 2:15 PM Medical Record Number: [id number] Patient Account Number: [id number] Krahenbuhl, [name] ([id number]) [id number] Page 8 of 13 Date of Birth/Sex: Treating RN: [birth year] (77 y.o. M) Primary Care Provider: Other Clinician: [name] Referring Provider: Treating Provider/Extender: Leveda Anna, [name] Weeks in Treatment: 44 Subjective Chief Complaint Information obtained from Patient 02/23/2020; patient is here for wounds on his bilateral lower legs in the setting of severe lymphedema 03/26/2022; patient is here for wounds on his bilateral lower legs medial  aspect History of Present Illness (HPI) ADMISSION 02/23/2020 Patient is a 77 year old man who lives in [place] who arrives accompanied by his wife. He has a history of chronic lymphedema and venous insufficiency in his bilateral lower legs which may have something to do that with having a history of DVT as well as being treated for prostate cancer. In any case he recently got compression pumps at home but compliance has been an issue here. He has compression stockings however they are probably not sufficient enough to control swelling. They tell us that things deteriorated for him in late August he was admitted to [facility] for 7 days. This was with cellulitis I think of his bilateral lower legs. Discharge he was noted to have wounds on his bilateral lower legs. He was discharged on Bactrim. They tried to get him home health through [facility] of course they declined him. His wife is been wrapping these applying some form of silver foam dressing. He has a history of wounds before although nothing that would not heal with basic home topical dressings. He has 2 areas on the left medial, left anterior and left lateral  and a smaller area on the right medial. All of these have considerable depth. Past medical history includes iron deficiency anemia, lymphedema followed by the rehab center at [facility] with lymphedema wraps I believe, DVT on chronic anticoagulation, prostate cancer, chronic venous insufficiency, hypertension. As mentioned he has compression pumps but does not use them. ABIs in our clinic were noncompressible bilaterally 10/14; patient with severe bilateral lymphedema right greater than left. He came in with bilateral lower extremity wounds left greater than right. Even though the right side has more of the edema most of the wounds here almost closed on the right medial. He has 3 remaining wounds on the left We have been using silver alginate under 4-layer  compression I have been trying to get him to be compliant with his external compression pumps 10/21; patient with 3 small wounds on the left leg and 1 on the right medial in the setting of severe lymphedema and chronic venous insufficiency. We have been using silver alginate under 4-layer compression he is using his external compression pumps twice a day 11/4; ARTERIAL STUDIES on the right show an ABI of 1.02 TBI of 0.858 with biphasic waveforms on the left 0.98 with a TBI of 0.55 and biphasic waveforms. Does not look like he has significant arterial disease. We are treating him for lymphedema he has compression pumps. He has punched-out areas on the left anterior left lateral and right medial lower extremities 11/11; after we obtained his arterial studies I put him in 4 layer compression. He is using his compression pumps probably once a day although I have asked him to do twice. Primary dressing to the wound is silver collagen he has severe lymphedema likely secondary to chronic venous insufficiency. Wounds on the left lateral, left medial and left anterior and a small area on the right medial 12/2; the area on the right anterior lower leg has healed. We initially thought that the area medially had healed as well however when her discharge nurse came in she detected fluid in the wound simply opened up. This is actually worse than I remember this pain. The area on the left lateral potentially slightly smaller He is also complaining about pain in his left hand he says that this is actually been getting some better he has been using topical creams on this. She asked that I look at this 12/9 after last weeks issues we have 2 wounds one on the right medial lower leg and 1 on the left lateral. Both of these are in the same condition. I think because of thickened skin secondary to chronic lymphedema these wounds actually have depth of almost 0.8 cm. 12/16; the patient has 2 small but deep wounds one on  the right medial and one on the left lateral. The right medial is actually the worst of these. He arrives in clinic today with absolutely terrible edema in the right leg apparently his 4-layer wrap fell down to just above his ankle he did not think about this he is apparently been continuing to use his compression pump twice a day. The left leg looks a lot better. 05/09/2020 upon evaluation today patient appears to be doing decently well in regard to his wounds. Everything is measuring smaller the right leg still has a little bit deeper wound in the left seems to be almost completely healed in my opinion I am very pleased in general with how things are progressing. He has a 4- layer compression wrap we have been using endoform  today we will probably have to use collagen just based on the fact that we do not have endoform it is on order. 1/6; the patient's wound on the left lateral lower leg has healed. Still has 1 on the right medial. 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 Using compression pumps at home twice a day. 1/13; left lateral lower leg is still healed. He has a deep punched out rectangular shaped wound on the right medial calf. Looking down at this it appears that he is attempting to epithelialize around the edges of the wound and on the base as well. His edema is reasonably well controlled we have been using collagen with absolutely no effect 1/20; left lateral lower leg remains closed he has extremitease stockings. The area on the right medial calf I aggressively debrided last week measures larger but the surface looks better. We have been using Hydrofera Blue. We ran Oasis through his insurance but we have not seen the results of this 1/27; left lower leg wound with chronic venous insufficiency and secondary lymphedema. I did aggressive debridement on this last week the wound seems to have come in healthy looking surface using Hydrofera Blue. He was denied for  Oasis 2/3; small divot in the right medial lower leg. Under illumination the walls of this divot are epithelialized however the base has slough which I removed with a curette we have been using Hydrofera Blue 2/10 small divot on the right medial lower leg pinpoint illumination at the base of this cone-shaped wound. We have been using Hydrofera Blue but I will switch to calcium alginate this week 2/17; the small divot on the right medial lower leg is fully epithelialized. There is no visible open area under illumination. He has his own stocking for the right leg similar to the one he has been wearing on the left. 03/26/2022; READMISSION This is a now 77 year old man that we had in the clinic from 02/23/2020 through 07/05/2020. At that point he had bilateral lower extremity wounds left greater than right in the setting of severe lymphedema. He had already obtained compression pumps ordered for him I think from the wound care clinic in [facility] so I do not really have record of what he has been using. He claims to be using them once a day but there is a problem with the sleeve on the left leg. About 2 weeks ago he was hospitalized from 03/11/2022 through 03/14/2022 with diastolic congestive heart failure. 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 He was diuresed. Developed some prerenal azotemia and he has not been taking any diuretics currently. He has not been putting stockings on his legs since he got out of hospital and still has his legs dependent for long periods. Ratterman, [name] ([id number]) [id number] Page 9 of 13 Past medical history history of prostate cancer treated with prostatectomy and radiation this was apparently about 8 years ago, history of DVT on chronic Coumadin, history of lymphedema was managed for a while at the clinic in [place]. History of inguinal hernia repair in September 22, hypertension,  stage IIIb chronic renal failure ABIs today were noncompressible on the right 1.12 on the left 04-02-2022 upon evaluation today patient appears to be doing well currently in regard to his legs I do feel like both areas that are draining are actually much drier than they were in the picture last week although the left is drier than the right. He is  tolerating the 4-layer compression wraps at this point he did contact the pump company and they are actually working on getting him a new compression sleeve for one of his legs which have previously popped and was not functioning properly. 04-23-2022 upon evaluation today patient appears to be doing well currently in regard to his wounds on the legs. I am actually very pleased with where things stand and I do feel like that we are headed in the right direction. Fortunately there is no sign of active infection locally or systemically at this time. 05-07-2022 upon evaluation today patient appears to be doing well currently in regard to his wounds in fact things are showing signs of improvement which is good news I do not see too much that actually appears to be open and I am very pleased in that regard. No fevers, chills, nausea, vomiting, or diarrhea. 05-21-2022 upon evaluation today patient appears to be doing somewhat poorly in regard to drainage of his lower extremities bilaterally. The right is greater than left as far as the weeping area. Nonetheless it seems to be getting worse not better. He actually has pitting edema which is at least 2+ to the thighs and I am concerned about the fact that he is may be fluid overloaded in general and that is the reason why we cannot get this under control. I know he is not using his pumps all the time because he actually told the nurse that he was either going to pump or he was going to use his fluid pills but not do both. For that reason I do think that he needs to be really doing both in order to get the fluid out as  effectively as possible obviously with the 4-layer compression wraps were doing as much as we can from a compression standpoint but it is really not enough. He tells me that he elevates his leg is much as he can in between pumping and other activity throughout the day. 05-28-2022 upon evaluation today patient appears to be doing better in regard to his wounds although the measurements may be a little bit larger this is a very difficult wound to heal it is very indistinct in a lot of areas. Nonetheless there is can be some need for sharp debridement in regard to both medial and lateral legs. Fortunately I see no signs of active infection locally nor systemically at this time. No fevers, chills, nausea, vomiting, or diarrhea. 06-04-2022 upon evaluation today patient appears to be doing poorly in general in regard to the wounds on his legs. He still continues to have a tremendous amount of fluid not just in the lower portion of his leg but to be honest his thigh where he has 2-3+ pitting edema in the thigh as well. Unfortunately I do not know that we will be able to get this healed effectively and keep it healed on the lower extremities unless he gets the overall fluid situation taking and under control. Fortunately I do not see any signs of infection locally nor systemically which is great news. He just seems to be very fluid overloaded. 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 He has been tolerating the dressing changes with the compression wraps without complication. Fortunately I do not see any evidence of infection at this time which is great news. No fevers, chills, nausea, vomiting, or diarrhea. 06-18-2022 upon evaluation today patient appears to be doing well currently  in regard to his wounds as far as not looking like they are terribly infected but nonetheless I am concerned about a  subacute infection secondary to the fact that he continues to have spreading despite the compression therapy. We actually did do an Unna boot on him last week this is actually the first wrap that actually stayed up everything else has been sliding down quite significantly. Fortunately there does not appear to be any signs of infection systemically at this time. With that being said I do believe that locally there seems to be an issue going on here and again I Ernie Hew do a PCR culture to see what that shows also think that I am going to put him on a broad-spectrum antibiotic, doxycycline to see how that will help as well. He does tell me that coming into the clinic today that he was feeling short of breath like "he was about to have a heart attack" because he was having such a hard time breathing. He says that he told this to Dr. Jodelle Green his cardiologist as well when he was evaluated in the past 1 to 2 weeks. 07-02-2022 upon evaluation today patient appears to be doing poorly currently in regard to his wound. He has been tolerating the dressing changes. Unfortunately he has not had any compression wraps on for the past week because he was unable to make it in for his appointment last week. With that being said he has a significant amount of drainage he tells me has been using his pumps but despite this in the pumps he still has been draining quite a bit. The drainage is also somewhat purulent unfortunately. We did attempt to get in touch with his cardiologist last week unfortunately we were unable to get up with him I did advise that the patient needs to get in touch with him upon leaving today in order to make sure they know he is on the new antibiotics I am going to send him this will be Levaquin and Augmentin. 07-09-2022 upon evaluation today patient appears to be doing about the same in regard to his legs he may have just a slight amount of improvement with regard to the drainage probably Keystone  topical antibiotics are helping in this regard to some degree. Fortunately there does not appear to be any signs of active infection systemically which is great news. No fevers, chills, nausea, vomiting, or diarrhea. 07-16-2022 upon evaluation today patient appears to be doing well currently in regard to his wound. He has been tolerating the dressing changes without complication. Fortunately there does not appear to be any signs of active infection locally nor systemically at this time. With that being said he cannot keep the wraps up he tells me on the left side he had to cut this down because it got too tight. He has been using his pumps but he is on the right side the wrap actually straight down causing some pushing around the central part of his leg just below the calf I think this is a bigger risk for him that help at this point. I think that we may need to try something different he should be getting his compression socks shortly he tells me they were ordered last Thursday. 3/6; ; this is a patient who lives in [place]. He has severe bilateral lymphedema. He has compression pumps, we have been using kerlix Ace wrap Keystone. He is changing the dressing. We do not have home health. 08-06-2022 upon evaluation today patient  appears to be doing a little better in regard to his wounds in general at this point. Fortunately there does not appear to be any signs of active infection locally nor systemically at this time which is great news and overall I am extremely pleased with where we stand today. 08-13-2022 upon evaluation today patient appears to actually be doing significantly better compared to last week. He actually did go to the hospital I told him that he needed to when he left here and he actually did go. 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 Subsequently they were able to get him off of the Coumadin switching over to Eliquis to get things stabilized in that regard they also had them wrapped and got his swelling under control on his legs he actually looks much better pretty much across the board at this point. I am very pleased in that regard. With that being said I am very happy that he finally went that could have been a very dangerous situation. 08-20-2022 upon evaluation today patient appears to be doing well currently in regard to his wound. Has been tolerating the dressing changes without complication. Fortunately there does not appear to be any signs of active infection locally nor systemically at this time. I think his legs are doing better there is some need for sharp debridement today. 08-27-2022 upon evaluation patient is actually making excellent progress. I am actually very pleased with where he stands and I think that he is moving in the right direction. In general I think that we are looking pretty good at the moment. 09-03-2022 upon evaluation today patient appears to be doing well currently in regard to his wound. He is actually tolerating dressing changes on the left and right leg without complication. Fortunately I do not see any need for debridement of the left leg the right leg I think we probably do some need to perform some debridement here. 09-10-2022 upon evaluation today patient's wounds actually showed signs of improvement in both legs I do not see much is going require debridement today which was great news. Fortunately I do not see any evidence of infection which I think is also excellent he seems to be using his pumps and doing everything right I am happy about how this is progressing at this point. Demario, [name] [id number] Page 10 of 13 09-17-2022 upon evaluation today patient appears to be doing decently well in regard to his wounds. He has been tolerating the  dressing changes without complication. Fortunately there does not appear to be any signs of active infection at this time which is good news. 09-24-2022 upon evaluation today patient appears to be doing well currently in regard to his wounds. He has been tolerating the dressing changes without complication. Fortunately there does not appear to be any signs of active infection locally nor systemically which is great news. No fevers, chills, nausea, vomiting, or diarrhea. Objective Constitutional Well-nourished and well-hydrated in no acute distress. Vitals Time Taken: 2:31 PM, Height: 74 in, Weight: 250 lbs, BMI: 32.1, Temperature: 98.1 F, Pulse: 90 bpm, Respiratory Rate: 20 breaths/min, Blood Pressure: 175/72 mmHg. Respiratory normal breathing without difficulty. Psychiatric this patient is able to make decisions and demonstrates good insight into disease process. Alert and Oriented x 3. pleasant and cooperative. General Notes: Upon inspection patient's wound bed actually showed  signs of good granulation and epithelization at this point. Fortunately I do not see any signs of active infection locally nor systemically which is great news and overall I am extremely pleased with where we stand. I do think that he is making good leaps and bounds towards closure I think we can probably discontinue the use of the [facility] topical antibiotics at this point. I think it may just be keeping things too moist which we do not want. Integumentary (Hair, Skin) Wound #10 status is Open. Original cause of wound was Gradually Appeared. The date acquired was: 09/16/2022. The wound has been in treatment 1 weeks. The wound is located on the Left,Lateral Lower Leg. The wound measures 0.3cm length x 1cm width x 0.1cm depth; 0.236cm^2 area and 0.024cm^3 volume. There is Fat Layer (Subcutaneous Tissue) exposed. There is no tunneling or undermining noted. There is a medium amount of serosanguineous drainage noted. The  wound margin is distinct with the outline attached to the wound base. There is no granulation within the wound bed. There is a large (67-100%) amount of necrotic tissue within the wound bed including Adherent Slough. The periwound skin appearance exhibited: Maceration. The periwound skin appearance did not exhibit: Callus, Crepitus, Excoriation, Induration, Rash, Scarring, Dry/Scaly, Atrophie Blanche, Cyanosis, Ecchymosis, Hemosiderin Staining, Mottled, Pallor, Rubor, Erythema. Wound #6 status is Open. Original cause of wound was Gradually Appeared. The date acquired was: 03/05/2022. The wound has been in treatment 26 weeks. The wound is located on the Right,Medial Lower Leg. The wound measures 9cm length x 5cm width x 0.4cm depth; 35.343cm^2 area and 14.137cm^3 volume. There is Fat Layer (Subcutaneous Tissue) exposed. There is no tunneling or undermining noted. There is a medium amount of serosanguineous drainage noted. The wound margin is distinct with the outline attached to the wound base. There is no granulation within the wound bed. There is a large (67-100%) amount of necrotic tissue within the wound bed including Adherent Slough. The periwound skin appearance exhibited: Scarring, Hemosiderin Staining. The periwound skin appearance did not exhibit: Callus, Crepitus, Excoriation, Induration, Rash, Dry/Scaly, Maceration, Atrophie Blanche, Cyanosis, Ecchymosis, Mottled, Pallor, Rubor, Erythema. Wound #6 status is Open. Original cause of wound was Gradually Appeared. The date acquired was: 03/05/2022. The wound has been in treatment 26 weeks. The wound is located on the Right,Medial Lower Leg. The wound measures 9cm length x 5cm width x 0.3cm depth; 35.343cm^2 area and 10.603cm^3 volume. There is Fat Layer (Subcutaneous Tissue) exposed. There is a medium amount of serosanguineous drainage noted. The wound margin is distinct with the outline attached to the wound base. There is small (1-33%) pink  granulation within the wound bed. There is a large (67-100%) amount of necrotic tissue within the wound bed including Adherent Slough. The periwound skin appearance exhibited: Scarring, Hemosiderin Staining. The periwound skin appearance did not exhibit: Callus, Crepitus, Excoriation, Induration, Rash, Dry/Scaly, Maceration, Atrophie Blanche, Cyanosis, Ecchymosis, Mottled, Pallor, Rubor, Erythema. Wound #7 status is Open. Original cause of wound was Gradually Appeared. The date acquired was: 03/05/2022. The wound has been in treatment 26 weeks. The wound is located on the Left,Medial Lower Leg. The wound measures 2.9cm length x 3.5cm width x 0.1cm depth; 7.972cm^2 area and 0.797cm^3 volume. There is Fat Layer (Subcutaneous Tissue) exposed. There is no tunneling or undermining noted. There is a medium amount of serosanguineous drainage noted. The wound margin is distinct with the outline attached to the wound base. There is small (1-33%) red, pink granulation within the wound bed. There is a  large (67- 100%) amount of necrotic tissue within the wound bed including Adherent Slough. The periwound skin appearance exhibited: Scarring, Maceration, Hemosiderin Staining. The periwound skin appearance did not exhibit: Callus, Crepitus, Excoriation, Induration, Rash, Dry/Scaly, Atrophie Blanche, Cyanosis, Ecchymosis, Mottled, Pallor, Rubor, Erythema. 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 There was a Excisional  Skin/Subcutaneous Tissue Debridement with a total area of 25.12 sq cm performed by [name], PA. With the following instrument(s): Curette to remove Viable and Non-Viable tissue/material. Material removed includes Subcutaneous Tissue, Slough, and Biofilm after achieving pain control using Lidocaine 4% Topical Solution. A time out was conducted at 14:45, prior to the start of the procedure. A Minimum amount of bleeding was controlled with Pressure. The procedure was tolerated well with a pain level of 0 throughout and a pain level of 0 following the procedure. Post Debridement Measurements: 8cm length x 4cm width x 0.4cm depth; 10.053cm^3 volume. Character of Wound/Ulcer Post Debridement is improved. Post procedure Diagnosis Wound #6: Same as Pre-Procedure Wound #7 Pre-procedure diagnosis of Wound #7 is a Lymphedema located on the Left,Medial Lower Leg . There was a Excisional Skin/Subcutaneous Tissue Debridement with a total area of 7.97 sq cm performed by [name], PA. With the following instrument(s): Curette to remove Viable and Non-Viable tissue/material. Material removed includes Subcutaneous Tissue, Slough, and Biofilm after achieving pain control using Lidocaine 4% T opical Solution. A time out was conducted at 14:45, prior to the start of the procedure. A Minimum amount of bleeding was controlled with Pressure. The procedure was tolerated well with a pain level of 0 throughout and a pain level of 0 following the procedure. Post Debridement Measurements: 2.9cm length x 3.5cm width x 0.1cm depth; 0.797cm^3 volume. Character of Wound/Ulcer Post Debridement is improved. Post procedure Diagnosis Wound #7: Same as Pre-Procedure A Paring/cutting of benign hyperkeratotic lesion 2-4 procedure was performed. by [name], PA. Post procedure Diagnosis Wound #: Same as Pre-Procedure Notes: curette size 3 used to remove x2 corns and callouses from right heel. Plan Follow-up  Appointments: Return Appointment in 1 week. Leonard Schwartz Wednesday 10/01/2022 215pm room 8 Other: - ***Change daily. ***** Hold the topical antibiotics- do not apply. Anesthetic: (In clinic) Topical Lidocaine 5% applied to wound bed Bathing/ Shower/ Hygiene: May shower with protection but do not get wound dressing(s) wet. Protect dressing(s) with water repellant cover (for example, large plastic bag) or a cast cover and may then take shower. Edema Control - Lymphedema / SCD / Other: Lymphedema Pumps. Use Lymphedema pumps on leg(s) 2-3 times a day for 45-60 minutes. If wearing any wraps or hose, do not remove them. Continue exercising as instructed. - *****pump 3 times a day for an hour each time.***** Elevate legs to the level of the heart or above for 30 minutes daily and/or when sitting for 3-4 times a day throughout the day. - important Avoid standing for long periods of time. Exercise regularly Compression stocking or Garment 30-40 mm/Hg pressure to: - juxtafit Essentials Long for bilateral lower legs; quantity three juxtafit essentials per limb. WOUND #10: - Lower Leg Wound Laterality:  Left, Lateral Cleanser: Soap and Water 1 x Per Day/30 Days Discharge Instructions: May shower and wash wound with dial antibacterial soap and water prior to dressing change. Peri-Wound Care: Sween Lotion (Moisturizing lotion) 1 x Per Day/30 Days Discharge Instructions: Apply moisturizing lotion as directed Prim Dressing: Maxorb Extra Ag+ Alginate Dressing, 4x4.75 (in/in) 1 x Per Day/30 Days ary Discharge Instructions: Apply to wound bed as instructed Secondary Dressing: ABD Pad, 5x9 1 x Per Day/30 Days Discharge Instructions: Apply over primary dressing as directed. Secondary Dressing: Woven Gauze Sponge, Non-Sterile 4x4 in 1 x Per Day/30 Days Discharge Instructions: Apply over primary dressing as directed. Secured With: [employer], 4.5x3.1 (in/yd) 1 x Per Day/30 Days Discharge Instructions: Secure  with Kerlix as directed. Secured With: 17M Medipore H Soft Cloth Surgical T ape, 4 x 10 (in/yd) 1 x Per Day/30 Days Discharge Instructions: Secure with tape as directed. Com pression Wrap: Juxtafit 30-62mmHg 1 x Per Day/30 Days Discharge Instructions: apply in the morning and remove at night. WOUND #6: - Lower Leg Wound Laterality: Right, Medial Cleanser: Soap and Water 1 x Per Day/30 Days Discharge Instructions: May shower and wash wound with dial antibacterial soap and water prior to dressing change. Peri-Wound Care: Sween Lotion (Moisturizing lotion) 1 x Per Day/30 Days Discharge Instructions: Apply moisturizing lotion as directed Prim Dressing: Maxorb Extra Ag+ Alginate Dressing, 4x4.75 (in/in) 1 x Per Day/30 Days ary Discharge Instructions: Apply to wound bed as instructed Secondary Dressing: ABD Pad, 5x9 1 x Per Day/30 Days Discharge Instructions: Apply over primary dressing as directed. Secondary Dressing: Woven Gauze Sponge, Non-Sterile 4x4 in 1 x Per Day/30 Days Discharge Instructions: Apply over primary dressing as directed. Secured With: [employer], 4.5x3.1 (in/yd) 1 x Per Day/30 Days Discharge Instructions: Secure with Kerlix as directed. Secured With: 17M Medipore H Soft Cloth Surgical T ape, 4 x 10 (in/yd) 1 x Per Day/30 Days Discharge Instructions: Secure with tape as directed. Com pression Wrap: Juxtafit 30-35mmHg 1 x Per Day/30 Days Discharge Instructions: apply in the morning and remove at night. WOUND #7: - Lower Leg Wound Laterality: Left, Medial Cleanser: Soap and Water 1 x Per Day/30 Days Discharge Instructions: May shower and wash wound with dial antibacterial soap and water prior to dressing change. Peri-Wound Care: Sween Lotion (Moisturizing lotion) 1 x Per Day/30 Days Discharge Instructions: Apply moisturizing lotion as directed Prim Dressing: Maxorb Extra Ag+ Alginate Dressing, 4x4.75 (in/in) 1 x Per Day/30 Days ary Discharge Instructions: Apply to  wound bed as instructed Secondary Dressing: ABD Pad, 5x9 1 x Per Day/30 Days Discharge Instructions: Apply over primary dressing as directed. Zynda, [name] [id number] Page 12 of 13 Secondary Dressing: Woven Gauze Sponge, Non-Sterile 4x4 in 1 x Per Day/30 Days Discharge Instructions: Apply over primary dressing as directed. Secured With: [employer], 4.5x3.1 (in/yd) 1 x Per Day/30 Days Discharge Instructions: Secure with Kerlix as directed. Secured With: 17M Medipore H Soft Cloth Surgical T ape, 4 x 10 (in/yd) 1 x Per Day/30 Days Discharge Instructions: Secure with tape as directed. Compression Wrap: Juxtafit 30-98mmHg 1 x Per Day/30 Days Discharge Instructions: apply in the morning and remove at night. 1. Would recommend currently that we have the patient continue to monitor for any signs of infection or worsening. Based on what I am seeing I do believe that we are moving in the right direction. 2. I am also going to suggest that the patient should continue with the juxta fit compression wraps along with the silver  alginate dressing. 3. I did also perform some callus paring in regard to the plantar surface of the patient's right foot. There were 2 sites that actually did remove the callus from because there were areas that were seeds underneath that were pressing on his heel causing it to be painful when he walked we will got these off he actually was significantly improved. We will see patient back for reevaluation in 1 week here in the clinic. If anything worsens or changes patient will contact our office for additional recommendations. Electronic Signature(s) Signed: 09/24/2022 3:28:25 PM By: [name] PA-C Entered By: [name] on 09/24/2022 15:28:25 -------------------------------------------------------------------------------- SuperBill Details Patient Name: Date of Service: [name], [name] 09/24/2022 Medical Record Number:  [id number] Patient Account Number: [id number] Date of Birth/Sex: Treating RN: [birth year] (77 y.o. [name], [name] Primary Care Provider: [name] Other Clinician: Referring Provider: Treating Provider/Extender: [name] Weeks in Treatment: 26 Diagnosis Coding ICD-10 Codes Code Description [phone] Chronic venous hypertension (idiopathic) with ulcer and inflammation of bilateral lower extremity I89.0 Lymphedema, not elsewhere classified 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 other part of right lower leg with other specified severity L84 Corns and callosities Facility Procedures : CPT4 Code Description: [id number] 11042 - DEB SUBQ TISSUE 20 SQ CM/< ICD-10 Diagnosis Description 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 other part of right  lower leg with other specified severity Modifier: Quantity: 1 : CPT4 Code Description: [id number] 11045 - DEB SUBQ TISS EA ADDL 20CM ICD-10 Diagnosis Description 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 other part of right  lower leg with other specified severity Modifier: Quantity: 1 : CPT4 Code Description: [id number] 11106-Incisional biopsy of skin (e.g., wedge) (including simple closure, when performed) single lesion ICD-10 Diagnosis Description L84 Corns and callosities Modifier: Quantity: 1 Physician Procedures : CPT4 Code Description Modifier [id number] 11042 - WC PHYS SUBQ TISS 20 SQ CM ICD-10 Diagnosis Description 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 Quantity: 1 : [id number] 11045 - WC  PHYS SUBQ TISS EA ADDL 20 CM Prindiville, Ihor ([id number]) [id number] ICD-10 Diagnosis Description 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.818  Non-pressure chronic ulcer of other part of right lower leg with other specified severity Quantity: 1 Page 13 of 13 : 11106 Incisional biopsy of skin (e.g., wedge) (including simple closure, when performed) single lesion 1 ICD-10 Diagnosis Description L84 Corns and callosities Quantity: Electronic Signature(s) Signed: 09/24/2022 3:29:27 PM By: [name] PA-C Previous Signature: 09/24/2022 3:08:00 PM Version By: [name] RN, [name] Entered By: [name] on 09/24/2022 15:29:27

## 2022-09-25 NOTE — Progress Notes (Signed)
[name], [name] ([id number]) [id number] Page 1 of 9 Visit Report for 09/24/2022 Arrival Information Details Patient Name: Date of Service: [name], [name] 09/24/2022 2:15 PM Medical Record Number: [id number] Patient Account Number: [id number] Date of Birth/Sex: Treating RN: [name] (77 y.o. [name] Primary Care [name]: [name] Other Clinician: Referring [name]: Treating [name]: [name] in Treatment: 26 Visit Information History Since Last Visit Added or deleted any medications: No Patient Arrived: [place] Any new allergies or adverse reactions: No Arrival Time: 14:19 Had a fall or experienced change in No Accompanied By: wife activities of daily living that may affect Transfer Assistance: None risk of falls: Patient Identification Verified: Yes Signs or symptoms of abuse/neglect since last visito No Secondary Verification Process Completed: Yes Hospitalized since last visit: No Patient Requires Transmission-Based Precautions: No Implantable device outside of the clinic excluding No Patient Has Alerts: Yes cellular tissue based products placed in the center Patient Alerts: Patient on Blood Thinner since last visit: Right ABI in clinic [place] Has Dressing in Place as Prescribed: Yes Has Compression in Place as Prescribed: Yes Pain Present Now: No Electronic Signature(s) Signed: 09/24/2022 4:57:58 PM By: [name] Stall RN, [name] Entered By: [name] on 09/24/2022 14:19:21 -------------------------------------------------------------------------------- Encounter Discharge Information Details Patient Name: Date of Service: [name], [name] 09/24/2022 2:15 PM Medical Record Number: [id number] Patient Account Number: [id number] Date of Birth/Sex: Treating RN: [name] (76 y.o. [name] Primary Care Devontre Siedschlag: [name] Other Clinician: Referring [name]: Treating Lawerance Matsuo/Extender:  [name] Weeks in Treatment: 26 Encounter Discharge Information Items Post Procedure Vitals Discharge Condition: Stable Temperature (F): 98.1 Ambulatory Status: Ambulatory Pulse (bpm): 90 Discharge Destination: Home Respiratory Rate (breaths/min): 20 Transportation: Private Auto Blood Pressure (mmHg): 175/72 Accompanied By: wife Schedule Follow-up Appointment: Yes Clinical Summary of Care: Electronic Signature(s) Signed: 09/24/2022 4:57:58 PM By: [name] Stall RN, [name] Entered By: [name] on 09/24/2022 14:52:33 -------------------------------------------------------------------------------- Lower Extremity Assessment Details Patient Name: Date of Service: [name], [name] 09/24/2022 2:15 PM Medical Record Number: [id number] Patient Account Number: [id number] Date of Birth/Sex: Treating RN: [name] (77 y.o. [name] Primary Care Jinna Weinman: [name] Other Clinician: Referring [name]: Treating [name]/Extender: [name] Weeks in Treatment: 26 Edema Assessment J[Left: [name], [name] ([id number])] [name]: [id number] Page 2 of 9] Assessed: [Left: Yes] [Right: Yes] Edema: [Left: Yes] [Right: Yes] Calf Left: Right: Point of Measurement: 40 cm From Medial Instep 53 cm 62 cm Ankle Left: Right: Point of Measurement: 13 cm From Medial Instep 42 cm 42 cm Vascular Assessment Pulses: Dorsalis Pedis Palpable: [Left:Yes] [Right:Yes] Electronic Signature(s) Signed: 09/24/2022 4:57:58 PM By: [name] Stall RN, [name] Entered By: [name] on 09/24/2022 14:25:40 -------------------------------------------------------------------------------- Multi-Disciplinary Care Plan Details Patient Name: Date of Service: [name], [name] 09/24/2022 2:15 PM Medical Record Number: [id number] Patient Account Number: [id number] Date of Birth/Sex: Treating RN: [name] (77 y.o. [name] Primary Care  Lamonta Cypress: [name] Other Clinician: Referring [name]: Treating [name]: [name] Weeks in Treatment: 26 Active Inactive Wound/Skin Impairment Nursing Diagnoses: Impaired tissue integrity Knowledge deficit related to ulceration/compromised skin integrity Goals: Patient will have a decrease in wound volume by X% from date: (specify in notes) Date Initiated: 03/26/2022 Target Resolution Date: 11/14/2022 Goal Status: Active Patient/caregiver will verbalize understanding of skin care regimen Date Initiated: 03/26/2022 Target Resolution Date: 11/14/2022 Goal Status: Active Ulcer/skin breakdown will have a volume reduction of 30% by week 4 Date  Initiated: 03/26/2022 Date Inactivated: 05/21/2022 Target Resolution Date: 05/17/2022 Unmet Reason: see wound Goal Status: Unmet measurement. Ulcer/skin breakdown will have a volume reduction of 50% by week 8 Date Initiated: 03/26/2022 Date Inactivated: 05/21/2022 Target Resolution Date: 05/17/2022 Unmet Reason: see wound Goal Status: Unmet measurement. Interventions: Assess patient/caregiver ability to obtain necessary supplies Assess patient/caregiver ability to perform ulcer/skin care regimen upon admission and as needed Assess ulceration(s) every visit Notes: Patient stated today, "I will take my fluid pill or pump not do both." [name] made aware. Electronic Signature(s) Signed: 09/24/2022 4:57:58 PM By: [name] Stall RN, [name] Entered By: [name] on 09/24/2022 14:31:30 [name], [name] ([id number]) [id number] Page 3 of 9 -------------------------------------------------------------------------------- Pain Assessment Details Patient Name: Date of Service: [name] 09/24/2022 2:15 PM Medical Record Number: [id number] Patient Account Number: [id number] Date of Birth/Sex: Treating RN: [name] (77 y.o. [name], Millard.Loa Primary Care [name]: [name] Other  Clinician: Referring [name]: Treating [name]: [name] Weeks in Treatment: 26 Active Problems Location of Pain Severity and Description of Pain Patient Has Paino No Site Locations Pain Management and Medication Current Pain Management: Medication: No Cold Application: No Rest: No Massage: No Activity: No T.E.N.S.: No Heat Application: No Leg drop or elevation: No Is the Current Pain Management Adequate: Adequate How does your wound impact your activities of daily livingo Sleep: No Bathing: No Appetite: No Relationship With Others: No Bladder Continence: No Emotions: No Bowel Continence: No Work: No Toileting: No Drive: No Dressing: No Hobbies: No Electronic Signature(s) Signed: 09/24/2022 4:57:58 PM By: [name] Stall RN, [name] Entered By: [name] on 09/24/2022 14:19:29 -------------------------------------------------------------------------------- Patient/Caregiver Education Details Patient Name: Date of Service: [name], [name] 5/8/2024andnbsp2:15 PM Medical Record Number: [id number] Patient Account Number: [id number] Date of Birth/Gender: Treating RN: [birth year] [name] (77 y.o. [name] Primary Care Physician: [name] Other Clinician: Referring Physician: Treating Physician/Extender: [name] in Treatment: 85 Education Assessment Education Provided To: Patient Education Topics Provided Wound/Skin Impairment: Warzecha, [name] ([id number]) [id number] Page 4 of 9 Handouts: Caring for Your Ulcer Methods: Explain/Verbal Responses: Reinforcements needed Electronic Signature(s) Signed: 09/24/2022 4:57:58 PM By: [name] Stall RN, [name] Entered By: [name] on 09/24/2022 14:31:44 -------------------------------------------------------------------------------- Wound Assessment Details Patient Name: Date of Service: [name], [name] 09/24/2022 2:15 PM Medical  Record Number: [id number] Patient Account Number: [id number] Date of Birth/Sex: Treating RN: [birth year] (77 y.o. [name], [name] Primary Care Jamaurion Slemmer: [name] Other Clinician: Referring Eliyohu Class: Treating Abiageal Blowe/Extender: [name] Weeks in Treatment: 26 Wound Status Wound Number: 10 Primary Lymphedema Etiology: Wound Location: Left, Lateral Lower Leg Wound Open Wounding Event: Gradually Appeared Status: Date Acquired: 09/16/2022 Comorbid Anemia, Lymphedema, Deep Vein Thrombosis, Hypertension, Weeks Of Treatment: 1 History: Received Radiation Clustered Wound: No Photos Wound Measurements Length: (cm) 0.3 Width: (cm) 1 Depth: (cm) 0.1 Area: (cm) 0.236 Volume: (cm) 0.024 % Reduction in Area: 59.9% % Reduction in Volume: 59.3% Epithelialization: Medium (34-66%) Tunneling: No Undermining: No Wound Description Classification: Full Thickness Without Exposed Support Wound Margin: Distinct, outline attached Exudate Amount: Medium Exudate Type: Serosanguineous Exudate Color: red, brown Structures Foul Odor After Cleansing: No Slough/Fibrino Yes Wound Bed Granulation Amount: None Present (0%) Exposed Structure Necrotic Amount: Large (67-100%) Fascia Exposed: No Necrotic Quality: Adherent Slough Fat Layer (Subcutaneous Tissue) Exposed: Yes Tendon Exposed: No Muscle Exposed: No Joint Exposed: No Bone Exposed: No Periwound Skin Texture Texture Color No Abnormalities Noted: No No Abnormalities Noted: No Callus: No Atrophie  Blanche: No Crepitus: No Cyanosis: No Excoriation: No Ecchymosis: No Induration: No Erythema: No Holst, Areon ([id number]) [id number] Page 5 of 9 Induration: No Erythema: No Rash: No Hemosiderin Staining: No Scarring: No Mottled: No Pallor: No Moisture Rubor: No No Abnormalities Noted: No Dry / Scaly: No Maceration: Yes Treatment Notes Wound #10 (Lower Leg) Wound Laterality:  Left, Lateral Cleanser Soap and Water Discharge Instruction: May shower and wash wound with dial antibacterial soap and water prior to dressing change. Peri-Wound Care Sween Lotion (Moisturizing lotion) Discharge Instruction: Apply moisturizing lotion as directed Topical Primary Dressing Maxorb Extra Ag+ Alginate Dressing, 4x4.75 (in/in) Discharge Instruction: Apply to wound bed as instructed Secondary Dressing ABD Pad, 5x9 Discharge Instruction: Apply over primary dressing as directed. Woven Gauze Sponge, Non-Sterile 4x4 in Discharge Instruction: Apply over primary dressing as directed. Secured With [employer], 4.5x3.1 (in/yd) Discharge Instruction: Secure with Kerlix as directed. 68M Medipore H Soft Cloth Surgical T ape, 4 x 10 (in/yd) Discharge Instruction: Secure with tape as directed. Compression Wrap Juxtafit 30-59mmHg Discharge Instruction: apply in the morning and remove at night. Compression Stockings Add-Ons Electronic Signature(s) Signed: 09/24/2022 4:57:58 PM By: [name] Stall RN, [name] Entered By: [name] on 09/24/2022 14:32:13 -------------------------------------------------------------------------------- Wound Assessment Details Patient Name: Date of Service: [name], [name] 09/24/2022 2:15 PM Medical Record Number: [id number] Patient Account Number: [id number] Date of Birth/Sex: Treating RN: [name] (77 y.o. [name], [name] Primary Care Ayven Pheasant: [name] Other Clinician: Referring Sallie Maker: Treating Tarrin Lebow/Extender: [name] Weeks in Treatment: 26 Wound Status Wound Number: 6 Primary Lymphedema Etiology: Wound Location: Right, Medial Lower Leg Wound Open Wounding Event: Gradually Appeared Status: Date Acquired: 03/05/2022 Comorbid Anemia, Lymphedema, Deep Vein Thrombosis, Hypertension, Weeks Of Treatment: 26 History: Received Radiation Clustered Wound: Yes Wound Measurements Length: (cm) 9 Width: (cm)  5 Depth: (cm) 0.4 Andreason, Thadius ([id number]) Clustered Quantity: 1 Area: (cm) 35.343 Volume: (cm) 14.137 % Reduction in Area: 75.3% % Reduction in Volume: 1.1% Epithelialization: Medium (34-66%) [phone].pdf Page 6 of 9 Tunneling: No Undermining: No Wound Description Classification: Full Thickness Without Exposed Sup Wound Margin: Distinct, outline attached Exudate Amount: Medium Exudate Type: Serosanguineous Exudate Color: red, brown port Structures Foul Odor After Cleansing: No Slough/Fibrino Yes Wound Bed Granulation Amount: None Present (0%) Exposed Structure Necrotic Amount: Large (67-100%) Fascia Exposed: No Necrotic Quality: Adherent Slough Fat Layer (Subcutaneous Tissue) Exposed: Yes Tendon Exposed: No Muscle Exposed: No Joint Exposed: No Bone Exposed: No Periwound Skin Texture Texture Color No Abnormalities Noted: No No Abnormalities Noted: No Callus: No Atrophie Blanche: No Crepitus: No Cyanosis: No Excoriation: No Ecchymosis: No Induration: No Erythema: No Rash: No Hemosiderin Staining: Yes Scarring: Yes Mottled: No Pallor: No Moisture Rubor: No No Abnormalities Noted: No Dry / Scaly: No Maceration: No Electronic Signature(s) Signed: 09/24/2022 4:57:58 PM By: [name] Stall RN, [name] Entered By: [name] on 09/24/2022 14:30:56 -------------------------------------------------------------------------------- Wound Assessment Details Patient Name: Date of Service: [name], [name] 09/24/2022 2:15 PM Medical Record Number: [id number] Patient Account Number: [id number] Date of Birth/Sex: Treating RN: [name] (77 y.o. [name] Primary Care Marycatherine Maniscalco: [name] Other Clinician: Referring Yogi Arther: Treating Una Yeomans/Extender: [name] Weeks in Treatment: 26 Wound Status Wound Number: 6 Primary Lymphedema Etiology: Wound Location: Right, Medial Lower Leg Wound Open Wounding Event:  Gradually Appeared Status: Date Acquired: 03/05/2022 Comorbid Anemia, Lymphedema, Deep Vein Thrombosis, Hypertension, Weeks Of Treatment: 26 History: Received Radiation Clustered Wound: Yes Photos Wound Measurements Batts, Jayron ([id number]) Length: (cm) 9  Width: (cm) 5 Depth: (cm) 0.3 Clustered Quantity: 1 Area: (cm) 35.343 Volume: (cm) 10.603 [id number] Page 7 of 9 % Reduction in Area: 75.3% % Reduction in Volume: 25.8% Epithelialization: Medium (34-66%) Wound Description Classification: Full Thickness Without Exposed Sup Wound Margin: Distinct, outline attached Exudate Amount: Medium Exudate Type: Serosanguineous Exudate Color: red, brown port Structures Foul Odor After Cleansing: No Slough/Fibrino Yes Wound Bed Granulation Amount: Small (1-33%) Exposed Structure Granulation Quality: Pink Fascia Exposed: No Necrotic Amount: Large (67-100%) Fat Layer (Subcutaneous Tissue) Exposed: Yes Necrotic Quality: Adherent Slough Tendon Exposed: No Muscle Exposed: No Joint Exposed: No Bone Exposed: No Periwound Skin Texture Texture Color No Abnormalities Noted: No No Abnormalities Noted: No Callus: No Atrophie Blanche: No Crepitus: No Cyanosis: No Excoriation: No Ecchymosis: No Induration: No Erythema: No Rash: No Hemosiderin Staining: Yes Scarring: Yes Mottled: No Pallor: No Moisture Rubor: No No Abnormalities Noted: No Dry / Scaly: No Maceration: No Treatment Notes Wound #6 (Lower Leg) Wound Laterality: Right, Medial Cleanser Soap and Water Discharge Instruction: May shower and wash wound with dial antibacterial soap and water prior to dressing change. Peri-Wound Care Sween Lotion (Moisturizing lotion) Discharge Instruction: Apply moisturizing lotion as directed Topical Primary Dressing Maxorb Extra Ag+ Alginate Dressing, 4x4.75 (in/in) Discharge Instruction: Apply to wound bed as instructed Secondary Dressing ABD Pad,  5x9 Discharge Instruction: Apply over primary dressing as directed. Woven Gauze Sponge, Non-Sterile 4x4 in Discharge Instruction: Apply over primary dressing as directed. Secured With [employer], 4.5x3.1 (in/yd) Discharge Instruction: Secure with Kerlix as directed. 77M Medipore H Soft Cloth Surgical T ape, 4 x 10 (in/yd) Discharge Instruction: Secure with tape as directed. Compression Wrap Juxtafit 30-72mmHg Discharge Instruction: apply in the morning and remove at night. Compression Stockings Perrow, [name] ([id number]) [phone].pdf Page 8 of 9 Add-Ons Electronic Signature(s) Signed: 09/24/2022 4:57:58 PM By: [name] Stall RN, [name] Entered By: [name] on 09/24/2022 14:30:57 -------------------------------------------------------------------------------- Wound Assessment Details Patient Name: Date of Service: [name], [name] 09/24/2022 2:15 PM Medical Record Number: [id number] Patient Account Number: [id number] Date of Birth/Sex: Treating RN: [name] (77 y.o. [name], [name] Primary Care Lavoris Canizales: [name] Other Clinician: Referring Neilah Fulwider: Treating Cyndra Feinberg/Extender: [name] Weeks in Treatment: 26 Wound Status Wound Number: 7 Primary Lymphedema Etiology: Wound Location: Left, Medial Lower Leg Wound Open Wounding Event: Gradually Appeared Status: Date Acquired: 03/05/2022 Comorbid Anemia, Lymphedema, Deep Vein Thrombosis, Hypertension, Weeks Of Treatment: 26 History: Received Radiation Clustered Wound: Yes Photos Wound Measurements Length: (cm) Width: (cm) Depth: (cm) Clustered Quantity: Area: (cm) Volume: (cm) 2.9 % Reduction in Area: 83.1% 3.5 % Reduction in Volume: 83.1% 0.1 Epithelialization: Medium (34-66%) 1 Tunneling: No 7.972 Undermining: No 0.797 Wound Description Classification: Full Thickness Without Exposed Sup Wound Margin: Distinct, outline attached Exudate Amount:  Medium Exudate Type: Serosanguineous Exudate Color: red, brown port Structures Foul Odor After Cleansing: No Slough/Fibrino Yes Wound Bed Granulation Amount: Small (1-33%) Exposed Structure Granulation Quality: Red, Pink Fascia Exposed: No Necrotic Amount: Large (67-100%) Fat Layer (Subcutaneous Tissue) Exposed: Yes Necrotic Quality: Adherent Slough Tendon Exposed: No Muscle Exposed: No Joint Exposed: No Bone Exposed: No Periwound Skin Texture Texture Color No Abnormalities Noted: No No Abnormalities Noted: No Callus: No Atrophie Blanche: No Crepitus: No Cyanosis: No Excoriation: No Ecchymosis: No Induration: No Erythema: No Rash: No Hemosiderin Staining: Yes Scarring: Yes Mottled: No Pallor: No Panjwani, Kadence ([id number]) [id number] Page 9 of 9 Pallor: No Moisture Rubor: No No Abnormalities Noted: No Dry / Scaly: No Maceration: Yes Treatment Notes  Wound #7 (Lower Leg) Wound Laterality: Left, Medial Cleanser Soap and Water Discharge Instruction: May shower and wash wound with dial antibacterial soap and water prior to dressing change. Peri-Wound Care Sween Lotion (Moisturizing lotion) Discharge Instruction: Apply moisturizing lotion as directed Topical Primary Dressing Maxorb Extra Ag+ Alginate Dressing, 4x4.75 (in/in) Discharge Instruction: Apply to wound bed as instructed Secondary Dressing ABD Pad, 5x9 Discharge Instruction: Apply over primary dressing as directed. Woven Gauze Sponge, Non-Sterile 4x4 in Discharge Instruction: Apply over primary dressing as directed. Secured With [employer], 4.5x3.1 (in/yd) Discharge Instruction: Secure with Kerlix as directed. 2M Medipore H Soft Cloth Surgical T ape, 4 x 10 (in/yd) Discharge Instruction: Secure with tape as directed. Compression Wrap Juxtafit 30-28mmHg Discharge Instruction: apply in the morning and remove at night. Compression Stockings Add-Ons Electronic  Signature(s) Signed: 09/24/2022 4:57:58 PM By: [name] Stall RN, [name] Entered By: [name] on 09/24/2022 14:31:29 -------------------------------------------------------------------------------- Vitals Details Patient Name: Date of Service: [name], [name] 09/24/2022 2:15 PM Medical Record Number: [id number] Patient Account Number: [id number] Date of Birth/Sex: Treating RN: [birth year][name] (77 y.o. [name] Primary Care Naiyana Barbian: [name] Other Clinician: Referring Tien Spooner: Treating Analleli Gierke/Extender: [name] Weeks in Treatment: 26 Vital Signs Time Taken: 14:31 Temperature (F): 98.1 Height (in): 74 Pulse (bpm): 90 Weight (lbs): 250 Respiratory Rate (breaths/min): 20 Body Mass Index (BMI): 32.1 Blood Pressure (mmHg): 175/72 Reference Range: 80 - 120 mg / dl Electronic Signature(s) Signed: 09/24/2022 4:57:58 PM By: [name] Stall RN, [name] Entered By: [name] on 09/24/2022 14:33:26

## 2022-10-01 ENCOUNTER — Ambulatory Visit (HOSPITAL_BASED_OUTPATIENT_CLINIC_OR_DEPARTMENT_OTHER): Payer: Medicare Other | Admitting: Physician Assistant

## 2022-10-08 ENCOUNTER — Encounter (HOSPITAL_BASED_OUTPATIENT_CLINIC_OR_DEPARTMENT_OTHER): Payer: Medicare Other | Admitting: Physician Assistant

## 2022-10-08 DIAGNOSIS — I890 Lymphedema, not elsewhere classified: Secondary | ICD-10-CM | POA: Diagnosis not present

## 2022-10-15 ENCOUNTER — Encounter (HOSPITAL_BASED_OUTPATIENT_CLINIC_OR_DEPARTMENT_OTHER): Payer: Medicare Other | Admitting: Physician Assistant

## 2022-10-15 DIAGNOSIS — I890 Lymphedema, not elsewhere classified: Secondary | ICD-10-CM | POA: Diagnosis not present

## 2022-10-22 ENCOUNTER — Encounter (HOSPITAL_BASED_OUTPATIENT_CLINIC_OR_DEPARTMENT_OTHER): Payer: Medicare Other | Attending: Physician Assistant | Admitting: Physician Assistant

## 2022-10-22 DIAGNOSIS — Z86718 Personal history of other venous thrombosis and embolism: Secondary | ICD-10-CM | POA: Diagnosis not present

## 2022-10-22 DIAGNOSIS — I87333 Chronic venous hypertension (idiopathic) with ulcer and inflammation of bilateral lower extremity: Secondary | ICD-10-CM | POA: Insufficient documentation

## 2022-10-22 DIAGNOSIS — I5032 Chronic diastolic (congestive) heart failure: Secondary | ICD-10-CM | POA: Insufficient documentation

## 2022-10-22 DIAGNOSIS — L97828 Non-pressure chronic ulcer of other part of left lower leg with other specified severity: Secondary | ICD-10-CM | POA: Insufficient documentation

## 2022-10-22 DIAGNOSIS — I872 Venous insufficiency (chronic) (peripheral): Secondary | ICD-10-CM | POA: Diagnosis not present

## 2022-10-22 DIAGNOSIS — N1832 Chronic kidney disease, stage 3b: Secondary | ICD-10-CM | POA: Diagnosis not present

## 2022-10-22 DIAGNOSIS — Z7901 Long term (current) use of anticoagulants: Secondary | ICD-10-CM | POA: Insufficient documentation

## 2022-10-22 DIAGNOSIS — I130 Hypertensive heart and chronic kidney disease with heart failure and stage 1 through stage 4 chronic kidney disease, or unspecified chronic kidney disease: Secondary | ICD-10-CM | POA: Diagnosis not present

## 2022-10-22 DIAGNOSIS — L84 Corns and callosities: Secondary | ICD-10-CM | POA: Diagnosis not present

## 2022-10-22 DIAGNOSIS — L97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 other part of right lower leg with other specified severity: Secondary | ICD-10-CM | POA: Insufficient documentation

## 2022-10-22 DIAGNOSIS — I890 Lymphedema, not elsewhere classified: Secondary | ICD-10-CM | POA: Diagnosis not present

## 2022-10-29 ENCOUNTER — Ambulatory Visit (HOSPITAL_BASED_OUTPATIENT_CLINIC_OR_DEPARTMENT_OTHER): Payer: Medicare Other | Admitting: Physician Assistant

## 2022-11-05 ENCOUNTER — Encounter (HOSPITAL_BASED_OUTPATIENT_CLINIC_OR_DEPARTMENT_OTHER): Payer: Medicare Other | Admitting: Physician Assistant

## 2022-11-05 DIAGNOSIS — I87333 Chronic venous hypertension (idiopathic) with ulcer and inflammation of bilateral lower extremity: Secondary | ICD-10-CM | POA: Diagnosis not present

## 2022-11-05 NOTE — Progress Notes (Addendum)
Siedschlag, [name] ([id number]) [id number] Page 1 of 6 Visit Report for 11/05/2022 Chief Complaint Document Details Patient Name: Date of Service: [name], [name] 11/05/2022 2:15 PM Medical Record Number: [id number] Patient Account Number: [id number] Date of Birth/Sex: Treating RN: 25-Nov-1945 (77 y.o. M) Primary Care Provider: [name] Other Clinician: Referring Provider: Treating Provider/Extender: [name] Weeks in Treatment: 89 Information Obtained from: Patient Chief Complaint 02/23/2020; patient is here for wounds on his bilateral lower legs in the setting of severe lymphedema 03/26/2022; patient is here for wounds on his bilateral lower legs medial aspect Electronic Signature(s) Signed: 11/05/2022 2:43:49 PM By: [name] PA-C Entered By: [name] on 11/05/2022 14:43:49 -------------------------------------------------------------------------------- Debridement Details Patient Name: Date of Service: [name], [name] 11/05/2022 2:15 PM Medical Record Number: [id number] Patient Account Number: [id number] Date of Birth/Sex: Treating RN: [birth year] (76 y.o. [name], Millard.Loa Primary Care Provider: [name] Other Clinician: Referring Provider: Treating Provider/Extender: [name] Weeks in Treatment: 32 Debridement Performed for Assessment: Wound #7 Left,Medial Lower Leg Performed By: Physician [name], PA Debridement Type: Debridement Level of Consciousness (Pre-procedure): Awake and Alert Pre-procedure Verification/Time Out Yes - 14:45 Taken: Start Time: 14:46 Pain Control: Lidocaine 4% T opical Solution Percent of Wound Bed Debrided: 100% T Area Debrided (cm): otal 6.12 Tissue and other material debrided: Viable, Non-Viable, Slough, Skin: Dermis , Skin: Epidermis, Biofilm, Slough Level: Skin/Epidermis Debridement Description: Selective/Open Wound Instrument: Curette Bleeding:  Minimum Hemostasis Achieved: Pressure End Time: 14:50 Procedural Pain: 0 Post Procedural Pain: 0 Response to Treatment: Procedure was tolerated well Level of Consciousness (Post- Awake and Alert procedure): Post Debridement Measurements of Total Wound Length: (cm) 2.6 Width: (cm) 3 Depth: (cm) 0.2 Volume: (cm) 1.225 Character of Wound/Ulcer Post Debridement: Improved [name] ([id number]) [id number] Page 2 of 6 Post Procedure Diagnosis Same as Pre-procedure Electronic Signature(s) Signed: 11/05/2022 3:27:24 PM By: [name] RN, [name] Signed: 11/06/2022 5:52:49 PM By: [name] PA-C Entered By: [name] on 11/05/2022 14:50:30 -------------------------------------------------------------------------------- Debridement Details Patient Name: Date of Service: [name], [name] 11/05/2022 2:15 PM Medical Record Number: [id number] Patient Account Number: [id number] Date of Birth/Sex: Treating RN: 1945-08-09 (77 y.o. [name], Millard.Loa Primary Care Provider: [name] Other Clinician: Referring Provider: Treating Provider/Extender: [name] Weeks in Treatment: 32 Debridement Performed for Assessment: Wound #6 Right,Medial Lower Leg Performed By: Physician [name], PA Debridement Type: Debridement Level of Consciousness (Pre-procedure): Awake and Alert Pre-procedure Verification/Time Out Yes - 14:45 Taken: Start Time: 14:46 Pain Control: Lidocaine 4% T opical Solution Percent of Wound Bed Debrided: 75% T Area Debrided (cm): otal 18.84 Tissue and other material debrided: Viable, Non-Viable, Slough, Skin: Dermis , Skin: Epidermis, Biofilm, Slough Level: Skin/Epidermis Debridement Description: Selective/Open Wound Instrument: Curette Bleeding: Minimum Hemostasis Achieved: Pressure End Time: 14:50 Procedural Pain: 0 Post Procedural Pain: 0 Response to Treatment: Procedure was tolerated well Level of  Consciousness (Post- Awake and Alert procedure): Post Debridement Measurements of Total Wound Length: (cm) 8 Width: (cm) 4 Depth: (cm) 0.2 Volume: (cm) 5.027 Character of Wound/Ulcer Post Debridement: Improved Post Procedure Diagnosis Same as Pre-procedure Electronic Signature(s) Signed: 11/05/2022 3:27:24 PM By: [name] RN, [name] Signed: 11/06/2022 5:52:49 PM By: [name] PA-C Entered By: [name] on 11/05/2022 14:53:22 -------------------------------------------------------------------------------- Physician Orders Details Patient Name: Date of Service: [name], [name] 11/05/2022 2:15 PM Medical Record Number: [id number] Patient Account Number: [id number] [name] ([id number]) [id number] Page 3 of 6 Date of  Birth/Sex: Treating RN: [birth year] (77 y.o. 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 Leonard Schwartz Wednesday 11/12/2022 2pm room 9 Return A ppointment in 2 weeks. Leonard Schwartz Wednesday 11/19/2022 2pm room 9 Return A Anesthetic (In clinic) Topical Lidocaine 5% applied to wound bed Bathing/ Shower/ Hygiene May shower and wash wound with soap and water. - apply clean dressing after bathing. Do not soak legs in tub, may rinse in shower Edema Control - Lymphedema / SCD / Other Lymphedema Pumps. Use Lymphedema pumps on leg(s) 2-3  times a day for 45-60 minutes. If wearing any wraps or hose, do not remove them. Continue exercising as instructed. - *****pump 3 times a day for an hour each time.***** Elevate legs to the level of the heart or above for 30 minutes daily and/or when sitting for 3-4 times a day throughout the day. - important Avoid standing for long periods of time. Exercise regularly Compression stocking or Garment 30-40 mm/Hg pressure to: - juxtafit Essentials Long for bilateral lower legs; quantity three juxtafit essentials per limb. Wound Treatment Wound #11 - Lower Leg Wound Laterality: Left, Posterior Cleanser: Soap and Water 1 x Per Day/30 Days Discharge Instructions: May shower and wash wound with dial antibacterial soap and water prior to dressing change. Peri-Wound Care: Sween Lotion (Moisturizing lotion) 1 x Per Day/30 Days Discharge Instructions: Apply moisturizing lotion as directed Topical: Keystone 1 x Per Day/30 Days Prim Dressing: Maxorb Extra Ag+ Alginate Dressing, 4x4.75 (in/in) 1 x Per Day/30 Days ary Discharge Instructions: Apply to wound bed as instructed Secondary Dressing: ABD Pad, 5x9 1 x Per Day/30 Days Discharge Instructions: Apply over primary dressing as directed. Secondary Dressing: Woven Gauze Sponge, Non-Sterile 4x4 in 1 x Per Day/30 Days Discharge Instructions: Apply over primary dressing as directed. Secured With: [employer], 4.5x3.1 (in/yd) 1 x Per Day/30 Days Discharge Instructions: Secure with Kerlix as directed. Secured With: 49M Medipore H Soft Cloth Surgical T ape, 4 x 10 (in/yd) 1 x Per Day/30 Days Discharge Instructions: Secure with tape as directed. Compression Wrap: Juxtafit 30-30mmHg 1 x Per Day/30 Days Discharge Instructions: apply in the morning and remove at night. Wound #6 - Lower Leg Wound Laterality: Right, Medial Cleanser: Soap and Water 1 x Per Day/30 Days Discharge Instructions: May shower and wash wound with dial antibacterial soap and water  prior to dressing change. Peri-Wound Care: Sween Lotion (Moisturizing lotion) 1 x Per Day/30 Days Discharge Instructions: Apply moisturizing lotion as directed Topical: Keystone 1 x Per Day/30 Days Prim Dressing: Maxorb Extra Ag+ Alginate Dressing, 4x4.75 (in/in) 1 x Per Day/30 Days ary Discharge Instructions: Apply to wound bed as instructed Secondary Dressing: ABD Pad, 5x9 1 x Per Day/30 Days [name], [name] ([id number]) [id number] Page 4 of 6 Discharge Instructions: Apply over primary dressing as directed. Secondary Dressing: Woven Gauze Sponge, Non-Sterile 4x4 in 1 x Per Day/30 Days Discharge Instructions: Apply over primary dressing as directed. Secured With: [employer], 4.5x3.1 (in/yd) 1 x Per Day/30 Days Discharge Instructions: Secure  with Kerlix as directed. Secured With: 15M Medipore H Soft Cloth Surgical T ape, 4 x 10 (in/yd) 1 x Per Day/30 Days Discharge Instructions: Secure with tape as directed. Compression Wrap: Juxtafit 30-39mmHg 1 x Per Day/30 Days Discharge Instructions: apply in the morning and remove at night. Wound #7 - Lower Leg Wound Laterality: Left, Medial Cleanser: Soap and Water 1 x Per Day/30 Days Discharge Instructions: May shower and wash wound with dial antibacterial soap and water prior to dressing change. Peri-Wound Care: Sween Lotion (Moisturizing lotion) 1 x Per Day/30 Days Discharge Instructions: Apply moisturizing lotion as directed Topical: Keystone 1 x Per Day/30 Days Prim Dressing: Maxorb Extra Ag+ Alginate Dressing, 4x4.75 (in/in) 1 x Per Day/30 Days ary Discharge Instructions: Apply to wound bed as instructed Secondary Dressing: ABD Pad, 5x9 1 x Per Day/30 Days Discharge Instructions: Apply over primary dressing as directed. Secondary Dressing: Woven Gauze Sponge, Non-Sterile 4x4 in 1 x Per Day/30 Days Discharge Instructions: Apply over primary dressing as directed. Secured With: [employer], 4.5x3.1  (in/yd) 1 x Per Day/30 Days Discharge Instructions: Secure with Kerlix as directed. Secured With: 15M Medipore H Soft Cloth Surgical T ape, 4 x 10 (in/yd) 1 x Per Day/30 Days Discharge Instructions: Secure with tape as directed. Compression Wrap: Juxtafit 30-30mmHg 1 x Per Day/30 Days Discharge Instructions: apply in the morning and remove at night. Electronic Signature(s) Signed: 11/05/2022 3:27:24 PM By: [name] RN, [name] Signed: 11/06/2022 5:52:49 PM By: [name] PA-C Entered By: [name] on 11/05/2022 14:52:39 -------------------------------------------------------------------------------- Problem List Details Patient Name: Date of Service: [name], [name] 11/05/2022 2:15 PM Medical Record Number: [id number] Patient Account Number: [id number] Date of Birth/Sex: Treating RN: Dec 09, 1945 (77 y.o. [name] Primary Care Provider: [name] Other Clinician: Referring Provider: Treating Provider/Extender: [name] Weeks in Treatment: 71 Active Problems ICD-10 Encounter Code Description Active Date MDM Diagnosis I87.333 Chronic venous hypertension (idiopathic) with ulcer and inflammation of 06/11/2022 No Yes bilateral lower extremity Agro, [name] ([id number]) [id number] Page 5 of 6 I89.0 Lymphedema, not elsewhere classified 03/26/2022 No Yes L97.828 Non-pressure chronic ulcer of other part of left lower leg with other specified 03/26/2022 No Yes severity 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 other part of right lower leg with other specified 03/26/2022 No Yes severity L84 Corns and callosities 10/15/2022 No Yes Inactive Problems Resolved Problems Electronic Signature(s) Signed: 11/05/2022 2:43:36 PM By: [name] PA-C Entered By: [name] on 11/05/2022 14:43:36 -------------------------------------------------------------------------------- SuperBill Details Patient Name: Date of Service: [name], [name]  [name] 11/05/2022 Medical Record Number: [id number] Patient Account Number: [id number] Date of Birth/Sex: Treating RN: August 19, 1945 (77 y.o. 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 L84 Corns and callosities Facility Procedures : CPT4 Code: [id number] Description: 97597 - DEBRIDE WOUND 1ST 20 SQ CM OR < ICD-10 Diagnosis Description L97.828 Non-pressure chronic ulcer of other part of left lower leg with other specified s 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 other part of right lower leg with other  specified I87.333 Chronic venous hypertension (idiopathic) with ulcer and inflammation of bilateral Modifier: everity severity  lower extremity Quantity: 1 : CPT4 Code: [id number] Description: [phone] - DEBRIDE WOUND EA ADDL 20 SQ CM ICD-10 Diagnosis Description 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 other part of right lower leg with other specified L97.828 Non-pressure chronic ulcer of other part of left lower leg with other  specified s I87.333 Chronic venous hypertension (idiopathic) with ulcer and inflammation of bilateral Modifier: severity everity lower extremity Quantity: 1 Physician Procedures : CPT4 Code Description Modifier [id number] - WC PHYS DEBR WO ANESTH 20 SQ CM [name], [name] ([id number]) [id number] Quantity: 1 pdf Page 6 of 6 : ICD-10 Diagnosis Description L97.828 Non-pressure chronic ulcer of other part of left lower leg with other  specified severity 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 other part of right lower leg with other specified severity I87.333 Chronic venous  hypertension (idiopathic) with ulcer and inflammation of bilateral lower extremity Quantity: : [id number] 97598 - WC PHYS DEBR WO ANESTH EA ADD 20 CM 1 ICD-10 Diagnosis Description 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 other part of right lower leg with other specified severity L97.828 Non-pressure chronic ulcer of other part of left lower leg with  other specified severity I87.333 Chronic venous hypertension (idiopathic) with ulcer and inflammation of bilateral lower extremity Quantity: Electronic Signature(s) Signed: 11/05/2022 3:27:24 PM By: [name] RN, [name] Signed: 11/06/2022 5:52:49 PM By: [name] PA-C Entered By: [name] on 11/05/2022 14:53:38

## 2022-11-07 NOTE — Progress Notes (Signed)
[name] ([id number]) [id number] Page 1 of 10 Visit Report for 11/05/2022 Arrival Information Details Patient Name: Date of Service: [name], [name] 11/05/2022 2:15 PM Medical Record Number: [id number] Patient Account Number: [id number] Date of Birth/Sex: Treating RN: Jan 05, 1946 (77 y.o. M) Primary Care [name]: [name] Other Clinician: Referring [name]: Treating [name]/Extender: [name]: 32 Visit Information History Since Last Visit Added or deleted any medications: No Patient Arrived: Cane Any new allergies or adverse reactions: No Arrival Time: 14:12 Had a fall or experienced change in No Accompanied By: self activities of daily living that may affect Transfer Assistance: None risk of falls: Patient Identification Verified: Yes Signs or symptoms of abuse/neglect since last visito No Secondary Verification Process Completed: Yes Hospitalized since last visit: No Patient Requires Transmission-Based Precautions: No Implantable device outside of the clinic excluding No Patient Has Alerts: Yes cellular tissue based products placed in the center Patient Alerts: Patient on Blood Thinner since last visit: Right ABI in clinic Outlook Has Dressing in Place as Prescribed: Yes Has Compression in Place as Prescribed: Yes Pain Present Now: Yes Electronic Signature(s) Signed: 11/07/2022 2:31:37 PM By: [name] Entered By: [name] on 11/05/2022 14:12:53 -------------------------------------------------------------------------------- Encounter Discharge Information Details Patient Name: Date of Service: [name], [name] 11/05/2022 2:15 PM Medical Record Number: [id number] Patient Account Number: [id number] Date of Birth/Sex: Treating RN: 05/29/1945 (77 y.o. [name] Primary Care Teyon Odette: [name] Other Clinician: Referring Yanet Balliet: Treating [name]/Extender: [name]: 83 Encounter Discharge Information Items Post Procedure Vitals Discharge Condition: Stable Temperature (F): 98.2 Ambulatory Status: Cane Pulse (bpm): 81 Discharge Destination: Home Respiratory Rate (breaths/min): 20 Transportation: Private Auto Blood Pressure (mmHg): 154/64 Accompanied By: self Schedule Follow-up Appointment: Yes Clinical Summary of Care: Electronic Signature(s) Signed: 11/05/2022 3:27:24 PM By: [name] Stall RN, [name] Entered By: [name] on 11/05/2022 14:54:19 [name] ([id number]) [id number] Page 2 of 10 -------------------------------------------------------------------------------- Lower Extremity Assessment Details Patient Name: Date of Service: [name], [name] 11/05/2022 2:15 PM Medical Record Number: [id number] Patient Account Number: [id number] Date of Birth/Sex: Treating RN: 01-19-1946 (77 y.o. M) Primary Care Arriel Victor: [name] Other Clinician: Referring [name]: Treating [name]/Extender: [name] Weeks in [name]: 32 Edema Assessment Assessed: [Left: No] [Right: No] Edema: [Left: Yes] [Right: Yes] Calf Left: Right: Point of Measurement: 40 cm From Medial Instep 53.5 cm 61.6 cm Ankle Left: Right: Point of Measurement: 13 cm From Medial Instep 42.1 cm 44 cm Electronic Signature(s) Signed: 11/07/2022 2:31:37 PM By: [name] Entered By: [name] on 11/05/2022 14:25:36 -------------------------------------------------------------------------------- Multi-Disciplinary Care Plan Details Patient Name: Date of Service: [name], [name] 11/05/2022 2:15 PM Medical Record Number: [id number] Patient Account Number: [id number] Date of Birth/Sex: Treating RN: 09-10-1945 (77 y.o. [name] Primary Care [name]: [name] Other Clinician: Referring [name]: Treating [name]/Extender: [name]: 19 Active Inactive Wound/Skin Impairment Nursing Diagnoses: Impaired tissue integrity Knowledge deficit related to ulceration/compromised skin integrity Goals: Patient will have a decrease in wound volume by X% from date: (specify in notes) Date Initiated: 03/26/2022 Target Resolution Date: 01/17/2023 Goal Status: Active Patient/caregiver will verbalize understanding of skin care regimen Date Initiated: 03/26/2022 Target Resolution Date: 01/17/2023 Goal Status: Active Ulcer/skin breakdown will have a volume reduction of 30% by week 4 Date Initiated: 03/26/2022 Date Inactivated: 05/21/2022 Target Resolution Date: 05/17/2022 Unmet Reason: see wound Goal Status: Unmet measurement. Ulcer/skin  breakdown will have a volume reduction of 50% by week 8 Date Initiated: 03/26/2022 Date Inactivated: 05/21/2022 Target Resolution Date: 05/17/2022 Unmet Reason: see wound Goal Status: Unmet measurement. [name], Dannielle [name] ([id number]) [id number] Page 3 of 10 Interventions: Assess patient/caregiver ability to obtain necessary supplies Assess patient/caregiver ability to perform ulcer/skin care regimen upon admission and as needed Assess ulceration(s) every visit Notes: Patient stated today, "I will take my fluid pill or pump not do both." [name] made aware. Electronic Signature(s) Signed: 11/05/2022 3:27:24 PM By: [name] Stall RN, [name] Entered By: [name] on 11/05/2022 14:43:03 -------------------------------------------------------------------------------- Pain Assessment Details Patient Name: Date of Service: [name], [name] 11/05/2022 2:15 PM Medical Record Number: [id number] Patient Account Number: [id number] Date of Birth/Sex: Treating RN: Sep 18, 1945 (77 y.o. M) Primary Care Cheyanne Lamison: [name] Other Clinician: Referring Jaryd Drew: Treating [name]/Extender: [name]: 32 Active  Problems Location of Pain Severity and Description of Pain Patient Has Paino Yes Site Locations Pain Location: Generalized Pain Rate the pain. Current Pain Level: 3 Pain Management and Medication Current Pain Management: Electronic Signature(s) Signed: 11/07/2022 2:31:37 PM By: [name] Entered By: [name] on 11/05/2022 14:13:13 -------------------------------------------------------------------------------- Patient/Caregiver Education Details Patient Name: Date of Service: [name], [name] 6/19/2024andnbsp2:15 PM Medical Record Number: [id number] Patient Account Number: [id number] Date of Birth/Gender: Treating RN: 1946-01-18 (77 y.o. [name] Primary Care Physician: [name] Other Clinician: Ohanesian, Dannielle [name] ([id number]) [id number] Page 4 of 10 Referring Physician: Treating Physician/Extender: [name]: 41 Education Assessment Education Provided To: Patient Education Topics Provided Wound/Skin Impairment: Handouts: Caring for Your Ulcer Methods: Explain/Verbal Responses: Reinforcements needed Electronic Signature(s) Signed: 11/05/2022 3:27:24 PM By: [name] Stall RN, [name] Entered By: [name] on 11/05/2022 14:43:25 -------------------------------------------------------------------------------- Wound Assessment Details Patient Name: Date of Service: [name], [name] 11/05/2022 2:15 PM Medical Record Number: [id number] Patient Account Number: [id number] Date of Birth/Sex: Treating RN: 09/30/1945 (76 y.o. [name], [name] Primary Care Shivali Quackenbush: [name] Other Clinician: Referring Dustyn Dansereau: Treating Terrah Decoster/Extender: [name]: 32 Wound Status Wound Number: 10 Primary Lymphedema Etiology: Wound Location: Left, Lateral Lower Leg Wound Healed - Epithelialized Wounding Event: Gradually Appeared Status: Date Acquired:  09/16/2022 Comorbid Anemia, Lymphedema, Deep Vein Thrombosis, Hypertension, Weeks Of [name]: 7 History: Received Radiation Clustered Wound: No Photos Wound Measurements Length: (cm) Width: (cm) Depth: (cm) Area: (cm) Volume: (cm) 0 % Reduction in Area: 100% 0 % Reduction in Volume: 100% 0 Epithelialization: Large (67-100%) 0 Tunneling: No 0 Undermining: No Wound Description Classification: Full Thickness Without Exposed Support Wound Margin: Distinct, outline attached Exudate Amount: Medium Exudate Type: Serosanguineous Exudate Color: red, brown [name], [name] ([id number]) Wound Bed Granulation Amount: Large (67-100%) Granulation Quality: Pink Necrotic Amount: None Present (0%) Structures Foul Odor After Cleansing: No Slough/Fibrino Yes [id number] Page 5 of 10 Exposed Structure Fascia Exposed: No Fat Layer (Subcutaneous Tissue) Exposed: Yes Tendon Exposed: No Muscle Exposed: No Joint Exposed: No Bone Exposed: No Periwound Skin Texture Texture Color No Abnormalities Noted: No No Abnormalities Noted: No Callus: No Atrophie Blanche: No Crepitus: No Cyanosis: No Excoriation: No Ecchymosis: No Induration: No Erythema: No Rash: No Hemosiderin Staining: No Scarring: No Mottled: No Pallor: No Moisture Rubor: No No Abnormalities Noted: No Dry / Scaly: No Maceration: Yes [name] Notes Wound #10 (Lower Leg) Wound Laterality: Left, Lateral Cleanser Peri-Wound Care Topical Primary Dressing Secondary Dressing Secured With Compression Wrap Compression Stockings Add-Ons Electronic Signature(s) Signed: 11/05/2022 3:27:24  PM By: [name] Stall RN, [name] Entered By: [name] on 11/05/2022 14:48:19 -------------------------------------------------------------------------------- Wound Assessment Details Patient Name: Date of Service: Falotico, [name] 11/05/2022 2:15 PM Medical Record Number: [id number] Patient Account Number:  [id number] Date of Birth/Sex: Treating RN: Nov 02, 1945 (77 y.o. M) Primary Care Dexter Sauser: [name] Other Clinician: Referring Lashan Macias: Treating Martena Emanuele/Extender: [name] Weeks in [name]: 32 Wound Status Wound Number: 11 Primary Lymphedema Etiology: Wound Location: Left, Posterior Lower Leg Wound Open Wounding Event: Gradually Appeared Status: Date Acquired: 10/19/2022 Comorbid Anemia, Lymphedema, Deep Vein Thrombosis, Hypertension, Weeks Of [name]: 2 History: Received Radiation Clustered Wound: No Photos Fadness, Dannielle [name] ([id number]) [id number] Page 6 of 10 Wound Measurements Length: (cm) 0.5 Width: (cm) 0.3 Depth: (cm) 0.1 Area: (cm) 0.118 Volume: (cm) 0.012 % Reduction in Area: 99% % Reduction in Volume: 99% Epithelialization: Large (67-100%) Tunneling: No Undermining: No Wound Description Classification: Full Thickness Without Exposed Suppor Exudate Amount: Medium Exudate Type: Serosanguineous Exudate Color: red, brown t Structures Wound Bed Granulation Amount: Large (67-100%) Granulation Quality: Pink Periwound Skin Texture Texture Color No Abnormalities Noted: No No Abnormalities Noted: No Callus: No Atrophie Blanche: No Crepitus: No Cyanosis: No Excoriation: No Ecchymosis: No Induration: No Erythema: No Rash: No Hemosiderin Staining: No Scarring: No Mottled: No Pallor: No Moisture Rubor: No No Abnormalities Noted: No Dry / Scaly: Yes Temperature / Pain Maceration: No Temperature: No Abnormality [name] Notes Wound #11 (Lower Leg) Wound Laterality: Left, Posterior Cleanser Soap and Water Discharge Instruction: May shower and wash wound with dial antibacterial soap and water prior to dressing change. Peri-Wound Care Sween Lotion (Moisturizing lotion) Discharge Instruction: Apply moisturizing lotion as directed Topical Keystone Primary Dressing Maxorb Extra Ag+ Alginate  Dressing, 4x4.75 (in/in) Discharge Instruction: Apply to wound bed as instructed Secondary Dressing ABD Pad, 5x9 Discharge Instruction: Apply over primary dressing as directed. Woven Gauze Sponge, Non-Sterile 4x4 in Discharge Instruction: Apply over primary dressing as directed. Secured With [employer], 4.5x3.1 (in/yd) Discharge Instruction: Secure with Kerlix as directed. 37M Medipore H Soft Cloth Surgical Tape, 4 x 10 (in/yd) [name], [name] ([id number]) [id number] Page 7 of 10 Discharge Instruction: Secure with tape as directed. Compression Wrap Juxtafit 30-6mmHg Discharge Instruction: apply in the morning and remove at night. Compression Stockings Add-Ons Electronic Signature(s) Signed: 11/07/2022 2:31:37 PM By: [name] Entered By: [name] on 11/05/2022 14:35:45 -------------------------------------------------------------------------------- Wound Assessment Details Patient Name: Date of Service: [name], [name] 11/05/2022 2:15 PM Medical Record Number: [id number] Patient Account Number: [id number] Date of Birth/Sex: Treating RN: July 17, 1945 (77 y.o. M) Primary Care Montserrat Shek: [name] Other Clinician: Referring Shianna Bally: Treating Gorden Stthomas/Extender: [name] Weeks in [name]: 32 Wound Status Wound Number: 6 Primary Lymphedema Etiology: Wound Location: Right, Medial Lower Leg Wound Open Wounding Event: Gradually Appeared Status: Date Acquired: 03/05/2022 Comorbid Anemia, Lymphedema, Deep Vein Thrombosis, Hypertension, Weeks Of [name]: 32 History: Received Radiation Clustered Wound: Yes Photos Wound Measurements Length: (cm) Width: (cm) Depth: (cm) Clustered Quantity: Area: (cm) Volume: (cm) 8 % Reduction in Area: 82.4% 4 % Reduction in Volume: 64.8% 0.2 Epithelialization: Medium (34-66%) 1 Tunneling: No 25.133 Undermining: No 5.027 Wound Description Classification: Full  Thickness Without Exposed Sup Wound Margin: Distinct, outline attached Exudate Amount: Medium Exudate Type: Serosanguineous Exudate Color: red, brown port Structures Foul Odor After Cleansing: No Slough/Fibrino Yes Wound Bed Granulation Amount: Small (1-33%) Exposed Structure Granulation Quality: Pink Fascia Exposed: No Necrotic Amount: Large (67-100%) Fat Layer (Subcutaneous Tissue) Exposed: Yes Necrotic Quality: Adherent Slough Tendon  Exposed: No Muscle Exposed: No [name], [name] ([id number]) [id number] Page 8 of 10 Joint Exposed: No Bone Exposed: No Periwound Skin Texture Texture Color No Abnormalities Noted: No No Abnormalities Noted: No Callus: No Atrophie Blanche: No Crepitus: No Cyanosis: No Excoriation: No Ecchymosis: No Induration: No Erythema: No Rash: No Hemosiderin Staining: Yes Scarring: Yes Mottled: No Pallor: No Moisture Rubor: No No Abnormalities Noted: No Dry / Scaly: Yes Maceration: No [name] Notes Wound #6 (Lower Leg) Wound Laterality: Right, Medial Cleanser Soap and Water Discharge Instruction: May shower and wash wound with dial antibacterial soap and water prior to dressing change. Peri-Wound Care Sween Lotion (Moisturizing lotion) Discharge Instruction: Apply moisturizing lotion as directed Topical Keystone Primary Dressing Maxorb Extra Ag+ Alginate Dressing, 4x4.75 (in/in) Discharge Instruction: Apply to wound bed as instructed Secondary Dressing ABD Pad, 5x9 Discharge Instruction: Apply over primary dressing as directed. Woven Gauze Sponge, Non-Sterile 4x4 in Discharge Instruction: Apply over primary dressing as directed. Secured With [employer], 4.5x3.1 (in/yd) Discharge Instruction: Secure with Kerlix as directed. 42M Medipore H Soft Cloth Surgical T ape, 4 x 10 (in/yd) Discharge Instruction: Secure with tape as directed. Compression Wrap Juxtafit 30-36mmHg Discharge Instruction: apply  in the morning and remove at night. Compression Stockings Add-Ons Electronic Signature(s) Signed: 11/07/2022 2:31:37 PM By: [name] Entered By: [name] on 11/05/2022 14:36:16 -------------------------------------------------------------------------------- Wound Assessment Details Patient Name: Date of Service: [name], [name] 11/05/2022 2:15 PM Medical Record Number: [id number] Patient Account Number: [id number] Date of Birth/Sex: Treating RN: 08-21-1945 (77 y.o. M) Primary Care Asa Baudoin: [name] Other Clinician: [place], Dannielle [name] ([id number]) [id number] Page 9 of 10 Referring Latavion Halls: Treating Maclovia Uher/Extender: [name] Weeks in [name]: 32 Wound Status Wound Number: 7 Primary Lymphedema Etiology: Wound Location: Left, Medial Lower Leg Wound Open Wounding Event: Gradually Appeared Status: Date Acquired: 03/05/2022 Comorbid Anemia, Lymphedema, Deep Vein Thrombosis, Hypertension, Weeks Of [name]: 32 History: Received Radiation Clustered Wound: Yes Photos Wound Measurements Length: (cm) Width: (cm) Depth: (cm) Clustered Quantity: Area: (cm) Volume: (cm) 2.6 % Reduction in Area: 87% 3 % Reduction in Volume: 74% 0.2 Epithelialization: Medium (34-66%) 1 Tunneling: No 6.126 Undermining: No 1.225 Wound Description Classification: Full Thickness Without Exposed Sup Wound Margin: Distinct, outline attached Exudate Amount: Medium Exudate Type: Serosanguineous Exudate Color: red, brown port Structures Foul Odor After Cleansing: No Slough/Fibrino Yes Wound Bed Granulation Amount: Small (1-33%) Exposed Structure Granulation Quality: Pink Fascia Exposed: No Necrotic Amount: Large (67-100%) Fat Layer (Subcutaneous Tissue) Exposed: Yes Necrotic Quality: Adherent Slough Tendon Exposed: No Muscle Exposed: No Joint Exposed: No Bone Exposed: No Periwound Skin Texture Texture Color No Abnormalities  Noted: No No Abnormalities Noted: No Callus: No Atrophie Blanche: No Crepitus: No Cyanosis: No Excoriation: No Ecchymosis: No Induration: No Erythema: No Rash: No Hemosiderin Staining: Yes Scarring: Yes Mottled: No Pallor: No Moisture Rubor: No No Abnormalities Noted: No Dry / Scaly: No Maceration: Yes [name] Notes Wound #7 (Lower Leg) Wound Laterality: Left, Medial Cleanser Soap and Water Discharge Instruction: May shower and wash wound with dial antibacterial soap and water prior to dressing change. Peri-Wound Care Sween Lotion (Moisturizing lotion) Melick, Dannielle [name] ([id number]) [phone].pdf Page 10 of 10 Discharge Instruction: Apply moisturizing lotion as directed Topical Keystone Primary Dressing Maxorb Extra Ag+ Alginate Dressing, 4x4.75 (in/in) Discharge Instruction: Apply to wound bed as instructed Secondary Dressing ABD Pad, 5x9 Discharge Instruction: Apply over primary dressing as directed. Woven Gauze Sponge, Non-Sterile 4x4 in Discharge Instruction: Apply over primary dressing as directed. Secured With [employer]  Sterile, 4.5x3.1 (in/yd) Discharge Instruction: Secure with Kerlix as directed. 51M Medipore H Soft Cloth Surgical T ape, 4 x 10 (in/yd) Discharge Instruction: Secure with tape as directed. Compression Wrap Juxtafit 30-85mmHg Discharge Instruction: apply in the morning and remove at night. Compression Stockings Add-Ons Electronic Signature(s) Signed: 11/07/2022 2:31:37 PM By: [name] Entered By: [name] on 11/05/2022 14:35:48 -------------------------------------------------------------------------------- Vitals Details Patient Name: Date of Service: [name], [name] 11/05/2022 2:15 PM Medical Record Number: [id number] Patient Account Number: [id number] Date of Birth/Sex: Treating RN: 17-Apr-1946 (77 y.o. M) Primary Care Chardonnay Holzmann: [name] Other Clinician: Referring Izaya Netherton: Treating  Jayden Rudge/Extender: [name]: 32 Vital Signs Time Taken: 14:27 Temperature (F): 98.2 Height (in): 74 Pulse (bpm): 81 Weight (lbs): 250 Respiratory Rate (breaths/min): 18 Body Mass Index (BMI): 32.1 Blood Pressure (mmHg): 156/64 Reference Range: 80 - 120 mg / dl Electronic Signature(s) Signed: 11/07/2022 2:31:37 PM By: [name] Entered By: [name] on 11/05/2022 14:27:38

## 2022-11-12 ENCOUNTER — Ambulatory Visit (HOSPITAL_BASED_OUTPATIENT_CLINIC_OR_DEPARTMENT_OTHER): Payer: Medicare Other | Admitting: Physician Assistant

## 2022-11-19 ENCOUNTER — Encounter (HOSPITAL_BASED_OUTPATIENT_CLINIC_OR_DEPARTMENT_OTHER): Payer: Medicare Other | Attending: Physician Assistant | Admitting: Physician Assistant

## 2022-11-19 DIAGNOSIS — I5032 Chronic diastolic (congestive) heart failure: Secondary | ICD-10-CM | POA: Diagnosis not present

## 2022-11-19 DIAGNOSIS — Z7901 Long term (current) use of anticoagulants: Secondary | ICD-10-CM | POA: Diagnosis not present

## 2022-11-19 DIAGNOSIS — I890 Lymphedema, not elsewhere classified: Secondary | ICD-10-CM | POA: Insufficient documentation

## 2022-11-19 DIAGNOSIS — L84 Corns and callosities: Secondary | ICD-10-CM | POA: Insufficient documentation

## 2022-11-19 DIAGNOSIS — I872 Venous insufficiency (chronic) (peripheral): Secondary | ICD-10-CM | POA: Insufficient documentation

## 2022-11-19 DIAGNOSIS — L97828 Non-pressure chronic ulcer of other part of left lower leg with other specified severity: Secondary | ICD-10-CM | POA: Diagnosis not present

## 2022-11-19 DIAGNOSIS — L97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 other part of right lower leg with other specified severity: Secondary | ICD-10-CM | POA: Diagnosis not present

## 2022-11-19 DIAGNOSIS — I130 Hypertensive heart and chronic kidney disease with heart failure and stage 1 through stage 4 chronic kidney disease, or unspecified chronic kidney disease: Secondary | ICD-10-CM | POA: Insufficient documentation

## 2022-11-19 DIAGNOSIS — I87333 Chronic venous hypertension (idiopathic) with ulcer and inflammation of bilateral lower extremity: Secondary | ICD-10-CM | POA: Insufficient documentation

## 2022-11-19 NOTE — Progress Notes (Addendum)
Klinge, [name] ([id number]) [id number] Page 1 of 14 Visit Report for 11/19/2022 Chief Complaint Document Details Patient Name: Date of Service: [name], [name] 11/19/2022 2:00 PM Medical Record Number: [id number] Patient Account Number: [id number] Date of Birth/Sex: Treating RN: 14-Jul-1945 (77 y.o. M) Primary Care Provider: [name] Other Clinician: Referring Provider: Treating Provider/Extender: [name] Weeks in Treatment: 52 Information Obtained from: Patient Chief Complaint 02/23/2020; patient is here for wounds on his bilateral lower legs in the setting of severe lymphedema 03/26/2022; patient is here for wounds on his bilateral lower legs medial aspect Electronic Signature(s) Signed: 11/19/2022 3:03:25 PM By: [name] PA-C Entered By: [name] on 11/19/2022 15:03:25 -------------------------------------------------------------------------------- Debridement Details Patient Name: Date of Service: Ballowe, [name] 11/19/2022 2:00 PM Medical Record Number: [id number] Patient Account Number: [id number] Date of Birth/Sex: Treating RN: June 05, 1945 (77 y.o. M) Primary Care Provider: [name] Other Clinician: Referring Provider: Treating Provider/Extender: [name] in Treatment: 34 Debridement Performed for Assessment: Wound #11 Left,Posterior Lower Leg Performed By: Physician [name], PA Debridement Type: Debridement Level of Consciousness (Pre-procedure): Awake and Alert Pre-procedure Verification/Time Out Yes - 15:08 Taken: Start Time: 15:08 Pain Control: Lidocaine 5% topical ointment Percent of Wound Bed Debrided: 100% T Area Debrided (cm): otal 0.12 Tissue and other material debrided: Viable, Non-Viable, Slough, Subcutaneous, Slough Level: Skin/Subcutaneous Tissue Debridement Description: Excisional Instrument: Curette Bleeding: Minimum Hemostasis Achieved: Pressure End  Time: 15:10 Procedural Pain: 0 Post Procedural Pain: 0 Response to Treatment: Procedure was tolerated well Level of Consciousness (Post- Awake and Alert procedure): Post Debridement Measurements of Total Wound Length: (cm) 0.5 Width: (cm) 0.3 Depth: (cm) 0.1 Volume: (cm) 0.012 Character of Wound/Ulcer Post Debridement: Improved Inge, Rylynn ([id number]) [id number] Page 2 of 14 Post Procedure Diagnosis Same as Pre-procedure Notes Scribed for [name] PA, by [employer]) Signed: 11/19/2022 3:25:16 PM By: [name] PA-C Entered By: [name] on 11/19/2022 15:25:16 -------------------------------------------------------------------------------- Debridement Details Patient Name: Date of Service: [name], [name] 11/19/2022 2:00 PM Medical Record Number: [id number] Patient Account Number: [id number] Date of Birth/Sex: Treating RN: 06-Aug-1945 (78 y.o. M) Primary Care Provider: [name] Other Clinician: Referring Provider: Treating Provider/Extender: [name] in Treatment: 34 Debridement Performed for Assessment: Wound #7 Left,Medial Lower Leg Performed By: Physician [name], PA Debridement Type: Debridement Level of Consciousness (Pre-procedure): Awake and Alert Pre-procedure Verification/Time Out Yes - 15:08 Taken: Start Time: 15:08 Pain Control: Lidocaine 5% topical ointment Percent of Wound Bed Debrided: 100% T Area Debrided (cm): otal 10.05 Tissue and other material debrided: Viable, Non-Viable, Slough, Subcutaneous, Slough Level: Skin/Subcutaneous Tissue Debridement Description: Excisional Instrument: Curette Bleeding: Minimum Hemostasis Achieved: Pressure End Time: 15:10 Procedural Pain: 0 Post Procedural Pain: 0 Response to Treatment: Procedure was tolerated well Level of Consciousness (Post- Awake and Alert procedure): Post Debridement Measurements of Total  Wound Length: (cm) 3.2 Width: (cm) 4 Depth: (cm) 0.3 Volume: (cm) 3.016 Character of Wound/Ulcer Post Debridement: Improved Post Procedure Diagnosis Same as Pre-procedure Notes Scribed for [name] PA, by [employer]) Signed: 11/19/2022 3:25:35 PM By: [name] PA-C Entered By: [name] on 11/19/2022 15:25:35 Baranek, [name] ([id number]) [id number] Page 3 of 14 -------------------------------------------------------------------------------- Debridement Details Patient Name: Date of Service: [name], [name] 11/19/2022 2:00 PM Medical Record Number: [id number] Patient Account Number: [id number] Date of Birth/Sex: Treating RN: 06-05-1945 (77 y.o. M) Primary Care Provider: [name] Other Clinician: Referring Provider: Treating Provider/Extender:  Stone III, Rico Junker, Ajay S Weeks in Treatment: 34 Debridement Performed for Assessment: Wound #6 Right,Medial Lower Leg Performed By: Physician [name], PA Debridement Type: Debridement Level of Consciousness (Pre-procedure): Awake and Alert Pre-procedure Verification/Time Out Yes - 15:08 Taken: Start Time: 15:08 Pain Control: Lidocaine 5% topical ointment Percent of Wound Bed Debrided: 25% T Area Debrided (cm): otal 11.48 Tissue and other material debrided: Viable, Non-Viable, Slough, Subcutaneous, Slough Level: Skin/Subcutaneous Tissue Debridement Description: Excisional Instrument: Curette Bleeding: Minimum Hemostasis Achieved: Pressure End Time: 15:10 Procedural Pain: 0 Post Procedural Pain: 0 Response to Treatment: Procedure was tolerated well Level of Consciousness (Post- Awake and Alert procedure): Post Debridement Measurements of Total Wound Length: (cm) 9 Width: (cm) 6.5 Depth: (cm) 0.2 Volume: (cm) 9.189 Character of Wound/Ulcer Post Debridement: Improved Post Procedure Diagnosis Same as Pre-procedure Notes Scribed for [employer], by  [employer]) Signed: 11/19/2022 3:26:26 PM By: [name] PA-C Previous Signature: 11/19/2022 3:25:24 PM Version By: [name] PA-C Entered By: [name] on 11/19/2022 15:26:26 -------------------------------------------------------------------------------- HPI Details Patient Name: Date of Service: [name], [name] 11/19/2022 2:00 PM Medical Record Number: [id number] Patient Account Number: [id number] Date of Birth/Sex: Treating RN: [birth year] (77 y.o. M) Primary Care Provider: [name] Other Clinician: Referring Provider: Treating Provider/Extender: [name] Weeks in Treatment: 38 History of Present Illness HPI Description: ADMISSION 02/23/2020 Patient is a 77 year old man who lives in [place] who arrives accompanied by his wife. He has a history of chronic lymphedema and venous insufficiency in his bilateral lower legs which may have something to do that with having a history of DVT as well as being treated for prostate cancer. In any case he recently got compression pumps at home but compliance has been an issue here. He has compression stockings however they are probably not sufficient enough to control swelling. Enrico, [name] ([id number]) [id number] Page 4 of 14 They tell us that things deteriorated for him in late August he was admitted to [facility] for 7 days. This was with cellulitis I think of his bilateral lower legs. Discharge he was noted to have wounds on his bilateral lower legs. He was discharged on Bactrim. They tried to get him home health through [facility] of course they declined him. His wife is been wrapping these applying some form of silver foam dressing. He has a history of wounds before although nothing that would not heal with basic home topical dressings. He has 2 areas on the left medial, left anterior and left lateral and a smaller area on the right  medial. All of these have considerable depth. Past medical history includes iron deficiency anemia, lymphedema followed by the rehab center at [facility] with lymphedema wraps I believe, DVT on chronic anticoagulation, prostate cancer, chronic venous insufficiency, hypertension. As mentioned he has compression pumps but does not use them. ABIs in our clinic were noncompressible bilaterally 10/14; patient with severe bilateral lymphedema right greater than left. He came in with bilateral lower extremity wounds left greater than right. Even though the right side has more of the edema most of the wounds here almost closed on the right medial. He has 3 remaining wounds on the left We have been using silver alginate under 4-layer compression I have been trying to get him to be compliant with his external compression pumps 10/21; patient with 3 small wounds on the left leg and 1 on the right medial in the setting of severe  lymphedema and chronic venous insufficiency. We have been using silver alginate under 4-layer compression he is using his external compression pumps twice a day 11/4; ARTERIAL STUDIES on the right show an ABI of 1.02 TBI of 0.858 with biphasic waveforms on the left 0.98 with a TBI of 0.55 and biphasic waveforms. Does not look like he has significant arterial disease. We are treating him for lymphedema he has compression pumps. He has punched-out areas on the left anterior left lateral and right medial lower extremities 11/11; after we obtained his arterial studies I put him in 4 layer compression. He is using his compression pumps probably once a day although I have asked him to do twice. Primary dressing to the wound is silver collagen he has severe lymphedema likely secondary to chronic venous insufficiency. Wounds on the left lateral, left medial and left anterior and a small area on the right medial 12/2; the area on the right anterior lower leg has healed. We initially thought that  the area medially had healed as well however when her discharge nurse came in she detected fluid in the wound simply opened up. This is actually worse than I remember this pain. The area on the left lateral potentially slightly smaller He is also complaining about pain in his left hand he says that this is actually been getting some better he has been using topical creams on this. She asked that I look at this 12/9 after last weeks issues we have 2 wounds one on the right medial lower leg and 1 on the left lateral. Both of these are in the same condition. I think because of thickened skin secondary to chronic lymphedema these wounds actually have depth of almost 0.8 cm. 12/16; the patient has 2 small but deep wounds one on the right medial and one on the left lateral. The right medial is actually the worst of these. He arrives in clinic today with absolutely terrible edema in the right leg apparently his 4-layer wrap fell down to just above his ankle he did not think about this he is apparently been continuing to use his compression pump twice a day. The left leg looks a lot better. 05/09/2020 upon evaluation today patient appears to be doing decently well in regard to his wounds. Everything is measuring smaller the right leg still has a little bit deeper wound in the left seems to be almost completely healed in my opinion I am very pleased in general with how things are progressing. He has a 4- layer compression wrap we have been using endoform today we will probably have to use collagen just based on the fact that we do not have endoform it is on order. 1/6; the patient's wound on the left lateral lower leg has healed. Still has 1 on the right medial. 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 Using compression pumps at home twice a day. 1/13; left lateral lower leg is still healed. He has a deep punched out rectangular shaped wound on the right medial calf. Looking down at this it  appears that he is attempting to epithelialize around the edges of the wound and on the base as well. His edema is reasonably well controlled we have been using collagen with absolutely no effect 1/20; left lateral lower leg remains closed he has extremitease stockings. The area on the right medial calf I aggressively debrided last week measures larger but the surface looks better. We have been using Hydrofera Blue. We ran [facility]  through his insurance but we have not seen the results of this 1/27; left lower leg wound with chronic venous insufficiency and secondary lymphedema. I did aggressive debridement on this last week the wound seems to have come in healthy looking surface using Hydrofera Blue. He was denied for [facility] 2/3; small divot in the right medial lower leg. Under illumination the walls of this divot are epithelialized however the base has slough which I removed with a curette we have been using Hydrofera Blue 2/10 small divot on the right medial lower leg pinpoint illumination at the base of this cone-shaped wound. We have been using Hydrofera Blue but I will switch to calcium alginate this week 2/17; the small divot on the right medial lower leg is fully epithelialized. There is no visible open area under illumination. He has his own stocking for the right leg similar to the one he has been wearing on the left. 03/26/2022; READMISSION This is a now 77 year old man that we had in the clinic from 02/23/2020 through 07/05/2020. At that point he had bilateral lower extremity wounds left greater than right in the setting of severe lymphedema. He had already obtained compression pumps ordered for him I think from the wound care clinic in [facility] so I do not really have record of what he has been using. He claims to be using them once a day but there is a problem with the sleeve on the left leg. About 2 weeks ago he was hospitalized from 03/11/2022 through 03/14/2022 with diastolic  congestive heart failure. 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 He was diuresed. Developed some prerenal azotemia and he has not been taking any diuretics currently. He has not been putting stockings on his legs since he got out of hospital and still has his legs dependent for long periods. Past medical history history of prostate cancer treated with prostatectomy and radiation this was apparently about 8 years ago, history of DVT on chronic Coumadin, history of lymphedema was managed for a while at the clinic in [place]. History of inguinal hernia repair in September 22, hypertension, stage IIIb chronic renal failure ABIs today were noncompressible on the right 1.12 on the left 04-02-2022 upon evaluation today patient appears to be doing well currently in regard to his legs I do feel like both areas that are draining are actually much drier than they were in the picture last week although the left is drier than the right. He is tolerating the 4-layer compression wraps at this point he did contact the pump company and they are actually working on getting him a new compression sleeve for one of his legs which have previously popped and was not functioning properly. 04-23-2022 upon evaluation today patient appears to be doing well currently in regard to his wounds on the legs. I am actually very pleased with where things stand and I do feel like that we are headed in the right direction. Fortunately there is no sign of active infection locally or systemically at this time. 05-07-2022 upon evaluation today patient appears to be doing well currently in regard to his wounds in fact things are showing signs of improvement which is good news I do not see too much that actually appears to be open and I am very pleased in that regard. No fevers, chills, nausea, vomiting, or diarrhea. 05-21-2022 upon evaluation today patient appears to be doing somewhat poorly  in regard to drainage of his  lower extremities bilaterally. The right is greater than left as far as the weeping area. Nonetheless it seems to be getting worse not better. He actually has pitting edema which is at least 2+ to the thighs and I am concerned about the fact that he is may be fluid overloaded in general and that is the reason why we cannot get this under control. I know he is not using his pumps all the time because he actually told the nurse that he was either going to pump or he was going to use his fluid pills but not do both. For that reason I do think that he needs to be really doing both in order to get the fluid out as effectively as possible obviously with the 4-layer compression wraps were doing Whipp, [name] ([id number]) [id number] Page 5 of 14 as much as we can from a compression standpoint but it is really not enough. He tells me that he elevates his leg is much as he can in between pumping and other activity throughout the day. 05-28-2022 upon evaluation today patient appears to be doing better in regard to his wounds although the measurements may be a little bit larger this is a very difficult wound to heal it is very indistinct in a lot of areas. Nonetheless there is can be some need for sharp debridement in regard to both medial and lateral legs. Fortunately I see no signs of active infection locally nor systemically at this time. No fevers, chills, nausea, vomiting, or diarrhea. 06-04-2022 upon evaluation today patient appears to be doing poorly in general in regard to the wounds on his legs. He still continues to have a tremendous amount of fluid not just in the lower portion of his leg but to be honest his thigh where he has 2-3+ pitting edema in the thigh as well. Unfortunately I do not know that we will be able to get this healed effectively and keep it healed on the lower extremities unless he gets the overall fluid situation taking and  under control. Fortunately I do not see any signs of infection locally nor systemically which is great news. He just seems to be very fluid overloaded. 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 He has been tolerating the dressing changes with the compression wraps without complication. Fortunately I do not see any evidence of infection at this time which is great news. No fevers, chills, nausea, vomiting, or diarrhea. 06-18-2022 upon evaluation today patient appears to be doing well currently in regard to his wounds as far as not looking like they are terribly infected but nonetheless I am concerned about a subacute infection secondary to the fact that he continues to have spreading despite the compression therapy. We actually did do an Unna boot on him last week this is actually the first wrap that actually stayed up everything else has been sliding down quite significantly. Fortunately there does not appear to be any signs of infection systemically at this time. With that being said I do believe that locally there seems to be an issue going on here and again I Ernie Hew do a PCR culture to see what that shows also think that I am going to put him on a broad-spectrum antibiotic, doxycycline to see how that will help as well. He does tell me that coming into the clinic today that he was feeling short of breath like "he was about to have a  heart attack" because he was having such a hard time breathing. He says that he told this to Dr. Jodelle Green his cardiologist as well when he was evaluated in the past 1 to 2 weeks. 07-02-2022 upon evaluation today patient appears to be doing poorly currently in regard to his wound. He has been tolerating the dressing changes. Unfortunately he has not had any compression wraps on for the past week because he was unable to make it in for his appointment last week.  With that being said he has a significant amount of drainage he tells me has been using his pumps but despite this in the pumps he still has been draining quite a bit. The drainage is also somewhat purulent unfortunately. We did attempt to get in touch with his cardiologist last week unfortunately we were unable to get up with him I did advise that the patient needs to get in touch with him upon leaving today in order to make sure they know he is on the new antibiotics I am going to send him this will be Levaquin and Augmentin. 07-09-2022 upon evaluation today patient appears to be doing about the same in regard to his legs he may have just a slight amount of improvement with regard to the drainage probably Keystone topical antibiotics are helping in this regard to some degree. Fortunately there does not appear to be any signs of active infection systemically which is great news. No fevers, chills, nausea, vomiting, or diarrhea. 07-16-2022 upon evaluation today patient appears to be doing well currently in regard to his wound. He has been tolerating the dressing changes without complication. Fortunately there does not appear to be any signs of active infection locally nor systemically at this time. With that being said he cannot keep the wraps up he tells me on the left side he had to cut this down because it got too tight. He has been using his pumps but he is on the right side the wrap actually straight down causing some pushing around the central part of his leg just below the calf I think this is a bigger risk for him that help at this point. I think that we may need to try something different he should be getting his compression socks shortly he tells me they were ordered last Thursday. 3/6; ; this is a patient who lives in [place]. He has severe bilateral lymphedema. He has compression pumps, we have been using kerlix Ace wrap Keystone. He is changing the dressing. We do not have home  health. 08-06-2022 upon evaluation today patient appears to be doing a little better in regard to his wounds in general at this point. Fortunately there does not appear to be any signs of active infection locally nor systemically at this time which is great news and overall I am extremely pleased with where we stand today. 08-13-2022 upon evaluation today patient appears to actually be doing significantly better compared to last week. He actually did go to the hospital I told him that he needed to when he left here and he actually did go. With that being said they actually ended up admitting him he was having shortness of breath and I thought it might be related to congestive heart failure turns out he actually had a pulmonary embolism. Subsequently they were able to get him off of the Coumadin switching over to Eliquis to get things stabilized in that regard they also had them wrapped and got his swelling under control on his  legs he actually looks much better pretty much across the board at this point. I am very pleased in that regard. With that being said I am very happy that he finally went that could have been a very dangerous situation. 08-20-2022 upon evaluation today patient appears to be doing well currently in regard to his wound. Has been tolerating the dressing changes without complication. Fortunately there does not appear to be any signs of active infection locally nor systemically at this time. I think his legs are doing better there is some need for sharp debridement today. 08-27-2022 upon evaluation patient is actually making excellent progress. I am actually very pleased with where he stands and I think that he is moving in the right direction. In general I think that we are looking pretty good at the moment. 09-03-2022 upon evaluation today patient appears to be doing well currently in regard to his wound. He is actually tolerating dressing changes on the left and right leg without  complication. Fortunately I do not see any need for debridement of the left leg the right leg I think we probably do some need to perform some debridement here. 09-10-2022 upon evaluation today patient's wounds actually showed signs of improvement in both legs I do not see much is going require debridement today which was great news. Fortunately I do not see any evidence of infection which I think is also excellent he seems to be using his pumps and doing everything right I am happy about how this is progressing at this point. 09-17-2022 upon evaluation today patient appears to be doing decently well in regard to his wounds. He has been tolerating the dressing changes without complication. Fortunately there does not appear to be any signs of active infection at this time which is good news. 09-24-2022 upon evaluation today patient appears to be doing well currently in regard to his wounds. He has been tolerating the dressing changes without complication. Fortunately there does not appear to be any signs of active infection locally nor systemically which is great news. No fevers, chills, nausea, vomiting, or diarrhea. 10-08-2022 upon evaluation today patient appears to be doing excellent currently in regard to his wound. He has been tolerating the dressing changes without complication though it does not sound like he has been using his compression wraps for a bit here. He does think he was doing better with the [facility] topical antibiotics we can definitely go back to using that but I think the biggest issue here is that his swelling is just very out of control and needs to be under control. I discussed that with him today. 10-15-2022 upon evaluation today patient appears to be doing well currently in regard to his wounds. He is actually making some progress which is good news. Fortunately I do not see any evidence of active infection locally nor systemically which is great news as well. No fevers, chills,  nausea, vomiting, or diarrhea. He does have a callused area on the plantar aspect of his left foot which is actually causing some pain and he wonders if I can trim this down for him. 10-22-2022 upon evaluation today patient seems to be making progress. He is actually doing quite well and very pleased in that regard. I do not see any signs of active infection at this time. 11-05-2022 upon evaluation patient appears to be making progress although slowly towards closure. He seems to be doing well with regard to his legs were still using the [facility] topical antibiotics and  he seems to be doing quite well. He is going require some sharp debridement today. Mclennan, [name] ([id number]) [id number] Page 6 of 14 11-19-2022 upon evaluation today patient unfortunately appears to be extremely swollen at this point. He tells me that he ran out of supplies he also tells me his leg started leaking more because of not having supplies he was unable to wear his compression wraps that is the juxta fit compression wraps. Therefore his legs are extremely swollen much larger than normal and do not appear to be doing well at all today. He is going require some debridement I also think he is going require [place] to perform compression wrapping today. Electronic Signature(s) Signed: 11/19/2022 3:20:15 PM By: [name] PA-C Entered By: [name] on 11/19/2022 15:20:15 -------------------------------------------------------------------------------- Physical Exam Details Patient Name: Date of Service: Mentor, [name] 11/19/2022 2:00 PM Medical Record Number: [id number] Patient Account Number: [id number] Date of Birth/Sex: Treating RN: 05/07/1946 (77 y.o. M) Primary Care Provider: [name] Other Clinician: Referring Provider: Treating Provider/Extender: [name] Weeks in Treatment: 42 Constitutional Well-nourished and well-hydrated in no acute  distress. Respiratory normal breathing without difficulty. Psychiatric this patient is able to make decisions and demonstrates good insight into disease process. Alert and Oriented x 3. pleasant and cooperative. Notes Upon inspection patient's legs are extremely tight lymphedema is very much out of control compared to normal. Fortunately I do not see any signs of worsening overall I think that he is making poor progress towards complete closure at this time due to the fact that he has some swelling. Electronic Signature(s) Signed: 11/19/2022 3:20:35 PM By: [name] PA-C Entered By: [name] on 11/19/2022 15:20:35 -------------------------------------------------------------------------------- Physician Orders Details Patient Name: Date of Service: Vertz, [name] 11/19/2022 2:00 PM Medical Record Number: [id number] Patient Account Number: [id number] Date of Birth/Sex: Treating RN: July 19, 1945 (77 y.o. [name]: [name] Other Clinician: Referring Provider: Treating Provider/Extender: [name] in Treatment: 68 Verbal / Phone Orders: No Diagnosis Coding ICD-10 Coding Code Description [phone] Chronic venous hypertension (idiopathic) with ulcer and inflammation of bilateral lower extremity I89.0 Lymphedema, not elsewhere classified 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 other part of right lower leg with other specified severity L84 Corns and callosities Follow-up Appointments ppointment in 1 week. [name] PA-C Wednesday Return A Schamberger, [name] ([id number]) [id number] Page 7 of 14 Other: - If so desired, take off leg wraps on Saturday and then place the juxtalites on the legs. Anesthetic (In clinic) Topical Lidocaine 5% applied to wound bed Bathing/ Shower/ Hygiene May shower and wash  wound with soap and water. - apply clean dressing after bathing. Do not soak legs in tub, may rinse in shower Edema Control - Lymphedema / SCD / Other Lymphedema Pumps. Use Lymphedema pumps on leg(s) 2-3 times a day for 45-60 minutes. If wearing any wraps or hose, do not remove them. Continue exercising as instructed. - *****pump 3 times a day for an hour each time.***** Elevate legs to the level of the heart or above for 30 minutes daily and/or when sitting for 3-4 times a day throughout the day. - +++ Important+++ Avoid standing for long periods of time. Exercise regularly Compression stocking or Garment 30-40 mm/Hg pressure to: - juxtafit Essentials Long for bilateral lower legs; quantity three juxtafit essentials per limb. Wound Treatment Wound #11 -  Lower Leg Wound Laterality: Left, Posterior Cleanser: Soap and Water 1 x Per Day/30 Days Discharge Instructions: May shower and wash wound with dial antibacterial soap and water prior to dressing change. Peri-Wound Care: Sween Lotion (Moisturizing lotion) 1 x Per Day/30 Days Discharge Instructions: Apply moisturizing lotion as directed Topical: Keystone 1 x Per Day/30 Days Discharge Instructions: On Hold -Do not use for now Prim Dressing: Maxorb Extra Ag+ Alginate Dressing, 4x4.75 (in/in) 1 x Per Day/30 Days ary Discharge Instructions: Apply to wound bed as instructed Secondary Dressing: ABD Pad, 5x9 1 x Per Day/30 Days Discharge Instructions: Apply over primary dressing as directed. Secondary Dressing: Woven Gauze Sponge, Non-Sterile 4x4 in 1 x Per Day/30 Days Discharge Instructions: Apply over primary dressing as directed. Compression Wrap: FourPress (4 layer compression wrap) 1 x Per Day/30 Days Discharge Instructions: Apply four layer compression as directed. May also use Urgo K2 compression system as alternative. Compression Wrap: Juxtafit 30-72mmHg 1 x Per Day/30 Days Discharge Instructions: On Hold for now (11/19/22) apply in the  morning and remove at night. Wound #6 - Lower Leg Wound Laterality: Right, Medial Cleanser: Soap and Water 1 x Per Day/30 Days Discharge Instructions: May shower and wash wound with dial antibacterial soap and water prior to dressing change. Peri-Wound Care: Sween Lotion (Moisturizing lotion) 1 x Per Day/30 Days Discharge Instructions: Apply moisturizing lotion as directed Topical: Keystone 1 x Per Day/30 Days Discharge Instructions: On hold for now. Prim Dressing: Maxorb Extra Ag+ Alginate Dressing, 4x4.75 (in/in) 1 x Per Day/30 Days ary Discharge Instructions: Apply to wound bed as instructed Secondary Dressing: ABD Pad, 5x9 1 x Per Day/30 Days Discharge Instructions: Apply over primary dressing as directed. Secondary Dressing: Woven Gauze Sponge, Non-Sterile 4x4 in 1 x Per Day/30 Days Discharge Instructions: Apply over primary dressing as directed. Compression Wrap: FourPress (4 layer compression wrap) 1 x Per Day/30 Days Discharge Instructions: Apply four layer compression as directed. May also use Urgo K2 compression system as alternative. Compression Wrap: Juxtafit 30-86mmHg 1 x Per Day/30 Days Discharge Instructions: On Hold (11/19/22) apply in the morning and remove at night. Wound #7 - Lower Leg Wound Laterality: Left, Medial Cleanser: Soap and Water 1 x Per Day/30 Days Discharge Instructions: May shower and wash wound with dial antibacterial soap and water prior to dressing change. Peri-Wound Care: Sween Lotion (Moisturizing lotion) 1 x Per Day/30 Days Discharge Instructions: Apply moisturizing lotion as directed Topical: Keystone 1 x Per Day/30 Days Discharge Instructions: On Hold for now Balkcom, [name] ([id number]) [id number] Page 8 of 14 Prim Dressing: Maxorb Extra Ag+ Alginate Dressing, 4x4.75 (in/in) 1 x Per Day/30 Days ary Discharge Instructions: Apply to wound bed as instructed Secondary Dressing: ABD Pad, 5x9 1 x Per Day/30 Days Discharge  Instructions: Apply over primary dressing as directed. Secondary Dressing: Woven Gauze Sponge, Non-Sterile 4x4 in 1 x Per Day/30 Days Discharge Instructions: Apply over primary dressing as directed. Compression Wrap: FourPress (4 layer compression wrap) 1 x Per Day/30 Days Discharge Instructions: Apply four layer compression as directed. May also use Urgo K2 compression system as alternative. Compression Wrap: Juxtafit 30-64mmHg 1 x Per Day/30 Days Discharge Instructions: On hold (11/19/22) apply in the morning and remove at night. Electronic Signature(s) Signed: 11/19/2022 3:56:42 PM By: [name] PA-C Signed: 11/19/2022 4:55:36 PM By: [name] RN Entered By: [name] on 11/19/2022 15:24:36 -------------------------------------------------------------------------------- Problem List Details Patient Name: Date of Service: Stump, [name] 11/19/2022 2:00 PM Medical Record Number: [id number] Patient Account Number: [id number] Date of Birth/Sex:  Treating RN: Mar 22, 1946 (77 y.o. M) Primary Care Provider: [name] Other Clinician: Referring Provider: Treating Provider/Extender: [name] in Treatment: 60 Active Problems ICD-10 Encounter Code Description Active Date MDM Diagnosis I87.333 Chronic venous hypertension (idiopathic) with ulcer and inflammation of 06/11/2022 No Yes bilateral lower extremity I89.0 Lymphedema, not elsewhere classified 03/26/2022 No Yes L97.828 Non-pressure chronic ulcer of other part of left lower leg with other specified 03/26/2022 No Yes severity 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 other part of right lower leg with other specified 03/26/2022 No Yes severity L84 Corns and callosities 10/15/2022 No Yes Inactive Problems Resolved Problems Electronic Signature(s) Signed: 11/19/2022 3:03:15 PM By: [name] PA-C Cousins, Jakyle ([id number]) PM By: [name] PA-C [id number] Page 9 of  14 Signed: 11/19/2022 3:03:15 Entered By: [name] on 11/19/2022 15:03:15 -------------------------------------------------------------------------------- Progress Note Details Patient Name: Date of Service: Steenson, [name] 11/19/2022 2:00 PM Medical Record Number: [id number] Patient Account Number: [id number] Date of Birth/Sex: Treating RN: 11/22/1945 (77 y.o. M) Primary Care Provider: [name] Other Clinician: Referring Provider: Treating Provider/Extender: [name] Weeks in Treatment: 72 Subjective Chief Complaint Information obtained from Patient 02/23/2020; patient is here for wounds on his bilateral lower legs in the setting of severe lymphedema 03/26/2022; patient is here for wounds on his bilateral lower legs medial aspect History of Present Illness (HPI) ADMISSION 02/23/2020 Patient is a 77 year old man who lives in [place] who arrives accompanied by his wife. He has a history of chronic lymphedema and venous insufficiency in his bilateral lower legs which may have something to do that with having a history of DVT as well as being treated for prostate cancer. In any case he recently got compression pumps at home but compliance has been an issue here. He has compression stockings however they are probably not sufficient enough to control swelling. They tell us that things deteriorated for him in late August he was admitted to [facility] for 7 days. This was with cellulitis I think of his bilateral lower legs. Discharge he was noted to have wounds on his bilateral lower legs. He was discharged on Bactrim. They tried to get him home health through [facility] of course they declined him. His wife is been wrapping these applying some form of silver foam dressing. He has a history of wounds before although nothing that would not heal with basic home topical dressings. He has 2 areas on the left medial, left anterior and left lateral  and a smaller area on the right medial. All of these have considerable depth. Past medical history includes iron deficiency anemia, lymphedema followed by the rehab center at [facility] with lymphedema wraps I believe, DVT on chronic anticoagulation, prostate cancer, chronic venous insufficiency, hypertension. As mentioned he has compression pumps but does not use them. ABIs in our clinic were noncompressible bilaterally 10/14; patient with severe bilateral lymphedema right greater than left. He came in with bilateral lower extremity wounds left greater than right. Even though the right side has more of the edema most of the wounds here almost closed on the right medial. He has 3 remaining wounds on the left We have been using silver alginate under 4-layer compression I have been trying to get him to be compliant with his external compression pumps 10/21; patient with 3 small wounds on the left leg and 1 on the right medial in the setting of severe lymphedema and chronic venous insufficiency. We  have been using silver alginate under 4-layer compression he is using his external compression pumps twice a day 11/4; ARTERIAL STUDIES on the right show an ABI of 1.02 TBI of 0.858 with biphasic waveforms on the left 0.98 with a TBI of 0.55 and biphasic waveforms. Does not look like he has significant arterial disease. We are treating him for lymphedema he has compression pumps. He has punched-out areas on the left anterior left lateral and right medial lower extremities 11/11; after we obtained his arterial studies I put him in 4 layer compression. He is using his compression pumps probably once a day although I have asked him to do twice. Primary dressing to the wound is silver collagen he has severe lymphedema likely secondary to chronic venous insufficiency. Wounds on the left lateral, left medial and left anterior and a small area on the right medial 12/2; the area on the right anterior lower leg has  healed. We initially thought that the area medially had healed as well however when her discharge nurse came in she detected fluid in the wound simply opened up. This is actually worse than I remember this pain. The area on the left lateral potentially slightly smaller He is also complaining about pain in his left hand he says that this is actually been getting some better he has been using topical creams on this. She asked that I look at this 12/9 after last weeks issues we have 2 wounds one on the right medial lower leg and 1 on the left lateral. Both of these are in the same condition. I think because of thickened skin secondary to chronic lymphedema these wounds actually have depth of almost 0.8 cm. 12/16; the patient has 2 small but deep wounds one on the right medial and one on the left lateral. The right medial is actually the worst of these. He arrives in clinic today with absolutely terrible edema in the right leg apparently his 4-layer wrap fell down to just above his ankle he did not think about this he is apparently been continuing to use his compression pump twice a day. The left leg looks a lot better. 05/09/2020 upon evaluation today patient appears to be doing decently well in regard to his wounds. Everything is measuring smaller the right leg still has a little bit deeper wound in the left seems to be almost completely healed in my opinion I am very pleased in general with how things are progressing. He has a 4- layer compression wrap we have been using endoform today we will probably have to use collagen just based on the fact that we do not have endoform it is on order. 1/6; the patient's wound on the left lateral lower leg has healed. Still has 1 on the right medial. 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 Using compression pumps at home twice a day. 1/13; left lateral lower leg is still healed. He has a deep punched out rectangular shaped wound on the right  medial calf. Looking down at this it appears that he is attempting to epithelialize around the edges of the wound and on the base as well. His edema is reasonably well controlled we have been using collagen with absolutely no effect 1/20; left lateral lower leg remains closed he has extremitease stockings. The area on the right medial calf I aggressively debrided last week measures larger but the surface looks better. We have been using Hydrofera Blue. We ran Oasis through his insurance but we have  not seen the results of this 1/27; left lower leg wound with chronic venous insufficiency and secondary lymphedema. I did aggressive debridement on this last week the wound seems to have come in healthy looking surface using Hydrofera Blue. He was denied for Oasis 2/3; small divot in the right medial lower leg. Under illumination the walls of this divot are epithelialized however the base has slough which I removed with a Jicha, Srihith ([id number]) [id number] Page 10 of 14 curette we have been using Hydrofera Blue 2/10 small divot on the right medial lower leg pinpoint illumination at the base of this cone-shaped wound. We have been using Hydrofera Blue but I will switch to calcium alginate this week 2/17; the small divot on the right medial lower leg is fully epithelialized. There is no visible open area under illumination. He has his own stocking for the right leg similar to the one he has been wearing on the left. 03/26/2022; READMISSION This is a now 77 year old man that we had in the clinic from 02/23/2020 through 07/05/2020. At that point he had bilateral lower extremity wounds left greater than right in the setting of severe lymphedema. He had already obtained compression pumps ordered for him I think from the wound care clinic in [facility] so I do not really have record of what he has been using. He claims to be using them once a day but there is a problem  with the sleeve on the left leg. About 2 weeks ago he was hospitalized from 03/11/2022 through 03/14/2022 with diastolic congestive heart failure. 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 He was diuresed. Developed some prerenal azotemia and he has not been taking any diuretics currently. He has not been putting stockings on his legs since he got out of hospital and still has his legs dependent for long periods. Past medical history history of prostate cancer treated with prostatectomy and radiation this was apparently about 8 years ago, history of DVT on chronic Coumadin, history of lymphedema was managed for a while at the clinic in [place]. History of inguinal hernia repair in September 22, hypertension, stage IIIb chronic renal failure ABIs today were noncompressible on the right 1.12 on the left 04-02-2022 upon evaluation today patient appears to be doing well currently in regard to his legs I do feel like both areas that are draining are actually much drier than they were in the picture last week although the left is drier than the right. He is tolerating the 4-layer compression wraps at this point he did contact the pump company and they are actually working on getting him a new compression sleeve for one of his legs which have previously popped and was not functioning properly. 04-23-2022 upon evaluation today patient appears to be doing well currently in regard to his wounds on the legs. I am actually very pleased with where things stand and I do feel like that we are headed in the right direction. Fortunately there is no sign of active infection locally or systemically at this time. 05-07-2022 upon evaluation today patient appears to be doing well currently in regard to his wounds in fact things are showing signs of improvement which is good news I do not see too much that actually appears to be open and I am very pleased in that regard. No  fevers, chills, nausea, vomiting, or diarrhea. 05-21-2022 upon evaluation today patient appears to be doing somewhat poorly in regard to drainage  of his lower extremities bilaterally. The right is greater than left as far as the weeping area. Nonetheless it seems to be getting worse not better. He actually has pitting edema which is at least 2+ to the thighs and I am concerned about the fact that he is may be fluid overloaded in general and that is the reason why we cannot get this under control. I know he is not using his pumps all the time because he actually told the nurse that he was either going to pump or he was going to use his fluid pills but not do both. For that reason I do think that he needs to be really doing both in order to get the fluid out as effectively as possible obviously with the 4-layer compression wraps were doing as much as we can from a compression standpoint but it is really not enough. He tells me that he elevates his leg is much as he can in between pumping and other activity throughout the day. 05-28-2022 upon evaluation today patient appears to be doing better in regard to his wounds although the measurements may be a little bit larger this is a very difficult wound to heal it is very indistinct in a lot of areas. Nonetheless there is can be some need for sharp debridement in regard to both medial and lateral legs. Fortunately I see no signs of active infection locally nor systemically at this time. No fevers, chills, nausea, vomiting, or diarrhea. 06-04-2022 upon evaluation today patient appears to be doing poorly in general in regard to the wounds on his legs. He still continues to have a tremendous amount of fluid not just in the lower portion of his leg but to be honest his thigh where he has 2-3+ pitting edema in the thigh as well. Unfortunately I do not know that we will be able to get this healed effectively and keep it healed on the lower extremities unless he gets the  overall fluid situation taking and under control. Fortunately I do not see any signs of infection locally nor systemically which is great news. He just seems to be very fluid overloaded. 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 He has been tolerating the dressing changes with the compression wraps without complication. Fortunately I do not see any evidence of infection at this time which is great news. No fevers, chills, nausea, vomiting, or diarrhea. 06-18-2022 upon evaluation today patient appears to be doing well currently in regard to his wounds as far as not looking like they are terribly infected but nonetheless I am concerned about a subacute infection secondary to the fact that he continues to have spreading despite the compression therapy. We actually did do an Unna boot on him last week this is actually the first wrap that actually stayed up everything else has been sliding down quite significantly. Fortunately there does not appear to be any signs of infection systemically at this time. With that being said I do believe that locally there seems to be an issue going on here and again I Ernie Hew do a PCR culture to see what that shows also think that I am going to put him on a broad-spectrum antibiotic, doxycycline to see how that will help as well. He does tell me that coming into the clinic today that he was feeling short of breath like "he was about to have a heart attack" because he was having  such a hard time breathing. He says that he told this to Dr. Jodelle Green his cardiologist as well when he was evaluated in the past 1 to 2 weeks. 07-02-2022 upon evaluation today patient appears to be doing poorly currently in regard to his wound. He has been tolerating the dressing changes. Unfortunately he has not had any compression wraps on for the past week because he was unable to make it  in for his appointment last week. With that being said he has a significant amount of drainage he tells me has been using his pumps but despite this in the pumps he still has been draining quite a bit. The drainage is also somewhat purulent unfortunately. We did attempt to get in touch with his cardiologist last week unfortunately we were unable to get up with him I did advise that the patient needs to get in touch with him upon leaving today in order to make sure they know he is on the new antibiotics I am going to send him this will be Levaquin and Augmentin. 07-09-2022 upon evaluation today patient appears to be doing about the same in regard to his legs he may have just a slight amount of improvement with regard to the drainage probably Keystone topical antibiotics are helping in this regard to some degree. Fortunately there does not appear to be any signs of active infection systemically which is great news. No fevers, chills, nausea, vomiting, or diarrhea. 07-16-2022 upon evaluation today patient appears to be doing well currently in regard to his wound. He has been tolerating the dressing changes without complication. Fortunately there does not appear to be any signs of active infection locally nor systemically at this time. With that being said he cannot keep the wraps up he tells me on the left side he had to cut this down because it got too tight. He has been using his pumps but he is on the right side the wrap actually straight down causing some pushing around the central part of his leg just below the calf I think this is a bigger risk for him that help at this point. I think that we may need to try something different he should be getting his compression socks shortly he tells me they were ordered last Thursday. 3/6; ; this is a patient who lives in [place]. He has severe bilateral lymphedema. He has compression pumps, we have been using kerlix Ace wrap Keystone. He is changing the  dressing. We do not have home health. 08-06-2022 upon evaluation today patient appears to be doing a little better in regard to his wounds in general at this point. Fortunately there does not appear to be any signs of active infection locally nor systemically at this time which is great news and overall I am extremely pleased with where we stand today. 08-13-2022 upon evaluation today patient appears to actually be doing significantly better compared to last week. He actually did go to the hospital I told him that he needed to when he left here and he actually did go. With that being said they actually ended up admitting him he was having shortness of breath and I thought it might be related to congestive heart failure turns out he actually had a pulmonary embolism. Subsequently they were able to get him off of the Coumadin switching over to Eliquis to get things stabilized in that regard they also had them wrapped and got his swelling under control on his legs he actually [name], [name] ([id number])  [id number] Page 11 of 14 looks much better pretty much across the board at this point. I am very pleased in that regard. With that being said I am very happy that he finally went that could have been a very dangerous situation. 08-20-2022 upon evaluation today patient appears to be doing well currently in regard to his wound. Has been tolerating the dressing changes without complication. Fortunately there does not appear to be any signs of active infection locally nor systemically at this time. I think his legs are doing better there is some need for sharp debridement today. 08-27-2022 upon evaluation patient is actually making excellent progress. I am actually very pleased with where he stands and I think that he is moving in the right direction. In general I think that we are looking pretty good at the moment. 09-03-2022 upon evaluation today patient appears to be doing well  currently in regard to his wound. He is actually tolerating dressing changes on the left and right leg without complication. Fortunately I do not see any need for debridement of the left leg the right leg I think we probably do some need to perform some debridement here. 09-10-2022 upon evaluation today patient's wounds actually showed signs of improvement in both legs I do not see much is going require debridement today which was great news. Fortunately I do not see any evidence of infection which I think is also excellent he seems to be using his pumps and doing everything right I am happy about how this is progressing at this point. 09-17-2022 upon evaluation today patient appears to be doing decently well in regard to his wounds. He has been tolerating the dressing changes without complication. Fortunately there does not appear to be any signs of active infection at this time which is good news. 09-24-2022 upon evaluation today patient appears to be doing well currently in regard to his wounds. He has been tolerating the dressing changes without complication. Fortunately there does not appear to be any signs of active infection locally nor systemically which is great news. No fevers, chills, nausea, vomiting, or diarrhea. 10-08-2022 upon evaluation today patient appears to be doing excellent currently in regard to his wound. He has been tolerating the dressing changes without complication though it does not sound like he has been using his compression wraps for a bit here. He does think he was doing better with the [facility] topical antibiotics we can definitely go back to using that but I think the biggest issue here is that his swelling is just very out of control and needs to be under control. I discussed that with him today. 10-15-2022 upon evaluation today patient appears to be doing well currently in regard to his wounds. He is actually making some progress which is good news. Fortunately I do not  see any evidence of active infection locally nor systemically which is great news as well. No fevers, chills, nausea, vomiting, or diarrhea. He does have a callused area on the plantar aspect of his left foot which is actually causing some pain and he wonders if I can trim this down for him. 10-22-2022 upon evaluation today patient seems to be making progress. He is actually doing quite well and very pleased in that regard. I do not see any signs of active infection at this time. 11-05-2022 upon evaluation patient appears to be making progress although slowly towards closure. He seems to be doing well with regard to his legs were still using the [facility] topical  antibiotics and he seems to be doing quite well. He is going require some sharp debridement today. 11-19-2022 upon evaluation today patient unfortunately appears to be extremely swollen at this point. He tells me that he ran out of supplies he also tells me his leg started leaking more because of not having supplies he was unable to wear his compression wraps that is the juxta fit compression wraps. Therefore his legs are extremely swollen much larger than normal and do not appear to be doing well at all today. He is going require some debridement I also think he is going require [place] to perform compression wrapping today. Objective Constitutional Well-nourished and well-hydrated in no acute distress. Vitals Time Taken: 2:35 PM, Height: 74 in, Weight: 250 lbs, BMI: 32.1, Temperature: 97.7 F, Pulse: 80 bpm, Respiratory Rate: 18 breaths/min, Blood Pressure: 166/65 mmHg. Respiratory normal breathing without difficulty. Psychiatric this patient is able to make decisions and demonstrates good insight into disease process. Alert and Oriented x 3. pleasant and cooperative. General Notes: Upon inspection patient's legs are extremely tight lymphedema is very much out of control compared to normal. Fortunately I do not see any signs of worsening overall I  think that he is making poor progress towards complete closure at this time due to the fact that he has some swelling. Integumentary (Hair, Skin) Wound #11 status is Open. Original cause of wound was Gradually Appeared. The date acquired was: 10/19/2022. The wound has been in treatment 4 weeks. The wound is located on the Left,Posterior Lower Leg. The wound measures 0.5cm length x 0.3cm width x 0.1cm depth; 0.118cm^2 area and 0.012cm^3 volume. There is Fat Layer (Subcutaneous Tissue) exposed. There is no tunneling or undermining noted. There is a medium amount of serosanguineous drainage noted. There is small (1-33%) pink granulation within the wound bed. There is a large (67-100%) amount of necrotic tissue within the wound bed including Adherent Slough. The periwound skin appearance had no abnormalities noted for texture. The periwound skin appearance had no abnormalities noted for color. The periwound skin appearance exhibited: Dry/Scaly. The periwound skin appearance did not exhibit: Maceration. Periwound temperature was noted as No Abnormality. Wound #6 status is Open. Original cause of wound was Gradually Appeared. The date acquired was: 03/05/2022. The wound has been in treatment 34 weeks. The wound is located on the Right,Medial Lower Leg. The wound measures 9cm length x 6.5cm width x 0.2cm depth; 45.946cm^2 area and 9.189cm^3 volume. There is Fat Layer (Subcutaneous Tissue) exposed. There is no tunneling or undermining noted. There is a medium amount of serosanguineous drainage noted. The wound margin is distinct with the outline attached to the wound base. There is small (1-33%) pink granulation within the wound bed. There is a large (67- 100%) amount of necrotic tissue within the wound bed including Adherent Slough. The periwound skin appearance exhibited: Scarring, Dry/Scaly, Hemosiderin Staining. The periwound skin appearance did not exhibit: Callus, Crepitus, Excoriation, Induration, Rash,  Maceration, Atrophie Blanche, Cyanosis, Ecchymosis, Mottled, Pallor, Rubor, Erythema. Wound #7 status is Open. Original cause of wound was Gradually Appeared. The date acquired was: 03/05/2022. The wound has been in treatment 34 weeks. The wound is located on the Left,Medial Lower Leg. The wound measures 3.2cm length x 4cm width x 0.3cm depth; 10.053cm^2 area and 3.016cm^3 volume. There is Fat Layer (Subcutaneous Tissue) exposed. There is no tunneling or undermining noted. There is a medium amount of serosanguineous drainage noted. Condie, [name] ([id number]) [id number] Page 12 of 14 The wound margin is distinct with  the outline attached to the wound base. There is small (1-33%) pink granulation within the wound bed. There is a large (67- 100%) amount of necrotic tissue within the wound bed including Adherent Slough. The periwound skin appearance exhibited: Scarring, Maceration, Hemosiderin Staining. The periwound skin appearance did not exhibit: Callus, Crepitus, Excoriation, Induration, Rash, Dry/Scaly, Atrophie Blanche, Cyanosis, Ecchymosis, Mottled, Pallor, Rubor, Erythema. 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 There was a Excisional Skin/Subcutaneous Tissue Debridement with a total area of 0.12 sq cm performed by [name], PA. With the following instrument(s): Curette to remove Viable and Non-Viable tissue/material. Material removed includes Subcutaneous Tissue and Slough and after achieving pain control using  Lidocaine 5% topical ointment. No specimens were taken. A time out was conducted at 15:08, prior to the start of the procedure. A Minimum amount of bleeding was controlled with Pressure. The procedure was tolerated well with a pain level of 0 throughout and a pain level of 0 following the procedure. Post Debridement Measurements: 0.5cm length x 0.3cm width x 0.1cm depth; 0.012cm^3 volume. Character of Wound/Ulcer Post Debridement is improved. Post procedure Diagnosis Wound #11: Same as Pre-Procedure General Notes: Scribed for [name] PA, by J.Scotton. Pre-procedure diagnosis of Wound #11 is a Lymphedema located on the Left,Posterior Lower Leg . There was a Four Layer Compression Therapy Procedure by [name], RN. Post procedure Diagnosis Wound #11: Same as Pre-Procedure Wound #6 Pre-procedure diagnosis of Wound #6 is a Lymphedema located on the Right,Medial Lower Leg . There was a Excisional Skin/Subcutaneous Tissue Debridement with a total area of 11.48 sq cm performed by [name], PA. With the following instrument(s): Curette to remove Viable and Non-Viable tissue/material. Material removed includes Subcutaneous Tissue and Slough and after achieving pain control using Lidocaine 5% topical ointment. No specimens were taken. A time out was conducted at 15:08, prior to the start of the procedure. A Minimum amount of bleeding was controlled with Pressure. The procedure was tolerated well with a pain level of 0 throughout and a pain level of 0 following the procedure. Post Debridement Measurements: 9cm length x 6.5cm width x 0.2cm depth; 9.189cm^3 volume. Character of Wound/Ulcer Post Debridement is improved. Post procedure Diagnosis Wound #6: Same as Pre-Procedure General Notes: Scribed for [employer] PA-C, by J.Scotton. Pre-procedure diagnosis of Wound #6 is a Lymphedema located on the Right,Medial Lower Leg . There was a Four Layer Compression Therapy Procedure by [name], RN. Post procedure Diagnosis Wound #6: Same as Pre-Procedure Wound #7 Pre-procedure diagnosis of Wound #7 is a Lymphedema located on the Left,Medial Lower Leg . There was a Excisional Skin/Subcutaneous Tissue Debridement with a total area of 10.05 sq cm performed by [name], PA. With the following instrument(s): Curette to remove Viable and Non-Viable tissue/material. Material removed includes Subcutaneous Tissue and Slough and after achieving pain control using Lidocaine 5% topical ointment. No specimens were taken. A time out was conducted at 15:08, prior to the start of the procedure. A Minimum amount of bleeding was controlled with Pressure. The procedure was tolerated well with a pain level of 0 throughout and a pain level of  0 following the procedure. Post Debridement Measurements: 3.2cm length x 4cm width x 0.3cm depth; 3.016cm^3 volume. Character of Wound/Ulcer Post Debridement is improved. Post procedure Diagnosis Wound #7: Same as Pre-Procedure General Notes: Scribed for [name] PA, by J.Scotton. Pre-procedure diagnosis of Wound #7 is a Lymphedema located on the Left,Medial Lower Leg . There was a Four Layer Compression Therapy Procedure by [name], RN. Post procedure Diagnosis Wound #7: Same as Pre-Procedure Plan Follow-up Appointments: Return Appointment in 1 week. [name] PA-C Wednesday Other: - If so desired, take off leg wraps on Saturday and then place the juxtalites on the legs. Anesthetic: (In clinic) Topical Lidocaine 5% applied to wound bed Bathing/ Shower/ Hygiene: May shower and wash wound with soap and water. - apply clean dressing after bathing. Do not soak legs in tub, may rinse in shower Edema Control - Lymphedema / SCD / Other: Jankowski, Jeson ([id number]) [id number] Page 13 of 14 Lymphedema Pumps. Use Lymphedema pumps on leg(s) 2-3 times a day for 45-60 minutes. If wearing any wraps or hose, do not remove  them. Continue exercising as instructed. - *****pump 3 times a day for an hour each time.***** Elevate legs to the level of the heart or above for 30 minutes daily and/or when sitting for 3-4 times a day throughout the day. - +++ Important+++ Avoid standing for long periods of time. Exercise regularly Compression stocking or Garment 30-40 mm/Hg pressure to: - juxtafit Essentials Long for bilateral lower legs; quantity three juxtafit essentials per limb. WOUND #11: - Lower Leg Wound Laterality: Left, Posterior Cleanser: Soap and Water 1 x Per Day/30 Days Discharge Instructions: May shower and wash wound with dial antibacterial soap and water prior to dressing change. Peri-Wound Care: Sween Lotion (Moisturizing lotion) 1 x Per Day/30 Days Discharge Instructions: Apply moisturizing lotion as directed Topical: Keystone 1 x Per Day/30 Days Discharge Instructions: On Hold -Do not use for now Prim Dressing: Maxorb Extra Ag+ Alginate Dressing, 4x4.75 (in/in) 1 x Per Day/30 Days ary Discharge Instructions: Apply to wound bed as instructed Secondary Dressing: ABD Pad, 5x9 1 x Per Day/30 Days Discharge Instructions: Apply over primary dressing as directed. Secondary Dressing: Woven Gauze Sponge, Non-Sterile 4x4 in 1 x Per Day/30 Days Discharge Instructions: Apply over primary dressing as directed. Com pression Wrap: FourPress (4 layer compression wrap) 1 x Per Day/30 Days Discharge Instructions: Apply four layer compression as directed. May also use Urgo K2 compression system as alternative. Com pression Wrap: Juxtafit 30-39mmHg 1 x Per Day/30 Days Discharge Instructions: On Hold for now (11/19/22) apply in the morning and remove at night. WOUND #6: - Lower Leg Wound Laterality: Right, Medial Cleanser: Soap and Water 1 x Per Day/30 Days Discharge Instructions: May shower and wash wound with dial antibacterial soap and water prior to dressing change. Peri-Wound Care: Sween Lotion (Moisturizing lotion) 1  x Per Day/30 Days Discharge Instructions: Apply moisturizing lotion as directed Topical: Keystone 1 x Per Day/30 Days Discharge Instructions: On hold for now. Prim Dressing: Maxorb Extra Ag+ Alginate Dressing, 4x4.75 (in/in) 1 x Per Day/30 Days ary Discharge Instructions: Apply to wound bed as instructed Secondary Dressing: ABD Pad, 5x9 1 x Per Day/30 Days Discharge Instructions: Apply over primary dressing as directed. Secondary Dressing: Woven Gauze Sponge, Non-Sterile 4x4 in 1 x Per Day/30 Days Discharge Instructions: Apply over primary dressing as directed. Com pression Wrap: FourPress (4 layer compression wrap) 1 x Per Day/30 Days Discharge Instructions: Apply four layer compression as directed. May also use  Urgo K2 compression system as alternative. Com pression Wrap: Juxtafit 30-71mmHg 1 x Per Day/30 Days Discharge Instructions: On Hold (11/19/22) apply in the morning and remove at night. WOUND #7: - Lower Leg Wound Laterality: Left, Medial Cleanser: Soap and Water 1 x Per Day/30 Days Discharge Instructions: May shower and wash wound with dial antibacterial soap and water prior to dressing change. Peri-Wound Care: Sween Lotion (Moisturizing lotion) 1 x Per Day/30 Days Discharge Instructions: Apply moisturizing lotion as directed Topical: Keystone 1 x Per Day/30 Days Discharge Instructions: On Hold for now Prim Dressing: Maxorb Extra Ag+ Alginate Dressing, 4x4.75 (in/in) 1 x Per Day/30 Days ary Discharge Instructions: Apply to wound bed as instructed Secondary Dressing: ABD Pad, 5x9 1 x Per Day/30 Days Discharge Instructions: Apply over primary dressing as directed. Secondary Dressing: Woven Gauze Sponge, Non-Sterile 4x4 in 1 x Per Day/30 Days Discharge Instructions: Apply over primary dressing as directed. Com pression Wrap: FourPress (4 layer compression wrap) 1 x Per Day/30 Days Discharge Instructions: Apply four layer compression as directed. May also use Urgo K2 compression  system as alternative. Com pression Wrap: Juxtafit 30-35mmHg 1 x Per Day/30 Days Discharge Instructions: On hold (11/19/22) apply in the morning and remove at night. 1. I would recommend that we actually have the patient go ahead and continuation of treatment with the and initiate silver alginate dressing I think also using the Zetuvit's over top is good to be the right way to go. 2. I am also can recommend that we go ahead and put him in a 4-layer compression wrap which I think is going to be helpful at this point to try to get some of the swelling under control. Right now things are just way out of control to the point that only thing the juxta fit wraps we have doing a good at the moment. 3. I am also can recommend he needs to be elevating his legs, taking his fluid pills, and being very diligent about wearing and using his lymphedema pumps. We will see patient back for reevaluation in 1 week here in the clinic. If anything worsens or changes patient will contact our office for additional recommendations. Electronic Signature(s) Signed: 11/19/2022 3:26:38 PM By: [name] PA-C Previous Signature: 11/19/2022 3:25:48 PM Version By: [name] PA-C Previous Signature: 11/19/2022 3:21:19 PM Version By: [name] PA-C Entered By: [name] on 11/19/2022 15:26:38 -------------------------------------------------------------------------------- SuperBill Details Patient Name: Date of Service: Flagg, [name] 11/19/2022 Medical Record Number: [id number] Patient Account Number: [id number] Gott, [name] ([id number]) [id number] Page 14 of 14 Date of Birth/Sex: Treating RN: October 29, 1945 (77 y.o. M) Primary Care Provider: [name] Other Clinician: Referring Provider: Treating Provider/Extender: [name] Weeks in Treatment: 34 Diagnosis Coding ICD-10 Codes Code Description [phone] Chronic venous hypertension (idiopathic) with ulcer and  inflammation of bilateral lower extremity I89.0 Lymphedema, not elsewhere classified 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 other part of right lower leg with other specified severity L84 Corns and callosities Facility Procedures : CPT4 Code: [id number] Description: 11042 - DEB SUBQ TISSUE 20 SQ CM/< ICD-10 Diagnosis Description L97.828 Non-pressure chronic ulcer of other part of left lower leg with other specified 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 other part of right lower leg with other specified Modifier: severity severity Quantity: 1 : CPT4 Code: [id number] Description: 11045 - DEB SUBQ TISS EA ADDL 20CM ICD-10 Diagnosis Description L97.828 Non-pressure chronic ulcer of other  part of left lower leg with other specified 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 other part of right lower leg with other specified Modifier: severity severity Quantity: 1 Physician Procedures : CPT4 Code Description Modifier [id number] 11042 - WC PHYS SUBQ TISS 20 SQ CM ICD-10 Diagnosis Description 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 Quantity: 1 : [id number] 11045 - WC PHYS SUBQ TISS EA ADDL 20 CM ICD-10 Diagnosis Description 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 other part of right lower leg with other  specified severity Quantity: 1 Electronic Signature(s) Signed: 11/19/2022 3:27:22 PM By: [name] PA-C Entered By: [name] on 11/19/2022 15:27:22

## 2022-11-19 NOTE — Progress Notes (Signed)
Doerr, [name] ([id number]) [id number] Page 1 of 10 Visit Report for 11/19/2022 Arrival Information Details Patient Name: Date of Service: [name] 11/19/2022 2:00 PM Medical Record Number: [id number] Patient Account Number: [id number] Date of Birth/Sex: Treating RN: Nov 11, 1945 (77 y.o. M) Primary Care Jessye Imhoff: [name] Other Clinician: Referring Doloras Tellado: Treating Laraine Samet/Extender: [name] in Treatment: 20 Visit Information History Since Last Visit Added or deleted any medications: No Patient Arrived: Cane Any new allergies or adverse reactions: No Arrival Time: 14:17 Had a fall or experienced change in No Accompanied By: self activities of daily living that may affect Transfer Assistance: None risk of falls: Patient Identification Verified: Yes Signs or symptoms of abuse/neglect since last visito No Secondary Verification Process Completed: Yes Hospitalized since last visit: No Patient Requires Transmission-Based Precautions: No Implantable device outside of the clinic excluding No Patient Has Alerts: Yes cellular tissue based products placed in the center Patient Alerts: Patient on Blood Thinner since last visit: Right ABI in clinic [place] Has Dressing in Place as Prescribed: Yes Pain Present Now: Yes Electronic Signature(s) Signed: 11/19/2022 4:52:44 PM By: Thayer Dallas Entered By: Thayer Dallas on 11/19/2022 14:17:52 -------------------------------------------------------------------------------- Compression Therapy Details Patient Name: Date of Service: [name] 11/19/2022 2:00 PM Medical Record Number: [id number] Patient Account Number: [id number] Date of Birth/Sex: Treating RN: 01/11/1946 (77 y.o. [name] Primary Care Karra Pink: [name] Other Clinician: Referring Laurence Folz: Treating Dezaray Shibuya/Extender: [name] Weeks in Treatment: 45 Compression  Therapy Performed for Wound Assessment: Wound #11 Left,Posterior Lower [name], RN Compression Type: Four Layer Post Procedure Diagnosis Same as Pre-procedure Electronic Signature(s) Signed: 11/19/2022 4:55:36 PM By: [name] RN Entered By: [name] on 11/19/2022 15:17:33 [name] ([id number]) [id number] Page 2 of 10 -------------------------------------------------------------------------------- Compression Therapy Details Patient Name: Date of Service: [name] 11/19/2022 2:00 PM Medical Record Number: [id number] Patient Account Number: [id number] Date of Birth/Sex: Treating RN: 04/01/1946 (77 y.o. [name] Primary Care Jhade Berko: [name] Other Clinician: Referring Mindi Akerson: Treating Dvora Buitron/Extender: [name] Weeks in Treatment: 6 Compression Therapy Performed for Wound Assessment: Wound #6 Right,Medial Lower [name], RN Compression Type: Four Layer Post Procedure Diagnosis Same as Pre-procedure Electronic Signature(s) Signed: 11/19/2022 4:55:36 PM By: [name] RN Entered By: [name] on 11/19/2022 15:17:33 -------------------------------------------------------------------------------- Compression Therapy Details Patient Name: Date of Service: [name] 11/19/2022 2:00 PM Medical Record Number: [id number] Patient Account Number: [id number] Date of Birth/Sex: Treating RN: 1945/12/13 (77 y.o. [name] Primary Care Prisca Gearing: [name] Other Clinician: Referring Jjesus Dingley: Treating Caelan Branden/Extender: [name] Weeks in Treatment: 58 Compression Therapy Performed for Wound Assessment: Wound #7 Left,Medial Lower [name], RN Compression Type: Four Layer Post Procedure Diagnosis Same as Pre-procedure Electronic  Signature(s) Signed: 11/19/2022 4:55:36 PM By: [name] RN Entered By: [name] on 11/19/2022 15:17:33 -------------------------------------------------------------------------------- Encounter Discharge Information Details Patient Name: Date of Service: [name] 11/19/2022 2:00 PM Medical Record Number: [id number] Patient Account Number: [id number] Date of Birth/Sex: Treating RN: 13-Dec-1945 (77 y.o. [name] Primary Care Nikitta Sobiech: [name] Other Clinician: Referring Gaspar Fowle: Treating Tae Vonada/Extender: [name] Weeks in Treatment: 24 Encounter Discharge Information Items Post Procedure Vitals Discharge Condition: Stable Temperature (F): 97.7 Ambulatory Status: Cane Pulse (bpm): 80 Discharge Destination: Home Respiratory Rate (breaths/min): 18 Transportation: Private Auto Blood  Pressure (mmHg): 166/65 Accompanied By: spouse Schedule Follow-up Appointment: Yes Clinical Summary of Care: Patient Declined [name] ([id number]) [id number] Page 3 of 10 Electronic Signature(s) Signed: 11/19/2022 4:55:36 PM By: [name] RN Entered By: [name] on 11/19/2022 16:42:12 -------------------------------------------------------------------------------- Lower Extremity Assessment Details Patient Name: Date of Service: [name] 11/19/2022 2:00 PM Medical Record Number: [id number] Patient Account Number: [id number] Date of Birth/Sex: Treating RN: 02/05/1946 (77 y.o. M) Primary Care Brando Taves: [name] Other Clinician: Referring Dario Yono: Treating Davy Westmoreland/Extender: Leveda Anna, [name] Weeks in Treatment: 34 Edema Assessment Assessed: [Left: No] [Right: No] Edema: [Left: Yes] [Right: Yes] Calf Left: Right: Point of Measurement: 40 cm From Medial Instep 55 cm 65 cm Ankle Left: Right: Point of Measurement: 13 cm From Medial Instep 45.4 cm 42.2 cm Electronic  Signature(s) Signed: 11/19/2022 4:52:44 PM By: Thayer Dallas Entered By: Thayer Dallas on 11/19/2022 14:36:33 -------------------------------------------------------------------------------- Multi-Disciplinary Care Plan Details Patient Name: Date of Service: Gau, [name] 11/19/2022 2:00 PM Medical Record Number: [id number] Patient Account Number: [id number] Date of Birth/Sex: Treating RN: [birth year] (77 y.o. [name] Primary Care Kally Cadden: [name] Other Clinician: Referring Elner Seifert: Treating Louisa Favaro/Extender: [name] Weeks in Treatment: 54 Active Inactive Wound/Skin Impairment Nursing Diagnoses: Impaired tissue integrity Knowledge deficit related to ulceration/compromised skin integrity Goals: Patient will have a decrease in wound volume by X% from date: (specify in notes) Date Initiated: 03/26/2022 Target Resolution Date: 01/17/2023 Goal Status: Active Patient/caregiver will verbalize understanding of skin care regimen Date Initiated: 03/26/2022 Target Resolution Date: 01/17/2023 Goal Status: Active Ulcer/skin breakdown will have a volume reduction of 30% by week 4 [name], [name] ([id number]) [id number] Page 4 of 10 Date Initiated: 03/26/2022 Date Inactivated: 05/21/2022 Target Resolution Date: 05/17/2022 Unmet Reason: see wound Goal Status: Unmet measurement. Ulcer/skin breakdown will have a volume reduction of 50% by week 8 Date Initiated: 03/26/2022 Date Inactivated: 05/21/2022 Target Resolution Date: 05/17/2022 Unmet Reason: see wound Goal Status: Unmet measurement. Interventions: Assess patient/caregiver ability to obtain necessary supplies Assess patient/caregiver ability to perform ulcer/skin care regimen upon admission and as needed Assess ulceration(s) every visit Notes: Patient stated today, "I will take my fluid pill or pump not do both." [name] made aware. Electronic Signature(s) Signed:  11/19/2022 4:55:36 PM By: [name] RN Entered By: [name] on 11/19/2022 16:40:03 -------------------------------------------------------------------------------- Pain Assessment Details Patient Name: Date of Service: Bugge, [name] 11/19/2022 2:00 PM Medical Record Number: [id number] Patient Account Number: [id number] Date of Birth/Sex: Treating RN: 03-Nov-1945 (77 y.o. M) Primary Care Remmie Bembenek: [name] Other Clinician: Referring Beverley Sherrard: Treating Jakevion Arney/Extender: [name] in Treatment: 46 Active Problems Location of Pain Severity and Description of Pain Patient Has Paino Yes Site Locations Pain Location: Generalized Pain, Pain in Ulcers Rate the pain. Current Pain Level: 7 Pain Management and Medication Current Pain Management: Electronic Signature(s) Signed: 11/19/2022 4:52:44 PM By: Thayer Dallas Entered By: Thayer Dallas on 11/19/2022 14:18:35 Creson, [name] ([id number]) [id number] Page 5 of 10 -------------------------------------------------------------------------------- Patient/Caregiver Education Details Patient Name: Date of Service: Carstarphen, [name] 7/3/2024andnbsp2:00 PM Medical Record Number: [id number] Patient Account Number: [id number] Date of Birth/Gender: Treating RN: 06-Dec-1945 (77 y.o. [name] Primary Care Physician: [name] Other Clinician: Referring Physician: Treating Physician/Extender: [name] in Treatment: 50 Education Assessment Education Provided To: Patient Education Topics Provided Wound/Skin Impairment: Methods: Explain/Verbal Responses: Return demonstration correctly Electronic Signature(s) Signed: 11/19/2022 4:55:36 PM By: [name] RN Entered By:  [name] on 11/19/2022 16:40:19 -------------------------------------------------------------------------------- Wound Assessment Details Patient Name:  Date of Service: Liebert, [name] 11/19/2022 2:00 PM Medical Record Number: [id number] Patient Account Number: [id number] Date of Birth/Sex: Treating RN: December 28, 1945 (77 y.o. [name] Primary Care Kaelene Elliston: [name] Other Clinician: Referring Tierria Watson: Treating Joette Schmoker/Extender: [name] Weeks in Treatment: 34 Wound Status Wound Number: 11 Primary Lymphedema Etiology: Wound Location: Left, Posterior Lower Leg Wound Open Wounding Event: Gradually Appeared Status: Date Acquired: 10/19/2022 Comorbid Anemia, Lymphedema, Deep Vein Thrombosis, Hypertension, Weeks Of Treatment: 4 History: Received Radiation Clustered Wound: No Photos Wound Measurements Length: (cm) 0.5 Width: (cm) 0.3 [name], [name] ([id number]) Depth: (cm) 0 Area: (cm) Volume: (cm) % Reduction in Area: 99% % Reduction in Volume: 99% [id number] Page 6 of 10 .1 Epithelialization: Large (67-100%) 0.118 Tunneling: No 0.012 Undermining: No Wound Description Classification: Full Thickness Without Exposed Suppor Exudate Amount: Medium Exudate Type: Serosanguineous Exudate Color: red, brown t Structures Wound Bed Granulation Amount: Small (1-33%) Exposed Structure Granulation Quality: Pink Fascia Exposed: No Necrotic Amount: Large (67-100%) Fat Layer (Subcutaneous Tissue) Exposed: Yes Necrotic Quality: Adherent Slough Tendon Exposed: No Muscle Exposed: No Joint Exposed: No Bone Exposed: No Periwound Skin Texture Texture Color No Abnormalities Noted: Yes No Abnormalities Noted: Yes Moisture Temperature / Pain No Abnormalities Noted: No Temperature: No Abnormality Dry / Scaly: Yes Maceration: No Treatment Notes Wound #11 (Lower Leg) Wound Laterality: Left, Posterior Cleanser Soap and Water Discharge Instruction: May shower and wash wound with dial antibacterial soap and water prior to dressing change. Peri-Wound Care Sween Lotion  (Moisturizing lotion) Discharge Instruction: Apply moisturizing lotion as directed Topical Keystone Discharge Instruction: On Hold -Do not use for now Primary Dressing Maxorb Extra Ag+ Alginate Dressing, 4x4.75 (in/in) Discharge Instruction: Apply to wound bed as instructed Secondary Dressing ABD Pad, 5x9 Discharge Instruction: Apply over primary dressing as directed. Woven Gauze Sponge, Non-Sterile 4x4 in Discharge Instruction: Apply over primary dressing as directed. Secured With Compression Wrap FourPress (4 layer compression wrap) Discharge Instruction: Apply four layer compression as directed. May also use Urgo K2 compression system as alternative. Juxtafit 30-35mmHg Discharge Instruction: On Hold for now (11/19/22) apply in the morning and remove at night. Compression Stockings Add-Ons Electronic Signature(s) Signed: 11/19/2022 4:55:36 PM By: [name] RN Entered By: [name] on 11/19/2022 14:52:39 [name] [name] ([id number]) [id number] Page 7 of 10 -------------------------------------------------------------------------------- Wound Assessment Details Patient Name: Date of Service: Scantling, [name] 11/19/2022 2:00 PM Medical Record Number: [id number] Patient Account Number: [id number] Date of Birth/Sex: Treating RN: 03/05/1946 (77 y.o. [name] Primary Care Chenel Wernli: [name] Other Clinician: Referring Daysha Ashmore: Treating Fedor Kazmierski/Extender: [name] Weeks in Treatment: 34 Wound Status Wound Number: 6 Primary Lymphedema Etiology: Wound Location: Right, Medial Lower Leg Wound Open Wounding Event: Gradually Appeared Status: Date Acquired: 03/05/2022 Comorbid Anemia, Lymphedema, Deep Vein Thrombosis, Hypertension, Weeks Of Treatment: 34 History: Received Radiation Clustered Wound: Yes Photos Wound Measurements Length: (cm) Width: (cm) Depth: (cm) Clustered Quantity: Area: (cm) Volume:  (cm) 9 % Reduction in Area: 67.9% 6.5 % Reduction in Volume: 35.7% 0.2 Epithelialization: Medium (34-66%) 1 Tunneling: No 45.946 Undermining: No 9.189 Wound Description Classification: Full Thickness Without Exposed Sup Wound Margin: Distinct, outline attached Exudate Amount: Medium Exudate Type: Serosanguineous Exudate Color: red, brown port Structures Foul Odor After Cleansing: No Slough/Fibrino Yes Wound Bed Granulation Amount: Small (1-33%) Exposed Structure Granulation Quality: Pink Fascia Exposed: No Necrotic Amount: Large (67-100%) Fat Layer (Subcutaneous Tissue) Exposed: Yes  Necrotic Quality: Adherent Slough Tendon Exposed: No Muscle Exposed: No Joint Exposed: No Bone Exposed: No Periwound Skin Texture Texture Color No Abnormalities Noted: No No Abnormalities Noted: No Callus: No Atrophie Blanche: No Crepitus: No Cyanosis: No Excoriation: No Ecchymosis: No Induration: No Erythema: No Rash: No Hemosiderin Staining: Yes Scarring: Yes Mottled: No Pallor: No Moisture Rubor: No No Abnormalities Noted: No Dry / Scaly: Yes Maceration: No [name], [name] ([id number]) [id number] Page 8 of 10 Treatment Notes Wound #6 (Lower Leg) Wound Laterality: Right, Medial Cleanser Soap and Water Discharge Instruction: May shower and wash wound with dial antibacterial soap and water prior to dressing change. Peri-Wound Care Sween Lotion (Moisturizing lotion) Discharge Instruction: Apply moisturizing lotion as directed Topical Keystone Discharge Instruction: On hold for now. Primary Dressing Maxorb Extra Ag+ Alginate Dressing, 4x4.75 (in/in) Discharge Instruction: Apply to wound bed as instructed Secondary Dressing ABD Pad, 5x9 Discharge Instruction: Apply over primary dressing as directed. Woven Gauze Sponge, Non-Sterile 4x4 in Discharge Instruction: Apply over primary dressing as directed. Secured With Compression Wrap FourPress (4  layer compression wrap) Discharge Instruction: Apply four layer compression as directed. May also use Urgo K2 compression system as alternative. Juxtafit 30-38mmHg Discharge Instruction: On Hold (11/19/22) apply in the morning and remove at night. Compression Stockings Add-Ons Electronic Signature(s) Signed: 11/19/2022 4:55:36 PM By: [name] RN Entered By: [name] on 11/19/2022 14:53:22 -------------------------------------------------------------------------------- Wound Assessment Details Patient Name: Date of Service: Kawamoto, [name] 11/19/2022 2:00 PM Medical Record Number: [id number] Patient Account Number: [id number] Date of Birth/Sex: Treating RN: 15-Sep-1945 (77 y.o. [name] Primary Care Tamsen Reist: [name] Other Clinician: Referring Tremain Rucinski: Treating Brookelle Pellicane/Extender: [name] Weeks in Treatment: 34 Wound Status Wound Number: 7 Primary Lymphedema Etiology: Wound Location: Left, Medial Lower Leg Wound Open Wounding Event: Gradually Appeared Status: Date Acquired: 03/05/2022 Comorbid Anemia, Lymphedema, Deep Vein Thrombosis, Hypertension, Weeks Of Treatment: 34 History: Received Radiation Clustered Wound: Yes Photos Schartz, [name] ([id number]) [id number] Page 9 of 10 Wound Measurements Length: (cm) Width: (cm) Depth: (cm) Clustered Quantity: Area: (cm) Volume: (cm) 3.2 % Reduction in Area: 78.7% 4 % Reduction in Volume: 36% 0.3 Epithelialization: Medium (34-66%) 1 Tunneling: No 10.053 Undermining: No 3.016 Wound Description Classification: Full Thickness Without Exposed Sup Wound Margin: Distinct, outline attached Exudate Amount: Medium Exudate Type: Serosanguineous Exudate Color: red, brown port Structures Foul Odor After Cleansing: No Slough/Fibrino Yes Wound Bed Granulation Amount: Small (1-33%) Exposed Structure Granulation Quality: Pink Fascia Exposed: No Necrotic  Amount: Large (67-100%) Fat Layer (Subcutaneous Tissue) Exposed: Yes Necrotic Quality: Adherent Slough Tendon Exposed: No Muscle Exposed: No Joint Exposed: No Bone Exposed: No Periwound Skin Texture Texture Color No Abnormalities Noted: No No Abnormalities Noted: No Callus: No Atrophie Blanche: No Crepitus: No Cyanosis: No Excoriation: No Ecchymosis: No Induration: No Erythema: No Rash: No Hemosiderin Staining: Yes Scarring: Yes Mottled: No Pallor: No Moisture Rubor: No No Abnormalities Noted: No Dry / Scaly: No Maceration: Yes Treatment Notes Wound #7 (Lower Leg) Wound Laterality: Left, Medial Cleanser Soap and Water Discharge Instruction: May shower and wash wound with dial antibacterial soap and water prior to dressing change. Peri-Wound Care Sween Lotion (Moisturizing lotion) Discharge Instruction: Apply moisturizing lotion as directed Topical Keystone Discharge Instruction: On Hold for now Primary Dressing Maxorb Extra Ag+ Alginate Dressing, 4x4.75 (in/in) Discharge Instruction: Apply to wound bed as instructed Secondary Dressing ABD Pad, 5x9 Discharge Instruction: Apply over primary dressing as directed. Spanbauer, [name] ([id number]) [id number] Page 10 of 10 Woven Gauze Sponge, Non-Sterile  4x4 in Discharge Instruction: Apply over primary dressing as directed. Secured With Compression Wrap FourPress (4 layer compression wrap) Discharge Instruction: Apply four layer compression as directed. May also use Urgo K2 compression system as alternative. Juxtafit 30-76mmHg Discharge Instruction: On hold (11/19/22) apply in the morning and remove at night. Compression Stockings Add-Ons Electronic Signature(s) Signed: 11/19/2022 4:55:36 PM By: [name] RN Entered By: [name] on 11/19/2022 14:54:45 -------------------------------------------------------------------------------- Vitals Details Patient Name: Date of  Service: Dirks, [name] 11/19/2022 2:00 PM Medical Record Number: [id number] Patient Account Number: [id number] Date of Birth/Sex: Treating RN: Jan 18, 1946 (77 y.o. M) Primary Care Allante Whitmire: [name] Other Clinician: Referring Isaic Syler: Treating Jestina Stephani/Extender: [name] Weeks in Treatment: 67 Vital Signs Time Taken: 14:35 Temperature (F): 97.7 Height (in): 74 Pulse (bpm): 80 Weight (lbs): 250 Respiratory Rate (breaths/min): 18 Body Mass Index (BMI): 32.1 Blood Pressure (mmHg): 166/65 Reference Range: 80 - 120 mg / dl Electronic Signature(s) Signed: 11/19/2022 4:52:44 PM By: Thayer Dallas Entered By: Thayer Dallas on 11/19/2022 14:35:51

## 2022-11-26 ENCOUNTER — Encounter (HOSPITAL_BASED_OUTPATIENT_CLINIC_OR_DEPARTMENT_OTHER): Payer: Medicare Other | Admitting: Physician Assistant

## 2022-11-26 DIAGNOSIS — I87333 Chronic venous hypertension (idiopathic) with ulcer and inflammation of bilateral lower extremity: Secondary | ICD-10-CM | POA: Diagnosis not present

## 2022-11-26 NOTE — Progress Notes (Addendum)
Gimpel, [name] [name] ([id number]) [id number] Page 1 of 12 Visit Report for 11/26/2022 Chief Complaint Document Details Patient Name: Date of Service: [name] [name] 11/26/2022 2:00 PM Medical Record Number: [id number] Patient Account Number: [id number] Date of Birth/Sex: Treating RN: 19-Jul-1945 (77 y.o. Male) Primary Care Provider: [name] Other Clinician: Referring Provider: Treating Provider/Extender: [name] Weeks in Treatment: 69 Information Obtained from: Patient Chief Complaint 02/23/2020; patient is here for wounds on his bilateral lower legs in the setting of severe lymphedema 03/26/2022; patient is here for wounds on his bilateral lower legs medial aspect Electronic Signature(s) Signed: 11/26/2022 2:50:59 PM By: [name] PA-C Entered By: [name] on 11/26/2022 14:50:58 -------------------------------------------------------------------------------- HPI Details Patient Name: Date of Service: [name] [name] [name] 11/26/2022 2:00 PM Medical Record Number: [id number] Patient Account Number: [id number] Date of Birth/Sex: Treating RN: 1945-10-29 (77 y.o. Male) Primary Care Provider: [name] Other Clinician: Referring Provider: Treating Provider/Extender: [name] Weeks in Treatment: 35 History of Present Illness HPI Description: ADMISSION 02/23/2020 Patient is a 77 year old man who lives in [place] who arrives accompanied by his wife. He has a history of chronic lymphedema and venous insufficiency in his bilateral lower legs which may have something to do that with having a history of DVT as well as being treated for prostate cancer. In any case he recently got compression pumps at home but compliance has been an issue here. He has compression stockings however they are probably not sufficient enough to control swelling. They tell us that things deteriorated for him in late August he was  admitted [facility]. This was with cellulitis I think of his bilateral lower legs. Discharge he was noted to have wounds on his bilateral lower legs. He was discharged on Bactrim. They tried to get him home health through [facility] of course they declined him. His wife is been wrapping these applying some form of silver foam dressing. He has a history of wounds before although nothing that would not heal with basic home topical dressings. He has 2 areas on the left medial, left anterior and left lateral and a smaller area on the right medial. All of these have considerable depth. Past medical history includes iron deficiency anemia, lymphedema followed by the rehab center at [facility] with lymphedema wraps I believe, DVT on chronic anticoagulation, prostate cancer, chronic venous insufficiency, hypertension. As mentioned he has compression pumps but does not use them. ABIs in our clinic were noncompressible bilaterally 10/14; patient with severe bilateral lymphedema right greater than left. He came in with bilateral lower extremity wounds left greater than right. Even though the right side has more of the edema most of the wounds here almost closed on the right medial. He has 3 remaining wounds on the left We have been using silver alginate under 4-layer compression I have been trying to get him to be compliant with his external compression pumps 10/21; patient with 3 small wounds on the left leg and 1 on the right medial in the setting of severe lymphedema and chronic venous insufficiency. We have been using silver alginate under 4-layer compression he is using his external compression pumps twice a day 11/4; ARTERIAL STUDIES on the right show an ABI of 1.02 TBI of 0.858 with biphasic waveforms on the left 0.98 with a TBI of 0.55 and biphasic waveforms. Does not look like he has significant arterial disease. We are treating him for  lymphedema he has compression  pumps. He has punched-out areas on the left anterior left lateral and right medial lower extremities 11/11; after we obtained his arterial studies I put him in 4 layer compression. He is using his compression pumps probably once a day although I have asked Morelos, [name] [name] ([id number]) [phone].pdf Page 2 of 12 him to do twice. Primary dressing to the wound is silver collagen he has severe lymphedema likely secondary to chronic venous insufficiency. Wounds on the left lateral, left medial and left anterior and a small area on the right medial 12/2; the area on the right anterior lower leg has healed. We initially thought that the area medially had healed as well however when her discharge nurse came in she detected fluid in the wound simply opened up. This is actually worse than I remember this pain. The area on the left lateral potentially slightly smaller He is also complaining about pain in his left hand he says that this is actually been getting some better he has been using topical creams on this. She asked that I look at this 12/9 after last weeks issues we have 2 wounds one on the right medial lower leg and 1 on the left lateral. Both of these are in the same condition. I think because of thickened skin secondary to chronic lymphedema these wounds actually have depth of almost 0.8 cm. 12/16; the patient has 2 small but deep wounds one on the right medial and one on the left lateral. The right medial is actually the worst of these. He arrives in clinic today with absolutely terrible edema in the right leg apparently his 4-layer wrap fell down to just above his ankle he did not think about this he is apparently been continuing to use his compression pump twice a day. The left leg looks a lot better. 05/09/2020 upon evaluation today patient appears to be doing decently well in regard to his wounds. Everything is measuring smaller the right leg still has a little bit  deeper wound in the left seems to be almost completely healed in my opinion I am very pleased in general with how things are progressing. He has a 4- layer compression wrap we have been using endoform today we will probably have to use collagen just based on the fact that we do not have endoform it is on order. 1/6; the patient's wound on the left lateral lower leg has healed. Still has 1 on the right medial. 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 Using compression pumps at home twice a day. 1/13; left lateral lower leg is still healed. He has a deep punched out rectangular shaped wound on the right medial calf. Looking down at this it appears that he is attempting to epithelialize around the edges of the wound and on the base as well. His edema is reasonably well controlled we have been using collagen with absolutely no effect 1/20; left lateral lower leg remains closed he has extremitease stockings. The area on the right medial calf I aggressively debrided last week measures larger but the surface looks better. We have been using Hydrofera Blue. We ran Oasis through his insurance but we have not seen the results of this 1/27; left lower leg wound with chronic venous insufficiency and secondary lymphedema. I did aggressive debridement on this last week the wound seems to have come in healthy looking surface using Hydrofera Blue. He was denied for Oasis 2/3; small divot in the right medial lower leg.  Under illumination the walls of this divot are epithelialized however the base has slough which I removed with a curette we have been using Hydrofera Blue 2/10 small divot on the right medial lower leg pinpoint illumination at the base of this cone-shaped wound. We have been using Hydrofera Blue but I will switch to calcium alginate this week 2/17; the small divot on the right medial lower leg is fully epithelialized. There is no visible open area under illumination. He has his own  stocking for the right leg similar to the one he has been wearing on the left. 03/26/2022; READMISSION This is a now 77 year old man that we had in the clinic from 02/23/2020 through 07/05/2020. At that point he had bilateral lower extremity wounds left greater than right in the setting of severe lymphedema. He had already obtained compression pumps ordered for him I think from the wound care clinic in [facility] so I do not really have record of what he has been using. He claims to be using them once a day but there is a problem with the sleeve on the left leg. About 2 weeks ago he was hospitalized from 03/11/2022 through 03/14/2022 with diastolic congestive heart failure. 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 He was diuresed. Developed some prerenal azotemia and he has not been taking any diuretics currently. He has not been putting stockings on his legs since he got out of hospital and still has his legs dependent for long periods. Past medical history history of prostate cancer treated with prostatectomy and radiation this was apparently about 8 years ago, history of DVT on chronic Coumadin, history of lymphedema was managed for a while at the clinic in [place]. History of inguinal hernia repair in September 22, hypertension, stage IIIb chronic renal failure ABIs today were noncompressible on the right 1.12 on the left 04-02-2022 upon evaluation today patient appears to be doing well currently in regard to his legs I do feel like both areas that are draining are actually much drier than they were in the picture last week although the left is drier than the right. He is tolerating the 4-layer compression wraps at this point he did contact the pump company and they are actually working on getting him a new compression sleeve for one of his legs which have previously popped and was not functioning properly. 04-23-2022 upon evaluation today  patient appears to be doing well currently in regard to his wounds on the legs. I am actually very pleased with where things stand and I do feel like that we are headed in the right direction. Fortunately there is no sign of active infection locally or systemically at this time. 05-07-2022 upon evaluation today patient appears to be doing well currently in regard to his wounds in fact things are showing signs of improvement which is good news I do not see too much that actually appears to be open and I am very pleased in that regard. No fevers, chills, nausea, vomiting, or diarrhea. 05-21-2022 upon evaluation today patient appears to be doing somewhat poorly in regard to drainage of his lower extremities bilaterally. The right is greater than left as far as the weeping area. Nonetheless it seems to be getting worse not better. He actually has pitting edema which is at least 2+ to the thighs and I am concerned about the fact that he is may be fluid overloaded in general and that is the reason why  we cannot get this under control. I know he is not using his pumps all the time because he actually told the nurse that he was either going to pump or he was going to use his fluid pills but not do both. For that reason I do think that he needs to be really doing both in order to get the fluid out as effectively as possible obviously with the 4-layer compression wraps were doing as much as we can from a compression standpoint but it is really not enough. He tells me that he elevates his leg is much as he can in between pumping and other activity throughout the day. 05-28-2022 upon evaluation today patient appears to be doing better in regard to his wounds although the measurements may be a little bit larger this is a very difficult wound to heal it is very indistinct in a lot of areas. Nonetheless there is can be some need for sharp debridement in regard to both medial and lateral legs. Fortunately I see no signs  of active infection locally nor systemically at this time. No fevers, chills, nausea, vomiting, or diarrhea. 06-04-2022 upon evaluation today patient appears to be doing poorly in general in regard to the wounds on his legs. He still continues to have a tremendous amount of fluid not just in the lower portion of his leg but to be honest his thigh where he has 2-3+ pitting edema in the thigh as well. Unfortunately I do not know that we will be able to get this healed effectively and keep it healed on the lower extremities unless he gets the overall fluid situation taking and under control. Fortunately I do not see any signs of infection locally nor systemically which is great news. He just seems to be very fluid overloaded. 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 He has been tolerating the dressing changes with the compression wraps without complication. Fortunately I do not see any evidence of infection at this time which is great news. No fevers, chills, nausea, vomiting, or diarrhea. 06-18-2022 upon evaluation today patient appears to be doing well currently in regard to his wounds as far as not looking like they are terribly infected but nonetheless I am concerned about a subacute infection secondary to the fact that he continues to have spreading despite the compression therapy. We actually did do an Unna boot on him last week this is actually the first wrap that actually stayed up everything else has been sliding down quite significantly. Fortunately there does not appear to be any signs of infection systemically at this time. With that being said I do believe that locally there seems to be an issue going on here and again I [name] Hew do a PCR culture to see what that shows also think that I am going to put him on a broad-spectrum antibiotic, doxycycline to see how that will help as well.  He does tell me that coming into the clinic today that he was feeling short of breath like "he was about to have a heart attack" because he was having such a hard time breathing. He says that he told this to Dr. Jodelle Green his cardiologist as well when he was evaluated in the [place] ([id number]) [phone].pdf Page 3 of 12 past 1 to 2 weeks. 07-02-2022 upon evaluation today patient appears to be doing poorly currently in regard to his wound. He has been tolerating the dressing changes.  Unfortunately he has not had any compression wraps on for the past week because he was unable to make it in for his appointment last week. With that being said he has a significant amount of drainage he tells me has been using his pumps but despite this in the pumps he still has been draining quite a bit. The drainage is also somewhat purulent unfortunately. We did attempt to get in touch with his cardiologist last week unfortunately we were unable to get up with him I did advise that the patient needs to get in touch with him upon leaving today in order to make sure they know he is on the new antibiotics I am going to send him this will be Levaquin and Augmentin. 07-09-2022 upon evaluation today patient appears to be doing about the same in regard to his legs he may have just a slight amount of improvement with regard to the drainage probably Keystone topical antibiotics are helping in this regard to some degree. Fortunately there does not appear to be any signs of active infection systemically which is great news. No fevers, chills, nausea, vomiting, or diarrhea. 07-16-2022 upon evaluation today patient appears to be doing well currently in regard to his wound. He has been tolerating the dressing changes without complication. Fortunately there does not appear to be any signs of active infection locally nor systemically at this time. With that being said he cannot keep the wraps up he tells me  on the left side he had to cut this down because it got too tight. He has been using his pumps but he is on the right side the wrap actually straight down causing some pushing around the central part of his leg just below the calf I think this is a bigger risk for him that help at this point. I think that we may need to try something different he should be getting his compression socks shortly he tells me they were ordered last Thursday. 3/6; ; this is a patient who lives in [place]. He has severe bilateral lymphedema. He has compression pumps, we have been using kerlix Ace wrap Keystone. He is changing the dressing. We do not have home health. 08-06-2022 upon evaluation today patient appears to be doing a little better in regard to his wounds in general at this point. Fortunately there does not appear to be any signs of active infection locally nor systemically at this time which is great news and overall I am extremely pleased with where we stand today. 08-13-2022 upon evaluation today patient appears to actually be doing significantly better compared to last week. He actually did go to the hospital I told him that he needed to when he left here and he actually did go. With that being said they actually ended up admitting him he was having shortness of breath and I thought it might be related to congestive heart failure turns out he actually had a pulmonary embolism. Subsequently they were able to get him off of the Coumadin switching over to Eliquis to get things stabilized in that regard they also had them wrapped and got his swelling under control on his legs he actually looks much better pretty much across the board at this point. I am very pleased in that regard. With that being said I am very happy that he finally went that could have been a very dangerous situation. 08-20-2022 upon evaluation today patient appears to be doing well currently in regard to his wound. Has been  tolerating the dressing  changes without complication. Fortunately there does not appear to be any signs of active infection locally nor systemically at this time. I think his legs are doing better there is some need for sharp debridement today. 08-27-2022 upon evaluation patient is actually making excellent progress. I am actually very pleased with where he stands and I think that he is moving in the right direction. In general I think that we are looking pretty good at the moment. 09-03-2022 upon evaluation today patient appears to be doing well currently in regard to his wound. He is actually tolerating dressing changes on the left and right leg without complication. Fortunately I do not see any need for debridement of the left leg the right leg I think we probably do some need to perform some debridement here. 09-10-2022 upon evaluation today patient's wounds actually showed signs of improvement in both legs I do not see much is going require debridement today which was great news. Fortunately I do not see any evidence of infection which I think is also excellent he seems to be using his pumps and doing everything right I am happy about how this is progressing at this point. 09-17-2022 upon evaluation today patient appears to be doing decently well in regard to his wounds. He has been tolerating the dressing changes without complication. Fortunately there does not appear to be any signs of active infection at this time which is good news. 09-24-2022 upon evaluation today patient appears to be doing well currently in regard to his wounds. He has been tolerating the dressing changes without complication. Fortunately there does not appear to be any signs of active infection locally nor systemically which is great news. No fevers, chills, nausea, vomiting, or diarrhea. 10-08-2022 upon evaluation today patient appears to be doing excellent currently in regard to his wound. He has been tolerating the dressing changes  without complication though it does not sound like he has been using his compression wraps for a bit here. He does think he was doing better with the [facility] topical antibiotics we can definitely go back to using that but I think the biggest issue here is that his swelling is just very out of control and needs to be under control. I discussed that with him today. 10-15-2022 upon evaluation today patient appears to be doing well currently in regard to his wounds. He is actually making some progress which is good news. Fortunately I do not see any evidence of active infection locally nor systemically which is great news as well. No fevers, chills, nausea, vomiting, or diarrhea. He does have a callused area on the plantar aspect of his left foot which is actually causing some pain and he wonders if I can trim this down for him. 10-22-2022 upon evaluation today patient seems to be making progress. He is actually doing quite well and very pleased in that regard. I do not see any signs of active infection at this time. 11-05-2022 upon evaluation patient appears to be making progress although slowly towards closure. He seems to be doing well with regard to his legs were still using the [facility] topical antibiotics and he seems to be doing quite well. He is going require some sharp debridement today. 11-19-2022 upon evaluation today patient unfortunately appears to be extremely swollen at this point. He tells me that he ran out of supplies he also tells me his leg started leaking more because of not having supplies he was unable to wear his compression  wraps that is the juxta fit compression wraps. Therefore his legs are extremely swollen much larger than normal and do not appear to be doing well at all today. He is going require some debridement I also think he is going require [place] to perform compression wrapping today. 11-26-2022 upon evaluation today patient appears to be doing well currently in regard to his  legs as far as infection is concerned I see nothing that appears to be infected. Fortunately I do not see any signs of active infection systemically either which is also good news. With that being said he still is extremely swollen as far as his legs are concerned. I do not see any signs of overall worsening but also do not see any signs of significant improvement which is the major issue here. Electronic Signature(s) Signed: 11/26/2022 3:31:08 PM By: [name] PA-C Entered By: [name] on 11/26/2022 15:31:08 [name] [name] [name] [name] ([id number]) [id number] Page 4 of 12 -------------------------------------------------------------------------------- Physical Exam Details Patient Name: Date of Service: [name] [name] [name] 11/26/2022 2:00 PM Medical Record Number: [id number] Patient Account Number: [id number] Date of Birth/Sex: Treating RN: [birth year] (77 y.o. Male) Primary Care Provider: [name] Other Clinician: Referring Provider: Treating Provider/Extender: [name] Weeks in Treatment: 35 Constitutional Obese and well-hydrated in no acute distress. Respiratory normal breathing without difficulty. Psychiatric this patient is able to make decisions and demonstrates good insight into disease process. Alert and Oriented x 3. pleasant and cooperative. Notes Upon inspection patient's wound bed actually showed signs of good granulation and some areas in other areas he was extremely wet and weeping. Subsequently I do not think any of the wounds are worse but also think that his drainage is significantly worse compared to what we were previous and the seeing out of him. Even with the 4-layer compression wrap this is still staying very well. I think this is the primary issue here. Electronic Signature(s) Signed: 11/26/2022 3:31:34 PM By: [name] PA-C Entered By: [name] on 11/26/2022  15:31:34 -------------------------------------------------------------------------------- Physician Orders Details Patient Name: Date of Service: [name] [name] [name] 11/26/2022 2:00 PM Medical Record Number: [id number] Patient Account Number: [id number] Date of Birth/Sex: Treating RN: [birth year] (76 y.o. Male) [name] Primary Care Provider: [name] Other Clinician: Referring Provider: Treating Provider/Extender: [name] in Treatment: 43 Verbal / Phone Orders: No Diagnosis Coding ICD-10 Coding Code Description [phone] Chronic venous hypertension (idiopathic) with ulcer and inflammation of bilateral lower extremity I89.0 Lymphedema, not elsewhere classified 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 other part of right lower leg with other specified severity L84 Corns and callosities Follow-up Appointments ppointment in 1 week. [name] PA-C Wednesday Return A Anesthetic (In clinic) Topical Lidocaine 5% applied to wound bed Bathing/ Shower/ Hygiene May shower and wash wound with soap and water. - apply clean dressing after bathing. Do not soak legs in tub, may rinse in shower Edema Control - Lymphedema / SCD / Other Lymphedema Pumps. Use Lymphedema pumps on leg(s) 2-3 times a day for 45-60 minutes. If wearing any wraps or hose, do not remove them. Continue exercising as instructed. - *****pump 3 times a day for an hour each time.***** Ricciardelli, [name] [name] ([id number]) [id number] Page 5 of 12 Elevate legs to the level of the heart or above for 30 minutes daily and/or when sitting for 3-4 times a day throughout the day. - +++ Important+++ Avoid  standing for long periods of time. Exercise regularly Compression stocking or Garment 30-40 mm/Hg pressure to: - juxtafit Essentials Long for bilateral lower legs; quantity three  juxtafit essentials per limb. Wound Treatment Wound #11 - Lower Leg Wound Laterality: Left, Posterior Cleanser: Soap and Water 1 x Per Week/30 Days Discharge Instructions: May shower and wash wound with dial antibacterial soap and water prior to dressing change. Peri-Wound Care: Sween Lotion (Moisturizing lotion) 1 x Per Week/30 Days Discharge Instructions: Apply moisturizing lotion as directed Topical: Keystone 1 x Per Week/30 Days Discharge Instructions: On Hold -Do not use for now Prim Dressing: Hydrofera Blue Ready Transfer Foam, 4x5 (in/in) 1 x Per Week/30 Days ary Discharge Instructions: Apply to wound bed as instructed Secondary Dressing: ABD Pad, 5x9 1 x Per Week/30 Days Discharge Instructions: Apply over primary dressing as directed. Secondary Dressing: Woven Gauze Sponge, Non-Sterile 4x4 in 1 x Per Week/30 Days Discharge Instructions: Apply over primary dressing as directed. Secondary Dressing: Zetuvit Plus 4x8 in 1 x Per Week/30 Days Discharge Instructions: Apply over primary dressing as directed. Compression Wrap: FourPress (4 layer compression wrap) 1 x Per Week/30 Days Discharge Instructions: Apply four layer compression as directed. May also use Urgo K2 compression system as alternative. Compression Wrap: Juxtafit 30-73mmHg 1 x Per Week/30 Days Discharge Instructions: On Hold for now (11/19/22) apply in the morning and remove at night. Wound #6 - Lower Leg Wound Laterality: Right, Medial Cleanser: Soap and Water 1 x Per Week/30 Days Discharge Instructions: May shower and wash wound with dial antibacterial soap and water prior to dressing change. Peri-Wound Care: Sween Lotion (Moisturizing lotion) 1 x Per Week/30 Days Discharge Instructions: Apply moisturizing lotion as directed Topical: Keystone 1 x Per Week/30 Days Discharge Instructions: On Hold -Do not use for now Prim Dressing: Hydrofera Blue Ready Transfer Foam, 4x5 (in/in) 1 x Per Week/30 Days ary Discharge  Instructions: Apply to wound bed as instructed Secondary Dressing: ABD Pad, 5x9 1 x Per Week/30 Days Discharge Instructions: Apply over primary dressing as directed. Secondary Dressing: Woven Gauze Sponge, Non-Sterile 4x4 in 1 x Per Week/30 Days Discharge Instructions: Apply over primary dressing as directed. Secondary Dressing: Zetuvit Plus 4x8 in 1 x Per Week/30 Days Discharge Instructions: Apply over primary dressing as directed. Compression Wrap: FourPress (4 layer compression wrap) 1 x Per Week/30 Days Discharge Instructions: Apply four layer compression as directed. May also use Urgo K2 compression system as alternative. Compression Wrap: Juxtafit 30-17mmHg 1 x Per Week/30 Days Discharge Instructions: On Hold for now (11/19/22) apply in the morning and remove at night. Wound #7 - Lower Leg Wound Laterality: Left, Medial Cleanser: Soap and Water 1 x Per Week/30 Days Discharge Instructions: May shower and wash wound with dial antibacterial soap and water prior to dressing change. Peri-Wound Care: Sween Lotion (Moisturizing lotion) 1 x Per Week/30 Days Discharge Instructions: Apply moisturizing lotion as directed Topical: Keystone 1 x Per Week/30 Days Discharge Instructions: On Hold -Do not use for now Prim Dressing: Hydrofera Blue Ready Transfer Foam, 4x5 (in/in) 1 x Per Week/30 Days ary Discharge Instructions: Apply to wound bed as instructed Secondary Dressing: ABD Pad, 5x9 1 x Per Week/30 Days [name] [name] ([id number]) [id number] Page 6 of 12 Discharge Instructions: Apply over primary dressing as directed. Secondary Dressing: Woven Gauze Sponge, Non-Sterile 4x4 in 1 x Per Week/30 Days Discharge Instructions: Apply over primary dressing as directed. Secondary Dressing: Zetuvit Plus 4x8 in 1 x Per Week/30 Days Discharge Instructions: Apply over primary dressing as directed. Compression Wrap:  FourPress (4 layer compression wrap) 1 x Per Week/30  Days Discharge Instructions: Apply four layer compression as directed. May also use Urgo K2 compression system as alternative. Compression Wrap: Juxtafit 30-41mmHg 1 x Per Week/30 Days Discharge Instructions: On Hold for now (11/19/22) apply in the morning and remove at night. Electronic Signature(s) Signed: 11/26/2022 5:25:01 PM By: [name] RN Signed: 11/26/2022 5:48:19 PM By: [name] PA-C Entered By: [name] on 11/26/2022 16:35:43 -------------------------------------------------------------------------------- Problem List Details Patient Name: Date of Service: [name] [name] [name] 11/26/2022 2:00 PM Medical Record Number: [id number] Patient Account Number: [id number] Date of Birth/Sex: Treating RN: 08-Jan-1946 (77 y.o. 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 of 06/11/2022 No Yes bilateral lower extremity I89.0 Lymphedema, not elsewhere classified 03/26/2022 No Yes L97.828 Non-pressure chronic ulcer of other part of left lower leg with other specified 03/26/2022 No Yes severity 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 other part of right lower leg with other specified 03/26/2022 No Yes severity L84 Corns and callosities 10/15/2022 No Yes Inactive Problems Resolved Problems Electronic Signature(s) Signed: 11/26/2022 2:50:52 PM By: [name] PA-C Entered By: [name] on 11/26/2022 14:50:52 Dykema, [name] [name] ([id number]) [id number] Page 7 of 12 -------------------------------------------------------------------------------- Progress Note Details Patient Name: Date of Service: [name] [name] [name] 11/26/2022 2:00 PM Medical Record Number: [id number] Patient Account Number:  [id number] Date of Birth/Sex: Treating RN: 04-25-1946 (77 y.o. Male) Primary Care Provider: [name] Other Clinician: Referring Provider: Treating Provider/Extender: [name] Weeks in Treatment: 26 Subjective Chief Complaint Information obtained from Patient 02/23/2020; patient is here for wounds on his bilateral lower legs in the setting of severe lymphedema 03/26/2022; patient is here for wounds on his bilateral lower legs medial aspect History of Present Illness (HPI) ADMISSION 02/23/2020 Patient is a 77 year old man who lives in [place] who arrives accompanied by his wife. He has a history of chronic lymphedema and venous insufficiency in his bilateral lower legs which may have something to do that with having a history of DVT as well as being treated for prostate cancer. In any case he recently got compression pumps at home but compliance has been an issue here. He has compression stockings however they are probably not sufficient enough to control swelling. They tell us that things deteriorated for him in late August he was admitted to [facility] for 7 days. This was with cellulitis I think of his bilateral lower legs. Discharge he was noted to have wounds on his bilateral lower legs. He was discharged on Bactrim. They tried to get him home health through [facility] of course they declined him. His wife is been wrapping these applying some form of silver foam dressing. He has a history of wounds before although nothing that would not heal with basic home topical dressings. He has 2 areas on the left medial, left anterior and left lateral and a smaller area on the right medial. All of these have considerable depth. Past medical history includes iron deficiency anemia, lymphedema followed by the rehab center at [facility] with lymphedema wraps I believe, DVT on chronic anticoagulation, prostate cancer, chronic venous insufficiency,  hypertension. As mentioned he has compression pumps but does not use them. ABIs in our clinic were noncompressible bilaterally 10/14; patient with severe bilateral lymphedema right  greater than left. He came in with bilateral lower extremity wounds left greater than right. Even though the right side has more of the edema most of the wounds here almost closed on the right medial. He has 3 remaining wounds on the left We have been using silver alginate under 4-layer compression I have been trying to get him to be compliant with his external compression pumps 10/21; patient with 3 small wounds on the left leg and 1 on the right medial in the setting of severe lymphedema and chronic venous insufficiency. We have been using silver alginate under 4-layer compression he is using his external compression pumps twice a day 11/4; ARTERIAL STUDIES on the right show an ABI of 1.02 TBI of 0.858 with biphasic waveforms on the left 0.98 with a TBI of 0.55 and biphasic waveforms. Does not look like he has significant arterial disease. We are treating him for lymphedema he has compression pumps. He has punched-out areas on the left anterior left lateral and right medial lower extremities 11/11; after we obtained his arterial studies I put him in 4 layer compression. He is using his compression pumps probably once a day although I have asked him to do twice. Primary dressing to the wound is silver collagen he has severe lymphedema likely secondary to chronic venous insufficiency. Wounds on the left lateral, left medial and left anterior and a small area on the right medial 12/2; the area on the right anterior lower leg has healed. We initially thought that the area medially had healed as well however when her discharge nurse came in she detected fluid in the wound simply opened up. This is actually worse than I remember this pain. The area on the left lateral potentially slightly smaller He is also complaining about  pain in his left hand he says that this is actually been getting some better he has been using topical creams on this. She asked that I look at this 12/9 after last weeks issues we have 2 wounds one on the right medial lower leg and 1 on the left lateral. Both of these are in the same condition. I think because of thickened skin secondary to chronic lymphedema these wounds actually have depth of almost 0.8 cm. 12/16; the patient has 2 small but deep wounds one on the right medial and one on the left lateral. The right medial is actually the worst of these. He arrives in clinic today with absolutely terrible edema in the right leg apparently his 4-layer wrap fell down to just above his ankle he did not think about this he is apparently been continuing to use his compression pump twice a day. The left leg looks a lot better. 05/09/2020 upon evaluation today patient appears to be doing decently well in regard to his wounds. Everything is measuring smaller the right leg still has a little bit deeper wound in the left seems to be almost completely healed in my opinion I am very pleased in general with how things are progressing. He has a 4- layer compression wrap we have been using endoform today we will probably have to use collagen just based on the fact that we do not have endoform it is on order. 1/6; the patient's wound on the left lateral lower leg has healed. Still has 1 on the right medial. 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 Using compression pumps at home twice a day. 1/13; left lateral lower leg is still healed. He has a deep  punched out rectangular shaped wound on the right medial calf. Looking down at this it appears that he is attempting to epithelialize around the edges of the wound and on the base as well. His edema is reasonably well controlled we have been using collagen with absolutely no effect 1/20; left lateral lower leg remains closed he has extremitease  stockings. The area on the right medial calf I aggressively debrided last week measures larger but the surface looks better. We have been using Hydrofera Blue. We ran Oasis through his insurance but we have not seen the results of this 1/27; left lower leg wound with chronic venous insufficiency and secondary lymphedema. I did aggressive debridement on this last week the wound seems to have come in healthy looking surface using Hydrofera Blue. He was denied for Oasis 2/3; small divot in the right medial lower leg. Under illumination the walls of this divot are epithelialized however the base has slough which I removed with a curette we have been using Hydrofera Blue 2/10 small divot on the right medial lower leg pinpoint illumination at the base of this cone-shaped wound. We have been using [facility] but I will switch [name] [name] ([id number]) [phone].pdf Page 8 of 12 to calcium alginate this week 2/17; the small divot on the right medial lower leg is fully epithelialized. There is no visible open area under illumination. He has his own stocking for the right leg similar to the one he has been wearing on the left. 03/26/2022; READMISSION This is a now 77 year old man that we had in the clinic from 02/23/2020 through 07/05/2020. At that point he had bilateral lower extremity wounds left greater than right in the setting of severe lymphedema. He had already obtained compression pumps ordered for him I think from the wound care clinic in [facility] so I do not really have record of what he has been using. He claims to be using them once a day but there is a problem with the sleeve on the left leg. About 2 weeks ago he was hospitalized from 03/11/2022 through 03/14/2022 with diastolic congestive heart failure. 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 He was diuresed. Developed some prerenal azotemia and he has not  been taking any diuretics currently. He has not been putting stockings on his legs since he got out of hospital and still has his legs dependent for long periods. 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 History of inguinal hernia repair in September 22, hypertension, stage IIIb chronic renal failure ABIs today were noncompressible on the right 1.12 on the left 04-02-2022 upon evaluation today patient appears to be doing well currently in regard to his legs I do feel like both areas that are draining are actually much drier than they were in the picture last week although the left is drier than the right. He is tolerating the 4-layer compression wraps at this point he did contact the pump company and they are actually working on getting him a new compression sleeve for one of his legs which have previously popped and was not functioning properly. 04-23-2022 upon evaluation today patient appears to be doing well currently in regard to his wounds on the legs. I am actually very pleased with where things stand and I do  feel like that we are headed in the right direction. Fortunately there is no sign of active infection locally or systemically at this time. 05-07-2022 upon evaluation today patient appears to be doing well currently in regard to his wounds in fact things are showing signs of improvement which is good news I do not see too much that actually appears to be open and I am very pleased in that regard. No fevers, chills, nausea, vomiting, or diarrhea. 05-21-2022 upon evaluation today patient appears to be doing somewhat poorly in regard to drainage of his lower extremities bilaterally. The right is greater than left as far as the weeping area. Nonetheless it seems to be getting worse not better. He  actually has pitting edema which is at least 2+ to the thighs and I am concerned about the fact that he is may be fluid overloaded in general and that is the reason why we cannot get this under control. I know he is not using his pumps all the time because he actually told the nurse that he was either going to pump or he was going to use his fluid pills but not do both. For that reason I do think that he needs to be really doing both in order to get the fluid out as effectively as possible obviously with the 4-layer compression wraps were doing as much as we can from a compression standpoint but it is really not enough. He tells me that he elevates his leg is much as he can in between pumping and other activity throughout the day. 05-28-2022 upon evaluation today patient appears to be doing better in regard to his wounds although the measurements may be a little bit larger this is a very difficult wound to heal it is very indistinct in a lot of areas. Nonetheless there is can be some need for sharp debridement in regard to both medial and lateral legs. Fortunately I see no signs of active infection locally nor systemically at this time. No fevers, chills, nausea, vomiting, or diarrhea. 06-04-2022 upon evaluation today patient appears to be doing poorly in general in regard to the wounds on his legs. He still continues to have a tremendous amount of fluid not just in the lower portion of his leg but to be honest his thigh where he has 2-3+ pitting edema in the thigh as well. Unfortunately I do not know that we will be able to get this healed effectively and keep it healed on the lower extremities unless he gets the overall fluid situation taking and under control. Fortunately I do not see any signs of infection locally nor systemically which is great news. He just seems to be very fluid overloaded. 06-11-2022 upon evaluation today patient presents for follow-up concerning his bilateral lower extremity  lymphedema secondary to chronic venous insufficiency. He has been tolerating the dressing changes with the compression wraps without complication. Fortunately I do not see any evidence of infection at this time which is great news. No fevers, chills, nausea, vomiting, or diarrhea. 06-18-2022 upon evaluation today patient appears to be doing well currently in regard to his wounds as far as not looking like they are terribly infected but nonetheless I am concerned about a subacute infection secondary to the fact that he continues to have spreading despite the compression therapy. We actually did do an Unna boot on him last week this is actually the first wrap that actually stayed up everything else has been sliding down quite significantly. Fortunately there does  not appear to be any signs of infection systemically at this time. With that being said I do believe that locally there seems to be an issue going on here and again I [name] Hew do a PCR culture to see what that shows also think that I am going to put him on a broad-spectrum antibiotic, doxycycline to see how that will help as well. He does tell me that coming into the clinic today that he was feeling short of breath like "he was about to have a heart attack" because he was having such a hard time breathing. He says that he told this to Dr. Jodelle Green his cardiologist as well when he was evaluated in the past 1 to 2 weeks. 07-02-2022 upon evaluation today patient appears to be doing poorly currently in regard to his wound. He has been tolerating the dressing changes. Unfortunately he has not had any compression wraps on for the past week because he was unable to make it in for his appointment last week. With that being said he has a significant amount of drainage he tells me has been using his pumps but despite this in the pumps he still has been draining quite a bit. The drainage is also somewhat purulent unfortunately. We did attempt to get in touch with  his cardiologist last week unfortunately we were unable to get up with him I did advise that the patient needs to get in touch with him upon leaving today in order to make sure they know he is on the new antibiotics I am going to send him this will be Levaquin and Augmentin. 07-09-2022 upon evaluation today patient appears to be doing about the same in regard to his legs he may have just a slight amount of improvement with regard to the drainage probably Keystone topical antibiotics are helping in this regard to some degree. Fortunately there does not appear to be any signs of active infection systemically which is great news. No fevers, chills, nausea, vomiting, or diarrhea. 07-16-2022 upon evaluation today patient appears to be doing well currently in regard to his wound. He has been tolerating the dressing changes without complication. Fortunately there does not appear to be any signs of active infection locally nor systemically at this time. With that being said he cannot keep the wraps up he tells me on the left side he had to cut this down because it got too tight. He has been using his pumps but he is on the right side the wrap actually straight down causing some pushing around the central part of his leg just below the calf I think this is a bigger risk for him that help at this point. I think that we may need to try something different he should be getting his compression socks shortly he tells me they were ordered last Thursday. 3/6; ; this is a patient who lives in [place]. He has severe bilateral lymphedema. He has compression pumps, we have been using kerlix Ace wrap Keystone. He is changing the dressing. We do not have home health. 08-06-2022 upon evaluation today patient appears to be doing a little better in regard to his wounds in general at this point. Fortunately there does not appear to be any signs of active infection locally nor systemically at this time which is great news and  overall I am extremely pleased with where we stand today. 08-13-2022 upon evaluation today patient appears to actually be doing significantly better compared to last week. He actually did go  to the hospital I told him that he needed to when he left here and he actually did go. With that being said they actually ended up admitting him he was having shortness of breath and I thought it might be related to congestive heart failure turns out he actually had a pulmonary embolism. Subsequently they were able to get him off of the Coumadin switching over to Eliquis to get things stabilized in that regard they also had them wrapped and got his swelling under control on his legs he actually looks much better pretty much across the board at this point. I am very pleased in that regard. With that being said I am very happy that he finally went that could have been a very dangerous situation. [name] [name] [name] [name] ([id number]) [id number] Page 9 of 12 08-20-2022 upon evaluation today patient appears to be doing well currently in regard to his wound. Has been tolerating the dressing changes without complication. Fortunately there does not appear to be any signs of active infection locally nor systemically at this time. I think his legs are doing better there is some need for sharp debridement today. 08-27-2022 upon evaluation patient is actually making excellent progress. I am actually very pleased with where he stands and I think that he is moving in the right direction. In general I think that we are looking pretty good at the moment. 09-03-2022 upon evaluation today patient appears to be doing well currently in regard to his wound. He is actually tolerating dressing changes on the left and right leg without complication. Fortunately I do not see any need for debridement of the left leg the right leg I think we probably do some need to perform some debridement here. 09-10-2022 upon evaluation  today patient's wounds actually showed signs of improvement in both legs I do not see much is going require debridement today which was great news. Fortunately I do not see any evidence of infection which I think is also excellent he seems to be using his pumps and doing everything right I am happy about how this is progressing at this point. 09-17-2022 upon evaluation today patient appears to be doing decently well in regard to his wounds. He has been tolerating the dressing changes without complication. Fortunately there does not appear to be any signs of active infection at this time which is good news. 09-24-2022 upon evaluation today patient appears to be doing well currently in regard to his wounds. He has been tolerating the dressing changes without complication. Fortunately there does not appear to be any signs of active infection locally nor systemically which is great news. No fevers, chills, nausea, vomiting, or diarrhea. 10-08-2022 upon evaluation today patient appears to be doing excellent currently in regard to his wound. He has been tolerating the dressing changes without complication though it does not sound like he has been using his compression wraps for a bit here. He does think he was doing better with the [facility] topical antibiotics we can definitely go back to using that but I think the biggest issue here is that his swelling is just very out of control and needs to be under control. I discussed that with him today. 10-15-2022 upon evaluation today patient appears to be doing well currently in regard to his wounds. He is actually making some progress which is good news. Fortunately I do not see any evidence of active infection locally nor systemically which is great news as well. No fevers, chills, nausea, vomiting, or diarrhea. He  does have a callused area on the plantar aspect of his left foot which is actually causing some pain and he wonders if I can trim this down for  him. 10-22-2022 upon evaluation today patient seems to be making progress. He is actually doing quite well and very pleased in that regard. I do not see any signs of active infection at this time. 11-05-2022 upon evaluation patient appears to be making progress although slowly towards closure. He seems to be doing well with regard to his legs were still using the [facility] topical antibiotics and he seems to be doing quite well. He is going require some sharp debridement today. 11-19-2022 upon evaluation today patient unfortunately appears to be extremely swollen at this point. He tells me that he ran out of supplies he also tells me his leg started leaking more because of not having supplies he was unable to wear his compression wraps that is the juxta fit compression wraps. Therefore his legs are extremely swollen much larger than normal and do not appear to be doing well at all today. He is going require some debridement I also think he is going require [place] to perform compression wrapping today. 11-26-2022 upon evaluation today patient appears to be doing well currently in regard to his legs as far as infection is concerned I see nothing that appears to be infected. Fortunately I do not see any signs of active infection systemically either which is also good news. With that being said he still is extremely swollen as far as his legs are concerned. I do not see any signs of overall worsening but also do not see any signs of significant improvement which is the major issue here. Objective Constitutional Obese and well-hydrated in no acute distress. Vitals Time Taken: 2:37 PM, Height: 74 in, Weight: 250 lbs, BMI: 32.1, Temperature: 98.1 F, Pulse: 99 bpm, Respiratory Rate: 20 breaths/min, Blood Pressure: 181/60 mmHg. Respiratory normal breathing without difficulty. Psychiatric this patient is able to make decisions and demonstrates good insight into disease process. Alert and Oriented x 3. pleasant and  cooperative. General Notes: Upon inspection patient's wound bed actually showed signs of good granulation and some areas in other areas he was extremely wet and weeping. Subsequently I do not think any of the wounds are worse but also think that his drainage is significantly worse compared to what we were previous and the seeing out of him. Even with the 4-layer compression wrap this is still staying very well. I think this is the primary issue here. Integumentary (Hair, Skin) Wound #11 status is Open. Original cause of wound was Gradually Appeared. The date acquired was: 10/19/2022. The wound has been in treatment 5 weeks. The wound is located on the Left,Posterior Lower Leg. The wound measures 2.5cm length x 2.5cm width x 0.1cm depth; 4.909cm^2 area and 0.491cm^3 volume. There is Fat Layer (Subcutaneous Tissue) exposed. There is no tunneling or undermining noted. There is a medium amount of serosanguineous drainage noted. There is small (1-33%) pink granulation within the wound bed. There is a large (67-100%) amount of necrotic tissue within the wound bed including Adherent Slough. The periwound skin appearance had no abnormalities noted for texture. The periwound skin appearance had no abnormalities noted for color. The periwound skin appearance exhibited: Dry/Scaly. The periwound skin appearance did not exhibit: Maceration. Periwound temperature was noted as No Abnormality. Wound #6 status is Open. Original cause of wound was Gradually Appeared. The date acquired was: 03/05/2022. The wound has been in treatment  35 weeks. The wound is located on the Right,Medial Lower Leg. The wound measures 8.5cm length x 5.5cm width x 0.2cm depth; 36.717cm^2 area and 7.343cm^3 volume. There is Fat Layer (Subcutaneous Tissue) exposed. There is no tunneling or undermining noted. There is a medium amount of serosanguineous drainage noted. The wound margin is distinct with the outline attached to the wound base. There  is small (1-33%) pink granulation within the wound bed. There is a large (67- 100%) amount of necrotic tissue within the wound bed including Adherent Slough. The periwound skin appearance exhibited: Scarring, Dry/Scaly, Hemosiderin Staining. The periwound skin appearance did not exhibit: Callus, Crepitus, Excoriation, Induration, Rash, Maceration, Atrophie Blanche, Cyanosis, Ecchymosis, Paparella, [place] ([id number]) [phone].pdf Page 10 of 12 Mottled, Pallor, Rubor, Erythema. Wound #7 status is Open. Original cause of wound was Gradually Appeared. The date acquired was: 03/05/2022. The wound has been in treatment 35 weeks. The wound is located on the Left,Medial Lower Leg. The wound measures 3cm length x 1.4cm width x 0.2cm depth; 3.299cm^2 area and 0.66cm^3 volume. There is Fat Layer (Subcutaneous Tissue) exposed. There is no tunneling or undermining noted. There is a medium amount of serosanguineous drainage noted. The wound margin is distinct with the outline attached to the wound base. There is small (1-33%) pink granulation within the wound bed. There is a large (67- 100%) amount of necrotic tissue within the wound bed including Adherent Slough. The periwound skin appearance exhibited: Scarring, Maceration, Hemosiderin Staining. The periwound skin appearance did not exhibit: Callus, Crepitus, Excoriation, Induration, Rash, Dry/Scaly, Atrophie Blanche, Cyanosis, Ecchymosis, Mottled, Pallor, Rubor, Erythema. 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 [name] PA-C  Wednesday Anesthetic: (In clinic) Topical Lidocaine 5% applied to wound bed Bathing/ Shower/ Hygiene: May shower and wash wound with soap and water. - apply clean dressing after bathing. Do not soak legs in tub, may rinse in shower Edema Control - Lymphedema / SCD / Other: Lymphedema Pumps. Use Lymphedema pumps on leg(s) 2-3 times a day for 45-60 minutes. If wearing any wraps or hose, do not remove them. Continue exercising as instructed. - *****pump 3 times a day for an hour each time.***** Elevate legs to the level of the heart or above for 30 minutes daily and/or when sitting for 3-4 times a day throughout the day. - +++ Important+++ Avoid standing for long periods of time. Exercise regularly Compression stocking or Garment 30-40 mm/Hg pressure to: - juxtafit Essentials Long for bilateral lower legs; quantity three juxtafit essentials per limb. WOUND #11: - Lower Leg Wound Laterality: Left, Posterior Cleanser: Soap and Water 1 x Per Day/30 Days Discharge Instructions: May shower and wash wound with dial antibacterial soap and water prior to dressing change. Peri-Wound Care: Sween Lotion (Moisturizing lotion) 1 x Per Day/30 Days Discharge Instructions: Apply moisturizing lotion as directed Topical: Keystone 1 x Per Day/30 Days Discharge Instructions: On Hold -Do not use for now Prim Dressing: Hydrofera Blue Ready Transfer Foam, 4x5 (in/in) 1 x Per Day/30 Days ary Discharge Instructions: Apply to wound bed as instructed Secondary Dressing: ABD Pad, 5x9 1 x Per Day/30 Days Discharge Instructions: Apply over primary dressing as directed. Secondary Dressing: Woven Gauze Sponge, Non-Sterile 4x4 in 1  x Per Day/30 Days Discharge Instructions: Apply over primary dressing as directed. Com pression Wrap: FourPress (4 layer compression wrap) 1 x Per Day/30 Days Discharge Instructions: Apply four layer compression as directed. May also use Urgo K2 compression system as alternative. Com pression  Wrap: Juxtafit 30-49mmHg 1 x Per Day/30 Days Discharge Instructions: On Hold for now (11/19/22) apply in the morning and remove at night. WOUND #6: - Lower Leg Wound Laterality: Right, Medial Cleanser: Soap and Water 1 x Per Day/30 Days Discharge Instructions: May shower and wash wound with dial antibacterial soap and water prior to dressing change. Peri-Wound Care: Sween Lotion (Moisturizing lotion) 1 x Per Day/30 Days Discharge Instructions: Apply moisturizing lotion as directed Topical: Keystone 1 x Per Day/30 Days Discharge Instructions: On Hold -Do not use for now Prim Dressing: Hydrofera Blue Ready Transfer Foam, 4x5 (in/in) 1 x Per Day/30 Days ary Discharge Instructions: Apply to wound bed as instructed Secondary Dressing: ABD Pad, 5x9 1 x Per Day/30 Days Discharge Instructions: Apply over primary dressing as directed. Secondary Dressing: Woven Gauze Sponge, Non-Sterile 4x4 in 1 x Per Day/30 Days Discharge Instructions: Apply over primary dressing as directed. Com pression Wrap: FourPress (4 layer compression wrap) 1 x Per Day/30 Days Discharge Instructions: Apply four layer compression as directed. May also use Urgo K2 compression system as alternative. Com pression Wrap: Juxtafit 30-55mmHg 1 x Per Day/30 Days Discharge Instructions: On Hold for now (11/19/22) apply in the morning and remove at night. WOUND #7: - Lower Leg Wound Laterality: Left, Medial Cleanser: Soap and Water 1 x Per Day/30 Days Discharge Instructions: May shower and wash wound with dial antibacterial soap and water prior to dressing change. Peri-Wound Care: Sween Lotion (Moisturizing lotion) 1 x Per Day/30 Days Discharge Instructions: Apply moisturizing lotion as directed Topical: Keystone 1 x Per Day/30 Days Discharge Instructions: On Hold -Do not use for now Prim Dressing: Hydrofera Blue Ready Transfer Foam, 4x5 (in/in) 1 x Per Day/30 Days ary Discharge Instructions: Apply to wound bed as instructed Secondary  Dressing: ABD Pad, 5x9 1 x Per Day/30 Days Discharge Instructions: Apply over primary dressing as directed. Secondary Dressing: Woven Gauze Sponge, Non-Sterile 4x4 in 1 x Per Day/30 Days [name] [name] [name] [name] ([id number]) [id number] Page 11 of 12 Discharge Instructions: Apply over primary dressing as directed. Compression Wrap: FourPress (4 layer compression wrap) 1 x Per Day/30 Days Discharge Instructions: Apply four layer compression as directed. May also use Urgo K2 compression system as alternative. Compression Wrap: Juxtafit 30-60mmHg 1 x Per Day/30 Days Discharge Instructions: On Hold for now (11/19/22) apply in the morning and remove at night. 1. Based on what I am seeing I do think that the patient should continue to have a 4-layer compression wrap. I am also can recommend we switch to [facility] to see how this will do fine over the next week. 2. I am also can recommend the patient should continue to monitor for any evidence of infection or worsening. Overall based on what I am seeing I think that we are headed in the appropriate direction is just a matter of time as far as trying to get his swelling under control. Nonetheless I did discuss with the patient that he does need to try as much as possible to keep his swelling under good control this means the need to elevate, use his lymphedema pumps twice a day, continue with his fluid pills, and wear covering the compression with a 4-layer compression wraps. We will see patient back for reevaluation  in 1 week here in the clinic. If anything worsens or changes patient will contact our office for additional recommendations. Electronic Signature(s) Signed: 11/26/2022 3:32:27 PM By: [name] PA-C Entered By: [name] on 11/26/2022 15:32:27 -------------------------------------------------------------------------------- SuperBill Details Patient Name: Date of Service: [place], [name] [name] 11/26/2022 Medical Record  Number: [id number] Patient Account Number: [id number] Date of Birth/Sex: Treating RN: [birth year] (77 y.o. Male) Primary Care Provider: [name] Other Clinician: Referring Provider: Treating Provider/Extender: [name] Weeks in Treatment: 35 Diagnosis Coding ICD-10 Codes Code Description [phone] Chronic venous hypertension (idiopathic) with ulcer and inflammation of bilateral lower extremity I89.0 Lymphedema, not elsewhere classified 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 other part of right lower leg with other specified severity L84 Corns and callosities Facility Procedures : CPT4: Code [id number] foot Description: 1 BILATERAL: Application of multi-layer venous compression system; leg (below knee), including ankle and . ICD-10 Diagnosis Description 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.818  Non-pressure chronic ulcer of other part of right lower leg with other specified severity I87.333 Chronic venous hypertension (idiopathic) with ulcer and inflammation of bilateral lower extre I89.0 Lymphedema, not elsewhere classified Modifier: mity Quantity: 1 Physician Procedures : CPT4 Code Description Modifier [id number] - WC PHYS LEVEL 3 - EST PT ICD-10 Diagnosis Description I87.333 Chronic venous hypertension (idiopathic) with ulcer and inflammation of bilateral lower extremity I89.0 Lymphedema, not elsewhere classified  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 other part of right lower leg with other specified severity Quantity: 1 Electronic Signature(s) [name] [name] [name] [name] ([id number]) [id number] Page 12 of 12 Signed: 12/11/2022 10:08:12 AM By: Pearletha Alfred Signed: 12/15/2022 4:49:37 PM  By: [name] PA-C Previous Signature: 11/26/2022 3:32:45 PM Version By: [name] PA-C Entered By: Pearletha Alfred on 12/11/2022 10:08:12

## 2022-11-28 NOTE — Progress Notes (Addendum)
[name], [name] ([id number]) [id number] Page 1 of 7 Visit Report for 11/26/2022 Arrival Information Details Patient Name: Date of Service: [name], [name] 11/26/2022 2:00 PM Medical Record Number: [id number] Patient Account Number: [id number] Date of Birth/Sex: Treating RN: [birth year] (77 y.o. M) Primary Care [name]: [name] Other Clinician: Referring [name]: Treating [name]/Extender: [name] in Treatment: 35 Visit Information History Since Last Visit Added or deleted any medications: No Patient Arrived: Cane Any new allergies or adverse reactions: No Arrival Time: 14:37 Had a fall or experienced change in No Accompanied By: self activities of daily living that may affect Transfer Assistance: None risk of falls: Patient Identification Verified: Yes Signs or symptoms of abuse/neglect since last visito No Secondary Verification Process Completed: Yes Hospitalized since last visit: No Patient Requires Transmission-Based Precautions: No Implantable device outside of the clinic excluding No Patient Has Alerts: Yes cellular tissue based products placed in the center Patient Alerts: Patient on Blood Thinner since last visit: Right ABI in clinic [place] Has Dressing in Place as Prescribed: Yes Pain Present Now: No Electronic Signature(s) Signed: 11/28/2022 1:10:34 PM By: [name] Entered By: [name] on 11/26/2022 14:39:00 -------------------------------------------------------------------------------- Compression Therapy Details Patient Name: Date of Service: [name] 11/26/2022 2:00 PM Medical Record Number: [id number] Patient Account Number: [id number] Date of Birth/Sex: Treating RN: August 04, 1945 (77 y.o. M) Primary Care [name]: [name] Other Clinician: Referring [name]: Treating [name]/Extender: [name] Weeks in Treatment: 35 Compression Therapy Performed  for Wound Assessment: Wound #11 Left,Posterior Lower Leg Performed By: Clinician [name] Schwalbe, RN Compression Type: Four Layer Post Procedure Diagnosis Same as Pre-procedure Electronic Signature(s) Signed: 12/11/2022 10:07:33 AM By: [name] Entered By: [name] on 12/11/2022 10:07:33 [name], [name] ([id number]) [id number] Page 2 of 7 -------------------------------------------------------------------------------- Compression Therapy Details Patient Name: Date of Service: [name] 11/26/2022 2:00 PM Medical Record Number: [id number] Patient Account Number: [id number] Date of Birth/Sex: Treating RN: [birth year] (77 y.o. M) Primary Care [name]: [name] Other Clinician: Referring [name]: Treating [name]/Extender: [name] Weeks in Treatment: 35 Compression Therapy Performed for Wound Assessment: Wound #6 Right,Medial Lower Leg Performed By: Clinician [name] Schwalbe, RN Compression Type: Four Layer Post Procedure Diagnosis Same as Pre-procedure Electronic Signature(s) Signed: 12/11/2022 10:07:33 AM By: [name] Entered By: [name] on 12/11/2022 10:07:33 -------------------------------------------------------------------------------- Compression Therapy Details Patient Name: Date of Service: [name] 11/26/2022 2:00 PM Medical Record Number: [id number] Patient Account Number: [id number] Date of Birth/Sex: Treating RN: [birth year] (77 y.o. M) Primary Care [name]: [name] Other Clinician: Referring Montgomery [name]: Treating [name]/Extender: [name] Weeks in Treatment: 35 Compression Therapy Performed for Wound Assessment: Wound #7 Left,Medial Lower Leg Performed By: Clinician [name] Schwalbe, RN Compression Type: Four Layer Post Procedure Diagnosis Same as Pre-procedure Electronic Signature(s) Signed: 12/11/2022 10:07:33 AM By: [name] Entered By:  [name] on 12/11/2022 10:07:33 -------------------------------------------------------------------------------- Lower Extremity Assessment Details Patient Name: Date of Service: [name] 11/26/2022 2:00 PM Medical Record Number: [id number] Patient Account Number: [id number] Date of Birth/Sex: Treating RN: 06/08/1945 (77 y.o. M) Primary Care [name]: [name] Other Clinician: Referring [name]: Treating [name]/Extender: [name], [name] Weeks in Treatment: 35 Edema Assessment Assessed: [Left: No] [Right: No] Edema: [Left: Yes] [Right: Yes] Calf Left: Right: Point of Measurement: 40 cm From Medial Instep 56 cm 65 cm [name], [name] ([id number]) [id number] Page 3 of 7 Ankle Left: Right: Point of Measurement:  13 cm From Medial Instep 43.6 cm 42.9 cm Vascular Assessment Pulses: Dorsalis Pedis Palpable: [Left:Yes] [Right:Yes] Extremity colors, hair growth, and conditions: Extremity Color: [Left:Normal] [Right:Normal] Hair Growth on Extremity: [Left:No] Temperature of Extremity: [Left:Warm > 3 seconds] [Right:Warm > 3 seconds] Toe Nail Assessment Left: Right: Thick: Yes Yes Discolored: Yes Yes Deformed: Yes Yes Improper Length and Hygiene: Yes Yes Electronic Signature(s) Signed: 11/26/2022 5:25:01 PM By: [name] Schwalbe RN Entered By: [name] on 11/26/2022 15:13:53 -------------------------------------------------------------------------------- Pain Assessment Details Patient Name: Date of Service: [name] 11/26/2022 2:00 PM Medical Record Number: [id number] Patient Account Number: [id number] Date of Birth/Sex: Treating RN: 1946-03-13 (77 y.o. M) Primary Care [name]: [name] Other Clinician: Referring [name]: Treating [name]/Extender: [name] Weeks in Treatment: 31 Active Problems Location of Pain Severity and Description of Pain Patient Has Paino No Site  Locations Pain Management and Medication Current Pain Management: Electronic Signature(s) Signed: 11/28/2022 1:10:34 PM By: [name] [name], [name] ([id number]) [id number] Page 4 of 7 Entered By: [name] on 11/26/2022 14:39:08 -------------------------------------------------------------------------------- Wound Assessment Details Patient Name: Date of Service: [name], [name] 11/26/2022 2:00 PM Medical Record Number: [id number] Patient Account Number: [id number] Date of Birth/Sex: Treating RN: Jun 05, 1945 (77 y.o. M) Primary Care Braxdon Gappa: Ricki [name] Other Clinician: Referring Ryian Lynde: Treating Saffron Busey/Extender: [name], [name] Weeks in Treatment: 35 Wound Status Wound Number: 11 Primary Lymphedema Etiology: Wound Location: Left, Posterior Lower Leg Wound Open Wounding Event: Gradually Appeared Status: Date Acquired: 10/19/2022 Comorbid Anemia, Lymphedema, Deep Vein Thrombosis, Hypertension, Weeks Of Treatment: 5 History: Received Radiation Clustered Wound: No Photos Wound Measurements Length: (cm) 2.5 Width: (cm) 2.5 Depth: (cm) 0.1 Area: (cm) 4.909 Volume: (cm) 0.491 % Reduction in Area: 60.3% % Reduction in Volume: 60.3% Epithelialization: Large (67-100%) Tunneling: No Undermining: No Wound Description Classification: Full Thickness Without Exposed Suppor Exudate Amount: Medium Exudate Type: Serosanguineous Exudate Color: red, brown t Structures Wound Bed Granulation Amount: Small (1-33%) Exposed Structure Granulation Quality: Pink Fascia Exposed: No Necrotic Amount: Large (67-100%) Fat Layer (Subcutaneous Tissue) Exposed: Yes Necrotic Quality: Adherent Slough Tendon Exposed: No Muscle Exposed: No Joint Exposed: No Bone Exposed: No Periwound Skin Texture Texture Color No Abnormalities Noted: Yes No Abnormalities Noted: Yes Moisture Temperature / Pain No Abnormalities Noted: No Temperature:  No Abnormality Dry / Scaly: Yes Maceration: No Electronic Signature(s) Signed: 11/26/2022 5:25:01 PM By: [name] Schwalbe RN Mcelhaney, [name] ([id number]) PM By: [name] Schwalbe RN [phone].pdf Page 5 of 7 Signed: 11/26/2022 5:25:01 Entered By: [name] on 11/26/2022 15:14:10 -------------------------------------------------------------------------------- Wound Assessment Details Patient Name: Date of Service: Corrigan, [name] 11/26/2022 2:00 PM Medical Record Number: [id number] Patient Account Number: [id number] Date of Birth/Sex: Treating RN: 1946/03/06 (77 y.o. M) Primary Care Krisi Azua: [name] Other Clinician: Referring Zaila Crew: Treating Lott Seelbach/Extender: [name], [name] Weeks in Treatment: 35 Wound Status Wound Number: 6 Primary Lymphedema Etiology: Wound Location: Right, Medial Lower Leg Wound Open Wounding Event: Gradually Appeared Status: Date Acquired: 03/05/2022 Comorbid Anemia, Lymphedema, Deep Vein Thrombosis, Hypertension, Weeks Of Treatment: 35 History: Received Radiation Clustered Wound: Yes Photos Wound Measurements Length: (cm) Width: (cm) Depth: (cm) Clustered Quantity: Area: (cm) Volume: (cm) 8.5 % Reduction in Area: 74.3% 5.5 % Reduction in Volume: 48.6% 0.2 Epithelialization: Medium (34-66%) 1 Tunneling: No 36.717 Undermining: No 7.343 Wound Description Classification: Full Thickness Without Exposed Sup Wound Margin: Distinct, outline attached Exudate Amount: Medium Exudate Type: Serosanguineous Exudate Color: red, brown port Structures Foul Odor After Cleansing: No Slough/Fibrino  Yes Wound Bed Granulation Amount: Small (1-33%) Exposed Structure Granulation Quality: Pink Fascia Exposed: No Necrotic Amount: Large (67-100%) Fat Layer (Subcutaneous Tissue) Exposed: Yes Necrotic Quality: Adherent Slough Tendon Exposed: No Muscle Exposed: No Joint Exposed: No Bone Exposed: No Periwound  Skin Texture Texture Color No Abnormalities Noted: No No Abnormalities Noted: No Callus: No Atrophie Blanche: No Crepitus: No Cyanosis: No Excoriation: No Ecchymosis: No Induration: No Erythema: No Rash: No Hemosiderin Staining: Yes Scarring: Yes Mottled: No [name], [name] ([id number]) [id number] Page 6 of 7 Pallor: No Moisture Rubor: No No Abnormalities Noted: No Dry / Scaly: Yes Maceration: No Electronic Signature(s) Signed: 11/26/2022 5:25:01 PM By: [name] Schwalbe RN Entered By: [name] on 11/26/2022 15:14:23 -------------------------------------------------------------------------------- Wound Assessment Details Patient Name: Date of Service: Brining, DA [name] 11/26/2022 2:00 PM Medical Record Number: [id number] Patient Account Number: [id number] Date of Birth/Sex: Treating RN: [birth year] (77 y.o. M) Primary Care Lolly Glaus: [name] Other Clinician: Referring Talha Iser: Treating Trevonn Hallum/Extender: [name], [name] Weeks in Treatment: 35 Wound Status Wound Number: 7 Primary Lymphedema Etiology: Wound Location: Left, Medial Lower Leg Wound Open Wounding Event: Gradually Appeared Status: Date Acquired: 03/05/2022 Comorbid Anemia, Lymphedema, Deep Vein Thrombosis, Hypertension, Weeks Of Treatment: 35 History: Received Radiation Clustered Wound: Yes Photos Wound Measurements Length: (cm) Width: (cm) Depth: (cm) Clustered Quantity: Area: (cm) Volume: (cm) 3 % Reduction in Area: 93% 1.4 % Reduction in Volume: 86% 0.2 Epithelialization: Medium (34-66%) 1 Tunneling: No 3.299 Undermining: No 0.66 Wound Description Classification: Full Thickness Without Exposed Sup Wound Margin: Distinct, outline attached Exudate Amount: Medium Exudate Type: Serosanguineous Exudate Color: red, brown port Structures Foul Odor After Cleansing: No Slough/Fibrino Yes Wound Bed Granulation Amount: Small (1-33%) Exposed  Structure Granulation Quality: Pink Fascia Exposed: No Necrotic Amount: Large (67-100%) Fat Layer (Subcutaneous Tissue) Exposed: Yes Necrotic Quality: Adherent Slough Tendon Exposed: No Muscle Exposed: No Joint Exposed: No Bone Exposed: No Periwound Skin Texture [name], [name] ([id number]) [id number].pdf Page 7 of 7 Texture Color No Abnormalities Noted: No No Abnormalities Noted: No Callus: No Atrophie Blanche: No Crepitus: No Cyanosis: No Excoriation: No Ecchymosis: No Induration: No Erythema: No Rash: No Hemosiderin Staining: Yes Scarring: Yes Mottled: No Pallor: No Moisture Rubor: No No Abnormalities Noted: No Dry / Scaly: No Maceration: Yes Electronic Signature(s) Signed: 11/26/2022 5:25:01 PM By: [name] Schwalbe RN Entered By: [name] on 11/26/2022 15:14:41 -------------------------------------------------------------------------------- Vitals Details Patient Name: Date of Service: Mudry, DA [name] 11/26/2022 2:00 PM Medical Record Number: [id number] Patient Account Number: [id number] Date of Birth/Sex: Treating RN: 1945-08-25 (77 y.o. M) Primary Care Andrew Soria: [name] Other Clinician: Referring Meris Reede: Treating Jaxson Keener/Extender: [name] Weeks in Treatment: 35 Vital Signs Time Taken: 14:37 Temperature (F): 98.1 Height (in): 74 Pulse (bpm): 99 Weight (lbs): 250 Respiratory Rate (breaths/min): 20 Body Mass Index (BMI): 32.1 Blood Pressure (mmHg): 181/60 Reference Range: 80 - 120 mg / dl Electronic Signature(s) Signed: 11/28/2022 1:10:34 PM By: [name] Entered By: [name] on 11/26/2022 14:37:34

## 2022-11-30 ENCOUNTER — Encounter (HOSPITAL_BASED_OUTPATIENT_CLINIC_OR_DEPARTMENT_OTHER): Payer: Medicare Other | Admitting: Internal Medicine

## 2022-12-03 ENCOUNTER — Encounter (HOSPITAL_BASED_OUTPATIENT_CLINIC_OR_DEPARTMENT_OTHER): Payer: Medicare Other | Admitting: Physician Assistant

## 2022-12-03 DIAGNOSIS — I87333 Chronic venous hypertension (idiopathic) with ulcer and inflammation of bilateral lower extremity: Secondary | ICD-10-CM | POA: Diagnosis not present

## 2022-12-03 NOTE — Progress Notes (Addendum)
January, [name] ([id number]) [id number] Page 1 of 15 Visit Report for 12/03/2022 Chief Complaint Document Details Patient Name: Date of Service: [name], [name] 12/03/2022 3:00 PM Medical Record Number: [id number] Patient Account Number: [id number] Date of Birth/Sex: Treating RN: February 26, 1946 (77 y.o. Male) Primary Care Provider: [name] Other Clinician: Referring Provider: Treating Provider/Extender: [name] Weeks in Treatment: 31 Information Obtained from: Patient Chief Complaint 02/23/2020; patient is here for wounds on his bilateral lower legs in the setting of severe lymphedema 03/26/2022; patient is here for wounds on his bilateral lower legs medial aspect Electronic Signature(s) Signed: 12/03/2022 3:23:40 PM By: [name] PA-C Entered By: [name] on 12/03/2022 15:23:40 -------------------------------------------------------------------------------- Debridement Details Patient Name: Date of Service: [name], [name] 12/03/2022 3:00 PM Medical Record Number: [id number] Patient Account Number: [id number] Date of Birth/Sex: Treating RN: 1946/05/01 (76 y.o. Male) [name] Primary Care Provider: [name] Other Clinician: Referring Provider: Treating Provider/Extender: [name] Weeks in Treatment: 36 Debridement Performed for Assessment: Wound #6 Right,Medial Lower Leg Performed By: Physician [name], PA Debridement Type: Debridement Level of Consciousness (Pre-procedure): Awake and Alert Pre-procedure Verification/Time Out Yes - 15:47 Taken: Start Time: 15:47 Pain Control: Lidocaine 4% T opical Solution Percent of Wound Bed Debrided: 10% T Area Debrided (cm): otal 33.91 Tissue and other material debrided: Viable, Non-Viable, Skin: Dermis , Skin: Epidermis Level: Skin/Epidermis Debridement Description: Selective/Open Wound Instrument: Curette Bleeding:  Minimum Hemostasis Achieved: Pressure End Time: 15:49 Procedural Pain: 0 Post Procedural Pain: 0 Response to Treatment: Procedure was tolerated well Level of Consciousness (Post- Awake and Alert procedure): Post Debridement Measurements of Total Wound Length: (cm) 16 Width: (cm) 27 Depth: (cm) 0.2 Volume: (cm) 67.858 Character of Wound/Ulcer Post Debridement: Improved [name], [name] ([id number]) [id number] Page 2 of 15 Post Procedure Diagnosis Same as Pre-procedure Notes Scribed for [name] PA, by [name]) Signed: 12/03/2022 5:37:42 PM By: [name] RN Signed: 12/15/2022 4:49:37 PM By: [name] PA-C Previous Signature: 12/03/2022 4:26:17 PM Version By: [name] PA-C Entered By: [name] on 12/03/2022 17:36:02 -------------------------------------------------------------------------------- Debridement Details Patient Name: Date of Service: [name], [name] 12/03/2022 3:00 PM Medical Record Number: [id number] Patient Account Number: [id number] Date of Birth/Sex: Treating RN: 07/09/1945 (76 y.o. Male) [name] Primary Care Provider: [name] Other Clinician: Referring Provider: Treating Provider/Extender: [name] in Treatment: 36 Debridement Performed for Assessment: Wound #11 Left,Posterior Lower Leg Performed By: Physician [name], PA Debridement Type: Debridement Level of Consciousness (Pre-procedure): Awake and Alert Pre-procedure Verification/Time Out Yes - 15:47 Taken: Start Time: 15:47 Pain Control: Lidocaine 4% T opical Solution Percent of Wound Bed Debrided: 100% T Area Debrided (cm): otal 70.65 Tissue and other material debrided: Viable, Non-Viable, Skin: Dermis , Skin: Epidermis Level: Skin/Epidermis Debridement Description: Selective/Open Wound Instrument: Curette Bleeding: Minimum Hemostasis Achieved: Pressure End Time:  15:49 Procedural Pain: 0 Post Procedural Pain: 0 Response to Treatment: Procedure was tolerated well Level of Consciousness (Post- Awake and Alert procedure): Post Debridement Measurements of Total Wound Length: (cm) 15 Width: (cm) 6 Depth: (cm) 0.1 Volume: (cm) 7.069 Character of Wound/Ulcer Post Debridement: Improved Post Procedure Diagnosis Same as Pre-procedure Notes Scribed for [name] PA, by [name]) Signed: 12/03/2022 5:37:42 PM By: [name] RN Signed: 12/15/2022 4:49:37 PM By: [name] PA-C Previous Signature: 12/03/2022 4:26:17 PM Version By: [name] PA-C Entered By: [name] on 12/03/2022 17:36:15 [name], [name] ([id number]) [id number] Page  3 of 15 -------------------------------------------------------------------------------- Debridement Details Patient Name: Date of Service: [name], [name] 12/03/2022 3:00 PM Medical Record Number: [id number] Patient Account Number: [id number] Date of Birth/Sex: Treating RN: Jun 04, 1945 (76 y.o. Male) [name] Primary Care Provider: [name] Other Clinician: Referring Provider: Treating Provider/Extender: [name] Weeks in Treatment: 36 Debridement Performed for Assessment: Wound #7 Left,Medial Lower Leg Performed By: Physician [name], PA Debridement Type: Debridement Level of Consciousness (Pre-procedure): Awake and Alert Pre-procedure Verification/Time Out Yes - 15:47 Taken: Start Time: 15:47 Pain Control: Lidocaine 4% T opical Solution Percent of Wound Bed Debrided: 100% T Area Debrided (cm): otal 4.71 Tissue and other material debrided: Viable, Non-Viable, Skin: Dermis , Skin: Epidermis Level: Skin/Epidermis Debridement Description: Selective/Open Wound Instrument: Curette Bleeding: Minimum Hemostasis Achieved: Pressure End Time: 15:49 Procedural Pain: 0 Post Procedural Pain: 0 Response to  Treatment: Procedure was tolerated well Level of Consciousness (Post- Awake and Alert procedure): Post Debridement Measurements of Total Wound Length: (cm) 3 Width: (cm) 2 Depth: (cm) 0.2 Volume: (cm) 0.942 Character of Wound/Ulcer Post Debridement: Improved Post Procedure Diagnosis Same as Pre-procedure Notes Scribed for [name] PA, by [name]) Signed: 12/03/2022 5:37:42 PM By: [name] RN Signed: 12/15/2022 4:49:37 PM By: [name] PA-C Previous Signature: 12/03/2022 4:26:17 PM Version By: [name] PA-C Entered By: [name] on 12/03/2022 17:36:31 -------------------------------------------------------------------------------- HPI Details Patient Name: Date of Service: Kabel, [name] 12/03/2022 3:00 PM Medical Record Number: [id number] Patient Account Number: [id number] Date of Birth/Sex: Treating RN: [birth year] (77 y.o. Male) Primary Care Provider: [name] Other Clinician: Referring Provider: Treating Provider/Extender: [name] Weeks in Treatment: 36 History of Present Illness [name], [name] ([id number]) [id number] Page 4 of 15 HPI Description: ADMISSION 02/23/2020 Patient is a 77 year old man who lives in [place] who arrives accompanied by his wife. He has a history of chronic lymphedema and venous insufficiency in his bilateral lower legs which may have something to do that with having a history of DVT as well as being treated for prostate cancer. In any case he recently got compression pumps at home but compliance has been an issue here. He has compression stockings however they are probably not sufficient enough to control swelling. They tell us that things deteriorated for him in late August he was admitted to [facility] for 7 days. This was with cellulitis I think of his bilateral lower legs. Discharge he was noted to have wounds on his bilateral lower legs. He was  discharged on Bactrim. They tried to get him home health through [facility] of course they declined him. His wife is been wrapping these applying some form of silver foam dressing. He has a history of wounds before although nothing that would not heal with basic home topical dressings. He has 2 areas on the left medial, left anterior and left lateral and a smaller area on the right medial. All of these have considerable depth. Past medical history includes iron deficiency anemia, lymphedema followed by the rehab center at [facility] with lymphedema wraps I believe, DVT on chronic anticoagulation, prostate cancer, chronic venous insufficiency, hypertension. As mentioned he has compression pumps but does not use them. ABIs in our clinic were noncompressible bilaterally 10/14; patient with severe bilateral lymphedema right greater than left. He came in with bilateral lower extremity wounds left greater than right. Even though the right side has more of the edema most of the wounds here  almost closed on the right medial. He has 3 remaining wounds on the left We have been using silver alginate under 4-layer compression I have been trying to get him to be compliant with his external compression pumps 10/21; patient with 3 small wounds on the left leg and 1 on the right medial in the setting of severe lymphedema and chronic venous insufficiency. We have been using silver alginate under 4-layer compression he is using his external compression pumps twice a day 11/4; ARTERIAL STUDIES on the right show an ABI of 1.02 TBI of 0.858 with biphasic waveforms on the left 0.98 with a TBI of 0.55 and biphasic waveforms. Does not look like he has significant arterial disease. We are treating him for lymphedema he has compression pumps. He has punched-out areas on the left anterior left lateral and right medial lower extremities 11/11; after we obtained his arterial studies I put him in 4  layer compression. He is using his compression pumps probably once a day although I have asked him to do twice. Primary dressing to the wound is silver collagen he has severe lymphedema likely secondary to chronic venous insufficiency. Wounds on the left lateral, left medial and left anterior and a small area on the right medial 12/2; the area on the right anterior lower leg has healed. We initially thought that the area medially had healed as well however when her discharge nurse came in she detected fluid in the wound simply opened up. This is actually worse than I remember this pain. The area on the left lateral potentially slightly smaller He is also complaining about pain in his left hand he says that this is actually been getting some better he has been using topical creams on this. She asked that I look at this 12/9 after last weeks issues we have 2 wounds one on the right medial lower leg and 1 on the left lateral. Both of these are in the same condition. I think because of thickened skin secondary to chronic lymphedema these wounds actually have depth of almost 0.8 cm. 12/16; the patient has 2 small but deep wounds one on the right medial and one on the left lateral. The right medial is actually the worst of these. He arrives in clinic today with absolutely terrible edema in the right leg apparently his 4-layer wrap fell down to just above his ankle he did not think about this he is apparently been continuing to use his compression pump twice a day. The left leg looks a lot better. 05/09/2020 upon evaluation today patient appears to be doing decently well in regard to his wounds. Everything is measuring smaller the right leg still has a little bit deeper wound in the left seems to be almost completely healed in my opinion I am very pleased in general with how things are progressing. He has a 4- layer compression wrap we have been using endoform today we will probably have to use collagen just  based on the fact that we do not have endoform it is on order. 1/6; the patient's wound on the left lateral lower leg has healed. Still has 1 on the right medial. 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 Using compression pumps at home twice a day. 1/13; left lateral lower leg is still healed. He has a deep punched out rectangular shaped wound on the right medial calf. Looking down at this it appears that he is attempting to epithelialize around the edges of the wound and  on the base as well. His edema is reasonably well controlled we have been using collagen with absolutely no effect 1/20; left lateral lower leg remains closed he has extremitease stockings. The area on the right medial calf I aggressively debrided last week measures larger but the surface looks better. We have been using Hydrofera Blue. We ran Oasis through his insurance but we have not seen the results of this 1/27; left lower leg wound with chronic venous insufficiency and secondary lymphedema. I did aggressive debridement on this last week the wound seems to have come in healthy looking surface using Hydrofera Blue. He was denied for Oasis 2/3; small divot in the right medial lower leg. Under illumination the walls of this divot are epithelialized however the base has slough which I removed with a curette we have been using Hydrofera Blue 2/10 small divot on the right medial lower leg pinpoint illumination at the base of this cone-shaped wound. We have been using Hydrofera Blue but I will switch to calcium alginate this week 2/17; the small divot on the right medial lower leg is fully epithelialized. There is no visible open area under illumination. He has his own stocking for the right leg similar to the one he has been wearing on the left. 03/26/2022; READMISSION This is a now 77 year old man that we had in the clinic from 02/23/2020 through 07/05/2020. At that point he had bilateral lower extremity wounds  left greater than right in the setting of severe lymphedema. He had already obtained compression pumps ordered for him I think from the wound care clinic in [facility] so I do not really have record of what he has been using. He claims to be using them once a day but there is a problem with the sleeve on the left leg. About 2 weeks ago he was hospitalized from 03/11/2022 through 03/14/2022 with diastolic congestive heart failure. 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 He was diuresed. Developed some prerenal azotemia and he has not been taking any diuretics currently. He has not been putting stockings on his legs since he got out of hospital and still has his legs dependent for long periods. Past medical history history of prostate cancer treated with prostatectomy and radiation this was apparently about 8 years ago, history of DVT on chronic Coumadin, history of lymphedema was managed for a while at the clinic in [place]. History of inguinal hernia repair in September 22, hypertension, stage IIIb chronic renal failure ABIs today were noncompressible on the right 1.12 on the left 04-02-2022 upon evaluation today patient appears to be doing well currently in regard to his legs I do feel like both areas that are draining are actually much drier than they were in the picture last week although the left is drier than the right. He is tolerating the 4-layer compression wraps at this point he did contact the pump company and they are actually working on getting him a new compression sleeve for one of his legs which have previously popped and was not functioning properly. 04-23-2022 upon evaluation today patient appears to be doing well currently in regard to his wounds on the legs. I am actually very pleased with where things stand and I do feel like that we are headed in the right direction. Fortunately there is no sign of active infection  locally or systemically at this time. [name], [name] ([id number]) [id number] Page 5 of 15 05-07-2022 upon evaluation today patient  appears to be doing well currently in regard to his wounds in fact things are showing signs of improvement which is good news I do not see too much that actually appears to be open and I am very pleased in that regard. No fevers, chills, nausea, vomiting, or diarrhea. 05-21-2022 upon evaluation today patient appears to be doing somewhat poorly in regard to drainage of his lower extremities bilaterally. The right is greater than left as far as the weeping area. Nonetheless it seems to be getting worse not better. He actually has pitting edema which is at least 2+ to the thighs and I am concerned about the fact that he is may be fluid overloaded in general and that is the reason why we cannot get this under control. I know he is not using his pumps all the time because he actually told the nurse that he was either going to pump or he was going to use his fluid pills but not do both. For that reason I do think that he needs to be really doing both in order to get the fluid out as effectively as possible obviously with the 4-layer compression wraps were doing as much as we can from a compression standpoint but it is really not enough. He tells me that he elevates his leg is much as he can in between pumping and other activity throughout the day. 05-28-2022 upon evaluation today patient appears to be doing better in regard to his wounds although the measurements may be a little bit larger this is a very difficult wound to heal it is very indistinct in a lot of areas. Nonetheless there is can be some need for sharp debridement in regard to both medial and lateral legs. Fortunately I see no signs of active infection locally nor systemically at this time. No fevers, chills, nausea, vomiting, or diarrhea. 06-04-2022 upon evaluation today patient appears to  be doing poorly in general in regard to the wounds on his legs. He still continues to have a tremendous amount of fluid not just in the lower portion of his leg but to be honest his thigh where he has 2-3+ pitting edema in the thigh as well. Unfortunately I do not know that we will be able to get this healed effectively and keep it healed on the lower extremities unless he gets the overall fluid situation taking and under control. Fortunately I do not see any signs of infection locally nor systemically which is great news. He just seems to be very fluid overloaded. 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 He has been tolerating the dressing changes with the compression wraps without complication. Fortunately I do not see any evidence of infection at this time which is great news. No fevers, chills, nausea, vomiting, or diarrhea. 06-18-2022 upon evaluation today patient appears to be doing well currently in regard to his wounds as far as not looking like they are terribly infected but nonetheless I am concerned about a subacute infection secondary to the fact that he continues to have spreading despite the compression therapy. We actually did do an Unna boot on him last week this is actually the first wrap that actually stayed up everything else has been sliding down quite significantly. Fortunately there does not appear to be any signs of infection systemically at this time. With that being said I do believe that locally there seems to be an issue going on here  and again I Ernie Hew do a PCR culture to see what that shows also think that I am going to put him on a broad-spectrum antibiotic, doxycycline to see how that will help as well. He does tell me that coming into the clinic today that he was feeling short of breath like "he was about to have a heart attack" because he was having such a  hard time breathing. He says that he told this to Dr. Jodelle Green his cardiologist as well when he was evaluated in the past 1 to 2 weeks. 07-02-2022 upon evaluation today patient appears to be doing poorly currently in regard to his wound. He has been tolerating the dressing changes. Unfortunately he has not had any compression wraps on for the past week because he was unable to make it in for his appointment last week. With that being said he has a significant amount of drainage he tells me has been using his pumps but despite this in the pumps he still has been draining quite a bit. The drainage is also somewhat purulent unfortunately. We did attempt to get in touch with his cardiologist last week unfortunately we were unable to get up with him I did advise that the patient needs to get in touch with him upon leaving today in order to make sure they know he is on the new antibiotics I am going to send him this will be Levaquin and Augmentin. 07-09-2022 upon evaluation today patient appears to be doing about the same in regard to his legs he may have just a slight amount of improvement with regard to the drainage probably Keystone topical antibiotics are helping in this regard to some degree. Fortunately there does not appear to be any signs of active infection systemically which is great news. No fevers, chills, nausea, vomiting, or diarrhea. 07-16-2022 upon evaluation today patient appears to be doing well currently in regard to his wound. He has been tolerating the dressing changes without complication. Fortunately there does not appear to be any signs of active infection locally nor systemically at this time. With that being said he cannot keep the wraps up he tells me on the left side he had to cut this down because it got too tight. He has been using his pumps but he is on the right side the wrap actually straight down causing some pushing around the central part of his leg just below the calf I think  this is a bigger risk for him that help at this point. I think that we may need to try something different he should be getting his compression socks shortly he tells me they were ordered last Thursday. 3/6; ; this is a patient who lives in [place]. He has severe bilateral lymphedema. He has compression pumps, we have been using kerlix Ace wrap Keystone. He is changing the dressing. We do not have home health. 08-06-2022 upon evaluation today patient appears to be doing a little better in regard to his wounds in general at this point. Fortunately there does not appear to be any signs of active infection locally nor systemically at this time which is great news and overall I am extremely pleased with where we stand today. 08-13-2022 upon evaluation today patient appears to actually be doing significantly better compared to last week. He actually did go to the hospital I told him that he needed to when he left here and he actually did go. With that being said they actually ended up admitting him he  was having shortness of breath and I thought it might be related to congestive heart failure turns out he actually had a pulmonary embolism. Subsequently they were able to get him off of the Coumadin switching over to Eliquis to get things stabilized in that regard they also had them wrapped and got his swelling under control on his legs he actually looks much better pretty much across the board at this point. I am very pleased in that regard. With that being said I am very happy that he finally went that could have been a very dangerous situation. 08-20-2022 upon evaluation today patient appears to be doing well currently in regard to his wound. Has been tolerating the dressing changes without complication. Fortunately there does not appear to be any signs of active infection locally nor systemically at this time. I think his legs are doing better there is some need for sharp debridement today. 08-27-2022 upon  evaluation patient is actually making excellent progress. I am actually very pleased with where he stands and I think that he is moving in the right direction. In general I think that we are looking pretty good at the moment. 09-03-2022 upon evaluation today patient appears to be doing well currently in regard to his wound. He is actually tolerating dressing changes on the left and right leg without complication. Fortunately I do not see any need for debridement of the left leg the right leg I think we probably do some need to perform some debridement here. 09-10-2022 upon evaluation today patient's wounds actually showed signs of improvement in both legs I do not see much is going require debridement today which was great news. Fortunately I do not see any evidence of infection which I think is also excellent he seems to be using his pumps and doing everything right I am happy about how this is progressing at this point. 09-17-2022 upon evaluation today patient appears to be doing decently well in regard to his wounds. He has been tolerating the dressing changes without complication. Fortunately there does not appear to be any signs of active infection at this time which is good news. 09-24-2022 upon evaluation today patient appears to be doing well currently in regard to his wounds. He has been tolerating the dressing changes without complication. Fortunately there does not appear to be any signs of active infection locally nor systemically which is great news. No fevers, chills, nausea, vomiting, or diarrhea. 10-08-2022 upon evaluation today patient appears to be doing excellent currently in regard to his wound. He has been tolerating the dressing changes without complication though it does not sound like he has been using his compression wraps for a bit here. He does think he was doing better with the [facility] topical antibiotics we can definitely go back to using that but I think the biggest issue here  is that his swelling is just very out of control and needs to be under control. I discussed that with him today. 10-15-2022 upon evaluation today patient appears to be doing well currently in regard to his wounds. He is actually making some progress which is good news. Fortunately I do not see any evidence of active infection locally nor systemically which is great news as well. No fevers, chills, nausea, vomiting, or diarrhea. Canaday, [name] ([id number]) [id number] Page 6 of 15 He does have a callused area on the plantar aspect of his left foot which is actually causing some pain and he wonders if I can trim this down for  him. 10-22-2022 upon evaluation today patient seems to be making progress. He is actually doing quite well and very pleased in that regard. I do not see any signs of active infection at this time. 11-05-2022 upon evaluation patient appears to be making progress although slowly towards closure. He seems to be doing well with regard to his legs were still using the [facility] topical antibiotics and he seems to be doing quite well. He is going require some sharp debridement today. 11-19-2022 upon evaluation today patient unfortunately appears to be extremely swollen at this point. He tells me that he ran out of supplies he also tells me his leg started leaking more because of not having supplies he was unable to wear his compression wraps that is the juxta fit compression wraps. Therefore his legs are extremely swollen much larger than normal and do not appear to be doing well at all today. He is going require some debridement I also think he is going require [place] to perform compression wrapping today. 11-26-2022 upon evaluation today patient appears to be doing well currently in regard to his legs as far as infection is concerned I see nothing that appears to be infected. Fortunately I do not see any signs of active infection systemically either which is also good  news. With that being said he still is extremely swollen as far as his legs are concerned. I do not see any signs of overall worsening but also do not see any signs of significant improvement which is the major issue here. 12-03-2022 upon evaluation today patient appears to be doing poorly still in regard to his legs the apparently has been taken off the wraps and not leaving the week by week. With that being said he has not had really the dressings to put on the release that is running out and subsequently though he is using his wraps I am not sure that he has been keeping them on like he needs to. I explained to him I really wanted to have the wraps that I put on him left on until he comes back to see me so that we can keep the compression under better control. He voiced understanding today he tells me that he was "confused about that". Nonetheless based on what I am seeing right now also think that he is up on his feet too much I think he needs to get the feet elevated. Electronic Signature(s) Signed: 12/03/2022 4:22:28 PM By: [name] PA-C Entered By: [name] on 12/03/2022 16:22:28 -------------------------------------------------------------------------------- Physical Exam Details Patient Name: Date of Service: Castronova, [name] 12/03/2022 3:00 PM Medical Record Number: [id number] Patient Account Number: [id number] Date of Birth/Sex: Treating RN: 08/23/1945 (77 y.o. Male) Primary Care Provider: [name] Other Clinician: Referring Provider: Treating Provider/Extender: [name] Weeks in Treatment: 81 Constitutional Obese and well-hydrated in no acute distress. Respiratory normal breathing without difficulty. Psychiatric this patient is able to make decisions and demonstrates good insight into disease process. Alert and Oriented x 3. pleasant and cooperative. Notes Upon inspection patient's wound bed actually showed signs of significant swelling of the  bilateral lower extremities. Again I do not think he is elevating his legs much as he should. He does tell me he is using his pumps twice a day along with taking his fluid pills and again I know he has been having some issues keeping the compression that we put on I explained to him today that is going to be an absolute  necessity at this time. Electronic Signature(s) Signed: 12/03/2022 4:22:50 PM By: [name] PA-C Entered By: [name] on 12/03/2022 16:22:50 -------------------------------------------------------------------------------- Physician Orders Details Patient Name: Date of Service: Knecht, [name] 12/03/2022 3:00 PM Medical Record Number: [id number] Patient Account Number: [id number] Date of Birth/Sex: Treating RN: Apr 22, 1946 (76 y.o. Male) [name] Primary Care Provider: [name] Other Clinician: Referring Provider: Treating Provider/Extender: [name] [name], [name] ([id number]) [id number] Page 7 of 15 Weeks in Treatment: 65 Verbal / Phone Orders: No Diagnosis Coding ICD-10 Coding Code Description I87.333 Chronic venous hypertension (idiopathic) with ulcer and inflammation of bilateral lower extremity I89.0 Lymphedema, not elsewhere classified 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 other part of right lower leg with other specified severity L84 Corns and callosities Follow-up Appointments ppointment in 1 week. [name] PA-C Wednesday 12/10/22 at 2:45pm Return A 12/17/22 at 2pm Other: - Please ask your Primary Care Practioner or Foot Doctor to order orthotic shoes. Anesthetic (In clinic) Topical Lidocaine 5% applied to wound bed Bathing/ Shower/ Hygiene May shower and wash wound with soap and water. - apply clean dressing after bathing. Do not soak legs in tub, may rinse in shower Edema Control -  Lymphedema / SCD / Other Lymphedema Pumps. Use Lymphedema pumps on leg(s) 2-3 times a day for 45-60 minutes. If wearing any wraps or hose, do not remove them. Continue exercising as instructed. - *****pump 3 times a day for an hour each time.***** Elevate legs to the level of the heart or above for 30 minutes daily and/or when sitting for 3-4 times a day throughout the day. - +++ Important+++ ELEVATE THE LEGS>>>>>>>>>> Avoid standing for long periods of time. Exercise regularly Compression stocking or Garment 30-40 mm/Hg pressure to: - Juxtafit Essentials Long for bilateral lower legs; quantity three juxtafit essentials per limb. Other Edema Control Orders/Instructions: - When sitting please keep your feet up, keep legs at Heart Level or above. Use pillows behind calf to for comfort. Off-Loading Other: - When sitting please keep your feet up, keep legs at Heart Level or above. Use pillows behind calf to for comfort. Additional Orders / Instructions Follow Nutritious Diet - Try and increase protein intake. Goal of protein intake is 60g-100g. Wound Treatment Wound #11 - Lower Leg Wound Laterality: Left, Posterior Cleanser: Soap and Water 1 x Per Week/30 Days Discharge Instructions: May shower and wash wound with dial antibacterial soap and water prior to dressing change. Peri-Wound Care: Sween Lotion (Moisturizing lotion) 1 x Per Week/30 Days Discharge Instructions: Apply moisturizing lotion as directed Topical: Keystone 1 x Per Week/30 Days Discharge Instructions: On Hold -Do not use for now Prim Dressing: Hydrofera Blue Ready Transfer Foam, 4x5 (in/in) 1 x Per Week/30 Days ary Discharge Instructions: Apply to wound bed as instructed Secondary Dressing: ABD Pad, 5x9 1 x Per Week/30 Days Discharge Instructions: Apply over primary dressing as directed. Secondary Dressing: Woven Gauze Sponge, Non-Sterile 4x4 in 1 x Per Week/30 Days Discharge Instructions: Apply over primary dressing as  directed. Secondary Dressing: Zetuvit Plus 4x8 in 1 x Per Week/30 Days Discharge Instructions: Apply over primary dressing as directed. Compression Wrap: FourPress (4 layer compression wrap) 1 x Per Week/30 Days Discharge Instructions: Apply four layer compression as directed. May also use Urgo K2 compression system as alternative. Compression Wrap: Juxtafit 30-33mmHg 1 x Per Week/30 Days Discharge Instructions: On Hold for now (11/19/22) apply in the  morning and remove at night. Wound #6 - Lower Leg Wound Laterality: Right, Medial Cleanser: Soap and Water 1 x Per Week/30 Days Discharge Instructions: May shower and wash wound with dial antibacterial soap and water prior to dressing change. Peri-Wound Care: Sween Lotion (Moisturizing lotion) 1 x Per Week/30 Days Discharge Instructions: Apply moisturizing lotion as directed [name], [name] ([id number]) [id number] Page 8 of 15 Topical: Keystone 1 x Per Week/30 Days Discharge Instructions: On Hold -Do not use for now Prim Dressing: Hydrofera Blue Ready Transfer Foam, 4x5 (in/in) 1 x Per Week/30 Days ary Discharge Instructions: Apply to wound bed as instructed Secondary Dressing: ABD Pad, 5x9 1 x Per Week/30 Days Discharge Instructions: Apply over primary dressing as directed. Secondary Dressing: Woven Gauze Sponge, Non-Sterile 4x4 in 1 x Per Week/30 Days Discharge Instructions: Apply over primary dressing as directed. Secondary Dressing: Zetuvit Plus 4x8 in 1 x Per Week/30 Days Discharge Instructions: Apply over primary dressing as directed. Compression Wrap: FourPress (4 layer compression wrap) 1 x Per Week/30 Days Discharge Instructions: Apply four layer compression as directed. May also use Urgo K2 compression system as alternative. Compression Wrap: Juxtafit 30-72mmHg 1 x Per Week/30 Days Discharge Instructions: On Hold for now (11/19/22) apply in the morning and remove at night. Wound #7 - Lower Leg Wound  Laterality: Left, Medial Cleanser: Soap and Water 1 x Per Week/30 Days Discharge Instructions: May shower and wash wound with dial antibacterial soap and water prior to dressing change. Peri-Wound Care: Sween Lotion (Moisturizing lotion) 1 x Per Week/30 Days Discharge Instructions: Apply moisturizing lotion as directed Topical: Keystone 1 x Per Week/30 Days Discharge Instructions: On Hold -Do not use for now Prim Dressing: Hydrofera Blue Ready Transfer Foam, 4x5 (in/in) 1 x Per Week/30 Days ary Discharge Instructions: Apply to wound bed as instructed Secondary Dressing: ABD Pad, 5x9 1 x Per Week/30 Days Discharge Instructions: Apply over primary dressing as directed. Secondary Dressing: Woven Gauze Sponge, Non-Sterile 4x4 in 1 x Per Week/30 Days Discharge Instructions: Apply over primary dressing as directed. Secondary Dressing: Zetuvit Plus 4x8 in 1 x Per Week/30 Days Discharge Instructions: Apply over primary dressing as directed. Compression Wrap: FourPress (4 layer compression wrap) 1 x Per Week/30 Days Discharge Instructions: Apply four layer compression as directed. May also use Urgo K2 compression system as alternative. Compression Wrap: Juxtafit 30-63mmHg 1 x Per Week/30 Days Discharge Instructions: On Hold for now (11/19/22) apply in the morning and remove at night. Electronic Signature(s) Signed: 12/03/2022 4:26:17 PM By: [name] PA-C Signed: 12/03/2022 5:37:42 PM By: [name] RN Entered By: [name] on 12/03/2022 16:07:25 -------------------------------------------------------------------------------- Problem List Details Patient Name: Date of Service: [name], [name] 12/03/2022 3:00 PM Medical Record Number: [id number] Patient Account Number: [id number] Date of Birth/Sex: Treating RN: 1945-05-21 (77 y.o. Male) Primary Care Provider: [name] Other Clinician: Referring Provider: Treating Provider/Extender: [name] in  Treatment: 6 Active Problems [name], [name] ([id number]) [id number] Page 9 of 15 ICD-10 Encounter Code Description Active Date MDM Diagnosis I87.333 Chronic venous hypertension (idiopathic) with ulcer and inflammation of 06/11/2022 No Yes bilateral lower extremity I89.0 Lymphedema, not elsewhere classified 03/26/2022 No Yes L97.828 Non-pressure chronic ulcer of other part of left lower leg with other specified 03/26/2022 No Yes severity 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 other part of right lower leg with other specified 03/26/2022 No Yes severity L84 Corns and callosities 10/15/2022 No Yes Inactive Problems Resolved Problems Electronic Signature(s) Signed: 12/03/2022 3:23:24 PM By: [name] PA-C Entered  By: [name] on 12/03/2022 15:23:24 -------------------------------------------------------------------------------- Progress Note Details Patient Name: Date of Service: [name], [name] 12/03/2022 3:00 PM Medical Record Number: [id number] Patient Account Number: [id number] Date of Birth/Sex: Treating RN: Sep 27, 1945 (77 y.o. Male) Primary Care Provider: [name] Other Clinician: Referring Provider: Treating Provider/Extender: [name] Weeks in Treatment: 68 Subjective Chief Complaint Information obtained from Patient 02/23/2020; patient is here for wounds on his bilateral lower legs in the setting of severe lymphedema 03/26/2022; patient is here for wounds on his bilateral lower legs medial aspect History of Present Illness (HPI) ADMISSION 02/23/2020 Patient is a 77 year old man who lives in [place] who arrives accompanied by his wife. He has a history of chronic lymphedema and venous insufficiency in his bilateral lower legs which may have something to do that with having a history of DVT as well as being treated for prostate cancer. In any case he recently got compression pumps at home but compliance has been an  issue here. He has compression stockings however they are probably not sufficient enough to control swelling. They tell us that things deteriorated for him in late August he was admitted to [facility] for 7 days. This was with cellulitis I think of his bilateral lower legs. Discharge he was noted to have wounds on his bilateral lower legs. He was discharged on Bactrim. They tried to get him home health through [facility] of course they declined him. His wife is been wrapping these applying some form of silver foam dressing. He has a history of wounds before although nothing that would not heal with basic home topical dressings. He has 2 areas on the left medial, left anterior and left lateral and a smaller area on the right medial. All of these have considerable depth. Past medical history includes iron deficiency anemia, lymphedema followed by the rehab center at [facility] with lymphedema wraps I believe, DVT on chronic anticoagulation, prostate cancer, chronic venous insufficiency, hypertension. As mentioned he has compression pumps but does not use them. ABIs in our clinic were noncompressible bilaterally Medal, Caliber ([id number]) [phone].pdf Page 10 of 15 10/14; patient with severe bilateral lymphedema right greater than left. He came in with bilateral lower extremity wounds left greater than right. Even though the right side has more of the edema most of the wounds here almost closed on the right medial. He has 3 remaining wounds on the left We have been using silver alginate under 4-layer compression I have been trying to get him to be compliant with his external compression pumps 10/21; patient with 3 small wounds on the left leg and 1 on the right medial in the setting of severe lymphedema and chronic venous insufficiency. We have been using silver alginate under 4-layer compression he is using his external compression pumps twice a  day 11/4; ARTERIAL STUDIES on the right show an ABI of 1.02 TBI of 0.858 with biphasic waveforms on the left 0.98 with a TBI of 0.55 and biphasic waveforms. Does not look like he has significant arterial disease. We are treating him for lymphedema he has compression pumps. He has punched-out areas on the left anterior left lateral and right medial lower extremities 11/11; after we obtained his arterial studies I put him in 4 layer compression. He is using his compression pumps probably once a day although I have asked him to do twice. Primary dressing to the wound is silver collagen he has severe lymphedema likely secondary to  chronic venous insufficiency. Wounds on the left lateral, left medial and left anterior and a small area on the right medial 12/2; the area on the right anterior lower leg has healed. We initially thought that the area medially had healed as well however when her discharge nurse came in she detected fluid in the wound simply opened up. This is actually worse than I remember this pain. The area on the left lateral potentially slightly smaller He is also complaining about pain in his left hand he says that this is actually been getting some better he has been using topical creams on this. She asked that I look at this 12/9 after last weeks issues we have 2 wounds one on the right medial lower leg and 1 on the left lateral. Both of these are in the same condition. I think because of thickened skin secondary to chronic lymphedema these wounds actually have depth of almost 0.8 cm. 12/16; the patient has 2 small but deep wounds one on the right medial and one on the left lateral. The right medial is actually the worst of these. He arrives in clinic today with absolutely terrible edema in the right leg apparently his 4-layer wrap fell down to just above his ankle he did not think about this he is apparently been continuing to use his compression pump twice a day. The left leg looks a  lot better. 05/09/2020 upon evaluation today patient appears to be doing decently well in regard to his wounds. Everything is measuring smaller the right leg still has a little bit deeper wound in the left seems to be almost completely healed in my opinion I am very pleased in general with how things are progressing. He has a 4- layer compression wrap we have been using endoform today we will probably have to use collagen just based on the fact that we do not have endoform it is on order. 1/6; the patient's wound on the left lateral lower leg has healed. Still has 1 on the right medial. 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 Using compression pumps at home twice a day. 1/13; left lateral lower leg is still healed. He has a deep punched out rectangular shaped wound on the right medial calf. Looking down at this it appears that he is attempting to epithelialize around the edges of the wound and on the base as well. His edema is reasonably well controlled we have been using collagen with absolutely no effect 1/20; left lateral lower leg remains closed he has extremitease stockings. The area on the right medial calf I aggressively debrided last week measures larger but the surface looks better. We have been using Hydrofera Blue. We ran Oasis through his insurance but we have not seen the results of this 1/27; left lower leg wound with chronic venous insufficiency and secondary lymphedema. I did aggressive debridement on this last week the wound seems to have come in healthy looking surface using Hydrofera Blue. He was denied for Oasis 2/3; small divot in the right medial lower leg. Under illumination the walls of this divot are epithelialized however the base has slough which I removed with a curette we have been using Hydrofera Blue 2/10 small divot on the right medial lower leg pinpoint illumination at the base of this cone-shaped wound. We have been using Hydrofera Blue but I  will switch to calcium alginate this week 2/17; the small divot on the right medial lower leg is fully epithelialized. There is no  visible open area under illumination. He has his own stocking for the right leg similar to the one he has been wearing on the left. 03/26/2022; READMISSION This is a now 77 year old man that we had in the clinic from 02/23/2020 through 07/05/2020. At that point he had bilateral lower extremity wounds left greater than right in the setting of severe lymphedema. He had already obtained compression pumps ordered for him I think from the wound care clinic in [facility] so I do not really have record of what he has been using. He claims to be using them once a day but there is a problem with the sleeve on the left leg. About 2 weeks ago he was hospitalized from 03/11/2022 through 03/14/2022 with diastolic congestive heart failure. 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 He was diuresed. Developed some prerenal azotemia and he has not been taking any diuretics currently. He has not been putting stockings on his legs since he got out of hospital and still has his legs dependent for long periods. Past medical history history of prostate cancer treated with prostatectomy and radiation this was apparently about 8 years ago, history of DVT on chronic Coumadin, history of lymphedema was managed for a while at the clinic in [place]. History of inguinal hernia repair in September 22, hypertension, stage IIIb chronic renal failure ABIs today were noncompressible on the right 1.12 on the left 04-02-2022 upon evaluation today patient appears to be doing well currently in regard to his legs I do feel like both areas that are draining are actually much drier than they were in the picture last week although the left is drier than the right. He is tolerating the 4-layer compression wraps at this point he did contact the pump company  and they are actually working on getting him a new compression sleeve for one of his legs which have previously popped and was not functioning properly. 04-23-2022 upon evaluation today patient appears to be doing well currently in regard to his wounds on the legs. I am actually very pleased with where things stand and I do feel like that we are headed in the right direction. Fortunately there is no sign of active infection locally or systemically at this time. 05-07-2022 upon evaluation today patient appears to be doing well currently in regard to his wounds in fact things are showing signs of improvement which is good news I do not see too much that actually appears to be open and I am very pleased in that regard. No fevers, chills, nausea, vomiting, or diarrhea. 05-21-2022 upon evaluation today patient appears to be doing somewhat poorly in regard to drainage of his lower extremities bilaterally. The right is greater than left as far as the weeping area. Nonetheless it seems to be getting worse not better. He actually has pitting edema which is at least 2+ to the thighs and I am concerned about the fact that he is may be fluid overloaded in general and that is the reason why we cannot get this under control. I know he is not using his pumps all the time because he actually told the nurse that he was either going to pump or he was going to use his fluid pills but not do both. For that reason I do think that he needs to be really doing both in order to get the fluid out as effectively as possible obviously with the 4-layer compression wraps were doing  as much as we can from a compression standpoint but it is really not enough. He tells me that he elevates his leg is much as he can in between pumping and other activity throughout the day. 05-28-2022 upon evaluation today patient appears to be doing better in regard to his wounds although the measurements may be a little bit larger this is a very difficult  wound to heal it is very indistinct in a lot of areas. Nonetheless there is can be some need for sharp debridement in regard to both medial and lateral legs. Fortunately I see no signs of active infection locally nor systemically at this time. No fevers, chills, nausea, vomiting, or diarrhea. 06-04-2022 upon evaluation today patient appears to be doing poorly in general in regard to the wounds on his legs. He still continues to have a tremendous amount of fluid not just in the lower portion of his leg but to be honest his thigh where he has 2-3+ pitting edema in the thigh as well. Unfortunately I do not know that we will be able to get this healed effectively and keep it healed on the lower extremities unless he gets the overall fluid situation taking and under control. Fortunately I do not see any signs of infection locally nor systemically which is great news. He just seems to be very fluid overloaded. [name], [name] ([id number]) [id number] Page 11 of 15 06-11-2022 upon evaluation today patient presents for follow-up concerning his bilateral lower extremity lymphedema secondary to chronic venous insufficiency. He has been tolerating the dressing changes with the compression wraps without complication. Fortunately I do not see any evidence of infection at this time which is great news. No fevers, chills, nausea, vomiting, or diarrhea. 06-18-2022 upon evaluation today patient appears to be doing well currently in regard to his wounds as far as not looking like they are terribly infected but nonetheless I am concerned about a subacute infection secondary to the fact that he continues to have spreading despite the compression therapy. We actually did do an Unna boot on him last week this is actually the first wrap that actually stayed up everything else has been sliding down quite significantly. Fortunately there does not appear to be any signs of infection systemically at this  time. With that being said I do believe that locally there seems to be an issue going on here and again I Ernie Hew do a PCR culture to see what that shows also think that I am going to put him on a broad-spectrum antibiotic, doxycycline to see how that will help as well. He does tell me that coming into the clinic today that he was feeling short of breath like "he was about to have a heart attack" because he was having such a hard time breathing. He says that he told this to Dr. Jodelle Green his cardiologist as well when he was evaluated in the past 1 to 2 weeks. 07-02-2022 upon evaluation today patient appears to be doing poorly currently in regard to his wound. He has been tolerating the dressing changes. Unfortunately he has not had any compression wraps on for the past week because he was unable to make it in for his appointment last week. With that being said he has a significant amount of drainage he tells me has been using his pumps but despite this in the pumps he still has been draining quite a bit. The drainage is also somewhat purulent unfortunately. We did attempt to get in touch with his cardiologist  last week unfortunately we were unable to get up with him I did advise that the patient needs to get in touch with him upon leaving today in order to make sure they know he is on the new antibiotics I am going to send him this will be Levaquin and Augmentin. 07-09-2022 upon evaluation today patient appears to be doing about the same in regard to his legs he may have just a slight amount of improvement with regard to the drainage probably Keystone topical antibiotics are helping in this regard to some degree. Fortunately there does not appear to be any signs of active infection systemically which is great news. No fevers, chills, nausea, vomiting, or diarrhea. 07-16-2022 upon evaluation today patient appears to be doing well currently in regard to his wound. He has been tolerating the dressing changes  without complication. Fortunately there does not appear to be any signs of active infection locally nor systemically at this time. With that being said he cannot keep the wraps up he tells me on the left side he had to cut this down because it got too tight. He has been using his pumps but he is on the right side the wrap actually straight down causing some pushing around the central part of his leg just below the calf I think this is a bigger risk for him that help at this point. I think that we may need to try something different he should be getting his compression socks shortly he tells me they were ordered last Thursday. 3/6; ; this is a patient who lives in [place]. He has severe bilateral lymphedema. He has compression pumps, we have been using kerlix Ace wrap Keystone. He is changing the dressing. We do not have home health. 08-06-2022 upon evaluation today patient appears to be doing a little better in regard to his wounds in general at this point. Fortunately there does not appear to be any signs of active infection locally nor systemically at this time which is great news and overall I am extremely pleased with where we stand today. 08-13-2022 upon evaluation today patient appears to actually be doing significantly better compared to last week. He actually did go to the hospital I told him that he needed to when he left here and he actually did go. With that being said they actually ended up admitting him he was having shortness of breath and I thought it might be related to congestive heart failure turns out he actually had a pulmonary embolism. Subsequently they were able to get him off of the Coumadin switching over to Eliquis to get things stabilized in that regard they also had them wrapped and got his swelling under control on his legs he actually looks much better pretty much across the board at this point. I am very pleased in that regard. With that being said I am very happy that he  finally went that could have been a very dangerous situation. 08-20-2022 upon evaluation today patient appears to be doing well currently in regard to his wound. Has been tolerating the dressing changes without complication. Fortunately there does not appear to be any signs of active infection locally nor systemically at this time. I think his legs are doing better there is some need for sharp debridement today. 08-27-2022 upon evaluation patient is actually making excellent progress. I am actually very pleased with where he stands and I think that he is moving in the right direction. In general I think that we are  looking pretty good at the moment. 09-03-2022 upon evaluation today patient appears to be doing well currently in regard to his wound. He is actually tolerating dressing changes on the left and right leg without complication. Fortunately I do not see any need for debridement of the left leg the right leg I think we probably do some need to perform some debridement here. 09-10-2022 upon evaluation today patient's wounds actually showed signs of improvement in both legs I do not see much is going require debridement today which was great news. Fortunately I do not see any evidence of infection which I think is also excellent he seems to be using his pumps and doing everything right I am happy about how this is progressing at this point. 09-17-2022 upon evaluation today patient appears to be doing decently well in regard to his wounds. He has been tolerating the dressing changes without complication. Fortunately there does not appear to be any signs of active infection at this time which is good news. 09-24-2022 upon evaluation today patient appears to be doing well currently in regard to his wounds. He has been tolerating the dressing changes without complication. Fortunately there does not appear to be any signs of active infection locally nor systemically which is great news. No fevers, chills,  nausea, vomiting, or diarrhea. 10-08-2022 upon evaluation today patient appears to be doing excellent currently in regard to his wound. He has been tolerating the dressing changes without complication though it does not sound like he has been using his compression wraps for a bit here. He does think he was doing better with the [facility] topical antibiotics we can definitely go back to using that but I think the biggest issue here is that his swelling is just very out of control and needs to be under control. I discussed that with him today. 10-15-2022 upon evaluation today patient appears to be doing well currently in regard to his wounds. He is actually making some progress which is good news. Fortunately I do not see any evidence of active infection locally nor systemically which is great news as well. No fevers, chills, nausea, vomiting, or diarrhea. He does have a callused area on the plantar aspect of his left foot which is actually causing some pain and he wonders if I can trim this down for him. 10-22-2022 upon evaluation today patient seems to be making progress. He is actually doing quite well and very pleased in that regard. I do not see any signs of active infection at this time. 11-05-2022 upon evaluation patient appears to be making progress although slowly towards closure. He seems to be doing well with regard to his legs were still using the [facility] topical antibiotics and he seems to be doing quite well. He is going require some sharp debridement today. 11-19-2022 upon evaluation today patient unfortunately appears to be extremely swollen at this point. He tells me that he ran out of supplies he also tells me his leg started leaking more because of not having supplies he was unable to wear his compression wraps that is the juxta fit compression wraps. Therefore his legs are extremely swollen much larger than normal and do not appear to be doing well at all today. He is going require some  debridement I also think he is going require [place] to perform compression wrapping today. 11-26-2022 upon evaluation today patient appears to be doing well currently in regard to his legs as far as infection is concerned I see nothing that appears  to be infected. Fortunately I do not see any signs of active infection systemically either which is also good news. With that being said he still is extremely swollen as far as his legs are concerned. I do not see any signs of overall worsening but also do not see any signs of significant improvement which is the major issue here. 12-03-2022 upon evaluation today patient appears to be doing poorly still in regard to his legs the apparently has been taken off the wraps and not leaving the Figuero, [facility] ([id number]) [id number].pdf Page 12 of 15 week by week. With that being said he has not had really the dressings to put on the release that is running out and subsequently though he is using his wraps I am not sure that he has been keeping them on like he needs to. I explained to him I really wanted to have the wraps that I put on him left on until he comes back to see me so that we can keep the compression under better control. He voiced understanding today he tells me that he was "confused about that". Nonetheless based on what I am seeing right now also think that he is up on his feet too much I think he needs to get the feet elevated. Objective Constitutional Obese and well-hydrated in no acute distress. Vitals Time Taken: 3:40 PM, Height: 74 in, Weight: 250 lbs, BMI: 32.1, Temperature: 98.1 F, Pulse: 91 bpm, Respiratory Rate: 16 breaths/min, Blood Pressure: 172/69 mmHg. Respiratory normal breathing without difficulty. Psychiatric this patient is able to make decisions and demonstrates good insight into disease process. Alert and Oriented x 3. pleasant and cooperative. General Notes: Upon inspection patient's wound bed actually  showed signs of significant swelling of the bilateral lower extremities. Again I do not think he is elevating his legs much as he should. He does tell me he is using his pumps twice a day along with taking his fluid pills and again I know he has been having some issues keeping the compression that we put on I explained to him today that is going to be an absolute necessity at this time. Integumentary (Hair, Skin) Wound #11 status is Open. Original cause of wound was Gradually Appeared. The date acquired was: 10/19/2022. The wound has been in treatment 6 weeks. The wound is located on the Left,Posterior Lower Leg. The wound measures 15cm length x 6cm width x 0.1cm depth; 70.686cm^2 area and 7.069cm^3 volume. There is Fat Layer (Subcutaneous Tissue) exposed. There is no tunneling or undermining noted. There is a medium amount of serosanguineous drainage noted. There is small (1-33%) pink granulation within the wound bed. There is a large (67-100%) amount of necrotic tissue within the wound bed including Adherent Slough. The periwound skin appearance had no abnormalities noted for texture. The periwound skin appearance had no abnormalities noted for color. The periwound skin appearance exhibited: Dry/Scaly. The periwound skin appearance did not exhibit: Maceration. Periwound temperature was noted as No Abnormality. Wound #6 status is Open. Original cause of wound was Gradually Appeared. The date acquired was: 03/05/2022. The wound has been in treatment 36 weeks. The wound is located on the Right,Medial Lower Leg. The wound measures 16cm length x 27cm width x 0.2cm depth; 339.292cm^2 area and 67.858cm^3 volume. There is Fat Layer (Subcutaneous Tissue) exposed. There is no tunneling or undermining noted. There is a medium amount of serosanguineous drainage noted. The wound margin is distinct with the outline attached to the wound base. There is small (  1-33%) pink granulation within the wound bed. There is a  large (67-100%) amount of necrotic tissue within the wound bed including Adherent Slough. The periwound skin appearance exhibited: Scarring, Dry/Scaly, Hemosiderin Staining. The periwound skin appearance did not exhibit: Callus, Crepitus, Excoriation, Induration, Rash, Maceration, Atrophie Blanche, Cyanosis, Ecchymosis, Mottled, Pallor, Rubor, Erythema. Wound #7 status is Open. Original cause of wound was Gradually Appeared. The date acquired was: 03/05/2022. The wound has been in treatment 36 weeks. The wound is located on the Left,Medial Lower Leg. The wound measures 3cm length x 2cm width x 0.2cm depth; 4.712cm^2 area and 0.942cm^3 volume. There is Fat Layer (Subcutaneous Tissue) exposed. There is no tunneling or undermining noted. There is a medium amount of serosanguineous drainage noted. The wound margin is distinct with the outline attached to the wound base. There is small (1-33%) pink granulation within the wound bed. There is a large (67-100%) amount of necrotic tissue within the wound bed including Adherent Slough. The periwound skin appearance exhibited: Scarring, Maceration, Hemosiderin Staining. The periwound skin appearance did not exhibit: Callus, Crepitus, Excoriation, Induration, Rash, Dry/Scaly, Atrophie Blanche, Cyanosis, Ecchymosis, Mottled, Pallor, Rubor, Erythema. 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 There was a Selective/Open Wound Skin/Epidermis Debridement with a total area of 70.65 sq cm performed by [name], PA. With  the following instrument(s): Curette to remove Viable and Non-Viable tissue/material. Material removed includes Skin: Dermis and Skin: Epidermis and after achieving pain control using Lidocaine 4% T opical Solution. No specimens were taken. A time out was conducted at 15:47, prior to the start of the procedure. A Minimum amount of bleeding was controlled with Pressure. The procedure was tolerated well with a pain level of 0 throughout and a pain level of 0 following the procedure. Post Debridement Measurements: 15cm length x 6cm width x 0.1cm depth; 7.069cm^3 volume. Character of Wound/Ulcer Post Debridement is improved. Post procedure Diagnosis Wound #11: Same as Pre-Procedure General Notes: Scribed for [name] PA, by J.Scotton. [name], [name] ([id number]) [id number] Page 13 of 15 Pre-procedure diagnosis of Wound #11 is a Lymphedema located on the Left,Posterior Lower Leg . There was a Four Layer Compression Therapy Procedure by [name], RN. Post procedure Diagnosis Wound #11: Same as Pre-Procedure Notes: Or URGO K2. Wound #6 Pre-procedure diagnosis of Wound #6 is a Lymphedema located on the Right,Medial Lower Leg . There was a Selective/Open Wound Skin/Epidermis Debridement with a total area of 33.91 sq cm performed by [name], PA. With the following instrument(s): Curette to remove Viable and Non-Viable tissue/material. Material removed includes Skin: Dermis and Skin: Epidermis and after achieving pain control using Lidocaine 4% T opical Solution. No specimens were taken. A time out was conducted at 15:47, prior to the start of the procedure. A Minimum amount of bleeding was controlled with Pressure. The procedure was tolerated well with a pain level of 0 throughout and a pain level of 0 following the procedure. Post Debridement Measurements: 16cm length x 27cm width x 0.2cm depth; 67.858cm^3 volume. Character of Wound/Ulcer Post Debridement is  improved. Post procedure Diagnosis Wound #6: Same as Pre-Procedure General Notes: Scribed for [employer]  PA, by J.Scotton. Pre-procedure diagnosis of Wound #6 is a Lymphedema located on the Right,Medial Lower Leg . There was a Four Layer Compression Therapy Procedure by [name], RN. Post procedure Diagnosis Wound #6: Same as Pre-Procedure Notes: Or URGO K2. Wound #7 Pre-procedure diagnosis of Wound #7 is a Lymphedema located on the Left,Medial Lower Leg . There was a Selective/Open Wound Skin/Epidermis Debridement with a total area of 4.71 sq cm performed by [name], PA. With the following instrument(s): Curette to remove Viable and Non-Viable tissue/material. Material removed includes Skin: Dermis and Skin: Epidermis and after achieving pain control using Lidocaine 4% T opical Solution. No specimens were taken. A time out was conducted at 15:47, prior to the start of the procedure. A Minimum amount of bleeding was controlled with Pressure. The procedure was tolerated well with a pain level of 0 throughout and a pain level of 0 following the procedure. Post Debridement Measurements: 3cm length x 2cm width x 0.2cm depth; 0.942cm^3 volume. Character of Wound/Ulcer Post Debridement is improved. Post procedure Diagnosis Wound #7: Same as Pre-Procedure General Notes: Scribed for [name] PA, by J.Scotton. Pre-procedure diagnosis of Wound #7 is a Lymphedema located on the Left,Medial Lower Leg . There was a Four Layer Compression Therapy Procedure by [name], RN. Post procedure Diagnosis Wound #7: Same as Pre-Procedure Notes: Or URGO K2. Plan Follow-up Appointments: Return Appointment in 1 week. [name] PA-C Wednesday 12/10/22 at 2:45pm 12/17/22 at 2pm Other: - Please ask your Primary Care Practioner or Foot Doctor to order orthotic shoes. Anesthetic: (In clinic) Topical Lidocaine 5% applied to wound bed Bathing/ Shower/ Hygiene: May shower and wash wound with soap  and water. - apply clean dressing after bathing. Do not soak legs in tub, may rinse in shower Edema Control - Lymphedema / SCD / Other: Lymphedema Pumps. Use Lymphedema pumps on leg(s) 2-3 times a day for 45-60 minutes. If wearing any wraps or hose, do not remove them. Continue exercising as instructed. - *****pump 3 times a day for an hour each time.***** Elevate legs to the level of the heart or above for 30 minutes daily and/or when sitting for 3-4 times a day throughout the day. - +++ Important+++ ELEVATE THE LEGS>>>>>>>>>> Avoid standing for long periods of time. Exercise regularly Compression stocking or Garment 30-40 mm/Hg pressure to: - Juxtafit Essentials Long for bilateral lower legs; quantity three juxtafit essentials per limb. Other Edema Control Orders/Instructions: - When sitting please keep your feet up, keep legs at Heart Level or above. Use pillows behind calf to for comfort. Off-Loading: Other: - When sitting please keep your feet up, keep legs at Heart Level or above. Use pillows behind calf to for comfort. Additional Orders / Instructions: Follow Nutritious Diet - Try and increase protein intake. Goal of protein intake is 60g-100g. WOUND #11: - Lower Leg Wound Laterality: Left, Posterior Cleanser: Soap and Water 1 x Per Week/30 Days Discharge Instructions: May shower and wash wound with dial antibacterial soap and water prior to dressing change. Peri-Wound Care: Sween Lotion (Moisturizing lotion) 1 x Per Week/30 Days Discharge Instructions: Apply moisturizing lotion as directed Topical: Keystone 1 x Per Week/30 Days Discharge Instructions: On Hold -Do not use for now Prim Dressing: Hydrofera Blue Ready Transfer Foam, 4x5 (in/in) 1 x Per Week/30 Days ary Discharge Instructions: Apply to wound bed as instructed Secondary Dressing: ABD Pad, 5x9 1 x Per Week/30 Days Discharge Instructions: Apply over primary dressing as directed. Secondary Dressing: Woven Gauze Sponge,  Non-Sterile 4x4 in 1 x Per Week/30 Days Discharge Instructions: Apply over primary dressing as directed. Secondary Dressing: Zetuvit Plus 4x8 in 1 x Per Week/30 Days Discharge Instructions: Apply over primary dressing as directed. Com pression Wrap: FourPress (4 layer compression wrap) 1 x Per Week/30 Days Discharge Instructions: Apply four layer compression as directed. May also use Urgo K2 compression system as alternative. Com pression Wrap: Juxtafit 30-52mmHg 1 x Per Week/30 Days Discharge Instructions: On Hold for now (11/19/22) apply in the morning and remove at night. WOUND #6: - Lower Leg Wound Laterality: Right, Medial Cleanser: Soap and Water 1 x Per Week/30 Days Discharge Instructions: May shower and wash wound with dial antibacterial soap and water prior to dressing change. Peri-Wound Care: Sween Lotion (Moisturizing lotion) 1 x Per Week/30 Days Discharge Instructions: Apply moisturizing lotion as directed Topical: Keystone 1 x Per Week/30 Days Discharge Instructions: On Hold -Do not use for now Prim Dressing: Hydrofera Blue Ready Transfer Foam, 4x5 (in/in) 1 x Per Week/30 Days ary [name], [name] ([id number]) [id number] Page 14 of 15 Discharge Instructions: Apply to wound bed as instructed Secondary Dressing: ABD Pad, 5x9 1 x Per Week/30 Days Discharge Instructions: Apply over primary dressing as directed. Secondary Dressing: Woven Gauze Sponge, Non-Sterile 4x4 in 1 x Per Week/30 Days Discharge Instructions: Apply over primary dressing as directed. Secondary Dressing: Zetuvit Plus 4x8 in 1 x Per Week/30 Days Discharge Instructions: Apply over primary dressing as directed. Com pression Wrap: FourPress (4 layer compression wrap) 1 x Per Week/30 Days Discharge Instructions: Apply four layer compression as directed. May also use Urgo K2 compression system as alternative. Com pression Wrap: Juxtafit 30-85mmHg 1 x Per Week/30 Days Discharge Instructions:  On Hold for now (11/19/22) apply in the morning and remove at night. WOUND #7: - Lower Leg Wound Laterality: Left, Medial Cleanser: Soap and Water 1 x Per Week/30 Days Discharge Instructions: May shower and wash wound with dial antibacterial soap and water prior to dressing change. Peri-Wound Care: Sween Lotion (Moisturizing lotion) 1 x Per Week/30 Days Discharge Instructions: Apply moisturizing lotion as directed Topical: Keystone 1 x Per Week/30 Days Discharge Instructions: On Hold -Do not use for now Prim Dressing: Hydrofera Blue Ready Transfer Foam, 4x5 (in/in) 1 x Per Week/30 Days ary Discharge Instructions: Apply to wound bed as instructed Secondary Dressing: ABD Pad, 5x9 1 x Per Week/30 Days Discharge Instructions: Apply over primary dressing as directed. Secondary Dressing: Woven Gauze Sponge, Non-Sterile 4x4 in 1 x Per Week/30 Days Discharge Instructions: Apply over primary dressing as directed. Secondary Dressing: Zetuvit Plus 4x8 in 1 x Per Week/30 Days Discharge Instructions: Apply over primary dressing as directed. Com pression Wrap: FourPress (4 layer compression wrap) 1 x Per Week/30 Days Discharge Instructions: Apply four layer compression as directed. May also use Urgo K2 compression system as alternative. Com pression Wrap: Juxtafit 30-19mmHg 1 x Per Week/30 Days Discharge Instructions: On Hold for now (11/19/22) apply in the morning and remove at night. 1. We are going to continue with the 4-layer compression wrap equivalent with the Urgo K2 compression wraps. Working to see where things stand in 1 week's time he should keep this on the entire week. at follow-up 2. I am also can recommend that the patient should continue to elevate his legs he needs to be doing this more than what he is currently. Obviously the more he can elevate the better off he will be in my opinion. 3. I am also going to recommend that he continue with  the silver alginate dressings which she felt like  does better than the [facility] I am okay with that. 4. I am also going to suggest that the patient should continue to monitor for any evidence of infection at this time. Obviously if he develops any fevers, chills, nausea, vomiting, or diarrhea should go to the ER ASAP. We will see patient back for reevaluation in 1 week here in the clinic. If anything worsens or changes patient will contact our office for additional recommendations. Electronic Signature(s) Signed: 12/03/2022 5:37:42 PM By: [name] RN Signed: 12/15/2022 4:49:37 PM By: [name] PA-C Previous Signature: 12/03/2022 4:23:48 PM Version By: [name] PA-C Entered By: [name] on 12/03/2022 17:36:55 -------------------------------------------------------------------------------- SuperBill Details Patient Name: Date of Service: Ratterree, [name] 12/03/2022 Medical Record Number: [id number] Patient Account Number: [id number] Date of Birth/Sex: Treating RN: Jan 14, 1946 (77 y.o. 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 L84 Corns and callosities Facility Procedures : [facility] CPT4 CodeJavel, Hersh ([id number] IC L L Description: ) [id number] 597 - DEBRIDE WOUND 1ST 20 SQ CM OR < D-10 Diagnosis Description 97.828 Non-pressure chronic ulcer of other part of left lower leg with other specified severity 97.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 other part of  right lower leg with other  specified severit Modifier: [id number] Page 1 y Quantity: 15 of 15 : CPT4 Code: [id number] IC L L Description: 598 - DEBRIDE WOUND EA ADDL 20 SQ CM D-10 Diagnosis Description 97.828 Non-pressure chronic ulcer of other part of left lower leg with other specified severity 97.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 other part of right lower leg with other  specified severit Modifier: 2 y Quantity: Physician Procedures : CPT4 Code Description Modifier [id number] - WC PHYS DEBR WO ANESTH 20 SQ CM ICD-10 Diagnosis Description 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 other  part of right lower leg with other specified severity Quantity: 1 : [id number] - WC PHYS DEBR WO ANESTH EA ADD 20 CM ICD-10 Diagnosis Description 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

## 2022-12-05 NOTE — Progress Notes (Signed)
[name] [name] ([id number]) [id number] Page 1 of 11 Visit Report for 12/03/2022 Arrival Information Details Patient Name: Date of Service: [name] [name] [name] 12/03/2022 3:00 PM Medical Record Number: [id number] Patient Account Number: [id number] Date of Birth/Sex: Treating RN: 07-04-1945 (77 y.o. [name] Primary Care Gillian Meeuwsen: [name] Other Clinician: Referring Jessilyn Catino: Treating Jasmon Mattice/Extender: [name] in Treatment: 15 Visit Information History Since Last Visit Added or deleted any medications: No Patient Arrived: Cane Any new allergies or adverse reactions: No Arrival Time: 15:42 Had a fall or experienced change in No Accompanied By: daughter activities of daily living that may affect Transfer Assistance: None risk of falls: Patient Identification Verified: Yes Signs or symptoms of abuse/neglect since last visito No Secondary Verification Process Completed: Yes Hospitalized since last visit: No Patient Requires Transmission-Based Precautions: No Implantable device outside of the clinic excluding No Patient Has Alerts: Yes cellular tissue based products placed in the center Patient Alerts: Patient on Blood Thinner since last visit: Right ABI in clinic [place] Has Dressing in Place as Prescribed: No Has Compression in Place as Prescribed: No Pain Present Now: Yes Electronic Signature(s) Signed: 12/03/2022 5:37:42 PM By: [name] RN Entered By: [name] on 12/03/2022 15:42:46 -------------------------------------------------------------------------------- Compression Therapy Details Patient Name: Date of Service: [name] [name] [name] 12/03/2022 3:00 PM Medical Record Number: [id number] Patient Account Number: [id number] Date of Birth/Sex: Treating RN: [birth year] (77 y.o. [name] Primary Care Bren Borys: [name] Other Clinician: Referring Laurelin Elson: Treating Carvin Almas/Extender:  [name] Weeks in Treatment: 36 Compression Therapy Performed for Wound Assessment: Wound #11 Left,Posterior Lower [name], RN Compression Type: Four Layer Post Procedure Diagnosis Same as Pre-procedure Notes Or Harlene Salts Electronic Signature(s) Signed: 12/03/2022 5:37:42 PM By: [name] RN Entered By: [name] on 12/03/2022 16:00:50 [name] [name] ([id number]) [id number] Page 2 of 11 -------------------------------------------------------------------------------- Compression Therapy Details Patient Name: Date of Service: [name] [name] [name] 12/03/2022 3:00 PM Medical Record Number: [id number] Patient Account Number: [id number] Date of Birth/Sex: Treating RN: 11-24-1945 (77 y.o. [name] Primary Care Dilyn Osoria: [name] Other Clinician: Referring Nakaiya Beddow: Treating Holston Oyama/Extender: [name] Weeks in Treatment: 36 Compression Therapy Performed for Wound Assessment: Wound #6 Right,Medial Lower [name], RN Compression Type: Four Layer Post Procedure Diagnosis Same as Pre-procedure Notes Or Harlene Salts Electronic Signature(s) Signed: 12/03/2022 5:37:42 PM By: [name] RN Entered By: [name] on 12/03/2022 16:00:50 -------------------------------------------------------------------------------- Compression Therapy Details Patient Name: Date of Service: [name] [name] [name] 12/03/2022 3:00 PM Medical Record Number: [id number] Patient Account Number: [id number] Date of Birth/Sex: Treating RN: Apr 19, 1946 (77 y.o. [name] Primary Care Avea Mcgowen: [name] Other Clinician: Referring Genevra Orne: Treating Burhanuddin Kohlmann/Extender: [name] Weeks in Treatment: 36 Compression Therapy Performed for Wound Assessment: Wound #7 Left,Medial Lower [name], RN Compression Type: Four Layer Post Procedure Diagnosis Same as Pre-procedure Notes Or Harlene Salts Electronic Signature(s) Signed: 12/03/2022 5:37:42 PM By: [name] RN Entered By: [name] on 12/03/2022 16:00:50 -------------------------------------------------------------------------------- Encounter Discharge Information Details Patient Name: Date of Service: [name] [name] [name] 12/03/2022 3:00 PM Medical Record Number: [id number] Patient Account Number: [id number] Date of Birth/Sex: Treating RN: 1946/04/07 (77 y.o. [name] Primary Care Doris Mcgilvery: [name] Other Clinician: Referring Pharrell Ledford: Treating Eloise Picone/Extender: [name] [name], [name] [name] ([id number]) [id number] Page 3 of 11 Weeks in Treatment: 26  Encounter Discharge Information Items Post Procedure Vitals Discharge Condition: Stable Temperature (F): 98.1 Ambulatory Status: Walker Pulse (bpm): 91 Discharge Destination: Home Respiratory Rate (breaths/min): 16 Transportation: Private Auto Blood Pressure (mmHg): 172/69 Accompanied By: daughter Schedule Follow-up Appointment: Yes Clinical Summary of Care: Patient Declined Electronic Signature(s) Signed: 12/03/2022 5:37:42 PM By: [name] RN Entered By: [name] on 12/03/2022 17:32:29 -------------------------------------------------------------------------------- Lower Extremity Assessment Details Patient Name: Date of Service: [name] [name] [name] 12/03/2022 3:00 PM Medical Record Number: [id number] Patient Account Number: [id number] Date of Birth/Sex: Treating RN: 1945/10/05 (77 y.o. [name] Primary Care Kamil Hanigan: [name] Other Clinician: Referring Rionna Feltes: Treating Tom Macpherson/Extender: [name] Weeks in Treatment: 36 Edema Assessment Assessed: [Left: No] [Right: No] Edema: [Left: Yes] [Right: Yes] Calf Left: Right: Point of  Measurement: 40 cm From Medial Instep 64 cm 65 cm Ankle Left: Right: Point of Measurement: 13 cm From Medial Instep 43.6 cm 42.9 cm Vascular Assessment Pulses: Dorsalis Pedis Palpable: [Left:Yes] [Right:Yes] Extremity colors, hair growth, and conditions: Extremity Color: [Left:Normal] [Right:Normal] Hair Growth on Extremity: [Left:No] Temperature of Extremity: [Left:Warm > 3 seconds] [Right:Warm > 3 seconds] Electronic Signature(s) Signed: 12/03/2022 5:37:42 PM By: [name] RN Entered By: [name] on 12/03/2022 15:53:36 -------------------------------------------------------------------------------- Multi-Disciplinary Care Plan Details Patient Name: Date of Service: [name] [name] [name] 12/03/2022 3:00 PM Medical Record Number: [id number] Patient Account Number: [id number] Wingler, [name] [name] ([id number]) [phone].pdf Page 4 of 11 Date of Birth/Sex: Treating RN: 10-Dec-1945 (77 y.o. [name] Primary Care Harrington Jobe: Other Clinician: [name] Referring Dandre Sisler: Treating Doug Bucklin/Extender: [name] Weeks in Treatment: 76 Active Inactive Wound/Skin Impairment Nursing Diagnoses: Impaired tissue integrity Knowledge deficit related to ulceration/compromised skin integrity Goals: Patient will have a decrease in wound volume by X% from date: (specify in notes) Date Initiated: 03/26/2022 Target Resolution Date: 01/17/2023 Goal Status: Active Patient/caregiver will verbalize understanding of skin care regimen Date Initiated: 03/26/2022 Target Resolution Date: 01/17/2023 Goal Status: Active Ulcer/skin breakdown will have a volume reduction of 30% by week 4 Date Initiated: 03/26/2022 Date Inactivated: 05/21/2022 Target Resolution Date: 05/17/2022 Unmet Reason: see wound Goal Status: Unmet measurement. Ulcer/skin breakdown will have a volume reduction of 50% by week 8 Date Initiated: 03/26/2022 Date Inactivated:  05/21/2022 Target Resolution Date: 05/17/2022 Unmet Reason: see wound Goal Status: Unmet measurement. Interventions: Assess patient/caregiver ability to obtain necessary supplies Assess patient/caregiver ability to perform ulcer/skin care regimen upon admission and as needed Assess ulceration(s) every visit Notes: Patient stated today, "I will take my fluid pill or pump not do both." Jamez Ambrocio made aware. Electronic Signature(s) Signed: 12/03/2022 5:37:42 PM By: [name] RN Entered By: [name] on 12/03/2022 16:03:29 -------------------------------------------------------------------------------- Pain Assessment Details Patient Name: Date of Service: [name] [name] [name] 12/03/2022 3:00 PM Medical Record Number: [id number] Patient Account Number: [id number] Date of Birth/Sex: Treating RN: [birth year] (77 y.o. [name] Primary Care Rhodia Acres: [name] Other Clinician: Referring Briany Aye: Treating [name]/Extender: [name] Weeks in Treatment: 31 Active Problems Location of Pain Severity and Description of Pain Patient Has Paino Yes Site Locations Pain Location: Criss, [name] [name] ([id number]) [phone].pdf Page 5 of 11 Pain Location: Pain in Ulcers With Dressing Change: No Duration of the Pain. Constant / Intermittento Constant Rate the pain. Current Pain Level: 6 Worst Pain Level: 10 Least Pain Level: 3 Tolerable Pain Level: 6 Character of Pain Describe the Pain: Difficult to Pinpoint Pain Management and Medication Current Pain Management: Medication: Yes Cold Application: No Rest:  Yes Massage: No Activity: No T.E.N.S.: No Heat Application: No Leg drop or elevation: No Is the Current Pain Management Adequate: Inadequate How does your wound impact your activities of daily livingo Sleep: No Bathing: No Appetite: No Relationship With Others: No Bladder Continence: No Emotions: No Bowel  Continence: No Work: No Toileting: No Drive: No Dressing: No Hobbies: No Electronic Signature(s) Signed: 12/03/2022 5:37:42 PM By: [name] RN Entered By: [name] on 12/03/2022 15:53:27 -------------------------------------------------------------------------------- Patient/Caregiver Education Details Patient Name: Date of Service: [name] [name] [name] 7/17/2024andnbsp3:00 PM Medical Record Number: [id number] Patient Account Number: [id number] Date of Birth/Gender: Treating RN: 1946-01-25 (77 y.o. [name] Primary Care Physician: [name] Other Clinician: Referring Physician: Treating Physician/Extender: [name] in Treatment: 29 Education Assessment Education Provided To: Patient Education Topics Provided Wound/Skin Impairment: Methods: Explain/Verbal Responses: Return demonstration correctly Electronic Signature(s) Signed: 12/03/2022 5:37:42 PM By: [name] RN Backstrom, [name] [name] ([id number]) [phone].pdf Page 6 of 11 Entered By: [name] on 12/03/2022 16:03:48 -------------------------------------------------------------------------------- Wound Assessment Details Patient Name: Date of Service: [name] [name] [name] 12/03/2022 3:00 PM Medical Record Number: [id number] Patient Account Number: [id number] Date of Birth/Sex: Treating RN: 09-Oct-1945 (77 y.o. [name] Primary Care Anatasia Tino: [name] Other Clinician: Referring Kellis Topete: Treating Hasna Stefanik/Extender: [name] Weeks in Treatment: 36 Wound Status Wound Number: 11 Primary Lymphedema Etiology: Wound Location: Left, Posterior Lower Leg Wound Open Wounding Event: Gradually Appeared Status: Date Acquired: 10/19/2022 Comorbid Anemia, Lymphedema, Deep Vein Thrombosis, Hypertension, Weeks Of Treatment: 6 History: Received Radiation Clustered Wound: No Photos Wound Measurements Length: (cm)  15 Width: (cm) 6 Depth: (cm) 0. Area: (cm) 7 Volume: (cm) 7 % Reduction in Area: -471.4% % Reduction in Volume: -471.5% 1 Epithelialization: Large (67-100%) 0.686 Tunneling: No .069 Undermining: No Wound Description Classification: Full Thickness Without Exposed Suppor Exudate Amount: Medium Exudate Type: Serosanguineous Exudate Color: red, brown t Structures Wound Bed Granulation Amount: Small (1-33%) Exposed Structure Granulation Quality: Pink Fascia Exposed: No Necrotic Amount: Large (67-100%) Fat Layer (Subcutaneous Tissue) Exposed: Yes Necrotic Quality: Adherent Slough Tendon Exposed: No Muscle Exposed: No Joint Exposed: No Bone Exposed: No Periwound Skin Texture Texture Color No Abnormalities Noted: Yes No Abnormalities Noted: Yes Moisture Temperature / Pain No Abnormalities Noted: No Temperature: No Abnormality Dry / Scaly: Yes Maceration: No Treatment Notes Plamondon, Jonmarc ([id number]) [id number] Page 7 of 11 Wound #11 (Lower Leg) Wound Laterality: Left, Posterior Cleanser Soap and Water Discharge Instruction: May shower and wash wound with dial antibacterial soap and water prior to dressing change. Peri-Wound Care Sween Lotion (Moisturizing lotion) Discharge Instruction: Apply moisturizing lotion as directed Topical Keystone Discharge Instruction: On Hold -Do not use for now Primary Dressing Hydrofera Blue Ready Transfer Foam, 4x5 (in/in) Discharge Instruction: Apply to wound bed as instructed Secondary Dressing ABD Pad, 5x9 Discharge Instruction: Apply over primary dressing as directed. Woven Gauze Sponge, Non-Sterile 4x4 in Discharge Instruction: Apply over primary dressing as directed. Zetuvit Plus 4x8 in Discharge Instruction: Apply over primary dressing as directed. Secured With Compression Wrap FourPress (4 layer compression wrap) Discharge Instruction: Apply four layer compression as directed. May also use Urgo K2  compression system as alternative. Juxtafit 30-26mmHg Discharge Instruction: On Hold for now (11/19/22) apply in the morning and remove at night. Compression Stockings Add-Ons Electronic Signature(s) Signed: 12/03/2022 5:37:42 PM By: [name] RN Signed: 12/04/2022 4:50:33 PM By: Thayer Dallas Entered By: Thayer Dallas on 12/03/2022 15:36:57 -------------------------------------------------------------------------------- Wound Assessment Details Patient Name: Date of Service:  [name] [name] [name] 12/03/2022 3:00 PM Medical Record Number: [id number] Patient Account Number: [id number] Date of Birth/Sex: Treating RN: 15-Nov-1945 (77 y.o. [name] Primary Care Blake Goya: [name] Other Clinician: Referring Avinash Maltos: Treating Barri Neidlinger/Extender: [name] Weeks in Treatment: 36 Wound Status Wound Number: 6 Primary Lymphedema Etiology: Wound Location: Right, Medial Lower Leg Wound Open Wounding Event: Gradually Appeared Status: Date Acquired: 03/05/2022 Comorbid Anemia, Lymphedema, Deep Vein Thrombosis, Hypertension, Weeks Of Treatment: 36 History: Received Radiation Clustered Wound: Yes Photos Balke, [name] [name] ([id number]) [id number] Page 8 of 11 Wound Measurements Length: (cm) Width: (cm) Depth: (cm) Clustered Quantity: Area: (cm) Volume: (cm) 16 % Reduction in Area: -137.4% 27 % Reduction in Volume: -374.7% 0.2 Epithelialization: Medium (34-66%) 1 Tunneling: No 339.292 Undermining: No 67.858 Wound Description Classification: Full Thickness Without Exposed Sup Wound Margin: Distinct, outline attached Exudate Amount: Medium Exudate Type: Serosanguineous Exudate Color: red, brown port Structures Foul Odor After Cleansing: No Slough/Fibrino Yes Wound Bed Granulation Amount: Small (1-33%) Exposed Structure Granulation Quality: Pink Fascia Exposed: No Necrotic Amount: Large (67-100%) Fat Layer (Subcutaneous  Tissue) Exposed: Yes Necrotic Quality: Adherent Slough Tendon Exposed: No Muscle Exposed: No Joint Exposed: No Bone Exposed: No Periwound Skin Texture Texture Color No Abnormalities Noted: No No Abnormalities Noted: No Callus: No Atrophie Blanche: No Crepitus: No Cyanosis: No Excoriation: No Ecchymosis: No Induration: No Erythema: No Rash: No Hemosiderin Staining: Yes Scarring: Yes Mottled: No Pallor: No Moisture Rubor: No No Abnormalities Noted: No Dry / Scaly: Yes Maceration: No Treatment Notes Wound #6 (Lower Leg) Wound Laterality: Right, Medial Cleanser Soap and Water Discharge Instruction: May shower and wash wound with dial antibacterial soap and water prior to dressing change. Peri-Wound Care Sween Lotion (Moisturizing lotion) Discharge Instruction: Apply moisturizing lotion as directed Topical Keystone Discharge Instruction: On Hold -Do not use for now Primary Dressing Hydrofera Blue Ready Transfer Foam, 4x5 (in/in) Discharge Instruction: Apply to wound bed as instructed Secondary Dressing ABD Pad, 5x9 Discharge Instruction: Apply over primary dressing as directed. Gilmore, [name] [name] ([id number]) [id number] Page 9 of 11 Woven Gauze Sponge, Non-Sterile 4x4 in Discharge Instruction: Apply over primary dressing as directed. Zetuvit Plus 4x8 in Discharge Instruction: Apply over primary dressing as directed. Secured With Compression Wrap FourPress (4 layer compression wrap) Discharge Instruction: Apply four layer compression as directed. May also use Urgo K2 compression system as alternative. Juxtafit 30-59mmHg Discharge Instruction: On Hold for now (11/19/22) apply in the morning and remove at night. Compression Stockings Add-Ons Electronic Signature(s) Signed: 12/03/2022 5:37:42 PM By: [name] RN Signed: 12/04/2022 4:50:33 PM By: Thayer Dallas Entered By: Thayer Dallas on 12/03/2022  15:37:28 -------------------------------------------------------------------------------- Wound Assessment Details Patient Name: Date of Service: [name] [name] [name] 12/03/2022 3:00 PM Medical Record Number: [id number] Patient Account Number: [id number] Date of Birth/Sex: Treating RN: [birth year] (77 y.o. [name] Primary Care Adelfa Lozito: [name] Other Clinician: Referring Wyvonne Carda: Treating Nechelle Petrizzo/Extender: [name] Weeks in Treatment: 36 Wound Status Wound Number: 7 Primary Lymphedema Etiology: Wound Location: Left, Medial Lower Leg Wound Open Wounding Event: Gradually Appeared Status: Date Acquired: 03/05/2022 Comorbid Anemia, Lymphedema, Deep Vein Thrombosis, Hypertension, Weeks Of Treatment: 36 History: Received Radiation Clustered Wound: Yes Photos Wound Measurements Length: (cm) Width: (cm) Depth: (cm) Clustered Quantity: Area: (cm) Volume: (cm) 3 % Reduction in Area: 90% 2 % Reduction in Volume: 80% 0.2 Epithelialization: Medium (34-66%) 1 Tunneling: No 4.712 Undermining: No 0.942 Wound Description Classification: Full Thickness Without Exposed Support Structures Wound Margin: Distinct, outline attached  Exudate Amount: Medium [name], Gian ([id number]) Exudate Type: Serosanguineous Exudate Color: red, brown Foul Odor After Cleansing: No Slough/Fibrino Yes [phone].pdf Page 10 of 11 Wound Bed Granulation Amount: Small (1-33%) Exposed Structure Granulation Quality: Pink Fascia Exposed: No Necrotic Amount: Large (67-100%) Fat Layer (Subcutaneous Tissue) Exposed: Yes Necrotic Quality: Adherent Slough Tendon Exposed: No Muscle Exposed: No Joint Exposed: No Bone Exposed: No Periwound Skin Texture Texture Color No Abnormalities Noted: No No Abnormalities Noted: No Callus: No Atrophie Blanche: No Crepitus: No Cyanosis: No Excoriation: No Ecchymosis: No Induration: No Erythema: No Rash:  No Hemosiderin Staining: Yes Scarring: Yes Mottled: No Pallor: No Moisture Rubor: No No Abnormalities Noted: No Dry / Scaly: No Maceration: Yes Treatment Notes Wound #7 (Lower Leg) Wound Laterality: Left, Medial Cleanser Soap and Water Discharge Instruction: May shower and wash wound with dial antibacterial soap and water prior to dressing change. Peri-Wound Care Sween Lotion (Moisturizing lotion) Discharge Instruction: Apply moisturizing lotion as directed Topical Keystone Discharge Instruction: On Hold -Do not use for now Primary Dressing Hydrofera Blue Ready Transfer Foam, 4x5 (in/in) Discharge Instruction: Apply to wound bed as instructed Secondary Dressing ABD Pad, 5x9 Discharge Instruction: Apply over primary dressing as directed. Woven Gauze Sponge, Non-Sterile 4x4 in Discharge Instruction: Apply over primary dressing as directed. Zetuvit Plus 4x8 in Discharge Instruction: Apply over primary dressing as directed. Secured With Compression Wrap FourPress (4 layer compression wrap) Discharge Instruction: Apply four layer compression as directed. May also use Urgo K2 compression system as alternative. Juxtafit 30-27mmHg Discharge Instruction: On Hold for now (11/19/22) apply in the morning and remove at night. Compression Stockings Add-Ons Electronic Signature(s) Signed: 12/03/2022 5:37:42 PM By: [name] RN Signed: 12/04/2022 4:50:33 PM By: Thayer Dallas Entered By: Thayer Dallas on 12/03/2022 15:37:59 Pendergraft, [name] [name] ([id number]) [id number] Page 11 of 11 -------------------------------------------------------------------------------- Vitals Details Patient Name: Date of Service: [name] [name] [name] 12/03/2022 3:00 PM Medical Record Number: [id number] Patient Account Number: [id number] Date of Birth/Sex: Treating RN: 1946-03-08 (77 y.o. [name] Primary Care Piercen Covino: [name] Other Clinician: Referring  Skylan Lara: Treating Suriyah Vergara/Extender: [name] Weeks in Treatment: 75 Vital Signs Time Taken: 15:40 Temperature (F): 98.1 Height (in): 74 Pulse (bpm): 91 Weight (lbs): 250 Respiratory Rate (breaths/min): 16 Body Mass Index (BMI): 32.1 Blood Pressure (mmHg): 172/69 Reference Range: 80 - 120 mg / dl Electronic Signature(s) Signed: 12/03/2022 5:37:42 PM By: [name] RN Entered By: [name] on 12/03/2022 15:51:46

## 2022-12-10 ENCOUNTER — Encounter (HOSPITAL_BASED_OUTPATIENT_CLINIC_OR_DEPARTMENT_OTHER): Payer: Medicare Other | Admitting: Internal Medicine

## 2022-12-10 DIAGNOSIS — I87333 Chronic venous hypertension (idiopathic) with ulcer and inflammation of bilateral lower extremity: Secondary | ICD-10-CM | POA: Diagnosis not present

## 2022-12-11 NOTE — Progress Notes (Signed)
[name], [name] [id number] Page 1 of 13 Visit Report for 12/10/2022 Arrival Information Details Patient Name: Date of Service: [name], [name] 12/10/2022 2:45 PM Medical Record Number: [id number] Patient Account Number: [id number] Date of Birth/Sex: Treating RN: 1945-07-02 (77 y.o. M) Primary Care Shakaria Raphael: [name] Other Clinician: Referring Alvira Hecht: Treating Soffia Doshier/Extender: Stark Klein in Treatment: 37 Visit Information History Since Last Visit Added or deleted any medications: No Patient Arrived: Cane Any new allergies or adverse reactions: No Arrival Time: 14:37 Had a fall or experienced change in No Accompanied By: self activities of daily living that may affect Transfer Assistance: None risk of falls: Patient Identification Verified: Yes Signs or symptoms of abuse/neglect since last visito No Secondary Verification Process Completed: Yes Hospitalized since last visit: No Patient Requires Transmission-Based Precautions: No Implantable device outside of the clinic excluding No Patient Has Alerts: Yes cellular tissue based products placed in the center Patient Alerts: Patient on Blood Thinner since last visit: Right ABI in clinic [place] Has Dressing in Place as Prescribed: Yes Has Compression in Place as Prescribed: Yes Pain Present Now: Yes Electronic Signature(s) Signed: 12/11/2022 4:36:58 PM By: Thayer Dallas Entered By: Thayer Dallas on 12/10/2022 14:39:20 -------------------------------------------------------------------------------- Compression Therapy Details Patient Name: Date of Service: [name], [name] 12/10/2022 2:45 PM Medical Record Number: [id number] Patient Account Number: [id number] Date of Birth/Sex: Treating RN: 08/20/1945 (78 y.o. [name] Primary Care Terald Jump: [name] Other Clinician: Referring Anderson Coppock: Treating Ankita Newcomer/Extender: Valentina Gu Weeks in Treatment: 37 Compression Therapy Performed for Wound Assessment: Wound #11 Left,Posterior Lower Leg Performed By: Clinician [name], RN Compression Type: Double Layer Post Procedure Diagnosis Same as Pre-procedure Electronic Signature(s) Signed: 12/10/2022 6:08:49 PM By: [name] RN, [name] Entered By: [name] on 12/10/2022 15:15:41 Kinker, [name] ([id number]) [id number] Page 2 of 13 -------------------------------------------------------------------------------- Compression Therapy Details Patient Name: Date of Service: [name], [name] 12/10/2022 2:45 PM Medical Record Number: [id number] Patient Account Number: [id number] Date of Birth/Sex: Treating RN: 02-07-1946 (77 y.o. [name] Primary Care Alexiz Sustaita: [name] Other Clinician: Referring Ridhi Hoffert: Treating Koby Pickup/Extender: Valentina Gu Weeks in Treatment: 37 Compression Therapy Performed for Wound Assessment: Wound #6 Right,Medial Lower Leg Performed By: Clinician [name], RN Compression Type: Double Layer Post Procedure Diagnosis Same as Pre-procedure Electronic Signature(s) Signed: 12/10/2022 6:08:49 PM By: [name] RN, [name] Entered By: [name] on 12/10/2022 15:15:48 -------------------------------------------------------------------------------- Compression Therapy Details Patient Name: Date of Service: [name], [name] 12/10/2022 2:45 PM Medical Record Number: [id number] Patient Account Number: [id number] Date of Birth/Sex: Treating RN: 01-06-1946 (76 y.o. [name] Primary Care Katty Fretwell: [name] Other Clinician: Referring Kavir Savoca: Treating Jada Kuhnert/Extender: Valentina Gu Weeks in Treatment: 37 Compression Therapy Performed for Wound Assessment: Wound #7 Left,Medial Lower Leg Performed By: Clinician [name], RN Compression Type: Double Layer Post Procedure  Diagnosis Same as Pre-procedure Electronic Signature(s) Signed: 12/10/2022 6:08:49 PM By: [name] RN, [name] Entered By: [name] on 12/10/2022 15:15:55 -------------------------------------------------------------------------------- Encounter Discharge Information Details Patient Name: Date of Service: [name], [name] 12/10/2022 2:45 PM Medical Record Number: [id number] Patient Account Number: [id number] Date of Birth/Sex: Treating RN: Jul 11, 1945 (77 y.o. [name] Primary Care Billal Rollo: [name] Other Clinician: Referring Omolola Mittman: Treating Jozalyn Baglio/Extender: Stark Klein in Treatment: 37 Encounter Discharge Information Items Discharge Condition: Stable Ambulatory Status: Cane Discharge Destination: Home Transportation: Private Auto Accompanied By: self Schedule Follow-up Appointment: Yes Clinical Summary  of Care: Lynam, [name] [id number] Page 3 of 13 Electronic Signature(s) Signed: 12/10/2022 6:08:49 PM By: [name] RN, [name] Entered By: [name] on 12/10/2022 15:16:47 -------------------------------------------------------------------------------- Lower Extremity Assessment Details Patient Name: Date of Service: Marks, [name] 12/10/2022 2:45 PM Medical Record Number: [id number] Patient Account Number: [id number] Date of Birth/Sex: Treating RN: February 09, 1946 (77 y.o. M) Primary Care Johnross Nabozny: [name] Other Clinician: Referring Natalyia Innes: Treating Marvyn Torrez/Extender: Valentina Gu Weeks in Treatment: 37 Edema Assessment Assessed: Kyra Searles: No] Franne Forts: No] Edema: [Left: Yes] [Right: Yes] Calf Left: Right: Point of Measurement: 40 cm From Medial Instep 51.5 cm 58 cm Ankle Left: Right: Point of Measurement: 13 cm From Medial Instep 40.5 cm 42 cm Vascular Assessment Extremity colors, hair growth, and conditions: Extremity Color: [Left:Normal]  [Right:Normal] Hair Growth on Extremity: [Left:No] Temperature of Extremity: [Left:Warm > 3 seconds] [Right:Warm > 3 seconds] Electronic Signature(s) Signed: 12/11/2022 4:36:58 PM By: Thayer Dallas Entered By: Thayer Dallas on 12/10/2022 14:50:54 -------------------------------------------------------------------------------- Multi Wound Chart Details Patient Name: Date of Service: Bamba, [name] 12/10/2022 2:45 PM Medical Record Number: [id number] Patient Account Number: [id number] Date of Birth/Sex: Treating RN: August 24, 1945 (77 y.o. M) Primary Care Mayli Covington: [name] Other Clinician: Referring Zell Hylton: Treating Keiandre Cygan/Extender: Valentina Gu Weeks in Treatment: 37 Vital Signs Height(in): 74 Pulse(bpm): 86 Weight(lbs): 250 Blood Pressure(mmHg): 161/61 Body Mass Index(BMI): 32.1 Temperature(F): 97.7 Respiratory Rate(breaths/min): 18 Devenport, Eulises ([id number]) [11:Photos:] [phone].pdf Page 4 of 13] Left, Posterior Lower Leg Right, Medial Lower Leg Left, Medial Lower Leg Wound Location: Gradually Appeared Gradually Appeared Gradually Appeared Wounding Event: Lymphedema Lymphedema Lymphedema Primary Etiology: Anemia, Lymphedema, Deep Vein Anemia, Lymphedema, Deep Vein Anemia, Lymphedema, Deep Vein Comorbid History: Thrombosis, Hypertension, Received Thrombosis, Hypertension, Received Thrombosis, Hypertension, Received Radiation Radiation Radiation 10/19/2022 03/05/2022 03/05/2022 Date Acquired: 7 37 37 Weeks of Treatment: Open Open Open Wound Status: No No No Wound Recurrence: No Yes Yes Clustered Wound: N/A 1 1 Clustered Quantity: 3x3.5x0.1 14x27x0.6 2.5x1.5x0.3 Measurements L x W x D (cm) 8.247 296.881 2.945 A (cm) : rea 0.825 178.128 0.884 Volume (cm) : 33.30% -107.70% 93.80% % Reduction in Area: 33.30% -1146.20% 81.20% % Reduction in Volume: Full Thickness Without Exposed Full Thickness Without  Exposed Full Thickness Without Exposed Classification: Support Structures Support Structures Support Structures Medium Medium Medium Exudate Amount: Serosanguineous Serosanguineous Serosanguineous Exudate Type: red, brown red, brown red, brown Exudate Color: N/A Distinct, outline attached Distinct, outline attached Wound Margin: Small (1-33%) None Present (0%) None Present (0%) Granulation Amount: Pink N/A N/A Granulation Quality: Large (67-100%) Large (67-100%) Large (67-100%) Necrotic Amount: Fat Layer (Subcutaneous Tissue): Yes Fat Layer (Subcutaneous Tissue): Yes Fat Layer (Subcutaneous Tissue): Yes Exposed Structures: Fascia: No Fascia: No Fascia: No Tendon: No Tendon: No Tendon: No Muscle: No Muscle: No Muscle: No Joint: No Joint: No Joint: No Bone: No Bone: No Bone: No Medium (34-66%) None None Epithelialization: Excoriation: No Scarring: Yes Scarring: Yes Periwound Skin Texture: Induration: No Excoriation: No Excoriation: No Callus: No Induration: No Induration: No Crepitus: No Callus: No Callus: No Rash: No Crepitus: No Crepitus: No Scarring: No Rash: No Rash: No Dry/Scaly: Yes Dry/Scaly: Yes Maceration: Yes Periwound Skin Moisture: Maceration: No Maceration: No Dry/Scaly: No Atrophie Blanche: No Hemosiderin Staining: Yes Hemosiderin Staining: Yes Periwound Skin Color: Cyanosis: No Atrophie Blanche: No Atrophie Blanche: No Ecchymosis: No Cyanosis: No Cyanosis: No Erythema: No Ecchymosis: No Ecchymosis: No Hemosiderin Staining: No Erythema: No Erythema: No Mottled: No Mottled: No Mottled: No Pallor: No Pallor: No Pallor: No  Rubor: No Rubor: No Rubor: No No Abnormality N/A N/A Temperature: Compression Therapy Compression Therapy Compression Therapy Procedures Performed: Treatment Notes Wound #11 (Lower Leg) Wound Laterality: Left, Posterior Cleanser Soap and Water Discharge Instruction: May shower and wash wound  with dial antibacterial soap and water prior to dressing change. Peri-Wound Care Sween Lotion (Moisturizing lotion) Discharge Instruction: Apply moisturizing lotion as directed Topical Primary Dressing Maxorb Extra Ag+ Alginate Dressing, 4x4.75 (in/in) Discharge Instruction: Apply to wound bed as instructed Secondary Dressing ABD Pad, 5x9 Discharge Instruction: Apply over primary dressing as directed. Lightner, [name] ([id number]) [id number] Page 5 of 13 Woven Gauze Sponge, Non-Sterile 4x4 in Discharge Instruction: Apply over primary dressing as directed. Zetuvit Plus 4x8 in Discharge Instruction: Apply over primary dressing as directed. Secured With Compression Wrap Urgo K2, (equivalent to a 4 layer) two layer compression system, regular Discharge Instruction: Apply Urgo K2 as directed (alternative to 4 layer compression). Compression Stockings Add-Ons Wound #6 (Lower Leg) Wound Laterality: Right, Medial Cleanser Soap and Water Discharge Instruction: May shower and wash wound with dial antibacterial soap and water prior to dressing change. Peri-Wound Care Sween Lotion (Moisturizing lotion) Discharge Instruction: Apply moisturizing lotion as directed Topical Primary Dressing Maxorb Extra Ag+ Alginate Dressing, 4x4.75 (in/in) Discharge Instruction: Apply to wound bed as instructed Secondary Dressing ABD Pad, 5x9 Discharge Instruction: Apply over primary dressing as directed. Woven Gauze Sponge, Non-Sterile 4x4 in Discharge Instruction: Apply over primary dressing as directed. Zetuvit Plus 4x8 in Discharge Instruction: Apply over primary dressing as directed. Secured With Compression Wrap Urgo K2, (equivalent to a 4 layer) two layer compression system, regular Discharge Instruction: Apply Urgo K2 as directed (alternative to 4 layer compression). Compression Stockings Add-Ons Wound #7 (Lower Leg) Wound Laterality: Left, Medial Cleanser Soap and  Water Discharge Instruction: May shower and wash wound with dial antibacterial soap and water prior to dressing change. Peri-Wound Care Sween Lotion (Moisturizing lotion) Discharge Instruction: Apply moisturizing lotion as directed Topical Primary Dressing Maxorb Extra Ag+ Alginate Dressing, 4x4.75 (in/in) Discharge Instruction: Apply to wound bed as instructed Secondary Dressing ABD Pad, 5x9 Discharge Instruction: Apply over primary dressing as directed. Woven Gauze Sponge, Non-Sterile 4x4 in Discharge Instruction: Apply over primary dressing as directed. Zetuvit Plus 4x8 in Discharge Instruction: Apply over primary dressing as directed. Burchfield, [name] ([id number]) [id number] Page 6 of 13 Secured With Compression Wrap Urgo K2, (equivalent to a 4 layer) two layer compression system, regular Discharge Instruction: Apply Urgo K2 as directed (alternative to 4 layer compression). Compression Stockings Add-Ons Electronic Signature(s) Signed: 12/10/2022 4:12:50 PM By: Baltazar Najjar MD Entered By: Baltazar Najjar on 12/10/2022 15:42:35 -------------------------------------------------------------------------------- Multi-Disciplinary Care Plan Details Patient Name: Date of Service: Clenney, [name] 12/10/2022 2:45 PM Medical Record Number: [id number] Patient Account Number: [id number] Date of Birth/Sex: Treating RN: 1946/04/22 (77 y.o. [name] Primary Care Melondy Blanchard: [name] Other Clinician: Referring Jonika Critz: Treating Zarion Oliff/Extender: Valentina Gu Weeks in Treatment: 37 Active Inactive Wound/Skin Impairment Nursing Diagnoses: Impaired tissue integrity Knowledge deficit related to ulceration/compromised skin integrity Goals: Patient will have a decrease in wound volume by X% from date: (specify in notes) Date Initiated: 03/26/2022 Target Resolution Date: 01/17/2023 Goal Status: Active Patient/caregiver will  verbalize understanding of skin care regimen Date Initiated: 03/26/2022 Target Resolution Date: 01/17/2023 Goal Status: Active Ulcer/skin breakdown will have a volume reduction of 30% by week 4 Date Initiated: 03/26/2022 Date Inactivated: 05/21/2022 Target Resolution Date: 05/17/2022 Unmet Reason: see wound Goal Status: Unmet measurement. Ulcer/skin breakdown will have a volume reduction  of 50% by week 8 Date Initiated: 03/26/2022 Date Inactivated: 05/21/2022 Target Resolution Date: 05/17/2022 Unmet Reason: see wound Goal Status: Unmet measurement. Interventions: Assess patient/caregiver ability to obtain necessary supplies Assess patient/caregiver ability to perform ulcer/skin care regimen upon admission and as needed Assess ulceration(s) every visit Notes: Patient stated today, "I will take my fluid pill or pump not do both." Ileana Chalupa made aware. Electronic Signature(s) Signed: 12/10/2022 6:08:49 PM By: [name] RN, [name] Entered By: [name] on 12/10/2022 14:39:33 Novicki, [name] ([id number]) [id number] Page 7 of 13 -------------------------------------------------------------------------------- Pain Assessment Details Patient Name: Date of Service: Common, [name] 12/10/2022 2:45 PM Medical Record Number: [id number] Patient Account Number: [id number] Date of Birth/Sex: Treating RN: [birth year] (77 y.o. M) Primary Care Yamaira Spinner: [name] Other Clinician: Referring Likisha Alles: Treating Henlee Donovan/Extender: Valentina Gu Weeks in Treatment: 37 Active Problems Location of Pain Severity and Description of Pain Patient Has Paino Yes Site Locations Pain Location: Generalized Pain, Pain in Ulcers Rate the pain. Current Pain Level: 7 Pain Management and Medication Current Pain Management: Electronic Signature(s) Signed: 12/11/2022 4:36:58 PM By: Thayer Dallas Entered By: Thayer Dallas on 12/10/2022  14:48:06 -------------------------------------------------------------------------------- Patient/Caregiver Education Details Patient Name: Date of Service: Sadlowski, [name] 7/24/2024andnbsp2:45 PM Medical Record Number: [id number] Patient Account Number: [id number] Date of Birth/Gender: Treating RN: May 07, 1946 (77 y.o. [name] Primary Care Physician: [name] Other Clinician: Referring Physician: Treating Physician/Extender: Stark Klein in Treatment: 37 Education Assessment Education Provided To: Patient Education Topics Provided Wound/Skin Impairment: Handouts: Caring for Your Ulcer Methods: Explain/Verbal Responses: Reinforcements needed Scullion, [name] ([id number]) [id number] Page 8 of 13 Electronic Signature(s) Signed: 12/10/2022 6:08:49 PM By: [name] RN, [name] Entered By: [name] on 12/10/2022 14:40:15 -------------------------------------------------------------------------------- Wound Assessment Details Patient Name: Date of Service: Couey, [name] 12/10/2022 2:45 PM Medical Record Number: [id number] Patient Account Number: [id number] Date of Birth/Sex: Treating RN: 23-Aug-1945 (77 y.o. M) Primary Care Trei Schoch: [name] Other Clinician: Referring Mayuri Staples: Treating Preeti Winegardner/Extender: Valentina Gu Weeks in Treatment: 37 Wound Status Wound Number: 11 Primary Lymphedema Etiology: Wound Location: Left, Posterior Lower Leg Wound Open Wounding Event: Gradually Appeared Status: Date Acquired: 10/19/2022 Comorbid Anemia, Lymphedema, Deep Vein Thrombosis, Hypertension, Weeks Of Treatment: 7 History: Received Radiation Clustered Wound: No Photos Wound Measurements Length: (cm) Width: (cm) Depth: (cm) Area: (cm) Volume: (cm) 3 % Reduction in Area: 33.3% 3.5 % Reduction in Volume: 33.3% 0.1 Epithelialization: Medium (34-66%) 8.247 Tunneling: No 0.825  Undermining: No Wound Description Classification: Full Thickness Without Exposed Su Exudate Amount: Medium Exudate Type: Serosanguineous Exudate Color: red, brown pport Structures Wound Bed Granulation Amount: Small (1-33%) Exposed Structure Granulation Quality: Pink Fascia Exposed: No Necrotic Amount: Large (67-100%) Fat Layer (Subcutaneous Tissue) Exposed: Yes Necrotic Quality: Adherent Slough Tendon Exposed: No Muscle Exposed: No Joint Exposed: No Bone Exposed: No Periwound Skin Texture Texture Color No Abnormalities Noted: Yes No Abnormalities Noted: Yes Moisture Temperature / Pain No Abnormalities Noted: No Temperature: No Abnormality Dry / Scaly: Yes Butz, Idris ([id number]) [id number] Page 9 of 13 Maceration: No Treatment Notes Wound #11 (Lower Leg) Wound Laterality: Left, Posterior Cleanser Soap and Water Discharge Instruction: May shower and wash wound with dial antibacterial soap and water prior to dressing change. Peri-Wound Care Sween Lotion (Moisturizing lotion) Discharge Instruction: Apply moisturizing lotion as directed Topical Primary Dressing Maxorb Extra Ag+ Alginate Dressing, 4x4.75 (in/in) Discharge Instruction: Apply to wound bed as instructed Secondary Dressing ABD Pad, 5x9 Discharge Instruction: Apply over primary  dressing as directed. Woven Gauze Sponge, Non-Sterile 4x4 in Discharge Instruction: Apply over primary dressing as directed. Zetuvit Plus 4x8 in Discharge Instruction: Apply over primary dressing as directed. Secured With Compression Wrap Urgo K2, (equivalent to a 4 layer) two layer compression system, regular Discharge Instruction: Apply Urgo K2 as directed (alternative to 4 layer compression). Compression Stockings Add-Ons Electronic Signature(s) Signed: 12/11/2022 4:36:58 PM By: Thayer Dallas Entered By: Thayer Dallas on 12/10/2022  15:01:59 -------------------------------------------------------------------------------- Wound Assessment Details Patient Name: Date of Service: Correnti, [name] 12/10/2022 2:45 PM Medical Record Number: [id number] Patient Account Number: [id number] Date of Birth/Sex: Treating RN: [birth year] (77 y.o. M) Primary Care Truman Aceituno: [name] Other Clinician: Referring Emslee Lopezmartinez: Treating Manas Hickling/Extender: Valentina Gu Weeks in Treatment: 37 Wound Status Wound Number: 6 Primary Lymphedema Etiology: Wound Location: Right, Medial Lower Leg Wound Open Wounding Event: Gradually Appeared Status: Date Acquired: 03/05/2022 Comorbid Anemia, Lymphedema, Deep Vein Thrombosis, Hypertension, Weeks Of Treatment: 37 History: Received Radiation Clustered Wound: Yes Photos Gemma, [name] ([id number]) [id number] Page 10 of 13 Wound Measurements Length: (cm) Width: (cm) Depth: (cm) Clustered Quantity: Area: (cm) Volume: (cm) 14 % Reduction in Area: -107.7% 27 % Reduction in Volume: -1146.2% 0.6 Epithelialization: None 1 Tunneling: No 296.881 Undermining: No 178.128 Wound Description Classification: Full Thickness Without Exposed Sup Wound Margin: Distinct, outline attached Exudate Amount: Medium Exudate Type: Serosanguineous Exudate Color: red, brown port Structures Foul Odor After Cleansing: No Slough/Fibrino Yes Wound Bed Granulation Amount: None Present (0%) Exposed Structure Necrotic Amount: Large (67-100%) Fascia Exposed: No Necrotic Quality: Adherent Slough Fat Layer (Subcutaneous Tissue) Exposed: Yes Tendon Exposed: No Muscle Exposed: No Joint Exposed: No Bone Exposed: No Periwound Skin Texture Texture Color No Abnormalities Noted: No No Abnormalities Noted: No Callus: No Atrophie Blanche: No Crepitus: No Cyanosis: No Excoriation: No Ecchymosis: No Induration: No Erythema: No Rash: No Hemosiderin Staining:  Yes Scarring: Yes Mottled: No Pallor: No Moisture Rubor: No No Abnormalities Noted: No Dry / Scaly: Yes Maceration: No Treatment Notes Wound #6 (Lower Leg) Wound Laterality: Right, Medial Cleanser Soap and Water Discharge Instruction: May shower and wash wound with dial antibacterial soap and water prior to dressing change. Peri-Wound Care Sween Lotion (Moisturizing lotion) Discharge Instruction: Apply moisturizing lotion as directed Topical Primary Dressing Maxorb Extra Ag+ Alginate Dressing, 4x4.75 (in/in) Discharge Instruction: Apply to wound bed as instructed Secondary Dressing ABD Pad, 5x9 Discharge Instruction: Apply over primary dressing as directed. Woven Gauze Sponge, Non-Sterile 4x4 in Discharge Instruction: Apply over primary dressing as directed. Stopper, [name] ([id number]) [id number] Page 11 of 13 Zetuvit Plus 4x8 in Discharge Instruction: Apply over primary dressing as directed. Secured With Compression Wrap Urgo K2, (equivalent to a 4 layer) two layer compression system, regular Discharge Instruction: Apply Urgo K2 as directed (alternative to 4 layer compression). Compression Stockings Add-Ons Electronic Signature(s) Signed: 12/11/2022 4:36:58 PM By: Thayer Dallas Entered By: Thayer Dallas on 12/10/2022 15:02:18 -------------------------------------------------------------------------------- Wound Assessment Details Patient Name: Date of Service: Pipe, [name] 12/10/2022 2:45 PM Medical Record Number: [id number] Patient Account Number: [id number] Date of Birth/Sex: Treating RN: 1945-07-15 (77 y.o. M) Primary Care Asir Bingley: [name] Other Clinician: Referring Krisanne Lich: Treating Reona Zendejas/Extender: Valentina Gu Weeks in Treatment: 37 Wound Status Wound Number: 7 Primary Lymphedema Etiology: Wound Location: Left, Medial Lower Leg Wound Open Wounding Event: Gradually Appeared Status: Date  Acquired: 03/05/2022 Comorbid Anemia, Lymphedema, Deep Vein Thrombosis, Hypertension, Weeks Of Treatment: 37 History: Received Radiation Clustered Wound: Yes Photos Wound Measurements Length: (cm) Width: (cm) Depth: (  cm) Clustered Quantity: Area: (cm) Volume: (cm) 2.5 % Reduction in Area: 93.8% 1.5 % Reduction in Volume: 81.2% 0.3 Epithelialization: None 1 Tunneling: No 2.945 Undermining: No 0.884 Wound Description Classification: Full Thickness Without Exposed Sup Wound Margin: Distinct, outline attached Exudate Amount: Medium Exudate Type: Serosanguineous Exudate Color: red, brown port Structures Foul Odor After Cleansing: No Slough/Fibrino Yes Wound Bed Granulation Amount: None Present (0%) Exposed Structure Necrotic Amount: Large (67-100%) Fascia Exposed: No Reaves, Santosh ([id number]) [id number] Page 12 of 13 Necrotic Quality: Adherent Slough Fat Layer (Subcutaneous Tissue) Exposed: Yes Tendon Exposed: No Muscle Exposed: No Joint Exposed: No Bone Exposed: No Periwound Skin Texture Texture Color No Abnormalities Noted: No No Abnormalities Noted: No Callus: No Atrophie Blanche: No Crepitus: No Cyanosis: No Excoriation: No Ecchymosis: No Induration: No Erythema: No Rash: No Hemosiderin Staining: Yes Scarring: Yes Mottled: No Pallor: No Moisture Rubor: No No Abnormalities Noted: No Dry / Scaly: No Maceration: Yes Treatment Notes Wound #7 (Lower Leg) Wound Laterality: Left, Medial Cleanser Soap and Water Discharge Instruction: May shower and wash wound with dial antibacterial soap and water prior to dressing change. Peri-Wound Care Sween Lotion (Moisturizing lotion) Discharge Instruction: Apply moisturizing lotion as directed Topical Primary Dressing Maxorb Extra Ag+ Alginate Dressing, 4x4.75 (in/in) Discharge Instruction: Apply to wound bed as instructed Secondary Dressing ABD Pad, 5x9 Discharge Instruction: Apply  over primary dressing as directed. Woven Gauze Sponge, Non-Sterile 4x4 in Discharge Instruction: Apply over primary dressing as directed. Zetuvit Plus 4x8 in Discharge Instruction: Apply over primary dressing as directed. Secured With Compression Wrap Urgo K2, (equivalent to a 4 layer) two layer compression system, regular Discharge Instruction: Apply Urgo K2 as directed (alternative to 4 layer compression). Compression Stockings Add-Ons Electronic Signature(s) Signed: 12/11/2022 4:36:58 PM By: Thayer Dallas Entered By: Thayer Dallas on 12/10/2022 15:02:40 -------------------------------------------------------------------------------- Vitals Details Patient Name: Date of Service: Silverio, [name] 12/10/2022 2:45 PM Medical Record Number: [id number] Patient Account Number: [id number] Date of Birth/Sex: Treating RN: [birth year] (77 y.o. M) Primary Care Traven Davids: [name] Other Clinician: Referring Kenasia Scheller: Treating Renly Guedes/Extender: Valentina Gu Killion, [name] ([id number]) [id number] Page 13 of 13 Weeks in Treatment: 37 Vital Signs Time Taken: 14:39 Temperature (F): 97.7 Height (in): 74 Pulse (bpm): 86 Weight (lbs): 250 Respiratory Rate (breaths/min): 18 Body Mass Index (BMI): 32.1 Blood Pressure (mmHg): 161/61 Reference Range: 80 - 120 mg / dl Electronic Signature(s) Signed: 12/11/2022 4:36:58 PM By: Thayer Dallas Entered By: Thayer Dallas on 12/10/2022 14:47:20

## 2022-12-11 NOTE — Progress Notes (Signed)
Mudrick, [name] ([id number]) [phone].pdf Page 1 of 11 Visit Report for 12/10/2022 HPI Details Patient Name: Date of Service: Walmsley, [name] 12/10/2022 2:45 PM Medical Record Number: [id number] Patient Account Number: [id number] Date of Birth/Sex: Treating RN: [birth year] (77 y.o. M) Primary Care Provider: [name] Other Clinician: Referring Provider: Treating Provider/Extender: Valentina Gu Weeks in Treatment: 37 History of Present Illness HPI Description: ADMISSION 02/23/2020 Patient is a 77 year old man who lives in [place] who arrives accompanied by his wife. He has a history of chronic lymphedema and venous insufficiency in his bilateral lower legs which may have something to do that with having a history of DVT as well as being treated for prostate cancer. In any case he recently got compression pumps at home but compliance has been an issue here. He has compression stockings however they are probably not sufficient enough to control swelling. They tell us that things deteriorated for him in late August he was admitted to [facility] for 7 days. This was with cellulitis I think of his bilateral lower legs. Discharge he was noted to have wounds on his bilateral lower legs. He was discharged on Bactrim. They tried to get him home health through [facility] of course they declined him. His wife is been wrapping these applying some form of silver foam dressing. He has a history of wounds before although nothing that would not heal with basic home topical dressings. He has 2 areas on the left medial, left anterior and left lateral and a smaller area on the right medial. All of these have considerable depth. Past medical history includes iron deficiency anemia, lymphedema followed by the rehab center at [facility] with lymphedema wraps I believe, DVT on chronic anticoagulation, prostate cancer, chronic venous  insufficiency, hypertension. As mentioned he has compression pumps but does not use them. ABIs in our clinic were noncompressible bilaterally 10/14; patient with severe bilateral lymphedema right greater than left. He came in with bilateral lower extremity wounds left greater than right. Even though the right side has more of the edema most of the wounds here almost closed on the right medial. He has 3 remaining wounds on the left We have been using silver alginate under 4-layer compression I have been trying to get him to be compliant with his external compression pumps 10/21; patient with 3 small wounds on the left leg and 1 on the right medial in the setting of severe lymphedema and chronic venous insufficiency. We have been using silver alginate under 4-layer compression he is using his external compression pumps twice a day 11/4; ARTERIAL STUDIES on the right show an ABI of 1.02 TBI of 0.858 with biphasic waveforms on the left 0.98 with a TBI of 0.55 and biphasic waveforms. Does not look like he has significant arterial disease. We are treating him for lymphedema he has compression pumps. He has punched-out areas on the left anterior left lateral and right medial lower extremities 11/11; after we obtained his arterial studies I put him in 4 layer compression. He is using his compression pumps probably once a day although I have asked him to do twice. Primary dressing to the wound is silver collagen he has severe lymphedema likely secondary to chronic venous insufficiency. Wounds on the left lateral, left medial and left anterior and a small area on the right medial 12/2; the area on the right anterior lower leg has healed. We initially thought that the area medially had healed as  well however when her discharge nurse came in she detected fluid in the wound simply opened up. This is actually worse than I remember this pain. The area on the left lateral potentially slightly smaller He is also  complaining about pain in his left hand he says that this is actually been getting some better he has been using topical creams on this. She asked that I look at this 12/9 after last weeks issues we have 2 wounds one on the right medial lower leg and 1 on the left lateral. Both of these are in the same condition. I think because of thickened skin secondary to chronic lymphedema these wounds actually have depth of almost 0.8 cm. 12/16; the patient has 2 small but deep wounds one on the right medial and one on the left lateral. The right medial is actually the worst of these. He arrives in clinic today with absolutely terrible edema in the right leg apparently his 4-layer wrap fell down to just above his ankle he did not think about this he is apparently been continuing to use his compression pump twice a day. The left leg looks a lot better. 05/09/2020 upon evaluation today patient appears to be doing decently well in regard to his wounds. Everything is measuring smaller the right leg still has a little bit deeper wound in the left seems to be almost completely healed in my opinion I am very pleased in general with how things are progressing. He has a 4- layer compression wrap we have been using endoform today we will probably have to use collagen just based on the fact that we do not have endoform it is on order. 1/6; the patient's wound on the left lateral lower leg has healed. Still has 1 on the right medial. 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 Using compression pumps at home twice a day. 1/13; left lateral lower leg is still healed. He has a deep punched out rectangular shaped wound on the right medial calf. Looking down at this it appears that he is attempting to epithelialize around the edges of the wound and on the base as well. His edema is reasonably well controlled we have been using collagen with absolutely no effect 1/20; left lateral lower leg remains closed he has  extremitease stockings. The area on the right medial calf I aggressively debrided last week measures larger but the surface looks better. We have been using Hydrofera Blue. We ran Oasis through his insurance but we have not seen the results of this 1/27; left lower leg wound with chronic venous insufficiency and secondary lymphedema. I did aggressive debridement on this last week the wound seems to have come in healthy looking surface using Hydrofera Blue. He was denied for Oasis 2/3; small divot in the right medial lower leg. Under illumination the walls of this divot are epithelialized however the base has slough which I removed with a curette we have been using Hydrofera Blue 2/10 small divot on the right medial lower leg pinpoint illumination at the base of this cone-shaped wound. We have been using Hydrofera Blue but I will switch to calcium alginate this week 2/17; the small divot on the right medial lower leg is fully epithelialized. There is no visible open area under illumination. He has his own stocking for the right leg similar to the one he has been wearing on the left. Rottmann, [name] ([id number]) [phone].pdf Page 2 of 11 03/26/2022; READMISSION This is a now 77 year old man that  we had in the clinic from 02/23/2020 through 07/05/2020. At that point he had bilateral lower extremity wounds left greater than right in the setting of severe lymphedema. He had already obtained compression pumps ordered for him I think from the wound care clinic in [facility] so I do not really have record of what he has been using. He claims to be using them once a day but there is a problem with the sleeve on the left leg. About 2 weeks ago he was hospitalized from 03/11/2022 through 03/14/2022 with diastolic congestive heart failure. 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 He was diuresed. Developed some prerenal azotemia  and he has not been taking any diuretics currently. He has not been putting stockings on his legs since he got out of hospital and still has his legs dependent for long periods. Past medical history history of prostate cancer treated with prostatectomy and radiation this was apparently about 8 years ago, history of DVT on chronic Coumadin, history of lymphedema was managed for a while at the clinic in [place]. History of inguinal hernia repair in September 22, hypertension, stage IIIb chronic renal failure ABIs today were noncompressible on the right 1.12 on the left 04-02-2022 upon evaluation today patient appears to be doing well currently in regard to his legs I do feel like both areas that are draining are actually much drier than they were in the picture last week although the left is drier than the right. He is tolerating the 4-layer compression wraps at this point he did contact the pump company and they are actually working on getting him a new compression sleeve for one of his legs which have previously popped and was not functioning properly. 04-23-2022 upon evaluation today patient appears to be doing well currently in regard to his wounds on the legs. I am actually very pleased with where things stand and I do feel like that we are headed in the right direction. Fortunately there is no sign of active infection locally or systemically at this time. 05-07-2022 upon evaluation today patient appears to be doing well currently in regard to his wounds in fact things are showing signs of improvement which is good news I do not see too much that actually appears to be open and I am very pleased in that regard. No fevers, chills, nausea, vomiting, or diarrhea. 05-21-2022 upon evaluation today patient appears to be doing somewhat poorly in regard to drainage of his lower extremities bilaterally. The right is greater than left as far as the weeping area. Nonetheless it seems to be getting worse not  better. He actually has pitting edema which is at least 2+ to the thighs and I am concerned about the fact that he is may be fluid overloaded in general and that is the reason why we cannot get this under control. I know he is not using his pumps all the time because he actually told the nurse that he was either going to pump or he was going to use his fluid pills but not do both. For that reason I do think that he needs to be really doing both in order to get the fluid out as effectively as possible obviously with the 4-layer compression wraps were doing as much as we can from a compression standpoint but it is really not enough. He tells me that he elevates his leg is much as he can in between pumping and other  activity throughout the day. 05-28-2022 upon evaluation today patient appears to be doing better in regard to his wounds although the measurements may be a little bit larger this is a very difficult wound to heal it is very indistinct in a lot of areas. Nonetheless there is can be some need for sharp debridement in regard to both medial and lateral legs. Fortunately I see no signs of active infection locally nor systemically at this time. No fevers, chills, nausea, vomiting, or diarrhea. 06-04-2022 upon evaluation today patient appears to be doing poorly in general in regard to the wounds on his legs. He still continues to have a tremendous amount of fluid not just in the lower portion of his leg but to be honest his thigh where he has 2-3+ pitting edema in the thigh as well. Unfortunately I do not know that we will be able to get this healed effectively and keep it healed on the lower extremities unless he gets the overall fluid situation taking and under control. Fortunately I do not see any signs of infection locally nor systemically which is great news. He just seems to be very fluid overloaded. 06-11-2022 upon evaluation today patient presents for follow-up concerning his bilateral lower  extremity lymphedema secondary to chronic venous insufficiency. He has been tolerating the dressing changes with the compression wraps without complication. Fortunately I do not see any evidence of infection at this time which is great news. No fevers, chills, nausea, vomiting, or diarrhea. 06-18-2022 upon evaluation today patient appears to be doing well currently in regard to his wounds as far as not looking like they are terribly infected but nonetheless I am concerned about a subacute infection secondary to the fact that he continues to have spreading despite the compression therapy. We actually did do an Unna boot on him last week this is actually the first wrap that actually stayed up everything else has been sliding down quite significantly. Fortunately there does not appear to be any signs of infection systemically at this time. With that being said I do believe that locally there seems to be an issue going on here and again I Ernie Hew do a PCR culture to see what that shows also think that I am going to put him on a broad-spectrum antibiotic, doxycycline to see how that will help as well. He does tell me that coming into the clinic today that he was feeling short of breath like "he was about to have a heart attack" because he was having such a hard time breathing. He says that he told this to Dr. Jodelle Green his cardiologist as well when he was evaluated in the past 1 to 2 weeks. 07-02-2022 upon evaluation today patient appears to be doing poorly currently in regard to his wound. He has been tolerating the dressing changes. Unfortunately he has not had any compression wraps on for the past week because he was unable to make it in for his appointment last week. With that being said he has a significant amount of drainage he tells me has been using his pumps but despite this in the pumps he still has been draining quite a bit. The drainage is also somewhat purulent unfortunately. We did attempt to get in  touch with his cardiologist last week unfortunately we were unable to get up with him I did advise that the patient needs to get in touch with him upon leaving today in order to make sure they know he is on the new antibiotics I  am going to send him this will be Levaquin and Augmentin. 07-09-2022 upon evaluation today patient appears to be doing about the same in regard to his legs he may have just a slight amount of improvement with regard to the drainage probably Keystone topical antibiotics are helping in this regard to some degree. Fortunately there does not appear to be any signs of active infection systemically which is great news. No fevers, chills, nausea, vomiting, or diarrhea. 07-16-2022 upon evaluation today patient appears to be doing well currently in regard to his wound. He has been tolerating the dressing changes without complication. Fortunately there does not appear to be any signs of active infection locally nor systemically at this time. With that being said he cannot keep the wraps up he tells me on the left side he had to cut this down because it got too tight. He has been using his pumps but he is on the right side the wrap actually straight down causing some pushing around the central part of his leg just below the calf I think this is a bigger risk for him that help at this point. I think that we may need to try something different he should be getting his compression socks shortly he tells me they were ordered last Thursday. 3/6; ; this is a patient who lives in [place]. He has severe bilateral lymphedema. He has compression pumps, we have been using kerlix Ace wrap Keystone. He is changing the dressing. We do not have home health. 08-06-2022 upon evaluation today patient appears to be doing a little better in regard to his wounds in general at this point. Fortunately there does not appear to be any signs of active infection locally nor systemically at this time which is great  news and overall I am extremely pleased with where we stand today. 08-13-2022 upon evaluation today patient appears to actually be doing significantly better compared to last week. He actually did go to the hospital I told him that he needed to when he left here and he actually did go. With that being said they actually ended up admitting him he was having shortness of breath and I thought it might be related to congestive heart failure turns out he actually had a pulmonary embolism. Subsequently they were able to get him off of the Coumadin switching over to Eliquis to get things stabilized in that regard they also had them wrapped and got his swelling under control on his legs he actually looks much better pretty much across the board at this point. I am very pleased in that regard. With that being said I am very happy that he finally went that could have been a very dangerous situation. 08-20-2022 upon evaluation today patient appears to be doing well currently in regard to his wound. Has been tolerating the dressing changes without complication. Fortunately there does not appear to be any signs of active infection locally nor systemically at this time. I think his legs are doing better there is some need for sharp debridement today. Mask, [name] ([id number]) [phone].pdf Page 3 of 11 08-27-2022 upon evaluation patient is actually making excellent progress. I am actually very pleased with where he stands and I think that he is moving in the right direction. In general I think that we are looking pretty good at the moment. 09-03-2022 upon evaluation today patient appears to be doing well currently in regard to his wound. He is actually tolerating dressing changes on the left and right leg  without complication. Fortunately I do not see any need for debridement of the left leg the right leg I think we probably do some need to perform some debridement here. 09-10-2022 upon  evaluation today patient's wounds actually showed signs of improvement in both legs I do not see much is going require debridement today which was great news. Fortunately I do not see any evidence of infection which I think is also excellent he seems to be using his pumps and doing everything right I am happy about how this is progressing at this point. 09-17-2022 upon evaluation today patient appears to be doing decently well in regard to his wounds. He has been tolerating the dressing changes without complication. Fortunately there does not appear to be any signs of active infection at this time which is good news. 09-24-2022 upon evaluation today patient appears to be doing well currently in regard to his wounds. He has been tolerating the dressing changes without complication. Fortunately there does not appear to be any signs of active infection locally nor systemically which is great news. No fevers, chills, nausea, vomiting, or diarrhea. 10-08-2022 upon evaluation today patient appears to be doing excellent currently in regard to his wound. He has been tolerating the dressing changes without complication though it does not sound like he has been using his compression wraps for a bit here. He does think he was doing better with the [facility] topical antibiotics we can definitely go back to using that but I think the biggest issue here is that his swelling is just very out of control and needs to be under control. I discussed that with him today. 10-15-2022 upon evaluation today patient appears to be doing well currently in regard to his wounds. He is actually making some progress which is good news. Fortunately I do not see any evidence of active infection locally nor systemically which is great news as well. No fevers, chills, nausea, vomiting, or diarrhea. He does have a callused area on the plantar aspect of his left foot which is actually causing some pain and he wonders if I can trim this down for  him. 10-22-2022 upon evaluation today patient seems to be making progress. He is actually doing quite well and very pleased in that regard. I do not see any signs of active infection at this time. 11-05-2022 upon evaluation patient appears to be making progress although slowly towards closure. He seems to be doing well with regard to his legs were still using the [facility] topical antibiotics and he seems to be doing quite well. He is going require some sharp debridement today. 11-19-2022 upon evaluation today patient unfortunately appears to be extremely swollen at this point. He tells me that he ran out of supplies he also tells me his leg started leaking more because of not having supplies he was unable to wear his compression wraps that is the juxta fit compression wraps. Therefore his legs are extremely swollen much larger than normal and do not appear to be doing well at all today. He is going require some debridement I also think he is going require [place] to perform compression wrapping today. 11-26-2022 upon evaluation today patient appears to be doing well currently in regard to his legs as far as infection is concerned I see nothing that appears to be infected. Fortunately I do not see any signs of active infection systemically either which is also good news. With that being said he still is extremely swollen as far as his legs  are concerned. I do not see any signs of overall worsening but also do not see any signs of significant improvement which is the major issue here. 12-03-2022 upon evaluation today patient appears to be doing poorly still in regard to his legs the apparently has been taken off the wraps and not leaving the week by week. With that being said he has not had really the dressings to put on the release that is running out and subsequently though he is using his wraps I am not sure that he has been keeping them on like he needs to. I explained to him I really wanted to have the wraps  that I put on him left on until he comes back to see me so that we can keep the compression under better control. He voiced understanding today he tells me that he was "confused about that". Nonetheless based on what I am seeing right now also think that he is up on his feet too much I think he needs to get the feet elevated. 7/24; this is a patient with severe bilateral stage III lymphedema. On the right lower leg medially is a large area of macerated denuded skin was too small deeper areas in this. On the left leg he is 2 areas that are a little more standard in terms of wounds. Massive lymphedema bilaterally. He has compression pumps at home which he uses for 1 hour twice a day Electronic Signature(s) Signed: 12/10/2022 4:12:50 PM By: Baltazar Najjar MD Entered By: Baltazar Najjar on 12/10/2022 15:43:33 -------------------------------------------------------------------------------- Physical Exam Details Patient Name: Date of Service: Rueda, [name] 12/10/2022 2:45 PM Medical Record Number: [id number] Patient Account Number: [id number] Date of Birth/Sex: Treating RN: 11-Sep-1945 (76 y.o. M) Primary Care Provider: [name] Other Clinician: Referring Provider: Treating Provider/Extender: Valentina Gu Weeks in Treatment: 37 Constitutional Patient is hypertensive.. Pulse regular and within target range for patient.Marland Kitchen Respirations regular, non-labored and within target range.. Temperature is normal and within the target range for the patient.Marland Kitchen Appears in no distress. Cardiovascular His dorsalis pedis pulses are palpable bilaterally. Because of the edema his posterior tibial pulses are difficult to feel his foot is warm right down to his toes I do not think there is a significant arterial issue here although at the bedside this would be hard to absolutely be certain about.Marland Kitchen Haeberle, [name] ([id number]) [phone].pdf Page 4 of 11 Notes Wound  exam; bilateral lower extremities severe stage III lymphedema with skin changes secondary to this. The area on the right lower anterior leg is a large denuded macerated area. Vigorous debridement with Vashe and gauze by myself and the intake nurse to get to a somewhat cleaner surface. There is 2 deeper areas in this. Nothing however probes deeply On the left smaller open areas x 2 Electronic Signature(s) Signed: 12/10/2022 4:12:50 PM By: Baltazar Najjar MD Entered By: Baltazar Najjar on 12/10/2022 15:45:33 -------------------------------------------------------------------------------- Physician Orders Details Patient Name: Date of Service: Spooner, [name] 12/10/2022 2:45 PM Medical Record Number: [id number] Patient Account Number: [id number] Date of Birth/Sex: Treating RN: [birth year] (77 y.o. [name] Primary Care Provider: [name] Other Clinician: Referring Provider: Treating Provider/Extender: Valentina Gu Weeks in Treatment: 87 Verbal / Phone Orders: No Diagnosis Coding ICD-10 Coding Code Description [phone] Chronic venous hypertension (idiopathic) with ulcer and inflammation of bilateral lower extremity I89.0 Lymphedema, not elsewhere classified 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.818 Non-pressure chronic  ulcer of other part of right lower leg with other specified severity L84 Corns and callosities Follow-up Appointments ppointment in 1 week. [name] PA-C Wednesday Return A 12/17/22 at 2pm (already has an appt) Other: - Please ask your Primary Care Practioner or Foot Doctor to order orthotic shoes. Anesthetic (In clinic) Topical Lidocaine 5% applied to wound bed Bathing/ Shower/ Hygiene May shower and wash wound with soap and water. - apply clean dressing after bathing. Do not soak legs in tub, may rinse in shower Edema Control - Lymphedema / SCD / Other Lymphedema Pumps.  Use Lymphedema pumps on leg(s) 2-3 times a day for 45-60 minutes. If wearing any wraps or hose, do not remove them. Continue exercising as instructed. - *****pump 3 times a day for an hour each time.***** Elevate legs to the level of the heart or above for 30 minutes daily and/or when sitting for 3-4 times a day throughout the day. - +++ Important+++ ELEVATE THE LEGS>>>>>>>>>> Avoid standing for long periods of time. Exercise regularly Compression stocking or Garment 30-40 mm/Hg pressure to: - Juxtafit Essentials Long for bilateral lower legs; quantity three juxtafit essentials per limb. Other Edema Control Orders/Instructions: - When sitting please keep your feet up, keep legs at Heart Level or above. Use pillows behind calf to for comfort. Off-Loading Other: - When sitting please keep your feet up, keep legs at Heart Level or above. Use pillows behind calf to for comfort. Additional Orders / Instructions Follow Nutritious Diet - Try and increase protein intake. Goal of protein intake is 60g-100g. Wound Treatment Wound #11 - Lower Leg Wound Laterality: Left, Posterior Cleanser: Soap and Water 1 x Per Week/30 Days Discharge Instructions: May shower and wash wound with dial antibacterial soap and water prior to dressing change. Peri-Wound Care: Sween Lotion (Moisturizing lotion) 1 x Per Week/30 Days Discharge Instructions: Apply moisturizing lotion as directed Prim Dressing: Maxorb Extra Ag+ Alginate Dressing, 4x4.75 (in/in) ary 1 x Per Week/30 Days Espy, Deandrea ([id number]) [id number] Page 5 of 11 Discharge Instructions: Apply to wound bed as instructed Secondary Dressing: ABD Pad, 5x9 1 x Per Week/30 Days Discharge Instructions: Apply over primary dressing as directed. Secondary Dressing: Woven Gauze Sponge, Non-Sterile 4x4 in 1 x Per Week/30 Days Discharge Instructions: Apply over primary dressing as directed. Secondary Dressing: Zetuvit Plus 4x8 in 1 x  Per Week/30 Days Discharge Instructions: Apply over primary dressing as directed. Compression Wrap: Urgo K2, (equivalent to a 4 layer) two layer compression system, regular 1 x Per Week/30 Days Discharge Instructions: Apply Urgo K2 as directed (alternative to 4 layer compression). Wound #6 - Lower Leg Wound Laterality: Right, Medial Cleanser: Soap and Water 1 x Per Week/30 Days Discharge Instructions: May shower and wash wound with dial antibacterial soap and water prior to dressing change. Peri-Wound Care: Sween Lotion (Moisturizing lotion) 1 x Per Week/30 Days Discharge Instructions: Apply moisturizing lotion as directed Prim Dressing: Maxorb Extra Ag+ Alginate Dressing, 4x4.75 (in/in) 1 x Per Week/30 Days ary Discharge Instructions: Apply to wound bed as instructed Secondary Dressing: ABD Pad, 5x9 1 x Per Week/30 Days Discharge Instructions: Apply over primary dressing as directed. Secondary Dressing: Woven Gauze Sponge, Non-Sterile 4x4 in 1 x Per Week/30 Days Discharge Instructions: Apply over primary dressing as directed. Secondary Dressing: Zetuvit Plus 4x8 in 1 x Per Week/30 Days Discharge Instructions: Apply over primary dressing as directed. Compression Wrap: Urgo K2, (equivalent to a 4 layer) two layer compression system, regular 1 x Per Week/30 Days Discharge Instructions: Apply  Urgo K2 as directed (alternative to 4 layer compression). Wound #7 - Lower Leg Wound Laterality: Left, Medial Cleanser: Soap and Water 1 x Per Week/30 Days Discharge Instructions: May shower and wash wound with dial antibacterial soap and water prior to dressing change. Peri-Wound Care: Sween Lotion (Moisturizing lotion) 1 x Per Week/30 Days Discharge Instructions: Apply moisturizing lotion as directed Prim Dressing: Maxorb Extra Ag+ Alginate Dressing, 4x4.75 (in/in) 1 x Per Week/30 Days ary Discharge Instructions: Apply to wound bed as instructed Secondary Dressing: ABD Pad, 5x9 1 x Per Week/30  Days Discharge Instructions: Apply over primary dressing as directed. Secondary Dressing: Woven Gauze Sponge, Non-Sterile 4x4 in 1 x Per Week/30 Days Discharge Instructions: Apply over primary dressing as directed. Secondary Dressing: Zetuvit Plus 4x8 in 1 x Per Week/30 Days Discharge Instructions: Apply over primary dressing as directed. Compression Wrap: Urgo K2, (equivalent to a 4 layer) two layer compression system, regular 1 x Per Week/30 Days Discharge Instructions: Apply Urgo K2 as directed (alternative to 4 layer compression). Electronic Signature(s) Signed: 12/10/2022 4:12:50 PM By: Baltazar Najjar MD Signed: 12/10/2022 6:08:49 PM By: [name] RN, [name] Entered By: [name] on 12/10/2022 15:16:13 -------------------------------------------------------------------------------- Problem List Details Patient Name: Date of Service: [place], [name] 12/10/2022 2:45 PM Kirsten, [name] ([id number]) [id number] Page 6 of 11 Medical Record Number: [id number] Patient Account Number: [id number] Date of Birth/Sex: Treating RN: 06-16-1945 (77 y.o. [name] Primary Care Provider: [name] Other Clinician: Referring Provider: Treating Provider/Extender: Valentina Gu Weeks in Treatment: 37 Active Problems ICD-10 Encounter Code Description Active Date MDM Diagnosis I87.333 Chronic venous hypertension (idiopathic) with ulcer and inflammation of 06/11/2022 No Yes bilateral lower extremity I89.0 Lymphedema, not elsewhere classified 03/26/2022 No Yes L97.828 Non-pressure chronic ulcer of other part of left lower leg with other specified 03/26/2022 No Yes severity 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 other part of right lower leg with other specified 03/26/2022 No Yes severity L84 Corns and callosities 10/15/2022 No Yes Inactive Problems Resolved Problems Electronic Signature(s) Signed: 12/10/2022 4:12:50 PM By: Baltazar Najjar  MD Entered By: Baltazar Najjar on 12/10/2022 15:42:15 -------------------------------------------------------------------------------- Progress Note Details Patient Name: Date of Service: [place], [name] 12/10/2022 2:45 PM Medical Record Number: [id number] Patient Account Number: [id number] Date of Birth/Sex: Treating RN: 03-22-1946 (77 y.o. M) Primary Care Provider: [name] Other Clinician: Referring Provider: Treating Provider/Extender: Valentina Gu Weeks in Treatment: 37 Subjective History of Present Illness (HPI) ADMISSION 02/23/2020 Patient is a 77 year old man who lives in [place] who arrives accompanied by his wife. He has a history of chronic lymphedema and venous insufficiency in his bilateral lower legs which may have something to do that with having a history of DVT as well as being treated for prostate cancer. In any case he recently got compression pumps at home but compliance has been an issue here. He has compression stockings however they are probably not sufficient enough to control swelling. They tell us that things deteriorated for him in late August he was admitted to [facility] for 7 days. This was with cellulitis I think of his bilateral lower legs. Discharge he was noted to have wounds on his bilateral lower legs. He was discharged on Bactrim. They tried to get him home health through [facility] of course they declined him. His wife is been wrapping these applying some form of silver foam dressing. He has a history of wounds before although nothing that would not heal with  basic home topical dressings. He has 2 areas on the left medial, left anterior and left lateral and a smaller area on the right medial. All of these have considerable depth. Koenen, [name] ([id number]) [phone].pdf Page 7 of 11 Past medical history includes iron deficiency anemia, lymphedema followed by the rehab center  at [facility] with lymphedema wraps I believe, DVT on chronic anticoagulation, prostate cancer, chronic venous insufficiency, hypertension. As mentioned he has compression pumps but does not use them. ABIs in our clinic were noncompressible bilaterally 10/14; patient with severe bilateral lymphedema right greater than left. He came in with bilateral lower extremity wounds left greater than right. Even though the right side has more of the edema most of the wounds here almost closed on the right medial. He has 3 remaining wounds on the left We have been using silver alginate under 4-layer compression I have been trying to get him to be compliant with his external compression pumps 10/21; patient with 3 small wounds on the left leg and 1 on the right medial in the setting of severe lymphedema and chronic venous insufficiency. We have been using silver alginate under 4-layer compression he is using his external compression pumps twice a day 11/4; ARTERIAL STUDIES on the right show an ABI of 1.02 TBI of 0.858 with biphasic waveforms on the left 0.98 with a TBI of 0.55 and biphasic waveforms. Does not look like he has significant arterial disease. We are treating him for lymphedema he has compression pumps. He has punched-out areas on the left anterior left lateral and right medial lower extremities 11/11; after we obtained his arterial studies I put him in 4 layer compression. He is using his compression pumps probably once a day although I have asked him to do twice. Primary dressing to the wound is silver collagen he has severe lymphedema likely secondary to chronic venous insufficiency. Wounds on the left lateral, left medial and left anterior and a small area on the right medial 12/2; the area on the right anterior lower leg has healed. We initially thought that the area medially had healed as well however when her discharge nurse came in she detected fluid in the wound simply opened up. This is  actually worse than I remember this pain. The area on the left lateral potentially slightly smaller He is also complaining about pain in his left hand he says that this is actually been getting some better he has been using topical creams on this. She asked that I look at this 12/9 after last weeks issues we have 2 wounds one on the right medial lower leg and 1 on the left lateral. Both of these are in the same condition. I think because of thickened skin secondary to chronic lymphedema these wounds actually have depth of almost 0.8 cm. 12/16; the patient has 2 small but deep wounds one on the right medial and one on the left lateral. The right medial is actually the worst of these. He arrives in clinic today with absolutely terrible edema in the right leg apparently his 4-layer wrap fell down to just above his ankle he did not think about this he is apparently been continuing to use his compression pump twice a day. The left leg looks a lot better. 05/09/2020 upon evaluation today patient appears to be doing decently well in regard to his wounds. Everything is measuring smaller the right leg still has a little bit deeper wound in the left seems to be almost completely healed in  my opinion I am very pleased in general with how things are progressing. He has a 4- layer compression wrap we have been using endoform today we will probably have to use collagen just based on the fact that we do not have endoform it is on order. 1/6; the patient's wound on the left lateral lower leg has healed. Still has 1 on the right medial. 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 Using compression pumps at home twice a day. 1/13; left lateral lower leg is still healed. He has a deep punched out rectangular shaped wound on the right medial calf. Looking down at this it appears that he is attempting to epithelialize around the edges of the wound and on the base as well. His edema is reasonably well  controlled we have been using collagen with absolutely no effect 1/20; left lateral lower leg remains closed he has extremitease stockings. The area on the right medial calf I aggressively debrided last week measures larger but the surface looks better. We have been using Hydrofera Blue. We ran Oasis through his insurance but we have not seen the results of this 1/27; left lower leg wound with chronic venous insufficiency and secondary lymphedema. I did aggressive debridement on this last week the wound seems to have come in healthy looking surface using Hydrofera Blue. He was denied for Oasis 2/3; small divot in the right medial lower leg. Under illumination the walls of this divot are epithelialized however the base has slough which I removed with a curette we have been using Hydrofera Blue 2/10 small divot on the right medial lower leg pinpoint illumination at the base of this cone-shaped wound. We have been using Hydrofera Blue but I will switch to calcium alginate this week 2/17; the small divot on the right medial lower leg is fully epithelialized. There is no visible open area under illumination. He has his own stocking for the right leg similar to the one he has been wearing on the left. 03/26/2022; READMISSION This is a now 77 year old man that we had in the clinic from 02/23/2020 through 07/05/2020. At that point he had bilateral lower extremity wounds left greater than right in the setting of severe lymphedema. He had already obtained compression pumps ordered for him I think from the wound care clinic in [facility] so I do not really have record of what he has been using. He claims to be using them once a day but there is a problem with the sleeve on the left leg. About 2 weeks ago he was hospitalized from 03/11/2022 through 03/14/2022 with diastolic congestive heart failure. 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 He was diuresed.  Developed some prerenal azotemia and he has not been taking any diuretics currently. He has not been putting stockings on his legs since he got out of hospital and still has his legs dependent for long periods. Past medical history history of prostate cancer treated with prostatectomy and radiation this was apparently about 8 years ago, history of DVT on chronic Coumadin, history of lymphedema was managed for a while at the clinic in [place]. History of inguinal hernia repair in September 22, hypertension, stage IIIb chronic renal failure ABIs today were noncompressible on the right 1.12 on the left 04-02-2022 upon evaluation today patient appears to be doing well currently in regard to his legs I do feel like both areas that are draining are actually much drier than  they were in the picture last week although the left is drier than the right. He is tolerating the 4-layer compression wraps at this point he did contact the pump company and they are actually working on getting him a new compression sleeve for one of his legs which have previously popped and was not functioning properly. 04-23-2022 upon evaluation today patient appears to be doing well currently in regard to his wounds on the legs. I am actually very pleased with where things stand and I do feel like that we are headed in the right direction. Fortunately there is no sign of active infection locally or systemically at this time. 05-07-2022 upon evaluation today patient appears to be doing well currently in regard to his wounds in fact things are showing signs of improvement which is good news I do not see too much that actually appears to be open and I am very pleased in that regard. No fevers, chills, nausea, vomiting, or diarrhea. 05-21-2022 upon evaluation today patient appears to be doing somewhat poorly in regard to drainage of his lower extremities bilaterally. The right is greater than left as far as the weeping area. Nonetheless  it seems to be getting worse not better. He actually has pitting edema which is at least 2+ to the thighs and I am concerned about the fact that he is may be fluid overloaded in general and that is the reason why we cannot get this under control. I know he is not using his pumps all the time because he actually told the nurse that he was either going to pump or he was going to use his fluid pills but not do both. For that reason I do think that he needs to be really doing both in order to get the fluid out as effectively as possible obviously with the 4-layer compression wraps were doing as much as we can from a compression standpoint but it is really not enough. He tells me that he elevates his leg is much as he can in between pumping and other activity throughout the day. 05-28-2022 upon evaluation today patient appears to be doing better in regard to his wounds although the measurements may be a little bit larger this is a very difficult wound to heal it is very indistinct in a lot of areas. Nonetheless there is can be some need for sharp debridement in regard to both medial and lateral legs. Fortunately I see no signs of active infection locally nor systemically at this time. No fevers, chills, nausea, vomiting, or diarrhea. Cuello, [name] ([id number]) [id number].pdf Page 8 of 11 06-04-2022 upon evaluation today patient appears to be doing poorly in general in regard to the wounds on his legs. He still continues to have a tremendous amount of fluid not just in the lower portion of his leg but to be honest his thigh where he has 2-3+ pitting edema in the thigh as well. Unfortunately I do not know that we will be able to get this healed effectively and keep it healed on the lower extremities unless he gets the overall fluid situation taking and under control. Fortunately I do not see any signs of infection locally nor systemically which is great news. He just seems to be very  fluid overloaded. 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 He has been tolerating the dressing changes with the compression wraps without complication. Fortunately I do not see any evidence of  infection at this time which is great news. No fevers, chills, nausea, vomiting, or diarrhea. 06-18-2022 upon evaluation today patient appears to be doing well currently in regard to his wounds as far as not looking like they are terribly infected but nonetheless I am concerned about a subacute infection secondary to the fact that he continues to have spreading despite the compression therapy. We actually did do an Unna boot on him last week this is actually the first wrap that actually stayed up everything else has been sliding down quite significantly. Fortunately there does not appear to be any signs of infection systemically at this time. With that being said I do believe that locally there seems to be an issue going on here and again I Ernie Hew do a PCR culture to see what that shows also think that I am going to put him on a broad-spectrum antibiotic, doxycycline to see how that will help as well. He does tell me that coming into the clinic today that he was feeling short of breath like "he was about to have a heart attack" because he was having such a hard time breathing. He says that he told this to Dr. Jodelle Green his cardiologist as well when he was evaluated in the past 1 to 2 weeks. 07-02-2022 upon evaluation today patient appears to be doing poorly currently in regard to his wound. He has been tolerating the dressing changes. Unfortunately he has not had any compression wraps on for the past week because he was unable to make it in for his appointment last week. With that being said he has a significant amount of drainage he tells me has been using his pumps but despite this in the pumps he  still has been draining quite a bit. The drainage is also somewhat purulent unfortunately. We did attempt to get in touch with his cardiologist last week unfortunately we were unable to get up with him I did advise that the patient needs to get in touch with him upon leaving today in order to make sure they know he is on the new antibiotics I am going to send him this will be Levaquin and Augmentin. 07-09-2022 upon evaluation today patient appears to be doing about the same in regard to his legs he may have just a slight amount of improvement with regard to the drainage probably Keystone topical antibiotics are helping in this regard to some degree. Fortunately there does not appear to be any signs of active infection systemically which is great news. No fevers, chills, nausea, vomiting, or diarrhea. 07-16-2022 upon evaluation today patient appears to be doing well currently in regard to his wound. He has been tolerating the dressing changes without complication. Fortunately there does not appear to be any signs of active infection locally nor systemically at this time. With that being said he cannot keep the wraps up he tells me on the left side he had to cut this down because it got too tight. He has been using his pumps but he is on the right side the wrap actually straight down causing some pushing around the central part of his leg just below the calf I think this is a bigger risk for him that help at this point. I think that we may need to try something different he should be getting his compression socks shortly he tells me they were ordered last Thursday. 3/6; ; this is a patient who lives in [place]. He has severe bilateral lymphedema. He has compression  pumps, we have been using kerlix Ace wrap Keystone. He is changing the dressing. We do not have home health. 08-06-2022 upon evaluation today patient appears to be doing a little better in regard to his wounds in general at this point.  Fortunately there does not appear to be any signs of active infection locally nor systemically at this time which is great news and overall I am extremely pleased with where we stand today. 08-13-2022 upon evaluation today patient appears to actually be doing significantly better compared to last week. He actually did go to the hospital I told him that he needed to when he left here and he actually did go. With that being said they actually ended up admitting him he was having shortness of breath and I thought it might be related to congestive heart failure turns out he actually had a pulmonary embolism. Subsequently they were able to get him off of the Coumadin switching over to Eliquis to get things stabilized in that regard they also had them wrapped and got his swelling under control on his legs he actually looks much better pretty much across the board at this point. I am very pleased in that regard. With that being said I am very happy that he finally went that could have been a very dangerous situation. 08-20-2022 upon evaluation today patient appears to be doing well currently in regard to his wound. Has been tolerating the dressing changes without complication. Fortunately there does not appear to be any signs of active infection locally nor systemically at this time. I think his legs are doing better there is some need for sharp debridement today. 08-27-2022 upon evaluation patient is actually making excellent progress. I am actually very pleased with where he stands and I think that he is moving in the right direction. In general I think that we are looking pretty good at the moment. 09-03-2022 upon evaluation today patient appears to be doing well currently in regard to his wound. He is actually tolerating dressing changes on the left and right leg without complication. Fortunately I do not see any need for debridement of the left leg the right leg I think we probably do some need to perform  some debridement here. 09-10-2022 upon evaluation today patient's wounds actually showed signs of improvement in both legs I do not see much is going require debridement today which was great news. Fortunately I do not see any evidence of infection which I think is also excellent he seems to be using his pumps and doing everything right I am happy about how this is progressing at this point. 09-17-2022 upon evaluation today patient appears to be doing decently well in regard to his wounds. He has been tolerating the dressing changes without complication. Fortunately there does not appear to be any signs of active infection at this time which is good news. 09-24-2022 upon evaluation today patient appears to be doing well currently in regard to his wounds. He has been tolerating the dressing changes without complication. Fortunately there does not appear to be any signs of active infection locally nor systemically which is great news. No fevers, chills, nausea, vomiting, or diarrhea. 10-08-2022 upon evaluation today patient appears to be doing excellent currently in regard to his wound. He has been tolerating the dressing changes without complication though it does not sound like he has been using his compression wraps for a bit here. He does think he was doing better with the [facility] topical antibiotics we can  definitely go back to using that but I think the biggest issue here is that his swelling is just very out of control and needs to be under control. I discussed that with him today. 10-15-2022 upon evaluation today patient appears to be doing well currently in regard to his wounds. He is actually making some progress which is good news. Fortunately I do not see any evidence of active infection locally nor systemically which is great news as well. No fevers, chills, nausea, vomiting, or diarrhea. He does have a callused area on the plantar aspect of his left foot which is actually causing some pain and  he wonders if I can trim this down for him. 10-22-2022 upon evaluation today patient seems to be making progress. He is actually doing quite well and very pleased in that regard. I do not see any signs of active infection at this time. 11-05-2022 upon evaluation patient appears to be making progress although slowly towards closure. He seems to be doing well with regard to his legs were still using the [facility] [place] topical antibiotics and he seems to be doing quite well. He is going require some sharp debridement today. 11-19-2022 upon evaluation today patient unfortunately appears to be extremely swollen at this point. He tells me that he ran out of supplies he also tells me his leg started leaking more because of not having supplies he was unable to wear his compression wraps that is the juxta fit compression wraps. Therefore his legs are extremely swollen much larger than normal and do not appear to be doing well at all today. He is going require some debridement I also think he is going require [place] to perform compression wrapping today. 11-26-2022 upon evaluation today patient appears to be doing well currently in regard to his legs as far as infection is concerned I see nothing that appears to be infected. Fortunately I do not see any signs of active infection systemically either which is also good news. With that being said he still is extremely swollen Gehl, Hale ([id number]) [phone].pdf Page 9 of 11 as far as his legs are concerned. I do not see any signs of overall worsening but also do not see any signs of significant improvement which is the major issue here. 12-03-2022 upon evaluation today patient appears to be doing poorly still in regard to his legs the apparently has been taken off the wraps and not leaving the week by week. With that being said he has not had really the dressings to put on the release that is running out and subsequently though he is using his  wraps I am not sure that he has been keeping them on like he needs to. I explained to him I really wanted to have the wraps that I put on him left on until he comes back to see me so that we can keep the compression under better control. He voiced understanding today he tells me that he was "confused about that". Nonetheless based on what I am seeing right now also think that he is up on his feet too much I think he needs to get the feet elevated. 7/24; this is a patient with severe bilateral stage III lymphedema. On the right lower leg medially is a large area of macerated denuded skin was too small deeper areas in this. On the left leg he is 2 areas that are a little more standard in terms of wounds. Massive lymphedema bilaterally. He has compression pumps at home which  he uses for 1 hour twice a day Objective Constitutional Patient is hypertensive.. Pulse regular and within target range for patient.Marland Kitchen Respirations regular, non-labored and within target range.. Temperature is normal and within the target range for the patient.Marland Kitchen Appears in no distress. Vitals Time Taken: 2:39 PM, Height: 74 in, Weight: 250 lbs, BMI: 32.1, Temperature: 97.7 F, Pulse: 86 bpm, Respiratory Rate: 18 breaths/min, Blood Pressure: 161/61 mmHg. Cardiovascular His dorsalis pedis pulses are palpable bilaterally. Because of the edema his posterior tibial pulses are difficult to feel his foot is warm right down to his toes I do not think there is a significant arterial issue here although at the bedside this would be hard to absolutely be certain about.. General Notes: Wound exam; bilateral lower extremities severe stage III lymphedema with skin changes secondary to this. The area on the right lower anterior leg is a large denuded macerated area. Vigorous debridement with Vashe and gauze by myself and the intake nurse to get to a somewhat cleaner surface. There is 2 deeper areas in this. Nothing however probes deeply On the  left smaller open areas x 2 Integumentary (Hair, Skin) Wound #11 status is Open. Original cause of wound was Gradually Appeared. The date acquired was: 10/19/2022. The wound has been in treatment 7 weeks. The wound is located on the Left,Posterior Lower Leg. The wound measures 3cm length x 3.5cm width x 0.1cm depth; 8.247cm^2 area and 0.825cm^3 volume. There is Fat Layer (Subcutaneous Tissue) exposed. There is no tunneling or undermining noted. There is a medium amount of serosanguineous drainage noted. There is small (1-33%) pink granulation within the wound bed. There is a large (67-100%) amount of necrotic tissue within the wound bed including Adherent Slough. The periwound skin appearance had no abnormalities noted for texture. The periwound skin appearance had no abnormalities noted for color. The periwound skin appearance exhibited: Dry/Scaly. The periwound skin appearance did not exhibit: Maceration. Periwound temperature was noted as No Abnormality. Wound #6 status is Open. Original cause of wound was Gradually Appeared. The date acquired was: 03/05/2022. The wound has been in treatment 37 weeks. The wound is located on the Right,Medial Lower Leg. The wound measures 14cm length x 27cm width x 0.6cm depth; 296.881cm^2 area and 178.128cm^3 volume. There is Fat Layer (Subcutaneous Tissue) exposed. There is no tunneling or undermining noted. There is a medium amount of serosanguineous drainage noted. The wound margin is distinct with the outline attached to the wound base. There is no granulation within the wound bed. There is a large (67- 100%) amount of necrotic tissue within the wound bed including Adherent Slough. The periwound skin appearance exhibited: Scarring, Dry/Scaly, Hemosiderin Staining. The periwound skin appearance did not exhibit: Callus, Crepitus, Excoriation, Induration, Rash, Maceration, Atrophie Blanche, Cyanosis, Ecchymosis, Mottled, Pallor, Rubor, Erythema. Wound #7 status is  Open. Original cause of wound was Gradually Appeared. The date acquired was: 03/05/2022. The wound has been in treatment 37 weeks. The wound is located on the Left,Medial Lower Leg. The wound measures 2.5cm length x 1.5cm width x 0.3cm depth; 2.945cm^2 area and 0.884cm^3 volume. There is Fat Layer (Subcutaneous Tissue) exposed. There is no tunneling or undermining noted. There is a medium amount of serosanguineous drainage noted. The wound margin is distinct with the outline attached to the wound base. There is no granulation within the wound bed. There is a large (67-100%) amount of necrotic tissue within the wound bed including Adherent Slough. The periwound skin appearance exhibited: Scarring, Maceration, Hemosiderin Staining. The periwound skin  appearance did not exhibit: Callus, Crepitus, Excoriation, Induration, Rash, Dry/Scaly, Atrophie Blanche, Cyanosis, Ecchymosis, Mottled, Pallor, Rubor, Erythema. 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 There was a Double Layer Compression Therapy Procedure [name], [name] [id number] Page 10 of 11 by [name], RN. Post procedure Diagnosis Wound #11: Same as Pre-Procedure Wound #6 Pre-procedure diagnosis of Wound #6 is a Lymphedema located on the Right,Medial Lower Leg . There was a Double Layer Compression Therapy Procedure by [name], RN. Post procedure Diagnosis Wound #6: Same as Pre-Procedure Wound #7 Pre-procedure diagnosis of Wound #7 is a Lymphedema  located on the Left,Medial Lower Leg . There was a Double Layer Compression Therapy Procedure by [name], RN. Post procedure Diagnosis Wound #7: Same as Pre-Procedure Plan Follow-up Appointments: Return Appointment in 1 week. [name] PA-C Wednesday 12/17/22 at 2pm (already has an appt) Other: - Please ask your Primary Care Practioner or Foot Doctor to order orthotic shoes. Anesthetic: (In clinic) Topical Lidocaine 5% applied to wound bed Bathing/ Shower/ Hygiene: May shower and wash wound with soap and water. - apply clean dressing after bathing. Do not soak legs in tub, may rinse in shower Edema Control - Lymphedema / SCD / Other: Lymphedema Pumps. Use Lymphedema pumps on leg(s) 2-3 times a day for 45-60 minutes. If wearing any wraps or hose, do not remove them. Continue exercising as instructed. - *****pump 3 times a day for an hour each time.***** Elevate legs to the level of the heart or above for 30 minutes daily and/or when sitting for 3-4 times a day throughout the day. - +++ Important+++ ELEVATE THE LEGS>>>>>>>>>> Avoid standing for long periods of time. Exercise regularly Compression stocking or Garment 30-40 mm/Hg pressure to: - Juxtafit Essentials Long for bilateral lower legs; quantity three juxtafit essentials per limb. Other Edema Control Orders/Instructions: - When sitting please keep your feet up, keep legs at Heart Level or above. Use pillows behind calf to for comfort. Off-Loading: Other: - When sitting please keep your feet up, keep legs at Heart Level or above. Use pillows behind calf to for comfort. Additional Orders / Instructions: Follow Nutritious Diet - Try and increase protein intake. Goal of protein intake is 60g-100g. WOUND #11: - Lower Leg Wound Laterality: Left, Posterior Cleanser: Soap and Water 1 x Per Week/30 Days Discharge Instructions: May shower and wash wound with dial antibacterial soap and water prior to dressing change. Peri-Wound Care:  Sween Lotion (Moisturizing lotion) 1 x Per Week/30 Days Discharge Instructions: Apply moisturizing lotion as directed Prim Dressing: Maxorb Extra Ag+ Alginate Dressing, 4x4.75 (in/in) 1 x Per Week/30 Days ary Discharge Instructions: Apply to wound bed as instructed Secondary Dressing: ABD Pad, 5x9 1 x Per Week/30 Days Discharge Instructions: Apply over primary dressing as directed. Secondary Dressing: Woven Gauze Sponge, Non-Sterile 4x4 in 1 x Per Week/30 Days Discharge Instructions: Apply over primary dressing as directed. Secondary Dressing: Zetuvit Plus 4x8 in 1 x Per Week/30 Days Discharge Instructions: Apply over primary dressing as directed. Com pression Wrap: Urgo K2, (equivalent to a 4 layer) two layer compression system, regular 1 x Per Week/30 Days Discharge Instructions: Apply Jeryl [place]  K2 as directed (alternative to 4 layer compression). WOUND #6: - Lower Leg Wound Laterality: Right, Medial Cleanser: Soap and Water 1 x Per Week/30 Days Discharge Instructions: May shower and wash wound with dial antibacterial soap and water prior to dressing change. Peri-Wound Care: Sween Lotion (Moisturizing lotion) 1 x Per Week/30 Days Discharge Instructions: Apply moisturizing lotion as directed Prim Dressing: Maxorb Extra Ag+ Alginate Dressing, 4x4.75 (in/in) 1 x Per Week/30 Days ary Discharge Instructions: Apply to wound bed as instructed Secondary Dressing: ABD Pad, 5x9 1 x Per Week/30 Days Discharge Instructions: Apply over primary dressing as directed. Secondary Dressing: Woven Gauze Sponge, Non-Sterile 4x4 in 1 x Per Week/30 Days Discharge Instructions: Apply over primary dressing as directed. Secondary Dressing: Zetuvit Plus 4x8 in 1 x Per Week/30 Days Discharge Instructions: Apply over primary dressing as directed. Com pression Wrap: Urgo K2, (equivalent to a 4 layer) two layer compression system, regular 1 x Per Week/30 Days Discharge Instructions: Apply Urgo K2 as directed (alternative  to 4 layer compression). WOUND #7: - Lower Leg Wound Laterality: Left, Medial Cleanser: Soap and Water 1 x Per Week/30 Days Discharge Instructions: May shower and wash wound with dial antibacterial soap and water prior to dressing change. Peri-Wound Care: Sween Lotion (Moisturizing lotion) 1 x Per Week/30 Days Discharge Instructions: Apply moisturizing lotion as directed Prim Dressing: Maxorb Extra Ag+ Alginate Dressing, 4x4.75 (in/in) 1 x Per Week/30 Days ary Discharge Instructions: Apply to wound bed as instructed Secondary Dressing: ABD Pad, 5x9 1 x Per Week/30 Days Discharge Instructions: Apply over primary dressing as directed. Secondary Dressing: Woven Gauze Sponge, Non-Sterile 4x4 in 1 x Per Week/30 Days Discharge Instructions: Apply over primary dressing as directed. Secondary Dressing: Zetuvit Plus 4x8 in 1 x Per Week/30 Days Discharge Instructions: Apply over primary dressing as directed. Com pression Wrap: Urgo K2, (equivalent to a 4 layer) two layer compression system, regular 1 x Per Week/30 Days Discharge Instructions: Apply Urgo K2 as directed (alternative to 4 layer compression). Zipper, [name] [phone].pdf Page 11 of 11 1. Severe bilateral lymphedema with resultant bilateral wounds. 2. The more problematic area is on the medial right lower leg. Macerated denuded skin. Vigorously washed off with Vashe solution and gauze. We are going to use silver alginate on all the wound areas ABDs and I continued with the Urgo K2 although I must admit I had some thoughts about a standard 4-layer Profore. He finds the latter uncomfortable. I would like him to increase his compression pump usage to 3 times a day 1 hour and I talked to him about this at length 3. No infection. I do not think there is a primary arterial issue here Electronic Signature(s) Signed: 12/10/2022 4:12:50 PM By: Baltazar Najjar MD Entered By: Baltazar Najjar on 12/10/2022  15:48:08 -------------------------------------------------------------------------------- SuperBill Details Patient Name: Date of Service: Grupp, [name] 12/10/2022 Medical Record Number: [id number] Patient Account Number: [id number] Date of Birth/Sex: Treating RN: 10-31-1945 (77 y.o. [name], [name] Primary Care Provider: [name] Other Clinician: Referring Provider: Treating Provider/Extender: Valentina Gu Weeks in Treatment: 37 Diagnosis Coding ICD-10 Codes Code Description [phone] Chronic venous hypertension (idiopathic) with ulcer and inflammation of bilateral lower extremity I89.0 Lymphedema, not elsewhere classified 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 other part of right lower leg with other specified severity L84 Corns and callosities Facility Procedures : CPT4: Code [id number] Description: 81 BILATERAL: Application of multi-layer  venous compression system; leg (below knee), including ankle and t. Modifier: Quantity: 1 Physician Procedures : CPT4 Code Description Modifier [id number] - WC PHYS LEVEL 3 - EST PT ICD-10 Diagnosis Description 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 other part of right lower leg with other specified severity L97.828 Non-pressure chronic ulcer of other part of  left lower leg with other specified severity I87.333 Chronic venous hypertension (idiopathic) with ulcer and inflammation of bilateral lower extremity I89.0 Lymphedema, not elsewhere classified Quantity: 1 Electronic Signature(s) Signed: 12/10/2022 4:12:50 PM By: Baltazar Najjar MD Entered By: Baltazar Najjar on 12/10/2022 15:48:32

## 2022-12-17 ENCOUNTER — Encounter (HOSPITAL_BASED_OUTPATIENT_CLINIC_OR_DEPARTMENT_OTHER): Payer: Medicare Other | Admitting: Physician Assistant

## 2022-12-17 DIAGNOSIS — I87333 Chronic venous hypertension (idiopathic) with ulcer and inflammation of bilateral lower extremity: Secondary | ICD-10-CM | POA: Diagnosis not present

## 2022-12-17 NOTE — Progress Notes (Signed)
[name], [name] ([id number]) [id number] Page 1 of 12 Visit Report for 12/17/2022 Chief Complaint Document Details Patient Name: Date of Service: [place], [name] 12/17/2022 2:00 PM Medical Record Number: [id number] Patient Account Number: [id number] Date of Birth/Sex: Treating RN: 1946/03/28 (77 y.o. M) Primary Care Provider: [name] Other Clinician: Referring Provider: Treating Provider/Extender: [name] Weeks in Treatment: 32 Information Obtained from: Patient Chief Complaint 02/23/2020; patient is here for wounds on his bilateral lower legs in the setting of severe lymphedema 03/26/2022; patient is here for wounds on his bilateral lower legs medial aspect Electronic Signature(s) Signed: 12/17/2022 2:36:03 PM By: [name] PA-C Entered By: [name] on 12/17/2022 14:36:03 -------------------------------------------------------------------------------- Debridement Details Patient Name: Date of Service: [name], [name] 12/17/2022 2:00 PM Medical Record Number: [id number] Patient Account Number: [id number] Date of Birth/Sex: Treating RN: 1945-05-22 (77 y.o. [name] Primary Care Provider: [name] Other Clinician: Referring Provider: Treating Provider/Extender: [name] Weeks in Treatment: 38 Debridement Performed for Assessment: Wound #12 Left,Anterior Lower Leg Performed By: Physician [name], PA Debridement Type: Chemical/Enzymatic/Mechanical Agent Used: gauze and wound cleanser Level of Consciousness (Pre-procedure): Awake and Alert Pre-procedure Verification/Time Out No Taken: Percent of Wound Bed Debrided: Bleeding: Moderate Hemostasis Achieved: Pressure Response to Treatment: Procedure was tolerated well Level of Consciousness (Post- Awake and Alert procedure): Post Debridement Measurements of Total Wound Length: (cm) 3 Width: (cm) 1.5 Depth: (cm) 0.2 Volume:  (cm) 0.707 Character of Wound/Ulcer Post Debridement: Requires Further Debridement Post Procedure Diagnosis Same as Pre-procedure Electronic Signature(s) Signed: 12/17/2022 4:54:39 PM By: [name] PA-C Signed: 12/17/2022 5:15:20 PM By: [name] RN, [name] Har[place], [name] ([id number]) [id number] Page 2 of 12 Entered By: [name] on 12/17/2022 14:51:57 -------------------------------------------------------------------------------- HPI Details Patient Name: Date of Service: [name], [name] 12/17/2022 2:00 PM Medical Record Number: [id number] Patient Account Number: [id number] Date of Birth/Sex: Treating RN: [birth year] (77 y.o. M) Primary Care Provider: [name] Other Clinician: Referring Provider: Treating Provider/Extender: [name] Weeks in Treatment: 38 History of Present Illness HPI Description: ADMISSION 02/23/2020 Patient is a 77 year old man who lives in [place] who arrives accompanied by his wife. He has a history of chronic lymphedema and venous insufficiency in his bilateral lower legs which may have something to do that with having a history of DVT as well as being treated for prostate cancer. In any case he recently got compression pumps at home but compliance has been an issue here. He has compression stockings however they are probably not sufficient enough to control swelling. They tell us that things deteriorated for him in late August he was admitted to [facility] for 7 days. This was with cellulitis I think of his bilateral lower legs. Discharge he was noted to have wounds on his bilateral lower legs. He was discharged on Bactrim. They tried to get him home health through [facility] of course they declined him. His wife is been wrapping these applying some form of silver foam dressing. He has a history of wounds before although nothing that would not heal with basic home topical  dressings. He has 2 areas on the left medial, left anterior and left lateral and a smaller area on the right medial. All of these have considerable depth. Past medical history includes iron deficiency anemia, lymphedema followed by the rehab center at [facility] with lymphedema wraps I believe, DVT on chronic anticoagulation, prostate cancer, chronic  venous insufficiency, hypertension. As mentioned he has compression pumps but does not use them. ABIs in our clinic were noncompressible bilaterally 10/14; patient with severe bilateral lymphedema right greater than left. He came in with bilateral lower extremity wounds left greater than right. Even though the right side has more of the edema most of the wounds here almost closed on the right medial. He has 3 remaining wounds on the left We have been using silver alginate under 4-layer compression I have been trying to get him to be compliant with his external compression pumps 10/21; patient with 3 small wounds on the left leg and 1 on the right medial in the setting of severe lymphedema and chronic venous insufficiency. We have been using silver alginate under 4-layer compression he is using his external compression pumps twice a day 11/4; ARTERIAL STUDIES on the right show an ABI of 1.02 TBI of 0.858 with biphasic waveforms on the left 0.98 with a TBI of 0.55 and biphasic waveforms. Does not look like he has significant arterial disease. We are treating him for lymphedema he has compression pumps. He has punched-out areas on the left anterior left lateral and right medial lower extremities 11/11; after we obtained his arterial studies I put him in 4 layer compression. He is using his compression pumps probably once a day although I have asked him to do twice. Primary dressing to the wound is silver collagen he has severe lymphedema likely secondary to chronic venous insufficiency. Wounds on the left lateral, left medial and left anterior and a small  area on the right medial 12/2; the area on the right anterior lower leg has healed. We initially thought that the area medially had healed as well however when her discharge nurse came in she detected fluid in the wound simply opened up. This is actually worse than I remember this pain. The area on the left lateral potentially slightly smaller He is also complaining about pain in his left hand he says that this is actually been getting some better he has been using topical creams on this. She asked that I look at this 12/9 after last weeks issues we have 2 wounds one on the right medial lower leg and 1 on the left lateral. Both of these are in the same condition. I think because of thickened skin secondary to chronic lymphedema these wounds actually have depth of almost 0.8 cm. 12/16; the patient has 2 small but deep wounds one on the right medial and one on the left lateral. The right medial is actually the worst of these. He arrives in clinic today with absolutely terrible edema in the right leg apparently his 4-layer wrap fell down to just above his ankle he did not think about this he is apparently been continuing to use his compression pump twice a day. The left leg looks a lot better. 05/09/2020 upon evaluation today patient appears to be doing decently well in regard to his wounds. Everything is measuring smaller the right leg still has a little bit deeper wound in the left seems to be almost completely healed in my opinion I am very pleased in general with how things are progressing. He has a 4- layer compression wrap we have been using endoform today we will probably have to use collagen just based on the fact that we do not have endoform it is on order. 1/6; the patient's wound on the left lateral lower leg has healed. Still has 1 on the right medial. He  has severe bilateral lymphedema right greater than left. Using compression pumps at home twice a day. 1/13; left lateral lower leg is  still healed. He has a deep punched out rectangular shaped wound on the right medial calf. Looking down at this it appears that he is attempting to epithelialize around the edges of the wound and on the base as well. His edema is reasonably well controlled we have been using collagen with absolutely no effect 1/20; left lateral lower leg remains closed he has extremitease stockings. The area on the right medial calf I aggressively debrided last week measures larger but the surface looks better. We have been using Hydrofera Blue. We ran Oasis through his insurance but we have not seen the results of this 1/27; left lower leg wound with chronic venous insufficiency and secondary lymphedema. I did aggressive debridement on this last week the wound seems to have come in healthy looking surface using Hydrofera Blue. He was denied for Oasis 2/3; small divot in the right medial lower leg. Under illumination the walls of this divot are epithelialized however the base has slough which I removed with a curette we have been using Hydrofera Blue 2/10 small divot on the right medial lower leg pinpoint illumination at the base of this cone-shaped wound. We have been using Hydrofera Blue but I will switch to calcium alginate this week 2/17; the small divot on the right medial lower leg is fully epithelialized. There is no visible open area under illumination. He has his own stocking for the right leg similar to the one he has been wearing on the left. 03/26/2022; Blakely, [name] ([id number]) [id number] Page 3 of 12 READMISSION This is a now 77 year old man that we had in the clinic from 02/23/2020 through 07/05/2020. At that point he had bilateral lower extremity wounds left greater than right in the setting of severe lymphedema. He had already obtained compression pumps ordered for him I think from the wound care clinic in [facility] so I do not really have record of what he  has been using. He claims to be using them once a day but there is a problem with the sleeve on the left leg. About 2 weeks ago he was hospitalized from 03/11/2022 through 03/14/2022 with diastolic congestive heart failure. 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 He was diuresed. Developed some prerenal azotemia and he has not been taking any diuretics currently. He has not been putting stockings on his legs since he got out of hospital and still has his legs dependent for long periods. Past medical history history of prostate cancer treated with prostatectomy and radiation this was apparently about 8 years ago, history of DVT on chronic Coumadin, history of lymphedema was managed for a while at the clinic in [place]. History of inguinal hernia repair in September 22, hypertension, stage IIIb chronic renal failure ABIs today were noncompressible on the right 1.12 on the left 04-02-2022 upon evaluation today patient appears to be doing well currently in regard to his legs I do feel like both areas that are draining are actually much drier than they were in the picture last week although the left is drier than the right. He is tolerating the 4-layer compression wraps at this point he did contact the pump company and they are actually working on getting him a new compression sleeve for one of his legs which have previously popped and was not functioning properly. 04-23-2022 upon evaluation  today patient appears to be doing well currently in regard to his wounds on the legs. I am actually very pleased with where things stand and I do feel like that we are headed in the right direction. Fortunately there is no sign of active infection locally or systemically at this time. 05-07-2022 upon evaluation today patient appears to be doing well currently in regard to his wounds in fact things are showing signs of improvement which is good news I do not see too  much that actually appears to be open and I am very pleased in that regard. No fevers, chills, nausea, vomiting, or diarrhea. 05-21-2022 upon evaluation today patient appears to be doing somewhat poorly in regard to drainage of his lower extremities bilaterally. The right is greater than left as far as the weeping area. Nonetheless it seems to be getting worse not better. He actually has pitting edema which is at least 2+ to the thighs and I am concerned about the fact that he is may be fluid overloaded in general and that is the reason why we cannot get this under control. I know he is not using his pumps all the time because he actually told the nurse that he was either going to pump or he was going to use his fluid pills but not do both. For that reason I do think that he needs to be really doing both in order to get the fluid out as effectively as possible obviously with the 4-layer compression wraps were doing as much as we can from a compression standpoint but it is really not enough. He tells me that he elevates his leg is much as he can in between pumping and other activity throughout the day. 05-28-2022 upon evaluation today patient appears to be doing better in regard to his wounds although the measurements may be a little bit larger this is a very difficult wound to heal it is very indistinct in a lot of areas. Nonetheless there is can be some need for sharp debridement in regard to both medial and lateral legs. Fortunately I see no signs of active infection locally nor systemically at this time. No fevers, chills, nausea, vomiting, or diarrhea. 06-04-2022 upon evaluation today patient appears to be doing poorly in general in regard to the wounds on his legs. He still continues to have a tremendous amount of fluid not just in the lower portion of his leg but to be honest his thigh where he has 2-3+ pitting edema in the thigh as well. Unfortunately I do not know that we will be able to get this  healed effectively and keep it healed on the lower extremities unless he gets the overall fluid situation taking and under control. Fortunately I do not see any signs of infection locally nor systemically which is great news. He just seems to be very fluid overloaded. 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 He has been tolerating the dressing changes with the compression wraps without complication. Fortunately I do not see any evidence of infection at this time which is great news. No fevers, chills, nausea, vomiting, or diarrhea. 06-18-2022 upon evaluation today patient appears to be doing well currently in regard to his wounds as far as not looking like they are terribly infected but nonetheless I am concerned about a subacute infection secondary to the fact that he continues to have spreading despite the compression therapy. We actually did do  an Radio broadcast assistant on him last week this is actually the first wrap that actually stayed up everything else has been sliding down quite significantly. Fortunately there does not appear to be any signs of infection systemically at this time. With that being said I do believe that locally there seems to be an issue going on here and again I Ernie Hew do a PCR culture to see what that shows also think that I am going to put him on a broad-spectrum antibiotic, doxycycline to see how that will help as well. He does tell me that coming into the clinic today that he was feeling short of breath like "he was about to have a heart attack" because he was having such a hard time breathing. He says that he told this to Dr. Jodelle Green his cardiologist as well when he was evaluated in the past 1 to 2 weeks. 07-02-2022 upon evaluation today patient appears to be doing poorly currently in regard to his wound. He has been tolerating the dressing changes. Unfortunately he has  not had any compression wraps on for the past week because he was unable to make it in for his appointment last week. With that being said he has a significant amount of drainage he tells me has been using his pumps but despite this in the pumps he still has been draining quite a bit. The drainage is also somewhat purulent unfortunately. We did attempt to get in touch with his cardiologist last week unfortunately we were unable to get up with him I did advise that the patient needs to get in touch with him upon leaving today in order to make sure they know he is on the new antibiotics I am going to send him this will be Levaquin and Augmentin. 07-09-2022 upon evaluation today patient appears to be doing about the same in regard to his legs he may have just a slight amount of improvement with regard to the drainage probably Keystone topical antibiotics are helping in this regard to some degree. Fortunately there does not appear to be any signs of active infection systemically which is great news. No fevers, chills, nausea, vomiting, or diarrhea. 07-16-2022 upon evaluation today patient appears to be doing well currently in regard to his wound. He has been tolerating the dressing changes without complication. Fortunately there does not appear to be any signs of active infection locally nor systemically at this time. With that being said he cannot keep the wraps up he tells me on the left side he had to cut this down because it got too tight. He has been using his pumps but he is on the right side the wrap actually straight down causing some pushing around the central part of his leg just below the calf I think this is a bigger risk for him that help at this point. I think that we may need to try something different he should be getting his compression socks shortly he tells me they were ordered last Thursday. 3/6; ; this is a patient who lives in [place]. He has severe bilateral lymphedema. He has  compression pumps, we have been using kerlix Ace wrap Keystone. He is changing the dressing. We do not have home health. 08-06-2022 upon evaluation today patient appears to be doing a little better in regard to his wounds in general at this point. Fortunately there does not appear to be any signs of active infection locally nor systemically at this time which is great news and overall I  am extremely pleased with where we stand today. 08-13-2022 upon evaluation today patient appears to actually be doing significantly better compared to last week. He actually did go to the hospital I told him that he needed to when he left here and he actually did go. With that being said they actually ended up admitting him he was having shortness of breath and I thought it might be related to congestive heart failure turns out he actually had a pulmonary embolism. Subsequently they were able to get him off of the Coumadin switching over to Eliquis to get things stabilized in that regard they also had them wrapped and got his swelling under control on his legs he actually looks much better pretty much across the board at this point. I am very pleased in that regard. With that being said I am very happy that he finally went that could have been a very dangerous situation. 08-20-2022 upon evaluation today patient appears to be doing well currently in regard to his wound. Has been tolerating the dressing changes without complication. Fortunately there does not appear to be any signs of active infection locally nor systemically at this time. I think his legs are doing better there is some need for sharp debridement today. [name], [name] ([id number]) [id number] Page 4 of 12 08-27-2022 upon evaluation patient is actually making excellent progress. I am actually very pleased with where he stands and I think that he is moving in the right direction. In general I think that we are looking pretty good at  the moment. 09-03-2022 upon evaluation today patient appears to be doing well currently in regard to his wound. He is actually tolerating dressing changes on the left and right leg without complication. Fortunately I do not see any need for debridement of the left leg the right leg I think we probably do some need to perform some debridement here. 09-10-2022 upon evaluation today patient's wounds actually showed signs of improvement in both legs I do not see much is going require debridement today which was great news. Fortunately I do not see any evidence of infection which I think is also excellent he seems to be using his pumps and doing everything right I am happy about how this is progressing at this point. 09-17-2022 upon evaluation today patient appears to be doing decently well in regard to his wounds. He has been tolerating the dressing changes without complication. Fortunately there does not appear to be any signs of active infection at this time which is good news. 09-24-2022 upon evaluation today patient appears to be doing well currently in regard to his wounds. He has been tolerating the dressing changes without complication. Fortunately there does not appear to be any signs of active infection locally nor systemically which is great news. No fevers, chills, nausea, vomiting, or diarrhea. 10-08-2022 upon evaluation today patient appears to be doing excellent currently in regard to his wound. He has been tolerating the dressing changes without complication though it does not sound like he has been using his compression wraps for a bit here. He does think he was doing better with the [facility] topical antibiotics we can definitely go back to using that but I think the biggest issue here is that his swelling is just very out of control and needs to be under control. I discussed that with him today. 10-15-2022 upon evaluation today patient appears to be doing well currently in regard to his wounds.  He is actually making some progress which is  good news. Fortunately I do not see any evidence of active infection locally nor systemically which is great news as well. No fevers, chills, nausea, vomiting, or diarrhea. He does have a callused area on the plantar aspect of his left foot which is actually causing some pain and he wonders if I can trim this down for him. 10-22-2022 upon evaluation today patient seems to be making progress. He is actually doing quite well and very pleased in that regard. I do not see any signs of active infection at this time. 11-05-2022 upon evaluation patient appears to be making progress although slowly towards closure. He seems to be doing well with regard to his legs were still using the [facility] topical antibiotics and he seems to be doing quite well. He is going require some sharp debridement today. 11-19-2022 upon evaluation today patient unfortunately appears to be extremely swollen at this point. He tells me that he ran out of supplies he also tells me his leg started leaking more because of not having supplies he was unable to wear his compression wraps that is the juxta fit compression wraps. Therefore his legs are extremely swollen much larger than normal and do not appear to be doing well at all today. He is going require some debridement I also think he is going require [place] to perform compression wrapping today. 11-26-2022 upon evaluation today patient appears to be doing well currently in regard to his legs as far as infection is concerned I see nothing that appears to be infected. Fortunately I do not see any signs of active infection systemically either which is also good news. With that being said he still is extremely swollen as far as his legs are concerned. I do not see any signs of overall worsening but also do not see any signs of significant improvement which is the major issue here. 12-03-2022 upon evaluation today patient appears to be doing poorly still  in regard to his legs the apparently has been taken off the wraps and not leaving the week by week. With that being said he has not had really the dressings to put on the release that is running out and subsequently though he is using his wraps I am not sure that he has been keeping them on like he needs to. I explained to him I really wanted to have the wraps that I put on him left on until he comes back to see me so that we can keep the compression under better control. He voiced understanding today he tells me that he was "confused about that". Nonetheless based on what I am seeing right now also think that he is up on his feet too much I think he needs to get the feet elevated. 7/24; this is a patient with severe bilateral stage III lymphedema. On the right lower leg medially is a large area of macerated denuded skin was too small deeper areas in this. On the left leg he is 2 areas that are a little more standard in terms of wounds. Massive lymphedema bilaterally. He has compression pumps at home which he uses for 1 hour twice a day 12-17-2022 upon evaluation today patient's legs though a little bit smaller still continue to have significant issues with weeping here this just does not dry. I really think he needs to be changing this daily and this means we will probably have to try to go back to the Velcro wraps and give this a shot. I think may be  going to the Velcro wraps, changing the dressing to a superabsorber like XtraSorb or the like, and have him elevate his legs and do his pumps 3 times per day is probably going to be the way to go to try to see if we can make some improvement here. Electronic Signature(s) Signed: 12/17/2022 4:32:26 PM By: [name] PA-C Entered By: [name] on 12/17/2022 16:32:26 -------------------------------------------------------------------------------- Physical Exam Details Patient Name: Date of Service: [name], [name] 12/17/2022 2:00 PM Medical Record  Number: [id number] Patient Account Number: [id number] Date of Birth/Sex: Treating RN: June 17, 1945 (77 y.o. M) Primary Care Provider: [name] Other Clinician: Referring Provider: Treating Provider/Extender: [name] Weeks in Treatment: 65 Constitutional Chronically ill appearing but in no apparent acute distress. Respiratory normal breathing without difficulty. Graw, [name] ([id number]) [id number] Page 5 of 12 Psychiatric this patient is able to make decisions and demonstrates good insight into disease process. Alert and Oriented x 3. pleasant and cooperative. Notes Patient's leg again is showing signs of a tremendous amount of weeping. Unfortunately he just does not seem to be making the progress that we need to see. I am trying to do what we can get this under control as quickly as possible. Electronic Signature(s) Signed: 12/17/2022 4:32:47 PM By: [name] PA-C Entered By: [name] on 12/17/2022 16:32:47 -------------------------------------------------------------------------------- Physician Orders Details Patient Name: Date of Service: Frein, [name] 12/17/2022 2:00 PM Medical Record Number: [id number] Patient Account Number: [id number] Date of Birth/Sex: Treating RN: 25-Jul-1945 (76 y.o. [name], [name] Primary Care Provider: [name] Other Clinician: Referring Provider: Treating Provider/Extender: [name] in Treatment: 54 Verbal / Phone Orders: No Diagnosis Coding ICD-10 Coding Code Description [phone] Chronic venous hypertension (idiopathic) with ulcer and inflammation of bilateral lower extremity I89.0 Lymphedema, not elsewhere classified 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 other part of right lower leg with other specified severity L84 Corns and callosities Follow-up  Appointments ppointment in 1 week. [name] PA-C Wednesday Return A 12/24/2022 at 315pm (already has an appt) ppointment in 2 weeks. Leonard Schwartz Wednesday 3pm 12/31/2022 ****extra time 60 minutes**** Return A room 9 Other: - Please ask your Primary Care Practioner or Foot Doctor to order orthotic shoes. Anesthetic (In clinic) Topical Lidocaine 5% applied to wound bed Bathing/ Shower/ Hygiene May shower and wash wound with soap and water. - apply clean dressing after bathing. Do not soak legs in tub, may rinse in shower Edema Control - Lymphedema / SCD / Other Lymphedema Pumps. Use Lymphedema pumps on leg(s) 2-3 times a day for 45-60 minutes. If wearing any wraps or hose, do not remove them. Continue exercising as instructed. - *****pump 3 times a day for an hour each time.***** Elevate legs to the level of the heart or above for 30 minutes daily and/or when sitting for 3-4 times a day throughout the day. - +++ Important+++ ELEVATE THE LEGS>>>>>>>>>> Avoid standing for long periods of time. Exercise regularly Compression stocking or Garment 30-40 mm/Hg pressure to: - Juxtafit Essentials Long for bilateral lower legs go to . apply in the morning and remove at night. Other Edema Control Orders/Instructions: - When sitting please keep your feet up, keep legs at Heart Level or above. Use pillows behind calf to for comfort. Off-Loading Other: - When sitting please keep your feet up, keep legs at Heart Level or above. Use pillows behind  calf to for comfort. Additional Orders / Instructions Follow Nutritious Diet - Try and increase protein intake. Goal of protein intake is 60g-100g. Wound Treatment Wound #11 - Lower Leg Wound Laterality: Left, Posterior Cleanser: Soap and Water 1 x Per Day/30 Days Discharge Instructions: May shower and wash wound with dial antibacterial soap and water prior to dressing change. Peri-Wound Care: Sween Lotion (Moisturizing lotion) 1 x Per Day/30 Days Hollett,  [name] ([id number]) [id number] Page 6 of 12 Discharge Instructions: Apply moisturizing lotion as directed Secondary Dressing: OptiLock Super Absorbent, 5x5.5 (in/in) (Generic) 1 x Per Day/30 Days Discharge Instructions: Apply directly to wound bed as directed Secured With: Kerlix Roll Sterile, 4.5x3.1 (in/yd) 1 x Per Day/30 Days Discharge Instructions: Secure with Kerlix as directed. Secured With: 54M Medipore H Soft Cloth Surgical T ape, 4 x 10 (in/yd) 1 x Per Day/30 Days Discharge Instructions: Secure with tape as directed. Wound #12 - Lower Leg Wound Laterality: Left, Anterior Cleanser: Soap and Water 1 x Per Day/30 Days Discharge Instructions: May shower and wash wound with dial antibacterial soap and water prior to dressing change. Peri-Wound Care: Sween Lotion (Moisturizing lotion) 1 x Per Day/30 Days Discharge Instructions: Apply moisturizing lotion as directed Secondary Dressing: OptiLock Super Absorbent, 5x5.5 (in/in) (Generic) 1 x Per Day/30 Days Discharge Instructions: Apply directly to wound bed as directed Secured With: Kerlix Roll Sterile, 4.5x3.1 (in/yd) 1 x Per Day/30 Days Discharge Instructions: Secure with Kerlix as directed. Secured With: 54M Medipore H Soft Cloth Surgical T ape, 4 x 10 (in/yd) 1 x Per Day/30 Days Discharge Instructions: Secure with tape as directed. Wound #6 - Lower Leg Wound Laterality: Right, Medial Cleanser: Soap and Water 1 x Per Day/30 Days Discharge Instructions: May shower and wash wound with dial antibacterial soap and water prior to dressing change. Peri-Wound Care: Sween Lotion (Moisturizing lotion) 1 x Per Day/30 Days Discharge Instructions: Apply moisturizing lotion as directed Secondary Dressing: OptiLock Super Absorbent, 5x5.5 (in/in) (Generic) 1 x Per Day/30 Days Discharge Instructions: Apply directly to wound bed as directed Secured With: Kerlix Roll Sterile, 4.5x3.1 (in/yd) 1 x Per Day/30 Days Discharge  Instructions: Secure with Kerlix as directed. Secured With: 54M Medipore H Soft Cloth Surgical T ape, 4 x 10 (in/yd) 1 x Per Day/30 Days Discharge Instructions: Secure with tape as directed. Wound #7 - Lower Leg Wound Laterality: Left, Medial Cleanser: Soap and Water 1 x Per Day/30 Days Discharge Instructions: May shower and wash wound with dial antibacterial soap and water prior to dressing change. Peri-Wound Care: Sween Lotion (Moisturizing lotion) 1 x Per Day/30 Days Discharge Instructions: Apply moisturizing lotion as directed Secondary Dressing: OptiLock Super Absorbent, 5x5.5 (in/in) (Generic) 1 x Per Day/30 Days Discharge Instructions: Apply directly to wound bed as directed Secured With: Kerlix Roll Sterile, 4.5x3.1 (in/yd) 1 x Per Day/30 Days Discharge Instructions: Secure with Kerlix as directed. Secured With: 54M Medipore H Soft Cloth Surgical T ape, 4 x 10 (in/yd) 1 x Per Day/30 Days Discharge Instructions: Secure with tape as directed. Electronic Signature(s) Signed: 12/17/2022 4:54:39 PM By: [name] PA-C Signed: 12/17/2022 5:15:20 PM By: [name] RN, [name] Entered By: [name] on 12/17/2022 14:49:45 Problem List Details -------------------------------------------------------------------------------- [name], [name] ([id number]) [id number] Page 7 of 12 Patient Name: Date of Service: Heyboer, [name] 12/17/2022 2:00 PM Medical Record Number: [id number] Patient Account Number: [id number] Date of Birth/Sex: Treating RN: [birth year] (77 y.o. M) Primary Care Provider: [name] Other Clinician: Referring Provider: Treating Provider/Extender: Leveda Anna, Claria Dice  in Treatment: 38 Active Problems ICD-10 Encounter Code Description Active Date MDM Diagnosis I87.333 Chronic venous hypertension (idiopathic) with ulcer and inflammation of 06/11/2022 No Yes bilateral lower extremity I89.0 Lymphedema, not elsewhere classified  03/26/2022 No Yes L97.828 Non-pressure chronic ulcer of other part of left lower leg with other specified 03/26/2022 No Yes severity 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 other part of right lower leg with other specified 03/26/2022 No Yes severity L84 Corns and callosities 10/15/2022 No Yes Inactive Problems Resolved Problems Electronic Signature(s) Signed: 12/17/2022 2:35:44 PM By: [name] PA-C Entered By: [name] on 12/17/2022 14:35:43 -------------------------------------------------------------------------------- Progress Note Details Patient Name: Date of Service: [name], [name] 12/17/2022 2:00 PM Medical Record Number: [id number] Patient Account Number: [id number] Date of Birth/Sex: Treating RN: 03-16-1946 (77 y.o. M) Primary Care Provider: [name] Other Clinician: Referring Provider: Treating Provider/Extender: [name] Weeks in Treatment: 61 Subjective Chief Complaint Information obtained from Patient 02/23/2020; patient is here for wounds on his bilateral lower legs in the setting of severe lymphedema 03/26/2022; patient is here for wounds on his bilateral lower legs medial aspect History of Present Illness (HPI) ADMISSION 02/23/2020 Patient is a 77 year old man who lives in [place] who arrives accompanied by his wife. He has a history of chronic lymphedema and venous insufficiency in his bilateral lower legs which may have something to do that with having a history of DVT as well as being treated for prostate cancer. In any case he recently got compression pumps at home but compliance has been an issue here. He has compression stockings however they are probably not sufficient [name], [name] ([id number]) [phone].pdf Page 8 of 12 enough to control swelling. They tell us that things deteriorated for him in late August he was admitted to [facility] for 7 days. This was with cellulitis I think of his bilateral lower  legs. Discharge he was noted to have wounds on his bilateral lower legs. He was discharged on Bactrim. They tried to get him home health through [facility] of course they declined him. His wife is been wrapping these applying some form of silver foam dressing. He has a history of wounds before although nothing that would not heal with basic home topical dressings. He has 2 areas on the left medial, left anterior and left lateral and a smaller area on the right medial. All of these have considerable depth. Past medical history includes iron deficiency anemia, lymphedema followed by the rehab center at [facility] with lymphedema wraps I believe, DVT on chronic anticoagulation, prostate cancer, chronic venous insufficiency, hypertension. As mentioned he has compression pumps but does not use them. ABIs in our clinic were noncompressible bilaterally 10/14; patient with severe bilateral lymphedema right greater than left. He came in with bilateral lower extremity wounds left greater than right. Even though the right side has more of the edema most of the wounds here almost closed on the right medial. He has 3 remaining wounds on the left We have been using silver alginate under 4-layer compression I have been trying to get him to be compliant with his external compression pumps 10/21; patient with 3 small wounds on the left leg and 1 on the right medial in the setting of severe lymphedema and chronic venous insufficiency. We have been using silver alginate under 4-layer compression he is using his external compression pumps twice a day 11/4; ARTERIAL STUDIES on the right show an ABI of 1.02 TBI of 0.858 with  biphasic waveforms on the left 0.98 with a TBI of 0.55 and biphasic waveforms. Does not look like he has significant arterial disease. We are treating him for lymphedema he has compression pumps. He has punched-out areas on the left anterior left lateral and right medial  lower extremities 11/11; after we obtained his arterial studies I put him in 4 layer compression. He is using his compression pumps probably once a day although I have asked him to do twice. Primary dressing to the wound is silver collagen he has severe lymphedema likely secondary to chronic venous insufficiency. Wounds on the left lateral, left medial and left anterior and a small area on the right medial 12/2; the area on the right anterior lower leg has healed. We initially thought that the area medially had healed as well however when her discharge nurse came in she detected fluid in the wound simply opened up. This is actually worse than I remember this pain. The area on the left lateral potentially slightly smaller He is also complaining about pain in his left hand he says that this is actually been getting some better he has been using topical creams on this. She asked that I look at this 12/9 after last weeks issues we have 2 wounds one on the right medial lower leg and 1 on the left lateral. Both of these are in the same condition. I think because of thickened skin secondary to chronic lymphedema these wounds actually have depth of almost 0.8 cm. 12/16; the patient has 2 small but deep wounds one on the right medial and one on the left lateral. The right medial is actually the worst of these. He arrives in clinic today with absolutely terrible edema in the right leg apparently his 4-layer wrap fell down to just above his ankle he did not think about this he is apparently been continuing to use his compression pump twice a day. The left leg looks a lot better. 05/09/2020 upon evaluation today patient appears to be doing decently well in regard to his wounds. Everything is measuring smaller the right leg still has a little bit deeper wound in the left seems to be almost completely healed in my opinion I am very pleased in general with how things are progressing. He has a 4- layer compression  wrap we have been using endoform today we will probably have to use collagen just based on the fact that we do not have endoform it is on order. 1/6; the patient's wound on the left lateral lower leg has healed. Still has 1 on the right medial. 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 Using compression pumps at home twice a day. 1/13; left lateral lower leg is still healed. He has a deep punched out rectangular shaped wound on the right medial calf. Looking down at this it appears that he is attempting to epithelialize around the edges of the wound and on the base as well. His edema is reasonably well controlled we have been using collagen with absolutely no effect 1/20; left lateral lower leg remains closed he has extremitease stockings. The area on the right medial calf I aggressively debrided last week measures larger but the surface looks better. We have been using Hydrofera Blue. We ran Oasis through his insurance but we have not seen the results of this 1/27; left lower leg wound with chronic venous insufficiency and secondary lymphedema. I did aggressive debridement on this last week the wound seems to have come in  healthy looking surface using Hydrofera Blue. He was denied for Oasis 2/3; small divot in the right medial lower leg. Under illumination the walls of this divot are epithelialized however the base has slough which I removed with a curette we have been using Hydrofera Blue 2/10 small divot on the right medial lower leg pinpoint illumination at the base of this cone-shaped wound. We have been using Hydrofera Blue but I will switch to calcium alginate this week 2/17; the small divot on the right medial lower leg is fully epithelialized. There is no visible open area under illumination. He has his own stocking for the right leg similar to the one he has been wearing on the left. 03/26/2022; READMISSION This is a now 77 year old man that we had in the clinic from  02/23/2020 through 07/05/2020. At that point he had bilateral lower extremity wounds left greater than right in the setting of severe lymphedema. He had already obtained compression pumps ordered for him I think from the wound care clinic in [facility] so I do not really have record of what he has been using. He claims to be using them once a day but there is a problem with the sleeve on the left leg. About 2 weeks ago he was hospitalized from 03/11/2022 through 03/14/2022 with diastolic congestive heart failure. 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 He was diuresed. Developed some prerenal azotemia and he has not been taking any diuretics currently. He has not been putting stockings on his legs since he got out of hospital and still has his legs dependent for long periods. Past medical history history of prostate cancer treated with prostatectomy and radiation this was apparently about 8 years ago, history of DVT on chronic Coumadin, history of lymphedema was managed for a while at the clinic in [place]. History of inguinal hernia repair in September 22, hypertension, stage IIIb chronic renal failure ABIs today were noncompressible on the right 1.12 on the left 04-02-2022 upon evaluation today patient appears to be doing well currently in regard to his legs I do feel like both areas that are draining are actually much drier than they were in the picture last week although the left is drier than the right. He is tolerating the 4-layer compression wraps at this point he did contact the pump company and they are actually working on getting him a new compression sleeve for one of his legs which have previously popped and was not functioning properly. 04-23-2022 upon evaluation today patient appears to be doing well currently in regard to his wounds on the legs. I am actually very pleased with where things stand and I do feel like that we are  headed in the right direction. Fortunately there is no sign of active infection locally or systemically at this time. 05-07-2022 upon evaluation today patient appears to be doing well currently in regard to his wounds in fact things are showing signs of improvement which is good news I do not see too much that actually appears to be open and I am very pleased in that regard. No fevers, chills, nausea, vomiting, or diarrhea. 05-21-2022 upon evaluation today patient appears to be doing somewhat poorly in regard to drainage of his lower extremities bilaterally. The right is greater than left as far as the weeping area. Nonetheless it seems to be getting worse not better. He actually has pitting edema which is at least 2+ to the thighs and I  am concerned about the fact that he is may be fluid overloaded in general and that is the reason why we cannot get this under control. I know he is not using his pumps all the time because he actually told the nurse that he was either going to pump or he was going to use his fluid pills but not do both. For that reason I [name], [name] ([id number]) [phone].pdf Page 9 of 12 do think that he needs to be really doing both in order to get the fluid out as effectively as possible obviously with the 4-layer compression wraps were doing as much as we can from a compression standpoint but it is really not enough. He tells me that he elevates his leg is much as he can in between pumping and other activity throughout the day. 05-28-2022 upon evaluation today patient appears to be doing better in regard to his wounds although the measurements may be a little bit larger this is a very difficult wound to heal it is very indistinct in a lot of areas. Nonetheless there is can be some need for sharp debridement in regard to both medial and lateral legs. Fortunately I see no signs of active infection locally nor systemically at this time. No fevers, chills,  nausea, vomiting, or diarrhea. 06-04-2022 upon evaluation today patient appears to be doing poorly in general in regard to the wounds on his legs. He still continues to have a tremendous amount of fluid not just in the lower portion of his leg but to be honest his thigh where he has 2-3+ pitting edema in the thigh as well. Unfortunately I do not know that we will be able to get this healed effectively and keep it healed on the lower extremities unless he gets the overall fluid situation taking and under control. Fortunately I do not see any signs of infection locally nor systemically which is great news. He just seems to be very fluid overloaded. 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 He has been tolerating the dressing changes with the compression wraps without complication. Fortunately I do not see any evidence of infection at this time which is great news. No fevers, chills, nausea, vomiting, or diarrhea. 06-18-2022 upon evaluation today patient appears to be doing well currently in regard to his wounds as far as not looking like they are terribly infected but nonetheless I am concerned about a subacute infection secondary to the fact that he continues to have spreading despite the compression therapy. We actually did do an Unna boot on him last week this is actually the first wrap that actually stayed up everything else has been sliding down quite significantly. Fortunately there does not appear to be any signs of infection systemically at this time. With that being said I do believe that locally there seems to be an issue going on here and again I Ernie Hew do a PCR culture to see what that shows also think that I am going to put him on a broad-spectrum antibiotic, doxycycline to see how that will help as well. He does tell me that coming into the clinic today that he was feeling short  of breath like "he was about to have a heart attack" because he was having such a hard time breathing. He says that he told this to Dr. Jodelle Green his cardiologist as well when he was evaluated in the past 1 to 2 weeks. 07-02-2022 upon evaluation  today patient appears to be doing poorly currently in regard to his wound. He has been tolerating the dressing changes. Unfortunately he has not had any compression wraps on for the past week because he was unable to make it in for his appointment last week. With that being said he has a significant amount of drainage he tells me has been using his pumps but despite this in the pumps he still has been draining quite a bit. The drainage is also somewhat purulent unfortunately. We did attempt to get in touch with his cardiologist last week unfortunately we were unable to get up with him I did advise that the patient needs to get in touch with him upon leaving today in order to make sure they know he is on the new antibiotics I am going to send him this will be Levaquin and Augmentin. 07-09-2022 upon evaluation today patient appears to be doing about the same in regard to his legs he may have just a slight amount of improvement with regard to the drainage probably [place] topical antibiotics are helping in this regard to some degree. Fortunately there does not appear to be any signs of active infection systemically which is great news. No fevers, chills, nausea, vomiting, or diarrhea. 07-16-2022 upon evaluation today patient appears to be doing well currently in regard to his wound. He has been tolerating the dressing changes without complication. Fortunately there does not appear to be any signs of active infection locally nor systemically at this time. With that being said he cannot keep the wraps up he tells me on the left side he had to cut this down because it got too tight. He has been using his pumps but he is on the right side the wrap actually straight down  causing some pushing around the central part of his leg just below the calf I think this is a bigger risk for him that help at this point. I think that we may need to try something different he should be getting his compression socks shortly he tells me they were ordered last Thursday. 3/6; ; this is a patient who lives in [place]. He has severe bilateral lymphedema. He has compression pumps, we have been using kerlix Ace wrap [place]. He is changing the dressing. We do not have home health. 08-06-2022 upon evaluation today patient appears to be doing a little better in regard to his wounds in general at this point. Fortunately there does not appear to be any signs of active infection locally nor systemically at this time which is great news and overall I am extremely pleased with where we stand today. 08-13-2022 upon evaluation today patient appears to actually be doing significantly better compared to last week. He actually did go to the hospital I told him that he needed to when he left here and he actually did go. With that being said they actually ended up admitting him he was having shortness of breath and I thought it might be related to congestive heart failure turns out he actually had a pulmonary embolism. Subsequently they were able to get him off of the Coumadin switching over to Eliquis to get things stabilized in that regard they also had them wrapped and got his swelling under control on his legs he actually looks much better pretty much across the board at this point. I am very pleased in that regard. With that being said I am very happy that he finally went that could have been a very  dangerous situation. 08-20-2022 upon evaluation today patient appears to be doing well currently in regard to his wound. Has been tolerating the dressing changes without complication. Fortunately there does not appear to be any signs of active infection locally nor systemically at this time. I think his legs  are doing better there is some need for sharp debridement today. 08-27-2022 upon evaluation patient is actually making excellent progress. I am actually very pleased with where he stands and I think that he is moving in the right direction. In general I think that we are looking pretty good at the moment. 09-03-2022 upon evaluation today patient appears to be doing well currently in regard to his wound. He is actually tolerating dressing changes on the left and right leg without complication. Fortunately I do not see any need for debridement of the left leg the right leg I think we probably do some need to perform some debridement here. 09-10-2022 upon evaluation today patient's wounds actually showed signs of improvement in both legs I do not see much is going require debridement today which was great news. Fortunately I do not see any evidence of infection which I think is also excellent he seems to be using his pumps and doing everything right I am happy about how this is progressing at this point. 09-17-2022 upon evaluation today patient appears to be doing decently well in regard to his wounds. He has been tolerating the dressing changes without complication. Fortunately there does not appear to be any signs of active infection at this time which is good news. 09-24-2022 upon evaluation today patient appears to be doing well currently in regard to his wounds. He has been tolerating the dressing changes without complication. Fortunately there does not appear to be any signs of active infection locally nor systemically which is great news. No fevers, chills, nausea, vomiting, or diarrhea. 10-08-2022 upon evaluation today patient appears to be doing excellent currently in regard to his wound. He has been tolerating the dressing changes without complication though it does not sound like he has been using his compression wraps for a bit here. He does think he was doing better with the [facility]  topical antibiotics we can definitely go back to using that but I think the biggest issue here is that his swelling is just very out of control and needs to be under control. I discussed that with him today. 10-15-2022 upon evaluation today patient appears to be doing well currently in regard to his wounds. He is actually making some progress which is good news. Fortunately I do not see any evidence of active infection locally nor systemically which is great news as well. No fevers, chills, nausea, vomiting, or diarrhea. He does have a callused area on the plantar aspect of his left foot which is actually causing some pain and he wonders if I can trim this down for him. 10-22-2022 upon evaluation today patient seems to be making progress. He is actually doing quite well and very pleased in that regard. I do not see any signs of active infection at this time. 11-05-2022 upon evaluation patient appears to be making progress although slowly towards closure. He seems to be doing well with regard to his legs were still using the [facility] topical antibiotics and he seems to be doing quite well. He is going require some sharp debridement today. [name], [name] ([id number]) [id number] Page 10 of 12 11-19-2022 upon evaluation today patient unfortunately appears to be extremely swollen at this point. He tells  me that he ran out of supplies he also tells me his leg started leaking more because of not having supplies he was unable to wear his compression wraps that is the juxta fit compression wraps. Therefore his legs are extremely swollen much larger than normal and do not appear to be doing well at all today. He is going require some debridement I also think he is going require [place] to perform compression wrapping today. 11-26-2022 upon evaluation today patient appears to be doing well currently in regard to his legs as far as infection is concerned I see nothing that appears to  be infected. Fortunately I do not see any signs of active infection systemically either which is also good news. With that being said he still is extremely swollen as far as his legs are concerned. I do not see any signs of overall worsening but also do not see any signs of significant improvement which is the major issue here. 12-03-2022 upon evaluation today patient appears to be doing poorly still in regard to his legs the apparently has been taken off the wraps and not leaving the week by week. With that being said he has not had really the dressings to put on the release that is running out and subsequently though he is using his wraps I am not sure that he has been keeping them on like he needs to. I explained to him I really wanted to have the wraps that I put on him left on until he comes back to see me so that we can keep the compression under better control. He voiced understanding today he tells me that he was "confused about that". Nonetheless based on what I am seeing right now also think that he is up on his feet too much I think he needs to get the feet elevated. 7/24; this is a patient with severe bilateral stage III lymphedema. On the right lower leg medially is a large area of macerated denuded skin was too small deeper areas in this. On the left leg he is 2 areas that are a little more standard in terms of wounds. Massive lymphedema bilaterally. He has compression pumps at home which he uses for 1 hour twice a day 12-17-2022 upon evaluation today patient's legs though a little bit smaller still continue to have significant issues with weeping here this just does not dry. I really think he needs to be changing this daily and this means we will probably have to try to go back to the Velcro wraps and give this a shot. I think may be going to the Velcro wraps, changing the dressing to a superabsorber like XtraSorb or the like, and have him elevate his legs and do his pumps 3 times per day  is probably going to be the way to go to try to see if we can make some improvement here. Objective Constitutional Chronically ill appearing but in no apparent acute distress. Vitals Time Taken: 2:19 PM, Height: 74 in, Weight: 250 lbs, BMI: 32.1, Temperature: 97.7 F, Pulse: 75 bpm, Respiratory Rate: 18 breaths/min, Blood Pressure: 151/56 mmHg. Respiratory normal breathing without difficulty. Psychiatric this patient is able to make decisions and demonstrates good insight into disease process. Alert and Oriented x 3. pleasant and cooperative. General Notes: Patient's leg again is showing signs of a tremendous amount of weeping. Unfortunately he just does not seem to be making the progress that we need to see. I am trying to do what we can get  this under control as quickly as possible. Integumentary (Hair, Skin) Wound #11 status is Open. Original cause of wound was Gradually Appeared. The date acquired was: 10/19/2022. The wound has been in treatment 8 weeks. The wound is located on the Left,Posterior Lower Leg. The wound measures 0.8cm length x 1cm width x 0.1cm depth; 0.628cm^2 area and 0.063cm^3 volume. There is Fat Layer (Subcutaneous Tissue) exposed. There is no tunneling or undermining noted. There is a large amount of serosanguineous drainage noted. The wound margin is distinct with the outline attached to the wound base. There is medium (34-66%) pink granulation within the wound bed. There is a medium (34-66%) amount of necrotic tissue within the wound bed including Adherent Slough. The periwound skin appearance had no abnormalities noted for texture. The periwound skin appearance had no abnormalities noted for color. The periwound skin appearance did not exhibit: Dry/Scaly, Maceration. Periwound temperature was noted as No Abnormality. Wound #12 status is Open. Original cause of wound was Frostbite. The date acquired was: 12/17/2022. The wound is located on the Left,Anterior Lower Leg.  The wound measures 3cm length x 1.5cm width x 0.2cm depth; 3.534cm^2 area and 0.707cm^3 volume. There is Fat Layer (Subcutaneous Tissue) exposed. There is no tunneling or undermining noted. There is a large amount of serous drainage noted. The wound margin is distinct with the outline attached to the wound base. There is no granulation within the wound bed. There is a large (67-100%) amount of necrotic tissue within the wound bed including Adherent Slough. The periwound skin appearance did not exhibit: Callus, Crepitus, Excoriation, Induration, Rash, Scarring, Dry/Scaly, Maceration, Atrophie Blanche, Cyanosis, Ecchymosis, Hemosiderin Staining, Mottled, Pallor, Rubor, Erythema. Wound #6 status is Open. Original cause of wound was Gradually Appeared. The date acquired was: 03/05/2022. The wound has been in treatment 38 weeks. The wound is located on the Right,Medial Lower Leg. The wound measures 15cm length x 27cm width x 0.5cm depth; 318.086cm^2 area and 159.043cm^3 volume. There is Fat Layer (Subcutaneous Tissue) exposed. There is no tunneling or undermining noted. There is a large amount of serous drainage noted. The wound margin is distinct with the outline attached to the wound base. There is no granulation within the wound bed. There is a large (67-100%) amount of necrotic tissue within the wound bed including Adherent Slough. The periwound skin appearance exhibited: Scarring, Dry/Scaly, Hemosiderin Staining. The periwound skin appearance did not exhibit: Callus, Crepitus, Excoriation, Induration, Rash, Maceration, Atrophie Blanche, Cyanosis, Ecchymosis, Mottled, Pallor, Rubor, Erythema. Periwound temperature was noted as No Abnormality. Wound #7 status is Open. Original cause of wound was Gradually Appeared. The date acquired was: 03/05/2022. The wound has been in treatment 38 weeks. The wound is located on the Left,Medial Lower Leg. The wound measures 2.5cm length x 3cm width x 0.3cm depth;  5.89cm^2 area and 1.767cm^3 volume. There is Fat Layer (Subcutaneous Tissue) exposed. There is no tunneling or undermining noted. There is a large amount of serous drainage noted. The wound margin is distinct with the outline attached to the wound base. There is small (1-33%) red granulation within the wound bed. There is a large (67-100%) amount of necrotic tissue within the wound bed including Adherent Slough. The periwound skin appearance exhibited: Scarring, Maceration, Hemosiderin Staining. The periwound skin appearance did not exhibit: Callus, Crepitus, Excoriation, Induration, Rash, Dry/Scaly, Atrophie Blanche, Cyanosis, Ecchymosis, Mottled, Pallor, Rubor, Erythema. Periwound temperature was noted as No Abnormality. 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 There was a Chemical/Enzymatic/Mechanical debridement performed by [name], PA.. Other agent used was gauze and wound cleanser. A Moderate amount of bleeding was controlled with Pressure. The procedure was tolerated well. Post Debridement Measurements: 3cm length x 1.5cm width x 0.2cm depth; 0.707cm^3 volume. Character of Wound/Ulcer Post Debridement requires further debridement. Post procedure Diagnosis Wound #12: Same as Pre-Procedure Plan Follow-up Appointments: Return Appointment in 1 week. [name] PA-C Wednesday 12/24/2022 at 315pm (already has an appt) Return Appointment in 2 weeks. Leonard Schwartz Wednesday  3pm 12/31/2022 ****extra time 60 minutes**** room 9 Other: - Please ask your Primary Care Practioner or Foot Doctor to order orthotic shoes. Anesthetic: (In clinic) Topical Lidocaine 5% applied to wound bed Bathing/ Shower/ Hygiene: May shower and wash wound with soap and water. - apply clean dressing after bathing. Do not soak legs in tub, may rinse in shower Edema Control - Lymphedema / SCD / Other: Lymphedema Pumps. Use Lymphedema pumps on leg(s) 2-3 times a day for 45-60 minutes. If wearing any wraps or hose, do not remove them. Continue exercising as instructed. - *****pump 3 times a day for an hour each time.***** Elevate legs to the level of the heart or above for 30 minutes daily and/or when sitting for 3-4 times a day throughout the day. - +++ Important+++ ELEVATE THE LEGS>>>>>>>>>> Avoid standing for long periods of time. Exercise regularly Compression stocking or Garment 30-40 mm/Hg pressure to: - Juxtafit Essentials Long for bilateral lower legs go to . apply in the morning and remove at night. Other Edema Control Orders/Instructions: - When sitting please keep your feet up, keep legs at Heart Level or above. Use pillows behind calf to for comfort. Off-Loading: Other: - When sitting please keep your feet up, keep legs at Heart Level or above. Use pillows behind calf to for comfort. Additional Orders / Instructions: Follow Nutritious Diet - Try and increase protein intake. Goal of protein intake is 60g-100g. WOUND #11: - Lower Leg Wound Laterality: Left, Posterior Cleanser: Soap and Water 1 x Per Day/30 Days Discharge Instructions: May shower and wash wound with dial antibacterial soap and water prior to dressing change. Peri-Wound Care: Sween Lotion (Moisturizing lotion) 1 x Per Day/30 Days Discharge Instructions: Apply moisturizing lotion as directed Secondary Dressing: OptiLock Super Absorbent, 5x5.5 (in/in) (Generic) 1 x Per Day/30 Days Discharge Instructions: Apply  directly to wound bed as directed Secured With: Kerlix Roll Sterile, 4.5x3.1 (in/yd) 1 x Per Day/30 Days Discharge Instructions: Secure with Kerlix as directed. Secured With: 58M Medipore H Soft Cloth Surgical T ape, 4 x 10 (in/yd) 1 x Per Day/30 Days Discharge Instructions: Secure with tape as directed. WOUND #12: - Lower Leg Wound Laterality: Left, Anterior Cleanser: Soap and Water 1 x Per Day/30 Days Discharge Instructions: May shower and wash wound with dial antibacterial soap and water prior to dressing change. Peri-Wound Care: Sween Lotion (Moisturizing lotion) 1 x Per Day/30 Days Discharge Instructions: Apply moisturizing lotion as directed Secondary Dressing: OptiLock Super Absorbent, 5x5.5 (in/in) (Generic) 1 x Per Day/30 Days Discharge Instructions: Apply directly to wound bed as directed Secured With: Kerlix Roll Sterile, 4.5x3.1 (in/yd) 1 x Per  Day/30 Days Discharge Instructions: Secure with Kerlix as directed. Secured With: 49M Medipore H Soft Cloth Surgical T ape, 4 x 10 (in/yd) 1 x Per Day/30 Days Discharge Instructions: Secure with tape as directed. WOUND #6: - Lower Leg Wound Laterality: Right, Medial Cleanser: Soap and Water 1 x Per Day/30 Days Discharge Instructions: May shower and wash wound with dial antibacterial soap and water prior to dressing change. Peri-Wound Care: Sween Lotion (Moisturizing lotion) 1 x Per Day/30 Days Discharge Instructions: Apply moisturizing lotion as directed Secondary Dressing: OptiLock Super Absorbent, 5x5.5 (in/in) (Generic) 1 x Per Day/30 Days Discharge Instructions: Apply directly to wound bed as directed Secured With: Kerlix Roll Sterile, 4.5x3.1 (in/yd) 1 x Per Day/30 Days Discharge Instructions: Secure with Kerlix as directed. Secured With: 49M Medipore H Soft Cloth Surgical T ape, 4 x 10 (in/yd) 1 x Per Day/30 Days Discharge Instructions: Secure with tape as directed. WOUND #7: - Lower Leg Wound Laterality: Left, Medial Cleanser: Soap  and Water 1 x Per Day/30 Days Discharge Instructions: May shower and wash wound with dial antibacterial soap and water prior to dressing change. Peri-Wound Care: Sween Lotion (Moisturizing lotion) 1 x Per Day/30 Days Discharge Instructions: Apply moisturizing lotion as directed [name], [name] ([id number]) [id number] Page 12 of 12 Secondary Dressing: OptiLock Super Absorbent, 5x5.5 (in/in) (Generic) 1 x Per Day/30 Days Discharge Instructions: Apply directly to wound bed as directed Secured With: Kerlix Roll Sterile, 4.5x3.1 (in/yd) 1 x Per Day/30 Days Discharge Instructions: Secure with Kerlix as directed. Secured With: 49M Medipore H Soft Cloth Surgical T ape, 4 x 10 (in/yd) 1 x Per Day/30 Days Discharge Instructions: Secure with tape as directed. 1. I would recommend currently that we have the patient go back to using his Velcro wraps for the time being I want him to change his dressing daily we will going to see about ordering XtraSorb due to the sheer amount of drainage that he is experiencing at this point. 2. I am good recommend the patient needs to be using his lymphedema pumps 3 times per day. He also needs to be elevating his legs frequently. I know this is a lot of work but I think this can be necessary to try to get things under control otherwise I think he is getting up in the hospital at some point with this issue. We will see patient back for reevaluation in 1 week here in the clinic. If anything worsens or changes patient will contact our office for additional recommendations. Electronic Signature(s) Signed: 12/17/2022 4:35:55 PM By: [name] PA-C Entered By: [name] on 12/17/2022 16:35:55 -------------------------------------------------------------------------------- SuperBill Details Patient Name: Date of Service: [name], [name] 12/17/2022 Medical Record Number: [id number] Patient Account Number: [id number] Date of Birth/Sex: Treating  RN: 08-08-1945 (77 y.o. [name], [name] Primary Care Provider: [name] Other Clinician: Referring Provider: Treating Provider/Extender: [name] Weeks in Treatment: 38 Diagnosis Coding ICD-10 Codes Code Description [phone] Chronic venous hypertension (idiopathic) with ulcer and inflammation of bilateral lower extremity I89.0 Lymphedema, not elsewhere classified 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 other part of right lower leg with other specified severity L84 Corns and callosities Facility Procedures : CPT4 Code: [id number] Description: 38756 - WOUND CARE VISIT-LEV 5 EST PT Modifier: Quantity: 1 Physician Procedures : CPT4 Code Description Modifier [id number] - WC PHYS LEVEL 3 - EST PT ICD-10 Diagnosis Description I87.333 Chronic venous hypertension (idiopathic) with  ulcer and inflammation of bilateral lower extremity I89.0 Lymphedema, not elsewhere classified  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 other part of right lower leg with other specified severity Quantity: 1 Electronic Signature(s) Signed: 12/17/2022 4:36:08 PM By: [name] PA-C Entered By: [name] on 12/17/2022 16:36:07

## 2022-12-19 NOTE — Progress Notes (Signed)
Gunther, [name] ([id number]) [id number] Page 1 of 12 Visit Report for 12/17/2022 Arrival Information Details Patient Name: Date of Service: Mcdonnell, [name] 12/17/2022 2:00 PM Medical Record Number: [id number] Patient Account Number: [id number] Date of Birth/Sex: Treating RN: Aug 24, 1945 (77 y.o. M) Primary Care : [name] Other Clinician: Referring : Treating /Extender: [name] in Treatment: 39 Visit Information History Since Last Visit Added or deleted any medications: No Patient Arrived: Cane Any new allergies or adverse reactions: No Arrival Time: 13:58 Had a fall or experienced change in No Accompanied By: self activities of daily living that may affect Transfer Assistance: None risk of falls: Patient Identification Verified: Yes Signs or symptoms of abuse/neglect since last visito No Secondary Verification Process Completed: Yes Hospitalized since last visit: No Patient Requires Transmission-Based Precautions: No Implantable device outside of the clinic excluding No Patient Has Alerts: Yes cellular tissue based products placed in the center Patient Alerts: Patient on Blood Thinner since last visit: Right ABI in clinic [place] Has Dressing in Place as Prescribed: Yes Has Compression in Place as Prescribed: Yes Pain Present Now: Yes Electronic Signature(s) Signed: 12/19/2022 11:53:17 AM By: Thayer Dallas Entered By: Thayer Dallas on 12/17/2022 14:05:18 -------------------------------------------------------------------------------- Clinic Level of Care Assessment Details Patient Name: Date of Service: Kelleher, [name] 12/17/2022 2:00 PM Medical Record Number: [id number] Patient Account Number: [id number] Date of Birth/Sex: Treating RN: September 13, 1945 (77 y.o. [name], [name] Primary Care : [name] Other Clinician: Referring : Treating /Extender: [name] in Treatment: 20 Clinic Level of Care Assessment Items TOOL 4 Quantity Score X- 1 0 Use when only an EandM is performed on FOLLOW-UP visit ASSESSMENTS - Nursing Assessment / Reassessment X- 1 10 Reassessment of Co-morbidities (includes updates in patient status) X- 1 5 Reassessment of Adherence to Treatment Plan ASSESSMENTS - Wound and Skin A ssessment / Reassessment []  - 0 Simple Wound Assessment / Reassessment - one wound X- 4 5 Complex Wound Assessment / Reassessment - multiple wounds []  - 0 Dermatologic / Skin Assessment (not related to wound area) ASSESSMENTS - Focused Assessment X- 2 5 Circumferential Edema Measurements - multi extremities []  - 0 Nutritional Assessment / Counseling / Intervention Sagona, [name] ([id number]) [id number] Page 2 of 12 []  - 0 Lower Extremity Assessment (monofilament, tuning fork, pulses) []  - 0 Peripheral Arterial Disease Assessment (using hand held doppler) ASSESSMENTS - Ostomy and/or Continence Assessment and Care []  - 0 Incontinence Assessment and Management []  - 0 Ostomy Care Assessment and Management (repouching, etc.) PROCESS - Coordination of Care []  - 0 Simple Patient / Family Education for ongoing care X- 1 20 Complex (extensive) Patient / Family Education for ongoing care X- 1 10 Staff obtains Chiropractor, Records, T Results / Process Orders est []  - 0 Staff telephones HHA, Nursing Homes / Clarify orders / etc []  - 0 Routine Transfer to another Facility (non-emergent condition) []  - 0 Routine Hospital Admission (non-emergent condition) []  - 0 New Admissions / Manufacturing engineer / Ordering NPWT Apligraf, etc. , []  - 0 Emergency Hospital Admission (emergent condition) []  - 0 Simple Discharge Coordination X- 1 15 Complex (extensive) Discharge Coordination PROCESS - Special Needs []  - 0 Pediatric / Minor Patient Management []  - 0 Isolation Patient  Management []  - 0 Hearing / Language / Visual special needs []  - 0 Assessment of Community assistance (transportation, D/C planning, etc.) []  - 0 Additional assistance / Altered mentation []  - 0 Support Surface(s) Assessment (bed,  cushion, seat, etc.) INTERVENTIONS - Wound Cleansing / Measurement []  - 0 Simple Wound Cleansing - one wound X- 4 5 Complex Wound Cleansing - multiple wounds X- 1 5 Wound Imaging (photographs - any number of wounds) []  - 0 Wound Tracing (instead of photographs) []  - 0 Simple Wound Measurement - one wound X- 4 5 Complex Wound Measurement - multiple wounds INTERVENTIONS - Wound Dressings []  - 0 Small Wound Dressing one or multiple wounds []  - 0 Medium Wound Dressing one or multiple wounds X- 2 20 Large Wound Dressing one or multiple wounds []  - 0 Application of Medications - topical []  - 0 Application of Medications - injection INTERVENTIONS - Miscellaneous []  - 0 External ear exam []  - 0 Specimen Collection (cultures, biopsies, blood, body fluids, etc.) []  - 0 Specimen(s) / Culture(s) sent or taken to Lab for analysis []  - 0 Patient Transfer (multiple staff / Nurse, adult / Similar devices) []  - 0 Simple Staple / Suture removal (25 or less) []  - 0 Complex Staple / Suture removal (26 or more) []  - 0 Hypo / Hyperglycemic Management (close monitor of Blood Glucose) Throne, Joquan ([id number]) [id number] Page 3 of 12 []  - 0 Ankle / Brachial Index (ABI) - do not check if billed separately X- 1 5 Vital Signs Has the patient been seen at the hospital within the last three years: Yes Total Score: 180 Level Of Care: New/Established - Level 5 Electronic Signature(s) Signed: 12/17/2022 5:15:20 PM By: [name] RN, [name] Entered By: [name] on 12/17/2022 14:53:44 -------------------------------------------------------------------------------- Encounter Discharge Information Details Patient Name: Date of  Service: Berwick, [name] 12/17/2022 2:00 PM Medical Record Number: [id number] Patient Account Number: [id number] Date of Birth/Sex: Treating RN: 03/08/1946 (77 y.o. [name] Primary Care : [name] Other Clinician: Referring : Treating /Extender: [name] Weeks in Treatment: 34 Encounter Discharge Information Items Post Procedure Vitals Discharge Condition: Stable Temperature (F): 97.7 Ambulatory Status: Cane Pulse (bpm): 75 Discharge Destination: Home Respiratory Rate (breaths/min): 20 Transportation: Private Auto Blood Pressure (mmHg): 151/56 Accompanied By: self Schedule Follow-up Appointment: Yes Clinical Summary of Care: Electronic Signature(s) Signed: 12/17/2022 5:15:20 PM By: [name] RN, [name] Entered By: [name] on 12/17/2022 15:03:09 -------------------------------------------------------------------------------- Lower Extremity Assessment Details Patient Name: Date of Service: Cothron, [name] 12/17/2022 2:00 PM Medical Record Number: [id number] Patient Account Number: [id number] Date of Birth/Sex: Treating RN: [birth year] (77 y.o. M) Primary Care : [name] Other Clinician: Referring : Treating /Extender: Leveda Anna, [name] Weeks in Treatment: 38 Edema Assessment Assessed: [Left: No] [Right: No] Edema: [Left: Yes] [Right: Yes] Calf Left: Right: Point of Measurement: 40 cm From Medial Instep 54.5 cm 63.5 cm Ankle Left: Right: Point of Measurement: 13 cm From Medial Instep 39 cm 40 cm Vascular Assessment Left: [id number] Page 4 of 12Right:] Extremity colors, hair growth, and conditions: Extremity Color: [phone].pdf Page 4 of 12Normal Normal] Hair Growth on Extremity: [phone].pdf Page 4 of 12No] Temperature of Extremity: [phone].pdf Page 4 of  12Warm Warm > 3 seconds > 3 seconds] Toe Nail Assessment Left: Right: Thick: Yes Yes Discolored: Yes Yes Deformed: Yes Yes Improper Length and Hygiene: Yes Yes Electronic Signature(s) Signed: 12/19/2022 11:53:17 AM By: Thayer Dallas Entered By: Thayer Dallas on 12/17/2022 14:17:18 -------------------------------------------------------------------------------- Multi-Disciplinary Care Plan Details Patient Name: Date of Service: Mewborn, [name] 12/17/2022 2:00 PM Medical Record Number: [id number] Patient Account Number: [id number] Date of Birth/Sex: Treating RN: 1945-07-14 (77 y.o. [name], [name] Primary  Care : [name] Other Clinician: Referring : Treating /Extender: [name] in Treatment: 63 Active Inactive Wound/Skin Impairment Nursing Diagnoses: Impaired tissue integrity Knowledge deficit related to ulceration/compromised skin integrity Goals: Patient will have a decrease in wound volume by X% from date: (specify in notes) Date Initiated: 03/26/2022 Target Resolution Date: 01/17/2023 Goal Status: Active Patient/caregiver will verbalize understanding of skin care regimen Date Initiated: 03/26/2022 Target Resolution Date: 01/17/2023 Goal Status: Active Ulcer/skin breakdown will have a volume reduction of 30% by week 4 Date Initiated: 03/26/2022 Date Inactivated: 05/21/2022 Target Resolution Date: 05/17/2022 Unmet Reason: see wound Goal Status: Unmet measurement. Ulcer/skin breakdown will have a volume reduction of 50% by week 8 Date Initiated: 03/26/2022 Date Inactivated: 05/21/2022 Target Resolution Date: 05/17/2022 Unmet Reason: see wound Goal Status: Unmet measurement. Interventions: Assess patient/caregiver ability to obtain necessary supplies Assess patient/caregiver ability to perform ulcer/skin care regimen upon admission and as needed Assess ulceration(s) every visit Notes: Patient stated today, "I will  take my fluid pill or pump not do both."  made aware. Electronic Signature(s) Signed: 12/17/2022 5:15:20 PM By: [name] RN, [name] Entered By: [name] on 12/17/2022 14:43:19 Febo, [name] [id number] Page 5 of 12 -------------------------------------------------------------------------------- Pain Assessment Details Patient Name: Date of Service: Hustead, [name] 12/17/2022 2:00 PM Medical Record Number: [id number] Patient Account Number: [id number] Date of Birth/Sex: Treating RN: Dec 21, 1945 (77 y.o. M) Primary Care : [name] Other Clinician: Referring : Treating /Extender: [name] in Treatment: 72 Active Problems Location of Pain Severity and Description of Pain Patient Has Paino Yes Site Locations Pain Location: Pain in Ulcers Rate the pain. Current Pain Level: 7 Pain Management and Medication Current Pain Management: Electronic Signature(s) Signed: 12/19/2022 11:53:17 AM By: Thayer Dallas Entered By: Thayer Dallas on 12/17/2022 14:05:37 -------------------------------------------------------------------------------- Patient/Caregiver Education Details Patient Name: Date of Service: Dahmen, [name] 7/31/2024andnbsp2:00 PM Medical Record Number: [id number] Patient Account Number: [id number] Date of Birth/Gender: Treating RN: 02-Jul-1945 (77 y.o. [name] Primary Care Physician: [name] Other Clinician: Referring Physician: Treating Physician/Extender: [name] in Treatment: 18 Education Assessment Education Provided To: Patient Education Topics Provided Wound/Skin Impairment: Handouts: Caring for Your Ulcer Methods: Explain/Verbal Responses: Reinforcements needed Gruetzmacher, [name] ([id number]) [id number] Page 6 of 12 Electronic Signature(s) Signed: 12/17/2022 5:15:20 PM By: [name] RN, [name] Entered By: [name] on 12/17/2022 14:43:35 -------------------------------------------------------------------------------- Wound Assessment Details Patient Name: Date of Service: Kenneth, [name] 12/17/2022 2:00 PM Medical Record Number: [id number] Patient Account Number: [id number] Date of Birth/Sex: Treating RN: December 23, 1945 (77 y.o. M) Primary Care : [name] Other Clinician: Referring : Treating /Extender: Leveda Anna, [name] Weeks in Treatment: 38 Wound Status Wound Number: 11 Primary Lymphedema Etiology: Wound Location: Left, Posterior Lower Leg Wound Open Wounding Event: Gradually Appeared Status: Date Acquired: 10/19/2022 Comorbid Anemia, Lymphedema, Deep Vein Thrombosis, Hypertension, Weeks Of Treatment: 8 History: Received Radiation Clustered Wound: No Photos Wound Measurements Length: (cm) 0.8 Width: (cm) 1 Depth: (cm) 0.1 Area: (cm) 0.628 Volume: (cm) 0.063 % Reduction in Area: 94.9% % Reduction in Volume: 94.9% Epithelialization: Medium (34-66%) Tunneling: No Undermining: No Wound Description Classification: Full Thickness Without Exposed Suppor Wound Margin: Distinct, outline attached Exudate Amount: Large Exudate Type: Serosanguineous Exudate Color: red, brown t Structures Foul Odor After Cleansing: No Slough/Fibrino Yes Wound Bed Granulation Amount: Medium (34-66%) Exposed Structure Granulation Quality: Pink Fascia Exposed: No Necrotic Amount: Medium (34-66%) Fat  Layer (Subcutaneous Tissue) Exposed: Yes Necrotic Quality: Adherent Slough Tendon Exposed: No Muscle Exposed: No Joint Exposed: No Bone Exposed: No Periwound Skin Texture Texture Color No Abnormalities Noted: Yes No Abnormalities Noted: Yes Moisture Temperature / Pain No Abnormalities Noted: No Temperature: No Abnormality Balingit, Waylin ([id number]) [id number] Page 7 of 12 Dry / Scaly:  No Maceration: No Treatment Notes Wound #11 (Lower Leg) Wound Laterality: Left, Posterior Cleanser Soap and Water Discharge Instruction: May shower and wash wound with dial antibacterial soap and water prior to dressing change. Peri-Wound Care Sween Lotion (Moisturizing lotion) Discharge Instruction: Apply moisturizing lotion as directed Topical Primary Dressing Secondary Dressing OptiLock Super Absorbent, 5x5.5 (in/in) Discharge Instruction: Apply directly to wound bed as directed Secured With Kerlix Roll Sterile, 4.5x3.1 (in/yd) Discharge Instruction: Secure with Kerlix as directed. 41M Medipore H Soft Cloth Surgical T ape, 4 x 10 (in/yd) Discharge Instruction: Secure with tape as directed. Compression Wrap Compression Stockings Add-Ons Electronic Signature(s) Signed: 12/17/2022 5:15:20 PM By: [name] RN, [name] Entered By: [name] on 12/17/2022 14:50:10 -------------------------------------------------------------------------------- Wound Assessment Details Patient Name: Date of Service: Birky, [name] 12/17/2022 2:00 PM Medical Record Number: [id number] Patient Account Number: [id number] Date of Birth/Sex: Treating RN: 25-Apr-1946 (77 y.o. M) Primary Care : [name] Other Clinician: Referring : Treating /Extender: Leveda Anna, [name] Weeks in Treatment: 38 Wound Status Wound Number: 12 Primary Lymphedema Etiology: Wound Location: Left, Anterior Lower Leg Wound Open Wounding Event: Frostbite Status: Date Acquired: 12/17/2022 Comorbid Anemia, Lymphedema, Deep Vein Thrombosis, Hypertension, Weeks Of Treatment: 0 History: Received Radiation Clustered Wound: No Photos Mcnay, [name] ([id number]) [id number] Page 8 of 12 Wound Measurements Length: (cm) 3 Width: (cm) 1.5 Depth: (cm) 0.2 Area: (cm) 3.534 Volume: (cm) 0.707 % Reduction in Area: % Reduction in Volume: Epithelialization:  None Tunneling: No Undermining: No Wound Description Classification: Full Thickness Without Exposed Suppor Wound Margin: Distinct, outline attached Exudate Amount: Large Exudate Type: Serous Exudate Color: amber t Structures Foul Odor After Cleansing: No Slough/Fibrino Yes Wound Bed Granulation Amount: None Present (0%) Exposed Structure Necrotic Amount: Large (67-100%) Fascia Exposed: No Necrotic Quality: Adherent Slough Fat Layer (Subcutaneous Tissue) Exposed: Yes Tendon Exposed: No Muscle Exposed: No Joint Exposed: No Bone Exposed: No Periwound Skin Texture Texture Color No Abnormalities Noted: No No Abnormalities Noted: No Callus: No Atrophie Blanche: No Crepitus: No Cyanosis: No Excoriation: No Ecchymosis: No Induration: No Erythema: No Rash: No Hemosiderin Staining: No Scarring: No Mottled: No Pallor: No Moisture Rubor: No No Abnormalities Noted: No Dry / Scaly: No Maceration: No Treatment Notes Wound #12 (Lower Leg) Wound Laterality: Left, Anterior Cleanser Soap and Water Discharge Instruction: May shower and wash wound with dial antibacterial soap and water prior to dressing change. Peri-Wound Care Sween Lotion (Moisturizing lotion) Discharge Instruction: Apply moisturizing lotion as directed Topical Primary Dressing Secondary Dressing OptiLock Super Absorbent, 5x5.5 (in/in) Discharge Instruction: Apply directly to wound bed as directed Secured With Kerlix Roll Sterile, 4.5x3.1 (in/yd) Discharge Instruction: Secure with Kerlix as directed. 41M Medipore H Soft Cloth Surgical Tape, 4 x 10 (in/yd) Koon, Jiyaan ([id number]) [id number] Page 9 of 12 Discharge Instruction: Secure with tape as directed. Compression Wrap Compression Stockings Add-Ons Electronic Signature(s) Signed: 12/17/2022 5:15:20 PM By: [name] RN, [name] Entered By: [name] on 12/17/2022  14:50:29 -------------------------------------------------------------------------------- Wound Assessment Details Patient Name: Date of Service: Hartt, [name] 12/17/2022 2:00 PM Medical Record Number: [id number] Patient Account Number: [id number] Date of Birth/Sex: Treating RN: [birth year] (77 y.o. M) Primary Care : Algie Coffer,  [name] Other Clinician: Referring : Treating /Extender: Leveda Anna, [name] Weeks in Treatment: 38 Wound Status Wound Number: 6 Primary Lymphedema Etiology: Wound Location: Right, Medial Lower Leg Wound Open Wounding Event: Gradually Appeared Status: Date Acquired: 03/05/2022 Comorbid Anemia, Lymphedema, Deep Vein Thrombosis, Hypertension, Weeks Of Treatment: 38 History: Received Radiation Clustered Wound: Yes Photos Wound Measurements Length: (cm) Width: (cm) Depth: (cm) Clustered Quantity: Area: (cm) Volume: (cm) 15 % Reduction in Area: -122.5% 27 % Reduction in Volume: -1012.7% 0.5 Epithelialization: None 1 Tunneling: No 318.086 Undermining: No 159.043 Wound Description Classification: Full Thickness Without Exposed Sup Wound Margin: Distinct, outline attached Exudate Amount: Large Exudate Type: Serous Exudate Color: amber port Structures Foul Odor After Cleansing: No Slough/Fibrino Yes Wound Bed Granulation Amount: None Present (0%) Exposed Structure Necrotic Amount: Large (67-100%) Fascia Exposed: No Necrotic Quality: Adherent Slough Fat Layer (Subcutaneous Tissue) Exposed: Yes Tendon Exposed: No Muscle Exposed: No Joint Exposed: No Bone Exposed: No Teems, Isaiha ([id number]) [id number] Page 10 of 12 Periwound Skin Texture Texture Color No Abnormalities Noted: No No Abnormalities Noted: No Callus: No Atrophie Blanche: No Crepitus: No Cyanosis: No Excoriation: No Ecchymosis: No Induration: No Erythema: No Rash: No Hemosiderin Staining: Yes Scarring:  Yes Mottled: No Pallor: No Moisture Rubor: No No Abnormalities Noted: No Dry / Scaly: Yes Temperature / Pain Maceration: No Temperature: No Abnormality Treatment Notes Wound #6 (Lower Leg) Wound Laterality: Right, Medial Cleanser Soap and Water Discharge Instruction: May shower and wash wound with dial antibacterial soap and water prior to dressing change. Peri-Wound Care Sween Lotion (Moisturizing lotion) Discharge Instruction: Apply moisturizing lotion as directed Topical Primary Dressing Secondary Dressing OptiLock Super Absorbent, 5x5.5 (in/in) Discharge Instruction: Apply directly to wound bed as directed Secured With Kerlix Roll Sterile, 4.5x3.1 (in/yd) Discharge Instruction: Secure with Kerlix as directed. 3M Medipore H Soft Cloth Surgical T ape, 4 x 10 (in/yd) Discharge Instruction: Secure with tape as directed. Compression Wrap Compression Stockings Add-Ons Electronic Signature(s) Signed: 12/17/2022 5:15:20 PM By: [name] RN, [name] Entered By: [name] on 12/17/2022 14:50:48 -------------------------------------------------------------------------------- Wound Assessment Details Patient Name: Date of Service: Ngo, [name] 12/17/2022 2:00 PM Medical Record Number: [id number] Patient Account Number: [id number] Date of Birth/Sex: Treating RN: [birth year] (77 y.o. M) Primary Care : [name] Other Clinician: Referring : Treating /Extender: Leveda Anna, [name] Weeks in Treatment: 38 Wound Status Wound Number: 7 Primary Lymphedema Etiology: Wound Location: Left, Medial Lower Leg Wound Open Wounding Event: Gradually Appeared Status: Date Acquired: 03/05/2022 Comorbid Anemia, Lymphedema, Deep Vein Thrombosis, Hypertension, Weeks Of Treatment: 38 History: Received Radiation Clustered Wound: Yes Pompey, Karmelo ([id number]) [id number] Page 11 of 12 Photos Wound Measurements Length:  (cm) Width: (cm) Depth: (cm) Clustered Quantity: Area: (cm) Volume: (cm) 2.5 % Reduction in Area: 87.5% 3 % Reduction in Volume: 62.5% 0.3 Epithelialization: None 1 Tunneling: No 5.89 Undermining: No 1.767 Wound Description Classification: Full Thickness Without Exposed Sup Wound Margin: Distinct, outline attached Exudate Amount: Large Exudate Type: Serous Exudate Color: amber port Structures Foul Odor After Cleansing: No Slough/Fibrino Yes Wound Bed Granulation Amount: Small (1-33%) Exposed Structure Granulation Quality: Red Fascia Exposed: No Necrotic Amount: Large (67-100%) Fat Layer (Subcutaneous Tissue) Exposed: Yes Necrotic Quality: Adherent Slough Tendon Exposed: No Muscle Exposed: No Joint Exposed: No Bone Exposed: No Periwound Skin Texture Texture Color No Abnormalities Noted: No No Abnormalities Noted: No Callus: No Atrophie Blanche: No Crepitus: No Cyanosis: No Excoriation: No Ecchymosis: No Induration: No Erythema: No Rash: No Hemosiderin Staining: Yes  Scarring: Yes Mottled: No Pallor: No Moisture Rubor: No No Abnormalities Noted: No Dry / Scaly: No Temperature / Pain Maceration: Yes Temperature: No Abnormality Treatment Notes Wound #7 (Lower Leg) Wound Laterality: Left, Medial Cleanser Soap and Water Discharge Instruction: May shower and wash wound with dial antibacterial soap and water prior to dressing change. Peri-Wound Care Sween Lotion (Moisturizing lotion) Discharge Instruction: Apply moisturizing lotion as directed Topical Primary Dressing Secondary Dressing OptiLock Super Absorbent, 5x5.5 (in/in) Discharge Instruction: Apply directly to wound bed as directed Secured With Kerlix Roll Sterile, 4.5x3.1 (in/yd) Cobbins, Hezzie ([id number]) [id number] Page 12 of 12 Discharge Instruction: Secure with Kerlix as directed. 5M Medipore H Soft Cloth Surgical T ape, 4 x 10 (in/yd) Discharge Instruction: Secure  with tape as directed. Compression Wrap Compression Stockings Add-Ons Electronic Signature(s) Signed: 12/17/2022 5:15:20 PM By: [name] RN, [name] Entered By: [name] on 12/17/2022 14:51:20 -------------------------------------------------------------------------------- Vitals Details Patient Name: Date of Service: Deschepper, [name] 12/17/2022 2:00 PM Medical Record Number: [id number] Patient Account Number: [id number] Date of Birth/Sex: Treating RN: 10-31-1945 (77 y.o. M) Primary Care : [name] Other Clinician: Referring : Treating /Extender: [name] Weeks in Treatment: 1 Vital Signs Time Taken: 14:19 Temperature (F): 97.7 Height (in): 74 Pulse (bpm): 75 Weight (lbs): 250 Respiratory Rate (breaths/min): 18 Body Mass Index (BMI): 32.1 Blood Pressure (mmHg): 151/56 Reference Range: 80 - 120 mg / dl Electronic Signature(s) Signed: 12/19/2022 11:53:17 AM By: Thayer Dallas Entered By: Thayer Dallas on 12/17/2022 14:19:35

## 2022-12-24 ENCOUNTER — Encounter (HOSPITAL_BASED_OUTPATIENT_CLINIC_OR_DEPARTMENT_OTHER): Payer: Medicare Other | Attending: Physician Assistant | Admitting: Physician Assistant

## 2022-12-24 DIAGNOSIS — I872 Venous insufficiency (chronic) (peripheral): Secondary | ICD-10-CM | POA: Diagnosis not present

## 2022-12-24 DIAGNOSIS — L97828 Non-pressure chronic ulcer of other part of left lower leg with other specified severity: Secondary | ICD-10-CM | POA: Diagnosis not present

## 2022-12-24 DIAGNOSIS — I890 Lymphedema, not elsewhere classified: Secondary | ICD-10-CM | POA: Diagnosis present

## 2022-12-24 DIAGNOSIS — Z8546 Personal history of malignant neoplasm of prostate: Secondary | ICD-10-CM | POA: Insufficient documentation

## 2022-12-24 DIAGNOSIS — I130 Hypertensive heart and chronic kidney disease with heart failure and stage 1 through stage 4 chronic kidney disease, or unspecified chronic kidney disease: Secondary | ICD-10-CM | POA: Diagnosis not present

## 2022-12-24 DIAGNOSIS — I5032 Chronic diastolic (congestive) heart failure: Secondary | ICD-10-CM | POA: Insufficient documentation

## 2022-12-24 DIAGNOSIS — L84 Corns and callosities: Secondary | ICD-10-CM | POA: Insufficient documentation

## 2022-12-24 DIAGNOSIS — D631 Anemia in chronic kidney disease: Secondary | ICD-10-CM | POA: Diagnosis not present

## 2022-12-24 DIAGNOSIS — Z7901 Long term (current) use of anticoagulants: Secondary | ICD-10-CM | POA: Diagnosis not present

## 2022-12-24 DIAGNOSIS — Z923 Personal history of irradiation: Secondary | ICD-10-CM | POA: Insufficient documentation

## 2022-12-24 DIAGNOSIS — I87333 Chronic venous hypertension (idiopathic) with ulcer and inflammation of bilateral lower extremity: Secondary | ICD-10-CM | POA: Diagnosis not present

## 2022-12-24 DIAGNOSIS — L97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 other part of right lower leg with other specified severity: Secondary | ICD-10-CM | POA: Diagnosis not present

## 2022-12-24 DIAGNOSIS — Z86718 Personal history of other venous thrombosis and embolism: Secondary | ICD-10-CM | POA: Diagnosis not present

## 2022-12-24 NOTE — Progress Notes (Signed)
[name] ([id number]) [id number] Page 1 of 4 Visit Report for 12/24/2022 Chief Complaint Document Details Patient Name: Date of Service: [name] 12/24/2022 3:15 PM Medical Record Number: [id number] Patient Account Number: [id number] Date of Birth/Sex: Treating RN: 08-14-1945 (77 y.o. M) Primary Care Provider: [name] Other Clinician: Referring Provider: Treating Provider/Extender: [name] Weeks in Treatment: 68 Information Obtained from: Patient Chief Complaint 02/23/2020; patient is here for wounds on his bilateral lower legs in the setting of severe lymphedema 03/26/2022; patient is here for wounds on his bilateral lower legs medial aspect Electronic Signature(s) Signed: 12/24/2022 3:37:59 PM By: [name] PA-C Entered By: [name] on 12/24/2022 15:37:58 -------------------------------------------------------------------------------- Physician Orders Details Patient Name: Date of Service: [name] 12/24/2022 3:15 PM Medical Record Number: [id number] Patient Account Number: [id number] Date of Birth/Sex: Treating RN: 1945/06/21 (76 y.o. 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 Leonard Schwartz Wednesday 3pm 12/31/2022 ****extra time 60 minutes**** Return A room 9 Other: - Please ask your  Primary Care Practioner or Foot Doctor to order orthotic shoes. Anesthetic (In clinic) Topical Lidocaine 5% applied to wound bed Bathing/ Shower/ Hygiene May shower with protection but do not get wound dressing(s) wet. Protect dressing(s) with water repellant cover (for example, large plastic bag) or a cast cover and may then take shower. Edema Control - Lymphedema / SCD / Other Lymphedema Pumps. Use Lymphedema pumps on leg(s) 2-3 times a day for 45-60 minutes. If wearing any wraps or hose, do not remove them. Continue exercising as instructed. - *****pump 3 times a day for an hour each time.***** Elevate legs to the level of the heart or above for 30 minutes daily and/or when sitting for 3-4 times a day throughout the day. - +++ [name] ([id number]) [id number] Page 2 of 4 Important+++ ELEVATE THE LEGS>>>>>>>>>> Avoid standing for long periods of time. Exercise regularly Compression stocking or Garment 30-40 mm/Hg pressure to: - use juxtafits to both legs daily if compression wraps removed Other Edema Control Orders/Instructions: - When sitting please keep your feet up, keep legs at Heart Level or above. Use pillows behind calf to for comfort. Off-Loading Other: - When sitting please keep your feet up, keep legs at Heart Level or above. Use pillows behind calf to for comfort. Additional Orders / Instructions Follow Nutritious Diet - Try and increase protein intake. Goal of protein intake is 60g-100g. Wound Treatment Wound #11 - Lower Leg Wound Laterality: Left, Posterior Cleanser: Soap and Water 1 x Per Week/30 Days Discharge Instructions: May shower and wash wound with dial antibacterial soap and water prior to dressing change. Peri-Wound Care: Sween Lotion (Moisturizing lotion) 1 x Per Week/30 Days Discharge Instructions: Apply moisturizing lotion as directed Topical: Keystone antibiotic spray 1 x Per Week/30 Days Prim Dressing: Maxorb Extra Ag+  Alginate Dressing, 4x4.75 (in/in) 1 x Per Week/30 Days ary Discharge Instructions: Apply to wound bed as instructed Secondary Dressing: ABD Pad, 8x10 1 x Per Week/30 Days Discharge Instructions:  Apply over primary dressing as directed. Secondary Dressing: OptiLock Super Absorbent, 5x5.5 (in/in) (Generic) 1 x Per Week/30 Days Discharge Instructions: Apply directly to wound bed as directed Compression Wrap: Urgo K2, (equivalent to a 4 layer) two layer compression system, regular 1 x Per Week/30 Days Discharge Instructions: Apply Urgo K2 as directed (alternative to 4 layer compression). Wound #12 - Lower Leg Wound Laterality: Left, Anterior Cleanser: Soap and Water 1 x Per Week/30 Days Discharge Instructions: May shower and wash wound with dial antibacterial soap and water prior to dressing change. Peri-Wound Care: Sween Lotion (Moisturizing lotion) 1 x Per Week/30 Days Discharge Instructions: Apply moisturizing lotion as directed Topical: Keystone antibiotic spray 1 x Per Week/30 Days Prim Dressing: Maxorb Extra Ag+ Alginate Dressing, 4x4.75 (in/in) 1 x Per Week/30 Days ary Discharge Instructions: Apply to wound bed as instructed Secondary Dressing: ABD Pad, 8x10 1 x Per Week/30 Days Discharge Instructions: Apply over primary dressing as directed. Secondary Dressing: OptiLock Super Absorbent, 5x5.5 (in/in) (Generic) 1 x Per Week/30 Days Discharge Instructions: Apply directly to wound bed as directed Compression Wrap: Urgo K2, (equivalent to a 4 layer) two layer compression system, regular 1 x Per Week/30 Days Discharge Instructions: Apply Urgo K2 as directed (alternative to 4 layer compression). Wound #13 - Lower Leg Wound Laterality: Left, Lateral Cleanser: Soap and Water 1 x Per Week/30 Days Discharge Instructions: May shower and wash wound with dial antibacterial soap and water prior to dressing change. Peri-Wound Care: Sween Lotion (Moisturizing lotion) 1 x Per Week/30 Days Discharge  Instructions: Apply moisturizing lotion as directed Topical: Keystone antibiotic spray 1 x Per Week/30 Days Prim Dressing: Maxorb Extra Ag+ Alginate Dressing, 4x4.75 (in/in) 1 x Per Week/30 Days ary Discharge Instructions: Apply to wound bed as instructed Secondary Dressing: ABD Pad, 8x10 1 x Per Week/30 Days Discharge Instructions: Apply over primary dressing as directed. Secondary Dressing: OptiLock Super Absorbent, 5x5.5 (in/in) (Generic) 1 x Per Week/30 Days Discharge Instructions: Apply directly to wound bed as directed Compression Wrap: Urgo K2, (equivalent to a 4 layer) two layer compression system, regular 1 x Per Week/30 Days Discharge Instructions: Apply Urgo K2 as directed (alternative to 4 layer compression). Shutter, Dannielle [name] ([id number]) [id number] Page 3 of 4 Wound #6 - Lower Leg Wound Laterality: Right, Medial Cleanser: Soap and Water 1 x Per Week/30 Days Discharge Instructions: May shower and wash wound with dial antibacterial soap and water prior to dressing change. Peri-Wound Care: Sween Lotion (Moisturizing lotion) 1 x Per Week/30 Days Discharge Instructions: Apply moisturizing lotion as directed Topical: Keystone antibiotic spray 1 x Per Week/30 Days Prim Dressing: Maxorb Extra Ag+ Alginate Dressing, 4x4.75 (in/in) 1 x Per Week/30 Days ary Discharge Instructions: Apply to wound bed as instructed Secondary Dressing: ABD Pad, 8x10 1 x Per Week/30 Days Discharge Instructions: Apply over primary dressing as directed. Secondary Dressing: OptiLock Super Absorbent, 5x5.5 (in/in) (Generic) 1 x Per Week/30 Days Discharge Instructions: Apply directly to wound bed as directed Compression Wrap: Urgo K2, (equivalent to a 4 layer) two layer compression system, regular 1 x Per Week/30 Days Discharge Instructions: Apply Urgo K2 as directed (alternative to 4 layer compression). Wound #7 - Lower Leg Wound Laterality: Left, Medial Cleanser: Soap and Water 1 x  Per Week/30 Days Discharge Instructions: May shower and wash wound with dial antibacterial soap and water prior to dressing change. Peri-Wound Care: Sween Lotion (Moisturizing lotion) 1 x Per Week/30 Days Discharge Instructions: Apply moisturizing lotion as directed Topical: Keystone antibiotic spray 1 x Per Week/30 Days Prim  Dressing: Maxorb Extra Ag+ Alginate Dressing, 4x4.75 (in/in) 1 x Per Week/30 Days ary Discharge Instructions: Apply to wound bed as instructed Secondary Dressing: ABD Pad, 8x10 1 x Per Week/30 Days Discharge Instructions: Apply over primary dressing as directed. Secondary Dressing: OptiLock Super Absorbent, 5x5.5 (in/in) (Generic) 1 x Per Week/30 Days Discharge Instructions: Apply directly to wound bed as directed Compression Wrap: Urgo K2, (equivalent to a 4 layer) two layer compression system, regular 1 x Per Week/30 Days Discharge Instructions: Apply Urgo K2 as directed (alternative to 4 layer compression). Electronic Signature(s) Unsigned Entered By: Zenaida Deed on 12/24/2022 16:26:58 -------------------------------------------------------------------------------- Problem List Details Patient Name: Date of Service: Raupp, DA [name] 12/24/2022 3:15 PM Medical Record Number: [id number] Patient Account Number: [id number] Date of Birth/Sex: Treating RN: June 12, 1945 (77 y.o. 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 06/11/2022 No Yes bilateral lower extremity Hornik, Dannielle [name] ([id number]) [id number] Page 4 of 4 I89.0 Lymphedema, not elsewhere classified 03/26/2022 No Yes L97.828 Non-pressure chronic ulcer of other part of left lower leg with other specified  03/26/2022 No Yes severity 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 other part of right lower leg with other specified 03/26/2022 No Yes severity L84 Corns and callosities 10/15/2022 No Yes Inactive Problems Resolved Problems Electronic Signature(s) Signed: 12/24/2022 3:37:45 PM By: [name] PA-C Entered By: [name] on 12/24/2022 15:37:45

## 2022-12-25 NOTE — Progress Notes (Signed)
[name], [name] ([id number]) [id number] Page 1 of 13 Visit Report for 12/24/2022 Arrival Information Details Patient Name: Date of Service: [name], [name] 12/24/2022 3:15 PM Medical Record Number: [id number] Patient Account Number: [id number] Date of Birth/Sex: Treating RN: 1945/11/30 (77 y.o. [name] Primary Care : [name] Other Clinician: Referring : Treating /Extender: [name] in Treatment: 59 Visit Information History Since Last Visit Added or deleted any medications: No Patient Arrived: [name] Any new allergies or adverse reactions: No Arrival Time: 15:45 Had a fall or experienced change in No Accompanied By: self activities of daily living that may affect Transfer Assistance: None risk of falls: Patient Identification Verified: Yes Signs or symptoms of abuse/neglect since last visito No Secondary Verification Process Completed: Yes Hospitalized since last visit: No Patient Requires Transmission-Based Precautions: No Implantable device outside of the clinic excluding No Patient Has Alerts: Yes cellular tissue based products placed in the center Patient Alerts: Patient on Blood Thinner since last visit: Right ABI in clinic [place] Has Dressing in Place as Prescribed: Yes Has Compression in Place as Prescribed: No Pain Present Now: Yes Electronic Signature(s) Signed: 12/25/2022 12:03:42 PM By: Zenaida Deed RN, [name] Entered By: Zenaida Deed on 12/24/2022 15:47:34 -------------------------------------------------------------------------------- Compression Therapy Details Patient Name: Date of Service: [name], [name] 12/24/2022 3:15 PM Medical Record Number: [id number] Patient Account Number: [id number] Date of Birth/Sex: Treating RN: 1946-01-04 (76 y.o. [name] Primary Care : [name] Other Clinician: Referring : Treating /Extender: [name] Weeks in Treatment: 39 Compression Therapy Performed for Wound Assessment: Wound #12 Left,Anterior Lower Leg Performed By: Clinician Zenaida Deed, RN Compression Type: Double Layer Post Procedure Diagnosis Same as Pre-procedure Notes Urgo Electronic Signature(s) Signed: 12/25/2022 12:03:42 PM By: Zenaida Deed RN, [name] Entered By: Zenaida Deed on 12/24/2022 16:20:45 [name], [name] ([id number]) [id number] Page 2 of 13 -------------------------------------------------------------------------------- Compression Therapy Details Patient Name: Date of Service: [name], [name] 12/24/2022 3:15 PM Medical Record Number: [id number] Patient Account Number: [id number] Date of Birth/Sex: Treating RN: [birth year] (77 y.o. [name] Primary Care : [name] Other Clinician: Referring : Treating /Extender: [name] Weeks in Treatment: 39 Compression Therapy Performed for Wound Assessment: Wound #6 Right,Medial Lower Leg Performed By: Clinician Zenaida Deed, RN Compression Type: Double Layer Post Procedure Diagnosis Same as Pre-procedure Notes Urgo Electronic Signature(s) Signed: 12/25/2022 12:03:42 PM By: Zenaida Deed RN, [name] Entered By: Zenaida Deed on 12/24/2022 16:20:45 -------------------------------------------------------------------------------- Encounter Discharge Information Details Patient Name: Date of Service: [name], [name] 12/24/2022 3:15 PM Medical Record Number: [id number] Patient Account Number: [id number] Date of Birth/Sex: Treating RN: Mar 23, 1946 (77 y.o. [name] Primary Care : [name] Other Clinician: Referring : Treating /Extender: [name] Weeks in Treatment: 61 Encounter Discharge Information Items Discharge Condition: Stable Ambulatory Status: Cane Discharge Destination:  Home Transportation: Private Auto Accompanied By: self Schedule Follow-up Appointment: Yes Clinical Summary of Care: Patient Declined Electronic Signature(s) Signed: 12/25/2022 12:03:42 PM By: Zenaida Deed RN, [name] Entered By: Zenaida Deed on 12/24/2022 16:59:50 -------------------------------------------------------------------------------- Lower Extremity Assessment Details Patient Name: Date of Service: [name], [name] 12/24/2022 3:15 PM Medical Record Number: [id number] Patient Account Number: [id number] Date of Birth/Sex: Treating RN: November 04, 1945 (77 y.o. [name] Primary Care : [name] Other Clinician: Referring : Treating /Extender: [name] Almeda, [name] ([id number]) [id number] Page 3 of 13 Weeks in Treatment: 39 Edema  Assessment Assessed: [Left: No] [Right: No] Edema: [Left: Yes] [Right: Yes] Calf Left: Right: Point of Measurement: 40 cm From Medial Instep 56.5 cm 61.5 cm Ankle Left: Right: Point of Measurement: 13 cm From Medial Instep 44 cm 42 cm Vascular Assessment Pulses: Dorsalis Pedis Palpable: [Left:No] [Right:No] Extremity colors, hair growth, and conditions: Extremity Color: [Left:Hyperpigmented] [Right:Hyperpigmented] Hair Growth on Extremity: [Left:No] Temperature of Extremity: [Left:Warm] [Right:Warm] Capillary Refill: [Left:> 3 seconds] [Right:> 3 seconds] Dependent Rubor: [Left:No] [Right:No] Blanched when Elevated: [Left:No Yes] [Right:No Yes] Electronic Signature(s) Signed: 12/25/2022 12:03:42 PM By: Zenaida Deed RN, [name] Entered By: Zenaida Deed on 12/24/2022 15:53:51 -------------------------------------------------------------------------------- Multi-Disciplinary Care Plan Details Patient Name: Date of Service: [name], [name] 12/24/2022 3:15 PM Medical Record Number: [id number] Patient Account Number: [id number] Date of Birth/Sex: Treating  RN: 23-Oct-1945 (77 y.o. [name] Primary Care : [name] Other Clinician: Referring : Treating /Extender: [name] Weeks in Treatment: 23 Multidisciplinary Care Plan reviewed with physician Active Inactive Wound/Skin Impairment Nursing Diagnoses: Impaired tissue integrity Knowledge deficit related to ulceration/compromised skin integrity Goals: Patient will have a decrease in wound volume by X% from date: (specify in notes) Date Initiated: 03/26/2022 Target Resolution Date: 01/17/2023 Goal Status: Active Patient/caregiver will verbalize understanding of skin care regimen Date Initiated: 03/26/2022 Target Resolution Date: 01/17/2023 Goal Status: Active Ulcer/skin breakdown will have a volume reduction of 30% by week 4 Date Initiated: 03/26/2022 Date Inactivated: 05/21/2022 Target Resolution Date: 05/17/2022 Unmet Reason: see wound Goal Status: Unmet measurement. Ulcer/skin breakdown will have a volume reduction of 50% by week 8 Seddon, Kalijah ([id number]) [phone].pdf Page 4 of 13 Date Initiated: 03/26/2022 Date Inactivated: 05/21/2022 Target Resolution Date: 05/17/2022 Unmet Reason: see wound Goal Status: Unmet measurement. Interventions: Assess patient/caregiver ability to obtain necessary supplies Assess patient/caregiver ability to perform ulcer/skin care regimen upon admission and as needed Assess ulceration(s) every visit Notes: Patient stated today, "I will take my fluid pill or pump not do both."  made aware. Electronic Signature(s) Signed: 12/25/2022 12:03:42 PM By: Zenaida Deed RN, [name] Entered By: Zenaida Deed on 12/24/2022 16:10:00 -------------------------------------------------------------------------------- Pain Assessment Details Patient Name: Date of Service: Stevick, [name] 12/24/2022 3:15 PM Medical Record Number: [id number] Patient Account Number:  [id number] Date of Birth/Sex: Treating RN: Feb 11, 1946 (77 y.o. [name] Primary Care : [name] Other Clinician: Referring : Treating /Extender: [name] Weeks in Treatment: 28 Active Problems Location of Pain Severity and Description of Pain Patient Has Paino Yes Site Locations Pain Location: Pain in Ulcers With Dressing Change: Yes Duration of the Pain. Constant / Intermittento Constant Rate the pain. Current Pain Level: 8 Worst Pain Level: 9 Least Pain Level: 0 Character of Pain Describe the Pain: Aching Pain Management and Medication Current Pain Management: Medication: Yes Is the Current Pain Management Adequate: Adequate How does your wound impact your activities of daily livingo Sleep: No Bathing: No Appetite: No Relationship With Others: No Bladder Continence: No Emotions: No Bowel Continence: No Work: No Toileting: No Drive: No Dressing: No Hobbies: No Electronic Signature(s) Signed: 12/25/2022 12:03:42 PM By: Zenaida Deed RN, [name] Nadeau, [name] ([id number]) [phone].pdf Page 5 of 13 Entered By: Zenaida Deed on 12/24/2022 15:49:34 -------------------------------------------------------------------------------- Patient/Caregiver Education Details Patient Name: Date of Service: Omura, [name] 8/7/2024andnbsp3:15 PM Medical Record Number: [id number] Patient Account Number: [id number] Date of Birth/Gender: Treating RN: [birth year] (77 y.o. [name] Primary Care Physician: [name] Other Clinician: Referring Physician: Treating Physician/Extender: Leveda Anna,  [name] Weeks in Treatment: 60 Education Assessment Education Provided To: Patient Education Topics Provided Venous: Methods: Explain/Verbal Responses: Reinforcements needed, State content correctly Wound/Skin Impairment: Methods: Explain/Verbal Responses: Reinforcements  needed, State content correctly Electronic Signature(s) Signed: 12/25/2022 12:03:42 PM By: Zenaida Deed RN, [name] Entered By: Zenaida Deed on 12/24/2022 16:10:20 -------------------------------------------------------------------------------- Wound Assessment Details Patient Name: Date of Service: Azad, [name] 12/24/2022 3:15 PM Medical Record Number: [id number] Patient Account Number: [id number] Date of Birth/Sex: Treating RN: [birth year] (77 y.o. [name] Primary Care : [name] Other Clinician: Referring : Treating /Extender: [name] Weeks in Treatment: 39 Wound Status Wound Number: 11 Primary Lymphedema Etiology: Wound Location: Left, Posterior Lower Leg Wound Open Wounding Event: Gradually Appeared Status: Date Acquired: 10/19/2022 Comorbid Anemia, Lymphedema, Deep Vein Thrombosis, Hypertension, Weeks Of Treatment: 9 History: Received Radiation Clustered Wound: No Photos Tello, [name] ([id number]) [id number] Page 6 of 13 Wound Measurements Length: (cm) 4.5 Width: (cm) 2.3 Depth: (cm) 0.1 Area: (cm) 8.129 Volume: (cm) 0.813 % Reduction in Area: 34.3% % Reduction in Volume: 34.3% Epithelialization: Medium (34-66%) Tunneling: No Undermining: No Wound Description Classification: Full Thickness Without Exposed Suppor Wound Margin: Distinct, outline attached Exudate Amount: Medium Exudate Type: Serous Exudate Color: amber t Structures Foul Odor After Cleansing: No Slough/Fibrino Yes Wound Bed Granulation Amount: Medium (34-66%) Exposed Structure Granulation Quality: Pink Fascia Exposed: No Necrotic Amount: Medium (34-66%) Fat Layer (Subcutaneous Tissue) Exposed: Yes Necrotic Quality: Adherent Slough Tendon Exposed: No Muscle Exposed: No Joint Exposed: No Bone Exposed: No Periwound Skin Texture Texture Color No Abnormalities Noted: Yes No Abnormalities Noted:  Yes Moisture Temperature / Pain No Abnormalities Noted: No Temperature: No Abnormality Dry / Scaly: No Maceration: No Treatment Notes Wound #11 (Lower Leg) Wound Laterality: Left, Posterior Cleanser Soap and Water Discharge Instruction: May shower and wash wound with dial antibacterial soap and water prior to dressing change. Peri-Wound Care Sween Lotion (Moisturizing lotion) Discharge Instruction: Apply moisturizing lotion as directed Topical Keystone antibiotic spray Primary Dressing Maxorb Extra Ag+ Alginate Dressing, 4x4.75 (in/in) Discharge Instruction: Apply to wound bed as instructed Secondary Dressing ABD Pad, 8x10 Discharge Instruction: Apply over primary dressing as directed. OptiLock Super Absorbent, 5x5.5 (in/in) Discharge Instruction: Apply directly to wound bed as directed Secured With Compression Wrap Urgo K2, (equivalent to a 4 layer) two layer compression system, regular Discharge Instruction: Apply Urgo K2 as directed (alternative to 4 layer compression). Kozma, [name] ([id number]) [id number] Page 7 of 13 Compression Stockings Add-Ons Electronic Signature(s) Signed: 12/25/2022 12:03:42 PM By: Zenaida Deed RN, [name] Entered By: Zenaida Deed on 12/24/2022 16:00:53 -------------------------------------------------------------------------------- Wound Assessment Details Patient Name: Date of Service: Aggarwal, [name] 12/24/2022 3:15 PM Medical Record Number: [id number] Patient Account Number: [id number] Date of Birth/Sex: Treating RN: 1946-03-20 (77 y.o. [name] Primary Care : [name] Other Clinician: Referring : Treating /Extender: [name] Weeks in Treatment: 39 Wound Status Wound Number: 12 Primary Lymphedema Etiology: Wound Location: Left, Anterior Lower Leg Wound Open Wounding Event: Frostbite Status: Date Acquired: 12/17/2022 Comorbid Anemia,  Lymphedema, Deep Vein Thrombosis, Hypertension, Weeks Of Treatment: 1 History: Received Radiation Clustered Wound: No Photos Wound Measurements Length: (cm) 2.2 Width: (cm) 4.5 Depth: (cm) 0.3 Area: (cm) 7.775 Volume: (cm) 2.333 % Reduction in Area: -120% % Reduction in Volume: -230% Epithelialization: Small (1-33%) Tunneling: No Undermining: No Wound Description Classification: Full Thickness Without Exposed Suppor Wound Margin: Distinct, outline attached Exudate Amount: Large Exudate Type: Serous Exudate Color: amber t Structures  Foul Odor After Cleansing: No Slough/Fibrino Yes Wound Bed Granulation Amount: Medium (34-66%) Exposed Structure Granulation Quality: Red Fascia Exposed: No Necrotic Amount: Medium (34-66%) Fat Layer (Subcutaneous Tissue) Exposed: Yes Necrotic Quality: Adherent Slough Tendon Exposed: No Muscle Exposed: No Joint Exposed: No Bone Exposed: No Periwound Skin Texture Texture Color Vernier, Greggory ([id number]) [id number] Page 8 of 13 No Abnormalities Noted: No No Abnormalities Noted: No Callus: No Atrophie Blanche: No Crepitus: No Cyanosis: No Excoriation: No Ecchymosis: No Induration: No Erythema: No Rash: No Hemosiderin Staining: Yes Scarring: No Mottled: No Pallor: No Moisture Rubor: No No Abnormalities Noted: No Dry / Scaly: No Temperature / Pain Maceration: No Temperature: No Abnormality Treatment Notes Wound #12 (Lower Leg) Wound Laterality: Left, Anterior Cleanser Soap and Water Discharge Instruction: May shower and wash wound with dial antibacterial soap and water prior to dressing change. Peri-Wound Care Sween Lotion (Moisturizing lotion) Discharge Instruction: Apply moisturizing lotion as directed Topical Keystone antibiotic spray Primary Dressing Maxorb Extra Ag+ Alginate Dressing, 4x4.75 (in/in) Discharge Instruction: Apply to wound bed as instructed Secondary Dressing ABD Pad,  8x10 Discharge Instruction: Apply over primary dressing as directed. OptiLock Super Absorbent, 5x5.5 (in/in) Discharge Instruction: Apply directly to wound bed as directed Secured With Compression Wrap Urgo K2, (equivalent to a 4 layer) two layer compression system, regular Discharge Instruction: Apply Urgo K2 as directed (alternative to 4 layer compression). Compression Stockings Add-Ons Electronic Signature(s) Signed: 12/25/2022 12:03:42 PM By: Zenaida Deed RN, [name] Entered By: Zenaida Deed on 12/24/2022 16:01:35 -------------------------------------------------------------------------------- Wound Assessment Details Patient Name: Date of Service: Coppolino, [name] 12/24/2022 3:15 PM Medical Record Number: [id number] Patient Account Number: [id number] Date of Birth/Sex: Treating RN: 10-31-1945 (77 y.o. [name] Primary Care : [name] Other Clinician: Referring : Treating /Extender: [name] Weeks in Treatment: 39 Wound Status Wound Number: 13 Primary Lymphedema Etiology: Wound Location: Left, Lateral Lower Leg Wound Open Wounding Event: Gradually Appeared Status: Date Acquired: 12/24/2022 Comorbid Anemia, Lymphedema, Deep Vein Thrombosis, Hypertension, Weeks Of Treatment: 0 History: Received Radiation Clustered Wound: No Lye, Jameel ([id number]) [id number] Page 9 of 13 Photos Wound Measurements Length: (cm) 0.7 Width: (cm) 2.2 Depth: (cm) 0.1 Area: (cm) 1.21 Volume: (cm) 0.121 % Reduction in Area: % Reduction in Volume: Epithelialization: Small (1-33%) Tunneling: No Undermining: No Wound Description Classification: Full Thickness Without Exposed Suppor Wound Margin: Flat and Intact Exudate Amount: Medium Exudate Type: Serous Exudate Color: amber t Structures Foul Odor After Cleansing: No Slough/Fibrino Yes Wound Bed Granulation Amount: Small (1-33%) Exposed  Structure Granulation Quality: Pink Fascia Exposed: No Necrotic Amount: Large (67-100%) Fat Layer (Subcutaneous Tissue) Exposed: Yes Necrotic Quality: Adherent Slough Tendon Exposed: No Muscle Exposed: No Joint Exposed: No Bone Exposed: No Periwound Skin Texture Texture Color No Abnormalities Noted: Yes No Abnormalities Noted: No Hemosiderin Staining: Yes Moisture No Abnormalities Noted: Yes Temperature / Pain Temperature: No Abnormality Treatment Notes Wound #13 (Lower Leg) Wound Laterality: Left, Lateral Cleanser Soap and Water Discharge Instruction: May shower and wash wound with dial antibacterial soap and water prior to dressing change. Peri-Wound Care Sween Lotion (Moisturizing lotion) Discharge Instruction: Apply moisturizing lotion as directed Topical Keystone antibiotic spray Primary Dressing Maxorb Extra Ag+ Alginate Dressing, 4x4.75 (in/in) Discharge Instruction: Apply to wound bed as instructed Secondary Dressing ABD Pad, 8x10 Discharge Instruction: Apply over primary dressing as directed. OptiLock Super Absorbent, 5x5.5 (in/in) Discharge Instruction: Apply directly to wound bed as directed Secured With Compression Wrap Urgo K2, (equivalent to a 4 layer) two layer compression  system, regular Leer, Tyce ([id number]) [phone].pdf Page 10 of 13 Discharge Instruction: Apply Urgo K2 as directed (alternative to 4 layer compression). Compression Stockings Add-Ons Electronic Signature(s) Signed: 12/25/2022 12:03:42 PM By: Zenaida Deed RN, [name] Entered By: Zenaida Deed on 12/24/2022 16:08:45 -------------------------------------------------------------------------------- Wound Assessment Details Patient Name: Date of Service: Yeakle, [name] 12/24/2022 3:15 PM Medical Record Number: [id number] Patient Account Number: [id number] Date of Birth/Sex: Treating RN: January 08, 1946 (77 y.o. [name] Primary Care : [name] Other Clinician: Referring : Treating /Extender: [name] Weeks in Treatment: 39 Wound Status Wound Number: 6 Primary Lymphedema Etiology: Wound Location: Right, Medial Lower Leg Wound Open Wounding Event: Gradually Appeared Status: Date Acquired: 03/05/2022 Comorbid Anemia, Lymphedema, Deep Vein Thrombosis, Hypertension, Weeks Of Treatment: 39 History: Received Radiation Clustered Wound: Yes Photos Wound Measurements Length: (cm) 13 Width: (cm) 10.5 Depth: (cm) 0.4 Clustered Quantity: 1 Area: (cm) 107.207 Volume: (cm) 42.883 % Reduction in Area: 25% % Reduction in Volume: -200% Epithelialization: Small (1-33%) Tunneling: No Undermining: No Wound Description Classification: Full Thickness Without Exposed Sup Wound Margin: Distinct, outline attached Exudate Amount: Large Exudate Type: Serous Exudate Color: amber port Structures Foul Odor After Cleansing: No Slough/Fibrino Yes Wound Bed Granulation Amount: Small (1-33%) Exposed Structure Granulation Quality: Red Fascia Exposed: No Necrotic Amount: Large (67-100%) Fat Layer (Subcutaneous Tissue) Exposed: Yes Necrotic Quality: Adherent Slough Tendon Exposed: No Muscle Exposed: No Joint Exposed: No Bone Exposed: No Periwound Skin Texture Tarry, Tylon ([id number]) [id number] Page 11 of 13 Texture Color No Abnormalities Noted: No No Abnormalities Noted: No Callus: No Atrophie Blanche: No Crepitus: No Cyanosis: No Excoriation: No Ecchymosis: No Induration: No Erythema: No Rash: No Hemosiderin Staining: Yes Scarring: Yes Mottled: No Pallor: No Moisture Rubor: No No Abnormalities Noted: No Dry / Scaly: No Temperature / Pain Maceration: Yes Temperature: No Abnormality Treatment Notes Wound #6 (Lower Leg) Wound Laterality: Right, Medial Cleanser Soap and Water Discharge Instruction: May shower and wash wound with dial  antibacterial soap and water prior to dressing change. Peri-Wound Care Sween Lotion (Moisturizing lotion) Discharge Instruction: Apply moisturizing lotion as directed Topical Keystone antibiotic spray Primary Dressing Maxorb Extra Ag+ Alginate Dressing, 4x4.75 (in/in) Discharge Instruction: Apply to wound bed as instructed Secondary Dressing ABD Pad, 8x10 Discharge Instruction: Apply over primary dressing as directed. OptiLock Super Absorbent, 5x5.5 (in/in) Discharge Instruction: Apply directly to wound bed as directed Secured With Compression Wrap Urgo K2, (equivalent to a 4 layer) two layer compression system, regular Discharge Instruction: Apply Urgo K2 as directed (alternative to 4 layer compression). Compression Stockings Add-Ons Electronic Signature(s) Signed: 12/25/2022 12:03:42 PM By: Zenaida Deed RN, [name] Entered By: Zenaida Deed on 12/24/2022 16:02:13 -------------------------------------------------------------------------------- Wound Assessment Details Patient Name: Date of Service: Jaber, [name] 12/24/2022 3:15 PM Medical Record Number: [id number] Patient Account Number: [id number] Date of Birth/Sex: Treating RN: 07-13-1945 (77 y.o. [name] Primary Care : [name] Other Clinician: Referring : Treating /Extender: [name] Weeks in Treatment: 39 Wound Status Wound Number: 7 Primary Lymphedema Etiology: Wound Location: Left, Medial Lower Leg Wound Open Wounding Event: Gradually Appeared Status: Date Acquired: 03/05/2022 Comorbid Anemia, Lymphedema, Deep Vein Thrombosis, Hypertension, Weeks Of Treatment: 39 History: Received Radiation Edgerly, [name] ([id number]) [id number] Page 12 of 13 History: Received Radiation Clustered Wound: Yes Photos Wound Measurements Length: (cm) Width: (cm) Depth: (cm) Clustered Quantity: Area: (cm) Volume: (cm) 6 % Reduction in  Area: 45% 5.5 % Reduction in Volume: -65% 0.3 Epithelialization: Small (1-33%)  1 Tunneling: No 25.918 Undermining: No 7.775 Wound Description Classification: Full Thickness Without Exposed Sup Wound Margin: Distinct, outline attached Exudate Amount: Large Exudate Type: Serous Exudate Color: amber port Structures Foul Odor After Cleansing: No Slough/Fibrino Yes Wound Bed Granulation Amount: Small (1-33%) Exposed Structure Granulation Quality: Red Fascia Exposed: No Necrotic Amount: Large (67-100%) Fat Layer (Subcutaneous Tissue) Exposed: Yes Necrotic Quality: Adherent Slough Tendon Exposed: No Muscle Exposed: No Joint Exposed: No Bone Exposed: No Periwound Skin Texture Texture Color No Abnormalities Noted: No No Abnormalities Noted: No Callus: No Atrophie Blanche: No Crepitus: No Cyanosis: No Excoriation: No Ecchymosis: No Induration: No Erythema: No Rash: No Hemosiderin Staining: Yes Scarring: Yes Mottled: No Pallor: No Moisture Rubor: No No Abnormalities Noted: No Dry / Scaly: No Temperature / Pain Maceration: Yes Temperature: No Abnormality Treatment Notes Wound #7 (Lower Leg) Wound Laterality: Left, Medial Cleanser Soap and Water Discharge Instruction: May shower and wash wound with dial antibacterial soap and water prior to dressing change. Peri-Wound Care Sween Lotion (Moisturizing lotion) Discharge Instruction: Apply moisturizing lotion as directed Topical Keystone antibiotic spray Primary Dressing Maxorb Extra Ag+ Alginate Dressing, 4x4.75 (in/in) Discharge Instruction: Apply to wound bed as instructed Secondary Dressing Corvin, [name] [id number] Page 13 of 13 ABD Pad, 8x10 Discharge Instruction: Apply over primary dressing as directed. OptiLock Super Absorbent, 5x5.5 (in/in) Discharge Instruction: Apply directly to wound bed as directed Secured With Compression Wrap Urgo K2, (equivalent to a 4 layer)  two layer compression system, regular Discharge Instruction: Apply Urgo K2 as directed (alternative to 4 layer compression). Compression Stockings Add-Ons Electronic Signature(s) Signed: 12/25/2022 12:03:42 PM By: Zenaida Deed RN, [name] Entered By: Zenaida Deed on 12/24/2022 16:02:57 -------------------------------------------------------------------------------- Vitals Details Patient Name: Date of Service: Dancel, [name] 12/24/2022 3:15 PM Medical Record Number: [id number] Patient Account Number: [id number] Date of Birth/Sex: Treating RN: [birth year] (77 y.o. [name] Primary Care : [name] Other Clinician: Referring : Treating /Extender: [name] Weeks in Treatment: 70 Vital Signs Time Taken: 15:47 Temperature (F): 98.8 Height (in): 74 Pulse (bpm): 96 Weight (lbs): 250 Respiratory Rate (breaths/min): 20 Body Mass Index (BMI): 32.1 Blood Pressure (mmHg): 170/60 Reference Range: 80 - 120 mg / dl Electronic Signature(s) Signed: 12/25/2022 12:03:42 PM By: Zenaida Deed RN, [name] Entered By: Zenaida Deed on 12/24/2022 15:50:06

## 2022-12-31 ENCOUNTER — Encounter (HOSPITAL_BASED_OUTPATIENT_CLINIC_OR_DEPARTMENT_OTHER): Payer: Medicare Other | Admitting: Physician Assistant

## 2022-12-31 DIAGNOSIS — I890 Lymphedema, not elsewhere classified: Secondary | ICD-10-CM | POA: Diagnosis not present

## 2022-12-31 NOTE — Progress Notes (Addendum)
Paschal, [name] ([id number]) [id number] Page 1 of 15 Visit Report for 12/31/2022 Chief Complaint Document Details Patient Name: Date of Service: [name], [name] 12/31/2022 3:00 PM Medical Record Number: [id number] Patient Account Number: [id number] Date of Birth/Sex: Treating RN: 07/28/1945 (77 y.o. M) Primary Care Provider: [name] Other Clinician: Referring Provider: Treating Provider/Extender: [name] Weeks in Treatment: 40 Information Obtained from: Patient Chief Complaint 02/23/2020; patient is here for wounds on his bilateral lower legs in the setting of severe lymphedema 03/26/2022; patient is here for wounds on his bilateral lower legs medial aspect Electronic Signature(s) Signed: 12/31/2022 3:54:03 PM By: [name] PA-C Entered By: [name] on 12/31/2022 12:54:03 -------------------------------------------------------------------------------- HPI Details Patient Name: Date of Service: [name], [name] 12/31/2022 3:00 PM Medical Record Number: [id number] Patient Account Number: [id number] Date of Birth/Sex: Treating RN: April 02, 1946 (77 y.o. M) Primary Care Provider: [name] Other Clinician: Referring Provider: Treating Provider/Extender: [name] Weeks in Treatment: 40 History of Present Illness HPI Description: ADMISSION 02/23/2020 Patient is a 77 year old man who lives in [place] who arrives accompanied by his wife. He has a history of chronic lymphedema and venous insufficiency in his bilateral lower legs which may have something to do that with having a history of DVT as well as being treated for prostate cancer. In any case he recently got compression pumps at home but compliance has been an issue here. He has compression stockings however they are probably not sufficient enough to control swelling. They tell us that things deteriorated for him in late August he was admitted  to [facility] for 7 days. This was with cellulitis I think of his bilateral lower legs. Discharge he was noted to have wounds on his bilateral lower legs. He was discharged on Bactrim. They tried to get him home health through [facility] of course they declined him. His wife is been wrapping these applying some form of silver foam dressing. He has a history of wounds before although nothing that would not heal with basic home topical dressings. He has 2 areas on the left medial, left anterior and left lateral and a smaller area on the right medial. All of these have considerable depth. Past medical history includes iron deficiency anemia, lymphedema followed by the rehab center at [facility] with lymphedema wraps I believe, DVT on chronic anticoagulation, prostate cancer, chronic venous insufficiency, hypertension. As mentioned he has compression pumps but does not use them. ABIs in our clinic were noncompressible bilaterally 10/14; patient with severe bilateral lymphedema right greater than left. He came in with bilateral lower extremity wounds left greater than right. Even though the right side has more of the edema most of the wounds here almost closed on the right medial. He has 3 remaining wounds on the left We have been using silver alginate under 4-layer compression I have been trying to get him to be compliant with his external compression pumps 10/21; patient with 3 small wounds on the left leg and 1 on the right medial in the setting of severe lymphedema and chronic venous insufficiency. We have been using silver alginate under 4-layer compression he is using his external compression pumps twice a day 11/4; ARTERIAL STUDIES on the right show an ABI of 1.02 TBI of 0.858 with biphasic waveforms on the left 0.98 with a TBI of 0.55 and biphasic waveforms. Does not look like he has significant arterial disease. We are treating him for  lymphedema he has compression pumps. He  has punched-out areas on the left anterior left lateral and right medial lower extremities 11/11; after we obtained his arterial studies I put him in 4 layer compression. He is using his compression pumps probably once a day although I have asked [name], [name] ([id number]) [id number] Page 2 of 15 him to do twice. Primary dressing to the wound is silver collagen he has severe lymphedema likely secondary to chronic venous insufficiency. Wounds on the left lateral, left medial and left anterior and a small area on the right medial 12/2; the area on the right anterior lower leg has healed. We initially thought that the area medially had healed as well however when her discharge nurse came in she detected fluid in the wound simply opened up. This is actually worse than I remember this pain. The area on the left lateral potentially slightly smaller He is also complaining about pain in his left hand he says that this is actually been getting some better he has been using topical creams on this. She asked that I look at this 12/9 after last weeks issues we have 2 wounds one on the right medial lower leg and 1 on the left lateral. Both of these are in the same condition. I think because of thickened skin secondary to chronic lymphedema these wounds actually have depth of almost 0.8 cm. 12/16; the patient has 2 small but deep wounds one on the right medial and one on the left lateral. The right medial is actually the worst of these. He arrives in clinic today with absolutely terrible edema in the right leg apparently his 4-layer wrap fell down to just above his ankle he did not think about this he is apparently been continuing to use his compression pump twice a day. The left leg looks a lot better. 05/09/2020 upon evaluation today patient appears to be doing decently well in regard to his wounds. Everything is measuring smaller the right leg still has a little bit deeper wound in  the left seems to be almost completely healed in my opinion I am very pleased in general with how things are progressing. He has a 4- layer compression wrap we have been using endoform today we will probably have to use collagen just based on the fact that we do not have endoform it is on order. 1/6; the patient's wound on the left lateral lower leg has healed. Still has 1 on the right medial. 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 Using compression pumps at home twice a day. 1/13; left lateral lower leg is still healed. He has a deep punched out rectangular shaped wound on the right medial calf. Looking down at this it appears that he is attempting to epithelialize around the edges of the wound and on the base as well. His edema is reasonably well controlled we have been using collagen with absolutely no effect 1/20; left lateral lower leg remains closed he has extremitease stockings. The area on the right medial calf I aggressively debrided last week measures larger but the surface looks better. We have been using Hydrofera Blue. We ran Oasis through his insurance but we have not seen the results of this 1/27; left lower leg wound with chronic venous insufficiency and secondary lymphedema. I did aggressive debridement on this last week the wound seems to have come in healthy looking surface using Hydrofera Blue. He was denied for Oasis 2/3; small divot in the right medial lower leg.  Under illumination the walls of this divot are epithelialized however the base has slough which I removed with a curette we have been using Hydrofera Blue 2/10 small divot on the right medial lower leg pinpoint illumination at the base of this cone-shaped wound. We have been using Hydrofera Blue but I will switch to calcium alginate this week 2/17; the small divot on the right medial lower leg is fully epithelialized. There is no visible open area under illumination. He has his own stocking for the  right leg similar to the one he has been wearing on the left. 03/26/2022; READMISSION This is a now 77 year old man that we had in the clinic from 02/23/2020 through 07/05/2020. At that point he had bilateral lower extremity wounds left greater than right in the setting of severe lymphedema. He had already obtained compression pumps ordered for him I think from the wound care clinic in [facility] so I do not really have record of what he has been using. He claims to be using them once a day but there is a problem with the sleeve on the left leg. About 2 weeks ago he was hospitalized from 03/11/2022 through 03/14/2022 with diastolic congestive heart failure. 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 He was diuresed. Developed some prerenal azotemia and he has not been taking any diuretics currently. He has not been putting stockings on his legs since he got out of hospital and still has his legs dependent for long periods. Past medical history history of prostate cancer treated with prostatectomy and radiation this was apparently about 8 years ago, history of DVT on chronic Coumadin, history of lymphedema was managed for a while at the clinic in [place]. History of inguinal hernia repair in September 22, hypertension, stage IIIb chronic renal failure ABIs today were noncompressible on the right 1.12 on the left 04-02-2022 upon evaluation today patient appears to be doing well currently in regard to his legs I do feel like both areas that are draining are actually much drier than they were in the picture last week although the left is drier than the right. He is tolerating the 4-layer compression wraps at this point he did contact the pump company and they are actually working on getting him a new compression sleeve for one of his legs which have previously popped and was not functioning properly. 04-23-2022 upon evaluation today patient appears  to be doing well currently in regard to his wounds on the legs. I am actually very pleased with where things stand and I do feel like that we are headed in the right direction. Fortunately there is no sign of active infection locally or systemically at this time. 05-07-2022 upon evaluation today patient appears to be doing well currently in regard to his wounds in fact things are showing signs of improvement which is good news I do not see too much that actually appears to be open and I am very pleased in that regard. No fevers, chills, nausea, vomiting, or diarrhea. 05-21-2022 upon evaluation today patient appears to be doing somewhat poorly in regard to drainage of his lower extremities bilaterally. The right is greater than left as far as the weeping area. Nonetheless it seems to be getting worse not better. He actually has pitting edema which is at least 2+ to the thighs and I am concerned about the fact that he is may be fluid overloaded in general and that is the reason why  we cannot get this under control. I know he is not using his pumps all the time because he actually told the nurse that he was either going to pump or he was going to use his fluid pills but not do both. For that reason I do think that he needs to be really doing both in order to get the fluid out as effectively as possible obviously with the 4-layer compression wraps were doing as much as we can from a compression standpoint but it is really not enough. He tells me that he elevates his leg is much as he can in between pumping and other activity throughout the day. 05-28-2022 upon evaluation today patient appears to be doing better in regard to his wounds although the measurements may be a little bit larger this is a very difficult wound to heal it is very indistinct in a lot of areas. Nonetheless there is can be some need for sharp debridement in regard to both medial and lateral legs. Fortunately I see no signs of active  infection locally nor systemically at this time. No fevers, chills, nausea, vomiting, or diarrhea. 06-04-2022 upon evaluation today patient appears to be doing poorly in general in regard to the wounds on his legs. He still continues to have a tremendous amount of fluid not just in the lower portion of his leg but to be honest his thigh where he has 2-3+ pitting edema in the thigh as well. Unfortunately I do not know that we will be able to get this healed effectively and keep it healed on the lower extremities unless he gets the overall fluid situation taking and under control. Fortunately I do not see any signs of infection locally nor systemically which is great news. He just seems to be very fluid overloaded. 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 He has been tolerating the dressing changes with the compression wraps without complication. Fortunately I do not see any evidence of infection at this time which is great news. No fevers, chills, nausea, vomiting, or diarrhea. 06-18-2022 upon evaluation today patient appears to be doing well currently in regard to his wounds as far as not looking like they are terribly infected but nonetheless I am concerned about a subacute infection secondary to the fact that he continues to have spreading despite the compression therapy. We actually did do an Unna boot on him last week this is actually the first wrap that actually stayed up everything else has been sliding down quite significantly. Fortunately there does not appear to be any signs of infection systemically at this time. With that being said I do believe that locally there seems to be an issue going on here and again I Ernie Hew do a PCR culture to see what that shows also think that I am going to put him on a broad-spectrum antibiotic, doxycycline to see how that will help as well. He does  tell me that coming into the clinic today that he was feeling short of breath like "he was about to have a heart attack" because he was having such a hard time breathing. He says that he told this to Dr. Jodelle Green his cardiologist as well when he was evaluated in the [place] ([id number]) [id number] Page 3 of 15 past 1 to 2 weeks. 07-02-2022 upon evaluation today patient appears to be doing poorly currently in regard to his wound. He has been tolerating the dressing changes.  Unfortunately he has not had any compression wraps on for the past week because he was unable to make it in for his appointment last week. With that being said he has a significant amount of drainage he tells me has been using his pumps but despite this in the pumps he still has been draining quite a bit. The drainage is also somewhat purulent unfortunately. We did attempt to get in touch with his cardiologist last week unfortunately we were unable to get up with him I did advise that the patient needs to get in touch with him upon leaving today in order to make sure they know he is on the new antibiotics I am going to send him this will be Levaquin and Augmentin. 07-09-2022 upon evaluation today patient appears to be doing about the same in regard to his legs he may have just a slight amount of improvement with regard to the drainage probably Keystone topical antibiotics are helping in this regard to some degree. Fortunately there does not appear to be any signs of active infection systemically which is great news. No fevers, chills, nausea, vomiting, or diarrhea. 07-16-2022 upon evaluation today patient appears to be doing well currently in regard to his wound. He has been tolerating the dressing changes without complication. Fortunately there does not appear to be any signs of active infection locally nor systemically at this time. With that being said he cannot keep the wraps up he tells me on the  left side he had to cut this down because it got too tight. He has been using his pumps but he is on the right side the wrap actually straight down causing some pushing around the central part of his leg just below the calf I think this is a bigger risk for him that help at this point. I think that we may need to try something different he should be getting his compression socks shortly he tells me they were ordered last Thursday. 3/6; ; this is a patient who lives in [place]. He has severe bilateral lymphedema. He has compression pumps, we have been using kerlix Ace wrap Keystone. He is changing the dressing. We do not have home health. 08-06-2022 upon evaluation today patient appears to be doing a little better in regard to his wounds in general at this point. Fortunately there does not appear to be any signs of active infection locally nor systemically at this time which is great news and overall I am extremely pleased with where we stand today. 08-13-2022 upon evaluation today patient appears to actually be doing significantly better compared to last week. He actually did go to the hospital I told him that he needed to when he left here and he actually did go. With that being said they actually ended up admitting him he was having shortness of breath and I thought it might be related to congestive heart failure turns out he actually had a pulmonary embolism. Subsequently they were able to get him off of the Coumadin switching over to Eliquis to get things stabilized in that regard they also had them wrapped and got his swelling under control on his legs he actually looks much better pretty much across the board at this point. I am very pleased in that regard. With that being said I am very happy that he finally went that could have been a very dangerous situation. 08-20-2022 upon evaluation today patient appears to be doing well currently in regard to his wound. Has been  tolerating the dressing changes  without complication. Fortunately there does not appear to be any signs of active infection locally nor systemically at this time. I think his legs are doing better there is some need for sharp debridement today. 08-27-2022 upon evaluation patient is actually making excellent progress. I am actually very pleased with where he stands and I think that he is moving in the right direction. In general I think that we are looking pretty good at the moment. 09-03-2022 upon evaluation today patient appears to be doing well currently in regard to his wound. He is actually tolerating dressing changes on the left and right leg without complication. Fortunately I do not see any need for debridement of the left leg the right leg I think we probably do some need to perform some debridement here. 09-10-2022 upon evaluation today patient's wounds actually showed signs of improvement in both legs I do not see much is going require debridement today which was great news. Fortunately I do not see any evidence of infection which I think is also excellent he seems to be using his pumps and doing everything right I am happy about how this is progressing at this point. 09-17-2022 upon evaluation today patient appears to be doing decently well in regard to his wounds. He has been tolerating the dressing changes without complication. Fortunately there does not appear to be any signs of active infection at this time which is good news. 09-24-2022 upon evaluation today patient appears to be doing well currently in regard to his wounds. He has been tolerating the dressing changes without complication. Fortunately there does not appear to be any signs of active infection locally nor systemically which is great news. No fevers, chills, nausea, vomiting, or diarrhea. 10-08-2022 upon evaluation today patient appears to be doing excellent currently in regard to his wound. He has been tolerating the dressing changes without complication  though it does not sound like he has been using his compression wraps for a bit here. He does think he was doing better with the [facility] topical antibiotics we can definitely go back to using that but I think the biggest issue here is that his swelling is just very out of control and needs to be under control. I discussed that with him today. 10-15-2022 upon evaluation today patient appears to be doing well currently in regard to his wounds. He is actually making some progress which is good news. Fortunately I do not see any evidence of active infection locally nor systemically which is great news as well. No fevers, chills, nausea, vomiting, or diarrhea. He does have a callused area on the plantar aspect of his left foot which is actually causing some pain and he wonders if I can trim this down for him. 10-22-2022 upon evaluation today patient seems to be making progress. He is actually doing quite well and very pleased in that regard. I do not see any signs of active infection at this time. 11-05-2022 upon evaluation patient appears to be making progress although slowly towards closure. He seems to be doing well with regard to his legs were still using the [facility] topical antibiotics and he seems to be doing quite well. He is going require some sharp debridement today. 11-19-2022 upon evaluation today patient unfortunately appears to be extremely swollen at this point. He tells me that he ran out of supplies he also tells me his leg started leaking more because of not having supplies he was unable to wear his compression  wraps that is the juxta fit compression wraps. Therefore his legs are extremely swollen much larger than normal and do not appear to be doing well at all today. He is going require some debridement I also think he is going require [place] to perform compression wrapping today. 11-26-2022 upon evaluation today patient appears to be doing well currently in regard to his legs as far as infection  is concerned I see nothing that appears to be infected. Fortunately I do not see any signs of active infection systemically either which is also good news. With that being said he still is extremely swollen as far as his legs are concerned. I do not see any signs of overall worsening but also do not see any signs of significant improvement which is the major issue here. 12-03-2022 upon evaluation today patient appears to be doing poorly still in regard to his legs the apparently has been taken off the wraps and not leaving the week by week. With that being said he has not had really the dressings to put on the release that is running out and subsequently though he is using his wraps I am not sure that he has been keeping them on like he needs to. I explained to him I really wanted to have the wraps that I put on him left on until he comes back to see me so that we can keep the compression under better control. He voiced understanding today he tells me that he was "confused about that". Nonetheless based on what I am seeing right now also think that he is up on his feet too much I think he needs to get the feet elevated. 7/24; this is a patient with severe bilateral stage III lymphedema. On the right lower leg medially is a large area of macerated denuded skin was too small deeper areas in this. On the left leg he is 2 areas that are a little more standard in terms of wounds. Massive lymphedema bilaterally. He has compression pumps at home which he uses for 1 hour twice a day 12-17-2022 upon evaluation today patient's legs though a little bit smaller still continue to have significant issues with weeping here this just does not dry. I really think he needs to be changing this daily and this means we will probably have to try to go back to the Velcro wraps and give this a shot. I think may be going to the Velcro wraps, changing the dressing to a superabsorber like XtraSorb or the like, and have him elevate  his legs and do his pumps 3 times per day is Kirstein, Graham ([id number]) [id number] Page 4 of 15 probably going to be the way to go to try to see if we can make some improvement here. 12-24-2022 upon evaluation today patient unfortunately is still struggling to get anything under control. We have been extremely aggressive and trying to: Control his swelling everything we do however seems to be met with resistance. He tells me that he is pumping 3 times per day with his lymphedema pumps. He also tells me that he is try to elevate his legs is much as possible and that subsequently he has been keeping his compression wraps on. He goes back and forth between saying that he can keep our wraps on and not but right now I really do not see that he is doing well with the juxta fit is at least not from the standpoint of improving the overall appearance I think it  does to some degree help maintain but we are still struggling here. I have voiced a concern to the patient that despite everything you are doing I am still not seeing a lot of improvement here and to be honest I am not sure what is can take get this under control he may have to go into the hospital for admission. 12-31-2022 upon evaluation today patient appears to be doing well currently in regard to his wounds. They seem a little better although he has several pustules around the legs in general but has been concerned about infection. I am actually going to go ahead and see about getting an antibiotic sent into the pharmacy today although I am going to obtain a PCR culture as well. Electronic Signature(s) Signed: 12/31/2022 5:00:50 PM By: [name] PA-C Entered By: [name] on 12/31/2022 14:00:50 -------------------------------------------------------------------------------- Paring/cutting 1 benign hyperkeratotic lesion Details Patient Name: Date of Service: [name], [name] 12/31/2022 3:00 PM Medical Record Number:  [id number] Patient Account Number: [id number] Date of Birth/Sex: Treating RN: [birth year] (77 y.o. [name]: [name] Other Clinician: Referring Provider: Treating Provider/Extender: [name] Weeks in Treatment: 40 Procedure Performed for: Non-Wound Location Performed By: Physician [name], PA Post Procedure Diagnosis Same as Pre-procedure Notes Right Calcaneus-patient c/o pain at this site Electronic Signature(s) Signed: 12/31/2022 5:04:24 PM By: [name] PA-C Signed: 12/31/2022 5:15:28 PM By: [name] RN Entered By: [name] on 12/31/2022 13:23:25 -------------------------------------------------------------------------------- Physical Exam Details Patient Name: Date of Service: [name], [name] 12/31/2022 3:00 PM Medical Record Number: [id number] Patient Account Number: [id number] Date of Birth/Sex: Treating RN: 1945-07-09 (77 y.o. M) Primary Care Provider: [name] Other Clinician: Referring Provider: Treating Provider/Extender: [name] Weeks in Treatment: 40 Constitutional Well-nourished and well-hydrated in no acute distress. Respiratory normal breathing without difficulty. Psychiatric this patient is able to make decisions and demonstrates good insight into disease process. Alert and Oriented x 3. pleasant and cooperative. Sicard, [name] ([id number]) [id number] Page 5 of 15 Notes Upon inspection patient's wounds again showed signs of pretty good improvement here but however he still has areas that have been a little bit concerned about the possibility of infection. For that reason I discussed with him that it may be good to go and put him on an antibiotic while we await the results of the culture and then we will proceed as planned following. Electronic Signature(s) Signed: 12/31/2022 5:01:05 PM By: [name] PA-C Entered By:  [name] on 12/31/2022 14:01:05 -------------------------------------------------------------------------------- Physician Orders Details Patient Name: Date of Service: [name], [name] 12/31/2022 3:00 PM Medical Record Number: [id number] Patient Account Number: [id number] Date of Birth/Sex: Treating RN: 1945-05-28 (77 y.o. [name]: [name] Other Clinician: Referring Provider: Treating Provider/Extender: [name] Weeks in Treatment: 43 Verbal / Phone Orders: No Diagnosis Coding ICD-10 Coding Code Description [phone] Chronic venous hypertension (idiopathic) with ulcer and inflammation of bilateral lower extremity I89.0 Lymphedema, not elsewhere classified 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 other part of right lower leg with other specified severity L84 Corns and callosities Follow-up Appointments ppointment in 1 week. [name] PA Wednesday ****extra time 60 minutes**** Return A 01/07/23 at 2pm ppointment in 2 weeks. [name] PA 01/14/23 at 3pm Return A Return appointment in 3 weeks. Leonard Schwartz Wed.01/21/23 at  3pm Return appointment in 1 month. - Please see front desk to schedule appointment Other: - Please ask your Primary Care Practioner or Foot Doctor to order orthotic shoes. Anesthetic (In clinic) Topical Lidocaine 5% applied to wound bed Bathing/ Shower/ Hygiene May shower with protection but do not get wound dressing(s) wet. Protect dressing(s) with water repellant cover (for example, large plastic bag) or a cast cover and may then take shower. Edema Control - Lymphedema / SCD / Other Lymphedema Pumps. Use Lymphedema pumps on leg(s) 2-3 times a day for 45-60 minutes. If wearing any wraps or hose, do not remove them. Continue exercising as instructed. - *****pump 3 times a day for an hour each time.***** Elevate  legs to the level of the heart or above for 30 minutes daily and/or when sitting for 3-4 times a day throughout the day. - +++ Important+++ ELEVATE THE LEGS>>>>>>>>>> Avoid standing for long periods of time. Exercise regularly Compression stocking or Garment 30-40 mm/Hg pressure to: - use juxtafits to both legs daily if compression wraps removed Other Edema Control Orders/Instructions: - When sitting please keep your feet up, keep legs at Heart Level or above. Use pillows behind calf to for comfort. Off-Loading Other: - When sitting please keep your feet up, keep legs at Heart Level or above. Use pillows behind calf to for comfort. Additional Orders / Instructions Follow Nutritious Diet - Try and increase protein intake. Goal of protein intake is 60g-100g. Wound Treatment Wound #11 - Lower Leg Wound Laterality: Left, Posterior Cleanser: Soap and Water 1 x Per Week/30 Days Discharge Instructions: May shower and wash wound with dial antibacterial soap and water prior to dressing change. Peri-Wound Care: Sween Lotion (Moisturizing lotion) 1 x Per Week/30 Days Discharge Instructions: Apply moisturizing lotion as directed [name], [name] ([id number]) [id number] Page 6 of 15 Topical: Keystone antibiotic spray 1 x Per Week/30 Days Prim Dressing: Maxorb Extra Ag+ Alginate Dressing, 4x4.75 (in/in) 1 x Per Week/30 Days ary Discharge Instructions: Apply to wound bed as instructed Secondary Dressing: ABD Pad, 8x10 1 x Per Week/30 Days Discharge Instructions: Apply over primary dressing as directed. Secondary Dressing: OptiLock Super Absorbent, 5x5.5 (in/in) (Generic) 1 x Per Week/30 Days Discharge Instructions: Apply directly to wound bed as directed Compression Wrap: Urgo K2, (equivalent to a 4 layer) two layer compression system, regular 1 x Per Week/30 Days Discharge Instructions: Apply Urgo K2 as directed (alternative to 4 layer compression). Wound #12 - Lower Leg  Wound Laterality: Left, Anterior Cleanser: Soap and Water 1 x Per Week/30 Days Discharge Instructions: May shower and wash wound with dial antibacterial soap and water prior to dressing change. Peri-Wound Care: Sween Lotion (Moisturizing lotion) 1 x Per Week/30 Days Discharge Instructions: Apply moisturizing lotion as directed Topical: Keystone antibiotic spray 1 x Per Week/30 Days Prim Dressing: Maxorb Extra Ag+ Alginate Dressing, 4x4.75 (in/in) 1 x Per Week/30 Days ary Discharge Instructions: Apply to wound bed as instructed Secondary Dressing: ABD Pad, 8x10 1 x Per Week/30 Days Discharge Instructions: Apply over primary dressing as directed. Secondary Dressing: OptiLock Super Absorbent, 5x5.5 (in/in) (Generic) 1 x Per Week/30 Days Discharge Instructions: Apply directly to wound bed as directed Compression Wrap: Urgo K2, (equivalent to a 4 layer) two layer compression system, regular 1 x Per Week/30 Days Discharge Instructions: Apply Urgo K2 as directed (alternative to 4 layer compression). Wound #13 - Lower Leg Wound Laterality: Left, Lateral Cleanser: Soap and Water 1 x Per Week/30 Days Discharge Instructions: May shower and wash wound with dial antibacterial  soap and water prior to dressing change. Peri-Wound Care: Sween Lotion (Moisturizing lotion) 1 x Per Week/30 Days Discharge Instructions: Apply moisturizing lotion as directed Topical: Keystone antibiotic spray 1 x Per Week/30 Days Prim Dressing: Maxorb Extra Ag+ Alginate Dressing, 4x4.75 (in/in) 1 x Per Week/30 Days ary Discharge Instructions: Apply to wound bed as instructed Secondary Dressing: ABD Pad, 8x10 1 x Per Week/30 Days Discharge Instructions: Apply over primary dressing as directed. Secondary Dressing: OptiLock Super Absorbent, 5x5.5 (in/in) (Generic) 1 x Per Week/30 Days Discharge Instructions: Apply directly to wound bed as directed Compression Wrap: Urgo K2, (equivalent to a 4 layer) two layer compression system,  regular 1 x Per Week/30 Days Discharge Instructions: Apply Urgo K2 as directed (alternative to 4 layer compression). Wound #6 - Lower Leg Wound Laterality: Right, Medial Cleanser: Soap and Water 1 x Per Week/30 Days Discharge Instructions: May shower and wash wound with dial antibacterial soap and water prior to dressing change. Peri-Wound Care: Sween Lotion (Moisturizing lotion) 1 x Per Week/30 Days Discharge Instructions: Apply moisturizing lotion as directed Topical: Keystone antibiotic spray 1 x Per Week/30 Days Prim Dressing: Maxorb Extra Ag+ Alginate Dressing, 4x4.75 (in/in) 1 x Per Week/30 Days ary Discharge Instructions: Apply to wound bed as instructed Secondary Dressing: ABD Pad, 8x10 1 x Per Week/30 Days Discharge Instructions: Apply over primary dressing as directed. Secondary Dressing: OptiLock Super Absorbent, 5x5.5 (in/in) (Generic) 1 x Per Week/30 Days Discharge Instructions: Apply directly to wound bed as directed Compression Wrap: Urgo K2, (equivalent to a 4 layer) two layer compression system, regular 1 x Per Week/30 Days Discharge Instructions: Apply Urgo K2 as directed (alternative to 4 layer compression). Lalla, [name] ([id number]) [id number] Page 7 of 15 Wound #7 - Lower Leg Wound Laterality: Left, Medial Cleanser: Soap and Water 1 x Per Week/30 Days Discharge Instructions: May shower and wash wound with dial antibacterial soap and water prior to dressing change. Peri-Wound Care: Sween Lotion (Moisturizing lotion) 1 x Per Week/30 Days Discharge Instructions: Apply moisturizing lotion as directed Topical: Keystone antibiotic spray 1 x Per Week/30 Days Prim Dressing: Maxorb Extra Ag+ Alginate Dressing, 4x4.75 (in/in) 1 x Per Week/30 Days ary Discharge Instructions: Apply to wound bed as instructed Secondary Dressing: ABD Pad, 8x10 1 x Per Week/30 Days Discharge Instructions: Apply over primary dressing as directed. Secondary Dressing:  OptiLock Super Absorbent, 5x5.5 (in/in) (Generic) 1 x Per Week/30 Days Discharge Instructions: Apply directly to wound bed as directed Compression Wrap: Urgo K2, (equivalent to a 4 layer) two layer compression system, regular 1 x Per Week/30 Days Discharge Instructions: Apply Urgo K2 as directed (alternative to 4 layer compression). Laboratory naerobe culture (MICRO) - PCR swab from Right Medial Lower leg - (ICD10 I89.0 - Bacteria identified in Unspecified specimen by A Lymphedema, not elsewhere classified) LOINC Code: 635-3 Convenience Name: Anaerobic culture Patient Medications llergies: cephalexin, mometasone furoate, doxycycline monohydrate A Notifications Medication Indication Start End 12/31/2022 levofloxacin DOSE 1 - oral 500 mg tablet - 1 tablet oral once daily x 14 days Electronic Signature(s) Signed: 12/31/2022 5:03:30 PM By: [name] PA-C Entered By: [name] on 12/31/2022 14:03:28 Prescription 12/31/2022 -------------------------------------------------------------------------------- [name], [name] Balm PA Patient Name: Provider: [birth year] [id number] Date of Birth: NPI#Geoffery Spruce Sex: DEA #: [phone] Phone #: License #: UPN: Patient Address: [address] Eligha Bridegroom [facility] Wound [place], [place] [address] [address] [place], [place] [address] [phone] Allergies cephalexin; mometasone furoate; doxycycline monohydrate Provider's Orders naerobe culture -  ICD10: I89.0 - PCR swab from Right Medial Lower leg Bacteria identified in Unspecified specimen by A LOINC Code: 635-3 Ambrocio, [name] ([id number]) [id number] Page 8 of 15 Convenience Name: Anaerobic culture Hand Signature: Date(s): Electronic Signature(s) Signed: 12/31/2022 5:04:24 PM By: [name] PA-C Entered By: [name] on 12/31/2022  14:03:30 -------------------------------------------------------------------------------- Problem List Details Patient Name: Date of Service: [name], [name] 12/31/2022 3:00 PM Medical Record Number: [id number] Patient Account Number: [id number] Date of Birth/Sex: Treating RN: 03-12-1946 (77 y.o. M) Primary Care Provider: [name] Other Clinician: Referring Provider: Treating Provider/Extender: [name] in Treatment: 66 Active Problems ICD-10 Encounter Code Description Active Date MDM Diagnosis I87.333 Chronic venous hypertension (idiopathic) with ulcer and inflammation of 06/11/2022 No Yes bilateral lower extremity I89.0 Lymphedema, not elsewhere classified 03/26/2022 No Yes L97.828 Non-pressure chronic ulcer of other part of left lower leg with other specified 03/26/2022 No Yes severity 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 other part of right lower leg with other specified 03/26/2022 No Yes severity L84 Corns and callosities 10/15/2022 No Yes Inactive Problems Resolved Problems Electronic Signature(s) Signed: 12/31/2022 3:53:16 PM By: [name] PA-C Entered By: [name] on 12/31/2022 12:53:16 Progress Note Details -------------------------------------------------------------------------------- [name], [name] ([id number]) [id number] Page 9 of 15 Patient Name: Date of Service: [name], [name] 12/31/2022 3:00 PM Medical Record Number: [id number] Patient Account Number: [id number] Date of Birth/Sex: Treating RN: July 29, 1945 (77 y.o. M) Primary Care Provider: [name] Other Clinician: Referring Provider: Treating Provider/Extender: [name] Weeks in Treatment: 40 Subjective Chief Complaint Information obtained from Patient 02/23/2020; patient is here for wounds on his bilateral lower legs in the setting of severe lymphedema 03/26/2022; patient is here for wounds on his bilateral  lower legs medial aspect History of Present Illness (HPI) ADMISSION 02/23/2020 Patient is a 77 year old man who lives in [place] who arrives accompanied by his wife. He has a history of chronic lymphedema and venous insufficiency in his bilateral lower legs which may have something to do that with having a history of DVT as well as being treated for prostate cancer. In any case he recently got compression pumps at home but compliance has been an issue here. He has compression stockings however they are probably not sufficient enough to control swelling. They tell us that things deteriorated for him in late August he was admitted to [facility] for 7 days. This was with cellulitis I think of his bilateral lower legs. Discharge he was noted to have wounds on his bilateral lower legs. He was discharged on Bactrim. They tried to get him home health through [facility] of course they declined him. His wife is been wrapping these applying some form of silver foam dressing. He has a history of wounds before although nothing that would not heal with basic home topical dressings. He has 2 areas on the left medial, left anterior and left lateral and a smaller area on the right medial. All of these have considerable depth. Past medical history includes iron deficiency anemia, lymphedema followed by the rehab center at [facility] with lymphedema wraps I believe, DVT on chronic anticoagulation, prostate cancer, chronic venous insufficiency, hypertension. As mentioned he has compression pumps but does not use them. ABIs in our clinic were noncompressible bilaterally 10/14; patient with severe bilateral lymphedema right greater than left. He came in with bilateral lower extremity wounds left greater than right. Even though the right side has more of the edema  most of the wounds here almost closed on the right medial. He has 3 remaining wounds on the left We have been using silver alginate  under 4-layer compression I have been trying to get him to be compliant with his external compression pumps 10/21; patient with 3 small wounds on the left leg and 1 on the right medial in the setting of severe lymphedema and chronic venous insufficiency. We have been using silver alginate under 4-layer compression he is using his external compression pumps twice a day 11/4; ARTERIAL STUDIES on the right show an ABI of 1.02 TBI of 0.858 with biphasic waveforms on the left 0.98 with a TBI of 0.55 and biphasic waveforms. Does not look like he has significant arterial disease. We are treating him for lymphedema he has compression pumps. He has punched-out areas on the left anterior left lateral and right medial lower extremities 11/11; after we obtained his arterial studies I put him in 4 layer compression. He is using his compression pumps probably once a day although I have asked him to do twice. Primary dressing to the wound is silver collagen he has severe lymphedema likely secondary to chronic venous insufficiency. Wounds on the left lateral, left medial and left anterior and a small area on the right medial 12/2; the area on the right anterior lower leg has healed. We initially thought that the area medially had healed as well however when her discharge nurse came in she detected fluid in the wound simply opened up. This is actually worse than I remember this pain. The area on the left lateral potentially slightly smaller He is also complaining about pain in his left hand he says that this is actually been getting some better he has been using topical creams on this. She asked that I look at this 12/9 after last weeks issues we have 2 wounds one on the right medial lower leg and 1 on the left lateral. Both of these are in the same condition. I think because of thickened skin secondary to chronic lymphedema these wounds actually have depth of almost 0.8 cm. 12/16; the patient has 2 small but deep  wounds one on the right medial and one on the left lateral. The right medial is actually the worst of these. He arrives in clinic today with absolutely terrible edema in the right leg apparently his 4-layer wrap fell down to just above his ankle he did not think about this he is apparently been continuing to use his compression pump twice a day. The left leg looks a lot better. 05/09/2020 upon evaluation today patient appears to be doing decently well in regard to his wounds. Everything is measuring smaller the right leg still has a little bit deeper wound in the left seems to be almost completely healed in my opinion I am very pleased in general with how things are progressing. He has a 4- layer compression wrap we have been using endoform today we will probably have to use collagen just based on the fact that we do not have endoform it is on order. 1/6; the patient's wound on the left lateral lower leg has healed. Still has 1 on the right medial. 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 Using compression pumps at home twice a day. 1/13; left lateral lower leg is still healed. He has a deep punched out rectangular shaped wound on the right medial calf. Looking down at this it appears that he is attempting to epithelialize around the  edges of the wound and on the base as well. His edema is reasonably well controlled we have been using collagen with absolutely no effect 1/20; left lateral lower leg remains closed he has extremitease stockings. The area on the right medial calf I aggressively debrided last week measures larger but the surface looks better. We have been using Hydrofera Blue. We ran Oasis through his insurance but we have not seen the results of this 1/27; left lower leg wound with chronic venous insufficiency and secondary lymphedema. I did aggressive debridement on this last week the wound seems to have come in healthy looking surface using Hydrofera Blue. He was  denied for Oasis 2/3; small divot in the right medial lower leg. Under illumination the walls of this divot are epithelialized however the base has slough which I removed with a curette we have been using Hydrofera Blue 2/10 small divot on the right medial lower leg pinpoint illumination at the base of this cone-shaped wound. We have been using Hydrofera Blue but I will switch to calcium alginate this week 2/17; the small divot on the right medial lower leg is fully epithelialized. There is no visible open area under illumination. He has his own stocking for the right leg similar to the one he has been wearing on the left. 03/26/2022; READMISSION This is a now 77 year old man that we had in the clinic from 02/23/2020 through 07/05/2020. At that point he had bilateral lower extremity wounds left greater than right in the setting of severe lymphedema. He had already obtained compression pumps ordered for him I think from the wound care clinic in [facility] so I do not really have record of what he has been using. He claims to be using them once a day but there is a problem with the sleeve on the left leg. [name], [name] ([id number]) [id number] Page 10 of 15 About 2 weeks ago he was hospitalized from 03/11/2022 through 03/14/2022 with diastolic congestive heart failure. 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 He was diuresed. Developed some prerenal azotemia and he has not been taking any diuretics currently. He has not been putting stockings on his legs since he got out of hospital and still has his legs dependent for long periods. Past medical history history of prostate cancer treated with prostatectomy and radiation this was apparently about 8 years ago, history of DVT on chronic Coumadin, history of lymphedema was managed for a while at the clinic in [place]. History of inguinal hernia repair in September 22,  hypertension, stage IIIb chronic renal failure ABIs today were noncompressible on the right 1.12 on the left 04-02-2022 upon evaluation today patient appears to be doing well currently in regard to his legs I do feel like both areas that are draining are actually much drier than they were in the picture last week although the left is drier than the right. He is tolerating the 4-layer compression wraps at this point he did contact the pump company and they are actually working on getting him a new compression sleeve for one of his legs which have previously popped and was not functioning properly. 04-23-2022 upon evaluation today patient appears to be doing well currently in regard to his wounds on the legs. I am actually very pleased with where things stand and I do feel like that we are headed in the right direction. Fortunately there is no sign of active infection locally or systemically at this time.  05-07-2022 upon evaluation today patient appears to be doing well currently in regard to his wounds in fact things are showing signs of improvement which is good news I do not see too much that actually appears to be open and I am very pleased in that regard. No fevers, chills, nausea, vomiting, or diarrhea. 05-21-2022 upon evaluation today patient appears to be doing somewhat poorly in regard to drainage of his lower extremities bilaterally. The right is greater than left as far as the weeping area. Nonetheless it seems to be getting worse not better. He actually has pitting edema which is at least 2+ to the thighs and I am concerned about the fact that he is may be fluid overloaded in general and that is the reason why we cannot get this under control. I know he is not using his pumps all the time because he actually told the nurse that he was either going to pump or he was going to use his fluid pills but not do both. For that reason I do think that he needs to be really doing both in order to get the  fluid out as effectively as possible obviously with the 4-layer compression wraps were doing as much as we can from a compression standpoint but it is really not enough. He tells me that he elevates his leg is much as he can in between pumping and other activity throughout the day. 05-28-2022 upon evaluation today patient appears to be doing better in regard to his wounds although the measurements may be a little bit larger this is a very difficult wound to heal it is very indistinct in a lot of areas. Nonetheless there is can be some need for sharp debridement in regard to both medial and lateral legs. Fortunately I see no signs of active infection locally nor systemically at this time. No fevers, chills, nausea, vomiting, or diarrhea. 06-04-2022 upon evaluation today patient appears to be doing poorly in general in regard to the wounds on his legs. He still continues to have a tremendous amount of fluid not just in the lower portion of his leg but to be honest his thigh where he has 2-3+ pitting edema in the thigh as well. Unfortunately I do not know that we will be able to get this healed effectively and keep it healed on the lower extremities unless he gets the overall fluid situation taking and under control. Fortunately I do not see any signs of infection locally nor systemically which is great news. He just seems to be very fluid overloaded. 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 He has been tolerating the dressing changes with the compression wraps without complication. Fortunately I do not see any evidence of infection at this time which is great news. No fevers, chills, nausea, vomiting, or diarrhea. 06-18-2022 upon evaluation today patient appears to be doing well currently in regard to his wounds as far as not looking like they are terribly infected but nonetheless I am  concerned about a subacute infection secondary to the fact that he continues to have spreading despite the compression therapy. We actually did do an Unna boot on him last week this is actually the first wrap that actually stayed up everything else has been sliding down quite significantly. Fortunately there does not appear to be any signs of infection systemically at this time. With that being said I do believe that locally there seems to  be an issue going on here and again I Ernie Hew do a PCR culture to see what that shows also think that I am going to put him on a broad-spectrum antibiotic, doxycycline to see how that will help as well. He does tell me that coming into the clinic today that he was feeling short of breath like "he was about to have a heart attack" because he was having such a hard time breathing. He says that he told this to Dr. Jodelle Green his cardiologist as well when he was evaluated in the past 1 to 2 weeks. 07-02-2022 upon evaluation today patient appears to be doing poorly currently in regard to his wound. He has been tolerating the dressing changes. Unfortunately he has not had any compression wraps on for the past week because he was unable to make it in for his appointment last week. With that being said he has a significant amount of drainage he tells me has been using his pumps but despite this in the pumps he still has been draining quite a bit. The drainage is also somewhat purulent unfortunately. We did attempt to get in touch with his cardiologist last week unfortunately we were unable to get up with him I did advise that the patient needs to get in touch with him upon leaving today in order to make sure they know he is on the new antibiotics I am going to send him this will be Levaquin and Augmentin. 07-09-2022 upon evaluation today patient appears to be doing about the same in regard to his legs he may have just a slight amount of improvement with regard to the drainage  probably [facility] topical antibiotics are helping in this regard to some degree. Fortunately there does not appear to be any signs of active infection systemically which is great news. No fevers, chills, nausea, vomiting, or diarrhea. 07-16-2022 upon evaluation today patient appears to be doing well currently in regard to his wound. He has been tolerating the dressing changes without complication. Fortunately there does not appear to be any signs of active infection locally nor systemically at this time. With that being said he cannot keep the wraps up he tells me on the left side he had to cut this down because it got too tight. He has been using his pumps but he is on the right side the wrap actually straight down causing some pushing around the central part of his leg just below the calf I think this is a bigger risk for him that help at this point. I think that we may need to try something different he should be getting his compression socks shortly he tells me they were ordered last Thursday. 3/6; ; this is a patient who lives in [place]. He has severe bilateral lymphedema. He has compression pumps, we have been using kerlix Ace wrap Keystone. He is changing the dressing. We do not have home health. 08-06-2022 upon evaluation today patient appears to be doing a little better in regard to his wounds in general at this point. Fortunately there does not appear to be any signs of active infection locally nor systemically at this time which is great news and overall I am extremely pleased with where we stand today. 08-13-2022 upon evaluation today patient appears to actually be doing significantly better compared to last week. He actually did go to the hospital I told him that he needed to when he left here and he actually did go. With that being said they actually  ended up admitting him he was having shortness of breath and I thought it might be related to congestive heart failure turns out he actually  had a pulmonary embolism. Subsequently they were able to get him off of the Coumadin switching over to Eliquis to get things stabilized in that regard they also had them wrapped and got his swelling under control on his legs he actually looks much better pretty much across the board at this point. I am very pleased in that regard. With that being said I am very happy that he finally went that could have been a very dangerous situation. 08-20-2022 upon evaluation today patient appears to be doing well currently in regard to his wound. Has been tolerating the dressing changes without complication. Fortunately there does not appear to be any signs of active infection locally nor systemically at this time. I think his legs are doing better there is some need for sharp debridement today. 08-27-2022 upon evaluation patient is actually making excellent progress. I am actually very pleased with where he stands and I think that he is moving in the right direction. In general I think that we are looking pretty good at the moment. 09-03-2022 upon evaluation today patient appears to be doing well currently in regard to his wound. He is actually tolerating dressing changes on the left and right leg without complication. Fortunately I do not see any need for debridement of the left leg the right leg I think we probably do some need to perform some debridement here. [name], [name] ([id number]) [id number] Page 11 of 15 09-10-2022 upon evaluation today patient's wounds actually showed signs of improvement in both legs I do not see much is going require debridement today which was great news. Fortunately I do not see any evidence of infection which I think is also excellent he seems to be using his pumps and doing everything right I am happy about how this is progressing at this point. 09-17-2022 upon evaluation today patient appears to be doing decently well in regard to his wounds. He has been  tolerating the dressing changes without complication. Fortunately there does not appear to be any signs of active infection at this time which is good news. 09-24-2022 upon evaluation today patient appears to be doing well currently in regard to his wounds. He has been tolerating the dressing changes without complication. Fortunately there does not appear to be any signs of active infection locally nor systemically which is great news. No fevers, chills, nausea, vomiting, or diarrhea. 10-08-2022 upon evaluation today patient appears to be doing excellent currently in regard to his wound. He has been tolerating the dressing changes without complication though it does not sound like he has been using his compression wraps for a bit here. He does think he was doing better with the [facility] topical antibiotics we can definitely go back to using that but I think the biggest issue here is that his swelling is just very out of control and needs to be under control. I discussed that with him today. 10-15-2022 upon evaluation today patient appears to be doing well currently in regard to his wounds. He is actually making some progress which is good news. Fortunately I do not see any evidence of active infection locally nor systemically which is great news as well. No fevers, chills, nausea, vomiting, or diarrhea. He does have a callused area on the plantar aspect of his left foot which is actually causing some pain and he wonders if I  can trim this down for him. 10-22-2022 upon evaluation today patient seems to be making progress. He is actually doing quite well and very pleased in that regard. I do not see any signs of active infection at this time. 11-05-2022 upon evaluation patient appears to be making progress although slowly towards closure. He seems to be doing well with regard to his legs were still using the [facility] topical antibiotics and he seems to be doing quite well. He is going require some sharp  debridement today. 11-19-2022 upon evaluation today patient unfortunately appears to be extremely swollen at this point. He tells me that he ran out of supplies he also tells me his leg started leaking more because of not having supplies he was unable to wear his compression wraps that is the juxta fit compression wraps. Therefore his legs are extremely swollen much larger than normal and do not appear to be doing well at all today. He is going require some debridement I also think he is going require [place] to perform compression wrapping today. 11-26-2022 upon evaluation today patient appears to be doing well currently in regard to his legs as far as infection is concerned I see nothing that appears to be infected. Fortunately I do not see any signs of active infection systemically either which is also good news. With that being said he still is extremely swollen as far as his legs are concerned. I do not see any signs of overall worsening but also do not see any signs of significant improvement which is the major issue here. 12-03-2022 upon evaluation today patient appears to be doing poorly still in regard to his legs the apparently has been taken off the wraps and not leaving the week by week. With that being said he has not had really the dressings to put on the release that is running out and subsequently though he is using his wraps I am not sure that he has been keeping them on like he needs to. I explained to him I really wanted to have the wraps that I put on him left on until he comes back to see me so that we can keep the compression under better control. He voiced understanding today he tells me that he was "confused about that". Nonetheless based on what I am seeing right now also think that he is up on his feet too much I think he needs to get the feet elevated. 7/24; this is a patient with severe bilateral stage III lymphedema. On the right lower leg medially is a large area of macerated  denuded skin was too small deeper areas in this. On the left leg he is 2 areas that are a little more standard in terms of wounds. Massive lymphedema bilaterally. He has compression pumps at home which he uses for 1 hour twice a day 12-17-2022 upon evaluation today patient's legs though a little bit smaller still continue to have significant issues with weeping here this just does not dry. I really think he needs to be changing this daily and this means we will probably have to try to go back to the Velcro wraps and give this a shot. I think may be going to the Velcro wraps, changing the dressing to a superabsorber like XtraSorb or the like, and have him elevate his legs and do his pumps 3 times per day is probably going to be the way to go to try to see if we can make some improvement here. 12-24-2022 upon  evaluation today patient unfortunately is still struggling to get anything under control. We have been extremely aggressive and trying to: Control his swelling everything we do however seems to be met with resistance. He tells me that he is pumping 3 times per day with his lymphedema pumps. He also tells me that he is try to elevate his legs is much as possible and that subsequently he has been keeping his compression wraps on. He goes back and forth between saying that he can keep our wraps on and not but right now I really do not see that he is doing well with the juxta fit is at least not from the standpoint of improving the overall appearance I think it does to some degree help maintain but we are still struggling here. I have voiced a concern to the patient that despite everything you are doing I am still not seeing a lot of improvement here and to be honest I am not sure what is can take get this under control he may have to go into the hospital for admission. 12-31-2022 upon evaluation today patient appears to be doing well currently in regard to his wounds. They seem a little better although he  has several pustules around the legs in general but has been concerned about infection. I am actually going to go ahead and see about getting an antibiotic sent into the pharmacy today although I am going to obtain a PCR culture as well. Objective Constitutional Well-nourished and well-hydrated in no acute distress. Vitals Time Taken: 3:32 PM, Height: 74 in, Weight: 250 lbs, BMI: 32.1, Temperature: 98.1 F, Pulse: 81 bpm, Respiratory Rate: 18 breaths/min, Blood Pressure: 162/56 mmHg. Respiratory normal breathing without difficulty. Psychiatric this patient is able to make decisions and demonstrates good insight into disease process. Alert and Oriented x 3. pleasant and cooperative. General Notes: Upon inspection patient's wounds again showed signs of pretty good improvement here but however he still has areas that have been a little bit concerned about the possibility of infection. For that reason I discussed with him that it may be good to go and put him on an antibiotic while we await the results of the culture and then we will proceed as planned following. Mcpheeters, [name] ([id number]) [id number] Page 12 of 15 Integumentary (Hair, Skin) Wound #11 status is Open. Original cause of wound was Gradually Appeared. The date acquired was: 10/19/2022. The wound has been in treatment 10 weeks. The wound is located on the Left,Posterior Lower Leg. The wound measures 0cm length x 0cm width x 0cm depth; 0cm^2 area and 0cm^3 volume. There is no tunneling or undermining noted. There is a medium amount of serous drainage noted. The wound margin is distinct with the outline attached to the wound base. There is no granulation within the wound bed. There is no necrotic tissue within the wound bed. The periwound skin appearance had no abnormalities noted for texture. The periwound skin appearance had no abnormalities noted for color. The periwound skin appearance exhibited: Dry/Scaly.  The periwound skin appearance did not exhibit: Maceration. Periwound temperature was noted as No Abnormality. Wound #12 status is Open. Original cause of wound was Frostbite. The date acquired was: 12/17/2022. The wound has been in treatment 2 weeks. The wound is located on the Left,Anterior Lower Leg. The wound measures 2.5cm length x 4cm width x 0.2cm depth; 7.854cm^2 area and 1.571cm^3 volume. There is Fat Layer (Subcutaneous Tissue) exposed. There is no tunneling or undermining noted. There is a large  amount of serous drainage noted. The wound margin is distinct with the outline attached to the wound base. There is small (1-33%) red granulation within the wound bed. There is a large (67-100%) amount of necrotic tissue within the wound bed including Adherent Slough. The periwound skin appearance exhibited: Hemosiderin Staining. The periwound skin appearance did not exhibit: Callus, Crepitus, Excoriation, Induration, Rash, Scarring, Dry/Scaly, Maceration, Atrophie Blanche, Cyanosis, Ecchymosis, Mottled, Pallor, Rubor, Erythema. Periwound temperature was noted as No Abnormality. Wound #13 status is Open. Original cause of wound was Gradually Appeared. The date acquired was: 12/24/2022. The wound has been in treatment 1 weeks. The wound is located on the Left,Lateral Lower Leg. The wound measures 0.5cm length x 1cm width x 0.1cm depth; 0.393cm^2 area and 0.039cm^3 volume. There is Fat Layer (Subcutaneous Tissue) exposed. There is no tunneling or undermining noted. There is a medium amount of serous drainage noted. The wound margin is flat and intact. There is small (1-33%) pink granulation within the wound bed. There is a large (67-100%) amount of necrotic tissue within the wound bed including Adherent Slough. The periwound skin appearance had no abnormalities noted for texture. The periwound skin appearance had no abnormalities noted for moisture. The periwound skin appearance exhibited: Hemosiderin  Staining. Periwound temperature was noted as No Abnormality. Wound #6 status is Open. Original cause of wound was Gradually Appeared. The date acquired was: 03/05/2022. The wound has been in treatment 40 weeks. The wound is located on the Right,Medial Lower Leg. The wound measures 12.5cm length x 10cm width x 0.4cm depth; 98.175cm^2 area and 39.27cm^3 volume. There is Fat Layer (Subcutaneous Tissue) exposed. There is no tunneling or undermining noted. There is a large amount of serous drainage noted. The wound margin is distinct with the outline attached to the wound base. There is no granulation within the wound bed. There is a large (67-100%) amount of necrotic tissue within the wound bed including Adherent Slough. The periwound skin appearance exhibited: Scarring, Hemosiderin Staining. The periwound skin appearance did not exhibit: Callus, Crepitus, Excoriation, Induration, Rash, Dry/Scaly, Maceration, Atrophie Blanche, Cyanosis, Ecchymosis, Mottled, Pallor, Rubor, Erythema. Periwound temperature was noted as No Abnormality. Wound #7 status is Open. Original cause of wound was Gradually Appeared. The date acquired was: 03/05/2022. The wound has been in treatment 40 weeks. The wound is located on the Left,Medial Lower Leg. The wound measures 4.5cm length x 5cm width x 0.3cm depth; 17.671cm^2 area and 5.301cm^3 volume. There is Fat Layer (Subcutaneous Tissue) exposed. There is no tunneling or undermining noted. There is a large amount of serous drainage noted. The wound margin is distinct with the outline attached to the wound base. There is no granulation within the wound bed. There is a large (67-100%) amount of necrotic tissue within the wound bed including Adherent Slough. The periwound skin appearance exhibited: Scarring, Hemosiderin Staining. The periwound skin appearance did not exhibit: Callus, Crepitus, Excoriation, Induration, Rash, Dry/Scaly, Maceration, Atrophie Blanche, Cyanosis,  Ecchymosis, Mottled, Pallor, Rubor, Erythema. Periwound temperature was noted as No Abnormality. 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 There was a Four Layer Compression Therapy Procedure by [name], RN. Post procedure  Diagnosis Wound #11: Same as Pre-Procedure Wound #12 Pre-procedure diagnosis of Wound #12 is a Lymphedema located on the Left,Anterior Lower Leg . There was a Four Layer Compression Therapy Procedure by [name], RN. Post procedure Diagnosis Wound #12: Same as Pre-Procedure Wound #13 Pre-procedure diagnosis of Wound #13 is a Lymphedema located on the Left,Lateral Lower Leg . There was a Four Layer Compression Therapy Procedure by [name], RN. Post procedure Diagnosis Wound #13: Same as Pre-Procedure Wound #6 Pre-procedure diagnosis of Wound #6 is a Lymphedema located on the Right,Medial Lower Leg . There was a Four Layer Compression Therapy Procedure by [name], RN. Post procedure Diagnosis Wound #6: Same as Pre-Procedure Wound #7 Pre-procedure diagnosis of Wound #7 is a Lymphedema located on the Left,Medial Lower Leg . There was a Four Layer Compression Therapy Procedure by [name], RN. Post procedure Diagnosis Wound #7: Same as Pre-Procedure [name], [name] ([id number]) [id number] Page 13 of 15 A Paring/cutting 1 benign hyperkeratotic lesion procedure was performed. by [name], PA. Post procedure Diagnosis Wound #: Same as Pre-Procedure Notes: Right  Calcaneus-patient c/o pain at this site Plan Follow-up Appointments: Return Appointment in 1 week. [name] PA Wednesday ****extra time 60 minutes**** 01/07/23 at 2pm Return Appointment in 2 weeks. [name] PA 01/14/23 at 3pm Return appointment in 3 weeks. Leonard Schwartz Wed.01/21/23 at 3pm Return appointment in 1 month. - Please see front desk to schedule appointment Other: - Please ask your Primary Care Practioner or Foot Doctor to order orthotic shoes. Anesthetic: (In clinic) Topical Lidocaine 5% applied to wound bed Bathing/ Shower/ Hygiene: May shower with protection but do not get wound dressing(s) wet. Protect dressing(s) with water repellant cover (for example, large plastic bag) or a cast cover and may then take shower. Edema Control - Lymphedema / SCD / Other: Lymphedema Pumps. Use Lymphedema pumps on leg(s) 2-3 times a day for 45-60 minutes. If wearing any wraps or hose, do not remove them. Continue exercising as instructed. - *****pump 3 times a day for an hour each time.***** Elevate legs to the level of the heart or above for 30 minutes daily and/or when sitting for 3-4 times a day throughout the day. - +++ Important+++ ELEVATE THE LEGS>>>>>>>>>> Avoid standing for long periods of time. Exercise regularly Compression stocking or Garment 30-40 mm/Hg pressure to: - use juxtafits to both legs daily if compression wraps removed Other Edema Control Orders/Instructions: - When sitting please keep your feet up, keep legs at Heart Level or above. Use pillows behind calf to for comfort. Off-Loading: Other: - When sitting please keep your feet up, keep legs at Heart Level or above. Use pillows behind calf to for comfort. Additional Orders / Instructions: Follow Nutritious Diet - Try and increase protein intake. Goal of protein intake is 60g-100g. Laboratory ordered were: Anaerobic culture - PCR swab from Right Medial Lower leg The following medication(s) was prescribed: levofloxacin oral  500 mg tablet 1 1 tablet oral once daily x 14 days starting 12/31/2022 WOUND #11: - Lower Leg Wound Laterality: Left, Posterior Cleanser: Soap and Water 1 x Per Week/30 Days Discharge Instructions: May shower and wash wound with dial antibacterial soap and water prior to dressing change. Peri-Wound Care: Sween Lotion (Moisturizing lotion) 1 x Per Week/30 Days Discharge Instructions: Apply moisturizing lotion as directed Topical: Keystone antibiotic spray 1 x Per Week/30 Days Prim Dressing: Maxorb Extra Ag+ Alginate Dressing, 4x4.75 (in/in) 1 x Per Week/30 Days ary Discharge Instructions: Apply to wound bed  as instructed Secondary Dressing: ABD Pad, 8x10 1 x Per Week/30 Days Discharge Instructions: Apply over primary dressing as directed. Secondary Dressing: OptiLock Super Absorbent, 5x5.5 (in/in) (Generic) 1 x Per Week/30 Days Discharge Instructions: Apply directly to wound bed as directed Com pression Wrap: Urgo K2, (equivalent to a 4 layer) two layer compression system, regular 1 x Per Week/30 Days Discharge Instructions: Apply Urgo K2 as directed (alternative to 4 layer compression). WOUND #12: - Lower Leg Wound Laterality: Left, Anterior Cleanser: Soap and Water 1 x Per Week/30 Days Discharge Instructions: May shower and wash wound with dial antibacterial soap and water prior to dressing change. Peri-Wound Care: Sween Lotion (Moisturizing lotion) 1 x Per Week/30 Days Discharge Instructions: Apply moisturizing lotion as directed Topical: Keystone antibiotic spray 1 x Per Week/30 Days Prim Dressing: Maxorb Extra Ag+ Alginate Dressing, 4x4.75 (in/in) 1 x Per Week/30 Days ary Discharge Instructions: Apply to wound bed as instructed Secondary Dressing: ABD Pad, 8x10 1 x Per Week/30 Days Discharge Instructions: Apply over primary dressing as directed. Secondary Dressing: OptiLock Super Absorbent, 5x5.5 (in/in) (Generic) 1 x Per Week/30 Days Discharge Instructions: Apply directly to wound bed  as directed Com pression Wrap: Urgo K2, (equivalent to a 4 layer) two layer compression system, regular 1 x Per Week/30 Days Discharge Instructions: Apply Urgo K2 as directed (alternative to 4 layer compression). WOUND #13: - Lower Leg Wound Laterality: Left, Lateral Cleanser: Soap and Water 1 x Per Week/30 Days Discharge Instructions: May shower and wash wound with dial antibacterial soap and water prior to dressing change. Peri-Wound Care: Sween Lotion (Moisturizing lotion) 1 x Per Week/30 Days Discharge Instructions: Apply moisturizing lotion as directed Topical: Keystone antibiotic spray 1 x Per Week/30 Days Prim Dressing: Maxorb Extra Ag+ Alginate Dressing, 4x4.75 (in/in) 1 x Per Week/30 Days ary Discharge Instructions: Apply to wound bed as instructed Secondary Dressing: ABD Pad, 8x10 1 x Per Week/30 Days Discharge Instructions: Apply over primary dressing as directed. Secondary Dressing: OptiLock Super Absorbent, 5x5.5 (in/in) (Generic) 1 x Per Week/30 Days Discharge Instructions: Apply directly to wound bed as directed Com pression Wrap: Urgo K2, (equivalent to a 4 layer) two layer compression system, regular 1 x Per Week/30 Days Discharge Instructions: Apply Urgo K2 as directed (alternative to 4 layer compression). WOUND #6: - Lower Leg Wound Laterality: Right, Medial Cleanser: Soap and Water 1 x Per Week/30 Days Discharge Instructions: May shower and wash wound with dial antibacterial soap and water prior to dressing change. Peri-Wound Care: Sween Lotion (Moisturizing lotion) 1 x Per Week/30 Days Discharge Instructions: Apply moisturizing lotion as directed Topical: Keystone antibiotic spray 1 x Per Week/30 Days Prim Dressing: Maxorb Extra Ag+ Alginate Dressing, 4x4.75 (in/in) 1 x Per Week/30 Days ary Discharge Instructions: Apply to wound bed as instructed Secondary Dressing: ABD Pad, 8x10 1 x Per Week/30 Days Discharge Instructions: Apply over primary dressing as  directed. Secondary Dressing: OptiLock Super Absorbent, 5x5.5 (in/in) (Generic) 1 x Per Week/30 Days Bernet, Benford ([id number]) [id number] Page 14 of 15 Discharge Instructions: Apply directly to wound bed as directed Com pression Wrap: Urgo K2, (equivalent to a 4 layer) two layer compression system, regular 1 x Per Week/30 Days Discharge Instructions: Apply Urgo K2 as directed (alternative to 4 layer compression). WOUND #7: - Lower Leg Wound Laterality: Left, Medial Cleanser: Soap and Water 1 x Per Week/30 Days Discharge Instructions: May shower and wash wound with dial antibacterial soap and water prior to dressing change. Peri-Wound Care: Sween Lotion (Moisturizing lotion) 1 x Per Week/30  Days Discharge Instructions: Apply moisturizing lotion as directed Topical: Keystone antibiotic spray 1 x Per Week/30 Days Prim Dressing: Maxorb Extra Ag+ Alginate Dressing, 4x4.75 (in/in) 1 x Per Week/30 Days ary Discharge Instructions: Apply to wound bed as instructed Secondary Dressing: ABD Pad, 8x10 1 x Per Week/30 Days Discharge Instructions: Apply over primary dressing as directed. Secondary Dressing: OptiLock Super Absorbent, 5x5.5 (in/in) (Generic) 1 x Per Week/30 Days Discharge Instructions: Apply directly to wound bed as directed Com pression Wrap: Urgo K2, (equivalent to a 4 layer) two layer compression system, regular 1 x Per Week/30 Days Discharge Instructions: Apply Urgo K2 as directed (alternative to 4 layer compression). 1. Upon evaluation today patient appears to be doing poorly still in regard to his legs he is less swollen than last week but at the same time he does have evidence that there may be some cellulitis in fact he had some pustules scattered over the lower extremities bilaterally. 2. I am going to go ahead and send in a prescription for Levaquin today. Depend on results of the culture will make any adjustments as necessary. 3. I did obtain a PCR  culture today that we will send and see what that shows he is in agreement with that plan. 4. I did perform a paring of callus/corn on the plantar aspect of the heel this was so that we could remove some of this overgrowth that was actually causing him a tremendous amount of pain when walking. He tolerated this today without complication and post debridement/paring the area actually appear to be doing significantly better. We will see patient back for reevaluation in 1 week here in the clinic. If anything worsens or changes patient will contact our office for additional recommendations. Electronic Signature(s) Signed: 12/31/2022 5:03:42 PM By: [name] PA-C Entered By: [name] on 12/31/2022 14:03:41 -------------------------------------------------------------------------------- SuperBill Details Patient Name: Date of Service: Gerke, [name] 12/31/2022 Medical Record Number: [id number] Patient Account Number: [id number] Date of Birth/Sex: Treating RN: 08/03/1945 (77 y.o. 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 ICD-10 Diagnosis Description 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.818  Non-pressure chronic ulcer of other  part of right lower leg with other specified severity I89.0 Lymphedema, not elsewhere classified Modifier: Quantity: 1 Physician Procedures : CPT4 Code Description Modifier [id number] - WC PHYS LEVEL 4 - EST PT Bin, Mahin ([id number]) [id number] Page ICD-10 Diagnosis Description [phone] Chronic venous hypertension (idiopathic) with ulcer and inflammation  of bilateral lower extremity I89.0 Lymphedema, not elsewhere classified 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

## 2023-01-02 NOTE — Progress Notes (Signed)
[name] ([id number]) [id number] Page 1 of 14 Visit Report for 12/31/2022 Arrival Information Details Patient Name: Date of Service: [name], [name] 12/31/2022 3:00 PM Medical Record Number: [id number] Patient Account Number: [id number] Date of Birth/Sex: Treating RN: 28-Feb-1946 (77 y.o. M) Primary Care Keren Alverio: [name] Other Clinician: Referring Janeva Peaster: Treating Ivoree Felmlee/Extender: [name] in Treatment: 40 Visit Information History Since Last Visit Added or deleted any medications: No Patient Arrived: Cane Any new allergies or adverse reactions: No Arrival Time: 15:31 Had a fall or experienced change in No Accompanied By: wife activities of daily living that may affect Transfer Assistance: None risk of falls: Patient Identification Verified: Yes Signs or symptoms of abuse/neglect since last visito No Secondary Verification Process Completed: Yes Hospitalized since last visit: No Patient Requires Transmission-Based Precautions: No Implantable device outside of the clinic excluding No Patient Has Alerts: Yes cellular tissue based products placed in the center Patient Alerts: Patient on Blood Thinner since last visit: Right ABI in clinic [place] Has Dressing in Place as Prescribed: Yes Has Compression in Place as Prescribed: Yes Pain Present Now: Yes Electronic Signature(s) Signed: 01/02/2023 12:35:38 PM By: Thayer Dallas Entered By: Thayer Dallas on 12/31/2022 15:31:57 -------------------------------------------------------------------------------- Compression Therapy Details Patient Name: Date of Service: [name] 12/31/2022 3:00 PM Medical Record Number: [id number] Patient Account Number: [id number] Date of Birth/Sex: Treating RN: 12-06-1945 (77 y.o. [name] Primary Care Koby Pickup: [name] Other Clinician: Referring Najwa Spillane: Treating Alizea Pell/Extender: [name] Weeks in Treatment: 40 Compression Therapy Performed for Wound Assessment: Wound #11 Left,Posterior Lower [name], RN Compression Type: Four Layer Post Procedure Diagnosis Same as Pre-procedure Electronic Signature(s) Signed: 12/31/2022 5:15:28 PM By: [name] RN Entered By: [name] on 12/31/2022 17:01:20 [place], Dannielle [name] ([id number]) [id number] Page 2 of 14 -------------------------------------------------------------------------------- Compression Therapy Details Patient Name: Date of Service: [name] 12/31/2022 3:00 PM Medical Record Number: [id number] Patient Account Number: [id number] Date of Birth/Sex: Treating RN: 26-Jul-1945 (77 y.o. [name] Primary Care Lynwood Kubisiak: [name] Other Clinician: Referring Analie Katzman: Treating Satoshi Kalas/Extender: [name] Weeks in Treatment: 40 Compression Therapy Performed for Wound Assessment: Wound #12 Left,Anterior Lower [name], RN Compression Type: Four Layer Post Procedure Diagnosis Same as Pre-procedure Electronic Signature(s) Signed: 12/31/2022 5:15:28 PM By: [name] RN Entered By: [name] on 12/31/2022 17:01:20 -------------------------------------------------------------------------------- Compression Therapy Details Patient Name: Date of Service: [name] 12/31/2022 3:00 PM Medical Record Number: [id number] Patient Account Number: [id number] Date of Birth/Sex: Treating RN: 25-May-1945 (77 y.o. [name] Primary Care Maitlyn Penza: [name] Other Clinician: Referring Bradden Tadros: Treating Zerrick Hanssen/Extender: [name] Weeks in Treatment: 40 Compression Therapy Performed for Wound Assessment: Wound #13 Left,Lateral Lower [name], RN Compression Type: Four Layer Post Procedure  Diagnosis Same as Pre-procedure Electronic Signature(s) Signed: 12/31/2022 5:15:28 PM By: [name] RN Entered By: [name] on 12/31/2022 17:01:20 -------------------------------------------------------------------------------- Compression Therapy Details Patient Name: Date of Service: [name] 12/31/2022 3:00 PM Medical Record Number: [id number] Patient Account Number: [id number] Date of Birth/Sex: Treating RN: [birth year] (77 y.o. [name] Primary Care Kay Ricciuti: [name] Other Clinician: Referring Yulisa Chirico: Treating Keslyn Teater/Extender: [name] Weeks in Treatment: 40 Compression Therapy Performed for Wound Assessment: Wound #6 Right,Medial Lower [name], RN Compression Type: Four Layer Post Procedure Diagnosis  Same as Pre-procedure Electronic Signature(s) Signed: 12/31/2022 5:15:28 PM By: [name] RN Swatek, Dannielle [name] ([id number]) PM By: [name] RN [id number].pdf Page 3 of 14 Signed: 12/31/2022 5:15:28 Entered By: [name] on 12/31/2022 17:01:20 -------------------------------------------------------------------------------- Compression Therapy Details Patient Name: Date of Service: [name] 12/31/2022 3:00 PM Medical Record Number: [id number] Patient Account Number: [id number] Date of Birth/Sex: Treating RN: 1946/01/03 (77 y.o. [name] Primary Care Praneel Haisley: [name] Other Clinician: Referring Cindia Hustead: Treating Raylin Diguglielmo/Extender: [name] Weeks in Treatment: 40 Compression Therapy Performed for Wound Assessment: Wound #7 Left,Medial Lower [name], RN Compression Type: Four Layer Post Procedure Diagnosis Same as Pre-procedure Electronic Signature(s) Signed: 12/31/2022 5:15:28 PM By: [name] RN Entered By: [name] on 12/31/2022  17:01:20 -------------------------------------------------------------------------------- Encounter Discharge Information Details Patient Name: Date of Service: [name] 12/31/2022 3:00 PM Medical Record Number: [id number] Patient Account Number: [id number] Date of Birth/Sex: Treating RN: Dec 11, 1945 (77 y.o. [name] Primary Care Sarai January: [name] Other Clinician: Referring Tayven Renteria: Treating Jaymien Landin/Extender: [name] Weeks in Treatment: 33 Encounter Discharge Information Items Discharge Condition: Stable Ambulatory Status: Walker Discharge Destination: Home Transportation: Private Auto Accompanied By: wife Schedule Follow-up Appointment: Yes Clinical Summary of Care: Patient Declined Electronic Signature(s) Signed: 12/31/2022 5:15:28 PM By: [name] RN Entered By: [name] on 12/31/2022 17:03:25 -------------------------------------------------------------------------------- Lower Extremity Assessment Details Patient Name: Date of Service: Madariaga, DA [name] 12/31/2022 3:00 PM Medical Record Number: [id number] Patient Account Number: [id number] Date of Birth/Sex: Treating RN: [birth year] (77 y.o. M) Primary Care Eliaz Fout: [name] Other Clinician: Nix, Dannielle [name] ([id number]) [id number] Page 4 of 14 Referring Katyana Trolinger: Treating Hildegard Hlavac/Extender: Leveda Anna, [name] Weeks in Treatment: 40 Edema Assessment Assessed: [Left: No] [Right: No] Edema: [Left: Yes] [Right: Yes] Calf Left: Right: Point of Measurement: 40 cm From Medial Instep 56 cm 59 cm Ankle Left: Right: Point of Measurement: 13 cm From Medial Instep 39 cm 39 cm Vascular Assessment Extremity colors, hair growth, and conditions: Extremity Color: [Left:Hyperpigmented] [Right:Hyperpigmented] Hair Growth on Extremity: [Left:No] Temperature of Extremity: [Left:Warm] [Right:Warm] Capillary Refill: [Left:> 3  seconds] [Right:> 3 seconds] Dependent Rubor: [Left:No Yes] [Right:No Yes] Electronic Signature(s) Signed: 01/02/2023 12:35:38 PM By: Thayer Dallas Entered By: Thayer Dallas on 12/31/2022 15:33:05 -------------------------------------------------------------------------------- Multi-Disciplinary Care Plan Details Patient Name: Date of Service: [place], DA [name] 12/31/2022 3:00 PM Medical Record Number: [id number] Patient Account Number: [id number] Date of Birth/Sex: Treating RN: 14-Feb-1946 (77 y.o. [name] Primary Care Thornton Dohrmann: [name] Other Clinician: Referring Trynity Skousen: Treating Bobby Ragan/Extender: [name] in Treatment: 40 Multidisciplinary Care Plan reviewed with physician Active Inactive Wound/Skin Impairment Nursing Diagnoses: Impaired tissue integrity Knowledge deficit related to ulceration/compromised skin integrity Goals: Patient will have a decrease in wound volume by X% from date: (specify in notes) Date Initiated: 03/26/2022 Target Resolution Date: 04/18/2023 Goal Status: Active Patient/caregiver will verbalize understanding of skin care regimen Date Initiated: 03/26/2022 Target Resolution Date: 04/18/2023 Goal Status: Active Ulcer/skin breakdown will have a volume reduction of 30% by week 4 Date Initiated: 03/26/2022 Date Inactivated: 05/21/2022 Target Resolution Date: 05/17/2022 Unmet Reason: see wound Goal Status: Unmet measurement. Ulcer/skin breakdown will have a volume reduction of 50% by week 8 Date Initiated: 03/26/2022 Date Inactivated: 05/21/2022 Target Resolution Date: 05/17/2022 Unmet Reason: see wound Goal Status: Unmet measurement. Musson, Dannielle [name] ([id number]) [id number] Page 5 of 14 Interventions: Assess patient/caregiver ability to obtain necessary supplies Assess patient/caregiver ability  to perform ulcer/skin care regimen upon admission and as needed Assess ulceration(s)  every visit Notes: Patient stated today, "I will take my fluid pill or pump not do both." Milika Ventress made aware. Electronic Signature(s) Signed: 12/31/2022 5:15:28 PM By: [name] RN Entered By: [name] on 12/31/2022 17:00:03 -------------------------------------------------------------------------------- Pain Assessment Details Patient Name: Date of Service: Weinberg, DA [name] 12/31/2022 3:00 PM Medical Record Number: [id number] Patient Account Number: [id number] Date of Birth/Sex: Treating RN: Sep 12, 1945 (77 y.o. M) Primary Care Lamar Naef: [name] Other Clinician: Referring Makeshia Seat: Treating Kazmir Oki/Extender: [name] in Treatment: 68 Active Problems Location of Pain Severity and Description of Pain Patient Has Paino Yes Site Locations Pain Location: Generalized Pain, Pain in Ulcers Rate the pain. Current Pain Level: 8 Pain Management and Medication Current Pain Management: Electronic Signature(s) Signed: 01/02/2023 12:35:38 PM By: Thayer Dallas Entered By: Thayer Dallas on 12/31/2022 15:32:40 -------------------------------------------------------------------------------- Patient/Caregiver Education Details Patient Name: Date of Service: Mctighe, DA [name] 8/14/2024andnbsp3:00 PM Medical Record Number: [id number] Patient Account Number: [id number] Date of Birth/Gender: Treating RN: 11-08-1945 (77 y.o. [name] Primary Care Physician: [name] Other Clinician: Ortloff, Dannielle [name] ([id number]) [id number] Page 6 of 14 Referring Physician: Treating Physician/Extender: [name] in Treatment: 41 Education Assessment Education Provided To: Patient Education Topics Provided Wound/Skin Impairment: Methods: Demonstration, Explain/Verbal Responses: See progress note Electronic Signature(s) Signed: 12/31/2022 5:15:28 PM By: [name] RN Entered By: [name] on 12/31/2022 17:00:29 -------------------------------------------------------------------------------- Wound Assessment Details Patient Name: Date of Service: Overturf, DA [name] 12/31/2022 3:00 PM Medical Record Number: [id number] Patient Account Number: [id number] Date of Birth/Sex: Treating RN: Dec 18, 1945 (77 y.o. M) Primary Care Mory Herrman: [name] Other Clinician: Referring Quenisha Lovins: Treating Jenavie Stanczak/Extender: Leveda Anna, [name] Weeks in Treatment: 40 Wound Status Wound Number: 11 Primary Lymphedema Etiology: Wound Location: Left, Posterior Lower Leg Wound Open Wounding Event: Gradually Appeared Status: Date Acquired: 10/19/2022 Comorbid Anemia, Lymphedema, Deep Vein Thrombosis, Hypertension, Weeks Of Treatment: 10 History: Received Radiation Clustered Wound: No Photos Wound Measurements Length: (cm) Width: (cm) Depth: (cm) Area: (cm) Volume: (cm) 0 % Reduction in Area: 100% 0 % Reduction in Volume: 100% 0 Epithelialization: Large (67-100%) 0 Tunneling: No 0 Undermining: No Wound Description Classification: Full Thickness Without Exposed Support Wound Margin: Distinct, outline attached Exudate Amount: Medium Exudate Type: Serous Exudate Color: amber Ratto, Marvie ([id number]) Wound Bed Granulation Amount: None Present (0 Necrotic Amount: None Present (0 Structures Foul Odor After Cleansing: No Slough/Fibrino No [id number] Page 7 of 14 %) Exposed Structure %) Fascia Exposed: No Fat Layer (Subcutaneous Tissue) Exposed: No Tendon Exposed: No Muscle Exposed: No Joint Exposed: No Bone Exposed: No Periwound Skin Texture Texture Color No Abnormalities Noted: Yes No Abnormalities Noted: Yes Moisture Temperature / Pain No Abnormalities Noted: No Temperature: No Abnormality Dry / Scaly: Yes Maceration: No Treatment Notes Wound #11 (Lower Leg) Wound Laterality: Left, Posterior Cleanser Soap and  Water Discharge Instruction: May shower and wash wound with dial antibacterial soap and water prior to dressing change. Peri-Wound Care Sween Lotion (Moisturizing lotion) Discharge Instruction: Apply moisturizing lotion as directed Topical Keystone antibiotic spray Primary Dressing Maxorb Extra Ag+ Alginate Dressing, 4x4.75 (in/in) Discharge Instruction: Apply to wound bed as instructed Secondary Dressing ABD Pad, 8x10 Discharge Instruction: Apply over primary dressing as directed. OptiLock Super Absorbent, 5x5.5 (in/in) Discharge Instruction: Apply directly to wound bed as directed Secured With Compression Wrap Urgo K2, (equivalent to a 4 layer) two layer compression system, regular  Discharge Instruction: Apply Urgo K2 as directed (alternative to 4 layer compression). Compression Stockings Add-Ons Electronic Signature(s) Signed: 01/02/2023 12:35:38 PM By: Thayer Dallas Entered By: Thayer Dallas on 12/31/2022 15:34:20 -------------------------------------------------------------------------------- Wound Assessment Details Patient Name: Date of Service: Derksen, DA [name] 12/31/2022 3:00 PM Medical Record Number: [id number] Patient Account Number: [id number] Date of Birth/Sex: Treating RN: [birth year] (77 y.o. M) Primary Care Montoya Brandel: [name] Other Clinician: Referring Sherrey North: Treating Bralon Antkowiak/Extender: Leveda Anna, [name] Weeks in Treatment: 40 Wound Status Cerami, Dannielle [name] ([id number]) [id number] Page 8 of 14 Wound Number: 12 Primary Lymphedema Etiology: Wound Location: Left, Anterior Lower Leg Wound Open Wounding Event: Frostbite Status: Date Acquired: 12/17/2022 Comorbid Anemia, Lymphedema, Deep Vein Thrombosis, Hypertension, Weeks Of Treatment: 2 History: Received Radiation Clustered Wound: No Photos Wound Measurements Length: (cm) 2.5 Width: (cm) 4 Depth: (cm) 0.2 Area: (cm) 7.854 Volume: (cm) 1.571 % Reduction  in Area: -122.2% % Reduction in Volume: -122.2% Epithelialization: Small (1-33%) Tunneling: No Undermining: No Wound Description Classification: Full Thickness Without Exposed Suppor Wound Margin: Distinct, outline attached Exudate Amount: Large Exudate Type: Serous Exudate Color: amber t Structures Foul Odor After Cleansing: No Slough/Fibrino Yes Wound Bed Granulation Amount: Small (1-33%) Exposed Structure Granulation Quality: Red Fascia Exposed: No Necrotic Amount: Large (67-100%) Fat Layer (Subcutaneous Tissue) Exposed: Yes Necrotic Quality: Adherent Slough Tendon Exposed: No Muscle Exposed: No Joint Exposed: No Bone Exposed: No Periwound Skin Texture Texture Color No Abnormalities Noted: No No Abnormalities Noted: No Callus: No Atrophie Blanche: No Crepitus: No Cyanosis: No Excoriation: No Ecchymosis: No Induration: No Erythema: No Rash: No Hemosiderin Staining: Yes Scarring: No Mottled: No Pallor: No Moisture Rubor: No No Abnormalities Noted: No Dry / Scaly: No Temperature / Pain Maceration: No Temperature: No Abnormality Treatment Notes Wound #12 (Lower Leg) Wound Laterality: Left, Anterior Cleanser Soap and Water Discharge Instruction: May shower and wash wound with dial antibacterial soap and water prior to dressing change. Peri-Wound Care Sween Lotion (Moisturizing lotion) Discharge Instruction: Apply moisturizing lotion as directed Topical Keystone antibiotic spray Primary Dressing Schnorr, Dannielle [name] ([id number]) [id number] Page 9 of 14 Maxorb Extra Ag+ Alginate Dressing, 4x4.75 (in/in) Discharge Instruction: Apply to wound bed as instructed Secondary Dressing ABD Pad, 8x10 Discharge Instruction: Apply over primary dressing as directed. OptiLock Super Absorbent, 5x5.5 (in/in) Discharge Instruction: Apply directly to wound bed as directed Secured With Compression Wrap Urgo K2, (equivalent to a 4 layer) two layer  compression system, regular Discharge Instruction: Apply Urgo K2 as directed (alternative to 4 layer compression). Compression Stockings Add-Ons Electronic Signature(s) Signed: 01/02/2023 12:35:38 PM By: Thayer Dallas Entered By: Thayer Dallas on 12/31/2022 15:34:49 -------------------------------------------------------------------------------- Wound Assessment Details Patient Name: Date of Service: Goertz, DA [name] 12/31/2022 3:00 PM Medical Record Number: [id number] Patient Account Number: [id number] Date of Birth/Sex: Treating RN: Jul 15, 1945 (77 y.o. M) Primary Care Katty Fretwell: [name] Other Clinician: Referring Zalayah Pizzuto: Treating Trenace Coughlin/Extender: Leveda Anna, [name] Weeks in Treatment: 40 Wound Status Wound Number: 13 Primary Lymphedema Etiology: Wound Location: Left, Lateral Lower Leg Wound Open Wounding Event: Gradually Appeared Status: Date Acquired: 12/24/2022 Comorbid Anemia, Lymphedema, Deep Vein Thrombosis, Hypertension, Weeks Of Treatment: 1 History: Received Radiation Clustered Wound: No Photos Wound Measurements Length: (cm) 0.5 Width: (cm) 1 Depth: (cm) 0.1 Area: (cm) 0.393 Volume: (cm) 0.039 % Reduction in Area: 67.5% % Reduction in Volume: 67.8% Epithelialization: Small (1-33%) Tunneling: No Undermining: No Wound Description Classification: Full Thickness Without Exposed Support Structures Wound Margin: Flat and Intact Exudate Amount: Medium Exudate Type: Serous Anzalone, Sair (  [id number]) Exudate Color: amber Foul Odor After Cleansing: No Slough/Fibrino Yes [id number] Page 10 of 14 Wound Bed Granulation Amount: Small (1-33%) Exposed Structure Granulation Quality: Pink Fascia Exposed: No Necrotic Amount: Large (67-100%) Fat Layer (Subcutaneous Tissue) Exposed: Yes Necrotic Quality: Adherent Slough Tendon Exposed: No Muscle Exposed: No Joint Exposed: No Bone Exposed: No Periwound Skin  Texture Texture Color No Abnormalities Noted: Yes No Abnormalities Noted: No Hemosiderin Staining: Yes Moisture No Abnormalities Noted: Yes Temperature / Pain Temperature: No Abnormality Treatment Notes Wound #13 (Lower Leg) Wound Laterality: Left, Lateral Cleanser Soap and Water Discharge Instruction: May shower and wash wound with dial antibacterial soap and water prior to dressing change. Peri-Wound Care Sween Lotion (Moisturizing lotion) Discharge Instruction: Apply moisturizing lotion as directed Topical Keystone antibiotic spray Primary Dressing Maxorb Extra Ag+ Alginate Dressing, 4x4.75 (in/in) Discharge Instruction: Apply to wound bed as instructed Secondary Dressing ABD Pad, 8x10 Discharge Instruction: Apply over primary dressing as directed. OptiLock Super Absorbent, 5x5.5 (in/in) Discharge Instruction: Apply directly to wound bed as directed Secured With Compression Wrap Urgo K2, (equivalent to a 4 layer) two layer compression system, regular Discharge Instruction: Apply Urgo K2 as directed (alternative to 4 layer compression). Compression Stockings Add-Ons Electronic Signature(s) Signed: 01/02/2023 12:35:38 PM By: Thayer Dallas Entered By: Thayer Dallas on 12/31/2022 15:35:32 -------------------------------------------------------------------------------- Wound Assessment Details Patient Name: Date of Service: Schrier, DA [name] 12/31/2022 3:00 PM Medical Record Number: [id number] Patient Account Number: [id number] Date of Birth/Sex: Treating RN: 01-15-1946 (77 y.o. M) Primary Care Analicia Skibinski: [name] Other Clinician: Referring Kamran Coker: Treating Oshay Stranahan/Extender: Leveda Anna, Claria Dice in Treatment: 40 Emel, Dannielle [name] ([id number]) [id number] Page 11 of 14 Wound Status Wound Number: 6 Primary Lymphedema Etiology: Wound Location: Right, Medial Lower Leg Wound Open Wounding Event: Gradually  Appeared Status: Date Acquired: 03/05/2022 Comorbid Anemia, Lymphedema, Deep Vein Thrombosis, Hypertension, Weeks Of Treatment: 40 History: Received Radiation Clustered Wound: Yes Photos Wound Measurements Length: (cm) Width: (cm) Depth: (cm) Clustered Quantity: Area: (cm) Volume: (cm) 12.5 % Reduction in Area: 31.3% 10 % Reduction in Volume: -174.7% 0.4 Epithelialization: None 1 Tunneling: No 98.175 Undermining: No 39.27 Wound Description Classification: Full Thickness Without Exposed Sup Wound Margin: Distinct, outline attached Exudate Amount: Large Exudate Type: Serous Exudate Color: amber port Structures Foul Odor After Cleansing: No Slough/Fibrino Yes Wound Bed Granulation Amount: None Present (0%) Exposed Structure Necrotic Amount: Large (67-100%) Fascia Exposed: No Necrotic Quality: Adherent Slough Fat Layer (Subcutaneous Tissue) Exposed: Yes Tendon Exposed: No Muscle Exposed: No Joint Exposed: No Bone Exposed: No Periwound Skin Texture Texture Color No Abnormalities Noted: No No Abnormalities Noted: No Callus: No Atrophie Blanche: No Crepitus: No Cyanosis: No Excoriation: No Ecchymosis: No Induration: No Erythema: No Rash: No Hemosiderin Staining: Yes Scarring: Yes Mottled: No Pallor: No Moisture Rubor: No No Abnormalities Noted: No Dry / Scaly: No Temperature / Pain Maceration: No Temperature: No Abnormality Treatment Notes Wound #6 (Lower Leg) Wound Laterality: Right, Medial Cleanser Soap and Water Discharge Instruction: May shower and wash wound with dial antibacterial soap and water prior to dressing change. Peri-Wound Care Sween Lotion (Moisturizing lotion) Discharge Instruction: Apply moisturizing lotion as directed Topical Cooley, Bernon ([id number]) [id number] Page 12 of 14 Keystone antibiotic spray Primary Dressing Maxorb Extra Ag+ Alginate Dressing, 4x4.75 (in/in) Discharge Instruction: Apply to  wound bed as instructed Secondary Dressing ABD Pad, 8x10 Discharge Instruction: Apply over primary dressing as directed. OptiLock Super Absorbent, 5x5.5 (in/in) Discharge Instruction: Apply directly to wound bed as directed Secured With Compression Wrap Urgo K2, (  equivalent to a 4 layer) two layer compression system, regular Discharge Instruction: Apply Urgo K2 as directed (alternative to 4 layer compression). Compression Stockings Add-Ons Electronic Signature(s) Signed: 01/02/2023 12:35:38 PM By: Thayer Dallas Entered By: Thayer Dallas on 12/31/2022 15:36:14 -------------------------------------------------------------------------------- Wound Assessment Details Patient Name: Date of Service: Heckmann, DA [name] 12/31/2022 3:00 PM Medical Record Number: [id number] Patient Account Number: [id number] Date of Birth/Sex: Treating RN: 28-Dec-1945 (77 y.o. M) Primary Care Brighten Orndoff: [name] Other Clinician: Referring Darolyn Double: Treating Jamesyn Moorefield/Extender: Leveda Anna, [name] Weeks in Treatment: 40 Wound Status Wound Number: 7 Primary Lymphedema Etiology: Wound Location: Left, Medial Lower Leg Wound Open Wounding Event: Gradually Appeared Status: Date Acquired: 03/05/2022 Comorbid Anemia, Lymphedema, Deep Vein Thrombosis, Hypertension, Weeks Of Treatment: 40 History: Received Radiation Clustered Wound: Yes Photos Wound Measurements Length: (cm) 4.5 Width: (cm) 5 Depth: (cm) 0.3 Clustered Quantity: 1 Area: (cm) 17.671 Volume: (cm) 5.301 % Reduction in Area: 62.5% % Reduction in Volume: -12.5% Epithelialization: None Tunneling: No Undermining: No Wound Description Classification: Full Thickness Without Exposed Support Structures Bhat, Marvyn ([id number]) Wound Margin: Distinct, outline attached Exudate Amount: Large Exudate Type: Serous Exudate Color: amber Foul Odor After Cleansing: No [id number] Page 13 of  14 Slough/Fibrino Yes Wound Bed Granulation Amount: None Present (0%) Exposed Structure Necrotic Amount: Large (67-100%) Fascia Exposed: No Necrotic Quality: Adherent Slough Fat Layer (Subcutaneous Tissue) Exposed: Yes Tendon Exposed: No Muscle Exposed: No Joint Exposed: No Bone Exposed: No Periwound Skin Texture Texture Color No Abnormalities Noted: No No Abnormalities Noted: No Callus: No Atrophie Blanche: No Crepitus: No Cyanosis: No Excoriation: No Ecchymosis: No Induration: No Erythema: No Rash: No Hemosiderin Staining: Yes Scarring: Yes Mottled: No Pallor: No Moisture Rubor: No No Abnormalities Noted: No Dry / Scaly: No Temperature / Pain Maceration: No Temperature: No Abnormality Treatment Notes Wound #7 (Lower Leg) Wound Laterality: Left, Medial Cleanser Soap and Water Discharge Instruction: May shower and wash wound with dial antibacterial soap and water prior to dressing change. Peri-Wound Care Sween Lotion (Moisturizing lotion) Discharge Instruction: Apply moisturizing lotion as directed Topical Keystone antibiotic spray Primary Dressing Maxorb Extra Ag+ Alginate Dressing, 4x4.75 (in/in) Discharge Instruction: Apply to wound bed as instructed Secondary Dressing ABD Pad, 8x10 Discharge Instruction: Apply over primary dressing as directed. OptiLock Super Absorbent, 5x5.5 (in/in) Discharge Instruction: Apply directly to wound bed as directed Secured With Compression Wrap Urgo K2, (equivalent to a 4 layer) two layer compression system, regular Discharge Instruction: Apply Urgo K2 as directed (alternative to 4 layer compression). Compression Stockings Add-Ons Electronic Signature(s) Signed: 01/02/2023 12:35:38 PM By: Thayer Dallas Entered By: Thayer Dallas on 12/31/2022 15:37:09 Glauser, Dannielle [name] ([id number]) [id number] Page 14 of 14 -------------------------------------------------------------------------------- Vitals  Details Patient Name: Date of Service: Bueso, DA [name] 12/31/2022 3:00 PM Medical Record Number: [id number] Patient Account Number: [id number] Date of Birth/Sex: Treating RN: [birth year] (77 y.o. M) Primary Care Jaycee Mckellips: [name] Other Clinician: Referring Violett Hobbs: Treating Mizael Sagar/Extender: Leveda Anna, [name] Weeks in Treatment: 40 Vital Signs Time Taken: 15:32 Temperature (F): 98.1 Height (in): 74 Pulse (bpm): 81 Weight (lbs): 250 Respiratory Rate (breaths/min): 18 Body Mass Index (BMI): 32.1 Blood Pressure (mmHg): 162/56 Reference Range: 80 - 120 mg / dl Electronic Signature(s) Signed: 01/02/2023 12:35:38 PM By: Thayer Dallas Entered By: Thayer Dallas on 12/31/2022 15:32:20

## 2023-01-07 ENCOUNTER — Encounter (HOSPITAL_BASED_OUTPATIENT_CLINIC_OR_DEPARTMENT_OTHER): Payer: Medicare Other | Admitting: Physician Assistant

## 2023-01-07 DIAGNOSIS — I890 Lymphedema, not elsewhere classified: Secondary | ICD-10-CM | POA: Diagnosis not present

## 2023-01-07 NOTE — Progress Notes (Addendum)
[name], [name] ([id number]) [id number] Page 1 of 16 Visit Report for 01/07/2023 Chief Complaint Document Details Patient Name: Date of Service: [name], [name] 01/07/2023 2:00 PM Medical Record Number: [id number] Patient Account Number: [id number] Date of Birth/Sex: Treating RN: 1945-10-16 (77 y.o. M) Primary Care Provider: [name] Other Clinician: Referring Provider: Treating Provider/Extender: [name] Weeks in Treatment: 63 Information Obtained from: Patient Chief Complaint 02/23/2020; patient is here for wounds on his bilateral lower legs in the setting of severe lymphedema 03/26/2022; patient is here for wounds on his bilateral lower legs medial aspect Electronic Signature(s) Signed: 01/07/2023 2:26:52 PM By: [name] PA-C Entered By: [name] on 01/07/2023 11:26:52 -------------------------------------------------------------------------------- Debridement Details Patient Name: Date of Service: [name], [name] 01/07/2023 2:00 PM Medical Record Number: [id number] Patient Account Number: [id number] Date of Birth/Sex: Treating RN: March 10, 1946 (77 y.o. [name]: [name] Other Clinician: Referring Provider: Treating Provider/Extender: [name] Weeks in Treatment: 40 Debridement Performed for Assessment: Wound #6 Right,Medial Lower Leg Performed By: Physician [name], PA Debridement Type: Debridement Level of Consciousness (Pre-procedure): Awake and Alert Pre-procedure Verification/Time Out Yes - 15:27 Taken: Start Time: 15:27 Pain Control: Lidocaine 4% T opical Solution Percent of Wound Bed Debrided: 50% T Area Debrided (cm): otal 21.2 Tissue and other material debrided: Viable, Non-Viable, Slough, Subcutaneous, Slough Level: Skin/Subcutaneous Tissue Debridement Description: Excisional Instrument: Curette Bleeding: Minimum Hemostasis  Achieved: Pressure End Time: 15:29 Procedural Pain: 0 Post Procedural Pain: 0 Response to Treatment: Procedure was tolerated well Level of Consciousness (Post- Awake and Alert procedure): Post Debridement Measurements of Total Wound Length: (cm) 9 Width: (cm) 6 Depth: (cm) 0.3 Volume: (cm) 12.723 Character of Wound/Ulcer Post Debridement: Improved [name], [name] ([id number]) [id number] Page 2 of 16 Post Procedure Diagnosis Same as Pre-procedure Notes Scribed for [name] PA, by [employer]) Signed: 01/07/2023 5:16:52 PM By: [name] PA-C Signed: 01/07/2023 5:19:26 PM By: [name] RN Entered By: [name] on 01/07/2023 12:32:25 -------------------------------------------------------------------------------- Debridement Details Patient Name: Date of Service: [name], [name] 01/07/2023 2:00 PM Medical Record Number: [id number] Patient Account Number: [id number] Date of Birth/Sex: Treating RN: 04/16/1946 (77 y.o. [name]: [name] Other Clinician: Referring Provider: Treating Provider/Extender: [name] Weeks in Treatment: 45 Debridement Performed for Assessment: Wound #7 Left,Medial Lower Leg Performed By: Physician [name], PA Debridement Type: Debridement Level of Consciousness (Pre-procedure): Awake and Alert Pre-procedure Verification/Time Out Yes - 15:27 Taken: Start Time: 15:27 Pain Control: Lidocaine 4% T opical Solution Percent of Wound Bed Debrided: 100% T Area Debrided (cm): otal 16.2 Tissue and other material debrided: Viable, Non-Viable, Slough, Subcutaneous, Slough Level: Skin/Subcutaneous Tissue Debridement Description: Excisional Instrument: Curette Bleeding: Minimum Hemostasis Achieved: Pressure End Time: 15:29 Procedural Pain: 0 Post Procedural Pain: 0 Response to Treatment: Procedure was tolerated well Level of  Consciousness (Post- Awake and Alert procedure): Post Debridement Measurements of Total Wound Length: (cm) 4.3 Width: (cm) 4.8 Depth: (cm) 0.2 Volume: (cm) 3.242 Character of Wound/Ulcer Post Debridement: Improved Post Procedure Diagnosis Same as Pre-procedure Notes Scribed for [name] PA, by [employer]) Signed: 01/07/2023 5:16:52 PM By: [name] PA-C Signed: 01/07/2023 5:19:26 PM By: [name] RN Entered By: [name] on 01/07/2023 12:33:08 [name], [name] ([id number]) [id number] Page 3 of 16 -------------------------------------------------------------------------------- Debridement Details Patient Name: Date of Service: [name], [name] 01/07/2023 2:00 PM Medical Record Number: [id number] Patient Account Number:  [id number] Date of Birth/Sex: Treating RN: 12/30/1945 (77 y.o. [name]: [name] Other Clinician: Referring Provider: Treating Provider/Extender: [name] in Treatment: 72 Debridement Performed for Assessment: Wound #12 Left,Anterior Lower Leg Performed By: Physician [name], PA Debridement Type: Debridement Level of Consciousness (Pre-procedure): Awake and Alert Pre-procedure Verification/Time Out Yes - 15:27 Taken: Start Time: 15:27 Pain Control: Lidocaine 4% T opical Solution Percent of Wound Bed Debrided: 100% T Area Debrided (cm): otal 7.22 Tissue and other material debrided: Viable, Non-Viable, Slough, Subcutaneous, Slough Level: Skin/Subcutaneous Tissue Debridement Description: Excisional Instrument: Curette Bleeding: Minimum Hemostasis Achieved: Pressure End Time: 15:29 Procedural Pain: 0 Post Procedural Pain: 0 Response to Treatment: Procedure was tolerated well Level of Consciousness (Post- Awake and Alert procedure): Post Debridement Measurements of Total Wound Length: (cm) 2.3 Width: (cm) 4 Depth:  (cm) 0.2 Volume: (cm) 1.445 Character of Wound/Ulcer Post Debridement: Improved Post Procedure Diagnosis Same as Pre-procedure Notes Scribed for [name] PA, by [employer]) Signed: 01/07/2023 5:16:52 PM By: [name] PA-C Signed: 01/07/2023 5:19:26 PM By: [name] RN Entered By: [name] on 01/07/2023 12:33:48 -------------------------------------------------------------------------------- HPI Details Patient Name: Date of Service: [name], [name] 01/07/2023 2:00 PM Medical Record Number: [id number] Patient Account Number: [id number] Date of Birth/Sex: Treating RN: September 11, 1945 (77 y.o. M) Primary Care Provider: [name] Other Clinician: Referring Provider: Treating Provider/Extender: [name] Weeks in Treatment: 3 History of Present Illness [name], [name] ([id number]) [id number] Page 4 of 16 HPI Description: ADMISSION 02/23/2020 Patient is a 77 year old man who lives in [place] who arrives accompanied by his wife. He has a history of chronic lymphedema and venous insufficiency in his bilateral lower legs which may have something to do that with having a history of DVT as well as being treated for prostate cancer. In any case he recently got compression pumps at home but compliance has been an issue here. He has compression stockings however they are probably not sufficient enough to control swelling. They tell us that things deteriorated for him in late August he was admitted to [facility] for 7 days. This was with cellulitis I think of his bilateral lower legs. Discharge he was noted to have wounds on his bilateral lower legs. He was discharged on Bactrim. They tried to get him home health through [facility] of course they declined him. His wife is been wrapping these applying some form of silver foam dressing. He has a history of wounds before although nothing  that would not heal with basic home topical dressings. He has 2 areas on the left medial, left anterior and left lateral and a smaller area on the right medial. All of these have considerable depth. Past medical history includes iron deficiency anemia, lymphedema followed by the rehab center at [facility] with lymphedema wraps I believe, DVT on chronic anticoagulation, prostate cancer, chronic venous insufficiency, hypertension. As mentioned he has compression pumps but does not use them. ABIs in our clinic were noncompressible bilaterally 10/14; patient with severe bilateral lymphedema right greater than left. He came in with bilateral lower extremity wounds left greater than right. Even though the right side has more of the edema most of the wounds here almost closed on the right medial. He has 3 remaining wounds on the left We have been using silver alginate under 4-layer compression I have been trying to get him to be compliant with his external  compression pumps 10/21; patient with 3 small wounds on the left leg and 1 on the right medial in the setting of severe lymphedema and chronic venous insufficiency. We have been using silver alginate under 4-layer compression he is using his external compression pumps twice a day 11/4; ARTERIAL STUDIES on the right show an ABI of 1.02 TBI of 0.858 with biphasic waveforms on the left 0.98 with a TBI of 0.55 and biphasic waveforms. Does not look like he has significant arterial disease. We are treating him for lymphedema he has compression pumps. He has punched-out areas on the left anterior left lateral and right medial lower extremities 11/11; after we obtained his arterial studies I put him in 4 layer compression. He is using his compression pumps probably once a day although I have asked him to do twice. Primary dressing to the wound is silver collagen he has severe lymphedema likely secondary to chronic venous insufficiency. Wounds on the left lateral,  left medial and left anterior and a small area on the right medial 12/2; the area on the right anterior lower leg has healed. We initially thought that the area medially had healed as well however when her discharge nurse came in she detected fluid in the wound simply opened up. This is actually worse than I remember this pain. The area on the left lateral potentially slightly smaller He is also complaining about pain in his left hand he says that this is actually been getting some better he has been using topical creams on this. She asked that I look at this 12/9 after last weeks issues we have 2 wounds one on the right medial lower leg and 1 on the left lateral. Both of these are in the same condition. I think because of thickened skin secondary to chronic lymphedema these wounds actually have depth of almost 0.8 cm. 12/16; the patient has 2 small but deep wounds one on the right medial and one on the left lateral. The right medial is actually the worst of these. He arrives in clinic today with absolutely terrible edema in the right leg apparently his 4-layer wrap fell down to just above his ankle he did not think about this he is apparently been continuing to use his compression pump twice a day. The left leg looks a lot better. 05/09/2020 upon evaluation today patient appears to be doing decently well in regard to his wounds. Everything is measuring smaller the right leg still has a little bit deeper wound in the left seems to be almost completely healed in my opinion I am very pleased in general with how things are progressing. He has a 4- layer compression wrap we have been using endoform today we will probably have to use collagen just based on the fact that we do not have endoform it is on order. 1/6; the patient's wound on the left lateral lower leg has healed. Still has 1 on the right medial. 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 Using compression pumps at home twice a  day. 1/13; left lateral lower leg is still healed. He has a deep punched out rectangular shaped wound on the right medial calf. Looking down at this it appears that he is attempting to epithelialize around the edges of the wound and on the base as well. His edema is reasonably well controlled we have been using collagen with absolutely no effect 1/20; left lateral lower leg remains closed he has extremitease stockings. The area on the right  medial calf I aggressively debrided last week measures larger but the surface looks better. We have been using Hydrofera Blue. We ran Oasis through his insurance but we have not seen the results of this 1/27; left lower leg wound with chronic venous insufficiency and secondary lymphedema. I did aggressive debridement on this last week the wound seems to have come in healthy looking surface using Hydrofera Blue. He was denied for Oasis 2/3; small divot in the right medial lower leg. Under illumination the walls of this divot are epithelialized however the base has slough which I removed with a curette we have been using Hydrofera Blue 2/10 small divot on the right medial lower leg pinpoint illumination at the base of this cone-shaped wound. We have been using Hydrofera Blue but I will switch to calcium alginate this week 2/17; the small divot on the right medial lower leg is fully epithelialized. There is no visible open area under illumination. He has his own stocking for the right leg similar to the one he has been wearing on the left. 03/26/2022; READMISSION This is a now 76 year old man that we had in the clinic from 02/23/2020 through 07/05/2020. At that point he had bilateral lower extremity wounds left greater than right in the setting of severe lymphedema. He had already obtained compression pumps ordered for him I think from the wound care clinic in [facility] so I do not really have record of what he has been using. He claims to be using them  once a day but there is a problem with the sleeve on the left leg. About 2 weeks ago he was hospitalized from 03/11/2022 through 03/14/2022 with diastolic congestive heart failure. 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 He was diuresed. Developed some prerenal azotemia and he has not been taking any diuretics currently. He has not been putting stockings on his legs since he got out of hospital and still has his legs dependent for long periods. Past medical history history of prostate cancer treated with prostatectomy and radiation this was apparently about 8 years ago, history of DVT on chronic Coumadin, history of lymphedema was managed for a while at the clinic in [place]. History of inguinal hernia repair in September 22, hypertension, stage IIIb chronic renal failure ABIs today were noncompressible on the right 1.12 on the left 04-02-2022 upon evaluation today patient appears to be doing well currently in regard to his legs I do feel like both areas that are draining are actually much drier than they were in the picture last week although the left is drier than the right. He is tolerating the 4-layer compression wraps at this point he did contact the pump company and they are actually working on getting him a new compression sleeve for one of his legs which have previously popped and was not functioning properly. 04-23-2022 upon evaluation today patient appears to be doing well currently in regard to his wounds on the legs. I am actually very pleased with where things stand and I do feel like that we are headed in the right direction. Fortunately there is no sign of active infection locally or systemically at this time. [name], [name] ([id number]) [id number] Page 5 of 16 05-07-2022 upon evaluation today patient appears to be doing well currently in regard to his wounds in fact things are showing signs of improvement  which is good news I do not see too much that actually appears to be open and  I am very pleased in that regard. No fevers, chills, nausea, vomiting, or diarrhea. 05-21-2022 upon evaluation today patient appears to be doing somewhat poorly in regard to drainage of his lower extremities bilaterally. The right is greater than left as far as the weeping area. Nonetheless it seems to be getting worse not better. He actually has pitting edema which is at least 2+ to the thighs and I am concerned about the fact that he is may be fluid overloaded in general and that is the reason why we cannot get this under control. I know he is not using his pumps all the time because he actually told the nurse that he was either going to pump or he was going to use his fluid pills but not do both. For that reason I do think that he needs to be really doing both in order to get the fluid out as effectively as possible obviously with the 4-layer compression wraps were doing as much as we can from a compression standpoint but it is really not enough. He tells me that he elevates his leg is much as he can in between pumping and other activity throughout the day. 05-28-2022 upon evaluation today patient appears to be doing better in regard to his wounds although the measurements may be a little bit larger this is a very difficult wound to heal it is very indistinct in a lot of areas. Nonetheless there is can be some need for sharp debridement in regard to both medial and lateral legs. Fortunately I see no signs of active infection locally nor systemically at this time. No fevers, chills, nausea, vomiting, or diarrhea. 06-04-2022 upon evaluation today patient appears to be doing poorly in general in regard to the wounds on his legs. He still continues to have a tremendous amount of fluid not just in the lower portion of his leg but to be honest his thigh where he has 2-3+ pitting edema in the thigh as well. Unfortunately I do  not know that we will be able to get this healed effectively and keep it healed on the lower extremities unless he gets the overall fluid situation taking and under control. Fortunately I do not see any signs of infection locally nor systemically which is great news. He just seems to be very fluid overloaded. 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 He has been tolerating the dressing changes with the compression wraps without complication. Fortunately I do not see any evidence of infection at this time which is great news. No fevers, chills, nausea, vomiting, or diarrhea. 06-18-2022 upon evaluation today patient appears to be doing well currently in regard to his wounds as far as not looking like they are terribly infected but nonetheless I am concerned about a subacute infection secondary to the fact that he continues to have spreading despite the compression therapy. We actually did do an Unna boot on him last week this is actually the first wrap that actually stayed up everything else has been sliding down quite significantly. Fortunately there does not appear to be any signs of infection systemically at this time. With that being said I do believe that locally there seems to be an issue going on here and again I Ernie Hew do a PCR culture to see what that shows also think that I am going to put him on a broad-spectrum antibiotic, doxycycline to see how that will help as well. He  does tell me that coming into the clinic today that he was feeling short of breath like "he was about to have a heart attack" because he was having such a hard time breathing. He says that he told this to Dr. Jodelle Green his cardiologist as well when he was evaluated in the past 1 to 2 weeks. 07-02-2022 upon evaluation today patient appears to be doing poorly currently in regard to his wound. He has been tolerating the  dressing changes. Unfortunately he has not had any compression wraps on for the past week because he was unable to make it in for his appointment last week. With that being said he has a significant amount of drainage he tells me has been using his pumps but despite this in the pumps he still has been draining quite a bit. The drainage is also somewhat purulent unfortunately. We did attempt to get in touch with his cardiologist last week unfortunately we were unable to get up with him I did advise that the patient needs to get in touch with him upon leaving today in order to make sure they know he is on the new antibiotics I am going to send him this will be Levaquin and Augmentin. 07-09-2022 upon evaluation today patient appears to be doing about the same in regard to his legs he may have just a slight amount of improvement with regard to the drainage probably Keystone topical antibiotics are helping in this regard to some degree. Fortunately there does not appear to be any signs of active infection systemically which is great news. No fevers, chills, nausea, vomiting, or diarrhea. 07-16-2022 upon evaluation today patient appears to be doing well currently in regard to his wound. He has been tolerating the dressing changes without complication. Fortunately there does not appear to be any signs of active infection locally nor systemically at this time. With that being said he cannot keep the wraps up he tells me on the left side he had to cut this down because it got too tight. He has been using his pumps but he is on the right side the wrap actually straight down causing some pushing around the central part of his leg just below the calf I think this is a bigger risk for him that help at this point. I think that we may need to try something different he should be getting his compression socks shortly he tells me they were ordered last Thursday. 3/6; ; this is a patient who lives in [place]. He has  severe bilateral lymphedema. He has compression pumps, we have been using kerlix Ace wrap Keystone. He is changing the dressing. We do not have home health. 08-06-2022 upon evaluation today patient appears to be doing a little better in regard to his wounds in general at this point. Fortunately there does not appear to be any signs of active infection locally nor systemically at this time which is great news and overall I am extremely pleased with where we stand today. 08-13-2022 upon evaluation today patient appears to actually be doing significantly better compared to last week. He actually did go to the hospital I told him that he needed to when he left here and he actually did go. With that being said they actually ended up admitting him he was having shortness of breath and I thought it might be related to congestive heart failure turns out he actually had a pulmonary embolism. Subsequently they were able to get him off of the Coumadin switching  over to Eliquis to get things stabilized in that regard they also had them wrapped and got his swelling under control on his legs he actually looks much better pretty much across the board at this point. I am very pleased in that regard. With that being said I am very happy that he finally went that could have been a very dangerous situation. 08-20-2022 upon evaluation today patient appears to be doing well currently in regard to his wound. Has been tolerating the dressing changes without complication. Fortunately there does not appear to be any signs of active infection locally nor systemically at this time. I think his legs are doing better there is some need for sharp debridement today. 08-27-2022 upon evaluation patient is actually making excellent progress. I am actually very pleased with where he stands and I think that he is moving in the right direction. In general I think that we are looking pretty good at the moment. 09-03-2022 upon evaluation today  patient appears to be doing well currently in regard to his wound. He is actually tolerating dressing changes on the left and right leg without complication. Fortunately I do not see any need for debridement of the left leg the right leg I think we probably do some need to perform some debridement here. 09-10-2022 upon evaluation today patient's wounds actually showed signs of improvement in both legs I do not see much is going require debridement today which was great news. Fortunately I do not see any evidence of infection which I think is also excellent he seems to be using his pumps and doing everything right I am happy about how this is progressing at this point. 09-17-2022 upon evaluation today patient appears to be doing decently well in regard to his wounds. He has been tolerating the dressing changes without complication. Fortunately there does not appear to be any signs of active infection at this time which is good news. 09-24-2022 upon evaluation today patient appears to be doing well currently in regard to his wounds. He has been tolerating the dressing changes without complication. Fortunately there does not appear to be any signs of active infection locally nor systemically which is great news. No fevers, chills, nausea, vomiting, or diarrhea. 10-08-2022 upon evaluation today patient appears to be doing excellent currently in regard to his wound. He has been tolerating the dressing changes without complication though it does not sound like he has been using his compression wraps for a bit here. He does think he was doing better with the [facility] topical antibiotics we can definitely go back to using that but I think the biggest issue here is that his swelling is just very out of control and needs to be under control. I discussed that with him today. 10-15-2022 upon evaluation today patient appears to be doing well currently in regard to his wounds. He is actually making some progress which is  good news. Fortunately I do not see any evidence of active infection locally nor systemically which is great news as well. No fevers, chills, nausea, vomiting, or diarrhea. [name], [name] ([id number]) [id number] Page 6 of 16 He does have a callused area on the plantar aspect of his left foot which is actually causing some pain and he wonders if I can trim this down for him. 10-22-2022 upon evaluation today patient seems to be making progress. He is actually doing quite well and very pleased in that regard. I do not see any signs of active infection at this time. 11-05-2022  upon evaluation patient appears to be making progress although slowly towards closure. He seems to be doing well with regard to his legs were still using the [facility] topical antibiotics and he seems to be doing quite well. He is going require some sharp debridement today. 11-19-2022 upon evaluation today patient unfortunately appears to be extremely swollen at this point. He tells me that he ran out of supplies he also tells me his leg started leaking more because of not having supplies he was unable to wear his compression wraps that is the juxta fit compression wraps. Therefore his legs are extremely swollen much larger than normal and do not appear to be doing well at all today. He is going require some debridement I also think he is going require [place] to perform compression wrapping today. 11-26-2022 upon evaluation today patient appears to be doing well currently in regard to his legs as far as infection is concerned I see nothing that appears to be infected. Fortunately I do not see any signs of active infection systemically either which is also good news. With that being said he still is extremely swollen as far as his legs are concerned. I do not see any signs of overall worsening but also do not see any signs of significant improvement which is the major issue here. 12-03-2022 upon evaluation today  patient appears to be doing poorly still in regard to his legs the apparently has been taken off the wraps and not leaving the week by week. With that being said he has not had really the dressings to put on the release that is running out and subsequently though he is using his wraps I am not sure that he has been keeping them on like he needs to. I explained to him I really wanted to have the wraps that I put on him left on until he comes back to see me so that we can keep the compression under better control. He voiced understanding today he tells me that he was "confused about that". Nonetheless based on what I am seeing right now also think that he is up on his feet too much I think he needs to get the feet elevated. 7/24; this is a patient with severe bilateral stage III lymphedema. On the right lower leg medially is a large area of macerated denuded skin was too small deeper areas in this. On the left leg he is 2 areas that are a little more standard in terms of wounds. Massive lymphedema bilaterally. He has compression pumps at home which he uses for 1 hour twice a day 12-17-2022 upon evaluation today patient's legs though a little bit smaller still continue to have significant issues with weeping here this just does not dry. I really think he needs to be changing this daily and this means we will probably have to try to go back to the Velcro wraps and give this a shot. I think may be going to the Velcro wraps, changing the dressing to a superabsorber like XtraSorb or the like, and have him elevate his legs and do his pumps 3 times per day is probably going to be the way to go to try to see if we can make some improvement here. 12-24-2022 upon evaluation today patient unfortunately is still struggling to get anything under control. We have been extremely aggressive and trying to: Control his swelling everything we do however seems to be met with resistance. He tells me that he is pumping 3 times  per day with his lymphedema pumps. He also tells me that he is try to elevate his legs is much as possible and that subsequently he has been keeping his compression wraps on. He goes back and forth between saying that he can keep our wraps on and not but right now I really do not see that he is doing well with the juxta fit is at least not from the standpoint of improving the overall appearance I think it does to some degree help maintain but we are still struggling here. I have voiced a concern to the patient that despite everything you are doing I am still not seeing a lot of improvement here and to be honest I am not sure what is can take get this under control he may have to go into the hospital for admission. 12-31-2022 upon evaluation today patient appears to be doing well currently in regard to his wounds. They seem a little better although he has several pustules around the legs in general but has been concerned about infection. I am actually going to go ahead and see about getting an antibiotic sent into the pharmacy today although I am going to obtain a PCR culture as well. 01-07-2023 upon evaluation today patient appears to be doing well currently in regard to his wound. Has been tolerating the dressing changes without complication. He actually seems to be doing much better. He did have a PCR culture which showed Serratia as well and the prominent organisms here. Levaquin was actually a good option to treat the Serratia along with the other organisms that were problematic including E. coli. Nonetheless I think coupled with the topical Keystone antibiotics and the alginate he is really doing quite well today. Electronic Signature(s) Signed: 01/07/2023 3:38:28 PM By: [name] PA-C Entered By: [name] on 01/07/2023 12:38:27 -------------------------------------------------------------------------------- Physical Exam Details Patient Name: Date of Service: Carrell, [name] 01/07/2023  2:00 PM Medical Record Number: [id number] Patient Account Number: [id number] Date of Birth/Sex: Treating RN: [birth year] (77 y.o. M) Primary Care Provider: [name] Other Clinician: Referring Provider: Treating Provider/Extender: [name] Weeks in Treatment: 82 Constitutional Chronically ill appearing but in no apparent acute distress. Respiratory normal breathing without difficulty. Psychiatric this patient is able to make decisions and demonstrates good insight into disease process. Alert and Oriented x 3. pleasant and cooperative. Notes Upon inspection patient's legs actually appear to be doing significantly better compared to where he was previous. Fortunately I do not see any evidence of active infection locally or systemically which is great news and in general I do believe that we will making headway towards complete closure which is great [name], [name] ([id number]) [id number] Page 7 of 16 news as well. Electronic Signature(s) Signed: 01/07/2023 3:38:44 PM By: [name] PA-C Entered By: [name] on 01/07/2023 12:38:44 -------------------------------------------------------------------------------- Physician Orders Details Patient Name: Date of Service: [place], [name] 01/07/2023 2:00 PM Medical Record Number: [id number] Patient Account Number: [id number] Date of Birth/Sex: Treating RN: [birth year] (77 y.o. [name]: [name] Other Clinician: Referring Provider: Treating Provider/Extender: [name] in Treatment: 22 Verbal / Phone Orders: No Diagnosis Coding ICD-10 Coding Code Description [phone] Chronic venous hypertension (idiopathic) with ulcer and inflammation of bilateral lower extremity I89.0 Lymphedema, not elsewhere classified 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.818  Non-pressure chronic ulcer of other part of right lower leg with  other specified severity L84 Corns and callosities Follow-up Appointments ppointment in 1 week. [name] PA Wednesday ****extra time 60 minutes**** Return A 01/14/23 at 3pm ppointment in 2 weeks. [name] PA 01/21/23 at 3pm Return A Return appointment in 3 weeks. - Please see front desk to schedule appointment Return appointment in 1 month. - Please see front desk to schedule appointment Other: - Please ask your Primary Care Practioner or Foot Doctor to order orthotic shoes. Anesthetic (In clinic) Topical Lidocaine 5% applied to wound bed Bathing/ Shower/ Hygiene May shower with protection but do not get wound dressing(s) wet. Protect dressing(s) with water repellant cover (for example, large plastic bag) or a cast cover and may then take shower. Edema Control - Lymphedema / SCD / Other Lymphedema Pumps. Use Lymphedema pumps on leg(s) 2-3 times a day for 45-60 minutes. If wearing any wraps or hose, do not remove them. Continue exercising as instructed. - *****pump 3 times a day for an hour each time.***** Elevate legs to the level of the heart or above for 30 minutes daily and/or when sitting for 3-4 times a day throughout the day. - +++ Important+++ ELEVATE THE LEGS>>>>>>>>>> Avoid standing for long periods of time. Exercise regularly Compression stocking or Garment 30-40 mm/Hg pressure to: - use juxtafits to both legs daily if compression wraps removed Other Edema Control Orders/Instructions: - When sitting please keep your feet up, keep legs at Heart Level or above. Use pillows behind calf to for comfort. Off-Loading Other: - When sitting please keep your feet up, keep legs at Heart Level or above. Use pillows behind calf to for comfort. Additional Orders / Instructions Follow Nutritious Diet - Try and increase protein intake. Goal of protein intake is 60g-100g. Wound Treatment Wound #11 - Lower Leg Wound  Laterality: Left, Posterior Cleanser: Soap and Water 1 x Per Week/30 Days Discharge Instructions: May shower and wash wound with dial antibacterial soap and water prior to dressing change. Peri-Wound Care: Sween Lotion (Moisturizing lotion) 1 x Per Week/30 Days Discharge Instructions: Apply moisturizing lotion as directed Topical: Keystone antibiotic spray 1 x Per Week/30 Days Prim Dressing: Maxorb Extra Ag+ Alginate Dressing, 4x4.75 (in/in) ary 1 x Per Week/30 Days [name], [name] ([id number]) [id number] Page 8 of 16 Discharge Instructions: Apply to wound bed as instructed Secondary Dressing: ABD Pad, 8x10 1 x Per Week/30 Days Discharge Instructions: Apply over primary dressing as directed. Secondary Dressing: OptiLock Super Absorbent, 5x5.5 (in/in) (Generic) 1 x Per Week/30 Days Discharge Instructions: Apply directly to wound bed as directed Compression Wrap: Urgo K2, (equivalent to a 4 layer) two layer compression system, regular 1 x Per Week/30 Days Discharge Instructions: Apply Urgo K2 as directed (alternative to 4 layer compression). Wound #12 - Lower Leg Wound Laterality: Left, Anterior Cleanser: Soap and Water 1 x Per Week/30 Days Discharge Instructions: May shower and wash wound with dial antibacterial soap and water prior to dressing change. Peri-Wound Care: Sween Lotion (Moisturizing lotion) 1 x Per Week/30 Days Discharge Instructions: Apply moisturizing lotion as directed Topical: Keystone antibiotic spray 1 x Per Week/30 Days Prim Dressing: Maxorb Extra Ag+ Alginate Dressing, 4x4.75 (in/in) 1 x Per Week/30 Days ary Discharge Instructions: Apply to wound bed as instructed Secondary Dressing: ABD Pad, 8x10 1 x Per Week/30 Days Discharge Instructions: Apply over primary dressing as directed. Secondary Dressing: OptiLock Super Absorbent, 5x5.5 (in/in) (Generic) 1 x Per Week/30 Days Discharge Instructions: Apply directly to wound bed as  directed Compression Wrap: Urgo K2, (equivalent to a  4 layer) two layer compression system, regular 1 x Per Week/30 Days Discharge Instructions: Apply Urgo K2 as directed (alternative to 4 layer compression). Wound #13 - Lower Leg Wound Laterality: Left, Lateral Cleanser: Soap and Water 1 x Per Week/30 Days Discharge Instructions: May shower and wash wound with dial antibacterial soap and water prior to dressing change. Peri-Wound Care: Sween Lotion (Moisturizing lotion) 1 x Per Week/30 Days Discharge Instructions: Apply moisturizing lotion as directed Topical: Keystone antibiotic spray 1 x Per Week/30 Days Prim Dressing: Maxorb Extra Ag+ Alginate Dressing, 4x4.75 (in/in) 1 x Per Week/30 Days ary Discharge Instructions: Apply to wound bed as instructed Secondary Dressing: ABD Pad, 8x10 1 x Per Week/30 Days Discharge Instructions: Apply over primary dressing as directed. Secondary Dressing: OptiLock Super Absorbent, 5x5.5 (in/in) (Generic) 1 x Per Week/30 Days Discharge Instructions: Apply directly to wound bed as directed Compression Wrap: Urgo K2, (equivalent to a 4 layer) two layer compression system, regular 1 x Per Week/30 Days Discharge Instructions: Apply Urgo K2 as directed (alternative to 4 layer compression). Wound #6 - Lower Leg Wound Laterality: Right, Medial Cleanser: Soap and Water 1 x Per Week/30 Days Discharge Instructions: May shower and wash wound with dial antibacterial soap and water prior to dressing change. Peri-Wound Care: Sween Lotion (Moisturizing lotion) 1 x Per Week/30 Days Discharge Instructions: Apply moisturizing lotion as directed Topical: Keystone antibiotic spray 1 x Per Week/30 Days Prim Dressing: Maxorb Extra Ag+ Alginate Dressing, 4x4.75 (in/in) 1 x Per Week/30 Days ary Discharge Instructions: Apply to wound bed as instructed Secondary Dressing: ABD Pad, 8x10 1 x Per Week/30 Days Discharge Instructions: Apply over primary dressing as directed. Secondary  Dressing: OptiLock Super Absorbent, 5x5.5 (in/in) (Generic) 1 x Per Week/30 Days Discharge Instructions: Apply directly to wound bed as directed Compression Wrap: Urgo K2, (equivalent to a 4 layer) two layer compression system, regular 1 x Per Week/30 Days Discharge Instructions: Apply Urgo K2 as directed (alternative to 4 layer compression). Wound #7 - Lower Leg Wound Laterality: Left, Medial [name], [name] ([id number]) [id number] Page 9 of 16 Cleanser: Soap and Water 1 x Per Week/30 Days Discharge Instructions: May shower and wash wound with dial antibacterial soap and water prior to dressing change. Peri-Wound Care: Sween Lotion (Moisturizing lotion) 1 x Per Week/30 Days Discharge Instructions: Apply moisturizing lotion as directed Topical: Keystone antibiotic spray 1 x Per Week/30 Days Prim Dressing: Maxorb Extra Ag+ Alginate Dressing, 4x4.75 (in/in) 1 x Per Week/30 Days ary Discharge Instructions: Apply to wound bed as instructed Secondary Dressing: ABD Pad, 8x10 1 x Per Week/30 Days Discharge Instructions: Apply over primary dressing as directed. Secondary Dressing: OptiLock Super Absorbent, 5x5.5 (in/in) (Generic) 1 x Per Week/30 Days Discharge Instructions: Apply directly to wound bed as directed Compression Wrap: Urgo K2, (equivalent to a 4 layer) two layer compression system, regular 1 x Per Week/30 Days Discharge Instructions: Apply Urgo K2 as directed (alternative to 4 layer compression). Electronic Signature(s) Signed: 01/07/2023 5:16:52 PM By: [name] PA-C Signed: 01/07/2023 5:19:26 PM By: [name] RN Entered By: [name] on 01/07/2023 12:46:25 -------------------------------------------------------------------------------- Problem List Details Patient Name: Date of Service: Despain, [name] 01/07/2023 2:00 PM Medical Record Number: [id number] Patient Account Number: [id number] Date of Birth/Sex: Treating RN: [birth year] (77 y.o.  M) Primary Care Provider: [name] Other Clinician: Referring Provider: Treating Provider/Extender: [name] in Treatment: 22 Active Problems ICD-10 Encounter Code Description Active Date MDM Diagnosis I87.333 Chronic venous hypertension (idiopathic) with ulcer and inflammation  of 06/11/2022 No Yes bilateral lower extremity I89.0 Lymphedema, not elsewhere classified 03/26/2022 No Yes L97.828 Non-pressure chronic ulcer of other part of left lower leg with other specified 03/26/2022 No Yes severity 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 other part of right lower leg with other specified 03/26/2022 No Yes severity L84 Corns and callosities 10/15/2022 No Yes Inactive Problems Resolved Problems Guinta, Azhar ([id number]) [id number] Page 10 of 16 Electronic Signature(s) Signed: 01/07/2023 2:25:56 PM By: [name] PA-C Entered By: [name] on 01/07/2023 11:25:56 -------------------------------------------------------------------------------- Progress Note Details Patient Name: Date of Service: Souffrant, [name] 01/07/2023 2:00 PM Medical Record Number: [id number] Patient Account Number: [id number] Date of Birth/Sex: Treating RN: 18-Jan-1946 (77 y.o. M) Primary Care Provider: [name] Other Clinician: Referring Provider: Treating Provider/Extender: [name] Weeks in Treatment: 53 Subjective Chief Complaint Information obtained from Patient 02/23/2020; patient is here for wounds on his bilateral lower legs in the setting of severe lymphedema 03/26/2022; patient is here for wounds on his bilateral lower legs medial aspect History of Present Illness (HPI) ADMISSION 02/23/2020 Patient is a 77 year old man who lives in [place] who arrives accompanied by his wife. He has a history of chronic lymphedema and venous insufficiency in his bilateral lower legs which may have something to do that with  having a history of DVT as well as being treated for prostate cancer. In any case he recently got compression pumps at home but compliance has been an issue here. He has compression stockings however they are probably not sufficient enough to control swelling. They tell us that things deteriorated for him in late August he was admitted to [facility] for 7 days. This was with cellulitis I think of his bilateral lower legs. Discharge he was noted to have wounds on his bilateral lower legs. He was discharged on Bactrim. They tried to get him home health through [facility] of course they declined him. His wife is been wrapping these applying some form of silver foam dressing. He has a history of wounds before although nothing that would not heal with basic home topical dressings. He has 2 areas on the left medial, left anterior and left lateral and a smaller area on the right medial. All of these have considerable depth. Past medical history includes iron deficiency anemia, lymphedema followed by the rehab center at [facility] with lymphedema wraps I believe, DVT on chronic anticoagulation, prostate cancer, chronic venous insufficiency, hypertension. As mentioned he has compression pumps but does not use them. ABIs in our clinic were noncompressible bilaterally 10/14; patient with severe bilateral lymphedema right greater than left. He came in with bilateral lower extremity wounds left greater than right. Even though the right side has more of the edema most of the wounds here almost closed on the right medial. He has 3 remaining wounds on the left We have been using silver alginate under 4-layer compression I have been trying to get him to be compliant with his external compression pumps 10/21; patient with 3 small wounds on the left leg and 1 on the right medial in the setting of severe lymphedema and chronic venous insufficiency. We have been using silver alginate under 4-layer  compression he is using his external compression pumps twice a day 11/4; ARTERIAL STUDIES on the right show an ABI of 1.02 TBI of 0.858 with biphasic waveforms on the left 0.98 with a TBI of 0.55 and biphasic waveforms. Does not look like he has significant arterial  disease. We are treating him for lymphedema he has compression pumps. He has punched-out areas on the left anterior left lateral and right medial lower extremities 11/11; after we obtained his arterial studies I put him in 4 layer compression. He is using his compression pumps probably once a day although I have asked him to do twice. Primary dressing to the wound is silver collagen he has severe lymphedema likely secondary to chronic venous insufficiency. Wounds on the left lateral, left medial and left anterior and a small area on the right medial 12/2; the area on the right anterior lower leg has healed. We initially thought that the area medially had healed as well however when her discharge nurse came in she detected fluid in the wound simply opened up. This is actually worse than I remember this pain. The area on the left lateral potentially slightly smaller He is also complaining about pain in his left hand he says that this is actually been getting some better he has been using topical creams on this. She asked that I look at this 12/9 after last weeks issues we have 2 wounds one on the right medial lower leg and 1 on the left lateral. Both of these are in the same condition. I think because of thickened skin secondary to chronic lymphedema these wounds actually have depth of almost 0.8 cm. 12/16; the patient has 2 small but deep wounds one on the right medial and one on the left lateral. The right medial is actually the worst of these. He arrives in clinic today with absolutely terrible edema in the right leg apparently his 4-layer wrap fell down to just above his ankle he did not think about this he is apparently been continuing  to use his compression pump twice a day. The left leg looks a lot better. 05/09/2020 upon evaluation today patient appears to be doing decently well in regard to his wounds. Everything is measuring smaller the right leg still has a little bit deeper wound in the left seems to be almost completely healed in my opinion I am very pleased in general with how things are progressing. He has a 4- layer compression wrap we have been using endoform today we will probably have to use collagen just based on the fact that we do not have endoform it is on order. 1/6; the patient's wound on the left lateral lower leg has healed. Still has 1 on the right medial. 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 Using compression pumps at home twice a day. 1/13; left lateral lower leg is still healed. He has a deep punched out rectangular shaped wound on the right medial calf. Looking down at this it appears that he is attempting to epithelialize around the edges of the wound and on the base as well. His edema is reasonably well controlled we have been using collagen with absolutely no effect 1/20; left lateral lower leg remains closed he has extremitease stockings. The area on the right medial calf I aggressively debrided last week measures larger but the surface looks better. We have been using Hydrofera Blue. We ran Oasis through his insurance but we have not seen the results of this [name], [facility] ([id number]) [id number] Page 11 of 16 1/27; left lower leg wound with chronic venous insufficiency and secondary lymphedema. I did aggressive debridement on this last week the wound seems to have come in healthy looking surface using Hydrofera Blue. He was denied for Oasis 2/3; small divot in  the right medial lower leg. Under illumination the walls of this divot are epithelialized however the base has slough which I removed with a curette we have been using Hydrofera Blue 2/10  small divot on the right medial lower leg pinpoint illumination at the base of this cone-shaped wound. We have been using Hydrofera Blue but I will switch to calcium alginate this week 2/17; the small divot on the right medial lower leg is fully epithelialized. There is no visible open area under illumination. He has his own stocking for the right leg similar to the one he has been wearing on the left. 03/26/2022; READMISSION This is a now 77 year old man that we had in the clinic from 02/23/2020 through 07/05/2020. At that point he had bilateral lower extremity wounds left greater than right in the setting of severe lymphedema. He had already obtained compression pumps ordered for him I think from the wound care clinic in [facility] so I do not really have record of what he has been using. He claims to be using them once a day but there is a problem with the sleeve on the left leg. About 2 weeks ago he was hospitalized from 03/11/2022 through 03/14/2022 with diastolic congestive heart failure. 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 He was diuresed. Developed some prerenal azotemia and he has not been taking any diuretics currently. He has not been putting stockings on his legs since he got out of hospital and still has his legs dependent for long periods. Past medical history history of prostate cancer treated with prostatectomy and radiation this was apparently about 8 years ago, history of DVT on chronic Coumadin, history of lymphedema was managed for a while at the clinic in [place]. History of inguinal hernia repair in September 22, hypertension, stage IIIb chronic renal failure ABIs today were noncompressible on the right 1.12 on the left 04-02-2022 upon evaluation today patient appears to be doing well currently in regard to his legs I do feel like both areas that are draining are actually much drier than they were in the picture last  week although the left is drier than the right. He is tolerating the 4-layer compression wraps at this point he did contact the pump company and they are actually working on getting him a new compression sleeve for one of his legs which have previously popped and was not functioning properly. 04-23-2022 upon evaluation today patient appears to be doing well currently in regard to his wounds on the legs. I am actually very pleased with where things stand and I do feel like that we are headed in the right direction. Fortunately there is no sign of active infection locally or systemically at this time. 05-07-2022 upon evaluation today patient appears to be doing well currently in regard to his wounds in fact things are showing signs of improvement which is good news I do not see too much that actually appears to be open and I am very pleased in that regard. No fevers, chills, nausea, vomiting, or diarrhea. 05-21-2022 upon evaluation today patient appears to be doing somewhat poorly in regard to drainage of his lower extremities bilaterally. The right is greater than left as far as the weeping area. Nonetheless it seems to be getting worse not better. He actually has pitting edema which is at least 2+ to the thighs and I am concerned about the fact that he is may be fluid overloaded in general and  that is the reason why we cannot get this under control. I know he is not using his pumps all the time because he actually told the nurse that he was either going to pump or he was going to use his fluid pills but not do both. For that reason I do think that he needs to be really doing both in order to get the fluid out as effectively as possible obviously with the 4-layer compression wraps were doing as much as we can from a compression standpoint but it is really not enough. He tells me that he elevates his leg is much as he can in between pumping and other activity throughout the day. 05-28-2022 upon evaluation  today patient appears to be doing better in regard to his wounds although the measurements may be a little bit larger this is a very difficult wound to heal it is very indistinct in a lot of areas. Nonetheless there is can be some need for sharp debridement in regard to both medial and lateral legs. Fortunately I see no signs of active infection locally nor systemically at this time. No fevers, chills, nausea, vomiting, or diarrhea. 06-04-2022 upon evaluation today patient appears to be doing poorly in general in regard to the wounds on his legs. He still continues to have a tremendous amount of fluid not just in the lower portion of his leg but to be honest his thigh where he has 2-3+ pitting edema in the thigh as well. Unfortunately I do not know that we will be able to get this healed effectively and keep it healed on the lower extremities unless he gets the overall fluid situation taking and under control. Fortunately I do not see any signs of infection locally nor systemically which is great news. He just seems to be very fluid overloaded. 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 He has been tolerating the dressing changes with the compression wraps without complication. Fortunately I do not see any evidence of infection at this time which is great news. No fevers, chills, nausea, vomiting, or diarrhea. 06-18-2022 upon evaluation today patient appears to be doing well currently in regard to his wounds as far as not looking like they are terribly infected but nonetheless I am concerned about a subacute infection secondary to the fact that he continues to have spreading despite the compression therapy. We actually did do an Unna boot on him last week this is actually the first wrap that actually stayed up everything else has been sliding down quite significantly. Fortunately there does  not appear to be any signs of infection systemically at this time. With that being said I do believe that locally there seems to be an issue going on here and again I Ernie Hew do a PCR culture to see what that shows also think that I am going to put him on a broad-spectrum antibiotic, doxycycline to see how that will help as well. He does tell me that coming into the clinic today that he was feeling short of breath like "he was about to have a heart attack" because he was having such a hard time breathing. He says that he told this to Dr. Jodelle Green his cardiologist as well when he was evaluated in the past 1 to 2 weeks. 07-02-2022 upon evaluation today patient appears to be doing poorly currently in regard to his wound. He has been tolerating the dressing changes. Unfortunately he has  not had any compression wraps on for the past week because he was unable to make it in for his appointment last week. With that being said he has a significant amount of drainage he tells me has been using his pumps but despite this in the pumps he still has been draining quite a bit. The drainage is also somewhat purulent unfortunately. We did attempt to get in touch with his cardiologist last week unfortunately we were unable to get up with him I did advise that the patient needs to get in touch with him upon leaving today in order to make sure they know he is on the new antibiotics I am going to send him this will be Levaquin and Augmentin. 07-09-2022 upon evaluation today patient appears to be doing about the same in regard to his legs he may have just a slight amount of improvement with regard to the drainage probably Keystone topical antibiotics are helping in this regard to some degree. Fortunately there does not appear to be any signs of active infection systemically which is great news. No fevers, chills, nausea, vomiting, or diarrhea. 07-16-2022 upon evaluation today patient appears to be doing well currently in regard  to his wound. He has been tolerating the dressing changes without complication. Fortunately there does not appear to be any signs of active infection locally nor systemically at this time. With that being said he cannot keep the wraps up he tells me on the left side he had to cut this down because it got too tight. He has been using his pumps but he is on the right side the wrap actually straight down causing some pushing around the central part of his leg just below the calf I think this is a bigger risk for him that help at this point. I think that we may need to try something different he should be getting his compression socks shortly he tells me they were ordered last Thursday. 3/6; ; this is a patient who lives in [place]. He has severe bilateral lymphedema. He has compression pumps, we have been using kerlix Ace wrap Keystone. He is changing the dressing. We do not have home health. 08-06-2022 upon evaluation today patient appears to be doing a little better in regard to his wounds in general at this point. Fortunately there does not appear to be any signs of active infection locally nor systemically at this time which is great news and overall I am extremely pleased with where we stand today. Pine, [name] ([id number]) [id number] Page 12 of 16 08-13-2022 upon evaluation today patient appears to actually be doing significantly better compared to last week. He actually did go to the hospital I told him that he needed to when he left here and he actually did go. With that being said they actually ended up admitting him he was having shortness of breath and I thought it might be related to congestive heart failure turns out he actually had a pulmonary embolism. Subsequently they were able to get him off of the Coumadin switching over to Eliquis to get things stabilized in that regard they also had them wrapped and got his swelling under control on his legs he  actually looks much better pretty much across the board at this point. I am very pleased in that regard. With that being said I am very happy that he finally went that could have been a very dangerous situation. 08-20-2022 upon evaluation today patient appears to be doing well currently in  regard to his wound. Has been tolerating the dressing changes without complication. Fortunately there does not appear to be any signs of active infection locally nor systemically at this time. I think his legs are doing better there is some need for sharp debridement today. 08-27-2022 upon evaluation patient is actually making excellent progress. I am actually very pleased with where he stands and I think that he is moving in the right direction. In general I think that we are looking pretty good at the moment. 09-03-2022 upon evaluation today patient appears to be doing well currently in regard to his wound. He is actually tolerating dressing changes on the left and right leg without complication. Fortunately I do not see any need for debridement of the left leg the right leg I think we probably do some need to perform some debridement here. 09-10-2022 upon evaluation today patient's wounds actually showed signs of improvement in both legs I do not see much is going require debridement today which was great news. Fortunately I do not see any evidence of infection which I think is also excellent he seems to be using his pumps and doing everything right I am happy about how this is progressing at this point. 09-17-2022 upon evaluation today patient appears to be doing decently well in regard to his wounds. He has been tolerating the dressing changes without complication. Fortunately there does not appear to be any signs of active infection at this time which is good news. 09-24-2022 upon evaluation today patient appears to be doing well currently in regard to his wounds. He has been tolerating the dressing changes  without complication. Fortunately there does not appear to be any signs of active infection locally nor systemically which is great news. No fevers, chills, nausea, vomiting, or diarrhea. 10-08-2022 upon evaluation today patient appears to be doing excellent currently in regard to his wound. He has been tolerating the dressing changes without complication though it does not sound like he has been using his compression wraps for a bit here. He does think he was doing better with the [facility] topical antibiotics we can definitely go back to using that but I think the biggest issue here is that his swelling is just very out of control and needs to be under control. I discussed that with him today. 10-15-2022 upon evaluation today patient appears to be doing well currently in regard to his wounds. He is actually making some progress which is good news. Fortunately I do not see any evidence of active infection locally nor systemically which is great news as well. No fevers, chills, nausea, vomiting, or diarrhea. He does have a callused area on the plantar aspect of his left foot which is actually causing some pain and he wonders if I can trim this down for him. 10-22-2022 upon evaluation today patient seems to be making progress. He is actually doing quite well and very pleased in that regard. I do not see any signs of active infection at this time. 11-05-2022 upon evaluation patient appears to be making progress although slowly towards closure. He seems to be doing well with regard to his legs were still using the [facility] topical antibiotics and he seems to be doing quite well. He is going require some sharp debridement today. 11-19-2022 upon evaluation today patient unfortunately appears to be extremely swollen at this point. He tells me that he ran out of supplies he also tells me his leg started leaking more because of not having supplies he was  unable to wear his compression wraps that is the juxta fit  compression wraps. Therefore his legs are extremely swollen much larger than normal and do not appear to be doing well at all today. He is going require some debridement I also think he is going require [place] to perform compression wrapping today. 11-26-2022 upon evaluation today patient appears to be doing well currently in regard to his legs as far as infection is concerned I see nothing that appears to be infected. Fortunately I do not see any signs of active infection systemically either which is also good news. With that being said he still is extremely swollen as far as his legs are concerned. I do not see any signs of overall worsening but also do not see any signs of significant improvement which is the major issue here. 12-03-2022 upon evaluation today patient appears to be doing poorly still in regard to his legs the apparently has been taken off the wraps and not leaving the week by week. With that being said he has not had really the dressings to put on the release that is running out and subsequently though he is using his wraps I am not sure that he has been keeping them on like he needs to. I explained to him I really wanted to have the wraps that I put on him left on until he comes back to see me so that we can keep the compression under better control. He voiced understanding today he tells me that he was "confused about that". Nonetheless based on what I am seeing right now also think that he is up on his feet too much I think he needs to get the feet elevated. 7/24; this is a patient with severe bilateral stage III lymphedema. On the right lower leg medially is a large area of macerated denuded skin was too small deeper areas in this. On the left leg he is 2 areas that are a little more standard in terms of wounds. Massive lymphedema bilaterally. He has compression pumps at home which he uses for 1 hour twice a day 12-17-2022 upon evaluation today patient's legs though a little bit  smaller still continue to have significant issues with weeping here this just does not dry. I really think he needs to be changing this daily and this means we will probably have to try to go back to the Velcro wraps and give this a shot. I think may be going to the Velcro wraps, changing the dressing to a superabsorber like XtraSorb or the like, and have him elevate his legs and do his pumps 3 times per day is probably going to be the way to go to try to see if we can make some improvement here. 12-24-2022 upon evaluation today patient unfortunately is still struggling to get anything under control. We have been extremely aggressive and trying to: Control his swelling everything we do however seems to be met with resistance. He tells me that he is pumping 3 times per day with his lymphedema pumps. He also tells me that he is try to elevate his legs is much as possible and that subsequently he has been keeping his compression wraps on. He goes back and forth between saying that he can keep our wraps on and not but right now I really do not see that he is doing well with the juxta fit is at least not from the standpoint of improving the overall appearance I think it does to some  degree help maintain but we are still struggling here. I have voiced a concern to the patient that despite everything you are doing I am still not seeing a lot of improvement here and to be honest I am not sure what is can take get this under control he may have to go into the hospital for admission. 12-31-2022 upon evaluation today patient appears to be doing well currently in regard to his wounds. They seem a little better although he has several pustules around the legs in general but has been concerned about infection. I am actually going to go ahead and see about getting an antibiotic sent into the pharmacy today although I am going to obtain a PCR culture as well. 01-07-2023 upon evaluation today patient appears to be doing  well currently in regard to his wound. Has been tolerating the dressing changes without complication. He actually seems to be doing much better. He did have a PCR culture which showed Serratia as well and the prominent organisms here. Levaquin was actually a good option to treat the Serratia along with the other organisms that were problematic including E. coli. Nonetheless I think coupled with the topical Keystone antibiotics and the alginate he is really doing quite well today. Linz, [name] ([id number]) [id number] Page 13 of 16 Objective Constitutional Chronically ill appearing but in no apparent acute distress. Vitals Time Taken: 3:04 PM, Height: 74 in, Weight: 250 lbs, BMI: 32.1, Temperature: 98 F, Pulse: 83 bpm, Respiratory Rate: 18 breaths/min, Blood Pressure: 194/80 mmHg. Respiratory normal breathing without difficulty. Psychiatric this patient is able to make decisions and demonstrates good insight into disease process. Alert and Oriented x 3. pleasant and cooperative. General Notes: Upon inspection patient's legs actually appear to be doing significantly better compared to where he was previous. Fortunately I do not see any evidence of active infection locally or systemically which is great news and in general I do believe that we will making headway towards complete closure which is great news as well. Integumentary (Hair, Skin) Wound #11 status is Open. Original cause of wound was Gradually Appeared. The date acquired was: 10/19/2022. The wound has been in treatment 11 weeks. The wound is located on the Left,Posterior Lower Leg. The wound measures 0.5cm length x 0.5cm width x 0.1cm depth; 0.196cm^2 area and 0.02cm^3 volume. There is no tunneling or undermining noted. There is a medium amount of serous drainage noted. The wound margin is distinct with the outline attached to the wound base. There is small (1-33%) red granulation within the wound bed. There  is a large (67-100%) amount of necrotic tissue within the wound bed including Adherent Slough. The periwound skin appearance had no abnormalities noted for texture. The periwound skin appearance had no abnormalities noted for color. The periwound skin appearance exhibited: Dry/Scaly. The periwound skin appearance did not exhibit: Maceration. Periwound temperature was noted as No Abnormality. Wound #12 status is Open. Original cause of wound was Frostbite. The date acquired was: 12/17/2022. The wound has been in treatment 3 weeks. The wound is located on the Left,Anterior Lower Leg. The wound measures 2.3cm length x 4cm width x 0.2cm depth; 7.226cm^2 area and 1.445cm^3 volume. There is Fat Layer (Subcutaneous Tissue) exposed. There is no tunneling or undermining noted. There is a large amount of serous drainage noted. The wound margin is distinct with the outline attached to the wound base. There is small (1-33%) red granulation within the wound bed. There is a large (67-100%) amount of necrotic tissue within the  wound bed including Adherent Slough. The periwound skin appearance exhibited: Hemosiderin Staining. The periwound skin appearance did not exhibit: Callus, Crepitus, Excoriation, Induration, Rash, Scarring, Dry/Scaly, Maceration, Atrophie Blanche, Cyanosis, Ecchymosis, Mottled, Pallor, Rubor, Erythema. Periwound temperature was noted as No Abnormality. Wound #13 status is Open. Original cause of wound was Gradually Appeared. The date acquired was: 12/24/2022. The wound has been in treatment 2 weeks. The wound is located on the Left,Lateral Lower Leg. The wound measures 0.5cm length x 0.5cm width x 0.1cm depth; 0.196cm^2 area and 0.02cm^3 volume. There is Fat Layer (Subcutaneous Tissue) exposed. There is no tunneling noted. There is a medium amount of serous drainage noted. The wound margin is flat and intact. There is small (1-33%) pink granulation within the wound bed. There is a large (67-100%)  amount of necrotic tissue within the wound bed including Eschar and Adherent Slough. The periwound skin appearance had no abnormalities noted for texture. The periwound skin appearance had no abnormalities noted for moisture. The periwound skin appearance exhibited: Hemosiderin Staining. Periwound temperature was noted as No Abnormality. Wound #6 status is Open. Original cause of wound was Gradually Appeared. The date acquired was: 03/05/2022. The wound has been in treatment 41 weeks. The wound is located on the Right,Medial Lower Leg. The wound measures 9cm length x 6cm width x 0.3cm depth; 42.412cm^2 area and 12.723cm^3 volume. There is Fat Layer (Subcutaneous Tissue) exposed. There is no tunneling or undermining noted. There is a large amount of serous drainage noted. The wound margin is distinct with the outline attached to the wound base. There is small (1-33%) pink granulation within the wound bed. There is a large (67-100%) amount of necrotic tissue within the wound bed including Eschar and Adherent Slough. The periwound skin appearance exhibited: Scarring, Hemosiderin Staining. The periwound skin appearance did not exhibit: Callus, Crepitus, Excoriation, Induration, Rash, Dry/Scaly, Maceration, Atrophie Blanche, Cyanosis, Ecchymosis, Mottled, Pallor, Rubor, Erythema. Periwound temperature was noted as No Abnormality. Wound #7 status is Open. Original cause of wound was Gradually Appeared. The date acquired was: 03/05/2022. The wound has been in treatment 41 weeks. The wound is located on the Left,Medial Lower Leg. The wound measures 4.3cm length x 4.8cm width x 0.2cm depth; 16.211cm^2 area and 3.242cm^3 volume. There is Fat Layer (Subcutaneous Tissue) exposed. There is no tunneling or undermining noted. There is a large amount of serous drainage noted. The wound margin is distinct with the outline attached to the wound base. There is small (1-33%) pink granulation within the wound bed. There is  a large (67-100%) amount of necrotic tissue within the wound bed including Adherent Slough. The periwound skin appearance exhibited: Scarring, Hemosiderin Staining. The periwound skin appearance did not exhibit: Callus, Crepitus, Excoriation, Induration, Rash, Dry/Scaly, Maceration, Atrophie Blanche, Cyanosis, Ecchymosis, Mottled, Pallor, Rubor, Erythema. Periwound temperature was noted as No Abnormality. 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 There was a Excisional Skin/Subcutaneous Tissue Debridement with a total area of 7.22 sq cm performed by [name], PA. With the following instrument(s): Curette to remove Viable and Non-Viable tissue/material. Material removed includes Subcutaneous Tissue and Slough and after achieving pain control using Lidocaine 4%  T opical Solution. No specimens were taken. A time out was conducted at 15:27, prior to the start of the procedure. A Minimum amount of bleeding was controlled with Pressure. The procedure [name], [name] ([id number]) [id number] Page 14 of 16 was tolerated well with a pain level of 0 throughout and a pain level of 0 following the procedure. Post Debridement Measurements: 2.3cm length x 4cm width x 0.2cm depth; 1.445cm^3 volume. Character of Wound/Ulcer Post Debridement is improved. Post procedure Diagnosis Wound #12: Same as Pre-Procedure General Notes: Scribed for [name] PA, by J.Scotton. Pre-procedure diagnosis of Wound #12 is a Lymphedema located on the  Left,Anterior Lower Leg . There was a Four Layer Compression Therapy Procedure by [name], RN. Post procedure Diagnosis Wound #12: Same as Pre-Procedure Wound #6 Pre-procedure diagnosis of Wound #6 is a Lymphedema located on the Right,Medial Lower Leg . There was a Excisional Skin/Subcutaneous Tissue Debridement with a total area of 21.2 sq cm performed by [name], PA. With the following instrument(s): Curette to remove Viable and Non-Viable tissue/material. Material removed includes Subcutaneous Tissue and Slough and after achieving pain control using Lidocaine 4% T opical Solution. No specimens were taken. A time out was conducted at 15:27, prior to the start of the procedure. A Minimum amount of bleeding was controlled with Pressure. The procedure was tolerated well with a pain level of 0 throughout and a pain level of 0 following the procedure. Post Debridement Measurements: 9cm length x 6cm width x 0.3cm depth; 12.723cm^3 volume. Character of Wound/Ulcer Post Debridement is improved. Post procedure Diagnosis Wound #6: Same as Pre-Procedure General Notes: Scribed for [name] PA, by J.Scotton. Pre-procedure diagnosis of Wound #6 is a Lymphedema located on the Right,Medial Lower Leg . There was a Four Layer Compression Therapy Procedure by [name], RN. Post procedure Diagnosis Wound #6: Same as Pre-Procedure Wound #7 Pre-procedure diagnosis of Wound #7 is a Lymphedema located on the Left,Medial Lower Leg . There was a Excisional Skin/Subcutaneous Tissue Debridement with a total area of 16.2 sq cm performed by [name], PA. With the following instrument(s): Curette to remove Viable and Non-Viable tissue/material. Material removed includes Subcutaneous Tissue and Slough and after achieving pain control using Lidocaine 4% T opical Solution. No specimens were taken. A time out was conducted at 15:27, prior to the start of the procedure. A Minimum amount of bleeding  was controlled with Pressure. The procedure was tolerated well with a pain level of 0 throughout and a pain level of 0 following the procedure. Post Debridement Measurements: 4.3cm length x 4.8cm width x 0.2cm depth; 3.242cm^3 volume. Character of Wound/Ulcer Post Debridement is improved. Post procedure Diagnosis Wound #7: Same as Pre-Procedure General Notes: Scribed for [name] PA, by J.Scotton. Pre-procedure diagnosis of Wound #7 is a Lymphedema located on the Left,Medial Lower Leg . There was a Four Layer Compression Therapy Procedure by [name], RN. Post procedure Diagnosis Wound #7: Same as Pre-Procedure Wound #11 Pre-procedure diagnosis of Wound #11 is a Lymphedema located on the Left,Posterior Lower Leg . There was a Four Layer Compression Therapy Procedure by [name], RN. Post procedure Diagnosis Wound #11: Same as Pre-Procedure Wound #13 Pre-procedure diagnosis of Wound #13 is a Lymphedema located on the Left,Lateral Lower Leg . There was a Four Layer Compression Therapy Procedure by [name], RN. Post procedure Diagnosis Wound #13: Same as Pre-Procedure Plan Follow-up Appointments: Return Appointment in 1 week. [name] PA Wednesday ****extra time 60 minutes**** 01/14/23 at  3pm Return Appointment in 2 weeks. [name] PA 01/21/23 at 3pm Return appointment in 3 weeks. - Please see front desk to schedule appointment Return appointment in 1 month. - Please see front desk to schedule appointment Other: - Please ask your Primary Care Practioner or Foot Doctor to order orthotic shoes. Anesthetic: (In clinic) Topical Lidocaine 5% applied to wound bed Bathing/ Shower/ Hygiene: May shower with protection but do not get wound dressing(s) wet. Protect dressing(s) with water repellant cover (for example, large plastic bag) or a cast cover and may then take shower. Edema Control - Lymphedema / SCD / Other: Lymphedema Pumps. Use Lymphedema pumps on leg(s) 2-3  times a day for 45-60 minutes. If wearing any wraps or hose, do not remove them. Continue exercising as instructed. - *****pump 3 times a day for an hour each time.***** Elevate legs to the level of the heart or above for 30 minutes daily and/or when sitting for 3-4 times a day throughout the day. - +++ Important+++ ELEVATE THE LEGS>>>>>>>>>> Avoid standing for long periods of time. Exercise regularly Compression stocking or Garment 30-40 mm/Hg pressure to: - use juxtafits to both legs daily if compression wraps removed Other Edema Control Orders/Instructions: - When sitting please keep your feet up, keep legs at Heart Level or above. Use pillows behind calf to for comfort. Off-Loading: Other: - When sitting please keep your feet up, keep legs at Heart Level or above. Use pillows behind calf to for comfort. Additional Orders / Instructions: Follow Nutritious Diet - Try and increase protein intake. Goal of protein intake is 60g-100g. WOUND #11: - Lower Leg Wound Laterality: Left, Posterior Cleanser: Soap and Water 1 x Per Week/30 Days Discharge Instructions: May shower and wash wound with dial antibacterial soap and water prior to dressing change. Peri-Wound Care: Sween Lotion (Moisturizing lotion) 1 x Per Week/30 Days Discharge Instructions: Apply moisturizing lotion as directed Topical: Keystone antibiotic spray 1 x Per Week/30 Days Danis, [name] ([id number]) [id number] Page 15 of 16 Prim Dressing: Maxorb Extra Ag+ Alginate Dressing, 4x4.75 (in/in) 1 x Per Week/30 Days ary Discharge Instructions: Apply to wound bed as instructed Secondary Dressing: ABD Pad, 8x10 1 x Per Week/30 Days Discharge Instructions: Apply over primary dressing as directed. Secondary Dressing: OptiLock Super Absorbent, 5x5.5 (in/in) (Generic) 1 x Per Week/30 Days Discharge Instructions: Apply directly to wound bed as directed Com pression Wrap: Urgo K2, (equivalent to a 4 layer) two  layer compression system, regular 1 x Per Week/30 Days Discharge Instructions: Apply Urgo K2 as directed (alternative to 4 layer compression). WOUND #12: - Lower Leg Wound Laterality: Left, Anterior Cleanser: Soap and Water 1 x Per Week/30 Days Discharge Instructions: May shower and wash wound with dial antibacterial soap and water prior to dressing change. Peri-Wound Care: Sween Lotion (Moisturizing lotion) 1 x Per Week/30 Days Discharge Instructions: Apply moisturizing lotion as directed Topical: Keystone antibiotic spray 1 x Per Week/30 Days Prim Dressing: Maxorb Extra Ag+ Alginate Dressing, 4x4.75 (in/in) 1 x Per Week/30 Days ary Discharge Instructions: Apply to wound bed as instructed Secondary Dressing: ABD Pad, 8x10 1 x Per Week/30 Days Discharge Instructions: Apply over primary dressing as directed. Secondary Dressing: OptiLock Super Absorbent, 5x5.5 (in/in) (Generic) 1 x Per Week/30 Days Discharge Instructions: Apply directly to wound bed as directed Com pression Wrap: Urgo K2, (equivalent to a 4 layer) two layer compression system, regular 1 x Per Week/30 Days Discharge Instructions: Apply Urgo K2 as directed (alternative to 4 layer compression). WOUND #13: -  Lower Leg Wound Laterality: Left, Lateral Cleanser: Soap and Water 1 x Per Week/30 Days Discharge Instructions: May shower and wash wound with dial antibacterial soap and water prior to dressing change. Peri-Wound Care: Sween Lotion (Moisturizing lotion) 1 x Per Week/30 Days Discharge Instructions: Apply moisturizing lotion as directed Topical: Keystone antibiotic spray 1 x Per Week/30 Days Prim Dressing: Maxorb Extra Ag+ Alginate Dressing, 4x4.75 (in/in) 1 x Per Week/30 Days ary Discharge Instructions: Apply to wound bed as instructed Secondary Dressing: ABD Pad, 8x10 1 x Per Week/30 Days Discharge Instructions: Apply over primary dressing as directed. Secondary Dressing: OptiLock Super Absorbent, 5x5.5 (in/in) (Generic) 1  x Per Week/30 Days Discharge Instructions: Apply directly to wound bed as directed Com pression Wrap: Urgo K2, (equivalent to a 4 layer) two layer compression system, regular 1 x Per Week/30 Days Discharge Instructions: Apply Urgo K2 as directed (alternative to 4 layer compression). WOUND #6: - Lower Leg Wound Laterality: Right, Medial Cleanser: Soap and Water 1 x Per Week/30 Days Discharge Instructions: May shower and wash wound with dial antibacterial soap and water prior to dressing change. Peri-Wound Care: Sween Lotion (Moisturizing lotion) 1 x Per Week/30 Days Discharge Instructions: Apply moisturizing lotion as directed Topical: Keystone antibiotic spray 1 x Per Week/30 Days Prim Dressing: Maxorb Extra Ag+ Alginate Dressing, 4x4.75 (in/in) 1 x Per Week/30 Days ary Discharge Instructions: Apply to wound bed as instructed Secondary Dressing: ABD Pad, 8x10 1 x Per Week/30 Days Discharge Instructions: Apply over primary dressing as directed. Secondary Dressing: OptiLock Super Absorbent, 5x5.5 (in/in) (Generic) 1 x Per Week/30 Days Discharge Instructions: Apply directly to wound bed as directed Com pression Wrap: Urgo K2, (equivalent to a 4 layer) two layer compression system, regular 1 x Per Week/30 Days Discharge Instructions: Apply Urgo K2 as directed (alternative to 4 layer compression). WOUND #7: - Lower Leg Wound Laterality: Left, Medial Cleanser: Soap and Water 1 x Per Week/30 Days Discharge Instructions: May shower and wash wound with dial antibacterial soap and water prior to dressing change. Peri-Wound Care: Sween Lotion (Moisturizing lotion) 1 x Per Week/30 Days Discharge Instructions: Apply moisturizing lotion as directed Topical: Keystone antibiotic spray 1 x Per Week/30 Days Prim Dressing: Maxorb Extra Ag+ Alginate Dressing, 4x4.75 (in/in) 1 x Per Week/30 Days ary Discharge Instructions: Apply to wound bed as instructed Secondary Dressing: ABD Pad, 8x10 1 x Per Week/30  Days Discharge Instructions: Apply over primary dressing as directed. Secondary Dressing: OptiLock Super Absorbent, 5x5.5 (in/in) (Generic) 1 x Per Week/30 Days Discharge Instructions: Apply directly to wound bed as directed Com pression Wrap: Urgo K2, (equivalent to a 4 layer) two layer compression system, regular 1 x Per Week/30 Days Discharge Instructions: Apply Urgo K2 as directed (alternative to 4 layer compression). 1. I am going to recommend that we have the patient continue to monitor for any evidence of infection or worsening. Based on what I am seeing I do believe that we will making headway towards complete closure he actually seems much better I did perform some debridement today postdebridement he looks to be doing much better. 2. I am going to recommend as well that the patient continue with the topical Jaynee Eagles to biotics which is doing well. 3. Also go ahead and get the Levaquin and start that based on the culture results that should be of great benefit for him. 4. I am also going to recommend that he continue with the Urgo K2 wraps along with the OPTi lock superabsorbent and the ABD pads with  the alginate which seems to have done extremely well for him at this point. We will see patient back for reevaluation in 1 week here in the clinic. If anything worsens or changes patient will contact our office for additional recommendations. Electronic Signature(s) Signed: 01/07/2023 7:54:18 PM By: [name] RN, [name] Signed: 01/09/2023 1:58:20 PM By: [name] PA-C Previous Signature: 01/07/2023 3:39:33 PM Version By: [name] PA-C Entered By: [name] on 01/07/2023 16:40:13 Pines, [name] ([id number]) [id number] Page 16 of 16 -------------------------------------------------------------------------------- SuperBill Details Patient Name: Date of Service: Battin, [name] 01/07/2023 Medical Record Number: [id number] Patient Account Number:  [id number] Date of Birth/Sex: Treating RN: 1945/10/09 (77 y.o. M) Primary Care Provider: [name] Other Clinician: Referring Provider: Treating Provider/Extender: [name] Weeks in Treatment: 61 Diagnosis Coding ICD-10 Codes Code Description [id number] Chronic venous hypertension (idiopathic) with ulcer and inflammation of bilateral lower extremity I89.0 Lymphedema, not elsewhere classified 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 other part of right lower leg with other specified severity L84 Corns and callosities Facility Procedures : CPT4 Code: [id number] Description: 11042 - DEB SUBQ TISSUE 20 SQ CM/< ICD-10 Diagnosis Description L97.828 Non-pressure chronic ulcer of other part of left lower leg with other specified 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 other part of right lower leg with other specified Modifier: severity severity Quantity: 1 : CPT4 Code: [id number] Description: 11045 - DEB SUBQ TISS EA ADDL 20CM ICD-10 Diagnosis Description L97.828 Non-pressure chronic ulcer of other part of left lower leg with other specified 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 other part of right lower leg with other specified Modifier: severity severity Quantity: 2 Physician Procedures : CPT4 Code Description Modifier [id number] 11042 - WC PHYS SUBQ TISS 20 SQ CM ICD-10 Diagnosis Description 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 Quantity: 1 : [id number] 11045 - WC PHYS SUBQ TISS EA ADDL 20 CM ICD-10 Diagnosis Description 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 other part of right lower leg with other  specified severity Quantity:  2 Electronic Signature(s) Signed: 01/07/2023 3:39:47 PM By: [name] PA-C Entered By: [name] on 01/07/2023 12:39:46

## 2023-01-07 NOTE — Progress Notes (Signed)
Betsch, [name] ([id number]) [id number] Page 1 of 7 Visit Report for 01/07/2023 Arrival Information Details Patient Name: Date of Service: [name] 01/07/2023 2:00 PM Medical Record Number: [id number] Patient Account Number: [id number] Date of Birth/Sex: Treating RN: 01/31/1946 (77 y.o. M) Primary Care Pebbles Zeiders: [name] Other Clinician: Referring Xhaiden Coombs: Treating Nataley Bahri/Extender: [name] in Treatment: 65 Visit Information History Since Last Visit Added or deleted any medications: No Patient Arrived: Cane Any new allergies or adverse reactions: No Arrival Time: 14:38 Had a fall or experienced change in No Accompanied By: self activities of daily living that may affect Transfer Assistance: None risk of falls: Patient Identification Verified: Yes Signs or symptoms of abuse/neglect since last visito No Secondary Verification Process Completed: Yes Hospitalized since last visit: No Patient Requires Transmission-Based Precautions: No Implantable device outside of the clinic excluding No Patient Has Alerts: Yes cellular tissue based products placed in the center Patient Alerts: Patient on Blood Thinner since last visit: Right ABI in clinic [place] Has Dressing in Place as Prescribed: Yes Has Compression in Place as Prescribed: Yes Pain Present Now: No Electronic Signature(s) Unsigned Entered By: Thayer Dallas on 01/07/2023 12:02:10 -------------------------------------------------------------------------------- Lower Extremity Assessment Details Patient Name: Date of Service: [name] 01/07/2023 2:00 PM Medical Record Number: [id number] Patient Account Number: [id number] Date of Birth/Sex: Treating RN: December 21, 1945 (77 y.o. M) Primary Care Maximino Cozzolino: [name] Other Clinician: Referring Gurkaran Rahm: Treating Jensine Luz/Extender: Leveda Anna, [name] Weeks in Treatment: 41 Edema  Assessment Assessed: [Left: No] [Right: No] Edema: [Left: Yes] [Right: Yes] Calf Left: Right: Point of Measurement: 40 cm From Medial Instep 55.5 cm 64 cm Ankle Left: Right: Point of Measurement: 13 cm From Medial Instep 38 cm 39 cm Vascular Assessment Extremity colors, hair growth, and conditions: Anding, Jadrien ([id number]) [Right:[id number] Page 2 of 7] Extremity Color: [Left:Hyperpigmented] [Right:Hyperpigmented] Hair Growth on Extremity: [Left:No] Temperature of Extremity: [Left:Warm] [Right:Warm] Capillary Refill: [Left:> 3 seconds] [Right:> 3 seconds] Dependent Rubor: [Left:No Yes] [Right:No Yes] Electronic Signature(s) Unsigned Entered By: Thayer Dallas on 01/07/2023 12:02:58 -------------------------------------------------------------------------------- Multi Wound Chart Details Patient Name: Date of Service: [name] 01/07/2023 2:00 PM Medical Record Number: [id number] Patient Account Number: [id number] Date of Birth/Sex: Treating RN: August 24, 1945 (77 y.o. M) Primary Care Parnell Spieler: [name] Other Clinician: Referring Lawrencia Mauney: Treating Dennys Traughber/Extender: Leveda Anna, [name] Weeks in Treatment: 60 [Treatment Notes:Wound Assessments Treatment Notes] Electronic Signature(s) Signed: 01/07/2023 2:26:02 PM By: [name] PA-C Entered By: [name] on 01/07/2023 11:26:02 -------------------------------------------------------------------------------- Pain Assessment Details Patient Name: Date of Service: [name] 01/07/2023 2:00 PM Medical Record Number: [id number] Patient Account Number: [id number] Date of Birth/Sex: Treating RN: [birth year] (77 y.o. M) Primary Care Maylyn Narvaiz: [name] Other Clinician: Referring Griff Badley: Treating Marqus Macphee/Extender: [name] Weeks in Treatment: 31 Active Problems Location of Pain Severity and Description of Pain Patient Has Paino No Site  Locations [place], [facility] ([id number]) [id number] Page 3 of 7 Pain Management and Medication Current Pain Management: Electronic Signature(s) Unsigned Entered By: Thayer Dallas on 01/07/2023 12:02:37 -------------------------------------------------------------------------------- Wound Assessment Details Patient Name: Date of Service: [name] 01/07/2023 2:00 PM Medical Record Number: [id number] Patient Account Number: [id number] Date of Birth/Sex: Treating RN: 07-20-1945 (77 y.o. [name] Primary Care Aprel Egelhoff: [name] Other Clinician: Referring Jlee Harkless: Treating Jaliyah Fotheringham/Extender: [name] Weeks in Treatment: 46 Wound Status Wound Number: 11 Primary Lymphedema Etiology: Wound Location: Left, Posterior Lower Leg Wound  Open Wounding Event: Gradually Appeared Status: Date Acquired: 10/19/2022 Comorbid Anemia, Lymphedema, Deep Vein Thrombosis, Hypertension, Weeks Of Treatment: 11 History: Received Radiation Clustered Wound: No Wound Measurements Length: (cm) 0.5 Width: (cm) 0.5 Depth: (cm) 0.1 Area: (cm) 0.196 Volume: (cm) 0.02 % Reduction in Area: 98.4% % Reduction in Volume: 98.4% Epithelialization: Large (67-100%) Tunneling: No Undermining: No Wound Description Classification: Full Thickness Without Exposed Suppor Wound Margin: Distinct, outline attached Exudate Amount: Medium Exudate Type: Serous Exudate Color: amber t Structures Foul Odor After Cleansing: No Slough/Fibrino No Wound Bed Granulation Amount: Small (1-33%) Exposed Structure Granulation Quality: Red Fascia Exposed: No Necrotic Amount: Large (67-100%) Fat Layer (Subcutaneous Tissue) Exposed: No Necrotic Quality: Adherent Slough Tendon Exposed: No Muscle Exposed: No Joint Exposed: No [name] ([id number]) [id number] Page 4 of 7 Bone Exposed: No Periwound Skin Texture Texture Color No  Abnormalities Noted: Yes No Abnormalities Noted: Yes Moisture Temperature / Pain No Abnormalities Noted: No Temperature: No Abnormality Dry / Scaly: Yes Maceration: No Electronic Signature(s) Unsigned Entered By: [name] on 01/07/2023 12:07:29 -------------------------------------------------------------------------------- Wound Assessment Details Patient Name: Date of Service: [place], [name] 01/07/2023 2:00 PM Medical Record Number: [id number] Patient Account Number: [id number] Date of Birth/Sex: Treating RN: Feb 20, 1946 (77 y.o. [name] Primary Care Merleen Picazo: [name] Other Clinician: Referring Wanette Robison: Treating Torrance Frech/Extender: [name] Weeks in Treatment: 2 Wound Status Wound Number: 12 Primary Lymphedema Etiology: Wound Location: Left, Anterior Lower Leg Wound Open Wounding Event: Frostbite Status: Date Acquired: 12/17/2022 Comorbid Anemia, Lymphedema, Deep Vein Thrombosis, Hypertension, Weeks Of Treatment: 3 History: Received Radiation Clustered Wound: No Wound Measurements Length: (cm) 2.3 Width: (cm) 4 Depth: (cm) 0.2 Area: (cm) 7.226 Volume: (cm) 1.445 % Reduction in Area: -104.5% % Reduction in Volume: -104.4% Epithelialization: Small (1-33%) Tunneling: No Undermining: No Wound Description Classification: Full Thickness Without Exposed Suppor Wound Margin: Distinct, outline attached Exudate Amount: Large Exudate Type: Serous Exudate Color: amber t Structures Foul Odor After Cleansing: No Slough/Fibrino Yes Wound Bed Granulation Amount: Small (1-33%) Exposed Structure Granulation Quality: Red Fascia Exposed: No Necrotic Amount: Large (67-100%) Fat Layer (Subcutaneous Tissue) Exposed: Yes Necrotic Quality: Adherent Slough Tendon Exposed: No Muscle Exposed: No Joint Exposed: No Bone Exposed: No Periwound Skin Texture Texture Color No Abnormalities Noted: No No Abnormalities Noted: No Callus:  No Atrophie Blanche: No Crepitus: No Cyanosis: No Excoriation: No Ecchymosis: No Induration: No Erythema: No Rash: No Hemosiderin Staining: Yes Scarring: No Mottled: No Pallor: No Smits, Keone ([id number]) [id number] Page 5 of 7 Pallor: No Moisture Rubor: No No Abnormalities Noted: No Dry / Scaly: No Temperature / Pain Maceration: No Temperature: No Abnormality Electronic Signature(s) Unsigned Entered By: [name] on 01/07/2023 12:07:59 -------------------------------------------------------------------------------- Wound Assessment Details Patient Name: Date of Service: [place], [name] 01/07/2023 2:00 PM Medical Record Number: [id number] Patient Account Number: [id number] Date of Birth/Sex: Treating RN: 1945-07-21 (77 y.o. [name] Primary Care Jahnai Slingerland: [name] Other Clinician: Referring Xavian Hardcastle: Treating Avenly Roberge/Extender: [name] Weeks in Treatment: 80 Wound Status Wound Number: 13 Primary Lymphedema Etiology: Wound Location: Left, Lateral Lower Leg Wound Open Wounding Event: Gradually Appeared Status: Date Acquired: 12/24/2022 Comorbid Anemia, Lymphedema, Deep Vein Thrombosis, Hypertension, Weeks Of Treatment: 2 History: Received Radiation Clustered Wound: No Wound Measurements Length: (cm) 0.5 Width: (cm) 0.5 Depth: (cm) 0.1 Area: (cm) 0.196 Volume: (cm) 0.02 % Reduction in Area: 83.8% % Reduction in Volume: 83.5% Epithelialization: Small (1-33%) Tunneling: No Wound Description Classification: Full Thickness Without Exposed Suppor Wound  Margin: Flat and Intact Exudate Amount: Medium Exudate Type: Serous Exudate Color: amber t Structures Foul Odor After Cleansing: No Slough/Fibrino Yes Wound Bed Granulation Amount: Small (1-33%) Exposed Structure Granulation Quality: Pink Fascia Exposed: No Necrotic Amount: Large (67-100%) Fat Layer (Subcutaneous Tissue) Exposed:  Yes Necrotic Quality: Eschar, Adherent Slough Tendon Exposed: No Muscle Exposed: No Joint Exposed: No Bone Exposed: No Periwound Skin Texture Texture Color No Abnormalities Noted: Yes No Abnormalities Noted: No Hemosiderin Staining: Yes Moisture No Abnormalities Noted: Yes Temperature / Pain Temperature: No Abnormality Electronic Signature(s) Unsigned Entered By: [name] on 01/07/2023 12:08:26 Signature(s): Roots, [name] ([id number]) [id number] Date(s): [id number] Page 6 of 7 -------------------------------------------------------------------------------- Wound Assessment Details Patient Name: Date of Service: [place], DA [name] 01/07/2023 2:00 PM Medical Record Number: [id number] Patient Account Number: [id number] Date of Birth/Sex: Treating RN: 07-Jun-1945 (77 y.o. [name] Primary Care Frazer Rainville: [name] Other Clinician: Referring Markail Diekman: Treating Issak Goley/Extender: [name] Weeks in Treatment: 59 Wound Status Wound Number: 6 Primary Lymphedema Etiology: Wound Location: Right, Medial Lower Leg Wound Open Wounding Event: Gradually Appeared Status: Date Acquired: 03/05/2022 Comorbid Anemia, Lymphedema, Deep Vein Thrombosis, Hypertension, Weeks Of Treatment: 41 History: Received Radiation Clustered Wound: Yes Wound Measurements Length: (cm) Width: (cm) Depth: (cm) Clustered Quantity: Area: (cm) Volume: (cm) 9 % Reduction in Area: 70.3% 6 % Reduction in Volume: 11% 0.3 Epithelialization: None 1 Tunneling: No 42.412 Undermining: No 12.723 Wound Description Classification: Full Thickness Without Exposed Sup Wound Margin: Distinct, outline attached Exudate Amount: Large Exudate Type: Serous Exudate Color: amber port Structures Foul Odor After Cleansing: No Slough/Fibrino Yes Wound Bed Granulation Amount: Small (1-33%) Exposed Structure Granulation Quality: Pink Fascia Exposed: No Necrotic  Amount: Large (67-100%) Fat Layer (Subcutaneous Tissue) Exposed: Yes Necrotic Quality: Eschar, Adherent Slough Tendon Exposed: No Muscle Exposed: No Joint Exposed: No Bone Exposed: No Periwound Skin Texture Texture Color No Abnormalities Noted: No No Abnormalities Noted: No Callus: No Atrophie Blanche: No Crepitus: No Cyanosis: No Excoriation: No Ecchymosis: No Induration: No Erythema: No Rash: No Hemosiderin Staining: Yes Scarring: Yes Mottled: No Pallor: No Moisture Rubor: No No Abnormalities Noted: No Dry / Scaly: No Temperature / Pain Maceration: No Temperature: No Abnormality Electronic Signature(s) Unsigned Entered By: [name] on 01/07/2023 12:09:08 Signature(s): Conery, [name] ([id number]) [id number] Date(s): [id number] Page 7 of 7 -------------------------------------------------------------------------------- Vitals Details Patient Name: Date of Service: [place], DA [name] 01/07/2023 2:00 PM Medical Record Number: [id number] Patient Account Number: [id number] Date of Birth/Sex: Treating RN: [birth year] (77 y.o. M) Primary Care Gunhild Bautch: [name] Other Clinician: Referring Matison Nuccio: Treating Justo Hengel/Extender: [name] Weeks in Treatment: 76 Vital Signs Time Taken: 15:04 Temperature (F): 98 Height (in): 74 Pulse (bpm): 83 Weight (lbs): 250 Respiratory Rate (breaths/min): 18 Body Mass Index (BMI): 32.1 Blood Pressure (mmHg): 194/80 Reference Range: 80 - 120 mg / dl Electronic Signature(s) Unsigned Entered By: Thayer Dallas on 01/07/2023 12:04:49 Signature(s): Date(s):

## 2023-01-14 ENCOUNTER — Encounter (HOSPITAL_BASED_OUTPATIENT_CLINIC_OR_DEPARTMENT_OTHER): Payer: Medicare Other | Admitting: Internal Medicine

## 2023-01-14 DIAGNOSIS — I890 Lymphedema, not elsewhere classified: Secondary | ICD-10-CM | POA: Diagnosis not present

## 2023-01-15 NOTE — Progress Notes (Signed)
[name], [name] ([id number]) [id number] Page 1 of 12 Visit Report for 01/14/2023 HPI Details Patient Name: Date of Service: [name], [name] 01/14/2023 3:00 PM Medical Record Number: [id number] Patient Account Number: [id number] Date of Birth/Sex: Treating RN: 1945/09/07 (77 y.o. M) Primary Care Provider: Ricki [name] Other Clinician: Referring Provider: Treating Provider/Extender: [name] Weeks in Treatment: 71 History of Present Illness HPI Description: ADMISSION 02/23/2020 Patient is a 77 year old man who lives in [place] who arrives accompanied by his wife. He has a history of chronic lymphedema and venous insufficiency in his bilateral lower legs which may have something to do that with having a history of DVT as well as being treated for prostate cancer. In any case he recently got compression pumps at home but compliance has been an issue here. He has compression stockings however they are probably not sufficient enough to control swelling. They tell us that things deteriorated for him in late August he was admitted to [facility] for 7 days. This was with cellulitis I think of his bilateral lower legs. Discharge he was noted to have wounds on his bilateral lower legs. He was discharged on Bactrim. They tried to get him home health through [facility] of course they declined him. His wife is been wrapping these applying some form of silver foam dressing. He has a history of wounds before although nothing that would not heal with basic home topical dressings. He has 2 areas on the left medial, left anterior and left lateral and a smaller area on the right medial. All of these have considerable depth. Past medical history includes iron deficiency anemia, lymphedema followed by the rehab center at [facility] with lymphedema wraps I believe, DVT on chronic anticoagulation, prostate cancer, chronic venous  insufficiency, hypertension. As mentioned he has compression pumps but does not use them. ABIs in our clinic were noncompressible bilaterally 10/14; patient with severe bilateral lymphedema right greater than left. He came in with bilateral lower extremity wounds left greater than right. Even though the right side has more of the edema most of the wounds here almost closed on the right medial. He has 3 remaining wounds on the left We have been using silver alginate under 4-layer compression I have been trying to get him to be compliant with his external compression pumps 10/21; patient with 3 small wounds on the left leg and 1 on the right medial in the setting of severe lymphedema and chronic venous insufficiency. We have been using silver alginate under 4-layer compression he is using his external compression pumps twice a day 11/4; ARTERIAL STUDIES on the right show an ABI of 1.02 TBI of 0.858 with biphasic waveforms on the left 0.98 with a TBI of 0.55 and biphasic waveforms. Does not look like he has significant arterial disease. We are treating him for lymphedema he has compression pumps. He has punched-out areas on the left anterior left lateral and right medial lower extremities 11/11; after we obtained his arterial studies I put him in 4 layer compression. He is using his compression pumps probably once a day although I have asked him to do twice. Primary dressing to the wound is silver collagen he has severe lymphedema likely secondary to chronic venous insufficiency. Wounds on the left lateral, left medial and left anterior and a small area on the right medial 12/2; the area on the right anterior lower leg has healed. We initially thought that the area medially had healed as  well however when her discharge nurse came in she detected fluid in the wound simply opened up. This is actually worse than I remember this pain. The area on the left lateral potentially slightly smaller He is also  complaining about pain in his left hand he says that this is actually been getting some better he has been using topical creams on this. She asked that I look at this 12/9 after last weeks issues we have 2 wounds one on the right medial lower leg and 1 on the left lateral. Both of these are in the same condition. I think because of thickened skin secondary to chronic lymphedema these wounds actually have depth of almost 0.8 cm. 12/16; the patient has 2 small but deep wounds one on the right medial and one on the left lateral. The right medial is actually the worst of these. He arrives in clinic today with absolutely terrible edema in the right leg apparently his 4-layer wrap fell down to just above his ankle he did not think about this he is apparently been continuing to use his compression pump twice a day. The left leg looks a lot better. 05/09/2020 upon evaluation today patient appears to be doing decently well in regard to his wounds. Everything is measuring smaller the right leg still has a little bit deeper wound in the left seems to be almost completely healed in my opinion I am very pleased in general with how things are progressing. He has a 4- layer compression wrap we have been using endoform today we will probably have to use collagen just based on the fact that we do not have endoform it is on order. 1/6; the patient's wound on the left lateral lower leg has healed. Still has 1 on the right medial. 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 Using compression pumps at home twice a day. 1/13; left lateral lower leg is still healed. He has a deep punched out rectangular shaped wound on the right medial calf. Looking down at this it appears that he is attempting to epithelialize around the edges of the wound and on the base as well. His edema is reasonably well controlled we have been using collagen with absolutely no effect 1/20; left lateral lower leg remains closed he has  extremitease stockings. The area on the right medial calf I aggressively debrided last week measures larger but the surface looks better. We have been using Hydrofera Blue. We ran Oasis through his insurance but we have not seen the results of this 1/27; left lower leg wound with chronic venous insufficiency and secondary lymphedema. I did aggressive debridement on this last week the wound seems to have come in healthy looking surface using Hydrofera Blue. He was denied for Oasis 2/3; small divot in the right medial lower leg. Under illumination the walls of this divot are epithelialized however the base has slough which I removed with a curette we have been using Hydrofera Blue 2/10 small divot on the right medial lower leg pinpoint illumination at the base of this cone-shaped wound. We have been using Hydrofera Blue but I will switch to calcium alginate this week 2/17; the small divot on the right medial lower leg is fully epithelialized. There is no visible open area under illumination. He has his own stocking for the right leg similar to the one he has been wearing on the left. [name], [name] ([id number]) [id number] Page 2 of 12 03/26/2022; READMISSION This is a now 77 year old man that  we had in the clinic from 02/23/2020 through 07/05/2020. At that point he had bilateral lower extremity wounds left greater than right in the setting of severe lymphedema. He had already obtained compression pumps ordered for him I think from the wound care clinic in [facility] so I do not really have record of what he has been using. He claims to be using them once a day but there is a problem with the sleeve on the left leg. About 2 weeks ago he was hospitalized from 03/11/2022 through 03/14/2022 with diastolic congestive heart failure. 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 He was diuresed. Developed some prerenal azotemia  and he has not been taking any diuretics currently. He has not been putting stockings on his legs since he got out of hospital and still has his legs dependent for long periods. Past medical history history of prostate cancer treated with prostatectomy and radiation this was apparently about 8 years ago, history of DVT on chronic Coumadin, history of lymphedema was managed for a while at the clinic in [place]. History of inguinal hernia repair in September 22, hypertension, stage IIIb chronic renal failure ABIs today were noncompressible on the right 1.12 on the left 04-02-2022 upon evaluation today patient appears to be doing well currently in regard to his legs I do feel like both areas that are draining are actually much drier than they were in the picture last week although the left is drier than the right. He is tolerating the 4-layer compression wraps at this point he did contact the pump company and they are actually working on getting him a new compression sleeve for one of his legs which have previously popped and was not functioning properly. 04-23-2022 upon evaluation today patient appears to be doing well currently in regard to his wounds on the legs. I am actually very pleased with where things stand and I do feel like that we are headed in the right direction. Fortunately there is no sign of active infection locally or systemically at this time. 05-07-2022 upon evaluation today patient appears to be doing well currently in regard to his wounds in fact things are showing signs of improvement which is good news I do not see too much that actually appears to be open and I am very pleased in that regard. No fevers, chills, nausea, vomiting, or diarrhea. 05-21-2022 upon evaluation today patient appears to be doing somewhat poorly in regard to drainage of his lower extremities bilaterally. The right is greater than left as far as the weeping area. Nonetheless it seems to be getting worse not  better. He actually has pitting edema which is at least 2+ to the thighs and I am concerned about the fact that he is may be fluid overloaded in general and that is the reason why we cannot get this under control. I know he is not using his pumps all the time because he actually told the nurse that he was either going to pump or he was going to use his fluid pills but not do both. For that reason I do think that he needs to be really doing both in order to get the fluid out as effectively as possible obviously with the 4-layer compression wraps were doing as much as we can from a compression standpoint but it is really not enough. He tells me that he elevates his leg is much as he can in between pumping and other  activity throughout the day. 05-28-2022 upon evaluation today patient appears to be doing better in regard to his wounds although the measurements may be a little bit larger this is a very difficult wound to heal it is very indistinct in a lot of areas. Nonetheless there is can be some need for sharp debridement in regard to both medial and lateral legs. Fortunately I see no signs of active infection locally nor systemically at this time. No fevers, chills, nausea, vomiting, or diarrhea. 06-04-2022 upon evaluation today patient appears to be doing poorly in general in regard to the wounds on his legs. He still continues to have a tremendous amount of fluid not just in the lower portion of his leg but to be honest his thigh where he has 2-3+ pitting edema in the thigh as well. Unfortunately I do not know that we will be able to get this healed effectively and keep it healed on the lower extremities unless he gets the overall fluid situation taking and under control. Fortunately I do not see any signs of infection locally nor systemically which is great news. He just seems to be very fluid overloaded. 06-11-2022 upon evaluation today patient presents for follow-up concerning his bilateral lower  extremity lymphedema secondary to chronic venous insufficiency. He has been tolerating the dressing changes with the compression wraps without complication. Fortunately I do not see any evidence of infection at this time which is great news. No fevers, chills, nausea, vomiting, or diarrhea. 06-18-2022 upon evaluation today patient appears to be doing well currently in regard to his wounds as far as not looking like they are terribly infected but nonetheless I am concerned about a subacute infection secondary to the fact that he continues to have spreading despite the compression therapy. We actually did do an Unna boot on him last week this is actually the first wrap that actually stayed up everything else has been sliding down quite significantly. Fortunately there does not appear to be any signs of infection systemically at this time. With that being said I do believe that locally there seems to be an issue going on here and again I Ernie Hew do a PCR culture to see what that shows also think that I am going to put him on a broad-spectrum antibiotic, doxycycline to see how that will help as well. He does tell me that coming into the clinic today that he was feeling short of breath like "he was about to have a heart attack" because he was having such a hard time breathing. He says that he told this to Dr. Jodelle Green his cardiologist as well when he was evaluated in the past 1 to 2 weeks. 07-02-2022 upon evaluation today patient appears to be doing poorly currently in regard to his wound. He has been tolerating the dressing changes. Unfortunately he has not had any compression wraps on for the past week because he was unable to make it in for his appointment last week. With that being said he has a significant amount of drainage he tells me has been using his pumps but despite this in the pumps he still has been draining quite a bit. The drainage is also somewhat purulent unfortunately. We did attempt to get in  touch with his cardiologist last week unfortunately we were unable to get up with him I did advise that the patient needs to get in touch with him upon leaving today in order to make sure they know he is on the new antibiotics I  am going to send him this will be Levaquin and Augmentin. 07-09-2022 upon evaluation today patient appears to be doing about the same in regard to his legs he may have just a slight amount of improvement with regard to the drainage probably Keystone topical antibiotics are helping in this regard to some degree. Fortunately there does not appear to be any signs of active infection systemically which is great news. No fevers, chills, nausea, vomiting, or diarrhea. 07-16-2022 upon evaluation today patient appears to be doing well currently in regard to his wound. He has been tolerating the dressing changes without complication. Fortunately there does not appear to be any signs of active infection locally nor systemically at this time. With that being said he cannot keep the wraps up he tells me on the left side he had to cut this down because it got too tight. He has been using his pumps but he is on the right side the wrap actually straight down causing some pushing around the central part of his leg just below the calf I think this is a bigger risk for him that help at this point. I think that we may need to try something different he should be getting his compression socks shortly he tells me they were ordered last Thursday. 3/6; ; this is a patient who lives in [place]. He has severe bilateral lymphedema. He has compression pumps, we have been using kerlix Ace wrap Keystone. He is changing the dressing. We do not have home health. 08-06-2022 upon evaluation today patient appears to be doing a little better in regard to his wounds in general at this point. Fortunately there does not appear to be any signs of active infection locally nor systemically at this time which is great  news and overall I am extremely pleased with where we stand today. 08-13-2022 upon evaluation today patient appears to actually be doing significantly better compared to last week. He actually did go to the hospital I told him that he needed to when he left here and he actually did go. With that being said they actually ended up admitting him he was having shortness of breath and I thought it might be related to congestive heart failure turns out he actually had a pulmonary embolism. Subsequently they were able to get him off of the Coumadin switching over to Eliquis to get things stabilized in that regard they also had them wrapped and got his swelling under control on his legs he actually looks much better pretty much across the board at this point. I am very pleased in that regard. With that being said I am very happy that he finally went that could have been a very dangerous situation. 08-20-2022 upon evaluation today patient appears to be doing well currently in regard to his wound. Has been tolerating the dressing changes without complication. Fortunately there does not appear to be any signs of active infection locally nor systemically at this time. I think his legs are doing better there is some need for sharp debridement today. [name], [name] ([id number]) [id number] Page 3 of 12 08-27-2022 upon evaluation patient is actually making excellent progress. I am actually very pleased with where he stands and I think that he is moving in the right direction. In general I think that we are looking pretty good at the moment. 09-03-2022 upon evaluation today patient appears to be doing well currently in regard to his wound. He is actually tolerating dressing changes on the left and right leg  without complication. Fortunately I do not see any need for debridement of the left leg the right leg I think we probably do some need to perform some debridement here. 09-10-2022 upon  evaluation today patient's wounds actually showed signs of improvement in both legs I do not see much is going require debridement today which was great news. Fortunately I do not see any evidence of infection which I think is also excellent he seems to be using his pumps and doing everything right I am happy about how this is progressing at this point. 09-17-2022 upon evaluation today patient appears to be doing decently well in regard to his wounds. He has been tolerating the dressing changes without complication. Fortunately there does not appear to be any signs of active infection at this time which is good news. 09-24-2022 upon evaluation today patient appears to be doing well currently in regard to his wounds. He has been tolerating the dressing changes without complication. Fortunately there does not appear to be any signs of active infection locally nor systemically which is great news. No fevers, chills, nausea, vomiting, or diarrhea. 10-08-2022 upon evaluation today patient appears to be doing excellent currently in regard to his wound. He has been tolerating the dressing changes without complication though it does not sound like he has been using his compression wraps for a bit here. He does think he was doing better with the [facility] topical antibiotics we can definitely go back to using that but I think the biggest issue here is that his swelling is just very out of control and needs to be under control. I discussed that with him today. 10-15-2022 upon evaluation today patient appears to be doing well currently in regard to his wounds. He is actually making some progress which is good news. Fortunately I do not see any evidence of active infection locally nor systemically which is great news as well. No fevers, chills, nausea, vomiting, or diarrhea. He does have a callused area on the plantar aspect of his left foot which is actually causing some pain and he wonders if I can trim this down for  him. 10-22-2022 upon evaluation today patient seems to be making progress. He is actually doing quite well and very pleased in that regard. I do not see any signs of active infection at this time. 11-05-2022 upon evaluation patient appears to be making progress although slowly towards closure. He seems to be doing well with regard to his legs were still using the [facility] topical antibiotics and he seems to be doing quite well. He is going require some sharp debridement today. 11-19-2022 upon evaluation today patient unfortunately appears to be extremely swollen at this point. He tells me that he ran out of supplies he also tells me his leg started leaking more because of not having supplies he was unable to wear his compression wraps that is the juxta fit compression wraps. Therefore his legs are extremely swollen much larger than normal and do not appear to be doing well at all today. He is going require some debridement I also think he is going require [place] to perform compression wrapping today. 11-26-2022 upon evaluation today patient appears to be doing well currently in regard to his legs as far as infection is concerned I see nothing that appears to be infected. Fortunately I do not see any signs of active infection systemically either which is also good news. With that being said he still is extremely swollen as far as his legs  are concerned. I do not see any signs of overall worsening but also do not see any signs of significant improvement which is the major issue here. 12-03-2022 upon evaluation today patient appears to be doing poorly still in regard to his legs the apparently has been taken off the wraps and not leaving the week by week. With that being said he has not had really the dressings to put on the release that is running out and subsequently though he is using his wraps I am not sure that he has been keeping them on like he needs to. I explained to him I really wanted to have the wraps  that I put on him left on until he comes back to see me so that we can keep the compression under better control. He voiced understanding today he tells me that he was "confused about that". Nonetheless based on what I am seeing right now also think that he is up on his feet too much I think he needs to get the feet elevated. 7/24; this is a patient with severe bilateral stage III lymphedema. On the right lower leg medially is a large area of macerated denuded skin was too small deeper areas in this. On the left leg he is 2 areas that are a little more standard in terms of wounds. Massive lymphedema bilaterally. He has compression pumps at home which he uses for 1 hour twice a day 12-17-2022 upon evaluation today patient's legs though a little bit smaller still continue to have significant issues with weeping here this just does not dry. I really think he needs to be changing this daily and this means we will probably have to try to go back to the Velcro wraps and give this a shot. I think may be going to the Velcro wraps, changing the dressing to a superabsorber like XtraSorb or the like, and have him elevate his legs and do his pumps 3 times per day is probably going to be the way to go to try to see if we can make some improvement here. 12-24-2022 upon evaluation today patient unfortunately is still struggling to get anything under control. We have been extremely aggressive and trying to: Control his swelling everything we do however seems to be met with resistance. He tells me that he is pumping 3 times per day with his lymphedema pumps. He also tells me that he is try to elevate his legs is much as possible and that subsequently he has been keeping his compression wraps on. He goes back and forth between saying that he can keep our wraps on and not but right now I really do not see that he is doing well with the juxta fit is at least not from the standpoint of improving the overall appearance I think  it does to some degree help maintain but we are still struggling here. I have voiced a concern to the patient that despite everything you are doing I am still not seeing a lot of improvement here and to be honest I am not sure what is can take get this under control he may have to go into the hospital for admission. 12-31-2022 upon evaluation today patient appears to be doing well currently in regard to his wounds. They seem a little better although he has several pustules around the legs in general but has been concerned about infection. I am actually going to go ahead and see about getting an antibiotic sent into the pharmacy today  although I am going to obtain a PCR culture as well. 01-07-2023 upon evaluation today patient appears to be doing well currently in regard to his wound. Has been tolerating the dressing changes without complication. He actually seems to be doing much better. He did have a PCR culture which showed Serratia as well and the prominent organisms here. Levaquin was actually a good option to treat the Serratia along with the other organisms that were problematic including E. coli. Nonetheless I think coupled with the topical Keystone antibiotics and the alginate he is really doing quite well today. 8/28; patient with severe bilateral lymphedema he has wounds bilaterally on his lower legs but they look a lot better today. We are using silver alginate and Keystone with Urgo K2 lite wraps. He comes in today with his wounds looking quite good. He was prescribed Levaquin last time I think he will complete this in a few days. Electronic Signature(s) Signed: 01/14/2023 5:08:25 PM By: Baltazar Najjar MD Entered By: Baltazar Najjar on 01/14/2023 16:12:08 [name], [name] ([id number]) [id number] Page 4 of 12 -------------------------------------------------------------------------------- Physical Exam Details Patient Name: Date of Service: [name], [name]  01/14/2023 3:00 PM Medical Record Number: [id number] Patient Account Number: [id number] Date of Birth/Sex: Treating RN: [birth year] (77 y.o. M) Primary Care Provider: Ricki [name] Other Clinician: Referring Provider: Treating Provider/Extender: [name] Weeks in Treatment: 61 Constitutional Patient is hypertensive.. Pulse regular and within target range for patient.Marland Kitchen Respirations regular, non-labored and within target range.. Temperature is normal and within the target range for the patient.Marland Kitchen Appears in no distress. Notes Wound exam; all of his wounds appear to be more superficial. There is still some debris on the surface but in review of him of this and the amount of edema in his legs I did not debride this. There is no evidence of surrounding infection. From talking with our intake nurse it seems like these are filling in. No evidence of concurrent infection Electronic Signature(s) Signed: 01/14/2023 5:08:25 PM By: Baltazar Najjar MD Entered By: Baltazar Najjar on 01/14/2023 16:13:06 -------------------------------------------------------------------------------- Physician Orders Details Patient Name: Date of Service: [name], [name] 01/14/2023 3:00 PM Medical Record Number: [id number] Patient Account Number: [id number] Date of Birth/Sex: Treating RN: [birth year] (77 y.o. [name] Primary Care Provider: Ricki [name] Other Clinician: Referring Provider: Treating Provider/Extender: [name] Weeks in Treatment: 53 Verbal / Phone Orders: No Diagnosis Coding Follow-up Appointments ppointment in 1 week. [name] PA Wednesday ****extra time 60 minutes**** Return A 01/21/23 at 3pm ppointment in 2 weeks. [name] PA Return A Return appointment in 3 weeks. - Please see front desk to schedule appointment Return appointment in 1 month. - Please see front desk to schedule appointment Other: - Please ask your Primary Care  Practioner or Foot Doctor to order orthotic shoes. Anesthetic (In clinic) Topical Lidocaine 5% applied to wound bed Bathing/ Shower/ Hygiene May shower with protection but do not get wound dressing(s) wet. Protect dressing(s) with water repellant cover (for example, large plastic bag) or a cast cover and may then take shower. Edema Control - Lymphedema / SCD / Other Lymphedema Pumps. Use Lymphedema pumps on leg(s) 2-3 times a day for 45-60 minutes. If wearing any wraps or hose, do not remove them. Continue exercising as instructed. - *****pump 3 times a day for an hour each time.***** Elevate legs to the level of the heart or above for 30 minutes daily and/or when sitting for 3-4 times a day  throughout the day. - +++ Important+++ ELEVATE THE LEGS>>>>>>>>>> Avoid standing for long periods of time. Exercise regularly Compression stocking or Garment 30-40 mm/Hg pressure to: - use juxtafits to both legs daily if compression wraps removed Other Edema Control Orders/Instructions: - When sitting please keep your feet up, keep legs at Heart Level or above. Use pillows behind calf to for comfort. Off-Loading Other: - When sitting please keep your feet up, keep legs at Heart Level or above. Use pillows behind calf to for comfort. Fera, [name] ([id number]) [id number] Page 5 of 12 Additional Orders / Instructions Follow Nutritious Diet - Try and increase protein intake. Goal of protein intake is 60g-100g. Wound Treatment Wound #12 - Lower Leg Wound Laterality: Left, Anterior Cleanser: Soap and Water 1 x Per Week/30 Days Discharge Instructions: May shower and wash wound with dial antibacterial soap and water prior to dressing change. Peri-Wound Care: Sween Lotion (Moisturizing lotion) 1 x Per Week/30 Days Discharge Instructions: Apply moisturizing lotion as directed Topical: Keystone antibiotic spray 1 x Per Week/30 Days Prim Dressing: Maxorb Extra Ag+ Alginate Dressing,  4x4.75 (in/in) 1 x Per Week/30 Days ary Discharge Instructions: Apply to wound bed as instructed Secondary Dressing: ABD Pad, 8x10 1 x Per Week/30 Days Discharge Instructions: Apply over primary dressing as directed. Secondary Dressing: OptiLock Super Absorbent, 5x5.5 (in/in) (Generic) 1 x Per Week/30 Days Discharge Instructions: Apply directly to wound bed as directed Compression Wrap: Urgo K2, (equivalent to a 4 layer) two layer compression system, regular 1 x Per Week/30 Days Discharge Instructions: Apply Urgo K2 as directed (alternative to 4 layer compression). Wound #13 - Lower Leg Wound Laterality: Left, Lateral Cleanser: Soap and Water 1 x Per Week/30 Days Discharge Instructions: May shower and wash wound with dial antibacterial soap and water prior to dressing change. Peri-Wound Care: Sween Lotion (Moisturizing lotion) 1 x Per Week/30 Days Discharge Instructions: Apply moisturizing lotion as directed Topical: Keystone antibiotic spray 1 x Per Week/30 Days Prim Dressing: Maxorb Extra Ag+ Alginate Dressing, 4x4.75 (in/in) 1 x Per Week/30 Days ary Discharge Instructions: Apply to wound bed as instructed Secondary Dressing: ABD Pad, 8x10 1 x Per Week/30 Days Discharge Instructions: Apply over primary dressing as directed. Secondary Dressing: OptiLock Super Absorbent, 5x5.5 (in/in) (Generic) 1 x Per Week/30 Days Discharge Instructions: Apply directly to wound bed as directed Compression Wrap: Urgo K2, (equivalent to a 4 layer) two layer compression system, regular 1 x Per Week/30 Days Discharge Instructions: Apply Urgo K2 as directed (alternative to 4 layer compression). Wound #6 - Lower Leg Wound Laterality: Right, Medial Cleanser: Soap and Water 1 x Per Week/30 Days Discharge Instructions: May shower and wash wound with dial antibacterial soap and water prior to dressing change. Peri-Wound Care: Sween Lotion (Moisturizing lotion) 1 x Per Week/30 Days Discharge Instructions: Apply  moisturizing lotion as directed Topical: Keystone antibiotic spray 1 x Per Week/30 Days Prim Dressing: Maxorb Extra Ag+ Alginate Dressing, 4x4.75 (in/in) 1 x Per Week/30 Days ary Discharge Instructions: Apply to wound bed as instructed Secondary Dressing: ABD Pad, 8x10 1 x Per Week/30 Days Discharge Instructions: Apply over primary dressing as directed. Secondary Dressing: OptiLock Super Absorbent, 5x5.5 (in/in) (Generic) 1 x Per Week/30 Days Discharge Instructions: Apply directly to wound bed as directed Compression Wrap: Urgo K2, (equivalent to a 4 layer) two layer compression system, regular 1 x Per Week/30 Days Discharge Instructions: Apply Urgo K2 as directed (alternative to 4 layer compression). Wound #7 - Lower Leg Wound Laterality: Left, Medial Cleanser: Soap and Water 1  x Per Week/30 Days Discharge Instructions: May shower and wash wound with dial antibacterial soap and water prior to dressing change. Peri-Wound Care: Sween Lotion (Moisturizing lotion) 1 x Per Week/30 Days Discharge Instructions: Apply moisturizing lotion as directed Topical: Keystone antibiotic spray 1 x Per Week/30 Days [name], [name] ([id number]) [id number] Page 6 of 12 Prim Dressing: Maxorb Extra Ag+ Alginate Dressing, 4x4.75 (in/in) 1 x Per Week/30 Days ary Discharge Instructions: Apply to wound bed as instructed Secondary Dressing: ABD Pad, 8x10 1 x Per Week/30 Days Discharge Instructions: Apply over primary dressing as directed. Secondary Dressing: OptiLock Super Absorbent, 5x5.5 (in/in) (Generic) 1 x Per Week/30 Days Discharge Instructions: Apply directly to wound bed as directed Compression Wrap: Urgo K2, (equivalent to a 4 layer) two layer compression system, regular 1 x Per Week/30 Days Discharge Instructions: Apply Urgo K2 as directed (alternative to 4 layer compression). Electronic Signature(s) Signed: 01/14/2023 5:08:25 PM By: Baltazar Najjar MD Signed: 01/14/2023 6:19:00  PM By: [name] RN, [name] Entered By: [name] on 01/14/2023 16:00:26 -------------------------------------------------------------------------------- Problem List Details Patient Name: Date of Service: [name], [name] 01/14/2023 3:00 PM Medical Record Number: [id number] Patient Account Number: [id number] Date of Birth/Sex: Treating RN: June 12, 1945 (77 y.o. M) Primary Care Provider: Ricki [name] Other Clinician: Referring Provider: Treating Provider/Extender: [name] Weeks in Treatment: 6 Active Problems ICD-10 Encounter Code Description Active Date MDM Diagnosis I87.333 Chronic venous hypertension (idiopathic) with ulcer and inflammation of 06/11/2022 No Yes bilateral lower extremity I89.0 Lymphedema, not elsewhere classified 03/26/2022 No Yes L97.828 Non-pressure chronic ulcer of other part of left lower leg with other specified 03/26/2022 No Yes severity 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 other part of right lower leg with other specified 03/26/2022 No Yes severity L84 Corns and callosities 10/15/2022 No Yes Inactive Problems Resolved Problems Electronic Signature(s) Signed: 01/14/2023 5:08:25 PM By: Baltazar Najjar MD Entered By: Baltazar Najjar on 01/14/2023 16:08:43 [name], [name] ([id number]) [id number] Page 7 of 12 -------------------------------------------------------------------------------- Progress Note Details Patient Name: Date of Service: [name], [name] 01/14/2023 3:00 PM Medical Record Number: [id number] Patient Account Number: [id number] Date of Birth/Sex: Treating RN: [birth year] (77 y.o. M) Primary Care Provider: Ricki [name] Other Clinician: Referring Provider: Treating Provider/Extender: [name] Weeks in Treatment: 20 Subjective History of Present Illness (HPI) ADMISSION 02/23/2020 Patient is a 77 year old man who lives in [place] who arrives accompanied by  his wife. He has a history of chronic lymphedema and venous insufficiency in his bilateral lower legs which may have something to do that with having a history of DVT as well as being treated for prostate cancer. In any case he recently got compression pumps at home but compliance has been an issue here. He has compression stockings however they are probably not sufficient enough to control swelling. They tell us that things deteriorated for him in late August he was admitted to [facility] for 7 days. This was with cellulitis I think of his bilateral lower legs. Discharge he was noted to have wounds on his bilateral lower legs. He was discharged on Bactrim. They tried to get him home health through [facility] of course they declined him. His wife is been wrapping these applying some form of silver foam dressing. He has a history of wounds before although nothing that would not heal with basic home topical dressings. He has 2 areas on the left medial, left anterior and left lateral and a smaller area on the right medial. All  of these have considerable depth. Past medical history includes iron deficiency anemia, lymphedema followed by the rehab center at [facility] with lymphedema wraps I believe, DVT on chronic anticoagulation, prostate cancer, chronic venous insufficiency, hypertension. As mentioned he has compression pumps but does not use them. ABIs in our clinic were noncompressible bilaterally 10/14; patient with severe bilateral lymphedema right greater than left. He came in with bilateral lower extremity wounds left greater than right. Even though the right side has more of the edema most of the wounds here almost closed on the right medial. He has 3 remaining wounds on the left We have been using silver alginate under 4-layer compression I have been trying to get him to be compliant with his external compression pumps 10/21; patient with 3 small wounds on the left leg and  1 on the right medial in the setting of severe lymphedema and chronic venous insufficiency. We have been using silver alginate under 4-layer compression he is using his external compression pumps twice a day 11/4; ARTERIAL STUDIES on the right show an ABI of 1.02 TBI of 0.858 with biphasic waveforms on the left 0.98 with a TBI of 0.55 and biphasic waveforms. Does not look like he has significant arterial disease. We are treating him for lymphedema he has compression pumps. He has punched-out areas on the left anterior left lateral and right medial lower extremities 11/11; after we obtained his arterial studies I put him in 4 layer compression. He is using his compression pumps probably once a day although I have asked him to do twice. Primary dressing to the wound is silver collagen he has severe lymphedema likely secondary to chronic venous insufficiency. Wounds on the left lateral, left medial and left anterior and a small area on the right medial 12/2; the area on the right anterior lower leg has healed. We initially thought that the area medially had healed as well however when her discharge nurse came in she detected fluid in the wound simply opened up. This is actually worse than I remember this pain. The area on the left lateral potentially slightly smaller He is also complaining about pain in his left hand he says that this is actually been getting some better he has been using topical creams on this. She asked that I look at this 12/9 after last weeks issues we have 2 wounds one on the right medial lower leg and 1 on the left lateral. Both of these are in the same condition. I think because of thickened skin secondary to chronic lymphedema these wounds actually have depth of almost 0.8 cm. 12/16; the patient has 2 small but deep wounds one on the right medial and one on the left lateral. The right medial is actually the worst of these. He arrives in clinic today with absolutely terrible  edema in the right leg apparently his 4-layer wrap fell down to just above his ankle he did not think about this he is apparently been continuing to use his compression pump twice a day. The left leg looks a lot better. 05/09/2020 upon evaluation today patient appears to be doing decently well in regard to his wounds. Everything is measuring smaller the right leg still has a little bit deeper wound in the left seems to be almost completely healed in my opinion I am very pleased in general with how things are progressing. He has a 4- layer compression wrap we have been using endoform today we will probably have to use collagen just  based on the fact that we do not have endoform it is on order. 1/6; the patient's wound on the left lateral lower leg has healed. Still has 1 on the right medial. 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 Using compression pumps at home twice a day. 1/13; left lateral lower leg is still healed. He has a deep punched out rectangular shaped wound on the right medial calf. Looking down at this it appears that he is attempting to epithelialize around the edges of the wound and on the base as well. His edema is reasonably well controlled we have been using collagen with absolutely no effect 1/20; left lateral lower leg remains closed he has extremitease stockings. The area on the right medial calf I aggressively debrided last week measures larger but the surface looks better. We have been using Hydrofera Blue. We ran Oasis through his insurance but we have not seen the results of this 1/27; left lower leg wound with chronic venous insufficiency and secondary lymphedema. I did aggressive debridement on this last week the wound seems to have come in healthy looking surface using Hydrofera Blue. He was denied for Oasis 2/3; small divot in the right medial lower leg. Under illumination the walls of this divot are epithelialized however the base has slough which I  removed with a curette we have been using Hydrofera Blue 2/10 small divot on the right medial lower leg pinpoint illumination at the base of this cone-shaped wound. We have been using Hydrofera Blue but I will switch to calcium alginate this week 2/17; the small divot on the right medial lower leg is fully epithelialized. There is no visible open area under illumination. He has his own stocking for the right leg similar to the one he has been wearing on the left. 03/26/2022; READMISSION This is a now 77 year old man that we had in the clinic from 02/23/2020 through 07/05/2020. At that point he had bilateral lower extremity wounds left greater than right in the setting of severe lymphedema. He had already obtained compression pumps ordered for him I think from the wound care clinic in [facility] [place], [place] ([id number]) [phone].pdf Page 8 of 12 [place] so I do not really have record of what he has been using. He claims to be using them once a day but there is a problem with the sleeve on the left leg. About 2 weeks ago he was hospitalized from 03/11/2022 through 03/14/2022 with diastolic congestive heart failure. 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 He was diuresed. Developed some prerenal azotemia and he has not been taking any diuretics currently. He has not been putting stockings on his legs since he got out of hospital and still has his legs dependent for long periods. Past medical history history of prostate cancer treated with prostatectomy and radiation this was apparently about 8 years ago, history of DVT on chronic Coumadin, history of lymphedema was managed for a while at the clinic in [employer]. History of inguinal hernia repair in September 22, hypertension, stage IIIb chronic renal failure ABIs today were noncompressible on the right 1.12 on the left 04-02-2022 upon evaluation today patient appears to be  doing well currently in regard to his legs I do feel like both areas that are draining are actually much drier than they were in the picture last week although the left is drier than the right. He is tolerating the 4-layer compression wraps at this point he  did contact the pump company and they are actually working on getting him a new compression sleeve for one of his legs which have previously popped and was not functioning properly. 04-23-2022 upon evaluation today patient appears to be doing well currently in regard to his wounds on the legs. I am actually very pleased with where things stand and I do feel like that we are headed in the right direction. Fortunately there is no sign of active infection locally or systemically at this time. 05-07-2022 upon evaluation today patient appears to be doing well currently in regard to his wounds in fact things are showing signs of improvement which is good news I do not see too much that actually appears to be open and I am very pleased in that regard. No fevers, chills, nausea, vomiting, or diarrhea. 05-21-2022 upon evaluation today patient appears to be doing somewhat poorly in regard to drainage of his lower extremities bilaterally. The right is greater than left as far as the weeping area. Nonetheless it seems to be getting worse not better. He actually has pitting edema which is at least 2+ to the thighs and I am concerned about the fact that he is may be fluid overloaded in general and that is the reason why we cannot get this under control. I know he is not using his pumps all the time because he actually told the nurse that he was either going to pump or he was going to use his fluid pills but not do both. For that reason I do think that he needs to be really doing both in order to get the fluid out as effectively as possible obviously with the 4-layer compression wraps were doing as much as we can from a compression standpoint but it is really not  enough. He tells me that he elevates his leg is much as he can in between pumping and other activity throughout the day. 05-28-2022 upon evaluation today patient appears to be doing better in regard to his wounds although the measurements may be a little bit larger this is a very difficult wound to heal it is very indistinct in a lot of areas. Nonetheless there is can be some need for sharp debridement in regard to both medial and lateral legs. Fortunately I see no signs of active infection locally nor systemically at this time. No fevers, chills, nausea, vomiting, or diarrhea. 06-04-2022 upon evaluation today patient appears to be doing poorly in general in regard to the wounds on his legs. He still continues to have a tremendous amount of fluid not just in the lower portion of his leg but to be honest his thigh where he has 2-3+ pitting edema in the thigh as well. Unfortunately I do not know that we will be able to get this healed effectively and keep it healed on the lower extremities unless he gets the overall fluid situation taking and under control. Fortunately I do not see any signs of infection locally nor systemically which is great news. He just seems to be very fluid overloaded. 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 He has been tolerating the dressing changes with the compression wraps without complication. Fortunately I do not see any evidence of infection at this time which is great news. No fevers, chills, nausea, vomiting, or diarrhea. 06-18-2022 upon evaluation today patient appears to be doing well currently in regard to his wounds as far as not  looking like they are terribly infected but nonetheless I am concerned about a subacute infection secondary to the fact that he continues to have spreading despite the compression therapy. We actually did do an Unna boot on him  last week this is actually the first wrap that actually stayed up everything else has been sliding down quite significantly. Fortunately there does not appear to be any signs of infection systemically at this time. With that being said I do believe that locally there seems to be an issue going on here and again I Ernie Hew do a PCR culture to see what that shows also think that I am going to put him on a broad-spectrum antibiotic, doxycycline to see how that will help as well. He does tell me that coming into the clinic today that he was feeling short of breath like "he was about to have a heart attack" because he was having such a hard time breathing. He says that he told this to Dr. Jodelle Green his cardiologist as well when he was evaluated in the past 1 to 2 weeks. 07-02-2022 upon evaluation today patient appears to be doing poorly currently in regard to his wound. He has been tolerating the dressing changes. Unfortunately he has not had any compression wraps on for the past week because he was unable to make it in for his appointment last week. With that being said he has a significant amount of drainage he tells me has been using his pumps but despite this in the pumps he still has been draining quite a bit. The drainage is also somewhat purulent unfortunately. We did attempt to get in touch with his cardiologist last week unfortunately we were unable to get up with him I did advise that the patient needs to get in touch with him upon leaving today in order to make sure they know he is on the new antibiotics I am going to send him this will be Levaquin and Augmentin. 07-09-2022 upon evaluation today patient appears to be doing about the same in regard to his legs he may have just a slight amount of improvement with regard to the drainage probably Keystone topical antibiotics are helping in this regard to some degree. Fortunately there does not appear to be any signs of active infection systemically which is  great news. No fevers, chills, nausea, vomiting, or diarrhea. 07-16-2022 upon evaluation today patient appears to be doing well currently in regard to his wound. He has been tolerating the dressing changes without complication. Fortunately there does not appear to be any signs of active infection locally nor systemically at this time. With that being said he cannot keep the wraps up he tells me on the left side he had to cut this down because it got too tight. He has been using his pumps but he is on the right side the wrap actually straight down causing some pushing around the central part of his leg just below the calf I think this is a bigger risk for him that help at this point. I think that we may need to try something different he should be getting his compression socks shortly he tells me they were ordered last Thursday. 3/6; ; this is a patient who lives in [place]. He has severe bilateral lymphedema. He has compression pumps, we have been using kerlix Ace wrap Keystone. He is changing the dressing. We do not have home health. 08-06-2022 upon evaluation today patient appears to be doing a little better in regard  to his wounds in general at this point. Fortunately there does not appear to be any signs of active infection locally nor systemically at this time which is great news and overall I am extremely pleased with where we stand today. 08-13-2022 upon evaluation today patient appears to actually be doing significantly better compared to last week. He actually did go to the hospital I told him that he needed to when he left here and he actually did go. With that being said they actually ended up admitting him he was having shortness of breath and I thought it might be related to congestive heart failure turns out he actually had a pulmonary embolism. Subsequently they were able to get him off of the Coumadin switching over to Eliquis to get things stabilized in that regard they also had them  wrapped and got his swelling under control on his legs he actually looks much better pretty much across the board at this point. I am very pleased in that regard. With that being said I am very happy that he finally went that could have been a very dangerous situation. 08-20-2022 upon evaluation today patient appears to be doing well currently in regard to his wound. Has been tolerating the dressing changes without complication. Fortunately there does not appear to be any signs of active infection locally nor systemically at this time. I think his legs are doing better there is some need for sharp debridement today. 08-27-2022 upon evaluation patient is actually making excellent progress. I am actually very pleased with where he stands and I think that he is moving in the right direction. In general I think that we are looking pretty good at the moment. 09-03-2022 upon evaluation today patient appears to be doing well currently in regard to his wound. He is actually tolerating dressing changes on the left and [name], [name] ([id number]) [id number] Page 9 of 12 right leg without complication. Fortunately I do not see any need for debridement of the left leg the right leg I think we probably do some need to perform some debridement here. 09-10-2022 upon evaluation today patient's wounds actually showed signs of improvement in both legs I do not see much is going require debridement today which was great news. Fortunately I do not see any evidence of infection which I think is also excellent he seems to be using his pumps and doing everything right I am happy about how this is progressing at this point. 09-17-2022 upon evaluation today patient appears to be doing decently well in regard to his wounds. He has been tolerating the dressing changes without complication. Fortunately there does not appear to be any signs of active infection at this time which is good news. 09-24-2022 upon  evaluation today patient appears to be doing well currently in regard to his wounds. He has been tolerating the dressing changes without complication. Fortunately there does not appear to be any signs of active infection locally nor systemically which is great news. No fevers, chills, nausea, vomiting, or diarrhea. 10-08-2022 upon evaluation today patient appears to be doing excellent currently in regard to his wound. He has been tolerating the dressing changes without complication though it does not sound like he has been using his compression wraps for a bit here. He does think he was doing better with the [facility] topical antibiotics we can definitely go back to using that but I think the biggest issue here is that his swelling is just very out of control and needs to  be under control. I discussed that with him today. 10-15-2022 upon evaluation today patient appears to be doing well currently in regard to his wounds. He is actually making some progress which is good news. Fortunately I do not see any evidence of active infection locally nor systemically which is great news as well. No fevers, chills, nausea, vomiting, or diarrhea. He does have a callused area on the plantar aspect of his left foot which is actually causing some pain and he wonders if I can trim this down for him. 10-22-2022 upon evaluation today patient seems to be making progress. He is actually doing quite well and very pleased in that regard. I do not see any signs of active infection at this time. 11-05-2022 upon evaluation patient appears to be making progress although slowly towards closure. He seems to be doing well with regard to his legs were still using the [facility] topical antibiotics and he seems to be doing quite well. He is going require some sharp debridement today. 11-19-2022 upon evaluation today patient unfortunately appears to be extremely swollen at this point. He tells me that he ran out of supplies he also tells me  his leg started leaking more because of not having supplies he was unable to wear his compression wraps that is the juxta fit compression wraps. Therefore his legs are extremely swollen much larger than normal and do not appear to be doing well at all today. He is going require some debridement I also think he is going require [place] to perform compression wrapping today. 11-26-2022 upon evaluation today patient appears to be doing well currently in regard to his legs as far as infection is concerned I see nothing that appears to be infected. Fortunately I do not see any signs of active infection systemically either which is also good news. With that being said he still is extremely swollen as far as his legs are concerned. I do not see any signs of overall worsening but also do not see any signs of significant improvement which is the major issue here. 12-03-2022 upon evaluation today patient appears to be doing poorly still in regard to his legs the apparently has been taken off the wraps and not leaving the week by week. With that being said he has not had really the dressings to put on the release that is running out and subsequently though he is using his wraps I am not sure that he has been keeping them on like he needs to. I explained to him I really wanted to have the wraps that I put on him left on until he comes back to see me so that we can keep the compression under better control. He voiced understanding today he tells me that he was "confused about that". Nonetheless based on what I am seeing right now also think that he is up on his feet too much I think he needs to get the feet elevated. 7/24; this is a patient with severe bilateral stage III lymphedema. On the right lower leg medially is a large area of macerated denuded skin was too small deeper areas in this. On the left leg he is 2 areas that are a little more standard in terms of wounds. Massive lymphedema bilaterally. He has  compression pumps at home which he uses for 1 hour twice a day 12-17-2022 upon evaluation today patient's legs though a little bit smaller still continue to have significant issues with weeping here this just does not dry. I  really think he needs to be changing this daily and this means we will probably have to try to go back to the Velcro wraps and give this a shot. I think may be going to the Velcro wraps, changing the dressing to a superabsorber like XtraSorb or the like, and have him elevate his legs and do his pumps 3 times per day is probably going to be the way to go to try to see if we can make some improvement here. 12-24-2022 upon evaluation today patient unfortunately is still struggling to get anything under control. We have been extremely aggressive and trying to: Control his swelling everything we do however seems to be met with resistance. He tells me that he is pumping 3 times per day with his lymphedema pumps. He also tells me that he is try to elevate his legs is much as possible and that subsequently he has been keeping his compression wraps on. He goes back and forth between saying that he can keep our wraps on and not but right now I really do not see that he is doing well with the juxta fit is at least not from the standpoint of improving the overall appearance I think it does to some degree help maintain but we are still struggling here. I have voiced a concern to the patient that despite everything you are doing I am still not seeing a lot of improvement here and to be honest I am not sure what is can take get this under control he may have to go into the hospital for admission. 12-31-2022 upon evaluation today patient appears to be doing well currently in regard to his wounds. They seem a little better although he has several pustules around the legs in general but has been concerned about infection. I am actually going to go ahead and see about getting an antibiotic sent into the  pharmacy today although I am going to obtain a PCR culture as well. 01-07-2023 upon evaluation today patient appears to be doing well currently in regard to his wound. Has been tolerating the dressing changes without complication. He actually seems to be doing much better. He did have a PCR culture which showed Serratia as well and the prominent organisms here. Levaquin was actually a good option to treat the Serratia along with the other organisms that were problematic including E. coli. Nonetheless I think coupled with the topical Keystone antibiotics and the alginate he is really doing quite well today. 8/28; patient with severe bilateral lymphedema he has wounds bilaterally on his lower legs but they look a lot better today. We are using silver alginate and Keystone with Urgo K2 lite wraps. He comes in today with his wounds looking quite good. He was prescribed Levaquin last time I think he will complete this in a few days. Objective Constitutional Patient is hypertensive.. Pulse regular and within target range for patient.Marland Kitchen Respirations regular, non-labored and within target range.. Temperature is normal and within the target range for the patient.Marland Kitchen Appears in no distress. Vitals Time Taken: 3:57 PM, Height: 74 in, Weight: 250 lbs, BMI: 32.1, Temperature: 98.1 F, Pulse: 79 bpm, Respiratory Rate: 18 breaths/min, Blood Pressure: 184/80 mmHg. [name], [name] ([id number]) [id number] Page 10 of 12 General Notes: Wound exam; all of his wounds appear to be more superficial. There is still some debris on the surface but in review of him of this and the amount of edema in his legs I did not debride this. There is no evidence  of surrounding infection. From talking with our intake nurse it seems like these are filling in. No evidence of concurrent infection Integumentary (Hair, Skin) Wound #11 status is Healed - Epithelialized. Original cause of wound was Gradually Appeared.  The date acquired was: 10/19/2022. The wound has been in treatment 12 weeks. The wound is located on the Left,Posterior Lower Leg. The wound measures 0cm length x 0cm width x 0cm depth; 0cm^2 area and 0cm^3 volume. There is a medium amount of serous drainage noted. The wound margin is distinct with the outline attached to the wound base. There is small (1-33%) red granulation within the wound bed. There is a large (67-100%) amount of necrotic tissue within the wound bed. The periwound skin appearance had no abnormalities noted for texture. The periwound skin appearance had no abnormalities noted for color. The periwound skin appearance exhibited: Dry/Scaly. The periwound skin appearance did not exhibit: Maceration. Periwound temperature was noted as No Abnormality. Wound #12 status is Open. Original cause of wound was Frostbite. The date acquired was: 12/17/2022. The wound has been in treatment 4 weeks. The wound is located on the Left,Anterior Lower Leg. The wound measures 1cm length x 0.4cm width x 0.1cm depth; 0.314cm^2 area and 0.031cm^3 volume. There is Fat Layer (Subcutaneous Tissue) exposed. There is no tunneling or undermining noted. There is a large amount of serous drainage noted. The wound margin is distinct with the outline attached to the wound base. There is small (1-33%) red granulation within the wound bed. There is a large (67-100%) amount of necrotic tissue within the wound bed including Adherent Slough. The periwound skin appearance exhibited: Hemosiderin Staining. The periwound skin appearance did not exhibit: Callus, Crepitus, Excoriation, Induration, Rash, Scarring, Dry/Scaly, Maceration, Atrophie Blanche, Cyanosis, Ecchymosis, Mottled, Pallor, Rubor, Erythema. Periwound temperature was noted as No Abnormality. Wound #13 status is Open. Original cause of wound was Gradually Appeared. The date acquired was: 12/24/2022. The wound has been in treatment 3 weeks. The wound is located on the  Left,Lateral Lower Leg. The wound measures 0.5cm length x 1cm width x 0.1cm depth; 0.393cm^2 area and 0.039cm^3 volume. There is Fat Layer (Subcutaneous Tissue) exposed. There is no tunneling or undermining noted. There is a medium amount of serous drainage noted. The wound margin is flat and intact. There is large (67-100%) pink granulation within the wound bed. There is a small (1-33%) amount of necrotic tissue within the wound bed including Adherent Slough. The periwound skin appearance had no abnormalities noted for texture. The periwound skin appearance had no abnormalities noted for moisture. The periwound skin appearance exhibited: Hemosiderin Staining. Periwound temperature was noted as No Abnormality. Wound #6 status is Open. Original cause of wound was Gradually Appeared. The date acquired was: 03/05/2022. The wound has been in treatment 42 weeks. The wound is located on the Right,Medial Lower Leg. The wound measures 7.5cm length x 4.5cm width x 0.4cm depth; 26.507cm^2 area and 10.603cm^3 volume. There is Fat Layer (Subcutaneous Tissue) exposed. There is no tunneling or undermining noted. There is a large amount of serous drainage noted. The wound margin is distinct with the outline attached to the wound base. There is small (1-33%) pink granulation within the wound bed. There is a large (67-100%) amount of necrotic tissue within the wound bed including Adherent Slough. The periwound skin appearance exhibited: Scarring, Hemosiderin Staining. The periwound skin appearance did not exhibit: Callus, Crepitus, Excoriation, Induration, Rash, Dry/Scaly, Maceration, Atrophie Blanche, Cyanosis, Ecchymosis, Mottled, Pallor, Rubor, Erythema. Periwound temperature was noted as No  Abnormality. Wound #7 status is Open. Original cause of wound was Gradually Appeared. The date acquired was: 03/05/2022. The wound has been in treatment 42 weeks. The wound is located on the Left,Medial Lower Leg. The wound  measures 2.7cm length x 2cm width x 0.2cm depth; 4.241cm^2 area and 0.848cm^3 volume. There is Fat Layer (Subcutaneous Tissue) exposed. There is no tunneling or undermining noted. There is a large amount of serous drainage noted. The wound margin is distinct with the outline attached to the wound base. There is large (67-100%) pink granulation within the wound bed. There is a small (1-33%) amount of necrotic tissue within the wound bed including Adherent Slough. The periwound skin appearance exhibited: Scarring, Hemosiderin Staining. The periwound skin appearance did not exhibit: Callus, Crepitus, Excoriation, Induration, Rash, Dry/Scaly, Maceration, Atrophie Blanche, Cyanosis, Ecchymosis, Mottled, Pallor, Rubor, Erythema. Periwound temperature was noted as No Abnormality. 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 There was a Double Layer Compression Therapy Procedure by [name], RN. Post procedure Diagnosis Wound #12: Same as Pre-Procedure Wound #13 Pre-procedure diagnosis of Wound #13 is a Lymphedema located on the Left,Lateral Lower Leg . There was a Double Layer Compression Therapy Procedure by [name], RN. Post procedure Diagnosis Wound #13: Same as Pre-Procedure Wound #6 Pre-procedure diagnosis of Wound #6 is a Lymphedema located on the Right,Medial Lower Leg . There was a Double Layer Compression Therapy Procedure by [name], RN. Post procedure Diagnosis Wound #6: Same as Pre-Procedure Wound #7 Pre-procedure diagnosis of Wound #7  is a Lymphedema located on the Left,Medial Lower Leg . There was a Double Layer Compression Therapy Procedure by [name], RN. Post procedure Diagnosis Wound #7: Same as Pre-Procedure [name], [name] ([id number]) [id number] Page 11 of 12 Plan Follow-up Appointments: Return Appointment in 1 week. [name] PA Wednesday ****extra time 60 minutes**** 01/21/23 at 3pm Return Appointment in 2 weeks. [name] PA Return appointment in 3 weeks. - Please see front desk to schedule appointment Return appointment in 1 month. - Please see front desk to schedule appointment Other: - Please ask your Primary Care Practioner or Foot Doctor to order orthotic shoes. Anesthetic: (In clinic) Topical Lidocaine 5% applied to wound bed Bathing/ Shower/ Hygiene: May shower with protection but do not get wound dressing(s) wet. Protect dressing(s) with water repellant cover (for example, large plastic bag) or a cast cover and may then take shower. Edema Control - Lymphedema / SCD / Other: Lymphedema Pumps. Use Lymphedema pumps on leg(s) 2-3 times a day for 45-60 minutes. If wearing any wraps or hose, do not remove them. Continue exercising as instructed. - *****pump 3 times a day for an hour each time.***** Elevate legs to the level of the heart or above for 30 minutes daily and/or when sitting for 3-4 times a day throughout the day. - +++ Important+++ ELEVATE THE LEGS>>>>>>>>>> Avoid standing for long periods of time. Exercise regularly Compression stocking or Garment 30-40 mm/Hg pressure to: - use juxtafits to both legs daily if compression wraps removed Other Edema Control Orders/Instructions: - When sitting please keep your feet up, keep legs at Heart Level or above. Use  pillows behind calf to for comfort. Off-Loading: Other: - When sitting please keep your feet up, keep legs at Heart Level or above. Use pillows behind calf to for comfort. Additional Orders /  Instructions: Follow Nutritious Diet - Try and increase protein intake. Goal of protein intake is 60g-100g. WOUND #12: - Lower Leg Wound Laterality: Left, Anterior Cleanser: Soap and Water 1 x Per Week/30 Days Discharge Instructions: May shower and wash wound with dial antibacterial soap and water prior to dressing change. Peri-Wound Care: Sween Lotion (Moisturizing lotion) 1 x Per Week/30 Days Discharge Instructions: Apply moisturizing lotion as directed Topical: Keystone antibiotic spray 1 x Per Week/30 Days Prim Dressing: Maxorb Extra Ag+ Alginate Dressing, 4x4.75 (in/in) 1 x Per Week/30 Days ary Discharge Instructions: Apply to wound bed as instructed Secondary Dressing: ABD Pad, 8x10 1 x Per Week/30 Days Discharge Instructions: Apply over primary dressing as directed. Secondary Dressing: OptiLock Super Absorbent, 5x5.5 (in/in) (Generic) 1 x Per Week/30 Days Discharge Instructions: Apply directly to wound bed as directed Com pression Wrap: Urgo K2, (equivalent to a 4 layer) two layer compression system, regular 1 x Per Week/30 Days Discharge Instructions: Apply Urgo K2 as directed (alternative to 4 layer compression). WOUND #13: - Lower Leg Wound Laterality: Left, Lateral Cleanser: Soap and Water 1 x Per Week/30 Days Discharge Instructions: May shower and wash wound with dial antibacterial soap and water prior to dressing change. Peri-Wound Care: Sween Lotion (Moisturizing lotion) 1 x Per Week/30 Days Discharge Instructions: Apply moisturizing lotion as directed Topical: Keystone antibiotic spray 1 x Per Week/30 Days Prim Dressing: Maxorb Extra Ag+ Alginate Dressing, 4x4.75 (in/in) 1 x Per Week/30 Days ary Discharge Instructions: Apply to wound bed as instructed Secondary Dressing: ABD Pad, 8x10 1 x Per Week/30 Days Discharge Instructions: Apply over primary dressing as directed. Secondary Dressing: OptiLock Super Absorbent, 5x5.5 (in/in) (Generic) 1 x Per Week/30 Days Discharge  Instructions: Apply directly to wound bed as directed Com pression Wrap: Urgo K2, (equivalent to a 4 layer) two layer compression system, regular 1 x Per Week/30 Days Discharge Instructions: Apply Urgo K2 as directed (alternative to 4 layer compression). WOUND #6: - Lower Leg Wound Laterality: Right, Medial Cleanser: Soap and Water 1 x Per Week/30 Days Discharge Instructions: May shower and wash wound with dial antibacterial soap and water prior to dressing change. Peri-Wound Care: Sween Lotion (Moisturizing lotion) 1 x Per Week/30 Days Discharge Instructions: Apply moisturizing lotion as directed Topical: Keystone antibiotic spray 1 x Per Week/30 Days Prim Dressing: Maxorb Extra Ag+ Alginate Dressing, 4x4.75 (in/in) 1 x Per Week/30 Days ary Discharge Instructions: Apply to wound bed as instructed Secondary Dressing: ABD Pad, 8x10 1 x Per Week/30 Days Discharge Instructions: Apply over primary dressing as directed. Secondary Dressing: OptiLock Super Absorbent, 5x5.5 (in/in) (Generic) 1 x Per Week/30 Days Discharge Instructions: Apply directly to wound bed as directed Com pression Wrap: Urgo K2, (equivalent to a 4 layer) two layer compression system, regular 1 x Per Week/30 Days Discharge Instructions: Apply Urgo K2 as directed (alternative to 4 layer compression). WOUND #7: - Lower Leg Wound Laterality: Left, Medial Cleanser: Soap and Water 1 x Per Week/30 Days Discharge Instructions: May shower and wash wound with dial antibacterial soap and water prior to dressing change. Peri-Wound Care: Sween Lotion (Moisturizing lotion) 1 x Per Week/30 Days Discharge Instructions: Apply moisturizing lotion as directed Topical: Keystone antibiotic spray 1 x Per Week/30 Days Prim Dressing: Maxorb Extra Ag+ Alginate Dressing, 4x4.75 (in/in) 1 x Per Week/30 Days ary Discharge  Instructions: Apply to wound bed as instructed Secondary Dressing: ABD Pad, 8x10 1 x Per Week/30 Days Discharge Instructions:  Apply over primary dressing as directed. Secondary Dressing: OptiLock Super Absorbent, 5x5.5 (in/in) (Generic) 1 x Per Week/30 Days Discharge Instructions: Apply directly to wound bed as directed Com pression Wrap: Urgo K2, (equivalent to a 4 layer) two layer compression system, regular 1 x Per Week/30 Days Discharge Instructions: Apply Urgo K2 as directed (alternative to 4 layer compression). [name], [name] ([id number]) [id number] Page 12 of 12 1. I did not change the primary dressing which is Keystone antibiotics silver alginate and bilateral Urgo K2 lite's. He has compression pumps at home which she is using reliably. Electronic Signature(s) Signed: 01/14/2023 5:08:25 PM By: Baltazar Najjar MD Entered By: Baltazar Najjar on 01/14/2023 16:13:57 -------------------------------------------------------------------------------- SuperBill Details Patient Name: Date of Service: [name], [name] 01/14/2023 Medical Record Number: [id number] Patient Account Number: [id number] Date of Birth/Sex: Treating RN: 1946-02-29 (77 y.o. 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 Modifier: Quantity: 1 Physician Procedures : CPT4 Code Description Modifier [id number]  [id number] - WC PHYS LEVEL 3 - EST PT ICD-10 Diagnosis Description 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 other part of right lower leg with other specified severity L97.828 Non-pressure chronic ulcer of other part of  left lower leg with other specified severity I87.333 Chronic venous hypertension (idiopathic) with ulcer and inflammation of bilateral lower extremity I89.0 Lymphedema, not elsewhere classified Quantity: 1 Electronic Signature(s) Signed: 01/14/2023 5:08:25 PM By: Baltazar Najjar MD Entered By: Baltazar Najjar on 01/14/2023 16:14:21

## 2023-01-15 NOTE — Progress Notes (Signed)
[name] ([id number]) [id number] Page 1 of 17 Visit Report for 01/14/2023 Arrival Information Details Patient Name: Date of Service: [name], [name] 01/14/2023 3:00 PM Medical Record Number: [id number] Patient Account Number: [id number] Date of Birth/Sex: Treating RN: Jul 31, 1945 (77 y.o. M) Primary Care Eliav Mechling: [name] Other Clinician: Referring Kaylub Detienne: Treating Xion Debruyne/Extender: Stark Klein in Treatment: 8 Visit Information History Since Last Visit All ordered tests and consults were completed: No Patient Arrived: [name] Added or deleted any medications: No Arrival Time: 15:57 Any new allergies or adverse reactions: No Accompanied By: Self Had a fall or experienced change in No Transfer Assistance: None activities of daily living that may affect Patient Requires Transmission-Based Precautions: No risk of falls: Patient Has Alerts: Yes Signs or symptoms of abuse/neglect since last visito No Patient Alerts: Patient on Blood Thinner Hospitalized since last visit: No Right ABI in clinic [place] Implantable device outside of the clinic excluding No cellular tissue based products placed in the center since last visit: Has Dressing in Place as Prescribed: Yes Pain Present Now: No Electronic Signature(s) Signed: 01/14/2023 4:17:26 PM By: Karl Ito Entered By: Karl Ito on 01/14/2023 15:57:19 -------------------------------------------------------------------------------- Compression Therapy Details Patient Name: Date of Service: [name], DA [name] 01/14/2023 3:00 PM Medical Record Number: [id number] Patient Account Number: [id number] Date of Birth/Sex: Treating RN: 1945-10-06 (77 y.o. [name] Primary Care Kerem [name]: [name] Other Clinician: Referring Fritzi Scripter: Treating Luie Laneve/Extender: Valentina Gu Weeks in Treatment: 42 Compression Therapy Performed for Wound  Assessment: Wound #12 Left,Anterior Lower Leg Performed By: Clinician [name], RN Compression Type: Double Layer Post Procedure Diagnosis Same as Pre-procedure Electronic Signature(s) Signed: 01/14/2023 6:19:00 PM By: [name] RN, [name] Entered By: [name] on 01/14/2023 15:59:45 Bernard, Dannielle [name] ([id number]) [id number] Page 2 of 17 -------------------------------------------------------------------------------- Compression Therapy Details Patient Name: Date of Service: [name], DA [name] 01/14/2023 3:00 PM Medical Record Number: [id number] Patient Account Number: [id number] Date of Birth/Sex: Treating RN: 01/21/1946 (77 y.o. [name] Primary Care Merita Hawks: [name] Other Clinician: Referring Chanya Chrisley: Treating Danyl Deems/Extender: Valentina Gu Weeks in Treatment: 42 Compression Therapy Performed for Wound Assessment: Wound #7 Left,Medial Lower Leg Performed By: Clinician [name], RN Compression Type: Double Layer Post Procedure Diagnosis Same as Pre-procedure Electronic Signature(s) Signed: 01/14/2023 6:19:00 PM By: [name] RN, [name] Entered By: [name] on 01/14/2023 15:59:45 -------------------------------------------------------------------------------- Compression Therapy Details Patient Name: Date of Service: [name] 01/14/2023 3:00 PM Medical Record Number: [id number] Patient Account Number: [id number] Date of Birth/Sex: Treating RN: 07-Mar-1946 (77 y.o. [name] Primary Care [name]: [name] Other Clinician: Referring Alianys Chacko: Treating Miyu Fenderson/Extender: Valentina Gu Weeks in Treatment: 42 Compression Therapy Performed for Wound Assessment: Wound #6 Right,Medial Lower Leg Performed By: Clinician [name], RN Compression Type: Double Layer Post Procedure Diagnosis Same as Pre-procedure Electronic Signature(s) Signed: 01/14/2023 6:19:00 PM  By: [name] RN, [name] Entered By: [name] on 01/14/2023 15:59:45 -------------------------------------------------------------------------------- Compression Therapy Details Patient Name: Date of Service: [name] 01/14/2023 3:00 PM Medical Record Number: [id number] Patient Account Number: [id number] Date of Birth/Sex: Treating RN: 11-Dec-1945 (77 y.o. [name] Primary Care Leyton Brownlee: [name] Other Clinician: Referring Juda Lajeunesse: Treating Tess Potts/Extender: Valentina Gu Weeks in Treatment: 59 Compression Therapy Performed for Wound Assessment: Wound #13 Left,Lateral Lower Leg Performed By: Clinician [name], RN Compression Type: Double Layer Post Procedure Diagnosis Same as Pre-procedure Electronic Signature(s) Signed: 01/14/2023 6:19:00 PM By:  [name] RN, [name] Docken, Dannielle [name] ([id number]) PM By: [name] RN, [name] [phone].pdf Page 3 of 17 Signed: 01/14/2023 6:19:00 Entered By: [name] on 01/14/2023 15:59:45 -------------------------------------------------------------------------------- Encounter Discharge Information Details Patient Name: Date of Service: [name] 01/14/2023 3:00 PM Medical Record Number: [id number] Patient Account Number: [id number] Date of Birth/Sex: Treating RN: 1945/08/18 (77 y.o. [name] Primary Care Luke Rigsbee: [name] Other Clinician: Referring Caspian Deleonardis: Treating Tyrae Alcoser/Extender: Stark Klein in Treatment: 27 Encounter Discharge Information Items Discharge Condition: Stable Ambulatory Status: Cane Discharge Destination: Home Transportation: Private Auto Accompanied By: self Schedule Follow-up Appointment: Yes Clinical Summary of Care: Electronic Signature(s) Signed: 01/14/2023 6:19:00 PM By: [name] RN, [name] Entered By: [name] on 01/14/2023  16:02:24 -------------------------------------------------------------------------------- Lower Extremity Assessment Details Patient Name: Date of Service: [name] 01/14/2023 3:00 PM Medical Record Number: [id number] Patient Account Number: [id number] Date of Birth/Sex: Treating RN: [birth year] (77 y.o. [name], [name] Primary Care Hussien Greenblatt: [name] Other Clinician: Referring Janeese Mcgloin: Treating Ciel Chervenak/Extender: Valentina Gu Weeks in Treatment: 42 Edema Assessment Assessed: Kyra Searles: Yes] Franne Forts: Yes] Edema: [Left: Yes] [Right: Yes] Calf Left: Right: Point of Measurement: 40 cm From Medial Instep 55.5 cm 64 cm Ankle Left: Right: Point of Measurement: 13 cm From Medial Instep 38 cm 39 cm Vascular Assessment Pulses: Dorsalis Pedis Palpable: [Left:Yes] [Right:Yes] Extremity colors, hair growth, and conditions: Extremity Color: [Left:Hyperpigmented] [Right:Hyperpigmented] Hair Growth on Extremity: [Left:No] Temperature of Extremity: [Left:Warm] [Right:Warm] Capillary Refill: [Left:> 3 seconds] [Right:> 3 seconds] Dependent Rubor: [Left:No] [Right:No] Melin, Esdras ([id number]) [Left:Yes] [Right:[id number] Page 4 of 17 Yes] Toe Nail Assessment Left: Right: Thick: Yes Yes Discolored: Yes Yes Deformed: Yes Yes Improper Length and Hygiene: Yes Yes Electronic Signature(s) Signed: 01/14/2023 6:19:00 PM By: [name] RN, [name] Entered By: [name] on 01/14/2023 15:57:37 -------------------------------------------------------------------------------- Multi Wound Chart Details Patient Name: Date of Service: Mccrystal, DA [name] 01/14/2023 3:00 PM Medical Record Number: [id number] Patient Account Number: [id number] Date of Birth/Sex: Treating RN: May 11, 1946 (77 y.o. M) Primary Care Norberto Wishon: [name] Other Clinician: Referring Vaudie Engebretsen: Treating Jarious Lyon/Extender: Valentina Gu Weeks in  Treatment: 67 Vital Signs Height(in): 74 Pulse(bpm): 79 Weight(lbs): 250 Blood Pressure(mmHg): 184/80 Body Mass Index(BMI): 32.1 Temperature(F): 98.1 Respiratory Rate(breaths/min): 18 [11:Photos:] Left, Posterior Lower Leg Left, Anterior Lower Leg Left, Lateral Lower Leg Wound Location: Gradually Appeared Frostbite Gradually Appeared Wounding Event: Lymphedema Lymphedema Lymphedema Primary Etiology: Anemia, Lymphedema, Deep Vein Anemia, Lymphedema, Deep Vein Anemia, Lymphedema, Deep Vein Comorbid History: Thrombosis, Hypertension, Received Thrombosis, Hypertension, Received Thrombosis, Hypertension, Received Radiation Radiation Radiation 10/19/2022 12/17/2022 12/24/2022 Date Acquired: 12 4 3  Weeks of Treatment: Healed - Epithelialized Open Open Wound Status: No No No Wound Recurrence: No No No Clustered Wound: N/A N/A N/A Clustered Quantity: 0x0x0 1x0.4x0.1 0.5x1x0.1 Measurements L x W x D (cm) 0 0.314 0.393 A (cm) : rea 0 0.031 0.039 Volume (cm) : 100.00% 91.10% 67.50% % Reduction in Area: 100.00% 95.60% 67.80% % Reduction in Volume: Full Thickness Without Exposed Full Thickness Without Exposed Full Thickness Without Exposed Classification: Support Structures Support Structures Support Structures Medium Large Medium Exudate Amount: Serous Serous Serous Exudate Type: Psychologist, forensic Exudate Color: Distinct, outline attached Distinct, outline attached Flat and Intact Wound Margin: Small (1-33%) Small (1-33%) Large (67-100%) Granulation Amount: Red Red Pink Granulation Quality: Large (67-100%) Large (67-100%) Small (1-33%) Necrotic Amount: Fascia: No Fat Layer (Subcutaneous Tissue): Yes Fat Layer (Subcutaneous Tissue): Yes Exposed Structures: Fat Layer (Subcutaneous Tissue): No Fascia:  No Fascia: No Tendon: No Tendon: No Tendon: No Manke, Arber ([id number]) [id number] Page 5 of 17 Muscle: No Muscle: No Muscle: No Joint:  No Joint: No Joint: No Bone: No Bone: No Bone: No Large (67-100%) Medium (34-66%) Medium (34-66%) Epithelialization: Excoriation: No Excoriation: No No Abnormalities Noted Periwound Skin Texture: Induration: No Induration: No Callus: No Callus: No Crepitus: No Crepitus: No Rash: No Rash: No Scarring: No Scarring: No Dry/Scaly: Yes Maceration: No No Abnormalities Noted Periwound Skin Moisture: Maceration: No Dry/Scaly: No Atrophie Blanche: No Hemosiderin Staining: Yes Hemosiderin Staining: Yes Periwound Skin Color: Cyanosis: No Atrophie Blanche: No Ecchymosis: No Cyanosis: No Erythema: No Ecchymosis: No Hemosiderin Staining: No Erythema: No Mottled: No Mottled: No Pallor: No Pallor: No Rubor: No Rubor: No No Abnormality No Abnormality No Abnormality Temperature: N/A Compression Therapy Compression Therapy Procedures Performed: Wound Number: 6 7 N/A Photos: No Photos N/A Right, Medial Lower Leg Left, Medial Lower Leg N/A Wound Location: Gradually Appeared Gradually Appeared N/A Wounding Event: Lymphedema Lymphedema N/A Primary Etiology: Anemia, Lymphedema, Deep Vein Anemia, Lymphedema, Deep Vein N/A Comorbid History: Thrombosis, Hypertension, Received Thrombosis, Hypertension, Received Radiation Radiation 03/05/2022 03/05/2022 N/A Date Acquired: 6 42 N/A Weeks of Treatment: Open Open N/A Wound Status: No No N/A Wound Recurrence: Yes Yes N/A Clustered Wound: 1 1 N/A Clustered Quantity: 7.5x4.5x0.4 2.7x2x0.2 N/A Measurements L x W x D (cm) 26.507 4.241 N/A A (cm) : rea 10.603 0.848 N/A Volume (cm) : 81.50% 91.00% N/A % Reduction in Area: 25.80% 82.00% N/A % Reduction in Volume: Full Thickness Without Exposed Full Thickness Without Exposed N/A Classification: Support Structures Support Structures Large Large N/A Exudate Amount: Serous Serous N/A Exudate Type: Media planner N/A Exudate Color: Distinct, outline attached Distinct,  outline attached N/A Wound Margin: Small (1-33%) Large (67-100%) N/A Granulation Amount: Pink Pink N/A Granulation Quality: Large (67-100%) Small (1-33%) N/A Necrotic Amount: Fat Layer (Subcutaneous Tissue): Yes Fat Layer (Subcutaneous Tissue): Yes N/A Exposed Structures: Fascia: No Fascia: No Tendon: No Tendon: No Muscle: No Muscle: No Joint: No Joint: No Bone: No Bone: No Small (1-33%) Small (1-33%) N/A Epithelialization: Scarring: Yes Scarring: Yes N/A Periwound Skin Texture: Excoriation: No Excoriation: No Induration: No Induration: No Callus: No Callus: No Crepitus: No Crepitus: No Rash: No Rash: No Maceration: No Maceration: No N/A Periwound Skin Moisture: Dry/Scaly: No Dry/Scaly: No Hemosiderin Staining: Yes Hemosiderin Staining: Yes N/A Periwound Skin Color: Atrophie Blanche: No Atrophie Blanche: No Cyanosis: No Cyanosis: No Ecchymosis: No Ecchymosis: No Erythema: No Erythema: No Mottled: No Mottled: No Pallor: No Pallor: No Rubor: No Rubor: No No Abnormality No Abnormality N/A Temperature: Compression Therapy Compression Therapy N/A Procedures Performed: Durflinger, Dannielle [name] ([id number]) [id number] Page 6 of 17 Treatment Notes Wound #11 (Lower Leg) Wound Laterality: Left, Posterior Cleanser Peri-Wound Care Topical Primary Dressing Secondary Dressing Secured With Compression Wrap Compression Stockings Add-Ons Wound #12 (Lower Leg) Wound Laterality: Left, Anterior Cleanser Soap and Water Discharge Instruction: May shower and wash wound with dial antibacterial soap and water prior to dressing change. Peri-Wound Care Sween Lotion (Moisturizing lotion) Discharge Instruction: Apply moisturizing lotion as directed Topical Keystone antibiotic spray Primary Dressing Maxorb Extra Ag+ Alginate Dressing, 4x4.75 (in/in) Discharge Instruction: Apply to wound bed as instructed Secondary Dressing ABD Pad,  8x10 Discharge Instruction: Apply over primary dressing as directed. OptiLock Super Absorbent, 5x5.5 (in/in) Discharge Instruction: Apply directly to wound bed as directed Secured With Compression Wrap Urgo K2, (equivalent to a 4 layer) two layer compression system, regular Discharge Instruction: Apply Urgo K2 as directed (alternative  to 4 layer compression). Compression Stockings Add-Ons Wound #13 (Lower Leg) Wound Laterality: Left, Lateral Cleanser Soap and Water Discharge Instruction: May shower and wash wound with dial antibacterial soap and water prior to dressing change. Peri-Wound Care Sween Lotion (Moisturizing lotion) Discharge Instruction: Apply moisturizing lotion as directed Topical Keystone antibiotic spray Primary Dressing Maxorb Extra Ag+ Alginate Dressing, 4x4.75 (in/in) Discharge Instruction: Apply to wound bed as instructed Secondary Dressing ABD Pad, 8x10 Discharge Instruction: Apply over primary dressing as directed. OptiLock Super Absorbent, 5x5.5 (in/in) Discharge Instruction: Apply directly to wound bed as directed Card, Wynton ([id number]) [id number] Page 7 of 17 Secured With Compression Wrap Urgo K2, (equivalent to a 4 layer) two layer compression system, regular Discharge Instruction: Apply Urgo K2 as directed (alternative to 4 layer compression). Compression Stockings Add-Ons Wound #6 (Lower Leg) Wound Laterality: Right, Medial Cleanser Soap and Water Discharge Instruction: May shower and wash wound with dial antibacterial soap and water prior to dressing change. Peri-Wound Care Sween Lotion (Moisturizing lotion) Discharge Instruction: Apply moisturizing lotion as directed Topical Keystone antibiotic spray Primary Dressing Maxorb Extra Ag+ Alginate Dressing, 4x4.75 (in/in) Discharge Instruction: Apply to wound bed as instructed Secondary Dressing ABD Pad, 8x10 Discharge Instruction: Apply over primary dressing as  directed. OptiLock Super Absorbent, 5x5.5 (in/in) Discharge Instruction: Apply directly to wound bed as directed Secured With Compression Wrap Urgo K2, (equivalent to a 4 layer) two layer compression system, regular Discharge Instruction: Apply Urgo K2 as directed (alternative to 4 layer compression). Compression Stockings Add-Ons Wound #7 (Lower Leg) Wound Laterality: Left, Medial Cleanser Soap and Water Discharge Instruction: May shower and wash wound with dial antibacterial soap and water prior to dressing change. Peri-Wound Care Sween Lotion (Moisturizing lotion) Discharge Instruction: Apply moisturizing lotion as directed Topical Keystone antibiotic spray Primary Dressing Maxorb Extra Ag+ Alginate Dressing, 4x4.75 (in/in) Discharge Instruction: Apply to wound bed as instructed Secondary Dressing ABD Pad, 8x10 Discharge Instruction: Apply over primary dressing as directed. OptiLock Super Absorbent, 5x5.5 (in/in) Discharge Instruction: Apply directly to wound bed as directed Secured With Compression Wrap Urgo K2, (equivalent to a 4 layer) two layer compression system, regular Discharge Instruction: Apply Urgo K2 as directed (alternative to 4 layer compression). Compression Stockings Add-Ons Sorenson, Dannielle [name] ([id number]) [id number] Page 8 of 17 Electronic Signature(s) Signed: 01/14/2023 5:08:25 PM By: Baltazar Najjar MD Entered By: Baltazar Najjar on 01/14/2023 16:09:17 -------------------------------------------------------------------------------- Multi-Disciplinary Care Plan Details Patient Name: Date of Service: Paule, DA [name] 01/14/2023 3:00 PM Medical Record Number: [id number] Patient Account Number: [id number] Date of Birth/Sex: Treating RN: February 17, 1946 (77 y.o. [name] Primary Care Jolicia Delira: [name] Other Clinician: Referring Raquan Iannone: Treating Kyannah Climer/Extender: Valentina Gu Weeks in Treatment:  18 Multidisciplinary Care Plan reviewed with physician Active Inactive Wound/Skin Impairment Nursing Diagnoses: Impaired tissue integrity Knowledge deficit related to ulceration/compromised skin integrity Goals: Patient will have a decrease in wound volume by X% from date: (specify in notes) Date Initiated: 03/26/2022 Target Resolution Date: 04/17/2025 Goal Status: Active Patient/caregiver will verbalize understanding of skin care regimen Date Initiated: 03/26/2022 Target Resolution Date: 04/17/2025 Goal Status: Active Ulcer/skin breakdown will have a volume reduction of 30% by week 4 Date Initiated: 03/26/2022 Date Inactivated: 05/21/2022 Target Resolution Date: 05/17/2022 Unmet Reason: see wound Goal Status: Unmet measurement. Ulcer/skin breakdown will have a volume reduction of 50% by week 8 Date Initiated: 03/26/2022 Date Inactivated: 05/21/2022 Target Resolution Date: 05/17/2022 Unmet Reason: see wound Goal Status: Unmet measurement. Interventions: Assess patient/caregiver ability to obtain necessary supplies Assess patient/caregiver ability to perform  ulcer/skin care regimen upon admission and as needed Assess ulceration(s) every visit Notes: Patient stated today, "I will take my fluid pill or pump not do both." Burleigh Brockmann made aware. Electronic Signature(s) Signed: 01/14/2023 6:19:00 PM By: [name] RN, [name] Entered By: [name] on 01/14/2023 16:00:59 -------------------------------------------------------------------------------- Pain Assessment Details Patient Name: Date of Service: Zuccaro, DA [name] 01/14/2023 3:00 PM Medical Record Number: [id number] Patient Account Number: [id number] Date of Birth/Sex: Treating RN: [birth year] (77 y.o. M) Primary Care Tou Hayner: [name] Other Clinician: Seitzinger, Dannielle [name] ([id number]) [id number] Page 9 of 17 Referring Ellieana Dolecki: Treating Kaizlee Carlino/Extender: Dominga Ferry, [name] Weeks in  Treatment: 42 Active Problems Location of Pain Severity and Description of Pain Patient Has Paino No Site Locations Pain Management and Medication Current Pain Management: Electronic Signature(s) Signed: 01/14/2023 4:17:26 PM By: Karl Ito Entered By: Karl Ito on 01/14/2023 15:57:53 -------------------------------------------------------------------------------- Patient/Caregiver Education Details Patient Name: Date of Service: Dobransky, DA [name] 8/28/2024andnbsp3:00 PM Medical Record Number: [id number] Patient Account Number: [id number] Date of Birth/Gender: Treating RN: [birth year] (77 y.o. [name] Primary Care Physician: [name] Other Clinician: Referring Physician: Treating Physician/Extender: Stark Klein in Treatment: 77 Education Assessment Education Provided To: Patient Education Topics Provided Wound/Skin Impairment: Handouts: Caring for Your Ulcer Methods: Explain/Verbal Responses: Reinforcements needed Electronic Signature(s) Signed: 01/14/2023 6:19:00 PM By: [name] RN, [name] Entered By: [name] on 01/14/2023 16:01:19 Brazill, Dannielle [name] ([id number]) [id number] Page 10 of 17 -------------------------------------------------------------------------------- Wound Assessment Details Patient Name: Date of Service: Defeo, DA [name] 01/14/2023 3:00 PM Medical Record Number: [id number] Patient Account Number: [id number] Date of Birth/Sex: Treating RN: Jul 12, 1945 (77 y.o. [name], [name] Primary Care Victoriana Aziz: [name] Other Clinician: Referring Jonothan Heberle: Treating Jensine Luz/Extender: Valentina Gu Weeks in Treatment: 42 Wound Status Wound Number: 11 Primary Lymphedema Etiology: Wound Location: Left, Posterior Lower Leg Wound Healed - Epithelialized Wounding Event: Gradually Appeared Status: Date Acquired: 10/19/2022 Comorbid Anemia, Lymphedema, Deep Vein  Thrombosis, Hypertension, Weeks Of Treatment: 12 History: Received Radiation Clustered Wound: No Photos Wound Measurements Length: (cm) Width: (cm) Depth: (cm) Area: (cm) Volume: (cm) 0 % Reduction in Area: 100% 0 % Reduction in Volume: 100% 0 Epithelialization: Large (67-100%) 0 0 Wound Description Classification: Full Thickness Without Exposed Suppor Wound Margin: Distinct, outline attached Exudate Amount: Medium Exudate Type: Serous Exudate Color: amber t Structures Foul Odor After Cleansing: No Slough/Fibrino No Wound Bed Granulation Amount: Small (1-33%) Exposed Structure Granulation Quality: Red Fascia Exposed: No Necrotic Amount: Large (67-100%) Fat Layer (Subcutaneous Tissue) Exposed: No Tendon Exposed: No Muscle Exposed: No Joint Exposed: No Bone Exposed: No Periwound Skin Texture Texture Color No Abnormalities Noted: Yes No Abnormalities Noted: Yes Moisture Temperature / Pain No Abnormalities Noted: No Temperature: No Abnormality Dry / Scaly: Yes Maceration: No Treatment Notes Wound #11 (Lower Leg) Wound Laterality: Left, Posterior Cleanser Wenig, Dayquan ([id number]) [id number] Page 11 of 17 Peri-Wound Care Topical Primary Dressing Secondary Dressing Secured With Compression Wrap Compression Stockings Add-Ons Electronic Signature(s) Signed: 01/14/2023 6:19:00 PM By: [name] RN, [name] Entered By: [name] on 01/14/2023 15:57:51 -------------------------------------------------------------------------------- Wound Assessment Details Patient Name: Date of Service: Straub, DA [name] 01/14/2023 3:00 PM Medical Record Number: [id number] Patient Account Number: [id number] Date of Birth/Sex: Treating RN: December 20, 1945 (77 y.o. M) Primary Care Annya Lizana: [name] Other Clinician: Referring Linder Prajapati: Treating Sruthi Maurer/Extender: Valentina Gu Weeks in Treatment: 42 Wound Status Wound Number: 12  Primary Lymphedema Etiology: Wound Location: Left, Anterior Lower Leg Wound Open Wounding Event:  Frostbite Status: Date Acquired: 12/17/2022 Comorbid Anemia, Lymphedema, Deep Vein Thrombosis, Hypertension, Weeks Of Treatment: 4 History: Received Radiation Clustered Wound: No Photos Wound Measurements Length: (cm) 1 Width: (cm) 0.4 Depth: (cm) 0.1 Area: (cm) 0.314 Volume: (cm) 0.031 % Reduction in Area: 91.1% % Reduction in Volume: 95.6% Epithelialization: Medium (34-66%) Tunneling: No Undermining: No Wound Description Classification: Full Thickness Without Exposed Suppor Wound Margin: Distinct, outline attached Exudate Amount: Large Exudate Type: Serous Exudate Color: amber t Structures Foul Odor After Cleansing: No Slough/Fibrino Yes Wound Bed Granulation Amount: Small (1-33%) Exposed Structure Granulation Quality: Red Fascia Exposed: No Andres, Rajinder ([id number]) [id number] Page 12 of 17 Necrotic Amount: Large (67-100%) Fat Layer (Subcutaneous Tissue) Exposed: Yes Necrotic Quality: Adherent Slough Tendon Exposed: No Muscle Exposed: No Joint Exposed: No Bone Exposed: No Periwound Skin Texture Texture Color No Abnormalities Noted: No No Abnormalities Noted: No Callus: No Atrophie Blanche: No Crepitus: No Cyanosis: No Excoriation: No Ecchymosis: No Induration: No Erythema: No Rash: No Hemosiderin Staining: Yes Scarring: No Mottled: No Pallor: No Moisture Rubor: No No Abnormalities Noted: No Dry / Scaly: No Temperature / Pain Maceration: No Temperature: No Abnormality Treatment Notes Wound #12 (Lower Leg) Wound Laterality: Left, Anterior Cleanser Soap and Water Discharge Instruction: May shower and wash wound with dial antibacterial soap and water prior to dressing change. Peri-Wound Care Sween Lotion (Moisturizing lotion) Discharge Instruction: Apply moisturizing lotion as directed Topical Keystone antibiotic  spray Primary Dressing Maxorb Extra Ag+ Alginate Dressing, 4x4.75 (in/in) Discharge Instruction: Apply to wound bed as instructed Secondary Dressing ABD Pad, 8x10 Discharge Instruction: Apply over primary dressing as directed. OptiLock Super Absorbent, 5x5.5 (in/in) Discharge Instruction: Apply directly to wound bed as directed Secured With Compression Wrap Urgo K2, (equivalent to a 4 layer) two layer compression system, regular Discharge Instruction: Apply Urgo K2 as directed (alternative to 4 layer compression). Compression Stockings Add-Ons Electronic Signature(s) Signed: 01/14/2023 6:19:00 PM By: [name] RN, [name] Entered By: [name] on 01/14/2023 15:58:06 -------------------------------------------------------------------------------- Wound Assessment Details Patient Name: Date of Service: Pesantez, DA [name] 01/14/2023 3:00 PM Medical Record Number: [id number] Patient Account Number: [id number] Date of Birth/Sex: Treating RN: [birth year] (77 y.o. M) Primary Care Sanyla Summey: [name] Other Clinician: Referring Charmian Forbis: Treating Kirsten Spearing/Extender: Stark Klein in Treatment: 42 Eilert, Dannielle [name] ([id number]) [id number] Page 13 of 17 Wound Status Wound Number: 13 Primary Lymphedema Etiology: Wound Location: Left, Lateral Lower Leg Wound Open Wounding Event: Gradually Appeared Status: Date Acquired: 12/24/2022 Comorbid Anemia, Lymphedema, Deep Vein Thrombosis, Hypertension, Weeks Of Treatment: 3 History: Received Radiation Clustered Wound: No Photos Wound Measurements Length: (cm) 0.5 Width: (cm) 1 Depth: (cm) 0.1 Area: (cm) 0.393 Volume: (cm) 0.039 % Reduction in Area: 67.5% % Reduction in Volume: 67.8% Epithelialization: Medium (34-66%) Tunneling: No Undermining: No Wound Description Classification: Full Thickness Without Exposed Suppor Wound Margin: Flat and Intact Exudate Amount: Medium Exudate  Type: Serous Exudate Color: amber t Structures Foul Odor After Cleansing: No Slough/Fibrino Yes Wound Bed Granulation Amount: Large (67-100%) Exposed Structure Granulation Quality: Pink Fascia Exposed: No Necrotic Amount: Small (1-33%) Fat Layer (Subcutaneous Tissue) Exposed: Yes Necrotic Quality: Adherent Slough Tendon Exposed: No Muscle Exposed: No Joint Exposed: No Bone Exposed: No Periwound Skin Texture Texture Color No Abnormalities Noted: Yes No Abnormalities Noted: No Hemosiderin Staining: Yes Moisture No Abnormalities Noted: Yes Temperature / Pain Temperature: No Abnormality Treatment Notes Wound #13 (Lower Leg) Wound Laterality: Left, Lateral Cleanser Soap and Water Discharge Instruction: May shower and wash wound with dial antibacterial soap and  water prior to dressing change. Peri-Wound Care Sween Lotion (Moisturizing lotion) Discharge Instruction: Apply moisturizing lotion as directed Topical Keystone antibiotic spray Primary Dressing Maxorb Extra Ag+ Alginate Dressing, 4x4.75 (in/in) Discharge Instruction: Apply to wound bed as instructed Secondary Dressing ABD Pad, 8x10 Tregre, Trevion ([id number]) [id number] Page 14 of 17 Discharge Instruction: Apply over primary dressing as directed. OptiLock Super Absorbent, 5x5.5 (in/in) Discharge Instruction: Apply directly to wound bed as directed Secured With Compression Wrap Urgo K2, (equivalent to a 4 layer) two layer compression system, regular Discharge Instruction: Apply Urgo K2 as directed (alternative to 4 layer compression). Compression Stockings Add-Ons Electronic Signature(s) Signed: 01/14/2023 6:19:00 PM By: [name] RN, [name] Entered By: [name] on 01/14/2023 15:58:32 -------------------------------------------------------------------------------- Wound Assessment Details Patient Name: Date of Service: Lutz, DA [name] 01/14/2023 3:00 PM Medical Record Number:  [id number] Patient Account Number: [id number] Date of Birth/Sex: Treating RN: 1945/06/10 (77 y.o. M) Primary Care Marybella Ethier: [name] Other Clinician: Referring Bion Todorov: Treating Elayah Klooster/Extender: Valentina Gu Weeks in Treatment: 42 Wound Status Wound Number: 6 Primary Lymphedema Etiology: Wound Location: Right, Medial Lower Leg Wound Open Wounding Event: Gradually Appeared Status: Date Acquired: 03/05/2022 Comorbid Anemia, Lymphedema, Deep Vein Thrombosis, Hypertension, Weeks Of Treatment: 42 History: Received Radiation Clustered Wound: Yes Photos Wound Measurements Length: (cm) Width: (cm) Depth: (cm) Clustered Quantity: Area: (cm) Volume: (cm) 7.5 % Reduction in Area: 81.5% 4.5 % Reduction in Volume: 25.8% 0.4 Epithelialization: Small (1-33%) 1 Tunneling: No 26.507 Undermining: No 10.603 Wound Description Classification: Full Thickness Without Exposed Sup Wound Margin: Distinct, outline attached Exudate Amount: Large Exudate Type: Serous Exudate Color: amber port Structures Foul Odor After Cleansing: No Slough/Fibrino Yes Wound Bed Granulation Amount: Small (1-33%) Exposed Structure Harvell, Breylon ([id number]) [id number] Page 15 of 17 Granulation Quality: Pink Fascia Exposed: No Necrotic Amount: Large (67-100%) Fat Layer (Subcutaneous Tissue) Exposed: Yes Necrotic Quality: Adherent Slough Tendon Exposed: No Muscle Exposed: No Joint Exposed: No Bone Exposed: No Periwound Skin Texture Texture Color No Abnormalities Noted: No No Abnormalities Noted: No Callus: No Atrophie Blanche: No Crepitus: No Cyanosis: No Excoriation: No Ecchymosis: No Induration: No Erythema: No Rash: No Hemosiderin Staining: Yes Scarring: Yes Mottled: No Pallor: No Moisture Rubor: No No Abnormalities Noted: No Dry / Scaly: No Temperature / Pain Maceration: No Temperature: No Abnormality Treatment Notes Wound #6  (Lower Leg) Wound Laterality: Right, Medial Cleanser Soap and Water Discharge Instruction: May shower and wash wound with dial antibacterial soap and water prior to dressing change. Peri-Wound Care Sween Lotion (Moisturizing lotion) Discharge Instruction: Apply moisturizing lotion as directed Topical Keystone antibiotic spray Primary Dressing Maxorb Extra Ag+ Alginate Dressing, 4x4.75 (in/in) Discharge Instruction: Apply to wound bed as instructed Secondary Dressing ABD Pad, 8x10 Discharge Instruction: Apply over primary dressing as directed. OptiLock Super Absorbent, 5x5.5 (in/in) Discharge Instruction: Apply directly to wound bed as directed Secured With Compression Wrap Urgo K2, (equivalent to a 4 layer) two layer compression system, regular Discharge Instruction: Apply Urgo K2 as directed (alternative to 4 layer compression). Compression Stockings Add-Ons Electronic Signature(s) Signed: 01/14/2023 6:19:00 PM By: [name] RN, [name] Entered By: [name] on 01/14/2023 15:58:58 -------------------------------------------------------------------------------- Wound Assessment Details Patient Name: Date of Service: Nabers, DA [name] 01/14/2023 3:00 PM Medical Record Number: [id number] Patient Account Number: [id number] Date of Birth/Sex: Treating RN: 1946/03/11 (77 y.o. M) Primary Care Macel Yearsley: [name] Other Clinician: Referring Bear Osten: Treating Joanne Brander/Extender: Valentina Gu Sannes, Dannielle [name] ([id number]) [id number] Page 16 of 17 Weeks in Treatment: 42 Wound Status  Wound Number: 7 Primary Lymphedema Etiology: Wound Location: Left, Medial Lower Leg Wound Open Wounding Event: Gradually Appeared Status: Date Acquired: 03/05/2022 Comorbid Anemia, Lymphedema, Deep Vein Thrombosis, Hypertension, Weeks Of Treatment: 42 History: Received Radiation Clustered Wound: Yes Wound Measurements Length: (cm) Width:  (cm) Depth: (cm) Clustered Quantity: Area: (cm) Volume: (cm) 2.7 % Reduction in Area: 91% 2 % Reduction in Volume: 82% 0.2 Epithelialization: Small (1-33%) 1 Tunneling: No 4.241 Undermining: No 0.848 Wound Description Classification: Full Thickness Without Exposed Sup Wound Margin: Distinct, outline attached Exudate Amount: Large Exudate Type: Serous Exudate Color: amber port Structures Foul Odor After Cleansing: No Slough/Fibrino Yes Wound Bed Granulation Amount: Large (67-100%) Exposed Structure Granulation Quality: Pink Fascia Exposed: No Necrotic Amount: Small (1-33%) Fat Layer (Subcutaneous Tissue) Exposed: Yes Necrotic Quality: Adherent Slough Tendon Exposed: No Muscle Exposed: No Joint Exposed: No Bone Exposed: No Periwound Skin Texture Texture Color No Abnormalities Noted: No No Abnormalities Noted: No Callus: No Atrophie Blanche: No Crepitus: No Cyanosis: No Excoriation: No Ecchymosis: No Induration: No Erythema: No Rash: No Hemosiderin Staining: Yes Scarring: Yes Mottled: No Pallor: No Moisture Rubor: No No Abnormalities Noted: No Dry / Scaly: No Temperature / Pain Maceration: No Temperature: No Abnormality Treatment Notes Wound #7 (Lower Leg) Wound Laterality: Left, Medial Cleanser Soap and Water Discharge Instruction: May shower and wash wound with dial antibacterial soap and water prior to dressing change. Peri-Wound Care Sween Lotion (Moisturizing lotion) Discharge Instruction: Apply moisturizing lotion as directed Topical Keystone antibiotic spray Primary Dressing Maxorb Extra Ag+ Alginate Dressing, 4x4.75 (in/in) Discharge Instruction: Apply to wound bed as instructed Secondary Dressing ABD Pad, 8x10 Discharge Instruction: Apply over primary dressing as directed. OptiLock Super Absorbent, 5x5.5 (in/in) Discharge Instruction: Apply directly to wound bed as directed Cregan, Aundre ([id number]) [id number]  Page 17 of 17 Secured With Compression Wrap Urgo K2, (equivalent to a 4 layer) two layer compression system, regular Discharge Instruction: Apply Urgo K2 as directed (alternative to 4 layer compression). Compression Stockings Add-Ons Electronic Signature(s) Signed: 01/14/2023 6:19:00 PM By: [name] RN, [name] Entered By: [name] on 01/14/2023 15:59:20 -------------------------------------------------------------------------------- Vitals Details Patient Name: Date of Service: Sokolowski, DA [name] 01/14/2023 3:00 PM Medical Record Number: [id number] Patient Account Number: [id number] Date of Birth/Sex: Treating RN: [birth year] (77 y.o. M) Primary Care Latoi Giraldo: [name] Other Clinician: Referring Jairo Bellew: Treating Merlie Noga/Extender: Valentina Gu Weeks in Treatment: 42 Vital Signs Time Taken: 15:57 Temperature (F): 98.1 Height (in): 74 Pulse (bpm): 79 Weight (lbs): 250 Respiratory Rate (breaths/min): 18 Body Mass Index (BMI): 32.1 Blood Pressure (mmHg): 184/80 Reference Range: 80 - 120 mg / dl Electronic Signature(s) Signed: 01/14/2023 4:17:26 PM By: Karl Ito Entered By: Karl Ito on 01/14/2023 15:57:45

## 2023-01-21 ENCOUNTER — Encounter (HOSPITAL_BASED_OUTPATIENT_CLINIC_OR_DEPARTMENT_OTHER): Payer: Medicare Other | Attending: Physician Assistant | Admitting: Physician Assistant

## 2023-01-21 DIAGNOSIS — I87333 Chronic venous hypertension (idiopathic) with ulcer and inflammation of bilateral lower extremity: Secondary | ICD-10-CM | POA: Insufficient documentation

## 2023-01-21 DIAGNOSIS — Z7901 Long term (current) use of anticoagulants: Secondary | ICD-10-CM | POA: Insufficient documentation

## 2023-01-21 DIAGNOSIS — N1832 Chronic kidney disease, stage 3b: Secondary | ICD-10-CM | POA: Insufficient documentation

## 2023-01-21 DIAGNOSIS — L97828 Non-pressure chronic ulcer of other part of left lower leg with other specified severity: Secondary | ICD-10-CM | POA: Diagnosis not present

## 2023-01-21 DIAGNOSIS — I130 Hypertensive heart and chronic kidney disease with heart failure and stage 1 through stage 4 chronic kidney disease, or unspecified chronic kidney disease: Secondary | ICD-10-CM | POA: Insufficient documentation

## 2023-01-21 DIAGNOSIS — Z8546 Personal history of malignant neoplasm of prostate: Secondary | ICD-10-CM | POA: Diagnosis not present

## 2023-01-21 DIAGNOSIS — L84 Corns and callosities: Secondary | ICD-10-CM | POA: Insufficient documentation

## 2023-01-21 DIAGNOSIS — Z86718 Personal history of other venous thrombosis and embolism: Secondary | ICD-10-CM | POA: Diagnosis not present

## 2023-01-21 DIAGNOSIS — L97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 other part of right lower leg with other specified severity: Secondary | ICD-10-CM | POA: Diagnosis not present

## 2023-01-21 DIAGNOSIS — I5032 Chronic diastolic (congestive) heart failure: Secondary | ICD-10-CM | POA: Insufficient documentation

## 2023-01-21 DIAGNOSIS — I890 Lymphedema, not elsewhere classified: Secondary | ICD-10-CM | POA: Insufficient documentation

## 2023-01-21 NOTE — Progress Notes (Signed)
Cornfield, [name] ([id number]) [id number] Page 1 of 13 Visit Report for 01/21/2023 Chief Complaint Document Details Patient Name: Date of Service: Mcnairy, [name] 01/21/2023 3:00 PM Medical Record Number: [id number] Patient Account Number: [id number] Date of Birth/Sex: Treating RN: December 20, 1945 (77 y.o. M) Primary Care Provider: [name] Other Clinician: Referring Provider: Treating Provider/Extender: [name] Weeks in Treatment: 69 Information Obtained from: Patient Chief Complaint 02/23/2020; patient is here for wounds on his bilateral lower legs in the setting of severe lymphedema 03/26/2022; patient is here for wounds on his bilateral lower legs medial aspect Electronic Signature(s) Signed: 01/21/2023 3:48:52 PM By: [name] PA-C Entered By: [name] on 01/21/2023 12:48:52 -------------------------------------------------------------------------------- HPI Details Patient Name: Date of Service: Hatler, [name] 01/21/2023 3:00 PM Medical Record Number: [id number] Patient Account Number: [id number] Date of Birth/Sex: Treating RN: 1946/03/20 (77 y.o. M) Primary Care Provider: [name] Other Clinician: Referring Provider: Treating Provider/Extender: [name] Weeks in Treatment: 66 History of Present Illness HPI Description: ADMISSION 02/23/2020 Patient is a 77 year old man who lives in [place] who arrives accompanied by his wife. He has a history of chronic lymphedema and venous insufficiency in his bilateral lower legs which may have something to do that with having a history of DVT as well as being treated for prostate cancer. In any case he recently got compression pumps at home but compliance has been an issue here. He has compression stockings however they are probably not sufficient enough to control swelling. They tell us that things deteriorated for him in late August he was admitted to  [facility] for 7 days. This was with cellulitis I think of his bilateral lower legs. Discharge he was noted to have wounds on his bilateral lower legs. He was discharged on Bactrim. They tried to get him home health through [facility] of course they declined him. His wife is been wrapping these applying some form of silver foam dressing. He has a history of wounds before although nothing that would not heal with basic home topical dressings. He has 2 areas on the left medial, left anterior and left lateral and a smaller area on the right medial. All of these have considerable depth. Past medical history includes iron deficiency anemia, lymphedema followed by the rehab center at [facility] with lymphedema wraps I believe, DVT on chronic anticoagulation, prostate cancer, chronic venous insufficiency, hypertension. As mentioned he has compression pumps but does not use them. ABIs in our clinic were noncompressible bilaterally 10/14; patient with severe bilateral lymphedema right greater than left. He came in with bilateral lower extremity wounds left greater than right. Even though the right side has more of the edema most of the wounds here almost closed on the right medial. He has 3 remaining wounds on the left We have been using silver alginate under 4-layer compression I have been trying to get him to be compliant with his external compression pumps 10/21; patient with 3 small wounds on the left leg and 1 on the right medial in the setting of severe lymphedema and chronic venous insufficiency. We have been using silver alginate under 4-layer compression he is using his external compression pumps twice a day 11/4; ARTERIAL STUDIES on the right show an ABI of 1.02 TBI of 0.858 with biphasic waveforms on the left 0.98 with a TBI of 0.55 and biphasic waveforms. Does not look like he has significant arterial disease. We are treating him for  lymphedema he has compression pumps. He has  punched-out areas on the left anterior left lateral and right medial lower extremities 11/11; after we obtained his arterial studies I put him in 4 layer compression. He is using his compression pumps probably once a day although I have asked Knobel, [name] ([id number]) [phone].pdf Page 2 of 13 him to do twice. Primary dressing to the wound is silver collagen he has severe lymphedema likely secondary to chronic venous insufficiency. Wounds on the left lateral, left medial and left anterior and a small area on the right medial 12/2; the area on the right anterior lower leg has healed. We initially thought that the area medially had healed as well however when her discharge nurse came in she detected fluid in the wound simply opened up. This is actually worse than I remember this pain. The area on the left lateral potentially slightly smaller He is also complaining about pain in his left hand he says that this is actually been getting some better he has been using topical creams on this. She asked that I look at this 12/9 after last weeks issues we have 2 wounds one on the right medial lower leg and 1 on the left lateral. Both of these are in the same condition. I think because of thickened skin secondary to chronic lymphedema these wounds actually have depth of almost 0.8 cm. 12/16; the patient has 2 small but deep wounds one on the right medial and one on the left lateral. The right medial is actually the worst of these. He arrives in clinic today with absolutely terrible edema in the right leg apparently his 4-layer wrap fell down to just above his ankle he did not think about this he is apparently been continuing to use his compression pump twice a day. The left leg looks a lot better. 05/09/2020 upon evaluation today patient appears to be doing decently well in regard to his wounds. Everything is measuring smaller the right leg still has a little bit deeper wound in the  left seems to be almost completely healed in my opinion I am very pleased in general with how things are progressing. He has a 4- layer compression wrap we have been using endoform today we will probably have to use collagen just based on the fact that we do not have endoform it is on order. 1/6; the patient's wound on the left lateral lower leg has healed. Still has 1 on the right medial. 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 Using compression pumps at home twice a day. 1/13; left lateral lower leg is still healed. He has a deep punched out rectangular shaped wound on the right medial calf. Looking down at this it appears that he is attempting to epithelialize around the edges of the wound and on the base as well. His edema is reasonably well controlled we have been using collagen with absolutely no effect 1/20; left lateral lower leg remains closed he has extremitease stockings. The area on the right medial calf I aggressively debrided last week measures larger but the surface looks better. We have been using Hydrofera Blue. We ran Oasis through his insurance but we have not seen the results of this 1/27; left lower leg wound with chronic venous insufficiency and secondary lymphedema. I did aggressive debridement on this last week the wound seems to have come in healthy looking surface using Hydrofera Blue. He was denied for Oasis 2/3; small divot in the right medial lower leg.  Under illumination the walls of this divot are epithelialized however the base has slough which I removed with a curette we have been using Hydrofera Blue 2/10 small divot on the right medial lower leg pinpoint illumination at the base of this cone-shaped wound. We have been using Hydrofera Blue but I will switch to calcium alginate this week 2/17; the small divot on the right medial lower leg is fully epithelialized. There is no visible open area under illumination. He has his own stocking for the  right leg similar to the one he has been wearing on the left. 03/26/2022; READMISSION This is a now 77 year old man that we had in the clinic from 02/23/2020 through 07/05/2020. At that point he had bilateral lower extremity wounds left greater than right in the setting of severe lymphedema. He had already obtained compression pumps ordered for him I think from the wound care clinic in [facility] so I do not really have record of what he has been using. He claims to be using them once a day but there is a problem with the sleeve on the left leg. About 2 weeks ago he was hospitalized from 03/11/2022 through 03/14/2022 with diastolic congestive heart failure. 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 He was diuresed. Developed some prerenal azotemia and he has not been taking any diuretics currently. He has not been putting stockings on his legs since he got out of hospital and still has his legs dependent for long periods. Past medical history history of prostate cancer treated with prostatectomy and radiation this was apparently about 8 years ago, history of DVT on chronic Coumadin, history of lymphedema was managed for a while at the clinic in [place]. History of inguinal hernia repair in September 22, hypertension, stage IIIb chronic renal failure ABIs today were noncompressible on the right 1.12 on the left 04-02-2022 upon evaluation today patient appears to be doing well currently in regard to his legs I do feel like both areas that are draining are actually much drier than they were in the picture last week although the left is drier than the right. He is tolerating the 4-layer compression wraps at this point he did contact the pump company and they are actually working on getting him a new compression sleeve for one of his legs which have previously popped and was not functioning properly. 04-23-2022 upon evaluation today patient appears  to be doing well currently in regard to his wounds on the legs. I am actually very pleased with where things stand and I do feel like that we are headed in the right direction. Fortunately there is no sign of active infection locally or systemically at this time. 05-07-2022 upon evaluation today patient appears to be doing well currently in regard to his wounds in fact things are showing signs of improvement which is good news I do not see too much that actually appears to be open and I am very pleased in that regard. No fevers, chills, nausea, vomiting, or diarrhea. 05-21-2022 upon evaluation today patient appears to be doing somewhat poorly in regard to drainage of his lower extremities bilaterally. The right is greater than left as far as the weeping area. Nonetheless it seems to be getting worse not better. He actually has pitting edema which is at least 2+ to the thighs and I am concerned about the fact that he is may be fluid overloaded in general and that is the reason why  we cannot get this under control. I know he is not using his pumps all the time because he actually told the nurse that he was either going to pump or he was going to use his fluid pills but not do both. For that reason I do think that he needs to be really doing both in order to get the fluid out as effectively as possible obviously with the 4-layer compression wraps were doing as much as we can from a compression standpoint but it is really not enough. He tells me that he elevates his leg is much as he can in between pumping and other activity throughout the day. 05-28-2022 upon evaluation today patient appears to be doing better in regard to his wounds although the measurements may be a little bit larger this is a very difficult wound to heal it is very indistinct in a lot of areas. Nonetheless there is can be some need for sharp debridement in regard to both medial and lateral legs. Fortunately I see no signs of active  infection locally nor systemically at this time. No fevers, chills, nausea, vomiting, or diarrhea. 06-04-2022 upon evaluation today patient appears to be doing poorly in general in regard to the wounds on his legs. He still continues to have a tremendous amount of fluid not just in the lower portion of his leg but to be honest his thigh where he has 2-3+ pitting edema in the thigh as well. Unfortunately I do not know that we will be able to get this healed effectively and keep it healed on the lower extremities unless he gets the overall fluid situation taking and under control. Fortunately I do not see any signs of infection locally nor systemically which is great news. He just seems to be very fluid overloaded. 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 He has been tolerating the dressing changes with the compression wraps without complication. Fortunately I do not see any evidence of infection at this time which is great news. No fevers, chills, nausea, vomiting, or diarrhea. 06-18-2022 upon evaluation today patient appears to be doing well currently in regard to his wounds as far as not looking like they are terribly infected but nonetheless I am concerned about a subacute infection secondary to the fact that he continues to have spreading despite the compression therapy. We actually did do an Unna boot on him last week this is actually the first wrap that actually stayed up everything else has been sliding down quite significantly. Fortunately there does not appear to be any signs of infection systemically at this time. With that being said I do believe that locally there seems to be an issue going on here and again I Ernie Hew do a PCR culture to see what that shows also think that I am going to put him on a broad-spectrum antibiotic, doxycycline to see how that will help as well. He does  tell me that coming into the clinic today that he was feeling short of breath like "he was about to have a heart attack" because he was having such a hard time breathing. He says that he told this to Dr. Jodelle Green his cardiologist as well when he was evaluated in the [place] ([id number]) [phone].pdf Page 3 of 13 past 1 to 2 weeks. 07-02-2022 upon evaluation today patient appears to be doing poorly currently in regard to his wound. He has been tolerating the dressing changes.  Unfortunately he has not had any compression wraps on for the past week because he was unable to make it in for his appointment last week. With that being said he has a significant amount of drainage he tells me has been using his pumps but despite this in the pumps he still has been draining quite a bit. The drainage is also somewhat purulent unfortunately. We did attempt to get in touch with his cardiologist last week unfortunately we were unable to get up with him I did advise that the patient needs to get in touch with him upon leaving today in order to make sure they know he is on the new antibiotics I am going to send him this will be Levaquin and Augmentin. 07-09-2022 upon evaluation today patient appears to be doing about the same in regard to his legs he may have just a slight amount of improvement with regard to the drainage probably Keystone topical antibiotics are helping in this regard to some degree. Fortunately there does not appear to be any signs of active infection systemically which is great news. No fevers, chills, nausea, vomiting, or diarrhea. 07-16-2022 upon evaluation today patient appears to be doing well currently in regard to his wound. He has been tolerating the dressing changes without complication. Fortunately there does not appear to be any signs of active infection locally nor systemically at this time. With that being said he cannot keep the wraps up he tells me on the  left side he had to cut this down because it got too tight. He has been using his pumps but he is on the right side the wrap actually straight down causing some pushing around the central part of his leg just below the calf I think this is a bigger risk for him that help at this point. I think that we may need to try something different he should be getting his compression socks shortly he tells me they were ordered last Thursday. 3/6; ; this is a patient who lives in [place]. He has severe bilateral lymphedema. He has compression pumps, we have been using kerlix Ace wrap Keystone. He is changing the dressing. We do not have home health. 08-06-2022 upon evaluation today patient appears to be doing a little better in regard to his wounds in general at this point. Fortunately there does not appear to be any signs of active infection locally nor systemically at this time which is great news and overall I am extremely pleased with where we stand today. 08-13-2022 upon evaluation today patient appears to actually be doing significantly better compared to last week. He actually did go to the hospital I told him that he needed to when he left here and he actually did go. With that being said they actually ended up admitting him he was having shortness of breath and I thought it might be related to congestive heart failure turns out he actually had a pulmonary embolism. Subsequently they were able to get him off of the Coumadin switching over to Eliquis to get things stabilized in that regard they also had them wrapped and got his swelling under control on his legs he actually looks much better pretty much across the board at this point. I am very pleased in that regard. With that being said I am very happy that he finally went that could have been a very dangerous situation. 08-20-2022 upon evaluation today patient appears to be doing well currently in regard to his wound. Has been  tolerating the dressing changes  without complication. Fortunately there does not appear to be any signs of active infection locally nor systemically at this time. I think his legs are doing better there is some need for sharp debridement today. 08-27-2022 upon evaluation patient is actually making excellent progress. I am actually very pleased with where he stands and I think that he is moving in the right direction. In general I think that we are looking pretty good at the moment. 09-03-2022 upon evaluation today patient appears to be doing well currently in regard to his wound. He is actually tolerating dressing changes on the left and right leg without complication. Fortunately I do not see any need for debridement of the left leg the right leg I think we probably do some need to perform some debridement here. 09-10-2022 upon evaluation today patient's wounds actually showed signs of improvement in both legs I do not see much is going require debridement today which was great news. Fortunately I do not see any evidence of infection which I think is also excellent he seems to be using his pumps and doing everything right I am happy about how this is progressing at this point. 09-17-2022 upon evaluation today patient appears to be doing decently well in regard to his wounds. He has been tolerating the dressing changes without complication. Fortunately there does not appear to be any signs of active infection at this time which is good news. 09-24-2022 upon evaluation today patient appears to be doing well currently in regard to his wounds. He has been tolerating the dressing changes without complication. Fortunately there does not appear to be any signs of active infection locally nor systemically which is great news. No fevers, chills, nausea, vomiting, or diarrhea. 10-08-2022 upon evaluation today patient appears to be doing excellent currently in regard to his wound. He has been tolerating the dressing changes without complication  though it does not sound like he has been using his compression wraps for a bit here. He does think he was doing better with the [facility] topical antibiotics we can definitely go back to using that but I think the biggest issue here is that his swelling is just very out of control and needs to be under control. I discussed that with him today. 10-15-2022 upon evaluation today patient appears to be doing well currently in regard to his wounds. He is actually making some progress which is good news. Fortunately I do not see any evidence of active infection locally nor systemically which is great news as well. No fevers, chills, nausea, vomiting, or diarrhea. He does have a callused area on the plantar aspect of his left foot which is actually causing some pain and he wonders if I can trim this down for him. 10-22-2022 upon evaluation today patient seems to be making progress. He is actually doing quite well and very pleased in that regard. I do not see any signs of active infection at this time. 11-05-2022 upon evaluation patient appears to be making progress although slowly towards closure. He seems to be doing well with regard to his legs were still using the [facility] topical antibiotics and he seems to be doing quite well. He is going require some sharp debridement today. 11-19-2022 upon evaluation today patient unfortunately appears to be extremely swollen at this point. He tells me that he ran out of supplies he also tells me his leg started leaking more because of not having supplies he was unable to wear his compression  wraps that is the juxta fit compression wraps. Therefore his legs are extremely swollen much larger than normal and do not appear to be doing well at all today. He is going require some debridement I also think he is going require [place] to perform compression wrapping today. 11-26-2022 upon evaluation today patient appears to be doing well currently in regard to his legs as far as infection  is concerned I see nothing that appears to be infected. Fortunately I do not see any signs of active infection systemically either which is also good news. With that being said he still is extremely swollen as far as his legs are concerned. I do not see any signs of overall worsening but also do not see any signs of significant improvement which is the major issue here. 12-03-2022 upon evaluation today patient appears to be doing poorly still in regard to his legs the apparently has been taken off the wraps and not leaving the week by week. With that being said he has not had really the dressings to put on the release that is running out and subsequently though he is using his wraps I am not sure that he has been keeping them on like he needs to. I explained to him I really wanted to have the wraps that I put on him left on until he comes back to see me so that we can keep the compression under better control. He voiced understanding today he tells me that he was "confused about that". Nonetheless based on what I am seeing right now also think that he is up on his feet too much I think he needs to get the feet elevated. 7/24; this is a patient with severe bilateral stage III lymphedema. On the right lower leg medially is a large area of macerated denuded skin was too small deeper areas in this. On the left leg he is 2 areas that are a little more standard in terms of wounds. Massive lymphedema bilaterally. He has compression pumps at home which he uses for 1 hour twice a day 12-17-2022 upon evaluation today patient's legs though a little bit smaller still continue to have significant issues with weeping here this just does not dry. I really think he needs to be changing this daily and this means we will probably have to try to go back to the Velcro wraps and give this a shot. I think may be going to the Velcro wraps, changing the dressing to a superabsorber like XtraSorb or the like, and have him elevate  his legs and do his pumps 3 times per day is Mccarey, Wilian ([id number]) [phone].pdf Page 4 of 13 probably going to be the way to go to try to see if we can make some improvement here. 12-24-2022 upon evaluation today patient unfortunately is still struggling to get anything under control. We have been extremely aggressive and trying to: Control his swelling everything we do however seems to be met with resistance. He tells me that he is pumping 3 times per day with his lymphedema pumps. He also tells me that he is try to elevate his legs is much as possible and that subsequently he has been keeping his compression wraps on. He goes back and forth between saying that he can keep our wraps on and not but right now I really do not see that he is doing well with the juxta fit is at least not from the standpoint of improving the overall appearance I think it  does to some degree help maintain but we are still struggling here. I have voiced a concern to the patient that despite everything you are doing I am still not seeing a lot of improvement here and to be honest I am not sure what is can take get this under control he may have to go into the hospital for admission. 12-31-2022 upon evaluation today patient appears to be doing well currently in regard to his wounds. They seem a little better although he has several pustules around the legs in general but has been concerned about infection. I am actually going to go ahead and see about getting an antibiotic sent into the pharmacy today although I am going to obtain a PCR culture as well. 01-07-2023 upon evaluation today patient appears to be doing well currently in regard to his wound. Has been tolerating the dressing changes without complication. He actually seems to be doing much better. He did have a PCR culture which showed Serratia as well and the prominent organisms here. Levaquin was actually a good option to treat the  Serratia along with the other organisms that were problematic including E. coli. Nonetheless I think coupled with the topical Keystone antibiotics and the alginate he is really doing quite well today. 8/28; patient with severe bilateral lymphedema he has wounds bilaterally on his lower legs but they look a lot better today. We are using silver alginate and Keystone with Urgo K2 lite wraps. He comes in today with his wounds looking quite good. He was prescribed Levaquin last time I think he will complete this in a few days. 01-21-2023 upon evaluation today patient appears to be doing worse in regard to his swelling took his wrap off he tells me he believes on Saturday. With that being said his leg is significantly swollen since that time and unfortunately does not appear to be doing nearly as good as it was last time I saw him 2 weeks ago or even last week for that matter. I am very concerned about this. Electronic Signature(s) Signed: 01/21/2023 5:54:19 PM By: [name] PA-C Entered By: [name] on 01/21/2023 14:54:19 -------------------------------------------------------------------------------- Physical Exam Details Patient Name: Date of Service: Climer, [name] 01/21/2023 3:00 PM Medical Record Number: [id number] Patient Account Number: [id number] Date of Birth/Sex: Treating RN: [birth year] (77 y.o. M) Primary Care Provider: [name] Other Clinician: Referring Provider: Treating Provider/Extender: [name] Weeks in Treatment: 57 Constitutional Obese and well-hydrated in no acute distress. Respiratory normal breathing without difficulty. Psychiatric this patient is able to make decisions and demonstrates good insight into disease process. Alert and Oriented x 3. pleasant and cooperative. Notes Upon inspection patient's wound bed actually showed signs of poor granulation and epithelization at this time. He is extremely swollen and I am unsure why  he continues to be so swollen he tells me that he is doing everything that I have recommended for him. He is using his pumps, elevating his legs, and he tells me wearing compression although again he took the compression wraps off on Saturday because it was hurting so bad. He had blisters up underneath the wraps and again he is extremely swollen I think that this is a big part of the issue that his swelling and edema is just way out of control I am unsure exactly what is going on. Electronic Signature(s) Signed: 01/21/2023 5:55:07 PM By: [name] PA-C Previous Signature: 01/21/2023 5:54:56 PM Version By: [name] PA-C Entered By: [name]  on 01/21/2023 14:55:07 Physician Orders Details -------------------------------------------------------------------------------- Snethen, [name] ([id number]) [phone].pdf Page 5 of 13 Patient Name: Date of Service: Gammel, [name] 01/21/2023 3:00 PM Medical Record Number: [id number] Patient Account Number: [id number] Date of Birth/Sex: Treating RN: 1945-10-03 (77 y.o. [name]: [name] Other Clinician: Referring Provider: Treating Provider/Extender: [name] in Treatment: 75 Verbal / Phone Orders: No Diagnosis Coding ICD-10 Coding Code Description [phone] Chronic venous hypertension (idiopathic) with ulcer and inflammation of bilateral lower extremity I89.0 Lymphedema, not elsewhere classified 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 other part of right lower leg with other specified severity L84 Corns and callosities Follow-up Appointments ppointment in 1 week. [name] PA Wednesday ****extra time 60 minutes**** Return A 01/28/23 at 2:45pm ppointment in 2 weeks. [name] PA Return A Return appointment in 3 weeks. - Please see front desk to schedule  appointment Return appointment in 1 month. - Please see front desk to schedule appointment Other: - Please ask your Primary Care Practioner or Foot Doctor to order orthotic shoes. Anesthetic (In clinic) Topical Lidocaine 5% applied to wound bed Bathing/ Shower/ Hygiene May shower with protection but do not get wound dressing(s) wet. Protect dressing(s) with water repellant cover (for example, large plastic bag) or a cast cover and may then take shower. Edema Control - Lymphedema / SCD / Other Lymphedema Pumps. Use Lymphedema pumps on leg(s) 2-3 times a day for 45-60 minutes. If wearing any wraps or hose, do not remove them. Continue exercising as instructed. - *****pump 3 times a day for an hour each time.***** Elevate legs to the level of the heart or above for 30 minutes daily and/or when sitting for 3-4 times a day throughout the day. - +++ Important+++ ELEVATE THE LEGS>>>>>>>>>> Avoid standing for long periods of time. Exercise regularly Compression stocking or Garment 30-40 mm/Hg pressure to: - use juxtafits to both legs daily if compression wraps removed Other Edema Control Orders/Instructions: - When sitting please keep your feet up, keep legs at Heart Level or above. Use pillows behind calf to for comfort. Off-Loading Other: - When sitting please keep your feet up, keep legs at Heart Level or above. Use pillows behind calf to for comfort. Additional Orders / Instructions Follow Nutritious Diet - Try and increase protein intake. Goal of protein intake is 60g-100g. Wound Treatment Wound #12 - Lower Leg Wound Laterality: Left, Anterior Cleanser: Soap and Water 1 x Per Week/30 Days Discharge Instructions: May shower and wash wound with dial antibacterial soap and water prior to dressing change. Peri-Wound Care: Sween Lotion (Moisturizing lotion) 1 x Per Week/30 Days Discharge Instructions: Apply moisturizing lotion as directed Topical: Keystone antibiotic spray 1 x Per Week/30  Days Discharge Instructions: ON HOLD 01/21/23 Prim Dressing: Maxorb Extra Ag+ Alginate Dressing, 4x4.75 (in/in) 1 x Per Week/30 Days ary Discharge Instructions: Apply to wound bed as instructed Secondary Dressing: ABD Pad, 8x10 1 x Per Week/30 Days Discharge Instructions: Apply over primary dressing as directed. Secondary Dressing: OptiLock Super Absorbent, 5x5.5 (in/in) (Generic) 1 x Per Week/30 Days Discharge Instructions: Apply directly to wound bed as directed Compression Wrap: Urgo K2, (equivalent to a 4 layer) two layer compression system, regular 1 x Per Week/30 Days Discharge Instructions: Apply Urgo K2 as directed (alternative to 4 layer compression). Wound #13 - Lower Leg Wound Laterality: Left, Lateral Cleanser: Soap and Water 1 x  Per Week/30 Days Discharge Instructions: May shower and wash wound with dial antibacterial soap and water prior to dressing change. Staver, [name] ([id number]) [id number] Page 6 of 13 Peri-Wound Care: Sween Lotion (Moisturizing lotion) 1 x Per Week/30 Days Discharge Instructions: Apply moisturizing lotion as directed Topical: Keystone antibiotic spray 1 x Per Week/30 Days Discharge Instructions: ON HOLD 01/21/23 Prim Dressing: Maxorb Extra Ag+ Alginate Dressing, 4x4.75 (in/in) 1 x Per Week/30 Days ary Discharge Instructions: Apply to wound bed as instructed Secondary Dressing: ABD Pad, 8x10 1 x Per Week/30 Days Discharge Instructions: Apply over primary dressing as directed. Secondary Dressing: OptiLock Super Absorbent, 5x5.5 (in/in) (Generic) 1 x Per Week/30 Days Discharge Instructions: Apply directly to wound bed as directed (if available) Compression Wrap: Urgo K2, (equivalent to a 4 layer) two layer compression system, regular 1 x Per Week/30 Days Discharge Instructions: Apply Urgo K2 as directed (alternative to 4 layer compression). Wound #6 - Lower Leg Wound Laterality: Right, Medial Cleanser: Soap and Water 1 x Per  Week/30 Days Discharge Instructions: May shower and wash wound with dial antibacterial soap and water prior to dressing change. Peri-Wound Care: Sween Lotion (Moisturizing lotion) 1 x Per Week/30 Days Discharge Instructions: Apply moisturizing lotion as directed Topical: Keystone antibiotic spray 1 x Per Week/30 Days Prim Dressing: Maxorb Extra Ag+ Alginate Dressing, 4x4.75 (in/in) 1 x Per Week/30 Days ary Discharge Instructions: Apply to wound bed as instructed Secondary Dressing: ABD Pad, 8x10 1 x Per Week/30 Days Discharge Instructions: Apply over primary dressing as directed. Secondary Dressing: OptiLock Super Absorbent, 5x5.5 (in/in) (Generic) 1 x Per Week/30 Days Discharge Instructions: Apply directly to wound bed as directed (if available) Compression Wrap: Urgo K2, (equivalent to a 4 layer) two layer compression system, regular 1 x Per Week/30 Days Discharge Instructions: Apply Urgo K2 as directed (alternative to 4 layer compression). Wound #7 - Lower Leg Wound Laterality: Left, Medial Cleanser: Soap and Water 1 x Per Week/30 Days Discharge Instructions: May shower and wash wound with dial antibacterial soap and water prior to dressing change. Peri-Wound Care: Sween Lotion (Moisturizing lotion) 1 x Per Week/30 Days Discharge Instructions: Apply moisturizing lotion as directed Topical: Keystone antibiotic spray 1 x Per Week/30 Days Discharge Instructions: on hold (01/21/23) Prim Dressing: Maxorb Extra Ag+ Alginate Dressing, 4x4.75 (in/in) 1 x Per Week/30 Days ary Discharge Instructions: Apply to wound bed as instructed Secondary Dressing: ABD Pad, 8x10 1 x Per Week/30 Days Discharge Instructions: Apply over primary dressing as directed. (If available) Secondary Dressing: OptiLock Super Absorbent, 5x5.5 (in/in) (Generic) 1 x Per Week/30 Days Discharge Instructions: Apply directly to wound bed as directed Compression Wrap: Urgo K2, (equivalent to a 4 layer) two layer compression  system, regular 1 x Per Week/30 Days Discharge Instructions: Apply Urgo K2 as directed (alternative to 4 layer compression). Electronic Signature(s) Signed: 01/21/2023 5:38:10 PM By: [name] RN Signed: 01/21/2023 6:11:34 PM By: [name] PA-C Entered By: [name] on 01/21/2023 13:29:05 Sandra, [name] ([id number]) [id number] Page 7 of 13 -------------------------------------------------------------------------------- Problem List Details Patient Name: Date of Service: Jollie, [name] 01/21/2023 3:00 PM Medical Record Number: [id number] Patient Account Number: [id number] Date of Birth/Sex: Treating RN: Apr 24, 1946 (77 y.o. M) Primary Care Provider: [name] Other Clinician: Referring Provider: Treating Provider/Extender: [name] in Treatment: 51 Active Problems ICD-10 Encounter Code Description Active Date MDM Diagnosis I87.333 Chronic venous hypertension (idiopathic) with ulcer and inflammation of 06/11/2022 No Yes bilateral lower extremity I89.0 Lymphedema, not elsewhere classified 03/26/2022 No Yes  N82.956 Non-pressure chronic ulcer of other part of left lower leg with other specified 03/26/2022 No Yes severity 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 other part of right lower leg with other specified 03/26/2022 No Yes severity L84 Corns and callosities 10/15/2022 No Yes Inactive Problems Resolved Problems Electronic Signature(s) Signed: 01/21/2023 3:48:31 PM By: [name] PA-C Entered By: [name] on 01/21/2023 12:48:31 -------------------------------------------------------------------------------- Progress Note Details Patient Name: Date of Service: Molock, [name] 01/21/2023 3:00 PM Medical Record Number: [id number] Patient Account Number: [id number] Date of Birth/Sex: Treating RN: December 14, 1945 (77 y.o. M) Primary Care Provider: [name] Other Clinician: Referring Provider: Treating  Provider/Extender: [name] Weeks in Treatment: 66 Subjective Chief Complaint Information obtained from Patient 02/23/2020; patient is here for wounds on his bilateral lower legs in the setting of severe lymphedema 03/26/2022; patient is here for wounds on his bilateral lower legs medial aspect History of Present Illness (HPI) ADMISSION 02/23/2020 Salomon, [name] ([id number]) [id number] Page 8 of 13 Patient is a 77 year old man who lives in [place] who arrives accompanied by his wife. He has a history of chronic lymphedema and venous insufficiency in his bilateral lower legs which may have something to do that with having a history of DVT as well as being treated for prostate cancer. In any case he recently got compression pumps at home but compliance has been an issue here. He has compression stockings however they are probably not sufficient enough to control swelling. They tell us that things deteriorated for him in late August he was admitted to [facility] for 7 days. This was with cellulitis I think of his bilateral lower legs. Discharge he was noted to have wounds on his bilateral lower legs. He was discharged on Bactrim. They tried to get him home health through [facility] of course they declined him. His wife is been wrapping these applying some form of silver foam dressing. He has a history of wounds before although nothing that would not heal with basic home topical dressings. He has 2 areas on the left medial, left anterior and left lateral and a smaller area on the right medial. All of these have considerable depth. Past medical history includes iron deficiency anemia, lymphedema followed by the rehab center at [facility] with lymphedema wraps I believe, DVT on chronic anticoagulation, prostate cancer, chronic venous insufficiency, hypertension. As mentioned he has compression pumps but does not use them. ABIs  in our clinic were noncompressible bilaterally 10/14; patient with severe bilateral lymphedema right greater than left. He came in with bilateral lower extremity wounds left greater than right. Even though the right side has more of the edema most of the wounds here almost closed on the right medial. He has 3 remaining wounds on the left We have been using silver alginate under 4-layer compression I have been trying to get him to be compliant with his external compression pumps 10/21; patient with 3 small wounds on the left leg and 1 on the right medial in the setting of severe lymphedema and chronic venous insufficiency. We have been using silver alginate under 4-layer compression he is using his external compression pumps twice a day 11/4; ARTERIAL STUDIES on the right show an ABI of 1.02 TBI of 0.858 with biphasic waveforms on the left 0.98 with a TBI of 0.55 and biphasic waveforms. Does not look like he has significant arterial disease. We are treating him for lymphedema he has compression pumps. He has punched-out areas  on the left anterior left lateral and right medial lower extremities 11/11; after we obtained his arterial studies I put him in 4 layer compression. He is using his compression pumps probably once a day although I have asked him to do twice. Primary dressing to the wound is silver collagen he has severe lymphedema likely secondary to chronic venous insufficiency. Wounds on the left lateral, left medial and left anterior and a small area on the right medial 12/2; the area on the right anterior lower leg has healed. We initially thought that the area medially had healed as well however when her discharge nurse came in she detected fluid in the wound simply opened up. This is actually worse than I remember this pain. The area on the left lateral potentially slightly smaller He is also complaining about pain in his left hand he says that this is actually been getting some better he  has been using topical creams on this. She asked that I look at this 12/9 after last weeks issues we have 2 wounds one on the right medial lower leg and 1 on the left lateral. Both of these are in the same condition. I think because of thickened skin secondary to chronic lymphedema these wounds actually have depth of almost 0.8 cm. 12/16; the patient has 2 small but deep wounds one on the right medial and one on the left lateral. The right medial is actually the worst of these. He arrives in clinic today with absolutely terrible edema in the right leg apparently his 4-layer wrap fell down to just above his ankle he did not think about this he is apparently been continuing to use his compression pump twice a day. The left leg looks a lot better. 05/09/2020 upon evaluation today patient appears to be doing decently well in regard to his wounds. Everything is measuring smaller the right leg still has a little bit deeper wound in the left seems to be almost completely healed in my opinion I am very pleased in general with how things are progressing. He has a 4- layer compression wrap we have been using endoform today we will probably have to use collagen just based on the fact that we do not have endoform it is on order. 1/6; the patient's wound on the left lateral lower leg has healed. Still has 1 on the right medial. 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 Using compression pumps at home twice a day. 1/13; left lateral lower leg is still healed. He has a deep punched out rectangular shaped wound on the right medial calf. Looking down at this it appears that he is attempting to epithelialize around the edges of the wound and on the base as well. His edema is reasonably well controlled we have been using collagen with absolutely no effect 1/20; left lateral lower leg remains closed he has extremitease stockings. The area on the right medial calf I aggressively debrided last week  measures larger but the surface looks better. We have been using Hydrofera Blue. We ran Oasis through his insurance but we have not seen the results of this 1/27; left lower leg wound with chronic venous insufficiency and secondary lymphedema. I did aggressive debridement on this last week the wound seems to have come in healthy looking surface using Hydrofera Blue. He was denied for Oasis 2/3; small divot in the right medial lower leg. Under illumination the walls of this divot are epithelialized however the base has slough which I removed  with a curette we have been using Hydrofera Blue 2/10 small divot on the right medial lower leg pinpoint illumination at the base of this cone-shaped wound. We have been using Hydrofera Blue but I will switch to calcium alginate this week 2/17; the small divot on the right medial lower leg is fully epithelialized. There is no visible open area under illumination. He has his own stocking for the right leg similar to the one he has been wearing on the left. 03/26/2022; READMISSION This is a now 77 year old man that we had in the clinic from 02/23/2020 through 07/05/2020. At that point he had bilateral lower extremity wounds left greater than right in the setting of severe lymphedema. He had already obtained compression pumps ordered for him I think from the wound care clinic in [facility] so I do not really have record of what he has been using. He claims to be using them once a day but there is a problem with the sleeve on the left leg. About 2 weeks ago he was hospitalized from 03/11/2022 through 03/14/2022 with diastolic congestive heart failure. 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 He was diuresed. Developed some prerenal azotemia and he has not been taking any diuretics currently. He has not been putting stockings on his legs since he got out of hospital and still has his legs dependent for long  periods. 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 History of inguinal hernia repair in September 22, hypertension, stage IIIb chronic renal failure ABIs today were noncompressible on the right 1.12 on the left 04-02-2022 upon evaluation today patient appears to be doing well currently in regard to his legs I do feel like both areas that are draining are actually much drier than they were in the picture last week although the left is drier than the right. He is tolerating the 4-layer compression wraps at this point he did contact the pump company and they are actually working on getting him a new compression sleeve for one of his legs which have previously popped and was not functioning properly. 04-23-2022 upon evaluation today patient appears to be doing well currently in regard to his wounds on the legs. I am actually very pleased with where things stand and I do feel like that we are headed in the right direction. Fortunately there is no sign of active infection locally or systemically at this time. 05-07-2022 upon evaluation today patient appears to be doing well currently in regard to his wounds in fact things are showing signs of improvement which is good news I do not see too much that actually appears to be open and I am very pleased in that regard. No fevers, chills, nausea, vomiting, or diarrhea. 05-21-2022 upon evaluation today patient appears to be doing somewhat poorly in regard to drainage of his lower extremities bilaterally. The right is greater than Filley, Mcdaniel ([id number]) [phone].pdf Page 9 of 13 left as far as the weeping area. Nonetheless it seems to be getting worse not better. He actually has pitting edema which is at least 2+ to the thighs and I  am concerned about the fact that he is may be fluid overloaded in general and that is the reason why we cannot get this under control. I know he  is not using his pumps all the time because he actually told the nurse that he was either going to pump or he was going to use his fluid pills but not do both. For that reason I do think that he needs to be really doing both in order to get the fluid out as effectively as possible obviously with the 4-layer compression wraps were doing as much as we can from a compression standpoint but it is really not enough. He tells me that he elevates his leg is much as he can in between pumping and other activity throughout the day. 05-28-2022 upon evaluation today patient appears to be doing better in regard to his wounds although the measurements may be a little bit larger this is a very difficult wound to heal it is very indistinct in a lot of areas. Nonetheless there is can be some need for sharp debridement in regard to both medial and lateral legs. Fortunately I see no signs of active infection locally nor systemically at this time. No fevers, chills, nausea, vomiting, or diarrhea. 06-04-2022 upon evaluation today patient appears to be doing poorly in general in regard to the wounds on his legs. He still continues to have a tremendous amount of fluid not just in the lower portion of his leg but to be honest his thigh where he has 2-3+ pitting edema in the thigh as well. Unfortunately I do not know that we will be able to get this healed effectively and keep it healed on the lower extremities unless he gets the overall fluid situation taking and under control. Fortunately I do not see any signs of infection locally nor systemically which is great news. He just seems to be very fluid overloaded. 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 He has been  tolerating the dressing changes with the compression wraps without complication. Fortunately I do not see any evidence of infection at this time which is great news. No fevers, chills, nausea, vomiting, or diarrhea. 06-18-2022 upon evaluation today patient appears to be doing well currently in regard to his wounds as far as not looking like they are terribly infected but nonetheless I am concerned about a subacute infection secondary to the fact that he continues to have spreading despite the compression therapy. We actually did do an Unna boot on him last week this is actually the first wrap that actually stayed up everything else has been sliding down quite significantly. Fortunately there does not appear to be any signs of infection systemically at this time. With that being said I do believe that locally there seems to be an issue going on here and again I Ernie Hew do a PCR culture to see what that shows also think that I am going to put him on a broad-spectrum antibiotic, doxycycline to see how that will help as well. He does tell me that coming into the clinic today that he was feeling short of breath like "he was about to have a heart attack" because he was having such a hard time breathing. He says that he told this to Dr. Jodelle Green his cardiologist as well when he was evaluated in the past 1 to 2 weeks. 07-02-2022 upon evaluation today patient appears to be doing poorly currently in regard to his wound. He has been tolerating the dressing changes. Unfortunately he has not had any compression wraps on for the past week because he was unable to  make it in for his appointment last week. With that being said he has a significant amount of drainage he tells me has been using his pumps but despite this in the pumps he still has been draining quite a bit. The drainage is also somewhat purulent unfortunately. We did attempt to get in touch with his cardiologist last week unfortunately we were unable to get up  with him I did advise that the patient needs to get in touch with him upon leaving today in order to make sure they know he is on the new antibiotics I am going to send him this will be Levaquin and Augmentin. 07-09-2022 upon evaluation today patient appears to be doing about the same in regard to his legs he may have just a slight amount of improvement with regard to the drainage probably Keystone topical antibiotics are helping in this regard to some degree. Fortunately there does not appear to be any signs of active infection systemically which is great news. No fevers, chills, nausea, vomiting, or diarrhea. 07-16-2022 upon evaluation today patient appears to be doing well currently in regard to his wound. He has been tolerating the dressing changes without complication. Fortunately there does not appear to be any signs of active infection locally nor systemically at this time. With that being said he cannot keep the wraps up he tells me on the left side he had to cut this down because it got too tight. He has been using his pumps but he is on the right side the wrap actually straight down causing some pushing around the central part of his leg just below the calf I think this is a bigger risk for him that help at this point. I think that we may need to try something different he should be getting his compression socks shortly he tells me they were ordered last Thursday. 3/6; ; this is a patient who lives in [place]. He has severe bilateral lymphedema. He has compression pumps, we have been using kerlix Ace wrap Keystone. He is changing the dressing. We do not have home health. 08-06-2022 upon evaluation today patient appears to be doing a little better in regard to his wounds in general at this point. Fortunately there does not appear to be any signs of active infection locally nor systemically at this time which is great news and overall I am extremely pleased with where we stand today. 08-13-2022  upon evaluation today patient appears to actually be doing significantly better compared to last week. He actually did go to the hospital I told him that he needed to when he left here and he actually did go. With that being said they actually ended up admitting him he was having shortness of breath and I thought it might be related to congestive heart failure turns out he actually had a pulmonary embolism. Subsequently they were able to get him off of the Coumadin switching over to Eliquis to get things stabilized in that regard they also had them wrapped and got his swelling under control on his legs he actually looks much better pretty much across the board at this point. I am very pleased in that regard. With that being said I am very happy that he finally went that could have been a very dangerous situation. 08-20-2022 upon evaluation today patient appears to be doing well currently in regard to his wound. Has been tolerating the dressing changes without complication. Fortunately there does not appear to be any signs of active  infection locally nor systemically at this time. I think his legs are doing better there is some need for sharp debridement today. 08-27-2022 upon evaluation patient is actually making excellent progress. I am actually very pleased with where he stands and I think that he is moving in the right direction. In general I think that we are looking pretty good at the moment. 09-03-2022 upon evaluation today patient appears to be doing well currently in regard to his wound. He is actually tolerating dressing changes on the left and right leg without complication. Fortunately I do not see any need for debridement of the left leg the right leg I think we probably do some need to perform some debridement here. 09-10-2022 upon evaluation today patient's wounds actually showed signs of improvement in both legs I do not see much is going require debridement today which was great news.  Fortunately I do not see any evidence of infection which I think is also excellent he seems to be using his pumps and doing everything right I am happy about how this is progressing at this point. 09-17-2022 upon evaluation today patient appears to be doing decently well in regard to his wounds. He has been tolerating the dressing changes without complication. Fortunately there does not appear to be any signs of active infection at this time which is good news. 09-24-2022 upon evaluation today patient appears to be doing well currently in regard to his wounds. He has been tolerating the dressing changes without complication. Fortunately there does not appear to be any signs of active infection locally nor systemically which is great news. No fevers, chills, nausea, vomiting, or diarrhea. 10-08-2022 upon evaluation today patient appears to be doing excellent currently in regard to his wound. He has been tolerating the dressing changes without complication though it does not sound like he has been using his compression wraps for a bit here. He does think he was doing better with the [facility] topical antibiotics we can definitely go back to using that but I think the biggest issue here is that his swelling is just very out of control and needs to be under control. I discussed that with him today. 10-15-2022 upon evaluation today patient appears to be doing well currently in regard to his wounds. He is actually making some progress which is good news. Fortunately I do not see any evidence of active infection locally nor systemically which is great news as well. No fevers, chills, nausea, vomiting, or diarrhea. He does have a callused area on the plantar aspect of his left foot which is actually causing some pain and he wonders if I can trim this down for him. 10-22-2022 upon evaluation today patient seems to be making progress. He is actually doing quite well and very pleased in that regard. I do not see any signs  of active infection at this time. Adinolfi, [name] [id number] Page 10 of 13 11-05-2022 upon evaluation patient appears to be making progress although slowly towards closure. He seems to be doing well with regard to his legs were still using the [facility] topical antibiotics and he seems to be doing quite well. He is going require some sharp debridement today. 11-19-2022 upon evaluation today patient unfortunately appears to be extremely swollen at this point. He tells me that he ran out of supplies he also tells me his leg started leaking more because of not having supplies he was unable to wear his compression wraps that is the juxta fit compression wraps. Therefore  his legs are extremely swollen much larger than normal and do not appear to be doing well at all today. He is going require some debridement I also think he is going require [place] to perform compression wrapping today. 11-26-2022 upon evaluation today patient appears to be doing well currently in regard to his legs as far as infection is concerned I see nothing that appears to be infected. Fortunately I do not see any signs of active infection systemically either which is also good news. With that being said he still is extremely swollen as far as his legs are concerned. I do not see any signs of overall worsening but also do not see any signs of significant improvement which is the major issue here. 12-03-2022 upon evaluation today patient appears to be doing poorly still in regard to his legs the apparently has been taken off the wraps and not leaving the week by week. With that being said he has not had really the dressings to put on the release that is running out and subsequently though he is using his wraps I am not sure that he has been keeping them on like he needs to. I explained to him I really wanted to have the wraps that I put on him left on until he comes back to see me so that we can keep the  compression under better control. He voiced understanding today he tells me that he was "confused about that". Nonetheless based on what I am seeing right now also think that he is up on his feet too much I think he needs to get the feet elevated. 7/24; this is a patient with severe bilateral stage III lymphedema. On the right lower leg medially is a large area of macerated denuded skin was too small deeper areas in this. On the left leg he is 2 areas that are a little more standard in terms of wounds. Massive lymphedema bilaterally. He has compression pumps at home which he uses for 1 hour twice a day 12-17-2022 upon evaluation today patient's legs though a little bit smaller still continue to have significant issues with weeping here this just does not dry. I really think he needs to be changing this daily and this means we will probably have to try to go back to the Velcro wraps and give this a shot. I think may be going to the Velcro wraps, changing the dressing to a superabsorber like XtraSorb or the like, and have him elevate his legs and do his pumps 3 times per day is probably going to be the way to go to try to see if we can make some improvement here. 12-24-2022 upon evaluation today patient unfortunately is still struggling to get anything under control. We have been extremely aggressive and trying to: Control his swelling everything we do however seems to be met with resistance. He tells me that he is pumping 3 times per day with his lymphedema pumps. He also tells me that he is try to elevate his legs is much as possible and that subsequently he has been keeping his compression wraps on. He goes back and forth between saying that he can keep our wraps on and not but right now I really do not see that he is doing well with the juxta fit is at least not from the standpoint of improving the overall appearance I think it does to some degree help maintain but we are still struggling here. I have  voiced a concern  to the patient that despite everything you are doing I am still not seeing a lot of improvement here and to be honest I am not sure what is can take get this under control he may have to go into the hospital for admission. 12-31-2022 upon evaluation today patient appears to be doing well currently in regard to his wounds. They seem a little better although he has several pustules around the legs in general but has been concerned about infection. I am actually going to go ahead and see about getting an antibiotic sent into the pharmacy today although I am going to obtain a PCR culture as well. 01-07-2023 upon evaluation today patient appears to be doing well currently in regard to his wound. Has been tolerating the dressing changes without complication. He actually seems to be doing much better. He did have a PCR culture which showed Serratia as well and the prominent organisms here. Levaquin was actually a good option to treat the Serratia along with the other organisms that were problematic including E. coli. Nonetheless I think coupled with the topical Keystone antibiotics and the alginate he is really doing quite well today. 8/28; patient with severe bilateral lymphedema he has wounds bilaterally on his lower legs but they look a lot better today. We are using silver alginate and Keystone with Urgo K2 lite wraps. He comes in today with his wounds looking quite good. He was prescribed Levaquin last time I think he will complete this in a few days. 01-21-2023 upon evaluation today patient appears to be doing worse in regard to his swelling took his wrap off he tells me he believes on Saturday. With that being said his leg is significantly swollen since that time and unfortunately does not appear to be doing nearly as good as it was last time I saw him 2 weeks ago or even last week for that matter. I am very concerned about this. Objective Constitutional Obese and well-hydrated in no  acute distress. Vitals Time Taken: 4:00 PM, Height: 74 in, Weight: 250 lbs, BMI: 32.1, Temperature: 97.9 F, Pulse: 93 bpm, Respiratory Rate: 16 breaths/min, Blood Pressure: 188/86 mmHg. Respiratory normal breathing without difficulty. Psychiatric this patient is able to make decisions and demonstrates good insight into disease process. Alert and Oriented x 3. pleasant and cooperative. General Notes: Upon inspection patient's wound bed actually showed signs of poor granulation and epithelization at this time. He is extremely swollen and I am unsure why he continues to be so swollen he tells me that he is doing everything that I have recommended for him. He is using his pumps, elevating his legs, and he tells me wearing compression although again he took the compression wraps off on Saturday because it was hurting so bad. He had blisters up underneath the wraps and again he is extremely swollen I think that this is a big part of the issue that his swelling and edema is just way out of control I am unsure exactly what is going on. Integumentary (Hair, Skin) Wound #12 status is Open. Original cause of wound was Frostbite. The date acquired was: 12/17/2022. The wound has been in treatment 5 weeks. The wound is located on the Left,Anterior Lower Leg. The wound measures 0.5cm length x 0.5cm width x 0.1cm depth; 0.196cm^2 area and 0.02cm^3 volume. There is Fat Layer (Subcutaneous Tissue) exposed. There is no tunneling or undermining noted. There is a large amount of serous drainage noted. The wound margin is distinct with the outline attached  to the wound base. There is small (1-33%) red granulation within the wound bed. There is a large (67-100%) amount of necrotic tissue within the wound bed including Adherent Slough. The periwound skin appearance exhibited: Hemosiderin Staining. The periwound skin appearance did not exhibit: Callus, Crepitus, Excoriation, Induration, Rash, Scarring, Dry/Scaly,  Maceration, Atrophie Blanche, Cyanosis, Ecchymosis, Mottled, Pallor, Rubor, Funk, Boden ([id number]) [id number] Page 11 of 13 Erythema. Periwound temperature was noted as No Abnormality. Wound #13 status is Open. Original cause of wound was Gradually Appeared. The date acquired was: 12/24/2022. The wound has been in treatment 4 weeks. The wound is located on the Left,Lateral Lower Leg. The wound measures 0.3cm length x 1.4cm width x 0.1cm depth; 0.33cm^2 area and 0.033cm^3 volume. There is Fat Layer (Subcutaneous Tissue) exposed. There is no tunneling or undermining noted. There is a medium amount of serous drainage noted. The wound margin is flat and intact. There is large (67-100%) pink granulation within the wound bed. There is a small (1-33%) amount of necrotic tissue within the wound bed including Adherent Slough. The periwound skin appearance had no abnormalities noted for texture. The periwound skin appearance had no abnormalities noted for moisture. The periwound skin appearance exhibited: Hemosiderin Staining. Periwound temperature was noted as No Abnormality. Wound #6 status is Open. Original cause of wound was Gradually Appeared. The date acquired was: 03/05/2022. The wound has been in treatment 43 weeks. The wound is located on the Right,Medial Lower Leg. The wound measures 8cm length x 4.5cm width x 0.6cm depth; 28.274cm^2 area and 16.965cm^3 volume. There is Fat Layer (Subcutaneous Tissue) exposed. There is no tunneling or undermining noted. There is a large amount of serous drainage noted. The wound margin is distinct with the outline attached to the wound base. There is small (1-33%) pink, pale granulation within the wound bed. There is a large (67-100%) amount of necrotic tissue within the wound bed including Adherent Slough. The periwound skin appearance exhibited: Scarring, Hemosiderin Staining. The periwound skin appearance did not exhibit: Callus,  Crepitus, Excoriation, Induration, Rash, Dry/Scaly, Maceration, Atrophie Blanche, Cyanosis, Ecchymosis, Mottled, Pallor, Rubor, Erythema. Periwound temperature was noted as No Abnormality. Wound #7 status is Open. Original cause of wound was Gradually Appeared. The date acquired was: 03/05/2022. The wound has been in treatment 43 weeks. The wound is located on the Left,Medial Lower Leg. The wound measures 3.5cm length x 3cm width x 0.2cm depth; 8.247cm^2 area and 1.649cm^3 volume. There is Fat Layer (Subcutaneous Tissue) exposed. There is no tunneling or undermining noted. There is a large amount of serous drainage noted. The wound margin is distinct with the outline attached to the wound base. There is large (67-100%) pink granulation within the wound bed. There is a small (1-33%) amount of necrotic tissue within the wound bed including Adherent Slough. The periwound skin appearance exhibited: Scarring, Hemosiderin Staining. The periwound skin appearance did not exhibit: Callus, Crepitus, Excoriation, Induration, Rash, Dry/Scaly, Maceration, Atrophie Blanche, Cyanosis, Ecchymosis, Mottled, Pallor, Rubor, Erythema. Periwound temperature was noted as No Abnormality. 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 There was a Four Layer Compression Therapy Procedure by [name], RN. Post procedure Diagnosis  Wound #12: Same as Pre-Procedure Notes: or URGO K2. Wound #13 Pre-procedure diagnosis of Wound #13 is a Lymphedema located on the Left,Lateral Lower Leg .  There was a Four Layer Compression Therapy Procedure by [name], RN. Post procedure Diagnosis Wound #13: Same as Pre-Procedure Notes: or URGO K2. Wound #6 Pre-procedure diagnosis of Wound #6 is a Lymphedema located on the Right,Medial Lower Leg . There was a Four Layer Compression Therapy Procedure by [name], RN. Post procedure Diagnosis Wound #6: Same as Pre-Procedure Notes: or URGO K2. Wound #7 Pre-procedure diagnosis of Wound #7 is a Lymphedema located on the Left,Medial Lower Leg . There was a Four Layer Compression Therapy Procedure by [name], RN. Post procedure Diagnosis Wound #7: Same as Pre-Procedure Notes: or URGO K2. Plan Follow-up Appointments: Return Appointment in 1 week. [name] PA Wednesday ****extra time 60 minutes**** 01/28/23 at 2:45pm Return Appointment in 2 weeks. [name] PA Return appointment in 3 weeks. - Please see front desk to schedule appointment Return appointment in 1 month. - Please see front desk to schedule appointment Other: - Please ask your Primary Care Practioner or Foot Doctor to order orthotic shoes. Anesthetic: (In clinic) Topical Lidocaine 5% applied to wound bed Bathing/ Shower/ Hygiene: May shower with protection but do not get wound dressing(s) wet. Protect dressing(s) with water repellant cover (for example, large plastic bag) or a cast cover and may then take shower. Edema Control - Lymphedema / SCD / Other: Conely, Izaias ([id number]) [id number] Page 12 of 13 Lymphedema Pumps. Use Lymphedema pumps on leg(s) 2-3 times a day for 45-60 minutes. If wearing any wraps or hose, do not remove them. Continue exercising as instructed. - *****pump 3 times a day for an hour each time.***** Elevate legs to the level of the heart or above for 30 minutes daily and/or when sitting for 3-4 times a day throughout the day. - +++ Important+++ ELEVATE THE LEGS>>>>>>>>>> Avoid standing for long  periods of time. Exercise regularly Compression stocking or Garment 30-40 mm/Hg pressure to: - use juxtafits to both legs daily if compression wraps removed Other Edema Control Orders/Instructions: - When sitting please keep your feet up, keep legs at Heart Level or above. Use pillows behind calf to for comfort. Off-Loading: Other: - When sitting please keep your feet up, keep legs at Heart Level or above. Use pillows behind calf to for comfort. Additional Orders / Instructions: Follow Nutritious Diet - Try and increase protein intake. Goal of protein intake is 60g-100g. WOUND #12: - Lower Leg Wound Laterality: Left, Anterior Cleanser: Soap and Water 1 x Per Week/30 Days Discharge Instructions: May shower and wash wound with dial antibacterial soap and water prior to dressing change. Peri-Wound Care: Sween Lotion (Moisturizing lotion) 1 x Per Week/30 Days Discharge Instructions: Apply moisturizing lotion as directed Topical: Keystone antibiotic spray 1 x Per Week/30 Days Discharge Instructions: ON HOLD 01/21/23 Prim Dressing: Maxorb Extra Ag+ Alginate Dressing, 4x4.75 (in/in) 1 x Per Week/30 Days ary Discharge Instructions: Apply to wound bed as instructed Secondary Dressing: ABD Pad, 8x10 1 x Per Week/30 Days Discharge Instructions: Apply over primary dressing as directed. Secondary Dressing: OptiLock Super Absorbent, 5x5.5 (in/in) (Generic) 1 x Per Week/30 Days Discharge Instructions: Apply directly to wound bed as directed Com pression Wrap: Urgo K2, (equivalent to a 4 layer) two layer compression system, regular 1 x Per Week/30 Days Discharge Instructions: Apply Urgo K2 as directed (alternative to 4 layer compression). WOUND #13: - Lower Leg Wound Laterality:  Left, Lateral Cleanser: Soap and Water 1 x Per Week/30 Days Discharge Instructions: May shower and wash wound with dial antibacterial soap and water prior to dressing change. Peri-Wound Care: Sween Lotion (Moisturizing lotion) 1 x  Per Week/30 Days Discharge Instructions: Apply moisturizing lotion as directed Topical: Keystone antibiotic spray 1 x Per Week/30 Days Discharge Instructions: ON HOLD 01/21/23 Prim Dressing: Maxorb Extra Ag+ Alginate Dressing, 4x4.75 (in/in) 1 x Per Week/30 Days ary Discharge Instructions: Apply to wound bed as instructed Secondary Dressing: ABD Pad, 8x10 1 x Per Week/30 Days Discharge Instructions: Apply over primary dressing as directed. Secondary Dressing: OptiLock Super Absorbent, 5x5.5 (in/in) (Generic) 1 x Per Week/30 Days Discharge Instructions: Apply directly to wound bed as directed (if available) Com pression Wrap: Urgo K2, (equivalent to a 4 layer) two layer compression system, regular 1 x Per Week/30 Days Discharge Instructions: Apply Urgo K2 as directed (alternative to 4 layer compression). WOUND #6: - Lower Leg Wound Laterality: Right, Medial Cleanser: Soap and Water 1 x Per Week/30 Days Discharge Instructions: May shower and wash wound with dial antibacterial soap and water prior to dressing change. Peri-Wound Care: Sween Lotion (Moisturizing lotion) 1 x Per Week/30 Days Discharge Instructions: Apply moisturizing lotion as directed Topical: Keystone antibiotic spray 1 x Per Week/30 Days Prim Dressing: Maxorb Extra Ag+ Alginate Dressing, 4x4.75 (in/in) 1 x Per Week/30 Days ary Discharge Instructions: Apply to wound bed as instructed Secondary Dressing: ABD Pad, 8x10 1 x Per Week/30 Days Discharge Instructions: Apply over primary dressing as directed. Secondary Dressing: OptiLock Super Absorbent, 5x5.5 (in/in) (Generic) 1 x Per Week/30 Days Discharge Instructions: Apply directly to wound bed as directed (if available) Com pression Wrap: Urgo K2, (equivalent to a 4 layer) two layer compression system, regular 1 x Per Week/30 Days Discharge Instructions: Apply Urgo K2 as directed (alternative to 4 layer compression). WOUND #7: - Lower Leg Wound Laterality: Left, Medial Cleanser:  Soap and Water 1 x Per Week/30 Days Discharge Instructions: May shower and wash wound with dial antibacterial soap and water prior to dressing change. Peri-Wound Care: Sween Lotion (Moisturizing lotion) 1 x Per Week/30 Days Discharge Instructions: Apply moisturizing lotion as directed Topical: Keystone antibiotic spray 1 x Per Week/30 Days Discharge Instructions: on hold (01/21/23) Prim Dressing: Maxorb Extra Ag+ Alginate Dressing, 4x4.75 (in/in) 1 x Per Week/30 Days ary Discharge Instructions: Apply to wound bed as instructed Secondary Dressing: ABD Pad, 8x10 1 x Per Week/30 Days Discharge Instructions: Apply over primary dressing as directed. (If available) Secondary Dressing: OptiLock Super Absorbent, 5x5.5 (in/in) (Generic) 1 x Per Week/30 Days Discharge Instructions: Apply directly to wound bed as directed Com pression Wrap: Urgo K2, (equivalent to a 4 layer) two layer compression system, regular 1 x Per Week/30 Days Discharge Instructions: Apply Urgo K2 as directed (alternative to 4 layer compression). 1. Based on what I am seeing I do believe that the patient unfortunately is still extremely swollen and again this looks to be much worse than it did last week. Fortunately I do not see any signs of worsening with regard to the overall appearance of the wounds per se but the swelling and leakage is much worse than when I saw him 2 weeks ago and even what seems to be Dr. Leanord Hawking saw him 1 week ago. Either way I feel like that we have to get this under control. Way to get the compression wrap back on him. 2. I explained to the patient we will use a silver alginate none of the [place].  3. I am going to recommend that if the wrap gets tight and painful like it was before that he should go to the ER for further evaluation and see what they can do to try to help figure out what is going on with the fluid and get this pulled off which I think is the only way that he is getting get better. We will  see patient back for reevaluation in 1 week here in the clinic. If anything worsens or changes patient will contact our office for additional recommendations. Electronic Signature(s) Signed: 01/21/2023 5:56:32 PM By: [name] PA-C Entered By: [name] on 01/21/2023 14:56:32 Musil, [name] ([id number]) [id number] Page 13 of 13 -------------------------------------------------------------------------------- SuperBill Details Patient Name: Date of Service: Higashi, [name] 01/21/2023 Medical Record Number: [id number] Patient Account Number: [id number] Date of Birth/Sex: Treating RN: May 10, 1946 (77 y.o. 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

## 2023-01-22 NOTE — Progress Notes (Signed)
[name], [name] ([id number]) [id number] Page 1 of 12 Visit Report for 01/21/2023 Arrival Information Details Patient Name: Date of Service: [name], [name] 01/21/2023 3:00 PM Medical Record Number: [id number] Patient Account Number: [id number] Date of Birth/Sex: Treating RN: 03/26/1946 (77 y.o. [name] Primary Care Clayborn Milnes: [name] Other Clinician: Referring Itali Mckendry: Treating Yissel Habermehl/Extender: [name] in Treatment: 73 Visit Information History Since Last Visit Added or deleted any medications: No Patient Arrived: [name] Any new allergies or adverse reactions: No Arrival Time: 15:45 Had a fall or experienced change in No Accompanied By: self activities of daily living that may affect Transfer Assistance: None risk of falls: Patient Identification Verified: Yes Signs or symptoms of abuse/neglect since last visito No Patient Requires Transmission-Based Precautions: No Hospitalized since last visit: No Patient Has Alerts: Yes Implantable device outside of the clinic excluding No Patient Alerts: Patient on Blood Thinner cellular tissue based products placed in the center Right ABI in clinic [place] since last visit: Has Dressing in Place as Prescribed: Yes Has Compression in Place as Prescribed: No Pain Present Now: Yes Electronic Signature(s) Signed: 01/21/2023 5:38:10 PM By: [name] RN Entered By: [name] on 01/21/2023 13:07:40 -------------------------------------------------------------------------------- Compression Therapy Details Patient Name: Date of Service: [name], [name] 01/21/2023 3:00 PM Medical Record Number: [id number] Patient Account Number: [id number] Date of Birth/Sex: Treating RN: 1945/11/30 (77 y.o. [name] Primary Care Imajean Mcdermid: [name] Other Clinician: Referring Waneda Klammer: Treating Lizeth Bencosme/Extender: [name] Weeks in Treatment:  15 Compression Therapy Performed for Wound Assessment: Wound #12 Left,Anterior Lower [name], RN Compression Type: Four Layer Post Procedure Diagnosis Same as Pre-procedure Notes or URGO K2 Electronic Signature(s) Signed: 01/21/2023 5:38:10 PM By: [name] RN Entered By: [name] on 01/21/2023 13:30:37 Derego, [name] ([id number]) [id number] Page 2 of 12 -------------------------------------------------------------------------------- Compression Therapy Details Patient Name: Date of Service: [name], [name] 01/21/2023 3:00 PM Medical Record Number: [id number] Patient Account Number: [id number] Date of Birth/Sex: Treating RN: February 22, 1946 (77 y.o. [name] Primary Care Emya Picado: [name] Other Clinician: Referring Zaelyn Barbary: Treating Cherylin Waguespack/Extender: [name] Weeks in Treatment: 45 Compression Therapy Performed for Wound Assessment: Wound #13 Left,Lateral Lower [name], RN Compression Type: Four Layer Post Procedure Diagnosis Same as Pre-procedure Notes or URGO K2 Electronic Signature(s) Signed: 01/21/2023 5:38:10 PM By: [name] RN Entered By: [name] on 01/21/2023 13:30:38 -------------------------------------------------------------------------------- Compression Therapy Details Patient Name: Date of Service: [name], [name] 01/21/2023 3:00 PM Medical Record Number: [id number] Patient Account Number: [id number] Date of Birth/Sex: Treating RN: 11-14-1945 (77 y.o. [name] Primary Care Sriram Febles: [name] Other Clinician: Referring Yoana Staib: Treating Priya Matsen/Extender: [name] Weeks in Treatment: 46 Compression Therapy Performed for Wound Assessment: Wound #6 Right,Medial Lower [name], RN Compression Type: Four Layer Post Procedure  Diagnosis Same as Pre-procedure Notes or URGO K2 Electronic Signature(s) Signed: 01/21/2023 5:38:10 PM By: [name] RN Entered By: [name] on 01/21/2023 13:30:38 -------------------------------------------------------------------------------- Compression Therapy Details Patient Name: Date of Service: [name], [name] 01/21/2023 3:00 PM Medical Record Number: [id number] Patient Account Number: [id number] Date of Birth/Sex: Treating RN: Oct 22, 1945 (77 y.o. [name] Primary Care Milaina Sher: [name] Other Clinician: Referring Krimson Massmann: Treating Karlin Binion/Extender: [name] Schulte, [name] ([id number]) [id number] Page 3 of 12 Weeks in Treatment: 43 Compression Therapy Performed for Wound Assessment:  Wound #7 Left,Medial Lower [name], RN Compression Type: Four Layer Post Procedure Diagnosis Same as Pre-procedure Notes or URGO K2 Electronic Signature(s) Signed: 01/21/2023 5:38:10 PM By: [name] RN Entered By: [name] on 01/21/2023 13:30:38 -------------------------------------------------------------------------------- Encounter Discharge Information Details Patient Name: Date of Service: [name], [name] 01/21/2023 3:00 PM Medical Record Number: [id number] Patient Account Number: [id number] Date of Birth/Sex: Treating RN: [birth year] (77 y.o. [name] Primary Care Suhey Radford: [name] Other Clinician: Referring Adama Ferber: Treating Chelcee Korpi/Extender: [name] Weeks in Treatment: 28 Encounter Discharge Information Items Discharge Condition: Stable Ambulatory Status: Cane Discharge Destination: Home Transportation: Private Auto Accompanied By: self Schedule Follow-up Appointment: Yes Clinical Summary of Care: Patient Declined Electronic Signature(s) Signed: 01/21/2023 5:38:10 PM By: [name] RN Entered By:  [name] on 01/21/2023 14:23:49 -------------------------------------------------------------------------------- Lower Extremity Assessment Details Patient Name: Date of Service: [name], [name] 01/21/2023 3:00 PM Medical Record Number: [id number] Patient Account Number: [id number] Date of Birth/Sex: Treating RN: [birth year] (77 y.o. [name] Primary Care Fernande Treiber: [name] Other Clinician: Referring Roshawn Ayala: Treating Alanson Hausmann/Extender: [name] Weeks in Treatment: 6 Edema Assessment Assessed: [Left: No] [Right: No] Edema: [Left: Yes] [Right: Yes] Calf Left: Right: Point of Measurement: 40 cm From Medial Instep 55.1 cm 65 cm Ankle Marsala, Joby ([id number]) [id number] Page 4 of 12 Left: Right: Point of Measurement: 13 cm From Medial Instep 50 cm 44 cm Vascular Assessment Pulses: Dorsalis Pedis Palpable: [Left:Yes] [Right:Yes] Extremity colors, hair growth, and conditions: Extremity Color: [Left:Hyperpigmented] [Right:Hyperpigmented] Hair Growth on Extremity: [Left:No] Temperature of Extremity: [Left:Warm] [Right:Warm] Capillary Refill: [Left:> 3 seconds] [Right:> 3 seconds] Dependent Rubor: [Left:No Yes] [Right:No Yes] Electronic Signature(s) Signed: 01/21/2023 5:38:10 PM By: [name] RN Entered By: [name] on 01/21/2023 13:08:47 -------------------------------------------------------------------------------- Multi-Disciplinary Care Plan Details Patient Name: Date of Service: [name], [name] 01/21/2023 3:00 PM Medical Record Number: [id number] Patient Account Number: [id number] Date of Birth/Sex: Treating RN: 05-28-1945 (77 y.o. [name] Primary Care Eduarda Scrivens: [name] Other Clinician: Referring Chelci Wintermute: Treating Charmeka Freeburg/Extender: [name] in Treatment: 24 Multidisciplinary Care Plan reviewed with physician Active Inactive Wound/Skin  Impairment Nursing Diagnoses: Impaired tissue integrity Knowledge deficit related to ulceration/compromised skin integrity Goals: Patient will have a decrease in wound volume by X% from date: (specify in notes) Date Initiated: 03/26/2022 Target Resolution Date: 04/17/2025 Goal Status: Active Patient/caregiver will verbalize understanding of skin care regimen Date Initiated: 03/26/2022 Target Resolution Date: 04/17/2025 Goal Status: Active Ulcer/skin breakdown will have a volume reduction of 30% by week 4 Date Initiated: 03/26/2022 Date Inactivated: 05/21/2022 Target Resolution Date: 05/17/2022 Unmet Reason: see wound Goal Status: Unmet measurement. Ulcer/skin breakdown will have a volume reduction of 50% by week 8 Date Initiated: 03/26/2022 Date Inactivated: 05/21/2022 Target Resolution Date: 05/17/2022 Unmet Reason: see wound Goal Status: Unmet measurement. Interventions: Assess patient/caregiver ability to obtain necessary supplies Assess patient/caregiver ability to perform ulcer/skin care regimen upon admission and as needed Assess ulceration(s) every visit Notes: Patient stated today, "I will take my fluid pill or pump not do both." Cuahutemoc Attar made aware. Electronic Signature(s) Signed: 01/21/2023 5:38:10 PM By: [name] RN Rosch, [name] ([id number]) PM By: [name] RN [phone].pdf Page 5 of 12 Signed: 01/21/2023 5:38:10 Entered By: [name] on 01/21/2023 13:29:23 -------------------------------------------------------------------------------- Pain Assessment Details Patient Name: Date of Service: [name], [name] 01/21/2023 3:00 PM Medical Record Number: [id number] Patient Account Number: [id number] Date of Birth/Sex: Treating RN:  September 10, 1945 (77 y.o. [name] Primary Care Jazmina Muhlenkamp: [name] Other Clinician: Referring Juanda Luba: Treating Faruq Rosenberger/Extender: [name] Weeks in Treatment: 31 Active  Problems Location of Pain Severity and Description of Pain Patient Has Paino Yes Site Locations Pain Location: Generalized Pain With Dressing Change: Yes Duration of the Pain. Constant / Intermittento Constant Rate the pain. Current Pain Level: 8 Worst Pain Level: 10 Least Pain Level: 5 Tolerable Pain Level: 3 Character of Pain Describe the Pain: Difficult to Pinpoint Pain Management and Medication Current Pain Management: Medication: Yes Cold Application: No Rest: Yes Massage: No Activity: No T.E.N.S.: No Heat Application: No Leg drop or elevation: No Is the Current Pain Management Adequate: Adequate How does your wound impact your activities of daily livingo Sleep: No Bathing: No Appetite: No Relationship With Others: No Bladder Continence: No Emotions: No Bowel Continence: No Work: No Toileting: No Drive: No Dressing: No Hobbies: No Electronic Signature(s) Signed: 01/21/2023 5:38:10 PM By: [name] RN Entered By: [name] on 01/21/2023 13:08:42 Haq, [name] ([id number]) [id number] Page 6 of 12 -------------------------------------------------------------------------------- Patient/Caregiver Education Details Patient Name: Date of Service: [name], [name] 9/4/2024andnbsp3:00 PM Medical Record Number: [id number] Patient Account Number: [id number] Date of Birth/Gender: Treating RN: [birth year] (77 y.o. [name] Primary Care Physician: [name] Other Clinician: Referring Physician: Treating Physician/Extender: [name] in Treatment: 24 Education Assessment Education Provided To: Patient Education Topics Provided Wound/Skin Impairment: Methods: Explain/Verbal Responses: State content correctly Electronic Signature(s) Signed: 01/21/2023 5:38:10 PM By: [name] RN Entered By: [name] on 01/21/2023  13:29:44 -------------------------------------------------------------------------------- Wound Assessment Details Patient Name: Date of Service: [name], [name] 01/21/2023 3:00 PM Medical Record Number: [id number] Patient Account Number: [id number] Date of Birth/Sex: Treating RN: 1945-06-15 (77 y.o. M) Primary Care Nicholes Hibler: [name] Other Clinician: Referring Deeana Atwater: Treating Redonna Wilbert/Extender: Leveda Anna, [name] Weeks in Treatment: 37 Wound Status Wound Number: 12 Primary Lymphedema Etiology: Wound Location: Left, Anterior Lower Leg Wound Open Wounding Event: Frostbite Status: Date Acquired: 12/17/2022 Comorbid Anemia, Lymphedema, Deep Vein Thrombosis, Hypertension, Weeks Of Treatment: 5 History: Received Radiation Clustered Wound: No Photos Wound Measurements Length: (cm) 0.5 Width: (cm) 0.5 Depth: (cm) 0.1 Area: (cm) 0.196 Volume: (cm) 0.02 % Reduction in Area: 94.5% % Reduction in Volume: 97.2% Epithelialization: Medium (34-66%) Tunneling: No Undermining: No Wound Description Calzada, Finnian ([id number]) Classification: Full Thickness Without Exposed Support Structures Wound Margin: Distinct, outline attached Exudate Amount: Large Exudate Type: Serous Exudate Color: amber [phone].pdf Page 7 of 12 Foul Odor After Cleansing: No Slough/Fibrino Yes Wound Bed Granulation Amount: Small (1-33%) Exposed Structure Granulation Quality: Red Fascia Exposed: No Necrotic Amount: Large (67-100%) Fat Layer (Subcutaneous Tissue) Exposed: Yes Necrotic Quality: Adherent Slough Tendon Exposed: No Muscle Exposed: No Joint Exposed: No Bone Exposed: No Periwound Skin Texture Texture Color No Abnormalities Noted: No No Abnormalities Noted: No Callus: No Atrophie Blanche: No Crepitus: No Cyanosis: No Excoriation: No Ecchymosis: No Induration: No Erythema: No Rash: No Hemosiderin Staining: Yes Scarring: No Mottled:  No Pallor: No Moisture Rubor: No No Abnormalities Noted: No Dry / Scaly: No Temperature / Pain Maceration: No Temperature: No Abnormality Treatment Notes Wound #12 (Lower Leg) Wound Laterality: Left, Anterior Cleanser Soap and Water Discharge Instruction: May shower and wash wound with dial antibacterial soap and water prior to dressing change. Peri-Wound Care Sween Lotion (Moisturizing lotion) Discharge Instruction: Apply moisturizing lotion as directed Topical Keystone antibiotic spray Discharge Instruction: ON HOLD 01/21/23 Primary Dressing  Maxorb Extra Ag+ Alginate Dressing, 4x4.75 (in/in) Discharge Instruction: Apply to wound bed as instructed Secondary Dressing ABD Pad, 8x10 Discharge Instruction: Apply over primary dressing as directed. OptiLock Super Absorbent, 5x5.5 (in/in) Discharge Instruction: Apply directly to wound bed as directed Secured With Compression Wrap Urgo K2, (equivalent to a 4 layer) two layer compression system, regular Discharge Instruction: Apply Urgo K2 as directed (alternative to 4 layer compression). Compression Stockings Add-Ons Electronic Signature(s) Signed: 01/21/2023 5:38:10 PM By: [name] RN Entered By: [name] on 01/21/2023 13:03:31 Plotz, [name] ([id number]) [id number] Page 8 of 12 -------------------------------------------------------------------------------- Wound Assessment Details Patient Name: Date of Service: [name], [name] 01/21/2023 3:00 PM Medical Record Number: [id number] Patient Account Number: [id number] Date of Birth/Sex: Treating RN: 1946-02-08 (77 y.o. M) Primary Care Disha Cottam: [name] Other Clinician: Referring Naasir Carreira: Treating Dariah Mcsorley/Extender: [name] Weeks in Treatment: 28 Wound Status Wound Number: 13 Primary Lymphedema Etiology: Wound Location: Left, Lateral Lower Leg Wound Open Wounding Event: Gradually Appeared Status: Date  Acquired: 12/24/2022 Comorbid Anemia, Lymphedema, Deep Vein Thrombosis, Hypertension, Weeks Of Treatment: 4 History: Received Radiation Clustered Wound: No Photos Wound Measurements Length: (cm) 0. Width: (cm) 1. Depth: (cm) 0. Area: (cm) 0 Volume: (cm) 0 3 % Reduction in Area: 72.7% 4 % Reduction in Volume: 72.7% 1 Epithelialization: Medium (34-66%) .33 Tunneling: No .033 Undermining: No Wound Description Classification: Full Thickness Without Exposed Suppor Wound Margin: Flat and Intact Exudate Amount: Medium Exudate Type: Serous Exudate Color: amber t Structures Foul Odor After Cleansing: No Slough/Fibrino Yes Wound Bed Granulation Amount: Large (67-100%) Exposed Structure Granulation Quality: Pink Fascia Exposed: No Necrotic Amount: Small (1-33%) Fat Layer (Subcutaneous Tissue) Exposed: Yes Necrotic Quality: Adherent Slough Tendon Exposed: No Muscle Exposed: No Joint Exposed: No Bone Exposed: No Periwound Skin Texture Texture Color No Abnormalities Noted: Yes No Abnormalities Noted: No Hemosiderin Staining: Yes Moisture No Abnormalities Noted: Yes Temperature / Pain Temperature: No Abnormality Treatment Notes Wound #13 (Lower Leg) Wound Laterality: Left, Lateral Cleanser Soap and Water Maute, [name] ([id number]) [id number] Page 9 of 12 Discharge Instruction: May shower and wash wound with dial antibacterial soap and water prior to dressing change. Peri-Wound Care Sween Lotion (Moisturizing lotion) Discharge Instruction: Apply moisturizing lotion as directed Topical Keystone antibiotic spray Discharge Instruction: ON HOLD 01/21/23 Primary Dressing Maxorb Extra Ag+ Alginate Dressing, 4x4.75 (in/in) Discharge Instruction: Apply to wound bed as instructed Secondary Dressing ABD Pad, 8x10 Discharge Instruction: Apply over primary dressing as directed. OptiLock Super Absorbent, 5x5.5 (in/in) Discharge Instruction: Apply directly to  wound bed as directed (if available) Secured With Compression Wrap Urgo K2, (equivalent to a 4 layer) two layer compression system, regular Discharge Instruction: Apply Urgo K2 as directed (alternative to 4 layer compression). Compression Stockings Add-Ons Electronic Signature(s) Signed: 01/21/2023 5:38:10 PM By: [name] RN Entered By: [name] on 01/21/2023 13:03:46 -------------------------------------------------------------------------------- Wound Assessment Details Patient Name: Date of Service: Baze, [name] 01/21/2023 3:00 PM Medical Record Number: [id number] Patient Account Number: [id number] Date of Birth/Sex: Treating RN: April 28, 1946 (77 y.o. M) Primary Care Sante Biedermann: [name] Other Clinician: Referring Saleah Rishel: Treating Huxley Vanwagoner/Extender: [name] Weeks in Treatment: 67 Wound Status Wound Number: 6 Primary Lymphedema Etiology: Wound Location: Right, Medial Lower Leg Wound Open Wounding Event: Gradually Appeared Status: Date Acquired: 03/05/2022 Comorbid Anemia, Lymphedema, Deep Vein Thrombosis, Hypertension, Weeks Of Treatment: 43 History: Received Radiation Clustered Wound: Yes Photos Wound Measurements Hehr, Jhovanny ([id number]) Length: (cm) 8 Width: (cm) 4.5 Depth: (cm) 0.6 Clustered Quantity:  1 Area: (cm) 28.274 Volume: (cm) 16.965 [id number] Page 10 of 12 % Reduction in Area: 80.2% % Reduction in Volume: -18.7% Epithelialization: Small (1-33%) Tunneling: No Undermining: No Wound Description Classification: Full Thickness Without Exposed Sup Wound Margin: Distinct, outline attached Exudate Amount: Large Exudate Type: Serous Exudate Color: amber port Structures Foul Odor After Cleansing: No Slough/Fibrino Yes Wound Bed Granulation Amount: Small (1-33%) Exposed Structure Granulation Quality: Pink, Pale Fascia Exposed: No Necrotic Amount: Large (67-100%) Fat Layer  (Subcutaneous Tissue) Exposed: Yes Necrotic Quality: Adherent Slough Tendon Exposed: No Muscle Exposed: No Joint Exposed: No Bone Exposed: No Periwound Skin Texture Texture Color No Abnormalities Noted: No No Abnormalities Noted: No Callus: No Atrophie Blanche: No Crepitus: No Cyanosis: No Excoriation: No Ecchymosis: No Induration: No Erythema: No Rash: No Hemosiderin Staining: Yes Scarring: Yes Mottled: No Pallor: No Moisture Rubor: No No Abnormalities Noted: No Dry / Scaly: No Temperature / Pain Maceration: No Temperature: No Abnormality Treatment Notes Wound #6 (Lower Leg) Wound Laterality: Right, Medial Cleanser Soap and Water Discharge Instruction: May shower and wash wound with dial antibacterial soap and water prior to dressing change. Peri-Wound Care Sween Lotion (Moisturizing lotion) Discharge Instruction: Apply moisturizing lotion as directed Topical Keystone antibiotic spray Primary Dressing Maxorb Extra Ag+ Alginate Dressing, 4x4.75 (in/in) Discharge Instruction: Apply to wound bed as instructed Secondary Dressing ABD Pad, 8x10 Discharge Instruction: Apply over primary dressing as directed. OptiLock Super Absorbent, 5x5.5 (in/in) Discharge Instruction: Apply directly to wound bed as directed (if available) Secured With Compression Wrap Urgo K2, (equivalent to a 4 layer) two layer compression system, regular Discharge Instruction: Apply Urgo K2 as directed (alternative to 4 layer compression). Compression Stockings Add-Ons Electronic Signature(s) Signed: 01/21/2023 5:38:10 PM By: [name] RN Wieseler, [name] ([id number]) [phone].pdf Page 11 of 12 Entered By: [name] on 01/21/2023 13:04:22 -------------------------------------------------------------------------------- Wound Assessment Details Patient Name: Date of Service: Reinhold, [name] 01/21/2023 3:00 PM Medical Record Number: [id number] Patient Account  Number: [id number] Date of Birth/Sex: Treating RN: 1945/07/05 (77 y.o. M) Primary Care Yuleimy Kretz: [name] Other Clinician: Referring Aedon Deason: Treating Lalani Winkles/Extender: Leveda Anna, [name] Weeks in Treatment: 28 Wound Status Wound Number: 7 Primary Lymphedema Etiology: Wound Location: Left, Medial Lower Leg Wound Open Wounding Event: Gradually Appeared Status: Date Acquired: 03/05/2022 Comorbid Anemia, Lymphedema, Deep Vein Thrombosis, Hypertension, Weeks Of Treatment: 43 History: Received Radiation Clustered Wound: Yes Photos Wound Measurements Length: (cm) Width: (cm) Depth: (cm) Clustered Quantity: Area: (cm) Volume: (cm) 3.5 % Reduction in Area: 82.5% 3 % Reduction in Volume: 65% 0.2 Epithelialization: Small (1-33%) 1 Tunneling: No 8.247 Undermining: No 1.649 Wound Description Classification: Full Thickness Without Exposed Sup Wound Margin: Distinct, outline attached Exudate Amount: Large Exudate Type: Serous Exudate Color: amber port Structures Foul Odor After Cleansing: No Slough/Fibrino Yes Wound Bed Granulation Amount: Large (67-100%) Exposed Structure Granulation Quality: Pink Fascia Exposed: No Necrotic Amount: Small (1-33%) Fat Layer (Subcutaneous Tissue) Exposed: Yes Necrotic Quality: Adherent Slough Tendon Exposed: No Muscle Exposed: No Joint Exposed: No Bone Exposed: No Periwound Skin Texture Texture Color No Abnormalities Noted: No No Abnormalities Noted: No Callus: No Atrophie Blanche: No Crepitus: No Cyanosis: No Excoriation: No Ecchymosis: No Induration: No Erythema: No Rash: No Hemosiderin Staining: Yes Scarring: Yes Mottled: No Pallor: No Onofre, Travonne ([id number]) [id number] Page 12 of 12 Pallor: No Moisture Rubor: No No Abnormalities Noted: No Dry / Scaly: No Temperature / Pain Maceration: No Temperature: No Abnormality Treatment Notes Wound #7 (Lower Leg) Wound  Laterality: Left, Medial Cleanser Soap  and Water Discharge Instruction: May shower and wash wound with dial antibacterial soap and water prior to dressing change. Peri-Wound Care Sween Lotion (Moisturizing lotion) Discharge Instruction: Apply moisturizing lotion as directed Topical Keystone antibiotic spray Discharge Instruction: on hold (01/21/23) Primary Dressing Maxorb Extra Ag+ Alginate Dressing, 4x4.75 (in/in) Discharge Instruction: Apply to wound bed as instructed Secondary Dressing ABD Pad, 8x10 Discharge Instruction: Apply over primary dressing as directed. (If available) OptiLock Super Absorbent, 5x5.5 (in/in) Discharge Instruction: Apply directly to wound bed as directed Secured With Compression Wrap Urgo K2, (equivalent to a 4 layer) two layer compression system, regular Discharge Instruction: Apply Urgo K2 as directed (alternative to 4 layer compression). Compression Stockings Add-Ons Electronic Signature(s) Signed: 01/21/2023 5:38:10 PM By: [name] RN Entered By: [name] on 01/21/2023 13:04:43 -------------------------------------------------------------------------------- Vitals Details Patient Name: Date of Service: Charter, [name] 01/21/2023 3:00 PM Medical Record Number: [id number] Patient Account Number: [id number] Date of Birth/Sex: Treating RN: 1945-08-28 (77 y.o. [name] Primary Care Dillinger Aston: [name] Other Clinician: Referring Haizley Cannella: Treating Erol Flanagin/Extender: [name] Weeks in Treatment: 76 Vital Signs Time Taken: 16:00 Temperature (F): 97.9 Height (in): 74 Pulse (bpm): 93 Weight (lbs): 250 Respiratory Rate (breaths/min): 16 Body Mass Index (BMI): 32.1 Blood Pressure (mmHg): 188/86 Reference Range: 80 - 120 mg / dl Electronic Signature(s) Signed: 01/21/2023 5:38:10 PM By: [name] RN Entered By: [name] on 01/21/2023 13:24:36

## 2023-01-28 ENCOUNTER — Encounter (HOSPITAL_BASED_OUTPATIENT_CLINIC_OR_DEPARTMENT_OTHER): Payer: Medicare Other | Admitting: Physician Assistant

## 2023-01-28 DIAGNOSIS — I87333 Chronic venous hypertension (idiopathic) with ulcer and inflammation of bilateral lower extremity: Secondary | ICD-10-CM | POA: Diagnosis not present

## 2023-01-28 NOTE — Progress Notes (Signed)
Rail, [name] [name] ([id number]) [id number] Page 1 of 14 Visit Report for 01/28/2023 Chief Complaint Document Details Patient Name: Date of Service: [name] [name] 01/28/2023 2:45 PM Medical Record Number: [id number] Patient Account Number: [id number] Date of Birth/Sex: Treating RN: January 25, 1946 (77 y.o. M) Primary Care Provider: [name] Other Clinician: Referring Provider: Treating Provider/Extender: [name] Weeks in Treatment: 74 Information Obtained from: Patient Chief Complaint 02/23/2020; patient is here for wounds on his bilateral lower legs in the setting of severe lymphedema 03/26/2022; patient is here for wounds on his bilateral lower legs medial aspect Electronic Signature(s) Signed: 01/28/2023 2:35:02 PM By: [name] PA-C Entered By: [name] on 01/28/2023 14:35:02 -------------------------------------------------------------------------------- HPI Details Patient Name: Date of Service: [name] [name] [name] 01/28/2023 2:45 PM Medical Record Number: [id number] Patient Account Number: [id number] Date of Birth/Sex: Treating RN: [birth year] (77 y.o. M) Primary Care Provider: [name] Other Clinician: Referring Provider: Treating Provider/Extender: [name] Weeks in Treatment: 48 History of Present Illness HPI Description: ADMISSION 02/23/2020 Patient is a 77 year old man who lives in [place] who arrives accompanied by his wife. He has a history of chronic lymphedema and venous insufficiency in his bilateral lower legs which may have something to do that with having a history of DVT as well as being treated for prostate cancer. In any case he recently got compression pumps at home but compliance has been an issue here. He has compression stockings however they are probably not sufficient enough to control swelling. They tell us that things deteriorated for him in late August he was admitted  to [facility] for 7 days. This was with cellulitis I think of his bilateral lower legs. Discharge he was noted to have wounds on his bilateral lower legs. He was discharged on Bactrim. They tried to get him home health through [facility] of course they declined him. His wife is been wrapping these applying some form of silver foam dressing. He has a history of wounds before although nothing that would not heal with basic home topical dressings. He has 2 areas on the left medial, left anterior and left lateral and a smaller area on the right medial. All of these have considerable depth. Past medical history includes iron deficiency anemia, lymphedema followed by the rehab center at [facility] with lymphedema wraps I believe, DVT on chronic anticoagulation, prostate cancer, chronic venous insufficiency, hypertension. As mentioned he has compression pumps but does not use them. ABIs in our clinic were noncompressible bilaterally 10/14; patient with severe bilateral lymphedema right greater than left. He came in with bilateral lower extremity wounds left greater than right. Even though the right side has more of the edema most of the wounds here almost closed on the right medial. He has 3 remaining wounds on the left We have been using silver alginate under 4-layer compression I have been trying to get him to be compliant with his external compression pumps 10/21; patient with 3 small wounds on the left leg and 1 on the right medial in the setting of severe lymphedema and chronic venous insufficiency. We have been using silver alginate under 4-layer compression he is using his external compression pumps twice a day 11/4; ARTERIAL STUDIES on the right show an ABI of 1.02 TBI of 0.858 with biphasic waveforms on the left 0.98 with a TBI of 0.55 and biphasic waveforms. Does not look like he has significant arterial disease. We are treating him for  lymphedema he has compression pumps. He  has punched-out areas on the left anterior left lateral and right medial lower extremities 11/11; after we obtained his arterial studies I put him in 4 layer compression. He is using his compression pumps probably once a day although I have asked [name], [name] [name] ([id number]) [id number] Page 2 of 14 him to do twice. Primary dressing to the wound is silver collagen he has severe lymphedema likely secondary to chronic venous insufficiency. Wounds on the left lateral, left medial and left anterior and a small area on the right medial 12/2; the area on the right anterior lower leg has healed. We initially thought that the area medially had healed as well however when her discharge nurse came in she detected fluid in the wound simply opened up. This is actually worse than I remember this pain. The area on the left lateral potentially slightly smaller He is also complaining about pain in his left hand he says that this is actually been getting some better he has been using topical creams on this. She asked that I look at this 12/9 after last weeks issues we have 2 wounds one on the right medial lower leg and 1 on the left lateral. Both of these are in the same condition. I think because of thickened skin secondary to chronic lymphedema these wounds actually have depth of almost 0.8 cm. 12/16; the patient has 2 small but deep wounds one on the right medial and one on the left lateral. The right medial is actually the worst of these. He arrives in clinic today with absolutely terrible edema in the right leg apparently his 4-layer wrap fell down to just above his ankle he did not think about this he is apparently been continuing to use his compression pump twice a day. The left leg looks a lot better. 05/09/2020 upon evaluation today patient appears to be doing decently well in regard to his wounds. Everything is measuring smaller the right leg still has a little bit deeper wound in  the left seems to be almost completely healed in my opinion I am very pleased in general with how things are progressing. He has a 4- layer compression wrap we have been using endoform today we will probably have to use collagen just based on the fact that we do not have endoform it is on order. 1/6; the patient's wound on the left lateral lower leg has healed. Still has 1 on the right medial. 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 Using compression pumps at home twice a day. 1/13; left lateral lower leg is still healed. He has a deep punched out rectangular shaped wound on the right medial calf. Looking down at this it appears that he is attempting to epithelialize around the edges of the wound and on the base as well. His edema is reasonably well controlled we have been using collagen with absolutely no effect 1/20; left lateral lower leg remains closed he has extremitease stockings. The area on the right medial calf I aggressively debrided last week measures larger but the surface looks better. We have been using Hydrofera Blue. We ran Oasis through his insurance but we have not seen the results of this 1/27; left lower leg wound with chronic venous insufficiency and secondary lymphedema. I did aggressive debridement on this last week the wound seems to have come in healthy looking surface using Hydrofera Blue. He was denied for Oasis 2/3; small divot in the right medial lower leg.  Under illumination the walls of this divot are epithelialized however the base has slough which I removed with a curette we have been using Hydrofera Blue 2/10 small divot on the right medial lower leg pinpoint illumination at the base of this cone-shaped wound. We have been using Hydrofera Blue but I will switch to calcium alginate this week 2/17; the small divot on the right medial lower leg is fully epithelialized. There is no visible open area under illumination. He has his own stocking for the  right leg similar to the one he has been wearing on the left. 03/26/2022; READMISSION This is a now 77 year old man that we had in the clinic from 02/23/2020 through 07/05/2020. At that point he had bilateral lower extremity wounds left greater than right in the setting of severe lymphedema. He had already obtained compression pumps ordered for him I think from the wound care clinic in [facility] so I do not really have record of what he has been using. He claims to be using them once a day but there is a problem with the sleeve on the left leg. About 2 weeks ago he was hospitalized from 03/11/2022 through 03/14/2022 with diastolic congestive heart failure. 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 He was diuresed. Developed some prerenal azotemia and he has not been taking any diuretics currently. He has not been putting stockings on his legs since he got out of hospital and still has his legs dependent for long periods. Past medical history history of prostate cancer treated with prostatectomy and radiation this was apparently about 8 years ago, history of DVT on chronic Coumadin, history of lymphedema was managed for a while at the clinic in [place]. History of inguinal hernia repair in September 22, hypertension, stage IIIb chronic renal failure ABIs today were noncompressible on the right 1.12 on the left 04-02-2022 upon evaluation today patient appears to be doing well currently in regard to his legs I do feel like both areas that are draining are actually much drier than they were in the picture last week although the left is drier than the right. He is tolerating the 4-layer compression wraps at this point he did contact the pump company and they are actually working on getting him a new compression sleeve for one of his legs which have previously popped and was not functioning properly. 04-23-2022 upon evaluation today patient appears  to be doing well currently in regard to his wounds on the legs. I am actually very pleased with where things stand and I do feel like that we are headed in the right direction. Fortunately there is no sign of active infection locally or systemically at this time. 05-07-2022 upon evaluation today patient appears to be doing well currently in regard to his wounds in fact things are showing signs of improvement which is good news I do not see too much that actually appears to be open and I am very pleased in that regard. No fevers, chills, nausea, vomiting, or diarrhea. 05-21-2022 upon evaluation today patient appears to be doing somewhat poorly in regard to drainage of his lower extremities bilaterally. The right is greater than left as far as the weeping area. Nonetheless it seems to be getting worse not better. He actually has pitting edema which is at least 2+ to the thighs and I am concerned about the fact that he is may be fluid overloaded in general and that is the reason why  we cannot get this under control. I know he is not using his pumps all the time because he actually told the nurse that he was either going to pump or he was going to use his fluid pills but not do both. For that reason I do think that he needs to be really doing both in order to get the fluid out as effectively as possible obviously with the 4-layer compression wraps were doing as much as we can from a compression standpoint but it is really not enough. He tells me that he elevates his leg is much as he can in between pumping and other activity throughout the day. 05-28-2022 upon evaluation today patient appears to be doing better in regard to his wounds although the measurements may be a little bit larger this is a very difficult wound to heal it is very indistinct in a lot of areas. Nonetheless there is can be some need for sharp debridement in regard to both medial and lateral legs. Fortunately I see no signs of active  infection locally nor systemically at this time. No fevers, chills, nausea, vomiting, or diarrhea. 06-04-2022 upon evaluation today patient appears to be doing poorly in general in regard to the wounds on his legs. He still continues to have a tremendous amount of fluid not just in the lower portion of his leg but to be honest his thigh where he has 2-3+ pitting edema in the thigh as well. Unfortunately I do not know that we will be able to get this healed effectively and keep it healed on the lower extremities unless he gets the overall fluid situation taking and under control. Fortunately I do not see any signs of infection locally nor systemically which is great news. He just seems to be very fluid overloaded. 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 He has been tolerating the dressing changes with the compression wraps without complication. Fortunately I do not see any evidence of infection at this time which is great news. No fevers, chills, nausea, vomiting, or diarrhea. 06-18-2022 upon evaluation today patient appears to be doing well currently in regard to his wounds as far as not looking like they are terribly infected but nonetheless I am concerned about a subacute infection secondary to the fact that he continues to have spreading despite the compression therapy. We actually did do an Unna boot on him last week this is actually the first wrap that actually stayed up everything else has been sliding down quite significantly. Fortunately there does not appear to be any signs of infection systemically at this time. With that being said I do believe that locally there seems to be an issue going on here and again I Ernie Hew do a PCR culture to see what that shows also think that I am going to put him on a broad-spectrum antibiotic, doxycycline to see how that will help as well. He does  tell me that coming into the clinic today that he was feeling short of breath like "he was about to have a heart attack" because he was having such a hard time breathing. He says that he told this to Dr. Jodelle Green his cardiologist as well when he was evaluated in the [place] ([id number]) [id number] Page 3 of 14 past 1 to 2 weeks. 07-02-2022 upon evaluation today patient appears to be doing poorly currently in regard to his wound. He has been tolerating the dressing changes.  Unfortunately he has not had any compression wraps on for the past week because he was unable to make it in for his appointment last week. With that being said he has a significant amount of drainage he tells me has been using his pumps but despite this in the pumps he still has been draining quite a bit. The drainage is also somewhat purulent unfortunately. We did attempt to get in touch with his cardiologist last week unfortunately we were unable to get up with him I did advise that the patient needs to get in touch with him upon leaving today in order to make sure they know he is on the new antibiotics I am going to send him this will be Levaquin and Augmentin. 07-09-2022 upon evaluation today patient appears to be doing about the same in regard to his legs he may have just a slight amount of improvement with regard to the drainage probably Keystone topical antibiotics are helping in this regard to some degree. Fortunately there does not appear to be any signs of active infection systemically which is great news. No fevers, chills, nausea, vomiting, or diarrhea. 07-16-2022 upon evaluation today patient appears to be doing well currently in regard to his wound. He has been tolerating the dressing changes without complication. Fortunately there does not appear to be any signs of active infection locally nor systemically at this time. With that being said he cannot keep the wraps up he tells me on the  left side he had to cut this down because it got too tight. He has been using his pumps but he is on the right side the wrap actually straight down causing some pushing around the central part of his leg just below the calf I think this is a bigger risk for him that help at this point. I think that we may need to try something different he should be getting his compression socks shortly he tells me they were ordered last Thursday. 3/6; ; this is a patient who lives in [place]. He has severe bilateral lymphedema. He has compression pumps, we have been using kerlix Ace wrap Keystone. He is changing the dressing. We do not have home health. 08-06-2022 upon evaluation today patient appears to be doing a little better in regard to his wounds in general at this point. Fortunately there does not appear to be any signs of active infection locally nor systemically at this time which is great news and overall I am extremely pleased with where we stand today. 08-13-2022 upon evaluation today patient appears to actually be doing significantly better compared to last week. He actually did go to the hospital I told him that he needed to when he left here and he actually did go. With that being said they actually ended up admitting him he was having shortness of breath and I thought it might be related to congestive heart failure turns out he actually had a pulmonary embolism. Subsequently they were able to get him off of the Coumadin switching over to Eliquis to get things stabilized in that regard they also had them wrapped and got his swelling under control on his legs he actually looks much better pretty much across the board at this point. I am very pleased in that regard. With that being said I am very happy that he finally went that could have been a very dangerous situation. 08-20-2022 upon evaluation today patient appears to be doing well currently in regard to his wound. Has been  tolerating the dressing changes  without complication. Fortunately there does not appear to be any signs of active infection locally nor systemically at this time. I think his legs are doing better there is some need for sharp debridement today. 08-27-2022 upon evaluation patient is actually making excellent progress. I am actually very pleased with where he stands and I think that he is moving in the right direction. In general I think that we are looking pretty good at the moment. 09-03-2022 upon evaluation today patient appears to be doing well currently in regard to his wound. He is actually tolerating dressing changes on the left and right leg without complication. Fortunately I do not see any need for debridement of the left leg the right leg I think we probably do some need to perform some debridement here. 09-10-2022 upon evaluation today patient's wounds actually showed signs of improvement in both legs I do not see much is going require debridement today which was great news. Fortunately I do not see any evidence of infection which I think is also excellent he seems to be using his pumps and doing everything right I am happy about how this is progressing at this point. 09-17-2022 upon evaluation today patient appears to be doing decently well in regard to his wounds. He has been tolerating the dressing changes without complication. Fortunately there does not appear to be any signs of active infection at this time which is good news. 09-24-2022 upon evaluation today patient appears to be doing well currently in regard to his wounds. He has been tolerating the dressing changes without complication. Fortunately there does not appear to be any signs of active infection locally nor systemically which is great news. No fevers, chills, nausea, vomiting, or diarrhea. 10-08-2022 upon evaluation today patient appears to be doing excellent currently in regard to his wound. He has been tolerating the dressing changes without complication  though it does not sound like he has been using his compression wraps for a bit here. He does think he was doing better with the [facility] topical antibiotics we can definitely go back to using that but I think the biggest issue here is that his swelling is just very out of control and needs to be under control. I discussed that with him today. 10-15-2022 upon evaluation today patient appears to be doing well currently in regard to his wounds. He is actually making some progress which is good news. Fortunately I do not see any evidence of active infection locally nor systemically which is great news as well. No fevers, chills, nausea, vomiting, or diarrhea. He does have a callused area on the plantar aspect of his left foot which is actually causing some pain and he wonders if I can trim this down for him. 10-22-2022 upon evaluation today patient seems to be making progress. He is actually doing quite well and very pleased in that regard. I do not see any signs of active infection at this time. 11-05-2022 upon evaluation patient appears to be making progress although slowly towards closure. He seems to be doing well with regard to his legs were still using the [facility] topical antibiotics and he seems to be doing quite well. He is going require some sharp debridement today. 11-19-2022 upon evaluation today patient unfortunately appears to be extremely swollen at this point. He tells me that he ran out of supplies he also tells me his leg started leaking more because of not having supplies he was unable to wear his compression  wraps that is the juxta fit compression wraps. Therefore his legs are extremely swollen much larger than normal and do not appear to be doing well at all today. He is going require some debridement I also think he is going require [place] to perform compression wrapping today. 11-26-2022 upon evaluation today patient appears to be doing well currently in regard to his legs as far as infection  is concerned I see nothing that appears to be infected. Fortunately I do not see any signs of active infection systemically either which is also good news. With that being said he still is extremely swollen as far as his legs are concerned. I do not see any signs of overall worsening but also do not see any signs of significant improvement which is the major issue here. 12-03-2022 upon evaluation today patient appears to be doing poorly still in regard to his legs the apparently has been taken off the wraps and not leaving the week by week. With that being said he has not had really the dressings to put on the release that is running out and subsequently though he is using his wraps I am not sure that he has been keeping them on like he needs to. I explained to him I really wanted to have the wraps that I put on him left on until he comes back to see me so that we can keep the compression under better control. He voiced understanding today he tells me that he was "confused about that". Nonetheless based on what I am seeing right now also think that he is up on his feet too much I think he needs to get the feet elevated. 7/24; this is a patient with severe bilateral stage III lymphedema. On the right lower leg medially is a large area of macerated denuded skin was too small deeper areas in this. On the left leg he is 2 areas that are a little more standard in terms of wounds. Massive lymphedema bilaterally. He has compression pumps at home which he uses for 1 hour twice a day 12-17-2022 upon evaluation today patient's legs though a little bit smaller still continue to have significant issues with weeping here this just does not dry. I really think he needs to be changing this daily and this means we will probably have to try to go back to the Velcro wraps and give this a shot. I think may be going to the Velcro wraps, changing the dressing to a superabsorber like XtraSorb or the like, and have him elevate  his legs and do his pumps 3 times per day is [name] [name] ([id number]) [id number] Page 4 of 14 probably going to be the way to go to try to see if we can make some improvement here. 12-24-2022 upon evaluation today patient unfortunately is still struggling to get anything under control. We have been extremely aggressive and trying to: Control his swelling everything we do however seems to be met with resistance. He tells me that he is pumping 3 times per day with his lymphedema pumps. He also tells me that he is try to elevate his legs is much as possible and that subsequently he has been keeping his compression wraps on. He goes back and forth between saying that he can keep our wraps on and not but right now I really do not see that he is doing well with the juxta fit is at least not from the standpoint of improving the overall appearance I think it  does to some degree help maintain but we are still struggling here. I have voiced a concern to the patient that despite everything you are doing I am still not seeing a lot of improvement here and to be honest I am not sure what is can take get this under control he may have to go into the hospital for admission. 12-31-2022 upon evaluation today patient appears to be doing well currently in regard to his wounds. They seem a little better although he has several pustules around the legs in general but has been concerned about infection. I am actually going to go ahead and see about getting an antibiotic sent into the pharmacy today although I am going to obtain a PCR culture as well. 01-07-2023 upon evaluation today patient appears to be doing well currently in regard to his wound. Has been tolerating the dressing changes without complication. He actually seems to be doing much better. He did have a PCR culture which showed Serratia as well and the prominent organisms here. Levaquin was actually a good option to treat the  Serratia along with the other organisms that were problematic including E. coli. Nonetheless I think coupled with the topical Keystone antibiotics and the alginate he is really doing quite well today. 8/28; patient with severe bilateral lymphedema he has wounds bilaterally on his lower legs but they look a lot better today. We are using silver alginate and Keystone with Urgo K2 lite wraps. He comes in today with his wounds looking quite good. He was prescribed Levaquin last time I think he will complete this in a few days. 01-21-2023 upon evaluation today patient appears to be doing worse in regard to his swelling took his wrap off he tells me he believes on Saturday. With that being said his leg is significantly swollen since that time and unfortunately does not appear to be doing nearly as good as it was last time I saw him 2 weeks ago or even last week for that matter. I am very concerned about this. 01-28-2023 upon evaluation today patient appears to be doing poorly currently in regard to his legs. He has been doing everything he says that we have recommended, he continues to use his lymphedema pumps 3 times a day, he has been doubling up on his fluid pills, he has been elevating his legs, and he tells me that he also has kept the wraps on and in fact he did have them on the day when he came in. Again that for feels the compression side of things. Nonetheless with everything going on here we still have not been able to get this completely under control. He continues to have issues with significant swelling of lower extremities and this is not limited to just his lower left portion of his legs but his thighs are fairly large as well. With that being said I really feel like that we have reached the extent of what we have to offer here at the wound care center I am wondering if we can see about making a referral for second opinion to [facility] to see if there is anything they could do to  help him out at this point. It is really not much further from his home to there as it is from his home to here. Both are about an hour distance. He lives in [place]. Electronic Signature(s) Signed: 01/28/2023 4:14:52 PM By: [name] PA-C Entered By: [name] on 01/28/2023 16:14:52 -------------------------------------------------------------------------------- Physical Exam Details Patient  Name: Date of Service: Olliff, [name] [name] 01/28/2023 2:45 PM Medical Record Number: [id number] Patient Account Number: [id number] Date of Birth/Sex: Treating RN: 1945/10/24 (77 y.o. M) Primary Care Provider: [name] Other Clinician: Referring Provider: Treating Provider/Extender: [name] Weeks in Treatment: 75 Constitutional Obese and well-hydrated in no acute distress. Respiratory normal breathing without difficulty. Psychiatric this patient is able to make decisions and demonstrates good insight into disease process. Alert and Oriented x 3. pleasant and cooperative. Notes Upon inspection the patient continues to have tremendous drainage in regard to his lower extremities. We have attempted multiple means and measures by which to improve this. Unfortunately nothing seems to be working. Again I feel like the only time that he is really seeing dramatic improvements in the past is when he has been in the hospital and they removed a significant amount of fluid by way of IV medications. Nonetheless outside of that he just really seems to be pretty stable but unchanged for the better over the past 2 to 3 months. Electronic Signature(s) Signed: 01/28/2023 4:16:00 PM By: [name] PA-C Entered By: [name] on 01/28/2023 16:16:00 [name] [name] [name] ([id number]) [id number] Page 5 of 14 -------------------------------------------------------------------------------- Physician Orders Details Patient Name: Date of Service: [name] [name] [name]  01/28/2023 2:45 PM Medical Record Number: [id number] Patient Account Number: [id number] Date of Birth/Sex: Treating RN: 03-28-1946 (77 y.o. [name], [name] Primary Care Provider: [name] Other Clinician: Referring Provider: Treating Provider/Extender: [name] in Treatment: 34 Verbal / Phone Orders: No Diagnosis Coding ICD-10 Coding Code Description [phone] Chronic venous hypertension (idiopathic) with ulcer and inflammation of bilateral lower extremity I89.0 Lymphedema, not elsewhere classified 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 other part of right lower leg with other specified severity L84 Corns and callosities Follow-up Appointments ppointment in 1 week. [name] PA Wednesday ****extra time 60 minutes**** Return A 02/04/2023 2pm ppointment in 2 weeks. [name] PA (already scheduled) 02/11/2023 2pm Return A Return appointment in 3 weeks. - Please see front desk to schedule appointment Return appointment in 1 month. - Please see front desk to schedule appointment Other: - Referral wound care at [facility]- they will call you to schedule the appointment. Anesthetic (In clinic) Topical Lidocaine 5% applied to wound bed Bathing/ Shower/ Hygiene May shower with protection but do not get wound dressing(s) wet. Protect dressing(s) with water repellant cover (for example, large plastic bag) or a cast cover and may then take shower. Edema Control - Lymphedema / SCD / Other Lymphedema Pumps. Use Lymphedema pumps on leg(s) 2-3 times a day for 45-60 minutes. If wearing any wraps or hose, do not remove them. Continue exercising as instructed. - *****pump 3 times a day for an hour each time.***** Elevate legs to the level of the heart or above for 30 minutes daily and/or when sitting for 3-4 times a day throughout the day. - +++ Important+++  ELEVATE THE LEGS>>>>>>>>>> Avoid standing for long periods of time. Exercise regularly Compression stocking or Garment 30-40 mm/Hg pressure to: - use juxtafits to both legs daily if compression wraps removed Other Edema Control Orders/Instructions: - When sitting please keep your feet up, keep legs at Heart Level or above. Use pillows behind calf to for comfort. Off-Loading Other: - When sitting please keep your feet up, keep legs at Heart Level or above. Use pillows behind calf  to for comfort. Additional Orders / Instructions Follow Nutritious Diet - Try and increase protein intake. Goal of protein intake is 60g-100g. Wound Treatment Wound #12 - Lower Leg Wound Laterality: Left, Anterior Cleanser: Soap and Water 1 x Per Week/30 Days Discharge Instructions: May shower and wash wound with dial antibacterial soap and water prior to dressing change. Peri-Wound Care: Sween Lotion (Moisturizing lotion) 1 x Per Week/30 Days Discharge Instructions: Apply moisturizing lotion as directed Prim Dressing: Aquacel Ag 1 x Per Week/30 Days ary Discharge Instructions: apply directly to wound bed. Secondary Dressing: ABD Pad, 8x10 1 x Per Week/30 Days Discharge Instructions: Apply over primary dressing as directed. Secondary Dressing: OptiLock Super Absorbent, 5x5.5 (in/in) (Generic) 1 x Per Week/30 Days Discharge Instructions: Apply directly to wound bed as directed Compression Wrap: Urgo K2, (equivalent to a 4 layer) two layer compression system, regular 1 x Per Week/30 Days Discharge Instructions: Apply Urgo K2 as directed (alternative to 4 layer compression). Maltos, [name] [name] ([id number]) [id number] Page 6 of 14 Wound #13 - Lower Leg Wound Laterality: Left, Lateral Cleanser: Soap and Water 1 x Per Week/30 Days Discharge Instructions: May shower and wash wound with dial antibacterial soap and water prior to dressing change. Peri-Wound Care: Sween Lotion (Moisturizing lotion)  1 x Per Week/30 Days Discharge Instructions: Apply moisturizing lotion as directed Prim Dressing: Aquacel Ag 1 x Per Week/30 Days ary Discharge Instructions: apply directly to wound bed. Secondary Dressing: ABD Pad, 8x10 1 x Per Week/30 Days Discharge Instructions: Apply over primary dressing as directed. Secondary Dressing: OptiLock Super Absorbent, 5x5.5 (in/in) (Generic) 1 x Per Week/30 Days Discharge Instructions: Apply directly to wound bed as directed Compression Wrap: Urgo K2, (equivalent to a 4 layer) two layer compression system, regular 1 x Per Week/30 Days Discharge Instructions: Apply Urgo K2 as directed (alternative to 4 layer compression). Wound #6 - Lower Leg Wound Laterality: Right, Medial Cleanser: Soap and Water 1 x Per Week/30 Days Discharge Instructions: May shower and wash wound with dial antibacterial soap and water prior to dressing change. Peri-Wound Care: Sween Lotion (Moisturizing lotion) 1 x Per Week/30 Days Discharge Instructions: Apply moisturizing lotion as directed Prim Dressing: Aquacel Ag 1 x Per Week/30 Days ary Discharge Instructions: apply directly to wound bed. Secondary Dressing: ABD Pad, 8x10 1 x Per Week/30 Days Discharge Instructions: Apply over primary dressing as directed. Secondary Dressing: OptiLock Super Absorbent, 5x5.5 (in/in) (Generic) 1 x Per Week/30 Days Discharge Instructions: Apply directly to wound bed as directed Compression Wrap: Urgo K2, (equivalent to a 4 layer) two layer compression system, regular 1 x Per Week/30 Days Discharge Instructions: Apply Urgo K2 as directed (alternative to 4 layer compression). Wound #7 - Lower Leg Wound Laterality: Left, Medial Cleanser: Soap and Water 1 x Per Week/30 Days Discharge Instructions: May shower and wash wound with dial antibacterial soap and water prior to dressing change. Peri-Wound Care: Sween Lotion (Moisturizing lotion) 1 x Per Week/30 Days Discharge Instructions: Apply moisturizing  lotion as directed Prim Dressing: Aquacel Ag 1 x Per Week/30 Days ary Discharge Instructions: apply directly to wound bed. Secondary Dressing: ABD Pad, 8x10 1 x Per Week/30 Days Discharge Instructions: Apply over primary dressing as directed. Secondary Dressing: OptiLock Super Absorbent, 5x5.5 (in/in) (Generic) 1 x Per Week/30 Days Discharge Instructions: Apply directly to wound bed as directed Compression Wrap: Urgo K2, (equivalent to a 4 layer) two layer compression system, regular 1 x Per Week/30 Days Discharge Instructions: Apply Urgo K2 as directed (alternative to 4 layer compression). Dealer  W[facility]- [facility] - Referral to [facility] related to bilateral lower leg wounds with lymphedema and venous hypertension. Second opinion regarding any other treatment options. - (ICD10 R767458 - Chronic venous hypertension (idiopathic) with ulcer and inflammation of bilateral lower extremity) Electronic Signature(s) Unsigned Entered By: [name] on 01/28/2023 16:05:01 Signature(s): Griffey, [name] [name] ([id number]) [id number] Date(s): [id number] Page 7 of 14 Prescription 01/28/2023 -------------------------------------------------------------------------------- [name] [name] Balm PA Patient Name: Provider: 08/17/1945 [id number] Date of Birth: NPI#: Judie Petit [id number] Sex: DEA #: [id number] [phone] Phone #: License #: [employer] Signs: Patient Address: [address] Eligha Bridegroom [facility] [place], [place] [address] [address] [place], [place] [address] [phone] Allergies cephalexin; mometasone furoate; doxycycline monohydrate Provider's Orders Wound Care- [facility] - ICD10: Z66.063 - Referral to [facility] related to bilateral lower leg wounds with lymphedema and venous hypertension. Second opinion regarding any other treatment options. Hand Signature: Date(s): Electronic  Signature(s) Unsigned Entered By: [name] on 01/28/2023 16:05:03 -------------------------------------------------------------------------------- Problem List Details Patient Name: Date of Service: [name] [name] [name] 01/28/2023 2:45 PM Medical Record Number: [id number] Patient Account Number: [id number] Date of Birth/Sex: Treating RN: [birth year] (77 y.o. M) Primary Care Provider: [name] Other Clinician: Referring Provider: Treating Provider/Extender: [name] in Treatment: 85 Active Problems ICD-10 Encounter Code Description Active Date MDM Diagnosis I87.333 Chronic venous hypertension (idiopathic) with ulcer and inflammation of 06/11/2022 No Yes bilateral lower extremity I89.0 Lymphedema, not elsewhere classified 03/26/2022 No Yes L97.828 Non-pressure chronic ulcer of other part of left lower leg with other specified 03/26/2022 No Yes severity 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 other part of right lower leg with other specified 03/26/2022 No Yes [name] [name] ([id number]) [id number] Page 8 of 14 severity L84 Corns and callosities 10/15/2022 No Yes Inactive Problems Resolved Problems Electronic Signature(s) Signed: 01/28/2023 2:34:21 PM By: [name] PA-C Entered By: [name] on 01/28/2023 14:34:21 -------------------------------------------------------------------------------- Progress Note Details Patient Name: Date of Service: [name] [name] [name] 01/28/2023 2:45 PM Medical Record Number: [id number] Patient Account Number: [id number] Date of Birth/Sex: Treating RN: 1946-03-07 (77 y.o. M) Primary Care Provider: [name] Other Clinician: Referring Provider: Treating Provider/Extender: [name] Weeks in Treatment: 53 Subjective Chief Complaint Information obtained from Patient 02/23/2020; patient is here for wounds on his bilateral lower legs in the setting of severe  lymphedema 03/26/2022; patient is here for wounds on his bilateral lower legs medial aspect History of Present Illness (HPI) ADMISSION 02/23/2020 Patient is a 77 year old man who lives in [place] who arrives accompanied by his wife. He has a history of chronic lymphedema and venous insufficiency in his bilateral lower legs which may have something to do that with having a history of DVT as well as being treated for prostate cancer. In any case he recently got compression pumps at home but compliance has been an issue here. He has compression stockings however they are probably not sufficient enough to control swelling. They tell us that things deteriorated for him in late August he was admitted to [facility] for 7 days. This was with cellulitis I think of his bilateral lower legs. Discharge he was noted to have wounds on his bilateral lower legs. He was discharged on Bactrim. They tried to get him home health through [facility] of course they declined him. His wife is been wrapping these applying some form of silver foam dressing. He has a history of wounds  before although nothing that would not heal with basic home topical dressings. He has 2 areas on the left medial, left anterior and left lateral and a smaller area on the right medial. All of these have considerable depth. Past medical history includes iron deficiency anemia, lymphedema followed by the rehab center at [facility] with lymphedema wraps I believe, DVT on chronic anticoagulation, prostate cancer, chronic venous insufficiency, hypertension. As mentioned he has compression pumps but does not use them. ABIs in our clinic were noncompressible bilaterally 10/14; patient with severe bilateral lymphedema right greater than left. He came in with bilateral lower extremity wounds left greater than right. Even though the right side has more of the edema most of the wounds here almost closed on the right medial. He has 3  remaining wounds on the left We have been using silver alginate under 4-layer compression I have been trying to get him to be compliant with his external compression pumps 10/21; patient with 3 small wounds on the left leg and 1 on the right medial in the setting of severe lymphedema and chronic venous insufficiency. We have been using silver alginate under 4-layer compression he is using his external compression pumps twice a day 11/4; ARTERIAL STUDIES on the right show an ABI of 1.02 TBI of 0.858 with biphasic waveforms on the left 0.98 with a TBI of 0.55 and biphasic waveforms. Does not look like he has significant arterial disease. We are treating him for lymphedema he has compression pumps. He has punched-out areas on the left anterior left lateral and right medial lower extremities 11/11; after we obtained his arterial studies I put him in 4 layer compression. He is using his compression pumps probably once a day although I have asked him to do twice. Primary dressing to the wound is silver collagen he has severe lymphedema likely secondary to chronic venous insufficiency. Wounds on the left lateral, left medial and left anterior and a small area on the right medial 12/2; the area on the right anterior lower leg has healed. We initially thought that the area medially had healed as well however when her discharge nurse came in she detected fluid in the wound simply opened up. This is actually worse than I remember this pain. The area on the left lateral potentially slightly smaller He is also complaining about pain in his left hand he says that this is actually been getting some better he has been using topical creams on this. She asked that I look at this 12/9 after last weeks issues we have 2 wounds one on the right medial lower leg and 1 on the left lateral. Both of these are in the same condition. I think because of thickened skin secondary to chronic lymphedema these wounds actually have  depth of almost 0.8 cm. 12/16; the patient has 2 small but deep wounds one on the right medial and one on the left lateral. The right medial is actually the worst of these. He arrives in [place], [facility] ([id number]) [id number] Page 9 of 14 clinic today with absolutely terrible edema in the right leg apparently his 4-layer wrap fell down to just above his ankle he did not think about this he is apparently been continuing to use his compression pump twice a day. The left leg looks a lot better. 05/09/2020 upon evaluation today patient appears to be doing decently well in regard to his wounds. Everything is measuring smaller the right leg still has a little bit deeper wound in the  left seems to be almost completely healed in my opinion I am very pleased in general with how things are progressing. He has a 4- layer compression wrap we have been using endoform today we will probably have to use collagen just based on the fact that we do not have endoform it is on order. 1/6; the patient's wound on the left lateral lower leg has healed. Still has 1 on the right medial. 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 Using compression pumps at home twice a day. 1/13; left lateral lower leg is still healed. He has a deep punched out rectangular shaped wound on the right medial calf. Looking down at this it appears that he is attempting to epithelialize around the edges of the wound and on the base as well. His edema is reasonably well controlled we have been using collagen with absolutely no effect 1/20; left lateral lower leg remains closed he has extremitease stockings. The area on the right medial calf I aggressively debrided last week measures larger but the surface looks better. We have been using Hydrofera Blue. We ran Oasis through his insurance but we have not seen the results of this 1/27; left lower leg wound with chronic venous insufficiency and secondary  lymphedema. I did aggressive debridement on this last week the wound seems to have come in healthy looking surface using Hydrofera Blue. He was denied for Oasis 2/3; small divot in the right medial lower leg. Under illumination the walls of this divot are epithelialized however the base has slough which I removed with a curette we have been using Hydrofera Blue 2/10 small divot on the right medial lower leg pinpoint illumination at the base of this cone-shaped wound. We have been using Hydrofera Blue but I will switch to calcium alginate this week 2/17; the small divot on the right medial lower leg is fully epithelialized. There is no visible open area under illumination. He has his own stocking for the right leg similar to the one he has been wearing on the left. 03/26/2022; READMISSION This is a now 77 year old man that we had in the clinic from 02/23/2020 through 07/05/2020. At that point he had bilateral lower extremity wounds left greater than right in the setting of severe lymphedema. He had already obtained compression pumps ordered for him I think from the wound care clinic in [facility] so I do not really have record of what he has been using. He claims to be using them once a day but there is a problem with the sleeve on the left leg. About 2 weeks ago he was hospitalized from 03/11/2022 through 03/14/2022 with diastolic congestive heart failure. 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 He was diuresed. Developed some prerenal azotemia and he has not been taking any diuretics currently. He has not been putting stockings on his legs since he got out of hospital and still has his legs dependent for long periods. Past medical history history of prostate cancer treated with prostatectomy and radiation this was apparently about 8 years ago, history of DVT on chronic Coumadin, history of lymphedema was managed for a while at the clinic in  [place]. History of inguinal hernia repair in September 22, hypertension, stage IIIb chronic renal failure ABIs today were noncompressible on the right 1.12 on the left 04-02-2022 upon evaluation today patient appears to be doing well currently in regard to his legs I do feel like both areas  that are draining are actually much drier than they were in the picture last week although the left is drier than the right. He is tolerating the 4-layer compression wraps at this point he did contact the pump company and they are actually working on getting him a new compression sleeve for one of his legs which have previously popped and was not functioning properly. 04-23-2022 upon evaluation today patient appears to be doing well currently in regard to his wounds on the legs. I am actually very pleased with where things stand and I do feel like that we are headed in the right direction. Fortunately there is no sign of active infection locally or systemically at this time. 05-07-2022 upon evaluation today patient appears to be doing well currently in regard to his wounds in fact things are showing signs of improvement which is good news I do not see too much that actually appears to be open and I am very pleased in that regard. No fevers, chills, nausea, vomiting, or diarrhea. 05-21-2022 upon evaluation today patient appears to be doing somewhat poorly in regard to drainage of his lower extremities bilaterally. The right is greater than left as far as the weeping area. Nonetheless it seems to be getting worse not better. He actually has pitting edema which is at least 2+ to the thighs and I am concerned about the fact that he is may be fluid overloaded in general and that is the reason why we cannot get this under control. I know he is not using his pumps all the time because he actually told the nurse that he was either going to pump or he was going to use his fluid pills but not do both. For that reason I do  think that he needs to be really doing both in order to get the fluid out as effectively as possible obviously with the 4-layer compression wraps were doing as much as we can from a compression standpoint but it is really not enough. He tells me that he elevates his leg is much as he can in between pumping and other activity throughout the day. 05-28-2022 upon evaluation today patient appears to be doing better in regard to his wounds although the measurements may be a little bit larger this is a very difficult wound to heal it is very indistinct in a lot of areas. Nonetheless there is can be some need for sharp debridement in regard to both medial and lateral legs. Fortunately I see no signs of active infection locally nor systemically at this time. No fevers, chills, nausea, vomiting, or diarrhea. 06-04-2022 upon evaluation today patient appears to be doing poorly in general in regard to the wounds on his legs. He still continues to have a tremendous amount of fluid not just in the lower portion of his leg but to be honest his thigh where he has 2-3+ pitting edema in the thigh as well. Unfortunately I do not know that we will be able to get this healed effectively and keep it healed on the lower extremities unless he gets the overall fluid situation taking and under control. Fortunately I do not see any signs of infection locally nor systemically which is great news. He just seems to be very fluid overloaded. 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 He has been tolerating the dressing changes with the compression wraps without complication. Fortunately I do not see any evidence of infection  at this time which is great news. No fevers, chills, nausea, vomiting, or diarrhea. 06-18-2022 upon evaluation today patient appears to be doing well currently in regard to his wounds as far as not  looking like they are terribly infected but nonetheless I am concerned about a subacute infection secondary to the fact that he continues to have spreading despite the compression therapy. We actually did do an Unna boot on him last week this is actually the first wrap that actually stayed up everything else has been sliding down quite significantly. Fortunately there does not appear to be any signs of infection systemically at this time. With that being said I do believe that locally there seems to be an issue going on here and again I Ernie Hew do a PCR culture to see what that shows also think that I am going to put him on a broad-spectrum antibiotic, doxycycline to see how that will help as well. He does tell me that coming into the clinic today that he was feeling short of breath like "he was about to have a heart attack" because he was having such a hard time breathing. He says that he told this to Dr. Jodelle Green his cardiologist as well when he was evaluated in the past 1 to 2 weeks. 07-02-2022 upon evaluation today patient appears to be doing poorly currently in regard to his wound. He has been tolerating the dressing changes. Unfortunately he has not had any compression wraps on for the past week because he was unable to make it in for his appointment last week. With that being said he has a significant amount of drainage he tells me has been using his pumps but despite this in the pumps he still has been draining quite a bit. The drainage is also somewhat purulent unfortunately. We did attempt to get in touch with his cardiologist last week unfortunately we were unable to get up with him I did advise that the patient needs to get in touch with him upon leaving today in order to make sure they know he is on the new antibiotics I am going to send him this will be Levaquin and Augmentin. 07-09-2022 upon evaluation today patient appears to be doing about the same in regard to his legs he may have just a  slight amount of improvement with regard [name] [name] ([id number]) [id number] Page 10 of 14 to the drainage probably Keystone topical antibiotics are helping in this regard to some degree. Fortunately there does not appear to be any signs of active infection systemically which is great news. No fevers, chills, nausea, vomiting, or diarrhea. 07-16-2022 upon evaluation today patient appears to be doing well currently in regard to his wound. He has been tolerating the dressing changes without complication. Fortunately there does not appear to be any signs of active infection locally nor systemically at this time. With that being said he cannot keep the wraps up he tells me on the left side he had to cut this down because it got too tight. He has been using his pumps but he is on the right side the wrap actually straight down causing some pushing around the central part of his leg just below the calf I think this is a bigger risk for him that help at this point. I think that we may need to try something different he should be getting his compression socks shortly he tells me they were ordered last Thursday. 3/6; ; this is a patient who lives in [place].  He has severe bilateral lymphedema. He has compression pumps, we have been using kerlix Ace wrap Keystone. He is changing the dressing. We do not have home health. 08-06-2022 upon evaluation today patient appears to be doing a little better in regard to his wounds in general at this point. Fortunately there does not appear to be any signs of active infection locally nor systemically at this time which is great news and overall I am extremely pleased with where we stand today. 08-13-2022 upon evaluation today patient appears to actually be doing significantly better compared to last week. He actually did go to the hospital I told him that he needed to when he left here and he actually did go. With that being said they actually  ended up admitting him he was having shortness of breath and I thought it might be related to congestive heart failure turns out he actually had a pulmonary embolism. Subsequently they were able to get him off of the Coumadin switching over to Eliquis to get things stabilized in that regard they also had them wrapped and got his swelling under control on his legs he actually looks much better pretty much across the board at this point. I am very pleased in that regard. With that being said I am very happy that he finally went that could have been a very dangerous situation. 08-20-2022 upon evaluation today patient appears to be doing well currently in regard to his wound. Has been tolerating the dressing changes without complication. Fortunately there does not appear to be any signs of active infection locally nor systemically at this time. I think his legs are doing better there is some need for sharp debridement today. 08-27-2022 upon evaluation patient is actually making excellent progress. I am actually very pleased with where he stands and I think that he is moving in the right direction. In general I think that we are looking pretty good at the moment. 09-03-2022 upon evaluation today patient appears to be doing well currently in regard to his wound. He is actually tolerating dressing changes on the left and right leg without complication. Fortunately I do not see any need for debridement of the left leg the right leg I think we probably do some need to perform some debridement here. 09-10-2022 upon evaluation today patient's wounds actually showed signs of improvement in both legs I do not see much is going require debridement today which was great news. Fortunately I do not see any evidence of infection which I think is also excellent he seems to be using his pumps and doing everything right I am happy about how this is progressing at this point. 09-17-2022 upon evaluation today patient appears to  be doing decently well in regard to his wounds. He has been tolerating the dressing changes without complication. Fortunately there does not appear to be any signs of active infection at this time which is good news. 09-24-2022 upon evaluation today patient appears to be doing well currently in regard to his wounds. He has been tolerating the dressing changes without complication. Fortunately there does not appear to be any signs of active infection locally nor systemically which is great news. No fevers, chills, nausea, vomiting, or diarrhea. 10-08-2022 upon evaluation today patient appears to be doing excellent currently in regard to his wound. He has been tolerating the dressing changes without complication though it does not sound like he has been using his compression wraps for a bit here. He does think he was doing  better with the [facility] topical antibiotics we can definitely go back to using that but I think the biggest issue here is that his swelling is just very out of control and needs to be under control. I discussed that with him today. 10-15-2022 upon evaluation today patient appears to be doing well currently in regard to his wounds. He is actually making some progress which is good news. Fortunately I do not see any evidence of active infection locally nor systemically which is great news as well. No fevers, chills, nausea, vomiting, or diarrhea. He does have a callused area on the plantar aspect of his left foot which is actually causing some pain and he wonders if I can trim this down for him. 10-22-2022 upon evaluation today patient seems to be making progress. He is actually doing quite well and very pleased in that regard. I do not see any signs of active infection at this time. 11-05-2022 upon evaluation patient appears to be making progress although slowly towards closure. He seems to be doing well with regard to his legs were still using the [facility] topical antibiotics and he seems  to be doing quite well. He is going require some sharp debridement today. 11-19-2022 upon evaluation today patient unfortunately appears to be extremely swollen at this point. He tells me that he ran out of supplies he also tells me his leg started leaking more because of not having supplies he was unable to wear his compression wraps that is the juxta fit compression wraps. Therefore his legs are extremely swollen much larger than normal and do not appear to be doing well at all today. He is going require some debridement I also think he is going require [place] to perform compression wrapping today. 11-26-2022 upon evaluation today patient appears to be doing well currently in regard to his legs as far as infection is concerned I see nothing that appears to be infected. Fortunately I do not see any signs of active infection systemically either which is also good news. With that being said he still is extremely swollen as far as his legs are concerned. I do not see any signs of overall worsening but also do not see any signs of significant improvement which is the major issue here. 12-03-2022 upon evaluation today patient appears to be doing poorly still in regard to his legs the apparently has been taken off the wraps and not leaving the week by week. With that being said he has not had really the dressings to put on the release that is running out and subsequently though he is using his wraps I am not sure that he has been keeping them on like he needs to. I explained to him I really wanted to have the wraps that I put on him left on until he comes back to see me so that we can keep the compression under better control. He voiced understanding today he tells me that he was "confused about that". Nonetheless based on what I am seeing right now also think that he is up on his feet too much I think he needs to get the feet elevated. 7/24; this is a patient with severe bilateral stage III lymphedema. On the  right lower leg medially is a large area of macerated denuded skin was too small deeper areas in this. On the left leg he is 2 areas that are a little more standard in terms of wounds. Massive lymphedema bilaterally. He has compression pumps at home which  he uses for 1 hour twice a day 12-17-2022 upon evaluation today patient's legs though a little bit smaller still continue to have significant issues with weeping here this just does not dry. I really think he needs to be changing this daily and this means we will probably have to try to go back to the Velcro wraps and give this a shot. I think may be going to the Velcro wraps, changing the dressing to a superabsorber like XtraSorb or the like, and have him elevate his legs and do his pumps 3 times per day is probably going to be the way to go to try to see if we can make some improvement here. 12-24-2022 upon evaluation today patient unfortunately is still struggling to get anything under control. We have been extremely aggressive and trying to: Control his swelling everything we do however seems to be met with resistance. He tells me that he is pumping 3 times per day with his lymphedema pumps. He also tells me that he is try to elevate his legs is much as possible and that subsequently he has been keeping his compression wraps on. He goes back and forth between saying that he can keep our wraps on and not but right now I really do not see that he is doing well with the juxta fit is at least not from the standpoint of improving the overall appearance I think it does to some degree help maintain but we are still struggling here. I have voiced a concern to the patient that despite everything you are doing I am still not seeing a lot of improvement here and to be honest I am not sure what is can take get this under control he may have to go into the hospital for admission. [name] [name] [name] ([id number]) [id number] Page 11 of  14 12-31-2022 upon evaluation today patient appears to be doing well currently in regard to his wounds. They seem a little better although he has several pustules around the legs in general but has been concerned about infection. I am actually going to go ahead and see about getting an antibiotic sent into the pharmacy today although I am going to obtain a PCR culture as well. 01-07-2023 upon evaluation today patient appears to be doing well currently in regard to his wound. Has been tolerating the dressing changes without complication. He actually seems to be doing much better. He did have a PCR culture which showed Serratia as well and the prominent organisms here. Levaquin was actually a good option to treat the Serratia along with the other organisms that were problematic including E. coli. Nonetheless I think coupled with the topical Keystone antibiotics and the alginate he is really doing quite well today. 8/28; patient with severe bilateral lymphedema he has wounds bilaterally on his lower legs but they look a lot better today. We are using silver alginate and Keystone with Urgo K2 lite wraps. He comes in today with his wounds looking quite good. He was prescribed Levaquin last time I think he will complete this in a few days. 01-21-2023 upon evaluation today patient appears to be doing worse in regard to his swelling took his wrap off he tells me he believes on Saturday. With that being said his leg is significantly swollen since that time and unfortunately does not appear to be doing nearly as good as it was last time I saw him 2 weeks ago or even last week for that matter. I am very concerned about this.  01-28-2023 upon evaluation today patient appears to be doing poorly currently in regard to his legs. He has been doing everything he says that we have recommended, he continues to use his lymphedema pumps 3 times a day, he has been doubling up on his fluid pills, he has been elevating his legs,  and he tells me that he also has kept the wraps on and in fact he did have them on the day when he came in. Again that for feels the compression side of things. Nonetheless with everything going on here we still have not been able to get this completely under control. He continues to have issues with significant swelling of lower extremities and this is not limited to just his lower left portion of his legs but his thighs are fairly large as well. With that being said I really feel like that we have reached the extent of what we have to offer here at the wound care center I am wondering if we can see about making a referral for second opinion to [facility] to see if there is anything they could do to help him out at this point. It is really not much further from his home to there as it is from his home to here. Both are about an hour distance. He lives in [place]. Objective Constitutional Obese and well-hydrated in no acute distress. Vitals Time Taken: 3:02 PM, Height: 74 in, Weight: 250 lbs, BMI: 32.1, Temperature: 98.0 F, Pulse: 86 bpm, Respiratory Rate: 18 breaths/min, Blood Pressure: 173/64 mmHg. Respiratory normal breathing without difficulty. Psychiatric this patient is able to make decisions and demonstrates good insight into disease process. Alert and Oriented x 3. pleasant and cooperative. General Notes: Upon inspection the patient continues to have tremendous drainage in regard to his lower extremities. We have attempted multiple means and measures by which to improve this. Unfortunately nothing seems to be working. Again I feel like the only time that he is really seeing dramatic improvements in the past is when he has been in the hospital and they removed a significant amount of fluid by way of IV medications. Nonetheless outside of that he just really seems to be pretty stable but unchanged for the better over the past 2 to 3 months. Integumentary (Hair, Skin) Wound #12  status is Open. Original cause of wound was Frostbite. The date acquired was: 12/17/2022. The wound has been in treatment 6 weeks. The wound is located on the Left,Anterior Lower Leg. The wound measures 1.5cm length x 0.6cm width x 0.1cm depth; 0.707cm^2 area and 0.071cm^3 volume. There is Fat Layer (Subcutaneous Tissue) exposed. There is no tunneling or undermining noted. There is a large amount of serous drainage noted. The wound margin is distinct with the outline attached to the wound base. There is small (1-33%) red granulation within the wound bed. There is a large (67-100%) amount of necrotic tissue within the wound bed including Adherent Slough. The periwound skin appearance exhibited: Hemosiderin Staining. The periwound skin appearance did not exhibit: Callus, Crepitus, Excoriation, Induration, Rash, Scarring, Dry/Scaly, Maceration, Atrophie Blanche, Cyanosis, Ecchymosis, Mottled, Pallor, Rubor, Erythema. Periwound temperature was noted as No Abnormality. Wound #13 status is Open. Original cause of wound was Gradually Appeared. The date acquired was: 12/24/2022. The wound has been in treatment 5 weeks. The wound is located on the Left,Lateral Lower Leg. The wound measures 0.7cm length x 1cm width x 0.1cm depth; 0.55cm^2 area and 0.055cm^3 volume. There is Fat Layer (Subcutaneous Tissue) exposed. There  is a medium amount of serous drainage noted. The wound margin is flat and intact. There is large (67-100%) pink granulation within the wound bed. There is a small (1-33%) amount of necrotic tissue within the wound bed including Adherent Slough. The periwound skin appearance had no abnormalities noted for texture. The periwound skin appearance had no abnormalities noted for moisture. The periwound skin appearance exhibited: Hemosiderin Staining. Periwound temperature was noted as No Abnormality. Wound #6 status is Open. Original cause of wound was Gradually Appeared. The date acquired was: 03/05/2022.  The wound has been in treatment 44 weeks. The wound is located on the Right,Medial Lower Leg. The wound measures 13cm length x 9cm width x 0.5cm depth; 91.892cm^2 area and 45.946cm^3 volume. There is Fat Layer (Subcutaneous Tissue) exposed. There is no tunneling or undermining noted. There is a large amount of serous drainage noted. The wound margin is distinct with the outline attached to the wound base. There is small (1-33%) pink, pale granulation within the wound bed. There is a large (67-100%) amount of necrotic tissue within the wound bed including Adherent Slough. The periwound skin appearance exhibited: Scarring, Hemosiderin Staining. The periwound skin appearance did not exhibit: Callus, Crepitus, Excoriation, Induration, Rash, Dry/Scaly, Maceration, Atrophie Blanche, Cyanosis, Ecchymosis, Mottled, Pallor, Rubor, Erythema. Periwound temperature was noted as No Abnormality. Wound #7 status is Open. Original cause of wound was Gradually Appeared. The date acquired was: 03/05/2022. The wound has been in treatment 44 weeks. The wound is located on the Left,Medial Lower Leg. The wound measures 3cm length x 5cm width x 0.2cm depth; 11.781cm^2 area and 2.356cm^3 volume. There is Fat Layer (Subcutaneous Tissue) exposed. There is no tunneling or undermining noted. There is a large amount of serous drainage noted. The wound margin is distinct with the outline attached to the wound base. There is large (67-100%) pink granulation within the wound bed. There is a small (1-33%) amount of necrotic tissue within the wound bed including Adherent Slough. The periwound skin appearance exhibited: Scarring, Hemosiderin Staining. The periwound skin appearance did not exhibit: Callus, Crepitus, Excoriation, Induration, Rash, Dry/Scaly, Maceration, Atrophie Blanche, Cyanosis, Ecchymosis, Mottled, Pallor, Rubor, Erythema. Periwound temperature was noted as No Abnormality. 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 There was a Double Layer Compression Therapy Procedure by [name], RN. Post procedure Diagnosis Wound #12: Same as Pre-Procedure Wound #13 Pre-procedure diagnosis of Wound #13 is a Lymphedema located on the Left,Lateral Lower Leg . There was a Double Layer Compression Therapy Procedure by [name], RN. Post procedure Diagnosis Wound #13: Same as Pre-Procedure Wound #6 Pre-procedure diagnosis of Wound #6 is a Lymphedema located on the Right,Medial Lower Leg . There was a Double Layer Compression Therapy Procedure by [name], RN. Post procedure Diagnosis Wound #6: Same as Pre-Procedure Wound #7 Pre-procedure diagnosis of Wound #7 is a Lymphedema located on the Left,Medial Lower Leg . There was a Double Layer Compression Therapy Procedure by [name], RN. Post procedure Diagnosis Wound #7: Same as Pre-Procedure Plan Follow-up Appointments: Return Appointment in 1 week. [name] PA Wednesday ****extra time 60  minutes**** 02/04/2023 2pm Return Appointment in 2 weeks. [name] PA (already scheduled) 02/11/2023 2pm Return appointment in 3 weeks. - Please see front desk to schedule appointment Return appointment in 1 month. - Please see front desk to schedule appointment Other: - Referral wound care at [facility]- they will call you to schedule the appointment. Anesthetic: (In clinic) Topical Lidocaine 5% applied to wound  bed Bathing/ Shower/ Hygiene: May shower with protection but do not get wound dressing(s) wet. Protect dressing(s) with water repellant cover (for example, large plastic bag) or a cast cover and may then take shower. Edema Control - Lymphedema / SCD / Other: Lymphedema Pumps. Use Lymphedema pumps on leg(s) 2-3 times a day for 45-60 minutes. If wearing any wraps or hose, do not remove them. Continue exercising as instructed. - *****pump 3 times a day for an hour each time.***** Elevate legs to the level of the heart or above for 30 minutes daily and/or when sitting for 3-4 times a day throughout the day. - +++ Important+++ ELEVATE THE LEGS>>>>>>>>>> Avoid standing for long periods of time. Exercise regularly Compression stocking or Garment 30-40 mm/Hg pressure to: - use juxtafits to both legs daily if compression wraps removed Other Edema Control Orders/Instructions: - When sitting please keep your feet up, keep legs at Heart Level or above. Use pillows behind calf to for comfort. Off-Loading: Other: - When sitting please keep your feet up, keep legs at Heart Level or above. Use pillows behind calf to for comfort. Additional Orders / Instructions: Follow Nutritious Diet - Try and increase protein intake. Goal of protein intake is 60g-100g. ordered were: Wound Care- [facility] - Referral to [facility] related to bilateral lower leg wounds with lymphedema and venous hypertension. Second opinion regarding any other treatment options. WOUND #12: - Lower Leg Wound Laterality: Left, Anterior Cleanser: Soap and Water 1 x Per Week/30 Days Discharge Instructions: May shower and wash wound with dial antibacterial soap and water prior to dressing change. Peri-Wound Care: Sween Lotion (Moisturizing lotion) 1 x Per Week/30 Days Discharge Instructions: Apply moisturizing lotion as directed Prim Dressing: Aquacel Ag 1 x Per Week/30 Days ary Discharge Instructions: apply directly to wound  bed. Secondary Dressing: ABD Pad, 8x10 1 x Per Week/30 Days Discharge Instructions: Apply over primary dressing as directed. Secondary Dressing: OptiLock Super Absorbent, 5x5.5 (in/in) (Generic) 1 x Per Week/30 Days Discharge Instructions: Apply directly to wound bed as directed Com pression Wrap: Urgo K2, (equivalent to a 4 layer) two layer compression system, regular 1 x Per Week/30 Days Discharge Instructions: Apply Urgo K2 as directed (alternative to 4 layer compression). WOUND #13: - Lower Leg Wound Laterality: Left, Lateral [name], Stephens ([id number]) [id number] Page 13 of 14 Cleanser: Soap and Water 1 x Per Week/30 Days Discharge Instructions: May shower and wash wound with dial antibacterial soap and water prior to dressing change. Peri-Wound Care: Sween Lotion (Moisturizing lotion) 1 x Per Week/30 Days Discharge Instructions: Apply moisturizing lotion as directed Prim Dressing: Aquacel Ag 1 x Per Week/30 Days ary Discharge Instructions: apply directly to wound bed. Secondary Dressing: ABD Pad, 8x10 1 x Per Week/30 Days Discharge Instructions: Apply over primary dressing as directed. Secondary Dressing: OptiLock Super Absorbent, 5x5.5 (in/in) (Generic) 1 x Per Week/30 Days Discharge Instructions: Apply directly to wound bed as directed Com pression Wrap: Urgo K2, (equivalent to a 4 layer) two layer compression system, regular 1 x Per Week/30 Days Discharge Instructions: Apply Urgo K2 as directed (alternative to 4 layer  compression). WOUND #6: - Lower Leg Wound Laterality: Right, Medial Cleanser: Soap and Water 1 x Per Week/30 Days Discharge Instructions: May shower and wash wound with dial antibacterial soap and water prior to dressing change. Peri-Wound Care: Sween Lotion (Moisturizing lotion) 1 x Per Week/30 Days Discharge Instructions: Apply moisturizing lotion as directed Prim Dressing: Aquacel Ag 1 x Per Week/30 Days ary Discharge Instructions:  apply directly to wound bed. Secondary Dressing: ABD Pad, 8x10 1 x Per Week/30 Days Discharge Instructions: Apply over primary dressing as directed. Secondary Dressing: OptiLock Super Absorbent, 5x5.5 (in/in) (Generic) 1 x Per Week/30 Days Discharge Instructions: Apply directly to wound bed as directed Com pression Wrap: Urgo K2, (equivalent to a 4 layer) two layer compression system, regular 1 x Per Week/30 Days Discharge Instructions: Apply Urgo K2 as directed (alternative to 4 layer compression). WOUND #7: - Lower Leg Wound Laterality: Left, Medial Cleanser: Soap and Water 1 x Per Week/30 Days Discharge Instructions: May shower and wash wound with dial antibacterial soap and water prior to dressing change. Peri-Wound Care: Sween Lotion (Moisturizing lotion) 1 x Per Week/30 Days Discharge Instructions: Apply moisturizing lotion as directed Prim Dressing: Aquacel Ag 1 x Per Week/30 Days ary Discharge Instructions: apply directly to wound bed. Secondary Dressing: ABD Pad, 8x10 1 x Per Week/30 Days Discharge Instructions: Apply over primary dressing as directed. Secondary Dressing: OptiLock Super Absorbent, 5x5.5 (in/in) (Generic) 1 x Per Week/30 Days Discharge Instructions: Apply directly to wound bed as directed Com pression Wrap: Urgo K2, (equivalent to a 4 layer) two layer compression system, regular 1 x Per Week/30 Days Discharge Instructions: Apply Urgo K2 as directed (alternative to 4 layer compression). 1. Based on what I am seeing I think that at this point the patient has been doing everything that I have recommended including the pumps 3 times a day, elevation of his legs, compression which we have been using a 4-layer compression wrap, and using the fluid pills as well. With that being said despite all of this he is still continue to have ongoing issues here with significant swelling of the lower extremities which the wounds again do not seem to be changing for the better at all  and in fact they appear to be little worse today compared to last week. Last week he was more swollen with blisters around the ankles fortunately those blisters have improved but unfortunately the legs overall have not. 2. I am going to recommend as well that the patient should continue to elevate his legs and do all the things above as much as possible tomorrow that he can do the better things should be as far as improvement is concerned but at the same time I still feel like there may be an issue here with fluid overload that she has not been well-managed from a wound care perspective. 3. After discussing things today with the patient and his wife he would like to have a second opinion at a wound care center at [facility] to see if there is anything they can recommend different for him or any other ideas that they have. Again I am happy to make that referral and give them a chance to see if they can get these wounds under control and healed. Obviously my goal ultimately is to get him healed completely. He voiced understanding and does want [place] to make that referral we will go ahead and put that in today. We will see patient back for reevaluation in 1 week here in the  clinic. If anything worsens or changes patient will contact our office for additional recommendations. We will continue to see him until he gets the appointment with the [facility] wound care center. Electronic Signature(s) Signed: 01/28/2023 4:18:06 PM By: [name] PA-C Entered By: [name] on 01/28/2023 16:18:05 -------------------------------------------------------------------------------- SuperBill Details Patient Name: Date of Service: Shawhan, [name] [name] 01/28/2023 Medical Record Number: [id number] Patient Account Number: [id number] Date of Birth/Sex: Treating RN: [birth year] (78 y.o. 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

## 2023-01-30 NOTE — Progress Notes (Signed)
Pre-procedure Electronic Signature(s) Signed: 01/28/2023 5:54:20 PM By: [name] RN, [name] [place], [name] ([id number]) PM By: [name] RN, [name] [id number] Page 3 of 12 Signed: 01/28/2023 5:54:20 Entered By: [name] on 01/28/2023 16:03:28 -------------------------------------------------------------------------------- Encounter Discharge Information Details Patient Name: Date of Service: Mabey, [name] 01/28/2023 2:45 PM Medical Record Number: [id number] Patient Account Number: [id number] Date of Birth/Sex: Treating RN: [birth year] (77 y.o. [name] Primary Care Thania Woodlief: [name] Other Clinician: Referring Zarayah Lanting: Treating Renner Sebald/Extender: [name] in Treatment: 30 Encounter Discharge Information Items Discharge Condition: Stable Ambulatory Status: Cane Discharge Destination: Home Transportation: Private Auto Accompanied By: spouse Schedule Follow-up Appointment: Yes Clinical Summary of Care: Electronic Signature(s) Signed: 01/28/2023 5:54:20 PM By: [name] RN, [name] Entered By: [name]  on 01/28/2023 16:05:54 -------------------------------------------------------------------------------- Lower Extremity Assessment Details Patient Name: Date of Service: Pinney, [name] 01/28/2023 2:45 PM Medical Record Number: [id number] Patient Account Number: [id number] Date of Birth/Sex: Treating RN: 1945/09/22 (76 y.o. [name], [name] Primary Care Honestii Marton: [name] Other Clinician: Referring Manette Doto: Treating Flora Ratz/Extender: [name] Weeks in Treatment: 44 Edema Assessment Assessed: [Left: Yes] [Right: Yes] Edema: [Left: Yes] [Right: Yes] Calf Left: Right: Point of Measurement: 40 cm From Medial Instep 55.1 cm 65 cm Ankle Left: Right: Point of Measurement: 13 cm From Medial Instep 50 cm 44 cm Vascular Assessment Pulses: Dorsalis Pedis Palpable: [Left:Yes] [Right:Yes] Extremity colors, hair growth, and conditions: Extremity Color: [Left:Hyperpigmented] [Right:Hyperpigmented] Hair Growth on Extremity: [Left:No] Temperature of Extremity: [Left:Warm] [Right:Warm] Capillary Refill: [Left:> 3 seconds] [Right:> 3 seconds] Dependent Rubor: [Left:No] [Right:No] Delbuono, Micheil ([id number]) Blanched when Elevated: [Left:No Yes] [Right:No Yes] Toe Nail Assessment Left: Right: Thick: Yes Yes Discolored: Yes Yes Deformed: Yes Yes Improper Length and Hygiene: Yes Yes Electronic Signature(s) Signed: 01/28/2023 5:54:20 PM By: [name] RN, [name] Entered By: [name] on 01/28/2023 15:36:40 -------------------------------------------------------------------------------- Multi-Disciplinary Care Plan Details Patient Name: Date of Service: Uhde, [name] 01/28/2023 2:45 PM Medical Record Number: [id number] Patient Account Number: [id number] Date of Birth/Sex: Treating RN: 06-Feb-1946 (77 y.o. [name] Primary Care Jozeph Persing: [name] Other Clinician: Referring Cletus Mehlhoff: Treating Hermina Barnard/Extender: [name] in Treatment: 98 Multidisciplinary Care Plan reviewed with physician Active Inactive Wound/Skin Impairment Nursing Diagnoses: Impaired tissue integrity Knowledge deficit related to ulceration/compromised skin integrity Goals: Patient will have a decrease in wound volume by X% from date: (specify in notes) Date Initiated: 03/26/2022 Target Resolution Date: 04/17/2025 Goal Status: Active Patient/caregiver will verbalize understanding of skin care regimen Date Initiated: 03/26/2022 Target Resolution Date: 04/17/2025 Goal Status: Active Ulcer/skin breakdown will have a volume reduction of 30% by week 4 Date Initiated: 03/26/2022 Date Inactivated: 05/21/2022 Target Resolution Date: 05/17/2022 Unmet Reason: see wound Goal Status: Unmet measurement. Ulcer/skin breakdown will have a volume reduction of 50% by week 8 Date Initiated: 03/26/2022 Date Inactivated: 05/21/2022 Target Resolution Date: 05/17/2022 Unmet Reason: see wound Goal Status: Unmet measurement. Interventions: Assess patient/caregiver ability to obtain necessary supplies Assess patient/caregiver ability to perform ulcer/skin care regimen upon admission and as needed Assess ulceration(s) every visit Notes: Patient stated today, "I will take my fluid pill or pump not do both." Keiffer Piper made aware. Electronic Signature(s) Signed: 01/28/2023 5:54:20 PM By: [name] RN, [name] Entered By: [name] on 01/28/2023 15:38:24 Schermer, [name] ([id number]) [id number] Page 5 of 12 -------------------------------------------------------------------------------- Pain Assessment Details Patient Name: Date of Service: Sewell, [name] 01/28/2023 2:45 PM Medical Record Number: [id number] Patient Account  Blanche: No Crepitus: No Cyanosis: No Excoriation: No Ecchymosis: No Induration: No Erythema: No Rash: No Hemosiderin Staining: Yes Scarring: Yes Mottled: No Pallor: No Moisture Rubor: No No Abnormalities Noted: No Dry / Scaly: No Temperature / Pain Maceration: No Temperature: No Abnormality Treatment Notes Wound #6 (Lower Leg) Wound Laterality: Right, Medial Cleanser Soap and Water Discharge Instruction: May shower and wash wound with dial antibacterial soap and water prior to dressing change. Peri-Wound Care Sween Lotion (Moisturizing lotion) Discharge Instruction:  Apply moisturizing lotion as directed Topical Primary Dressing Aquacel Ag Discharge Instruction: apply directly to wound bed. Secondary Dressing ABD Pad, 8x10 Discharge Instruction: Apply over primary dressing as directed. OptiLock Super Absorbent, 5x5.5 (in/in) Discharge Instruction: Apply directly to wound bed as directed Secured With Compression Wrap Urgo K2, (equivalent to a 4 layer) two layer compression system, regular Discharge Instruction: Apply Urgo K2 as directed (alternative to 4 layer compression). Compression Stockings Add-Ons Electronic Signature(s) Signed: 01/28/2023 5:54:20 PM By: [name] RN, [name] Entered By: [name] on 01/28/2023 15:37:52 -------------------------------------------------------------------------------- Wound Assessment Details Patient Name: Date of Service: Cotrell, [name] 01/28/2023 2:45 PM Medical Record Number: [id number] Patient Account Number: [id number] Date of Birth/Sex: Treating RN: 05/15/1946 (77 y.o. M) Primary Care Waverley Krempasky: [name] Other Clinician: Referring Eliyana Pagliaro: Treating Moe Brier/Extender: Leveda Anna, [name] Weeks in Treatment: 38 Wound Status Wound Number: 7 Primary Lymphedema Etiology: Wound Location: Left, Medial Lower Leg Wound Open Wounding Event: Gradually Appeared Status: Date Acquired: 03/05/2022 Comorbid Anemia, Lymphedema, Deep Vein Thrombosis, Hypertension, Weeks Of Treatment: 44 History: Received Radiation Clustered Wound: Yes Photos Strandberg, [name] ([id number]) [id number] Page 11 of 12 Wound Measurements Length: (cm) Width: (cm) Depth: (cm) Clustered Quantity: Area: (cm) Volume: (cm) 3 % Reduction in Area: 75% 5 % Reduction in Volume: 50% 0.2 Epithelialization: Small (1-33%) 1 Tunneling: No 11.781 Undermining: No 2.356 Wound Description Classification: Full Thickness Without Exposed Sup Wound Margin: Distinct, outline attached Exudate  Amount: Large Exudate Type: Serous Exudate Color: amber port Structures Foul Odor After Cleansing: No Slough/Fibrino Yes Wound Bed Granulation Amount: Large (67-100%) Exposed Structure Granulation Quality: Pink Fascia Exposed: No Necrotic Amount: Small (1-33%) Fat Layer (Subcutaneous Tissue) Exposed: Yes Necrotic Quality: Adherent Slough Tendon Exposed: No Muscle Exposed: No Joint Exposed: No Bone Exposed: No Periwound Skin Texture Texture Color No Abnormalities Noted: No No Abnormalities Noted: No Callus: No Atrophie Blanche: No Crepitus: No Cyanosis: No Excoriation: No Ecchymosis: No Induration: No Erythema: No Rash: No Hemosiderin Staining: Yes Scarring: Yes Mottled: No Pallor: No Moisture Rubor: No No Abnormalities Noted: No Dry / Scaly: No Temperature / Pain Maceration: No Temperature: No Abnormality Treatment Notes Wound #7 (Lower Leg) Wound Laterality: Left, Medial Cleanser Soap and Water Discharge Instruction: May shower and wash wound with dial antibacterial soap and water prior to dressing change. Peri-Wound Care Sween Lotion (Moisturizing lotion) Discharge Instruction: Apply moisturizing lotion as directed Topical Primary Dressing Aquacel Ag Discharge Instruction: apply directly to wound bed. Secondary Dressing ABD Pad, 8x10 Discharge Instruction: Apply over primary dressing as directed. OptiLock Super Absorbent, 5x5.5 (in/in) Discharge Instruction: Apply directly to wound bed as directed Leano, Roxas ([id number]) [phone].pdf Page 12 of 12 Secured With Compression Wrap Urgo K2, (equivalent to a 4 layer) two layer compression system, regular Discharge Instruction: Apply Urgo K2 as directed (alternative to 4 layer compression). Compression Stockings Add-Ons Electronic Signature(s) Signed: 01/28/2023 5:54:20 PM By: [name] RN, [name] Entered By: [name] on 01/28/2023  15:38:07 -------------------------------------------------------------------------------- Vitals Details Patient Name: Date of Service: Cressman,  Blanche: No Crepitus: No Cyanosis: No Excoriation: No Ecchymosis: No Induration: No Erythema: No Rash: No Hemosiderin Staining: Yes Scarring: Yes Mottled: No Pallor: No Moisture Rubor: No No Abnormalities Noted: No Dry / Scaly: No Temperature / Pain Maceration: No Temperature: No Abnormality Treatment Notes Wound #6 (Lower Leg) Wound Laterality: Right, Medial Cleanser Soap and Water Discharge Instruction: May shower and wash wound with dial antibacterial soap and water prior to dressing change. Peri-Wound Care Sween Lotion (Moisturizing lotion) Discharge Instruction:  Apply moisturizing lotion as directed Topical Primary Dressing Aquacel Ag Discharge Instruction: apply directly to wound bed. Secondary Dressing ABD Pad, 8x10 Discharge Instruction: Apply over primary dressing as directed. OptiLock Super Absorbent, 5x5.5 (in/in) Discharge Instruction: Apply directly to wound bed as directed Secured With Compression Wrap Urgo K2, (equivalent to a 4 layer) two layer compression system, regular Discharge Instruction: Apply Urgo K2 as directed (alternative to 4 layer compression). Compression Stockings Add-Ons Electronic Signature(s) Signed: 01/28/2023 5:54:20 PM By: [name] RN, [name] Entered By: [name] on 01/28/2023 15:37:52 -------------------------------------------------------------------------------- Wound Assessment Details Patient Name: Date of Service: Cotrell, [name] 01/28/2023 2:45 PM Medical Record Number: [id number] Patient Account Number: [id number] Date of Birth/Sex: Treating RN: 05/15/1946 (77 y.o. M) Primary Care Waverley Krempasky: [name] Other Clinician: Referring Eliyana Pagliaro: Treating Moe Brier/Extender: Leveda Anna, [name] Weeks in Treatment: 38 Wound Status Wound Number: 7 Primary Lymphedema Etiology: Wound Location: Left, Medial Lower Leg Wound Open Wounding Event: Gradually Appeared Status: Date Acquired: 03/05/2022 Comorbid Anemia, Lymphedema, Deep Vein Thrombosis, Hypertension, Weeks Of Treatment: 44 History: Received Radiation Clustered Wound: Yes Photos Strandberg, [name] ([id number]) [id number] Page 11 of 12 Wound Measurements Length: (cm) Width: (cm) Depth: (cm) Clustered Quantity: Area: (cm) Volume: (cm) 3 % Reduction in Area: 75% 5 % Reduction in Volume: 50% 0.2 Epithelialization: Small (1-33%) 1 Tunneling: No 11.781 Undermining: No 2.356 Wound Description Classification: Full Thickness Without Exposed Sup Wound Margin: Distinct, outline attached Exudate  Amount: Large Exudate Type: Serous Exudate Color: amber port Structures Foul Odor After Cleansing: No Slough/Fibrino Yes Wound Bed Granulation Amount: Large (67-100%) Exposed Structure Granulation Quality: Pink Fascia Exposed: No Necrotic Amount: Small (1-33%) Fat Layer (Subcutaneous Tissue) Exposed: Yes Necrotic Quality: Adherent Slough Tendon Exposed: No Muscle Exposed: No Joint Exposed: No Bone Exposed: No Periwound Skin Texture Texture Color No Abnormalities Noted: No No Abnormalities Noted: No Callus: No Atrophie Blanche: No Crepitus: No Cyanosis: No Excoriation: No Ecchymosis: No Induration: No Erythema: No Rash: No Hemosiderin Staining: Yes Scarring: Yes Mottled: No Pallor: No Moisture Rubor: No No Abnormalities Noted: No Dry / Scaly: No Temperature / Pain Maceration: No Temperature: No Abnormality Treatment Notes Wound #7 (Lower Leg) Wound Laterality: Left, Medial Cleanser Soap and Water Discharge Instruction: May shower and wash wound with dial antibacterial soap and water prior to dressing change. Peri-Wound Care Sween Lotion (Moisturizing lotion) Discharge Instruction: Apply moisturizing lotion as directed Topical Primary Dressing Aquacel Ag Discharge Instruction: apply directly to wound bed. Secondary Dressing ABD Pad, 8x10 Discharge Instruction: Apply over primary dressing as directed. OptiLock Super Absorbent, 5x5.5 (in/in) Discharge Instruction: Apply directly to wound bed as directed Leano, Roxas ([id number]) [phone].pdf Page 12 of 12 Secured With Compression Wrap Urgo K2, (equivalent to a 4 layer) two layer compression system, regular Discharge Instruction: Apply Urgo K2 as directed (alternative to 4 layer compression). Compression Stockings Add-Ons Electronic Signature(s) Signed: 01/28/2023 5:54:20 PM By: [name] RN, [name] Entered By: [name] on 01/28/2023  15:38:07 -------------------------------------------------------------------------------- Vitals Details Patient Name: Date of Service: Cressman,  Robarge, [name] ([id number]) [id number] Page 1 of 12 Visit Report for 01/28/2023 Arrival Information Details Patient Name: Date of Service: Colbaugh, [name] 01/28/2023 2:45 PM Medical Record Number: [id number] Patient Account Number: [id number] Date of Birth/Sex: Treating RN: May 23, 1945 (77 y.o. M) Primary Care Willowdean Luhmann: [name] Other Clinician: Referring Jaydis Duchene: Treating Maizee Reinhold/Extender: [name] in Treatment: 53 Visit Information History Since Last Visit All ordered tests and consults were completed: Yes Patient Arrived: Cane Added or deleted any medications: No Arrival Time: 14:45 Any new allergies or adverse reactions: No Accompanied By: Family Had a fall or experienced change in No Transfer Assistance: None activities of daily living that may affect Patient Identification Verified: Yes risk of falls: Secondary Verification Process Completed: Yes Signs or symptoms of abuse/neglect since last visito No Patient Requires Transmission-Based Precautions: No Hospitalized since last visit: No Patient Has Alerts: Yes Implantable device outside of the clinic excluding No Patient Alerts: Patient on Blood Thinner cellular tissue based products placed in the center Right ABI in clinic [place] since last visit: Pain Present Now: Yes Electronic Signature(s) Signed: 01/28/2023 3:05:02 PM By: Karl Bales EMT Entered By: Karl Bales on 01/28/2023 15:05:02 -------------------------------------------------------------------------------- Compression Therapy Details Patient Name: Date of Service: Billet, [name] 01/28/2023 2:45 PM Medical Record Number: [id number] Patient Account Number: [id number] Date of Birth/Sex: Treating RN: [birth year] (76 y.o. [name] Primary Care Datrell Dunton: [name] Other Clinician: Referring Jeannemarie Sawaya: Treating Farrell Pantaleo/Extender: [name] Weeks in Treatment:  56 Compression Therapy Performed for Wound Assessment: Wound #12 Left,Anterior Lower Leg Performed By: Clinician [name], RN Compression Type: Double Layer Post Procedure Diagnosis Same as Pre-procedure Electronic Signature(s) Signed: 01/28/2023 5:54:20 PM By: [name] RN, [name] Entered By: [name] on 01/28/2023 16:03:28 Blatz, [name] ([id number]) [id number] Page 2 of 12 -------------------------------------------------------------------------------- Compression Therapy Details Patient Name: Date of Service: Harshman, [name] 01/28/2023 2:45 PM Medical Record Number: [id number] Patient Account Number: [id number] Date of Birth/Sex: Treating RN: 1945/11/18 (77 y.o. [name] Primary Care Khloe Hunkele: [name] Other Clinician: Referring Destin Vinsant: Treating Kirtis Challis/Extender: [name] Weeks in Treatment: 70 Compression Therapy Performed for Wound Assessment: Wound #7 Left,Medial Lower Leg Performed By: Clinician [name], RN Compression Type: Double Layer Post Procedure Diagnosis Same as Pre-procedure Electronic Signature(s) Signed: 01/28/2023 5:54:20 PM By: [name] RN, [name] Entered By: [name] on 01/28/2023 16:03:28 -------------------------------------------------------------------------------- Compression Therapy Details Patient Name: Date of Service: Zeek, [name] 01/28/2023 2:45 PM Medical Record Number: [id number] Patient Account Number: [id number] Date of Birth/Sex: Treating RN: [birth year] (76 y.o. [name] Primary Care Mukhtar Shams: [name] Other Clinician: Referring Neilani Duffee: Treating Erma Raiche/Extender: [name] Weeks in Treatment: 25 Compression Therapy Performed for Wound Assessment: Wound #6 Right,Medial Lower Leg Performed By: Clinician [name], RN Compression Type: Double Layer Post Procedure Diagnosis Same as  Pre-procedure Electronic Signature(s) Signed: 01/28/2023 5:54:20 PM By: [name] RN, [name] Entered By: [name] on 01/28/2023 16:03:28 -------------------------------------------------------------------------------- Compression Therapy Details Patient Name: Date of Service: Keetch, [name] 01/28/2023 2:45 PM Medical Record Number: [id number] Patient Account Number: [id number] Date of Birth/Sex: Treating RN: 03/30/1946 (76 y.o. [name] Primary Care Annis Lagoy: [name] Other Clinician: Referring Anthonia Monger: Treating Haadiya Frogge/Extender: [name] Weeks in Treatment: 24 Compression Therapy Performed for Wound Assessment: Wound #13 Left,Lateral Lower Leg Performed By: Clinician [name], RN Compression Type: Double Layer Post Procedure Diagnosis Same as  [name] 01/28/2023 2:45 PM Medical Record Number: [id number] Patient Account Number: [id number] Date of Birth/Sex: Treating RN: June 27, 1945 (77 y.o. M) Primary Care Cambell Rickenbach: [name] Other Clinician: Referring Marene Gilliam: Treating Myiah Petkus/Extender: [name] Weeks in Treatment: 65 Vital Signs Time Taken: 15:02 Temperature (F): 98.0 Height (in): 74 Pulse (bpm): 86 Weight (lbs): 250 Respiratory Rate (breaths/min): 18 Body Mass Index (BMI): 32.1 Blood Pressure (mmHg): 173/64 Reference Range: 80 - 120 mg / dl Electronic Signature(s) Signed: 01/28/2023 3:05:34 PM By: Karl Bales EMT Entered By: Karl Bales on 01/28/2023 15:05:34  Robarge, [name] ([id number]) [id number] Page 1 of 12 Visit Report for 01/28/2023 Arrival Information Details Patient Name: Date of Service: Colbaugh, [name] 01/28/2023 2:45 PM Medical Record Number: [id number] Patient Account Number: [id number] Date of Birth/Sex: Treating RN: May 23, 1945 (77 y.o. M) Primary Care Willowdean Luhmann: [name] Other Clinician: Referring Jaydis Duchene: Treating Maizee Reinhold/Extender: [name] in Treatment: 53 Visit Information History Since Last Visit All ordered tests and consults were completed: Yes Patient Arrived: Cane Added or deleted any medications: No Arrival Time: 14:45 Any new allergies or adverse reactions: No Accompanied By: Family Had a fall or experienced change in No Transfer Assistance: None activities of daily living that may affect Patient Identification Verified: Yes risk of falls: Secondary Verification Process Completed: Yes Signs or symptoms of abuse/neglect since last visito No Patient Requires Transmission-Based Precautions: No Hospitalized since last visit: No Patient Has Alerts: Yes Implantable device outside of the clinic excluding No Patient Alerts: Patient on Blood Thinner cellular tissue based products placed in the center Right ABI in clinic [place] since last visit: Pain Present Now: Yes Electronic Signature(s) Signed: 01/28/2023 3:05:02 PM By: Karl Bales EMT Entered By: Karl Bales on 01/28/2023 15:05:02 -------------------------------------------------------------------------------- Compression Therapy Details Patient Name: Date of Service: Billet, [name] 01/28/2023 2:45 PM Medical Record Number: [id number] Patient Account Number: [id number] Date of Birth/Sex: Treating RN: [birth year] (76 y.o. [name] Primary Care Datrell Dunton: [name] Other Clinician: Referring Jeannemarie Sawaya: Treating Farrell Pantaleo/Extender: [name] Weeks in Treatment:  56 Compression Therapy Performed for Wound Assessment: Wound #12 Left,Anterior Lower Leg Performed By: Clinician [name], RN Compression Type: Double Layer Post Procedure Diagnosis Same as Pre-procedure Electronic Signature(s) Signed: 01/28/2023 5:54:20 PM By: [name] RN, [name] Entered By: [name] on 01/28/2023 16:03:28 Blatz, [name] ([id number]) [id number] Page 2 of 12 -------------------------------------------------------------------------------- Compression Therapy Details Patient Name: Date of Service: Harshman, [name] 01/28/2023 2:45 PM Medical Record Number: [id number] Patient Account Number: [id number] Date of Birth/Sex: Treating RN: 1945/11/18 (77 y.o. [name] Primary Care Khloe Hunkele: [name] Other Clinician: Referring Destin Vinsant: Treating Kirtis Challis/Extender: [name] Weeks in Treatment: 70 Compression Therapy Performed for Wound Assessment: Wound #7 Left,Medial Lower Leg Performed By: Clinician [name], RN Compression Type: Double Layer Post Procedure Diagnosis Same as Pre-procedure Electronic Signature(s) Signed: 01/28/2023 5:54:20 PM By: [name] RN, [name] Entered By: [name] on 01/28/2023 16:03:28 -------------------------------------------------------------------------------- Compression Therapy Details Patient Name: Date of Service: Zeek, [name] 01/28/2023 2:45 PM Medical Record Number: [id number] Patient Account Number: [id number] Date of Birth/Sex: Treating RN: [birth year] (76 y.o. [name] Primary Care Mukhtar Shams: [name] Other Clinician: Referring Neilani Duffee: Treating Erma Raiche/Extender: [name] Weeks in Treatment: 25 Compression Therapy Performed for Wound Assessment: Wound #6 Right,Medial Lower Leg Performed By: Clinician [name], RN Compression Type: Double Layer Post Procedure Diagnosis Same as  Pre-procedure Electronic Signature(s) Signed: 01/28/2023 5:54:20 PM By: [name] RN, [name] Entered By: [name] on 01/28/2023 16:03:28 -------------------------------------------------------------------------------- Compression Therapy Details Patient Name: Date of Service: Keetch, [name] 01/28/2023 2:45 PM Medical Record Number: [id number] Patient Account Number: [id number] Date of Birth/Sex: Treating RN: 03/30/1946 (76 y.o. [name] Primary Care Annis Lagoy: [name] Other Clinician: Referring Anthonia Monger: Treating Haadiya Frogge/Extender: [name] Weeks in Treatment: 24 Compression Therapy Performed for Wound Assessment: Wound #13 Left,Lateral Lower Leg Performed By: Clinician [name], RN Compression Type: Double Layer Post Procedure Diagnosis Same as

## 2023-02-04 ENCOUNTER — Encounter (HOSPITAL_BASED_OUTPATIENT_CLINIC_OR_DEPARTMENT_OTHER): Payer: Medicare Other | Admitting: Physician Assistant

## 2023-02-04 DIAGNOSIS — I87333 Chronic venous hypertension (idiopathic) with ulcer and inflammation of bilateral lower extremity: Secondary | ICD-10-CM | POA: Diagnosis not present

## 2023-02-04 NOTE — Progress Notes (Addendum)
and overall I do not know exactly what he did different other than he tells me that he "has been taking his fluid pills every day instead of every other day. Obviously this is needed. I was under the understanding previously that he was taking this every day but I think he may also not been pumping every day like he was telling me but I cannot be sure this either way he does seem to be doing what he supposed to do at this point. Electronic Signature(s) Signed: 02/04/2023 2:26:26 PM By: [name] PA-C Entered By: [name] on 02/04/2023 14:26:26 -------------------------------------------------------------------------------- Physical Exam Details Patient Name: Date of Service: Nicholls, [name] 02/04/2023 2:00 PM Medical Record Number: [id number] Patient Account Number: [id number] Date of Birth/Sex: Treating RN: February 16, 1946 (77 y.o. M) Primary Care Provider: [name] Other Clinician: Referring Provider: Treating Provider/Extender: [name] Weeks in Treatment: 45 Constitutional Obese and well-hydrated in no acute distress. Respiratory normal breathing without difficulty. Psychiatric this patient is able to make decisions and demonstrates good insight into disease process. Alert and Oriented x 3. pleasant and cooperative. Notes Upon inspection patient's wound bed actually showed signs of good granulation and epithelization at this point. Fortunately there does not appear to be any signs of infection locally or systemically which is great news and in general I do believe that we are making headway towards complete closure which is good news.  This is actually one of the first weeks that have seen some really improvement. Again he does sound like he is taken at the heart my conversation with him last week taking the medicine on a regular basis which is definitely something that appears to have benefited him as far as the fluid control is concerned. Electronic Signature(s) Signed: 02/04/2023 2:26:49 PM By: [name] PA-C Entered By: [name] on 02/04/2023 14:26:48 Meiners, [name] ([id number]) [id number] Page 5 of 13 -------------------------------------------------------------------------------- Physician Orders Details Patient Name: Date of Service: Muto, [name] 02/04/2023 2:00 PM Medical Record Number: [id number] Patient Account Number: [id number] Date of Birth/Sex: Treating RN: 06-17-1945 (77 y.o. [name]: [name] Other Clinician: Referring Provider: Treating Provider/Extender: [name] in Treatment: 50 Verbal / Phone Orders: No Diagnosis Coding ICD-10 Coding Code Description [phone] Chronic venous hypertension (idiopathic) with ulcer and inflammation of bilateral lower extremity I89.0 Lymphedema, not elsewhere classified 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 other part of right lower leg with other specified severity L84 Corns and callosities Follow-up Appointments ppointment in 1 week. [name] PA Wednesday ****extra time 60 minutes**** Return A 02/11/2023 2pm ppointment in 2 weeks. [name] PA (already scheduled) Return A Return appointment in 3 weeks. - Please see front desk to schedule appointment Return appointment in 1 month. - Please see front desk to schedule appointment Other: - Referral wound care at [facility]- they will call you to schedule the appointment. Anesthetic (In clinic) Topical  Lidocaine 5% applied to wound bed Bathing/ Shower/ Hygiene May shower with protection but do not get wound dressing(s) wet. Protect dressing(s) with water repellant cover (for example, large plastic bag) or a cast cover and may then take shower. Edema Control - Lymphedema / SCD / Other Lymphedema Pumps. Use Lymphedema pumps on leg(s) 2-3 times a day for 45-60 minutes. If wearing any wraps or hose, do not remove them. Continue exercising  Minniefield, [name] [id number] Page 1 of 13 Visit Report for 02/04/2023 Chief Complaint Document Details Patient Name: Date of Service: Kilman, [name] 02/04/2023 2:00 PM Medical Record Number: [id number] Patient Account Number: [id number] Date of Birth/Sex: Treating RN: 1946-04-05 (77 y.o. M) Primary Care Provider: [name] Other Clinician: Referring Provider: Treating Provider/Extender: [name] Weeks in Treatment: 36 Information Obtained from: Patient Chief Complaint 02/23/2020; patient is here for wounds on his bilateral lower legs in the setting of severe lymphedema 03/26/2022; patient is here for wounds on his bilateral lower legs medial aspect Electronic Signature(s) Signed: 02/04/2023 1:36:05 PM By: [name] PA-C Entered By: [name] on 02/04/2023 13:36:05 -------------------------------------------------------------------------------- HPI Details Patient Name: Date of Service: Mccleod, [name] 02/04/2023 2:00 PM Medical Record Number: [id number] Patient Account Number: [id number] Date of Birth/Sex: Treating RN: 08/07/1945 (77 y.o. M) Primary Care Provider: [name] Other Clinician: Referring Provider: Treating Provider/Extender: [name] Weeks in Treatment: 31 History of Present Illness HPI Description: ADMISSION 02/23/2020 Patient is a 77 year old man who lives in [place] who arrives accompanied by his wife. He has a history of chronic lymphedema and venous insufficiency in his bilateral lower legs which may have something to do that with having a history of DVT as well as being treated for prostate cancer. In any case he recently got compression pumps at home but compliance has been an issue here. He has compression stockings however they are probably not sufficient enough to control swelling. They tell us that things deteriorated for him in late August he was admitted  to [facility] for 7 days. This was with cellulitis I think of his bilateral lower legs. Discharge he was noted to have wounds on his bilateral lower legs. He was discharged on Bactrim. They tried to get him home health through [facility] of course they declined him. His wife is been wrapping these applying some form of silver foam dressing. He has a history of wounds before although nothing that would not heal with basic home topical dressings. He has 2 areas on the left medial, left anterior and left lateral and a smaller area on the right medial. All of these have considerable depth. Past medical history includes iron deficiency anemia, lymphedema followed by the rehab center at [facility] with lymphedema wraps I believe, DVT on chronic anticoagulation, prostate cancer, chronic venous insufficiency, hypertension. As mentioned he has compression pumps but does not use them. ABIs in our clinic were noncompressible bilaterally 10/14; patient with severe bilateral lymphedema right greater than left. He came in with bilateral lower extremity wounds left greater than right. Even though the right side has more of the edema most of the wounds here almost closed on the right medial. He has 3 remaining wounds on the left We have been using silver alginate under 4-layer compression I have been trying to get him to be compliant with his external compression pumps 10/21; patient with 3 small wounds on the left leg and 1 on the right medial in the setting of severe lymphedema and chronic venous insufficiency. We have been using silver alginate under 4-layer compression he is using his external compression pumps twice a day 11/4; ARTERIAL STUDIES on the right show an ABI of 1.02 TBI of 0.858 with biphasic waveforms on the left 0.98 with a TBI of 0.55 and biphasic waveforms. Does not look like he has significant arterial disease. We are treating him for  Minniefield, [name] [id number] Page 1 of 13 Visit Report for 02/04/2023 Chief Complaint Document Details Patient Name: Date of Service: Kilman, [name] 02/04/2023 2:00 PM Medical Record Number: [id number] Patient Account Number: [id number] Date of Birth/Sex: Treating RN: 1946-04-05 (77 y.o. M) Primary Care Provider: [name] Other Clinician: Referring Provider: Treating Provider/Extender: [name] Weeks in Treatment: 36 Information Obtained from: Patient Chief Complaint 02/23/2020; patient is here for wounds on his bilateral lower legs in the setting of severe lymphedema 03/26/2022; patient is here for wounds on his bilateral lower legs medial aspect Electronic Signature(s) Signed: 02/04/2023 1:36:05 PM By: [name] PA-C Entered By: [name] on 02/04/2023 13:36:05 -------------------------------------------------------------------------------- HPI Details Patient Name: Date of Service: Mccleod, [name] 02/04/2023 2:00 PM Medical Record Number: [id number] Patient Account Number: [id number] Date of Birth/Sex: Treating RN: 08/07/1945 (77 y.o. M) Primary Care Provider: [name] Other Clinician: Referring Provider: Treating Provider/Extender: [name] Weeks in Treatment: 31 History of Present Illness HPI Description: ADMISSION 02/23/2020 Patient is a 77 year old man who lives in [place] who arrives accompanied by his wife. He has a history of chronic lymphedema and venous insufficiency in his bilateral lower legs which may have something to do that with having a history of DVT as well as being treated for prostate cancer. In any case he recently got compression pumps at home but compliance has been an issue here. He has compression stockings however they are probably not sufficient enough to control swelling. They tell us that things deteriorated for him in late August he was admitted  to [facility] for 7 days. This was with cellulitis I think of his bilateral lower legs. Discharge he was noted to have wounds on his bilateral lower legs. He was discharged on Bactrim. They tried to get him home health through [facility] of course they declined him. His wife is been wrapping these applying some form of silver foam dressing. He has a history of wounds before although nothing that would not heal with basic home topical dressings. He has 2 areas on the left medial, left anterior and left lateral and a smaller area on the right medial. All of these have considerable depth. Past medical history includes iron deficiency anemia, lymphedema followed by the rehab center at [facility] with lymphedema wraps I believe, DVT on chronic anticoagulation, prostate cancer, chronic venous insufficiency, hypertension. As mentioned he has compression pumps but does not use them. ABIs in our clinic were noncompressible bilaterally 10/14; patient with severe bilateral lymphedema right greater than left. He came in with bilateral lower extremity wounds left greater than right. Even though the right side has more of the edema most of the wounds here almost closed on the right medial. He has 3 remaining wounds on the left We have been using silver alginate under 4-layer compression I have been trying to get him to be compliant with his external compression pumps 10/21; patient with 3 small wounds on the left leg and 1 on the right medial in the setting of severe lymphedema and chronic venous insufficiency. We have been using silver alginate under 4-layer compression he is using his external compression pumps twice a day 11/4; ARTERIAL STUDIES on the right show an ABI of 1.02 TBI of 0.858 with biphasic waveforms on the left 0.98 with a TBI of 0.55 and biphasic waveforms. Does not look like he has significant arterial disease. We are treating him for  Minniefield, [name] [id number] Page 1 of 13 Visit Report for 02/04/2023 Chief Complaint Document Details Patient Name: Date of Service: Kilman, [name] 02/04/2023 2:00 PM Medical Record Number: [id number] Patient Account Number: [id number] Date of Birth/Sex: Treating RN: 1946-04-05 (77 y.o. M) Primary Care Provider: [name] Other Clinician: Referring Provider: Treating Provider/Extender: [name] Weeks in Treatment: 36 Information Obtained from: Patient Chief Complaint 02/23/2020; patient is here for wounds on his bilateral lower legs in the setting of severe lymphedema 03/26/2022; patient is here for wounds on his bilateral lower legs medial aspect Electronic Signature(s) Signed: 02/04/2023 1:36:05 PM By: [name] PA-C Entered By: [name] on 02/04/2023 13:36:05 -------------------------------------------------------------------------------- HPI Details Patient Name: Date of Service: Mccleod, [name] 02/04/2023 2:00 PM Medical Record Number: [id number] Patient Account Number: [id number] Date of Birth/Sex: Treating RN: 08/07/1945 (77 y.o. M) Primary Care Provider: [name] Other Clinician: Referring Provider: Treating Provider/Extender: [name] Weeks in Treatment: 31 History of Present Illness HPI Description: ADMISSION 02/23/2020 Patient is a 77 year old man who lives in [place] who arrives accompanied by his wife. He has a history of chronic lymphedema and venous insufficiency in his bilateral lower legs which may have something to do that with having a history of DVT as well as being treated for prostate cancer. In any case he recently got compression pumps at home but compliance has been an issue here. He has compression stockings however they are probably not sufficient enough to control swelling. They tell us that things deteriorated for him in late August he was admitted  to [facility] for 7 days. This was with cellulitis I think of his bilateral lower legs. Discharge he was noted to have wounds on his bilateral lower legs. He was discharged on Bactrim. They tried to get him home health through [facility] of course they declined him. His wife is been wrapping these applying some form of silver foam dressing. He has a history of wounds before although nothing that would not heal with basic home topical dressings. He has 2 areas on the left medial, left anterior and left lateral and a smaller area on the right medial. All of these have considerable depth. Past medical history includes iron deficiency anemia, lymphedema followed by the rehab center at [facility] with lymphedema wraps I believe, DVT on chronic anticoagulation, prostate cancer, chronic venous insufficiency, hypertension. As mentioned he has compression pumps but does not use them. ABIs in our clinic were noncompressible bilaterally 10/14; patient with severe bilateral lymphedema right greater than left. He came in with bilateral lower extremity wounds left greater than right. Even though the right side has more of the edema most of the wounds here almost closed on the right medial. He has 3 remaining wounds on the left We have been using silver alginate under 4-layer compression I have been trying to get him to be compliant with his external compression pumps 10/21; patient with 3 small wounds on the left leg and 1 on the right medial in the setting of severe lymphedema and chronic venous insufficiency. We have been using silver alginate under 4-layer compression he is using his external compression pumps twice a day 11/4; ARTERIAL STUDIES on the right show an ABI of 1.02 TBI of 0.858 with biphasic waveforms on the left 0.98 with a TBI of 0.55 and biphasic waveforms. Does not look like he has significant arterial disease. We are treating him for  and overall I do not know exactly what he did different other than he tells me that he "has been taking his fluid pills every day instead of every other day. Obviously this is needed. I was under the understanding previously that he was taking this every day but I think he may also not been pumping every day like he was telling me but I cannot be sure this either way he does seem to be doing what he supposed to do at this point. Electronic Signature(s) Signed: 02/04/2023 2:26:26 PM By: [name] PA-C Entered By: [name] on 02/04/2023 14:26:26 -------------------------------------------------------------------------------- Physical Exam Details Patient Name: Date of Service: Nicholls, [name] 02/04/2023 2:00 PM Medical Record Number: [id number] Patient Account Number: [id number] Date of Birth/Sex: Treating RN: February 16, 1946 (77 y.o. M) Primary Care Provider: [name] Other Clinician: Referring Provider: Treating Provider/Extender: [name] Weeks in Treatment: 45 Constitutional Obese and well-hydrated in no acute distress. Respiratory normal breathing without difficulty. Psychiatric this patient is able to make decisions and demonstrates good insight into disease process. Alert and Oriented x 3. pleasant and cooperative. Notes Upon inspection patient's wound bed actually showed signs of good granulation and epithelization at this point. Fortunately there does not appear to be any signs of infection locally or systemically which is great news and in general I do believe that we are making headway towards complete closure which is good news.  This is actually one of the first weeks that have seen some really improvement. Again he does sound like he is taken at the heart my conversation with him last week taking the medicine on a regular basis which is definitely something that appears to have benefited him as far as the fluid control is concerned. Electronic Signature(s) Signed: 02/04/2023 2:26:49 PM By: [name] PA-C Entered By: [name] on 02/04/2023 14:26:48 Meiners, [name] ([id number]) [id number] Page 5 of 13 -------------------------------------------------------------------------------- Physician Orders Details Patient Name: Date of Service: Muto, [name] 02/04/2023 2:00 PM Medical Record Number: [id number] Patient Account Number: [id number] Date of Birth/Sex: Treating RN: 06-17-1945 (77 y.o. [name]: [name] Other Clinician: Referring Provider: Treating Provider/Extender: [name] in Treatment: 50 Verbal / Phone Orders: No Diagnosis Coding ICD-10 Coding Code Description [phone] Chronic venous hypertension (idiopathic) with ulcer and inflammation of bilateral lower extremity I89.0 Lymphedema, not elsewhere classified 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 other part of right lower leg with other specified severity L84 Corns and callosities Follow-up Appointments ppointment in 1 week. [name] PA Wednesday ****extra time 60 minutes**** Return A 02/11/2023 2pm ppointment in 2 weeks. [name] PA (already scheduled) Return A Return appointment in 3 weeks. - Please see front desk to schedule appointment Return appointment in 1 month. - Please see front desk to schedule appointment Other: - Referral wound care at [facility]- they will call you to schedule the appointment. Anesthetic (In clinic) Topical  Lidocaine 5% applied to wound bed Bathing/ Shower/ Hygiene May shower with protection but do not get wound dressing(s) wet. Protect dressing(s) with water repellant cover (for example, large plastic bag) or a cast cover and may then take shower. Edema Control - Lymphedema / SCD / Other Lymphedema Pumps. Use Lymphedema pumps on leg(s) 2-3 times a day for 45-60 minutes. If wearing any wraps or hose, do not remove them. Continue exercising  Minniefield, [name] [id number] Page 1 of 13 Visit Report for 02/04/2023 Chief Complaint Document Details Patient Name: Date of Service: Kilman, [name] 02/04/2023 2:00 PM Medical Record Number: [id number] Patient Account Number: [id number] Date of Birth/Sex: Treating RN: 1946-04-05 (77 y.o. M) Primary Care Provider: [name] Other Clinician: Referring Provider: Treating Provider/Extender: [name] Weeks in Treatment: 36 Information Obtained from: Patient Chief Complaint 02/23/2020; patient is here for wounds on his bilateral lower legs in the setting of severe lymphedema 03/26/2022; patient is here for wounds on his bilateral lower legs medial aspect Electronic Signature(s) Signed: 02/04/2023 1:36:05 PM By: [name] PA-C Entered By: [name] on 02/04/2023 13:36:05 -------------------------------------------------------------------------------- HPI Details Patient Name: Date of Service: Mccleod, [name] 02/04/2023 2:00 PM Medical Record Number: [id number] Patient Account Number: [id number] Date of Birth/Sex: Treating RN: 08/07/1945 (77 y.o. M) Primary Care Provider: [name] Other Clinician: Referring Provider: Treating Provider/Extender: [name] Weeks in Treatment: 31 History of Present Illness HPI Description: ADMISSION 02/23/2020 Patient is a 77 year old man who lives in [place] who arrives accompanied by his wife. He has a history of chronic lymphedema and venous insufficiency in his bilateral lower legs which may have something to do that with having a history of DVT as well as being treated for prostate cancer. In any case he recently got compression pumps at home but compliance has been an issue here. He has compression stockings however they are probably not sufficient enough to control swelling. They tell us that things deteriorated for him in late August he was admitted  to [facility] for 7 days. This was with cellulitis I think of his bilateral lower legs. Discharge he was noted to have wounds on his bilateral lower legs. He was discharged on Bactrim. They tried to get him home health through [facility] of course they declined him. His wife is been wrapping these applying some form of silver foam dressing. He has a history of wounds before although nothing that would not heal with basic home topical dressings. He has 2 areas on the left medial, left anterior and left lateral and a smaller area on the right medial. All of these have considerable depth. Past medical history includes iron deficiency anemia, lymphedema followed by the rehab center at [facility] with lymphedema wraps I believe, DVT on chronic anticoagulation, prostate cancer, chronic venous insufficiency, hypertension. As mentioned he has compression pumps but does not use them. ABIs in our clinic were noncompressible bilaterally 10/14; patient with severe bilateral lymphedema right greater than left. He came in with bilateral lower extremity wounds left greater than right. Even though the right side has more of the edema most of the wounds here almost closed on the right medial. He has 3 remaining wounds on the left We have been using silver alginate under 4-layer compression I have been trying to get him to be compliant with his external compression pumps 10/21; patient with 3 small wounds on the left leg and 1 on the right medial in the setting of severe lymphedema and chronic venous insufficiency. We have been using silver alginate under 4-layer compression he is using his external compression pumps twice a day 11/4; ARTERIAL STUDIES on the right show an ABI of 1.02 TBI of 0.858 with biphasic waveforms on the left 0.98 with a TBI of 0.55 and biphasic waveforms. Does not look like he has significant arterial disease. We are treating him for  and overall I do not know exactly what he did different other than he tells me that he "has been taking his fluid pills every day instead of every other day. Obviously this is needed. I was under the understanding previously that he was taking this every day but I think he may also not been pumping every day like he was telling me but I cannot be sure this either way he does seem to be doing what he supposed to do at this point. Electronic Signature(s) Signed: 02/04/2023 2:26:26 PM By: [name] PA-C Entered By: [name] on 02/04/2023 14:26:26 -------------------------------------------------------------------------------- Physical Exam Details Patient Name: Date of Service: Nicholls, [name] 02/04/2023 2:00 PM Medical Record Number: [id number] Patient Account Number: [id number] Date of Birth/Sex: Treating RN: February 16, 1946 (77 y.o. M) Primary Care Provider: [name] Other Clinician: Referring Provider: Treating Provider/Extender: [name] Weeks in Treatment: 45 Constitutional Obese and well-hydrated in no acute distress. Respiratory normal breathing without difficulty. Psychiatric this patient is able to make decisions and demonstrates good insight into disease process. Alert and Oriented x 3. pleasant and cooperative. Notes Upon inspection patient's wound bed actually showed signs of good granulation and epithelization at this point. Fortunately there does not appear to be any signs of infection locally or systemically which is great news and in general I do believe that we are making headway towards complete closure which is good news.  This is actually one of the first weeks that have seen some really improvement. Again he does sound like he is taken at the heart my conversation with him last week taking the medicine on a regular basis which is definitely something that appears to have benefited him as far as the fluid control is concerned. Electronic Signature(s) Signed: 02/04/2023 2:26:49 PM By: [name] PA-C Entered By: [name] on 02/04/2023 14:26:48 Meiners, [name] ([id number]) [id number] Page 5 of 13 -------------------------------------------------------------------------------- Physician Orders Details Patient Name: Date of Service: Muto, [name] 02/04/2023 2:00 PM Medical Record Number: [id number] Patient Account Number: [id number] Date of Birth/Sex: Treating RN: 06-17-1945 (77 y.o. [name]: [name] Other Clinician: Referring Provider: Treating Provider/Extender: [name] in Treatment: 50 Verbal / Phone Orders: No Diagnosis Coding ICD-10 Coding Code Description [phone] Chronic venous hypertension (idiopathic) with ulcer and inflammation of bilateral lower extremity I89.0 Lymphedema, not elsewhere classified 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 other part of right lower leg with other specified severity L84 Corns and callosities Follow-up Appointments ppointment in 1 week. [name] PA Wednesday ****extra time 60 minutes**** Return A 02/11/2023 2pm ppointment in 2 weeks. [name] PA (already scheduled) Return A Return appointment in 3 weeks. - Please see front desk to schedule appointment Return appointment in 1 month. - Please see front desk to schedule appointment Other: - Referral wound care at [facility]- they will call you to schedule the appointment. Anesthetic (In clinic) Topical  Lidocaine 5% applied to wound bed Bathing/ Shower/ Hygiene May shower with protection but do not get wound dressing(s) wet. Protect dressing(s) with water repellant cover (for example, large plastic bag) or a cast cover and may then take shower. Edema Control - Lymphedema / SCD / Other Lymphedema Pumps. Use Lymphedema pumps on leg(s) 2-3 times a day for 45-60 minutes. If wearing any wraps or hose, do not remove them. Continue exercising  directed Com pression Wrap: Urgo K2, (equivalent to a 4 layer) two layer compression system, regular 1 x Per Week/30 Days Discharge Instructions: Apply Urgo K2 as directed (alternative to 4 layer compression). WOUND #7: - Lower Leg Wound Laterality: Left, Medial Cleanser: Soap and Water 1 x Per Week/30 Days Discharge Instructions: May shower and wash wound with dial antibacterial soap and water prior to dressing change. Peri-Wound Care: Sween Lotion (Moisturizing lotion) 1 x Per Week/30 Days Discharge Instructions: Apply moisturizing lotion as directed Prim Dressing: Aquacel Ag 1 x Per Week/30 Days ary Discharge Instructions: apply directly to wound bed. Secondary Dressing: ABD Pad, 8x10 1 x Per Week/30 Days Discharge Instructions: Apply over  primary dressing as directed. Secondary Dressing: OptiLock Super Absorbent, 5x5.5 (in/in) (Generic) 1 x Per Week/30 Days Discharge Instructions: Apply directly to wound bed as directed Com pression Wrap: Urgo K2, (equivalent to a 4 layer) two layer compression system, regular 1 x Per Week/30 Days Discharge Instructions: Apply Urgo K2 as directed (alternative to 4 layer compression). Klett, [name] ([id number]) [id number] Page 13 of 13 1. I would recommend that we have the patient continue to monitor for any signs of infection or worsening. Based on what I am seeing I do believe that we will make an excellent headway towards complete closure which is great news. 2. I would recommend as well that the patient should continue to monitor for any signs of infection or worsening. Based on what I am seeing I think were making really good progress towards closure and hopefully he will continue to show signs of improvement going forward week by week we will see where things stand next week. We will see patient back for reevaluation in 1 week here in the clinic. If anything worsens or changes patient will contact our office for additional recommendations. Electronic Signature(s) Signed: 02/04/2023 8:55:50 PM By: [name] RN, [name] Signed: 02/06/2023 12:49:56 PM By: [name] PA-C Previous Signature: 02/04/2023 2:27:24 PM Version By: [name] PA-C Entered By: [name] on 02/04/2023 20:53:50 -------------------------------------------------------------------------------- SuperBill Details Patient Name: Date of Service: Stroud, [name] 02/04/2023 Medical Record Number: [id number] Patient Account Number: [id number] Date of Birth/Sex: Treating RN: Mar 02, 1946 (77 y.o. 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 ICD-10 Diagnosis Description I87.333 Chronic venous hypertension (idiopathic) with ulcer and inflammation of bilateral lower extre  I89.0 Lymphedema, not elsewhere classified 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 other part of right lower leg with other specified severity Modifier: mity Quantity: 1 Physician Procedures : CPT4 Code Description Modifier [id number] - WC PHYS LEVEL 3 - EST PT ICD-10 Diagnosis Description I87.333 Chronic venous hypertension (idiopathic) with ulcer and inflammation of bilateral lower extremity I89.0 Lymphedema, not elsewhere classified  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 other part of right  Minniefield, [name] [id number] Page 1 of 13 Visit Report for 02/04/2023 Chief Complaint Document Details Patient Name: Date of Service: Kilman, [name] 02/04/2023 2:00 PM Medical Record Number: [id number] Patient Account Number: [id number] Date of Birth/Sex: Treating RN: 1946-04-05 (77 y.o. M) Primary Care Provider: [name] Other Clinician: Referring Provider: Treating Provider/Extender: [name] Weeks in Treatment: 36 Information Obtained from: Patient Chief Complaint 02/23/2020; patient is here for wounds on his bilateral lower legs in the setting of severe lymphedema 03/26/2022; patient is here for wounds on his bilateral lower legs medial aspect Electronic Signature(s) Signed: 02/04/2023 1:36:05 PM By: [name] PA-C Entered By: [name] on 02/04/2023 13:36:05 -------------------------------------------------------------------------------- HPI Details Patient Name: Date of Service: Mccleod, [name] 02/04/2023 2:00 PM Medical Record Number: [id number] Patient Account Number: [id number] Date of Birth/Sex: Treating RN: 08/07/1945 (77 y.o. M) Primary Care Provider: [name] Other Clinician: Referring Provider: Treating Provider/Extender: [name] Weeks in Treatment: 31 History of Present Illness HPI Description: ADMISSION 02/23/2020 Patient is a 77 year old man who lives in [place] who arrives accompanied by his wife. He has a history of chronic lymphedema and venous insufficiency in his bilateral lower legs which may have something to do that with having a history of DVT as well as being treated for prostate cancer. In any case he recently got compression pumps at home but compliance has been an issue here. He has compression stockings however they are probably not sufficient enough to control swelling. They tell us that things deteriorated for him in late August he was admitted  to [facility] for 7 days. This was with cellulitis I think of his bilateral lower legs. Discharge he was noted to have wounds on his bilateral lower legs. He was discharged on Bactrim. They tried to get him home health through [facility] of course they declined him. His wife is been wrapping these applying some form of silver foam dressing. He has a history of wounds before although nothing that would not heal with basic home topical dressings. He has 2 areas on the left medial, left anterior and left lateral and a smaller area on the right medial. All of these have considerable depth. Past medical history includes iron deficiency anemia, lymphedema followed by the rehab center at [facility] with lymphedema wraps I believe, DVT on chronic anticoagulation, prostate cancer, chronic venous insufficiency, hypertension. As mentioned he has compression pumps but does not use them. ABIs in our clinic were noncompressible bilaterally 10/14; patient with severe bilateral lymphedema right greater than left. He came in with bilateral lower extremity wounds left greater than right. Even though the right side has more of the edema most of the wounds here almost closed on the right medial. He has 3 remaining wounds on the left We have been using silver alginate under 4-layer compression I have been trying to get him to be compliant with his external compression pumps 10/21; patient with 3 small wounds on the left leg and 1 on the right medial in the setting of severe lymphedema and chronic venous insufficiency. We have been using silver alginate under 4-layer compression he is using his external compression pumps twice a day 11/4; ARTERIAL STUDIES on the right show an ABI of 1.02 TBI of 0.858 with biphasic waveforms on the left 0.98 with a TBI of 0.55 and biphasic waveforms. Does not look like he has significant arterial disease. We are treating him for  directed Com pression Wrap: Urgo K2, (equivalent to a 4 layer) two layer compression system, regular 1 x Per Week/30 Days Discharge Instructions: Apply Urgo K2 as directed (alternative to 4 layer compression). WOUND #7: - Lower Leg Wound Laterality: Left, Medial Cleanser: Soap and Water 1 x Per Week/30 Days Discharge Instructions: May shower and wash wound with dial antibacterial soap and water prior to dressing change. Peri-Wound Care: Sween Lotion (Moisturizing lotion) 1 x Per Week/30 Days Discharge Instructions: Apply moisturizing lotion as directed Prim Dressing: Aquacel Ag 1 x Per Week/30 Days ary Discharge Instructions: apply directly to wound bed. Secondary Dressing: ABD Pad, 8x10 1 x Per Week/30 Days Discharge Instructions: Apply over  primary dressing as directed. Secondary Dressing: OptiLock Super Absorbent, 5x5.5 (in/in) (Generic) 1 x Per Week/30 Days Discharge Instructions: Apply directly to wound bed as directed Com pression Wrap: Urgo K2, (equivalent to a 4 layer) two layer compression system, regular 1 x Per Week/30 Days Discharge Instructions: Apply Urgo K2 as directed (alternative to 4 layer compression). Klett, [name] ([id number]) [id number] Page 13 of 13 1. I would recommend that we have the patient continue to monitor for any signs of infection or worsening. Based on what I am seeing I do believe that we will make an excellent headway towards complete closure which is great news. 2. I would recommend as well that the patient should continue to monitor for any signs of infection or worsening. Based on what I am seeing I think were making really good progress towards closure and hopefully he will continue to show signs of improvement going forward week by week we will see where things stand next week. We will see patient back for reevaluation in 1 week here in the clinic. If anything worsens or changes patient will contact our office for additional recommendations. Electronic Signature(s) Signed: 02/04/2023 8:55:50 PM By: [name] RN, [name] Signed: 02/06/2023 12:49:56 PM By: [name] PA-C Previous Signature: 02/04/2023 2:27:24 PM Version By: [name] PA-C Entered By: [name] on 02/04/2023 20:53:50 -------------------------------------------------------------------------------- SuperBill Details Patient Name: Date of Service: Stroud, [name] 02/04/2023 Medical Record Number: [id number] Patient Account Number: [id number] Date of Birth/Sex: Treating RN: Mar 02, 1946 (77 y.o. 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 ICD-10 Diagnosis Description I87.333 Chronic venous hypertension (idiopathic) with ulcer and inflammation of bilateral lower extre  I89.0 Lymphedema, not elsewhere classified 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 other part of right lower leg with other specified severity Modifier: mity Quantity: 1 Physician Procedures : CPT4 Code Description Modifier [id number] 99213 - WC PHYS LEVEL 3 - EST PT ICD-10 Diagnosis Description I87.333 Chronic venous hypertension (idiopathic) with ulcer and inflammation of bilateral lower extremity I89.0 Lymphedema, not elsewhere classified  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 other part of right  of bilateral lower extremity) Electronic Signature(s) Signed: 02/04/2023 3:13:57 PM By: [name] RN Signed: 02/04/2023 3:48:11 PM By: [name] PA-C Previous Signature: 02/04/2023 2:48:04 PM Version By: [name] RN Entered By: [name] on 02/04/2023 15:02:56 Prescription 02/04/2023 -------------------------------------------------------------------------------- Maclaren, Oley Balm PA Patient Name: Provider: 03-Sep-1945 [id number] Date of Birth: NPI#Geoffery Spruce Sex: DEA #: [id number] [phone] Phone #: License #: UPN: Patient Address: [address] Eligha Bridegroom [facility] [place], [place] [address] [address] [place], [place] [address] [phone] Allergies Belen, [name] ([id number]) [id number] Page 7 of 13 cephalexin; mometasone furoate; doxycycline monohydrate Provider's Orders [facility] - ICD10: 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 M) Primary Care Provider: [name] Other Clinician: Referring Provider: Treating Provider/Extender: [name] in Treatment: 86 Active Problems ICD-10 Encounter Code Description Active Date MDM Diagnosis I87.333 Chronic venous hypertension (idiopathic) with ulcer and inflammation of 06/11/2022 No Yes bilateral lower extremity I89.0 Lymphedema, not elsewhere classified 03/26/2022 No Yes L97.828 Non-pressure chronic ulcer of other part of left lower leg with other specified 03/26/2022 No Yes severity 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 other part of right lower leg with other specified 03/26/2022 No Yes severity L84 Corns and callosities 10/15/2022 No Yes Inactive Problems Resolved Problems Electronic Signature(s) Signed: 02/04/2023 1:35:45 PM By: [name] PA-C Entered By: [name] on 02/04/2023 13:35:45 Cameron, [name] ([id number]) [id number] Page 8 of 13 -------------------------------------------------------------------------------- Progress Note Details Patient Name: Date of Service: Stucky, [name] 02/04/2023 2:00 PM Medical Record Number: [id number] Patient Account Number: [id number] Date of Birth/Sex: Treating RN: Aug 10, 1945 (77 y.o. M) Primary Care Provider: [name] Other Clinician: Referring Provider: Treating Provider/Extender: [name] Weeks in Treatment: 70 Subjective Chief Complaint Information obtained from Patient 02/23/2020; patient is here for wounds on his bilateral lower legs in the setting of severe lymphedema 03/26/2022; patient is here for wounds on his bilateral lower legs medial aspect History of Present Illness (HPI) ADMISSION 02/23/2020 Patient is a 77 year old man who lives in [place] who arrives accompanied by his wife. He has a history of chronic  lymphedema and venous insufficiency in his bilateral lower legs which may have something to do that with having a history of DVT as well as being treated for prostate cancer. In any case he recently got compression pumps at home but compliance has been an issue here. He has compression stockings however they are probably not sufficient enough to control swelling. They tell us that things deteriorated for him in late August he was admitted to [facility] for 7 days. This was with cellulitis I think of his bilateral lower legs. Discharge he was noted to have wounds on his bilateral lower legs. He was discharged on Bactrim. They tried to get him home health through [facility] of course they declined him. His wife is been wrapping these applying some form of silver foam dressing. He has a history of wounds before although nothing that would not heal with basic home topical dressings. He has 2 areas on the  of bilateral lower extremity) Electronic Signature(s) Signed: 02/04/2023 3:13:57 PM By: [name] RN Signed: 02/04/2023 3:48:11 PM By: [name] PA-C Previous Signature: 02/04/2023 2:48:04 PM Version By: [name] RN Entered By: [name] on 02/04/2023 15:02:56 Prescription 02/04/2023 -------------------------------------------------------------------------------- Maclaren, Oley Balm PA Patient Name: Provider: 03-Sep-1945 [id number] Date of Birth: NPI#Geoffery Spruce Sex: DEA #: [id number] [phone] Phone #: License #: UPN: Patient Address: [address] Eligha Bridegroom [facility] [place], [place] [address] [address] [place], [place] [address] [phone] Allergies Belen, [name] ([id number]) [id number] Page 7 of 13 cephalexin; mometasone furoate; doxycycline monohydrate Provider's Orders [facility] - ICD10: 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 M) Primary Care Provider: [name] Other Clinician: Referring Provider: Treating Provider/Extender: [name] in Treatment: 86 Active Problems ICD-10 Encounter Code Description Active Date MDM Diagnosis I87.333 Chronic venous hypertension (idiopathic) with ulcer and inflammation of 06/11/2022 No Yes bilateral lower extremity I89.0 Lymphedema, not elsewhere classified 03/26/2022 No Yes L97.828 Non-pressure chronic ulcer of other part of left lower leg with other specified 03/26/2022 No Yes severity 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 other part of right lower leg with other specified 03/26/2022 No Yes severity L84 Corns and callosities 10/15/2022 No Yes Inactive Problems Resolved Problems Electronic Signature(s) Signed: 02/04/2023 1:35:45 PM By: [name] PA-C Entered By: [name] on 02/04/2023 13:35:45 Cameron, [name] ([id number]) [id number] Page 8 of 13 -------------------------------------------------------------------------------- Progress Note Details Patient Name: Date of Service: Stucky, [name] 02/04/2023 2:00 PM Medical Record Number: [id number] Patient Account Number: [id number] Date of Birth/Sex: Treating RN: Aug 10, 1945 (77 y.o. M) Primary Care Provider: [name] Other Clinician: Referring Provider: Treating Provider/Extender: [name] Weeks in Treatment: 70 Subjective Chief Complaint Information obtained from Patient 02/23/2020; patient is here for wounds on his bilateral lower legs in the setting of severe lymphedema 03/26/2022; patient is here for wounds on his bilateral lower legs medial aspect History of Present Illness (HPI) ADMISSION 02/23/2020 Patient is a 77 year old man who lives in [place] who arrives accompanied by his wife. He has a history of chronic  lymphedema and venous insufficiency in his bilateral lower legs which may have something to do that with having a history of DVT as well as being treated for prostate cancer. In any case he recently got compression pumps at home but compliance has been an issue here. He has compression stockings however they are probably not sufficient enough to control swelling. They tell us that things deteriorated for him in late August he was admitted to [facility] for 7 days. This was with cellulitis I think of his bilateral lower legs. Discharge he was noted to have wounds on his bilateral lower legs. He was discharged on Bactrim. They tried to get him home health through [facility] of course they declined him. His wife is been wrapping these applying some form of silver foam dressing. He has a history of wounds before although nothing that would not heal with basic home topical dressings. He has 2 areas on the  and overall I do not know exactly what he did different other than he tells me that he "has been taking his fluid pills every day instead of every other day. Obviously this is needed. I was under the understanding previously that he was taking this every day but I think he may also not been pumping every day like he was telling me but I cannot be sure this either way he does seem to be doing what he supposed to do at this point. Electronic Signature(s) Signed: 02/04/2023 2:26:26 PM By: [name] PA-C Entered By: [name] on 02/04/2023 14:26:26 -------------------------------------------------------------------------------- Physical Exam Details Patient Name: Date of Service: Nicholls, [name] 02/04/2023 2:00 PM Medical Record Number: [id number] Patient Account Number: [id number] Date of Birth/Sex: Treating RN: February 16, 1946 (77 y.o. M) Primary Care Provider: [name] Other Clinician: Referring Provider: Treating Provider/Extender: [name] Weeks in Treatment: 45 Constitutional Obese and well-hydrated in no acute distress. Respiratory normal breathing without difficulty. Psychiatric this patient is able to make decisions and demonstrates good insight into disease process. Alert and Oriented x 3. pleasant and cooperative. Notes Upon inspection patient's wound bed actually showed signs of good granulation and epithelization at this point. Fortunately there does not appear to be any signs of infection locally or systemically which is great news and in general I do believe that we are making headway towards complete closure which is good news.  This is actually one of the first weeks that have seen some really improvement. Again he does sound like he is taken at the heart my conversation with him last week taking the medicine on a regular basis which is definitely something that appears to have benefited him as far as the fluid control is concerned. Electronic Signature(s) Signed: 02/04/2023 2:26:49 PM By: [name] PA-C Entered By: [name] on 02/04/2023 14:26:48 Meiners, [name] ([id number]) [id number] Page 5 of 13 -------------------------------------------------------------------------------- Physician Orders Details Patient Name: Date of Service: Muto, [name] 02/04/2023 2:00 PM Medical Record Number: [id number] Patient Account Number: [id number] Date of Birth/Sex: Treating RN: 06-17-1945 (77 y.o. [name]: [name] Other Clinician: Referring Provider: Treating Provider/Extender: [name] in Treatment: 50 Verbal / Phone Orders: No Diagnosis Coding ICD-10 Coding Code Description [phone] Chronic venous hypertension (idiopathic) with ulcer and inflammation of bilateral lower extremity I89.0 Lymphedema, not elsewhere classified 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 other part of right lower leg with other specified severity L84 Corns and callosities Follow-up Appointments ppointment in 1 week. [name] PA Wednesday ****extra time 60 minutes**** Return A 02/11/2023 2pm ppointment in 2 weeks. [name] PA (already scheduled) Return A Return appointment in 3 weeks. - Please see front desk to schedule appointment Return appointment in 1 month. - Please see front desk to schedule appointment Other: - Referral wound care at [facility]- they will call you to schedule the appointment. Anesthetic (In clinic) Topical  Lidocaine 5% applied to wound bed Bathing/ Shower/ Hygiene May shower with protection but do not get wound dressing(s) wet. Protect dressing(s) with water repellant cover (for example, large plastic bag) or a cast cover and may then take shower. Edema Control - Lymphedema / SCD / Other Lymphedema Pumps. Use Lymphedema pumps on leg(s) 2-3 times a day for 45-60 minutes. If wearing any wraps or hose, do not remove them. Continue exercising  of bilateral lower extremity) Electronic Signature(s) Signed: 02/04/2023 3:13:57 PM By: [name] RN Signed: 02/04/2023 3:48:11 PM By: [name] PA-C Previous Signature: 02/04/2023 2:48:04 PM Version By: [name] RN Entered By: [name] on 02/04/2023 15:02:56 Prescription 02/04/2023 -------------------------------------------------------------------------------- Maclaren, Oley Balm PA Patient Name: Provider: 03-Sep-1945 [id number] Date of Birth: NPI#Geoffery Spruce Sex: DEA #: [id number] [phone] Phone #: License #: UPN: Patient Address: [address] Eligha Bridegroom [facility] [place], [place] [address] [address] [place], [place] [address] [phone] Allergies Belen, [name] ([id number]) [id number] Page 7 of 13 cephalexin; mometasone furoate; doxycycline monohydrate Provider's Orders [facility] - ICD10: 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 M) Primary Care Provider: [name] Other Clinician: Referring Provider: Treating Provider/Extender: [name] in Treatment: 86 Active Problems ICD-10 Encounter Code Description Active Date MDM Diagnosis I87.333 Chronic venous hypertension (idiopathic) with ulcer and inflammation of 06/11/2022 No Yes bilateral lower extremity I89.0 Lymphedema, not elsewhere classified 03/26/2022 No Yes L97.828 Non-pressure chronic ulcer of other part of left lower leg with other specified 03/26/2022 No Yes severity 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 other part of right lower leg with other specified 03/26/2022 No Yes severity L84 Corns and callosities 10/15/2022 No Yes Inactive Problems Resolved Problems Electronic Signature(s) Signed: 02/04/2023 1:35:45 PM By: [name] PA-C Entered By: [name] on 02/04/2023 13:35:45 Cameron, [name] ([id number]) [id number] Page 8 of 13 -------------------------------------------------------------------------------- Progress Note Details Patient Name: Date of Service: Stucky, [name] 02/04/2023 2:00 PM Medical Record Number: [id number] Patient Account Number: [id number] Date of Birth/Sex: Treating RN: Aug 10, 1945 (77 y.o. M) Primary Care Provider: [name] Other Clinician: Referring Provider: Treating Provider/Extender: [name] Weeks in Treatment: 70 Subjective Chief Complaint Information obtained from Patient 02/23/2020; patient is here for wounds on his bilateral lower legs in the setting of severe lymphedema 03/26/2022; patient is here for wounds on his bilateral lower legs medial aspect History of Present Illness (HPI) ADMISSION 02/23/2020 Patient is a 77 year old man who lives in [place] who arrives accompanied by his wife. He has a history of chronic  lymphedema and venous insufficiency in his bilateral lower legs which may have something to do that with having a history of DVT as well as being treated for prostate cancer. In any case he recently got compression pumps at home but compliance has been an issue here. He has compression stockings however they are probably not sufficient enough to control swelling. They tell us that things deteriorated for him in late August he was admitted to [facility] for 7 days. This was with cellulitis I think of his bilateral lower legs. Discharge he was noted to have wounds on his bilateral lower legs. He was discharged on Bactrim. They tried to get him home health through [facility] of course they declined him. His wife is been wrapping these applying some form of silver foam dressing. He has a history of wounds before although nothing that would not heal with basic home topical dressings. He has 2 areas on the  of bilateral lower extremity) Electronic Signature(s) Signed: 02/04/2023 3:13:57 PM By: [name] RN Signed: 02/04/2023 3:48:11 PM By: [name] PA-C Previous Signature: 02/04/2023 2:48:04 PM Version By: [name] RN Entered By: [name] on 02/04/2023 15:02:56 Prescription 02/04/2023 -------------------------------------------------------------------------------- Maclaren, Oley Balm PA Patient Name: Provider: 03-Sep-1945 [id number] Date of Birth: NPI#Geoffery Spruce Sex: DEA #: [id number] [phone] Phone #: License #: UPN: Patient Address: [address] Eligha Bridegroom [facility] [place], [place] [address] [address] [place], [place] [address] [phone] Allergies Belen, [name] ([id number]) [id number] Page 7 of 13 cephalexin; mometasone furoate; doxycycline monohydrate Provider's Orders [facility] - ICD10: 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 M) Primary Care Provider: [name] Other Clinician: Referring Provider: Treating Provider/Extender: [name] in Treatment: 86 Active Problems ICD-10 Encounter Code Description Active Date MDM Diagnosis I87.333 Chronic venous hypertension (idiopathic) with ulcer and inflammation of 06/11/2022 No Yes bilateral lower extremity I89.0 Lymphedema, not elsewhere classified 03/26/2022 No Yes L97.828 Non-pressure chronic ulcer of other part of left lower leg with other specified 03/26/2022 No Yes severity 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 other part of right lower leg with other specified 03/26/2022 No Yes severity L84 Corns and callosities 10/15/2022 No Yes Inactive Problems Resolved Problems Electronic Signature(s) Signed: 02/04/2023 1:35:45 PM By: [name] PA-C Entered By: [name] on 02/04/2023 13:35:45 Cameron, [name] ([id number]) [id number] Page 8 of 13 -------------------------------------------------------------------------------- Progress Note Details Patient Name: Date of Service: Stucky, [name] 02/04/2023 2:00 PM Medical Record Number: [id number] Patient Account Number: [id number] Date of Birth/Sex: Treating RN: Aug 10, 1945 (77 y.o. M) Primary Care Provider: [name] Other Clinician: Referring Provider: Treating Provider/Extender: [name] Weeks in Treatment: 70 Subjective Chief Complaint Information obtained from Patient 02/23/2020; patient is here for wounds on his bilateral lower legs in the setting of severe lymphedema 03/26/2022; patient is here for wounds on his bilateral lower legs medial aspect History of Present Illness (HPI) ADMISSION 02/23/2020 Patient is a 77 year old man who lives in [place] who arrives accompanied by his wife. He has a history of chronic  lymphedema and venous insufficiency in his bilateral lower legs which may have something to do that with having a history of DVT as well as being treated for prostate cancer. In any case he recently got compression pumps at home but compliance has been an issue here. He has compression stockings however they are probably not sufficient enough to control swelling. They tell us that things deteriorated for him in late August he was admitted to [facility] for 7 days. This was with cellulitis I think of his bilateral lower legs. Discharge he was noted to have wounds on his bilateral lower legs. He was discharged on Bactrim. They tried to get him home health through [facility] of course they declined him. His wife is been wrapping these applying some form of silver foam dressing. He has a history of wounds before although nothing that would not heal with basic home topical dressings. He has 2 areas on the  Minniefield, [name] [id number] Page 1 of 13 Visit Report for 02/04/2023 Chief Complaint Document Details Patient Name: Date of Service: Kilman, [name] 02/04/2023 2:00 PM Medical Record Number: [id number] Patient Account Number: [id number] Date of Birth/Sex: Treating RN: 1946-04-05 (77 y.o. M) Primary Care Provider: [name] Other Clinician: Referring Provider: Treating Provider/Extender: [name] Weeks in Treatment: 36 Information Obtained from: Patient Chief Complaint 02/23/2020; patient is here for wounds on his bilateral lower legs in the setting of severe lymphedema 03/26/2022; patient is here for wounds on his bilateral lower legs medial aspect Electronic Signature(s) Signed: 02/04/2023 1:36:05 PM By: [name] PA-C Entered By: [name] on 02/04/2023 13:36:05 -------------------------------------------------------------------------------- HPI Details Patient Name: Date of Service: Mccleod, [name] 02/04/2023 2:00 PM Medical Record Number: [id number] Patient Account Number: [id number] Date of Birth/Sex: Treating RN: 08/07/1945 (77 y.o. M) Primary Care Provider: [name] Other Clinician: Referring Provider: Treating Provider/Extender: [name] Weeks in Treatment: 31 History of Present Illness HPI Description: ADMISSION 02/23/2020 Patient is a 77 year old man who lives in [place] who arrives accompanied by his wife. He has a history of chronic lymphedema and venous insufficiency in his bilateral lower legs which may have something to do that with having a history of DVT as well as being treated for prostate cancer. In any case he recently got compression pumps at home but compliance has been an issue here. He has compression stockings however they are probably not sufficient enough to control swelling. They tell us that things deteriorated for him in late August he was admitted  to [facility] for 7 days. This was with cellulitis I think of his bilateral lower legs. Discharge he was noted to have wounds on his bilateral lower legs. He was discharged on Bactrim. They tried to get him home health through [facility] of course they declined him. His wife is been wrapping these applying some form of silver foam dressing. He has a history of wounds before although nothing that would not heal with basic home topical dressings. He has 2 areas on the left medial, left anterior and left lateral and a smaller area on the right medial. All of these have considerable depth. Past medical history includes iron deficiency anemia, lymphedema followed by the rehab center at [facility] with lymphedema wraps I believe, DVT on chronic anticoagulation, prostate cancer, chronic venous insufficiency, hypertension. As mentioned he has compression pumps but does not use them. ABIs in our clinic were noncompressible bilaterally 10/14; patient with severe bilateral lymphedema right greater than left. He came in with bilateral lower extremity wounds left greater than right. Even though the right side has more of the edema most of the wounds here almost closed on the right medial. He has 3 remaining wounds on the left We have been using silver alginate under 4-layer compression I have been trying to get him to be compliant with his external compression pumps 10/21; patient with 3 small wounds on the left leg and 1 on the right medial in the setting of severe lymphedema and chronic venous insufficiency. We have been using silver alginate under 4-layer compression he is using his external compression pumps twice a day 11/4; ARTERIAL STUDIES on the right show an ABI of 1.02 TBI of 0.858 with biphasic waveforms on the left 0.98 with a TBI of 0.55 and biphasic waveforms. Does not look like he has significant arterial disease. We are treating him for  and overall I do not know exactly what he did different other than he tells me that he "has been taking his fluid pills every day instead of every other day. Obviously this is needed. I was under the understanding previously that he was taking this every day but I think he may also not been pumping every day like he was telling me but I cannot be sure this either way he does seem to be doing what he supposed to do at this point. Electronic Signature(s) Signed: 02/04/2023 2:26:26 PM By: [name] PA-C Entered By: [name] on 02/04/2023 14:26:26 -------------------------------------------------------------------------------- Physical Exam Details Patient Name: Date of Service: Nicholls, [name] 02/04/2023 2:00 PM Medical Record Number: [id number] Patient Account Number: [id number] Date of Birth/Sex: Treating RN: February 16, 1946 (77 y.o. M) Primary Care Provider: [name] Other Clinician: Referring Provider: Treating Provider/Extender: [name] Weeks in Treatment: 45 Constitutional Obese and well-hydrated in no acute distress. Respiratory normal breathing without difficulty. Psychiatric this patient is able to make decisions and demonstrates good insight into disease process. Alert and Oriented x 3. pleasant and cooperative. Notes Upon inspection patient's wound bed actually showed signs of good granulation and epithelization at this point. Fortunately there does not appear to be any signs of infection locally or systemically which is great news and in general I do believe that we are making headway towards complete closure which is good news.  This is actually one of the first weeks that have seen some really improvement. Again he does sound like he is taken at the heart my conversation with him last week taking the medicine on a regular basis which is definitely something that appears to have benefited him as far as the fluid control is concerned. Electronic Signature(s) Signed: 02/04/2023 2:26:49 PM By: [name] PA-C Entered By: [name] on 02/04/2023 14:26:48 Meiners, [name] ([id number]) [id number] Page 5 of 13 -------------------------------------------------------------------------------- Physician Orders Details Patient Name: Date of Service: Muto, [name] 02/04/2023 2:00 PM Medical Record Number: [id number] Patient Account Number: [id number] Date of Birth/Sex: Treating RN: 06-17-1945 (77 y.o. [name]: [name] Other Clinician: Referring Provider: Treating Provider/Extender: [name] in Treatment: 50 Verbal / Phone Orders: No Diagnosis Coding ICD-10 Coding Code Description [phone] Chronic venous hypertension (idiopathic) with ulcer and inflammation of bilateral lower extremity I89.0 Lymphedema, not elsewhere classified 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 other part of right lower leg with other specified severity L84 Corns and callosities Follow-up Appointments ppointment in 1 week. [name] PA Wednesday ****extra time 60 minutes**** Return A 02/11/2023 2pm ppointment in 2 weeks. [name] PA (already scheduled) Return A Return appointment in 3 weeks. - Please see front desk to schedule appointment Return appointment in 1 month. - Please see front desk to schedule appointment Other: - Referral wound care at [facility]- they will call you to schedule the appointment. Anesthetic (In clinic) Topical  Lidocaine 5% applied to wound bed Bathing/ Shower/ Hygiene May shower with protection but do not get wound dressing(s) wet. Protect dressing(s) with water repellant cover (for example, large plastic bag) or a cast cover and may then take shower. Edema Control - Lymphedema / SCD / Other Lymphedema Pumps. Use Lymphedema pumps on leg(s) 2-3 times a day for 45-60 minutes. If wearing any wraps or hose, do not remove them. Continue exercising  Minniefield, [name] [id number] Page 1 of 13 Visit Report for 02/04/2023 Chief Complaint Document Details Patient Name: Date of Service: Kilman, [name] 02/04/2023 2:00 PM Medical Record Number: [id number] Patient Account Number: [id number] Date of Birth/Sex: Treating RN: 1946-04-05 (77 y.o. M) Primary Care Provider: [name] Other Clinician: Referring Provider: Treating Provider/Extender: [name] Weeks in Treatment: 36 Information Obtained from: Patient Chief Complaint 02/23/2020; patient is here for wounds on his bilateral lower legs in the setting of severe lymphedema 03/26/2022; patient is here for wounds on his bilateral lower legs medial aspect Electronic Signature(s) Signed: 02/04/2023 1:36:05 PM By: [name] PA-C Entered By: [name] on 02/04/2023 13:36:05 -------------------------------------------------------------------------------- HPI Details Patient Name: Date of Service: Mccleod, [name] 02/04/2023 2:00 PM Medical Record Number: [id number] Patient Account Number: [id number] Date of Birth/Sex: Treating RN: 08/07/1945 (77 y.o. M) Primary Care Provider: [name] Other Clinician: Referring Provider: Treating Provider/Extender: [name] Weeks in Treatment: 31 History of Present Illness HPI Description: ADMISSION 02/23/2020 Patient is a 77 year old man who lives in [place] who arrives accompanied by his wife. He has a history of chronic lymphedema and venous insufficiency in his bilateral lower legs which may have something to do that with having a history of DVT as well as being treated for prostate cancer. In any case he recently got compression pumps at home but compliance has been an issue here. He has compression stockings however they are probably not sufficient enough to control swelling. They tell us that things deteriorated for him in late August he was admitted  to [facility] for 7 days. This was with cellulitis I think of his bilateral lower legs. Discharge he was noted to have wounds on his bilateral lower legs. He was discharged on Bactrim. They tried to get him home health through [facility] of course they declined him. His wife is been wrapping these applying some form of silver foam dressing. He has a history of wounds before although nothing that would not heal with basic home topical dressings. He has 2 areas on the left medial, left anterior and left lateral and a smaller area on the right medial. All of these have considerable depth. Past medical history includes iron deficiency anemia, lymphedema followed by the rehab center at [facility] with lymphedema wraps I believe, DVT on chronic anticoagulation, prostate cancer, chronic venous insufficiency, hypertension. As mentioned he has compression pumps but does not use them. ABIs in our clinic were noncompressible bilaterally 10/14; patient with severe bilateral lymphedema right greater than left. He came in with bilateral lower extremity wounds left greater than right. Even though the right side has more of the edema most of the wounds here almost closed on the right medial. He has 3 remaining wounds on the left We have been using silver alginate under 4-layer compression I have been trying to get him to be compliant with his external compression pumps 10/21; patient with 3 small wounds on the left leg and 1 on the right medial in the setting of severe lymphedema and chronic venous insufficiency. We have been using silver alginate under 4-layer compression he is using his external compression pumps twice a day 11/4; ARTERIAL STUDIES on the right show an ABI of 1.02 TBI of 0.858 with biphasic waveforms on the left 0.98 with a TBI of 0.55 and biphasic waveforms. Does not look like he has significant arterial disease. We are treating him for  directed Com pression Wrap: Urgo K2, (equivalent to a 4 layer) two layer compression system, regular 1 x Per Week/30 Days Discharge Instructions: Apply Urgo K2 as directed (alternative to 4 layer compression). WOUND #7: - Lower Leg Wound Laterality: Left, Medial Cleanser: Soap and Water 1 x Per Week/30 Days Discharge Instructions: May shower and wash wound with dial antibacterial soap and water prior to dressing change. Peri-Wound Care: Sween Lotion (Moisturizing lotion) 1 x Per Week/30 Days Discharge Instructions: Apply moisturizing lotion as directed Prim Dressing: Aquacel Ag 1 x Per Week/30 Days ary Discharge Instructions: apply directly to wound bed. Secondary Dressing: ABD Pad, 8x10 1 x Per Week/30 Days Discharge Instructions: Apply over  primary dressing as directed. Secondary Dressing: OptiLock Super Absorbent, 5x5.5 (in/in) (Generic) 1 x Per Week/30 Days Discharge Instructions: Apply directly to wound bed as directed Com pression Wrap: Urgo K2, (equivalent to a 4 layer) two layer compression system, regular 1 x Per Week/30 Days Discharge Instructions: Apply Urgo K2 as directed (alternative to 4 layer compression). Klett, [name] ([id number]) [id number] Page 13 of 13 1. I would recommend that we have the patient continue to monitor for any signs of infection or worsening. Based on what I am seeing I do believe that we will make an excellent headway towards complete closure which is great news. 2. I would recommend as well that the patient should continue to monitor for any signs of infection or worsening. Based on what I am seeing I think were making really good progress towards closure and hopefully he will continue to show signs of improvement going forward week by week we will see where things stand next week. We will see patient back for reevaluation in 1 week here in the clinic. If anything worsens or changes patient will contact our office for additional recommendations. Electronic Signature(s) Signed: 02/04/2023 8:55:50 PM By: [name] RN, [name] Signed: 02/06/2023 12:49:56 PM By: [name] PA-C Previous Signature: 02/04/2023 2:27:24 PM Version By: [name] PA-C Entered By: [name] on 02/04/2023 20:53:50 -------------------------------------------------------------------------------- SuperBill Details Patient Name: Date of Service: Stroud, [name] 02/04/2023 Medical Record Number: [id number] Patient Account Number: [id number] Date of Birth/Sex: Treating RN: Mar 02, 1946 (77 y.o. 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 ICD-10 Diagnosis Description I87.333 Chronic venous hypertension (idiopathic) with ulcer and inflammation of bilateral lower extre  I89.0 Lymphedema, not elsewhere classified 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 other part of right lower leg with other specified severity Modifier: mity Quantity: 1 Physician Procedures : CPT4 Code Description Modifier [id number] - WC PHYS LEVEL 3 - EST PT ICD-10 Diagnosis Description I87.333 Chronic venous hypertension (idiopathic) with ulcer and inflammation of bilateral lower extremity I89.0 Lymphedema, not elsewhere classified  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 other part of right  and overall I do not know exactly what he did different other than he tells me that he "has been taking his fluid pills every day instead of every other day. Obviously this is needed. I was under the understanding previously that he was taking this every day but I think he may also not been pumping every day like he was telling me but I cannot be sure this either way he does seem to be doing what he supposed to do at this point. Electronic Signature(s) Signed: 02/04/2023 2:26:26 PM By: [name] PA-C Entered By: [name] on 02/04/2023 14:26:26 -------------------------------------------------------------------------------- Physical Exam Details Patient Name: Date of Service: Nicholls, [name] 02/04/2023 2:00 PM Medical Record Number: [id number] Patient Account Number: [id number] Date of Birth/Sex: Treating RN: February 16, 1946 (77 y.o. M) Primary Care Provider: [name] Other Clinician: Referring Provider: Treating Provider/Extender: [name] Weeks in Treatment: 45 Constitutional Obese and well-hydrated in no acute distress. Respiratory normal breathing without difficulty. Psychiatric this patient is able to make decisions and demonstrates good insight into disease process. Alert and Oriented x 3. pleasant and cooperative. Notes Upon inspection patient's wound bed actually showed signs of good granulation and epithelization at this point. Fortunately there does not appear to be any signs of infection locally or systemically which is great news and in general I do believe that we are making headway towards complete closure which is good news.  This is actually one of the first weeks that have seen some really improvement. Again he does sound like he is taken at the heart my conversation with him last week taking the medicine on a regular basis which is definitely something that appears to have benefited him as far as the fluid control is concerned. Electronic Signature(s) Signed: 02/04/2023 2:26:49 PM By: [name] PA-C Entered By: [name] on 02/04/2023 14:26:48 Meiners, [name] ([id number]) [id number] Page 5 of 13 -------------------------------------------------------------------------------- Physician Orders Details Patient Name: Date of Service: Muto, [name] 02/04/2023 2:00 PM Medical Record Number: [id number] Patient Account Number: [id number] Date of Birth/Sex: Treating RN: 06-17-1945 (77 y.o. [name]: [name] Other Clinician: Referring Provider: Treating Provider/Extender: [name] in Treatment: 50 Verbal / Phone Orders: No Diagnosis Coding ICD-10 Coding Code Description [phone] Chronic venous hypertension (idiopathic) with ulcer and inflammation of bilateral lower extremity I89.0 Lymphedema, not elsewhere classified 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 other part of right lower leg with other specified severity L84 Corns and callosities Follow-up Appointments ppointment in 1 week. [name] PA Wednesday ****extra time 60 minutes**** Return A 02/11/2023 2pm ppointment in 2 weeks. [name] PA (already scheduled) Return A Return appointment in 3 weeks. - Please see front desk to schedule appointment Return appointment in 1 month. - Please see front desk to schedule appointment Other: - Referral wound care at [facility]- they will call you to schedule the appointment. Anesthetic (In clinic) Topical  Lidocaine 5% applied to wound bed Bathing/ Shower/ Hygiene May shower with protection but do not get wound dressing(s) wet. Protect dressing(s) with water repellant cover (for example, large plastic bag) or a cast cover and may then take shower. Edema Control - Lymphedema / SCD / Other Lymphedema Pumps. Use Lymphedema pumps on leg(s) 2-3 times a day for 45-60 minutes. If wearing any wraps or hose, do not remove them. Continue exercising  and overall I do not know exactly what he did different other than he tells me that he "has been taking his fluid pills every day instead of every other day. Obviously this is needed. I was under the understanding previously that he was taking this every day but I think he may also not been pumping every day like he was telling me but I cannot be sure this either way he does seem to be doing what he supposed to do at this point. Electronic Signature(s) Signed: 02/04/2023 2:26:26 PM By: [name] PA-C Entered By: [name] on 02/04/2023 14:26:26 -------------------------------------------------------------------------------- Physical Exam Details Patient Name: Date of Service: Nicholls, [name] 02/04/2023 2:00 PM Medical Record Number: [id number] Patient Account Number: [id number] Date of Birth/Sex: Treating RN: February 16, 1946 (77 y.o. M) Primary Care Provider: [name] Other Clinician: Referring Provider: Treating Provider/Extender: [name] Weeks in Treatment: 45 Constitutional Obese and well-hydrated in no acute distress. Respiratory normal breathing without difficulty. Psychiatric this patient is able to make decisions and demonstrates good insight into disease process. Alert and Oriented x 3. pleasant and cooperative. Notes Upon inspection patient's wound bed actually showed signs of good granulation and epithelization at this point. Fortunately there does not appear to be any signs of infection locally or systemically which is great news and in general I do believe that we are making headway towards complete closure which is good news.  This is actually one of the first weeks that have seen some really improvement. Again he does sound like he is taken at the heart my conversation with him last week taking the medicine on a regular basis which is definitely something that appears to have benefited him as far as the fluid control is concerned. Electronic Signature(s) Signed: 02/04/2023 2:26:49 PM By: [name] PA-C Entered By: [name] on 02/04/2023 14:26:48 Meiners, [name] ([id number]) [id number] Page 5 of 13 -------------------------------------------------------------------------------- Physician Orders Details Patient Name: Date of Service: Muto, [name] 02/04/2023 2:00 PM Medical Record Number: [id number] Patient Account Number: [id number] Date of Birth/Sex: Treating RN: 06-17-1945 (77 y.o. [name]: [name] Other Clinician: Referring Provider: Treating Provider/Extender: [name] in Treatment: 50 Verbal / Phone Orders: No Diagnosis Coding ICD-10 Coding Code Description [phone] Chronic venous hypertension (idiopathic) with ulcer and inflammation of bilateral lower extremity I89.0 Lymphedema, not elsewhere classified 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 other part of right lower leg with other specified severity L84 Corns and callosities Follow-up Appointments ppointment in 1 week. [name] PA Wednesday ****extra time 60 minutes**** Return A 02/11/2023 2pm ppointment in 2 weeks. [name] PA (already scheduled) Return A Return appointment in 3 weeks. - Please see front desk to schedule appointment Return appointment in 1 month. - Please see front desk to schedule appointment Other: - Referral wound care at [facility]- they will call you to schedule the appointment. Anesthetic (In clinic) Topical  Lidocaine 5% applied to wound bed Bathing/ Shower/ Hygiene May shower with protection but do not get wound dressing(s) wet. Protect dressing(s) with water repellant cover (for example, large plastic bag) or a cast cover and may then take shower. Edema Control - Lymphedema / SCD / Other Lymphedema Pumps. Use Lymphedema pumps on leg(s) 2-3 times a day for 45-60 minutes. If wearing any wraps or hose, do not remove them. Continue exercising  of bilateral lower extremity) Electronic Signature(s) Signed: 02/04/2023 3:13:57 PM By: [name] RN Signed: 02/04/2023 3:48:11 PM By: [name] PA-C Previous Signature: 02/04/2023 2:48:04 PM Version By: [name] RN Entered By: [name] on 02/04/2023 15:02:56 Prescription 02/04/2023 -------------------------------------------------------------------------------- Maclaren, Oley Balm PA Patient Name: Provider: 03-Sep-1945 [id number] Date of Birth: NPI#Geoffery Spruce Sex: DEA #: [phone] Phone #: License #: UPN: Patient Address: [address] Eligha Bridegroom [facility] [place], [place] [address] [address] [place], [place] [address] [phone] Allergies Belen, [name] ([id number]) [id number] Page 7 of 13 cephalexin; mometasone furoate; doxycycline monohydrate Provider's Orders [facility] - ICD10: 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 M) Primary Care Provider: [name] Other Clinician: Referring Provider: Treating Provider/Extender: [name] in Treatment: 86 Active Problems ICD-10 Encounter Code Description Active Date MDM Diagnosis I87.333 Chronic venous hypertension (idiopathic) with ulcer and inflammation of 06/11/2022 No Yes bilateral lower extremity I89.0 Lymphedema, not elsewhere classified 03/26/2022 No Yes L97.828 Non-pressure chronic ulcer of other part of left lower leg with other specified 03/26/2022 No Yes severity 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 other part of right lower leg with other specified 03/26/2022 No Yes severity L84 Corns and callosities 10/15/2022 No Yes Inactive Problems Resolved Problems Electronic Signature(s) Signed: 02/04/2023 1:35:45 PM By: [name] PA-C Entered By: [name] on 02/04/2023 13:35:45 Cameron, [name] ([id number]) [id number] Page 8 of 13 -------------------------------------------------------------------------------- Progress Note Details Patient Name: Date of Service: Stucky, [name] 02/04/2023 2:00 PM Medical Record Number: [id number] Patient Account Number: [id number] Date of Birth/Sex: Treating RN: Aug 10, 1945 (77 y.o. M) Primary Care Provider: [name] Other Clinician: Referring Provider: Treating Provider/Extender: [name] Weeks in Treatment: 70 Subjective Chief Complaint Information obtained from Patient 02/23/2020; patient is here for wounds on his bilateral lower legs in the setting of severe lymphedema 03/26/2022; patient is here for wounds on his bilateral lower legs medial aspect History of Present Illness (HPI) ADMISSION 02/23/2020 Patient is a 77 year old man who lives in [place] who arrives accompanied by his wife. He has a history of chronic  lymphedema and venous insufficiency in his bilateral lower legs which may have something to do that with having a history of DVT as well as being treated for prostate cancer. In any case he recently got compression pumps at home but compliance has been an issue here. He has compression stockings however they are probably not sufficient enough to control swelling. They tell us that things deteriorated for him in late August he was admitted to [facility] for 7 days. This was with cellulitis I think of his bilateral lower legs. Discharge he was noted to have wounds on his bilateral lower legs. He was discharged on Bactrim. They tried to get him home health through [facility] of course they declined him. His wife is been wrapping these applying some form of silver foam dressing. He has a history of wounds before although nothing that would not heal with basic home topical dressings. He has 2 areas on the  and overall I do not know exactly what he did different other than he tells me that he "has been taking his fluid pills every day instead of every other day. Obviously this is needed. I was under the understanding previously that he was taking this every day but I think he may also not been pumping every day like he was telling me but I cannot be sure this either way he does seem to be doing what he supposed to do at this point. Electronic Signature(s) Signed: 02/04/2023 2:26:26 PM By: [name] PA-C Entered By: [name] on 02/04/2023 14:26:26 -------------------------------------------------------------------------------- Physical Exam Details Patient Name: Date of Service: Nicholls, [name] 02/04/2023 2:00 PM Medical Record Number: [id number] Patient Account Number: [id number] Date of Birth/Sex: Treating RN: February 16, 1946 (77 y.o. M) Primary Care Provider: [name] Other Clinician: Referring Provider: Treating Provider/Extender: [name] Weeks in Treatment: 45 Constitutional Obese and well-hydrated in no acute distress. Respiratory normal breathing without difficulty. Psychiatric this patient is able to make decisions and demonstrates good insight into disease process. Alert and Oriented x 3. pleasant and cooperative. Notes Upon inspection patient's wound bed actually showed signs of good granulation and epithelization at this point. Fortunately there does not appear to be any signs of infection locally or systemically which is great news and in general I do believe that we are making headway towards complete closure which is good news.  This is actually one of the first weeks that have seen some really improvement. Again he does sound like he is taken at the heart my conversation with him last week taking the medicine on a regular basis which is definitely something that appears to have benefited him as far as the fluid control is concerned. Electronic Signature(s) Signed: 02/04/2023 2:26:49 PM By: [name] PA-C Entered By: [name] on 02/04/2023 14:26:48 Meiners, [name] ([id number]) [id number] Page 5 of 13 -------------------------------------------------------------------------------- Physician Orders Details Patient Name: Date of Service: Muto, [name] 02/04/2023 2:00 PM Medical Record Number: [id number] Patient Account Number: [id number] Date of Birth/Sex: Treating RN: 06-17-1945 (77 y.o. [name]: [name] Other Clinician: Referring Provider: Treating Provider/Extender: [name] in Treatment: 50 Verbal / Phone Orders: No Diagnosis Coding ICD-10 Coding Code Description [phone] Chronic venous hypertension (idiopathic) with ulcer and inflammation of bilateral lower extremity I89.0 Lymphedema, not elsewhere classified 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 other part of right lower leg with other specified severity L84 Corns and callosities Follow-up Appointments ppointment in 1 week. [name] PA Wednesday ****extra time 60 minutes**** Return A 02/11/2023 2pm ppointment in 2 weeks. [name] PA (already scheduled) Return A Return appointment in 3 weeks. - Please see front desk to schedule appointment Return appointment in 1 month. - Please see front desk to schedule appointment Other: - Referral wound care at [facility]- they will call you to schedule the appointment. Anesthetic (In clinic) Topical  Lidocaine 5% applied to wound bed Bathing/ Shower/ Hygiene May shower with protection but do not get wound dressing(s) wet. Protect dressing(s) with water repellant cover (for example, large plastic bag) or a cast cover and may then take shower. Edema Control - Lymphedema / SCD / Other Lymphedema Pumps. Use Lymphedema pumps on leg(s) 2-3 times a day for 45-60 minutes. If wearing any wraps or hose, do not remove them. Continue exercising

## 2023-02-06 NOTE — Progress Notes (Signed)
To: Patient Education Topics Provided Wound/Skin Impairment: Methods: Explain/Verbal Responses: State  content correctly Electronic Signature(s) Signed: 02/04/2023 2:48:04 PM By: [name] RN Entered By: [name] on 02/04/2023 11:40:31 -------------------------------------------------------------------------------- Wound Assessment Details Patient Name: Date of Service: [name], [name] 02/04/2023 2:00 PM Medical Record Number: [id number] Patient Account Number: [id number] Date of Birth/Sex: Treating RN: [birth year] (77 y.o. M) Primary Care Maximillian Habibi: [name] Other Clinician: Referring [name] Varas: Treating Braxson Hollingsworth/Extender: Leveda Anna, [name] Weeks in Treatment: 45 Wound Status Wound Number: 12 Primary Lymphedema Etiology: Wound Location: Left, Anterior Lower Leg Wound Open Wounding Event: Frostbite Status: Date Acquired: 12/17/2022 Comorbid Anemia, Lymphedema, Deep Vein Thrombosis, Hypertension, Weeks Of Treatment: 7 History: Received Radiation Clustered Wound: No Wound Measurements Length: (cm) 1.3 Width: (cm) 0.4 Depth: (cm) 0.1 Area: (cm) 0.408 Volume: (cm) 0.041 % Reduction in Area: 88.5% % Reduction in Volume: 94.2% Epithelialization: None Tunneling: No Undermining: No Wound Description Classification: Full Thickness Without Exposed Support Structures Wound Margin: Distinct, outline attached Marro, Alfonso ([id number]) Exudate Amount: Large Exudate Type: Serous Exudate Color: amber Foul Odor After Cleansing: No Slough/Fibrino Yes [phone].pdf Page 6 of 11 Wound Bed Granulation Amount: None Present (0%) Exposed Structure Necrotic Amount: Large (67-100%) Fascia Exposed: No Necrotic Quality: Adherent Slough Fat Layer (Subcutaneous Tissue) Exposed: Yes Tendon Exposed: No Muscle Exposed: No Joint Exposed: No Bone Exposed: No Periwound Skin Texture Texture Color No Abnormalities Noted: No No Abnormalities Noted: No Callus: No Atrophie Blanche: No Crepitus: No Cyanosis: No Excoriation: No Ecchymosis:  No Induration: No Erythema: No Rash: No Hemosiderin Staining: Yes Scarring: No Mottled: No Pallor: No Moisture Rubor: No No Abnormalities Noted: No Dry / Scaly: No Temperature / Pain Maceration: No Temperature: No Abnormality Treatment Notes Wound #12 (Lower Leg) Wound Laterality: Left, Anterior Cleanser Soap and Water Discharge Instruction: May shower and wash wound with dial antibacterial soap and water prior to dressing change. Peri-Wound Care Sween Lotion (Moisturizing lotion) Discharge Instruction: Apply moisturizing lotion as directed Topical Primary Dressing Aquacel Ag Discharge Instruction: apply directly to wound bed. Secondary Dressing ABD Pad, 8x10 Discharge Instruction: Apply over primary dressing as directed. OptiLock Super Absorbent, 5x5.5 (in/in) Discharge Instruction: Apply directly to wound bed as directed Secured With Compression Wrap Urgo K2, (equivalent to a 4 layer) two layer compression system, regular Discharge Instruction: Apply Urgo K2 as directed (alternative to 4 layer compression). Compression Stockings Add-Ons Electronic Signature(s) Signed: 02/04/2023 2:48:04 PM By: [name] RN Entered By: [name] on 02/04/2023 11:16:09 Wound Assessment Details -------------------------------------------------------------------------------- [place], [name] ([id number]) [id number] Page 7 of 11 Patient Name: Date of Service: [name], [name] 02/04/2023 2:00 PM Medical Record Number: [id number] Patient Account Number: [id number] Date of Birth/Sex: Treating RN: November 03, 1945 (77 y.o. M) Primary Care Daphnee Preiss: [name] Other Clinician: Referring Charie Pinkus: Treating Daizha Anand/Extender: Leveda Anna, [name] Weeks in Treatment: 45 Wound Status Wound Number: 13 Primary Lymphedema Etiology: Wound Location: Left, Lateral Lower Leg Wound Healed - Epithelialized Wounding Event: Gradually  Appeared Status: Date Acquired: 12/24/2022 Comorbid Anemia, Lymphedema, Deep Vein Thrombosis, Hypertension, Weeks Of Treatment: 6 History: Received Radiation Clustered Wound: No Photos Wound Measurements Length: (cm) 0 % Reduction in Area: 100% Width: (cm) 0 % Reduction in Volume: 100% Depth: (cm) 0 Epithelialization: Large (67-100%) Area: (cm) 0 Tunneling: No Volume: (cm) 0 Undermining: No Wound Description Classification: Full Thickness Without Exposed Support Structures Foul Odor After Cleansing: No Wound Margin: Flat and Intact Slough/Fibrino No Exudate Amount: None Present Wound Bed Granulation Amount: None Present (0%) Exposed  To: Patient Education Topics Provided Wound/Skin Impairment: Methods: Explain/Verbal Responses: State  content correctly Electronic Signature(s) Signed: 02/04/2023 2:48:04 PM By: [name] RN Entered By: [name] on 02/04/2023 11:40:31 -------------------------------------------------------------------------------- Wound Assessment Details Patient Name: Date of Service: [name], [name] 02/04/2023 2:00 PM Medical Record Number: [id number] Patient Account Number: [id number] Date of Birth/Sex: Treating RN: [birth year] (77 y.o. M) Primary Care Maximillian Habibi: [name] Other Clinician: Referring [name] Varas: Treating Braxson Hollingsworth/Extender: Leveda Anna, [name] Weeks in Treatment: 45 Wound Status Wound Number: 12 Primary Lymphedema Etiology: Wound Location: Left, Anterior Lower Leg Wound Open Wounding Event: Frostbite Status: Date Acquired: 12/17/2022 Comorbid Anemia, Lymphedema, Deep Vein Thrombosis, Hypertension, Weeks Of Treatment: 7 History: Received Radiation Clustered Wound: No Wound Measurements Length: (cm) 1.3 Width: (cm) 0.4 Depth: (cm) 0.1 Area: (cm) 0.408 Volume: (cm) 0.041 % Reduction in Area: 88.5% % Reduction in Volume: 94.2% Epithelialization: None Tunneling: No Undermining: No Wound Description Classification: Full Thickness Without Exposed Support Structures Wound Margin: Distinct, outline attached Marro, Alfonso ([id number]) Exudate Amount: Large Exudate Type: Serous Exudate Color: amber Foul Odor After Cleansing: No Slough/Fibrino Yes [phone].pdf Page 6 of 11 Wound Bed Granulation Amount: None Present (0%) Exposed Structure Necrotic Amount: Large (67-100%) Fascia Exposed: No Necrotic Quality: Adherent Slough Fat Layer (Subcutaneous Tissue) Exposed: Yes Tendon Exposed: No Muscle Exposed: No Joint Exposed: No Bone Exposed: No Periwound Skin Texture Texture Color No Abnormalities Noted: No No Abnormalities Noted: No Callus: No Atrophie Blanche: No Crepitus: No Cyanosis: No Excoriation: No Ecchymosis:  No Induration: No Erythema: No Rash: No Hemosiderin Staining: Yes Scarring: No Mottled: No Pallor: No Moisture Rubor: No No Abnormalities Noted: No Dry / Scaly: No Temperature / Pain Maceration: No Temperature: No Abnormality Treatment Notes Wound #12 (Lower Leg) Wound Laterality: Left, Anterior Cleanser Soap and Water Discharge Instruction: May shower and wash wound with dial antibacterial soap and water prior to dressing change. Peri-Wound Care Sween Lotion (Moisturizing lotion) Discharge Instruction: Apply moisturizing lotion as directed Topical Primary Dressing Aquacel Ag Discharge Instruction: apply directly to wound bed. Secondary Dressing ABD Pad, 8x10 Discharge Instruction: Apply over primary dressing as directed. OptiLock Super Absorbent, 5x5.5 (in/in) Discharge Instruction: Apply directly to wound bed as directed Secured With Compression Wrap Urgo K2, (equivalent to a 4 layer) two layer compression system, regular Discharge Instruction: Apply Urgo K2 as directed (alternative to 4 layer compression). Compression Stockings Add-Ons Electronic Signature(s) Signed: 02/04/2023 2:48:04 PM By: [name] RN Entered By: [name] on 02/04/2023 11:16:09 Wound Assessment Details -------------------------------------------------------------------------------- [place], [name] ([id number]) [id number] Page 7 of 11 Patient Name: Date of Service: [name], [name] 02/04/2023 2:00 PM Medical Record Number: [id number] Patient Account Number: [id number] Date of Birth/Sex: Treating RN: November 03, 1945 (77 y.o. M) Primary Care Daphnee Preiss: [name] Other Clinician: Referring Charie Pinkus: Treating Daizha Anand/Extender: Leveda Anna, [name] Weeks in Treatment: 45 Wound Status Wound Number: 13 Primary Lymphedema Etiology: Wound Location: Left, Lateral Lower Leg Wound Healed - Epithelialized Wounding Event: Gradually  Appeared Status: Date Acquired: 12/24/2022 Comorbid Anemia, Lymphedema, Deep Vein Thrombosis, Hypertension, Weeks Of Treatment: 6 History: Received Radiation Clustered Wound: No Photos Wound Measurements Length: (cm) 0 % Reduction in Area: 100% Width: (cm) 0 % Reduction in Volume: 100% Depth: (cm) 0 Epithelialization: Large (67-100%) Area: (cm) 0 Tunneling: No Volume: (cm) 0 Undermining: No Wound Description Classification: Full Thickness Without Exposed Support Structures Foul Odor After Cleansing: No Wound Margin: Flat and Intact Slough/Fibrino No Exudate Amount: None Present Wound Bed Granulation Amount: None Present (0%) Exposed  in Area: 71.4% % Reduction in Volume: 42.8% Epithelialization: Small (1-33%) Tunneling: No Undermining: No [id number] Page 10 of 11 Wound Description Classification: Full Thickness Without Exposed Suppor Wound Margin: Distinct, outline attached Exudate Amount: Large Exudate Type: Serous Exudate Color: amber t Structures Foul Odor After Cleansing: No Slough/Fibrino Yes Wound Bed Granulation Amount: Medium (34-66%) Exposed Structure Granulation Quality: Pink Fascia Exposed: No Necrotic Amount:  Medium (34-66%) Fat Layer (Subcutaneous Tissue) Exposed: Yes Necrotic Quality: Adherent Slough Tendon Exposed: No Muscle Exposed: No Joint Exposed: No Bone Exposed: No Periwound Skin Texture Texture Color No Abnormalities Noted: No No Abnormalities Noted: No Callus: No Atrophie Blanche: No Crepitus: No Cyanosis: No Excoriation: No Ecchymosis: No Induration: No Erythema: No Rash: No Hemosiderin Staining: Yes Scarring: Yes Mottled: No Pallor: No Moisture Rubor: No No Abnormalities Noted: No Dry / Scaly: No Temperature / Pain Maceration: No Temperature: No Abnormality Treatment Notes Wound #7 (Lower Leg) Wound Laterality: Left, Medial Cleanser Soap and Water Discharge Instruction: May shower and wash wound with dial antibacterial soap and water prior to dressing change. Peri-Wound Care Sween Lotion (Moisturizing lotion) Discharge Instruction: Apply moisturizing lotion as directed Topical Primary Dressing Aquacel Ag Discharge Instruction: apply directly to wound bed. Secondary Dressing ABD Pad, 8x10 Discharge Instruction: Apply over primary dressing as directed. OptiLock Super Absorbent, 5x5.5 (in/in) Discharge Instruction: Apply directly to wound bed as directed Secured With Compression Wrap Urgo K2, (equivalent to a 4 layer) two layer compression system, regular Discharge Instruction: Apply Urgo K2 as directed (alternative to 4 layer compression). Compression Stockings Add-Ons Electronic Signature(s) Signed: 02/04/2023 2:48:04 PM By: [name] RN Entered By: [name] on 02/04/2023 11:15:32 Whittle, [name] ([id number]) [id number] Page 11 of 11 -------------------------------------------------------------------------------- Vitals Details Patient Name: Date of Service: [name], [name] 02/04/2023 2:00 PM Medical Record Number: [id number] Patient Account Number: [id number] Date of Birth/Sex: Treating RN: Nov 25, 1945 (77 y.o.  M) Primary Care Cheryn Lundquist: [name] Other Clinician: Referring Asia Favata: Treating Taiz Bickle/Extender: [name] Weeks in Treatment: 45 Vital Signs Time Taken: 14:00 Temperature (F): 98.4 Height (in): 74 Pulse (bpm): 92 Weight (lbs): 250 Respiratory Rate (breaths/min): 18 Body Mass Index (BMI): 32.1 Blood Pressure (mmHg): 187/70 Reference Range: 80 - 120 mg / dl Electronic Signature(s) Signed: 02/06/2023 12:48:07 PM By: Thayer Dallas Entered By: Thayer Dallas on 02/04/2023 11:00:34  Structure Necrotic Amount: None Present (0%) Fascia Exposed: No Fat Layer (Subcutaneous Tissue) Exposed: No Tendon Exposed: No Muscle Exposed: No Joint Exposed: No Bone Exposed: No Periwound Skin Texture Texture Color No Abnormalities Noted: Yes No Abnormalities Noted: Yes Moisture Temperature / Pain No Abnormalities Noted: Yes Temperature: No Abnormality Electronic Signature(s) Signed: 02/04/2023 2:48:04 PM By: [name] RN Entered By: [name] on 02/04/2023 11:21:52 -------------------------------------------------------------------------------- Wound Assessment Details Patient Name: Date of Service: [name], [name] 02/04/2023 2:00 PM Medical Record Number: [id number] Patient Account Number: [id number] Date of Birth/Sex: Treating RN: 04/03/1946 (77 y.o. M) Belardo, [name] ([id number]) [id number] Page 8 of 11 Primary Care Christelle Igoe: [name] Other Clinician: Referring Gadiel John: Treating Jhamal Plucinski/Extender: [name] Weeks in Treatment: 45 Wound Status Wound Number: 6 Primary Lymphedema Etiology: Wound Location: Right, Medial Lower Leg Wound Open Wounding Event: Gradually Appeared Status: Date Acquired: 03/05/2022 Comorbid Anemia, Lymphedema, Deep Vein Thrombosis,  Hypertension, Weeks Of Treatment: 45 History: Received Radiation Clustered Wound: Yes Photos Wound Measurements Length: (cm) 13 Width: (cm) 5 Depth: (cm) 0.8 Clustered Quantity: 1 Area: (cm) 51.051 Volume: (cm) 40.841 % Reduction in Area: 64.3% % Reduction in Volume: -185.7% Epithelialization: Small (1-33%) Tunneling: No Undermining: No Wound Description Classification: Full Thickness Without Exposed Sup Wound Margin: Distinct, outline attached Exudate Amount: Large Exudate Type: Serous Exudate Color: amber port Structures Foul Odor After Cleansing: No Slough/Fibrino Yes Wound Bed Granulation Amount: None Present (0%) Exposed Structure Necrotic Amount: Large (67-100%) Fascia Exposed: No Necrotic Quality: Adherent Slough Fat Layer (Subcutaneous Tissue) Exposed: Yes Tendon Exposed: No Muscle Exposed: No Joint Exposed: No Bone Exposed: No Periwound Skin Texture Texture Color No Abnormalities Noted: No No Abnormalities Noted: No Callus: No Atrophie Blanche: No Crepitus: No Cyanosis: No Excoriation: No Ecchymosis: No Induration: No Erythema: No Rash: No Hemosiderin Staining: Yes Scarring: Yes Mottled: No Pallor: No Moisture Rubor: No No Abnormalities Noted: No Dry / Scaly: No Temperature / Pain Maceration: No Temperature: No Abnormality Treatment Notes Wound #6 (Lower Leg) Wound Laterality: Right, Medial Cleanser Soap and Water Discharge Instruction: May shower and wash wound with dial antibacterial soap and water prior to dressing change. Peri-Wound Care [place], [name] ([id number]) [id number] Page 9 of 11 Sween Lotion (Moisturizing lotion) Discharge Instruction: Apply moisturizing lotion as directed Topical Primary Dressing Aquacel Ag Discharge Instruction: apply directly to wound bed. Secondary Dressing ABD Pad, 8x10 Discharge Instruction: Apply over primary dressing as directed. OptiLock Super Absorbent, 5x5.5  (in/in) Discharge Instruction: Apply directly to wound bed as directed Secured With Compression Wrap Urgo K2, (equivalent to a 4 layer) two layer compression system, regular Discharge Instruction: Apply Urgo K2 as directed (alternative to 4 layer compression). Compression Stockings Add-Ons Electronic Signature(s) Signed: 02/04/2023 2:48:04 PM By: [name] RN Entered By: [name] on 02/04/2023 11:14:37 -------------------------------------------------------------------------------- Wound Assessment Details Patient Name: Date of Service: [place], [name] 02/04/2023 2:00 PM Medical Record Number: [id number] Patient Account Number: [id number] Date of Birth/Sex: Treating RN: November 20, 1945 (77 y.o. M) Primary Care Ashonte Angelucci: [name] Other Clinician: Referring Analiza Cowger: Treating Marna Weniger/Extender: Leveda Anna, [name] Weeks in Treatment: 45 Wound Status Wound Number: 7 Primary Lymphedema Etiology: Wound Location: Left, Medial Lower Leg Wound Open Wounding Event: Gradually Appeared Status: Date Acquired: 03/05/2022 Comorbid Anemia, Lymphedema, Deep Vein Thrombosis, Hypertension, Weeks Of Treatment: 45 History: Received Radiation Clustered Wound: Yes Photos Wound Measurements Length: (cm) 3.3 Width: (cm) 5.2 Depth: (cm) 0.2 Clustered Quantity: 1 Area: (cm) 13.477 Volume: (cm) 2.695 [name], Timoth ([id number]) % Reduction  in Area: 71.4% % Reduction in Volume: 42.8% Epithelialization: Small (1-33%) Tunneling: No Undermining: No [id number] Page 10 of 11 Wound Description Classification: Full Thickness Without Exposed Suppor Wound Margin: Distinct, outline attached Exudate Amount: Large Exudate Type: Serous Exudate Color: amber t Structures Foul Odor After Cleansing: No Slough/Fibrino Yes Wound Bed Granulation Amount: Medium (34-66%) Exposed Structure Granulation Quality: Pink Fascia Exposed: No Necrotic Amount:  Medium (34-66%) Fat Layer (Subcutaneous Tissue) Exposed: Yes Necrotic Quality: Adherent Slough Tendon Exposed: No Muscle Exposed: No Joint Exposed: No Bone Exposed: No Periwound Skin Texture Texture Color No Abnormalities Noted: No No Abnormalities Noted: No Callus: No Atrophie Blanche: No Crepitus: No Cyanosis: No Excoriation: No Ecchymosis: No Induration: No Erythema: No Rash: No Hemosiderin Staining: Yes Scarring: Yes Mottled: No Pallor: No Moisture Rubor: No No Abnormalities Noted: No Dry / Scaly: No Temperature / Pain Maceration: No Temperature: No Abnormality Treatment Notes Wound #7 (Lower Leg) Wound Laterality: Left, Medial Cleanser Soap and Water Discharge Instruction: May shower and wash wound with dial antibacterial soap and water prior to dressing change. Peri-Wound Care Sween Lotion (Moisturizing lotion) Discharge Instruction: Apply moisturizing lotion as directed Topical Primary Dressing Aquacel Ag Discharge Instruction: apply directly to wound bed. Secondary Dressing ABD Pad, 8x10 Discharge Instruction: Apply over primary dressing as directed. OptiLock Super Absorbent, 5x5.5 (in/in) Discharge Instruction: Apply directly to wound bed as directed Secured With Compression Wrap Urgo K2, (equivalent to a 4 layer) two layer compression system, regular Discharge Instruction: Apply Urgo K2 as directed (alternative to 4 layer compression). Compression Stockings Add-Ons Electronic Signature(s) Signed: 02/04/2023 2:48:04 PM By: [name] RN Entered By: [name] on 02/04/2023 11:15:32 Whittle, [name] ([id number]) [id number] Page 11 of 11 -------------------------------------------------------------------------------- Vitals Details Patient Name: Date of Service: [name], [name] 02/04/2023 2:00 PM Medical Record Number: [id number] Patient Account Number: [id number] Date of Birth/Sex: Treating RN: Nov 25, 1945 (77 y.o.  M) Primary Care Cheryn Lundquist: [name] Other Clinician: Referring Asia Favata: Treating Taiz Bickle/Extender: [name] Weeks in Treatment: 45 Vital Signs Time Taken: 14:00 Temperature (F): 98.4 Height (in): 74 Pulse (bpm): 92 Weight (lbs): 250 Respiratory Rate (breaths/min): 18 Body Mass Index (BMI): 32.1 Blood Pressure (mmHg): 187/70 Reference Range: 80 - 120 mg / dl Electronic Signature(s) Signed: 02/06/2023 12:48:07 PM By: Thayer Dallas Entered By: Thayer Dallas on 02/04/2023 11:00:34

## 2023-02-11 ENCOUNTER — Encounter (HOSPITAL_BASED_OUTPATIENT_CLINIC_OR_DEPARTMENT_OTHER): Payer: Medicare Other | Admitting: Physician Assistant

## 2023-02-11 DIAGNOSIS — I87333 Chronic venous hypertension (idiopathic) with ulcer and inflammation of bilateral lower extremity: Secondary | ICD-10-CM | POA: Diagnosis not present

## 2023-02-11 NOTE — Progress Notes (Signed)
y.o. M) Primary Care [name] [name]: [name] [name] Other Clinician: Referring [name] [name]: Treating [name] [name]/Extender: [name] [name] in Treatment: 80 Active Problems Location of Pain Severity and  Description of Pain Patient Has Paino No Site Locations Pain Management and Medication Current Pain Management: Electronic Signature(s) Signed: 02/11/2023 4:28:55 PM By: Thayer Dallas Entered By: Thayer Dallas on 02/11/2023 14:32:40 -------------------------------------------------------------------------------- Patient/Caregiver Education Details Patient Name: Date of Service: [name] [name] [name] 9/25/2024andnbsp2:00 PM Medical Record Number: [id number] Patient Account Number: [id number] Date of Birth/Gender: Treating RN: February 01, 1946 (77 y.o. [name] [name] Primary Care Physician: [name] [name] Other Clinician: Referring Physician: Treating Physician/Extender: [name] in Treatment: 21 Education Assessment Education Provided To: Patient Education Topics Provided Wound/Skin Impairment: Methods: Explain/Verbal Responses: State content correctly [name] [name] Huh ([id number]) [phone].pdf Page 6 of 12 Electronic Signature(s) Signed: 02/11/2023 4:31:50 PM By: [name] RN Entered By: [name] on 02/11/2023 16:23:51 -------------------------------------------------------------------------------- Wound Assessment Details Patient Name: Date of Service: [name] [name] [name] 02/11/2023 2:00 PM Medical Record Number: [id number] Patient Account Number: [id number] Date of Birth/Sex: Treating RN: January 27, 1946 (77 y.o. M) Primary Care [name] [name]: [name] [name] Other Clinician: Referring [name] [name]: Treating [name] [name]/Extender: [name] [name], [name] [name] [name] in Treatment: 46 Wound Status Wound Number: 12 Primary Lymphedema Etiology: Wound Location: Left, Anterior Lower Leg Wound Open Wounding Event: Frostbite Status: Date Acquired: 12/17/2022 Comorbid Anemia, Lymphedema, Deep Vein Thrombosis, Hypertension, [name] Of Treatment: 8 History: Received Radiation Clustered Wound: No Photos Wound Measurements Length: (cm)  1 Width: (cm) 1 Depth: (cm) 0.1 Area: (cm) 0.785 Volume: (cm) 0.079 % Reduction in Area: 77.8% % Reduction in Volume: 88.8% Epithelialization: None Tunneling: No Undermining: No Wound Description Classification: Full Thickness Without Exposed Support Wound Margin: Distinct, outline attached Exudate Amount: Large Exudate Type: Serous Exudate Color: amber Structures Foul Odor After Cleansing: No Slough/Fibrino Yes Wound Bed Granulation Amount: None Present (0%) Exposed Structure Necrotic Amount: Large (67-100%) Fascia Exposed: No Necrotic Quality: Adherent Slough Fat Layer (Subcutaneous Tissue) Exposed: Yes Tendon Exposed: No Muscle Exposed: No Joint Exposed: No Bone Exposed: No Periwound Skin Texture Texture Color No Abnormalities Noted: No No Abnormalities Noted: No Callus: No Atrophie Blanche: No Crepitus: No Cyanosis: No Excoriation: No Ecchymosis: No [name] [name] ([id number]) [id number] Page 7 of 12 Induration: No Erythema: No Rash: No Hemosiderin Staining: Yes Scarring: No Mottled: No Pallor: No Moisture Rubor: No No Abnormalities Noted: No Dry / Scaly: No Temperature / Pain Maceration: No Temperature: No Abnormality Treatment Notes Wound #12 (Lower Leg) Wound Laterality: Left, Anterior Cleanser Soap and Water Discharge Instruction: May shower and wash wound with dial antibacterial soap and water prior to dressing change. Peri-Wound Care Sween Lotion (Moisturizing lotion) Discharge Instruction: Apply moisturizing lotion as directed Topical Primary Dressing Aquacel Ag Discharge Instruction: apply directly to wound bed. Secondary Dressing ABD Pad, 8x10 Discharge Instruction: Apply over primary dressing as directed. OptiLock Super Absorbent, 5x5.5 (in/in) Discharge Instruction: Apply directly to wound bed as directed Secured With Compression Wrap Urgo K2, (equivalent to a 4 layer) two layer compression system,  regular Discharge Instruction: Apply Urgo K2 as directed (alternative to 4 layer compression). Compression Stockings Add-Ons Electronic Signature(s) Signed: 02/11/2023 4:31:50 PM By: [name] RN Entered By: [name] on 02/11/2023 14:59:21 -------------------------------------------------------------------------------- Wound Assessment Details Patient Name: Date of Service: [name] [name] [name] 02/11/2023 2:00 PM Medical Record Number: [id number] Patient Account Number: [id number] Date of Birth/Sex: Treating RN: 07-27-1945 (77 y.o. M) Primary Care [name] [name]: [name] [name] Other Clinician: Referring [name] [name]:  [name], RN Compression Type: Four Layer Post Procedure Diagnosis [name] [name] [name] [id number] Page 3 of 12 Same as Pre-procedure Electronic Signature(s) Signed: 02/11/2023 4:31:50 PM By: [name] RN Entered By: [name] on 02/11/2023 15:32:14 -------------------------------------------------------------------------------- Encounter Discharge Information Details Patient Name: Date of Service: [name] [name] [name] 02/11/2023 2:00 PM Medical Record Number: [id number] Patient Account Number: [id number] Date of Birth/Sex: Treating RN: July 05, 1945 (77 y.o. [name] [name] Primary Care [name] [name]: [name] [name] Other Clinician: Referring [name] [name]: Treating [name] [name]/Extender: [name] in Treatment: 31 Encounter Discharge Information Items Discharge Condition: Stable Ambulatory Status: Cane Discharge Destination: Home Transportation: Private Auto Accompanied By: self Schedule Follow-up Appointment: Yes Clinical Summary of Care: Patient Declined Electronic Signature(s) Signed: 02/11/2023 4:31:50 PM By: [name]  RN Entered By: [name] on 02/11/2023 16:25:27 -------------------------------------------------------------------------------- Lower Extremity Assessment Details Patient Name: Date of Service: [name] [name] [name] 02/11/2023 2:00 PM Medical Record Number: [id number] Patient Account Number: [id number] Date of Birth/Sex: Treating RN: [birth year] (77 y.o. M) Primary Care Cam Harnden: [name] [name] Other Clinician: Referring Gracieann Stannard: Treating Taym Twist/Extender: [name] [name], [name] [name] [name] in Treatment: 46 Edema Assessment Assessed: [Left: No] [Right: No] Edema: [Left: Yes] [Right: Yes] Calf Left: Right: Point of Measurement: 40 cm From Medial Instep 54.3 cm 61 cm Ankle Left: Right: Point of Measurement: 13 cm From Medial Instep 40.5 cm 41.5 cm Vascular Assessment Extremity colors, hair growth, and conditions: Extremity Color: [Left:Hyperpigmented] [Right:Hyperpigmented] Hair Growth on Extremity: [Left:No] Temperature of Extremity: [Left:Warm] [Right:Warm] Rossetti, Syris ([id number]) Capillary Refill: [Left:> 3 seconds] [Right:> 3 seconds] Dependent Rubor: [Left:No Yes] [Right:No Yes] Toe Nail Assessment Left: Right: Thick: Yes Yes Discolored: Yes Yes Deformed: Yes Yes Improper Length and Hygiene: Yes Yes Electronic Signature(s) Signed: 02/11/2023 4:28:55 PM By: Thayer Dallas Entered By: Thayer Dallas on 02/11/2023 14:33:23 -------------------------------------------------------------------------------- Multi-Disciplinary Care Plan Details Patient Name: Date of Service: [name] [name] [name] 02/11/2023 2:00 PM Medical Record Number: [id number] Patient Account Number: [id number] Date of Birth/Sex: Treating RN: 02-Nov-1945 (77 y.o. [name] [name] Primary Care Dashonda Bonneau: [name] [name] Other Clinician: Referring Sumer Moorehouse: Treating Dolphus Linch/Extender: [name] in Treatment: 47 Multidisciplinary Care Plan reviewed with physician Active  Inactive Wound/Skin Impairment Nursing Diagnoses: Impaired tissue integrity Knowledge deficit related to ulceration/compromised skin integrity Goals: Patient will have a decrease in wound volume by X% from date: (specify in notes) Date Initiated: 03/26/2022 Target Resolution Date: 04/17/2025 Goal Status: Active Patient/caregiver will verbalize understanding of skin care regimen Date Initiated: 03/26/2022 Target Resolution Date: 04/17/2025 Goal Status: Active Ulcer/skin breakdown will have a volume reduction of 30% by week 4 Date Initiated: 03/26/2022 Date Inactivated: 05/21/2022 Target Resolution Date: 05/17/2022 Unmet Reason: see wound Goal Status: Unmet measurement. Ulcer/skin breakdown will have a volume reduction of 50% by week 8 Date Initiated: 03/26/2022 Date Inactivated: 05/21/2022 Target Resolution Date: 05/17/2022 Unmet Reason: see wound Goal Status: Unmet measurement. Interventions: Assess patient/caregiver ability to obtain necessary supplies Assess patient/caregiver ability to perform ulcer/skin care regimen upon admission and as needed Assess ulceration(s) every visit Notes: Patient stated today, "I will take my fluid pill or pump not do both." Jaxie Racanelli made aware. Electronic Signature(s) Signed: 02/11/2023 4:31:50 PM By: [name] RN Entered By: [name] on 02/11/2023 16:23:18 Arman, [name] Huh ([id number]) [id number] Page 5 of 12 -------------------------------------------------------------------------------- Pain Assessment Details Patient Name: Date of Service: [place], [name] [name] 02/11/2023 2:00 PM Medical Record Number: [id number] Patient Account Number: [id number] Date of Birth/Sex: Treating RN: [birth year] (76  y.o. M) Primary Care [name] [name]: [name] [name] Other Clinician: Referring [name] [name]: Treating [name] [name]/Extender: [name] [name] in Treatment: 80 Active Problems Location of Pain Severity and  Description of Pain Patient Has Paino No Site Locations Pain Management and Medication Current Pain Management: Electronic Signature(s) Signed: 02/11/2023 4:28:55 PM By: Thayer Dallas Entered By: Thayer Dallas on 02/11/2023 14:32:40 -------------------------------------------------------------------------------- Patient/Caregiver Education Details Patient Name: Date of Service: [name] [name] [name] 9/25/2024andnbsp2:00 PM Medical Record Number: [id number] Patient Account Number: [id number] Date of Birth/Gender: Treating RN: February 01, 1946 (77 y.o. [name] [name] Primary Care Physician: [name] [name] Other Clinician: Referring Physician: Treating Physician/Extender: [name] in Treatment: 21 Education Assessment Education Provided To: Patient Education Topics Provided Wound/Skin Impairment: Methods: Explain/Verbal Responses: State content correctly [name] [name] Huh ([id number]) [phone].pdf Page 6 of 12 Electronic Signature(s) Signed: 02/11/2023 4:31:50 PM By: [name] RN Entered By: [name] on 02/11/2023 16:23:51 -------------------------------------------------------------------------------- Wound Assessment Details Patient Name: Date of Service: [name] [name] [name] 02/11/2023 2:00 PM Medical Record Number: [id number] Patient Account Number: [id number] Date of Birth/Sex: Treating RN: January 27, 1946 (77 y.o. M) Primary Care [name] [name]: [name] [name] Other Clinician: Referring [name] [name]: Treating [name] [name]/Extender: [name] [name], [name] [name] [name] in Treatment: 46 Wound Status Wound Number: 12 Primary Lymphedema Etiology: Wound Location: Left, Anterior Lower Leg Wound Open Wounding Event: Frostbite Status: Date Acquired: 12/17/2022 Comorbid Anemia, Lymphedema, Deep Vein Thrombosis, Hypertension, [name] Of Treatment: 8 History: Received Radiation Clustered Wound: No Photos Wound Measurements Length: (cm)  1 Width: (cm) 1 Depth: (cm) 0.1 Area: (cm) 0.785 Volume: (cm) 0.079 % Reduction in Area: 77.8% % Reduction in Volume: 88.8% Epithelialization: None Tunneling: No Undermining: No Wound Description Classification: Full Thickness Without Exposed Support Wound Margin: Distinct, outline attached Exudate Amount: Large Exudate Type: Serous Exudate Color: amber Structures Foul Odor After Cleansing: No Slough/Fibrino Yes Wound Bed Granulation Amount: None Present (0%) Exposed Structure Necrotic Amount: Large (67-100%) Fascia Exposed: No Necrotic Quality: Adherent Slough Fat Layer (Subcutaneous Tissue) Exposed: Yes Tendon Exposed: No Muscle Exposed: No Joint Exposed: No Bone Exposed: No Periwound Skin Texture Texture Color No Abnormalities Noted: No No Abnormalities Noted: No Callus: No Atrophie Blanche: No Crepitus: No Cyanosis: No Excoriation: No Ecchymosis: No [name] [name] ([id number]) [id number] Page 7 of 12 Induration: No Erythema: No Rash: No Hemosiderin Staining: Yes Scarring: No Mottled: No Pallor: No Moisture Rubor: No No Abnormalities Noted: No Dry / Scaly: No Temperature / Pain Maceration: No Temperature: No Abnormality Treatment Notes Wound #12 (Lower Leg) Wound Laterality: Left, Anterior Cleanser Soap and Water Discharge Instruction: May shower and wash wound with dial antibacterial soap and water prior to dressing change. Peri-Wound Care Sween Lotion (Moisturizing lotion) Discharge Instruction: Apply moisturizing lotion as directed Topical Primary Dressing Aquacel Ag Discharge Instruction: apply directly to wound bed. Secondary Dressing ABD Pad, 8x10 Discharge Instruction: Apply over primary dressing as directed. OptiLock Super Absorbent, 5x5.5 (in/in) Discharge Instruction: Apply directly to wound bed as directed Secured With Compression Wrap Urgo K2, (equivalent to a 4 layer) two layer compression system,  regular Discharge Instruction: Apply Urgo K2 as directed (alternative to 4 layer compression). Compression Stockings Add-Ons Electronic Signature(s) Signed: 02/11/2023 4:31:50 PM By: [name] RN Entered By: [name] on 02/11/2023 14:59:21 -------------------------------------------------------------------------------- Wound Assessment Details Patient Name: Date of Service: [name] [name] [name] 02/11/2023 2:00 PM Medical Record Number: [id number] Patient Account Number: [id number] Date of Birth/Sex: Treating RN: 07-27-1945 (77 y.o. M) Primary Care [name] [name]: [name] [name] Other Clinician: Referring [name] [name]:  Treating Hiroto Saltzman/Extender: [name] [name], [name] [name] [name] in Treatment: 46 Wound Status Wound Number: 14 Primary Lymphedema Etiology: Wound Location: Right, Anterior Lower Leg Wound Open Wounding Event: Gradually Appeared Status: Date Acquired: 02/05/2023 Comorbid Anemia, Lymphedema, Deep Vein Thrombosis, Hypertension, [name] Of Treatment: 0 History: Received Radiation Clustered Wound: Yes Photos Dearmond, [name] [name] [id number] Page 8 of 12 Wound Measurements Length: (cm) Width: (cm) Depth: (cm) Clustered Quantity: Area: (cm) Volume: (cm) 1.5 % Reduction in Area: 2.5 % Reduction in Volume: 0.1 Epithelialization: None 2 Tunneling: No 2.945 Undermining: No 0.295 Wound Description Classification: Partial Thickness Exudate Amount: Medium Exudate Type: Serosanguineous Exudate Color: red, brown Foul Odor After Cleansing: No Slough/Fibrino Yes Wound Bed Granulation Amount: None Present (0%) Exposed Structure Necrotic Amount: Large (67-100%) Fascia Exposed: No Necrotic Quality: Adherent Slough Fat Layer (Subcutaneous Tissue) Exposed: Yes Tendon Exposed: No Muscle Exposed: No Joint Exposed: No Bone Exposed: No Periwound Skin Texture Texture Color No Abnormalities Noted: No No Abnormalities Noted: No Callus:  No Atrophie Blanche: No Crepitus: No Cyanosis: No Excoriation: No Ecchymosis: No Induration: No Erythema: No Rash: No Hemosiderin Staining: No Scarring: No Mottled: No Pallor: No Moisture Rubor: No No Abnormalities Noted: No Dry / Scaly: No Temperature / Pain Maceration: No Temperature: No Abnormality Treatment Notes Wound #14 (Lower Leg) Wound Laterality: Right, Anterior Cleanser Peri-Wound Care Topical Primary Dressing Secondary Dressing Secured With Compression Wrap Compression Stockings Add-Ons Electronic Signature(s) Signed: 02/11/2023 4:31:50 PM By: [name] RN Entered By: [name] on 02/11/2023 14:59:37 [name] [name] Huh ([id number]) [id number] Page 9 of 12 -------------------------------------------------------------------------------- Wound Assessment Details Patient Name: Date of Service: [name] [name] [name] 02/11/2023 2:00 PM Medical Record Number: [id number] Patient Account Number: [id number] Date of Birth/Sex: Treating RN: [birth year] (77 y.o. M) Primary Care Aiken Withem: [name] [name] Other Clinician: Referring Jeanette Rauth: Treating Hortencia Martire/Extender: [name] [name], [name] [name] [name] in Treatment: 46 Wound Status Wound Number: 6 Primary Lymphedema Etiology: Wound Location: Right, Medial Lower Leg Wound Open Wounding Event: Gradually Appeared Status: Date Acquired: 03/05/2022 Comorbid Anemia, Lymphedema, Deep Vein Thrombosis, Hypertension, [name] Of Treatment: 46 History: Received Radiation Clustered Wound: Yes Photos Wound Measurements Length: (cm) 1 Width: (cm) 1 Depth: (cm) 0 Clustered Quantity: 1 Area: (cm) Volume: (cm) 5 % Reduction in Area: -7.1% 3 % Reduction in Volume: -757.2% .8 Epithelialization: Small (1-33%) Tunneling: No 153.153 Undermining: No 122.522 Wound Description Classification: Full Thickness Without Exposed Sup Wound Margin: Distinct, outline attached Exudate Amount:  Large Exudate Type: Serous Exudate Color: amber port Structures Foul Odor After Cleansing: No Slough/Fibrino Yes Wound Bed Granulation Amount: None Present (0%) Exposed Structure Necrotic Amount: Large (67-100%) Fascia Exposed: No Necrotic Quality: Adherent Slough Fat Layer (Subcutaneous Tissue) Exposed: Yes Tendon Exposed: No Muscle Exposed: No Joint Exposed: No Bone Exposed: No Periwound Skin Texture Texture Color No Abnormalities Noted: No No Abnormalities Noted: No Callus: No Atrophie Blanche: No Crepitus: No Cyanosis: No Excoriation: No Ecchymosis: No Induration: No Erythema: No Rash: No Hemosiderin Staining: Yes Scarring: Yes Mottled: No Pallor: No Moisture Rubor: No No Abnormalities Noted: No Kuriakose, Gerlad ([id number]) [id number] Page 10 of 12 Dry / Scaly: No Temperature / Pain Maceration: No Temperature: No Abnormality Treatment Notes Wound #6 (Lower Leg) Wound Laterality: Right, Medial Cleanser Soap and Water Discharge Instruction: May shower and wash wound with dial antibacterial soap and water prior to dressing change. Peri-Wound Care Sween Lotion (Moisturizing lotion) Discharge Instruction: Apply moisturizing lotion as directed Topical Primary Dressing Aquacel Ag Discharge Instruction: apply directly to wound bed. Secondary Dressing ABD  Treating Hiroto Saltzman/Extender: [name] [name], [name] [name] [name] in Treatment: 46 Wound Status Wound Number: 14 Primary Lymphedema Etiology: Wound Location: Right, Anterior Lower Leg Wound Open Wounding Event: Gradually Appeared Status: Date Acquired: 02/05/2023 Comorbid Anemia, Lymphedema, Deep Vein Thrombosis, Hypertension, [name] Of Treatment: 0 History: Received Radiation Clustered Wound: Yes Photos Dearmond, [name] [name] [id number] Page 8 of 12 Wound Measurements Length: (cm) Width: (cm) Depth: (cm) Clustered Quantity: Area: (cm) Volume: (cm) 1.5 % Reduction in Area: 2.5 % Reduction in Volume: 0.1 Epithelialization: None 2 Tunneling: No 2.945 Undermining: No 0.295 Wound Description Classification: Partial Thickness Exudate Amount: Medium Exudate Type: Serosanguineous Exudate Color: red, brown Foul Odor After Cleansing: No Slough/Fibrino Yes Wound Bed Granulation Amount: None Present (0%) Exposed Structure Necrotic Amount: Large (67-100%) Fascia Exposed: No Necrotic Quality: Adherent Slough Fat Layer (Subcutaneous Tissue) Exposed: Yes Tendon Exposed: No Muscle Exposed: No Joint Exposed: No Bone Exposed: No Periwound Skin Texture Texture Color No Abnormalities Noted: No No Abnormalities Noted: No Callus:  No Atrophie Blanche: No Crepitus: No Cyanosis: No Excoriation: No Ecchymosis: No Induration: No Erythema: No Rash: No Hemosiderin Staining: No Scarring: No Mottled: No Pallor: No Moisture Rubor: No No Abnormalities Noted: No Dry / Scaly: No Temperature / Pain Maceration: No Temperature: No Abnormality Treatment Notes Wound #14 (Lower Leg) Wound Laterality: Right, Anterior Cleanser Peri-Wound Care Topical Primary Dressing Secondary Dressing Secured With Compression Wrap Compression Stockings Add-Ons Electronic Signature(s) Signed: 02/11/2023 4:31:50 PM By: [name] RN Entered By: [name] on 02/11/2023 14:59:37 [name] [name] Huh ([id number]) [id number] Page 9 of 12 -------------------------------------------------------------------------------- Wound Assessment Details Patient Name: Date of Service: [name] [name] [name] 02/11/2023 2:00 PM Medical Record Number: [id number] Patient Account Number: [id number] Date of Birth/Sex: Treating RN: [birth year] (77 y.o. M) Primary Care Aiken Withem: [name] [name] Other Clinician: Referring Jeanette Rauth: Treating Hortencia Martire/Extender: [name] [name], [name] [name] [name] in Treatment: 46 Wound Status Wound Number: 6 Primary Lymphedema Etiology: Wound Location: Right, Medial Lower Leg Wound Open Wounding Event: Gradually Appeared Status: Date Acquired: 03/05/2022 Comorbid Anemia, Lymphedema, Deep Vein Thrombosis, Hypertension, [name] Of Treatment: 46 History: Received Radiation Clustered Wound: Yes Photos Wound Measurements Length: (cm) 1 Width: (cm) 1 Depth: (cm) 0 Clustered Quantity: 1 Area: (cm) Volume: (cm) 5 % Reduction in Area: -7.1% 3 % Reduction in Volume: -757.2% .8 Epithelialization: Small (1-33%) Tunneling: No 153.153 Undermining: No 122.522 Wound Description Classification: Full Thickness Without Exposed Sup Wound Margin: Distinct, outline attached Exudate Amount:  Large Exudate Type: Serous Exudate Color: amber port Structures Foul Odor After Cleansing: No Slough/Fibrino Yes Wound Bed Granulation Amount: None Present (0%) Exposed Structure Necrotic Amount: Large (67-100%) Fascia Exposed: No Necrotic Quality: Adherent Slough Fat Layer (Subcutaneous Tissue) Exposed: Yes Tendon Exposed: No Muscle Exposed: No Joint Exposed: No Bone Exposed: No Periwound Skin Texture Texture Color No Abnormalities Noted: No No Abnormalities Noted: No Callus: No Atrophie Blanche: No Crepitus: No Cyanosis: No Excoriation: No Ecchymosis: No Induration: No Erythema: No Rash: No Hemosiderin Staining: Yes Scarring: Yes Mottled: No Pallor: No Moisture Rubor: No No Abnormalities Noted: No Kuriakose, Gerlad ([id number]) [id number] Page 10 of 12 Dry / Scaly: No Temperature / Pain Maceration: No Temperature: No Abnormality Treatment Notes Wound #6 (Lower Leg) Wound Laterality: Right, Medial Cleanser Soap and Water Discharge Instruction: May shower and wash wound with dial antibacterial soap and water prior to dressing change. Peri-Wound Care Sween Lotion (Moisturizing lotion) Discharge Instruction: Apply moisturizing lotion as directed Topical Primary Dressing Aquacel Ag Discharge Instruction: apply directly to wound bed. Secondary Dressing ABD

## 2023-02-11 NOTE — Progress Notes (Addendum)
Underdown, [name] [id number] Page 1 of 13 Visit Report for 02/11/2023 Chief Complaint Document Details Patient Name: Date of Service: Lansky, [name] 02/11/2023 2:00 PM Medical Record Number: [id number] Patient Account Number: [id number] Date of Birth/Sex: Treating RN: [birth year] (77 y.o. M) Primary Care Provider: [name] Other Clinician: Referring Provider: Treating Provider/Extender: [name] Weeks in Treatment: 51 Information Obtained from: Patient Chief Complaint 02/23/2020; patient is here for wounds on his bilateral lower legs in the setting of severe lymphedema 03/26/2022; patient is here for wounds on his bilateral lower legs medial aspect Electronic Signature(s) Signed: 02/11/2023 2:13:01 PM By: [name] PA-C Entered By: [name] on 02/11/2023 14:13:01 -------------------------------------------------------------------------------- HPI Details Patient Name: Date of Service: Rodger, [name] 02/11/2023 2:00 PM Medical Record Number: [id number] Patient Account Number: [id number] Date of Birth/Sex: Treating RN: January 29, 1946 (77 y.o. M) Primary Care Provider: [name] Other Clinician: Referring Provider: Treating Provider/Extender: [name] in Treatment: 52 History of Present Illness HPI Description: ADMISSION 02/23/2020 Patient is a 77 year old man who lives in [place] who arrives accompanied by his wife. He has a history of chronic lymphedema and venous insufficiency in his bilateral lower legs which may have something to do that with having a history of DVT as well as being treated for prostate cancer. In any case he recently got compression pumps at home but compliance has been an issue here. He has compression stockings however they are probably not sufficient enough to control swelling. They tell us that things deteriorated for him in late August he was admitted  to [facility] for 7 days. This was with cellulitis I think of his bilateral lower legs. Discharge he was noted to have wounds on his bilateral lower legs. He was discharged on Bactrim. They tried to get him home health through [facility] part [facility] of course they declined him. His wife is been wrapping these applying some form of silver foam dressing. He has a history of wounds before although nothing that would not heal with basic home topical dressings. He has 2 areas on the left medial, left anterior and left lateral and a smaller area on the right medial. All of these have considerable depth. Past medical history includes iron deficiency anemia, lymphedema followed by the rehab center at [facility] with lymphedema wraps I believe, DVT on chronic anticoagulation, prostate cancer, chronic venous insufficiency, hypertension. As mentioned he has compression pumps but does not use them. ABIs in our clinic were noncompressible bilaterally 10/14; patient with severe bilateral lymphedema right greater than left. He came in with bilateral lower extremity wounds left greater than right. Even though the right side has more of the edema most of the wounds here almost closed on the right medial. He has 3 remaining wounds on the left We have been using silver alginate under 4-layer compression I have been trying to get him to be compliant with his external compression pumps 10/21; patient with 3 small wounds on the left leg and 1 on the right medial in the setting of severe lymphedema and chronic venous insufficiency. We have been using silver alginate under 4-layer compression he is using his external compression pumps twice a day 11/4; ARTERIAL STUDIES on the right show an ABI of 1.02 TBI of 0.858 with biphasic waveforms on the left 0.98 with a TBI of 0.55 and biphasic waveforms. Does not look like he has significant arterial disease. We are treating him for  like that he is volume/fluid overloaded and needs more aggressive removal of this excess fluid to see any type of improvement. We will see patient back for reevaluation in 1 week here in the clinic. If anything worsens or changes patient will contact our office for additional recommendations. Electronic Signature(s) Signed: 02/11/2023 3:51:48 PM By: [name] PA-C Entered By: [name] on 02/11/2023 15:51:47 -------------------------------------------------------------------------------- SuperBill Details Patient Name: Date of Service: Wysong, [name] 02/11/2023 Medical Record Number: [id number] Patient Account Number: [id number] Date of Birth/Sex: Treating RN: November 17, 1945 (77 y.o. 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 I89.0 Lymphedema, not elsewhere classified  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 of other part of right lower leg with other specified severity Quantity: 1 Electronic Signature(s) Signed: 02/17/2023 12:36:04 PM By: Pearletha Alfred Signed: 02/17/2023 2:40:07 PM By: [name] PA-C Previous Signature: 02/11/2023 3:51:59 PM Version By: [name] PA-C Entered By: Pearletha Alfred on 02/17/2023 12:36:04  and overall I do not know exactly what he did different other than he tells me that he "has been taking his fluid pills every day instead of every other day. Obviously this is needed. I was under the understanding previously that he was taking this every day but I think he may also not been pumping every day like he was telling me but I cannot be sure this either way he does seem to be doing what he supposed to do at this point. 02-11-2023 upon evaluation today patient unfortunately appears to be about as bad today as he appeared good last week. I am not sure what happened other than the fact he did take his wrap off the right leg he tells me it got so tight that he could not feel his toes they are getting very numb. Subsequently he tells me that it was difficulty with driving therefore he took it off. With that being said I do believe that he may need to go to the ER for evaluation and treatment I feel he may need to be admitted to get some of the fluid off of his legs. I think that he is volume overloaded and at this point I do not think there is much working to be able to do to manage this outpatient we have tried pretty much everything that I am going to do. Electronic Signature(s) Signed: 02/11/2023 3:49:18 PM By: [name] PA-C Entered By: [name] on 02/11/2023 15:49:18 -------------------------------------------------------------------------------- Physical Exam Details Patient Name: Date of Service: Kilian, [name] 02/11/2023 2:00 PM Medical Record Number: [id number] Patient Account Number: [id number] Date of Birth/Sex: Treating RN: 1945/08/22 (77 y.o. M) Primary  Care Provider: [name] Other Clinician: Referring Provider: Treating Provider/Extender: [name] Weeks in Treatment: 22 Constitutional Obese and well-hydrated in no acute distress. Respiratory normal breathing without difficulty. Psychiatric this patient is able to make decisions and demonstrates good insight into disease process. Alert and Oriented x 3. pleasant and cooperative. Notes Upon inspection patient's wound bed actually showed signs of doing much worse weeping over the legs in general there is not a specific wound per se with that being said he in general just does not seem to be doing as well as replacing. he is really I am going to suggest based on what we are seeing at this point that we have the patient go to the ER for further evaluation and treatment including hopefully IV Lasix to get fluid off so that he can try to see significant improvements going forward here. Dorminey, [name] ([id number]) [id number] Page 5 of 13 Electronic Signature(s) Signed: 02/11/2023 3:49:46 PM By: [name] PA-C Entered By: [name] on 02/11/2023 15:49:46 -------------------------------------------------------------------------------- Physician Orders Details Patient Name: Date of Service: Daleo, [name] 02/11/2023 2:00 PM Medical Record Number: [id number] Patient Account Number: [id number] Date of Birth/Sex: Treating RN: Jan 11, 1946 (77 y.o. 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 [name] PA Wednesday ****extra time 60 minutes****  and overall I do not know exactly what he did different other than he tells me that he "has been taking his fluid pills every day instead of every other day. Obviously this is needed. I was under the understanding previously that he was taking this every day but I think he may also not been pumping every day like he was telling me but I cannot be sure this either way he does seem to be doing what he supposed to do at this point. 02-11-2023 upon evaluation today patient unfortunately appears to be about as bad today as he appeared good last week. I am not sure what happened other than the fact he did take his wrap off the right leg he tells me it got so tight that he could not feel his toes they are getting very numb. Subsequently he tells me that it was difficulty with driving therefore he took it off. With that being said I do believe that he may need to go to the ER for evaluation and treatment I feel he may need to be admitted to get some of the fluid off of his legs. I think that he is volume overloaded and at this point I do not think there is much working to be able to do to manage this outpatient we have tried pretty much everything that I am going to do. Electronic Signature(s) Signed: 02/11/2023 3:49:18 PM By: [name] PA-C Entered By: [name] on 02/11/2023 15:49:18 -------------------------------------------------------------------------------- Physical Exam Details Patient Name: Date of Service: Kilian, [name] 02/11/2023 2:00 PM Medical Record Number: [id number] Patient Account Number: [id number] Date of Birth/Sex: Treating RN: 1945/08/22 (77 y.o. M) Primary  Care Provider: [name] Other Clinician: Referring Provider: Treating Provider/Extender: [name] Weeks in Treatment: 22 Constitutional Obese and well-hydrated in no acute distress. Respiratory normal breathing without difficulty. Psychiatric this patient is able to make decisions and demonstrates good insight into disease process. Alert and Oriented x 3. pleasant and cooperative. Notes Upon inspection patient's wound bed actually showed signs of doing much worse weeping over the legs in general there is not a specific wound per se with that being said he in general just does not seem to be doing as well as replacing. he is really I am going to suggest based on what we are seeing at this point that we have the patient go to the ER for further evaluation and treatment including hopefully IV Lasix to get fluid off so that he can try to see significant improvements going forward here. Dorminey, [name] ([id number]) [id number] Page 5 of 13 Electronic Signature(s) Signed: 02/11/2023 3:49:46 PM By: [name] PA-C Entered By: [name] on 02/11/2023 15:49:46 -------------------------------------------------------------------------------- Physician Orders Details Patient Name: Date of Service: Daleo, [name] 02/11/2023 2:00 PM Medical Record Number: [id number] Patient Account Number: [id number] Date of Birth/Sex: Treating RN: Jan 11, 1946 (77 y.o. 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 [name] PA Wednesday ****extra time 60 minutes****  like that he is volume/fluid overloaded and needs more aggressive removal of this excess fluid to see any type of improvement. We will see patient back for reevaluation in 1 week here in the clinic. If anything worsens or changes patient will contact our office for additional recommendations. Electronic Signature(s) Signed: 02/11/2023 3:51:48 PM By: [name] PA-C Entered By: [name] on 02/11/2023 15:51:47 -------------------------------------------------------------------------------- SuperBill Details Patient Name: Date of Service: Wysong, [name] 02/11/2023 Medical Record Number: [id number] Patient Account Number: [id number] Date of Birth/Sex: Treating RN: November 17, 1945 (77 y.o. 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 I89.0 Lymphedema, not elsewhere classified  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 of other part of right lower leg with other specified severity Quantity: 1 Electronic Signature(s) Signed: 02/17/2023 12:36:04 PM By: Pearletha Alfred Signed: 02/17/2023 2:40:07 PM By: [name] PA-C Previous Signature: 02/11/2023 3:51:59 PM Version By: [name] PA-C Entered By: Pearletha Alfred on 02/17/2023 12:36:04  Underdown, [name] [id number] Page 1 of 13 Visit Report for 02/11/2023 Chief Complaint Document Details Patient Name: Date of Service: Lansky, [name] 02/11/2023 2:00 PM Medical Record Number: [id number] Patient Account Number: [id number] Date of Birth/Sex: Treating RN: [birth year] (77 y.o. M) Primary Care Provider: [name] Other Clinician: Referring Provider: Treating Provider/Extender: [name] Weeks in Treatment: 51 Information Obtained from: Patient Chief Complaint 02/23/2020; patient is here for wounds on his bilateral lower legs in the setting of severe lymphedema 03/26/2022; patient is here for wounds on his bilateral lower legs medial aspect Electronic Signature(s) Signed: 02/11/2023 2:13:01 PM By: [name] PA-C Entered By: [name] on 02/11/2023 14:13:01 -------------------------------------------------------------------------------- HPI Details Patient Name: Date of Service: Rodger, [name] 02/11/2023 2:00 PM Medical Record Number: [id number] Patient Account Number: [id number] Date of Birth/Sex: Treating RN: January 29, 1946 (77 y.o. M) Primary Care Provider: [name] Other Clinician: Referring Provider: Treating Provider/Extender: [name] in Treatment: 52 History of Present Illness HPI Description: ADMISSION 02/23/2020 Patient is a 77 year old man who lives in [place] who arrives accompanied by his wife. He has a history of chronic lymphedema and venous insufficiency in his bilateral lower legs which may have something to do that with having a history of DVT as well as being treated for prostate cancer. In any case he recently got compression pumps at home but compliance has been an issue here. He has compression stockings however they are probably not sufficient enough to control swelling. They tell us that things deteriorated for him in late August he was admitted  to [facility] for 7 days. This was with cellulitis I think of his bilateral lower legs. Discharge he was noted to have wounds on his bilateral lower legs. He was discharged on Bactrim. They tried to get him home health through [facility] part [facility] of course they declined him. His wife is been wrapping these applying some form of silver foam dressing. He has a history of wounds before although nothing that would not heal with basic home topical dressings. He has 2 areas on the left medial, left anterior and left lateral and a smaller area on the right medial. All of these have considerable depth. Past medical history includes iron deficiency anemia, lymphedema followed by the rehab center at [facility] with lymphedema wraps I believe, DVT on chronic anticoagulation, prostate cancer, chronic venous insufficiency, hypertension. As mentioned he has compression pumps but does not use them. ABIs in our clinic were noncompressible bilaterally 10/14; patient with severe bilateral lymphedema right greater than left. He came in with bilateral lower extremity wounds left greater than right. Even though the right side has more of the edema most of the wounds here almost closed on the right medial. He has 3 remaining wounds on the left We have been using silver alginate under 4-layer compression I have been trying to get him to be compliant with his external compression pumps 10/21; patient with 3 small wounds on the left leg and 1 on the right medial in the setting of severe lymphedema and chronic venous insufficiency. We have been using silver alginate under 4-layer compression he is using his external compression pumps twice a day 11/4; ARTERIAL STUDIES on the right show an ABI of 1.02 TBI of 0.858 with biphasic waveforms on the left 0.98 with a TBI of 0.55 and biphasic waveforms. Does not look like he has significant arterial disease. We are treating him for  and overall I do not know exactly what he did different other than he tells me that he "has been taking his fluid pills every day instead of every other day. Obviously this is needed. I was under the understanding previously that he was taking this every day but I think he may also not been pumping every day like he was telling me but I cannot be sure this either way he does seem to be doing what he supposed to do at this point. 02-11-2023 upon evaluation today patient unfortunately appears to be about as bad today as he appeared good last week. I am not sure what happened other than the fact he did take his wrap off the right leg he tells me it got so tight that he could not feel his toes they are getting very numb. Subsequently he tells me that it was difficulty with driving therefore he took it off. With that being said I do believe that he may need to go to the ER for evaluation and treatment I feel he may need to be admitted to get some of the fluid off of his legs. I think that he is volume overloaded and at this point I do not think there is much working to be able to do to manage this outpatient we have tried pretty much everything that I am going to do. Electronic Signature(s) Signed: 02/11/2023 3:49:18 PM By: [name] PA-C Entered By: [name] on 02/11/2023 15:49:18 -------------------------------------------------------------------------------- Physical Exam Details Patient Name: Date of Service: Kilian, [name] 02/11/2023 2:00 PM Medical Record Number: [id number] Patient Account Number: [id number] Date of Birth/Sex: Treating RN: 1945/08/22 (77 y.o. M) Primary  Care Provider: [name] Other Clinician: Referring Provider: Treating Provider/Extender: [name] Weeks in Treatment: 22 Constitutional Obese and well-hydrated in no acute distress. Respiratory normal breathing without difficulty. Psychiatric this patient is able to make decisions and demonstrates good insight into disease process. Alert and Oriented x 3. pleasant and cooperative. Notes Upon inspection patient's wound bed actually showed signs of doing much worse weeping over the legs in general there is not a specific wound per se with that being said he in general just does not seem to be doing as well as replacing. he is really I am going to suggest based on what we are seeing at this point that we have the patient go to the ER for further evaluation and treatment including hopefully IV Lasix to get fluid off so that he can try to see significant improvements going forward here. Dorminey, [name] ([id number]) [id number] Page 5 of 13 Electronic Signature(s) Signed: 02/11/2023 3:49:46 PM By: [name] PA-C Entered By: [name] on 02/11/2023 15:49:46 -------------------------------------------------------------------------------- Physician Orders Details Patient Name: Date of Service: Daleo, [name] 02/11/2023 2:00 PM Medical Record Number: [id number] Patient Account Number: [id number] Date of Birth/Sex: Treating RN: Jan 11, 1946 (77 y.o. 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 [name] PA Wednesday ****extra time 60 minutes****  Left, Medial Cleanser: Soap and Water 1 x Per Week/30 Days Discharge Instructions: May shower and wash wound with dial antibacterial soap and water prior to dressing change. Peri-Wound Care: Sween Lotion (Moisturizing lotion) 1 x Per Week/30 Days Discharge Instructions: Apply moisturizing lotion as directed Prim Dressing: Aquacel Ag 1 x Per Week/30 Days ary Discharge Instructions: apply directly to wound bed. Secondary Dressing: ABD Pad, 8x10 1 x Per Week/30 Days Discharge Instructions: Apply over primary dressing as directed. Compression Wrap: Urgo K2, (equivalent to a 4 layer) two layer compression system, regular 1 x Per Week/30 Days Discharge Instructions: Apply Urgo K2 as directed (alternative to 4 layer compression). Electronic Signature(s) Signed: 02/11/2023 3:53:17 PM By: [name] PA-C Signed: 02/11/2023 4:31:50 PM By: [name] RN Entered By: [name] on 02/11/2023 15:33:43 -------------------------------------------------------------------------------- Problem List Details Patient Name: Date of Service: Mackiewicz, [name] 02/11/2023 2:00 PM Medical Record Number: [id number] Patient Account Number: [id number] Date of Birth/Sex: Treating RN: [birth year] (77 y.o. 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 06/11/2022 No Yes bilateral lower extremity Hemminger, [name] ([id number]) [id number] Page 7 of 13 I89.0 Lymphedema, not elsewhere classified 03/26/2022 No Yes L97.828  Non-pressure chronic ulcer of other part of left lower leg with other specified 03/26/2022 No Yes severity 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 M) Primary Care Provider: [name] Other Clinician: Referring Provider: Treating Provider/Extender: [name] Weeks in Treatment: 40 Subjective Chief Complaint Information obtained from Patient 02/23/2020; patient is here for wounds on his bilateral lower legs in the setting of severe lymphedema 03/26/2022; patient is here for wounds on his bilateral lower legs medial aspect History of Present Illness (HPI) ADMISSION 02/23/2020 Patient is a 77 year old man who lives in [place] who arrives accompanied by his wife. He has a history of chronic lymphedema and venous insufficiency in his bilateral lower legs which may have something to do that with having a history of DVT as well as being treated for prostate cancer. In any case he recently got compression pumps at home but compliance has been an issue here. He has compression stockings however they are probably not sufficient enough to control swelling. They tell us that things deteriorated for him in late August he was admitted to [facility] for 7 days. This was with cellulitis I think of his bilateral lower legs. Discharge he was noted to have wounds on his bilateral lower legs. He was discharged on Bactrim. They  tried to get him home health through [facility] part [facility] of course they declined him. His wife is been wrapping these applying some form of silver foam dressing. He has a history of wounds before although nothing that would not heal with basic home topical dressings. He has 2 areas on the left medial, left anterior and left lateral and a smaller area on the right medial. All of  Underdown, [name] [id number] Page 1 of 13 Visit Report for 02/11/2023 Chief Complaint Document Details Patient Name: Date of Service: Lansky, [name] 02/11/2023 2:00 PM Medical Record Number: [id number] Patient Account Number: [id number] Date of Birth/Sex: Treating RN: [birth year] (77 y.o. M) Primary Care Provider: [name] Other Clinician: Referring Provider: Treating Provider/Extender: [name] Weeks in Treatment: 51 Information Obtained from: Patient Chief Complaint 02/23/2020; patient is here for wounds on his bilateral lower legs in the setting of severe lymphedema 03/26/2022; patient is here for wounds on his bilateral lower legs medial aspect Electronic Signature(s) Signed: 02/11/2023 2:13:01 PM By: [name] PA-C Entered By: [name] on 02/11/2023 14:13:01 -------------------------------------------------------------------------------- HPI Details Patient Name: Date of Service: Rodger, [name] 02/11/2023 2:00 PM Medical Record Number: [id number] Patient Account Number: [id number] Date of Birth/Sex: Treating RN: January 29, 1946 (77 y.o. M) Primary Care Provider: [name] Other Clinician: Referring Provider: Treating Provider/Extender: [name] in Treatment: 52 History of Present Illness HPI Description: ADMISSION 02/23/2020 Patient is a 77 year old man who lives in [place] who arrives accompanied by his wife. He has a history of chronic lymphedema and venous insufficiency in his bilateral lower legs which may have something to do that with having a history of DVT as well as being treated for prostate cancer. In any case he recently got compression pumps at home but compliance has been an issue here. He has compression stockings however they are probably not sufficient enough to control swelling. They tell us that things deteriorated for him in late August he was admitted  to [facility] for 7 days. This was with cellulitis I think of his bilateral lower legs. Discharge he was noted to have wounds on his bilateral lower legs. He was discharged on Bactrim. They tried to get him home health through [facility] part C of course they declined him. His wife is been wrapping these applying some form of silver foam dressing. He has a history of wounds before although nothing that would not heal with basic home topical dressings. He has 2 areas on the left medial, left anterior and left lateral and a smaller area on the right medial. All of these have considerable depth. Past medical history includes iron deficiency anemia, lymphedema followed by the rehab center at [facility] with lymphedema wraps I believe, DVT on chronic anticoagulation, prostate cancer, chronic venous insufficiency, hypertension. As mentioned he has compression pumps but does not use them. ABIs in our clinic were noncompressible bilaterally 10/14; patient with severe bilateral lymphedema right greater than left. He came in with bilateral lower extremity wounds left greater than right. Even though the right side has more of the edema most of the wounds here almost closed on the right medial. He has 3 remaining wounds on the left We have been using silver alginate under 4-layer compression I have been trying to get him to be compliant with his external compression pumps 10/21; patient with 3 small wounds on the left leg and 1 on the right medial in the setting of severe lymphedema and chronic venous insufficiency. We have been using silver alginate under 4-layer compression he is using his external compression pumps twice a day 11/4; ARTERIAL STUDIES on the right show an ABI of 1.02 TBI of 0.858 with biphasic waveforms on the left 0.98 with a TBI of 0.55 and biphasic waveforms. Does not look like he has significant arterial disease. We are treating him for  Left, Medial Cleanser: Soap and Water 1 x Per Week/30 Days Discharge Instructions: May shower and wash wound with dial antibacterial soap and water prior to dressing change. Peri-Wound Care: Sween Lotion (Moisturizing lotion) 1 x Per Week/30 Days Discharge Instructions: Apply moisturizing lotion as directed Prim Dressing: Aquacel Ag 1 x Per Week/30 Days ary Discharge Instructions: apply directly to wound bed. Secondary Dressing: ABD Pad, 8x10 1 x Per Week/30 Days Discharge Instructions: Apply over primary dressing as directed. Compression Wrap: Urgo K2, (equivalent to a 4 layer) two layer compression system, regular 1 x Per Week/30 Days Discharge Instructions: Apply Urgo K2 as directed (alternative to 4 layer compression). Electronic Signature(s) Signed: 02/11/2023 3:53:17 PM By: [name] PA-C Signed: 02/11/2023 4:31:50 PM By: [name] RN Entered By: [name] on 02/11/2023 15:33:43 -------------------------------------------------------------------------------- Problem List Details Patient Name: Date of Service: Mackiewicz, [name] 02/11/2023 2:00 PM Medical Record Number: [id number] Patient Account Number: [id number] Date of Birth/Sex: Treating RN: [birth year] (77 y.o. 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 06/11/2022 No Yes bilateral lower extremity Hemminger, [name] ([id number]) [id number] Page 7 of 13 I89.0 Lymphedema, not elsewhere classified 03/26/2022 No Yes L97.828  Non-pressure chronic ulcer of other part of left lower leg with other specified 03/26/2022 No Yes severity 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 M) Primary Care Provider: [name] Other Clinician: Referring Provider: Treating Provider/Extender: [name] Weeks in Treatment: 40 Subjective Chief Complaint Information obtained from Patient 02/23/2020; patient is here for wounds on his bilateral lower legs in the setting of severe lymphedema 03/26/2022; patient is here for wounds on his bilateral lower legs medial aspect History of Present Illness (HPI) ADMISSION 02/23/2020 Patient is a 77 year old man who lives in [place] who arrives accompanied by his wife. He has a history of chronic lymphedema and venous insufficiency in his bilateral lower legs which may have something to do that with having a history of DVT as well as being treated for prostate cancer. In any case he recently got compression pumps at home but compliance has been an issue here. He has compression stockings however they are probably not sufficient enough to control swelling. They tell us that things deteriorated for him in late August he was admitted to [facility] for 7 days. This was with cellulitis I think of his bilateral lower legs. Discharge he was noted to have wounds on his bilateral lower legs. He was discharged on Bactrim. They  tried to get him home health through [facility] part C of course they declined him. His wife is been wrapping these applying some form of silver foam dressing. He has a history of wounds before although nothing that would not heal with basic home topical dressings. He has 2 areas on the left medial, left anterior and left lateral and a smaller area on the right medial. All of  like that he is volume/fluid overloaded and needs more aggressive removal of this excess fluid to see any type of improvement. We will see patient back for reevaluation in 1 week here in the clinic. If anything worsens or changes patient will contact our office for additional recommendations. Electronic Signature(s) Signed: 02/11/2023 3:51:48 PM By: [name] PA-C Entered By: [name] on 02/11/2023 15:51:47 -------------------------------------------------------------------------------- SuperBill Details Patient Name: Date of Service: Wysong, [name] 02/11/2023 Medical Record Number: [id number] Patient Account Number: [id number] Date of Birth/Sex: Treating RN: November 17, 1945 (77 y.o. 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 I89.0 Lymphedema, not elsewhere classified  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 of other part of right lower leg with other specified severity Quantity: 1 Electronic Signature(s) Signed: 02/17/2023 12:36:04 PM By: Pearletha Alfred Signed: 02/17/2023 2:40:07 PM By: [name] PA-C Previous Signature: 02/11/2023 3:51:59 PM Version By: [name] PA-C Entered By: Pearletha Alfred on 02/17/2023 12:36:04  and overall I do not know exactly what he did different other than he tells me that he "has been taking his fluid pills every day instead of every other day. Obviously this is needed. I was under the understanding previously that he was taking this every day but I think he may also not been pumping every day like he was telling me but I cannot be sure this either way he does seem to be doing what he supposed to do at this point. 02-11-2023 upon evaluation today patient unfortunately appears to be about as bad today as he appeared good last week. I am not sure what happened other than the fact he did take his wrap off the right leg he tells me it got so tight that he could not feel his toes they are getting very numb. Subsequently he tells me that it was difficulty with driving therefore he took it off. With that being said I do believe that he may need to go to the ER for evaluation and treatment I feel he may need to be admitted to get some of the fluid off of his legs. I think that he is volume overloaded and at this point I do not think there is much working to be able to do to manage this outpatient we have tried pretty much everything that I am going to do. Electronic Signature(s) Signed: 02/11/2023 3:49:18 PM By: [name] PA-C Entered By: [name] on 02/11/2023 15:49:18 -------------------------------------------------------------------------------- Physical Exam Details Patient Name: Date of Service: Kilian, [name] 02/11/2023 2:00 PM Medical Record Number: [id number] Patient Account Number: [id number] Date of Birth/Sex: Treating RN: 1945/08/22 (77 y.o. M) Primary  Care Provider: [name] Other Clinician: Referring Provider: Treating Provider/Extender: [name] Weeks in Treatment: 22 Constitutional Obese and well-hydrated in no acute distress. Respiratory normal breathing without difficulty. Psychiatric this patient is able to make decisions and demonstrates good insight into disease process. Alert and Oriented x 3. pleasant and cooperative. Notes Upon inspection patient's wound bed actually showed signs of doing much worse weeping over the legs in general there is not a specific wound per se with that being said he in general just does not seem to be doing as well as replacing. he is really I am going to suggest based on what we are seeing at this point that we have the patient go to the ER for further evaluation and treatment including hopefully IV Lasix to get fluid off so that he can try to see significant improvements going forward here. Dorminey, [name] ([id number]) [id number] Page 5 of 13 Electronic Signature(s) Signed: 02/11/2023 3:49:46 PM By: [name] PA-C Entered By: [name] on 02/11/2023 15:49:46 -------------------------------------------------------------------------------- Physician Orders Details Patient Name: Date of Service: Daleo, [name] 02/11/2023 2:00 PM Medical Record Number: [id number] Patient Account Number: [id number] Date of Birth/Sex: Treating RN: Jan 11, 1946 (77 y.o. 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 [name] PA Wednesday ****extra time 60 minutes****  like that he is volume/fluid overloaded and needs more aggressive removal of this excess fluid to see any type of improvement. We will see patient back for reevaluation in 1 week here in the clinic. If anything worsens or changes patient will contact our office for additional recommendations. Electronic Signature(s) Signed: 02/11/2023 3:51:48 PM By: [name] PA-C Entered By: [name] on 02/11/2023 15:51:47 -------------------------------------------------------------------------------- SuperBill Details Patient Name: Date of Service: Wysong, [name] 02/11/2023 Medical Record Number: [id number] Patient Account Number: [id number] Date of Birth/Sex: Treating RN: November 17, 1945 (77 y.o. 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 I89.0 Lymphedema, not elsewhere classified  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.818 Non-pressure chronic ulcer of other part of right lower leg with other specified severity Quantity: 1 Electronic Signature(s) Signed: 02/17/2023 12:36:04 PM By: Pearletha Alfred Signed: 02/17/2023 2:40:07 PM By: [name] PA-C Previous Signature: 02/11/2023 3:51:59 PM Version By: [name] PA-C Entered By: Pearletha Alfred on 02/17/2023 12:36:04  Underdown, [name] [id number] Page 1 of 13 Visit Report for 02/11/2023 Chief Complaint Document Details Patient Name: Date of Service: Lansky, [name] 02/11/2023 2:00 PM Medical Record Number: [id number] Patient Account Number: [id number] Date of Birth/Sex: Treating RN: [birth year] (77 y.o. M) Primary Care Provider: [name] Other Clinician: Referring Provider: Treating Provider/Extender: [name] Weeks in Treatment: 51 Information Obtained from: Patient Chief Complaint 02/23/2020; patient is here for wounds on his bilateral lower legs in the setting of severe lymphedema 03/26/2022; patient is here for wounds on his bilateral lower legs medial aspect Electronic Signature(s) Signed: 02/11/2023 2:13:01 PM By: [name] PA-C Entered By: [name] on 02/11/2023 14:13:01 -------------------------------------------------------------------------------- HPI Details Patient Name: Date of Service: Rodger, [name] 02/11/2023 2:00 PM Medical Record Number: [id number] Patient Account Number: [id number] Date of Birth/Sex: Treating RN: January 29, 1946 (77 y.o. M) Primary Care Provider: [name] Other Clinician: Referring Provider: Treating Provider/Extender: [name] in Treatment: 52 History of Present Illness HPI Description: ADMISSION 02/23/2020 Patient is a 77 year old man who lives in [place] who arrives accompanied by his wife. He has a history of chronic lymphedema and venous insufficiency in his bilateral lower legs which may have something to do that with having a history of DVT as well as being treated for prostate cancer. In any case he recently got compression pumps at home but compliance has been an issue here. He has compression stockings however they are probably not sufficient enough to control swelling. They tell us that things deteriorated for him in late August he was admitted  to [facility] for 7 days. This was with cellulitis I think of his bilateral lower legs. Discharge he was noted to have wounds on his bilateral lower legs. He was discharged on Bactrim. They tried to get him home health through [facility] of course they declined him. His wife is been wrapping these applying some form of silver foam dressing. He has a history of wounds before although nothing that would not heal with basic home topical dressings. He has 2 areas on the left medial, left anterior and left lateral and a smaller area on the right medial. All of these have considerable depth. Past medical history includes iron deficiency anemia, lymphedema followed by the rehab center at [facility] with lymphedema wraps I believe, DVT on chronic anticoagulation, prostate cancer, chronic venous insufficiency, hypertension. As mentioned he has compression pumps but does not use them. ABIs in our clinic were noncompressible bilaterally 10/14; patient with severe bilateral lymphedema right greater than left. He came in with bilateral lower extremity wounds left greater than right. Even though the right side has more of the edema most of the wounds here almost closed on the right medial. He has 3 remaining wounds on the left We have been using silver alginate under 4-layer compression I have been trying to get him to be compliant with his external compression pumps 10/21; patient with 3 small wounds on the left leg and 1 on the right medial in the setting of severe lymphedema and chronic venous insufficiency. We have been using silver alginate under 4-layer compression he is using his external compression pumps twice a day 11/4; ARTERIAL STUDIES on the right show an ABI of 1.02 TBI of 0.858 with biphasic waveforms on the left 0.98 with a TBI of 0.55 and biphasic waveforms. Does not look like he has significant arterial disease. We are treating him for  and overall I do not know exactly what he did different other than he tells me that he "has been taking his fluid pills every day instead of every other day. Obviously this is needed. I was under the understanding previously that he was taking this every day but I think he may also not been pumping every day like he was telling me but I cannot be sure this either way he does seem to be doing what he supposed to do at this point. 02-11-2023 upon evaluation today patient unfortunately appears to be about as bad today as he appeared good last week. I am not sure what happened other than the fact he did take his wrap off the right leg he tells me it got so tight that he could not feel his toes they are getting very numb. Subsequently he tells me that it was difficulty with driving therefore he took it off. With that being said I do believe that he may need to go to the ER for evaluation and treatment I feel he may need to be admitted to get some of the fluid off of his legs. I think that he is volume overloaded and at this point I do not think there is much working to be able to do to manage this outpatient we have tried pretty much everything that I am going to do. Electronic Signature(s) Signed: 02/11/2023 3:49:18 PM By: [name] PA-C Entered By: [name] on 02/11/2023 15:49:18 -------------------------------------------------------------------------------- Physical Exam Details Patient Name: Date of Service: Kilian, [name] 02/11/2023 2:00 PM Medical Record Number: [id number] Patient Account Number: [id number] Date of Birth/Sex: Treating RN: 1945/08/22 (77 y.o. M) Primary  Care Provider: [name] Other Clinician: Referring Provider: Treating Provider/Extender: [name] Weeks in Treatment: 22 Constitutional Obese and well-hydrated in no acute distress. Respiratory normal breathing without difficulty. Psychiatric this patient is able to make decisions and demonstrates good insight into disease process. Alert and Oriented x 3. pleasant and cooperative. Notes Upon inspection patient's wound bed actually showed signs of doing much worse weeping over the legs in general there is not a specific wound per se with that being said he in general just does not seem to be doing as well as replacing. he is really I am going to suggest based on what we are seeing at this point that we have the patient go to the ER for further evaluation and treatment including hopefully IV Lasix to get fluid off so that he can try to see significant improvements going forward here. Dorminey, [name] ([id number]) [id number] Page 5 of 13 Electronic Signature(s) Signed: 02/11/2023 3:49:46 PM By: [name] PA-C Entered By: [name] on 02/11/2023 15:49:46 -------------------------------------------------------------------------------- Physician Orders Details Patient Name: Date of Service: Daleo, [name] 02/11/2023 2:00 PM Medical Record Number: [id number] Patient Account Number: [id number] Date of Birth/Sex: Treating RN: Jan 11, 1946 (77 y.o. 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 [name] PA Wednesday ****extra time 60 minutes****  like that he is volume/fluid overloaded and needs more aggressive removal of this excess fluid to see any type of improvement. We will see patient back for reevaluation in 1 week here in the clinic. If anything worsens or changes patient will contact our office for additional recommendations. Electronic Signature(s) Signed: 02/11/2023 3:51:48 PM By: [name] PA-C Entered By: [name] on 02/11/2023 15:51:47 -------------------------------------------------------------------------------- SuperBill Details Patient Name: Date of Service: Wysong, [name] 02/11/2023 Medical Record Number: [id number] Patient Account Number: [id number] Date of Birth/Sex: Treating RN: November 17, 1945 (77 y.o. 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 I89.0 Lymphedema, not elsewhere classified  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.818 Non-pressure chronic ulcer of other part of right lower leg with other specified severity Quantity: 1 Electronic Signature(s) Signed: 02/17/2023 12:36:04 PM By: Pearletha Alfred Signed: 02/17/2023 2:40:07 PM By: [name] PA-C Previous Signature: 02/11/2023 3:51:59 PM Version By: [name] PA-C Entered By: Pearletha Alfred on 02/17/2023 12:36:04  Left, Medial Cleanser: Soap and Water 1 x Per Week/30 Days Discharge Instructions: May shower and wash wound with dial antibacterial soap and water prior to dressing change. Peri-Wound Care: Sween Lotion (Moisturizing lotion) 1 x Per Week/30 Days Discharge Instructions: Apply moisturizing lotion as directed Prim Dressing: Aquacel Ag 1 x Per Week/30 Days ary Discharge Instructions: apply directly to wound bed. Secondary Dressing: ABD Pad, 8x10 1 x Per Week/30 Days Discharge Instructions: Apply over primary dressing as directed. Compression Wrap: Urgo K2, (equivalent to a 4 layer) two layer compression system, regular 1 x Per Week/30 Days Discharge Instructions: Apply Urgo K2 as directed (alternative to 4 layer compression). Electronic Signature(s) Signed: 02/11/2023 3:53:17 PM By: [name] PA-C Signed: 02/11/2023 4:31:50 PM By: [name] RN Entered By: [name] on 02/11/2023 15:33:43 -------------------------------------------------------------------------------- Problem List Details Patient Name: Date of Service: Mackiewicz, [name] 02/11/2023 2:00 PM Medical Record Number: [id number] Patient Account Number: [id number] Date of Birth/Sex: Treating RN: [birth year] (77 y.o. 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 06/11/2022 No Yes bilateral lower extremity Hemminger, [name] ([id number]) [id number] Page 7 of 13 I89.0 Lymphedema, not elsewhere classified 03/26/2022 No Yes L97.828  Non-pressure chronic ulcer of other part of left lower leg with other specified 03/26/2022 No Yes severity 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 M) Primary Care Provider: [name] Other Clinician: Referring Provider: Treating Provider/Extender: [name] Weeks in Treatment: 40 Subjective Chief Complaint Information obtained from Patient 02/23/2020; patient is here for wounds on his bilateral lower legs in the setting of severe lymphedema 03/26/2022; patient is here for wounds on his bilateral lower legs medial aspect History of Present Illness (HPI) ADMISSION 02/23/2020 Patient is a 77 year old man who lives in [place] who arrives accompanied by his wife. He has a history of chronic lymphedema and venous insufficiency in his bilateral lower legs which may have something to do that with having a history of DVT as well as being treated for prostate cancer. In any case he recently got compression pumps at home but compliance has been an issue here. He has compression stockings however they are probably not sufficient enough to control swelling. They tell us that things deteriorated for him in late August he was admitted to [facility] for 7 days. This was with cellulitis I think of his bilateral lower legs. Discharge he was noted to have wounds on his bilateral lower legs. He was discharged on Bactrim. They  tried to get him home health through [facility] part C of course they declined him. His wife is been wrapping these applying some form of silver foam dressing. He has a history of wounds before although nothing that would not heal with basic home topical dressings. He has 2 areas on the left medial, left anterior and left lateral and a smaller area on the right medial. All of  like that he is volume/fluid overloaded and needs more aggressive removal of this excess fluid to see any type of improvement. We will see patient back for reevaluation in 1 week here in the clinic. If anything worsens or changes patient will contact our office for additional recommendations. Electronic Signature(s) Signed: 02/11/2023 3:51:48 PM By: [name] PA-C Entered By: [name] on 02/11/2023 15:51:47 -------------------------------------------------------------------------------- SuperBill Details Patient Name: Date of Service: Wysong, [name] 02/11/2023 Medical Record Number: [id number] Patient Account Number: [id number] Date of Birth/Sex: Treating RN: November 17, 1945 (77 y.o. 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 I89.0 Lymphedema, not elsewhere classified  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.818 Non-pressure chronic ulcer of other part of right lower leg with other specified severity Quantity: 1 Electronic Signature(s) Signed: 02/17/2023 12:36:04 PM By: Pearletha Alfred Signed: 02/17/2023 2:40:07 PM By: [name] PA-C Previous Signature: 02/11/2023 3:51:59 PM Version By: [name] PA-C Entered By: Pearletha Alfred on 02/17/2023 12:36:04  Underdown, [name] [id number] Page 1 of 13 Visit Report for 02/11/2023 Chief Complaint Document Details Patient Name: Date of Service: Lansky, [name] 02/11/2023 2:00 PM Medical Record Number: [id number] Patient Account Number: [id number] Date of Birth/Sex: Treating RN: [birth year] (77 y.o. M) Primary Care Provider: [name] Other Clinician: Referring Provider: Treating Provider/Extender: [name] Weeks in Treatment: 51 Information Obtained from: Patient Chief Complaint 02/23/2020; patient is here for wounds on his bilateral lower legs in the setting of severe lymphedema 03/26/2022; patient is here for wounds on his bilateral lower legs medial aspect Electronic Signature(s) Signed: 02/11/2023 2:13:01 PM By: [name] PA-C Entered By: [name] on 02/11/2023 14:13:01 -------------------------------------------------------------------------------- HPI Details Patient Name: Date of Service: Rodger, [name] 02/11/2023 2:00 PM Medical Record Number: [id number] Patient Account Number: [id number] Date of Birth/Sex: Treating RN: January 29, 1946 (77 y.o. M) Primary Care Provider: [name] Other Clinician: Referring Provider: Treating Provider/Extender: [name] in Treatment: 52 History of Present Illness HPI Description: ADMISSION 02/23/2020 Patient is a 77 year old man who lives in [place] who arrives accompanied by his wife. He has a history of chronic lymphedema and venous insufficiency in his bilateral lower legs which may have something to do that with having a history of DVT as well as being treated for prostate cancer. In any case he recently got compression pumps at home but compliance has been an issue here. He has compression stockings however they are probably not sufficient enough to control swelling. They tell us that things deteriorated for him in late August he was admitted  to [facility] for 7 days. This was with cellulitis I think of his bilateral lower legs. Discharge he was noted to have wounds on his bilateral lower legs. He was discharged on Bactrim. They tried to get him home health through [facility] of course they declined him. His wife is been wrapping these applying some form of silver foam dressing. He has a history of wounds before although nothing that would not heal with basic home topical dressings. He has 2 areas on the left medial, left anterior and left lateral and a smaller area on the right medial. All of these have considerable depth. Past medical history includes iron deficiency anemia, lymphedema followed by the rehab center at [facility] with lymphedema wraps I believe, DVT on chronic anticoagulation, prostate cancer, chronic venous insufficiency, hypertension. As mentioned he has compression pumps but does not use them. ABIs in our clinic were noncompressible bilaterally 10/14; patient with severe bilateral lymphedema right greater than left. He came in with bilateral lower extremity wounds left greater than right. Even though the right side has more of the edema most of the wounds here almost closed on the right medial. He has 3 remaining wounds on the left We have been using silver alginate under 4-layer compression I have been trying to get him to be compliant with his external compression pumps 10/21; patient with 3 small wounds on the left leg and 1 on the right medial in the setting of severe lymphedema and chronic venous insufficiency. We have been using silver alginate under 4-layer compression he is using his external compression pumps twice a day 11/4; ARTERIAL STUDIES on the right show an ABI of 1.02 TBI of 0.858 with biphasic waveforms on the left 0.98 with a TBI of 0.55 and biphasic waveforms. Does not look like he has significant arterial disease. We are treating him for  like that he is volume/fluid overloaded and needs more aggressive removal of this excess fluid to see any type of improvement. We will see patient back for reevaluation in 1 week here in the clinic. If anything worsens or changes patient will contact our office for additional recommendations. Electronic Signature(s) Signed: 02/11/2023 3:51:48 PM By: [name] PA-C Entered By: [name] on 02/11/2023 15:51:47 -------------------------------------------------------------------------------- SuperBill Details Patient Name: Date of Service: Wysong, [name] 02/11/2023 Medical Record Number: [id number] Patient Account Number: [id number] Date of Birth/Sex: Treating RN: November 17, 1945 (77 y.o. 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 I89.0 Lymphedema, not elsewhere classified  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.818 Non-pressure chronic ulcer of other part of right lower leg with other specified severity Quantity: 1 Electronic Signature(s) Signed: 02/17/2023 12:36:04 PM By: Pearletha Alfred Signed: 02/17/2023 2:40:07 PM By: [name] PA-C Previous Signature: 02/11/2023 3:51:59 PM Version By: [name] PA-C Entered By: Pearletha Alfred on 02/17/2023 12:36:04  like that he is volume/fluid overloaded and needs more aggressive removal of this excess fluid to see any type of improvement. We will see patient back for reevaluation in 1 week here in the clinic. If anything worsens or changes patient will contact our office for additional recommendations. Electronic Signature(s) Signed: 02/11/2023 3:51:48 PM By: [name] PA-C Entered By: [name] on 02/11/2023 15:51:47 -------------------------------------------------------------------------------- SuperBill Details Patient Name: Date of Service: Wysong, [name] 02/11/2023 Medical Record Number: [id number] Patient Account Number: [id number] Date of Birth/Sex: Treating RN: November 17, 1945 (77 y.o. 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 I89.0 Lymphedema, not elsewhere classified  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.818 Non-pressure chronic ulcer of other part of right lower leg with other specified severity Quantity: 1 Electronic Signature(s) Signed: 02/17/2023 12:36:04 PM By: Pearletha Alfred Signed: 02/17/2023 2:40:07 PM By: [name] PA-C Previous Signature: 02/11/2023 3:51:59 PM Version By: [name] PA-C Entered By: Pearletha Alfred on 02/17/2023 12:36:04  Left, Medial Cleanser: Soap and Water 1 x Per Week/30 Days Discharge Instructions: May shower and wash wound with dial antibacterial soap and water prior to dressing change. Peri-Wound Care: Sween Lotion (Moisturizing lotion) 1 x Per Week/30 Days Discharge Instructions: Apply moisturizing lotion as directed Prim Dressing: Aquacel Ag 1 x Per Week/30 Days ary Discharge Instructions: apply directly to wound bed. Secondary Dressing: ABD Pad, 8x10 1 x Per Week/30 Days Discharge Instructions: Apply over primary dressing as directed. Compression Wrap: Urgo K2, (equivalent to a 4 layer) two layer compression system, regular 1 x Per Week/30 Days Discharge Instructions: Apply Urgo K2 as directed (alternative to 4 layer compression). Electronic Signature(s) Signed: 02/11/2023 3:53:17 PM By: [name] PA-C Signed: 02/11/2023 4:31:50 PM By: [name] RN Entered By: [name] on 02/11/2023 15:33:43 -------------------------------------------------------------------------------- Problem List Details Patient Name: Date of Service: Mackiewicz, [name] 02/11/2023 2:00 PM Medical Record Number: [id number] Patient Account Number: [id number] Date of Birth/Sex: Treating RN: [birth year] (77 y.o. 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 06/11/2022 No Yes bilateral lower extremity Hemminger, [name] ([id number]) [id number] Page 7 of 13 I89.0 Lymphedema, not elsewhere classified 03/26/2022 No Yes L97.828  Non-pressure chronic ulcer of other part of left lower leg with other specified 03/26/2022 No Yes severity 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 M) Primary Care Provider: [name] Other Clinician: Referring Provider: Treating Provider/Extender: [name] Weeks in Treatment: 40 Subjective Chief Complaint Information obtained from Patient 02/23/2020; patient is here for wounds on his bilateral lower legs in the setting of severe lymphedema 03/26/2022; patient is here for wounds on his bilateral lower legs medial aspect History of Present Illness (HPI) ADMISSION 02/23/2020 Patient is a 77 year old man who lives in [place] who arrives accompanied by his wife. He has a history of chronic lymphedema and venous insufficiency in his bilateral lower legs which may have something to do that with having a history of DVT as well as being treated for prostate cancer. In any case he recently got compression pumps at home but compliance has been an issue here. He has compression stockings however they are probably not sufficient enough to control swelling. They tell us that things deteriorated for him in late August he was admitted to [facility] for 7 days. This was with cellulitis I think of his bilateral lower legs. Discharge he was noted to have wounds on his bilateral lower legs. He was discharged on Bactrim. They  tried to get him home health through [facility] of course they declined him. His wife is been wrapping these applying some form of silver foam dressing. He has a history of wounds before although nothing that would not heal with basic home topical dressings. He has 2 areas on the left medial, left anterior and left lateral and a smaller area on the right medial. All of  Left, Medial Cleanser: Soap and Water 1 x Per Week/30 Days Discharge Instructions: May shower and wash wound with dial antibacterial soap and water prior to dressing change. Peri-Wound Care: Sween Lotion (Moisturizing lotion) 1 x Per Week/30 Days Discharge Instructions: Apply moisturizing lotion as directed Prim Dressing: Aquacel Ag 1 x Per Week/30 Days ary Discharge Instructions: apply directly to wound bed. Secondary Dressing: ABD Pad, 8x10 1 x Per Week/30 Days Discharge Instructions: Apply over primary dressing as directed. Compression Wrap: Urgo K2, (equivalent to a 4 layer) two layer compression system, regular 1 x Per Week/30 Days Discharge Instructions: Apply Urgo K2 as directed (alternative to 4 layer compression). Electronic Signature(s) Signed: 02/11/2023 3:53:17 PM By: [name] PA-C Signed: 02/11/2023 4:31:50 PM By: [name] RN Entered By: [name] on 02/11/2023 15:33:43 -------------------------------------------------------------------------------- Problem List Details Patient Name: Date of Service: Mackiewicz, [name] 02/11/2023 2:00 PM Medical Record Number: [id number] Patient Account Number: [id number] Date of Birth/Sex: Treating RN: [birth year] (77 y.o. 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 06/11/2022 No Yes bilateral lower extremity Hemminger, [name] ([id number]) [id number] Page 7 of 13 I89.0 Lymphedema, not elsewhere classified 03/26/2022 No Yes L97.828  Non-pressure chronic ulcer of other part of left lower leg with other specified 03/26/2022 No Yes severity 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 M) Primary Care Provider: [name] Other Clinician: Referring Provider: Treating Provider/Extender: [name] Weeks in Treatment: 40 Subjective Chief Complaint Information obtained from Patient 02/23/2020; patient is here for wounds on his bilateral lower legs in the setting of severe lymphedema 03/26/2022; patient is here for wounds on his bilateral lower legs medial aspect History of Present Illness (HPI) ADMISSION 02/23/2020 Patient is a 77 year old man who lives in [place] who arrives accompanied by his wife. He has a history of chronic lymphedema and venous insufficiency in his bilateral lower legs which may have something to do that with having a history of DVT as well as being treated for prostate cancer. In any case he recently got compression pumps at home but compliance has been an issue here. He has compression stockings however they are probably not sufficient enough to control swelling. They tell us that things deteriorated for him in late August he was admitted to [facility] for 7 days. This was with cellulitis I think of his bilateral lower legs. Discharge he was noted to have wounds on his bilateral lower legs. He was discharged on Bactrim. They  tried to get him home health through [facility] part [facility] of course they declined him. His wife is been wrapping these applying some form of silver foam dressing. He has a history of wounds before although nothing that would not heal with basic home topical dressings. He has 2 areas on the left medial, left anterior and left lateral and a smaller area on the right medial. All of

## 2023-02-18 ENCOUNTER — Encounter (HOSPITAL_BASED_OUTPATIENT_CLINIC_OR_DEPARTMENT_OTHER): Payer: Medicare Other | Attending: Physician Assistant | Admitting: Physician Assistant

## 2023-02-18 DIAGNOSIS — L97828 Non-pressure chronic ulcer of other part of left lower leg with other specified severity: Secondary | ICD-10-CM | POA: Insufficient documentation

## 2023-02-18 DIAGNOSIS — I130 Hypertensive heart and chronic kidney disease with heart failure and stage 1 through stage 4 chronic kidney disease, or unspecified chronic kidney disease: Secondary | ICD-10-CM | POA: Insufficient documentation

## 2023-02-18 DIAGNOSIS — L84 Corns and callosities: Secondary | ICD-10-CM | POA: Diagnosis not present

## 2023-02-18 DIAGNOSIS — Z86718 Personal history of other venous thrombosis and embolism: Secondary | ICD-10-CM | POA: Diagnosis not present

## 2023-02-18 DIAGNOSIS — I5032 Chronic diastolic (congestive) heart failure: Secondary | ICD-10-CM | POA: Diagnosis not present

## 2023-02-18 DIAGNOSIS — I890 Lymphedema, not elsewhere classified: Secondary | ICD-10-CM | POA: Diagnosis not present

## 2023-02-18 DIAGNOSIS — I87333 Chronic venous hypertension (idiopathic) with ulcer and inflammation of bilateral lower extremity: Secondary | ICD-10-CM | POA: Diagnosis present

## 2023-02-18 DIAGNOSIS — N1832 Chronic kidney disease, stage 3b: Secondary | ICD-10-CM | POA: Diagnosis not present

## 2023-02-18 DIAGNOSIS — L97818 Non-pressure chronic ulcer of other part of right lower leg with other specified severity: Secondary | ICD-10-CM | POA: Diagnosis not present

## 2023-02-18 DIAGNOSIS — Z8546 Personal history of malignant neoplasm of prostate: Secondary | ICD-10-CM | POA: Diagnosis not present

## 2023-02-18 NOTE — Progress Notes (Signed)
apply directly to wound bed. Secondary Dressing: ABD Pad, 8x10 1 x Per Week/30 Days Discharge Instructions: Apply over primary dressing as directed. Secondary Dressing: OptiLock Super Absorbent, 5x5.5 (in/in) (Generic) 1 x Per Week/30 Days Discharge Instructions: Apply directly to wound bed as directed Compression Wrap: Urgo K2, (equivalent to a 4 layer) two layer compression system, regular 1 x Per Week/30 Days Discharge Instructions: Apply Urgo K2 as directed (alternative to 4 layer compression). Electronic Signature(s) Unsigned Entered By: [name] on 02/18/2023 12:32:40 -------------------------------------------------------------------------------- Problem List Details Patient Name: Date of Service: [name], [name] 02/18/2023 2:00 PM Medical Record Number: [id number] Patient Account Number: [id number] Date of Birth/Sex: Treating RN: Jun 10, 1945 (77 y.o. 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 of 06/11/2022 No Yes bilateral lower extremity I89.0 Lymphedema, not elsewhere classified 03/26/2022 No Yes Hajjar, Yul ([id number]) [id number] Page 4 of 4 [phone] Non-pressure chronic ulcer of other part of left lower leg with other specified 03/26/2022 No Yes severity L97.818 Non-pressure chronic ulcer of other part of right lower leg with other specified 03/26/2022 No  Yes severity L84 Corns and callosities 10/15/2022 No Yes Inactive Problems Resolved Problems Electronic Signature(s) Signed: 02/18/2023 2:38:00 PM By: [name] PA-C Entered By: [name] on 02/18/2023 11:38:00 -------------------------------------------------------------------------------- SuperBill Details Patient Name: Date of Service: [name], [name] 02/18/2023 Medical Record Number: [id number] Patient Account Number: [id number] Date of Birth/Sex: Treating RN: 1945-11-22 (77 y.o. 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 Modifier: Quantity: 1 Electronic Signature(s) Unsigned Entered By: [name] on 02/18/2023 12:33:01 Signature(s): Date(s):  Mcqueary, [name] ([id number]) [id number] Page 1 of 4 Visit Report for 02/18/2023 Chief Complaint Document Details Patient Name: Date of Service: [name], [name] 02/18/2023 2:00 PM Medical Record Number: [id number] Patient Account Number: [id number] Date of Birth/Sex: Treating RN: 21-Sep-1945 (77 y.o. M) Primary Care Provider: [name] Other Clinician: Referring Provider: Treating Provider/Extender: [name] Weeks in Treatment: 48 Information Obtained from: Patient Chief Complaint 02/23/2020; patient is here for wounds on his bilateral lower legs in the setting of severe lymphedema 03/26/2022; patient is here for wounds on his bilateral lower legs medial aspect Electronic Signature(s) Signed: 02/18/2023 2:41:20 PM By: [name] PA-C Entered By: [name] on 02/18/2023 11:41:20 -------------------------------------------------------------------------------- Physician Orders Details Patient Name: Date of Service: [name], [name] 02/18/2023 2:00 PM Medical Record Number: [id number] Patient Account Number: [id number] Date of Birth/Sex: Treating RN: 02/28/1946 (77 y.o. 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 [name] PA Wednesday ****extra time 60 minutes**** 02/25/2023 315pm Return A Provider recommends you go to the Hospital as soon as possible- related to excessive fluid overload. ppointment in 2 weeks. [name] PA (already scheduled) 03/04/2023 3pm Return A Return appointment in 3 weeks. - Please see front desk to schedule appointment Return appointment in 1 month. - Please see front desk to schedule appointment Anesthetic (In clinic) Topical Lidocaine 5% applied to wound bed Bathing/ Shower/ Hygiene May shower with protection but do not get wound dressing(s) wet. Protect dressing(s) with water repellant cover (for example, large plastic bag) or a cast cover and may then take shower. Attridge, [name] ([id number]) [id number] Page 2 of 4 Edema Control - Lymphedema / SCD / Other Lymphedema Pumps. Use Lymphedema pumps on leg(s) 2-3 times a day for 45-60 minutes. If wearing any wraps or hose, do not remove them. Continue exercising as instructed. - *****pump 3 times a day for an hour each time.***** Elevate legs to the level of the heart or above for 30 minutes daily and/or when sitting for 3-4 times a day throughout the day. - +++ Important+++ ELEVATE THE LEGS>>>>>>>>>> Avoid standing for long periods of time. Exercise regularly Compression stocking or Garment 30-40 mm/Hg pressure to: - use juxtafits to both legs daily if compression wraps removed Other Edema Control Orders/Instructions: - When sitting please keep your feet up, keep legs at Heart Level or above. Use pillows behind calf to for comfort. Off-Loading Other: - When sitting please keep your feet up, keep legs at Heart Level or above. Use pillows behind calf to for comfort. Additional Orders / Instructions Follow Nutritious Diet - Try and increase protein intake. Goal of protein intake is 60g-100g. Wound Treatment Wound #12 - Lower Leg Wound Laterality: Left, Anterior Cleanser: Soap and Water 1 x Per  Week/30 Days Discharge Instructions: May shower and wash wound with dial antibacterial soap and water prior to dressing change. Peri-Wound Care: Sween Lotion (Moisturizing  Mcqueary, [name] ([id number]) [id number] Page 1 of 4 Visit Report for 02/18/2023 Chief Complaint Document Details Patient Name: Date of Service: [name], [name] 02/18/2023 2:00 PM Medical Record Number: [id number] Patient Account Number: [id number] Date of Birth/Sex: Treating RN: 21-Sep-1945 (77 y.o. M) Primary Care Provider: [name] Other Clinician: Referring Provider: Treating Provider/Extender: [name] Weeks in Treatment: 48 Information Obtained from: Patient Chief Complaint 02/23/2020; patient is here for wounds on his bilateral lower legs in the setting of severe lymphedema 03/26/2022; patient is here for wounds on his bilateral lower legs medial aspect Electronic Signature(s) Signed: 02/18/2023 2:41:20 PM By: [name] PA-C Entered By: [name] on 02/18/2023 11:41:20 -------------------------------------------------------------------------------- Physician Orders Details Patient Name: Date of Service: [name], [name] 02/18/2023 2:00 PM Medical Record Number: [id number] Patient Account Number: [id number] Date of Birth/Sex: Treating RN: 02/28/1946 (77 y.o. 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 [name] PA Wednesday ****extra time 60 minutes**** 02/25/2023 315pm Return A Provider recommends you go to the Hospital as soon as possible- related to excessive fluid overload. ppointment in 2 weeks. [name] PA (already scheduled) 03/04/2023 3pm Return A Return appointment in 3 weeks. - Please see front desk to schedule appointment Return appointment in 1 month. - Please see front desk to schedule appointment Anesthetic (In clinic) Topical Lidocaine 5% applied to wound bed Bathing/ Shower/ Hygiene May shower with protection but do not get wound dressing(s) wet. Protect dressing(s) with water repellant cover (for example, large plastic bag) or a cast cover and may then take shower. Attridge, [name] ([id number]) [id number] Page 2 of 4 Edema Control - Lymphedema / SCD / Other Lymphedema Pumps. Use Lymphedema pumps on leg(s) 2-3 times a day for 45-60 minutes. If wearing any wraps or hose, do not remove them. Continue exercising as instructed. - *****pump 3 times a day for an hour each time.***** Elevate legs to the level of the heart or above for 30 minutes daily and/or when sitting for 3-4 times a day throughout the day. - +++ Important+++ ELEVATE THE LEGS>>>>>>>>>> Avoid standing for long periods of time. Exercise regularly Compression stocking or Garment 30-40 mm/Hg pressure to: - use juxtafits to both legs daily if compression wraps removed Other Edema Control Orders/Instructions: - When sitting please keep your feet up, keep legs at Heart Level or above. Use pillows behind calf to for comfort. Off-Loading Other: - When sitting please keep your feet up, keep legs at Heart Level or above. Use pillows behind calf to for comfort. Additional Orders / Instructions Follow Nutritious Diet - Try and increase protein intake. Goal of protein intake is 60g-100g. Wound Treatment Wound #12 - Lower Leg Wound Laterality: Left, Anterior Cleanser: Soap and Water 1 x Per  Week/30 Days Discharge Instructions: May shower and wash wound with dial antibacterial soap and water prior to dressing change. Peri-Wound Care: Sween Lotion (Moisturizing

## 2023-02-25 ENCOUNTER — Encounter (HOSPITAL_BASED_OUTPATIENT_CLINIC_OR_DEPARTMENT_OTHER): Payer: Medicare Other | Admitting: Physician Assistant

## 2023-02-25 DIAGNOSIS — I87333 Chronic venous hypertension (idiopathic) with ulcer and inflammation of bilateral lower extremity: Secondary | ICD-10-CM | POA: Diagnosis not present

## 2023-02-25 NOTE — Progress Notes (Signed)
[name], [name] ([id number]) [id number] Page 1 of 17 Visit Report for 02/25/2023 Chief Complaint Document Details Patient Name: Date of Service: [name], [name] 02/25/2023 3:15 PM Medical Record Number: [id number] Patient Account Number: [id number] Date of Birth/Sex: Treating RN: [birth year] (77 y.o. M) Primary Care Provider: [name] Other Clinician: Referring Provider: Treating Provider/Extender: [name] Weeks in Treatment: 17 Information Obtained from: Patient Chief Complaint 02/23/2020; patient is here for wounds on his bilateral lower legs in the setting of severe lymphedema 03/26/2022; patient is here for wounds on his bilateral lower legs medial aspect Electronic Signature(s) Signed: 02/25/2023 3:36:51 PM By: [name] PA-C Entered By: [name] on 02/25/2023 12:36:50 -------------------------------------------------------------------------------- Debridement Details Patient Name: Date of Service: [name], [name] 02/25/2023 3:15 PM Medical Record Number: [id number] Patient Account Number: [id number] Date of Birth/Sex: Treating RN: [birth year] (77 y.o. [name], Millard.Loa Primary Care Provider: [name] Other Clinician: Referring Provider: Treating Provider/Extender: [name] Weeks in Treatment: 48 Debridement Performed for Assessment: Wound #6 Right,Medial Lower Leg Performed By: Physician [name], PA The following information was scribed by: [name] The information was scribed for: [name] Debridement Type: Debridement Level of Consciousness (Pre-procedure): Awake and Alert Pre-procedure Verification/Time Out Yes - 15:45 Taken: Start Time: 15:46 Pain Control: Lidocaine 4% T opical Solution Percent of Wound Bed Debrided: 80% T Area Debrided (cm): otal 23.46 Tissue and other material debrided: Viable, Non-Viable, Slough, Subcutaneous, Skin: Dermis , Skin:  Epidermis, Slough Level: Skin/Subcutaneous Tissue Debridement Description: Excisional Instrument: Curette Bleeding: Minimum Hemostasis Achieved: Pressure End Time: 15:52 Procedural Pain: 0 Post Procedural Pain: 0 Response to Treatment: Procedure was tolerated well Level of Consciousness (Post- Awake and Alert procedure): Post Debridement Measurements of Total Wound Length: (cm) 8.3 Width: (cm) 4.5 Depth: (cm) 0.5 [name], [name] ([id number]) [id number] Page 2 of 17 Volume: (cm) 14.667 Character of Wound/Ulcer Post Debridement: Improved Post Procedure Diagnosis Same as Pre-procedure Electronic Signature(s) Signed: 02/25/2023 4:43:13 PM By: [name] PA-C Signed: 02/25/2023 5:49:45 PM By: [name] RN, [name] Entered By: [name] on 02/25/2023 12:52:34 -------------------------------------------------------------------------------- Debridement Details Patient Name: Date of Service: [name], [name] 02/25/2023 3:15 PM Medical Record Number: [id number] Patient Account Number: [id number] Date of Birth/Sex: Treating RN: [birth year] (77 y.o. [name], Millard.Loa Primary Care Provider: [name] Other Clinician: Referring Provider: Treating Provider/Extender: [name] Weeks in Treatment: 48 Debridement Performed for Assessment: Wound #6 Right,Medial Lower Leg Performed By: Physician [name], PA The following information was scribed by: [name] The information was scribed for: [name] Debridement Type: Debridement Level of Consciousness (Pre-procedure): Awake and Alert Pre-procedure Verification/Time Out Yes - 15:45 Taken: Start Time: 15:46 Pain Control: Lidocaine 4% T opical Solution Percent of Wound Bed Debrided: 80% T Area Debrided (cm): otal 24.02 Tissue and other material debrided: Viable, Non-Viable, Slough, Subcutaneous, Skin: Dermis , Skin: Epidermis, Slough Level: Skin/Subcutaneous  Tissue Debridement Description: Excisional Instrument: Curette Bleeding: Minimum Hemostasis Achieved: Pressure End Time: 15:52 Procedural Pain: 0 Post Procedural Pain: 0 Response to Treatment: Procedure was tolerated well Level of Consciousness (Post- Awake and Alert procedure): Post Debridement Measurements of Total Wound Length: (cm) 8.5 Width: (cm) 4.5 Depth: (cm) 0.5 Volume: (cm) 15.021 Character of Wound/Ulcer Post Debridement: Improved Post Procedure Diagnosis Same as Pre-procedure Electronic Signature(s) Signed: 02/25/2023 4:43:13 PM By: [name] PA-C Signed: 02/25/2023 5:49:45 PM By: [name] RN, [name] Entered By: [name] on 02/25/2023 12:54:45 [name],  [name], [name] ([id number]) [id number] Page 1 of 17 Visit Report for 02/25/2023 Chief Complaint Document Details Patient Name: Date of Service: [name], [name] 02/25/2023 3:15 PM Medical Record Number: [id number] Patient Account Number: [id number] Date of Birth/Sex: Treating RN: [birth year] (77 y.o. M) Primary Care Provider: [name] Other Clinician: Referring Provider: Treating Provider/Extender: [name] Weeks in Treatment: 17 Information Obtained from: Patient Chief Complaint 02/23/2020; patient is here for wounds on his bilateral lower legs in the setting of severe lymphedema 03/26/2022; patient is here for wounds on his bilateral lower legs medial aspect Electronic Signature(s) Signed: 02/25/2023 3:36:51 PM By: [name] PA-C Entered By: [name] on 02/25/2023 12:36:50 -------------------------------------------------------------------------------- Debridement Details Patient Name: Date of Service: [name], [name] 02/25/2023 3:15 PM Medical Record Number: [id number] Patient Account Number: [id number] Date of Birth/Sex: Treating RN: [birth year] (77 y.o. [name], Millard.Loa Primary Care Provider: [name] Other Clinician: Referring Provider: Treating Provider/Extender: [name] Weeks in Treatment: 48 Debridement Performed for Assessment: Wound #6 Right,Medial Lower Leg Performed By: Physician [name], PA The following information was scribed by: [name] The information was scribed for: [name] Debridement Type: Debridement Level of Consciousness (Pre-procedure): Awake and Alert Pre-procedure Verification/Time Out Yes - 15:45 Taken: Start Time: 15:46 Pain Control: Lidocaine 4% T opical Solution Percent of Wound Bed Debrided: 80% T Area Debrided (cm): otal 23.46 Tissue and other material debrided: Viable, Non-Viable, Slough, Subcutaneous, Skin: Dermis , Skin:  Epidermis, Slough Level: Skin/Subcutaneous Tissue Debridement Description: Excisional Instrument: Curette Bleeding: Minimum Hemostasis Achieved: Pressure End Time: 15:52 Procedural Pain: 0 Post Procedural Pain: 0 Response to Treatment: Procedure was tolerated well Level of Consciousness (Post- Awake and Alert procedure): Post Debridement Measurements of Total Wound Length: (cm) 8.3 Width: (cm) 4.5 Depth: (cm) 0.5 [name], [name] ([id number]) [id number] Page 2 of 17 Volume: (cm) 14.667 Character of Wound/Ulcer Post Debridement: Improved Post Procedure Diagnosis Same as Pre-procedure Electronic Signature(s) Signed: 02/25/2023 4:43:13 PM By: [name] PA-C Signed: 02/25/2023 5:49:45 PM By: [name] RN, [name] Entered By: [name] on 02/25/2023 12:52:34 -------------------------------------------------------------------------------- Debridement Details Patient Name: Date of Service: [name], [name] 02/25/2023 3:15 PM Medical Record Number: [id number] Patient Account Number: [id number] Date of Birth/Sex: Treating RN: [birth year] (77 y.o. [name], Millard.Loa Primary Care Provider: [name] Other Clinician: Referring Provider: Treating Provider/Extender: [name] Weeks in Treatment: 48 Debridement Performed for Assessment: Wound #6 Right,Medial Lower Leg Performed By: Physician [name], PA The following information was scribed by: [name] The information was scribed for: [name] Debridement Type: Debridement Level of Consciousness (Pre-procedure): Awake and Alert Pre-procedure Verification/Time Out Yes - 15:45 Taken: Start Time: 15:46 Pain Control: Lidocaine 4% T opical Solution Percent of Wound Bed Debrided: 80% T Area Debrided (cm): otal 24.02 Tissue and other material debrided: Viable, Non-Viable, Slough, Subcutaneous, Skin: Dermis , Skin: Epidermis, Slough Level: Skin/Subcutaneous  Tissue Debridement Description: Excisional Instrument: Curette Bleeding: Minimum Hemostasis Achieved: Pressure End Time: 15:52 Procedural Pain: 0 Post Procedural Pain: 0 Response to Treatment: Procedure was tolerated well Level of Consciousness (Post- Awake and Alert procedure): Post Debridement Measurements of Total Wound Length: (cm) 8.5 Width: (cm) 4.5 Depth: (cm) 0.5 Volume: (cm) 15.021 Character of Wound/Ulcer Post Debridement: Improved Post Procedure Diagnosis Same as Pre-procedure Electronic Signature(s) Signed: 02/25/2023 4:43:13 PM By: [name] PA-C Signed: 02/25/2023 5:49:45 PM By: [name] RN, [name] Entered By: [name] on 02/25/2023 12:54:45 [name],  Wound Laterality: Left,  Medial Cleanser: Soap and Water 1 x Per Week/30 Days Discharge Instructions: May shower and wash wound with dial antibacterial soap and water prior to dressing change. Peri-Wound Care: Sween Lotion (Moisturizing lotion) 1 x Per Week/30 Days Discharge Instructions: Apply moisturizing lotion as directed Prim Dressing: Aquacel Ag 1 x Per Week/30 Days ary Discharge Instructions: apply directly to wound bed. Secondary Dressing: ABD Pad, 8x10 1 x Per Week/30 Days Discharge Instructions: Apply over primary dressing as directed. Secondary Dressing: OptiLock Super Absorbent, 5x5.5 (in/in) (Generic) 1 x Per Week/30 Days Discharge Instructions: Apply directly to wound bed as directed Com pression Wrap: Urgo K2, (equivalent to a 4 layer) two layer compression system, regular 1 x Per Week/30 Days Discharge Instructions: Apply Urgo K2 as directed (alternative to 4 layer compression). 1. I would recommend that we have the patient continue to monitor for any signs of infection or worsening. Based on what I see I do feel like that he is doing better I think he is actually taking it hard to try to elevate his legs, take his fluid pills, and use lymphedema pumps. He wants to stay out of the hospital. 2. I would recommend as well that he continue to use the Aquacel Ag followed by the ABD pad and then the OPTi lock. 3. Will continue with Urgo K2 compression wraps which she should keep in place as well. We will see patient back for reevaluation in 1 week here in the clinic. If anything worsens or changes patient will contact our office for additional recommendations. Electronic Signature(s) Signed: 02/25/2023 4:49:37 PM By: [name] PA-C Entered By: [name] on 02/25/2023 13:49:36 Brash, [name] ([id number]) [id number] Page 17 of 17 -------------------------------------------------------------------------------- SuperBill Details Patient Name: Date of Service: Shelton, [name]  02/25/2023 Medical Record Number: [id number] Patient Account Number: [id number] Date of Birth/Sex: Treating RN: Dec 29, 1945 (77 y.o. [name], [name] Primary Care Provider: [name] Other Clinician: Referring Provider: Treating Provider/Extender: [name] Weeks in Treatment: 48 Diagnosis Coding ICD-10 Codes Code Description [phone] Chronic venous hypertension (idiopathic) with ulcer and inflammation of bilateral lower extremity I89.0 Lymphedema, not elsewhere classified 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.818 Non-pressure chronic ulcer of other part of right lower leg with other specified severity L84 Corns and callosities Facility Procedures : CPT4 Code: [id number] Description: 11042 - DEB SUBQ TISSUE 20 SQ CM/< ICD-10 Diagnosis Description L97.828 Non-pressure chronic ulcer of other part of left lower leg with other specified L97.818 Non-pressure chronic ulcer of other part of right lower leg with other specified Modifier: severity severity Quantity: 1 : CPT4 Code: [id number] Description: 11045 - DEB SUBQ TISS EA ADDL 20CM ICD-10 Diagnosis Description L97.818 Non-pressure chronic ulcer of other part of right lower leg with other specified L97.828 Non-pressure chronic ulcer of other part of left lower leg with other specified Modifier: severity severity Quantity: 2 Physician Procedures : CPT4 Code Description Modifier [id number] 11042 - WC PHYS SUBQ TISS 20 SQ CM ICD-10 Diagnosis Description 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 Quantity: 1 : [id number] 11045 - WC PHYS SUBQ TISS EA ADDL 20 CM ICD-10 Diagnosis Description L97.818 Non-pressure chronic ulcer of other part of right lower leg with other specified severity L97.828 Non-pressure chronic  ulcer of other part of left lower leg  Instructions: Apply Urgo K2 as directed (alternative to 4 layer compression). Wound #7 - Lower Leg Wound Laterality: Left, Medial Cleanser: Soap and Water 1 x Per Week/30 Days Discharge Instructions: May shower and wash wound with dial antibacterial soap and water prior to dressing change. Peri-Wound Care: Sween Lotion (Moisturizing lotion) 1 x Per Week/30 Days Discharge Instructions: Apply moisturizing lotion as directed Prim Dressing: Aquacel Ag 1 x Per Week/30  Days ary Discharge Instructions: apply directly to wound bed. Secondary Dressing: ABD Pad, 8x10 1 x Per Week/30 Days Discharge Instructions: Apply over primary dressing as directed. Secondary Dressing: OptiLock Super Absorbent, 5x5.5 (in/in) (Generic) 1 x Per Week/30 Days Discharge Instructions: Apply directly to wound bed as directed Compression Wrap: Urgo K2, (equivalent to a 4 layer) two layer compression system, regular 1 x Per Week/30 Days Discharge Instructions: Apply Urgo K2 as directed (alternative to 4 layer compression). Morad, [name] ([id number]) [id number] Page 10 of 17 Electronic Signature(s) Signed: 02/25/2023 4:43:13 PM By: [name] PA-C Signed: 02/25/2023 5:49:45 PM By: [name] RN, [name] Entered By: [name] on 02/25/2023 12:57:25 -------------------------------------------------------------------------------- Problem List Details Patient Name: Date of Service: [name], [name] 02/25/2023 3:15 PM Medical Record Number: [id number] Patient Account Number: [id number] Date of Birth/Sex: Treating RN: February 19, 1946 (77 y.o. 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 03/26/2022 No Yes severity L97.818 Non-pressure chronic ulcer of other part of right lower leg with other specified 03/26/2022 No Yes severity L84 Corns and callosities 10/15/2022 No Yes Inactive Problems Resolved Problems Electronic Signature(s) Signed: 02/25/2023 3:36:27 PM By: [name]  PA-C Entered By: [name] on 02/25/2023 12:36:27 -------------------------------------------------------------------------------- Progress Note Details Patient Name: Date of Service: [name], [name] 02/25/2023 3:15 PM Medical Record Number: [id number] Patient Account Number: [id number] [name], [name] ([id number]) [id number].pdf Page 11 of 17 Date of Birth/Sex: Treating RN: February 01, 1946 (77 y.o. M) Primary Care Provider: Other Clinician: [name] Referring Provider: Treating Provider/Extender: Leveda Anna, [name] Weeks in Treatment: 65 Subjective Chief Complaint Information obtained from Patient 02/23/2020; patient is here for wounds on his bilateral lower legs in the setting of severe lymphedema 03/26/2022; patient is here for wounds on his bilateral lower legs medial aspect History of Present Illness (HPI) ADMISSION 02/23/2020 Patient is a 77 year old man who lives in [place] who arrives accompanied by his wife. He has a history of chronic lymphedema and venous insufficiency in his bilateral lower legs which may have something to do that with having a history of DVT as well as being treated for prostate cancer. In any case he recently got compression pumps at home but compliance has been an issue here. He has compression stockings however they are probably not sufficient enough to control swelling. They tell us that things deteriorated for him in late August he was admitted to [facility] for 7 days. This was with cellulitis I think of his bilateral lower legs. Discharge he was noted to have wounds on his bilateral lower legs. He was discharged on Bactrim. They tried to get him home health through [facility] of course they declined him. His wife is  [name], [name] ([id number]) [id number] Page 1 of 17 Visit Report for 02/25/2023 Chief Complaint Document Details Patient Name: Date of Service: [name], [name] 02/25/2023 3:15 PM Medical Record Number: [id number] Patient Account Number: [id number] Date of Birth/Sex: Treating RN: [birth year] (77 y.o. M) Primary Care Provider: [name] Other Clinician: Referring Provider: Treating Provider/Extender: [name] Weeks in Treatment: 17 Information Obtained from: Patient Chief Complaint 02/23/2020; patient is here for wounds on his bilateral lower legs in the setting of severe lymphedema 03/26/2022; patient is here for wounds on his bilateral lower legs medial aspect Electronic Signature(s) Signed: 02/25/2023 3:36:51 PM By: [name] PA-C Entered By: [name] on 02/25/2023 12:36:50 -------------------------------------------------------------------------------- Debridement Details Patient Name: Date of Service: [name], [name] 02/25/2023 3:15 PM Medical Record Number: [id number] Patient Account Number: [id number] Date of Birth/Sex: Treating RN: [birth year] (77 y.o. [name], Millard.Loa Primary Care Provider: [name] Other Clinician: Referring Provider: Treating Provider/Extender: [name] Weeks in Treatment: 48 Debridement Performed for Assessment: Wound #6 Right,Medial Lower Leg Performed By: Physician [name], PA The following information was scribed by: [name] The information was scribed for: [name] Debridement Type: Debridement Level of Consciousness (Pre-procedure): Awake and Alert Pre-procedure Verification/Time Out Yes - 15:45 Taken: Start Time: 15:46 Pain Control: Lidocaine 4% T opical Solution Percent of Wound Bed Debrided: 80% T Area Debrided (cm): otal 23.46 Tissue and other material debrided: Viable, Non-Viable, Slough, Subcutaneous, Skin: Dermis , Skin:  Epidermis, Slough Level: Skin/Subcutaneous Tissue Debridement Description: Excisional Instrument: Curette Bleeding: Minimum Hemostasis Achieved: Pressure End Time: 15:52 Procedural Pain: 0 Post Procedural Pain: 0 Response to Treatment: Procedure was tolerated well Level of Consciousness (Post- Awake and Alert procedure): Post Debridement Measurements of Total Wound Length: (cm) 8.3 Width: (cm) 4.5 Depth: (cm) 0.5 [name], [name] ([id number]) [id number] Page 2 of 17 Volume: (cm) 14.667 Character of Wound/Ulcer Post Debridement: Improved Post Procedure Diagnosis Same as Pre-procedure Electronic Signature(s) Signed: 02/25/2023 4:43:13 PM By: [name] PA-C Signed: 02/25/2023 5:49:45 PM By: [name] RN, [name] Entered By: [name] on 02/25/2023 12:52:34 -------------------------------------------------------------------------------- Debridement Details Patient Name: Date of Service: [name], [name] 02/25/2023 3:15 PM Medical Record Number: [id number] Patient Account Number: [id number] Date of Birth/Sex: Treating RN: [birth year] (77 y.o. [name], Millard.Loa Primary Care Provider: [name] Other Clinician: Referring Provider: Treating Provider/Extender: [name] Weeks in Treatment: 48 Debridement Performed for Assessment: Wound #6 Right,Medial Lower Leg Performed By: Physician [name], PA The following information was scribed by: [name] The information was scribed for: [name] Debridement Type: Debridement Level of Consciousness (Pre-procedure): Awake and Alert Pre-procedure Verification/Time Out Yes - 15:45 Taken: Start Time: 15:46 Pain Control: Lidocaine 4% T opical Solution Percent of Wound Bed Debrided: 80% T Area Debrided (cm): otal 24.02 Tissue and other material debrided: Viable, Non-Viable, Slough, Subcutaneous, Skin: Dermis , Skin: Epidermis, Slough Level: Skin/Subcutaneous  Tissue Debridement Description: Excisional Instrument: Curette Bleeding: Minimum Hemostasis Achieved: Pressure End Time: 15:52 Procedural Pain: 0 Post Procedural Pain: 0 Response to Treatment: Procedure was tolerated well Level of Consciousness (Post- Awake and Alert procedure): Post Debridement Measurements of Total Wound Length: (cm) 8.5 Width: (cm) 4.5 Depth: (cm) 0.5 Volume: (cm) 15.021 Character of Wound/Ulcer Post Debridement: Improved Post Procedure Diagnosis Same as Pre-procedure Electronic Signature(s) Signed: 02/25/2023 4:43:13 PM By: [name] PA-C Signed: 02/25/2023 5:49:45 PM By: [name] RN, [name] Entered By: [name] on 02/25/2023 12:54:45 [name],  [name] ([id number]) [id number] Page 3 of 17 -------------------------------------------------------------------------------- Debridement Details Patient Name: Date of Service: [name], [name] 02/25/2023 3:15 PM Medical Record Number: [id number] Patient Account Number: [id number] Date of Birth/Sex: Treating RN: 05-13-1946 (77 y.o. [name] Primary Care Provider: [name] Other Clinician: Referring Provider: Treating Provider/Extender: [name] Weeks in Treatment: 48 Debridement Performed for Assessment: Wound #12 Left,Anterior Lower Leg Performed By: Physician [name], PA The following information was scribed by: [name] The information was scribed for: [name] Debridement Type: Debridement Level of Consciousness (Pre-procedure): Awake and Alert Pre-procedure Verification/Time Out Yes - 15:45 Taken: Start Time: 15:46 Pain Control: Lidocaine 4% T opical Solution Percent of Wound Bed Debrided: 100% T Area Debrided (cm): otal 0.52 Tissue and other material debrided: Viable, Non-Viable, Slough, Subcutaneous, Skin: Dermis , Skin: Epidermis, Slough Level: Skin/Subcutaneous Tissue Debridement Description:  Excisional Instrument: Curette Bleeding: Minimum Hemostasis Achieved: Pressure End Time: 15:52 Procedural Pain: 0 Post Procedural Pain: 0 Response to Treatment: Procedure was tolerated well Level of Consciousness (Post- Awake and Alert procedure): Post Debridement Measurements of Total Wound Length: (cm) 1.1 Width: (cm) 0.6 Depth: (cm) 0.1 Volume: (cm) 0.052 Character of Wound/Ulcer Post Debridement: Improved Post Procedure Diagnosis Same as Pre-procedure Electronic Signature(s) Signed: 02/25/2023 4:43:13 PM By: [name] PA-C Signed: 02/25/2023 5:49:45 PM By: [name] RN, [name] Entered By: [name] on 02/25/2023 12:55:30 -------------------------------------------------------------------------------- Debridement Details Patient Name: Date of Service: [name], [name] 02/25/2023 3:15 PM Medical Record Number: [id number] Patient Account Number: [id number] Date of Birth/Sex: Treating RN: [birth year] (77 y.o. [name] Primary Care Provider: [name] Other Clinician: Referring Provider: Treating Provider/Extender: [name] Weeks in Treatment: 48 Debridement Performed for Assessment: Wound #7 Left,Medial Lower Leg Performed By: Physician [name], PA The following information was scribed by: [name] The information was scribed for: [name] Debridement Type: Debridement Llera, [name] ([id number]) [id number] Page 4 of 17 Level of Consciousness (Pre-procedure): Awake and Alert Pre-procedure Verification/Time Out Yes - 15:45 Taken: Start Time: 15:46 Pain Control: Lidocaine 4% T opical Solution Percent of Wound Bed Debrided: 80% T Area Debrided (cm): otal 9.95 Tissue and other material debrided: Viable, Non-Viable, Slough, Subcutaneous, Skin: Dermis , Skin: Epidermis, Slough Level: Skin/Subcutaneous Tissue Debridement Description: Excisional Instrument: Curette Bleeding:  Minimum Hemostasis Achieved: Pressure End Time: 15:52 Procedural Pain: 0 Post Procedural Pain: 0 Response to Treatment: Procedure was tolerated well Level of Consciousness (Post- Awake and Alert procedure): Post Debridement Measurements of Total Wound Length: (cm) 4.8 Width: (cm) 3.3 Depth: (cm) 0.2 Volume: (cm) 2.488 Character of Wound/Ulcer Post Debridement: Improved Post Procedure Diagnosis Same as Pre-procedure Electronic Signature(s) Signed: 02/25/2023 4:43:13 PM By: [name] PA-C Signed: 02/25/2023 5:49:45 PM By: [name] RN, [name] Entered By: [name] on 02/25/2023 12:56:02 -------------------------------------------------------------------------------- HPI Details Patient Name: Date of Service: [name], [name] 02/25/2023 3:15 PM Medical Record Number: [id number] Patient Account Number: [id number] Date of Birth/Sex: Treating RN: 1946/01/30 (77 y.o. M) Primary Care Provider: [name] Other Clinician: Referring Provider: Treating Provider/Extender: [name] Weeks in Treatment: 42 History of Present Illness HPI Description: ADMISSION 02/23/2020 Patient is a 78 year old man who lives in [place] who arrives accompanied by his wife. He has a history of chronic lymphedema and venous insufficiency in his bilateral lower legs which may have something to do that with having a history of DVT as well as being treated for prostate cancer. In  [name] ([id number]) [id number] Page 3 of 17 -------------------------------------------------------------------------------- Debridement Details Patient Name: Date of Service: [name], [name] 02/25/2023 3:15 PM Medical Record Number: [id number] Patient Account Number: [id number] Date of Birth/Sex: Treating RN: 05-13-1946 (77 y.o. [name] Primary Care Provider: [name] Other Clinician: Referring Provider: Treating Provider/Extender: [name] Weeks in Treatment: 48 Debridement Performed for Assessment: Wound #12 Left,Anterior Lower Leg Performed By: Physician [name], PA The following information was scribed by: [name] The information was scribed for: [name] Debridement Type: Debridement Level of Consciousness (Pre-procedure): Awake and Alert Pre-procedure Verification/Time Out Yes - 15:45 Taken: Start Time: 15:46 Pain Control: Lidocaine 4% T opical Solution Percent of Wound Bed Debrided: 100% T Area Debrided (cm): otal 0.52 Tissue and other material debrided: Viable, Non-Viable, Slough, Subcutaneous, Skin: Dermis , Skin: Epidermis, Slough Level: Skin/Subcutaneous Tissue Debridement Description:  Excisional Instrument: Curette Bleeding: Minimum Hemostasis Achieved: Pressure End Time: 15:52 Procedural Pain: 0 Post Procedural Pain: 0 Response to Treatment: Procedure was tolerated well Level of Consciousness (Post- Awake and Alert procedure): Post Debridement Measurements of Total Wound Length: (cm) 1.1 Width: (cm) 0.6 Depth: (cm) 0.1 Volume: (cm) 0.052 Character of Wound/Ulcer Post Debridement: Improved Post Procedure Diagnosis Same as Pre-procedure Electronic Signature(s) Signed: 02/25/2023 4:43:13 PM By: [name] PA-C Signed: 02/25/2023 5:49:45 PM By: [name] RN, [name] Entered By: [name] on 02/25/2023 12:55:30 -------------------------------------------------------------------------------- Debridement Details Patient Name: Date of Service: [name], [name] 02/25/2023 3:15 PM Medical Record Number: [id number] Patient Account Number: [id number] Date of Birth/Sex: Treating RN: [birth year] (77 y.o. [name] Primary Care Provider: [name] Other Clinician: Referring Provider: Treating Provider/Extender: [name] Weeks in Treatment: 48 Debridement Performed for Assessment: Wound #7 Left,Medial Lower Leg Performed By: Physician [name], PA The following information was scribed by: [name] The information was scribed for: [name] Debridement Type: Debridement Llera, [name] ([id number]) [id number] Page 4 of 17 Level of Consciousness (Pre-procedure): Awake and Alert Pre-procedure Verification/Time Out Yes - 15:45 Taken: Start Time: 15:46 Pain Control: Lidocaine 4% T opical Solution Percent of Wound Bed Debrided: 80% T Area Debrided (cm): otal 9.95 Tissue and other material debrided: Viable, Non-Viable, Slough, Subcutaneous, Skin: Dermis , Skin: Epidermis, Slough Level: Skin/Subcutaneous Tissue Debridement Description: Excisional Instrument: Curette Bleeding:  Minimum Hemostasis Achieved: Pressure End Time: 15:52 Procedural Pain: 0 Post Procedural Pain: 0 Response to Treatment: Procedure was tolerated well Level of Consciousness (Post- Awake and Alert procedure): Post Debridement Measurements of Total Wound Length: (cm) 4.8 Width: (cm) 3.3 Depth: (cm) 0.2 Volume: (cm) 2.488 Character of Wound/Ulcer Post Debridement: Improved Post Procedure Diagnosis Same as Pre-procedure Electronic Signature(s) Signed: 02/25/2023 4:43:13 PM By: [name] PA-C Signed: 02/25/2023 5:49:45 PM By: [name] RN, [name] Entered By: [name] on 02/25/2023 12:56:02 -------------------------------------------------------------------------------- HPI Details Patient Name: Date of Service: [name], [name] 02/25/2023 3:15 PM Medical Record Number: [id number] Patient Account Number: [id number] Date of Birth/Sex: Treating RN: 1946/01/30 (77 y.o. M) Primary Care Provider: [name] Other Clinician: Referring Provider: Treating Provider/Extender: [name] Weeks in Treatment: 42 History of Present Illness HPI Description: ADMISSION 02/23/2020 Patient is a 78 year old man who lives in [place] who arrives accompanied by his wife. He has a history of chronic lymphedema and venous insufficiency in his bilateral lower legs which may have something to do that with having a history of DVT as well as being treated for prostate cancer. In  Instructions: Apply Urgo K2 as directed (alternative to 4 layer compression). Wound #7 - Lower Leg Wound Laterality: Left, Medial Cleanser: Soap and Water 1 x Per Week/30 Days Discharge Instructions: May shower and wash wound with dial antibacterial soap and water prior to dressing change. Peri-Wound Care: Sween Lotion (Moisturizing lotion) 1 x Per Week/30 Days Discharge Instructions: Apply moisturizing lotion as directed Prim Dressing: Aquacel Ag 1 x Per Week/30  Days ary Discharge Instructions: apply directly to wound bed. Secondary Dressing: ABD Pad, 8x10 1 x Per Week/30 Days Discharge Instructions: Apply over primary dressing as directed. Secondary Dressing: OptiLock Super Absorbent, 5x5.5 (in/in) (Generic) 1 x Per Week/30 Days Discharge Instructions: Apply directly to wound bed as directed Compression Wrap: Urgo K2, (equivalent to a 4 layer) two layer compression system, regular 1 x Per Week/30 Days Discharge Instructions: Apply Urgo K2 as directed (alternative to 4 layer compression). Morad, [name] ([id number]) [id number] Page 10 of 17 Electronic Signature(s) Signed: 02/25/2023 4:43:13 PM By: [name] PA-C Signed: 02/25/2023 5:49:45 PM By: [name] RN, [name] Entered By: [name] on 02/25/2023 12:57:25 -------------------------------------------------------------------------------- Problem List Details Patient Name: Date of Service: [name], [name] 02/25/2023 3:15 PM Medical Record Number: [id number] Patient Account Number: [id number] Date of Birth/Sex: Treating RN: February 19, 1946 (77 y.o. 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 03/26/2022 No Yes severity L97.818 Non-pressure chronic ulcer of other part of right lower leg with other specified 03/26/2022 No Yes severity L84 Corns and callosities 10/15/2022 No Yes Inactive Problems Resolved Problems Electronic Signature(s) Signed: 02/25/2023 3:36:27 PM By: [name]  PA-C Entered By: [name] on 02/25/2023 12:36:27 -------------------------------------------------------------------------------- Progress Note Details Patient Name: Date of Service: [name], [name] 02/25/2023 3:15 PM Medical Record Number: [id number] Patient Account Number: [id number] [name], [name] ([id number]) [id number].pdf Page 11 of 17 Date of Birth/Sex: Treating RN: February 01, 1946 (77 y.o. M) Primary Care Provider: Other Clinician: [name] Referring Provider: Treating Provider/Extender: Leveda Anna, [name] Weeks in Treatment: 65 Subjective Chief Complaint Information obtained from Patient 02/23/2020; patient is here for wounds on his bilateral lower legs in the setting of severe lymphedema 03/26/2022; patient is here for wounds on his bilateral lower legs medial aspect History of Present Illness (HPI) ADMISSION 02/23/2020 Patient is a 77 year old man who lives in [place] who arrives accompanied by his wife. He has a history of chronic lymphedema and venous insufficiency in his bilateral lower legs which may have something to do that with having a history of DVT as well as being treated for prostate cancer. In any case he recently got compression pumps at home but compliance has been an issue here. He has compression stockings however they are probably not sufficient enough to control swelling. They tell us that things deteriorated for him in late August he was admitted to [facility] for 7 days. This was with cellulitis I think of his bilateral lower legs. Discharge he was noted to have wounds on his bilateral lower legs. He was discharged on Bactrim. They tried to get him home health through [facility] part C of course they declined him. His wife is  [name], [name] ([id number]) [id number] Page 1 of 17 Visit Report for 02/25/2023 Chief Complaint Document Details Patient Name: Date of Service: [name], [name] 02/25/2023 3:15 PM Medical Record Number: [id number] Patient Account Number: [id number] Date of Birth/Sex: Treating RN: [birth year] (77 y.o. M) Primary Care Provider: [name] Other Clinician: Referring Provider: Treating Provider/Extender: [name] Weeks in Treatment: 17 Information Obtained from: Patient Chief Complaint 02/23/2020; patient is here for wounds on his bilateral lower legs in the setting of severe lymphedema 03/26/2022; patient is here for wounds on his bilateral lower legs medial aspect Electronic Signature(s) Signed: 02/25/2023 3:36:51 PM By: [name] PA-C Entered By: [name] on 02/25/2023 12:36:50 -------------------------------------------------------------------------------- Debridement Details Patient Name: Date of Service: [name], [name] 02/25/2023 3:15 PM Medical Record Number: [id number] Patient Account Number: [id number] Date of Birth/Sex: Treating RN: [birth year] (77 y.o. [name], Millard.Loa Primary Care Provider: [name] Other Clinician: Referring Provider: Treating Provider/Extender: [name] Weeks in Treatment: 48 Debridement Performed for Assessment: Wound #6 Right,Medial Lower Leg Performed By: Physician [name], PA The following information was scribed by: [name] The information was scribed for: [name] Debridement Type: Debridement Level of Consciousness (Pre-procedure): Awake and Alert Pre-procedure Verification/Time Out Yes - 15:45 Taken: Start Time: 15:46 Pain Control: Lidocaine 4% T opical Solution Percent of Wound Bed Debrided: 80% T Area Debrided (cm): otal 23.46 Tissue and other material debrided: Viable, Non-Viable, Slough, Subcutaneous, Skin: Dermis , Skin:  Epidermis, Slough Level: Skin/Subcutaneous Tissue Debridement Description: Excisional Instrument: Curette Bleeding: Minimum Hemostasis Achieved: Pressure End Time: 15:52 Procedural Pain: 0 Post Procedural Pain: 0 Response to Treatment: Procedure was tolerated well Level of Consciousness (Post- Awake and Alert procedure): Post Debridement Measurements of Total Wound Length: (cm) 8.3 Width: (cm) 4.5 Depth: (cm) 0.5 [name], [name] ([id number]) [id number] Page 2 of 17 Volume: (cm) 14.667 Character of Wound/Ulcer Post Debridement: Improved Post Procedure Diagnosis Same as Pre-procedure Electronic Signature(s) Signed: 02/25/2023 4:43:13 PM By: [name] PA-C Signed: 02/25/2023 5:49:45 PM By: [name] RN, [name] Entered By: [name] on 02/25/2023 12:52:34 -------------------------------------------------------------------------------- Debridement Details Patient Name: Date of Service: [name], [name] 02/25/2023 3:15 PM Medical Record Number: [id number] Patient Account Number: [id number] Date of Birth/Sex: Treating RN: [birth year] (77 y.o. [name], Millard.Loa Primary Care Provider: [name] Other Clinician: Referring Provider: Treating Provider/Extender: [name] Weeks in Treatment: 48 Debridement Performed for Assessment: Wound #6 Right,Medial Lower Leg Performed By: Physician [name], PA The following information was scribed by: [name] The information was scribed for: [name] Debridement Type: Debridement Level of Consciousness (Pre-procedure): Awake and Alert Pre-procedure Verification/Time Out Yes - 15:45 Taken: Start Time: 15:46 Pain Control: Lidocaine 4% T opical Solution Percent of Wound Bed Debrided: 80% T Area Debrided (cm): otal 24.02 Tissue and other material debrided: Viable, Non-Viable, Slough, Subcutaneous, Skin: Dermis , Skin: Epidermis, Slough Level: Skin/Subcutaneous  Tissue Debridement Description: Excisional Instrument: Curette Bleeding: Minimum Hemostasis Achieved: Pressure End Time: 15:52 Procedural Pain: 0 Post Procedural Pain: 0 Response to Treatment: Procedure was tolerated well Level of Consciousness (Post- Awake and Alert procedure): Post Debridement Measurements of Total Wound Length: (cm) 8.5 Width: (cm) 4.5 Depth: (cm) 0.5 Volume: (cm) 15.021 Character of Wound/Ulcer Post Debridement: Improved Post Procedure Diagnosis Same as Pre-procedure Electronic Signature(s) Signed: 02/25/2023 4:43:13 PM By: [name] PA-C Signed: 02/25/2023 5:49:45 PM By: [name] RN, [name] Entered By: [name] on 02/25/2023 12:54:45 [name],  Instructions: Apply Urgo K2 as directed (alternative to 4 layer compression). Wound #7 - Lower Leg Wound Laterality: Left, Medial Cleanser: Soap and Water 1 x Per Week/30 Days Discharge Instructions: May shower and wash wound with dial antibacterial soap and water prior to dressing change. Peri-Wound Care: Sween Lotion (Moisturizing lotion) 1 x Per Week/30 Days Discharge Instructions: Apply moisturizing lotion as directed Prim Dressing: Aquacel Ag 1 x Per Week/30  Days ary Discharge Instructions: apply directly to wound bed. Secondary Dressing: ABD Pad, 8x10 1 x Per Week/30 Days Discharge Instructions: Apply over primary dressing as directed. Secondary Dressing: OptiLock Super Absorbent, 5x5.5 (in/in) (Generic) 1 x Per Week/30 Days Discharge Instructions: Apply directly to wound bed as directed Compression Wrap: Urgo K2, (equivalent to a 4 layer) two layer compression system, regular 1 x Per Week/30 Days Discharge Instructions: Apply Urgo K2 as directed (alternative to 4 layer compression). Morad, [name] ([id number]) [id number] Page 10 of 17 Electronic Signature(s) Signed: 02/25/2023 4:43:13 PM By: [name] PA-C Signed: 02/25/2023 5:49:45 PM By: [name] RN, [name] Entered By: [name] on 02/25/2023 12:57:25 -------------------------------------------------------------------------------- Problem List Details Patient Name: Date of Service: [name], [name] 02/25/2023 3:15 PM Medical Record Number: [id number] Patient Account Number: [id number] Date of Birth/Sex: Treating RN: February 19, 1946 (77 y.o. 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 03/26/2022 No Yes severity L97.818 Non-pressure chronic ulcer of other part of right lower leg with other specified 03/26/2022 No Yes severity L84 Corns and callosities 10/15/2022 No Yes Inactive Problems Resolved Problems Electronic Signature(s) Signed: 02/25/2023 3:36:27 PM By: [name]  PA-C Entered By: [name] on 02/25/2023 12:36:27 -------------------------------------------------------------------------------- Progress Note Details Patient Name: Date of Service: [name], [name] 02/25/2023 3:15 PM Medical Record Number: [id number] Patient Account Number: [id number] [name], [name] ([id number]) [id number].pdf Page 11 of 17 Date of Birth/Sex: Treating RN: February 01, 1946 (77 y.o. M) Primary Care Provider: Other Clinician: [name] Referring Provider: Treating Provider/Extender: Leveda Anna, [name] Weeks in Treatment: 65 Subjective Chief Complaint Information obtained from Patient 02/23/2020; patient is here for wounds on his bilateral lower legs in the setting of severe lymphedema 03/26/2022; patient is here for wounds on his bilateral lower legs medial aspect History of Present Illness (HPI) ADMISSION 02/23/2020 Patient is a 77 year old man who lives in [place] who arrives accompanied by his wife. He has a history of chronic lymphedema and venous insufficiency in his bilateral lower legs which may have something to do that with having a history of DVT as well as being treated for prostate cancer. In any case he recently got compression pumps at home but compliance has been an issue here. He has compression stockings however they are probably not sufficient enough to control swelling. They tell us that things deteriorated for him in late August he was admitted to [facility] for 7 days. This was with cellulitis I think of his bilateral lower legs. Discharge he was noted to have wounds on his bilateral lower legs. He was discharged on Bactrim. They tried to get him home health through [facility] part C of course they declined him. His wife is  [name] ([id number]) [id number] Page 3 of 17 -------------------------------------------------------------------------------- Debridement Details Patient Name: Date of Service: [name], [name] 02/25/2023 3:15 PM Medical Record Number: [id number] Patient Account Number: [id number] Date of Birth/Sex: Treating RN: 05-13-1946 (77 y.o. [name] Primary Care Provider: [name] Other Clinician: Referring Provider: Treating Provider/Extender: [name] Weeks in Treatment: 48 Debridement Performed for Assessment: Wound #12 Left,Anterior Lower Leg Performed By: Physician [name], PA The following information was scribed by: [name] The information was scribed for: [name] Debridement Type: Debridement Level of Consciousness (Pre-procedure): Awake and Alert Pre-procedure Verification/Time Out Yes - 15:45 Taken: Start Time: 15:46 Pain Control: Lidocaine 4% T opical Solution Percent of Wound Bed Debrided: 100% T Area Debrided (cm): otal 0.52 Tissue and other material debrided: Viable, Non-Viable, Slough, Subcutaneous, Skin: Dermis , Skin: Epidermis, Slough Level: Skin/Subcutaneous Tissue Debridement Description:  Excisional Instrument: Curette Bleeding: Minimum Hemostasis Achieved: Pressure End Time: 15:52 Procedural Pain: 0 Post Procedural Pain: 0 Response to Treatment: Procedure was tolerated well Level of Consciousness (Post- Awake and Alert procedure): Post Debridement Measurements of Total Wound Length: (cm) 1.1 Width: (cm) 0.6 Depth: (cm) 0.1 Volume: (cm) 0.052 Character of Wound/Ulcer Post Debridement: Improved Post Procedure Diagnosis Same as Pre-procedure Electronic Signature(s) Signed: 02/25/2023 4:43:13 PM By: [name] PA-C Signed: 02/25/2023 5:49:45 PM By: [name] RN, [name] Entered By: [name] on 02/25/2023 12:55:30 -------------------------------------------------------------------------------- Debridement Details Patient Name: Date of Service: [name], [name] 02/25/2023 3:15 PM Medical Record Number: [id number] Patient Account Number: [id number] Date of Birth/Sex: Treating RN: [birth year] (77 y.o. [name] Primary Care Provider: [name] Other Clinician: Referring Provider: Treating Provider/Extender: [name] Weeks in Treatment: 48 Debridement Performed for Assessment: Wound #7 Left,Medial Lower Leg Performed By: Physician [name], PA The following information was scribed by: [name] The information was scribed for: [name] Debridement Type: Debridement Llera, [name] ([id number]) [id number] Page 4 of 17 Level of Consciousness (Pre-procedure): Awake and Alert Pre-procedure Verification/Time Out Yes - 15:45 Taken: Start Time: 15:46 Pain Control: Lidocaine 4% T opical Solution Percent of Wound Bed Debrided: 80% T Area Debrided (cm): otal 9.95 Tissue and other material debrided: Viable, Non-Viable, Slough, Subcutaneous, Skin: Dermis , Skin: Epidermis, Slough Level: Skin/Subcutaneous Tissue Debridement Description: Excisional Instrument: Curette Bleeding:  Minimum Hemostasis Achieved: Pressure End Time: 15:52 Procedural Pain: 0 Post Procedural Pain: 0 Response to Treatment: Procedure was tolerated well Level of Consciousness (Post- Awake and Alert procedure): Post Debridement Measurements of Total Wound Length: (cm) 4.8 Width: (cm) 3.3 Depth: (cm) 0.2 Volume: (cm) 2.488 Character of Wound/Ulcer Post Debridement: Improved Post Procedure Diagnosis Same as Pre-procedure Electronic Signature(s) Signed: 02/25/2023 4:43:13 PM By: [name] PA-C Signed: 02/25/2023 5:49:45 PM By: [name] RN, [name] Entered By: [name] on 02/25/2023 12:56:02 -------------------------------------------------------------------------------- HPI Details Patient Name: Date of Service: [name], [name] 02/25/2023 3:15 PM Medical Record Number: [id number] Patient Account Number: [id number] Date of Birth/Sex: Treating RN: 1946/01/30 (77 y.o. M) Primary Care Provider: [name] Other Clinician: Referring Provider: Treating Provider/Extender: [name] Weeks in Treatment: 42 History of Present Illness HPI Description: ADMISSION 02/23/2020 Patient is a 78 year old man who lives in [place] who arrives accompanied by his wife. He has a history of chronic lymphedema and venous insufficiency in his bilateral lower legs which may have something to do that with having a history of DVT as well as being treated for prostate cancer. In  Instructions: Apply Urgo K2 as directed (alternative to 4 layer compression). Wound #7 - Lower Leg Wound Laterality: Left, Medial Cleanser: Soap and Water 1 x Per Week/30 Days Discharge Instructions: May shower and wash wound with dial antibacterial soap and water prior to dressing change. Peri-Wound Care: Sween Lotion (Moisturizing lotion) 1 x Per Week/30 Days Discharge Instructions: Apply moisturizing lotion as directed Prim Dressing: Aquacel Ag 1 x Per Week/30  Days ary Discharge Instructions: apply directly to wound bed. Secondary Dressing: ABD Pad, 8x10 1 x Per Week/30 Days Discharge Instructions: Apply over primary dressing as directed. Secondary Dressing: OptiLock Super Absorbent, 5x5.5 (in/in) (Generic) 1 x Per Week/30 Days Discharge Instructions: Apply directly to wound bed as directed Compression Wrap: Urgo K2, (equivalent to a 4 layer) two layer compression system, regular 1 x Per Week/30 Days Discharge Instructions: Apply Urgo K2 as directed (alternative to 4 layer compression). Morad, [name] ([id number]) [id number] Page 10 of 17 Electronic Signature(s) Signed: 02/25/2023 4:43:13 PM By: [name] PA-C Signed: 02/25/2023 5:49:45 PM By: [name] RN, [name] Entered By: [name] on 02/25/2023 12:57:25 -------------------------------------------------------------------------------- Problem List Details Patient Name: Date of Service: [name], [name] 02/25/2023 3:15 PM Medical Record Number: [id number] Patient Account Number: [id number] Date of Birth/Sex: Treating RN: February 19, 1946 (77 y.o. 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 03/26/2022 No Yes severity L97.818 Non-pressure chronic ulcer of other part of right lower leg with other specified 03/26/2022 No Yes severity L84 Corns and callosities 10/15/2022 No Yes Inactive Problems Resolved Problems Electronic Signature(s) Signed: 02/25/2023 3:36:27 PM By: [name]  PA-C Entered By: [name] on 02/25/2023 12:36:27 -------------------------------------------------------------------------------- Progress Note Details Patient Name: Date of Service: [name], [name] 02/25/2023 3:15 PM Medical Record Number: [id number] Patient Account Number: [id number] [name], [name] ([id number]) [id number].pdf Page 11 of 17 Date of Birth/Sex: Treating RN: February 01, 1946 (77 y.o. M) Primary Care Provider: Other Clinician: [name] Referring Provider: Treating Provider/Extender: Leveda Anna, [name] Weeks in Treatment: 65 Subjective Chief Complaint Information obtained from Patient 02/23/2020; patient is here for wounds on his bilateral lower legs in the setting of severe lymphedema 03/26/2022; patient is here for wounds on his bilateral lower legs medial aspect History of Present Illness (HPI) ADMISSION 02/23/2020 Patient is a 77 year old man who lives in [place] who arrives accompanied by his wife. He has a history of chronic lymphedema and venous insufficiency in his bilateral lower legs which may have something to do that with having a history of DVT as well as being treated for prostate cancer. In any case he recently got compression pumps at home but compliance has been an issue here. He has compression stockings however they are probably not sufficient enough to control swelling. They tell us that things deteriorated for him in late August he was admitted to [facility] for 7 days. This was with cellulitis I think of his bilateral lower legs. Discharge he was noted to have wounds on his bilateral lower legs. He was discharged on Bactrim. They tried to get him home health through [facility] part C of course they declined him. His wife is  [name], [name] ([id number]) [id number] Page 1 of 17 Visit Report for 02/25/2023 Chief Complaint Document Details Patient Name: Date of Service: [name], [name] 02/25/2023 3:15 PM Medical Record Number: [id number] Patient Account Number: [id number] Date of Birth/Sex: Treating RN: [birth year] (77 y.o. M) Primary Care Provider: [name] Other Clinician: Referring Provider: Treating Provider/Extender: [name] Weeks in Treatment: 17 Information Obtained from: Patient Chief Complaint 02/23/2020; patient is here for wounds on his bilateral lower legs in the setting of severe lymphedema 03/26/2022; patient is here for wounds on his bilateral lower legs medial aspect Electronic Signature(s) Signed: 02/25/2023 3:36:51 PM By: [name] PA-C Entered By: [name] on 02/25/2023 12:36:50 -------------------------------------------------------------------------------- Debridement Details Patient Name: Date of Service: [name], [name] 02/25/2023 3:15 PM Medical Record Number: [id number] Patient Account Number: [id number] Date of Birth/Sex: Treating RN: [birth year] (77 y.o. [name], Millard.Loa Primary Care Provider: [name] Other Clinician: Referring Provider: Treating Provider/Extender: [name] Weeks in Treatment: 48 Debridement Performed for Assessment: Wound #6 Right,Medial Lower Leg Performed By: Physician [name], PA The following information was scribed by: [name] The information was scribed for: [name] Debridement Type: Debridement Level of Consciousness (Pre-procedure): Awake and Alert Pre-procedure Verification/Time Out Yes - 15:45 Taken: Start Time: 15:46 Pain Control: Lidocaine 4% T opical Solution Percent of Wound Bed Debrided: 80% T Area Debrided (cm): otal 23.46 Tissue and other material debrided: Viable, Non-Viable, Slough, Subcutaneous, Skin: Dermis , Skin:  Epidermis, Slough Level: Skin/Subcutaneous Tissue Debridement Description: Excisional Instrument: Curette Bleeding: Minimum Hemostasis Achieved: Pressure End Time: 15:52 Procedural Pain: 0 Post Procedural Pain: 0 Response to Treatment: Procedure was tolerated well Level of Consciousness (Post- Awake and Alert procedure): Post Debridement Measurements of Total Wound Length: (cm) 8.3 Width: (cm) 4.5 Depth: (cm) 0.5 [name], [name] ([id number]) [id number] Page 2 of 17 Volume: (cm) 14.667 Character of Wound/Ulcer Post Debridement: Improved Post Procedure Diagnosis Same as Pre-procedure Electronic Signature(s) Signed: 02/25/2023 4:43:13 PM By: [name] PA-C Signed: 02/25/2023 5:49:45 PM By: [name] RN, [name] Entered By: [name] on 02/25/2023 12:52:34 -------------------------------------------------------------------------------- Debridement Details Patient Name: Date of Service: [name], [name] 02/25/2023 3:15 PM Medical Record Number: [id number] Patient Account Number: [id number] Date of Birth/Sex: Treating RN: [birth year] (77 y.o. [name], Millard.Loa Primary Care Provider: [name] Other Clinician: Referring Provider: Treating Provider/Extender: [name] Weeks in Treatment: 48 Debridement Performed for Assessment: Wound #6 Right,Medial Lower Leg Performed By: Physician [name], PA The following information was scribed by: [name] The information was scribed for: [name] Debridement Type: Debridement Level of Consciousness (Pre-procedure): Awake and Alert Pre-procedure Verification/Time Out Yes - 15:45 Taken: Start Time: 15:46 Pain Control: Lidocaine 4% T opical Solution Percent of Wound Bed Debrided: 80% T Area Debrided (cm): otal 24.02 Tissue and other material debrided: Viable, Non-Viable, Slough, Subcutaneous, Skin: Dermis , Skin: Epidermis, Slough Level: Skin/Subcutaneous  Tissue Debridement Description: Excisional Instrument: Curette Bleeding: Minimum Hemostasis Achieved: Pressure End Time: 15:52 Procedural Pain: 0 Post Procedural Pain: 0 Response to Treatment: Procedure was tolerated well Level of Consciousness (Post- Awake and Alert procedure): Post Debridement Measurements of Total Wound Length: (cm) 8.5 Width: (cm) 4.5 Depth: (cm) 0.5 Volume: (cm) 15.021 Character of Wound/Ulcer Post Debridement: Improved Post Procedure Diagnosis Same as Pre-procedure Electronic Signature(s) Signed: 02/25/2023 4:43:13 PM By: [name] PA-C Signed: 02/25/2023 5:49:45 PM By: [name] RN, [name] Entered By: [name] on 02/25/2023 12:54:45 [name],  [name] ([id number]) [id number] Page 3 of 17 -------------------------------------------------------------------------------- Debridement Details Patient Name: Date of Service: [name], [name] 02/25/2023 3:15 PM Medical Record Number: [id number] Patient Account Number: [id number] Date of Birth/Sex: Treating RN: 05-13-1946 (77 y.o. [name] Primary Care Provider: [name] Other Clinician: Referring Provider: Treating Provider/Extender: [name] Weeks in Treatment: 48 Debridement Performed for Assessment: Wound #12 Left,Anterior Lower Leg Performed By: Physician [name], PA The following information was scribed by: [name] The information was scribed for: [name] Debridement Type: Debridement Level of Consciousness (Pre-procedure): Awake and Alert Pre-procedure Verification/Time Out Yes - 15:45 Taken: Start Time: 15:46 Pain Control: Lidocaine 4% T opical Solution Percent of Wound Bed Debrided: 100% T Area Debrided (cm): otal 0.52 Tissue and other material debrided: Viable, Non-Viable, Slough, Subcutaneous, Skin: Dermis , Skin: Epidermis, Slough Level: Skin/Subcutaneous Tissue Debridement Description:  Excisional Instrument: Curette Bleeding: Minimum Hemostasis Achieved: Pressure End Time: 15:52 Procedural Pain: 0 Post Procedural Pain: 0 Response to Treatment: Procedure was tolerated well Level of Consciousness (Post- Awake and Alert procedure): Post Debridement Measurements of Total Wound Length: (cm) 1.1 Width: (cm) 0.6 Depth: (cm) 0.1 Volume: (cm) 0.052 Character of Wound/Ulcer Post Debridement: Improved Post Procedure Diagnosis Same as Pre-procedure Electronic Signature(s) Signed: 02/25/2023 4:43:13 PM By: [name] PA-C Signed: 02/25/2023 5:49:45 PM By: [name] RN, [name] Entered By: [name] on 02/25/2023 12:55:30 -------------------------------------------------------------------------------- Debridement Details Patient Name: Date of Service: [name], [name] 02/25/2023 3:15 PM Medical Record Number: [id number] Patient Account Number: [id number] Date of Birth/Sex: Treating RN: [birth year] (77 y.o. [name] Primary Care Provider: [name] Other Clinician: Referring Provider: Treating Provider/Extender: [name] Weeks in Treatment: 48 Debridement Performed for Assessment: Wound #7 Left,Medial Lower Leg Performed By: Physician [name], PA The following information was scribed by: [name] The information was scribed for: [name] Debridement Type: Debridement Llera, [name] ([id number]) [id number] Page 4 of 17 Level of Consciousness (Pre-procedure): Awake and Alert Pre-procedure Verification/Time Out Yes - 15:45 Taken: Start Time: 15:46 Pain Control: Lidocaine 4% T opical Solution Percent of Wound Bed Debrided: 80% T Area Debrided (cm): otal 9.95 Tissue and other material debrided: Viable, Non-Viable, Slough, Subcutaneous, Skin: Dermis , Skin: Epidermis, Slough Level: Skin/Subcutaneous Tissue Debridement Description: Excisional Instrument: Curette Bleeding:  Minimum Hemostasis Achieved: Pressure End Time: 15:52 Procedural Pain: 0 Post Procedural Pain: 0 Response to Treatment: Procedure was tolerated well Level of Consciousness (Post- Awake and Alert procedure): Post Debridement Measurements of Total Wound Length: (cm) 4.8 Width: (cm) 3.3 Depth: (cm) 0.2 Volume: (cm) 2.488 Character of Wound/Ulcer Post Debridement: Improved Post Procedure Diagnosis Same as Pre-procedure Electronic Signature(s) Signed: 02/25/2023 4:43:13 PM By: [name] PA-C Signed: 02/25/2023 5:49:45 PM By: [name] RN, [name] Entered By: [name] on 02/25/2023 12:56:02 -------------------------------------------------------------------------------- HPI Details Patient Name: Date of Service: [name], [name] 02/25/2023 3:15 PM Medical Record Number: [id number] Patient Account Number: [id number] Date of Birth/Sex: Treating RN: 1946/01/30 (77 y.o. M) Primary Care Provider: [name] Other Clinician: Referring Provider: Treating Provider/Extender: [name] Weeks in Treatment: 42 History of Present Illness HPI Description: ADMISSION 02/23/2020 Patient is a 78 year old man who lives in [place] who arrives accompanied by his wife. He has a history of chronic lymphedema and venous insufficiency in his bilateral lower legs which may have something to do that with having a history of DVT as well as being treated for prostate cancer. In  [name] ([id number]) [id number] Page 3 of 17 -------------------------------------------------------------------------------- Debridement Details Patient Name: Date of Service: [name], [name] 02/25/2023 3:15 PM Medical Record Number: [id number] Patient Account Number: [id number] Date of Birth/Sex: Treating RN: 05-13-1946 (77 y.o. [name] Primary Care Provider: [name] Other Clinician: Referring Provider: Treating Provider/Extender: [name] Weeks in Treatment: 48 Debridement Performed for Assessment: Wound #12 Left,Anterior Lower Leg Performed By: Physician [name], PA The following information was scribed by: [name] The information was scribed for: [name] Debridement Type: Debridement Level of Consciousness (Pre-procedure): Awake and Alert Pre-procedure Verification/Time Out Yes - 15:45 Taken: Start Time: 15:46 Pain Control: Lidocaine 4% T opical Solution Percent of Wound Bed Debrided: 100% T Area Debrided (cm): otal 0.52 Tissue and other material debrided: Viable, Non-Viable, Slough, Subcutaneous, Skin: Dermis , Skin: Epidermis, Slough Level: Skin/Subcutaneous Tissue Debridement Description:  Excisional Instrument: Curette Bleeding: Minimum Hemostasis Achieved: Pressure End Time: 15:52 Procedural Pain: 0 Post Procedural Pain: 0 Response to Treatment: Procedure was tolerated well Level of Consciousness (Post- Awake and Alert procedure): Post Debridement Measurements of Total Wound Length: (cm) 1.1 Width: (cm) 0.6 Depth: (cm) 0.1 Volume: (cm) 0.052 Character of Wound/Ulcer Post Debridement: Improved Post Procedure Diagnosis Same as Pre-procedure Electronic Signature(s) Signed: 02/25/2023 4:43:13 PM By: [name] PA-C Signed: 02/25/2023 5:49:45 PM By: [name] RN, [name] Entered By: [name] on 02/25/2023 12:55:30 -------------------------------------------------------------------------------- Debridement Details Patient Name: Date of Service: [name], [name] 02/25/2023 3:15 PM Medical Record Number: [id number] Patient Account Number: [id number] Date of Birth/Sex: Treating RN: [birth year] (77 y.o. [name] Primary Care Provider: [name] Other Clinician: Referring Provider: Treating Provider/Extender: [name] Weeks in Treatment: 48 Debridement Performed for Assessment: Wound #7 Left,Medial Lower Leg Performed By: Physician [name], PA The following information was scribed by: [name] The information was scribed for: [name] Debridement Type: Debridement Llera, [name] ([id number]) [id number] Page 4 of 17 Level of Consciousness (Pre-procedure): Awake and Alert Pre-procedure Verification/Time Out Yes - 15:45 Taken: Start Time: 15:46 Pain Control: Lidocaine 4% T opical Solution Percent of Wound Bed Debrided: 80% T Area Debrided (cm): otal 9.95 Tissue and other material debrided: Viable, Non-Viable, Slough, Subcutaneous, Skin: Dermis , Skin: Epidermis, Slough Level: Skin/Subcutaneous Tissue Debridement Description: Excisional Instrument: Curette Bleeding:  Minimum Hemostasis Achieved: Pressure End Time: 15:52 Procedural Pain: 0 Post Procedural Pain: 0 Response to Treatment: Procedure was tolerated well Level of Consciousness (Post- Awake and Alert procedure): Post Debridement Measurements of Total Wound Length: (cm) 4.8 Width: (cm) 3.3 Depth: (cm) 0.2 Volume: (cm) 2.488 Character of Wound/Ulcer Post Debridement: Improved Post Procedure Diagnosis Same as Pre-procedure Electronic Signature(s) Signed: 02/25/2023 4:43:13 PM By: [name] PA-C Signed: 02/25/2023 5:49:45 PM By: [name] RN, [name] Entered By: [name] on 02/25/2023 12:56:02 -------------------------------------------------------------------------------- HPI Details Patient Name: Date of Service: [name], [name] 02/25/2023 3:15 PM Medical Record Number: [id number] Patient Account Number: [id number] Date of Birth/Sex: Treating RN: 1946/01/30 (77 y.o. M) Primary Care Provider: [name] Other Clinician: Referring Provider: Treating Provider/Extender: [name] Weeks in Treatment: 42 History of Present Illness HPI Description: ADMISSION 02/23/2020 Patient is a 78 year old man who lives in [place] who arrives accompanied by his wife. He has a history of chronic lymphedema and venous insufficiency in his bilateral lower legs which may have something to do that with having a history of DVT as well as being treated for prostate cancer. In  Instructions: Apply Urgo K2 as directed (alternative to 4 layer compression). Wound #7 - Lower Leg Wound Laterality: Left, Medial Cleanser: Soap and Water 1 x Per Week/30 Days Discharge Instructions: May shower and wash wound with dial antibacterial soap and water prior to dressing change. Peri-Wound Care: Sween Lotion (Moisturizing lotion) 1 x Per Week/30 Days Discharge Instructions: Apply moisturizing lotion as directed Prim Dressing: Aquacel Ag 1 x Per Week/30  Days ary Discharge Instructions: apply directly to wound bed. Secondary Dressing: ABD Pad, 8x10 1 x Per Week/30 Days Discharge Instructions: Apply over primary dressing as directed. Secondary Dressing: OptiLock Super Absorbent, 5x5.5 (in/in) (Generic) 1 x Per Week/30 Days Discharge Instructions: Apply directly to wound bed as directed Compression Wrap: Urgo K2, (equivalent to a 4 layer) two layer compression system, regular 1 x Per Week/30 Days Discharge Instructions: Apply Urgo K2 as directed (alternative to 4 layer compression). Morad, [name] ([id number]) [id number] Page 10 of 17 Electronic Signature(s) Signed: 02/25/2023 4:43:13 PM By: [name] PA-C Signed: 02/25/2023 5:49:45 PM By: [name] RN, [name] Entered By: [name] on 02/25/2023 12:57:25 -------------------------------------------------------------------------------- Problem List Details Patient Name: Date of Service: [name], [name] 02/25/2023 3:15 PM Medical Record Number: [id number] Patient Account Number: [id number] Date of Birth/Sex: Treating RN: February 19, 1946 (77 y.o. 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 03/26/2022 No Yes severity L97.818 Non-pressure chronic ulcer of other part of right lower leg with other specified 03/26/2022 No Yes severity L84 Corns and callosities 10/15/2022 No Yes Inactive Problems Resolved Problems Electronic Signature(s) Signed: 02/25/2023 3:36:27 PM By: [name]  PA-C Entered By: [name] on 02/25/2023 12:36:27 -------------------------------------------------------------------------------- Progress Note Details Patient Name: Date of Service: [name], [name] 02/25/2023 3:15 PM Medical Record Number: [id number] Patient Account Number: [id number] [name], [name] ([id number]) [id number].pdf Page 11 of 17 Date of Birth/Sex: Treating RN: February 01, 1946 (77 y.o. M) Primary Care Provider: Other Clinician: [name] Referring Provider: Treating Provider/Extender: Leveda Anna, [name] Weeks in Treatment: 65 Subjective Chief Complaint Information obtained from Patient 02/23/2020; patient is here for wounds on his bilateral lower legs in the setting of severe lymphedema 03/26/2022; patient is here for wounds on his bilateral lower legs medial aspect History of Present Illness (HPI) ADMISSION 02/23/2020 Patient is a 77 year old man who lives in [place] who arrives accompanied by his wife. He has a history of chronic lymphedema and venous insufficiency in his bilateral lower legs which may have something to do that with having a history of DVT as well as being treated for prostate cancer. In any case he recently got compression pumps at home but compliance has been an issue here. He has compression stockings however they are probably not sufficient enough to control swelling. They tell us that things deteriorated for him in late August he was admitted to [facility] for 7 days. This was with cellulitis I think of his bilateral lower legs. Discharge he was noted to have wounds on his bilateral lower legs. He was discharged on Bactrim. They tried to get him home health through [facility] part C of course they declined him. His wife is  am going to do. 02-18-23 upon evaluation today patient appears to be doing pretty well currently compared to last week this is actually better today. He is less weeping and the legs are actually significantly smaller. He tells me has been trying to stay in bed with his legs elevated more. 02-25-2023 upon evaluation today patient appears to be doing well currently in regard to his legs in general compared to where we have been. Fortunately I do not see any signs of active infection locally or systemically which is great news and in general I do believe that we are making good headway here towards complete closure. I am hopeful that he will continue to show signs of improvement as  we progress going forward. He does seem to have been taking his fluid pills as well as elevating his legs and using lymphedema pumps on a regular basis. He is trying to stay out of the hospital. Electronic Signature(s) Signed: 02/25/2023 4:48:56 PM By: [name] PA-C Entered By: [name] on 02/25/2023 13:48:56 -------------------------------------------------------------------------------- Physical Exam Details Patient Name: Date of Service: [name], [name] 02/25/2023 3:15 PM Medical Record Number: [id number] Patient Account Number: [id number] Date of Birth/Sex: Treating RN: [birth year] (77 y.o. M) Primary Care Provider: [name] Other Clinician: Referring Provider: Treating Provider/Extender: [name] Weeks in Treatment: 44 Constitutional Well-nourished and well-hydrated in no acute distress. Martorano, [name] ([id number]) [id number] Page 8 of 17 Respiratory normal breathing without difficulty. Psychiatric this patient is able to make decisions and demonstrates good insight into disease process. Alert and Oriented x 3. pleasant and cooperative. Notes Upon inspection patient's wound bed actually showed signs of good granulation epithelization at this point. Fortunately I do not see but worsening overall. I believe that the patient is making good headway any evidence towards closure and I am very pleased in that regard. There are no signs of infection in general at this point. Electronic Signature(s) Signed: 02/25/2023 4:49:10 PM By: [name] PA-C Entered By: [name] on 02/25/2023 13:49:09 -------------------------------------------------------------------------------- Physician Orders Details Patient Name: Date of Service: [name], [name] 02/25/2023 3:15 PM Medical Record Number: [id number] Patient Account Number: [id number] Date of Birth/Sex: Treating RN: 06/18/1945 (77 y.o. [name], [name] Primary Care Provider:  [name] Other Clinician: Referring Provider: Treating Provider/Extender: [name] Weeks in Treatment: 30 The following information was scribed by: [name] The information was scribed for: [name] Verbal / Phone Orders: No Diagnosis Coding ICD-10 Coding Code Description [phone] Chronic venous hypertension (idiopathic) with ulcer and inflammation of bilateral lower extremity I89.0 Lymphedema, not elsewhere classified 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.818 Non-pressure chronic ulcer of other part of right lower leg with other specified severity L84 Corns and callosities Follow-up Appointments ppointment in 1 week. [name] PA Wednesday ****extra time 60 minutes**** 03/04/2023 3pm Return A Provider recommends you go to the Hospital as soon as possible- related to excessive fluid overload. ppointment in 2 weeks. [name] PA (already scheduled) 03/11/2023 3pm Return A Return appointment in 3 weeks. - 03/18/2023 3pm Adolph Clutter (already scheduled) Return appointment in 1 month. - Please see front desk to schedule appointment Anesthetic (In clinic) Topical Lidocaine 5% applied to wound bed Bathing/ Shower/ Hygiene May shower with protection but do not get wound dressing(s) wet. Protect dressing(s) with water repellant cover (for example, large plastic bag) or a cast cover and may then take shower.  am going to do. 02-18-23 upon evaluation today patient appears to be doing pretty well currently compared to last week this is actually better today. He is less weeping and the legs are actually significantly smaller. He tells me has been trying to stay in bed with his legs elevated more. 02-25-2023 upon evaluation today patient appears to be doing well currently in regard to his legs in general compared to where we have been. Fortunately I do not see any signs of active infection locally or systemically which is great news and in general I do believe that we are making good headway here towards complete closure. I am hopeful that he will continue to show signs of improvement as  we progress going forward. He does seem to have been taking his fluid pills as well as elevating his legs and using lymphedema pumps on a regular basis. He is trying to stay out of the hospital. Electronic Signature(s) Signed: 02/25/2023 4:48:56 PM By: [name] PA-C Entered By: [name] on 02/25/2023 13:48:56 -------------------------------------------------------------------------------- Physical Exam Details Patient Name: Date of Service: [name], [name] 02/25/2023 3:15 PM Medical Record Number: [id number] Patient Account Number: [id number] Date of Birth/Sex: Treating RN: [birth year] (77 y.o. M) Primary Care Provider: [name] Other Clinician: Referring Provider: Treating Provider/Extender: [name] Weeks in Treatment: 44 Constitutional Well-nourished and well-hydrated in no acute distress. Martorano, [name] ([id number]) [id number] Page 8 of 17 Respiratory normal breathing without difficulty. Psychiatric this patient is able to make decisions and demonstrates good insight into disease process. Alert and Oriented x 3. pleasant and cooperative. Notes Upon inspection patient's wound bed actually showed signs of good granulation epithelization at this point. Fortunately I do not see but worsening overall. I believe that the patient is making good headway any evidence towards closure and I am very pleased in that regard. There are no signs of infection in general at this point. Electronic Signature(s) Signed: 02/25/2023 4:49:10 PM By: [name] PA-C Entered By: [name] on 02/25/2023 13:49:09 -------------------------------------------------------------------------------- Physician Orders Details Patient Name: Date of Service: [name], [name] 02/25/2023 3:15 PM Medical Record Number: [id number] Patient Account Number: [id number] Date of Birth/Sex: Treating RN: 06/18/1945 (77 y.o. [name], [name] Primary Care Provider:  [name] Other Clinician: Referring Provider: Treating Provider/Extender: [name] Weeks in Treatment: 30 The following information was scribed by: [name] The information was scribed for: [name] Verbal / Phone Orders: No Diagnosis Coding ICD-10 Coding Code Description [phone] Chronic venous hypertension (idiopathic) with ulcer and inflammation of bilateral lower extremity I89.0 Lymphedema, not elsewhere classified 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.818 Non-pressure chronic ulcer of other part of right lower leg with other specified severity L84 Corns and callosities Follow-up Appointments ppointment in 1 week. [name] PA Wednesday ****extra time 60 minutes**** 03/04/2023 3pm Return A Provider recommends you go to the Hospital as soon as possible- related to excessive fluid overload. ppointment in 2 weeks. [name] PA (already scheduled) 03/11/2023 3pm Return A Return appointment in 3 weeks. - 03/18/2023 3pm Adolph Clutter (already scheduled) Return appointment in 1 month. - Please see front desk to schedule appointment Anesthetic (In clinic) Topical Lidocaine 5% applied to wound bed Bathing/ Shower/ Hygiene May shower with protection but do not get wound dressing(s) wet. Protect dressing(s) with water repellant cover (for example, large plastic bag) or a cast cover and may then take shower.  [name], [name] ([id number]) [id number] Page 1 of 17 Visit Report for 02/25/2023 Chief Complaint Document Details Patient Name: Date of Service: [name], [name] 02/25/2023 3:15 PM Medical Record Number: [id number] Patient Account Number: [id number] Date of Birth/Sex: Treating RN: [birth year] (77 y.o. M) Primary Care Provider: [name] Other Clinician: Referring Provider: Treating Provider/Extender: [name] Weeks in Treatment: 17 Information Obtained from: Patient Chief Complaint 02/23/2020; patient is here for wounds on his bilateral lower legs in the setting of severe lymphedema 03/26/2022; patient is here for wounds on his bilateral lower legs medial aspect Electronic Signature(s) Signed: 02/25/2023 3:36:51 PM By: [name] PA-C Entered By: [name] on 02/25/2023 12:36:50 -------------------------------------------------------------------------------- Debridement Details Patient Name: Date of Service: [name], [name] 02/25/2023 3:15 PM Medical Record Number: [id number] Patient Account Number: [id number] Date of Birth/Sex: Treating RN: [birth year] (77 y.o. [name], Millard.Loa Primary Care Provider: [name] Other Clinician: Referring Provider: Treating Provider/Extender: [name] Weeks in Treatment: 48 Debridement Performed for Assessment: Wound #6 Right,Medial Lower Leg Performed By: Physician [name], PA The following information was scribed by: [name] The information was scribed for: [name] Debridement Type: Debridement Level of Consciousness (Pre-procedure): Awake and Alert Pre-procedure Verification/Time Out Yes - 15:45 Taken: Start Time: 15:46 Pain Control: Lidocaine 4% T opical Solution Percent of Wound Bed Debrided: 80% T Area Debrided (cm): otal 23.46 Tissue and other material debrided: Viable, Non-Viable, Slough, Subcutaneous, Skin: Dermis , Skin:  Epidermis, Slough Level: Skin/Subcutaneous Tissue Debridement Description: Excisional Instrument: Curette Bleeding: Minimum Hemostasis Achieved: Pressure End Time: 15:52 Procedural Pain: 0 Post Procedural Pain: 0 Response to Treatment: Procedure was tolerated well Level of Consciousness (Post- Awake and Alert procedure): Post Debridement Measurements of Total Wound Length: (cm) 8.3 Width: (cm) 4.5 Depth: (cm) 0.5 [name], [name] ([id number]) [id number] Page 2 of 17 Volume: (cm) 14.667 Character of Wound/Ulcer Post Debridement: Improved Post Procedure Diagnosis Same as Pre-procedure Electronic Signature(s) Signed: 02/25/2023 4:43:13 PM By: [name] PA-C Signed: 02/25/2023 5:49:45 PM By: [name] RN, [name] Entered By: [name] on 02/25/2023 12:52:34 -------------------------------------------------------------------------------- Debridement Details Patient Name: Date of Service: [name], [name] 02/25/2023 3:15 PM Medical Record Number: [id number] Patient Account Number: [id number] Date of Birth/Sex: Treating RN: [birth year] (77 y.o. [name], Millard.Loa Primary Care Provider: [name] Other Clinician: Referring Provider: Treating Provider/Extender: [name] Weeks in Treatment: 48 Debridement Performed for Assessment: Wound #6 Right,Medial Lower Leg Performed By: Physician [name], PA The following information was scribed by: [name] The information was scribed for: [name] Debridement Type: Debridement Level of Consciousness (Pre-procedure): Awake and Alert Pre-procedure Verification/Time Out Yes - 15:45 Taken: Start Time: 15:46 Pain Control: Lidocaine 4% T opical Solution Percent of Wound Bed Debrided: 80% T Area Debrided (cm): otal 24.02 Tissue and other material debrided: Viable, Non-Viable, Slough, Subcutaneous, Skin: Dermis , Skin: Epidermis, Slough Level: Skin/Subcutaneous  Tissue Debridement Description: Excisional Instrument: Curette Bleeding: Minimum Hemostasis Achieved: Pressure End Time: 15:52 Procedural Pain: 0 Post Procedural Pain: 0 Response to Treatment: Procedure was tolerated well Level of Consciousness (Post- Awake and Alert procedure): Post Debridement Measurements of Total Wound Length: (cm) 8.5 Width: (cm) 4.5 Depth: (cm) 0.5 Volume: (cm) 15.021 Character of Wound/Ulcer Post Debridement: Improved Post Procedure Diagnosis Same as Pre-procedure Electronic Signature(s) Signed: 02/25/2023 4:43:13 PM By: [name] PA-C Signed: 02/25/2023 5:49:45 PM By: [name] RN, [name] Entered By: [name] on 02/25/2023 12:54:45 [name],  [name] ([id number]) [id number] Page 3 of 17 -------------------------------------------------------------------------------- Debridement Details Patient Name: Date of Service: [name], [name] 02/25/2023 3:15 PM Medical Record Number: [id number] Patient Account Number: [id number] Date of Birth/Sex: Treating RN: 05-13-1946 (77 y.o. [name] Primary Care Provider: [name] Other Clinician: Referring Provider: Treating Provider/Extender: [name] Weeks in Treatment: 48 Debridement Performed for Assessment: Wound #12 Left,Anterior Lower Leg Performed By: Physician [name], PA The following information was scribed by: [name] The information was scribed for: [name] Debridement Type: Debridement Level of Consciousness (Pre-procedure): Awake and Alert Pre-procedure Verification/Time Out Yes - 15:45 Taken: Start Time: 15:46 Pain Control: Lidocaine 4% T opical Solution Percent of Wound Bed Debrided: 100% T Area Debrided (cm): otal 0.52 Tissue and other material debrided: Viable, Non-Viable, Slough, Subcutaneous, Skin: Dermis , Skin: Epidermis, Slough Level: Skin/Subcutaneous Tissue Debridement Description:  Excisional Instrument: Curette Bleeding: Minimum Hemostasis Achieved: Pressure End Time: 15:52 Procedural Pain: 0 Post Procedural Pain: 0 Response to Treatment: Procedure was tolerated well Level of Consciousness (Post- Awake and Alert procedure): Post Debridement Measurements of Total Wound Length: (cm) 1.1 Width: (cm) 0.6 Depth: (cm) 0.1 Volume: (cm) 0.052 Character of Wound/Ulcer Post Debridement: Improved Post Procedure Diagnosis Same as Pre-procedure Electronic Signature(s) Signed: 02/25/2023 4:43:13 PM By: [name] PA-C Signed: 02/25/2023 5:49:45 PM By: [name] RN, [name] Entered By: [name] on 02/25/2023 12:55:30 -------------------------------------------------------------------------------- Debridement Details Patient Name: Date of Service: [name], [name] 02/25/2023 3:15 PM Medical Record Number: [id number] Patient Account Number: [id number] Date of Birth/Sex: Treating RN: [birth year] (77 y.o. [name] Primary Care Provider: [name] Other Clinician: Referring Provider: Treating Provider/Extender: [name] Weeks in Treatment: 48 Debridement Performed for Assessment: Wound #7 Left,Medial Lower Leg Performed By: Physician [name], PA The following information was scribed by: [name] The information was scribed for: [name] Debridement Type: Debridement Llera, [name] ([id number]) [id number] Page 4 of 17 Level of Consciousness (Pre-procedure): Awake and Alert Pre-procedure Verification/Time Out Yes - 15:45 Taken: Start Time: 15:46 Pain Control: Lidocaine 4% T opical Solution Percent of Wound Bed Debrided: 80% T Area Debrided (cm): otal 9.95 Tissue and other material debrided: Viable, Non-Viable, Slough, Subcutaneous, Skin: Dermis , Skin: Epidermis, Slough Level: Skin/Subcutaneous Tissue Debridement Description: Excisional Instrument: Curette Bleeding:  Minimum Hemostasis Achieved: Pressure End Time: 15:52 Procedural Pain: 0 Post Procedural Pain: 0 Response to Treatment: Procedure was tolerated well Level of Consciousness (Post- Awake and Alert procedure): Post Debridement Measurements of Total Wound Length: (cm) 4.8 Width: (cm) 3.3 Depth: (cm) 0.2 Volume: (cm) 2.488 Character of Wound/Ulcer Post Debridement: Improved Post Procedure Diagnosis Same as Pre-procedure Electronic Signature(s) Signed: 02/25/2023 4:43:13 PM By: [name] PA-C Signed: 02/25/2023 5:49:45 PM By: [name] RN, [name] Entered By: [name] on 02/25/2023 12:56:02 -------------------------------------------------------------------------------- HPI Details Patient Name: Date of Service: [name], [name] 02/25/2023 3:15 PM Medical Record Number: [id number] Patient Account Number: [id number] Date of Birth/Sex: Treating RN: 1946/01/30 (77 y.o. M) Primary Care Provider: [name] Other Clinician: Referring Provider: Treating Provider/Extender: [name] Weeks in Treatment: 42 History of Present Illness HPI Description: ADMISSION 02/23/2020 Patient is a 78 year old man who lives in [place] who arrives accompanied by his wife. He has a history of chronic lymphedema and venous insufficiency in his bilateral lower legs which may have something to do that with having a history of DVT as well as being treated for prostate cancer. In  am going to do. 02-18-23 upon evaluation today patient appears to be doing pretty well currently compared to last week this is actually better today. He is less weeping and the legs are actually significantly smaller. He tells me has been trying to stay in bed with his legs elevated more. 02-25-2023 upon evaluation today patient appears to be doing well currently in regard to his legs in general compared to where we have been. Fortunately I do not see any signs of active infection locally or systemically which is great news and in general I do believe that we are making good headway here towards complete closure. I am hopeful that he will continue to show signs of improvement as  we progress going forward. He does seem to have been taking his fluid pills as well as elevating his legs and using lymphedema pumps on a regular basis. He is trying to stay out of the hospital. Electronic Signature(s) Signed: 02/25/2023 4:48:56 PM By: [name] PA-C Entered By: [name] on 02/25/2023 13:48:56 -------------------------------------------------------------------------------- Physical Exam Details Patient Name: Date of Service: [name], [name] 02/25/2023 3:15 PM Medical Record Number: [id number] Patient Account Number: [id number] Date of Birth/Sex: Treating RN: [birth year] (77 y.o. M) Primary Care Provider: [name] Other Clinician: Referring Provider: Treating Provider/Extender: [name] Weeks in Treatment: 44 Constitutional Well-nourished and well-hydrated in no acute distress. Martorano, [name] ([id number]) [id number] Page 8 of 17 Respiratory normal breathing without difficulty. Psychiatric this patient is able to make decisions and demonstrates good insight into disease process. Alert and Oriented x 3. pleasant and cooperative. Notes Upon inspection patient's wound bed actually showed signs of good granulation epithelization at this point. Fortunately I do not see but worsening overall. I believe that the patient is making good headway any evidence towards closure and I am very pleased in that regard. There are no signs of infection in general at this point. Electronic Signature(s) Signed: 02/25/2023 4:49:10 PM By: [name] PA-C Entered By: [name] on 02/25/2023 13:49:09 -------------------------------------------------------------------------------- Physician Orders Details Patient Name: Date of Service: [name], [name] 02/25/2023 3:15 PM Medical Record Number: [id number] Patient Account Number: [id number] Date of Birth/Sex: Treating RN: 06/18/1945 (77 y.o. [name], [name] Primary Care Provider:  [name] Other Clinician: Referring Provider: Treating Provider/Extender: [name] Weeks in Treatment: 30 The following information was scribed by: [name] The information was scribed for: [name] Verbal / Phone Orders: No Diagnosis Coding ICD-10 Coding Code Description [phone] Chronic venous hypertension (idiopathic) with ulcer and inflammation of bilateral lower extremity I89.0 Lymphedema, not elsewhere classified 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.818 Non-pressure chronic ulcer of other part of right lower leg with other specified severity L84 Corns and callosities Follow-up Appointments ppointment in 1 week. [name] PA Wednesday ****extra time 60 minutes**** 03/04/2023 3pm Return A Provider recommends you go to the Hospital as soon as possible- related to excessive fluid overload. ppointment in 2 weeks. [name] PA (already scheduled) 03/11/2023 3pm Return A Return appointment in 3 weeks. - 03/18/2023 3pm Adolph Clutter (already scheduled) Return appointment in 1 month. - Please see front desk to schedule appointment Anesthetic (In clinic) Topical Lidocaine 5% applied to wound bed Bathing/ Shower/ Hygiene May shower with protection but do not get wound dressing(s) wet. Protect dressing(s) with water repellant cover (for example, large plastic bag) or a cast cover and may then take shower.  Wound Laterality: Left,  Medial Cleanser: Soap and Water 1 x Per Week/30 Days Discharge Instructions: May shower and wash wound with dial antibacterial soap and water prior to dressing change. Peri-Wound Care: Sween Lotion (Moisturizing lotion) 1 x Per Week/30 Days Discharge Instructions: Apply moisturizing lotion as directed Prim Dressing: Aquacel Ag 1 x Per Week/30 Days ary Discharge Instructions: apply directly to wound bed. Secondary Dressing: ABD Pad, 8x10 1 x Per Week/30 Days Discharge Instructions: Apply over primary dressing as directed. Secondary Dressing: OptiLock Super Absorbent, 5x5.5 (in/in) (Generic) 1 x Per Week/30 Days Discharge Instructions: Apply directly to wound bed as directed Com pression Wrap: Urgo K2, (equivalent to a 4 layer) two layer compression system, regular 1 x Per Week/30 Days Discharge Instructions: Apply Urgo K2 as directed (alternative to 4 layer compression). 1. I would recommend that we have the patient continue to monitor for any signs of infection or worsening. Based on what I see I do feel like that he is doing better I think he is actually taking it hard to try to elevate his legs, take his fluid pills, and use lymphedema pumps. He wants to stay out of the hospital. 2. I would recommend as well that he continue to use the Aquacel Ag followed by the ABD pad and then the OPTi lock. 3. Will continue with Urgo K2 compression wraps which she should keep in place as well. We will see patient back for reevaluation in 1 week here in the clinic. If anything worsens or changes patient will contact our office for additional recommendations. Electronic Signature(s) Signed: 02/25/2023 4:49:37 PM By: [name] PA-C Entered By: [name] on 02/25/2023 13:49:36 Brash, [name] ([id number]) [id number] Page 17 of 17 -------------------------------------------------------------------------------- SuperBill Details Patient Name: Date of Service: Shelton, [name]  02/25/2023 Medical Record Number: [id number] Patient Account Number: [id number] Date of Birth/Sex: Treating RN: Dec 29, 1945 (77 y.o. [name], [name] Primary Care Provider: [name] Other Clinician: Referring Provider: Treating Provider/Extender: [name] Weeks in Treatment: 48 Diagnosis Coding ICD-10 Codes Code Description [phone] Chronic venous hypertension (idiopathic) with ulcer and inflammation of bilateral lower extremity I89.0 Lymphedema, not elsewhere classified 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.818 Non-pressure chronic ulcer of other part of right lower leg with other specified severity L84 Corns and callosities Facility Procedures : CPT4 Code: [id number] Description: 11042 - DEB SUBQ TISSUE 20 SQ CM/< ICD-10 Diagnosis Description L97.828 Non-pressure chronic ulcer of other part of left lower leg with other specified L97.818 Non-pressure chronic ulcer of other part of right lower leg with other specified Modifier: severity severity Quantity: 1 : CPT4 Code: [id number] Description: 11045 - DEB SUBQ TISS EA ADDL 20CM ICD-10 Diagnosis Description L97.818 Non-pressure chronic ulcer of other part of right lower leg with other specified L97.828 Non-pressure chronic ulcer of other part of left lower leg with other specified Modifier: severity severity Quantity: 2 Physician Procedures : CPT4 Code Description Modifier [id number] 11042 - WC PHYS SUBQ TISS 20 SQ CM ICD-10 Diagnosis Description 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 Quantity: 1 : [id number] 11045 - WC PHYS SUBQ TISS EA ADDL 20 CM ICD-10 Diagnosis Description L97.818 Non-pressure chronic ulcer of other part of right lower leg with other specified severity L97.828 Non-pressure chronic  ulcer of other part of left lower leg  [name], [name] ([id number]) [id number] Page 1 of 17 Visit Report for 02/25/2023 Chief Complaint Document Details Patient Name: Date of Service: [name], [name] 02/25/2023 3:15 PM Medical Record Number: [id number] Patient Account Number: [id number] Date of Birth/Sex: Treating RN: [birth year] (77 y.o. M) Primary Care Provider: [name] Other Clinician: Referring Provider: Treating Provider/Extender: [name] Weeks in Treatment: 17 Information Obtained from: Patient Chief Complaint 02/23/2020; patient is here for wounds on his bilateral lower legs in the setting of severe lymphedema 03/26/2022; patient is here for wounds on his bilateral lower legs medial aspect Electronic Signature(s) Signed: 02/25/2023 3:36:51 PM By: [name] PA-C Entered By: [name] on 02/25/2023 12:36:50 -------------------------------------------------------------------------------- Debridement Details Patient Name: Date of Service: [name], [name] 02/25/2023 3:15 PM Medical Record Number: [id number] Patient Account Number: [id number] Date of Birth/Sex: Treating RN: [birth year] (77 y.o. [name], Millard.Loa Primary Care Provider: [name] Other Clinician: Referring Provider: Treating Provider/Extender: [name] Weeks in Treatment: 48 Debridement Performed for Assessment: Wound #6 Right,Medial Lower Leg Performed By: Physician [name], PA The following information was scribed by: [name] The information was scribed for: [name] Debridement Type: Debridement Level of Consciousness (Pre-procedure): Awake and Alert Pre-procedure Verification/Time Out Yes - 15:45 Taken: Start Time: 15:46 Pain Control: Lidocaine 4% T opical Solution Percent of Wound Bed Debrided: 80% T Area Debrided (cm): otal 23.46 Tissue and other material debrided: Viable, Non-Viable, Slough, Subcutaneous, Skin: Dermis , Skin:  Epidermis, Slough Level: Skin/Subcutaneous Tissue Debridement Description: Excisional Instrument: Curette Bleeding: Minimum Hemostasis Achieved: Pressure End Time: 15:52 Procedural Pain: 0 Post Procedural Pain: 0 Response to Treatment: Procedure was tolerated well Level of Consciousness (Post- Awake and Alert procedure): Post Debridement Measurements of Total Wound Length: (cm) 8.3 Width: (cm) 4.5 Depth: (cm) 0.5 [name], [name] ([id number]) [id number] Page 2 of 17 Volume: (cm) 14.667 Character of Wound/Ulcer Post Debridement: Improved Post Procedure Diagnosis Same as Pre-procedure Electronic Signature(s) Signed: 02/25/2023 4:43:13 PM By: [name] PA-C Signed: 02/25/2023 5:49:45 PM By: [name] RN, [name] Entered By: [name] on 02/25/2023 12:52:34 -------------------------------------------------------------------------------- Debridement Details Patient Name: Date of Service: [name], [name] 02/25/2023 3:15 PM Medical Record Number: [id number] Patient Account Number: [id number] Date of Birth/Sex: Treating RN: [birth year] (77 y.o. [name], Millard.Loa Primary Care Provider: [name] Other Clinician: Referring Provider: Treating Provider/Extender: [name] Weeks in Treatment: 48 Debridement Performed for Assessment: Wound #6 Right,Medial Lower Leg Performed By: Physician [name], PA The following information was scribed by: [name] The information was scribed for: [name] Debridement Type: Debridement Level of Consciousness (Pre-procedure): Awake and Alert Pre-procedure Verification/Time Out Yes - 15:45 Taken: Start Time: 15:46 Pain Control: Lidocaine 4% T opical Solution Percent of Wound Bed Debrided: 80% T Area Debrided (cm): otal 24.02 Tissue and other material debrided: Viable, Non-Viable, Slough, Subcutaneous, Skin: Dermis , Skin: Epidermis, Slough Level: Skin/Subcutaneous  Tissue Debridement Description: Excisional Instrument: Curette Bleeding: Minimum Hemostasis Achieved: Pressure End Time: 15:52 Procedural Pain: 0 Post Procedural Pain: 0 Response to Treatment: Procedure was tolerated well Level of Consciousness (Post- Awake and Alert procedure): Post Debridement Measurements of Total Wound Length: (cm) 8.5 Width: (cm) 4.5 Depth: (cm) 0.5 Volume: (cm) 15.021 Character of Wound/Ulcer Post Debridement: Improved Post Procedure Diagnosis Same as Pre-procedure Electronic Signature(s) Signed: 02/25/2023 4:43:13 PM By: [name] PA-C Signed: 02/25/2023 5:49:45 PM By: [name] RN, [name] Entered By: [name] on 02/25/2023 12:54:45 [name],  Instructions: Apply Urgo K2 as directed (alternative to 4 layer compression). Wound #7 - Lower Leg Wound Laterality: Left, Medial Cleanser: Soap and Water 1 x Per Week/30 Days Discharge Instructions: May shower and wash wound with dial antibacterial soap and water prior to dressing change. Peri-Wound Care: Sween Lotion (Moisturizing lotion) 1 x Per Week/30 Days Discharge Instructions: Apply moisturizing lotion as directed Prim Dressing: Aquacel Ag 1 x Per Week/30  Days ary Discharge Instructions: apply directly to wound bed. Secondary Dressing: ABD Pad, 8x10 1 x Per Week/30 Days Discharge Instructions: Apply over primary dressing as directed. Secondary Dressing: OptiLock Super Absorbent, 5x5.5 (in/in) (Generic) 1 x Per Week/30 Days Discharge Instructions: Apply directly to wound bed as directed Compression Wrap: Urgo K2, (equivalent to a 4 layer) two layer compression system, regular 1 x Per Week/30 Days Discharge Instructions: Apply Urgo K2 as directed (alternative to 4 layer compression). Morad, [name] ([id number]) [id number] Page 10 of 17 Electronic Signature(s) Signed: 02/25/2023 4:43:13 PM By: [name] PA-C Signed: 02/25/2023 5:49:45 PM By: [name] RN, [name] Entered By: [name] on 02/25/2023 12:57:25 -------------------------------------------------------------------------------- Problem List Details Patient Name: Date of Service: [name], [name] 02/25/2023 3:15 PM Medical Record Number: [id number] Patient Account Number: [id number] Date of Birth/Sex: Treating RN: February 19, 1946 (77 y.o. 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 03/26/2022 No Yes severity L97.818 Non-pressure chronic ulcer of other part of right lower leg with other specified 03/26/2022 No Yes severity L84 Corns and callosities 10/15/2022 No Yes Inactive Problems Resolved Problems Electronic Signature(s) Signed: 02/25/2023 3:36:27 PM By: [name]  PA-C Entered By: [name] on 02/25/2023 12:36:27 -------------------------------------------------------------------------------- Progress Note Details Patient Name: Date of Service: [name], [name] 02/25/2023 3:15 PM Medical Record Number: [id number] Patient Account Number: [id number] [name], [name] ([id number]) [id number].pdf Page 11 of 17 Date of Birth/Sex: Treating RN: February 01, 1946 (77 y.o. M) Primary Care Provider: Other Clinician: [name] Referring Provider: Treating Provider/Extender: Leveda Anna, [name] Weeks in Treatment: 65 Subjective Chief Complaint Information obtained from Patient 02/23/2020; patient is here for wounds on his bilateral lower legs in the setting of severe lymphedema 03/26/2022; patient is here for wounds on his bilateral lower legs medial aspect History of Present Illness (HPI) ADMISSION 02/23/2020 Patient is a 77 year old man who lives in [place] who arrives accompanied by his wife. He has a history of chronic lymphedema and venous insufficiency in his bilateral lower legs which may have something to do that with having a history of DVT as well as being treated for prostate cancer. In any case he recently got compression pumps at home but compliance has been an issue here. He has compression stockings however they are probably not sufficient enough to control swelling. They tell us that things deteriorated for him in late August he was admitted to [facility] for 7 days. This was with cellulitis I think of his bilateral lower legs. Discharge he was noted to have wounds on his bilateral lower legs. He was discharged on Bactrim. They tried to get him home health through [facility] part C of course they declined him. His wife is  Instructions: Apply Urgo K2 as directed (alternative to 4 layer compression). Wound #7 - Lower Leg Wound Laterality: Left, Medial Cleanser: Soap and Water 1 x Per Week/30 Days Discharge Instructions: May shower and wash wound with dial antibacterial soap and water prior to dressing change. Peri-Wound Care: Sween Lotion (Moisturizing lotion) 1 x Per Week/30 Days Discharge Instructions: Apply moisturizing lotion as directed Prim Dressing: Aquacel Ag 1 x Per Week/30  Days ary Discharge Instructions: apply directly to wound bed. Secondary Dressing: ABD Pad, 8x10 1 x Per Week/30 Days Discharge Instructions: Apply over primary dressing as directed. Secondary Dressing: OptiLock Super Absorbent, 5x5.5 (in/in) (Generic) 1 x Per Week/30 Days Discharge Instructions: Apply directly to wound bed as directed Compression Wrap: Urgo K2, (equivalent to a 4 layer) two layer compression system, regular 1 x Per Week/30 Days Discharge Instructions: Apply Urgo K2 as directed (alternative to 4 layer compression). Morad, [name] ([id number]) [id number] Page 10 of 17 Electronic Signature(s) Signed: 02/25/2023 4:43:13 PM By: [name] PA-C Signed: 02/25/2023 5:49:45 PM By: [name] RN, [name] Entered By: [name] on 02/25/2023 12:57:25 -------------------------------------------------------------------------------- Problem List Details Patient Name: Date of Service: [name], [name] 02/25/2023 3:15 PM Medical Record Number: [id number] Patient Account Number: [id number] Date of Birth/Sex: Treating RN: February 19, 1946 (77 y.o. 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 03/26/2022 No Yes severity L97.818 Non-pressure chronic ulcer of other part of right lower leg with other specified 03/26/2022 No Yes severity L84 Corns and callosities 10/15/2022 No Yes Inactive Problems Resolved Problems Electronic Signature(s) Signed: 02/25/2023 3:36:27 PM By: [name]  PA-C Entered By: [name] on 02/25/2023 12:36:27 -------------------------------------------------------------------------------- Progress Note Details Patient Name: Date of Service: [name], [name] 02/25/2023 3:15 PM Medical Record Number: [id number] Patient Account Number: [id number] [name], [name] ([id number]) [id number].pdf Page 11 of 17 Date of Birth/Sex: Treating RN: February 01, 1946 (77 y.o. M) Primary Care Provider: Other Clinician: [name] Referring Provider: Treating Provider/Extender: Leveda Anna, [name] Weeks in Treatment: 65 Subjective Chief Complaint Information obtained from Patient 02/23/2020; patient is here for wounds on his bilateral lower legs in the setting of severe lymphedema 03/26/2022; patient is here for wounds on his bilateral lower legs medial aspect History of Present Illness (HPI) ADMISSION 02/23/2020 Patient is a 77 year old man who lives in [place] who arrives accompanied by his wife. He has a history of chronic lymphedema and venous insufficiency in his bilateral lower legs which may have something to do that with having a history of DVT as well as being treated for prostate cancer. In any case he recently got compression pumps at home but compliance has been an issue here. He has compression stockings however they are probably not sufficient enough to control swelling. They tell us that things deteriorated for him in late August he was admitted to [facility] for 7 days. This was with cellulitis I think of his bilateral lower legs. Discharge he was noted to have wounds on his bilateral lower legs. He was discharged on Bactrim. They tried to get him home health through [facility] part C of course they declined him. His wife is  [name] ([id number]) [id number] Page 3 of 17 -------------------------------------------------------------------------------- Debridement Details Patient Name: Date of Service: [name], [name] 02/25/2023 3:15 PM Medical Record Number: [id number] Patient Account Number: [id number] Date of Birth/Sex: Treating RN: 05-13-1946 (77 y.o. [name] Primary Care Provider: [name] Other Clinician: Referring Provider: Treating Provider/Extender: [name] Weeks in Treatment: 48 Debridement Performed for Assessment: Wound #12 Left,Anterior Lower Leg Performed By: Physician [name], PA The following information was scribed by: [name] The information was scribed for: [name] Debridement Type: Debridement Level of Consciousness (Pre-procedure): Awake and Alert Pre-procedure Verification/Time Out Yes - 15:45 Taken: Start Time: 15:46 Pain Control: Lidocaine 4% T opical Solution Percent of Wound Bed Debrided: 100% T Area Debrided (cm): otal 0.52 Tissue and other material debrided: Viable, Non-Viable, Slough, Subcutaneous, Skin: Dermis , Skin: Epidermis, Slough Level: Skin/Subcutaneous Tissue Debridement Description:  Excisional Instrument: Curette Bleeding: Minimum Hemostasis Achieved: Pressure End Time: 15:52 Procedural Pain: 0 Post Procedural Pain: 0 Response to Treatment: Procedure was tolerated well Level of Consciousness (Post- Awake and Alert procedure): Post Debridement Measurements of Total Wound Length: (cm) 1.1 Width: (cm) 0.6 Depth: (cm) 0.1 Volume: (cm) 0.052 Character of Wound/Ulcer Post Debridement: Improved Post Procedure Diagnosis Same as Pre-procedure Electronic Signature(s) Signed: 02/25/2023 4:43:13 PM By: [name] PA-C Signed: 02/25/2023 5:49:45 PM By: [name] RN, [name] Entered By: [name] on 02/25/2023 12:55:30 -------------------------------------------------------------------------------- Debridement Details Patient Name: Date of Service: [name], [name] 02/25/2023 3:15 PM Medical Record Number: [id number] Patient Account Number: [id number] Date of Birth/Sex: Treating RN: [birth year] (77 y.o. [name] Primary Care Provider: [name] Other Clinician: Referring Provider: Treating Provider/Extender: [name] Weeks in Treatment: 48 Debridement Performed for Assessment: Wound #7 Left,Medial Lower Leg Performed By: Physician [name], PA The following information was scribed by: [name] The information was scribed for: [name] Debridement Type: Debridement Llera, [name] ([id number]) [id number] Page 4 of 17 Level of Consciousness (Pre-procedure): Awake and Alert Pre-procedure Verification/Time Out Yes - 15:45 Taken: Start Time: 15:46 Pain Control: Lidocaine 4% T opical Solution Percent of Wound Bed Debrided: 80% T Area Debrided (cm): otal 9.95 Tissue and other material debrided: Viable, Non-Viable, Slough, Subcutaneous, Skin: Dermis , Skin: Epidermis, Slough Level: Skin/Subcutaneous Tissue Debridement Description: Excisional Instrument: Curette Bleeding:  Minimum Hemostasis Achieved: Pressure End Time: 15:52 Procedural Pain: 0 Post Procedural Pain: 0 Response to Treatment: Procedure was tolerated well Level of Consciousness (Post- Awake and Alert procedure): Post Debridement Measurements of Total Wound Length: (cm) 4.8 Width: (cm) 3.3 Depth: (cm) 0.2 Volume: (cm) 2.488 Character of Wound/Ulcer Post Debridement: Improved Post Procedure Diagnosis Same as Pre-procedure Electronic Signature(s) Signed: 02/25/2023 4:43:13 PM By: [name] PA-C Signed: 02/25/2023 5:49:45 PM By: [name] RN, [name] Entered By: [name] on 02/25/2023 12:56:02 -------------------------------------------------------------------------------- HPI Details Patient Name: Date of Service: [name], [name] 02/25/2023 3:15 PM Medical Record Number: [id number] Patient Account Number: [id number] Date of Birth/Sex: Treating RN: 1946/01/30 (77 y.o. M) Primary Care Provider: [name] Other Clinician: Referring Provider: Treating Provider/Extender: [name] Weeks in Treatment: 42 History of Present Illness HPI Description: ADMISSION 02/23/2020 Patient is a 78 year old man who lives in [place] who arrives accompanied by his wife. He has a history of chronic lymphedema and venous insufficiency in his bilateral lower legs which may have something to do that with having a history of DVT as well as being treated for prostate cancer. In  [name], [name] ([id number]) [id number] Page 1 of 17 Visit Report for 02/25/2023 Chief Complaint Document Details Patient Name: Date of Service: [name], [name] 02/25/2023 3:15 PM Medical Record Number: [id number] Patient Account Number: [id number] Date of Birth/Sex: Treating RN: [birth year] (77 y.o. M) Primary Care Provider: [name] Other Clinician: Referring Provider: Treating Provider/Extender: [name] Weeks in Treatment: 17 Information Obtained from: Patient Chief Complaint 02/23/2020; patient is here for wounds on his bilateral lower legs in the setting of severe lymphedema 03/26/2022; patient is here for wounds on his bilateral lower legs medial aspect Electronic Signature(s) Signed: 02/25/2023 3:36:51 PM By: [name] PA-C Entered By: [name] on 02/25/2023 12:36:50 -------------------------------------------------------------------------------- Debridement Details Patient Name: Date of Service: [name], [name] 02/25/2023 3:15 PM Medical Record Number: [id number] Patient Account Number: [id number] Date of Birth/Sex: Treating RN: [birth year] (77 y.o. [name], Millard.Loa Primary Care Provider: [name] Other Clinician: Referring Provider: Treating Provider/Extender: [name] Weeks in Treatment: 48 Debridement Performed for Assessment: Wound #6 Right,Medial Lower Leg Performed By: Physician [name], PA The following information was scribed by: [name] The information was scribed for: [name] Debridement Type: Debridement Level of Consciousness (Pre-procedure): Awake and Alert Pre-procedure Verification/Time Out Yes - 15:45 Taken: Start Time: 15:46 Pain Control: Lidocaine 4% T opical Solution Percent of Wound Bed Debrided: 80% T Area Debrided (cm): otal 23.46 Tissue and other material debrided: Viable, Non-Viable, Slough, Subcutaneous, Skin: Dermis , Skin:  Epidermis, Slough Level: Skin/Subcutaneous Tissue Debridement Description: Excisional Instrument: Curette Bleeding: Minimum Hemostasis Achieved: Pressure End Time: 15:52 Procedural Pain: 0 Post Procedural Pain: 0 Response to Treatment: Procedure was tolerated well Level of Consciousness (Post- Awake and Alert procedure): Post Debridement Measurements of Total Wound Length: (cm) 8.3 Width: (cm) 4.5 Depth: (cm) 0.5 [name], [name] ([id number]) [id number] Page 2 of 17 Volume: (cm) 14.667 Character of Wound/Ulcer Post Debridement: Improved Post Procedure Diagnosis Same as Pre-procedure Electronic Signature(s) Signed: 02/25/2023 4:43:13 PM By: [name] PA-C Signed: 02/25/2023 5:49:45 PM By: [name] RN, [name] Entered By: [name] on 02/25/2023 12:52:34 -------------------------------------------------------------------------------- Debridement Details Patient Name: Date of Service: [name], [name] 02/25/2023 3:15 PM Medical Record Number: [id number] Patient Account Number: [id number] Date of Birth/Sex: Treating RN: [birth year] (77 y.o. [name], Millard.Loa Primary Care Provider: [name] Other Clinician: Referring Provider: Treating Provider/Extender: [name] Weeks in Treatment: 48 Debridement Performed for Assessment: Wound #6 Right,Medial Lower Leg Performed By: Physician [name], PA The following information was scribed by: [name] The information was scribed for: [name] Debridement Type: Debridement Level of Consciousness (Pre-procedure): Awake and Alert Pre-procedure Verification/Time Out Yes - 15:45 Taken: Start Time: 15:46 Pain Control: Lidocaine 4% T opical Solution Percent of Wound Bed Debrided: 80% T Area Debrided (cm): otal 24.02 Tissue and other material debrided: Viable, Non-Viable, Slough, Subcutaneous, Skin: Dermis , Skin: Epidermis, Slough Level: Skin/Subcutaneous  Tissue Debridement Description: Excisional Instrument: Curette Bleeding: Minimum Hemostasis Achieved: Pressure End Time: 15:52 Procedural Pain: 0 Post Procedural Pain: 0 Response to Treatment: Procedure was tolerated well Level of Consciousness (Post- Awake and Alert procedure): Post Debridement Measurements of Total Wound Length: (cm) 8.5 Width: (cm) 4.5 Depth: (cm) 0.5 Volume: (cm) 15.021 Character of Wound/Ulcer Post Debridement: Improved Post Procedure Diagnosis Same as Pre-procedure Electronic Signature(s) Signed: 02/25/2023 4:43:13 PM By: [name] PA-C Signed: 02/25/2023 5:49:45 PM By: [name] RN, [name] Entered By: [name] on 02/25/2023 12:54:45 [name],

## 2023-02-26 NOTE — Progress Notes (Signed)
Noted: No [name], [name] ([id number]) [id number] Page 10 of 14 Dry / Scaly: No Maceration: No Treatment Notes Wound #15 (Lower Leg) Wound Laterality: Left, Posterior, Distal Cleanser Soap and Water Discharge Instruction: May shower and wash wound with dial antibacterial soap and water prior to dressing change. Peri-Wound Care Sween Lotion (Moisturizing lotion) Discharge Instruction: Apply moisturizing lotion as directed Topical Primary Dressing Aquacel Ag Discharge  Instruction: apply directly to wound bed. Secondary Dressing ABD Pad, 8x10 Discharge Instruction: Apply over primary dressing as directed. OptiLock Super Absorbent, 5x5.5 (in/in) Discharge Instruction: Apply directly to wound bed as directed Secured With Compression Wrap Urgo K2, (equivalent to a 4 layer) two layer compression system, regular Discharge Instruction: Apply Urgo K2 as directed (alternative to 4 layer compression). Compression Stockings Add-Ons Electronic Signature(s) Signed: 02/25/2023 5:03:01 PM By: [name] Schwalbe RN Previous Signature: 02/25/2023 3:41:06 PM Version By: [name] Entered By: [name] on 02/25/2023 12:52:18 -------------------------------------------------------------------------------- Wound Assessment Details Patient Name: Date of Service: [name], [name] 02/25/2023 3:15 PM Medical Record Number: [id number] Patient Account Number: [id number] Date of Birth/Sex: Treating RN: Mar 12, 1946 (77 y.o. M) Primary Care [name]: [name] Other Clinician: Referring [name]: Treating [name]: [name], [name] Weeks in Treatment: 48 Wound Status Wound Number: 6 Primary Lymphedema Etiology: Wound Location: Right, Medial Lower Leg Wound Open Wounding Event: Gradually Appeared Status: Date Acquired: 03/05/2022 Comorbid Anemia, Lymphedema, Deep Vein Thrombosis, Hypertension, Weeks Of Treatment: 48 History: Received Radiation Clustered Wound: Yes Photos [name] ([id number]) [id number] Page 11 of 14 Wound Measurements Length: (cm) 8.3 Width: (cm) 4.5 Depth: (cm) 0.5 Clustered Quantity: 1 Area: (cm) 29.335 Volume: (cm) 14.667 % Reduction in Area: 79.5% % Reduction in Volume: -2.6% Epithelialization: Small (1-33%) Tunneling: No Undermining: No Wound Description Classification: Full Thickness Without Exposed Sup Wound Margin: Distinct, outline attached Exudate Amount:  Large Exudate Type: Serous Exudate Color: amber port Structures Foul Odor After Cleansing: No Slough/Fibrino Yes Wound Bed Granulation Amount: None Present (0%) Exposed Structure Necrotic Amount: Large (67-100%) Fascia Exposed: No Necrotic Quality: Adherent Slough Fat Layer (Subcutaneous Tissue) Exposed: Yes Tendon Exposed: No Muscle Exposed: No Joint Exposed: No Bone Exposed: No Periwound Skin Texture Texture Color No Abnormalities Noted: No No Abnormalities Noted: No Callus: No Atrophie Blanche: No Crepitus: No Cyanosis: No Excoriation: No Ecchymosis: No Induration: No Erythema: No Rash: No Hemosiderin Staining: Yes Scarring: Yes Mottled: No Pallor: No Moisture Rubor: No No Abnormalities Noted: No Dry / Scaly: No Temperature / Pain Maceration: No Temperature: No Abnormality Treatment Notes Wound #6 (Lower Leg) Wound Laterality: Right, Medial Cleanser Soap and Water Discharge Instruction: May shower and wash wound with dial antibacterial soap and water prior to dressing change. Peri-Wound Care Sween Lotion (Moisturizing lotion) Discharge Instruction: Apply moisturizing lotion as directed Topical Primary Dressing Aquacel Ag Discharge Instruction: apply directly to wound bed. Secondary Dressing ABD Pad, 8x10 Discharge Instruction: Apply over primary dressing as directed. OptiLock Super Absorbent, 5x5.5 (in/in) Discharge Instruction: Apply directly to wound bed as directed [name], [name] ([id number]) [id number] Page 12 of 14 Secured With Compression Wrap Urgo K2, (equivalent to a 4 layer) two layer compression system, regular Discharge Instruction: Apply Urgo K2 as directed (alternative to 4 layer compression). Compression Stockings Add-Ons Electronic Signature(s) Signed: 02/25/2023 5:03:01 PM By: [name] Schwalbe RN Previous Signature: 02/25/2023 3:41:06 PM Version By: [name] Entered By: [name] on 02/25/2023  12:52:35 -------------------------------------------------------------------------------- Wound Assessment Details Patient Name: Date of Service: [name], [name] 02/25/2023 3:15 PM Medical Record Number: [id number] Patient Account Number:  [id number] Date of Birth/Sex: Treating RN: September 16, 1945 (77 y.o. M) Primary Care [name]: [name] Other Clinician: Referring [name]: Treating [name]/Extender: [name], [name] Weeks in Treatment: 48 Wound Status Wound Number: 7 Primary Lymphedema Etiology: Wound Location: Left, Medial Lower Leg Wound Open Wounding Event: Gradually Appeared Status: Date Acquired: 03/05/2022 Comorbid Anemia, Lymphedema, Deep Vein Thrombosis, Hypertension, Weeks Of Treatment: 48 History: Received Radiation Clustered Wound: Yes Photos Wound Measurements Length: (cm) Width: (cm) Depth: (cm) Clustered Quantity: Area: (cm) Volume: (cm) 4.8 % Reduction in Area: 73.6% 3.3 % Reduction in Volume: 47.2% 0.2 Epithelialization: Small (1-33%) 1 Tunneling: No 12.441 Undermining: No 2.488 Wound Description Classification: Full Thickness Without Exposed Sup Wound Margin: Distinct, outline attached Exudate Amount: Large Exudate Type: Serous Exudate Color: amber port Structures Foul Odor After Cleansing: No Slough/Fibrino Yes Wound Bed Granulation Amount: Small (1-33%) Exposed Structure Granulation Quality: Red Fascia Exposed: No Necrotic Amount: Large (67-100%) Fat Layer (Subcutaneous Tissue) Exposed: Yes [name] ([id number]) [id number] Page 13 of 14 Necrotic Quality: Adherent Slough Tendon Exposed: No Muscle Exposed: No Joint Exposed: No Bone Exposed: No Periwound Skin Texture Texture Color No Abnormalities Noted: No No Abnormalities Noted: No Callus: No Atrophie Blanche: No Crepitus: No Cyanosis: No Excoriation: No Ecchymosis: No Induration: No Erythema: No Rash: No Hemosiderin  Staining: Yes Scarring: Yes Mottled: No Pallor: No Moisture Rubor: No No Abnormalities Noted: No Dry / Scaly: No Temperature / Pain Maceration: No Temperature: No Abnormality Treatment Notes Wound #7 (Lower Leg) Wound Laterality: Left, Medial Cleanser Soap and Water Discharge Instruction: May shower and wash wound with dial antibacterial soap and water prior to dressing change. Peri-Wound Care Sween Lotion (Moisturizing lotion) Discharge Instruction: Apply moisturizing lotion as directed Topical Primary Dressing Aquacel Ag Discharge Instruction: apply directly to wound bed. Secondary Dressing ABD Pad, 8x10 Discharge Instruction: Apply over primary dressing as directed. OptiLock Super Absorbent, 5x5.5 (in/in) Discharge Instruction: Apply directly to wound bed as directed Secured With Compression Wrap Urgo K2, (equivalent to a 4 layer) two layer compression system, regular Discharge Instruction: Apply Urgo K2 as directed (alternative to 4 layer compression). Compression Stockings Add-Ons Electronic Signature(s) Signed: 02/25/2023 5:03:01 PM By: [name] Schwalbe RN Previous Signature: 02/25/2023 3:41:06 PM Version By: [name] Entered By: [name] on 02/25/2023 12:52:50 -------------------------------------------------------------------------------- Vitals Details Patient Name: Date of Service: [name], [name] 02/25/2023 3:15 PM Medical Record Number: [id number] Patient Account Number: [id number] Date of Birth/Sex: Treating RN: January 04, 1946 (77 y.o. M) Primary Care Ashden Sonnenberg: [name] Other Clinician: Referring Clarise Chacko: Treating Letina Luckett/Extender: [name] in Treatment: 48 Kisiel, [name] ([id number]) [id number] Page 14 of 14 Vital Signs Time Taken: 15:40 Temperature (F): 98.2 Height (in): 74 Pulse (bpm): 84 Weight (lbs): 250 Respiratory Rate (breaths/min): 18 Body Mass Index (BMI):  32.1 Blood Pressure (mmHg): 165/61 Reference Range: 80 - 120 mg / dl Electronic Signature(s) Signed: 02/25/2023 3:41:06 PM By: [name] Entered By: [name] on 02/25/2023 12:40:41  Pain Assessment Details Patient Name: Date of Service: [name], [name] 02/25/2023 3:15 PM Medical Record Number: [id number] Patient Account Number:  [id number] Date of Birth/Sex: Treating RN: 1945-08-04 (77 y.o. M) Primary Care Marycarmen Hagey: [name] Other Clinician: Referring Adelae Yodice: Treating Tayloranne Lekas/Extender: [name] Weeks in Treatment: 37 Active Problems Location of Pain Severity and Description of Pain Patient Has Paino No Site Locations Pain Management and Medication Current Pain Management: Electronic Signature(s) Signed: 02/25/2023 3:41:06 PM By: [name] Entered By: [name] on 02/25/2023 12:40:47 -------------------------------------------------------------------------------- Patient/Caregiver Education Details Patient Name: Date of Service: [name], [name] 10/9/2024andnbsp3:15 PM Medical Record Number: [id number] Patient Account Number: [id number] Date of Birth/Gender: Treating RN: 1945/08/05 (77 y.o. [name] Primary Care Physician: [name] Other Clinician: Referring Physician: Treating Physician/Extender: [name] in Treatment: 42 Education Assessment Education Provided To: Patient Education Topics Provided Wound/Skin Impairment: Handouts: Caring for Your Ulcer Methods: Explain/Verbal Responses: Reinforcements needed Aman, Carden ([id number]) [id number] Page 6 of 14 Electronic Signature(s) Signed: 02/25/2023 5:49:45 PM By: [name] RN, [name] Entered By: [name] on 02/25/2023 12:23:13 -------------------------------------------------------------------------------- Wound Assessment Details Patient Name: Date of Service: [name], [name] 02/25/2023 3:15 PM Medical Record Number: [id number] Patient Account Number: [id number] Date of Birth/Sex: Treating RN: 06-12-1945 (77 y.o. M) Primary Care Dellia Donnelly: [name] Other Clinician: Referring Agape Hardiman: Treating Adiva Boettner/Extender: [name], [name] Weeks in Treatment: 48 Wound Status Wound Number: 12 Primary  Lymphedema Etiology: Wound Location: Left, Anterior Lower Leg Wound Open Wounding Event: Frostbite Status: Date Acquired: 12/17/2022 Comorbid Anemia, Lymphedema, Deep Vein Thrombosis, Hypertension, Weeks Of Treatment: 10 History: Received Radiation Clustered Wound: No Photos Wound Measurements Length: (cm) 1.1 Width: (cm) 0.6 Depth: (cm) 0.1 Area: (cm) 0.518 Volume: (cm) 0.052 % Reduction in Area: 85.3% % Reduction in Volume: 92.6% Epithelialization: Small (1-33%) Tunneling: No Undermining: No Wound Description Classification: Full Thickness Without Exposed Suppor Wound Margin: Distinct, outline attached Exudate Amount: Large Exudate Type: Serous Exudate Color: amber t Structures Foul Odor After Cleansing: No Slough/Fibrino Yes Wound Bed Granulation Amount: Large (67-100%) Exposed Structure Granulation Quality: Red, Pink Fascia Exposed: No Necrotic Amount: Small (1-33%) Fat Layer (Subcutaneous Tissue) Exposed: Yes Necrotic Quality: Adherent Slough Tendon Exposed: No Muscle Exposed: No Joint Exposed: No Bone Exposed: No Periwound Skin Texture Texture Color No Abnormalities Noted: No No Abnormalities Noted: No Callus: No Atrophie Blanche: No Crepitus: No Cyanosis: No [name], [name] ([id number]) [id number] Page 7 of 14 Excoriation: No Ecchymosis: No Induration: No Erythema: No Rash: No Hemosiderin Staining: Yes Scarring: No Mottled: No Pallor: No Moisture Rubor: No No Abnormalities Noted: No Dry / Scaly: No Temperature / Pain Maceration: No Temperature: No Abnormality Treatment Notes Wound #12 (Lower Leg) Wound Laterality: Left, Anterior Cleanser Soap and Water Discharge Instruction: May shower and wash wound with dial antibacterial soap and water prior to dressing change. Peri-Wound Care Sween Lotion (Moisturizing lotion) Discharge Instruction: Apply moisturizing lotion as directed Topical Primary Dressing Aquacel  Ag Discharge Instruction: apply directly to wound bed. Secondary Dressing ABD Pad, 8x10 Discharge Instruction: Apply over primary dressing as directed. OptiLock Super Absorbent, 5x5.5 (in/in) Discharge Instruction: Apply directly to wound bed as directed Secured With Compression Wrap Urgo K2, (equivalent to a 4 layer) two layer compression system, regular Discharge Instruction: Apply Urgo K2 as directed (alternative to 4 layer compression). Compression Stockings Add-Ons Electronic Signature(s) Signed: 02/25/2023 5:03:01 PM By: [name] Schwalbe RN Previous Signature: 02/25/2023 3:44:45 PM Version By: Brenton Grills Previous  Signature: 02/25/2023 3:41:06 PM Version By: [name] Entered By: [name] on 02/25/2023 12:52:02 -------------------------------------------------------------------------------- Wound Assessment Details Patient Name: Date of Service: [name], [name] 02/25/2023 3:15 PM Medical Record Number: [id number] Patient Account Number: [id number] Date of Birth/Sex: Treating RN: 01-02-1946 (77 y.o. [name] Primary Care Sacheen Arrasmith: [name] Other Clinician: Referring Haruye Lainez: Treating Stephenia Vogan/Extender: [name] Weeks in Treatment: 48 Wound Status Wound Number: 14 Primary Lymphedema Etiology: Wound Location: Right, Anterior Lower Leg Wound Healed - Epithelialized Wounding Event: Gradually Appeared Status: Date Acquired: 02/05/2023 Comorbid Anemia, Lymphedema, Deep Vein Thrombosis, Hypertension, Weeks Of Treatment: 2 History: Received Radiation Clustered Wound: Yes Photos [name], [name] ([id number]) [id number] Page 8 of 14 Wound Measurements Length: (cm) Width: (cm) Depth: (cm) Clustered Quantity: Area: (cm) Volume: (cm) 0 % Reduction in Area: 100% 0 % Reduction in Volume: 100% 0 Epithelialization: Large (67-100%) 2 Tunneling: No 0 Undermining: No 0 Wound  Description Classification: Partial Thickness Exudate Amount: Medium Exudate Type: Serosanguineous Exudate Color: red, brown Foul Odor After Cleansing: No Slough/Fibrino Yes Wound Bed Granulation Amount: None Present (0%) Exposed Structure Necrotic Amount: None Present (0%) Fascia Exposed: No Fat Layer (Subcutaneous Tissue) Exposed: Yes Tendon Exposed: No Muscle Exposed: No Joint Exposed: No Bone Exposed: No Periwound Skin Texture Texture Color No Abnormalities Noted: No No Abnormalities Noted: No Callus: No Atrophie Blanche: No Crepitus: No Cyanosis: No Excoriation: No Ecchymosis: No Induration: No Erythema: No Rash: No Hemosiderin Staining: No Scarring: No Mottled: No Pallor: No Moisture Rubor: No No Abnormalities Noted: No Dry / Scaly: No Temperature / Pain Maceration: No Temperature: No Abnormality Treatment Notes Wound #14 (Lower Leg) Wound Laterality: Right, Anterior Cleanser Peri-Wound Care Topical Primary Dressing Secondary Dressing Secured With Compression Wrap Compression Stockings Add-Ons Electronic Signature(s) Signed: 02/25/2023 5:49:45 PM By: [name] RN, [name] Previous Signature: 02/25/2023 3:45:20 PM Version By: [name] Schwalbe RN Previous Signature: 02/25/2023 3:41:06 PM Version By: [name] Entered By: [name] on 02/25/2023 12:50:38 [name], [name] ([id number]) [id number] Page 9 of 14 -------------------------------------------------------------------------------- Wound Assessment Details Patient Name: Date of Service: [name], [name] 02/25/2023 3:15 PM Medical Record Number: [id number] Patient Account Number: [id number] Date of Birth/Sex: Treating RN: May 12, 1946 (77 y.o. M) Primary Care Guled Gahan: [name] Other Clinician: Referring Lukasz Rogus: Treating Gyanna Jarema/Extender: [name], [name] Weeks in Treatment: 48 Wound Status Wound Number: 15 Primary  Lymphedema Etiology: Wound Location: Left, Distal, Posterior Lower Leg Wound Open Wounding Event: Gradually Appeared Status: Date Acquired: 01/14/2023 Comorbid Anemia, Lymphedema, Deep Vein Thrombosis, Hypertension, Weeks Of Treatment: 1 History: Received Radiation Clustered Wound: No Photos Wound Measurements Length: (cm) 1 Width: (cm) 2 Depth: (cm) 0 Area: (cm) Volume: (cm) .8 % Reduction in Area: 68% % Reduction in Volume: 68% .1 Epithelialization: Small (1-33%) 2.827 Tunneling: No 0.283 Undermining: No Wound Description Classification: Full Thickness Without Exposed Suppor Wound Margin: Distinct, outline attached Exudate Amount: Medium Exudate Type: Serosanguineous Exudate Color: red, brown t Structures Foul Odor After Cleansing: No Slough/Fibrino Yes Wound Bed Granulation Amount: Medium (34-66%) Exposed Structure Granulation Quality: Red Fascia Exposed: No Necrotic Amount: Medium (34-66%) Fat Layer (Subcutaneous Tissue) Exposed: Yes Necrotic Quality: Adherent Slough Tendon Exposed: No Muscle Exposed: No Joint Exposed: No Bone Exposed: No Periwound Skin Texture Texture Color No Abnormalities Noted: No No Abnormalities Noted: No Callus: No Atrophie Blanche: No Crepitus: No Cyanosis: No Excoriation: No Ecchymosis: No Induration: No Erythema: No Rash: No Hemosiderin Staining: Yes Scarring: No Mottled: No Pallor: No Moisture Rubor: No No Abnormalities  Noted: No [name], [name] ([id number]) [id number] Page 10 of 14 Dry / Scaly: No Maceration: No Treatment Notes Wound #15 (Lower Leg) Wound Laterality: Left, Posterior, Distal Cleanser Soap and Water Discharge Instruction: May shower and wash wound with dial antibacterial soap and water prior to dressing change. Peri-Wound Care Sween Lotion (Moisturizing lotion) Discharge Instruction: Apply moisturizing lotion as directed Topical Primary Dressing Aquacel Ag Discharge  Instruction: apply directly to wound bed. Secondary Dressing ABD Pad, 8x10 Discharge Instruction: Apply over primary dressing as directed. OptiLock Super Absorbent, 5x5.5 (in/in) Discharge Instruction: Apply directly to wound bed as directed Secured With Compression Wrap Urgo K2, (equivalent to a 4 layer) two layer compression system, regular Discharge Instruction: Apply Urgo K2 as directed (alternative to 4 layer compression). Compression Stockings Add-Ons Electronic Signature(s) Signed: 02/25/2023 5:03:01 PM By: [name] Schwalbe RN Previous Signature: 02/25/2023 3:41:06 PM Version By: [name] Entered By: [name] on 02/25/2023 12:52:18 -------------------------------------------------------------------------------- Wound Assessment Details Patient Name: Date of Service: [name], [name] 02/25/2023 3:15 PM Medical Record Number: [id number] Patient Account Number: [id number] Date of Birth/Sex: Treating RN: Mar 12, 1946 (77 y.o. M) Primary Care [name]: [name] Other Clinician: Referring [name]: Treating [name]: [name], [name] Weeks in Treatment: 48 Wound Status Wound Number: 6 Primary Lymphedema Etiology: Wound Location: Right, Medial Lower Leg Wound Open Wounding Event: Gradually Appeared Status: Date Acquired: 03/05/2022 Comorbid Anemia, Lymphedema, Deep Vein Thrombosis, Hypertension, Weeks Of Treatment: 48 History: Received Radiation Clustered Wound: Yes Photos [name] ([id number]) [id number] Page 11 of 14 Wound Measurements Length: (cm) 8.3 Width: (cm) 4.5 Depth: (cm) 0.5 Clustered Quantity: 1 Area: (cm) 29.335 Volume: (cm) 14.667 % Reduction in Area: 79.5% % Reduction in Volume: -2.6% Epithelialization: Small (1-33%) Tunneling: No Undermining: No Wound Description Classification: Full Thickness Without Exposed Sup Wound Margin: Distinct, outline attached Exudate Amount:  Large Exudate Type: Serous Exudate Color: amber port Structures Foul Odor After Cleansing: No Slough/Fibrino Yes Wound Bed Granulation Amount: None Present (0%) Exposed Structure Necrotic Amount: Large (67-100%) Fascia Exposed: No Necrotic Quality: Adherent Slough Fat Layer (Subcutaneous Tissue) Exposed: Yes Tendon Exposed: No Muscle Exposed: No Joint Exposed: No Bone Exposed: No Periwound Skin Texture Texture Color No Abnormalities Noted: No No Abnormalities Noted: No Callus: No Atrophie Blanche: No Crepitus: No Cyanosis: No Excoriation: No Ecchymosis: No Induration: No Erythema: No Rash: No Hemosiderin Staining: Yes Scarring: Yes Mottled: No Pallor: No Moisture Rubor: No No Abnormalities Noted: No Dry / Scaly: No Temperature / Pain Maceration: No Temperature: No Abnormality Treatment Notes Wound #6 (Lower Leg) Wound Laterality: Right, Medial Cleanser Soap and Water Discharge Instruction: May shower and wash wound with dial antibacterial soap and water prior to dressing change. Peri-Wound Care Sween Lotion (Moisturizing lotion) Discharge Instruction: Apply moisturizing lotion as directed Topical Primary Dressing Aquacel Ag Discharge Instruction: apply directly to wound bed. Secondary Dressing ABD Pad, 8x10 Discharge Instruction: Apply over primary dressing as directed. OptiLock Super Absorbent, 5x5.5 (in/in) Discharge Instruction: Apply directly to wound bed as directed [name], [name] ([id number]) [id number] Page 12 of 14 Secured With Compression Wrap Urgo K2, (equivalent to a 4 layer) two layer compression system, regular Discharge Instruction: Apply Urgo K2 as directed (alternative to 4 layer compression). Compression Stockings Add-Ons Electronic Signature(s) Signed: 02/25/2023 5:03:01 PM By: [name] Schwalbe RN Previous Signature: 02/25/2023 3:41:06 PM Version By: [name] Entered By: [name] on 02/25/2023  12:52:35 -------------------------------------------------------------------------------- Wound Assessment Details Patient Name: Date of Service: [name], [name] 02/25/2023 3:15 PM Medical Record Number: [id number] Patient Account Number:  Pain Assessment Details Patient Name: Date of Service: [name], [name] 02/25/2023 3:15 PM Medical Record Number: [id number] Patient Account Number:  [id number] Date of Birth/Sex: Treating RN: 1945-08-04 (77 y.o. M) Primary Care Marycarmen Hagey: [name] Other Clinician: Referring Adelae Yodice: Treating Tayloranne Lekas/Extender: [name] Weeks in Treatment: 37 Active Problems Location of Pain Severity and Description of Pain Patient Has Paino No Site Locations Pain Management and Medication Current Pain Management: Electronic Signature(s) Signed: 02/25/2023 3:41:06 PM By: [name] Entered By: [name] on 02/25/2023 12:40:47 -------------------------------------------------------------------------------- Patient/Caregiver Education Details Patient Name: Date of Service: [name], [name] 10/9/2024andnbsp3:15 PM Medical Record Number: [id number] Patient Account Number: [id number] Date of Birth/Gender: Treating RN: 1945/08/05 (77 y.o. [name] Primary Care Physician: [name] Other Clinician: Referring Physician: Treating Physician/Extender: [name] in Treatment: 42 Education Assessment Education Provided To: Patient Education Topics Provided Wound/Skin Impairment: Handouts: Caring for Your Ulcer Methods: Explain/Verbal Responses: Reinforcements needed Aman, Carden ([id number]) [id number] Page 6 of 14 Electronic Signature(s) Signed: 02/25/2023 5:49:45 PM By: [name] RN, [name] Entered By: [name] on 02/25/2023 12:23:13 -------------------------------------------------------------------------------- Wound Assessment Details Patient Name: Date of Service: [name], [name] 02/25/2023 3:15 PM Medical Record Number: [id number] Patient Account Number: [id number] Date of Birth/Sex: Treating RN: 06-12-1945 (77 y.o. M) Primary Care Dellia Donnelly: [name] Other Clinician: Referring Agape Hardiman: Treating Adiva Boettner/Extender: [name], [name] Weeks in Treatment: 48 Wound Status Wound Number: 12 Primary  Lymphedema Etiology: Wound Location: Left, Anterior Lower Leg Wound Open Wounding Event: Frostbite Status: Date Acquired: 12/17/2022 Comorbid Anemia, Lymphedema, Deep Vein Thrombosis, Hypertension, Weeks Of Treatment: 10 History: Received Radiation Clustered Wound: No Photos Wound Measurements Length: (cm) 1.1 Width: (cm) 0.6 Depth: (cm) 0.1 Area: (cm) 0.518 Volume: (cm) 0.052 % Reduction in Area: 85.3% % Reduction in Volume: 92.6% Epithelialization: Small (1-33%) Tunneling: No Undermining: No Wound Description Classification: Full Thickness Without Exposed Suppor Wound Margin: Distinct, outline attached Exudate Amount: Large Exudate Type: Serous Exudate Color: amber t Structures Foul Odor After Cleansing: No Slough/Fibrino Yes Wound Bed Granulation Amount: Large (67-100%) Exposed Structure Granulation Quality: Red, Pink Fascia Exposed: No Necrotic Amount: Small (1-33%) Fat Layer (Subcutaneous Tissue) Exposed: Yes Necrotic Quality: Adherent Slough Tendon Exposed: No Muscle Exposed: No Joint Exposed: No Bone Exposed: No Periwound Skin Texture Texture Color No Abnormalities Noted: No No Abnormalities Noted: No Callus: No Atrophie Blanche: No Crepitus: No Cyanosis: No [name], [name] ([id number]) [id number].pdf Page 7 of 14 Excoriation: No Ecchymosis: No Induration: No Erythema: No Rash: No Hemosiderin Staining: Yes Scarring: No Mottled: No Pallor: No Moisture Rubor: No No Abnormalities Noted: No Dry / Scaly: No Temperature / Pain Maceration: No Temperature: No Abnormality Treatment Notes Wound #12 (Lower Leg) Wound Laterality: Left, Anterior Cleanser Soap and Water Discharge Instruction: May shower and wash wound with dial antibacterial soap and water prior to dressing change. Peri-Wound Care Sween Lotion (Moisturizing lotion) Discharge Instruction: Apply moisturizing lotion as directed Topical Primary Dressing Aquacel  Ag Discharge Instruction: apply directly to wound bed. Secondary Dressing ABD Pad, 8x10 Discharge Instruction: Apply over primary dressing as directed. OptiLock Super Absorbent, 5x5.5 (in/in) Discharge Instruction: Apply directly to wound bed as directed Secured With Compression Wrap Urgo K2, (equivalent to a 4 layer) two layer compression system, regular Discharge Instruction: Apply Urgo K2 as directed (alternative to 4 layer compression). Compression Stockings Add-Ons Electronic Signature(s) Signed: 02/25/2023 5:03:01 PM By: [name] Schwalbe RN Previous Signature: 02/25/2023 3:44:45 PM Version By: Brenton Grills Previous

## 2023-03-02 NOTE — Progress Notes (Signed)
Clinician [name], RN Compression Type: Double Layer Post Procedure Diagnosis Same as Pre-procedure Electronic Signature(s) Signed: 02/18/2023 5:53:00 PM By: [name] RN, [name] Eke, [name] ([id number]) PM By: [name] RN, [name] [phone].pdf Page 3 of 12 Signed: 02/18/2023 5:53:00 Entered By: [name] on 02/18/2023 12:21:06 -------------------------------------------------------------------------------- Compression Therapy Details Patient Name: Date of Service: [name], [name] 02/18/2023 2:00 PM Medical Record Number: [id number] Patient Account Number: [id number] Date of Birth/Sex: Treating RN: [birth year] (77 y.o. [name] Primary Care Hebe Merriwether: [name] Other Clinician: Referring Jerline Linzy: Treating Dimitry Holsworth/Extender: [name] Weeks in Treatment: 105 Compression Therapy Performed for Wound Assessment: Wound #7 Left,Medial Lower Leg Performed By: Clinician [name], RN Compression Type: Double Layer Post Procedure Diagnosis Same as Pre-procedure Electronic Signature(s) Signed: 02/18/2023 5:53:00 PM By: [name] RN, [name] Entered By: [name] on 02/18/2023 12:21:07 -------------------------------------------------------------------------------- Encounter Discharge Information Details Patient Name: Date of Service: [name], [name] 02/18/2023 2:00 PM Medical Record Number: [id number] Patient Account Number: [id number] Date of Birth/Sex: Treating RN: 06-25-1945 (77 y.o. [name] Primary Care Terrin Imparato: [name] Other Clinician: Referring Massiah Longanecker: Treating Jaxtyn Linville/Extender: [name] in Treatment: 10 Encounter Discharge Information Items Discharge Condition: Stable Ambulatory Status: Cane Discharge Destination: Home Transportation: Private Auto Accompanied By: self Schedule Follow-up Appointment: Yes Clinical Summary of Care: Electronic Signature(s) Signed: 02/18/2023 5:53:00 PM By: [name] RN, [name] Entered By: [name] on 02/18/2023 12:33:30 -------------------------------------------------------------------------------- Lower Extremity Assessment Details Patient Name: Date of Service: [name], [name] 02/18/2023 2:00 PM Medical Record Number: [id number] Patient Account Number: [id number] Date of Birth/Sex: Treating RN: 01/02/1946 (77 y.o. M) Primary Care Abron Neddo: [name] Other Clinician: Gar[place], [name] ([id number]) [id number] Page 4 of 12 Referring Carolle Ishii: Treating Lacresha Fusilier/Extender: Leveda Anna, [name] Weeks in Treatment: 47 Edema Assessment Assessed: [Left: No] [Right: No] Edema: [Left: Yes] [Right: Yes] Calf Left: Right: Point of Measurement: 40 cm From Medial Instep 63 cm 55 cm Ankle Left: Right: Point of Measurement: 13 cm From Medial Instep 39.5 cm 40 cm Vascular Assessment Extremity colors, hair growth, and conditions: Extremity Color: [Left:Hyperpigmented] [Right:Hyperpigmented] Hair Growth on Extremity: [Left:No] Temperature of Extremity: [Left:Warm]  [Right:Warm] Capillary Refill: [Left:> 3 seconds] [Right:> 3 seconds] Dependent Rubor: [Left:No Yes] [Right:No Yes] Toe Nail Assessment Left: Right: Thick: Yes Yes Discolored: Yes Yes Deformed: Yes Yes Improper Length and Hygiene: Yes Yes Electronic Signature(s) Signed: 02/19/2023 3:10:19 PM By: [name] RN Signed: 02/19/2023 3:52:59 PM By: Thayer Dallas Entered By: Thayer Dallas on 02/18/2023 12:00:24 -------------------------------------------------------------------------------- Multi-Disciplinary Care Plan Details Patient Name: Date of Service: [name], [name] 02/18/2023 2:00 PM Medical Record Number: [id number] Patient Account Number: [id number] Date of Birth/Sex: Treating RN: 28-Jul-1945 (77 y.o. [name] Primary Care Zoriana Oats: [name] Other Clinician: Referring Cori Henningsen: Treating Larrell Rapozo/Extender: [name] in Treatment: 32 Multidisciplinary Care Plan reviewed with physician Active Inactive Wound/Skin Impairment Nursing Diagnoses: Impaired tissue integrity Knowledge deficit related to ulceration/compromised skin integrity Goals: Patient will have a decrease in wound volume by X% from date: (specify in notes) Date Initiated: 03/26/2022 Target Resolution Date: 04/17/2025 Goal Status: Active Patient/caregiver will verbalize understanding of skin care regimen Date Initiated: 03/26/2022 Target Resolution Date: 04/17/2025 Goal Status: Active Cedillo, [name] ([id number]) [id number] Page 5 of 12 Ulcer/skin breakdown will have a volume reduction of 30% by week 4 Date Initiated: 03/26/2022 Date Inactivated: 05/21/2022 Target Resolution Date: 05/17/2022 Unmet Reason: see wound Goal Status: Unmet measurement.  Atallah, [name] ([id number]) [id number] Page 1 of 12 Visit Report for 02/18/2023 Arrival Information Details Patient Name: Date of Service: [name], [name] 02/18/2023 2:00 PM Medical Record Number: [id number] Patient Account Number: [id number] Date of Birth/Sex: Treating RN: Aug 27, 1945 (77 y.o. M) Primary Care Valerie Cones: [name] Other Clinician: Referring Malaquias Lenker: Treating Elliet Goodnow/Extender: [name] in Treatment: 22 Visit Information History Since Last Visit Added or deleted any medications: No Patient Arrived: Cane Any new allergies or adverse reactions: No Arrival Time: 14:57 Had a fall or experienced change in No Accompanied By: self activities of daily living that may affect Transfer Assistance: None risk of falls: Patient Identification Verified: Yes Signs or symptoms of abuse/neglect since last visito No Secondary Verification Process Completed: Yes Hospitalized since last visit: No Patient Requires Transmission-Based Precautions: No Implantable device outside of the clinic excluding No Patient Has Alerts: Yes cellular tissue based products placed in the center Patient Alerts: Patient on Blood Thinner since last visit: Right ABI in clinic [place] Has Dressing in Place as Prescribed: Yes Has Compression in Place as Prescribed: Yes Pain Present Now: No Electronic Signature(s) Signed: 02/19/2023 3:10:19 PM By: [name] RN Signed: 02/19/2023 3:52:59 PM By: Thayer Dallas Entered By: Thayer Dallas on 02/18/2023 11:57:23 -------------------------------------------------------------------------------- Compression Therapy Details Patient Name: Date of Service: [name], [name] 02/18/2023 2:00 PM Medical Record Number: [id number] Patient Account Number: [id number] Date of Birth/Sex: Treating RN: 1946/01/02 (77 y.o. [name] Primary Care Madelein Mahadeo: [name] Other Clinician: Referring  Tashanna Dolin: Treating Salimata Christenson/Extender: [name] Weeks in Treatment: 65 Compression Therapy Performed for Wound Assessment: Wound #12 Left,Anterior Lower Leg Performed By: Clinician [name], RN Compression Type: Double Layer Post Procedure Diagnosis Same as Pre-procedure Electronic Signature(s) Signed: 02/18/2023 5:53:00 PM By: [name] RN, [name] Entered By: [name] on 02/18/2023 12:21:06 Asmar, [name] ([id number]) [id number] Page 2 of 12 -------------------------------------------------------------------------------- Compression Therapy Details Patient Name: Date of Service: [name], [name] 02/18/2023 2:00 PM Medical Record Number: [id number] Patient Account Number: [id number] Date of Birth/Sex: Treating RN: 1945-06-08 (77 y.o. [name] Primary Care Neeko Pharo: [name] Other Clinician: Referring Danie Hannig: Treating Ilisa Hayworth/Extender: [name] Weeks in Treatment: 92 Compression Therapy Performed for Wound Assessment: Wound #15 Left,Distal,Posterior Lower Leg Performed By: Clinician [name], RN Compression Type: Double Layer Post Procedure Diagnosis Same as Pre-procedure Electronic Signature(s) Signed: 02/18/2023 5:53:00 PM By: [name] RN, [name] Entered By: [name] on 02/18/2023 12:21:06 -------------------------------------------------------------------------------- Compression Therapy Details Patient Name: Date of Service: [name], [name] 02/18/2023 2:00 PM Medical Record Number: [id number] Patient Account Number: [id number] Date of Birth/Sex: Treating RN: 1945/08/19 (77 y.o. [name] Primary Care Tyrea Froberg: [name] Other Clinician: Referring Aldwin Micalizzi: Treating Haniyyah Sakuma/Extender: [name] Weeks in Treatment: 10 Compression Therapy Performed for Wound Assessment: Wound #14 Right,Anterior Lower Leg Performed By: Clinician  [name], RN Compression Type: Double Layer Post Procedure Diagnosis Same as Pre-procedure Electronic Signature(s) Signed: 02/18/2023 5:53:00 PM By: [name] RN, [name] Entered By: [name] on 02/18/2023 12:21:06 -------------------------------------------------------------------------------- Compression Therapy Details Patient Name: Date of Service: [name], [name] 02/18/2023 2:00 PM Medical Record Number: [id number] Patient Account Number: [id number] Date of Birth/Sex: Treating RN: Oct 05, 1945 (77 y.o. [name] Primary Care Mariela Rex: [name] Other Clinician: Referring Kaliya Shreiner: Treating Gustavus Haskin/Extender: [name] Weeks in Treatment: 87 Compression Therapy Performed for Wound Assessment: Wound #6 Right,Medial Lower Leg Performed By:  [name] 02/18/2023 2:00 PM Medical Record Number: [id number] Patient Account Number: [id number] Date of Birth/Sex: Treating RN: Apr 26, 1946 (77 y.o. M) Primary Care Evanie Buckle: [name] Other Clinician: Referring Mackinley Kiehn: Treating Verginia Toohey/Extender: [name] Weeks in Treatment: 47 Wound Status Wound Number: 14 Primary Lymphedema Etiology: Wound Location: Right, Anterior Lower Leg Wound Open Wounding Event: Gradually Appeared Status: Date Acquired: 02/05/2023 Comorbid Anemia, Lymphedema, Deep Vein Thrombosis, Hypertension, Weeks Of Treatment: 1 History: Received Radiation Clustered Wound: Yes Photos Loisel, [name] ([id number]) [id number] Page 8 of 12 Wound Measurements Length: (cm) Width: (cm) Depth: (cm) Clustered Quantity: Area: (cm) Volume: (cm) 0.2 % Reduction in Area: 98.9% 0.2 % Reduction in Volume: 99% 0.1 Epithelialization: Large (67-100%) 2 Tunneling: No 0.031 Undermining: No 0.003 Wound Description Classification: Partial Thickness Exudate Amount: Medium Exudate Type: Serosanguineous Exudate Color: red, brown Foul Odor After Cleansing: No Slough/Fibrino Yes Wound Bed Granulation Amount: None Present (0%) Exposed  Structure Necrotic Amount: None Present (0%) Fascia Exposed: No Fat Layer (Subcutaneous Tissue) Exposed: Yes Tendon Exposed: No Muscle Exposed: No Joint Exposed: No Bone Exposed: No Periwound Skin Texture Texture Color No Abnormalities Noted: No No Abnormalities Noted: No Callus: No Atrophie Blanche: No Crepitus: No Cyanosis: No Excoriation: No Ecchymosis: No Induration: No Erythema: No Rash: No Hemosiderin Staining: No Scarring: No Mottled: No Pallor: No Moisture Rubor: No No Abnormalities Noted: No Dry / Scaly: No Temperature / Pain Maceration: No Temperature: No Abnormality Electronic Signature(s) Signed: 02/19/2023 3:10:19 PM By: [name] RN Signed: 02/19/2023 3:52:59 PM By: Thayer Dallas Entered By: Thayer Dallas on 02/18/2023 12:06:04 -------------------------------------------------------------------------------- Wound Assessment Details Patient Name: Date of Service: [name], [name] 02/18/2023 2:00 PM Medical Record Number: [id number] Patient Account Number: [id number] Date of Birth/Sex: Treating RN: 01/07/1946 (77 y.o. M) Primary Care Lanora Reveron: [name] Other Clinician: Referring Brayden Brodhead: Treating Anjelika Ausburn/Extender: Leveda Anna, [name] Weeks in Treatment: 24 Wound Status [name], [name] ([id number]) [id number] Page 9 of 12 Wound Number: 15 Primary Lymphedema Etiology: Wound Location: Left, Distal, Posterior Lower Leg Wound Open Wounding Event: Gradually Appeared Status: Date Acquired: 01/14/2023 Comorbid Anemia, Lymphedema, Deep Vein Thrombosis, Hypertension, Weeks Of Treatment: 0 History: Received Radiation Clustered Wound: No Photos Wound Measurements Length: (cm) 4.5 Width: (cm) 2.5 Depth: (cm) 0.1 Area: (cm) 8.836 Volume: (cm) 0.884 % Reduction in Area: % Reduction in Volume: Epithelialization: Small (1-33%) Tunneling: No Undermining: No Wound Description Classification: Full  Thickness Without Exposed Suppor Exudate Amount: Medium Exudate Type: Serosanguineous Exudate Color: red, brown t Structures Foul Odor After Cleansing: No Slough/Fibrino Yes Wound Bed Granulation Amount: Medium (34-66%) Exposed Structure Granulation Quality: Red Fascia Exposed: No Necrotic Amount: Medium (34-66%) Fat Layer (Subcutaneous Tissue) Exposed: Yes Necrotic Quality: Adherent Slough Tendon Exposed: No Muscle Exposed: No Joint Exposed: No Bone Exposed: No Periwound Skin Texture Texture Color No Abnormalities Noted: No No Abnormalities Noted: No Callus: No Atrophie Blanche: No Crepitus: No Cyanosis: No Excoriation: No Ecchymosis: No Induration: No Erythema: No Rash: No Hemosiderin Staining: Yes Scarring: No Mottled: No Pallor: No Moisture Rubor: No No Abnormalities Noted: No Dry / Scaly: No Maceration: No Electronic Signature(s) Signed: 03/02/2023 11:22:58 AM By: Karl Ito Entered By: Karl Ito on 02/18/2023 12:06:08 -------------------------------------------------------------------------------- Wound Assessment Details Patient Name: Date of Service: [name], [name] 02/18/2023 2:00 PM Medical Record Number: [id number] Patient Account Number: [id number] [name], [name] ([id number]) [phone].pdf Page 10 of 12 Date of Birth/Sex: Treating RN: 07/01/1945 (77 y.o. M) Primary Care Rmoni Keplinger: Other Clinician: [name] Referring Song Myre: Treating Bowman Higbie/Extender: Larina Bras  Ulcer/skin breakdown will have a volume reduction of 50% by week 8 Date Initiated: 03/26/2022 Date Inactivated: 05/21/2022 Target Resolution Date: 05/17/2022 Unmet Reason: see wound Goal Status:  Unmet measurement. Interventions: Assess patient/caregiver ability to obtain necessary supplies Assess patient/caregiver ability to perform ulcer/skin care regimen upon admission and as needed Assess ulceration(s) every visit Notes: Patient stated today, "I will take my fluid pill or pump not do both." Lorann Tani made aware. Electronic Signature(s) Signed: 02/18/2023 5:53:00 PM By: [name] RN, [name] Entered By: [name] on 02/18/2023 12:17:05 -------------------------------------------------------------------------------- Pain Assessment Details Patient Name: Date of Service: [name], [name] 02/18/2023 2:00 PM Medical Record Number: [id number] Patient Account Number: [id number] Date of Birth/Sex: Treating RN: Jul 22, 1945 (77 y.o. M) Primary Care Amra Shukla: [name] Other Clinician: Referring Andriana Casa: Treating Garnell Begeman/Extender: [name] Weeks in Treatment: 71 Active Problems Location of Pain Severity and Description of Pain Patient Has Paino No Site Locations Pain Management and Medication Current Pain Management: Electronic Signature(s) Signed: 02/19/2023 3:10:19 PM By: [name] RN Signed: 02/19/2023 3:52:59 PM By: Thayer Dallas Entered By: Thayer Dallas on 02/18/2023 11:57:53 Till, [name] ([id number]) [id number] Page 6 of 12 -------------------------------------------------------------------------------- Patient/Caregiver Education Details Patient Name: Date of Service: Vanosdol, [name] 10/2/2024andnbsp2:00 PM Medical Record Number: [id number] Patient Account Number: [id number] Date of Birth/Gender: Treating RN: 28-Dec-1945 (77 y.o. [name] Primary Care Physician: [name] Other Clinician: Referring Physician: Treating Physician/Extender: [name] in Treatment: 68 Education Assessment Education Provided To: Patient Education Topics Provided Wound/Skin  Impairment: Handouts: Caring for Your Ulcer Methods: Explain/Verbal Responses: Reinforcements needed Electronic Signature(s) Signed: 02/18/2023 5:53:00 PM By: [name] RN, [name] Entered By: [name] on 02/18/2023 12:17:19 -------------------------------------------------------------------------------- Wound Assessment Details Patient Name: Date of Service: Tackett, [name] 02/18/2023 2:00 PM Medical Record Number: [id number] Patient Account Number: [id number] Date of Birth/Sex: Treating RN: Feb 25, 1946 (77 y.o. M) Primary Care Christhoper Busbee: [name] Other Clinician: Referring Lashe Oliveira: Treating Navy Belay/Extender: Leveda Anna, [name] Weeks in Treatment: 47 Wound Status Wound Number: 12 Primary Lymphedema Etiology: Wound Location: Left, Anterior Lower Leg Wound Open Wounding Event: Frostbite Status: Date Acquired: 12/17/2022 Comorbid Anemia, Lymphedema, Deep Vein Thrombosis, Hypertension, Weeks Of Treatment: 9 History: Received Radiation Clustered Wound: No Photos Wound Measurements Length: (cm) 1.5 Charlie, Keni ([id number]) Width: (cm) Depth: (cm) Area: (cm) Volume: (cm) % Reduction in Area: 80% [id number] Page 7 of 12 0.6 % Reduction in Volume: 90% 0.1 Epithelialization: Small (1-33%) 0.707 Tunneling: No 0.071 Undermining: No Wound Description Classification: Full Thickness Without Exposed Support Structures Wound Margin: Distinct, outline attached Exudate Amount: Large Exudate Type: Serous Exudate Color: amber Foul Odor After Cleansing: No Slough/Fibrino Yes Wound Bed Granulation Amount: Large (67-100%) Exposed Structure Granulation Quality: Red, Pink Fascia Exposed: No Necrotic Amount: Small (1-33%) Fat Layer (Subcutaneous Tissue) Exposed: Yes Necrotic Quality: Adherent Slough Tendon Exposed: No Muscle Exposed: No Joint Exposed: No Bone Exposed: No Periwound Skin Texture Texture Color No Abnormalities  Noted: No No Abnormalities Noted: No Callus: No Atrophie Blanche: No Crepitus: No Cyanosis: No Excoriation: No Ecchymosis: No Induration: No Erythema: No Rash: No Hemosiderin Staining: Yes Scarring: No Mottled: No Pallor: No Moisture Rubor: No No Abnormalities Noted: No Dry / Scaly: No Temperature / Pain Maceration: No Temperature: No Abnormality Electronic Signature(s) Signed: 02/19/2023 3:10:19 PM By: [name] RN Signed: 02/19/2023 3:52:59 PM By: Thayer Dallas Entered By: Thayer Dallas on 02/18/2023 12:06:47 -------------------------------------------------------------------------------- Wound Assessment Details Patient Name: Date of Service: Hoaglin, [name]  [name] 02/18/2023 2:00 PM Medical Record Number: [id number] Patient Account Number: [id number] Date of Birth/Sex: Treating RN: Apr 26, 1946 (77 y.o. M) Primary Care Evanie Buckle: [name] Other Clinician: Referring Mackinley Kiehn: Treating Verginia Toohey/Extender: [name] Weeks in Treatment: 47 Wound Status Wound Number: 14 Primary Lymphedema Etiology: Wound Location: Right, Anterior Lower Leg Wound Open Wounding Event: Gradually Appeared Status: Date Acquired: 02/05/2023 Comorbid Anemia, Lymphedema, Deep Vein Thrombosis, Hypertension, Weeks Of Treatment: 1 History: Received Radiation Clustered Wound: Yes Photos Loisel, [name] ([id number]) [id number] Page 8 of 12 Wound Measurements Length: (cm) Width: (cm) Depth: (cm) Clustered Quantity: Area: (cm) Volume: (cm) 0.2 % Reduction in Area: 98.9% 0.2 % Reduction in Volume: 99% 0.1 Epithelialization: Large (67-100%) 2 Tunneling: No 0.031 Undermining: No 0.003 Wound Description Classification: Partial Thickness Exudate Amount: Medium Exudate Type: Serosanguineous Exudate Color: red, brown Foul Odor After Cleansing: No Slough/Fibrino Yes Wound Bed Granulation Amount: None Present (0%) Exposed  Structure Necrotic Amount: None Present (0%) Fascia Exposed: No Fat Layer (Subcutaneous Tissue) Exposed: Yes Tendon Exposed: No Muscle Exposed: No Joint Exposed: No Bone Exposed: No Periwound Skin Texture Texture Color No Abnormalities Noted: No No Abnormalities Noted: No Callus: No Atrophie Blanche: No Crepitus: No Cyanosis: No Excoriation: No Ecchymosis: No Induration: No Erythema: No Rash: No Hemosiderin Staining: No Scarring: No Mottled: No Pallor: No Moisture Rubor: No No Abnormalities Noted: No Dry / Scaly: No Temperature / Pain Maceration: No Temperature: No Abnormality Electronic Signature(s) Signed: 02/19/2023 3:10:19 PM By: [name] RN Signed: 02/19/2023 3:52:59 PM By: Thayer Dallas Entered By: Thayer Dallas on 02/18/2023 12:06:04 -------------------------------------------------------------------------------- Wound Assessment Details Patient Name: Date of Service: [name], [name] 02/18/2023 2:00 PM Medical Record Number: [id number] Patient Account Number: [id number] Date of Birth/Sex: Treating RN: 01/07/1946 (77 y.o. M) Primary Care Lanora Reveron: [name] Other Clinician: Referring Brayden Brodhead: Treating Anjelika Ausburn/Extender: Leveda Anna, [name] Weeks in Treatment: 24 Wound Status [name], [name] ([id number]) [id number] Page 9 of 12 Wound Number: 15 Primary Lymphedema Etiology: Wound Location: Left, Distal, Posterior Lower Leg Wound Open Wounding Event: Gradually Appeared Status: Date Acquired: 01/14/2023 Comorbid Anemia, Lymphedema, Deep Vein Thrombosis, Hypertension, Weeks Of Treatment: 0 History: Received Radiation Clustered Wound: No Photos Wound Measurements Length: (cm) 4.5 Width: (cm) 2.5 Depth: (cm) 0.1 Area: (cm) 8.836 Volume: (cm) 0.884 % Reduction in Area: % Reduction in Volume: Epithelialization: Small (1-33%) Tunneling: No Undermining: No Wound Description Classification: Full  Thickness Without Exposed Suppor Exudate Amount: Medium Exudate Type: Serosanguineous Exudate Color: red, brown t Structures Foul Odor After Cleansing: No Slough/Fibrino Yes Wound Bed Granulation Amount: Medium (34-66%) Exposed Structure Granulation Quality: Red Fascia Exposed: No Necrotic Amount: Medium (34-66%) Fat Layer (Subcutaneous Tissue) Exposed: Yes Necrotic Quality: Adherent Slough Tendon Exposed: No Muscle Exposed: No Joint Exposed: No Bone Exposed: No Periwound Skin Texture Texture Color No Abnormalities Noted: No No Abnormalities Noted: No Callus: No Atrophie Blanche: No Crepitus: No Cyanosis: No Excoriation: No Ecchymosis: No Induration: No Erythema: No Rash: No Hemosiderin Staining: Yes Scarring: No Mottled: No Pallor: No Moisture Rubor: No No Abnormalities Noted: No Dry / Scaly: No Maceration: No Electronic Signature(s) Signed: 03/02/2023 11:22:58 AM By: Karl Ito Entered By: Karl Ito on 02/18/2023 12:06:08 -------------------------------------------------------------------------------- Wound Assessment Details Patient Name: Date of Service: [name], [name] 02/18/2023 2:00 PM Medical Record Number: [id number] Patient Account Number: [id number] [name], [name] ([id number]) [phone].pdf Page 10 of 12 Date of Birth/Sex: Treating RN: 07/01/1945 (77 y.o. M) Primary Care Rmoni Keplinger: Other Clinician: [name] Referring Song Myre: Treating Bowman Higbie/Extender: Larina Bras  Ulcer/skin breakdown will have a volume reduction of 50% by week 8 Date Initiated: 03/26/2022 Date Inactivated: 05/21/2022 Target Resolution Date: 05/17/2022 Unmet Reason: see wound Goal Status:  Unmet measurement. Interventions: Assess patient/caregiver ability to obtain necessary supplies Assess patient/caregiver ability to perform ulcer/skin care regimen upon admission and as needed Assess ulceration(s) every visit Notes: Patient stated today, "I will take my fluid pill or pump not do both." Lorann Tani made aware. Electronic Signature(s) Signed: 02/18/2023 5:53:00 PM By: [name] RN, [name] Entered By: [name] on 02/18/2023 12:17:05 -------------------------------------------------------------------------------- Pain Assessment Details Patient Name: Date of Service: [name], [name] 02/18/2023 2:00 PM Medical Record Number: [id number] Patient Account Number: [id number] Date of Birth/Sex: Treating RN: Jul 22, 1945 (77 y.o. M) Primary Care Amra Shukla: [name] Other Clinician: Referring Andriana Casa: Treating Garnell Begeman/Extender: [name] Weeks in Treatment: 71 Active Problems Location of Pain Severity and Description of Pain Patient Has Paino No Site Locations Pain Management and Medication Current Pain Management: Electronic Signature(s) Signed: 02/19/2023 3:10:19 PM By: [name] RN Signed: 02/19/2023 3:52:59 PM By: Thayer Dallas Entered By: Thayer Dallas on 02/18/2023 11:57:53 Till, [name] ([id number]) [id number] Page 6 of 12 -------------------------------------------------------------------------------- Patient/Caregiver Education Details Patient Name: Date of Service: Vanosdol, [name] 10/2/2024andnbsp2:00 PM Medical Record Number: [id number] Patient Account Number: [id number] Date of Birth/Gender: Treating RN: 28-Dec-1945 (77 y.o. [name] Primary Care Physician: [name] Other Clinician: Referring Physician: Treating Physician/Extender: [name] in Treatment: 68 Education Assessment Education Provided To: Patient Education Topics Provided Wound/Skin  Impairment: Handouts: Caring for Your Ulcer Methods: Explain/Verbal Responses: Reinforcements needed Electronic Signature(s) Signed: 02/18/2023 5:53:00 PM By: [name] RN, [name] Entered By: [name] on 02/18/2023 12:17:19 -------------------------------------------------------------------------------- Wound Assessment Details Patient Name: Date of Service: Tackett, [name] 02/18/2023 2:00 PM Medical Record Number: [id number] Patient Account Number: [id number] Date of Birth/Sex: Treating RN: Feb 25, 1946 (77 y.o. M) Primary Care Christhoper Busbee: [name] Other Clinician: Referring Lashe Oliveira: Treating [employer] Belay/Extender: Leveda Anna, [name] Weeks in Treatment: 47 Wound Status Wound Number: 12 Primary Lymphedema Etiology: Wound Location: Left, Anterior Lower Leg Wound Open Wounding Event: Frostbite Status: Date Acquired: 12/17/2022 Comorbid Anemia, Lymphedema, Deep Vein Thrombosis, Hypertension, Weeks Of Treatment: 9 History: Received Radiation Clustered Wound: No Photos Wound Measurements Length: (cm) 1.5 Charlie, Keni ([id number]) Width: (cm) Depth: (cm) Area: (cm) Volume: (cm) % Reduction in Area: 80% [id number] Page 7 of 12 0.6 % Reduction in Volume: 90% 0.1 Epithelialization: Small (1-33%) 0.707 Tunneling: No 0.071 Undermining: No Wound Description Classification: Full Thickness Without Exposed Support Structures Wound Margin: Distinct, outline attached Exudate Amount: Large Exudate Type: Serous Exudate Color: amber Foul Odor After Cleansing: No Slough/Fibrino Yes Wound Bed Granulation Amount: Large (67-100%) Exposed Structure Granulation Quality: Red, Pink Fascia Exposed: No Necrotic Amount: Small (1-33%) Fat Layer (Subcutaneous Tissue) Exposed: Yes Necrotic Quality: Adherent Slough Tendon Exposed: No Muscle Exposed: No Joint Exposed: No Bone Exposed: No Periwound Skin Texture Texture Color No Abnormalities  Noted: No No Abnormalities Noted: No Callus: No Atrophie Blanche: No Crepitus: No Cyanosis: No Excoriation: No Ecchymosis: No Induration: No Erythema: No Rash: No Hemosiderin Staining: Yes Scarring: No Mottled: No Pallor: No Moisture Rubor: No No Abnormalities Noted: No Dry / Scaly: No Temperature / Pain Maceration: No Temperature: No Abnormality Electronic Signature(s) Signed: 02/19/2023 3:10:19 PM By: [name] RN Signed: 02/19/2023 3:52:59 PM By: Thayer Dallas Entered By: Thayer Dallas on 02/18/2023 12:06:47 -------------------------------------------------------------------------------- Wound Assessment Details Patient Name: Date of Service: Hoaglin, [name]

## 2023-03-04 ENCOUNTER — Encounter (HOSPITAL_BASED_OUTPATIENT_CLINIC_OR_DEPARTMENT_OTHER): Payer: Medicare Other | Admitting: Physician Assistant

## 2023-03-04 DIAGNOSIS — I87333 Chronic venous hypertension (idiopathic) with ulcer and inflammation of bilateral lower extremity: Secondary | ICD-10-CM | POA: Diagnosis not present

## 2023-03-04 NOTE — Progress Notes (Addendum)
Date of Service: [name] [name] [name] 03/04/2023 3:00 PM Medical Record Number: [id number] Patient Account Number: [id number] Date of Birth/Sex: Treating RN: [birth year] (77 y.o. [name] [name] Primary Care Provider: Ricki [name] Other Clinician: Referring Provider: Treating Provider/Extender: [name] [name] Weeks in Treatment: 57 Debridement Performed for Assessment: Wound #6 Right,Medial Lower Leg Performed By: Physician [name] Kelp, PA The following information was scribed by: Karie [name] The information was scribed for: [name] [name] Debridement Type: Debridement Level of Consciousness (Pre-procedure): Awake and Alert Pre-procedure Verification/Time Out Yes - 15:47 Taken: Start Time: 15:47 Pain Control: Lidocaine 4% T opical Solution Percent of Wound Bed Debrided: 100% T Area Debrided (cm): otal 28.26 Tissue and other material debrided: Viable, Non-Viable, Slough, Subcutaneous, Slough Level: Skin/Subcutaneous Tissue Debridement Description: Excisional Instrument: Curette Bleeding: Minimum Hemostasis Achieved:  Pressure End Time: 15:52 Procedural Pain: 0 Post Procedural Pain: 0 Response to Treatment: Procedure was tolerated well Level of Consciousness (Post- Awake and Alert procedure): Post Debridement Measurements of Total Wound Length: (cm) 8 Width: (cm) 4.5 Depth: (cm) 0.5 Volume: (cm) 14.137 Character of Wound/Ulcer Post Debridement: Improved Post Procedure Diagnosis Same as Pre-procedure Electronic Signature(s) Signed: 03/04/2023 4:26:00 PM By: [name] Derry PA-C Signed: 03/04/2023 5:01:16 PM By: [name] RN Entered By: Karie [name] on 03/04/2023 13:03:58 -------------------------------------------------------------------------------- Debridement Details Patient Name: Date of Service: [name] [name] [name] 03/04/2023 3:00 PM Medical Record Number: [id number] Patient Account Number: [id number] Date of Birth/Sex: Treating RN: [birth year] (77 y.o. [name] [name] Primary Care Provider: Ricki [name] Other Clinician: Referring Provider: Treating Provider/Extender: [name] [name] Weeks in Treatment: 61 Debridement Performed for Assessment: Wound #7 Left,Medial Lower Leg Performed By: Physician [name] Kelp, PA The following information was scribed by: Karie [name] The information was scribed for: [name] [name] Debridement Type: Debridement [name] [name] [name] ([id number]) [id number] Page 4 of 17 Level of Consciousness (Pre-procedure): Awake and Alert Pre-procedure Verification/Time Out Yes - 15:47 Taken: Start Time: 15:47 Pain Control: Lidocaine 4% T opical Solution Percent of Wound Bed Debrided: 100% T Area Debrided (cm): otal 9.89 Tissue and other material debrided: Viable, Non-Viable, Slough, Subcutaneous, Slough Level: Skin/Subcutaneous Tissue Debridement Description: Excisional Instrument: Curette Bleeding: Minimum Hemostasis Achieved: Pressure End Time: 15:52 Procedural Pain: 0 Post Procedural Pain: 0 Response  to Treatment: Procedure was tolerated well Level of Consciousness (Post- Awake and Alert procedure): Post Debridement Measurements of Total Wound Length: (cm) 3 Width: (cm) 4.2 Depth: (cm) 0.2 Volume: (cm) 1.979 Character of Wound/Ulcer Post Debridement: Improved Post Procedure Diagnosis Same as Pre-procedure Electronic Signature(s) Signed: 03/04/2023 4:26:00 PM By: [name] Derry PA-C Signed: 03/04/2023 5:01:16 PM By: [name] RN Entered By: Karie [name] on 03/04/2023 13:04:21 -------------------------------------------------------------------------------- HPI Details Patient Name: Date of Service: [name] [name] [name] 03/04/2023 3:00 PM Medical Record Number: [id number] Patient Account Number: [id number] Date of Birth/Sex: Treating RN: [birth year] (77 y.o. M) Primary Care Provider: Ricki [name] Other Clinician: Referring Provider: Treating Provider/Extender: [name] [name] Weeks in Treatment: 37 History of Present Illness HPI Description: ADMISSION 02/23/2020 Patient is a 77 year old man who lives in [place] who arrives accompanied by his wife. He has a history of chronic lymphedema and venous insufficiency in his bilateral lower legs which may have something to do that with having a history of DVT as well as being treated for prostate cancer. In any case he recently got compression pumps at home but compliance has been an issue here. He has compression stockings however they are probably  today. He is less weeping and the legs are actually significantly smaller. He tells me has been trying to stay in bed with his legs elevated more. 02-25-2023 upon evaluation today patient appears to be doing well currently in regard to his legs in general compared to where we have been. Fortunately I do not see any signs of active infection locally or systemically which is great news and in general I do believe that we are making good headway here towards complete closure. I am hopeful that he will continue to show signs of improvement as we progress going forward. He does seem to have been taking his fluid pills as well as  elevating his legs and using lymphedema pumps on a regular basis. He is trying to stay out of the hospital. 03-04-2023 upon evaluation today patient appears to be doing well currently in regard to his wound. He has been tolerating the dressing changes without complication. Fortunately there does not appear to be any signs of active infection at this time. Electronic Signature(s) Signed: 03/04/2023 4:17:16 PM By: [name] Derry PA-C Entered By: [name] [name] on 03/04/2023 13:17:16 -------------------------------------------------------------------------------- Physical Exam Details Patient Name: Date of Service: [name] [name] [name] 03/04/2023 3:00 PM Medical Record Number: [id number] Patient Account Number: [id number] Date of Birth/Sex: Treating RN: 10-05-1945 (77 y.o. M) Primary Care Provider: Ricki [name] Other Clinician: Referring Provider: Treating Provider/Extender: [name] [name] in Treatment: 58 Level, [name] [name] ([id number]) [id number] Page 8 of 17 Constitutional Well-nourished and well-hydrated in no acute distress. Respiratory normal breathing without difficulty. Psychiatric this patient is able to make decisions and demonstrates good insight into disease process. Alert and Oriented x 3. pleasant and cooperative. Notes Upon inspection patient's wound bed actually showed signs of good granulation and epithelization at this point. Actually feel like he is making good headway towards closure and very pleased with where we stand I do believe that he is doing better compared to where we were previous. Electronic Signature(s) Signed: 03/04/2023 4:17:32 PM By: [name] Derry PA-C Entered By: [name] [name] on 03/04/2023 13:17:32 -------------------------------------------------------------------------------- Physician Orders Details Patient Name: Date of Service: [name] [name] [name] 03/04/2023 3:00 PM Medical Record Number: [id number] Patient  Account Number: [id number] Date of Birth/Sex: Treating RN: 08/01/1945 (77 y.o. [name] [name] Primary Care Provider: Ricki [name] Other Clinician: Referring Provider: Treating Provider/Extender: [name] [name] in Treatment: 16 Verbal / Phone Orders: No Diagnosis Coding ICD-10 Coding Code Description [phone] Chronic venous hypertension (idiopathic) with ulcer and inflammation of bilateral lower extremity I89.0 Lymphedema, not elsewhere classified 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.818 Non-pressure chronic ulcer of other part of right lower leg with other specified severity L84 Corns and callosities Follow-up Appointments ppointment in 1 week. [name] Derry PA Wednesday ****extra time 60 minutes**** 03/11/2023 3pm Return A ppointment in 2 weeks. [name] Derry PA (already scheduled) Return A Return appointment in 3 weeks. - 03/18/2023 3pm Jermiya Reichl (already scheduled) Return appointment in 1 month. - Please see front desk to schedule appointment Anesthetic (In clinic) Topical Lidocaine 5% applied to wound bed Bathing/ Shower/ Hygiene May shower with protection but do not get wound dressing(s) wet. Protect dressing(s) with water repellant cover (for example, large plastic bag) or a cast cover and may then take shower. Edema Control - Lymphedema / SCD / Other Lymphedema Pumps. Use Lymphedema pumps on leg(s) 2-3 times a day for 45-60 minutes. If wearing any wraps  Per Week/30 Days Discharge Instructions: May shower and wash wound with dial antibacterial soap and water prior to dressing change. Peri-Wound Care: Sween Lotion (Moisturizing lotion) 1 x Per Week/30 Days Discharge Instructions: Apply moisturizing lotion as directed Prim Dressing: Aquacel Ag 1 x Per Week/30 Days ary Discharge Instructions: apply directly to wound bed. Secondary Dressing: ABD Pad, 8x10 1 x Per  Week/30 Days Discharge Instructions: Apply over primary dressing as directed. Secondary Dressing: OptiLock Super Absorbent, 5x5.5 (in/in) (Generic) 1 x Per Week/30 Days Discharge Instructions: Apply directly to wound bed as directed Compression Wrap: Urgo K2, (equivalent to a 4 layer) two layer compression system, regular 1 x Per Week/30 Days Discharge Instructions: Apply Urgo K2 as directed (alternative to 4 layer compression). Mirelez, [name] [name] ([id number]) [id number] Page 10 of 17 Electronic Signature(s) Signed: 03/04/2023 4:26:00 PM By: [name] Derry PA-C Signed: 03/04/2023 5:01:16 PM By: [name] RN Entered By: Karie [name] on 03/04/2023 13:02:57 -------------------------------------------------------------------------------- Problem List Details Patient Name: Date of Service: [name] [name] [name] 03/04/2023 3:00 PM Medical Record Number: [id number] Patient Account Number: [id number] Date of Birth/Sex: Treating RN: 08/17/1945 (77 y.o. 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 of 06/11/2022 No Yes bilateral lower extremity I89.0 Lymphedema, not elsewhere classified 03/26/2022 No Yes L97.828 Non-pressure chronic ulcer of other part of left lower leg with other specified 03/26/2022 No Yes severity L97.818 Non-pressure chronic ulcer of other part of right lower leg with other specified 03/26/2022 No Yes severity L84 Corns and callosities 10/15/2022 No Yes Inactive Problems Resolved Problems Electronic Signature(s) Signed: 03/04/2023 3:18:28 PM By: [name] Derry PA-C Entered By: [name] [name] on 03/04/2023  12:18:27 -------------------------------------------------------------------------------- Progress Note Details Patient Name: Date of Service: [name] [name] [name] 03/04/2023 3:00 PM Medical Record Number: [id number] Patient Account Number: [id number] Date of Birth/Sex: Treating RN: 11/13/1945 (77 y.o. M) Pitera, [name] [name] ([id number]) [id number] Page 11 of 17 Primary Care Provider: Ricki [name] Other Clinician: Referring Provider: Treating Provider/Extender: [name] [name] Weeks in Treatment: 36 Subjective Chief Complaint Information obtained from Patient 02/23/2020; patient is here for wounds on his bilateral lower legs in the setting of severe lymphedema 03/26/2022; patient is here for wounds on his bilateral lower legs medial aspect History of Present Illness (HPI) ADMISSION 02/23/2020 Patient is a 77 year old man who lives in [place] who arrives accompanied by his wife. He has a history of chronic lymphedema and venous insufficiency in his bilateral lower legs which may have something to do that with having a history of DVT as well as being treated for prostate cancer. In any case he recently got compression pumps at home but compliance has been an issue here. He has compression stockings however they are probably not sufficient enough to control swelling. They tell us that things deteriorated for him in late August he was admitted to [facility] for 7 days. This was with cellulitis I think of his bilateral lower legs. Discharge he was noted to have wounds on his bilateral lower legs. He was discharged on Bactrim. They tried to get him home health through [facility] of course they declined him. His wife is been wrapping these applying some form of silver foam dressing. He has a history of wounds before although nothing that would not heal with basic home topical dressings. He  Hasting, [name] [name] ([id number]) [id number] Page 1 of 17 Visit Report for 03/04/2023 Chief Complaint Document Details Patient Name: Date of Service: [name] [name] [name] 03/04/2023 3:00 PM Medical Record Number: [id number] Patient Account Number: [id number] Date of Birth/Sex: Treating RN: 19-Feb-1946 (77 y.o. M) Primary Care Provider: Ricki [name] Other Clinician: Referring Provider: Treating Provider/Extender: [name] [name] Weeks in Treatment: 70 Information Obtained from: Patient Chief Complaint 02/23/2020; patient is here for wounds on his bilateral lower legs in the setting of severe lymphedema 03/26/2022; patient is here for wounds on his bilateral lower legs medial aspect Electronic Signature(s) Signed: 03/04/2023 3:18:35 PM By: [name] Derry PA-C Entered By: [name] [name] on 03/04/2023 12:18:35 -------------------------------------------------------------------------------- Debridement Details Patient Name: Date of Service: [name] [name] [name] 03/04/2023 3:00 PM Medical Record Number: [id number] Patient Account Number: [id number] Date of Birth/Sex: Treating RN: 1945/06/15 (77 y.o. [name] [name] Primary Care Provider: Ricki [name] Other Clinician: Referring Provider: Treating Provider/Extender: [name] [name] Weeks in Treatment: 67 Debridement Performed for Assessment: Wound #12 Left,Anterior Lower Leg Performed By: Physician [name] Kelp, PA The following information was scribed by: Karie [name] The information was scribed for: [name] [name] Debridement Type: Debridement Level of Consciousness (Pre-procedure): Awake and Alert Pre-procedure Verification/Time Out Yes - 15:47 Taken: Start Time: 15:47 Pain Control: Lidocaine 4% T opical Solution Percent of Wound Bed Debrided: 100% T Area Debrided (cm): otal 0.39 Tissue and other material debrided: Viable, Non-Viable, Slough, Subcutaneous, Slough Level:  Skin/Subcutaneous Tissue Debridement Description: Excisional Instrument: Curette Bleeding: Minimum Hemostasis Achieved: Pressure End Time: 15:52 Procedural Pain: 0 Post Procedural Pain: 0 Response to Treatment: Procedure was tolerated well Level of Consciousness (Post- Awake and Alert procedure): Post Debridement Measurements of Total Wound Length: (cm) 1 Width: (cm) 0.5 Depth: (cm) 0.1 [name] [name] ([id number]) [id number] Page 2 of 17 Volume: (cm) 0.039 Character of Wound/Ulcer Post Debridement: Improved Post Procedure Diagnosis Same as Pre-procedure Electronic Signature(s) Signed: 03/04/2023 4:26:00 PM By: [name] Derry PA-C Signed: 03/04/2023 5:01:16 PM By: [name] RN Entered By: Karie [name] on 03/04/2023 13:03:21 -------------------------------------------------------------------------------- Debridement Details Patient Name: Date of Service: [name] [name] [name] 03/04/2023 3:00 PM Medical Record Number: [id number] Patient Account Number: [id number] Date of Birth/Sex: Treating RN: Aug 09, 1945 (77 y.o. [name] [name] Primary Care Provider: Ricki [name] Other Clinician: Referring Provider: Treating Provider/Extender: [name] [name] Weeks in Treatment: 14 Debridement Performed for Assessment: Wound #15 Left,Distal,Posterior Lower Leg Performed By: Physician [name] Kelp, PA The following information was scribed by: Karie [name] The information was scribed for: [name] [name] Debridement Type: Debridement Level of Consciousness (Pre-procedure): Awake and Alert Pre-procedure Verification/Time Out Yes - 15:47 Taken: Start Time: 15:47 Pain Control: Lidocaine 4% T opical Solution Percent of Wound Bed Debrided: 100% T Area Debrided (cm): otal 4.12 Tissue and other material debrided: Viable, Non-Viable, Slough, Subcutaneous, Slough Level: Skin/Subcutaneous Tissue Debridement Description:  Excisional Instrument: Curette Bleeding: Minimum Hemostasis Achieved: Pressure End Time: 15:52 Procedural Pain: 0 Post Procedural Pain: 0 Response to Treatment: Procedure was tolerated well Level of Consciousness (Post- Awake and Alert procedure): Post Debridement Measurements of Total Wound Length: (cm) 3.5 Width: (cm) 1.5 Depth: (cm) 0.1 Volume: (cm) 0.412 Character of Wound/Ulcer Post Debridement: Improved Post Procedure Diagnosis Same as Pre-procedure Electronic Signature(s) Signed: 03/04/2023 4:26:00 PM By: [name] Derry PA-C Signed: 03/04/2023 5:01:16 PM By: [name] RN Entered By: Karie [name] on 03/04/2023 13:03:37 Burchill, [name] [name] ([id number]) [id number] Page 3 of 17 -------------------------------------------------------------------------------- Debridement Details Patient Name:  today. He is less weeping and the legs are actually significantly smaller. He tells me has been trying to stay in bed with his legs elevated more. 02-25-2023 upon evaluation today patient appears to be doing well currently in regard to his legs in general compared to where we have been. Fortunately I do not see any signs of active infection locally or systemically which is great news and in general I do believe that we are making good headway here towards complete closure. I am hopeful that he will continue to show signs of improvement as we progress going forward. He does seem to have been taking his fluid pills as well as  elevating his legs and using lymphedema pumps on a regular basis. He is trying to stay out of the hospital. 03-04-2023 upon evaluation today patient appears to be doing well currently in regard to his wound. He has been tolerating the dressing changes without complication. Fortunately there does not appear to be any signs of active infection at this time. Electronic Signature(s) Signed: 03/04/2023 4:17:16 PM By: [name] Derry PA-C Entered By: [name] [name] on 03/04/2023 13:17:16 -------------------------------------------------------------------------------- Physical Exam Details Patient Name: Date of Service: [name] [name] [name] 03/04/2023 3:00 PM Medical Record Number: [id number] Patient Account Number: [id number] Date of Birth/Sex: Treating RN: 10-05-1945 (77 y.o. M) Primary Care Provider: Ricki [name] Other Clinician: Referring Provider: Treating Provider/Extender: [name] [name] in Treatment: 58 Level, [name] [name] ([id number]) [id number] Page 8 of 17 Constitutional Well-nourished and well-hydrated in no acute distress. Respiratory normal breathing without difficulty. Psychiatric this patient is able to make decisions and demonstrates good insight into disease process. Alert and Oriented x 3. pleasant and cooperative. Notes Upon inspection patient's wound bed actually showed signs of good granulation and epithelization at this point. Actually feel like he is making good headway towards closure and very pleased with where we stand I do believe that he is doing better compared to where we were previous. Electronic Signature(s) Signed: 03/04/2023 4:17:32 PM By: [name] Derry PA-C Entered By: [name] [name] on 03/04/2023 13:17:32 -------------------------------------------------------------------------------- Physician Orders Details Patient Name: Date of Service: [name] [name] [name] 03/04/2023 3:00 PM Medical Record Number: [id number] Patient  Account Number: [id number] Date of Birth/Sex: Treating RN: 08/01/1945 (77 y.o. [name] [name] Primary Care Provider: Ricki [name] Other Clinician: Referring Provider: Treating Provider/Extender: [name] [name] in Treatment: 16 Verbal / Phone Orders: No Diagnosis Coding ICD-10 Coding Code Description [phone] Chronic venous hypertension (idiopathic) with ulcer and inflammation of bilateral lower extremity I89.0 Lymphedema, not elsewhere classified 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.818 Non-pressure chronic ulcer of other part of right lower leg with other specified severity L84 Corns and callosities Follow-up Appointments ppointment in 1 week. [name] Derry PA Wednesday ****extra time 60 minutes**** 03/11/2023 3pm Return A ppointment in 2 weeks. [name] Derry PA (already scheduled) Return A Return appointment in 3 weeks. - 03/18/2023 3pm Jermiya Reichl (already scheduled) Return appointment in 1 month. - Please see front desk to schedule appointment Anesthetic (In clinic) Topical Lidocaine 5% applied to wound bed Bathing/ Shower/ Hygiene May shower with protection but do not get wound dressing(s) wet. Protect dressing(s) with water repellant cover (for example, large plastic bag) or a cast cover and may then take shower. Edema Control - Lymphedema / SCD / Other Lymphedema Pumps. Use Lymphedema pumps on leg(s) 2-3 times a day for 45-60 minutes. If wearing any wraps  Discharge Instructions: Apply Urgo K2 as directed (alternative to 4 layer compression). 1. I recommend that we have the patient continue to monitor for any signs of infection or worsening. Based on what I see I do believe that the patient is making good headway towards closure. I think he is doing much better than where has been and I did encourage him to continue with appropriate offloading he is in agreement with the plan. 2. I am good recommend as well the patient should continue to use the Urgo K2 compression wrap which I think is doing a good job of using Aquacel over top of the wounds and OPTi lock over top of that. We will see patient back for reevaluation in 1 week here in the clinic. If anything worsens or changes patient will contact our office for additional recommendations. Electronic Signature(s) Signed: 03/04/2023 4:18:04 PM By: [name] Derry PA-C Entered By: [name] [name] on 03/04/2023 13:18:04 -------------------------------------------------------------------------------- SuperBill Details Patient Name: Date of Service: [name] [name] [name] 03/04/2023 Medical Record Number: [id number] Patient Account Number: [id number] Date of Birth/Sex: Treating RN: 12-08-1945 (77 y.o. M) Jent, [name] [name] ([id number]) [id number] Page 17 of 17 Primary Care Provider: Ricki [name] Other Clinician: Referring Provider: Treating Provider/Extender: [name] [name] Weeks in Treatment: 49 Diagnosis Coding ICD-10 Codes Code Description [phone] Chronic venous hypertension (idiopathic) with ulcer and inflammation of bilateral lower extremity I89.0 Lymphedema, not elsewhere classified 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.818 Non-pressure chronic ulcer of other part of right lower leg with other specified severity L84 Corns and callosities Facility Procedures : CPT4  Code: 33295188 Description: 11042 - DEB SUBQ TISSUE 20 SQ CM/< ICD-10 Diagnosis Description L97.828 Non-pressure chronic ulcer of other part of left lower leg with other specified L97.818 Non-pressure chronic ulcer of other part of right lower leg with other specified Modifier: severity severity Quantity: 1 : CPT4 Code: [id number] Description: 11045 - DEB SUBQ TISS EA ADDL 20CM ICD-10 Diagnosis Description L97.828 Non-pressure chronic ulcer of other part of left lower leg with other specified L97.818 Non-pressure chronic ulcer of other part of right lower leg with other specified Modifier: severity severity Quantity: 2 Physician Procedures : CPT4 Code Description Modifier [id number] 11042 - WC PHYS SUBQ TISS 20 SQ CM ICD-10 Diagnosis Description 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 Quantity: 1 : [id number] 11045 - WC PHYS SUBQ TISS EA ADDL 20 CM ICD-10 Diagnosis Description 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.818 Non-pressure chronic ulcer of other part of right lower leg with other  specified severity Quantity: 2 Electronic Signature(s) Signed: 03/04/2023 4:25:34 PM By: [name] Derry PA-C Entered By: [name] [name] on 03/04/2023 13:25:34  Date of Service: [name] [name] [name] 03/04/2023 3:00 PM Medical Record Number: [id number] Patient Account Number: [id number] Date of Birth/Sex: Treating RN: [birth year] (77 y.o. [name] [name] Primary Care Provider: Ricki [name] Other Clinician: Referring Provider: Treating Provider/Extender: [name] [name] Weeks in Treatment: 57 Debridement Performed for Assessment: Wound #6 Right,Medial Lower Leg Performed By: Physician [name] Kelp, PA The following information was scribed by: Karie [name] The information was scribed for: [name] [name] Debridement Type: Debridement Level of Consciousness (Pre-procedure): Awake and Alert Pre-procedure Verification/Time Out Yes - 15:47 Taken: Start Time: 15:47 Pain Control: Lidocaine 4% T opical Solution Percent of Wound Bed Debrided: 100% T Area Debrided (cm): otal 28.26 Tissue and other material debrided: Viable, Non-Viable, Slough, Subcutaneous, Slough Level: Skin/Subcutaneous Tissue Debridement Description: Excisional Instrument: Curette Bleeding: Minimum Hemostasis Achieved:  Pressure End Time: 15:52 Procedural Pain: 0 Post Procedural Pain: 0 Response to Treatment: Procedure was tolerated well Level of Consciousness (Post- Awake and Alert procedure): Post Debridement Measurements of Total Wound Length: (cm) 8 Width: (cm) 4.5 Depth: (cm) 0.5 Volume: (cm) 14.137 Character of Wound/Ulcer Post Debridement: Improved Post Procedure Diagnosis Same as Pre-procedure Electronic Signature(s) Signed: 03/04/2023 4:26:00 PM By: [name] Derry PA-C Signed: 03/04/2023 5:01:16 PM By: [name] RN Entered By: Karie [name] on 03/04/2023 13:03:58 -------------------------------------------------------------------------------- Debridement Details Patient Name: Date of Service: [name] [name] [name] 03/04/2023 3:00 PM Medical Record Number: [id number] Patient Account Number: [id number] Date of Birth/Sex: Treating RN: [birth year] (77 y.o. [name] [name] Primary Care Provider: Ricki [name] Other Clinician: Referring Provider: Treating Provider/Extender: [name] [name] Weeks in Treatment: 61 Debridement Performed for Assessment: Wound #7 Left,Medial Lower Leg Performed By: Physician [name] Kelp, PA The following information was scribed by: Karie [name] The information was scribed for: [name] [name] Debridement Type: Debridement [name] [name] [name] ([id number]) [id number] Page 4 of 17 Level of Consciousness (Pre-procedure): Awake and Alert Pre-procedure Verification/Time Out Yes - 15:47 Taken: Start Time: 15:47 Pain Control: Lidocaine 4% T opical Solution Percent of Wound Bed Debrided: 100% T Area Debrided (cm): otal 9.89 Tissue and other material debrided: Viable, Non-Viable, Slough, Subcutaneous, Slough Level: Skin/Subcutaneous Tissue Debridement Description: Excisional Instrument: Curette Bleeding: Minimum Hemostasis Achieved: Pressure End Time: 15:52 Procedural Pain: 0 Post Procedural Pain: 0 Response  to Treatment: Procedure was tolerated well Level of Consciousness (Post- Awake and Alert procedure): Post Debridement Measurements of Total Wound Length: (cm) 3 Width: (cm) 4.2 Depth: (cm) 0.2 Volume: (cm) 1.979 Character of Wound/Ulcer Post Debridement: Improved Post Procedure Diagnosis Same as Pre-procedure Electronic Signature(s) Signed: 03/04/2023 4:26:00 PM By: [name] Derry PA-C Signed: 03/04/2023 5:01:16 PM By: [name] RN Entered By: Karie [name] on 03/04/2023 13:04:21 -------------------------------------------------------------------------------- HPI Details Patient Name: Date of Service: [name] [name] [name] 03/04/2023 3:00 PM Medical Record Number: [id number] Patient Account Number: [id number] Date of Birth/Sex: Treating RN: [birth year] (77 y.o. M) Primary Care Provider: Ricki [name] Other Clinician: Referring Provider: Treating Provider/Extender: [name] [name] Weeks in Treatment: 37 History of Present Illness HPI Description: ADMISSION 02/23/2020 Patient is a 77 year old man who lives in [place] who arrives accompanied by his wife. He has a history of chronic lymphedema and venous insufficiency in his bilateral lower legs which may have something to do that with having a history of DVT as well as being treated for prostate cancer. In any case he recently got compression pumps at home but compliance has been an issue here. He has compression stockings however they are probably  Per Week/30 Days Discharge Instructions: May shower and wash wound with dial antibacterial soap and water prior to dressing change. Peri-Wound Care: Sween Lotion (Moisturizing lotion) 1 x Per Week/30 Days Discharge Instructions: Apply moisturizing lotion as directed Prim Dressing: Aquacel Ag 1 x Per Week/30 Days ary Discharge Instructions: apply directly to wound bed. Secondary Dressing: ABD Pad, 8x10 1 x Per  Week/30 Days Discharge Instructions: Apply over primary dressing as directed. Secondary Dressing: OptiLock Super Absorbent, 5x5.5 (in/in) (Generic) 1 x Per Week/30 Days Discharge Instructions: Apply directly to wound bed as directed Compression Wrap: Urgo K2, (equivalent to a 4 layer) two layer compression system, regular 1 x Per Week/30 Days Discharge Instructions: Apply Urgo K2 as directed (alternative to 4 layer compression). Mirelez, [name] [name] ([id number]) [id number] Page 10 of 17 Electronic Signature(s) Signed: 03/04/2023 4:26:00 PM By: [name] Derry PA-C Signed: 03/04/2023 5:01:16 PM By: [name] RN Entered By: Karie [name] on 03/04/2023 13:02:57 -------------------------------------------------------------------------------- Problem List Details Patient Name: Date of Service: [name] [name] [name] 03/04/2023 3:00 PM Medical Record Number: [id number] Patient Account Number: [id number] Date of Birth/Sex: Treating RN: 08/17/1945 (77 y.o. 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 of 06/11/2022 No Yes bilateral lower extremity I89.0 Lymphedema, not elsewhere classified 03/26/2022 No Yes L97.828 Non-pressure chronic ulcer of other part of left lower leg with other specified 03/26/2022 No Yes severity L97.818 Non-pressure chronic ulcer of other part of right lower leg with other specified 03/26/2022 No Yes severity L84 Corns and callosities 10/15/2022 No Yes Inactive Problems Resolved Problems Electronic Signature(s) Signed: 03/04/2023 3:18:28 PM By: [name] Derry PA-C Entered By: [name] [name] on 03/04/2023  12:18:27 -------------------------------------------------------------------------------- Progress Note Details Patient Name: Date of Service: [name] [name] [name] 03/04/2023 3:00 PM Medical Record Number: [id number] Patient Account Number: [id number] Date of Birth/Sex: Treating RN: 11/13/1945 (77 y.o. M) Pitera, [name] [name] ([id number]) [id number] Page 11 of 17 Primary Care Provider: Ricki [name] Other Clinician: Referring Provider: Treating Provider/Extender: [name] [name] Weeks in Treatment: 36 Subjective Chief Complaint Information obtained from Patient 02/23/2020; patient is here for wounds on his bilateral lower legs in the setting of severe lymphedema 03/26/2022; patient is here for wounds on his bilateral lower legs medial aspect History of Present Illness (HPI) ADMISSION 02/23/2020 Patient is a 77 year old man who lives in [place] who arrives accompanied by his wife. He has a history of chronic lymphedema and venous insufficiency in his bilateral lower legs which may have something to do that with having a history of DVT as well as being treated for prostate cancer. In any case he recently got compression pumps at home but compliance has been an issue here. He has compression stockings however they are probably not sufficient enough to control swelling. They tell us that things deteriorated for him in late August he was admitted to [facility] for 7 days. This was with cellulitis I think of his bilateral lower legs. Discharge he was noted to have wounds on his bilateral lower legs. He was discharged on Bactrim. They tried to get him home health through [facility] of course they declined him. His wife is been wrapping these applying some form of silver foam dressing. He has a history of wounds before although nothing that would not heal with basic home topical dressings. He  Per Week/30 Days Discharge Instructions: May shower and wash wound with dial antibacterial soap and water prior to dressing change. Peri-Wound Care: Sween Lotion (Moisturizing lotion) 1 x Per Week/30 Days Discharge Instructions: Apply moisturizing lotion as directed Prim Dressing: Aquacel Ag 1 x Per Week/30 Days ary Discharge Instructions: apply directly to wound bed. Secondary Dressing: ABD Pad, 8x10 1 x Per  Week/30 Days Discharge Instructions: Apply over primary dressing as directed. Secondary Dressing: OptiLock Super Absorbent, 5x5.5 (in/in) (Generic) 1 x Per Week/30 Days Discharge Instructions: Apply directly to wound bed as directed Compression Wrap: Urgo K2, (equivalent to a 4 layer) two layer compression system, regular 1 x Per Week/30 Days Discharge Instructions: Apply Urgo K2 as directed (alternative to 4 layer compression). Mirelez, [name] [name] ([id number]) [id number] Page 10 of 17 Electronic Signature(s) Signed: 03/04/2023 4:26:00 PM By: [name] Derry PA-C Signed: 03/04/2023 5:01:16 PM By: [name] RN Entered By: Karie [name] on 03/04/2023 13:02:57 -------------------------------------------------------------------------------- Problem List Details Patient Name: Date of Service: [name] [name] [name] 03/04/2023 3:00 PM Medical Record Number: [id number] Patient Account Number: [id number] Date of Birth/Sex: Treating RN: 08/17/1945 (77 y.o. 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 of 06/11/2022 No Yes bilateral lower extremity I89.0 Lymphedema, not elsewhere classified 03/26/2022 No Yes L97.828 Non-pressure chronic ulcer of other part of left lower leg with other specified 03/26/2022 No Yes severity L97.818 Non-pressure chronic ulcer of other part of right lower leg with other specified 03/26/2022 No Yes severity L84 Corns and callosities 10/15/2022 No Yes Inactive Problems Resolved Problems Electronic Signature(s) Signed: 03/04/2023 3:18:28 PM By: [name] Derry PA-C Entered By: [name] [name] on 03/04/2023  12:18:27 -------------------------------------------------------------------------------- Progress Note Details Patient Name: Date of Service: [name] [name] [name] 03/04/2023 3:00 PM Medical Record Number: [id number] Patient Account Number: [id number] Date of Birth/Sex: Treating RN: 11/13/1945 (77 y.o. M) Pitera, [name] [name] ([id number]) [id number] Page 11 of 17 Primary Care Provider: Ricki [name] Other Clinician: Referring Provider: Treating Provider/Extender: [name] [name] Weeks in Treatment: 36 Subjective Chief Complaint Information obtained from Patient 02/23/2020; patient is here for wounds on his bilateral lower legs in the setting of severe lymphedema 03/26/2022; patient is here for wounds on his bilateral lower legs medial aspect History of Present Illness (HPI) ADMISSION 02/23/2020 Patient is a 77 year old man who lives in [place] who arrives accompanied by his wife. He has a history of chronic lymphedema and venous insufficiency in his bilateral lower legs which may have something to do that with having a history of DVT as well as being treated for prostate cancer. In any case he recently got compression pumps at home but compliance has been an issue here. He has compression stockings however they are probably not sufficient enough to control swelling. They tell us that things deteriorated for him in late August he was admitted to [facility] for 7 days. This was with cellulitis I think of his bilateral lower legs. Discharge he was noted to have wounds on his bilateral lower legs. He was discharged on Bactrim. They tried to get him home health through [facility] part [facility] of course they declined him. His wife is been wrapping these applying some form of silver foam dressing. He has a history of wounds before although nothing that would not heal with basic home topical dressings. He  today. He is less weeping and the legs are actually significantly smaller. He tells me has been trying to stay in bed with his legs elevated more. 02-25-2023 upon evaluation today patient appears to be doing well currently in regard to his legs in general compared to where we have been. Fortunately I do not see any signs of active infection locally or systemically which is great news and in general I do believe that we are making good headway here towards complete closure. I am hopeful that he will continue to show signs of improvement as we progress going forward. He does seem to have been taking his fluid pills as well as  elevating his legs and using lymphedema pumps on a regular basis. He is trying to stay out of the hospital. 03-04-2023 upon evaluation today patient appears to be doing well currently in regard to his wound. He has been tolerating the dressing changes without complication. Fortunately there does not appear to be any signs of active infection at this time. Electronic Signature(s) Signed: 03/04/2023 4:17:16 PM By: [name] Derry PA-C Entered By: [name] [name] on 03/04/2023 13:17:16 -------------------------------------------------------------------------------- Physical Exam Details Patient Name: Date of Service: [name] [name] [name] 03/04/2023 3:00 PM Medical Record Number: [id number] Patient Account Number: [id number] Date of Birth/Sex: Treating RN: 10-05-1945 (77 y.o. M) Primary Care Provider: Ricki [name] Other Clinician: Referring Provider: Treating Provider/Extender: [name] [name] in Treatment: 58 Level, [name] [name] ([id number]) [id number] Page 8 of 17 Constitutional Well-nourished and well-hydrated in no acute distress. Respiratory normal breathing without difficulty. Psychiatric this patient is able to make decisions and demonstrates good insight into disease process. Alert and Oriented x 3. pleasant and cooperative. Notes Upon inspection patient's wound bed actually showed signs of good granulation and epithelization at this point. Actually feel like he is making good headway towards closure and very pleased with where we stand I do believe that he is doing better compared to where we were previous. Electronic Signature(s) Signed: 03/04/2023 4:17:32 PM By: [name] Derry PA-C Entered By: [name] [name] on 03/04/2023 13:17:32 -------------------------------------------------------------------------------- Physician Orders Details Patient Name: Date of Service: [name] [name] [name] 03/04/2023 3:00 PM Medical Record Number: [id number] Patient  Account Number: [id number] Date of Birth/Sex: Treating RN: 08/01/1945 (77 y.o. [name] [name] Primary Care Provider: Ricki [name] Other Clinician: Referring Provider: Treating Provider/Extender: [name] [name] in Treatment: 16 Verbal / Phone Orders: No Diagnosis Coding ICD-10 Coding Code Description [phone] Chronic venous hypertension (idiopathic) with ulcer and inflammation of bilateral lower extremity I89.0 Lymphedema, not elsewhere classified 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.818 Non-pressure chronic ulcer of other part of right lower leg with other specified severity L84 Corns and callosities Follow-up Appointments ppointment in 1 week. [name] Derry PA Wednesday ****extra time 60 minutes**** 03/11/2023 3pm Return A ppointment in 2 weeks. [name] Derry PA (already scheduled) Return A Return appointment in 3 weeks. - 03/18/2023 3pm Jermiya Reichl (already scheduled) Return appointment in 1 month. - Please see front desk to schedule appointment Anesthetic (In clinic) Topical Lidocaine 5% applied to wound bed Bathing/ Shower/ Hygiene May shower with protection but do not get wound dressing(s) wet. Protect dressing(s) with water repellant cover (for example, large plastic bag) or a cast cover and may then take shower. Edema Control - Lymphedema / SCD / Other Lymphedema Pumps. Use Lymphedema pumps on leg(s) 2-3 times a day for 45-60 minutes. If wearing any wraps  Date of Service: [name] [name] [name] 03/04/2023 3:00 PM Medical Record Number: [id number] Patient Account Number: [id number] Date of Birth/Sex: Treating RN: [birth year] (77 y.o. [name] [name] Primary Care Provider: Ricki [name] Other Clinician: Referring Provider: Treating Provider/Extender: [name] [name] Weeks in Treatment: 57 Debridement Performed for Assessment: Wound #6 Right,Medial Lower Leg Performed By: Physician [name] Kelp, PA The following information was scribed by: Karie [name] The information was scribed for: [name] [name] Debridement Type: Debridement Level of Consciousness (Pre-procedure): Awake and Alert Pre-procedure Verification/Time Out Yes - 15:47 Taken: Start Time: 15:47 Pain Control: Lidocaine 4% T opical Solution Percent of Wound Bed Debrided: 100% T Area Debrided (cm): otal 28.26 Tissue and other material debrided: Viable, Non-Viable, Slough, Subcutaneous, Slough Level: Skin/Subcutaneous Tissue Debridement Description: Excisional Instrument: Curette Bleeding: Minimum Hemostasis Achieved:  Pressure End Time: 15:52 Procedural Pain: 0 Post Procedural Pain: 0 Response to Treatment: Procedure was tolerated well Level of Consciousness (Post- Awake and Alert procedure): Post Debridement Measurements of Total Wound Length: (cm) 8 Width: (cm) 4.5 Depth: (cm) 0.5 Volume: (cm) 14.137 Character of Wound/Ulcer Post Debridement: Improved Post Procedure Diagnosis Same as Pre-procedure Electronic Signature(s) Signed: 03/04/2023 4:26:00 PM By: [name] Derry PA-C Signed: 03/04/2023 5:01:16 PM By: [name] RN Entered By: Karie [name] on 03/04/2023 13:03:58 -------------------------------------------------------------------------------- Debridement Details Patient Name: Date of Service: [name] [name] [name] 03/04/2023 3:00 PM Medical Record Number: [id number] Patient Account Number: [id number] Date of Birth/Sex: Treating RN: [birth year] (77 y.o. [name] [name] Primary Care Provider: Ricki [name] Other Clinician: Referring Provider: Treating Provider/Extender: [name] [name] Weeks in Treatment: 61 Debridement Performed for Assessment: Wound #7 Left,Medial Lower Leg Performed By: Physician [name] Kelp, PA The following information was scribed by: Karie [name] The information was scribed for: [name] [name] Debridement Type: Debridement [name] [name] [name] ([id number]) [id number] Page 4 of 17 Level of Consciousness (Pre-procedure): Awake and Alert Pre-procedure Verification/Time Out Yes - 15:47 Taken: Start Time: 15:47 Pain Control: Lidocaine 4% T opical Solution Percent of Wound Bed Debrided: 100% T Area Debrided (cm): otal 9.89 Tissue and other material debrided: Viable, Non-Viable, Slough, Subcutaneous, Slough Level: Skin/Subcutaneous Tissue Debridement Description: Excisional Instrument: Curette Bleeding: Minimum Hemostasis Achieved: Pressure End Time: 15:52 Procedural Pain: 0 Post Procedural Pain: 0 Response  to Treatment: Procedure was tolerated well Level of Consciousness (Post- Awake and Alert procedure): Post Debridement Measurements of Total Wound Length: (cm) 3 Width: (cm) 4.2 Depth: (cm) 0.2 Volume: (cm) 1.979 Character of Wound/Ulcer Post Debridement: Improved Post Procedure Diagnosis Same as Pre-procedure Electronic Signature(s) Signed: 03/04/2023 4:26:00 PM By: [name] Derry PA-C Signed: 03/04/2023 5:01:16 PM By: [name] RN Entered By: Karie [name] on 03/04/2023 13:04:21 -------------------------------------------------------------------------------- HPI Details Patient Name: Date of Service: [name] [name] [name] 03/04/2023 3:00 PM Medical Record Number: [id number] Patient Account Number: [id number] Date of Birth/Sex: Treating RN: [birth year] (77 y.o. M) Primary Care Provider: Ricki [name] Other Clinician: Referring Provider: Treating Provider/Extender: [name] [name] Weeks in Treatment: 37 History of Present Illness HPI Description: ADMISSION 02/23/2020 Patient is a 77 year old man who lives in [place] who arrives accompanied by his wife. He has a history of chronic lymphedema and venous insufficiency in his bilateral lower legs which may have something to do that with having a history of DVT as well as being treated for prostate cancer. In any case he recently got compression pumps at home but compliance has been an issue here. He has compression stockings however they are probably  Hasting, [name] [name] ([id number]) [id number] Page 1 of 17 Visit Report for 03/04/2023 Chief Complaint Document Details Patient Name: Date of Service: [name] [name] [name] 03/04/2023 3:00 PM Medical Record Number: [id number] Patient Account Number: [id number] Date of Birth/Sex: Treating RN: 19-Feb-1946 (77 y.o. M) Primary Care Provider: Ricki [name] Other Clinician: Referring Provider: Treating Provider/Extender: [name] [name] Weeks in Treatment: 70 Information Obtained from: Patient Chief Complaint 02/23/2020; patient is here for wounds on his bilateral lower legs in the setting of severe lymphedema 03/26/2022; patient is here for wounds on his bilateral lower legs medial aspect Electronic Signature(s) Signed: 03/04/2023 3:18:35 PM By: [name] Derry PA-C Entered By: [name] [name] on 03/04/2023 12:18:35 -------------------------------------------------------------------------------- Debridement Details Patient Name: Date of Service: [name] [name] [name] 03/04/2023 3:00 PM Medical Record Number: [id number] Patient Account Number: [id number] Date of Birth/Sex: Treating RN: 1945/06/15 (77 y.o. [name] [name] Primary Care Provider: Ricki [name] Other Clinician: Referring Provider: Treating Provider/Extender: [name] [name] Weeks in Treatment: 67 Debridement Performed for Assessment: Wound #12 Left,Anterior Lower Leg Performed By: Physician [name] Kelp, PA The following information was scribed by: Karie [name] The information was scribed for: [name] [name] Debridement Type: Debridement Level of Consciousness (Pre-procedure): Awake and Alert Pre-procedure Verification/Time Out Yes - 15:47 Taken: Start Time: 15:47 Pain Control: Lidocaine 4% T opical Solution Percent of Wound Bed Debrided: 100% T Area Debrided (cm): otal 0.39 Tissue and other material debrided: Viable, Non-Viable, Slough, Subcutaneous, Slough Level:  Skin/Subcutaneous Tissue Debridement Description: Excisional Instrument: Curette Bleeding: Minimum Hemostasis Achieved: Pressure End Time: 15:52 Procedural Pain: 0 Post Procedural Pain: 0 Response to Treatment: Procedure was tolerated well Level of Consciousness (Post- Awake and Alert procedure): Post Debridement Measurements of Total Wound Length: (cm) 1 Width: (cm) 0.5 Depth: (cm) 0.1 [name] [name] ([id number]) [id number] Page 2 of 17 Volume: (cm) 0.039 Character of Wound/Ulcer Post Debridement: Improved Post Procedure Diagnosis Same as Pre-procedure Electronic Signature(s) Signed: 03/04/2023 4:26:00 PM By: [name] Derry PA-C Signed: 03/04/2023 5:01:16 PM By: [name] RN Entered By: Karie [name] on 03/04/2023 13:03:21 -------------------------------------------------------------------------------- Debridement Details Patient Name: Date of Service: [name] [name] [name] 03/04/2023 3:00 PM Medical Record Number: [id number] Patient Account Number: [id number] Date of Birth/Sex: Treating RN: Aug 09, 1945 (77 y.o. [name] [name] Primary Care Provider: Ricki [name] Other Clinician: Referring Provider: Treating Provider/Extender: [name] [name] Weeks in Treatment: 14 Debridement Performed for Assessment: Wound #15 Left,Distal,Posterior Lower Leg Performed By: Physician [name] Kelp, PA The following information was scribed by: Karie [name] The information was scribed for: [name] [name] Debridement Type: Debridement Level of Consciousness (Pre-procedure): Awake and Alert Pre-procedure Verification/Time Out Yes - 15:47 Taken: Start Time: 15:47 Pain Control: Lidocaine 4% T opical Solution Percent of Wound Bed Debrided: 100% T Area Debrided (cm): otal 4.12 Tissue and other material debrided: Viable, Non-Viable, Slough, Subcutaneous, Slough Level: Skin/Subcutaneous Tissue Debridement Description:  Excisional Instrument: Curette Bleeding: Minimum Hemostasis Achieved: Pressure End Time: 15:52 Procedural Pain: 0 Post Procedural Pain: 0 Response to Treatment: Procedure was tolerated well Level of Consciousness (Post- Awake and Alert procedure): Post Debridement Measurements of Total Wound Length: (cm) 3.5 Width: (cm) 1.5 Depth: (cm) 0.1 Volume: (cm) 0.412 Character of Wound/Ulcer Post Debridement: Improved Post Procedure Diagnosis Same as Pre-procedure Electronic Signature(s) Signed: 03/04/2023 4:26:00 PM By: [name] Derry PA-C Signed: 03/04/2023 5:01:16 PM By: [name] RN Entered By: Karie [name] on 03/04/2023 13:03:37 Burchill, [name] [name] ([id number]) [id number] Page 3 of 17 -------------------------------------------------------------------------------- Debridement Details Patient Name:  today. He is less weeping and the legs are actually significantly smaller. He tells me has been trying to stay in bed with his legs elevated more. 02-25-2023 upon evaluation today patient appears to be doing well currently in regard to his legs in general compared to where we have been. Fortunately I do not see any signs of active infection locally or systemically which is great news and in general I do believe that we are making good headway here towards complete closure. I am hopeful that he will continue to show signs of improvement as we progress going forward. He does seem to have been taking his fluid pills as well as  elevating his legs and using lymphedema pumps on a regular basis. He is trying to stay out of the hospital. 03-04-2023 upon evaluation today patient appears to be doing well currently in regard to his wound. He has been tolerating the dressing changes without complication. Fortunately there does not appear to be any signs of active infection at this time. Electronic Signature(s) Signed: 03/04/2023 4:17:16 PM By: [name] Derry PA-C Entered By: [name] [name] on 03/04/2023 13:17:16 -------------------------------------------------------------------------------- Physical Exam Details Patient Name: Date of Service: [name] [name] [name] 03/04/2023 3:00 PM Medical Record Number: [id number] Patient Account Number: [id number] Date of Birth/Sex: Treating RN: 10-05-1945 (77 y.o. M) Primary Care Provider: Ricki [name] Other Clinician: Referring Provider: Treating Provider/Extender: [name] [name] in Treatment: 58 Level, [name] [name] ([id number]) [id number] Page 8 of 17 Constitutional Well-nourished and well-hydrated in no acute distress. Respiratory normal breathing without difficulty. Psychiatric this patient is able to make decisions and demonstrates good insight into disease process. Alert and Oriented x 3. pleasant and cooperative. Notes Upon inspection patient's wound bed actually showed signs of good granulation and epithelization at this point. Actually feel like he is making good headway towards closure and very pleased with where we stand I do believe that he is doing better compared to where we were previous. Electronic Signature(s) Signed: 03/04/2023 4:17:32 PM By: [name] Derry PA-C Entered By: [name] [name] on 03/04/2023 13:17:32 -------------------------------------------------------------------------------- Physician Orders Details Patient Name: Date of Service: [name] [name] [name] 03/04/2023 3:00 PM Medical Record Number: [id number] Patient  Account Number: [id number] Date of Birth/Sex: Treating RN: 08/01/1945 (77 y.o. [name] [name] Primary Care Provider: Ricki [name] Other Clinician: Referring Provider: Treating Provider/Extender: [name] [name] in Treatment: 16 Verbal / Phone Orders: No Diagnosis Coding ICD-10 Coding Code Description [phone] Chronic venous hypertension (idiopathic) with ulcer and inflammation of bilateral lower extremity I89.0 Lymphedema, not elsewhere classified 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.818 Non-pressure chronic ulcer of other part of right lower leg with other specified severity L84 Corns and callosities Follow-up Appointments ppointment in 1 week. [name] Derry PA Wednesday ****extra time 60 minutes**** 03/11/2023 3pm Return A ppointment in 2 weeks. [name] Derry PA (already scheduled) Return A Return appointment in 3 weeks. - 03/18/2023 3pm Jermiya Reichl (already scheduled) Return appointment in 1 month. - Please see front desk to schedule appointment Anesthetic (In clinic) Topical Lidocaine 5% applied to wound bed Bathing/ Shower/ Hygiene May shower with protection but do not get wound dressing(s) wet. Protect dressing(s) with water repellant cover (for example, large plastic bag) or a cast cover and may then take shower. Edema Control - Lymphedema / SCD / Other Lymphedema Pumps. Use Lymphedema pumps on leg(s) 2-3 times a day for 45-60 minutes. If wearing any wraps  Hasting, [name] [name] ([id number]) [id number] Page 1 of 17 Visit Report for 03/04/2023 Chief Complaint Document Details Patient Name: Date of Service: [name] [name] [name] 03/04/2023 3:00 PM Medical Record Number: [id number] Patient Account Number: [id number] Date of Birth/Sex: Treating RN: 19-Feb-1946 (77 y.o. M) Primary Care Provider: Ricki [name] Other Clinician: Referring Provider: Treating Provider/Extender: [name] [name] Weeks in Treatment: 70 Information Obtained from: Patient Chief Complaint 02/23/2020; patient is here for wounds on his bilateral lower legs in the setting of severe lymphedema 03/26/2022; patient is here for wounds on his bilateral lower legs medial aspect Electronic Signature(s) Signed: 03/04/2023 3:18:35 PM By: [name] Derry PA-C Entered By: [name] [name] on 03/04/2023 12:18:35 -------------------------------------------------------------------------------- Debridement Details Patient Name: Date of Service: [name] [name] [name] 03/04/2023 3:00 PM Medical Record Number: [id number] Patient Account Number: [id number] Date of Birth/Sex: Treating RN: 1945/06/15 (77 y.o. [name] [name] Primary Care Provider: Ricki [name] Other Clinician: Referring Provider: Treating Provider/Extender: [name] [name] Weeks in Treatment: 67 Debridement Performed for Assessment: Wound #12 Left,Anterior Lower Leg Performed By: Physician [name] Kelp, PA The following information was scribed by: Karie [name] The information was scribed for: [name] [name] Debridement Type: Debridement Level of Consciousness (Pre-procedure): Awake and Alert Pre-procedure Verification/Time Out Yes - 15:47 Taken: Start Time: 15:47 Pain Control: Lidocaine 4% T opical Solution Percent of Wound Bed Debrided: 100% T Area Debrided (cm): otal 0.39 Tissue and other material debrided: Viable, Non-Viable, Slough, Subcutaneous, Slough Level:  Skin/Subcutaneous Tissue Debridement Description: Excisional Instrument: Curette Bleeding: Minimum Hemostasis Achieved: Pressure End Time: 15:52 Procedural Pain: 0 Post Procedural Pain: 0 Response to Treatment: Procedure was tolerated well Level of Consciousness (Post- Awake and Alert procedure): Post Debridement Measurements of Total Wound Length: (cm) 1 Width: (cm) 0.5 Depth: (cm) 0.1 [name] [name] ([id number]) [id number] Page 2 of 17 Volume: (cm) 0.039 Character of Wound/Ulcer Post Debridement: Improved Post Procedure Diagnosis Same as Pre-procedure Electronic Signature(s) Signed: 03/04/2023 4:26:00 PM By: [name] Derry PA-C Signed: 03/04/2023 5:01:16 PM By: [name] RN Entered By: Karie [name] on 03/04/2023 13:03:21 -------------------------------------------------------------------------------- Debridement Details Patient Name: Date of Service: [name] [name] [name] 03/04/2023 3:00 PM Medical Record Number: [id number] Patient Account Number: [id number] Date of Birth/Sex: Treating RN: Aug 09, 1945 (77 y.o. [name] [name] Primary Care Provider: Ricki [name] Other Clinician: Referring Provider: Treating Provider/Extender: [name] [name] Weeks in Treatment: 14 Debridement Performed for Assessment: Wound #15 Left,Distal,Posterior Lower Leg Performed By: Physician [name] Kelp, PA The following information was scribed by: Karie [name] The information was scribed for: [name] [name] Debridement Type: Debridement Level of Consciousness (Pre-procedure): Awake and Alert Pre-procedure Verification/Time Out Yes - 15:47 Taken: Start Time: 15:47 Pain Control: Lidocaine 4% T opical Solution Percent of Wound Bed Debrided: 100% T Area Debrided (cm): otal 4.12 Tissue and other material debrided: Viable, Non-Viable, Slough, Subcutaneous, Slough Level: Skin/Subcutaneous Tissue Debridement Description:  Excisional Instrument: Curette Bleeding: Minimum Hemostasis Achieved: Pressure End Time: 15:52 Procedural Pain: 0 Post Procedural Pain: 0 Response to Treatment: Procedure was tolerated well Level of Consciousness (Post- Awake and Alert procedure): Post Debridement Measurements of Total Wound Length: (cm) 3.5 Width: (cm) 1.5 Depth: (cm) 0.1 Volume: (cm) 0.412 Character of Wound/Ulcer Post Debridement: Improved Post Procedure Diagnosis Same as Pre-procedure Electronic Signature(s) Signed: 03/04/2023 4:26:00 PM By: [name] Derry PA-C Signed: 03/04/2023 5:01:16 PM By: [name] RN Entered By: Karie [name] on 03/04/2023 13:03:37 Burchill, [name] [name] ([id number]) [id number] Page 3 of 17 -------------------------------------------------------------------------------- Debridement Details Patient Name:  Per Week/30 Days Discharge Instructions: May shower and wash wound with dial antibacterial soap and water prior to dressing change. Peri-Wound Care: Sween Lotion (Moisturizing lotion) 1 x Per Week/30 Days Discharge Instructions: Apply moisturizing lotion as directed Prim Dressing: Aquacel Ag 1 x Per Week/30 Days ary Discharge Instructions: apply directly to wound bed. Secondary Dressing: ABD Pad, 8x10 1 x Per  Week/30 Days Discharge Instructions: Apply over primary dressing as directed. Secondary Dressing: OptiLock Super Absorbent, 5x5.5 (in/in) (Generic) 1 x Per Week/30 Days Discharge Instructions: Apply directly to wound bed as directed Compression Wrap: Urgo K2, (equivalent to a 4 layer) two layer compression system, regular 1 x Per Week/30 Days Discharge Instructions: Apply Urgo K2 as directed (alternative to 4 layer compression). Mirelez, [name] [name] ([id number]) [id number] Page 10 of 17 Electronic Signature(s) Signed: 03/04/2023 4:26:00 PM By: [name] Derry PA-C Signed: 03/04/2023 5:01:16 PM By: [name] RN Entered By: Karie [name] on 03/04/2023 13:02:57 -------------------------------------------------------------------------------- Problem List Details Patient Name: Date of Service: [name] [name] [name] 03/04/2023 3:00 PM Medical Record Number: [id number] Patient Account Number: [id number] Date of Birth/Sex: Treating RN: 08/17/1945 (77 y.o. 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 of 06/11/2022 No Yes bilateral lower extremity I89.0 Lymphedema, not elsewhere classified 03/26/2022 No Yes L97.828 Non-pressure chronic ulcer of other part of left lower leg with other specified 03/26/2022 No Yes severity L97.818 Non-pressure chronic ulcer of other part of right lower leg with other specified 03/26/2022 No Yes severity L84 Corns and callosities 10/15/2022 No Yes Inactive Problems Resolved Problems Electronic Signature(s) Signed: 03/04/2023 3:18:28 PM By: [name] Derry PA-C Entered By: [name] [name] on 03/04/2023  12:18:27 -------------------------------------------------------------------------------- Progress Note Details Patient Name: Date of Service: [name] [name] [name] 03/04/2023 3:00 PM Medical Record Number: [id number] Patient Account Number: [id number] Date of Birth/Sex: Treating RN: 11/13/1945 (77 y.o. M) Pitera, [name] [name] ([id number]) [id number] Page 11 of 17 Primary Care Provider: Ricki [name] Other Clinician: Referring Provider: Treating Provider/Extender: [name] [name] Weeks in Treatment: 36 Subjective Chief Complaint Information obtained from Patient 02/23/2020; patient is here for wounds on his bilateral lower legs in the setting of severe lymphedema 03/26/2022; patient is here for wounds on his bilateral lower legs medial aspect History of Present Illness (HPI) ADMISSION 02/23/2020 Patient is a 77 year old man who lives in [place] who arrives accompanied by his wife. He has a history of chronic lymphedema and venous insufficiency in his bilateral lower legs which may have something to do that with having a history of DVT as well as being treated for prostate cancer. In any case he recently got compression pumps at home but compliance has been an issue here. He has compression stockings however they are probably not sufficient enough to control swelling. They tell us that things deteriorated for him in late August he was admitted to [facility] for 7 days. This was with cellulitis I think of his bilateral lower legs. Discharge he was noted to have wounds on his bilateral lower legs. He was discharged on Bactrim. They tried to get him home health through [facility] of course they declined him. His wife is been wrapping these applying some form of silver foam dressing. He has a history of wounds before although nothing that would not heal with basic home topical dressings. He  today. He is less weeping and the legs are actually significantly smaller. He tells me has been trying to stay in bed with his legs elevated more. 02-25-2023 upon evaluation today patient appears to be doing well currently in regard to his legs in general compared to where we have been. Fortunately I do not see any signs of active infection locally or systemically which is great news and in general I do believe that we are making good headway here towards complete closure. I am hopeful that he will continue to show signs of improvement as we progress going forward. He does seem to have been taking his fluid pills as well as  elevating his legs and using lymphedema pumps on a regular basis. He is trying to stay out of the hospital. 03-04-2023 upon evaluation today patient appears to be doing well currently in regard to his wound. He has been tolerating the dressing changes without complication. Fortunately there does not appear to be any signs of active infection at this time. Electronic Signature(s) Signed: 03/04/2023 4:17:16 PM By: [name] Derry PA-C Entered By: [name] [name] on 03/04/2023 13:17:16 -------------------------------------------------------------------------------- Physical Exam Details Patient Name: Date of Service: [name] [name] [name] 03/04/2023 3:00 PM Medical Record Number: [id number] Patient Account Number: [id number] Date of Birth/Sex: Treating RN: 10-05-1945 (77 y.o. M) Primary Care Provider: Ricki [name] Other Clinician: Referring Provider: Treating Provider/Extender: [name] [name] in Treatment: 58 Level, [name] [name] ([id number]) [id number] Page 8 of 17 Constitutional Well-nourished and well-hydrated in no acute distress. Respiratory normal breathing without difficulty. Psychiatric this patient is able to make decisions and demonstrates good insight into disease process. Alert and Oriented x 3. pleasant and cooperative. Notes Upon inspection patient's wound bed actually showed signs of good granulation and epithelization at this point. Actually feel like he is making good headway towards closure and very pleased with where we stand I do believe that he is doing better compared to where we were previous. Electronic Signature(s) Signed: 03/04/2023 4:17:32 PM By: [name] Derry PA-C Entered By: [name] [name] on 03/04/2023 13:17:32 -------------------------------------------------------------------------------- Physician Orders Details Patient Name: Date of Service: [name] [name] [name] 03/04/2023 3:00 PM Medical Record Number: [id number] Patient  Account Number: [id number] Date of Birth/Sex: Treating RN: 08/01/1945 (77 y.o. [name] [name] Primary Care Provider: Ricki [name] Other Clinician: Referring Provider: Treating Provider/Extender: [name] [name] in Treatment: 16 Verbal / Phone Orders: No Diagnosis Coding ICD-10 Coding Code Description [phone] Chronic venous hypertension (idiopathic) with ulcer and inflammation of bilateral lower extremity I89.0 Lymphedema, not elsewhere classified 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.818 Non-pressure chronic ulcer of other part of right lower leg with other specified severity L84 Corns and callosities Follow-up Appointments ppointment in 1 week. [name] Derry PA Wednesday ****extra time 60 minutes**** 03/11/2023 3pm Return A ppointment in 2 weeks. [name] Derry PA (already scheduled) Return A Return appointment in 3 weeks. - 03/18/2023 3pm Jermiya Reichl (already scheduled) Return appointment in 1 month. - Please see front desk to schedule appointment Anesthetic (In clinic) Topical Lidocaine 5% applied to wound bed Bathing/ Shower/ Hygiene May shower with protection but do not get wound dressing(s) wet. Protect dressing(s) with water repellant cover (for example, large plastic bag) or a cast cover and may then take shower. Edema Control - Lymphedema / SCD / Other Lymphedema Pumps. Use Lymphedema pumps on leg(s) 2-3 times a day for 45-60 minutes. If wearing any wraps  Hasting, [name] [name] ([id number]) [id number] Page 1 of 17 Visit Report for 03/04/2023 Chief Complaint Document Details Patient Name: Date of Service: [name] [name] [name] 03/04/2023 3:00 PM Medical Record Number: [id number] Patient Account Number: [id number] Date of Birth/Sex: Treating RN: 19-Feb-1946 (77 y.o. M) Primary Care Provider: Ricki [name] Other Clinician: Referring Provider: Treating Provider/Extender: [name] [name] Weeks in Treatment: 70 Information Obtained from: Patient Chief Complaint 02/23/2020; patient is here for wounds on his bilateral lower legs in the setting of severe lymphedema 03/26/2022; patient is here for wounds on his bilateral lower legs medial aspect Electronic Signature(s) Signed: 03/04/2023 3:18:35 PM By: [name] Derry PA-C Entered By: [name] [name] on 03/04/2023 12:18:35 -------------------------------------------------------------------------------- Debridement Details Patient Name: Date of Service: [name] [name] [name] 03/04/2023 3:00 PM Medical Record Number: [id number] Patient Account Number: [id number] Date of Birth/Sex: Treating RN: 1945/06/15 (77 y.o. [name] [name] Primary Care Provider: Ricki [name] Other Clinician: Referring Provider: Treating Provider/Extender: [name] [name] Weeks in Treatment: 67 Debridement Performed for Assessment: Wound #12 Left,Anterior Lower Leg Performed By: Physician [name] Kelp, PA The following information was scribed by: Karie [name] The information was scribed for: [name] [name] Debridement Type: Debridement Level of Consciousness (Pre-procedure): Awake and Alert Pre-procedure Verification/Time Out Yes - 15:47 Taken: Start Time: 15:47 Pain Control: Lidocaine 4% T opical Solution Percent of Wound Bed Debrided: 100% T Area Debrided (cm): otal 0.39 Tissue and other material debrided: Viable, Non-Viable, Slough, Subcutaneous, Slough Level:  Skin/Subcutaneous Tissue Debridement Description: Excisional Instrument: Curette Bleeding: Minimum Hemostasis Achieved: Pressure End Time: 15:52 Procedural Pain: 0 Post Procedural Pain: 0 Response to Treatment: Procedure was tolerated well Level of Consciousness (Post- Awake and Alert procedure): Post Debridement Measurements of Total Wound Length: (cm) 1 Width: (cm) 0.5 Depth: (cm) 0.1 [name] [name] ([id number]) [id number] Page 2 of 17 Volume: (cm) 0.039 Character of Wound/Ulcer Post Debridement: Improved Post Procedure Diagnosis Same as Pre-procedure Electronic Signature(s) Signed: 03/04/2023 4:26:00 PM By: [name] Derry PA-C Signed: 03/04/2023 5:01:16 PM By: [name] RN Entered By: Karie [name] on 03/04/2023 13:03:21 -------------------------------------------------------------------------------- Debridement Details Patient Name: Date of Service: [name] [name] [name] 03/04/2023 3:00 PM Medical Record Number: [id number] Patient Account Number: [id number] Date of Birth/Sex: Treating RN: Aug 09, 1945 (77 y.o. [name] [name] Primary Care Provider: Ricki [name] Other Clinician: Referring Provider: Treating Provider/Extender: [name] [name] Weeks in Treatment: 14 Debridement Performed for Assessment: Wound #15 Left,Distal,Posterior Lower Leg Performed By: Physician [name] Kelp, PA The following information was scribed by: Karie [name] The information was scribed for: [name] [name] Debridement Type: Debridement Level of Consciousness (Pre-procedure): Awake and Alert Pre-procedure Verification/Time Out Yes - 15:47 Taken: Start Time: 15:47 Pain Control: Lidocaine 4% T opical Solution Percent of Wound Bed Debrided: 100% T Area Debrided (cm): otal 4.12 Tissue and other material debrided: Viable, Non-Viable, Slough, Subcutaneous, Slough Level: Skin/Subcutaneous Tissue Debridement Description:  Excisional Instrument: Curette Bleeding: Minimum Hemostasis Achieved: Pressure End Time: 15:52 Procedural Pain: 0 Post Procedural Pain: 0 Response to Treatment: Procedure was tolerated well Level of Consciousness (Post- Awake and Alert procedure): Post Debridement Measurements of Total Wound Length: (cm) 3.5 Width: (cm) 1.5 Depth: (cm) 0.1 Volume: (cm) 0.412 Character of Wound/Ulcer Post Debridement: Improved Post Procedure Diagnosis Same as Pre-procedure Electronic Signature(s) Signed: 03/04/2023 4:26:00 PM By: [name] Derry PA-C Signed: 03/04/2023 5:01:16 PM By: [name] RN Entered By: Karie [name] on 03/04/2023 13:03:37 Burchill, [name] [name] ([id number]) [id number] Page 3 of 17 -------------------------------------------------------------------------------- Debridement Details Patient Name:  today. He is less weeping and the legs are actually significantly smaller. He tells me has been trying to stay in bed with his legs elevated more. 02-25-2023 upon evaluation today patient appears to be doing well currently in regard to his legs in general compared to where we have been. Fortunately I do not see any signs of active infection locally or systemically which is great news and in general I do believe that we are making good headway here towards complete closure. I am hopeful that he will continue to show signs of improvement as we progress going forward. He does seem to have been taking his fluid pills as well as  elevating his legs and using lymphedema pumps on a regular basis. He is trying to stay out of the hospital. 03-04-2023 upon evaluation today patient appears to be doing well currently in regard to his wound. He has been tolerating the dressing changes without complication. Fortunately there does not appear to be any signs of active infection at this time. Electronic Signature(s) Signed: 03/04/2023 4:17:16 PM By: [name] Derry PA-C Entered By: [name] [name] on 03/04/2023 13:17:16 -------------------------------------------------------------------------------- Physical Exam Details Patient Name: Date of Service: [name] [name] [name] 03/04/2023 3:00 PM Medical Record Number: [id number] Patient Account Number: [id number] Date of Birth/Sex: Treating RN: 10-05-1945 (77 y.o. M) Primary Care Provider: Ricki [name] Other Clinician: Referring Provider: Treating Provider/Extender: [name] [name] in Treatment: 58 Level, [name] [name] ([id number]) [id number] Page 8 of 17 Constitutional Well-nourished and well-hydrated in no acute distress. Respiratory normal breathing without difficulty. Psychiatric this patient is able to make decisions and demonstrates good insight into disease process. Alert and Oriented x 3. pleasant and cooperative. Notes Upon inspection patient's wound bed actually showed signs of good granulation and epithelization at this point. Actually feel like he is making good headway towards closure and very pleased with where we stand I do believe that he is doing better compared to where we were previous. Electronic Signature(s) Signed: 03/04/2023 4:17:32 PM By: [name] Derry PA-C Entered By: [name] [name] on 03/04/2023 13:17:32 -------------------------------------------------------------------------------- Physician Orders Details Patient Name: Date of Service: [name] [name] [name] 03/04/2023 3:00 PM Medical Record Number: [id number] Patient  Account Number: [id number] Date of Birth/Sex: Treating RN: 08/01/1945 (77 y.o. [name] [name] Primary Care Provider: Ricki [name] Other Clinician: Referring Provider: Treating Provider/Extender: [name] [name] in Treatment: 16 Verbal / Phone Orders: No Diagnosis Coding ICD-10 Coding Code Description [phone] Chronic venous hypertension (idiopathic) with ulcer and inflammation of bilateral lower extremity I89.0 Lymphedema, not elsewhere classified 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.818 Non-pressure chronic ulcer of other part of right lower leg with other specified severity L84 Corns and callosities Follow-up Appointments ppointment in 1 week. [name] Derry PA Wednesday ****extra time 60 minutes**** 03/11/2023 3pm Return A ppointment in 2 weeks. [name] Derry PA (already scheduled) Return A Return appointment in 3 weeks. - 03/18/2023 3pm Jermiya Reichl (already scheduled) Return appointment in 1 month. - Please see front desk to schedule appointment Anesthetic (In clinic) Topical Lidocaine 5% applied to wound bed Bathing/ Shower/ Hygiene May shower with protection but do not get wound dressing(s) wet. Protect dressing(s) with water repellant cover (for example, large plastic bag) or a cast cover and may then take shower. Edema Control - Lymphedema / SCD / Other Lymphedema Pumps. Use Lymphedema pumps on leg(s) 2-3 times a day for 45-60 minutes. If wearing any wraps  Per Week/30 Days Discharge Instructions: May shower and wash wound with dial antibacterial soap and water prior to dressing change. Peri-Wound Care: Sween Lotion (Moisturizing lotion) 1 x Per Week/30 Days Discharge Instructions: Apply moisturizing lotion as directed Prim Dressing: Aquacel Ag 1 x Per Week/30 Days ary Discharge Instructions: apply directly to wound bed. Secondary Dressing: ABD Pad, 8x10 1 x Per  Week/30 Days Discharge Instructions: Apply over primary dressing as directed. Secondary Dressing: OptiLock Super Absorbent, 5x5.5 (in/in) (Generic) 1 x Per Week/30 Days Discharge Instructions: Apply directly to wound bed as directed Compression Wrap: Urgo K2, (equivalent to a 4 layer) two layer compression system, regular 1 x Per Week/30 Days Discharge Instructions: Apply Urgo K2 as directed (alternative to 4 layer compression). Mirelez, [name] [name] ([id number]) [id number] Page 10 of 17 Electronic Signature(s) Signed: 03/04/2023 4:26:00 PM By: [name] Derry PA-C Signed: 03/04/2023 5:01:16 PM By: [name] RN Entered By: Karie [name] on 03/04/2023 13:02:57 -------------------------------------------------------------------------------- Problem List Details Patient Name: Date of Service: [name] [name] [name] 03/04/2023 3:00 PM Medical Record Number: [id number] Patient Account Number: [id number] Date of Birth/Sex: Treating RN: 08/17/1945 (77 y.o. 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 of 06/11/2022 No Yes bilateral lower extremity I89.0 Lymphedema, not elsewhere classified 03/26/2022 No Yes L97.828 Non-pressure chronic ulcer of other part of left lower leg with other specified 03/26/2022 No Yes severity L97.818 Non-pressure chronic ulcer of other part of right lower leg with other specified 03/26/2022 No Yes severity L84 Corns and callosities 10/15/2022 No Yes Inactive Problems Resolved Problems Electronic Signature(s) Signed: 03/04/2023 3:18:28 PM By: [name] Derry PA-C Entered By: [name] [name] on 03/04/2023  12:18:27 -------------------------------------------------------------------------------- Progress Note Details Patient Name: Date of Service: [name] [name] [name] 03/04/2023 3:00 PM Medical Record Number: [id number] Patient Account Number: [id number] Date of Birth/Sex: Treating RN: 11/13/1945 (77 y.o. M) Pitera, [name] [name] ([id number]) [id number] Page 11 of 17 Primary Care Provider: Ricki [name] Other Clinician: Referring Provider: Treating Provider/Extender: [name] [name] Weeks in Treatment: 36 Subjective Chief Complaint Information obtained from Patient 02/23/2020; patient is here for wounds on his bilateral lower legs in the setting of severe lymphedema 03/26/2022; patient is here for wounds on his bilateral lower legs medial aspect History of Present Illness (HPI) ADMISSION 02/23/2020 Patient is a 77 year old man who lives in [place] who arrives accompanied by his wife. He has a history of chronic lymphedema and venous insufficiency in his bilateral lower legs which may have something to do that with having a history of DVT as well as being treated for prostate cancer. In any case he recently got compression pumps at home but compliance has been an issue here. He has compression stockings however they are probably not sufficient enough to control swelling. They tell us that things deteriorated for him in late August he was admitted to [facility] for 7 days. This was with cellulitis I think of his bilateral lower legs. Discharge he was noted to have wounds on his bilateral lower legs. He was discharged on Bactrim. They tried to get him home health through [facility] of course they declined him. His wife is been wrapping these applying some form of silver foam dressing. He has a history of wounds before although nothing that would not heal with basic home topical dressings. He  Date of Service: [name] [name] [name] 03/04/2023 3:00 PM Medical Record Number: [id number] Patient Account Number: [id number] Date of Birth/Sex: Treating RN: [birth year] (77 y.o. [name] [name] Primary Care Provider: Ricki [name] Other Clinician: Referring Provider: Treating Provider/Extender: [name] [name] Weeks in Treatment: 57 Debridement Performed for Assessment: Wound #6 Right,Medial Lower Leg Performed By: Physician [name] Kelp, PA The following information was scribed by: Karie [name] The information was scribed for: [name] [name] Debridement Type: Debridement Level of Consciousness (Pre-procedure): Awake and Alert Pre-procedure Verification/Time Out Yes - 15:47 Taken: Start Time: 15:47 Pain Control: Lidocaine 4% T opical Solution Percent of Wound Bed Debrided: 100% T Area Debrided (cm): otal 28.26 Tissue and other material debrided: Viable, Non-Viable, Slough, Subcutaneous, Slough Level: Skin/Subcutaneous Tissue Debridement Description: Excisional Instrument: Curette Bleeding: Minimum Hemostasis Achieved:  Pressure End Time: 15:52 Procedural Pain: 0 Post Procedural Pain: 0 Response to Treatment: Procedure was tolerated well Level of Consciousness (Post- Awake and Alert procedure): Post Debridement Measurements of Total Wound Length: (cm) 8 Width: (cm) 4.5 Depth: (cm) 0.5 Volume: (cm) 14.137 Character of Wound/Ulcer Post Debridement: Improved Post Procedure Diagnosis Same as Pre-procedure Electronic Signature(s) Signed: 03/04/2023 4:26:00 PM By: [name] Derry PA-C Signed: 03/04/2023 5:01:16 PM By: [name] RN Entered By: Karie [name] on 03/04/2023 13:03:58 -------------------------------------------------------------------------------- Debridement Details Patient Name: Date of Service: [name] [name] [name] 03/04/2023 3:00 PM Medical Record Number: [id number] Patient Account Number: [id number] Date of Birth/Sex: Treating RN: [birth year] (77 y.o. [name] [name] Primary Care Provider: Ricki [name] Other Clinician: Referring Provider: Treating Provider/Extender: [name] [name] Weeks in Treatment: 61 Debridement Performed for Assessment: Wound #7 Left,Medial Lower Leg Performed By: Physician [name] Kelp, PA The following information was scribed by: Karie [name] The information was scribed for: [name] [name] Debridement Type: Debridement [name] [name] [name] ([id number]) [id number] Page 4 of 17 Level of Consciousness (Pre-procedure): Awake and Alert Pre-procedure Verification/Time Out Yes - 15:47 Taken: Start Time: 15:47 Pain Control: Lidocaine 4% T opical Solution Percent of Wound Bed Debrided: 100% T Area Debrided (cm): otal 9.89 Tissue and other material debrided: Viable, Non-Viable, Slough, Subcutaneous, Slough Level: Skin/Subcutaneous Tissue Debridement Description: Excisional Instrument: Curette Bleeding: Minimum Hemostasis Achieved: Pressure End Time: 15:52 Procedural Pain: 0 Post Procedural Pain: 0 Response  to Treatment: Procedure was tolerated well Level of Consciousness (Post- Awake and Alert procedure): Post Debridement Measurements of Total Wound Length: (cm) 3 Width: (cm) 4.2 Depth: (cm) 0.2 Volume: (cm) 1.979 Character of Wound/Ulcer Post Debridement: Improved Post Procedure Diagnosis Same as Pre-procedure Electronic Signature(s) Signed: 03/04/2023 4:26:00 PM By: [name] Derry PA-C Signed: 03/04/2023 5:01:16 PM By: [name] RN Entered By: Karie [name] on 03/04/2023 13:04:21 -------------------------------------------------------------------------------- HPI Details Patient Name: Date of Service: [name] [name] [name] 03/04/2023 3:00 PM Medical Record Number: [id number] Patient Account Number: [id number] Date of Birth/Sex: Treating RN: [birth year] (77 y.o. M) Primary Care Provider: Ricki [name] Other Clinician: Referring Provider: Treating Provider/Extender: [name] [name] Weeks in Treatment: 37 History of Present Illness HPI Description: ADMISSION 02/23/2020 Patient is a 77 year old man who lives in [place] who arrives accompanied by his wife. He has a history of chronic lymphedema and venous insufficiency in his bilateral lower legs which may have something to do that with having a history of DVT as well as being treated for prostate cancer. In any case he recently got compression pumps at home but compliance has been an issue here. He has compression stockings however they are probably  Date of Service: [name] [name] [name] 03/04/2023 3:00 PM Medical Record Number: [id number] Patient Account Number: [id number] Date of Birth/Sex: Treating RN: [birth year] (77 y.o. [name] [name] Primary Care Provider: Ricki [name] Other Clinician: Referring Provider: Treating Provider/Extender: [name] [name] Weeks in Treatment: 57 Debridement Performed for Assessment: Wound #6 Right,Medial Lower Leg Performed By: Physician [name] Kelp, PA The following information was scribed by: Karie [name] The information was scribed for: [name] [name] Debridement Type: Debridement Level of Consciousness (Pre-procedure): Awake and Alert Pre-procedure Verification/Time Out Yes - 15:47 Taken: Start Time: 15:47 Pain Control: Lidocaine 4% T opical Solution Percent of Wound Bed Debrided: 100% T Area Debrided (cm): otal 28.26 Tissue and other material debrided: Viable, Non-Viable, Slough, Subcutaneous, Slough Level: Skin/Subcutaneous Tissue Debridement Description: Excisional Instrument: Curette Bleeding: Minimum Hemostasis Achieved:  Pressure End Time: 15:52 Procedural Pain: 0 Post Procedural Pain: 0 Response to Treatment: Procedure was tolerated well Level of Consciousness (Post- Awake and Alert procedure): Post Debridement Measurements of Total Wound Length: (cm) 8 Width: (cm) 4.5 Depth: (cm) 0.5 Volume: (cm) 14.137 Character of Wound/Ulcer Post Debridement: Improved Post Procedure Diagnosis Same as Pre-procedure Electronic Signature(s) Signed: 03/04/2023 4:26:00 PM By: [name] Derry PA-C Signed: 03/04/2023 5:01:16 PM By: [name] RN Entered By: Karie [name] on 03/04/2023 13:03:58 -------------------------------------------------------------------------------- Debridement Details Patient Name: Date of Service: [name] [name] [name] 03/04/2023 3:00 PM Medical Record Number: [id number] Patient Account Number: [id number] Date of Birth/Sex: Treating RN: [birth year] (77 y.o. [name] [name] Primary Care Provider: Ricki [name] Other Clinician: Referring Provider: Treating Provider/Extender: [name] [name] Weeks in Treatment: 61 Debridement Performed for Assessment: Wound #7 Left,Medial Lower Leg Performed By: Physician [name] Kelp, PA The following information was scribed by: Karie [name] The information was scribed for: [name] [name] Debridement Type: Debridement [name] [name] [name] ([id number]) [id number] Page 4 of 17 Level of Consciousness (Pre-procedure): Awake and Alert Pre-procedure Verification/Time Out Yes - 15:47 Taken: Start Time: 15:47 Pain Control: Lidocaine 4% T opical Solution Percent of Wound Bed Debrided: 100% T Area Debrided (cm): otal 9.89 Tissue and other material debrided: Viable, Non-Viable, Slough, Subcutaneous, Slough Level: Skin/Subcutaneous Tissue Debridement Description: Excisional Instrument: Curette Bleeding: Minimum Hemostasis Achieved: Pressure End Time: 15:52 Procedural Pain: 0 Post Procedural Pain: 0 Response  to Treatment: Procedure was tolerated well Level of Consciousness (Post- Awake and Alert procedure): Post Debridement Measurements of Total Wound Length: (cm) 3 Width: (cm) 4.2 Depth: (cm) 0.2 Volume: (cm) 1.979 Character of Wound/Ulcer Post Debridement: Improved Post Procedure Diagnosis Same as Pre-procedure Electronic Signature(s) Signed: 03/04/2023 4:26:00 PM By: [name] Derry PA-C Signed: 03/04/2023 5:01:16 PM By: [name] RN Entered By: Karie [name] on 03/04/2023 13:04:21 -------------------------------------------------------------------------------- HPI Details Patient Name: Date of Service: [name] [name] [name] 03/04/2023 3:00 PM Medical Record Number: [id number] Patient Account Number: [id number] Date of Birth/Sex: Treating RN: [birth year] (77 y.o. M) Primary Care Provider: Ricki [name] Other Clinician: Referring Provider: Treating Provider/Extender: [name] [name] Weeks in Treatment: 37 History of Present Illness HPI Description: ADMISSION 02/23/2020 Patient is a 77 year old man who lives in [place] who arrives accompanied by his wife. He has a history of chronic lymphedema and venous insufficiency in his bilateral lower legs which may have something to do that with having a history of DVT as well as being treated for prostate cancer. In any case he recently got compression pumps at home but compliance has been an issue here. He has compression stockings however they are probably  Hasting, [name] [name] ([id number]) [id number] Page 1 of 17 Visit Report for 03/04/2023 Chief Complaint Document Details Patient Name: Date of Service: [name] [name] [name] 03/04/2023 3:00 PM Medical Record Number: [id number] Patient Account Number: [id number] Date of Birth/Sex: Treating RN: 19-Feb-1946 (77 y.o. M) Primary Care Provider: Ricki [name] Other Clinician: Referring Provider: Treating Provider/Extender: [name] [name] Weeks in Treatment: 70 Information Obtained from: Patient Chief Complaint 02/23/2020; patient is here for wounds on his bilateral lower legs in the setting of severe lymphedema 03/26/2022; patient is here for wounds on his bilateral lower legs medial aspect Electronic Signature(s) Signed: 03/04/2023 3:18:35 PM By: [name] Derry PA-C Entered By: [name] [name] on 03/04/2023 12:18:35 -------------------------------------------------------------------------------- Debridement Details Patient Name: Date of Service: [name] [name] [name] 03/04/2023 3:00 PM Medical Record Number: [id number] Patient Account Number: [id number] Date of Birth/Sex: Treating RN: 1945/06/15 (77 y.o. [name] [name] Primary Care Provider: Ricki [name] Other Clinician: Referring Provider: Treating Provider/Extender: [name] [name] Weeks in Treatment: 67 Debridement Performed for Assessment: Wound #12 Left,Anterior Lower Leg Performed By: Physician [name] Kelp, PA The following information was scribed by: Karie [name] The information was scribed for: [name] [name] Debridement Type: Debridement Level of Consciousness (Pre-procedure): Awake and Alert Pre-procedure Verification/Time Out Yes - 15:47 Taken: Start Time: 15:47 Pain Control: Lidocaine 4% T opical Solution Percent of Wound Bed Debrided: 100% T Area Debrided (cm): otal 0.39 Tissue and other material debrided: Viable, Non-Viable, Slough, Subcutaneous, Slough Level:  Skin/Subcutaneous Tissue Debridement Description: Excisional Instrument: Curette Bleeding: Minimum Hemostasis Achieved: Pressure End Time: 15:52 Procedural Pain: 0 Post Procedural Pain: 0 Response to Treatment: Procedure was tolerated well Level of Consciousness (Post- Awake and Alert procedure): Post Debridement Measurements of Total Wound Length: (cm) 1 Width: (cm) 0.5 Depth: (cm) 0.1 [name] [name] ([id number]) [id number] Page 2 of 17 Volume: (cm) 0.039 Character of Wound/Ulcer Post Debridement: Improved Post Procedure Diagnosis Same as Pre-procedure Electronic Signature(s) Signed: 03/04/2023 4:26:00 PM By: [name] Derry PA-C Signed: 03/04/2023 5:01:16 PM By: [name] RN Entered By: Karie [name] on 03/04/2023 13:03:21 -------------------------------------------------------------------------------- Debridement Details Patient Name: Date of Service: [name] [name] [name] 03/04/2023 3:00 PM Medical Record Number: [id number] Patient Account Number: [id number] Date of Birth/Sex: Treating RN: Aug 09, 1945 (77 y.o. [name] [name] Primary Care Provider: Ricki [name] Other Clinician: Referring Provider: Treating Provider/Extender: [name] [name] Weeks in Treatment: 14 Debridement Performed for Assessment: Wound #15 Left,Distal,Posterior Lower Leg Performed By: Physician [name] Kelp, PA The following information was scribed by: Karie [name] The information was scribed for: [name] [name] Debridement Type: Debridement Level of Consciousness (Pre-procedure): Awake and Alert Pre-procedure Verification/Time Out Yes - 15:47 Taken: Start Time: 15:47 Pain Control: Lidocaine 4% T opical Solution Percent of Wound Bed Debrided: 100% T Area Debrided (cm): otal 4.12 Tissue and other material debrided: Viable, Non-Viable, Slough, Subcutaneous, Slough Level: Skin/Subcutaneous Tissue Debridement Description:  Excisional Instrument: Curette Bleeding: Minimum Hemostasis Achieved: Pressure End Time: 15:52 Procedural Pain: 0 Post Procedural Pain: 0 Response to Treatment: Procedure was tolerated well Level of Consciousness (Post- Awake and Alert procedure): Post Debridement Measurements of Total Wound Length: (cm) 3.5 Width: (cm) 1.5 Depth: (cm) 0.1 Volume: (cm) 0.412 Character of Wound/Ulcer Post Debridement: Improved Post Procedure Diagnosis Same as Pre-procedure Electronic Signature(s) Signed: 03/04/2023 4:26:00 PM By: [name] Derry PA-C Signed: 03/04/2023 5:01:16 PM By: [name] RN Entered By: Karie [name] on 03/04/2023 13:03:37 Burchill, [name] [name] ([id number]) [id number] Page 3 of 17 -------------------------------------------------------------------------------- Debridement Details Patient Name:  today. He is less weeping and the legs are actually significantly smaller. He tells me has been trying to stay in bed with his legs elevated more. 02-25-2023 upon evaluation today patient appears to be doing well currently in regard to his legs in general compared to where we have been. Fortunately I do not see any signs of active infection locally or systemically which is great news and in general I do believe that we are making good headway here towards complete closure. I am hopeful that he will continue to show signs of improvement as we progress going forward. He does seem to have been taking his fluid pills as well as  elevating his legs and using lymphedema pumps on a regular basis. He is trying to stay out of the hospital. 03-04-2023 upon evaluation today patient appears to be doing well currently in regard to his wound. He has been tolerating the dressing changes without complication. Fortunately there does not appear to be any signs of active infection at this time. Electronic Signature(s) Signed: 03/04/2023 4:17:16 PM By: [name] Derry PA-C Entered By: [name] [name] on 03/04/2023 13:17:16 -------------------------------------------------------------------------------- Physical Exam Details Patient Name: Date of Service: [name] [name] [name] 03/04/2023 3:00 PM Medical Record Number: [id number] Patient Account Number: [id number] Date of Birth/Sex: Treating RN: 10-05-1945 (77 y.o. M) Primary Care Provider: Ricki [name] Other Clinician: Referring Provider: Treating Provider/Extender: [name] [name] in Treatment: 58 Level, [name] [name] ([id number]) [id number] Page 8 of 17 Constitutional Well-nourished and well-hydrated in no acute distress. Respiratory normal breathing without difficulty. Psychiatric this patient is able to make decisions and demonstrates good insight into disease process. Alert and Oriented x 3. pleasant and cooperative. Notes Upon inspection patient's wound bed actually showed signs of good granulation and epithelization at this point. Actually feel like he is making good headway towards closure and very pleased with where we stand I do believe that he is doing better compared to where we were previous. Electronic Signature(s) Signed: 03/04/2023 4:17:32 PM By: [name] Derry PA-C Entered By: [name] [name] on 03/04/2023 13:17:32 -------------------------------------------------------------------------------- Physician Orders Details Patient Name: Date of Service: [name] [name] [name] 03/04/2023 3:00 PM Medical Record Number: [id number] Patient  Account Number: [id number] Date of Birth/Sex: Treating RN: 08/01/1945 (77 y.o. [name] [name] Primary Care Provider: Ricki [name] Other Clinician: Referring Provider: Treating Provider/Extender: [name] [name] in Treatment: 16 Verbal / Phone Orders: No Diagnosis Coding ICD-10 Coding Code Description [phone] Chronic venous hypertension (idiopathic) with ulcer and inflammation of bilateral lower extremity I89.0 Lymphedema, not elsewhere classified 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.818 Non-pressure chronic ulcer of other part of right lower leg with other specified severity L84 Corns and callosities Follow-up Appointments ppointment in 1 week. [name] Derry PA Wednesday ****extra time 60 minutes**** 03/11/2023 3pm Return A ppointment in 2 weeks. [name] Derry PA (already scheduled) Return A Return appointment in 3 weeks. - 03/18/2023 3pm Jermiya Reichl (already scheduled) Return appointment in 1 month. - Please see front desk to schedule appointment Anesthetic (In clinic) Topical Lidocaine 5% applied to wound bed Bathing/ Shower/ Hygiene May shower with protection but do not get wound dressing(s) wet. Protect dressing(s) with water repellant cover (for example, large plastic bag) or a cast cover and may then take shower. Edema Control - Lymphedema / SCD / Other Lymphedema Pumps. Use Lymphedema pumps on leg(s) 2-3 times a day for 45-60 minutes. If wearing any wraps  Date of Service: [name] [name] [name] 03/04/2023 3:00 PM Medical Record Number: [id number] Patient Account Number: [id number] Date of Birth/Sex: Treating RN: [birth year] (77 y.o. [name] [name] Primary Care Provider: Ricki [name] Other Clinician: Referring Provider: Treating Provider/Extender: [name] [name] Weeks in Treatment: 57 Debridement Performed for Assessment: Wound #6 Right,Medial Lower Leg Performed By: Physician [name] Kelp, PA The following information was scribed by: Karie [name] The information was scribed for: [name] [name] Debridement Type: Debridement Level of Consciousness (Pre-procedure): Awake and Alert Pre-procedure Verification/Time Out Yes - 15:47 Taken: Start Time: 15:47 Pain Control: Lidocaine 4% T opical Solution Percent of Wound Bed Debrided: 100% T Area Debrided (cm): otal 28.26 Tissue and other material debrided: Viable, Non-Viable, Slough, Subcutaneous, Slough Level: Skin/Subcutaneous Tissue Debridement Description: Excisional Instrument: Curette Bleeding: Minimum Hemostasis Achieved:  Pressure End Time: 15:52 Procedural Pain: 0 Post Procedural Pain: 0 Response to Treatment: Procedure was tolerated well Level of Consciousness (Post- Awake and Alert procedure): Post Debridement Measurements of Total Wound Length: (cm) 8 Width: (cm) 4.5 Depth: (cm) 0.5 Volume: (cm) 14.137 Character of Wound/Ulcer Post Debridement: Improved Post Procedure Diagnosis Same as Pre-procedure Electronic Signature(s) Signed: 03/04/2023 4:26:00 PM By: [name] Derry PA-C Signed: 03/04/2023 5:01:16 PM By: [name] RN Entered By: Karie [name] on 03/04/2023 13:03:58 -------------------------------------------------------------------------------- Debridement Details Patient Name: Date of Service: [name] [name] [name] 03/04/2023 3:00 PM Medical Record Number: [id number] Patient Account Number: [id number] Date of Birth/Sex: Treating RN: [birth year] (77 y.o. [name] [name] Primary Care Provider: Ricki [name] Other Clinician: Referring Provider: Treating Provider/Extender: [name] [name] Weeks in Treatment: 61 Debridement Performed for Assessment: Wound #7 Left,Medial Lower Leg Performed By: Physician [name] Kelp, PA The following information was scribed by: Karie [name] The information was scribed for: [name] [name] Debridement Type: Debridement [name] [name] [name] ([id number]) [id number] Page 4 of 17 Level of Consciousness (Pre-procedure): Awake and Alert Pre-procedure Verification/Time Out Yes - 15:47 Taken: Start Time: 15:47 Pain Control: Lidocaine 4% T opical Solution Percent of Wound Bed Debrided: 100% T Area Debrided (cm): otal 9.89 Tissue and other material debrided: Viable, Non-Viable, Slough, Subcutaneous, Slough Level: Skin/Subcutaneous Tissue Debridement Description: Excisional Instrument: Curette Bleeding: Minimum Hemostasis Achieved: Pressure End Time: 15:52 Procedural Pain: 0 Post Procedural Pain: 0 Response  to Treatment: Procedure was tolerated well Level of Consciousness (Post- Awake and Alert procedure): Post Debridement Measurements of Total Wound Length: (cm) 3 Width: (cm) 4.2 Depth: (cm) 0.2 Volume: (cm) 1.979 Character of Wound/Ulcer Post Debridement: Improved Post Procedure Diagnosis Same as Pre-procedure Electronic Signature(s) Signed: 03/04/2023 4:26:00 PM By: [name] Derry PA-C Signed: 03/04/2023 5:01:16 PM By: [name] RN Entered By: Karie [name] on 03/04/2023 13:04:21 -------------------------------------------------------------------------------- HPI Details Patient Name: Date of Service: [name] [name] [name] 03/04/2023 3:00 PM Medical Record Number: [id number] Patient Account Number: [id number] Date of Birth/Sex: Treating RN: [birth year] (77 y.o. M) Primary Care Provider: Ricki [name] Other Clinician: Referring Provider: Treating Provider/Extender: [name] [name] Weeks in Treatment: 37 History of Present Illness HPI Description: ADMISSION 02/23/2020 Patient is a 77 year old man who lives in [place] who arrives accompanied by his wife. He has a history of chronic lymphedema and venous insufficiency in his bilateral lower legs which may have something to do that with having a history of DVT as well as being treated for prostate cancer. In any case he recently got compression pumps at home but compliance has been an issue here. He has compression stockings however they are probably  Date of Service: [name] [name] [name] 03/04/2023 3:00 PM Medical Record Number: [id number] Patient Account Number: [id number] Date of Birth/Sex: Treating RN: [birth year] (77 y.o. [name] [name] Primary Care Provider: Ricki [name] Other Clinician: Referring Provider: Treating Provider/Extender: [name] [name] Weeks in Treatment: 57 Debridement Performed for Assessment: Wound #6 Right,Medial Lower Leg Performed By: Physician [name] Kelp, PA The following information was scribed by: Karie [name] The information was scribed for: [name] [name] Debridement Type: Debridement Level of Consciousness (Pre-procedure): Awake and Alert Pre-procedure Verification/Time Out Yes - 15:47 Taken: Start Time: 15:47 Pain Control: Lidocaine 4% T opical Solution Percent of Wound Bed Debrided: 100% T Area Debrided (cm): otal 28.26 Tissue and other material debrided: Viable, Non-Viable, Slough, Subcutaneous, Slough Level: Skin/Subcutaneous Tissue Debridement Description: Excisional Instrument: Curette Bleeding: Minimum Hemostasis Achieved:  Pressure End Time: 15:52 Procedural Pain: 0 Post Procedural Pain: 0 Response to Treatment: Procedure was tolerated well Level of Consciousness (Post- Awake and Alert procedure): Post Debridement Measurements of Total Wound Length: (cm) 8 Width: (cm) 4.5 Depth: (cm) 0.5 Volume: (cm) 14.137 Character of Wound/Ulcer Post Debridement: Improved Post Procedure Diagnosis Same as Pre-procedure Electronic Signature(s) Signed: 03/04/2023 4:26:00 PM By: [name] Derry PA-C Signed: 03/04/2023 5:01:16 PM By: [name] RN Entered By: Karie [name] on 03/04/2023 13:03:58 -------------------------------------------------------------------------------- Debridement Details Patient Name: Date of Service: [name] [name] [name] 03/04/2023 3:00 PM Medical Record Number: [id number] Patient Account Number: [id number] Date of Birth/Sex: Treating RN: [birth year] (77 y.o. [name] [name] Primary Care Provider: Ricki [name] Other Clinician: Referring Provider: Treating Provider/Extender: [name] [name] Weeks in Treatment: 61 Debridement Performed for Assessment: Wound #7 Left,Medial Lower Leg Performed By: Physician [name] Kelp, PA The following information was scribed by: Karie [name] The information was scribed for: [name] [name] Debridement Type: Debridement [name] [name] [name] ([id number]) [id number] Page 4 of 17 Level of Consciousness (Pre-procedure): Awake and Alert Pre-procedure Verification/Time Out Yes - 15:47 Taken: Start Time: 15:47 Pain Control: Lidocaine 4% T opical Solution Percent of Wound Bed Debrided: 100% T Area Debrided (cm): otal 9.89 Tissue and other material debrided: Viable, Non-Viable, Slough, Subcutaneous, Slough Level: Skin/Subcutaneous Tissue Debridement Description: Excisional Instrument: Curette Bleeding: Minimum Hemostasis Achieved: Pressure End Time: 15:52 Procedural Pain: 0 Post Procedural Pain: 0 Response  to Treatment: Procedure was tolerated well Level of Consciousness (Post- Awake and Alert procedure): Post Debridement Measurements of Total Wound Length: (cm) 3 Width: (cm) 4.2 Depth: (cm) 0.2 Volume: (cm) 1.979 Character of Wound/Ulcer Post Debridement: Improved Post Procedure Diagnosis Same as Pre-procedure Electronic Signature(s) Signed: 03/04/2023 4:26:00 PM By: [name] Derry PA-C Signed: 03/04/2023 5:01:16 PM By: [name] RN Entered By: Karie [name] on 03/04/2023 13:04:21 -------------------------------------------------------------------------------- HPI Details Patient Name: Date of Service: [name] [name] [name] 03/04/2023 3:00 PM Medical Record Number: [id number] Patient Account Number: [id number] Date of Birth/Sex: Treating RN: [birth year] (77 y.o. M) Primary Care Provider: Ricki [name] Other Clinician: Referring Provider: Treating Provider/Extender: [name] [name] Weeks in Treatment: 37 History of Present Illness HPI Description: ADMISSION 02/23/2020 Patient is a 77 year old man who lives in [place] who arrives accompanied by his wife. He has a history of chronic lymphedema and venous insufficiency in his bilateral lower legs which may have something to do that with having a history of DVT as well as being treated for prostate cancer. In any case he recently got compression pumps at home but compliance has been an issue here. He has compression stockings however they are probably  Hasting, [name] [name] ([id number]) [id number] Page 1 of 17 Visit Report for 03/04/2023 Chief Complaint Document Details Patient Name: Date of Service: [name] [name] [name] 03/04/2023 3:00 PM Medical Record Number: [id number] Patient Account Number: [id number] Date of Birth/Sex: Treating RN: 19-Feb-1946 (77 y.o. M) Primary Care Provider: Ricki [name] Other Clinician: Referring Provider: Treating Provider/Extender: [name] [name] Weeks in Treatment: 70 Information Obtained from: Patient Chief Complaint 02/23/2020; patient is here for wounds on his bilateral lower legs in the setting of severe lymphedema 03/26/2022; patient is here for wounds on his bilateral lower legs medial aspect Electronic Signature(s) Signed: 03/04/2023 3:18:35 PM By: [name] Derry PA-C Entered By: [name] [name] on 03/04/2023 12:18:35 -------------------------------------------------------------------------------- Debridement Details Patient Name: Date of Service: [name] [name] [name] 03/04/2023 3:00 PM Medical Record Number: [id number] Patient Account Number: [id number] Date of Birth/Sex: Treating RN: 1945/06/15 (77 y.o. [name] [name] Primary Care Provider: Ricki [name] Other Clinician: Referring Provider: Treating Provider/Extender: [name] [name] Weeks in Treatment: 67 Debridement Performed for Assessment: Wound #12 Left,Anterior Lower Leg Performed By: Physician [name] Kelp, PA The following information was scribed by: Karie [name] The information was scribed for: [name] [name] Debridement Type: Debridement Level of Consciousness (Pre-procedure): Awake and Alert Pre-procedure Verification/Time Out Yes - 15:47 Taken: Start Time: 15:47 Pain Control: Lidocaine 4% T opical Solution Percent of Wound Bed Debrided: 100% T Area Debrided (cm): otal 0.39 Tissue and other material debrided: Viable, Non-Viable, Slough, Subcutaneous, Slough Level:  Skin/Subcutaneous Tissue Debridement Description: Excisional Instrument: Curette Bleeding: Minimum Hemostasis Achieved: Pressure End Time: 15:52 Procedural Pain: 0 Post Procedural Pain: 0 Response to Treatment: Procedure was tolerated well Level of Consciousness (Post- Awake and Alert procedure): Post Debridement Measurements of Total Wound Length: (cm) 1 Width: (cm) 0.5 Depth: (cm) 0.1 [name] [name] ([id number]) [id number] Page 2 of 17 Volume: (cm) 0.039 Character of Wound/Ulcer Post Debridement: Improved Post Procedure Diagnosis Same as Pre-procedure Electronic Signature(s) Signed: 03/04/2023 4:26:00 PM By: [name] Derry PA-C Signed: 03/04/2023 5:01:16 PM By: [name] RN Entered By: Karie [name] on 03/04/2023 13:03:21 -------------------------------------------------------------------------------- Debridement Details Patient Name: Date of Service: [name] [name] [name] 03/04/2023 3:00 PM Medical Record Number: [id number] Patient Account Number: [id number] Date of Birth/Sex: Treating RN: Aug 09, 1945 (77 y.o. [name] [name] Primary Care Provider: Ricki [name] Other Clinician: Referring Provider: Treating Provider/Extender: [name] [name] Weeks in Treatment: 14 Debridement Performed for Assessment: Wound #15 Left,Distal,Posterior Lower Leg Performed By: Physician [name] Kelp, PA The following information was scribed by: Karie [name] The information was scribed for: [name] [name] Debridement Type: Debridement Level of Consciousness (Pre-procedure): Awake and Alert Pre-procedure Verification/Time Out Yes - 15:47 Taken: Start Time: 15:47 Pain Control: Lidocaine 4% T opical Solution Percent of Wound Bed Debrided: 100% T Area Debrided (cm): otal 4.12 Tissue and other material debrided: Viable, Non-Viable, Slough, Subcutaneous, Slough Level: Skin/Subcutaneous Tissue Debridement Description:  Excisional Instrument: Curette Bleeding: Minimum Hemostasis Achieved: Pressure End Time: 15:52 Procedural Pain: 0 Post Procedural Pain: 0 Response to Treatment: Procedure was tolerated well Level of Consciousness (Post- Awake and Alert procedure): Post Debridement Measurements of Total Wound Length: (cm) 3.5 Width: (cm) 1.5 Depth: (cm) 0.1 Volume: (cm) 0.412 Character of Wound/Ulcer Post Debridement: Improved Post Procedure Diagnosis Same as Pre-procedure Electronic Signature(s) Signed: 03/04/2023 4:26:00 PM By: [name] Derry PA-C Signed: 03/04/2023 5:01:16 PM By: [name] RN Entered By: Karie [name] on 03/04/2023 13:03:37 Burchill, [name] [name] ([id number]) [id number] Page 3 of 17 -------------------------------------------------------------------------------- Debridement Details Patient Name:

## 2023-03-11 ENCOUNTER — Encounter (HOSPITAL_BASED_OUTPATIENT_CLINIC_OR_DEPARTMENT_OTHER): Payer: Medicare Other | Admitting: Physician Assistant

## 2023-03-11 DIAGNOSIS — I87333 Chronic venous hypertension (idiopathic) with ulcer and inflammation of bilateral lower extremity: Secondary | ICD-10-CM | POA: Diagnosis not present

## 2023-03-11 NOTE — Progress Notes (Signed)
Leg Wound Laterality: Left, Medial Cleanser: Soap and Water 1 x Per Week/30 Days Discharge Instructions: May shower and wash wound with dial antibacterial soap and water prior to dressing change. Peri-Wound Care: Sween Lotion (Moisturizing lotion) 1 x Per Week/30 Days Discharge Instructions: Apply moisturizing lotion as directed Prim Dressing: Aquacel Ag 1 x Per Week/30 Days ary Discharge Instructions: apply directly to wound bed. Secondary Dressing: ABD Pad, 8x10 1 x Per  Week/30 Days Discharge Instructions: Apply over primary dressing as directed. Secondary Dressing: OptiLock Super Absorbent, 5x5.5 (in/in) (Generic) 1 x Per Week/30 Days Discharge Instructions: Apply directly to wound bed as directed Compression Wrap: Urgo K2, (equivalent to a 4 layer) two layer compression system, regular 1 x Per Week/30 Days Discharge Instructions: Apply Urgo K2 as directed (alternative to 4 layer compression). Electronic Signature(s) Signed: 03/11/2023 4:45:44 PM By: [name] PA-C Signed: 03/11/2023 5:00:37 PM By: [name] RN Entered By: [name] on 03/11/2023 12:52:21 Tippets, [name] [id number] Page 10 of 17 -------------------------------------------------------------------------------- Problem List Details Patient Name: Date of Service: Gonterman, [name] 03/11/2023 3:00 PM Medical Record Number: [id number] Patient Account Number: [id number] Date of Birth/Sex: Treating RN: 08-30-1945 (77 y.o. 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/11/2022 No Yes bilateral lower extremity I89.0 Lymphedema, not elsewhere classified 03/26/2022 No Yes L97.828 Non-pressure chronic ulcer of other part of left lower leg with other specified 03/26/2022 No Yes severity L97.818 Non-pressure chronic ulcer of other part of right lower leg with other specified 03/26/2022 No Yes severity L84 Corns and callosities 10/15/2022 No Yes Inactive Problems Resolved Problems Electronic Signature(s) Signed: 03/11/2023 2:41:03 PM By: [name] PA-C Entered By: [name] on 03/11/2023  11:41:03 -------------------------------------------------------------------------------- Progress Note Details Patient Name: Date of Service: Azer, [name] 03/11/2023 3:00 PM Medical Record Number: [id number] Patient Account Number: [id number] Date of Birth/Sex: Treating RN: [birth year] (77 y.o. M) Primary Care Provider: [name] Other Clinician: Referring Provider: Treating Provider/Extender: [name] Weeks in Treatment: 3 Subjective Chief Complaint Information obtained from Patient 02/23/2020; patient is here for wounds on his bilateral lower legs in the setting of severe lymphedema 03/26/2022; patient is here for wounds on his bilateral lower legs medial aspect Eckert, Breion ([id number]) [id number] Page 11 of 17 History of Present Illness (HPI) ADMISSION 02/23/2020 Patient is a 77 year old man who lives in [place] who arrives accompanied by his wife. He has a history of chronic lymphedema and venous insufficiency in his bilateral lower legs which may have something to do that with having a history of DVT as well as being treated for prostate cancer. In any case he recently got compression pumps at home but compliance has been an issue here. He has compression stockings however they are probably not sufficient enough to control swelling. They tell us that things deteriorated for him in late August he was admitted to [facility] for 7 days. This was with cellulitis I think of his bilateral lower legs. Discharge he was noted to have wounds on his bilateral lower legs. He was discharged on Bactrim. They tried to get him home health through [facility] of course they declined him. His wife is been wrapping these applying some form of silver foam dressing. He has a history of wounds before although  Posey, [name] ([id number]) [id number] Page 1 of 17 Visit Report for 03/11/2023 Chief Complaint Document Details Patient Name: Date of Service: Schulte, [name] 03/11/2023 3:00 PM Medical Record Number: [id number] Patient Account Number: [id number] Date of Birth/Sex: Treating RN: 1945/05/29 (77 y.o. M) Primary Care Provider: [name] Other Clinician: Referring Provider: Treating Provider/Extender: [name] Weeks in Treatment: 23 Information Obtained from: Patient Chief Complaint 02/23/2020; patient is here for wounds on his bilateral lower legs in the setting of severe lymphedema 03/26/2022; patient is here for wounds on his bilateral lower legs medial aspect Electronic Signature(s) Signed: 03/11/2023 2:41:19 PM By: [name] PA-C Entered By: [name] on 03/11/2023 11:41:19 -------------------------------------------------------------------------------- Debridement Details Patient Name: Date of Service: Mance, [name] 03/11/2023 3:00 PM Medical Record Number: [id number] Patient Account Number: [id number] Date of Birth/Sex: Treating RN: March 11, 1946 (77 y.o. [name]: [name] Other Clinician: Referring Provider: Treating Provider/Extender: [name] Weeks in Treatment: 50 Debridement Performed for Assessment: Wound #12 Left,Anterior Lower Leg Performed By: Physician [name], PA The following information was scribed by: [name] The information was scribed for: [name] Debridement Type: Debridement Level of Consciousness (Pre-procedure): Awake and Alert Pre-procedure Verification/Time Out Yes - 15:40 Taken: Start Time: 15:40 Pain Control: Lidocaine 4% T opical Solution Percent of Wound Bed Debrided: 100% T Area Debrided (cm): otal 0.39 Tissue and other material debrided: Viable, Non-Viable, Slough, Subcutaneous, Skin: Dermis ,  Skin: Epidermis, Slough Level: Skin/Subcutaneous Tissue Debridement Description: Excisional Instrument: Curette Bleeding: Minimum Hemostasis Achieved: Pressure Response to Treatment: Procedure was tolerated well Level of Consciousness (Post- Awake and Alert procedure): Post Debridement Measurements of Total Wound Length: (cm) 1 Width: (cm) 0.5 Depth: (cm) 0.1 Volume: (cm) 0.039 Character of Wound/Ulcer Post Debridement: Improved Fuller, Deunte ([id number]) [id number] Page 2 of 17 Post Procedure Diagnosis Same as Pre-procedure Electronic Signature(s) Signed: 03/11/2023 4:45:44 PM By: [name] PA-C Signed: 03/11/2023 5:00:37 PM By: [name] RN Entered By: [name] on 03/11/2023 12:59:24 -------------------------------------------------------------------------------- Debridement Details Patient Name: Date of Service: Raney, [name] 03/11/2023 3:00 PM Medical Record Number: [id number] Patient Account Number: [id number] Date of Birth/Sex: Treating RN: [birth year] (77 y.o. [name]: [name] Other Clinician: Referring Provider: Treating Provider/Extender: [name] Weeks in Treatment: 50 Debridement Performed for Assessment: Wound #6 Right,Medial Lower Leg Performed By: Physician [name], PA The following information was scribed by: [name] The information was scribed for: [name] Debridement Type: Debridement Level of Consciousness (Pre-procedure): Awake and Alert Pre-procedure Verification/Time Out Yes - 15:40 Taken: Start Time: 15:40 Pain Control: Lidocaine 4% T opical Solution Percent of Wound Bed Debrided: 100% T Area Debrided (cm): otal 28.26 Tissue and other material debrided: Viable, Non-Viable, Slough, Subcutaneous, Skin: Dermis , Skin: Epidermis, Slough Level: Skin/Subcutaneous Tissue Debridement Description: Excisional Instrument:  Curette Bleeding: Minimum Hemostasis Achieved: Pressure Response to Treatment: Procedure was tolerated well Level of Consciousness (Post- Awake and Alert procedure): Post Debridement Measurements of Total Wound Length: (cm) 8 Width: (cm) 4.5 Depth: (cm) 0.4 Volume: (cm) 11.31 Character of Wound/Ulcer Post Debridement: Improved Post Procedure Diagnosis Same as Pre-procedure Electronic Signature(s) Signed: 03/11/2023 4:45:44 PM By: [name] PA-C Signed: 03/11/2023 5:00:37 PM By: [name] RN Entered By: [name] on 03/11/2023 12:59:36 -------------------------------------------------------------------------------- Debridement Details Patient Name: Date of Service: Nealis, [name] 03/11/2023 3:00 PM Medical Record Number: [id number] Patient Account Number: [id number] Stockard,  Posey, [name] ([id number]) [id number] Page 1 of 17 Visit Report for 03/11/2023 Chief Complaint Document Details Patient Name: Date of Service: Schulte, [name] 03/11/2023 3:00 PM Medical Record Number: [id number] Patient Account Number: [id number] Date of Birth/Sex: Treating RN: 1945/05/29 (77 y.o. M) Primary Care Provider: [name] Other Clinician: Referring Provider: Treating Provider/Extender: [name] Weeks in Treatment: 23 Information Obtained from: Patient Chief Complaint 02/23/2020; patient is here for wounds on his bilateral lower legs in the setting of severe lymphedema 03/26/2022; patient is here for wounds on his bilateral lower legs medial aspect Electronic Signature(s) Signed: 03/11/2023 2:41:19 PM By: [name] PA-C Entered By: [name] on 03/11/2023 11:41:19 -------------------------------------------------------------------------------- Debridement Details Patient Name: Date of Service: Mance, [name] 03/11/2023 3:00 PM Medical Record Number: [id number] Patient Account Number: [id number] Date of Birth/Sex: Treating RN: March 11, 1946 (77 y.o. [name]: [name] Other Clinician: Referring Provider: Treating Provider/Extender: [name] Weeks in Treatment: 50 Debridement Performed for Assessment: Wound #12 Left,Anterior Lower Leg Performed By: Physician [name], PA The following information was scribed by: [name] The information was scribed for: [name] Debridement Type: Debridement Level of Consciousness (Pre-procedure): Awake and Alert Pre-procedure Verification/Time Out Yes - 15:40 Taken: Start Time: 15:40 Pain Control: Lidocaine 4% T opical Solution Percent of Wound Bed Debrided: 100% T Area Debrided (cm): otal 0.39 Tissue and other material debrided: Viable, Non-Viable, Slough, Subcutaneous, Skin: Dermis ,  Skin: Epidermis, Slough Level: Skin/Subcutaneous Tissue Debridement Description: Excisional Instrument: Curette Bleeding: Minimum Hemostasis Achieved: Pressure Response to Treatment: Procedure was tolerated well Level of Consciousness (Post- Awake and Alert procedure): Post Debridement Measurements of Total Wound Length: (cm) 1 Width: (cm) 0.5 Depth: (cm) 0.1 Volume: (cm) 0.039 Character of Wound/Ulcer Post Debridement: Improved Fuller, Deunte ([id number]) [id number] Page 2 of 17 Post Procedure Diagnosis Same as Pre-procedure Electronic Signature(s) Signed: 03/11/2023 4:45:44 PM By: [name] PA-C Signed: 03/11/2023 5:00:37 PM By: [name] RN Entered By: [name] on 03/11/2023 12:59:24 -------------------------------------------------------------------------------- Debridement Details Patient Name: Date of Service: Raney, [name] 03/11/2023 3:00 PM Medical Record Number: [id number] Patient Account Number: [id number] Date of Birth/Sex: Treating RN: [birth year] (77 y.o. [name]: [name] Other Clinician: Referring Provider: Treating Provider/Extender: [name] Weeks in Treatment: 50 Debridement Performed for Assessment: Wound #6 Right,Medial Lower Leg Performed By: Physician [name], PA The following information was scribed by: [name] The information was scribed for: [name] Debridement Type: Debridement Level of Consciousness (Pre-procedure): Awake and Alert Pre-procedure Verification/Time Out Yes - 15:40 Taken: Start Time: 15:40 Pain Control: Lidocaine 4% T opical Solution Percent of Wound Bed Debrided: 100% T Area Debrided (cm): otal 28.26 Tissue and other material debrided: Viable, Non-Viable, Slough, Subcutaneous, Skin: Dermis , Skin: Epidermis, Slough Level: Skin/Subcutaneous Tissue Debridement Description: Excisional Instrument:  Curette Bleeding: Minimum Hemostasis Achieved: Pressure Response to Treatment: Procedure was tolerated well Level of Consciousness (Post- Awake and Alert procedure): Post Debridement Measurements of Total Wound Length: (cm) 8 Width: (cm) 4.5 Depth: (cm) 0.4 Volume: (cm) 11.31 Character of Wound/Ulcer Post Debridement: Improved Post Procedure Diagnosis Same as Pre-procedure Electronic Signature(s) Signed: 03/11/2023 4:45:44 PM By: [name] PA-C Signed: 03/11/2023 5:00:37 PM By: [name] RN Entered By: [name] on 03/11/2023 12:59:36 -------------------------------------------------------------------------------- Debridement Details Patient Name: Date of Service: Nealis, [name] 03/11/2023 3:00 PM Medical Record Number: [id number] Patient Account Number: [id number] Stockard,  would recommend that based on what we are seeing we go ahead and have the patient continue to monitor for any evidence of infection or worsening. I do believe that he is making excellent headway towards closure although it has been a long time coming I think he is actually doing what he needs to be doing at this point and this is helping keep the swelling under control much more effectively. 2. I am good recommend as well that we continue with the Aquacel Ag followed by the ABD pads to the open areas. We are using OPTi lock and the Urgo K2 compression wrap. 3. I am also can recommend he should continue the lymphedema pumps and elevate his legs this is still of utmost importance. We will see patient back for reevaluation in 1 week here in the clinic. If anything worsens or changes patient will contact our office for additional recommendations. Electronic Signature(s) Signed: 03/11/2023 4:35:02 PM By: [name] PA-C Entered By: [name] on 03/11/2023 13:35:02 -------------------------------------------------------------------------------- SuperBill Details Patient Name: Date of Service: Peregrina, [name] 03/11/2023 Medical Record Number: [id number] Patient Account Number: [id number] Date of Birth/Sex: Treating RN: 07/03/1945 (77 y.o. 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 with other  specified severity L84 Corns and callosities Facility Procedures : CPT4 Code: 78295621 Description: 11042 - DEB SUBQ TISSUE 20 SQ CM/< ICD-10 Diagnosis Description L97.828 Non-pressure chronic ulcer of other part of left lower leg with other specified L97.818 Non-pressure chronic ulcer of other part of right lower leg with other specified Modifier: severity severity Quantity: 1 : CPT4 Code: 30865784 Description: 11045 - DEB SUBQ TISS EA ADDL 20CM ICD-10 Diagnosis Description L97.828 Non-pressure chronic ulcer of other part of left lower leg with other specified L97.818 Non-pressure chronic ulcer of other part of right lower leg with other specified Modifier: severity severity Quantity: 2 Physician Procedures : CPT4 Code Description Modifier [id number] 11042 - WC PHYS SUBQ TISS 20 SQ CM ICD-10 Diagnosis Description 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 Quantity: 1 : [id number] 11045 - WC PHYS SUBQ TISS EA ADDL 20 CM ICD-10 Diagnosis Description 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 M) Primary Care Provider: [name] Other Clinician: Referring Provider: Treating Provider/Extender: [name] Weeks in Treatment: 23 Information Obtained from: Patient Chief Complaint 02/23/2020; patient is here for wounds on his bilateral lower legs in the setting of severe lymphedema 03/26/2022; patient is here for wounds on his bilateral lower legs medial aspect Electronic Signature(s) Signed: 03/11/2023 2:41:19 PM By: [name] PA-C Entered By: [name] on 03/11/2023 11:41:19 -------------------------------------------------------------------------------- Debridement Details Patient Name: Date of Service: Mance, [name] 03/11/2023 3:00 PM Medical Record Number: [id number] Patient Account Number: [id number] Date of Birth/Sex: Treating RN: March 11, 1946 (77 y.o. [name]: [name] Other Clinician: Referring Provider: Treating Provider/Extender: [name] Weeks in Treatment: 50 Debridement Performed for Assessment: Wound #12 Left,Anterior Lower Leg Performed By: Physician [name], PA The following information was scribed by: [name] The information was scribed for: [name] Debridement Type: Debridement Level of Consciousness (Pre-procedure): Awake and Alert Pre-procedure Verification/Time Out Yes - 15:40 Taken: Start Time: 15:40 Pain Control: Lidocaine 4% T opical Solution Percent of Wound Bed Debrided: 100% T Area Debrided (cm): otal 0.39 Tissue and other material debrided: Viable, Non-Viable, Slough, Subcutaneous, Skin: Dermis ,  Skin: Epidermis, Slough Level: Skin/Subcutaneous Tissue Debridement Description: Excisional Instrument: Curette Bleeding: Minimum Hemostasis Achieved: Pressure Response to Treatment: Procedure was tolerated well Level of Consciousness (Post- Awake and Alert procedure): Post Debridement Measurements of Total Wound Length: (cm) 1 Width: (cm) 0.5 Depth: (cm) 0.1 Volume: (cm) 0.039 Character of Wound/Ulcer Post Debridement: Improved Fuller, Deunte ([id number]) [id number] Page 2 of 17 Post Procedure Diagnosis Same as Pre-procedure Electronic Signature(s) Signed: 03/11/2023 4:45:44 PM By: [name] PA-C Signed: 03/11/2023 5:00:37 PM By: [name] RN Entered By: [name] on 03/11/2023 12:59:24 -------------------------------------------------------------------------------- Debridement Details Patient Name: Date of Service: Raney, [name] 03/11/2023 3:00 PM Medical Record Number: [id number] Patient Account Number: [id number] Date of Birth/Sex: Treating RN: [birth year] (77 y.o. [name]: [name] Other Clinician: Referring Provider: Treating Provider/Extender: [name] Weeks in Treatment: 50 Debridement Performed for Assessment: Wound #6 Right,Medial Lower Leg Performed By: Physician [name], PA The following information was scribed by: [name] The information was scribed for: [name] Debridement Type: Debridement Level of Consciousness (Pre-procedure): Awake and Alert Pre-procedure Verification/Time Out Yes - 15:40 Taken: Start Time: 15:40 Pain Control: Lidocaine 4% T opical Solution Percent of Wound Bed Debrided: 100% T Area Debrided (cm): otal 28.26 Tissue and other material debrided: Viable, Non-Viable, Slough, Subcutaneous, Skin: Dermis , Skin: Epidermis, Slough Level: Skin/Subcutaneous Tissue Debridement Description: Excisional Instrument:  Curette Bleeding: Minimum Hemostasis Achieved: Pressure Response to Treatment: Procedure was tolerated well Level of Consciousness (Post- Awake and Alert procedure): Post Debridement Measurements of Total Wound Length: (cm) 8 Width: (cm) 4.5 Depth: (cm) 0.4 Volume: (cm) 11.31 Character of Wound/Ulcer Post Debridement: Improved Post Procedure Diagnosis Same as Pre-procedure Electronic Signature(s) Signed: 03/11/2023 4:45:44 PM By: [name] PA-C Signed: 03/11/2023 5:00:37 PM By: [name] RN Entered By: [name] on 03/11/2023 12:59:36 -------------------------------------------------------------------------------- Debridement Details Patient Name: Date of Service: Nealis, [name] 03/11/2023 3:00 PM Medical Record Number: [id number] Patient Account Number: [id number] Stockard,  Posey, [name] ([id number]) [id number] Page 1 of 17 Visit Report for 03/11/2023 Chief Complaint Document Details Patient Name: Date of Service: Schulte, [name] 03/11/2023 3:00 PM Medical Record Number: [id number] Patient Account Number: [id number] Date of Birth/Sex: Treating RN: 1945/05/29 (77 y.o. M) Primary Care Provider: [name] Other Clinician: Referring Provider: Treating Provider/Extender: [name] Weeks in Treatment: 23 Information Obtained from: Patient Chief Complaint 02/23/2020; patient is here for wounds on his bilateral lower legs in the setting of severe lymphedema 03/26/2022; patient is here for wounds on his bilateral lower legs medial aspect Electronic Signature(s) Signed: 03/11/2023 2:41:19 PM By: [name] PA-C Entered By: [name] on 03/11/2023 11:41:19 -------------------------------------------------------------------------------- Debridement Details Patient Name: Date of Service: Mance, [name] 03/11/2023 3:00 PM Medical Record Number: [id number] Patient Account Number: [id number] Date of Birth/Sex: Treating RN: March 11, 1946 (77 y.o. [name]: [name] Other Clinician: Referring Provider: Treating Provider/Extender: [name] Weeks in Treatment: 50 Debridement Performed for Assessment: Wound #12 Left,Anterior Lower Leg Performed By: Physician [name], PA The following information was scribed by: [name] The information was scribed for: [name] Debridement Type: Debridement Level of Consciousness (Pre-procedure): Awake and Alert Pre-procedure Verification/Time Out Yes - 15:40 Taken: Start Time: 15:40 Pain Control: Lidocaine 4% T opical Solution Percent of Wound Bed Debrided: 100% T Area Debrided (cm): otal 0.39 Tissue and other material debrided: Viable, Non-Viable, Slough, Subcutaneous, Skin: Dermis ,  Skin: Epidermis, Slough Level: Skin/Subcutaneous Tissue Debridement Description: Excisional Instrument: Curette Bleeding: Minimum Hemostasis Achieved: Pressure Response to Treatment: Procedure was tolerated well Level of Consciousness (Post- Awake and Alert procedure): Post Debridement Measurements of Total Wound Length: (cm) 1 Width: (cm) 0.5 Depth: (cm) 0.1 Volume: (cm) 0.039 Character of Wound/Ulcer Post Debridement: Improved Fuller, Deunte ([id number]) [id number] Page 2 of 17 Post Procedure Diagnosis Same as Pre-procedure Electronic Signature(s) Signed: 03/11/2023 4:45:44 PM By: [name] PA-C Signed: 03/11/2023 5:00:37 PM By: [name] RN Entered By: [name] on 03/11/2023 12:59:24 -------------------------------------------------------------------------------- Debridement Details Patient Name: Date of Service: Raney, [name] 03/11/2023 3:00 PM Medical Record Number: [id number] Patient Account Number: [id number] Date of Birth/Sex: Treating RN: [birth year] (77 y.o. [name]: [name] Other Clinician: Referring Provider: Treating Provider/Extender: [name] Weeks in Treatment: 50 Debridement Performed for Assessment: Wound #6 Right,Medial Lower Leg Performed By: Physician [name], PA The following information was scribed by: [name] The information was scribed for: [name] Debridement Type: Debridement Level of Consciousness (Pre-procedure): Awake and Alert Pre-procedure Verification/Time Out Yes - 15:40 Taken: Start Time: 15:40 Pain Control: Lidocaine 4% T opical Solution Percent of Wound Bed Debrided: 100% T Area Debrided (cm): otal 28.26 Tissue and other material debrided: Viable, Non-Viable, Slough, Subcutaneous, Skin: Dermis , Skin: Epidermis, Slough Level: Skin/Subcutaneous Tissue Debridement Description: Excisional Instrument:  Curette Bleeding: Minimum Hemostasis Achieved: Pressure Response to Treatment: Procedure was tolerated well Level of Consciousness (Post- Awake and Alert procedure): Post Debridement Measurements of Total Wound Length: (cm) 8 Width: (cm) 4.5 Depth: (cm) 0.4 Volume: (cm) 11.31 Character of Wound/Ulcer Post Debridement: Improved Post Procedure Diagnosis Same as Pre-procedure Electronic Signature(s) Signed: 03/11/2023 4:45:44 PM By: [name] PA-C Signed: 03/11/2023 5:00:37 PM By: [name] RN Entered By: [name] on 03/11/2023 12:59:36 -------------------------------------------------------------------------------- Debridement Details Patient Name: Date of Service: Nealis, [name] 03/11/2023 3:00 PM Medical Record Number: [id number] Patient Account Number: [id number] [place],  would recommend that based on what we are seeing we go ahead and have the patient continue to monitor for any evidence of infection or worsening. I do believe that he is making excellent headway towards closure although it has been a long time coming I think he is actually doing what he needs to be doing at this point and this is helping keep the swelling under control much more effectively. 2. I am good recommend as well that we continue with the Aquacel Ag followed by the ABD pads to the open areas. We are using OPTi lock and the Urgo K2 compression wrap. 3. I am also can recommend he should continue the lymphedema pumps and elevate his legs this is still of utmost importance. We will see patient back for reevaluation in 1 week here in the clinic. If anything worsens or changes patient will contact our office for additional recommendations. Electronic Signature(s) Signed: 03/11/2023 4:35:02 PM By: [name] PA-C Entered By: [name] on 03/11/2023 13:35:02 -------------------------------------------------------------------------------- SuperBill Details Patient Name: Date of Service: Peregrina, [name] 03/11/2023 Medical Record Number: [id number] Patient Account Number: [id number] Date of Birth/Sex: Treating RN: 07/03/1945 (77 y.o. 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 with other  specified severity L84 Corns and callosities Facility Procedures : CPT4 Code: 78295621 Description: 11042 - DEB SUBQ TISSUE 20 SQ CM/< ICD-10 Diagnosis Description L97.828 Non-pressure chronic ulcer of other part of left lower leg with other specified L97.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 other part of right lower leg with other specified Modifier: severity severity Quantity: 1 : CPT4 Code: 30865784 Description: 11045 - DEB SUBQ TISS EA ADDL 20CM ICD-10 Diagnosis Description L97.828 Non-pressure chronic ulcer of other part of left lower leg with other specified L97.818 Non-pressure chronic ulcer of other part of right lower leg with other specified Modifier: severity severity Quantity: 2 Physician Procedures : CPT4 Code Description Modifier [id number] 11042 - WC PHYS SUBQ TISS 20 SQ CM ICD-10 Diagnosis Description 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 Quantity: 1 : [id number] 11045 - WC PHYS SUBQ TISS EA ADDL 20 CM ICD-10 Diagnosis Description 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.818 Non-pressure chronic ulcer of other part of right lower leg with other  specified severity Quantity: 2 Electronic Signature(s) Signed: 03/11/2023 4:35:26 PM By: [name] PA-C Entered By: [name] on 03/11/2023 13:35:25  bed with his legs elevated more. 02-25-2023 upon evaluation today patient appears to be doing well currently in regard to his legs in general compared to where we have been. Fortunately I do not see any signs of active infection locally or systemically which is great news and in general I do believe that we are making good headway here towards complete closure. I am hopeful that he will continue to show signs of improvement as we progress going forward. He does seem to have been taking his fluid pills as well as elevating his legs and using lymphedema pumps on a regular basis. He is trying to stay out of the hospital. 03-04-2023 upon evaluation today patient appears to be  doing well currently in regard to his wound. He has been tolerating the dressing changes without complication. Fortunately there does not appear to be any signs of active infection at this time. 03-11-2023 upon evaluation today patient appears to be doing well currently in regard to his wounds. He has been tolerating the dressing changes without complication. Fortunately I do not see any signs of active infection at this time which is great news. Electronic Signature(s) Signed: 03/11/2023 4:34:03 PM By: [name] PA-C Entered By: [name] on 03/11/2023 13:34:03 -------------------------------------------------------------------------------- Physical Exam Details Patient Name: Date of Service: Serio, [name] 03/11/2023 3:00 PM Medical Record Number: [id number] Patient Account Number: [id number] Date of Birth/Sex: Treating RN: Sep 07, 1945 (77 y.o. M) Primary Care Provider: [name] Other Clinician: Referring Provider: Treating Provider/Extender: [name] Weeks in Treatment: 50 Constitutional Obese and well-hydrated in no acute distress. Respiratory normal breathing without difficulty. Psychiatric this patient is able to make decisions and demonstrates good insight into disease process. Alert and Oriented x 3. pleasant and cooperative. Notes Patient's wounds actually are showing evidence of improvement. This has been a very slow process but he does seem to be doing quite well based on what I am seeing today. Gugel, [name] ([id number]) [id number] Page 8 of 17 Electronic Signature(s) Signed: 03/11/2023 4:34:24 PM By: [name] PA-C Entered By: [name] on 03/11/2023 13:34:24 -------------------------------------------------------------------------------- Physician Orders Details Patient Name: Date of Service: Brusca, [name] 03/11/2023 3:00 PM Medical Record Number: [id number] Patient Account Number: [id number] Date  of Birth/Sex: Treating RN: [birth year] (77 y.o. 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 [name] PA Wednesday ****extra time 60 minutes**** 03/18/2023 3pm Return A ppointment in 2 weeks. [name] PA Please ask front desk for appointments Return A Return appointment in 3 weeks. Leonard Schwartz Please ask front desk for appointments Return appointment in 1 month. - Please see front desk to schedule appointment Anesthetic (In clinic) Topical Lidocaine 5% applied to wound bed Bathing/ Shower/ Hygiene May shower with protection but do not get wound dressing(s) wet. Protect dressing(s) with water repellant cover (for example, large plastic bag) or a cast cover and may then take shower. Edema Control - Lymphedema / SCD / Other Lymphedema Pumps. Use Lymphedema pumps on leg(s)  would recommend that based on what we are seeing we go ahead and have the patient continue to monitor for any evidence of infection or worsening. I do believe that he is making excellent headway towards closure although it has been a long time coming I think he is actually doing what he needs to be doing at this point and this is helping keep the swelling under control much more effectively. 2. I am good recommend as well that we continue with the Aquacel Ag followed by the ABD pads to the open areas. We are using OPTi lock and the Urgo K2 compression wrap. 3. I am also can recommend he should continue the lymphedema pumps and elevate his legs this is still of utmost importance. We will see patient back for reevaluation in 1 week here in the clinic. If anything worsens or changes patient will contact our office for additional recommendations. Electronic Signature(s) Signed: 03/11/2023 4:35:02 PM By: [name] PA-C Entered By: [name] on 03/11/2023 13:35:02 -------------------------------------------------------------------------------- SuperBill Details Patient Name: Date of Service: Peregrina, [name] 03/11/2023 Medical Record Number: [id number] Patient Account Number: [id number] Date of Birth/Sex: Treating RN: 07/03/1945 (77 y.o. 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 with other  specified severity L84 Corns and callosities Facility Procedures : CPT4 Code: 78295621 Description: 11042 - DEB SUBQ TISSUE 20 SQ CM/< ICD-10 Diagnosis Description L97.828 Non-pressure chronic ulcer of other part of left lower leg with other specified L97.818 Non-pressure chronic ulcer of other part of right lower leg with other specified Modifier: severity severity Quantity: 1 : CPT4 Code: 30865784 Description: 11045 - DEB SUBQ TISS EA ADDL 20CM ICD-10 Diagnosis Description L97.828 Non-pressure chronic ulcer of other part of left lower leg with other specified L97.818 Non-pressure chronic ulcer of other part of right lower leg with other specified Modifier: severity severity Quantity: 2 Physician Procedures : CPT4 Code Description Modifier [id number] 11042 - WC PHYS SUBQ TISS 20 SQ CM ICD-10 Diagnosis Description 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 Quantity: 1 : [id number] 11045 - WC PHYS SUBQ TISS EA ADDL 20 CM ICD-10 Diagnosis Description 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 Peri-Wound Care: Sween Lotion (Moisturizing lotion) 1 x Per Week/30 Days Discharge Instructions: Apply moisturizing lotion as directed Prim Dressing: Aquacel Ag 1 x Per Week/30 Days ary Discharge Instructions: apply directly to wound bed. Secondary Dressing: ABD Pad, 8x10 1 x Per  Week/30 Days Discharge Instructions: Apply over primary dressing as directed. Secondary Dressing: OptiLock Super Absorbent, 5x5.5 (in/in) (Generic) 1 x Per Week/30 Days Discharge Instructions: Apply directly to wound bed as directed Compression Wrap: Urgo K2, (equivalent to a 4 layer) two layer compression system, regular 1 x Per Week/30 Days Discharge Instructions: Apply Urgo K2 as directed (alternative to 4 layer compression). Electronic Signature(s) Signed: 03/11/2023 4:45:44 PM By: [name] PA-C Signed: 03/11/2023 5:00:37 PM By: [name] RN Entered By: [name] on 03/11/2023 12:52:21 Tippets, [name] [id number] Page 10 of 17 -------------------------------------------------------------------------------- Problem List Details Patient Name: Date of Service: Gonterman, [name] 03/11/2023 3:00 PM Medical Record Number: [id number] Patient Account Number: [id number] Date of Birth/Sex: Treating RN: 08-30-1945 (77 y.o. 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/11/2022 No Yes bilateral lower extremity I89.0 Lymphedema, not elsewhere classified 03/26/2022 No Yes L97.828 Non-pressure chronic ulcer of other part of left lower leg with other specified 03/26/2022 No Yes severity L97.818 Non-pressure chronic ulcer of other part of right lower leg with other specified 03/26/2022 No Yes severity L84 Corns and callosities 10/15/2022 No Yes Inactive Problems Resolved Problems Electronic Signature(s) Signed: 03/11/2023 2:41:03 PM By: [name] PA-C Entered By: [name] on 03/11/2023  11:41:03 -------------------------------------------------------------------------------- Progress Note Details Patient Name: Date of Service: Azer, [name] 03/11/2023 3:00 PM Medical Record Number: [id number] Patient Account Number: [id number] Date of Birth/Sex: Treating RN: [birth year] (77 y.o. M) Primary Care Provider: [name] Other Clinician: Referring Provider: Treating Provider/Extender: [name] Weeks in Treatment: 3 Subjective Chief Complaint Information obtained from Patient 02/23/2020; patient is here for wounds on his bilateral lower legs in the setting of severe lymphedema 03/26/2022; patient is here for wounds on his bilateral lower legs medial aspect Eckert, Breion ([id number]) [id number] Page 11 of 17 History of Present Illness (HPI) ADMISSION 02/23/2020 Patient is a 77 year old man who lives in [place] who arrives accompanied by his wife. He has a history of chronic lymphedema and venous insufficiency in his bilateral lower legs which may have something to do that with having a history of DVT as well as being treated for prostate cancer. In any case he recently got compression pumps at home but compliance has been an issue here. He has compression stockings however they are probably not sufficient enough to control swelling. They tell us that things deteriorated for him in late August he was admitted to [facility] for 7 days. This was with cellulitis I think of his bilateral lower legs. Discharge he was noted to have wounds on his bilateral lower legs. He was discharged on Bactrim. They tried to get him home health through [facility] of course they declined him. His wife is been wrapping these applying some form of silver foam dressing. He has a history of wounds before although  would recommend that based on what we are seeing we go ahead and have the patient continue to monitor for any evidence of infection or worsening. I do believe that he is making excellent headway towards closure although it has been a long time coming I think he is actually doing what he needs to be doing at this point and this is helping keep the swelling under control much more effectively. 2. I am good recommend as well that we continue with the Aquacel Ag followed by the ABD pads to the open areas. We are using OPTi lock and the Urgo K2 compression wrap. 3. I am also can recommend he should continue the lymphedema pumps and elevate his legs this is still of utmost importance. We will see patient back for reevaluation in 1 week here in the clinic. If anything worsens or changes patient will contact our office for additional recommendations. Electronic Signature(s) Signed: 03/11/2023 4:35:02 PM By: [name] PA-C Entered By: [name] on 03/11/2023 13:35:02 -------------------------------------------------------------------------------- SuperBill Details Patient Name: Date of Service: Peregrina, [name] 03/11/2023 Medical Record Number: [id number] Patient Account Number: [id number] Date of Birth/Sex: Treating RN: 07/03/1945 (77 y.o. 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 with other  specified severity L84 Corns and callosities Facility Procedures : CPT4 Code: 78295621 Description: 11042 - DEB SUBQ TISSUE 20 SQ CM/< ICD-10 Diagnosis Description L97.828 Non-pressure chronic ulcer of other part of left lower leg with other specified L97.818 Non-pressure chronic ulcer of other part of right lower leg with other specified Modifier: severity severity Quantity: 1 : CPT4 Code: 30865784 Description: 11045 - DEB SUBQ TISS EA ADDL 20CM ICD-10 Diagnosis Description L97.828 Non-pressure chronic ulcer of other part of left lower leg with other specified L97.818 Non-pressure chronic ulcer of other part of right lower leg with other specified Modifier: severity severity Quantity: 2 Physician Procedures : CPT4 Code Description Modifier [id number] 11042 - WC PHYS SUBQ TISS 20 SQ CM ICD-10 Diagnosis Description 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 Quantity: 1 : [id number] 11045 - WC PHYS SUBQ TISS EA ADDL 20 CM ICD-10 Diagnosis Description 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 M) Primary Care Provider: [name] Other Clinician: Referring Provider: Treating Provider/Extender: [name] Weeks in Treatment: 23 Information Obtained from: Patient Chief Complaint 02/23/2020; patient is here for wounds on his bilateral lower legs in the setting of severe lymphedema 03/26/2022; patient is here for wounds on his bilateral lower legs medial aspect Electronic Signature(s) Signed: 03/11/2023 2:41:19 PM By: [name] PA-C Entered By: [name] on 03/11/2023 11:41:19 -------------------------------------------------------------------------------- Debridement Details Patient Name: Date of Service: Mance, [name] 03/11/2023 3:00 PM Medical Record Number: [id number] Patient Account Number: [id number] Date of Birth/Sex: Treating RN: March 11, 1946 (77 y.o. [name]: [name] Other Clinician: Referring Provider: Treating Provider/Extender: [name] Weeks in Treatment: 50 Debridement Performed for Assessment: Wound #12 Left,Anterior Lower Leg Performed By: Physician [name], PA The following information was scribed by: [name] The information was scribed for: [name] Debridement Type: Debridement Level of Consciousness (Pre-procedure): Awake and Alert Pre-procedure Verification/Time Out Yes - 15:40 Taken: Start Time: 15:40 Pain Control: Lidocaine 4% T opical Solution Percent of Wound Bed Debrided: 100% T Area Debrided (cm): otal 0.39 Tissue and other material debrided: Viable, Non-Viable, Slough, Subcutaneous, Skin: Dermis ,  Skin: Epidermis, Slough Level: Skin/Subcutaneous Tissue Debridement Description: Excisional Instrument: Curette Bleeding: Minimum Hemostasis Achieved: Pressure Response to Treatment: Procedure was tolerated well Level of Consciousness (Post- Awake and Alert procedure): Post Debridement Measurements of Total Wound Length: (cm) 1 Width: (cm) 0.5 Depth: (cm) 0.1 Volume: (cm) 0.039 Character of Wound/Ulcer Post Debridement: Improved Fuller, Deunte ([id number]) [id number] Page 2 of 17 Post Procedure Diagnosis Same as Pre-procedure Electronic Signature(s) Signed: 03/11/2023 4:45:44 PM By: [name] PA-C Signed: 03/11/2023 5:00:37 PM By: [name] RN Entered By: [name] on 03/11/2023 12:59:24 -------------------------------------------------------------------------------- Debridement Details Patient Name: Date of Service: Raney, [name] 03/11/2023 3:00 PM Medical Record Number: [id number] Patient Account Number: [id number] Date of Birth/Sex: Treating RN: [birth year] (77 y.o. [name]: [name] Other Clinician: Referring Provider: Treating Provider/Extender: [name] Weeks in Treatment: 50 Debridement Performed for Assessment: Wound #6 Right,Medial Lower Leg Performed By: Physician [name], PA The following information was scribed by: [name] The information was scribed for: [name] Debridement Type: Debridement Level of Consciousness (Pre-procedure): Awake and Alert Pre-procedure Verification/Time Out Yes - 15:40 Taken: Start Time: 15:40 Pain Control: Lidocaine 4% T opical Solution Percent of Wound Bed Debrided: 100% T Area Debrided (cm): otal 28.26 Tissue and other material debrided: Viable, Non-Viable, Slough, Subcutaneous, Skin: Dermis , Skin: Epidermis, Slough Level: Skin/Subcutaneous Tissue Debridement Description: Excisional Instrument:  Curette Bleeding: Minimum Hemostasis Achieved: Pressure Response to Treatment: Procedure was tolerated well Level of Consciousness (Post- Awake and Alert procedure): Post Debridement Measurements of Total Wound Length: (cm) 8 Width: (cm) 4.5 Depth: (cm) 0.4 Volume: (cm) 11.31 Character of Wound/Ulcer Post Debridement: Improved Post Procedure Diagnosis Same as Pre-procedure Electronic Signature(s) Signed: 03/11/2023 4:45:44 PM By: [name] PA-C Signed: 03/11/2023 5:00:37 PM By: [name] RN Entered By: [name] on 03/11/2023 12:59:36 -------------------------------------------------------------------------------- Debridement Details Patient Name: Date of Service: Nealis, [name] 03/11/2023 3:00 PM Medical Record Number: [id number] Patient Account Number: [id number] Stockard,  Posey, [name] ([id number]) [id number] Page 1 of 17 Visit Report for 03/11/2023 Chief Complaint Document Details Patient Name: Date of Service: Schulte, [name] 03/11/2023 3:00 PM Medical Record Number: [id number] Patient Account Number: [id number] Date of Birth/Sex: Treating RN: 1945/05/29 (77 y.o. M) Primary Care Provider: [name] Other Clinician: Referring Provider: Treating Provider/Extender: [name] Weeks in Treatment: 23 Information Obtained from: Patient Chief Complaint 02/23/2020; patient is here for wounds on his bilateral lower legs in the setting of severe lymphedema 03/26/2022; patient is here for wounds on his bilateral lower legs medial aspect Electronic Signature(s) Signed: 03/11/2023 2:41:19 PM By: [name] PA-C Entered By: [name] on 03/11/2023 11:41:19 -------------------------------------------------------------------------------- Debridement Details Patient Name: Date of Service: Mance, [name] 03/11/2023 3:00 PM Medical Record Number: [id number] Patient Account Number: [id number] Date of Birth/Sex: Treating RN: March 11, 1946 (77 y.o. [name]: [name] Other Clinician: Referring Provider: Treating Provider/Extender: [name] Weeks in Treatment: 50 Debridement Performed for Assessment: Wound #12 Left,Anterior Lower Leg Performed By: Physician [name], PA The following information was scribed by: [name] The information was scribed for: [name] Debridement Type: Debridement Level of Consciousness (Pre-procedure): Awake and Alert Pre-procedure Verification/Time Out Yes - 15:40 Taken: Start Time: 15:40 Pain Control: Lidocaine 4% T opical Solution Percent of Wound Bed Debrided: 100% T Area Debrided (cm): otal 0.39 Tissue and other material debrided: Viable, Non-Viable, Slough, Subcutaneous, Skin: Dermis ,  Skin: Epidermis, Slough Level: Skin/Subcutaneous Tissue Debridement Description: Excisional Instrument: Curette Bleeding: Minimum Hemostasis Achieved: Pressure Response to Treatment: Procedure was tolerated well Level of Consciousness (Post- Awake and Alert procedure): Post Debridement Measurements of Total Wound Length: (cm) 1 Width: (cm) 0.5 Depth: (cm) 0.1 Volume: (cm) 0.039 Character of Wound/Ulcer Post Debridement: Improved Fuller, Deunte ([id number]) [id number] Page 2 of 17 Post Procedure Diagnosis Same as Pre-procedure Electronic Signature(s) Signed: 03/11/2023 4:45:44 PM By: [name] PA-C Signed: 03/11/2023 5:00:37 PM By: [name] RN Entered By: [name] on 03/11/2023 12:59:24 -------------------------------------------------------------------------------- Debridement Details Patient Name: Date of Service: Raney, [name] 03/11/2023 3:00 PM Medical Record Number: [id number] Patient Account Number: [id number] Date of Birth/Sex: Treating RN: [birth year] (77 y.o. [name]: [name] Other Clinician: Referring Provider: Treating Provider/Extender: [name] Weeks in Treatment: 50 Debridement Performed for Assessment: Wound #6 Right,Medial Lower Leg Performed By: Physician [name], PA The following information was scribed by: [name] The information was scribed for: [name] Debridement Type: Debridement Level of Consciousness (Pre-procedure): Awake and Alert Pre-procedure Verification/Time Out Yes - 15:40 Taken: Start Time: 15:40 Pain Control: Lidocaine 4% T opical Solution Percent of Wound Bed Debrided: 100% T Area Debrided (cm): otal 28.26 Tissue and other material debrided: Viable, Non-Viable, Slough, Subcutaneous, Skin: Dermis , Skin: Epidermis, Slough Level: Skin/Subcutaneous Tissue Debridement Description: Excisional Instrument:  Curette Bleeding: Minimum Hemostasis Achieved: Pressure Response to Treatment: Procedure was tolerated well Level of Consciousness (Post- Awake and Alert procedure): Post Debridement Measurements of Total Wound Length: (cm) 8 Width: (cm) 4.5 Depth: (cm) 0.4 Volume: (cm) 11.31 Character of Wound/Ulcer Post Debridement: Improved Post Procedure Diagnosis Same as Pre-procedure Electronic Signature(s) Signed: 03/11/2023 4:45:44 PM By: [name] PA-C Signed: 03/11/2023 5:00:37 PM By: [name] RN Entered By: [name] on 03/11/2023 12:59:36 -------------------------------------------------------------------------------- Debridement Details Patient Name: Date of Service: Nealis, [name] 03/11/2023 3:00 PM Medical Record Number: [id number] Patient Account Number: [id number] Stockard,  Leg Wound Laterality: Left, Medial Cleanser: Soap and Water 1 x Per Week/30 Days Discharge Instructions: May shower and wash wound with dial antibacterial soap and water prior to dressing change. Peri-Wound Care: Sween Lotion (Moisturizing lotion) 1 x Per Week/30 Days Discharge Instructions: Apply moisturizing lotion as directed Prim Dressing: Aquacel Ag 1 x Per Week/30 Days ary Discharge Instructions: apply directly to wound bed. Secondary Dressing: ABD Pad, 8x10 1 x Per  Week/30 Days Discharge Instructions: Apply over primary dressing as directed. Secondary Dressing: OptiLock Super Absorbent, 5x5.5 (in/in) (Generic) 1 x Per Week/30 Days Discharge Instructions: Apply directly to wound bed as directed Compression Wrap: Urgo K2, (equivalent to a 4 layer) two layer compression system, regular 1 x Per Week/30 Days Discharge Instructions: Apply Urgo K2 as directed (alternative to 4 layer compression). Electronic Signature(s) Signed: 03/11/2023 4:45:44 PM By: [name] PA-C Signed: 03/11/2023 5:00:37 PM By: [name] RN Entered By: [name] on 03/11/2023 12:52:21 Tippets, [name] [id number] Page 10 of 17 -------------------------------------------------------------------------------- Problem List Details Patient Name: Date of Service: Gonterman, [name] 03/11/2023 3:00 PM Medical Record Number: [id number] Patient Account Number: [id number] Date of Birth/Sex: Treating RN: 08-30-1945 (77 y.o. 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/11/2022 No Yes bilateral lower extremity I89.0 Lymphedema, not elsewhere classified 03/26/2022 No Yes L97.828 Non-pressure chronic ulcer of other part of left lower leg with other specified 03/26/2022 No Yes severity L97.818 Non-pressure chronic ulcer of other part of right lower leg with other specified 03/26/2022 No Yes severity L84 Corns and callosities 10/15/2022 No Yes Inactive Problems Resolved Problems Electronic Signature(s) Signed: 03/11/2023 2:41:03 PM By: [name] PA-C Entered By: [name] on 03/11/2023  11:41:03 -------------------------------------------------------------------------------- Progress Note Details Patient Name: Date of Service: Azer, [name] 03/11/2023 3:00 PM Medical Record Number: [id number] Patient Account Number: [id number] Date of Birth/Sex: Treating RN: [birth year] (77 y.o. M) Primary Care Provider: [name] Other Clinician: Referring Provider: Treating Provider/Extender: [name] Weeks in Treatment: 3 Subjective Chief Complaint Information obtained from Patient 02/23/2020; patient is here for wounds on his bilateral lower legs in the setting of severe lymphedema 03/26/2022; patient is here for wounds on his bilateral lower legs medial aspect Eckert, Breion ([id number]) [id number] Page 11 of 17 History of Present Illness (HPI) ADMISSION 02/23/2020 Patient is a 77 year old man who lives in [place] who arrives accompanied by his wife. He has a history of chronic lymphedema and venous insufficiency in his bilateral lower legs which may have something to do that with having a history of DVT as well as being treated for prostate cancer. In any case he recently got compression pumps at home but compliance has been an issue here. He has compression stockings however they are probably not sufficient enough to control swelling. They tell us that things deteriorated for him in late August he was admitted to [facility] for 7 days. This was with cellulitis I think of his bilateral lower legs. Discharge he was noted to have wounds on his bilateral lower legs. He was discharged on Bactrim. They tried to get him home health through [facility] of course they declined him. His wife is been wrapping these applying some form of silver foam dressing. He has a history of wounds before although  [name] [id number] Page 3 of 17 Date of Birth/Sex: Treating RN: [birth year] (77 y.o. [name]: Other Clinician: [name] Referring Provider: Treating Provider/Extender: [name] Weeks in Treatment: 50 Debridement Performed for Assessment: Wound #7 Left,Medial Lower Leg Performed By: Physician [name], PA The following information was scribed by: [name] The information was scribed for: [name] Debridement Type: Debridement Level of Consciousness (Pre-procedure): Awake and Alert Pre-procedure Verification/Time Out Yes - 15:40 Taken: Start Time: 15:40 Pain Control: Lidocaine 4% T opical Solution Percent of Wound Bed Debrided: 100% T Area Debrided (cm): otal 7.54 Tissue and other material debrided: Non-Viable, Slough, Subcutaneous, Skin: Dermis , Skin: Epidermis, Slough Level: Skin/Subcutaneous Tissue Debridement Description: Excisional Instrument: Curette Bleeding: Minimum Hemostasis Achieved: Pressure Response to Treatment: Procedure was tolerated well Level  of Consciousness (Post- Awake and Alert procedure): Post Debridement Measurements of Total Wound Length: (cm) 3 Width: (cm) 3.2 Depth: (cm) 0.1 Volume: (cm) 0.754 Character of Wound/Ulcer Post Debridement: Improved Post Procedure Diagnosis Same as Pre-procedure Electronic Signature(s) Signed: 03/11/2023 4:45:44 PM By: [name] PA-C Signed: 03/11/2023 5:00:37 PM By: [name] RN Entered By: [name] on 03/11/2023 12:59:59 -------------------------------------------------------------------------------- Debridement Details Patient Name: Date of Service: Delangel, [name] 03/11/2023 3:00 PM Medical Record Number: [id number] Patient Account Number: [id number] Date of Birth/Sex: Treating RN: [birth year] (77 y.o. [name]: [name] Other Clinician: Referring Provider: Treating Provider/Extender: [name] Weeks in Treatment: 50 Debridement Performed for Assessment: Wound #15 Left,Distal,Posterior Lower Leg Performed By: Physician [name], PA The following information was scribed by: [name] The information was scribed for: [name] Debridement Type: Debridement Level of Consciousness (Pre-procedure): Awake and Alert Pre-procedure Verification/Time Out Yes - 15:40 Taken: Start Time: 15:40 Pain Control: Lidocaine 4% T opical Solution Percent of Wound Bed Debrided: 100% T Area Debrided (cm): otal 4.95 Tissue and other material debrided: Viable, Non-Viable, Slough, Subcutaneous, Skin: Dermis , Skin: Epidermis, Slough Level: Skin/Subcutaneous Tissue Debridement Description: Excisional Instrument: Curette Bleeding: Minimum Hemostasis Achieved: Pressure Mates, Kyriakos ([id number]) [id number] Page 4 of 17 Response to Treatment: Procedure was tolerated well Level of Consciousness (Post- Awake and Alert procedure): Post Debridement Measurements of Total  Wound Length: (cm) 3 Width: (cm) 2.1 Depth: (cm) 0.1 Volume: (cm) 0.495 Character of Wound/Ulcer Post Debridement: Improved Post Procedure Diagnosis Same as Pre-procedure Electronic Signature(s) Signed: 03/11/2023 4:45:44 PM By: [name] PA-C Signed: 03/11/2023 5:00:37 PM By: [name] RN Entered By: [name] on 03/11/2023 13:01:42 -------------------------------------------------------------------------------- HPI Details Patient Name: Date of Service: Yarbrough, [name] 03/11/2023 3:00 PM Medical Record Number: [id number] Patient Account Number: [id number] Date of Birth/Sex: Treating RN: 1945/06/12 (78 y.o. M) Primary Care Provider: [name] Other Clinician: Referring Provider: Treating Provider/Extender: [name] Weeks in Treatment: 25 History of Present Illness HPI Description: ADMISSION 02/23/2020 Patient is a 77 year old man who lives in [place] who arrives accompanied by his wife. He has a history of chronic lymphedema and venous insufficiency in his bilateral lower legs which may have something to do that with having a history of DVT as well as being treated for prostate cancer. In any case he recently got compression pumps at home but compliance has been an issue here. He has compression stockings however they are probably not sufficient enough to control swelling. They tell us that things deteriorated for him in late August he was admitted to  Posey, [name] ([id number]) [id number] Page 1 of 17 Visit Report for 03/11/2023 Chief Complaint Document Details Patient Name: Date of Service: Schulte, [name] 03/11/2023 3:00 PM Medical Record Number: [id number] Patient Account Number: [id number] Date of Birth/Sex: Treating RN: 1945/05/29 (77 y.o. M) Primary Care Provider: [name] Other Clinician: Referring Provider: Treating Provider/Extender: [name] Weeks in Treatment: 23 Information Obtained from: Patient Chief Complaint 02/23/2020; patient is here for wounds on his bilateral lower legs in the setting of severe lymphedema 03/26/2022; patient is here for wounds on his bilateral lower legs medial aspect Electronic Signature(s) Signed: 03/11/2023 2:41:19 PM By: [name] PA-C Entered By: [name] on 03/11/2023 11:41:19 -------------------------------------------------------------------------------- Debridement Details Patient Name: Date of Service: Mance, [name] 03/11/2023 3:00 PM Medical Record Number: [id number] Patient Account Number: [id number] Date of Birth/Sex: Treating RN: March 11, 1946 (77 y.o. [name]: [name] Other Clinician: Referring Provider: Treating Provider/Extender: [name] Weeks in Treatment: 50 Debridement Performed for Assessment: Wound #12 Left,Anterior Lower Leg Performed By: Physician [name], PA The following information was scribed by: [name] The information was scribed for: [name] Debridement Type: Debridement Level of Consciousness (Pre-procedure): Awake and Alert Pre-procedure Verification/Time Out Yes - 15:40 Taken: Start Time: 15:40 Pain Control: Lidocaine 4% T opical Solution Percent of Wound Bed Debrided: 100% T Area Debrided (cm): otal 0.39 Tissue and other material debrided: Viable, Non-Viable, Slough, Subcutaneous, Skin: Dermis ,  Skin: Epidermis, Slough Level: Skin/Subcutaneous Tissue Debridement Description: Excisional Instrument: Curette Bleeding: Minimum Hemostasis Achieved: Pressure Response to Treatment: Procedure was tolerated well Level of Consciousness (Post- Awake and Alert procedure): Post Debridement Measurements of Total Wound Length: (cm) 1 Width: (cm) 0.5 Depth: (cm) 0.1 Volume: (cm) 0.039 Character of Wound/Ulcer Post Debridement: Improved Fuller, Deunte ([id number]) [id number] Page 2 of 17 Post Procedure Diagnosis Same as Pre-procedure Electronic Signature(s) Signed: 03/11/2023 4:45:44 PM By: [name] PA-C Signed: 03/11/2023 5:00:37 PM By: [name] RN Entered By: [name] on 03/11/2023 12:59:24 -------------------------------------------------------------------------------- Debridement Details Patient Name: Date of Service: Raney, [name] 03/11/2023 3:00 PM Medical Record Number: [id number] Patient Account Number: [id number] Date of Birth/Sex: Treating RN: [birth year] (77 y.o. [name]: [name] Other Clinician: Referring Provider: Treating Provider/Extender: [name] Weeks in Treatment: 50 Debridement Performed for Assessment: Wound #6 Right,Medial Lower Leg Performed By: Physician [name], PA The following information was scribed by: [name] The information was scribed for: [name] Debridement Type: Debridement Level of Consciousness (Pre-procedure): Awake and Alert Pre-procedure Verification/Time Out Yes - 15:40 Taken: Start Time: 15:40 Pain Control: Lidocaine 4% T opical Solution Percent of Wound Bed Debrided: 100% T Area Debrided (cm): otal 28.26 Tissue and other material debrided: Viable, Non-Viable, Slough, Subcutaneous, Skin: Dermis , Skin: Epidermis, Slough Level: Skin/Subcutaneous Tissue Debridement Description: Excisional Instrument:  Curette Bleeding: Minimum Hemostasis Achieved: Pressure Response to Treatment: Procedure was tolerated well Level of Consciousness (Post- Awake and Alert procedure): Post Debridement Measurements of Total Wound Length: (cm) 8 Width: (cm) 4.5 Depth: (cm) 0.4 Volume: (cm) 11.31 Character of Wound/Ulcer Post Debridement: Improved Post Procedure Diagnosis Same as Pre-procedure Electronic Signature(s) Signed: 03/11/2023 4:45:44 PM By: [name] PA-C Signed: 03/11/2023 5:00:37 PM By: [name] RN Entered By: [name] on 03/11/2023 12:59:36 -------------------------------------------------------------------------------- Debridement Details Patient Name: Date of Service: Nealis, [name] 03/11/2023 3:00 PM Medical Record Number: [id number] Patient Account Number: [id number] Stockard,  Leg Wound Laterality: Left, Medial Cleanser: Soap and Water 1 x Per Week/30 Days Discharge Instructions: May shower and wash wound with dial antibacterial soap and water prior to dressing change. Peri-Wound Care: Sween Lotion (Moisturizing lotion) 1 x Per Week/30 Days Discharge Instructions: Apply moisturizing lotion as directed Prim Dressing: Aquacel Ag 1 x Per Week/30 Days ary Discharge Instructions: apply directly to wound bed. Secondary Dressing: ABD Pad, 8x10 1 x Per  Week/30 Days Discharge Instructions: Apply over primary dressing as directed. Secondary Dressing: OptiLock Super Absorbent, 5x5.5 (in/in) (Generic) 1 x Per Week/30 Days Discharge Instructions: Apply directly to wound bed as directed Compression Wrap: Urgo K2, (equivalent to a 4 layer) two layer compression system, regular 1 x Per Week/30 Days Discharge Instructions: Apply Urgo K2 as directed (alternative to 4 layer compression). Electronic Signature(s) Signed: 03/11/2023 4:45:44 PM By: [name] PA-C Signed: 03/11/2023 5:00:37 PM By: [name] RN Entered By: [name] on 03/11/2023 12:52:21 Tippets, [name] [id number] Page 10 of 17 -------------------------------------------------------------------------------- Problem List Details Patient Name: Date of Service: Gonterman, [name] 03/11/2023 3:00 PM Medical Record Number: [id number] Patient Account Number: [id number] Date of Birth/Sex: Treating RN: 08-30-1945 (77 y.o. 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/11/2022 No Yes bilateral lower extremity I89.0 Lymphedema, not elsewhere classified 03/26/2022 No Yes L97.828 Non-pressure chronic ulcer of other part of left lower leg with other specified 03/26/2022 No Yes severity L97.818 Non-pressure chronic ulcer of other part of right lower leg with other specified 03/26/2022 No Yes severity L84 Corns and callosities 10/15/2022 No Yes Inactive Problems Resolved Problems Electronic Signature(s) Signed: 03/11/2023 2:41:03 PM By: [name] PA-C Entered By: [name] on 03/11/2023  11:41:03 -------------------------------------------------------------------------------- Progress Note Details Patient Name: Date of Service: Azer, [name] 03/11/2023 3:00 PM Medical Record Number: [id number] Patient Account Number: [id number] Date of Birth/Sex: Treating RN: [birth year] (77 y.o. M) Primary Care Provider: [name] Other Clinician: Referring Provider: Treating Provider/Extender: [name] Weeks in Treatment: 3 Subjective Chief Complaint Information obtained from Patient 02/23/2020; patient is here for wounds on his bilateral lower legs in the setting of severe lymphedema 03/26/2022; patient is here for wounds on his bilateral lower legs medial aspect Eckert, Breion ([id number]) [id number] Page 11 of 17 History of Present Illness (HPI) ADMISSION 02/23/2020 Patient is a 77 year old man who lives in [place] who arrives accompanied by his wife. He has a history of chronic lymphedema and venous insufficiency in his bilateral lower legs which may have something to do that with having a history of DVT as well as being treated for prostate cancer. In any case he recently got compression pumps at home but compliance has been an issue here. He has compression stockings however they are probably not sufficient enough to control swelling. They tell us that things deteriorated for him in late August he was admitted to [facility] for 7 days. This was with cellulitis I think of his bilateral lower legs. Discharge he was noted to have wounds on his bilateral lower legs. He was discharged on Bactrim. They tried to get him home health through [facility] of course they declined him. His wife is been wrapping these applying some form of silver foam dressing. He has a history of wounds before although  would recommend that based on what we are seeing we go ahead and have the patient continue to monitor for any evidence of infection or worsening. I do believe that he is making excellent headway towards closure although it has been a long time coming I think he is actually doing what he needs to be doing at this point and this is helping keep the swelling under control much more effectively. 2. I am good recommend as well that we continue with the Aquacel Ag followed by the ABD pads to the open areas. We are using OPTi lock and the Urgo K2 compression wrap. 3. I am also can recommend he should continue the lymphedema pumps and elevate his legs this is still of utmost importance. We will see patient back for reevaluation in 1 week here in the clinic. If anything worsens or changes patient will contact our office for additional recommendations. Electronic Signature(s) Signed: 03/11/2023 4:35:02 PM By: [name] PA-C Entered By: [name] on 03/11/2023 13:35:02 -------------------------------------------------------------------------------- SuperBill Details Patient Name: Date of Service: Peregrina, [name] 03/11/2023 Medical Record Number: [id number] Patient Account Number: [id number] Date of Birth/Sex: Treating RN: 07/03/1945 (77 y.o. 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 with other  specified severity L84 Corns and callosities Facility Procedures : CPT4 Code: [id number] Description: 11042 - DEB SUBQ TISSUE 20 SQ CM/< ICD-10 Diagnosis Description L97.828 Non-pressure chronic ulcer of other part of left lower leg with other specified L97.818 Non-pressure chronic ulcer of other part of right lower leg with other specified Modifier: severity severity Quantity: 1 : CPT4 Code: 30865784 Description: 11045 - DEB SUBQ TISS EA ADDL 20CM ICD-10 Diagnosis Description L97.828 Non-pressure chronic ulcer of other part of left lower leg with other specified 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 of other part of right lower leg with other specified Modifier: severity severity Quantity: 2 Physician Procedures : CPT4 Code Description Modifier [id number] 11042 - WC PHYS SUBQ TISS 20 SQ CM ICD-10 Diagnosis Description 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 Quantity: 1 : [id number] 11045 - WC PHYS SUBQ TISS EA ADDL 20 CM ICD-10 Diagnosis Description 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 Peri-Wound Care: Sween Lotion (Moisturizing lotion) 1 x Per Week/30 Days Discharge Instructions: Apply moisturizing lotion as directed Prim Dressing: Aquacel Ag 1 x Per Week/30 Days ary Discharge Instructions: apply directly to wound bed. Secondary Dressing: ABD Pad, 8x10 1 x Per  Week/30 Days Discharge Instructions: Apply over primary dressing as directed. Secondary Dressing: OptiLock Super Absorbent, 5x5.5 (in/in) (Generic) 1 x Per Week/30 Days Discharge Instructions: Apply directly to wound bed as directed Compression Wrap: Urgo K2, (equivalent to a 4 layer) two layer compression system, regular 1 x Per Week/30 Days Discharge Instructions: Apply Urgo K2 as directed (alternative to 4 layer compression). Electronic Signature(s) Signed: 03/11/2023 4:45:44 PM By: [name] PA-C Signed: 03/11/2023 5:00:37 PM By: [name] RN Entered By: [name] on 03/11/2023 12:52:21 Tippets, [name] [id number] Page 10 of 17 -------------------------------------------------------------------------------- Problem List Details Patient Name: Date of Service: Gonterman, [name] 03/11/2023 3:00 PM Medical Record Number: [id number] Patient Account Number: [id number] Date of Birth/Sex: Treating RN: 08-30-1945 (77 y.o. 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/11/2022 No Yes bilateral lower extremity I89.0 Lymphedema, not elsewhere classified 03/26/2022 No Yes L97.828 Non-pressure chronic ulcer of other part of left lower leg with other specified 03/26/2022 No Yes severity L97.818 Non-pressure chronic ulcer of other part of right lower leg with other specified 03/26/2022 No Yes severity L84 Corns and callosities 10/15/2022 No Yes Inactive Problems Resolved Problems Electronic Signature(s) Signed: 03/11/2023 2:41:03 PM By: [name] PA-C Entered By: [name] on 03/11/2023  11:41:03 -------------------------------------------------------------------------------- Progress Note Details Patient Name: Date of Service: Azer, [name] 03/11/2023 3:00 PM Medical Record Number: [id number] Patient Account Number: [id number] Date of Birth/Sex: Treating RN: [birth year] (77 y.o. M) Primary Care Provider: [name] Other Clinician: Referring Provider: Treating Provider/Extender: [name] Weeks in Treatment: 3 Subjective Chief Complaint Information obtained from Patient 02/23/2020; patient is here for wounds on his bilateral lower legs in the setting of severe lymphedema 03/26/2022; patient is here for wounds on his bilateral lower legs medial aspect Eckert, Breion ([id number]) [id number] Page 11 of 17 History of Present Illness (HPI) ADMISSION 02/23/2020 Patient is a 77 year old man who lives in [place] who arrives accompanied by his wife. He has a history of chronic lymphedema and venous insufficiency in his bilateral lower legs which may have something to do that with having a history of DVT as well as being treated for prostate cancer. In any case he recently got compression pumps at home but compliance has been an issue here. He has compression stockings however they are probably not sufficient enough to control swelling. They tell us that things deteriorated for him in late August he was admitted to [facility] for 7 days. This was with cellulitis I think of his bilateral lower legs. Discharge he was noted to have wounds on his bilateral lower legs. He was discharged on Bactrim. They tried to get him home health through [facility] of course they declined him. His wife is been wrapping these applying some form of silver foam dressing. He has a history of wounds before although  Leg Wound Laterality: Left, Medial Cleanser: Soap and Water 1 x Per Week/30 Days Discharge Instructions: May shower and wash wound with dial antibacterial soap and water prior to dressing change. Peri-Wound Care: Sween Lotion (Moisturizing lotion) 1 x Per Week/30 Days Discharge Instructions: Apply moisturizing lotion as directed Prim Dressing: Aquacel Ag 1 x Per Week/30 Days ary Discharge Instructions: apply directly to wound bed. Secondary Dressing: ABD Pad, 8x10 1 x Per  Week/30 Days Discharge Instructions: Apply over primary dressing as directed. Secondary Dressing: OptiLock Super Absorbent, 5x5.5 (in/in) (Generic) 1 x Per Week/30 Days Discharge Instructions: Apply directly to wound bed as directed Compression Wrap: Urgo K2, (equivalent to a 4 layer) two layer compression system, regular 1 x Per Week/30 Days Discharge Instructions: Apply Urgo K2 as directed (alternative to 4 layer compression). Electronic Signature(s) Signed: 03/11/2023 4:45:44 PM By: [name] PA-C Signed: 03/11/2023 5:00:37 PM By: [name] RN Entered By: [name] on 03/11/2023 12:52:21 Tippets, [name] [id number] Page 10 of 17 -------------------------------------------------------------------------------- Problem List Details Patient Name: Date of Service: Gonterman, [name] 03/11/2023 3:00 PM Medical Record Number: [id number] Patient Account Number: [id number] Date of Birth/Sex: Treating RN: 08-30-1945 (77 y.o. 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/11/2022 No Yes bilateral lower extremity I89.0 Lymphedema, not elsewhere classified 03/26/2022 No Yes L97.828 Non-pressure chronic ulcer of other part of left lower leg with other specified 03/26/2022 No Yes severity L97.818 Non-pressure chronic ulcer of other part of right lower leg with other specified 03/26/2022 No Yes severity L84 Corns and callosities 10/15/2022 No Yes Inactive Problems Resolved Problems Electronic Signature(s) Signed: 03/11/2023 2:41:03 PM By: [name] PA-C Entered By: [name] on 03/11/2023  11:41:03 -------------------------------------------------------------------------------- Progress Note Details Patient Name: Date of Service: Azer, [name] 03/11/2023 3:00 PM Medical Record Number: [id number] Patient Account Number: [id number] Date of Birth/Sex: Treating RN: [birth year] (77 y.o. M) Primary Care Provider: [name] Other Clinician: Referring Provider: Treating Provider/Extender: [name] Weeks in Treatment: 3 Subjective Chief Complaint Information obtained from Patient 02/23/2020; patient is here for wounds on his bilateral lower legs in the setting of severe lymphedema 03/26/2022; patient is here for wounds on his bilateral lower legs medial aspect Eckert, Breion ([id number]) [id number] Page 11 of 17 History of Present Illness (HPI) ADMISSION 02/23/2020 Patient is a 77 year old man who lives in [place] who arrives accompanied by his wife. He has a history of chronic lymphedema and venous insufficiency in his bilateral lower legs which may have something to do that with having a history of DVT as well as being treated for prostate cancer. In any case he recently got compression pumps at home but compliance has been an issue here. He has compression stockings however they are probably not sufficient enough to control swelling. They tell us that things deteriorated for him in late August he was admitted to [facility] for 7 days. This was with cellulitis I think of his bilateral lower legs. Discharge he was noted to have wounds on his bilateral lower legs. He was discharged on Bactrim. They tried to get him home health through [facility] of course they declined him. His wife is been wrapping these applying some form of silver foam dressing. He has a history of wounds before although  [name] [id number] Page 3 of 17 Date of Birth/Sex: Treating RN: [birth year] (77 y.o. [name]: Other Clinician: [name] Referring Provider: Treating Provider/Extender: [name] Weeks in Treatment: 50 Debridement Performed for Assessment: Wound #7 Left,Medial Lower Leg Performed By: Physician [name], PA The following information was scribed by: [name] The information was scribed for: [name] Debridement Type: Debridement Level of Consciousness (Pre-procedure): Awake and Alert Pre-procedure Verification/Time Out Yes - 15:40 Taken: Start Time: 15:40 Pain Control: Lidocaine 4% T opical Solution Percent of Wound Bed Debrided: 100% T Area Debrided (cm): otal 7.54 Tissue and other material debrided: Non-Viable, Slough, Subcutaneous, Skin: Dermis , Skin: Epidermis, Slough Level: Skin/Subcutaneous Tissue Debridement Description: Excisional Instrument: Curette Bleeding: Minimum Hemostasis Achieved: Pressure Response to Treatment: Procedure was tolerated well Level  of Consciousness (Post- Awake and Alert procedure): Post Debridement Measurements of Total Wound Length: (cm) 3 Width: (cm) 3.2 Depth: (cm) 0.1 Volume: (cm) 0.754 Character of Wound/Ulcer Post Debridement: Improved Post Procedure Diagnosis Same as Pre-procedure Electronic Signature(s) Signed: 03/11/2023 4:45:44 PM By: [name] PA-C Signed: 03/11/2023 5:00:37 PM By: [name] RN Entered By: [name] on 03/11/2023 12:59:59 -------------------------------------------------------------------------------- Debridement Details Patient Name: Date of Service: Delangel, [name] 03/11/2023 3:00 PM Medical Record Number: [id number] Patient Account Number: [id number] Date of Birth/Sex: Treating RN: [birth year] (77 y.o. [name]: [name] Other Clinician: Referring Provider: Treating Provider/Extender: [name] Weeks in Treatment: 50 Debridement Performed for Assessment: Wound #15 Left,Distal,Posterior Lower Leg Performed By: Physician [name], PA The following information was scribed by: [name] The information was scribed for: [name] Debridement Type: Debridement Level of Consciousness (Pre-procedure): Awake and Alert Pre-procedure Verification/Time Out Yes - 15:40 Taken: Start Time: 15:40 Pain Control: Lidocaine 4% T opical Solution Percent of Wound Bed Debrided: 100% T Area Debrided (cm): otal 4.95 Tissue and other material debrided: Viable, Non-Viable, Slough, Subcutaneous, Skin: Dermis , Skin: Epidermis, Slough Level: Skin/Subcutaneous Tissue Debridement Description: Excisional Instrument: Curette Bleeding: Minimum Hemostasis Achieved: Pressure Mates, Kyriakos ([id number]) [id number] Page 4 of 17 Response to Treatment: Procedure was tolerated well Level of Consciousness (Post- Awake and Alert procedure): Post Debridement Measurements of Total  Wound Length: (cm) 3 Width: (cm) 2.1 Depth: (cm) 0.1 Volume: (cm) 0.495 Character of Wound/Ulcer Post Debridement: Improved Post Procedure Diagnosis Same as Pre-procedure Electronic Signature(s) Signed: 03/11/2023 4:45:44 PM By: [name] PA-C Signed: 03/11/2023 5:00:37 PM By: [name] RN Entered By: [name] on 03/11/2023 13:01:42 -------------------------------------------------------------------------------- HPI Details Patient Name: Date of Service: Yarbrough, [name] 03/11/2023 3:00 PM Medical Record Number: [id number] Patient Account Number: [id number] Date of Birth/Sex: Treating RN: 1945/06/12 (78 y.o. M) Primary Care Provider: [name] Other Clinician: Referring Provider: Treating Provider/Extender: [name] Weeks in Treatment: 25 History of Present Illness HPI Description: ADMISSION 02/23/2020 Patient is a 77 year old man who lives in [place] who arrives accompanied by his wife. He has a history of chronic lymphedema and venous insufficiency in his bilateral lower legs which may have something to do that with having a history of DVT as well as being treated for prostate cancer. In any case he recently got compression pumps at home but compliance has been an issue here. He has compression stockings however they are probably not sufficient enough to control swelling. They tell us that things deteriorated for him in late August he was admitted to  Leg Wound Laterality: Left, Medial Cleanser: Soap and Water 1 x Per Week/30 Days Discharge Instructions: May shower and wash wound with dial antibacterial soap and water prior to dressing change. Peri-Wound Care: Sween Lotion (Moisturizing lotion) 1 x Per Week/30 Days Discharge Instructions: Apply moisturizing lotion as directed Prim Dressing: Aquacel Ag 1 x Per Week/30 Days ary Discharge Instructions: apply directly to wound bed. Secondary Dressing: ABD Pad, 8x10 1 x Per  Week/30 Days Discharge Instructions: Apply over primary dressing as directed. Secondary Dressing: OptiLock Super Absorbent, 5x5.5 (in/in) (Generic) 1 x Per Week/30 Days Discharge Instructions: Apply directly to wound bed as directed Compression Wrap: Urgo K2, (equivalent to a 4 layer) two layer compression system, regular 1 x Per Week/30 Days Discharge Instructions: Apply Urgo K2 as directed (alternative to 4 layer compression). Electronic Signature(s) Signed: 03/11/2023 4:45:44 PM By: [name] PA-C Signed: 03/11/2023 5:00:37 PM By: [name] RN Entered By: [name] on 03/11/2023 12:52:21 Tippets, [name] [id number] Page 10 of 17 -------------------------------------------------------------------------------- Problem List Details Patient Name: Date of Service: Gonterman, [name] 03/11/2023 3:00 PM Medical Record Number: [id number] Patient Account Number: [id number] Date of Birth/Sex: Treating RN: 08-30-1945 (77 y.o. 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/11/2022 No Yes bilateral lower extremity I89.0 Lymphedema, not elsewhere classified 03/26/2022 No Yes L97.828 Non-pressure chronic ulcer of other part of left lower leg with other specified 03/26/2022 No Yes severity L97.818 Non-pressure chronic ulcer of other part of right lower leg with other specified 03/26/2022 No Yes severity L84 Corns and callosities 10/15/2022 No Yes Inactive Problems Resolved Problems Electronic Signature(s) Signed: 03/11/2023 2:41:03 PM By: [name] PA-C Entered By: [name] on 03/11/2023  11:41:03 -------------------------------------------------------------------------------- Progress Note Details Patient Name: Date of Service: Azer, [name] 03/11/2023 3:00 PM Medical Record Number: [id number] Patient Account Number: [id number] Date of Birth/Sex: Treating RN: [birth year] (77 y.o. M) Primary Care Provider: [name] Other Clinician: Referring Provider: Treating Provider/Extender: [name] Weeks in Treatment: 3 Subjective Chief Complaint Information obtained from Patient 02/23/2020; patient is here for wounds on his bilateral lower legs in the setting of severe lymphedema 03/26/2022; patient is here for wounds on his bilateral lower legs medial aspect Eckert, Breion ([id number]) [id number] Page 11 of 17 History of Present Illness (HPI) ADMISSION 02/23/2020 Patient is a 77 year old man who lives in [place] who arrives accompanied by his wife. He has a history of chronic lymphedema and venous insufficiency in his bilateral lower legs which may have something to do that with having a history of DVT as well as being treated for prostate cancer. In any case he recently got compression pumps at home but compliance has been an issue here. He has compression stockings however they are probably not sufficient enough to control swelling. They tell us that things deteriorated for him in late August he was admitted to [facility] for 7 days. This was with cellulitis I think of his bilateral lower legs. Discharge he was noted to have wounds on his bilateral lower legs. He was discharged on Bactrim. They tried to get him home health through [facility] of course they declined him. His wife is been wrapping these applying some form of silver foam dressing. He has a history of wounds before although  Leg Wound Laterality: Left, Medial Cleanser: Soap and Water 1 x Per Week/30 Days Discharge Instructions: May shower and wash wound with dial antibacterial soap and water prior to dressing change. Peri-Wound Care: Sween Lotion (Moisturizing lotion) 1 x Per Week/30 Days Discharge Instructions: Apply moisturizing lotion as directed Prim Dressing: Aquacel Ag 1 x Per Week/30 Days ary Discharge Instructions: apply directly to wound bed. Secondary Dressing: ABD Pad, 8x10 1 x Per  Week/30 Days Discharge Instructions: Apply over primary dressing as directed. Secondary Dressing: OptiLock Super Absorbent, 5x5.5 (in/in) (Generic) 1 x Per Week/30 Days Discharge Instructions: Apply directly to wound bed as directed Compression Wrap: Urgo K2, (equivalent to a 4 layer) two layer compression system, regular 1 x Per Week/30 Days Discharge Instructions: Apply Urgo K2 as directed (alternative to 4 layer compression). Electronic Signature(s) Signed: 03/11/2023 4:45:44 PM By: [name] PA-C Signed: 03/11/2023 5:00:37 PM By: [name] RN Entered By: [name] on 03/11/2023 12:52:21 Tippets, [name] [id number] Page 10 of 17 -------------------------------------------------------------------------------- Problem List Details Patient Name: Date of Service: Gonterman, [name] 03/11/2023 3:00 PM Medical Record Number: [id number] Patient Account Number: [id number] Date of Birth/Sex: Treating RN: 08-30-1945 (77 y.o. 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/11/2022 No Yes bilateral lower extremity I89.0 Lymphedema, not elsewhere classified 03/26/2022 No Yes L97.828 Non-pressure chronic ulcer of other part of left lower leg with other specified 03/26/2022 No Yes severity L97.818 Non-pressure chronic ulcer of other part of right lower leg with other specified 03/26/2022 No Yes severity L84 Corns and callosities 10/15/2022 No Yes Inactive Problems Resolved Problems Electronic Signature(s) Signed: 03/11/2023 2:41:03 PM By: [name] PA-C Entered By: [name] on 03/11/2023  11:41:03 -------------------------------------------------------------------------------- Progress Note Details Patient Name: Date of Service: Azer, [name] 03/11/2023 3:00 PM Medical Record Number: [id number] Patient Account Number: [id number] Date of Birth/Sex: Treating RN: [birth year] (77 y.o. M) Primary Care Provider: [name] Other Clinician: Referring Provider: Treating Provider/Extender: [name] Weeks in Treatment: 3 Subjective Chief Complaint Information obtained from Patient 02/23/2020; patient is here for wounds on his bilateral lower legs in the setting of severe lymphedema 03/26/2022; patient is here for wounds on his bilateral lower legs medial aspect Eckert, Breion ([id number]) [id number] Page 11 of 17 History of Present Illness (HPI) ADMISSION 02/23/2020 Patient is a 77 year old man who lives in [place] who arrives accompanied by his wife. He has a history of chronic lymphedema and venous insufficiency in his bilateral lower legs which may have something to do that with having a history of DVT as well as being treated for prostate cancer. In any case he recently got compression pumps at home but compliance has been an issue here. He has compression stockings however they are probably not sufficient enough to control swelling. They tell us that things deteriorated for him in late August he was admitted to [facility] for 7 days. This was with cellulitis I think of his bilateral lower legs. Discharge he was noted to have wounds on his bilateral lower legs. He was discharged on Bactrim. They tried to get him home health through [facility] of course they declined him. His wife is been wrapping these applying some form of silver foam dressing. He has a history of wounds before although  would recommend that based on what we are seeing we go ahead and have the patient continue to monitor for any evidence of infection or worsening. I do believe that he is making excellent headway towards closure although it has been a long time coming I think he is actually doing what he needs to be doing at this point and this is helping keep the swelling under control much more effectively. 2. I am good recommend as well that we continue with the Aquacel Ag followed by the ABD pads to the open areas. We are using OPTi lock and the Urgo K2 compression wrap. 3. I am also can recommend he should continue the lymphedema pumps and elevate his legs this is still of utmost importance. We will see patient back for reevaluation in 1 week here in the clinic. If anything worsens or changes patient will contact our office for additional recommendations. Electronic Signature(s) Signed: 03/11/2023 4:35:02 PM By: [name] PA-C Entered By: [name] on 03/11/2023 13:35:02 -------------------------------------------------------------------------------- SuperBill Details Patient Name: Date of Service: Peregrina, [name] 03/11/2023 Medical Record Number: [id number] Patient Account Number: [id number] Date of Birth/Sex: Treating RN: 07/03/1945 (77 y.o. 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 with other  specified severity L84 Corns and callosities Facility Procedures : CPT4 Code: [id number] Description: 11042 - DEB SUBQ TISSUE 20 SQ CM/< ICD-10 Diagnosis Description L97.828 Non-pressure chronic ulcer of other part of left lower leg with other specified L97.818 Non-pressure chronic ulcer of other part of right lower leg with other specified Modifier: severity severity Quantity: 1 : CPT4 Code: [id number] Description: 11045 - DEB SUBQ TISS EA ADDL 20CM ICD-10 Diagnosis Description L97.828 Non-pressure chronic ulcer of other part of left lower leg with other specified L97.818 Non-pressure chronic ulcer of other part of right lower leg with other specified Modifier: severity severity Quantity: 2 Physician Procedures : CPT4 Code Description Modifier [id number] 11042 - WC PHYS SUBQ TISS 20 SQ CM ICD-10 Diagnosis Description 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 I do believe that he is making excellent headway towards closure although it has been a long time coming I think he is actually doing what he needs to be doing at this point and this is helping keep the swelling under control much more effectively. 2. I am good recommend as well that we continue with the Aquacel Ag followed by the ABD pads to the open areas. We are using OPTi lock and the Urgo K2 compression wrap. 3. I am also can recommend he should continue the lymphedema pumps and elevate his legs this is still of utmost importance. We will see patient back for reevaluation in 1 week here in the clinic. If anything worsens or changes patient will contact our office for additional recommendations. Electronic Signature(s) Signed: 03/11/2023 4:35:02 PM By: [name] PA-C Entered By: [name] on 03/11/2023 13:35:02 -------------------------------------------------------------------------------- SuperBill Details Patient Name: Date of Service: Peregrina, [name] 03/11/2023 Medical Record Number: [id number] Patient Account Number: [id number] Date of Birth/Sex: Treating RN: 07/03/1945 (77 y.o. 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 with other  specified severity L84 Corns and callosities Facility Procedures : CPT4 Code: [id number] Description: 11042 - DEB SUBQ TISSUE 20 SQ CM/< ICD-10 Diagnosis Description L97.828 Non-pressure chronic ulcer of other part of left lower leg with other specified L97.818 Non-pressure chronic ulcer of other part of right lower leg with other specified Modifier: severity severity Quantity: 1 : CPT4 Code: [id number] Description: 11045 - DEB SUBQ TISS EA ADDL 20CM ICD-10 Diagnosis Description L97.828 Non-pressure chronic ulcer of other part of left lower leg with other specified L97.818 Non-pressure chronic ulcer of other part of right lower leg with other specified Modifier: severity severity Quantity: 2 Physician Procedures : CPT4 Code Description Modifier [id number] 11042 - WC PHYS SUBQ TISS 20 SQ CM ICD-10 Diagnosis Description 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 Quantity: 1 : [id number] 11045 - WC PHYS SUBQ TISS EA ADDL 20 CM ICD-10 Diagnosis Description 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 M) Primary Care Provider: [name] Other Clinician: Referring Provider: Treating Provider/Extender: [name] Weeks in Treatment: 23 Information Obtained from: Patient Chief Complaint 02/23/2020; patient is here for wounds on his bilateral lower legs in the setting of severe lymphedema 03/26/2022; patient is here for wounds on his bilateral lower legs medial aspect Electronic Signature(s) Signed: 03/11/2023 2:41:19 PM By: [name] PA-C Entered By: [name] on 03/11/2023 11:41:19 -------------------------------------------------------------------------------- Debridement Details Patient Name: Date of Service: Mance, [name] 03/11/2023 3:00 PM Medical Record Number: [id number] Patient Account Number: [id number] Date of Birth/Sex: Treating RN: March 11, 1946 (77 y.o. [name]: [name] Other Clinician: Referring Provider: Treating Provider/Extender: [name] Weeks in Treatment: 50 Debridement Performed for Assessment: Wound #12 Left,Anterior Lower Leg Performed By: Physician [name], PA The following information was scribed by: [name] The information was scribed for: [name] Debridement Type: Debridement Level of Consciousness (Pre-procedure): Awake and Alert Pre-procedure Verification/Time Out Yes - 15:40 Taken: Start Time: 15:40 Pain Control: Lidocaine 4% T opical Solution Percent of Wound Bed Debrided: 100% T Area Debrided (cm): otal 0.39 Tissue and other material debrided: Viable, Non-Viable, Slough, Subcutaneous, Skin: Dermis ,  Skin: Epidermis, Slough Level: Skin/Subcutaneous Tissue Debridement Description: Excisional Instrument: Curette Bleeding: Minimum Hemostasis Achieved: Pressure Response to Treatment: Procedure was tolerated well Level of Consciousness (Post- Awake and Alert procedure): Post Debridement Measurements of Total Wound Length: (cm) 1 Width: (cm) 0.5 Depth: (cm) 0.1 Volume: (cm) 0.039 Character of Wound/Ulcer Post Debridement: Improved Fuller, Deunte ([id number]) [id number] Page 2 of 17 Post Procedure Diagnosis Same as Pre-procedure Electronic Signature(s) Signed: 03/11/2023 4:45:44 PM By: [name] PA-C Signed: 03/11/2023 5:00:37 PM By: [name] RN Entered By: [name] on 03/11/2023 12:59:24 -------------------------------------------------------------------------------- Debridement Details Patient Name: Date of Service: Raney, [name] 03/11/2023 3:00 PM Medical Record Number: [id number] Patient Account Number: [id number] Date of Birth/Sex: Treating RN: [birth year] (77 y.o. [name]: [name] Other Clinician: Referring Provider: Treating Provider/Extender: [name] Weeks in Treatment: 50 Debridement Performed for Assessment: Wound #6 Right,Medial Lower Leg Performed By: Physician [name], PA The following information was scribed by: [name] The information was scribed for: [name] Debridement Type: Debridement Level of Consciousness (Pre-procedure): Awake and Alert Pre-procedure Verification/Time Out Yes - 15:40 Taken: Start Time: 15:40 Pain Control: Lidocaine 4% T opical Solution Percent of Wound Bed Debrided: 100% T Area Debrided (cm): otal 28.26 Tissue and other material debrided: Viable, Non-Viable, Slough, Subcutaneous, Skin: Dermis , Skin: Epidermis, Slough Level: Skin/Subcutaneous Tissue Debridement Description: Excisional Instrument:  Curette Bleeding: Minimum Hemostasis Achieved: Pressure Response to Treatment: Procedure was tolerated well Level of Consciousness (Post- Awake and Alert procedure): Post Debridement Measurements of Total Wound Length: (cm) 8 Width: (cm) 4.5 Depth: (cm) 0.4 Volume: (cm) 11.31 Character of Wound/Ulcer Post Debridement: Improved Post Procedure Diagnosis Same as Pre-procedure Electronic Signature(s) Signed: 03/11/2023 4:45:44 PM By: [name] PA-C Signed: 03/11/2023 5:00:37 PM By: [name] RN Entered By: [name] on 03/11/2023 12:59:36 -------------------------------------------------------------------------------- Debridement Details Patient Name: Date of Service: Nealis, [name] 03/11/2023 3:00 PM Medical Record Number: [id number] Patient Account Number: [id number] [place],

## 2023-03-11 NOTE — Progress Notes (Signed)
Care Vidit Boissonneault: [name] Other Clinician: Referring Jordin Dambrosio: Treating Trysten Bernard/Extender: [name] Weeks in Treatment: 55 Active Problems Location of Pain Severity and Description of Pain Patient Has Paino No Site Locations Pain Management and Medication Current Pain Management: Electronic Signature(s) Signed: 03/11/2023 8:33:23 AM By: Karl Ito Entered By: Karl Ito on 03/04/2023 15:29:34 -------------------------------------------------------------------------------- Patient/Caregiver Education Details Patient Name: Date of Service: [name], [name] 10/16/2024andnbsp3:00 PM Medical Record Number: [id number] Patient Account Number: [id number] Date of Birth/Gender: Treating RN: 04/26/1946 (77 y.o. [name] Primary Care Physician: [name] Other Clinician: Referring Physician: Treating Physician/Extender: [name] in Treatment: 85 Education Assessment Education Provided To: Patient Education Topics Provided Wound/Skin Impairment: Methods: Explain/Verbal Responses: State content correctly [name], [name] ([id number]) [phone].pdf Page 6 of 12 Electronic Signature(s) Signed: 03/04/2023 5:01:16 PM By: [name] RN Entered By: [name] on 03/04/2023 16:27:33 -------------------------------------------------------------------------------- Wound Assessment Details Patient Name: Date of Service: [name], [name] 03/04/2023 3:00 PM Medical Record Number: [id number] Patient Account Number: [id number] Date of Birth/Sex: Treating RN: [birth year] (77 y.o. M) Primary Care Reece Fehnel: [name] Other Clinician: Referring Aella Ronda: Treating Phoenyx Melka/Extender: Leveda Anna, [name] Weeks in Treatment: 50 Wound Status Wound Number: 12 Primary Lymphedema Etiology: Wound Location: Left, Anterior Lower Leg Wound Open Wounding Event: Frostbite Status: Date Acquired: 12/17/2022 Comorbid Anemia, Lymphedema, Deep Vein Thrombosis, Hypertension, Weeks Of Treatment: 11 History:  Received Radiation Clustered Wound: No Photos Wound Measurements Length: (cm) 1 Width: (cm) 0.5 Depth: (cm) 0.1 Area: (cm) 0.393 Volume: (cm) 0.039 % Reduction in Area: 88.9% % Reduction in Volume: 94.5% Epithelialization: Small (1-33%) Tunneling: No Undermining: No Wound Description Classification: Full Thickness Without Exposed Suppor Wound Margin: Distinct, outline attached Exudate Amount: Large Exudate Type: Serous Exudate Color: amber t Structures Foul Odor After Cleansing: No Slough/Fibrino Yes Wound Bed Granulation Amount: Large (67-100%) Exposed Structure Granulation Quality: Red, Pink Fascia Exposed: No Necrotic Amount: Small (1-33%) Fat Layer (Subcutaneous Tissue) Exposed: Yes Necrotic Quality: Adherent Slough Tendon Exposed: No Muscle Exposed: No Joint Exposed: No Bone Exposed: No Periwound Skin Texture Texture Color No Abnormalities Noted: No No Abnormalities Noted: No Callus: No Atrophie Blanche: No Crepitus: No Cyanosis: No Tortorelli, Jobe ([id number]) [id number] Page 7 of 12 Excoriation: No Ecchymosis: No Induration: No Erythema: No Rash: No Hemosiderin Staining: Yes Scarring: No Mottled: No Pallor: No Moisture Rubor: No No Abnormalities Noted: No Dry / Scaly: No Temperature / Pain Maceration: No Temperature: No Abnormality Treatment Notes Wound #12 (Lower Leg) Wound Laterality: Left, Anterior Cleanser Soap and Water Discharge Instruction: May shower and wash wound with dial antibacterial soap and water prior to dressing change. Peri-Wound Care Sween Lotion (Moisturizing lotion) Discharge Instruction: Apply moisturizing lotion as directed Topical Primary Dressing Aquacel Ag Discharge Instruction: apply directly to wound bed. Secondary Dressing ABD Pad, 8x10 Discharge Instruction: Apply over primary dressing as directed. OptiLock Super Absorbent, 5x5.5 (in/in) Discharge Instruction: Apply directly to  wound bed as directed Secured With Compression Wrap Urgo K2, (equivalent to a 4 layer) two layer compression system, regular Discharge Instruction: Apply Urgo K2 as directed (alternative to 4 layer compression). Compression Stockings Add-Ons Electronic Signature(s) Signed: 03/04/2023 4:39:33 PM By: Thayer Dallas Entered By: Thayer Dallas on 03/04/2023 15:45:01 -------------------------------------------------------------------------------- Wound Assessment Details Patient Name: Date of Service: [name], [name] 03/04/2023 3:00 PM Medical Record Number: [id number] Patient Account Number: [id number] Date of Birth/Sex: Treating RN: 24-Sep-1945 (77 y.o. M) Primary Care Charlaine Utsey: [name] Other Clinician: Referring  Performed By: Clinician [name], RN Compression Type: Four Layer Post Procedure Diagnosis Same as Pre-procedure Notes Or Harlene Salts Electronic Signature(s) Signed: 03/04/2023 5:01:16 PM By: [name] RN Entered By: [name] on 03/04/2023 16:04:07 -------------------------------------------------------------------------------- Encounter Discharge Information Details Patient Name: Date of Service: [name], [name] 03/04/2023 3:00 PM Medical Record Number: [id number] Patient Account Number: [id number] Date of Birth/Sex: Treating RN: February 15, 1946 (77 y.o. [name] Primary Care Lorenda Grecco: [name] Other Clinician: Referring Levaeh Vice: Treating Jenese Mischke/Extender: [name] Weeks in Treatment: 40 Encounter Discharge Information Items Post Procedure Vitals Discharge Condition: Stable Temperature (F): 98.5 Ambulatory Status: Wheelchair Pulse (bpm): 82 Discharge Destination: Home Respiratory Rate (breaths/min): 18 Transportation: Private Auto Blood Pressure (mmHg): 167/67 Accompanied By: self Schedule  Follow-up Appointment: Yes Clinical Summary of Care: Patient Declined Electronic Signature(s) Signed: 03/04/2023 5:01:16 PM By: [name] RN Entered By: [name] on 03/04/2023 16:38:39 -------------------------------------------------------------------------------- Lower Extremity Assessment Details Patient Name: Date of Service: [name], [name] 03/04/2023 3:00 PM Medical Record Number: [id number] Patient Account Number: [id number] Date of Birth/Sex: Treating RN: 16-Jun-1945 (77 y.o. M) Primary Care Arika Mainer: [name] Other Clinician: Referring Rebecah Dangerfield: Treating Amairani Shuey/Extender: Leveda Anna, [name] Weeks in Treatment: 49 Edema Assessment Assessed: [Left: No] [Right: No] Edema: [Left: Yes] [Right: Yes] Calf Left: Right: Point of Measurement: 40 cm From Medial Instep 55.5 cm 62.5 cm Ankle [name], [name] ([id number]) [id number] Page 4 of 12 Left: Right: Point of Measurement: 13 cm From Medial Instep 35 cm 36.6 cm Vascular Assessment Extremity colors, hair growth, and conditions: Extremity Color: [Left:Hyperpigmented] [Right:Hyperpigmented] Hair Growth on Extremity: [Left:No] Temperature of Extremity: [Left:Warm] [Right:Warm] Capillary Refill: [Left:> 3 seconds] [Right:> 3 seconds] Dependent Rubor: [Left:No Yes] [Right:No Yes] Electronic Signature(s) Signed: 03/04/2023 4:39:33 PM By: Thayer Dallas Entered By: Thayer Dallas on 03/04/2023 15:44:49 -------------------------------------------------------------------------------- Multi-Disciplinary Care Plan Details Patient Name: Date of Service: [name], [name] 03/04/2023 3:00 PM Medical Record Number: [id number] Patient Account Number: [id number] Date of Birth/Sex: Treating RN: June 25, 1945 (77 y.o. [name] Primary Care Mallissa Lorenzen: [name] Other Clinician: Referring Caliah Kopke: Treating Shelisa Fern/Extender: [name] Weeks in  Treatment: 41 Multidisciplinary Care Plan reviewed with physician Active Inactive Wound/Skin Impairment Nursing Diagnoses: Impaired tissue integrity Knowledge deficit related to ulceration/compromised skin integrity Goals: Patient will have a decrease in wound volume by X% from date: (specify in notes) Date Initiated: 03/26/2022 Target Resolution Date: 04/17/2025 Goal Status: Active Patient/caregiver will verbalize understanding of skin care regimen Date Initiated: 03/26/2022 Target Resolution Date: 04/17/2025 Goal Status: Active Ulcer/skin breakdown will have a volume reduction of 30% by week 4 Date Initiated: 03/26/2022 Date Inactivated: 05/21/2022 Target Resolution Date: 05/17/2022 Unmet Reason: see wound Goal Status: Unmet measurement. Ulcer/skin breakdown will have a volume reduction of 50% by week 8 Date Initiated: 03/26/2022 Date Inactivated: 05/21/2022 Target Resolution Date: 05/17/2022 Unmet Reason: see wound Goal Status: Unmet measurement. Interventions: Assess patient/caregiver ability to obtain necessary supplies Assess patient/caregiver ability to perform ulcer/skin care regimen upon admission and as needed Assess ulceration(s) every visit Notes: Patient stated today, "I will take my fluid pill or pump not do both." [name] made aware. Electronic Signature(s) Signed: 03/04/2023 5:01:16 PM By: [name] RN Entered By: [name] on 03/04/2023 16:23:41 [name], [name] ([id number]) [id number] Page 5 of 12 -------------------------------------------------------------------------------- Pain Assessment Details Patient Name: Date of Service: [name], [name] 03/04/2023 3:00 PM Medical Record Number: [id number] Patient Account Number: [id number] Date of Birth/Sex: Treating RN: [birth year] (77 y.o. M) Primary  Care Vidit Boissonneault: [name] Other Clinician: Referring Jordin Dambrosio: Treating Trysten Bernard/Extender: [name] Weeks in Treatment: 55 Active Problems Location of Pain Severity and Description of Pain Patient Has Paino No Site Locations Pain Management and Medication Current Pain Management: Electronic Signature(s) Signed: 03/11/2023 8:33:23 AM By: Karl Ito Entered By: Karl Ito on 03/04/2023 15:29:34 -------------------------------------------------------------------------------- Patient/Caregiver Education Details Patient Name: Date of Service: [name], [name] 10/16/2024andnbsp3:00 PM Medical Record Number: [id number] Patient Account Number: [id number] Date of Birth/Gender: Treating RN: 04/26/1946 (77 y.o. [name] Primary Care Physician: [name] Other Clinician: Referring Physician: Treating Physician/Extender: [name] in Treatment: 85 Education Assessment Education Provided To: Patient Education Topics Provided Wound/Skin Impairment: Methods: Explain/Verbal Responses: State content correctly [name], [name] ([id number]) [phone].pdf Page 6 of 12 Electronic Signature(s) Signed: 03/04/2023 5:01:16 PM By: [name] RN Entered By: [name] on 03/04/2023 16:27:33 -------------------------------------------------------------------------------- Wound Assessment Details Patient Name: Date of Service: [name], [name] 03/04/2023 3:00 PM Medical Record Number: [id number] Patient Account Number: [id number] Date of Birth/Sex: Treating RN: [birth year] (77 y.o. M) Primary Care Reece Fehnel: [name] Other Clinician: Referring Aella Ronda: Treating Phoenyx Melka/Extender: Leveda Anna, [name] Weeks in Treatment: 50 Wound Status Wound Number: 12 Primary Lymphedema Etiology: Wound Location: Left, Anterior Lower Leg Wound Open Wounding Event: Frostbite Status: Date Acquired: 12/17/2022 Comorbid Anemia, Lymphedema, Deep Vein Thrombosis, Hypertension, Weeks Of Treatment: 11 History:  Received Radiation Clustered Wound: No Photos Wound Measurements Length: (cm) 1 Width: (cm) 0.5 Depth: (cm) 0.1 Area: (cm) 0.393 Volume: (cm) 0.039 % Reduction in Area: 88.9% % Reduction in Volume: 94.5% Epithelialization: Small (1-33%) Tunneling: No Undermining: No Wound Description Classification: Full Thickness Without Exposed Suppor Wound Margin: Distinct, outline attached Exudate Amount: Large Exudate Type: Serous Exudate Color: amber t Structures Foul Odor After Cleansing: No Slough/Fibrino Yes Wound Bed Granulation Amount: Large (67-100%) Exposed Structure Granulation Quality: Red, Pink Fascia Exposed: No Necrotic Amount: Small (1-33%) Fat Layer (Subcutaneous Tissue) Exposed: Yes Necrotic Quality: Adherent Slough Tendon Exposed: No Muscle Exposed: No Joint Exposed: No Bone Exposed: No Periwound Skin Texture Texture Color No Abnormalities Noted: No No Abnormalities Noted: No Callus: No Atrophie Blanche: No Crepitus: No Cyanosis: No Tortorelli, Jobe ([id number]) [id number] Page 7 of 12 Excoriation: No Ecchymosis: No Induration: No Erythema: No Rash: No Hemosiderin Staining: Yes Scarring: No Mottled: No Pallor: No Moisture Rubor: No No Abnormalities Noted: No Dry / Scaly: No Temperature / Pain Maceration: No Temperature: No Abnormality Treatment Notes Wound #12 (Lower Leg) Wound Laterality: Left, Anterior Cleanser Soap and Water Discharge Instruction: May shower and wash wound with dial antibacterial soap and water prior to dressing change. Peri-Wound Care Sween Lotion (Moisturizing lotion) Discharge Instruction: Apply moisturizing lotion as directed Topical Primary Dressing Aquacel Ag Discharge Instruction: apply directly to wound bed. Secondary Dressing ABD Pad, 8x10 Discharge Instruction: Apply over primary dressing as directed. OptiLock Super Absorbent, 5x5.5 (in/in) Discharge Instruction: Apply directly to  wound bed as directed Secured With Compression Wrap Urgo K2, (equivalent to a 4 layer) two layer compression system, regular Discharge Instruction: Apply Urgo K2 as directed (alternative to 4 layer compression). Compression Stockings Add-Ons Electronic Signature(s) Signed: 03/04/2023 4:39:33 PM By: Thayer Dallas Entered By: Thayer Dallas on 03/04/2023 15:45:01 -------------------------------------------------------------------------------- Wound Assessment Details Patient Name: Date of Service: [name], [name] 03/04/2023 3:00 PM Medical Record Number: [id number] Patient Account Number: [id number] Date of Birth/Sex: Treating RN: 24-Sep-1945 (77 y.o. M) Primary Care Charlaine Utsey: [name] Other Clinician: Referring  Care Vidit Boissonneault: [name] Other Clinician: Referring Jordin Dambrosio: Treating Trysten Bernard/Extender: [name] Weeks in Treatment: 55 Active Problems Location of Pain Severity and Description of Pain Patient Has Paino No Site Locations Pain Management and Medication Current Pain Management: Electronic Signature(s) Signed: 03/11/2023 8:33:23 AM By: Karl Ito Entered By: Karl Ito on 03/04/2023 15:29:34 -------------------------------------------------------------------------------- Patient/Caregiver Education Details Patient Name: Date of Service: [name], [name] 10/16/2024andnbsp3:00 PM Medical Record Number: [id number] Patient Account Number: [id number] Date of Birth/Gender: Treating RN: 04/26/1946 (77 y.o. [name] Primary Care Physician: [name] Other Clinician: Referring Physician: Treating Physician/Extender: [name] in Treatment: 85 Education Assessment Education Provided To: Patient Education Topics Provided Wound/Skin Impairment: Methods: Explain/Verbal Responses: State content correctly [name], [name] ([id number]) [phone].pdf Page 6 of 12 Electronic Signature(s) Signed: 03/04/2023 5:01:16 PM By: [name] RN Entered By: [name] on 03/04/2023 16:27:33 -------------------------------------------------------------------------------- Wound Assessment Details Patient Name: Date of Service: [name], [name] 03/04/2023 3:00 PM Medical Record Number: [id number] Patient Account Number: [id number] Date of Birth/Sex: Treating RN: [birth year] (77 y.o. M) Primary Care Reece Fehnel: [name] Other Clinician: Referring Aella Ronda: Treating Phoenyx Melka/Extender: Leveda Anna, [name] Weeks in Treatment: 50 Wound Status Wound Number: 12 Primary Lymphedema Etiology: Wound Location: Left, Anterior Lower Leg Wound Open Wounding Event: Frostbite Status: Date Acquired: 12/17/2022 Comorbid Anemia, Lymphedema, Deep Vein Thrombosis, Hypertension, Weeks Of Treatment: 11 History:  Received Radiation Clustered Wound: No Photos Wound Measurements Length: (cm) 1 Width: (cm) 0.5 Depth: (cm) 0.1 Area: (cm) 0.393 Volume: (cm) 0.039 % Reduction in Area: 88.9% % Reduction in Volume: 94.5% Epithelialization: Small (1-33%) Tunneling: No Undermining: No Wound Description Classification: Full Thickness Without Exposed Suppor Wound Margin: Distinct, outline attached Exudate Amount: Large Exudate Type: Serous Exudate Color: amber t Structures Foul Odor After Cleansing: No Slough/Fibrino Yes Wound Bed Granulation Amount: Large (67-100%) Exposed Structure Granulation Quality: Red, Pink Fascia Exposed: No Necrotic Amount: Small (1-33%) Fat Layer (Subcutaneous Tissue) Exposed: Yes Necrotic Quality: Adherent Slough Tendon Exposed: No Muscle Exposed: No Joint Exposed: No Bone Exposed: No Periwound Skin Texture Texture Color No Abnormalities Noted: No No Abnormalities Noted: No Callus: No Atrophie Blanche: No Crepitus: No Cyanosis: No Tortorelli, Jobe ([id number]) [id number] Page 7 of 12 Excoriation: No Ecchymosis: No Induration: No Erythema: No Rash: No Hemosiderin Staining: Yes Scarring: No Mottled: No Pallor: No Moisture Rubor: No No Abnormalities Noted: No Dry / Scaly: No Temperature / Pain Maceration: No Temperature: No Abnormality Treatment Notes Wound #12 (Lower Leg) Wound Laterality: Left, Anterior Cleanser Soap and Water Discharge Instruction: May shower and wash wound with dial antibacterial soap and water prior to dressing change. Peri-Wound Care Sween Lotion (Moisturizing lotion) Discharge Instruction: Apply moisturizing lotion as directed Topical Primary Dressing Aquacel Ag Discharge Instruction: apply directly to wound bed. Secondary Dressing ABD Pad, 8x10 Discharge Instruction: Apply over primary dressing as directed. OptiLock Super Absorbent, 5x5.5 (in/in) Discharge Instruction: Apply directly to  wound bed as directed Secured With Compression Wrap Urgo K2, (equivalent to a 4 layer) two layer compression system, regular Discharge Instruction: Apply Urgo K2 as directed (alternative to 4 layer compression). Compression Stockings Add-Ons Electronic Signature(s) Signed: 03/04/2023 4:39:33 PM By: Thayer Dallas Entered By: Thayer Dallas on 03/04/2023 15:45:01 -------------------------------------------------------------------------------- Wound Assessment Details Patient Name: Date of Service: [name], [name] 03/04/2023 3:00 PM Medical Record Number: [id number] Patient Account Number: [id number] Date of Birth/Sex: Treating RN: 24-Sep-1945 (77 y.o. M) Primary Care Charlaine Utsey: [name] Other Clinician: Referring  Performed By: Clinician [name], RN Compression Type: Four Layer Post Procedure Diagnosis Same as Pre-procedure Notes Or Harlene Salts Electronic Signature(s) Signed: 03/04/2023 5:01:16 PM By: [name] RN Entered By: [name] on 03/04/2023 16:04:07 -------------------------------------------------------------------------------- Encounter Discharge Information Details Patient Name: Date of Service: [name], [name] 03/04/2023 3:00 PM Medical Record Number: [id number] Patient Account Number: [id number] Date of Birth/Sex: Treating RN: February 15, 1946 (77 y.o. [name] Primary Care Lorenda Grecco: [name] Other Clinician: Referring Levaeh Vice: Treating Jenese Mischke/Extender: [name] Weeks in Treatment: 40 Encounter Discharge Information Items Post Procedure Vitals Discharge Condition: Stable Temperature (F): 98.5 Ambulatory Status: Wheelchair Pulse (bpm): 82 Discharge Destination: Home Respiratory Rate (breaths/min): 18 Transportation: Private Auto Blood Pressure (mmHg): 167/67 Accompanied By: self Schedule  Follow-up Appointment: Yes Clinical Summary of Care: Patient Declined Electronic Signature(s) Signed: 03/04/2023 5:01:16 PM By: [name] RN Entered By: [name] on 03/04/2023 16:38:39 -------------------------------------------------------------------------------- Lower Extremity Assessment Details Patient Name: Date of Service: [name], [name] 03/04/2023 3:00 PM Medical Record Number: [id number] Patient Account Number: [id number] Date of Birth/Sex: Treating RN: 16-Jun-1945 (77 y.o. M) Primary Care Arika Mainer: [name] Other Clinician: Referring Rebecah Dangerfield: Treating Amairani Shuey/Extender: Leveda Anna, [name] Weeks in Treatment: 49 Edema Assessment Assessed: [Left: No] [Right: No] Edema: [Left: Yes] [Right: Yes] Calf Left: Right: Point of Measurement: 40 cm From Medial Instep 55.5 cm 62.5 cm Ankle [name], [name] ([id number]) [id number] Page 4 of 12 Left: Right: Point of Measurement: 13 cm From Medial Instep 35 cm 36.6 cm Vascular Assessment Extremity colors, hair growth, and conditions: Extremity Color: [Left:Hyperpigmented] [Right:Hyperpigmented] Hair Growth on Extremity: [Left:No] Temperature of Extremity: [Left:Warm] [Right:Warm] Capillary Refill: [Left:> 3 seconds] [Right:> 3 seconds] Dependent Rubor: [Left:No Yes] [Right:No Yes] Electronic Signature(s) Signed: 03/04/2023 4:39:33 PM By: Thayer Dallas Entered By: Thayer Dallas on 03/04/2023 15:44:49 -------------------------------------------------------------------------------- Multi-Disciplinary Care Plan Details Patient Name: Date of Service: [name], [name] 03/04/2023 3:00 PM Medical Record Number: [id number] Patient Account Number: [id number] Date of Birth/Sex: Treating RN: June 25, 1945 (77 y.o. [name] Primary Care Mallissa Lorenzen: [name] Other Clinician: Referring Caliah Kopke: Treating Shelisa Fern/Extender: [name] Weeks in  Treatment: 41 Multidisciplinary Care Plan reviewed with physician Active Inactive Wound/Skin Impairment Nursing Diagnoses: Impaired tissue integrity Knowledge deficit related to ulceration/compromised skin integrity Goals: Patient will have a decrease in wound volume by X% from date: (specify in notes) Date Initiated: 03/26/2022 Target Resolution Date: 04/17/2025 Goal Status: Active Patient/caregiver will verbalize understanding of skin care regimen Date Initiated: 03/26/2022 Target Resolution Date: 04/17/2025 Goal Status: Active Ulcer/skin breakdown will have a volume reduction of 30% by week 4 Date Initiated: 03/26/2022 Date Inactivated: 05/21/2022 Target Resolution Date: 05/17/2022 Unmet Reason: see wound Goal Status: Unmet measurement. Ulcer/skin breakdown will have a volume reduction of 50% by week 8 Date Initiated: 03/26/2022 Date Inactivated: 05/21/2022 Target Resolution Date: 05/17/2022 Unmet Reason: see wound Goal Status: Unmet measurement. Interventions: Assess patient/caregiver ability to obtain necessary supplies Assess patient/caregiver ability to perform ulcer/skin care regimen upon admission and as needed Assess ulceration(s) every visit Notes: Patient stated today, "I will take my fluid pill or pump not do both." [name] made aware. Electronic Signature(s) Signed: 03/04/2023 5:01:16 PM By: [name] RN Entered By: [name] on 03/04/2023 16:23:41 [name], [name] ([id number]) [id number] Page 5 of 12 -------------------------------------------------------------------------------- Pain Assessment Details Patient Name: Date of Service: [name], [name] 03/04/2023 3:00 PM Medical Record Number: [id number] Patient Account Number: [id number] Date of Birth/Sex: Treating RN: [birth year] (77 y.o. M) Primary  No Rash: No Hemosiderin Staining: Yes Scarring: Yes Mottled: No Pallor: No Moisture Rubor: No No Abnormalities Noted: No Dry / Scaly: No Temperature / Pain Maceration: No Temperature: No Abnormality Treatment Notes Wound #6 (Lower Leg) Wound Laterality: Right, Medial Cleanser Soap and Water Discharge Instruction: May shower and wash wound with dial antibacterial soap and water prior to dressing change. Peri-Wound Care Sween Lotion (Moisturizing lotion) Discharge  Instruction: Apply moisturizing lotion as directed Topical Primary Dressing Aquacel Ag Discharge Instruction: apply directly to wound bed. Secondary Dressing ABD Pad, 8x10 Discharge Instruction: Apply over primary dressing as directed. OptiLock Super Absorbent, 5x5.5 (in/in) Discharge Instruction: Apply directly to wound bed as directed Secured With Compression Wrap Urgo K2, (equivalent to a 4 layer) two layer compression system, regular Discharge Instruction: Apply Urgo K2 as directed (alternative to 4 layer compression). Compression Stockings Add-Ons Electronic Signature(s) Signed: 03/04/2023 4:39:33 PM By: Thayer Dallas Entered By: Thayer Dallas on 03/04/2023 15:45:26 -------------------------------------------------------------------------------- Wound Assessment Details Patient Name: Date of Service: [name], [name] 03/04/2023 3:00 PM Medical Record Number: [id number] Patient Account Number: [id number] Date of Birth/Sex: Treating RN: 13-Jan-1946 (77 y.o. M) Primary Care Ryken Paschal: [name] Other Clinician: Referring Makaila Windle: Treating Charan Prieto/Extender: Leveda Anna, [name] Weeks in Treatment: 88 Wound Status Wound Number: 7 Primary Lymphedema Etiology: Wound Location: Left, Medial Lower Leg [name], [name] ([id number]) [id number] Page 11 of 12 Wound Open Wounding Event: Gradually Appeared Status: Date Acquired: 03/05/2022 Comorbid Anemia, Lymphedema, Deep Vein Thrombosis, Hypertension, Weeks Of Treatment: 49 History: Received Radiation Clustered Wound: Yes Photos Wound Measurements Length: (cm) Width: (cm) Depth: (cm) Clustered Quantity: Area: (cm) Volume: (cm) 3 % Reduction in Area: 79% 4.2 % Reduction in Volume: 58% 0.2 Epithelialization: Small (1-33%) 1 Tunneling: No 9.896 Undermining: No 1.979 Wound Description Classification: Full Thickness Without Exposed Sup Wound Margin: Distinct, outline  attached Exudate Amount: Large Exudate Type: Serous Exudate Color: amber port Structures Foul Odor After Cleansing: No Slough/Fibrino Yes Wound Bed Granulation Amount: Small (1-33%) Exposed Structure Granulation Quality: Red Fascia Exposed: No Necrotic Amount: Large (67-100%) Fat Layer (Subcutaneous Tissue) Exposed: Yes Necrotic Quality: Adherent Slough Tendon Exposed: No Muscle Exposed: No Joint Exposed: No Bone Exposed: No Periwound Skin Texture Texture Color No Abnormalities Noted: No No Abnormalities Noted: No Callus: No Atrophie Blanche: No Crepitus: No Cyanosis: No Excoriation: No Ecchymosis: No Induration: No Erythema: No Rash: No Hemosiderin Staining: Yes Scarring: Yes Mottled: No Pallor: No Moisture Rubor: No No Abnormalities Noted: No Dry / Scaly: No Temperature / Pain Maceration: No Temperature: No Abnormality Treatment Notes Wound #7 (Lower Leg) Wound Laterality: Left, Medial Cleanser Soap and Water Discharge Instruction: May shower and wash wound with dial antibacterial soap and water prior to dressing change. Peri-Wound Care Sween Lotion (Moisturizing lotion) Discharge Instruction: Apply moisturizing lotion as directed Topical Primary Dressing Aquacel Ag Discharge Instruction: apply directly to wound bed. Marquess, [name] ([id number]) [id number] Page 12 of 12 Secondary Dressing ABD Pad, 8x10 Discharge Instruction: Apply over primary dressing as directed. OptiLock Super Absorbent, 5x5.5 (in/in) Discharge Instruction: Apply directly to wound bed as directed Secured With Compression Wrap Urgo K2, (equivalent to a 4 layer) two layer compression system, regular Discharge Instruction: Apply Urgo K2 as directed (alternative to 4 layer compression). Compression Stockings Add-Ons Electronic Signature(s) Signed: 03/04/2023 4:39:33 PM By: Thayer Dallas Entered By: Thayer Dallas on 03/04/2023  15:45:40 -------------------------------------------------------------------------------- Vitals Details Patient Name: Date of Service: [name], [name] 03/04/2023 3:00 PM Medical Record Number: [id number] Patient Account Number: [id number] Date of Birth/Sex: Treating

## 2023-03-13 NOTE — Progress Notes (Signed)
PM By: [name] RN [id number] Page 3 of 12 Signed: 03/11/2023 5:00:37 Entered By: [name] on 03/11/2023 16:04:22 -------------------------------------------------------------------------------- Encounter Discharge Information Details Patient Name: Date of Service: [name] 03/11/2023 3:00 PM Medical Record Number: [id number] Patient Account Number: [id number] Date of Birth/Sex: Treating RN: 1945-06-18 (77 y.o. [name] Primary Care Hazleigh Mccleave: [name] Other Clinician: Referring Anastashia Westerfeld: Treating Antwan Pandya/Extender: [name] Weeks in Treatment: 61 Encounter Discharge Information Items Post Procedure Vitals Discharge Condition: Stable Temperature (F): 98 Ambulatory Status: Cane Pulse (bpm): 80 Discharge Destination: Home Respiratory Rate (breaths/min): 18 Transportation: Private Auto Blood Pressure (mmHg): 169/65 Accompanied By: self Schedule Follow-up Appointment: No Clinical Summary of Care: Patient Declined Electronic Signature(s) Signed: 03/11/2023 5:00:37 PM  By: [name] RN Entered By: [name] on 03/11/2023 16:57:43 -------------------------------------------------------------------------------- Lower Extremity Assessment Details Patient Name: Date of Service: [name] 03/11/2023 3:00 PM Medical Record Number: [id number] Patient Account Number: [id number] Date of Birth/Sex: Treating RN: [birth year] (77 y.o. [name] Primary Care Masie Bermingham: [name] Other Clinician: Referring Tammela Bales: Treating Toa Mia/Extender: [name] Weeks in Treatment: 50 Edema Assessment Assessed: [Left: No] [Right: No] Edema: [Left: Yes] [Right: Yes] Calf Left: Right: Point of Measurement: 40 cm From Medial Instep 57 cm 60 cm Ankle Left: Right: Point of Measurement: 13 cm From Medial Instep 35.2 cm 36.1 cm Vascular Assessment Pulses: Dorsalis Pedis Palpable: [Left:Yes] [Right:Yes] Extremity colors, hair growth, and conditions: Extremity Color: [Left:Hyperpigmented] [Right:Hyperpigmented] Hair Growth on Extremity: [Left:No] Temperature of Extremity: [Left:Warm] [Right:Warm] Capillary Refill: [Left:> 3 seconds] [Right:> 3 seconds] Dependent Rubor: [Left:No] [Right:No] Custis, Polk ([id number]) [Left:Yes] [Right:[id number] Page 4 of 12 Yes] Electronic Signature(s) Signed: 03/11/2023 5:00:37 PM By: [name] RN Entered By: [name] on 03/11/2023 15:44:56 -------------------------------------------------------------------------------- Multi-Disciplinary Care Plan Details Patient Name: Date of Service: [name] 03/11/2023 3:00 PM Medical Record Number: [id number] Patient Account Number: [id number] Date of Birth/Sex: Treating RN: 1945-11-16 (77 y.o. [name] Primary Care Zamorah Ailes: [name] Other Clinician: Referring Nneka Blanda: Treating Shiva Karis/Extender: [name] Weeks in Treatment: 32 Multidisciplinary Care  Plan reviewed with physician Active Inactive Wound/Skin Impairment Nursing Diagnoses: Impaired tissue integrity Knowledge deficit related to ulceration/compromised skin integrity Goals: Patient will have a decrease in wound volume by X% from date: (specify in notes) Date Initiated: 03/26/2022 Target Resolution Date: 04/17/2025 Goal Status: Active Patient/caregiver will verbalize understanding of skin care regimen Date Initiated: 03/26/2022 Target Resolution Date: 04/17/2025 Goal Status: Active Ulcer/skin breakdown will have a volume reduction of 30% by week 4 Date Initiated: 03/26/2022 Date Inactivated: 05/21/2022 Target Resolution Date: 05/17/2022 Unmet Reason: see wound Goal Status: Unmet measurement. Ulcer/skin breakdown will have a volume reduction of 50% by week 8 Date Initiated: 03/26/2022 Date Inactivated: 05/21/2022 Target Resolution Date: 05/17/2022 Unmet Reason: see wound Goal Status: Unmet measurement. Interventions: Assess patient/caregiver ability to obtain necessary supplies Assess patient/caregiver ability to perform ulcer/skin care regimen upon admission and as needed Assess ulceration(s) every visit Notes: Patient stated today, "I will take my fluid pill or pump not do both." Lynk Marti made aware. Electronic Signature(s) Signed: 03/11/2023 5:00:37 PM By: [name] RN Entered By: [name] on 03/11/2023 16:54:44 -------------------------------------------------------------------------------- Pain Assessment Details Patient Name: Date of Service: [name] 03/11/2023 3:00 PM Medical Record Number: [id number] Patient Account Number: [id number] [name] ([id number]) [phone].pdf Page 5 of 12 Date of Birth/Sex: Treating RN: [birth year] (77 y.o. M) Primary Care Versie Soave: Other Clinician: [name] Referring  Muslima Toppins: Treating Karalyne Nusser/Extender: [name] Weeks in Treatment: 37 Active  Problems Location of Pain Severity and Description of Pain Patient Has Paino No Site Locations Pain Management and Medication Current Pain Management: Electronic Signature(s) Signed: 03/13/2023 10:42:43 AM By: Karl Ito Entered By: Karl Ito on 03/11/2023 15:27:53 -------------------------------------------------------------------------------- Patient/Caregiver Education Details Patient Name: Date of Service: [name] 10/23/2024andnbsp3:00 PM Medical Record Number: [id number] Patient Account Number: [id number] Date of Birth/Gender: Treating RN: [birth year] (77 y.o. [name] Primary Care Physician: [name] Other Clinician: Referring Physician: Treating Physician/Extender: [name] in Treatment: 24 Education Assessment Education Provided To: Patient Education Topics Provided Wound/Skin Impairment: Methods: Explain/Verbal Responses: State content correctly Electronic Signature(s) Signed: 03/11/2023 5:00:37 PM By: [name] RN Entered By: [name] on 03/11/2023 16:55:01 [name] ([id number]) [id number] Page 6 of 12 -------------------------------------------------------------------------------- Wound Assessment Details Patient Name: Date of Service: Dowty, DA [name] 03/11/2023 3:00 PM Medical Record Number: [id number] Patient Account Number: [id number] Date of Birth/Sex: Treating RN: 1946-03-17 (77 y.o. [name] Primary Care Dezire Turk: [name] Other Clinician: Referring Teejay Meader: Treating Ceylon Arenson/Extender: [name] Weeks in Treatment: 50 Wound Status Wound Number: 12 Primary Lymphedema Etiology: Wound Location: Left, Anterior Lower Leg Wound Open Wounding Event: Frostbite Status: Date Acquired: 12/17/2022 Comorbid Anemia, Lymphedema, Deep Vein Thrombosis, Hypertension, Weeks Of Treatment: 12 History: Received  Radiation Clustered Wound: No Photos Wound Measurements Length: (cm) 1 Width: (cm) 0.5 Depth: (cm) 0.1 Area: (cm) 0.393 Volume: (cm) 0.039 % Reduction in Area: 88.9% % Reduction in Volume: 94.5% Epithelialization: Small (1-33%) Tunneling: No Undermining: No Wound Description Classification: Full Thickness Without Exposed Suppor Wound Margin: Distinct, outline attached Exudate Amount: Large Exudate Type: Serous Exudate Color: amber t Structures Foul Odor After Cleansing: No Slough/Fibrino Yes Wound Bed Granulation Amount: Medium (34-66%) Exposed Structure Granulation Quality: Red, Pink Fascia Exposed: No Necrotic Amount: Medium (34-66%) Fat Layer (Subcutaneous Tissue) Exposed: Yes Necrotic Quality: Adherent Slough Tendon Exposed: No Muscle Exposed: No Joint Exposed: No Bone Exposed: No Periwound Skin Texture Texture Color No Abnormalities Noted: No No Abnormalities Noted: No Callus: No Atrophie Blanche: No Crepitus: No Cyanosis: No Excoriation: No Ecchymosis: No Induration: No Erythema: No Rash: No Hemosiderin Staining: Yes Scarring: No Mottled: No Pallor: No Moisture Rubor: No No Abnormalities Noted: No Dry / Scaly: No Temperature / Pain Maceration: No Temperature: No Abnormality Montante, Kaan ([id number]) [id number] Page 7 of 12 Treatment Notes Wound #12 (Lower Leg) Wound Laterality: Left, Anterior Cleanser Soap and Water Discharge Instruction: May shower and wash wound with dial antibacterial soap and water prior to dressing change. Peri-Wound Care Sween Lotion (Moisturizing lotion) Discharge Instruction: Apply moisturizing lotion as directed Topical Primary Dressing Aquacel Ag Discharge Instruction: apply directly to wound bed. Secondary Dressing ABD Pad, 8x10 Discharge Instruction: Apply over primary dressing as directed. OptiLock Super Absorbent, 5x5.5 (in/in) Discharge Instruction: Apply directly to wound bed  as directed Secured With Compression Wrap Urgo K2, (equivalent to a 4 layer) two layer compression system, regular Discharge Instruction: Apply Urgo K2 as directed (alternative to 4 layer compression). Compression Stockings Add-Ons Electronic Signature(s) Signed: 03/11/2023 5:00:37 PM By: [name] RN Signed: 03/13/2023 10:42:43 AM By: Karl Ito Entered By: Karl Ito on 03/11/2023 15:40:26 -------------------------------------------------------------------------------- Wound Assessment Details Patient Name: Date of Service: Zemanek, DA [name] 03/11/2023 3:00 PM Medical Record Number: [id number] Patient Account Number: [id number] Date of Birth/Sex: Treating RN: March 06, 1946 (77 y.o. [name] Primary Care Benaiah Behan: Orpah Cobb  S Other Clinician: Referring Rannie Craney: Treating Everly Rubalcava/Extender: Leveda Anna, [name] Weeks in Treatment: 50 Wound Status Wound Number: 15 Primary Lymphedema Etiology: Wound Location: Left, Distal, Posterior Lower Leg Wound Open Wounding Event: Gradually Appeared Status: Date Acquired: 01/14/2023 Comorbid Anemia, Lymphedema, Deep Vein Thrombosis, Hypertension, Weeks Of Treatment: 3 History: Received Radiation Clustered Wound: No Photos Snoke, Dannielle [name] ([id number]) [id number] Page 8 of 12 Wound Measurements Length: (cm) 3 Width: (cm) 2.1 Depth: (cm) 0.1 Area: (cm) 4.948 Volume: (cm) 0.495 % Reduction in Area: 44% % Reduction in Volume: 44% Epithelialization: Small (1-33%) Tunneling: No Undermining: No Wound Description Classification: Full Thickness Without Exposed Suppor Wound Margin: Distinct, outline attached Exudate Amount: Medium Exudate Type: Serosanguineous Exudate Color: red, brown t Structures Foul Odor After Cleansing: No Slough/Fibrino Yes Wound Bed Granulation Amount: Small (1-33%) Exposed Structure Granulation Quality: Pink Fascia Exposed: No Necrotic Amount:  Large (67-100%) Fat Layer (Subcutaneous Tissue) Exposed: Yes Necrotic Quality: Eschar, Adherent Slough Tendon Exposed: No Muscle Exposed: No Joint Exposed: No Bone Exposed: No Periwound Skin Texture Texture Color No Abnormalities Noted: No No Abnormalities Noted: No Callus: No Atrophie Blanche: No Crepitus: No Cyanosis: No Excoriation: No Ecchymosis: No Induration: No Erythema: No Rash: No Hemosiderin Staining: Yes Scarring: No Mottled: No Pallor: No Moisture Rubor: No No Abnormalities Noted: No Dry / Scaly: No Maceration: No Treatment Notes Wound #15 (Lower Leg) Wound Laterality: Left, Posterior, Distal Cleanser Soap and Water Discharge Instruction: May shower and wash wound with dial antibacterial soap and water prior to dressing change. Peri-Wound Care Sween Lotion (Moisturizing lotion) Discharge Instruction: Apply moisturizing lotion as directed Topical Primary Dressing Aquacel Ag Discharge Instruction: apply directly to wound bed. Secondary Dressing ABD Pad, 8x10 Discharge Instruction: Apply over primary dressing as directed. OptiLock Super Absorbent, 5x5.5 (in/in) Discharge Instruction: Apply directly to wound bed as directed Persichetti, Melquan ([id number]) [id number] Page 9 of 12 Secured With Compression Wrap Urgo K2, (equivalent to a 4 layer) two layer compression system, regular Discharge Instruction: Apply Urgo K2 as directed (alternative to 4 layer compression). Compression Stockings Add-Ons Electronic Signature(s) Signed: 03/11/2023 5:00:37 PM By: [name] RN Signed: 03/13/2023 10:42:43 AM By: Karl Ito Entered By: Karl Ito on 03/11/2023 15:40:53 -------------------------------------------------------------------------------- Wound Assessment Details Patient Name: Date of Service: Servais, DA [name] 03/11/2023 3:00 PM Medical Record Number: [id number] Patient Account Number: [id number] Date of  Birth/Sex: Treating RN: 05-02-1946 (77 y.o. [name] Primary Care Rishi Vicario: [name] Other Clinician: Referring Atticus Lemberger: Treating Berniece Abid/Extender: [name] Weeks in Treatment: 50 Wound Status Wound Number: 6 Primary Lymphedema Etiology: Wound Location: Right, Medial Lower Leg Wound Open Wounding Event: Gradually Appeared Status: Date Acquired: 03/05/2022 Comorbid Anemia, Lymphedema, Deep Vein Thrombosis, Hypertension, Weeks Of Treatment: 50 History: Received Radiation Clustered Wound: Yes Photos Wound Measurements Length: (cm) Width: (cm) Depth: (cm) Clustered Quantity: Area: (cm) Volume: (cm) 8 % Reduction in Area: 80.2% 4.5 % Reduction in Volume: 20.9% 0.4 Epithelialization: Small (1-33%) 1 Tunneling: No 28.274 Undermining: No 11.31 Wound Description Classification: Full Thickness Without Exposed Sup Wound Margin: Distinct, outline attached Exudate Amount: Large Exudate Type: Serous Exudate Color: amber port Structures Foul Odor After Cleansing: No Slough/Fibrino Yes Wound Bed Granulation Amount: Small (1-33%) Exposed Structure Granulation Quality: Pink Fascia Exposed: No Necrotic Amount: Large (67-100%) Fat Layer (Subcutaneous Tissue) Exposed: Yes Necrotic Quality: Adherent Slough Tendon Exposed: No Muscle Exposed: No Cristobal, Jair ([id number]) [id number] Page 10 of 12 Joint Exposed: No Bone Exposed: No Periwound Skin Texture Texture Color No Abnormalities Noted:  Burrous, Dannielle [name] ([id number]) [id number] Page 1 of 12 Visit Report for 03/11/2023 Arrival Information Details Patient Name: Date of Service: [name] 03/11/2023 3:00 PM Medical Record Number: [id number] Patient Account Number: [id number] Date of Birth/Sex: Treating RN: 01-16-1946 (77 y.o. M) Primary Care Emya Picado: [name] Other Clinician: Referring Avin Upperman: Treating Luciel Brickman/Extender: [name] in Treatment: 50 Visit Information History Since Last Visit Added or deleted any medications: No Patient Arrived: Cane Any new allergies or adverse reactions: No Arrival Time: 15:27 Had a fall or experienced change in No Accompanied By: self activities of daily living that may affect Transfer Assistance: None risk of falls: Patient Requires Transmission-Based Precautions: No Signs or symptoms of abuse/neglect since last visito No Patient Has Alerts: Yes Hospitalized since last visit: No Patient Alerts: Patient on Blood Thinner Implantable device outside of the clinic excluding No Right ABI in clinic [place] cellular tissue based products placed in the center since last visit: Has Dressing in Place as Prescribed: Yes Pain Present Now: No Electronic Signature(s) Signed: 03/13/2023 10:42:43 AM By: Karl Ito Entered By: Karl Ito on 03/11/2023 15:27:43 -------------------------------------------------------------------------------- Compression Therapy Details Patient Name: Date of Service: Saffo, DA [name] 03/11/2023 3:00 PM Medical Record Number: [id number] Patient Account Number: [id number] Date of Birth/Sex: Treating RN: 1945/07/15 (77 y.o. [name] Primary Care Jackie Littlejohn: [name] Other Clinician: Referring Chinelo Benn: Treating Dru Laurel/Extender: [name] Weeks in Treatment: 50 Compression Therapy Performed for Wound Assessment: Wound #12 Left,Anterior Lower  [name], RN Compression Type: Four Layer Post Procedure Diagnosis Same as Pre-procedure Electronic Signature(s) Signed: 03/11/2023 5:00:37 PM By: [name] RN Entered By: [name] on 03/11/2023 16:04:22 Arreaga, Dannielle [name] ([id number]) [id number] Page 2 of 12 -------------------------------------------------------------------------------- Compression Therapy Details Patient Name: Date of Service: Pilger, DA [name] 03/11/2023 3:00 PM Medical Record Number: [id number] Patient Account Number: [id number] Date of Birth/Sex: Treating RN: 11/17/1945 (77 y.o. [name] Primary Care Taraji Mungo: [name] Other Clinician: Referring Merla Sawka: Treating Ryota Treece/Extender: [name] Weeks in Treatment: 50 Compression Therapy Performed for Wound Assessment: Wound #15 Left,Distal,Posterior Lower [name], RN Compression Type: Four Layer Post Procedure Diagnosis Same as Pre-procedure Electronic Signature(s) Signed: 03/11/2023 5:00:37 PM By: [name] RN Entered By: [name] on 03/11/2023 16:04:22 -------------------------------------------------------------------------------- Compression Therapy Details Patient Name: Date of Service: Seamans, DA [name] 03/11/2023 3:00 PM Medical Record Number: [id number] Patient Account Number: [id number] Date of Birth/Sex: Treating RN: January 30, 1946 (77 y.o. [name] Primary Care Giavanni Odonovan: [name] Other Clinician: Referring Panda Crossin: Treating Kalen Ratajczak/Extender: [name] Weeks in Treatment: 50 Compression Therapy Performed for Wound Assessment: Wound #6 Right,Medial Lower [name], RN Compression Type: Four Layer Post Procedure Diagnosis Same as Pre-procedure Electronic Signature(s) Signed: 03/11/2023 5:00:37 PM By: [name]  RN Entered By: [name] on 03/11/2023 16:04:22 -------------------------------------------------------------------------------- Compression Therapy Details Patient Name: Date of Service: Gaymon, DA [name] 03/11/2023 3:00 PM Medical Record Number: [id number] Patient Account Number: [id number] Date of Birth/Sex: Treating RN: 1945-07-08 (76 y.o. [name] Primary Care Charlcie Prisco: [name] Other Clinician: Referring Meldon Hanzlik: Treating Jenan Ellegood/Extender: [name] Weeks in Treatment: 50 Compression Therapy Performed for Wound Assessment: Wound #7 Left,Medial Lower [name], RN Compression Type: Four Layer Post Procedure Diagnosis Same as Pre-procedure Electronic Signature(s) Signed: 03/11/2023 5:00:37 PM By: [name] RN Brisky, Dannielle [name] ([id number])  No No Abnormalities Noted: No Callus: No Atrophie Blanche: No Crepitus: No Cyanosis: No Excoriation: No Ecchymosis: No Induration: No Erythema: No Rash: No Hemosiderin Staining: Yes Scarring: Yes Mottled: No Pallor: No Moisture Rubor: No No Abnormalities Noted: No Dry / Scaly: No Temperature / Pain Maceration: No Temperature: No Abnormality Treatment Notes Wound #6 (Lower Leg) Wound Laterality: Right,  Medial Cleanser Soap and Water Discharge Instruction: May shower and wash wound with dial antibacterial soap and water prior to dressing change. Peri-Wound Care Sween Lotion (Moisturizing lotion) Discharge Instruction: Apply moisturizing lotion as directed Topical Primary Dressing Aquacel Ag Discharge Instruction: apply directly to wound bed. Secondary Dressing ABD Pad, 8x10 Discharge Instruction: Apply over primary dressing as directed. OptiLock Super Absorbent, 5x5.5 (in/in) Discharge Instruction: Apply directly to wound bed as directed Secured With Compression Wrap Urgo K2, (equivalent to a 4 layer) two layer compression system, regular Discharge Instruction: Apply Urgo K2 as directed (alternative to 4 layer compression). Compression Stockings Add-Ons Electronic Signature(s) Signed: 03/11/2023 5:00:37 PM By: [name] RN Signed: 03/13/2023 10:42:43 AM By: Karl Ito Entered By: Karl Ito on 03/11/2023 15:41:30 -------------------------------------------------------------------------------- Wound Assessment Details Patient Name: Date of Service: Bayne, DA [name] 03/11/2023 3:00 PM Medical Record Number: [id number] Patient Account Number: [id number] Date of Birth/Sex: Treating RN: 30-Jul-1945 (77 y.o. [name] Primary Care Jarad Barth: [name] Other Clinician: Referring Ascencion Coye: Treating Alyaan Budzynski/Extender: Leveda Anna, [name] Weeks in Treatment: 50 Wound Status Wound Number: 7 Primary Lymphedema Maus, Dannielle [name] ([id number]) [id number] Page 11 of 12 Etiology: Wound Location: Left, Medial Lower Leg Wound Open Wounding Event: Gradually Appeared Status: Date Acquired: 03/05/2022 Comorbid Anemia, Lymphedema, Deep Vein Thrombosis, Hypertension, Weeks Of Treatment: 50 History: Received Radiation Clustered Wound: Yes Photos Wound Measurements Length: (cm) Width: (cm) Depth: (cm) Clustered  Quantity: Area: (cm) Volume: (cm) 3 % Reduction in Area: 84% 3.2 % Reduction in Volume: 84% 0.1 Epithelialization: Small (1-33%) 1 Tunneling: No 7.54 Undermining: No 0.754 Wound Description Classification: Full Thickness Without Exposed Sup Wound Margin: Distinct, outline attached Exudate Amount: Large Exudate Type: Serous Exudate Color: amber port Structures Foul Odor After Cleansing: No Slough/Fibrino Yes Wound Bed Granulation Amount: Small (1-33%) Exposed Structure Granulation Quality: Red Fascia Exposed: No Necrotic Amount: Large (67-100%) Fat Layer (Subcutaneous Tissue) Exposed: Yes Necrotic Quality: Eschar, Adherent Slough Tendon Exposed: No Muscle Exposed: No Joint Exposed: No Bone Exposed: No Periwound Skin Texture Texture Color No Abnormalities Noted: No No Abnormalities Noted: No Callus: No Atrophie Blanche: No Crepitus: No Cyanosis: No Excoriation: No Ecchymosis: No Induration: No Erythema: No Rash: No Hemosiderin Staining: Yes Scarring: Yes Mottled: No Pallor: No Moisture Rubor: No No Abnormalities Noted: No Dry / Scaly: No Temperature / Pain Maceration: No Temperature: No Abnormality Treatment Notes Wound #7 (Lower Leg) Wound Laterality: Left, Medial Cleanser Soap and Water Discharge Instruction: May shower and wash wound with dial antibacterial soap and water prior to dressing change. Peri-Wound Care Sween Lotion (Moisturizing lotion) Discharge Instruction: Apply moisturizing lotion as directed Topical Primary Dressing Aquacel Ag Athanas, Adaiah ([id number]) [id number] Page 12 of 12 Discharge Instruction: apply directly to wound bed. Secondary Dressing ABD Pad, 8x10 Discharge Instruction: Apply over primary dressing as directed. OptiLock Super Absorbent, 5x5.5 (in/in) Discharge Instruction: Apply directly to wound bed as directed Secured With Compression Wrap Urgo K2, (equivalent to a 4 layer) two layer  compression system, regular Discharge Instruction: Apply Urgo K2 as directed (alternative to 4 layer compression). Compression Stockings Add-Ons Electronic Signature(s) Signed: 03/11/2023 5:00:37 PM By: [name] RN  No No Abnormalities Noted: No Callus: No Atrophie Blanche: No Crepitus: No Cyanosis: No Excoriation: No Ecchymosis: No Induration: No Erythema: No Rash: No Hemosiderin Staining: Yes Scarring: Yes Mottled: No Pallor: No Moisture Rubor: No No Abnormalities Noted: No Dry / Scaly: No Temperature / Pain Maceration: No Temperature: No Abnormality Treatment Notes Wound #6 (Lower Leg) Wound Laterality: Right,  Medial Cleanser Soap and Water Discharge Instruction: May shower and wash wound with dial antibacterial soap and water prior to dressing change. Peri-Wound Care Sween Lotion (Moisturizing lotion) Discharge Instruction: Apply moisturizing lotion as directed Topical Primary Dressing Aquacel Ag Discharge Instruction: apply directly to wound bed. Secondary Dressing ABD Pad, 8x10 Discharge Instruction: Apply over primary dressing as directed. OptiLock Super Absorbent, 5x5.5 (in/in) Discharge Instruction: Apply directly to wound bed as directed Secured With Compression Wrap Urgo K2, (equivalent to a 4 layer) two layer compression system, regular Discharge Instruction: Apply Urgo K2 as directed (alternative to 4 layer compression). Compression Stockings Add-Ons Electronic Signature(s) Signed: 03/11/2023 5:00:37 PM By: [name] RN Signed: 03/13/2023 10:42:43 AM By: Karl Ito Entered By: Karl Ito on 03/11/2023 15:41:30 -------------------------------------------------------------------------------- Wound Assessment Details Patient Name: Date of Service: [place], DA [name] 03/11/2023 3:00 PM Medical Record Number: [id number] Patient Account Number: [id number] Date of Birth/Sex: Treating RN: 30-Jul-1945 (77 y.o. [name] Primary Care Jarad Barth: [name] Other Clinician: Referring Ascencion Coye: Treating Alyaan Budzynski/Extender: Leveda Anna, [name] Weeks in Treatment: 50 Wound Status Wound Number: 7 Primary Lymphedema Maus, Dannielle [name] ([id number]) [id number] Page 11 of 12 Etiology: Wound Location: Left, Medial Lower Leg Wound Open Wounding Event: Gradually Appeared Status: Date Acquired: 03/05/2022 Comorbid Anemia, Lymphedema, Deep Vein Thrombosis, Hypertension, Weeks Of Treatment: 50 History: Received Radiation Clustered Wound: Yes Photos Wound Measurements Length: (cm) Width: (cm) Depth: (cm) Clustered  Quantity: Area: (cm) Volume: (cm) 3 % Reduction in Area: 84% 3.2 % Reduction in Volume: 84% 0.1 Epithelialization: Small (1-33%) 1 Tunneling: No 7.54 Undermining: No 0.754 Wound Description Classification: Full Thickness Without Exposed Sup Wound Margin: Distinct, outline attached Exudate Amount: Large Exudate Type: Serous Exudate Color: amber port Structures Foul Odor After Cleansing: No Slough/Fibrino Yes Wound Bed Granulation Amount: Small (1-33%) Exposed Structure Granulation Quality: Red Fascia Exposed: No Necrotic Amount: Large (67-100%) Fat Layer (Subcutaneous Tissue) Exposed: Yes Necrotic Quality: Eschar, Adherent Slough Tendon Exposed: No Muscle Exposed: No Joint Exposed: No Bone Exposed: No Periwound Skin Texture Texture Color No Abnormalities Noted: No No Abnormalities Noted: No Callus: No Atrophie Blanche: No Crepitus: No Cyanosis: No Excoriation: No Ecchymosis: No Induration: No Erythema: No Rash: No Hemosiderin Staining: Yes Scarring: Yes Mottled: No Pallor: No Moisture Rubor: No No Abnormalities Noted: No Dry / Scaly: No Temperature / Pain Maceration: No Temperature: No Abnormality Treatment Notes Wound #7 (Lower Leg) Wound Laterality: Left, Medial Cleanser Soap and Water Discharge Instruction: May shower and wash wound with dial antibacterial soap and water prior to dressing change. Peri-Wound Care Sween Lotion (Moisturizing lotion) Discharge Instruction: Apply moisturizing lotion as directed Topical Primary Dressing Aquacel Ag Athanas, Adaiah ([id number]) [id number] Page 12 of 12 Discharge Instruction: apply directly to wound bed. Secondary Dressing ABD Pad, 8x10 Discharge Instruction: Apply over primary dressing as directed. OptiLock Super Absorbent, 5x5.5 (in/in) Discharge Instruction: Apply directly to wound bed as directed Secured With Compression Wrap Urgo K2, (equivalent to a 4 layer) two layer  compression system, regular Discharge Instruction: Apply Urgo K2 as directed (alternative to 4 layer compression). Compression Stockings Add-Ons Electronic Signature(s) Signed: 03/11/2023 5:00:37 PM By: [name] RN

## 2023-03-18 ENCOUNTER — Ambulatory Visit (HOSPITAL_BASED_OUTPATIENT_CLINIC_OR_DEPARTMENT_OTHER): Payer: Medicare Other | Admitting: Physician Assistant

## 2023-03-25 ENCOUNTER — Encounter (HOSPITAL_BASED_OUTPATIENT_CLINIC_OR_DEPARTMENT_OTHER): Payer: Medicare Other | Attending: Physician Assistant | Admitting: Internal Medicine

## 2023-03-25 DIAGNOSIS — L97818 Non-pressure chronic ulcer of other part of right lower leg with other specified severity: Secondary | ICD-10-CM | POA: Insufficient documentation

## 2023-03-25 DIAGNOSIS — Z86718 Personal history of other venous thrombosis and embolism: Secondary | ICD-10-CM | POA: Diagnosis not present

## 2023-03-25 DIAGNOSIS — I87333 Chronic venous hypertension (idiopathic) with ulcer and inflammation of bilateral lower extremity: Secondary | ICD-10-CM | POA: Diagnosis present

## 2023-03-25 DIAGNOSIS — L97828 Non-pressure chronic ulcer of other part of left lower leg with other specified severity: Secondary | ICD-10-CM | POA: Insufficient documentation

## 2023-03-25 DIAGNOSIS — Z923 Personal history of irradiation: Secondary | ICD-10-CM | POA: Diagnosis not present

## 2023-03-25 DIAGNOSIS — Z7901 Long term (current) use of anticoagulants: Secondary | ICD-10-CM | POA: Insufficient documentation

## 2023-03-25 DIAGNOSIS — I129 Hypertensive chronic kidney disease with stage 1 through stage 4 chronic kidney disease, or unspecified chronic kidney disease: Secondary | ICD-10-CM | POA: Diagnosis not present

## 2023-03-25 DIAGNOSIS — Z8546 Personal history of malignant neoplasm of prostate: Secondary | ICD-10-CM | POA: Insufficient documentation

## 2023-03-25 DIAGNOSIS — I890 Lymphedema, not elsewhere classified: Secondary | ICD-10-CM | POA: Diagnosis not present

## 2023-03-25 DIAGNOSIS — L84 Corns and callosities: Secondary | ICD-10-CM | POA: Diagnosis not present

## 2023-03-25 DIAGNOSIS — N1832 Chronic kidney disease, stage 3b: Secondary | ICD-10-CM | POA: Diagnosis not present

## 2023-03-26 NOTE — Progress Notes (Signed)
[name] ([id number]) [id number] Page 1 of 16 Visit Report for 03/25/2023 Arrival Information Details Patient Name: Date of Service: [name], [name] 03/25/2023 2:15 PM Medical Record Number: [id number] Patient Account Number: [id number] Date of Birth/Sex: Treating RN: February 28, 1946 (77 y.o. M) Primary Care Brynnlie Unterreiner: [name] Other Clinician: Referring Fed Ceci: Treating Idus Rathke/Extender: Stark Klein in Treatment: 59 Visit Information History Since Last Visit Added or deleted any medications: No Patient Arrived: Cane Any new allergies or adverse reactions: No Arrival Time: 15:08 Had a fall or experienced change in No Accompanied By: self activities of daily living that may affect Transfer Assistance: Manual risk of falls: Patient Identification Verified: Yes Signs or symptoms of abuse/neglect since last visito No Secondary Verification Process Completed: Yes Hospitalized since last visit: No Patient Requires Transmission-Based Precautions: No Implantable device outside of the clinic excluding No Patient Has Alerts: Yes cellular tissue based products placed in the center Patient Alerts: Patient on Blood Thinner since last visit: Right ABI in clinic [place] Pain Present Now: No Electronic Signature(s) Signed: 03/25/2023 3:52:08 PM By: Dayton Scrape Entered By: Dayton Scrape on 03/25/2023 12:08:41 -------------------------------------------------------------------------------- Compression Therapy Details Patient Name: Date of Service: [name], DA [name] 03/25/2023 2:15 PM Medical Record Number: [id number] Patient Account Number: [id number] Date of Birth/Sex: Treating RN: May 24, 1945 (77 y.o. [name] Primary Care Charletha Dalpe: [name] Other Clinician: Referring Drucilla Cumber: Treating Lleyton Byers/Extender: Valentina Gu Weeks in Treatment: 66 Compression Therapy Performed for Wound Assessment: Wound #12  Left,Anterior Lower Leg Performed By: Clinician [name], RN Compression Type: Double Layer Post Procedure Diagnosis Same as Pre-procedure Electronic Signature(s) Signed: 03/25/2023 6:03:26 PM By: [name] RN, [name] Entered By: [name] on 03/25/2023 12:51:41 Compression Therapy Details -------------------------------------------------------------------------------- Benzel, Dannielle [name] ([id number]) [id number] Page 2 of 16 Patient Name: Date of Service: [name] 03/25/2023 2:15 PM Medical Record Number: [id number] Patient Account Number: [id number] Date of Birth/Sex: Treating RN: [birth year] (77 y.o. [name] Primary Care Erick Oxendine: [name] Other Clinician: Referring Dusten Ellinwood: Treating Hunt Zajicek/Extender: Valentina Gu Weeks in Treatment: 38 Compression Therapy Performed for Wound Assessment: Wound #7 Left,Medial Lower Leg Performed By: Clinician [name], RN Compression Type: Double Layer Post Procedure Diagnosis Same as Pre-procedure Electronic Signature(s) Signed: 03/25/2023 6:03:26 PM By: [name] RN, [name] Entered By: [name] on 03/25/2023 12:51:41 -------------------------------------------------------------------------------- Compression Therapy Details Patient Name: Date of Service: [name], DA [name] 03/25/2023 2:15 PM Medical Record Number: [id number] Patient Account Number: [id number] Date of Birth/Sex: Treating RN: 10-23-1945 (77 y.o. [name] Primary Care Phylicia Mcgaugh: [name] Other Clinician: Referring Royce Sciara: Treating Konstantina Nachreiner/Extender: Valentina Gu Weeks in Treatment: 10 Compression Therapy Performed for Wound Assessment: Wound #6 Right,Medial Lower Leg Performed By: Clinician [name], RN Compression Type: Double Layer Post Procedure Diagnosis Same as Pre-procedure Electronic Signature(s) Signed: 03/25/2023 6:03:26 PM By: [name] RN,  [name] Entered By: [name] on 03/25/2023 12:51:41 -------------------------------------------------------------------------------- Compression Therapy Details Patient Name: Date of Service: [name], DA [name] 03/25/2023 2:15 PM Medical Record Number: [id number] Patient Account Number: [id number] Date of Birth/Sex: Treating RN: 10-25-1945 (77 y.o. [name] Primary Care Harshil Cavallaro: [name] Other Clinician: Referring Arionna Hoggard: Treating Binta Statzer/Extender: Valentina Gu Weeks in Treatment: 52 Compression Therapy Performed for Wound Assessment: Wound #15 Left,Distal,Posterior Lower Leg Performed By: Clinician [name], RN Compression Type: Double Layer Post Procedure Diagnosis Same as Pre-procedure Electronic Signature(s) Signed: 03/25/2023 6:03:26 PM By: [name] RN, [name] Entered By:  [name] on 03/25/2023 12:51:41 Redditt, Dannielle [name] ([id number]) [id number] Page 3 of 16 -------------------------------------------------------------------------------- Encounter Discharge Information Details Patient Name: Date of Service: [name] 03/25/2023 2:15 PM Medical Record Number: [id number] Patient Account Number: [id number] Date of Birth/Sex: Treating RN: [birth year] (77 y.o. [name] Primary Care Sybilla Malhotra: [name] Other Clinician: Referring Amarrion Pastorino: Treating Jeanetta Alonzo/Extender: Valentina Gu Weeks in Treatment: 55 Encounter Discharge Information Items Discharge Condition: Stable Ambulatory Status: Cane Discharge Destination: Home Transportation: Private Auto Accompanied By: self Schedule Follow-up Appointment: Yes Clinical Summary of Care: Patient Declined Electronic Signature(s) Signed: 03/25/2023 6:03:26 PM By: [name] RN, [name] Entered By: [name] on 03/25/2023 12:56:11 -------------------------------------------------------------------------------- Lower Extremity  Assessment Details Patient Name: Date of Service: [name] 03/25/2023 2:15 PM Medical Record Number: [id number] Patient Account Number: [id number] Date of Birth/Sex: Treating RN: Sep 14, 1945 (77 y.o. Marlan Palau Primary Care Turner Baillie: [name] Other Clinician: Referring Tirza Senteno: Treating Lynnel Zanetti/Extender: Valentina Gu Weeks in Treatment: 52 Edema Assessment Assessed: [Left: No] [Right: No] Edema: [Left: Yes] [Right: Yes] Calf Left: Right: Point of Measurement: 40 cm From Medial Instep 56.1 cm 58.4 cm Ankle Left: Right: Point of Measurement: 13 cm From Medial Instep 43.8 cm 41.9 cm Vascular Assessment Pulses: Dorsalis Pedis Palpable: [Left:No] [Right:No] Extremity colors, hair growth, and conditions: Extremity Color: [Left:Hyperpigmented] [Right:Hyperpigmented] Hair Growth on Extremity: [Left:No] Temperature of Extremity: [Left:Warm] [Right:Warm] Capillary Refill: [Left:> 3 seconds] [Right:> 3 seconds] Dependent Rubor: [Left:No] [Right:No] 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 M) Primary Care Cherry Turlington: [name] Other Clinician: Referring Emiya Loomer: Treating Gabbie Marzo/Extender: Valentina Gu Weeks in Treatment: 91 Vital Signs Height(in): 74 Pulse(bpm): 111 Weight(lbs): 250 Blood Pressure(mmHg): 191/79 Body Mass Index(BMI): 32.1 Temperature(F): 98.0 Respiratory Rate(breaths/min):  18 [12:Photos:] Left, Anterior Lower Leg Left, Distal, Posterior Lower Leg Right, Medial Lower Leg Wound Location: Frostbite Gradually Appeared Gradually Appeared Wounding Event: Lymphedema Lymphedema Lymphedema Primary Etiology: Anemia, Lymphedema, Deep Vein Anemia, Lymphedema, Deep Vein Anemia, Lymphedema, Deep Vein Comorbid History: Thrombosis, Hypertension, Received Thrombosis, Hypertension, Received Thrombosis, Hypertension, Received Radiation Radiation Radiation 12/17/2022 01/14/2023 03/05/2022 Date Acquired: 14 5 52 Weeks of Treatment: Open Open Open Wound Status: No No No Wound Recurrence: No No Yes Clustered Wound: N/A N/A 1 Clustered Quantity: 0.5x0.5x0.1 11x7.5x0.1 15x25x0.1 Measurements L x W x D (cm) 0.196 64.795 294.524 A (cm) : rea 0.02 6.48 29.452 Volume (cm) : 94.50% -633.30% -106.00% % Reduction in Area: 97.20% -633.00% -106.00% % Reduction in Volume: Full Thickness Without Exposed Full Thickness Without Exposed Full Thickness Without Exposed Classification: Support Structures Support Structures Support Structures Large Medium Large Exudate A mount: Serous Serosanguineous Serous Exudate Type: amber red, brown amber Exudate Color: Distinct, outline attached Distinct, outline attached Distinct, outline attached Wound Margin: Medium (34-66%) Small (1-33%) Small (1-33%) Granulation Amount: Red, Pink Pink Pink Granulation Quality: Medium (34-66%) Large (67-100%) Large (67-100%) Necrotic Amount: Adherent Slough Adherent Slough N/A Necrotic Tissue: Fat Layer (Subcutaneous Tissue): Yes Fat Layer (Subcutaneous Tissue): Yes Fat Layer (Subcutaneous Tissue): Yes Exposed Structures: Fascia: No Fascia: No Fascia: No Tendon: No Tendon: No Tendon: No Muscle: No Muscle: No Muscle: No Joint: No Joint: No Joint: No Bone: No Bone: No  Bone: No Small (1-33%) Small (1-33%) Small (1-33%) Epithelialization: Excoriation: No Excoriation: No Scarring:  Yes Periwound Skin Texture: Induration: No Induration: No Excoriation: No Callus: No Callus: No Induration: No Crepitus: No Crepitus: No Callus: No Rash: No Rash: No Crepitus: No Scarring: No Scarring: No Rash: No Maceration: No Maceration: No Maceration: No Periwound Skin Moisture: Amborn, Zaccheaus ([id number]) [id number] Page 5 of 16 Dry/Scaly: No Dry/Scaly: No Dry/Scaly: No Hemosiderin Staining: Yes Hemosiderin Staining: Yes Hemosiderin Staining: Yes Periwound Skin Color: Atrophie Blanche: No Atrophie Blanche: No Atrophie Blanche: No Cyanosis: No Cyanosis: No Cyanosis: No Ecchymosis: No Ecchymosis: No Ecchymosis: No Erythema: No Erythema: No Erythema: No Mottled: No Mottled: No Mottled: No Pallor: No Pallor: No Pallor: No Rubor: No Rubor: No Rubor: No No Abnormality N/A No Abnormality Temperature: Compression Therapy Compression Therapy Compression Therapy Procedures Performed: Wound Number: 7 N/A N/A Photos: N/A N/A Left, Medial Lower Leg N/A N/A Wound Location: Gradually Appeared N/A N/A Wounding Event: Lymphedema N/A N/A Primary Etiology: Anemia, Lymphedema, Deep Vein N/A N/A Comorbid History: Thrombosis, Hypertension, Received Radiation 03/05/2022 N/A N/A Date Acquired: 51 N/A N/A Weeks of Treatment: Open N/A N/A Wound Status: No N/A N/A Wound Recurrence: Yes N/A N/A Clustered Wound: 1 N/A N/A Clustered Quantity: 6x16x0.1 N/A N/A Measurements L x W x D (cm) 75.398 N/A N/A A (cm) : rea 7.54 N/A N/A Volume (cm) : -60.00% N/A N/A % Reduction in Area: -60.00% N/A N/A % Reduction in Volume: Full Thickness Without Exposed N/A N/A Classification: Support Structures Large N/A N/A Exudate A mount: Serous N/A N/A Exudate Type: amber N/A N/A Exudate Color: Distinct, outline attached N/A N/A Wound Margin: Small (1-33%) N/A N/A Granulation Amount: Red N/A N/A Granulation Quality: Large (67-100%)  N/A N/A Necrotic Amount: Eschar N/A N/A Necrotic Tissue: Fat Layer (Subcutaneous Tissue): Yes N/A N/A Exposed Structures: Fascia: No Tendon: No Muscle: No Joint: No Bone: No Small (1-33%) N/A N/A Epithelialization: Scarring: Yes N/A N/A Periwound Skin Texture: Excoriation: No Induration: No Callus: No Crepitus: No Rash: No Maceration: No N/A N/A Periwound Skin Moisture: Dry/Scaly: No Hemosiderin Staining: Yes N/A N/A Periwound Skin Color: Atrophie Blanche: No Cyanosis: No Ecchymosis: No Erythema: No Mottled: No Pallor: No Rubor: No No Abnormality N/A N/A Temperature: Compression Therapy N/A N/A Procedures Performed: Treatment Notes Wound #12 (Lower Leg) Wound Laterality: Left, Anterior Cleanser Soap and Water Discharge Instruction: May shower and wash wound with dial antibacterial soap and water prior to dressing change. Peri-Wound Care Plaisted, Dannielle [name] ([id number]) [id number].pdf Page 6 of 16 Sween Lotion (Moisturizing lotion) Discharge Instruction: Apply moisturizing lotion as directed Topical Primary Dressing Maxorb Extra CMC/Alginate Dressing, 4x4 (in/in) Discharge Instruction: Apply to wound bed as instructed Secondary Dressing ABD Pad, 8x10 Discharge Instruction: Apply over primary dressing as directed. Zetuvit Plus 4x8 in Discharge Instruction: Apply over primary dressing as directed. OptiLock Super Absorbent, 5x5.5 (in/in) Discharge Instruction: Apply directly to wound bed as directed Secured With Compression Wrap Urgo K2, (equivalent to a 4 layer) two layer compression system, regular Discharge Instruction: Apply Urgo K2 as directed (alternative to 4 layer compression). Compression Stockings Add-Ons Wound #15 (Lower Leg) Wound Laterality: Left, Posterior, Distal Cleanser Soap and Water Discharge Instruction: May shower and wash wound with dial antibacterial soap and water prior to dressing change. Peri-Wound Care Sween  Lotion (Moisturizing lotion) Discharge Instruction: Apply moisturizing lotion as directed Topical Primary Dressing Maxorb Extra CMC/Alginate Dressing, 4x4 (in/in) Discharge Instruction: Apply to wound bed as instructed Secondary Dressing ABD Pad, 8x10 Discharge Instruction: Apply over primary dressing  as directed. Zetuvit Plus 4x8 in Discharge Instruction: Apply over primary dressing as directed. OptiLock Super Absorbent, 5x5.5 (in/in) Discharge Instruction: Apply directly to wound bed as directed Secured With Compression Wrap Urgo K2, (equivalent to a 4 layer) two layer compression system, regular Discharge Instruction: Apply Urgo K2 as directed (alternative to 4 layer compression). Compression Stockings Add-Ons Wound #6 (Lower Leg) Wound Laterality: Right, Medial Cleanser Soap and Water Discharge Instruction: May shower and wash wound with dial antibacterial soap and water prior to dressing change. Peri-Wound Care Sween Lotion (Moisturizing lotion) Discharge Instruction: Apply moisturizing lotion as directed Topical Primary Dressing Hetland, Dannielle [name] ([id number]) [id number] Page 7 of 16 Maxorb Extra CMC/Alginate Dressing, 4x4 (in/in) Discharge Instruction: Apply to wound bed as instructed Secondary Dressing ABD Pad, 8x10 Discharge Instruction: Apply over primary dressing as directed. Zetuvit Plus 4x8 in Discharge Instruction: Apply over primary dressing as directed. OptiLock Super Absorbent, 5x5.5 (in/in) Discharge Instruction: Apply directly to wound bed as directed Secured With Compression Wrap Urgo K2, (equivalent to a 4 layer) two layer compression system, regular Discharge Instruction: Apply Urgo K2 as directed (alternative to 4 layer compression). Compression Stockings Add-Ons Wound #7 (Lower Leg) Wound Laterality: Left, Medial Cleanser Soap and Water Discharge Instruction: May shower and wash wound with dial antibacterial soap and water  prior to dressing change. Peri-Wound Care Sween Lotion (Moisturizing lotion) Discharge Instruction: Apply moisturizing lotion as directed Topical Primary Dressing Maxorb Extra CMC/Alginate Dressing, 4x4 (in/in) Discharge Instruction: Apply to wound bed as instructed Secondary Dressing ABD Pad, 8x10 Discharge Instruction: Apply over primary dressing as directed. Zetuvit Plus 4x8 in Discharge Instruction: Apply over primary dressing as directed. OptiLock Super Absorbent, 5x5.5 (in/in) Discharge Instruction: Apply directly to wound bed as directed Secured With Compression Wrap Urgo K2, (equivalent to a 4 layer) two layer compression system, regular Discharge Instruction: Apply Urgo K2 as directed (alternative to 4 layer compression). Compression Stockings Add-Ons Electronic Signature(s) Signed: 03/25/2023 4:54:50 PM By: Baltazar Najjar MD Entered By: Baltazar Najjar on 03/25/2023 13:30:13 -------------------------------------------------------------------------------- Multi-Disciplinary Care Plan Details Patient Name: Date of Service: Rudin, DA [name] 03/25/2023 2:15 PM Medical Record Number: [id number] Patient Account Number: [id number] Date of Birth/Sex: Treating RN: 1945-07-23 (77 y.o. [name] Primary Care Caitlyne Ingham: [name] Other Clinician: Mathe, Dannielle [name] ([id number]) [id number] Page 8 of 16 Referring Ranya Fiddler: Treating Chelci Wintermute/Extender: Stark Klein in Treatment: 81 Multidisciplinary Care Plan reviewed with physician Active Inactive Wound/Skin Impairment Nursing Diagnoses: Impaired tissue integrity Knowledge deficit related to ulceration/compromised skin integrity Goals: Patient will have a decrease in wound volume by X% from date: (specify in notes) Date Initiated: 03/26/2022 Target Resolution Date: 04/17/2025 Goal Status: Active Patient/caregiver will verbalize understanding of skin care regimen Date  Initiated: 03/26/2022 Date Inactivated: 03/25/2023 Target Resolution Date: 04/17/2025 Unmet Reason: patient missed last Goal Status: Unmet week. Patient Stop taking his diuertic. Ulcer/skin breakdown will have a volume reduction of 30% by week 4 Date Initiated: 03/26/2022 Date Inactivated: 05/21/2022 Target Resolution Date: 05/17/2022 Unmet Reason: see wound Goal Status: Unmet measurement. Ulcer/skin breakdown will have a volume reduction of 50% by week 8 Date Initiated: 03/26/2022 Date Inactivated: 05/21/2022 Target Resolution Date: 05/17/2022 Unmet Reason: see wound Goal Status: Unmet measurement. Interventions: Assess patient/caregiver ability to obtain necessary supplies Assess patient/caregiver ability to perform ulcer/skin care regimen upon admission and as needed Assess ulceration(s) every visit Notes: Patient stated today, "I will take my fluid pill or pump not do both." Violetta Lavalle made aware. Electronic Signature(s) Signed: 03/25/2023 6:03:26 PM By: [name]  RN, [name] Entered By: [name] on 03/25/2023 12:45:55 -------------------------------------------------------------------------------- Pain Assessment Details Patient Name: Date of Service: Sanmiguel, [name] 03/25/2023 2:15 PM Medical Record Number: [id number] Patient Account Number: [id number] Date of Birth/Sex: Treating RN: 10/29/1945 (77 y.o. M) Primary Care Tremel Setters: [name] Other Clinician: Referring Kebrina Friend: Treating Cheronda Erck/Extender: Valentina Gu Weeks in Treatment: 37 Active Problems Location of Pain Severity and Description of Pain Patient Has Paino No Site Locations [place], [facility] ([id number]) [id number] Page 9 of 16 Pain Management and Medication Current Pain Management: Electronic Signature(s) Signed: 03/25/2023 3:52:08 PM By: Dayton Scrape Entered By: Dayton Scrape on 03/25/2023  12:09:56 -------------------------------------------------------------------------------- Patient/Caregiver Education Details Patient Name: Date of Service: Eisenhardt, DA [name] 11/6/2024andnbsp2:15 PM Medical Record Number: [id number] Patient Account Number: [id number] Date of Birth/Gender: Treating RN: 08-20-1945 (77 y.o. [name] Primary Care Physician: [name] Other Clinician: Referring Physician: Treating Physician/Extender: Stark Klein in Treatment: 58 Education Assessment Education Provided To: Patient Education Topics Provided Wound/Skin Impairment: Handouts: Caring for Your Ulcer Methods: Explain/Verbal Responses: Reinforcements needed Electronic Signature(s) Signed: 03/25/2023 6:03:26 PM By: [name] RN, [name] Entered By: [name] on 03/25/2023 12:46:24 -------------------------------------------------------------------------------- Wound Assessment Details Patient Name: Date of Service: Cones, DA [name] 03/25/2023 2:15 PM Medical Record Number: [id number] Patient Account Number: [id number] Date of Birth/Sex: Treating RN: [name], 1945 (77 y.o. M) Primary Care Jerie Basford: [name] Other Clinician: Vanwinkle, Dannielle [name] ([id number]) [id number] Page 10 of 16 Referring Dorothea Yow: Treating Alayziah Tangeman/Extender: Dominga Ferry, [name] Weeks in Treatment: 52 Wound Status Wound Number: 12 Primary Lymphedema Etiology: Wound Location: Left, Anterior Lower Leg Wound Open Wounding Event: Frostbite Status: Date Acquired: 12/17/2022 Comorbid Anemia, Lymphedema, Deep Vein Thrombosis, Hypertension, Weeks Of Treatment: 14 History: Received Radiation Clustered Wound: No Photos Wound Measurements Length: (cm) 0.5 Width: (cm) 0.5 Depth: (cm) 0.1 Area: (cm) 0.196 Volume: (cm) 0.02 % Reduction in Area: 94.5% % Reduction in Volume: 97.2% Epithelialization: Small (1-33%) Tunneling: No Undermining:  No Wound Description Classification: Full Thickness Without Exposed Support Structures Wound Margin: Distinct, outline attached Exudate Amount: Large Exudate Type: Serous Exudate Color: amber Foul Odor After Cleansing: No Slough/Fibrino Yes Wound Bed Granulation Amount: Medium (34-66%) Exposed Structure Granulation Quality: Red, Pink Fascia Exposed: No Necrotic Amount: Medium (34-66%) Fat Layer (Subcutaneous Tissue) Exposed: Yes Necrotic Quality: Adherent Slough Tendon Exposed: No Muscle Exposed: No Joint Exposed: No Bone Exposed: No Periwound Skin Texture Texture Color No Abnormalities Noted: No No Abnormalities Noted: No Callus: No Atrophie Blanche: No Crepitus: No Cyanosis: No Excoriation: No Ecchymosis: No Induration: No Erythema: No Rash: No Hemosiderin Staining: Yes Scarring: No Mottled: No Pallor: No Moisture Rubor: No No Abnormalities Noted: No Dry / Scaly: No Temperature / Pain Maceration: No Temperature: No Abnormality Treatment Notes Wound #12 (Lower Leg) Wound Laterality: Left, Anterior Cleanser Soap and Water Discharge Instruction: May shower and wash wound with dial antibacterial soap and water prior to dressing change. Peri-Wound Care Sween Lotion (Moisturizing lotion) Discharge Instruction: Apply moisturizing lotion as directed Mcshea, Kj ([id number]) [id number] Page 11 of 16 Topical Primary Dressing Maxorb Extra CMC/Alginate Dressing, 4x4 (in/in) Discharge Instruction: Apply to wound bed as instructed Secondary Dressing ABD Pad, 8x10 Discharge Instruction: Apply over primary dressing as directed. Zetuvit Plus 4x8 in Discharge Instruction: Apply over primary dressing as directed. OptiLock Super Absorbent, 5x5.5 (in/in) Discharge Instruction: Apply directly to wound bed as directed Secured With Compression Wrap Urgo K2, (equivalent to a 4 layer) two layer compression system, regular Discharge Instruction:  Apply Jeryl Columbia  K2 as directed (alternative to 4 layer compression). Compression Stockings Add-Ons Electronic Signature(s) Signed: 03/25/2023 4:33:27 PM By: Samuella Bruin Entered By: Samuella Bruin on 03/25/2023 12:29:57 -------------------------------------------------------------------------------- Wound Assessment Details Patient Name: Date of Service: Violet, DA [name] 03/25/2023 2:15 PM Medical Record Number: [id number] Patient Account Number: [id number] Date of Birth/Sex: Treating RN: 06-02-1945 (77 y.o. Marlan Palau Primary Care Ronie Fleeger: [name] Other Clinician: Referring Krithik Mapel: Treating Allon Costlow/Extender: Valentina Gu Weeks in Treatment: 52 Wound Status Wound Number: 15 Primary Lymphedema Etiology: Wound Location: Left, Distal, Posterior Lower Leg Wound Open Wounding Event: Gradually Appeared Status: Date Acquired: 01/14/2023 Comorbid Anemia, Lymphedema, Deep Vein Thrombosis, Hypertension, Weeks Of Treatment: 5 History: Received Radiation Clustered Wound: No Photos Wound Measurements Length: (cm) 11 Width: (cm) 7.5 Depth: (cm) 0.1 Area: (cm) 64.795 Volume: (cm) 6.48 Rippeon, Demba ([id number]) % Reduction in Area: -633.3% % Reduction in Volume: -633% Epithelialization: Small (1-33%) Tunneling: No Undermining: No [id number].pdf Page 12 of 16 Wound Description Classification: Full Thickness Without Exposed Suppor Wound Margin: Distinct, outline attached Exudate Amount: Medium Exudate Type: Serosanguineous Exudate Color: red, brown t Structures Foul Odor After Cleansing: No Slough/Fibrino Yes Wound Bed Granulation Amount: Small (1-33%) Exposed Structure Granulation Quality: Pink Fascia Exposed: No Necrotic Amount: Large (67-100%) Fat Layer (Subcutaneous Tissue) Exposed: Yes Necrotic Quality: Adherent Slough Tendon Exposed: No Muscle Exposed: No Joint Exposed: No Bone Exposed: No Periwound  Skin Texture Texture Color No Abnormalities Noted: No No Abnormalities Noted: No Callus: No Atrophie Blanche: No Crepitus: No Cyanosis: No Excoriation: No Ecchymosis: No Induration: No Erythema: No Rash: No Hemosiderin Staining: Yes Scarring: No Mottled: No Pallor: No Moisture Rubor: No No Abnormalities Noted: No Dry / Scaly: No Maceration: No Treatment Notes Wound #15 (Lower Leg) Wound Laterality: Left, Posterior, Distal Cleanser Soap and Water Discharge Instruction: May shower and wash wound with dial antibacterial soap and water prior to dressing change. Peri-Wound Care Sween Lotion (Moisturizing lotion) Discharge Instruction: Apply moisturizing lotion as directed Topical Primary Dressing Maxorb Extra CMC/Alginate Dressing, 4x4 (in/in) Discharge Instruction: Apply to wound bed as instructed Secondary Dressing ABD Pad, 8x10 Discharge Instruction: Apply over primary dressing as directed. Zetuvit Plus 4x8 in Discharge Instruction: Apply over primary dressing as directed. OptiLock Super Absorbent, 5x5.5 (in/in) Discharge Instruction: Apply directly to wound bed as directed Secured With Compression Wrap Urgo K2, (equivalent to a 4 layer) two layer compression system, regular Discharge Instruction: Apply Urgo K2 as directed (alternative to 4 layer compression). Compression Stockings Add-Ons Electronic Signature(s) Signed: 03/25/2023 4:33:27 PM By: Samuella Bruin Entered By: Samuella Bruin on 03/25/2023 12:33:20 Streng, Dannielle [name] ([id number]) [id number] Page 13 of 16 -------------------------------------------------------------------------------- Wound Assessment Details Patient Name: Date of Service: Zerbe, DA [name] 03/25/2023 2:15 PM Medical Record Number: [id number] Patient Account Number: [id number] Date of Birth/Sex: Treating RN: 1946-03-12 (77 y.o. Marlan Palau Primary Care Sergey Ishler: [name] Other  Clinician: Referring Cissy Galbreath: Treating Janila Arrazola/Extender: Valentina Gu Weeks in Treatment: 52 Wound Status Wound Number: 6 Primary Lymphedema Etiology: Wound Location: Right, Medial Lower Leg Wound Open Wounding Event: Gradually Appeared Status: Date Acquired: 03/05/2022 Comorbid Anemia, Lymphedema, Deep Vein Thrombosis, Hypertension, Weeks Of Treatment: 52 History: Received Radiation Clustered Wound: Yes Photos Wound Measurements Length: (cm) Width: (cm) Depth: (cm) Clustered Quantity: Area: (cm) Volume: (cm) 15 % Reduction in Area: -106% 25 % Reduction in Volume: -106% 0.1 Epithelialization: Small (1-33%) 1 Tunneling: No 294.524 Undermining: No 29.452 Wound Description Classification: Full Thickness Without Exposed Sup Wound Margin: Distinct, outline attached Exudate Amount:  Large Exudate Type: Serous Exudate Color: amber port Structures Foul Odor After Cleansing: No Slough/Fibrino Yes Wound Bed Granulation Amount: Small (1-33%) Exposed Structure Granulation Quality: Pink Fascia Exposed: No Necrotic Amount: Large (67-100%) Fat Layer (Subcutaneous Tissue) Exposed: Yes Tendon Exposed: No Muscle Exposed: No Joint Exposed: No Bone Exposed: No Periwound Skin Texture Texture Color No Abnormalities Noted: No No Abnormalities Noted: No Callus: No Atrophie Blanche: No Crepitus: No Cyanosis: No Excoriation: No Ecchymosis: No Induration: No Erythema: No Rash: No Hemosiderin Staining: Yes Scarring: Yes Mottled: No Pallor: No Moisture Rubor: No No Abnormalities Noted: No Dry / Scaly: No Temperature / Pain Maceration: No Temperature: No Abnormality Duff, Koy ([id number]) [id number].pdf Page 14 of 16 Treatment Notes Wound #6 (Lower Leg) Wound Laterality: Right, Medial Cleanser Soap and Water Discharge Instruction: May shower and wash wound with dial antibacterial soap and water prior to dressing  change. Peri-Wound Care Sween Lotion (Moisturizing lotion) Discharge Instruction: Apply moisturizing lotion as directed Topical Primary Dressing Maxorb Extra CMC/Alginate Dressing, 4x4 (in/in) Discharge Instruction: Apply to wound bed as instructed Secondary Dressing ABD Pad, 8x10 Discharge Instruction: Apply over primary dressing as directed. Zetuvit Plus 4x8 in Discharge Instruction: Apply over primary dressing as directed. OptiLock Super Absorbent, 5x5.5 (in/in) Discharge Instruction: Apply directly to wound bed as directed Secured With Compression Wrap Urgo K2, (equivalent to a 4 layer) two layer compression system, regular Discharge Instruction: Apply Urgo K2 as directed (alternative to 4 layer compression). Compression Stockings Add-Ons Electronic Signature(s) Signed: 03/25/2023 4:33:27 PM By: Samuella Bruin Entered By: Samuella Bruin on 03/25/2023 12:37:01 -------------------------------------------------------------------------------- Wound Assessment Details Patient Name: Date of Service: Myricks, DA [name] 03/25/2023 2:15 PM Medical Record Number: [id number] Patient Account Number: [id number] Date of Birth/Sex: Treating RN: 1946/01/27 (77 y.o. Marlan Palau Primary Care Maigan Bittinger: [name] Other Clinician: Referring Laverda Stribling: Treating Cordarryl Monrreal/Extender: Valentina Gu Weeks in Treatment: 52 Wound Status Wound Number: 7 Primary Lymphedema Etiology: Wound Location: Left, Medial Lower Leg Wound Open Wounding Event: Gradually Appeared Status: Date Acquired: 03/05/2022 Comorbid Anemia, Lymphedema, Deep Vein Thrombosis, Hypertension, Weeks Of Treatment: 52 History: Received Radiation Clustered Wound: Yes Photos Mccranie, Dannielle [name] ([id number]) [id number] Page 15 of 16 Wound Measurements Length: (cm) Width: (cm) Depth: (cm) Clustered Quantity: Area: (cm) Volume: (cm) 6 % Reduction in Area: -60% 16 %  Reduction in Volume: -60% 0.1 Epithelialization: Small (1-33%) 1 Tunneling: No 75.398 Undermining: No 7.54 Wound Description Classification: Full Thickness Without Exposed Sup Wound Margin: Distinct, outline attached Exudate Amount: Large Exudate Type: Serous Exudate Color: amber port Structures Foul Odor After Cleansing: No Slough/Fibrino Yes Wound Bed Granulation Amount: Small (1-33%) Exposed Structure Granulation Quality: Red Fascia Exposed: No Necrotic Amount: Large (67-100%) Fat Layer (Subcutaneous Tissue) Exposed: Yes Necrotic Quality: Eschar Tendon Exposed: No Muscle Exposed: No Joint Exposed: No Bone Exposed: No Periwound Skin Texture Texture Color No Abnormalities Noted: No No Abnormalities Noted: No Callus: No Atrophie Blanche: No Crepitus: No Cyanosis: No Excoriation: No Ecchymosis: No Induration: No Erythema: No Rash: No Hemosiderin Staining: Yes Scarring: Yes Mottled: No Pallor: No Moisture Rubor: No No Abnormalities Noted: No Dry / Scaly: No Temperature / Pain Maceration: No Temperature: No Abnormality Treatment Notes Wound #7 (Lower Leg) Wound Laterality: Left, Medial Cleanser Soap and Water Discharge Instruction: May shower and wash wound with dial antibacterial soap and water prior to dressing change. Peri-Wound Care Sween Lotion (Moisturizing lotion) Discharge Instruction: Apply moisturizing lotion as directed Topical Primary Dressing Maxorb Extra CMC/Alginate Dressing, 4x4 (in/in) Discharge Instruction: Apply to wound bed  as instructed Secondary Dressing ABD Pad, 8x10 Discharge Instruction: Apply over primary dressing as directed. Zetuvit Plus 4x8 in Discharge Instruction: Apply over primary dressing as directed. Pinkstaff, Dannielle [name] ([id number]) [id number] Page 16 of 16 OptiLock Super Absorbent, 5x5.5 (in/in) Discharge Instruction: Apply directly to wound bed as directed Secured With Compression Wrap Urgo  K2, (equivalent to a 4 layer) two layer compression system, regular Discharge Instruction: Apply Urgo K2 as directed (alternative to 4 layer compression). Compression Stockings Add-Ons Electronic Signature(s) Signed: 03/25/2023 4:33:27 PM By: Samuella Bruin Entered By: Samuella Bruin on 03/25/2023 12:37:01 -------------------------------------------------------------------------------- Vitals Details Patient Name: Date of Service: Urbanczyk, DA [name] 03/25/2023 2:15 PM Medical Record Number: [id number] Patient Account Number: [id number] Date of Birth/Sex: Treating RN: [birth year] (77 y.o. M) Primary Care Ethon Wymer: [name] Other Clinician: Referring Kriston Mckinnie: Treating Jaley Yan/Extender: Valentina Gu Weeks in Treatment: 77 Vital Signs Time Taken: 03:08 Temperature (F): 98.0 Height (in): 74 Pulse (bpm): 111 Weight (lbs): 250 Respiratory Rate (breaths/min): 18 Body Mass Index (BMI): 32.1 Blood Pressure (mmHg): 191/79 Reference Range: 80 - 120 mg / dl Electronic Signature(s) Signed: 03/25/2023 3:52:08 PM By: Dayton Scrape Entered By: Dayton Scrape on 03/25/2023 12:09:48

## 2023-03-26 NOTE — Progress Notes (Signed)
Flitton, [name] [id number] Page 1 of 13 Visit Report for 03/25/2023 HPI Details Patient Name: Date of Service: [name], [name] 03/25/2023 2:15 PM Medical Record Number: [id number] Patient Account Number: [id number] Date of Birth/Sex: Treating RN: Nov 16, 1945 (77 y.o. M) Primary Care Provider: [name] Other Clinician: Referring Provider: Treating Provider/Extender: Valentina Gu Weeks in Treatment: 68 History of Present Illness HPI Description: ADMISSION 02/23/2020 Patient is a 77 year old man who lives in [place] who arrives accompanied by his wife. He has a history of chronic lymphedema and venous insufficiency in his bilateral lower legs which may have something to do that with having a history of DVT as well as being treated for prostate cancer. In any case he recently got compression pumps at home but compliance has been an issue here. He has compression stockings however they are probably not sufficient enough to control swelling. They tell us that things deteriorated for him in late August he was admitted to [facility] for 7 days. This was with cellulitis I think of his bilateral lower legs. Discharge he was noted to have wounds on his bilateral lower legs. He was discharged on Bactrim. They tried to get him home health through [facility] of course they declined him. His wife is been wrapping these applying some form of silver foam dressing. He has a history of wounds before although nothing that would not heal with basic home topical dressings. He has 2 areas on the left medial, left anterior and left lateral and a smaller area on the right medial. All of these have considerable depth. Past medical history includes iron deficiency anemia, lymphedema followed by the rehab center at [facility] with lymphedema wraps I believe, DVT on chronic anticoagulation, prostate cancer, chronic venous  insufficiency, hypertension. As mentioned he has compression pumps but does not use them. ABIs in our clinic were noncompressible bilaterally 10/14; patient with severe bilateral lymphedema right greater than left. He came in with bilateral lower extremity wounds left greater than right. Even though the right side has more of the edema most of the wounds here almost closed on the right medial. He has 3 remaining wounds on the left We have been using silver alginate under 4-layer compression I have been trying to get him to be compliant with his external compression pumps 10/21; patient with 3 small wounds on the left leg and 1 on the right medial in the setting of severe lymphedema and chronic venous insufficiency. We have been using silver alginate under 4-layer compression he is using his external compression pumps twice a day 11/4; ARTERIAL STUDIES on the right show an ABI of 1.02 TBI of 0.858 with biphasic waveforms on the left 0.98 with a TBI of 0.55 and biphasic waveforms. Does not look like he has significant arterial disease. We are treating him for lymphedema he has compression pumps. He has punched-out areas on the left anterior left lateral and right medial lower extremities 11/11; after we obtained his arterial studies I put him in 4 layer compression. He is using his compression pumps probably once a day although I have asked him to do twice. Primary dressing to the wound is silver collagen he has severe lymphedema likely secondary to chronic venous insufficiency. Wounds on the left lateral, left medial and left anterior and a small area on the right medial 12/2; the area on the right anterior lower leg has healed. We initially thought that the area medially had healed as  well however when her discharge nurse came in she detected fluid in the wound simply opened up. This is actually worse than I remember this pain. The area on the left lateral potentially slightly smaller He is also  complaining about pain in his left hand he says that this is actually been getting some better he has been using topical creams on this. She asked that I look at this 12/9 after last weeks issues we have 2 wounds one on the right medial lower leg and 1 on the left lateral. Both of these are in the same condition. I think because of thickened skin secondary to chronic lymphedema these wounds actually have depth of almost 0.8 cm. 12/16; the patient has 2 small but deep wounds one on the right medial and one on the left lateral. The right medial is actually the worst of these. He arrives in clinic today with absolutely terrible edema in the right leg apparently his 4-layer wrap fell down to just above his ankle he did not think about this he is apparently been continuing to use his compression pump twice a day. The left leg looks a lot better. 05/09/2020 upon evaluation today patient appears to be doing decently well in regard to his wounds. Everything is measuring smaller the right leg still has a little bit deeper wound in the left seems to be almost completely healed in my opinion I am very pleased in general with how things are progressing. He has a 4- layer compression wrap we have been using endoform today we will probably have to use collagen just based on the fact that we do not have endoform it is on order. 1/6; the patient's wound on the left lateral lower leg has healed. Still has 1 on the right medial. 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 Using compression pumps at home twice a day. 1/13; left lateral lower leg is still healed. He has a deep punched out rectangular shaped wound on the right medial calf. Looking down at this it appears that he is attempting to epithelialize around the edges of the wound and on the base as well. His edema is reasonably well controlled we have been using collagen with absolutely no effect 1/20; left lateral lower leg remains closed he has  extremitease stockings. The area on the right medial calf I aggressively debrided last week measures larger but the surface looks better. We have been using Hydrofera Blue. We ran Oasis through his insurance but we have not seen the results of this 1/27; left lower leg wound with chronic venous insufficiency and secondary lymphedema. I did aggressive debridement on this last week the wound seems to have come in healthy looking surface using Hydrofera Blue. He was denied for Oasis 2/3; small divot in the right medial lower leg. Under illumination the walls of this divot are epithelialized however the base has slough which I removed with a curette we have been using Hydrofera Blue 2/10 small divot on the right medial lower leg pinpoint illumination at the base of this cone-shaped wound. We have been using Hydrofera Blue but I will switch to calcium alginate this week 2/17; the small divot on the right medial lower leg is fully epithelialized. There is no visible open area under illumination. He has his own stocking for the right leg similar to the one he has been wearing on the left. Mcleroy, [name] [id number] Page 2 of 13 03/26/2022; READMISSION This is a now 77 year old man that  we had in the clinic from 02/23/2020 through 07/05/2020. At that point he had bilateral lower extremity wounds left greater than right in the setting of severe lymphedema. He had already obtained compression pumps ordered for him I think from the wound care clinic in [facility] so I do not really have record of what he has been using. He claims to be using them once a day but there is a problem with the sleeve on the left leg. About 2 weeks ago he was hospitalized from 03/11/2022 through 03/14/2022 with diastolic congestive heart failure. 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 He was diuresed. Developed some prerenal azotemia  and he has not been taking any diuretics currently. He has not been putting stockings on his legs since he got out of hospital and still has his legs dependent for long periods. Past medical history history of prostate cancer treated with prostatectomy and radiation this was apparently about 8 years ago, history of DVT on chronic Coumadin, history of lymphedema was managed for a while at the clinic in [place]. History of inguinal hernia repair in September 22, hypertension, stage IIIb chronic renal failure ABIs today were noncompressible on the right 1.12 on the left 04-02-2022 upon evaluation today patient appears to be doing well currently in regard to his legs I do feel like both areas that are draining are actually much drier than they were in the picture last week although the left is drier than the right. He is tolerating the 4-layer compression wraps at this point he did contact the pump company and they are actually working on getting him a new compression sleeve for one of his legs which have previously popped and was not functioning properly. 04-23-2022 upon evaluation today patient appears to be doing well currently in regard to his wounds on the legs. I am actually very pleased with where things stand and I do feel like that we are headed in the right direction. Fortunately there is no sign of active infection locally or systemically at this time. 05-07-2022 upon evaluation today patient appears to be doing well currently in regard to his wounds in fact things are showing signs of improvement which is good news I do not see too much that actually appears to be open and I am very pleased in that regard. No fevers, chills, nausea, vomiting, or diarrhea. 05-21-2022 upon evaluation today patient appears to be doing somewhat poorly in regard to drainage of his lower extremities bilaterally. The right is greater than left as far as the weeping area. Nonetheless it seems to be getting worse not  better. He actually has pitting edema which is at least 2+ to the thighs and I am concerned about the fact that he is may be fluid overloaded in general and that is the reason why we cannot get this under control. I know he is not using his pumps all the time because he actually told the nurse that he was either going to pump or he was going to use his fluid pills but not do both. For that reason I do think that he needs to be really doing both in order to get the fluid out as effectively as possible obviously with the 4-layer compression wraps were doing as much as we can from a compression standpoint but it is really not enough. He tells me that he elevates his leg is much as he can in between pumping and other  activity throughout the day. 05-28-2022 upon evaluation today patient appears to be doing better in regard to his wounds although the measurements may be a little bit larger this is a very difficult wound to heal it is very indistinct in a lot of areas. Nonetheless there is can be some need for sharp debridement in regard to both medial and lateral legs. Fortunately I see no signs of active infection locally nor systemically at this time. No fevers, chills, nausea, vomiting, or diarrhea. 06-04-2022 upon evaluation today patient appears to be doing poorly in general in regard to the wounds on his legs. He still continues to have a tremendous amount of fluid not just in the lower portion of his leg but to be honest his thigh where he has 2-3+ pitting edema in the thigh as well. Unfortunately I do not know that we will be able to get this healed effectively and keep it healed on the lower extremities unless he gets the overall fluid situation taking and under control. Fortunately I do not see any signs of infection locally nor systemically which is great news. He just seems to be very fluid overloaded. 06-11-2022 upon evaluation today patient presents for follow-up concerning his bilateral lower  extremity lymphedema secondary to chronic venous insufficiency. He has been tolerating the dressing changes with the compression wraps without complication. Fortunately I do not see any evidence of infection at this time which is great news. No fevers, chills, nausea, vomiting, or diarrhea. 06-18-2022 upon evaluation today patient appears to be doing well currently in regard to his wounds as far as not looking like they are terribly infected but nonetheless I am concerned about a subacute infection secondary to the fact that he continues to have spreading despite the compression therapy. We actually did do an Unna boot on him last week this is actually the first wrap that actually stayed up everything else has been sliding down quite significantly. Fortunately there does not appear to be any signs of infection systemically at this time. With that being said I do believe that locally there seems to be an issue going on here and again I Ernie Hew do a PCR culture to see what that shows also think that I am going to put him on a broad-spectrum antibiotic, doxycycline to see how that will help as well. He does tell me that coming into the clinic today that he was feeling short of breath like "he was about to have a heart attack" because he was having such a hard time breathing. He says that he told this to Dr. Jodelle Green his cardiologist as well when he was evaluated in the past 1 to 2 weeks. 07-02-2022 upon evaluation today patient appears to be doing poorly currently in regard to his wound. He has been tolerating the dressing changes. Unfortunately he has not had any compression wraps on for the past week because he was unable to make it in for his appointment last week. With that being said he has a significant amount of drainage he tells me has been using his pumps but despite this in the pumps he still has been draining quite a bit. The drainage is also somewhat purulent unfortunately. We did attempt to get in  touch with his cardiologist last week unfortunately we were unable to get up with him I did advise that the patient needs to get in touch with him upon leaving today in order to make sure they know he is on the new antibiotics I  am going to send him this will be Levaquin and Augmentin. 07-09-2022 upon evaluation today patient appears to be doing about the same in regard to his legs he may have just a slight amount of improvement with regard to the drainage probably Keystone topical antibiotics are helping in this regard to some degree. Fortunately there does not appear to be any signs of active infection systemically which is great news. No fevers, chills, nausea, vomiting, or diarrhea. 07-16-2022 upon evaluation today patient appears to be doing well currently in regard to his wound. He has been tolerating the dressing changes without complication. Fortunately there does not appear to be any signs of active infection locally nor systemically at this time. With that being said he cannot keep the wraps up he tells me on the left side he had to cut this down because it got too tight. He has been using his pumps but he is on the right side the wrap actually straight down causing some pushing around the central part of his leg just below the calf I think this is a bigger risk for him that help at this point. I think that we may need to try something different he should be getting his compression socks shortly he tells me they were ordered last Thursday. 3/6; ; this is a patient who lives in [place]. He has severe bilateral lymphedema. He has compression pumps, we have been using kerlix Ace wrap Keystone. He is changing the dressing. We do not have home health. 08-06-2022 upon evaluation today patient appears to be doing a little better in regard to his wounds in general at this point. Fortunately there does not appear to be any signs of active infection locally nor systemically at this time which is great  news and overall I am extremely pleased with where we stand today. 08-13-2022 upon evaluation today patient appears to actually be doing significantly better compared to last week. He actually did go to the hospital I told him that he needed to when he left here and he actually did go. With that being said they actually ended up admitting him he was having shortness of breath and I thought it might be related to congestive heart failure turns out he actually had a pulmonary embolism. Subsequently they were able to get him off of the Coumadin switching over to Eliquis to get things stabilized in that regard they also had them wrapped and got his swelling under control on his legs he actually looks much better pretty much across the board at this point. I am very pleased in that regard. With that being said I am very happy that he finally went that could have been a very dangerous situation. 08-20-2022 upon evaluation today patient appears to be doing well currently in regard to his wound. Has been tolerating the dressing changes without complication. Fortunately there does not appear to be any signs of active infection locally nor systemically at this time. I think his legs are doing better there is some need for sharp debridement today. Desta, [name] [id number] Page 3 of 13 08-27-2022 upon evaluation patient is actually making excellent progress. I am actually very pleased with where he stands and I think that he is moving in the right direction. In general I think that we are looking pretty good at the moment. 09-03-2022 upon evaluation today patient appears to be doing well currently in regard to his wound. He is actually tolerating dressing changes on the left and right leg  without complication. Fortunately I do not see any need for debridement of the left leg the right leg I think we probably do some need to perform some debridement here. 09-10-2022 upon  evaluation today patient's wounds actually showed signs of improvement in both legs I do not see much is going require debridement today which was great news. Fortunately I do not see any evidence of infection which I think is also excellent he seems to be using his pumps and doing everything right I am happy about how this is progressing at this point. 09-17-2022 upon evaluation today patient appears to be doing decently well in regard to his wounds. He has been tolerating the dressing changes without complication. Fortunately there does not appear to be any signs of active infection at this time which is good news. 09-24-2022 upon evaluation today patient appears to be doing well currently in regard to his wounds. He has been tolerating the dressing changes without complication. Fortunately there does not appear to be any signs of active infection locally nor systemically which is great news. No fevers, chills, nausea, vomiting, or diarrhea. 10-08-2022 upon evaluation today patient appears to be doing excellent currently in regard to his wound. He has been tolerating the dressing changes without complication though it does not sound like he has been using his compression wraps for a bit here. He does think he was doing better with the [facility] topical antibiotics we can definitely go back to using that but I think the biggest issue here is that his swelling is just very out of control and needs to be under control. I discussed that with him today. 10-15-2022 upon evaluation today patient appears to be doing well currently in regard to his wounds. He is actually making some progress which is good news. Fortunately I do not see any evidence of active infection locally nor systemically which is great news as well. No fevers, chills, nausea, vomiting, or diarrhea. He does have a callused area on the plantar aspect of his left foot which is actually causing some pain and he wonders if I can trim this down for  him. 10-22-2022 upon evaluation today patient seems to be making progress. He is actually doing quite well and very pleased in that regard. I do not see any signs of active infection at this time. 11-05-2022 upon evaluation patient appears to be making progress although slowly towards closure. He seems to be doing well with regard to his legs were still using the [facility] topical antibiotics and he seems to be doing quite well. He is going require some sharp debridement today. 11-19-2022 upon evaluation today patient unfortunately appears to be extremely swollen at this point. He tells me that he ran out of supplies he also tells me his leg started leaking more because of not having supplies he was unable to wear his compression wraps that is the juxta fit compression wraps. Therefore his legs are extremely swollen much larger than normal and do not appear to be doing well at all today. He is going require some debridement I also think he is going require [place] to perform compression wrapping today. 11-26-2022 upon evaluation today patient appears to be doing well currently in regard to his legs as far as infection is concerned I see nothing that appears to be infected. Fortunately I do not see any signs of active infection systemically either which is also good news. With that being said he still is extremely swollen as far as his legs  are concerned. I do not see any signs of overall worsening but also do not see any signs of significant improvement which is the major issue here. 12-03-2022 upon evaluation today patient appears to be doing poorly still in regard to his legs the apparently has been taken off the wraps and not leaving the week by week. With that being said he has not had really the dressings to put on the release that is running out and subsequently though he is using his wraps I am not sure that he has been keeping them on like he needs to. I explained to him I really wanted to have the wraps  that I put on him left on until he comes back to see me so that we can keep the compression under better control. He voiced understanding today he tells me that he was "confused about that". Nonetheless based on what I am seeing right now also think that he is up on his feet too much I think he needs to get the feet elevated. 7/24; this is a patient with severe bilateral stage III lymphedema. On the right lower leg medially is a large area of macerated denuded skin was too small deeper areas in this. On the left leg he is 2 areas that are a little more standard in terms of wounds. Massive lymphedema bilaterally. He has compression pumps at home which he uses for 1 hour twice a day 12-17-2022 upon evaluation today patient's legs though a little bit smaller still continue to have significant issues with weeping here this just does not dry. I really think he needs to be changing this daily and this means we will probably have to try to go back to the Velcro wraps and give this a shot. I think may be going to the Velcro wraps, changing the dressing to a superabsorber like XtraSorb or the like, and have him elevate his legs and do his pumps 3 times per day is probably going to be the way to go to try to see if we can make some improvement here. 12-24-2022 upon evaluation today patient unfortunately is still struggling to get anything under control. We have been extremely aggressive and trying to: Control his swelling everything we do however seems to be met with resistance. He tells me that he is pumping 3 times per day with his lymphedema pumps. He also tells me that he is try to elevate his legs is much as possible and that subsequently he has been keeping his compression wraps on. He goes back and forth between saying that he can keep our wraps on and not but right now I really do not see that he is doing well with the juxta fit is at least not from the standpoint of improving the overall appearance I think  it does to some degree help maintain but we are still struggling here. I have voiced a concern to the patient that despite everything you are doing I am still not seeing a lot of improvement here and to be honest I am not sure what is can take get this under control he may have to go into the hospital for admission. 12-31-2022 upon evaluation today patient appears to be doing well currently in regard to his wounds. They seem a little better although he has several pustules around the legs in general but has been concerned about infection. I am actually going to go ahead and see about getting an antibiotic sent into the pharmacy today  although I am going to obtain a PCR culture as well. 01-07-2023 upon evaluation today patient appears to be doing well currently in regard to his wound. Has been tolerating the dressing changes without complication. He actually seems to be doing much better. He did have a PCR culture which showed Serratia as well and the prominent organisms here. Levaquin was actually a good option to treat the Serratia along with the other organisms that were problematic including E. coli. Nonetheless I think coupled with the topical Keystone antibiotics and the alginate he is really doing quite well today. 8/28; patient with severe bilateral lymphedema he has wounds bilaterally on his lower legs but they look a lot better today. We are using silver alginate and Keystone with Urgo K2 lite wraps. He comes in today with his wounds looking quite good. He was prescribed Levaquin last time I think he will complete this in a few days. 01-21-2023 upon evaluation today patient appears to be doing worse in regard to his swelling took his wrap off he tells me he believes on Saturday. With that being said his leg is significantly swollen since that time and unfortunately does not appear to be doing nearly as good as it was last time I saw him 2 weeks ago or even last week for that matter. I am very  concerned about this. 01-28-2023 upon evaluation today patient appears to be doing poorly currently in regard to his legs. He has been doing everything he says that we have recommended, he continues to use his lymphedema pumps 3 times a day, he has been doubling up on his fluid pills, he has been elevating his legs, and he tells me that he also has kept the wraps on and in fact he did have them on the day when he came in. Again that for feels the compression side of things. Nonetheless with everything going on here we still have not been able to get this completely under control. He continues to have issues with significant swelling of lower extremities and this is not limited to just his lower left portion of his legs but his thighs are fairly large as well. With that being said I really feel like that we have reached the extent of what we have to offer here at the wound care center I am wondering if we can see about making a referral for second opinion to [facility] to see if there is anything they could do to help him out at this point. It is really not much further from his home to there as it is from his home to here. Both are about an hour distance. He lives in [place]. Messman, [name] [id number] Page 4 of 13 02-04-2023 upon evaluation today patient appears to be doing much better actually compared to last week's evaluation. This is actually significant improvement and overall I do not know exactly what he did different other than he tells me that he "has been taking his fluid pills every day instead of every other day. Obviously this is needed. I was under the understanding previously that he was taking this every day but I think he may also not been pumping every day like he was telling me but I cannot be sure this either way he does seem to be doing what he supposed to do at this point. 02-11-2023 upon evaluation today patient unfortunately  appears to be about as bad today as he appeared good last week. I am not sure  what happened other than the fact he did take his wrap off the right leg he tells me it got so tight that he could not feel his toes they are getting very numb. Subsequently he tells me that it was difficulty with driving therefore he took it off. With that being said I do believe that he may need to go to the ER for evaluation and treatment I feel he may need to be admitted to get some of the fluid off of his legs. I think that he is volume overloaded and at this point I do not think there is much working to be able to do to manage this outpatient we have tried pretty much everything that I am going to do. 02-18-23 upon evaluation today patient appears to be doing pretty well currently compared to last week this is actually better today. He is less weeping and the legs are actually significantly smaller. He tells me has been trying to stay in bed with his legs elevated more. 02-25-2023 upon evaluation today patient appears to be doing well currently in regard to his legs in general compared to where we have been. Fortunately I do not see any signs of active infection locally or systemically which is great news and in general I do believe that we are making good headway here towards complete closure. I am hopeful that he will continue to show signs of improvement as we progress going forward. He does seem to have been taking his fluid pills as well as elevating his legs and using lymphedema pumps on a regular basis. He is trying to stay out of the hospital. 03-04-2023 upon evaluation today patient appears to be doing well currently in regard to his wound. He has been tolerating the dressing changes without complication. Fortunately there does not appear to be any signs of active infection at this time. 03-11-2023 upon evaluation today patient appears to be doing well currently in regard to his wounds. He has been tolerating the  dressing changes without complication. Fortunately I do not see any signs of active infection at this time which is great news. 11/6; patient missed his clinic appointment last week. He took his wraps off at some point. Did not take his diuretics because he was nauseated last week. He comes in with massive bilateral edema which is lymphedema and probably some degree of systemic fluid overload. Electronic Signature(s) Signed: 03/25/2023 4:54:50 PM By: Baltazar Najjar MD Entered By: Baltazar Najjar on 03/25/2023 13:31:17 -------------------------------------------------------------------------------- Physical Exam Details Patient Name: Date of Service: Wilk, [name] 03/25/2023 2:15 PM Medical Record Number: [id number] Patient Account Number: [id number] Date of Birth/Sex: Treating RN: 1945/08/11 (77 y.o. M) Primary Care Provider: [name] Other Clinician: Referring Provider: Treating Provider/Extender: Valentina Gu Weeks in Treatment: 10 Constitutional Patient is hypertensive.. Pulse regular and within target range for patient.Marland Kitchen Respirations regular, non-labored and within target range.. Temperature is normal and within the target range for the patient.Marland Kitchen Appears in no distress. Notes Wound exam; much more substantial breakdown on the right medially extending posteriorly this is almost circumferential. He has a more localized area on the left medial calf. All of these covered in a nonviable necrotic surface. Because of the extreme amount of edema in both legs lymphedema, no debridement was contemplated. He has pitting edema in the right greater than left thigh. Elevated jugular venous pressure. His lungs are clear. Pansystolic murmur sounding like tricuspid regurg . Respiratory exam is clear Electronic Signature(s) Signed: 03/25/2023 4:54:50  PM By: Baltazar Najjar MD Entered By: Baltazar Najjar on 03/25/2023  13:34:43 -------------------------------------------------------------------------------- Physician Orders Details Patient Name: Date of Service: Loiseau, [name] 03/25/2023 2:15 PM Medical Record Number: [id number] Patient Account Number: [id number] Date of Birth/Sex: Treating RN: Apr 27, 1946 (77 y.o. [name] Primary Care Provider: [name] Other Clinician: Referring Provider: Treating Provider/Extender: Valentina Gu Yero, [name] ([id number]) [id number] Page 5 of 13 Weeks in Treatment: 52 The following information was scribed by: [name] The information was scribed for: Baltazar Najjar Verbal / Phone Orders: No Diagnosis Coding ICD-10 Coding Code Description I87.333 Chronic venous hypertension (idiopathic) with ulcer and inflammation of bilateral lower extremity I89.0 Lymphedema, not elsewhere classified 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.818 Non-pressure chronic ulcer of other part of right lower leg with other specified severity L84 Corns and callosities Follow-up Appointments ppointment in 1 week. - Wednesday****extra time 60 minutes**** 04/01/2023 3pm Return A ppointment in 2 weeks. - Wednesday Dr. Leanord Hawking 04/08/2023 (front office to schedule) Return A Return appointment in 1 month. - Please see front desk to schedule appointment Nurse Visit: - 04/15/2023 ******60 minutes***** Anesthetic (In clinic) Topical Lidocaine 5% applied to wound bed Bathing/ Shower/ Hygiene May shower with protection but do not get wound dressing(s) wet. Protect dressing(s) with water repellant cover (for example, large plastic bag) or a cast cover and may then take shower. Off-Loading Other: - When sitting please keep your feet up, keep legs at Heart Level or above. Use pillows behind calf to for comfort. Additional Orders / Instructions Follow Nutritious Diet -  Try and increase protein intake. Goal of protein intake is 60g-100g. Wound Treatment Wound #12 - Lower Leg Wound Laterality: Left, Anterior Cleanser: Soap and Water 1 x Per Week/30 Days Discharge Instructions: May shower and wash wound with dial antibacterial soap and water prior to dressing change. Peri-Wound Care: Sween Lotion (Moisturizing lotion) 1 x Per Week/30 Days Discharge Instructions: Apply moisturizing lotion as directed Prim Dressing: Maxorb Extra CMC/Alginate Dressing, 4x4 (in/in) 1 x Per Week/30 Days ary Discharge Instructions: Apply to wound bed as instructed Secondary Dressing: ABD Pad, 8x10 1 x Per Week/30 Days Discharge Instructions: Apply over primary dressing as directed. Secondary Dressing: Zetuvit Plus 4x8 in 1 x Per Week/30 Days Discharge Instructions: Apply over primary dressing as directed. Secondary Dressing: OptiLock Super Absorbent, 5x5.5 (in/in) (Generic) 1 x Per Week/30 Days Discharge Instructions: Apply directly to wound bed as directed Compression Wrap: Urgo K2, (equivalent to a 4 layer) two layer compression system, regular 1 x Per Week/30 Days Discharge Instructions: Apply Urgo K2 as directed (alternative to 4 layer compression). Wound #15 - Lower Leg Wound Laterality: Left, Posterior, Distal Cleanser: Soap and Water 1 x Per Week/30 Days Discharge Instructions: May shower and wash wound with dial antibacterial soap and water prior to dressing change. Peri-Wound Care: Sween Lotion (Moisturizing lotion) 1 x Per Week/30 Days Discharge Instructions: Apply moisturizing lotion as directed Prim Dressing: Maxorb Extra CMC/Alginate Dressing, 4x4 (in/in) 1 x Per Week/30 Days ary Discharge Instructions: Apply to wound bed as instructed Secondary Dressing: ABD Pad, 8x10 1 x Per Week/30 Days Discharge Instructions: Apply over primary dressing as directed. Secondary Dressing: Zetuvit Plus 4x8 in 1 x Per Week/30 Days Discharge Instructions: Apply over primary dressing  as directed. Secondary Dressing: OptiLock Super Absorbent, 5x5.5 (in/in) (Generic) 1 x Per Week/30 Days Discharge Instructions: Apply directly to wound bed as directed Ghazarian, Jehad ([id number]) [id number] Page 6  of 13 Compression Wrap: Urgo K2, (equivalent to a 4 layer) two layer compression system, regular 1 x Per Week/30 Days Discharge Instructions: Apply Urgo K2 as directed (alternative to 4 layer compression). Wound #6 - Lower Leg Wound Laterality: Right, Medial Cleanser: Soap and Water 1 x Per Week/30 Days Discharge Instructions: May shower and wash wound with dial antibacterial soap and water prior to dressing change. Peri-Wound Care: Sween Lotion (Moisturizing lotion) 1 x Per Week/30 Days Discharge Instructions: Apply moisturizing lotion as directed Prim Dressing: Maxorb Extra CMC/Alginate Dressing, 4x4 (in/in) 1 x Per Week/30 Days ary Discharge Instructions: Apply to wound bed as instructed Secondary Dressing: ABD Pad, 8x10 1 x Per Week/30 Days Discharge Instructions: Apply over primary dressing as directed. Secondary Dressing: Zetuvit Plus 4x8 in 1 x Per Week/30 Days Discharge Instructions: Apply over primary dressing as directed. Secondary Dressing: OptiLock Super Absorbent, 5x5.5 (in/in) (Generic) 1 x Per Week/30 Days Discharge Instructions: Apply directly to wound bed as directed Compression Wrap: Urgo K2, (equivalent to a 4 layer) two layer compression system, regular 1 x Per Week/30 Days Discharge Instructions: Apply Urgo K2 as directed (alternative to 4 layer compression). Wound #7 - Lower Leg Wound Laterality: Left, Medial Cleanser: Soap and Water 1 x Per Week/30 Days Discharge Instructions: May shower and wash wound with dial antibacterial soap and water prior to dressing change. Peri-Wound Care: Sween Lotion (Moisturizing lotion) 1 x Per Week/30 Days Discharge Instructions: Apply moisturizing lotion as directed Prim Dressing: Maxorb Extra  CMC/Alginate Dressing, 4x4 (in/in) 1 x Per Week/30 Days ary Discharge Instructions: Apply to wound bed as instructed Secondary Dressing: ABD Pad, 8x10 1 x Per Week/30 Days Discharge Instructions: Apply over primary dressing as directed. Secondary Dressing: Zetuvit Plus 4x8 in 1 x Per Week/30 Days Discharge Instructions: Apply over primary dressing as directed. Secondary Dressing: OptiLock Super Absorbent, 5x5.5 (in/in) (Generic) 1 x Per Week/30 Days Discharge Instructions: Apply directly to wound bed as directed Compression Wrap: Urgo K2, (equivalent to a 4 layer) two layer compression system, regular 1 x Per Week/30 Days Discharge Instructions: Apply Urgo K2 as directed (alternative to 4 layer compression). Electronic Signature(s) Signed: 03/25/2023 4:54:50 PM By: Baltazar Najjar MD Signed: 03/25/2023 6:03:26 PM By: [name] RN, [name] Entered By: [name] on 03/25/2023 12:53:41 -------------------------------------------------------------------------------- Problem List Details Patient Name: Date of Service: Stirewalt, [name] 03/25/2023 2:15 PM Medical Record Number: [id number] Patient Account Number: [id number] Date of Birth/Sex: Treating RN: Oct 10, 1945 (77 y.o. [name] Primary Care Provider: [name] Other Clinician: Referring Provider: Treating Provider/Extender: Valentina Gu Weeks in Treatment: 72 Active Problems ICD-10 Michie, [name] ([id number]) [id number] Page 7 of 13 Encounter Code Description Active Date MDM Diagnosis I87.333 Chronic venous hypertension (idiopathic) with ulcer and inflammation of 06/11/2022 No Yes bilateral lower extremity I89.0 Lymphedema, not elsewhere classified 03/26/2022 No Yes L97.828 Non-pressure chronic ulcer of other part of left lower leg with other specified 03/26/2022 No Yes severity L97.818 Non-pressure chronic ulcer of other part of right lower leg with other specified  03/26/2022 No Yes severity L84 Corns and callosities 10/15/2022 No Yes Inactive Problems Resolved Problems Electronic Signature(s) Signed: 03/25/2023 4:54:50 PM By: Baltazar Najjar MD Entered By: Baltazar Najjar on 03/25/2023 13:30:01 -------------------------------------------------------------------------------- Progress Note Details Patient Name: Date of Service: Creasman, [name] 03/25/2023 2:15 PM Medical Record Number: [id number] Patient Account Number: [id number] Date of Birth/Sex: Treating RN: December 13, 1945 (77 y.o. M) Primary Care Provider: [name] Other Clinician: Referring Provider: Treating Provider/Extender: Christian Mate  S Weeks in Treatment: 52 Subjective History of Present Illness (HPI) ADMISSION 02/23/2020 Patient is a 77 year old man who lives in [place] who arrives accompanied by his wife. He has a history of chronic lymphedema and venous insufficiency in his bilateral lower legs which may have something to do that with having a history of DVT as well as being treated for prostate cancer. In any case he recently got compression pumps at home but compliance has been an issue here. He has compression stockings however they are probably not sufficient enough to control swelling. They tell us that things deteriorated for him in late August he was admitted to [facility] for 7 days. This was with cellulitis I think of his bilateral lower legs. Discharge he was noted to have wounds on his bilateral lower legs. He was discharged on Bactrim. They tried to get him home health through [facility] of course they declined him. His wife is been wrapping these applying some form of silver foam dressing. He has a history of wounds before although nothing that would not heal with basic home topical dressings. He has 2 areas on the left medial, left anterior and left lateral and a smaller area on the right medial. All of these have considerable  depth. Past medical history includes iron deficiency anemia, lymphedema followed by the rehab center at [facility] with lymphedema wraps I believe, DVT on chronic anticoagulation, prostate cancer, chronic venous insufficiency, hypertension. As mentioned he has compression pumps but does not use them. ABIs in our clinic were noncompressible bilaterally 10/14; patient with severe bilateral lymphedema right greater than left. He came in with bilateral lower extremity wounds left greater than right. Even though the right side has more of the edema most of the wounds here almost closed on the right medial. He has 3 remaining wounds on the left We have been using silver alginate under 4-layer compression I have been trying to get him to be compliant with his external compression pumps 10/21; patient with 3 small wounds on the left leg and 1 on the right medial in the setting of severe lymphedema and chronic venous insufficiency. We have been using silver alginate under 4-layer compression he is using his external compression pumps twice a day 11/4; Baranowski, Ansley ([id number]) [phone].pdf Page 8 of 13 ARTERIAL STUDIES on the right show an ABI of 1.02 TBI of 0.858 with biphasic waveforms on the left 0.98 with a TBI of 0.55 and biphasic waveforms. Does not look like he has significant arterial disease. We are treating him for lymphedema he has compression pumps. He has punched-out areas on the left anterior left lateral and right medial lower extremities 11/11; after we obtained his arterial studies I put him in 4 layer compression. He is using his compression pumps probably once a day although I have asked him to do twice. Primary dressing to the wound is silver collagen he has severe lymphedema likely secondary to chronic venous insufficiency. Wounds on the left lateral, left medial and left anterior and a small area on the right medial 12/2; the area on the right anterior lower  leg has healed. We initially thought that the area medially had healed as well however when her discharge nurse came in she detected fluid in the wound simply opened up. This is actually worse than I remember this pain. The area on the left lateral potentially slightly smaller He is also complaining about pain in his left hand he says that this  is actually been getting some better he has been using topical creams on this. She asked that I look at this 12/9 after last weeks issues we have 2 wounds one on the right medial lower leg and 1 on the left lateral. Both of these are in the same condition. I think because of thickened skin secondary to chronic lymphedema these wounds actually have depth of almost 0.8 cm. 12/16; the patient has 2 small but deep wounds one on the right medial and one on the left lateral. The right medial is actually the worst of these. He arrives in clinic today with absolutely terrible edema in the right leg apparently his 4-layer wrap fell down to just above his ankle he did not think about this he is apparently been continuing to use his compression pump twice a day. The left leg looks a lot better. 05/09/2020 upon evaluation today patient appears to be doing decently well in regard to his wounds. Everything is measuring smaller the right leg still has a little bit deeper wound in the left seems to be almost completely healed in my opinion I am very pleased in general with how things are progressing. He has a 4- layer compression wrap we have been using endoform today we will probably have to use collagen just based on the fact that we do not have endoform it is on order. 1/6; the patient's wound on the left lateral lower leg has healed. Still has 1 on the right medial. 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 Using compression pumps at home twice a day. 1/13; left lateral lower leg is still healed. He has a deep punched out rectangular shaped wound on the  right medial calf. Looking down at this it appears that he is attempting to epithelialize around the edges of the wound and on the base as well. His edema is reasonably well controlled we have been using collagen with absolutely no effect 1/20; left lateral lower leg remains closed he has extremitease stockings. The area on the right medial calf I aggressively debrided last week measures larger but the surface looks better. We have been using Hydrofera Blue. We ran Oasis through his insurance but we have not seen the results of this 1/27; left lower leg wound with chronic venous insufficiency and secondary lymphedema. I did aggressive debridement on this last week the wound seems to have come in healthy looking surface using Hydrofera Blue. He was denied for Oasis 2/3; small divot in the right medial lower leg. Under illumination the walls of this divot are epithelialized however the base has slough which I removed with a curette we have been using Hydrofera Blue 2/10 small divot on the right medial lower leg pinpoint illumination at the base of this cone-shaped wound. We have been using Hydrofera Blue but I will switch to calcium alginate this week 2/17; the small divot on the right medial lower leg is fully epithelialized. There is no visible open area under illumination. He has his own stocking for the right leg similar to the one he has been wearing on the left. 03/26/2022; READMISSION This is a now 77 year old man that we had in the clinic from 02/23/2020 through 07/05/2020. At that point he had bilateral lower extremity wounds left greater than right in the setting of severe lymphedema. He had already obtained compression pumps ordered for him I think from the wound care clinic in [facility] so I do not really have record of what he  has been using. He claims to be using them once a day but there is a problem with the sleeve on the left leg. About 2 weeks ago he was hospitalized from  03/11/2022 through 03/14/2022 with diastolic congestive heart failure. 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 He was diuresed. Developed some prerenal azotemia and he has not been taking any diuretics currently. He has not been putting stockings on his legs since he got out of hospital and still has his legs dependent for long periods. Past medical history history of prostate cancer treated with prostatectomy and radiation this was apparently about 8 years ago, history of DVT on chronic Coumadin, history of lymphedema was managed for a while at the clinic in [place]. History of inguinal hernia repair in September 22, hypertension, stage IIIb chronic renal failure ABIs today were noncompressible on the right 1.12 on the left 04-02-2022 upon evaluation today patient appears to be doing well currently in regard to his legs I do feel like both areas that are draining are actually much drier than they were in the picture last week although the left is drier than the right. He is tolerating the 4-layer compression wraps at this point he did contact the pump company and they are actually working on getting him a new compression sleeve for one of his legs which have previously popped and was not functioning properly. 04-23-2022 upon evaluation today patient appears to be doing well currently in regard to his wounds on the legs. I am actually very pleased with where things stand and I do feel like that we are headed in the right direction. Fortunately there is no sign of active infection locally or systemically at this time. 05-07-2022 upon evaluation today patient appears to be doing well currently in regard to his wounds in fact things are showing signs of improvement which is good news I do not see too much that actually appears to be open and I am very pleased in that regard. No fevers, chills, nausea, vomiting, or diarrhea. 05-21-2022 upon evaluation today  patient appears to be doing somewhat poorly in regard to drainage of his lower extremities bilaterally. The right is greater than left as far as the weeping area. Nonetheless it seems to be getting worse not better. He actually has pitting edema which is at least 2+ to the thighs and I am concerned about the fact that he is may be fluid overloaded in general and that is the reason why we cannot get this under control. I know he is not using his pumps all the time because he actually told the nurse that he was either going to pump or he was going to use his fluid pills but not do both. For that reason I do think that he needs to be really doing both in order to get the fluid out as effectively as possible obviously with the 4-layer compression wraps were doing as much as we can from a compression standpoint but it is really not enough. He tells me that he elevates his leg is much as he can in between pumping and other activity throughout the day. 05-28-2022 upon evaluation today patient appears to be doing better in regard to his wounds although the measurements may be a little bit larger this is a very difficult wound to heal it is very indistinct in a lot of areas. Nonetheless there is can be some need for sharp debridement in regard to  both medial and lateral legs. Fortunately I see no signs of active infection locally nor systemically at this time. No fevers, chills, nausea, vomiting, or diarrhea. 06-04-2022 upon evaluation today patient appears to be doing poorly in general in regard to the wounds on his legs. He still continues to have a tremendous amount of fluid not just in the lower portion of his leg but to be honest his thigh where he has 2-3+ pitting edema in the thigh as well. Unfortunately I do not know that we will be able to get this healed effectively and keep it healed on the lower extremities unless he gets the overall fluid situation taking and under control. Fortunately I do not see  any signs of infection locally nor systemically which is great news. He just seems to be very fluid overloaded. 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 He has been tolerating the dressing changes with the compression wraps without complication. Fortunately I do not see any evidence of infection at this time which is great news. No fevers, chills, nausea, vomiting, or diarrhea. 06-18-2022 upon evaluation today patient appears to be doing well currently in regard to his wounds as far as not looking like they are terribly infected but nonetheless I am concerned about a subacute infection secondary to the fact that he continues to have spreading despite the compression therapy. We actually did do an Unna boot on him last week this is actually the first wrap that actually stayed up everything else has been sliding down quite significantly. Delossantos, [name] ([id number]) [id number] Page 9 of 13 Fortunately there does not appear to be any signs of infection systemically at this time. With that being said I do believe that locally there seems to be an issue going on here and again I Ernie Hew do a PCR culture to see what that shows also think that I am going to put him on a broad-spectrum antibiotic, doxycycline to see how that will help as well. He does tell me that coming into the clinic today that he was feeling short of breath like "he was about to have a heart attack" because he was having such a hard time breathing. He says that he told this to Dr. Jodelle Green his cardiologist as well when he was evaluated in the past 1 to 2 weeks. 07-02-2022 upon evaluation today patient appears to be doing poorly currently in regard to his wound. He has been tolerating the dressing changes. Unfortunately he has not had any compression wraps on for the past week because he was unable to  make it in for his appointment last week. With that being said he has a significant amount of drainage he tells me has been using his pumps but despite this in the pumps he still has been draining quite a bit. The drainage is also somewhat purulent unfortunately. We did attempt to get in touch with his cardiologist last week unfortunately we were unable to get up with him I did advise that the patient needs to get in touch with him upon leaving today in order to make sure they know he is on the new antibiotics I am going to send him this will be Levaquin and Augmentin. 07-09-2022 upon evaluation today patient appears to be doing about the same in regard to his legs he may have just a slight amount of improvement with regard to the drainage probably Keystone topical antibiotics are helping in this  regard to some degree. Fortunately there does not appear to be any signs of active infection systemically which is great news. No fevers, chills, nausea, vomiting, or diarrhea. 07-16-2022 upon evaluation today patient appears to be doing well currently in regard to his wound. He has been tolerating the dressing changes without complication. Fortunately there does not appear to be any signs of active infection locally nor systemically at this time. With that being said he cannot keep the wraps up he tells me on the left side he had to cut this down because it got too tight. He has been using his pumps but he is on the right side the wrap actually straight down causing some pushing around the central part of his leg just below the calf I think this is a bigger risk for him that help at this point. I think that we may need to try something different he should be getting his compression socks shortly he tells me they were ordered last Thursday. 3/6; ; this is a patient who lives in [place]. He has severe bilateral lymphedema. He has compression pumps, we have been using kerlix Ace wrap Keystone. He is changing the  dressing. We do not have home health. 08-06-2022 upon evaluation today patient appears to be doing a little better in regard to his wounds in general at this point. Fortunately there does not appear to be any signs of active infection locally nor systemically at this time which is great news and overall I am extremely pleased with where we stand today. 08-13-2022 upon evaluation today patient appears to actually be doing significantly better compared to last week. He actually did go to the hospital I told him that he needed to when he left here and he actually did go. With that being said they actually ended up admitting him he was having shortness of breath and I thought it might be related to congestive heart failure turns out he actually had a pulmonary embolism. Subsequently they were able to get him off of the Coumadin switching over to Eliquis to get things stabilized in that regard they also had them wrapped and got his swelling under control on his legs he actually looks much better pretty much across the board at this point. I am very pleased in that regard. With that being said I am very happy that he finally went that could have been a very dangerous situation. 08-20-2022 upon evaluation today patient appears to be doing well currently in regard to his wound. Has been tolerating the dressing changes without complication. Fortunately there does not appear to be any signs of active infection locally nor systemically at this time. I think his legs are doing better there is some need for sharp debridement today. 08-27-2022 upon evaluation patient is actually making excellent progress. I am actually very pleased with where he stands and I think that he is moving in the right direction. In general I think that we are looking pretty good at the moment. 09-03-2022 upon evaluation today patient appears to be doing well currently in regard to his wound. He is actually tolerating dressing changes on the left  and right leg without complication. Fortunately I do not see any need for debridement of the left leg the right leg I think we probably do some need to perform some debridement here. 09-10-2022 upon evaluation today patient's wounds actually showed signs of improvement in both legs I do not see much is going require debridement today which was great  news. Fortunately I do not see any evidence of infection which I think is also excellent he seems to be using his pumps and doing everything right I am happy about how this is progressing at this point. 09-17-2022 upon evaluation today patient appears to be doing decently well in regard to his wounds. He has been tolerating the dressing changes without complication. Fortunately there does not appear to be any signs of active infection at this time which is good news. 09-24-2022 upon evaluation today patient appears to be doing well currently in regard to his wounds. He has been tolerating the dressing changes without complication. Fortunately there does not appear to be any signs of active infection locally nor systemically which is great news. No fevers, chills, nausea, vomiting, or diarrhea. 10-08-2022 upon evaluation today patient appears to be doing excellent currently in regard to his wound. He has been tolerating the dressing changes without complication though it does not sound like he has been using his compression wraps for a bit here. He does think he was doing better with the [facility] topical antibiotics we can definitely go back to using that but I think the biggest issue here is that his swelling is just very out of control and needs to be under control. I discussed that with him today. 10-15-2022 upon evaluation today patient appears to be doing well currently in regard to his wounds. He is actually making some progress which is good news. Fortunately I do not see any evidence of active infection locally nor systemically which is great news as  well. No fevers, chills, nausea, vomiting, or diarrhea. He does have a callused area on the plantar aspect of his left foot which is actually causing some pain and he wonders if I can trim this down for him. 10-22-2022 upon evaluation today patient seems to be making progress. He is actually doing quite well and very pleased in that regard. I do not see any signs of active infection at this time. 11-05-2022 upon evaluation patient appears to be making progress although slowly towards closure. He seems to be doing well with regard to his legs were still using the [facility] topical antibiotics and he seems to be doing quite well. He is going require some sharp debridement today. 11-19-2022 upon evaluation today patient unfortunately appears to be extremely swollen at this point. He tells me that he ran out of supplies he also tells me his leg started leaking more because of not having supplies he was unable to wear his compression wraps that is the juxta fit compression wraps. Therefore his legs are extremely swollen much larger than normal and do not appear to be doing well at all today. He is going require some debridement I also think he is going require [place] to perform compression wrapping today. 11-26-2022 upon evaluation today patient appears to be doing well currently in regard to his legs as far as infection is concerned I see nothing that appears to be infected. Fortunately I do not see any signs of active infection systemically either which is also good news. With that being said he still is extremely swollen as far as his legs are concerned. I do not see any signs of overall worsening but also do not see any signs of significant improvement which is the major issue here. 12-03-2022 upon evaluation today patient appears to be doing poorly still in regard to his legs the apparently has been taken off the wraps and not leaving the week by week.  With that being said he has not had really the dressings to  put on the release that is running out and subsequently though he is using his wraps I am not sure that he has been keeping them on like he needs to. I explained to him I really wanted to have the wraps that I put on him left on until he comes back to see me so that we can keep the compression under better control. He voiced understanding today he tells me that he was "confused about that". Nonetheless based on what I am seeing right now also think that he is up on his feet too much I think he needs to get the feet elevated. 7/24; this is a patient with severe bilateral stage III lymphedema. On the right lower leg medially is a large area of macerated denuded skin was too small deeper areas in this. On the left leg he is 2 areas that are a little more standard in terms of wounds. Massive lymphedema bilaterally. He has compression pumps at home which he uses for 1 hour twice a day Scully, Levoy ([id number]) [phone].pdf Page 10 of 13 12-17-2022 upon evaluation today patient's legs though a little bit smaller still continue to have significant issues with weeping here this just does not dry. I really think he needs to be changing this daily and this means we will probably have to try to go back to the Velcro wraps and give this a shot. I think may be going to the Velcro wraps, changing the dressing to a superabsorber like XtraSorb or the like, and have him elevate his legs and do his pumps 3 times per day is probably going to be the way to go to try to see if we can make some improvement here. 12-24-2022 upon evaluation today patient unfortunately is still struggling to get anything under control. We have been extremely aggressive and trying to: Control his swelling everything we do however seems to be met with resistance. He tells me that he is pumping 3 times per day with his lymphedema pumps. He also tells me that he is try to elevate his legs is much as possible and that  subsequently he has been keeping his compression wraps on. He goes back and forth between saying that he can keep our wraps on and not but right now I really do not see that he is doing well with the juxta fit is at least not from the standpoint of improving the overall appearance I think it does to some degree help maintain but we are still struggling here. I have voiced a concern to the patient that despite everything you are doing I am still not seeing a lot of improvement here and to be honest I am not sure what is can take get this under control he may have to go into the hospital for admission. 12-31-2022 upon evaluation today patient appears to be doing well currently in regard to his wounds. They seem a little better although he has several pustules around the legs in general but has been concerned about infection. I am actually going to go ahead and see about getting an antibiotic sent into the pharmacy today although I am going to obtain a PCR culture as well. 01-07-2023 upon evaluation today patient appears to be doing well currently in regard to his wound. Has been tolerating the dressing changes without complication. He actually seems to be doing much better. He did have a PCR culture which  showed Serratia as well and the prominent organisms here. Levaquin was actually a good option to treat the Serratia along with the other organisms that were problematic including E. coli. Nonetheless I think coupled with the topical Keystone antibiotics and the alginate he is really doing quite well today. 8/28; patient with severe bilateral lymphedema he has wounds bilaterally on his lower legs but they look a lot better today. We are using silver alginate and Keystone with Urgo K2 lite wraps. He comes in today with his wounds looking quite good. He was prescribed Levaquin last time I think he will complete this in a few days. 01-21-2023 upon evaluation today patient appears to be doing worse in regard to  his swelling took his wrap off he tells me he believes on Saturday. With that being said his leg is significantly swollen since that time and unfortunately does not appear to be doing nearly as good as it was last time I saw him 2 weeks ago or even last week for that matter. I am very concerned about this. 01-28-2023 upon evaluation today patient appears to be doing poorly currently in regard to his legs. He has been doing everything he says that we have recommended, he continues to use his lymphedema pumps 3 times a day, he has been doubling up on his fluid pills, he has been elevating his legs, and he tells me that he also has kept the wraps on and in fact he did have them on the day when he came in. Again that for feels the compression side of things. Nonetheless with everything going on here we still have not been able to get this completely under control. He continues to have issues with significant swelling of lower extremities and this is not limited to just his lower left portion of his legs but his thighs are fairly large as well. With that being said I really feel like that we have reached the extent of what we have to offer here at the wound care center I am wondering if we can see about making a referral for second opinion to [facility] to see if there is anything they could do to help him out at this point. It is really not much further from his home to there as it is from his home to here. Both are about an hour distance. He lives in [place]. 02-04-2023 upon evaluation today patient appears to be doing much better actually compared to last week's evaluation. This is actually significant improvement and overall I do not know exactly what he did different other than he tells me that he "has been taking his fluid pills every day instead of every other day. Obviously this is needed. I was under the understanding previously that he was taking this every day but I think he may also  not been pumping every day like he was telling me but I cannot be sure this either way he does seem to be doing what he supposed to do at this point. 02-11-2023 upon evaluation today patient unfortunately appears to be about as bad today as he appeared good last week. I am not sure what happened other than the fact he did take his wrap off the right leg he tells me it got so tight that he could not feel his toes they are getting very numb. Subsequently he tells me that it was difficulty with driving therefore he took it off. With that being said I do believe that he  may need to go to the ER for evaluation and treatment I feel he may need to be admitted to get some of the fluid off of his legs. I think that he is volume overloaded and at this point I do not think there is much working to be able to do to manage this outpatient we have tried pretty much everything that I am going to do. 02-18-23 upon evaluation today patient appears to be doing pretty well currently compared to last week this is actually better today. He is less weeping and the legs are actually significantly smaller. He tells me has been trying to stay in bed with his legs elevated more. 02-25-2023 upon evaluation today patient appears to be doing well currently in regard to his legs in general compared to where we have been. Fortunately I do not see any signs of active infection locally or systemically which is great news and in general I do believe that we are making good headway here towards complete closure. I am hopeful that he will continue to show signs of improvement as we progress going forward. He does seem to have been taking his fluid pills as well as elevating his legs and using lymphedema pumps on a regular basis. He is trying to stay out of the hospital. 03-04-2023 upon evaluation today patient appears to be doing well currently in regard to his wound. He has been tolerating the dressing changes without complication.  Fortunately there does not appear to be any signs of active infection at this time. 03-11-2023 upon evaluation today patient appears to be doing well currently in regard to his wounds. He has been tolerating the dressing changes without complication. Fortunately I do not see any signs of active infection at this time which is great news. 11/6; patient missed his clinic appointment last week. He took his wraps off at some point. Did not take his diuretics because he was nauseated last week. He comes in with massive bilateral edema which is lymphedema and probably some degree of systemic fluid overload. Objective Constitutional Patient is hypertensive.. Pulse regular and within target range for patient.Marland Kitchen Respirations regular, non-labored and within target range.. Temperature is normal and within the target range for the patient.Marland Kitchen Appears in no distress. Vitals Time Taken: 3:08 AM, Height: 74 in, Weight: 250 lbs, BMI: 32.1, Temperature: 98.0 F, Pulse: 111 bpm, Respiratory Rate: 18 breaths/min, Blood Pressure: 191/79 mmHg. General Notes: Wound exam; much more substantial breakdown on the right medially extending posteriorly this is almost circumferential. He has a more localized area on the left medial calf. All of these covered in a nonviable necrotic surface. Because of the extreme amount of edema in both legs lymphedema, no debridement was contemplated. He has pitting edema in the right greater than left thigh. Elevated jugular venous pressure. His lungs are clear. Pansystolic murmur sounding like tricuspid regurg . Respiratory exam is clear Yeagley, Nicandro ([id number]) [phone].pdf Page 11 of 13 Integumentary (Hair, Skin) Wound #12 status is Open. Original cause of wound was Frostbite. The date acquired was: 12/17/2022. The wound has been in treatment 14 weeks. The wound is located on the Left,Anterior Lower Leg. The wound measures 0.5cm length x 0.5cm width x 0.1cm depth;  0.196cm^2 area and 0.02cm^3 volume. There is Fat Layer (Subcutaneous Tissue) exposed. There is no tunneling or undermining noted. There is a large amount of serous drainage noted. The wound margin is distinct with the outline attached to the wound base. There is medium (34-66%) red, pink granulation  within the wound bed. There is a medium (34-66%) amount of necrotic tissue within the wound bed including Adherent Slough. The periwound skin appearance exhibited: Hemosiderin Staining. The periwound skin appearance did not exhibit: Callus, Crepitus, Excoriation, Induration, Rash, Scarring, Dry/Scaly, Maceration, Atrophie Blanche, Cyanosis, Ecchymosis, Mottled, Pallor, Rubor, Erythema. Periwound temperature was noted as No Abnormality. Wound #15 status is Open. Original cause of wound was Gradually Appeared. The date acquired was: 01/14/2023. The wound has been in treatment 5 weeks. The wound is located on the Left,Distal,Posterior Lower Leg. The wound measures 11cm length x 7.5cm width x 0.1cm depth; 64.795cm^2 area and 6.48cm^3 volume. There is Fat Layer (Subcutaneous Tissue) exposed. There is no tunneling or undermining noted. There is a medium amount of serosanguineous drainage noted. The wound margin is distinct with the outline attached to the wound base. There is small (1-33%) pink granulation within the wound bed. There is a large (67-100%) amount of necrotic tissue within the wound bed including Adherent Slough. The periwound skin appearance exhibited: Hemosiderin Staining. The periwound skin appearance did not exhibit: Callus, Crepitus, Excoriation, Induration, Rash, Scarring, Dry/Scaly, Maceration, Atrophie Blanche, Cyanosis, Ecchymosis, Mottled, Pallor, Rubor, Erythema. Wound #6 status is Open. Original cause of wound was Gradually Appeared. The date acquired was: 03/05/2022. The wound has been in treatment 52 weeks. The wound is located on the Right,Medial Lower Leg. The wound measures 15cm  length x 25cm width x 0.1cm depth; 294.524cm^2 area and 29.452cm^3 volume. There is Fat Layer (Subcutaneous Tissue) exposed. There is no tunneling or undermining noted. There is a large amount of serous drainage noted. The wound margin is distinct with the outline attached to the wound base. There is small (1-33%) pink granulation within the wound bed. There is a large (67-100%) amount of necrotic tissue within the wound bed. The periwound skin appearance exhibited: Scarring, Hemosiderin Staining. The periwound skin appearance did not exhibit: Callus, Crepitus, Excoriation, Induration, Rash, Dry/Scaly, Maceration, Atrophie Blanche, Cyanosis, Ecchymosis, Mottled, Pallor, Rubor, Erythema. Periwound temperature was noted as No Abnormality. Wound #7 status is Open. Original cause of wound was Gradually Appeared. The date acquired was: 03/05/2022. The wound has been in treatment 52 weeks. The wound is located on the Left,Medial Lower Leg. The wound measures 6cm length x 16cm width x 0.1cm depth; 75.398cm^2 area and 7.54cm^3 volume. There is Fat Layer (Subcutaneous Tissue) exposed. There is no tunneling or undermining noted. There is a large amount of serous drainage noted. The wound margin is distinct with the outline attached to the wound base. There is small (1-33%) red granulation within the wound bed. There is a large (67-100%) amount of necrotic tissue within the wound bed including Eschar. The periwound skin appearance exhibited: Scarring, Hemosiderin Staining. The periwound skin appearance did not exhibit: Callus, Crepitus, Excoriation, Induration, Rash, Dry/Scaly, Maceration, Atrophie Blanche, Cyanosis, Ecchymosis, Mottled, Pallor, Rubor, Erythema. Periwound temperature was noted as No Abnormality. 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 There was a Double Layer Compression Therapy Procedure by [name], RN. Post procedure Diagnosis Wound #12: Same as Pre-Procedure Wound #15 Pre-procedure diagnosis of Wound #15 is a Lymphedema located on the Left,Distal,Posterior Lower Leg .  There was a Double Layer Compression Therapy Procedure by [name], RN. Post procedure Diagnosis Wound #15: Same as Pre-Procedure Wound #6 Pre-procedure diagnosis of Wound #6 is a Lymphedema located on the Right,Medial Lower Leg . There was a Double Layer Compression Therapy Procedure by [name], RN. Post procedure Diagnosis Wound #6: Same as Pre-Procedure Wound #7 Pre-procedure diagnosis of Wound #7 is a Lymphedema located on the Left,Medial Lower Leg . There was a Double Layer Compression Therapy Procedure by [name], RN. Post procedure Diagnosis Wound #7: Same as Pre-Procedure Plan Follow-up Appointments: Return Appointment in 1 week. - Wednesday****extra time 60 minutes**** 04/01/2023 3pm Return Appointment in 2 weeks. - Wednesday Dr. Leanord Hawking 04/08/2023 (front office to schedule) Cordon, [name] ([id number]) [phone].pdf Page 12 of 13 Return appointment in 1 month. - Please see front desk to schedule appointment Nurse Visit: - 04/15/2023 ******60 minutes***** Anesthetic: (In clinic) Topical Lidocaine 5% applied to wound bed Bathing/ Shower/ Hygiene: May shower with protection but do not get wound dressing(s) wet. Protect dressing(s) with water repellant cover (for example, large plastic bag) or a cast cover and may then take shower. Off-Loading: Other: - When sitting please keep your feet up, keep legs at Heart  Level or above. Use pillows behind calf to for comfort. Additional Orders / Instructions: Follow Nutritious Diet - Try and increase protein intake. Goal of protein intake is 60g-100g. WOUND #12: - Lower Leg Wound Laterality: Left, Anterior Cleanser: Soap and Water 1 x Per Week/30 Days Discharge Instructions: May shower and wash wound with dial antibacterial soap and water prior to dressing change. Peri-Wound Care: Sween Lotion (Moisturizing lotion) 1 x Per Week/30 Days Discharge Instructions: Apply moisturizing lotion as directed Prim Dressing: Maxorb Extra CMC/Alginate Dressing, 4x4 (in/in) 1 x Per Week/30 Days ary Discharge Instructions: Apply to wound bed as instructed Secondary Dressing: ABD Pad, 8x10 1 x Per Week/30 Days Discharge Instructions: Apply over primary dressing as directed. Secondary Dressing: Zetuvit Plus 4x8 in 1 x Per Week/30 Days Discharge Instructions: Apply over primary dressing as directed. Secondary Dressing: OptiLock Super Absorbent, 5x5.5 (in/in) (Generic) 1 x Per Week/30 Days Discharge Instructions: Apply directly to wound bed as directed Com pression Wrap: Urgo K2, (equivalent to a 4 layer) two layer compression system, regular 1 x Per Week/30 Days Discharge Instructions: Apply Urgo K2 as directed (alternative to 4 layer compression). WOUND #15: - Lower Leg Wound Laterality: Left, Posterior, Distal Cleanser: Soap and Water 1 x Per Week/30 Days Discharge Instructions: May shower and wash wound with dial antibacterial soap and water prior to dressing change. Peri-Wound Care: Sween Lotion (Moisturizing lotion) 1 x Per Week/30 Days Discharge Instructions: Apply moisturizing lotion as directed Prim Dressing: Maxorb Extra CMC/Alginate Dressing, 4x4 (in/in) 1 x Per Week/30 Days ary Discharge Instructions: Apply to wound bed as instructed Secondary Dressing: ABD Pad, 8x10 1 x Per Week/30 Days Discharge Instructions: Apply over primary dressing as directed. Secondary  Dressing: Zetuvit Plus 4x8 in 1 x Per Week/30 Days Discharge Instructions: Apply over primary dressing as directed. Secondary Dressing: OptiLock Super Absorbent, 5x5.5 (in/in) (Generic) 1 x Per Week/30 Days Discharge Instructions: Apply directly to wound bed as directed Com pression Wrap: Urgo K2, (equivalent to a 4 layer) two layer compression system, regular 1 x Per Week/30 Days Discharge Instructions: Apply Urgo K2 as directed (alternative to 4 layer compression). WOUND #6: - Lower Leg Wound Laterality: Right, Medial Cleanser: Soap and Water 1 x Per Week/30 Days Discharge Instructions: May shower and wash wound with dial antibacterial  soap and water prior to dressing change. Peri-Wound Care: Sween Lotion (Moisturizing lotion) 1 x Per Week/30 Days Discharge Instructions: Apply moisturizing lotion as directed Prim Dressing: Maxorb Extra CMC/Alginate Dressing, 4x4 (in/in) 1 x Per Week/30 Days ary Discharge Instructions: Apply to wound bed as instructed Secondary Dressing: ABD Pad, 8x10 1 x Per Week/30 Days Discharge Instructions: Apply over primary dressing as directed. Secondary Dressing: Zetuvit Plus 4x8 in 1 x Per Week/30 Days Discharge Instructions: Apply over primary dressing as directed. Secondary Dressing: OptiLock Super Absorbent, 5x5.5 (in/in) (Generic) 1 x Per Week/30 Days Discharge Instructions: Apply directly to wound bed as directed Com pression Wrap: Urgo K2, (equivalent to a 4 layer) two layer compression system, regular 1 x Per Week/30 Days Discharge Instructions: Apply Urgo K2 as directed (alternative to 4 layer compression). WOUND #7: - Lower Leg Wound Laterality: Left, Medial Cleanser: Soap and Water 1 x Per Week/30 Days Discharge Instructions: May shower and wash wound with dial antibacterial soap and water prior to dressing change. Peri-Wound Care: Sween Lotion (Moisturizing lotion) 1 x Per Week/30 Days Discharge Instructions: Apply moisturizing lotion as directed Prim  Dressing: Maxorb Extra CMC/Alginate Dressing, 4x4 (in/in) 1 x Per Week/30 Days ary Discharge Instructions: Apply to wound bed as instructed Secondary Dressing: ABD Pad, 8x10 1 x Per Week/30 Days Discharge Instructions: Apply over primary dressing as directed. Secondary Dressing: Zetuvit Plus 4x8 in 1 x Per Week/30 Days Discharge Instructions: Apply over primary dressing as directed. Secondary Dressing: OptiLock Super Absorbent, 5x5.5 (in/in) (Generic) 1 x Per Week/30 Days Discharge Instructions: Apply directly to wound bed as directed Com pression Wrap: Urgo K2, (equivalent to a 4 layer) two layer compression system, regular 1 x Per Week/30 Days Discharge Instructions: Apply Urgo K2 as directed (alternative to 4 layer compression). 1. We use silver alginate,Zetuvit, ABDs under 4-layer compression bilaterally 2. I asked him to take his diuretics when he got home I think there is some degree of systemic fluid volume overload 3. He has massive lymphedema in both legs. I gather is some degree of noncompliance with compression wraps, diuretic use etc. I think the likelihood of healing the wounds on the right are very low and virtually 0 if he is not compliant in maintaining compression wraps to reduce swelling in the right leg. Electronic Signature(s) Signed: 03/25/2023 4:54:50 PM By: Baltazar Najjar MD Entered By: Baltazar Najjar on 03/25/2023 13:36:44 Kisling, [name] [id number] Page 13 of 13 -------------------------------------------------------------------------------- SuperBill Details Patient Name: Date of Service: Hulet, [name] 03/25/2023 Medical Record Number: [id number] Patient Account Number: [id number] Date of Birth/Sex: Treating RN: May 09, 1946 (77 y.o. [name], [name] Primary Care Provider: [name] Other Clinician: Referring Provider: Treating Provider/Extender: Valentina Gu Weeks in Treatment: 77 Diagnosis  Coding ICD-10 Codes Code Description [phone] Chronic venous hypertension (idiopathic) with ulcer and inflammation of bilateral lower extremity I89.0 Lymphedema, not elsewhere classified 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.818 Non-pressure chronic ulcer of other part of right lower leg with other specified severity L84 Corns and callosities Facility Procedures : CPT4: Code [id number] Description: 81 BILATERAL: Application of multi-layer venous compression system; leg (below knee), including ankle and t. Modifier: Quantity: 1 Physician Procedures : CPT4 Code Description Modifier [id number] - WC PHYS LEVEL 4 - EST PT ICD-10 Diagnosis Description 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.818 Non-pressure chronic ulcer of other part of  right  lower leg with other specified severity I89.0 Lymphedema, not elsewhere classified I87.333 Chronic venous hypertension (idiopathic) with ulcer and inflammation of bilateral lower extremity Quantity: 1 Electronic Signature(s) Signed: 03/25/2023 4:54:50 PM By: Baltazar Najjar MD Entered By: Baltazar Najjar on 03/25/2023 13:37:11

## 2023-04-01 ENCOUNTER — Encounter (HOSPITAL_BASED_OUTPATIENT_CLINIC_OR_DEPARTMENT_OTHER): Payer: Medicare Other | Admitting: Internal Medicine

## 2023-04-01 DIAGNOSIS — I87333 Chronic venous hypertension (idiopathic) with ulcer and inflammation of bilateral lower extremity: Secondary | ICD-10-CM | POA: Diagnosis not present

## 2023-04-02 NOTE — Progress Notes (Signed)
[name], [name] ([id number]) [id number] Page 1 of 14 Visit Report for 04/01/2023 HPI Details Patient Name: Date of Service: [name], [name] 04/01/2023 3:00 PM Medical Record Number: [id number] Patient Account Number: [id number] Date of Birth/Sex: Treating RN: 1946-03-14 (77 y.o. M) Primary Care Provider: [name] Other Clinician: Referring Provider: Treating Provider/Extender: Valentina Gu Weeks in Treatment: 48 History of Present Illness HPI Description: ADMISSION 02/23/2020 Patient is a 77 year old man who lives in [place] who arrives accompanied by his wife. He has a history of chronic lymphedema and venous insufficiency in his bilateral lower legs which may have something to do that with having a history of DVT as well as being treated for prostate cancer. In any case he recently got compression pumps at home but compliance has been an issue here. He has compression stockings however they are probably not sufficient enough to control swelling. They tell us that things deteriorated for him in late August he was admitted to [facility] for 7 days. This was with cellulitis I think of his bilateral lower legs. Discharge he was noted to have wounds on his bilateral lower legs. He was discharged on Bactrim. They tried to get him home health through [facility] of course they declined him. His wife is been wrapping these applying some form of silver foam dressing. He has a history of wounds before although nothing that would not heal with basic home topical dressings. He has 2 areas on the left medial, left anterior and left lateral and a smaller area on the right medial. All of these have considerable depth. Past medical history includes iron deficiency anemia, lymphedema followed by the rehab center at [facility] with lymphedema wraps I believe, DVT on chronic anticoagulation, prostate cancer, chronic venous  insufficiency, hypertension. As mentioned he has compression pumps but does not use them. ABIs in our clinic were noncompressible bilaterally 10/14; patient with severe bilateral lymphedema right greater than left. He came in with bilateral lower extremity wounds left greater than right. Even though the right side has more of the edema most of the wounds here almost closed on the right medial. He has 3 remaining wounds on the left We have been using silver alginate under 4-layer compression I have been trying to get him to be compliant with his external compression pumps 10/21; patient with 3 small wounds on the left leg and 1 on the right medial in the setting of severe lymphedema and chronic venous insufficiency. We have been using silver alginate under 4-layer compression he is using his external compression pumps twice a day 11/4; ARTERIAL STUDIES on the right show an ABI of 1.02 TBI of 0.858 with biphasic waveforms on the left 0.98 with a TBI of 0.55 and biphasic waveforms. Does not look like he has significant arterial disease. We are treating him for lymphedema he has compression pumps. He has punched-out areas on the left anterior left lateral and right medial lower extremities 11/11; after we obtained his arterial studies I put him in 4 layer compression. He is using his compression pumps probably once a day although I have asked him to do twice. Primary dressing to the wound is silver collagen he has severe lymphedema likely secondary to chronic venous insufficiency. Wounds on the left lateral, left medial and left anterior and a small area on the right medial 12/2; the area on the right anterior lower leg has healed. We initially thought that the area medially had healed as  well however when her discharge nurse came in she detected fluid in the wound simply opened up. This is actually worse than I remember this pain. The area on the left lateral potentially slightly smaller He is also  complaining about pain in his left hand he says that this is actually been getting some better he has been using topical creams on this. She asked that I look at this 12/9 after last weeks issues we have 2 wounds one on the right medial lower leg and 1 on the left lateral. Both of these are in the same condition. I think because of thickened skin secondary to chronic lymphedema these wounds actually have depth of almost 0.8 cm. 12/16; the patient has 2 small but deep wounds one on the right medial and one on the left lateral. The right medial is actually the worst of these. He arrives in clinic today with absolutely terrible edema in the right leg apparently his 4-layer wrap fell down to just above his ankle he did not think about this he is apparently been continuing to use his compression pump twice a day. The left leg looks a lot better. 05/09/2020 upon evaluation today patient appears to be doing decently well in regard to his wounds. Everything is measuring smaller the right leg still has a little bit deeper wound in the left seems to be almost completely healed in my opinion I am very pleased in general with how things are progressing. He has a 4- layer compression wrap we have been using endoform today we will probably have to use collagen just based on the fact that we do not have endoform it is on order. 1/6; the patient's wound on the left lateral lower leg has healed. Still has 1 on the right medial. 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 Using compression pumps at home twice a day. 1/13; left lateral lower leg is still healed. He has a deep punched out rectangular shaped wound on the right medial calf. Looking down at this it appears that he is attempting to epithelialize around the edges of the wound and on the base as well. His edema is reasonably well controlled we have been using collagen with absolutely no effect 1/20; left lateral lower leg remains closed he has  extremitease stockings. The area on the right medial calf I aggressively debrided last week measures larger but the surface looks better. We have been using Hydrofera Blue. We ran Oasis through his insurance but we have not seen the results of this 1/27; left lower leg wound with chronic venous insufficiency and secondary lymphedema. I did aggressive debridement on this last week the wound seems to have come in healthy looking surface using Hydrofera Blue. He was denied for Oasis 2/3; small divot in the right medial lower leg. Under illumination the walls of this divot are epithelialized however the base has slough which I removed with a curette we have been using Hydrofera Blue 2/10 small divot on the right medial lower leg pinpoint illumination at the base of this cone-shaped wound. We have been using Hydrofera Blue but I will switch to calcium alginate this week 2/17; the small divot on the right medial lower leg is fully epithelialized. There is no visible open area under illumination. He has his own stocking for the right leg similar to the one he has been wearing on the left. Stifter, [name] ([id number]) [id number] Page 2 of 14 03/26/2022; READMISSION This is a now 77 year old man that  we had in the clinic from 02/23/2020 through 07/05/2020. At that point he had bilateral lower extremity wounds left greater than right in the setting of severe lymphedema. He had already obtained compression pumps ordered for him I think from the wound care clinic in [facility] so I do not really have record of what he has been using. He claims to be using them once a day but there is a problem with the sleeve on the left leg. About 2 weeks ago he was hospitalized from 03/11/2022 through 03/14/2022 with diastolic congestive heart failure. 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 He was diuresed. Developed some prerenal azotemia  and he has not been taking any diuretics currently. He has not been putting stockings on his legs since he got out of hospital and still has his legs dependent for long periods. Past medical history history of prostate cancer treated with prostatectomy and radiation this was apparently about 8 years ago, history of DVT on chronic Coumadin, history of lymphedema was managed for a while at the clinic in [place]. History of inguinal hernia repair in September 22, hypertension, stage IIIb chronic renal failure ABIs today were noncompressible on the right 1.12 on the left 04-02-2022 upon evaluation today patient appears to be doing well currently in regard to his legs I do feel like both areas that are draining are actually much drier than they were in the picture last week although the left is drier than the right. He is tolerating the 4-layer compression wraps at this point he did contact the pump company and they are actually working on getting him a new compression sleeve for one of his legs which have previously popped and was not functioning properly. 04-23-2022 upon evaluation today patient appears to be doing well currently in regard to his wounds on the legs. I am actually very pleased with where things stand and I do feel like that we are headed in the right direction. Fortunately there is no sign of active infection locally or systemically at this time. 05-07-2022 upon evaluation today patient appears to be doing well currently in regard to his wounds in fact things are showing signs of improvement which is good news I do not see too much that actually appears to be open and I am very pleased in that regard. No fevers, chills, nausea, vomiting, or diarrhea. 05-21-2022 upon evaluation today patient appears to be doing somewhat poorly in regard to drainage of his lower extremities bilaterally. The right is greater than left as far as the weeping area. Nonetheless it seems to be getting worse not  better. He actually has pitting edema which is at least 2+ to the thighs and I am concerned about the fact that he is may be fluid overloaded in general and that is the reason why we cannot get this under control. I know he is not using his pumps all the time because he actually told the nurse that he was either going to pump or he was going to use his fluid pills but not do both. For that reason I do think that he needs to be really doing both in order to get the fluid out as effectively as possible obviously with the 4-layer compression wraps were doing as much as we can from a compression standpoint but it is really not enough. He tells me that he elevates his leg is much as he can in between pumping and other  activity throughout the day. 05-28-2022 upon evaluation today patient appears to be doing better in regard to his wounds although the measurements may be a little bit larger this is a very difficult wound to heal it is very indistinct in a lot of areas. Nonetheless there is can be some need for sharp debridement in regard to both medial and lateral legs. Fortunately I see no signs of active infection locally nor systemically at this time. No fevers, chills, nausea, vomiting, or diarrhea. 06-04-2022 upon evaluation today patient appears to be doing poorly in general in regard to the wounds on his legs. He still continues to have a tremendous amount of fluid not just in the lower portion of his leg but to be honest his thigh where he has 2-3+ pitting edema in the thigh as well. Unfortunately I do not know that we will be able to get this healed effectively and keep it healed on the lower extremities unless he gets the overall fluid situation taking and under control. Fortunately I do not see any signs of infection locally nor systemically which is great news. He just seems to be very fluid overloaded. 06-11-2022 upon evaluation today patient presents for follow-up concerning his bilateral lower  extremity lymphedema secondary to chronic venous insufficiency. He has been tolerating the dressing changes with the compression wraps without complication. Fortunately I do not see any evidence of infection at this time which is great news. No fevers, chills, nausea, vomiting, or diarrhea. 06-18-2022 upon evaluation today patient appears to be doing well currently in regard to his wounds as far as not looking like they are terribly infected but nonetheless I am concerned about a subacute infection secondary to the fact that he continues to have spreading despite the compression therapy. We actually did do an Unna boot on him last week this is actually the first wrap that actually stayed up everything else has been sliding down quite significantly. Fortunately there does not appear to be any signs of infection systemically at this time. With that being said I do believe that locally there seems to be an issue going on here and again I Ernie Hew do a PCR culture to see what that shows also think that I am going to put him on a broad-spectrum antibiotic, doxycycline to see how that will help as well. He does tell me that coming into the clinic today that he was feeling short of breath like "he was about to have a heart attack" because he was having such a hard time breathing. He says that he told this to Dr. Jodelle Green his cardiologist as well when he was evaluated in the past 1 to 2 weeks. 07-02-2022 upon evaluation today patient appears to be doing poorly currently in regard to his wound. He has been tolerating the dressing changes. Unfortunately he has not had any compression wraps on for the past week because he was unable to make it in for his appointment last week. With that being said he has a significant amount of drainage he tells me has been using his pumps but despite this in the pumps he still has been draining quite a bit. The drainage is also somewhat purulent unfortunately. We did attempt to get in  touch with his cardiologist last week unfortunately we were unable to get up with him I did advise that the patient needs to get in touch with him upon leaving today in order to make sure they know he is on the new antibiotics I  am going to send him this will be Levaquin and Augmentin. 07-09-2022 upon evaluation today patient appears to be doing about the same in regard to his legs he may have just a slight amount of improvement with regard to the drainage probably Keystone topical antibiotics are helping in this regard to some degree. Fortunately there does not appear to be any signs of active infection systemically which is great news. No fevers, chills, nausea, vomiting, or diarrhea. 07-16-2022 upon evaluation today patient appears to be doing well currently in regard to his wound. He has been tolerating the dressing changes without complication. Fortunately there does not appear to be any signs of active infection locally nor systemically at this time. With that being said he cannot keep the wraps up he tells me on the left side he had to cut this down because it got too tight. He has been using his pumps but he is on the right side the wrap actually straight down causing some pushing around the central part of his leg just below the calf I think this is a bigger risk for him that help at this point. I think that we may need to try something different he should be getting his compression socks shortly he tells me they were ordered last Thursday. 3/6; ; this is a patient who lives in [place]. He has severe bilateral lymphedema. He has compression pumps, we have been using kerlix Ace wrap Keystone. He is changing the dressing. We do not have home health. 08-06-2022 upon evaluation today patient appears to be doing a little better in regard to his wounds in general at this point. Fortunately there does not appear to be any signs of active infection locally nor systemically at this time which is great  news and overall I am extremely pleased with where we stand today. 08-13-2022 upon evaluation today patient appears to actually be doing significantly better compared to last week. He actually did go to the hospital I told him that he needed to when he left here and he actually did go. With that being said they actually ended up admitting him he was having shortness of breath and I thought it might be related to congestive heart failure turns out he actually had a pulmonary embolism. Subsequently they were able to get him off of the Coumadin switching over to Eliquis to get things stabilized in that regard they also had them wrapped and got his swelling under control on his legs he actually looks much better pretty much across the board at this point. I am very pleased in that regard. With that being said I am very happy that he finally went that could have been a very dangerous situation. 08-20-2022 upon evaluation today patient appears to be doing well currently in regard to his wound. Has been tolerating the dressing changes without complication. Fortunately there does not appear to be any signs of active infection locally nor systemically at this time. I think his legs are doing better there is some need for sharp debridement today. [name], [name] ([id number]) [id number] Page 3 of 14 08-27-2022 upon evaluation patient is actually making excellent progress. I am actually very pleased with where he stands and I think that he is moving in the right direction. In general I think that we are looking pretty good at the moment. 09-03-2022 upon evaluation today patient appears to be doing well currently in regard to his wound. He is actually tolerating dressing changes on the left and right leg  without complication. Fortunately I do not see any need for debridement of the left leg the right leg I think we probably do some need to perform some debridement here. 09-10-2022 upon  evaluation today patient's wounds actually showed signs of improvement in both legs I do not see much is going require debridement today which was great news. Fortunately I do not see any evidence of infection which I think is also excellent he seems to be using his pumps and doing everything right I am happy about how this is progressing at this point. 09-17-2022 upon evaluation today patient appears to be doing decently well in regard to his wounds. He has been tolerating the dressing changes without complication. Fortunately there does not appear to be any signs of active infection at this time which is good news. 09-24-2022 upon evaluation today patient appears to be doing well currently in regard to his wounds. He has been tolerating the dressing changes without complication. Fortunately there does not appear to be any signs of active infection locally nor systemically which is great news. No fevers, chills, nausea, vomiting, or diarrhea. 10-08-2022 upon evaluation today patient appears to be doing excellent currently in regard to his wound. He has been tolerating the dressing changes without complication though it does not sound like he has been using his compression wraps for a bit here. He does think he was doing better with the [facility] topical antibiotics we can definitely go back to using that but I think the biggest issue here is that his swelling is just very out of control and needs to be under control. I discussed that with him today. 10-15-2022 upon evaluation today patient appears to be doing well currently in regard to his wounds. He is actually making some progress which is good news. Fortunately I do not see any evidence of active infection locally nor systemically which is great news as well. No fevers, chills, nausea, vomiting, or diarrhea. He does have a callused area on the plantar aspect of his left foot which is actually causing some pain and he wonders if I can trim this down for  him. 10-22-2022 upon evaluation today patient seems to be making progress. He is actually doing quite well and very pleased in that regard. I do not see any signs of active infection at this time. 11-05-2022 upon evaluation patient appears to be making progress although slowly towards closure. He seems to be doing well with regard to his legs were still using the [facility] topical antibiotics and he seems to be doing quite well. He is going require some sharp debridement today. 11-19-2022 upon evaluation today patient unfortunately appears to be extremely swollen at this point. He tells me that he ran out of supplies he also tells me his leg started leaking more because of not having supplies he was unable to wear his compression wraps that is the juxta fit compression wraps. Therefore his legs are extremely swollen much larger than normal and do not appear to be doing well at all today. He is going require some debridement I also think he is going require [place] to perform compression wrapping today. 11-26-2022 upon evaluation today patient appears to be doing well currently in regard to his legs as far as infection is concerned I see nothing that appears to be infected. Fortunately I do not see any signs of active infection systemically either which is also good news. With that being said he still is extremely swollen as far as his legs  are concerned. I do not see any signs of overall worsening but also do not see any signs of significant improvement which is the major issue here. 12-03-2022 upon evaluation today patient appears to be doing poorly still in regard to his legs the apparently has been taken off the wraps and not leaving the week by week. With that being said he has not had really the dressings to put on the release that is running out and subsequently though he is using his wraps I am not sure that he has been keeping them on like he needs to. I explained to him I really wanted to have the wraps  that I put on him left on until he comes back to see me so that we can keep the compression under better control. He voiced understanding today he tells me that he was "confused about that". Nonetheless based on what I am seeing right now also think that he is up on his feet too much I think he needs to get the feet elevated. 7/24; this is a patient with severe bilateral stage III lymphedema. On the right lower leg medially is a large area of macerated denuded skin was too small deeper areas in this. On the left leg he is 2 areas that are a little more standard in terms of wounds. Massive lymphedema bilaterally. He has compression pumps at home which he uses for 1 hour twice a day 12-17-2022 upon evaluation today patient's legs though a little bit smaller still continue to have significant issues with weeping here this just does not dry. I really think he needs to be changing this daily and this means we will probably have to try to go back to the Velcro wraps and give this a shot. I think may be going to the Velcro wraps, changing the dressing to a superabsorber like XtraSorb or the like, and have him elevate his legs and do his pumps 3 times per day is probably going to be the way to go to try to see if we can make some improvement here. 12-24-2022 upon evaluation today patient unfortunately is still struggling to get anything under control. We have been extremely aggressive and trying to: Control his swelling everything we do however seems to be met with resistance. He tells me that he is pumping 3 times per day with his lymphedema pumps. He also tells me that he is try to elevate his legs is much as possible and that subsequently he has been keeping his compression wraps on. He goes back and forth between saying that he can keep our wraps on and not but right now I really do not see that he is doing well with the juxta fit is at least not from the standpoint of improving the overall appearance I think  it does to some degree help maintain but we are still struggling here. I have voiced a concern to the patient that despite everything you are doing I am still not seeing a lot of improvement here and to be honest I am not sure what is can take get this under control he may have to go into the hospital for admission. 12-31-2022 upon evaluation today patient appears to be doing well currently in regard to his wounds. They seem a little better although he has several pustules around the legs in general but has been concerned about infection. I am actually going to go ahead and see about getting an antibiotic sent into the pharmacy today  although I am going to obtain a PCR culture as well. 01-07-2023 upon evaluation today patient appears to be doing well currently in regard to his wound. Has been tolerating the dressing changes without complication. He actually seems to be doing much better. He did have a PCR culture which showed Serratia as well and the prominent organisms here. Levaquin was actually a good option to treat the Serratia along with the other organisms that were problematic including E. coli. Nonetheless I think coupled with the topical Keystone antibiotics and the alginate he is really doing quite well today. 8/28; patient with severe bilateral lymphedema he has wounds bilaterally on his lower legs but they look a lot better today. We are using silver alginate and Keystone with Urgo K2 lite wraps. He comes in today with his wounds looking quite good. He was prescribed Levaquin last time I think he will complete this in a few days. 01-21-2023 upon evaluation today patient appears to be doing worse in regard to his swelling took his wrap off he tells me he believes on Saturday. With that being said his leg is significantly swollen since that time and unfortunately does not appear to be doing nearly as good as it was last time I saw him 2 weeks ago or even last week for that matter. I am very  concerned about this. 01-28-2023 upon evaluation today patient appears to be doing poorly currently in regard to his legs. He has been doing everything he says that we have recommended, he continues to use his lymphedema pumps 3 times a day, he has been doubling up on his fluid pills, he has been elevating his legs, and he tells me that he also has kept the wraps on and in fact he did have them on the day when he came in. Again that for feels the compression side of things. Nonetheless with everything going on here we still have not been able to get this completely under control. He continues to have issues with significant swelling of lower extremities and this is not limited to just his lower left portion of his legs but his thighs are fairly large as well. With that being said I really feel like that we have reached the extent of what we have to offer here at the wound care center I am wondering if we can see about making a referral for second opinion to [facility] to see if there is anything they could do to help him out at this point. It is really not much further from his home to there as it is from his home to here. Both are about an hour distance. He lives in [place]. [name], [name] ([id number]) [id number] Page 4 of 14 02-04-2023 upon evaluation today patient appears to be doing much better actually compared to last week's evaluation. This is actually significant improvement and overall I do not know exactly what he did different other than he tells me that he "has been taking his fluid pills every day instead of every other day. Obviously this is needed. I was under the understanding previously that he was taking this every day but I think he may also not been pumping every day like he was telling me but I cannot be sure this either way he does seem to be doing what he supposed to do at this point. 02-11-2023 upon evaluation today patient unfortunately  appears to be about as bad today as he appeared good last week. I am not sure  what happened other than the fact he did take his wrap off the right leg he tells me it got so tight that he could not feel his toes they are getting very numb. Subsequently he tells me that it was difficulty with driving therefore he took it off. With that being said I do believe that he may need to go to the ER for evaluation and treatment I feel he may need to be admitted to get some of the fluid off of his legs. I think that he is volume overloaded and at this point I do not think there is much working to be able to do to manage this outpatient we have tried pretty much everything that I am going to do. 02-18-23 upon evaluation today patient appears to be doing pretty well currently compared to last week this is actually better today. He is less weeping and the legs are actually significantly smaller. He tells me has been trying to stay in bed with his legs elevated more. 02-25-2023 upon evaluation today patient appears to be doing well currently in regard to his legs in general compared to where we have been. Fortunately I do not see any signs of active infection locally or systemically which is great news and in general I do believe that we are making good headway here towards complete closure. I am hopeful that he will continue to show signs of improvement as we progress going forward. He does seem to have been taking his fluid pills as well as elevating his legs and using lymphedema pumps on a regular basis. He is trying to stay out of the hospital. 03-04-2023 upon evaluation today patient appears to be doing well currently in regard to his wound. He has been tolerating the dressing changes without complication. Fortunately there does not appear to be any signs of active infection at this time. 03-11-2023 upon evaluation today patient appears to be doing well currently in regard to his wounds. He has been tolerating the  dressing changes without complication. Fortunately I do not see any signs of active infection at this time which is great news. 11/6; patient missed his clinic appointment last week. He took his wraps off at some point. Did not take his diuretics because he was nauseated last week. He comes in with massive bilateral edema which is lymphedema and probably some degree of systemic fluid overload. 11/13; patient with very poorly controlled lymphedema. We have been putting 4-layer equivalent wraps on him he says he is using his compression pumps twice a day. And elevating his legs nevertheless the edema control today is very poor. He has a substantial wound on the right medial lower leg also of the left medial and left posterior. We have been using alginate as a primary dressing or Aquacel Ag Electronic Signature(s) Signed: 04/02/2023 4:06:32 PM By: Baltazar Najjar MD Entered By: Baltazar Najjar on 04/01/2023 12:59:50 -------------------------------------------------------------------------------- Physical Exam Details Patient Name: Date of Service: Bastedo, [name] 04/01/2023 3:00 PM Medical Record Number: [id number] Patient Account Number: [id number] Date of Birth/Sex: Treating RN: 11-13-1945 (77 y.o. M) Primary Care Provider: [name] Other Clinician: Referring Provider: Treating Provider/Extender: Valentina Gu Weeks in Treatment: 87 Constitutional Patient is hypertensive.. Pulse regular and within target range for patient.Marland Kitchen Respirations regular, non-labored and within target range.. Temperature is normal and within the target range for the patient.Marland Kitchen Appears in no distress. Cardiovascular Dorsalis pedis pulses are palpable bilaterally. Notes Wound exam; substantial area on the right medial completely  nonviable surface but there is simply too much lymphedema for debridement. Similar areas on the left medial and left posterior calf. Electronic Signature(s) Signed:  04/02/2023 4:06:32 PM By: Baltazar Najjar MD Entered By: Baltazar Najjar on 04/01/2023 13:00:52 -------------------------------------------------------------------------------- Physician Orders Details Patient Name: Date of Service: [name], [name] 04/01/2023 3:00 PM Spoon, [name] ([id number]) [id number] Page 5 of 14 Medical Record Number: [id number] Patient Account Number: [id number] Date of Birth/Sex: Treating RN: 1946/01/23 (77 y.o. [name], [name] Primary Care Provider: [name] Other Clinician: Referring Provider: Treating Provider/Extender: Stark Klein in Treatment: 48 The following information was scribed by: [name] The information was scribed for: Baltazar Najjar Verbal / Phone Orders: No Diagnosis Coding ICD-10 Coding Code Description I87.333 Chronic venous hypertension (idiopathic) with ulcer and inflammation of bilateral lower extremity I89.0 Lymphedema, not elsewhere classified 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.818 Non-pressure chronic ulcer of other part of right lower leg with other specified severity L84 Corns and callosities Follow-up Appointments ppointment in 1 week. - Wednesday****extra time 61 minutes****Wednesday Dr. Leanord Hawking 04/08/2023 (already scheduled) Return A Return appointment in 3 weeks. - Wednesday Dr. Leanord Hawking (front office to schedule) Nurse Visit: - 04/15/2023 ******60 minutes***** Other: - take your fluid pills as prescribed. Anesthetic (In clinic) Topical Lidocaine 5% applied to wound bed Bathing/ Shower/ Hygiene May shower with protection but do not get wound dressing(s) wet. Protect dressing(s) with water repellant cover (for example, large plastic bag) or a cast cover and may then take shower. Edema Control - Orders / Instructions Segmental Compressive Device. Use the Segmental Compressive Device on leg(s) 2-3  times a day for 45 - 60 minutes. If wearing any wraps or hose, do not remove them. Continue exercising as instructed. - x3 a day Elevate legs to the level of the heart or above for 30 minutes daily and/or when sitting for 3-4 times a day throughout the day. Avoid standing for long periods of time. Exercise regularly Off-Loading Other: - When sitting please keep your feet up, keep legs at Heart Level or above. Use pillows behind calf to for comfort. Additional Orders / Instructions Follow Nutritious Diet - Try and increase protein intake. Goal of protein intake is 60g-100g. Lymphedema Treatment Plan - Exercise, Compression and Elevation Bilateral Lower Extremities Exercise daily as tolerated. (Walking, ROM, Calf Pumps and Toe Taps) Elevate legs 30 - 60 minutes at or above heart level at least 3 - 4 times daily as able/tolerated Avoid standing for long periods and elevate leg(s) parallel to the floor when sitting Use Pneumatic Compression Device on leg(s) 2-3 times a day for 45-60 minutes - x3 a day Wound Treatment Wound #12 - Lower Leg Wound Laterality: Left, Anterior Cleanser: Soap and Water 1 x Per Week/30 Days Discharge Instructions: May shower and wash wound with dial antibacterial soap and water prior to dressing change. Peri-Wound Care: Sween Lotion (Moisturizing lotion) 1 x Per Week/30 Days Discharge Instructions: Apply moisturizing lotion as directed Prim Dressing: Maxorb Extra CMC/Alginate Dressing, 4x4 (in/in) 1 x Per Week/30 Days ary Discharge Instructions: Apply to wound bed as instructed Secondary Dressing: ABD Pad, 8x10 1 x Per Week/30 Days Discharge Instructions: Apply over primary dressing as directed. Secondary Dressing: Zetuvit Plus 4x8 in 1 x Per Week/30 Days Discharge Instructions: Apply over primary dressing as directed. Secondary Dressing: OptiLock Super Absorbent, 5x5.5 (in/in) (Generic) 1 x Per Week/30 Days Discharge Instructions: Apply directly to wound bed as  directed  Compression Wrap: Urgo K2, (equivalent to a 4 layer) two layer compression system, regular 1 x Per Week/30 Days Discharge Instructions: Apply Urgo K2 as directed (alternative to 4 layer compression). Wound #15 - Lower Leg Wound Laterality: Left, Posterior, Distal Cleanser: Soap and Water 1 x Per Week/30 Days Schroll, [name] ([id number]) [id number] Page 6 of 14 Discharge Instructions: May shower and wash wound with dial antibacterial soap and water prior to dressing change. Peri-Wound Care: Sween Lotion (Moisturizing lotion) 1 x Per Week/30 Days Discharge Instructions: Apply moisturizing lotion as directed Prim Dressing: Maxorb Extra CMC/Alginate Dressing, 4x4 (in/in) 1 x Per Week/30 Days ary Discharge Instructions: Apply to wound bed as instructed Secondary Dressing: ABD Pad, 8x10 1 x Per Week/30 Days Discharge Instructions: Apply over primary dressing as directed. Secondary Dressing: Zetuvit Plus 4x8 in 1 x Per Week/30 Days Discharge Instructions: Apply over primary dressing as directed. Secondary Dressing: OptiLock Super Absorbent, 5x5.5 (in/in) (Generic) 1 x Per Week/30 Days Discharge Instructions: Apply directly to wound bed as directed Compression Wrap: Urgo K2, (equivalent to a 4 layer) two layer compression system, regular 1 x Per Week/30 Days Discharge Instructions: Apply Urgo K2 as directed (alternative to 4 layer compression). Wound #16 - Lower Leg Wound Laterality: Left, Lateral Cleanser: Soap and Water 1 x Per Week/30 Days Discharge Instructions: May shower and wash wound with dial antibacterial soap and water prior to dressing change. Peri-Wound Care: Sween Lotion (Moisturizing lotion) 1 x Per Week/30 Days Discharge Instructions: Apply moisturizing lotion as directed Prim Dressing: Maxorb Extra CMC/Alginate Dressing, 4x4 (in/in) 1 x Per Week/30 Days ary Discharge Instructions: Apply to wound bed as instructed Secondary Dressing: ABD Pad,  8x10 1 x Per Week/30 Days Discharge Instructions: Apply over primary dressing as directed. Secondary Dressing: Zetuvit Plus 4x8 in 1 x Per Week/30 Days Discharge Instructions: Apply over primary dressing as directed. Secondary Dressing: OptiLock Super Absorbent, 5x5.5 (in/in) (Generic) 1 x Per Week/30 Days Discharge Instructions: Apply directly to wound bed as directed Compression Wrap: Urgo K2, (equivalent to a 4 layer) two layer compression system, regular 1 x Per Week/30 Days Discharge Instructions: Apply Urgo K2 as directed (alternative to 4 layer compression). Wound #6 - Lower Leg Wound Laterality: Right, Medial Cleanser: Soap and Water 1 x Per Week/30 Days Discharge Instructions: May shower and wash wound with dial antibacterial soap and water prior to dressing change. Peri-Wound Care: Sween Lotion (Moisturizing lotion) 1 x Per Week/30 Days Discharge Instructions: Apply moisturizing lotion as directed Prim Dressing: Maxorb Extra CMC/Alginate Dressing, 4x4 (in/in) 1 x Per Week/30 Days ary Discharge Instructions: Apply to wound bed as instructed Secondary Dressing: ABD Pad, 8x10 1 x Per Week/30 Days Discharge Instructions: Apply over primary dressing as directed. Secondary Dressing: Zetuvit Plus 4x8 in 1 x Per Week/30 Days Discharge Instructions: Apply over primary dressing as directed. Secondary Dressing: OptiLock Super Absorbent, 5x5.5 (in/in) (Generic) 1 x Per Week/30 Days Discharge Instructions: Apply directly to wound bed as directed Compression Wrap: Urgo K2, (equivalent to a 4 layer) two layer compression system, regular 1 x Per Week/30 Days Discharge Instructions: Apply Urgo K2 as directed (alternative to 4 layer compression). Wound #7 - Lower Leg Wound Laterality: Left, Medial Cleanser: Soap and Water 1 x Per Week/30 Days Discharge Instructions: May shower and wash wound with dial antibacterial soap and water prior to dressing change. Peri-Wound Care: Sween Lotion  (Moisturizing lotion) 1 x Per Week/30 Days Discharge Instructions: Apply moisturizing lotion as directed Prim Dressing: Maxorb Extra CMC/Alginate Dressing, 4x4 (in/in) 1 x  Per Week/30 Days ary Discharge Instructions: Apply to wound bed as instructed Secondary Dressing: ABD Pad, 8x10 1 x Per Week/30 Days Discharge Instructions: Apply over primary dressing as directed. Ticer, [name] ([id number]) [id number] Page 7 of 14 Secondary Dressing: Zetuvit Plus 4x8 in 1 x Per Week/30 Days Discharge Instructions: Apply over primary dressing as directed. Secondary Dressing: OptiLock Super Absorbent, 5x5.5 (in/in) (Generic) 1 x Per Week/30 Days Discharge Instructions: Apply directly to wound bed as directed Compression Wrap: Urgo K2, (equivalent to a 4 layer) two layer compression system, regular 1 x Per Week/30 Days Discharge Instructions: Apply Urgo K2 as directed (alternative to 4 layer compression). Electronic Signature(s) Signed: 04/01/2023 4:55:38 PM By: [name] RN, [name] Signed: 04/02/2023 4:06:32 PM By: Baltazar Najjar MD Entered By: [name] on 04/01/2023 12:54:54 -------------------------------------------------------------------------------- Problem List Details Patient Name: Date of Service: [name], [name] 04/01/2023 3:00 PM Medical Record Number: [id number] Patient Account Number: [id number] Date of Birth/Sex: Treating RN: [birth year] (77 y.o. [name] Primary Care Provider: [name] Other Clinician: Referring Provider: Treating Provider/Extender: Valentina Gu Weeks in Treatment: 50 Active Problems ICD-10 Encounter Code Description Active Date MDM Diagnosis I87.333 Chronic venous hypertension (idiopathic) with ulcer and inflammation of 06/11/2022 No Yes bilateral lower extremity I89.0 Lymphedema, not elsewhere classified 03/26/2022 No Yes L97.828 Non-pressure chronic ulcer of other part of left lower leg with  other specified 03/26/2022 No Yes severity L97.818 Non-pressure chronic ulcer of other part of right lower leg with other specified 03/26/2022 No Yes severity L84 Corns and callosities 10/15/2022 No Yes Inactive Problems Resolved Problems Electronic Signature(s) Signed: 04/02/2023 4:06:32 PM By: Baltazar Najjar MD Entered By: Baltazar Najjar on 04/01/2023 12:58:25 [name], [name] ([id number]) [id number] Page 8 of 14 -------------------------------------------------------------------------------- Progress Note Details Patient Name: Date of Service: [name], [name] 04/01/2023 3:00 PM Medical Record Number: [id number] Patient Account Number: [id number] Date of Birth/Sex: Treating RN: 1945-12-06 (77 y.o. M) Primary Care Provider: [name] Other Clinician: Referring Provider: Treating Provider/Extender: Valentina Gu Weeks in Treatment: 66 Subjective History of Present Illness (HPI) ADMISSION 02/23/2020 Patient is a 77 year old man who lives in [place] who arrives accompanied by his wife. He has a history of chronic lymphedema and venous insufficiency in his bilateral lower legs which may have something to do that with having a history of DVT as well as being treated for prostate cancer. In any case he recently got compression pumps at home but compliance has been an issue here. He has compression stockings however they are probably not sufficient enough to control swelling. They tell us that things deteriorated for him in late August he was admitted to [facility] for 7 days. This was with cellulitis I think of his bilateral lower legs. Discharge he was noted to have wounds on his bilateral lower legs. He was discharged on Bactrim. They tried to get him home health through [facility] of course they declined him. His wife is been wrapping these applying some form of silver foam dressing. He has a history of  wounds before although nothing that would not heal with basic home topical dressings. He has 2 areas on the left medial, left anterior and left lateral and a smaller area on the right medial. All of these have considerable depth. Past medical history includes iron deficiency anemia, lymphedema followed by the rehab center at [facility] with lymphedema wraps I believe, DVT on chronic anticoagulation, prostate cancer, chronic venous insufficiency, hypertension. As mentioned  he has compression pumps but does not use them. ABIs in our clinic were noncompressible bilaterally 10/14; patient with severe bilateral lymphedema right greater than left. He came in with bilateral lower extremity wounds left greater than right. Even though the right side has more of the edema most of the wounds here almost closed on the right medial. He has 3 remaining wounds on the left We have been using silver alginate under 4-layer compression I have been trying to get him to be compliant with his external compression pumps 10/21; patient with 3 small wounds on the left leg and 1 on the right medial in the setting of severe lymphedema and chronic venous insufficiency. We have been using silver alginate under 4-layer compression he is using his external compression pumps twice a day 11/4; ARTERIAL STUDIES on the right show an ABI of 1.02 TBI of 0.858 with biphasic waveforms on the left 0.98 with a TBI of 0.55 and biphasic waveforms. Does not look like he has significant arterial disease. We are treating him for lymphedema he has compression pumps. He has punched-out areas on the left anterior left lateral and right medial lower extremities 11/11; after we obtained his arterial studies I put him in 4 layer compression. He is using his compression pumps probably once a day although I have asked him to do twice. Primary dressing to the wound is silver collagen he has severe lymphedema likely secondary to chronic venous  insufficiency. Wounds on the left lateral, left medial and left anterior and a small area on the right medial 12/2; the area on the right anterior lower leg has healed. We initially thought that the area medially had healed as well however when her discharge nurse came in she detected fluid in the wound simply opened up. This is actually worse than I remember this pain. The area on the left lateral potentially slightly smaller He is also complaining about pain in his left hand he says that this is actually been getting some better he has been using topical creams on this. She asked that I look at this 12/9 after last weeks issues we have 2 wounds one on the right medial lower leg and 1 on the left lateral. Both of these are in the same condition. I think because of thickened skin secondary to chronic lymphedema these wounds actually have depth of almost 0.8 cm. 12/16; the patient has 2 small but deep wounds one on the right medial and one on the left lateral. The right medial is actually the worst of these. He arrives in clinic today with absolutely terrible edema in the right leg apparently his 4-layer wrap fell down to just above his ankle he did not think about this he is apparently been continuing to use his compression pump twice a day. The left leg looks a lot better. 05/09/2020 upon evaluation today patient appears to be doing decently well in regard to his wounds. Everything is measuring smaller the right leg still has a little bit deeper wound in the left seems to be almost completely healed in my opinion I am very pleased in general with how things are progressing. He has a 4- layer compression wrap we have been using endoform today we will probably have to use collagen just based on the fact that we do not have endoform it is on order. 1/6; the patient's wound on the left lateral lower leg has healed. Still has 1 on the right medial. He has severe bilateral lymphedema right  greater than  left. Using compression pumps at home twice a day. 1/13; left lateral lower leg is still healed. He has a deep punched out rectangular shaped wound on the right medial calf. Looking down at this it appears that he is attempting to epithelialize around the edges of the wound and on the base as well. His edema is reasonably well controlled we have been using collagen with absolutely no effect 1/20; left lateral lower leg remains closed he has extremitease stockings. The area on the right medial calf I aggressively debrided last week measures larger but the surface looks better. We have been using Hydrofera Blue. We ran Oasis through his insurance but we have not seen the results of this 1/27; left lower leg wound with chronic venous insufficiency and secondary lymphedema. I did aggressive debridement on this last week the wound seems to have come in healthy looking surface using Hydrofera Blue. He was denied for Oasis 2/3; small divot in the right medial lower leg. Under illumination the walls of this divot are epithelialized however the base has slough which I removed with a curette we have been using Hydrofera Blue 2/10 small divot on the right medial lower leg pinpoint illumination at the base of this cone-shaped wound. We have been using Hydrofera Blue but I will switch to calcium alginate this week 2/17; the small divot on the right medial lower leg is fully epithelialized. There is no visible open area under illumination. He has his own stocking for the right leg similar to the one he has been wearing on the left. 03/26/2022; READMISSION This is a now 77 year old man that we had in the clinic from 02/23/2020 through 07/05/2020. At that point he had bilateral lower extremity wounds left greater than right in the setting of severe lymphedema. He had already obtained compression pumps ordered for him I think from the wound care clinic in [facility] [name], [name] ([id number])  [phone].pdf Page 9 of 14 [place] so I do not really have record of what he has been using. He claims to be using them once a day but there is a problem with the sleeve on the left leg. About 2 weeks ago he was hospitalized from 03/11/2022 through 03/14/2022 with diastolic congestive heart failure. 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 He was diuresed. Developed some prerenal azotemia and he has not been taking any diuretics currently. He has not been putting stockings on his legs since he got out of hospital and still has his legs dependent for long periods. Past medical history history of prostate cancer treated with prostatectomy and radiation this was apparently about 8 years ago, history of DVT on chronic Coumadin, history of lymphedema was managed for a while at the clinic in [place]. History of inguinal hernia repair in September 22, hypertension, stage IIIb chronic renal failure ABIs today were noncompressible on the right 1.12 on the left 04-02-2022 upon evaluation today patient appears to be doing well currently in regard to his legs I do feel like both areas that are draining are actually much drier than they were in the picture last week although the left is drier than the right. He is tolerating the 4-layer compression wraps at this point he did contact the pump company and they are actually working on getting him a new compression sleeve for one of his legs which have previously popped and was not functioning properly. 04-23-2022 upon evaluation today patient appears to be  doing well currently in regard to his wounds on the legs. I am actually very pleased with where things stand and I do feel like that we are headed in the right direction. Fortunately there is no sign of active infection locally or systemically at this time. 05-07-2022 upon evaluation today patient appears to be doing well currently in  regard to his wounds in fact things are showing signs of improvement which is good news I do not see too much that actually appears to be open and I am very pleased in that regard. No fevers, chills, nausea, vomiting, or diarrhea. 05-21-2022 upon evaluation today patient appears to be doing somewhat poorly in regard to drainage of his lower extremities bilaterally. The right is greater than left as far as the weeping area. Nonetheless it seems to be getting worse not better. He actually has pitting edema which is at least 2+ to the thighs and I am concerned about the fact that he is may be fluid overloaded in general and that is the reason why we cannot get this under control. I know he is not using his pumps all the time because he actually told the nurse that he was either going to pump or he was going to use his fluid pills but not do both. For that reason I do think that he needs to be really doing both in order to get the fluid out as effectively as possible obviously with the 4-layer compression wraps were doing as much as we can from a compression standpoint but it is really not enough. He tells me that he elevates his leg is much as he can in between pumping and other activity throughout the day. 05-28-2022 upon evaluation today patient appears to be doing better in regard to his wounds although the measurements may be a little bit larger this is a very difficult wound to heal it is very indistinct in a lot of areas. Nonetheless there is can be some need for sharp debridement in regard to both medial and lateral legs. Fortunately I see no signs of active infection locally nor systemically at this time. No fevers, chills, nausea, vomiting, or diarrhea. 06-04-2022 upon evaluation today patient appears to be doing poorly in general in regard to the wounds on his legs. He still continues to have a tremendous amount of fluid not just in the lower portion of his leg but to be honest his thigh where he has  2-3+ pitting edema in the thigh as well. Unfortunately I do not know that we will be able to get this healed effectively and keep it healed on the lower extremities unless he gets the overall fluid situation taking and under control. Fortunately I do not see any signs of infection locally nor systemically which is great news. He just seems to be very fluid overloaded. 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 He has been tolerating the dressing changes with the compression wraps without complication. Fortunately I do not see any evidence of infection at this time which is great news. No fevers, chills, nausea, vomiting, or diarrhea. 06-18-2022 upon evaluation today patient appears to be doing well currently in regard to his wounds as far as not looking like they are terribly infected but nonetheless I am concerned about a subacute infection secondary to the fact that he continues to have spreading despite the compression therapy. We actually did do an Radio broadcast assistant on him  last week this is actually the first wrap that actually stayed up everything else has been sliding down quite significantly. Fortunately there does not appear to be any signs of infection systemically at this time. With that being said I do believe that locally there seems to be an issue going on here and again I Ernie Hew do a PCR culture to see what that shows also think that I am going to put him on a broad-spectrum antibiotic, doxycycline to see how that will help as well. He does tell me that coming into the clinic today that he was feeling short of breath like "he was about to have a heart attack" because he was having such a hard time breathing. He says that he told this to Dr. Jodelle Green his cardiologist as well when he was evaluated in the past 1 to 2 weeks. 07-02-2022 upon evaluation today patient appears to be doing poorly  currently in regard to his wound. He has been tolerating the dressing changes. Unfortunately he has not had any compression wraps on for the past week because he was unable to make it in for his appointment last week. With that being said he has a significant amount of drainage he tells me has been using his pumps but despite this in the pumps he still has been draining quite a bit. The drainage is also somewhat purulent unfortunately. We did attempt to get in touch with his cardiologist last week unfortunately we were unable to get up with him I did advise that the patient needs to get in touch with him upon leaving today in order to make sure they know he is on the new antibiotics I am going to send him this will be Levaquin and Augmentin. 07-09-2022 upon evaluation today patient appears to be doing about the same in regard to his legs he may have just a slight amount of improvement with regard to the drainage probably Keystone topical antibiotics are helping in this regard to some degree. Fortunately there does not appear to be any signs of active infection systemically which is great news. No fevers, chills, nausea, vomiting, or diarrhea. 07-16-2022 upon evaluation today patient appears to be doing well currently in regard to his wound. He has been tolerating the dressing changes without complication. Fortunately there does not appear to be any signs of active infection locally nor systemically at this time. With that being said he cannot keep the wraps up he tells me on the left side he had to cut this down because it got too tight. He has been using his pumps but he is on the right side the wrap actually straight down causing some pushing around the central part of his leg just below the calf I think this is a bigger risk for him that help at this point. I think that we may need to try something different he should be getting his compression socks shortly he tells me they were ordered last  Thursday. 3/6; ; this is a patient who lives in [place]. He has severe bilateral lymphedema. He has compression pumps, we have been using kerlix Ace wrap Keystone. He is changing the dressing. We do not have home health. 08-06-2022 upon evaluation today patient appears to be doing a little better in regard to his wounds in general at this point. Fortunately there does not appear to be any signs of active infection locally nor systemically at this time which is great news and overall I am extremely pleased with where  we stand today. 08-13-2022 upon evaluation today patient appears to actually be doing significantly better compared to last week. He actually did go to the hospital I told him that he needed to when he left here and he actually did go. With that being said they actually ended up admitting him he was having shortness of breath and I thought it might be related to congestive heart failure turns out he actually had a pulmonary embolism. Subsequently they were able to get him off of the Coumadin switching over to Eliquis to get things stabilized in that regard they also had them wrapped and got his swelling under control on his legs he actually looks much better pretty much across the board at this point. I am very pleased in that regard. With that being said I am very happy that he finally went that could have been a very dangerous situation. 08-20-2022 upon evaluation today patient appears to be doing well currently in regard to his wound. Has been tolerating the dressing changes without complication. Fortunately there does not appear to be any signs of active infection locally nor systemically at this time. I think his legs are doing better there is some need for sharp debridement today. 08-27-2022 upon evaluation patient is actually making excellent progress. I am actually very pleased with where he stands and I think that he is moving in the right direction. In general I think that we are  looking pretty good at the moment. 09-03-2022 upon evaluation today patient appears to be doing well currently in regard to his wound. He is actually tolerating dressing changes on the left and [name], [name] ([id number]) [phone].pdf Page 10 of 14 right leg without complication. Fortunately I do not see any need for debridement of the left leg the right leg I think we probably do some need to perform some debridement here. 09-10-2022 upon evaluation today patient's wounds actually showed signs of improvement in both legs I do not see much is going require debridement today which was great news. Fortunately I do not see any evidence of infection which I think is also excellent he seems to be using his pumps and doing everything right I am happy about how this is progressing at this point. 09-17-2022 upon evaluation today patient appears to be doing decently well in regard to his wounds. He has been tolerating the dressing changes without complication. Fortunately there does not appear to be any signs of active infection at this time which is good news. 09-24-2022 upon evaluation today patient appears to be doing well currently in regard to his wounds. He has been tolerating the dressing changes without complication. Fortunately there does not appear to be any signs of active infection locally nor systemically which is great news. No fevers, chills, nausea, vomiting, or diarrhea. 10-08-2022 upon evaluation today patient appears to be doing excellent currently in regard to his wound. He has been tolerating the dressing changes without complication though it does not sound like he has been using his compression wraps for a bit here. He does think he was doing better with the [facility] topical antibiotics we can definitely go back to using that but I think the biggest issue here is that his swelling is just very out of control and needs to be under control. I discussed that with him  today. 10-15-2022 upon evaluation today patient appears to be doing well currently in regard to his wounds. He is actually making some progress which is good news. Fortunately I do  not see any evidence of active infection locally nor systemically which is great news as well. No fevers, chills, nausea, vomiting, or diarrhea. He does have a callused area on the plantar aspect of his left foot which is actually causing some pain and he wonders if I can trim this down for him. 10-22-2022 upon evaluation today patient seems to be making progress. He is actually doing quite well and very pleased in that regard. I do not see any signs of active infection at this time. 11-05-2022 upon evaluation patient appears to be making progress although slowly towards closure. He seems to be doing well with regard to his legs were still using the [facility] topical antibiotics and he seems to be doing quite well. He is going require some sharp debridement today. 11-19-2022 upon evaluation today patient unfortunately appears to be extremely swollen at this point. He tells me that he ran out of supplies he also tells me his leg started leaking more because of not having supplies he was unable to wear his compression wraps that is the juxta fit compression wraps. Therefore his legs are extremely swollen much larger than normal and do not appear to be doing well at all today. He is going require some debridement I also think he is going require [place] to perform compression wrapping today. 11-26-2022 upon evaluation today patient appears to be doing well currently in regard to his legs as far as infection is concerned I see nothing that appears to be infected. Fortunately I do not see any signs of active infection systemically either which is also good news. With that being said he still is extremely swollen as far as his legs are concerned. I do not see any signs of overall worsening but also do not see any signs of significant  improvement which is the major issue here. 12-03-2022 upon evaluation today patient appears to be doing poorly still in regard to his legs the apparently has been taken off the wraps and not leaving the week by week. With that being said he has not had really the dressings to put on the release that is running out and subsequently though he is using his wraps I am not sure that he has been keeping them on like he needs to. I explained to him I really wanted to have the wraps that I put on him left on until he comes back to see me so that we can keep the compression under better control. He voiced understanding today he tells me that he was "confused about that". Nonetheless based on what I am seeing right now also think that he is up on his feet too much I think he needs to get the feet elevated. 7/24; this is a patient with severe bilateral stage III lymphedema. On the right lower leg medially is a large area of macerated denuded skin was too small deeper areas in this. On the left leg he is 2 areas that are a little more standard in terms of wounds. Massive lymphedema bilaterally. He has compression pumps at home which he uses for 1 hour twice a day 12-17-2022 upon evaluation today patient's legs though a little bit smaller still continue to have significant issues with weeping here this just does not dry. I really think he needs to be changing this daily and this means we will probably have to try to go back to the Velcro wraps and give this a shot. I think may be going to the Velcro wraps,  changing the dressing to a superabsorber like XtraSorb or the like, and have him elevate his legs and do his pumps 3 times per day is probably going to be the way to go to try to see if we can make some improvement here. 12-24-2022 upon evaluation today patient unfortunately is still struggling to get anything under control. We have been extremely aggressive and trying to: Control his swelling everything we do  however seems to be met with resistance. He tells me that he is pumping 3 times per day with his lymphedema pumps. He also tells me that he is try to elevate his legs is much as possible and that subsequently he has been keeping his compression wraps on. He goes back and forth between saying that he can keep our wraps on and not but right now I really do not see that he is doing well with the juxta fit is at least not from the standpoint of improving the overall appearance I think it does to some degree help maintain but we are still struggling here. I have voiced a concern to the patient that despite everything you are doing I am still not seeing a lot of improvement here and to be honest I am not sure what is can take get this under control he may have to go into the hospital for admission. 12-31-2022 upon evaluation today patient appears to be doing well currently in regard to his wounds. They seem a little better although he has several pustules around the legs in general but has been concerned about infection. I am actually going to go ahead and see about getting an antibiotic sent into the pharmacy today although I am going to obtain a PCR culture as well. 01-07-2023 upon evaluation today patient appears to be doing well currently in regard to his wound. Has been tolerating the dressing changes without complication. He actually seems to be doing much better. He did have a PCR culture which showed Serratia as well and the prominent organisms here. Levaquin was actually a good option to treat the Serratia along with the other organisms that were problematic including E. coli. Nonetheless I think coupled with the topical Keystone antibiotics and the alginate he is really doing quite well today. 8/28; patient with severe bilateral lymphedema he has wounds bilaterally on his lower legs but they look a lot better today. We are using silver alginate and Keystone with Urgo K2 lite wraps. He comes in today  with his wounds looking quite good. He was prescribed Levaquin last time I think he will complete this in a few days. 01-21-2023 upon evaluation today patient appears to be doing worse in regard to his swelling took his wrap off he tells me he believes on Saturday. With that being said his leg is significantly swollen since that time and unfortunately does not appear to be doing nearly as good as it was last time I saw him 2 weeks ago or even last week for that matter. I am very concerned about this. 01-28-2023 upon evaluation today patient appears to be doing poorly currently in regard to his legs. He has been doing everything he says that we have recommended, he continues to use his lymphedema pumps 3 times a day, he has been doubling up on his fluid pills, he has been elevating his legs, and he tells me that he also has kept the wraps on and in fact he did have them on the day when he came in. Again  that for feels the compression side of things. Nonetheless with everything going on here we still have not been able to get this completely under control. He continues to have issues with significant swelling of lower extremities and this is not limited to just his lower left portion of his legs but his thighs are fairly large as well. With that being said I really feel like that we have reached the extent of what we have to offer here at the wound care center I am wondering if we can see about making a referral for second opinion to [facility] to see if there is anything they could do to help him out at this point. It is really not much further from his home to there as it is from his home to here. Both are about an hour distance. He lives in [place]. 02-04-2023 upon evaluation today patient appears to be doing much better actually compared to last week's evaluation. This is actually significant improvement and overall I do not know exactly what he did different other than he tells me that he  "has been taking his fluid pills every day instead of every other day. Obviously this is needed. I was under the understanding previously that he was taking this every day but I think he may also not been pumping every day like he was telling me but I cannot be sure this either way he does seem to be doing what he supposed to do at this point. [name], [name] ([id number]) [id number] Page 11 of 14 02-11-2023 upon evaluation today patient unfortunately appears to be about as bad today as he appeared good last week. I am not sure what happened other than the fact he did take his wrap off the right leg he tells me it got so tight that he could not feel his toes they are getting very numb. Subsequently he tells me that it was difficulty with driving therefore he took it off. With that being said I do believe that he may need to go to the ER for evaluation and treatment I feel he may need to be admitted to get some of the fluid off of his legs. I think that he is volume overloaded and at this point I do not think there is much working to be able to do to manage this outpatient we have tried pretty much everything that I am going to do. 02-18-23 upon evaluation today patient appears to be doing pretty well currently compared to last week this is actually better today. He is less weeping and the legs are actually significantly smaller. He tells me has been trying to stay in bed with his legs elevated more. 02-25-2023 upon evaluation today patient appears to be doing well currently in regard to his legs in general compared to where we have been. Fortunately I do not see any signs of active infection locally or systemically which is great news and in general I do believe that we are making good headway here towards complete closure. I am hopeful that he will continue to show signs of improvement as we progress going forward. He does seem to have been taking his fluid pills as well as  elevating his legs and using lymphedema pumps on a regular basis. He is trying to stay out of the hospital. 03-04-2023 upon evaluation today patient appears to be doing well currently in regard to his wound. He has been tolerating the dressing changes without complication. Fortunately there does not appear  to be any signs of active infection at this time. 03-11-2023 upon evaluation today patient appears to be doing well currently in regard to his wounds. He has been tolerating the dressing changes without complication. Fortunately I do not see any signs of active infection at this time which is great news. 11/6; patient missed his clinic appointment last week. He took his wraps off at some point. Did not take his diuretics because he was nauseated last week. He comes in with massive bilateral edema which is lymphedema and probably some degree of systemic fluid overload. 11/13; patient with very poorly controlled lymphedema. We have been putting 4-layer equivalent wraps on him he says he is using his compression pumps twice a day. And elevating his legs nevertheless the edema control today is very poor. He has a substantial wound on the right medial lower leg also of the left medial and left posterior. We have been using alginate as a primary dressing or Aquacel Ag Objective Constitutional Patient is hypertensive.. Pulse regular and within target range for patient.Marland Kitchen Respirations regular, non-labored and within target range.. Temperature is normal and within the target range for the patient.Marland Kitchen Appears in no distress. Vitals Time Taken: 3:45 PM, Height: 74 in, Weight: 250 lbs, BMI: 32.1, Temperature: 98.8 F, Pulse: 91 bpm, Respiratory Rate: 18 breaths/min, Blood Pressure: 177/67 mmHg. Cardiovascular Dorsalis pedis pulses are palpable bilaterally. General Notes: Wound exam; substantial area on the right medial completely nonviable surface but there is simply too much lymphedema for debridement. Similar  areas on the left medial and left posterior calf. Integumentary (Hair, Skin) Wound #12 status is Open. Original cause of wound was Frostbite. The date acquired was: 12/17/2022. The wound has been in treatment 15 weeks. The wound is located on the Left,Anterior Lower Leg. The wound measures 1.5cm length x 3.4cm width x 0.1cm depth; 4.006cm^2 area and 0.401cm^3 volume. There is Fat Layer (Subcutaneous Tissue) exposed. There is no tunneling or undermining noted. There is a large amount of serous drainage noted. The wound margin is distinct with the outline attached to the wound base. There is medium (34-66%) red, pink granulation within the wound bed. There is a medium (34-66%) amount of necrotic tissue within the wound bed including Adherent Slough. The periwound skin appearance exhibited: Hemosiderin Staining. The periwound skin appearance did not exhibit: Callus, Crepitus, Excoriation, Induration, Rash, Scarring, Dry/Scaly, Maceration, Atrophie Blanche, Cyanosis, Ecchymosis, Mottled, Pallor, Rubor, Erythema. Periwound temperature was noted as No Abnormality. Wound #15 status is Open. Original cause of wound was Gradually Appeared. The date acquired was: 01/14/2023. The wound has been in treatment 6 weeks. The wound is located on the Left,Distal,Posterior Lower Leg. The wound measures 6cm length x 5.5cm width x 0.1cm depth; 25.918cm^2 area and 2.592cm^3 volume. There is Fat Layer (Subcutaneous Tissue) exposed. There is no tunneling or undermining noted. There is a medium amount of serosanguineous drainage noted. The wound margin is distinct with the outline attached to the wound base. There is small (1-33%) pink granulation within the wound bed. There is a large (67-100%) amount of necrotic tissue within the wound bed including Adherent Slough. The periwound skin appearance exhibited: Hemosiderin Staining. The periwound skin appearance did not exhibit: Callus, Crepitus, Excoriation, Induration, Rash,  Scarring, Dry/Scaly, Maceration, Atrophie Blanche, Cyanosis, Ecchymosis, Mottled, Pallor, Rubor, Erythema. Wound #16 status is Open. Original cause of wound was Blister. The date acquired was: 04/01/2023. The wound is located on the Left,Lateral Lower Leg. The wound measures 2.6cm length x 2.7cm width x 0.1cm depth;  5.513cm^2 area and 0.551cm^3 volume. There is no tunneling or undermining noted. There is a medium amount of serous drainage noted. The wound margin is distinct with the outline attached to the wound base. There is large (67-100%) red granulation within the wound bed. There is no necrotic tissue within the wound bed. The periwound skin appearance did not exhibit: Callus, Crepitus, Excoriation, Induration, Rash, Scarring, Dry/Scaly, Maceration, Atrophie Blanche, Cyanosis, Ecchymosis, Hemosiderin Staining, Mottled, Pallor, Rubor, Erythema. Wound #6 status is Open. Original cause of wound was Gradually Appeared. The date acquired was: 03/05/2022. The wound has been in treatment 53 weeks. The wound is located on the Right,Medial Lower Leg. The wound measures 15cm length x 16cm width x 0.4cm depth; 188.496cm^2 area and 75.398cm^3 volume. There is Fat Layer (Subcutaneous Tissue) exposed. There is no tunneling or undermining noted. There is a large amount of serous drainage noted. The wound margin is distinct with the outline attached to the wound base. There is small (1-33%) pink granulation within the wound bed. There is a large (67-100%) amount of necrotic tissue within the wound bed including Adherent Slough. The periwound skin appearance exhibited: Scarring, Hemosiderin Staining. The periwound skin appearance did not exhibit: Callus, Crepitus, Excoriation, Induration, Rash, Dry/Scaly, Maceration, Atrophie Blanche, Cyanosis, Ecchymosis, Mottled, Pallor, Rubor, Erythema. Periwound temperature was noted as No Abnormality. Wound #7 status is Open. Original cause of wound was Gradually Appeared.  The date acquired was: 03/05/2022. The wound has been in treatment 53 weeks. The wound is located on the Left,Medial Lower Leg. The wound measures 6cm length x 7.5cm width x 0.1cm depth; 35.343cm^2 area and 3.534cm^3 volume. There is Fat Layer (Subcutaneous Tissue) exposed. There is no tunneling or undermining noted. There is a large amount of serous drainage noted. The wound margin is distinct with the outline attached to the wound base. There is small (1-33%) red granulation within the wound bed. There is a large (67-100%) amount of necrotic tissue within the wound bed including Eschar. The periwound skin appearance exhibited: Scarring, Hemosiderin Staining. The periwound skin appearance did not exhibit: Callus, Crepitus, Excoriation, Induration, Rash, Dry/Scaly, Maceration, Atrophie Blanche, Cyanosis, Ecchymosis, Mottled, Pallor, Rubor, Erythema. Periwound temperature was noted as No Abnormality. 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 There was a Double Layer Compression Therapy Procedure by [name], RN. Post procedure Diagnosis Wound #12: Same as Pre-Procedure Wound #15 Pre-procedure diagnosis of Wound #15 is a Lymphedema located on the Left,Distal,Posterior Lower Leg . There was a Double Layer Compression Therapy Procedure by [name], RN. Post procedure Diagnosis Wound #15: Same as Pre-Procedure Wound #16 Pre-procedure diagnosis of Wound #16 is a Venous  Leg Ulcer located on the Left,Lateral Lower Leg . There was a Double Layer Compression Therapy Procedure by [name], RN. Post procedure Diagnosis Wound #16: Same as Pre-Procedure Wound #6 Pre-procedure diagnosis of Wound #6 is a Lymphedema located on the Right,Medial Lower Leg . There was a Double Layer Compression Therapy Procedure by [name], RN. Post procedure Diagnosis Wound #6: Same as Pre-Procedure Wound #7 Pre-procedure diagnosis of Wound #7 is a Lymphedema located on the  Left,Medial Lower Leg . There was a Double Layer Compression Therapy Procedure by [name], RN. Post procedure Diagnosis Wound #7: Same as Pre-Procedure Plan Follow-up Appointments: Return Appointment in 1 week. - Wednesday****extra time 38 minutes****Wednesday Dr. Leanord Hawking 04/08/2023 (already scheduled) Return appointment in 3 weeks. - Wednesday Dr. Leanord Hawking (front office to schedule) Nurse Visit: - 04/15/2023 ******60 minutes***** Other: - take your fluid pills as prescribed. Anesthetic: (In clinic) Topical Lidocaine 5% applied to wound bed Bathing/ Shower/ Hygiene: May shower with protection but do not get wound dressing(s) wet. Protect dressing(s) with water repellant cover (for example, large plastic bag) or a cast cover and may then take shower. Edema Control - Orders / Instructions: Segmental Compressive Device. Use the Segmental Compressive Device on leg(s) 2-3 times a day for 45 - 60 minutes. If wearing any wraps or hose, do not remove them. Continue exercising as instructed. - x3 a day Elevate legs to the level of the heart or above for 30 minutes daily and/or when sitting for 3-4 times a day throughout the day. Avoid standing for long periods of time. Exercise regularly Off-Loading: Other: - When sitting please keep your feet up, keep legs at Heart Level or above. Use pillows behind calf to for comfort. Additional Orders / Instructions: Follow Nutritious Diet - Try and increase protein  intake. Goal of protein intake is 60g-100g. Lymphedema Treatment Plan - Exercise, Compression and Elevation: Exercise daily as tolerated. (Walking, ROM, Calf Pumps and T T oe aps) Elevate legs 30 - 60 minutes at or above heart level at least 3 - 4 times daily as able/tolerated Avoid standing for long periods and elevate leg(s) parallel to the floor when sitting Use Pneumatic Compression Device on leg(s) 2-3 times a day for 45-60 minutes - x3 a day WOUND #12: - Lower Leg Wound Laterality: Left, Anterior Cleanser: Soap and Water 1 x Per Week/30 Days Discharge Instructions: May shower and wash wound with dial antibacterial soap and water prior to dressing change. Peri-Wound Care: Sween Lotion (Moisturizing lotion) 1 x Per Week/30 Days Discharge Instructions: Apply moisturizing lotion as directed [name], [name] ([id number]) [id number] Page 13 of 14 Prim Dressing: Maxorb Extra CMC/Alginate Dressing, 4x4 (in/in) 1 x Per Week/30 Days ary Discharge Instructions: Apply to wound bed as instructed Secondary Dressing: ABD Pad, 8x10 1 x Per Week/30 Days Discharge Instructions: Apply over primary dressing as directed. Secondary Dressing: Zetuvit Plus 4x8 in 1 x Per Week/30 Days Discharge Instructions: Apply over primary dressing as directed. Secondary Dressing: OptiLock Super Absorbent, 5x5.5 (in/in) (Generic) 1 x Per Week/30 Days Discharge Instructions: Apply directly to wound bed as directed Com pression Wrap: Urgo K2, (equivalent to a 4 layer) two layer compression system, regular 1 x Per Week/30 Days Discharge Instructions: Apply Urgo K2 as directed (alternative to 4 layer compression). WOUND #15: - Lower Leg Wound Laterality: Left, Posterior, Distal Cleanser: Soap and Water 1 x Per Week/30 Days Discharge Instructions: May shower and wash wound with dial antibacterial soap and water prior to dressing change. Peri-Wound Care: Sween Lotion (Moisturizing lotion) 1 x Per  Week/30 Days Discharge Instructions: Apply moisturizing lotion as directed Prim Dressing: Maxorb Extra CMC/Alginate Dressing, 4x4 (in/in) 1 x Per Week/30 Days ary Discharge Instructions: Apply to wound bed as instructed Secondary Dressing: ABD Pad, 8x10 1 x Per Week/30 Days Discharge Instructions: Apply over primary dressing as directed. Secondary Dressing: Zetuvit Plus 4x8 in 1 x Per Week/30 Days Discharge Instructions: Apply over primary dressing as directed. Secondary Dressing: OptiLock Super Absorbent, 5x5.5 (  in/in) (Generic) 1 x Per Week/30 Days Discharge Instructions: Apply directly to wound bed as directed Com pression Wrap: Urgo K2, (equivalent to a 4 layer) two layer compression system, regular 1 x Per Week/30 Days Discharge Instructions: Apply Urgo K2 as directed (alternative to 4 layer compression). WOUND #16: - Lower Leg Wound Laterality: Left, Lateral Cleanser: Soap and Water 1 x Per Week/30 Days Discharge Instructions: May shower and wash wound with dial antibacterial soap and water prior to dressing change. Peri-Wound Care: Sween Lotion (Moisturizing lotion) 1 x Per Week/30 Days Discharge Instructions: Apply moisturizing lotion as directed Prim Dressing: Maxorb Extra CMC/Alginate Dressing, 4x4 (in/in) 1 x Per Week/30 Days ary Discharge Instructions: Apply to wound bed as instructed Secondary Dressing: ABD Pad, 8x10 1 x Per Week/30 Days Discharge Instructions: Apply over primary dressing as directed. Secondary Dressing: Zetuvit Plus 4x8 in 1 x Per Week/30 Days Discharge Instructions: Apply over primary dressing as directed. Secondary Dressing: OptiLock Super Absorbent, 5x5.5 (in/in) (Generic) 1 x Per Week/30 Days Discharge Instructions: Apply directly to wound bed as directed Com pression Wrap: Urgo K2, (equivalent to a 4 layer) two layer compression system, regular 1 x Per Week/30 Days Discharge Instructions: Apply Urgo K2 as directed (alternative to 4 layer  compression). WOUND #6: - Lower Leg Wound Laterality: Right, Medial Cleanser: Soap and Water 1 x Per Week/30 Days Discharge Instructions: May shower and wash wound with dial antibacterial soap and water prior to dressing change. Peri-Wound Care: Sween Lotion (Moisturizing lotion) 1 x Per Week/30 Days Discharge Instructions: Apply moisturizing lotion as directed Prim Dressing: Maxorb Extra CMC/Alginate Dressing, 4x4 (in/in) 1 x Per Week/30 Days ary Discharge Instructions: Apply to wound bed as instructed Secondary Dressing: ABD Pad, 8x10 1 x Per Week/30 Days Discharge Instructions: Apply over primary dressing as directed. Secondary Dressing: Zetuvit Plus 4x8 in 1 x Per Week/30 Days Discharge Instructions: Apply over primary dressing as directed. Secondary Dressing: OptiLock Super Absorbent, 5x5.5 (in/in) (Generic) 1 x Per Week/30 Days Discharge Instructions: Apply directly to wound bed as directed Com pression Wrap: Urgo K2, (equivalent to a 4 layer) two layer compression system, regular 1 x Per Week/30 Days Discharge Instructions: Apply Urgo K2 as directed (alternative to 4 layer compression). WOUND #7: - Lower Leg Wound Laterality: Left, Medial Cleanser: Soap and Water 1 x Per Week/30 Days Discharge Instructions: May shower and wash wound with dial antibacterial soap and water prior to dressing change. Peri-Wound Care: Sween Lotion (Moisturizing lotion) 1 x Per Week/30 Days Discharge Instructions: Apply moisturizing lotion as directed Prim Dressing: Maxorb Extra CMC/Alginate Dressing, 4x4 (in/in) 1 x Per Week/30 Days ary Discharge Instructions: Apply to wound bed as instructed Secondary Dressing: ABD Pad, 8x10 1 x Per Week/30 Days Discharge Instructions: Apply over primary dressing as directed. Secondary Dressing: Zetuvit Plus 4x8 in 1 x Per Week/30 Days Discharge Instructions: Apply over primary dressing as directed. Secondary Dressing: OptiLock Super Absorbent, 5x5.5 (in/in) (Generic)  1 x Per Week/30 Days Discharge Instructions: Apply directly to wound bed as directed Com pression Wrap: Urgo K2, (equivalent to a 4 layer) two layer compression system, regular 1 x Per Week/30 Days Discharge Instructions: Apply Urgo K2 as directed (alternative to 4 layer compression). 1. We had some discussion about letting him use his own juxta lite stockings however I think there is too much edema here for that. 2. If this was simply palliative care then I would consider the juxta lite stockings and his compression pumps 3. Aquacel Ag, secondary absorptive dressings Urgo K2  4-layer equivalent dressing and I have asked him to increase his compression pumps usage to 3 times a day 4. He simply has too much lymphedema now to do anything about the wound surfaces. Electronic Signature(s) Signed: 04/02/2023 4:06:32 PM By: Baltazar Najjar MD Entered By: Baltazar Najjar on 04/01/2023 13:02:09 Desir, [name] ([id number]) [id number] Page 14 of 14 -------------------------------------------------------------------------------- SuperBill Details Patient Name: Date of Service: [name], [name] 04/01/2023 Medical Record Number: [id number] Patient Account Number: [id number] Date of Birth/Sex: Treating RN: 03-24-1946 (77 y.o. [name], [name] Primary Care Provider: [name] Other Clinician: Referring Provider: Treating Provider/Extender: Valentina Gu Weeks in Treatment: 109 Diagnosis Coding ICD-10 Codes Code Description [phone] Chronic venous hypertension (idiopathic) with ulcer and inflammation of bilateral lower extremity I89.0 Lymphedema, not elsewhere classified 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.818 Non-pressure chronic ulcer of other part of right lower leg with other specified severity L84 Corns and callosities Facility Procedures : CPT4: Code [id number]  foo Description: 81 BILATERAL: Application of multi-layer venous compression system; leg (below knee), including ankle and t. Modifier: Quantity: 1 Physician Procedures : CPT4 Code Description Modifier [id number] - WC PHYS LEVEL 3 - EST PT ICD-10 Diagnosis Description I87.333 Chronic venous hypertension (idiopathic) with ulcer and inflammation of bilateral lower extremity I89.0 Lymphedema, not elsewhere classified  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.818 Non-pressure chronic ulcer of other part of right lower leg with other specified severity Quantity: 1 Electronic Signature(s) Signed: 04/02/2023 4:06:32 PM By: Baltazar Najjar MD Entered By: Baltazar Najjar on 04/01/2023 13:02:33

## 2023-04-03 NOTE — Progress Notes (Signed)
Carel, [name] ([id number]) [id number] Page 1 of 19 Visit Report for 04/01/2023 Arrival Information Details Patient Name: Date of Service: [name], [name] 04/01/2023 3:00 PM Medical Record Number: [id number] Patient Account Number: [id number] Date of Birth/Sex: Treating RN: 1946/04/28 (77 y.o. M) Primary Care Nayden Czajka: [name] Other Clinician: Referring Keyundra Fant: Treating Mattia Liford/Extender: Stark Klein in Treatment: 66 Visit Information History Since Last Visit Added or deleted any medications: No Patient Arrived: Cane Any new allergies or adverse reactions: No Arrival Time: 15:20 Had a fall or experienced change in No Accompanied By: self activities of daily living that may affect Transfer Assistance: None risk of falls: Patient Requires Transmission-Based Precautions: No Signs or symptoms of abuse/neglect since last visito No Patient Has Alerts: Yes Hospitalized since last visit: No Patient Alerts: Patient on Blood Thinner Implantable device outside of the clinic excluding No Right ABI in clinic [place] cellular tissue based products placed in the center since last visit: Has Dressing in Place as Prescribed: Yes Pain Present Now: No Electronic Signature(s) Signed: 04/03/2023 10:02:03 AM By: Karl Ito Entered By: Karl Ito on 04/01/2023 12:20:57 -------------------------------------------------------------------------------- Compression Therapy Details Patient Name: Date of Service: [name], [name] 04/01/2023 3:00 PM Medical Record Number: [id number] Patient Account Number: [id number] Date of Birth/Sex: Treating RN: April 02, 1946 (77 y.o. [name] Primary Care Elta Angell: [name] Other Clinician: Referring Atley Neubert: Treating Tran Randle/Extender: Valentina Gu Weeks in Treatment: 37 Compression Therapy Performed for Wound Assessment: Wound #12 Left,Anterior Lower  Leg Performed By: Clinician [name], RN Compression Type: Double Layer Post Procedure Diagnosis Same as Pre-procedure Electronic Signature(s) Signed: 04/01/2023 4:55:38 PM By: [name] RN, [name] Entered By: [name] on 04/01/2023 12:55:18 Dack, [name] ([id number]) [id number] Page 2 of 19 -------------------------------------------------------------------------------- Compression Therapy Details Patient Name: Date of Service: [name], [name] 04/01/2023 3:00 PM Medical Record Number: [id number] Patient Account Number: [id number] Date of Birth/Sex: Treating RN: [birth year] (77 y.o. [name] Primary Care Ramya Vanbergen: [name] Other Clinician: Referring Kimi Bordeau: Treating Anabel Lykins/Extender: Valentina Gu Weeks in Treatment: 30 Compression Therapy Performed for Wound Assessment: Wound #15 Left,Distal,Posterior Lower Leg Performed By: Clinician [name], RN Compression Type: Double Layer Post Procedure Diagnosis Same as Pre-procedure Electronic Signature(s) Signed: 04/01/2023 4:55:38 PM By: [name] RN, [name] Entered By: [name] on 04/01/2023 12:55:18 -------------------------------------------------------------------------------- Compression Therapy Details Patient Name: Date of Service: [name], [name] 04/01/2023 3:00 PM Medical Record Number: [id number] Patient Account Number: [id number] Date of Birth/Sex: Treating RN: 04-23-1946 (77 y.o. [name] Primary Care Omario [name]: [name] Other Clinician: Referring Ahmod Gillespie: Treating Kaiyah Eber/Extender: Valentina Gu Weeks in Treatment: 70 Compression Therapy Performed for Wound Assessment: Wound #6 Right,Medial Lower Leg Performed By: Clinician [name], RN Compression Type: Double Layer Post Procedure Diagnosis Same as Pre-procedure Electronic Signature(s) Signed: 04/01/2023 4:55:38 PM By: [name] RN,  [name] Entered By: [name] on 04/01/2023 12:55:18 -------------------------------------------------------------------------------- Compression Therapy Details Patient Name: Date of Service: [name], [name] 04/01/2023 3:00 PM Medical Record Number: [id number] Patient Account Number: [id number] Date of Birth/Sex: Treating RN: [birth year] (77 y.o. [name] Primary Care Breandan People: [name] Other Clinician: Referring Jak Haggar: Treating Reagan Behlke/Extender: Valentina Gu Weeks in Treatment: 73 Compression Therapy Performed for Wound Assessment: Wound #16 Left,Lateral Lower Leg Performed By: Clinician [name], RN Compression Type: Double Layer Post Procedure Diagnosis Same as Pre-procedure Electronic Signature(s) Signed: 04/01/2023 4:55:38 PM By: [name] RN, [name] [place], [name] ([id number]) PM  By: [name] RN, [name] [id number] Page 3 of 19 Signed: 04/01/2023 4:55:38 Entered By: [name] on 04/01/2023 12:55:18 -------------------------------------------------------------------------------- Compression Therapy Details Patient Name: Date of Service: [name], [name] 04/01/2023 3:00 PM Medical Record Number: [id number] Patient Account Number: [id number] Date of Birth/Sex: Treating RN: 02/06/1946 (77 y.o. [name] Primary Care Zayne Draheim: [name] Other Clinician: Referring Janal Haak: Treating Gao Mitnick/Extender: Valentina Gu Weeks in Treatment: 41 Compression Therapy Performed for Wound Assessment: Wound #7 Left,Medial Lower Leg Performed By: Clinician [name], RN Compression Type: Double Layer Post Procedure Diagnosis Same as Pre-procedure Electronic Signature(s) Signed: 04/01/2023 4:55:38 PM By: [name] RN, [name] Entered By: [name] on 04/01/2023 12:55:18 -------------------------------------------------------------------------------- Encounter Discharge  Information Details Patient Name: Date of Service: [name], [name] 04/01/2023 3:00 PM Medical Record Number: [id number] Patient Account Number: [id number] Date of Birth/Sex: Treating RN: 1945-10-03 (77 y.o. [name] Primary Care Zeah Germano: [name] Other Clinician: Referring Britian Jentz: Treating Micaella Gitto/Extender: Stark Klein in Treatment: 46 Encounter Discharge Information Items Discharge Condition: Stable Ambulatory Status: Cane Discharge Destination: Home Transportation: Private Auto Accompanied By: self Schedule Follow-up Appointment: Yes Clinical Summary of Care: Electronic Signature(s) Signed: 04/01/2023 4:55:38 PM By: [name] RN, [name] Entered By: [name] on 04/01/2023 12:56:14 -------------------------------------------------------------------------------- Lower Extremity Assessment Details Patient Name: Date of Service: [name], [name] 04/01/2023 3:00 PM Medical Record Number: [id number] Patient Account Number: [id number] Date of Birth/Sex: Treating RN: [birth year] (77 y.o. [name] Primary Care Ethridge Sollenberger: [name] Other Clinician: Jury, [name] ([id number]) [id number] Page 4 of 19 Referring Jovonne Wilton: Treating Arlyce Circle/Extender: Dominga Ferry, [name] Weeks in Treatment: 53 Edema Assessment Assessed: [Left: Yes] [Right: Yes] Edema: [Left: Yes] [Right: Yes] Calf Left: Right: Point of Measurement: 40 cm From Medial Instep 54 cm 54 cm Ankle Left: Right: Point of Measurement: 13 cm From Medial Instep 43 cm 44 cm Vascular Assessment Extremity colors, hair growth, and conditions: Extremity Color: [Left:Hyperpigmented] [Right:Hyperpigmented] Hair Growth on Extremity: [Left:No] Temperature of Extremity: [Left:Warm] [Right:Warm] Capillary Refill: [Left:> 3 seconds] [Right:> 3 seconds] Dependent Rubor: [Left:No Yes] [Right:No Yes] Toe Nail Assessment Left:  Right: Thick: Yes Yes Discolored: Yes Yes Deformed: Yes Yes Improper Length and Hygiene: No No Electronic Signature(s) Signed: 04/01/2023 4:55:38 PM By: [name] RN, [name] Entered By: [name] on 04/01/2023 12:48:04 -------------------------------------------------------------------------------- Multi Wound Chart Details Patient Name: Date of Service: [name], [name] 04/01/2023 3:00 PM Medical Record Number: [id number] Patient Account Number: [id number] Date of Birth/Sex: Treating RN: 04/21/1946 (77 y.o. M) Primary Care Trecia Maring: [name] Other Clinician: Referring Huzaifa Viney: Treating Kyllie Pettijohn/Extender: Valentina Gu Weeks in Treatment: 24 Vital Signs Height(in): 74 Pulse(bpm): 91 Weight(lbs): 250 Blood Pressure(mmHg): 177/67 Body Mass Index(BMI): 32.1 Temperature(F): 98.8 Respiratory Rate(breaths/min): 18 [12:Photos:] [name], [name] ([id number]) [12:Left, Anterior Lower Leg Wound Location: Frostbite Wounding Event: Lymphedema Primary Etiology: Anemia, Lymphedema, Deep Vein Comorbid History: Thrombosis, Hypertension, Received Thrombosis, Hypertension, Received Thrombosis, Hypertension, Received  Radiation 12/17/2022 Date Acquired: 15 Weeks of Treatment: Open Wound Status: No Wound Recurrence: No Clustered Wound: N/A Clustered Quantity: 1.5x3.4x0.1 Measurements L x W x D (cm) 4.006 A (cm) : rea 0.401 Volume (cm) : -13.40% % Reduction in Area:  43.30% % Reduction in Volume: Full Thickness Without Exposed Classification: Support Structures Large Exudate A mount: Serous Exudate Type: amber Exudate Color: Distinct, outline attached Wound Margin: Medium (34-66%) Granulation Amount: Red, Pink  Granulation Quality: Medium (34-66%) Necrotic Amount: Adherent Slough Necrotic Tissue: Fat Layer (Subcutaneous Tissue):  Yes Fat Layer (Subcutaneous Tissue): Yes Fascia: No Exposed Structures: Fascia: No Tendon: No Muscle: No Joint: No Bone: No Small  (1-33%)  Epithelialization: Excoriation: No Periwound Skin Texture: Induration: No Callus: No Crepitus: No Rash: No Scarring: No Maceration: No Periwound Skin Moisture: Dry/Scaly: No Hemosiderin Staining: Yes Periwound Skin Color: Atrophie Blanche: No  Cyanosis: No Ecchymosis: No Erythema: No Mottled: No Pallor: No Rubor: No No Abnormality Temperature: Compression Therapy Procedures Performed:] [15:Left, Distal, Posterior Lower Leg Gradually Appeared Lymphedema Anemia, Lymphedema, Deep Vein Radiation  01/14/2023 6 Open No No N/A 6x5.5x0.1 25.918 2.592 -193.30% -193.20% Full Thickness Without Exposed Support Structures Medium Serosanguineous red, brown Distinct, outline attached Small (1-33%) Pink Large (67-100%) Adherent Slough Fascia: No Tendon: No  Muscle: No Joint: No Bone: No Small (1-33%) Excoriation: No Induration: No Callus: No Crepitus: No Rash: No Scarring: No Maceration: No Dry/Scaly: No Hemosiderin Staining: Yes Atrophie Blanche: No Cyanosis: No Ecchymosis: No Erythema: No Mottled: No  Pallor: No Rubor: No N/A Compression Therapy] [id number] Page 5 of 19 Left, Lateral Lower Leg Blister Venous Leg Ulcer Anemia, Lymphedema, Deep Vein Radiation 04/01/2023 0 Open No No N/A 2.6x2.7x0.1 5.513 0.551 N/A N/A Full  Thickness Without Exposed Support Structures Medium Serous amber Distinct, outline attached Large (67-100%) Red None Present (0%) N/A Fat Layer (Subcutaneous Tissue): No Tendon: No Muscle: No Joint: No Bone: No Small (1-33%) Excoriation: No Induration:  No Callus: No Crepitus: No Rash: No Scarring: No Maceration: No Dry/Scaly: No Atrophie Blanche: No Cyanosis: No Ecchymosis: No Erythema: No Hemosiderin Staining: No Mottled: No Pallor: No Rubor: No N/A Compression Therapy] Wound Number: 6 7 N/A Photos: N/A Right, Medial Lower Leg Left, Medial Lower Leg N/A Wound Location: Gradually Appeared Gradually Appeared N/A Wounding Event: Lymphedema Lymphedema N/A Primary  Etiology: Anemia, Lymphedema, Deep Vein Anemia, Lymphedema, Deep Vein N/A Comorbid History: Thrombosis, Hypertension, Received Thrombosis, Hypertension, Received Radiation Radiation 03/05/2022 03/05/2022 N/A Date Acquired: 55 53 N/A Weeks of Treatment: Open Open N/A Wound Status: No No N/A Wound Recurrence: Yes Yes N/A Clustered Wound: 1 1 N/A Clustered Quantity: 15x16x0.4 6x7.5x0.1 N/A Measurements L x W x D (cm) 188.496 35.343 N/A A (cm) : rea 75.398 3.534 N/A Volume (cm) : -31.90% 25.00% N/A % Reduction in Area: -427.50% 25.00% N/A % Reduction in Volume: Full Thickness Without Exposed Full Thickness Without Exposed N/A Classification: Support Structures Support Structures Large Large N/A Exudate Amount: Serous Serous N/A Exudate Type: amber amber N/A Exudate Color: [name], [name] ([id number]) [id number] Page 6 of 19 Distinct, outline attached Distinct, outline attached N/A Wound Margin: Small (1-33%) Small (1-33%) N/A Granulation Amount: Pink Red N/A Granulation Quality: Large (67-100%) Large (67-100%) N/A Necrotic Amount: Adherent Slough Eschar N/A Necrotic Tissue: Fat Layer (Subcutaneous Tissue): Yes Fat Layer (Subcutaneous Tissue): Yes N/A Exposed Structures: Fascia: No Fascia: No Tendon: No Tendon: No Muscle: No Muscle: No Joint: No Joint: No Bone: No Bone: No Small (1-33%) Small (1-33%) N/A Epithelialization: Scarring: Yes Scarring: Yes N/A Periwound Skin Texture: Excoriation: No Excoriation: No Induration: No Induration: No Callus: No Callus: No Crepitus: No Crepitus: No Rash: No Rash: No Maceration: No Maceration: No N/A Periwound Skin Moisture: Dry/Scaly: No Dry/Scaly: No Hemosiderin Staining: Yes Hemosiderin Staining: Yes N/A Periwound Skin Color: Atrophie Blanche: No Atrophie Blanche: No Cyanosis: No Cyanosis: No Ecchymosis: No Ecchymosis: No Erythema: No Erythema: No Mottled:  No Mottled: No Pallor: No Pallor: No Rubor: No Rubor: No No Abnormality No Abnormality N/A Temperature: Compression Therapy Compression Therapy N/A Procedures Performed: Treatment Notes Wound #12 (  Lower Leg) Wound Laterality: Left, Anterior Cleanser Soap and Water Discharge Instruction: May shower and wash wound with dial antibacterial soap and water prior to dressing change. Peri-Wound Care Sween Lotion (Moisturizing lotion) Discharge Instruction: Apply moisturizing lotion as directed Topical Primary Dressing Maxorb Extra CMC/Alginate Dressing, 4x4 (in/in) Discharge Instruction: Apply to wound bed as instructed Secondary Dressing ABD Pad, 8x10 Discharge Instruction: Apply over primary dressing as directed. Zetuvit Plus 4x8 in Discharge Instruction: Apply over primary dressing as directed. OptiLock Super Absorbent, 5x5.5 (in/in) Discharge Instruction: Apply directly to wound bed as directed Secured With Compression Wrap Urgo K2, (equivalent to a 4 layer) two layer compression system, regular Discharge Instruction: Apply Urgo K2 as directed (alternative to 4 layer compression). Compression Stockings Add-Ons Wound #15 (Lower Leg) Wound Laterality: Left, Posterior, Distal Cleanser Soap and Water Discharge Instruction: May shower and wash wound with dial antibacterial soap and water prior to dressing change. Peri-Wound Care Sween Lotion (Moisturizing lotion) Discharge Instruction: Apply moisturizing lotion as directed Topical Defibaugh, Austine ([id number]) [id number] Page 7 of 19 Primary Dressing Maxorb Extra CMC/Alginate Dressing, 4x4 (in/in) Discharge Instruction: Apply to wound bed as instructed Secondary Dressing ABD Pad, 8x10 Discharge Instruction: Apply over primary dressing as directed. Zetuvit Plus 4x8 in Discharge Instruction: Apply over primary dressing as directed. OptiLock Super Absorbent, 5x5.5 (in/in) Discharge Instruction: Apply  directly to wound bed as directed Secured With Compression Wrap Urgo K2, (equivalent to a 4 layer) two layer compression system, regular Discharge Instruction: Apply Urgo K2 as directed (alternative to 4 layer compression). Compression Stockings Add-Ons Wound #16 (Lower Leg) Wound Laterality: Left, Lateral Cleanser Soap and Water Discharge Instruction: May shower and wash wound with dial antibacterial soap and water prior to dressing change. Peri-Wound Care Sween Lotion (Moisturizing lotion) Discharge Instruction: Apply moisturizing lotion as directed Topical Primary Dressing Maxorb Extra CMC/Alginate Dressing, 4x4 (in/in) Discharge Instruction: Apply to wound bed as instructed Secondary Dressing ABD Pad, 8x10 Discharge Instruction: Apply over primary dressing as directed. Zetuvit Plus 4x8 in Discharge Instruction: Apply over primary dressing as directed. OptiLock Super Absorbent, 5x5.5 (in/in) Discharge Instruction: Apply directly to wound bed as directed Secured With Compression Wrap Urgo K2, (equivalent to a 4 layer) two layer compression system, regular Discharge Instruction: Apply Urgo K2 as directed (alternative to 4 layer compression). Compression Stockings Add-Ons Wound #6 (Lower Leg) Wound Laterality: Right, Medial Cleanser Soap and Water Discharge Instruction: May shower and wash wound with dial antibacterial soap and water prior to dressing change. Peri-Wound Care Sween Lotion (Moisturizing lotion) Discharge Instruction: Apply moisturizing lotion as directed Topical Primary Dressing Maxorb Extra CMC/Alginate Dressing, 4x4 (in/in) Discharge Instruction: Apply to wound bed as instructed Secondary Dressing ABD Pad, 8x10 Israelson, Eon ([id number]) [id number] Page 8 of 19 Discharge Instruction: Apply over primary dressing as directed. Zetuvit Plus 4x8 in Discharge Instruction: Apply over primary dressing as directed. OptiLock Super  Absorbent, 5x5.5 (in/in) Discharge Instruction: Apply directly to wound bed as directed Secured With Compression Wrap Urgo K2, (equivalent to a 4 layer) two layer compression system, regular Discharge Instruction: Apply Urgo K2 as directed (alternative to 4 layer compression). Compression Stockings Add-Ons Wound #7 (Lower Leg) Wound Laterality: Left, Medial Cleanser Soap and Water Discharge Instruction: May shower and wash wound with dial antibacterial soap and water prior to dressing change. Peri-Wound Care Sween Lotion (Moisturizing lotion) Discharge Instruction: Apply moisturizing lotion as directed Topical Primary Dressing Maxorb Extra CMC/Alginate Dressing, 4x4 (in/in) Discharge Instruction: Apply to wound bed as instructed Secondary Dressing ABD Pad, 8x10 Discharge Instruction: Apply  over primary dressing as directed. Zetuvit Plus 4x8 in Discharge Instruction: Apply over primary dressing as directed. OptiLock Super Absorbent, 5x5.5 (in/in) Discharge Instruction: Apply directly to wound bed as directed Secured With Compression Wrap Urgo K2, (equivalent to a 4 layer) two layer compression system, regular Discharge Instruction: Apply Urgo K2 as directed (alternative to 4 layer compression). Compression Stockings Add-Ons Electronic Signature(s) Signed: 04/02/2023 4:06:32 PM By: Baltazar Najjar MD Entered By: Baltazar Najjar on 04/01/2023 12:58:39 -------------------------------------------------------------------------------- Multi-Disciplinary Care Plan Details Patient Name: Date of Service: Karn, [name] 04/01/2023 3:00 PM Medical Record Number: [id number] Patient Account Number: [id number] Date of Birth/Sex: Treating RN: November 15, 1945 (77 y.o. [name] Primary Care Vaniyah Lansky: [name] Other Clinician: Referring Clista Rainford: Treating Marvella Jenning/Extender: Valentina Gu Weeks in Treatment: 47 [name], [name] ([id number])  [id number] Page 9 of 19 Multidisciplinary Care Plan reviewed with physician Active Inactive Wound/Skin Impairment Nursing Diagnoses: Impaired tissue integrity Knowledge deficit related to ulceration/compromised skin integrity Goals: Patient will have a decrease in wound volume by X% from date: (specify in notes) Date Initiated: 03/26/2022 Target Resolution Date: 04/17/2025 Goal Status: Active Patient/caregiver will verbalize understanding of skin care regimen Date Initiated: 03/26/2022 Date Inactivated: 03/25/2023 Target Resolution Date: 04/17/2025 Unmet Reason: patient missed last Goal Status: Unmet week. Patient Stop taking his diuertic. Ulcer/skin breakdown will have a volume reduction of 30% by week 4 Date Initiated: 03/26/2022 Date Inactivated: 05/21/2022 Target Resolution Date: 05/17/2022 Unmet Reason: see wound Goal Status: Unmet measurement. Ulcer/skin breakdown will have a volume reduction of 50% by week 8 Date Initiated: 03/26/2022 Date Inactivated: 05/21/2022 Target Resolution Date: 05/17/2022 Unmet Reason: see wound Goal Status: Unmet measurement. Interventions: Assess patient/caregiver ability to obtain necessary supplies Assess patient/caregiver ability to perform ulcer/skin care regimen upon admission and as needed Assess ulceration(s) every visit Notes: Patient stated today, "I will take my fluid pill or pump not do both." Laycee Fitzsimmons made aware. Electronic Signature(s) Signed: 04/01/2023 4:55:38 PM By: [name] RN, [name] Entered By: [name] on 04/01/2023 12:50:01 -------------------------------------------------------------------------------- Pain Assessment Details Patient Name: Date of Service: [name], [name] 04/01/2023 3:00 PM Medical Record Number: [id number] Patient Account Number: [id number] Date of Birth/Sex: Treating RN: 15-Dec-1945 (77 y.o. M) Primary Care Lavoris Sparling: [name] Other Clinician: Referring  Soua Caltagirone: Treating Abryana Lykens/Extender: Valentina Gu Weeks in Treatment: 80 Active Problems Location of Pain Severity and Description of Pain Patient Has Paino No Site Locations [place], [facility] ([id number]) [id number] Page 10 of 19 Pain Management and Medication Current Pain Management: Electronic Signature(s) Signed: 04/03/2023 10:02:03 AM By: Karl Ito Entered By: Karl Ito on 04/01/2023 12:21:08 -------------------------------------------------------------------------------- Patient/Caregiver Education Details Patient Name: Date of Service: Lindeman, [name] 11/13/2024andnbsp3:00 PM Medical Record Number: [id number] Patient Account Number: [id number] Date of Birth/Gender: Treating RN: [birth year] (77 y.o. [name] Primary Care Physician: [name] Other Clinician: Referring Physician: Treating Physician/Extender: Stark Klein in Treatment: 67 Education Assessment Education Provided To: Patient Education Topics Provided Wound/Skin Impairment: Handouts: Caring for Your Ulcer Methods: Explain/Verbal Responses: Reinforcements needed Electronic Signature(s) Signed: 04/01/2023 4:55:38 PM By: [name] RN, [name] Entered By: [name] on 04/01/2023 12:50:20 -------------------------------------------------------------------------------- Wound Assessment Details Patient Name: Date of Service: Anderle, [name] 04/01/2023 3:00 PM Medical Record Number: [id number] Patient Account Number: [id number] Date of Birth/Sex: Treating RN: August 02, 1945 (77 y.o. M) Primary Care Arrietty Dercole: [name] Other Clinician: Daughtry, [name] ([id number]) [id number] Page 11 of 19 Referring Johnston Maddocks: Treating Erubiel Manasco/Extender: Dominga Ferry, [name] Weeks in Treatment: [phone]  Wound Status Wound Number: 12 Primary Lymphedema Etiology: Wound Location: Left, Anterior  Lower Leg Wound Open Wounding Event: Frostbite Status: Date Acquired: 12/17/2022 Comorbid Anemia, Lymphedema, Deep Vein Thrombosis, Hypertension, Weeks Of Treatment: 15 History: Received Radiation Clustered Wound: No Photos Wound Measurements Length: (cm) 1.5 Width: (cm) 3.4 Depth: (cm) 0.1 Area: (cm) 4.006 Volume: (cm) 0.401 % Reduction in Area: -13.4% % Reduction in Volume: 43.3% Epithelialization: Small (1-33%) Tunneling: No Undermining: No Wound Description Classification: Full Thickness Without Exposed Support Structures Wound Margin: Distinct, outline attached Exudate Amount: Large Exudate Type: Serous Exudate Color: amber Foul Odor After Cleansing: No Slough/Fibrino Yes Wound Bed Granulation Amount: Medium (34-66%) Exposed Structure Granulation Quality: Red, Pink Fascia Exposed: No Necrotic Amount: Medium (34-66%) Fat Layer (Subcutaneous Tissue) Exposed: Yes Necrotic Quality: Adherent Slough Tendon Exposed: No Muscle Exposed: No Joint Exposed: No Bone Exposed: No Periwound Skin Texture Texture Color No Abnormalities Noted: No No Abnormalities Noted: No Callus: No Atrophie Blanche: No Crepitus: No Cyanosis: No Excoriation: No Ecchymosis: No Induration: No Erythema: No Rash: No Hemosiderin Staining: Yes Scarring: No Mottled: No Pallor: No Moisture Rubor: No No Abnormalities Noted: No Dry / Scaly: No Temperature / Pain Maceration: No Temperature: No Abnormality Treatment Notes Wound #12 (Lower Leg) Wound Laterality: Left, Anterior Cleanser Soap and Water Discharge Instruction: May shower and wash wound with dial antibacterial soap and water prior to dressing change. Peri-Wound Care Sween Lotion (Moisturizing lotion) Discharge Instruction: Apply moisturizing lotion as directed Capobianco, [name] ([id number]) [id number] Page 12 of 19 Topical Primary Dressing Maxorb Extra CMC/Alginate Dressing, 4x4 (in/in) Discharge  Instruction: Apply to wound bed as instructed Secondary Dressing ABD Pad, 8x10 Discharge Instruction: Apply over primary dressing as directed. Zetuvit Plus 4x8 in Discharge Instruction: Apply over primary dressing as directed. OptiLock Super Absorbent, 5x5.5 (in/in) Discharge Instruction: Apply directly to wound bed as directed Secured With Compression Wrap Urgo K2, (equivalent to a 4 layer) two layer compression system, regular Discharge Instruction: Apply Urgo K2 as directed (alternative to 4 layer compression). Compression Stockings Add-Ons Electronic Signature(s) Signed: 04/01/2023 4:55:38 PM By: [name] RN, [name] Entered By: [name] on 04/01/2023 12:48:26 -------------------------------------------------------------------------------- Wound Assessment Details Patient Name: Date of Service: Dechert, [name] 04/01/2023 3:00 PM Medical Record Number: [id number] Patient Account Number: [id number] Date of Birth/Sex: Treating RN: 03-14-1946 (77 y.o. M) Primary Care Vivaan Helseth: [name] Other Clinician: Referring Kaede Clendenen: Treating Lestine Rahe/Extender: Valentina Gu Weeks in Treatment: 63 Wound Status Wound Number: 15 Primary Lymphedema Etiology: Wound Location: Left, Distal, Posterior Lower Leg Wound Open Wounding Event: Gradually Appeared Status: Date Acquired: 01/14/2023 Comorbid Anemia, Lymphedema, Deep Vein Thrombosis, Hypertension, Weeks Of Treatment: 6 History: Received Radiation Clustered Wound: No Photos Wound Measurements Length: (cm) 6 Width: (cm) 5.5 Depth: (cm) 0.1 Area: (cm) 25.918 Volume: (cm) 2.592 Prieto, Trayton ([id number]) % Reduction in Area: -193.3% % Reduction in Volume: -193.2% Epithelialization: Small (1-33%) Tunneling: No Undermining: No [id number] Page 13 of 19 Wound Description Classification: Full Thickness Without Exposed Suppor Wound Margin: Distinct, outline attached Exudate  Amount: Medium Exudate Type: Serosanguineous Exudate Color: red, brown t Structures Foul Odor After Cleansing: No Slough/Fibrino Yes Wound Bed Granulation Amount: Small (1-33%) Exposed Structure Granulation Quality: Pink Fascia Exposed: No Necrotic Amount: Large (67-100%) Fat Layer (Subcutaneous Tissue) Exposed: Yes Necrotic Quality: Adherent Slough Tendon Exposed: No Muscle Exposed: No Joint Exposed: No Bone Exposed: No Periwound Skin Texture Texture Color No Abnormalities Noted: No No Abnormalities Noted: No Callus: No Atrophie Blanche: No Crepitus: No Cyanosis: No Excoriation: No  Ecchymosis: No Induration: No Erythema: No Rash: No Hemosiderin Staining: Yes Scarring: No Mottled: No Pallor: No Moisture Rubor: No No Abnormalities Noted: No Dry / Scaly: No Maceration: No Treatment Notes Wound #15 (Lower Leg) Wound Laterality: Left, Posterior, Distal Cleanser Soap and Water Discharge Instruction: May shower and wash wound with dial antibacterial soap and water prior to dressing change. Peri-Wound Care Sween Lotion (Moisturizing lotion) Discharge Instruction: Apply moisturizing lotion as directed Topical Primary Dressing Maxorb Extra CMC/Alginate Dressing, 4x4 (in/in) Discharge Instruction: Apply to wound bed as instructed Secondary Dressing ABD Pad, 8x10 Discharge Instruction: Apply over primary dressing as directed. Zetuvit Plus 4x8 in Discharge Instruction: Apply over primary dressing as directed. OptiLock Super Absorbent, 5x5.5 (in/in) Discharge Instruction: Apply directly to wound bed as directed Secured With Compression Wrap Urgo K2, (equivalent to a 4 layer) two layer compression system, regular Discharge Instruction: Apply Urgo K2 as directed (alternative to 4 layer compression). Compression Stockings Add-Ons Electronic Signature(s) Signed: 04/01/2023 4:55:38 PM By: [name] RN, [name] Entered By: [name] on 04/01/2023 12:48:40 Poet,  [name] ([id number]) [id number] Page 14 of 19 -------------------------------------------------------------------------------- Wound Assessment Details Patient Name: Date of Service: Lucero, [name] 04/01/2023 3:00 PM Medical Record Number: [id number] Patient Account Number: [id number] Date of Birth/Sex: Treating RN: 1946-03-18 (77 y.o. M) Primary Care Jamala Kohen: [name] Other Clinician: Referring Shirely Toren: Treating Jakayden Cancio/Extender: Valentina Gu Weeks in Treatment: 60 Wound Status Wound Number: 16 Primary Venous Leg Ulcer Etiology: Wound Location: Left, Lateral Lower Leg Wound Open Wounding Event: Blister Status: Date Acquired: 04/01/2023 Comorbid Anemia, Lymphedema, Deep Vein Thrombosis, Hypertension, Weeks Of Treatment: 0 History: Received Radiation Clustered Wound: No Photos Wound Measurements Length: (cm) 2.6 Width: (cm) 2.7 Depth: (cm) 0.1 Area: (cm) 5.513 Volume: (cm) 0.551 % Reduction in Area: % Reduction in Volume: Epithelialization: Small (1-33%) Tunneling: No Undermining: No Wound Description Classification: Full Thickness Without Exposed Suppor Wound Margin: Distinct, outline attached Exudate Amount: Medium Exudate Type: Serous Exudate Color: amber t Structures Foul Odor After Cleansing: No Slough/Fibrino No Wound Bed Granulation Amount: Large (67-100%) Exposed Structure Granulation Quality: Red Fascia Exposed: No Necrotic Amount: None Present (0%) Fat Layer (Subcutaneous Tissue) Exposed: No Tendon Exposed: No Muscle Exposed: No Joint Exposed: No Bone Exposed: No Periwound Skin Texture Texture Color No Abnormalities Noted: No No Abnormalities Noted: No Callus: No Atrophie Blanche: No Crepitus: No Cyanosis: No Excoriation: No Ecchymosis: No Induration: No Erythema: No Rash: No Hemosiderin Staining: No Scarring: No Mottled: No Pallor: No Moisture Rubor: No No Abnormalities Noted:  No Dry / Scaly: No Maceration: No Zeleznik, Adolf ([id number]) [id number].pdf Page 15 of 19 Treatment Notes Wound #16 (Lower Leg) Wound Laterality: Left, Lateral Cleanser Soap and Water Discharge Instruction: May shower and wash wound with dial antibacterial soap and water prior to dressing change. Peri-Wound Care Sween Lotion (Moisturizing lotion) Discharge Instruction: Apply moisturizing lotion as directed Topical Primary Dressing Maxorb Extra CMC/Alginate Dressing, 4x4 (in/in) Discharge Instruction: Apply to wound bed as instructed Secondary Dressing ABD Pad, 8x10 Discharge Instruction: Apply over primary dressing as directed. Zetuvit Plus 4x8 in Discharge Instruction: Apply over primary dressing as directed. OptiLock Super Absorbent, 5x5.5 (in/in) Discharge Instruction: Apply directly to wound bed as directed Secured With Compression Wrap Urgo K2, (equivalent to a 4 layer) two layer compression system, regular Discharge Instruction: Apply Urgo K2 as directed (alternative to 4 layer compression). Compression Stockings Add-Ons Electronic Signature(s) Signed: 04/01/2023 4:55:38 PM By: [name] RN, [name] Entered By: [name] on 04/01/2023 12:49:05 -------------------------------------------------------------------------------- Wound Assessment  Details Patient Name: Date of Service: [place]in, [name] 04/01/2023 3:00 PM Medical Record Number: [id number] Patient Account Number: [id number] Date of Birth/Sex: Treating RN: 13-Jul-1945 (77 y.o. M) Primary Care Retal Tonkinson: [name] Other Clinician: Referring Dewight Catino: Treating Yareth Macdonnell/Extender: Valentina Gu Weeks in Treatment: 69 Wound Status Wound Number: 6 Primary Lymphedema Etiology: Wound Location: Right, Medial Lower Leg Wound Open Wounding Event: Gradually Appeared Status: Date Acquired: 03/05/2022 Comorbid Anemia, Lymphedema, Deep Vein Thrombosis,  Hypertension, Weeks Of Treatment: 53 History: Received Radiation Clustered Wound: Yes Photos Ferrentino, [name] ([id number]) [id number] Page 16 of 19 Wound Measurements Length: (cm) 15 Width: (cm) 16 Depth: (cm) 0.4 Clustered Quantity: 1 Area: (cm) 188.496 Volume: (cm) 75.398 % Reduction in Area: -31.9% % Reduction in Volume: -427.5% Epithelialization: Small (1-33%) Tunneling: No Undermining: No Wound Description Classification: Full Thickness Without Exposed Sup Wound Margin: Distinct, outline attached Exudate Amount: Large Exudate Type: Serous Exudate Color: amber port Structures Foul Odor After Cleansing: No Slough/Fibrino Yes Wound Bed Granulation Amount: Small (1-33%) Exposed Structure Granulation Quality: Pink Fascia Exposed: No Necrotic Amount: Large (67-100%) Fat Layer (Subcutaneous Tissue) Exposed: Yes Necrotic Quality: Adherent Slough Tendon Exposed: No Muscle Exposed: No Joint Exposed: No Bone Exposed: No Periwound Skin Texture Texture Color No Abnormalities Noted: No No Abnormalities Noted: No Callus: No Atrophie Blanche: No Crepitus: No Cyanosis: No Excoriation: No Ecchymosis: No Induration: No Erythema: No Rash: No Hemosiderin Staining: Yes Scarring: Yes Mottled: No Pallor: No Moisture Rubor: No No Abnormalities Noted: No Dry / Scaly: No Temperature / Pain Maceration: No Temperature: No Abnormality Treatment Notes Wound #6 (Lower Leg) Wound Laterality: Right, Medial Cleanser Soap and Water Discharge Instruction: May shower and wash wound with dial antibacterial soap and water prior to dressing change. Peri-Wound Care Sween Lotion (Moisturizing lotion) Discharge Instruction: Apply moisturizing lotion as directed Topical Primary Dressing Maxorb Extra CMC/Alginate Dressing, 4x4 (in/in) Discharge Instruction: Apply to wound bed as instructed Secondary Dressing ABD Pad, 8x10 Discharge Instruction: Apply over  primary dressing as directed. Zetuvit Plus 4x8 in Discharge Instruction: Apply over primary dressing as directed. Welty, [name] ([id number]) [id number] Page 17 of 19 OptiLock Super Absorbent, 5x5.5 (in/in) Discharge Instruction: Apply directly to wound bed as directed Secured With Compression Wrap Urgo K2, (equivalent to a 4 layer) two layer compression system, regular Discharge Instruction: Apply Urgo K2 as directed (alternative to 4 layer compression). Compression Stockings Add-Ons Electronic Signature(s) Signed: 04/01/2023 4:55:38 PM By: [name] RN, [name] Entered By: [name] on 04/01/2023 12:49:25 -------------------------------------------------------------------------------- Wound Assessment Details Patient Name: Date of Service: Tibbits, [name] 04/01/2023 3:00 PM Medical Record Number: [id number] Patient Account Number: [id number] Date of Birth/Sex: Treating RN: [birth year] (77 y.o. M) Primary Care Braydan Marriott: [name] Other Clinician: Referring Cobi Aldape: Treating Illya Gienger/Extender: Valentina Gu Weeks in Treatment: 67 Wound Status Wound Number: 7 Primary Lymphedema Etiology: Wound Location: Left, Medial Lower Leg Wound Open Wounding Event: Gradually Appeared Status: Date Acquired: 03/05/2022 Comorbid Anemia, Lymphedema, Deep Vein Thrombosis, Hypertension, Weeks Of Treatment: 53 History: Received Radiation Clustered Wound: Yes Photos Wound Measurements Length: (cm) Width: (cm) Depth: (cm) Clustered Quantity: Area: (cm) Volume: (cm) 6 % Reduction in Area: 25% 7.5 % Reduction in Volume: 25% 0.1 Epithelialization: Small (1-33%) 1 Tunneling: No 35.343 Undermining: No 3.534 Wound Description Classification: Full Thickness Without Exposed Sup Wound Margin: Distinct, outline attached Exudate Amount: Large Exudate Type: Serous Exudate Color: amber port Structures Foul Odor After Cleansing:  No Slough/Fibrino Yes Wound Bed Granulation Amount: Small (1-33%) Exposed Structure Granulation  Quality: Red Fascia Exposed: No Nevares, Cadell ([id number]) [id number] Page 18 of 19 Necrotic Amount: Large (67-100%) Fat Layer (Subcutaneous Tissue) Exposed: Yes Necrotic Quality: Eschar Tendon Exposed: No Muscle Exposed: No Joint Exposed: No Bone Exposed: No Periwound Skin Texture Texture Color No Abnormalities Noted: No No Abnormalities Noted: No Callus: No Atrophie Blanche: No Crepitus: No Cyanosis: No Excoriation: No Ecchymosis: No Induration: No Erythema: No Rash: No Hemosiderin Staining: Yes Scarring: Yes Mottled: No Pallor: No Moisture Rubor: No No Abnormalities Noted: No Dry / Scaly: No Temperature / Pain Maceration: No Temperature: No Abnormality Treatment Notes Wound #7 (Lower Leg) Wound Laterality: Left, Medial Cleanser Soap and Water Discharge Instruction: May shower and wash wound with dial antibacterial soap and water prior to dressing change. Peri-Wound Care Sween Lotion (Moisturizing lotion) Discharge Instruction: Apply moisturizing lotion as directed Topical Primary Dressing Maxorb Extra CMC/Alginate Dressing, 4x4 (in/in) Discharge Instruction: Apply to wound bed as instructed Secondary Dressing ABD Pad, 8x10 Discharge Instruction: Apply over primary dressing as directed. Zetuvit Plus 4x8 in Discharge Instruction: Apply over primary dressing as directed. OptiLock Super Absorbent, 5x5.5 (in/in) Discharge Instruction: Apply directly to wound bed as directed Secured With Compression Wrap Urgo K2, (equivalent to a 4 layer) two layer compression system, regular Discharge Instruction: Apply Urgo K2 as directed (alternative to 4 layer compression). Compression Stockings Add-Ons Electronic Signature(s) Signed: 04/01/2023 4:55:38 PM By: [name] RN, [name] Entered By: [name] on 04/01/2023  12:49:42 -------------------------------------------------------------------------------- Vitals Details Patient Name: Date of Service: [place], [name] 04/01/2023 3:00 PM Medical Record Number: [id number] Patient Account Number: [id number] Date of Birth/Sex: Treating RN: 12-25-1945 (77 y.o. M) Primary Care Elke Holtry: [name] Other Clinician: Referring Erynne Kealey: Treating Vonita Calloway/Extender: Valentina Gu Gidley, [name] ([id number]) [id number] Page 19 of 19 Weeks in Treatment: 53 Vital Signs Time Taken: 15:45 Temperature (F): 98.8 Height (in): 74 Pulse (bpm): 91 Weight (lbs): 250 Respiratory Rate (breaths/min): 18 Body Mass Index (BMI): 32.1 Blood Pressure (mmHg): 177/67 Reference Range: 80 - 120 mg / dl Electronic Signature(s) Signed: 04/03/2023 10:02:03 AM By: Karl Ito Entered By: Karl Ito on 04/01/2023 12:45:46

## 2023-04-08 ENCOUNTER — Encounter (HOSPITAL_BASED_OUTPATIENT_CLINIC_OR_DEPARTMENT_OTHER): Payer: Medicare Other | Admitting: Internal Medicine

## 2023-04-08 DIAGNOSIS — I87333 Chronic venous hypertension (idiopathic) with ulcer and inflammation of bilateral lower extremity: Secondary | ICD-10-CM | POA: Diagnosis not present

## 2023-04-09 NOTE — Progress Notes (Signed)
Culp, [name] [name] ([id number]) [id number] Page 1 of 16 Visit Report for 04/08/2023 Debridement Details Patient Name: Date of Service: [name], [name] [name] 04/08/2023 2:45 PM Medical Record Number: [id number] Patient Account Number: [id number] Date of Birth/Sex: Treating RN: 1946-03-17 (77 y.o. M) Primary Care Provider: Ricki [name] Other Clinician: Referring Provider: Treating Provider/Extender: [name] [name] Weeks in Treatment: 54 Debridement Performed for Assessment: Wound #12 Left,Anterior Lower Leg Performed By: Physician [name] Caul., MD The following information was scribed by: [name] [name] The information was scribed for: [name] [name] Debridement Type: Debridement Level of Consciousness (Pre-procedure): Awake and Alert Pre-procedure Verification/Time Out Yes - 16:20 Taken: Start Time: 16:20 Pain Control: Lidocaine 4% T opical Solution Percent of Wound Bed Debrided: 80% T Area Debrided (cm): otal 4.4 Tissue and other material debrided: Non-Viable, Slough, Subcutaneous, Slough Level: Skin/Subcutaneous Tissue Debridement Description: Excisional Instrument: Curette Bleeding: Minimum Hemostasis Achieved: Pressure Response to Treatment: Procedure was tolerated well Level of Consciousness (Post- Awake and Alert procedure): Post Debridement Measurements of Total Wound Length: (cm) 3.5 Width: (cm) 2 Depth: (cm) 0.1 Volume: (cm) 0.55 Character of Wound/Ulcer Post Debridement: Improved Post Procedure Diagnosis Same as Pre-procedure Electronic Signature(s) Signed: 04/08/2023 4:37:54 PM By: [name] Najjar MD Entered By: [name] [name] on 04/08/2023 16:28:32 -------------------------------------------------------------------------------- Debridement Details Patient Name: Date of Service: [name], [name] [name] 04/08/2023 2:45 PM Medical Record Number: [id number] Patient Account Number: [id number] Date of Birth/Sex: Treating  RN: 23-Sep-1945 (77 y.o. M) Primary Care Provider: Ricki [name] Other Clinician: Referring Provider: Treating Provider/Extender: [name] [name] Weeks in Treatment: 42 Debridement Performed for Assessment: Wound #6 Right,Medial Lower Leg Performed By: Physician [name] Caul., MD The following information was scribed by: [name] [name] The information was scribed for: [name] [name] [name] [name] ([id number]) [id number] Page 2 of 16 Debridement Type: Debridement Level of Consciousness (Pre-procedure): Awake and Alert Pre-procedure Verification/Time Out Yes - 16:20 Taken: Start Time: 16:20 Pain Control: Lidocaine 4% T opical Solution Percent of Wound Bed Debrided: 50% T Area Debrided (cm): otal 28.26 Tissue and other material debrided: Non-Viable, Slough, Subcutaneous, Slough Level: Skin/Subcutaneous Tissue Debridement Description: Excisional Instrument: Curette Bleeding: Minimum Hemostasis Achieved: Pressure Response to Treatment: Procedure was tolerated well Level of Consciousness (Post- Awake and Alert procedure): Post Debridement Measurements of Total Wound Length: (cm) 8 Width: (cm) 9 Depth: (cm) 0.3 Volume: (cm) 16.965 Character of Wound/Ulcer Post Debridement: Improved Post Procedure Diagnosis Same as Pre-procedure Electronic Signature(s) Signed: 04/08/2023 4:37:54 PM By: [name] Najjar MD Entered By: [name] [name] on 04/08/2023 16:28:47 -------------------------------------------------------------------------------- HPI Details Patient Name: Date of Service: [name], [name] [name] 04/08/2023 2:45 PM Medical Record Number: [id number] Patient Account Number: [id number] Date of Birth/Sex: Treating RN: December 27, 1945 (77 y.o. M) Primary Care Provider: Ricki [name] Other Clinician: Referring Provider: Treating Provider/Extender: [name] [name] Weeks in Treatment: 6 History of Present Illness HPI  Description: ADMISSION 02/23/2020 Patient is a 77 year old man who lives in [place] who arrives accompanied by his wife. He has a history of chronic lymphedema and venous insufficiency in his bilateral lower legs which may have something to do that with having a history of DVT as well as being treated for prostate cancer. In any case he recently got compression pumps at home but compliance has been an issue here. He has compression stockings however they are probably not sufficient enough to control swelling. They tell us that things deteriorated for him in late August he was admitted to [name] for 7 days. This was with cellulitis I think  of his bilateral lower legs. Discharge he was noted to have wounds on his bilateral lower legs. He was discharged on Bactrim. They tried to get him home health through [name] of course they declined him. His wife is been wrapping these applying some form of silver foam dressing. He has a history of wounds before although nothing that would not heal with basic home topical dressings. He has 2 areas on the left medial, left anterior and left lateral and a smaller area on the right medial. All of these have considerable depth. Past medical history includes iron deficiency anemia, lymphedema followed by the rehab [name] at [name] with lymphedema wraps I believe, DVT on chronic anticoagulation, prostate cancer, chronic venous insufficiency, hypertension. As mentioned he has compression pumps but does not use them. ABIs in our clinic were noncompressible bilaterally 10/14; patient with severe bilateral lymphedema right greater than left. He came in with bilateral lower extremity wounds left greater than right. Even though the right side has more of the edema most of the wounds here almost closed on the right medial. He has 3 remaining wounds on the left We have been using silver alginate under 4-layer compression I have been trying to  get him to be compliant with his external compression pumps 10/21; patient with 3 small wounds on the left leg and 1 on the right medial in the setting of severe lymphedema and chronic venous insufficiency. We have been using silver alginate under 4-layer compression he is using his external compression pumps twice a day 11/4; ARTERIAL STUDIES on the right show an ABI of 1.02 TBI of 0.858 with biphasic waveforms on the left 0.98 with a TBI of 0.55 and biphasic waveforms. Does [name], [name] [name] ([id number]) [id number] Page 3 of 16 not look like he has significant arterial disease. We are treating him for lymphedema he has compression pumps. He has punched-out areas on the left anterior left lateral and right medial lower extremities 11/11; after we obtained his arterial studies I put him in 4 layer compression. He is using his compression pumps probably once a day although I have asked him to do twice. Primary dressing to the wound is silver collagen he has severe lymphedema likely secondary to chronic venous insufficiency. Wounds on the left lateral, left medial and left anterior and a small area on the right medial 12/2; the area on the right anterior lower leg has healed. We initially thought that the area medially had healed as well however when her discharge nurse came in she detected fluid in the wound simply opened up. This is actually worse than I remember this pain. The area on the left lateral potentially slightly smaller He is also complaining about pain in his left hand he says that this is actually been getting some better he has been using topical creams on this. She asked that I look at this 12/9 after last weeks issues we have 2 wounds one on the right medial lower leg and 1 on the left lateral. Both of these are in the same condition. I think because of thickened skin secondary to chronic lymphedema these wounds actually have depth of almost 0.8 cm. 12/16;  the patient has 2 small but deep wounds one on the right medial and one on the left lateral. The right medial is actually the worst of these. He arrives in clinic today with absolutely terrible edema in the right leg apparently his 4-layer wrap fell down to just  above his ankle he did not think about this he is apparently been continuing to use his compression pump twice a day. The left leg looks a lot better. 05/09/2020 upon evaluation today patient appears to be doing decently well in regard to his wounds. Everything is measuring smaller the right leg still has a little bit deeper wound in the left seems to be almost completely healed in my opinion I am very pleased in general with how things are progressing. He has a 4- layer compression wrap we have been using endoform today we will probably have to use collagen just based on the fact that we do not have endoform it is on order. 1/6; the patient's wound on the left lateral lower leg has healed. Still has 1 on the right medial. 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 Using compression pumps at home twice a day. 1/13; left lateral lower leg is still healed. He has a deep punched out rectangular shaped wound on the right medial calf. Looking down at this it appears that he is attempting to epithelialize around the edges of the wound and on the base as well. His edema is reasonably well controlled we have been using collagen with absolutely no effect 1/20; left lateral lower leg remains closed he has extremitease stockings. The area on the right medial calf I aggressively debrided last week measures larger but the surface looks better. We have been using Hydrofera Blue. We ran Oasis through his insurance but we have not seen the results of this 1/27; left lower leg wound with chronic venous insufficiency and secondary lymphedema. I did aggressive debridement on this last week the wound seems to have come in healthy looking surface  using Hydrofera Blue. He was denied for Oasis 2/3; small divot in the right medial lower leg. Under illumination the walls of this divot are epithelialized however the base has slough which I removed with a curette we have been using Hydrofera Blue 2/10 small divot on the right medial lower leg pinpoint illumination at the base of this cone-shaped wound. We have been using Hydrofera Blue but I will switch to calcium alginate this week 2/17; the small divot on the right medial lower leg is fully epithelialized. There is no visible open area under illumination. He has his own stocking for the right leg similar to the one he has been wearing on the left. 03/26/2022; READMISSION This is a now 77 year old man that we had in the clinic from 02/23/2020 through 07/05/2020. At that point he had bilateral lower extremity wounds left greater than right in the setting of severe lymphedema. He had already obtained compression pumps ordered for him I think from the wound care clinic in [facility] so I do not really have record of what he has been using. He claims to be using them once a day but there is a problem with the sleeve on the left leg. About 2 weeks ago he was hospitalized from 03/11/2022 through 03/14/2022 with diastolic congestive heart failure. 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 He was diuresed. Developed some prerenal azotemia and he has not been taking any diuretics currently. He has not been putting stockings on his legs since he got out of hospital and still has his legs dependent for long periods. Past medical history history of prostate cancer treated with prostatectomy and radiation this was apparently about 8 years ago, history of DVT on chronic Coumadin,  history of lymphedema was managed for a while at the clinic in [place]. History of inguinal hernia repair in September 22, hypertension, stage IIIb chronic renal failure ABIs  today were noncompressible on the right 1.12 on the left 04-02-2022 upon evaluation today patient appears to be doing well currently in regard to his legs I do feel like both areas that are draining are actually much drier than they were in the picture last week although the left is drier than the right. He is tolerating the 4-layer compression wraps at this point he did contact the pump company and they are actually working on getting him a new compression sleeve for one of his legs which have previously popped and was not functioning properly. 04-23-2022 upon evaluation today patient appears to be doing well currently in regard to his wounds on the legs. I am actually very pleased with where things stand and I do feel like that we are headed in the right direction. Fortunately there is no sign of active infection locally or systemically at this time. 05-07-2022 upon evaluation today patient appears to be doing well currently in regard to his wounds in fact things are showing signs of improvement which is good news I do not see too much that actually appears to be open and I am very pleased in that regard. No fevers, chills, nausea, vomiting, or diarrhea. 05-21-2022 upon evaluation today patient appears to be doing somewhat poorly in regard to drainage of his lower extremities bilaterally. The right is greater than left as far as the weeping area. Nonetheless it seems to be getting worse not better. He actually has pitting edema which is at least 2+ to the thighs and I am concerned about the fact that he is may be fluid overloaded in general and that is the reason why we cannot get this under control. I know he is not using his pumps all the time because he actually told the nurse that he was either going to pump or he was going to use his fluid pills but not do both. For that reason I do think that he needs to be really doing both in order to get the fluid out as effectively as possible obviously with  the 4-layer compression wraps were doing as much as we can from a compression standpoint but it is really not enough. He tells me that he elevates his leg is much as he can in between pumping and other activity throughout the day. 05-28-2022 upon evaluation today patient appears to be doing better in regard to his wounds although the measurements may be a little bit larger this is a very difficult wound to heal it is very indistinct in a lot of areas. Nonetheless there is can be some need for sharp debridement in regard to both medial and lateral legs. Fortunately I see no signs of active infection locally nor systemically at this time. No fevers, chills, nausea, vomiting, or diarrhea. 06-04-2022 upon evaluation today patient appears to be doing poorly in general in regard to the wounds on his legs. He still continues to have a tremendous amount of fluid not just in the lower portion of his leg but to be honest his thigh where he has 2-3+ pitting edema in the thigh as well. Unfortunately I do not know that we will be able to get this healed effectively and keep it healed on the lower extremities unless he gets the overall fluid situation taking and under control. Fortunately I do not  see any signs of infection locally nor systemically which is great news. He just seems to be very fluid overloaded. 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 He has been tolerating the dressing changes with the compression wraps without complication. Fortunately I do not see any evidence of infection at this time which is great news. No fevers, chills, nausea, vomiting, or diarrhea. 06-18-2022 upon evaluation today patient appears to be doing well currently in regard to his wounds as far as not looking like they are terribly infected but nonetheless I am concerned about a subacute infection secondary to the fact  that he continues to have spreading despite the compression therapy. We actually did do an Unna boot on him last week this is actually the first wrap that actually stayed up everything else has been sliding down quite significantly. Fortunately there does not appear to be any signs of infection systemically at this time. With that being said I do believe that locally there seems to be an Kendle, [name] [name] ([id number]) [phone].pdf Page 4 of 16 issue going on here and again I Ernie Hew do a PCR culture to see what that shows also think that I am going to put him on a broad-spectrum antibiotic, doxycycline to see how that will help as well. He does tell me that coming into the clinic today that he was feeling short of breath like "he was about to have a heart attack" because he was having such a hard time breathing. He says that he told this to Dr. Jodelle Green his cardiologist as well when he was evaluated in the past 1 to 2 weeks. 07-02-2022 upon evaluation today patient appears to be doing poorly currently in regard to his wound. He has been tolerating the dressing changes. Unfortunately he has not had any compression wraps on for the past week because he was unable to make it in for his appointment last week. With that being said he has a significant amount of drainage he tells me has been using his pumps but despite this in the pumps he still has been draining quite a bit. The drainage is also somewhat purulent unfortunately. We did attempt to get in touch with his cardiologist last week unfortunately we were unable to get up with him I did advise that the patient needs to get in touch with him upon leaving today in order to make sure they know he is on the new antibiotics I am going to send him this will be Levaquin and Augmentin. 07-09-2022 upon evaluation today patient appears to be doing about the same in regard to his legs he may have just a slight amount of improvement with  regard to the drainage probably Keystone topical antibiotics are helping in this regard to some degree. Fortunately there does not appear to be any signs of active infection systemically which is great news. No fevers, chills, nausea, vomiting, or diarrhea. 07-16-2022 upon evaluation today patient appears to be doing well currently in regard to his wound. He has been tolerating the dressing changes without complication. Fortunately there does not appear to be any signs of active infection locally nor systemically at this time. With that being said he cannot keep the wraps up he tells me on the left side he had to cut this down because it got too tight. He has been using his pumps but he is on the right side the wrap actually straight down causing some pushing  around the central part of his leg just below the calf I think this is a bigger risk for him that help at this point. I think that we may need to try something different he should be getting his compression socks shortly he tells me they were ordered last Thursday. 3/6; ; this is a patient who lives in [place]. He has severe bilateral lymphedema. He has compression pumps, we have been using kerlix Ace wrap Keystone. He is changing the dressing. We do not have home health. 08-06-2022 upon evaluation today patient appears to be doing a little better in regard to his wounds in general at this point. Fortunately there does not appear to be any signs of active infection locally nor systemically at this time which is great news and overall I am extremely pleased with where we stand today. 08-13-2022 upon evaluation today patient appears to actually be doing significantly better compared to last week. He actually did go to the hospital I told him that he needed to when he left here and he actually did go. With that being said they actually ended up admitting him he was having shortness of breath and I thought it might be related to congestive heart failure  turns out he actually had a pulmonary embolism. Subsequently they were able to get him off of the Coumadin switching over to Eliquis to get things stabilized in that regard they also had them wrapped and got his swelling under control on his legs he actually looks much better pretty much across the board at this point. I am very pleased in that regard. With that being said I am very happy that he finally went that could have been a very dangerous situation. 08-20-2022 upon evaluation today patient appears to be doing well currently in regard to his wound. Has been tolerating the dressing changes without complication. Fortunately there does not appear to be any signs of active infection locally nor systemically at this time. I think his legs are doing better there is some need for sharp debridement today. 08-27-2022 upon evaluation patient is actually making excellent progress. I am actually very pleased with where he stands and I think that he is moving in the right direction. In general I think that we are looking pretty good at the moment. 09-03-2022 upon evaluation today patient appears to be doing well currently in regard to his wound. He is actually tolerating dressing changes on the left and right leg without complication. Fortunately I do not see any need for debridement of the left leg the right leg I think we probably do some need to perform some debridement here. 09-10-2022 upon evaluation today patient's wounds actually showed signs of improvement in both legs I do not see much is going require debridement today which was great news. Fortunately I do not see any evidence of infection which I think is also excellent he seems to be using his pumps and doing everything right I am happy about how this is progressing at this point. 09-17-2022 upon evaluation today patient appears to be doing decently well in regard to his wounds. He has been tolerating the dressing changes without complication.  Fortunately there does not appear to be any signs of active infection at this time which is good news. 09-24-2022 upon evaluation today patient appears to be doing well currently in regard to his wounds. He has been tolerating the dressing changes without complication. Fortunately there does not appear to be any signs of active infection locally  nor systemically which is great news. No fevers, chills, nausea, vomiting, or diarrhea. 10-08-2022 upon evaluation today patient appears to be doing excellent currently in regard to his wound. He has been tolerating the dressing changes without complication though it does not sound like he has been using his compression wraps for a bit here. He does think he was doing better with the [name] topical antibiotics we can definitely go back to using that but I think the biggest issue here is that his swelling is just very out of control and needs to be under control. I discussed that with him today. 10-15-2022 upon evaluation today patient appears to be doing well currently in regard to his wounds. He is actually making some progress which is good news. Fortunately I do not see any evidence of active infection locally nor systemically which is great news as well. No fevers, chills, nausea, vomiting, or diarrhea. He does have a callused area on the plantar aspect of his left foot which is actually causing some pain and he wonders if I can trim this down for him. 10-22-2022 upon evaluation today patient seems to be making progress. He is actually doing quite well and very pleased in that regard. I do not see any signs of active infection at this time. 11-05-2022 upon evaluation patient appears to be making progress although slowly towards closure. He seems to be doing well with regard to his legs were still using the [name] topical antibiotics and he seems to be doing quite well. He is going require some sharp debridement today. 11-19-2022 upon evaluation today patient  unfortunately appears to be extremely swollen at this point. He tells me that he ran out of supplies he also tells me his leg started leaking more because of not having supplies he was unable to wear his compression wraps that is the juxta fit compression wraps. Therefore his legs are extremely swollen much larger than normal and do not appear to be doing well at all today. He is going require some debridement I also think he is going require [place] to perform compression wrapping today. 11-26-2022 upon evaluation today patient appears to be doing well currently in regard to his legs as far as infection is concerned I see nothing that appears to be infected. Fortunately I do not see any signs of active infection systemically either which is also good news. With that being said he still is extremely swollen as far as his legs are concerned. I do not see any signs of overall worsening but also do not see any signs of significant improvement which is the major issue here. 12-03-2022 upon evaluation today patient appears to be doing poorly still in regard to his legs the apparently has been taken off the wraps and not leaving the week by week. With that being said he has not had really the dressings to put on the release that is running out and subsequently though he is using his wraps I am not sure that he has been keeping them on like he needs to. I explained to him I really wanted to have the wraps that I put on him left on until he comes back to see me so that we can keep the compression under better control. He voiced understanding today he tells me that he was "confused about that". Nonetheless based on what I am seeing right now also think that he is up on his feet too much I think he needs to get the feet elevated.  7/24; this is a patient with severe bilateral stage III lymphedema. On the right lower leg medially is a large area of macerated denuded skin was too small deeper areas in this. On the left  leg he is 2 areas that are a little more standard in terms of wounds. Massive lymphedema bilaterally. He has compression pumps at home which he uses for 1 hour twice a day [name], [name] ([id number]) [phone].pdf Page 5 of 16 12-17-2022 upon evaluation today patient's legs though a little bit smaller still continue to have significant issues with weeping here this just does not dry. I really think he needs to be changing this daily and this means we will probably have to try to go back to the Velcro wraps and give this a shot. I think may be going to the Velcro wraps, changing the dressing to a superabsorber like XtraSorb or the like, and have him elevate his legs and do his pumps 3 times per day is probably going to be the way to go to try to see if we can make some improvement here. 12-24-2022 upon evaluation today patient unfortunately is still struggling to get anything under control. We have been extremely aggressive and trying to: Control his swelling everything we do however seems to be met with resistance. He tells me that he is pumping 3 times per day with his lymphedema pumps. He also tells me that he is try to elevate his legs is much as possible and that subsequently he has been keeping his compression wraps on. He goes back and forth between saying that he can keep our wraps on and not but right now I really do not see that he is doing well with the juxta fit is at least not from the standpoint of improving the overall appearance I think it does to some degree help maintain but we are still struggling here. I have voiced a concern to the patient that despite everything you are doing I am still not seeing a lot of improvement here and to be honest I am not sure what is can take get this under control he may have to go into the hospital for admission. 12-31-2022 upon evaluation today patient appears to be doing well currently in regard to his wounds. They seem a little  better although he has several pustules around the legs in general but has been concerned about infection. I am actually going to go ahead and see about getting an antibiotic sent into the pharmacy today although I am going to obtain a PCR culture as well. 01-07-2023 upon evaluation today patient appears to be doing well currently in regard to his wound. Has been tolerating the dressing changes without complication. He actually seems to be doing much better. He did have a PCR culture which showed Serratia as well and the prominent organisms here. Levaquin was actually a good option to treat the Serratia along with the other organisms that were problematic including E. coli. Nonetheless I think coupled with the topical Keystone antibiotics and the alginate he is really doing quite well today. 8/28; patient with severe bilateral lymphedema he has wounds bilaterally on his lower legs but they look a lot better today. We are using silver alginate and Keystone with Urgo K2 lite wraps. He comes in today with his wounds looking quite good. He was prescribed Levaquin last time I think he will complete this in a few days. 01-21-2023 upon evaluation today patient appears to be doing worse in regard  to his swelling took his wrap off he tells me he believes on Saturday. With that being said his leg is significantly swollen since that time and unfortunately does not appear to be doing nearly as good as it was last time I saw him 2 weeks ago or even last week for that matter. I am very concerned about this. 01-28-2023 upon evaluation today patient appears to be doing poorly currently in regard to his legs. He has been doing everything he says that we have recommended, he continues to use his lymphedema pumps 3 times a day, he has been doubling up on his fluid pills, he has been elevating his legs, and he tells me that he also has kept the wraps on and in fact he did have them on the day when he came in. Again that for  feels the compression side of things. Nonetheless with everything going on here we still have not been able to get this completely under control. He continues to have issues with significant swelling of lower extremities and this is not limited to just his lower left portion of his legs but his thighs are fairly large as well. With that being said I really feel like that we have reached the extent of what we have to offer here at the wound care [name] I am wondering if we can see about making a referral for second opinion to [facility] to see if there is anything they could do to help him out at this point. It is really not much further from his home to there as it is from his home to here. Both are about an hour distance. He lives in [place]. 02-04-2023 upon evaluation today patient appears to be doing much better actually compared to last week's evaluation. This is actually significant improvement and overall I do not know exactly what he did different other than he tells me that he "has been taking his fluid pills every day instead of every other day. Obviously this is needed. I was under the understanding previously that he was taking this every day but I think he may also not been pumping every day like he was telling me but I cannot be sure this either way he does seem to be doing what he supposed to do at this point. 02-11-2023 upon evaluation today patient unfortunately appears to be about as bad today as he appeared good last week. I am not sure what happened other than the fact he did take his wrap off the right leg he tells me it got so tight that he could not feel his toes they are getting very numb. Subsequently he tells me that it was difficulty with driving therefore he took it off. With that being said I do believe that he may need to go to the ER for evaluation and treatment I feel he may need to be admitted to get some of the fluid off of his legs. I think that he is volume  overloaded and at this point I do not think there is much working to be able to do to manage this outpatient we have tried pretty much everything that I am going to do. 02-18-23 upon evaluation today patient appears to be doing pretty well currently compared to last week this is actually better today. He is less weeping and the legs are actually significantly smaller. He tells me has been trying to stay in bed with his legs elevated more. 02-25-2023 upon evaluation today  patient appears to be doing well currently in regard to his legs in general compared to where we have been. Fortunately I do not see any signs of active infection locally or systemically which is great news and in general I do believe that we are making good headway here towards complete closure. I am hopeful that he will continue to show signs of improvement as we progress going forward. He does seem to have been taking his fluid pills as well as elevating his legs and using lymphedema pumps on a regular basis. He is trying to stay out of the hospital. 03-04-2023 upon evaluation today patient appears to be doing well currently in regard to his wound. He has been tolerating the dressing changes without complication. Fortunately there does not appear to be any signs of active infection at this time. 03-11-2023 upon evaluation today patient appears to be doing well currently in regard to his wounds. He has been tolerating the dressing changes without complication. Fortunately I do not see any signs of active infection at this time which is great news. 11/6; patient missed his clinic appointment last week. He took his wraps off at some point. Did not take his diuretics because he was nauseated last week. He comes in with massive bilateral edema which is lymphedema and probably some degree of systemic fluid overload. 11/13; patient with very poorly controlled lymphedema. We have been putting 4-layer equivalent wraps on him he says he is  using his compression pumps twice a day. And elevating his legs nevertheless the edema control today is very poor. He has a substantial wound on the right medial lower leg also of the left medial and left posterior. We have been using alginate as a primary dressing or Aquacel Ag 11/20; some improvement in the amount of edema. We have been using 4-layer equivalent wraps and he is pumping with his external compression pumps twice a day. His legs had improved in terms of edema although there is still massively edematous. He tells me that he is on torsemide 1-1/2 tablets 20 mg tabs a day Electronic Signature(s) Signed: 04/08/2023 4:37:54 PM By: [name] Najjar MD Entered By: [name] [name] on 04/08/2023 16:30:18 Physical Exam Details -------------------------------------------------------------------------------- Pileggi, [name] [name] ([id number]) [id number] Page 6 of 16 Patient Name: Date of Service: Vierra, [name] [name] 04/08/2023 2:45 PM Medical Record Number: [id number] Patient Account Number: [id number] Date of Birth/Sex: Treating RN: 1945-08-01 (77 y.o. M) Primary Care Provider: Ricki [name] Other Clinician: Referring Provider: Treating Provider/Extender: [name] [name] Weeks in Treatment: 44 Constitutional Patient is hypertensive.. Pulse regular and within target range for patient.Marland Kitchen Respirations regular, non-labored and within target range.. Temperature is normal and within the target range for the patient.Marland Kitchen Appears in no distress. Cardiovascular . Notes Wound exam; substantial area on the right medial lower leg, left medial and left posterior. Severe bilateral lymphedema although I think this is somewhat better than last week. No evidence of cellulitis nothing looks acute here. Severe stage III lymphedema Electronic Signature(s) Signed: 04/08/2023 4:37:54 PM By: [name] Najjar MD Entered By: [name] [name] on 04/08/2023  16:31:36 -------------------------------------------------------------------------------- Physician Orders Details Patient Name: Date of Service: [name], [name] [name] 04/08/2023 2:45 PM Medical Record Number: [id number] Patient Account Number: [id number] Date of Birth/Sex: Treating RN: 1945-08-29 (77 y.o. M) Primary Care Provider: Ricki [name] Other Clinician: Referring Provider: Treating Provider/Extender: [name] [name] Weeks in Treatment: 62 Verbal / Phone Orders: No Diagnosis Coding Follow-up Appointments ppointment in 2 weeks. - Dr Leanord Hawking Return  A Return appointment in 3 weeks. - Wednesday Dr. Leanord Hawking (front office to schedule) Nurse Visit: - 04/15/2023 ******60 minutes***** Other: - take your fluid pills as prescribed. Speak with cardiologist about increasing fluid pills Anesthetic (In clinic) Topical Lidocaine 5% applied to wound bed Bathing/ Shower/ Hygiene May shower with protection but do not get wound dressing(s) wet. Protect dressing(s) with water repellant cover (for example, large plastic bag) or a cast cover and may then take shower. Edema Control - Orders / Instructions Segmental Compressive Device. Use the Segmental Compressive Device on leg(s) 2-3 times a day for 45 - 60 minutes. If wearing any wraps or hose, do not remove them. Continue exercising as instructed. - x3 a day Elevate legs to the level of the heart or above for 30 minutes daily and/or when sitting for 3-4 times a day throughout the day. Avoid standing for long periods of time. Exercise regularly Off-Loading Other: - When sitting please keep your feet up, keep legs at Heart Level or above. Use pillows behind calf to for comfort. Additional Orders / Instructions Follow Nutritious Diet - Try and increase protein intake. Goal of protein intake is 60g-100g. Lymphedema Treatment Plan - Exercise, Compression and Elevation Bilateral Lower Extremities Exercise daily as tolerated. (Walking,  ROM, Calf Pumps and Toe Taps) Elevate legs 30 - 60 minutes at or above heart level at least 3 - 4 times daily as able/tolerated Avoid standing for long periods and elevate leg(s) parallel to the floor when sitting Use Pneumatic Compression Device on leg(s) 2-3 times a day for 45-60 minutes - x3 a day Wound Treatment Thede, [name] [name] ([id number]) [id number] Page 7 of 16 Wound #12 - Lower Leg Wound Laterality: Left, Anterior Cleanser: Soap and Water 1 x Per Week/30 Days Discharge Instructions: May shower and wash wound with dial antibacterial soap and water prior to dressing change. Peri-Wound Care: Sween Lotion (Moisturizing lotion) 1 x Per Week/30 Days Discharge Instructions: Apply moisturizing lotion as directed Prim Dressing: Maxorb Extra CMC/Alginate Dressing, 4x4 (in/in) 1 x Per Week/30 Days ary Discharge Instructions: Apply to wound bed as instructed Secondary Dressing: ABD Pad, 8x10 1 x Per Week/30 Days Discharge Instructions: Apply over primary dressing as directed. Secondary Dressing: Zetuvit Plus 4x8 in 1 x Per Week/30 Days Discharge Instructions: Apply over primary dressing as directed. Secondary Dressing: OptiLock Super Absorbent, 5x5.5 (in/in) (Generic) 1 x Per Week/30 Days Discharge Instructions: Apply directly to wound bed as directed Compression Wrap: Urgo K2, (equivalent to a 4 layer) two layer compression system, regular 1 x Per Week/30 Days Discharge Instructions: Apply Urgo K2 as directed (alternative to 4 layer compression). Wound #15 - Lower Leg Wound Laterality: Left, Posterior, Distal Cleanser: Soap and Water 1 x Per Week/30 Days Discharge Instructions: May shower and wash wound with dial antibacterial soap and water prior to dressing change. Peri-Wound Care: Sween Lotion (Moisturizing lotion) 1 x Per Week/30 Days Discharge Instructions: Apply moisturizing lotion as directed Prim Dressing: Maxorb Extra CMC/Alginate Dressing, 4x4 (in/in) 1 x  Per Week/30 Days ary Discharge Instructions: Apply to wound bed as instructed Secondary Dressing: ABD Pad, 8x10 1 x Per Week/30 Days Discharge Instructions: Apply over primary dressing as directed. Secondary Dressing: Zetuvit Plus 4x8 in 1 x Per Week/30 Days Discharge Instructions: Apply over primary dressing as directed. Secondary Dressing: OptiLock Super Absorbent, 5x5.5 (in/in) (Generic) 1 x Per Week/30 Days Discharge Instructions: Apply directly to wound bed as directed Compression Wrap: Urgo K2, (equivalent to a 4 layer) two layer compression system, regular 1 x  Per Week/30 Days Discharge Instructions: Apply Urgo K2 as directed (alternative to 4 layer compression). Wound #16 - Lower Leg Wound Laterality: Left, Lateral Cleanser: Soap and Water 1 x Per Week/30 Days Discharge Instructions: May shower and wash wound with dial antibacterial soap and water prior to dressing change. Peri-Wound Care: Sween Lotion (Moisturizing lotion) 1 x Per Week/30 Days Discharge Instructions: Apply moisturizing lotion as directed Prim Dressing: Maxorb Extra CMC/Alginate Dressing, 4x4 (in/in) 1 x Per Week/30 Days ary Discharge Instructions: Apply to wound bed as instructed Secondary Dressing: ABD Pad, 8x10 1 x Per Week/30 Days Discharge Instructions: Apply over primary dressing as directed. Secondary Dressing: Zetuvit Plus 4x8 in 1 x Per Week/30 Days Discharge Instructions: Apply over primary dressing as directed. Secondary Dressing: OptiLock Super Absorbent, 5x5.5 (in/in) (Generic) 1 x Per Week/30 Days Discharge Instructions: Apply directly to wound bed as directed Compression Wrap: Urgo K2, (equivalent to a 4 layer) two layer compression system, regular 1 x Per Week/30 Days Discharge Instructions: Apply Urgo K2 as directed (alternative to 4 layer compression). Wound #6 - Lower Leg Wound Laterality: Right, Medial Cleanser: Soap and Water 1 x Per Week/30 Days Discharge Instructions: May shower and wash  wound with dial antibacterial soap and water prior to dressing change. Peri-Wound Care: Sween Lotion (Moisturizing lotion) 1 x Per Week/30 Days Discharge Instructions: Apply moisturizing lotion as directed Prim Dressing: Maxorb Extra CMC/Alginate Dressing, 4x4 (in/in) 1 x Per Week/30 Days ary Discharge Instructions: Apply to wound bed as instructed Benedetti, Maximum ([id number]) [id number] Page 8 of 16 Secondary Dressing: ABD Pad, 8x10 1 x Per Week/30 Days Discharge Instructions: Apply over primary dressing as directed. Secondary Dressing: Zetuvit Plus 4x8 in 1 x Per Week/30 Days Discharge Instructions: Apply over primary dressing as directed. Secondary Dressing: OptiLock Super Absorbent, 5x5.5 (in/in) (Generic) 1 x Per Week/30 Days Discharge Instructions: Apply directly to wound bed as directed Compression Wrap: Urgo K2, (equivalent to a 4 layer) two layer compression system, regular 1 x Per Week/30 Days Discharge Instructions: Apply Urgo K2 as directed (alternative to 4 layer compression). Wound #7 - Lower Leg Wound Laterality: Left, Medial Cleanser: Soap and Water 1 x Per Week/30 Days Discharge Instructions: May shower and wash wound with dial antibacterial soap and water prior to dressing change. Peri-Wound Care: Sween Lotion (Moisturizing lotion) 1 x Per Week/30 Days Discharge Instructions: Apply moisturizing lotion as directed Prim Dressing: Maxorb Extra CMC/Alginate Dressing, 4x4 (in/in) 1 x Per Week/30 Days ary Discharge Instructions: Apply to wound bed as instructed Secondary Dressing: ABD Pad, 8x10 1 x Per Week/30 Days Discharge Instructions: Apply over primary dressing as directed. Secondary Dressing: Zetuvit Plus 4x8 in 1 x Per Week/30 Days Discharge Instructions: Apply over primary dressing as directed. Secondary Dressing: OptiLock Super Absorbent, 5x5.5 (in/in) (Generic) 1 x Per Week/30 Days Discharge Instructions: Apply directly to wound bed as  directed Compression Wrap: Urgo K2, (equivalent to a 4 layer) two layer compression system, regular 1 x Per Week/30 Days Discharge Instructions: Apply Urgo K2 as directed (alternative to 4 layer compression). Patient Medications llergies: cephalexin, mometasone furoate, doxycycline monohydrate A Notifications Medication Indication Start End 04/08/2023 lidocaine DOSE topical 4 % cream - cream topical once daily Electronic Signature(s) Signed: 04/08/2023 4:37:54 PM By: [name] Najjar MD Signed: 04/08/2023 5:07:51 PM By: [name] Pulling RN, [name] Entered By: [name] [name] on 04/08/2023 16:25:58 -------------------------------------------------------------------------------- Problem List Details Patient Name: Date of Service: [name], [name] [name] 04/08/2023 2:45 PM Medical Record Number: [id number] Patient Account Number: [id number] Date of Birth/Sex: Treating RN:  [birth year] (77 y.o. M) Primary Care Provider: Ricki [name] Other Clinician: Referring Provider: Treating Provider/Extender: [name] [name] Weeks in Treatment: 21 Active Problems ICD-10 Encounter Code Description Active Date MDM Diagnosis I87.333 Chronic venous hypertension (idiopathic) with ulcer and inflammation of 06/11/2022 No Yes bilateral lower extremity [name], [name] ([id number]) [id number] Page 9 of 16 I89.0 Lymphedema, not elsewhere classified 03/26/2022 No Yes L97.828 Non-pressure chronic ulcer of other part of left lower leg with other specified 03/26/2022 No Yes severity L97.818 Non-pressure chronic ulcer of other part of right lower leg with other specified 03/26/2022 No Yes severity Inactive Problems ICD-10 Code Description Active Date Inactive Date L84 Corns and callosities 10/15/2022 10/15/2022 Resolved Problems Electronic Signature(s) Signed: 04/08/2023 4:37:54 PM By: [name] Najjar MD Entered By: [name] [name] on 04/08/2023  16:27:45 -------------------------------------------------------------------------------- Progress Note Details Patient Name: Date of Service: [name], [name] [name] 04/08/2023 2:45 PM Medical Record Number: [id number] Patient Account Number: [id number] Date of Birth/Sex: Treating RN: May 24, 1945 (77 y.o. M) Primary Care Provider: Ricki [name] Other Clinician: Referring Provider: Treating Provider/Extender: [name] [name] Weeks in Treatment: 64 Subjective History of Present Illness (HPI) ADMISSION 02/23/2020 Patient is a 77 year old man who lives in [place] who arrives accompanied by his wife. He has a history of chronic lymphedema and venous insufficiency in his bilateral lower legs which may have something to do that with having a history of DVT as well as being treated for prostate cancer. In any case he recently got compression pumps at home but compliance has been an issue here. He has compression stockings however they are probably not sufficient enough to control swelling. They tell us that things deteriorated for him in late August he was admitted to [name] for 7 days. This was with cellulitis I think of his bilateral lower legs. Discharge he was noted to have wounds on his bilateral lower legs. He was discharged on Bactrim. They tried to get him home health through [facility] of course they declined him. His wife is been wrapping these applying some form of silver foam dressing. He has a history of wounds before although nothing that would not heal with basic home topical dressings. He has 2 areas on the left medial, left anterior and left lateral and a smaller area on the right medial. All of these have considerable depth. Past medical history includes iron deficiency anemia, lymphedema followed by the rehab [name] at [facility] with lymphedema wraps I believe, DVT on chronic anticoagulation, prostate cancer, chronic venous insufficiency,  hypertension. As mentioned he has compression pumps but does not use them. ABIs in our clinic were noncompressible bilaterally 10/14; patient with severe bilateral lymphedema right greater than left. He came in with bilateral lower extremity wounds left greater than right. Even though the right side has more of the edema most of the wounds here almost closed on the right medial. He has 3 remaining wounds on the left We have been using silver alginate under 4-layer compression I have been trying to get him to be compliant with his external compression pumps 10/21; patient with 3 small wounds on the left leg and 1 on the right medial in the setting of severe lymphedema and chronic venous insufficiency. We have been using silver alginate under 4-layer compression he is using his external compression pumps twice a day 11/4; ARTERIAL STUDIES on the right show an ABI of 1.02 TBI of 0.858 with biphasic waveforms on the left 0.98 with a TBI  of 0.55 and biphasic waveforms. Does not look like he has significant arterial disease. We are treating him for lymphedema he has compression pumps. He has punched-out areas on the left anterior left lateral and right medial lower extremities [name], [name] [name] ([id number]) [id number] Page 10 of 16 11/11; after we obtained his arterial studies I put him in 4 layer compression. He is using his compression pumps probably once a day although I have asked him to do twice. Primary dressing to the wound is silver collagen he has severe lymphedema likely secondary to chronic venous insufficiency. Wounds on the left lateral, left medial and left anterior and a small area on the right medial 12/2; the area on the right anterior lower leg has healed. We initially thought that the area medially had healed as well however when her discharge nurse came in she detected fluid in the wound simply opened up. This is actually worse than I remember this pain. The  area on the left lateral potentially slightly smaller He is also complaining about pain in his left hand he says that this is actually been getting some better he has been using topical creams on this. She asked that I look at this 12/9 after last weeks issues we have 2 wounds one on the right medial lower leg and 1 on the left lateral. Both of these are in the same condition. I think because of thickened skin secondary to chronic lymphedema these wounds actually have depth of almost 0.8 cm. 12/16; the patient has 2 small but deep wounds one on the right medial and one on the left lateral. The right medial is actually the worst of these. He arrives in clinic today with absolutely terrible edema in the right leg apparently his 4-layer wrap fell down to just above his ankle he did not think about this he is apparently been continuing to use his compression pump twice a day. The left leg looks a lot better. 05/09/2020 upon evaluation today patient appears to be doing decently well in regard to his wounds. Everything is measuring smaller the right leg still has a little bit deeper wound in the left seems to be almost completely healed in my opinion I am very pleased in general with how things are progressing. He has a 4- layer compression wrap we have been using endoform today we will probably have to use collagen just based on the fact that we do not have endoform it is on order. 1/6; the patient's wound on the left lateral lower leg has healed. Still has 1 on the right medial. 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 Using compression pumps at home twice a day. 1/13; left lateral lower leg is still healed. He has a deep punched out rectangular shaped wound on the right medial calf. Looking down at this it appears that he is attempting to epithelialize around the edges of the wound and on the base as well. His edema is reasonably well controlled we have been using collagen  with absolutely no effect 1/20; left lateral lower leg remains closed he has extremitease stockings. The area on the right medial calf I aggressively debrided last week measures larger but the surface looks better. We have been using Hydrofera Blue. We ran Oasis through his insurance but we have not seen the results of this 1/27; left lower leg wound with chronic venous insufficiency and secondary lymphedema. I did aggressive debridement on this last week the wound seems to have come in healthy  looking surface using Hydrofera Blue. He was denied for Oasis 2/3; small divot in the right medial lower leg. Under illumination the walls of this divot are epithelialized however the base has slough which I removed with a curette we have been using Hydrofera Blue 2/10 small divot on the right medial lower leg pinpoint illumination at the base of this cone-shaped wound. We have been using Hydrofera Blue but I will switch to calcium alginate this week 2/17; the small divot on the right medial lower leg is fully epithelialized. There is no visible open area under illumination. He has his own stocking for the right leg similar to the one he has been wearing on the left. 03/26/2022; READMISSION This is a now 77 year old man that we had in the clinic from 02/23/2020 through 07/05/2020. At that point he had bilateral lower extremity wounds left greater than right in the setting of severe lymphedema. He had already obtained compression pumps ordered for him I think from the wound care clinic in [facility] so I do not really have record of what he has been using. He claims to be using them once a day but there is a problem with the sleeve on the left leg. About 2 weeks ago he was hospitalized from 03/11/2022 through 03/14/2022 with diastolic congestive heart failure. 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 He was diuresed. Developed some prerenal azotemia and  he has not been taking any diuretics currently. He has not been putting stockings on his legs since he got out of hospital and still has his legs dependent for long periods. Past medical history history of prostate cancer treated with prostatectomy and radiation this was apparently about 8 years ago, history of DVT on chronic Coumadin, history of lymphedema was managed for a while at the clinic in [place]. History of inguinal hernia repair in September 22, hypertension, stage IIIb chronic renal failure ABIs today were noncompressible on the right 1.12 on the left 04-02-2022 upon evaluation today patient appears to be doing well currently in regard to his legs I do feel like both areas that are draining are actually much drier than they were in the picture last week although the left is drier than the right. He is tolerating the 4-layer compression wraps at this point he did contact the pump company and they are actually working on getting him a new compression sleeve for one of his legs which have previously popped and was not functioning properly. 04-23-2022 upon evaluation today patient appears to be doing well currently in regard to his wounds on the legs. I am actually very pleased with where things stand and I do feel like that we are headed in the right direction. Fortunately there is no sign of active infection locally or systemically at this time. 05-07-2022 upon evaluation today patient appears to be doing well currently in regard to his wounds in fact things are showing signs of improvement which is good news I do not see too much that actually appears to be open and I am very pleased in that regard. No fevers, chills, nausea, vomiting, or diarrhea. 05-21-2022 upon evaluation today patient appears to be doing somewhat poorly in regard to drainage of his lower extremities bilaterally. The right is greater than left as far as the weeping area. Nonetheless it seems to be getting worse not  better. He actually has pitting edema which is at least 2+ to the thighs and I am  concerned about the fact that he is may be fluid overloaded in general and that is the reason why we cannot get this under control. I know he is not using his pumps all the time because he actually told the nurse that he was either going to pump or he was going to use his fluid pills but not do both. For that reason I do think that he needs to be really doing both in order to get the fluid out as effectively as possible obviously with the 4-layer compression wraps were doing as much as we can from a compression standpoint but it is really not enough. He tells me that he elevates his leg is much as he can in between pumping and other activity throughout the day. 05-28-2022 upon evaluation today patient appears to be doing better in regard to his wounds although the measurements may be a little bit larger this is a very difficult wound to heal it is very indistinct in a lot of areas. Nonetheless there is can be some need for sharp debridement in regard to both medial and lateral legs. Fortunately I see no signs of active infection locally nor systemically at this time. No fevers, chills, nausea, vomiting, or diarrhea. 06-04-2022 upon evaluation today patient appears to be doing poorly in general in regard to the wounds on his legs. He still continues to have a tremendous amount of fluid not just in the lower portion of his leg but to be honest his thigh where he has 2-3+ pitting edema in the thigh as well. Unfortunately I do not know that we will be able to get this healed effectively and keep it healed on the lower extremities unless he gets the overall fluid situation taking and under control. Fortunately I do not see any signs of infection locally nor systemically which is great news. He just seems to be very fluid overloaded. 06-11-2022 upon evaluation today patient presents for follow-up concerning his bilateral lower  extremity lymphedema secondary to chronic venous insufficiency. He has been tolerating the dressing changes with the compression wraps without complication. Fortunately I do not see any evidence of infection at this time which is great news. No fevers, chills, nausea, vomiting, or diarrhea. 06-18-2022 upon evaluation today patient appears to be doing well currently in regard to his wounds as far as not looking like they are terribly infected but nonetheless I am concerned about a subacute infection secondary to the fact that he continues to have spreading despite the compression therapy. We actually did do an Unna boot on him last week this is actually the first wrap that actually stayed up everything else has been sliding down quite significantly. Fortunately there does not appear to be any signs of infection systemically at this time. With that being said I do believe that locally there seems to be an issue going on here and again I Ernie Hew do a PCR culture to see what that shows also think that I am going to put him on a broad-spectrum antibiotic, doxycycline to see how that will help as well. He does tell me that coming into the clinic today that he was feeling short of breath like "he was about to have a Coxe, Yisroel ([id number]) [phone].[email or web address] Page 11 of 16 heart attack" because he was having such a hard time breathing. He says that he told this to Dr. Jodelle Green his cardiologist as well when he was evaluated in the past 1 to 2 weeks. 07-02-2022 upon evaluation today patient  appears to be doing poorly currently in regard to his wound. He has been tolerating the dressing changes. Unfortunately he has not had any compression wraps on for the past week because he was unable to make it in for his appointment last week. With that being said he has a significant amount of drainage he tells me has been using his pumps but despite this in the pumps he still has been draining quite a  bit. The drainage is also somewhat purulent unfortunately. We did attempt to get in touch with his cardiologist last week unfortunately we were unable to get up with him I did advise that the patient needs to get in touch with him upon leaving today in order to make sure they know he is on the new antibiotics I am going to send him this will be Levaquin and Augmentin. 07-09-2022 upon evaluation today patient appears to be doing about the same in regard to his legs he may have just a slight amount of improvement with regard to the drainage probably Keystone topical antibiotics are helping in this regard to some degree. Fortunately there does not appear to be any signs of active infection systemically which is great news. No fevers, chills, nausea, vomiting, or diarrhea. 07-16-2022 upon evaluation today patient appears to be doing well currently in regard to his wound. He has been tolerating the dressing changes without complication. Fortunately there does not appear to be any signs of active infection locally nor systemically at this time. With that being said he cannot keep the wraps up he tells me on the left side he had to cut this down because it got too tight. He has been using his pumps but he is on the right side the wrap actually straight down causing some pushing around the central part of his leg just below the calf I think this is a bigger risk for him that help at this point. I think that we may need to try something different he should be getting his compression socks shortly he tells me they were ordered last Thursday. 3/6; ; this is a patient who lives in [place]. He has severe bilateral lymphedema. He has compression pumps, we have been using kerlix Ace wrap Keystone. He is changing the dressing. We do not have home health. 08-06-2022 upon evaluation today patient appears to be doing a little better in regard to his wounds in general at this point. Fortunately there does not appear to be  any signs of active infection locally nor systemically at this time which is great news and overall I am extremely pleased with where we stand today. 08-13-2022 upon evaluation today patient appears to actually be doing significantly better compared to last week. He actually did go to the hospital I told him that he needed to when he left here and he actually did go. With that being said they actually ended up admitting him he was having shortness of breath and I thought it might be related to congestive heart failure turns out he actually had a pulmonary embolism. Subsequently they were able to get him off of the Coumadin switching over to Eliquis to get things stabilized in that regard they also had them wrapped and got his swelling under control on his legs he actually looks much better pretty much across the board at this point. I am very pleased in that regard. With that being said I am very happy that he finally went that could have been a very dangerous  situation. 08-20-2022 upon evaluation today patient appears to be doing well currently in regard to his wound. Has been tolerating the dressing changes without complication. Fortunately there does not appear to be any signs of active infection locally nor systemically at this time. I think his legs are doing better there is some need for sharp debridement today. 08-27-2022 upon evaluation patient is actually making excellent progress. I am actually very pleased with where he stands and I think that he is moving in the right direction. In general I think that we are looking pretty good at the moment. 09-03-2022 upon evaluation today patient appears to be doing well currently in regard to his wound. He is actually tolerating dressing changes on the left and right leg without complication. Fortunately I do not see any need for debridement of the left leg the right leg I think we probably do some need to perform some debridement here. 09-10-2022 upon  evaluation today patient's wounds actually showed signs of improvement in both legs I do not see much is going require debridement today which was great news. Fortunately I do not see any evidence of infection which I think is also excellent he seems to be using his pumps and doing everything right I am happy about how this is progressing at this point. 09-17-2022 upon evaluation today patient appears to be doing decently well in regard to his wounds. He has been tolerating the dressing changes without complication. Fortunately there does not appear to be any signs of active infection at this time which is good news. 09-24-2022 upon evaluation today patient appears to be doing well currently in regard to his wounds. He has been tolerating the dressing changes without complication. Fortunately there does not appear to be any signs of active infection locally nor systemically which is great news. No fevers, chills, nausea, vomiting, or diarrhea. 10-08-2022 upon evaluation today patient appears to be doing excellent currently in regard to his wound. He has been tolerating the dressing changes without complication though it does not sound like he has been using his compression wraps for a bit here. He does think he was doing better with the [name] topical antibiotics we can definitely go back to using that but I think the biggest issue here is that his swelling is just very out of control and needs to be under control. I discussed that with him today. 10-15-2022 upon evaluation today patient appears to be doing well currently in regard to his wounds. He is actually making some progress which is good news. Fortunately I do not see any evidence of active infection locally nor systemically which is great news as well. No fevers, chills, nausea, vomiting, or diarrhea. He does have a callused area on the plantar aspect of his left foot which is actually causing some pain and he wonders if I can trim this down for  him. 10-22-2022 upon evaluation today patient seems to be making progress. He is actually doing quite well and very pleased in that regard. I do not see any signs of active infection at this time. 11-05-2022 upon evaluation patient appears to be making progress although slowly towards closure. He seems to be doing well with regard to his legs were still using the [name] topical antibiotics and he seems to be doing quite well. He is going require some sharp debridement today. 11-19-2022 upon evaluation today patient unfortunately appears to be extremely swollen at this point. He tells me that he ran out of supplies he also  tells me his leg started leaking more because of not having supplies he was unable to wear his compression wraps that is the juxta fit compression wraps. Therefore his legs are extremely swollen much larger than normal and do not appear to be doing well at all today. He is going require some debridement I also think he is going require [place] to perform compression wrapping today. 11-26-2022 upon evaluation today patient appears to be doing well currently in regard to his legs as far as infection is concerned I see nothing that appears to be infected. Fortunately I do not see any signs of active infection systemically either which is also good news. With that being said he still is extremely swollen as far as his legs are concerned. I do not see any signs of overall worsening but also do not see any signs of significant improvement which is the major issue here. 12-03-2022 upon evaluation today patient appears to be doing poorly still in regard to his legs the apparently has been taken off the wraps and not leaving the week by week. With that being said he has not had really the dressings to put on the release that is running out and subsequently though he is using his wraps I am not sure that he has been keeping them on like he needs to. I explained to him I really wanted to have the wraps  that I put on him left on until he comes back to see me so that we can keep the compression under better control. He voiced understanding today he tells me that he was "confused about that". Nonetheless based on what I am seeing right now also think that he is up on his feet too much I think he needs to get the feet elevated. 7/24; this is a patient with severe bilateral stage III lymphedema. On the right lower leg medially is a large area of macerated denuded skin was too small deeper areas in this. On the left leg he is 2 areas that are a little more standard in terms of wounds. Massive lymphedema bilaterally. He has compression pumps at home which he uses for 1 hour twice a day 12-17-2022 upon evaluation today patient's legs though a little bit smaller still continue to have significant issues with weeping here this just does not dry. I really think he needs to be changing this daily and this means we will probably have to try to go back to the Velcro wraps and give this a shot. I think may be going [name], [name] ([id number]) [id number] Page 12 of 16 to the Velcro wraps, changing the dressing to a superabsorber like XtraSorb or the like, and have him elevate his legs and do his pumps 3 times per day is probably going to be the way to go to try to see if we can make some improvement here. 12-24-2022 upon evaluation today patient unfortunately is still struggling to get anything under control. We have been extremely aggressive and trying to: Control his swelling everything we do however seems to be met with resistance. He tells me that he is pumping 3 times per day with his lymphedema pumps. He also tells me that he is try to elevate his legs is much as possible and that subsequently he has been keeping his compression wraps on. He goes back and forth between saying that he can keep our wraps on and not but right now I really do not see that he is doing well with  the juxta  fit is at least not from the standpoint of improving the overall appearance I think it does to some degree help maintain but we are still struggling here. I have voiced a concern to the patient that despite everything you are doing I am still not seeing a lot of improvement here and to be honest I am not sure what is can take get this under control he may have to go into the hospital for admission. 12-31-2022 upon evaluation today patient appears to be doing well currently in regard to his wounds. They seem a little better although he has several pustules around the legs in general but has been concerned about infection. I am actually going to go ahead and see about getting an antibiotic sent into the pharmacy today although I am going to obtain a PCR culture as well. 01-07-2023 upon evaluation today patient appears to be doing well currently in regard to his wound. Has been tolerating the dressing changes without complication. He actually seems to be doing much better. He did have a PCR culture which showed Serratia as well and the prominent organisms here. Levaquin was actually a good option to treat the Serratia along with the other organisms that were problematic including E. coli. Nonetheless I think coupled with the topical Keystone antibiotics and the alginate he is really doing quite well today. 8/28; patient with severe bilateral lymphedema he has wounds bilaterally on his lower legs but they look a lot better today. We are using silver alginate and Keystone with Urgo K2 lite wraps. He comes in today with his wounds looking quite good. He was prescribed Levaquin last time I think he will complete this in a few days. 01-21-2023 upon evaluation today patient appears to be doing worse in regard to his swelling took his wrap off he tells me he believes on Saturday. With that being said his leg is significantly swollen since that time and unfortunately does not appear to be doing nearly as good as it  was last time I saw him 2 weeks ago or even last week for that matter. I am very concerned about this. 01-28-2023 upon evaluation today patient appears to be doing poorly currently in regard to his legs. He has been doing everything he says that we have recommended, he continues to use his lymphedema pumps 3 times a day, he has been doubling up on his fluid pills, he has been elevating his legs, and he tells me that he also has kept the wraps on and in fact he did have them on the day when he came in. Again that for feels the compression side of things. Nonetheless with everything going on here we still have not been able to get this completely under control. He continues to have issues with significant swelling of lower extremities and this is not limited to just his lower left portion of his legs but his thighs are fairly large as well. With that being said I really feel like that we have reached the extent of what we have to offer here at the wound care [name] I am wondering if we can see about making a referral for second opinion to [name] to see if there is anything they could do to help him out at this point. It is really not much further from his home to there as it is from his home to here. Both are about an hour distance. He lives in [place]. 02-04-2023 upon evaluation today  patient appears to be doing much better actually compared to last week's evaluation. This is actually significant improvement and overall I do not know exactly what he did different other than he tells me that he "has been taking his fluid pills every day instead of every other day. Obviously this is needed. I was under the understanding previously that he was taking this every day but I think he may also not been pumping every day like he was telling me but I cannot be sure this either way he does seem to be doing what he supposed to do at this point. 02-11-2023 upon evaluation today patient unfortunately  appears to be about as bad today as he appeared good last week. I am not sure what happened other than the fact he did take his wrap off the right leg he tells me it got so tight that he could not feel his toes they are getting very numb. Subsequently he tells me that it was difficulty with driving therefore he took it off. With that being said I do believe that he may need to go to the ER for evaluation and treatment I feel he may need to be admitted to get some of the fluid off of his legs. I think that he is volume overloaded and at this point I do not think there is much working to be able to do to manage this outpatient we have tried pretty much everything that I am going to do. 02-18-23 upon evaluation today patient appears to be doing pretty well currently compared to last week this is actually better today. He is less weeping and the legs are actually significantly smaller. He tells me has been trying to stay in bed with his legs elevated more. 02-25-2023 upon evaluation today patient appears to be doing well currently in regard to his legs in general compared to where we have been. Fortunately I do not see any signs of active infection locally or systemically which is great news and in general I do believe that we are making good headway here towards complete closure. I am hopeful that he will continue to show signs of improvement as we progress going forward. He does seem to have been taking his fluid pills as well as elevating his legs and using lymphedema pumps on a regular basis. He is trying to stay out of the hospital. 03-04-2023 upon evaluation today patient appears to be doing well currently in regard to his wound. He has been tolerating the dressing changes without complication. Fortunately there does not appear to be any signs of active infection at this time. 03-11-2023 upon evaluation today patient appears to be doing well currently in regard to his wounds. He has been tolerating the  dressing changes without complication. Fortunately I do not see any signs of active infection at this time which is great news. 11/6; patient missed his clinic appointment last week. He took his wraps off at some point. Did not take his diuretics because he was nauseated last week. He comes in with massive bilateral edema which is lymphedema and probably some degree of systemic fluid overload. 11/13; patient with very poorly controlled lymphedema. We have been putting 4-layer equivalent wraps on him he says he is using his compression pumps twice a day. And elevating his legs nevertheless the edema control today is very poor. He has a substantial wound on the right medial lower leg also of the left medial and left posterior. We have been using alginate  as a primary dressing or Aquacel Ag 11/20; some improvement in the amount of edema. We have been using 4-layer equivalent wraps and he is pumping with his external compression pumps twice a day. His legs had improved in terms of edema although there is still massively edematous. He tells me that he is on torsemide 1-1/2 tablets 20 mg tabs a day Objective Constitutional Patient is hypertensive.. Pulse regular and within target range for patient.Marland Kitchen Respirations regular, non-labored and within target range.. Temperature is normal and within the target range for the patient.Marland Kitchen Appears in no distress. Vitals Time Taken: 4:02 PM, Height: 74 in, Weight: 250 lbs, BMI: 32.1, Temperature: 97.8 F, Pulse: 82 bpm, Respiratory Rate: 18 breaths/min, Blood Pressure: 176/70 mmHg. Pruss, [name] [name] ([id number]) [id number] Page 13 of 16 General Notes: Wound exam; substantial area on the right medial lower leg, left medial and left posterior. Severe bilateral lymphedema although I think this is somewhat better than last week. No evidence of cellulitis nothing looks acute here. Severe stage III lymphedema Integumentary (Hair, Skin) Wound #12  status is Open. Original cause of wound was Frostbite. The date acquired was: 12/17/2022. The wound has been in treatment 16 weeks. The wound is located on the Left,Anterior Lower Leg. The wound measures 3.5cm length x 2cm width x 0.1cm depth; 5.498cm^2 area and 0.55cm^3 volume. There is Fat Layer (Subcutaneous Tissue) exposed. There is no tunneling or undermining noted. There is a large amount of serous drainage noted. The wound margin is distinct with the outline attached to the wound base. There is medium (34-66%) red, pink granulation within the wound bed. There is a medium (34-66%) amount of necrotic tissue within the wound bed including Adherent Slough. The periwound skin appearance exhibited: Hemosiderin Staining. The periwound skin appearance did not exhibit: Callus, Crepitus, Excoriation, Induration, Rash, Scarring, Dry/Scaly, Maceration, Atrophie Blanche, Cyanosis, Ecchymosis, Mottled, Pallor, Rubor, Erythema. Periwound temperature was noted as No Abnormality. Wound #15 status is Open. Original cause of wound was Gradually Appeared. The date acquired was: 01/14/2023. The wound has been in treatment 7 weeks. The wound is located on the Left,Distal,Posterior Lower Leg. The wound measures 3.5cm length x 5cm width x 0.1cm depth; 13.744cm^2 area and 1.374cm^3 volume. There is Fat Layer (Subcutaneous Tissue) exposed. There is no tunneling or undermining noted. There is a medium amount of serosanguineous drainage noted. The wound margin is distinct with the outline attached to the wound base. There is small (1-33%) pink granulation within the wound bed. There is a large (67-100%) amount of necrotic tissue within the wound bed including Adherent Slough. The periwound skin appearance exhibited: Hemosiderin Staining. The periwound skin appearance did not exhibit: Callus, Crepitus, Excoriation, Induration, Rash, Scarring, Dry/Scaly, Maceration, Atrophie Blanche, Cyanosis, Ecchymosis, Mottled, Pallor,  Rubor, Erythema. Wound #16 status is Open. Original cause of wound was Blister. The date acquired was: 04/01/2023. The wound has been in treatment 1 weeks. The wound is located on the Left,Lateral Lower Leg. The wound measures 2cm length x 2.5cm width x 0.1cm depth; 3.927cm^2 area and 0.393cm^3 volume. There is no tunneling or undermining noted. There is a medium amount of serous drainage noted. The wound margin is distinct with the outline attached to the wound base. There is large (67-100%) red granulation within the wound bed. There is no necrotic tissue within the wound bed. The periwound skin appearance did not exhibit: Callus, Crepitus, Excoriation, Induration, Rash, Scarring, Dry/Scaly, Maceration, Atrophie Blanche, Cyanosis, Ecchymosis, Hemosiderin Staining, Mottled, Pallor, Rubor, Erythema. Wound #6 status is Open. Original cause  of wound was Gradually Appeared. The date acquired was: 03/05/2022. The wound has been in treatment 54 weeks. The wound is located on the Right,Medial Lower Leg. The wound measures 8cm length x 9cm width x 0.3cm depth; 56.549cm^2 area and 16.965cm^3 volume. There is Fat Layer (Subcutaneous Tissue) exposed. There is no tunneling or undermining noted. There is a large amount of serous drainage noted. The wound margin is distinct with the outline attached to the wound base. There is small (1-33%) pink granulation within the wound bed. There is a large (67-100%) amount of necrotic tissue within the wound bed including Adherent Slough. The periwound skin appearance exhibited: Scarring, Hemosiderin Staining. The periwound skin appearance did not exhibit: Callus, Crepitus, Excoriation, Induration, Rash, Dry/Scaly, Maceration, Atrophie Blanche, Cyanosis, Ecchymosis, Mottled, Pallor, Rubor, Erythema. Periwound temperature was noted as No Abnormality. Wound #7 status is Open. Original cause of wound was Gradually Appeared. The date acquired was: 03/05/2022. The wound has been  in treatment 54 weeks. The wound is located on the Left,Medial Lower Leg. The wound measures 5cm length x 4.5cm width x 0.1cm depth; 17.671cm^2 area and 1.767cm^3 volume. There is Fat Layer (Subcutaneous Tissue) exposed. There is no tunneling or undermining noted. There is a large amount of serous drainage noted. The wound margin is distinct with the outline attached to the wound base. There is small (1-33%) red granulation within the wound bed. There is a large (67-100%) amount of necrotic tissue within the wound bed including Eschar. The periwound skin appearance exhibited: Scarring, Hemosiderin Staining. The periwound skin appearance did not exhibit: Callus, Crepitus, Excoriation, Induration, Rash, Dry/Scaly, Maceration, Atrophie Blanche, Cyanosis, Ecchymosis, Mottled, Pallor, Rubor, Erythema. Periwound temperature was noted as No Abnormality. 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 There was a Excisional Skin/Subcutaneous Tissue Debridement with a total area of 4.4 sq cm performed by [name] Caul., MD. With the following instrument(s): Curette to remove Non-Viable tissue/material. Material removed includes Subcutaneous Tissue and Slough and after achieving pain control using Lidocaine 4% T opical Solution. No specimens were taken. A time out was conducted at 16:20, prior to the start of the procedure. A Minimum amount of bleeding was controlled with Pressure. The procedure was tolerated well. Post Debridement Measurements: 3.5cm length x 2cm width x 0.1cm depth; 0.55cm^3  volume. Character of Wound/Ulcer Post Debridement is improved. Post procedure Diagnosis Wound #12: Same as Pre-Procedure Pre-procedure diagnosis of Wound #12 is a Lymphedema located on the Left,Anterior Lower Leg . There was a Four Layer Compression Therapy Procedure by [name] Pulling, RN. Post procedure Diagnosis Wound #12: Same as Pre-Procedure Wound #6 Pre-procedure diagnosis of Wound #6 is a Lymphedema located on the Right,Medial Lower Leg . There was a Excisional Skin/Subcutaneous Tissue Debridement with a total area of 28.26 sq cm performed by [name] Caul., MD. With the following instrument(s): Curette to remove Non-Viable tissue/material. Material removed includes Subcutaneous Tissue and Slough and after achieving pain control using Lidocaine 4% T opical Solution. No specimens were taken. A time out was conducted at 16:20, prior to the start of the procedure. A Minimum amount of bleeding was controlled with Pressure. The procedure was tolerated well. Post Debridement  Measurements: 8cm length x 9cm width x 0.3cm depth; 16.965cm^3 volume. Character of Wound/Ulcer Post Debridement is improved. Post procedure Diagnosis Wound #6: Same as Pre-Procedure Pre-procedure diagnosis of Wound #6 is a Lymphedema located on the Right,Medial Lower Leg . There was a Four Layer Compression Therapy Procedure by Callejo, [name] [name] ([id number]) [id number] Page 14 of 16 [name] [name], Charity fundraiser. Post procedure Diagnosis Wound #6: Same as Pre-Procedure Wound #15 Pre-procedure diagnosis of Wound #15 is a Lymphedema located on the Left,Distal,Posterior Lower Leg . There was a Four Layer Compression Therapy Procedure by [name] Pulling, RN. Post procedure Diagnosis Wound #15: Same as Pre-Procedure Wound #16 Pre-procedure diagnosis of Wound #16 is a Venous Leg Ulcer located on the Left,Lateral Lower Leg . There was a Four Layer Compression Therapy Procedure by [name] Pulling, RN. Post  procedure Diagnosis Wound #16: Same as Pre-Procedure Wound #7 Pre-procedure diagnosis of Wound #7 is a Lymphedema located on the Left,Medial Lower Leg . There was a Four Layer Compression Therapy Procedure by [name] Pulling, RN. Post procedure Diagnosis Wound #7: Same as Pre-Procedure Plan Follow-up Appointments: Return Appointment in 2 weeks. - Dr Leanord Hawking Return appointment in 3 weeks. - Wednesday Dr. Leanord Hawking (front office to schedule) Nurse Visit: - 04/15/2023 ******60 minutes***** Other: - take your fluid pills as prescribed. Speak with cardiologist about increasing fluid pills Anesthetic: (In clinic) Topical Lidocaine 5% applied to wound bed Bathing/ Shower/ Hygiene: May shower with protection but do not get wound dressing(s) wet. Protect dressing(s) with water repellant cover (for example, large plastic bag) or a cast cover and may then take shower. Edema Control - Orders / Instructions: Segmental Compressive Device. Use the Segmental Compressive Device on leg(s) 2-3 times a day for 45 - 60 minutes. If wearing any wraps or hose, do not remove them. Continue exercising as instructed. - x3 a day Elevate legs to the level of the heart or above for 30 minutes daily and/or when sitting for 3-4 times a day throughout the day. Avoid standing for long periods of time. Exercise regularly Off-Loading: Other: - When sitting please keep your feet up, keep legs at Heart Level or above. Use pillows behind calf to for comfort. Additional Orders / Instructions: Follow Nutritious Diet - Try and increase protein intake. Goal of protein intake is 60g-100g. Lymphedema Treatment Plan - Exercise, Compression and Elevation: Exercise daily as tolerated. (Walking, ROM, Calf Pumps and T T oe aps) Elevate legs 30 - 60 minutes at or above heart level at least 3 - 4 times daily as able/tolerated Avoid standing for long periods and elevate leg(s) parallel to the floor when sitting Use Pneumatic Compression Device  on leg(s) 2-3 times a day for 45-60 minutes - x3 a day The following medication(s) was prescribed: lidocaine topical 4 % cream cream topical once daily was prescribed at facility WOUND #12: - Lower Leg Wound Laterality: Left, Anterior Cleanser: Soap and Water 1 x Per Week/30 Days Discharge Instructions: May shower and wash wound with dial antibacterial soap and water prior to dressing change. Peri-Wound Care: Sween Lotion (Moisturizing lotion) 1 x Per Week/30 Days Discharge Instructions: Apply moisturizing lotion as directed Prim Dressing: Maxorb Extra CMC/Alginate Dressing, 4x4 (in/in) 1 x Per Week/30 Days ary Discharge Instructions: Apply to wound bed as instructed Secondary Dressing: ABD Pad, 8x10 1 x Per Week/30 Days Discharge Instructions: Apply over primary dressing as directed. Secondary Dressing: Zetuvit Plus 4x8 in 1 x Per Week/30 Days Discharge Instructions: Apply over primary dressing as directed. Secondary Dressing: OptiLock Super Absorbent,  5x5.5 (in/in) (Generic) 1 x Per Week/30 Days Discharge Instructions: Apply directly to wound bed as directed Com pression Wrap: Urgo K2, (equivalent to a 4 layer) two layer compression system, regular 1 x Per Week/30 Days Discharge Instructions: Apply Urgo K2 as directed (alternative to 4 layer compression). WOUND #15: - Lower Leg Wound Laterality: Left, Posterior, Distal Cleanser: Soap and Water 1 x Per Week/30 Days Discharge Instructions: May shower and wash wound with dial antibacterial soap and water prior to dressing change. Peri-Wound Care: Sween Lotion (Moisturizing lotion) 1 x Per Week/30 Days Discharge Instructions: Apply moisturizing lotion as directed Prim Dressing: Maxorb Extra CMC/Alginate Dressing, 4x4 (in/in) 1 x Per Week/30 Days ary Discharge Instructions: Apply to wound bed as instructed Secondary Dressing: ABD Pad, 8x10 1 x Per Week/30 Days Discharge Instructions: Apply over primary dressing as directed. Secondary  Dressing: Zetuvit Plus 4x8 in 1 x Per Week/30 Days Discharge Instructions: Apply over primary dressing as directed. Secondary Dressing: OptiLock Super Absorbent, 5x5.5 (in/in) (Generic) 1 x Per Week/30 Days Discharge Instructions: Apply directly to wound bed as directed Com pression Wrap: Urgo K2, (equivalent to a 4 layer) two layer compression system, regular 1 x Per Week/30 Days Discharge Instructions: Apply Urgo K2 as directed (alternative to 4 layer compression). WOUND #16: - Lower Leg Wound Laterality: Left, Lateral Cleanser: Soap and Water 1 x Per Week/30 Days Discharge Instructions: May shower and wash wound with dial antibacterial soap and water prior to dressing change. Peri-Wound Care: Sween Lotion (Moisturizing lotion) 1 x Per Week/30 Days Discharge Instructions: Apply moisturizing lotion as directed Prim Dressing: Maxorb Extra CMC/Alginate Dressing, 4x4 (in/in) 1 x Per Week/30 Days ary Remmel, [name] [name] ([id number]) [id number] Page 15 of 16 Discharge Instructions: Apply to wound bed as instructed Secondary Dressing: ABD Pad, 8x10 1 x Per Week/30 Days Discharge Instructions: Apply over primary dressing as directed. Secondary Dressing: Zetuvit Plus 4x8 in 1 x Per Week/30 Days Discharge Instructions: Apply over primary dressing as directed. Secondary Dressing: OptiLock Super Absorbent, 5x5.5 (in/in) (Generic) 1 x Per Week/30 Days Discharge Instructions: Apply directly to wound bed as directed Com pression Wrap: Urgo K2, (equivalent to a 4 layer) two layer compression system, regular 1 x Per Week/30 Days Discharge Instructions: Apply Urgo K2 as directed (alternative to 4 layer compression). WOUND #6: - Lower Leg Wound Laterality: Right, Medial Cleanser: Soap and Water 1 x Per Week/30 Days Discharge Instructions: May shower and wash wound with dial antibacterial soap and water prior to dressing change. Peri-Wound Care: Sween Lotion (Moisturizing lotion) 1 x  Per Week/30 Days Discharge Instructions: Apply moisturizing lotion as directed Prim Dressing: Maxorb Extra CMC/Alginate Dressing, 4x4 (in/in) 1 x Per Week/30 Days ary Discharge Instructions: Apply to wound bed as instructed Secondary Dressing: ABD Pad, 8x10 1 x Per Week/30 Days Discharge Instructions: Apply over primary dressing as directed. Secondary Dressing: Zetuvit Plus 4x8 in 1 x Per Week/30 Days Discharge Instructions: Apply over primary dressing as directed. Secondary Dressing: OptiLock Super Absorbent, 5x5.5 (in/in) (Generic) 1 x Per Week/30 Days Discharge Instructions: Apply directly to wound bed as directed Com pression Wrap: Urgo K2, (equivalent to a 4 layer) two layer compression system, regular 1 x Per Week/30 Days Discharge Instructions: Apply Urgo K2 as directed (alternative to 4 layer compression). WOUND #7: - Lower Leg Wound Laterality: Left, Medial Cleanser: Soap and Water 1 x Per Week/30 Days Discharge Instructions: May shower and wash wound with dial antibacterial soap and water prior to dressing change. Peri-Wound Care: Sween Lotion (Moisturizing lotion) 1  x Per Week/30 Days Discharge Instructions: Apply moisturizing lotion as directed Prim Dressing: Maxorb Extra CMC/Alginate Dressing, 4x4 (in/in) 1 x Per Week/30 Days ary Discharge Instructions: Apply to wound bed as instructed Secondary Dressing: ABD Pad, 8x10 1 x Per Week/30 Days Discharge Instructions: Apply over primary dressing as directed. Secondary Dressing: Zetuvit Plus 4x8 in 1 x Per Week/30 Days Discharge Instructions: Apply over primary dressing as directed. Secondary Dressing: OptiLock Super Absorbent, 5x5.5 (in/in) (Generic) 1 x Per Week/30 Days Discharge Instructions: Apply directly to wound bed as directed Com pression Wrap: Urgo K2, (equivalent to a 4 layer) two layer compression system, regular 1 x Per Week/30 Days Discharge Instructions: Apply Urgo K2 as directed (alternative to 4 layer  compression). 1. As the amount of edema in his legs had reduced this week I went ahead with a debridement with an open curette removing slough and subcutaneous tissue. The wound beds clean up fairly well but there is still weeping edema fluid 2. Predominant areas on the right medial left medial and left posterior calf 3. His physical exam shows an elevated jugular venous pressure. Pansystolic murmur that augments with inspiration I suspect he has some degree of right ventricular failure. Will need to have a look at his records. His cardiologist is Dr. Algie Coffer. I have asked the patient to make an appointment Electronic Signature(s) Signed: 04/08/2023 4:37:54 PM By: [name] Najjar MD Entered By: [name] [name] on 04/08/2023 16:32:52 -------------------------------------------------------------------------------- SuperBill Details Patient Name: Date of Service: Banton, [name] [name] 04/08/2023 Medical Record Number: [id number] Patient Account Number: [id number] Date of Birth/Sex: Treating RN: 17-Sep-1945 (77 y.o. 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 Facility Procedures : [facility] CPT4 CodeYosbel, Bitting ([id number] 11 I Description: ) [phone] 042 - DEB SUBQ TISSUE 20 SQ CM/< CD-10 Diagnosis Description L97.828 Non-pressure chronic ulcer of other part of left lower leg with other specified sev L97.818 Non-pressure chronic ulcer of other part of right lower leg  with other  specified se Modifier: [id number] erity verity Quantity: 227.pdf Page 16 of 16 1 : CPT4 Code: [id number] 11 I Description: 045 - DEB SUBQ TISS EA ADDL 20CM CD-10 Diagnosis Description L97.828 Non-pressure chronic ulcer of other part of left lower leg with other specified sev L97.818 Non-pressure chronic ulcer of other part of right lower leg with other specified  se Modifier: erity verity Quantity: 1 Physician Procedures : CPT4 Code Description Modifier [id number] 11042 - WC PHYS SUBQ TISS 20 SQ CM ICD-10 Diagnosis Description 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 Quantity: 1 : [id number] 11045 - WC PHYS SUBQ TISS EA ADDL 20 CM ICD-10 Diagnosis Description 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.818 Non-pressure chronic ulcer of other part of right lower leg with other  specified severity Quantity: 1 Electronic Signature(s) Signed: 04/08/2023 4:37:54 PM By: [name] Najjar MD Entered By: [name] [name] on 04/08/2023 16:33:14

## 2023-04-10 NOTE — Progress Notes (Signed)
[name] ([id number]) [id number] Page 1 of 16 Visit Report for 04/08/2023 Arrival Information Details Patient Name: Date of Service: [name] 04/08/2023 2:45 PM Medical Record Number: [id number] Patient Account Number: [id number] Date of Birth/Sex: Treating RN: Jul 09, 1945 (77 y.o. M) Primary Care Ersel Wadleigh: [name] Other Clinician: Referring Porcia Morganti: Treating Jaquis Picklesimer/Extender: Stark Klein in Treatment: 72 Visit Information History Since Last Visit Added or deleted any medications: No Patient Arrived: Cane Any new allergies or adverse reactions: No Arrival Time: 15:25 Had a fall or experienced change in No Accompanied By: wife activities of daily living that may affect Transfer Assistance: None risk of falls: Patient Identification Verified: Yes Signs or symptoms of abuse/neglect since last visito No Secondary Verification Process Completed: Yes Hospitalized since last visit: No Patient Requires Transmission-Based Precautions: No Implantable device outside of the clinic excluding No Patient Has Alerts: Yes cellular tissue based products placed in the center Patient Alerts: Patient on Blood Thinner since last visit: Right ABI in clinic [place] Has Dressing in Place as Prescribed: Yes Has Compression in Place as Prescribed: Yes Pain Present Now: No Electronic Signature(s) Signed: 04/10/2023 11:01:27 AM By: Thayer Dallas Entered By: Thayer Dallas on 04/08/2023 12:49:17 -------------------------------------------------------------------------------- Compression Therapy Details Patient Name: Date of Service: [name], [name] 04/08/2023 2:45 PM Medical Record Number: [id number] Patient Account Number: [id number] Date of Birth/Sex: Treating RN: [birth year] (77 y.o. Cline Cools Primary Care Kaysia Willard: [name] Other Clinician: Referring Jazion Atteberry: Treating Lillis Nuttle/Extender: Valentina Gu Weeks in Treatment: 26 Compression Therapy Performed for Wound Assessment: Wound #12 Left,Anterior Lower Leg Performed By: Clinician Redmond Pulling, RN Compression Type: Four Layer Post Procedure Diagnosis Same as Pre-procedure Electronic Signature(s) Signed: 04/08/2023 5:07:51 PM By: Redmond Pulling RN, [name] Entered By: Redmond Pulling on 04/08/2023 13:24:11 [name], Dannielle [name] ([id number]) [id number] Page 2 of 16 -------------------------------------------------------------------------------- Compression Therapy Details Patient Name: Date of Service: [name] 04/08/2023 2:45 PM Medical Record Number: [id number] Patient Account Number: [id number] Date of Birth/Sex: Treating RN: 1945/11/29 (77 y.o. Cline Cools Primary Care Jerod Mcquain: [name] Other Clinician: Referring Dewey Neukam: Treating Sabine Tenenbaum/Extender: Valentina Gu Weeks in Treatment: 14 Compression Therapy Performed for Wound Assessment: Wound #15 Left,Distal,Posterior Lower Leg Performed By: Clinician Redmond Pulling, RN Compression Type: Four Layer Post Procedure Diagnosis Same as Pre-procedure Electronic Signature(s) Signed: 04/08/2023 5:07:51 PM By: Redmond Pulling RN, [name] Entered By: Redmond Pulling on 04/08/2023 13:24:11 -------------------------------------------------------------------------------- Compression Therapy Details Patient Name: Date of Service: [name] 04/08/2023 2:45 PM Medical Record Number: [id number] Patient Account Number: [id number] Date of Birth/Sex: Treating RN: 1946/03/31 (76 y.o. Cline Cools Primary Care Harnoor Kohles: [name] Other Clinician: Referring Avari Nevares: Treating Daisy Mcneel/Extender: Valentina Gu Weeks in Treatment: 66 Compression Therapy Performed for Wound Assessment: Wound #16 Left,Lateral Lower Leg Performed By: Clinician Redmond Pulling, RN Compression Type: Four  Layer Post Procedure Diagnosis Same as Pre-procedure Electronic Signature(s) Signed: 04/08/2023 5:07:51 PM By: Redmond Pulling RN, [name] Entered By: Redmond Pulling on 04/08/2023 13:24:11 -------------------------------------------------------------------------------- Compression Therapy Details Patient Name: Date of Service: [name] 04/08/2023 2:45 PM Medical Record Number: [id number] Patient Account Number: [id number] Date of Birth/Sex: Treating RN: 1945-06-04 (76 y.o. Cline Cools Primary Care Aino Heckert: [name] Other Clinician: Referring Ginger Leeth: Treating Manreet Kiernan/Extender: Valentina Gu Weeks in Treatment: 52 Compression Therapy Performed for Wound Assessment: Wound #6 Right,Medial Lower Leg Performed By: Clinician Redmond Pulling, RN Compression Type: Four Layer Post Procedure Diagnosis Same as  Pre-procedure Electronic Signature(s) Signed: 04/08/2023 5:07:51 PM By: Redmond Pulling RN, [name] [name], Dannielle [name] ([id number]) PM By: Redmond Pulling RN, [name] [phone].pdf Page 3 of 16 Signed: 04/08/2023 5:07:51 Entered By: Redmond Pulling on 04/08/2023 13:24:11 -------------------------------------------------------------------------------- Compression Therapy Details Patient Name: Date of Service: [name] 04/08/2023 2:45 PM Medical Record Number: [id number] Patient Account Number: [id number] Date of Birth/Sex: Treating RN: 12/30/1945 (77 y.o. Cline Cools Primary Care Esbeidy Mclaine: [name] Other Clinician: Referring Malaina Mortellaro: Treating Maxx Pham/Extender: Valentina Gu Weeks in Treatment: 50 Compression Therapy Performed for Wound Assessment: Wound #7 Left,Medial Lower Leg Performed By: Clinician Redmond Pulling, RN Compression Type: Four Layer Post Procedure Diagnosis Same as Pre-procedure Electronic Signature(s) Signed: 04/08/2023 5:07:51 PM By: Redmond Pulling RN, [name] Entered By: Redmond Pulling on 04/08/2023 13:24:11 -------------------------------------------------------------------------------- Encounter Discharge Information Details Patient Name: Date of Service: [name] 04/08/2023 2:45 PM Medical Record Number: [id number] Patient Account Number: [id number] Date of Birth/Sex: Treating RN: [birth year] (77 y.o. Cline Cools Primary Care Monterius Rolf: [name] Other Clinician: Referring Consuelo Thayne: Treating Leovardo Thoman/Extender: Valentina Gu Weeks in Treatment: 57 Encounter Discharge Information Items Post Procedure Vitals Discharge Condition: Stable Temperature (F): 97.8 Ambulatory Status: Cane Pulse (bpm): 82 Discharge Destination: Home Respiratory Rate (breaths/min): 18 Transportation: Private Auto Blood Pressure (mmHg): 176/70 Accompanied By: wife Schedule Follow-up Appointment: Yes Clinical Summary of Care: Patient Declined Electronic Signature(s) Signed: 04/08/2023 5:07:51 PM By: Redmond Pulling RN, [name] Entered By: Redmond Pulling on 04/08/2023 13:55:23 -------------------------------------------------------------------------------- Lower Extremity Assessment Details Patient Name: Date of Service: [name] 04/08/2023 2:45 PM Medical Record Number: [id number] Patient Account Number: [id number] Date of Birth/Sex: Treating RN: 1945-09-11 (77 y.o. M) Primary Care Blake Vetrano: [name] Other Clinician: Rena, Dannielle [name] ([id number]) [id number] Page 4 of 16 Referring Lattie Riege: Treating Carmello Cabiness/Extender: Dominga Ferry, [name] Weeks in Treatment: 54 Edema Assessment Assessed: [Left: No] [Right: No] Edema: [Left: Yes] [Right: Yes] Calf Left: Right: Point of Measurement: 40 cm From Medial Instep 58 cm 65 cm Ankle Left: Right: Point of Measurement: 13 cm From Medial Instep 40.5 cm 39.5 cm Vascular Assessment Extremity colors, hair growth, and conditions: Extremity Color:  [Left:Hyperpigmented] [Right:Hyperpigmented] Hair Growth on Extremity: [Left:No] Temperature of Extremity: [Left:Warm] [Right:Warm] Capillary Refill: [Left:> 3 seconds] [Right:> 3 seconds] Dependent Rubor: [Left:No Yes] [Right:No Yes] Electronic Signature(s) Signed: 04/10/2023 11:01:27 AM By: Thayer Dallas Entered By: Thayer Dallas on 04/08/2023 12:57:02 -------------------------------------------------------------------------------- Multi Wound Chart Details Patient Name: Date of Service: [name] 04/08/2023 2:45 PM Medical Record Number: [id number] Patient Account Number: [id number] Date of Birth/Sex: Treating RN: 1945-10-12 (77 y.o. M) Primary Care Jerimah Witucki: [name] Other Clinician: Referring Shontell Prosser: Treating Naithan Delage/Extender: Valentina Gu Weeks in Treatment: 82 Vital Signs Height(in): 74 Pulse(bpm): 82 Weight(lbs): 250 Blood Pressure(mmHg): 176/70 Body Mass Index(BMI): 32.1 Temperature(F): 97.8 Respiratory Rate(breaths/min): 18 [12:Photos:] Left, Anterior Lower Leg Left, Distal, Posterior Lower Leg Left, Lateral Lower Leg Wound Location: Frostbite Gradually Appeared Blister Wounding Event: Lymphedema Lymphedema Venous Leg Ulcer Primary Etiology: Anemia, Lymphedema, Deep Vein Anemia, Lymphedema, Deep Vein Anemia, Lymphedema, Deep Vein Comorbid History: Thrombosis, Hypertension, Received Thrombosis, Hypertension, Received Thrombosis, Hypertension, Received Radiation Radiation Radiation 12/17/2022 01/14/2023 04/01/2023 Date Acquired: 16 7 1  Weeks of Treatment: Lavine, Dannielle [name] ([id number]) [id number] Page 5 of 16 Open Open Open Wound Status: No No No Wound Recurrence: No No No Clustered Wound: N/A N/A N/A Clustered Quantity: 3.5x2x0.1 3.5x5x0.1 2x2.5x0.1 Measurements L x W x D (cm) 5.498 13.744 3.927 A (cm) : rea 0.55  1.374 0.393 Volume (cm) : -55.60% -55.50% 28.80% % Reduction in Area: 22.20%  -55.40% 28.70% % Reduction in Volume: Full Thickness Without Exposed Full Thickness Without Exposed Full Thickness Without Exposed Classification: Support Structures Support Structures Support Structures Large Medium Medium Exudate A mount: Serous Serosanguineous Serous Exudate Type: amber red, brown amber Exudate Color: Distinct, outline attached Distinct, outline attached Distinct, outline attached Wound Margin: Medium (34-66%) Small (1-33%) Large (67-100%) Granulation A mount: Red, Pink Pink Red Granulation Quality: Medium (34-66%) Large (67-100%) None Present (0%) Necrotic A mount: Adherent Slough Adherent Slough N/A Necrotic Tissue: Fat Layer (Subcutaneous Tissue): Yes Fat Layer (Subcutaneous Tissue): Yes Fascia: No Exposed Structures: Fascia: No Fascia: No Fat Layer (Subcutaneous Tissue): No Tendon: No Tendon: No Tendon: No Muscle: No Muscle: No Muscle: No Joint: No Joint: No Joint: No Bone: No Bone: No Bone: No Small (1-33%) Small (1-33%) Small (1-33%) Epithelialization: Debridement - Excisional N/A N/A Debridement: Pre-procedure Verification/Time Out 16:20 N/A N/A Taken: Lidocaine 4% Topical Solution N/A N/A Pain Control: Subcutaneous, Slough N/A N/A Tissue Debrided: Skin/Subcutaneous Tissue N/A N/A Level: 4.4 N/A N/A Debridement A (sq cm): rea Curette N/A N/A Instrument: Minimum N/A N/A Bleeding: Pressure N/A N/A Hemostasis A chieved: Procedure was tolerated well N/A N/A Debridement Treatment Response: 3.5x2x0.1 N/A N/A Post Debridement Measurements L x W x D (cm) 0.55 N/A N/A Post Debridement Volume: (cm) Excoriation: No Excoriation: No Excoriation: No Periwound Skin Texture: Induration: No Induration: No Induration: No Callus: No Callus: No Callus: No Crepitus: No Crepitus: No Crepitus: No Rash: No Rash: No Rash: No Scarring: No Scarring: No Scarring: No Maceration: No Maceration: No Maceration: No Periwound Skin  Moisture: Dry/Scaly: No Dry/Scaly: No Dry/Scaly: No Hemosiderin Staining: Yes Hemosiderin Staining: Yes Atrophie Blanche: No Periwound Skin Color: Atrophie Blanche: No Atrophie Blanche: No Cyanosis: No Cyanosis: No Cyanosis: No Ecchymosis: No Ecchymosis: No Ecchymosis: No Erythema: No Erythema: No Erythema: No Hemosiderin Staining: No Mottled: No Mottled: No Mottled: No Pallor: No Pallor: No Pallor: No Rubor: No Rubor: No Rubor: No No Abnormality N/A N/A Temperature: Compression Therapy Compression Therapy Compression Therapy Procedures Performed: Debridement Wound Number: 6 7 N/A Photos: N/A Right, Medial Lower Leg Left, Medial Lower Leg N/A Wound Location: Gradually Appeared Gradually Appeared N/A Wounding Event: Lymphedema Lymphedema N/A Primary Etiology: Anemia, Lymphedema, Deep Vein Anemia, Lymphedema, Deep Vein N/A Comorbid History: Thrombosis, Hypertension, Received Thrombosis, Hypertension, Received Radiation Radiation 03/05/2022 03/05/2022 N/A Date Acquired: 41 54 N/A Weeks of Treatment: Open Open N/A Wound Status: No No N/A Wound Recurrence: Yes Yes N/A Clustered Wound: 1 1 N/A Clustered Quantity: 8x9x0.3 5x4.5x0.1 N/A Measurements L x W x D (cm) 56.549 17.671 N/A A (cm) : rea 16.965 1.767 N/A Volume (cm) : Oliger, Dannielle [name] ([id number]) [id number] Page 6 of 16 60.40% 62.50% N/A % Reduction in Area: -18.70% 62.50% N/A % Reduction in Volume: Full Thickness Without Exposed Full Thickness Without Exposed N/A Classification: Support Structures Support Structures Large Large N/A Exudate A mount: Serous Serous N/A Exudate Type: Media planner N/A Exudate Color: Distinct, outline attached Distinct, outline attached N/A Wound Margin: Small (1-33%) Small (1-33%) N/A Granulation A mount: Pink Red N/A Granulation Quality: Large (67-100%) Large (67-100%) N/A Necrotic A mount: Adherent Slough Eschar N/A Necrotic  Tissue: Fat Layer (Subcutaneous Tissue): Yes Fat Layer (Subcutaneous Tissue): Yes N/A Exposed Structures: Fascia: No Fascia: No Tendon: No Tendon: No Muscle: No Muscle: No Joint: No Joint: No Bone: No Bone: No Small (1-33%) Small (1-33%) N/A Epithelialization: Debridement - Excisional N/A N/A Debridement: Pre-procedure Verification/Time  Out 16:20 N/A N/A Taken: Lidocaine 4% Topical Solution N/A N/A Pain Control: Subcutaneous, Slough N/A N/A Tissue Debrided: Skin/Subcutaneous Tissue N/A N/A Level: 28.26 N/A N/A Debridement A (sq cm): rea Curette N/A N/A Instrument: Minimum N/A N/A Bleeding: Pressure N/A N/A Hemostasis A chieved: Procedure was tolerated well N/A N/A Debridement Treatment Response: 8x9x0.3 N/A N/A Post Debridement Measurements L x W x D (cm) 16.965 N/A N/A Post Debridement Volume: (cm) Scarring: Yes Scarring: Yes N/A Periwound Skin Texture: Excoriation: No Excoriation: No Induration: No Induration: No Callus: No Callus: No Crepitus: No Crepitus: No Rash: No Rash: No Maceration: No Maceration: No N/A Periwound Skin Moisture: Dry/Scaly: No Dry/Scaly: No Hemosiderin Staining: Yes Hemosiderin Staining: Yes N/A Periwound Skin Color: Atrophie Blanche: No Atrophie Blanche: No Cyanosis: No Cyanosis: No Ecchymosis: No Ecchymosis: No Erythema: No Erythema: No Mottled: No Mottled: No Pallor: No Pallor: No Rubor: No Rubor: No No Abnormality No Abnormality N/A Temperature: Compression Therapy Compression Therapy N/A Procedures Performed: Debridement Treatment Notes Electronic Signature(s) Signed: 04/08/2023 4:37:54 PM By: Baltazar Najjar MD Entered By: Baltazar Najjar on 04/08/2023 13:28:09 -------------------------------------------------------------------------------- Multi-Disciplinary Care Plan Details Patient Name: Date of Service: [name] 04/08/2023 2:45 PM Medical Record Number: [id number] Patient Account  Number: [id number] Date of Birth/Sex: Treating RN: 09/05/1945 (77 y.o. M) Primary Care Xochilth [name]: [name] Other Clinician: Referring Sharlize Hoar: Treating Sharad Vaneaton/Extender: Valentina Gu Weeks in Treatment: 43 Multidisciplinary Care Plan reviewed with physician Active Inactive Bansal, Dannielle [name] ([id number]) [id number] Page 7 of 16 Wound/Skin Impairment Nursing Diagnoses: Impaired tissue integrity Knowledge deficit related to ulceration/compromised skin integrity Goals: Patient will have a decrease in wound volume by X% from date: (specify in notes) Date Initiated: 03/26/2022 Target Resolution Date: 04/17/2025 Goal Status: Active Patient/caregiver will verbalize understanding of skin care regimen Date Initiated: 03/26/2022 Date Inactivated: 03/25/2023 Target Resolution Date: 04/17/2025 Unmet Reason: patient missed last Goal Status: Unmet week. Patient Stop taking his diuertic. Ulcer/skin breakdown will have a volume reduction of 30% by week 4 Date Initiated: 03/26/2022 Date Inactivated: 05/21/2022 Target Resolution Date: 05/17/2022 Unmet Reason: see wound Goal Status: Unmet measurement. Ulcer/skin breakdown will have a volume reduction of 50% by week 8 Date Initiated: 03/26/2022 Date Inactivated: 05/21/2022 Target Resolution Date: 05/17/2022 Unmet Reason: see wound Goal Status: Unmet measurement. Interventions: Assess patient/caregiver ability to obtain necessary supplies Assess patient/caregiver ability to perform ulcer/skin care regimen upon admission and as needed Assess ulceration(s) every visit Notes: Patient stated today, "I will take my fluid pill or pump not do both." Kirk Basquez made aware. Electronic Signature(s) Signed: 04/10/2023 11:01:27 AM By: Thayer Dallas Entered By: Thayer Dallas on 04/08/2023 13:16:47 -------------------------------------------------------------------------------- Pain Assessment Details Patient  Name: Date of Service: [name] 04/08/2023 2:45 PM Medical Record Number: [id number] Patient Account Number: [id number] Date of Birth/Sex: Treating RN: Jul 06, 1945 (77 y.o. M) Primary Care Jonerik Sliker: [name] Other Clinician: Referring Cleston Lautner: Treating November Sypher/Extender: Valentina Gu Weeks in Treatment: 64 Active Problems Location of Pain Severity and Description of Pain Patient Has Paino Yes Site Locations Rate the pain. Current Pain Level: 8 Pain Management and Medication Boodram, Dannielle [name] ([id number]) [id number] Page 8 of 16 Current Pain Management: Electronic Signature(s) Signed: 04/10/2023 11:01:27 AM By: Thayer Dallas Entered By: Thayer Dallas on 04/08/2023 12:56:22 -------------------------------------------------------------------------------- Patient/Caregiver Education Details Patient Name: Date of Service: Avis, [name] 11/20/2024andnbsp2:45 PM Medical Record Number: [id number] Patient Account Number: [id number] Date of Birth/Gender: Treating RN: Oct 15, 1945 (77 y.o. M) Primary Care Physician: [name] Other Clinician: Referring Physician: Treating Physician/Extender:  Valentina Gu Weeks in Treatment: 53 Education Assessment Education Provided To: Patient Education Topics Provided Wound/Skin Impairment: Methods: Explain/Verbal Responses: State content correctly [employer]) Signed: 04/10/2023 11:01:27 AM By: Thayer Dallas Entered By: Thayer Dallas on 04/08/2023 13:17:20 -------------------------------------------------------------------------------- Wound Assessment Details Patient Name: Date of Service: [name] 04/08/2023 2:45 PM Medical Record Number: [id number] Patient Account Number: [id number] Date of Birth/Sex: Treating RN: 09-17-1945 (77 y.o. M) Primary Care Tadeusz Stahl: [name] Other Clinician: Referring Briane Birden: Treating  Tolulope Pinkett/Extender: Valentina Gu Weeks in Treatment: 39 Wound Status Wound Number: 12 Primary Lymphedema Etiology: Wound Location: Left, Anterior Lower Leg Wound Open Wounding Event: Frostbite Status: Date Acquired: 12/17/2022 Comorbid Anemia, Lymphedema, Deep Vein Thrombosis, Hypertension, Weeks Of Treatment: 16 History: Received Radiation Clustered Wound: No Photos [place], Dannielle [name] ([id number]) [id number] Page 9 of 16 Wound Measurements Length: (cm) 3.5 Width: (cm) 2 Depth: (cm) 0.1 Area: (cm) 5.498 Volume: (cm) 0.55 % Reduction in Area: -55.6% % Reduction in Volume: 22.2% Epithelialization: Small (1-33%) Tunneling: No Undermining: No Wound Description Classification: Full Thickness Without Exposed Support Structures Wound Margin: Distinct, outline attached Exudate Amount: Large Exudate Type: Serous Exudate Color: amber Foul Odor After Cleansing: No Slough/Fibrino Yes Wound Bed Granulation Amount: Medium (34-66%) Exposed Structure Granulation Quality: Red, Pink Fascia Exposed: No Necrotic Amount: Medium (34-66%) Fat Layer (Subcutaneous Tissue) Exposed: Yes Necrotic Quality: Adherent Slough Tendon Exposed: No Muscle Exposed: No Joint Exposed: No Bone Exposed: No Periwound Skin Texture Texture Color No Abnormalities Noted: No No Abnormalities Noted: No Callus: No Atrophie Blanche: No Crepitus: No Cyanosis: No Excoriation: No Ecchymosis: No Induration: No Erythema: No Rash: No Hemosiderin Staining: Yes Scarring: No Mottled: No Pallor: No Moisture Rubor: No No Abnormalities Noted: No Dry / Scaly: No Temperature / Pain Maceration: No Temperature: No Abnormality Treatment Notes Wound #12 (Lower Leg) Wound Laterality: Left, Anterior Cleanser Soap and Water Discharge Instruction: May shower and wash wound with dial antibacterial soap and water prior to dressing change. Peri-Wound Care Sween Lotion  (Moisturizing lotion) Discharge Instruction: Apply moisturizing lotion as directed Topical Primary Dressing Maxorb Extra CMC/Alginate Dressing, 4x4 (in/in) Discharge Instruction: Apply to wound bed as instructed Secondary Dressing ABD Pad, 8x10 Discharge Instruction: Apply over primary dressing as directed. Zetuvit Plus 4x8 in Discharge Instruction: Apply over primary dressing as directed. Rosenzweig, Dannielle [name] ([id number]) [id number] Page 10 of 16 OptiLock Super Absorbent, 5x5.5 (in/in) Discharge Instruction: Apply directly to wound bed as directed Secured With Compression Wrap Urgo K2, (equivalent to a 4 layer) two layer compression system, regular Discharge Instruction: Apply Urgo K2 as directed (alternative to 4 layer compression). Compression Stockings Add-Ons Electronic Signature(s) Signed: 04/10/2023 11:01:27 AM By: Thayer Dallas Entered By: Thayer Dallas on 04/08/2023 13:00:17 -------------------------------------------------------------------------------- Wound Assessment Details Patient Name: Date of Service: [name] 04/08/2023 2:45 PM Medical Record Number: [id number] Patient Account Number: [id number] Date of Birth/Sex: Treating RN: [birth year] (77 y.o. M) Primary Care Roise Emert: [name] Other Clinician: Referring Jonie Burdell: Treating Chelsea Pedretti/Extender: Valentina Gu Weeks in Treatment: 54 Wound Status Wound Number: 15 Primary Lymphedema Etiology: Wound Location: Left, Distal, Posterior Lower Leg Wound Open Wounding Event: Gradually Appeared Status: Date Acquired: 01/14/2023 Comorbid Anemia, Lymphedema, Deep Vein Thrombosis, Hypertension, Weeks Of Treatment: 7 History: Received Radiation Clustered Wound: No Photos Wound Measurements Length: (cm) 3.5 Width: (cm) 5 Depth: (cm) 0.1 Area: (cm) 13.744 Volume: (cm) 1.374 % Reduction in Area: -55.5% % Reduction in Volume: -55.4% Epithelialization: Small  (1-33%) Tunneling: No Undermining: No Wound Description Classification:  Full Thickness Without Exposed Suppor Wound Margin: Distinct, outline attached Exudate Amount: Medium Exudate Type: Serosanguineous Exudate Color: red, brown t Structures Foul Odor After Cleansing: No Slough/Fibrino Yes Wound Bed Granulation Amount: Small (1-33%) Exposed Structure Granulation Quality: Pink Fascia Exposed: No Necrotic Amount: Large (67-100%) Fat Layer (Subcutaneous Tissue) Exposed: Yes Radman, Jadyn ([id number]) [id number] Page 11 of 16 Necrotic Quality: Adherent Slough Tendon Exposed: No Muscle Exposed: No Joint Exposed: No Bone Exposed: No Periwound Skin Texture Texture Color No Abnormalities Noted: No No Abnormalities Noted: No Callus: No Atrophie Blanche: No Crepitus: No Cyanosis: No Excoriation: No Ecchymosis: No Induration: No Erythema: No Rash: No Hemosiderin Staining: Yes Scarring: No Mottled: No Pallor: No Moisture Rubor: No No Abnormalities Noted: No Dry / Scaly: No Maceration: No Treatment Notes Wound #15 (Lower Leg) Wound Laterality: Left, Posterior, Distal Cleanser Soap and Water Discharge Instruction: May shower and wash wound with dial antibacterial soap and water prior to dressing change. Peri-Wound Care Sween Lotion (Moisturizing lotion) Discharge Instruction: Apply moisturizing lotion as directed Topical Primary Dressing Maxorb Extra CMC/Alginate Dressing, 4x4 (in/in) Discharge Instruction: Apply to wound bed as instructed Secondary Dressing ABD Pad, 8x10 Discharge Instruction: Apply over primary dressing as directed. Zetuvit Plus 4x8 in Discharge Instruction: Apply over primary dressing as directed. OptiLock Super Absorbent, 5x5.5 (in/in) Discharge Instruction: Apply directly to wound bed as directed Secured With Compression Wrap Urgo K2, (equivalent to a 4 layer) two layer compression system, regular Discharge  Instruction: Apply Urgo K2 as directed (alternative to 4 layer compression). Compression Stockings Add-Ons Electronic Signature(s) Signed: 04/10/2023 11:01:27 AM By: Thayer Dallas Entered By: Thayer Dallas on 04/08/2023 13:01:38 -------------------------------------------------------------------------------- Wound Assessment Details Patient Name: Date of Service: [name] 04/08/2023 2:45 PM Medical Record Number: [id number] Patient Account Number: [id number] Date of Birth/Sex: Treating RN: 11-Nov-1945 (77 y.o. M) Primary Care Margaretha Mahan: [name] Other Clinician: Referring Shterna Laramee: Treating [name]/Extender: Stark Klein in Treatment: 72 Estrin, Dannielle [name] ([id number]) [id number] Page 12 of 16 Wound Status Wound Number: 16 Primary Venous Leg Ulcer Etiology: Wound Location: Left, Lateral Lower Leg Wound Open Wounding Event: Blister Status: Date Acquired: 04/01/2023 Comorbid Anemia, Lymphedema, Deep Vein Thrombosis, Hypertension, Weeks Of Treatment: 1 History: Received Radiation Clustered Wound: No Photos Wound Measurements Length: (cm) 2 Width: (cm) 2.5 Depth: (cm) 0.1 Area: (cm) 3.927 Volume: (cm) 0.393 % Reduction in Area: 28.8% % Reduction in Volume: 28.7% Epithelialization: Small (1-33%) Tunneling: No Undermining: No Wound Description Classification: Full Thickness Without Exposed Suppor Wound Margin: Distinct, outline attached Exudate Amount: Medium Exudate Type: Serous Exudate Color: amber t Structures Foul Odor After Cleansing: No Slough/Fibrino No Wound Bed Granulation Amount: Large (67-100%) Exposed Structure Granulation Quality: Red Fascia Exposed: No Necrotic Amount: None Present (0%) Fat Layer (Subcutaneous Tissue) Exposed: No Tendon Exposed: No Muscle Exposed: No Joint Exposed: No Bone Exposed: No Periwound Skin Texture Texture Color No Abnormalities Noted: No No Abnormalities  Noted: No Callus: No Atrophie Blanche: No Crepitus: No Cyanosis: No Excoriation: No Ecchymosis: No Induration: No Erythema: No Rash: No Hemosiderin Staining: No Scarring: No Mottled: No Pallor: No Moisture Rubor: No No Abnormalities Noted: No Dry / Scaly: No Maceration: No Treatment Notes Wound #16 (Lower Leg) Wound Laterality: Left, Lateral Cleanser Soap and Water Discharge Instruction: May shower and wash wound with dial antibacterial soap and water prior to dressing change. Peri-Wound Care Sween Lotion (Moisturizing lotion) Discharge Instruction: Apply moisturizing lotion as directed Topical Nastasi, Biruk ([id number]) [id number] Page 13 of 16 Primary Dressing Maxorb Extra CMC/Alginate Dressing,  4x4 (in/in) Discharge Instruction: Apply to wound bed as instructed Secondary Dressing ABD Pad, 8x10 Discharge Instruction: Apply over primary dressing as directed. Zetuvit Plus 4x8 in Discharge Instruction: Apply over primary dressing as directed. OptiLock Super Absorbent, 5x5.5 (in/in) Discharge Instruction: Apply directly to wound bed as directed Secured With Compression Wrap Urgo K2, (equivalent to a 4 layer) two layer compression system, regular Discharge Instruction: Apply Urgo K2 as directed (alternative to 4 layer compression). Compression Stockings Add-Ons Electronic Signature(s) Signed: 04/10/2023 11:01:27 AM By: Thayer Dallas Entered By: Thayer Dallas on 04/08/2023 13:02:20 -------------------------------------------------------------------------------- Wound Assessment Details Patient Name: Date of Service: [name] 04/08/2023 2:45 PM Medical Record Number: [id number] Patient Account Number: [id number] Date of Birth/Sex: Treating RN: 10/18/1945 (77 y.o. M) Primary Care Ammiel Guiney: [name] Other Clinician: Referring Delphine Sizemore: Treating Johnye Kist/Extender: Valentina Gu Weeks in Treatment:  54 Wound Status Wound Number: 6 Primary Lymphedema Etiology: Wound Location: Right, Medial Lower Leg Wound Open Wounding Event: Gradually Appeared Status: Date Acquired: 03/05/2022 Comorbid Anemia, Lymphedema, Deep Vein Thrombosis, Hypertension, Weeks Of Treatment: 54 History: Received Radiation Clustered Wound: Yes Photos Wound Measurements Length: (cm) Width: (cm) Depth: (cm) Clustered Quantity: Area: (cm) Volume: (cm) 8 % Reduction in Area: 60.4% 9 % Reduction in Volume: -18.7% 0.3 Epithelialization: Small (1-33%) 1 Tunneling: No 56.549 Undermining: No 16.965 Wound Description Reagor, Ishmeal ([id number]) Classification: Full Thickness Without Exposed Support Structures Wound Margin: Distinct, outline attached Exudate Amount: Large Exudate Type: Serous Exudate Color: amber [id number] Page 14 of 16 Foul Odor After Cleansing: No Slough/Fibrino Yes Wound Bed Granulation Amount: Small (1-33%) Exposed Structure Granulation Quality: Pink Fascia Exposed: No Necrotic Amount: Large (67-100%) Fat Layer (Subcutaneous Tissue) Exposed: Yes Necrotic Quality: Adherent Slough Tendon Exposed: No Muscle Exposed: No Joint Exposed: No Bone Exposed: No Periwound Skin Texture Texture Color No Abnormalities Noted: No No Abnormalities Noted: No Callus: No Atrophie Blanche: No Crepitus: No Cyanosis: No Excoriation: No Ecchymosis: No Induration: No Erythema: No Rash: No Hemosiderin Staining: Yes Scarring: Yes Mottled: No Pallor: No Moisture Rubor: No No Abnormalities Noted: No Dry / Scaly: No Temperature / Pain Maceration: No Temperature: No Abnormality Treatment Notes Wound #6 (Lower Leg) Wound Laterality: Right, Medial Cleanser Soap and Water Discharge Instruction: May shower and wash wound with dial antibacterial soap and water prior to dressing change. Peri-Wound Care Sween Lotion (Moisturizing lotion) Discharge Instruction: Apply  moisturizing lotion as directed Topical Primary Dressing Maxorb Extra CMC/Alginate Dressing, 4x4 (in/in) Discharge Instruction: Apply to wound bed as instructed Secondary Dressing ABD Pad, 8x10 Discharge Instruction: Apply over primary dressing as directed. Zetuvit Plus 4x8 in Discharge Instruction: Apply over primary dressing as directed. OptiLock Super Absorbent, 5x5.5 (in/in) Discharge Instruction: Apply directly to wound bed as directed Secured With Compression Wrap Urgo K2, (equivalent to a 4 layer) two layer compression system, regular Discharge Instruction: Apply Urgo K2 as directed (alternative to 4 layer compression). Compression Stockings Add-Ons Electronic Signature(s) Signed: 04/10/2023 11:01:27 AM By: Thayer Dallas Entered By: Thayer Dallas on 04/08/2023 12:58:46 Deem, Dannielle [name] ([id number]) [id number] Page 15 of 16 -------------------------------------------------------------------------------- Wound Assessment Details Patient Name: Date of Service: [name] 04/08/2023 2:45 PM Medical Record Number: [id number] Patient Account Number: [id number] Date of Birth/Sex: Treating RN: 1945-05-25 (77 y.o. M) Primary Care Sylvestre Rathgeber: [name] Other Clinician: Referring Sarkis Rhines: Treating Chelsy Parrales/Extender: Valentina Gu Weeks in Treatment: 74 Wound Status Wound Number: 7 Primary Lymphedema Etiology: Wound Location: Left, Medial Lower Leg Wound Open Wounding Event: Gradually Appeared Status: Date Acquired: 03/05/2022  Comorbid Anemia, Lymphedema, Deep Vein Thrombosis, Hypertension, Weeks Of Treatment: 54 History: Received Radiation Clustered Wound: Yes Photos Wound Measurements Length: (cm) Width: (cm) Depth: (cm) Clustered Quantity: Area: (cm) Volume: (cm) 5 % Reduction in Area: 62.5% 4.5 % Reduction in Volume: 62.5% 0.1 Epithelialization: Small (1-33%) 1 Tunneling: No 17.671 Undermining:  No 1.767 Wound Description Classification: Full Thickness Without Exposed Sup Wound Margin: Distinct, outline attached Exudate Amount: Large Exudate Type: Serous Exudate Color: amber port Structures Foul Odor After Cleansing: No Slough/Fibrino Yes Wound Bed Granulation Amount: Small (1-33%) Exposed Structure Granulation Quality: Red Fascia Exposed: No Necrotic Amount: Large (67-100%) Fat Layer (Subcutaneous Tissue) Exposed: Yes Necrotic Quality: Eschar Tendon Exposed: No Muscle Exposed: No Joint Exposed: No Bone Exposed: No Periwound Skin Texture Texture Color No Abnormalities Noted: No No Abnormalities Noted: No Callus: No Atrophie Blanche: No Crepitus: No Cyanosis: No Excoriation: No Ecchymosis: No Induration: No Erythema: No Rash: No Hemosiderin Staining: Yes Scarring: Yes Mottled: No Pallor: No Moisture Rubor: No No Abnormalities Noted: No Dry / Scaly: No Temperature / Pain Maceration: No Temperature: No Abnormality Winchel, Adekunle ([id number]) [id number] Page 16 of 16 Treatment Notes Wound #7 (Lower Leg) Wound Laterality: Left, Medial Cleanser Soap and Water Discharge Instruction: May shower and wash wound with dial antibacterial soap and water prior to dressing change. Peri-Wound Care Sween Lotion (Moisturizing lotion) Discharge Instruction: Apply moisturizing lotion as directed Topical Primary Dressing Maxorb Extra CMC/Alginate Dressing, 4x4 (in/in) Discharge Instruction: Apply to wound bed as instructed Secondary Dressing ABD Pad, 8x10 Discharge Instruction: Apply over primary dressing as directed. Zetuvit Plus 4x8 in Discharge Instruction: Apply over primary dressing as directed. OptiLock Super Absorbent, 5x5.5 (in/in) Discharge Instruction: Apply directly to wound bed as directed Secured With Compression Wrap Urgo K2, (equivalent to a 4 layer) two layer compression system, regular Discharge Instruction: Apply Urgo  K2 as directed (alternative to 4 layer compression). Compression Stockings Add-Ons Electronic Signature(s) Signed: 04/10/2023 11:01:27 AM By: Thayer Dallas Entered By: Thayer Dallas on 04/08/2023 12:59:34 -------------------------------------------------------------------------------- Vitals Details Patient Name: Date of Service: Malinowski, [name] 04/08/2023 2:45 PM Medical Record Number: [id number] Patient Account Number: [id number] Date of Birth/Sex: Treating RN: 1946-03-16 (77 y.o. M) Primary Care Ruta Capece: [name] Other Clinician: Referring Nishanth Mccaughan: Treating Kaelene Elliston/Extender: Valentina Gu Weeks in Treatment: 52 Vital Signs Time Taken: 16:02 Temperature (F): 97.8 Height (in): 74 Pulse (bpm): 82 Weight (lbs): 250 Respiratory Rate (breaths/min): 18 Body Mass Index (BMI): 32.1 Blood Pressure (mmHg): 176/70 Reference Range: 80 - 120 mg / dl Electronic Signature(s) Signed: 04/10/2023 11:01:27 AM By: Thayer Dallas Entered By: Thayer Dallas on 04/08/2023 13:03:19

## 2023-04-15 ENCOUNTER — Encounter (HOSPITAL_BASED_OUTPATIENT_CLINIC_OR_DEPARTMENT_OTHER): Payer: Medicare Other | Admitting: Physician Assistant

## 2023-04-15 DIAGNOSIS — I87333 Chronic venous hypertension (idiopathic) with ulcer and inflammation of bilateral lower extremity: Secondary | ICD-10-CM | POA: Diagnosis not present

## 2023-04-20 NOTE — Progress Notes (Signed)
[name], [name] [name] ([id number]) [id number] Page 1 of 11 Visit Report for 04/15/2023 Arrival Information Details Patient Name: Date of Service: [name] [name] [name] 04/15/2023 2:00 PM Medical Record Number: [id number] Patient Account Number: [id number] Date of Birth/Sex: Treating RN: 28-Apr-1946 (77 y.o. [name] Primary Care [place] Spees: [name] Other Clinician: Referring Laquinta Hazell: Treating Joane Postel/Extender: [name] in Treatment: 30 Visit Information History Since Last Visit Added or deleted any medications: No Patient Arrived: Gilmer Mor Any new allergies or adverse reactions: No Arrival Time: 14:27 Had a fall or experienced change in No Accompanied By: self activities of daily living that may affect Transfer Assistance: Manual risk of falls: Patient Identification Verified: Yes Signs or symptoms of abuse/neglect since last visito No Patient Requires Transmission-Based Precautions: No Hospitalized since last visit: No Patient Has Alerts: Yes Implantable device outside of the clinic excluding No Patient Alerts: Patient on Blood Thinner cellular tissue based products placed in the center Right ABI in clinic Agency since last visit: Has Dressing in Place as Prescribed: Yes Has Compression in Place as Prescribed: Yes Pain Present Now: No Electronic Signature(s) Signed: 04/20/2023 12:28:15 PM By: [name] RN Entered By: [name] on 04/15/2023 11:28:05 -------------------------------------------------------------------------------- Compression Therapy Details Patient Name: Date of Service: [name] [name] [name] 04/15/2023 2:00 PM Medical Record Number: [id number] Patient Account Number: [id number] Date of Birth/Sex: Treating RN: 07/18/1945 (77 y.o. [name] Primary Care Alece Koppel: [name] Other Clinician: Referring Camdan Burdi: Treating Darrell Leonhardt/Extender: [name] Weeks in  Treatment: 34 Compression Therapy Performed for Wound Assessment: Wound #16 Left,Lateral Lower [name], RN Compression Type: Four Layer Notes Harlene Salts Electronic Signature(s) Signed: 04/20/2023 12:28:15 PM By: [name] RN Entered By: [name] on 04/15/2023 11:52:48 [name], [name] [name] ([id number]) [id number] Page 2 of 11 -------------------------------------------------------------------------------- Compression Therapy Details Patient Name: Date of Service: [name] [name] [name] 04/15/2023 2:00 PM Medical Record Number: [id number] Patient Account Number: [id number] Date of Birth/Sex: Treating RN: [birth year] (77 y.o. [name] Primary Care Quinnlan Abruzzo: [name] Other Clinician: Referring Faraaz Wolin: Treating Kashawna Manzer/Extender: [name] Weeks in Treatment: 34 Compression Therapy Performed for Wound Assessment: Wound #12 Left,Anterior Lower [name], RN Compression Type: Four Layer Notes Harlene Salts Electronic Signature(s) Signed: 04/20/2023 12:28:15 PM By: [name] RN Entered By: [name] on 04/15/2023 11:52:48 -------------------------------------------------------------------------------- Compression Therapy Details Patient Name: Date of Service: [name] [name] [name] 04/15/2023 2:00 PM Medical Record Number: [id number] Patient Account Number: [id number] Date of Birth/Sex: Treating RN: May 13, 1946 (77 y.o. [name] Primary Care Marla Pouliot: [name] Other Clinician: Referring Taria Castrillo: Treating Richmond Coldren/Extender: [name] Weeks in Treatment: 40 Compression Therapy Performed for Wound Assessment: Wound #15 Left,Distal,Posterior Lower [name], RN Compression Type: Four Layer Notes Harlene Salts Electronic Signature(s) Signed: 04/20/2023 12:28:15 PM By: [name]  RN Entered By: [name] on 04/15/2023 11:52:48 -------------------------------------------------------------------------------- Compression Therapy Details Patient Name: Date of Service: [name] [name] [name] 04/15/2023 2:00 PM Medical Record Number: [id number] Patient Account Number: [id number] Date of Birth/Sex: Treating RN: 1945/11/15 (77 y.o. [name] Primary Care Ravi Tuccillo: [name] Other Clinician: Referring Samiksha Pellicano: Treating Polo Mcmartin/Extender: [name] Weeks in Treatment: 77 Compression Therapy Performed for Wound Assessment: Wound #6 Right,Medial Lower [name], RN Compression Type: Four Layer Notes Harlene Salts Electronic Signature(s) Signed: 04/20/2023 12:28:15 PM By: [name] RN Entered  By: [name] on 04/15/2023 11:52:48 [name] [name] ([id number]) [id number] Page 3 of 11 -------------------------------------------------------------------------------- Compression Therapy Details Patient Name: Date of Service: [name] [name] [name] 04/15/2023 2:00 PM Medical Record Number: [id number] Patient Account Number: [id number] Date of Birth/Sex: Treating RN: January 25, 1946 (77 y.o. [name] Primary Care Kiwanna Spraker: [name] Other Clinician: Referring Lourdes Manning: Treating Adrik Khim/Extender: [name] Weeks in Treatment: 40 Compression Therapy Performed for Wound Assessment: Wound #7 Left,Medial Lower [name], RN Compression Type: Four Layer Notes Harlene Salts Electronic Signature(s) Signed: 04/20/2023 12:28:15 PM By: [name] RN Entered By: [name] on 04/15/2023 11:52:48 -------------------------------------------------------------------------------- Encounter Discharge Information Details Patient Name: Date of Service: [name] [name] [name] 04/15/2023 2:00 PM Medical Record Number:  [id number] Patient Account Number: [id number] Date of Birth/Sex: Treating RN: 1945-07-07 (78 y.o. [name] Primary Care Martino Tompson: [name] Other Clinician: Referring Codylee Patil: Treating Camerin Jimenez/Extender: [name] Weeks in Treatment: 61 Encounter Discharge Information Items Discharge Condition: Stable Ambulatory Status: Cane Discharge Destination: Home Transportation: Private Auto Accompanied By: self Schedule Follow-up Appointment: Yes Clinical Summary of Care: Patient Declined Electronic Signature(s) Signed: 04/20/2023 12:28:15 PM By: [name] RN Entered By: [name] on 04/15/2023 11:55:00 -------------------------------------------------------------------------------- Patient/Caregiver Education Details Patient Name: Date of Service: [name] [name] [name] 11/27/2024andnbsp2:00 PM Medical Record Number: [id number] Patient Account Number: [id number] Date of Birth/Gender: Treating RN: 1945-07-16 (77 y.o. [name] Primary Care Physician: [name] Other Clinician: Referring Physician: Treating Physician/Extender: [name] Weeks in Treatment: 55 Coco, [name] [name] ([id number]) [id number] Page 4 of 11 Education Assessment Education Provided To: Patient Education Topics Provided Wound/Skin Impairment: Methods: Explain/Verbal Responses: State content correctly Electronic Signature(s) Signed: 04/20/2023 12:28:15 PM By: [name] RN Entered By: [name] on 04/15/2023 11:54:30 -------------------------------------------------------------------------------- Wound Assessment Details Patient Name: Date of Service: [name] [name] [name] 04/15/2023 2:00 PM Medical Record Number: [id number] Patient Account Number: [id number] Date of Birth/Sex: Treating RN: October 19, 1945 (77 y.o. [name] Primary Care Yarel Kilcrease: [name] Other Clinician: Referring  Nohelani Benning: Treating Dawayne Ohair/Extender: [name] Weeks in Treatment: 87 Wound Status Wound Number: 12 Primary Lymphedema Etiology: Wound Location: Left, Anterior Lower Leg Wound Open Wounding Event: Frostbite Status: Date Acquired: 12/17/2022 Comorbid Anemia, Lymphedema, Deep Vein Thrombosis, Hypertension, Weeks Of Treatment: 17 History: Received Radiation Clustered Wound: No Wound Measurements Length: (cm) 3.5 Width: (cm) 2 Depth: (cm) 0.1 Area: (cm) 5.498 Volume: (cm) 0.55 % Reduction in Area: -55.6% % Reduction in Volume: 22.2% Epithelialization: Small (1-33%) Tunneling: No Undermining: No Wound Description Classification: Full Thickness Without Exposed Suppor Wound Margin: Distinct, outline attached Exudate Amount: Large Exudate Type: Serous Exudate Color: amber t Structures Foul Odor After Cleansing: No Slough/Fibrino Yes Wound Bed Granulation Amount: Medium (34-66%) Exposed Structure Granulation Quality: Red, Pink Fascia Exposed: No Necrotic Amount: Medium (34-66%) Fat Layer (Subcutaneous Tissue) Exposed: Yes Necrotic Quality: Adherent Slough Tendon Exposed: No Muscle Exposed: No Joint Exposed: No Bone Exposed: No Periwound Skin Texture Texture Color No Abnormalities Noted: No No Abnormalities Noted: No Callus: No Atrophie Blanche: No Crepitus: No Cyanosis: No Excoriation: No Ecchymosis: No Induration: No Erythema: No Rash: No Hemosiderin Staining: Yes Scarring: No Mottled: No Yahr, Shelvy ([id number]) [id number] Page 5 of 11 Pallor: No Moisture Rubor: No No Abnormalities Noted: No Dry / Scaly: No Temperature / Pain Maceration: No Temperature: No Abnormality Treatment Notes Wound #12 (Lower Leg) Wound Laterality: Left, Anterior Cleanser Soap and Water Discharge Instruction: May  shower and wash wound with dial antibacterial soap and water prior to dressing change. Peri-Wound Care Sween  Lotion (Moisturizing lotion) Discharge Instruction: Apply moisturizing lotion as directed Topical Primary Dressing Maxorb Extra CMC/Alginate Dressing, 4x4 (in/in) Discharge Instruction: Apply to wound bed as instructed Secondary Dressing ABD Pad, 8x10 Discharge Instruction: Apply over primary dressing as directed. Zetuvit Plus 4x8 in Discharge Instruction: Apply over primary dressing as directed. OptiLock Super Absorbent, 5x5.5 (in/in) Discharge Instruction: Apply directly to wound bed as directed Secured With Compression Wrap Urgo K2, (equivalent to a 4 layer) two layer compression system, regular Discharge Instruction: Apply Urgo K2 as directed (alternative to 4 layer compression). Compression Stockings Add-Ons Electronic Signature(s) Signed: 04/20/2023 12:28:15 PM By: [name] RN Entered By: [name] on 04/15/2023 11:28:54 -------------------------------------------------------------------------------- Wound Assessment Details Patient Name: Date of Service: [name] [name] [name] 04/15/2023 2:00 PM Medical Record Number: [id number] Patient Account Number: [id number] Date of Birth/Sex: Treating RN: Dec 28, 1945 (77 y.o. [name] Primary Care Zohair Epp: [name] Other Clinician: Referring Kevon Tench: Treating Zonnique Norkus/Extender: [name] Weeks in Treatment: 22 Wound Status Wound Number: 15 Primary Lymphedema Etiology: Wound Location: Left, Distal, Posterior Lower Leg Wound Open Wounding Event: Gradually Appeared Status: Date Acquired: 01/14/2023 Comorbid Anemia, Lymphedema, Deep Vein Thrombosis, Hypertension, Weeks Of Treatment: 8 History: Received Radiation Clustered Wound: No Wound Measurements Length: (cm) 3.5 Width: (cm) 5 Depth: (cm) 0.1 Stoutenburg, [name] ([id number]) Area: (cm) 13.744 Volume: (cm) 1.374 % Reduction in Area: -55.5% % Reduction in Volume: -55.4% Epithelialization: Small  (1-33%) [id number] Page 6 of 11 Tunneling: No Undermining: No Wound Description Classification: Full Thickness Without Exposed Suppor Wound Margin: Distinct, outline attached Exudate Amount: Medium Exudate Type: Serosanguineous Exudate Color: red, brown t Structures Foul Odor After Cleansing: No Slough/Fibrino Yes Wound Bed Granulation Amount: Small (1-33%) Exposed Structure Granulation Quality: Pink Fascia Exposed: No Necrotic Amount: Large (67-100%) Fat Layer (Subcutaneous Tissue) Exposed: Yes Necrotic Quality: Adherent Slough Tendon Exposed: No Muscle Exposed: No Joint Exposed: No Bone Exposed: No Periwound Skin Texture Texture Color No Abnormalities Noted: No No Abnormalities Noted: No Callus: No Atrophie Blanche: No Crepitus: No Cyanosis: No Excoriation: No Ecchymosis: No Induration: No Erythema: No Rash: No Hemosiderin Staining: Yes Scarring: No Mottled: No Pallor: No Moisture Rubor: No No Abnormalities Noted: No Dry / Scaly: No Maceration: No Treatment Notes Wound #15 (Lower Leg) Wound Laterality: Left, Posterior, Distal Cleanser Soap and Water Discharge Instruction: May shower and wash wound with dial antibacterial soap and water prior to dressing change. Peri-Wound Care Sween Lotion (Moisturizing lotion) Discharge Instruction: Apply moisturizing lotion as directed Topical Primary Dressing Maxorb Extra CMC/Alginate Dressing, 4x4 (in/in) Discharge Instruction: Apply to wound bed as instructed Secondary Dressing ABD Pad, 8x10 Discharge Instruction: Apply over primary dressing as directed. Zetuvit Plus 4x8 in Discharge Instruction: Apply over primary dressing as directed. OptiLock Super Absorbent, 5x5.5 (in/in) Discharge Instruction: Apply directly to wound bed as directed Secured With Compression Wrap Urgo K2, (equivalent to a 4 layer) two layer compression system, regular Discharge Instruction: Apply Urgo K2 as  directed (alternative to 4 layer compression). Compression Stockings Add-Ons Electronic Signature(s) Signed: 04/20/2023 12:28:15 PM By: [name] RN Entered By: [name] on 04/15/2023 11:29:18 [name] [name] ([id number]) [id number] Page 7 of 11 -------------------------------------------------------------------------------- Wound Assessment Details Patient Name: Date of Service: [name] [name] [name] 04/15/2023 2:00 PM Medical Record Number: [id number] Patient Account Number: [id number] Date of Birth/Sex: Treating RN: 08/18/1945 (77 y.o. [name] Primary Care Sinai Mahany: [name] Other Clinician: Referring  Syndey [name]: Treating Garrie Woodin/Extender: Leveda Anna, [name] Weeks in Treatment: 55 Wound Status Wound Number: 16 Primary Venous Leg Ulcer Etiology: Wound Location: Left, Lateral Lower Leg Wound Open Wounding Event: Blister Status: Date Acquired: 04/01/2023 Comorbid Anemia, Lymphedema, Deep Vein Thrombosis, Hypertension, Weeks Of Treatment: 2 History: Received Radiation Clustered Wound: No Wound Measurements Length: (cm) 2 Width: (cm) 2.5 Depth: (cm) 0.1 Area: (cm) 3.927 Volume: (cm) 0.393 % Reduction in Area: 28.8% % Reduction in Volume: 28.7% Epithelialization: Small (1-33%) Tunneling: No Undermining: No Wound Description Classification: Full Thickness Without Exposed Suppor Wound Margin: Distinct, outline attached Exudate Amount: Medium Exudate Type: Serous Exudate Color: amber t Structures Foul Odor After Cleansing: No Slough/Fibrino No Wound Bed Granulation Amount: Large (67-100%) Exposed Structure Granulation Quality: Red Fascia Exposed: No Necrotic Amount: None Present (0%) Fat Layer (Subcutaneous Tissue) Exposed: No Tendon Exposed: No Muscle Exposed: No Joint Exposed: No Bone Exposed: No Periwound Skin Texture Texture Color No Abnormalities Noted: No No Abnormalities Noted: No Callus:  No Atrophie Blanche: No Crepitus: No Cyanosis: No Excoriation: No Ecchymosis: No Induration: No Erythema: No Rash: No Hemosiderin Staining: No Scarring: No Mottled: No Pallor: No Moisture Rubor: No No Abnormalities Noted: No Dry / Scaly: No Maceration: No Treatment Notes Wound #16 (Lower Leg) Wound Laterality: Left, Lateral Cleanser Soap and Water Discharge Instruction: May shower and wash wound with dial antibacterial soap and water prior to dressing change. Peri-Wound Care Sween Lotion (Moisturizing lotion) Discharge Instruction: Apply moisturizing lotion as directed Settlemyre, Quantel ([id number]) [id number] Page 8 of 11 Topical Primary Dressing Maxorb Extra CMC/Alginate Dressing, 4x4 (in/in) Discharge Instruction: Apply to wound bed as instructed Secondary Dressing ABD Pad, 8x10 Discharge Instruction: Apply over primary dressing as directed. Zetuvit Plus 4x8 in Discharge Instruction: Apply over primary dressing as directed. OptiLock Super Absorbent, 5x5.5 (in/in) Discharge Instruction: Apply directly to wound bed as directed Secured With Compression Wrap Urgo K2, (equivalent to a 4 layer) two layer compression system, regular Discharge Instruction: Apply Urgo K2 as directed (alternative to 4 layer compression). Compression Stockings Add-Ons Electronic Signature(s) Signed: 04/20/2023 12:28:15 PM By: [name] RN Entered By: [name] on 04/15/2023 11:32:29 -------------------------------------------------------------------------------- Wound Assessment Details Patient Name: Date of Service: [name] [name] [name] 04/15/2023 2:00 PM Medical Record Number: [id number] Patient Account Number: [id number] Date of Birth/Sex: Treating RN: [birth year] (77 y.o. [name] Primary Care Lidia Clavijo: [name] Other Clinician: Referring Zondra Lawlor: Treating Karlo Goeden/Extender: [name] Weeks in Treatment:  17 Wound Status Wound Number: 6 Primary Lymphedema Etiology: Wound Location: Right, Medial Lower Leg Wound Open Wounding Event: Gradually Appeared Status: Date Acquired: 03/05/2022 Comorbid Anemia, Lymphedema, Deep Vein Thrombosis, Hypertension, Weeks Of Treatment: 55 History: Received Radiation Clustered Wound: Yes Wound Measurements Length: (cm) Width: (cm) Depth: (cm) Clustered Quantity: Area: (cm) Volume: (cm) 8 % Reduction in Area: 60.4% 9 % Reduction in Volume: -18.7% 0.3 Epithelialization: Small (1-33%) 1 Tunneling: No 56.549 Undermining: No 16.965 Wound Description Classification: Full Thickness Without Exposed Sup Wound Margin: Distinct, outline attached Exudate Amount: Large Exudate Type: Serous Exudate Color: amber port Structures Foul Odor After Cleansing: No Slough/Fibrino Yes Wound Bed Granulation Amount: Small (1-33%) Exposed Structure Granulation Quality: Pink Fascia Exposed: No Necrotic Amount: Large (67-100%) Fat Layer (Subcutaneous Tissue) Exposed: Yes Necrotic Quality: Adherent Slough Tendon Exposed: No Muscle Exposed: No Dunford, [name] [name] ([id number]) [id number] Page 9 of 11 Joint Exposed: No Bone Exposed: No Periwound Skin Texture Texture Color No Abnormalities Noted: No No Abnormalities Noted: No Callus: No Atrophie Blanche: No  Crepitus: No Cyanosis: No Excoriation: No Ecchymosis: No Induration: No Erythema: No Rash: No Hemosiderin Staining: Yes Scarring: Yes Mottled: No Pallor: No Moisture Rubor: No No Abnormalities Noted: No Dry / Scaly: No Temperature / Pain Maceration: No Temperature: No Abnormality Treatment Notes Wound #6 (Lower Leg) Wound Laterality: Right, Medial Cleanser Soap and Water Discharge Instruction: May shower and wash wound with dial antibacterial soap and water prior to dressing change. Peri-Wound Care Sween Lotion (Moisturizing lotion) Discharge Instruction: Apply  moisturizing lotion as directed Topical Primary Dressing Maxorb Extra CMC/Alginate Dressing, 4x4 (in/in) Discharge Instruction: Apply to wound bed as instructed Secondary Dressing ABD Pad, 8x10 Discharge Instruction: Apply over primary dressing as directed. Zetuvit Plus 4x8 in Discharge Instruction: Apply over primary dressing as directed. OptiLock Super Absorbent, 5x5.5 (in/in) Discharge Instruction: Apply directly to wound bed as directed Secured With Compression Wrap Urgo K2, (equivalent to a 4 layer) two layer compression system, regular Discharge Instruction: Apply Urgo K2 as directed (alternative to 4 layer compression). Compression Stockings Add-Ons Electronic Signature(s) Signed: 04/20/2023 12:28:15 PM By: [name] RN Entered By: [name] on 04/15/2023 11:43:07 -------------------------------------------------------------------------------- Wound Assessment Details Patient Name: Date of Service: Uphoff, [name] [name] 04/15/2023 2:00 PM Medical Record Number: [id number] Patient Account Number: [id number] Date of Birth/Sex: Treating RN: [birth year] (77 y.o. [name] Primary Care Syrai Gladwin: [name] Other Clinician: Referring Opal Mckellips: Treating Dereon Corkery/Extender: [name] in Treatment: 38 Lungren, [name] [name] ([id number]) [id number] Page 10 of 11 Wound Status Wound Number: 7 Primary Lymphedema Etiology: Wound Location: Left, Medial Lower Leg Wound Open Wounding Event: Gradually Appeared Status: Date Acquired: 03/05/2022 Comorbid Anemia, Lymphedema, Deep Vein Thrombosis, Hypertension, Weeks Of Treatment: 55 History: Received Radiation Clustered Wound: Yes Wound Measurements Length: (cm) Width: (cm) Depth: (cm) Clustered Quantity: Area: (cm) Volume: (cm) 5 % Reduction in Area: 62.5% 4.5 % Reduction in Volume: 62.5% 0.1 Epithelialization: Small (1-33%) 1 Tunneling: No 17.671 Undermining:  No 1.767 Wound Description Classification: Full Thickness Without Exposed Sup Wound Margin: Distinct, outline attached Exudate Amount: Large Exudate Type: Serous Exudate Color: amber port Structures Foul Odor After Cleansing: No Slough/Fibrino Yes Wound Bed Granulation Amount: Small (1-33%) Exposed Structure Granulation Quality: Red Fascia Exposed: No Necrotic Amount: Large (67-100%) Fat Layer (Subcutaneous Tissue) Exposed: Yes Necrotic Quality: Eschar Tendon Exposed: No Muscle Exposed: No Joint Exposed: No Bone Exposed: No Periwound Skin Texture Texture Color No Abnormalities Noted: No No Abnormalities Noted: No Callus: No Atrophie Blanche: No Crepitus: No Cyanosis: No Excoriation: No Ecchymosis: No Induration: No Erythema: No Rash: No Hemosiderin Staining: Yes Scarring: Yes Mottled: No Pallor: No Moisture Rubor: No No Abnormalities Noted: No Dry / Scaly: No Temperature / Pain Maceration: No Temperature: No Abnormality Treatment Notes Wound #7 (Lower Leg) Wound Laterality: Left, Medial Cleanser Soap and Water Discharge Instruction: May shower and wash wound with dial antibacterial soap and water prior to dressing change. Peri-Wound Care Sween Lotion (Moisturizing lotion) Discharge Instruction: Apply moisturizing lotion as directed Topical Primary Dressing Maxorb Extra CMC/Alginate Dressing, 4x4 (in/in) Discharge Instruction: Apply to wound bed as instructed Secondary Dressing ABD Pad, 8x10 Discharge Instruction: Apply over primary dressing as directed. Zetuvit Plus 4x8 in Discharge Instruction: Apply over primary dressing as directed. OptiLock Super Absorbent, 5x5.5 (in/in) Discharge Instruction: Apply directly to wound bed as directed Blash, Dywane ([id number]) [id number] Page 11 of 11 Secured With Compression Wrap Urgo K2, (equivalent to a 4 layer) two layer compression system, regular Discharge Instruction: Apply Urgo  K2 as directed (alternative to 4 layer  compression). Compression Stockings Add-Ons Electronic Signature(s) Signed: 04/20/2023 12:28:15 PM By: [name] RN Entered By: [name] on 04/15/2023 11:43:37 -------------------------------------------------------------------------------- Vitals Details Patient Name: Date of Service: Quinto, [name] [name] 04/15/2023 2:00 PM Medical Record Number: [id number] Patient Account Number: [id number] Date of Birth/Sex: Treating RN: [birth year] (77 y.o. [name] Primary Care Rossi Burdo: [name] Other Clinician: Referring Celicia Minahan: Treating Cypress Hinkson/Extender: [name] Weeks in Treatment: 39 Vital Signs Time Taken: 14:28 Respiratory Rate (breaths/min): 18 Height (in): 74 Reference Range: 80 - 120 mg / dl Weight (lbs): 782 Body Mass Index (BMI): 32.1 Electronic Signature(s) Signed: 04/20/2023 12:28:15 PM By: [name] RN Entered By: [name] on 04/15/2023 11:28:31

## 2023-04-22 ENCOUNTER — Encounter (HOSPITAL_BASED_OUTPATIENT_CLINIC_OR_DEPARTMENT_OTHER): Payer: Medicare Other | Attending: Internal Medicine | Admitting: Internal Medicine

## 2023-04-22 DIAGNOSIS — L97828 Non-pressure chronic ulcer of other part of left lower leg with other specified severity: Secondary | ICD-10-CM | POA: Insufficient documentation

## 2023-04-22 DIAGNOSIS — I130 Hypertensive heart and chronic kidney disease with heart failure and stage 1 through stage 4 chronic kidney disease, or unspecified chronic kidney disease: Secondary | ICD-10-CM | POA: Diagnosis not present

## 2023-04-22 DIAGNOSIS — Z7901 Long term (current) use of anticoagulants: Secondary | ICD-10-CM | POA: Insufficient documentation

## 2023-04-22 DIAGNOSIS — Z923 Personal history of irradiation: Secondary | ICD-10-CM | POA: Insufficient documentation

## 2023-04-22 DIAGNOSIS — I5042 Chronic combined systolic (congestive) and diastolic (congestive) heart failure: Secondary | ICD-10-CM | POA: Insufficient documentation

## 2023-04-22 DIAGNOSIS — I872 Venous insufficiency (chronic) (peripheral): Secondary | ICD-10-CM | POA: Diagnosis not present

## 2023-04-22 DIAGNOSIS — Z8546 Personal history of malignant neoplasm of prostate: Secondary | ICD-10-CM | POA: Diagnosis not present

## 2023-04-22 DIAGNOSIS — N1832 Chronic kidney disease, stage 3b: Secondary | ICD-10-CM | POA: Insufficient documentation

## 2023-04-22 DIAGNOSIS — L97818 Non-pressure chronic ulcer of other part of right lower leg with other specified severity: Secondary | ICD-10-CM | POA: Diagnosis not present

## 2023-04-22 DIAGNOSIS — Z86718 Personal history of other venous thrombosis and embolism: Secondary | ICD-10-CM | POA: Insufficient documentation

## 2023-04-22 DIAGNOSIS — I890 Lymphedema, not elsewhere classified: Secondary | ICD-10-CM | POA: Insufficient documentation

## 2023-04-22 DIAGNOSIS — D649 Anemia, unspecified: Secondary | ICD-10-CM | POA: Diagnosis not present

## 2023-04-22 DIAGNOSIS — I87333 Chronic venous hypertension (idiopathic) with ulcer and inflammation of bilateral lower extremity: Secondary | ICD-10-CM | POA: Diagnosis present

## 2023-04-23 NOTE — Progress Notes (Signed)
[name], [name] ([id number]) [id number] Page 1 of 14 Visit Report for 04/22/2023 HPI Details Patient Name: Date of Service: [name], [name] 04/22/2023 3:00 PM Medical Record Number: [id number] Patient Account Number: [id number] Date of Birth/Sex: Treating RN: August 01, 1945 (77 y.o. M) Primary Care Provider: [name] Other Clinician: Referring Provider: Treating Provider/Extender: Valentina Gu Weeks in Treatment: 67 History of Present Illness HPI Description: ADMISSION 02/23/2020 Patient is a 77 year old man who lives in [place] who arrives accompanied by his wife. He has a history of chronic lymphedema and venous insufficiency in his bilateral lower legs which may have something to do that with having a history of DVT as well as being treated for prostate cancer. In any case he recently got compression pumps at home but compliance has been an issue here. He has compression stockings however they are probably not sufficient enough to control swelling. They tell us that things deteriorated for him in late August he was admitted to [facility] for 7 days. This was with cellulitis I think of his bilateral lower legs. Discharge he was noted to have wounds on his bilateral lower legs. He was discharged on Bactrim. They tried to get him home health through [facility] of course they declined him. His wife is been wrapping these applying some form of silver foam dressing. He has a history of wounds before although nothing that would not heal with basic home topical dressings. He has 2 areas on the left medial, left anterior and left lateral and a smaller area on the right medial. All of these have considerable depth. Past medical history includes iron deficiency anemia, lymphedema followed by the rehab center at [facility] with lymphedema wraps I believe, DVT on chronic anticoagulation, prostate cancer, chronic venous  insufficiency, hypertension. As mentioned he has compression pumps but does not use them. ABIs in our clinic were noncompressible bilaterally 10/14; patient with severe bilateral lymphedema right greater than left. He came in with bilateral lower extremity wounds left greater than right. Even though the right side has more of the edema most of the wounds here almost closed on the right medial. He has 3 remaining wounds on the left We have been using silver alginate under 4-layer compression I have been trying to get him to be compliant with his external compression pumps 10/21; patient with 3 small wounds on the left leg and 1 on the right medial in the setting of severe lymphedema and chronic venous insufficiency. We have been using silver alginate under 4-layer compression he is using his external compression pumps twice a day 11/4; ARTERIAL STUDIES on the right show an ABI of 1.02 TBI of 0.858 with biphasic waveforms on the left 0.98 with a TBI of 0.55 and biphasic waveforms. Does not look like he has significant arterial disease. We are treating him for lymphedema he has compression pumps. He has punched-out areas on the left anterior left lateral and right medial lower extremities 11/11; after we obtained his arterial studies I put him in 4 layer compression. He is using his compression pumps probably once a day although I have asked him to do twice. Primary dressing to the wound is silver collagen he has severe lymphedema likely secondary to chronic venous insufficiency. Wounds on the left lateral, left medial and left anterior and a small area on the right medial 12/2; the area on the right anterior lower leg has healed. We initially thought that the area medially had healed as  well however when her discharge nurse came in she detected fluid in the wound simply opened up. This is actually worse than I remember this pain. The area on the left lateral potentially slightly smaller He is also  complaining about pain in his left hand he says that this is actually been getting some better he has been using topical creams on this. She asked that I look at this 12/9 after last weeks issues we have 2 wounds one on the right medial lower leg and 1 on the left lateral. Both of these are in the same condition. I think because of thickened skin secondary to chronic lymphedema these wounds actually have depth of almost 0.8 cm. 12/16; the patient has 2 small but deep wounds one on the right medial and one on the left lateral. The right medial is actually the worst of these. He arrives in clinic today with absolutely terrible edema in the right leg apparently his 4-layer wrap fell down to just above his ankle he did not think about this he is apparently been continuing to use his compression pump twice a day. The left leg looks a lot better. 05/09/2020 upon evaluation today patient appears to be doing decently well in regard to his wounds. Everything is measuring smaller the right leg still has a little bit deeper wound in the left seems to be almost completely healed in my opinion I am very pleased in general with how things are progressing. He has a 4- layer compression wrap we have been using endoform today we will probably have to use collagen just based on the fact that we do not have endoform it is on order. 1/6; the patient's wound on the left lateral lower leg has healed. Still has 1 on the right medial. 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 Using compression pumps at home twice a day. 1/13; left lateral lower leg is still healed. He has a deep punched out rectangular shaped wound on the right medial calf. Looking down at this it appears that he is attempting to epithelialize around the edges of the wound and on the base as well. His edema is reasonably well controlled we have been using collagen with absolutely no effect 1/20; left lateral lower leg remains closed he has  extremitease stockings. The area on the right medial calf I aggressively debrided last week measures larger but the surface looks better. We have been using Hydrofera Blue. We ran Oasis through his insurance but we have not seen the results of this 1/27; left lower leg wound with chronic venous insufficiency and secondary lymphedema. I did aggressive debridement on this last week the wound seems to have come in healthy looking surface using Hydrofera Blue. He was denied for Oasis 2/3; small divot in the right medial lower leg. Under illumination the walls of this divot are epithelialized however the base has slough which I removed with a curette we have been using Hydrofera Blue 2/10 small divot on the right medial lower leg pinpoint illumination at the base of this cone-shaped wound. We have been using Hydrofera Blue but I will switch to calcium alginate this week 2/17; the small divot on the right medial lower leg is fully epithelialized. There is no visible open area under illumination. He has his own stocking for the right leg similar to the one he has been wearing on the left. Lonsway, [name] ([id number]) [id number] Page 2 of 14 03/26/2022; READMISSION This is a now 77 year old man that  we had in the clinic from 02/23/2020 through 07/05/2020. At that point he had bilateral lower extremity wounds left greater than right in the setting of severe lymphedema. He had already obtained compression pumps ordered for him I think from the wound care clinic in [facility] so I do not really have record of what he has been using. He claims to be using them once a day but there is a problem with the sleeve on the left leg. About 2 weeks ago he was hospitalized from 03/11/2022 through 03/14/2022 with diastolic congestive heart failure. 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 He was diuresed. Developed some prerenal azotemia  and he has not been taking any diuretics currently. He has not been putting stockings on his legs since he got out of hospital and still has his legs dependent for long periods. Past medical history history of prostate cancer treated with prostatectomy and radiation this was apparently about 8 years ago, history of DVT on chronic Coumadin, history of lymphedema was managed for a while at the clinic in [place]. History of inguinal hernia repair in September 22, hypertension, stage IIIb chronic renal failure ABIs today were noncompressible on the right 1.12 on the left 04-02-2022 upon evaluation today patient appears to be doing well currently in regard to his legs I do feel like both areas that are draining are actually much drier than they were in the picture last week although the left is drier than the right. He is tolerating the 4-layer compression wraps at this point he did contact the pump company and they are actually working on getting him a new compression sleeve for one of his legs which have previously popped and was not functioning properly. 04-23-2022 upon evaluation today patient appears to be doing well currently in regard to his wounds on the legs. I am actually very pleased with where things stand and I do feel like that we are headed in the right direction. Fortunately there is no sign of active infection locally or systemically at this time. 05-07-2022 upon evaluation today patient appears to be doing well currently in regard to his wounds in fact things are showing signs of improvement which is good news I do not see too much that actually appears to be open and I am very pleased in that regard. No fevers, chills, nausea, vomiting, or diarrhea. 05-21-2022 upon evaluation today patient appears to be doing somewhat poorly in regard to drainage of his lower extremities bilaterally. The right is greater than left as far as the weeping area. Nonetheless it seems to be getting worse not  better. He actually has pitting edema which is at least 2+ to the thighs and I am concerned about the fact that he is may be fluid overloaded in general and that is the reason why we cannot get this under control. I know he is not using his pumps all the time because he actually told the nurse that he was either going to pump or he was going to use his fluid pills but not do both. For that reason I do think that he needs to be really doing both in order to get the fluid out as effectively as possible obviously with the 4-layer compression wraps were doing as much as we can from a compression standpoint but it is really not enough. He tells me that he elevates his leg is much as he can in between pumping and other  activity throughout the day. 05-28-2022 upon evaluation today patient appears to be doing better in regard to his wounds although the measurements may be a little bit larger this is a very difficult wound to heal it is very indistinct in a lot of areas. Nonetheless there is can be some need for sharp debridement in regard to both medial and lateral legs. Fortunately I see no signs of active infection locally nor systemically at this time. No fevers, chills, nausea, vomiting, or diarrhea. 06-04-2022 upon evaluation today patient appears to be doing poorly in general in regard to the wounds on his legs. He still continues to have a tremendous amount of fluid not just in the lower portion of his leg but to be honest his thigh where he has 2-3+ pitting edema in the thigh as well. Unfortunately I do not know that we will be able to get this healed effectively and keep it healed on the lower extremities unless he gets the overall fluid situation taking and under control. Fortunately I do not see any signs of infection locally nor systemically which is great news. He just seems to be very fluid overloaded. 06-11-2022 upon evaluation today patient presents for follow-up concerning his bilateral lower  extremity lymphedema secondary to chronic venous insufficiency. He has been tolerating the dressing changes with the compression wraps without complication. Fortunately I do not see any evidence of infection at this time which is great news. No fevers, chills, nausea, vomiting, or diarrhea. 06-18-2022 upon evaluation today patient appears to be doing well currently in regard to his wounds as far as not looking like they are terribly infected but nonetheless I am concerned about a subacute infection secondary to the fact that he continues to have spreading despite the compression therapy. We actually did do an Unna boot on him last week this is actually the first wrap that actually stayed up everything else has been sliding down quite significantly. Fortunately there does not appear to be any signs of infection systemically at this time. With that being said I do believe that locally there seems to be an issue going on here and again I Ernie Hew do a PCR culture to see what that shows also think that I am going to put him on a broad-spectrum antibiotic, doxycycline to see how that will help as well. He does tell me that coming into the clinic today that he was feeling short of breath like "he was about to have a heart attack" because he was having such a hard time breathing. He says that he told this to Dr. Jodelle Green his cardiologist as well when he was evaluated in the past 1 to 2 weeks. 07-02-2022 upon evaluation today patient appears to be doing poorly currently in regard to his wound. He has been tolerating the dressing changes. Unfortunately he has not had any compression wraps on for the past week because he was unable to make it in for his appointment last week. With that being said he has a significant amount of drainage he tells me has been using his pumps but despite this in the pumps he still has been draining quite a bit. The drainage is also somewhat purulent unfortunately. We did attempt to get in  touch with his cardiologist last week unfortunately we were unable to get up with him I did advise that the patient needs to get in touch with him upon leaving today in order to make sure they know he is on the new antibiotics I  am going to send him this will be Levaquin and Augmentin. 07-09-2022 upon evaluation today patient appears to be doing about the same in regard to his legs he may have just a slight amount of improvement with regard to the drainage probably Keystone topical antibiotics are helping in this regard to some degree. Fortunately there does not appear to be any signs of active infection systemically which is great news. No fevers, chills, nausea, vomiting, or diarrhea. 07-16-2022 upon evaluation today patient appears to be doing well currently in regard to his wound. He has been tolerating the dressing changes without complication. Fortunately there does not appear to be any signs of active infection locally nor systemically at this time. With that being said he cannot keep the wraps up he tells me on the left side he had to cut this down because it got too tight. He has been using his pumps but he is on the right side the wrap actually straight down causing some pushing around the central part of his leg just below the calf I think this is a bigger risk for him that help at this point. I think that we may need to try something different he should be getting his compression socks shortly he tells me they were ordered last Thursday. 3/6; ; this is a patient who lives in [place]. He has severe bilateral lymphedema. He has compression pumps, we have been using kerlix Ace wrap Keystone. He is changing the dressing. We do not have home health. 08-06-2022 upon evaluation today patient appears to be doing a little better in regard to his wounds in general at this point. Fortunately there does not appear to be any signs of active infection locally nor systemically at this time which is great  news and overall I am extremely pleased with where we stand today. 08-13-2022 upon evaluation today patient appears to actually be doing significantly better compared to last week. He actually did go to the hospital I told him that he needed to when he left here and he actually did go. With that being said they actually ended up admitting him he was having shortness of breath and I thought it might be related to congestive heart failure turns out he actually had a pulmonary embolism. Subsequently they were able to get him off of the Coumadin switching over to Eliquis to get things stabilized in that regard they also had them wrapped and got his swelling under control on his legs he actually looks much better pretty much across the board at this point. I am very pleased in that regard. With that being said I am very happy that he finally went that could have been a very dangerous situation. 08-20-2022 upon evaluation today patient appears to be doing well currently in regard to his wound. Has been tolerating the dressing changes without complication. Fortunately there does not appear to be any signs of active infection locally nor systemically at this time. I think his legs are doing better there is some need for sharp debridement today. [name], [name] ([id number]) [id number] Page 3 of 14 08-27-2022 upon evaluation patient is actually making excellent progress. I am actually very pleased with where he stands and I think that he is moving in the right direction. In general I think that we are looking pretty good at the moment. 09-03-2022 upon evaluation today patient appears to be doing well currently in regard to his wound. He is actually tolerating dressing changes on the left and right leg  without complication. Fortunately I do not see any need for debridement of the left leg the right leg I think we probably do some need to perform some debridement here. 09-10-2022 upon  evaluation today patient's wounds actually showed signs of improvement in both legs I do not see much is going require debridement today which was great news. Fortunately I do not see any evidence of infection which I think is also excellent he seems to be using his pumps and doing everything right I am happy about how this is progressing at this point. 09-17-2022 upon evaluation today patient appears to be doing decently well in regard to his wounds. He has been tolerating the dressing changes without complication. Fortunately there does not appear to be any signs of active infection at this time which is good news. 09-24-2022 upon evaluation today patient appears to be doing well currently in regard to his wounds. He has been tolerating the dressing changes without complication. Fortunately there does not appear to be any signs of active infection locally nor systemically which is great news. No fevers, chills, nausea, vomiting, or diarrhea. 10-08-2022 upon evaluation today patient appears to be doing excellent currently in regard to his wound. He has been tolerating the dressing changes without complication though it does not sound like he has been using his compression wraps for a bit here. He does think he was doing better with the [facility] topical antibiotics we can definitely go back to using that but I think the biggest issue here is that his swelling is just very out of control and needs to be under control. I discussed that with him today. 10-15-2022 upon evaluation today patient appears to be doing well currently in regard to his wounds. He is actually making some progress which is good news. Fortunately I do not see any evidence of active infection locally nor systemically which is great news as well. No fevers, chills, nausea, vomiting, or diarrhea. He does have a callused area on the plantar aspect of his left foot which is actually causing some pain and he wonders if I can trim this down for  him. 10-22-2022 upon evaluation today patient seems to be making progress. He is actually doing quite well and very pleased in that regard. I do not see any signs of active infection at this time. 11-05-2022 upon evaluation patient appears to be making progress although slowly towards closure. He seems to be doing well with regard to his legs were still using the [facility] topical antibiotics and he seems to be doing quite well. He is going require some sharp debridement today. 11-19-2022 upon evaluation today patient unfortunately appears to be extremely swollen at this point. He tells me that he ran out of supplies he also tells me his leg started leaking more because of not having supplies he was unable to wear his compression wraps that is the juxta fit compression wraps. Therefore his legs are extremely swollen much larger than normal and do not appear to be doing well at all today. He is going require some debridement I also think he is going require [place] to perform compression wrapping today. 11-26-2022 upon evaluation today patient appears to be doing well currently in regard to his legs as far as infection is concerned I see nothing that appears to be infected. Fortunately I do not see any signs of active infection systemically either which is also good news. With that being said he still is extremely swollen as far as his legs  are concerned. I do not see any signs of overall worsening but also do not see any signs of significant improvement which is the major issue here. 12-03-2022 upon evaluation today patient appears to be doing poorly still in regard to his legs the apparently has been taken off the wraps and not leaving the week by week. With that being said he has not had really the dressings to put on the release that is running out and subsequently though he is using his wraps I am not sure that he has been keeping them on like he needs to. I explained to him I really wanted to have the wraps  that I put on him left on until he comes back to see me so that we can keep the compression under better control. He voiced understanding today he tells me that he was "confused about that". Nonetheless based on what I am seeing right now also think that he is up on his feet too much I think he needs to get the feet elevated. 7/24; this is a patient with severe bilateral stage III lymphedema. On the right lower leg medially is a large area of macerated denuded skin was too small deeper areas in this. On the left leg he is 2 areas that are a little more standard in terms of wounds. Massive lymphedema bilaterally. He has compression pumps at home which he uses for 1 hour twice a day 12-17-2022 upon evaluation today patient's legs though a little bit smaller still continue to have significant issues with weeping here this just does not dry. I really think he needs to be changing this daily and this means we will probably have to try to go back to the Velcro wraps and give this a shot. I think may be going to the Velcro wraps, changing the dressing to a superabsorber like XtraSorb or the like, and have him elevate his legs and do his pumps 3 times per day is probably going to be the way to go to try to see if we can make some improvement here. 12-24-2022 upon evaluation today patient unfortunately is still struggling to get anything under control. We have been extremely aggressive and trying to: Control his swelling everything we do however seems to be met with resistance. He tells me that he is pumping 3 times per day with his lymphedema pumps. He also tells me that he is try to elevate his legs is much as possible and that subsequently he has been keeping his compression wraps on. He goes back and forth between saying that he can keep our wraps on and not but right now I really do not see that he is doing well with the juxta fit is at least not from the standpoint of improving the overall appearance I think  it does to some degree help maintain but we are still struggling here. I have voiced a concern to the patient that despite everything you are doing I am still not seeing a lot of improvement here and to be honest I am not sure what is can take get this under control he may have to go into the hospital for admission. 12-31-2022 upon evaluation today patient appears to be doing well currently in regard to his wounds. They seem a little better although he has several pustules around the legs in general but has been concerned about infection. I am actually going to go ahead and see about getting an antibiotic sent into the pharmacy today  although I am going to obtain a PCR culture as well. 01-07-2023 upon evaluation today patient appears to be doing well currently in regard to his wound. Has been tolerating the dressing changes without complication. He actually seems to be doing much better. He did have a PCR culture which showed Serratia as well and the prominent organisms here. Levaquin was actually a good option to treat the Serratia along with the other organisms that were problematic including E. coli. Nonetheless I think coupled with the topical Keystone antibiotics and the alginate he is really doing quite well today. 8/28; patient with severe bilateral lymphedema he has wounds bilaterally on his lower legs but they look a lot better today. We are using silver alginate and Keystone with Urgo K2 lite wraps. He comes in today with his wounds looking quite good. He was prescribed Levaquin last time I think he will complete this in a few days. 01-21-2023 upon evaluation today patient appears to be doing worse in regard to his swelling took his wrap off he tells me he believes on Saturday. With that being said his leg is significantly swollen since that time and unfortunately does not appear to be doing nearly as good as it was last time I saw him 2 weeks ago or even last week for that matter. I am very  concerned about this. 01-28-2023 upon evaluation today patient appears to be doing poorly currently in regard to his legs. He has been doing everything he says that we have recommended, he continues to use his lymphedema pumps 3 times a day, he has been doubling up on his fluid pills, he has been elevating his legs, and he tells me that he also has kept the wraps on and in fact he did have them on the day when he came in. Again that for feels the compression side of things. Nonetheless with everything going on here we still have not been able to get this completely under control. He continues to have issues with significant swelling of lower extremities and this is not limited to just his lower left portion of his legs but his thighs are fairly large as well. With that being said I really feel like that we have reached the extent of what we have to offer here at the wound care center I am wondering if we can see about making a referral for second opinion to [facility] to see if there is anything they could do to help him out at this point. It is really not much further from his home to there as it is from his home to here. Both are about an hour distance. He lives in [place]. [name], [name] ([id number]) [id number] Page 4 of 14 02-04-2023 upon evaluation today patient appears to be doing much better actually compared to last week's evaluation. This is actually significant improvement and overall I do not know exactly what he did different other than he tells me that he "has been taking his fluid pills every day instead of every other day. Obviously this is needed. I was under the understanding previously that he was taking this every day but I think he may also not been pumping every day like he was telling me but I cannot be sure this either way he does seem to be doing what he supposed to do at this point. 02-11-2023 upon evaluation today patient unfortunately  appears to be about as bad today as he appeared good last week. I am not sure  what happened other than the fact he did take his wrap off the right leg he tells me it got so tight that he could not feel his toes they are getting very numb. Subsequently he tells me that it was difficulty with driving therefore he took it off. With that being said I do believe that he may need to go to the ER for evaluation and treatment I feel he may need to be admitted to get some of the fluid off of his legs. I think that he is volume overloaded and at this point I do not think there is much working to be able to do to manage this outpatient we have tried pretty much everything that I am going to do. 02-18-23 upon evaluation today patient appears to be doing pretty well currently compared to last week this is actually better today. He is less weeping and the legs are actually significantly smaller. He tells me has been trying to stay in bed with his legs elevated more. 02-25-2023 upon evaluation today patient appears to be doing well currently in regard to his legs in general compared to where we have been. Fortunately I do not see any signs of active infection locally or systemically which is great news and in general I do believe that we are making good headway here towards complete closure. I am hopeful that he will continue to show signs of improvement as we progress going forward. He does seem to have been taking his fluid pills as well as elevating his legs and using lymphedema pumps on a regular basis. He is trying to stay out of the hospital. 03-04-2023 upon evaluation today patient appears to be doing well currently in regard to his wound. He has been tolerating the dressing changes without complication. Fortunately there does not appear to be any signs of active infection at this time. 03-11-2023 upon evaluation today patient appears to be doing well currently in regard to his wounds. He has been tolerating the  dressing changes without complication. Fortunately I do not see any signs of active infection at this time which is great news. 11/6; patient missed his clinic appointment last week. He took his wraps off at some point. Did not take his diuretics because he was nauseated last week. He comes in with massive bilateral edema which is lymphedema and probably some degree of systemic fluid overload. 11/13; patient with very poorly controlled lymphedema. We have been putting 4-layer equivalent wraps on him he says he is using his compression pumps twice a day. And elevating his legs nevertheless the edema control today is very poor. He has a substantial wound on the right medial lower leg also of the left medial and left posterior. We have been using alginate as a primary dressing or Aquacel Ag 11/20; some improvement in the amount of edema. We have been using 4-layer equivalent wraps and he is pumping with his external compression pumps twice a day. His legs had improved in terms of edema although there is still massively edematous. He tells me that he is on torsemide 1-1/2 tablets 20 mg tabs a day 12/4; massive edema extending up into his groin. Interestingly his edema in the thighs is more pitting then nonpitting lymphedema. His wounds are on both legs. This is not something that I think we can debride although I did do this last week I am not sure this did him any good. We have been using 4-layer compression he is using his pumps twice a  day. I misunderstood his torsemide dosage which apparently is 1 tablet alternating with a half a tablet daily. I think he probably needs more than that. Unfortunately he cannot get a hold of his cardiologist... Electronic Signature(s) Signed: 04/23/2023 4:45:28 AM By: Baltazar Najjar MD Entered By: Baltazar Najjar on 04/22/2023 16:24:13 -------------------------------------------------------------------------------- Physical Exam Details Patient Name: Date of  Service: [name], [name] 04/22/2023 3:00 PM Medical Record Number: [id number] Patient Account Number: [id number] Date of Birth/Sex: Treating RN: 1946-04-04 (77 y.o. M) Primary Care Provider: [name] Other Clinician: Referring Provider: Treating Provider/Extender: Valentina Gu Weeks in Treatment: 69 Constitutional Patient is hypotensive.. Pulse regular and within target range for patient.Marland Kitchen Respirations regular, non-labored and within target range.. Temperature is normal and within the target range for the patient.Marland Kitchen Appears in no distress. Notes Wound exam; the right medial lower leg left medial and left posterior. All of these have fibrinous gritty surfaces and macerated skin around the edges. I did not attempt debridement there is simply too much lymphedema. Also noteworthy that he has pitting edema up into his upper thigh area. Elevated jugular venous pressure as previously noted Electronic Signature(s) Signed: 04/23/2023 4:45:28 AM By: Baltazar Najjar MD Entered By: Baltazar Najjar on 04/22/2023 16:25:06 [name], [name] ([id number]) [id number] Page 5 of 14 -------------------------------------------------------------------------------- Physician Orders Details Patient Name: Date of Service: [name], [name] 04/22/2023 3:00 PM Medical Record Number: [id number] Patient Account Number: [id number] Date of Birth/Sex: Treating RN: [birth year] (77 y.o. Marlan Palau Primary Care Provider: [name] Other Clinician: Referring Provider: Treating Provider/Extender: Valentina Gu Weeks in Treatment: 9 The following information was scribed by: Samuella Bruin The information was scribed for: Baltazar Najjar Verbal / Phone Orders: No Diagnosis Coding Follow-up Appointments ppointment in 1 week. - Dr. Leanord Hawking Return A Other: - take your fluid pills as prescribed. Speak with cardiologist about increasing  fluid pills Anesthetic (In clinic) Topical Lidocaine 4% applied to wound bed Bathing/ Shower/ Hygiene May shower with protection but do not get wound dressing(s) wet. Protect dressing(s) with water repellant cover (for example, large plastic bag) or a cast cover and may then take shower. Edema Control - Orders / Instructions Segmental Compressive Device. Use the Segmental Compressive Device on leg(s) 2-3 times a day for 45 - 60 minutes. If wearing any wraps or hose, do not remove them. Continue exercising as instructed. - x3 a day Elevate legs to the level of the heart or above for 30 minutes daily and/or when sitting for 3-4 times a day throughout the day. Avoid standing for long periods of time. Exercise regularly Off-Loading Other: - When sitting please keep your feet up, keep legs at Heart Level or above. Use pillows behind calf to for comfort. Additional Orders / Instructions Follow Nutritious Diet - Try and increase protein intake. Goal of protein intake is 60g-100g. Lymphedema Treatment Plan - Exercise, Compression and Elevation Bilateral Lower Extremities Exercise daily as tolerated. (Walking, ROM, Calf Pumps and Toe Taps) Elevate legs 30 - 60 minutes at or above heart level at least 3 - 4 times daily as able/tolerated Avoid standing for long periods and elevate leg(s) parallel to the floor when sitting Use Pneumatic Compression Device on leg(s) 2-3 times a day for 45-60 minutes - x3 a day Wound Treatment Wound #12 - Lower Leg Wound Laterality: Left, Anterior Cleanser: Soap and Water 1 x Per Week/30 Days Discharge Instructions: May shower and wash wound with dial antibacterial soap and water prior to dressing change.  Peri-Wound Care: Sween Lotion (Moisturizing lotion) 1 x Per Week/30 Days Discharge Instructions: Apply moisturizing lotion as directed Prim Dressing: Maxorb Extra CMC/Alginate Dressing, 4x4 (in/in) 1 x Per Week/30 Days ary Discharge Instructions: Apply to wound bed  as instructed Secondary Dressing: ABD Pad, 8x10 1 x Per Week/30 Days Discharge Instructions: Apply over primary dressing as directed. Secondary Dressing: Zetuvit Plus 4x8 in 1 x Per Week/30 Days Discharge Instructions: Apply over primary dressing as directed. Secondary Dressing: OptiLock Super Absorbent, 5x5.5 (in/in) (Generic) 1 x Per Week/30 Days Discharge Instructions: Apply directly to wound bed as directed Compression Wrap: Urgo K2, (equivalent to a 4 layer) two layer compression system, regular 1 x Per Week/30 Days Discharge Instructions: Apply Urgo K2 as directed (alternative to 4 layer compression). Wound #15 - Lower Leg Wound Laterality: Left, Posterior, Distal Cleanser: Soap and Water 1 x Per Week/30 Days Discharge Instructions: May shower and wash wound with dial antibacterial soap and water prior to dressing change. [place], [name] ([id number]) [id number] Page 6 of 14 Peri-Wound Care: Sween Lotion (Moisturizing lotion) 1 x Per Week/30 Days Discharge Instructions: Apply moisturizing lotion as directed Prim Dressing: Maxorb Extra CMC/Alginate Dressing, 4x4 (in/in) 1 x Per Week/30 Days ary Discharge Instructions: Apply to wound bed as instructed Secondary Dressing: ABD Pad, 8x10 1 x Per Week/30 Days Discharge Instructions: Apply over primary dressing as directed. Secondary Dressing: Zetuvit Plus 4x8 in 1 x Per Week/30 Days Discharge Instructions: Apply over primary dressing as directed. Secondary Dressing: OptiLock Super Absorbent, 5x5.5 (in/in) (Generic) 1 x Per Week/30 Days Discharge Instructions: Apply directly to wound bed as directed Compression Wrap: Urgo K2, (equivalent to a 4 layer) two layer compression system, regular 1 x Per Week/30 Days Discharge Instructions: Apply Urgo K2 as directed (alternative to 4 layer compression). Wound #16 - Lower Leg Wound Laterality: Left, Lateral Cleanser: Soap and Water 1 x Per Week/30 Days Discharge  Instructions: May shower and wash wound with dial antibacterial soap and water prior to dressing change. Peri-Wound Care: Sween Lotion (Moisturizing lotion) 1 x Per Week/30 Days Discharge Instructions: Apply moisturizing lotion as directed Prim Dressing: Maxorb Extra CMC/Alginate Dressing, 4x4 (in/in) 1 x Per Week/30 Days ary Discharge Instructions: Apply to wound bed as instructed Secondary Dressing: ABD Pad, 8x10 1 x Per Week/30 Days Discharge Instructions: Apply over primary dressing as directed. Secondary Dressing: Zetuvit Plus 4x8 in 1 x Per Week/30 Days Discharge Instructions: Apply over primary dressing as directed. Secondary Dressing: OptiLock Super Absorbent, 5x5.5 (in/in) (Generic) 1 x Per Week/30 Days Discharge Instructions: Apply directly to wound bed as directed Compression Wrap: Urgo K2, (equivalent to a 4 layer) two layer compression system, regular 1 x Per Week/30 Days Discharge Instructions: Apply Urgo K2 as directed (alternative to 4 layer compression). Wound #6 - Lower Leg Wound Laterality: Right, Medial Cleanser: Soap and Water 1 x Per Week/30 Days Discharge Instructions: May shower and wash wound with dial antibacterial soap and water prior to dressing change. Peri-Wound Care: Sween Lotion (Moisturizing lotion) 1 x Per Week/30 Days Discharge Instructions: Apply moisturizing lotion as directed Prim Dressing: Maxorb Extra CMC/Alginate Dressing, 4x4 (in/in) 1 x Per Week/30 Days ary Discharge Instructions: Apply to wound bed as instructed Secondary Dressing: ABD Pad, 8x10 1 x Per Week/30 Days Discharge Instructions: Apply over primary dressing as directed. Secondary Dressing: Zetuvit Plus 4x8 in 1 x Per Week/30 Days Discharge Instructions: Apply over primary dressing as directed. Secondary Dressing: OptiLock Super Absorbent, 5x5.5 (in/in) (Generic) 1 x Per Week/30 Days Discharge Instructions: Apply directly  to wound bed as directed Compression Wrap: Urgo K2, (equivalent  to a 4 layer) two layer compression system, regular 1 x Per Week/30 Days Discharge Instructions: Apply Urgo K2 as directed (alternative to 4 layer compression). Wound #7 - Lower Leg Wound Laterality: Left, Medial Cleanser: Soap and Water 1 x Per Week/30 Days Discharge Instructions: May shower and wash wound with dial antibacterial soap and water prior to dressing change. Peri-Wound Care: Sween Lotion (Moisturizing lotion) 1 x Per Week/30 Days Discharge Instructions: Apply moisturizing lotion as directed Prim Dressing: Maxorb Extra CMC/Alginate Dressing, 4x4 (in/in) 1 x Per Week/30 Days ary Discharge Instructions: Apply to wound bed as instructed Secondary Dressing: ABD Pad, 8x10 1 x Per Week/30 Days Discharge Instructions: Apply over primary dressing as directed. [place], [name] ([id number]) [id number] Page 7 of 14 Secondary Dressing: Zetuvit Plus 4x8 in 1 x Per Week/30 Days Discharge Instructions: Apply over primary dressing as directed. Secondary Dressing: OptiLock Super Absorbent, 5x5.5 (in/in) (Generic) 1 x Per Week/30 Days Discharge Instructions: Apply directly to wound bed as directed Compression Wrap: Urgo K2, (equivalent to a 4 layer) two layer compression system, regular 1 x Per Week/30 Days Discharge Instructions: Apply Urgo K2 as directed (alternative to 4 layer compression). Patient Medications llergies: cephalexin, mometasone furoate, doxycycline monohydrate A Notifications Medication Indication Start End 04/22/2023 lidocaine DOSE topical 4 % cream - cream topical Electronic Signature(s) Signed: 04/22/2023 4:04:24 PM By: Samuella Bruin Signed: 04/23/2023 4:45:28 AM By: Baltazar Najjar MD Entered By: Samuella Bruin on 04/22/2023 15:38:25 -------------------------------------------------------------------------------- Problem List Details Patient Name: Date of Service: [place], [name] 04/22/2023 3:00 PM Medical Record Number:  [id number] Patient Account Number: [id number] Date of Birth/Sex: Treating RN: 1945/12/13 (77 y.o. M) Primary Care Provider: [name] Other Clinician: Referring Provider: Treating Provider/Extender: Valentina Gu Weeks in Treatment: 15 Active Problems ICD-10 Encounter Code Description Active Date MDM Diagnosis I87.333 Chronic venous hypertension (idiopathic) with ulcer and inflammation of 06/11/2022 No Yes bilateral lower extremity I89.0 Lymphedema, not elsewhere classified 03/26/2022 No Yes L97.828 Non-pressure chronic ulcer of other part of left lower leg with other specified 03/26/2022 No Yes severity L97.818 Non-pressure chronic ulcer of other part of right lower leg with other specified 03/26/2022 No Yes severity Inactive Problems ICD-10 Code Description Active Date Inactive Date L84 Corns and callosities 10/15/2022 10/15/2022 Bonet, [name] ([id number]) [id number] Page 8 of 14 Resolved Problems Electronic Signature(s) Signed: 04/23/2023 4:45:28 AM By: Baltazar Najjar MD Entered By: Baltazar Najjar on 04/22/2023 16:17:24 -------------------------------------------------------------------------------- Progress Note Details Patient Name: Date of Service: [name], [name] 04/22/2023 3:00 PM Medical Record Number: [id number] Patient Account Number: [id number] Date of Birth/Sex: Treating RN: [birth year] (77 y.o. M) Primary Care Provider: [name] Other Clinician: Referring Provider: Treating Provider/Extender: Valentina Gu Weeks in Treatment: 45 Subjective History of Present Illness (HPI) ADMISSION 02/23/2020 Patient is a 77 year old man who lives in [place] who arrives accompanied by his wife. He has a history of chronic lymphedema and venous insufficiency in his bilateral lower legs which may have something to do that with having a history of DVT as well as being treated for prostate cancer. In any  case he recently got compression pumps at home but compliance has been an issue here. He has compression stockings however they are probably not sufficient enough to control swelling. They tell us that things deteriorated for him in late August he was admitted to [facility] for 7 days. This was with cellulitis I think of his bilateral lower legs. Discharge he  was noted to have wounds on his bilateral lower legs. He was discharged on Bactrim. They tried to get him home health through [facility] of course they declined him. His wife is been wrapping these applying some form of silver foam dressing. He has a history of wounds before although nothing that would not heal with basic home topical dressings. He has 2 areas on the left medial, left anterior and left lateral and a smaller area on the right medial. All of these have considerable depth. Past medical history includes iron deficiency anemia, lymphedema followed by the rehab center at [facility] with lymphedema wraps I believe, DVT on chronic anticoagulation, prostate cancer, chronic venous insufficiency, hypertension. As mentioned he has compression pumps but does not use them. ABIs in our clinic were noncompressible bilaterally 10/14; patient with severe bilateral lymphedema right greater than left. He came in with bilateral lower extremity wounds left greater than right. Even though the right side has more of the edema most of the wounds here almost closed on the right medial. He has 3 remaining wounds on the left We have been using silver alginate under 4-layer compression I have been trying to get him to be compliant with his external compression pumps 10/21; patient with 3 small wounds on the left leg and 1 on the right medial in the setting of severe lymphedema and chronic venous insufficiency. We have been using silver alginate under 4-layer compression he is using his external compression pumps twice a  day 11/4; ARTERIAL STUDIES on the right show an ABI of 1.02 TBI of 0.858 with biphasic waveforms on the left 0.98 with a TBI of 0.55 and biphasic waveforms. Does not look like he has significant arterial disease. We are treating him for lymphedema he has compression pumps. He has punched-out areas on the left anterior left lateral and right medial lower extremities 11/11; after we obtained his arterial studies I put him in 4 layer compression. He is using his compression pumps probably once a day although I have asked him to do twice. Primary dressing to the wound is silver collagen he has severe lymphedema likely secondary to chronic venous insufficiency. Wounds on the left lateral, left medial and left anterior and a small area on the right medial 12/2; the area on the right anterior lower leg has healed. We initially thought that the area medially had healed as well however when her discharge nurse came in she detected fluid in the wound simply opened up. This is actually worse than I remember this pain. The area on the left lateral potentially slightly smaller He is also complaining about pain in his left hand he says that this is actually been getting some better he has been using topical creams on this. She asked that I look at this 12/9 after last weeks issues we have 2 wounds one on the right medial lower leg and 1 on the left lateral. Both of these are in the same condition. I think because of thickened skin secondary to chronic lymphedema these wounds actually have depth of almost 0.8 cm. 12/16; the patient has 2 small but deep wounds one on the right medial and one on the left lateral. The right medial is actually the worst of these. He arrives in clinic today with absolutely terrible edema in the right leg apparently his 4-layer wrap fell down to just above his ankle he did not think about this he is apparently been continuing to use  his compression pump twice a day. The left leg looks a  lot better. 05/09/2020 upon evaluation today patient appears to be doing decently well in regard to his wounds. Everything is measuring smaller the right leg still has a little bit deeper wound in the left seems to be almost completely healed in my opinion I am very pleased in general with how things are progressing. He has a 4- layer compression wrap we have been using endoform today we will probably have to use collagen just based on the fact that we do not have endoform it is on order. 1/6; the patient's wound on the left lateral lower leg has healed. Still has 1 on the right medial. 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 Using compression pumps at home twice a day. 1/13; left lateral lower leg is still healed. He has a deep punched out rectangular shaped wound on the right medial calf. Looking down at this it appears that he is attempting to epithelialize around the edges of the wound and on the base as well. His edema is reasonably well controlled we have been using collagen with absolutely no effect 1/20; left lateral lower leg remains closed he has extremitease stockings. The area on the right medial calf I aggressively debrided last week measures larger but the surface looks better. We have been using Hydrofera Blue. We ran [facility] through his insurance but we have not seen the results of this 1/27; left lower leg wound with chronic venous insufficiency and secondary lymphedema. I did aggressive debridement on this last week the wound seems to have come in healthy looking surface using Hydrofera Blue. He was denied for Oasis 2/3; small divot in the right medial lower leg. Under illumination the walls of this divot are epithelialized however the base has slough which I removed with a Scarpino, [name] ([id number]) [id number] Page 9 of 14 curette we have been using Hydrofera Blue 2/10 small divot on the right medial lower leg pinpoint illumination  at the base of this cone-shaped wound. We have been using Hydrofera Blue but I will switch to calcium alginate this week 2/17; the small divot on the right medial lower leg is fully epithelialized. There is no visible open area under illumination. He has his own stocking for the right leg similar to the one he has been wearing on the left. 03/26/2022; READMISSION This is a now 77 year old man that we had in the clinic from 02/23/2020 through 07/05/2020. At that point he had bilateral lower extremity wounds left greater than right in the setting of severe lymphedema. He had already obtained compression pumps ordered for him I think from the wound care clinic in [facility] so I do not really have record of what he has been using. He claims to be using them once a day but there is a problem with the sleeve on the left leg. About 2 weeks ago he was hospitalized from 03/11/2022 through 03/14/2022 with diastolic congestive heart failure. 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 He was diuresed. Developed some prerenal azotemia and he has not been taking any diuretics currently. He has not been putting stockings on his legs since he got out of hospital and still has his legs dependent for long periods. Past medical history history of prostate cancer treated with prostatectomy and radiation this was apparently about 8 years ago, history of DVT on chronic Coumadin, history of lymphedema was managed for a  while at the clinic in [place]. History of inguinal hernia repair in September 22, hypertension, stage IIIb chronic renal failure ABIs today were noncompressible on the right 1.12 on the left 04-02-2022 upon evaluation today patient appears to be doing well currently in regard to his legs I do feel like both areas that are draining are actually much drier than they were in the picture last week although the left is drier than the right. He is  tolerating the 4-layer compression wraps at this point he did contact the pump company and they are actually working on getting him a new compression sleeve for one of his legs which have previously popped and was not functioning properly. 04-23-2022 upon evaluation today patient appears to be doing well currently in regard to his wounds on the legs. I am actually very pleased with where things stand and I do feel like that we are headed in the right direction. Fortunately there is no sign of active infection locally or systemically at this time. 05-07-2022 upon evaluation today patient appears to be doing well currently in regard to his wounds in fact things are showing signs of improvement which is good news I do not see too much that actually appears to be open and I am very pleased in that regard. No fevers, chills, nausea, vomiting, or diarrhea. 05-21-2022 upon evaluation today patient appears to be doing somewhat poorly in regard to drainage of his lower extremities bilaterally. The right is greater than left as far as the weeping area. Nonetheless it seems to be getting worse not better. He actually has pitting edema which is at least 2+ to the thighs and I am concerned about the fact that he is may be fluid overloaded in general and that is the reason why we cannot get this under control. I know he is not using his pumps all the time because he actually told the nurse that he was either going to pump or he was going to use his fluid pills but not do both. For that reason I do think that he needs to be really doing both in order to get the fluid out as effectively as possible obviously with the 4-layer compression wraps were doing as much as we can from a compression standpoint but it is really not enough. He tells me that he elevates his leg is much as he can in between pumping and other activity throughout the day. 05-28-2022 upon evaluation today patient appears to be doing better in regard to  his wounds although the measurements may be a little bit larger this is a very difficult wound to heal it is very indistinct in a lot of areas. Nonetheless there is can be some need for sharp debridement in regard to both medial and lateral legs. Fortunately I see no signs of active infection locally nor systemically at this time. No fevers, chills, nausea, vomiting, or diarrhea. 06-04-2022 upon evaluation today patient appears to be doing poorly in general in regard to the wounds on his legs. He still continues to have a tremendous amount of fluid not just in the lower portion of his leg but to be honest his thigh where he has 2-3+ pitting edema in the thigh as well. Unfortunately I do not know that we will be able to get this healed effectively and keep it healed on the lower extremities unless he gets the overall fluid situation taking and under control. Fortunately I do not see any signs of infection locally nor  systemically which is great news. He just seems to be very fluid overloaded. 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 He has been tolerating the dressing changes with the compression wraps without complication. Fortunately I do not see any evidence of infection at this time which is great news. No fevers, chills, nausea, vomiting, or diarrhea. 06-18-2022 upon evaluation today patient appears to be doing well currently in regard to his wounds as far as not looking like they are terribly infected but nonetheless I am concerned about a subacute infection secondary to the fact that he continues to have spreading despite the compression therapy. We actually did do an Unna boot on him last week this is actually the first wrap that actually stayed up everything else has been sliding down quite significantly. Fortunately there does not appear to be any signs of infection systemically at  this time. With that being said I do believe that locally there seems to be an issue going on here and again I Ernie Hew do a PCR culture to see what that shows also think that I am going to put him on a broad-spectrum antibiotic, doxycycline to see how that will help as well. He does tell me that coming into the clinic today that he was feeling short of breath like "he was about to have a heart attack" because he was having such a hard time breathing. He says that he told this to Dr. Jodelle Green his cardiologist as well when he was evaluated in the past 1 to 2 weeks. 07-02-2022 upon evaluation today patient appears to be doing poorly currently in regard to his wound. He has been tolerating the dressing changes. Unfortunately he has not had any compression wraps on for the past week because he was unable to make it in for his appointment last week. With that being said he has a significant amount of drainage he tells me has been using his pumps but despite this in the pumps he still has been draining quite a bit. The drainage is also somewhat purulent unfortunately. We did attempt to get in touch with his cardiologist last week unfortunately we were unable to get up with him I did advise that the patient needs to get in touch with him upon leaving today in order to make sure they know he is on the new antibiotics I am going to send him this will be Levaquin and Augmentin. 07-09-2022 upon evaluation today patient appears to be doing about the same in regard to his legs he may have just a slight amount of improvement with regard to the drainage probably Keystone topical antibiotics are helping in this regard to some degree. Fortunately there does not appear to be any signs of active infection systemically which is great news. No fevers, chills, nausea, vomiting, or diarrhea. 07-16-2022 upon evaluation today patient appears to be doing well currently in regard to his wound. He has been tolerating the dressing changes  without complication. Fortunately there does not appear to be any signs of active infection locally nor systemically at this time. With that being said he cannot keep the wraps up he tells me on the left side he had to cut this down because it got too tight. He has been using his pumps but he is on the right side the wrap actually straight down causing some pushing around the central part of his leg just below the calf I think this is  a bigger risk for him that help at this point. I think that we may need to try something different he should be getting his compression socks shortly he tells me they were ordered last Thursday. 3/6; ; this is a patient who lives in [place]. He has severe bilateral lymphedema. He has compression pumps, we have been using kerlix Ace wrap Keystone. He is changing the dressing. We do not have home health. 08-06-2022 upon evaluation today patient appears to be doing a little better in regard to his wounds in general at this point. Fortunately there does not appear to be any signs of active infection locally nor systemically at this time which is great news and overall I am extremely pleased with where we stand today. 08-13-2022 upon evaluation today patient appears to actually be doing significantly better compared to last week. He actually did go to the hospital I told him that he needed to when he left here and he actually did go. With that being said they actually ended up admitting him he was having shortness of breath and I thought it might be related to congestive heart failure turns out he actually had a pulmonary embolism. Subsequently they were able to get him off of the Coumadin switching over to Eliquis to get things stabilized in that regard they also had them wrapped and got his swelling under control on his legs he actually [name], [name] ([id number]) [phone].pdf Page 10 of 14 looks much better pretty much across the board at this  point. I am very pleased in that regard. With that being said I am very happy that he finally went that could have been a very dangerous situation. 08-20-2022 upon evaluation today patient appears to be doing well currently in regard to his wound. Has been tolerating the dressing changes without complication. Fortunately there does not appear to be any signs of active infection locally nor systemically at this time. I think his legs are doing better there is some need for sharp debridement today. 08-27-2022 upon evaluation patient is actually making excellent progress. I am actually very pleased with where he stands and I think that he is moving in the right direction. In general I think that we are looking pretty good at the moment. 09-03-2022 upon evaluation today patient appears to be doing well currently in regard to his wound. He is actually tolerating dressing changes on the left and right leg without complication. Fortunately I do not see any need for debridement of the left leg the right leg I think we probably do some need to perform some debridement here. 09-10-2022 upon evaluation today patient's wounds actually showed signs of improvement in both legs I do not see much is going require debridement today which was great news. Fortunately I do not see any evidence of infection which I think is also excellent he seems to be using his pumps and doing everything right I am happy about how this is progressing at this point. 09-17-2022 upon evaluation today patient appears to be doing decently well in regard to his wounds. He has been tolerating the dressing changes without complication. Fortunately there does not appear to be any signs of active infection at this time which is good news. 09-24-2022 upon evaluation today patient appears to be doing well currently in regard to his wounds. He has been tolerating the dressing changes without complication. Fortunately there does not appear to be any signs of  active infection locally nor systemically which is great news. No  fevers, chills, nausea, vomiting, or diarrhea. 10-08-2022 upon evaluation today patient appears to be doing excellent currently in regard to his wound. He has been tolerating the dressing changes without complication though it does not sound like he has been using his compression wraps for a bit here. He does think he was doing better with the [facility] topical antibiotics we can definitely go back to using that but I think the biggest issue here is that his swelling is just very out of control and needs to be under control. I discussed that with him today. 10-15-2022 upon evaluation today patient appears to be doing well currently in regard to his wounds. He is actually making some progress which is good news. Fortunately I do not see any evidence of active infection locally nor systemically which is great news as well. No fevers, chills, nausea, vomiting, or diarrhea. He does have a callused area on the plantar aspect of his left foot which is actually causing some pain and he wonders if I can trim this down for him. 10-22-2022 upon evaluation today patient seems to be making progress. He is actually doing quite well and very pleased in that regard. I do not see any signs of active infection at this time. 11-05-2022 upon evaluation patient appears to be making progress although slowly towards closure. He seems to be doing well with regard to his legs were still using the [facility] topical antibiotics and he seems to be doing quite well. He is going require some sharp debridement today. 11-19-2022 upon evaluation today patient unfortunately appears to be extremely swollen at this point. He tells me that he ran out of supplies he also tells me his leg started leaking more because of not having supplies he was unable to wear his compression wraps that is the juxta fit compression wraps. Therefore his legs are extremely swollen much larger than  normal and do not appear to be doing well at all today. He is going require some debridement I also think he is going require [place] to perform compression wrapping today. 11-26-2022 upon evaluation today patient appears to be doing well currently in regard to his legs as far as infection is concerned I see nothing that appears to be infected. Fortunately I do not see any signs of active infection systemically either which is also good news. With that being said he still is extremely swollen as far as his legs are concerned. I do not see any signs of overall worsening but also do not see any signs of significant improvement which is the major issue here. 12-03-2022 upon evaluation today patient appears to be doing poorly still in regard to his legs the apparently has been taken off the wraps and not leaving the week by week. With that being said he has not had really the dressings to put on the release that is running out and subsequently though he is using his wraps I am not sure that he has been keeping them on like he needs to. I explained to him I really wanted to have the wraps that I put on him left on until he comes back to see me so that we can keep the compression under better control. He voiced understanding today he tells me that he was "confused about that". Nonetheless based on what I am seeing right now also think that he is up on his feet too much I think he needs to get the feet elevated. 7/24; this is a patient with severe  bilateral stage III lymphedema. On the right lower leg medially is a large area of macerated denuded skin was too small deeper areas in this. On the left leg he is 2 areas that are a little more standard in terms of wounds. Massive lymphedema bilaterally. He has compression pumps at home which he uses for 1 hour twice a day 12-17-2022 upon evaluation today patient's legs though a little bit smaller still continue to have significant issues with weeping here this just does  not dry. I really think he needs to be changing this daily and this means we will probably have to try to go back to the Velcro wraps and give this a shot. I think may be going to the Velcro wraps, changing the dressing to a superabsorber like XtraSorb or the like, and have him elevate his legs and do his pumps 3 times per day is probably going to be the way to go to try to see if we can make some improvement here. 12-24-2022 upon evaluation today patient unfortunately is still struggling to get anything under control. We have been extremely aggressive and trying to: Control his swelling everything we do however seems to be met with resistance. He tells me that he is pumping 3 times per day with his lymphedema pumps. He also tells me that he is try to elevate his legs is much as possible and that subsequently he has been keeping his compression wraps on. He goes back and forth between saying that he can keep our wraps on and not but right now I really do not see that he is doing well with the juxta fit is at least not from the standpoint of improving the overall appearance I think it does to some degree help maintain but we are still struggling here. I have voiced a concern to the patient that despite everything you are doing I am still not seeing a lot of improvement here and to be honest I am not sure what is can take get this under control he may have to go into the hospital for admission. 12-31-2022 upon evaluation today patient appears to be doing well currently in regard to his wounds. They seem a little better although he has several pustules around the legs in general but has been concerned about infection. I am actually going to go ahead and see about getting an antibiotic sent into the pharmacy today although I am going to obtain a PCR culture as well. 01-07-2023 upon evaluation today patient appears to be doing well currently in regard to his wound. Has been tolerating the dressing changes  without complication. He actually seems to be doing much better. He did have a PCR culture which showed Serratia as well and the prominent organisms here. Levaquin was actually a good option to treat the Serratia along with the other organisms that were problematic including E. coli. Nonetheless I think coupled with the topical Keystone antibiotics and the alginate he is really doing quite well today. 8/28; patient with severe bilateral lymphedema he has wounds bilaterally on his lower legs but they look a lot better today. We are using silver alginate and Keystone with Urgo K2 lite wraps. He comes in today with his wounds looking quite good. He was prescribed Levaquin last time I think he will complete this in a few days. 01-21-2023 upon evaluation today patient appears to be doing worse in regard to his swelling took his wrap off he tells me he believes on Saturday. With  that being said his leg is significantly swollen since that time and unfortunately does not appear to be doing nearly as good as it was last time I saw him 2 weeks ago or even last week for that matter. I am very concerned about this. 01-28-2023 upon evaluation today patient appears to be doing poorly currently in regard to his legs. He has been doing everything he says that we have recommended, he continues to use his lymphedema pumps 3 times a day, he has been doubling up on his fluid pills, he has been elevating his legs, and he Gaetano, [name] ([id number]) [phone].pdf Page 11 of 14 tells me that he also has kept the wraps on and in fact he did have them on the day when he came in. Again that for feels the compression side of things. Nonetheless with everything going on here we still have not been able to get this completely under control. He continues to have issues with significant swelling of lower extremities and this is not limited to just his lower left portion of his legs but his thighs are fairly  large as well. With that being said I really feel like that we have reached the extent of what we have to offer here at the wound care center I am wondering if we can see about making a referral for second opinion to [facility] to see if there is anything they could do to help him out at this point. It is really not much further from his home to there as it is from his home to here. Both are about an hour distance. He lives in [place]. 02-04-2023 upon evaluation today patient appears to be doing much better actually compared to last week's evaluation. This is actually significant improvement and overall I do not know exactly what he did different other than he tells me that he "has been taking his fluid pills every day instead of every other day. Obviously this is needed. I was under the understanding previously that he was taking this every day but I think he may also not been pumping every day like he was telling me but I cannot be sure this either way he does seem to be doing what he supposed to do at this point. 02-11-2023 upon evaluation today patient unfortunately appears to be about as bad today as he appeared good last week. I am not sure what happened other than the fact he did take his wrap off the right leg he tells me it got so tight that he could not feel his toes they are getting very numb. Subsequently he tells me that it was difficulty with driving therefore he took it off. With that being said I do believe that he may need to go to the ER for evaluation and treatment I feel he may need to be admitted to get some of the fluid off of his legs. I think that he is volume overloaded and at this point I do not think there is much working to be able to do to manage this outpatient we have tried pretty much everything that I am going to do. 02-18-23 upon evaluation today patient appears to be doing pretty well currently compared to last week this is actually better today. He is less  weeping and the legs are actually significantly smaller. He tells me has been trying to stay in bed with his legs elevated more. 02-25-2023 upon evaluation today patient appears to be doing well currently  in regard to his legs in general compared to where we have been. Fortunately I do not see any signs of active infection locally or systemically which is great news and in general I do believe that we are making good headway here towards complete closure. I am hopeful that he will continue to show signs of improvement as we progress going forward. He does seem to have been taking his fluid pills as well as elevating his legs and using lymphedema pumps on a regular basis. He is trying to stay out of the hospital. 03-04-2023 upon evaluation today patient appears to be doing well currently in regard to his wound. He has been tolerating the dressing changes without complication. Fortunately there does not appear to be any signs of active infection at this time. 03-11-2023 upon evaluation today patient appears to be doing well currently in regard to his wounds. He has been tolerating the dressing changes without complication. Fortunately I do not see any signs of active infection at this time which is great news. 11/6; patient missed his clinic appointment last week. He took his wraps off at some point. Did not take his diuretics because he was nauseated last week. He comes in with massive bilateral edema which is lymphedema and probably some degree of systemic fluid overload. 11/13; patient with very poorly controlled lymphedema. We have been putting 4-layer equivalent wraps on him he says he is using his compression pumps twice a day. And elevating his legs nevertheless the edema control today is very poor. He has a substantial wound on the right medial lower leg also of the left medial and left posterior. We have been using alginate as a primary dressing or Aquacel Ag 11/20; some improvement in the  amount of edema. We have been using 4-layer equivalent wraps and he is pumping with his external compression pumps twice a day. His legs had improved in terms of edema although there is still massively edematous. He tells me that he is on torsemide 1-1/2 tablets 20 mg tabs a day 12/4; massive edema extending up into his groin. Interestingly his edema in the thighs is more pitting then nonpitting lymphedema. His wounds are on both legs. This is not something that I think we can debride although I did do this last week I am not sure this did him any good. We have been using 4-layer compression he is using his pumps twice a day. I misunderstood his torsemide dosage which apparently is 1 tablet alternating with a half a tablet daily. I think he probably needs more than that. Unfortunately he cannot get a hold of his cardiologist... Objective Constitutional Patient is hypotensive.. Pulse regular and within target range for patient.Marland Kitchen Respirations regular, non-labored and within target range.. Temperature is normal and within the target range for the patient.Marland Kitchen Appears in no distress. Vitals Time Taken: 3:28 PM, Height: 74 in, Weight: 250 lbs, BMI: 32.1, Temperature: 97.8 F, Pulse: 99 bpm, Respiratory Rate: 18 breaths/min, Blood Pressure: 169/73 mmHg. General Notes: Wound exam; the right medial lower leg left medial and left posterior. All of these have fibrinous gritty surfaces and macerated skin around the edges. I did not attempt debridement there is simply too much lymphedema. Also noteworthy that he has pitting edema up into his upper thigh area. Elevated jugular venous pressure as previously noted Integumentary (Hair, Skin) Wound #12 status is Open. Original cause of wound was Frostbite. The date acquired was: 12/17/2022. The wound has been in treatment 18 weeks. The wound is  located on the Left,Anterior Lower Leg. The wound measures 2.5cm length x 4.7cm width x 0.1cm depth; 9.228cm^2 area and  0.923cm^3 volume. There is Fat Layer (Subcutaneous Tissue) exposed. There is no tunneling or undermining noted. There is a large amount of serous drainage noted. The wound margin is distinct with the outline attached to the wound base. There is small (1-33%) red, pink granulation within the wound bed. There is a large (67-100%) amount of necrotic tissue within the wound bed including Adherent Slough. The periwound skin appearance exhibited: Hemosiderin Staining. The periwound skin appearance did not exhibit: Callus, Crepitus, Excoriation, Induration, Rash, Scarring, Dry/Scaly, Maceration, Atrophie Blanche, Cyanosis, Ecchymosis, Mottled, Pallor, Rubor, Erythema. Periwound temperature was noted as No Abnormality. Wound #15 status is Open. Original cause of wound was Gradually Appeared. The date acquired was: 01/14/2023. The wound has been in treatment 9 weeks. The wound is located on the Left,Distal,Posterior Lower Leg. The wound measures 3.2cm length x 4.5cm width x 0.1cm depth; 11.31cm^2 area and 1.131cm^3 volume. There is Fat Layer (Subcutaneous Tissue) exposed. There is no tunneling or undermining noted. There is a medium amount of serosanguineous drainage noted. The wound margin is distinct with the outline attached to the wound base. There is small (1-33%) pink granulation within the wound bed. There is a large (67-100%) amount of necrotic tissue within the wound bed including Adherent Slough. The periwound skin appearance exhibited: Hemosiderin Staining. The periwound skin appearance did not exhibit: Callus, Crepitus, Excoriation, Induration, Rash, Scarring, Dry/Scaly, Maceration, Atrophie Blanche, Cyanosis, Ecchymosis, Mottled, Pallor, Rubor, Erythema. Wound #16 status is Open. Original cause of wound was Blister. The date acquired was: 04/01/2023. The wound has been in treatment 3 weeks. The wound is located on the Left,Lateral Lower Leg. The wound measures 5cm length x 5.5cm width x 0.1cm depth;  21.598cm^2 area and 2.16cm^3 volume. There is Fat Layer (Subcutaneous Tissue) exposed. There is no tunneling or undermining noted. There is a medium amount of serous drainage noted. The wound margin is Harnois, [name] ([id number]) [id number] Page 12 of 14 distinct with the outline attached to the wound base. There is small (1-33%) red granulation within the wound bed. There is a large (67-100%) amount of necrotic tissue within the wound bed including Adherent Slough. The periwound skin appearance had no abnormalities noted for texture. The periwound skin appearance had no abnormalities noted for moisture. The periwound skin appearance exhibited: Hemosiderin Staining. The periwound skin appearance did not exhibit: Atrophie Blanche, Cyanosis, Ecchymosis, Mottled, Pallor, Rubor, Erythema. Periwound temperature was noted as No Abnormality. Wound #6 status is Open. Original cause of wound was Gradually Appeared. The date acquired was: 03/05/2022. The wound has been in treatment 56 weeks. The wound is located on the Right,Medial Lower Leg. The wound measures 12.5cm length x 8cm width x 0.3cm depth; 78.54cm^2 area and 23.562cm^3 volume. There is Fat Layer (Subcutaneous Tissue) exposed. There is no tunneling or undermining noted. There is a large amount of serous drainage noted. The wound margin is distinct with the outline attached to the wound base. There is small (1-33%) pink granulation within the wound bed. There is a large (67-100%) amount of necrotic tissue within the wound bed including Adherent Slough. The periwound skin appearance exhibited: Scarring, Hemosiderin Staining. The periwound skin appearance did not exhibit: Callus, Crepitus, Excoriation, Induration, Rash, Dry/Scaly, Maceration, Atrophie Blanche, Cyanosis, Ecchymosis, Mottled, Pallor, Rubor, Erythema. Periwound temperature was noted as No Abnormality. Wound #7 status is Open. Original cause of wound was  Gradually Appeared. The date acquired was:  03/05/2022. The wound has been in treatment 56 weeks. The wound is located on the Left,Medial Lower Leg. The wound measures 4.5cm length x 4.5cm width x 0.1cm depth; 15.904cm^2 area and 1.59cm^3 volume. There is Fat Layer (Subcutaneous Tissue) exposed. There is no tunneling or undermining noted. There is a large amount of serous drainage noted. The wound margin is distinct with the outline attached to the wound base. There is small (1-33%) red granulation within the wound bed. There is a large (67-100%) amount of necrotic tissue within the wound bed including Adherent Slough. The periwound skin appearance exhibited: Scarring, Hemosiderin Staining. The periwound skin appearance did not exhibit: Callus, Crepitus, Excoriation, Induration, Rash, Dry/Scaly, Maceration, Atrophie Blanche, Cyanosis, Ecchymosis, Mottled, Pallor, Rubor, Erythema. Periwound temperature was noted as No Abnormality. 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 There was a Double Layer Compression Therapy Procedure by Samuella Bruin, RN. Post procedure Diagnosis Wound #12: Same as Pre-Procedure Wound #6 Pre-procedure diagnosis of Wound #6 is a Lymphedema located on the Right,Medial Lower Leg . There was a Double Layer Compression Therapy Procedure by Samuella Bruin, RN. Post procedure Diagnosis Wound #6: Same as Pre-Procedure Plan Follow-up Appointments: Return Appointment in 1 week. - Dr. Leanord Hawking Other: - take your fluid pills as prescribed. Speak with  cardiologist about increasing fluid pills Anesthetic: (In clinic) Topical Lidocaine 4% applied to wound bed Bathing/ Shower/ Hygiene: May shower with protection but do not get wound dressing(s) wet. Protect dressing(s) with water repellant cover (for example, large plastic bag) or a cast cover and may then take shower. Edema Control - Orders / Instructions: Segmental Compressive Device. Use the Segmental Compressive Device on leg(s) 2-3 times a day for 45 - 60 minutes. If wearing any wraps or hose, do not remove them. Continue exercising as instructed. - x3 a day Elevate legs to the level of the heart or above for 30 minutes daily and/or when sitting for 3-4 times a day throughout the day. Avoid standing for long periods of time. Exercise regularly Off-Loading: Other: - When sitting please keep your feet up, keep legs at Heart Level or above. Use pillows behind calf to for comfort. Additional Orders / Instructions: Follow Nutritious Diet - Try and increase protein intake. Goal of protein intake is 60g-100g. Lymphedema Treatment Plan - Exercise, Compression and Elevation: Exercise daily as tolerated. (Walking, ROM, Calf Pumps and T T oe aps) Elevate legs 30 - 60 minutes at or above heart level at least 3 - 4 times daily as able/tolerated Avoid standing for long periods and elevate leg(s) parallel to the floor when sitting Use Pneumatic Compression Device on leg(s) 2-3 times a day for 45-60 minutes - x3 a day The following medication(s) was prescribed: lidocaine topical 4 % cream cream topical was prescribed at facility WOUND #12: - Lower Leg Wound Laterality: Left, Anterior Cleanser: Soap and Water 1 x Per Week/30 Days Discharge Instructions: May shower and wash wound with dial antibacterial soap and water prior to dressing change. Peri-Wound Care: Sween Lotion (Moisturizing lotion) 1 x Per Week/30 Days [name], [name] ([id number]) [id number] Page 13 of  14 Discharge Instructions: Apply moisturizing lotion as directed Prim Dressing: Maxorb  Extra CMC/Alginate Dressing, 4x4 (in/in) 1 x Per Week/30 Days ary Discharge Instructions: Apply to wound bed as instructed Secondary Dressing: ABD Pad, 8x10 1 x Per Week/30 Days Discharge Instructions: Apply over primary dressing as directed. Secondary Dressing: Zetuvit Plus 4x8 in 1 x Per Week/30 Days Discharge Instructions: Apply over primary dressing as directed. Secondary Dressing: OptiLock Super Absorbent, 5x5.5 (in/in) (Generic) 1 x Per Week/30 Days Discharge Instructions: Apply directly to wound bed as directed Com pression Wrap: Urgo K2, (equivalent to a 4 layer) two layer compression system, regular 1 x Per Week/30 Days Discharge Instructions: Apply Urgo K2 as directed (alternative to 4 layer compression). WOUND #15: - Lower Leg Wound Laterality: Left, Posterior, Distal Cleanser: Soap and Water 1 x Per Week/30 Days Discharge Instructions: May shower and wash wound with dial antibacterial soap and water prior to dressing change. Peri-Wound Care: Sween Lotion (Moisturizing lotion) 1 x Per Week/30 Days Discharge Instructions: Apply moisturizing lotion as directed Prim Dressing: Maxorb Extra CMC/Alginate Dressing, 4x4 (in/in) 1 x Per Week/30 Days ary Discharge Instructions: Apply to wound bed as instructed Secondary Dressing: ABD Pad, 8x10 1 x Per Week/30 Days Discharge Instructions: Apply over primary dressing as directed. Secondary Dressing: Zetuvit Plus 4x8 in 1 x Per Week/30 Days Discharge Instructions: Apply over primary dressing as directed. Secondary Dressing: OptiLock Super Absorbent, 5x5.5 (in/in) (Generic) 1 x Per Week/30 Days Discharge Instructions: Apply directly to wound bed as directed Com pression Wrap: Urgo K2, (equivalent to a 4 layer) two layer compression system, regular 1 x Per Week/30 Days Discharge Instructions: Apply Urgo K2 as directed (alternative to 4 layer  compression). WOUND #16: - Lower Leg Wound Laterality: Left, Lateral Cleanser: Soap and Water 1 x Per Week/30 Days Discharge Instructions: May shower and wash wound with dial antibacterial soap and water prior to dressing change. Peri-Wound Care: Sween Lotion (Moisturizing lotion) 1 x Per Week/30 Days Discharge Instructions: Apply moisturizing lotion as directed Prim Dressing: Maxorb Extra CMC/Alginate Dressing, 4x4 (in/in) 1 x Per Week/30 Days ary Discharge Instructions: Apply to wound bed as instructed Secondary Dressing: ABD Pad, 8x10 1 x Per Week/30 Days Discharge Instructions: Apply over primary dressing as directed. Secondary Dressing: Zetuvit Plus 4x8 in 1 x Per Week/30 Days Discharge Instructions: Apply over primary dressing as directed. Secondary Dressing: OptiLock Super Absorbent, 5x5.5 (in/in) (Generic) 1 x Per Week/30 Days Discharge Instructions: Apply directly to wound bed as directed Com pression Wrap: Urgo K2, (equivalent to a 4 layer) two layer compression system, regular 1 x Per Week/30 Days Discharge Instructions: Apply Urgo K2 as directed (alternative to 4 layer compression). WOUND #6: - Lower Leg Wound Laterality: Right, Medial Cleanser: Soap and Water 1 x Per Week/30 Days Discharge Instructions: May shower and wash wound with dial antibacterial soap and water prior to dressing change. Peri-Wound Care: Sween Lotion (Moisturizing lotion) 1 x Per Week/30 Days Discharge Instructions: Apply moisturizing lotion as directed Prim Dressing: Maxorb Extra CMC/Alginate Dressing, 4x4 (in/in) 1 x Per Week/30 Days ary Discharge Instructions: Apply to wound bed as instructed Secondary Dressing: ABD Pad, 8x10 1 x Per Week/30 Days Discharge Instructions: Apply over primary dressing as directed. Secondary Dressing: Zetuvit Plus 4x8 in 1 x Per Week/30 Days Discharge Instructions: Apply over primary dressing as directed. Secondary Dressing: OptiLock Super Absorbent, 5x5.5 (in/in)  (Generic) 1 x Per Week/30 Days Discharge Instructions: Apply directly to wound bed as directed Com pression Wrap: Urgo K2, (equivalent to a 4 layer) two layer compression system, regular 1 x Per Week/30 Days Discharge  Instructions: Apply Urgo K2 as directed (alternative to 4 layer compression). WOUND #7: - Lower Leg Wound Laterality: Left, Medial Cleanser: Soap and Water 1 x Per Week/30 Days Discharge Instructions: May shower and wash wound with dial antibacterial soap and water prior to dressing change. Peri-Wound Care: Sween Lotion (Moisturizing lotion) 1 x Per Week/30 Days Discharge Instructions: Apply moisturizing lotion as directed Prim Dressing: Maxorb Extra CMC/Alginate Dressing, 4x4 (in/in) 1 x Per Week/30 Days ary Discharge Instructions: Apply to wound bed as instructed Secondary Dressing: ABD Pad, 8x10 1 x Per Week/30 Days Discharge Instructions: Apply over primary dressing as directed. Secondary Dressing: Zetuvit Plus 4x8 in 1 x Per Week/30 Days Discharge Instructions: Apply over primary dressing as directed. Secondary Dressing: OptiLock Super Absorbent, 5x5.5 (in/in) (Generic) 1 x Per Week/30 Days Discharge Instructions: Apply directly to wound bed as directed Com pression Wrap: Urgo K2, (equivalent to a 4 layer) two layer compression system, regular 1 x Per Week/30 Days Discharge Instructions: Apply Urgo K2 as directed (alternative to 4 layer compression). 1. Continue with silver alginate as the primary dressing ABDs. The wound volume is simply too large for any other standard dressings. We are backing this withZetuvit, ABDs under Urgo K2 2. URGO K2 compression 3. Still using his compression pumps 4. I am looking forward to a cardiologist thoughts about increasing his torsemide. It seems he has increasing edema Electronic Signature(s) Signed: 04/23/2023 4:45:28 AM By: Baltazar Najjar MD Entered By: Baltazar Najjar on 04/22/2023 16:32:34 [name], [name] ([id number])  [id number] Page 14 of 14 -------------------------------------------------------------------------------- SuperBill Details Patient Name: Date of Service: Sweney, [name] Manuela Neptune 04/22/2023 Medical Record Number: [id number] Patient Account Number: [id number] Date of Birth/Sex: Treating RN: 1946/03/21 (77 y.o. Marlan Palau Primary Care Provider: [name] Other Clinician: Referring Provider: Treating Provider/Extender: Valentina Gu Weeks in Treatment: 56 Diagnosis Coding ICD-10 Codes Code Description [phone] Chronic venous hypertension (idiopathic) with ulcer and inflammation of bilateral lower extremity I89.0 Lymphedema, not elsewhere classified 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.818 Non-pressure chronic ulcer of other part of right lower leg with other specified severity Facility Procedures : CPT4: Code [id number] Description: 81 BILATERAL: Application of multi-layer venous compression system; leg (below knee), including ankle and t. ICD-10 Diagnosis Description L97.818 Non-pressure chronic ulcer of other part of right lower leg with other specified severity  L97.828 Non-pressure chronic ulcer of other part of left lower leg with other specified severity Modifier: Quantity: 1 Physician Procedures : CPT4 Code Description Modifier [id number] - WC PHYS LEVEL 3 - EST PT ICD-10 Diagnosis Description I89.0 Lymphedema, not elsewhere classified 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.818  Non-pressure chronic ulcer of other part of right lower leg with other specified severity Quantity: 1 Electronic Signature(s) Signed: 04/23/2023 4:45:28 AM By: Baltazar Najjar MD Previous Signature: 04/22/2023 4:04:24 PM Version By: Samuella Bruin Entered By: Baltazar Najjar on 04/22/2023 16:32:59

## 2023-04-29 ENCOUNTER — Encounter (HOSPITAL_BASED_OUTPATIENT_CLINIC_OR_DEPARTMENT_OTHER): Payer: Medicare Other | Admitting: Internal Medicine

## 2023-04-29 DIAGNOSIS — I87333 Chronic venous hypertension (idiopathic) with ulcer and inflammation of bilateral lower extremity: Secondary | ICD-10-CM | POA: Diagnosis not present

## 2023-04-29 NOTE — Progress Notes (Addendum)
[name] ([id number]) [id number] Page 1 of 16 Visit Report for 04/29/2023 Arrival Information Details Patient Name: Date of Service: [name], [name] 04/29/2023 3:00 PM Medical Record Number: [id number] Patient Account Number: [id number] Date of Birth/Sex: Treating RN: Jul 01, 1945 (77 y.o. Charlean Merl, [name] Primary Care Tameron Lama: [name] Other Clinician: Referring Chrislyn Seedorf: Treating Jaycion Treml/Extender: Valentina Gu Weeks in Treatment: 85 Visit Information History Since Last Visit Added or deleted any medications: No Patient Arrived: [name] Mor Any new allergies or adverse reactions: No Arrival Time: 15:53 Had a fall or experienced change in No Accompanied By: self activities of daily living that may affect Transfer Assistance: Manual risk of falls: Patient Identification Verified: Yes Signs or symptoms of abuse/neglect since last visito No Secondary Verification Process Completed: Yes Hospitalized since last visit: No Patient Requires Transmission-Based Precautions: No Implantable device outside of the clinic excluding No Patient Has Alerts: Yes cellular tissue based products placed in the center Patient Alerts: Patient on Blood Thinner since last visit: Right ABI in clinic [place] Has Dressing in Place as Prescribed: Yes Has Compression in Place as Prescribed: Yes Pain Present Now: No Electronic Signature(s) Signed: 04/29/2023 4:44:05 PM By: Fonnie Mu RN Entered By: Fonnie Mu on 04/29/2023 12:53:33 -------------------------------------------------------------------------------- Compression Therapy Details Patient Name: Date of Service: [name] 04/29/2023 3:00 PM Medical Record Number: [id number] Patient Account Number: [id number] Date of Birth/Sex: Treating RN: 10/18/1945 (77 y.o. Lucious Groves Primary Care [name]: [name] Other Clinician: Referring Dorma Altman: Treating  Gabi Mcfate/Extender: Valentina Gu Weeks in Treatment: 58 Compression Therapy Performed for Wound Assessment: Wound #12 Left,Anterior Lower Leg Performed By: Clinician Fonnie Mu, RN Compression Type: Four Layer Post Procedure Diagnosis Same as Pre-procedure Electronic Signature(s) Signed: 04/29/2023 4:39:54 PM By: Fonnie Mu RN Entered By: Fonnie Mu on 04/29/2023 13:39:54 [name] ([id number]) [id number] Page 2 of 16 -------------------------------------------------------------------------------- Compression Therapy Details Patient Name: Date of Service: [name], [name] 04/29/2023 3:00 PM Medical Record Number: [id number] Patient Account Number: [id number] Date of Birth/Sex: Treating RN: 15-Aug-1945 (77 y.o. Charlean Merl, [name] Primary Care [name]: [name] Other Clinician: Referring [name]: Treating [name]/Extender: Valentina Gu Weeks in Treatment: 56 Compression Therapy Performed for Wound Assessment: Wound #15 Left,Distal,Posterior Lower Leg Performed By: Clinician Fonnie Mu, RN Compression Type: Four Layer Post Procedure Diagnosis Same as Pre-procedure Electronic Signature(s) Signed: 04/29/2023 4:39:55 PM By: Fonnie Mu RN Entered By: Fonnie Mu on 04/29/2023 13:39:54 -------------------------------------------------------------------------------- Compression Therapy Details Patient Name: Date of Service: [name], [name] 04/29/2023 3:00 PM Medical Record Number: [id number] Patient Account Number: [id number] Date of Birth/Sex: Treating RN: [birth year] (77 y.o. Charlean Merl, [name] Primary Care Rigley Niess: [name] Other Clinician: Referring [name]: Treating [name]/Extender: Valentina Gu Weeks in Treatment: 66 Compression Therapy Performed for Wound Assessment: Wound #16 Left,Lateral Lower Leg Performed By: Clinician  Fonnie Mu, RN Compression Type: Four Layer Post Procedure Diagnosis Same as Pre-procedure Electronic Signature(s) Signed: 04/29/2023 4:39:55 PM By: Fonnie Mu RN Entered By: Fonnie Mu on 04/29/2023 13:39:55 -------------------------------------------------------------------------------- Compression Therapy Details Patient Name: Date of Service: [name], [name] 04/29/2023 3:00 PM Medical Record Number: [id number] Patient Account Number: [id number] Date of Birth/Sex: Treating RN: 22-Oct-1945 (77 y.o. Lucious Groves Primary Care [name]: [name] Other Clinician: Referring [name]: Treating [name]/Extender: Valentina Gu Weeks in Treatment: 72 Compression Therapy Performed for Wound Assessment: Wound #6 Right,Medial Lower Leg Performed By: Clinician Fonnie Mu, RN Compression Type: Four Layer Post Procedure Diagnosis Same as  Pre-procedure Electronic Signature(s) Signed: 04/29/2023 4:39:55 PM By: Fonnie Mu RN [name] ([id number]) PM By: Fonnie Mu RN [phone].pdf Page 3 of 16 Signed: 04/29/2023 4:39:55 Entered By: Fonnie Mu on 04/29/2023 13:39:55 -------------------------------------------------------------------------------- Compression Therapy Details Patient Name: Date of Service: [name], [name] 04/29/2023 3:00 PM Medical Record Number: [id number] Patient Account Number: [id number] Date of Birth/Sex: Treating RN: 1945-08-02 (77 y.o. Charlean Merl, [name] Primary Care [name]: [name] Other Clinician: Referring [name]: Treating [name]/Extender: Valentina Gu Weeks in Treatment: 30 Compression Therapy Performed for Wound Assessment: Wound #7 Left,Medial Lower Leg Performed By: Clinician Fonnie Mu, RN Compression Type: Four Layer Post Procedure Diagnosis Same as Pre-procedure Electronic Signature(s) Signed: 04/29/2023  4:39:55 PM By: Fonnie Mu RN Entered By: Fonnie Mu on 04/29/2023 13:39:55 -------------------------------------------------------------------------------- Encounter Discharge Information Details Patient Name: Date of Service: [name] 04/29/2023 3:00 PM Medical Record Number: [id number] Patient Account Number: [id number] Date of Birth/Sex: Treating RN: 24-Jun-1945 (77 y.o. Charlean Merl, [name] Primary Care Keshayla Schrum: [name] Other Clinician: Referring Zafiro Routson: Treating Clorinda Wyble/Extender: Valentina Gu Weeks in Treatment: 60 Encounter Discharge Information Items Discharge Condition: Stable Ambulatory Status: Crutches Discharge Destination: Home Transportation: Private Auto Accompanied By: self Schedule Follow-up Appointment: Yes Clinical Summary of Care: Patient Declined Electronic Signature(s) Signed: 04/29/2023 4:40:39 PM By: Fonnie Mu RN Entered By: Fonnie Mu on 04/29/2023 13:40:39 -------------------------------------------------------------------------------- Lower Extremity Assessment Details Patient Name: Date of Service: Legore, [name] 04/29/2023 3:00 PM Medical Record Number: [id number] Patient Account Number: [id number] Date of Birth/Sex: Treating RN: [birth year] (77 y.o. Lucious Groves Primary Care Darreld Hoffer: [name] Other Clinician: Lokey, Dannielle [name] ([id number]) [id number] Page 4 of 16 Referring Welford Christmas: Treating Candies Palm/Extender: Dominga Ferry, [name] Weeks in Treatment: 57 Edema Assessment Assessed: [Left: Yes] [Right: Yes] Edema: [Left: Yes] [Right: Yes] Calf Left: Right: Point of Measurement: 40 cm From Medial Instep 60 cm 62 cm Ankle Left: Right: Point of Measurement: 13 cm From Medial Instep 48 cm 53 cm Vascular Assessment Pulses: Dorsalis Pedis Palpable: [Left:Yes] [Right:Yes] Posterior Tibial Palpable: [Left:Yes] [Right:Yes] Extremity  colors, hair growth, and conditions: Extremity Color: [Left:Hyperpigmented] [Right:Hyperpigmented] Hair Growth on Extremity: [Left:No] Temperature of Extremity: [Left:Warm] [Right:Warm] Capillary Refill: [Left:> 3 seconds] [Right:> 3 seconds] Dependent Rubor: [Left:No Yes] [Right:No Yes] Toe Nail Assessment Left: Right: Thick: Yes Yes Discolored: Yes Yes Deformed: Yes Yes Improper Length and Hygiene: Yes Yes Electronic Signature(s) Signed: 04/29/2023 4:44:05 PM By: Fonnie Mu RN Entered By: Fonnie Mu on 04/29/2023 13:06:23 -------------------------------------------------------------------------------- Multi Wound Chart Details Patient Name: Date of Service: [name], [name] 04/29/2023 3:00 PM Medical Record Number: [id number] Patient Account Number: [id number] Date of Birth/Sex: Treating RN: 05/06/1946 (77 y.o. M) Primary Care Rocko Fesperman: [name] Other Clinician: Referring Terril Amaro: Treating Sharod Petsch/Extender: Valentina Gu Weeks in Treatment: 37 Vital Signs Height(in): 74 Pulse(bpm): 99 Weight(lbs): 250 Blood Pressure(mmHg): 169/73 Body Mass Index(BMI): 32.1 Temperature(F): 97.8 Respiratory Rate(breaths/min): 17 [12:Photos:] [id number].pdf Page 5 of 16] Left, Anterior Lower Leg Left, Distal, Posterior Lower Leg Left, Lateral Lower Leg Wound Location: Frostbite Gradually Appeared Blister Wounding Event: Lymphedema Lymphedema Venous Leg Ulcer Primary Etiology: Anemia, Lymphedema, Deep Vein Anemia, Lymphedema, Deep Vein Anemia, Lymphedema, Deep Vein Comorbid History: Thrombosis, Hypertension, Received Thrombosis, Hypertension, Received Thrombosis, Hypertension, Received Radiation Radiation Radiation 12/17/2022 01/14/2023 04/01/2023 Date Acquired: 19 10 4  Weeks of Treatment: Open Open Open Wound Status: No No No Wound Recurrence: No No Yes Clustered Wound: N/A N/A 3 Clustered Quantity: 6.5x6x0.1  1x1.5x0.1 6x6x0.1 Measurements L x W  x D (cm) 30.631 1.178 28.274 A (cm) : rea 3.063 0.118 2.827 Volume (cm) : -766.80% 86.70% -412.90% % Reduction in Area: -333.20% 86.70% -413.10% % Reduction in Volume: Full Thickness Without Exposed Full Thickness Without Exposed Full Thickness Without Exposed Classification: Support Structures Support Structures Support Structures Large Medium Medium Exudate Amount: Serous Serosanguineous Serous Exudate Type: amber red, brown amber Exudate Color: Distinct, outline attached Distinct, outline attached Distinct, outline attached Wound Margin: Small (1-33%) Small (1-33%) Small (1-33%) Granulation Amount: Red, Pink Pink Red Granulation Quality: Large (67-100%) Large (67-100%) Large (67-100%) Necrotic Amount: Fat Layer (Subcutaneous Tissue): Yes Fat Layer (Subcutaneous Tissue): Yes Fat Layer (Subcutaneous Tissue): Yes Exposed Structures: Fascia: No Fascia: No Fascia: No Tendon: No Tendon: No Tendon: No Muscle: No Muscle: No Muscle: No Joint: No Joint: No Joint: No Bone: No Bone: No Bone: No Small (1-33%) Small (1-33%) Small (1-33%) Epithelialization: Excoriation: No Excoriation: No Excoriation: No Periwound Skin Texture: Induration: No Induration: No Induration: No Callus: No Callus: No Callus: No Crepitus: No Crepitus: No Crepitus: No Rash: No Rash: No Rash: No Scarring: No Scarring: No Scarring: No Maceration: No Maceration: No Maceration: No Periwound Skin Moisture: Dry/Scaly: No Dry/Scaly: No Dry/Scaly: No Hemosiderin Staining: Yes Hemosiderin Staining: Yes Hemosiderin Staining: Yes Periwound Skin Color: Atrophie Blanche: No Atrophie Blanche: No Atrophie Blanche: No Cyanosis: No Cyanosis: No Cyanosis: No Ecchymosis: No Ecchymosis: No Ecchymosis: No Erythema: No Erythema: No Erythema: No Mottled: No Mottled: No Mottled: No Pallor: No Pallor: No Pallor: No Rubor: No Rubor: No Rubor:  No No Abnormality N/A No Abnormality Temperature: Wound Number: 6 7 N/A Photos: N/A Right, Medial Lower Leg Left, Medial Lower Leg N/A Wound Location: Gradually Appeared Gradually Appeared N/A Wounding Event: Lymphedema Lymphedema N/A Primary Etiology: Anemia, Lymphedema, Deep Vein Anemia, Lymphedema, Deep Vein N/A Comorbid History: Thrombosis, Hypertension, Received Thrombosis, Hypertension, Received Radiation Radiation 03/05/2022 03/05/2022 N/A Date Acquired: 10 57 N/A Weeks of Treatment: Open Open N/A Wound Status: No No N/A Wound Recurrence: Yes Yes N/A Clustered Wound: 3 1 N/A Clustered Quantity: 10.4x8x0.3 7x18x0.1 N/A Measurements L x W x D (cm) 65.345 98.96 N/A A (cm) : rea 19.604 9.896 N/A Volume (cm) : 54.30% -110.00% N/A % Reduction in Area: -37.10% -110.00% N/A % Reduction in Volume: Full Thickness Without Exposed Full Thickness Without Exposed N/A Classification: Support Structures Support Structures Buchan, Dannielle [name] ([id number]) [id number] Page 6 of 16 Large Large N/A Exudate Amount: Serous Serous N/A Exudate Type: amber amber N/A Exudate Color: Distinct, outline attached Distinct, outline attached N/A Wound Margin: Small (1-33%) Small (1-33%) N/A Granulation Amount: Pink Red N/A Granulation Quality: Large (67-100%) Large (67-100%) N/A Necrotic Amount: Fat Layer (Subcutaneous Tissue): Yes Fat Layer (Subcutaneous Tissue): Yes N/A Exposed Structures: Fascia: No Fascia: No Tendon: No Tendon: No Muscle: No Muscle: No Joint: No Joint: No Bone: No Bone: No Small (1-33%) Small (1-33%) N/A Epithelialization: Scarring: Yes Scarring: Yes N/A Periwound Skin Texture: Excoriation: No Excoriation: No Induration: No Induration: No Callus: No Callus: No Crepitus: No Crepitus: No Rash: No Rash: No Maceration: No Maceration: No N/A Periwound Skin Moisture: Dry/Scaly: No Dry/Scaly: No Hemosiderin Staining:  Yes Hemosiderin Staining: Yes N/A Periwound Skin Color: Atrophie Blanche: No Atrophie Blanche: No Cyanosis: No Cyanosis: No Ecchymosis: No Ecchymosis: No Erythema: No Erythema: No Mottled: No Mottled: No Pallor: No Pallor: No Rubor: No Rubor: No No Abnormality No Abnormality N/A Temperature: Treatment Notes Electronic Signature(s) Signed: 04/29/2023 5:18:28 PM By: Baltazar Najjar MD Entered By: Baltazar Najjar on 04/29/2023 13:36:03 -------------------------------------------------------------------------------- Multi-Disciplinary Care  Plan Details Patient Name: Date of Service: Hepler, [name] 04/29/2023 3:00 PM Medical Record Number: [id number] Patient Account Number: [id number] Date of Birth/Sex: Treating RN: Jul 09, 1945 (77 y.o. Charlean Merl, [name] Primary Care Kazuma Elena: [name] Other Clinician: Referring Anesia Blackwell: Treating Haydan Wedig/Extender: Valentina Gu Weeks in Treatment: 43 Multidisciplinary Care Plan reviewed with physician Active Inactive Wound/Skin Impairment Nursing Diagnoses: Impaired tissue integrity Knowledge deficit related to ulceration/compromised skin integrity Goals: Patient will have a decrease in wound volume by X% from date: (specify in notes) Date Initiated: 03/26/2022 Target Resolution Date: 05/19/2025 Goal Status: Active Patient/caregiver will verbalize understanding of skin care regimen Date Initiated: 03/26/2022 Date Inactivated: 03/25/2023 Target Resolution Date: 04/17/2025 Unmet Reason: patient missed last Goal Status: Unmet week. Patient Stop taking his diuertic. Ulcer/skin breakdown will have a volume reduction of 30% by week 4 Date Initiated: 03/26/2022 Date Inactivated: 05/21/2022 Target Resolution Date: 05/17/2022 Unmet Reason: see wound Goal Status: Unmet measurement. Libbey, Dannielle [name] ([id number]) [id number] Page 7 of 16 Ulcer/skin breakdown will have a volume reduction of 50%  by week 8 Date Initiated: 03/26/2022 Date Inactivated: 05/21/2022 Target Resolution Date: 05/17/2022 Unmet Reason: see wound Goal Status: Unmet measurement. Interventions: Assess patient/caregiver ability to obtain necessary supplies Assess patient/caregiver ability to perform ulcer/skin care regimen upon admission and as needed Assess ulceration(s) every visit Notes: Patient stated today, "I will take my fluid pill or pump not do both." Kao Conry made aware. Electronic Signature(s) Signed: 04/29/2023 3:27:57 PM By: Fonnie Mu RN Entered By: Fonnie Mu on 04/29/2023 12:27:55 -------------------------------------------------------------------------------- Pain Assessment Details Patient Name: Date of Service: Jeng, [name] 04/29/2023 3:00 PM Medical Record Number: [id number] Patient Account Number: [id number] Date of Birth/Sex: Treating RN: 12/29/1945 (77 y.o. Charlean Merl, [name] Primary Care Ilhan Madan: [name] Other Clinician: Referring Chantelle Verdi: Treating Maxine Fredman/Extender: Valentina Gu Weeks in Treatment: 56 Active Problems Location of Pain Severity and Description of Pain Patient Has Paino Yes Site Locations Pain Location: Generalized Pain, Pain in Ulcers With Dressing Change: Yes Duration of the Pain. Constant / Intermittento Intermittent Rate the pain. Current Pain Level: 8 Worst Pain Level: 10 Least Pain Level: 0 Tolerable Pain Level: 8 Character of Pain Describe the Pain: Aching Pain Management and Medication Current Pain Management: Medication: No Cold Application: No Rest: No Massage: No Activity: No T.E.N.S.: No Heat Application: No Leg drop or elevation: No Is the Current Pain Management Adequate: Adequate How does your wound impact your activities of daily livingo Sleep: No Bathing: No Appetite: No Relationship With Others: No Bladder Continence: No Emotions: No Bowel Continence: No Work: No Toileting:  No Drive: No Dressing: No Hobbies: No Ealy, Colston ([id number]) [id number] Page 8 of 16 Electronic Signature(s) Signed: 04/29/2023 4:44:05 PM By: Fonnie Mu RN Entered By: Fonnie Mu on 04/29/2023 12:54:11 -------------------------------------------------------------------------------- Patient/Caregiver Education Details Patient Name: Date of Service: Mousseau, [name] 12/11/2024andnbsp3:00 PM Medical Record Number: [id number] Patient Account Number: [id number] Date of Birth/Gender: Treating RN: Apr 20, 1946 (77 y.o. Lucious Groves Primary Care Physician: [name] Other Clinician: Referring Physician: Treating Physician/Extender: Stark Klein in Treatment: 80 Education Assessment Education Provided To: Patient Education Topics Provided Wound/Skin Impairment: Methods: Explain/Verbal Responses: Reinforcements needed, State content correctly Electronic Signature(s) Signed: 04/29/2023 4:44:05 PM By: Fonnie Mu RN Entered By: Fonnie Mu on 04/29/2023 12:28:12 -------------------------------------------------------------------------------- Wound Assessment Details Patient Name: Date of Service: Mcwherter, [name] 04/29/2023 3:00 PM Medical Record Number: [id number] Patient Account Number: [id number] Date of Birth/Sex: Treating RN: Jul 07, 1945 (77 y.o. Charlean Merl,  [name] Primary Care Alma Mohiuddin: [name] Other Clinician: Referring Curtina Grills: Treating Tashiya [name]/Extender: Valentina Gu Weeks in Treatment: 79 Wound Status Wound Number: 12 Primary Lymphedema Etiology: Wound Location: Left, Anterior Lower Leg Wound Open Wounding Event: Frostbite Status: Date Acquired: 12/17/2022 Comorbid Anemia, Lymphedema, Deep Vein Thrombosis, Hypertension, Weeks Of Treatment: 19 History: Received Radiation Clustered Wound: No Photos Hai, Dannielle [name] ([id number])  [id number] Page 9 of 16 Wound Measurements Length: (cm) 6.5 Width: (cm) 6 Depth: (cm) 0.1 Area: (cm) 30.631 Volume: (cm) 3.063 % Reduction in Area: -766.8% % Reduction in Volume: -333.2% Epithelialization: Small (1-33%) Tunneling: No Undermining: No Wound Description Classification: Full Thickness Without Exposed Support Structures Wound Margin: Distinct, outline attached Exudate Amount: Large Exudate Type: Serous Exudate Color: amber Foul Odor After Cleansing: No Slough/Fibrino Yes Wound Bed Granulation Amount: Small (1-33%) Exposed Structure Granulation Quality: Red, Pink Fascia Exposed: No Necrotic Amount: Large (67-100%) Fat Layer (Subcutaneous Tissue) Exposed: Yes Necrotic Quality: Adherent Slough Tendon Exposed: No Muscle Exposed: No Joint Exposed: No Bone Exposed: No Periwound Skin Texture Texture Color No Abnormalities Noted: No No Abnormalities Noted: No Callus: No Atrophie Blanche: No Crepitus: No Cyanosis: No Excoriation: No Ecchymosis: No Induration: No Erythema: No Rash: No Hemosiderin Staining: Yes Scarring: No Mottled: No Pallor: No Moisture Rubor: No No Abnormalities Noted: No Dry / Scaly: No Temperature / Pain Maceration: No Temperature: No Abnormality Treatment Notes Wound #12 (Lower Leg) Wound Laterality: Left, Anterior Cleanser Soap and Water Discharge Instruction: May shower and wash wound with dial antibacterial soap and water prior to dressing change. Peri-Wound Care Sween Lotion (Moisturizing lotion) Discharge Instruction: Apply moisturizing lotion as directed Topical Primary Dressing Maxorb Extra CMC/Alginate Dressing, 4x4 (in/in) Discharge Instruction: Apply to wound bed as instructed Secondary Dressing ABD Pad, 8x10 Discharge Instruction: Apply over primary dressing as directed. Zetuvit Plus 4x8 in Discharge Instruction: Apply over primary dressing as directed. Lequire, Dannielle [name] ([id number])  [id number] Page 10 of 16 OptiLock Super Absorbent, 5x5.5 (in/in) Discharge Instruction: Apply directly to wound bed as directed Secured With Compression Wrap Urgo K2, (equivalent to a 4 layer) two layer compression system, regular Discharge Instruction: Apply Urgo K2 as directed (alternative to 4 layer compression). Compression Stockings Add-Ons Electronic Signature(s) Signed: 04/29/2023 4:44:05 PM By: Fonnie Mu RN Signed: 04/29/2023 5:03:36 PM By: Dayton Scrape Entered By: Dayton Scrape on 04/29/2023 13:13:53 -------------------------------------------------------------------------------- Wound Assessment Details Patient Name: Date of Service: Frees, [name] 04/29/2023 3:00 PM Medical Record Number: [id number] Patient Account Number: [id number] Date of Birth/Sex: Treating RN: 05/19/1946 (77 y.o. Charlean Merl, [name] Primary Care Kristjan Derner: [name] Other Clinician: Referring Augustine Leverette: Treating Johna Kearl/Extender: Valentina Gu Weeks in Treatment: 78 Wound Status Wound Number: 15 Primary Lymphedema Etiology: Wound Location: Left, Distal, Posterior Lower Leg Wound Open Wounding Event: Gradually Appeared Status: Date Acquired: 01/14/2023 Comorbid Anemia, Lymphedema, Deep Vein Thrombosis, Hypertension, Weeks Of Treatment: 10 History: Received Radiation Clustered Wound: No Photos Wound Measurements Length: (cm) 1 Width: (cm) 1.5 Depth: (cm) 0.1 Area: (cm) 1.178 Volume: (cm) 0.118 % Reduction in Area: 86.7% % Reduction in Volume: 86.7% Epithelialization: Small (1-33%) Tunneling: No Undermining: No Wound Description Classification: Full Thickness Without Exposed Suppor Wound Margin: Distinct, outline attached Exudate Amount: Medium Exudate Type: Serosanguineous Exudate Color: red, brown t Structures Foul Odor After Cleansing: No Slough/Fibrino Yes Wound Bed Granulation Amount: Small (1-33%) Exposed  Structure Granulation Quality: Pink Fascia Exposed: No Necrotic Amount: Large (67-100%) Fat Layer (Subcutaneous Tissue) Exposed: Yes Pajak, Kristapher ([id number]) [id number] Page 11 of 16 Necrotic Quality:  Adherent Slough Tendon Exposed: No Muscle Exposed: No Joint Exposed: No Bone Exposed: No Periwound Skin Texture Texture Color No Abnormalities Noted: No No Abnormalities Noted: No Callus: No Atrophie Blanche: No Crepitus: No Cyanosis: No Excoriation: No Ecchymosis: No Induration: No Erythema: No Rash: No Hemosiderin Staining: Yes Scarring: No Mottled: No Pallor: No Moisture Rubor: No No Abnormalities Noted: No Dry / Scaly: No Maceration: No Treatment Notes Wound #15 (Lower Leg) Wound Laterality: Left, Posterior, Distal Cleanser Soap and Water Discharge Instruction: May shower and wash wound with dial antibacterial soap and water prior to dressing change. Peri-Wound Care Sween Lotion (Moisturizing lotion) Discharge Instruction: Apply moisturizing lotion as directed Topical Primary Dressing Maxorb Extra CMC/Alginate Dressing, 4x4 (in/in) Discharge Instruction: Apply to wound bed as instructed Secondary Dressing ABD Pad, 8x10 Discharge Instruction: Apply over primary dressing as directed. Zetuvit Plus 4x8 in Discharge Instruction: Apply over primary dressing as directed. OptiLock Super Absorbent, 5x5.5 (in/in) Discharge Instruction: Apply directly to wound bed as directed Secured With Compression Wrap Urgo K2, (equivalent to a 4 layer) two layer compression system, regular Discharge Instruction: Apply Urgo K2 as directed (alternative to 4 layer compression). Compression Stockings Add-Ons Electronic Signature(s) Signed: 04/29/2023 4:44:05 PM By: Fonnie Mu RN Signed: 04/29/2023 5:03:36 PM By: Dayton Scrape Entered By: Dayton Scrape on 04/29/2023  13:14:27 -------------------------------------------------------------------------------- Wound Assessment Details Patient Name: Date of Service: [name], [name] 04/29/2023 3:00 PM Medical Record Number: [id number] Patient Account Number: [id number] Date of Birth/Sex: Treating RN: 02-03-1946 (77 y.o. Charlean Merl, [name] Primary Care Roshan Roback: [name] Other Clinician: Referring Romel Dumond: Treating Paolo Okane/Extender: Valentina Gu Lenker, Dannielle [name] ([id number]) [id number] Page 12 of 16 Weeks in Treatment: 57 Wound Status Wound Number: 16 Primary Venous Leg Ulcer Etiology: Wound Location: Left, Lateral Lower Leg Wound Open Wounding Event: Blister Status: Date Acquired: 04/01/2023 Comorbid Anemia, Lymphedema, Deep Vein Thrombosis, Hypertension, Weeks Of Treatment: 4 History: Received Radiation Clustered Wound: Yes Photos Wound Measurements Length: (cm) Width: (cm) Depth: (cm) Clustered Quantity: Area: (cm) Volume: (cm) 6 % Reduction in Area: -412.9% 6 % Reduction in Volume: -413.1% 0.1 Epithelialization: Small (1-33%) 3 Tunneling: No 28.274 Undermining: No 2.827 Wound Description Classification: Full Thickness Without Exposed Sup Wound Margin: Distinct, outline attached Exudate Amount: Medium Exudate Type: Serous Exudate Color: amber port Structures Foul Odor After Cleansing: No Slough/Fibrino Yes Wound Bed Granulation Amount: Small (1-33%) Exposed Structure Granulation Quality: Red Fascia Exposed: No Necrotic Amount: Large (67-100%) Fat Layer (Subcutaneous Tissue) Exposed: Yes Necrotic Quality: Adherent Slough Tendon Exposed: No Muscle Exposed: No Joint Exposed: No Bone Exposed: No Periwound Skin Texture Texture Color No Abnormalities Noted: Yes No Abnormalities Noted: No Atrophie Blanche: No Moisture Cyanosis: No No Abnormalities Noted: Yes Ecchymosis: No Erythema: No Hemosiderin Staining:  Yes Mottled: No Pallor: No Rubor: No Temperature / Pain Temperature: No Abnormality Treatment Notes Wound #16 (Lower Leg) Wound Laterality: Left, Lateral Cleanser Soap and Water Discharge Instruction: May shower and wash wound with dial antibacterial soap and water prior to dressing change. Peri-Wound Care Sween Lotion (Moisturizing lotion) Discharge Instruction: Apply moisturizing lotion as directed Lohmann, Keary ([id number]) [id number] Page 13 of 16 Topical Primary Dressing Maxorb Extra CMC/Alginate Dressing, 4x4 (in/in) Discharge Instruction: Apply to wound bed as instructed Secondary Dressing ABD Pad, 8x10 Discharge Instruction: Apply over primary dressing as directed. Zetuvit Plus 4x8 in Discharge Instruction: Apply over primary dressing as directed. OptiLock Super Absorbent, 5x5.5 (in/in) Discharge Instruction: Apply directly to wound bed as directed Secured With Compression Wrap Urgo K2, (equivalent to a 4  layer) two layer compression system, regular Discharge Instruction: Apply Urgo K2 as directed (alternative to 4 layer compression). Compression Stockings Add-Ons Electronic Signature(s) Signed: 04/29/2023 4:44:05 PM By: Fonnie Mu RN Signed: 04/29/2023 5:03:36 PM By: Dayton Scrape Entered By: Dayton Scrape on 04/29/2023 13:14:56 -------------------------------------------------------------------------------- Wound Assessment Details Patient Name: Date of Service: [name], [name] 04/29/2023 3:00 PM Medical Record Number: [id number] Patient Account Number: [id number] Date of Birth/Sex: Treating RN: [birth year] (77 y.o. Charlean Merl, [name] Primary Care Tezra Mahr: [name] Other Clinician: Referring Phelicia Dantes: Treating Marke Goodwyn/Extender: Valentina Gu Weeks in Treatment: 75 Wound Status Wound Number: 6 Primary Lymphedema Etiology: Wound Location: Right, Medial Lower Leg Wound Open Wounding Event: Gradually  Appeared Status: Date Acquired: 03/05/2022 Comorbid Anemia, Lymphedema, Deep Vein Thrombosis, Hypertension, Weeks Of Treatment: 57 History: Received Radiation Clustered Wound: Yes Photos Wound Measurements Length: (cm) 10.4 Width: (cm) 8 Depth: (cm) 0.3 Clustered Quantity: 3 Pfahler, Nilton ([id number]) Area: (cm) 65.345 Volume: (cm) 19.604 % Reduction in Area: 54.3% % Reduction in Volume: -37.1% Epithelialization: Small (1-33%) Tunneling: No [id number] Page 14 of 16 Undermining: No Wound Description Classification: Full Thickness Without Exposed Suppor Wound Margin: Distinct, outline attached Exudate Amount: Large Exudate Type: Serous Exudate Color: amber t Structures Foul Odor After Cleansing: No Slough/Fibrino Yes Wound Bed Granulation Amount: Small (1-33%) Exposed Structure Granulation Quality: Pink Fascia Exposed: No Necrotic Amount: Large (67-100%) Fat Layer (Subcutaneous Tissue) Exposed: Yes Necrotic Quality: Adherent Slough Tendon Exposed: No Muscle Exposed: No Joint Exposed: No Bone Exposed: No Periwound Skin Texture Texture Color No Abnormalities Noted: No No Abnormalities Noted: No Callus: No Atrophie Blanche: No Crepitus: No Cyanosis: No Excoriation: No Ecchymosis: No Induration: No Erythema: No Rash: No Hemosiderin Staining: Yes Scarring: Yes Mottled: No Pallor: No Moisture Rubor: No No Abnormalities Noted: No Dry / Scaly: No Temperature / Pain Maceration: No Temperature: No Abnormality Treatment Notes Wound #6 (Lower Leg) Wound Laterality: Right, Medial Cleanser Soap and Water Discharge Instruction: May shower and wash wound with dial antibacterial soap and water prior to dressing change. Peri-Wound Care Sween Lotion (Moisturizing lotion) Discharge Instruction: Apply moisturizing lotion as directed Topical Primary Dressing Maxorb Extra CMC/Alginate Dressing, 4x4 (in/in) Discharge Instruction: Apply to  wound bed as instructed Secondary Dressing ABD Pad, 8x10 Discharge Instruction: Apply over primary dressing as directed. Zetuvit Plus 4x8 in Discharge Instruction: Apply over primary dressing as directed. OptiLock Super Absorbent, 5x5.5 (in/in) Discharge Instruction: Apply directly to wound bed as directed Secured With Compression Wrap Urgo K2, (equivalent to a 4 layer) two layer compression system, regular Discharge Instruction: Apply Urgo K2 as directed (alternative to 4 layer compression). Compression Stockings Add-Ons Electronic Signature(s) Signed: 04/29/2023 4:44:05 PM By: Fonnie Mu RN Signed: 04/29/2023 5:03:36 PM By: Dayton Scrape Entered By: Dayton Scrape on 04/29/2023 13:15:31 Frenz, Dannielle [name] ([id number]) [id number] Page 15 of 16 -------------------------------------------------------------------------------- Wound Assessment Details Patient Name: Date of Service: [name], [name] 04/29/2023 3:00 PM Medical Record Number: [id number] Patient Account Number: [id number] Date of Birth/Sex: Treating RN: [birth year] (77 y.o. Charlean Merl, [name] Primary Care Shahira Fiske: [name] Other Clinician: Referring Deni Berti: Treating Shizue Kaseman/Extender: Valentina Gu Weeks in Treatment: 66 Wound Status Wound Number: 7 Primary Lymphedema Etiology: Wound Location: Left, Medial Lower Leg Wound Open Wounding Event: Gradually Appeared Status: Date Acquired: 03/05/2022 Comorbid Anemia, Lymphedema, Deep Vein Thrombosis, Hypertension, Weeks Of Treatment: 57 History: Received Radiation Clustered Wound: Yes Photos Wound Measurements Length: (cm) Width: (cm) Depth: (cm) Clustered Quantity: Area: (cm) Volume: (cm) 7 % Reduction in Area: -110%  18 % Reduction in Volume: -110% 0.1 Epithelialization: Small (1-33%) 1 Tunneling: No 98.96 Undermining: No 9.896 Wound Description Classification: Full Thickness Without Exposed Sup Wound  Margin: Distinct, outline attached Exudate Amount: Large Exudate Type: Serous Exudate Color: amber port Structures Foul Odor After Cleansing: No Slough/Fibrino Yes Wound Bed Granulation Amount: Small (1-33%) Exposed Structure Granulation Quality: Red Fascia Exposed: No Necrotic Amount: Large (67-100%) Fat Layer (Subcutaneous Tissue) Exposed: Yes Necrotic Quality: Adherent Slough Tendon Exposed: No Muscle Exposed: No Joint Exposed: No Bone Exposed: No Periwound Skin Texture Texture Color No Abnormalities Noted: No No Abnormalities Noted: No Callus: No Atrophie Blanche: No Crepitus: No Cyanosis: No Excoriation: No Ecchymosis: No Induration: No Erythema: No Rash: No Hemosiderin Staining: Yes Scarring: Yes Mottled: No Pallor: No Moisture Rubor: No No Abnormalities Noted: No Scholze, Abbie ([id number]) [id number] Page 16 of 16 Dry / Scaly: No Temperature / Pain Maceration: No Temperature: No Abnormality Treatment Notes Wound #7 (Lower Leg) Wound Laterality: Left, Medial Cleanser Soap and Water Discharge Instruction: May shower and wash wound with dial antibacterial soap and water prior to dressing change. Peri-Wound Care Sween Lotion (Moisturizing lotion) Discharge Instruction: Apply moisturizing lotion as directed Topical Primary Dressing Maxorb Extra CMC/Alginate Dressing, 4x4 (in/in) Discharge Instruction: Apply to wound bed as instructed Secondary Dressing ABD Pad, 8x10 Discharge Instruction: Apply over primary dressing as directed. Zetuvit Plus 4x8 in Discharge Instruction: Apply over primary dressing as directed. OptiLock Super Absorbent, 5x5.5 (in/in) Discharge Instruction: Apply directly to wound bed as directed Secured With Compression Wrap Urgo K2, (equivalent to a 4 layer) two layer compression system, regular Discharge Instruction: Apply Urgo K2 as directed (alternative to 4 layer compression). Compression  Stockings Add-Ons Electronic Signature(s) Signed: 04/29/2023 4:44:05 PM By: Fonnie Mu RN Signed: 04/29/2023 5:03:36 PM By: Dayton Scrape Entered By: Dayton Scrape on 04/29/2023 13:16:00 -------------------------------------------------------------------------------- Vitals Details Patient Name: Date of Service: [name], [name] 04/29/2023 3:00 PM Medical Record Number: [id number] Patient Account Number: [id number] Date of Birth/Sex: Treating RN: [birth year] (77 y.o. Charlean Merl, [name] Primary Care Jhamal Plucinski: [name] Other Clinician: Referring Jane Broughton: Treating Julis Haubner/Extender: Valentina Gu Weeks in Treatment: 46 Vital Signs Time Taken: 16:20 Temperature (F): 97.8 Height (in): 74 Pulse (bpm): 99 Weight (lbs): 250 Respiratory Rate (breaths/min): 17 Body Mass Index (BMI): 32.1 Blood Pressure (mmHg): 169/73 Reference Range: 80 - 120 mg / dl Electronic Signature(s) Signed: 04/29/2023 4:44:05 PM By: Fonnie Mu RN Entered By: Fonnie Mu on 04/29/2023 13:21:04

## 2023-04-30 NOTE — Progress Notes (Signed)
[name] [name] Huh ([id number]) [id number] Page 1 of 14 Visit Report for 04/29/2023 HPI Details Patient Name: Date of Service: [name] [name] 04/29/2023 3:00 PM Medical Record Number: [id number] Patient Account Number: [id number] Date of Birth/Sex: Treating RN: 05-Aug-1945 (77 y.o. M) Primary Care Provider: [name] Other Clinician: Referring Provider: Treating Provider/Extender: Valentina Gu Weeks in Treatment: 31 History of Present Illness HPI Description: ADMISSION 02/23/2020 Patient is a 77 year old man who lives in [place] who arrives accompanied by his wife. He has a history of chronic lymphedema and venous insufficiency in his bilateral lower legs which may have something to do that with having a history of DVT as well as being treated for prostate cancer. In any case he recently got compression pumps at home but compliance has been an issue here. He has compression stockings however they are probably not sufficient enough to control swelling. They tell us that things deteriorated for him in late August he was admitted to [facility] for 7 days. This was with cellulitis I think of his bilateral lower legs. Discharge he was noted to have wounds on his bilateral lower legs. He was discharged on Bactrim. They tried to get him home health through [facility] of course they declined him. His wife is been wrapping these applying some form of silver foam dressing. He has a history of wounds before although nothing that would not heal with basic home topical dressings. He has 2 areas on the left medial, left anterior and left lateral and a smaller area on the right medial. All of these have considerable depth. Past medical history includes iron deficiency anemia, lymphedema followed by the rehab center at [facility] with lymphedema wraps I believe, DVT on chronic anticoagulation, prostate cancer, chronic venous  insufficiency, hypertension. As mentioned he has compression pumps but does not use them. ABIs in our clinic were noncompressible bilaterally 10/14; patient with severe bilateral lymphedema right greater than left. He came in with bilateral lower extremity wounds left greater than right. Even though the right side has more of the edema most of the wounds here almost closed on the right medial. He has 3 remaining wounds on the left We have been using silver alginate under 4-layer compression I have been trying to get him to be compliant with his external compression pumps 10/21; patient with 3 small wounds on the left leg and 1 on the right medial in the setting of severe lymphedema and chronic venous insufficiency. We have been using silver alginate under 4-layer compression he is using his external compression pumps twice a day 11/4; ARTERIAL STUDIES on the right show an ABI of 1.02 TBI of 0.858 with biphasic waveforms on the left 0.98 with a TBI of 0.55 and biphasic waveforms. Does not look like he has significant arterial disease. We are treating him for lymphedema he has compression pumps. He has punched-out areas on the left anterior left lateral and right medial lower extremities 11/11; after we obtained his arterial studies I put him in 4 layer compression. He is using his compression pumps probably once a day although I have asked him to do twice. Primary dressing to the wound is silver collagen he has severe lymphedema likely secondary to chronic venous insufficiency. Wounds on the left lateral, left medial and left anterior and a small area on the right medial 12/2; the area on the right anterior lower leg has healed. We initially thought that the area medially had healed as  well however when her discharge nurse came in she detected fluid in the wound simply opened up. This is actually worse than I remember this pain. The area on the left lateral potentially slightly smaller He is also  complaining about pain in his left hand he says that this is actually been getting some better he has been using topical creams on this. She asked that I look at this 12/9 after last weeks issues we have 2 wounds one on the right medial lower leg and 1 on the left lateral. Both of these are in the same condition. I think because of thickened skin secondary to chronic lymphedema these wounds actually have depth of almost 0.8 cm. 12/16; the patient has 2 small but deep wounds one on the right medial and one on the left lateral. The right medial is actually the worst of these. He arrives in clinic today with absolutely terrible edema in the right leg apparently his 4-layer wrap fell down to just above his ankle he did not think about this he is apparently been continuing to use his compression pump twice a day. The left leg looks a lot better. 05/09/2020 upon evaluation today patient appears to be doing decently well in regard to his wounds. Everything is measuring smaller the right leg still has a little bit deeper wound in the left seems to be almost completely healed in my opinion I am very pleased in general with how things are progressing. He has a 4- layer compression wrap we have been using endoform today we will probably have to use collagen just based on the fact that we do not have endoform it is on order. 1/6; the patient's wound on the left lateral lower leg has healed. Still has 1 on the right medial. 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 Using compression pumps at home twice a day. 1/13; left lateral lower leg is still healed. He has a deep punched out rectangular shaped wound on the right medial calf. Looking down at this it appears that he is attempting to epithelialize around the edges of the wound and on the base as well. His edema is reasonably well controlled we have been using collagen with absolutely no effect 1/20; left lateral lower leg remains closed he has  extremitease stockings. The area on the right medial calf I aggressively debrided last week measures larger but the surface looks better. We have been using Hydrofera Blue. We ran Oasis through his insurance but we have not seen the results of this 1/27; left lower leg wound with chronic venous insufficiency and secondary lymphedema. I did aggressive debridement on this last week the wound seems to have come in healthy looking surface using Hydrofera Blue. He was denied for Oasis 2/3; small divot in the right medial lower leg. Under illumination the walls of this divot are epithelialized however the base has slough which I removed with a curette we have been using Hydrofera Blue 2/10 small divot on the right medial lower leg pinpoint illumination at the base of this cone-shaped wound. We have been using Hydrofera Blue but I will switch to calcium alginate this week 2/17; the small divot on the right medial lower leg is fully epithelialized. There is no visible open area under illumination. He has his own stocking for the right leg similar to the one he has been wearing on the left. [name] [name] Huh ([id number]) [id number] Page 2 of 14 03/26/2022; READMISSION This is a now 77 year old man that  we had in the clinic from 02/23/2020 through 07/05/2020. At that point he had bilateral lower extremity wounds left greater than right in the setting of severe lymphedema. He had already obtained compression pumps ordered for him I think from the wound care clinic in [facility] so I do not really have record of what he has been using. He claims to be using them once a day but there is a problem with the sleeve on the left leg. About 2 weeks ago he was hospitalized from 03/11/2022 through 03/14/2022 with diastolic congestive heart failure. 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 He was diuresed. Developed some prerenal azotemia  and he has not been taking any diuretics currently. He has not been putting stockings on his legs since he got out of hospital and still has his legs dependent for long periods. Past medical history history of prostate cancer treated with prostatectomy and radiation this was apparently about 8 years ago, history of DVT on chronic Coumadin, history of lymphedema was managed for a while at the clinic in [place]. History of inguinal hernia repair in September 22, hypertension, stage IIIb chronic renal failure ABIs today were noncompressible on the right 1.12 on the left 04-02-2022 upon evaluation today patient appears to be doing well currently in regard to his legs I do feel like both areas that are draining are actually much drier than they were in the picture last week although the left is drier than the right. He is tolerating the 4-layer compression wraps at this point he did contact the pump company and they are actually working on getting him a new compression sleeve for one of his legs which have previously popped and was not functioning properly. 04-23-2022 upon evaluation today patient appears to be doing well currently in regard to his wounds on the legs. I am actually very pleased with where things stand and I do feel like that we are headed in the right direction. Fortunately there is no sign of active infection locally or systemically at this time. 05-07-2022 upon evaluation today patient appears to be doing well currently in regard to his wounds in fact things are showing signs of improvement which is good news I do not see too much that actually appears to be open and I am very pleased in that regard. No fevers, chills, nausea, vomiting, or diarrhea. 05-21-2022 upon evaluation today patient appears to be doing somewhat poorly in regard to drainage of his lower extremities bilaterally. The right is greater than left as far as the weeping area. Nonetheless it seems to be getting worse not  better. He actually has pitting edema which is at least 2+ to the thighs and I am concerned about the fact that he is may be fluid overloaded in general and that is the reason why we cannot get this under control. I know he is not using his pumps all the time because he actually told the nurse that he was either going to pump or he was going to use his fluid pills but not do both. For that reason I do think that he needs to be really doing both in order to get the fluid out as effectively as possible obviously with the 4-layer compression wraps were doing as much as we can from a compression standpoint but it is really not enough. He tells me that he elevates his leg is much as he can in between pumping and other  activity throughout the day. 05-28-2022 upon evaluation today patient appears to be doing better in regard to his wounds although the measurements may be a little bit larger this is a very difficult wound to heal it is very indistinct in a lot of areas. Nonetheless there is can be some need for sharp debridement in regard to both medial and lateral legs. Fortunately I see no signs of active infection locally nor systemically at this time. No fevers, chills, nausea, vomiting, or diarrhea. 06-04-2022 upon evaluation today patient appears to be doing poorly in general in regard to the wounds on his legs. He still continues to have a tremendous amount of fluid not just in the lower portion of his leg but to be honest his thigh where he has 2-3+ pitting edema in the thigh as well. Unfortunately I do not know that we will be able to get this healed effectively and keep it healed on the lower extremities unless he gets the overall fluid situation taking and under control. Fortunately I do not see any signs of infection locally nor systemically which is great news. He just seems to be very fluid overloaded. 06-11-2022 upon evaluation today patient presents for follow-up concerning his bilateral lower  extremity lymphedema secondary to chronic venous insufficiency. He has been tolerating the dressing changes with the compression wraps without complication. Fortunately I do not see any evidence of infection at this time which is great news. No fevers, chills, nausea, vomiting, or diarrhea. 06-18-2022 upon evaluation today patient appears to be doing well currently in regard to his wounds as far as not looking like they are terribly infected but nonetheless I am concerned about a subacute infection secondary to the fact that he continues to have spreading despite the compression therapy. We actually did do an Unna boot on him last week this is actually the first wrap that actually stayed up everything else has been sliding down quite significantly. Fortunately there does not appear to be any signs of infection systemically at this time. With that being said I do believe that locally there seems to be an issue going on here and again I Ernie Hew do a PCR culture to see what that shows also think that I am going to put him on a broad-spectrum antibiotic, doxycycline to see how that will help as well. He does tell me that coming into the clinic today that he was feeling short of breath like "he was about to have a heart attack" because he was having such a hard time breathing. He says that he told this to Dr. Jodelle Green his cardiologist as well when he was evaluated in the past 1 to 2 weeks. 07-02-2022 upon evaluation today patient appears to be doing poorly currently in regard to his wound. He has been tolerating the dressing changes. Unfortunately he has not had any compression wraps on for the past week because he was unable to make it in for his appointment last week. With that being said he has a significant amount of drainage he tells me has been using his pumps but despite this in the pumps he still has been draining quite a bit. The drainage is also somewhat purulent unfortunately. We did attempt to get in  touch with his cardiologist last week unfortunately we were unable to get up with him I did advise that the patient needs to get in touch with him upon leaving today in order to make sure they know he is on the new antibiotics I  am going to send him this will be Levaquin and Augmentin. 07-09-2022 upon evaluation today patient appears to be doing about the same in regard to his legs he may have just a slight amount of improvement with regard to the drainage probably Keystone topical antibiotics are helping in this regard to some degree. Fortunately there does not appear to be any signs of active infection systemically which is great news. No fevers, chills, nausea, vomiting, or diarrhea. 07-16-2022 upon evaluation today patient appears to be doing well currently in regard to his wound. He has been tolerating the dressing changes without complication. Fortunately there does not appear to be any signs of active infection locally nor systemically at this time. With that being said he cannot keep the wraps up he tells me on the left side he had to cut this down because it got too tight. He has been using his pumps but he is on the right side the wrap actually straight down causing some pushing around the central part of his leg just below the calf I think this is a bigger risk for him that help at this point. I think that we may need to try something different he should be getting his compression socks shortly he tells me they were ordered last Thursday. 3/6; ; this is a patient who lives in [place]. He has severe bilateral lymphedema. He has compression pumps, we have been using kerlix Ace wrap Keystone. He is changing the dressing. We do not have home health. 08-06-2022 upon evaluation today patient appears to be doing a little better in regard to his wounds in general at this point. Fortunately there does not appear to be any signs of active infection locally nor systemically at this time which is great  news and overall I am extremely pleased with where we stand today. 08-13-2022 upon evaluation today patient appears to actually be doing significantly better compared to last week. He actually did go to the hospital I told him that he needed to when he left here and he actually did go. With that being said they actually ended up admitting him he was having shortness of breath and I thought it might be related to congestive heart failure turns out he actually had a pulmonary embolism. Subsequently they were able to get him off of the Coumadin switching over to Eliquis to get things stabilized in that regard they also had them wrapped and got his swelling under control on his legs he actually looks much better pretty much across the board at this point. I am very pleased in that regard. With that being said I am very happy that he finally went that could have been a very dangerous situation. 08-20-2022 upon evaluation today patient appears to be doing well currently in regard to his wound. Has been tolerating the dressing changes without complication. Fortunately there does not appear to be any signs of active infection locally nor systemically at this time. I think his legs are doing better there is some need for sharp debridement today. Hollett, [name] Huh ([id number]) [id number] Page 3 of 14 08-27-2022 upon evaluation patient is actually making excellent progress. I am actually very pleased with where he stands and I think that he is moving in the right direction. In general I think that we are looking pretty good at the moment. 09-03-2022 upon evaluation today patient appears to be doing well currently in regard to his wound. He is actually tolerating dressing changes on the left and right leg  without complication. Fortunately I do not see any need for debridement of the left leg the right leg I think we probably do some need to perform some debridement here. 09-10-2022 upon  evaluation today patient's wounds actually showed signs of improvement in both legs I do not see much is going require debridement today which was great news. Fortunately I do not see any evidence of infection which I think is also excellent he seems to be using his pumps and doing everything right I am happy about how this is progressing at this point. 09-17-2022 upon evaluation today patient appears to be doing decently well in regard to his wounds. He has been tolerating the dressing changes without complication. Fortunately there does not appear to be any signs of active infection at this time which is good news. 09-24-2022 upon evaluation today patient appears to be doing well currently in regard to his wounds. He has been tolerating the dressing changes without complication. Fortunately there does not appear to be any signs of active infection locally nor systemically which is great news. No fevers, chills, nausea, vomiting, or diarrhea. 10-08-2022 upon evaluation today patient appears to be doing excellent currently in regard to his wound. He has been tolerating the dressing changes without complication though it does not sound like he has been using his compression wraps for a bit here. He does think he was doing better with the [facility] topical antibiotics we can definitely go back to using that but I think the biggest issue here is that his swelling is just very out of control and needs to be under control. I discussed that with him today. 10-15-2022 upon evaluation today patient appears to be doing well currently in regard to his wounds. He is actually making some progress which is good news. Fortunately I do not see any evidence of active infection locally nor systemically which is great news as well. No fevers, chills, nausea, vomiting, or diarrhea. He does have a callused area on the plantar aspect of his left foot which is actually causing some pain and he wonders if I can trim this down for  him. 10-22-2022 upon evaluation today patient seems to be making progress. He is actually doing quite well and very pleased in that regard. I do not see any signs of active infection at this time. 11-05-2022 upon evaluation patient appears to be making progress although slowly towards closure. He seems to be doing well with regard to his legs were still using the [facility] topical antibiotics and he seems to be doing quite well. He is going require some sharp debridement today. 11-19-2022 upon evaluation today patient unfortunately appears to be extremely swollen at this point. He tells me that he ran out of supplies he also tells me his leg started leaking more because of not having supplies he was unable to wear his compression wraps that is the juxta fit compression wraps. Therefore his legs are extremely swollen much larger than normal and do not appear to be doing well at all today. He is going require some debridement I also think he is going require [place] to perform compression wrapping today. 11-26-2022 upon evaluation today patient appears to be doing well currently in regard to his legs as far as infection is concerned I see nothing that appears to be infected. Fortunately I do not see any signs of active infection systemically either which is also good news. With that being said he still is extremely swollen as far as his legs  are concerned. I do not see any signs of overall worsening but also do not see any signs of significant improvement which is the major issue here. 12-03-2022 upon evaluation today patient appears to be doing poorly still in regard to his legs the apparently has been taken off the wraps and not leaving the week by week. With that being said he has not had really the dressings to put on the release that is running out and subsequently though he is using his wraps I am not sure that he has been keeping them on like he needs to. I explained to him I really wanted to have the wraps  that I put on him left on until he comes back to see me so that we can keep the compression under better control. He voiced understanding today he tells me that he was "confused about that". Nonetheless based on what I am seeing right now also think that he is up on his feet too much I think he needs to get the feet elevated. 7/24; this is a patient with severe bilateral stage III lymphedema. On the right lower leg medially is a large area of macerated denuded skin was too small deeper areas in this. On the left leg he is 2 areas that are a little more standard in terms of wounds. Massive lymphedema bilaterally. He has compression pumps at home which he uses for 1 hour twice a day 12-17-2022 upon evaluation today patient's legs though a little bit smaller still continue to have significant issues with weeping here this just does not dry. I really think he needs to be changing this daily and this means we will probably have to try to go back to the Velcro wraps and give this a shot. I think may be going to the Velcro wraps, changing the dressing to a superabsorber like XtraSorb or the like, and have him elevate his legs and do his pumps 3 times per day is probably going to be the way to go to try to see if we can make some improvement here. 12-24-2022 upon evaluation today patient unfortunately is still struggling to get anything under control. We have been extremely aggressive and trying to: Control his swelling everything we do however seems to be met with resistance. He tells me that he is pumping 3 times per day with his lymphedema pumps. He also tells me that he is try to elevate his legs is much as possible and that subsequently he has been keeping his compression wraps on. He goes back and forth between saying that he can keep our wraps on and not but right now I really do not see that he is doing well with the juxta fit is at least not from the standpoint of improving the overall appearance I think  it does to some degree help maintain but we are still struggling here. I have voiced a concern to the patient that despite everything you are doing I am still not seeing a lot of improvement here and to be honest I am not sure what is can take get this under control he may have to go into the hospital for admission. 12-31-2022 upon evaluation today patient appears to be doing well currently in regard to his wounds. They seem a little better although he has several pustules around the legs in general but has been concerned about infection. I am actually going to go ahead and see about getting an antibiotic sent into the pharmacy today  although I am going to obtain a PCR culture as well. 01-07-2023 upon evaluation today patient appears to be doing well currently in regard to his wound. Has been tolerating the dressing changes without complication. He actually seems to be doing much better. He did have a PCR culture which showed Serratia as well and the prominent organisms here. Levaquin was actually a good option to treat the Serratia along with the other organisms that were problematic including E. coli. Nonetheless I think coupled with the topical Keystone antibiotics and the alginate he is really doing quite well today. 8/28; patient with severe bilateral lymphedema he has wounds bilaterally on his lower legs but they look a lot better today. We are using silver alginate and Keystone with Urgo K2 lite wraps. He comes in today with his wounds looking quite good. He was prescribed Levaquin last time I think he will complete this in a few days. 01-21-2023 upon evaluation today patient appears to be doing worse in regard to his swelling took his wrap off he tells me he believes on Saturday. With that being said his leg is significantly swollen since that time and unfortunately does not appear to be doing nearly as good as it was last time I saw him 2 weeks ago or even last week for that matter. I am very  concerned about this. 01-28-2023 upon evaluation today patient appears to be doing poorly currently in regard to his legs. He has been doing everything he says that we have recommended, he continues to use his lymphedema pumps 3 times a day, he has been doubling up on his fluid pills, he has been elevating his legs, and he tells me that he also has kept the wraps on and in fact he did have them on the day when he came in. Again that for feels the compression side of things. Nonetheless with everything going on here we still have not been able to get this completely under control. He continues to have issues with significant swelling of lower extremities and this is not limited to just his lower left portion of his legs but his thighs are fairly large as well. With that being said I really feel like that we have reached the extent of what we have to offer here at the wound care center I am wondering if we can see about making a referral for second opinion to [facility] to see if there is anything they could do to help him out at this point. It is really not much further from his home to there as it is from his home to here. Both are about an hour distance. He lives in [place]. Mckibbin, [name] Huh ([id number]) [id number] Page 4 of 14 02-04-2023 upon evaluation today patient appears to be doing much better actually compared to last week's evaluation. This is actually significant improvement and overall I do not know exactly what he did different other than he tells me that he "has been taking his fluid pills every day instead of every other day. Obviously this is needed. I was under the understanding previously that he was taking this every day but I think he may also not been pumping every day like he was telling me but I cannot be sure this either way he does seem to be doing what he supposed to do at this point. 02-11-2023 upon evaluation today patient unfortunately  appears to be about as bad today as he appeared good last week. I am not sure  what happened other than the fact he did take his wrap off the right leg he tells me it got so tight that he could not feel his toes they are getting very numb. Subsequently he tells me that it was difficulty with driving therefore he took it off. With that being said I do believe that he may need to go to the ER for evaluation and treatment I feel he may need to be admitted to get some of the fluid off of his legs. I think that he is volume overloaded and at this point I do not think there is much working to be able to do to manage this outpatient we have tried pretty much everything that I am going to do. 02-18-23 upon evaluation today patient appears to be doing pretty well currently compared to last week this is actually better today. He is less weeping and the legs are actually significantly smaller. He tells me has been trying to stay in bed with his legs elevated more. 02-25-2023 upon evaluation today patient appears to be doing well currently in regard to his legs in general compared to where we have been. Fortunately I do not see any signs of active infection locally or systemically which is great news and in general I do believe that we are making good headway here towards complete closure. I am hopeful that he will continue to show signs of improvement as we progress going forward. He does seem to have been taking his fluid pills as well as elevating his legs and using lymphedema pumps on a regular basis. He is trying to stay out of the hospital. 03-04-2023 upon evaluation today patient appears to be doing well currently in regard to his wound. He has been tolerating the dressing changes without complication. Fortunately there does not appear to be any signs of active infection at this time. 03-11-2023 upon evaluation today patient appears to be doing well currently in regard to his wounds. He has been tolerating the  dressing changes without complication. Fortunately I do not see any signs of active infection at this time which is great news. 11/6; patient missed his clinic appointment last week. He took his wraps off at some point. Did not take his diuretics because he was nauseated last week. He comes in with massive bilateral edema which is lymphedema and probably some degree of systemic fluid overload. 11/13; patient with very poorly controlled lymphedema. We have been putting 4-layer equivalent wraps on him he says he is using his compression pumps twice a day. And elevating his legs nevertheless the edema control today is very poor. He has a substantial wound on the right medial lower leg also of the left medial and left posterior. We have been using alginate as a primary dressing or Aquacel Ag 11/20; some improvement in the amount of edema. We have been using 4-layer equivalent wraps and he is pumping with his external compression pumps twice a day. His legs had improved in terms of edema although there is still massively edematous. He tells me that he is on torsemide 1-1/2 tablets 20 mg tabs a day 12/4; massive edema extending up into his groin. Interestingly his edema in the thighs is more pitting then nonpitting lymphedema. His wounds are on both legs. This is not something that I think we can debride although I did do this last week I am not sure this did him any good. We have been using 4-layer compression he is using his pumps twice a  day. I misunderstood his torsemide dosage which apparently is 1 tablet alternating with a half a tablet daily. I think he probably needs more than that. Unfortunately he cannot get a hold of his cardiologist... 12/11 massive edema in the bilateral lower legs extending up into his posterior thighs towards his groin. He tells me he took his compression wrap off on Sunday. He says he is using his external compression pumps twice a day. He has wounds predominantly on the  right medial leg left lateral left anterior and left medial leg. The patient states he has tried to contact his cardiologist vis--vis diuretic use and he just cannot get a hold of anybody in the office Electronic Signature(s) Signed: 04/29/2023 5:18:28 PM By: Baltazar Najjar MD Entered By: Baltazar Najjar on 04/29/2023 13:37:57 -------------------------------------------------------------------------------- Physical Exam Details Patient Name: Date of Service: Dobbin, [name] 04/29/2023 3:00 PM Medical Record Number: [id number] Patient Account Number: [id number] Date of Birth/Sex: Treating RN: 07-Sep-1945 (77 y.o. M) Primary Care Provider: [name] Other Clinician: Referring Provider: Treating Provider/Extender: Valentina Gu Weeks in Treatment: 64 Constitutional Sitting or standing Blood Pressure is within target range for patient.. Pulse regular and within target range for patient.Marland Kitchen Respirations regular, non-labored and within target range.. Temperature is normal and within the target range for the patient.Marland Kitchen Appears in no distress. Notes Wound exam; the right medial lower leg left medial and left posterior. He seems to have additional areas on the left anterior. Absolutely massive edema nonpitting. His feet are warm although it is difficult to feel pulses through his edema fluid Electronic Signature(s) Signed: 04/29/2023 5:18:28 PM By: Baltazar Najjar MD Entered By: Baltazar Najjar on 04/29/2023 13:39:25 Capili, [name] Huh ([id number]) [id number] Page 5 of 14 -------------------------------------------------------------------------------- Physician Orders Details Patient Name: Date of Service: Steppe, [name] 04/29/2023 3:00 PM Medical Record Number: [id number] Patient Account Number: [id number] Date of Birth/Sex: Treating RN: 1946/03/03 (77 y.o. Lucious Groves Primary Care Provider: [name] Other Clinician: Referring  Provider: Treating Provider/Extender: Valentina Gu Weeks in Treatment: 52 Verbal / Phone Orders: No Diagnosis Coding Follow-up Appointments ppointment in 1 week. - Dr. Leanord Hawking Return A Other: - take your fluid pills as prescribed. Speak with cardiologist about increasing fluid pills Anesthetic (In clinic) Topical Lidocaine 4% applied to wound bed Bathing/ Shower/ Hygiene May shower with protection but do not get wound dressing(s) wet. Protect dressing(s) with water repellant cover (for example, large plastic bag) or a cast cover and may then take shower. Edema Control - Orders / Instructions Segmental Compressive Device. Use the Segmental Compressive Device on leg(s) 2-3 times a day for 45 - 60 minutes. If wearing any wraps or hose, do not remove them. Continue exercising as instructed. - x3 a day Elevate legs to the level of the heart or above for 30 minutes daily and/or when sitting for 3-4 times a day throughout the day. Avoid standing for long periods of time. Exercise regularly Off-Loading Other: - When sitting please keep your feet up, keep legs at Heart Level or above. Use pillows behind calf to for comfort. Additional Orders / Instructions Follow Nutritious Diet - Try and increase protein intake. Goal of protein intake is 60g-100g. Lymphedema Treatment Plan - Exercise, Compression and Elevation Bilateral Lower Extremities Exercise daily as tolerated. (Walking, ROM, Calf Pumps and Toe Taps) Elevate legs 30 - 60 minutes at or above heart level at least 3 - 4 times daily as able/tolerated Avoid standing for long periods and elevate leg(s)  parallel to the floor when sitting Use Pneumatic Compression Device on leg(s) 2-3 times a day for 45-60 minutes - x3 a day Wound Treatment Wound #12 - Lower Leg Wound Laterality: Left, Anterior Cleanser: Soap and Water 1 x Per Week/30 Days Discharge Instructions: May shower and wash wound with dial antibacterial soap and  water prior to dressing change. Peri-Wound Care: Sween Lotion (Moisturizing lotion) 1 x Per Week/30 Days Discharge Instructions: Apply moisturizing lotion as directed Prim Dressing: Maxorb Extra CMC/Alginate Dressing, 4x4 (in/in) 1 x Per Week/30 Days ary Discharge Instructions: Apply to wound bed as instructed Secondary Dressing: ABD Pad, 8x10 1 x Per Week/30 Days Discharge Instructions: Apply over primary dressing as directed. Secondary Dressing: Zetuvit Plus 4x8 in 1 x Per Week/30 Days Discharge Instructions: Apply over primary dressing as directed. Secondary Dressing: OptiLock Super Absorbent, 5x5.5 (in/in) (Generic) 1 x Per Week/30 Days Discharge Instructions: Apply directly to wound bed as directed Compression Wrap: Urgo K2, (equivalent to a 4 layer) two layer compression system, regular 1 x Per Week/30 Days Discharge Instructions: Apply Urgo K2 as directed (alternative to 4 layer compression). Wound #15 - Lower Leg Wound Laterality: Left, Posterior, Distal Cleanser: Soap and Water 1 x Per Week/30 Days Discharge Instructions: May shower and wash wound with dial antibacterial soap and water prior to dressing change. Peri-Wound Care: Sween Lotion (Moisturizing lotion) 1 x Per Week/30 Days Brigandi, Ricky ([id number]) [id number] Page 6 of 14 Discharge Instructions: Apply moisturizing lotion as directed Prim Dressing: Maxorb Extra CMC/Alginate Dressing, 4x4 (in/in) 1 x Per Week/30 Days ary Discharge Instructions: Apply to wound bed as instructed Secondary Dressing: ABD Pad, 8x10 1 x Per Week/30 Days Discharge Instructions: Apply over primary dressing as directed. Secondary Dressing: Zetuvit Plus 4x8 in 1 x Per Week/30 Days Discharge Instructions: Apply over primary dressing as directed. Secondary Dressing: OptiLock Super Absorbent, 5x5.5 (in/in) (Generic) 1 x Per Week/30 Days Discharge Instructions: Apply directly to wound bed as directed Compression Wrap:  Urgo K2, (equivalent to a 4 layer) two layer compression system, regular 1 x Per Week/30 Days Discharge Instructions: Apply Urgo K2 as directed (alternative to 4 layer compression). Wound #16 - Lower Leg Wound Laterality: Left, Lateral Cleanser: Soap and Water 1 x Per Week/30 Days Discharge Instructions: May shower and wash wound with dial antibacterial soap and water prior to dressing change. Peri-Wound Care: Sween Lotion (Moisturizing lotion) 1 x Per Week/30 Days Discharge Instructions: Apply moisturizing lotion as directed Prim Dressing: Maxorb Extra CMC/Alginate Dressing, 4x4 (in/in) 1 x Per Week/30 Days ary Discharge Instructions: Apply to wound bed as instructed Secondary Dressing: ABD Pad, 8x10 1 x Per Week/30 Days Discharge Instructions: Apply over primary dressing as directed. Secondary Dressing: Zetuvit Plus 4x8 in 1 x Per Week/30 Days Discharge Instructions: Apply over primary dressing as directed. Secondary Dressing: OptiLock Super Absorbent, 5x5.5 (in/in) (Generic) 1 x Per Week/30 Days Discharge Instructions: Apply directly to wound bed as directed Compression Wrap: Urgo K2, (equivalent to a 4 layer) two layer compression system, regular 1 x Per Week/30 Days Discharge Instructions: Apply Urgo K2 as directed (alternative to 4 layer compression). Wound #6 - Lower Leg Wound Laterality: Right, Medial Cleanser: Soap and Water 1 x Per Week/30 Days Discharge Instructions: May shower and wash wound with dial antibacterial soap and water prior to dressing change. Peri-Wound Care: Sween Lotion (Moisturizing lotion) 1 x Per Week/30 Days Discharge Instructions: Apply moisturizing lotion as directed Prim Dressing: Maxorb Extra CMC/Alginate Dressing, 4x4 (in/in) 1 x Per Week/30 Days ary Discharge Instructions:  Apply to wound bed as instructed Secondary Dressing: ABD Pad, 8x10 1 x Per Week/30 Days Discharge Instructions: Apply over primary dressing as directed. Secondary Dressing: Zetuvit  Plus 4x8 in 1 x Per Week/30 Days Discharge Instructions: Apply over primary dressing as directed. Secondary Dressing: OptiLock Super Absorbent, 5x5.5 (in/in) (Generic) 1 x Per Week/30 Days Discharge Instructions: Apply directly to wound bed as directed Compression Wrap: Urgo K2, (equivalent to a 4 layer) two layer compression system, regular 1 x Per Week/30 Days Discharge Instructions: Apply Urgo K2 as directed (alternative to 4 layer compression). Wound #7 - Lower Leg Wound Laterality: Left, Medial Cleanser: Soap and Water 1 x Per Week/30 Days Discharge Instructions: May shower and wash wound with dial antibacterial soap and water prior to dressing change. Peri-Wound Care: Sween Lotion (Moisturizing lotion) 1 x Per Week/30 Days Discharge Instructions: Apply moisturizing lotion as directed Prim Dressing: Maxorb Extra CMC/Alginate Dressing, 4x4 (in/in) 1 x Per Week/30 Days ary Discharge Instructions: Apply to wound bed as instructed Secondary Dressing: ABD Pad, 8x10 1 x Per Week/30 Days Discharge Instructions: Apply over primary dressing as directed. Secondary Dressing: Zetuvit Plus 4x8 in 1 x Per Week/30 Days Discharge Instructions: Apply over primary dressing as directed. [name] [name] Huh ([id number]) [id number] Page 7 of 14 Secondary Dressing: OptiLock Super Absorbent, 5x5.5 (in/in) (Generic) 1 x Per Week/30 Days Discharge Instructions: Apply directly to wound bed as directed Compression Wrap: Urgo K2, (equivalent to a 4 layer) two layer compression system, regular 1 x Per Week/30 Days Discharge Instructions: Apply Urgo K2 as directed (alternative to 4 layer compression). Electronic Signature(s) Signed: 04/29/2023 4:17:42 PM By: Fonnie Mu RN Signed: 04/29/2023 5:18:28 PM By: Baltazar Najjar MD Entered By: Fonnie Mu on 04/29/2023 13:17:40 -------------------------------------------------------------------------------- Problem List  Details Patient Name: Date of Service: [place], [name] 04/29/2023 3:00 PM Medical Record Number: [id number] Patient Account Number: [id number] Date of Birth/Sex: Treating RN: August 10, 1945 (77 y.o. M) Primary Care Provider: [name] Other Clinician: Referring Provider: Treating Provider/Extender: Valentina Gu Weeks in Treatment: 45 Active Problems ICD-10 Encounter Code Description Active Date MDM Diagnosis I87.333 Chronic venous hypertension (idiopathic) with ulcer and inflammation of 06/11/2022 No Yes bilateral lower extremity I89.0 Lymphedema, not elsewhere classified 03/26/2022 No Yes L97.828 Non-pressure chronic ulcer of other part of left lower leg with other specified 03/26/2022 No Yes severity L97.818 Non-pressure chronic ulcer of other part of right lower leg with other specified 03/26/2022 No Yes severity Inactive Problems ICD-10 Code Description Active Date Inactive Date L84 Corns and callosities 10/15/2022 10/15/2022 Resolved Problems Electronic Signature(s) Signed: 04/29/2023 5:18:28 PM By: Baltazar Najjar MD Entered By: Baltazar Najjar on 04/29/2023 13:35:38 Ficken, [name] Huh ([id number]) [id number] Page 8 of 14 -------------------------------------------------------------------------------- Progress Note Details Patient Name: Date of Service: Rosenboom, [name] 04/29/2023 3:00 PM Medical Record Number: [id number] Patient Account Number: [id number] Date of Birth/Sex: Treating RN: [birth year] (77 y.o. M) Primary Care Provider: [name] Other Clinician: Referring Provider: Treating Provider/Extender: Valentina Gu Weeks in Treatment: 66 Subjective History of Present Illness (HPI) ADMISSION 02/23/2020 Patient is a 77 year old man who lives in [place] who arrives accompanied by his wife. He has a history of chronic lymphedema and venous insufficiency in his bilateral lower legs which may have  something to do that with having a history of DVT as well as being treated for prostate cancer. In any case he recently got compression pumps at home but compliance has been an issue here. He has compression stockings however they are probably not  sufficient enough to control swelling. They tell us that things deteriorated for him in late August he was admitted to [facility] for 7 days. This was with cellulitis I think of his bilateral lower legs. Discharge he was noted to have wounds on his bilateral lower legs. He was discharged on Bactrim. They tried to get him home health through [facility] of course they declined him. His wife is been wrapping these applying some form of silver foam dressing. He has a history of wounds before although nothing that would not heal with basic home topical dressings. He has 2 areas on the left medial, left anterior and left lateral and a smaller area on the right medial. All of these have considerable depth. Past medical history includes iron deficiency anemia, lymphedema followed by the rehab center at [facility] with lymphedema wraps I believe, DVT on chronic anticoagulation, prostate cancer, chronic venous insufficiency, hypertension. As mentioned he has compression pumps but does not use them. ABIs in our clinic were noncompressible bilaterally 10/14; patient with severe bilateral lymphedema right greater than left. He came in with bilateral lower extremity wounds left greater than right. Even though the right side has more of the edema most of the wounds here almost closed on the right medial. He has 3 remaining wounds on the left We have been using silver alginate under 4-layer compression I have been trying to get him to be compliant with his external compression pumps 10/21; patient with 3 small wounds on the left leg and 1 on the right medial in the setting of severe lymphedema and chronic venous insufficiency. We have been using silver  alginate under 4-layer compression he is using his external compression pumps twice a day 11/4; ARTERIAL STUDIES on the right show an ABI of 1.02 TBI of 0.858 with biphasic waveforms on the left 0.98 with a TBI of 0.55 and biphasic waveforms. Does not look like he has significant arterial disease. We are treating him for lymphedema he has compression pumps. He has punched-out areas on the left anterior left lateral and right medial lower extremities 11/11; after we obtained his arterial studies I put him in 4 layer compression. He is using his compression pumps probably once a day although I have asked him to do twice. Primary dressing to the wound is silver collagen he has severe lymphedema likely secondary to chronic venous insufficiency. Wounds on the left lateral, left medial and left anterior and a small area on the right medial 12/2; the area on the right anterior lower leg has healed. We initially thought that the area medially had healed as well however when her discharge nurse came in she detected fluid in the wound simply opened up. This is actually worse than I remember this pain. The area on the left lateral potentially slightly smaller He is also complaining about pain in his left hand he says that this is actually been getting some better he has been using topical creams on this. She asked that I look at this 12/9 after last weeks issues we have 2 wounds one on the right medial lower leg and 1 on the left lateral. Both of these are in the same condition. I think because of thickened skin secondary to chronic lymphedema these wounds actually have depth of almost 0.8 cm. 12/16; the patient has 2 small but deep wounds one on the right medial and one on the left lateral. The right medial is actually the worst of these.  He arrives in clinic today with absolutely terrible edema in the right leg apparently his 4-layer wrap fell down to just above his ankle he did not think about this he  is apparently been continuing to use his compression pump twice a day. The left leg looks a lot better. 05/09/2020 upon evaluation today patient appears to be doing decently well in regard to his wounds. Everything is measuring smaller the right leg still has a little bit deeper wound in the left seems to be almost completely healed in my opinion I am very pleased in general with how things are progressing. He has a 4- layer compression wrap we have been using endoform today we will probably have to use collagen just based on the fact that we do not have endoform it is on order. 1/6; the patient's wound on the left lateral lower leg has healed. Still has 1 on the right medial. 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 Using compression pumps at home twice a day. 1/13; left lateral lower leg is still healed. He has a deep punched out rectangular shaped wound on the right medial calf. Looking down at this it appears that he is attempting to epithelialize around the edges of the wound and on the base as well. His edema is reasonably well controlled we have been using collagen with absolutely no effect 1/20; left lateral lower leg remains closed he has extremitease stockings. The area on the right medial calf I aggressively debrided last week measures larger but the surface looks better. We have been using Hydrofera Blue. We ran Oasis through his insurance but we have not seen the results of this 1/27; left lower leg wound with chronic venous insufficiency and secondary lymphedema. I did aggressive debridement on this last week the wound seems to have come in healthy looking surface using Hydrofera Blue. He was denied for [place] 2/3; small divot in the right medial lower leg. Under illumination the walls of this divot are epithelialized however the base has slough which I removed with a curette we have been using Hydrofera Blue 2/10 small divot on the right medial lower leg pinpoint  illumination at the base of this cone-shaped wound. We have been using Hydrofera Blue but I will switch to calcium alginate this week 2/17; the small divot on the right medial lower leg is fully epithelialized. There is no visible open area under illumination. He has his own stocking for the right leg similar to the one he has been wearing on the left. 03/26/2022; READMISSION This is a now 77 year old man that we had in the clinic from 02/23/2020 through 07/05/2020. At that point he had bilateral lower extremity wounds left greater than right in the setting of severe lymphedema. He had already obtained compression pumps ordered for him I think from the wound care clinic in [facility] [place], [place] ([id number]) [id number] Page 9 of 14 [place] so I do not really have record of what he has been using. He claims to be using them once a day but there is a problem with the sleeve on the left leg. About 2 weeks ago he was hospitalized from 03/11/2022 through 03/14/2022 with diastolic congestive heart failure. 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 He was diuresed. Developed some prerenal azotemia and he has not been taking any diuretics currently. He has not been putting stockings on his legs since he got out of hospital and still has his  legs dependent for long periods. Past medical history history of prostate cancer treated with prostatectomy and radiation this was apparently about 8 years ago, history of DVT on chronic Coumadin, history of lymphedema was managed for a while at the clinic in [place]. History of inguinal hernia repair in September 22, hypertension, stage IIIb chronic renal failure ABIs today were noncompressible on the right 1.12 on the left 04-02-2022 upon evaluation today patient appears to be doing well currently in regard to his legs I do feel like both areas that are draining are actually much drier  than they were in the picture last week although the left is drier than the right. He is tolerating the 4-layer compression wraps at this point he did contact the pump company and they are actually working on getting him a new compression sleeve for one of his legs which have previously popped and was not functioning properly. 04-23-2022 upon evaluation today patient appears to be doing well currently in regard to his wounds on the legs. I am actually very pleased with where things stand and I do feel like that we are headed in the right direction. Fortunately there is no sign of active infection locally or systemically at this time. 05-07-2022 upon evaluation today patient appears to be doing well currently in regard to his wounds in fact things are showing signs of improvement which is good news I do not see too much that actually appears to be open and I am very pleased in that regard. No fevers, chills, nausea, vomiting, or diarrhea. 05-21-2022 upon evaluation today patient appears to be doing somewhat poorly in regard to drainage of his lower extremities bilaterally. The right is greater than left as far as the weeping area. Nonetheless it seems to be getting worse not better. He actually has pitting edema which is at least 2+ to the thighs and I am concerned about the fact that he is may be fluid overloaded in general and that is the reason why we cannot get this under control. I know he is not using his pumps all the time because he actually told the nurse that he was either going to pump or he was going to use his fluid pills but not do both. For that reason I do think that he needs to be really doing both in order to get the fluid out as effectively as possible obviously with the 4-layer compression wraps were doing as much as we can from a compression standpoint but it is really not enough. He tells me that he elevates his leg is much as he can in between pumping and other activity throughout the  day. 05-28-2022 upon evaluation today patient appears to be doing better in regard to his wounds although the measurements may be a little bit larger this is a very difficult wound to heal it is very indistinct in a lot of areas. Nonetheless there is can be some need for sharp debridement in regard to both medial and lateral legs. Fortunately I see no signs of active infection locally nor systemically at this time. No fevers, chills, nausea, vomiting, or diarrhea. 06-04-2022 upon evaluation today patient appears to be doing poorly in general in regard to the wounds on his legs. He still continues to have a tremendous amount of fluid not just in the lower portion of his leg but to be honest his thigh where he has 2-3+ pitting edema in the thigh as well. Unfortunately I do not know that we will  be able to get this healed effectively and keep it healed on the lower extremities unless he gets the overall fluid situation taking and under control. Fortunately I do not see any signs of infection locally nor systemically which is great news. He just seems to be very fluid overloaded. 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 He has been tolerating the dressing changes with the compression wraps without complication. Fortunately I do not see any evidence of infection at this time which is great news. No fevers, chills, nausea, vomiting, or diarrhea. 06-18-2022 upon evaluation today patient appears to be doing well currently in regard to his wounds as far as not looking like they are terribly infected but nonetheless I am concerned about a subacute infection secondary to the fact that he continues to have spreading despite the compression therapy. We actually did do an Unna boot on him last week this is actually the first wrap that actually stayed up everything else has been sliding down quite  significantly. Fortunately there does not appear to be any signs of infection systemically at this time. With that being said I do believe that locally there seems to be an issue going on here and again I Ernie Hew do a PCR culture to see what that shows also think that I am going to put him on a broad-spectrum antibiotic, doxycycline to see how that will help as well. He does tell me that coming into the clinic today that he was feeling short of breath like "he was about to have a heart attack" because he was having such a hard time breathing. He says that he told this to Dr. Jodelle Green his cardiologist as well when he was evaluated in the past 1 to 2 weeks. 07-02-2022 upon evaluation today patient appears to be doing poorly currently in regard to his wound. He has been tolerating the dressing changes. Unfortunately he has not had any compression wraps on for the past week because he was unable to make it in for his appointment last week. With that being said he has a significant amount of drainage he tells me has been using his pumps but despite this in the pumps he still has been draining quite a bit. The drainage is also somewhat purulent unfortunately. We did attempt to get in touch with his cardiologist last week unfortunately we were unable to get up with him I did advise that the patient needs to get in touch with him upon leaving today in order to make sure they know he is on the new antibiotics I am going to send him this will be Levaquin and Augmentin. 07-09-2022 upon evaluation today patient appears to be doing about the same in regard to his legs he may have just a slight amount of improvement with regard to the drainage probably Keystone topical antibiotics are helping in this regard to some degree. Fortunately there does not appear to be any signs of active infection systemically which is great news. No fevers, chills, nausea, vomiting, or diarrhea. 07-16-2022 upon evaluation today patient appears  to be doing well currently in regard to his wound. He has been tolerating the dressing changes without complication. Fortunately there does not appear to be any signs of active infection locally nor systemically at this time. With that being said he cannot keep the wraps up he tells me on the left side he had to cut this down because it got too  tight. He has been using his pumps but he is on the right side the wrap actually straight down causing some pushing around the central part of his leg just below the calf I think this is a bigger risk for him that help at this point. I think that we may need to try something different he should be getting his compression socks shortly he tells me they were ordered last Thursday. 3/6; ; this is a patient who lives in [place]. He has severe bilateral lymphedema. He has compression pumps, we have been using kerlix Ace wrap Keystone. He is changing the dressing. We do not have home health. 08-06-2022 upon evaluation today patient appears to be doing a little better in regard to his wounds in general at this point. Fortunately there does not appear to be any signs of active infection locally nor systemically at this time which is great news and overall I am extremely pleased with where we stand today. 08-13-2022 upon evaluation today patient appears to actually be doing significantly better compared to last week. He actually did go to the hospital I told him that he needed to when he left here and he actually did go. With that being said they actually ended up admitting him he was having shortness of breath and I thought it might be related to congestive heart failure turns out he actually had a pulmonary embolism. Subsequently they were able to get him off of the Coumadin switching over to Eliquis to get things stabilized in that regard they also had them wrapped and got his swelling under control on his legs he actually looks much better pretty much across the board  at this point. I am very pleased in that regard. With that being said I am very happy that he finally went that could have been a very dangerous situation. 08-20-2022 upon evaluation today patient appears to be doing well currently in regard to his wound. Has been tolerating the dressing changes without complication. Fortunately there does not appear to be any signs of active infection locally nor systemically at this time. I think his legs are doing better there is some need for sharp debridement today. 08-27-2022 upon evaluation patient is actually making excellent progress. I am actually very pleased with where he stands and I think that he is moving in the right direction. In general I think that we are looking pretty good at the moment. 09-03-2022 upon evaluation today patient appears to be doing well currently in regard to his wound. He is actually tolerating dressing changes on the left and [name] [name] ([id number]) [id number] Page 10 of 14 right leg without complication. Fortunately I do not see any need for debridement of the left leg the right leg I think we probably do some need to perform some debridement here. 09-10-2022 upon evaluation today patient's wounds actually showed signs of improvement in both legs I do not see much is going require debridement today which was great news. Fortunately I do not see any evidence of infection which I think is also excellent he seems to be using his pumps and doing everything right I am happy about how this is progressing at this point. 09-17-2022 upon evaluation today patient appears to be doing decently well in regard to his wounds. He has been tolerating the dressing changes without complication. Fortunately there does not appear to be any signs of active infection at this time which is good news. 09-24-2022 upon evaluation today patient appears to be doing  well currently in regard to his wounds. He has been tolerating the  dressing changes without complication. Fortunately there does not appear to be any signs of active infection locally nor systemically which is great news. No fevers, chills, nausea, vomiting, or diarrhea. 10-08-2022 upon evaluation today patient appears to be doing excellent currently in regard to his wound. He has been tolerating the dressing changes without complication though it does not sound like he has been using his compression wraps for a bit here. He does think he was doing better with the [facility] topical antibiotics we can definitely go back to using that but I think the biggest issue here is that his swelling is just very out of control and needs to be under control. I discussed that with him today. 10-15-2022 upon evaluation today patient appears to be doing well currently in regard to his wounds. He is actually making some progress which is good news. Fortunately I do not see any evidence of active infection locally nor systemically which is great news as well. No fevers, chills, nausea, vomiting, or diarrhea. He does have a callused area on the plantar aspect of his left foot which is actually causing some pain and he wonders if I can trim this down for him. 10-22-2022 upon evaluation today patient seems to be making progress. He is actually doing quite well and very pleased in that regard. I do not see any signs of active infection at this time. 11-05-2022 upon evaluation patient appears to be making progress although slowly towards closure. He seems to be doing well with regard to his legs were still using the [facility] topical antibiotics and he seems to be doing quite well. He is going require some sharp debridement today. 11-19-2022 upon evaluation today patient unfortunately appears to be extremely swollen at this point. He tells me that he ran out of supplies he also tells me his leg started leaking more because of not having supplies he was unable to wear his compression wraps that is  the juxta fit compression wraps. Therefore his legs are extremely swollen much larger than normal and do not appear to be doing well at all today. He is going require some debridement I also think he is going require [place] to perform compression wrapping today. 11-26-2022 upon evaluation today patient appears to be doing well currently in regard to his legs as far as infection is concerned I see nothing that appears to be infected. Fortunately I do not see any signs of active infection systemically either which is also good news. With that being said he still is extremely swollen as far as his legs are concerned. I do not see any signs of overall worsening but also do not see any signs of significant improvement which is the major issue here. 12-03-2022 upon evaluation today patient appears to be doing poorly still in regard to his legs the apparently has been taken off the wraps and not leaving the week by week. With that being said he has not had really the dressings to put on the release that is running out and subsequently though he is using his wraps I am not sure that he has been keeping them on like he needs to. I explained to him I really wanted to have the wraps that I put on him left on until he comes back to see me so that we can keep the compression under better control. He voiced understanding today he tells me that he was "confused about  that". Nonetheless based on what I am seeing right now also think that he is up on his feet too much I think he needs to get the feet elevated. 7/24; this is a patient with severe bilateral stage III lymphedema. On the right lower leg medially is a large area of macerated denuded skin was too small deeper areas in this. On the left leg he is 2 areas that are a little more standard in terms of wounds. Massive lymphedema bilaterally. He has compression pumps at home which he uses for 1 hour twice a day 12-17-2022 upon evaluation today patient's legs though a  little bit smaller still continue to have significant issues with weeping here this just does not dry. I really think he needs to be changing this daily and this means we will probably have to try to go back to the Velcro wraps and give this a shot. I think may be going to the Velcro wraps, changing the dressing to a superabsorber like XtraSorb or the like, and have him elevate his legs and do his pumps 3 times per day is probably going to be the way to go to try to see if we can make some improvement here. 12-24-2022 upon evaluation today patient unfortunately is still struggling to get anything under control. We have been extremely aggressive and trying to: Control his swelling everything we do however seems to be met with resistance. He tells me that he is pumping 3 times per day with his lymphedema pumps. He also tells me that he is try to elevate his legs is much as possible and that subsequently he has been keeping his compression wraps on. He goes back and forth between saying that he can keep our wraps on and not but right now I really do not see that he is doing well with the juxta fit is at least not from the standpoint of improving the overall appearance I think it does to some degree help maintain but we are still struggling here. I have voiced a concern to the patient that despite everything you are doing I am still not seeing a lot of improvement here and to be honest I am not sure what is can take get this under control he may have to go into the hospital for admission. 12-31-2022 upon evaluation today patient appears to be doing well currently in regard to his wounds. They seem a little better although he has several pustules around the legs in general but has been concerned about infection. I am actually going to go ahead and see about getting an antibiotic sent into the pharmacy today although I am going to obtain a PCR culture as well. 01-07-2023 upon evaluation today patient appears to  be doing well currently in regard to his wound. Has been tolerating the dressing changes without complication. He actually seems to be doing much better. He did have a PCR culture which showed Serratia as well and the prominent organisms here. Levaquin was actually a good option to treat the Serratia along with the other organisms that were problematic including E. coli. Nonetheless I think coupled with the topical Keystone antibiotics and the alginate he is really doing quite well today. 8/28; patient with severe bilateral lymphedema he has wounds bilaterally on his lower legs but they look a lot better today. We are using silver alginate and Keystone with Urgo K2 lite wraps. He comes in today with his wounds looking quite good. He was prescribed Levaquin last time  I think he will complete this in a few days. 01-21-2023 upon evaluation today patient appears to be doing worse in regard to his swelling took his wrap off he tells me he believes on Saturday. With that being said his leg is significantly swollen since that time and unfortunately does not appear to be doing nearly as good as it was last time I saw him 2 weeks ago or even last week for that matter. I am very concerned about this. 01-28-2023 upon evaluation today patient appears to be doing poorly currently in regard to his legs. He has been doing everything he says that we have recommended, he continues to use his lymphedema pumps 3 times a day, he has been doubling up on his fluid pills, he has been elevating his legs, and he tells me that he also has kept the wraps on and in fact he did have them on the day when he came in. Again that for feels the compression side of things. Nonetheless with everything going on here we still have not been able to get this completely under control. He continues to have issues with significant swelling of lower extremities and this is not limited to just his lower left portion of his legs but his thighs are  fairly large as well. With that being said I really feel like that we have reached the extent of what we have to offer here at the wound care center I am wondering if we can see about making a referral for second opinion to [facility] to see if there is anything they could do to help him out at this point. It is really not much further from his home to there as it is from his home to here. Both are about an hour distance. He lives in [place]. 02-04-2023 upon evaluation today patient appears to be doing much better actually compared to last week's evaluation. This is actually significant improvement and overall I do not know exactly what he did different other than he tells me that he "has been taking his fluid pills every day instead of every other day. Obviously this is needed. I was under the understanding previously that he was taking this every day but I think he may also not been pumping every day like he was telling me but I cannot be sure this either way he does seem to be doing what he supposed to do at this point. [name] [name] Huh ([id number]) [id number] Page 11 of 14 02-11-2023 upon evaluation today patient unfortunately appears to be about as bad today as he appeared good last week. I am not sure what happened other than the fact he did take his wrap off the right leg he tells me it got so tight that he could not feel his toes they are getting very numb. Subsequently he tells me that it was difficulty with driving therefore he took it off. With that being said I do believe that he may need to go to the ER for evaluation and treatment I feel he may need to be admitted to get some of the fluid off of his legs. I think that he is volume overloaded and at this point I do not think there is much working to be able to do to manage this outpatient we have tried pretty much everything that I am going to do. 02-18-23 upon evaluation today patient appears to be  doing pretty well currently compared to last week this is actually better today.  He is less weeping and the legs are actually significantly smaller. He tells me has been trying to stay in bed with his legs elevated more. 02-25-2023 upon evaluation today patient appears to be doing well currently in regard to his legs in general compared to where we have been. Fortunately I do not see any signs of active infection locally or systemically which is great news and in general I do believe that we are making good headway here towards complete closure. I am hopeful that he will continue to show signs of improvement as we progress going forward. He does seem to have been taking his fluid pills as well as elevating his legs and using lymphedema pumps on a regular basis. He is trying to stay out of the hospital. 03-04-2023 upon evaluation today patient appears to be doing well currently in regard to his wound. He has been tolerating the dressing changes without complication. Fortunately there does not appear to be any signs of active infection at this time. 03-11-2023 upon evaluation today patient appears to be doing well currently in regard to his wounds. He has been tolerating the dressing changes without complication. Fortunately I do not see any signs of active infection at this time which is great news. 11/6; patient missed his clinic appointment last week. He took his wraps off at some point. Did not take his diuretics because he was nauseated last week. He comes in with massive bilateral edema which is lymphedema and probably some degree of systemic fluid overload. 11/13; patient with very poorly controlled lymphedema. We have been putting 4-layer equivalent wraps on him he says he is using his compression pumps twice a day. And elevating his legs nevertheless the edema control today is very poor. He has a substantial wound on the right medial lower leg also of the left medial and left posterior. We have  been using alginate as a primary dressing or Aquacel Ag 11/20; some improvement in the amount of edema. We have been using 4-layer equivalent wraps and he is pumping with his external compression pumps twice a day. His legs had improved in terms of edema although there is still massively edematous. He tells me that he is on torsemide 1-1/2 tablets 20 mg tabs a day 12/4; massive edema extending up into his groin. Interestingly his edema in the thighs is more pitting then nonpitting lymphedema. His wounds are on both legs. This is not something that I think we can debride although I did do this last week I am not sure this did him any good. We have been using 4-layer compression he is using his pumps twice a day. I misunderstood his torsemide dosage which apparently is 1 tablet alternating with a half a tablet daily. I think he probably needs more than that. Unfortunately he cannot get a hold of his cardiologist... 12/11 massive edema in the bilateral lower legs extending up into his posterior thighs towards his groin. He tells me he took his compression wrap off on Sunday. He says he is using his external compression pumps twice a day. He has wounds predominantly on the right medial leg left lateral left anterior and left medial leg. The patient states he has tried to contact his cardiologist vis--vis diuretic use and he just cannot get a hold of anybody in the office Objective Constitutional Sitting or standing Blood Pressure is within target range for patient.. Pulse regular and within target range for patient.Marland Kitchen Respirations regular, non-labored and within target range.. Temperature is normal  and within the target range for the patient.Marland Kitchen Appears in no distress. Vitals Time Taken: 4:20 PM, Height: 74 in, Weight: 250 lbs, BMI: 32.1, Temperature: 97.8 F, Pulse: 99 bpm, Respiratory Rate: 17 breaths/min, Blood Pressure: 169/73 mmHg. General Notes: Wound exam; the right medial lower leg left medial and  left posterior. He seems to have additional areas on the left anterior. Absolutely massive edema nonpitting. His feet are warm although it is difficult to feel pulses through his edema fluid Integumentary (Hair, Skin) Wound #12 status is Open. Original cause of wound was Frostbite. The date acquired was: 12/17/2022. The wound has been in treatment 19 weeks. The wound is located on the Left,Anterior Lower Leg. The wound measures 6.5cm length x 6cm width x 0.1cm depth; 30.631cm^2 area and 3.063cm^3 volume. There is Fat Layer (Subcutaneous Tissue) exposed. There is no tunneling or undermining noted. There is a large amount of serous drainage noted. The wound margin is distinct with the outline attached to the wound base. There is small (1-33%) red, pink granulation within the wound bed. There is a large (67-100%) amount of necrotic tissue within the wound bed including Adherent Slough. The periwound skin appearance exhibited: Hemosiderin Staining. The periwound skin appearance did not exhibit: Callus, Crepitus, Excoriation, Induration, Rash, Scarring, Dry/Scaly, Maceration, Atrophie Blanche, Cyanosis, Ecchymosis, Mottled, Pallor, Rubor, Erythema. Periwound temperature was noted as No Abnormality. Wound #15 status is Open. Original cause of wound was Gradually Appeared. The date acquired was: 01/14/2023. The wound has been in treatment 10 weeks. The wound is located on the Left,Distal,Posterior Lower Leg. The wound measures 1cm length x 1.5cm width x 0.1cm depth; 1.178cm^2 area and 0.118cm^3 volume. There is Fat Layer (Subcutaneous Tissue) exposed. There is no tunneling or undermining noted. There is a medium amount of serosanguineous drainage noted. The wound margin is distinct with the outline attached to the wound base. There is small (1-33%) pink granulation within the wound bed. There is a large (67-100%) amount of necrotic tissue within the wound bed including Adherent Slough. The periwound skin  appearance exhibited: Hemosiderin Staining. The periwound skin appearance did not exhibit: Callus, Crepitus, Excoriation, Induration, Rash, Scarring, Dry/Scaly, Maceration, Atrophie Blanche, Cyanosis, Ecchymosis, Mottled, Pallor, Rubor, Erythema. Wound #16 status is Open. Original cause of wound was Blister. The date acquired was: 04/01/2023. The wound has been in treatment 4 weeks. The wound is located on the Left,Lateral Lower Leg. The wound measures 6cm length x 6cm width x 0.1cm depth; 28.274cm^2 area and 2.827cm^3 volume. There is Fat Layer (Subcutaneous Tissue) exposed. There is no tunneling or undermining noted. There is a medium amount of serous drainage noted. The wound margin is distinct with the outline attached to the wound base. There is small (1-33%) red granulation within the wound bed. There is a large (67-100%) amount of necrotic tissue within the wound bed including Adherent Slough. The periwound skin appearance had no abnormalities noted for texture. The periwound skin appearance had no abnormalities noted for moisture. The periwound skin appearance exhibited: Hemosiderin Staining. The periwound skin appearance did not exhibit: Atrophie Blanche, Cyanosis, Ecchymosis, Mottled, Pallor, Rubor, Erythema. Periwound temperature was noted as No Abnormality. Wound #6 status is Open. Original cause of wound was Gradually Appeared. The date acquired was: 03/05/2022. The wound has been in treatment 57 weeks. [name] [name] Huh ([id number]) [id number] Page 12 of 14 The wound is located on the Right,Medial Lower Leg. The wound measures 10.4cm length x 8cm width x 0.3cm depth; 65.345cm^2 area and 19.604cm^3 volume. There is Fat Layer (  Subcutaneous Tissue) exposed. There is no tunneling or undermining noted. There is a large amount of serous drainage noted. The wound margin is distinct with the outline attached to the wound base. There is small (1-33%) pink granulation  within the wound bed. There is a large (67-100%) amount of necrotic tissue within the wound bed including Adherent Slough. The periwound skin appearance exhibited: Scarring, Hemosiderin Staining. The periwound skin appearance did not exhibit: Callus, Crepitus, Excoriation, Induration, Rash, Dry/Scaly, Maceration, Atrophie Blanche, Cyanosis, Ecchymosis, Mottled, Pallor, Rubor, Erythema. Periwound temperature was noted as No Abnormality. Wound #7 status is Open. Original cause of wound was Gradually Appeared. The date acquired was: 03/05/2022. The wound has been in treatment 57 weeks. The wound is located on the Left,Medial Lower Leg. The wound measures 7cm length x 18cm width x 0.1cm depth; 98.96cm^2 area and 9.896cm^3 volume. There is Fat Layer (Subcutaneous Tissue) exposed. There is no tunneling or undermining noted. There is a large amount of serous drainage noted. The wound margin is distinct with the outline attached to the wound base. There is small (1-33%) red granulation within the wound bed. There is a large (67-100%) amount of necrotic tissue within the wound bed including Adherent Slough. The periwound skin appearance exhibited: Scarring, Hemosiderin Staining. The periwound skin appearance did not exhibit: Callus, Crepitus, Excoriation, Induration, Rash, Dry/Scaly, Maceration, Atrophie Blanche, Cyanosis, Ecchymosis, Mottled, Pallor, Rubor, Erythema. Periwound temperature was noted as No Abnormality. 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 There was a Four Layer  Compression Therapy Procedure by Fonnie Mu, RN. Post procedure Diagnosis Wound #12: Same as Pre-Procedure Wound #15 Pre-procedure diagnosis of Wound #15 is a Lymphedema located on the Left,Distal,Posterior Lower Leg . There was a Four Layer Compression Therapy Procedure by Fonnie Mu, RN. Post procedure Diagnosis Wound #15: Same as Pre-Procedure Wound #16 Pre-procedure diagnosis of Wound #16 is a Venous Leg Ulcer located on the Left,Lateral Lower Leg . There was a Four Layer Compression Therapy Procedure by Fonnie Mu, RN. Post procedure Diagnosis Wound #16: Same as Pre-Procedure Wound #6 Pre-procedure diagnosis of Wound #6 is a Lymphedema located on the Right,Medial Lower Leg . There was a Four Layer Compression Therapy Procedure by Fonnie Mu, RN. Post procedure Diagnosis Wound #6: Same as Pre-Procedure Wound #7 Pre-procedure diagnosis of Wound #7 is a Lymphedema located on the Left,Medial Lower Leg . There was a Four Layer Compression Therapy Procedure by Fonnie Mu, RN. Post procedure Diagnosis Wound #7: Same as Pre-Procedure Plan Follow-up Appointments: Return Appointment in 1 week. - Dr. Leanord Hawking Other: - take your fluid pills as prescribed. Speak with cardiologist about increasing fluid pills Anesthetic: (In clinic) Topical Lidocaine 4% applied to wound bed Bathing/ Shower/ Hygiene: May shower with protection but do not get wound dressing(s) wet. Protect dressing(s) with water repellant cover (for example, large plastic bag) or a cast cover and may then take shower. Edema Control - Orders / Instructions: Segmental Compressive Device. Use the Segmental Compressive Device on leg(s) 2-3 times a day for 45 - 60 minutes. If wearing any wraps or hose, do not remove them. Continue exercising as instructed. - x3 a day Elevate legs to the level  of the heart or above for 30 minutes daily and/or when sitting for 3-4 times a day throughout the day. Avoid  standing for long periods of time. Exercise regularly Off-Loading: Other: - When sitting please keep your feet up, keep legs at Heart Level or above. Use pillows behind calf to for comfort. Additional Orders / Instructions: Clingan, [name] [name] [id number] Page 13 of 14 Follow Nutritious Diet - Try and increase protein intake. Goal of protein intake is 60g-100g. Lymphedema Treatment Plan - Exercise, Compression and Elevation: Exercise daily as tolerated. (Walking, ROM, Calf Pumps and T T oe aps) Elevate legs 30 - 60 minutes at or above heart level at least 3 - 4 times daily as able/tolerated Avoid standing for long periods and elevate leg(s) parallel to the floor when sitting Use Pneumatic Compression Device on leg(s) 2-3 times a day for 45-60 minutes - x3 a day WOUND #12: - Lower Leg Wound Laterality: Left, Anterior Cleanser: Soap and Water 1 x Per Week/30 Days Discharge Instructions: May shower and wash wound with dial antibacterial soap and water prior to dressing change. Peri-Wound Care: Sween Lotion (Moisturizing lotion) 1 x Per Week/30 Days Discharge Instructions: Apply moisturizing lotion as directed Prim Dressing: Maxorb Extra CMC/Alginate Dressing, 4x4 (in/in) 1 x Per Week/30 Days ary Discharge Instructions: Apply to wound bed as instructed Secondary Dressing: ABD Pad, 8x10 1 x Per Week/30 Days Discharge Instructions: Apply over primary dressing as directed. Secondary Dressing: Zetuvit Plus 4x8 in 1 x Per Week/30 Days Discharge Instructions: Apply over primary dressing as directed. Secondary Dressing: OptiLock Super Absorbent, 5x5.5 (in/in) (Generic) 1 x Per Week/30 Days Discharge Instructions: Apply directly to wound bed as directed Com pression Wrap: Urgo K2, (equivalent to a 4 layer) two layer compression system, regular 1 x Per Week/30 Days Discharge Instructions: Apply Urgo K2 as directed (alternative to 4 layer compression). WOUND #15: -  Lower Leg Wound Laterality: Left, Posterior, Distal Cleanser: Soap and Water 1 x Per Week/30 Days Discharge Instructions: May shower and wash wound with dial antibacterial soap and water prior to dressing change. Peri-Wound Care: Sween Lotion (Moisturizing lotion) 1 x Per Week/30 Days Discharge Instructions: Apply moisturizing lotion as directed Prim Dressing: Maxorb Extra CMC/Alginate Dressing, 4x4 (in/in) 1 x Per Week/30 Days ary Discharge Instructions: Apply to wound bed as instructed Secondary Dressing: ABD Pad, 8x10 1 x Per Week/30 Days Discharge Instructions: Apply over primary dressing as directed. Secondary Dressing: Zetuvit Plus 4x8 in 1 x Per Week/30 Days Discharge Instructions: Apply over primary dressing as directed. Secondary Dressing: OptiLock Super Absorbent, 5x5.5 (in/in) (Generic) 1 x Per Week/30 Days Discharge Instructions: Apply directly to wound bed as directed Com pression Wrap: Urgo K2, (equivalent to a 4 layer) two layer compression system, regular 1 x Per Week/30 Days Discharge Instructions: Apply Urgo K2 as directed (alternative to 4 layer compression). WOUND #16: - Lower Leg Wound Laterality: Left, Lateral Cleanser: Soap and Water 1 x Per Week/30 Days Discharge Instructions: May shower and wash wound with dial antibacterial soap and water prior to dressing change. Peri-Wound Care: Sween Lotion (Moisturizing lotion) 1 x Per Week/30 Days Discharge Instructions: Apply moisturizing lotion as directed Prim Dressing: Maxorb Extra CMC/Alginate Dressing, 4x4 (in/in) 1 x Per Week/30 Days ary Discharge Instructions: Apply to wound bed as instructed Secondary Dressing: ABD Pad, 8x10 1 x Per Week/30 Days Discharge Instructions: Apply over primary dressing as directed. Secondary Dressing: Zetuvit Plus 4x8 in 1 x Per Week/30 Days Discharge Instructions: Apply over primary dressing as directed. Secondary  Dressing: OptiLock Super Absorbent, 5x5.5 (in/in) (Generic) 1 x Per  Week/30 Days Discharge Instructions: Apply directly to wound bed as directed Com pression Wrap: Urgo K2, (equivalent to a 4 layer) two layer compression system, regular 1 x Per Week/30 Days Discharge Instructions: Apply Urgo K2 as directed (alternative to 4 layer compression). WOUND #6: - Lower Leg Wound Laterality: Right, Medial Cleanser: Soap and Water 1 x Per Week/30 Days Discharge Instructions: May shower and wash wound with dial antibacterial soap and water prior to dressing change. Peri-Wound Care: Sween Lotion (Moisturizing lotion) 1 x Per Week/30 Days Discharge Instructions: Apply moisturizing lotion as directed Prim Dressing: Maxorb Extra CMC/Alginate Dressing, 4x4 (in/in) 1 x Per Week/30 Days ary Discharge Instructions: Apply to wound bed as instructed Secondary Dressing: ABD Pad, 8x10 1 x Per Week/30 Days Discharge Instructions: Apply over primary dressing as directed. Secondary Dressing: Zetuvit Plus 4x8 in 1 x Per Week/30 Days Discharge Instructions: Apply over primary dressing as directed. Secondary Dressing: OptiLock Super Absorbent, 5x5.5 (in/in) (Generic) 1 x Per Week/30 Days Discharge Instructions: Apply directly to wound bed as directed Com pression Wrap: Urgo K2, (equivalent to a 4 layer) two layer compression system, regular 1 x Per Week/30 Days Discharge Instructions: Apply Urgo K2 as directed (alternative to 4 layer compression). WOUND #7: - Lower Leg Wound Laterality: Left, Medial Cleanser: Soap and Water 1 x Per Week/30 Days Discharge Instructions: May shower and wash wound with dial antibacterial soap and water prior to dressing change. Peri-Wound Care: Sween Lotion (Moisturizing lotion) 1 x Per Week/30 Days Discharge Instructions: Apply moisturizing lotion as directed Prim Dressing: Maxorb Extra CMC/Alginate Dressing, 4x4 (in/in) 1 x Per Week/30 Days ary Discharge Instructions: Apply to wound bed as instructed Secondary Dressing: ABD Pad, 8x10 1 x Per Week/30  Days Discharge Instructions: Apply over primary dressing as directed. Secondary Dressing: Zetuvit Plus 4x8 in 1 x Per Week/30 Days Discharge Instructions: Apply over primary dressing as directed. Secondary Dressing: OptiLock Super Absorbent, 5x5.5 (in/in) (Generic) 1 x Per Week/30 Days Discharge Instructions: Apply directly to wound bed as directed Com pression Wrap: Urgo K2, (equivalent to a 4 layer) two layer compression system, regular 1 x Per Week/30 Days Discharge Instructions: Apply Urgo K2 as directed (alternative to 4 layer compression). Wound exam; punched-out areas on the right medial, left lateral left anterior and left posterior lateral. Necrotic surface. There is simply too much edema fluid to consider debridement. Although he is using his compression pumps twice a day for an hour I have that we do not seem to be in control of the edema at all. He took his wrap off 3 days ago. I do not know that I have any other option but to put 4-layer compression on his lower legs. I have asked him to increase his compression pump usage to 3 times a day. No alternative dressings comes to mind He does not seem to be able to get a hold of his cardiologist I really do not want to get into managing edema aggressively with loop diuretics Auth, Juvencio ([id number]) [id number] Page 14 of 14 Electronic Signature(s) Signed: 04/29/2023 5:18:28 PM By: Baltazar Najjar MD Entered By: Baltazar Najjar on 04/29/2023 13:49:34 -------------------------------------------------------------------------------- SuperBill Details Patient Name: Date of Service: Mulhall, [name] 04/29/2023 Medical Record Number: [id number] Patient Account Number: [id number] Date of Birth/Sex: Treating RN: 07/20/1945 (77 y.o. Charlean Merl, [name] Primary Care Provider: [name] Other Clinician: Referring Provider: Treating Provider/Extender: Valentina Gu Weeks in Treatment:  32 Diagnosis Coding  ICD-10 Codes Code Description I87.333 Chronic venous hypertension (idiopathic) with ulcer and inflammation of bilateral lower extremity I89.0 Lymphedema, not elsewhere classified 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.818 Non-pressure chronic ulcer of other part of right lower leg with other specified severity Facility Procedures : CPT4: Code [id number] Description: 81 BILATERAL: Application of multi-layer venous compression system; leg (below knee), including ankle and t. Modifier: Quantity: 1 Physician Procedures : CPT4 Code Description Modifier [id number] - WC PHYS LEVEL 3 - EST PT ICD-10 Diagnosis Description I87.333 Chronic venous hypertension (idiopathic) with ulcer and inflammation of bilateral lower extremity I89.0 Lymphedema, not elsewhere classified  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.818 Non-pressure chronic ulcer of other part of right lower leg with other specified severity Quantity: 1 Electronic Signature(s) Signed: 04/29/2023 5:18:28 PM By: Baltazar Najjar MD Previous Signature: 04/29/2023 4:40:06 PM Version By: Fonnie Mu RN Entered By: Baltazar Najjar on 04/29/2023 13:50:02

## 2023-05-06 ENCOUNTER — Encounter (HOSPITAL_BASED_OUTPATIENT_CLINIC_OR_DEPARTMENT_OTHER): Payer: Medicare Other | Admitting: Internal Medicine

## 2023-05-06 DIAGNOSIS — I87333 Chronic venous hypertension (idiopathic) with ulcer and inflammation of bilateral lower extremity: Secondary | ICD-10-CM | POA: Diagnosis not present

## 2023-05-07 NOTE — Progress Notes (Signed)
[name] ([id number]) [id number] Page 1 of 15 Visit Report for 04/22/2023 Arrival Information Details Patient Name: Date of Service: [name], [name] 04/22/2023 3:00 PM Medical Record Number: [id number] Patient Account Number: [id number] Date of Birth/Sex: Treating RN: [birth year] (77 y.o. Marlan Palau Primary Care Jarrick Fjeld: [name] Other Clinician: Referring Mycah Formica: Treating Marijane Trower/Extender: Valentina Gu Weeks in Treatment: 63 Visit Information History Since Last Visit Added or deleted any medications: No Patient Arrived: [place] Mor Any new allergies or adverse reactions: No Arrival Time: 15:00 Had a fall or experienced change in No Accompanied By: self activities of daily living that may affect Transfer Assistance: None risk of falls: Patient Identification Verified: Yes Signs or symptoms of abuse/neglect since last visito No Secondary Verification Process Completed: Yes Hospitalized since last visit: No Patient Requires Transmission-Based Precautions: No Implantable device outside of the clinic excluding No Patient Has Alerts: Yes cellular tissue based products placed in the center Patient Alerts: Patient on Blood Thinner since last visit: Right ABI in clinic [place] Has Dressing in Place as Prescribed: Yes Has Compression in Place as Prescribed: Yes Pain Present Now: Yes Electronic Signature(s) Signed: 04/22/2023 4:04:24 PM By: Samuella Bruin Entered By: Samuella Bruin on 04/22/2023 12:00:24 -------------------------------------------------------------------------------- Compression Therapy Details Patient Name: Date of Service: Schollmeyer, [name] 04/22/2023 3:00 PM Medical Record Number: [id number] Patient Account Number: [id number] Date of Birth/Sex: Treating RN: Sep 29, 1945 (77 y.o. Marlan Palau Primary Care Mccall Lomax: [name] Other Clinician: Referring Zalmen Wrightsman: Treating  Evanie Buckle/Extender: Valentina Gu Weeks in Treatment: 39 Compression Therapy Performed for Wound Assessment: Wound #12 Left,Anterior Lower Leg Performed By: Clinician Samuella Bruin, RN Compression Type: Double Layer Post Procedure Diagnosis Same as Pre-procedure Electronic Signature(s) Signed: 04/22/2023 4:04:24 PM By: Samuella Bruin Entered By: Samuella Bruin on 04/22/2023 12:49:10 [place], Dannielle [name] ([id number]) [id number] Page 2 of 15 -------------------------------------------------------------------------------- Compression Therapy Details Patient Name: Date of Service: Hannon, [name] 04/22/2023 3:00 PM Medical Record Number: [id number] Patient Account Number: [id number] Date of Birth/Sex: Treating RN: 10-Jun-1945 (77 y.o. Marlan Palau Primary Care Mersadies Petree: [name] Other Clinician: Referring Makyi Ledo: Treating Thena Devora/Extender: Valentina Gu Weeks in Treatment: 59 Compression Therapy Performed for Wound Assessment: Wound #6 Right,Medial Lower Leg Performed By: Clinician Samuella Bruin, RN Compression Type: Double Layer Post Procedure Diagnosis Same as Pre-procedure Electronic Signature(s) Signed: 04/22/2023 4:04:24 PM By: Gelene Mink By: Samuella Bruin on 04/22/2023 12:49:10 -------------------------------------------------------------------------------- Encounter Discharge Information Details Patient Name: Date of Service: [name], [name] 04/22/2023 3:00 PM Medical Record Number: [id number] Patient Account Number: [id number] Date of Birth/Sex: Treating RN: [birth year] (77 y.o. Marlan Palau Primary Care Maycel Riffe: [name] Other Clinician: Referring Aine Strycharz: Treating Rochelle Nephew/Extender: Valentina Gu Weeks in Treatment: 32 Encounter Discharge Information Items Discharge Condition: Stable Ambulatory Status: Cane Discharge  Destination: Home Transportation: Private Auto Accompanied By: self Schedule Follow-up Appointment: Yes Clinical Summary of Care: Patient Declined Electronic Signature(s) Signed: 04/22/2023 4:04:24 PM By: Samuella Bruin Entered By: Samuella Bruin on 04/22/2023 12:51:19 -------------------------------------------------------------------------------- Lower Extremity Assessment Details Patient Name: Date of Service: [name], [name] 04/22/2023 3:00 PM Medical Record Number: [id number] Patient Account Number: [id number] Date of Birth/Sex: Treating RN: 18-Aug-1945 (77 y.o. Marlan Palau Primary Care Jasaiah Karwowski: [name] Other Clinician: Referring Jezlyn Westerfield: Treating Burgandy Hackworth/Extender: Dominga Ferry, [name] Weeks in Treatment: 90 Edema Assessment Assessed: [Left: No] [Right: No] Edema: [Left: Yes] [Right: Yes] Calf Silberstein, Nesbit ([id number]) [id number] Page 3 of 15 Left: Right: Point of Measurement:  40 cm From Medial Instep 60 cm 62 cm Ankle Left: Right: Point of Measurement: 13 cm From Medial Instep 48 cm 53 cm Vascular Assessment Pulses: Dorsalis Pedis Palpable: [Left:Yes] [Right:Yes] Extremity colors, hair growth, and conditions: Extremity Color: [Left:Hyperpigmented] [Right:Hyperpigmented] Hair Growth on Extremity: [Left:No] Temperature of Extremity: [Left:Warm] [Right:Warm] Capillary Refill: [Left:> 3 seconds] [Right:> 3 seconds] Dependent Rubor: [Left:No Yes] [Right:No Yes] Electronic Signature(s) Signed: 04/22/2023 4:04:24 PM By: Samuella Bruin Entered By: Samuella Bruin on 04/22/2023 12:28:32 -------------------------------------------------------------------------------- Multi Wound Chart Details Patient Name: Date of Service: [name], [name] 04/22/2023 3:00 PM Medical Record Number: [id number] Patient Account Number: [id number] Date of Birth/Sex: Treating RN: Dec 25, 1945 (77 y.o. M) Primary Care Kairie Vangieson:  [name] Other Clinician: Referring Taaj Hurlbut: Treating Nephi Savage/Extender: Valentina Gu Weeks in Treatment: 62 Vital Signs Height(in): 74 Pulse(bpm): 99 Weight(lbs): 250 Blood Pressure(mmHg): 169/73 Body Mass Index(BMI): 32.1 Temperature(F): 97.8 Respiratory Rate(breaths/min): 18 [12:Photos:] Left, Anterior Lower Leg Left, Distal, Posterior Lower Leg Left, Lateral Lower Leg Wound Location: Frostbite Gradually Appeared Blister Wounding Event: Lymphedema Lymphedema Venous Leg Ulcer Primary Etiology: Anemia, Lymphedema, Deep Vein Anemia, Lymphedema, Deep Vein Anemia, Lymphedema, Deep Vein Comorbid History: Thrombosis, Hypertension, Received Thrombosis, Hypertension, Received Thrombosis, Hypertension, Received Radiation Radiation Radiation 12/17/2022 01/14/2023 04/01/2023 Date Acquired: 18 9 3  Weeks of Treatment: Open Open Open Wound Status: No No No Wound Recurrence: No No Yes Clustered Wound: N/A N/A 3 Clustered Quantity: 2.5x4.7x0.1 3.2x4.5x0.1 5x5.5x0.1 Measurements L x W x D (cm) 9.228 11.31 21.598 A (cm) : rea 0.923 1.131 2.16 Volume (cm) : -161.10% -28.00% -291.80% % Reduction in Area: Plante, Bobbie ([id number]) [id number] Page 4 of 15 -30.60% -27.90% -292.00% % Reduction in Volume: Full Thickness Without Exposed Full Thickness Without Exposed Full Thickness Without Exposed Classification: Support Structures Support Structures Support Structures Large Medium Medium Exudate Amount: Serous Serosanguineous Serous Exudate Type: amber red, brown amber Exudate Color: Distinct, outline attached Distinct, outline attached Distinct, outline attached Wound Margin: Small (1-33%) Small (1-33%) Small (1-33%) Granulation Amount: Red, Pink Pink Red Granulation Quality: Large (67-100%) Large (67-100%) Large (67-100%) Necrotic Amount: Fat Layer (Subcutaneous Tissue): Yes Fat Layer (Subcutaneous Tissue): Yes Fat  Layer (Subcutaneous Tissue): Yes Exposed Structures: Fascia: No Fascia: No Fascia: No Tendon: No Tendon: No Tendon: No Muscle: No Muscle: No Muscle: No Joint: No Joint: No Joint: No Bone: No Bone: No Bone: No Small (1-33%) Small (1-33%) Small (1-33%) Epithelialization: Excoriation: No Excoriation: No Excoriation: No Periwound Skin Texture: Induration: No Induration: No Induration: No Callus: No Callus: No Callus: No Crepitus: No Crepitus: No Crepitus: No Rash: No Rash: No Rash: No Scarring: No Scarring: No Scarring: No Maceration: No Maceration: No Maceration: No Periwound Skin Moisture: Dry/Scaly: No Dry/Scaly: No Dry/Scaly: No Hemosiderin Staining: Yes Hemosiderin Staining: Yes Hemosiderin Staining: Yes Periwound Skin Color: Atrophie Blanche: No Atrophie Blanche: No Atrophie Blanche: No Cyanosis: No Cyanosis: No Cyanosis: No Ecchymosis: No Ecchymosis: No Ecchymosis: No Erythema: No Erythema: No Erythema: No Mottled: No Mottled: No Mottled: No Pallor: No Pallor: No Pallor: No Rubor: No Rubor: No Rubor: No No Abnormality N/A No Abnormality Temperature: Compression Therapy N/A N/A Procedures Performed: Wound Number: 6 7 N/A Photos: N/A Right, Medial Lower Leg Left, Medial Lower Leg N/A Wound Location: Gradually Appeared Gradually Appeared N/A Wounding Event: Lymphedema Lymphedema N/A Primary Etiology: Anemia, Lymphedema, Deep Vein Anemia, Lymphedema, Deep Vein N/A Comorbid History: Thrombosis, Hypertension, Received Thrombosis, Hypertension, Received Radiation Radiation 03/05/2022 03/05/2022 N/A Date Acquired: 76 56 N/A Weeks of Treatment: Open Open N/A Wound Status: No  No N/A Wound Recurrence: Yes Yes N/A Clustered Wound: 3 1 N/A Clustered Quantity: 12.5x8x0.3 4.5x4.5x0.1 N/A Measurements L x W x D (cm) 78.54 15.904 N/A A (cm) : rea 23.562 1.59 N/A Volume (cm) : 45.10% 66.30% N/A % Reduction in Area: -64.80%  66.30% N/A % Reduction in Volume: Full Thickness Without Exposed Full Thickness Without Exposed N/A Classification: Support Structures Support Structures Large Large N/A Exudate Amount: Serous Serous N/A Exudate Type: Media planner N/A Exudate Color: Distinct, outline attached Distinct, outline attached N/A Wound Margin: Small (1-33%) Small (1-33%) N/A Granulation Amount: Pink Red N/A Granulation Quality: Large (67-100%) Large (67-100%) N/A Necrotic Amount: Fat Layer (Subcutaneous Tissue): Yes Fat Layer (Subcutaneous Tissue): Yes N/A Exposed Structures: Fascia: No Fascia: No Tendon: No Tendon: No Muscle: No Muscle: No Joint: No Joint: No Bone: No Bone: No Small (1-33%) Small (1-33%) N/A Epithelialization: Scarring: Yes Scarring: Yes N/A Periwound Skin Texture: Excoriation: No Excoriation: No Induration: No Induration: No Callus: No Callus: No Crepitus: No Crepitus: No Rash: No Rash: No Maceration: No Maceration: No N/A Periwound Skin Moisture: Kindall, Jahkari ([id number]) [id number] Page 5 of 15 Dry/Scaly: No Dry/Scaly: No Hemosiderin Staining: Yes Hemosiderin Staining: Yes N/A Periwound Skin Color: Atrophie Blanche: No Atrophie Blanche: No Cyanosis: No Cyanosis: No Ecchymosis: No Ecchymosis: No Erythema: No Erythema: No Mottled: No Mottled: No Pallor: No Pallor: No Rubor: No Rubor: No No Abnormality No Abnormality N/A Temperature: Compression Therapy N/A N/A Procedures Performed: Treatment Notes Wound #12 (Lower Leg) Wound Laterality: Left, Anterior Cleanser Soap and Water Discharge Instruction: May shower and wash wound with dial antibacterial soap and water prior to dressing change. Peri-Wound Care Sween Lotion (Moisturizing lotion) Discharge Instruction: Apply moisturizing lotion as directed Topical Primary Dressing Maxorb Extra CMC/Alginate Dressing, 4x4 (in/in) Discharge Instruction: Apply to wound bed  as instructed Secondary Dressing ABD Pad, 8x10 Discharge Instruction: Apply over primary dressing as directed. Zetuvit Plus 4x8 in Discharge Instruction: Apply over primary dressing as directed. OptiLock Super Absorbent, 5x5.5 (in/in) Discharge Instruction: Apply directly to wound bed as directed Secured With Compression Wrap Urgo K2, (equivalent to a 4 layer) two layer compression system, regular Discharge Instruction: Apply Urgo K2 as directed (alternative to 4 layer compression). Compression Stockings Add-Ons Wound #15 (Lower Leg) Wound Laterality: Left, Posterior, Distal Cleanser Soap and Water Discharge Instruction: May shower and wash wound with dial antibacterial soap and water prior to dressing change. Peri-Wound Care Sween Lotion (Moisturizing lotion) Discharge Instruction: Apply moisturizing lotion as directed Topical Primary Dressing Maxorb Extra CMC/Alginate Dressing, 4x4 (in/in) Discharge Instruction: Apply to wound bed as instructed Secondary Dressing ABD Pad, 8x10 Discharge Instruction: Apply over primary dressing as directed. Zetuvit Plus 4x8 in Discharge Instruction: Apply over primary dressing as directed. OptiLock Super Absorbent, 5x5.5 (in/in) Discharge Instruction: Apply directly to wound bed as directed Secured With Compression Wrap Urgo K2, (equivalent to a 4 layer) two layer compression system, regular Bonnet, Mance ([id number]) [id number] Page 6 of 15 Discharge Instruction: Apply Urgo K2 as directed (alternative to 4 layer compression). Compression Stockings Add-Ons Wound #16 (Lower Leg) Wound Laterality: Left, Lateral Cleanser Soap and Water Discharge Instruction: May shower and wash wound with dial antibacterial soap and water prior to dressing change. Peri-Wound Care Sween Lotion (Moisturizing lotion) Discharge Instruction: Apply moisturizing lotion as directed Topical Primary Dressing Maxorb Extra CMC/Alginate  Dressing, 4x4 (in/in) Discharge Instruction: Apply to wound bed as instructed Secondary Dressing ABD Pad, 8x10 Discharge Instruction: Apply over primary dressing as directed. Zetuvit Plus 4x8 in Discharge Instruction: Apply over primary  dressing as directed. OptiLock Super Absorbent, 5x5.5 (in/in) Discharge Instruction: Apply directly to wound bed as directed Secured With Compression Wrap Urgo K2, (equivalent to a 4 layer) two layer compression system, regular Discharge Instruction: Apply Urgo K2 as directed (alternative to 4 layer compression). Compression Stockings Add-Ons Wound #6 (Lower Leg) Wound Laterality: Right, Medial Cleanser Soap and Water Discharge Instruction: May shower and wash wound with dial antibacterial soap and water prior to dressing change. Peri-Wound Care Sween Lotion (Moisturizing lotion) Discharge Instruction: Apply moisturizing lotion as directed Topical Primary Dressing Maxorb Extra CMC/Alginate Dressing, 4x4 (in/in) Discharge Instruction: Apply to wound bed as instructed Secondary Dressing ABD Pad, 8x10 Discharge Instruction: Apply over primary dressing as directed. Zetuvit Plus 4x8 in Discharge Instruction: Apply over primary dressing as directed. OptiLock Super Absorbent, 5x5.5 (in/in) Discharge Instruction: Apply directly to wound bed as directed Secured With Compression Wrap Urgo K2, (equivalent to a 4 layer) two layer compression system, regular Discharge Instruction: Apply Urgo K2 as directed (alternative to 4 layer compression). Compression Stockings Add-Ons Schmoll, Dannielle [name] ([id number]) [id number] Page 7 of 15 Wound #7 (Lower Leg) Wound Laterality: Left, Medial Cleanser Soap and Water Discharge Instruction: May shower and wash wound with dial antibacterial soap and water prior to dressing change. Peri-Wound Care Sween Lotion (Moisturizing lotion) Discharge Instruction: Apply moisturizing lotion as  directed Topical Primary Dressing Maxorb Extra CMC/Alginate Dressing, 4x4 (in/in) Discharge Instruction: Apply to wound bed as instructed Secondary Dressing ABD Pad, 8x10 Discharge Instruction: Apply over primary dressing as directed. Zetuvit Plus 4x8 in Discharge Instruction: Apply over primary dressing as directed. OptiLock Super Absorbent, 5x5.5 (in/in) Discharge Instruction: Apply directly to wound bed as directed Secured With Compression Wrap Urgo K2, (equivalent to a 4 layer) two layer compression system, regular Discharge Instruction: Apply Urgo K2 as directed (alternative to 4 layer compression). Compression Stockings Add-Ons Electronic Signature(s) Signed: 04/23/2023 4:45:28 AM By: Baltazar Najjar MD Entered By: Baltazar Najjar on 04/22/2023 13:18:38 -------------------------------------------------------------------------------- Multi-Disciplinary Care Plan Details Patient Name: Date of Service: Duzan, [name] 04/22/2023 3:00 PM Medical Record Number: [id number] Patient Account Number: [id number] Date of Birth/Sex: Treating RN: [birth year] (77 y.o. Marlan Palau Primary Care Aundra Espin: [name] Other Clinician: Referring Adel Neyer: Treating Detria Cummings/Extender: Valentina Gu Weeks in Treatment: 57 Multidisciplinary Care Plan reviewed with physician Active Inactive Wound/Skin Impairment Nursing Diagnoses: Impaired tissue integrity Knowledge deficit related to ulceration/compromised skin integrity Goals: Patient will have a decrease in wound volume by X% from date: (specify in notes) Date Initiated: 03/26/2022 Target Resolution Date: 05/19/2025 Goal Status: Active Patient/caregiver will verbalize understanding of skin care regimen Date Initiated: 03/26/2022 Date Inactivated: 03/25/2023 Target Resolution Date: 04/17/2025 Unmet Reason: patient missed last Goal Status: Unmet Pichardo, Dannielle [name] ([id number]) [id number]  Page 8 of 15 Goal Status: Unmet week. Patient Stop taking his diuertic. Ulcer/skin breakdown will have a volume reduction of 30% by week 4 Date Initiated: 03/26/2022 Date Inactivated: 05/21/2022 Target Resolution Date: 05/17/2022 Unmet Reason: see wound Goal Status: Unmet measurement. Ulcer/skin breakdown will have a volume reduction of 50% by week 8 Date Initiated: 03/26/2022 Date Inactivated: 05/21/2022 Target Resolution Date: 05/17/2022 Unmet Reason: see wound Goal Status: Unmet measurement. Interventions: Assess patient/caregiver ability to obtain necessary supplies Assess patient/caregiver ability to perform ulcer/skin care regimen upon admission and as needed Assess ulceration(s) every visit Notes: Patient stated today, "I will take my fluid pill or pump not do both." Emmalea Treanor made aware. Electronic Signature(s) Signed: 04/22/2023 4:04:24 PM By: Samuella Bruin Entered By: Samuella Bruin on 04/22/2023 12:01:01 --------------------------------------------------------------------------------  Pain Assessment Details Patient Name: Date of Service: Smarr, [name] 04/22/2023 3:00 PM Medical Record Number: [id number] Patient Account Number: [id number] Date of Birth/Sex: Treating RN: 1946-02-25 (77 y.o. Marlan Palau Primary Care Jamin Humphries: [name] Other Clinician: Referring Dacen Frayre: Treating Bayla Mcgovern/Extender: Valentina Gu Weeks in Treatment: 87 Active Problems Location of Pain Severity and Description of Pain Patient Has Paino Yes Site Locations Pain Location: Generalized Pain, Pain in Ulcers Duration of the Pain. Constant / Intermittento Constant Rate the pain. Current Pain Level: 8 Character of Pain Describe the Pain: Aching Pain Management and Medication Current Pain Management: Medication: Yes Electronic Signature(s) Signed: 04/22/2023 4:04:24 PM By: Samuella Bruin Entered By: Samuella Bruin on 04/22/2023  12:00:40 Corcino, Dannielle [name] ([id number]) [id number] Page 9 of 15 -------------------------------------------------------------------------------- Patient/Caregiver Education Details Patient Name: Date of Service: Yankovich, [name] 12/4/2024andnbsp3:00 PM Medical Record Number: [id number] Patient Account Number: [id number] Date of Birth/Gender: Treating RN: [birth year] (77 y.o. Marlan Palau Primary Care Physician: [name] Other Clinician: Referring Physician: Treating Physician/Extender: Stark Klein in Treatment: 73 Education Assessment Education Provided To: Patient Education Topics Provided Wound/Skin Impairment: Methods: Explain/Verbal Responses: Reinforcements needed, State content correctly Electronic Signature(s) Signed: 04/22/2023 4:04:24 PM By: Samuella Bruin Entered By: Samuella Bruin on 04/22/2023 12:01:15 -------------------------------------------------------------------------------- Wound Assessment Details Patient Name: Date of Service: Jager, [name] 04/22/2023 3:00 PM Medical Record Number: [id number] Patient Account Number: [id number] Date of Birth/Sex: Treating RN: 13-Apr-1946 (77 y.o. Marlan Palau Primary Care Keilana Morlock: [name] Other Clinician: Referring Imani Sherrin: Treating Carmine Youngberg/Extender: Valentina Gu Weeks in Treatment: 51 Wound Status Wound Number: 12 Primary Lymphedema Etiology: Wound Location: Left, Anterior Lower Leg Wound Open Wounding Event: Frostbite Status: Date Acquired: 12/17/2022 Comorbid Anemia, Lymphedema, Deep Vein Thrombosis, Hypertension, Weeks Of Treatment: 18 History: Received Radiation Clustered Wound: No Photos Wound Measurements Length: (cm) 2.5 Width: (cm) 4.7 Mellinger, Tyreak ([id number]) Depth: (cm) 0.1 Area: (cm) 9.228 Volume: (cm) 0.923 % Reduction in Area: -161.1% % Reduction in Volume:  -30.6% [id number] Page 10 of 15 Epithelialization: Small (1-33%) Tunneling: No Undermining: No Wound Description Classification: Full Thickness Without Exposed Suppor Wound Margin: Distinct, outline attached Exudate Amount: Large Exudate Type: Serous Exudate Color: amber t Structures Foul Odor After Cleansing: No Slough/Fibrino Yes Wound Bed Granulation Amount: Small (1-33%) Exposed Structure Granulation Quality: Red, Pink Fascia Exposed: No Necrotic Amount: Large (67-100%) Fat Layer (Subcutaneous Tissue) Exposed: Yes Necrotic Quality: Adherent Slough Tendon Exposed: No Muscle Exposed: No Joint Exposed: No Bone Exposed: No Periwound Skin Texture Texture Color No Abnormalities Noted: No No Abnormalities Noted: No Callus: No Atrophie Blanche: No Crepitus: No Cyanosis: No Excoriation: No Ecchymosis: No Induration: No Erythema: No Rash: No Hemosiderin Staining: Yes Scarring: No Mottled: No Pallor: No Moisture Rubor: No No Abnormalities Noted: No Dry / Scaly: No Temperature / Pain Maceration: No Temperature: No Abnormality Electronic Signature(s) Signed: 04/22/2023 4:04:24 PM By: Samuella Bruin Signed: 05/07/2023 9:56:00 AM By: Karl Ito Entered By: Karl Ito on 04/22/2023 12:37:21 -------------------------------------------------------------------------------- Wound Assessment Details Patient Name: Date of Service: Ferraz, [name] 04/22/2023 3:00 PM Medical Record Number: [id number] Patient Account Number: [id number] Date of Birth/Sex: Treating RN: 10-08-1945 (77 y.o. Marlan Palau Primary Care Abhay Godbolt: [name] Other Clinician: Referring Nilson Tabora: Treating Raesean Bartoletti/Extender: Valentina Gu Weeks in Treatment: 43 Wound Status Wound Number: 15 Primary Lymphedema Etiology: Wound Location: Left, Distal, Posterior Lower Leg Wound Open Wounding Event: Gradually  Appeared Status: Date Acquired: 01/14/2023 Comorbid Anemia, Lymphedema,  Deep Vein Thrombosis, Hypertension, Weeks Of Treatment: 9 History: Received Radiation Clustered Wound: No Photos Duval, Dannielle [name] ([id number]) [id number] Page 11 of 15 Wound Measurements Length: (cm) 3.2 Width: (cm) 4.5 Depth: (cm) 0.1 Area: (cm) 11.31 Volume: (cm) 1.131 % Reduction in Area: -28% % Reduction in Volume: -27.9% Epithelialization: Small (1-33%) Tunneling: No Undermining: No Wound Description Classification: Full Thickness Without Exposed Suppor Wound Margin: Distinct, outline attached Exudate Amount: Medium Exudate Type: Serosanguineous Exudate Color: red, brown t Structures Foul Odor After Cleansing: No Slough/Fibrino Yes Wound Bed Granulation Amount: Small (1-33%) Exposed Structure Granulation Quality: Pink Fascia Exposed: No Necrotic Amount: Large (67-100%) Fat Layer (Subcutaneous Tissue) Exposed: Yes Necrotic Quality: Adherent Slough Tendon Exposed: No Muscle Exposed: No Joint Exposed: No Bone Exposed: No Periwound Skin Texture Texture Color No Abnormalities Noted: No No Abnormalities Noted: No Callus: No Atrophie Blanche: No Crepitus: No Cyanosis: No Excoriation: No Ecchymosis: No Induration: No Erythema: No Rash: No Hemosiderin Staining: Yes Scarring: No Mottled: No Pallor: No Moisture Rubor: No No Abnormalities Noted: No Dry / Scaly: No Maceration: No Electronic Signature(s) Signed: 04/22/2023 4:04:24 PM By: Samuella Bruin Signed: 05/07/2023 9:56:00 AM By: Karl Ito Entered By: Karl Ito on 04/22/2023 12:37:57 -------------------------------------------------------------------------------- Wound Assessment Details Patient Name: Date of Service: Eissler, [name] 04/22/2023 3:00 PM Medical Record Number: [id number] Patient Account Number: [id number] Date of Birth/Sex: Treating RN: [birth year] (77 y.o. Marlan Palau Primary Care Aison Malveaux: [name] Other Clinician: Referring [name] Melhorn: Treating Shreeya Recendiz/Extender: Valentina Gu Weeks in Treatment: 23 Wound Status Morreale, Dannielle [name] ([id number]) [id number] Page 12 of 15 Wound Number: 16 Primary Venous Leg Ulcer Etiology: Wound Location: Left, Lateral Lower Leg Wound Open Wounding Event: Blister Status: Date Acquired: 04/01/2023 Comorbid Anemia, Lymphedema, Deep Vein Thrombosis, Hypertension, Weeks Of Treatment: 3 History: Received Radiation Clustered Wound: Yes Photos Wound Measurements Length: (cm) Width: (cm) Depth: (cm) Clustered Quantity: Area: (cm) Volume: (cm) 5 % Reduction in Area: -291.8% 5.5 % Reduction in Volume: -292% 0.1 Epithelialization: Small (1-33%) 3 Tunneling: No 21.598 Undermining: No 2.16 Wound Description Classification: Full Thickness Without Exposed Sup Wound Margin: Distinct, outline attached Exudate Amount: Medium Exudate Type: Serous Exudate Color: amber port Structures Foul Odor After Cleansing: No Slough/Fibrino Yes Wound Bed Granulation Amount: Small (1-33%) Exposed Structure Granulation Quality: Red Fascia Exposed: No Necrotic Amount: Large (67-100%) Fat Layer (Subcutaneous Tissue) Exposed: Yes Necrotic Quality: Adherent Slough Tendon Exposed: No Muscle Exposed: No Joint Exposed: No Bone Exposed: No Periwound Skin Texture Texture Color No Abnormalities Noted: Yes No Abnormalities Noted: No Atrophie Blanche: No Moisture Cyanosis: No No Abnormalities Noted: Yes Ecchymosis: No Erythema: No Hemosiderin Staining: Yes Mottled: No Pallor: No Rubor: No Temperature / Pain Temperature: No Abnormality Electronic Signature(s) Signed: 04/22/2023 4:04:24 PM By: Samuella Bruin Signed: 05/07/2023 9:56:00 AM By: Karl Ito Entered By: Karl Ito on 04/22/2023 12:39:23 Wound Assessment  Details -------------------------------------------------------------------------------- Stickley, Dannielle [name] ([id number]) [id number] Page 13 of 15 Patient Name: Date of Service: [place]pe, [name] 04/22/2023 3:00 PM Medical Record Number: [id number] Patient Account Number: [id number] Date of Birth/Sex: Treating RN: 10-05-1945 (77 y.o. Marlan Palau Primary Care Eugen Jeansonne: [name] Other Clinician: Referring Severn Goddard: Treating Tujuana Kilmartin/Extender: Valentina Gu Weeks in Treatment: 31 Wound Status Wound Number: 6 Primary Lymphedema Etiology: Wound Location: Right, Medial Lower Leg Wound Open Wounding Event: Gradually Appeared Status: Date Acquired: 03/05/2022 Comorbid Anemia, Lymphedema, Deep Vein Thrombosis, Hypertension, Weeks Of Treatment: 56 History: Received Radiation Clustered Wound: Yes Photos Wound Measurements Length: (cm) 12.5 % Reduction in  Area: 45.1% Width: (cm) 8 % Reduction in Volume: -64.8% Depth: (cm) 0.3 Epithelialization: Small (1-33%) Clustered Quantity: 3 Tunneling: No Area: (cm) 78.54 Undermining: No Volume: (cm) 23.562 Wound Description Classification: Full Thickness Without Exposed Support Structures Foul Odor After Cleansing: No Wound Margin: Distinct, outline attached Slough/Fibrino Yes Exudate Amount: Large Exudate Type: Serous Exudate Color: amber Wound Bed Granulation Amount: Small (1-33%) Exposed Structure Granulation Quality: Pink Fascia Exposed: No Necrotic Amount: Large (67-100%) Fat Layer (Subcutaneous Tissue) Exposed: Yes Necrotic Quality: Adherent Slough Tendon Exposed: No Muscle Exposed: No Joint Exposed: No Bone Exposed: No Periwound Skin Texture Texture Color No Abnormalities Noted: No No Abnormalities Noted: No Callus: No Atrophie Blanche: No Crepitus: No Cyanosis: No Excoriation: No Ecchymosis: No Induration: No Erythema: No Rash: No Hemosiderin Staining:  Yes Scarring: Yes Mottled: No Pallor: No Moisture Rubor: No No Abnormalities Noted: No Dry / Scaly: No Temperature / Pain Maceration: No Temperature: No Abnormality Electronic Signature(s) Signed: 04/22/2023 4:04:24 PM By: Samuella Bruin Signed: 05/07/2023 9:56:00 AM By: Karl Ito Entered By: Karl Ito on 04/22/2023 12:38:24 [place]ine[place], Dannielle [name] ([id number]) [id number] Page 14 of 15 -------------------------------------------------------------------------------- Wound Assessment Details Patient Name: Date of Service: Colbaugh, [name] 04/22/2023 3:00 PM Medical Record Number: [id number] Patient Account Number: [id number] Date of Birth/Sex: Treating RN: [birth year] (77 y.o. Marlan Palau Primary Care Aasiya Creasey: [name] Other Clinician: Referring Providencia Hottenstein: Treating Jessie Schrieber/Extender: Valentina Gu Weeks in Treatment: 22 Wound Status Wound Number: 7 Primary Lymphedema Etiology: Wound Location: Left, Medial Lower Leg Wound Open Wounding Event: Gradually Appeared Status: Date Acquired: 03/05/2022 Comorbid Anemia, Lymphedema, Deep Vein Thrombosis, Hypertension, Weeks Of Treatment: 56 History: Received Radiation Clustered Wound: Yes Photos Wound Measurements Length: (cm) Width: (cm) Depth: (cm) Clustered Quantity: Area: (cm) Volume: (cm) 4.5 % Reduction in Area: 66.3% 4.5 % Reduction in Volume: 66.3% 0.1 Epithelialization: Small (1-33%) 1 Tunneling: No 15.904 Undermining: No 1.59 Wound Description Classification: Full Thickness Without Exposed Sup Wound Margin: Distinct, outline attached Exudate Amount: Large Exudate Type: Serous Exudate Color: amber port Structures Foul Odor After Cleansing: No Slough/Fibrino Yes Wound Bed Granulation Amount: Small (1-33%) Exposed Structure Granulation Quality: Red Fascia Exposed: No Necrotic Amount: Large (67-100%) Fat Layer (Subcutaneous Tissue) Exposed:  Yes Necrotic Quality: Adherent Slough Tendon Exposed: No Muscle Exposed: No Joint Exposed: No Bone Exposed: No Periwound Skin Texture Texture Color No Abnormalities Noted: No No Abnormalities Noted: No Callus: No Atrophie Blanche: No Crepitus: No Cyanosis: No Excoriation: No Ecchymosis: No Induration: No Erythema: No Rash: No Hemosiderin Staining: Yes Scarring: Yes Mottled: No Pallor: No Moisture Rubor: No No Abnormalities Noted: No Wittler, Imri ([id number]) [id number] Page 15 of 15 Dry / Scaly: No Temperature / Pain Maceration: No Temperature: No Abnormality Electronic Signature(s) Signed: 04/22/2023 4:04:24 PM By: Samuella Bruin Signed: 05/07/2023 9:56:00 AM By: Karl Ito Entered By: Karl Ito on 04/22/2023 12:40:02 -------------------------------------------------------------------------------- Vitals Details Patient Name: Date of Service: Duce, [name] 04/22/2023 3:00 PM Medical Record Number: [id number] Patient Account Number: [id number] Date of Birth/Sex: Treating RN: 1945/12/15 (77 y.o. Marlan Palau Primary Care Dantrell Schertzer: [name] Other Clinician: Referring Davon Folta: Treating Earle Troiano/Extender: Valentina Gu Weeks in Treatment: 45 Vital Signs Time Taken: 15:28 Temperature (F): 97.8 Height (in): 74 Pulse (bpm): 99 Weight (lbs): 250 Respiratory Rate (breaths/min): 18 Body Mass Index (BMI): 32.1 Blood Pressure (mmHg): 169/73 Reference Range: 80 - 120 mg / dl Electronic Signature(s) Signed: 04/22/2023 4:04:24 PM By: Samuella Bruin Entered By: Samuella Bruin on 04/22/2023 12:29:06

## 2023-05-08 NOTE — Progress Notes (Signed)
[name] [name] ([id number]) [id number] Page 1 of 15 Visit Report for 05/06/2023 HPI Details Patient Name: Date of Service: [name] [name] [name] 05/06/2023 3:00 PM Medical Record Number: [id number] Patient Account Number: [id number] Date of Birth/Sex: Treating RN: Mar 28, 1946 (77 y.o. M) Primary Care Provider: Ricki [name] Other Clinician: Referring Provider: Treating Provider/Extender: Valentina Gu Weeks in Treatment: 28 History of Present Illness HPI Description: ADMISSION 02/23/2020 Patient is a 77 year old man who lives in [place] who arrives accompanied by his wife. He has a history of chronic lymphedema and venous insufficiency in his bilateral lower legs which may have something to do that with having a history of DVT as well as being treated for prostate cancer. In any case he recently got compression pumps at home but compliance has been an issue here. He has compression stockings however they are probably not sufficient enough to control swelling. They tell us that things deteriorated for him in late August he was admitted to [facility] for 7 days. This was with cellulitis I think of his bilateral lower legs. Discharge he was noted to have wounds on his bilateral lower legs. He was discharged on Bactrim. They tried to get him home health through [facility] of course they declined him. His wife is been wrapping these applying some form of silver foam dressing. He has a history of wounds before although nothing that would not heal with basic home topical dressings. He has 2 areas on the left medial, left anterior and left lateral and a smaller area on the right medial. All of these have considerable depth. Past medical history includes iron deficiency anemia, lymphedema followed by the rehab center at [facility] with lymphedema wraps I believe, DVT on chronic anticoagulation, prostate cancer, chronic venous  insufficiency, hypertension. As mentioned he has compression pumps but does not use them. ABIs in our clinic were noncompressible bilaterally 10/14; patient with severe bilateral lymphedema right greater than left. He came in with bilateral lower extremity wounds left greater than right. Even though the right side has more of the edema most of the wounds here almost closed on the right medial. He has 3 remaining wounds on the left We have been using silver alginate under 4-layer compression I have been trying to get him to be compliant with his external compression pumps 10/21; patient with 3 small wounds on the left leg and 1 on the right medial in the setting of severe lymphedema and chronic venous insufficiency. We have been using silver alginate under 4-layer compression he is using his external compression pumps twice a day 11/4; ARTERIAL STUDIES on the right show an ABI of 1.02 TBI of 0.858 with biphasic waveforms on the left 0.98 with a TBI of 0.55 and biphasic waveforms. Does not look like he has significant arterial disease. We are treating him for lymphedema he has compression pumps. He has punched-out areas on the left anterior left lateral and right medial lower extremities 11/11; after we obtained his arterial studies I put him in 4 layer compression. He is using his compression pumps probably once a day although I have asked him to do twice. Primary dressing to the wound is silver collagen he has severe lymphedema likely secondary to chronic venous insufficiency. Wounds on the left lateral, left medial and left anterior and a small area on the right medial 12/2; the area on the right anterior lower leg has healed. We initially thought that the area medially had healed as  well however when her discharge nurse came in she detected fluid in the wound simply opened up. This is actually worse than I remember this pain. The area on the left lateral potentially slightly smaller He is also  complaining about pain in his left hand he says that this is actually been getting some better he has been using topical creams on this. She asked that I look at this 12/9 after last weeks issues we have 2 wounds one on the right medial lower leg and 1 on the left lateral. Both of these are in the same condition. I think because of thickened skin secondary to chronic lymphedema these wounds actually have depth of almost 0.8 cm. 12/16; the patient has 2 small but deep wounds one on the right medial and one on the left lateral. The right medial is actually the worst of these. He arrives in clinic today with absolutely terrible edema in the right leg apparently his 4-layer wrap fell down to just above his ankle he did not think about this he is apparently been continuing to use his compression pump twice a day. The left leg looks a lot better. 05/09/2020 upon evaluation today patient appears to be doing decently well in regard to his wounds. Everything is measuring smaller the right leg still has a little bit deeper wound in the left seems to be almost completely healed in my opinion I am very pleased in general with how things are progressing. He has a 4- layer compression wrap we have been using endoform today we will probably have to use collagen just based on the fact that we do not have endoform it is on order. 1/6; the patient's wound on the left lateral lower leg has healed. Still has 1 on the right medial. 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 Using compression pumps at home twice a day. 1/13; left lateral lower leg is still healed. He has a deep punched out rectangular shaped wound on the right medial calf. Looking down at this it appears that he is attempting to epithelialize around the edges of the wound and on the base as well. His edema is reasonably well controlled we have been using collagen with absolutely no effect 1/20; left lateral lower leg remains closed he has  extremitease stockings. The area on the right medial calf I aggressively debrided last week measures larger but the surface looks better. We have been using Hydrofera Blue. We ran Oasis through his insurance but we have not seen the results of this 1/27; left lower leg wound with chronic venous insufficiency and secondary lymphedema. I did aggressive debridement on this last week the wound seems to have come in healthy looking surface using Hydrofera Blue. He was denied for Oasis 2/3; small divot in the right medial lower leg. Under illumination the walls of this divot are epithelialized however the base has slough which I removed with a curette we have been using Hydrofera Blue 2/10 small divot on the right medial lower leg pinpoint illumination at the base of this cone-shaped wound. We have been using Hydrofera Blue but I will switch to calcium alginate this week 2/17; the small divot on the right medial lower leg is fully epithelialized. There is no visible open area under illumination. He has his own stocking for the right leg similar to the one he has been wearing on the left. Logsdon, [name] [name] ([id number]) [id number] Page 2 of 15 03/26/2022; READMISSION This is a now 77 year old man that  we had in the clinic from 02/23/2020 through 07/05/2020. At that point he had bilateral lower extremity wounds left greater than right in the setting of severe lymphedema. He had already obtained compression pumps ordered for him I think from the wound care clinic in [facility] so I do not really have record of what he has been using. He claims to be using them once a day but there is a problem with the sleeve on the left leg. About 2 weeks ago he was hospitalized from 03/11/2022 through 03/14/2022 with diastolic congestive heart failure. 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 He was diuresed. Developed some prerenal azotemia  and he has not been taking any diuretics currently. He has not been putting stockings on his legs since he got out of hospital and still has his legs dependent for long periods. Past medical history history of prostate cancer treated with prostatectomy and radiation this was apparently about 8 years ago, history of DVT on chronic Coumadin, history of lymphedema was managed for a while at the clinic in [place]. History of inguinal hernia repair in September 22, hypertension, stage IIIb chronic renal failure ABIs today were noncompressible on the right 1.12 on the left 04-02-2022 upon evaluation today patient appears to be doing well currently in regard to his legs I do feel like both areas that are draining are actually much drier than they were in the picture last week although the left is drier than the right. He is tolerating the 4-layer compression wraps at this point he did contact the pump company and they are actually working on getting him a new compression sleeve for one of his legs which have previously popped and was not functioning properly. 04-23-2022 upon evaluation today patient appears to be doing well currently in regard to his wounds on the legs. I am actually very pleased with where things stand and I do feel like that we are headed in the right direction. Fortunately there is no sign of active infection locally or systemically at this time. 05-07-2022 upon evaluation today patient appears to be doing well currently in regard to his wounds in fact things are showing signs of improvement which is good news I do not see too much that actually appears to be open and I am very pleased in that regard. No fevers, chills, nausea, vomiting, or diarrhea. 05-21-2022 upon evaluation today patient appears to be doing somewhat poorly in regard to drainage of his lower extremities bilaterally. The right is greater than left as far as the weeping area. Nonetheless it seems to be getting worse not  better. He actually has pitting edema which is at least 2+ to the thighs and I am concerned about the fact that he is may be fluid overloaded in general and that is the reason why we cannot get this under control. I know he is not using his pumps all the time because he actually told the nurse that he was either going to pump or he was going to use his fluid pills but not do both. For that reason I do think that he needs to be really doing both in order to get the fluid out as effectively as possible obviously with the 4-layer compression wraps were doing as much as we can from a compression standpoint but it is really not enough. He tells me that he elevates his leg is much as he can in between pumping and other  activity throughout the day. 05-28-2022 upon evaluation today patient appears to be doing better in regard to his wounds although the measurements may be a little bit larger this is a very difficult wound to heal it is very indistinct in a lot of areas. Nonetheless there is can be some need for sharp debridement in regard to both medial and lateral legs. Fortunately I see no signs of active infection locally nor systemically at this time. No fevers, chills, nausea, vomiting, or diarrhea. 06-04-2022 upon evaluation today patient appears to be doing poorly in general in regard to the wounds on his legs. He still continues to have a tremendous amount of fluid not just in the lower portion of his leg but to be honest his thigh where he has 2-3+ pitting edema in the thigh as well. Unfortunately I do not know that we will be able to get this healed effectively and keep it healed on the lower extremities unless he gets the overall fluid situation taking and under control. Fortunately I do not see any signs of infection locally nor systemically which is great news. He just seems to be very fluid overloaded. 06-11-2022 upon evaluation today patient presents for follow-up concerning his bilateral lower  extremity lymphedema secondary to chronic venous insufficiency. He has been tolerating the dressing changes with the compression wraps without complication. Fortunately I do not see any evidence of infection at this time which is great news. No fevers, chills, nausea, vomiting, or diarrhea. 06-18-2022 upon evaluation today patient appears to be doing well currently in regard to his wounds as far as not looking like they are terribly infected but nonetheless I am concerned about a subacute infection secondary to the fact that he continues to have spreading despite the compression therapy. We actually did do an Unna boot on him last week this is actually the first wrap that actually stayed up everything else has been sliding down quite significantly. Fortunately there does not appear to be any signs of infection systemically at this time. With that being said I do believe that locally there seems to be an issue going on here and again I Ernie Hew do a PCR culture to see what that shows also think that I am going to put him on a broad-spectrum antibiotic, doxycycline to see how that will help as well. He does tell me that coming into the clinic today that he was feeling short of breath like "he was about to have a heart attack" because he was having such a hard time breathing. He says that he told this to Dr. Jodelle Green his cardiologist as well when he was evaluated in the past 1 to 2 weeks. 07-02-2022 upon evaluation today patient appears to be doing poorly currently in regard to his wound. He has been tolerating the dressing changes. Unfortunately he has not had any compression wraps on for the past week because he was unable to make it in for his appointment last week. With that being said he has a significant amount of drainage he tells me has been using his pumps but despite this in the pumps he still has been draining quite a bit. The drainage is also somewhat purulent unfortunately. We did attempt to get in  touch with his cardiologist last week unfortunately we were unable to get up with him I did advise that the patient needs to get in touch with him upon leaving today in order to make sure they know he is on the new antibiotics I  am going to send him this will be Levaquin and Augmentin. 07-09-2022 upon evaluation today patient appears to be doing about the same in regard to his legs he may have just a slight amount of improvement with regard to the drainage probably Keystone topical antibiotics are helping in this regard to some degree. Fortunately there does not appear to be any signs of active infection systemically which is great news. No fevers, chills, nausea, vomiting, or diarrhea. 07-16-2022 upon evaluation today patient appears to be doing well currently in regard to his wound. He has been tolerating the dressing changes without complication. Fortunately there does not appear to be any signs of active infection locally nor systemically at this time. With that being said he cannot keep the wraps up he tells me on the left side he had to cut this down because it got too tight. He has been using his pumps but he is on the right side the wrap actually straight down causing some pushing around the central part of his leg just below the calf I think this is a bigger risk for him that help at this point. I think that we may need to try something different he should be getting his compression socks shortly he tells me they were ordered last Thursday. 3/6; ; this is a patient who lives in [place]. He has severe bilateral lymphedema. He has compression pumps, we have been using kerlix Ace wrap Keystone. He is changing the dressing. We do not have home health. 08-06-2022 upon evaluation today patient appears to be doing a little better in regard to his wounds in general at this point. Fortunately there does not appear to be any signs of active infection locally nor systemically at this time which is great  news and overall I am extremely pleased with where we stand today. 08-13-2022 upon evaluation today patient appears to actually be doing significantly better compared to last week. He actually did go to the hospital I told him that he needed to when he left here and he actually did go. With that being said they actually ended up admitting him he was having shortness of breath and I thought it might be related to congestive heart failure turns out he actually had a pulmonary embolism. Subsequently they were able to get him off of the Coumadin switching over to Eliquis to get things stabilized in that regard they also had them wrapped and got his swelling under control on his legs he actually looks much better pretty much across the board at this point. I am very pleased in that regard. With that being said I am very happy that he finally went that could have been a very dangerous situation. 08-20-2022 upon evaluation today patient appears to be doing well currently in regard to his wound. Has been tolerating the dressing changes without complication. Fortunately there does not appear to be any signs of active infection locally nor systemically at this time. I think his legs are doing better there is some need for sharp debridement today. Thompson, [name] [name] ([id number]) [id number] Page 3 of 15 08-27-2022 upon evaluation patient is actually making excellent progress. I am actually very pleased with where he stands and I think that he is moving in the right direction. In general I think that we are looking pretty good at the moment. 09-03-2022 upon evaluation today patient appears to be doing well currently in regard to his wound. He is actually tolerating dressing changes on the left and right leg  without complication. Fortunately I do not see any need for debridement of the left leg the right leg I think we probably do some need to perform some debridement here. 09-10-2022 upon  evaluation today patient's wounds actually showed signs of improvement in both legs I do not see much is going require debridement today which was great news. Fortunately I do not see any evidence of infection which I think is also excellent he seems to be using his pumps and doing everything right I am happy about how this is progressing at this point. 09-17-2022 upon evaluation today patient appears to be doing decently well in regard to his wounds. He has been tolerating the dressing changes without complication. Fortunately there does not appear to be any signs of active infection at this time which is good news. 09-24-2022 upon evaluation today patient appears to be doing well currently in regard to his wounds. He has been tolerating the dressing changes without complication. Fortunately there does not appear to be any signs of active infection locally nor systemically which is great news. No fevers, chills, nausea, vomiting, or diarrhea. 10-08-2022 upon evaluation today patient appears to be doing excellent currently in regard to his wound. He has been tolerating the dressing changes without complication though it does not sound like he has been using his compression wraps for a bit here. He does think he was doing better with the [facility] topical antibiotics we can definitely go back to using that but I think the biggest issue here is that his swelling is just very out of control and needs to be under control. I discussed that with him today. 10-15-2022 upon evaluation today patient appears to be doing well currently in regard to his wounds. He is actually making some progress which is good news. Fortunately I do not see any evidence of active infection locally nor systemically which is great news as well. No fevers, chills, nausea, vomiting, or diarrhea. He does have a callused area on the plantar aspect of his left foot which is actually causing some pain and he wonders if I can trim this down for  him. 10-22-2022 upon evaluation today patient seems to be making progress. He is actually doing quite well and very pleased in that regard. I do not see any signs of active infection at this time. 11-05-2022 upon evaluation patient appears to be making progress although slowly towards closure. He seems to be doing well with regard to his legs were still using the [facility] topical antibiotics and he seems to be doing quite well. He is going require some sharp debridement today. 11-19-2022 upon evaluation today patient unfortunately appears to be extremely swollen at this point. He tells me that he ran out of supplies he also tells me his leg started leaking more because of not having supplies he was unable to wear his compression wraps that is the juxta fit compression wraps. Therefore his legs are extremely swollen much larger than normal and do not appear to be doing well at all today. He is going require some debridement I also think he is going require [place] to perform compression wrapping today. 11-26-2022 upon evaluation today patient appears to be doing well currently in regard to his legs as far as infection is concerned I see nothing that appears to be infected. Fortunately I do not see any signs of active infection systemically either which is also good news. With that being said he still is extremely swollen as far as his legs  are concerned. I do not see any signs of overall worsening but also do not see any signs of significant improvement which is the major issue here. 12-03-2022 upon evaluation today patient appears to be doing poorly still in regard to his legs the apparently has been taken off the wraps and not leaving the week by week. With that being said he has not had really the dressings to put on the release that is running out and subsequently though he is using his wraps I am not sure that he has been keeping them on like he needs to. I explained to him I really wanted to have the wraps  that I put on him left on until he comes back to see me so that we can keep the compression under better control. He voiced understanding today he tells me that he was "confused about that". Nonetheless based on what I am seeing right now also think that he is up on his feet too much I think he needs to get the feet elevated. 7/24; this is a patient with severe bilateral stage III lymphedema. On the right lower leg medially is a large area of macerated denuded skin was too small deeper areas in this. On the left leg he is 2 areas that are a little more standard in terms of wounds. Massive lymphedema bilaterally. He has compression pumps at home which he uses for 1 hour twice a day 12-17-2022 upon evaluation today patient's legs though a little bit smaller still continue to have significant issues with weeping here this just does not dry. I really think he needs to be changing this daily and this means we will probably have to try to go back to the Velcro wraps and give this a shot. I think may be going to the Velcro wraps, changing the dressing to a superabsorber like XtraSorb or the like, and have him elevate his legs and do his pumps 3 times per day is probably going to be the way to go to try to see if we can make some improvement here. 12-24-2022 upon evaluation today patient unfortunately is still struggling to get anything under control. We have been extremely aggressive and trying to: Control his swelling everything we do however seems to be met with resistance. He tells me that he is pumping 3 times per day with his lymphedema pumps. He also tells me that he is try to elevate his legs is much as possible and that subsequently he has been keeping his compression wraps on. He goes back and forth between saying that he can keep our wraps on and not but right now I really do not see that he is doing well with the juxta fit is at least not from the standpoint of improving the overall appearance I think  it does to some degree help maintain but we are still struggling here. I have voiced a concern to the patient that despite everything you are doing I am still not seeing a lot of improvement here and to be honest I am not sure what is can take get this under control he may have to go into the hospital for admission. 12-31-2022 upon evaluation today patient appears to be doing well currently in regard to his wounds. They seem a little better although he has several pustules around the legs in general but has been concerned about infection. I am actually going to go ahead and see about getting an antibiotic sent into the pharmacy today  although I am going to obtain a PCR culture as well. 01-07-2023 upon evaluation today patient appears to be doing well currently in regard to his wound. Has been tolerating the dressing changes without complication. He actually seems to be doing much better. He did have a PCR culture which showed Serratia as well and the prominent organisms here. Levaquin was actually a good option to treat the Serratia along with the other organisms that were problematic including E. coli. Nonetheless I think coupled with the topical Keystone antibiotics and the alginate he is really doing quite well today. 8/28; patient with severe bilateral lymphedema he has wounds bilaterally on his lower legs but they look a lot better today. We are using silver alginate and Keystone with Urgo K2 lite wraps. He comes in today with his wounds looking quite good. He was prescribed Levaquin last time I think he will complete this in a few days. 01-21-2023 upon evaluation today patient appears to be doing worse in regard to his swelling took his wrap off he tells me he believes on Saturday. With that being said his leg is significantly swollen since that time and unfortunately does not appear to be doing nearly as good as it was last time I saw him 2 weeks ago or even last week for that matter. I am very  concerned about this. 01-28-2023 upon evaluation today patient appears to be doing poorly currently in regard to his legs. He has been doing everything he says that we have recommended, he continues to use his lymphedema pumps 3 times a day, he has been doubling up on his fluid pills, he has been elevating his legs, and he tells me that he also has kept the wraps on and in fact he did have them on the day when he came in. Again that for feels the compression side of things. Nonetheless with everything going on here we still have not been able to get this completely under control. He continues to have issues with significant swelling of lower extremities and this is not limited to just his lower left portion of his legs but his thighs are fairly large as well. With that being said I really feel like that we have reached the extent of what we have to offer here at the wound care center I am wondering if we can see about making a referral for second opinion to [facility] to see if there is anything they could do to help him out at this point. It is really not much further from his home to there as it is from his home to here. Both are about an hour distance. He lives in [place]. Vaeth, [name] [name] ([id number]) [id number] Page 4 of 15 02-04-2023 upon evaluation today patient appears to be doing much better actually compared to last week's evaluation. This is actually significant improvement and overall I do not know exactly what he did different other than he tells me that he "has been taking his fluid pills every day instead of every other day. Obviously this is needed. I was under the understanding previously that he was taking this every day but I think he may also not been pumping every day like he was telling me but I cannot be sure this either way he does seem to be doing what he supposed to do at this point. 02-11-2023 upon evaluation today patient unfortunately  appears to be about as bad today as he appeared good last week. I am not sure  what happened other than the fact he did take his wrap off the right leg he tells me it got so tight that he could not feel his toes they are getting very numb. Subsequently he tells me that it was difficulty with driving therefore he took it off. With that being said I do believe that he may need to go to the ER for evaluation and treatment I feel he may need to be admitted to get some of the fluid off of his legs. I think that he is volume overloaded and at this point I do not think there is much working to be able to do to manage this outpatient we have tried pretty much everything that I am going to do. 02-18-23 upon evaluation today patient appears to be doing pretty well currently compared to last week this is actually better today. He is less weeping and the legs are actually significantly smaller. He tells me has been trying to stay in bed with his legs elevated more. 02-25-2023 upon evaluation today patient appears to be doing well currently in regard to his legs in general compared to where we have been. Fortunately I do not see any signs of active infection locally or systemically which is great news and in general I do believe that we are making good headway here towards complete closure. I am hopeful that he will continue to show signs of improvement as we progress going forward. He does seem to have been taking his fluid pills as well as elevating his legs and using lymphedema pumps on a regular basis. He is trying to stay out of the hospital. 03-04-2023 upon evaluation today patient appears to be doing well currently in regard to his wound. He has been tolerating the dressing changes without complication. Fortunately there does not appear to be any signs of active infection at this time. 03-11-2023 upon evaluation today patient appears to be doing well currently in regard to his wounds. He has been tolerating the  dressing changes without complication. Fortunately I do not see any signs of active infection at this time which is great news. 11/6; patient missed his clinic appointment last week. He took his wraps off at some point. Did not take his diuretics because he was nauseated last week. He comes in with massive bilateral edema which is lymphedema and probably some degree of systemic fluid overload. 11/13; patient with very poorly controlled lymphedema. We have been putting 4-layer equivalent wraps on him he says he is using his compression pumps twice a day. And elevating his legs nevertheless the edema control today is very poor. He has a substantial wound on the right medial lower leg also of the left medial and left posterior. We have been using alginate as a primary dressing or Aquacel Ag 11/20; some improvement in the amount of edema. We have been using 4-layer equivalent wraps and he is pumping with his external compression pumps twice a day. His legs had improved in terms of edema although there is still massively edematous. He tells me that he is on torsemide 1-1/2 tablets 20 mg tabs a day 12/4; massive edema extending up into his groin. Interestingly his edema in the thighs is more pitting then nonpitting lymphedema. His wounds are on both legs. This is not something that I think we can debride although I did do this last week I am not sure this did him any good. We have been using 4-layer compression he is using his pumps twice a  day. I misunderstood his torsemide dosage which apparently is 1 tablet alternating with a half a tablet daily. I think he probably needs more than that. Unfortunately he cannot get a hold of his cardiologist... 12/11 massive edema in the bilateral lower legs extending up into his posterior thighs towards his groin. He tells me he took his compression wrap off on Sunday. He says he is using his external compression pumps twice a day. He has wounds predominantly on the  right medial leg left lateral left anterior and left medial leg. The patient states he has tried to contact his cardiologist vis--vis diuretic use and he just cannot get a hold of anybody in the office 12/18; massive edema in his bilateral lower legs extending up into his groin. He has an elevated jugular venous pressure. He tells me his torsemide was increased to 30 mg he takes 130 mg tablet. He has wounds on the right medial left medial left anterior and left lateral lower legs they are covered in a nonviable surface but with this amount of edema debridement just is not indicated. He says he is using his compression pumps 3 times a day he had to cut down the wraps because they were too loose. Electronic Signature(s) Signed: 05/07/2023 12:11:14 PM By: Baltazar Najjar MD Entered By: Baltazar Najjar on 05/06/2023 16:29:07 -------------------------------------------------------------------------------- Physical Exam Details Patient Name: Date of Service: [name] [name] [name] 05/06/2023 3:00 PM Medical Record Number: [id number] Patient Account Number: [id number] Date of Birth/Sex: Treating RN: Nov 13, 1945 (77 y.o. M) Primary Care Provider: Ricki [name] Other Clinician: Referring Provider: Treating Provider/Extender: Valentina Gu Weeks in Treatment: 87 Constitutional Patient is hypertensive.. Pulse regular and within target range for patient.Marland Kitchen Respirations regular, non-labored and within target range.. Temperature is normal and within the target range for the patient.Marland Kitchen Appears in no distress. Respiratory Shallow but clear. Notes Wound exam; right medial lower leg, left medial left posterior lower legs. All of this covered in a necrotic surface. Massive edema which is mostly nonpitting Bartol, Woody ([id number]) [id number] Page 5 of 15 well up into his groin. Cardiac exam suggests fluid overload with elevated jugular venous pressure, probable  tricuspid regurgitation. He does not have sacral edema however no S3 Electronic Signature(s) Signed: 05/07/2023 12:11:14 PM By: Baltazar Najjar MD Entered By: Baltazar Najjar on 05/06/2023 16:30:18 -------------------------------------------------------------------------------- Physician Orders Details Patient Name: Date of Service: [name] [name] [name] 05/06/2023 3:00 PM Medical Record Number: [id number] Patient Account Number: [id number] Date of Birth/Sex: Treating RN: [birth year] (77 y.o. Cline Cools Primary Care Provider: Ricki [name] Other Clinician: Referring Provider: Treating Provider/Extender: Valentina Gu Weeks in Treatment: 82 Verbal / Phone Orders: No Diagnosis Coding Follow-up Appointments Return appointment in 3 weeks. [name], PA Other: - take your fluid pills as prescribed. Speak with cardiologist about increasing fluid pills T wraps off in 1 wk, dress wounds yourself, wear stockings, continue to pump 3x day ake Anesthetic (In clinic) Topical Lidocaine 4% applied to wound bed Bathing/ Shower/ Hygiene May shower with protection but do not get wound dressing(s) wet. Protect dressing(s) with water repellant cover (for example, large plastic bag) or a cast cover and may then take shower. Edema Control - Orders / Instructions Segmental Compressive Device. Use the Segmental Compressive Device on leg(s) 2-3 times a day for 45 - 60 minutes. If wearing any wraps or hose, do not remove them. Continue exercising as instructed. - x3 a day Elevate legs to the level of the heart or  above for 30 minutes daily and/or when sitting for 3-4 times a day throughout the day. Avoid standing for long periods of time. Exercise regularly Off-Loading Other: - When sitting please keep your feet up, keep legs at Heart Level or above. Use pillows behind calf to for comfort. Additional Orders / Instructions Follow Nutritious Diet - Try and increase protein intake.  Goal of protein intake is 60g-100g. Lymphedema Treatment Plan - Exercise, Compression and Elevation Bilateral Lower Extremities Exercise daily as tolerated. (Walking, ROM, Calf Pumps and Toe Taps) Elevate legs 30 - 60 minutes at or above heart level at least 3 - 4 times daily as able/tolerated Avoid standing for long periods and elevate leg(s) parallel to the floor when sitting Use Pneumatic Compression Device on leg(s) 2-3 times a day for 45-60 minutes - x3 a day Wound Treatment Wound #12 - Lower Leg Wound Laterality: Left, Anterior Cleanser: Soap and Water Every Other Day/30 Days Discharge Instructions: May shower and wash wound with dial antibacterial soap and water prior to dressing change. Peri-Wound Care: Sween Lotion (Moisturizing lotion) Every Other Day/30 Days Discharge Instructions: Apply moisturizing lotion as directed Prim Dressing: Maxorb Extra CMC/Alginate Dressing, 4x4 (in/in) (DME) (Generic) Every Other Day/30 Days ary Discharge Instructions: Apply to wound bed as instructed Secondary Dressing: ABD Pad, 8x10 Every Other Day/30 Days Discharge Instructions: Apply over primary dressing as directed. Secondary Dressing: Zetuvit Plus 4x8 in Every Other Day/30 Days Discharge Instructions: Apply over primary dressing as directed. Secondary Dressing: OptiLock Super Absorbent, 5x5.5 (in/in) (Generic) Every Other Day/30 Days Broughton, [name] [name] ([id number]) [id number] Page 6 of 15 Discharge Instructions: Apply directly to wound bed as directed Compression Wrap: Urgo K2, (equivalent to a 4 layer) two layer compression system, regular Every Other Day/30 Days Discharge Instructions: Apply Urgo K2 as directed (alternative to 4 layer compression). Wound #15 - Lower Leg Wound Laterality: Left, Posterior, Distal Cleanser: Soap and Water Every Other Day/30 Days Discharge Instructions: May shower and wash wound with dial antibacterial soap and water prior to dressing  change. Peri-Wound Care: Sween Lotion (Moisturizing lotion) Every Other Day/30 Days Discharge Instructions: Apply moisturizing lotion as directed Prim Dressing: Maxorb Extra CMC/Alginate Dressing, 4x4 (in/in) (DME) (Generic) Every Other Day/30 Days ary Discharge Instructions: Apply to wound bed as instructed Secondary Dressing: ABD Pad, 8x10 Every Other Day/30 Days Discharge Instructions: Apply over primary dressing as directed. Secondary Dressing: Zetuvit Plus 4x8 in Every Other Day/30 Days Discharge Instructions: Apply over primary dressing as directed. Secondary Dressing: OptiLock Super Absorbent, 5x5.5 (in/in) (Generic) Every Other Day/30 Days Discharge Instructions: Apply directly to wound bed as directed Compression Wrap: Urgo K2, (equivalent to a 4 layer) two layer compression system, regular Every Other Day/30 Days Discharge Instructions: Apply Urgo K2 as directed (alternative to 4 layer compression). Wound #16 - Lower Leg Wound Laterality: Left, Lateral Cleanser: Soap and Water Every Other Day/30 Days Discharge Instructions: May shower and wash wound with dial antibacterial soap and water prior to dressing change. Peri-Wound Care: Sween Lotion (Moisturizing lotion) Every Other Day/30 Days Discharge Instructions: Apply moisturizing lotion as directed Prim Dressing: Maxorb Extra CMC/Alginate Dressing, 4x4 (in/in) (Generic) Every Other Day/30 Days ary Discharge Instructions: Apply to wound bed as instructed Secondary Dressing: ABD Pad, 8x10 Every Other Day/30 Days Discharge Instructions: Apply over primary dressing as directed. Secondary Dressing: Zetuvit Plus 4x8 in Every Other Day/30 Days Discharge Instructions: Apply over primary dressing as directed. Secondary Dressing: OptiLock Super Absorbent, 5x5.5 (in/in) (Generic) Every Other Day/30 Days Discharge Instructions: Apply directly to wound  bed as directed Compression Wrap: Urgo K2, (equivalent to a 4 layer) two layer compression  system, regular Every Other Day/30 Days Discharge Instructions: Apply Urgo K2 as directed (alternative to 4 layer compression). Wound #6 - Lower Leg Wound Laterality: Right, Medial Cleanser: Soap and Water Every Other Day/30 Days Discharge Instructions: May shower and wash wound with dial antibacterial soap and water prior to dressing change. Peri-Wound Care: Sween Lotion (Moisturizing lotion) Every Other Day/30 Days Discharge Instructions: Apply moisturizing lotion as directed Prim Dressing: Maxorb Extra CMC/Alginate Dressing, 4x4 (in/in) (DME) (Generic) Every Other Day/30 Days ary Discharge Instructions: Apply to wound bed as instructed Secondary Dressing: ABD Pad, 8x10 Every Other Day/30 Days Discharge Instructions: Apply over primary dressing as directed. Secondary Dressing: Zetuvit Plus 4x8 in Every Other Day/30 Days Discharge Instructions: Apply over primary dressing as directed. Secondary Dressing: OptiLock Super Absorbent, 5x5.5 (in/in) (Generic) Every Other Day/30 Days Discharge Instructions: Apply directly to wound bed as directed Compression Wrap: Urgo K2, (equivalent to a 4 layer) two layer compression system, regular Every Other Day/30 Days Discharge Instructions: Apply Urgo K2 as directed (alternative to 4 layer compression). Wound #7 - Lower Leg Wound Laterality: Left, Medial Cleanser: Soap and Water Every Other Day/30 Days Discharge Instructions: May shower and wash wound with dial antibacterial soap and water prior to dressing change. [name] [name] ([id number]) [id number] Page 7 of 15 Peri-Wound Care: Sween Lotion (Moisturizing lotion) Every Other Day/30 Days Discharge Instructions: Apply moisturizing lotion as directed Prim Dressing: Maxorb Extra CMC/Alginate Dressing, 4x4 (in/in) Every Other Day/30 Days ary Discharge Instructions: Apply to wound bed as instructed Secondary Dressing: ABD Pad, 8x10 Every Other Day/30 Days Discharge  Instructions: Apply over primary dressing as directed. Secondary Dressing: Zetuvit Plus 4x8 in Every Other Day/30 Days Discharge Instructions: Apply over primary dressing as directed. Secondary Dressing: OptiLock Super Absorbent, 5x5.5 (in/in) (Generic) Every Other Day/30 Days Discharge Instructions: Apply directly to wound bed as directed Compression Wrap: Urgo K2, (equivalent to a 4 layer) two layer compression system, regular Every Other Day/30 Days Discharge Instructions: Apply Urgo K2 as directed (alternative to 4 layer compression). Patient Medications llergies: cephalexin, mometasone furoate, doxycycline monohydrate A Notifications Medication Indication Start End 05/06/2023 lidocaine DOSE topical 4 % cream - cream topical once daily Electronic Signature(s) Signed: 05/07/2023 12:11:14 PM By: Baltazar Najjar MD Signed: 05/07/2023 5:51:07 PM By: Redmond Pulling RN, [name] Previous Signature: 05/06/2023 5:04:26 PM Version By: Redmond Pulling RN, [name] Entered By: Redmond Pulling on 05/06/2023 17:14:14 -------------------------------------------------------------------------------- Problem List Details Patient Name: Date of Service: [name] [name] [name] 05/06/2023 3:00 PM Medical Record Number: [id number] Patient Account Number: [id number] Date of Birth/Sex: Treating RN: 12-Jan-1946 (77 y.o. 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 hypertension (idiopathic) with ulcer and inflammation of 06/11/2022 No Yes bilateral lower extremity I89.0 Lymphedema, not elsewhere classified 03/26/2022 No Yes L97.828 Non-pressure chronic ulcer of other part of left lower leg with other specified 03/26/2022 No Yes severity L97.818 Non-pressure chronic ulcer of other part of right lower leg with  other specified 03/26/2022 No Yes severity [place]on, [name] ([id number]) [id number] Page 8 of 15 I50.42 Chronic combined systolic (congestive) and diastolic (congestive) heart failure 05/06/2023 No Yes Inactive Problems ICD-10 Code Description Active Date Inactive Date L84 Corns and callosities 10/15/2022 10/15/2022 Resolved Problems Electronic Signature(s) Signed: 05/07/2023 12:11:14 PM By: Baltazar Najjar MD Entered By: Baltazar Najjar  on 05/06/2023 16:27:49 -------------------------------------------------------------------------------- Progress Note Details Patient Name: Date of Service: [name] [name] [name] 05/06/2023 3:00 PM Medical Record Number: [id number] Patient Account Number: [id number] Date of Birth/Sex: Treating RN: 1945-08-17 (77 y.o. M) Primary Care Provider: Ricki [name] Other Clinician: Referring Provider: Treating Provider/Extender: Valentina Gu Weeks in Treatment: 42 Subjective History of Present Illness (HPI) ADMISSION 02/23/2020 Patient is a 77 year old man who lives in [place] who arrives accompanied by his wife. He has a history of chronic lymphedema and venous insufficiency in his bilateral lower legs which may have something to do that with having a history of DVT as well as being treated for prostate cancer. In any case he recently got compression pumps at home but compliance has been an issue here. He has compression stockings however they are probably not sufficient enough to control swelling. They tell us that things deteriorated for him in late August he was admitted to [facility] for 7 days. This was with cellulitis I think of his bilateral lower legs. Discharge he was noted to have wounds on his bilateral lower legs. He was discharged on Bactrim. They tried to get him home health through [facility] of course they declined him. His wife is been wrapping these applying some form of silver  foam dressing. He has a history of wounds before although nothing that would not heal with basic home topical dressings. He has 2 areas on the left medial, left anterior and left lateral and a smaller area on the right medial. All of these have considerable depth. Past medical history includes iron deficiency anemia, lymphedema followed by the rehab center at [facility] with lymphedema wraps I believe, DVT on chronic anticoagulation, prostate cancer, chronic venous insufficiency, hypertension. As mentioned he has compression pumps but does not use them. ABIs in our clinic were noncompressible bilaterally 10/14; patient with severe bilateral lymphedema right greater than left. He came in with bilateral lower extremity wounds left greater than right. Even though the right side has more of the edema most of the wounds here almost closed on the right medial. He has 3 remaining wounds on the left We have been using silver alginate under 4-layer compression I have been trying to get him to be compliant with his external compression pumps 10/21; patient with 3 small wounds on the left leg and 1 on the right medial in the setting of severe lymphedema and chronic venous insufficiency. We have been using silver alginate under 4-layer compression he is using his external compression pumps twice a day 11/4; ARTERIAL STUDIES on the right show an ABI of 1.02 TBI of 0.858 with biphasic waveforms on the left 0.98 with a TBI of 0.55 and biphasic waveforms. Does not look like he has significant arterial disease. We are treating him for lymphedema he has compression pumps. He has punched-out areas on the left anterior left lateral and right medial lower extremities 11/11; after we obtained his arterial studies I put him in 4 layer compression. He is using his compression pumps probably once a day although I have asked him to do twice. Primary dressing to the wound is silver collagen he has severe lymphedema likely  secondary to chronic venous insufficiency. Wounds on the left lateral, left medial and left anterior and a small area on the right medial 12/2; the area on the right anterior lower leg has healed. We initially thought that the area medially had healed as well however when her discharge nurse  came in she detected fluid in the wound simply opened up. This is actually worse than I remember this pain. The area on the left lateral potentially slightly smaller He is also complaining about pain in his left hand he says that this is actually been getting some better he has been using topical creams on this. She asked that I look at this 12/9 after last weeks issues we have 2 wounds one on the right medial lower leg and 1 on the left lateral. Both of these are in the same condition. I think because of thickened skin secondary to chronic lymphedema these wounds actually have depth of almost 0.8 cm. 12/16; the patient has 2 small but deep wounds one on the right medial and one on the left lateral. The right medial is actually the worst of these. He arrives in clinic today with absolutely terrible edema in the right leg apparently his 4-layer wrap fell down to just above his ankle he did not think about this he is apparently been continuing to use his compression pump twice a day. The left leg looks a lot better. Jelley, [name] [name] ([id number]) [id number] Page 9 of 15 05/09/2020 upon evaluation today patient appears to be doing decently well in regard to his wounds. Everything is measuring smaller the right leg still has a little bit deeper wound in the left seems to be almost completely healed in my opinion I am very pleased in general with how things are progressing. He has a 4- layer compression wrap we have been using endoform today we will probably have to use collagen just based on the fact that we do not have endoform it is on order. 1/6; the patient's wound on the left lateral  lower leg has healed. Still has 1 on the right medial. 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 Using compression pumps at home twice a day. 1/13; left lateral lower leg is still healed. He has a deep punched out rectangular shaped wound on the right medial calf. Looking down at this it appears that he is attempting to epithelialize around the edges of the wound and on the base as well. His edema is reasonably well controlled we have been using collagen with absolutely no effect 1/20; left lateral lower leg remains closed he has extremitease stockings. The area on the right medial calf I aggressively debrided last week measures larger but the surface looks better. We have been using Hydrofera Blue. We ran Oasis through his insurance but we have not seen the results of this 1/27; left lower leg wound with chronic venous insufficiency and secondary lymphedema. I did aggressive debridement on this last week the wound seems to have come in healthy looking surface using Hydrofera Blue. He was denied for Oasis 2/3; small divot in the right medial lower leg. Under illumination the walls of this divot are epithelialized however the base has slough which I removed with a curette we have been using Hydrofera Blue 2/10 small divot on the right medial lower leg pinpoint illumination at the base of this cone-shaped wound. We have been using Hydrofera Blue but I will switch to calcium alginate this week 2/17; the small divot on the right medial lower leg is fully epithelialized. There is no visible open area under illumination. He has his own stocking for the right leg similar to the one he has been wearing on the left. 03/26/2022; READMISSION This is a now 77 year old man that we had in the clinic from 02/23/2020  through 07/05/2020. At that point he had bilateral lower extremity wounds left greater than right in the setting of severe lymphedema. He had already obtained compression pumps ordered  for him I think from the wound care clinic in [facility] so I do not really have record of what he has been using. He claims to be using them once a day but there is a problem with the sleeve on the left leg. About 2 weeks ago he was hospitalized from 03/11/2022 through 03/14/2022 with diastolic congestive heart failure. 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 He was diuresed. Developed some prerenal azotemia and he has not been taking any diuretics currently. He has not been putting stockings on his legs since he got out of hospital and still has his legs dependent for long periods. Past medical history history of prostate cancer treated with prostatectomy and radiation this was apparently about 8 years ago, history of DVT on chronic Coumadin, history of lymphedema was managed for a while at the clinic in [place]. History of inguinal hernia repair in September 22, hypertension, stage IIIb chronic renal failure ABIs today were noncompressible on the right 1.12 on the left 04-02-2022 upon evaluation today patient appears to be doing well currently in regard to his legs I do feel like both areas that are draining are actually much drier than they were in the picture last week although the left is drier than the right. He is tolerating the 4-layer compression wraps at this point he did contact the pump company and they are actually working on getting him a new compression sleeve for one of his legs which have previously popped and was not functioning properly. 04-23-2022 upon evaluation today patient appears to be doing well currently in regard to his wounds on the legs. I am actually very pleased with where things stand and I do feel like that we are headed in the right direction. Fortunately there is no sign of active infection locally or systemically at this time. 05-07-2022 upon evaluation today patient appears to be doing well currently in  regard to his wounds in fact things are showing signs of improvement which is good news I do not see too much that actually appears to be open and I am very pleased in that regard. No fevers, chills, nausea, vomiting, or diarrhea. 05-21-2022 upon evaluation today patient appears to be doing somewhat poorly in regard to drainage of his lower extremities bilaterally. The right is greater than left as far as the weeping area. Nonetheless it seems to be getting worse not better. He actually has pitting edema which is at least 2+ to the thighs and I am concerned about the fact that he is may be fluid overloaded in general and that is the reason why we cannot get this under control. I know he is not using his pumps all the time because he actually told the nurse that he was either going to pump or he was going to use his fluid pills but not do both. For that reason I do think that he needs to be really doing both in order to get the fluid out as effectively as possible obviously with the 4-layer compression wraps were doing as much as we can from a compression standpoint but it is really not enough. He tells me that he elevates his leg is much as he can in between pumping and other activity throughout the day. 05-28-2022 upon evaluation  today patient appears to be doing better in regard to his wounds although the measurements may be a little bit larger this is a very difficult wound to heal it is very indistinct in a lot of areas. Nonetheless there is can be some need for sharp debridement in regard to both medial and lateral legs. Fortunately I see no signs of active infection locally nor systemically at this time. No fevers, chills, nausea, vomiting, or diarrhea. 06-04-2022 upon evaluation today patient appears to be doing poorly in general in regard to the wounds on his legs. He still continues to have a tremendous amount of fluid not just in the lower portion of his leg but to be honest his thigh where he has  2-3+ pitting edema in the thigh as well. Unfortunately I do not know that we will be able to get this healed effectively and keep it healed on the lower extremities unless he gets the overall fluid situation taking and under control. Fortunately I do not see any signs of infection locally nor systemically which is great news. He just seems to be very fluid overloaded. 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 He has been tolerating the dressing changes with the compression wraps without complication. Fortunately I do not see any evidence of infection at this time which is great news. No fevers, chills, nausea, vomiting, or diarrhea. 06-18-2022 upon evaluation today patient appears to be doing well currently in regard to his wounds as far as not looking like they are terribly infected but nonetheless I am concerned about a subacute infection secondary to the fact that he continues to have spreading despite the compression therapy. We actually did do an Unna boot on him last week this is actually the first wrap that actually stayed up everything else has been sliding down quite significantly. Fortunately there does not appear to be any signs of infection systemically at this time. With that being said I do believe that locally there seems to be an issue going on here and again I Ernie Hew do a PCR culture to see what that shows also think that I am going to put him on a broad-spectrum antibiotic, doxycycline to see how that will help as well. He does tell me that coming into the clinic today that he was feeling short of breath like "he was about to have a heart attack" because he was having such a hard time breathing. He says that he told this to Dr. Jodelle Green his cardiologist as well when he was evaluated in the past 1 to 2 weeks. 07-02-2022 upon evaluation today patient appears to be doing poorly  currently in regard to his wound. He has been tolerating the dressing changes. Unfortunately he has not had any compression wraps on for the past week because he was unable to make it in for his appointment last week. With that being said he has a significant amount of drainage he tells me has been using his pumps but despite this in the pumps he still has been draining quite a bit. The drainage is also somewhat purulent unfortunately. We did attempt to get in touch with his cardiologist last week unfortunately we were unable to get up with him I did advise that the patient needs to get in touch with him upon leaving today in order to make sure they know he is on the new antibiotics I am going to send him this will  be Levaquin and Augmentin. 07-09-2022 upon evaluation today patient appears to be doing about the same in regard to his legs he may have just a slight amount of improvement with regard to the drainage probably Keystone topical antibiotics are helping in this regard to some degree. Fortunately there does not appear to be any signs of active infection systemically which is great news. No fevers, chills, nausea, vomiting, or diarrhea. [name] [name] ([id number]) [id number] Page 10 of 15 07-16-2022 upon evaluation today patient appears to be doing well currently in regard to his wound. He has been tolerating the dressing changes without complication. Fortunately there does not appear to be any signs of active infection locally nor systemically at this time. With that being said he cannot keep the wraps up he tells me on the left side he had to cut this down because it got too tight. He has been using his pumps but he is on the right side the wrap actually straight down causing some pushing around the central part of his leg just below the calf I think this is a bigger risk for him that help at this point. I think that we may need to try something different he should be  getting his compression socks shortly he tells me they were ordered last Thursday. 3/6; ; this is a patient who lives in [place]. He has severe bilateral lymphedema. He has compression pumps, we have been using kerlix Ace wrap Keystone. He is changing the dressing. We do not have home health. 08-06-2022 upon evaluation today patient appears to be doing a little better in regard to his wounds in general at this point. Fortunately there does not appear to be any signs of active infection locally nor systemically at this time which is great news and overall I am extremely pleased with where we stand today. 08-13-2022 upon evaluation today patient appears to actually be doing significantly better compared to last week. He actually did go to the hospital I told him that he needed to when he left here and he actually did go. With that being said they actually ended up admitting him he was having shortness of breath and I thought it might be related to congestive heart failure turns out he actually had a pulmonary embolism. Subsequently they were able to get him off of the Coumadin switching over to Eliquis to get things stabilized in that regard they also had them wrapped and got his swelling under control on his legs he actually looks much better pretty much across the board at this point. I am very pleased in that regard. With that being said I am very happy that he finally went that could have been a very dangerous situation. 08-20-2022 upon evaluation today patient appears to be doing well currently in regard to his wound. Has been tolerating the dressing changes without complication. Fortunately there does not appear to be any signs of active infection locally nor systemically at this time. I think his legs are doing better there is some need for sharp debridement today. 08-27-2022 upon evaluation patient is actually making excellent progress. I am actually very pleased with where he stands and I think that  he is moving in the right direction. In general I think that we are looking pretty good at the moment. 09-03-2022 upon evaluation today patient appears to be doing well currently in regard to his wound. He is actually tolerating dressing changes on the left and right leg without complication. Fortunately I do not  see any need for debridement of the left leg the right leg I think we probably do some need to perform some debridement here. 09-10-2022 upon evaluation today patient's wounds actually showed signs of improvement in both legs I do not see much is going require debridement today which was great news. Fortunately I do not see any evidence of infection which I think is also excellent he seems to be using his pumps and doing everything right I am happy about how this is progressing at this point. 09-17-2022 upon evaluation today patient appears to be doing decently well in regard to his wounds. He has been tolerating the dressing changes without complication. Fortunately there does not appear to be any signs of active infection at this time which is good news. 09-24-2022 upon evaluation today patient appears to be doing well currently in regard to his wounds. He has been tolerating the dressing changes without complication. Fortunately there does not appear to be any signs of active infection locally nor systemically which is great news. No fevers, chills, nausea, vomiting, or diarrhea. 10-08-2022 upon evaluation today patient appears to be doing excellent currently in regard to his wound. He has been tolerating the dressing changes without complication though it does not sound like he has been using his compression wraps for a bit here. He does think he was doing better with the [facility] topical antibiotics we can definitely go back to using that but I think the biggest issue here is that his swelling is just very out of control and needs to be under control. I discussed that with him  today. 10-15-2022 upon evaluation today patient appears to be doing well currently in regard to his wounds. He is actually making some progress which is good news. Fortunately I do not see any evidence of active infection locally nor systemically which is great news as well. No fevers, chills, nausea, vomiting, or diarrhea. He does have a callused area on the plantar aspect of his left foot which is actually causing some pain and he wonders if I can trim this down for him. 10-22-2022 upon evaluation today patient seems to be making progress. He is actually doing quite well and very pleased in that regard. I do not see any signs of active infection at this time. 11-05-2022 upon evaluation patient appears to be making progress although slowly towards closure. He seems to be doing well with regard to his legs were still using the [facility] topical antibiotics and he seems to be doing quite well. He is going require some sharp debridement today. 11-19-2022 upon evaluation today patient unfortunately appears to be extremely swollen at this point. He tells me that he ran out of supplies he also tells me his leg started leaking more because of not having supplies he was unable to wear his compression wraps that is the juxta fit compression wraps. Therefore his legs are extremely swollen much larger than normal and do not appear to be doing well at all today. He is going require some debridement I also think he is going require [place] to perform compression wrapping today. 11-26-2022 upon evaluation today patient appears to be doing well currently in regard to his legs as far as infection is concerned I see nothing that appears to be infected. Fortunately I do not see any signs of active infection systemically either which is also good news. With that being said he still is extremely swollen as far as his legs are concerned. I do not see any  signs of overall worsening but also do not see any signs of significant  improvement which is the major issue here. 12-03-2022 upon evaluation today patient appears to be doing poorly still in regard to his legs the apparently has been taken off the wraps and not leaving the week by week. With that being said he has not had really the dressings to put on the release that is running out and subsequently though he is using his wraps I am not sure that he has been keeping them on like he needs to. I explained to him I really wanted to have the wraps that I put on him left on until he comes back to see me so that we can keep the compression under better control. He voiced understanding today he tells me that he was "confused about that". Nonetheless based on what I am seeing right now also think that he is up on his feet too much I think he needs to get the feet elevated. 7/24; this is a patient with severe bilateral stage III lymphedema. On the right lower leg medially is a large area of macerated denuded skin was too small deeper areas in this. On the left leg he is 2 areas that are a little more standard in terms of wounds. Massive lymphedema bilaterally. He has compression pumps at home which he uses for 1 hour twice a day 12-17-2022 upon evaluation today patient's legs though a little bit smaller still continue to have significant issues with weeping here this just does not dry. I really think he needs to be changing this daily and this means we will probably have to try to go back to the Velcro wraps and give this a shot. I think may be going to the Velcro wraps, changing the dressing to a superabsorber like XtraSorb or the like, and have him elevate his legs and do his pumps 3 times per day is probably going to be the way to go to try to see if we can make some improvement here. 12-24-2022 upon evaluation today patient unfortunately is still struggling to get anything under control. We have been extremely aggressive and trying to: Control his swelling everything we do  however seems to be met with resistance. He tells me that he is pumping 3 times per day with his lymphedema pumps. He also tells me that he is try to elevate his legs is much as possible and that subsequently he has been keeping his compression wraps on. He goes back and forth between saying that he can keep our wraps on and not but right now I really do not see that he is doing well with the juxta fit is at least not from the standpoint of improving the overall appearance I think it does to some degree help maintain but we are still struggling here. I have voiced a concern to the patient that despite everything you are doing I am still not seeing a lot of improvement here and to be honest I am not sure what is can take get this under control he may have to go into the hospital for admission. 12-31-2022 upon evaluation today patient appears to be doing well currently in regard to his wounds. They seem a little better although he has several pustules around the legs in general but has been concerned about infection. I am actually going to go ahead and see about getting an antibiotic sent into the pharmacy today although I am going to obtain a  PCR culture as well. Zimmers, [name] [name] ([id number]) [id number] Page 11 of 15 01-07-2023 upon evaluation today patient appears to be doing well currently in regard to his wound. Has been tolerating the dressing changes without complication. He actually seems to be doing much better. He did have a PCR culture which showed Serratia as well and the prominent organisms here. Levaquin was actually a good option to treat the Serratia along with the other organisms that were problematic including E. coli. Nonetheless I think coupled with the topical Keystone antibiotics and the alginate he is really doing quite well today. 8/28; patient with severe bilateral lymphedema he has wounds bilaterally on his lower legs but they look a lot better today. We  are using silver alginate and Keystone with Urgo K2 lite wraps. He comes in today with his wounds looking quite good. He was prescribed Levaquin last time I think he will complete this in a few days. 01-21-2023 upon evaluation today patient appears to be doing worse in regard to his swelling took his wrap off he tells me he believes on Saturday. With that being said his leg is significantly swollen since that time and unfortunately does not appear to be doing nearly as good as it was last time I saw him 2 weeks ago or even last week for that matter. I am very concerned about this. 01-28-2023 upon evaluation today patient appears to be doing poorly currently in regard to his legs. He has been doing everything he says that we have recommended, he continues to use his lymphedema pumps 3 times a day, he has been doubling up on his fluid pills, he has been elevating his legs, and he tells me that he also has kept the wraps on and in fact he did have them on the day when he came in. Again that for feels the compression side of things. Nonetheless with everything going on here we still have not been able to get this completely under control. He continues to have issues with significant swelling of lower extremities and this is not limited to just his lower left portion of his legs but his thighs are fairly large as well. With that being said I really feel like that we have reached the extent of what we have to offer here at the wound care center I am wondering if we can see about making a referral for second opinion to [facility] to see if there is anything they could do to help him out at this point. It is really not much further from his home to there as it is from his home to here. Both are about an hour distance. He lives in [place]. 02-04-2023 upon evaluation today patient appears to be doing much better actually compared to last week's evaluation. This is actually significant improvement and  overall I do not know exactly what he did different other than he tells me that he "has been taking his fluid pills every day instead of every other day. Obviously this is needed. I was under the understanding previously that he was taking this every day but I think he may also not been pumping every day like he was telling me but I cannot be sure this either way he does seem to be doing what he supposed to do at this point. 02-11-2023 upon evaluation today patient unfortunately appears to be about as bad today as he appeared good last week. I am not sure what happened other than the fact he  did take his wrap off the right leg he tells me it got so tight that he could not feel his toes they are getting very numb. Subsequently he tells me that it was difficulty with driving therefore he took it off. With that being said I do believe that he may need to go to the ER for evaluation and treatment I feel he may need to be admitted to get some of the fluid off of his legs. I think that he is volume overloaded and at this point I do not think there is much working to be able to do to manage this outpatient we have tried pretty much everything that I am going to do. 02-18-23 upon evaluation today patient appears to be doing pretty well currently compared to last week this is actually better today. He is less weeping and the legs are actually significantly smaller. He tells me has been trying to stay in bed with his legs elevated more. 02-25-2023 upon evaluation today patient appears to be doing well currently in regard to his legs in general compared to where we have been. Fortunately I do not see any signs of active infection locally or systemically which is great news and in general I do believe that we are making good headway here towards complete closure. I am hopeful that he will continue to show signs of improvement as we progress going forward. He does seem to have been taking his fluid pills as well as  elevating his legs and using lymphedema pumps on a regular basis. He is trying to stay out of the hospital. 03-04-2023 upon evaluation today patient appears to be doing well currently in regard to his wound. He has been tolerating the dressing changes without complication. Fortunately there does not appear to be any signs of active infection at this time. 03-11-2023 upon evaluation today patient appears to be doing well currently in regard to his wounds. He has been tolerating the dressing changes without complication. Fortunately I do not see any signs of active infection at this time which is great news. 11/6; patient missed his clinic appointment last week. He took his wraps off at some point. Did not take his diuretics because he was nauseated last week. He comes in with massive bilateral edema which is lymphedema and probably some degree of systemic fluid overload. 11/13; patient with very poorly controlled lymphedema. We have been putting 4-layer equivalent wraps on him he says he is using his compression pumps twice a day. And elevating his legs nevertheless the edema control today is very poor. He has a substantial wound on the right medial lower leg also of the left medial and left posterior. We have been using alginate as a primary dressing or Aquacel Ag 11/20; some improvement in the amount of edema. We have been using 4-layer equivalent wraps and he is pumping with his external compression pumps twice a day. His legs had improved in terms of edema although there is still massively edematous. He tells me that he is on torsemide 1-1/2 tablets 20 mg tabs a day 12/4; massive edema extending up into his groin. Interestingly his edema in the thighs is more pitting then nonpitting lymphedema. His wounds are on both legs. This is not something that I think we can debride although I did do this last week I am not sure this did him any good. We have been using 4-layer compression he is using his  pumps twice a day. I misunderstood his torsemide dosage  which apparently is 1 tablet alternating with a half a tablet daily. I think he probably needs more than that. Unfortunately he cannot get a hold of his cardiologist... 12/11 massive edema in the bilateral lower legs extending up into his posterior thighs towards his groin. He tells me he took his compression wrap off on Sunday. He says he is using his external compression pumps twice a day. He has wounds predominantly on the right medial leg left lateral left anterior and left medial leg. The patient states he has tried to contact his cardiologist vis--vis diuretic use and he just cannot get a hold of anybody in the office 12/18; massive edema in his bilateral lower legs extending up into his groin. He has an elevated jugular venous pressure. He tells me his torsemide was increased to 30 mg he takes 130 mg tablet. He has wounds on the right medial left medial left anterior and left lateral lower legs they are covered in a nonviable surface but with this amount of edema debridement just is not indicated. He says he is using his compression pumps 3 times a day he had to cut down the wraps because they were too loose. Objective Constitutional Patient is hypertensive.. Pulse regular and within target range for patient.Marland Kitchen Respirations regular, non-labored and within target range.. Temperature is normal and within the target range for the patient.Marland Kitchen Appears in no distress. Vitals Time Taken: 3:52 PM, Height: 74 in, Weight: 250 lbs, BMI: 32.1, Temperature: 97.9 F, Pulse: 91 bpm, Respiratory Rate: 18 breaths/min, Blood Pressure: 179/65 mmHg. [name] [name] ([id number]) [id number] Page 12 of 15 Respiratory Shallow but clear. General Notes: Wound exam; right medial lower leg, left medial left posterior lower legs. All of this covered in a necrotic surface. Massive edema which is mostly nonpitting well up into his groin.  Cardiac exam suggests fluid overload with elevated jugular venous pressure, probable tricuspid regurgitation. He does not have sacral edema however no S3 Integumentary (Hair, Skin) Wound #12 status is Open. Original cause of wound was Frostbite. The date acquired was: 12/17/2022. The wound has been in treatment 20 weeks. The wound is located on the Left,Anterior Lower Leg. The wound measures 7cm length x 5.5cm width x 0.1cm depth; 30.238cm^2 area and 3.024cm^3 volume. There is Fat Layer (Subcutaneous Tissue) exposed. There is no tunneling or undermining noted. There is a large amount of serous drainage noted. The wound margin is distinct with the outline attached to the wound base. There is small (1-33%) red, pink granulation within the wound bed. There is a large (67-100%) amount of necrotic tissue within the wound bed including Adherent Slough. The periwound skin appearance exhibited: Hemosiderin Staining. The periwound skin appearance did not exhibit: Callus, Crepitus, Excoriation, Induration, Rash, Scarring, Dry/Scaly, Maceration, Atrophie Blanche, Cyanosis, Ecchymosis, Mottled, Pallor, Rubor, Erythema. Periwound temperature was noted as No Abnormality. Wound #15 status is Open. Original cause of wound was Gradually Appeared. The date acquired was: 01/14/2023. The wound has been in treatment 11 weeks. The wound is located on the Left,Distal,Posterior Lower Leg. The wound measures 2cm length x 2.3cm width x 0.1cm depth; 3.613cm^2 area and 0.361cm^3 volume. There is Fat Layer (Subcutaneous Tissue) exposed. There is no tunneling or undermining noted. There is a medium amount of serosanguineous drainage noted. The wound margin is distinct with the outline attached to the wound base. There is small (1-33%) pink granulation within the wound bed. There is a large (67-100%) amount of necrotic tissue within the wound bed including Adherent Slough. The periwound  skin appearance exhibited: Hemosiderin  Staining. The periwound skin appearance did not exhibit: Callus, Crepitus, Excoriation, Induration, Rash, Scarring, Dry/Scaly, Maceration, Atrophie Blanche, Cyanosis, Ecchymosis, Mottled, Pallor, Rubor, Erythema. Wound #16 status is Open. Original cause of wound was Blister. The date acquired was: 04/01/2023. The wound has been in treatment 5 weeks. The wound is located on the Left,Lateral Lower Leg. The wound measures 6.5cm length x 6cm width x 0.1cm depth; 30.631cm^2 area and 3.063cm^3 volume. There is Fat Layer (Subcutaneous Tissue) exposed. There is no tunneling or undermining noted. There is a medium amount of serous drainage noted. The wound margin is distinct with the outline attached to the wound base. There is small (1-33%) red granulation within the wound bed. There is a large (67-100%) amount of necrotic tissue within the wound bed including Adherent Slough. The periwound skin appearance had no abnormalities noted for texture. The periwound skin appearance had no abnormalities noted for moisture. The periwound skin appearance exhibited: Hemosiderin Staining. The periwound skin appearance did not exhibit: Atrophie Blanche, Cyanosis, Ecchymosis, Mottled, Pallor, Rubor, Erythema. Periwound temperature was noted as No Abnormality. Wound #6 status is Open. Original cause of wound was Gradually Appeared. The date acquired was: 03/05/2022. The wound has been in treatment 58 weeks. The wound is located on the Right,Medial Lower Leg. The wound measures 15cm length x 11cm width x 0.2cm depth; 129.591cm^2 area and 25.918cm^3 volume. There is Fat Layer (Subcutaneous Tissue) exposed. There is no tunneling or undermining noted. There is a large amount of serous drainage noted. The wound margin is distinct with the outline attached to the wound base. There is small (1-33%) pink granulation within the wound bed. There is a large (67-100%) amount of necrotic tissue within the wound bed including Adherent  Slough. The periwound skin appearance exhibited: Scarring, Hemosiderin Staining. The periwound skin appearance did not exhibit: Callus, Crepitus, Excoriation, Induration, Rash, Dry/Scaly, Maceration, Atrophie Blanche, Cyanosis, Ecchymosis, Mottled, Pallor, Rubor, Erythema. Periwound temperature was noted as No Abnormality. Wound #7 status is Open. Original cause of wound was Gradually Appeared. The date acquired was: 03/05/2022. The wound has been in treatment 58 weeks. The wound is located on the Left,Medial Lower Leg. The wound measures 7cm length x 16cm width x 0.2cm depth; 87.965cm^2 area and 17.593cm^3 volume. There is Fat Layer (Subcutaneous Tissue) exposed. There is no tunneling or undermining noted. There is a large amount of serous drainage noted. The wound margin is distinct with the outline attached to the wound base. There is small (1-33%) red granulation within the wound bed. There is a large (67-100%) amount of necrotic tissue within the wound bed including Adherent Slough. The periwound skin appearance exhibited: Scarring, Hemosiderin Staining. The periwound skin appearance did not exhibit: Callus, Crepitus, Excoriation, Induration, Rash, Dry/Scaly, Maceration, Atrophie Blanche, Cyanosis, Ecchymosis, Mottled, Pallor, Rubor, Erythema. Periwound temperature was noted as No Abnormality. 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 There was a Four Layer Compression Therapy Procedure by Redmond Pulling, RN. Post procedure Diagnosis Wound #12: Same as Pre-Procedure Wound #15 Pre-procedure diagnosis of Wound #15 is a Lymphedema located on the Left,Distal,Posterior Lower Leg .  There was a Four Layer Compression Therapy Procedure by Redmond Pulling, RN. Post procedure Diagnosis Wound #15: Same as Pre-Procedure Wound #16 Pre-procedure diagnosis of Wound #16 is a Venous Leg Ulcer located on the Left,Lateral Lower Leg . There was a Four Layer Compression Therapy Procedure by Redmond Pulling, RN. Post procedure Diagnosis Wound #16: Same as Pre-Procedure Wound #6 [name], Arel ([id number]) [id number] Page 13 of 15 Pre-procedure diagnosis of Wound #6 is a Lymphedema located on the Right,Medial Lower Leg . There was a Four Layer Compression Therapy Procedure by Redmond Pulling, RN. Post procedure Diagnosis Wound #6: Same as Pre-Procedure Wound #7 Pre-procedure diagnosis of Wound #7 is a Lymphedema located on the Left,Medial Lower Leg . There was a Four Layer Compression Therapy Procedure by Redmond Pulling, RN. Post procedure Diagnosis Wound #7: Same as Pre-Procedure Plan Follow-up Appointments: Return appointment in 3 weeks. [name], PA Other: - take your fluid pills as prescribed. Speak with cardiologist about increasing fluid pills T wraps off in 1 wk, dress wounds yourself, wear stockings, ake continue to pump 3x day Anesthetic: (In clinic) Topical Lidocaine 4% applied to wound bed Bathing/ Shower/ Hygiene: May shower with protection but do not get wound dressing(s) wet. Protect dressing(s) with water repellant cover (for example, large plastic bag) or a cast cover and may then take shower. Edema Control - Orders / Instructions: Segmental Compressive Device. Use the Segmental Compressive Device on leg(s) 2-3 times a day for 45 - 60 minutes. If wearing any wraps or hose, do not remove them. Continue exercising as instructed. - x3 a day Elevate legs to  the level of the heart or above for 30 minutes daily and/or when sitting for 3-4 times a day throughout the day. Avoid standing for long periods of time. Exercise regularly Off-Loading: Other: - When sitting please keep your feet up, keep legs at Heart Level or above. Use pillows behind calf to for comfort. Additional Orders / Instructions: Follow Nutritious Diet - Try and increase protein intake. Goal of protein intake is 60g-100g. Lymphedema Treatment Plan - Exercise, Compression and Elevation: Exercise daily as tolerated. (Walking, ROM, Calf Pumps and T T oe aps) Elevate legs 30 - 60 minutes at or above heart level at least 3 - 4 times daily as able/tolerated Avoid standing for long periods and elevate leg(s) parallel to the floor when sitting Use Pneumatic Compression Device on leg(s) 2-3 times a day for 45-60 minutes - x3 a day The following medication(s) was prescribed: lidocaine topical 4 % cream cream topical once daily was prescribed at facility WOUND #12: - Lower Leg Wound Laterality: Left, Anterior Cleanser: Soap and Water Every Other Day/30 Days Discharge Instructions: May shower and wash wound with dial antibacterial soap and water prior to dressing change. Peri-Wound Care: Sween Lotion (Moisturizing lotion) Every Other Day/30 Days Discharge Instructions: Apply moisturizing lotion as directed Prim Dressing: Maxorb Extra CMC/Alginate Dressing, 4x4 (in/in) (DME) (Generic) Every Other Day/30 Days ary Discharge Instructions: Apply to wound bed as instructed Secondary Dressing: ABD Pad, 8x10 Every Other Day/30 Days Discharge Instructions: Apply over primary dressing as directed. Secondary Dressing: Zetuvit Plus 4x8 in Every Other Day/30 Days Discharge Instructions: Apply over primary dressing as directed. Secondary Dressing: OptiLock Super Absorbent, 5x5.5 (in/in) (Generic) Every Other Day/30 Days Discharge Instructions: Apply directly to wound bed as directed Com pression Wrap:  Urgo K2, (equivalent to a 4 layer) two layer compression system, regular Every Other Day/30 Days Discharge Instructions: Apply Urgo K2 as directed (alternative to 4  layer compression). WOUND #15: - Lower Leg Wound Laterality: Left, Posterior, Distal Cleanser: Soap and Water Every Other Day/30 Days Discharge Instructions: May shower and wash wound with dial antibacterial soap and water prior to dressing change. Peri-Wound Care: Sween Lotion (Moisturizing lotion) Every Other Day/30 Days Discharge Instructions: Apply moisturizing lotion as directed Prim Dressing: Maxorb Extra CMC/Alginate Dressing, 4x4 (in/in) (DME) (Generic) Every Other Day/30 Days ary Discharge Instructions: Apply to wound bed as instructed Secondary Dressing: ABD Pad, 8x10 Every Other Day/30 Days Discharge Instructions: Apply over primary dressing as directed. Secondary Dressing: Zetuvit Plus 4x8 in Every Other Day/30 Days Discharge Instructions: Apply over primary dressing as directed. Secondary Dressing: OptiLock Super Absorbent, 5x5.5 (in/in) (Generic) Every Other Day/30 Days Discharge Instructions: Apply directly to wound bed as directed Com pression Wrap: Urgo K2, (equivalent to a 4 layer) two layer compression system, regular Every Other Day/30 Days Discharge Instructions: Apply Urgo K2 as directed (alternative to 4 layer compression). WOUND #16: - Lower Leg Wound Laterality: Left, Lateral Cleanser: Soap and Water Every Other Day/30 Days Discharge Instructions: May shower and wash wound with dial antibacterial soap and water prior to dressing change. Peri-Wound Care: Sween Lotion (Moisturizing lotion) Every Other Day/30 Days Discharge Instructions: Apply moisturizing lotion as directed Prim Dressing: Maxorb Extra CMC/Alginate Dressing, 4x4 (in/in) (Generic) Every Other Day/30 Days ary Discharge Instructions: Apply to wound bed as instructed Secondary Dressing: ABD Pad, 8x10 Every Other Day/30 Days Discharge  Instructions: Apply over primary dressing as directed. Secondary Dressing: Zetuvit Plus 4x8 in Every Other Day/30 Days Discharge Instructions: Apply over primary dressing as directed. Secondary Dressing: OptiLock Super Absorbent, 5x5.5 (in/in) (Generic) Every Other Day/30 Days Discharge Instructions: Apply directly to wound bed as directed Com pression Wrap: Urgo K2, (equivalent to a 4 layer) two layer compression system, regular Every Other Day/30 Days Discharge Instructions: Apply Urgo K2 as directed (alternative to 4 layer compression). WOUND #6: - Lower Leg Wound Laterality: Right, Medial Cleanser: Soap and Water Every Other Day/30 Days Discharge Instructions: May shower and wash wound with dial antibacterial soap and water prior to dressing change. Walsh, [name] [name] ([id number]) [id number] Page 14 of 15 Peri-Wound Care: Sween Lotion (Moisturizing lotion) Every Other Day/30 Days Discharge Instructions: Apply moisturizing lotion as directed Prim Dressing: Maxorb Extra CMC/Alginate Dressing, 4x4 (in/in) (DME) (Generic) Every Other Day/30 Days ary Discharge Instructions: Apply to wound bed as instructed Secondary Dressing: ABD Pad, 8x10 Every Other Day/30 Days Discharge Instructions: Apply over primary dressing as directed. Secondary Dressing: Zetuvit Plus 4x8 in Every Other Day/30 Days Discharge Instructions: Apply over primary dressing as directed. Secondary Dressing: OptiLock Super Absorbent, 5x5.5 (in/in) (Generic) Every Other Day/30 Days Discharge Instructions: Apply directly to wound bed as directed Com pression Wrap: Urgo K2, (equivalent to a 4 layer) two layer compression system, regular Every Other Day/30 Days Discharge Instructions: Apply Urgo K2 as directed (alternative to 4 layer compression). WOUND #7: - Lower Leg Wound Laterality: Left, Medial Cleanser: Soap and Water Every Other Day/30 Days Discharge Instructions: May shower and wash wound with dial  antibacterial soap and water prior to dressing change. Peri-Wound Care: Sween Lotion (Moisturizing lotion) Every Other Day/30 Days Discharge Instructions: Apply moisturizing lotion as directed Prim Dressing: Maxorb Extra CMC/Alginate Dressing, 4x4 (in/in) Every Other Day/30 Days ary Discharge Instructions: Apply to wound bed as instructed Secondary Dressing: ABD Pad, 8x10 Every Other Day/30 Days Discharge Instructions: Apply over primary dressing as directed. Secondary Dressing: Zetuvit Plus 4x8 in Every Other Day/30 Days Discharge Instructions: Apply over primary dressing  as directed. Secondary Dressing: OptiLock Super Absorbent, 5x5.5 (in/in) (Generic) Every Other Day/30 Days Discharge Instructions: Apply directly to wound bed as directed Com pression Wrap: Urgo K2, (equivalent to a 4 layer) two layer compression system, regular Every Other Day/30 Days Discharge Instructions: Apply Urgo K2 as directed (alternative to 4 layer compression). 1. Severe bilateral lymphedema which is probably lymphedema combined with some degree of systemic fluid volume overload. 2. We tried to direct him to his cardiologist and primary doctor who is Dr. Algie Coffer [place] He does not appear to be in any distress. Looking back on his last echocardiogram from earlier this year shows an ejection fraction of 45% combined systolic and diastolic dysfunction. #3 creatinine 1.75 in March of this year estimated ejection fraction of 39. This could easily be contributing to his fluid volume overload as well. 3. There is nothing I can do for these wounds with the legs in the state. I am reluctant to manage loop diuretic doses follow-up lab work etc. in this manner. We have suggested he needs a new primary doctor 4. I am going to let him use his juxta fit stockings at home and pump over the top. He will be back for follow-up in 3 weeks Electronic Signature(s) Signed: 05/06/2023 5:35:56 PM By:  [name] RN, [name] Signed: 05/07/2023 12:11:14 PM By: Baltazar Najjar MD Entered By: [name] on 05/06/2023 17:32:29 -------------------------------------------------------------------------------- SuperBill Details Patient Name: Date of Service: Stuhr, [name] [name] 05/06/2023 Medical Record Number: [id number] Patient Account Number: [id number] Date of Birth/Sex: Treating RN: 24-Jul-1945 (77 y.o. M) Primary Care Provider: Ricki [name] Other Clinician: Referring Provider: Treating Provider/Extender: Valentina Gu Weeks in Treatment: 60 Diagnosis Coding ICD-10 Codes Code Description [phone] Chronic venous hypertension (idiopathic) with ulcer and inflammation of bilateral lower extremity I89.0 Lymphedema, not elsewhere classified 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.818 Non-pressure chronic ulcer of other part of right lower leg with other specified severity I50.42 Chronic combined systolic (congestive) and diastolic (congestive) heart failure Facility Procedures : CPT4: Code [id number] Description: 81 BILATERAL: Application of multi-layer venous compression system; leg (below knee), including ankle and t. Modifier: Quantity: 1 Physician Procedures Bowlds, [name] [name] ([id number]): CPT4 Code Description [id number] - WC PHYS LEVEL 3 - EST PT ICD-10 Diagnosis Description I50.42 Chronic combined systolic (congestive) and diastolic (congestive) L97.828 Non-pressure chronic ulcer of other part of left  lower leg with o L97.818 Non-pressure chronic ulcer of other part of right lower leg with I89.0 Lymphedema, not elsewhere classified [phone].pdf Page 15 of 15: Quantity Modifier 1 heart failure ther specified severity other specified severity Electronic Signature(s) Signed: 05/06/2023 5:04:26 PM By: Redmond Pulling RN, [name] Signed: 05/07/2023 12:11:14 PM By: Baltazar Najjar  MD Entered By: Redmond Pulling on 05/06/2023 16:57:26

## 2023-05-08 NOTE — Progress Notes (Signed)
[name], [name] [id number] Page 1 of 16 Visit Report for 05/06/2023 Arrival Information Details Patient Name: Date of Service: [name], [name] 05/06/2023 3:00 PM Medical Record Number: [id number] Patient Account Number: [id number] Date of Birth/Sex: Treating RN: 24-Aug-1945 (77 y.o. [name] Primary Care [name]: [name] Other Clinician: Referring [name]: Treating [name]/Extender: [name] Weeks in Treatment: 49 Visit Information History Since Last Visit Added or deleted any medications: No Patient Arrived: [name] Any new allergies or adverse reactions: No Arrival Time: 15:32 Had a fall or experienced change in No Accompanied By: wife activities of daily living that may affect Transfer Assistance: None risk of falls: Patient Identification Verified: Yes Signs or symptoms of abuse/neglect since last visito No Secondary Verification Process Completed: Yes Hospitalized since last visit: No Patient Requires Transmission-Based Precautions: No Implantable device outside of [name] clinic excluding No Patient Has Alerts: Yes cellular tissue based products placed in [name] Patient Alerts: Patient on Blood Thinner since last visit: Right ABI in clinic [place] Has Dressing in Place as Prescribed: Yes Has Compression in Place as Prescribed: Yes Pain Present Now: Yes Electronic Signature(s) Signed: 05/06/2023 5:04:26 PM By: [name] Pulling RN, [name] Entered By: [name] on 05/06/2023 15:50:30 -------------------------------------------------------------------------------- Compression Therapy Details Patient Name: Date of Service: [name], [name] 05/06/2023 3:00 PM Medical Record Number: [id number] Patient Account Number: [id number] Date of Birth/Sex: Treating RN: Feb 01, 1946 (77 y.o. [name] Primary Care [name]: [name] Other Clinician: Referring [name]: Treating Seerat Peaden/Extender:  [name] Weeks in Treatment: 48 Compression Therapy Performed for Wound Assessment: Wound #12 Left,Anterior Lower Leg Performed By: Clinician [name] Pulling, RN Compression Type: Four Layer Post Procedure Diagnosis Same as Pre-procedure Electronic Signature(s) Signed: 05/06/2023 5:04:26 PM By: [name] Pulling RN, [name] Entered By: [name] on 05/06/2023 16:56:50 [name], [name] ([id number]) [id number] Page 2 of 16 -------------------------------------------------------------------------------- Compression Therapy Details Patient Name: Date of Service: [name], [name] 05/06/2023 3:00 PM Medical Record Number: [id number] Patient Account Number: [id number] Date of Birth/Sex: Treating RN: 09-Apr-1946 (77 y.o. [name] Primary Care [name]: [name] Other Clinician: Referring [name]: Treating [name]/Extender: [name] Weeks in Treatment: 27 Compression Therapy Performed for Wound Assessment: Wound #15 Left,Distal,Posterior Lower Leg Performed By: Clinician [name] Pulling, RN Compression Type: Four Layer Post Procedure Diagnosis Same as Pre-procedure Electronic Signature(s) Signed: 05/06/2023 5:04:26 PM By: [name] Pulling RN, [name] Entered By: [name] on 05/06/2023 16:56:50 -------------------------------------------------------------------------------- Compression Therapy Details Patient Name: Date of Service: [name], [name] 05/06/2023 3:00 PM Medical Record Number: [id number] Patient Account Number: [id number] Date of Birth/Sex: Treating RN: 24-Jun-1945 (77 y.o. [name] Primary Care [name]: [name] Other Clinician: Referring [name]: Treating [name]/Extender: [name] Weeks in Treatment: 73 Compression Therapy Performed for Wound Assessment: Wound #16 Left,Lateral Lower Leg Performed By: Clinician [name] Pulling, RN Compression  Type: Four Layer Post Procedure Diagnosis Same as Pre-procedure Electronic Signature(s) Signed: 05/06/2023 5:04:26 PM By: [name] Pulling RN, [name] Entered By: [name] on 05/06/2023 16:56:50 -------------------------------------------------------------------------------- Compression Therapy Details Patient Name: Date of Service: [name], [name] 05/06/2023 3:00 PM Medical Record Number: [id number] Patient Account Number: [id number] Date of Birth/Sex: Treating RN: September 15, 1945 (77 y.o. [name] Primary Care [name]: [name] Other Clinician: Referring [name]: Treating [name]/Extender: [name] Weeks in Treatment: 2 Compression Therapy Performed for Wound Assessment: Wound #6 Right,Medial Lower Leg Performed By: Clinician [name] Pulling, RN Compression Type: Four Layer Post  Procedure Diagnosis Same as Pre-procedure Electronic Signature(s) Signed: 05/06/2023 5:04:26 PM By: [name] Pulling RN, [name] Beezley, [name] ([id number]) PM By: [name] Pulling RN, [name] [id number].pdf Page 3 of 16 Signed: 05/06/2023 5:04:26 Entered By: [name] on 05/06/2023 16:56:50 -------------------------------------------------------------------------------- Compression Therapy Details Patient Name: Date of Service: [name], [name] 05/06/2023 3:00 PM Medical Record Number: [id number] Patient Account Number: [id number] Date of Birth/Sex: Treating RN: 1946-02-19 (77 y.o. [name] Primary Care Criselda Starke: [name] Other Clinician: Referring [name]: Treating [name]/Extender: [name] Weeks in Treatment: 69 Compression Therapy Performed for Wound Assessment: Wound #7 Left,Medial Lower Leg Performed By: Clinician [name] Pulling, RN Compression Type: Four Layer Post Procedure Diagnosis Same as Pre-procedure Electronic Signature(s) Signed: 05/06/2023 5:04:26 PM By: [name] Pulling RN, [name] Entered  By: [name] on 05/06/2023 16:56:50 -------------------------------------------------------------------------------- Encounter Discharge Information Details Patient Name: Date of Service: [name], [name] 05/06/2023 3:00 PM Medical Record Number: [id number] Patient Account Number: [id number] Date of Birth/Sex: Treating RN: 1945/09/20 (77 y.o. [name] Primary Care Kalynne Womac: [name] Other Clinician: Referring Jakyah Bradby: Treating Wrigley Plasencia/Extender: [name] Weeks in Treatment: 71 Encounter Discharge Information Items Discharge Condition: Stable Ambulatory Status: Cane Discharge Destination: Home Transportation: Private Auto Accompanied By: wife Schedule Follow-up Appointment: Yes Clinical Summary of Care: Patient Declined Electronic Signature(s) Signed: 05/06/2023 5:04:26 PM By: [name] Pulling RN, [name] Entered By: [name] on 05/06/2023 16:58:04 -------------------------------------------------------------------------------- Lower Extremity Assessment Details Patient Name: Date of Service: [name], [name] 05/06/2023 3:00 PM Medical Record Number: [id number] Patient Account Number: [id number] Date of Birth/Sex: Treating RN: [birth year] (77 y.o. [name] Primary Care Ryelynn Guedea: [name] Other Clinician: Zuidema, [name] ([id number]) [id number] Page 4 of 16 Referring Maytte [name]: Treating Andreanna Mikolajczak/Extender: Dominga Ferry, [name] Weeks in Treatment: 58 Edema Assessment Assessed: [Left: No] [Right: No] Edema: [Left: Yes] [Right: Yes] Calf Left: Right: Point of Measurement: 40 cm From Medial Instep 55.5 cm 65 cm Ankle Left: Right: Point of Measurement: 13 cm From Medial Instep 46 cm 41.5 cm Vascular Assessment Extremity colors, hair growth, and conditions: Extremity Color: [Left:Hyperpigmented] [Right:Hyperpigmented] Hair Growth on Extremity: [Left:No] Temperature of Extremity:  [Left:Warm] [Right:Warm] Capillary Refill: [Left:> 3 seconds] [Right:> 3 seconds] Dependent Rubor: [Left:No Yes] [Right:No Yes] Electronic Signature(s) Signed: 05/06/2023 5:04:26 PM By: [name] Pulling RN, [name] Entered By: [name] on 05/06/2023 15:55:57 -------------------------------------------------------------------------------- Multi Wound Chart Details Patient Name: Date of Service: [name], [name] 05/06/2023 3:00 PM Medical Record Number: [id number] Patient Account Number: [id number] Date of Birth/Sex: Treating RN: [birth year] (77 y.o. M) Primary Care Ilario Dhaliwal: [name] Other Clinician: Referring Breeann Reposa: Treating Laneah Luft/Extender: [name] Weeks in Treatment: 48 Vital Signs Height(in): 74 Pulse(bpm): 91 Weight(lbs): 250 Blood Pressure(mmHg): 179/65 Body Mass Index(BMI): 32.1 Temperature(F): 97.9 Respiratory Rate(breaths/min): 18 [12:Photos:] Left, Anterior Lower Leg Left, Distal, Posterior Lower Leg Left, Lateral Lower Leg Wound Location: Frostbite Gradually Appeared Blister Wounding Event: Lymphedema Lymphedema Venous Leg Ulcer Primary Etiology: Anemia, Lymphedema, Deep Vein Anemia, Lymphedema, Deep Vein Anemia, Lymphedema, Deep Vein Comorbid History: Thrombosis, Hypertension, Received Thrombosis, Hypertension, Received Thrombosis, Hypertension, Received Radiation Radiation Radiation 12/17/2022 01/14/2023 04/01/2023 Date Acquired: 20 11 5  Weeks of Treatment: Iott, [name] ([id number]) [id number] Page 5 of 16 Open Open Open Wound Status: No No No Wound Recurrence: No No Yes Clustered Wound: N/A N/A 3 Clustered Quantity: 7x5.5x0.1 2x2.3x0.1 6.5x6x0.1 Measurements L x W x D (cm) 30.238 3.613 30.631 A (cm) : rea 3.024 0.361 3.063 Volume (cm) : -755.60% 59.10% -455.60% %  Reduction in Area: -327.70% 59.20% -455.90% % Reduction in Volume: Full Thickness Without Exposed Full Thickness Without  Exposed Full Thickness Without Exposed Classification: Support Structures Support Structures Support Structures Large Medium Medium Exudate Amount: Serous Serosanguineous Serous Exudate Type: amber red, brown amber Exudate Color: Distinct, outline attached Distinct, outline attached Distinct, outline attached Wound Margin: Small (1-33%) Small (1-33%) Small (1-33%) Granulation Amount: Red, Pink Pink Red Granulation Quality: Large (67-100%) Large (67-100%) Large (67-100%) Necrotic Amount: Fat Layer (Subcutaneous Tissue): Yes Fat Layer (Subcutaneous Tissue): Yes Fat Layer (Subcutaneous Tissue): Yes Exposed Structures: Fascia: No Fascia: No Fascia: No Tendon: No Tendon: No Tendon: No Muscle: No Muscle: No Muscle: No Joint: No Joint: No Joint: No Bone: No Bone: No Bone: No Small (1-33%) Small (1-33%) Small (1-33%) Epithelialization: Excoriation: No Excoriation: No Excoriation: No Periwound Skin Texture: Induration: No Induration: No Induration: No Callus: No Callus: No Callus: No Crepitus: No Crepitus: No Crepitus: No Rash: No Rash: No Rash: No Scarring: No Scarring: No Scarring: No Maceration: No Maceration: No Maceration: No Periwound Skin Moisture: Dry/Scaly: No Dry/Scaly: No Dry/Scaly: No Hemosiderin Staining: Yes Hemosiderin Staining: Yes Hemosiderin Staining: Yes Periwound Skin Color: Atrophie Blanche: No Atrophie Blanche: No Atrophie Blanche: No Cyanosis: No Cyanosis: No Cyanosis: No Ecchymosis: No Ecchymosis: No Ecchymosis: No Erythema: No Erythema: No Erythema: No Mottled: No Mottled: No Mottled: No Pallor: No Pallor: No Pallor: No Rubor: No Rubor: No Rubor: No No Abnormality N/A No Abnormality Temperature: Wound Number: 6 7 N/A Photos: N/A Right, Medial Lower Leg Left, Medial Lower Leg N/A Wound Location: Gradually Appeared Gradually Appeared N/A Wounding Event: Lymphedema Lymphedema N/A Primary Etiology: Anemia,  Lymphedema, Deep Vein Anemia, Lymphedema, Deep Vein N/A Comorbid History: Thrombosis, Hypertension, Received Thrombosis, Hypertension, Received Radiation Radiation 03/05/2022 03/05/2022 N/A Date Acquired: 58 58 N/A Weeks of Treatment: Open Open N/A Wound Status: No No N/A Wound Recurrence: Yes Yes N/A Clustered Wound: 3 1 N/A Clustered Quantity: 15x11x0.2 7x16x0.2 N/A Measurements L x W x D (cm) 129.591 87.965 N/A A (cm) : rea 25.918 17.593 N/A Volume (cm) : 9.30% -86.70% N/A % Reduction in Area: -81.30% -273.40% N/A % Reduction in Volume: Full Thickness Without Exposed Full Thickness Without Exposed N/A Classification: Support Structures Support Structures Large Large N/A Exudate Amount: Serous Serous N/A Exudate Type: Media planner N/A Exudate Color: Distinct, outline attached Distinct, outline attached N/A Wound Margin: Small (1-33%) Small (1-33%) N/A Granulation Amount: Pink Red N/A Granulation Quality: Large (67-100%) Large (67-100%) N/A Necrotic Amount: Fat Layer (Subcutaneous Tissue): Yes Fat Layer (Subcutaneous Tissue): Yes N/A Exposed Structures: Fascia: No Fascia: No Tendon: No Tendon: No Muscle: No Muscle: No Joint: No Joint: No Bone: No Bone: No Small (1-33%) Small (1-33%) N/A Epithelialization: Flannery, Jarl ([id number]) [id number] Page 6 of 16 Scarring: Yes Scarring: Yes N/A Periwound Skin Texture: Excoriation: No Excoriation: No Induration: No Induration: No Callus: No Callus: No Crepitus: No Crepitus: No Rash: No Rash: No Maceration: No Maceration: No N/A Periwound Skin Moisture: Dry/Scaly: No Dry/Scaly: No Hemosiderin Staining: Yes Hemosiderin Staining: Yes N/A Periwound Skin Color: Atrophie Blanche: No Atrophie Blanche: No Cyanosis: No Cyanosis: No Ecchymosis: No Ecchymosis: No Erythema: No Erythema: No Mottled: No Mottled: No Pallor: No Pallor: No Rubor: No Rubor: No No  Abnormality No Abnormality N/A Temperature: Treatment Notes Electronic Signature(s) Signed: 05/07/2023 12:11:14 PM By: Baltazar Najjar MD Entered By: Baltazar Najjar on 05/06/2023 16:27:58 -------------------------------------------------------------------------------- Multi-Disciplinary Care Plan Details Patient Name: Date of Service: [name], [name] 05/06/2023 3:00 PM Medical Record Number: [id number] Patient Account  Number: [id number] Date of Birth/Sex: Treating RN: 1946-02-23 (77 y.o. [name] Primary Care Avangeline Stockburger: [name] Other Clinician: Referring Charlisa Cham: Treating Abram Sax/Extender: [name] Weeks in Treatment: 58 Multidisciplinary Care Plan reviewed with physician Active Inactive Wound/Skin Impairment Nursing Diagnoses: Impaired tissue integrity Knowledge deficit related to ulceration/compromised skin integrity Goals: Patient will have a decrease in wound volume by X% from date: (specify in notes) Date Initiated: 03/26/2022 Target Resolution Date: 05/19/2025 Goal Status: Active Patient/caregiver will verbalize understanding of skin care regimen Date Initiated: 03/26/2022 Date Inactivated: 03/25/2023 Target Resolution Date: 04/17/2025 Unmet Reason: patient missed last Goal Status: Unmet week. Patient Stop taking his diuertic. Ulcer/skin breakdown will have a volume reduction of 30% by week 4 Date Initiated: 03/26/2022 Date Inactivated: 05/21/2022 Target Resolution Date: 05/17/2022 Unmet Reason: see wound Goal Status: Unmet measurement. Ulcer/skin breakdown will have a volume reduction of 50% by week 8 Date Initiated: 03/26/2022 Date Inactivated: 05/21/2022 Target Resolution Date: 05/17/2022 Unmet Reason: see wound Goal Status: Unmet measurement. Interventions: Assess patient/caregiver ability to obtain necessary supplies Assess patient/caregiver ability to perform ulcer/skin care regimen upon admission and as needed Assess  ulceration(s) every visit Notes: Patient stated today, "I will take my fluid pill or pump not do both." Kamaiya Antilla made aware. Leiner, [name] ([id number]) [id number] Page 7 of 16 Electronic Signature(s) Signed: 05/06/2023 5:04:26 PM By: [name] Pulling RN, [name] Entered By: [name] on 05/06/2023 16:19:28 -------------------------------------------------------------------------------- Pain Assessment Details Patient Name: Date of Service: [name], [name] 05/06/2023 3:00 PM Medical Record Number: [id number] Patient Account Number: [id number] Date of Birth/Sex: Treating RN: Dec 03, 1945 (77 y.o. [name] Primary Care Jahleel Stroschein: [name] Other Clinician: Referring Banita Lehn: Treating Kenyata Napier/Extender: [name] Weeks in Treatment: 45 Active Problems Location of Pain Severity and Description of Pain Patient Has Paino Yes Site Locations Rate [name] pain. Current Pain Level: 8 Pain Management and Medication Current Pain Management: Electronic Signature(s) Signed: 05/06/2023 5:04:26 PM By: [name] Pulling RN, [name] Entered By: [name] on 05/06/2023 15:50:49 -------------------------------------------------------------------------------- Patient/Caregiver Education Details Patient Name: Date of Service: [name], [name] 12/18/2024andnbsp3:00 PM Medical Record Number: [id number] Patient Account Number: [id number] Date of Birth/Gender: Treating RN: 1945-11-13 (77 y.o. [name] Primary Care Physician: [name] Other Clinician: Referring Physician: Treating Physician/Extender: Stark Klein in Treatment: 76 Education Assessment Education Provided To: Patient Nicholes, [name] ([id number]) [id number] Page 8 of 16 Education Topics Provided Wound/Skin Impairment: Methods: Explain/Verbal Responses: State content correctly Electronic Signature(s) Signed:  05/06/2023 5:04:26 PM By: [name] Pulling RN, [name] Entered By: [name] on 05/06/2023 16:19:46 -------------------------------------------------------------------------------- Wound Assessment Details Patient Name: Date of Service: [name], [name] 05/06/2023 3:00 PM Medical Record Number: [id number] Patient Account Number: [id number] Date of Birth/Sex: Treating RN: September 15, 1945 (77 y.o. [name] Primary Care Salene Mohamud: [name] Other Clinician: Referring Jodie Leiner: Treating Ozell Juhasz/Extender: [name] Weeks in Treatment: 33 Wound Status Wound Number: 12 Primary Lymphedema Etiology: Wound Location: Left, Anterior Lower Leg Wound Open Wounding Event: Frostbite Status: Date Acquired: 12/17/2022 Comorbid Anemia, Lymphedema, Deep Vein Thrombosis, Hypertension, Weeks Of Treatment: 20 History: Received Radiation Clustered Wound: No Photos Wound Measurements Length: (cm) 7 Width: (cm) 5.5 Depth: (cm) 0.1 Area: (cm) 30.238 Volume: (cm) 3.024 % Reduction in Area: -755.6% % Reduction in Volume: -327.7% Epithelialization: Small (1-33%) Tunneling: No Undermining: No Wound Description Classification: Full Thickness Without Exposed Support Structures Wound Margin: Distinct, outline attached Exudate Amount: Large Exudate Type: Serous Exudate Color: amber Foul Odor  After Cleansing: No Slough/Fibrino Yes Wound Bed Granulation Amount: Small (1-33%) Exposed Structure Granulation Quality: Red, Pink Fascia Exposed: No Necrotic Amount: Large (67-100%) Fat Layer (Subcutaneous Tissue) Exposed: Yes Necrotic Quality: Adherent Slough Tendon Exposed: No Muscle Exposed: No Joint Exposed: No Bone Exposed: No Brocato, Verne ([id number]) [id number] Page 9 of 16 Periwound Skin Texture Texture Color No Abnormalities Noted: No No Abnormalities Noted: No Callus: No Atrophie Blanche: No Crepitus: No Cyanosis: No Excoriation:  No Ecchymosis: No Induration: No Erythema: No Rash: No Hemosiderin Staining: Yes Scarring: No Mottled: No Pallor: No Moisture Rubor: No No Abnormalities Noted: No Dry / Scaly: No Temperature / Pain Maceration: No Temperature: No Abnormality Treatment Notes Wound #12 (Lower Leg) Wound Laterality: Left, Anterior Cleanser Soap and Water Discharge Instruction: May shower and wash wound with dial antibacterial soap and water prior to dressing change. Peri-Wound Care Sween Lotion (Moisturizing lotion) Discharge Instruction: Apply moisturizing lotion as directed Topical Primary Dressing Maxorb Extra CMC/Alginate Dressing, 4x4 (in/in) Discharge Instruction: Apply to wound bed as instructed Secondary Dressing ABD Pad, 8x10 Discharge Instruction: Apply over primary dressing as directed. Zetuvit Plus 4x8 in Discharge Instruction: Apply over primary dressing as directed. OptiLock Super Absorbent, 5x5.5 (in/in) Discharge Instruction: Apply directly to wound bed as directed Secured With Compression Wrap Urgo K2, (equivalent to a 4 layer) two layer compression system, regular Discharge Instruction: Apply Urgo K2 as directed (alternative to 4 layer compression). Compression Stockings Add-Ons Electronic Signature(s) Signed: 05/06/2023 5:04:26 PM By: [name] Pulling RN, [name] Signed: 05/06/2023 5:28:18 PM By: Thayer Dallas Entered By: Thayer Dallas on 05/06/2023 16:04:38 -------------------------------------------------------------------------------- Wound Assessment Details Patient Name: Date of Service: [name], [name] 05/06/2023 3:00 PM Medical Record Number: [id number] Patient Account Number: [id number] Date of Birth/Sex: Treating RN: [birth year] (77 y.o. [name] Primary Care Yudit Modesitt: [name] Other Clinician: Referring Synda Bagent: Treating Youlanda Tomassetti/Extender: [name] Weeks in Treatment: 11 Wound Status Wound Number: 15 Primary  Lymphedema Etiology: Ruffner, [name] ([id number]) [id number] Page 10 of 16 Etiology: Wound Location: Left, Distal, Posterior Lower Leg Wound Open Wounding Event: Gradually Appeared Status: Date Acquired: 01/14/2023 Comorbid Anemia, Lymphedema, Deep Vein Thrombosis, Hypertension, Weeks Of Treatment: 11 History: Received Radiation Clustered Wound: No Photos Wound Measurements Length: (cm) 2 Width: (cm) 2.3 Depth: (cm) 0.1 Area: (cm) 3.613 Volume: (cm) 0.361 % Reduction in Area: 59.1% % Reduction in Volume: 59.2% Epithelialization: Small (1-33%) Tunneling: No Undermining: No Wound Description Classification: Full Thickness Without Exposed Suppor Wound Margin: Distinct, outline attached Exudate Amount: Medium Exudate Type: Serosanguineous Exudate Color: red, brown t Structures Foul Odor After Cleansing: No Slough/Fibrino Yes Wound Bed Granulation Amount: Small (1-33%) Exposed Structure Granulation Quality: Pink Fascia Exposed: No Necrotic Amount: Large (67-100%) Fat Layer (Subcutaneous Tissue) Exposed: Yes Necrotic Quality: Adherent Slough Tendon Exposed: No Muscle Exposed: No Joint Exposed: No Bone Exposed: No Periwound Skin Texture Texture Color No Abnormalities Noted: No No Abnormalities Noted: No Callus: No Atrophie Blanche: No Crepitus: No Cyanosis: No Excoriation: No Ecchymosis: No Induration: No Erythema: No Rash: No Hemosiderin Staining: Yes Scarring: No Mottled: No Pallor: No Moisture Rubor: No No Abnormalities Noted: No Dry / Scaly: No Maceration: No Treatment Notes Wound #15 (Lower Leg) Wound Laterality: Left, Posterior, Distal Cleanser Soap and Water Discharge Instruction: May shower and wash wound with dial antibacterial soap and water prior to dressing change. Peri-Wound Care Sween Lotion (Moisturizing lotion) Discharge Instruction: Apply moisturizing lotion as directed Topical Primary Dressing Maxorb Extra  CMC/Alginate Dressing, 4x4 (in/in) Discharge Instruction: Apply to wound bed  as instructed Barbier, Jashan ([id number]) [id number] Page 11 of 16 Secondary Dressing ABD Pad, 8x10 Discharge Instruction: Apply over primary dressing as directed. Zetuvit Plus 4x8 in Discharge Instruction: Apply over primary dressing as directed. OptiLock Super Absorbent, 5x5.5 (in/in) Discharge Instruction: Apply directly to wound bed as directed Secured With Compression Wrap Urgo K2, (equivalent to a 4 layer) two layer compression system, regular Discharge Instruction: Apply Urgo K2 as directed (alternative to 4 layer compression). Compression Stockings Add-Ons Electronic Signature(s) Signed: 05/06/2023 5:04:26 PM By: [name] Pulling RN, [name] Signed: 05/06/2023 5:28:18 PM By: Thayer Dallas Entered By: Thayer Dallas on 05/06/2023 16:05:16 -------------------------------------------------------------------------------- Wound Assessment Details Patient Name: Date of Service: Armentor, [name] 05/06/2023 3:00 PM Medical Record Number: [id number] Patient Account Number: [id number] Date of Birth/Sex: Treating RN: Oct 15, 1945 (77 y.o. [name] Primary Care Bria Portales: [name] Other Clinician: Referring Kynzi Levay: Treating Margit Batte/Extender: [name] Weeks in Treatment: 67 Wound Status Wound Number: 16 Primary Venous Leg Ulcer Etiology: Wound Location: Left, Lateral Lower Leg Wound Open Wounding Event: Blister Status: Date Acquired: 04/01/2023 Comorbid Anemia, Lymphedema, Deep Vein Thrombosis, Hypertension, Weeks Of Treatment: 5 History: Received Radiation Clustered Wound: Yes Photos Wound Measurements Length: (cm) Width: (cm) Depth: (cm) Clustered Quantity: Area: (cm) Volume: (cm) 6.5 % Reduction in Area: -455.6% 6 % Reduction in Volume: -455.9% 0.1 Epithelialization: Small (1-33%) 3 Tunneling: No 30.631 Undermining:  No 3.063 Wound Description Classification: Full Thickness Without Exposed Support Structures Wound Margin: Distinct, outline attached Mccumbers, Omario ([id number]) Exudate Amount: Medium Exudate Type: Serous Exudate Color: amber Foul Odor After Cleansing: No Slough/Fibrino Yes [id number] Page 12 of 16 Wound Bed Granulation Amount: Small (1-33%) Exposed Structure Granulation Quality: Red Fascia Exposed: No Necrotic Amount: Large (67-100%) Fat Layer (Subcutaneous Tissue) Exposed: Yes Necrotic Quality: Adherent Slough Tendon Exposed: No Muscle Exposed: No Joint Exposed: No Bone Exposed: No Periwound Skin Texture Texture Color No Abnormalities Noted: Yes No Abnormalities Noted: No Atrophie Blanche: No Moisture Cyanosis: No No Abnormalities Noted: Yes Ecchymosis: No Erythema: No Hemosiderin Staining: Yes Mottled: No Pallor: No Rubor: No Temperature / Pain Temperature: No Abnormality Treatment Notes Wound #16 (Lower Leg) Wound Laterality: Left, Lateral Cleanser Soap and Water Discharge Instruction: May shower and wash wound with dial antibacterial soap and water prior to dressing change. Peri-Wound Care Sween Lotion (Moisturizing lotion) Discharge Instruction: Apply moisturizing lotion as directed Topical Primary Dressing Maxorb Extra CMC/Alginate Dressing, 4x4 (in/in) Discharge Instruction: Apply to wound bed as instructed Secondary Dressing ABD Pad, 8x10 Discharge Instruction: Apply over primary dressing as directed. Zetuvit Plus 4x8 in Discharge Instruction: Apply over primary dressing as directed. OptiLock Super Absorbent, 5x5.5 (in/in) Discharge Instruction: Apply directly to wound bed as directed Secured With Compression Wrap Urgo K2, (equivalent to a 4 layer) two layer compression system, regular Discharge Instruction: Apply Urgo K2 as directed (alternative to 4 layer compression). Compression Stockings Add-Ons Electronic  Signature(s) Signed: 05/06/2023 5:04:26 PM By: [name] Pulling RN, [name] Signed: 05/06/2023 5:28:18 PM By: Thayer Dallas Entered By: Thayer Dallas on 05/06/2023 16:05:48 Zarazua, [name] ([id number]) [id number] Page 13 of 16 -------------------------------------------------------------------------------- Wound Assessment Details Patient Name: Date of Service: Vowell, [name] 05/06/2023 3:00 PM Medical Record Number: [id number] Patient Account Number: [id number] Date of Birth/Sex: Treating RN: [birth year] (77 y.o. [name] Primary Care Roen Macgowan: [name] Other Clinician: Referring Lamija Besse: Treating Enyla Lisbon/Extender: [name] Weeks in Treatment: 70 Wound Status Wound Number: 6 Primary Lymphedema Etiology: Wound Location: Right, Medial Lower Leg Wound Open Wounding Event: Gradually  Appeared Status: Date Acquired: 03/05/2022 Comorbid Anemia, Lymphedema, Deep Vein Thrombosis, Hypertension, Weeks Of Treatment: 58 History: Received Radiation Clustered Wound: Yes Photos Wound Measurements Length: (cm) Width: (cm) Depth: (cm) Clustered Quantity: Area: (cm) Volume: (cm) 15 % Reduction in Area: 9.3% 11 % Reduction in Volume: -81.3% 0.2 Epithelialization: Small (1-33%) 3 Tunneling: No 129.591 Undermining: No 25.918 Wound Description Classification: Full Thickness Without Exposed Sup Wound Margin: Distinct, outline attached Exudate Amount: Large Exudate Type: Serous Exudate Color: amber port Structures Foul Odor After Cleansing: No Slough/Fibrino Yes Wound Bed Granulation Amount: Small (1-33%) Exposed Structure Granulation Quality: Pink Fascia Exposed: No Necrotic Amount: Large (67-100%) Fat Layer (Subcutaneous Tissue) Exposed: Yes Necrotic Quality: Adherent Slough Tendon Exposed: No Muscle Exposed: No Joint Exposed: No Bone Exposed: No Periwound Skin Texture Texture Color No Abnormalities Noted: No No  Abnormalities Noted: No Callus: No Atrophie Blanche: No Crepitus: No Cyanosis: No Excoriation: No Ecchymosis: No Induration: No Erythema: No Rash: No Hemosiderin Staining: Yes Scarring: Yes Mottled: No Pallor: No Moisture Rubor: No No Abnormalities Noted: No Dry / Scaly: No Temperature / Pain Maceration: No Temperature: No Abnormality Rawlins, Levi ([id number]) [id number] Page 14 of 16 Treatment Notes Wound #6 (Lower Leg) Wound Laterality: Right, Medial Cleanser Soap and Water Discharge Instruction: May shower and wash wound with dial antibacterial soap and water prior to dressing change. Peri-Wound Care Sween Lotion (Moisturizing lotion) Discharge Instruction: Apply moisturizing lotion as directed Topical Primary Dressing Maxorb Extra CMC/Alginate Dressing, 4x4 (in/in) Discharge Instruction: Apply to wound bed as instructed Secondary Dressing ABD Pad, 8x10 Discharge Instruction: Apply over primary dressing as directed. Zetuvit Plus 4x8 in Discharge Instruction: Apply over primary dressing as directed. OptiLock Super Absorbent, 5x5.5 (in/in) Discharge Instruction: Apply directly to wound bed as directed Secured With Compression Wrap Urgo K2, (equivalent to a 4 layer) two layer compression system, regular Discharge Instruction: Apply Urgo K2 as directed (alternative to 4 layer compression). Compression Stockings Add-Ons Electronic Signature(s) Signed: 05/06/2023 5:04:26 PM By: [name] Pulling RN, [name] Signed: 05/06/2023 5:28:18 PM By: Thayer Dallas Entered By: Thayer Dallas on 05/06/2023 16:06:23 -------------------------------------------------------------------------------- Wound Assessment Details Patient Name: Date of Service: Merendino, [name] 05/06/2023 3:00 PM Medical Record Number: [id number] Patient Account Number: [id number] Date of Birth/Sex: Treating RN: 03-30-1946 (77 y.o. [name] Primary Care Kardell Virgil: [name] Other Clinician: Referring Ekansh Sherk: Treating Deontrae Drinkard/Extender: [name] Weeks in Treatment: 70 Wound Status Wound Number: 7 Primary Lymphedema Etiology: Wound Location: Left, Medial Lower Leg Wound Open Wounding Event: Gradually Appeared Status: Date Acquired: 03/05/2022 Comorbid Anemia, Lymphedema, Deep Vein Thrombosis, Hypertension, Weeks Of Treatment: 58 History: Received Radiation Clustered Wound: Yes Photos Cienfuegos, [name] ([id number]) [id number] Page 15 of 16 Wound Measurements Length: (cm) Width: (cm) Depth: (cm) Clustered Quantity: Area: (cm) Volume: (cm) 7 % Reduction in Area: -86.7% 16 % Reduction in Volume: -273.4% 0.2 Epithelialization: Small (1-33%) 1 Tunneling: No 87.965 Undermining: No 17.593 Wound Description Classification: Full Thickness Without Exposed Sup Wound Margin: Distinct, outline attached Exudate Amount: Large Exudate Type: Serous Exudate Color: amber port Structures Foul Odor After Cleansing: No Slough/Fibrino Yes Wound Bed Granulation Amount: Small (1-33%) Exposed Structure Granulation Quality: Red Fascia Exposed: No Necrotic Amount: Large (67-100%) Fat Layer (Subcutaneous Tissue) Exposed: Yes Necrotic Quality: Adherent Slough Tendon Exposed: No Muscle Exposed: No Joint Exposed: No Bone Exposed: No Periwound Skin Texture Texture Color No Abnormalities Noted: No No Abnormalities Noted: No Callus: No Atrophie Blanche: No Crepitus: No Cyanosis: No Excoriation: No Ecchymosis: No Induration: No Erythema: No  Rash: No Hemosiderin Staining: Yes Scarring: Yes Mottled: No Pallor: No Moisture Rubor: No No Abnormalities Noted: No Dry / Scaly: No Temperature / Pain Maceration: No Temperature: No Abnormality Treatment Notes Wound #7 (Lower Leg) Wound Laterality: Left, Medial Cleanser Soap and Water Discharge Instruction: May shower and wash wound with dial  antibacterial soap and water prior to dressing change. Peri-Wound Care Sween Lotion (Moisturizing lotion) Discharge Instruction: Apply moisturizing lotion as directed Topical Primary Dressing Maxorb Extra CMC/Alginate Dressing, 4x4 (in/in) Discharge Instruction: Apply to wound bed as instructed Secondary Dressing ABD Pad, 8x10 Discharge Instruction: Apply over primary dressing as directed. Zetuvit Plus 4x8 in Discharge Instruction: Apply over primary dressing as directed. Bollen, [name] ([id number]) [id number] Page 16 of 16 OptiLock Super Absorbent, 5x5.5 (in/in) Discharge Instruction: Apply directly to wound bed as directed Secured With Compression Wrap Urgo K2, (equivalent to a 4 layer) two layer compression system, regular Discharge Instruction: Apply Urgo K2 as directed (alternative to 4 layer compression). Compression Stockings Add-Ons Electronic Signature(s) Signed: 05/06/2023 5:04:26 PM By: [name] Pulling RN, [name] Signed: 05/06/2023 5:28:18 PM By: Thayer Dallas Entered By: Thayer Dallas on 05/06/2023 16:06:59 -------------------------------------------------------------------------------- Vitals Details Patient Name: Date of Service: Shammas, [name] 05/06/2023 3:00 PM Medical Record Number: [id number] Patient Account Number: [id number] Date of Birth/Sex: Treating RN: Sep 24, 1945 (77 y.o. [name] Primary Care Dasia Guerrier: [name] Other Clinician: Referring Hiram Mciver: Treating Anjoli Diemer/Extender: [name] Weeks in Treatment: 34 Vital Signs Time Taken: 15:52 Temperature (F): 97.9 Height (in): 74 Pulse (bpm): 91 Weight (lbs): 250 Respiratory Rate (breaths/min): 18 Body Mass Index (BMI): 32.1 Blood Pressure (mmHg): 179/65 Reference Range: 80 - 120 mg / dl Electronic Signature(s) Signed: 05/06/2023 5:04:26 PM By: [name] Pulling RN, [name] Entered By: [name] on 05/06/2023 15:53:33

## 2023-05-27 ENCOUNTER — Encounter (HOSPITAL_BASED_OUTPATIENT_CLINIC_OR_DEPARTMENT_OTHER): Payer: Medicare Other | Attending: Physician Assistant | Admitting: Internal Medicine

## 2023-05-27 DIAGNOSIS — I5042 Chronic combined systolic (congestive) and diastolic (congestive) heart failure: Secondary | ICD-10-CM | POA: Diagnosis not present

## 2023-05-27 DIAGNOSIS — N1832 Chronic kidney disease, stage 3b: Secondary | ICD-10-CM | POA: Insufficient documentation

## 2023-05-27 DIAGNOSIS — I130 Hypertensive heart and chronic kidney disease with heart failure and stage 1 through stage 4 chronic kidney disease, or unspecified chronic kidney disease: Secondary | ICD-10-CM | POA: Diagnosis not present

## 2023-05-27 DIAGNOSIS — I890 Lymphedema, not elsewhere classified: Secondary | ICD-10-CM | POA: Insufficient documentation

## 2023-05-27 DIAGNOSIS — L97818 Non-pressure chronic ulcer of other part of right lower leg with other specified severity: Secondary | ICD-10-CM | POA: Diagnosis not present

## 2023-05-27 DIAGNOSIS — Z86718 Personal history of other venous thrombosis and embolism: Secondary | ICD-10-CM | POA: Diagnosis not present

## 2023-05-27 DIAGNOSIS — L97828 Non-pressure chronic ulcer of other part of left lower leg with other specified severity: Secondary | ICD-10-CM | POA: Insufficient documentation

## 2023-05-27 DIAGNOSIS — Z8546 Personal history of malignant neoplasm of prostate: Secondary | ICD-10-CM | POA: Diagnosis not present

## 2023-05-27 DIAGNOSIS — I87333 Chronic venous hypertension (idiopathic) with ulcer and inflammation of bilateral lower extremity: Secondary | ICD-10-CM | POA: Diagnosis present

## 2023-05-29 NOTE — Progress Notes (Signed)
 Scheper, Baudelio ([id number]) [id number] Page 1 of 15 Visit Report for 05/27/2023 Chief Complaint Document Details Patient Name: Date of Service: [place], [name] 05/27/2023 2:45 PM Medical Record Number: [id number] Patient Account Number: [id number] Date of Birth/Sex: Treating RN: 05-17-1946 (78 y.o. M) Primary Care Provider: Claudene Salena RAMAN Other Clinician: Referring Provider: Treating Provider/Extender: Rosan Harlene Claudene Salena RAMAN Weeks in Treatment: 68 Information Obtained from: Patient Chief Complaint 02/23/2020; patient is here for wounds on his bilateral lower legs in the setting of severe lymphedema 03/26/2022; patient is here for wounds on his bilateral lower legs in the setting of severe lymphedema Electronic Signature(s) Signed: 05/29/2023 11:00:26 AM By: Rosan Harlene DO Entered By: Rosan Harlene on 05/29/2023 07:19:30 -------------------------------------------------------------------------------- HPI Details Patient Name: Date of Service: [name] 05/27/2023 2:45 PM Medical Record Number: [id number] Patient Account Number: [id number] Date of Birth/Sex: Treating RN: 12/19/1945 (78 y.o. M) Primary Care Provider: Claudene Salena RAMAN Other Clinician: Referring Provider: Treating Provider/Extender: Rosan Harlene Claudene Salena RAMAN Weeks in Treatment: 62 History of Present Illness HPI Description: ADMISSION 02/23/2020 Patient is a 78 year old man who lives in [place] who arrives accompanied by his wife. He has a history of chronic lymphedema and venous insufficiency in his bilateral lower legs which may have something to do that with having a history of DVT as well as being treated for prostate cancer. In any case he recently got compression pumps at home but compliance has been an issue here. He has compression stockings however they are probably not sufficient enough to control swelling. They tell us  that things deteriorated for him in  late August he was admitted to [facility] for 7 days. This was with cellulitis I think of his bilateral lower legs. Discharge he was noted to have wounds on his bilateral lower legs. He was discharged on Bactrim. They tried to get him home health through [employer] Medicare part C of course they declined him. His wife is been wrapping these applying some form of silver foam dressing. He has a history of wounds before although nothing that would not heal with basic home topical dressings. He has 2 areas on the left medial, left anterior and left lateral and a smaller area on the right medial. All of these have considerable depth. Past medical history includes iron deficiency anemia, lymphedema followed by the rehab center at [facility] with lymphedema wraps I believe, DVT on chronic anticoagulation, prostate cancer, chronic venous insufficiency, hypertension. As mentioned he has compression pumps but does not use them. ABIs in our clinic were noncompressible bilaterally 10/14; patient with severe bilateral lymphedema right greater than left. He came in with bilateral lower extremity wounds left greater than right. Even though the right side has more of the edema most of the wounds here almost closed on the right medial. He has 3 remaining wounds on the left We have been using silver alginate under 4-layer compression I have been trying to get him to be compliant with his external compression pumps 10/21; patient with 3 small wounds on the left leg and 1 on the right medial in the setting of severe lymphedema and chronic venous insufficiency. We have been using silver alginate under 4-layer compression he is using his external compression pumps twice a day 11/4; ARTERIAL STUDIES on the right show an ABI of 1.02 TBI of 0.858 with biphasic waveforms on the left 0.98 with a TBI of 0.55 and biphasic waveforms. Does not look like he has significant arterial disease. We are treating  him for lymphedema  he has compression pumps. He has punched-out areas on the left anterior left lateral and right medial lower extremities 11/11; after we obtained his arterial studies I put him in 4 layer compression. He is using his compression pumps probably once a day although I have asked Mccullars, SALOMON ([id number]) [phone].pdf Page 2 of 15 him to do twice. Primary dressing to the wound is silver collagen he has severe lymphedema likely secondary to chronic venous insufficiency. Wounds on the left lateral, left medial and left anterior and a small area on the right medial 12/2; the area on the right anterior lower leg has healed. We initially thought that the area medially had healed as well however when her discharge nurse came in she detected fluid in the wound simply opened up. This is actually worse than I remember this pain. The area on the left lateral potentially slightly smaller He is also complaining about pain in his left hand he says that this is actually been getting some better he has been using topical creams on this. She asked that I look at this 12/9 after last weeks issues we have 2 wounds one on the right medial lower leg and 1 on the left lateral. Both of these are in the same condition. I think because of thickened skin secondary to chronic lymphedema these wounds actually have depth of almost 0.8 cm. 12/16; the patient has 2 small but deep wounds one on the right medial and one on the left lateral. The right medial is actually the worst of these. He arrives in clinic today with absolutely terrible edema in the right leg apparently his 4-layer wrap fell down to just above his ankle he did not think about this he is apparently been continuing to use his compression pump twice a day. The left leg looks a lot better. 05/09/2020 upon evaluation today patient appears to be doing decently well in regard to his wounds. Everything is measuring smaller the right leg still has  a little bit deeper wound in the left seems to be almost completely healed in my opinion I am very pleased in general with how things are progressing. He has a 4- layer compression wrap we have been using endoform today we will probably have to use collagen just based on the fact that we do not have endoform it is on order. 1/6; the patient's wound on the left lateral lower leg has healed. Still has 1 on the right medial. 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 Using compression pumps at home twice a day. 1/13; left lateral lower leg is still healed. He has a deep punched out rectangular shaped wound on the right medial calf. Looking down at this it appears that he is attempting to epithelialize around the edges of the wound and on the base as well. His edema is reasonably well controlled we have been using collagen with absolutely no effect 1/20; left lateral lower leg remains closed he has extremitease stockings. The area on the right medial calf I aggressively debrided last week measures larger but the surface looks better. We have been using Hydrofera Blue. We ran Oasis through his insurance but we have not seen the results of this 1/27; left lower leg wound with chronic venous insufficiency and secondary lymphedema. I did aggressive debridement on this last week the wound seems to have come in healthy looking surface using Hydrofera Blue. He was denied for Oasis 2/3; small divot in the right medial  lower leg. Under illumination the walls of this divot are epithelialized however the base has slough which I removed with a curette we have been using Hydrofera Blue 2/10 small divot on the right medial lower leg pinpoint illumination at the base of this cone-shaped wound. We have been using Hydrofera Blue but I will switch to calcium alginate this week 2/17; the small divot on the right medial lower leg is fully epithelialized. There is no visible open area under illumination.  He has his own stocking for the right leg similar to the one he has been wearing on the left. 03/26/2022; READMISSION This is a now 78 year old man that we had in the clinic from 02/23/2020 through 07/05/2020. At that point he had bilateral lower extremity wounds left greater than right in the setting of severe lymphedema. He had already obtained compression pumps ordered for him I think from the wound care clinic in [place]  so I do not really have record of what he has been using. He claims to be using them once a day but there is a problem with the sleeve on the left leg. About 2 weeks ago he was hospitalized from 03/11/2022 through 03/14/2022 with diastolic congestive heart failure. 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 He was diuresed. Developed some prerenal azotemia and he has not been taking any diuretics currently. He has not been putting stockings on his legs since he got out of hospital and still has his legs dependent for long periods. Past medical history history of prostate cancer treated with prostatectomy and radiation this was apparently about 8 years ago, history of DVT on chronic Coumadin , history of lymphedema was managed for a while at the clinic in [place]. History of inguinal hernia repair in September 22, hypertension, stage IIIb chronic renal failure ABIs today were noncompressible on the right 1.12 on the left 04-02-2022 upon evaluation today patient appears to be doing well currently in regard to his legs I do feel like both areas that are draining are actually much drier than they were in the picture last week although the left is drier than the right. He is tolerating the 4-layer compression wraps at this point he did contact the pump company and they are actually working on getting him a new compression sleeve for one of his legs which have previously popped and was not functioning properly. 04-23-2022 upon  evaluation today patient appears to be doing well currently in regard to his wounds on the legs. I am actually very pleased with where things stand and I do feel like that we are headed in the right direction. Fortunately there is no sign of active infection locally or systemically at this time. 05-07-2022 upon evaluation today patient appears to be doing well currently in regard to his wounds in fact things are showing signs of improvement which is good news I do not see too much that actually appears to be open and I am very pleased in that regard. No fevers, chills, nausea, vomiting, or diarrhea. 05-21-2022 upon evaluation today patient appears to be doing somewhat poorly in regard to drainage of his lower extremities bilaterally. The right is greater than left as far as the weeping area. Nonetheless it seems to be getting worse not better. He actually has pitting edema which is at least 2+ to the thighs and I am concerned about the fact that he is may be fluid overloaded in general and that is the  reason why we cannot get this under control. I know he is not using his pumps all the time because he actually told the nurse that he was either going to pump or he was going to use his fluid pills but not do both. For that reason I do think that he needs to be really doing both in order to get the fluid out as effectively as possible obviously with the 4-layer compression wraps were doing as much as we can from a compression standpoint but it is really not enough. He tells me that he elevates his leg is much as he can in between pumping and other activity throughout the day. 05-28-2022 upon evaluation today patient appears to be doing better in regard to his wounds although the measurements may be a little bit larger this is a very difficult wound to heal it is very indistinct in a lot of areas. Nonetheless there is can be some need for sharp debridement in regard to both medial and lateral legs. Fortunately  I see no signs of active infection locally nor systemically at this time. No fevers, chills, nausea, vomiting, or diarrhea. 06-04-2022 upon evaluation today patient appears to be doing poorly in general in regard to the wounds on his legs. He still continues to have a tremendous amount of fluid not just in the lower portion of his leg but to be honest his thigh where he has 2-3+ pitting edema in the thigh as well. Unfortunately I do not know that we will be able to get this healed effectively and keep it healed on the lower extremities unless he gets the overall fluid situation taking and under control. Fortunately I do not see any signs of infection locally nor systemically which is great news. He just seems to be very fluid overloaded. 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 He has been tolerating the dressing changes with the compression wraps without complication. Fortunately I do not see any evidence of infection at this time which is great news. No fevers, chills, nausea, vomiting, or diarrhea. 06-18-2022 upon evaluation today patient appears to be doing well currently in regard to his wounds as far as not looking like they are terribly infected but nonetheless I am concerned about a subacute infection secondary to the fact that he continues to have spreading despite the compression therapy. We actually did do an Unna boot on him last week this is actually the first wrap that actually stayed up everything else has been sliding down quite significantly. Fortunately there does not appear to be any signs of infection systemically at this time. With that being said I do believe that locally there seems to be an issue going on here and again I Dimas do a PCR culture to see what that shows also think that I am going to put him on a broad-spectrum antibiotic, doxycycline to see how that  will help as well. He does tell me that coming into the clinic today that he was feeling short of breath like he was about to have a heart attack because he was having such a hard time breathing. He says that he told this to Dr. Graeme his cardiologist as well when he was evaluated in the [place], [facility] ([id number]) [phone].pdf Page 3 of 15 past 1 to 2 weeks. 07-02-2022 upon evaluation today patient appears to be doing poorly currently in regard to his wound. He has been tolerating the  dressing changes. Unfortunately he has not had any compression wraps on for the past week because he was unable to make it in for his appointment last week. With that being said he has a significant amount of drainage he tells me has been using his pumps but despite this in the pumps he still has been draining quite a bit. The drainage is also somewhat purulent unfortunately. We did attempt to get in touch with his cardiologist last week unfortunately we were unable to get up with him I did advise that the patient needs to get in touch with him upon leaving today in order to make sure they know he is on the new antibiotics I am going to send him this will be Levaquin and Augmentin. 07-09-2022 upon evaluation today patient appears to be doing about the same in regard to his legs he may have just a slight amount of improvement with regard to the drainage probably Keystone topical antibiotics are helping in this regard to some degree. Fortunately there does not appear to be any signs of active infection systemically which is great news. No fevers, chills, nausea, vomiting, or diarrhea. 07-16-2022 upon evaluation today patient appears to be doing well currently in regard to his wound. He has been tolerating the dressing changes without complication. Fortunately there does not appear to be any signs of active infection locally nor systemically at this time. With that being said he cannot keep the  wraps up he tells me on the left side he had to cut this down because it got too tight. He has been using his pumps but he is on the right side the wrap actually straight down causing some pushing around the central part of his leg just below the calf I think this is a bigger risk for him that help at this point. I think that we may need to try something different he should be getting his compression socks shortly he tells me they were ordered last Thursday. 3/6; ; this is a patient who lives in [place]. He has severe bilateral lymphedema. He has compression pumps, we have been using kerlix Ace wrap Keystone. He is changing the dressing. We do not have home health. 08-06-2022 upon evaluation today patient appears to be doing a little better in regard to his wounds in general at this point. Fortunately there does not appear to be any signs of active infection locally nor systemically at this time which is great news and overall I am extremely pleased with where we stand today. 08-13-2022 upon evaluation today patient appears to actually be doing significantly better compared to last week. He actually did go to the hospital I told him that he needed to when he left here and he actually did go. With that being said they actually ended up admitting him he was having shortness of breath and I thought it might be related to congestive heart failure turns out he actually had a pulmonary embolism. Subsequently they were able to get him off of the Coumadin  switching over to Eliquis  to get things stabilized in that regard they also had them wrapped and got his swelling under control on his legs he actually looks much better pretty much across the board at this point. I am very pleased in that regard. With that being said I am very happy that he finally went that could have been a very dangerous situation. 08-20-2022 upon evaluation today patient appears to be doing well currently in regard to his wound.  Has been  tolerating the dressing changes without complication. Fortunately there does not appear to be any signs of active infection locally nor systemically at this time. I think his legs are doing better there is some need for sharp debridement today. 08-27-2022 upon evaluation patient is actually making excellent progress. I am actually very pleased with where he stands and I think that he is moving in the right direction. In general I think that we are looking pretty good at the moment. 09-03-2022 upon evaluation today patient appears to be doing well currently in regard to his wound. He is actually tolerating dressing changes on the left and right leg without complication. Fortunately I do not see any need for debridement of the left leg the right leg I think we probably do some need to perform some debridement here. 09-10-2022 upon evaluation today patient's wounds actually showed signs of improvement in both legs I do not see much is going require debridement today which was great news. Fortunately I do not see any evidence of infection which I think is also excellent he seems to be using his pumps and doing everything right I am happy about how this is progressing at this point. 09-17-2022 upon evaluation today patient appears to be doing decently well in regard to his wounds. He has been tolerating the dressing changes without complication. Fortunately there does not appear to be any signs of active infection at this time which is good news. 09-24-2022 upon evaluation today patient appears to be doing well currently in regard to his wounds. He has been tolerating the dressing changes without complication. Fortunately there does not appear to be any signs of active infection locally nor systemically which is great news. No fevers, chills, nausea, vomiting, or diarrhea. 10-08-2022 upon evaluation today patient appears to be doing excellent currently in regard to his wound. He has been tolerating the dressing  changes without complication though it does not sound like he has been using his compression wraps for a bit here. He does think he was doing better with the [facility] topical antibiotics we can definitely go back to using that but I think the biggest issue here is that his swelling is just very out of control and needs to be under control. I discussed that with him today. 10-15-2022 upon evaluation today patient appears to be doing well currently in regard to his wounds. He is actually making some progress which is good news. Fortunately I do not see any evidence of active infection locally nor systemically which is great news as well. No fevers, chills, nausea, vomiting, or diarrhea. He does have a callused area on the plantar aspect of his left foot which is actually causing some pain and he wonders if I can trim this down for him. 10-22-2022 upon evaluation today patient seems to be making progress. He is actually doing quite well and very pleased in that regard. I do not see any signs of active infection at this time. 11-05-2022 upon evaluation patient appears to be making progress although slowly towards closure. He seems to be doing well with regard to his legs were still using the [facility] topical antibiotics and he seems to be doing quite well. He is going require some sharp debridement today. 11-19-2022 upon evaluation today patient unfortunately appears to be extremely swollen at this point. He tells me that he ran out of supplies he also tells me his leg started leaking more because of not having supplies he was unable to wear  his compression wraps that is the juxta fit compression wraps. Therefore his legs are extremely swollen much larger than normal and do not appear to be doing well at all today. He is going require some debridement I also think he is going require us  to perform compression wrapping today. 11-26-2022 upon evaluation today patient appears to be doing well currently in regard to  his legs as far as infection is concerned I see nothing that appears to be infected. Fortunately I do not see any signs of active infection systemically either which is also good news. With that being said he still is extremely swollen as far as his legs are concerned. I do not see any signs of overall worsening but also do not see any signs of significant improvement which is the major issue here. 12-03-2022 upon evaluation today patient appears to be doing poorly still in regard to his legs the apparently has been taken off the wraps and not leaving the week by week. With that being said he has not had really the dressings to put on the release that is running out and subsequently though he is using his wraps I am not sure that he has been keeping them on like he needs to. I explained to him I really wanted to have the wraps that I put on him left on until he comes back to see me so that we can keep the compression under better control. He voiced understanding today he tells me that he was confused about that. Nonetheless based on what I am seeing right now also think that he is up on his feet too much I think he needs to get the feet elevated. 7/24; this is a patient with severe bilateral stage III lymphedema. On the right lower leg medially is a large area of macerated denuded skin was too small deeper areas in this. On the left leg he is 2 areas that are a little more standard in terms of wounds. Massive lymphedema bilaterally. He has compression pumps at home which he uses for 1 hour twice a day 12-17-2022 upon evaluation today patient's legs though a little bit smaller still continue to have significant issues with weeping here this just does not dry. I really think he needs to be changing this daily and this means we will probably have to try to go back to the Velcro wraps and give this a shot. I think may be going to the Velcro wraps, changing the dressing to a superabsorber like XtraSorb or  the like, and have him elevate his legs and do his pumps 3 times per day is Holliman, Arlie ([id number]) [phone].pdf Page 4 of 15 probably going to be the way to go to try to see if we can make some improvement here. 12-24-2022 upon evaluation today patient unfortunately is still struggling to get anything under control. We have been extremely aggressive and trying to: Control his swelling everything we do however seems to be met with resistance. He tells me that he is pumping 3 times per day with his lymphedema pumps. He also tells me that he is try to elevate his legs is much as possible and that subsequently he has been keeping his compression wraps on. He goes back and forth between saying that he can keep our wraps on and not but right now I really do not see that he is doing well with the juxta fit is at least not from the standpoint of improving the overall appearance I  think it does to some degree help maintain but we are still struggling here. I have voiced a concern to the patient that despite everything you are doing I am still not seeing a lot of improvement here and to be honest I am not sure what is can take get this under control he may have to go into the hospital for admission. 12-31-2022 upon evaluation today patient appears to be doing well currently in regard to his wounds. They seem a little better although he has several pustules around the legs in general but has been concerned about infection. I am actually going to go ahead and see about getting an antibiotic sent into the pharmacy today although I am going to obtain a PCR culture as well. 01-07-2023 upon evaluation today patient appears to be doing well currently in regard to his wound. Has been tolerating the dressing changes without complication. He actually seems to be doing much better. He did have a PCR culture which showed Serratia as well and the prominent organisms here. Levaquin was actually a  good option to treat the Serratia along with the other organisms that were problematic including E. coli. Nonetheless I think coupled with the topical Keystone antibiotics and the alginate he is really doing quite well today. 8/28; patient with severe bilateral lymphedema he has wounds bilaterally on his lower legs but they look a lot better today. We are using silver alginate and Keystone with Urgo K2 lite wraps. He comes in today with his wounds looking quite good. He was prescribed Levaquin last time I think he will complete this in a few days. 01-21-2023 upon evaluation today patient appears to be doing worse in regard to his swelling took his wrap off he tells me he believes on Saturday. With that being said his leg is significantly swollen since that time and unfortunately does not appear to be doing nearly as good as it was last time I saw him 2 weeks ago or even last week for that matter. I am very concerned about this. 01-28-2023 upon evaluation today patient appears to be doing poorly currently in regard to his legs. He has been doing everything he says that we have recommended, he continues to use his lymphedema pumps 3 times a day, he has been doubling up on his fluid pills, he has been elevating his legs, and he tells me that he also has kept the wraps on and in fact he did have them on the day when he came in. Again that for feels the compression side of things. Nonetheless with everything going on here we still have not been able to get this completely under control. He continues to have issues with significant swelling of lower extremities and this is not limited to just his lower left portion of his legs but his thighs are fairly large as well. With that being said I really feel like that we have reached the extent of what we have to offer here at the wound care center I am wondering if we can see about making a referral for second opinion to [facility] to see if there is  anything they could do to help him out at this point. It is really not much further from his home to there as it is from his home to here. Both are about an hour distance. He lives in [place] . 02-04-2023 upon evaluation today patient appears to be doing much better actually compared to last week's evaluation. This is actually  significant improvement and overall I do not know exactly what he did different other than he tells me that he has been taking his fluid pills every day instead of every other day. Obviously this is needed. I was under the understanding previously that he was taking this every day but I think he may also not been pumping every day like he was telling me but I cannot be sure this either way he does seem to be doing what he supposed to do at this point. 02-11-2023 upon evaluation today patient unfortunately appears to be about as bad today as he appeared good last week. I am not sure what happened other than the fact he did take his wrap off the right leg he tells me it got so tight that he could not feel his toes they are getting very numb. Subsequently he tells me that it was difficulty with driving therefore he took it off. With that being said I do believe that he may need to go to the ER for evaluation and treatment I feel he may need to be admitted to get some of the fluid off of his legs. I think that he is volume overloaded and at this point I do not think there is much working to be able to do to manage this outpatient we have tried pretty much everything that I am going to do. 02-18-23 upon evaluation today patient appears to be doing pretty well currently compared to last week this is actually better today. He is less weeping and the legs are actually significantly smaller. He tells me has been trying to stay in bed with his legs elevated more. 02-25-2023 upon evaluation today patient appears to be doing well currently in regard to his legs in general compared to where we  have been. Fortunately I do not see any signs of active infection locally or systemically which is great news and in general I do believe that we are making good headway here towards complete closure. I am hopeful that he will continue to show signs of improvement as we progress going forward. He does seem to have been taking his fluid pills as well as elevating his legs and using lymphedema pumps on a regular basis. He is trying to stay out of the hospital. 03-04-2023 upon evaluation today patient appears to be doing well currently in regard to his wound. He has been tolerating the dressing changes without complication. Fortunately there does not appear to be any signs of active infection at this time. 03-11-2023 upon evaluation today patient appears to be doing well currently in regard to his wounds. He has been tolerating the dressing changes without complication. Fortunately I do not see any signs of active infection at this time which is great news. 11/6; patient missed his clinic appointment last week. He took his wraps off at some point. Did not take his diuretics because he was nauseated last week. He comes in with massive bilateral edema which is lymphedema and probably some degree of systemic fluid overload. 11/13; patient with very poorly controlled lymphedema. We have been putting 4-layer equivalent wraps on him he says he is using his compression pumps twice a day. And elevating his legs nevertheless the edema control today is very poor. He has a substantial wound on the right medial lower leg also of the left medial and left posterior. We have been using alginate as a primary dressing or Aquacel Ag 11/20; some improvement in the amount of edema. We have  been using 4-layer equivalent wraps and he is pumping with his external compression pumps twice a day. His legs had improved in terms of edema although there is still massively edematous. He tells me that he is on torsemide  1-1/2 tablets  20 mg tabs a day 12/4; massive edema extending up into his groin. Interestingly his edema in the thighs is more pitting then nonpitting lymphedema. His wounds are on both legs. This is not something that I think we can debride although I did do this last week I am not sure this did him any good. We have been using 4-layer compression he is using his pumps twice a day. I misunderstood his torsemide  dosage which apparently is 1 tablet alternating with a half a tablet daily. I think he probably needs more than that. Unfortunately he cannot get a hold of his cardiologist... 12/11 massive edema in the bilateral lower legs extending up into his posterior thighs towards his groin. He tells me he took his compression wrap off on Sunday. He says he is using his external compression pumps twice a day. He has wounds predominantly on the right medial leg left lateral left anterior and left medial leg. The patient states he has tried to contact his cardiologist vis--vis diuretic use and he just cannot get a hold of anybody in the office 12/18; massive edema in his bilateral lower legs extending up into his groin. He has an elevated jugular venous pressure. He tells me his torsemide  was increased to 30 mg he takes 130 mg tablet. He has wounds on the right medial left medial left anterior and left lateral lower legs they are covered in a nonviable surface but with this amount of edema debridement just is not indicated. He says he is using his compression pumps 3 times a day he had to cut down the wraps because they were too loose. 05/27/2023; patient presents for follow-up. He has not been here in 3 weeks. He states he is using his lymphedema pumps. He is using silver alginate to the wound beds under his Velcro compression wraps. Channing, [name] ([id number]) [id number] Page 5 of 15 Electronic Signature(s) Signed: 05/29/2023 11:00:26 AM By: Rosan Raisin DO Entered By: Rosan Raisin  on 05/29/2023 07:23:45 -------------------------------------------------------------------------------- Physical Exam Details Patient Name: Date of Service: Sawyers, [name] 05/27/2023 2:45 PM Medical Record Number: [id number] Patient Account Number: [id number] Date of Birth/Sex: Treating RN: 07-02-1945 (78 y.o. M) Primary Care Provider: Claudene Salena RAMAN Other Clinician: Referring Provider: Treating Provider/Extender: Rosan Raisin Claudene Salena RAMAN Weeks in Treatment: 58 Constitutional respirations regular, non-labored and within target range for patient.. Cardiovascular 2+ dorsalis pedis/posterior tibialis pulses. Psychiatric pleasant and cooperative. Notes Scattered open wounds to the lower extremities bilaterally with non viable tissue. Stage IV lymphedema. No obvious signs of overt soft tissue infection. Electronic Signature(s) Signed: 05/29/2023 11:00:26 AM By: Rosan Raisin DO Entered By: Rosan Raisin on 05/29/2023 07:35:41 -------------------------------------------------------------------------------- Physician Orders Details Patient Name: Date of Service: Iacobucci, [name] 05/27/2023 2:45 PM Medical Record Number: [id number] Patient Account Number: [id number] Date of Birth/Sex: Treating RN: 09/02/1945 (78 y.o. M) Primary Care Provider: Claudene Salena RAMAN Other Clinician: Referring Provider: Treating Provider/Extender: Rosan Raisin Claudene Salena RAMAN Weeks in Treatment: 98 Verbal / Phone Orders: No Diagnosis Coding Follow-up Appointments ppointment in 1 week. - +++ Extra Time++++Dr. Rosan Tuesday 06/02/23 at 2:45pm (room 9) Return A +++EXTRA TIME+++++ Other: - take your fluid pills as prescribed. Speak with cardiologist about increasing fluid pills T wraps off in 1 wk, dress  wounds yourself, wear stockings, continue to pump 3x day ake Anesthetic (In clinic) Topical Lidocaine  4% applied to wound bed Bathing/ Shower/ Hygiene May shower with protection but do not  get wound dressing(s) wet. Protect dressing(s) with water repellant cover (for example, large plastic bag) or a cast cover and may then take shower. Edema Control - Orders / Instructions Segmental Compressive Device. Use the Segmental Compressive Device on leg(s) 2-3 times a day for 45 - 60 minutes. If wearing any wraps or hose, do not remove them. Continue exercising as instructed. - x3 a day Elevate legs to the level of the heart or above for 30 minutes daily and/or when sitting for 3-4 times a day throughout the day. Avoid standing for long periods of time. Berwick, Latavious ([id number]) [id number] Page 6 of 15 Exercise regularly Off-Loading Other: - When sitting please keep your feet up, keep legs at Heart Level or above. Use pillows behind calf to for comfort. Additional Orders / Instructions Follow Nutritious Diet - Try and increase protein intake. Goal of protein intake is 60g-100g. Lymphedema Treatment Plan - Exercise, Compression and Elevation Bilateral Lower Extremities Exercise daily as tolerated. (Walking, ROM, Calf Pumps and Toe Taps) Elevate legs 30 - 60 minutes at or above heart level at least 3 - 4 times daily as able/tolerated Avoid standing for long periods and elevate leg(s) parallel to the floor when sitting Use Pneumatic Compression Device on leg(s) 2-3 times a day for 45-60 minutes - x3 a day Wound Treatment Wound #17 - Lower Leg Wound Laterality: Right, Anterior Cleanser: Soap and Water Every Other Day/30 Days Discharge Instructions: May shower and wash wound with dial antibacterial soap and water prior to dressing change. Peri-Wound Care: Sween Lotion (Moisturizing lotion) Every Other Day/30 Days Discharge Instructions: Apply moisturizing lotion as directed Topical: Gentamicin Every Other Day/30 Days Discharge Instructions: As directed by physician Topical: Mupirocin Ointment Every Other Day/30 Days Discharge Instructions: Apply Mupirocin  (Bactroban) as instructed Topical: Ketoconazole Cream 2% Every Other Day/30 Days Discharge Instructions: Apply Ketoconazole as directed Prim Dressing: Maxorb Extra CMC/Alginate Dressing, 4x4 (in/in) (Generic) Every Other Day/30 Days ary Discharge Instructions: Apply to wound bed as instructed Secondary Dressing: ABD Pad, 8x10 Every Other Day/30 Days Discharge Instructions: Apply over primary dressing as directed. Secondary Dressing: Zetuvit Plus 4x8 in Every Other Day/30 Days Discharge Instructions: Apply over primary dressing as directed. Secondary Dressing: OptiLock Super Absorbent, 5x5.5 (in/in) (Generic) Every Other Day/30 Days Discharge Instructions: Apply directly to wound bed as directed Compression Wrap: Urgo K2, (equivalent to a 4 layer) two layer compression system, regular Every Other Day/30 Days Discharge Instructions: Apply Urgo K2 as directed (alternative to 4 layer compression). Wound #6 - Lower Leg Wound Laterality: Right, Medial Cleanser: Soap and Water Every Other Day/30 Days Discharge Instructions: May shower and wash wound with dial antibacterial soap and water prior to dressing change. Peri-Wound Care: Sween Lotion (Moisturizing lotion) Every Other Day/30 Days Discharge Instructions: Apply moisturizing lotion as directed Topical: Gentamicin Every Other Day/30 Days Discharge Instructions: As directed by physician Topical: Mupirocin Ointment Every Other Day/30 Days Discharge Instructions: Apply Mupirocin (Bactroban) as instructed Topical: Ketoconazole Cream 2% Every Other Day/30 Days Discharge Instructions: Apply Ketoconazole as directed Prim Dressing: Maxorb Extra CMC/Alginate Dressing, 4x4 (in/in) (Generic) Every Other Day/30 Days ary Discharge Instructions: Apply to wound bed as instructed Secondary Dressing: ABD Pad, 8x10 Every Other Day/30 Days Discharge Instructions: Apply over primary dressing as directed. Secondary Dressing: Zetuvit Plus 4x8 in Every Other  Day/30 Days Discharge  Instructions: Apply over primary dressing as directed. Secondary Dressing: OptiLock Super Absorbent, 5x5.5 (in/in) (Generic) Every Other Day/30 Days Discharge Instructions: Apply directly to wound bed as directed Compression Wrap: Urgo K2, (equivalent to a 4 layer) two layer compression system, regular Every Other Day/30 Days Discharge Instructions: Apply Urgo K2 as directed (alternative to 4 layer compression). Boteler, Jullian ([id number]) [id number] Page 7 of 15 Wound #7 - Lower Leg Wound Laterality: Left, Circumferential Cleanser: Soap and Water Every Other Day/30 Days Discharge Instructions: May shower and wash wound with dial antibacterial soap and water prior to dressing change. Peri-Wound Care: Sween Lotion (Moisturizing lotion) Every Other Day/30 Days Discharge Instructions: Apply moisturizing lotion as directed Topical: Gentamicin Every Other Day/30 Days Discharge Instructions: As directed by physician Topical: Mupirocin Ointment Every Other Day/30 Days Discharge Instructions: Apply Mupirocin (Bactroban) as instructed Topical: Ketoconazole Cream 2% Every Other Day/30 Days Discharge Instructions: Apply Ketoconazole as directed Prim Dressing: Maxorb Extra CMC/Alginate Dressing, 4x4 (in/in) (Generic) Every Other Day/30 Days ary Discharge Instructions: Apply to wound bed as instructed Secondary Dressing: ABD Pad, 8x10 Every Other Day/30 Days Discharge Instructions: Apply over primary dressing as directed. Secondary Dressing: Zetuvit Plus 4x8 in Every Other Day/30 Days Discharge Instructions: Apply over primary dressing as directed. Secondary Dressing: OptiLock Super Absorbent, 5x5.5 (in/in) (Generic) Every Other Day/30 Days Discharge Instructions: Apply directly to wound bed as directed Compression Wrap: Urgo K2, (equivalent to a 4 layer) two layer compression system, regular Every Other Day/30 Days Discharge Instructions: Apply Urgo  K2 as directed (alternative to 4 layer compression). Electronic Signature(s) Signed: 05/29/2023 11:00:26 AM By: Rosan Raisin DO Previous Signature: 05/27/2023 6:36:40 PM Version By: [name] Pollen RN Previous Signature: 05/27/2023 5:26:35 PM Version By: Rosan Raisin DO Entered By: Rosan Raisin on 05/29/2023 07:36:07 -------------------------------------------------------------------------------- Problem List Details Patient Name: Date of Service: Storr, [name] 05/27/2023 2:45 PM Medical Record Number: [id number] Patient Account Number: [id number] Date of Birth/Sex: Treating RN: 27-Jan-1946 (78 y.o. 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) with ulcer and inflammation of 06/11/2022 No Yes bilateral lower extremity I89.0 Lymphedema, not elsewhere classified 03/26/2022 No Yes L97.828 Non-pressure chronic ulcer of other part of left lower leg with other specified 03/26/2022 No Yes severity L97.818 Non-pressure chronic ulcer of other part of right lower leg with other specified 03/26/2022 No Yes Antilla, [name] ([id number]) [phone].pdf Page 8 of 15 severity I50.42 Chronic combined systolic (congestive) and diastolic (congestive) heart failure 05/06/2023 No Yes Inactive Problems ICD-10 Code Description Active Date Inactive Date L84 Corns and callosities 10/15/2022 10/15/2022 Resolved Problems Electronic Signature(s) Signed: 05/29/2023 11:00:26 AM By: Rosan Raisin DO Entered By: Rosan Raisin on 05/29/2023 07:18:53 -------------------------------------------------------------------------------- Progress Note Details Patient Name: Date of Service: Mccleary, [name] 05/27/2023 2:45 PM Medical Record Number:  [id number] Patient Account Number: [id number] Date of Birth/Sex: Treating RN: 1946-02-11 (78 y.o. M) Primary Care Provider: Claudene Salena RAMAN Other Clinician: Referring Provider: Treating Provider/Extender: Rosan Raisin Claudene Salena RAMAN Weeks in Treatment: 70 Subjective Chief Complaint Information obtained from Patient 02/23/2020; patient is here for wounds on his bilateral lower legs in the setting of severe lymphedema 03/26/2022; patient is here for wounds on his bilateral lower legs in the setting of severe lymphedema History of Present Illness (HPI) ADMISSION 02/23/2020 Patient is a 78 year old man who lives in [place] who arrives accompanied by his wife. He has  a history of chronic lymphedema and venous insufficiency in his bilateral lower legs which may have something to do that with having a history of DVT as well as being treated for prostate cancer. In any case he recently got compression pumps at home but compliance has been an issue here. He has compression stockings however they are probably not sufficient enough to control swelling. They tell us  that things deteriorated for him in late August he was admitted to [facility] for 7 days. This was with cellulitis I think of his bilateral lower legs. Discharge he was noted to have wounds on his bilateral lower legs. He was discharged on Bactrim. They tried to get him home health through [employer] Medicare part C of course they declined him. His wife is been wrapping these applying some form of silver foam dressing. He has a history of wounds before although nothing that would not heal with basic home topical dressings. He has 2 areas on the left medial, left anterior and left lateral and a smaller area on the right medial. All of these have considerable depth. Past medical history includes iron deficiency anemia, lymphedema followed by the rehab center at [facility] with lymphedema wraps I believe, DVT on chronic  anticoagulation, prostate cancer, chronic venous insufficiency, hypertension. As mentioned he has compression pumps but does not use them. ABIs in our clinic were noncompressible bilaterally 10/14; patient with severe bilateral lymphedema right greater than left. He came in with bilateral lower extremity wounds left greater than right. Even though the right side has more of the edema most of the wounds here almost closed on the right medial. He has 3 remaining wounds on the left We have been using silver alginate under 4-layer compression I have been trying to get him to be compliant with his external compression pumps 10/21; patient with 3 small wounds on the left leg and 1 on the right medial in the setting of severe lymphedema and chronic venous insufficiency. We have been using silver alginate under 4-layer compression he is using his external compression pumps twice a day 11/4; ARTERIAL STUDIES on the right show an ABI of 1.02 TBI of 0.858 with biphasic waveforms on the left 0.98 with a TBI of 0.55 and biphasic waveforms. Does not look like he has significant arterial disease. We are treating him for lymphedema he has compression pumps. He has punched-out areas on the left anterior left lateral and right medial lower extremities 11/11; after we obtained his arterial studies I put him in 4 layer compression. He is using his compression pumps probably once a day although I have asked him to do twice. Primary dressing to the wound is silver collagen he has severe lymphedema likely secondary to chronic venous insufficiency. Wounds on the left lateral, left medial and left anterior and a small area on the right medial Chizmar, [name] ([id number]) [id number] Page 9 of 15 12/2; the area on the right anterior lower leg has healed. We initially thought that the area medially had healed as well however when her discharge nurse came in she detected fluid in the wound simply  opened up. This is actually worse than I remember this pain. The area on the left lateral potentially slightly smaller He is also complaining about pain in his left hand he says that this is actually been getting some better he has been using topical creams on this. She asked that I look at this 12/9 after last weeks issues we have 2 wounds  one on the right medial lower leg and 1 on the left lateral. Both of these are in the same condition. I think because of thickened skin secondary to chronic lymphedema these wounds actually have depth of almost 0.8 cm. 12/16; the patient has 2 small but deep wounds one on the right medial and one on the left lateral. The right medial is actually the worst of these. He arrives in clinic today with absolutely terrible edema in the right leg apparently his 4-layer wrap fell down to just above his ankle he did not think about this he is apparently been continuing to use his compression pump twice a day. The left leg looks a lot better. 05/09/2020 upon evaluation today patient appears to be doing decently well in regard to his wounds. Everything is measuring smaller the right leg still has a little bit deeper wound in the left seems to be almost completely healed in my opinion I am very pleased in general with how things are progressing. He has a 4- layer compression wrap we have been using endoform today we will probably have to use collagen just based on the fact that we do not have endoform it is on order. 1/6; the patient's wound on the left lateral lower leg has healed. Still has 1 on the right medial. 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 Using compression pumps at home twice a day. 1/13; left lateral lower leg is still healed. He has a deep punched out rectangular shaped wound on the right medial calf. Looking down at this it appears that he is attempting to epithelialize around the edges of the wound and on the base as well. His edema is  reasonably well controlled we have been using collagen with absolutely no effect 1/20; left lateral lower leg remains closed he has extremitease stockings. The area on the right medial calf I aggressively debrided last week measures larger but the surface looks better. We have been using Hydrofera Blue. We ran Oasis through his insurance but we have not seen the results of this 1/27; left lower leg wound with chronic venous insufficiency and secondary lymphedema. I did aggressive debridement on this last week the wound seems to have come in healthy looking surface using Hydrofera Blue. He was denied for Oasis 2/3; small divot in the right medial lower leg. Under illumination the walls of this divot are epithelialized however the base has slough which I removed with a curette we have been using Hydrofera Blue 2/10 small divot on the right medial lower leg pinpoint illumination at the base of this cone-shaped wound. We have been using Hydrofera Blue but I will switch to calcium alginate this week 2/17; the small divot on the right medial lower leg is fully epithelialized. There is no visible open area under illumination. He has his own stocking for the right leg similar to the one he has been wearing on the left. 03/26/2022; READMISSION This is a now 78 year old man that we had in the clinic from 02/23/2020 through 07/05/2020. At that point he had bilateral lower extremity wounds left greater than right in the setting of severe lymphedema. He had already obtained compression pumps ordered for him I think from the wound care clinic in [place]  so I do not really have record of what he has been using. He claims to be using them once a day but there is a problem with the sleeve on the left leg. About 2 weeks ago he was  hospitalized from 03/11/2022 through 03/14/2022 with diastolic congestive heart failure. 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  He was diuresed. Developed some prerenal azotemia and he has not been taking any diuretics currently. He has not been putting stockings on his legs since he got out of hospital and still has his legs dependent for long periods. Past medical history history of prostate cancer treated with prostatectomy and radiation this was apparently about 8 years ago, history of DVT on chronic Coumadin , history of lymphedema was managed for a while at the clinic in [place]. History of inguinal hernia repair in September 22, hypertension, stage IIIb chronic renal failure ABIs today were noncompressible on the right 1.12 on the left 04-02-2022 upon evaluation today patient appears to be doing well currently in regard to his legs I do feel like both areas that are draining are actually much drier than they were in the picture last week although the left is drier than the right. He is tolerating the 4-layer compression wraps at this point he did contact the pump company and they are actually working on getting him a new compression sleeve for one of his legs which have previously popped and was not functioning properly. 04-23-2022 upon evaluation today patient appears to be doing well currently in regard to his wounds on the legs. I am actually very pleased with where things stand and I do feel like that we are headed in the right direction. Fortunately there is no sign of active infection locally or systemically at this time. 05-07-2022 upon evaluation today patient appears to be doing well currently in regard to his wounds in fact things are showing signs of improvement which is good news I do not see too much that actually appears to be open and I am very pleased in that regard. No fevers, chills, nausea, vomiting, or diarrhea. 05-21-2022 upon evaluation today patient appears to be doing somewhat poorly in regard to drainage of his lower extremities bilaterally. The right is greater than left as far as the weeping  area. Nonetheless it seems to be getting worse not better. He actually has pitting edema which is at least 2+ to the thighs and I am concerned about the fact that he is may be fluid overloaded in general and that is the reason why we cannot get this under control. I know he is not using his pumps all the time because he actually told the nurse that he was either going to pump or he was going to use his fluid pills but not do both. For that reason I do think that he needs to be really doing both in order to get the fluid out as effectively as possible obviously with the 4-layer compression wraps were doing as much as we can from a compression standpoint but it is really not enough. He tells me that he elevates his leg is much as he can in between pumping and other activity throughout the day. 05-28-2022 upon evaluation today patient appears to be doing better in regard to his wounds although the measurements may be a little bit larger this is a very difficult wound to heal it is very indistinct in a lot of areas. Nonetheless there is can be some need for sharp debridement in regard to both medial and lateral legs. Fortunately I see no signs of active infection locally nor systemically at this time. No fevers, chills, nausea, vomiting, or diarrhea. 06-04-2022 upon evaluation today  patient appears to be doing poorly in general in regard to the wounds on his legs. He still continues to have a tremendous amount of fluid not just in the lower portion of his leg but to be honest his thigh where he has 2-3+ pitting edema in the thigh as well. Unfortunately I do not know that we will be able to get this healed effectively and keep it healed on the lower extremities unless he gets the overall fluid situation taking and under control. Fortunately I do not see any signs of infection locally nor systemically which is great news. He just seems to be very fluid overloaded. 06-11-2022 upon evaluation today patient  presents for follow-up concerning his bilateral lower extremity lymphedema secondary to chronic venous insufficiency. He has been tolerating the dressing changes with the compression wraps without complication. Fortunately I do not see any evidence of infection at this time which is great news. No fevers, chills, nausea, vomiting, or diarrhea. 06-18-2022 upon evaluation today patient appears to be doing well currently in regard to his wounds as far as not looking like they are terribly infected but nonetheless I am concerned about a subacute infection secondary to the fact that he continues to have spreading despite the compression therapy. We actually did do an Unna boot on him last week this is actually the first wrap that actually stayed up everything else has been sliding down quite significantly. Fortunately there does not appear to be any signs of infection systemically at this time. With that being said I do believe that locally there seems to be an issue going on here and again I Dimas do a PCR culture to see what that shows also think that I am going to put him on a broad-spectrum antibiotic, doxycycline to see how that will help as well. He does tell me that coming into the clinic today that he was feeling short of breath like he was about to have a heart attack because he was having such a hard time breathing. He says that he told this to Dr. Graeme his cardiologist as well when he was evaluated in the past 1 to 2 weeks. Bufford, [name] ([id number]) [id number] Page 10 of 15 07-02-2022 upon evaluation today patient appears to be doing poorly currently in regard to his wound. He has been tolerating the dressing changes. Unfortunately he has not had any compression wraps on for the past week because he was unable to make it in for his appointment last week. With that being said he has a significant amount of drainage he tells me has been using his pumps but despite  this in the pumps he still has been draining quite a bit. The drainage is also somewhat purulent unfortunately. We did attempt to get in touch with his cardiologist last week unfortunately we were unable to get up with him I did advise that the patient needs to get in touch with him upon leaving today in order to make sure they know he is on the new antibiotics I am going to send him this will be Levaquin and Augmentin. 07-09-2022 upon evaluation today patient appears to be doing about the same in regard to his legs he may have just a slight amount of improvement with regard to the drainage probably Keystone topical antibiotics are helping in this regard to some degree. Fortunately there does not appear to be any signs of active infection systemically which is great news. No fevers, chills, nausea, vomiting, or diarrhea. 07-16-2022 upon  evaluation today patient appears to be doing well currently in regard to his wound. He has been tolerating the dressing changes without complication. Fortunately there does not appear to be any signs of active infection locally nor systemically at this time. With that being said he cannot keep the wraps up he tells me on the left side he had to cut this down because it got too tight. He has been using his pumps but he is on the right side the wrap actually straight down causing some pushing around the central part of his leg just below the calf I think this is a bigger risk for him that help at this point. I think that we may need to try something different he should be getting his compression socks shortly he tells me they were ordered last Thursday. 3/6; ; this is a patient who lives in [place]. He has severe bilateral lymphedema. He has compression pumps, we have been using kerlix Ace wrap Keystone. He is changing the dressing. We do not have home health. 08-06-2022 upon evaluation today patient appears to be doing a little better in regard to his wounds in general at  this point. Fortunately there does not appear to be any signs of active infection locally nor systemically at this time which is great news and overall I am extremely pleased with where we stand today. 08-13-2022 upon evaluation today patient appears to actually be doing significantly better compared to last week. He actually did go to the hospital I told him that he needed to when he left here and he actually did go. With that being said they actually ended up admitting him he was having shortness of breath and I thought it might be related to congestive heart failure turns out he actually had a pulmonary embolism. Subsequently they were able to get him off of the Coumadin  switching over to Eliquis  to get things stabilized in that regard they also had them wrapped and got his swelling under control on his legs he actually looks much better pretty much across the board at this point. I am very pleased in that regard. With that being said I am very happy that he finally went that could have been a very dangerous situation. 08-20-2022 upon evaluation today patient appears to be doing well currently in regard to his wound. Has been tolerating the dressing changes without complication. Fortunately there does not appear to be any signs of active infection locally nor systemically at this time. I think his legs are doing better there is some need for sharp debridement today. 08-27-2022 upon evaluation patient is actually making excellent progress. I am actually very pleased with where he stands and I think that he is moving in the right direction. In general I think that we are looking pretty good at the moment. 09-03-2022 upon evaluation today patient appears to be doing well currently in regard to his wound. He is actually tolerating dressing changes on the left and right leg without complication. Fortunately I do not see any need for debridement of the left leg the right leg I think we probably do some need to  perform some debridement here. 09-10-2022 upon evaluation today patient's wounds actually showed signs of improvement in both legs I do not see much is going require debridement today which was great news. Fortunately I do not see any evidence of infection which I think is also excellent he seems to be using his pumps and doing everything right I am happy  about how this is progressing at this point. 09-17-2022 upon evaluation today patient appears to be doing decently well in regard to his wounds. He has been tolerating the dressing changes without complication. Fortunately there does not appear to be any signs of active infection at this time which is good news. 09-24-2022 upon evaluation today patient appears to be doing well currently in regard to his wounds. He has been tolerating the dressing changes without complication. Fortunately there does not appear to be any signs of active infection locally nor systemically which is great news. No fevers, chills, nausea, vomiting, or diarrhea. 10-08-2022 upon evaluation today patient appears to be doing excellent currently in regard to his wound. He has been tolerating the dressing changes without complication though it does not sound like he has been using his compression wraps for a bit here. He does think he was doing better with the [facility] topical antibiotics we can definitely go back to using that but I think the biggest issue here is that his swelling is just very out of control and needs to be under control. I discussed that with him today. 10-15-2022 upon evaluation today patient appears to be doing well currently in regard to his wounds. He is actually making some progress which is good news. Fortunately I do not see any evidence of active infection locally nor systemically which is great news as well. No fevers, chills, nausea, vomiting, or diarrhea. He does have a callused area on the plantar aspect of his left foot which is actually causing some  pain and he wonders if I can trim this down for him. 10-22-2022 upon evaluation today patient seems to be making progress. He is actually doing quite well and very pleased in that regard. I do not see any signs of active infection at this time. 11-05-2022 upon evaluation patient appears to be making progress although slowly towards closure. He seems to be doing well with regard to his legs were still using the [facility] topical antibiotics and he seems to be doing quite well. He is going require some sharp debridement today. 11-19-2022 upon evaluation today patient unfortunately appears to be extremely swollen at this point. He tells me that he ran out of supplies he also tells me his leg started leaking more because of not having supplies he was unable to wear his compression wraps that is the juxta fit compression wraps. Therefore his legs are extremely swollen much larger than normal and do not appear to be doing well at all today. He is going require some debridement I also think he is going require us  to perform compression wrapping today. 11-26-2022 upon evaluation today patient appears to be doing well currently in regard to his legs as far as infection is concerned I see nothing that appears to be infected. Fortunately I do not see any signs of active infection systemically either which is also good news. With that being said he still is extremely swollen as far as his legs are concerned. I do not see any signs of overall worsening but also do not see any signs of significant improvement which is the major issue here. 12-03-2022 upon evaluation today patient appears to be doing poorly still in regard to his legs the apparently has been taken off the wraps and not leaving the week by week. With that being said he has not had really the dressings to put on the release that is running out and subsequently though he is using his wraps I am  not sure that he has been keeping them on like he needs to. I  explained to him I really wanted to have the wraps that I put on him left on until he comes back to see me so that we can keep the compression under better control. He voiced understanding today he tells me that he was confused about that. Nonetheless based on what I am seeing right now also think that he is up on his feet too much I think he needs to get the feet elevated. 7/24; this is a patient with severe bilateral stage III lymphedema. On the right lower leg medially is a large area of macerated denuded skin was too small deeper areas in this. On the left leg he is 2 areas that are a little more standard in terms of wounds. Massive lymphedema bilaterally. He has compression pumps at home which he uses for 1 hour twice a day 12-17-2022 upon evaluation today patient's legs though a little bit smaller still continue to have significant issues with weeping here this just does not dry. I really think he needs to be changing this daily and this means we will probably have to try to go back to the Velcro wraps and give this a shot. I think may be going to the Velcro wraps, changing the dressing to a superabsorber like XtraSorb or the like, and have him elevate his legs and do his pumps 3 times per day is probably going to be the way to go to try to see if we can make some improvement here. Gallacher, Robin ([id number]) [id number] Page 11 of 15 12-24-2022 upon evaluation today patient unfortunately is still struggling to get anything under control. We have been extremely aggressive and trying to: Control his swelling everything we do however seems to be met with resistance. He tells me that he is pumping 3 times per day with his lymphedema pumps. He also tells me that he is try to elevate his legs is much as possible and that subsequently he has been keeping his compression wraps on. He goes back and forth between saying that he can keep our wraps on and not but right now I  really do not see that he is doing well with the juxta fit is at least not from the standpoint of improving the overall appearance I think it does to some degree help maintain but we are still struggling here. I have voiced a concern to the patient that despite everything you are doing I am still not seeing a lot of improvement here and to be honest I am not sure what is can take get this under control he may have to go into the hospital for admission. 12-31-2022 upon evaluation today patient appears to be doing well currently in regard to his wounds. They seem a little better although he has several pustules around the legs in general but has been concerned about infection. I am actually going to go ahead and see about getting an antibiotic sent into the pharmacy today although I am going to obtain a PCR culture as well. 01-07-2023 upon evaluation today patient appears to be doing well currently in regard to his wound. Has been tolerating the dressing changes without complication. He actually seems to be doing much better. He did have a PCR culture which showed Serratia as well and the prominent organisms here. Levaquin was actually a good option to treat the Serratia along with the other organisms that were problematic including E. coli.  Nonetheless I think coupled with the topical Keystone antibiotics and the alginate he is really doing quite well today. 8/28; patient with severe bilateral lymphedema he has wounds bilaterally on his lower legs but they look a lot better today. We are using silver alginate and Keystone with Urgo K2 lite wraps. He comes in today with his wounds looking quite good. He was prescribed Levaquin last time I think he will complete this in a few days. 01-21-2023 upon evaluation today patient appears to be doing worse in regard to his swelling took his wrap off he tells me he believes on Saturday. With that being said his leg is significantly swollen since that time and  unfortunately does not appear to be doing nearly as good as it was last time I saw him 2 weeks ago or even last week for that matter. I am very concerned about this. 01-28-2023 upon evaluation today patient appears to be doing poorly currently in regard to his legs. He has been doing everything he says that we have recommended, he continues to use his lymphedema pumps 3 times a day, he has been doubling up on his fluid pills, he has been elevating his legs, and he tells me that he also has kept the wraps on and in fact he did have them on the day when he came in. Again that for feels the compression side of things. Nonetheless with everything going on here we still have not been able to get this completely under control. He continues to have issues with significant swelling of lower extremities and this is not limited to just his lower left portion of his legs but his thighs are fairly large as well. With that being said I really feel like that we have reached the extent of what we have to offer here at the wound care center I am wondering if we can see about making a referral for second opinion to [facility] to see if there is anything they could do to help him out at this point. It is really not much further from his home to there as it is from his home to here. Both are about an hour distance. He lives in [place] . 02-04-2023 upon evaluation today patient appears to be doing much better actually compared to last week's evaluation. This is actually significant improvement and overall I do not know exactly what he did different other than he tells me that he has been taking his fluid pills every day instead of every other day. Obviously this is needed. I was under the understanding previously that he was taking this every day but I think he may also not been pumping every day like he was telling me but I cannot be sure this either way he does seem to be doing what he supposed to do at this  point. 02-11-2023 upon evaluation today patient unfortunately appears to be about as bad today as he appeared good last week. I am not sure what happened other than the fact he did take his wrap off the right leg he tells me it got so tight that he could not feel his toes they are getting very numb. Subsequently he tells me that it was difficulty with driving therefore he took it off. With that being said I do believe that he may need to go to the ER for evaluation and treatment I feel he may need to be admitted to get some of the fluid off of his legs. I  think that he is volume overloaded and at this point I do not think there is much working to be able to do to manage this outpatient we have tried pretty much everything that I am going to do. 02-18-23 upon evaluation today patient appears to be doing pretty well currently compared to last week this is actually better today. He is less weeping and the legs are actually significantly smaller. He tells me has been trying to stay in bed with his legs elevated more. 02-25-2023 upon evaluation today patient appears to be doing well currently in regard to his legs in general compared to where we have been. Fortunately I do not see any signs of active infection locally or systemically which is great news and in general I do believe that we are making good headway here towards complete closure. I am hopeful that he will continue to show signs of improvement as we progress going forward. He does seem to have been taking his fluid pills as well as elevating his legs and using lymphedema pumps on a regular basis. He is trying to stay out of the hospital. 03-04-2023 upon evaluation today patient appears to be doing well currently in regard to his wound. He has been tolerating the dressing changes without complication. Fortunately there does not appear to be any signs of active infection at this time. 03-11-2023 upon evaluation today patient appears to be doing well  currently in regard to his wounds. He has been tolerating the dressing changes without complication. Fortunately I do not see any signs of active infection at this time which is great news. 11/6; patient missed his clinic appointment last week. He took his wraps off at some point. Did not take his diuretics because he was nauseated last week. He comes in with massive bilateral edema which is lymphedema and probably some degree of systemic fluid overload. 11/13; patient with very poorly controlled lymphedema. We have been putting 4-layer equivalent wraps on him he says he is using his compression pumps twice a day. And elevating his legs nevertheless the edema control today is very poor. He has a substantial wound on the right medial lower leg also of the left medial and left posterior. We have been using alginate as a primary dressing or Aquacel Ag 11/20; some improvement in the amount of edema. We have been using 4-layer equivalent wraps and he is pumping with his external compression pumps twice a day. His legs had improved in terms of edema although there is still massively edematous. He tells me that he is on torsemide  1-1/2 tablets 20 mg tabs a day 12/4; massive edema extending up into his groin. Interestingly his edema in the thighs is more pitting then nonpitting lymphedema. His wounds are on both legs. This is not something that I think we can debride although I did do this last week I am not sure this did him any good. We have been using 4-layer compression he is using his pumps twice a day. I misunderstood his torsemide  dosage which apparently is 1 tablet alternating with a half a tablet daily. I think he probably needs more than that. Unfortunately he cannot get a hold of his cardiologist... 12/11 massive edema in the bilateral lower legs extending up into his posterior thighs towards his groin. He tells me he took his compression wrap off on Sunday. He says he is using his external  compression pumps twice a day. He has wounds predominantly on the right medial leg left lateral left  anterior and left medial leg. The patient states he has tried to contact his cardiologist vis--vis diuretic use and he just cannot get a hold of anybody in the office 12/18; massive edema in his bilateral lower legs extending up into his groin. He has an elevated jugular venous pressure. He tells me his torsemide  was increased to 30 mg he takes 130 mg tablet. He has wounds on the right medial left medial left anterior and left lateral lower legs they are covered in a nonviable surface but with this amount of edema debridement just is not indicated. He says he is using his compression pumps 3 times a day he had to cut down the wraps because they were too loose. 05/27/2023; patient presents for follow-up. He has not been here in 3 weeks. He states he is using his lymphedema pumps. He is using silver alginate to the wound beds under his Velcro compression wraps. Brouillet, [name] ([id number]) [id number] Page 12 of 15 Objective Constitutional respirations regular, non-labored and within target range for patient.. Vitals Time Taken: 3:39 PM, Height: 74 in, Weight: 250 lbs, BMI: 32.1, Temperature: 98 F, Pulse: 90 bpm, Respiratory Rate: 18 breaths/min, Blood Pressure: 163/64 mmHg. Cardiovascular 2+ dorsalis pedis/posterior tibialis pulses. Psychiatric pleasant and cooperative. General Notes: Scattered open wounds to the lower extremities bilaterally with non viable tissue. Stage IV lymphedema. No obvious signs of overt soft tissue infection. Integumentary (Hair, Skin) Wound #12 status is Converted. Original cause of wound was Frostbite. The date acquired was: 12/17/2022. The wound has been in treatment 23 weeks. The wound is located on the Left,Anterior Lower Leg. The wound measures 11cm length x 53cm width x 0.1cm depth; 457.887cm^2 area and 45.789cm^3 volume. There is Fat  Layer (Subcutaneous Tissue) exposed. There is no tunneling or undermining noted. There is a large amount of serous drainage noted. The wound margin is fibrotic, thickened scar. There is small (1-33%) pink granulation within the wound bed. There is a large (67-100%) amount of necrotic tissue within the wound bed including Adherent Slough. The periwound skin appearance had no abnormalities noted for moisture. The periwound skin appearance had no abnormalities noted for color. The periwound skin appearance exhibited: Scarring. Periwound temperature was noted as No Abnormality. Wound #15 status is Converted. Original cause of wound was Gradually Appeared. The date acquired was: 01/14/2023. The wound has been in treatment 14 weeks. The wound is located on the Left,Distal,Posterior Lower Leg. The wound measures 0.5cm length x 1cm width x 0.1cm depth; 0.393cm^2 area and 0.039cm^3 volume. There is a medium amount of serosanguineous drainage noted. Wound #16 status is Converted. Original cause of wound was Blister. The date acquired was: 04/01/2023. The wound has been in treatment 8 weeks. The wound is located on the Left,Lateral Lower Leg. The wound measures 6.5cm length x 6cm width x 0.1cm depth; 30.631cm^2 area and 3.063cm^3 volume. There is a medium amount of serous drainage noted. Wound #17 status is Open. Original cause of wound was Gradually Appeared. The date acquired was: 05/20/2023. The wound is located on the Right,Anterior Lower Leg. The wound measures 3cm length x 2cm width x 0.1cm depth; 4.712cm^2 area and 0.471cm^3 volume. There is Fat Layer (Subcutaneous Tissue) exposed. There is no tunneling or undermining noted. There is a large amount of serosanguineous drainage noted. Foul odor after cleansing was noted. There is no granulation within the wound bed. There is a large (67-100%) amount of necrotic tissue within the wound bed including Adherent Slough. The periwound skin appearance did not exhibit:  Callus, Crepitus, Excoriation, Induration, Rash, Scarring, Dry/Scaly, Maceration, Atrophie Blanche, Cyanosis, Ecchymosis, Hemosiderin Staining, Mottled, Pallor, Rubor, Erythema. Wound #6 status is Open. Original cause of wound was Gradually Appeared. The date acquired was: 03/05/2022. The wound has been in treatment 61 weeks. The wound is located on the Right,Medial Lower Leg. The wound measures 13.5cm length x 22cm width x 0.2cm depth; 233.263cm^2 area and 46.653cm^3 volume. There is Fat Layer (Subcutaneous Tissue) exposed. There is no tunneling or undermining noted. There is a large amount of serous drainage noted. The wound margin is distinct with the outline attached to the wound base. There is no granulation within the wound bed. There is a large (67-100%) amount of necrotic tissue within the wound bed including Adherent Slough. The periwound skin appearance exhibited: Scarring, Hemosiderin Staining. The periwound skin appearance did not exhibit: Callus, Crepitus, Excoriation, Induration, Rash, Dry/Scaly, Maceration, Atrophie Blanche, Cyanosis, Ecchymosis, Mottled, Pallor, Rubor, Erythema. Periwound temperature was noted as No Abnormality. Wound #7 status is Open. Original cause of wound was Gradually Appeared. The date acquired was: 03/05/2022. The wound has been in treatment 61 weeks. The wound is located on the Left,Circumferential Lower Leg. The wound measures 11cm length x 53cm width x 0.1cm depth; 457.887cm^2 area and 45.789cm^3 volume. There is Fat Layer (Subcutaneous Tissue) exposed. There is no tunneling or undermining noted. There is a large amount of serous drainage noted. The wound margin is distinct with the outline attached to the wound base. There is small (1-33%) red granulation within the wound bed. There is a large (67-100%) amount of necrotic tissue within the wound bed including Adherent Slough. The periwound skin appearance exhibited: Scarring, Hemosiderin Staining.  The periwound skin appearance did not exhibit: Callus, Crepitus, Excoriation, Induration, Rash, Dry/Scaly, Maceration, Atrophie Blanche, Cyanosis, Ecchymosis, Mottled, Pallor, Rubor, Erythema. Periwound temperature was noted as No Abnormality. 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 We discussed the lymphedema clinic and he does not seem interested in this option. He does need better control of his chronic condition. I am not quite sure why he has not been here in the past 3 weeks. He needs more aggressive care. At this time I recommended antibiotic ointment to address bioburden and ketoconazole to address any fungal component to the wound beds. Recommended 4-layer compression to lower extremities bilaterally. Continue lymphedema pumps. Follow-up in 1 week. Procedures Barb, [name] ([id number]) [id number] Page 13 of 15 Wound #17 Pre-procedure diagnosis of Wound #17 is a Lymphedema located on the Right,Anterior Lower Leg . There was a Four Layer Compression Therapy Procedure by [name] Pollen, RN. Post procedure Diagnosis Wound #17: Same as Pre-Procedure Wound #6 Pre-procedure diagnosis of Wound #6 is a Lymphedema located on the Right,Medial Lower Leg . There was a Four Layer Compression Therapy Procedure by [name] Pollen, RN. Post procedure Diagnosis Wound #6: Same as Pre-Procedure Wound #7 Pre-procedure diagnosis of Wound #7 is a Lymphedema located on the Left,Circumferential Lower Leg . There was a Four Layer  Compression Therapy Procedure by [name] Pollen, RN. Post procedure Diagnosis Wound #7: Same as Pre-Procedure Plan Follow-up Appointments: Return Appointment in 1 week. - +++ Extra Time++++Dr. Rosan Tuesday 06/02/23 at 2:45pm (room 9) +++EXTRA  TIME+++++ Other: - take your fluid pills as prescribed. Speak with cardiologist about increasing fluid pills T wraps off in 1 wk, dress wounds yourself, wear stockings, ake continue to pump 3x day Anesthetic: (In clinic) Topical Lidocaine  4% applied to wound bed Bathing/ Shower/ Hygiene: May shower with protection but do not get wound dressing(s) wet. Protect dressing(s) with water repellant cover (for example, large plastic bag) or a cast cover and may then take shower. Edema Control - Orders / Instructions: Segmental Compressive Device. Use the Segmental Compressive Device on leg(s) 2-3 times a day for 45 - 60 minutes. If wearing any wraps or hose, do not remove them. Continue exercising as instructed. - x3 a day Elevate legs to the level of the heart or above for 30 minutes daily and/or when sitting for 3-4 times a day throughout the day. Avoid standing for long periods of time. Exercise regularly Off-Loading: Other: - When sitting please keep your feet up, keep legs at Heart Level or above. Use pillows behind calf to for comfort. Additional Orders / Instructions: Follow Nutritious Diet - Try and increase protein intake. Goal of protein intake is 60g-100g. Lymphedema Treatment Plan - Exercise, Compression and Elevation: Exercise daily as tolerated. (Walking, ROM, Calf Pumps and T T oe aps) Elevate legs 30 - 60 minutes at or above heart level at least 3 - 4 times daily as able/tolerated Avoid standing for long periods and elevate leg(s) parallel to the floor when sitting Use Pneumatic Compression Device on leg(s) 2-3 times a day for 45-60 minutes - x3 a day WOUND #17: - Lower Leg Wound Laterality: Right, Anterior Cleanser: Soap and Water Every  Other Day/30 Days Discharge Instructions: May shower and wash wound with dial antibacterial soap and water prior to dressing change. Peri-Wound Care: Sween Lotion (Moisturizing lotion) Every Other Day/30 Days Discharge Instructions: Apply moisturizing lotion as directed Topical: Gentamicin Every Other Day/30 Days Discharge Instructions: As directed by physician Topical: Mupirocin Ointment Every Other Day/30 Days Discharge Instructions: Apply Mupirocin (Bactroban) as instructed Topical: Ketoconazole Cream 2% Every Other Day/30 Days Discharge Instructions: Apply Ketoconazole as directed Prim Dressing: Maxorb Extra CMC/Alginate Dressing, 4x4 (in/in) (Generic) Every Other Day/30 Days ary Discharge Instructions: Apply to wound bed as instructed Secondary Dressing: ABD Pad, 8x10 Every Other Day/30 Days Discharge Instructions: Apply over primary dressing as directed. Secondary Dressing: Zetuvit Plus 4x8 in Every Other Day/30 Days Discharge Instructions: Apply over primary dressing as directed. Secondary Dressing: OptiLock Super Absorbent, 5x5.5 (in/in) (Generic) Every Other Day/30 Days Discharge Instructions: Apply directly to wound bed as directed Com pression Wrap: Urgo K2, (equivalent to a 4 layer) two layer compression system, regular Every Other Day/30 Days Discharge Instructions: Apply Urgo K2 as directed (alternative to 4 layer compression). WOUND #6: - Lower Leg Wound Laterality: Right, Medial Cleanser: Soap and Water Every Other Day/30 Days Discharge Instructions: May shower and wash wound with dial antibacterial soap and water prior to dressing change. Peri-Wound Care: Sween Lotion (Moisturizing lotion) Every Other Day/30 Days Discharge Instructions: Apply moisturizing lotion as directed Topical: Gentamicin Every Other Day/30 Days Discharge Instructions: As directed by physician Topical: Mupirocin Ointment Every Other Day/30 Days Discharge Instructions: Apply Mupirocin (Bactroban) as  instructed Topical: Ketoconazole Cream 2% Every Other Day/30 Days Discharge Instructions: Apply Ketoconazole as directed Prim Dressing: Maxorb Extra CMC/Alginate Dressing, 4x4 (in/in) (Generic) Every Other Day/30 Days ary Discharge Instructions: Apply to wound bed as instructed Secondary Dressing: ABD Pad, 8x10 Every Other Day/30 Days Discharge Instructions: Apply over primary dressing as  directed. Secondary Dressing: Zetuvit Plus 4x8 in Every Other Day/30 Days Discharge Instructions: Apply over primary dressing as directed. Secondary Dressing: OptiLock Super Absorbent, 5x5.5 (in/in) (Generic) Every Other Day/30 Days Discharge Instructions: Apply directly to wound bed as directed Com pression Wrap: Urgo K2, (equivalent to a 4 layer) two layer compression system, regular Every Other Day/30 Days Discharge Instructions: Apply Urgo K2 as directed (alternative to 4 layer compression). WOUND #7: - Lower Leg Wound Laterality: Left, Circumferential Winecoff, [name] ([id number]) [id number] Page 14 of 15 Cleanser: Soap and Water Every Other Day/30 Days Discharge Instructions: May shower and wash wound with dial antibacterial soap and water prior to dressing change. Peri-Wound Care: Sween Lotion (Moisturizing lotion) Every Other Day/30 Days Discharge Instructions: Apply moisturizing lotion as directed Topical: Gentamicin Every Other Day/30 Days Discharge Instructions: As directed by physician Topical: Mupirocin Ointment Every Other Day/30 Days Discharge Instructions: Apply Mupirocin (Bactroban) as instructed Topical: Ketoconazole Cream 2% Every Other Day/30 Days Discharge Instructions: Apply Ketoconazole as directed Prim Dressing: Maxorb Extra CMC/Alginate Dressing, 4x4 (in/in) (Generic) Every Other Day/30 Days ary Discharge Instructions: Apply to wound bed as instructed Secondary Dressing: ABD Pad, 8x10 Every Other Day/30 Days Discharge Instructions: Apply over primary  dressing as directed. Secondary Dressing: Zetuvit Plus 4x8 in Every Other Day/30 Days Discharge Instructions: Apply over primary dressing as directed. Secondary Dressing: OptiLock Super Absorbent, 5x5.5 (in/in) (Generic) Every Other Day/30 Days Discharge Instructions: Apply directly to wound bed as directed Com pression Wrap: Urgo K2, (equivalent to a 4 layer) two layer compression system, regular Every Other Day/30 Days Discharge Instructions: Apply Urgo K2 as directed (alternative to 4 layer compression). 1. Antibiotic ointment, antifungal cream, silver alginate under compression therapy 2. Continue lymphedema pumps 3. Follow-up in 1 week Electronic Signature(s) Signed: 05/29/2023 11:00:26 AM By: Rosan Raisin DO Entered By: Rosan Raisin on 05/29/2023 07:40:16 -------------------------------------------------------------------------------- SuperBill Details Patient Name: Date of Service: Stith, [name] 05/27/2023 Medical Record Number: [id number] Patient Account Number: [id number] Date of Birth/Sex: Treating RN: [birth year] (78 y.o. NETTY [name] Primary Care Provider: Claudene Salena RAMAN Other Clinician: Referring Provider: Treating Provider/Extender: Rosan Raisin Claudene Salena RAMAN Weeks in Treatment: 75 Diagnosis Coding ICD-10 Codes Code Description [id number] Chronic venous hypertension (idiopathic) with ulcer and inflammation of bilateral lower extremity I89.0 Lymphedema, not elsewhere classified 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.818 Non-pressure chronic ulcer of other part of right lower leg with other specified severity I50.42 Chronic combined systolic (congestive) and diastolic (congestive) heart failure Facility Procedures : CPT4: Code [id number] Description: 81 BILATERAL: Application of multi-layer venous compression system; leg (below knee), including ankle and t. Modifier: Quantity:  1 Physician Procedures Electronic Signature(s) Signed: 05/29/2023 11:00:26 AM By: Rosan Raisin DO Previous Signature: 05/27/2023 5:26:35 PM Version By: Rosan Raisin DO Previous Signature: 05/27/2023 6:36:40 PM Version By: [name] Pollen RN Entered By: Rosan Raisin on 05/29/2023 07:40:31

## 2023-06-02 ENCOUNTER — Encounter (HOSPITAL_BASED_OUTPATIENT_CLINIC_OR_DEPARTMENT_OTHER): Payer: Medicare Other | Admitting: Internal Medicine

## 2023-06-02 DIAGNOSIS — L97818 Non-pressure chronic ulcer of other part of right lower leg with other specified severity: Secondary | ICD-10-CM

## 2023-06-02 DIAGNOSIS — I890 Lymphedema, not elsewhere classified: Secondary | ICD-10-CM

## 2023-06-02 DIAGNOSIS — L97828 Non-pressure chronic ulcer of other part of left lower leg with other specified severity: Secondary | ICD-10-CM

## 2023-06-02 DIAGNOSIS — I87333 Chronic venous hypertension (idiopathic) with ulcer and inflammation of bilateral lower extremity: Secondary | ICD-10-CM | POA: Diagnosis not present

## 2023-06-03 NOTE — Progress Notes (Signed)
 Manso, [name] ([id number]) [id number] Page 1 of 14 Visit Report for 06/02/2023 Chief Complaint Document Details Patient Name: Date of Service: [name], [name] 06/02/2023 2:45 PM Medical Record Number: [id number] Patient Account Number: [id number] Date of Birth/Sex: Treating RN: July 21, 1945 (78 y.o. M) Primary Care Provider: Claudene Salena RAMAN Other Clinician: Referring Provider: Treating Provider/Extender: Rosan Harlene Claudene Salena RAMAN Weeks in Treatment: 8 Information Obtained from: Patient Chief Complaint 02/23/2020; patient is here for wounds on his bilateral lower legs in the setting of severe lymphedema 03/26/2022; patient is here for wounds on his bilateral lower legs in the setting of severe lymphedema Electronic Signature(s) Signed: 06/02/2023 3:39:41 PM By: Rosan Harlene DO Entered By: Rosan Harlene on 06/02/2023 15:35:28 -------------------------------------------------------------------------------- HPI Details Patient Name: Date of Service: [name], [name] 06/02/2023 2:45 PM Medical Record Number: [id number] Patient Account Number: [id number] Date of Birth/Sex: Treating RN: 1945/07/05 (78 y.o. M) Primary Care Provider: Claudene Salena RAMAN Other Clinician: Referring Provider: Treating Provider/Extender: Rosan Harlene Claudene Salena RAMAN Weeks in Treatment: 20 History of Present Illness HPI Description: ADMISSION 02/23/2020 Patient is a 78 year old man who lives in [place] who arrives accompanied by his wife. He has a history of chronic lymphedema and venous insufficiency in his bilateral lower legs which may have something to do that with having a history of DVT as well as being treated for prostate cancer. In any case he recently got compression pumps at home but compliance has been an issue here. He has compression stockings however they are probably not sufficient enough to control swelling. They tell us  that things deteriorated for him  in late August he was admitted to [facility] for 7 days. This was with cellulitis I think of his bilateral lower legs. Discharge he was noted to have wounds on his bilateral lower legs. He was discharged on Bactrim. They tried to get him home health through [employer] Medicare part C of course they declined him. His wife is been wrapping these applying some form of silver foam dressing. He has a history of wounds before although nothing that would not heal with basic home topical dressings. He has 2 areas on the left medial, left anterior and left lateral and a smaller area on the right medial. All of these have considerable depth. Past medical history includes iron deficiency anemia, lymphedema followed by the rehab center at [facility] with lymphedema wraps I believe, DVT on chronic anticoagulation, prostate cancer, chronic venous insufficiency, hypertension. As mentioned he has compression pumps but does not use them. ABIs in our clinic were noncompressible bilaterally 10/14; patient with severe bilateral lymphedema right greater than left. He came in with bilateral lower extremity wounds left greater than right. Even though the right side has more of the edema most of the wounds here almost closed on the right medial. He has 3 remaining wounds on the left We have been using silver alginate under 4-layer compression I have been trying to get him to be compliant with his external compression pumps 10/21; patient with 3 small wounds on the left leg and 1 on the right medial in the setting of severe lymphedema and chronic venous insufficiency. We have been using silver alginate under 4-layer compression he is using his external compression pumps twice a day 11/4; ARTERIAL STUDIES on the right show an ABI of 1.02 TBI of 0.858 with biphasic waveforms on the left 0.98 with a TBI of 0.55 and biphasic waveforms. Does not look like he has significant arterial disease. We are treating  him for  lymphedema he has compression pumps. He has punched-out areas on the left anterior left lateral and right medial lower extremities 11/11; after we obtained his arterial studies I put him in 4 layer compression. He is using his compression pumps probably once a day although I have asked Laureano, Kydan ([id number]) [id number] Page 2 of 14 him to do twice. Primary dressing to the wound is silver collagen he has severe lymphedema likely secondary to chronic venous insufficiency. Wounds on the left lateral, left medial and left anterior and a small area on the right medial 12/2; the area on the right anterior lower leg has healed. We initially thought that the area medially had healed as well however when her discharge nurse came in she detected fluid in the wound simply opened up. This is actually worse than I remember this pain. The area on the left lateral potentially slightly smaller He is also complaining about pain in his left hand he says that this is actually been getting some better he has been using topical creams on this. She asked that I look at this 12/9 after last weeks issues we have 2 wounds one on the right medial lower leg and 1 on the left lateral. Both of these are in the same condition. I think because of thickened skin secondary to chronic lymphedema these wounds actually have depth of almost 0.8 cm. 12/16; the patient has 2 small but deep wounds one on the right medial and one on the left lateral. The right medial is actually the worst of these. He arrives in clinic today with absolutely terrible edema in the right leg apparently his 4-layer wrap fell down to just above his ankle he did not think about this he is apparently been continuing to use his compression pump twice a day. The left leg looks a lot better. 05/09/2020 upon evaluation today patient appears to be doing decently well in regard to his wounds. Everything is measuring smaller the right leg  still has a little bit deeper wound in the left seems to be almost completely healed in my opinion I am very pleased in general with how things are progressing. He has a 4- layer compression wrap we have been using endoform today we will probably have to use collagen just based on the fact that we do not have endoform it is on order. 1/6; the patient's wound on the left lateral lower leg has healed. Still has 1 on the right medial. 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 Using compression pumps at home twice a day. 1/13; left lateral lower leg is still healed. He has a deep punched out rectangular shaped wound on the right medial calf. Looking down at this it appears that he is attempting to epithelialize around the edges of the wound and on the base as well. His edema is reasonably well controlled we have been using collagen with absolutely no effect 1/20; left lateral lower leg remains closed he has extremitease stockings. The area on the right medial calf I aggressively debrided last week measures larger but the surface looks better. We have been using Hydrofera Blue. We ran Oasis through his insurance but we have not seen the results of this 1/27; left lower leg wound with chronic venous insufficiency and secondary lymphedema. I did aggressive debridement on this last week the wound seems to have come in healthy looking surface using Hydrofera Blue. He was denied for Oasis 2/3; small divot in the right medial  lower leg. Under illumination the walls of this divot are epithelialized however the base has slough which I removed with a curette we have been using Hydrofera Blue 2/10 small divot on the right medial lower leg pinpoint illumination at the base of this cone-shaped wound. We have been using Hydrofera Blue but I will switch to calcium alginate this week 2/17; the small divot on the right medial lower leg is fully epithelialized. There is no visible open area under  illumination. He has his own stocking for the right leg similar to the one he has been wearing on the left. 03/26/2022; READMISSION This is a now 78 year old man that we had in the clinic from 02/23/2020 through 07/05/2020. At that point he had bilateral lower extremity wounds left greater than right in the setting of severe lymphedema. He had already obtained compression pumps ordered for him I think from the wound care clinic in [place]  so I do not really have record of what he has been using. He claims to be using them once a day but there is a problem with the sleeve on the left leg. About 2 weeks ago he was hospitalized from 03/11/2022 through 03/14/2022 with diastolic congestive heart failure. 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 He was diuresed. Developed some prerenal azotemia and he has not been taking any diuretics currently. He has not been putting stockings on his legs since he got out of hospital and still has his legs dependent for long periods. Past medical history history of prostate cancer treated with prostatectomy and radiation this was apparently about 8 years ago, history of DVT on chronic Coumadin , history of lymphedema was managed for a while at the clinic in [place]. History of inguinal hernia repair in September 22, hypertension, stage IIIb chronic renal failure ABIs today were noncompressible on the right 1.12 on the left 04-02-2022 upon evaluation today patient appears to be doing well currently in regard to his legs I do feel like both areas that are draining are actually much drier than they were in the picture last week although the left is drier than the right. He is tolerating the 4-layer compression wraps at this point he did contact the pump company and they are actually working on getting him a new compression sleeve for one of his legs which have previously popped and was not functioning  properly. 04-23-2022 upon evaluation today patient appears to be doing well currently in regard to his wounds on the legs. I am actually very pleased with where things stand and I do feel like that we are headed in the right direction. Fortunately there is no sign of active infection locally or systemically at this time. 05-07-2022 upon evaluation today patient appears to be doing well currently in regard to his wounds in fact things are showing signs of improvement which is good news I do not see too much that actually appears to be open and I am very pleased in that regard. No fevers, chills, nausea, vomiting, or diarrhea. 05-21-2022 upon evaluation today patient appears to be doing somewhat poorly in regard to drainage of his lower extremities bilaterally. The right is greater than left as far as the weeping area. Nonetheless it seems to be getting worse not better. He actually has pitting edema which is at least 2+ to the thighs and I am concerned about the fact that he is may be fluid overloaded in general and that is the  reason why we cannot get this under control. I know he is not using his pumps all the time because he actually told the nurse that he was either going to pump or he was going to use his fluid pills but not do both. For that reason I do think that he needs to be really doing both in order to get the fluid out as effectively as possible obviously with the 4-layer compression wraps were doing as much as we can from a compression standpoint but it is really not enough. He tells me that he elevates his leg is much as he can in between pumping and other activity throughout the day. 05-28-2022 upon evaluation today patient appears to be doing better in regard to his wounds although the measurements may be a little bit larger this is a very difficult wound to heal it is very indistinct in a lot of areas. Nonetheless there is can be some need for sharp debridement in regard to both medial and  lateral legs. Fortunately I see no signs of active infection locally nor systemically at this time. No fevers, chills, nausea, vomiting, or diarrhea. 06-04-2022 upon evaluation today patient appears to be doing poorly in general in regard to the wounds on his legs. He still continues to have a tremendous amount of fluid not just in the lower portion of his leg but to be honest his thigh where he has 2-3+ pitting edema in the thigh as well. Unfortunately I do not know that we will be able to get this healed effectively and keep it healed on the lower extremities unless he gets the overall fluid situation taking and under control. Fortunately I do not see any signs of infection locally nor systemically which is great news. He just seems to be very fluid overloaded. 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 He has been tolerating the dressing changes with the compression wraps without complication. Fortunately I do not see any evidence of infection at this time which is great news. No fevers, chills, nausea, vomiting, or diarrhea. 06-18-2022 upon evaluation today patient appears to be doing well currently in regard to his wounds as far as not looking like they are terribly infected but nonetheless I am concerned about a subacute infection secondary to the fact that he continues to have spreading despite the compression therapy. We actually did do an Unna boot on him last week this is actually the first wrap that actually stayed up everything else has been sliding down quite significantly. Fortunately there does not appear to be any signs of infection systemically at this time. With that being said I do believe that locally there seems to be an issue going on here and again I Dimas do a PCR culture to see what that shows also think that I am going to put him on a broad-spectrum  antibiotic, doxycycline to see how that will help as well. He does tell me that coming into the clinic today that he was feeling short of breath like he was about to have a heart attack because he was having such a hard time breathing. He says that he told this to Dr. Graeme his cardiologist as well when he was evaluated in the [place], [facility] ([id number]) [phone].pdf Page 3 of 14 past 1 to 2 weeks. 07-02-2022 upon evaluation today patient appears to be doing poorly currently in regard to his wound. He has been tolerating the  dressing changes. Unfortunately he has not had any compression wraps on for the past week because he was unable to make it in for his appointment last week. With that being said he has a significant amount of drainage he tells me has been using his pumps but despite this in the pumps he still has been draining quite a bit. The drainage is also somewhat purulent unfortunately. We did attempt to get in touch with his cardiologist last week unfortunately we were unable to get up with him I did advise that the patient needs to get in touch with him upon leaving today in order to make sure they know he is on the new antibiotics I am going to send him this will be Levaquin and Augmentin. 07-09-2022 upon evaluation today patient appears to be doing about the same in regard to his legs he may have just a slight amount of improvement with regard to the drainage probably Keystone topical antibiotics are helping in this regard to some degree. Fortunately there does not appear to be any signs of active infection systemically which is great news. No fevers, chills, nausea, vomiting, or diarrhea. 07-16-2022 upon evaluation today patient appears to be doing well currently in regard to his wound. He has been tolerating the dressing changes without complication. Fortunately there does not appear to be any signs of active infection locally nor systemically at this time.  With that being said he cannot keep the wraps up he tells me on the left side he had to cut this down because it got too tight. He has been using his pumps but he is on the right side the wrap actually straight down causing some pushing around the central part of his leg just below the calf I think this is a bigger risk for him that help at this point. I think that we may need to try something different he should be getting his compression socks shortly he tells me they were ordered last Thursday. 3/6; ; this is a patient who lives in [place]. He has severe bilateral lymphedema. He has compression pumps, we have been using kerlix Ace wrap Keystone. He is changing the dressing. We do not have home health. 08-06-2022 upon evaluation today patient appears to be doing a little better in regard to his wounds in general at this point. Fortunately there does not appear to be any signs of active infection locally nor systemically at this time which is great news and overall I am extremely pleased with where we stand today. 08-13-2022 upon evaluation today patient appears to actually be doing significantly better compared to last week. He actually did go to the hospital I told him that he needed to when he left here and he actually did go. With that being said they actually ended up admitting him he was having shortness of breath and I thought it might be related to congestive heart failure turns out he actually had a pulmonary embolism. Subsequently they were able to get him off of the Coumadin  switching over to Eliquis  to get things stabilized in that regard they also had them wrapped and got his swelling under control on his legs he actually looks much better pretty much across the board at this point. I am very pleased in that regard. With that being said I am very happy that he finally went that could have been a very dangerous situation. 08-20-2022 upon evaluation today patient appears to be doing well  currently in regard to his  wound. Has been tolerating the dressing changes without complication. Fortunately there does not appear to be any signs of active infection locally nor systemically at this time. I think his legs are doing better there is some need for sharp debridement today. 08-27-2022 upon evaluation patient is actually making excellent progress. I am actually very pleased with where he stands and I think that he is moving in the right direction. In general I think that we are looking pretty good at the moment. 09-03-2022 upon evaluation today patient appears to be doing well currently in regard to his wound. He is actually tolerating dressing changes on the left and right leg without complication. Fortunately I do not see any need for debridement of the left leg the right leg I think we probably do some need to perform some debridement here. 09-10-2022 upon evaluation today patient's wounds actually showed signs of improvement in both legs I do not see much is going require debridement today which was great news. Fortunately I do not see any evidence of infection which I think is also excellent he seems to be using his pumps and doing everything right I am happy about how this is progressing at this point. 09-17-2022 upon evaluation today patient appears to be doing decently well in regard to his wounds. He has been tolerating the dressing changes without complication. Fortunately there does not appear to be any signs of active infection at this time which is good news. 09-24-2022 upon evaluation today patient appears to be doing well currently in regard to his wounds. He has been tolerating the dressing changes without complication. Fortunately there does not appear to be any signs of active infection locally nor systemically which is great news. No fevers, chills, nausea, vomiting, or diarrhea. 10-08-2022 upon evaluation today patient appears to be doing excellent currently in regard to his  wound. He has been tolerating the dressing changes without complication though it does not sound like he has been using his compression wraps for a bit here. He does think he was doing better with the [facility] topical antibiotics we can definitely go back to using that but I think the biggest issue here is that his swelling is just very out of control and needs to be under control. I discussed that with him today. 10-15-2022 upon evaluation today patient appears to be doing well currently in regard to his wounds. He is actually making some progress which is good news. Fortunately I do not see any evidence of active infection locally nor systemically which is great news as well. No fevers, chills, nausea, vomiting, or diarrhea. He does have a callused area on the plantar aspect of his left foot which is actually causing some pain and he wonders if I can trim this down for him. 10-22-2022 upon evaluation today patient seems to be making progress. He is actually doing quite well and very pleased in that regard. I do not see any signs of active infection at this time. 11-05-2022 upon evaluation patient appears to be making progress although slowly towards closure. He seems to be doing well with regard to his legs were still using the [facility] topical antibiotics and he seems to be doing quite well. He is going require some sharp debridement today. 11-19-2022 upon evaluation today patient unfortunately appears to be extremely swollen at this point. He tells me that he ran out of supplies he also tells me his leg started leaking more because of not having supplies he was unable to wear  his compression wraps that is the juxta fit compression wraps. Therefore his legs are extremely swollen much larger than normal and do not appear to be doing well at all today. He is going require some debridement I also think he is going require us  to perform compression wrapping today. 11-26-2022 upon evaluation today patient  appears to be doing well currently in regard to his legs as far as infection is concerned I see nothing that appears to be infected. Fortunately I do not see any signs of active infection systemically either which is also good news. With that being said he still is extremely swollen as far as his legs are concerned. I do not see any signs of overall worsening but also do not see any signs of significant improvement which is the major issue here. 12-03-2022 upon evaluation today patient appears to be doing poorly still in regard to his legs the apparently has been taken off the wraps and not leaving the week by week. With that being said he has not had really the dressings to put on the release that is running out and subsequently though he is using his wraps I am not sure that he has been keeping them on like he needs to. I explained to him I really wanted to have the wraps that I put on him left on until he comes back to see me so that we can keep the compression under better control. He voiced understanding today he tells me that he was confused about that. Nonetheless based on what I am seeing right now also think that he is up on his feet too much I think he needs to get the feet elevated. 7/24; this is a patient with severe bilateral stage III lymphedema. On the right lower leg medially is a large area of macerated denuded skin was too small deeper areas in this. On the left leg he is 2 areas that are a little more standard in terms of wounds. Massive lymphedema bilaterally. He has compression pumps at home which he uses for 1 hour twice a day 12-17-2022 upon evaluation today patient's legs though a little bit smaller still continue to have significant issues with weeping here this just does not dry. I really think he needs to be changing this daily and this means we will probably have to try to go back to the Velcro wraps and give this a shot. I think may be going to the Velcro wraps, changing  the dressing to a superabsorber like XtraSorb or the like, and have him elevate his legs and do his pumps 3 times per day is [name], [name] ([id number]) [id number] Page 4 of 14 probably going to be the way to go to try to see if we can make some improvement here. 12-24-2022 upon evaluation today patient unfortunately is still struggling to get anything under control. We have been extremely aggressive and trying to: Control his swelling everything we do however seems to be met with resistance. He tells me that he is pumping 3 times per day with his lymphedema pumps. He also tells me that he is try to elevate his legs is much as possible and that subsequently he has been keeping his compression wraps on. He goes back and forth between saying that he can keep our wraps on and not but right now I really do not see that he is doing well with the juxta fit is at least not from the standpoint of improving the overall appearance I  think it does to some degree help maintain but we are still struggling here. I have voiced a concern to the patient that despite everything you are doing I am still not seeing a lot of improvement here and to be honest I am not sure what is can take get this under control he may have to go into the hospital for admission. 12-31-2022 upon evaluation today patient appears to be doing well currently in regard to his wounds. They seem a little better although he has several pustules around the legs in general but has been concerned about infection. I am actually going to go ahead and see about getting an antibiotic sent into the pharmacy today although I am going to obtain a PCR culture as well. 01-07-2023 upon evaluation today patient appears to be doing well currently in regard to his wound. Has been tolerating the dressing changes without complication. He actually seems to be doing much better. He did have a PCR culture which showed Serratia as well and the  prominent organisms here. Levaquin was actually a good option to treat the Serratia along with the other organisms that were problematic including E. coli. Nonetheless I think coupled with the topical Keystone antibiotics and the alginate he is really doing quite well today. 8/28; patient with severe bilateral lymphedema he has wounds bilaterally on his lower legs but they look a lot better today. We are using silver alginate and Keystone with Urgo K2 lite wraps. He comes in today with his wounds looking quite good. He was prescribed Levaquin last time I think he will complete this in a few days. 01-21-2023 upon evaluation today patient appears to be doing worse in regard to his swelling took his wrap off he tells me he believes on Saturday. With that being said his leg is significantly swollen since that time and unfortunately does not appear to be doing nearly as good as it was last time I saw him 2 weeks ago or even last week for that matter. I am very concerned about this. 01-28-2023 upon evaluation today patient appears to be doing poorly currently in regard to his legs. He has been doing everything he says that we have recommended, he continues to use his lymphedema pumps 3 times a day, he has been doubling up on his fluid pills, he has been elevating his legs, and he tells me that he also has kept the wraps on and in fact he did have them on the day when he came in. Again that for feels the compression side of things. Nonetheless with everything going on here we still have not been able to get this completely under control. He continues to have issues with significant swelling of lower extremities and this is not limited to just his lower left portion of his legs but his thighs are fairly large as well. With that being said I really feel like that we have reached the extent of what we have to offer here at the wound care center I am wondering if we can see about making a referral for  second opinion to [facility] to see if there is anything they could do to help him out at this point. It is really not much further from his home to there as it is from his home to here. Both are about an hour distance. He lives in [place] . 02-04-2023 upon evaluation today patient appears to be doing much better actually compared to last week's evaluation. This is actually  significant improvement and overall I do not know exactly what he did different other than he tells me that he has been taking his fluid pills every day instead of every other day. Obviously this is needed. I was under the understanding previously that he was taking this every day but I think he may also not been pumping every day like he was telling me but I cannot be sure this either way he does seem to be doing what he supposed to do at this point. 02-11-2023 upon evaluation today patient unfortunately appears to be about as bad today as he appeared good last week. I am not sure what happened other than the fact he did take his wrap off the right leg he tells me it got so tight that he could not feel his toes they are getting very numb. Subsequently he tells me that it was difficulty with driving therefore he took it off. With that being said I do believe that he may need to go to the ER for evaluation and treatment I feel he may need to be admitted to get some of the fluid off of his legs. I think that he is volume overloaded and at this point I do not think there is much working to be able to do to manage this outpatient we have tried pretty much everything that I am going to do. 02-18-23 upon evaluation today patient appears to be doing pretty well currently compared to last week this is actually better today. He is less weeping and the legs are actually significantly smaller. He tells me has been trying to stay in bed with his legs elevated more. 02-25-2023 upon evaluation today patient appears to be doing well  currently in regard to his legs in general compared to where we have been. Fortunately I do not see any signs of active infection locally or systemically which is great news and in general I do believe that we are making good headway here towards complete closure. I am hopeful that he will continue to show signs of improvement as we progress going forward. He does seem to have been taking his fluid pills as well as elevating his legs and using lymphedema pumps on a regular basis. He is trying to stay out of the hospital. 03-04-2023 upon evaluation today patient appears to be doing well currently in regard to his wound. He has been tolerating the dressing changes without complication. Fortunately there does not appear to be any signs of active infection at this time. 03-11-2023 upon evaluation today patient appears to be doing well currently in regard to his wounds. He has been tolerating the dressing changes without complication. Fortunately I do not see any signs of active infection at this time which is great news. 11/6; patient missed his clinic appointment last week. He took his wraps off at some point. Did not take his diuretics because he was nauseated last week. He comes in with massive bilateral edema which is lymphedema and probably some degree of systemic fluid overload. 11/13; patient with very poorly controlled lymphedema. We have been putting 4-layer equivalent wraps on him he says he is using his compression pumps twice a day. And elevating his legs nevertheless the edema control today is very poor. He has a substantial wound on the right medial lower leg also of the left medial and left posterior. We have been using alginate as a primary dressing or Aquacel Ag 11/20; some improvement in the amount of edema. We have  been using 4-layer equivalent wraps and he is pumping with his external compression pumps twice a day. His legs had improved in terms of edema although there is still  massively edematous. He tells me that he is on torsemide  1-1/2 tablets 20 mg tabs a day 12/4; massive edema extending up into his groin. Interestingly his edema in the thighs is more pitting then nonpitting lymphedema. His wounds are on both legs. This is not something that I think we can debride although I did do this last week I am not sure this did him any good. We have been using 4-layer compression he is using his pumps twice a day. I misunderstood his torsemide  dosage which apparently is 1 tablet alternating with a half a tablet daily. I think he probably needs more than that. Unfortunately he cannot get a hold of his cardiologist... 12/11 massive edema in the bilateral lower legs extending up into his posterior thighs towards his groin. He tells me he took his compression wrap off on Sunday. He says he is using his external compression pumps twice a day. He has wounds predominantly on the right medial leg left lateral left anterior and left medial leg. The patient states he has tried to contact his cardiologist vis--vis diuretic use and he just cannot get a hold of anybody in the office 12/18; massive edema in his bilateral lower legs extending up into his groin. He has an elevated jugular venous pressure. He tells me his torsemide  was increased to 30 mg he takes 130 mg tablet. He has wounds on the right medial left medial left anterior and left lateral lower legs they are covered in a nonviable surface but with this amount of edema debridement just is not indicated. He says he is using his compression pumps 3 times a day he had to cut down the wraps because they were too loose. 05/27/2023; patient presents for follow-up. He has not been here in 3 weeks. He states he is using his lymphedema pumps. He is using silver alginate to the wound beds under his Velcro compression wraps. 06/02/2023; patient presents for follow-up. At last clinic visit we restarted the compression wraps. We have been using  antibiotic ointment and antifungal cream Reinheimer, [name] ([id number]) [id number] Page 5 of 14 with silver alginate under 4-layer compression. He states he has had Keystone antibiotic ointment in the past and this has done well for him. He states he has been using his lymphedema pumps. Wounds have improved. Electronic Signature(s) Signed: 06/02/2023 3:39:41 PM By: Rosan Raisin DO Entered By: Rosan Raisin on 06/02/2023 15:36:14 -------------------------------------------------------------------------------- Physical Exam Details Patient Name: Date of Service: [name], [name] 06/02/2023 2:45 PM Medical Record Number: [id number] Patient Account Number: [id number] Date of Birth/Sex: Treating RN: 1945/12/26 (77 y.o. M) Primary Care Provider: Claudene Salena RAMAN Other Clinician: Referring Provider: Treating Provider/Extender: Rosan Raisin Claudene Salena RAMAN Weeks in Treatment: 68 Constitutional respirations regular, non-labored and within target range for patient.. Cardiovascular 2+ dorsalis pedis/posterior tibialis pulses. Psychiatric pleasant and cooperative. Notes Scattered open wounds to the lower extremities bilaterally with non viable tissue. Stage IV lymphedema. No obvious signs of overt soft tissue infection. Electronic Signature(s) Signed: 06/02/2023 3:39:41 PM By: Rosan Raisin DO Entered By: Rosan Raisin on 06/02/2023 15:37:04 -------------------------------------------------------------------------------- Physician Orders Details Patient Name: Date of Service: [name], [name] 06/02/2023 2:45 PM Medical Record Number: [id number] Patient Account Number: [id number] Date of Birth/Sex: Treating RN: [birth year] (78 y.o. NETTY Lanis Maxwell Primary Care Provider: Claudene Salena RAMAN Other Clinician: Referring Provider:  Treating Provider/Extender: Rosan Harlene Claudene Salena GORMAN Weeks in Treatment: 66 Verbal / Phone Orders: No Diagnosis  Coding Follow-up Appointments ppointment in 1 week. - +++ Extra Time++++Dr. Rosan Tuesday Return A Other: - take your fluid pills as prescribed. Speak with cardiologist about increasing fluid pills T wraps off in 1 wk, dress wounds yourself, wear stockings, continue to pump 3x day ake Anesthetic (In clinic) Topical Lidocaine  4% applied to wound bed Bathing/ Shower/ Hygiene May shower with protection but do not get wound dressing(s) wet. Protect dressing(s) with water repellant cover (for example, large plastic bag) or a cast cover and may then take shower. Edema Control - Orders / Instructions Segmental Compressive Device. Use the Segmental Compressive Device on leg(s) 2-3 times a day for 45 - 60 minutes. If wearing any wraps [name], [name] ([id number]) [id number] Page 6 of 14 or hose, do not remove them. Continue exercising as instructed. - x3 a day Elevate legs to the level of the heart or above for 30 minutes daily and/or when sitting for 3-4 times a day throughout the day. Avoid standing for long periods of time. Exercise regularly Off-Loading Other: - When sitting please keep your feet up, keep legs at Heart Level or above. Use pillows behind calf to for comfort. Additional Orders / Instructions Follow Nutritious Diet - Try and increase protein intake. Goal of protein intake is 60g-100g. Lymphedema Treatment Plan - Exercise, Compression and Elevation Bilateral Lower Extremities Exercise daily as tolerated. (Walking, ROM, Calf Pumps and Toe Taps) Elevate legs 30 - 60 minutes at or above heart level at least 3 - 4 times daily as able/tolerated Avoid standing for long periods and elevate leg(s) parallel to the floor when sitting Use Pneumatic Compression Device on leg(s) 2-3 times a day for 45-60 minutes - x3 a day Wound Treatment Wound #17 - Lower Leg Wound Laterality: Right, Anterior Cleanser: Soap and Water Every Other Day/30 Days Discharge  Instructions: May shower and wash wound with dial antibacterial soap and water prior to dressing change. Peri-Wound Care: Sween Lotion (Moisturizing lotion) Every Other Day/30 Days Discharge Instructions: Apply moisturizing lotion as directed Topical: Gentamicin Every Other Day/30 Days Discharge Instructions: As directed by physician Topical: Mupirocin Ointment Every Other Day/30 Days Discharge Instructions: Apply Mupirocin (Bactroban) as instructed Topical: Ketoconazole Cream 2% Every Other Day/30 Days Discharge Instructions: Apply Ketoconazole as directed Prim Dressing: Maxorb Extra CMC/Alginate Dressing, 4x4 (in/in) (Generic) Every Other Day/30 Days ary Discharge Instructions: Apply to wound bed as instructed Secondary Dressing: ABD Pad, 8x10 Every Other Day/30 Days Discharge Instructions: Apply over primary dressing as directed. Secondary Dressing: Zetuvit Plus 4x8 in Every Other Day/30 Days Discharge Instructions: Apply over primary dressing as directed. Secondary Dressing: OptiLock Super Absorbent, 5x5.5 (in/in) (Generic) Every Other Day/30 Days Discharge Instructions: Apply directly to wound bed as directed Compression Wrap: Urgo K2, (equivalent to a 4 layer) two layer compression system, regular Every Other Day/30 Days Discharge Instructions: Apply Urgo K2 as directed (alternative to 4 layer compression). Wound #6 - Lower Leg Wound Laterality: Right, Medial Cleanser: Soap and Water Every Other Day/30 Days Discharge Instructions: May shower and wash wound with dial antibacterial soap and water prior to dressing change. Peri-Wound Care: Sween Lotion (Moisturizing lotion) Every Other Day/30 Days Discharge Instructions: Apply moisturizing lotion as directed Topical: Gentamicin Every Other Day/30 Days Discharge Instructions: As directed by physician Topical: Mupirocin Ointment Every Other Day/30 Days Discharge Instructions: Apply Mupirocin (Bactroban) as instructed Topical:  Ketoconazole Cream 2% Every Other Day/30 Days Discharge Instructions:  Apply Ketoconazole as directed Prim Dressing: Maxorb Extra CMC/Alginate Dressing, 4x4 (in/in) (Generic) Every Other Day/30 Days ary Discharge Instructions: Apply to wound bed as instructed Secondary Dressing: ABD Pad, 8x10 Every Other Day/30 Days Discharge Instructions: Apply over primary dressing as directed. Secondary Dressing: Zetuvit Plus 4x8 in Every Other Day/30 Days Discharge Instructions: Apply over primary dressing as directed. Secondary Dressing: OptiLock Super Absorbent, 5x5.5 (in/in) (Generic) Every Other Day/30 Days Discharge Instructions: Apply directly to wound bed as directed Compression Wrap: Urgo K2, (equivalent to a 4 layer) two layer compression system, regular Every Other Day/30 Days Jafari, Keaun ([id number]) [id number] Page 7 of 14 Discharge Instructions: Apply Urgo K2 as directed (alternative to 4 layer compression). Wound #7 - Lower Leg Wound Laterality: Left, Circumferential Cleanser: Soap and Water Every Other Day/30 Days Discharge Instructions: May shower and wash wound with dial antibacterial soap and water prior to dressing change. Peri-Wound Care: Sween Lotion (Moisturizing lotion) Every Other Day/30 Days Discharge Instructions: Apply moisturizing lotion as directed Topical: Gentamicin Every Other Day/30 Days Discharge Instructions: As directed by physician Topical: Mupirocin Ointment Every Other Day/30 Days Discharge Instructions: Apply Mupirocin (Bactroban) as instructed Topical: Ketoconazole Cream 2% Every Other Day/30 Days Discharge Instructions: Apply Ketoconazole as directed Prim Dressing: Maxorb Extra CMC/Alginate Dressing, 4x4 (in/in) (Generic) Every Other Day/30 Days ary Discharge Instructions: Apply to wound bed as instructed Secondary Dressing: ABD Pad, 8x10 Every Other Day/30 Days Discharge Instructions: Apply over primary dressing as  directed. Secondary Dressing: Zetuvit Plus 4x8 in Every Other Day/30 Days Discharge Instructions: Apply over primary dressing as directed. Secondary Dressing: OptiLock Super Absorbent, 5x5.5 (in/in) (Generic) Every Other Day/30 Days Discharge Instructions: Apply directly to wound bed as directed Compression Wrap: Urgo K2, (equivalent to a 4 layer) two layer compression system, regular Every Other Day/30 Days Discharge Instructions: Apply Urgo K2 as directed (alternative to 4 layer compression). Laboratory naerobe culture (MICRO) - PCR-vikor Bacteria identified in Unspecified specimen by A LOINC Code: 635-3 Convenience Name: Anaerobic culture Electronic Signature(s) Signed: 06/02/2023 3:39:41 PM By: Rosan Raisin DO Previous Signature: 06/02/2023 3:10:48 PM Version By: Lanis Maxwell RN Entered By: Rosan Raisin on 06/02/2023 15:37:14 -------------------------------------------------------------------------------- Problem List Details Patient Name: Date of Service: Bradway, [name] 06/02/2023 2:45 PM Medical Record Number: [id number] Patient Account Number: [id number] Date of Birth/Sex: Treating RN: Feb 21, 1946 (78 y.o. 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) with ulcer and inflammation of 06/11/2022 No Yes bilateral lower extremity I89.0 Lymphedema, not elsewhere classified 03/26/2022 No Yes [name], [name] ([id number]) [id number] Page 8 of 14 [phone] Non-pressure chronic ulcer of other part of left lower leg with other specified 03/26/2022 No Yes severity L97.818 Non-pressure chronic ulcer of other part of right lower leg with other specified 03/26/2022 No Yes severity I50.42 Chronic combined systolic  (congestive) and diastolic (congestive) heart failure 05/06/2023 No Yes Inactive Problems ICD-10 Code Description Active Date Inactive Date L84 Corns and callosities 10/15/2022 10/15/2022 Resolved Problems Electronic Signature(s) Signed: 06/02/2023 3:39:41 PM By: Rosan Raisin DO Entered By: Rosan Raisin on 06/02/2023 15:35:15 -------------------------------------------------------------------------------- Progress Note Details Patient Name: Date of Service: [name], [name] 06/02/2023 2:45 PM Medical Record Number: [id number] Patient Account Number: [id number] Date of Birth/Sex: Treating RN: 02/26/1946 (78 y.o. M) Primary Care Provider: Claudene Salena RAMAN Other Clinician: Referring Provider: Treating Provider/Extender: Rosan Raisin Claudene Salena RAMAN Weeks in Treatment: 75 Subjective  Chief Complaint Information obtained from Patient 02/23/2020; patient is here for wounds on his bilateral lower legs in the setting of severe lymphedema 03/26/2022; patient is here for wounds on his bilateral lower legs in the setting of severe lymphedema History of Present Illness (HPI) ADMISSION 02/23/2020 Patient is a 78 year old man who lives in [place] who arrives accompanied by his wife. He has a history of chronic lymphedema and venous insufficiency in his bilateral lower legs which may have something to do that with having a history of DVT as well as being treated for prostate cancer. In any case he recently got compression pumps at home but compliance has been an issue here. He has compression stockings however they are probably not sufficient enough to control swelling. They tell us  that things deteriorated for him in late August he was admitted to [facility] for 7 days. This was with cellulitis I think of his bilateral lower legs. Discharge he was noted to have wounds on his bilateral lower legs. He was discharged on Bactrim. They tried to get him home health through [employer]  Medicare part C of course they declined him. His wife is been wrapping these applying some form of silver foam dressing. He has a history of wounds before although nothing that would not heal with basic home topical dressings. He has 2 areas on the left medial, left anterior and left lateral and a smaller area on the right medial. All of these have considerable depth. Past medical history includes iron deficiency anemia, lymphedema followed by the rehab center at [facility] with lymphedema wraps I believe, DVT on chronic anticoagulation, prostate cancer, chronic venous insufficiency, hypertension. As mentioned he has compression pumps but does not use them. ABIs in our clinic were noncompressible bilaterally 10/14; patient with severe bilateral lymphedema right greater than left. He came in with bilateral lower extremity wounds left greater than right. Even though the right side has more of the edema most of the wounds here almost closed on the right medial. He has 3 remaining wounds on the left [name], [name] ([id number]) [id number] Page 9 of 14 We have been using silver alginate under 4-layer compression I have been trying to get him to be compliant with his external compression pumps 10/21; patient with 3 small wounds on the left leg and 1 on the right medial in the setting of severe lymphedema and chronic venous insufficiency. We have been using silver alginate under 4-layer compression he is using his external compression pumps twice a day 11/4; ARTERIAL STUDIES on the right show an ABI of 1.02 TBI of 0.858 with biphasic waveforms on the left 0.98 with a TBI of 0.55 and biphasic waveforms. Does not look like he has significant arterial disease. We are treating him for lymphedema he has compression pumps. He has punched-out areas on the left anterior left lateral and right medial lower extremities 11/11; after we obtained his arterial studies I put him in 4 layer  compression. He is using his compression pumps probably once a day although I have asked him to do twice. Primary dressing to the wound is silver collagen he has severe lymphedema likely secondary to chronic venous insufficiency. Wounds on the left lateral, left medial and left anterior and a small area on the right medial 12/2; the area on the right anterior lower leg has healed. We initially thought that the area medially had healed as well however when her discharge nurse came in she detected fluid in the wound simply  opened up. This is actually worse than I remember this pain. The area on the left lateral potentially slightly smaller He is also complaining about pain in his left hand he says that this is actually been getting some better he has been using topical creams on this. She asked that I look at this 12/9 after last weeks issues we have 2 wounds one on the right medial lower leg and 1 on the left lateral. Both of these are in the same condition. I think because of thickened skin secondary to chronic lymphedema these wounds actually have depth of almost 0.8 cm. 12/16; the patient has 2 small but deep wounds one on the right medial and one on the left lateral. The right medial is actually the worst of these. He arrives in clinic today with absolutely terrible edema in the right leg apparently his 4-layer wrap fell down to just above his ankle he did not think about this he is apparently been continuing to use his compression pump twice a day. The left leg looks a lot better. 05/09/2020 upon evaluation today patient appears to be doing decently well in regard to his wounds. Everything is measuring smaller the right leg still has a little bit deeper wound in the left seems to be almost completely healed in my opinion I am very pleased in general with how things are progressing. He has a 4- layer compression wrap we have been using endoform today we will probably have to use collagen just based  on the fact that we do not have endoform it is on order. 1/6; the patient's wound on the left lateral lower leg has healed. Still has 1 on the right medial. 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 Using compression pumps at home twice a day. 1/13; left lateral lower leg is still healed. He has a deep punched out rectangular shaped wound on the right medial calf. Looking down at this it appears that he is attempting to epithelialize around the edges of the wound and on the base as well. His edema is reasonably well controlled we have been using collagen with absolutely no effect 1/20; left lateral lower leg remains closed he has extremitease stockings. The area on the right medial calf I aggressively debrided last week measures larger but the surface looks better. We have been using Hydrofera Blue. We ran Oasis through his insurance but we have not seen the results of this 1/27; left lower leg wound with chronic venous insufficiency and secondary lymphedema. I did aggressive debridement on this last week the wound seems to have come in healthy looking surface using Hydrofera Blue. He was denied for Oasis 2/3; small divot in the right medial lower leg. Under illumination the walls of this divot are epithelialized however the base has slough which I removed with a curette we have been using Hydrofera Blue 2/10 small divot on the right medial lower leg pinpoint illumination at the base of this cone-shaped wound. We have been using Hydrofera Blue but I will switch to calcium alginate this week 2/17; the small divot on the right medial lower leg is fully epithelialized. There is no visible open area under illumination. He has his own stocking for the right leg similar to the one he has been wearing on the left. 03/26/2022; READMISSION This is a now 78 year old man that we had in the clinic from 02/23/2020 through 07/05/2020. At that point he had bilateral lower extremity wounds left  greater than right in  the setting of severe lymphedema. He had already obtained compression pumps ordered for him I think from the wound care clinic in [place]  so I do not really have record of what he has been using. He claims to be using them once a day but there is a problem with the sleeve on the left leg. About 2 weeks ago he was hospitalized from 03/11/2022 through 03/14/2022 with diastolic congestive heart failure. 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 He was diuresed. Developed some prerenal azotemia and he has not been taking any diuretics currently. He has not been putting stockings on his legs since he got out of hospital and still has his legs dependent for long periods. Past medical history history of prostate cancer treated with prostatectomy and radiation this was apparently about 8 years ago, history of DVT on chronic Coumadin , history of lymphedema was managed for a while at the clinic in [place]. History of inguinal hernia repair in September 22, hypertension, stage IIIb chronic renal failure ABIs today were noncompressible on the right 1.12 on the left 04-02-2022 upon evaluation today patient appears to be doing well currently in regard to his legs I do feel like both areas that are draining are actually much drier than they were in the picture last week although the left is drier than the right. He is tolerating the 4-layer compression wraps at this point he did contact the pump company and they are actually working on getting him a new compression sleeve for one of his legs which have previously popped and was not functioning properly. 04-23-2022 upon evaluation today patient appears to be doing well currently in regard to his wounds on the legs. I am actually very pleased with where things stand and I do feel like that we are headed in the right direction. Fortunately there is no sign of active infection locally or  systemically at this time. 05-07-2022 upon evaluation today patient appears to be doing well currently in regard to his wounds in fact things are showing signs of improvement which is good news I do not see too much that actually appears to be open and I am very pleased in that regard. No fevers, chills, nausea, vomiting, or diarrhea. 05-21-2022 upon evaluation today patient appears to be doing somewhat poorly in regard to drainage of his lower extremities bilaterally. The right is greater than left as far as the weeping area. Nonetheless it seems to be getting worse not better. He actually has pitting edema which is at least 2+ to the thighs and I am concerned about the fact that he is may be fluid overloaded in general and that is the reason why we cannot get this under control. I know he is not using his pumps all the time because he actually told the nurse that he was either going to pump or he was going to use his fluid pills but not do both. For that reason I do think that he needs to be really doing both in order to get the fluid out as effectively as possible obviously with the 4-layer compression wraps were doing as much as we can from a compression standpoint but it is really not enough. He tells me that he elevates his leg is much as he can in between pumping and other activity throughout the day. 05-28-2022 upon evaluation today patient appears to be doing better in regard to his wounds although the measurements may be  a little bit larger this is a very difficult wound to heal it is very indistinct in a lot of areas. Nonetheless there is can be some need for sharp debridement in regard to both medial and lateral legs. Fortunately I see no signs of active infection locally nor systemically at this time. No fevers, chills, nausea, vomiting, or diarrhea. 06-04-2022 upon evaluation today patient appears to be doing poorly in general in regard to the wounds on his legs. He still continues to have a  tremendous amount of fluid not just in the lower portion of his leg but to be honest his thigh where he has 2-3+ pitting edema in the thigh as well. Unfortunately I do not know that we will be able to get this healed effectively and keep it healed on the lower extremities unless he gets the overall fluid situation taking and under control. Fortunately I do not see any signs of infection locally nor systemically which is great news. He just seems to be very fluid overloaded. 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 He has been tolerating the dressing changes with the compression wraps without complication. Fortunately I do not see any evidence of infection at this time which is great news. No fevers, chills, nausea, vomiting, or diarrhea. [name], [name] ([id number]) [id number] Page 10 of 14 06-18-2022 upon evaluation today patient appears to be doing well currently in regard to his wounds as far as not looking like they are terribly infected but nonetheless I am concerned about a subacute infection secondary to the fact that he continues to have spreading despite the compression therapy. We actually did do an Unna boot on him last week this is actually the first wrap that actually stayed up everything else has been sliding down quite significantly. Fortunately there does not appear to be any signs of infection systemically at this time. With that being said I do believe that locally there seems to be an issue going on here and again I Dimas do a PCR culture to see what that shows also think that I am going to put him on a broad-spectrum antibiotic, doxycycline to see how that will help as well. He does tell me that coming into the clinic today that he was feeling short of breath like he was about to have a heart attack because he was having such a hard time  breathing. He says that he told this to Dr. Graeme his cardiologist as well when he was evaluated in the past 1 to 2 weeks. 07-02-2022 upon evaluation today patient appears to be doing poorly currently in regard to his wound. He has been tolerating the dressing changes. Unfortunately he has not had any compression wraps on for the past week because he was unable to make it in for his appointment last week. With that being said he has a significant amount of drainage he tells me has been using his pumps but despite this in the pumps he still has been draining quite a bit. The drainage is also somewhat purulent unfortunately. We did attempt to get in touch with his cardiologist last week unfortunately we were unable to get up with him I did advise that the patient needs to get in touch with him upon leaving today in order to make sure they know he is on the new antibiotics I am going to send him this will be Levaquin and Augmentin. 07-09-2022 upon evaluation today patient  appears to be doing about the same in regard to his legs he may have just a slight amount of improvement with regard to the drainage probably Keystone topical antibiotics are helping in this regard to some degree. Fortunately there does not appear to be any signs of active infection systemically which is great news. No fevers, chills, nausea, vomiting, or diarrhea. 07-16-2022 upon evaluation today patient appears to be doing well currently in regard to his wound. He has been tolerating the dressing changes without complication. Fortunately there does not appear to be any signs of active infection locally nor systemically at this time. With that being said he cannot keep the wraps up he tells me on the left side he had to cut this down because it got too tight. He has been using his pumps but he is on the right side the wrap actually straight down causing some pushing around the central part of his leg just below the calf I think this is a  bigger risk for him that help at this point. I think that we may need to try something different he should be getting his compression socks shortly he tells me they were ordered last Thursday. 3/6; ; this is a patient who lives in [place]. He has severe bilateral lymphedema. He has compression pumps, we have been using kerlix Ace wrap Keystone. He is changing the dressing. We do not have home health. 08-06-2022 upon evaluation today patient appears to be doing a little better in regard to his wounds in general at this point. Fortunately there does not appear to be any signs of active infection locally nor systemically at this time which is great news and overall I am extremely pleased with where we stand today. 08-13-2022 upon evaluation today patient appears to actually be doing significantly better compared to last week. He actually did go to the hospital I told him that he needed to when he left here and he actually did go. With that being said they actually ended up admitting him he was having shortness of breath and I thought it might be related to congestive heart failure turns out he actually had a pulmonary embolism. Subsequently they were able to get him off of the Coumadin  switching over to Eliquis  to get things stabilized in that regard they also had them wrapped and got his swelling under control on his legs he actually looks much better pretty much across the board at this point. I am very pleased in that regard. With that being said I am very happy that he finally went that could have been a very dangerous situation. 08-20-2022 upon evaluation today patient appears to be doing well currently in regard to his wound. Has been tolerating the dressing changes without complication. Fortunately there does not appear to be any signs of active infection locally nor systemically at this time. I think his legs are doing better there is some need for sharp debridement today. 08-27-2022 upon evaluation  patient is actually making excellent progress. I am actually very pleased with where he stands and I think that he is moving in the right direction. In general I think that we are looking pretty good at the moment. 09-03-2022 upon evaluation today patient appears to be doing well currently in regard to his wound. He is actually tolerating dressing changes on the left and right leg without complication. Fortunately I do not see any need for debridement of the left leg the right leg I think we probably do  some need to perform some debridement here. 09-10-2022 upon evaluation today patient's wounds actually showed signs of improvement in both legs I do not see much is going require debridement today which was great news. Fortunately I do not see any evidence of infection which I think is also excellent he seems to be using his pumps and doing everything right I am happy about how this is progressing at this point. 09-17-2022 upon evaluation today patient appears to be doing decently well in regard to his wounds. He has been tolerating the dressing changes without complication. Fortunately there does not appear to be any signs of active infection at this time which is good news. 09-24-2022 upon evaluation today patient appears to be doing well currently in regard to his wounds. He has been tolerating the dressing changes without complication. Fortunately there does not appear to be any signs of active infection locally nor systemically which is great news. No fevers, chills, nausea, vomiting, or diarrhea. 10-08-2022 upon evaluation today patient appears to be doing excellent currently in regard to his wound. He has been tolerating the dressing changes without complication though it does not sound like he has been using his compression wraps for a bit here. He does think he was doing better with the [facility] topical antibiotics we can definitely go back to using that but I think the biggest issue here is that his  swelling is just very out of control and needs to be under control. I discussed that with him today. 10-15-2022 upon evaluation today patient appears to be doing well currently in regard to his wounds. He is actually making some progress which is good news. Fortunately I do not see any evidence of active infection locally nor systemically which is great news as well. No fevers, chills, nausea, vomiting, or diarrhea. He does have a callused area on the plantar aspect of his left foot which is actually causing some pain and he wonders if I can trim this down for him. 10-22-2022 upon evaluation today patient seems to be making progress. He is actually doing quite well and very pleased in that regard. I do not see any signs of active infection at this time. 11-05-2022 upon evaluation patient appears to be making progress although slowly towards closure. He seems to be doing well with regard to his legs were still using the [facility] topical antibiotics and he seems to be doing quite well. He is going require some sharp debridement today. 11-19-2022 upon evaluation today patient unfortunately appears to be extremely swollen at this point. He tells me that he ran out of supplies he also tells me his leg started leaking more because of not having supplies he was unable to wear his compression wraps that is the juxta fit compression wraps. Therefore his legs are extremely swollen much larger than normal and do not appear to be doing well at all today. He is going require some debridement I also think he is going require us  to perform compression wrapping today. 11-26-2022 upon evaluation today patient appears to be doing well currently in regard to his legs as far as infection is concerned I see nothing that appears to be infected. Fortunately I do not see any signs of active infection systemically either which is also good news. With that being said he still is extremely swollen as far as his legs are concerned. I  do not see any signs of overall worsening but also do not see any signs of significant improvement which is  the major issue here. 12-03-2022 upon evaluation today patient appears to be doing poorly still in regard to his legs the apparently has been taken off the wraps and not leaving the week by week. With that being said he has not had really the dressings to put on the release that is running out and subsequently though he is using his wraps I am not sure that he has been keeping them on like he needs to. I explained to him I really wanted to have the wraps that I put on him left on until he comes back to see me so that we can keep the compression under better control. He voiced understanding today he tells me that he was confused about that. Nonetheless based on what I am seeing right now also think that he is up on his feet too much I think he needs to get the feet elevated. [name], [name] ([id number]) [id number] Page 82 of 14 7/24; this is a patient with severe bilateral stage III lymphedema. On the right lower leg medially is a large area of macerated denuded skin was too small deeper areas in this. On the left leg he is 2 areas that are a little more standard in terms of wounds. Massive lymphedema bilaterally. He has compression pumps at home which he uses for 1 hour twice a day 12-17-2022 upon evaluation today patient's legs though a little bit smaller still continue to have significant issues with weeping here this just does not dry. I really think he needs to be changing this daily and this means we will probably have to try to go back to the Velcro wraps and give this a shot. I think may be going to the Velcro wraps, changing the dressing to a superabsorber like XtraSorb or the like, and have him elevate his legs and do his pumps 3 times per day is probably going to be the way to go to try to see if we can make some improvement here. 12-24-2022 upon evaluation  today patient unfortunately is still struggling to get anything under control. We have been extremely aggressive and trying to: Control his swelling everything we do however seems to be met with resistance. He tells me that he is pumping 3 times per day with his lymphedema pumps. He also tells me that he is try to elevate his legs is much as possible and that subsequently he has been keeping his compression wraps on. He goes back and forth between saying that he can keep our wraps on and not but right now I really do not see that he is doing well with the juxta fit is at least not from the standpoint of improving the overall appearance I think it does to some degree help maintain but we are still struggling here. I have voiced a concern to the patient that despite everything you are doing I am still not seeing a lot of improvement here and to be honest I am not sure what is can take get this under control he may have to go into the hospital for admission. 12-31-2022 upon evaluation today patient appears to be doing well currently in regard to his wounds. They seem a little better although he has several pustules around the legs in general but has been concerned about infection. I am actually going to go ahead and see about getting an antibiotic sent into the pharmacy today although I am going to obtain a PCR culture as well. 01-07-2023 upon evaluation today patient  appears to be doing well currently in regard to his wound. Has been tolerating the dressing changes without complication. He actually seems to be doing much better. He did have a PCR culture which showed Serratia as well and the prominent organisms here. Levaquin was actually a good option to treat the Serratia along with the other organisms that were problematic including E. coli. Nonetheless I think coupled with the topical Keystone antibiotics and the alginate he is really doing quite well today. 8/28; patient with severe bilateral  lymphedema he has wounds bilaterally on his lower legs but they look a lot better today. We are using silver alginate and Keystone with Urgo K2 lite wraps. He comes in today with his wounds looking quite good. He was prescribed Levaquin last time I think he will complete this in a few days. 01-21-2023 upon evaluation today patient appears to be doing worse in regard to his swelling took his wrap off he tells me he believes on Saturday. With that being said his leg is significantly swollen since that time and unfortunately does not appear to be doing nearly as good as it was last time I saw him 2 weeks ago or even last week for that matter. I am very concerned about this. 01-28-2023 upon evaluation today patient appears to be doing poorly currently in regard to his legs. He has been doing everything he says that we have recommended, he continues to use his lymphedema pumps 3 times a day, he has been doubling up on his fluid pills, he has been elevating his legs, and he tells me that he also has kept the wraps on and in fact he did have them on the day when he came in. Again that for feels the compression side of things. Nonetheless with everything going on here we still have not been able to get this completely under control. He continues to have issues with significant swelling of lower extremities and this is not limited to just his lower left portion of his legs but his thighs are fairly large as well. With that being said I really feel like that we have reached the extent of what we have to offer here at the wound care center I am wondering if we can see about making a referral for second opinion to [facility] to see if there is anything they could do to help him out at this point. It is really not much further from his home to there as it is from his home to here. Both are about an hour distance. He lives in [place] . 02-04-2023 upon evaluation today patient appears to be doing much better  actually compared to last week's evaluation. This is actually significant improvement and overall I do not know exactly what he did different other than he tells me that he has been taking his fluid pills every day instead of every other day. Obviously this is needed. I was under the understanding previously that he was taking this every day but I think he may also not been pumping every day like he was telling me but I cannot be sure this either way he does seem to be doing what he supposed to do at this point. 02-11-2023 upon evaluation today patient unfortunately appears to be about as bad today as he appeared good last week. I am not sure what happened other than the fact he did take his wrap off the right leg he tells me it got so tight that he  could not feel his toes they are getting very numb. Subsequently he tells me that it was difficulty with driving therefore he took it off. With that being said I do believe that he may need to go to the ER for evaluation and treatment I feel he may need to be admitted to get some of the fluid off of his legs. I think that he is volume overloaded and at this point I do not think there is much working to be able to do to manage this outpatient we have tried pretty much everything that I am going to do. 02-18-23 upon evaluation today patient appears to be doing pretty well currently compared to last week this is actually better today. He is less weeping and the legs are actually significantly smaller. He tells me has been trying to stay in bed with his legs elevated more. 02-25-2023 upon evaluation today patient appears to be doing well currently in regard to his legs in general compared to where we have been. Fortunately I do not see any signs of active infection locally or systemically which is great news and in general I do believe that we are making good headway here towards complete closure. I am hopeful that he will continue to show signs of improvement as  we progress going forward. He does seem to have been taking his fluid pills as well as elevating his legs and using lymphedema pumps on a regular basis. He is trying to stay out of the hospital. 03-04-2023 upon evaluation today patient appears to be doing well currently in regard to his wound. He has been tolerating the dressing changes without complication. Fortunately there does not appear to be any signs of active infection at this time. 03-11-2023 upon evaluation today patient appears to be doing well currently in regard to his wounds. He has been tolerating the dressing changes without complication. Fortunately I do not see any signs of active infection at this time which is great news. 11/6; patient missed his clinic appointment last week. He took his wraps off at some point. Did not take his diuretics because he was nauseated last week. He comes in with massive bilateral edema which is lymphedema and probably some degree of systemic fluid overload. 11/13; patient with very poorly controlled lymphedema. We have been putting 4-layer equivalent wraps on him he says he is using his compression pumps twice a day. And elevating his legs nevertheless the edema control today is very poor. He has a substantial wound on the right medial lower leg also of the left medial and left posterior. We have been using alginate as a primary dressing or Aquacel Ag 11/20; some improvement in the amount of edema. We have been using 4-layer equivalent wraps and he is pumping with his external compression pumps twice a day. His legs had improved in terms of edema although there is still massively edematous. He tells me that he is on torsemide  1-1/2 tablets 20 mg tabs a day 12/4; massive edema extending up into his groin. Interestingly his edema in the thighs is more pitting then nonpitting lymphedema. His wounds are on both legs. This is not something that I think we can debride although I did do this last week I am not  sure this did him any good. We have been using 4-layer compression he is using his pumps twice a day. I misunderstood his torsemide  dosage which apparently is 1 tablet alternating with a half a tablet daily. I think he probably needs  more than that. Unfortunately he cannot get a hold of his cardiologist... 12/11 massive edema in the bilateral lower legs extending up into his posterior thighs towards his groin. He tells me he took his compression wrap off on Sunday. He says he is using his external compression pumps twice a day. He has wounds predominantly on the right medial leg left lateral left anterior and left medial leg. The patient states he has tried to contact his cardiologist vis--vis diuretic use and he just cannot get a hold of anybody in the office 12/18; massive edema in his bilateral lower legs extending up into his groin. He has an elevated jugular venous pressure. He tells me his torsemide  was [name], [name] ([id number]) [phone].pdf Page 12 of 14 increased to 30 mg he takes 130 mg tablet. He has wounds on the right medial left medial left anterior and left lateral lower legs they are covered in a nonviable surface but with this amount of edema debridement just is not indicated. He says he is using his compression pumps 3 times a day he had to cut down the wraps because they were too loose. 05/27/2023; patient presents for follow-up. He has not been here in 3 weeks. He states he is using his lymphedema pumps. He is using silver alginate to the wound beds under his Velcro compression wraps. 06/02/2023; patient presents for follow-up. At last clinic visit we restarted the compression wraps. We have been using antibiotic ointment and antifungal cream with silver alginate under 4-layer compression. He states he has had [facility] antibiotic ointment in the past and this has done well for him. He states he has been using his lymphedema pumps. Wounds have  improved. Objective Constitutional respirations regular, non-labored and within target range for patient.. Vitals Time Taken: 2:52 PM, Height: 74 in, Weight: 250 lbs, BMI: 32.1, Temperature: 98.1 F, Pulse: 74 bpm, Respiratory Rate: 17 breaths/min, Blood Pressure: 190/78 mmHg. Cardiovascular 2+ dorsalis pedis/posterior tibialis pulses. Psychiatric pleasant and cooperative. General Notes: Scattered open wounds to the lower extremities bilaterally with non viable tissue. Stage IV lymphedema. No obvious signs of overt soft tissue infection. Integumentary (Hair, Skin) Wound #17 status is Open. Original cause of wound was Gradually Appeared. The date acquired was: 05/20/2023. The wound is located on the Right,Anterior Lower Leg. The wound measures 1.5cm length x 1cm width x 0.1cm depth; 1.178cm^2 area and 0.118cm^3 volume. There is Fat Layer (Subcutaneous Tissue) exposed. There is no tunneling or undermining noted. There is a large amount of serosanguineous drainage noted. Foul odor after cleansing was noted. There is no granulation within the wound bed. There is a large (67-100%) amount of necrotic tissue within the wound bed including Adherent Slough. The periwound skin appearance did not exhibit: Callus, Crepitus, Excoriation, Induration, Rash, Scarring, Dry/Scaly, Maceration, Atrophie Blanche, Cyanosis, Ecchymosis, Hemosiderin Staining, Mottled, Pallor, Rubor, Erythema. Wound #6 status is Open. Original cause of wound was Gradually Appeared. The date acquired was: 03/05/2022. The wound has been in treatment 61 weeks. The wound is located on the Right,Medial Lower Leg. The wound measures 17cm length x 20cm width x 0.2cm depth; 267.035cm^2 area and 53.407cm^3 volume. There is Fat Layer (Subcutaneous Tissue) exposed. There is no tunneling or undermining noted. There is a large amount of serous drainage noted. The wound margin is distinct with the outline attached to the wound base. There is no  granulation within the wound bed. There is a large (67-100%) amount of necrotic tissue within the wound bed including Adherent Slough. The periwound skin  appearance exhibited: Scarring, Hemosiderin Staining. The periwound skin appearance did not exhibit: Callus, Crepitus, Excoriation, Induration, Rash, Dry/Scaly, Maceration, Atrophie Blanche, Cyanosis, Ecchymosis, Mottled, Pallor, Rubor, Erythema. Periwound temperature was noted as No Abnormality. Wound #7 status is Open. Original cause of wound was Gradually Appeared. The date acquired was: 03/05/2022. The wound has been in treatment 61 weeks. The wound is located on the Left,Circumferential Lower Leg. The wound measures 17cm length x 46cm width x 0.2cm depth; 614.181cm^2 area and 122.836cm^3 volume. There is Fat Layer (Subcutaneous Tissue) exposed. There is no tunneling or undermining noted. There is a large amount of serous drainage noted. The wound margin is distinct with the outline attached to the wound base. There is small (1-33%) red granulation within the wound bed. There is a large (67-100%) amount of necrotic tissue within the wound bed including Adherent Slough. The periwound skin appearance exhibited: Scarring, Hemosiderin Staining. The periwound skin appearance did not exhibit: Callus, Crepitus, Excoriation, Induration, Rash, Dry/Scaly, Maceration, Atrophie Blanche, Cyanosis, Ecchymosis, Mottled, Pallor, Rubor, Erythema. Periwound temperature was noted as No Abnormality. 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 There is less weeping and odor today. Less bioburden. I did recommend restarting the [facility] antibiotic and a PCR culture was obtained today. For now we will continue with the antibiotic ointment and antifungal cream with silver alginate under compression therapy. Follow-up in 1 week. Continue lymphedema pumps. Procedures Wound #17 Pala, [name] ([id number]) [id number] Page 13 of 14 Pre-procedure diagnosis of Wound #17 is a Lymphedema located on the Right,Anterior Lower Leg . There was a Four Layer Compression Therapy Procedure by Lanis Maxwell, RN. Post procedure Diagnosis Wound #17: Same as Pre-Procedure Wound #6 Pre-procedure diagnosis of Wound #6 is a Lymphedema located on the Right,Medial Lower Leg . There was a Four Layer Compression Therapy Procedure by Lanis Maxwell, RN. Post procedure Diagnosis Wound #6: Same as Pre-Procedure Wound #7 Pre-procedure diagnosis of Wound #7 is a Lymphedema located on the Left,Circumferential Lower Leg . There was a Four Layer Compression Therapy Procedure by Lanis Maxwell, RN. Post procedure Diagnosis Wound #7: Same as Pre-Procedure Plan Follow-up Appointments: Return Appointment in 1 week. - +++ Extra Time++++Dr. Rosan Tuesday Other: - take your fluid pills as prescribed. Speak with cardiologist about increasing fluid pills T wraps off in 1 wk, dress wounds yourself, wear stockings, ake continue to pump 3x day Anesthetic: (In clinic) Topical Lidocaine  4% applied to wound bed Bathing/ Shower/ Hygiene: May shower with protection but do not get wound dressing(s) wet. Protect dressing(s) with water repellant cover (for example, large plastic bag) or a cast cover and may then take shower. Edema Control - Orders / Instructions: Segmental Compressive Device. Use the Segmental Compressive Device on leg(s) 2-3 times a day for 45 - 60 minutes. If wearing any wraps or hose, do  not remove them. Continue exercising as instructed. - x3 a day Elevate legs to the level of the heart or above for 30 minutes daily and/or when sitting for 3-4 times a day throughout the day. Avoid standing for long periods of time. Exercise regularly Off-Loading: Other: - When sitting please keep your feet up, keep legs  at Heart Level or above. Use pillows behind calf to for comfort. Additional Orders / Instructions: Follow Nutritious Diet - Try and increase protein intake. Goal of protein intake is 60g-100g. Lymphedema Treatment Plan - Exercise, Compression and Elevation: Exercise daily as tolerated. (Walking, ROM, Calf Pumps and T T oe aps) Elevate legs 30 - 60 minutes at or above heart level at least 3 - 4 times daily as able/tolerated Avoid standing for long periods and elevate leg(s) parallel to the floor when sitting Use Pneumatic Compression Device on leg(s) 2-3 times a day for 45-60 minutes - x3 a day Laboratory ordered were: Anaerobic culture - PCR-vikor WOUND #17: - Lower Leg Wound Laterality: Right, Anterior Cleanser: Soap and Water Every Other Day/30 Days Discharge Instructions: May shower and wash wound with dial antibacterial soap and water prior to dressing change. Peri-Wound Care: Sween Lotion (Moisturizing lotion) Every Other Day/30 Days Discharge Instructions: Apply moisturizing lotion as directed Topical: Gentamicin Every Other Day/30 Days Discharge Instructions: As directed by physician Topical: Mupirocin Ointment Every Other Day/30 Days Discharge Instructions: Apply Mupirocin (Bactroban) as instructed Topical: Ketoconazole Cream 2% Every Other Day/30 Days Discharge Instructions: Apply Ketoconazole as directed Prim Dressing: Maxorb Extra CMC/Alginate Dressing, 4x4 (in/in) (Generic) Every Other Day/30 Days ary Discharge Instructions: Apply to wound bed as instructed Secondary Dressing: ABD Pad, 8x10 Every Other Day/30 Days Discharge Instructions: Apply over primary  dressing as directed. Secondary Dressing: Zetuvit Plus 4x8 in Every Other Day/30 Days Discharge Instructions: Apply over primary dressing as directed. Secondary Dressing: OptiLock Super Absorbent, 5x5.5 (in/in) (Generic) Every Other Day/30 Days Discharge Instructions: Apply directly to wound bed as directed Com pression Wrap: Urgo K2, (equivalent to a 4 layer) two layer compression system, regular Every Other Day/30 Days Discharge Instructions: Apply Urgo K2 as directed (alternative to 4 layer compression). WOUND #6: - Lower Leg Wound Laterality: Right, Medial Cleanser: Soap and Water Every Other Day/30 Days Discharge Instructions: May shower and wash wound with dial antibacterial soap and water prior to dressing change. Peri-Wound Care: Sween Lotion (Moisturizing lotion) Every Other Day/30 Days Discharge Instructions: Apply moisturizing lotion as directed Topical: Gentamicin Every Other Day/30 Days Discharge Instructions: As directed by physician Topical: Mupirocin Ointment Every Other Day/30 Days Discharge Instructions: Apply Mupirocin (Bactroban) as instructed Topical: Ketoconazole Cream 2% Every Other Day/30 Days Discharge Instructions: Apply Ketoconazole as directed Prim Dressing: Maxorb Extra CMC/Alginate Dressing, 4x4 (in/in) (Generic) Every Other Day/30 Days ary Discharge Instructions: Apply to wound bed as instructed Secondary Dressing: ABD Pad, 8x10 Every Other Day/30 Days Discharge Instructions: Apply over primary dressing as directed. Secondary Dressing: Zetuvit Plus 4x8 in Every Other Day/30 Days Discharge Instructions: Apply over primary dressing as directed. Secondary Dressing: OptiLock Super Absorbent, 5x5.5 (in/in) (Generic) Every Other Day/30 Days Discharge Instructions: Apply directly to wound bed as directed Com pression Wrap: Urgo K2, (equivalent to a 4 layer) two layer compression system, regular Every Other Day/30 Days Discharge Instructions: Apply Urgo K2 as  directed (alternative to 4 layer compression). [name], [name] ([id number]) [id number] Page 14 of 14 WOUND #7: - Lower Leg Wound Laterality: Left, Circumferential Cleanser: Soap and Water Every Other Day/30 Days Discharge Instructions: May shower and wash wound with dial antibacterial soap and water prior to dressing change. Peri-Wound Care: Sween Lotion (Moisturizing lotion) Every Other Day/30 Days Discharge Instructions: Apply moisturizing lotion as directed Topical: Gentamicin Every Other Day/30 Days Discharge Instructions: As directed by physician Topical: Mupirocin Ointment Every Other Day/30 Days Discharge Instructions: Apply Mupirocin (Bactroban) as instructed Topical: Ketoconazole Cream  2% Every Other Day/30 Days Discharge Instructions: Apply Ketoconazole as directed Prim Dressing: Maxorb Extra CMC/Alginate Dressing, 4x4 (in/in) (Generic) Every Other Day/30 Days ary Discharge Instructions: Apply to wound bed as instructed Secondary Dressing: ABD Pad, 8x10 Every Other Day/30 Days Discharge Instructions: Apply over primary dressing as directed. Secondary Dressing: Zetuvit Plus 4x8 in Every Other Day/30 Days Discharge Instructions: Apply over primary dressing as directed. Secondary Dressing: OptiLock Super Absorbent, 5x5.5 (in/in) (Generic) Every Other Day/30 Days Discharge Instructions: Apply directly to wound bed as directed Com pression Wrap: Urgo K2, (equivalent to a 4 layer) two layer compression system, regular Every Other Day/30 Days Discharge Instructions: Apply Urgo K2 as directed (alternative to 4 layer compression). 1. Antibiotic ointment, antifungal cream and silver alginate under 4-layer compression to the lower extremities bilaterally 2. PCR culture for [facility] antibiotic ointment 3. Follow-up in 1 week 4. Continue lymphedema pumps Electronic Signature(s) Signed: 06/02/2023 3:39:41 PM By: Rosan Raisin DO Entered By: Rosan Raisin on  06/02/2023 15:38:35 -------------------------------------------------------------------------------- SuperBill Details Patient Name: Date of Service: Evers, [name] 06/02/2023 Medical Record Number: [id number] Patient Account Number: [id number] Date of Birth/Sex: Treating RN: 03/19/1946 (78 y.o. M) Primary Care Provider: Claudene Salena RAMAN Other Clinician: Referring Provider: Treating Provider/Extender: Rosan Raisin Claudene Salena RAMAN Weeks in Treatment: 3 Diagnosis Coding ICD-10 Codes Code Description [phone] Chronic venous hypertension (idiopathic) with ulcer and inflammation of bilateral lower extremity I89.0 Lymphedema, not elsewhere classified 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.818 Non-pressure chronic ulcer of other part of right lower leg with other specified severity I50.42 Chronic combined systolic (congestive) and diastolic (congestive) heart failure Physician Procedures : CPT4 Code Description Modifier [id number] - WC PHYS LEVEL 3 - EST PT ICD-10 Diagnosis Description I87.333 Chronic venous hypertension (idiopathic) with ulcer and inflammation of bilateral lower extremity I89.0 Lymphedema, not elsewhere classified  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.818 Non-pressure chronic ulcer of other part of right lower leg with other specified severity Quantity: 1 Electronic Signature(s) Signed: 06/02/2023 3:39:41 PM By: Rosan Raisin DO Entered By: Rosan Raisin on 06/02/2023 15:38:49

## 2023-06-03 NOTE — Progress Notes (Signed)
 Finnie, SALOMON ([id number]) [id number] Page 1 of 16 Visit Report for 05/27/2023 Arrival Information Details Patient Name: Date of Service: [name], [name] 05/27/2023 2:45 PM Medical Record Number: [id number] Patient Account Number: [id number] Date of Birth/Sex: Treating RN: 1946-03-07 (78 y.o. M) Primary Care Kynzlie Hilleary: Claudene Salena RAMAN Other Clinician: Referring Keiandre Cygan: Treating Navayah Sok/Extender: Rosan Harlene Claudene Salena RAMAN Weeks in Treatment: 50 Visit Information History Since Last Visit Added or deleted any medications: No Patient Arrived: Cane Any new allergies or adverse reactions: No Arrival Time: 15:18 Had a fall or experienced change in No Accompanied By: self activities of daily living that may affect Transfer Assistance: None risk of falls: Patient Identification Verified: Yes Signs or symptoms of abuse/neglect since last visito No Secondary Verification Process Completed: Yes Hospitalized since last visit: No Patient Requires Transmission-Based Precautions: No Implantable device outside of the clinic excluding No Patient Has Alerts: Yes cellular tissue based products placed in the center Patient Alerts: Patient on Blood Thinner since last visit: Right ABI in clinic [place] Has Dressing in Place as Prescribed: No Has Compression in Place as Prescribed: No Pain Present Now: No Electronic Signature(s) Signed: 06/01/2023 5:23:50 PM By: Wyn Iha Entered By: Wyn Iha on 05/27/2023 15:25:05 -------------------------------------------------------------------------------- Compression Therapy Details Patient Name: Date of Service: [name], [name] 05/27/2023 2:45 PM Medical Record Number: [id number] Patient Account Number: [id number] Date of Birth/Sex: Treating RN: 10/27/1945 (77 y.o. NETTY Claven Pollen Primary Care Laelia Angelo: Claudene Salena RAMAN Other Clinician: Referring Iline Buchinger: Treating Nason Conradt/Extender: Rosan Harlene Claudene Salena RAMAN Weeks in Treatment: 34 Compression Therapy Performed for Wound Assessment: Wound #6 Right,Medial Lower Leg Performed By: Clinician Claven Pollen, RN Compression Type: Four Layer Post Procedure Diagnosis Same as Pre-procedure Electronic Signature(s) Signed: 05/27/2023 6:36:40 PM By: Claven Pollen RN Entered By: Claven Pollen on 05/27/2023 17:22:36 Merkel, SALOMON ([id number]) [id number] Page 2 of 16 -------------------------------------------------------------------------------- Compression Therapy Details Patient Name: Date of Service: [name], [name] 05/27/2023 2:45 PM Medical Record Number: [id number] Patient Account Number: [id number] Date of Birth/Sex: Treating RN: 12-Apr-1946 (78 y.o. NETTY Claven Pollen Primary Care Antinette Keough: Claudene Salena RAMAN Other Clinician: Referring Sumiya Mamaril: Treating Davanee Klinkner/Extender: Rosan Harlene Claudene Salena RAMAN Weeks in Treatment: 69 Compression Therapy Performed for Wound Assessment: Wound #17 Right,Anterior Lower Leg Performed By: Clinician Claven Pollen, RN Compression Type: Four Layer Post Procedure Diagnosis Same as Pre-procedure Electronic Signature(s) Signed: 05/27/2023 6:36:40 PM By: Claven Pollen RN Entered By: Claven Pollen on 05/27/2023 17:22:36 -------------------------------------------------------------------------------- Compression Therapy Details Patient Name: Date of Service: [name], [name] 05/27/2023 2:45 PM Medical Record Number: [id number] Patient Account Number: [id number] Date of Birth/Sex: Treating RN: 09/23/1945 (78 y.o. NETTY Claven Pollen Primary Care Rodgerick Gilliand: Claudene Salena RAMAN Other Clinician: Referring Kyliyah Stirn: Treating Ledon Weihe/Extender: Rosan Harlene Claudene Salena RAMAN Weeks in Treatment: 38 Compression Therapy Performed for Wound Assessment: Wound #7 Left,Circumferential Lower Leg Performed By: Clinician Claven Pollen, RN Compression Type: Four Layer Post Procedure  Diagnosis Same as Pre-procedure Electronic Signature(s) Signed: 05/27/2023 6:36:40 PM By: Claven Pollen RN Entered By: Claven Pollen on 05/27/2023 17:22:36 -------------------------------------------------------------------------------- Encounter Discharge Information Details Patient Name: Date of Service: [name], [name] 05/27/2023 2:45 PM Medical Record Number: [id number] Patient Account Number: [id number] Date of Birth/Sex: Treating RN: 1946/01/29 (78 y.o. NETTY Claven Pollen Primary Care Shauntel Prest: Claudene Salena RAMAN Other Clinician: Referring Marchel Foote: Treating Tacey Dimaggio/Extender: Rosan Harlene Claudene Salena RAMAN Weeks in Treatment: 71 Encounter Discharge Information Items Discharge Condition: Stable Ambulatory Status: Cane Discharge Destination: Home Transportation: Private Auto Accompanied By: self Schedule Follow-up Appointment: Yes Clinical Summary of Care: Patient  Declined Maino, Kire ([id number]) [id number] Page 3 of 16 Electronic Signature(s) Signed: 05/27/2023 6:36:40 PM By: Claven Pollen RN Entered By: Claven Pollen on 05/27/2023 17:33:39 -------------------------------------------------------------------------------- Lower Extremity Assessment Details Patient Name: Date of Service: [name], [name] 05/27/2023 2:45 PM Medical Record Number: [id number] Patient Account Number: [id number] Date of Birth/Sex: Treating RN: 10-Jun-1945 (78 y.o. M) Primary Care Saman Giddens: Claudene Salena RAMAN Other Clinician: Referring Margean Korell: Treating Malachy Coleman/Extender: Rosan Harlene Claudene Salena RAMAN Weeks in Treatment: 49 Edema Assessment Assessed: [Left: No] [Right: No] Edema: [Left: Yes] [Right: Yes] Calf Left: Right: Point of Measurement: 40 cm From Medial Instep 65.5 cm 63.2 cm Ankle Left: Right: Point of Measurement: 13 cm From Medial Instep 44 cm 45 cm Vascular Assessment Extremity colors, hair growth, and conditions: Extremity Color:  [Left:Hyperpigmented] [Right:Hyperpigmented] Hair Growth on Extremity: [Left:No] Temperature of Extremity: [Left:Warm] [Right:Warm] Capillary Refill: [Left:> 3 seconds] [Right:> 3 seconds] Dependent Rubor: [Left:No Yes] [Right:No Yes] Toe Nail Assessment Left: Right: Thick: Yes Yes Discolored: Yes Yes Deformed: Yes Yes Improper Length and Hygiene: Yes Yes Electronic Signature(s) Signed: 06/01/2023 5:23:50 PM By: Wyn Iha Entered By: Wyn Iha on 05/27/2023 15:49:46 -------------------------------------------------------------------------------- Multi Wound Chart Details Patient Name: Date of Service: [name], [name] 05/27/2023 2:45 PM Medical Record Number: [id number] Patient Account Number: [id number] Date of Birth/Sex: Treating RN: [birth year] (78 y.o. M) Primary Care Noel Rodier: Claudene Salena RAMAN Other Clinician: Referring Sulo Janczak: Treating Arianni Gallego/Extender: Rosan Harlene Claudene Salena RAMAN Weeks in Treatment: 65 Vital Signs Rickles, SALOMON ([id number]) [id number] Page 4 of 16 Height(in): 74 Pulse(bpm): 90 Weight(lbs): 250 Blood Pressure(mmHg): 163/64 Body Mass Index(BMI): 32.1 Temperature(F): 98 Respiratory Rate(breaths/min): 18 [12:Photos: No Photos Left, Anterior Lower Leg Wound Location: Frostbite Wounding Event: Lymphedema Primary Etiology: Anemia, Lymphedema, Deep Vein Comorbid History: Thrombosis, Hypertension, Received Radiation 12/17/2022 Date Acquired: 23 Weeks of Treatment:  Converted Wound Status: No Wound Recurrence: No Clustered Wound: N/A Clustered Quantity: 11x53x0.1 Measurements L x W x D (cm) 457.887 A (cm) : rea 45.789 Volume (cm) : -12856.60% % Reduction in Area: -6376.50% % Reduction in Volume: Full Thickness  Without Exposed Classification: Support Structures Large Exudate A mount: Serous Exudate Type: amber Exudate Color: No Foul Odor A Cleansing: fter N/A Odor A nticipated Due to Product Use: Fibrotic scar, thickened scar  Wound Margin: Small (1-33%)  Granulation A mount: Pink Granulation Quality: Large (67-100%) Necrotic A mount: Fat Layer (Subcutaneous Tissue): Yes N/A Exposed Structures: Small (1-33%) Epithelialization: Scarring: Yes Periwound Skin Texture: No Abnormalities Noted Periwound Skin  Moisture: No Abnormalities Noted Periwound Skin Color: No Abnormality Temperature: N/A Procedures Performed:] [15:No Photos Left, Distal, Posterior Lower Leg Gradually Appeared Lymphedema Anemia, Lymphedema, Deep Vein Thrombosis, Hypertension, Received  Radiation 01/14/2023 14 Converted No No N/A 0.5x1x0.1 0.393 0.039 95.60% 95.60% Full Thickness Without Exposed Support Structures Medium Serosanguineous red, brown N/A N/A N/A N/A N/A N/A N/A N/A N/A] [16:No Photos Left, Lateral Lower Leg Blister Venous  Leg Ulcer Anemia, Lymphedema, Deep Vein Thrombosis, Hypertension, Received Radiation 04/01/2023 8 Converted No Yes N/A 6.5x6x0.1 30.631 3.063 -455.60% -455.90% Full Thickness Without Exposed Support Structures Medium Serous amber N/A N/A N/A N/A N/A N/A  N/A N/A N/A N/A] Wound Number: 17 6 7  Photos: Right, Anterior Lower Leg Right, Medial Lower Leg Left, Circumferential Lower Leg Wound Location: Gradually Appeared Gradually Appeared Gradually Appeared Wounding Event: Lymphedema Lymphedema Lymphedema Primary Etiology: Anemia, Lymphedema, Deep Vein Anemia, Lymphedema, Deep Vein Anemia, Lymphedema, Deep Vein Comorbid History: Thrombosis, Hypertension, Received Thrombosis, Hypertension, Received Thrombosis, Hypertension, Received Gharibian, Todd ([id number]) [id number] Page 5 of 16  Radiation Radiation Radiation 05/20/2023 03/05/2022 03/05/2022 Date Acquired: 0 61 61 Weeks of Treatment: Open Open Open Wound Status: No No No Wound Recurrence: No Yes Yes Clustered Wound: N/A 1 1 Clustered Quantity: 3x2x0.1 13.5x22x0.2 11x53x0.1 Measurements L x W x D (cm) 4.712 233.263 457.887 A (cm)  : rea 0.471 46.653 45.789 Volume (cm) : N/A -63.20% -871.70% % Reduction in Area: N/A -226.40% -871.80% % Reduction in Volume: Full Thickness Without Exposed Full Thickness Without Exposed Full Thickness Without Exposed Classification: Support Structures Support Structures Support Structures Museum/gallery Conservator Exudate Amount: Serosanguineous Serous Serous Exudate Type: red, brown amber amber Exudate Color: Yes No No Foul Odor A Cleansing: fter No N/A N/A Odor Anticipated Due to Product Use: N/A Distinct, outline attached Distinct, outline attached Wound Margin: None Present (0%) None Present (0%) Small (1-33%) Granulation A mount: N/A N/A Red Granulation Quality: Large (67-100%) Large (67-100%) Large (67-100%) Necrotic Amount: Fat Layer (Subcutaneous Tissue): Yes Fat Layer (Subcutaneous Tissue): Yes Fat Layer (Subcutaneous Tissue): Yes Exposed Structures: Fascia: No Fascia: No Fascia: No Tendon: No Tendon: No Tendon: No Muscle: No Muscle: No Muscle: No Joint: No Joint: No Joint: No Bone: No Bone: No Bone: No None None Small (1-33%) Epithelialization: Excoriation: No Scarring: Yes Scarring: Yes Periwound Skin Texture: Induration: No Excoriation: No Excoriation: No Callus: No Induration: No Induration: No Crepitus: No Callus: No Callus: No Rash: No Crepitus: No Crepitus: No Scarring: No Rash: No Rash: No Maceration: No Maceration: No Maceration: No Periwound Skin Moisture: Dry/Scaly: No Dry/Scaly: No Dry/Scaly: No Atrophie Blanche: No Hemosiderin Staining: Yes Hemosiderin Staining: Yes Periwound Skin Color: Cyanosis: No Atrophie Blanche: No Atrophie Blanche: No Ecchymosis: No Cyanosis: No Cyanosis: No Erythema: No Ecchymosis: No Ecchymosis: No Hemosiderin Staining: No Erythema: No Erythema: No Mottled: No Mottled: No Mottled: No Pallor: No Pallor: No Pallor: No Rubor: No Rubor: No Rubor: No N/A No Abnormality No  Abnormality Temperature: Compression Therapy Compression Therapy Compression Therapy Procedures Performed: Treatment Notes Wound #12 (Lower Leg) Wound Laterality: Left, Anterior Cleanser Peri-Wound Care Topical Primary Dressing Secondary Dressing Secured With Compression Wrap Compression Stockings Add-Ons Wound #15 (Lower Leg) Wound Laterality: Left, Posterior, Distal Cleanser Peri-Wound Care Topical Primary Dressing Secondary Dressing Secured With Compression Wrap Erlandson, Levin ([id number]) [id number] Page 6 of 16 Compression Stockings Add-Ons Wound #16 (Lower Leg) Wound Laterality: Left, Lateral Cleanser Peri-Wound Care Topical Primary Dressing Secondary Dressing Secured With Compression Wrap Compression Stockings Add-Ons Wound #17 (Lower Leg) Wound Laterality: Right, Anterior Cleanser Soap and Water Discharge Instruction: May shower and wash wound with dial antibacterial soap and water prior to dressing change. Peri-Wound Care Sween Lotion (Moisturizing lotion) Discharge Instruction: Apply moisturizing lotion as directed Topical Gentamicin Discharge Instruction: As directed by physician Mupirocin Ointment Discharge Instruction: Apply Mupirocin (Bactroban) as instructed Ketoconazole Cream 2% Discharge Instruction: Apply Ketoconazole as directed Primary Dressing Maxorb Extra CMC/Alginate Dressing, 4x4 (in/in) Discharge Instruction: Apply to wound bed as instructed Secondary Dressing ABD Pad, 8x10 Discharge Instruction: Apply over primary dressing as directed. Zetuvit Plus 4x8 in Discharge Instruction: Apply over primary dressing as directed. OptiLock Super Absorbent, 5x5.5 (in/in) Discharge Instruction: Apply directly to wound bed as directed Secured With Compression Wrap Urgo K2, (equivalent to a 4 layer) two layer compression system, regular Discharge Instruction: Apply Urgo K2 as directed (alternative to 4 layer  compression). Compression Stockings Add-Ons Wound #6 (Lower Leg) Wound Laterality: Right, Medial Cleanser Soap and Water Discharge Instruction: May shower and wash wound with dial antibacterial soap and water prior to dressing change. Peri-Wound  Care Sween Lotion (Moisturizing lotion) Discharge Instruction: Apply moisturizing lotion as directed Topical Schuchard, Adelard ([id number]) [id number] Page 7 of 16 Gentamicin Discharge Instruction: As directed by physician Mupirocin Ointment Discharge Instruction: Apply Mupirocin (Bactroban) as instructed Ketoconazole Cream 2% Discharge Instruction: Apply Ketoconazole as directed Primary Dressing Maxorb Extra CMC/Alginate Dressing, 4x4 (in/in) Discharge Instruction: Apply to wound bed as instructed Secondary Dressing ABD Pad, 8x10 Discharge Instruction: Apply over primary dressing as directed. Zetuvit Plus 4x8 in Discharge Instruction: Apply over primary dressing as directed. OptiLock Super Absorbent, 5x5.5 (in/in) Discharge Instruction: Apply directly to wound bed as directed Secured With Compression Wrap Urgo K2, (equivalent to a 4 layer) two layer compression system, regular Discharge Instruction: Apply Urgo K2 as directed (alternative to 4 layer compression). Compression Stockings Add-Ons Wound #7 (Lower Leg) Wound Laterality: Left, Circumferential Cleanser Soap and Water Discharge Instruction: May shower and wash wound with dial antibacterial soap and water prior to dressing change. Peri-Wound Care Sween Lotion (Moisturizing lotion) Discharge Instruction: Apply moisturizing lotion as directed Topical Gentamicin Discharge Instruction: As directed by physician Mupirocin Ointment Discharge Instruction: Apply Mupirocin (Bactroban) as instructed Ketoconazole Cream 2% Discharge Instruction: Apply Ketoconazole as directed Primary Dressing Maxorb Extra CMC/Alginate Dressing, 4x4 (in/in) Discharge  Instruction: Apply to wound bed as instructed Secondary Dressing ABD Pad, 8x10 Discharge Instruction: Apply over primary dressing as directed. Zetuvit Plus 4x8 in Discharge Instruction: Apply over primary dressing as directed. OptiLock Super Absorbent, 5x5.5 (in/in) Discharge Instruction: Apply directly to wound bed as directed Secured With Compression Wrap Urgo K2, (equivalent to a 4 layer) two layer compression system, regular Discharge Instruction: Apply Urgo K2 as directed (alternative to 4 layer compression). Compression Stockings Add-Ons Electronic Signature(s) Vernier, Landan ([id number]) [id number] Page 8 of 16 Signed: 05/29/2023 11:00:26 AM By: Rosan Raisin DO Entered By: Rosan Raisin on 05/29/2023 10:19:00 -------------------------------------------------------------------------------- Multi-Disciplinary Care Plan Details Patient Name: Date of Service: Gimpel, [name] 05/27/2023 2:45 PM Medical Record Number: [id number] Patient Account Number: [id number] Date of Birth/Sex: Treating RN: 1945-06-15 (78 y.o. NETTY Claven Pollen Primary Care Kenyata Guess: Claudene Salena RAMAN Other Clinician: Referring Tasheema Perrone: Treating Zyere Jiminez/Extender: Rosan Raisin Claudene Salena RAMAN Weeks in Treatment: 30 Multidisciplinary Care Plan reviewed with physician Active Inactive Wound/Skin Impairment Nursing Diagnoses: Impaired tissue integrity Knowledge deficit related to ulceration/compromised skin integrity Goals: Patient will have a decrease in wound volume by X% from date: (specify in notes) Date Initiated: 03/26/2022 Target Resolution Date: 05/19/2025 Goal Status: Active Patient/caregiver will verbalize understanding of skin care regimen Date Initiated: 03/26/2022 Date Inactivated: 03/25/2023 Target Resolution Date: 04/17/2025 Unmet Reason: patient missed last Goal Status: Unmet week. Patient Stop taking his diuertic. Ulcer/skin breakdown will have a volume  reduction of 30% by week 4 Date Initiated: 03/26/2022 Date Inactivated: 05/21/2022 Target Resolution Date: 05/17/2022 Unmet Reason: see wound Goal Status: Unmet measurement. Ulcer/skin breakdown will have a volume reduction of 50% by week 8 Date Initiated: 03/26/2022 Date Inactivated: 05/21/2022 Target Resolution Date: 05/17/2022 Unmet Reason: see wound Goal Status: Unmet measurement. Interventions: Assess patient/caregiver ability to obtain necessary supplies Assess patient/caregiver ability to perform ulcer/skin care regimen upon admission and as needed Assess ulceration(s) every visit Notes: Patient stated today, I will take my fluid pill or pump not do both. Vermelle Cammarata made aware. Electronic Signature(s) Signed: 05/27/2023 6:36:40 PM By: Claven Pollen RN Entered By: Claven Pollen on 05/27/2023 17:23:14 -------------------------------------------------------------------------------- Pain Assessment Details Patient Name: Date of Service: [name], [name] 05/27/2023 2:45 PM Medical Record Number: [id number] Patient Account Number: [id number] Date of Birth/Sex: Treating RN: 18-Mar-1946 (77 y.o.  M) Primary Care Carley Strickling: Claudene Salena RAMAN Other Clinician: Referring Meriam Chojnowski: Treating Emile Kyllo/Extender: Rosan Harlene Claudene Salena RAMAN Weeks in Treatment: 6 Leavy, SALOMON ([id number]) [id number] Page 9 of 16 Active Problems Location of Pain Severity and Description of Pain Patient Has Paino No Site Locations Pain Management and Medication Current Pain Management: Electronic Signature(s) Signed: 06/01/2023 5:23:50 PM By: Wyn Iha Entered By: Wyn Iha on 05/27/2023 15:47:48 -------------------------------------------------------------------------------- Patient/Caregiver Education Details Patient Name: Date of Service: [name], [name] 1/8/2025andnbsp2:45 PM Medical Record Number: [id number] Patient Account Number: [id number] Date of  Birth/Gender: Treating RN: June 21, 1945 (78 y.o. NETTY Claven Pollen Primary Care Physician: Claudene Salena RAMAN Other Clinician: Referring Physician: Treating Physician/Extender: Rosan Harlene Claudene Salena RAMAN Devra in Treatment: 54 Education Assessment Education Provided To: Patient Education Topics Provided Wound/Skin Impairment: Methods: Explain/Verbal Responses: State content correctly Electronic Signature(s) Signed: 05/27/2023 6:36:40 PM By: Claven Pollen RN Entered By: Claven Pollen on 05/27/2023 17:23:50 Kneip, SALOMON ([id number]) [id number] Page 10 of 16 -------------------------------------------------------------------------------- Wound Assessment Details Patient Name: Date of Service: [name], [name] 05/27/2023 2:45 PM Medical Record Number: [id number] Patient Account Number: [id number] Date of Birth/Sex: Treating RN: Jan 03, 1946 (78 y.o. M) Primary Care Kohler Pellerito: Claudene Salena RAMAN Other Clinician: Referring Emett Stapel: Treating Shannan Garfinkel/Extender: Rosan Harlene Claudene Salena RAMAN Weeks in Treatment: 91 Wound Status Wound Number: 12 Primary Lymphedema Etiology: Wound Location: Left, Anterior Lower Leg Wound Converted Wounding Event: Frostbite Status: Date Acquired: 12/17/2022 Comorbid Anemia, Lymphedema, Deep Vein Thrombosis, Hypertension, Weeks Of Treatment: 23 History: Received Radiation Clustered Wound: No Wound Measurements Length: (cm) 11 Width: (cm) 53 Depth: (cm) 0.1 Area: (cm) 457.887 Volume: (cm) 45.789 % Reduction in Area: -12856.6% % Reduction in Volume: -6376.5% Epithelialization: Small (1-33%) Tunneling: No Undermining: No Wound Description Classification: Full Thickness Without Exposed Suppor Wound Margin: Fibrotic scar, thickened scar Exudate Amount: Large Exudate Type: Serous Exudate Color: amber t Structures Foul Odor After Cleansing: No Slough/Fibrino Yes Wound Bed Granulation Amount: Small (1-33%) Exposed  Structure Granulation Quality: Pink Fat Layer (Subcutaneous Tissue) Exposed: Yes Necrotic Amount: Large (67-100%) Necrotic Quality: Adherent Slough Periwound Skin Texture Texture Color No Abnormalities Noted: No No Abnormalities Noted: Yes Scarring: Yes Temperature / Pain Temperature: No Abnormality Moisture No Abnormalities Noted: Yes Treatment Notes Wound #12 (Lower Leg) Wound Laterality: Left, Anterior Cleanser Peri-Wound Care Topical Primary Dressing Secondary Dressing Secured With Compression Wrap Compression Stockings Add-Ons Electronic Signature(s) Signed: 06/01/2023 5:23:50 PM By: Wyn Iha Entered By: Wyn Iha on 05/27/2023 16:01:55 Countess, SALOMON ([id number]) [id number] Page 11 of 16 -------------------------------------------------------------------------------- Wound Assessment Details Patient Name: Date of Service: [name], [name] 05/27/2023 2:45 PM Medical Record Number: [id number] Patient Account Number: [id number] Date of Birth/Sex: Treating RN: 01/14/1946 (78 y.o. M) Primary Care Darrin Apodaca: Claudene Salena RAMAN Other Clinician: Referring Felisa Zechman: Treating Rhya Shan/Extender: Rosan Harlene Claudene Salena RAMAN Weeks in Treatment: 33 Wound Status Wound Number: 15 Primary Lymphedema Etiology: Wound Location: Left, Distal, Posterior Lower Leg Wound Converted Wounding Event: Gradually Appeared Status: Date Acquired: 01/14/2023 Comorbid Anemia, Lymphedema, Deep Vein Thrombosis, Hypertension, Weeks Of Treatment: 14 History: Received Radiation Clustered Wound: No Wound Measurements Length: (cm) 0.5 Width: (cm) 1 Depth: (cm) 0.1 Area: (cm) 0.393 Volume: (cm) 0.039 % Reduction in Area: 95.6% % Reduction in Volume: 95.6% Wound Description Classification: Full Thickness Without Exposed Suppor Exudate Amount: Medium Exudate Type: Serosanguineous Exudate Color: red, brown t Structures Periwound Skin Texture Texture  Color No Abnormalities Noted: No No Abnormalities Noted: No Moisture No Abnormalities Noted: No Treatment Notes Wound #15 (Lower Leg) Wound Laterality: Left, Posterior, Distal Cleanser Peri-Wound  Care Topical Primary Dressing Secondary Dressing Secured With Compression Wrap Compression Stockings Add-Ons Electronic Signature(s) Signed: 06/01/2023 5:23:50 PM By: Wyn Iha Entered By: Wyn Iha on 05/27/2023 16:02:41 Graziosi, SALOMON ([id number]) [id number] Page 12 of 16 -------------------------------------------------------------------------------- Wound Assessment Details Patient Name: Date of Service: [name], [name] 05/27/2023 2:45 PM Medical Record Number: [id number] Patient Account Number: [id number] Date of Birth/Sex: Treating RN: [birth year] (78 y.o. M) Primary Care Jazyiah Yiu: Claudene Salena RAMAN Other Clinician: Referring Canisha Issac: Treating Hajer Dwyer/Extender: Rosan Harlene Claudene Salena RAMAN Weeks in Treatment: 69 Wound Status Wound Number: 16 Primary Venous Leg Ulcer Etiology: Wound Location: Left, Lateral Lower Leg Wound Converted Wounding Event: Blister Status: Date Acquired: 04/01/2023 Comorbid Anemia, Lymphedema, Deep Vein Thrombosis, Hypertension, Weeks Of Treatment: 8 History: Received Radiation Clustered Wound: Yes Wound Measurements Length: (cm) 6.5 Width: (cm) 6 Depth: (cm) 0.1 Area: (cm) 30.631 Volume: (cm) 3.063 % Reduction in Area: -455.6% % Reduction in Volume: -455.9% Wound Description Classification: Full Thickness Without Exposed Support Exudate Amount: Medium Exudate Type: Serous Exudate Color: amber Structures Periwound Skin Texture Texture Color No Abnormalities Noted: No No Abnormalities Noted: No Moisture No Abnormalities Noted: No Treatment Notes Wound #16 (Lower Leg) Wound Laterality: Left, Lateral Cleanser Peri-Wound Care Topical Primary Dressing Secondary Dressing Secured  With Compression Wrap Compression Stockings Add-Ons Electronic Signature(s) Signed: 06/01/2023 5:23:50 PM By: Wyn Iha Entered By: Wyn Iha on 05/27/2023 16:03:17 -------------------------------------------------------------------------------- Wound Assessment Details Patient Name: Date of Service: [name], [name] 05/27/2023 2:45 PM Medical Record Number: [id number] Patient Account Number: [id number] Date of Birth/Sex: Treating RN: 1945-07-19 (78 y.o. M) Primary Care Yusra Ravert: Claudene Salena RAMAN Other Clinician: Lanza, Caillou ([id number]) [id number] Page 13 of 16 Referring Dael Howland: Treating Mallisa Alameda/Extender: Rosan Harlene Claudene Salena RAMAN Weeks in Treatment: 73 Wound Status Wound Number: 17 Primary Lymphedema Etiology: Wound Location: Right, Anterior Lower Leg Wound Open Wounding Event: Gradually Appeared Status: Date Acquired: 05/20/2023 Comorbid Anemia, Lymphedema, Deep Vein Thrombosis, Hypertension, Weeks Of Treatment: 0 History: Received Radiation Clustered Wound: No Photos Wound Measurements Length: (cm) 3 Width: (cm) 2 Depth: (cm) 0.1 Area: (cm) 4.712 Volume: (cm) 0.471 % Reduction in Area: % Reduction in Volume: Epithelialization: None Tunneling: No Undermining: No Wound Description Classification: Full Thickness Without Exposed Support Exudate Amount: Large Exudate Type: Serosanguineous Exudate Color: red, brown Structures Foul Odor After Cleansing: Yes Due to Product Use: No Slough/Fibrino Yes Wound Bed Granulation Amount: None Present (0%) Exposed Structure Necrotic Amount: Large (67-100%) Fascia Exposed: No Necrotic Quality: Adherent Slough Fat Layer (Subcutaneous Tissue) Exposed: Yes Tendon Exposed: No Muscle Exposed: No Joint Exposed: No Bone Exposed: No Periwound Skin Texture Texture Color No Abnormalities Noted: No No Abnormalities Noted: No Callus: No Atrophie Blanche: No Crepitus: No Cyanosis:  No Excoriation: No Ecchymosis: No Induration: No Erythema: No Rash: No Hemosiderin Staining: No Scarring: No Mottled: No Pallor: No Moisture Rubor: No No Abnormalities Noted: No Dry / Scaly: No Maceration: No Electronic Signature(s) Signed: 06/01/2023 5:23:50 PM By: Wyn Iha Entered By: Wyn Iha on 05/27/2023 16:02:13 Sobh, SALOMON ([id number]) [id number] Page 14 of 16 -------------------------------------------------------------------------------- Wound Assessment Details Patient Name: Date of Service: Strey, [name] 05/27/2023 2:45 PM Medical Record Number: [id number] Patient Account Number: [id number] Date of Birth/Sex: Treating RN: [birth year] (78 y.o. M) Primary Care Taray Normoyle: Claudene Salena RAMAN Other Clinician: Referring Nichola Cieslinski: Treating Emanuele Mcwhirter/Extender: Rosan Harlene Claudene Salena RAMAN Weeks in Treatment: 26 Wound Status Wound Number: 6 Primary Lymphedema Etiology: Wound Location: Right, Medial Lower Leg Wound Open Wounding Event: Gradually Appeared Status: Date Acquired: 03/05/2022 Comorbid Anemia, Lymphedema, Deep  Vein Thrombosis, Hypertension, Weeks Of Treatment: 61 History: Received Radiation Clustered Wound: Yes Photos Wound Measurements Length: (cm) Width: (cm) Depth: (cm) Clustered Quantity: Area: (cm) Volume: (cm) 13.5 % Reduction in Area: -63.2% 22 % Reduction in Volume: -226.4% 0.2 Epithelialization: None 1 Tunneling: No 233.263 Undermining: No 46.653 Wound Description Classification: Full Thickness Without Exposed Sup Wound Margin: Distinct, outline attached Exudate Amount: Large Exudate Type: Serous Exudate Color: amber port Structures Foul Odor After Cleansing: No Slough/Fibrino Yes Wound Bed Granulation Amount: None Present (0%) Exposed Structure Necrotic Amount: Large (67-100%) Fascia Exposed: No Necrotic Quality: Adherent Slough Fat Layer (Subcutaneous Tissue) Exposed: Yes Tendon  Exposed: No Muscle Exposed: No Joint Exposed: No Bone Exposed: No Periwound Skin Texture Texture Color No Abnormalities Noted: No No Abnormalities Noted: No Callus: No Atrophie Blanche: No Crepitus: No Cyanosis: No Excoriation: No Ecchymosis: No Induration: No Erythema: No Rash: No Hemosiderin Staining: Yes Scarring: Yes Mottled: No Pallor: No Moisture Rubor: No No Abnormalities Noted: No Dry / Scaly: No Temperature / Pain Maceration: No Temperature: No Abnormality Electronic Signature(s) Signed: 06/01/2023 5:23:50 PM By: Wyn Iha Entered By: Wyn Iha on 05/27/2023 16:03:02 Kroeger, SALOMON ([id number]) [id number] Page 15 of 16 -------------------------------------------------------------------------------- Wound Assessment Details Patient Name: Date of Service: Lien, [name] 05/27/2023 2:45 PM Medical Record Number: [id number] Patient Account Number: [id number] Date of Birth/Sex: Treating RN: 1945-09-16 (78 y.o. M) Primary Care Keiva Dina: Claudene Salena RAMAN Other Clinician: Referring Alyssha Housh: Treating Laela Deviney/Extender: Rosan Harlene Claudene Salena RAMAN Weeks in Treatment: 23 Wound Status Wound Number: 7 Primary Lymphedema Etiology: Wound Location: Left, Circumferential Lower Leg Wound Open Wounding Event: Gradually Appeared Status: Date Acquired: 03/05/2022 Comorbid Anemia, Lymphedema, Deep Vein Thrombosis, Hypertension, Weeks Of Treatment: 61 History: Received Radiation Clustered Wound: Yes Photos Wound Measurements Length: (cm) Width: (cm) Depth: (cm) Clustered Quantity: Area: (cm) Volume: (cm) 11 % Reduction in Area: -871.7% 53 % Reduction in Volume: -871.8% 0.1 Epithelialization: Small (1-33%) 1 Tunneling: No 457.887 Undermining: No 45.789 Wound Description Classification: Full Thickness Without Exposed Sup Wound Margin: Distinct, outline attached Exudate Amount: Large Exudate Type: Serous Exudate Color:  amber port Structures Foul Odor After Cleansing: No Slough/Fibrino Yes Wound Bed Granulation Amount: Small (1-33%) Exposed Structure Granulation Quality: Red Fascia Exposed: No Necrotic Amount: Large (67-100%) Fat Layer (Subcutaneous Tissue) Exposed: Yes Necrotic Quality: Adherent Slough Tendon Exposed: No Muscle Exposed: No Joint Exposed: No Bone Exposed: No Periwound Skin Texture Texture Color No Abnormalities Noted: No No Abnormalities Noted: No Callus: No Atrophie Blanche: No Crepitus: No Cyanosis: No Excoriation: No Ecchymosis: No Induration: No Erythema: No Rash: No Hemosiderin Staining: Yes Scarring: Yes Mottled: No Pallor: No Moisture Rubor: No No Abnormalities Noted: No Mettler, Agnes ([id number]) [id number] Page 16 of 16 Dry / Scaly: No Temperature / Pain Maceration: No Temperature: No Abnormality Electronic Signature(s) Signed: 06/01/2023 5:23:50 PM By: Wyn Iha Entered By: Wyn Iha on 05/27/2023 16:04:04 -------------------------------------------------------------------------------- Vitals Details Patient Name: Date of Service: Brander, [name] 05/27/2023 2:45 PM Medical Record Number: [id number] Patient Account Number: [id number] Date of Birth/Sex: Treating RN: 03-10-1946 (78 y.o. M) Primary Care Srijan Givan: Claudene Salena RAMAN Other Clinician: Referring [place]: Treating Tekeyah Santiago/Extender: Rosan Harlene Claudene Salena RAMAN Weeks in Treatment: 32 Vital Signs Time Taken: 15:39 Temperature (F): 98 Height (in): 74 Pulse (bpm): 90 Weight (lbs): 250 Respiratory Rate (breaths/min): 18 Body Mass Index (BMI): 32.1 Blood Pressure (mmHg): 163/64 Reference Range: 80 - 120 mg / dl Electronic Signature(s) Signed: 06/01/2023 5:23:50 PM By: Wyn Iha Entered By: Wyn Iha on 05/27/2023 15:39:47

## 2023-06-09 ENCOUNTER — Encounter (HOSPITAL_BASED_OUTPATIENT_CLINIC_OR_DEPARTMENT_OTHER): Payer: Medicare Other | Admitting: Internal Medicine

## 2023-06-09 DIAGNOSIS — L97828 Non-pressure chronic ulcer of other part of left lower leg with other specified severity: Secondary | ICD-10-CM

## 2023-06-09 DIAGNOSIS — I87333 Chronic venous hypertension (idiopathic) with ulcer and inflammation of bilateral lower extremity: Secondary | ICD-10-CM

## 2023-06-09 DIAGNOSIS — L97818 Non-pressure chronic ulcer of other part of right lower leg with other specified severity: Secondary | ICD-10-CM

## 2023-06-09 DIAGNOSIS — I890 Lymphedema, not elsewhere classified: Secondary | ICD-10-CM

## 2023-06-09 NOTE — Progress Notes (Signed)
Kubisiak, [name] ([id number]) [id number] Page 1 of 15 Visit Report for 06/09/2023 Chief Complaint Document Details Patient Name: Date of Service: [name], [name] 06/09/2023 3:15 PM Medical Record Number: [id number] Patient Account Number: [id number] Date of Birth/Sex: Treating RN: 1946-04-09 (78 y.o. M) Primary Care Provider: [name] Other Clinician: Referring Provider: Treating Provider/Extender: Zenda Alpers Weeks in Treatment: 75 Information Obtained from: Patient Chief Complaint 02/23/2020; patient is here for wounds on his bilateral lower legs in the setting of severe lymphedema 03/26/2022; patient is here for wounds on his bilateral lower legs in the setting of severe lymphedema Electronic Signature(s) Signed: 06/09/2023 3:49:44 PM By: Geralyn Corwin DO Entered By: Geralyn Corwin on 06/09/2023 15:41:30 -------------------------------------------------------------------------------- HPI Details Patient Name: Date of Service: [name], [name] 06/09/2023 3:15 PM Medical Record Number: [id number] Patient Account Number: [id number] Date of Birth/Sex: Treating RN: 27-Mar-1946 (78 y.o. M) Primary Care Provider: [name] Other Clinician: Referring Provider: Treating Provider/Extender: Zenda Alpers Weeks in Treatment: 18 History of Present Illness HPI Description: ADMISSION 02/23/2020 Patient is a 78 year old man who lives in [place] who arrives accompanied by his wife. He has a history of chronic lymphedema and venous insufficiency in his bilateral lower legs which may have something to do that with having a history of DVT as well as being treated for prostate cancer. In any case he recently got compression pumps at home but compliance has been an issue here. He has compression stockings however they are probably not sufficient enough to control swelling. They tell us that things deteriorated for him  in late August he was admitted to [facility] for 7 days. This was with cellulitis I think of his bilateral lower legs. Discharge he was noted to have wounds on his bilateral lower legs. He was discharged on Bactrim. They tried to get him home health through [facility] of course they declined him. His wife is been wrapping these applying some form of silver foam dressing. He has a history of wounds before although nothing that would not heal with basic home topical dressings. He has 2 areas on the left medial, left anterior and left lateral and a smaller area on the right medial. All of these have considerable depth. Past medical history includes iron deficiency anemia, lymphedema followed by the rehab center at [facility] with lymphedema wraps I believe, DVT on chronic anticoagulation, prostate cancer, chronic venous insufficiency, hypertension. As mentioned he has compression pumps but does not use them. ABIs in our clinic were noncompressible bilaterally 10/14; patient with severe bilateral lymphedema right greater than left. He came in with bilateral lower extremity wounds left greater than right. Even though the right side has more of the edema most of the wounds here almost closed on the right medial. He has 3 remaining wounds on the left We have been using silver alginate under 4-layer compression I have been trying to get him to be compliant with his external compression pumps 10/21; patient with 3 small wounds on the left leg and 1 on the right medial in the setting of severe lymphedema and chronic venous insufficiency. We have been using silver alginate under 4-layer compression he is using his external compression pumps twice a day 11/4; ARTERIAL STUDIES on the right show an ABI of 1.02 TBI of 0.858 with biphasic waveforms on the left 0.98 with a TBI of 0.55 and biphasic waveforms. Does not look like he has significant arterial disease. We are treating  him for  lymphedema he has compression pumps. He has punched-out areas on the left anterior left lateral and right medial lower extremities 11/11; after we obtained his arterial studies I put him in 4 layer compression. He is using his compression pumps probably once a day although I have asked Kleinert, Michelle ([id number]) [id number] Page 2 of 15 him to do twice. Primary dressing to the wound is silver collagen he has severe lymphedema likely secondary to chronic venous insufficiency. Wounds on the left lateral, left medial and left anterior and a small area on the right medial 12/2; the area on the right anterior lower leg has healed. We initially thought that the area medially had healed as well however when her discharge nurse came in she detected fluid in the wound simply opened up. This is actually worse than I remember this pain. The area on the left lateral potentially slightly smaller He is also complaining about pain in his left hand he says that this is actually been getting some better he has been using topical creams on this. She asked that I look at this 12/9 after last weeks issues we have 2 wounds one on the right medial lower leg and 1 on the left lateral. Both of these are in the same condition. I think because of thickened skin secondary to chronic lymphedema these wounds actually have depth of almost 0.8 cm. 12/16; the patient has 2 small but deep wounds one on the right medial and one on the left lateral. The right medial is actually the worst of these. He arrives in clinic today with absolutely terrible edema in the right leg apparently his 4-layer wrap fell down to just above his ankle he did not think about this he is apparently been continuing to use his compression pump twice a day. The left leg looks a lot better. 05/09/2020 upon evaluation today patient appears to be doing decently well in regard to his wounds. Everything is measuring smaller the right leg  still has a little bit deeper wound in the left seems to be almost completely healed in my opinion I am very pleased in general with how things are progressing. He has a 4- layer compression wrap we have been using endoform today we will probably have to use collagen just based on the fact that we do not have endoform it is on order. 1/6; the patient's wound on the left lateral lower leg has healed. Still has 1 on the right medial. 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 Using compression pumps at home twice a day. 1/13; left lateral lower leg is still healed. He has a deep punched out rectangular shaped wound on the right medial calf. Looking down at this it appears that he is attempting to epithelialize around the edges of the wound and on the base as well. His edema is reasonably well controlled we have been using collagen with absolutely no effect 1/20; left lateral lower leg remains closed he has extremitease stockings. The area on the right medial calf I aggressively debrided last week measures larger but the surface looks better. We have been using Hydrofera Blue. We ran Oasis through his insurance but we have not seen the results of this 1/27; left lower leg wound with chronic venous insufficiency and secondary lymphedema. I did aggressive debridement on this last week the wound seems to have come in healthy looking surface using Hydrofera Blue. He was denied for Oasis 2/3; small divot in the right medial  lower leg. Under illumination the walls of this divot are epithelialized however the base has slough which I removed with a curette we have been using Hydrofera Blue 2/10 small divot on the right medial lower leg pinpoint illumination at the base of this cone-shaped wound. We have been using Hydrofera Blue but I will switch to calcium alginate this week 2/17; the small divot on the right medial lower leg is fully epithelialized. There is no visible open area under  illumination. He has his own stocking for the right leg similar to the one he has been wearing on the left. 03/26/2022; READMISSION This is a now 78 year old man that we had in the clinic from 02/23/2020 through 07/05/2020. At that point he had bilateral lower extremity wounds left greater than right in the setting of severe lymphedema. He had already obtained compression pumps ordered for him I think from the wound care clinic in [facility] so I do not really have record of what he has been using. He claims to be using them once a day but there is a problem with the sleeve on the left leg. About 2 weeks ago he was hospitalized from 03/11/2022 through 03/14/2022 with diastolic congestive heart failure. 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 He was diuresed. Developed some prerenal azotemia and he has not been taking any diuretics currently. He has not been putting stockings on his legs since he got out of hospital and still has his legs dependent for long periods. Past medical history history of prostate cancer treated with prostatectomy and radiation this was apparently about 8 years ago, history of DVT on chronic Coumadin, history of lymphedema was managed for a while at the clinic in [place]. History of inguinal hernia repair in September 22, hypertension, stage IIIb chronic renal failure ABIs today were noncompressible on the right 1.12 on the left 04-02-2022 upon evaluation today patient appears to be doing well currently in regard to his legs I do feel like both areas that are draining are actually much drier than they were in the picture last week although the left is drier than the right. He is tolerating the 4-layer compression wraps at this point he did contact the pump company and they are actually working on getting him a new compression sleeve for one of his legs which have previously popped and was not functioning  properly. 04-23-2022 upon evaluation today patient appears to be doing well currently in regard to his wounds on the legs. I am actually very pleased with where things stand and I do feel like that we are headed in the right direction. Fortunately there is no sign of active infection locally or systemically at this time. 05-07-2022 upon evaluation today patient appears to be doing well currently in regard to his wounds in fact things are showing signs of improvement which is good news I do not see too much that actually appears to be open and I am very pleased in that regard. No fevers, chills, nausea, vomiting, or diarrhea. 05-21-2022 upon evaluation today patient appears to be doing somewhat poorly in regard to drainage of his lower extremities bilaterally. The right is greater than left as far as the weeping area. Nonetheless it seems to be getting worse not better. He actually has pitting edema which is at least 2+ to the thighs and I am concerned about the fact that he is may be fluid overloaded in general and that is the  reason why we cannot get this under control. I know he is not using his pumps all the time because he actually told the nurse that he was either going to pump or he was going to use his fluid pills but not do both. For that reason I do think that he needs to be really doing both in order to get the fluid out as effectively as possible obviously with the 4-layer compression wraps were doing as much as we can from a compression standpoint but it is really not enough. He tells me that he elevates his leg is much as he can in between pumping and other activity throughout the day. 05-28-2022 upon evaluation today patient appears to be doing better in regard to his wounds although the measurements may be a little bit larger this is a very difficult wound to heal it is very indistinct in a lot of areas. Nonetheless there is can be some need for sharp debridement in regard to both medial and  lateral legs. Fortunately I see no signs of active infection locally nor systemically at this time. No fevers, chills, nausea, vomiting, or diarrhea. 06-04-2022 upon evaluation today patient appears to be doing poorly in general in regard to the wounds on his legs. He still continues to have a tremendous amount of fluid not just in the lower portion of his leg but to be honest his thigh where he has 2-3+ pitting edema in the thigh as well. Unfortunately I do not know that we will be able to get this healed effectively and keep it healed on the lower extremities unless he gets the overall fluid situation taking and under control. Fortunately I do not see any signs of infection locally nor systemically which is great news. He just seems to be very fluid overloaded. 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 He has been tolerating the dressing changes with the compression wraps without complication. Fortunately I do not see any evidence of infection at this time which is great news. No fevers, chills, nausea, vomiting, or diarrhea. 06-18-2022 upon evaluation today patient appears to be doing well currently in regard to his wounds as far as not looking like they are terribly infected but nonetheless I am concerned about a subacute infection secondary to the fact that he continues to have spreading despite the compression therapy. We actually did do an Unna boot on him last week this is actually the first wrap that actually stayed up everything else has been sliding down quite significantly. Fortunately there does not appear to be any signs of infection systemically at this time. With that being said I do believe that locally there seems to be an issue going on here and again I Ernie Hew do a PCR culture to see what that shows also think that I am going to put him on a broad-spectrum  antibiotic, doxycycline to see how that will help as well. He does tell me that coming into the clinic today that he was feeling short of breath like "he was about to have a heart attack" because he was having such a hard time breathing. He says that he told this to Dr. Jodelle Green his cardiologist as well when he was evaluated in the [facility] ([id number]) [phone].pdf Page 3 of 15 past 1 to 2 weeks. 07-02-2022 upon evaluation today patient appears to be doing poorly currently in regard to his wound. He has been tolerating the  dressing changes. Unfortunately he has not had any compression wraps on for the past week because he was unable to make it in for his appointment last week. With that being said he has a significant amount of drainage he tells me has been using his pumps but despite this in the pumps he still has been draining quite a bit. The drainage is also somewhat purulent unfortunately. We did attempt to get in touch with his cardiologist last week unfortunately we were unable to get up with him I did advise that the patient needs to get in touch with him upon leaving today in order to make sure they know he is on the new antibiotics I am going to send him this will be Levaquin and Augmentin. 07-09-2022 upon evaluation today patient appears to be doing about the same in regard to his legs he may have just a slight amount of improvement with regard to the drainage probably Keystone topical antibiotics are helping in this regard to some degree. Fortunately there does not appear to be any signs of active infection systemically which is great news. No fevers, chills, nausea, vomiting, or diarrhea. 07-16-2022 upon evaluation today patient appears to be doing well currently in regard to his wound. He has been tolerating the dressing changes without complication. Fortunately there does not appear to be any signs of active infection locally nor systemically at this time.  With that being said he cannot keep the wraps up he tells me on the left side he had to cut this down because it got too tight. He has been using his pumps but he is on the right side the wrap actually straight down causing some pushing around the central part of his leg just below the calf I think this is a bigger risk for him that help at this point. I think that we may need to try something different he should be getting his compression socks shortly he tells me they were ordered last Thursday. 3/6; ; this is a patient who lives in [place]. He has severe bilateral lymphedema. He has compression pumps, we have been using kerlix Ace wrap Keystone. He is changing the dressing. We do not have home health. 08-06-2022 upon evaluation today patient appears to be doing a little better in regard to his wounds in general at this point. Fortunately there does not appear to be any signs of active infection locally nor systemically at this time which is great news and overall I am extremely pleased with where we stand today. 08-13-2022 upon evaluation today patient appears to actually be doing significantly better compared to last week. He actually did go to the hospital I told him that he needed to when he left here and he actually did go. With that being said they actually ended up admitting him he was having shortness of breath and I thought it might be related to congestive heart failure turns out he actually had a pulmonary embolism. Subsequently they were able to get him off of the Coumadin switching over to Eliquis to get things stabilized in that regard they also had them wrapped and got his swelling under control on his legs he actually looks much better pretty much across the board at this point. I am very pleased in that regard. With that being said I am very happy that he finally went that could have been a very dangerous situation. 08-20-2022 upon evaluation today patient appears to be doing well  currently in regard to his  wound. Has been tolerating the dressing changes without complication. Fortunately there does not appear to be any signs of active infection locally nor systemically at this time. I think his legs are doing better there is some need for sharp debridement today. 08-27-2022 upon evaluation patient is actually making excellent progress. I am actually very pleased with where he stands and I think that he is moving in the right direction. In general I think that we are looking pretty good at the moment. 09-03-2022 upon evaluation today patient appears to be doing well currently in regard to his wound. He is actually tolerating dressing changes on the left and right leg without complication. Fortunately I do not see any need for debridement of the left leg the right leg I think we probably do some need to perform some debridement here. 09-10-2022 upon evaluation today patient's wounds actually showed signs of improvement in both legs I do not see much is going require debridement today which was great news. Fortunately I do not see any evidence of infection which I think is also excellent he seems to be using his pumps and doing everything right I am happy about how this is progressing at this point. 09-17-2022 upon evaluation today patient appears to be doing decently well in regard to his wounds. He has been tolerating the dressing changes without complication. Fortunately there does not appear to be any signs of active infection at this time which is good news. 09-24-2022 upon evaluation today patient appears to be doing well currently in regard to his wounds. He has been tolerating the dressing changes without complication. Fortunately there does not appear to be any signs of active infection locally nor systemically which is great news. No fevers, chills, nausea, vomiting, or diarrhea. 10-08-2022 upon evaluation today patient appears to be doing excellent currently in regard to his  wound. He has been tolerating the dressing changes without complication though it does not sound like he has been using his compression wraps for a bit here. He does think he was doing better with the [facility] topical antibiotics we can definitely go back to using that but I think the biggest issue here is that his swelling is just very out of control and needs to be under control. I discussed that with him today. 10-15-2022 upon evaluation today patient appears to be doing well currently in regard to his wounds. He is actually making some progress which is good news. Fortunately I do not see any evidence of active infection locally nor systemically which is great news as well. No fevers, chills, nausea, vomiting, or diarrhea. He does have a callused area on the plantar aspect of his left foot which is actually causing some pain and he wonders if I can trim this down for him. 10-22-2022 upon evaluation today patient seems to be making progress. He is actually doing quite well and very pleased in that regard. I do not see any signs of active infection at this time. 11-05-2022 upon evaluation patient appears to be making progress although slowly towards closure. He seems to be doing well with regard to his legs were still using the [facility] topical antibiotics and he seems to be doing quite well. He is going require some sharp debridement today. 11-19-2022 upon evaluation today patient unfortunately appears to be extremely swollen at this point. He tells me that he ran out of supplies he also tells me his leg started leaking more because of not having supplies he was unable to wear  his compression wraps that is the juxta fit compression wraps. Therefore his legs are extremely swollen much larger than normal and do not appear to be doing well at all today. He is going require some debridement I also think he is going require [place] to perform compression wrapping today. 11-26-2022 upon evaluation today patient  appears to be doing well currently in regard to his legs as far as infection is concerned I see nothing that appears to be infected. Fortunately I do not see any signs of active infection systemically either which is also good news. With that being said he still is extremely swollen as far as his legs are concerned. I do not see any signs of overall worsening but also do not see any signs of significant improvement which is the major issue here. 12-03-2022 upon evaluation today patient appears to be doing poorly still in regard to his legs the apparently has been taken off the wraps and not leaving the week by week. With that being said he has not had really the dressings to put on the release that is running out and subsequently though he is using his wraps I am not sure that he has been keeping them on like he needs to. I explained to him I really wanted to have the wraps that I put on him left on until he comes back to see me so that we can keep the compression under better control. He voiced understanding today he tells me that he was "confused about that". Nonetheless based on what I am seeing right now also think that he is up on his feet too much I think he needs to get the feet elevated. 7/24; this is a patient with severe bilateral stage III lymphedema. On the right lower leg medially is a large area of macerated denuded skin was too small deeper areas in this. On the left leg he is 2 areas that are a little more standard in terms of wounds. Massive lymphedema bilaterally. He has compression pumps at home which he uses for 1 hour twice a day 12-17-2022 upon evaluation today patient's legs though a little bit smaller still continue to have significant issues with weeping here this just does not dry. I really think he needs to be changing this daily and this means we will probably have to try to go back to the Velcro wraps and give this a shot. I think may be going to the Velcro wraps, changing  the dressing to a superabsorber like XtraSorb or the like, and have him elevate his legs and do his pumps 3 times per day is Willems, Teion ([id number]) [id number] Page 4 of 15 probably going to be the way to go to try to see if we can make some improvement here. 12-24-2022 upon evaluation today patient unfortunately is still struggling to get anything under control. We have been extremely aggressive and trying to: Control his swelling everything we do however seems to be met with resistance. He tells me that he is pumping 3 times per day with his lymphedema pumps. He also tells me that he is try to elevate his legs is much as possible and that subsequently he has been keeping his compression wraps on. He goes back and forth between saying that he can keep our wraps on and not but right now I really do not see that he is doing well with the juxta fit is at least not from the standpoint of improving the overall appearance I  think it does to some degree help maintain but we are still struggling here. I have voiced a concern to the patient that despite everything you are doing I am still not seeing a lot of improvement here and to be honest I am not sure what is can take get this under control he may have to go into the hospital for admission. 12-31-2022 upon evaluation today patient appears to be doing well currently in regard to his wounds. They seem a little better although he has several pustules around the legs in general but has been concerned about infection. I am actually going to go ahead and see about getting an antibiotic sent into the pharmacy today although I am going to obtain a PCR culture as well. 01-07-2023 upon evaluation today patient appears to be doing well currently in regard to his wound. Has been tolerating the dressing changes without complication. He actually seems to be doing much better. He did have a PCR culture which showed Serratia as well and the  prominent organisms here. Levaquin was actually a good option to treat the Serratia along with the other organisms that were problematic including E. coli. Nonetheless I think coupled with the topical Keystone antibiotics and the alginate he is really doing quite well today. 8/28; patient with severe bilateral lymphedema he has wounds bilaterally on his lower legs but they look a lot better today. We are using silver alginate and Keystone with Urgo K2 lite wraps. He comes in today with his wounds looking quite good. He was prescribed Levaquin last time I think he will complete this in a few days. 01-21-2023 upon evaluation today patient appears to be doing worse in regard to his swelling took his wrap off he tells me he believes on Saturday. With that being said his leg is significantly swollen since that time and unfortunately does not appear to be doing nearly as good as it was last time I saw him 2 weeks ago or even last week for that matter. I am very concerned about this. 01-28-2023 upon evaluation today patient appears to be doing poorly currently in regard to his legs. He has been doing everything he says that we have recommended, he continues to use his lymphedema pumps 3 times a day, he has been doubling up on his fluid pills, he has been elevating his legs, and he tells me that he also has kept the wraps on and in fact he did have them on the day when he came in. Again that for feels the compression side of things. Nonetheless with everything going on here we still have not been able to get this completely under control. He continues to have issues with significant swelling of lower extremities and this is not limited to just his lower left portion of his legs but his thighs are fairly large as well. With that being said I really feel like that we have reached the extent of what we have to offer here at the wound care center I am wondering if we can see about making a referral for  second opinion to [facility] to see if there is anything they could do to help him out at this point. It is really not much further from his home to there as it is from his home to here. Both are about an hour distance. He lives in [place]. 02-04-2023 upon evaluation today patient appears to be doing much better actually compared to last week's evaluation. This is actually  significant improvement and overall I do not know exactly what he did different other than he tells me that he "has been taking his fluid pills every day instead of every other day. Obviously this is needed. I was under the understanding previously that he was taking this every day but I think he may also not been pumping every day like he was telling me but I cannot be sure this either way he does seem to be doing what he supposed to do at this point. 02-11-2023 upon evaluation today patient unfortunately appears to be about as bad today as he appeared good last week. I am not sure what happened other than the fact he did take his wrap off the right leg he tells me it got so tight that he could not feel his toes they are getting very numb. Subsequently he tells me that it was difficulty with driving therefore he took it off. With that being said I do believe that he may need to go to the ER for evaluation and treatment I feel he may need to be admitted to get some of the fluid off of his legs. I think that he is volume overloaded and at this point I do not think there is much working to be able to do to manage this outpatient we have tried pretty much everything that I am going to do. 02-18-23 upon evaluation today patient appears to be doing pretty well currently compared to last week this is actually better today. He is less weeping and the legs are actually significantly smaller. He tells me has been trying to stay in bed with his legs elevated more. 02-25-2023 upon evaluation today patient appears to be doing well  currently in regard to his legs in general compared to where we have been. Fortunately I do not see any signs of active infection locally or systemically which is great news and in general I do believe that we are making good headway here towards complete closure. I am hopeful that he will continue to show signs of improvement as we progress going forward. He does seem to have been taking his fluid pills as well as elevating his legs and using lymphedema pumps on a regular basis. He is trying to stay out of the hospital. 03-04-2023 upon evaluation today patient appears to be doing well currently in regard to his wound. He has been tolerating the dressing changes without complication. Fortunately there does not appear to be any signs of active infection at this time. 03-11-2023 upon evaluation today patient appears to be doing well currently in regard to his wounds. He has been tolerating the dressing changes without complication. Fortunately I do not see any signs of active infection at this time which is great news. 11/6; patient missed his clinic appointment last week. He took his wraps off at some point. Did not take his diuretics because he was nauseated last week. He comes in with massive bilateral edema which is lymphedema and probably some degree of systemic fluid overload. 11/13; patient with very poorly controlled lymphedema. We have been putting 4-layer equivalent wraps on him he says he is using his compression pumps twice a day. And elevating his legs nevertheless the edema control today is very poor. He has a substantial wound on the right medial lower leg also of the left medial and left posterior. We have been using alginate as a primary dressing or Aquacel Ag 11/20; some improvement in the amount of edema. We have  been using 4-layer equivalent wraps and he is pumping with his external compression pumps twice a day. His legs had improved in terms of edema although there is still  massively edematous. He tells me that he is on torsemide 1-1/2 tablets 20 mg tabs a day 12/4; massive edema extending up into his groin. Interestingly his edema in the thighs is more pitting then nonpitting lymphedema. His wounds are on both legs. This is not something that I think we can debride although I did do this last week I am not sure this did him any good. We have been using 4-layer compression he is using his pumps twice a day. I misunderstood his torsemide dosage which apparently is 1 tablet alternating with a half a tablet daily. I think he probably needs more than that. Unfortunately he cannot get a hold of his cardiologist... 12/11 massive edema in the bilateral lower legs extending up into his posterior thighs towards his groin. He tells me he took his compression wrap off on Sunday. He says he is using his external compression pumps twice a day. He has wounds predominantly on the right medial leg left lateral left anterior and left medial leg. The patient states he has tried to contact his cardiologist vis--vis diuretic use and he just cannot get a hold of anybody in the office 12/18; massive edema in his bilateral lower legs extending up into his groin. He has an elevated jugular venous pressure. He tells me his torsemide was increased to 30 mg he takes 130 mg tablet. He has wounds on the right medial left medial left anterior and left lateral lower legs they are covered in a nonviable surface but with this amount of edema debridement just is not indicated. He says he is using his compression pumps 3 times a day he had to cut down the wraps because they were too loose. 05/27/2023; patient presents for follow-up. He has not been here in 3 weeks. He states he is using his lymphedema pumps. He is using silver alginate to the wound beds under his Velcro compression wraps. 06/02/2023; patient presents for follow-up. At last clinic visit we restarted the compression wraps. We have been using  antibiotic ointment and antifungal cream Hiemstra, Lavarr ([id number]) [id number] Page 5 of 15 with silver alginate under 4-layer compression. He states he has had Keystone antibiotic ointment in the past and this has done well for him. He states he has been using his lymphedema pumps. Wounds have improved. 06/09/2023; patient presents for follow-up. We have been using antibiotic ointment and antifungal cream with silver alginate under compression therapy. Wound culture was done at last clinic visit that grew 11 different bacterial strains with the most predominant being peptoniphilus harei, serratia marcescens, e.coli and finegoldia magna. Keystone antibiotic spray has been ordered however patient has not received it yet. He states his compression pumps are not working anymore. Electronic Signature(s) Signed: 06/09/2023 3:49:44 PM By: Geralyn Corwin DO Entered By: Geralyn Corwin on 06/09/2023 15:49:03 -------------------------------------------------------------------------------- Physical Exam Details Patient Name: Date of Service: [name], [name] 06/09/2023 3:15 PM Medical Record Number: [id number] Patient Account Number: [id number] Date of Birth/Sex: Treating RN: Jan 15, 1946 (78 y.o. M) Primary Care Provider: [name] Other Clinician: Referring Provider: Treating Provider/Extender: Zenda Alpers Weeks in Treatment: 36 Constitutional respirations regular, non-labored and within target range for patient.. Cardiovascular 2+ dorsalis pedis/posterior tibialis pulses. Psychiatric pleasant and cooperative. Notes Scattered open wounds to the lower extremities bilaterally with non viable tissue and granulation tissue.  Stage IV lymphedema. No obvious signs of overt soft tissue infection. Electronic Signature(s) Signed: 06/09/2023 3:49:44 PM By: Geralyn Corwin DO Entered By: Geralyn Corwin on 06/09/2023  15:44:08 -------------------------------------------------------------------------------- Physician Orders Details Patient Name: Date of Service: [name], [name] 06/09/2023 3:15 PM Medical Record Number: [id number] Patient Account Number: [id number] Date of Birth/Sex: Treating RN: 12-24-1945 (78 y.o. 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 - +++ Extra Time++++Dr. Mikey Bussing Tuesday Return A Other: - take your fluid pills as prescribed. Speak with cardiologist about increasing fluid pills T wraps off in 1 wk, dress wounds yourself, wear stockings, continue to pump 3x day ake *****Call [facility] pharmacy to purchase topical compounding antibiotics***** *****Call your lymphedema pump company to get your sleeves Anesthetic (In clinic) Topical Lidocaine 4% applied to wound bed Bathing/ Shower/ Hygiene May shower with protection but do not get wound dressing(s) wet. Protect dressing(s) with water repellant cover (for example, large plastic bag) or a cast cover and may then take shower. Edema Control - Orders /  Instructions Segmental Compressive Device. Use the Segmental Compressive Device on leg(s) 2-3 times a day for 45 - 60 minutes. If wearing any wraps or hose, do not remove them. Continue exercising as instructed. - x3 a day Elevate legs to the level of the heart or above for 30 minutes daily and/or when sitting for 3-4 times a day throughout the day. Avoid standing for long periods of time. Exercise regularly Off-Loading Other: - When sitting please keep your feet up, keep legs at Heart Level or above. Use pillows behind calf to for comfort. Additional Orders / Instructions Follow Nutritious Diet - Try and increase protein intake. Goal of protein intake is 60g-100g. Lymphedema Treatment Plan - Exercise, Compression and Elevation Bilateral Lower Extremities Exercise daily as tolerated. (Walking, ROM, Calf Pumps and Toe Taps) Elevate legs 30 - 60 minutes at or above heart level at least 3 - 4 times daily as able/tolerated Avoid standing for long periods and elevate leg(s) parallel to the floor when sitting Use Pneumatic Compression Device on leg(s) 2-3 times a day for 45-60 minutes - x3 a day Wound Treatment Wound #17 - Lower Leg Wound Laterality: Right, Anterior Cleanser: Soap and Water Every Other Day/30 Days Discharge Instructions: May shower and wash wound with dial antibacterial soap and water prior to dressing change. Peri-Wound Care: Sween Lotion (Moisturizing lotion) Every Other Day/30 Days Discharge Instructions: Apply moisturizing lotion as directed Topical: Gentamicin Every Other Day/30 Days Discharge Instructions: As directed by physician Topical: Mupirocin Ointment Every Other Day/30 Days Discharge Instructions: Apply Mupirocin (Bactroban) as instructed Topical: Ketoconazole Cream 2% Every Other Day/30 Days Discharge Instructions: Apply Ketoconazole as directed Topical: Zinc Oxide Ointment 30g tube Every Other Day/30 Days Discharge Instructions: mix in with the other topical  medications Prim Dressing: Maxorb Extra CMC/Alginate Dressing, 4x4 (in/in) (Generic) Every  Other Day/30 Days ary Discharge Instructions: Apply to wound bed as instructed Secondary Dressing: ABD Pad, 8x10 Every Other Day/30 Days Discharge Instructions: Apply over primary dressing as directed. Secondary Dressing: Zetuvit Plus 4x8 in Every Other Day/30 Days Discharge Instructions: Apply over primary dressing as directed. Secondary Dressing: OptiLock Super Absorbent, 5x5.5 (in/in) (Generic) Every Other Day/30 Days Discharge Instructions: Apply directly to wound bed as directed Compression Wrap: Urgo K2, (equivalent to a 4 layer) two layer compression system, regular Every Other Day/30 Days Discharge Instructions: Apply Urgo K2 as directed (alternative to 4 layer compression). Wound #6 - Lower Leg Wound Laterality: Right, Medial Cleanser: Soap and Water Every Other Day/30 Days Discharge Instructions: May shower and wash wound with dial antibacterial soap and water prior to dressing change. Peri-Wound Care: Sween Lotion (Moisturizing lotion) Every Other Day/30 Days Discharge Instructions: Apply moisturizing lotion as directed Hoch, Bradly ([id number]) [id number] Page 7 of 15 Topical: Gentamicin Every Other Day/30 Days Discharge Instructions: As directed by physician Topical: Mupirocin Ointment Every Other Day/30 Days Discharge Instructions: Apply Mupirocin (Bactroban) as instructed Topical: Ketoconazole Cream 2% Every Other Day/30 Days Discharge Instructions: Apply Ketoconazole as directed Topical: Zinc Oxide Ointment 30g tube Every Other Day/30 Days Discharge Instructions: mix in with the other topical medications Prim Dressing: Maxorb Extra CMC/Alginate Dressing, 4x4 (in/in) (Generic) Every Other Day/30 Days ary Discharge Instructions: Apply to wound bed as instructed Secondary Dressing: ABD Pad, 8x10 Every Other Day/30 Days Discharge Instructions: Apply over  primary dressing as directed. Secondary Dressing: Zetuvit Plus 4x8 in Every Other Day/30 Days Discharge Instructions: Apply over primary dressing as directed. Secondary Dressing: OptiLock Super Absorbent, 5x5.5 (in/in) (Generic) Every Other Day/30 Days Discharge Instructions: Apply directly to wound bed as directed Compression Wrap: Urgo K2, (equivalent to a 4 layer) two layer compression system, regular Every Other Day/30 Days Discharge Instructions: Apply Urgo K2 as directed (alternative to 4 layer compression). Wound #7 - Lower Leg Wound Laterality: Left, Circumferential Cleanser: Soap and Water Every Other Day/30 Days Discharge Instructions: May shower and wash wound with dial antibacterial soap and water prior to dressing change. Peri-Wound Care: Sween Lotion (Moisturizing lotion) Every Other Day/30 Days Discharge Instructions: Apply moisturizing lotion as directed Topical: Gentamicin Every Other Day/30 Days Discharge Instructions: As directed by physician Topical: Mupirocin Ointment Every Other Day/30 Days Discharge Instructions: Apply Mupirocin (Bactroban) as instructed Topical: Ketoconazole Cream 2% Every Other Day/30 Days Discharge Instructions: Apply Ketoconazole as directed Topical: Zinc Oxide Ointment 30g tube Every Other Day/30 Days Discharge Instructions: mix in with the other topical medications Prim Dressing: Maxorb Extra CMC/Alginate Dressing, 4x4 (in/in) (Generic) Every Other Day/30 Days ary Discharge Instructions: Apply to wound bed as instructed Secondary Dressing: ABD Pad, 8x10 Every Other Day/30 Days Discharge Instructions: Apply over primary dressing as directed. Secondary Dressing: Zetuvit Plus 4x8 in Every Other Day/30 Days Discharge Instructions: Apply over primary dressing as directed. Secondary Dressing: OptiLock Super Absorbent, 5x5.5 (in/in) (Generic) Every Other Day/30 Days Discharge Instructions: Apply directly to wound bed as directed Compression Wrap:  Urgo K2, (equivalent to a 4 layer) two layer compression system, regular Every Other Day/30 Days Discharge Instructions: Apply Urgo K2 as directed (alternative to 4 layer compression). Electronic Signature(s) Signed: 06/09/2023 3:49:44 PM By: Geralyn Corwin DO Entered By: Geralyn Corwin on 06/09/2023 15:44:29 [place], [name] ([id number]) [id number] Page 8 of 15 -------------------------------------------------------------------------------- Problem List Details Patient Name: Date of Service: [name], [name] 06/09/2023 3:15 PM Medical Record Number: [id number] Patient Account Number: [id number] Date of Birth/Sex: Treating RN: [birth year] (77 y.o.  [name], Millard.Loa Primary Care Provider: [name] Other Clinician: Referring Provider: Treating Provider/Extender: Zenda Alpers Weeks in Treatment: 76 Active Problems ICD-10 Encounter Code Description Active Date MDM Diagnosis I87.333 Chronic venous hypertension (idiopathic) with ulcer and inflammation of 06/11/2022 No Yes bilateral lower extremity I89.0 Lymphedema, not elsewhere classified 03/26/2022 No Yes L97.828 Non-pressure chronic ulcer of other part of left lower leg with other specified 03/26/2022 No Yes severity L97.818 Non-pressure chronic ulcer of other part of right lower leg with other specified 03/26/2022 No Yes severity I50.42 Chronic combined systolic (congestive) and diastolic (congestive) heart failure 05/06/2023 No Yes Inactive Problems ICD-10 Code Description Active Date Inactive Date L84 Corns and callosities 10/15/2022 10/15/2022 Resolved Problems Electronic Signature(s) Signed: 06/09/2023 3:49:44 PM By: Geralyn Corwin DO Entered By: Geralyn Corwin on 06/09/2023 15:41:18 -------------------------------------------------------------------------------- Progress Note Details Patient Name: Date of Service: [name], [name] 06/09/2023 3:15 PM Medical Record Number:  [id number] Patient Account Number: [id number] Date of Birth/Sex: Treating RN: 1946/03/07 (78 y.o. M) Primary Care Provider: [name] Other Clinician: Referring Provider: Treating Provider/Extender: Zenda Alpers Weeks in Treatment: 32 Subjective Chief Complaint Information obtained from Patient 02/23/2020; patient is here for wounds on his bilateral lower legs in the setting of severe lymphedema 03/26/2022; patient is here for wounds on his bilateral lower [name], [name] ([id number]) [id number] Page 9 of 15 legs in the setting of severe lymphedema History of Present Illness (HPI) ADMISSION 02/23/2020 Patient is a 78 year old man who lives in [place] who arrives accompanied by his wife. He has a history of chronic lymphedema and venous insufficiency in his bilateral lower legs which may have something to do that with having a history of DVT as well as being treated for prostate cancer. In any case he recently got compression pumps at home but compliance has been an issue here. He has compression stockings however they are probably not sufficient enough to control swelling. They tell us that things deteriorated for him in late August he was admitted to [facility] for 7 days. This was with cellulitis I think of his bilateral lower legs. Discharge he was noted to have wounds on his bilateral lower legs. He was discharged on Bactrim. They tried to get him home health through [facility] of course they declined him. His wife is been wrapping these applying some form of silver foam dressing. He has a history of wounds before although nothing that would not heal with basic home topical dressings. He has 2 areas on the left medial, left anterior and left lateral and a smaller area on the right medial. All of these have considerable depth. Past medical history includes iron deficiency anemia, lymphedema followed by the rehab  center at [facility] with lymphedema wraps I believe, DVT on chronic anticoagulation, prostate cancer, chronic venous insufficiency, hypertension. As mentioned he has compression pumps but does not use them. ABIs in our clinic were noncompressible bilaterally 10/14; patient with severe bilateral lymphedema right greater than left. He came in with bilateral lower extremity wounds left greater than right. Even though the right side has more of the edema most of the wounds here almost closed on the right medial. He has 3 remaining wounds on the left We have been using silver alginate under 4-layer compression I have been trying to get him to be compliant with his external compression pumps 10/21; patient with 3 small wounds on the left leg and 1 on the right medial in  the setting of severe lymphedema and chronic venous insufficiency. We have been using silver alginate under 4-layer compression he is using his external compression pumps twice a day 11/4; ARTERIAL STUDIES on the right show an ABI of 1.02 TBI of 0.858 with biphasic waveforms on the left 0.98 with a TBI of 0.55 and biphasic waveforms. Does not look like he has significant arterial disease. We are treating him for lymphedema he has compression pumps. He has punched-out areas on the left anterior left lateral and right medial lower extremities 11/11; after we obtained his arterial studies I put him in 4 layer compression. He is using his compression pumps probably once a day although I have asked him to do twice. Primary dressing to the wound is silver collagen he has severe lymphedema likely secondary to chronic venous insufficiency. Wounds on the left lateral, left medial and left anterior and a small area on the right medial 12/2; the area on the right anterior lower leg has healed. We initially thought that the area medially had healed as well however when her discharge nurse came in she detected fluid in the wound simply opened up. This  is actually worse than I remember this pain. The area on the left lateral potentially slightly smaller He is also complaining about pain in his left hand he says that this is actually been getting some better he has been using topical creams on this. She asked that I look at this 12/9 after last weeks issues we have 2 wounds one on the right medial lower leg and 1 on the left lateral. Both of these are in the same condition. I think because of thickened skin secondary to chronic lymphedema these wounds actually have depth of almost 0.8 cm. 12/16; the patient has 2 small but deep wounds one on the right medial and one on the left lateral. The right medial is actually the worst of these. He arrives in clinic today with absolutely terrible edema in the right leg apparently his 4-layer wrap fell down to just above his ankle he did not think about this he is apparently been continuing to use his compression pump twice a day. The left leg looks a lot better. 05/09/2020 upon evaluation today patient appears to be doing decently well in regard to his wounds. Everything is measuring smaller the right leg still has a little bit deeper wound in the left seems to be almost completely healed in my opinion I am very pleased in general with how things are progressing. He has a 4- layer compression wrap we have been using endoform today we will probably have to use collagen just based on the fact that we do not have endoform it is on order. 1/6; the patient's wound on the left lateral lower leg has healed. Still has 1 on the right medial. 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 Using compression pumps at home twice a day. 1/13; left lateral lower leg is still healed. He has a deep punched out rectangular shaped wound on the right medial calf. Looking down at this it appears that he is attempting to epithelialize around the edges of the wound and on the base as well. His edema is reasonably well  controlled we have been using collagen with absolutely no effect 1/20; left lateral lower leg remains closed he has extremitease stockings. The area on the right medial calf I aggressively debrided last week measures larger but the surface looks better. We have been using Hydrofera  Blue. We ran Oasis through his insurance but we have not seen the results of this 1/27; left lower leg wound with chronic venous insufficiency and secondary lymphedema. I did aggressive debridement on this last week the wound seems to have come in healthy looking surface using Hydrofera Blue. He was denied for Oasis 2/3; small divot in the right medial lower leg. Under illumination the walls of this divot are epithelialized however the base has slough which I removed with a curette we have been using Hydrofera Blue 2/10 small divot on the right medial lower leg pinpoint illumination at the base of this cone-shaped wound. We have been using Hydrofera Blue but I will switch to calcium alginate this week 2/17; the small divot on the right medial lower leg is fully epithelialized. There is no visible open area under illumination. He has his own stocking for the right leg similar to the one he has been wearing on the left. 03/26/2022; READMISSION This is a now 78 year old man that we had in the clinic from 02/23/2020 through 07/05/2020. At that point he had bilateral lower extremity wounds left greater than right in the setting of severe lymphedema. He had already obtained compression pumps ordered for him I think from the wound care clinic in [facility] so I do not really have record of what he has been using. He claims to be using them once a day but there is a problem with the sleeve on the left leg. About 2 weeks ago he was hospitalized from 03/11/2022 through 03/14/2022 with diastolic congestive heart failure. 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 He was diuresed.  Developed some prerenal azotemia and he has not been taking any diuretics currently. He has not been putting stockings on his legs since he got out of hospital and still has his legs dependent for long periods. Past medical history history of prostate cancer treated with prostatectomy and radiation this was apparently about 8 years ago, history of DVT on chronic Coumadin, history of lymphedema was managed for a while at the clinic in [place]. History of inguinal hernia repair in September 22, hypertension, stage IIIb chronic renal failure ABIs today were noncompressible on the right 1.12 on the left 04-02-2022 upon evaluation today patient appears to be doing well currently in regard to his legs I do feel like both areas that are draining are actually much drier than they were in the picture last week although the left is drier than the right. He is tolerating the 4-layer compression wraps at this point he did contact the pump company and they are actually working on getting him a new compression sleeve for one of his legs which have previously popped and was not functioning properly. [name], [name] ([id number]) [id number] Page 10 of 15 04-23-2022 upon evaluation today patient appears to be doing well currently in regard to his wounds on the legs. I am actually very pleased with where things stand and I do feel like that we are headed in the right direction. Fortunately there is no sign of active infection locally or systemically at this time. 05-07-2022 upon evaluation today patient appears to be doing well currently in regard to his wounds in fact things are showing signs of improvement which is good news I do not see too much that actually appears to be open and I am very pleased in that regard. No fevers, chills, nausea, vomiting, or diarrhea. 05-21-2022 upon evaluation today patient appears  to be doing somewhat poorly in regard to drainage of his lower extremities  bilaterally. The right is greater than left as far as the weeping area. Nonetheless it seems to be getting worse not better. He actually has pitting edema which is at least 2+ to the thighs and I am concerned about the fact that he is may be fluid overloaded in general and that is the reason why we cannot get this under control. I know he is not using his pumps all the time because he actually told the nurse that he was either going to pump or he was going to use his fluid pills but not do both. For that reason I do think that he needs to be really doing both in order to get the fluid out as effectively as possible obviously with the 4-layer compression wraps were doing as much as we can from a compression standpoint but it is really not enough. He tells me that he elevates his leg is much as he can in between pumping and other activity throughout the day. 05-28-2022 upon evaluation today patient appears to be doing better in regard to his wounds although the measurements may be a little bit larger this is a very difficult wound to heal it is very indistinct in a lot of areas. Nonetheless there is can be some need for sharp debridement in regard to both medial and lateral legs. Fortunately I see no signs of active infection locally nor systemically at this time. No fevers, chills, nausea, vomiting, or diarrhea. 06-04-2022 upon evaluation today patient appears to be doing poorly in general in regard to the wounds on his legs. He still continues to have a tremendous amount of fluid not just in the lower portion of his leg but to be honest his thigh where he has 2-3+ pitting edema in the thigh as well. Unfortunately I do not know that we will be able to get this healed effectively and keep it healed on the lower extremities unless he gets the overall fluid situation taking and under control. Fortunately I do not see any signs of infection locally nor systemically which is great news. He just seems to be very  fluid overloaded. 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 He has been tolerating the dressing changes with the compression wraps without complication. Fortunately I do not see any evidence of infection at this time which is great news. No fevers, chills, nausea, vomiting, or diarrhea. 06-18-2022 upon evaluation today patient appears to be doing well currently in regard to his wounds as far as not looking like they are terribly infected but nonetheless I am concerned about a subacute infection secondary to the fact that he continues to have spreading despite the compression therapy. We actually did do an Unna boot on him last week this is actually the first wrap that actually stayed up everything else has been sliding down quite significantly. Fortunately there does not appear to be any signs of infection systemically at this time. With that being said I do believe that locally there seems to be an issue going on here and again I Ernie Hew do a PCR culture to see what that shows also think that I am going to put him on a broad-spectrum antibiotic, doxycycline to see how that will help as well. He does tell me that coming into the clinic today that he was feeling short of breath like "he was  about to have a heart attack" because he was having such a hard time breathing. He says that he told this to Dr. Jodelle Green his cardiologist as well when he was evaluated in the past 1 to 2 weeks. 07-02-2022 upon evaluation today patient appears to be doing poorly currently in regard to his wound. He has been tolerating the dressing changes. Unfortunately he has not had any compression wraps on for the past week because he was unable to make it in for his appointment last week. With that being said he has a significant amount of drainage he tells me has been using his pumps but despite this in the pumps he  still has been draining quite a bit. The drainage is also somewhat purulent unfortunately. We did attempt to get in touch with his cardiologist last week unfortunately we were unable to get up with him I did advise that the patient needs to get in touch with him upon leaving today in order to make sure they know he is on the new antibiotics I am going to send him this will be Levaquin and Augmentin. 07-09-2022 upon evaluation today patient appears to be doing about the same in regard to his legs he may have just a slight amount of improvement with regard to the drainage probably Keystone topical antibiotics are helping in this regard to some degree. Fortunately there does not appear to be any signs of active infection systemically which is great news. No fevers, chills, nausea, vomiting, or diarrhea. 07-16-2022 upon evaluation today patient appears to be doing well currently in regard to his wound. He has been tolerating the dressing changes without complication. Fortunately there does not appear to be any signs of active infection locally nor systemically at this time. With that being said he cannot keep the wraps up he tells me on the left side he had to cut this down because it got too tight. He has been using his pumps but he is on the right side the wrap actually straight down causing some pushing around the central part of his leg just below the calf I think this is a bigger risk for him that help at this point. I think that we may need to try something different he should be getting his compression socks shortly he tells me they were ordered last Thursday. 3/6; ; this is a patient who lives in [place]. He has severe bilateral lymphedema. He has compression pumps, we have been using kerlix Ace wrap Keystone. He is changing the dressing. We do not have home health. 08-06-2022 upon evaluation today patient appears to be doing a little better in regard to his wounds in general at this point.  Fortunately there does not appear to be any signs of active infection locally nor systemically at this time which is great news and overall I am extremely pleased with where we stand today. 08-13-2022 upon evaluation today patient appears to actually be doing significantly better compared to last week. He actually did go to the hospital I told him that he needed to when he left here and he actually did go. With that being said they actually ended up admitting him he was having shortness of breath and I thought it might be related to congestive heart failure turns out he actually had a pulmonary embolism. Subsequently they were able to get him off of the Coumadin switching over to Eliquis to get things stabilized in that regard they also had them wrapped and got his swelling  under control on his legs he actually looks much better pretty much across the board at this point. I am very pleased in that regard. With that being said I am very happy that he finally went that could have been a very dangerous situation. 08-20-2022 upon evaluation today patient appears to be doing well currently in regard to his wound. Has been tolerating the dressing changes without complication. Fortunately there does not appear to be any signs of active infection locally nor systemically at this time. I think his legs are doing better there is some need for sharp debridement today. 08-27-2022 upon evaluation patient is actually making excellent progress. I am actually very pleased with where he stands and I think that he is moving in the right direction. In general I think that we are looking pretty good at the moment. 09-03-2022 upon evaluation today patient appears to be doing well currently in regard to his wound. He is actually tolerating dressing changes on the left and right leg without complication. Fortunately I do not see any need for debridement of the left leg the right leg I think we probably do some need to perform  some debridement here. 09-10-2022 upon evaluation today patient's wounds actually showed signs of improvement in both legs I do not see much is going require debridement today which was great news. Fortunately I do not see any evidence of infection which I think is also excellent he seems to be using his pumps and doing everything right I am happy about how this is progressing at this point. 09-17-2022 upon evaluation today patient appears to be doing decently well in regard to his wounds. He has been tolerating the dressing changes without complication. Fortunately there does not appear to be any signs of active infection at this time which is good news. 09-24-2022 upon evaluation today patient appears to be doing well currently in regard to his wounds. He has been tolerating the dressing changes without complication. Fortunately there does not appear to be any signs of active infection locally nor systemically which is great news. No fevers, chills, nausea, vomiting, or diarrhea. 10-08-2022 upon evaluation today patient appears to be doing excellent currently in regard to his wound. He has been tolerating the dressing changes without complication though it does not sound like he has been using his compression wraps for a bit here. He does think he was doing better with the [facility] topical antibiotics we can definitely go back to using that but I think the biggest issue here is that his swelling is just very out of control and needs to be under control. I discussed that with him today. [name], [name] ([id number]) [id number] Page 11 of 15 10-15-2022 upon evaluation today patient appears to be doing well currently in regard to his wounds. He is actually making some progress which is good news. Fortunately I do not see any evidence of active infection locally nor systemically which is great news as well. No fevers, chills, nausea, vomiting, or diarrhea. He does have a callused  area on the plantar aspect of his left foot which is actually causing some pain and he wonders if I can trim this down for him. 10-22-2022 upon evaluation today patient seems to be making progress. He is actually doing quite well and very pleased in that regard. I do not see any signs of active infection at this time. 11-05-2022 upon evaluation patient appears to be making progress although slowly towards closure. He seems to be doing well with  regard to his legs were still using the [facility] topical antibiotics and he seems to be doing quite well. He is going require some sharp debridement today. 11-19-2022 upon evaluation today patient unfortunately appears to be extremely swollen at this point. He tells me that he ran out of supplies he also tells me his leg started leaking more because of not having supplies he was unable to wear his compression wraps that is the juxta fit compression wraps. Therefore his legs are extremely swollen much larger than normal and do not appear to be doing well at all today. He is going require some debridement I also think he is going require [place] to perform compression wrapping today. 11-26-2022 upon evaluation today patient appears to be doing well currently in regard to his legs as far as infection is concerned I see nothing that appears to be infected. Fortunately I do not see any signs of active infection systemically either which is also good news. With that being said he still is extremely swollen as far as his legs are concerned. I do not see any signs of overall worsening but also do not see any signs of significant improvement which is the major issue here. 12-03-2022 upon evaluation today patient appears to be doing poorly still in regard to his legs the apparently has been taken off the wraps and not leaving the week by week. With that being said he has not had really the dressings to put on the release that is running out and subsequently though he is using his  wraps I am not sure that he has been keeping them on like he needs to. I explained to him I really wanted to have the wraps that I put on him left on until he comes back to see me so that we can keep the compression under better control. He voiced understanding today he tells me that he was "confused about that". Nonetheless based on what I am seeing right now also think that he is up on his feet too much I think he needs to get the feet elevated. 7/24; this is a patient with severe bilateral stage III lymphedema. On the right lower leg medially is a large area of macerated denuded skin was too small deeper areas in this. On the left leg he is 2 areas that are a little more standard in terms of wounds. Massive lymphedema bilaterally. He has compression pumps at home which he uses for 1 hour twice a day 12-17-2022 upon evaluation today patient's legs though a little bit smaller still continue to have significant issues with weeping here this just does not dry. I really think he needs to be changing this daily and this means we will probably have to try to go back to the Velcro wraps and give this a shot. I think may be going to the Velcro wraps, changing the dressing to a superabsorber like XtraSorb or the like, and have him elevate his legs and do his pumps 3 times per day is probably going to be the way to go to try to see if we can make some improvement here. 12-24-2022 upon evaluation today patient unfortunately is still struggling to get anything under control. We have been extremely aggressive and trying to: Control his swelling everything we do however seems to be met with resistance. He tells me that he is pumping 3 times per day with his lymphedema pumps. He also tells me that he is try to elevate his legs is  much as possible and that subsequently he has been keeping his compression wraps on. He goes back and forth between saying that he can keep our wraps on and not but right now I really do not  see that he is doing well with the juxta fit is at least not from the standpoint of improving the overall appearance I think it does to some degree help maintain but we are still struggling here. I have voiced a concern to the patient that despite everything you are doing I am still not seeing a lot of improvement here and to be honest I am not sure what is can take get this under control he may have to go into the hospital for admission. 12-31-2022 upon evaluation today patient appears to be doing well currently in regard to his wounds. They seem a little better although he has several pustules around the legs in general but has been concerned about infection. I am actually going to go ahead and see about getting an antibiotic sent into the pharmacy today although I am going to obtain a PCR culture as well. 01-07-2023 upon evaluation today patient appears to be doing well currently in regard to his wound. Has been tolerating the dressing changes without complication. He actually seems to be doing much better. He did have a PCR culture which showed Serratia as well and the prominent organisms here. Levaquin was actually a good option to treat the Serratia along with the other organisms that were problematic including E. coli. Nonetheless I think coupled with the topical Keystone antibiotics and the alginate he is really doing quite well today. 8/28; patient with severe bilateral lymphedema he has wounds bilaterally on his lower legs but they look a lot better today. We are using silver alginate and Keystone with Urgo K2 lite wraps. He comes in today with his wounds looking quite good. He was prescribed Levaquin last time I think he will complete this in a few days. 01-21-2023 upon evaluation today patient appears to be doing worse in regard to his swelling took his wrap off he tells me he believes on Saturday. With that being said his leg is significantly swollen since that time and unfortunately does not  appear to be doing nearly as good as it was last time I saw him 2 weeks ago or even last week for that matter. I am very concerned about this. 01-28-2023 upon evaluation today patient appears to be doing poorly currently in regard to his legs. He has been doing everything he says that we have recommended, he continues to use his lymphedema pumps 3 times a day, he has been doubling up on his fluid pills, he has been elevating his legs, and he tells me that he also has kept the wraps on and in fact he did have them on the day when he came in. Again that for feels the compression side of things. Nonetheless with everything going on here we still have not been able to get this completely under control. He continues to have issues with significant swelling of lower extremities and this is not limited to just his lower left portion of his legs but his thighs are fairly large as well. With that being said I really feel like that we have reached the extent of what we have to offer here at the wound care center I am wondering if we can see about making a referral for second opinion to [facility] to see if there is  anything they could do to help him out at this point. It is really not much further from his home to there as it is from his home to here. Both are about an hour distance. He lives in [place]. 02-04-2023 upon evaluation today patient appears to be doing much better actually compared to last week's evaluation. This is actually significant improvement and overall I do not know exactly what he did different other than he tells me that he "has been taking his fluid pills every day instead of every other day. Obviously this is needed. I was under the understanding previously that he was taking this every day but I think he may also not been pumping every day like he was telling me but I cannot be sure this either way he does seem to be doing what he supposed to do at this point. 02-11-2023 upon  evaluation today patient unfortunately appears to be about as bad today as he appeared good last week. I am not sure what happened other than the fact he did take his wrap off the right leg he tells me it got so tight that he could not feel his toes they are getting very numb. Subsequently he tells me that it was difficulty with driving therefore he took it off. With that being said I do believe that he may need to go to the ER for evaluation and treatment I feel he may need to be admitted to get some of the fluid off of his legs. I think that he is volume overloaded and at this point I do not think there is much working to be able to do to manage this outpatient we have tried pretty much everything that I am going to do. 02-18-23 upon evaluation today patient appears to be doing pretty well currently compared to last week this is actually better today. He is less weeping and the legs are actually significantly smaller. He tells me has been trying to stay in bed with his legs elevated more. 02-25-2023 upon evaluation today patient appears to be doing well currently in regard to his legs in general compared to where we have been. Fortunately I do not see any signs of active infection locally or systemically which is great news and in general I do believe that we are making good headway here towards complete closure. I am hopeful that he will continue to show signs of improvement as we progress going forward. He does seem to have been taking his fluid pills as well as elevating his legs and using lymphedema pumps on a regular basis. He is trying to stay out of the hospital. 03-04-2023 upon evaluation today patient appears to be doing well currently in regard to his wound. He has been tolerating the dressing changes without complication. Fortunately there does not appear to be any signs of active infection at this time. [name], [name] ([id number]) [id number] Page 12 of  15 03-11-2023 upon evaluation today patient appears to be doing well currently in regard to his wounds. He has been tolerating the dressing changes without complication. Fortunately I do not see any signs of active infection at this time which is great news. 11/6; patient missed his clinic appointment last week. He took his wraps off at some point. Did not take his diuretics because he was nauseated last week. He comes in with massive bilateral edema which is lymphedema and probably some degree of systemic fluid overload. 11/13; patient with very poorly controlled lymphedema. We have been putting  4-layer equivalent wraps on him he says he is using his compression pumps twice a day. And elevating his legs nevertheless the edema control today is very poor. He has a substantial wound on the right medial lower leg also of the left medial and left posterior. We have been using alginate as a primary dressing or Aquacel Ag 11/20; some improvement in the amount of edema. We have been using 4-layer equivalent wraps and he is pumping with his external compression pumps twice a day. His legs had improved in terms of edema although there is still massively edematous. He tells me that he is on torsemide 1-1/2 tablets 20 mg tabs a day 12/4; massive edema extending up into his groin. Interestingly his edema in the thighs is more pitting then nonpitting lymphedema. His wounds are on both legs. This is not something that I think we can debride although I did do this last week I am not sure this did him any good. We have been using 4-layer compression he is using his pumps twice a day. I misunderstood his torsemide dosage which apparently is 1 tablet alternating with a half a tablet daily. I think he probably needs more than that. Unfortunately he cannot get a hold of his cardiologist... 12/11 massive edema in the bilateral lower legs extending up into his posterior thighs towards his groin. He tells me he took his  compression wrap off on Sunday. He says he is using his external compression pumps twice a day. He has wounds predominantly on the right medial leg left lateral left anterior and left medial leg. The patient states he has tried to contact his cardiologist vis--vis diuretic use and he just cannot get a hold of anybody in the office 12/18; massive edema in his bilateral lower legs extending up into his groin. He has an elevated jugular venous pressure. He tells me his torsemide was increased to 30 mg he takes 130 mg tablet. He has wounds on the right medial left medial left anterior and left lateral lower legs they are covered in a nonviable surface but with this amount of edema debridement just is not indicated. He says he is using his compression pumps 3 times a day he had to cut down the wraps because they were too loose. 05/27/2023; patient presents for follow-up. He has not been here in 3 weeks. He states he is using his lymphedema pumps. He is using silver alginate to the wound beds under his Velcro compression wraps. 06/02/2023; patient presents for follow-up. At last clinic visit we restarted the compression wraps. We have been using antibiotic ointment and antifungal cream with silver alginate under 4-layer compression. He states he has had Keystone antibiotic ointment in the past and this has done well for him. He states he has been using his lymphedema pumps. Wounds have improved. 06/09/2023; patient presents for follow-up. We have been using antibiotic ointment and antifungal cream with silver alginate under compression therapy. Wound culture was done at last clinic visit that grew 11 different bacterial strains with the most predominant being peptoniphilus harei, serratia marcescens, e.coli and finegoldia magna. Keystone antibiotic spray has been ordered however patient has not received it yet. He states his compression pumps are not working anymore. Objective Constitutional respirations  regular, non-labored and within target range for patient.. Vitals Time Taken: 3:42 PM, Height: 74 in, Weight: 250 lbs, BMI: 32.1, Temperature: 98.3 F, Pulse: 63 bpm, Respiratory Rate: 20 breaths/min, Blood Pressure: 163/78 mmHg. Cardiovascular 2+ dorsalis pedis/posterior tibialis pulses. Psychiatric  pleasant and cooperative. General Notes: Scattered open wounds to the lower extremities bilaterally with non viable tissue and granulation tissue. Stage IV lymphedema. No obvious signs of overt soft tissue infection. Integumentary (Hair, Skin) Wound #17 status is Open. Original cause of wound was Gradually Appeared. The date acquired was: 05/20/2023. The wound has been in treatment 1 weeks. The wound is located on the Right,Anterior Lower Leg. The wound measures 2cm length x 4cm width x 0.1cm depth; 6.283cm^2 area and 0.628cm^3 volume. There is Fat Layer (Subcutaneous Tissue) exposed. There is no tunneling or undermining noted. There is a large amount of serosanguineous drainage noted. Foul odor after cleansing was noted. The wound margin is distinct with the outline attached to the wound base. There is no granulation within the wound bed. There is a large (67-100%) amount of necrotic tissue within the wound bed including Adherent Slough. The periwound skin appearance did not exhibit: Callus, Crepitus, Excoriation, Induration, Rash, Scarring, Dry/Scaly, Maceration, Atrophie Blanche, Cyanosis, Ecchymosis, Hemosiderin Staining, Mottled, Pallor, Rubor, Erythema. Wound #6 status is Open. Original cause of wound was Gradually Appeared. The date acquired was: 03/05/2022. The wound has been in treatment 62 weeks. The wound is located on the Right,Medial Lower Leg. The wound measures 18.2cm length x 22cm width x 0.8cm depth; 314.473cm^2 area and 251.579cm^3 volume. There is Fat Layer (Subcutaneous Tissue) exposed. There is no tunneling or undermining noted. There is a large amount of serous drainage noted.  The wound margin is distinct with the outline attached to the wound base. There is no granulation within the wound bed. There is a large (67-100%) amount of necrotic tissue within the wound bed including Adherent Slough. The periwound skin appearance exhibited: Scarring, Hemosiderin Staining. The periwound skin appearance did not exhibit: Callus, Crepitus, Excoriation, Induration, Rash, Dry/Scaly, Maceration, Atrophie Blanche, Cyanosis, Ecchymosis, Mottled, Pallor, Rubor, Erythema. Periwound temperature was noted as No Abnormality. Wound #7 status is Open. Original cause of wound was Gradually Appeared. The date acquired was: 03/05/2022. The wound has been in treatment 62 weeks. The wound is located on the Left,Circumferential Lower Leg. The wound measures 18cm length x 53cm width x 0.5cm depth; 749.27cm^2 area and 374.635cm^3 volume. There is Fat Layer (Subcutaneous Tissue) exposed. There is no tunneling or undermining noted. There is a large amount of serous drainage noted. The wound margin is distinct with the outline attached to the wound base. There is no granulation within the wound bed. There is a large (67-100%) amount of necrotic tissue within the wound bed including Adherent Slough. The periwound skin appearance exhibited: Scarring, Hemosiderin Staining. The periwound skin [name], [name] ([id number]) [id number].pdf Page 13 of 15 appearance did not exhibit: Callus, Crepitus, Excoriation, Induration, Rash, Dry/Scaly, Maceration, Atrophie Blanche, Cyanosis, Ecchymosis, Mottled, Pallor, Rubor, Erythema. Periwound temperature was noted as No Abnormality. 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 He states he is not able to use his lymphedema pumps. I recommended he follow-up with the lymph pump company to see if they can get these replaced. Keystone antibiotic spray was ordered based on wound culture done at last clinic visit. Patient has not received this yet. I recommended he bring this into next clinic visit once  he obtains it. He has significant lymphedema with difficult to treat wounds. At this time I recommended continuing gentamicin ointment, mupirocin ointment and antifungal cream with silver alginate under compression therapy. Procedures Wound #17 Pre-procedure diagnosis of Wound #17 is a Lymphedema located on the Right,Anterior Lower Leg . There was a Double Layer Compression Therapy Procedure by [name], RN. Post procedure Diagnosis Wound #17: Same as Pre-Procedure Wound #6 Pre-procedure diagnosis of Wound #6 is a Lymphedema located on the Right,Medial Lower Leg . There was a Double Layer Compression Therapy Procedure by [name], RN. Post procedure Diagnosis Wound #6: Same as Pre-Procedure Wound #7 Pre-procedure diagnosis of Wound #7 is a Lymphedema located on the Left,Circumferential Lower Leg . There was a Double Layer Compression Therapy Procedure by [name], RN. Post procedure Diagnosis Wound #7: Same as Pre-Procedure Plan Follow-up Appointments: Return Appointment in 1 week. - +++ Extra Time++++Dr. Mikey Bussing Tuesday Other: - take your fluid pills as prescribed. Speak with cardiologist about increasing fluid pills T wraps off in 1 wk, dress wounds yourself, wear stockings, ake continue to pump 3x day *****Call [facility] pharmacy to purchase topical compounding antibiotics***** *****Call your lymphedema pump company to get your sleeves Anesthetic: (In clinic) Topical Lidocaine 4% applied to wound  bed Bathing/ Shower/ Hygiene: May shower with protection but do not get wound dressing(s) wet. Protect dressing(s) with water repellant cover (for example, large plastic bag) or a cast cover and may then take shower. Edema Control - Orders / Instructions: Segmental Compressive Device. Use the Segmental Compressive Device on leg(s) 2-3 times a day for 45 - 60 minutes. If wearing any wraps or hose, do not remove them. Continue exercising as instructed. - x3 a day Elevate legs to the level of the heart or above for 30 minutes daily and/or when sitting for 3-4 times a day throughout the day. Avoid standing for long periods of time. Exercise regularly Off-Loading: Other: - When sitting please keep your feet up, keep legs at Heart Level or above. Use pillows behind calf to for comfort. Additional Orders / Instructions: Follow Nutritious Diet - Try and increase protein intake. Goal of protein intake is 60g-100g. Lymphedema Treatment Plan - Exercise, Compression and Elevation: Exercise daily as tolerated. (Walking, ROM, Calf Pumps and T T oe aps) Elevate legs 30 - 60 minutes at or above heart level at least 3 - 4 times daily as able/tolerated Avoid standing for long periods and elevate leg(s) parallel to the floor when sitting Use Pneumatic Compression Device on leg(s) 2-3 times a day for 45-60 minutes - x3 a day WOUND #17: - Lower Leg Wound Laterality: Right, Anterior Cleanser: Soap and Water Every Other Day/30 Days Discharge Instructions: May shower and wash wound with dial antibacterial soap and water prior to dressing change. Peri-Wound Care: Sween Lotion (Moisturizing lotion) Every Other Day/30 Days Discharge Instructions: Apply moisturizing lotion as directed Topical: Gentamicin Every Other Day/30 Days Discharge Instructions: As directed by physician Topical: Mupirocin Ointment Every Other Day/30 Days Discharge Instructions: Apply Mupirocin (Bactroban) as instructed [name], [name]  ([id number]) [id number] Page 14 of 15 Topical: Ketoconazole Cream 2% Every Other Day/30 Days Discharge Instructions: Apply Ketoconazole as directed Topical: Zinc Oxide Ointment 30g tube Every Other Day/30 Days Discharge Instructions: mix in with the other topical medications Prim Dressing: Maxorb Extra CMC/Alginate Dressing, 4x4 (in/in) (Generic) Every Other Day/30 Days ary Discharge Instructions: Apply to wound bed as instructed Secondary Dressing: ABD Pad, 8x10 Every Other Day/30 Days Discharge Instructions: Apply over primary dressing as  directed. Secondary Dressing: Zetuvit Plus 4x8 in Every Other Day/30 Days Discharge Instructions: Apply over primary dressing as directed. Secondary Dressing: OptiLock Super Absorbent, 5x5.5 (in/in) (Generic) Every Other Day/30 Days Discharge Instructions: Apply directly to wound bed as directed Com pression Wrap: Urgo K2, (equivalent to a 4 layer) two layer compression system, regular Every Other Day/30 Days Discharge Instructions: Apply Urgo K2 as directed (alternative to 4 layer compression). WOUND #6: - Lower Leg Wound Laterality: Right, Medial Cleanser: Soap and Water Every Other Day/30 Days Discharge Instructions: May shower and wash wound with dial antibacterial soap and water prior to dressing change. Peri-Wound Care: Sween Lotion (Moisturizing lotion) Every Other Day/30 Days Discharge Instructions: Apply moisturizing lotion as directed Topical: Gentamicin Every Other Day/30 Days Discharge Instructions: As directed by physician Topical: Mupirocin Ointment Every Other Day/30 Days Discharge Instructions: Apply Mupirocin (Bactroban) as instructed Topical: Ketoconazole Cream 2% Every Other Day/30 Days Discharge Instructions: Apply Ketoconazole as directed Topical: Zinc Oxide Ointment 30g tube Every Other Day/30 Days Discharge Instructions: mix in with the other topical medications Prim Dressing: Maxorb Extra  CMC/Alginate Dressing, 4x4 (in/in) (Generic) Every Other Day/30 Days ary Discharge Instructions: Apply to wound bed as instructed Secondary Dressing: ABD Pad, 8x10 Every Other Day/30 Days Discharge Instructions: Apply over primary dressing as directed. Secondary Dressing: Zetuvit Plus 4x8 in Every Other Day/30 Days Discharge Instructions: Apply over primary dressing as directed. Secondary Dressing: OptiLock Super Absorbent, 5x5.5 (in/in) (Generic) Every Other Day/30 Days Discharge Instructions: Apply directly to wound bed as directed Com pression Wrap: Urgo K2, (equivalent to a 4 layer) two layer compression system, regular Every Other Day/30 Days Discharge Instructions: Apply Urgo K2 as directed (alternative to 4 layer compression). WOUND #7: - Lower Leg Wound Laterality: Left, Circumferential Cleanser: Soap and Water Every Other Day/30 Days Discharge Instructions: May shower and wash wound with dial antibacterial soap and water prior to dressing change. Peri-Wound Care: Sween Lotion (Moisturizing lotion) Every Other Day/30 Days Discharge Instructions: Apply moisturizing lotion as directed Topical: Gentamicin Every Other Day/30 Days Discharge Instructions: As directed by physician Topical: Mupirocin Ointment Every Other Day/30 Days Discharge Instructions: Apply Mupirocin (Bactroban) as instructed Topical: Ketoconazole Cream 2% Every Other Day/30 Days Discharge Instructions: Apply Ketoconazole as directed Topical: Zinc Oxide Ointment 30g tube Every Other Day/30 Days Discharge Instructions: mix in with the other topical medications Prim Dressing: Maxorb Extra CMC/Alginate Dressing, 4x4 (in/in) (Generic) Every Other Day/30 Days ary Discharge Instructions: Apply to wound bed as instructed Secondary Dressing: ABD Pad, 8x10 Every Other Day/30 Days Discharge Instructions: Apply over primary dressing as directed. Secondary Dressing: Zetuvit Plus 4x8 in Every Other Day/30 Days Discharge  Instructions: Apply over primary dressing as directed. Secondary Dressing: OptiLock Super Absorbent, 5x5.5 (in/in) (Generic) Every Other Day/30 Days Discharge Instructions: Apply directly to wound bed as directed Com pression Wrap: Urgo K2, (equivalent to a 4 layer) two layer compression system, regular Every Other Day/30 Days Discharge Instructions: Apply Urgo K2 as directed (alternative to 4 layer compression). 1. Antibiotic ointment, antifungal ointment and silver alginate under compression therapy to the lower extremities bilaterally 2. Patient needs to follow-up to have these replaced 3. Follow-up in 1 week in the wound care clinic 4. Patient to bring in Edna antibiotic to next visit Electronic Signature(s) Signed: 06/09/2023 3:49:44 PM By: Geralyn Corwin DO Entered By: Geralyn Corwin on 06/09/2023 15:49:14 -------------------------------------------------------------------------------- SuperBill Details Patient Name: Date of Service: [name], [name] 06/09/2023 Medical Record Number: [id number] Patient Account Number: [id number] Date of Birth/Sex: Treating RN: 1946/04/10 (77  y.o. [name] Primary Care Provider: [name] Other Clinician: [place], [name] ([id number]) [id number] Page 15 of 15 Referring Provider: Treating Provider/Extender: Zenda Alpers Weeks in Treatment: 74 Diagnosis Coding ICD-10 Codes Code Description I87.333 Chronic venous hypertension (idiopathic) with ulcer and inflammation of bilateral lower extremity I89.0 Lymphedema, not elsewhere classified 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.818 Non-pressure chronic ulcer of other part of right lower leg with other specified severity I50.42 Chronic combined systolic (congestive) and diastolic (congestive) heart failure Facility Procedures : CPT4: Code [id number] Description: 81  BILATERAL: Application of multi-layer venous compression system; leg (below knee), including ankle and t. 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

## 2023-06-16 ENCOUNTER — Ambulatory Visit (HOSPITAL_BASED_OUTPATIENT_CLINIC_OR_DEPARTMENT_OTHER): Payer: Medicare Other | Admitting: Internal Medicine

## 2023-06-24 ENCOUNTER — Encounter (HOSPITAL_BASED_OUTPATIENT_CLINIC_OR_DEPARTMENT_OTHER): Payer: Medicare Other | Attending: Internal Medicine | Admitting: Internal Medicine

## 2023-06-24 DIAGNOSIS — I890 Lymphedema, not elsewhere classified: Secondary | ICD-10-CM | POA: Diagnosis not present

## 2023-06-24 DIAGNOSIS — L97818 Non-pressure chronic ulcer of other part of right lower leg with other specified severity: Secondary | ICD-10-CM | POA: Insufficient documentation

## 2023-06-24 DIAGNOSIS — I5042 Chronic combined systolic (congestive) and diastolic (congestive) heart failure: Secondary | ICD-10-CM | POA: Insufficient documentation

## 2023-06-24 DIAGNOSIS — I87333 Chronic venous hypertension (idiopathic) with ulcer and inflammation of bilateral lower extremity: Secondary | ICD-10-CM | POA: Diagnosis not present

## 2023-06-24 DIAGNOSIS — L97828 Non-pressure chronic ulcer of other part of left lower leg with other specified severity: Secondary | ICD-10-CM | POA: Diagnosis not present

## 2023-07-01 ENCOUNTER — Encounter (HOSPITAL_BASED_OUTPATIENT_CLINIC_OR_DEPARTMENT_OTHER): Payer: Medicare Other | Admitting: Internal Medicine

## 2023-07-01 DIAGNOSIS — I87333 Chronic venous hypertension (idiopathic) with ulcer and inflammation of bilateral lower extremity: Secondary | ICD-10-CM

## 2023-07-01 DIAGNOSIS — L97828 Non-pressure chronic ulcer of other part of left lower leg with other specified severity: Secondary | ICD-10-CM | POA: Diagnosis not present

## 2023-07-01 DIAGNOSIS — L97818 Non-pressure chronic ulcer of other part of right lower leg with other specified severity: Secondary | ICD-10-CM | POA: Diagnosis not present

## 2023-07-01 DIAGNOSIS — I890 Lymphedema, not elsewhere classified: Secondary | ICD-10-CM

## 2023-07-08 ENCOUNTER — Encounter (HOSPITAL_BASED_OUTPATIENT_CLINIC_OR_DEPARTMENT_OTHER): Payer: Medicare Other | Admitting: Internal Medicine

## 2023-07-15 ENCOUNTER — Encounter (HOSPITAL_BASED_OUTPATIENT_CLINIC_OR_DEPARTMENT_OTHER): Payer: Medicare Other | Admitting: Internal Medicine

## 2023-07-15 DIAGNOSIS — I87333 Chronic venous hypertension (idiopathic) with ulcer and inflammation of bilateral lower extremity: Secondary | ICD-10-CM | POA: Diagnosis not present

## 2023-07-15 DIAGNOSIS — L97828 Non-pressure chronic ulcer of other part of left lower leg with other specified severity: Secondary | ICD-10-CM | POA: Diagnosis not present

## 2023-07-15 DIAGNOSIS — L97818 Non-pressure chronic ulcer of other part of right lower leg with other specified severity: Secondary | ICD-10-CM

## 2023-07-22 ENCOUNTER — Encounter (HOSPITAL_BASED_OUTPATIENT_CLINIC_OR_DEPARTMENT_OTHER): Payer: Medicare Other | Attending: Internal Medicine | Admitting: Internal Medicine

## 2023-07-22 DIAGNOSIS — L97828 Non-pressure chronic ulcer of other part of left lower leg with other specified severity: Secondary | ICD-10-CM | POA: Diagnosis not present

## 2023-07-22 DIAGNOSIS — I5042 Chronic combined systolic (congestive) and diastolic (congestive) heart failure: Secondary | ICD-10-CM | POA: Insufficient documentation

## 2023-07-22 DIAGNOSIS — I890 Lymphedema, not elsewhere classified: Secondary | ICD-10-CM | POA: Diagnosis present

## 2023-07-22 DIAGNOSIS — I87333 Chronic venous hypertension (idiopathic) with ulcer and inflammation of bilateral lower extremity: Secondary | ICD-10-CM | POA: Diagnosis not present

## 2023-07-22 DIAGNOSIS — L97818 Non-pressure chronic ulcer of other part of right lower leg with other specified severity: Secondary | ICD-10-CM | POA: Diagnosis not present

## 2023-07-29 ENCOUNTER — Encounter (HOSPITAL_BASED_OUTPATIENT_CLINIC_OR_DEPARTMENT_OTHER): Payer: Medicare Other | Admitting: Internal Medicine

## 2023-07-29 DIAGNOSIS — I87333 Chronic venous hypertension (idiopathic) with ulcer and inflammation of bilateral lower extremity: Secondary | ICD-10-CM | POA: Diagnosis not present

## 2023-07-29 DIAGNOSIS — L97828 Non-pressure chronic ulcer of other part of left lower leg with other specified severity: Secondary | ICD-10-CM | POA: Diagnosis not present

## 2023-07-29 DIAGNOSIS — L97818 Non-pressure chronic ulcer of other part of right lower leg with other specified severity: Secondary | ICD-10-CM | POA: Diagnosis not present

## 2023-07-29 DIAGNOSIS — I890 Lymphedema, not elsewhere classified: Secondary | ICD-10-CM | POA: Diagnosis not present

## 2023-08-05 ENCOUNTER — Encounter (HOSPITAL_BASED_OUTPATIENT_CLINIC_OR_DEPARTMENT_OTHER): Payer: Medicare Other | Admitting: Internal Medicine

## 2023-08-05 DIAGNOSIS — L97818 Non-pressure chronic ulcer of other part of right lower leg with other specified severity: Secondary | ICD-10-CM | POA: Diagnosis not present

## 2023-08-05 DIAGNOSIS — I87333 Chronic venous hypertension (idiopathic) with ulcer and inflammation of bilateral lower extremity: Secondary | ICD-10-CM

## 2023-08-05 DIAGNOSIS — L97828 Non-pressure chronic ulcer of other part of left lower leg with other specified severity: Secondary | ICD-10-CM | POA: Diagnosis not present

## 2023-08-05 DIAGNOSIS — I890 Lymphedema, not elsewhere classified: Secondary | ICD-10-CM | POA: Diagnosis not present

## 2023-08-12 ENCOUNTER — Encounter (HOSPITAL_BASED_OUTPATIENT_CLINIC_OR_DEPARTMENT_OTHER): Admitting: Internal Medicine

## 2023-08-12 DIAGNOSIS — I890 Lymphedema, not elsewhere classified: Secondary | ICD-10-CM | POA: Diagnosis not present

## 2023-08-12 DIAGNOSIS — I87333 Chronic venous hypertension (idiopathic) with ulcer and inflammation of bilateral lower extremity: Secondary | ICD-10-CM

## 2023-08-12 DIAGNOSIS — L97828 Non-pressure chronic ulcer of other part of left lower leg with other specified severity: Secondary | ICD-10-CM

## 2023-08-12 DIAGNOSIS — L97818 Non-pressure chronic ulcer of other part of right lower leg with other specified severity: Secondary | ICD-10-CM

## 2023-08-19 ENCOUNTER — Encounter (HOSPITAL_BASED_OUTPATIENT_CLINIC_OR_DEPARTMENT_OTHER): Attending: Internal Medicine | Admitting: Internal Medicine

## 2023-08-19 DIAGNOSIS — I890 Lymphedema, not elsewhere classified: Secondary | ICD-10-CM | POA: Diagnosis not present

## 2023-08-19 DIAGNOSIS — L97828 Non-pressure chronic ulcer of other part of left lower leg with other specified severity: Secondary | ICD-10-CM | POA: Insufficient documentation

## 2023-08-19 DIAGNOSIS — I87333 Chronic venous hypertension (idiopathic) with ulcer and inflammation of bilateral lower extremity: Secondary | ICD-10-CM | POA: Insufficient documentation

## 2023-08-19 DIAGNOSIS — I5042 Chronic combined systolic (congestive) and diastolic (congestive) heart failure: Secondary | ICD-10-CM | POA: Diagnosis not present

## 2023-08-19 DIAGNOSIS — L97818 Non-pressure chronic ulcer of other part of right lower leg with other specified severity: Secondary | ICD-10-CM | POA: Diagnosis not present

## 2023-08-26 ENCOUNTER — Encounter (HOSPITAL_BASED_OUTPATIENT_CLINIC_OR_DEPARTMENT_OTHER): Admitting: Internal Medicine

## 2023-08-26 DIAGNOSIS — L97818 Non-pressure chronic ulcer of other part of right lower leg with other specified severity: Secondary | ICD-10-CM

## 2023-08-26 DIAGNOSIS — I87333 Chronic venous hypertension (idiopathic) with ulcer and inflammation of bilateral lower extremity: Secondary | ICD-10-CM | POA: Diagnosis not present

## 2023-08-26 DIAGNOSIS — L97828 Non-pressure chronic ulcer of other part of left lower leg with other specified severity: Secondary | ICD-10-CM | POA: Diagnosis not present

## 2023-09-02 ENCOUNTER — Encounter (HOSPITAL_BASED_OUTPATIENT_CLINIC_OR_DEPARTMENT_OTHER): Admitting: Internal Medicine

## 2023-09-02 DIAGNOSIS — I87333 Chronic venous hypertension (idiopathic) with ulcer and inflammation of bilateral lower extremity: Secondary | ICD-10-CM | POA: Diagnosis not present

## 2023-09-09 ENCOUNTER — Encounter (HOSPITAL_BASED_OUTPATIENT_CLINIC_OR_DEPARTMENT_OTHER): Admitting: Internal Medicine

## 2023-09-09 DIAGNOSIS — I890 Lymphedema, not elsewhere classified: Secondary | ICD-10-CM

## 2023-09-09 DIAGNOSIS — I87333 Chronic venous hypertension (idiopathic) with ulcer and inflammation of bilateral lower extremity: Secondary | ICD-10-CM | POA: Diagnosis not present

## 2023-09-09 DIAGNOSIS — L97818 Non-pressure chronic ulcer of other part of right lower leg with other specified severity: Secondary | ICD-10-CM | POA: Diagnosis not present

## 2023-09-09 DIAGNOSIS — L97828 Non-pressure chronic ulcer of other part of left lower leg with other specified severity: Secondary | ICD-10-CM | POA: Diagnosis not present

## 2023-09-16 ENCOUNTER — Encounter (HOSPITAL_BASED_OUTPATIENT_CLINIC_OR_DEPARTMENT_OTHER): Admitting: Internal Medicine

## 2023-09-16 DIAGNOSIS — I87333 Chronic venous hypertension (idiopathic) with ulcer and inflammation of bilateral lower extremity: Secondary | ICD-10-CM

## 2023-09-16 DIAGNOSIS — I890 Lymphedema, not elsewhere classified: Secondary | ICD-10-CM

## 2023-09-16 DIAGNOSIS — L97818 Non-pressure chronic ulcer of other part of right lower leg with other specified severity: Secondary | ICD-10-CM | POA: Diagnosis not present

## 2023-09-16 DIAGNOSIS — L97828 Non-pressure chronic ulcer of other part of left lower leg with other specified severity: Secondary | ICD-10-CM

## 2023-09-23 ENCOUNTER — Encounter (HOSPITAL_BASED_OUTPATIENT_CLINIC_OR_DEPARTMENT_OTHER): Attending: Internal Medicine | Admitting: Internal Medicine

## 2023-09-23 DIAGNOSIS — I5042 Chronic combined systolic (congestive) and diastolic (congestive) heart failure: Secondary | ICD-10-CM | POA: Insufficient documentation

## 2023-09-23 DIAGNOSIS — L97818 Non-pressure chronic ulcer of other part of right lower leg with other specified severity: Secondary | ICD-10-CM | POA: Insufficient documentation

## 2023-09-23 DIAGNOSIS — I87333 Chronic venous hypertension (idiopathic) with ulcer and inflammation of bilateral lower extremity: Secondary | ICD-10-CM | POA: Diagnosis present

## 2023-09-23 DIAGNOSIS — L97828 Non-pressure chronic ulcer of other part of left lower leg with other specified severity: Secondary | ICD-10-CM | POA: Insufficient documentation

## 2023-09-23 DIAGNOSIS — I890 Lymphedema, not elsewhere classified: Secondary | ICD-10-CM | POA: Insufficient documentation

## 2023-09-30 ENCOUNTER — Encounter (HOSPITAL_BASED_OUTPATIENT_CLINIC_OR_DEPARTMENT_OTHER): Admitting: Internal Medicine

## 2023-09-30 DIAGNOSIS — I87333 Chronic venous hypertension (idiopathic) with ulcer and inflammation of bilateral lower extremity: Secondary | ICD-10-CM | POA: Diagnosis not present

## 2023-10-07 ENCOUNTER — Encounter (HOSPITAL_BASED_OUTPATIENT_CLINIC_OR_DEPARTMENT_OTHER): Admitting: Internal Medicine

## 2023-10-07 DIAGNOSIS — I890 Lymphedema, not elsewhere classified: Secondary | ICD-10-CM

## 2023-10-07 DIAGNOSIS — I87333 Chronic venous hypertension (idiopathic) with ulcer and inflammation of bilateral lower extremity: Secondary | ICD-10-CM | POA: Diagnosis not present

## 2023-10-07 DIAGNOSIS — L97818 Non-pressure chronic ulcer of other part of right lower leg with other specified severity: Secondary | ICD-10-CM

## 2023-10-07 DIAGNOSIS — L97828 Non-pressure chronic ulcer of other part of left lower leg with other specified severity: Secondary | ICD-10-CM | POA: Diagnosis not present

## 2023-10-14 ENCOUNTER — Encounter (HOSPITAL_BASED_OUTPATIENT_CLINIC_OR_DEPARTMENT_OTHER): Admitting: Internal Medicine

## 2023-10-14 DIAGNOSIS — I890 Lymphedema, not elsewhere classified: Secondary | ICD-10-CM | POA: Diagnosis not present

## 2023-10-14 DIAGNOSIS — I87333 Chronic venous hypertension (idiopathic) with ulcer and inflammation of bilateral lower extremity: Secondary | ICD-10-CM

## 2023-10-14 DIAGNOSIS — L97828 Non-pressure chronic ulcer of other part of left lower leg with other specified severity: Secondary | ICD-10-CM

## 2023-10-14 DIAGNOSIS — L97818 Non-pressure chronic ulcer of other part of right lower leg with other specified severity: Secondary | ICD-10-CM | POA: Diagnosis not present

## 2023-10-21 ENCOUNTER — Encounter (HOSPITAL_BASED_OUTPATIENT_CLINIC_OR_DEPARTMENT_OTHER): Attending: Internal Medicine | Admitting: Internal Medicine

## 2023-10-21 DIAGNOSIS — L97818 Non-pressure chronic ulcer of other part of right lower leg with other specified severity: Secondary | ICD-10-CM | POA: Insufficient documentation

## 2023-10-21 DIAGNOSIS — I5042 Chronic combined systolic (congestive) and diastolic (congestive) heart failure: Secondary | ICD-10-CM | POA: Insufficient documentation

## 2023-10-21 DIAGNOSIS — L97828 Non-pressure chronic ulcer of other part of left lower leg with other specified severity: Secondary | ICD-10-CM | POA: Insufficient documentation

## 2023-10-28 ENCOUNTER — Encounter (HOSPITAL_BASED_OUTPATIENT_CLINIC_OR_DEPARTMENT_OTHER): Admitting: Internal Medicine

## 2023-10-28 DIAGNOSIS — I87333 Chronic venous hypertension (idiopathic) with ulcer and inflammation of bilateral lower extremity: Secondary | ICD-10-CM

## 2023-10-28 DIAGNOSIS — L97818 Non-pressure chronic ulcer of other part of right lower leg with other specified severity: Secondary | ICD-10-CM | POA: Diagnosis not present

## 2023-10-28 DIAGNOSIS — L97828 Non-pressure chronic ulcer of other part of left lower leg with other specified severity: Secondary | ICD-10-CM | POA: Diagnosis not present

## 2023-10-28 DIAGNOSIS — I5042 Chronic combined systolic (congestive) and diastolic (congestive) heart failure: Secondary | ICD-10-CM | POA: Diagnosis not present

## 2023-11-04 ENCOUNTER — Encounter (HOSPITAL_BASED_OUTPATIENT_CLINIC_OR_DEPARTMENT_OTHER): Admitting: Internal Medicine

## 2023-11-04 DIAGNOSIS — I87333 Chronic venous hypertension (idiopathic) with ulcer and inflammation of bilateral lower extremity: Secondary | ICD-10-CM

## 2023-11-04 DIAGNOSIS — L97828 Non-pressure chronic ulcer of other part of left lower leg with other specified severity: Secondary | ICD-10-CM

## 2023-11-04 DIAGNOSIS — L97818 Non-pressure chronic ulcer of other part of right lower leg with other specified severity: Secondary | ICD-10-CM | POA: Diagnosis not present

## 2023-11-04 DIAGNOSIS — I890 Lymphedema, not elsewhere classified: Secondary | ICD-10-CM

## 2023-11-11 ENCOUNTER — Encounter (HOSPITAL_BASED_OUTPATIENT_CLINIC_OR_DEPARTMENT_OTHER): Admitting: Internal Medicine

## 2023-11-11 DIAGNOSIS — I890 Lymphedema, not elsewhere classified: Secondary | ICD-10-CM

## 2023-11-11 DIAGNOSIS — L97818 Non-pressure chronic ulcer of other part of right lower leg with other specified severity: Secondary | ICD-10-CM

## 2023-11-11 DIAGNOSIS — L97828 Non-pressure chronic ulcer of other part of left lower leg with other specified severity: Secondary | ICD-10-CM | POA: Diagnosis not present

## 2023-11-11 DIAGNOSIS — I87333 Chronic venous hypertension (idiopathic) with ulcer and inflammation of bilateral lower extremity: Secondary | ICD-10-CM

## 2023-11-18 ENCOUNTER — Encounter (HOSPITAL_BASED_OUTPATIENT_CLINIC_OR_DEPARTMENT_OTHER): Attending: General Surgery | Admitting: General Surgery

## 2023-11-18 DIAGNOSIS — L97818 Non-pressure chronic ulcer of other part of right lower leg with other specified severity: Secondary | ICD-10-CM | POA: Diagnosis not present

## 2023-11-18 DIAGNOSIS — L97828 Non-pressure chronic ulcer of other part of left lower leg with other specified severity: Secondary | ICD-10-CM | POA: Insufficient documentation

## 2023-11-18 DIAGNOSIS — I5042 Chronic combined systolic (congestive) and diastolic (congestive) heart failure: Secondary | ICD-10-CM | POA: Insufficient documentation

## 2023-11-18 DIAGNOSIS — I87333 Chronic venous hypertension (idiopathic) with ulcer and inflammation of bilateral lower extremity: Secondary | ICD-10-CM | POA: Diagnosis present

## 2023-11-18 DIAGNOSIS — I890 Lymphedema, not elsewhere classified: Secondary | ICD-10-CM | POA: Diagnosis not present

## 2023-11-25 ENCOUNTER — Encounter (HOSPITAL_BASED_OUTPATIENT_CLINIC_OR_DEPARTMENT_OTHER): Admitting: Internal Medicine

## 2023-11-25 DIAGNOSIS — I890 Lymphedema, not elsewhere classified: Secondary | ICD-10-CM | POA: Diagnosis not present

## 2023-11-25 DIAGNOSIS — L97828 Non-pressure chronic ulcer of other part of left lower leg with other specified severity: Secondary | ICD-10-CM

## 2023-11-25 DIAGNOSIS — L97818 Non-pressure chronic ulcer of other part of right lower leg with other specified severity: Secondary | ICD-10-CM | POA: Diagnosis not present

## 2023-11-25 DIAGNOSIS — I87333 Chronic venous hypertension (idiopathic) with ulcer and inflammation of bilateral lower extremity: Secondary | ICD-10-CM

## 2023-12-02 ENCOUNTER — Encounter (HOSPITAL_BASED_OUTPATIENT_CLINIC_OR_DEPARTMENT_OTHER): Admitting: Internal Medicine

## 2023-12-02 DIAGNOSIS — L97828 Non-pressure chronic ulcer of other part of left lower leg with other specified severity: Secondary | ICD-10-CM

## 2023-12-02 DIAGNOSIS — L97818 Non-pressure chronic ulcer of other part of right lower leg with other specified severity: Secondary | ICD-10-CM | POA: Diagnosis not present

## 2023-12-02 DIAGNOSIS — I87333 Chronic venous hypertension (idiopathic) with ulcer and inflammation of bilateral lower extremity: Secondary | ICD-10-CM | POA: Diagnosis not present

## 2023-12-02 DIAGNOSIS — I890 Lymphedema, not elsewhere classified: Secondary | ICD-10-CM

## 2023-12-09 ENCOUNTER — Encounter (HOSPITAL_BASED_OUTPATIENT_CLINIC_OR_DEPARTMENT_OTHER): Admitting: Internal Medicine

## 2023-12-09 DIAGNOSIS — L97828 Non-pressure chronic ulcer of other part of left lower leg with other specified severity: Secondary | ICD-10-CM | POA: Diagnosis not present

## 2023-12-09 DIAGNOSIS — I890 Lymphedema, not elsewhere classified: Secondary | ICD-10-CM | POA: Diagnosis not present

## 2023-12-09 DIAGNOSIS — I87333 Chronic venous hypertension (idiopathic) with ulcer and inflammation of bilateral lower extremity: Secondary | ICD-10-CM | POA: Diagnosis not present

## 2023-12-09 DIAGNOSIS — L97818 Non-pressure chronic ulcer of other part of right lower leg with other specified severity: Secondary | ICD-10-CM

## 2023-12-16 ENCOUNTER — Encounter (HOSPITAL_BASED_OUTPATIENT_CLINIC_OR_DEPARTMENT_OTHER): Admitting: Internal Medicine

## 2023-12-16 DIAGNOSIS — I87333 Chronic venous hypertension (idiopathic) with ulcer and inflammation of bilateral lower extremity: Secondary | ICD-10-CM | POA: Diagnosis not present

## 2023-12-23 ENCOUNTER — Encounter (HOSPITAL_BASED_OUTPATIENT_CLINIC_OR_DEPARTMENT_OTHER): Admitting: Internal Medicine

## 2023-12-23 DIAGNOSIS — L97818 Non-pressure chronic ulcer of other part of right lower leg with other specified severity: Secondary | ICD-10-CM

## 2023-12-23 DIAGNOSIS — I87333 Chronic venous hypertension (idiopathic) with ulcer and inflammation of bilateral lower extremity: Secondary | ICD-10-CM | POA: Diagnosis present

## 2023-12-23 DIAGNOSIS — I890 Lymphedema, not elsewhere classified: Secondary | ICD-10-CM

## 2023-12-23 DIAGNOSIS — L97828 Non-pressure chronic ulcer of other part of left lower leg with other specified severity: Secondary | ICD-10-CM | POA: Diagnosis not present

## 2023-12-23 DIAGNOSIS — I5042 Chronic combined systolic (congestive) and diastolic (congestive) heart failure: Secondary | ICD-10-CM | POA: Diagnosis not present

## 2023-12-30 ENCOUNTER — Ambulatory Visit (HOSPITAL_BASED_OUTPATIENT_CLINIC_OR_DEPARTMENT_OTHER): Admitting: Internal Medicine

## 2023-12-31 ENCOUNTER — Encounter (HOSPITAL_BASED_OUTPATIENT_CLINIC_OR_DEPARTMENT_OTHER): Attending: Internal Medicine | Admitting: Internal Medicine

## 2023-12-31 DIAGNOSIS — L97828 Non-pressure chronic ulcer of other part of left lower leg with other specified severity: Secondary | ICD-10-CM

## 2023-12-31 DIAGNOSIS — I87333 Chronic venous hypertension (idiopathic) with ulcer and inflammation of bilateral lower extremity: Secondary | ICD-10-CM

## 2023-12-31 DIAGNOSIS — L97818 Non-pressure chronic ulcer of other part of right lower leg with other specified severity: Secondary | ICD-10-CM

## 2023-12-31 DIAGNOSIS — I890 Lymphedema, not elsewhere classified: Secondary | ICD-10-CM

## 2024-01-06 ENCOUNTER — Encounter (HOSPITAL_BASED_OUTPATIENT_CLINIC_OR_DEPARTMENT_OTHER): Admitting: Internal Medicine

## 2024-01-06 DIAGNOSIS — L97818 Non-pressure chronic ulcer of other part of right lower leg with other specified severity: Secondary | ICD-10-CM | POA: Diagnosis not present

## 2024-01-06 DIAGNOSIS — I890 Lymphedema, not elsewhere classified: Secondary | ICD-10-CM | POA: Diagnosis not present

## 2024-01-06 DIAGNOSIS — I87333 Chronic venous hypertension (idiopathic) with ulcer and inflammation of bilateral lower extremity: Secondary | ICD-10-CM

## 2024-01-06 DIAGNOSIS — L97828 Non-pressure chronic ulcer of other part of left lower leg with other specified severity: Secondary | ICD-10-CM | POA: Diagnosis not present

## 2024-01-13 ENCOUNTER — Encounter (HOSPITAL_BASED_OUTPATIENT_CLINIC_OR_DEPARTMENT_OTHER): Admitting: General Surgery

## 2024-01-13 DIAGNOSIS — I87333 Chronic venous hypertension (idiopathic) with ulcer and inflammation of bilateral lower extremity: Secondary | ICD-10-CM | POA: Diagnosis not present

## 2024-01-20 ENCOUNTER — Encounter (HOSPITAL_BASED_OUTPATIENT_CLINIC_OR_DEPARTMENT_OTHER): Attending: Internal Medicine | Admitting: Internal Medicine

## 2024-01-20 DIAGNOSIS — I890 Lymphedema, not elsewhere classified: Secondary | ICD-10-CM | POA: Insufficient documentation

## 2024-01-20 DIAGNOSIS — I5042 Chronic combined systolic (congestive) and diastolic (congestive) heart failure: Secondary | ICD-10-CM | POA: Insufficient documentation

## 2024-01-20 DIAGNOSIS — I87333 Chronic venous hypertension (idiopathic) with ulcer and inflammation of bilateral lower extremity: Secondary | ICD-10-CM | POA: Insufficient documentation

## 2024-01-20 DIAGNOSIS — L97828 Non-pressure chronic ulcer of other part of left lower leg with other specified severity: Secondary | ICD-10-CM | POA: Insufficient documentation

## 2024-01-20 DIAGNOSIS — L97818 Non-pressure chronic ulcer of other part of right lower leg with other specified severity: Secondary | ICD-10-CM | POA: Insufficient documentation

## 2024-01-27 ENCOUNTER — Encounter (HOSPITAL_BASED_OUTPATIENT_CLINIC_OR_DEPARTMENT_OTHER): Admitting: Internal Medicine

## 2024-01-27 DIAGNOSIS — I890 Lymphedema, not elsewhere classified: Secondary | ICD-10-CM | POA: Diagnosis not present

## 2024-01-27 DIAGNOSIS — I5042 Chronic combined systolic (congestive) and diastolic (congestive) heart failure: Secondary | ICD-10-CM | POA: Diagnosis not present

## 2024-01-27 DIAGNOSIS — I87333 Chronic venous hypertension (idiopathic) with ulcer and inflammation of bilateral lower extremity: Secondary | ICD-10-CM | POA: Diagnosis present

## 2024-01-27 DIAGNOSIS — L97828 Non-pressure chronic ulcer of other part of left lower leg with other specified severity: Secondary | ICD-10-CM | POA: Diagnosis not present

## 2024-01-27 DIAGNOSIS — L97818 Non-pressure chronic ulcer of other part of right lower leg with other specified severity: Secondary | ICD-10-CM | POA: Diagnosis not present

## 2024-02-03 ENCOUNTER — Encounter (HOSPITAL_BASED_OUTPATIENT_CLINIC_OR_DEPARTMENT_OTHER): Admitting: Internal Medicine

## 2024-02-03 DIAGNOSIS — I890 Lymphedema, not elsewhere classified: Secondary | ICD-10-CM | POA: Diagnosis not present

## 2024-02-03 DIAGNOSIS — L97828 Non-pressure chronic ulcer of other part of left lower leg with other specified severity: Secondary | ICD-10-CM

## 2024-02-03 DIAGNOSIS — L97818 Non-pressure chronic ulcer of other part of right lower leg with other specified severity: Secondary | ICD-10-CM

## 2024-02-03 DIAGNOSIS — I87333 Chronic venous hypertension (idiopathic) with ulcer and inflammation of bilateral lower extremity: Secondary | ICD-10-CM

## 2024-02-10 ENCOUNTER — Encounter (HOSPITAL_BASED_OUTPATIENT_CLINIC_OR_DEPARTMENT_OTHER): Admitting: Internal Medicine

## 2024-02-10 DIAGNOSIS — I890 Lymphedema, not elsewhere classified: Secondary | ICD-10-CM

## 2024-02-10 DIAGNOSIS — L97818 Non-pressure chronic ulcer of other part of right lower leg with other specified severity: Secondary | ICD-10-CM

## 2024-02-10 DIAGNOSIS — L97828 Non-pressure chronic ulcer of other part of left lower leg with other specified severity: Secondary | ICD-10-CM

## 2024-02-10 DIAGNOSIS — I87333 Chronic venous hypertension (idiopathic) with ulcer and inflammation of bilateral lower extremity: Secondary | ICD-10-CM | POA: Diagnosis not present

## 2024-02-18 ENCOUNTER — Encounter (HOSPITAL_BASED_OUTPATIENT_CLINIC_OR_DEPARTMENT_OTHER): Attending: Internal Medicine | Admitting: Internal Medicine

## 2024-02-18 DIAGNOSIS — I5042 Chronic combined systolic (congestive) and diastolic (congestive) heart failure: Secondary | ICD-10-CM | POA: Diagnosis not present

## 2024-02-18 DIAGNOSIS — I87333 Chronic venous hypertension (idiopathic) with ulcer and inflammation of bilateral lower extremity: Secondary | ICD-10-CM | POA: Insufficient documentation

## 2024-02-18 DIAGNOSIS — L97828 Non-pressure chronic ulcer of other part of left lower leg with other specified severity: Secondary | ICD-10-CM | POA: Diagnosis not present

## 2024-02-18 DIAGNOSIS — I890 Lymphedema, not elsewhere classified: Secondary | ICD-10-CM | POA: Insufficient documentation

## 2024-02-18 DIAGNOSIS — L97818 Non-pressure chronic ulcer of other part of right lower leg with other specified severity: Secondary | ICD-10-CM | POA: Insufficient documentation

## 2024-02-25 ENCOUNTER — Encounter (HOSPITAL_BASED_OUTPATIENT_CLINIC_OR_DEPARTMENT_OTHER): Admitting: Internal Medicine

## 2024-02-25 DIAGNOSIS — I890 Lymphedema, not elsewhere classified: Secondary | ICD-10-CM

## 2024-02-25 DIAGNOSIS — L97818 Non-pressure chronic ulcer of other part of right lower leg with other specified severity: Secondary | ICD-10-CM

## 2024-02-25 DIAGNOSIS — L97828 Non-pressure chronic ulcer of other part of left lower leg with other specified severity: Secondary | ICD-10-CM | POA: Diagnosis not present

## 2024-02-25 DIAGNOSIS — I87333 Chronic venous hypertension (idiopathic) with ulcer and inflammation of bilateral lower extremity: Secondary | ICD-10-CM | POA: Diagnosis not present

## 2024-03-02 ENCOUNTER — Encounter (HOSPITAL_BASED_OUTPATIENT_CLINIC_OR_DEPARTMENT_OTHER): Admitting: Internal Medicine

## 2024-03-02 DIAGNOSIS — L97828 Non-pressure chronic ulcer of other part of left lower leg with other specified severity: Secondary | ICD-10-CM | POA: Diagnosis not present

## 2024-03-02 DIAGNOSIS — I890 Lymphedema, not elsewhere classified: Secondary | ICD-10-CM

## 2024-03-02 DIAGNOSIS — L97818 Non-pressure chronic ulcer of other part of right lower leg with other specified severity: Secondary | ICD-10-CM | POA: Diagnosis not present

## 2024-03-02 DIAGNOSIS — I87333 Chronic venous hypertension (idiopathic) with ulcer and inflammation of bilateral lower extremity: Secondary | ICD-10-CM

## 2024-03-09 ENCOUNTER — Encounter (HOSPITAL_BASED_OUTPATIENT_CLINIC_OR_DEPARTMENT_OTHER): Admitting: Internal Medicine

## 2024-03-09 DIAGNOSIS — L97818 Non-pressure chronic ulcer of other part of right lower leg with other specified severity: Secondary | ICD-10-CM

## 2024-03-09 DIAGNOSIS — I87333 Chronic venous hypertension (idiopathic) with ulcer and inflammation of bilateral lower extremity: Secondary | ICD-10-CM

## 2024-03-09 DIAGNOSIS — I890 Lymphedema, not elsewhere classified: Secondary | ICD-10-CM | POA: Diagnosis not present

## 2024-03-09 DIAGNOSIS — L97828 Non-pressure chronic ulcer of other part of left lower leg with other specified severity: Secondary | ICD-10-CM

## 2024-03-16 ENCOUNTER — Ambulatory Visit (HOSPITAL_BASED_OUTPATIENT_CLINIC_OR_DEPARTMENT_OTHER): Admitting: Internal Medicine

## 2024-03-23 ENCOUNTER — Encounter (HOSPITAL_BASED_OUTPATIENT_CLINIC_OR_DEPARTMENT_OTHER): Attending: Internal Medicine | Admitting: Internal Medicine

## 2024-03-23 DIAGNOSIS — Z923 Personal history of irradiation: Secondary | ICD-10-CM | POA: Insufficient documentation

## 2024-03-23 DIAGNOSIS — I890 Lymphedema, not elsewhere classified: Secondary | ICD-10-CM | POA: Diagnosis not present

## 2024-03-23 DIAGNOSIS — I5042 Chronic combined systolic (congestive) and diastolic (congestive) heart failure: Secondary | ICD-10-CM | POA: Diagnosis not present

## 2024-03-23 DIAGNOSIS — Z7901 Long term (current) use of anticoagulants: Secondary | ICD-10-CM | POA: Insufficient documentation

## 2024-03-23 DIAGNOSIS — Z8546 Personal history of malignant neoplasm of prostate: Secondary | ICD-10-CM | POA: Insufficient documentation

## 2024-03-23 DIAGNOSIS — I87333 Chronic venous hypertension (idiopathic) with ulcer and inflammation of bilateral lower extremity: Secondary | ICD-10-CM | POA: Diagnosis not present

## 2024-03-23 DIAGNOSIS — L97818 Non-pressure chronic ulcer of other part of right lower leg with other specified severity: Secondary | ICD-10-CM | POA: Diagnosis not present

## 2024-03-23 DIAGNOSIS — L97828 Non-pressure chronic ulcer of other part of left lower leg with other specified severity: Secondary | ICD-10-CM | POA: Insufficient documentation

## 2024-03-23 DIAGNOSIS — Z86718 Personal history of other venous thrombosis and embolism: Secondary | ICD-10-CM | POA: Diagnosis not present

## 2024-03-30 ENCOUNTER — Encounter (HOSPITAL_BASED_OUTPATIENT_CLINIC_OR_DEPARTMENT_OTHER): Admitting: Internal Medicine

## 2024-03-30 DIAGNOSIS — I87333 Chronic venous hypertension (idiopathic) with ulcer and inflammation of bilateral lower extremity: Secondary | ICD-10-CM

## 2024-03-30 DIAGNOSIS — L97828 Non-pressure chronic ulcer of other part of left lower leg with other specified severity: Secondary | ICD-10-CM

## 2024-03-30 DIAGNOSIS — L97818 Non-pressure chronic ulcer of other part of right lower leg with other specified severity: Secondary | ICD-10-CM

## 2024-03-30 DIAGNOSIS — I890 Lymphedema, not elsewhere classified: Secondary | ICD-10-CM | POA: Diagnosis not present

## 2024-04-06 ENCOUNTER — Encounter (HOSPITAL_BASED_OUTPATIENT_CLINIC_OR_DEPARTMENT_OTHER): Admitting: Internal Medicine

## 2024-04-06 DIAGNOSIS — I87333 Chronic venous hypertension (idiopathic) with ulcer and inflammation of bilateral lower extremity: Secondary | ICD-10-CM | POA: Diagnosis not present

## 2024-04-13 ENCOUNTER — Encounter (HOSPITAL_BASED_OUTPATIENT_CLINIC_OR_DEPARTMENT_OTHER): Admitting: Internal Medicine

## 2024-04-13 DIAGNOSIS — I87333 Chronic venous hypertension (idiopathic) with ulcer and inflammation of bilateral lower extremity: Secondary | ICD-10-CM

## 2024-04-13 DIAGNOSIS — I5042 Chronic combined systolic (congestive) and diastolic (congestive) heart failure: Secondary | ICD-10-CM

## 2024-04-13 DIAGNOSIS — L97828 Non-pressure chronic ulcer of other part of left lower leg with other specified severity: Secondary | ICD-10-CM

## 2024-04-13 DIAGNOSIS — L97818 Non-pressure chronic ulcer of other part of right lower leg with other specified severity: Secondary | ICD-10-CM | POA: Diagnosis not present

## 2024-04-20 ENCOUNTER — Encounter (HOSPITAL_BASED_OUTPATIENT_CLINIC_OR_DEPARTMENT_OTHER): Attending: Internal Medicine | Admitting: Internal Medicine

## 2024-04-20 DIAGNOSIS — L97828 Non-pressure chronic ulcer of other part of left lower leg with other specified severity: Secondary | ICD-10-CM | POA: Diagnosis not present

## 2024-04-20 DIAGNOSIS — I5042 Chronic combined systolic (congestive) and diastolic (congestive) heart failure: Secondary | ICD-10-CM | POA: Diagnosis not present

## 2024-04-20 DIAGNOSIS — I890 Lymphedema, not elsewhere classified: Secondary | ICD-10-CM | POA: Diagnosis not present

## 2024-04-20 DIAGNOSIS — I87333 Chronic venous hypertension (idiopathic) with ulcer and inflammation of bilateral lower extremity: Secondary | ICD-10-CM

## 2024-04-20 DIAGNOSIS — L97818 Non-pressure chronic ulcer of other part of right lower leg with other specified severity: Secondary | ICD-10-CM

## 2024-04-27 ENCOUNTER — Encounter (HOSPITAL_BASED_OUTPATIENT_CLINIC_OR_DEPARTMENT_OTHER): Admitting: Internal Medicine

## 2024-04-27 DIAGNOSIS — I890 Lymphedema, not elsewhere classified: Secondary | ICD-10-CM

## 2024-04-27 DIAGNOSIS — L97828 Non-pressure chronic ulcer of other part of left lower leg with other specified severity: Secondary | ICD-10-CM

## 2024-04-27 DIAGNOSIS — I87333 Chronic venous hypertension (idiopathic) with ulcer and inflammation of bilateral lower extremity: Secondary | ICD-10-CM

## 2024-04-27 DIAGNOSIS — L97818 Non-pressure chronic ulcer of other part of right lower leg with other specified severity: Secondary | ICD-10-CM | POA: Diagnosis not present

## 2024-05-02 ENCOUNTER — Ambulatory Visit: Admitting: Podiatry

## 2024-05-02 DIAGNOSIS — B351 Tinea unguium: Secondary | ICD-10-CM | POA: Diagnosis not present

## 2024-05-02 DIAGNOSIS — M79674 Pain in right toe(s): Secondary | ICD-10-CM

## 2024-05-02 DIAGNOSIS — L84 Corns and callosities: Secondary | ICD-10-CM

## 2024-05-02 DIAGNOSIS — M79675 Pain in left toe(s): Secondary | ICD-10-CM | POA: Diagnosis not present

## 2024-05-02 NOTE — Progress Notes (Signed)
 Subjective:   Patient ID: [name], male   DOB: 78 y.o.   MRN: [id number]   HPI Chief Complaint  Patient presents with   Nail Problem    Pt stated that he just needs his nails trimmed     78 year old male presents the [place] above concerns.  States his nails are thick and elongated he cannot do them himself may cause discomfort.  He also gets a corn on the right third toe causing pain.  Does not report any open lesions to his toes.  Still going to wound care center for wounds, lymphedema.  He has wraps on his legs.  Review of Systems  All other systems reviewed and are negative.  Past Medical History:  Diagnosis Date   Anemia    Arthritis    CKD STAGE 3A    PER DR. MITZIE CONNOR ON 01/10/2021 OFFICE VISIT   Difficulty sleeping    DVT (deep venous thrombosis) (HCC)    rt leg 3 yrs ago   Hypertension    Leg cramps    Lymphedema    Prostate cancer (HCC)    Swelling of lower extremity    RT LEG   Varicose veins    both legs when he was 78 yrs old. 01/25/2021   Wears glasses    wears glasses for driving.  01/25/2021    Past Surgical History:  Procedure Laterality Date   HERNIA REPAIR  05/19/2010   INGUINAL   HYDROCELE EXCISION Left 09/18/2021   Procedure: HYDROCELECTOMY ADULT;  Surgeon: Alvaro Hummer, MD;  Location: [facility];  Service: Urology;  Laterality: Left;  1 HR   INGUINAL HERNIA REPAIR Left 01/29/2021   Procedure: LAPAROSCOPIC LEFT INGUINAL HERNIA REPAIR WITH MESH;  Surgeon: Stechschulte, Deward PARAS, MD;  Location: [facility];  Service: General;  Laterality: Left;   LYMPHADENECTOMY Bilateral 12/30/2013   Procedure: PELVIC LYMPH NODE DISSECTION;  Surgeon: Hummer Alvaro, MD;  Location: [facility];  Service: Urology;  Laterality: Bilateral;   NASAL SINUS SURGERY     ORCHIOPEXY Left 09/18/2021   Procedure: ORCHIOPEXY ADULT;  Surgeon: Alvaro Hummer, MD;  Location: [facility];  Service: Urology;  Laterality: Left;   PROSTATE BIOPSY     ROBOT ASSISTED  LAPAROSCOPIC RADICAL PROSTATECTOMY N/A 12/30/2013   Procedure: ROBOTIC ASSISTED LAPAROSCOPIC RADICAL PROSTATECTOMY WITH INDOCYANINE GREEN  DYE;  Surgeon: Hummer Alvaro, MD;  Location: [facility];  Service: Urology;  Laterality: N/A;   VEIN SURGERY     RT LEG  had surgery 36 yrs ago. 01/25/2021    Current Medications[1]  Allergies[2]        Objective:  Physical Exam  General: AAO x3, NAD  Dermatological: Nails are hypertrophic, dystrophic, brittle, discolored, elongated 10. No surrounding redness or drainage. Tenderness nails 1-5 bilaterally.  Hyperkeratotic lesion distal right third toe without any underlying ulceration, drainage or signs of infection.  Vascular: Immediate cap refill time noted to the toes.  There is a bandage on the foot, legs which limits evaluation.  Neruologic: Grossly intact via light touch bilateral.   Musculoskeletal: Digital contractures present.       Assessment:   Symptomatic onychosis, hyperkeratotic lesion     Plan:  -Treatment options discussed including all alternatives, risks, and complications -Etiology of symptoms were discussed -Sharply debrided nails x 10 without any complications or bleeding -Sharply debrided hyperkeratotic lesion x 1 without any complications or bleeding -Exam limited given the bandages.  Continue to follow with wound care center.  Return in about 3 months (  around 07/31/2024) for nail trim.  [name] DPM         [1]  Current Outpatient Medications:    acetaminophen  (TYLENOL ) 500 MG tablet, Take 500 mg by mouth daily as needed (pain)., Disp: , Rfl:    amLODipine  (NORVASC ) 5 MG tablet, Take 5 mg by mouth every morning., Disp: , Rfl:    apixaban  (ELIQUIS ) 5 MG TABS tablet, Take 2 tablets (10 mg total) by mouth 2 (two) times daily for 7 days., Disp: 28 tablet, Rfl: 0   apixaban  (ELIQUIS ) 5 MG TABS tablet, Take 1 tablet (5 mg total) by mouth 2 (two) times daily., Disp: 60 tablet, Rfl: 3   ferrous sulfate  325  (65 FE) MG tablet, Take 325 mg by mouth daily with breakfast., Disp: , Rfl:    folic acid  (FOLVITE ) 1 MG tablet, Take 1 tablet (1 mg total) by mouth daily., Disp: 30 tablet, Rfl: 0   hydrALAZINE  (APRESOLINE ) 50 MG tablet, Take 1 tablet (50 mg total) by mouth 3 (three) times daily., Disp: 90 tablet, Rfl: 1   tiZANidine (ZANAFLEX) 4 MG tablet, Take 4 mg by mouth every 8 (eight) hours as needed for muscle spasms., Disp: , Rfl:    torsemide  (DEMADEX ) 20 MG tablet, Take 1 tablet (20 mg total) by mouth daily., Disp: 30 tablet, Rfl: 1   vitamin B-12 (VITAMIN B12) 500 MCG tablet, Take 1 tablet (500 mcg total) by mouth daily., Disp: 30 tablet, Rfl: 0 [2]  Allergies Allergen Reactions   Cephalexin  Nausea And Vomiting    patient only took one pill and stated that it made him vomit   Mometasone Other (See Comments)    patient states the cream made sores on his leg

## 2024-05-04 ENCOUNTER — Encounter (HOSPITAL_BASED_OUTPATIENT_CLINIC_OR_DEPARTMENT_OTHER): Admitting: Internal Medicine

## 2024-05-04 DIAGNOSIS — I87333 Chronic venous hypertension (idiopathic) with ulcer and inflammation of bilateral lower extremity: Secondary | ICD-10-CM | POA: Diagnosis not present

## 2024-05-10 ENCOUNTER — Encounter (HOSPITAL_BASED_OUTPATIENT_CLINIC_OR_DEPARTMENT_OTHER): Admitting: General Surgery

## 2024-05-10 DIAGNOSIS — I87333 Chronic venous hypertension (idiopathic) with ulcer and inflammation of bilateral lower extremity: Secondary | ICD-10-CM | POA: Diagnosis not present

## 2024-05-18 ENCOUNTER — Encounter (HOSPITAL_BASED_OUTPATIENT_CLINIC_OR_DEPARTMENT_OTHER): Admitting: Internal Medicine

## 2024-05-18 DIAGNOSIS — L97828 Non-pressure chronic ulcer of other part of left lower leg with other specified severity: Secondary | ICD-10-CM

## 2024-05-18 DIAGNOSIS — I87333 Chronic venous hypertension (idiopathic) with ulcer and inflammation of bilateral lower extremity: Secondary | ICD-10-CM | POA: Diagnosis not present

## 2024-05-18 DIAGNOSIS — I890 Lymphedema, not elsewhere classified: Secondary | ICD-10-CM

## 2024-05-18 DIAGNOSIS — L97818 Non-pressure chronic ulcer of other part of right lower leg with other specified severity: Secondary | ICD-10-CM

## 2024-05-25 ENCOUNTER — Encounter (HOSPITAL_BASED_OUTPATIENT_CLINIC_OR_DEPARTMENT_OTHER): Attending: Internal Medicine | Admitting: Internal Medicine

## 2024-05-25 DIAGNOSIS — I5042 Chronic combined systolic (congestive) and diastolic (congestive) heart failure: Secondary | ICD-10-CM | POA: Insufficient documentation

## 2024-05-25 DIAGNOSIS — L97818 Non-pressure chronic ulcer of other part of right lower leg with other specified severity: Secondary | ICD-10-CM | POA: Insufficient documentation

## 2024-05-25 DIAGNOSIS — I890 Lymphedema, not elsewhere classified: Secondary | ICD-10-CM | POA: Diagnosis not present

## 2024-05-25 DIAGNOSIS — L97828 Non-pressure chronic ulcer of other part of left lower leg with other specified severity: Secondary | ICD-10-CM | POA: Diagnosis not present

## 2024-05-25 DIAGNOSIS — I87333 Chronic venous hypertension (idiopathic) with ulcer and inflammation of bilateral lower extremity: Secondary | ICD-10-CM | POA: Diagnosis present

## 2024-06-01 ENCOUNTER — Encounter (HOSPITAL_BASED_OUTPATIENT_CLINIC_OR_DEPARTMENT_OTHER): Admitting: Internal Medicine

## 2024-06-01 DIAGNOSIS — I87333 Chronic venous hypertension (idiopathic) with ulcer and inflammation of bilateral lower extremity: Secondary | ICD-10-CM | POA: Diagnosis not present

## 2024-06-08 ENCOUNTER — Encounter (HOSPITAL_BASED_OUTPATIENT_CLINIC_OR_DEPARTMENT_OTHER): Admitting: Internal Medicine

## 2024-06-15 ENCOUNTER — Encounter (HOSPITAL_BASED_OUTPATIENT_CLINIC_OR_DEPARTMENT_OTHER): Admitting: Internal Medicine

## 2024-06-15 DIAGNOSIS — I87333 Chronic venous hypertension (idiopathic) with ulcer and inflammation of bilateral lower extremity: Secondary | ICD-10-CM | POA: Diagnosis not present

## 2024-06-15 DIAGNOSIS — I890 Lymphedema, not elsewhere classified: Secondary | ICD-10-CM

## 2024-06-15 DIAGNOSIS — L97818 Non-pressure chronic ulcer of other part of right lower leg with other specified severity: Secondary | ICD-10-CM

## 2024-06-15 DIAGNOSIS — L97828 Non-pressure chronic ulcer of other part of left lower leg with other specified severity: Secondary | ICD-10-CM

## 2024-06-22 ENCOUNTER — Encounter (HOSPITAL_BASED_OUTPATIENT_CLINIC_OR_DEPARTMENT_OTHER): Admitting: Internal Medicine

## 2024-06-29 ENCOUNTER — Encounter (HOSPITAL_BASED_OUTPATIENT_CLINIC_OR_DEPARTMENT_OTHER): Admitting: Internal Medicine

## 2024-07-06 ENCOUNTER — Encounter (HOSPITAL_BASED_OUTPATIENT_CLINIC_OR_DEPARTMENT_OTHER): Admitting: Internal Medicine

## 2024-07-13 ENCOUNTER — Encounter (HOSPITAL_BASED_OUTPATIENT_CLINIC_OR_DEPARTMENT_OTHER): Admitting: Internal Medicine

## 2024-08-01 ENCOUNTER — Ambulatory Visit: Admitting: Podiatry
